# Patient Record
Sex: Male | Born: 1950
Health system: Southern US, Community
[De-identification: ages and names within clinical notes are randomized; demographics above are authoritative.]

## PROBLEM LIST (undated history)

## (undated) DIAGNOSIS — I4891 Unspecified atrial fibrillation: Secondary | ICD-10-CM

## (undated) DIAGNOSIS — G4710 Hypersomnia, unspecified: Secondary | ICD-10-CM

## (undated) DIAGNOSIS — I2699 Other pulmonary embolism without acute cor pulmonale: Secondary | ICD-10-CM

## (undated) DIAGNOSIS — M67919 Unspecified disorder of synovium and tendon, unspecified shoulder: Secondary | ICD-10-CM

## (undated) DIAGNOSIS — Z22322 Carrier or suspected carrier of Methicillin resistant Staphylococcus aureus: Secondary | ICD-10-CM

## (undated) DIAGNOSIS — G4733 Obstructive sleep apnea (adult) (pediatric): Secondary | ICD-10-CM

## (undated) DIAGNOSIS — E039 Hypothyroidism, unspecified: Secondary | ICD-10-CM

## (undated) DIAGNOSIS — Z9289 Personal history of other medical treatment: Secondary | ICD-10-CM

## (undated) DIAGNOSIS — G4730 Sleep apnea, unspecified: Secondary | ICD-10-CM

## (undated) DIAGNOSIS — G2581 Restless legs syndrome: Secondary | ICD-10-CM

## (undated) DIAGNOSIS — L409 Psoriasis, unspecified: Secondary | ICD-10-CM

## (undated) DIAGNOSIS — I82409 Acute embolism and thrombosis of unspecified deep veins of unspecified lower extremity: Secondary | ICD-10-CM

## (undated) DIAGNOSIS — K219 Gastro-esophageal reflux disease without esophagitis: Secondary | ICD-10-CM

## (undated) DIAGNOSIS — I10 Essential (primary) hypertension: Secondary | ICD-10-CM

## (undated) DIAGNOSIS — E782 Mixed hyperlipidemia: Secondary | ICD-10-CM

## (undated) DIAGNOSIS — J449 Chronic obstructive pulmonary disease, unspecified: Secondary | ICD-10-CM

## (undated) DIAGNOSIS — T7840XA Allergy, unspecified, initial encounter: Secondary | ICD-10-CM

## (undated) DIAGNOSIS — K635 Polyp of colon: Secondary | ICD-10-CM

## (undated) DIAGNOSIS — M1990 Unspecified osteoarthritis, unspecified site: Secondary | ICD-10-CM

## (undated) DIAGNOSIS — I219 Acute myocardial infarction, unspecified: Secondary | ICD-10-CM

## (undated) DIAGNOSIS — C4491 Basal cell carcinoma of skin, unspecified: Secondary | ICD-10-CM

## (undated) DIAGNOSIS — N189 Chronic kidney disease, unspecified: Secondary | ICD-10-CM

## (undated) DIAGNOSIS — I639 Cerebral infarction, unspecified: Secondary | ICD-10-CM

## (undated) DIAGNOSIS — J189 Pneumonia, unspecified organism: Secondary | ICD-10-CM

## (undated) DIAGNOSIS — E119 Type 2 diabetes mellitus without complications: Secondary | ICD-10-CM

## (undated) DIAGNOSIS — R519 Headache, unspecified: Secondary | ICD-10-CM

## (undated) DIAGNOSIS — I2510 Atherosclerotic heart disease of native coronary artery without angina pectoris: Secondary | ICD-10-CM

## (undated) DIAGNOSIS — R51 Headache: Secondary | ICD-10-CM

## (undated) DIAGNOSIS — J45909 Unspecified asthma, uncomplicated: Secondary | ICD-10-CM

## (undated) DIAGNOSIS — H269 Unspecified cataract: Secondary | ICD-10-CM

## (undated) DIAGNOSIS — M009 Pyogenic arthritis, unspecified: Secondary | ICD-10-CM

## (undated) DIAGNOSIS — G47419 Narcolepsy without cataplexy: Secondary | ICD-10-CM

## (undated) HISTORY — DX: Chronic kidney disease, unspecified: N18.9

## (undated) HISTORY — DX: Mixed hyperlipidemia: E78.2

## (undated) HISTORY — DX: Unspecified osteoarthritis, unspecified site: M19.90

## (undated) HISTORY — DX: Obstructive sleep apnea (adult) (pediatric): G47.33

## (undated) HISTORY — DX: Pyogenic arthritis, unspecified: M00.9

## (undated) HISTORY — DX: Basal cell carcinoma of skin, unspecified: C44.91

## (undated) HISTORY — DX: Type 2 diabetes mellitus without complications: E11.9

## (undated) HISTORY — DX: Gastro-esophageal reflux disease without esophagitis: K21.9

## (undated) HISTORY — DX: Acute embolism and thrombosis of unspecified deep veins of unspecified lower extremity: I82.409

## (undated) HISTORY — DX: Atherosclerotic heart disease of native coronary artery without angina pectoris: I25.10

## (undated) HISTORY — PX: HIP SURGERY: SHX245

## (undated) HISTORY — PX: LUMBAR DISC SURGERY: SHX700

## (undated) HISTORY — DX: Restless legs syndrome: G25.81

## (undated) HISTORY — PX: COLONOSCOPY: SHX174

## (undated) HISTORY — PX: CYST REMOVAL TRUNK: SHX6283

## (undated) HISTORY — DX: Polyp of colon: K63.5

## (undated) HISTORY — DX: Essential (primary) hypertension: I10

## (undated) HISTORY — PX: BACK SURGERY: SHX140

## (undated) HISTORY — DX: Carrier or suspected carrier of methicillin resistant Staphylococcus aureus: Z22.322

## (undated) HISTORY — PX: KNEE SURGERY: SHX244

## (undated) HISTORY — DX: Unspecified atrial fibrillation: I48.91

## (undated) HISTORY — DX: Unspecified cataract: H26.9

## (undated) HISTORY — PX: TONSILLECTOMY: SUR1361

## (undated) HISTORY — DX: Allergy, unspecified, initial encounter: T78.40XA

## (undated) HISTORY — DX: Acute myocardial infarction, unspecified: I21.9

## (undated) HISTORY — DX: Chronic obstructive pulmonary disease, unspecified: J44.9

## (undated) HISTORY — PX: SPINE SURGERY: SHX786

## (undated) HISTORY — PX: CATARACT EXTRACTION: SUR2

## (undated) HISTORY — DX: Sleep apnea, unspecified: G47.30

## (undated) HISTORY — PX: JOINT REPLACEMENT: SHX530

## (undated) HISTORY — DX: Hypersomnia, unspecified: G47.10

## (undated) HISTORY — DX: Morbid (severe) obesity due to excess calories: E66.01

## (undated) HISTORY — DX: Cerebral infarction, unspecified: I63.9

## (undated) HISTORY — PX: WRIST SURGERY: SHX841

## (undated) HISTORY — DX: Other pulmonary embolism without acute cor pulmonale: I26.99

---

## 1998-11-02 ENCOUNTER — Encounter (INDEPENDENT_AMBULATORY_CARE_PROVIDER_SITE_OTHER): Payer: Self-pay

## 1998-11-02 ENCOUNTER — Ambulatory Visit (HOSPITAL_COMMUNITY): Admission: RE | Admit: 1998-11-02 | Discharge: 1998-11-02 | Payer: Self-pay | Admitting: Gastroenterology

## 2000-05-25 ENCOUNTER — Encounter: Admission: RE | Admit: 2000-05-25 | Discharge: 2000-05-25 | Payer: Self-pay | Admitting: Orthopedic Surgery

## 2000-05-25 ENCOUNTER — Encounter: Payer: Self-pay | Admitting: Orthopedic Surgery

## 2000-08-23 ENCOUNTER — Encounter: Payer: Self-pay | Admitting: Family Medicine

## 2000-08-23 ENCOUNTER — Ambulatory Visit (HOSPITAL_COMMUNITY): Admission: RE | Admit: 2000-08-23 | Discharge: 2000-08-23 | Payer: Self-pay | Admitting: Family Medicine

## 2001-01-02 HISTORY — PX: TOTAL KNEE ARTHROPLASTY: SHX125

## 2001-02-07 ENCOUNTER — Ambulatory Visit (HOSPITAL_COMMUNITY): Admission: RE | Admit: 2001-02-07 | Discharge: 2001-02-07 | Payer: Self-pay | Admitting: Family Medicine

## 2001-02-07 ENCOUNTER — Encounter: Payer: Self-pay | Admitting: Family Medicine

## 2001-04-23 ENCOUNTER — Encounter: Payer: Self-pay | Admitting: *Deleted

## 2001-04-23 ENCOUNTER — Encounter: Admission: RE | Admit: 2001-04-23 | Discharge: 2001-04-23 | Payer: Self-pay | Admitting: *Deleted

## 2001-05-01 ENCOUNTER — Observation Stay (HOSPITAL_COMMUNITY): Admission: RE | Admit: 2001-05-01 | Discharge: 2001-05-02 | Payer: Self-pay | Admitting: Family Medicine

## 2001-05-01 ENCOUNTER — Encounter: Payer: Self-pay | Admitting: Family Medicine

## 2001-05-02 ENCOUNTER — Encounter: Payer: Self-pay | Admitting: Family Medicine

## 2001-07-04 ENCOUNTER — Ambulatory Visit (HOSPITAL_COMMUNITY): Admission: RE | Admit: 2001-07-04 | Discharge: 2001-07-04 | Payer: Self-pay | Admitting: Family Medicine

## 2001-07-04 ENCOUNTER — Encounter: Payer: Self-pay | Admitting: Family Medicine

## 2001-07-19 ENCOUNTER — Inpatient Hospital Stay (HOSPITAL_COMMUNITY): Admission: RE | Admit: 2001-07-19 | Discharge: 2001-07-23 | Payer: Self-pay | Admitting: Specialist

## 2001-07-22 ENCOUNTER — Encounter: Payer: Self-pay | Admitting: Specialist

## 2002-01-02 DIAGNOSIS — I2699 Other pulmonary embolism without acute cor pulmonale: Secondary | ICD-10-CM

## 2002-01-02 HISTORY — DX: Other pulmonary embolism without acute cor pulmonale: I26.99

## 2002-01-31 ENCOUNTER — Inpatient Hospital Stay (HOSPITAL_COMMUNITY): Admission: EM | Admit: 2002-01-31 | Discharge: 2002-02-03 | Payer: Self-pay | Admitting: Specialist

## 2002-02-03 ENCOUNTER — Encounter: Payer: Self-pay | Admitting: Specialist

## 2002-02-04 ENCOUNTER — Encounter (HOSPITAL_COMMUNITY): Admission: RE | Admit: 2002-02-04 | Discharge: 2002-03-06 | Payer: Self-pay | Admitting: Infectious Diseases

## 2002-03-05 ENCOUNTER — Inpatient Hospital Stay (HOSPITAL_COMMUNITY): Admission: EM | Admit: 2002-03-05 | Discharge: 2002-03-07 | Payer: Self-pay | Admitting: Emergency Medicine

## 2002-03-05 ENCOUNTER — Encounter: Payer: Self-pay | Admitting: Emergency Medicine

## 2002-03-07 ENCOUNTER — Encounter (HOSPITAL_COMMUNITY): Admission: RE | Admit: 2002-03-07 | Discharge: 2002-04-06 | Payer: Self-pay | Admitting: Infectious Diseases

## 2002-03-07 ENCOUNTER — Encounter: Payer: Self-pay | Admitting: *Deleted

## 2002-03-13 ENCOUNTER — Encounter: Admission: RE | Admit: 2002-03-13 | Discharge: 2002-03-13 | Payer: Self-pay | Admitting: Infectious Diseases

## 2002-03-18 ENCOUNTER — Ambulatory Visit (HOSPITAL_COMMUNITY): Admission: RE | Admit: 2002-03-18 | Discharge: 2002-03-18 | Payer: Self-pay | Admitting: Infectious Diseases

## 2002-03-19 ENCOUNTER — Encounter: Payer: Self-pay | Admitting: Family Medicine

## 2002-03-19 ENCOUNTER — Inpatient Hospital Stay (HOSPITAL_COMMUNITY): Admission: RE | Admit: 2002-03-19 | Discharge: 2002-03-24 | Payer: Self-pay | Admitting: Family Medicine

## 2002-04-21 ENCOUNTER — Encounter: Admission: RE | Admit: 2002-04-21 | Discharge: 2002-04-21 | Payer: Self-pay | Admitting: Oncology

## 2002-05-12 ENCOUNTER — Ambulatory Visit (HOSPITAL_COMMUNITY): Admission: RE | Admit: 2002-05-12 | Discharge: 2002-05-12 | Payer: Self-pay | Admitting: Specialist

## 2002-05-15 ENCOUNTER — Encounter: Admission: RE | Admit: 2002-05-15 | Discharge: 2002-05-15 | Payer: Self-pay | Admitting: Infectious Diseases

## 2002-05-21 ENCOUNTER — Ambulatory Visit (HOSPITAL_COMMUNITY): Admission: RE | Admit: 2002-05-21 | Discharge: 2002-05-21 | Payer: Self-pay | Admitting: Specialist

## 2002-05-21 ENCOUNTER — Encounter: Payer: Self-pay | Admitting: Specialist

## 2002-05-27 ENCOUNTER — Encounter: Payer: Self-pay | Admitting: Specialist

## 2002-05-27 ENCOUNTER — Ambulatory Visit (HOSPITAL_COMMUNITY): Admission: RE | Admit: 2002-05-27 | Discharge: 2002-05-27 | Payer: Self-pay | Admitting: Specialist

## 2002-08-22 ENCOUNTER — Inpatient Hospital Stay (HOSPITAL_COMMUNITY): Admission: AD | Admit: 2002-08-22 | Discharge: 2002-08-24 | Payer: Self-pay | Admitting: Family Medicine

## 2002-08-22 ENCOUNTER — Encounter: Payer: Self-pay | Admitting: Family Medicine

## 2002-10-09 ENCOUNTER — Encounter: Admission: RE | Admit: 2002-10-09 | Discharge: 2002-10-09 | Payer: Self-pay | Admitting: Infectious Diseases

## 2003-01-03 DIAGNOSIS — I82409 Acute embolism and thrombosis of unspecified deep veins of unspecified lower extremity: Secondary | ICD-10-CM

## 2003-01-03 HISTORY — DX: Acute embolism and thrombosis of unspecified deep veins of unspecified lower extremity: I82.409

## 2003-01-03 HISTORY — PX: TOTAL KNEE REVISION: SHX996

## 2003-04-20 ENCOUNTER — Encounter: Admission: RE | Admit: 2003-04-20 | Discharge: 2003-04-20 | Payer: Self-pay | Admitting: Infectious Diseases

## 2003-06-02 ENCOUNTER — Inpatient Hospital Stay (HOSPITAL_COMMUNITY): Admission: RE | Admit: 2003-06-02 | Discharge: 2003-06-06 | Payer: Self-pay | Admitting: Orthopedic Surgery

## 2003-06-02 ENCOUNTER — Encounter (INDEPENDENT_AMBULATORY_CARE_PROVIDER_SITE_OTHER): Payer: Self-pay | Admitting: *Deleted

## 2003-08-13 ENCOUNTER — Inpatient Hospital Stay (HOSPITAL_COMMUNITY): Admission: RE | Admit: 2003-08-13 | Discharge: 2003-08-17 | Payer: Self-pay | Admitting: Orthopedic Surgery

## 2004-05-30 ENCOUNTER — Emergency Department (HOSPITAL_COMMUNITY): Admission: EM | Admit: 2004-05-30 | Discharge: 2004-05-30 | Payer: Self-pay | Admitting: Emergency Medicine

## 2005-01-03 ENCOUNTER — Ambulatory Visit (HOSPITAL_COMMUNITY): Admission: RE | Admit: 2005-01-03 | Discharge: 2005-01-03 | Payer: Self-pay | Admitting: Family Medicine

## 2005-08-07 ENCOUNTER — Ambulatory Visit: Admission: RE | Admit: 2005-08-07 | Discharge: 2005-08-07 | Payer: Self-pay | Admitting: Family Medicine

## 2005-08-09 ENCOUNTER — Ambulatory Visit: Payer: Self-pay | Admitting: Pulmonary Disease

## 2008-07-22 ENCOUNTER — Inpatient Hospital Stay (HOSPITAL_COMMUNITY): Admission: RE | Admit: 2008-07-22 | Discharge: 2008-07-23 | Payer: Self-pay | Admitting: Neurosurgery

## 2009-11-09 ENCOUNTER — Encounter (HOSPITAL_COMMUNITY)
Admission: RE | Admit: 2009-11-09 | Discharge: 2009-12-09 | Payer: Self-pay | Source: Home / Self Care | Attending: Family Medicine | Admitting: Family Medicine

## 2009-11-30 ENCOUNTER — Ambulatory Visit: Payer: Self-pay | Admitting: Cardiology

## 2009-11-30 ENCOUNTER — Ambulatory Visit (HOSPITAL_COMMUNITY): Admission: RE | Admit: 2009-11-30 | Discharge: 2009-11-30 | Payer: Self-pay | Admitting: Cardiology

## 2009-11-30 DIAGNOSIS — E1165 Type 2 diabetes mellitus with hyperglycemia: Secondary | ICD-10-CM | POA: Insufficient documentation

## 2009-11-30 DIAGNOSIS — E785 Hyperlipidemia, unspecified: Secondary | ICD-10-CM | POA: Insufficient documentation

## 2009-11-30 DIAGNOSIS — I200 Unstable angina: Secondary | ICD-10-CM | POA: Insufficient documentation

## 2009-11-30 LAB — CONVERTED CEMR LAB
Basophils Absolute: 0.1 10*3/uL (ref 0.0–0.1)
Basophils Relative: 1 % (ref 0–1)
CO2: 26 meq/L (ref 19–32)
Calcium: 10.4 mg/dL (ref 8.4–10.5)
Creatinine, Ser: 1.28 mg/dL (ref 0.40–1.50)
Eosinophils Relative: 3 % (ref 0–5)
Glucose, Bld: 113 mg/dL — ABNORMAL HIGH (ref 70–99)
HCT: 41.6 % (ref 39.0–52.0)
Hemoglobin: 13.6 g/dL (ref 13.0–17.0)
MCHC: 32.7 g/dL (ref 30.0–36.0)
MCV: 90.8 fL (ref 78.0–100.0)
Monocytes Absolute: 1.1 10*3/uL — ABNORMAL HIGH (ref 0.1–1.0)
RDW: 13.9 % (ref 11.5–15.5)

## 2009-12-01 ENCOUNTER — Encounter: Payer: Self-pay | Admitting: Cardiology

## 2009-12-03 ENCOUNTER — Ambulatory Visit: Payer: Self-pay | Admitting: Cardiology

## 2009-12-03 ENCOUNTER — Inpatient Hospital Stay (HOSPITAL_BASED_OUTPATIENT_CLINIC_OR_DEPARTMENT_OTHER)
Admission: RE | Admit: 2009-12-03 | Discharge: 2009-12-03 | Payer: Self-pay | Source: Home / Self Care | Admitting: Cardiology

## 2009-12-06 ENCOUNTER — Telehealth: Payer: Self-pay | Admitting: Adult Health

## 2009-12-08 ENCOUNTER — Telehealth (INDEPENDENT_AMBULATORY_CARE_PROVIDER_SITE_OTHER): Payer: Self-pay

## 2009-12-16 ENCOUNTER — Encounter: Payer: Self-pay | Admitting: Cardiology

## 2009-12-16 ENCOUNTER — Ambulatory Visit: Payer: Self-pay | Admitting: Cardiology

## 2009-12-16 ENCOUNTER — Encounter (INDEPENDENT_AMBULATORY_CARE_PROVIDER_SITE_OTHER): Payer: Self-pay | Admitting: *Deleted

## 2009-12-16 DIAGNOSIS — I10 Essential (primary) hypertension: Secondary | ICD-10-CM | POA: Insufficient documentation

## 2009-12-16 DIAGNOSIS — I2510 Atherosclerotic heart disease of native coronary artery without angina pectoris: Secondary | ICD-10-CM | POA: Insufficient documentation

## 2009-12-16 DIAGNOSIS — I25119 Atherosclerotic heart disease of native coronary artery with unspecified angina pectoris: Secondary | ICD-10-CM | POA: Insufficient documentation

## 2009-12-17 ENCOUNTER — Encounter: Payer: Self-pay | Admitting: Cardiology

## 2010-01-12 ENCOUNTER — Telehealth (INDEPENDENT_AMBULATORY_CARE_PROVIDER_SITE_OTHER): Payer: Self-pay

## 2010-01-20 ENCOUNTER — Encounter: Payer: Self-pay | Admitting: Cardiology

## 2010-01-21 ENCOUNTER — Encounter: Payer: Self-pay | Admitting: Cardiology

## 2010-01-21 LAB — CONVERTED CEMR LAB
Calcium: 10.1 mg/dL (ref 8.4–10.5)
Glucose, Bld: 103 mg/dL — ABNORMAL HIGH (ref 70–99)
Potassium: 4.7 meq/L (ref 3.5–5.3)
Sodium: 138 meq/L (ref 135–145)
Total CHOL/HDL Ratio: 3.4

## 2010-01-24 LAB — CONVERTED CEMR LAB
ALT: 21 units/L (ref 0–53)
Bilirubin, Direct: <0.1 mg/dL (ref 0.0–0.3)
Total Bilirubin: 0.3 mg/dL (ref 0.3–1.2)

## 2010-01-25 ENCOUNTER — Encounter: Payer: Self-pay | Admitting: Cardiology

## 2010-02-02 ENCOUNTER — Encounter: Payer: Self-pay | Admitting: Cardiology

## 2010-02-02 ENCOUNTER — Ambulatory Visit: Admit: 2010-02-02 | Payer: Self-pay | Admitting: Cardiology

## 2010-02-02 ENCOUNTER — Ambulatory Visit (INDEPENDENT_AMBULATORY_CARE_PROVIDER_SITE_OTHER): Payer: Medicare Other | Admitting: Cardiology

## 2010-02-02 DIAGNOSIS — I10 Essential (primary) hypertension: Secondary | ICD-10-CM

## 2010-02-02 DIAGNOSIS — E782 Mixed hyperlipidemia: Secondary | ICD-10-CM

## 2010-02-02 DIAGNOSIS — I2510 Atherosclerotic heart disease of native coronary artery without angina pectoris: Secondary | ICD-10-CM

## 2010-02-03 NOTE — Letter (Signed)
Summary: [place] [facility] Work Engineer, agricultural at [employer]  [address], [place] [address]   Phone: [phone]  Fax: [phone]     December 16, 2009 MRN: [id number]   Paul Baker [address] [place], [place]  [address]      YOUR LAB WORK IS DUE   January 20, 2010  Please go to [facility], located across the street from [facility] on the second floor.  Hours are Monday - Friday 7am until 7:30pm         Saturday 8am until 12noon    _X_  DO NOT EAT OR DRINK AFTER MIDNIGHT EVENING PRIOR TO LABWORK

## 2010-02-03 NOTE — Letter (Signed)
Summary: [facility]  [facility]   Imported By: Faythe Ghee 12/01/2009 11:01:26  _____________________________________________________________________  External Attachment:    Type:   Image     Comment:   External Document

## 2010-02-03 NOTE — Progress Notes (Signed)
**Note De-Identified Bich Mchaney Obfuscation** Summary: samples of crestor  Phone Note Call from Patient Call back at Home Phone [phone]   Caller: pt Reason for Call: Talk to Nurse Summary of Call: pt would like samples of crestor Initial call taken by: Faythe Ghee,  January 12, 2010 2:09 PM  Follow-up for Phone Call        Left message for pt. to come by office to pick up samples of 5mg  Crestor. Follow-up by: Larita Fife Miriam Liles LPN,  January 12, 2010 4:38 PM    Prescriptions: CRESTOR 5 MG TABS (ROSUVASTATIN CALCIUM) Take one tablet by mouth daily.  #42 x 0   Entered by:   Larita Fife Lonita Debes LPN   Authorized by:   Loreli Slot, MD, [facility]   Signed by:   Larita Fife Efe Fazzino LPN on 09/81/1914   Method used:   Samples Given   RxID:   [id number]  Lot # = [id number]   EXP= 10/13

## 2010-02-03 NOTE — Letter (Signed)
Summary: REFERRAL FORM  REFERRAL FORM   Imported By: Faythe Ghee 12/17/2009 14:44:45  _____________________________________________________________________  External Attachment:    Type:   Image     Comment:   External Document

## 2010-02-03 NOTE — Assessment & Plan Note (Signed)
Summary: eph post cath   Visit Type:  Follow-up Primary Provider:  Dr. Simone Curia   History of Present Illness: 60 year old male presents for followup. He was seen in consultation back in late November for further review and abnormal stress test, documented previously. This study showed subtle inferior ischemia with normal LVEF. He was referred for a diagnostic cardiac catheterization, reviewed below. He was found to have a diseased, nondominant RCA, which was felt to be best managed medically initially. He was started on Imdur and Crestor, and verapamil was discontinued in favor of Lopressor.  He is here for followup with his wife. He reports doing reasonably well. Still with shortness of breath on exertion, and occasional chest pain. Symptoms are better than before. We reviewed his cardiac catheterization report. We also discussed referral to cardiac rehabilitation. He is tolerating Crestor.  He seems motivated to increase his activity and lose weight. Also comfortable continuing on medical therapy at this point for CAD.  Current Medications (verified): 1)  Tricor 145 Mg Tabs (Fenofibrate) .... Take 1 Tab Daily 2)  Triamterene-Hctz 37.5-25 Mg Tabs (Triamterene-Hctz) .... Take 1 Tab Daily 3)  Losartan Potassium 100 Mg Tabs (Losartan Potassium) .... Take 1 Tab Daily 4)  Glyburide 5 Mg Tabs (Glyburide) .... Take 1 Tab Daily 5)  Requip 4 Mg Tabs (Ropinirole Hcl) .... Take 1 Tab At Bedtime 6)  Metformin Hcl 500 Mg Tabs (Metformin Hcl) .... 2 Tabs Am 1 Tab Noon Time 2 Tabs At Night 7)  Testosterone Injection .[name].. 1 Shot Every 2 Weeks 8)  Levitra 20 Mg Tabs (Vardenafil Hcl) .... Use As Needed 9)  Aspir-Low 81 Mg Tbec (Aspirin) .... Take 1 Tab Daily 10)  Fish Oil 1000 Mg Caps (Omega-3 Fatty Acids) .... Take 1 Cap Daily 11)  Biotin 1000 Mcg Tabs (Biotin) .... Take 1 Tab Daily 12)  Coenzyme Q10 200 Mg Tabs (Coenzyme Q10) .... Take 1 Tab Daily 13)  Vitamin C Cr 500 Mg Cr-Caps (Ascorbic Acid)  .... Take 1 Tab Daily 14)  Multivitamins  Tabs (Multiple Vitamin) .... Take 1 Tab Daily 15)  Vitamin D3 1000 Unit Caps (Cholecalciferol) .... Take 1 Tab Daily 16)  Nitroglycerin 0.2 Mg/hr Pt24 (Nitroglycerin) .... Apply 1 Patch Each Morning and Remove At Bedtime 17)  Crestor 5 Mg Tabs (Rosuvastatin Calcium) .... Take One Tablet By Mouth Daily. 18)  Imdur 30 Mg Xr24h-Tab (Isosorbide Mononitrate) .... Take 1 Tab Daily 19)  Lopressor 50 Mg Tabs (Metoprolol Tartrate) .... Take 1 Tab Two Times A Day  Allergies (verified): 1)  ! Codeine  Comments:  Nurse/Medical Assistant: patient brought med list they took patient off of zetia and verapamil stared on crestor 5mg  ,metoprolol 50 mg two times a day and imdur 30mg  [place]  Past History:  Past Medical History: Last updated: 11/30/2009 Arthritis Diabetes Type 2 Hyperlipidemia Hypertension Septic arthritis of left knee History of DVT and pulmonary embolus Severe obstructive sleep apnea based on sleep study 2007  Social History: Last updated: 11/30/2009 Disabled  Married  Tobacco Use - Former.  Alcohol Use - no  Review of Systems       The patient complains of dyspnea on exertion.  The patient denies anorexia, fever, syncope, peripheral edema, prolonged cough, abdominal pain, melena, hematochezia, and severe indigestion/heartburn.         Otherwise reviewed and negative except as outlined.  Vital Signs:  Patient profile:   60 year old male Weight:      237 pounds BMI:  40.83 O2 Sat:      97 % on Room air Pulse rate:   83 / minute BP sitting:   129 / 65  (right arm)  Vitals Entered By: Dreama Saa, CNA (December 16, 2009 2:50 PM)  O2 Flow:  Room air  Physical Exam  Additional Exam:  Obese male in no acute distress without active chest pain. HEENT: Conjunctiva and lids normal, oropharynx with moist mucosa. Neck: Supple, no elevated JVP or carotid bruits, no thyromegaly. Lungs: Clear to auscultation, nonlabored,  no wheezing. Cardiac: Regular rate and rhythm, no S3 gallop or pericardial rub, no significant systolic murmur. Abdomen: Soft, nontender, bowel sounds present. Extremities: Trace to 1+ edema below the knees bilaterally, distal pulses one plus. Musculoskeletal: No gross deformities. Left knee status post joint replacement with well-healed incision. Skin: Warm and dry. Neuropsychiatric: Alert and oriented x3, affect appropriate.   Cardiac Cath  Procedure date:  12/03/2009  Findings:      FINDINGS: 1. Hemodynamics:  Aorta 113/83.  LV 127/25. 2. Left ventriculography:  EF was estimated to be 55%.  The basal     inferior wall did appear mildly hypokinetic. 3. Right coronary artery:  The right coronary artery was a small and     nondominant vessel.  There was a 90% discrete proximal RCA stenosis     right at the site of the takeoff of an acute marginal vessel.  The     remainder of the RCA had no significant disease. 4. Left main:  The left main was short, but no significant disease. 5. Left circumflex system:  Left circumflex system was dominant.     There was a moderate-sized ramus with luminal irregularities.     Circumflex itself gave rise to a large PLOM that actually did     course around and supplied most of the PDA territory.  This had no     significant disease. 6. LAD system:  There was about 20% proximal LAD stenosis.  There were     very mild luminal irregularities in the rest of the vessel.  EKG  Procedure date:  12/16/2009  Findings:      Normal sinus rhythm, small inferior Q. Not clearly diagnostic.  Impression & Recommendations:  Problem # 1:  CORONARY ATHEROSCLEROSIS NATIVE CORONARY ARTERY (ICD-414.01)  Obstructive disease involving a small nondominant RCA, consistent with the mild abnormality noted on Myoview, and potentially symptom provoking. Would agree with trial of medical therapy, as percutaneous intervention would likely be more complicated. He is  comfortable with this approach. Plan referral to cardiac rehabilitation. I have encouraged weight loss. He reports compliance with his medications, tolerating them so far. I will see him back over the next 6 weeks.  His updated medication list for this problem includes:    Aspir-low 81 Mg Tbec (Aspirin) .[name]... Take 1 tab daily    Nitroglycerin 0.2 Mg/hr Pt24 (Nitroglycerin) .[name]... Apply 1 patch each morning and remove at bedtime    Imdur 30 Mg Xr24h-tab (Isosorbide mononitrate) .[name]... Take 1 tab daily    Lopressor 50 Mg Tabs (Metoprolol tartrate) .[name]... Take 1 tab two times a day  Problem # 2:  HYPERLIPIDEMIA (ICD-272.4)  Tolerating Crestor. Check followup fasting lipid profile and liver function tests for his next visit. Aiming for aggressive LDL control.  His updated medication list for this problem includes:    Tricor 145 Mg Tabs (Fenofibrate) .[name]... Take 1 tab daily    Crestor 5 Mg Tabs (Rosuvastatin calcium) .[name]... Take one  tablet by mouth daily.  His updated medication list for this problem includes:    Tricor 145 Mg Tabs (Fenofibrate) .[name]... Take 1 tab daily    Crestor 5 Mg Tabs (Rosuvastatin calcium) .[name]... Take one tablet by mouth daily.  Problem # 3:  ESSENTIAL HYPERTENSION, BENIGN (ICD-401.1)  Blood pressure well controlled today.  His updated medication list for this problem includes:    Triamterene-hctz 37.5-25 Mg Tabs (Triamterene-hctz) .[name]... Take 1 tab daily    Losartan Potassium 100 Mg Tabs (Losartan potassium) .[name]... Take 1 tab daily    Aspir-low 81 Mg Tbec (Aspirin) .[name]... Take 1 tab daily    Lopressor 50 Mg Tabs (Metoprolol tartrate) .[name]... Take 1 tab two times a day  His updated medication list for this problem includes:    Triamterene-hctz 37.5-25 Mg Tabs (Triamterene-hctz) .[name]... Take 1 tab daily    Losartan Potassium 100 Mg Tabs (Losartan potassium) .[name]... Take 1 tab daily    Aspir-low 81 Mg Tbec (Aspirin) .[name]... Take 1 tab daily    Lopressor 50 Mg Tabs (Metoprolol  tartrate) .[name]... Take 1 tab two times a day  Problem # 4:  DM (ICD-250.00)  Continue followup with Dr. [name].  His updated medication list for this problem includes:    Losartan Potassium 100 Mg Tabs (Losartan potassium) .[name]... Take 1 tab daily    Glyburide 5 Mg Tabs (Glyburide) .[name]... Take 1 tab daily    Metformin Hcl 500 Mg Tabs (Metformin hcl) .[name]... 2 tabs am 1 tab noon time 2 tabs at night    Aspir-low 81 Mg Tbec (Aspirin) .[name]... Take 1 tab daily  His updated medication list for this problem includes:    Losartan Potassium 100 Mg Tabs (Losartan potassium) .[name]... Take 1 tab daily    Glyburide 5 Mg Tabs (Glyburide) .[name]... Take 1 tab daily    Metformin Hcl 500 Mg Tabs (Metformin hcl) .[name]... 2 tabs am 1 tab noon time 2 tabs at night    Aspir-low 81 Mg Tbec (Aspirin) .[name]... Take 1 tab daily  Other Orders: Future Orders: T-Lipid Profile ([id number]) ... 01/20/2010 T-Renal Function Panel [id number]) ... 01/20/2010  Patient Instructions: 1)  Your physician recommends that you schedule a follow-up appointment in: 6 weeks 2)  Your physician recommends that you return for lab work in: 5 weeks 3)  Your physician recommends that you continue on your current medications as directed. Please refer to the Current Medication list given to you today. 4)  Your physician recommends referral and attendance at a Cardiac Rehab Program.

## 2010-02-03 NOTE — Letter (Signed)
Summary: [place] Results Engineer, agricultural at [facility]  [address], [place] [address]   Phone: [phone]  Fax: [phone]      January 25, 2010 MRN: [id number]   KYLER GERMER [address] [place], [place]  [address]   Dear Mr. MOHLER,  Your test ordered by Selena Batten has been reviewed by your physician (or physician assistant) and was found to be normal or stable. Your physician (or physician assistant) felt no changes were needed at this time.  ____ Echocardiogram  ____ Cardiac Stress Test  __X__ Lab Work  ____ Peripheral vascular study of arms, legs or neck  ____ CT scan or X-ray  ____ Lung or Breathing test  ____ Other:  Please continue on current medical treatment.  Thank you.   Nona Dell, MD, F.A.C.C

## 2010-02-03 NOTE — Letter (Signed)
Summary: Cardiac Catheterization Instructions- [facility]   [facility]  [address] [place] [address]   Phone: [phone]  Fax: [phone]     11/30/2009 MRN: [id number]  Paul Baker [address] [place], [place]  [address]  Dear [name],   You are scheduled for a Cardiac Catheterization on ___12-2-11______ with Dr.__McLean____________  Please arrive to the 1st floor of the [facility] and V[facility] Center at [facility] at _9:30____ am on the day of your procedure. Please do not arrive before 6:30 a.m. Call the Heart and Vascular Center at [phone] if you are unable to make your appointmnet. The Code to get into the parking garage under the building is__0003______. Take the elevators to the 1st floor. You must have someone to drive you home. Someone must be with you for the first 24 hours after you arrive home. Please wear clothes that are easy to get on and off and wear slip-on shoes. Do not eat or drink after midnight except water with your medications that morning. Bring all your medications and current insurance cards with you.  ___ DO NOT take these medications before your procedure: Triamterene, Glyburide, and Metformin ________________________________________________________________  _X__ Make sure you take your aspirin.  ___ You may take ALL of your medications with water that morning. ________________________________________________________________________________________________________________________________  ___ DO NOT take ANY medications before your procedure.  ___ Pre-med instructions:  ________________________________________________________________________________________________________________________________  The usual length of stay after your procedure is 2 to 3 hours. This can vary.  If you have any questions, please call the office at the number listed above.   Larita Fife Via LPN

## 2010-02-03 NOTE — Progress Notes (Signed)
Summary: PT WAS TOLD TO START ISOSORIBIDE BUT ITS ON BACK ORDER  Phone Note Call from Patient Call back at Home Phone [phone]   Caller: PT Reason for Call: Talk to Nurse Summary of Call: PT HAD CATH LAST FRIDAY AND DR [facility] PUT HIM ON ISOSORIBIDE. THIS DRUG IS ON BACK ORDER WORLD WIDE AND NEEDS TO KNOW WHAT HE NEEDS TO DO. Initial call taken by: Faythe Ghee,  December 06, 2009 12:42 PM  Follow-up for Phone Call         Please advise. Follow-up by: Larita Fife Via LPN,  December 06, 2009 1:37 PM  Additional Follow-up for Phone Call Additional follow up Details #1::        Nitrodur Patch of 0.2mg /hr a day and change daily. Call in #30, to use until isosorbide is available. Additional Follow-up by: Joni Reining NP     Appended Document: PT WAS TOLD TO START ISOSORIBIDE BUT ITS ON BACK ORDER    Clinical Lists Changes  Medications: Added new medication of NITROGLYCERIN 0.2 MG/HR PT24 (NITROGLYCERIN) Apply 1 patch each morning and remove at bedtime - Signed Rx of NITROGLYCERIN 0.2 MG/HR PT24 (NITROGLYCERIN) Apply 1 patch each morning and remove at bedtime;  #30 x 0;  Signed;  Entered by: Larita Fife Via LPN;  Authorized by: Joni Reining, NP;  Method used: Electronically to [facility] # [phone]*, [address] [place], [place]  [address], Ph: [phone], Fax: [phone]    Prescriptions: NITROGLYCERIN 0.2 MG/HR PT24 (NITROGLYCERIN) Apply 1 patch each morning and remove at bedtime  #30 x 0   Entered by:   Larita Fife Via LPN   Authorized by:   Joni Reining, NP   Signed by:   Larita Fife Via LPN on 40/34/7425   Method used:   Electronically to        [facility] # [id number]* (retail)       [address]       [place], [place]  [address]       Ph: [phone]       Fax: [phone]   RxID:   [id number]

## 2010-02-03 NOTE — Progress Notes (Signed)
Summary: Medication Questions  Phone Note Call from Patient   Caller: Patient Reason for Call: Talk to Nurse Summary of Call: patient would like to know if it will be okay to have Testosterone injection with him being on the meds that's he's on now / tg Initial call taken by: Raechel Ache [facility],  December 08, 2009 2:24 PM  Follow-up for Phone Call        Pt. advised that it is ok to have testosterone injections as Dr. Diona Browner is aware of his medications. Also, pt. advised to show med list to Dr. Artis Flock at tomorrow's appt. Follow-up by: Larita Fife Via LPN,  December 08, 2009 4:22 PM

## 2010-02-03 NOTE — Assessment & Plan Note (Signed)
Summary: **NP6 CP AND IRREGULAR STRESS TEST   Visit Type:  Initial Consult Primary [place] Whalin:  Dr. Simone Curia   History of Present Illness: 60 year old male referred for cardiology consultation. He was seen in the past by Dr. Dorethea Clan.  He has no clearly documented history of obstructive CAD or myocardial infarction. Past medical history and cardiac risk factors are discussed below. He reports a 6-8 week history of progressive shortness of breath and fatigue with exertion, occasional spells of diaphoresis, and also intermittent dull chest pain. Symptoms occur with exertion, but not exclusively. He can have symptoms lasting for minutes to as long as 2 hours at a time. He is also noted more wheezing recently on exertion.  He was referred for a Myoview by Dr. [name], outlined below, which reports a subtle area of potential inferior ischemia with LVEF of 56%. Previous stress tests were reviewed, all of which have been normal over time.  Fax copy of ECG done recently shows sinus rhythm with leftward axis, otherwise no acute ST segment changes.  Today we reviewed his recent stress test results, as well as his symptoms and cardiac risk factor profile. He is very concerned about the status of his heart in light of family history of premature cardiovascular disease. We discussed further avenues for testing, including the risks and benefits of diagnostic cardiac catheterization to best understand his coronary anatomy and assess for any potential revascularization strategies. After our review, and answering his questions, he is in agreement to proceed.  Preventive Screening-Counseling & Management  Alcohol-Tobacco     Smoking Status: quit  Current Medications (verified): 1)  Tricor 145 Mg Tabs (Fenofibrate) .... Take 1 Tab Daily 2)  Triamterene-Hctz 37.5-25 Mg Tabs (Triamterene-Hctz) .... Take 1 Tab Daily 3)  Zetia 10 Mg Tabs (Ezetimibe) .... Take 1 Tab Daily 4)  Losartan Potassium 100 Mg Tabs  (Losartan Potassium) .... Take 1 Tab Daily 5)  Glyburide 5 Mg Tabs (Glyburide) .... Take 1 Tab Daily 6)  Calan Sr 180 Mg Cr-Tabs (Verapamil Hcl) .... Take 1 Tab Daily 7)  Requip 4 Mg Tabs (Ropinirole Hcl) .... Take 1 Tab At Bedtime 8)  Metformin Hcl 500 Mg Tabs (Metformin Hcl) .... 2 Tabs Am 1 Tab Noon Time 2 Tabs At Night 9)  Testosterone Injection .[name].. 1 Shot Every 2 Weeks 10)  Levitra 20 Mg Tabs (Vardenafil Hcl) .... Use As Needed 11)  Aspir-Low 81 Mg Tbec (Aspirin) .... Take 1 Tab Daily 12)  Fish Oil 1000 Mg Caps (Omega-3 Fatty Acids) .... Take 1 Cap Daily 13)  Biotin 1000 Mcg Tabs (Biotin) .... Take 1 Tab Daily 14)  Coenzyme Q10 200 Mg Tabs (Coenzyme Q10) .... Take 1 Tab Daily 15)  Vitamin C Cr 500 Mg Cr-Caps (Ascorbic Acid) .... Take 1 Tab Daily 16)  Multivitamins  Tabs (Multiple Vitamin) .... Take 1 Tab Daily 17)  Vitamin D3 1000 Unit Caps (Cholecalciferol) .... Take 1 Tab Daily  Allergies (verified): 1)  ! Codeine  Comments:  Nurse/Medical Assistant: patient is new he brought med list of all meds and vit  Past History:  Family History: Last updated: 11/30/2009 Father: hypertension, cva, arthritis Mother: hypertension, liver cancer Family History of Coronary Artery Disease  Social History: Last updated: 11/30/2009 Disabled  Married  Tobacco Use - Former.  Alcohol Use - no  Past Medical History: Arthritis Diabetes Type 2 Hyperlipidemia Hypertension Septic arthritis of left knee History of DVT and pulmonary embolus Severe obstructive sleep apnea based on sleep study 2007  Past  Surgical History: Left total knee replacement with reimplantation Left L3 L4-L5 discectomy with decompression of L4 root Wrist surgery Restless leg syndrome  Family History: Father: hypertension, cva, arthritis Mother: hypertension, liver cancer Family History of Coronary Artery Disease  Social History: Disabled  Married  Tobacco Use - Former.  Alcohol Use - no Smoking Status:   quit  Review of Systems       The patient complains of chest pain and dyspnea on exertion.  The patient denies anorexia, fever, weight loss, syncope, peripheral edema, prolonged cough, headaches, hemoptysis, abdominal pain, melena, and hematochezia.         States that blood pressure has been trending higher recently. Patient disabled related to orthopedic problems. Otherwise reviewed and negative.  Vital Signs:  Patient profile:   60 year old male Height:      64 inches Weight:      236 pounds BMI:     40.66 Pulse rate:   98 / minute BP sitting:   153 / 81  (right arm)  Vitals Entered By: Dreama Saa, CNA (November 30, 2009 2:24 PM)  Physical Exam  Additional Exam:  Obese male in no acute distress without active chest pain. HEENT: Conjunctiva and lids normal, oropharynx with moist mucosa. Neck: Supple, no elevated JVP or carotid bruits, no thyromegaly. Lungs: Clear to auscultation, nonlabored, no wheezing. Cardiac: Regular rate and rhythm, no S3 gallop or pericardial rub, no significant systolic murmur. Abdomen: Soft, nontender, bowel sounds present. Extremities: Trace to 1+ edema below the knees bilaterally, distal pulses one plus. Musculoskeletal: No gross deformities. Left knee status post joint replacement with well-healed incision. Skin: Warm and dry. Neuropsychiatric: Alert and oriented x3, affect appropriate.   Nuclear Study  Procedure date:  11/17/2009  Findings:       Findings:   Myocardial perfusion SPECT images obtained following pharmacologic   stress subjectively reveal a focus of diminished perfusion in the   inferior wall left ventricle.   Resting exam shows partial reperfusion of this site.   Quantitative analysis does not definitely visualize this defect.   No pulmonary uptake of tracer.   Normal left ventricular ejection fraction of 56% calculated on   gated SPECT images following pharmacologic stress.   This is derived from end-diastolic volume  calculation of 111 ml and   end-systolic volume of 48 ml.   Normal left ventricular wall motion is identified on cine analysis   of the gated SPECT images obtained following pharmacologic stress.    IMPRESSION:   Subtle area of Lexiscan induced partially reversible decreased   myocardial perfusion involving the inferior wall left ventricle.   Normal left ventricular ejection fraction of 56%.   Normal left ventricular wall motion.   EKG  Procedure date:  11/30/2009  Findings:      Normal sinus rhythm and 91 beats per minute.  Impression & Recommendations:  Problem # 1:  ANGINA, UNSTABLE (ICD-411.1)  Symptoms as described above, concerning for possible unstable angina, and associated with recent mildly abnormal Myoview. Cardiac risk factors include family history, hypertension, hyperlipidemia, type 2 diabetes mellitus, and gender. He quit smoking in the past.  Resting ECG is normal today. After reviewing the risk-benefit profile, plan is to proceed with a diagnostic outpatient cardiac catheterization to best define his coronary anatomy and determine if there are any revascularization options to consider beyond medical therapy. Plan to continue present medical therapy, holding metformin, glyburide, and hydrochlorothiazide for the procedure. Baseline labs and chest x-ray will be obtained.  Of note, he does mention concerns about a previous left knee prosthetic infection requiring reimplantation. In general, periprocedural antibiotics are not considered for a diagnostic cardiac catheterization, and at this point no specific device implantation is anticipated. I discussed this with him.  The following medications were removed from the medication list:    Coumadin 5 Mg Tabs (Warfarin sodium) .[name]... Take as directed by coumadin clinic His updated medication list for this problem includes:    Calan Sr 180 Mg Cr-tabs (Verapamil hcl) .[name]... Take 1 tab daily    Aspir-low 81 Mg Tbec (Aspirin) .[name]... Take  1 tab daily  Orders: Cardiac Catheterization (Cardiac Cath) T-Chest x-ray, 1 view ([id number])  Problem # 2:  CARDIOVASCULAR FUNCTION STUDY, ABNORMAL (ICD-794.30)  As noted above, question of potentially mild area of inferior ischemia with normal LVEF. This is to be investigated further. Could certainly be related to soft tissue attenuation in this region, however active progressive symptomatology argues for more aggressive evaluation.  Problem # 3:  DM (ICD-250.00)  Followed by Dr.Luking. As noted, need to hold glyburide and metformin for the procedure.  His updated medication list for this problem includes:    Losartan Potassium 100 Mg Tabs (Losartan potassium) .[name]... Take 1 tab daily    Glyburide 5 Mg Tabs (Glyburide) .[name]... Take 1 tab daily    Metformin Hcl 500 Mg Tabs (Metformin hcl) .[name]... 2 tabs am 1 tab noon time 2 tabs at night    Aspir-low 81 Mg Tbec (Aspirin) .[name]... Take 1 tab daily  Problem # 4:  HYPERLIPIDEMIA (ICD-272.4)  Followed by Dr.Luking. Continue on present regimen for now.  His updated medication list for this problem includes:    Tricor 145 Mg Tabs (Fenofibrate) .[name]... Take 1 tab daily    Zetia 10 Mg Tabs (Ezetimibe) .[name]... Take 1 tab daily  Other Orders: T-Basic Metabolic Panel [phone]) T-CBC w/Diff [phone]) T-Protime, Auto [id number]) T-PTT [phone])  Patient Instructions: 1)  Your physician recommends that you schedule a follow-up appointment in: after cath 2)  Your physician recommends that you return for lab work in: today 3)  Your physician recommends that you continue on your current medications as directed. Please refer to the Current Medication list given to you today. 4)  A chest x-ray takes a picture of the organs and structures inside the chest, including the heart, lungs, and blood vessels. This test can show several things, including, whether the heart is enlarged; whether fluid is building up in the lungs; and whether pacemaker /  defibrillator leads are still in place. 5)  Your physician has requested that you have a cardiac catheterization.  Cardiac catheterization is used to diagnose and/or treat various heart conditions. Doctors may recommend this procedure for a number of different reasons. The most common reason is to evaluate chest pain. Chest pain can be a symptom of coronary artery disease (CAD), and cardiac catheterization can show whether plaque is narrowing or blocking your heart's arteries. This procedure is also used to evaluate the valves, as well as measure the blood flow and oxygen levels in different parts of your heart.  For further information please visit [email or web address]  Please follow instruction sheet, as given.

## 2010-02-09 NOTE — Assessment & Plan Note (Signed)
Summary: 6 wk f/u per checkout on 12/16/09/tg/tg   Vital Signs:  Patient profile:   60 year old male Weight:      241 pounds BMI:     41.52 Pulse rate:   78 / minute BP sitting:   146 / 81  (left arm)  Vitals Entered By: Dreama Saa, CNA (February 02, 2010 8:26 AM)  Visit Type:  Follow-up Primary Provider:  Dr. Simone Curia   History of Present Illness: 60 year old male presents for followup. He was seen back in December 2011.  He reports occasional chest pain, although no significant use of sublingual nitroglycerin. He reports compliance with medications as well. He has not yet started cardiac rehabilitation, likely next week.  He does complain of a generally tight feeling, like swelling in his hands and arms, sometimes face and eyes. Not entirely clear that this is medication related. Crestor, Lopressor, and iImdur are his newest medications.  Followup labs from January showed AST 16, ALT 21, cholesterol 134, triglycerides 225, HDL 39, LDL 50, potassium 4.7, BUN 25, creatinine 1.4. We discussed these today.  Levitra was noted as a p.r.n. medication on his list, although he states that he is not taking it. I made it clear that he should not use this medicine while being on Imdur.  We again discussed diet, weight loss, and exercise.  ECG shows sinus rhythm at 73 beats per minute with small R prime in lead V1.  Clinical Review Panels:  Cardiac Imaging Cardiac Cath Findings FINDINGS: 1. Hemodynamics:  Aorta 113/83.  LV 127/25. 2. Left ventriculography:  EF was estimated to be 55%.  The basal     inferior wall did appear mildly hypokinetic. 3. Right coronary artery:  The right coronary artery was a small and     nondominant vessel.  There was a 90% discrete proximal RCA stenosis     right at the site of the takeoff of an acute marginal vessel.  The     remainder of the RCA had no significant disease. 4. Left main:  The left main was short, but no significant disease. 5.  Left circumflex system:  Left circumflex system was dominant.     There was a moderate-sized ramus with luminal irregularities.     Circumflex itself gave rise to a large PLOM that actually did     course around and supplied most of the PDA territory.  This had no     significant disease. 6. LAD system:  There was about 20% proximal LAD stenosis.  There were     very mild luminal irregularities in the rest of the vessel. (12/03/2009)   Current Medications (verified): 1)  Tricor 145 Mg Tabs (Fenofibrate) .... Take 1 Tab Daily 2)  Triamterene-Hctz 37.5-25 Mg Tabs (Triamterene-Hctz) .... Take 1 Tab Daily 3)  Losartan Potassium 100 Mg Tabs (Losartan Potassium) .... Take 1 Tab Daily 4)  Glyburide 5 Mg Tabs (Glyburide) .... Take 1 Tab Daily 5)  Requip 4 Mg Tabs (Ropinirole Hcl) .... Take 1 Tab At Bedtime 6)  Metformin Hcl 500 Mg Tabs (Metformin Hcl) .... 2 Tabs Am 1 Tab Noon Time 2 Tabs At Night 7)  Testosterone Injection .[name].. 1 Shot Every 2 Weeks 8)  Aspir-Low 81 Mg Tbec (Aspirin) .... Take 1 Tab Daily 9)  Fish Oil 1000 Mg Caps (Omega-3 Fatty Acids) .... Take 1 Cap Daily 10)  Biotin 1000 Mcg Tabs (Biotin) .... Take 1 Tab Daily 11)  Coenzyme Q10 200 Mg Tabs (Coenzyme Q10) .Marland KitchenMarland KitchenMarland Kitchen  Take 1 Tab Daily 12)  Vitamin C Cr 500 Mg Cr-Caps (Ascorbic Acid) .... Take 1 Tab Daily 13)  Multivitamins  Tabs (Multiple Vitamin) .... Take 1 Tab Daily 14)  Vitamin D3 1000 Unit Caps (Cholecalciferol) .... Take 1 Tab Daily 15)  Nitrostat 0.4 Mg Subl (Nitroglycerin) .... Use As Directed For Chest Pain 16)  Crestor 5 Mg Tabs (Rosuvastatin Calcium) .... Take One Tablet By Mouth Daily. 17)  Imdur 30 Mg Xr24h-Tab (Isosorbide Mononitrate) .... Take 1 Tab Daily 18)  Lopressor 50 Mg Tabs (Metoprolol Tartrate) .... Take 1 Tab Two Times A Day  Allergies (verified): 1)  ! Codeine  Comments:  Nurse/Medical Assistant: patient brought med list reviewed with patient  the only change is he has went  to nitro tabs and not  patches any longer sams in [place]  Past History:  Social History: Last updated: 11/30/2009 Disabled  Married  Tobacco Use - Former.  Alcohol Use - no  Past Medical History: Arthritis Diabetes Type 2 Hyperlipidemia Hypertension Septic arthritis of left knee History of DVT and pulmonary embolus Severe obstructive sleep apnea based on sleep study 2007 CAD - diseased nondominant RCA  Review of Systems  The patient denies anorexia, fever, weight loss, syncope, dyspnea on exertion, peripheral edema, prolonged cough, melena, hematochezia, and severe indigestion/heartburn.         Otherwise reviewed and negative except as outlined.  Physical Exam  Additional Exam:  Obese male in no acute distress without active chest pain. HEENT: Conjunctiva and lids normal, oropharynx with moist mucosa. Neck: Supple, no elevated JVP or carotid bruits, no thyromegaly. Lungs: Clear to auscultation, nonlabored, no wheezing. Cardiac: Regular rate and rhythm, no S3 gallop or pericardial rub, no significant systolic murmur. Abdomen: Soft, nontender, bowel sounds present. Extremities: Trace to 1+ edema below the knees bilaterally, distal pulses one plus, somewhat stiff digits and arthritic changes. Musculoskeletal: No gross deformities. Left knee status post joint replacement with well-healed incision. Skin: Warm and dry. Neuropsychiatric: Alert and oriented x3, affect appropriate.   New Orders:     1)  T-Lipid Profile [id number])  Due: 05/02/2010     2)  T-Hepatic Function [id number])  Due: 05/02/2010   Impression & Recommendations:  Problem # 1:  CORONARY ATHEROSCLEROSIS NATIVE CORONARY ARTERY (ICD-414.01)  Relatively stable on medical therapy. I encouraged him to pursue cardiac rehabilitation, also discussed diet and weight loss. Plan to see him back over the next 3 months.  His updated medication list for this problem includes:    Aspir-low 81 Mg Tbec (Aspirin) .[name]... Take 1 tab  daily    Nitrostat 0.4 Mg Subl (Nitroglycerin) ..... Use as directed for chest pain    Imdur 30 Mg Xr24h-tab (Isosorbide mononitrate) .[name]... Take 1 tab daily    Lopressor 50 Mg Tabs (Metoprolol tartrate) .[name]... Take 1 tab two times a day  Problem # 2:  ESSENTIAL HYPERTENSION, BENIGN (ICD-401.1)  Continue to work on diet, weight loss, and exercise.  His updated medication list for this problem includes:    Triamterene-hctz 37.5-25 Mg Tabs (Triamterene-hctz) .[name]... Take 1 tab daily    Losartan Potassium 100 Mg Tabs (Losartan potassium) .[name]... Take 1 tab daily    Aspir-low 81 Mg Tbec (Aspirin) .[name]... Take 1 tab daily    Lopressor 50 Mg Tabs (Metoprolol tartrate) .[name]... Take 1 tab two times a day  Problem # 3:  HYPERLIPIDEMIA (ICD-272.4)  LDL looks good. Elevated triglycerides noted in the setting of diabetes mellitus and obesity.  His updated medication  list for this problem includes:    Tricor 145 Mg Tabs (Fenofibrate) .[name]... Take 1 tab daily    Crestor 5 Mg Tabs (Rosuvastatin calcium) .[name]... Take one tablet by mouth daily.  Problem # 4:  DM (ICD-250.00)  Continue followup with Dr. [name].  His updated medication list for this problem includes:    Losartan Potassium 100 Mg Tabs (Losartan potassium) .[name]... Take 1 tab daily    Glyburide 5 Mg Tabs (Glyburide) .[name]... Take 1 tab daily    Metformin Hcl 500 Mg Tabs (Metformin hcl) .[name]... 2 tabs am 1 tab noon time 2 tabs at night    Aspir-low 81 Mg Tbec (Aspirin) .[name]... Take 1 tab daily  Other Orders: Future Orders: T-Lipid Profile ([id number]) ... 05/02/2010 T-Hepatic Function [id number]) ... 05/02/2010  Patient Instructions: 1)  Your physician recommends that you schedule a follow-up appointment in: 3 months 2)  Your physician recommends that you return for lab work in: 3 months, just before next office visit 3)  Your physician has recommended you make the following change in your medication: STOP TAKING LEVITRA   Orders Added: 1)   T-Lipid Profile [[id number]] 2)  T-Hepatic Function [[id number]]

## 2010-02-09 NOTE — Letter (Signed)
Summary: [facility] [employer]  [address], [place] [address]   Phone: [phone]  Fax: [phone]     February 02, 2010 MRN: [id number]   Paul Baker [address] [place], [place]  [address]      YOUR LAB WORK IS DUE  May 02, 2010 _________________________________________  Please go to [facility], located across the street from [facility] on the second floor.  Hours are Monday - Friday 7am until 7:30pm         Saturday 8am until 12noon    __  DO NOT EAT OR DRINK AFTER MIDNIGHT EVENING PRIOR TO LABWORK  __ YOUR LABWORK IS NOT FASTING --YOU MAY EAT PRIOR TO LABWORK

## 2010-02-21 ENCOUNTER — Ambulatory Visit (HOSPITAL_COMMUNITY): Payer: 59

## 2010-02-23 ENCOUNTER — Ambulatory Visit (HOSPITAL_COMMUNITY): Payer: 59 | Attending: Cardiology

## 2010-02-23 DIAGNOSIS — I2510 Atherosclerotic heart disease of native coronary artery without angina pectoris: Secondary | ICD-10-CM | POA: Insufficient documentation

## 2010-02-23 DIAGNOSIS — Z5189 Encounter for other specified aftercare: Secondary | ICD-10-CM | POA: Insufficient documentation

## 2010-02-25 ENCOUNTER — Ambulatory Visit (HOSPITAL_COMMUNITY): Payer: 59

## 2010-02-28 ENCOUNTER — Ambulatory Visit (HOSPITAL_COMMUNITY): Payer: 59

## 2010-03-02 ENCOUNTER — Ambulatory Visit (HOSPITAL_COMMUNITY): Payer: 59

## 2010-03-03 ENCOUNTER — Telehealth (INDEPENDENT_AMBULATORY_CARE_PROVIDER_SITE_OTHER): Payer: Self-pay

## 2010-03-04 ENCOUNTER — Ambulatory Visit (HOSPITAL_COMMUNITY): Payer: Medicare Other | Attending: Cardiology

## 2010-03-04 DIAGNOSIS — Z5189 Encounter for other specified aftercare: Secondary | ICD-10-CM | POA: Insufficient documentation

## 2010-03-04 DIAGNOSIS — I2510 Atherosclerotic heart disease of native coronary artery without angina pectoris: Secondary | ICD-10-CM | POA: Insufficient documentation

## 2010-03-07 ENCOUNTER — Ambulatory Visit (HOSPITAL_COMMUNITY): Payer: Medicare Other

## 2010-03-09 ENCOUNTER — Ambulatory Visit (HOSPITAL_COMMUNITY): Payer: Medicare Other

## 2010-03-10 NOTE — Progress Notes (Signed)
**Note De-Identified Paul Baker Obfuscation** Summary: SAMPLES  Phone Note Call from Patient Call back at Home Phone [phone]   Caller: PT Reason for Call: Talk to Nurse Summary of Call: PT WOULD LIKE TO GET MORE SAMPLES OF 5MG  CRESTOR Initial call taken by: Faythe Ghee,  March 03, 2010 4:22 PM  Follow-up for Phone Call        Left detailed message on voice mail. Follow-up by: Larita Fife Edson Deridder LPN,  March 04, 2010 9:58 AM    Prescriptions: CRESTOR 5 MG TABS (ROSUVASTATIN CALCIUM) Take one tablet by mouth daily.  #42 tabs x 0   Entered and Authorized by:   Larita Fife Berthel Bagnall LPN   Signed by:   Larita Fife Zebulon Gantt LPN on 14/78/2956   Method used:   Samples Given   RxID:   [id number]  Lot # =[id number]    Exp. Date: 10/14

## 2010-03-11 ENCOUNTER — Ambulatory Visit (HOSPITAL_COMMUNITY): Payer: Medicare Other

## 2010-03-14 ENCOUNTER — Ambulatory Visit (HOSPITAL_COMMUNITY): Admission: RE | Admit: 2010-03-14 | Payer: Medicare Other | Source: Ambulatory Visit

## 2010-03-14 LAB — POCT I-STAT GLUCOSE: Glucose, Bld: 67 mg/dL — ABNORMAL LOW (ref 70–99)

## 2010-03-16 ENCOUNTER — Ambulatory Visit (HOSPITAL_COMMUNITY): Payer: Medicare Other

## 2010-03-18 ENCOUNTER — Ambulatory Visit (HOSPITAL_COMMUNITY): Payer: Medicare Other

## 2010-03-21 ENCOUNTER — Ambulatory Visit (HOSPITAL_COMMUNITY): Payer: Medicare Other

## 2010-03-21 ENCOUNTER — Encounter (HOSPITAL_COMMUNITY): Payer: Medicare Other

## 2010-03-21 ENCOUNTER — Other Ambulatory Visit: Payer: Self-pay | Admitting: Orthopaedic Surgery

## 2010-03-21 LAB — CBC
HCT: 41 % (ref 39.0–52.0)
Hemoglobin: 13.2 g/dL (ref 13.0–17.0)
MCHC: 32.2 g/dL (ref 30.0–36.0)
RBC: 4.53 MIL/uL (ref 4.22–5.81)
WBC: 7.6 10*3/uL (ref 4.0–10.5)

## 2010-03-21 LAB — BASIC METABOLIC PANEL
CO2: 27 mEq/L (ref 19–32)
Chloride: 106 mEq/L (ref 96–112)
Glucose, Bld: 58 mg/dL — ABNORMAL LOW (ref 70–99)
Potassium: 4.2 mEq/L (ref 3.5–5.1)
Sodium: 139 mEq/L (ref 135–145)

## 2010-03-21 LAB — SURGICAL PCR SCREEN: MRSA, PCR: NEGATIVE

## 2010-03-23 ENCOUNTER — Ambulatory Visit (HOSPITAL_COMMUNITY): Payer: Medicare Other

## 2010-03-25 ENCOUNTER — Ambulatory Visit (HOSPITAL_COMMUNITY)
Admission: RE | Admit: 2010-03-25 | Discharge: 2010-03-25 | Disposition: A | Discharge status: Home / Self Care | Payer: Medicare Other | Source: Ambulatory Visit | Attending: Orthopaedic Surgery | Admitting: Orthopaedic Surgery

## 2010-03-25 ENCOUNTER — Ambulatory Visit (HOSPITAL_COMMUNITY): Payer: Medicare Other

## 2010-03-25 DIAGNOSIS — Z85828 Personal history of other malignant neoplasm of skin: Secondary | ICD-10-CM | POA: Insufficient documentation

## 2010-03-25 DIAGNOSIS — Y831 Surgical operation with implant of artificial internal device as the cause of abnormal reaction of the patient, or of later complication, without mention of misadventure at the time of the procedure: Secondary | ICD-10-CM | POA: Insufficient documentation

## 2010-03-25 DIAGNOSIS — M129 Arthropathy, unspecified: Secondary | ICD-10-CM | POA: Insufficient documentation

## 2010-03-25 DIAGNOSIS — G4733 Obstructive sleep apnea (adult) (pediatric): Secondary | ICD-10-CM | POA: Insufficient documentation

## 2010-03-25 DIAGNOSIS — T8489XA Other specified complication of internal orthopedic prosthetic devices, implants and grafts, initial encounter: Secondary | ICD-10-CM | POA: Insufficient documentation

## 2010-03-25 DIAGNOSIS — E119 Type 2 diabetes mellitus without complications: Secondary | ICD-10-CM | POA: Insufficient documentation

## 2010-03-25 DIAGNOSIS — I2510 Atherosclerotic heart disease of native coronary artery without angina pectoris: Secondary | ICD-10-CM | POA: Insufficient documentation

## 2010-03-25 DIAGNOSIS — Z96659 Presence of unspecified artificial knee joint: Secondary | ICD-10-CM | POA: Insufficient documentation

## 2010-03-25 DIAGNOSIS — J45909 Unspecified asthma, uncomplicated: Secondary | ICD-10-CM | POA: Insufficient documentation

## 2010-03-25 DIAGNOSIS — Z01812 Encounter for preprocedural laboratory examination: Secondary | ICD-10-CM | POA: Insufficient documentation

## 2010-03-25 DIAGNOSIS — Z79899 Other long term (current) drug therapy: Secondary | ICD-10-CM | POA: Insufficient documentation

## 2010-03-25 DIAGNOSIS — E669 Obesity, unspecified: Secondary | ICD-10-CM | POA: Insufficient documentation

## 2010-03-25 DIAGNOSIS — M25539 Pain in unspecified wrist: Secondary | ICD-10-CM | POA: Insufficient documentation

## 2010-03-25 LAB — GLUCOSE, CAPILLARY
Glucose-Capillary: 118 mg/dL — ABNORMAL HIGH (ref 70–99)
Glucose-Capillary: 123 mg/dL — ABNORMAL HIGH (ref 70–99)

## 2010-03-28 ENCOUNTER — Ambulatory Visit (HOSPITAL_COMMUNITY): Payer: Medicare Other

## 2010-03-30 ENCOUNTER — Ambulatory Visit (HOSPITAL_COMMUNITY): Payer: Medicare Other

## 2010-04-01 ENCOUNTER — Ambulatory Visit (HOSPITAL_COMMUNITY): Payer: Medicare Other

## 2010-04-02 NOTE — H&P (Signed)
  NAMEAVIS, MCMAHILL            ACCOUNT NO.:  [id number]  MEDICAL RECORD NO.:  [id number]           PATIENT TYPE:  O  LOCATION:  DAYL                         FACILITY:  [facility]  PHYSICIAN:  Vanita Panda. Magnus Ivan, M.D.DATE OF BIRTH:  June 26, 1950  DATE OF ADMISSION:  03/25/2010 DATE OF DISCHARGE:  03/25/2010                             HISTORY & PHYSICAL   CHIEF COMPLAINT:  Painful prominent screw, right wrist.  HISTORY OF PRESENT ILLNESS:  Mr. Enns is a 60 year old gentleman who had a fusion of his right wrist over 20 years ago after a trauma. He had multiple wrist surgeries and got to where he is finally not having pain.  However, over the last 2 years, he has noted that the screws started backing out and he presented to the office with a prominent screw, he wanted to have this removed.  PAST MEDICAL HISTORY: 1. Heart disease. 2. High blood pressure. 3. Diabetes. 4. Sleep apnea. 5. Migraines. 6. Depression. 7. Arthritis. 8. Asthma. 9. Skin cancer.  CURRENT MEDICATIONS: 1. Nitrostat. 2. Isosorbide. 3. Metformin. 4. TriCor. 5. Hydrochlorothiazide. 6. Losartan. 7. Metoprolol. 8. Crestor.  ALLERGIES:  CODEINE.  PREVIOUS SURGERIES:  Total knee replacement, total knee revision, wrist fusion, tonsillectomy.  FAMILY MEDICAL HISTORY:  Diabetes, heart disease, cancer, high blood pressure.  SOCIAL HISTORY:  He is married.  His wife is with him.  He is retired. He does not smoke, drinks occasionally.  REVIEW OF SYSTEMS:  Negative for chest pain, shortness of breath, fever, chills, nausea, and vomiting.  PHYSICAL EXAMINATION:  VITAL SIGNS:  He is afebrile with stable vital signs. GENERAL:  He is alert and oriented x3, in no acute distress, in minimal discomfort. HEENT:  Normocephalic, atraumatic.  Pupils equally round and reactive to light.  Extraocular muscles intact. LUNGS:  Clear to auscultation bilaterally. HEART:  Regular rate and rhythm. ABDOMEN:   Benign. EXTREMITIES:  Right wrist shows prominent screw in the dorsal radial aspect of the wrist.  It is painful.  There is no evidence of infection. He is neurovascularly intact.  IMAGING:  X-rays, screw backing out of the right wrist with intact fusion.  ASSESSMENT:  Painful prominent hardware of right wrist.  PLAN:  I have told them we could try to just take out the 1 screw.  We could eventually take out the whole fusion plate.  It has always been in there, at least 20 years so that sometimes that makes things difficult to remove yet not impossible.  We will at least start with removing the 1 screw and remove from there accordingly.  He understands this.  This is going to be an outpatient surgery, which we will perform today.     Vanita Panda. Magnus Ivan, M.D.     CYB/MEDQ  D:  03/25/2010  T:  03/26/2010  Job:  [id number]  Electronically Signed by Doneen Poisson M.D. on 04/02/2010 04:08:10 PM

## 2010-04-02 NOTE — Op Note (Signed)
  NAMELORIS, WINROW            ACCOUNT NO.:  [id number]  MEDICAL RECORD NO.:  [id number]           PATIENT TYPE:  O  LOCATION:  DAYL                         FACILITY:  [facility]  PHYSICIAN:  Vanita Panda. Magnus Ivan, M.D.DATE OF BIRTH:  30-Jul-1950  DATE OF PROCEDURE:  03/25/2010 DATE OF DISCHARGE:  03/25/2010                              OPERATIVE REPORT   PREOPERATIVE DIAGNOSIS:  Painful prominent screw, left wrist, status post fusion over 20 years ago.  POSTOPERATIVE DIAGNOSIS:  Painful prominent screw, left wrist, status post fusion over 20 years ago.  PROCEDURE:  Removal of painful prominent screw x1, right wrist.  SURGEON:  Vanita Panda. Magnus Ivan, M.D.  ANESTHESIA: 1. Right regional supraclavicular block. 2. MAC inhalation and IV sedation.  BLOOD LOSS:  Minimal.  COMPLICATIONS:  None.  INDICATIONS:  Mr. Kearse is a 60 year old gentleman who had a wrist fusion after trauma, this was 20 years ago, this was with fusion plate. Over the last 8 years, he noticed one of the distal screws backing out and has gotten worse, painful and prominent for him.  We wished to have this screw removed.  X-rays showed that the other screws were intact, but the one screw has backed out over time and again he says this has been going on for at least 8 years.  DESCRIPTION OF PROCEDURE:  After informed consent was obtained, appropriate right wrist was marked.  Anesthesia obtained in a supraclavicular block, he was then brought to the operating room, placed supine on the operating table with his right arm on arm table.  His right arm, hand, wrist, forearm were prepped and draped with DuraPrep and sterile drapes.  Time-out was called, which identified the correct patient, correct the right arm.  I then used forceps to test the skin and he had adequate anesthesia.  I made a small incision directly over the prominent screw and was able to back the screw out easily in its entirety.  I then  copiously irrigated the tissues and I closed the skin with interrupted nylon suture.  Xeroform  followed by well-padded sterile dressing was applied.  The patient was taken to the recovery room in stable condition.  All final counts were correct and blood loss was minimal.    Vanita Panda. Magnus Ivan, M.D.    CYB/MEDQ  D:  03/25/2010  T:  03/26/2010  Job:  [id number]  Electronically Signed by Doneen Poisson M.D. on 04/02/2010 04:08:12 PM

## 2010-04-04 ENCOUNTER — Ambulatory Visit (HOSPITAL_COMMUNITY): Admission: RE | Admit: 2010-04-04 | Payer: Medicare Other | Source: Ambulatory Visit

## 2010-04-04 DIAGNOSIS — Z5189 Encounter for other specified aftercare: Secondary | ICD-10-CM | POA: Insufficient documentation

## 2010-04-04 DIAGNOSIS — I2510 Atherosclerotic heart disease of native coronary artery without angina pectoris: Secondary | ICD-10-CM | POA: Insufficient documentation

## 2010-04-06 ENCOUNTER — Ambulatory Visit (HOSPITAL_COMMUNITY): Payer: Medicare Other | Attending: Cardiology

## 2010-04-08 ENCOUNTER — Ambulatory Visit (HOSPITAL_COMMUNITY): Payer: Medicare Other

## 2010-04-10 LAB — GLUCOSE, CAPILLARY
Glucose-Capillary: 103 mg/dL — ABNORMAL HIGH (ref 70–99)
Glucose-Capillary: 111 mg/dL — ABNORMAL HIGH (ref 70–99)
Glucose-Capillary: 80 mg/dL (ref 70–99)

## 2010-04-10 LAB — CBC
HCT: 37.5 % — ABNORMAL LOW (ref 39.0–52.0)
Hemoglobin: 12.8 g/dL — ABNORMAL LOW (ref 13.0–17.0)
MCV: 88.9 fL (ref 78.0–100.0)
Platelets: 240 10*3/uL (ref 150–400)
RDW: 14 % (ref 11.5–15.5)

## 2010-04-10 LAB — BASIC METABOLIC PANEL
BUN: 23 mg/dL (ref 6–23)
Chloride: 105 mEq/L (ref 96–112)
Glucose, Bld: 58 mg/dL — ABNORMAL LOW (ref 70–99)
Potassium: 3.9 mEq/L (ref 3.5–5.1)
Sodium: 137 mEq/L (ref 135–145)

## 2010-04-11 ENCOUNTER — Ambulatory Visit (HOSPITAL_COMMUNITY): Payer: Medicare Other

## 2010-04-13 ENCOUNTER — Ambulatory Visit (HOSPITAL_COMMUNITY): Payer: Medicare Other

## 2010-04-15 ENCOUNTER — Ambulatory Visit (HOSPITAL_COMMUNITY): Payer: Medicare Other

## 2010-04-18 ENCOUNTER — Ambulatory Visit (HOSPITAL_COMMUNITY): Payer: Medicare Other

## 2010-04-20 ENCOUNTER — Ambulatory Visit (HOSPITAL_COMMUNITY): Payer: Medicare Other

## 2010-04-20 ENCOUNTER — Telehealth: Payer: Self-pay | Admitting: Cardiology

## 2010-04-20 MED ORDER — ROSUVASTATIN CALCIUM 5 MG PO TABS: 5.0000 mg | ORAL_TABLET | Freq: Every day | ORAL | Status: DC

## 2010-04-20 NOTE — Telephone Encounter (Signed)
Pt needs crestor 5mg  refilled, doesn't know who we get it through. He has up coming appt with [facility] 05/12/10.

## 2010-04-22 ENCOUNTER — Ambulatory Visit (HOSPITAL_COMMUNITY): Payer: Medicare Other

## 2010-04-25 ENCOUNTER — Ambulatory Visit (HOSPITAL_COMMUNITY): Payer: Medicare Other

## 2010-04-27 ENCOUNTER — Ambulatory Visit (HOSPITAL_COMMUNITY): Payer: Medicare Other

## 2010-04-28 ENCOUNTER — Other Ambulatory Visit: Payer: Self-pay | Admitting: Cardiology

## 2010-04-28 LAB — LIPID PANEL
HDL: 42 mg/dL (ref >39)
LDL Cholesterol: 83 mg/dL (ref 0–99)
Total CHOL/HDL Ratio: 3.5 Ratio

## 2010-04-28 LAB — HEPATIC FUNCTION PANEL
AST: 17 U/L (ref 0–37)
Albumin: 4.5 g/dL (ref 3.5–5.2)
Alkaline Phosphatase: 30 U/L — ABNORMAL LOW (ref 39–117)
Bilirubin, Direct: 0.1 mg/dL (ref 0.0–0.3)
Total Bilirubin: 0.3 mg/dL (ref 0.3–1.2)

## 2010-04-29 ENCOUNTER — Ambulatory Visit (HOSPITAL_COMMUNITY): Payer: Medicare Other

## 2010-05-02 ENCOUNTER — Ambulatory Visit (HOSPITAL_COMMUNITY): Payer: Medicare Other

## 2010-05-04 ENCOUNTER — Ambulatory Visit (HOSPITAL_COMMUNITY): Payer: Medicare Other | Attending: Cardiology

## 2010-05-04 DIAGNOSIS — Z5189 Encounter for other specified aftercare: Secondary | ICD-10-CM | POA: Insufficient documentation

## 2010-05-04 DIAGNOSIS — I2510 Atherosclerotic heart disease of native coronary artery without angina pectoris: Secondary | ICD-10-CM | POA: Insufficient documentation

## 2010-05-06 ENCOUNTER — Ambulatory Visit (HOSPITAL_COMMUNITY): Payer: Medicare Other

## 2010-05-09 ENCOUNTER — Ambulatory Visit (HOSPITAL_COMMUNITY): Payer: Medicare Other

## 2010-05-11 ENCOUNTER — Ambulatory Visit (HOSPITAL_COMMUNITY): Payer: Medicare Other

## 2010-05-12 ENCOUNTER — Ambulatory Visit: Payer: Self-pay | Admitting: Cardiology

## 2010-05-13 ENCOUNTER — Ambulatory Visit (HOSPITAL_COMMUNITY): Payer: Medicare Other

## 2010-05-16 ENCOUNTER — Ambulatory Visit (HOSPITAL_COMMUNITY): Payer: Medicare Other

## 2010-05-17 NOTE — Op Note (Signed)
NAMEWILDON, CUEVAS            ACCOUNT NO.:  [id number]   MEDICAL RECORD NO.:  [id number]          PATIENT TYPE:  INP   LOCATION:  3030                         FACILITY:  [facility]   PHYSICIAN:  Hilda Lias, M.D.   DATE OF BIRTH:  1950-11-26   DATE OF PROCEDURE:  07/22/2008  DATE OF DISCHARGE:                               OPERATIVE REPORT   PREOPERATIVE DIAGNOSIS:  Left L4-L5 extraforaminal herniated disk with  chronic L4 radiculopathy.   POSTOPERATIVE DIAGNOSIS:  Left L4-L5 extraforaminal herniated disk with  chronic L4 radiculopathy.   PROCEDURE:  Left L3, L4, L5 extraforaminal diskectomy, decompression of  the L4 nerve root, lysis of adhesions, microscope.   SURGEON:  Hilda Lias, MD   CLINICAL HISTORY:  Mr. Schuneman is a gentleman who had been complaining  of back pain for many years.  The patient is getting worse, and lately  he had failed conservative treatment.  X-rays showed that he has an  extraforaminal disk at the level L4-L5 to the left and in the L4 nerve  root.  Surgery was advised.   PROCEDURE:  The patient was taken to the OR and after intubation the  skin was cleaned with DuraPrep.  We were unable to feel any bone process  secondary to his obesity.  Then x-rays showed that indeed we were at the  L4-L5.  From then on we opened the L4-L5 and we were all the way extra  laterally to get beyond the facet.  Three x-rays were taken just to be  100% sure they were in the right area.  Then with the microscope, we  removed the intertransverse ligament.  We drilled the lateral aspect of  the facet.  With using decompression indeed we found that the L3 root  was close to L4.  The L3 was mobile but the L4 was completely adherent  to the disk.  The patient has a large disk that went laterally into the  ganglion.  The patient had quite a bit of adhesion and it was quite  difficult to mobilize the nerve by itself.  We had to do lysis of  adhesions and once we  accomplished this, we opened the disk.  The disk  has two components, one soft one, the other one was almost same as  calcified.  Decompression was achieved and at the end, we had plenty of  room for the L4 nerve root.  Hemostasis was accomplished.  The area was  irrigated with copious amount of saline solution.  Depo-Medrol and  fentanyl were left in the epidural area and the wound was closed with  Vicryl and Steri-Strips.           ______________________________  Hilda Lias, M.D.     EB/MEDQ  D:  07/22/2008  T:  07/23/2008  Job:  [id number]

## 2010-05-18 ENCOUNTER — Ambulatory Visit (HOSPITAL_COMMUNITY): Payer: Medicare Other

## 2010-05-20 ENCOUNTER — Ambulatory Visit (HOSPITAL_COMMUNITY): Payer: Medicare Other

## 2010-05-20 NOTE — H&P (Signed)
NAME:  Paul Baker, Paul Baker                      ACCOUNT NO.:  [id number]   MEDICAL RECORD NO.:  [id number]                   PATIENT TYPE:  INP   LOCATION:  A207                                 FACILITY:  [facility]   PHYSICIAN:  Donna Bernard, M.D.             DATE OF BIRTH:  [birth year]   DATE OF ADMISSION:  03/19/2002  DATE OF DISCHARGE:                                HISTORY & PHYSICAL   CHIEF COMPLAINT:  Right arm swelling.   SUBJECTIVE:  This patient is a 60 year old white male with a history of type  2 diabetes, hypertension, prior DVTs with pulmonary emboli, hyperlipidemia,  restless leg syndrome, chronic disk disease and chronic orthopedic concerns.  He presents to the office the day of admission with complaints of right arm  swelling.  Recently he was in the hospital with chest discomfort and went  through a rule out MI protocol with negative enzymes.  A subsequent  Cardiolite stress test was negative. The patient, recently has had a lot of  difficulties with a Staphylococcus infection of a left knee total  replacement.  Just earlier this week he finished the 6 week course of  vancomycin IV.  Of note, he had a line in his right inner arm.  He noticed,  the last 2-3 days that his right arm and hand had begun to swell.  They had  removed the catheter a couple of days ago. The patient has had no fever, no  cough, no shortness of breath, no chest discomfort.   PAST SURGICAL HISTORY:  Prior surgeries:  A tremendous number of knee  surgeries with recent left knee replacement, remote wrist surgery.   PAST MEDICAL HISTORY:  Prior medical history is significant for both DVTs  and pulmonary emboli in 1982.  The patient is not sure if he had a workup  for any blood dyscrasias leading to this tendency.   FAMILY HISTORY:  Positive for hypertension, diabetes, coronary artery  disease, and hyperlipidemia.   SOCIAL HISTORY:  The patient is currently disabled.  He is married.  Two  children.  No tobacco use in the last 7 years.   ALLERGIES:  Possibly codeine.  Sensitive to a lot of other medicines.  The  patient has been able to take Vicodin without any problem.   REVIEW OF SYSTEMS:  Otherwise negative.  CARDIAC:  Recent negative cardiac  stress test with Cardiolite augmentation.  Examination in the office did  reveal hypertension and we recommended switching to hydrochlorothiazide to  Dyazide.  The patient was due to start this, also today.   PHYSICAL EXAMINATION:  VITAL SIGNS:  BP 150/98.  GENERAL:  The patient is alert in no acute distress.  HEENT:  Normal.  NECK:  Supple.  No lymphadenopathy.  LUNGS:  Clear.  HEART:  Regular rate and rhythm.  ABDOMEN:  Soft, obese.  EXTREMITIES:  Right arm edema noted from upper arm through to the fingers.  There is some diffuse mild erythema and some diffuse mild tenderness.  There  is no distinct tenderness.  No palpable cords.  Left knee scar noted.  Ankles 1+ edema bilaterally.  Good range of motion.  Significant crepitation  of the joints.  NEUROLOGIC:  Normal.  SKIN:  No significant lesions.   X-RAY AND LABORATORY DATA:  Upper arm ultrasound revealed a right DVT in the  basilar and brachial veins with cephalic subclavian, brachiocephalic and  jugular veins all patent.  Please see chart for other blood work.   IMPRESSION:  1. Acute deep venous thrombosis in the right arm.  2. History of deep venous thromboses and prior pulmonary embolism.  The     patient notes that he was in the hospital 45 days for this pulmonary     embolism.  3. 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  4. Type 2 diabetes.  5. Hypertension.  6. Chronic disk disease.   PLAN:  As per orders.  We will press on with Lovenox and Coumadin and  further orders as on the chart.                                                Donna Bernard, M.D.    Karie Chimera  D:  03/19/2002  T:  03/19/2002   Job:  [id number]

## 2010-05-20 NOTE — H&P (Signed)
NAME:  Paul Baker, Paul Baker                      ACCOUNT NO.:  [id number]   MEDICAL RECORD NO.:  [id number]                   PATIENT TYPE:  INP   LOCATION:  NA                                   FACILITY:  [facility]   PHYSICIAN:  Madlyn Frankel. Charlann Boxer, M.D.               DATE OF BIRTH:  30-Apr-1950   DATE OF ADMISSION:  DATE OF DISCHARGE:                                HISTORY & PHYSICAL   CHIEF COMPLAINT:  Left knee pain.   HISTORY OF PRESENT ILLNESS:  Paul Baker is a 60 year old gentleman who  was seen by Dr. Charlann Boxer for a second opinion from Dr. [name] regarding his  left knee.  He is status post multiple knee arthroscopies in the past and  subsequently receiving care from Dr. [name].  He underwent a primary total  knee replacement in July, 2003, and it was apparently unremarkable.  On  further review of this, there was some incident at which point during  therapy he was noted to have some drainage; however, on subsequent followup  by Dr. [name], the wound had sealed up, and there was no further problem.  Paul Baker continued to have some problems with recurrent effusions of  the left knee.  In January, 2004, he had an irrigation and debridement.  Tissue sample was sent off for the left knee, and he was treated with six  weeks of vancomycin and Rifampin and eight months of doxycycline.  He then  had his knee aspirated in April, 2004, which revealed some staph aureus,  which appeared to be MRSE.  He was last seen by Dr. [name] in January of  this year.  He is continuing to have problems regarding his knee with pain  and activity.  He has not reported fever and chills.  At that point, Dr.  [name] had mentioned to Paul Baker that he suggested followup with Dr.  Charlann Boxer with lab work and possible brain scan.  He is then seen by Dr. Charlann Boxer  most recently.  He continues to have painful, limited range of motion of the  total knee.  He is interested in undergoing surgical intervention at this  point.  The risks and benefits of the surgery were discussed with the  patient, and the patient wishes to proceed.   PAST MEDICAL HISTORY:  1. Hypertension.  2. Coronary artery disease.  3. Non-insulin-dependent diabetes mellitus.  4. History of PE and DVT.   PAST SURGICAL HISTORY:  1. Left total knee arthroplasty.  2. Right wrist surgery.  3. Multiple knee surgeries.   MEDICATIONS:  1. Triamterene/HCTZ 37.5/25 mg 1 p.o. q.d.  2. Zetia 10 mg 1 p.o. q.d.  3. Metformin/HCL 500 mg 1 p.o. b.i.d.  4. Cozaar 100 mg 1 p.o. q.d.  5. Avandia 8 mg 1 p.o. q.d.   ALLERGIES:  CODEINE causes a headache and rash.   SOCIAL HISTORY:  Patient denies any tobacco use.  Quit nine years  ago.  Also  denies any alcohol intake.  He is married and lives in a two-story house  with two steps entering the house.  His wife will be his caregiver after the  surgery.   FAMILY HISTORY:  Father with hypertension, coronary artery disease, and  cerebrovascular accident.  Mother with hypertension, coronary artery  disease, and liver cancer.   REVIEW OF SYSTEMS:  GENERAL:  Positive fever.  Denies chills, night sweats,  bleeding tendencies.  CNS:  Denies blurry or double vision, seizures,  headaches, paralysis.  RESPIRATORY:  Denies shortness of breath, productive  cough, or hemoptysis.  CV:  Denies chest pain, angina, or orthopnea.  GI:  Positive diarrhea.  Denies nausea or vomiting, constipation, melena, or  bloody stools.  GU:  Denies dysuria, hematuria, or discharge.  MUSCULOSKELETAL:  Positive tingling in bilateral feet.   PHYSICAL EXAMINATION:  VITAL SIGNS:  Blood pressure 128/98, temp 98.8, blood  pressure 100, respirations 16.  GENERAL:  A well-developed and well-nourished 60 year old male.  HEENT:  Normocephalic and atraumatic.  Pupils are equal, round and reactive  to light.  NECK:  Supple.  No carotid bruits noted.  LUNGS:  Clear to auscultation bilaterally.  No wheezes or crackles.  HEART:   Regular rate and rhythm.  No murmurs, rubs or gallops.  ABDOMEN:  Soft, nontender, nondistended.  Positive bowel sounds x4.  EXTREMITIES:  He lacks about 5 degrees of full extension.  He flexes to  about 80 or 90 degrees with pain.  He has palpable pulses and intact  sensibilities.  The left lower extremity has effusion on exam.  SKIN:  No rashes or lesions.   X-ray reveals a tibia with lucency around the phalange.   IMPRESSION:  1. Infected left total knee.  2. Hypertension.  3. Coronary artery disease.  4. Non-insulin-dependent diabetes mellitus.  5. History of pulmonary embolus and deep venous thrombosis.   PLAN:  Patient will be admitted to [facility] on Jun 02, 2003 to  undergo resection and left total knee arthroplasty with irrigation and  debridement and antibiotic spacer placement by Dr. Durene Romans.     Clarene Reamer, P.A.-C.                   Madlyn Frankel Charlann Boxer, M.D.    SW/MEDQ  D:  05/28/2003  T:  05/28/2003  Job:  [id number]   cc:   Donna Bernard, M.D.  [address]  [place]  [place] [address]  Fax: [phone]

## 2010-05-20 NOTE — Op Note (Signed)
[place]  Patient:    Paul Baker, Paul Baker Visit Number: [id number] MRN: [id number]          Service Type: SUR Location: [id number] Attending Physician:  Erasmo Leventhal Dictated by:   R. Valma Cava, M.D. Proc. Date: 07/19/01 Admit Date:  07/19/2001 Discharge Date: 07/23/2001                             Operative Report  PREOPERATIVE DIAGNOSIS:  Left knee end-stage osteoarthritis.  POSTOPERATIVE DIAGNOSIS:  Left knee end-stage osteoarthritis.  OPERATION PERFORMED:  Left total knee arthroplasty.  SURGEON:  R. Valma Cava, M.D.  ASSISTANT:  Irena Cords, P.A.-C.  ANESTHESIA:  General with postoperative femoral nerve block.  ESTIMATED BLOOD LOSS:  Less than 50 cc.  DRAINS:  Two mini Hemovacs.  COMPLICATIONS:  None.  TOURNIQUET TIME:  Two hours at 375 mHg.  DISPOSITION:  To PACU stable.  DESCRIPTION OF PROCEDURE:  The patient was counseled in the holding area and the correct side was identified.  IV was started, antibiotic were given.  The patient was then taken to the operating room, placed in supine position with general anesthesia.  The left knee was examined, 3 degree flexion contracture placed in 100 degrees only.  Foley catheter placed utilizing sterile technique by the OR circulating nurse.  IV preoperative Ancef had been given.  Elevated, prepped with DuraPrep and draped in sterile fashion.  He had previous skin incisions in the past and I was  concerned about a wound break down or problem.  Therefore the initial skin incision was made with the tourniquet down to make sure the edges of the skin flaps were viable.  I incorporated the lateral incision that was made in 1975.  I incorporated the distal aspect of that and then brought it more proximal trying to make the best incision possible between those two previous wounds. Medial and lateral skin flaps were developed at the appropriate level.  Throughout  this entire time, meticulous care of the blood supply was performed and the skin edges and skin flaps were nice and viable and pink at this time.  At this point we decided to proceed with the case.  Elevated, sponge replaced, exsanguinated with Esmarch and tourniquet inflated to 375 mHg.  He had undergone a previous extensor mechanism realignment.  At this time the patellar tendon, infrapatellar ligament insertion was marked medially with a staple.  Then proximally we had advanced his VMO and his quadriceps tendon to make the extensor mechanism somewhat lateral.  Identified the extensor mechanism distally, took the cautery, came proximal and then opened the extensor mechanism up to the medial aspect of his quadriceps tendon.  At this point we did a medial soft tissue release.  Then it was extremely difficult mobilizing and everting the patella. I released the lateral recess and went around the proximal and anterolateral aspects of the tibia and still could not get the patella to evert.  At this time, I decided to proceed with a quad snip, ________ proximally and this done.  At this point the patella extensor mechanism everted and the knee was flexed.  End-stage osteoarthritis change, bone-on-bone contact.  Cruciate ligaments were resected starting hole made in the distal femur. Intramedullary canal was irrigated, intramedullary rod was placed and showed a 5 degree valgus cut and 10 mm cut off the distal femur.  The femur was found to be a size #7.  Rotation marks made, distal femur was cut to a size #7. Medial and lateral collateral ligaments were protected as were apposed to neurovascular structures.  Tibial eminence was resected.  Initially took an 8 mm cut placed on the lateral side of the proximal tibia, then after separate trials later, we ended up taking 4 mm more.  This was done not compromising the extensor mechanism insertion but did allow an adequate polyethylene insertion.   Posteromedial and and postoperative osteophytes removed under direct visualization.  The proximal tibia was found to be a size #9.  Intramedullary hole was made. A step drill utilized.  Intramedullary canal was irrigated until effluent was clear.  Intramedullary rod was placed and again I chose a site 0 degree cut, 0 degree slope and took 8 mm off the lateral side and 4 mm later.  Femoral component was applied as was a size #9 tibia with a 10 mm insert with excellent range of motion and soft tissue balance.  Rotation marks made.  The delta keel was performed in standard fashion.  At this time it was decided to leave staple in place.  In order to remove the staple, I would have had to avulse and destabilize the internal ligament insertion.  I did not feel that would be appropriate.  A very complex primary total knee arthroplasty due to the extensor mechanism realignment previously.  Therefore at this time the patella was evaluated and found to be a size 26.  It was reamed to a depth of 10 mm.  Locking holes were made.  The knee was irrigated with pulsatile lavage thoroughly.  At this point utilizing modern cement technique, all components were cemented into place, size #9 tibia, size #7 femur with a 26 mm patella. After doing trials and the cement had cured, we put in a final 10 mm tibial insert, exposed bony surfaces were covered with bone wax.  Meticulous hemostasis was obtained posteromedial and posterolateral.  Patella was still tracking laterally.  Meticulous hemostasis was obtained, posteromedial and posterolateral.  Patella was still tracking laterally.  Lateral release was performed meticulously with hemostasis giving anatomic patellofemoral tracking at this time.  The knee was irrigated several times with antibiotic solution. Two mini Hemovac drains were placed.  Sequential closure in layers was done. I did a meticulous closure in layers was done.  I did a meticulous closure of the  extensor mechanism including quad snip if not ____________.  The patella tracked well.  The extensor mechanism was intact at the end of the case.  Subcutaneous closed with Vicryl.  Skin closed with staples.  Each layer irrigated during the closure.  Sterile compressive dressings applied. Additionally he is to be given Lovenox and Coumadin as he is secondary to history of DVT prophylaxis.  We went ahead and put on a snug bandage to prevent excessive bleeding and hematoma formation.  Pulses were excellent at the end of the case.  Also at this time we did a history of previous infection of the knee back in the 1970s, I went ahead and proceeded with vancomycin infusion.  At this point the tourniquet was deflated. We will have pharmacy _______ medications afterwards.  Excellent pulse to foot and left at the end of the case.  Skin flaps were viable, doing well and a femoral nerve block placed by the anesthesiologist.  Awakened, extubated, taken to operating room and PACU in stable condition.  The sponge and needle counts were correct.  No complications.  Very difficult primary  total knee arthroplasty.  ADDENDUM:  To decrease surgical time throughout the entire case, Mr. Imelda Pillow assistance was needed. Dictated by:   R. Valma Cava, M.D. Attending Physician:  Erasmo Leventhal DD:  07/19/01 TD:  07/24/01 Job: [id number]

## 2010-05-20 NOTE — Consult Note (Signed)
NAME:  Paul Baker, Paul Baker                      ACCOUNT NO.:  [id number]   MEDICAL RECORD NO.:  [id number]                   PATIENT TYPE:  INP   LOCATION:  A207                                 FACILITY:  [facility]   PHYSICIAN:  Ladona Horns. Neijstrom, MD               DATE OF BIRTH:  20-May-1950   DATE OF CONSULTATION:  03/21/2002  DATE OF DISCHARGE:                                   CONSULTATION   DIAGNOSES:  1. Right arm deep vein thrombosis.  2. History of left knee surgery approximately seven weeks ago complicated by     postoperative wound infection for which he was given vancomycin for six     weeks after the insertion of a PICC line into the right arm.  3. History of deep vein thrombosis at age 60 after right knee surgery for     which he was hospitalized for about three weeks, to the best of his     knowledge, but he does not remember about Coumadin therapy at that time.  4. Deep vein thrombosis of the right leg once again at age 60, complicated     by multiple pulmonary emboli following right knee surgery, and     hospitalized for 45 days at that time, for which he took also Coumadin     post-hospitalization for five years.  5. History of diabetes mellitus, adult onset type.  6. History of hypertension times several years, on therapy.  7. History of having smoked off and on since age 60, perhaps half a pack a     day for 2-3 months out of a year, and usually he would stop after that.  8. History of obesity times many years.  9. History of multiple knee surgeries in both the left knee and the right     knee in times past, and between 1987, which is when he moved to     [place] and now, he had a total of 11 surgical procedures, 7 on his     knees, 4 on his right wrist, none of which were complicated by deep vein     thrombosis before this PICC line complication.   HISTORY:  This is a very pleasant 60 year old Caucasian gentleman, a patient  of Dr. Constance Haw, who was admitted  on 03/19/02 with marked swelling and  discoloration of his right arm which was positive for deep vein thrombosis  by Doppler studies, and he has been started on Lovenox, elevation, and some  heat.  He is feeling better, but because of his past history, I was asked to  see him.   FAMILY HISTORY:  He has two sisters still alive without a history of  clotting.  He has two children, one daughter, one son, who are I believe in  their 31s without a history of clotting.  He has a father who died of  multiple small strokes.  His mother died  at age 38 of cancer in her liver,  without specific source being identified to the best of his knowledge, but  she did not have clots in her life either.  On the extended family issue  note, nobody is having clots that he is aware of, on his mother's side or  his father's side.   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  He used to drink alcohol, perhaps a case of  beer on a weekend, but he quite drinking about 15-20 years ago.   He has been a diabetic for a number of years, and he has also had  hypertension for a number of years.  He has also been overweight for a  number of years, but he states he has always been very active.  He is not a  sedentary person.   He and his wife, Lynnell Dike, have been married for many years.  They both quit  smoking together several years ago.   ALLERGIES:  No known drug allergies.   REVIEW OF SYSTEMS:  The rest of his review of systems is noncontributory.  He is not aware of any lumps or bumps anywhere.   MEDICATIONS ON ADMISSION:  1. Cozaar.  2. Avandia 8 mg p.o. daily.  3. Xanax 1 mg at h.s. p.r.n. for sleep.  4. Dyazide 37.5/25 mg once a day.  5. Doxycycline 100 mg p.o. b.i.d.  6. Rifampin 300 mg p.o. b.i.d. for this infection.  7. One aspirin per day.   PHYSICAL EXAMINATION:  GENERAL:  Presently he is in no acute distress.  VITAL SIGNS:  He is afebrile.   Blood pressure is 150/86, respirations 20 (it  was 18 and unlabored at rest).  He is afebrile.  His pulse is 88 and  regular.  He has no adenopathy of the cervical, supraclavicular, axillary, or inguinal  areas.  He has no obvious hepatosplenomegaly.  LUNGS:  Clear.  HEART:  Regular rhythm and rate without murmur, rub, or gallop.  EXTREMITIES:  He has scars over his right wrist, both knees (the left is  more recent, but they appear to be healing well).  There is no distinct heat  over the areas.  He has no peripheral edema.  Pulses in his feet are 2+ and  symmetrical.  He is right-handed.  His right upper arm is slightly puffy.  I  could not feel cords.  CHEST:  He has no true gynecomastia.  BACK:  He had no CVA tenderness.  NEUROLOGIC:  He was alert and oriented.  Tongue was normal to midline.  Teeth were in fair repair.  Pupils equal, round and reactive to light.   Mr. Mehring does have a very interesting history of clots that occur  always postoperative.   He has not had a clot since 1987 when he moved here and was taken off the  Coumadin until he had this PICC line procedure placed, and this is a clot  that is occurring in the same vein as the PICC line.   He has had a recent CT scan which did not show pulmonary emboli, and I  believe Dopplers of his leg were also negative.   I do think, however, he should have a factor IV Leiden antithrombin III  level, and prothrombin 20210A assay.  Will draw those today, and I will see  him in follow up.  I think at this point he  probably should have Coumadin  therapy for that right arm vein for 3-6 months, and then we may need to make  a decision about further Coumadin usage based upon the results of the tests  I have ordered today.  If he gets off of Coumadin and heparin, we can also  do protein C and protein S assays, as well.                                               Ladona Horns. Mariel Sleet, MD  ESN/MEDQ  D:  03/21/2002  T:   03/22/2002  Job:  [id number]   cc:   Donna Bernard, M.D.  [address]  [place]  [place] [address]  Fax: [phone]

## 2010-05-20 NOTE — Procedures (Signed)
Baker, Paul            ACCOUNT NO.:  [id number]   MEDICAL RECORD NO.:  [id number]          PATIENT TYPE:  OUT   LOCATION:  SLEEP LAB                     FACILITY:  [facility]   PHYSICIAN:  Marcelyn Bruins, M.D. [facility] DATE OF BIRTH:  08/05/1950   DATE OF STUDY:  08/07/2005                              NOCTURNAL POLYSOMNOGRAM   REFERRING PHYSICIAN:  Scott A. Luking, MD.   INDICATIONS FOR THE STUDY:  Hypersomnia with sleep apnea.  Also sleep  disturbance, other.   EPWORTH SCORE:  15.   SLEEP ARCHITECTURE:  The patient had a total sleep time of 323 minutes with  adequate slow wave sleep, but very decreased REM.  Sleep onset latency was  prolonged at 59 minutes as was REM onset at 225 minutes.  Sleep efficiency  was decreased at 78%.   RESPIRATORY DATA:  The patient underwent split night study where he was  found to have 268 obstructive events in the first 109 minutes of sleep.  This gave the patient a respiratory disturbance index over that time period  of 148 events per hour.  There was very loud snoring noted throughout; and  the events were not positional.  By protocol, the patient was then placed on  a medium Respironics comfort select nasal mask and ultimately titrated to a  final pressure of 13 cm of water with excellent control even in the supine  position.   OXYGEN DATA:  There was O2 desaturation as low as 79% with the patient's  obstructive events.   CARDIAC DATA:  No clinically significant cardiac arrhythmia.   MOVEMENT/PARASOMNIA:  The patient was found to have 172 leg jerks with 2 per  hour resulting in arousal or awakening.   IMPRESSION/RECOMMENDATIONS:  1.  Very severe obstructive sleep apnea/hypopnea syndrome with a respiratory      disturbance index of 148 events per hour and O2 desaturation as low as      79%.  Treatment for this degree of sleep apnea should focus primarily on      weight loss if applicable, as well as CPAP. The patient during this      study  was placed on CPAP with a medium Respironics comfort select nasal      mask; and ultimately titrated to a final pressure of 13 cm with      excellent control.  2.  Very large numbers of leg jerks with what appears to be significant      sleep disruption.  It is unclear from this study whether the leg jerks      are related to the patient's sleep disorder breathing, or whether he may      have a concomitant primary movement disorder of sleep such as the      periodic leg movement syndrome or restless leg syndrome.  Clinical      correlation is suggested if the patient fails to respond adequately to      optimal CPAP therapy.  ______________________________  Marcelyn Bruins, M.D. [facility]  Diplomate, American Board of Sleep  Medicine     KC/MEDQ  D:  08/08/2005 16:27:36  T:  08/08/2005 19:06:44  Job:  [id number]

## 2010-05-20 NOTE — H&P (Signed)
[name]  Patient:    Paul Baker, Paul Baker Visit Number: [id number] MRN: [id number]          Service Type: SUR Location: 5000 [id number] 01 Attending Physician:  Erasmo Leventhal Dictated by:   Marcie Bal Troncale, P.A.C. Admit Date:  07/19/2001 Discharge Date: 07/23/2001   CC:         [name], M.D.   History and Physical  DATE OF BIRTH:  April 18, 1950  SOCIAL SECURITY NUMBER:  [id number]  CHIEF COMPLAINT:  Left knee pain.  HISTORY OF PRESENT ILLNESS:  The patient is a 60 year old male who presents with significantly worsening left knee pain.  He has had multiple previous surgeries on that knee; he has also undergone a series of Synvisc injections as well as cortisone injection without improvement.  In one of his past surgeries, he did have a postoperative infection to that knee; this has since cleared.  Because of the lack of improvement with conservative measures, he has now elected to undergo surgical intervention on his knee.  REVIEW OF SYSTEMS:  Denies any recent fever or chills.  He does report some occasional blurred vision.  No diplopia or headaches.  No shortness of breath, chest pain or cough.  No earache, sore throats or rhinorrhea.  No abdominal pain, nausea, vomiting, diarrhea or constipation.  No melena or bright red blood per rectum.  No urinary frequency, hematuria or dysuria.  No numbness or tingling in his extremities.  PAST MEDICAL HISTORY:  Hypertension, coronary artery disease, non-insulin-dependent diabetes and a history of PE and DVTs, last PE was two decades ago.  PAST SURGICAL HISTORY:  Multiple knee surgeries and a wrist surgery.  FAMILY HISTORY:  Family history is significant for hypertension and coronary artery disease in his father and mother; father also had a stroke and mother had liver cancer.  ALLERGIES:  CODEINE, causing headaches.  CURRENT MEDICATIONS: 1. Cozaar 100 mg q.d. 2. Avandia 8  mg q.d. 3. Hydrochlorothiazide 25 mg q.d. 4. Xanax 1 mg q.h.s. p.r.n. 5. Glucosamine. 6. Vitamin E. 7. Multivitamin.  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  He stopped smoking about seven years ago.  He has two children.  He lives in a two-level home with two stairs.  PHYSICAL EXAMINATION:  VITAL SIGNS:  Pulse is 80, respiratory rate 16, blood pressure 136/90.  GENERAL:  This is a well-developed, well-nourished male in no acute distress.  HEENT:  Head is atraumatic and normocephalic.  Pupils are equal, round and reactive to light and extraocular movements are grossly intact.  Oropharynx is clear without redness, exudate or lesions.  NECK:  Supple with no cervical lymphadenopathy.  CHEST:  Clear to auscultation bilaterally with no wheezes or crackles.  HEART:  Regular rate and rhythm with no murmurs, rubs, or gallops.  ABDOMEN:  Soft, nontender and nondistended with no masses and no hepatosplenomegaly.  BREASTS:  Not examined as not pertinent to present illness.  GENITOURINARY:  Not examined as not pertinent to present illness.  SKIN:  Intact, although he has multiple surgical scars about the knee, well-healed.  PERIPHERAL VASCULAR:  DP and PT pulses 2+.  NEUROLOGIC:  Motor and sensory are grossly intact in the affected leg.  EXTREMITIES:  His left knee range of motion is 5 to 110 degrees, diffuse crepitation with motion, exquisite tenderness along the joint lines.  RADIOGRAPHIC STUDIES:  X-rays of the left knee demonstrate end-stage osteoarthritis.  IMPRESSION: 1. Left knee osteoarthritis.  2. Hypertension. 3. Coronary artery disease. 4. Non-insulin-dependent diabetes. 5. History of pulmonary embolus and deep venous thrombosis.  PLAN:  The patient will be admitted to [name] to Dr. [name] to undergo a left total knee arthroplasty on July 19, 2001.  The patient saw Dr. [name] for preoperative medical evaluation.  I have  discussed this with Dr. [name] and the patient is stable in regard to his diabetes and hypertension; he also had a negative cardiac stress test.  The only area of real concern that Dr. [name] expressed was the patients past history of DVT and PE.  He recommended aggressive intraoperative and postoperative DVT prophylaxis consisting of Coumadin, Lovenox, PlexiPulse, TED hose and all other measures applicable.  I discussed this with Dr. [name] and we will make all maximal attempts at DVT prophylaxis.  Preop labs and signed surgical consents will be obtained.  All questions have been encouraged and answered per the patient. Dictated by:   Marcie Bal Troncale, P.A.C. Attending Physician:  Erasmo Leventhal DD:  07/18/01 TD:  07/22/01 Job: [id number]

## 2010-05-20 NOTE — Discharge Summary (Signed)
   NAME:  FARREN, NELLES                      ACCOUNT NO.:  [id number]   MEDICAL RECORD NO.:  [id number]                   PATIENT TYPE:  INP   LOCATION:  A207                                 FACILITY:  [facility]   PHYSICIAN:  Donna Bernard, M.D.             DATE OF BIRTH:  March 20, 1950   DATE OF ADMISSION:  03/19/2002  DATE OF DISCHARGE:  03/24/2002                                 DISCHARGE SUMMARY   DISCHARGE DIAGNOSES:  1. Acute deep venous thrombosis, right arm.  2. Type 2 diabetes.  3. Hypertension.  4. History of prior deep venous thromboses and pulmonary embolus.  5. Chronic Staphylococcus infection of the knee.   DISPOSITION:  The patient was discharged to home.   DISCHARGE MEDICATIONS:  1. Coumadin 10 mg daily.  2. Lovenox 80 mg injection every 12 hours.   FOLLOW UP:  Follow up in our office in one week.   SPECIAL INSTRUCTIONS:  INR in 48 hours.   HISTORY OF PRESENT ILLNESS:  Please see H&P as dictated.   HOSPITAL COURSE:  This patient is a 60 year old, white male with type 2  diabetes, hypertension, prior DVTs, hyperlipidemia, restless leg syndrome,  chronic disc disease and chronic orthopedic concerns who presented to the  office on the day of admission with right arm swelling.  The patient had a  catheterization on that side and did an ultrasound which revealed  significant deep venous thrombosis.  With his history of prior pulmonary  emboli, he definitely needed to be in the hospital and he was admitted.  The  patient was started on fractionated heparin along with Coumadin.  The  hematology folks were consulted with his history of multiple DVTs.  Please  see the chart for their assessment.  The patient had a rather slow response  to Coumadin despite appropriate doses.  Finally, on the day of discharge, he  was feeling better and had less swelling in his arm and we elected to send  him home with regular dosing of Lovenox and Coumadin.  The patient was  discharged home with discharge diagnosis and disposition as above.                                               Donna Bernard, M.D.    Karie Chimera  D:  05/30/2002  T:  05/30/2002  Job:  [id number]

## 2010-05-20 NOTE — H&P (Signed)
[facility]  Patient:    Paul Baker, Paul Baker Visit Number: [id number] MRN: [id number]          Service Type: OBS Location: 2A A202 01 Attending Physician:  Harlow Asa Dictated by:   Donna Bernard, M.D. Admit Date:  05/01/2001 Discharge Date: 05/02/2001                           History and Physical  CHIEF COMPLAINT:  Leg swelling and near syncopal spells.  HISTORY OF PRESENT ILLNESS:  This patient is a 60 year old white male, with a history of prior deep vein thrombosis in the legs spreading to the lungs with pulmonary embolus in 1982.  He also has a long-standing history of musculoskeletal concerns.  In addition, the patient has a history of diabetes, hypercholesterolemia, and restless leg syndrome.  He presented to the office the day of admission with complaint of left leg swelling progressively over the last several weeks.  He states it has worsened in the past week.  He states he noted some redness in the leg.  He also today at work became quite light headed and this occurred in a couple of spells not associated with change in position.  He had noted headache.  This concerned him deeply because he is worried that his left leg may be having reemergence of a clot and he was concerned his symptoms of light headedness may be from a clot to his lungs. The patient notes no significant shortness of breath and ongoing fatigue.  He claims compliance with his current medications.  Below-the-knee compression stockings were prescribed a couple of weeks ago but the patient stated he could not tolerate these.  CURRENT MEDICATIONS:  1. Cozaar 50 mg two q.d.  2. Aspirin one q.d.  3. Xanax 1 mg q.h.s.  4. Hydrochlorothiazide 25 mg q.d.  5. Avandia 8 mg q.d.  6. Multivitamin q.d.  PAST SURGICAL HISTORY:  1. At least ten knee surgeries in the last 20 years.  2. Multiple other surgeries of knees and wrists noted.  3. History of pulmonary embolus in  1982.  SOCIAL HISTORY:  The patient works on a Sports coach.  He is married.  No smoking since 1997.  Two children.  FAMILY HISTORY:  Positive for hypertension, diabetes, coronary artery disease, hypercholesterolemia, questionable prostate cancer.  ALLERGIES:  No true allergies.  Multiple side effects.  REVIEW OF SYSTEMS:  Stable.  PHYSICAL EXAMINATION:  VITAL SIGNS:  BP 140/100 on repeat.  GENERAL:  The patient is alert, somewhat anxious.  No acute distress.  HEENT:  Normal.  NECK:  Supple.  No JVD.  LUNGS:  Clear.  No tachypnea.  CARDIAC:  Regular rate and rhythm.  ABDOMEN:  Soft.  No significant tenderness.  Obesity noted.  EXTREMITIES:  Leg, 1-2+ edema bilaterally, left greater than right.  No true erythema appreciated.  Edema is pitting.  Significant bilateral crepitations. Knee has no obvious effusion.  Distal arterial pulses excellent.  IMPRESSION:  1. Progressive leg swelling on both sides with prominence on the left side.     This may just be progressive worsening of the patients chronic venous     insufficiency aggravated by an inflammatory component from his chronic     knee difficulties.  However, I cannot by examination rule out the     possibility of left leg deep vein thrombosis.  2. Hypertension, suboptimal.  3. Type 2 diabetes.  4. Multiple musculoskeletal  problems with deterioration of the knees.  PLAN:  1. Urgent admission.  2. Ultrasound of leg.  3. Lovenox pending monitoring of vitals.  4. Further orders as noted on the chart. Dictated by:   Donna Bernard, M.D. Attending Physician:  Harlow Asa DD:  05/05/01 TD:  05/06/01 Job: [id number]

## 2010-05-20 NOTE — Discharge Summary (Signed)
NAME:  Paul Baker, GRUNDMAN                      ACCOUNT NO.:  [id number]   MEDICAL RECORD NO.:  [id number]                   PATIENT TYPE:  INP   LOCATION:  0470                                 FACILITY:  [facility]   PHYSICIAN:  Madlyn Frankel. Charlann Boxer, M.D.               DATE OF BIRTH:  27-Jul-1950   DATE OF ADMISSION:  08/13/2003  DATE OF DISCHARGE:  08/17/2003                                 DISCHARGE SUMMARY   ADMISSION DIAGNOSES:  1. Resolved infection, left knee, status post antibiotic implants.  2. Noninsulin dependent diabetes mellitus.  3. Osteoarthritis.  4. Hypercholesterolemia.  5. Hypertension.  6. History of deep venous thrombosis in legs, arm, and lungs.   DISCHARGE DIAGNOSES:  1. Resolved infection, left knee, status post antibiotic implants.  2. Noninsulin dependent diabetes mellitus.  3. Osteoarthritis.  4. Hypercholesterolemia.  5. Hypertension.  6. History of deep venous thrombosis in legs, arms, and lungs.  7. Postoperative anemia.   OPERATION:  On August 13, 2003 the patient underwent left total knee  reimplantation.   ASSISTANT:  DAshley Akin, P.A.C.   BRIEF HISTORY AND PHYSICAL:  This 60 year old gentleman who underwent total  knee replacement arthroplasty, then had postoperative draining wounds.  A  single aspiration came back for infection.  He had undergone I&D antibiotic  treatment.  After extensive evaluation and second opinion by Dr. Hayden Rasmussen it was felt that the patient would benefit secondary to infected  knee replacement with __________resection arthroplasty.  This has been done  and the patient now presents for reimplantation and was admitted for same.   HOSPITAL COURSE:  The patient tolerated the surgical procedure quite well.  He is very familiar with total knee protocol postoperatively and progressed  into physical therapy with vigor.  He was tolerating the CPM, temperature in  a mild temp at 101.2.  He used incentive spirometer.   The__________ was 2.8.  The knee wound was dry.  It was felt that the patient could be managed in  his home environment with home health with Genevieve Norlander and arrangements were  made for his discharge.   LABORATORY DATA:  Hematologically showed preoperative CBC which showed a  mild anemia with hemoglobin 12, hematocrit 35.  Final hemoglobin 9.  Blood  chemistries were negative.  Urinalysis x2 was negative for urinary tract  infection.  The cultures taken intraoperatively showed no gross and no  anaerobes.  Gram's stain was negative for organisms.   Electrocardiogram showed normal sinus rhythm.  No chest x-ray done on this  admission.   CONDITION ON DISCHARGE:  Improved, stable.   PLAN:  The patient was discharged to his home to continue with weight  bearing as tolerated, return to see Dr. Charlann Boxer in about two weeks.   MEDICATIONS:  1. He was sent home on [place] NX one every three hours p.r.n. pain.  2. Robaxin 500 mg one q.6h. p.r.n. muscle spasm.  Continue with  home     medications and diet.  3. Coumadin per protocol.   ACTIVITY:  He may weight bear as tolerated on his operative knee.   WOUND CARE:  Use dry dressing p.r.n.   FOLLOW UP:  Follow with his family physician as needed.     Dooley L. Cherlynn June.                 Madlyn Frankel Charlann Boxer, M.D.    DLU/MEDQ  D:  08/25/2003  T:  08/25/2003  Job:  [id number]   cc:   Donna Bernard, M.D.  [address]  [place]  [place] [address]  Fax: [phone]

## 2010-05-20 NOTE — Discharge Summary (Signed)
NAME:  Paul Baker, Paul Baker                      ACCOUNT NO.:  [id number]   MEDICAL RECORD NO.:  [id number]                   PATIENT TYPE:  INP   LOCATION:  0481                                 FACILITY:  [facility]   PHYSICIAN:  Erasmo Leventhal, M.D.         DATE OF BIRTH:  Jun 27, 1950   DATE OF ADMISSION:  01/31/2002  DATE OF DISCHARGE:  02/03/2002                                 DISCHARGE SUMMARY   ADMISSION DIAGNOSIS:  Septic left total knee replacement.   DISCHARGE DIAGNOSIS:  Septic left total knee replacement.   OPERATION:  I&D left total knee replacement with deep cultures.   BRIEF HISTORY:  This is a 60 year old gentleman who has undergone previous  total knee replacement, had persistent effusion on the knee, had been worked  up for loosening.  Had no evidence of loosening and no evidence of infection  with no fevers, redness, warmth, or other signs of infection.  His effusion  had been tapped and cultured, and no growth occurred.  Due to continued  accumulation of effusion, the patient was brought to the OR for I&D.  There  a large clear effusion was noted as well as profuse grayish soft tissue  throughout the knee.  Cultures, AFB, fungal were taken.  A drain was placed,  and the patient was subsequently admitted overnight for observation.  Due to  the probability of infection, the patient will subsequently be discharged  from the outpatient center where surgery took place and admitted to [facility] for IV antibiotics and infectious disease consult.  As well, he will be  started on Lovenox tomorrow due to his previous history of DVT with PE.  The  nature of this was discussed in detail with the patient and family.  They  concur with the treatment plan, and it was to go ahead as scheduled.   LABORATORY VALUES:  Admission CBC showed RBC low at 3.95, hemoglobin low at  11.9, hematocrit low at 34.5, otherwise within normal limits.  PT and PTT  within normal limits on  admission.  The CMET was normal with the exception  of elevated glucose at 156.  Urinalysis was normal.  Cultures were not back  by discharge.   COURSE IN THE HOSPITAL:  The patient tolerated the operative procedure well.  In the hospital infectious disease consult was obtained, and plans were made  for made for six weeks of IV antibiotics with vancomycin and rifampin p.o.  and then converted to oral antibiotic and rifampin thereafter.  The second  postadmission day the patient was doing well.  His pain was well controlled.  His drain was removed without difficulty.  Vital signs were stable, he was  afebrile.  Motion, sensation, and circulation were intact.  The fourth  postoperative day his vital signs were stable, he was afebrile.  Neurovascularly he was intact as far as circulation, motor, and sensory  function.  His wound was benign.  Calves were  soft and nontender.  Cultures  had shown no growth so far.  The patient was subsequently scheduled for a  PICC line and possible discharge home when home IV antibiotics were set up.  This was carried out on the fourth postadmission day, and he was  subsequently discharged home for follow-up in the office.   CONDITION ON DISCHARGE:  Improved.   DISCHARGE MEDICATIONS:  1. Vancomycin and IV antibiotic at local hospital per Dr. Bonnetta Barry     instructions.  2. Lovenox 40 mg, 1 injection each day subcutaneously for 12 days.  The     patient has done this before, and this will be carried out at home.  3. Percocet 5/325, 1 pill every four to six hours as needed for pain.  4. Rifampin p.o. per Dr. Maurice March.  5. To resume home medications with the exception of taking aspirin or anti-     inflammatory medications which he should not take while he is on his     Lovenox.   DISCHARGE INSTRUCTIONS:  He is to keep his wound clean and dry.  Take his IV  antibiotics and p.o. antibiotics as directed by Dr. Maurice March.  Use his walker,  and touchdown weightbearing  only.  Change dressing as needed.  He is to  return to the office in one week for reevaluation, or call sooner p.r.n. any  problems.     Jaquelyn Bitter. Chabon, P.A.                   Erasmo Leventhal, M.D.    SJC/MEDQ  D:  02/21/2002  T:  02/21/2002  Job:  [id number]

## 2010-05-20 NOTE — Op Note (Signed)
NAME:  Paul Baker, Paul Baker                      ACCOUNT NO.:  [id number]   MEDICAL RECORD NO.:  [id number]                   PATIENT TYPE:  INP   LOCATION:  0007                                 FACILITY:  [facility]   PHYSICIAN:  Madlyn Frankel. Charlann Boxer, M.D.               DATE OF BIRTH:  [birth year]   DATE OF PROCEDURE:  06/02/2003  DATE OF DISCHARGE:                                 OPERATIVE REPORT   PREOPERATIVE DIAGNOSIS:  Infected left total knee replacement.   POSTOPERATIVE DIAGNOSIS:  Infected left total knee replacement.   OPERATION PERFORMED:  1. Left knee synovectomy.  2. Resection arthroplasty of the left knee.  3. Removal of hardware.  4. Application of antibiotic spacer block.   SURGEON:  Madlyn Frankel. Charlann Boxer, M.D.   ASSISTANT:  Clarene Reamer, P.A.-C.   ANESTHESIA:  General.   ESTIMATED BLOOD LOSS:  200 mL.   TOURNIQUET TIME:  90 minutes at 300 mmHg.   DRAINS:  Times one.   INTRAVENOUS FLUIDS:  2 L lactated Ringers.   COMPLICATIONS:  None apparent.   INDICATIONS FOR PROCEDURE:  Paul Baker is a 60 year old white male who is  status post a left total knee replacement that was placed in July of 2003.  He has had persistent problems with pain in the knee.  He has had a single  aspiration along the way that had revealed a staphylococcus bacteria,  otherwise had multiple other aspirations that have been negative.  He had  been seen and evaluated by Dr. Darlina Sicilian and Dr.  Unice Cobble in [place].  Dr. Darlina Sicilian felt that it was probably an infection and that hardware should  be removed.  He has had persistent problems with pain.  After reviewing with  him at length the surgical considerations and options available, he wishes  for some procedure to be performed at this point.  I feel that if we are  going to salvage the knee for him, he will need to undergo a two stage  resection arthroplasty.  The risks and benefits of this type procedure were  explained to him and he consents to  that procedure.   DESCRIPTION OF PROCEDURE:  The patient was brought to the operating theater.  Once adequate anesthesia was established, the patient was positioned supine  with the left leg in a tourniquet.  The left lower extremity was then  prepped and draped in sterile fashion.  A midline incision was made removing  the old scar.  Sharp dissection was then carried down to the extensor  mechanism.  The patient was noted to have a rent in the lateral retinacular  tissue maybe from an unhealed lateral retinacular release or from some other  mechanism. This area was debrided circumferentially back to normal-appearing  tissue.   A medial parapatellar arthrotomy was then carried out.  The extensor  mechanism remained intact throughout the procedure.  The hardware in the  medial  proximal tibia was identified, stripped of soft tissues and removed.  Inside the knee there was a large amount of collaginated type tissue, scar  type tissue that was removed.  The synovial fluid that was removed from the  knee did not have a gross appearance of purulence.  Following exposure of  the knee, attention was directed at this point at removing the components.  Care was taken using osteotomes oscillating saws etc in order to remove the  components with the least amount of bone resection as possible.  The femoral  component was removed first, followed by the tibial component.   Please note that upon removal in order to remove the tibial component, a  small portion of the distal posterior lateral cortex of the bone of the  lateral femoral condyle was removed in order to prevent fracture of the  tibial component.  Tibial tray needed this for clearance.  The patella was  removed by using an oscillating saw to make a careful resurfacing the  patella.  This was an inset patella.  Following the removal of the  components, further debridement of cement was carried out until all the  cement that was visible was  removed.  At this point the knee was copiously  irrigated with 3 L of normal saline solution while an antibiotic impregnated  cement spacer was made.  Once the cement had cured, the cement spacer was  placed in between the femur and the tibia with the knee in extension  followed by placement of a cement spacer shield  in the anterior tibia into  the patellofemoral joint to prevent scarring in the suprapatellar pouch.  Following this, the knee was re-irrigated with 3L of normal saline solution.  Further debridement of soft tissues was carried out as the knee was prepared  for closure.  The extensor mechanism including that lateral retinacular rent  were reapproximated using a #1 PDS.  The subcutaneous layer was  reapproximated using 2-0 Vicryl with staples on the skin.  A medium Hemovac  drain was placed deep.  The patient's knee was then cleaned, dried, dressed  sterilely with a bulky sterile dressing and transferred to recovery in  stable condition.  The patient tolerated the procedure without complication.   Note that 1 g of Vancomycin was administered once we had taken fluid and  tissue cultures.  The patient will be on Vancomycin in the hospital until  cultures are pending.  If no culture results, he will stay on the IV  Vancomycin for a total of six weeks.  PICC line was placed in the hospital.                                               Madlyn Frankel. Charlann Boxer, M.D.    MDO/MEDQ  D:  06/02/2003  T:  06/02/2003  Job:  [id number]

## 2010-05-20 NOTE — Procedures (Signed)
   NAME:  Paul Baker, Paul Baker                      ACCOUNT NO.:  [id number]   MEDICAL RECORD NO.:  [id number]                   PATIENT TYPE:  INP   LOCATION:  A207                                 FACILITY:  [facility]   PHYSICIAN:  Vida Roller, M.D.                DATE OF BIRTH:  Nov 10, 1950   DATE OF PROCEDURE:  03/07/2002  DATE OF DISCHARGE:                                  ECHOCARDIOGRAM   REFERRING PHYSICIAN:  Donna Bernard, M.D.   PROCEDURE:  Echocardiogram.   TAPE:  #[id number], tape count 5540 through 5915.   REASON FOR PROCEDURE:  A 60 year old gentleman with chest discomfort and  Staph aureus infection in his knee, rule out endocarditis.   QUALITY OF STUDY:  This is a technically poor study secondary to poor  acoustic windows.   M-MODE MEASUREMENTS:  1. The aorta is 29 mm.  2. The left atrium is 43 mm, which is enlarged.  3. The septum is 13 mm, which is enlarged.  4. The posterior wall is 11 mm.  5. The left ventricular diastolic dimension is 44 mm.  6. The left ventricular systolic dimension is 37 mm.   2-D AND DOPPLER IMAGING:  1. The left ventricle is normal size with normal systolic function.     Diastolic function is beyond the limits of the study, as is assessment of     wall motion.  2. The right ventricle appears normal size with normal systolic function.  3. The right atrium appears normal size.  The left atrium appears to be     slightly enlarged.  4. Assessment of valvular morphology and function is beyond the limits of     the study.  5. Pericardial structures appear normal.  6. The ascending aorta was not well seen.   ASSESSMENT:  Inadequate study to assess for endocarditis.  Recommend  transesophageal echocardiography.                                               Vida Roller, M.D.    JH/MEDQ  D:  03/07/2002  T:  03/07/2002  Job:  [id number]

## 2010-05-20 NOTE — Consult Note (Signed)
NAME:  Paul Baker, Paul Baker                      ACCOUNT NO.:  [id number]   MEDICAL RECORD NO.:  [id number]                   PATIENT TYPE:  INP   LOCATION:  A207                                 FACILITY:  [facility]   PHYSICIAN:  Vida Roller, M.D.                DATE OF BIRTH:  1950-07-12   DATE OF CONSULTATION:  03/06/2002  DATE OF DISCHARGE:                                   CONSULTATION   PRIMARY CARE Orla Estrin:  Dr. Lubertha South.   REASON FOR CONSULTATION:  Chest discomfort.   HISTORY OF PRESENT ILLNESS:  The patient is a 60 year old white male with  multiple episodes of evaluation for chest discomfort, first coming to the  attention of [facility] today.  He describes the onset of discomfort  in his chest associated with the infusion of vancomycin in the specialty  clinic here at [facility].  He states that the discomfort began  about one-half hour after the infusion of vancomycin, was associated  initially with pain in his jaw on the left side, then radiated all the way  across to the right side, down the neck, and then into the center of the  chest.  It lasted about an hour.  Was unrelieved by sublingual  nitroglycerin, was relieved by IV nitroglycerin in the emergency department.  He was evaluated at that time and found to be acutely hypertensive with  blood pressures of 190/100.  This resolved with the nitroglycerin.  His  chest pain resolved with the improvement in the blood pressure.  EKGs were  not done at the time of the chest discomfort but were done afterwards and  showed no significant ST and T-wave changes.   PAST MEDICAL HISTORY:  Significant for:  1. Hypertension.  2. Diabetes mellitus.  3. History of pulmonary embolus and DVT.  4. Episodes of knee surgeries on both sides several times and currently has     ongoing therapy for a septic arthritis of his left knee, which grew     Staphylococcus aureus, did not have bacteremia, and required IV  vancomycin and p.o. rifampin.  5. His other past medical history is significant for evaluation of his chest     discomfort.  He has had multiple stress Cardiolites in the past, all of     which have shown normal perfusion, several of which have only been     incomplete in that they have only had the stress portion done, but in     each case his ejection fraction is normal, and the perfusion is     interpreted as normal.  Ejection fraction documented at 57%.   MEDICATIONS ON ADMISSION:  1. Cozaar 100 mg a day.  2. Hydrochlorothiazide 12.5 a day.  3. Avandia 8 mg once a day.  4. Lipitor 10 mg a day.  5. Rifampin 200 mg twice a day.  6. Vancomycin infusions daily through a PICC line.  SOCIAL HISTORY:  He lives in [place].  He is disabled.  Used to work for  [employer].  Does not smoke.   FAMILY HISTORY:  His father died of renal failure and heart disease, with  multiple strokes.  Mother is still alive and healthy.   REVIEW OF SYSTEMS:  Noncontributory.   PHYSICAL EXAMINATION:  GENERAL:  Well-developed, well-nourished white male  in no apparent distress.  Alert and oriented x 4.  Resting comfortably in  bed.  VITAL SIGNS:  Temperature is 97.2, pulse 86 and in sinus, respiratory rate  of 20, and his blood pressure was 126/64, 99% on 2 L.  HEENT:  Acephalic.  Pupils are equal and reactive to light.  Extraocular  movements are intact.  Sclerae are without significant lesions.  His  funduscopic exam is difficult but shows no significant Roth's spots.  His  conjunctivae are without significant lesions.  Oropharynx is without  lesions, and his bilateral ears also are without lesions.  NECK:  Supple.  With no jugular venous distention or carotid bruits.  There  is no lymphadenopathy noted.  CHEST:  Clear to auscultation.  CARDIAC:  Reveals a nondisplaced point of maximal impulse with no lifts or  thrills.  First and second heart sounds are normal.  There is no third heart   sound, but there is a fourth heart sound.  There are no murmurs noted.  ABDOMEN:  Soft and nontender.  Normoactive bowel sounds with no  hepatosplenomegaly.  GENITOURINARY, RECTAL:  Deferred.  EXTREMITIES:  Without clubbing, cyanosis, or edema.  There are no Osler's  nodes or Janeway lesions.  The lower extremity exam reveals no clubbing,  cyanosis, or edema, with the left knee with a recently healed surgical  incision.  NEUROLOGIC:  Nonfocal.   LABORATORY DATA:  Chest x-ray is not available at the time of this  dictation.   Electrocardiogram shows sinus rhythm at a rate of 98 with normal intervals,  normal axes.  No ST and T-wave changes, and no Q-waves indicative of  ischemia or infarction.   His white blood cell count is 5.2 with an H&H of 12 and 36.  Sodium is 139,  potassium 3.7, chloride of 104, bicarbonate of 26, BUN of 7, creatinine of  0.8, and his blood sugar is 93.  His liver function studies are normal.  His  total protein and albumin are normal.  Three sets of cardiac enzymes are  inconsistent with myocardial infarction.  No coagulation studies are done.  His room air blood gas is 7.44, pCO2 of 37.3, pO2 of 74, and an O2  saturation of 96%.   In conversation with his infectious disease doctor, Dr. Darlina Sicilian, the  patient had a Staphylococcus aureus wound infection of his knee with no  known bacteremia.  His impression is low likelihood of endocarditis.   ASSESSMENT:  Our assessment is chest discomfort which is atypical for  coronary disease in a patient with significant risk factors for coronary  disease.  He has had multiple workups in the past, and has actually a known  etiology for his chest pain being a pulmonary embolus.  He has undergone a  CT scan for pulmonary embolus protocol, and that was negative, with no DVT,  and his d-dimers are pending.  I think the likelihood of pulmonary embolus is low.  We will pursue an adenosine Cardiolite tomorrow and do an   echocardiogram today to rule out infectious endocarditis.  If this is not  adequate, will do a transesophageal echocardiogram tomorrow afternoon.                                               Vida Roller, M.D.    JH/MEDQ  D:  03/06/2002  T:  03/06/2002  Job:  [id number]

## 2010-05-20 NOTE — Discharge Summary (Signed)
NAME:  Paul Baker, Paul Baker                      ACCOUNT NO.:  [id number]   MEDICAL RECORD NO.:  [id number]                   PATIENT TYPE:  INP   LOCATION:  5015                                 FACILITY:  [facility]   PHYSICIAN:  Elana Alm. [name], M.D.             DATE OF BIRTH:  [birth year]   DATE OF ADMISSION:  07/19/2001  DATE OF DISCHARGE:  07/23/2001                                 DISCHARGE SUMMARY   PRIMARY DISCHARGE DIAGNOSIS:  End-stage osteoarthritis left knee.   SECONDARY DIAGNOSES:  1. Past history of deep venous thrombosis and pulmonary embolism.  2. Hypertension.  3. Coronary artery disease.  4. Noninsulin-dependent diabetes.   SURGICAL PROCEDURE:  Left total knee arthroplasty by Dr. [name] with the  assistance of Haze Rushing, P.A.-C on July 19, 2001.  Please refer to the  operative note for further details.   CONSULTATIONS:  None.   LABORATORY DATA:  Preop EKG on July 18th showed a normal sinus rhythm,  normal EKG.  Chest x-ray from May 01, 2001 showed stable mild, chronic  bronchiectatic changes.  Preop H&H were 13.0 and 38.6 respectively.  It  reached a low of 9.0 on the 19th and improved to 10.0 on the 20th.  PT, INR  and PTT preop at 12.0, 0.8 and 30 respectively.  INR prior to discharge was  1.4.  Urinalysis, preop, was all within normal limits with negative  parameters.   CHIEF COMPLAINT:  Left knee pain.   HPI:  Paul Baker is a 60 year old male who had significantly worsening  left knee pain.  He has undergone multiple surgical procedures to his left  knee over the past number of years.  He even developed postoperative  infection at one point.  He has tried conservative measures consisting of  cortisone injections and Synvisc injections without improvement.  Due to the  lack of improvement with conservative measures and his findings on clinical  and radiographic exams he elected to undergo surgical intervention on his  knee.  We had received  preoperative medical evaluation and clearance by Dr.  [name], who performed cardiac stress test and came back within normal  acceptable parameters.  He was admitted on July 18th for surgical  intervention.   HOSPITAL COURSE:  Following the surgical procedure the patient was taken to  PACU in stable condition, transferred to the orthopedic floor in good  condition.  He received routine postoperative pain medications and  antibiotics.  He was on PCA with good pain control.  As the patient had a  significant history of DVT in the past he was covered with Lovenox and  Coumadin postoperatively as well as TED hose and PlexiPulse.  Hemoglobin  remained stable postop and he did not require blood transfusion.  He is  starting on physical therapy, weightbearing as tolerated and using CPM.   He progressed well with physical therapy.  He did run a moderate temperature  postop which  is felt to be secondary to atelectasis.  On postop day two his  wound was examined and found to be clean, dry and intact without signs or  symptoms of infection.  Because of this continued temperature on postop day  three, a chest x-ray and UA were obtained which were both negative for  infectious process.  The chest x-ray did support atelectasis at the bases.  With continued use of the incentive spirometer, temperature resolved and he  remained afebrile for 24 hours.  He progressed well with physical therapy,  wound looked good and he is stable and ready for discharge home.   DISPOSITION:  The patient is being discharged home on July 23, 2001.  He is  being discharged home with [facility] [place].   DIET:  Diet is ADA 2000 calorie.   FOLLOWUP:  Follow up with Dr. [name] in two weeks.  Call [phone] for an  appointment.   ACTIVITY:  Total knee protocol.  Weightbearing as tolerated.  Home CPM with  current settings and using 6-8 hours a day and increasing 5-10 degrees a  day.  TED hose.  It is okay to shower once  daily starting tomorrow.   DISCHARGE MEDICATIONS:  He is to resume his home medications.  Also Lovenox  per pharmacy dosing for an INR of less than 2.0 and Coumadin per pharmacy  dosing for a total of four weeks postop.  Vicodin 5/500 1 or 2 every 4-6  hours as needed for pain and Robaxin 500 mg p.o. q.8 hours p.r.n. spasm.   CONDITION ON DISCHARGE:  Good and improved.     Marcie Bal Troncale, P.A.-C.              Aydden A. [name], M.D.    ADT/MEDQ  D:  08/01/2001  T:  08/07/2001  Job:  [id number]

## 2010-05-20 NOTE — H&P (Signed)
NAME:  Paul Baker, Paul Baker NO.:  [id number]   MEDICAL RECORD NO.:  [id number]                    PATIENT TYPE:   LOCATION:                                       FACILITY:   PHYSICIAN:  Erasmo Leventhal, M.D.         DATE OF BIRTH:  04-14-1950   DATE OF ADMISSION:  01/31/2002  DATE OF DISCHARGE:                                HISTORY & PHYSICAL   CHIEF COMPLAINT:  Question septic total knee replacement, left knee.   HISTORY OF PRESENT ILLNESS:  This is a 60 year old gentleman who has  undergone a total knee replacement who has had a persistent effusion in his  total knee. He has been worked up for loosening and has had no evidence of  loosening and no overt evidence of infection with no fevers, no redness,  warmth or other sign of infection other than continued persistent effusion.  This has been tapped and cultured and no growth has occurred. Due to  continued accumulation of effusion, the patient was brought to the  outpatient OR today for I&D. There a large clear effusion was noted as well  as perfuse grayish soft tissue throughout the knee. Cultures, AFB, fungal  were taken. A drain was subsequently placed and the patient was admitted for  overnight observation. Due to the probability of infection, the patient will  subsequently discharged from the outpatient center in the morning and  admitted to [facility] in the a.m. for IV antibiotics and I&D  consult. As well, he will be started on Lovenox tomorrow due to his previous  DVT with PE. The nature of this is discussed in detail with the patient and  his family and they concur with the treatment plan.   PAST MEDICAL HISTORY:  Drug allergy to CODEINE.   CURRENT MEDICATIONS:  1. Cozaar 100 mg daily.  2. Avandia 8 mg daily.  3. Lipitor 10 mg daily.  4. Hydrochlorothiazide 12.5 mg daily.   PAST SURGICAL HISTORY:  Twenty-one knee procedures for congenital knee  abnormalities and four  wrist surgeries. Also a tonsillectomy.   SERIOUS MEDICAL ILLNESSES:  1. Diabetes.  2. Hypertension.  3. History of DVT with PE.   SOCIAL HISTORY:  The patient is married, he does not smoke or drink.   FAMILY HISTORY:  Positive for cancer, hypertension, kidney failure and  coronary artery disease.   REVIEW OF SYMPTOMS:  CENTRAL NERVOUS SYSTEM:  Negative for headache, blurred  vision or dizziness. PULMONARY:  Positive for history of DVT with PE but  currently no problems. CARDIOVASCULAR:  Negative for chest pain and  palpitations. Positive for history of hypertension. GI:  Negative for  ulcers, hepatitis. GU:  Negative for urinary tract difficulty.  MUSCULOSKELETAL:  Positive as in HPI.   PHYSICAL EXAMINATION:  VITAL SIGNS:  Blood pressure 140/100, pulse 88 and  regular, respirations 18.  HEENT:  Head normocephalic. Nose patent, ears patent, pupils equal, round  and reactive to light. Throat without injection.  NECK:  Supple without adenopathy. Carotids 2+ without bruits.  CHEST:  Clear to auscultation. No rales or rhonchi. Respirations 18.  HEART:  Regular rate and rhythm at 88 beats per minute without murmur.  ABDOMEN:  Soft with active bowel sounds. No masses or organomegaly.  NEUROLOGIC:  The patient alert and oriented to time, place and person.  Cranial nerves II-XII are grossly intact.  EXTREMITIES:  Left knee is status post I&D, he has a drain in place.  Neurovascularly the leg is intact with normal sensibility and normal pulses.  He is having minimal drainage at this time and his dressing is dry.   ASSESSMENT:  Question septic total knee replacement.   PLAN:  Discharge from the outpatient center in the morning and subsequent  admission to the [facility] for IV antibiotics, I&D consult and  Lovenox for DVT prophylaxis.     Jaquelyn Bitter. Chabon, P.A.                   Erasmo Leventhal, M.D.    SJC/MEDQ  D:  01/30/2002  T:  01/30/2002  Job:  [id number]

## 2010-05-20 NOTE — H&P (Signed)
NAME:  Paul Baker, Paul Baker                      ACCOUNT NO.:  [id number]   MEDICAL RECORD NO.:  [id number]                   PATIENT TYPE:  INP   LOCATION:  NA                                   FACILITY:  [facility]   PHYSICIAN:  Ebbie Ridge. Paitsel, P.A.               DATE OF BIRTH:  March 29, 1950   DATE OF ADMISSION:  08/13/2003  DATE OF DISCHARGE:                                HISTORY & PHYSICAL   HISTORY:  The patient had a left total knee infection.  He originally had a  left total knee arthroplasty in July 2003 by Dr. [name].  In May 2005 Dr.  Charlann Boxer removed the prosthesis secondary to infection.  The patient has  undergone antibiotic therapy.  He now shows no sign of infection.  The  patient will be admitted to [facility] to undergo left knee  resection arthroplasty.   ALLERGIES:  CODEINE causes a rash.   The patient's primary care Kendrea Cerritos is W. Simone Curia, M.D., in  [place].   PAST MEDICAL HISTORY:  1. Noninsulin-dependent diabetes.  2. Osteoarthritis.  3. Infected left total knee.  4. Hypercholesterolemia.  5. Hypertension.  6. The patient also had previous DVTs in his legs, arm, and lungs, 2004.   CURRENT MEDICATIONS:  1. Cozaar 100 mg daily.  2. Avandia 8 mg daily.  3. HCTZ 37.5 mg daily.  4. Metformin 500 mg two tablets twice daily.  5. Zetia 10 mg daily.   FAMILY HISTORY:  Significant for mother with hypertension, liver cancer;  father, hypertension, CVA, and arthritis.   REVIEW OF SYSTEMS:  GENERAL:  Denies weight change, fever, chills, fatigue.  HEENT:  Denies headache, visual changes, tinnitus, hearing loss, sore  throat.  CARDIOVASCULAR:  Denies chest pain, palpitations, shortness of  breath, orthopnea.  PULMONARY:  Denies dyspnea, wheezing, cough, sputum  production, or hemoptysis.  GASTROINTESTINAL:  Denies dysphagia, nausea,  vomiting, hematemesis, or abdominal pain.  GENITOURINARY:  Denies dysuria,  frequency, urgency, hematuria.  ENDOCRINE:   Denies polyuria, polydipsia,  appetite change, or heat or cold intolerance.  MUSCULOSKELETAL:  Denies  swelling, stiffness, cramping.  He does have some pain in his left knee.  He  is wearing a knee immobilizer.  NEUROLOGIC:  Denies dizziness, vertigo,  syncope, seizure.  SKIN:  Denies itching, rashes, masses, or moles.   PHYSICAL EXAMINATION:  VITAL SIGNS:  Temperature 98.1, pulse 84,  respirations 18, blood pressure 130/80 left arm sitting.  GENERAL:  A 60 year old male in no acute distress.  HEENT:  PERRL, EOMs intact.  Pharynx clear.  TMs intact.  NECK:  Supple without masses.  CHEST:  Clear to auscultation bilaterally.  CARDIAC:  Regular rate and rhythm without murmur.  ABDOMEN:  Positive bowel sounds, obese, soft, nontender.  EXTREMITIES:  Examination of his left lower extremity reveals a knee  immobilizer to be in place.  The incision is intact.  Slight swelling, no  sign of  infection.  SKIN:  Warm and dry.   IMPRESSION:  Resolved infection, left knee.   PLAN:  The patient is to be admitted to [facility] August 13, 2003, to undergo resection arthroplasty of left total knee by Dr. Charlann Boxer.                                               Ebbie Ridge. Paitsel, P.A.    LKP/MEDQ  D:  08/07/2003  T:  08/07/2003  Job:  [id number]

## 2010-05-20 NOTE — Procedures (Signed)
   NAME:  Paul Baker, Paul Baker                      ACCOUNT NO.:  [id number]   MEDICAL RECORD NO.:  [id number]                   PATIENT TYPE:  OUT   LOCATION:  RAD                                  FACILITY:  [facility]   PHYSICIAN:  Donna Bernard, M.D.             DATE OF BIRTH:  03-24-1950   DATE OF PROCEDURE:  DATE OF DISCHARGE:  07/04/2001                                    STRESS TEST   INDICATIONS FOR STRESS TEST:  The patient has had atypical chest discomfort  and due to have major surgery soon, with complete knee replacement.   Stress test was performed with an Adenosine Cardiolite protocol.  Adenosine  dosaging was determined by the pharmacy.  This was done due to the patient's  severe joint problems which keeps him from exercising.  Resting EKG revealed  normal sinus rhythm with no significant ST-T changes.  The patient tolerated  the Adenosine injection well with minimal ST segment changes.  His resting  EKGs following injection remained within normal limits.   IMPRESSION:  Negative stress test awaits Cardiolite examination.                                                Donna Bernard, M.D.    Karie Chimera  D:  07/31/2001  T:  08/06/2001  Job:  [id number]

## 2010-05-20 NOTE — Procedures (Signed)
   NAME:  Paul Baker, Paul Baker                      ACCOUNT NO.:  [id number]   MEDICAL RECORD NO.:  [id number]                   PATIENT TYPE:  INP   LOCATION:  A207                                 FACILITY:  [facility]   PHYSICIAN:  Vida Roller, M.D.                DATE OF BIRTH:  01/08/1950   DATE OF PROCEDURE:  03/07/2002  DATE OF DISCHARGE:                                    STRESS TEST   PROCEDURE:  Adenosine Cardiolite.   INDICATIONS FOR PROCEDURE:  The patient is a 60 year old male with no known  coronary artery disease with a negative adenosine Cardiolite last year.  He  now presents with atypical chest discomfort.   BASELINE DATA:  EKG:  Sinus rhythm at 97 beats per minute with nonspecific  ST changes.  Blood pressure 144/78.   Fifty-six mg of adenosine was infused over 4-minute protocol with Cardiolite  injected at 3 minutes.  The patient reported abdominal tightness, shortness  of breath, and flushing which all resolved in the recovery period.  EKG  showed no ischemic changes and no arrhythmias.  Final images and results are  pending M.D. review.     Amy Mercy Riding, P.A. [facility]                     Vida Roller, M.D.    AB/MEDQ  D:  03/07/2002  T:  03/07/2002  Job:  [id number]

## 2010-05-20 NOTE — Discharge Summary (Signed)
   NAME:  BERNICE, MULLIN                      ACCOUNT NO.:  [id number]   MEDICAL RECORD NO.:  [id number]                   PATIENT TYPE:  INP   LOCATION:  A207                                 FACILITY:  [facility]   PHYSICIAN:  Donna Bernard, M.D.             DATE OF BIRTH:  [birth year]   DATE OF ADMISSION:  03/05/2002  DATE OF DISCHARGE:  03/07/2002                                 DISCHARGE SUMMARY   FINAL DIAGNOSES:  1. Chest pain, myocardial infarction ruled out.  2. History of deep vein thrombosis in the past.  3. Recent Staphylococcus infection of knee, currently on vancomycin.  4. Hypertension.  5. Type 2 diabetes.   FINAL DISPOSITION:  1. The patient discharged to home.  2. The patient to continue with vancomycin infusions as noted.  3. The patient to follow up in the office in one week.   INITIAL HISTORY AND PHYSICAL:  Please see H&P as dictated.   HOSPITAL COURSE:  This patient is a 60 year old male with a history of  multiple prior DVTs and prior pulmonary emboli who is currently on daily  vancomycin infusion for a staph infection of the knee.  The patient  experienced a significant episode of chest discomfort with radiation to the  jaw.  With his PE history the patient had a CT of the chest which was  negative.  The patient was admitted to the hospital.  Serial cardiac enzymes  were performed and these proved to be negative.  The cardiologists were  consulted and they did a stress Cardiolite; this was negative.  On day of  discharge the patient was better, and discharged home with diagnoses and  disposition as noted above.                                               Donna Bernard, M.D.    Karie Chimera  D:  03/25/2002  T:  03/26/2002  Job:  [id number]

## 2010-05-20 NOTE — Op Note (Signed)
NAME:  Paul Baker, Paul Baker                      ACCOUNT NO.:  [id number]   MEDICAL RECORD NO.:  [id number]                   PATIENT TYPE:  INP   LOCATION:  0470                                 FACILITY:  [facility]   PHYSICIAN:  Madlyn Frankel. Charlann Boxer, M.D.               DATE OF BIRTH:  Mar 20, 1950   DATE OF PROCEDURE:  08/13/2003  DATE OF DISCHARGE:                                 OPERATIVE REPORT   PREOPERATIVE DIAGNOSIS:  Status post resection arthroplasty for infection,  left knee.   POSTOPERATIVE DIAGNOSIS:  Status post resection arthroplasty for infection,  left knee.   PROCEDURE:  Left total knee reimplantation.   COMPONENTS USED:  DePuy knee revision system with a size 3 femur, 8 mm  distal medial augments, 12 mm distal augments laterally, a 4 mm  posterolateral augment, a size 3 revision rotating platform tray with no  augmentation.  Note that the tibial stem had a 30 mm x 13 cemented stem.  Femoral component had a 90 mm x 15 mm cemented stem, a 12.5 mm poly was used  in a 38 patella button.   SURGEON:  Durene Romans, MD   ASSISTANT:  Cherly Beach, PA-C   ANESTHESIA:  General.   BLOOD LOSS:  150 mL.   TOURNIQUET TIME:  120 minutes at 300 mmHg.   COMPLICATIONS:  None apparent.   DRAINS:  One.   INDICATION FOR PROCEDURE:  Mr. Gusler is a 60 year old gentleman, who is  status post a total knee replacement that was complicated by draining  wounds.  He had had multiple knee aspirations with a single aspiration that  came back positive for infection.  He had undergone an I&D and antibiotic  treatment and was never satisfied that the infection was cleared from his  knee.  Multiple aspirations had been performed.  He had been referred over  for evaluation and definitive treatment from Dr. Hayden Rasmussen.  After  extensive discussions with the patient and another attempted aspiration, we  discussed treatment of infected knee replacement in terms of two-stage  technique of  resection arthroplasty followed by reimplantation after a  course of antibiotics.  After discussing all the risks and benefits  associated with this and his current condition, he wished to proceed with  this and signed consent.   The patient underwent a resection arthroplasty approximately 9 weeks ago.  He underwent resection at that time with no bacteria determined in the final  cultures; however, he was still managed with IV antibiotics for a course of  6 weeks.  At 2 weeks afterwards, he came to the office at which point we  attempted aspiration which was negative for any fluid.  After reviewing  again the risks and benefits of reimplantation and his current condition, he  consented for reimplantation of left total knee replacement.   PROCEDURE IN DETAIL:  The patient was brought to the operative theatre.  Once adequate anesthesia and  preoperative antibiotics were administered, the  patient was positioned supine on the operating table.  His left lower  extremity was then prepped and draped in a sterile fashion.  His previous  midline incision was excised, with sharp dissection was carried down to the  level of the extensor mechanism.  Careful dissection at this level was  carried out to create flaps to allow for exposure.  Medial parapatellar  arthrotomy was carried out.  The patient had a previous lateral retinacular  release which was closed at the initial resection arthroplasty.  These  sutures were kept in place.  Medial parapatellar arthrotomy was carried out  with careful dissection was carried out trying to re-curette the  suprapatellar pouch and the medial and lateral gutters.  No quad snip nor  tibial tubercle osteotomy was carried out during the procedure.  Through  careful dissection, eventually the extensor mechanism was able to be everted  and normal exposure obtained.  At this point, attention was directed to  removal of the spacer blocks which were done intact.  The  debridement of the  knee was carried out.  Attention was first directed to canal preparation.  Hand reaming was carried up to a size 15 on the tibia and to a 17 on the  femur.  Tibial cut was then made with an intramedullary device.  Approximately 2 mm of bone were resected as a clean-up cut.  Note that some  exposure on the lateral side was difficult, and so based on the  intramedullary position of the component, there may have been slight more  millimeter or two of bone resected laterally; however, this was noted later  in the case during trial with a stem component and felt to be insignificant  enough that cement would augment it without complication.  Following  preparation of the tibia cut, attention was directed to the femur.  The  distal femoral cutting block was placed on the left side, 5 degrees valgus.  It was determined at this point that there was approximately a significant  amount of bone. There had been some subsidence of the component.  Based off  of the portion of the medial femoral condyle distally that was still  existing, there was about a 4 mm loss of bone medially an 8 mm laterally.  A  clean-up cut was made on this medial side and also on the lateral side with  the noted deficiencies.  Based on the tightness in flexion with the scarring  posteriorly, determined that we would increase the augmentation on the  distal aspect of the femur to 8 and 12 medial and laterally, respectively.  With the spacer block in place, the rotation was determined.  With a plus 2  bolt placed to drop the femoral component down as far posterior as possible  along the anterior cortex.  It was felt that the previous cut had been made  in flexion, too, in that the actual position of the femoral component at  this point would need to be augmented with cement but would be appropriately  fixed and not elevated.  Based on the previous cuts and rotation, there was a deficit of bone on the  posterolateral aspect of the femur that required  augmentation with 4 mm to maintain an externally rotated posture of the  femoral component.  With all the cuts made and debridement carried out, a  trial femoral component was a 90 mm stem, an 8 mm distal medial, 12 mm  distal lateral, and 4 mm posterolateral augments in place, a size 3 tibial  component was placed.  Initially, we tried a 10 mm poly followed by a 12  which provided excellent stability in extension and in flexion.  At this  point, with the trial components in place, attention was directed to  preparation of the patella, and a fresh 1 mm cut was made from a previous  patella button.  The final cut measured about 14 mm in thickness.  A 38  button fit perfectly and drill holes placed.  At this point, attention was  directed to final preparation of the tibia with rotation marked and thus  could be matched.  The size 3 tray was placed onto the tibial cut surface.  Rotation was marked and keel punch made.  At this point, the tourniquet was  let down.  Final components were brought to the field.  The knee was  copiously irrigated with normal saline solution, and a final debridement was  carried out.  After a 10 minute period of time, the leg was exsanguinated  and tourniquet re-elevated.  The knee was again cleaned out and dried.  Cement was mixed with 2 bags of cement initially done with 1 g of vancomycin  powder.  Once the cement was ready, the tibial component was cemented into  position at the appropriate rotation.  Trial femoral component was placed,  the knee brought to extension, and excessive cement removed.  At this point,  the cement was remixed.  Cement was placed into the femoral side, and the  femoral component was cemented into position.  Again, 2 bags of cement were  used with 1 g of vancomycin.  The final femoral component was placed with  the size 12 spacer, knee brought to extension.  The patellar component was   then cemented into position.  Excessive cement was removed during the curing  process of the cement.  Once cement had fully cured, the trial liner was  removed; excessive cement was removed off the posterior aspect of the femur  and tibia.  Tray was then irrigated, dried, and free of any debris, and the  final 12.5 mm, size 3 rotating platform posterior stabilized tray was then  placed into the tray.  The patient's knee was then reduced,  and the knee was copiously irrigated with Bacitracin-laden fluid.  At this  point, the medium Hemovac drain was placed.  The wound was closed in layers  with #1 PDS and 2-0 Vicryl and skin staples.  The patient tolerated the  procedure well without complication and was transferred to the recovery  room, extubated in stable condition.                                               Madlyn Frankel Charlann Boxer, M.D.    MDO/MEDQ  D:  08/13/2003  T:  08/13/2003  Job:  [id number]

## 2010-05-20 NOTE — Discharge Summary (Signed)
Paul Baker, Paul Baker                        ACCOUNT NO.:  [id number]   MEDICAL RECORD NO.:  [id number]                   PATIENT TYPE:   LOCATION:                                       FACILITY:   PHYSICIAN:  Jeoffrey Massed, M.D.             DATE OF BIRTH:  December 20, 1950   DATE OF ADMISSION:  08/22/2002  DATE OF DISCHARGE:  08/24/2002                                 DISCHARGE SUMMARY   ADMISSION DIAGNOSES:  1. Hematuria.  2. Excessive anticoagulation.   DISCHARGE DIAGNOSES:  1. Hematuria.  2. Excessive anticoagulation.   CHRONIC PROBLEM LIST:  1. Recurrent thromboses.  2. Arthritis.  3. Diabetes.  4. History of colon polyps.  5. Restless leg syndrome.  6. Spinal disk disease.   DISCHARGE MEDICATIONS:  1. Cozaar 100 mg daily.  2. Zocor 20 mg daily.  3. Avandia 8 mg daily.  4. Doxycycline 100 mg p.o. b.i.d.  5. Maxzide 37.5/25 one tablet daily.  6. Rifampin 300 mg twice daily.  7. Protonix 40 mg daily.   CONSULTATIONS:  None.   PROCEDURES:  None.   HISTORY OF PRESENT ILLNESS:  For complete H&P, please see dictated H&P in  chart.  Briefly, this is a 60 year old white male, on chronic high dose  Coumadin therapy, for recurrent thromboses.  He had undergone left knee  replacement several months before admission and had an ongoing staph  infection which was being treated with Rifampin and doxycycline.  These  medications required titration of his Coumadin to quite high doses, up to 30  mg daily.  Several weeks before the present admission, these antibiotics  were discontinued and subsequent followup of his INR revealed 14.7 on  admission.  On the day of admission, he presented to his primary MD with  hematuria and some flank and lower back pain as well as intermittent  abdominal pain.  His INR was as noted and he was admitted for further  imaging, to rule out intra-abdominal or pelvic bleeding and for vitamin K  therapy.   HOSPITAL COURSE:  Excessive  anticoagulation and hematuria:  The patient's  hematuria gradually resolved and was scant on the day of discharge.  His  abdominal and flank pain resolved completely.  CT scans of his abdomen and  pelvis and his head revealed no bleeding.  His hemoglobin was 12.6 on the  day of admission.  Followups of his INR after two doses of subcu vitamin K,  showed an INR of 8.6.  He was then given one more dose of oral vitamin K 2.5  mg and the INR the next day was 1.9.  Due to no further bleeding found, and  normalization of his INR, he was deemed ready for discharge and was told to  restart his Coumadin at his regular home dose of 30 mg.  Of note, he is back  on Rifampin and doxycycline for the last several days.  He will  be in  contact with his Infectious Disease clinic on whether or not to continue  this in the near future and will also followup with W. Simone Curia, M.D.,  his primary MD in about three days.   DISCHARGE INSTRUCTIONS:  The patient was discharged home with instructions  to continue diabetic diet and no restrictions were placed on activity.  He  was instructed to call for any further bleeding or questions.                                                Jeoffrey Massed, M.D.    PHM/MEDQ  D:  08/24/2002  T:  08/24/2002  Job:  [id number]

## 2010-05-20 NOTE — H&P (Signed)
NAME:  Paul Baker, Paul Baker                      ACCOUNT NO.:  [id number]   MEDICAL RECORD NO.:  [id number]                   PATIENT TYPE:  INP   LOCATION:  A328                                 FACILITY:  [facility]   PHYSICIAN:  Scott A. [name], M.D.               DATE OF BIRTH:  06/09/1950   DATE OF ADMISSION:  08/22/2002  DATE OF DISCHARGE:                                HISTORY & PHYSICAL   CHIEF COMPLAINT:  Hematuria.   HISTORY OF PRESENT ILLNESS:  This is a 60 year old white male who presents  to the office with gross hematuria.  He states that he noticed this starting  last night and several times since then.  It has been painless.  The patient  states the urine has had a deep burgundy color.  He denies any fever,  chills, pain.  He does relate some intermittent abdominal discomfort and  back discomfort, in addition to this.  He states he also had some fluid  drawn off the left knee yesterday at the orthopedist's office and there was  blood in that as well.  The patient has a history of blood clot and takes  medication on an ongoing basis for that.   PAST MEDICAL HISTORY:  1. Reoccurring blood clot.  2. Arthritis style symptoms.  3. Diabetes.  4. History of colon polyps.  5. Restless leg syndrome.  6. Spinal disk disease.   FAMILY HISTORY:  HT, diabetes, and heart disease.   ALLERGIES:  CODEINE causes nausea.   MEDICATIONS:  1. Cozaar 100 mg daily.  2. Aspirin 325 mg daily.  3. Xanax 1 mg at bedtime p.r.n.  4. HCTZ 12.5 mg one q.a.m.  5. Avandia 8 mg one daily.  6. Rifampin 300 mg one b.i.d.  7. Doxycycline 100 mg one b.i.d.  8. Lipitor 10 mg one daily.  9. Percocet one q.4 h. p.r.n. pain  10.      Coumadin 30 mg daily.   REVIEW OF SYSTEMS:  See per above.  The patient relates moderate headache  over the past few days.  Also relates intermittent lower abdominal pain and  back pain over the past several days.   PHYSICAL EXAMINATION:  GENERAL:  NAD.  NEUROLOGICAL:   Grossly normal.  EOMI.  Pupils respond to light.  HEENT:  TMs clear.  NECK:  Supple.  CHEST:  CTA.  HEART:  Regular.  ABDOMEN:  Soft. No guarding or rebound.  EXTREMITIES:  No edema.   LABORATORY DATA:  Urine:  Gross hematuria is noted.  PT/INR shows 14.7 INR.  Hemoglobin 13.1.  Sodium 132, glucose 115, BUN 11, creatinine 1.0.  SGOT 43,  bilirubin 1.8.   ASSESSMENT/PLAN:  1. Gross hematuria.  I feel this is due to excessive anticoagulation.  2. Excessive anticoagulation.  I feel this is secondary to interactions of     the antibiotics with his Coumadin.  Feel because of the severe elevation  of INR and the gross hematuria, he needs a vitamin K shot now and later     this afternoon along with repeat of INR in the morning.  Target INR to be     between 2-3 and at that point in time, maintain the proper amount of     Coumadin to keep it within that level.  Certainly this might be able to     be obtained as an outpatient once the INR gets out of a dangerous range.     Also, order a CT scan of the head because of the headache and a CT scan     of the abdomen and pelvis to rule out incidental bleed.                                               Scott A. [name], M.D.    Linus Orn  D:  08/22/2002  T:  08/22/2002  Job:  [id number]

## 2010-05-20 NOTE — H&P (Signed)
NAME:  Paul Baker, Paul Baker                      ACCOUNT NO.:  [id number]   MEDICAL RECORD NO.:  [id number]                   PATIENT TYPE:  INP   LOCATION:  A207                                 FACILITY:  [facility]   PHYSICIAN:  Sarita Bottom, M.D.                  DATE OF BIRTH:  04/11/1950   DATE OF ADMISSION:  03/05/2002  DATE OF DISCHARGE:                                HISTORY & PHYSICAL   DICTATED BY:  Burnice Logan, M.D.   CHIEF COMPLAINT:  Chest pain.   HISTORY OF PRESENT ILLNESS:  This is a 60 year old Caucasian gentleman who  had sudden onset of precordial chest pain radiating to his jaw this  afternoon.  The patient had just completed daily Vancomycin infusion at a  specialty clinic for Staph knee infection and was on his way home with his  wife when he had the chest pain.  He had his wife turn around to the  specialty clinic because the pain was so severe.  The pain radiated to the  jaw, it was associated with diaphoresis and shortness of breath.  He felt  nauseous but did not vomit.  He states that he always feels nauseated after  taking the Vancomycin treatments, so he is not sure if this was related to  his chest pain at all.  At the specialty clinic, his blood pressure was  checked and was found to be elevated.  He was transferred to the emergency  department.  His pain was severe and greater than 10/10.  He was treated in  the emergency department with aspirin and started on a nitroglycerin drip  after sublingual nitroglycerin had failed to relieve his pain.  He became  pain-free while on nitroglycerin infusion and was pain-free at the time I  saw him.  It is noted that the patient had a CT of his chest to evaluate for  PE while in the emergency room and this study was negative.   PAST MEDICAL HISTORY:  1. Hypertension.  2. Diabetes mellitus.  3. Multiple bilateral knee surgeries with prosthesis and put on knee     replacement of his left knee in 7/03. This was  complicated by Staph     infection for which he takes daily Vancomycin infusions at a specialty     clinic.  He has concluded 16 days of such treatments.  4. Right wrist fusion.  5. History of tonsillectomy.  6. History of DVT and PE.  7. Hyperlipidemia.  8. Denies history of myocardial infarction.   ALLERGIES:  Codeine.   CURRENT MEDICATIONS:  Cozaar 100 mg daily, hydrochlorothiazide 12.5 mg  daily, Avandia 80 mg daily, Lipitor 10 mg daily, Rifampin 200 mg b.i.d. and  Vancomycin daily infusions.   FAMILY HISTORY:  The patient's daddy had kidney failure, heart disease and  multiple strokes.   SOCIAL HISTORY:  Patient is married.  He previously worked for [employer] and  [employer], but currently is disabled.  He denies smoking or use of alcohol.   REVIEW OF SYSTEMS:  GENERAL:  Has had no fever or chills.  NEUROLOGIC:  Denies any seizure activity.  Has headaches after nitroglycerin infusion was  started.  CARDIORESPIRATORY:  Please refer to History of Present Illness.  The patient states that he has had chest congestion in recent times and was  due to see Dr. Kennis Carina in his office.  GASTROINTESTINAL:  Nausea, no abdominal  pain or vomiting. GENITOURINARY:  No dysuria or hematuria.  MUSCULOSKELETAL:  Occasional knee pain but no joint swelling.  All other systems reviewed and  negative.   PHYSICAL EXAMINATION:  GENERAL:  This is a middle-aged obese white gentleman  laying supine and in no acute distress.  VITAL SIGNS:  On arrival, temperature 98, blood pressure 160/91, current  blood pressure 129/61.  Heart rate is 110, respiratory rate 20/min.  HEENT:  Normocephalic.  He has equal sized pupils.  Extraocular movements  are intact.  Mucous membranes are moist.  Nose patent with no discharge.  Oropharynx is unremarkable.  NECK:  Obese, supple with no JVD and no carotid bruit.  LUNGS:  Clear to auscultation, no rhonchi or rales appreciated.  CARDIOVASCULAR:  Heart sounds 1 and 2 are heard and  regular.  Apex is  difficult to locate.  ABDOMEN:  Obese, soft, nontender with normal bowel sounds, no organomegaly.  CNS:  Patient is alert, oriented x3.  Cranial nerves II-XII are grossly  intact.  He is able to move all extremities equally.  EXTREMITIES:  Surgical scars noted over both knees and right wrist.  He has  no pedal edema.   RADIOLOGY REPORTS AND LABORATORY DATA:  Preliminary x-ray report is negative  for any infiltrates and PE study as well as DVT study are all negative.  EKG:  Sinus rhythm at 98/min. with no acute ST elevations.  WBC count 5.2,  hemoglobin 12, hematocrit 36, platelet count 244.  CPK 96, MB fraction 1.0,  troponin is less than 0.01.  Glucose 93, sodium 139, potassium 3.7, BUN 7,  creatinine 0.8.   ASSESSMENT AND PLAN:  1. Chest pain.  The patient has multiple risk factors for coronary and     kidney disease including his age, sex, obesity, hyperlipidemia,     hypertension and diabetes mellitus as well as a positive family history.     He warrants admission to telemetry to rule out for myocardial ischemia.     The patient will have serial cardiac enzymes.  Will discontinue the IV     nitroglycerin as he currently is chest pain-free.  Will continue his     usual medications and request a cardiology consult for stress testing in     the morning.  2. Hypertension, which is currently stable.  3. Diabetes mellitus on Avandia.  Will continue and monitor his blood     glucose.  The patient will be on an 1800 calorie ADA diet.  4. Hyperlipidemia for which the patient is already on a statin agent.  5. Recent Staph knee infection, on Vancomycin.  Will need to set Vancomycin     doses with specialty clinic in the morning so the patient could continue     having his infusions through his PIC line while he is an admission.  6. Dr. Cardell Peach will attend while the patient is in the hospital.  Sarita Bottom, M.D.    DW/MEDQ  D:  03/05/2002  T:  03/05/2002  Job:  [id number]   cc:   Cardell Peach, M.D.

## 2010-05-20 NOTE — Discharge Summary (Signed)
[facility]  Patient:    [name], Paul Baker Visit Number: [id number] MRN: [id number]          Service Type: OBS Location: 2A A202 01 Attending Physician:  Harlow Asa Dictated by:   Donna Bernard, M.D. Admit Date:  05/01/2001 Discharge Date: 05/02/2001                             Discharge Summary  FINAL DISCHARGE DIAGNOSES: 1. Left leg edema. 2. Near syncopal episode. 3. History of deep vein thrombosis. 4. History of pulmonary embolus.  FINAL DISPOSITION: 1. The patient discharged to home. 2. Discharge medications:  The patient maintained same meds. 3. Asked to follow up in the office in two weeks . 4. We will set patient up with a vascular specialist to see if anything else    can be done for his chronic venous insufficiency.  INITIAL HISTORY AND PHYSICAL:  Please see H&P as dictated.  HOSPITAL COURSE:  This patient is a 60 year old white male with history of prior DVT and pulmonary embolus. He presented to the office the day of admission with progressive swelling in his legs, left more so than right. This worried him. In addition, the patient had a near syncopal episode. With all this developing, he was worried about the potential for a clot in his legs. Upon presentation, the patient was sent to the hospital. We could not get an ultrasound until the next day, so subcutaneous anticoagulation was provided with appropriate doses of Lovenox. Chest x-ray showed no significant changes. ABG was borderline normal. DVT was ruled out with a negative ultrasound the morning following admission and the patient was discharged home with diagnosis and disposition as noted above. Dictated by:   Donna Bernard, M.D. Attending Physician:  Harlow Asa DD:  06/04/01 TD:  06/05/01 Job: [id number]

## 2010-05-20 NOTE — Discharge Summary (Signed)
NAME:  Paul Baker, Paul Baker                      ACCOUNT NO.:  [id number]   MEDICAL RECORD NO.:  [id number]                   PATIENT TYPE:  INP   LOCATION:  0453                                 FACILITY:  [facility]   PHYSICIAN:  Madlyn Frankel. Charlann Boxer, M.D.               DATE OF BIRTH:  1950-03-02   DATE OF ADMISSION:  06/02/2003  DATE OF DISCHARGE:  06/06/2003                                 DISCHARGE SUMMARY   ADMISSION DIAGNOSES:  1. Infected left total knee.  2. Hypertension.  3. Coronary artery disease.  4. Noninsulin dependent diabetes mellitus.  5. History of pulmonary embolus and deep venous thrombosis.   DISCHARGE DIAGNOSES:  1. Infected left total knee, status post resection, left total knee     arthroplasty.  2. Hypertension.  3. Coronary artery disease.  4. Noninsulin dependent diabetes mellitus.  5. History of pulmonary embolus and deep venous thrombosis.  6. Postoperative hemorrhagic anemia, stable at the time of discharge.   PROCEDURE:  The patient was taken to the operating room on Jun 02, 2003 and  underwent a resection arthroplasty of a left total knee replacement with  antibiotic spacer placement which was Tobramycin.   SURGEON:  Madlyn Frankel. Charlann Boxer, M.D.   ASSISTANT:  Clarene Reamer, P.A.-C.   ANESTHESIA:  General.   DRAINS:  A Hemovac drain x1 was placed at the time of surgery.   CONSULTATIONS:  1. Physical therapy/occupational therapy.  2. Social work/case management.  3. [facility] for PICC line placement.  4. Infectious disease.   BRIEF HISTORY AND PHYSICAL:  The patient is a 60 year old gentleman who was  seen by Dr. Charlann Boxer for a second opinion from Dr. [name] regarding his left  knee.  He is status post multiple knee arthroscopies in the past and  subsequently was receiving care from Dr. [name].  He underwent primary  total knee replacement in July of 2003 and was apparently unremarkable.  Upon further review there was some incident at which point  during therapy he  was noted to have some drainage; however, on subsequent follow-up by Dr.  [name], the wounds had sealed up and there was no further problem.  Mr.  Frommelt continued to have some problems with recurrent effusions of his  left knee.  In January 2004 he had irrigation and debridement and was placed  on antibiotic.  He then had his knee aspirated in April 2004 which revealed  some Staph aureus which appeared to be MRSV.  He continues at this point to  have problems regarding his right knee.  Dr. Charlann Boxer felt at this point that  the best intervention would be the resection of the knee arthroplasty and IV  antibiotics for six weeks.  Risks and benefits of surgery were discussed  with the patient and the patient wished to proceed.   LABORATORY DATA:  CBC on admission showed a hemoglobin of 13.4, hematocrit  39, white blood cell count  6.8, red blood cell count 4.44.  Serial H&Hs were  followed throughout the hospital stay.  Hemoglobin and hematocrit did get  down to 8.8 and 25.1 on June 3; however, was back up to 10.9 and 31.8  respectively status post two units of packed red blood cells.  Differential  on admission showed myelocytes slightly high at 12.  Coagulation studies on  admission showed PT low at 10.8.  Serial PT/INR were taken throughout  hospital stay which at the time of discharge was 14.9 and 1.2 respectively  on Coumadin therapy.  Routine chemistry on admission showed glucose high at  109.  Serial chemistries were followed throughout the hospital stay.  Sodium  did climb to 130 on June 3 but was stable at the time of discharge.  Glucose  ranged from a low of 109 at the time of admission to a high of 165 on June  2.  Urinalysis on admission was all within normal limits.  The patient's  blood type was A positive with antibody screen negative.   I did not seen an EKG on this chart.  Radiographs of PICC line placement  reveal technically successful left arm PICC  placement with ultrasound and  fluoroscopic guidance.  [name]   HOSPITAL COURSE:  The patient was admitted to [facility] and taken  to the operating room.  He underwent the above stated procedure without  complications.  The patient tolerated the procedure well and returned to the  recovery room and orthopedic floor to continue postoperative care.  On  postoperative day #1 the patient was complaining of pain as expected.  Heart  rate was 110.  Hemoglobin and hematocrit were 10.1 and 29.2.  He was  neurovascularly intact to the left lower extremity.  Dressing was clean,  dry, and intact.  The patient was weight bearing as tolerated.  PICC line  was ordered at this time.  He is to have IV vancomycin for six weeks and he  is to continue working with physical therapy and occupational therapy.  By  June 2, postoperative day #2, the patient was feeling better.  Heart rate  was 108.  He remained neurovascularly intact.  Incision was clean, dry, and  intact.  The patient was to continue working with physical therapy and  occupational therapy.  Hemovac was discontinued on this day.  June 05, 2003:  Postoperative day #3, the patient was resting comfortably.  Hemoglobin and  hematocrit were 8.8 and 25.1, t-max of 99.  He was neurovascularly intact to  the left lower extremity.  Incision revealed some bloody drainage and serous  drainage with no signs or symptoms of infection.  The patient was to  continue working with physical therapy and occupational therapy.  He would  be transfused two units of packed red blood cells on this day.  June 06, 2003:  Postoperative day #4, the patient was doing well.  Pain was  tolerable.  Left knee had persistent small drainage at distal aspect which  was sanguineous and Steri-Strips were applied.  The patient was to be  discharged home on this day.   DISPOSITION:  The patient was discharged home on June 06, 2003.   DISCHARGE MEDICATIONS: 1. Vicodin 1-2 p.o.  q.4-6h. p.r.n. pain.  2. Robaxin 500 mg one p.o. q.6-8h. p.r.n. spasm.  3. Coumadin per pharmacy protocol.  4. Vancomycin 1 g IV q.12h. x6 weeks.   ACTIVITY:  The patient is weight bearing as tolerated to the left  lower  extremity.   DIET:  Previous diabetic diet at home.   WOUND CARE:  The patient is to perform daily dressing changes until no  drainage.  He may shower when no drainage.   FOLLOW UP:  The patient is to follow up with Dr. Charlann Boxer in 5-7 days.  He is to  call the office at [phone] for an appointment.   CONDITION ON DISCHARGE:  Stable and improved.     Clarene Reamer, P.A.-C.                   Madlyn Frankel Charlann Boxer, M.D.    SW/MEDQ  D:  07/22/2003  T:  07/22/2003  Job:  [id number]

## 2010-05-23 ENCOUNTER — Ambulatory Visit (HOSPITAL_COMMUNITY): Payer: Medicare Other

## 2010-05-25 ENCOUNTER — Ambulatory Visit (HOSPITAL_COMMUNITY): Payer: Medicare Other

## 2010-05-26 ENCOUNTER — Encounter: Payer: Self-pay | Admitting: Cardiology

## 2010-05-26 ENCOUNTER — Ambulatory Visit (INDEPENDENT_AMBULATORY_CARE_PROVIDER_SITE_OTHER): Payer: Medicare Other | Admitting: Cardiology

## 2010-05-26 VITALS — BP 124/71 | HR 73 | Ht 64.0 in | Wt 237.0 lb

## 2010-05-26 DIAGNOSIS — I2510 Atherosclerotic heart disease of native coronary artery without angina pectoris: Secondary | ICD-10-CM

## 2010-05-26 DIAGNOSIS — I10 Essential (primary) hypertension: Secondary | ICD-10-CM

## 2010-05-26 DIAGNOSIS — E782 Mixed hyperlipidemia: Secondary | ICD-10-CM

## 2010-05-26 NOTE — Assessment & Plan Note (Signed)
Relatively stable symptomatically, patient reports feeling better in general, still with intermittent angina symptoms. Plan to continue medical therapy and observation. Continue regular exercise regimen.

## 2010-05-26 NOTE — Assessment & Plan Note (Signed)
Blood pressure is well controlled today. No changes made to regimen.

## 2010-05-26 NOTE — Patient Instructions (Signed)
Your physician recommends that you continue on your current medications as directed. Please refer to the Current Medication list given to you today.  Your physician recommends that you return for lab work in: 6 months, just before next office visit.  Your physician recommends that you schedule a follow-up appointment in: 6 months   

## 2010-05-26 NOTE — Progress Notes (Signed)
Clinical Summary [name] is a 60 y.o.male presenting for followup. He was seen in February.  He is here with his wife. During completion of cardiac rehabilitation. He plans to continue exercising after this. States that he has enjoyed a program, and generally feels better with less shortness of breath and more energy. He does have intermittent episodes of angina, has used nitroglycerin on one occasion.  He reports compliance with his medications. Lab work from April 27 revealed total cholesterol 149, triglycerides121, HDL 42, LDL 83, AST 17, ALT 21.   Allergies  Allergen Reactions  . Codeine     Current outpatient prescriptions:aspirin 81 MG tablet, Take 81 mg by mouth daily.  , Disp: , Rfl: ;  Biotin 1000 MCG tablet, Take 1,000 mcg by mouth 3 (three) times daily.  , Disp: , Rfl: ;  cefPROZIL (CEFZIL) 500 MG tablet, Take 500 mg by mouth 2 (two) times daily. , Disp: , Rfl: ;  Cholecalciferol (VITAMIN D3) 1000 UNITS CAPS, Take 1 capsule by mouth daily.  , Disp: , Rfl:  Coenzyme Q10 (COQ10) 200 MG CAPS, Take 1 capsule by mouth daily.  , Disp: , Rfl: ;  glyBURIDE (DIABETA) 5 MG tablet, Take 5 mg by mouth daily. , Disp: , Rfl: ;  isosorbide mononitrate (IMDUR) 60 MG 24 hr tablet, Take 60 mg by mouth daily. Take 1/2 tab daily, Disp: , Rfl: ;  losartan (COZAAR) 100 MG tablet, Take 100 mg by mouth daily. , Disp: , Rfl:  metFORMIN (GLUCOPHAGE) 500 MG tablet, Take 500 mg by mouth 2 (two) times daily with a meal. 2 tabs am 1 tab lunch 2 tabs pm, Disp: , Rfl: ;  metoprolol (TOPROL-XL) 50 MG 24 hr tablet, Take 50 mg by mouth 2 (two) times daily. , Disp: , Rfl: ;  Multiple Vitamins-Minerals (MULTIVITAMIN WITH MINERALS) tablet, Take 1 tablet by mouth daily.  , Disp: , Rfl:  nitroGLYCERIN (NITROSTAT) 0.4 MG SL tablet, Place 0.4 mg under the tongue every 5 (five) minutes as needed.  , Disp: , Rfl: ;  Omega-3 Fatty Acids (FISH OIL) 1000 MG CAPS, Take 1 capsule by mouth daily.  , Disp: , Rfl: ;  rOPINIRole  (REQUIP) 4 MG tablet, Take 4 mg by mouth at bedtime. , Disp: , Rfl: ;  rosuvastatin (CRESTOR) 5 MG tablet, Take 1 tablet (5 mg total) by mouth daily., Disp: 42 tablet, Rfl: 0 testosterone cypionate (DEPOTESTOTERONE CYPIONATE) 100 MG/ML injection, Inject into the muscle every 14 (fourteen) days.  , Disp: , Rfl: ;  triamterene-hydrochlorothiazide (MAXZIDE-25) 37.5-25 MG per tablet, Take 1 tablet by mouth daily.  , Disp: , Rfl: ;  TRICOR 145 MG tablet, Take 145 mg by mouth daily. , Disp: , Rfl: ;  vitamin C (ASCORBIC ACID) 500 MG tablet, Take 500 mg by mouth daily.  , Disp: , Rfl:  DISCONTD: PENNSAID 1.5 % SOLN, , Disp: , Rfl:   Past Medical History  Diagnosis Date  . Arthritis   . Type 2 diabetes mellitus   . Mixed hyperlipidemia   . Essential hypertension, benign   . Septic arthritis of knee, left   . OSA (obstructive sleep apnea)     Sleep study 2007  . Coronary atherosclerosis of native coronary artery     Diseased nondominant RCA  . Restless leg syndrome   . Pulmonary embolism   . DVT (deep venous thrombosis)     Social History [name] reports that he has quit smoking. His smoking use included Cigarettes. He  has never used smokeless tobacco. [name] reports that he does not drink alcohol.  Review of Systems Continues to have chronic arthritic pain. Otherwise reviewed and negative.  Physical Examination Filed Vitals:   05/26/10 0916  BP: 124/71  Pulse: 73  Additional Exam: Obese male in no acute distress without active chest pain.  HEENT: Conjunctiva and lids normal, oropharynx with moist mucosa.  Neck: Supple, no elevated JVP or carotid bruits, no thyromegaly.  Lungs: Clear to auscultation, nonlabored, no wheezing.  Cardiac: Regular rate and rhythm, no S3 gallop or pericardial rub, no significant systolic murmur.  Abdomen: Soft, nontender, bowel sounds present.  Extremities: Trace to 1+ edema below the knees bilaterally, distal pulses one plus, somewhat stiff  digits and arthritic changes.  Musculoskeletal: No gross deformities. Left knee status post joint replacement with well-healed incision.  Skin: Warm and dry.  Neuropsychiatric: Alert and oriented x3, affect appropriate.    ECG Normal sinus rhythm with small R prime V1.   Problem List and Plan

## 2010-05-26 NOTE — Assessment & Plan Note (Signed)
Recent lipid numbers reviewed. Continue Crestor. Followup fasting lipid profile and liver function tests around the time of his next visit

## 2010-05-27 ENCOUNTER — Ambulatory Visit (HOSPITAL_COMMUNITY): Payer: Medicare Other

## 2010-05-30 ENCOUNTER — Ambulatory Visit (HOSPITAL_COMMUNITY): Payer: Medicare Other

## 2010-05-31 ENCOUNTER — Encounter: Payer: Self-pay | Admitting: Cardiology

## 2010-06-01 ENCOUNTER — Ambulatory Visit (HOSPITAL_COMMUNITY): Payer: Medicare Other

## 2010-06-03 ENCOUNTER — Ambulatory Visit (HOSPITAL_COMMUNITY): Payer: Medicare Other | Attending: Cardiology

## 2010-06-03 DIAGNOSIS — I2510 Atherosclerotic heart disease of native coronary artery without angina pectoris: Secondary | ICD-10-CM | POA: Insufficient documentation

## 2010-06-03 DIAGNOSIS — Z5189 Encounter for other specified aftercare: Secondary | ICD-10-CM | POA: Insufficient documentation

## 2010-06-06 ENCOUNTER — Ambulatory Visit (HOSPITAL_COMMUNITY): Payer: Medicare Other

## 2010-06-08 ENCOUNTER — Ambulatory Visit (HOSPITAL_COMMUNITY): Payer: Medicare Other

## 2010-06-10 ENCOUNTER — Ambulatory Visit (HOSPITAL_COMMUNITY): Payer: Medicare Other

## 2010-06-27 ENCOUNTER — Ambulatory Visit (HOSPITAL_COMMUNITY)
Admission: RE | Admit: 2010-06-27 | Discharge: 2010-06-27 | Disposition: A | Discharge status: Home / Self Care | Payer: Medicare Other | Source: Ambulatory Visit | Attending: Family Medicine | Admitting: Family Medicine

## 2010-06-27 ENCOUNTER — Other Ambulatory Visit: Payer: Self-pay | Admitting: Family Medicine

## 2010-06-27 DIAGNOSIS — M79673 Pain in unspecified foot: Secondary | ICD-10-CM

## 2010-06-27 DIAGNOSIS — M25579 Pain in unspecified ankle and joints of unspecified foot: Secondary | ICD-10-CM | POA: Insufficient documentation

## 2010-06-30 ENCOUNTER — Other Ambulatory Visit: Payer: Self-pay | Admitting: Cardiology

## 2010-06-30 NOTE — Telephone Encounter (Signed)
[place] patient.   

## 2010-07-12 ENCOUNTER — Other Ambulatory Visit: Payer: Self-pay | Admitting: Cardiology

## 2010-07-15 ENCOUNTER — Telehealth: Payer: Self-pay | Admitting: Cardiology

## 2010-07-15 ENCOUNTER — Encounter: Payer: Self-pay | Admitting: *Deleted

## 2010-07-15 NOTE — Telephone Encounter (Signed)
Patient needs letter stating that is okay to work out at [employer] / tg

## 2010-07-15 NOTE — Telephone Encounter (Signed)
Letter mailed to pt-THS

## 2010-11-18 ENCOUNTER — Encounter: Payer: Self-pay | Admitting: Cardiology

## 2010-11-23 ENCOUNTER — Telehealth: Payer: Self-pay | Admitting: Cardiology

## 2010-11-23 NOTE — Telephone Encounter (Signed)
PT NEEDS MORE CRESTOR 5MG  SAMPLES

## 2010-11-25 LAB — LIPID PANEL
Cholesterol: 137 mg/dL (ref 0–200)
HDL: 36 mg/dL — ABNORMAL LOW (ref >39)
Total CHOL/HDL Ratio: 3.8 Ratio
Triglycerides: 215 mg/dL — ABNORMAL HIGH (ref <150)
VLDL: 43 mg/dL — ABNORMAL HIGH (ref 0–40)

## 2010-11-25 LAB — HEPATIC FUNCTION PANEL
ALT: 21 U/L (ref 0–53)
Albumin: 4.7 g/dL (ref 3.5–5.2)
Indirect Bilirubin: 0.1 mg/dL (ref 0.0–0.9)
Total Protein: 7.2 g/dL (ref 6.0–8.3)

## 2010-11-30 ENCOUNTER — Encounter: Payer: Self-pay | Admitting: *Deleted

## 2010-11-30 ENCOUNTER — Ambulatory Visit (INDEPENDENT_AMBULATORY_CARE_PROVIDER_SITE_OTHER): Payer: Medicare Other | Admitting: Cardiology

## 2010-11-30 ENCOUNTER — Encounter: Payer: Self-pay | Admitting: Cardiology

## 2010-11-30 VITALS — BP 140/77 | HR 74 | Ht 65.0 in | Wt 236.0 lb

## 2010-11-30 DIAGNOSIS — I10 Essential (primary) hypertension: Secondary | ICD-10-CM

## 2010-11-30 DIAGNOSIS — E782 Mixed hyperlipidemia: Secondary | ICD-10-CM

## 2010-11-30 DIAGNOSIS — I2510 Atherosclerotic heart disease of native coronary artery without angina pectoris: Secondary | ICD-10-CM

## 2010-11-30 DIAGNOSIS — E119 Type 2 diabetes mellitus without complications: Secondary | ICD-10-CM

## 2010-11-30 NOTE — Assessment & Plan Note (Signed)
Plan to continue medical therapy for now, increasing Imdur to 30 mg BID. Otherwise diet and exercise. Followup arranged.

## 2010-11-30 NOTE — Progress Notes (Signed)
Clinical Summary Paul Baker is a 60 y.o.male presenting for followup. He was seen in May. He reports intermittent angina usually relieved with one nitroglycerin, rarely needs two.  He states that he has been walking for exercise. We discussed his diet.  Recent lab work showed cholesterol 137, triglycerides 215, HDL 36. LDL 58. LFT's normal. These were reviewed today. He reports compliance with his medications.   Allergies  Allergen Reactions  . Codeine     Medication list reviewed.  Past Medical History  Diagnosis Date  . Arthritis   . Type 2 diabetes mellitus   . Mixed hyperlipidemia   . Essential hypertension, benign   . Septic arthritis of knee, left   . OSA (obstructive sleep apnea)     Sleep study 2007  . Coronary atherosclerosis of native coronary artery     Diseased nondominant RCA  . Restless leg syndrome   . Pulmonary embolism   . DVT (deep venous thrombosis)     Past Surgical History  Procedure Date  . Total knee arthroplasty   . Lumbar disc surgery     Left L3, L4, L5 discecotomy with decompression of L4 root  . Wrist surgery   . Total knee revision     Social History Paul Baker reports that he has quit smoking. His smoking use included Cigarettes. He has never used smokeless tobacco. Paul Baker reports that he does not drink alcohol.  Review of Systems No palpitations, orthopnea, PND. Otherwise negative.  Physical Examination Filed Vitals:   11/30/10 1430  BP: 140/77  Pulse: 74    Obese male in no acute distress without active chest pain.  HEENT: Conjunctiva and lids normal, oropharynx with moist mucosa.  Neck: Supple, no elevated JVP or carotid bruits, no thyromegaly.  Lungs: Clear to auscultation, nonlabored, no wheezing.  Cardiac: Regular rate and rhythm, no S3 gallop or pericardial rub, no significant systolic murmur.  Abdomen: Soft, nontender, bowel sounds present.  Extremities: Trace to 1+ edema below the knees bilaterally, distal  pulses one plus, somewhat stiff digits and arthritic changes.  Musculoskeletal: No gross deformities. Left knee status post joint replacement with well-healed incision.  Skin: Warm and dry.  Neuropsychiatric: Alert and oriented x3, affect appropriate.   ECG Normal sinus rhythm.   Problem List and Plan

## 2010-11-30 NOTE — Patient Instructions (Signed)
Your physician recommends that you schedule a follow-up appointment in: 6 months   Your physician has recommended you make the following change in your medication:  START taking Imdur 60 mg 1/2 tablet twice daily  Your physician recommends that you return for lab work in: 5 months (you will receive a letter)

## 2010-11-30 NOTE — Assessment & Plan Note (Signed)
Followed by Dr. Luking 

## 2010-11-30 NOTE — Assessment & Plan Note (Signed)
No changes to current regimen

## 2010-11-30 NOTE — Assessment & Plan Note (Signed)
Continue to work on diet and weight loss. 

## 2010-12-01 ENCOUNTER — Encounter: Payer: Self-pay | Admitting: Cardiology

## 2011-01-11 ENCOUNTER — Other Ambulatory Visit: Payer: Self-pay | Admitting: Cardiology

## 2011-02-20 ENCOUNTER — Other Ambulatory Visit: Payer: Self-pay | Admitting: Cardiology

## 2011-03-14 ENCOUNTER — Telehealth: Payer: Self-pay | Admitting: Cardiology

## 2011-03-14 MED ORDER — ROSUVASTATIN CALCIUM 5 MG PO TABS: 5.0000 mg | ORAL_TABLET | Freq: Every day | ORAL | Status: DC

## 2011-03-14 NOTE — Telephone Encounter (Signed)
rx sent into pharmacy. lmom for pt

## 2011-03-14 NOTE — Telephone Encounter (Signed)
PT NEEDS SAMPLE OF CRESTOR 5MG  OR NEEDS RX CALLED IN TO [facility]

## 2011-05-03 ENCOUNTER — Encounter: Payer: Self-pay | Admitting: Cardiology

## 2011-05-30 ENCOUNTER — Ambulatory Visit (INDEPENDENT_AMBULATORY_CARE_PROVIDER_SITE_OTHER): Payer: BLUE CROSS/BLUE SHIELD | Admitting: Cardiology

## 2011-05-30 ENCOUNTER — Encounter: Payer: Self-pay | Admitting: Cardiology

## 2011-05-30 VITALS — BP 153/84 | HR 78 | Ht 65.0 in | Wt 244.0 lb

## 2011-05-30 DIAGNOSIS — I10 Essential (primary) hypertension: Secondary | ICD-10-CM

## 2011-05-30 DIAGNOSIS — E785 Hyperlipidemia, unspecified: Secondary | ICD-10-CM

## 2011-05-30 DIAGNOSIS — I2510 Atherosclerotic heart disease of native coronary artery without angina pectoris: Secondary | ICD-10-CM

## 2011-05-30 MED ORDER — AMLODIPINE BESYLATE 2.5 MG PO TABS: 2.5000 mg | ORAL_TABLET | Freq: Every day | ORAL | Status: DC

## 2011-05-30 NOTE — Patient Instructions (Signed)
**Note De-Identified Paul Baker Obfuscation** Your physician has recommended you make the following change in your medication: start taking Norvasc 2.5 mg daily  Your physician recommends that you schedule a follow-up appointment in: 6 months

## 2011-05-30 NOTE — Progress Notes (Signed)
Clinical Summary Paul Baker is a 61 y.o.male presenting for followup. He was seen in November 2012. He reports intermittent chest pain symptoms, usually sudden onset and at rest rather than with exertion. He does report responsiveness to nitroglycerin however. He states that the symptoms have been progressive over the last few months. He had an episode earlier today, ECG is reviewed below.  He reports compliance with his medications. We increased his nitrates at the last visit.  History includes significant ostial disease of a small nondominant RCA that was managed medically. He had no obstructive LAD or circumflex lesions.  He states that he has still been exercising 3 days a week at the gym. Feels short of breath but no predictable exertional chest pain symptoms.   Allergies  Allergen Reactions  . Codeine     Current Outpatient Prescriptions  Medication Sig Dispense Refill  . amLODipine (NORVASC) 2.5 MG tablet Take 1 tablet (2.5 mg total) by mouth daily.  30 tablet  6  . aspirin 81 MG tablet Take 81 mg by mouth daily.        . Coenzyme Q10 (COQ10) 200 MG CAPS Take 1 capsule by mouth daily.       [name] glyBURIDE (DIABETA) 5 MG tablet Take 20 mg by mouth daily.       . isosorbide mononitrate (IMDUR) 60 MG 24 hr tablet Take 0.5 tablets (30 mg total) by mouth 2 (two) times daily.  60 tablet  6  . losartan (COZAAR) 100 MG tablet Take 100 mg by mouth daily.       . metFORMIN (GLUCOPHAGE) 500 MG tablet Take 500 mg by mouth 2 (two) times daily with a meal. 2 tabs am 1 tab lunch 2 tabs pm      . metoprolol (TOPROL-XL) 50 MG 24 hr tablet TAKE (1) TABLET BY MOUTH TWICE DAILY.  60 tablet  5  . Multiple Vitamins-Minerals (MULTIVITAMIN WITH MINERALS) tablet Take 1 tablet by mouth daily.        . nitroGLYCERIN (NITROSTAT) 0.4 MG SL tablet Place 0.4 mg under the tongue every 5 (five) minutes as needed.        . Omega-3 Fatty Acids (FISH OIL) 1000 MG CAPS Take 1 capsule by mouth daily.        [name]  rOPINIRole (REQUIP) 4 MG tablet Take 4 mg by mouth at bedtime.       . rosuvastatin (CRESTOR) 5 MG tablet Take 1 tablet (5 mg total) by mouth daily.  30 tablet  6  . triamterene-hydrochlorothiazide (MAXZIDE-25) 37.5-25 MG per tablet Take 1 tablet by mouth daily.        . TRICOR 145 MG tablet Take 145 mg by mouth daily.         Past Medical History  Diagnosis Date  . Arthritis   . Type 2 diabetes mellitus   . Mixed hyperlipidemia   . Essential hypertension, benign   . Septic arthritis of knee, left   . OSA (obstructive sleep apnea)     Sleep study 2007  . Coronary atherosclerosis of native coronary artery     Diseased nondominant RCA  . Restless leg syndrome   . Pulmonary embolism   . DVT (deep venous thrombosis)     Social History Paul Baker reports that he has quit smoking. His smoking use included Cigarettes. He has never used smokeless tobacco. Paul Baker reports that he does not drink alcohol.  Review of Systems No sense of palpitations. No syncope. No reported  bleeding problems.  Physical Examination Filed Vitals:   05/30/11 1459  BP: 153/84  Pulse: 78    Obese male in no acute distress without active chest pain.  HEENT: Conjunctiva and lids normal, oropharynx with moist mucosa.  Neck: Supple, no elevated JVP or carotid bruits, no thyromegaly.  Lungs: Clear to auscultation, nonlabored, no wheezing.  Cardiac: Regular rate and rhythm, no S3 gallop or pericardial rub, no significant systolic murmur.  Abdomen: Soft, nontender, bowel sounds present.  Extremities: Trace to 1+ edema below the knees bilaterally, distal pulses one plus, somewhat stiff digits and arthritic changes.  Musculoskeletal: No gross deformities. Left knee status post joint replacement with well-healed incision.    ECG Sinus rhythm with decreased R wave progression. No acute ST changes.  Problem List and Plan   CORONARY ATHEROSCLEROSIS NATIVE CORONARY ARTERY As noted above. He is having  recurrent chest pains, although not clear that all of these represent angina however. He does have response to nitrates. Plan to add Norvasc 2.5 mg daily as an additional antianginal. Will also have his prior cardiac catheterization films reviewed to see if his RCA which such that an intervention could ever be considered, or whether this would be only addressed medically going forward.  ESSENTIAL HYPERTENSION, BENIGN Addition of Norvasc will also likely be of benefit in blood pressure control.  HYPERLIPIDEMIA Followed by Dr. [name].     Jonelle Sidle, M.D., F.A.C.C.

## 2011-05-30 NOTE — Assessment & Plan Note (Signed)
Followed by Dr. Luking 

## 2011-05-30 NOTE — Assessment & Plan Note (Signed)
Addition of Norvasc will also likely be of benefit in blood pressure control.

## 2011-05-30 NOTE — Assessment & Plan Note (Signed)
As noted above. He is having recurrent chest pains, although not clear that all of these represent angina however. He does have response to nitrates. Plan to add Norvasc 2.5 mg daily as an additional antianginal. Will also have his prior cardiac catheterization films reviewed to see if his RCA which such that an intervention could ever be considered, or whether this would be only addressed medically going forward.

## 2011-06-13 ENCOUNTER — Telehealth: Payer: Self-pay | Admitting: Cardiology

## 2011-06-13 NOTE — Telephone Encounter (Signed)
PT STATES AT LAST VISIT HE WAS TOLD DR MCDOWELL WAS GOING TO CHECK WITH SOME SURGEONS TO SEE IF THERE WAS ANYTHING ELSE THEY COULD DO FOR HIM. HE WAS UNSURE IF WE WERE GOING TO CALL AND LET HIM KNOW OR IF THEY WOULD TALK ABOUT IT AT NEXT VISIT.

## 2011-06-13 NOTE — Telephone Encounter (Signed)
**Note De-Identified Paul Baker Obfuscation** Last OV note reviewed. Dr. Diona Browner noted that he will review prior cardiac catheterization to see if his RCA is such that an intervention could ever be considered, or whether this would be only addressed medically going forward. Please advise./LV

## 2011-06-13 NOTE — Telephone Encounter (Signed)
**Note De-identified Paul Baker Obfuscation** Pt. advised, he verbalized understanding./LV 

## 2011-06-13 NOTE — Telephone Encounter (Signed)
Yes, the films were reviewed. I discussed this with Dr. Excell Seltzer. The RCA is quite small, and not amenable to intervention. It seems unlikely that this vessel should be leading to significant angina symptoms, and therefore medical therapy remains most appropriate at this time.

## 2011-07-19 ENCOUNTER — Other Ambulatory Visit: Payer: Self-pay | Admitting: Adult Health

## 2011-07-20 ENCOUNTER — Other Ambulatory Visit: Payer: Self-pay | Admitting: Family Medicine

## 2011-07-20 ENCOUNTER — Ambulatory Visit (HOSPITAL_COMMUNITY)
Admission: RE | Admit: 2011-07-20 | Discharge: 2011-07-20 | Disposition: A | Discharge status: Home / Self Care | Payer: Medicare Other | Source: Ambulatory Visit | Attending: Family Medicine | Admitting: Family Medicine

## 2011-07-20 DIAGNOSIS — J40 Bronchitis, not specified as acute or chronic: Secondary | ICD-10-CM

## 2011-07-20 DIAGNOSIS — R059 Cough, unspecified: Secondary | ICD-10-CM | POA: Insufficient documentation

## 2011-07-20 DIAGNOSIS — R05 Cough: Secondary | ICD-10-CM | POA: Insufficient documentation

## 2011-09-07 ENCOUNTER — Other Ambulatory Visit: Payer: Self-pay | Admitting: Cardiology

## 2011-10-03 ENCOUNTER — Other Ambulatory Visit: Payer: Self-pay

## 2011-10-03 DIAGNOSIS — J45909 Unspecified asthma, uncomplicated: Secondary | ICD-10-CM

## 2011-10-10 ENCOUNTER — Ambulatory Visit (HOSPITAL_COMMUNITY)
Admission: RE | Admit: 2011-10-10 | Discharge: 2011-10-10 | Disposition: A | Discharge status: Home / Self Care | Payer: Medicare Other | Source: Ambulatory Visit | Attending: Family Medicine | Admitting: Family Medicine

## 2011-10-10 DIAGNOSIS — J45909 Unspecified asthma, uncomplicated: Secondary | ICD-10-CM | POA: Insufficient documentation

## 2011-10-10 MED ORDER — ALBUTEROL SULFATE (5 MG/ML) 0.5% IN NEBU
2.5000 mg | INHALATION_SOLUTION | Freq: Once | RESPIRATORY_TRACT | Status: AC
  Administered 2011-10-10: 2.5 mg via RESPIRATORY_TRACT

## 2011-10-10 NOTE — Procedures (Signed)
NAMECHIJIOKE, LASSER            ACCOUNT NO.:  [id number]  MEDICAL RECORD NO.:  [id number]  LOCATION:  RESP                          FACILITY:  [facility]  PHYSICIAN:  Guilherme Schwenke L. Juanetta Gosling, M.D.DATE OF BIRTH:  1950/12/29  DATE OF PROCEDURE: DATE OF DISCHARGE:                           PULMONARY FUNCTION TEST   Reason for pulmonary function testing is reactive airway disease. 1. Spirometry shows minimal ventilatory defect without airflow     obstruction. 2. There is no post bronchodilator improvement.     Willford Rabideau L. Juanetta Gosling, M.D.     ELH/MEDQ  D:  10/10/2011  T:  10/10/2011  Job:  [id number]

## 2011-10-17 ENCOUNTER — Other Ambulatory Visit: Payer: Self-pay | Admitting: Cardiology

## 2011-10-17 LAB — PULMONARY FUNCTION TEST

## 2011-10-18 ENCOUNTER — Telehealth: Payer: Self-pay | Admitting: Cardiology

## 2011-10-18 NOTE — Telephone Encounter (Signed)
He may want to check price at different pharmacies as they do tend to vary. He could also price Lipitor 10 mg in generic form as well, and we could make a switch if he would like. Would also offer him Crestor samples.

## 2011-10-18 NOTE — Telephone Encounter (Signed)
Patient states he will continue as long as samples are available.

## 2011-10-18 NOTE — Telephone Encounter (Signed)
Please advise 

## 2011-10-18 NOTE — Telephone Encounter (Signed)
Attempted to contact patient. No answer at this time. 

## 2011-10-18 NOTE — Telephone Encounter (Signed)
Pt wants to know if he can take a cheaper drug other then crestor? Pt is aware we have sample coming in and was advised to check back with [place] on Friday.

## 2011-10-25 ENCOUNTER — Telehealth: Payer: Self-pay | Admitting: Cardiology

## 2011-10-25 NOTE — Telephone Encounter (Signed)
Patient needs samples of 5mg  crestor.  Please call let patient know if available.

## 2011-10-25 NOTE — Telephone Encounter (Signed)
Samples available.  Advised patient he could pick up this afternoon.

## 2011-11-29 ENCOUNTER — Ambulatory Visit (INDEPENDENT_AMBULATORY_CARE_PROVIDER_SITE_OTHER): Payer: Medicare Other | Admitting: Cardiology

## 2011-11-29 ENCOUNTER — Encounter: Payer: Self-pay | Admitting: Cardiology

## 2011-11-29 VITALS — BP 138/70 | HR 81 | Ht 65.0 in | Wt 239.0 lb

## 2011-11-29 DIAGNOSIS — E119 Type 2 diabetes mellitus without complications: Secondary | ICD-10-CM

## 2011-11-29 DIAGNOSIS — I2510 Atherosclerotic heart disease of native coronary artery without angina pectoris: Secondary | ICD-10-CM

## 2011-11-29 DIAGNOSIS — I10 Essential (primary) hypertension: Secondary | ICD-10-CM

## 2011-11-29 DIAGNOSIS — E785 Hyperlipidemia, unspecified: Secondary | ICD-10-CM

## 2011-11-29 MED ORDER — AMLODIPINE BESYLATE 5 MG PO TABS: 5.0000 mg | ORAL_TABLET | Freq: Every day | ORAL | Status: DC

## 2011-11-29 NOTE — Assessment & Plan Note (Signed)
Keep followup with Dr. Luking. 

## 2011-11-29 NOTE — Patient Instructions (Addendum)
Your physician recommends that you schedule a follow-up appointment in: 6 MONTHS WITH SM  Your physician has recommended you make the following change in your medication:   1) INCREASE NORVASC to 5MG  DAILY

## 2011-11-29 NOTE — Assessment & Plan Note (Signed)
Continue present regimen, Norvasc is also being increased.

## 2011-11-29 NOTE — Progress Notes (Signed)
Clinical Summary Paul Baker is a 61 y.o.male presenting for followup. He was seen in May. At that time low dose Norvasc was added for additional antianginal benefit, and his prior cardiac catheterization films were reviewed with Dr. Excell Seltzer. Patient's RCA is quite small and diffusely diseased, not amenable to intervention.  He continues to have regular chest pain symptoms, usually responsive to nitroglycerin but not consistently. Also has some left arm symptoms, but reports problems with his rotator cuff on that side.  He does feel like the addition of Norvasc was beneficial in terms of anti-anginal treatment. He has tolerated this.  ECG today shows normal sinus rhythm, no acute ST segment changes.   Allergies  Allergen Reactions  . Codeine     Current Outpatient Prescriptions  Medication Sig Dispense Refill  . aspirin 81 MG tablet Take 81 mg by mouth 2 (two) times daily.       . CRESTOR 5 MG tablet TAKE (1) TABLET BY MOUTH DAILY.  30 tablet  11  . fenofibrate 160 MG tablet Take 160 mg by mouth daily.      [name] glyBURIDE (DIABETA) 5 MG tablet Take 10 mg by mouth 2 (two) times daily.       . isosorbide mononitrate (IMDUR) 60 MG 24 hr tablet Take 0.5 tablets (30 mg total) by mouth 2 (two) times daily.  60 tablet  6  . LANTUS SOLOSTAR 100 UNIT/ML injection Inject 10 Units into the skin at bedtime and may repeat dose one time if needed.       [name] losartan (COZAAR) 100 MG tablet Take 100 mg by mouth daily.       . metFORMIN (GLUCOPHAGE) 500 MG tablet Take 500 mg by mouth 2 (two) times daily with a meal. 2 tabs am 1 tab lunch 2 tabs pm      . Multiple Vitamins-Minerals (MULTIVITAMIN WITH MINERALS) tablet Take 1 tablet by mouth daily.        [name] NITROQUICK 0.4 MG SL tablet DISSOLVE 1 TABLET UNDER TONGUE EVERY 5 MINUTES UP TO 15 MIN FOR CHESTPAIN. IF NO RELIEF CALL 911.  25 each  3  . Omega-3 Fatty Acids (FISH OIL) 1000 MG CAPS Take 1 capsule by mouth daily.        [name] rOPINIRole (REQUIP) 4 MG  tablet Take 4 mg by mouth at bedtime.       . TOPROL XL 50 MG 24 hr tablet TAKE (1) TABLET BY MOUTH TWICE DAILY.  60 each  6  . triamterene-hydrochlorothiazide (MAXZIDE-25) 37.5-25 MG per tablet Take 1 tablet by mouth daily.        . [DISCONTINUED] amLODipine (NORVASC) 2.5 MG tablet Take 1 tablet (2.5 mg total) by mouth daily.  30 tablet  6  . amLODipine (NORVASC) 5 MG tablet Take 1 tablet (5 mg total) by mouth daily.  30 tablet  6    Past Medical History  Diagnosis Date  . Arthritis   . Type 2 diabetes mellitus   . Mixed hyperlipidemia   . Essential hypertension, benign   . Septic arthritis of knee, left   . OSA (obstructive sleep apnea)     Sleep study 2007  . Coronary atherosclerosis of native coronary artery     Diseased nondominant RCA  . Restless leg syndrome   . Pulmonary embolism   . DVT (deep venous thrombosis)     Social History Mr. Campagna reports that he has quit smoking. His smoking use included Cigarettes. He has never used  smokeless tobacco. Mr. Fragoso reports that he does not drink alcohol.  Review of Systems No palpitations, no reported bleeding episodes. States that he has had difficulty with blood sugar control. Otherwise negative.  Physical Examination Filed Vitals:   11/29/11 0828  BP: 138/70  Pulse: 81   Filed Weights   11/29/11 0828  Weight: 239 lb (108.41 kg)    Obese male in no acute distress without active chest pain.  HEENT: Conjunctiva and lids normal, oropharynx with moist mucosa.  Neck: Supple, no elevated JVP or carotid bruits, no thyromegaly.  Lungs: Clear to auscultation, nonlabored, no wheezing.  Cardiac: Regular rate and rhythm, no S3 gallop or pericardial rub, no significant systolic murmur.  Abdomen: Soft, nontender, bowel sounds present.  Extremities: Trace to 1+ edema below the knees bilaterally, distal pulses one plus.  Musculoskeletal: No gross deformities.    Problem List and Plan   CORONARY ATHEROSCLEROSIS NATIVE  CORONARY ARTERY Continue medical therapy and observation for now. PCI to his small nondominant diseased RCA is not an optimal option, films reviewed with Dr. Excell Seltzer since last visit. Norvasc will be increased to 5 mg daily. Followup arranged.  ESSENTIAL HYPERTENSION, BENIGN Continue present regimen, Norvasc is also being increased.  HYPERLIPIDEMIA Due for followup lab work with PaulLuking this week.  DM Keep followup with Paul Baker.    Paul Baker, M.D., F.A.C.C.

## 2011-11-29 NOTE — Assessment & Plan Note (Signed)
Due for followup lab work with Dr.Luking this week.

## 2011-11-29 NOTE — Assessment & Plan Note (Signed)
Continue medical therapy and observation for now. PCI to his small nondominant diseased RCA is not an optimal option, films reviewed with Dr. Excell Seltzer since last visit. Norvasc will be increased to 5 mg daily. Followup arranged.

## 2011-12-22 ENCOUNTER — Other Ambulatory Visit: Payer: Self-pay | Admitting: Cardiology

## 2011-12-22 MED ORDER — ROSUVASTATIN CALCIUM 5 MG PO TABS: 5.0000 mg | ORAL_TABLET | Freq: Every day | ORAL | Status: DC

## 2011-12-22 NOTE — Telephone Encounter (Signed)
rx sent to pharmacy by e-script  

## 2011-12-22 NOTE — Telephone Encounter (Signed)
PT NEEDS GENERIC CRESTOR CALLED IN TO [facility] HE IS OUT TODAY/TMJ

## 2012-02-07 ENCOUNTER — Other Ambulatory Visit: Payer: Self-pay | Admitting: Adult Health

## 2012-02-07 NOTE — Telephone Encounter (Signed)
rx sent to pharmacy by e-script  

## 2012-03-11 ENCOUNTER — Other Ambulatory Visit: Payer: Self-pay | Admitting: Cardiology

## 2012-03-18 ENCOUNTER — Ambulatory Visit (HOSPITAL_COMMUNITY)
Admission: RE | Admit: 2012-03-18 | Discharge: 2012-03-18 | Disposition: A | Discharge status: Home / Self Care | Payer: Medicare Other | Source: Ambulatory Visit | Attending: Family Medicine | Admitting: Family Medicine

## 2012-03-18 ENCOUNTER — Other Ambulatory Visit: Payer: Self-pay | Admitting: Family Medicine

## 2012-03-18 DIAGNOSIS — R059 Cough, unspecified: Secondary | ICD-10-CM | POA: Insufficient documentation

## 2012-03-18 DIAGNOSIS — R079 Chest pain, unspecified: Secondary | ICD-10-CM | POA: Insufficient documentation

## 2012-03-20 ENCOUNTER — Other Ambulatory Visit: Payer: Self-pay | Admitting: *Deleted

## 2012-03-20 MED ORDER — GLYBURIDE 5 MG PO TABS: 10.0000 mg | ORAL_TABLET | Freq: Two times a day (BID) | ORAL | Status: DC

## 2012-04-02 ENCOUNTER — Institutional Professional Consult (permissible substitution): Payer: Self-pay | Admitting: Neurology

## 2012-04-19 ENCOUNTER — Telehealth: Payer: Self-pay | Admitting: Cardiology

## 2012-04-19 ENCOUNTER — Encounter (HOSPITAL_COMMUNITY): Payer: Self-pay | Admitting: *Deleted

## 2012-04-19 ENCOUNTER — Emergency Department (HOSPITAL_COMMUNITY): Payer: Medicare Other

## 2012-04-19 ENCOUNTER — Observation Stay (HOSPITAL_COMMUNITY): Payer: Medicare Other

## 2012-04-19 ENCOUNTER — Inpatient Hospital Stay (HOSPITAL_COMMUNITY)
Admission: EM | Admit: 2012-04-19 | Discharge: 2012-04-21 | DRG: 303 | Disposition: A | Discharge status: Home / Self Care | Payer: Medicare Other | Attending: Internal Medicine | Admitting: Internal Medicine

## 2012-04-19 DIAGNOSIS — R531 Weakness: Secondary | ICD-10-CM

## 2012-04-19 DIAGNOSIS — Z86711 Personal history of pulmonary embolism: Secondary | ICD-10-CM

## 2012-04-19 DIAGNOSIS — E785 Hyperlipidemia, unspecified: Secondary | ICD-10-CM

## 2012-04-19 DIAGNOSIS — E871 Hypo-osmolality and hyponatremia: Secondary | ICD-10-CM | POA: Diagnosis present

## 2012-04-19 DIAGNOSIS — I25119 Atherosclerotic heart disease of native coronary artery with unspecified angina pectoris: Secondary | ICD-10-CM | POA: Diagnosis present

## 2012-04-19 DIAGNOSIS — I2510 Atherosclerotic heart disease of native coronary artery without angina pectoris: Secondary | ICD-10-CM | POA: Diagnosis present

## 2012-04-19 DIAGNOSIS — E1165 Type 2 diabetes mellitus with hyperglycemia: Secondary | ICD-10-CM | POA: Diagnosis present

## 2012-04-19 DIAGNOSIS — Z87891 Personal history of nicotine dependence: Secondary | ICD-10-CM

## 2012-04-19 DIAGNOSIS — G4733 Obstructive sleep apnea (adult) (pediatric): Secondary | ICD-10-CM | POA: Diagnosis present

## 2012-04-19 DIAGNOSIS — Z794 Long term (current) use of insulin: Secondary | ICD-10-CM

## 2012-04-19 DIAGNOSIS — E782 Mixed hyperlipidemia: Secondary | ICD-10-CM | POA: Diagnosis present

## 2012-04-19 DIAGNOSIS — I208 Other forms of angina pectoris: Secondary | ICD-10-CM

## 2012-04-19 DIAGNOSIS — R29898 Other symptoms and signs involving the musculoskeletal system: Secondary | ICD-10-CM | POA: Diagnosis present

## 2012-04-19 DIAGNOSIS — Z7982 Long term (current) use of aspirin: Secondary | ICD-10-CM

## 2012-04-19 DIAGNOSIS — E119 Type 2 diabetes mellitus without complications: Secondary | ICD-10-CM

## 2012-04-19 DIAGNOSIS — I10 Essential (primary) hypertension: Secondary | ICD-10-CM | POA: Diagnosis present

## 2012-04-19 DIAGNOSIS — Z96659 Presence of unspecified artificial knee joint: Secondary | ICD-10-CM

## 2012-04-19 DIAGNOSIS — R42 Dizziness and giddiness: Secondary | ICD-10-CM

## 2012-04-19 DIAGNOSIS — R079 Chest pain, unspecified: Secondary | ICD-10-CM | POA: Diagnosis present

## 2012-04-19 DIAGNOSIS — Z9989 Dependence on other enabling machines and devices: Secondary | ICD-10-CM | POA: Diagnosis present

## 2012-04-19 DIAGNOSIS — J209 Acute bronchitis, unspecified: Secondary | ICD-10-CM | POA: Diagnosis present

## 2012-04-19 DIAGNOSIS — G2581 Restless legs syndrome: Secondary | ICD-10-CM | POA: Diagnosis present

## 2012-04-19 DIAGNOSIS — I209 Angina pectoris, unspecified: Secondary | ICD-10-CM | POA: Diagnosis present

## 2012-04-19 DIAGNOSIS — Z86718 Personal history of other venous thrombosis and embolism: Secondary | ICD-10-CM

## 2012-04-19 DIAGNOSIS — Z79899 Other long term (current) drug therapy: Secondary | ICD-10-CM

## 2012-04-19 HISTORY — DX: Unspecified disorder of synovium and tendon, unspecified shoulder: M67.919

## 2012-04-19 HISTORY — DX: Unspecified asthma, uncomplicated: J45.909

## 2012-04-19 LAB — DIFFERENTIAL
Eosinophils Absolute: 0.2 10*3/uL (ref 0.0–0.7)
Eosinophils Relative: 4 % (ref 0–5)
Lymphocytes Relative: 26 % (ref 12–46)
Lymphs Abs: 1.4 10*3/uL (ref 0.7–4.0)
Monocytes Absolute: 1.1 10*3/uL — ABNORMAL HIGH (ref 0.1–1.0)
Monocytes Relative: 20 % — ABNORMAL HIGH (ref 3–12)

## 2012-04-19 LAB — BASIC METABOLIC PANEL
BUN: 21 mg/dL (ref 6–23)
Chloride: 95 mEq/L — ABNORMAL LOW (ref 96–112)
Creatinine, Ser: 1.17 mg/dL (ref 0.50–1.35)
GFR calc Af Amer: 77 mL/min — ABNORMAL LOW (ref >90)

## 2012-04-19 LAB — CBC
HCT: 36.8 % — ABNORMAL LOW (ref 39.0–52.0)
Hemoglobin: 12.5 g/dL — ABNORMAL LOW (ref 13.0–17.0)
MCH: 29.3 pg (ref 26.0–34.0)
MCV: 86.4 fL (ref 78.0–100.0)
RBC: 4.26 MIL/uL (ref 4.22–5.81)
WBC: 5.5 10*3/uL (ref 4.0–10.5)

## 2012-04-19 LAB — HEPATIC FUNCTION PANEL
AST: 23 U/L (ref 0–37)
Albumin: 4 g/dL (ref 3.5–5.2)
Total Protein: 7.4 g/dL (ref 6.0–8.3)

## 2012-04-19 LAB — GLUCOSE, CAPILLARY: Glucose-Capillary: 270 mg/dL — ABNORMAL HIGH (ref 70–99)

## 2012-04-19 LAB — PROTIME-INR: Prothrombin Time: 12 seconds (ref 11.6–15.2)

## 2012-04-19 MED ORDER — AMLODIPINE BESYLATE 5 MG PO TABS
5.0000 mg | ORAL_TABLET | Freq: Every day | ORAL | Status: DC
  Administered 2012-04-20 – 2012-04-21 (×2): 5 mg via ORAL
  Filled 2012-04-19 (×2): qty 1

## 2012-04-19 MED ORDER — NITROGLYCERIN 0.4 MG SL SUBL: 0.4000 mg | SUBLINGUAL_TABLET | SUBLINGUAL | Status: DC | PRN

## 2012-04-19 MED ORDER — HYDROCODONE-ACETAMINOPHEN 5-325 MG PO TABS
1.0000 | ORAL_TABLET | ORAL | Status: DC | PRN
  Administered 2012-04-19: 1 via ORAL
  Filled 2012-04-19: qty 1

## 2012-04-19 MED ORDER — ASPIRIN 81 MG PO TABS
81.0000 mg | ORAL_TABLET | Freq: Two times a day (BID) | ORAL | Status: DC
  Filled 2012-04-19: qty 1

## 2012-04-19 MED ORDER — ATORVASTATIN CALCIUM 40 MG PO TABS
40.0000 mg | ORAL_TABLET | Freq: Every day | ORAL | Status: DC
  Administered 2012-04-19 – 2012-04-20 (×2): 40 mg via ORAL
  Filled 2012-04-19 (×3): qty 1

## 2012-04-19 MED ORDER — SODIUM CHLORIDE 0.9 % IV SOLN
INTRAVENOUS | Status: DC
  Administered 2012-04-20 (×2): via INTRAVENOUS

## 2012-04-19 MED ORDER — MECLIZINE HCL 12.5 MG PO TABS
25.0000 mg | ORAL_TABLET | Freq: Once | ORAL | Status: AC
  Administered 2012-04-19: 25 mg via ORAL
  Filled 2012-04-19: qty 2

## 2012-04-19 MED ORDER — HEPARIN SODIUM (PORCINE) 5000 UNIT/ML IJ SOLN
5000.0000 [IU] | Freq: Three times a day (TID) | INTRAMUSCULAR | Status: DC
  Administered 2012-04-19 – 2012-04-21 (×5): 5000 [IU] via SUBCUTANEOUS
  Filled 2012-04-19 (×8): qty 1

## 2012-04-19 MED ORDER — LOSARTAN POTASSIUM 50 MG PO TABS
100.0000 mg | ORAL_TABLET | Freq: Every day | ORAL | Status: DC
  Administered 2012-04-20 – 2012-04-21 (×2): 100 mg via ORAL
  Filled 2012-04-19 (×2): qty 2

## 2012-04-19 MED ORDER — INSULIN GLARGINE 100 UNIT/ML ~~LOC~~ SOLN
40.0000 [IU] | Freq: Every day | SUBCUTANEOUS | Status: DC
  Administered 2012-04-19: 40 [IU] via SUBCUTANEOUS
  Filled 2012-04-19 (×2): qty 0.4

## 2012-04-19 MED ORDER — FENOFIBRATE 160 MG PO TABS
160.0000 mg | ORAL_TABLET | Freq: Every day | ORAL | Status: DC
  Administered 2012-04-20 – 2012-04-21 (×2): 160 mg via ORAL
  Filled 2012-04-19 (×2): qty 1

## 2012-04-19 MED ORDER — IPRATROPIUM BROMIDE 0.02 % IN SOLN
0.5000 mg | Freq: Once | RESPIRATORY_TRACT | Status: AC
  Administered 2012-04-19: 0.5 mg via RESPIRATORY_TRACT
  Filled 2012-04-19: qty 2.5

## 2012-04-19 MED ORDER — SODIUM CHLORIDE 0.9 % IJ SOLN
3.0000 mL | Freq: Two times a day (BID) | INTRAMUSCULAR | Status: DC
  Administered 2012-04-20 (×2): 3 mL via INTRAVENOUS

## 2012-04-19 MED ORDER — GUAIFENESIN ER 600 MG PO TB12
1200.0000 mg | ORAL_TABLET | Freq: Two times a day (BID) | ORAL | Status: DC
  Administered 2012-04-19 – 2012-04-21 (×4): 1200 mg via ORAL
  Filled 2012-04-19 (×5): qty 2

## 2012-04-19 MED ORDER — METOPROLOL SUCCINATE ER 50 MG PO TB24
50.0000 mg | ORAL_TABLET | Freq: Every day | ORAL | Status: DC
  Administered 2012-04-20 – 2012-04-21 (×2): 50 mg via ORAL
  Filled 2012-04-19 (×2): qty 1

## 2012-04-19 MED ORDER — IPRATROPIUM BROMIDE 0.02 % IN SOLN
0.5000 mg | Freq: Four times a day (QID) | RESPIRATORY_TRACT | Status: DC
  Administered 2012-04-19: 0.5 mg via RESPIRATORY_TRACT
  Filled 2012-04-19: qty 2.5

## 2012-04-19 MED ORDER — ASPIRIN 81 MG PO CHEW
81.0000 mg | CHEWABLE_TABLET | Freq: Two times a day (BID) | ORAL | Status: DC
  Administered 2012-04-19 – 2012-04-21 (×4): 81 mg via ORAL
  Filled 2012-04-19 (×4): qty 1

## 2012-04-19 MED ORDER — INSULIN ASPART 100 UNIT/ML ~~LOC~~ SOLN
0.0000 [IU] | Freq: Three times a day (TID) | SUBCUTANEOUS | Status: DC
  Administered 2012-04-20 (×2): 8 [IU] via SUBCUTANEOUS
  Administered 2012-04-20: 5 [IU] via SUBCUTANEOUS
  Administered 2012-04-21: 3 [IU] via SUBCUTANEOUS

## 2012-04-19 MED ORDER — MORPHINE SULFATE 2 MG/ML IJ SOLN: 2.0000 mg | INTRAMUSCULAR | Status: DC | PRN

## 2012-04-19 MED ORDER — LEVOFLOXACIN IN D5W 750 MG/150ML IV SOLN
750.0000 mg | INTRAVENOUS | Status: DC
  Administered 2012-04-19: 750 mg via INTRAVENOUS
  Filled 2012-04-19 (×2): qty 150

## 2012-04-19 MED ORDER — MORPHINE SULFATE 2 MG/ML IJ SOLN
INTRAMUSCULAR | Status: AC
  Administered 2012-04-19: 2 mg via INTRAVENOUS
  Filled 2012-04-19: qty 1

## 2012-04-19 MED ORDER — ISOSORBIDE MONONITRATE ER 30 MG PO TB24
30.0000 mg | ORAL_TABLET | Freq: Every day | ORAL | Status: DC
  Administered 2012-04-20 – 2012-04-21 (×2): 30 mg via ORAL
  Filled 2012-04-19 (×2): qty 1

## 2012-04-19 MED ORDER — RANOLAZINE ER 500 MG PO TB12
500.0000 mg | ORAL_TABLET | Freq: Two times a day (BID) | ORAL | Status: DC
  Administered 2012-04-19 – 2012-04-21 (×4): 500 mg via ORAL
  Filled 2012-04-19 (×6): qty 1

## 2012-04-19 MED ORDER — NITROGLYCERIN 0.4 MG SL SUBL
0.4000 mg | SUBLINGUAL_TABLET | SUBLINGUAL | Status: DC | PRN
  Administered 2012-04-19: 0.4 mg via SUBLINGUAL
  Filled 2012-04-19: qty 25

## 2012-04-19 MED ORDER — ROPINIROLE HCL 1 MG PO TABS
4.0000 mg | ORAL_TABLET | Freq: Every day | ORAL | Status: DC
  Administered 2012-04-19 – 2012-04-20 (×2): 4 mg via ORAL
  Filled 2012-04-19 (×3): qty 4

## 2012-04-19 MED ORDER — ACETAMINOPHEN 325 MG PO TABS
650.0000 mg | ORAL_TABLET | Freq: Four times a day (QID) | ORAL | Status: DC | PRN
  Administered 2012-04-20 – 2012-04-21 (×3): 650 mg via ORAL
  Filled 2012-04-19 (×3): qty 2

## 2012-04-19 MED ORDER — ACETAMINOPHEN 650 MG RE SUPP: 650.0000 mg | Freq: Four times a day (QID) | RECTAL | Status: DC | PRN

## 2012-04-19 MED ORDER — ALBUTEROL SULFATE (5 MG/ML) 0.5% IN NEBU
5.0000 mg | INHALATION_SOLUTION | Freq: Once | RESPIRATORY_TRACT | Status: AC
  Administered 2012-04-19: 5 mg via RESPIRATORY_TRACT
  Filled 2012-04-19: qty 1

## 2012-04-19 MED ORDER — TRIAMTERENE-HCTZ 37.5-25 MG PO TABS
1.0000 | ORAL_TABLET | Freq: Every day | ORAL | Status: DC
  Administered 2012-04-20 – 2012-04-21 (×2): 1 via ORAL
  Filled 2012-04-19 (×2): qty 1

## 2012-04-19 MED ORDER — ASPIRIN 81 MG PO CHEW
324.0000 mg | CHEWABLE_TABLET | Freq: Once | ORAL | Status: AC
  Administered 2012-04-19: 324 mg via ORAL
  Filled 2012-04-19: qty 4

## 2012-04-19 MED ORDER — GUAIFENESIN-DM 100-10 MG/5ML PO SYRP: 5.0000 mL | ORAL_SOLUTION | ORAL | Status: DC | PRN

## 2012-04-19 NOTE — ED Notes (Signed)
Stroke team arrival

## 2012-04-19 NOTE — ED Provider Notes (Signed)
History     CSN: [id number]  Arrival date & time 04/19/12  1035   First MD Initiated Contact with Patient 04/19/12 1047      Chief Complaint  Patient presents with  . Shortness of Breath  . Cough  . Dizziness    (Consider location/radiation/quality/duration/timing/severity/associated sxs/prior treatment) HPI Pt transferred from [facility] for acute onset right sided weakness starting around 1200 today. Pt states he woke and was unsteady with central chest pressure. Seen in ED adn given ASA, NTG, and meclizine. Then developed R side weakness and tremor. Pt states that CP is resolved though now has headache. Report CAD by cath in 2010 unable to place stent.  Past Medical History  Diagnosis Date  . Arthritis   . Type 2 diabetes mellitus   . Mixed hyperlipidemia   . Essential hypertension, benign   . Septic arthritis of knee, left   . OSA (obstructive sleep apnea)     Sleep study 2007  . Coronary atherosclerosis of native coronary artery     Diseased nondominant RCA  . Restless leg syndrome   . Pulmonary embolism 2004  . DVT (deep venous thrombosis)   . Asthma   . Rotator cuff disorder     Past Surgical History  Procedure Laterality Date  . Total knee arthroplasty    . Lumbar disc surgery      Left L3, L4, L5 discecotomy with decompression of L4 root  . Wrist surgery    . Total knee revision      History reviewed. No pertinent family history.  History  Substance Use Topics  . Smoking status: Former Smoker    Types: Cigarettes  . Smokeless tobacco: Never Used  . Alcohol Use: No      Review of Systems  Constitutional: Negative for fever and chills.  HENT: Negative for neck pain and neck stiffness.   Respiratory: Positive for cough and shortness of breath. Negative for wheezing.   Cardiovascular: Positive for chest pain. Negative for palpitations and leg swelling.  Gastrointestinal: Negative for nausea, vomiting and abdominal pain.  Musculoskeletal: Negative  for back pain.  Skin: Negative for rash and wound.  Neurological: Positive for dizziness, tremors, weakness, light-headedness and headaches. Negative for numbness.  All other systems reviewed and are negative.    Allergies  Codeine  Home Medications   Current Outpatient Rx  Name  Route  Sig  Dispense  Refill  . amLODipine (NORVASC) 5 MG tablet   Oral   Take 1 tablet (5 mg total) by mouth daily.   30 tablet   6   . aspirin 81 MG tablet   Oral   Take 81 mg by mouth 2 (two) times daily.          . fenofibrate 160 MG tablet   Oral   Take 160 mg by mouth daily.         [name] glyBURIDE (DIABETA) 5 MG tablet   Oral   Take 2 tablets (10 mg total) by mouth 2 (two) times daily.   120 tablet   2   . isosorbide mononitrate (IMDUR) 60 MG 24 hr tablet      TAKE (1/2) TABLET BY MOUTH TWICE DAILY.   60 tablet   3   . LANTUS SOLOSTAR 100 UNIT/ML injection   Subcutaneous   Inject 10 Units into the skin at bedtime and may repeat dose one time if needed.          [name] losartan (COZAAR) 100 MG  tablet   Oral   Take 100 mg by mouth daily.          . metFORMIN (GLUCOPHAGE) 500 MG tablet   Oral   Take 500 mg by mouth 2 (two) times daily with a meal. 2 tabs am 1 tab lunch 2 tabs pm         . metoprolol succinate (TOPROL-XL) 50 MG 24 hr tablet      TAKE (1) TABLET BY MOUTH TWICE DAILY.   60 tablet   4   . Multiple Vitamins-Minerals (MULTIVITAMIN WITH MINERALS) tablet   Oral   Take 1 tablet by mouth daily.           [name] NITROQUICK 0.4 MG SL tablet      DISSOLVE 1 TABLET UNDER TONGUE EVERY 5 MINUTES UP TO 15 MIN FOR CHESTPAIN. IF NO RELIEF CALL 911.   25 each   3   . Omega-3 Fatty Acids (FISH OIL) 1000 MG CAPS   Oral   Take 1 capsule by mouth daily.           [name] rOPINIRole (REQUIP) 4 MG tablet   Oral   Take 4 mg by mouth at bedtime.          . rosuvastatin (CRESTOR) 5 MG tablet   Oral   Take 1 tablet (5 mg total) by mouth daily.   30 tablet   5   .  triamterene-hydrochlorothiazide (MAXZIDE-25) 37.5-25 MG per tablet   Oral   Take 1 tablet by mouth daily.             BP 129/72  Pulse 88  Temp(Src) 97.6 F (36.4 C) (Oral)  Resp 18  Ht 5\' 5"  (1.651 m)  Wt 231 lb (104.781 kg)  BMI 38.44 kg/m2  SpO2 99%  Physical Exam  Nursing note and vitals reviewed. Constitutional: He is oriented to person, place, and time. He appears well-developed and well-nourished. No distress.  HENT:  Head: Normocephalic and atraumatic.  Mouth/Throat: Oropharynx is clear and moist.  Eyes: EOM are normal. Pupils are equal, round, and reactive to light.  Neck: Normal range of motion. Neck supple.  Cardiovascular: Normal rate and regular rhythm.   Pulmonary/Chest: Effort normal and breath sounds normal. No respiratory distress. He has no wheezes. He has no rales. He exhibits no tenderness.  Abdominal: Soft. Bowel sounds are normal. He exhibits no distension and no mass. There is no tenderness. There is no rebound and no guarding.  Musculoskeletal: Normal range of motion. He exhibits no edema and no tenderness.  Neurological: He is alert and oriented to person, place, and time.  Tremor to RUE/RLE. 4/5 motor RUE/RLE. Sensation intact. Finger to nose intact, CN II-XII intact  Skin: Skin is warm and dry. No rash noted. No erythema.  Psychiatric: He has a normal mood and affect. His behavior is normal.    ED Course  Procedures (including critical care time)  Labs Reviewed  BASIC METABOLIC PANEL - Abnormal; Notable for the following:    Sodium 132 (*)    Chloride 95 (*)    Glucose, Bld 317 (*)    GFR calc non Af Amer 66 (*)    GFR calc Af Amer 77 (*)    All other components within normal limits  HEPATIC FUNCTION PANEL - Abnormal; Notable for the following:    Total Bilirubin 0.2 (*)    Indirect Bilirubin 0.1 (*)    All other components within normal limits  CBC - Abnormal; Notable  for the following:    Hemoglobin 12.5 (*)    HCT 36.8 (*)    All  other components within normal limits  DIFFERENTIAL - Abnormal; Notable for the following:    Monocytes Relative 20 (*)    Monocytes Absolute 1.1 (*)    All other components within normal limits  URINE CULTURE  TROPONIN I  PRO B NATRIURETIC PEPTIDE  ETHANOL  PROTIME-INR  APTT  CBC WITH DIFFERENTIAL  URINE RAPID DRUG SCREEN (HOSP PERFORMED)  URINALYSIS, ROUTINE W REFLEX MICROSCOPIC   Dg Chest 2 View  04/19/2012  *RADIOLOGY REPORT*  Clinical Data: Rule out infiltrate.  CHF.  CHEST - 2 VIEW  Comparison: 03/18/2012  Findings: The heart size and mediastinal contours are within normal limits.  Both lungs are clear. Spondylosis is noted in the thoracic spine.  IMPRESSION: No acute cardiopulmonary abnormalities.   Original Report Authenticated By: Signa Kell, M.D.    Ct Head Wo Contrast  04/19/2012  *RADIOLOGY REPORT*  Clinical Data: Right-sided weakness  CT HEAD WITHOUT CONTRAST  Technique:  Contiguous axial images were obtained from the base of the skull through the vertex without contrast.  Comparison: None.  Findings: The brain has a normal appearance without evidence for hemorrhage, infarction, hydrocephalus, or mass lesion.  There is no extra axial fluid collection.  The skull and paranasal sinuses are normal.  IMPRESSION: Normal exam.   Original Report Authenticated By: Signa Kell, M.D.      1. Acute right-sided weakness   2. Chest pain      Date: 04/19/2012  Rate:86  Rhythm: normal sinus rhythm  QRS Axis: normal  Intervals: normal  ST/T Wave abnormalities: normal  Conduction Disutrbances:none  Narrative Interpretation:   Old EKG Reviewed: unchanged    MDM   Dr Amada Jupiter at bedside. Does not believe pt is tPA candidate. Recommends MRI brain. Discussed admission with [facility] for Cp rule out and neuro findings.        Loren Racer, MD 04/19/12 [phone]

## 2012-04-19 NOTE — Telephone Encounter (Signed)
Called pt to clarify sxs, noted pt is currently being evaluated in the [facility], left message to advise this nurse notes he is in the ED and wanted to advise we were calling to update, call office with any further assistance if needed

## 2012-04-19 NOTE — Progress Notes (Signed)
Placed pt. On CPAP per MD order via nasal mask, auto titrate settings (min 6.0- max 16.0) Pt. Tolerating well at this time.

## 2012-04-19 NOTE — ED Notes (Addendum)
Patient did not have pronator drift present at 1155. When reassessed at 1203, patient did have a right pronator drift. Right sided weakness present and pt reports left sided headache.

## 2012-04-19 NOTE — ED Notes (Signed)
Code stroke canceled by Dr. Petra Kuba, Neurologist

## 2012-04-19 NOTE — Consult Note (Signed)
CARDIOLOGY CONSULT NOTE  Patient ID: Paul Baker MRN: [id number] DOB/AGE: Jul 24, 1950 62 y.o.  Admit date: 04/19/2012 Primary Physician: Dr. [name] Primary Cardiologist: Dr. Diona Browner  Reason for Consultation: Chest pain  HPI: 62 yo with history of CAD, HTN, DM, VTE, and back/knee surgery presented to the ER at [facility] today with difficulty with balance.  In terms of CAD, he has known diffuse severe disease in a nondominant RCA (no PCI options) and has been medically treated for chronic angina for a number of years.    He has had a cough with wheezing/chest tightness and congestion for 2-3 days.  No fever/chills.  This morning, his daughter noted that he was "shaky" and having difficulty with balance.  Blood glucose was 421.  He was still coughing and wheezing.  Family took him to the ER at [facility]  While there, he was noted to still be off balance with some right-sided weakness.  He did not seem to have vertigo per se, and was not lightheaded.  He was just off balance.  Head CT was negative.  He was sent to [facility] and code stroke was initially called but was called off because his exam by the neurologist was nonfocal.  He has been started on antibiotics for acute bronchitis (no PNA on CXR).   Of note, patient has chronic chest pain.  His pain has been more frequent for the last 4-5 months.  He will get a sharp chest pain after walking about 150 feet (to the mailbox) or going up a flight of steps.  This occurs daily.  The pain has been occurring more often for several months but I do not think it is acutely worse this admission.  He has only had chest tightness here associated with wheezing.  ECG is normal, cardiac enzymes negative.   Review of systems complete and found to be negative unless listed above in HPI  Past Medical History: 1. CAD: Has history of diffuse disease in a small, nondominant RCA.  No PCI options.  He has had chronic anginal CP and is medically managed.    2. HTN 3. H/o DVT/PE in 2004 4. Type II diabetes 5. Hyperlipidemia 6. OSA 7. Asthma 8. Back surgery  9. H/o TKR  Prior to Admission medications   Medication  Sig  Start Date  End Date  Taking?  Authorizing Provider   amLODipine (NORVASC) 5 MG tablet  Take 1 tablet (5 mg total) by mouth daily.  11/29/11    Jonelle Sidle, MD   aspirin 81 MG tablet  Take 81 mg by mouth 2 (two) times daily.     Historical Provider, MD   fenofibrate 160 MG tablet  Take 160 mg by mouth daily.     Historical Provider, MD   glyBURIDE (DIABETA) 5 MG tablet  Take 2 tablets (10 mg total) by mouth 2 (two) times daily.  03/20/12    Merlyn Albert, MD   isosorbide mononitrate (IMDUR) 60 MG 24 hr tablet  TAKE (1/2) TABLET BY MOUTH TWICE DAILY.  03/11/12    Jonelle Sidle, MD   LANTUS SOLOSTAR 100 UNIT/ML injection  Inject 10 Units into the skin at bedtime and may repeat dose one time if needed.  11/21/11    Historical Provider, MD   losartan (COZAAR) 100 MG tablet  Take 100 mg by mouth daily.  04/18/10    Historical Provider, MD   metFORMIN (GLUCOPHAGE) 500 MG tablet  Take 500 mg  by mouth 2 (two) times daily with a meal. 2 tabs am 1 tab lunch 2 tabs pm  04/12/10    Historical Provider, MD   metoprolol succinate (TOPROL-XL) 50 MG 24 hr tablet  TAKE (1) TABLET BY MOUTH TWICE DAILY.  02/07/12    Jodelle Gross, NP   Multiple Vitamins-Minerals (MULTIVITAMIN WITH MINERALS) tablet  Take 1 tablet by mouth daily.     Historical Provider, MD   NITROQUICK 0.4 MG SL tablet  DISSOLVE 1 TABLET UNDER TONGUE EVERY 5 MINUTES UP TO 15 MIN FOR CHESTPAIN. IF NO RELIEF CALL 911.  09/07/11    Jonelle Sidle, MD   Omega-3 Fatty Acids (FISH OIL) 1000 MG CAPS  Take 1 capsule by mouth daily.     Historical Provider, MD   rOPINIRole (REQUIP) 4 MG tablet  Take 4 mg by mouth at bedtime.  04/18/10    Historical Provider, MD   rosuvastatin (CRESTOR) 5 MG tablet  Take 1 tablet (5 mg total) by mouth daily.  12/22/11    Jonelle Sidle, MD    triamterene-hydrochlorothiazide (MAXZIDE-25) 37.5-25 MG per tablet  Take 1 tablet by mouth daily.          History reviewed. No pertinent family history.  History   Social History  . Marital Status: Married    Spouse Name: N/A    Number of Children: N/A  . Years of Education: N/A   Occupational History  . Disabled   .     Social History Main Topics  . Smoking status: Former Smoker    Types: Cigarettes  . Smokeless tobacco: Never Used  . Alcohol Use: No  . Drug Use: No  . Sexually Active: Not on file   Other Topics Concern  . Not on file   Social History Narrative  . No narrative on file    Physical exam Blood pressure 132/70, pulse 93, temperature 97.6 F (36.4 C), temperature source Oral, resp. rate 19, height 5\' 5"  (1.651 m), weight 231 lb (104.781 kg), SpO2 100.00%. General: NAD, obese Neck: Thick, no JVD, no thyromegaly or thyroid nodule.  Lungs: Clear to auscultation bilaterally with normal respiratory effort. CV: Nondisplaced PMI.  Heart regular S1/S2, no S3/S4, no murmur.  No peripheral edema.  No carotid bruit.  Normal pedal pulses.  Abdomen: Soft, nontender, no hepatosplenomegaly, no distention.  Skin: Intact without lesions or rashes.  Neurologic: Alert and oriented x 3.  Psych: Normal affect. Extremities: No clubbing or cyanosis.  HEENT: Normal.   Labs:   Lab Results  Component Value Date   WBC 5.5 04/19/2012   HGB 12.5* 04/19/2012   HCT 36.8* 04/19/2012   MCV 86.4 04/19/2012   PLT 288 04/19/2012    Recent Labs Lab 04/19/12 1142 04/19/12 1205  NA 132*  --   K 4.1  --   CL 95*  --   CO2 23  --   BUN 21  --   CREATININE 1.17  --   CALCIUM 9.8  --   PROT  --  7.4  BILITOT  --  0.2*  ALKPHOS  --  46  ALT  --  30  AST  --  23  GLUCOSE 317*  --    Lab Results  Component Value Date   TROPONINI <0.30 04/19/2012  BNP 13    Radiology: - CXR: No acute abnormalities  EKG: NSR, normal  ASSESSMENT AND PLAN: 62 yo with history of CAD, HTN,  DM, VTE, and back/knee surgery  presented to the ER at [facility] today with difficulty with balance and cough/wheezing.  In terms of CAD, he has known diffuse severe disease in a nondominant RCA (no PCI options) and has been medically treated for chronic angina for a number of years.   1. Acute bronchitis: Wheezing, cough.  To start antibiotic per primary team.  2. Disequilibrium: Ongoing workup.  He will get a head MRI.  His exam currently is nonfocal.   3. Chest pain: He has chronic angina with known diffuse disease of a nondominant RCA (not amenable to PCI).  Angina has been worse x 4-5 months but not acutely this admission.  Cardiac enzymes negative so far and ECG is normal.  - Continue ASA, statin, amlodipine, Imdur, metoprolol.  - I will add ranolazine 500 mg bid with ECG in am to assess QT interval.  - Would plan for outpatient stress test if he remains stable here.   Marca Ancona 04/19/2012 4:29 PM

## 2012-04-19 NOTE — ED Notes (Signed)
Pt. Arrived via [facility] as a code-stroke, airway patent. Rt. Arm drift reported by paramedics.  [facility] team and Dr.Yelverton at the [facility] to meet pt.

## 2012-04-19 NOTE — ED Notes (Signed)
Report given to [facility]. [facility]

## 2012-04-19 NOTE — ED Notes (Signed)
Patient w/history of unstable angina, presents w/increasing cough, SOB, intermittent L chest pain radiating to L arm and jaw.  Has 97% blockage of posterior cardiac artery.  SOB worse when lying down.  Cough has been progressively worsening since Christmas.

## 2012-04-19 NOTE — ED Notes (Signed)
CBG 270 

## 2012-04-19 NOTE — ED Provider Notes (Signed)
History     CSN: [id number]  Arrival date & time 04/19/12  1035   First MD Initiated Contact with Patient 04/19/12 1047      Chief Complaint  Patient presents with  . Shortness of Breath  . Cough  . Dizziness    HPI Pt was seen at 1055.  Per pt and his family, c/o gradual onset and persistence of multiple complaints for the past several days to months.  Pt c/o non-productive cough with occasional "wheezing" for the past 4 months.  Pt also c/o intermittent left sided chest "pain" that has increased over the past 2 days.  Pt describes this as "pressure" that radiates into his left arm and "squeezes." CP lasts approx 20 minutes and is relieved by him taking his SL ntg.  Pt describes these symptoms as per his usual anginal pain pattern, but that they have been coming on "more frequently" over the past 2 days.  Pt also c/o gradual onset and persistence of multiple intermittent episodes of "dizziness" for the past several days, worse since this morning after he woke up.  Pt states he feels "off balance" when he tries to walk and feels like "everything is moving."  These symptoms worsen when he moves his head or eyes side-to-side or changes his body position. States he has had these symptoms previously but can't recall the dx.  Denies slurred speech, no visual changes, no focal motor weakness, no tingling/numbness in extremities, no facial droop, no head or neck pain, no palpitations, no abd pain, no N/V/D, no back pain.     Past Medical History  Diagnosis Date  . Arthritis   . Type 2 diabetes mellitus   . Mixed hyperlipidemia   . Essential hypertension, benign   . Septic arthritis of knee, left   . OSA (obstructive sleep apnea)     Sleep study 2007  . Coronary atherosclerosis of native coronary artery     Diseased nondominant RCA  . Restless leg syndrome   . Pulmonary embolism 2004  . DVT (deep venous thrombosis)   . Asthma   . Rotator cuff disorder     Past Surgical History   Procedure Laterality Date  . Total knee arthroplasty    . Lumbar disc surgery      Left L3, L4, L5 discecotomy with decompression of L4 root  . Wrist surgery    . Total knee revision       History  Substance Use Topics  . Smoking status: Former Smoker    Types: Cigarettes  . Smokeless tobacco: Never Used  . Alcohol Use: No      Review of Systems ROS: Statement: All systems negative except as marked or noted in the HPI; Constitutional: Negative for fever and chills. ; ; Eyes: Negative for eye pain, redness and discharge. ; ; ENMT: Negative for ear pain, hoarseness, nasal congestion, sinus pressure and sore throat. ; ; Cardiovascular: +CP, SOB. Negative for palpitations, diaphoresis, and peripheral edema. ; ; Respiratory: +cough. Negative for wheezing and stridor. ; ; Gastrointestinal: Negative for nausea, vomiting, diarrhea, abdominal pain, blood in stool, hematemesis, jaundice and rectal bleeding. . ; ; Genitourinary: Negative for dysuria, flank pain and hematuria. ; ; Musculoskeletal: Negative for back pain and neck pain. Negative for swelling and trauma.; ; Skin: Negative for pruritus, rash, abrasions, blisters, bruising and skin lesion.; ; Neuro: +"dizziness." Negative for headache, lightheadedness and neck stiffness. Negative for weakness, altered level of consciousness , altered mental status, extremity weakness, paresthesias, involuntary  movement, seizure and syncope.       Allergies  Codeine  Home Medications   Current Outpatient Rx  Name  Route  Sig  Dispense  Refill  . amLODipine (NORVASC) 5 MG tablet   Oral   Take 1 tablet (5 mg total) by mouth daily.   30 tablet   6   . aspirin 81 MG tablet   Oral   Take 81 mg by mouth 2 (two) times daily.          . fenofibrate 160 MG tablet   Oral   Take 160 mg by mouth daily.         [name] glyBURIDE (DIABETA) 5 MG tablet   Oral   Take 2 tablets (10 mg total) by mouth 2 (two) times daily.   120 tablet   2   .  isosorbide mononitrate (IMDUR) 60 MG 24 hr tablet      TAKE (1/2) TABLET BY MOUTH TWICE DAILY.   60 tablet   3   . LANTUS SOLOSTAR 100 UNIT/ML injection   Subcutaneous   Inject 10 Units into the skin at bedtime and may repeat dose one time if needed.          [name] losartan (COZAAR) 100 MG tablet   Oral   Take 100 mg by mouth daily.          . metFORMIN (GLUCOPHAGE) 500 MG tablet   Oral   Take 500 mg by mouth 2 (two) times daily with a meal. 2 tabs am 1 tab lunch 2 tabs pm         . metoprolol succinate (TOPROL-XL) 50 MG 24 hr tablet      TAKE (1) TABLET BY MOUTH TWICE DAILY.   60 tablet   4   . Multiple Vitamins-Minerals (MULTIVITAMIN WITH MINERALS) tablet   Oral   Take 1 tablet by mouth daily.           [name] NITROQUICK 0.4 MG SL tablet      DISSOLVE 1 TABLET UNDER TONGUE EVERY 5 MINUTES UP TO 15 MIN FOR CHESTPAIN. IF NO RELIEF CALL 911.   25 each   3   . Omega-3 Fatty Acids (FISH OIL) 1000 MG CAPS   Oral   Take 1 capsule by mouth daily.           [name] rOPINIRole (REQUIP) 4 MG tablet   Oral   Take 4 mg by mouth at bedtime.          . rosuvastatin (CRESTOR) 5 MG tablet   Oral   Take 1 tablet (5 mg total) by mouth daily.   30 tablet   5   . triamterene-hydrochlorothiazide (MAXZIDE-25) 37.5-25 MG per tablet   Oral   Take 1 tablet by mouth daily.             BP 142/76  Pulse 92  Temp(Src) 97.8 F (36.6 C) (Oral)  Resp 17  Ht 5\' 5"  (1.651 m)  Wt 231 lb (104.781 kg)  BMI 38.44 kg/m2  SpO2 98%  Physical Exam 1100: Physical examination:  Nursing notes reviewed; Vital signs and O2 SAT reviewed;  Constitutional: Well developed, Well nourished, Well hydrated, In no acute distress; Head:  Normocephalic, atraumatic; Eyes: EOMI, PERRL, No scleral icterus; ENMT: TM's clear bilat. +edemetous nasal turbinates bilat with clear rhinorrhea.  Mouth and pharynx normal, Mucous membranes moist; Neck: Supple, Full range of motion, No lymphadenopathy; Cardiovascular:  Regular rate and rhythm, No gallop; Respiratory: Breath  sounds coarse & equal bilaterally, exp wheezes bilat. Speaking full sentences with ease, Normal respiratory effort/excursion; Chest: Nontender, Movement normal; Abdomen: Soft, Nontender, Nondistended, Normal bowel sounds; Genitourinary: No CVA tenderness; Extremities: Pulses normal, No tenderness, No edema, No calf edema or asymmetry.; Neuro: AA&Ox3, Major CN grossly intact.  Strength 5/5 equal bilat UE's and LE's.  DTR 2/4 equal bilat UE's and LE's.  No gross sensory deficits.  Normal cerebellar testing bilat UE's (finger-nose) and LE's (heel-shin).  No pronator drift.  Speech clear.  No facial droop. +left horizontal end gaze fatigable nystagmus which reproduces pt's "dizziness".; Skin: Color normal, Warm, Dry.   ED Course  Procedures   1105:  Pt currently is c/o chest pain "just a little bit" now, and also is having cough with wheezing.  Pt with known CAD, EKG unchanged from previous.  Will tx ASA, ntg for CP, as well as neb for wheezing.  Will tx antivert for "dizziness," which clinically appears to be vertigo, as neuro exam is intact.  Pt without aortic disease on previous CT's of thorax and abd/pelvis; doubt dissection.    1205:  ED RN came to me and stated that her assessment of the pt at 1200 had changed from just 5 minutes previously: states pt now has right pronator drift, as well as RUE and RLE weakness.  Pt re-examined:  Strength 4/5 RUE and RLE, +drift RUE and RLE, +shaking when performing heel-shin/finger-nose but no ataxia. Pt's speech remains clear and there is no facial droop.  Code Stroke called.   1210:  T/C to [facility] Neurologist Dr. Amada Jupiter, case discussed, including:  HPI, pertinent PM/SHx, VS/PE, dx testing, ED course and treatment:  made aware CT unavailable today at [facility], agrees that pt needs CT-H before is able to be considered for TPA, requests to transfer to [facility] ED for further eval. Dx and testing d/w pt and family.   Questions answered.  Verb understanding, agreeable to transfer to [facility] ED.     MDM  MDM Reviewed: previous chart, nursing note and vitals Reviewed previous: ECG and labs Interpretation: ECG, labs and x-ray Total time providing critical care: 30-74 minutes. This excludes time spent performing separately reportable procedures and services. Consults: neurology   CRITICAL CARE Performed by: Laray Anger Total critical care time: 35 Critical care time was exclusive of separately billable procedures and treating other patients. Critical care was necessary to treat or prevent imminent or life-threatening deterioration. Critical care was time spent personally by me on the following activities: development of treatment plan with patient and/or surrogate as well as nursing, discussions with consultants, evaluation of patient's response to treatment, examination of patient, obtaining history from patient or surrogate, ordering and performing treatments and interventions, ordering and review of laboratory studies, ordering and review of radiographic studies, pulse oximetry and re-evaluation of patient's condition.    Date: 04/19/2012  Rate: 86  Rhythm: normal sinus rhythm  QRS Axis: left  Intervals: normal  ST/T Wave abnormalities: normal  Conduction Disutrbances:none  Narrative Interpretation:   Old EKG Reviewed: unchanged; no significant changes from previous EKG dated 07/16/2008.   Results for orders placed during the hospital encounter of 04/19/12  BASIC METABOLIC PANEL      Result Value Range   Sodium 132 (*) 135 - 145 mEq/L   Potassium 4.1  3.5 - 5.1 mEq/L   Chloride 95 (*) 96 - 112 mEq/L   CO2 23  19 - 32 mEq/L   Glucose, Bld 317 (*) 70 - 99 mg/dL  BUN 21  6 - 23 mg/dL   Creatinine, Ser 4.78  0.50 - 1.35 mg/dL   Calcium 9.8  8.4 - 29.5 mg/dL   GFR calc non Af Amer 66 (*) >90 mL/min   GFR calc Af Amer 77 (*) >90 mL/min  TROPONIN I      Result Value Range   Troponin I  <0.30  <0.30 ng/mL  PRO B NATRIURETIC PEPTIDE      Result Value Range   Pro B Natriuretic peptide (BNP) 13.3  0 - 125 pg/mL  CBC      Result Value Range   WBC 5.5  4.0 - 10.5 K/uL   RBC 4.26  4.22 - 5.81 MIL/uL   Hemoglobin 12.5 (*) 13.0 - 17.0 g/dL   HCT 62.1 (*) 30.8 - 65.7 %   MCV 86.4  78.0 - 100.0 fL   MCH 29.3  26.0 - 34.0 pg   MCHC 34.0  30.0 - 36.0 g/dL   RDW 84.6  96.2 - 95.2 %   Platelets 288  150 - 400 K/uL  DIFFERENTIAL      Result Value Range   Neutrophils Relative 50  43 - 77 %   Neutro Abs 2.8  1.7 - 7.7 K/uL   Lymphocytes Relative 26  12 - 46 %   Lymphs Abs 1.4  0.7 - 4.0 K/uL   Monocytes Relative 20 (*) 3 - 12 %   Monocytes Absolute 1.1 (*) 0.1 - 1.0 K/uL   Eosinophils Relative 4  0 - 5 %   Eosinophils Absolute 0.2  0.0 - 0.7 K/uL   Basophils Relative 1  0 - 1 %   Basophils Absolute 0.0  0.0 - 0.1 K/uL    Dg Chest 2 View 04/19/2012  *RADIOLOGY REPORT*  Clinical Data: Rule out infiltrate.  CHF.  CHEST - 2 VIEW  Comparison: 03/18/2012  Findings: The heart size and mediastinal contours are within normal limits.  Both lungs are clear. Spondylosis is noted in the thoracic spine.  IMPRESSION: No acute cardiopulmonary abnormalities.   Original Report Authenticated By: Signa Kell, M.D.           Laray Anger, DO 04/20/12 [phone]

## 2012-04-19 NOTE — Consult Note (Addendum)
Reason for Consult: Concern for stroke Referring Physician: Ranae Palms, D  CC: right sided symptoms  History is obtained from:patient  HPI: Paul Baker is a 62 y.o. male with a history of diabetes, cholesterol, hypertension, CAD who was seen in the [facility] ED this morning for multiple complaints including stumbling. He states that he woke up this morning stumbling and had trouble getting his balance. He also notices blood sugars were high. He also noticed that he is having more chest pain the normal. Therefore presented to ER. When there, he was noticed to develop right-sided weakness and therefore a code stroke was called. The CT scanner at [facility] is currently not functioning and therefore he was transferred here for emergent evaluation.  Of note he has had a recent cold  LKW: 10:30 PM last night tpa given: no, outside of window  ROS: A 14 point ROS was performed and is negative except as noted in the HPI.  Past Medical History  Diagnosis Date  . Arthritis   . Type 2 diabetes mellitus   . Mixed hyperlipidemia   . Essential hypertension, benign   . Septic arthritis of knee, left   . OSA (obstructive sleep apnea)     Sleep study 2007  . Coronary atherosclerosis of native coronary artery     Diseased nondominant RCA  . Restless leg syndrome   . Pulmonary embolism 2004  . DVT (deep venous thrombosis)   . Asthma   . Rotator cuff disorder     Family History: Multiple family members with stroke  Social History: Tob: Denies  Exam: Current vital signs: BP 128/70  Pulse 89  Temp(Src) 97.6 F (36.4 C) (Oral)  Resp 16  Ht 5\' 5"  (1.651 m)  Wt 104.781 kg (231 lb)  BMI 38.44 kg/m2  SpO2 100% Vital signs in last 24 hours: Temp:  [97.6 F (36.4 C)-97.8 F (36.6 C)] 97.6 F (36.4 C) (04/18 1320) Pulse Rate:  [86-92] 89 (04/18 1402) Resp:  [15-19] 16 (04/18 1402) BP: (124-142)/(70-85) 128/70 mmHg (04/18 1402) SpO2:  [97 %-100 %] 100 % (04/18 1402) Weight:   [104.781 kg (231 lb)] 104.781 kg (231 lb) (04/18 1041)  General: In bed, no apparent distress CV: Regular rate and rhythm Mental Status: Patient is awake, alert, oriented to person, place, month, year, and situation. Immediate and remote memory are intact. Patient is able to give a clear and coherent history. No signs of aphasia or neglect Cranial Nerves: II: Visual Fields are full. Pupils are equal, round, and reactive to light.  Discs without papilledema. III,IV, VI: EOMI without ptosis or diploplia.  V: Facial sensation is symmetric to temperature VII: Facial movement is symmetric.  VIII: hearing is intact to voice X: Uvula elevates symmetrically XI: Shoulder shrug is symmetric. XII: tongue is midline without atrophy or fasciculations.  Motor: Tone is normal. Bulk is normal. When encouraged, he does not have any drift of his right side. He has give way weakness throughout his right side. He also has a coarse tremor whenever he moves his right side. Sensory: Decreased on right to pin throughout, splits the midline on his forehead Deep Tendon Reflexes: 2+ and symmetric in the biceps and patellae.  Cerebellar: FNF with coarse tremor on the right as he is performing, but he does not past point suggest ataxia. When asked to perform distractive maneuver with contralateral hand the contralateral hand begins to tremor at the same rate as his right hand. His right ankle moves up and down  without ataxia, but also has a coarse tremor as he is moving. Gait: Not tested due to patient safety concerns   I have reviewed labs in epic and the results pertinent to this consultation are: mildly hyponatremic hyperglycemic  I have reviewed the images obtained: CT head-negative  Impression: 62 year old male with a history of multiple stroke risk factors presents with give way weakness and inconsistent tremor of the right arm and leg. I suspect a psychogenic etiology, especially given the  disequilibrium that he presented with earlier I would favor doing an MRI of his brain to rule out acute stroke. Also possible would be functional symptoms superimposed on a peripheral disequilibrium correlated with his recent cold.  Recommendations: 1) MRI brain 2) if MRI brain is normal, I do not feel that he needs a stroke workup as I do not feel like his presentation is consistent with TIA. 3) consider meclizine and conservative therapy for treatment of his disequilibrium if MRI is negative.  Ritta Slot, MD [facility] [phone]  If 7pm- 7am, please page neurology on call at [phone].

## 2012-04-19 NOTE — H&P (Addendum)
[facility] History and Physical  Paul Baker NWG:[id number] DOB: 1950-12-13 DOA: 04/19/2012  Referring physician: Loren Racer, MD PCP: Harlow Asa, MD  Specialists: Simona Huh  Chief Complaint: This is  HPI: Paul Baker is a 62 y.o. male with [place] history of diabetes mellitus type 2, obstructive sleep apnea and coronary artery disease. Patient came in to the hospital because of dizziness and right-sided weakness this morning. Patient said when he woke up this morning he tried to get off of the bed he felt dizzy and when he was walking he was ataxic and he bumped into things. Soon he felt some numbness and weakness in his right side, patient called his primary care physician instructed him to go to the emergency department. Patient was evaluated in [facility] hospital up in [place] with CT scan which was negative on transfer to [facility] as a code stroke. Patient seen by the neurologist Dr. Amada Jupiter who canceled take or strokes because patient minimal symptoms. Upon further questioning patient mentioned that he has chest pain, substernal comes and goes sometimes with exertion and sometimes at rest. He had it this morning and he also had cough, chills and sputum production since 2 days ago. Initial evaluation in the emergency department with chest x-ray showed no evidence of infiltrates or pneumonia, patient has negative enzymes and negative CT scan of the head.  Review of Systems:  Constitutional: negative for anorexia, fevers and sweats Eyes: negative for irritation, redness and visual disturbance Ears, nose, mouth, throat, and face: negative for earaches, epistaxis, nasal congestion and sore throat Respiratory: Cough, dyspnea and sputum production Cardiovascular: Has chest pain Gastrointestinal: negative for abdominal pain, constipation, diarrhea, melena, nausea and vomiting Genitourinary:negative for dysuria, frequency and hematuria Hematologic/lymphatic:  negative for bleeding, easy bruising and lymphadenopathy Musculoskeletal:negative for arthralgias, muscle weakness and stiff joints Neurological: negative for coordination problems, gait problems, headaches and weakness Endocrine: negative for diabetic symptoms including polydipsia, polyuria and weight loss Allergic/Immunologic: negative for anaphylaxis, hay fever and urticaria   Past Medical History  Diagnosis Date  . Arthritis   . Type 2 diabetes mellitus   . Mixed hyperlipidemia   . Essential hypertension, benign   . Septic arthritis of knee, left   . OSA (obstructive sleep apnea)     Sleep study 2007  . Coronary atherosclerosis of native coronary artery     Diseased nondominant RCA  . Restless leg syndrome   . Pulmonary embolism 2004  . DVT (deep venous thrombosis)   . Asthma   . Rotator cuff disorder    Past Surgical History  Procedure Laterality Date  . Total knee arthroplasty    . Lumbar disc surgery      Left L3, L4, L5 discecotomy with decompression of L4 root  . Wrist surgery    . Total knee revision     Social History:  reports that he has quit smoking. His smoking use included Cigarettes. He smoked 0.00 packs per day. He has never used smokeless tobacco. He reports that he does not drink alcohol or use illicit drugs. Lives at home with wife Allergies  Allergen Reactions  . Codeine     History reviewed. No pertinent family history.   Prior to Admission medications   Medication Sig Start Date End Date Taking? Authorizing Provider  amLODipine (NORVASC) 5 MG tablet Take 1 tablet (5 mg total) by mouth daily. 11/29/11   Jonelle Sidle, MD  aspirin 81 MG tablet Take 81 mg by mouth 2 (  two) times daily.     Historical Provider, MD  fenofibrate 160 MG tablet Take 160 mg by mouth daily.    Historical Provider, MD  glyBURIDE (DIABETA) 5 MG tablet Take 2 tablets (10 mg total) by mouth 2 (two) times daily. 03/20/12   Merlyn Albert, MD  isosorbide mononitrate (IMDUR)  60 MG 24 hr tablet TAKE (1/2) TABLET BY MOUTH TWICE DAILY. 03/11/12   Jonelle Sidle, MD  LANTUS SOLOSTAR 100 UNIT/ML injection Inject 10 Units into the skin at bedtime and may repeat dose one time if needed.  11/21/11   Historical Provider, MD  losartan (COZAAR) 100 MG tablet Take 100 mg by mouth daily.  04/18/10   Historical Provider, MD  metFORMIN (GLUCOPHAGE) 500 MG tablet Take 500 mg by mouth 2 (two) times daily with a meal. 2 tabs am 1 tab lunch 2 tabs pm 04/12/10   Historical Provider, MD  metoprolol succinate (TOPROL-XL) 50 MG 24 hr tablet TAKE (1) TABLET BY MOUTH TWICE DAILY. 02/07/12   Jodelle Gross, NP  Multiple Vitamins-Minerals (MULTIVITAMIN WITH MINERALS) tablet Take 1 tablet by mouth daily.      Historical Provider, MD  NITROQUICK 0.4 MG SL tablet DISSOLVE 1 TABLET UNDER TONGUE EVERY 5 MINUTES UP TO 15 MIN FOR CHESTPAIN. IF NO RELIEF CALL 911. 09/07/11   Jonelle Sidle, MD  Omega-3 Fatty Acids (FISH OIL) 1000 MG CAPS Take 1 capsule by mouth daily.      Historical Provider, MD  rOPINIRole (REQUIP) 4 MG tablet Take 4 mg by mouth at bedtime.  04/18/10   Historical Provider, MD  rosuvastatin (CRESTOR) 5 MG tablet Take 1 tablet (5 mg total) by mouth daily. 12/22/11   Jonelle Sidle, MD  triamterene-hydrochlorothiazide (MAXZIDE-25) 37.5-25 MG per tablet Take 1 tablet by mouth daily.      Historical Provider, MD   Physical Exam: Filed Vitals:   04/19/12 1320 04/19/12 1330 04/19/12 1345 04/19/12 1402  BP: 129/72 127/78 124/76 128/70  Pulse: 88 86 87 89  Temp: 97.6 F (36.4 C)     TempSrc: Oral     Resp: 18 19 18 16   Height:      Weight:      SpO2: 99% 98% 99% 100%   General appearance: alert, cooperative and no distress  Head: Normocephalic, without obvious abnormality, atraumatic  Eyes: conjunctivae/corneas clear. PERRL, EOM's intact. Fundi benign.  Nose: Nares normal. Septum midline. Mucosa normal. No drainage or sinus tenderness.  Throat: lips, mucosa, and tongue normal;  teeth and gums normal  Neck: Supple, no masses, no cervical lymphadenopathy, no JVD appreciated, no meningeal signs Resp: clear to auscultation bilaterally  Chest wall: no tenderness  Cardio: regular rate and rhythm, S1, S2 normal, no murmur, click, rub or gallop  GI: soft, non-tender; bowel sounds normal; no masses, no organomegaly  Extremities: extremities normal, atraumatic, no cyanosis or edema  Skin: Skin color, texture, turgor normal. No rashes or lesions  Neurologic: Alert and oriented X 3, normal strength and tone. Normal symmetric reflexes. Normal coordination and gait   Labs on Admission:  Basic Metabolic Panel:  Recent Labs Lab 04/19/12 1142  NA 132*  K 4.1  CL 95*  CO2 23  GLUCOSE 317*  BUN 21  CREATININE 1.17  CALCIUM 9.8   Liver Function Tests:  Recent Labs Lab 04/19/12 1205  AST 23  ALT 30  ALKPHOS 46  BILITOT 0.2*  PROT 7.4  ALBUMIN 4.0   No results found for this basename:  LIPASE, AMYLASE,  in the last 168 hours No results found for this basename: AMMONIA,  in the last 168 hours CBC:  Recent Labs Lab 04/19/12 1205  WBC 5.5  NEUTROABS 2.8  HGB 12.5*  HCT 36.8*  MCV 86.4  PLT 288   Cardiac Enzymes:  Recent Labs Lab 04/19/12 1142  TROPONINI <0.30    BNP (last 3 results)  Recent Labs  04/19/12 1142  PROBNP 13.3   CBG: No results found for this basename: GLUCAP,  in the last 168 hours  Radiological Exams on Admission: Dg Chest 2 View  04/19/2012  *RADIOLOGY REPORT*  Clinical Data: Rule out infiltrate.  CHF.  CHEST - 2 VIEW  Comparison: 03/18/2012  Findings: The heart size and mediastinal contours are within normal limits.  Both lungs are clear. Spondylosis is noted in the thoracic spine.  IMPRESSION: No acute cardiopulmonary abnormalities.   Original Report Authenticated By: Signa Kell, M.D.    Ct Head Wo Contrast  04/19/2012  *RADIOLOGY REPORT*  Clinical Data: Right-sided weakness  CT HEAD WITHOUT CONTRAST  Technique:   Contiguous axial images were obtained from the base of the skull through the vertex without contrast.  Comparison: None.  Findings: The brain has a normal appearance without evidence for hemorrhage, infarction, hydrocephalus, or mass lesion.  There is no extra axial fluid collection.  The skull and paranasal sinuses are normal.  IMPRESSION: Normal exam.   Original Report Authenticated By: Signa Kell, M.D.     EKG: Independently reviewed.   Assessment/Plan Principal Problem:   Chest pain Active Problems:   DM   HYPERLIPIDEMIA   Essential hypertension, benign   CORONARY ATHEROSCLEROSIS NATIVE CORONARY ARTERY   OSA on CPAP   Dizziness   Dizziness and right-sided weakness -As mentioned above patient was sent from outside hospital for code stroke. -CT scan is negative, neurology canceled the code stroke. -MRI to be done, if positive will pursue stroke workup. -Patient symptoms sounds like dizziness rather than its CVA symptoms.  Chest pain/CAD -Patient reported that he has overnight percent blockage in his RCA. -Patient follows with [facility], he is on Imdur, beta blockers and aspirin. -Reported recently his started to have chest pain every day was about every exertion. -He had this chest pain this morning, cardiology to evaluate.  Acute bronchitis -Patient reported cough, chills for the past 2 days. -CXR showed no evidence of pneumonia, started on Levaquin for acute bronchitis. -Supportive management with mucolytics, bronchodilators and oxygen as needed.  Obstructive sleep apnea -Patient is on CPAP continue.  Diabetes mellitus type 2 -Carbohydrate modified diet, check hemoglobin A1c. -Hold all medications and is otherwise and sulci are scale while he is in the hospital.   Code Status: Full code Family Communication: Plan discussed with the patient in the presence of his wife and daughter at bedside. Disposition Plan: Inpatient, telemetry, anticipated length of stay  is more than 2 midnights.   Time spent: 70 minutes  [facility] A [facility] Pager [phone]  If 7PM-7AM, please contact night-coverage [email or web address] Password [facility] 04/19/2012, 2:39 PM

## 2012-04-19 NOTE — Telephone Encounter (Signed)
Patient states that he is having chest pain.  States that he has cold and thinks he may have Pneumonia.  Patient was advised to contact PCP but still wanted to speak with nurse. / tgs

## 2012-04-19 NOTE — ED Notes (Signed)
Pt. Examined by EDP

## 2012-04-19 NOTE — Code Documentation (Signed)
62 yo male who presented this morning to [facility] after awakening with altered gait, stumbling at 0530. He was normal when he went to bed at 2230.  His chief complaint was of headache and chest pain. CBG was reportedly in the 400s at home and 317 at [facility].  Shortly afternoon, he was noted to have a R pronator drift, and because their CT scanner was down, he was transported to [facility] with code stroke activated at 1217.  (see fields for other times).  After detailed interrogation, it was determined he was not a candidate for tPA: outside the window,and sx very mild with NIHSS only 1 for subjective sensory loss on the R. He does split the midline.  Code stroke was canceled at 1316.  Handoff done with ED RN.

## 2012-04-20 DIAGNOSIS — E871 Hypo-osmolality and hyponatremia: Secondary | ICD-10-CM | POA: Diagnosis present

## 2012-04-20 DIAGNOSIS — I2510 Atherosclerotic heart disease of native coronary artery without angina pectoris: Principal | ICD-10-CM

## 2012-04-20 DIAGNOSIS — I10 Essential (primary) hypertension: Secondary | ICD-10-CM

## 2012-04-20 DIAGNOSIS — E119 Type 2 diabetes mellitus without complications: Secondary | ICD-10-CM

## 2012-04-20 DIAGNOSIS — R42 Dizziness and giddiness: Secondary | ICD-10-CM

## 2012-04-20 DIAGNOSIS — R079 Chest pain, unspecified: Secondary | ICD-10-CM

## 2012-04-20 LAB — BASIC METABOLIC PANEL
CO2: 21 mEq/L (ref 19–32)
Calcium: 9.4 mg/dL (ref 8.4–10.5)
Chloride: 99 mEq/L (ref 96–112)
Glucose, Bld: 253 mg/dL — ABNORMAL HIGH (ref 70–99)
Potassium: 4.4 mEq/L (ref 3.5–5.1)
Sodium: 132 mEq/L — ABNORMAL LOW (ref 135–145)

## 2012-04-20 LAB — CBC
Hemoglobin: 12.6 g/dL — ABNORMAL LOW (ref 13.0–17.0)
MCV: 85.1 fL (ref 78.0–100.0)
Platelets: 270 10*3/uL (ref 150–400)
RBC: 4.29 MIL/uL (ref 4.22–5.81)
WBC: 5.5 10*3/uL (ref 4.0–10.5)

## 2012-04-20 LAB — TROPONIN I: Troponin I: <0.3 ng/mL (ref <0.30)

## 2012-04-20 LAB — HEMOGLOBIN A1C
Hgb A1c MFr Bld: 9.1 % — ABNORMAL HIGH (ref <5.7)
Mean Plasma Glucose: 214 mg/dL — ABNORMAL HIGH (ref <117)

## 2012-04-20 LAB — GLUCOSE, CAPILLARY: Glucose-Capillary: 246 mg/dL — ABNORMAL HIGH (ref 70–99)

## 2012-04-20 MED ORDER — ALBUTEROL SULFATE (5 MG/ML) 0.5% IN NEBU: 2.5000 mg | INHALATION_SOLUTION | RESPIRATORY_TRACT | Status: DC | PRN

## 2012-04-20 MED ORDER — INSULIN GLARGINE 100 UNIT/ML ~~LOC~~ SOLN
55.0000 [IU] | Freq: Every day | SUBCUTANEOUS | Status: DC
Start: 2012-04-20 — End: 2012-04-20
  Filled 2012-04-20: qty 0.55

## 2012-04-20 MED ORDER — INSULIN GLARGINE 100 UNIT/ML ~~LOC~~ SOLN
55.0000 [IU] | Freq: Every day | SUBCUTANEOUS | Status: DC
  Administered 2012-04-20: 55 [IU] via SUBCUTANEOUS
  Filled 2012-04-20 (×2): qty 0.55

## 2012-04-20 NOTE — Progress Notes (Signed)
Subjective: Symptoms much improved from yesterday  Exam: Filed Vitals:   04/20/12 0600  BP: 112/70  Pulse: 85  Temp: 97.7 F (36.5 C)  Resp: 20   Gen: In bed, NAD MS: Awake, ALert, interactive and appropriate CN:VFF, PERRL Motor: some give way weakness of RUE, tremor is much improved. Of note, wwhen testing, he has tremor, but when he reaches out to shake my hand, there is none.  Sensory:decreased on left subjectively to LT.  Impression: 62 yo M with distractible right sided tremor, splitting midline sensory deficits and normal MRI. At this time, I feel that a conversion reaction to be his diagnosis, no further recommendations at this time.   Recommendations: 1)Neurology will sign off, please call with any further questions.   Ritta Slot, MD [facility] [phone]  If 7pm- 7am, please page neurology on call at [phone].

## 2012-04-20 NOTE — Progress Notes (Signed)
PROGRESS NOTE  Subjective:   62 yo with history of CAD, HTN, DM, VTE, and back/knee surgery presented to the ER at [facility]  4/18  with difficulty with balance.   He was found to have markedly elevated glucose.    In terms of CAD, he has known diffuse severe disease in a nondominant RCA (no PCI options) and has been medically treated for chronic angina for a number of years  Cardiac enzymes are negative.    Objective:    Vital Signs:   Temp:  [97.6 F (36.4 C)-98.4 F (36.9 C)] 97.7 F (36.5 C) (04/19 0600) Pulse Rate:  [75-94] 85 (04/19 0600) Resp:  [14-20] 20 (04/19 0600) BP: (95-142)/(47-85) 112/70 mmHg (04/19 0600) SpO2:  [92 %-100 %] 92 % (04/19 0600) Weight:  [231 lb (104.781 kg)-234 lb 6.4 oz (106.323 kg)] 234 lb 6.4 oz (106.323 kg) (04/18 1607)  Last BM Date: 04/18/12   24-hour weight change: Weight change:   Weight trends: Filed Weights   04/19/12 1041 04/19/12 1607  Weight: 231 lb (104.781 kg) 234 lb 6.4 oz (106.323 kg)    Intake/Output:  04/18 0701 - 04/19 0700 In: -  Out: 1300 [Urine:1300]      Physical Exam: BP 112/70  Pulse 85  Temp(Src) 97.7 F (36.5 C) (Oral)  Resp 20  Ht 5\' 5"  (1.651 m)  Wt 234 lb 6.4 oz (106.323 kg)  BMI 39.01 kg/m2  SpO2 92%  General: Vital signs reviewed and noted.   Head: Normocephalic, atraumatic.  Eyes: conjunctivae/corneas clear.  EOM's intact.   Throat: normal  Neck:  normal  Lungs:   clear  Heart:  RR  Abdomen:  Soft, non-tender, non-distended    Extremities: No edema   Neurologic: A&O X3, CN II - XII are grossly intact.   Psych: Normal     Labs: BMET:  Recent Labs  04/19/12 1142 04/20/12 0650  NA 132* 132*  K 4.1 4.4  CL 95* 99  CO2 23 21  GLUCOSE 317* 253*  BUN 21 22  CREATININE 1.17 1.26  CALCIUM 9.8 9.4    Liver function tests:  Recent Labs  04/19/12 1205  AST 23  ALT 30  ALKPHOS 46  BILITOT 0.2*  PROT 7.4  ALBUMIN 4.0   No results found for this basename: LIPASE,  AMYLASE,  in the last 72 hours  CBC:  Recent Labs  04/19/12 1205 04/20/12 0650  WBC 5.5 5.5  NEUTROABS 2.8  --   HGB 12.5* 12.6*  HCT 36.8* 36.5*  MCV 86.4 85.1  PLT 288 270    Cardiac Enzymes:  Recent Labs  04/19/12 1142 04/19/12 1630 04/19/12 2300 04/20/12 0650  TROPONINI <0.30 <0.30 <0.30 <0.30    Coagulation Studies:  Recent Labs  04/19/12 1205  LABPROT 12.0  INR 0.89    Other: No components found with this basename: POCBNP,  No results found for this basename: DDIMER,  in the last 72 hours No results found for this basename: HGBA1C,  in the last 72 hours No results found for this basename: CHOL, HDL, LDLCALC, TRIG, CHOLHDL,  in the last 72 hours No results found for this basename: TSH, T4TOTAL, FREET3, T3FREE, THYROIDAB,  in the last 72 hours No results found for this basename: VITAMINB12, FOLATE, FERRITIN, TIBC, IRON, RETICCTPCT,  in the last 72 hours   Other results:  Tele:  NSR.   Medications:    Infusions: . sodium chloride 75 mL/hr at 04/20/12 0846    Scheduled Medications: .  amLODipine  5 mg Oral Daily  . aspirin  81 mg Oral BID  . atorvastatin  40 mg Oral q1800  . fenofibrate  160 mg Oral Daily  . guaiFENesin  1,200 mg Oral BID  . heparin  5,000 Units Subcutaneous Q8H  . insulin aspart  0-15 Units Subcutaneous TID WC  . insulin glargine  40 Units Subcutaneous QHS  . isosorbide mononitrate  30 mg Oral Daily  . levofloxacin (LEVAQUIN) IV  750 mg Intravenous Q24H  . losartan  100 mg Oral Daily  . metoprolol succinate  50 mg Oral Daily  . ranolazine  500 mg Oral BID  . rOPINIRole  4 mg Oral QHS  . sodium chloride  3 mL Intravenous Q12H  . triamterene-hydrochlorothiazide  1 each Oral Daily    Assessment/ Plan:   Principal Problem:   Chest pain Active Problems:   DM   HYPERLIPIDEMIA   Essential hypertension, benign   CORONARY ATHEROSCLEROSIS NATIVE CORONARY ARTERY   OSA on CPAP   Dizziness   His CP seems to be chronic.   Troponin levels are negative He has disease is a small, non-dominant RCA that is not a candidate for PCI.  We will continue with medical therapy.  I agree with Ranexa which was added yesterday.  I think his symptoms are due to his elevated glucose (and associated complications) and have nothing to do with his heart.     Disposition:  Length of Stay: 1  Vesta Mixer, Montez Hageman., MD, [facility] 04/20/2012, 8:56 AM Office [phone] Pager [phone]

## 2012-04-20 NOTE — Progress Notes (Signed)
[facility] PROGRESS NOTE  Assessment/Plan: Chest pain/CAD  - Cardiac markers -x3, EKG shows SR no T wave abnormality. No events on telemetry. - Patient follows with [facility], he is on Imdur, beta blockers and aspirin.  - consult cardiology   Dizziness due to hyperglycemia (BG running > 400 at home)/hyponatremia: - CT scan is negative, neurology canceled the code stroke.  - MRI to as below  - EKG shows SR. No events on telemetry. - Check orthostatic. Cont IV fluids. Hold diuretics. - B-met in am  DM II: - Hold metformin. - Increase Lantuss cont SSI. - Check a HbgA1c    Essential hypertension, benign - good control. - hold HCTZ.   HYPERLIPIDEMIA - cont statins.   Code Status: Full code  Family Communication: Plan discussed with the patient in the presence of his wife and daughter at bedside.  Disposition Plan: Inpatient, telemetry, anticipated length of stay is more than 2 midnights.     Consultants:  Neurology  Procedures: MRI 4.19.2014:No acute intracranial abnormality.  Small focus of left frontal lobe encephalomalacia versus artifact.    Antibiotics:  None  HPI/Subjective: Dizziness resolved. Chest pain free.   Objective: Filed Vitals:   04/20/12 0200 04/20/12 0400 04/20/12 0600 04/20/12 0800  BP: 102/51 100/56 112/70 127/78  Pulse: 75 83 85 93  Temp: 98.3 F (36.8 C) 98.1 F (36.7 C) 97.7 F (36.5 C) 98.2 F (36.8 C)  TempSrc:    Oral  Resp: 18 18 20 17   Height:    5\' 5"  (1.651 m)  Weight:    106.142 kg (234 lb)  SpO2: 95% 96% 92% 96%    Intake/Output Summary (Last 24 hours) at 04/20/12 1028 Last data filed at 04/20/12 0800  Gross per 24 hour  Intake    400 ml  Output   1300 ml  Net   -900 ml   Filed Weights   04/19/12 1041 04/19/12 1607 04/20/12 0800  Weight: 104.781 kg (231 lb) 106.323 kg (234 lb 6.4 oz) 106.142 kg (234 lb)    Exam:  General: Alert, awake, oriented x3, in no acute distress.  HEENT: No bruits,  no goiter.  Heart: Regular rate and rhythm, without murmurs, rubs, gallops.  Lungs: Good air movement, clear to auscultation. Abdomen: Soft, nontender, nondistended, positive bowel sounds.  Neuro: Grossly intact, nonfocal.   Data Reviewed: Basic Metabolic Panel:  Recent Labs Lab 04/19/12 1142 04/20/12 0650  NA 132* 132*  K 4.1 4.4  CL 95* 99  CO2 23 21  GLUCOSE 317* 253*  BUN 21 22  CREATININE 1.17 1.26  CALCIUM 9.8 9.4   Liver Function Tests:  Recent Labs Lab 04/19/12 1205  AST 23  ALT 30  ALKPHOS 46  BILITOT 0.2*  PROT 7.4  ALBUMIN 4.0   No results found for this basename: LIPASE, AMYLASE,  in the last 168 hours No results found for this basename: AMMONIA,  in the last 168 hours CBC:  Recent Labs Lab 04/19/12 1205 04/20/12 0650  WBC 5.5 5.5  NEUTROABS 2.8  --   HGB 12.5* 12.6*  HCT 36.8* 36.5*  MCV 86.4 85.1  PLT 288 270   Cardiac Enzymes:  Recent Labs Lab 04/19/12 1142 04/19/12 1630 04/19/12 2300 04/20/12 0650  TROPONINI <0.30 <0.30 <0.30 <0.30   BNP (last 3 results)  Recent Labs  04/19/12 1142  PROBNP 13.3   CBG:  Recent Labs Lab 04/19/12 1248 04/19/12 1610 04/19/12 2146 04/20/12 0725  GLUCAP 270* 202* 262* 246*  No results found for this or any previous visit (from the past 240 hour(s)).   Studies: Dg Chest 2 View  04/19/2012  *RADIOLOGY REPORT*  Clinical Data: Rule out infiltrate.  CHF.  CHEST - 2 VIEW  Comparison: 03/18/2012  Findings: The heart size and mediastinal contours are within normal limits.  Both lungs are clear. Spondylosis is noted in the thoracic spine.  IMPRESSION: No acute cardiopulmonary abnormalities.   Original Report Authenticated By: Signa Kell, M.D.    Ct Head Wo Contrast  04/19/2012  *RADIOLOGY REPORT*  Clinical Data: Right-sided weakness  CT HEAD WITHOUT CONTRAST  Technique:  Contiguous axial images were obtained from the base of the skull through the vertex without contrast.  Comparison: None.   Findings: The brain has a normal appearance without evidence for hemorrhage, infarction, hydrocephalus, or mass lesion.  There is no extra axial fluid collection.  The skull and paranasal sinuses are normal.  IMPRESSION: Normal exam.   Original Report Authenticated By: Signa Kell, M.D.    Mr Brain Wo Contrast  04/19/2012  *RADIOLOGY REPORT*  Clinical Data: 62 year old male with dizziness and right-sided weakness. Ataxia.  History of diabetes, sleep apnea.  MRI HEAD WITHOUT CONTRAST  Technique:  Multiplanar, multiecho pulse sequences of the brain and surrounding structures were obtained according to standard protocol without intravenous contrast.  Comparison: Head CT 04/19/2012.  Findings: Cerebral volume is within normal limits for age.  No restricted diffusion to suggest acute infarction.  No midline shift, mass effect, evidence of mass lesion, ventriculomegaly, extra-axial collection or acute intracranial hemorrhage. Cervicomedullary junction and pituitary are within normal limits. Major intracranial vascular flow voids are preserved.  Scattered punctate foci of subcortical white matter T2 and FLAIR hyperintensity in the cerebral hemispheres, are themselves within normal limits for this age.  There may be a small area of linear chronic encephalomalacia in the left frontal operculum on series 6 image 15, uncertain.  This might also represent a developmental venous anomaly.  Normal signal in the deep gray matter nuclei, brainstem and cerebellum.  The right side visible internal auditory structures appear grossly normal.  The left side seventh and eighth cranial nerves are difficult to visualize which might be artifact.  Mastoids are clear.  Negative visualized cervical spine.  Normal bone marrow signal. Visualized orbit soft tissues are within normal limits.  Mild paranasal sinus mucosal thickening.  Negative scalp soft tissues.  IMPRESSION: No acute intracranial abnormality. Small focus of left frontal lobe  encephalomalacia versus artifact. Otherwise negative for age noncontrast MRI appearance the brain.   Original Report Authenticated By: Erskine Speed, M.D.     Scheduled Meds: . amLODipine  5 mg Oral Daily  . aspirin  81 mg Oral BID  . atorvastatin  40 mg Oral q1800  . fenofibrate  160 mg Oral Daily  . guaiFENesin  1,200 mg Oral BID  . heparin  5,000 Units Subcutaneous Q8H  . insulin aspart  0-15 Units Subcutaneous TID WC  . insulin glargine  40 Units Subcutaneous QHS  . isosorbide mononitrate  30 mg Oral Daily  . levofloxacin (LEVAQUIN) IV  750 mg Intravenous Q24H  . losartan  100 mg Oral Daily  . metoprolol succinate  50 mg Oral Daily  . ranolazine  500 mg Oral BID  . rOPINIRole  4 mg Oral QHS  . sodium chloride  3 mL Intravenous Q12H  . triamterene-hydrochlorothiazide  1 each Oral Daily   Continuous Infusions: . sodium chloride 75 mL/hr at 04/20/12 0846  Marinda Elk  [facility] Pager [phone]. If 8PM-8AM, please contact night-coverage at [email or web address], password [facility] 04/20/2012, 10:28 AM  LOS: 1 day

## 2012-04-20 NOTE — Progress Notes (Signed)
Nutrition Brief Note  Patient identified on the Malnutrition Screening Tool (MST) Report for weight loss. Per review of Malnutrition Screening Tool, 16 lb weight loss was intentional.  Body mass index is 39.01 kg/(m^2). Patient meets criteria for obesity, class 2, based on current BMI.   Current diet order is CHO-modified medium, patient is consuming approximately 100%% of meals at this time. Labs and medications reviewed.   No nutrition interventions warranted at this time. If nutrition issues arise, please consult RD.   Joaquin Courts, RD, LDN, CNSC Pager [phone] After Hours Pager [phone]

## 2012-04-21 DIAGNOSIS — G8190 Hemiplegia, unspecified affecting unspecified side: Secondary | ICD-10-CM

## 2012-04-21 LAB — BASIC METABOLIC PANEL
BUN: 22 mg/dL (ref 6–23)
Calcium: 10.1 mg/dL (ref 8.4–10.5)
GFR calc Af Amer: 67 mL/min — ABNORMAL LOW (ref >90)
GFR calc non Af Amer: 58 mL/min — ABNORMAL LOW (ref >90)
Glucose, Bld: 191 mg/dL — ABNORMAL HIGH (ref 70–99)

## 2012-04-21 LAB — HEMOGLOBIN A1C
Hgb A1c MFr Bld: 8.8 % — ABNORMAL HIGH (ref <5.7)
Mean Plasma Glucose: 206 mg/dL — ABNORMAL HIGH (ref <117)

## 2012-04-21 MED ORDER — AMLODIPINE BESYLATE 5 MG PO TABS: 5.0000 mg | ORAL_TABLET | Freq: Every day | ORAL | Status: DC

## 2012-04-21 MED ORDER — INSULIN GLARGINE 100 UNIT/ML ~~LOC~~ SOLN: 55.0000 [IU] | Freq: Every day | SUBCUTANEOUS | Status: DC

## 2012-04-21 MED ORDER — METFORMIN HCL 500 MG PO TABS: 1000.0000 mg | ORAL_TABLET | Freq: Two times a day (BID) | ORAL | Status: DC

## 2012-04-21 MED ORDER — RANOLAZINE ER 500 MG PO TB12: 500.0000 mg | ORAL_TABLET | Freq: Two times a day (BID) | ORAL | Status: DC

## 2012-04-21 MED ORDER — METFORMIN HCL 500 MG PO TABS
1000.0000 mg | ORAL_TABLET | Freq: Two times a day (BID) | ORAL | Status: DC
  Administered 2012-04-21: 1000 mg via ORAL
  Filled 2012-04-21 (×3): qty 2

## 2012-04-21 NOTE — Discharge Instructions (Signed)
Acute Coronary Syndrome °Acute coronary syndrome (ACS) is an urgent problem in which the blood and oxygen supply to the heart is critically deficient. ACS requires hospitalization because one or more coronary arteries may be blocked. °ACS represents a range of conditions including: °· Previous angina that is now unstable, lasts longer, happens at rest, or is more intense. °· A heart attack, with heart muscle cell injury and death. °There are three vital coronary arteries that supply the heart muscle with blood and oxygen so that it can pump blood effectively. If blockages to these arteries develop, blood flow to the heart muscle is reduced. If the heart does not get enough blood, angina may occur as the first warning sign. °SYMPTOMS  °· The most common signs of angina include: °· Tightness or squeezing in the chest. °· Feeling of heaviness on the chest. °· Discomfort in the arms, neck, or jaw. °· Shortness of breath and nausea. °· Cold, wet skin. °· Angina is usually brought on by physical effort or excitement which increase the oxygen needs of the heart. These states increase the blood flow needs of the heart beyond what can be delivered. °TREATMENT  °· Medicines to help discomfort may include nitroglycerin (nitro) in the form of tablets or a spray for rapid relief, or longer-acting forms such as cream, patches, or capsules. (Be aware that there are many side effects and possible interactions with other drugs). °· Other medicines may be used to help the heart pump better. °· Procedures to open blocked arteries including angioplasty or stent placement to keep the arteries open. °· Open heart surgery may be needed when there are many blockages or they are in critical locations that are best treated with surgery. °HOME CARE INSTRUCTIONS  °· Avoid smoking. °· Take one baby or adult aspirin daily, if your caregiver advises. This helps reduce the risk of a heart attack. °· It is very important that you follow the angina  treatment prescribed by your caregiver. Make arrangements for proper follow-up care. °· Eat a heart healthy diet with salt and fat restrictions as advised. °· Regular exercise is good for you as long as it does not cause discomfort. Do not begin any new type of exercise until you check with your caregiver. °· If you are overweight, you should lose weight. °· Try to maintain normal blood lipid levels. °· Keep your blood pressure under control as recommended by your caregiver. °· You should tell your caregiver right away about any increase in the severity or frequency of your chest discomfort or angina attacks. When you have angina, you should stop what you are doing and sit down. This may bring relief in 3 to 5 minutes. If your caregiver has prescribed nitro, take it as directed. °· If your caregiver has given you a follow-up appointment, it is very important to keep that appointment. Not keeping the appointment could result in a chronic or permanent injury, pain, and disability. If there is any problem keeping the appointment, you must call back to this facility for assistance. °SEEK IMMEDIATE MEDICAL CARE IF:  °· You develop nausea, vomiting, or shortness of breath. °· You feel faint, lightheaded, or pass out. °· Your chest discomfort gets worse. °· You are sweating or experience sudden profound fatigue. °· You do not get relief of your chest pain after 3 doses of nitro. °· Your discomfort lasts longer than 15 minutes. °MAKE SURE YOU:  °· Understand these instructions. °· Will watch your condition. °· Will get help   right away if you are not doing well or get worse. Document Released: 12/19/2004 Document Revised: 03/13/2011 Document Reviewed: 07/23/2007 [facility] Patient Information 2013 [place], [place]. STROKE/TIA DISCHARGE INSTRUCTIONS SMOKING Cigarette smoking nearly doubles your risk of having a stroke & is the single most alterable risk factor  If you smoke or have smoked in the last 12 months, you are  advised to quit smoking for your health.  Most of the excess cardiovascular risk related to smoking disappears within a year of stopping.  Ask you doctor about anti-smoking medications  [place] Quit Line: 1-800-QUIT NOW  Free Smoking Cessation Classes [phone]  CHOLESTEROL Know your levels; limit fat & cholesterol in your diet  Lipid Panel     Component Value Date/Time   CHOL 137 11/23/2010 0845   TRIG 215* 11/23/2010 0845   HDL 36* 11/23/2010 0845   CHOLHDL 3.8 11/23/2010 0845   VLDL 43* 11/23/2010 0845   LDLCALC 58 11/23/2010 0845      Many patients benefit from treatment even if their cholesterol is at goal.  Goal: Total Cholesterol (CHOL) less than 160  Goal:  Triglycerides (TRIG) less than 150  Goal:  HDL greater than 40  Goal:  LDL (LDLCALC) less than 100   BLOOD PRESSURE American Stroke Association blood pressure target is less that 120/80 mm/Hg  Your discharge blood pressure is:  BP: 122/76 mmHg  Monitor your blood pressure  Limit your salt and alcohol intake  Many individuals will require more than one medication for high blood pressure  DIABETES (A1c is a blood sugar average for last 3 months) Goal HGBA1c is under 7% (HBGA1c is blood sugar average for last 3 months)  Diabetes: Diagnosis of diabetes:  Your A1c:8.8 %    Lab Results  Component Value Date   HGBA1C 8.8* 04/20/2012     Your HGBA1c can be lowered with medications, healthy diet, and exercise.  Check your blood sugar as directed by your physician  Call your physician if you experience unexplained or low blood sugars.  PHYSICAL ACTIVITY/REHABILITATION Goal is 30 minutes at least 4 days per week  Activity: Increase activity slowly, Therapies:  Return to work:   Activity decreases your risk of heart attack and stroke and makes your heart stronger.  It helps control your weight and blood pressure; helps you relax and can improve your mood.  Participate in a regular exercise program.  Talk with  your doctor about the best form of exercise for you (dancing, walking, swimming, cycling).  DIET/WEIGHT Goal is to maintain a healthy weight  Your discharge diet is:  Your height is:  Height: 5\' 5"  (165.1 cm) Your current weight is: Weight: 106.142 kg (234 lb) Your Body Mass Index (BMI) is:  BMI (Calculated): 39  Following the type of diet specifically designed for you will help prevent another stroke.  Your goal weight range is:  124-158 lbs  Your goal Body Mass Index (BMI) is 19-24.  Healthy food habits can help reduce 3 risk factors for stroke:  High cholesterol, hypertension, and excess weight.  RESOURCES Stroke/Support Group:  Call [phone]   STROKE EDUCATION PROVIDED/REVIEWED AND GIVEN TO PATIENT Stroke warning signs and symptoms How to activate emergency medical system (call 911). Medications prescribed at discharge. Need for follow-up after discharge. Personal risk factors for stroke. Pneumonia vaccine given: No Flu vaccine given: No My questions have been answered, the writing is legible, and I understand these instructions.  I will adhere to these goals & educational materials that  have been provided to me after my discharge from the hospital.

## 2012-04-21 NOTE — Discharge Summary (Signed)
Physician Discharge Summary  Paul Baker OZH:[id number] DOB: December 16, 1950 DOA: 04/19/2012  PCP: Harlow Asa, MD  Admit date: 04/19/2012 Discharge date: 04/21/2012  Time spent: 35 minutes  Recommendations for Outpatient Follow-up:  1. follow up with PCP adjust diabtes medication as tolerate it.  Discharge Diagnoses:  Principal Problem:   Chest pain Active Problems:   DM   HYPERLIPIDEMIA   Essential hypertension, benign   CORONARY ATHEROSCLEROSIS NATIVE CORONARY ARTERY   OSA on CPAP   Dizziness   Hyponatremia   Discharge Condition: stable  Diet recommendation: carb modified  Filed Weights   04/19/12 1041 04/19/12 1607 04/20/12 0800  Weight: 104.781 kg (231 lb) 106.323 kg (234 lb 6.4 oz) 106.142 kg (234 lb)    History of present illness:  62 y.o. male with [place] history of diabetes mellitus type 2, obstructive sleep apnea and coronary artery disease. Patient came in to the hospital because of dizziness and right-sided weakness this morning. Patient said when he woke up this morning he tried to get off of the bed he felt dizzy and when he was walking he was ataxic and he bumped into things. Soon he felt some numbness and weakness in his right side, patient called his primary care physician instructed him to go to the emergency department. Patient was evaluated in [facility] hospital up in [place] with CT scan which was negative on transfer to [facility] as a code stroke. Patient seen by the neurologist Dr. Amada Jupiter who canceled take or strokes because patient minimal symptoms. Upon further questioning patient mentioned that he has chest pain, substernal comes and goes sometimes with exertion and sometimes at rest. He had it this morning and he also had cough, chills and sputum production since 2 days ago. Initial evaluation in the emergency department with chest x-ray showed no evidence of infiltrates or pneumonia, patient has negative enzymes and negative CT scan of the  head.   Hospital Course:  Chest pain/CAD  - Cardiac markers -x3, EKG shows SR no T wave abnormality. No events on telemetry.  - Patient follows with [place] cardiology, he is on Imdur, beta blockers and aspirin.  - consult cardiology recommended medical management, start Ranexa.  Dizziness due to hyperglycemia (BG running > 400 at home)/hyponatremia:  - CT scan is negative, neurology canceled the code stroke.  - MRI negative ( see  to as below ). - EKG shows SR. No events on telemetry.  - negative orthostatic.   DM II:  - resume metformin.  - Increase Lantuss cont SSI.  - 8.8  HbgA1c.  Essential hypertension, benign  - good control.  - resume home meds.  HYPERLIPIDEMIA  - cont statins.   Procedures:  MRI as below  Ct head as belos  Consultations:  Neuro cards.  Discharge Exam: Filed Vitals:   04/20/12 1600 04/20/12 2100 04/21/12 0500 04/21/12 0800  BP: 135/74 118/70 121/79 122/76  Pulse: 85 83 80 80  Temp: 97.7 F (36.5 C) 97.7 F (36.5 C) 98 F (36.7 C) 97.9 F (36.6 C)  TempSrc:    Oral  Resp: 18 20 18 18   Height:      Weight:      SpO2: 96% 97% 95% 96%    General: A&O x3 Cardiovascular: RRR Respiratory: good air movement CTA B/L  Discharge Instructions  Discharge Orders   Future Appointments Provider Department Dept Phone   04/23/2012 8:10 AM Merlyn Albert, MD [facility] [phone]   05/01/2012 10:15 AM Porfirio Mylar Dohmeier,  MD [facility] [phone]   06/03/2012 1:20 PM Jonelle Sidle, MD [facility] [place] [phone]   Future Orders Complete By Expires     Diet - low sodium heart healthy  As directed     Increase activity slowly  As directed         Medication List    TAKE these medications       amLODipine 5 MG tablet  Commonly known as:  NORVASC  Take 1 tablet (5 mg total) by mouth daily.     aspirin 81 MG chewable tablet  Chew 162 mg by mouth daily.     co-enzyme Q-10 30 MG  capsule  Take 30 mg by mouth daily.     fenofibrate 160 MG tablet  Take 160 mg by mouth daily.     Fish Oil 1000 MG Caps  Take 1 capsule by mouth daily.     glyBURIDE 5 MG tablet  Commonly known as:  DIABETA  Take 2 tablets (10 mg total) by mouth 2 (two) times daily.     insulin glargine 100 UNIT/ML injection  Commonly known as:  LANTUS  Inject 0.55 mLs (55 Units total) into the skin at bedtime.     isosorbide mononitrate 60 MG 24 hr tablet  Commonly known as:  IMDUR  TAKE (1/2) TABLET BY MOUTH TWICE DAILY.     loratadine 10 MG tablet  Commonly known as:  CLARITIN  Take 10 mg by mouth at bedtime.     LORazepam 1 MG tablet  Commonly known as:  ATIVAN  Take 1 mg by mouth at bedtime.     losartan 100 MG tablet  Commonly known as:  COZAAR  Take 100 mg by mouth daily.     metFORMIN 500 MG tablet  Commonly known as:  GLUCOPHAGE  Take 2 tablets (1,000 mg total) by mouth 2 (two) times daily with a meal. 2 tabs am 1 tab lunch 2 tabs pm     metoprolol succinate 50 MG 24 hr tablet  Commonly known as:  TOPROL-XL  TAKE (1) TABLET BY MOUTH TWICE DAILY.     multivitamin with minerals tablet  Take 1 tablet by mouth daily.     NITROQUICK 0.4 MG SL tablet  Generic drug:  nitroGLYCERIN  DISSOLVE 1 TABLET UNDER TONGUE EVERY 5 MINUTES UP TO 15 MIN FOR CHESTPAIN. IF NO RELIEF CALL 911.     ranolazine 500 MG 12 hr tablet  Commonly known as:  RANEXA  Take 1 tablet (500 mg total) by mouth 2 (two) times daily.     rOPINIRole 4 MG tablet  Commonly known as:  REQUIP  Take 4 mg by mouth at bedtime.     rosuvastatin 5 MG tablet  Commonly known as:  CRESTOR  Take 1 tablet (5 mg total) by mouth daily.     triamterene-hydrochlorothiazide 37.5-25 MG per tablet  Commonly known as:  MAXZIDE-25  Take 1 tablet by mouth daily.     vitamin B-12 500 MCG tablet  Commonly known as:  CYANOCOBALAMIN  Take 500 mcg by mouth daily.     vitamin C 500 MG tablet  Commonly known as:  ASCORBIC ACID   Take 500 mg by mouth daily.           Follow-up Information   Follow up with Harlow Asa, MD In 4 weeks. (hospital follo wup)    Contact information:   [address] [place] [address] [phone]  The results of significant diagnostics from this hospitalization (including imaging, microbiology, ancillary and laboratory) are listed below for reference.    Significant Diagnostic Studies: Dg Chest 2 View  04/19/2012  *RADIOLOGY REPORT*  Clinical Data: Rule out infiltrate.  CHF.  CHEST - 2 VIEW  Comparison: 03/18/2012  Findings: The heart size and mediastinal contours are within normal limits.  Both lungs are clear. Spondylosis is noted in the thoracic spine.  IMPRESSION: No acute cardiopulmonary abnormalities.   Original Report Authenticated By: Signa Kell, M.D.    Ct Head Wo Contrast  04/19/2012  *RADIOLOGY REPORT*  Clinical Data: Right-sided weakness  CT HEAD WITHOUT CONTRAST  Technique:  Contiguous axial images were obtained from the base of the skull through the vertex without contrast.  Comparison: None.  Findings: The brain has a normal appearance without evidence for hemorrhage, infarction, hydrocephalus, or mass lesion.  There is no extra axial fluid collection.  The skull and paranasal sinuses are normal.  IMPRESSION: Normal exam.   Original Report Authenticated By: Signa Kell, M.D.    Mr Brain Wo Contrast  04/19/2012  *RADIOLOGY REPORT*  Clinical Data: 62 year old male with dizziness and right-sided weakness. Ataxia.  History of diabetes, sleep apnea.  MRI HEAD WITHOUT CONTRAST  Technique:  Multiplanar, multiecho pulse sequences of the brain and surrounding structures were obtained according to standard protocol without intravenous contrast.  Comparison: Head CT 04/19/2012.  Findings: Cerebral volume is within normal limits for age.  No restricted diffusion to suggest acute infarction.  No midline shift, mass effect, evidence of mass lesion,  ventriculomegaly, extra-axial collection or acute intracranial hemorrhage. Cervicomedullary junction and pituitary are within normal limits. Major intracranial vascular flow voids are preserved.  Scattered punctate foci of subcortical white matter T2 and FLAIR hyperintensity in the cerebral hemispheres, are themselves within normal limits for this age.  There may be a small area of linear chronic encephalomalacia in the left frontal operculum on series 6 image 15, uncertain.  This might also represent a developmental venous anomaly.  Normal signal in the deep gray matter nuclei, brainstem and cerebellum.  The right side visible internal auditory structures appear grossly normal.  The left side seventh and eighth cranial nerves are difficult to visualize which might be artifact.  Mastoids are clear.  Negative visualized cervical spine.  Normal bone marrow signal. Visualized orbit soft tissues are within normal limits.  Mild paranasal sinus mucosal thickening.  Negative scalp soft tissues.  IMPRESSION: No acute intracranial abnormality. Small focus of left frontal lobe encephalomalacia versus artifact. Otherwise negative for age noncontrast MRI appearance the brain.   Original Report Authenticated By: Erskine Speed, M.D.     Microbiology: No results found for this or any previous visit (from the past 240 hour(s)).   Labs: Basic Metabolic Panel:  Recent Labs Lab 04/19/12 1142 04/20/12 0650 04/21/12 0615  NA 132* 132* 133*  K 4.1 4.4 4.0  CL 95* 99 97  CO2 23 21 23   GLUCOSE 317* 253* 191*  BUN 21 22 22   CREATININE 1.17 1.26 1.31  CALCIUM 9.8 9.4 10.1   Liver Function Tests:  Recent Labs Lab 04/19/12 1205  AST 23  ALT 30  ALKPHOS 46  BILITOT 0.2*  PROT 7.4  ALBUMIN 4.0   No results found for this basename: LIPASE, AMYLASE,  in the last 168 hours No results found for this basename: AMMONIA,  in the last 168 hours CBC:  Recent Labs Lab 04/19/12 1205 04/20/12 0650  WBC 5.5 5.5  NEUTROABS 2.8  --   HGB 12.5* 12.6*  HCT 36.8* 36.5*  MCV 86.4 85.1  PLT 288 270   Cardiac Enzymes:  Recent Labs Lab 04/19/12 1142 04/19/12 1630 04/19/12 2300 04/20/12 0650  TROPONINI <0.30 <0.30 <0.30 <0.30   BNP: BNP (last 3 results)  Recent Labs  04/19/12 1142  PROBNP 13.3   CBG:  Recent Labs Lab 04/20/12 0725 04/20/12 1127 04/20/12 1623 04/20/12 2131 04/21/12 0723  GLUCAP 246* 287* 280* 216* 199*       Signed:  Marinda Elk  [facility] 04/21/2012, 9:14 AM

## 2012-04-21 NOTE — Progress Notes (Signed)
DC orders received.  Patient stable with no S/S of distress. Medication and discharge information reviewed with wife and patient.  Patient DC home with wife. Paul Baker, Mitzi Hansen

## 2012-04-21 NOTE — Progress Notes (Signed)
RT went to place pt on CPAP for night to find pt had already placed self on.  Pt tolerating well.  RT to monitor as needed.

## 2012-04-22 ENCOUNTER — Encounter: Payer: Self-pay | Admitting: *Deleted

## 2012-04-23 ENCOUNTER — Encounter: Payer: Self-pay | Admitting: Family Medicine

## 2012-04-23 ENCOUNTER — Ambulatory Visit (INDEPENDENT_AMBULATORY_CARE_PROVIDER_SITE_OTHER): Payer: Medicare Other | Admitting: Family Medicine

## 2012-04-23 VITALS — BP 127/80 | Ht 65.0 in | Wt 236.4 lb

## 2012-04-23 DIAGNOSIS — E785 Hyperlipidemia, unspecified: Secondary | ICD-10-CM

## 2012-04-23 DIAGNOSIS — E119 Type 2 diabetes mellitus without complications: Secondary | ICD-10-CM

## 2012-04-23 DIAGNOSIS — I2510 Atherosclerotic heart disease of native coronary artery without angina pectoris: Secondary | ICD-10-CM

## 2012-04-23 NOTE — Progress Notes (Signed)
  Subjective:    Patient ID: Paul Baker, male    DOB: 1950/09/24, 62 y.o.   MRN: [id number]  HPI Patient presents having experienced a fairly difficult week. He developed very high sugars. He proceeded to develop some unilateral numbness. He was seen in emergency room. Had an MRI which was negative. The hospitalist consult in the neurologist who felt his symptoms were likely elevated sugars. In addition the patient experienced some chest symptoms. He had negative cardiac enzymes. Negative telemetry. The cardiology folks recommended adding Ranexa. Patient reports he is feeling much better at this time. He also stopped his lunchtime metformin and increase his Lantus. Patient reports sugars are definitely improved today. Due to see a neurologist. Due to see cardiologist.   Review of Systems    review systems otherwise negative. Objective:   Physical Exam   Alert no acute distress. Vitals reviewed. Lungs clear. Heart rare rhythm. Ankles without edema. Sensation intact. Pulses good.     Assessment & Plan:  Impression #1 type 2 diabetes suboptimum. #2 neurological events felt to be likely secondary to 1. #3 chest pain. Result. #4 coronary artery disease ongoing. #5 hypertension clinically stable. Plan increase Lantus to 38. Skip midday metformin. Followup with multiple specialists as scheduled. WSL followup. 3 months. WSL

## 2012-05-01 ENCOUNTER — Encounter: Payer: Self-pay | Admitting: Neurology

## 2012-05-01 ENCOUNTER — Ambulatory Visit (INDEPENDENT_AMBULATORY_CARE_PROVIDER_SITE_OTHER): Payer: Medicare Other | Admitting: Neurology

## 2012-05-01 DIAGNOSIS — G4733 Obstructive sleep apnea (adult) (pediatric): Secondary | ICD-10-CM

## 2012-05-01 DIAGNOSIS — G4710 Hypersomnia, unspecified: Secondary | ICD-10-CM | POA: Insufficient documentation

## 2012-05-01 NOTE — Progress Notes (Signed)
Paul Baker is an established sleep patient in my clinic who has restless leg syndrome, excessive daytime sleepiness with an Epworth score engorged at 19 points and is so normally followed by Dr. Terrace Arabia .  Last time I saw Paul patient on 03/18/2012 he presented  With his  brought in CPAP machine and has since then received  A replacement  and his past medical history it is positive for morbid obesity ,RLS , persistent hypersomnia on CPAP of 16 cm after being diagnosed with an AHI of 60 and a sleep study dating 06-23-2010.  his last BMI was 40.08.  Paul patient also endorsed Paul epworth sleepiness score in his last visit at 21 points,  and today  at 71.  he may have to return for a PSG with M SLT.  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  I revealed a download from his S9 machine Paul average daily usage for this patient is 5 hours and 3 minutes, documenting compliance by Medicare criteria. AHI is 0.6 his medium pressure at Paul 95% percentile is 14 cm water with  Paul current setting needing adjustment from 16 cm water to 14 cm . Paul patient will be scheduled for a PSG on CPAP followed by an MSLT.  His restless leg component is still bothering him very much and seems to have been present for many years now. Attached is a specific questionnaire  for restless leg syndrome.  Paul patient had an upper respiratory infection for over 10 weeks and was recently hospitalized. He reports he had extremely high blood sugar levels and his clinical presentation resembled that of a stroke patient. He has been stabilized on Lantus insulin.    Provider:  Dr Briley Bumgarner Referring Provider: Merlyn Albert, MD Primary Care Physician:  Harlow Asa, MD  Chief Complaint  Patient presents with  . Follow-up    CPAP,rm 11    HPI:  Paul Baker is a 62 y.o. male here as a referral from Dr. [name] for  persistent daytime sleepiness in a setting of treated obstructive sleep apnea.  Review of Systems: Out  of a complete 14 system review, Paul patient complains of only Paul following symptoms, and all other reviewed systems are negative.: RLS, sleepiness, obesity.    History   Social History  . Marital Status: Married    Spouse Name: N/A    Number of Children: N/A  . Years of Education: N/A   Occupational History  . Disabled   .     Social History Main Topics  . Smoking status: Former Smoker    Types: Cigarettes  . Smokeless tobacco: Never Used  . Alcohol Use: No  . Drug Use: No  . Sexually Active: Not on file   Other Topics Concern  . Not on file   Social History Narrative  . No narrative on file    Family History  Problem Relation Age of Onset  . Hypertension Father   . Heart attack Father   . Diabetes Sister     Past Medical History  Diagnosis Date  . Arthritis   . Type 2 diabetes mellitus   . Mixed hyperlipidemia   . Essential hypertension, benign   . Septic arthritis of knee, left   . OSA (obstructive sleep apnea)     Sleep study 2007  . Coronary atherosclerosis of native coronary artery     Diseased nondominant RCA  . Restless leg syndrome   . Pulmonary embolism 2004  . DVT (  deep venous thrombosis)   . Asthma   . Rotator cuff disorder   . Colon polyp   . Morbid obesity   . OSA on CPAP     Past Surgical History  Procedure Laterality Date  . Total knee arthroplasty    . Lumbar disc surgery      Left L3, L4, L5 discecotomy with decompression of L4 root  . Wrist surgery    . Total knee revision    . Colonoscopy      Current Outpatient Prescriptions  Medication Sig Dispense Refill  . amLODipine (NORVASC) 5 MG tablet Take 1 tablet (5 mg total) by mouth daily.  30 tablet  6  . aspirin 81 MG chewable tablet Chew 162 mg by mouth daily.      [name] co-enzyme Q-10 30 MG capsule Take 30 mg by mouth daily.      . fenofibrate 160 MG tablet Take 160 mg by mouth daily.      [name] glyBURIDE (DIABETA) 5 MG tablet Take 2 tablets (10 mg total) by mouth 2 (two) times  daily.  120 tablet  2  . insulin glargine (LANTUS) 100 UNIT/ML injection Inject 0.55 mLs (55 Units total) into Paul skin at bedtime.  10 mL    . isosorbide mononitrate (IMDUR) 60 MG 24 hr tablet TAKE (1/2) TABLET BY MOUTH TWICE DAILY.  60 tablet  3  . loratadine (CLARITIN) 10 MG tablet Take 10 mg by mouth at bedtime.      [name] LORazepam (ATIVAN) 1 MG tablet Take 1 mg by mouth at bedtime.      [name] losartan (COZAAR) 100 MG tablet Take 100 mg by mouth daily.       . metFORMIN (GLUCOPHAGE) 500 MG tablet Take 2 tablets (1,000 mg total) by mouth 2 (two) times daily with a meal. 2 tabs am 1 tab lunch 2 tabs pm  60 tablet  0  . Multiple Vitamins-Minerals (MULTIVITAMIN WITH MINERALS) tablet Take 1 tablet by mouth daily.        [name] NITROQUICK 0.4 MG SL tablet DISSOLVE 1 TABLET UNDER TONGUE EVERY 5 MINUTES UP TO 15 MIN FOR CHESTPAIN. IF NO RELIEF CALL 911.  25 each  3  . Omega-3 Fatty Acids (FISH OIL) 1000 MG CAPS Take 1 capsule by mouth daily.        . ranolazine (RANEXA) 500 MG 12 hr tablet Take 1 tablet (500 mg total) by mouth 2 (two) times daily.  30 tablet  0  . rOPINIRole (REQUIP) 4 MG tablet Take 4 mg by mouth at bedtime.       . rosuvastatin (CRESTOR) 5 MG tablet Take 1 tablet (5 mg total) by mouth daily.  30 tablet  5  . triamterene-hydrochlorothiazide (MAXZIDE-25) 37.5-25 MG per tablet Take 1 tablet by mouth daily.        . VENTOLIN HFA 108 (90 BASE) MCG/ACT inhaler       . vitamin B-12 (CYANOCOBALAMIN) 500 MCG tablet Take 500 mcg by mouth daily.      . vitamin C (ASCORBIC ACID) 500 MG tablet Take 500 mg by mouth daily.       No current facility-administered medications for this visit.    Allergies as of 05/01/2012 - Review Complete 05/01/2012  Allergen Reaction Noted  . Cardizem (diltiazem hcl)  04/22/2012  . Cardura (doxazosin mesylate)  04/22/2012  . Lipitor (atorvastatin)  04/22/2012  . Lotensin (benazepril hcl)  04/22/2012  . Paxil (paroxetine hcl)  04/22/2012  . Codeine Rash  and Other (See  Comments) 11/30/2009  . Metoprolol Rash 05/01/2012    Vitals: BP 129/77  Pulse 77  Temp(Src) 97.6 F (36.4 C)  Ht 5\' 5"  (1.651 m)  Wt 244 lb (110.678 kg)  BMI 40.6 kg/m2 Last Weight:  Wt Readings from Last 1 Encounters:  05/01/12 244 lb (110.678 kg)   Last Height:   Ht Readings from Last 1 Encounters:  05/01/12 5\' 5"  (1.651 m)   General: Paul patient is awake, alert and appears not in acute distress. Paul patient is well groomed. Head: Normocephalic, atraumatic. Neck is supple. Mallampati 3, neck circumference:18. Cardiovascular:  Regular rate and rhythm, without  murmurs or carotid bruit, and without distended neck veins. Respiratory: Lungs are clear to auscultation. Skin:  Without evidence of edema, or rash Trunk: BMI is elevated and patient  has normal posture.  Neurologic exam : Paul patient is awake and alert, oriented to place and time.  Memory subjective  described as intact. There is a normal attention span & concentration ability. Speech is fluent without  dysarthria, dysphonia or aphasia. Mood and affect are appropriate.  Cranial nerves: Pupils are equal and briskly reactive to light. Funduscopic exam without  evidence of pallor or edema. Extraocular movements  in vertical and horizontal planes intact and without nystagmus. Visual fields by finger perimetry are intact. Hearing to finger rub intact.  Facial sensation intact to fine touch. Facial motor strength is symmetric and tongue and uvula move midline.  Motor exam:   Normal tone and normal muscle bulk and symmetric normal strength in all extremities.  Sensory:  Fine touch, pinprick and vibration were tested in all extremities. Proprioception is tested in Paul upper extremities only. This was  normal.  Coordination: Rapid alternating movements in Paul fingers/hands is tested and normal. Finger-to-nose maneuver tested and normal without evidence of ataxia, dysmetria or tremor.  Gait and station: Patient walks without  assistive device and is able and assisted stool climb up to Paul exam table. Strength within normal limits. Stance is stable and normal.  Deep tendon reflexes: in Paul  upper and lower extremities are symmetric and intact.  Assessment:  After physical and neurologic examination, review of laboratory studies, imaging, neurophysiology testing and pre-existing records, assessment will be reviewed on Paul problem list.  Plan:  Treatment plan and additional workup will be reviewed under Problem List.

## 2012-05-01 NOTE — Patient Instructions (Signed)
1200 Calorie Diabetic Diet The 1200 calorie diabetic diet limits calories to 1200 each day. Following this diet and making healthy meal choices can help improve overall health. It controls blood glucose (sugar) levels and can also help lower blood pressure and cholesterol.  SERVING SIZES Measuring foods and serving sizes helps to make sure you are getting the right amount of food. The list below tells how big or small some common serving sizes are.   1 oz.........4 stacked dice.  3 oz.........Deck of cards.  1 tsp........Tip of little finger.  1 tbs........Thumb.  2 tbs........Golf ball.   cup.......Half of a fist.  1 cup........A fist. GUIDELINES FOR CHOOSING FOODS The goal of this diet is to eat a variety of foods and limit calories to 1200 each day. This can be done by choosing foods that are low in calories and fat. The diet also suggests eating small amounts of food frequently. Doing this helps control your blood glucose levels, so they do not get too high or too low. Each meal or snack may include a protein food source to help you feel more satisfied. Try to eat about the same amount of food around the same time each day. This includes weekend days, travel days, and days off work. Space your meals about 4 to 5 hours apart, and add a snack between them, if you wish.  For example, a daily food plan could include breakfast, a morning snack, lunch, dinner, and an evening snack. Healthy meals and snacks have different types of foods, including whole grains, vegetables, fruits, lean meats, poultry, fish, and dairy products. As you plan your meals, select a variety of foods. Choose from the bread and starch, vegetable, fruit, dairy, and meat/protein groups. Examples of foods from each group are listed below, with their suggested serving sizes. Use measuring cups and spoons to become familiar with what a healthy portion looks like. Bread and Starch Each serving equals 15 grams of  carbohydrate.  1 slice bread.   bagel.   cup cold cereal (unsweetened).   cup hot cereal or mashed potatoes.  1 small potato (size of a computer mouse).   cup cooked pasta or rice.   English muffin.  1 cup broth-based soup.  3 cups of popcorn.  4 to 6 whole-wheat crackers.   cup cooked beans, peas, or corn. Vegetables Each serving equals 5 grams of carbohydrate.   cup cooked vegetables.  1 cup raw vegetables.   cup tomato or vegetable juice. Fruit Each serving equals 15 grams of carbohydrate.  1 small apple or orange.  1  cup watermelon or strawberries.   cup applesauce (no sugar added).  2 tbs raisins.   banana.   cup canned fruit, packed in water or in its own juice.   cup unsweetened fruit juice. Dairy Each serving equals 12 to 15 grams of carbohydrate.  1 cup fat-free milk.  6 oz artificially sweetened yogurt or plain yogurt.  1 cup low-fat buttermilk.  1 cup soy milk.  1 cup almond milk. Meat/Protein  1 large egg.  2 to 3 oz meat, poultry, or fish.   cup low-fat cottage cheese.  1 tbs peanut butter.  1 oz low-fat cheese.   cup tuna, packed in water.   cup tofu. Fat  1 tsp oil.  1 tsp trans-fat-free margarine.  1 tsp butter.  1 tsp mayonnaise.  2 tbs avocado.  1 tbs salad dressing.  1 tbs cream cheese.  2 tbs sour cream. SAMPLE 1200 CALORIE   DIET PLAN Breakfast  1 cup fat-free milk (1 carb serving).  1 small orange (1 carb serving).  1 scrambled egg.  1 slice whole-wheat toast (1 carb serving). Lunch  Sandwich  2 slices whole-wheat bread (2 carb servings).  2 oz lean meat.  2 tsp reduced fat mayonnaise.  1 lettuce leaf.  2 slices tomato.  1 cup carrot sticks. Afternoon Snack  1 small apple (1 carb serving).  1 string cheese. Dinner  2 oz meat.  1 small baked potato (1 carb serving).  1 tsp trans-fat-free margarine.  1 cup steamed broccoli.  1 cup fat-free milk (1 carb  serving). Evening Snack   small banana (1 carb serving).  6 vanilla wafers (1 carb serving). MEAL PLAN You can use this worksheet to help you make a daily meal plan based on the 1200 calorie diabetic diet suggestions. If you are using this plan to help you control your blood glucose, you may interchange carbohydrate containing foods (dairy, starches, and fruits). Select a variety of fresh foods of varying colors and flavors. The total amount of carbohydrate in your meals or snacks is more important than making sure you include all of the food groups every time you eat. You can choose from approximately this many of the following foods to build your day's meals:  6 Starches.  3 Vegetables.  2 Fruits.  2 Dairy.  4 to 6 oz Meat/Protein.  Up to 3 Fats. Your dietician can use this worksheet to help you decide how many servings and which types of foods are right for you. BREAKFAST Food Group and Servings / Food Choice Starch _________________________________________________________ Dairy __________________________________________________________ Fruit ___________________________________________________________ Meat/Protein____________________________________________________ Fat ____________________________________________________________ LUNCH Food Group and Servings / Food Choice  Starch _________________________________________________________ Meat/Protein ___________________________________________________ Vegetables _____________________________________________________ Fruit __________________________________________________________ Dairy __________________________________________________________ Fat ____________________________________________________________ AFTERNOON SNACK Food Group and Servings / Food Choice Dairy __________________________________________________________ Fruit ___________________________________________________________ Starch  __________________________________________________________ Meat/Protein____________________________________________________ DINNER Food Group and Servings / Food Choice Starch _________________________________________________________ Meat/Protein ___________________________________________________ Dairy __________________________________________________________ Vegetables _____________________________________________________ Fruit __________________________________________________________ Fat ____________________________________________________________ EVENING SNACK Food Group and Servings / Food Choice Fruit ___________________________________________________________ Meat/Protein ____________________________________________________ Dairy __________________________________________________________ Starch __________________________________________________________ DAILY TOTALS Starches _________________________ Vegetables _______________________ Fruits ____________________________ Dairy ____________________________ Meat/Protein______________________ Fats _____________________________ Document Released: 07/11/2004 Document Revised: 03/13/2011 Document Reviewed: 11/05/2008 Exit[employer] Patient Information 2013 [employer]. Exercise to Lose Weight Exercise and a healthy diet may help you lose weight. Your doctor may suggest specific exercises. EXERCISE IDEAS AND TIPS  Choose low-cost things you enjoy doing, such as walking, bicycling, or exercising to workout videos.  Take stairs instead of the elevator.  Walk during your lunch break.  Park your car further away from work or school.  Go to a gym or an exercise class.  Start with 5 to 10 minutes of exercise each day. Build up to 30 minutes of exercise 4 to 6 days a week.  Wear shoes with good support and comfortable clothes.  Stretch before and after working out.  Work out until you breathe harder and your heart beats  faster.  Drink extra water when you exercise.  Do not do so much that you hurt yourself, feel dizzy, or get very short of breath. Exercises that burn about 150 calories:  Running 1  miles in 15 minutes.  Playing volleyball for 45 to 60 minutes.  Washing and waxing a car for 45 to 60 minutes.  Playing touch football for 45 minutes.  Walking 1  miles in 35 minutes.  Pushing a stroller 1  miles in 30 minutes.    Playing basketball for 30 minutes.  Raking leaves for 30 minutes.  Bicycling 5 miles in 30 minutes.  Walking 2 miles in 30 minutes.  Dancing for 30 minutes.  Shoveling snow for 15 minutes.  Swimming laps for 20 minutes.  Walking up stairs for 15 minutes.  Bicycling 4 miles in 15 minutes.  Gardening for 30 to 45 minutes.  Jumping rope for 15 minutes.  Washing windows or floors for 45 to 60 minutes. Document Released: 01/21/2010 Document Revised: 03/13/2011 Document Reviewed: 01/21/2010 Exit[employer] Patient Information 2013 [employer].  

## 2012-05-08 ENCOUNTER — Encounter: Payer: Self-pay | Admitting: Neurology

## 2012-05-09 ENCOUNTER — Ambulatory Visit (INDEPENDENT_AMBULATORY_CARE_PROVIDER_SITE_OTHER): Payer: Medicare Other | Admitting: Neurology

## 2012-05-09 VITALS — BP 121/62

## 2012-05-09 DIAGNOSIS — G2581 Restless legs syndrome: Secondary | ICD-10-CM

## 2012-05-09 DIAGNOSIS — G4713 Recurrent hypersomnia: Secondary | ICD-10-CM

## 2012-05-09 DIAGNOSIS — G4733 Obstructive sleep apnea (adult) (pediatric): Secondary | ICD-10-CM

## 2012-05-09 DIAGNOSIS — G4761 Periodic limb movement disorder: Secondary | ICD-10-CM

## 2012-05-10 DIAGNOSIS — G4713 Recurrent hypersomnia: Secondary | ICD-10-CM

## 2012-05-10 DIAGNOSIS — G4761 Periodic limb movement disorder: Secondary | ICD-10-CM

## 2012-05-10 DIAGNOSIS — G4733 Obstructive sleep apnea (adult) (pediatric): Secondary | ICD-10-CM

## 2012-05-10 DIAGNOSIS — G2581 Restless legs syndrome: Secondary | ICD-10-CM

## 2012-05-16 ENCOUNTER — Other Ambulatory Visit: Payer: Self-pay | Admitting: Family Medicine

## 2012-05-23 ENCOUNTER — Other Ambulatory Visit: Payer: Self-pay | Admitting: Family Medicine

## 2012-05-23 ENCOUNTER — Telehealth: Payer: Self-pay | Admitting: *Deleted

## 2012-05-23 NOTE — Telephone Encounter (Signed)
Called patient to discuss sleep study results from  05/09/2012 and 05/10/2012.  Discussed findings, recommendations and follow up care.  Patient understood well and all questions were answered.  Scheduled sleep consult follow up for Friday 05/31/2012 at 9 AM with Dr. Vickey Huger.  Pt was notified to bring CPAP machine and arrive 30 minutes early. -sh

## 2012-05-24 ENCOUNTER — Other Ambulatory Visit: Payer: Self-pay | Admitting: *Deleted

## 2012-05-24 DIAGNOSIS — G4733 Obstructive sleep apnea (adult) (pediatric): Secondary | ICD-10-CM

## 2012-05-28 ENCOUNTER — Encounter: Payer: Self-pay | Admitting: Family Medicine

## 2012-05-28 ENCOUNTER — Ambulatory Visit (INDEPENDENT_AMBULATORY_CARE_PROVIDER_SITE_OTHER): Payer: Medicare Other | Admitting: Family Medicine

## 2012-05-28 VITALS — BP 132/82 | Wt 245.0 lb

## 2012-05-28 DIAGNOSIS — E119 Type 2 diabetes mellitus without complications: Secondary | ICD-10-CM

## 2012-05-28 NOTE — Progress Notes (Signed)
  Subjective:    Patient ID: Paul Baker, male    DOB: 1950/05/10, 62 y.o.   MRN: [id number]  Diabetes He presents for his follow-up diabetic visit. He has type 2 diabetes mellitus. His disease course has been improving. Pertinent negatives for hypoglycemia include no confusion or dizziness. Symptoms are improving.   Patient notes overall his diabetes control is improving. However, the Lantus is extremely costly. In addition the Lantus pens are even cost we are. Patient requests a trial of Lantus syringe and vial.   Review of Systems  Neurological: Negative for dizziness.  Psychiatric/Behavioral: Negative for confusion.       Objective:   Physical Exam  Alert no acute distress. HEENT normal. Lungs clear. Heart regular in rhythm. Vitals reviewed. Alert no acute distress      Assessment & Plan:  Impression #1 type 2 diabetes with inability to afford high cost insulin. Discussed at length. Plan trial of syringe and vial of Lantus. This does not work, we'll try generic NPH, which tends to still be fairly costly. WSL

## 2012-05-29 ENCOUNTER — Other Ambulatory Visit: Payer: Self-pay | Admitting: Family Medicine

## 2012-05-30 ENCOUNTER — Encounter: Payer: Self-pay | Admitting: Neurology

## 2012-05-31 ENCOUNTER — Encounter: Payer: Self-pay | Admitting: Neurology

## 2012-05-31 ENCOUNTER — Ambulatory Visit (INDEPENDENT_AMBULATORY_CARE_PROVIDER_SITE_OTHER): Payer: Medicare Other | Admitting: Neurology

## 2012-05-31 VITALS — BP 122/67 | HR 76 | Temp 97.5°F | Ht 65.0 in | Wt 246.0 lb

## 2012-05-31 DIAGNOSIS — I739 Peripheral vascular disease, unspecified: Secondary | ICD-10-CM

## 2012-05-31 DIAGNOSIS — G4710 Hypersomnia, unspecified: Secondary | ICD-10-CM

## 2012-05-31 DIAGNOSIS — G2581 Restless legs syndrome: Secondary | ICD-10-CM

## 2012-05-31 DIAGNOSIS — G4733 Obstructive sleep apnea (adult) (pediatric): Secondary | ICD-10-CM

## 2012-05-31 DIAGNOSIS — D638 Anemia in other chronic diseases classified elsewhere: Secondary | ICD-10-CM

## 2012-05-31 MED ORDER — ASCRIPTIN 325 MG PO TABS: 325.0000 mg | ORAL_TABLET | Freq: Every morning | ORAL | Status: DC

## 2012-05-31 MED ORDER — ARMODAFINIL 50 MG PO TABS: 100.0000 mg | ORAL_TABLET | Freq: Every day | ORAL | Status: DC

## 2012-05-31 MED ORDER — NEUPRO 1 MG/24HR TD PT24: 1.0000 mg | MEDICATED_PATCH | Freq: Once | TRANSDERMAL | Status: DC

## 2012-05-31 NOTE — Assessment & Plan Note (Signed)
Patient's underwent a total titration and does best on 15 cm water is in the residual AHI of 0.3. May 2014. 100  Percent compliant

## 2012-05-31 NOTE — Progress Notes (Addendum)
The patient returns today after a visit in early May 2014 for a revisit. He has been using a PEEP is variable pressure settings. I reviewed his CPAP a little titration from the date 04/05/2012 through 05/04/2012. His 95th percentile pressure is 14 cm, his AHI is 0.6. I consider his apnea optimally treated. Average daily usages 5 hours and 20 minutes for the last 30 days he would be 100% compliant by Medicare criteria.  Epworth sleepiness score of was endorsed still at 20 points this continues while using CPAP.  The patient, who also has restless leg syndrome, endorsed a moderate severity of restless left leg symptoms at night which will which could contribute to his residual high degree of sleepiness .Please review the attached study reports in Epic.      patient with severe obstructive sleep apnea followed by Dr. Terrace Arabia. AHI 62 titrated to 16 cm water. DME was [employer] ,  PCP Dr.-Luking.  The patient also reported sleep hallucinations at the time and began to have more  severe restless leg syndrome  While he continued on high-dose  Requip.  Apnea cannot longer be the cause for his excessive daytime sleepiness, I will try to adjust her medication treatment for restless leg syndrome.  In addition given the clinical presentation of associated vivid dreams and some sleep hallucinations and dream  intrusions, it was  advisable to perform MS LT to evaluate for narcolepsy or related sleep disorder is causing the persistent hypersomnia. His MSL T. date at 5-9 2014 showed a mean sleep latency of 2.3 minutes REM latency 8.5 minutes is 1 sleep onset REM. Total sleep time preceding this test 349 minutes sleep was severely interrupted by periodic limb movements.  Based on these results the patient will change from Requip to a Nupro - patch and I would prescribe a stimulant to help him stay awake and daytime. This patient has an irregular heart beat and coronary artery disease. Pending his cardiologist to  agree or disagree with a medication, I suggested Nuvigil. Obtain an anemia panel to complete has review of metabolic factors contributing to restless leg syndrome. The patient is aware that his risk factors include thyroid disease, diabetic neuropathy, but also had worsening of restless legs after a knee surgery possibly due to blood loss.   Last visit:  The [place] is an established sleep patient in my clinic who has restless leg syndrome, excessive daytime sleepiness with an Epworth score stays high at  19-20 points . He is   followed by Dr. Terrace Arabia .  Last time I saw the patient on 03/18/2012 he presented  his  Broken , old CPAP machine and has since then received a replacement  And was put on autotitration.  and his past medical history it is positive for morbid obesity ,RLS , persistent hypersomnia on CPAP of 16 cm after being diagnosed with an AHI of 60 and a sleep study dating 06-23-2010.  his last BMI was 40.08.

## 2012-05-31 NOTE — Patient Instructions (Signed)
Restless Legs Syndrome Restless legs syndrome is a movement disorder. It may also be called a sensori-motor disorder.  CAUSES  No one knows what specifically causes restless legs syndrome, but it tends to run in families. It is also more common in people with low iron, in pregnancy, in people who need dialysis, and those with nerve damage (neuropathy).Some medications may make restless legs syndrome worse.Those medications include drugs to treat high blood pressure, some heart conditions, nausea, colds, allergies, and depression. SYMPTOMS Symptoms include uncomfortable sensations in the legs. These leg sensations are worse during periods of inactivity or rest. They are also worse while sitting or lying down. Individuals that have the disorder describe sensations in the legs that feel like:  Pulling.  Drawing.  Crawling.  Worming.  Boring.  Tingling.  Pins and needles.  Prickling.  Pain. The sensations are usually accompanied by an overwhelming urge to move the legs. Sudden muscle jerks may also occur. Movement provides temporary relief from the discomfort. In rare cases, the arms may also be affected. Symptoms may interfere with going to sleep (sleep onset insomnia). Restless legs syndrome may also be related to periodic limb movement disorder (PLMD). PLMD is another more common motor disorder. It also causes interrupted sleep. The symptoms from PLMD usually occur most often when you are awake. TREATMENT  Treatment for restless legs syndrome is symptomatic. This means that the symptoms are treated.   Massage and cold compresses may provide temporary relief.  Walk, stretch, or take a cold or hot bath.  Get regular exercise and a good night's sleep.  Avoid caffeine, alcohol, nicotine, and medications that can make it worse.  Do activities that provide mental stimulation like discussions, needlework, and video games. These may be helpful if you are not able to walk or  stretch. Some medications are effective in relieving the symptoms. However, many of these medications have side effects. Ask your caregiver about medications that may help your symptoms. Correcting iron deficiency may improve symptoms for some patients. Document Released: 12/09/2001 Document Revised: 03/13/2011 Document Reviewed: 03/17/2010 [facility] Patient Information 2014 [place]. Sleep Apnea  Sleep apnea is a sleep disorder characterized by abnormal pauses in breathing while you sleep. When your breathing pauses, the level of oxygen in your blood decreases. This causes you to move out of deep sleep and into light sleep. As a result, your quality of sleep is poor, and the system that carries your blood throughout your body (cardiovascular system) experiences stress. If sleep apnea remains untreated, the following conditions can develop:  High blood pressure (hypertension).  Coronary artery disease.  Inability to achieve or maintain an erection (impotence).  Impairment of your thought process (cognitive dysfunction). There are three types of sleep apnea: 1. Obstructive sleep apnea Pauses in breathing during sleep because of a blocked airway. 2. Central sleep apnea Pauses in breathing during sleep because the area of the brain that controls your breathing does not send the correct signals to the muscles that control breathing. 3. Mixed sleep apnea A combination of both obstructive and central sleep apnea. RISK FACTORS The following risk factors can increase your risk of developing sleep apnea:  Being overweight.  Smoking.  Having narrow passages in your nose and throat.  Being of older age.  Being male.  Alcohol use.  Sedative and tranquilizer use.  Ethnicity. Among individuals younger than 35 years, African Americans are at increased risk of sleep apnea. SYMPTOMS   Difficulty staying asleep.  Daytime sleepiness and fatigue.  Loss of energy.  Irritability.  Loud,  heavy snoring.  Morning headaches.  Trouble concentrating.  Forgetfulness.  Decreased interest in sex. DIAGNOSIS  In order to diagnose sleep apnea, your caregiver will perform a physical examination. Your caregiver may suggest that you take a home sleep test. Your caregiver may also recommend that you spend the night in a sleep lab. In the sleep lab, several monitors record information about your heart, lungs, and brain while you sleep. Your leg and arm movements and blood oxygen level are also recorded. TREATMENT The following actions may help to resolve mild sleep apnea:  Sleeping on your side.   Using a decongestant if you have nasal congestion.   Avoiding the use of depressants, including alcohol, sedatives, and narcotics.   Losing weight and modifying your diet if you are overweight. There also are devices and treatments to help open your airway:  Oral appliances. These are custom-made mouthpieces that shift your lower jaw forward and slightly open your bite. This opens your airway.  Devices that create positive airway pressure. This positive pressure "splints" your airway open to help you breathe better during sleep. The following devices create positive airway pressure:  Continuous positive airway pressure (CPAP) device. The CPAP device creates a continuous level of air pressure with an air pump. The air is delivered to your airway through a mask while you sleep. This continuous pressure keeps your airway open.  Nasal expiratory positive airway pressure (EPAP) device. The EPAP device creates positive air pressure as you exhale. The device consists of single-use valves, which are inserted into each nostril and held in place by adhesive. The valves create very little resistance when you inhale but create much more resistance when you exhale. That increased resistance creates the positive airway pressure. This positive pressure while you exhale keeps your airway open, making it  easier to breath when you inhale again.  Bilevel positive airway pressure (BPAP) device. The BPAP device is used mainly in patients with central sleep apnea. This device is similar to the CPAP device because it also uses an air pump to deliver continuous air pressure through a mask. However, with the BPAP machine, the pressure is set at two different levels. The pressure when you exhale is lower than the pressure when you inhale.  Surgery. Typically, surgery is only done if you cannot comply with less invasive treatments or if the less invasive treatments do not improve your condition. Surgery involves removing excess tissue in your airway to create a wider passage way. Document Released: 12/09/2001 Document Revised: 06/20/2011 Document Reviewed: 04/27/2011 [facility] Patient Information 2014 [place], [place]. Hypersomnia Hypersomnia usually brings recurrent episodes of excessive daytime sleepiness or prolonged nighttime sleep. It is different than feeling tired due to lack of or interrupted sleep at night. People with hypersomnia are compelled to nap repeatedly during the day. This is often at inappropriate times such as:  At work.  During a meal.  In conversation. These daytime naps usually provide no relief. This disorder typically affects adolescents and young adults. CAUSES  This condition may be caused by:  Another sleep disorder (such as narcolepsy or sleep apnea).  Dysfunction of the autonomic nervous system.  Drug or alcohol abuse.  A physical problem, such as:  A tumor.  Head trauma. This is damage caused by an accident.  Injury to the central nervous system.  Certain medications, or medicine withdrawal.  Medical conditions may contribute to the disorder, including:  Multiple sclerosis.  Depression.  Encephalitis.  Epilepsy.  Obesity.  Some people appear to have a genetic predisposition to this disorder. In others, there is no known cause. SYMPTOMS   Patients  often have difficulty waking from a long sleep. They may feel dazed or confused.  Other symptoms may include:  Anxiety.  Increased irritation (inflammation).  Decreased energy.  Restlessness.  Slow thinking.  Slow speech.  Loss of appetite.  Hallucinations.  Memory difficulty.  Tremors, Tics.  Some patients lose the ability to function in family, social, occupational, or other settings. TREATMENT  Treatment is symptomatic in nature. Stimulants and other drugs may be used to treat this disorder. Changes in behavior may help. For example, avoid night work and social activities that delay bed time. Changes in diet may offer some relief. Patients should avoid alcohol and caffeine. PROGNOSIS  The likely outcome (prognosis) for persons with hypersomnia depends on the cause of the disorder. The disorder itself is not life threatening. But it can have serious consequences. For example, automobile accidents can be caused by falling asleep while driving. The attacks usually continue indefinitely. Document Released: 12/09/2001 Document Revised: 03/13/2011 Document Reviewed: 11/13/2007 [facility] Patient Information 2014 [place], [place].

## 2012-05-31 NOTE — Assessment & Plan Note (Signed)
Abnormal MS LT 09-06-12 with a mean sleep latency and 2.3 minutes and one sleep REM onset 8.6 minutes.

## 2012-05-31 NOTE — Addendum Note (Signed)
Addended by: Melvyn Novas on: 05/31/2012 02:19 PM   Modules accepted: Orders

## 2012-06-01 LAB — IRON AND TIBC: TIBC: 384 ug/dL (ref 250–450)

## 2012-06-03 ENCOUNTER — Encounter: Payer: Self-pay | Admitting: Cardiology

## 2012-06-03 ENCOUNTER — Ambulatory Visit (INDEPENDENT_AMBULATORY_CARE_PROVIDER_SITE_OTHER): Payer: Medicare Other | Admitting: Cardiology

## 2012-06-03 VITALS — BP 134/80 | HR 82 | Ht 65.0 in | Wt 241.0 lb

## 2012-06-03 DIAGNOSIS — E785 Hyperlipidemia, unspecified: Secondary | ICD-10-CM

## 2012-06-03 DIAGNOSIS — I10 Essential (primary) hypertension: Secondary | ICD-10-CM

## 2012-06-03 DIAGNOSIS — I2510 Atherosclerotic heart disease of native coronary artery without angina pectoris: Secondary | ICD-10-CM

## 2012-06-03 MED ORDER — RANOLAZINE ER 500 MG PO TB12: 500.0000 mg | ORAL_TABLET | Freq: Two times a day (BID) | ORAL | Status: DC

## 2012-06-03 NOTE — Progress Notes (Signed)
Quick Note:  Normal labs. Send patient a copy, please.CD ______

## 2012-06-03 NOTE — Assessment & Plan Note (Signed)
At this point symptomatically stable. We continue to manage him medically with diseased small nondominant RCA, not good candidate for PCI. Ranexa was added most recently to his regimen and has led to an improvement in symptoms. I do see mention of possibly considering a followup Myoview based on the hospital records, however at this point we will continue medical therapy and observation as I am not sure that this testing will help to further change our treatment strategy. As noted above, use of Nuvigil is contraindicated with concurrent use of Ranexa. I explained this to the patient.

## 2012-06-03 NOTE — Progress Notes (Signed)
Clinical Summary Mr. Bonn is a 62 y.o.male last seen in November 2013. Records reviewed finding hospitalization in April at which time Ranexa was added for further management of angina. He states that he has had improvement in symptoms, has also been focusing a lot on better controlling his glucose.  He has been managed medically for CAD. RCA is quite small and diffusely diseased, not amenable to intervention. He reports compliance with his medications.  He continues to see Dr. Vickey Huger. Nuvigil was suggested as a possible treatment, however this medicine is contraindicated with current use of Ranexa. I discussed this with the patient today.   Allergies  Allergen Reactions  . Cardizem (Diltiazem Hcl)     Edema   . Cardura (Doxazosin Mesylate)     Side effects  . Lipitor (Atorvastatin)   . Lotensin (Benazepril Hcl)     Side effects   . Paxil (Paroxetine Hcl)     Side effects   . Codeine Rash and Other (See Comments)    Headache   . Metoprolol Rash    Current Outpatient Prescriptions  Medication Sig Dispense Refill  . amLODipine (NORVASC) 5 MG tablet Take 2.5 mg by mouth daily.      . Aspirin Buf,AlHyd-MgHyd-CaCar, (ASCRIPTIN) 325 MG TABS Take 325 mg by mouth every morning.  360 each  0  . co-enzyme Q-10 30 MG capsule Take 30 mg by mouth daily.      . fenofibrate 160 MG tablet TAKE (1) TABLET BY MOUTH AT BEDTIME.  30 tablet  2  . glyBURIDE (DIABETA) 5 MG tablet Take 2 tablets (10 mg total) by mouth 2 (two) times daily.  120 tablet  2  . insulin glargine (LANTUS) 100 UNIT/ML injection Inject 0.55 mLs (55 Units total) into the skin at bedtime.  10 mL    . isosorbide mononitrate (IMDUR) 60 MG 24 hr tablet TAKE (1/2) TABLET BY MOUTH TWICE DAILY.  60 tablet  3  . loratadine (CLARITIN) 10 MG tablet Take 10 mg by mouth at bedtime.      [name] LORazepam (ATIVAN) 1 MG tablet Take 1 mg by mouth at bedtime.      [name] losartan (COZAAR) 100 MG tablet TAKE ONE TABLET DAILY.  90 tablet  1  .  metFORMIN (GLUCOPHAGE) 500 MG tablet Take 1,000 mg by mouth 2 (two) times daily with a meal.      . metoprolol succinate (TOPROL-XL) 50 MG 24 hr tablet Take 50 mg by mouth. Twice daily      . Multiple Vitamins-Minerals (MULTIVITAMIN WITH MINERALS) tablet Take 1 tablet by mouth daily.        [name] NITROQUICK 0.4 MG SL tablet DISSOLVE 1 TABLET UNDER TONGUE EVERY 5 MINUTES UP TO 15 MIN FOR CHESTPAIN. IF NO RELIEF CALL 911.  25 each  3  . Omega-3 Fatty Acids (FISH OIL) 1000 MG CAPS Take 1 capsule by mouth daily.        . ranolazine (RANEXA) 500 MG 12 hr tablet Take 1 tablet (500 mg total) by mouth 2 (two) times daily.  60 tablet  5  . rOPINIRole (REQUIP) 4 MG tablet Take 4 mg by mouth at bedtime.       . rosuvastatin (CRESTOR) 5 MG tablet Take 1 tablet (5 mg total) by mouth daily.  30 tablet  5  . triamterene-hydrochlorothiazide (MAXZIDE-25) 37.5-25 MG per tablet TAKE ONE TABLET DAILY.  90 tablet  1  . VENTOLIN HFA 108 (90 BASE) MCG/ACT inhaler Inhale 1 puff into  the lungs as needed.       . vitamin B-12 (CYANOCOBALAMIN) 500 MCG tablet Take 500 mcg by mouth daily.      . vitamin C (ASCORBIC ACID) 500 MG tablet Take 500 mg by mouth daily.       No current facility-administered medications for this visit.    Past Medical History  Diagnosis Date  . Arthritis   . Type 2 diabetes mellitus   . Mixed hyperlipidemia   . Essential hypertension, benign   . Septic arthritis of knee, left   . OSA (obstructive sleep apnea)     Sleep study 2007  . Coronary atherosclerosis of native coronary artery     Diseased nondominant RCA  . Restless leg syndrome   . Pulmonary embolism 2004  . DVT (deep venous thrombosis)   . Asthma   . Rotator cuff disorder   . Colon polyp   . Morbid obesity   . OSA on CPAP   . RLS (restless legs syndrome)   . Hypersomnia     on CPAP of 16 cm, diagnosed with AHI of 60 in 2012,epworth 21- narcolepsy?  . Cataplexy   . High cholesterol     Social History Mr. Poplaski reports  that he quit smoking about 17 years ago. His smoking use included Cigarettes. He smoked 0.00 packs per day. He has never used smokeless tobacco. Mr. Kronk reports that he does not drink alcohol.  Review of Systems Has not had regular use of nitroglycerin recently. No palpitations or syncope. Symptomatically restless leg syndrome. No frequent urination. No fevers or chills. Otherwise negative.  Physical Examination Filed Vitals:   06/03/12 1323  BP: 134/80  Pulse: 82   Filed Weights   06/03/12 1323  Weight: 241 lb (109.317 kg)    Obese male in no acute distress without active chest pain.  HEENT: Conjunctiva and lids normal, oropharynx with moist mucosa.  Neck: Supple, no elevated JVP or carotid bruits, no thyromegaly.  Lungs: Clear to auscultation, nonlabored, no wheezing.  Cardiac: Regular rate and rhythm, no S3 gallop or pericardial rub, no significant systolic murmur.  Abdomen: Soft, nontender, bowel sounds present.  Extremities: Trace to 1+ edema below the knees bilaterally, distal pulses one plus.  Musculoskeletal: No gross deformities.    Problem List and Plan   CORONARY ATHEROSCLEROSIS NATIVE CORONARY ARTERY At this point symptomatically stable. We continue to manage him medically with diseased small nondominant RCA, not good candidate for PCI. Ranexa was added most recently to his regimen and has led to an improvement in symptoms. I do see mention of possibly considering a followup Myoview based on the hospital records, however at this point we will continue medical therapy and observation as I am not sure that this testing will help to further change our treatment strategy. As noted above, use of Nuvigil is contraindicated with concurrent use of Ranexa. I explained this to the patient.  Essential hypertension, benign No change to current regimen.  HYPERLIPIDEMIA Continues on Crestor. Keep followup with Dr.Luking.    Jonelle Sidle, M.D., F.A.C.C.

## 2012-06-03 NOTE — Assessment & Plan Note (Signed)
No change to current regimen. 

## 2012-06-03 NOTE — Patient Instructions (Addendum)
Your physician recommends that you schedule a follow-up appointment in: 4 MONTH

## 2012-06-03 NOTE — Assessment & Plan Note (Signed)
Continues on Crestor. Keep followup with Dr.Luking.

## 2012-06-04 ENCOUNTER — Telehealth: Payer: Self-pay | Admitting: Neurology

## 2012-06-04 NOTE — Telephone Encounter (Signed)
I called and spoke with the patient concerning his message. Patient stated that Nupro patch only last for eight hours and he needs something to last longer. Patient also stated that per his Cardiologist he is unable to take Nuvigil because of the interaction with Ranexa. Patient would like to speak with physician concerning his medication issues.

## 2012-06-04 NOTE — Telephone Encounter (Signed)
Patient's nephrologist is concerned about nuvigil interacting with Ranexa , And asked the patient to not take the medication.  The Nuprol patch was given at the lowest available dose, and i will place samples for 2 and 4 mg up front for him to try. Goal is to achieve a 24 hour reduction of severe restless legs. Patient was informed by phone and agreed with this plan

## 2012-06-12 ENCOUNTER — Encounter: Payer: Self-pay | Admitting: Neurology

## 2012-06-12 ENCOUNTER — Telehealth: Payer: Self-pay | Admitting: Neurology

## 2012-06-12 NOTE — Telephone Encounter (Signed)
Please call patient and have him take the patch BID - 12 hours apart . i will still give samples to front desk. Jakobi Thetford, MD

## 2012-06-12 NOTE — Telephone Encounter (Signed)
Patient states that the Neupro patch he has isn't strong enough and is effective for only 8 hours.  Also his doctor said he can't take the nuvigil because it will react with the Ranexa he is taking, so he has stopped the Nuvigil.  He can be reached at [phone].

## 2012-06-13 NOTE — Telephone Encounter (Signed)
I spoke with patient and told him to take the patch BID, 12 hours apart, per Dr. Vickey Huger.  Samples will be at front desk.

## 2012-06-18 NOTE — Progress Notes (Signed)
Called patient to inform of normal labs,left message of results, mailed copy to patient.

## 2012-06-20 ENCOUNTER — Other Ambulatory Visit: Payer: Self-pay | Admitting: Family Medicine

## 2012-06-21 NOTE — Telephone Encounter (Signed)
Since one controlled, nay ref both for 6 mos

## 2012-07-02 ENCOUNTER — Telehealth: Payer: Self-pay | Admitting: Neurology

## 2012-07-02 ENCOUNTER — Ambulatory Visit: Payer: Medicare Other | Admitting: Neurology

## 2012-07-02 NOTE — Telephone Encounter (Signed)
Please call with results

## 2012-07-08 ENCOUNTER — Other Ambulatory Visit: Payer: Self-pay | Admitting: Family Medicine

## 2012-07-11 ENCOUNTER — Other Ambulatory Visit: Payer: Self-pay | Admitting: Family Medicine

## 2012-07-11 NOTE — Telephone Encounter (Signed)
Last seen 05/28/12.

## 2012-07-11 NOTE — Telephone Encounter (Signed)
Ok five ref 

## 2012-07-17 ENCOUNTER — Telehealth: Payer: Self-pay | Admitting: *Deleted

## 2012-07-17 NOTE — Telephone Encounter (Signed)
Ammie, what do I need to do for Paul Baker?  I don't see any notes or instructions, just "results".

## 2012-07-17 NOTE — Telephone Encounter (Signed)
You may want to ask her which results she is referring to because she met with the patient on 05/31/2012 in the office to discuss the results of his sleep study (Titration and MSLT).  She may be referring to something different.

## 2012-07-23 ENCOUNTER — Ambulatory Visit (INDEPENDENT_AMBULATORY_CARE_PROVIDER_SITE_OTHER): Payer: Medicare Other | Admitting: Family Medicine

## 2012-07-23 ENCOUNTER — Encounter: Payer: Self-pay | Admitting: Family Medicine

## 2012-07-23 VITALS — BP 146/78 | HR 80 | Wt 247.0 lb

## 2012-07-23 DIAGNOSIS — E785 Hyperlipidemia, unspecified: Secondary | ICD-10-CM

## 2012-07-23 DIAGNOSIS — G4733 Obstructive sleep apnea (adult) (pediatric): Secondary | ICD-10-CM

## 2012-07-23 DIAGNOSIS — I10 Essential (primary) hypertension: Secondary | ICD-10-CM

## 2012-07-23 DIAGNOSIS — E1142 Type 2 diabetes mellitus with diabetic polyneuropathy: Secondary | ICD-10-CM

## 2012-07-23 DIAGNOSIS — G4710 Hypersomnia, unspecified: Secondary | ICD-10-CM

## 2012-07-23 DIAGNOSIS — E119 Type 2 diabetes mellitus without complications: Secondary | ICD-10-CM

## 2012-07-23 DIAGNOSIS — E1140 Type 2 diabetes mellitus with diabetic neuropathy, unspecified: Secondary | ICD-10-CM | POA: Insufficient documentation

## 2012-07-23 DIAGNOSIS — I2510 Atherosclerotic heart disease of native coronary artery without angina pectoris: Secondary | ICD-10-CM

## 2012-07-23 DIAGNOSIS — E1149 Type 2 diabetes mellitus with other diabetic neurological complication: Secondary | ICD-10-CM

## 2012-07-23 DIAGNOSIS — Z9989 Dependence on other enabling machines and devices: Secondary | ICD-10-CM

## 2012-07-23 DIAGNOSIS — Z79899 Other long term (current) drug therapy: Secondary | ICD-10-CM

## 2012-07-23 MED ORDER — INSULIN NPH (HUMAN) (ISOPHANE) 100 UNIT/ML ~~LOC~~ SUSP: SUBCUTANEOUS | Status: DC

## 2012-07-23 NOTE — Progress Notes (Signed)
  Subjective:    Patient ID: Paul Baker, male    DOB: 1950/09/15, 62 y.o.   MRN: [id number]  Diabetes He has type 2 diabetes mellitus. His disease course has been worsening. Associated symptoms include foot paresthesias. Symptoms are worsening. Risk factors for coronary artery disease include diabetes mellitus, dyslipidemia, hypertension and male sex. He is following a low fat/cholesterol and generally healthy diet. Meal planning includes avoidance of concentrated sweets. He participates in exercise three times a week. His home blood glucose trend is increasing steadily. His breakfast blood glucose is taken between 7-8 am. His breakfast blood glucose range is generally 180-200 mg/dl. An ACE inhibitor/angiotensin II receptor blocker is being taken.    Results for orders placed in visit on 07/23/12  POCT GLYCOSYLATED HEMOGLOBIN (HGB A1C)      Result Value Range   Hemoglobin A1C 8.5     patient claims compliance with his blood pressure medication. Trying to watch his salt intake.  He reports his asthma has been flaring up somewhat with the poor air. Trying to avoid exertion in the middle of the day.  Restless legs are an ongoing challenge. States the medicine helps but not as much as he would hope.  Reflux is stable as long as he stays on his medications.  Reports progressive burning and discomfort in both feet worse in the evening.  Reports diminished energy. Stopped testosterone at our request. Wonders if he has to stay off of it.  Patient reports his CPAP device is still helping him. Review of Systems ROS no chest pain no excessive dyspnea otherwise negative.    Objective:   Physical Exam  Alert no acute distress. HEENT slight nasal congestion. Vitals reviewed. Lungs clear. Heart regular rate and rhythm. Feet distal diminished sensation. Pulses good no edema.      Assessment & Plan:  Impression #1 type 2 diabetes suboptimum control. Likely related to #2. #2 neuropathic pain  most likely cause diabetic neuropathy discussed. #3 hyperlipidemia status uncertain. #4 hypertension good control. #5 coronary artery disease asymptomatic #6 reflux ongoing but stable. #7 restless leg syndrome ongoing. #8 low testosterone concern for the patient. We still in encourage holding off supplement. #9 sleep apnea clinically stable on device. Plan as above. Plus appropriate blood work. Hold off on specific pain meds for feet. Recheck in several months. Easily 35 minutes spent most in discussion. WSL

## 2012-07-23 NOTE — Patient Instructions (Signed)
Increase morning insulin to 40.  Increase eve insulin to 34

## 2012-07-26 ENCOUNTER — Other Ambulatory Visit: Payer: Self-pay | Admitting: Adult Health

## 2012-07-26 ENCOUNTER — Other Ambulatory Visit: Payer: Self-pay | Admitting: Cardiology

## 2012-08-05 ENCOUNTER — Ambulatory Visit (INDEPENDENT_AMBULATORY_CARE_PROVIDER_SITE_OTHER): Payer: Medicare Other | Admitting: Family Medicine

## 2012-08-05 ENCOUNTER — Encounter: Payer: Self-pay | Admitting: Family Medicine

## 2012-08-05 ENCOUNTER — Telehealth: Payer: Self-pay | Admitting: *Deleted

## 2012-08-05 VITALS — BP 144/84 | Temp 98.7°F | Ht 66.0 in | Wt 247.0 lb

## 2012-08-05 DIAGNOSIS — J019 Acute sinusitis, unspecified: Secondary | ICD-10-CM

## 2012-08-05 MED ORDER — CLINDAMYCIN HCL 300 MG PO CAPS: 300.0000 mg | ORAL_CAPSULE | Freq: Three times a day (TID) | ORAL | Status: DC

## 2012-08-05 MED ORDER — HYDROCODONE-ACETAMINOPHEN 10-325 MG PO TABS: 1.0000 | ORAL_TABLET | ORAL | Status: DC | PRN

## 2012-08-05 MED ORDER — DOXYCYCLINE HYCLATE 100 MG PO CAPS: 100.0000 mg | ORAL_CAPSULE | Freq: Two times a day (BID) | ORAL | Status: DC

## 2012-08-05 NOTE — Progress Notes (Signed)
  Subjective:    Patient ID: Paul Baker, male    DOB: 12-18-1950, 62 y.o.   MRN: [id number]  HPIHere for facial pain and swelling around left eye. Upper teeth are hurting. Gland swollen on left side of neck. Symptoms started Friday.  Patient denies any high fever did have low-grade fever over the weekend along with some chills denies nausea vomiting does relate headache. Has had a recent problem with this before. Had a knee infection after surgery that was MRSA  PMH benign Review of Systems See above    Objective:   Physical Exam  Left nasal area reddened swollen tender painful consistent with a MRSA infection. Throat normal neck supple lungs clear      Assessment & Plan:  Culture Doxy and clindamycin Fu if worse MRSA probable Patient was told warm compresses frequently use the antibiotics as directed if not improving over the course of the next 48 hours or if worse to followup or go to the ER. If fails outpatient treatment next step would be hospitalization patient is also aware of watching his sugars closely and notifying us if any problems

## 2012-08-05 NOTE — Patient Instructions (Signed)
Cellulitis Cellulitis is an infection of the skin and the tissue beneath it. The infected area is usually red and tender. Cellulitis occurs most often in the arms and lower legs.  CAUSES  Cellulitis is caused by bacteria that enter the skin through cracks or cuts in the skin. The most common types of bacteria that cause cellulitis are Staphylococcus and Streptococcus. SYMPTOMS   Redness and warmth.  Swelling.  Tenderness or pain.  Fever. DIAGNOSIS  Your caregiver can usually determine what is wrong based on a physical exam. Blood tests may also be done. TREATMENT  Treatment usually involves taking an antibiotic medicine. HOME CARE INSTRUCTIONS   Take your antibiotics as directed. Finish them even if you start to feel better.  Keep the infected arm or leg elevated to reduce swelling.  Apply a warm cloth to the affected area up to 4 times per day to relieve pain.  Only take over-the-counter or prescription medicines for pain, discomfort, or fever as directed by your caregiver.  Keep all follow-up appointments as directed by your caregiver. SEEK MEDICAL CARE IF:   You notice red streaks coming from the infected area.  Your red area gets larger or turns dark in color.  Your bone or joint underneath the infected area becomes painful after the skin has healed.  Your infection returns in the same area or another area.  You notice a swollen bump in the infected area.  You develop new symptoms. SEEK IMMEDIATE MEDICAL CARE IF:   You have a fever.  You feel very sleepy.  You develop vomiting or diarrhea.  You have a general ill feeling (malaise) with muscle aches and pains. MAKE SURE YOU:   Understand these instructions.  Will watch your condition.  Will get help right away if you are not doing well or get worse. Document Released: 09/28/2004 Document Revised: 06/20/2011 Document Reviewed: 03/06/2011 Exit[employer] Patient Information 2014 [employer].  

## 2012-08-05 NOTE — Telephone Encounter (Signed)
PT states he has been having cramping with the crestor and would like to be put on another medication

## 2012-08-05 NOTE — Telephone Encounter (Signed)
Please advise in absence of DR SM

## 2012-08-07 MED ORDER — PRAVASTATIN SODIUM 10 MG PO TABS: 10.0000 mg | ORAL_TABLET | Freq: Every day | ORAL | Status: DC

## 2012-08-07 NOTE — Telephone Encounter (Signed)
Has he tried Pravastatin? Could try that at 10 mg daily to start.

## 2012-08-07 NOTE — Telephone Encounter (Signed)
Spoke to pt to advise results/instructions. Pt understood. Sent new RX to pharmacy

## 2012-08-07 NOTE — Telephone Encounter (Signed)
.  left message to have patient return my call.  

## 2012-08-09 LAB — WOUND CULTURE: Gram Stain: NONE SEEN

## 2012-08-12 ENCOUNTER — Other Ambulatory Visit: Payer: Self-pay | Admitting: Family Medicine

## 2012-09-03 ENCOUNTER — Telehealth: Payer: Self-pay | Admitting: Family Medicine

## 2012-09-03 NOTE — Telephone Encounter (Signed)
See chart pt dropped fasting sugar levels

## 2012-09-03 NOTE — Telephone Encounter (Addendum)
Per Dr. Brett Canales - Add 8 units of insulin to both am and pm doses of insulin and leave readings again next week. Patient notified.

## 2012-09-04 ENCOUNTER — Other Ambulatory Visit: Payer: Self-pay | Admitting: Family Medicine

## 2012-09-04 LAB — LIPID PANEL
Cholesterol: 147 mg/dL (ref 0–200)
HDL: 39 mg/dL — ABNORMAL LOW (ref >39)
LDL Cholesterol: 73 mg/dL (ref 0–99)
Triglycerides: 174 mg/dL — ABNORMAL HIGH (ref <150)
VLDL: 35 mg/dL (ref 0–40)

## 2012-09-04 LAB — HEPATIC FUNCTION PANEL
ALT: 27 U/L (ref 0–53)
Albumin: 4.3 g/dL (ref 3.5–5.2)
Total Protein: 6.7 g/dL (ref 6.0–8.3)

## 2012-09-06 ENCOUNTER — Ambulatory Visit (INDEPENDENT_AMBULATORY_CARE_PROVIDER_SITE_OTHER): Payer: Medicare Other | Admitting: Neurology

## 2012-09-06 ENCOUNTER — Encounter: Payer: Self-pay | Admitting: Neurology

## 2012-09-06 VITALS — BP 126/73 | HR 86 | Ht 65.0 in | Wt 243.0 lb

## 2012-09-06 DIAGNOSIS — G4710 Hypersomnia, unspecified: Secondary | ICD-10-CM

## 2012-09-06 DIAGNOSIS — G2581 Restless legs syndrome: Secondary | ICD-10-CM

## 2012-09-06 DIAGNOSIS — G253 Myoclonus: Secondary | ICD-10-CM

## 2012-09-06 DIAGNOSIS — G4730 Sleep apnea, unspecified: Secondary | ICD-10-CM

## 2012-09-06 DIAGNOSIS — G4733 Obstructive sleep apnea (adult) (pediatric): Secondary | ICD-10-CM

## 2012-09-06 MED ORDER — ARMODAFINIL 50 MG PO TABS: 100.0000 mg | ORAL_TABLET | Freq: Every day | ORAL | Status: DC

## 2012-09-06 MED ORDER — CLONAZEPAM 1 MG PO TABS: 1.0000 mg | ORAL_TABLET | Freq: Every evening | ORAL | Status: DC | PRN

## 2012-09-06 NOTE — Progress Notes (Signed)
Chief Complaint  Patient presents with  . Follow-up    OSA,rm 10   The patient returns today  for a revisit. He has been using a CPAP  With variable pressure settings. I reviewed his CPAP a little titration from the date 04/05/2012 through 05/04/2012. His 95th percentile pressure is 14 cm, his AHI is 0.6. I consider his apnea optimally treated. Average daily usages 5 hours and 20 minutes for the last 30 days he would be 100% compliant by Medicare criteria. Annual download dated 7-17 2014 for a 35 day. She'll that the patient used 100% of all recorded base CPAP with a minimum pressure of 6 cm maximum pressure of 16 cm in his 95th percentile pressure was 14.6 cm water his residual AHI is 1.2. Average usage time is 5 hours 6 minutes. It is my impression that the patient apnea cannot be responsible for his daytime sleepiness anymore. We are now discussing possible narcolepsy versus severe impairment from the restless legs causing this excessive daytime sleepiness. I have tried the patient on NUPRO  Patch at 2 strength ,  each did not help him to fall asleep easier or maintain sleep.  Epworth sleepiness score of was endorsed still at 20 points(  while using CPAP). Today it is 18 points , still highly abnormal on high dose dopaminergic medication. He endorses RLS with myoclonus , and may respond to Klonipin.  .  FSS  27  Points.  The patient had a severe nasal sinus infection on 08-15-12 MRSA . Propranolol at 4-6 mg at bedtime, and lorazepam 1 mg at bedtime have still not controlled to restless legs and the resulting sleep difficulties the patient continues to feel severely impaired and is quality of life and quality of sleep by restless legs and less so by apnea. He likes his auto PAP , it works well for him. He had no longer morning headaches as he used to, and has rare nocturia.    The patient, who also has restless leg syndrome, endorsed a moderate severity of restless left leg symptoms at night which  will which could contribute to his residual high degree of sleepiness .Please review the attached study reports in Epic.         This patient with severe obstructive sleep apnea followed by Dr. Terrace Arabia. AHI 62 titrated to 16 cm water. DME was [employer] ,  PCP Dr.-Luking.    Apnea cannot longer be the cause for his excessive daytime sleepiness, I will try to adjust her medication treatment for restless leg syndrome.  In addition given the clinical presentation of associated vivid dreams and some sleep hallucinations and dream  intrusions, it was  advisable to perform MS LT to evaluate for narcolepsy or related sleep disorder is causing the persistent hypersomnia. His MSL T. date at 5-9 2014 showed a mean sleep latency of 2.3 minutes REM latency 8.5 minutes is 1 sleep onset REM.  Total sleep time preceding this test 349 minutes sleep was severely interrupted by periodic limb movements.   Based on these results the patient will change from ativan to [facility] at night,  and I would prescribe a stimulant to help him stay awake and daytime. This patient has  known coronary artery disease. Pending his cardiologist to agree or disagree with a medication, I suggested Nuvigil. Dr. Rana Snare . Renaxa can't be used with NUVIGIL and he was weaned off Renaxa. He should be able to use it now.   Obtain an anemia panel to complete  has review of metabolic factors contributing to restless leg syndrome. The patient is aware that his risk factors include thyroid disease, diabetic neuropathy, but also had worsening of restless legs after a knee surgery possibly due to blood loss.     100% compliance. With CPAP. As discussed during visit. Normal FE , ferritin levels.  General: The patient is awake, alert and appears not in acute distress. The patient is well groomed. Head: Normocephalic, atraumatic. Neck is supple. Cardiovascular:  Regular rate and rhythm, without  murmurs or carotid bruit, and without  distended neck veins. Respiratory: Lungs are clear to auscultation. Skin:  Without evidence of edema, or rash Trunk: BMI is elevated , patient  has normal posture.   Neurologic exam : The patient is awake and alert, oriented to place and time.  Memory subjective described as intact. There is a normal attention span & concentration ability.  Mood and affect are appropriate.  Cranial nerves: Pupils are equal and briskly reactive to light. . Extraocular movements  in vertical and horizontal planes intact and without nystagmus. Visual fields by finger perimetry are intact. Hearing to finger rub intact.  Facial sensation intact to fine touch. Facial motor strength is symmetric and tongue and uvula move midline.  Motor exam:   Normal tone and normal muscle bulk and symmetric normal strength in all extremities.the patient reports dropping objects, tingling in both hands.   Sensory:  Fine touch, pinprick and vibration were tested in all extremities.  Coordination: Rapid alternating movements in the fingers/hands is tested and normal. Finger-to-nose maneuver tested and normal without evidence of ataxia, dysmetria or tremor.  Gait and station: Patient walks without assistive device , climb up to the exam table. Strength within normal limits. Stance is stable and normal.   Attenuated  Reflexes, unusual for a patient with documented spinal stenosis.   Assessment:   OSA - treated on CPAP with very good of sleep apnea, documented in general CPAP downloads the last one from 7-17 2014. Residual AHI was 1.2. Restless leg syndrome with myoclonus, has not been responding well to increased doses of dopaminergic medication.   The patient will now be treated with Klonopin and to be taken 2 hours before bedtime a second doors can be used in the in the night if he awakens. He is also will allow her not to take modafinil and daytime, since the conflicting medication of Ranexa has been discontinued. The patient was  advised that iron deficiency did not lead to his problems, review of laboratory results from his last encounter.   He is to continue the use of CPAP and actually likes the CPAP. I would discontinue the lorazepam in exchange for Klonopin. Spinal stenosis contributes to RLS.         Father and daughter have RLS with Myoclonus.

## 2012-09-10 ENCOUNTER — Telehealth: Payer: Self-pay | Admitting: Family Medicine

## 2012-09-10 ENCOUNTER — Other Ambulatory Visit: Payer: Self-pay | Admitting: *Deleted

## 2012-09-10 NOTE — Telephone Encounter (Signed)
See note on lab sheet

## 2012-09-10 NOTE — Telephone Encounter (Signed)
Discussed with pt. Pt made appt for next week.

## 2012-09-10 NOTE — Telephone Encounter (Signed)
See chart for Glucose levels

## 2012-09-18 ENCOUNTER — Encounter: Payer: Self-pay | Admitting: Family Medicine

## 2012-09-18 ENCOUNTER — Ambulatory Visit (INDEPENDENT_AMBULATORY_CARE_PROVIDER_SITE_OTHER): Payer: Medicare Other | Admitting: Family Medicine

## 2012-09-18 VITALS — BP 122/80 | Ht 66.0 in | Wt 243.0 lb

## 2012-09-18 DIAGNOSIS — I10 Essential (primary) hypertension: Secondary | ICD-10-CM

## 2012-09-18 DIAGNOSIS — E1149 Type 2 diabetes mellitus with other diabetic neurological complication: Secondary | ICD-10-CM

## 2012-09-18 DIAGNOSIS — IMO0002 Reserved for concepts with insufficient information to code with codable children: Secondary | ICD-10-CM

## 2012-09-18 DIAGNOSIS — E1142 Type 2 diabetes mellitus with diabetic polyneuropathy: Secondary | ICD-10-CM

## 2012-09-18 DIAGNOSIS — I2510 Atherosclerotic heart disease of native coronary artery without angina pectoris: Secondary | ICD-10-CM

## 2012-09-18 DIAGNOSIS — E1140 Type 2 diabetes mellitus with diabetic neuropathy, unspecified: Secondary | ICD-10-CM

## 2012-09-18 DIAGNOSIS — Z23 Encounter for immunization: Secondary | ICD-10-CM

## 2012-09-18 DIAGNOSIS — E1165 Type 2 diabetes mellitus with hyperglycemia: Secondary | ICD-10-CM

## 2012-09-18 MED ORDER — INSULIN NPH (HUMAN) (ISOPHANE) 100 UNIT/ML ~~LOC~~ SUSP: SUBCUTANEOUS | Status: DC

## 2012-09-18 NOTE — Progress Notes (Signed)
  Subjective:    Patient ID: Paul Baker, male    DOB: Dec 15, 1950, 62 y.o.   MRN: [id number]  Diabetes He presents for his follow-up diabetic visit. He has type 2 diabetes mellitus. His disease course has been stable. There are no hypoglycemic associated symptoms. There are no diabetic associated symptoms. There are no hypoglycemic complications. Symptoms are stable. There are no diabetic complications. There are no known risk factors for coronary artery disease. Current diabetic treatment includes insulin injections. He is compliant with treatment all of the time.  Discuss lab results, last A1C was done on 07/23/12. Results for orders placed in visit on 08/05/12  WOUND CULTURE      Result Value Range   Culture       Value: Abundant METHICILLIN RESISTANT STAPHYLOCOCCUS AUREUS   Gram Stain No WBC Seen     Gram Stain No Squamous Epithelial Cells Seen     Gram Stain Rare GRAM POSITIVE COCCI IN CLUSTERS     Organism ID, Bacteria METHICILLIN RESISTANT STAPHYLOCOCCUS AUREUS      Sugars still very high.,,  Eating the right thing best that he can. Trying to eat the right thing.  Patient reports compliance with his lipid medicine. No obvious side effects. Trying to watch his diet best he can in this regard.  Patient reports asthma stable. No significant wheezing. Uses an inhaler only rarely.  Review of Systems No chest pain no back pain no abdominal pain ongoing joint pain ROS otherwise negative    Objective:   Physical Exam  Alert no acute distress HEENT normal. Lungs clear no wheezes today heart regular in rhythm. Feet no significant edema sensation intact pulses good     Assessment & Plan:  Impression 1 type 2 diabetes poor control. Number still remain elevated despite rising insulin loads. #2 hyperlipidemia discussed her 3 asthma clinically stable. #4 hyperlipidemia numbers reviewed. #5 reflux stable. #6 restless leg syndrome stable per patient. Plan increase insulin. Endocrinology  referral rationale discussed. Recheck in several months. Flu shot encourage. WSL

## 2012-09-23 ENCOUNTER — Encounter: Payer: Self-pay | Admitting: Family Medicine

## 2012-09-26 ENCOUNTER — Other Ambulatory Visit: Payer: Self-pay | Admitting: Family Medicine

## 2012-09-26 ENCOUNTER — Other Ambulatory Visit: Payer: Self-pay | Admitting: Cardiology

## 2012-09-30 ENCOUNTER — Telehealth: Payer: Self-pay

## 2012-09-30 DIAGNOSIS — G253 Myoclonus: Secondary | ICD-10-CM

## 2012-09-30 DIAGNOSIS — G4730 Sleep apnea, unspecified: Secondary | ICD-10-CM

## 2012-09-30 DIAGNOSIS — G4710 Hypersomnia, unspecified: Secondary | ICD-10-CM

## 2012-09-30 NOTE — Telephone Encounter (Signed)
[facility] pharmacy sent [place] a fax saying the patient has asked them to contact us to request a dose increase on Klonopin.  The patient would like to go from 1 mg hs to  2 mg hs.  Would you like to increase the dose?  Please advise.  Thank you.

## 2012-10-01 MED ORDER — CLONAZEPAM 1 MG PO TABS: 2.0000 mg | ORAL_TABLET | Freq: Every evening | ORAL | Status: DC | PRN

## 2012-10-03 ENCOUNTER — Telehealth: Payer: Self-pay | Admitting: Cardiology

## 2012-10-03 NOTE — Telephone Encounter (Signed)
Pt wife advised pt is not home at this time however was advised by Endo MD Neda at [facility] today 10-03-12 he needs to see Dr Dionicia Abler per possible issues, pt scheduled for earliest available 10-24-12 at 1pm, pt wife will inform spouse of apt and accepted apt

## 2012-10-03 NOTE — Telephone Encounter (Signed)
Patient would like return phone call / tgs  °

## 2012-10-14 ENCOUNTER — Ambulatory Visit (INDEPENDENT_AMBULATORY_CARE_PROVIDER_SITE_OTHER): Payer: Medicare Other | Admitting: Family Medicine

## 2012-10-14 ENCOUNTER — Ambulatory Visit (HOSPITAL_COMMUNITY)
Admission: RE | Admit: 2012-10-14 | Discharge: 2012-10-14 | Disposition: A | Discharge status: Home / Self Care | Payer: Medicare Other | Source: Ambulatory Visit | Attending: Family Medicine | Admitting: Family Medicine

## 2012-10-14 ENCOUNTER — Encounter: Payer: Self-pay | Admitting: Family Medicine

## 2012-10-14 VITALS — BP 130/82 | Temp 97.9°F | Ht 65.0 in | Wt 237.0 lb

## 2012-10-14 DIAGNOSIS — H8110 Benign paroxysmal vertigo, unspecified ear: Secondary | ICD-10-CM

## 2012-10-14 DIAGNOSIS — R42 Dizziness and giddiness: Secondary | ICD-10-CM | POA: Insufficient documentation

## 2012-10-14 DIAGNOSIS — I10 Essential (primary) hypertension: Secondary | ICD-10-CM | POA: Insufficient documentation

## 2012-10-14 DIAGNOSIS — R109 Unspecified abdominal pain: Secondary | ICD-10-CM

## 2012-10-14 DIAGNOSIS — E119 Type 2 diabetes mellitus without complications: Secondary | ICD-10-CM | POA: Insufficient documentation

## 2012-10-14 MED ORDER — OMEPRAZOLE 20 MG PO CPDR: 20.0000 mg | DELAYED_RELEASE_CAPSULE | Freq: Two times a day (BID) | ORAL | Status: DC

## 2012-10-14 MED ORDER — MECLIZINE HCL 25 MG PO TABS: 25.0000 mg | ORAL_TABLET | Freq: Three times a day (TID) | ORAL | Status: DC | PRN

## 2012-10-14 NOTE — Progress Notes (Signed)
  Subjective:    Patient ID: Paul Baker, male    DOB: Aug 18, 1950, 62 y.o.   MRN: [id number]  HPI Patient is here today b/c he is feeling dizzy. It started this morning around 5 am. He took his blood sugar and it was 169 which is normal for him. He does have loss of balance. He feels nauseous as well.   spinnins sensation. unsteady walking. Mild headache today. Felt fine yesterday.  Nauseated, unsteady at times  He has had abdominal pain for a couple of months now as well.  abd pain, intermittent pain,. The abdominal pain is worse after eating. He saw his endocrinologist he brought up a potential serious diagnosis of diminished blood flow to the intestines. Patient does have a history of reflux. Takes omeprazole 20 each day. Develops epigastric distress usually after meals. Describes as a deep aching. Sometimes radiating to his back.        Review of Systems Ongoing chronic joint pain no true chest pain no dyspnea ROS otherwise negative    Objective:   Physical Exam Alert some distress. Very unsteady when walking HEENT slight nasal congestion. Lungs clear. Heart regular in rhythm. Neuro exam no obvious cerebellar dysfunction. No focal deficits. Reflexes good. Strength good. But very unsteady when moving head. Notes spinning sensation when changing position.  Abdomen large no particular tenderness no rebound no guarding       Assessment & Plan:  Impression acute vertigo concerning with patient's many risk factors and the underlying coronary artery disease this could represent simple in her ear dysfunction, or could represent a type of stroke. Discussed with patient. #2 abdominal pain couple months duration further investigation certainly warranted. Other considerations before ischemia. Plan stat CT of the brain. Ultrasound abdomen meclizine when necessary for dizziness. Double up on omeprazole. Followup in one week. WSL

## 2012-10-21 ENCOUNTER — Ambulatory Visit: Payer: Medicare Other | Admitting: Family Medicine

## 2012-10-24 ENCOUNTER — Ambulatory Visit (INDEPENDENT_AMBULATORY_CARE_PROVIDER_SITE_OTHER): Payer: Medicare Other | Admitting: Cardiology

## 2012-10-24 ENCOUNTER — Ambulatory Visit: Payer: Medicare Other | Admitting: Family Medicine

## 2012-10-24 ENCOUNTER — Encounter: Payer: Self-pay | Admitting: Cardiology

## 2012-10-24 VITALS — BP 119/68 | HR 68 | Ht 65.0 in | Wt 234.2 lb

## 2012-10-24 DIAGNOSIS — IMO0001 Reserved for inherently not codable concepts without codable children: Secondary | ICD-10-CM

## 2012-10-24 DIAGNOSIS — I2510 Atherosclerotic heart disease of native coronary artery without angina pectoris: Secondary | ICD-10-CM

## 2012-10-24 DIAGNOSIS — I10 Essential (primary) hypertension: Secondary | ICD-10-CM

## 2012-10-24 DIAGNOSIS — E785 Hyperlipidemia, unspecified: Secondary | ICD-10-CM

## 2012-10-24 DIAGNOSIS — IMO0002 Reserved for concepts with insufficient information to code with codable children: Secondary | ICD-10-CM

## 2012-10-24 DIAGNOSIS — E1165 Type 2 diabetes mellitus with hyperglycemia: Secondary | ICD-10-CM

## 2012-10-24 NOTE — Assessment & Plan Note (Signed)
Continue close followup with Dr. Fransico Him.

## 2012-10-24 NOTE — Assessment & Plan Note (Signed)
Blood pressure is well controlled today. No changes made. 

## 2012-10-24 NOTE — Assessment & Plan Note (Signed)
On low-dose Pravachol, LDL at goal at 73.

## 2012-10-24 NOTE — Assessment & Plan Note (Signed)
He has lost some weight, approximately 10 pounds through diet and exercise.

## 2012-10-24 NOTE — Progress Notes (Signed)
Clinical Summary Mr. Hemme is a  62 y.o.male last seen in June of this year. He continues to follow with Dr. [name] and Dr. Fransico Him. Seems to be doing reasonably well. He still has intermittent chest pain symptoms, but reports that these are not worsening. He continues to exercise regularly, also working on better blood sugar control. His weight is down approximately 10 pounds since June.  Lab work from September showed total cholesterol 147, triglycerides 174, HDL 39, LDL 73, normal LFTs. Last HgbA1C was 8.5.  We reviewed his cardiac medications.   Allergies  Allergen Reactions  . Cardizem [Diltiazem Hcl]     Edema   . Cardura [Doxazosin Mesylate]     Side effects  . Lipitor [Atorvastatin]   . Lotensin [Benazepril Hcl]     Side effects   . Paxil [Paroxetine Hcl]     Side effects   . Codeine Rash and Other (See Comments)    Headache     Current Outpatient Prescriptions  Medication Sig Dispense Refill  . amLODipine (NORVASC) 2.5 MG tablet Take 2.5 mg by mouth daily.      [name] aspirin 325 MG tablet Take 325 mg by mouth daily.      . Canagliflozin (INVOKANA) 100 MG TABS Take 100 mg by mouth daily.      . clonazePAM (KLONOPIN) 1 MG tablet Take 2 tablets (2 mg total) by mouth at bedtime as needed for anxiety. For the treatment of RLS - take one half tab.po  in PM and another one half prn ,  if RLS wakes you before 3 AM .  30 tablet  0  . co-enzyme Q-10 30 MG capsule Take 30 mg by mouth daily.      . fenofibrate 160 MG tablet TAKE (1) TABLET BY MOUTH AT BEDTIME.  30 tablet  1  . HYDROcodone-acetaminophen (NORCO) 10-325 MG per tablet Take 1 tablet by mouth every 4 (four) hours as needed for pain.  40 tablet  0  . insulin lispro protamine-lispro (HUMALOG 50/50) (50-50) 100 UNIT/ML SUSP injection Inject into the skin 2 (two) times daily before a meal.      . isosorbide mononitrate (IMDUR) 60 MG 24 hr tablet TAKE (1/2) TABLET BY MOUTH TWICE DAILY.  60 tablet  3  . loratadine (CLARITIN)  10 MG tablet Take 10 mg by mouth at bedtime.      . meclizine (ANTIVERT) 25 MG tablet Take 1 tablet (25 mg total) by mouth 3 (three) times daily as needed.  30 tablet  0  . metFORMIN (GLUCOPHAGE) 500 MG tablet TAKE 2 TABLETS EACH MORNING, 1 TABLET AT NOON, AND 2 TABLETS EVERY EVENING.  150 tablet  2  . metoprolol succinate (TOPROL-XL) 50 MG 24 hr tablet TAKE (1) TABLET BY MOUTH TWICE DAILY.  60 tablet  6  . Multiple Vitamins-Minerals (MULTIVITAMIN WITH MINERALS) tablet Take 1 tablet by mouth daily.        [name] NITROQUICK 0.4 MG SL tablet DISSOLVE 1 TABLET UNDER TONGUE EVERY 5 MINUTES UP TO 15 MIN FOR CHESTPAIN. IF NO RELIEF CALL 911.  25 each  3  . NUVIGIL 50 MG tablet Take 50 mg by mouth daily.       . Omega-3 Fatty Acids (FISH OIL) 1000 MG CAPS Take 1 capsule by mouth daily.        [name] omeprazole (PRILOSEC) 20 MG capsule Take 1 capsule (20 mg total) by mouth 2 (two) times daily.  60 capsule  5  .  pravastatin (PRAVACHOL) 10 MG tablet Take 1 tablet (10 mg total) by mouth daily.  90 tablet  1  . rOPINIRole (REQUIP) 4 MG tablet USE AS DIRECTED  100 tablet  5  . triamterene-hydrochlorothiazide (MAXZIDE-25) 37.5-25 MG per tablet TAKE ONE TABLET DAILY.  90 tablet  1  . VENTOLIN HFA 108 (90 BASE) MCG/ACT inhaler Inhale 1 puff into the lungs as needed.       . vitamin C (ASCORBIC ACID) 500 MG tablet Take 500 mg by mouth daily.       No current facility-administered medications for this visit.    Past Medical History  Diagnosis Date  . Arthritis   . Type 2 diabetes mellitus   . Mixed hyperlipidemia   . Essential hypertension, benign   . Septic arthritis of knee, left   . OSA (obstructive sleep apnea)     Sleep study 2007  . Coronary atherosclerosis of native coronary artery     Diseased nondominant RCA  . Restless leg syndrome   . Pulmonary embolism 2004  . DVT (deep venous thrombosis)   . Asthma   . Rotator cuff disorder   . Colon polyp   . Morbid obesity   . OSA on CPAP   . RLS (restless  legs syndrome)   . Hypersomnia     on CPAP of 16 cm, diagnosed with AHI of 60 in 2012,epworth 21- narcolepsy?  . Cataplexy   . High cholesterol   . MRSA (methicillin resistant staph aureus) culture positive     08/2012    Social History Mr. Gentzler reports that he quit smoking about 17 years ago. His smoking use included Cigarettes. He smoked 0.00 packs per day. He has never used smokeless tobacco. Mr. Hert reports that he does not drink alcohol.  Review of Systems Has had some mild epigastric pain and abdominal bloating, seems to be better over the last few weeks. No change in bowel habits. Appetite is stable. No claudication. Had an episode of prolonged dizziness recently, states blood pressure, heart rate, and blood sugar were normal. Was evaluated by Dr. [name]. Otherwise negative.  Physical Examination Filed Vitals:   10/24/12 1249  BP: 119/68  Pulse: 68   Filed Weights   10/24/12 1249  Weight: 234 lb 4 oz (106.255 kg)    Obese male in no acute distress without active chest pain.  HEENT: Conjunctiva and lids normal, oropharynx with moist mucosa.  Neck: Supple, no elevated JVP or carotid bruits, no thyromegaly.  Lungs: Clear to auscultation, nonlabored, no wheezing.  Cardiac: Regular rate and rhythm, no S3 gallop or pericardial rub, no significant systolic murmur.  Abdomen: Soft, nontender, bowel sounds present. No bruits. Extremities: Trace to 1+ edema below the knees bilaterally, distal pulses one plus.  Musculoskeletal: No gross deformities.   Problem List and Plan   CORONARY ATHEROSCLEROSIS NATIVE CORONARY ARTERY Symptomatically stable with intermittent angina. Continue medical therapy and observation. Encouraged diet and regular exercise, which he seems to be engaged in.  Essential hypertension, benign Blood pressure is well-controlled today. No changes made.  Obesity, morbid He has lost some weight, approximately 10 pounds through diet and  exercise.  Type 2 diabetes mellitus, uncontrolled Continue close followup with Dr. Fransico Him.  HYPERLIPIDEMIA On low-dose Pravachol, LDL at goal at 73.    Jonelle Sidle, M.D., F.A.C.C.

## 2012-10-24 NOTE — Patient Instructions (Addendum)
Your physician recommends that you schedule a follow-up appointment in: 6 months with Dr McDowell You will receive a reminder letter two months in advance reminding you to call and schedule your appointment. If you don't receive this letter, please contact our office.  Your physician recommends that you continue on your current medications as directed. Please refer to the Current Medication list given to you today.   

## 2012-10-24 NOTE — Assessment & Plan Note (Signed)
Symptomatically stable with intermittent angina. Continue medical therapy and observation. Encouraged diet and regular exercise, which he seems to be engaged in.

## 2012-11-06 DIAGNOSIS — Z0289 Encounter for other administrative examinations: Secondary | ICD-10-CM

## 2012-11-20 ENCOUNTER — Other Ambulatory Visit: Payer: Self-pay | Admitting: Family Medicine

## 2012-11-22 ENCOUNTER — Other Ambulatory Visit: Payer: Self-pay

## 2012-11-22 DIAGNOSIS — G4730 Sleep apnea, unspecified: Secondary | ICD-10-CM

## 2012-11-22 DIAGNOSIS — G4710 Hypersomnia, unspecified: Secondary | ICD-10-CM

## 2012-11-22 DIAGNOSIS — G253 Myoclonus: Secondary | ICD-10-CM

## 2012-11-22 MED ORDER — CLONAZEPAM 1 MG PO TABS: ORAL_TABLET | ORAL | Status: DC

## 2012-11-22 NOTE — Telephone Encounter (Signed)
Patient's prescription was faxed over to [facility] at [phone].

## 2012-11-25 ENCOUNTER — Other Ambulatory Visit: Payer: Self-pay | Admitting: Family Medicine

## 2012-12-13 ENCOUNTER — Emergency Department (HOSPITAL_COMMUNITY): Payer: Medicare Other

## 2012-12-13 ENCOUNTER — Emergency Department (HOSPITAL_COMMUNITY)
Admission: EM | Admit: 2012-12-13 | Discharge: 2012-12-13 | Disposition: A | Discharge status: Home / Self Care | Payer: Medicare Other | Attending: Emergency Medicine | Admitting: Emergency Medicine

## 2012-12-13 ENCOUNTER — Encounter (HOSPITAL_COMMUNITY): Payer: Self-pay | Admitting: Emergency Medicine

## 2012-12-13 ENCOUNTER — Ambulatory Visit (INDEPENDENT_AMBULATORY_CARE_PROVIDER_SITE_OTHER): Payer: Medicare Other | Admitting: Nurse Practitioner

## 2012-12-13 ENCOUNTER — Encounter: Payer: Self-pay | Admitting: Nurse Practitioner

## 2012-12-13 VITALS — BP 136/86 | Temp 98.3°F | Ht 65.0 in | Wt 227.4 lb

## 2012-12-13 DIAGNOSIS — I10 Essential (primary) hypertension: Secondary | ICD-10-CM | POA: Insufficient documentation

## 2012-12-13 DIAGNOSIS — M7989 Other specified soft tissue disorders: Secondary | ICD-10-CM | POA: Insufficient documentation

## 2012-12-13 DIAGNOSIS — R739 Hyperglycemia, unspecified: Secondary | ICD-10-CM

## 2012-12-13 DIAGNOSIS — Z7982 Long term (current) use of aspirin: Secondary | ICD-10-CM | POA: Insufficient documentation

## 2012-12-13 DIAGNOSIS — E7800 Pure hypercholesterolemia, unspecified: Secondary | ICD-10-CM | POA: Insufficient documentation

## 2012-12-13 DIAGNOSIS — Z87891 Personal history of nicotine dependence: Secondary | ICD-10-CM | POA: Insufficient documentation

## 2012-12-13 DIAGNOSIS — I2510 Atherosclerotic heart disease of native coronary artery without angina pectoris: Secondary | ICD-10-CM | POA: Insufficient documentation

## 2012-12-13 DIAGNOSIS — Z860100 Personal history of colon polyps, unspecified: Secondary | ICD-10-CM | POA: Insufficient documentation

## 2012-12-13 DIAGNOSIS — Z96659 Presence of unspecified artificial knee joint: Secondary | ICD-10-CM | POA: Insufficient documentation

## 2012-12-13 DIAGNOSIS — M009 Pyogenic arthritis, unspecified: Secondary | ICD-10-CM | POA: Insufficient documentation

## 2012-12-13 DIAGNOSIS — R071 Chest pain on breathing: Secondary | ICD-10-CM | POA: Insufficient documentation

## 2012-12-13 DIAGNOSIS — G4710 Hypersomnia, unspecified: Secondary | ICD-10-CM | POA: Insufficient documentation

## 2012-12-13 DIAGNOSIS — Z791 Long term (current) use of non-steroidal anti-inflammatories (NSAID): Secondary | ICD-10-CM | POA: Insufficient documentation

## 2012-12-13 DIAGNOSIS — G2581 Restless legs syndrome: Secondary | ICD-10-CM | POA: Insufficient documentation

## 2012-12-13 DIAGNOSIS — Z9981 Dependence on supplemental oxygen: Secondary | ICD-10-CM | POA: Insufficient documentation

## 2012-12-13 DIAGNOSIS — Z8614 Personal history of Methicillin resistant Staphylococcus aureus infection: Secondary | ICD-10-CM | POA: Insufficient documentation

## 2012-12-13 DIAGNOSIS — R0789 Other chest pain: Secondary | ICD-10-CM

## 2012-12-13 DIAGNOSIS — R079 Chest pain, unspecified: Secondary | ICD-10-CM

## 2012-12-13 DIAGNOSIS — Z79899 Other long term (current) drug therapy: Secondary | ICD-10-CM | POA: Insufficient documentation

## 2012-12-13 DIAGNOSIS — Z794 Long term (current) use of insulin: Secondary | ICD-10-CM | POA: Insufficient documentation

## 2012-12-13 DIAGNOSIS — E119 Type 2 diabetes mellitus without complications: Secondary | ICD-10-CM | POA: Insufficient documentation

## 2012-12-13 DIAGNOSIS — I890 Lymphedema, not elsewhere classified: Secondary | ICD-10-CM

## 2012-12-13 DIAGNOSIS — J45909 Unspecified asthma, uncomplicated: Secondary | ICD-10-CM | POA: Insufficient documentation

## 2012-12-13 DIAGNOSIS — Z86718 Personal history of other venous thrombosis and embolism: Secondary | ICD-10-CM | POA: Insufficient documentation

## 2012-12-13 DIAGNOSIS — E782 Mixed hyperlipidemia: Secondary | ICD-10-CM | POA: Insufficient documentation

## 2012-12-13 DIAGNOSIS — Z86711 Personal history of pulmonary embolism: Secondary | ICD-10-CM | POA: Insufficient documentation

## 2012-12-13 DIAGNOSIS — G4733 Obstructive sleep apnea (adult) (pediatric): Secondary | ICD-10-CM | POA: Insufficient documentation

## 2012-12-13 HISTORY — DX: Narcolepsy without cataplexy: G47.419

## 2012-12-13 LAB — COMPREHENSIVE METABOLIC PANEL
Alkaline Phosphatase: 57 U/L (ref 39–117)
BUN: 33 mg/dL — ABNORMAL HIGH (ref 6–23)
Calcium: 9.8 mg/dL (ref 8.4–10.5)
Chloride: 94 mEq/L — ABNORMAL LOW (ref 96–112)
GFR calc Af Amer: 54 mL/min — ABNORMAL LOW (ref >90)
Glucose, Bld: 348 mg/dL — ABNORMAL HIGH (ref 70–99)
Potassium: 4 mEq/L (ref 3.5–5.1)
Total Bilirubin: 0.1 mg/dL — ABNORMAL LOW (ref 0.3–1.2)
Total Protein: 7.7 g/dL (ref 6.0–8.3)

## 2012-12-13 LAB — CBC WITH DIFFERENTIAL/PLATELET
Eosinophils Absolute: 0.2 10*3/uL (ref 0.0–0.7)
HCT: 40.5 % (ref 39.0–52.0)
Hemoglobin: 13.3 g/dL (ref 13.0–17.0)
Lymphs Abs: 1.9 10*3/uL (ref 0.7–4.0)
MCH: 28.7 pg (ref 26.0–34.0)
MCV: 87.5 fL (ref 78.0–100.0)
Monocytes Absolute: 0.8 10*3/uL (ref 0.1–1.0)
Monocytes Relative: 10 % (ref 3–12)
Neutro Abs: 5.5 10*3/uL (ref 1.7–7.7)
Neutrophils Relative %: 65 % (ref 43–77)
RBC: 4.63 MIL/uL (ref 4.22–5.81)
WBC: 8.4 10*3/uL (ref 4.0–10.5)

## 2012-12-13 LAB — PROTIME-INR
INR: 0.94 (ref 0.00–1.49)
Prothrombin Time: 12.4 seconds (ref 11.6–15.2)

## 2012-12-13 LAB — TROPONIN I
Troponin I: <0.3 ng/mL (ref <0.30)
Troponin I: <0.3 ng/mL (ref <0.30)

## 2012-12-13 LAB — GLUCOSE, CAPILLARY: Glucose-Capillary: 253 mg/dL — ABNORMAL HIGH (ref 70–99)

## 2012-12-13 LAB — PRO B NATRIURETIC PEPTIDE: Pro B Natriuretic peptide (BNP): 23.2 pg/mL (ref 0–125)

## 2012-12-13 MED ORDER — IBUPROFEN 800 MG PO TABS: 800.0000 mg | ORAL_TABLET | Freq: Three times a day (TID) | ORAL | Status: DC

## 2012-12-13 MED ORDER — IOHEXOL 300 MG/ML  SOLN
100.0000 mL | Freq: Once | INTRAMUSCULAR | Status: AC | PRN
  Administered 2012-12-13: 100 mL via INTRAVENOUS

## 2012-12-13 MED ORDER — INSULIN ASPART 100 UNIT/ML ~~LOC~~ SOLN
5.0000 [IU] | Freq: Once | SUBCUTANEOUS | Status: AC
  Administered 2012-12-13: 5 [IU] via SUBCUTANEOUS
  Filled 2012-12-13: qty 1

## 2012-12-13 MED ORDER — MORPHINE SULFATE 4 MG/ML IJ SOLN
4.0000 mg | Freq: Once | INTRAMUSCULAR | Status: AC
  Administered 2012-12-13: 4 mg via INTRAVENOUS
  Filled 2012-12-13: qty 1

## 2012-12-13 MED ORDER — HYDROCODONE-ACETAMINOPHEN 5-325 MG PO TABS: 2.0000 | ORAL_TABLET | ORAL | Status: DC | PRN

## 2012-12-13 NOTE — ED Notes (Signed)
Pt sent from Dr Cathlyn Parsons office, with swollen lymphnodes lt chest, axilla

## 2012-12-13 NOTE — Progress Notes (Signed)
Subjective:  Presents for complaints of swelling in the upper left chest wall area that began about 2 weeks ago. Got slightly better at one point, worse over the past few days. No fever. No cough. No unusual shortness of breath. Area very tender. Pain with movement. Pain is now radiating up into the left lateral neck area into the left scalp area near the ear.  Objective:   BP 136/86  Temp(Src) 98.3 F (36.8 C) (Oral)  Ht 5\' 5"  (1.651 m)  Wt 227 lb 6.4 oz (103.148 kg)  BMI 37.84 kg/m2 NAD. Alert, oriented. Lungs clear. Heart regular rate rhythm. Generalized area of edema noted in the left upper chest wall going into the axillary area towards the posterior axillary line. Entire area is tender to palpation, does not go past the bottom of the left breast. Tenderness radiates into the upper chest wall towards the left lateral neck area. Positive left cervical adenopathy which is tender. Lower extremities no edema.  Assessment:Chest pain  Lymphedema  Plan: Consult with Dr. Lorin Picket. Feel patient needs an urgent workup to rule out blood clot or tumor especially considering his complex medical history. Patient advised to go immediately to local ED, our nurse contacted their triage nurse to make her aware of situation.

## 2012-12-13 NOTE — ED Notes (Signed)
Patient with no complaints at this time. Respirations even and unlabored. Skin warm/dry. Discharge instructions reviewed with patient at this time. Patient given opportunity to voice concerns/ask questions. IV removed per policy and band-aid applied to site. Patient discharged at this time and left Emergency Department with steady gait.  

## 2012-12-13 NOTE — ED Provider Notes (Signed)
CSN: [id number]     Arrival date & time 12/13/12  1634 History   First MD Initiated Contact with Patient 12/13/12 1655     Chief Complaint  Patient presents with  . Lymphadenopathy   (Consider location/radiation/quality/duration/timing/severity/associated sxs/prior Treatment) HPI Comments: Patient presents from PCPs office with 2 week history of "swelling" to the left chest wall it radiates to the neck axilla. Pain is constant swelling comes and goes according to the family. It was worse last night. He denies any fevers, vomiting, shortness of breath, exertional chest pain. No trauma. His remote history of DVT and PE and there is concern for recurrent clot. He states his sugars have been elevated in the 400s at home. Denies any vomiting, back pain, cough. Endorses some mild swelling to lower extremity is.  The history is provided by the patient.    Past Medical History  Diagnosis Date  . Arthritis   . Type 2 diabetes mellitus   . Mixed hyperlipidemia   . Essential hypertension, benign   . Septic arthritis of knee, left   . OSA (obstructive sleep apnea)     Sleep study 2007  . Coronary atherosclerosis of native coronary artery     Diseased nondominant RCA  . Restless leg syndrome   . Pulmonary embolism 2004  . DVT (deep venous thrombosis)   . Asthma   . Rotator cuff disorder   . Colon polyp   . Morbid obesity   . OSA on CPAP   . RLS (restless legs syndrome)   . Hypersomnia     on CPAP of 16 cm, diagnosed with AHI of 60 in 2012,epworth 21- narcolepsy?  . Cataplexy   . High cholesterol   . MRSA (methicillin resistant staph aureus) culture positive     08/2012  . Narcolepsy    Past Surgical History  Procedure Laterality Date  . Total knee arthroplasty    . Lumbar disc surgery      Left L3, L4, L5 discecotomy with decompression of L4 root  . Wrist surgery    . Total knee revision    . Colonoscopy    . Back surgery     Family History  Problem Relation Age of Onset  .  Hypertension Father   . Heart attack Father   . Kidney Stones Father   . Diabetes Sister   . Seizures Grandchild   . Narcolepsy Grandchild    History  Substance Use Topics  . Smoking status: Former Smoker    Types: Cigarettes    Quit date: 01/03/1995  . Smokeless tobacco: Never Used  . Alcohol Use: No     Comment: quit drinking in 07/86    Review of Systems  Constitutional: Negative for fever, activity change and appetite change.  Respiratory: Positive for chest tightness and shortness of breath. Negative for cough.   Cardiovascular: Positive for leg swelling. Negative for chest pain.  Gastrointestinal: Negative for nausea, vomiting and abdominal pain.  Genitourinary: Negative for dysuria and hematuria.  Musculoskeletal: Negative for back pain.  Skin: Negative for rash.  Neurological: Negative for dizziness, weakness and headaches.  A complete 10 system review of systems was obtained and all systems are negative except as noted in the HPI and PMH.    Allergies  Cardizem; Cardura; Lipitor; Lotensin; Paxil; Adhesive; and Codeine  Home Medications   Current Outpatient Rx  Name  Route  Sig  Dispense  Refill  . amLODipine (NORVASC) 2.5 MG tablet   Oral   Take 2.5  mg by mouth daily.         [name] aspirin 325 MG tablet   Oral   Take 325 mg by mouth every evening.          . Canagliflozin (INVOKANA) 100 MG TABS   Oral   Take 100 mg by mouth every morning.          . clobetasol (TEMOVATE) 0.05 % external solution   Topical   Apply 1 application topically daily as needed. For irritation         . clonazePAM (KLONOPIN) 1 MG tablet   Oral   Take 0.5 mg by mouth at bedtime as needed and may repeat dose one time if needed (for RLS).         . DUREZOL 0.05 % EMUL   Left Eye   Place 1 drop into the left eye daily. Course was completed today         . fenofibrate 160 MG tablet   Oral   Take 160 mg by mouth every evening.         . insulin lispro  protamine-lispro (HUMALOG 50/50) (50-50) 100 UNIT/ML SUSP injection   Subcutaneous   Inject 40-45 Units into the skin 2 (two) times daily before a meal. Takes 40 units in the morning and 45 units in the evening         . isosorbide mononitrate (IMDUR) 60 MG 24 hr tablet   Oral   Take 30 mg by mouth 2 (two) times daily.         [name] loratadine (CLARITIN) 10 MG tablet   Oral   Take 10 mg by mouth at bedtime.         [name] losartan (COZAAR) 100 MG tablet   Oral   Take 100 mg by mouth every morning.         . metFORMIN (GLUCOPHAGE) 500 MG tablet   Oral   Take 1,000 mg by mouth 2 (two) times daily with a meal.         . metoprolol succinate (TOPROL-XL) 50 MG 24 hr tablet   Oral   Take 50 mg by mouth 2 (two) times daily. Take with or immediately following a meal.         . Multiple Vitamins-Minerals (MULTIVITAMIN WITH MINERALS) tablet   Oral   Take 1 tablet by mouth daily.           [name] NITROQUICK 0.4 MG SL tablet      DISSOLVE 1 TABLET UNDER TONGUE EVERY 5 MINUTES UP TO 15 MIN FOR CHESTPAIN. IF NO RELIEF CALL 911.   25 each   3   . NUVIGIL 50 MG tablet   Oral   Take 50 mg by mouth daily.          [name] omeprazole (PRILOSEC) 20 MG capsule   Oral   Take 20 mg by mouth every morning.         . pravastatin (PRAVACHOL) 10 MG tablet   Oral   Take 10 mg by mouth at bedtime.         [name] rOPINIRole (REQUIP) 4 MG tablet   Oral   Take 4-6 mg by mouth at bedtime.         . triamterene-hydrochlorothiazide (MAXZIDE-25) 37.5-25 MG per tablet   Oral   Take 1 tablet by mouth every morning.         [name] HYDROcodone-acetaminophen (NORCO) 10-325 MG per tablet   Oral   Take  1 tablet by mouth every 4 (four) hours as needed for pain.   40 tablet   0   . HYDROcodone-acetaminophen (NORCO/VICODIN) 5-325 MG per tablet   Oral   Take 2 tablets by mouth every 4 (four) hours as needed.   10 tablet   0   . ibuprofen (ADVIL,MOTRIN) 800 MG tablet   Oral   Take 1 tablet (800 mg  total) by mouth 3 (three) times daily.   21 tablet   0   . VENTOLIN HFA 108 (90 BASE) MCG/ACT inhaler   Inhalation   Inhale 1 puff into the lungs as needed for wheezing or shortness of breath.           BP 137/85  Pulse 86  Temp(Src) 98 F (36.7 C) (Oral)  Resp 20  Ht 5\' 5"  (1.651 m)  Wt 227 lb (102.967 kg)  BMI 37.77 kg/m2  SpO2 93% Physical Exam  Constitutional: He is oriented to person, place, and time. He appears well-developed and well-nourished. No distress.  HENT:  Head: Normocephalic and atraumatic.  Mouth/Throat: Oropharynx is clear and moist. No oropharyngeal exudate.  Eyes: Conjunctivae and EOM are normal. Pupils are equal, round, and reactive to light.  Neck: Normal range of motion. Neck supple.  Cardiovascular: Normal rate, regular rhythm, normal heart sounds and intact distal pulses.   Equal radial pulses, equal grip strengths  Pulmonary/Chest: Effort normal and breath sounds normal. No respiratory distress. He exhibits tenderness.  Tenderness to palpation of the left chest wall without obvious asymmetry, edema or erythema.   Abdominal: Soft. There is no tenderness. There is no rebound and no guarding.  Musculoskeletal: Normal range of motion. He exhibits no edema and no tenderness.  Equal grip strengths bilaterally. No asymmetry to arms.  Neurological: He is alert and oriented to person, place, and time. No cranial nerve deficit. He exhibits normal muscle tone. Coordination normal.  Skin: Skin is warm.    ED Course  Procedures (including critical care time) Labs Review Labs Reviewed  COMPREHENSIVE METABOLIC PANEL - Abnormal; Notable for the following:    Sodium 133 (*)    Chloride 94 (*)    Glucose, Bld 348 (*)    BUN 33 (*)    Creatinine, Ser 1.54 (*)    Total Bilirubin 0.1 (*)    GFR calc non Af Amer 47 (*)    GFR calc Af Amer 54 (*)    All other components within normal limits  GLUCOSE, CAPILLARY - Abnormal; Notable for the following:     Glucose-Capillary 247 (*)    All other components within normal limits  GLUCOSE, CAPILLARY - Abnormal; Notable for the following:    Glucose-Capillary 253 (*)    All other components within normal limits  CBC WITH DIFFERENTIAL  TROPONIN I  PROTIME-INR  PRO B NATRIURETIC PEPTIDE  TROPONIN I   Imaging Review Dg Chest 2 View  12/13/2012   CLINICAL DATA:  Chest pain and shortness of breath  EXAM: CHEST  2 VIEW  COMPARISON:  04/19/2012  FINDINGS: The heart size and mediastinal contours are within normal limits. Both lungs are clear. The visualized skeletal structures are unremarkable.  IMPRESSION: No active cardiopulmonary disease.   Electronically Signed   By: Alcide Clever M.D.   On: 12/13/2012 17:48   Ct Angio Chest Pe W/cm &/or Wo Cm  12/13/2012   CLINICAL DATA:  Shortness of breath, lymphadenopathy  EXAM: CT ANGIOGRAPHY CHEST WITH CONTRAST  TECHNIQUE: Multidetector CT imaging of the chest  was performed using the standard protocol during bolus administration of intravenous contrast. Multiplanar CT image reconstructions including MIPs were obtained to evaluate the vascular anatomy.  CONTRAST:  OMNIPAQUE IOHEXOL 300 MG/ML  SOLN  COMPARISON:  None.  FINDINGS: The lungs are well-aerated without focal infiltrate or sizable effusion. The hilar and mediastinal structures show the thoracic aortic 2 be within normal limits. Moderate coronary calcification is noted. Opacification of the pulmonary artery is somewhat limited although no gross pulmonary emboli are seen. No hilar or mediastinal adenopathy is seen. Multiple chest wall collaterals are noted on the right. This appears to be related to a focal stenosis of the right subclavian vein at its junction with the right internal jugular vein. This is best visualized on the coronal reconstructions.  The visualized upper abdomen is within normal limits. The osseous structures show degenerative change of thoracic spine.  Review of the MIP images confirms  the above findings.  IMPRESSION: No definitive pulmonary emboli although the opacification of the pulmonary arteries is somewhat limited.  Findings consistent with right subclavian vein stenosis as described.  No other focal abnormality is noted.   Electronically Signed   By: Alcide Clever M.D.   On: 12/13/2012 19:24   US Venous Img Upper Uni Left  12/13/2012   CLINICAL DATA:  Lymphadenopathy  EXAM: Left UPPER EXTREMITY VENOUS DOPPLER ULTRASOUND  TECHNIQUE: Gray-scale sonography with graded compression, as well as color Doppler and duplex ultrasound were performed to evaluate the upper extremity deep venous system from the level of the subclavian vein and including the jugular, axillary, basilic and upper cephalic vein. Spectral Doppler was utilized to evaluate flow at rest and with distal augmentation maneuvers.  COMPARISON:  None.  FINDINGS: Thrombus within deep veins:  None visualized.  Compressibility of deep veins:  Normal.  Duplex waveform respiratory phasicity:  Normal.  Duplex waveform response to augmentation:  Normal.  Venous reflux:  None visualized.  Other findings:  None visualized.  IMPRESSION: No evidence of left upper extremity deep venous thrombosis.   Electronically Signed   By: Alcide Clever M.D.   On: 12/13/2012 18:51    EKG Interpretation    Date/Time:  Friday December 13 2012 19:26:39 EST Ventricular Rate:  88 PR Interval:  156 QRS Duration: 90 QT Interval:  372 QTC Calculation: 450 R Axis:   -29 Text Interpretation:  Normal sinus rhythm Normal ECG When compared with ECG of 20-Apr-2012 04:23, No significant change was found No significant change was found Confirmed by Manus Gunning  MD, Kraig Genis ([id number]) on 12/13/2012 7:39:57 PM            MDM   1. Chest wall pain   2. Hyperglycemia    History of DVT and PE presenting with subjective swelling chest wall for the past 2 weeks. Patient appears to be comfortable but has tenderness to palpation of his left chest wall. There is  no appreciable asymmetry, edema or fluctuance.  Hyperglycemia without DKA. Chest x-ray negative. Left upper and mid-upper negative for DVT.  Sugar improved to 247. No evidence of DKA. CT is negative for PE. Incidental finding of right subclavian vein stenosis discussed with Dr. Imogene Burn of vascular surgery. He agrees no need for emergent or elective intervention as this patient is asymptomatic.  No appreciable chest wall swelling. Patient is tender to palpation of his left chest wall. We'll treat with anti-inflammatories. Followup with PCP this week.  Glynn Octave, MD 12/13/12 2308

## 2012-12-20 ENCOUNTER — Ambulatory Visit: Payer: Medicare Other | Admitting: Family Medicine

## 2013-01-31 ENCOUNTER — Other Ambulatory Visit: Payer: Self-pay | Admitting: Family Medicine

## 2013-01-31 ENCOUNTER — Other Ambulatory Visit: Payer: Self-pay | Admitting: Cardiology

## 2013-01-31 ENCOUNTER — Other Ambulatory Visit: Payer: Self-pay | Admitting: Cardiovascular Disease

## 2013-02-03 DIAGNOSIS — H2510 Age-related nuclear cataract, unspecified eye: Secondary | ICD-10-CM | POA: Diagnosis not present

## 2013-02-03 DIAGNOSIS — H2589 Other age-related cataract: Secondary | ICD-10-CM | POA: Diagnosis not present

## 2013-02-03 DIAGNOSIS — E119 Type 2 diabetes mellitus without complications: Secondary | ICD-10-CM | POA: Diagnosis not present

## 2013-02-10 ENCOUNTER — Telehealth: Payer: Self-pay | Admitting: Neurology

## 2013-02-10 NOTE — Telephone Encounter (Signed)
Pharmacy has a refill on file, prior Paul Baker is needed for new ins.  I submitted all requested info to ins, pending response.

## 2013-02-10 NOTE — Telephone Encounter (Signed)
Patient calling regarding needing prior authorization for Nuvigil called to [facility] [phone].

## 2013-02-10 NOTE — Telephone Encounter (Signed)
Patient requesting refill  Thanks

## 2013-02-11 ENCOUNTER — Telehealth: Payer: Self-pay | Admitting: Neurology

## 2013-02-11 DIAGNOSIS — D485 Neoplasm of uncertain behavior of skin: Secondary | ICD-10-CM | POA: Diagnosis not present

## 2013-02-11 DIAGNOSIS — L570 Actinic keratosis: Secondary | ICD-10-CM | POA: Diagnosis not present

## 2013-02-11 DIAGNOSIS — L988 Other specified disorders of the skin and subcutaneous tissue: Secondary | ICD-10-CM | POA: Diagnosis not present

## 2013-02-11 DIAGNOSIS — Z85828 Personal history of other malignant neoplasm of skin: Secondary | ICD-10-CM | POA: Diagnosis not present

## 2013-02-11 DIAGNOSIS — L820 Inflamed seborrheic keratosis: Secondary | ICD-10-CM | POA: Diagnosis not present

## 2013-02-11 NOTE — Telephone Encounter (Signed)
We have already submitted all info to ins (please see note from yesterday).  I called ins to see if they have made a determination yet.  Spoke with [place].  She looked into the request.  She said the patient terminated their coverage with [employer] on 02/01/2013.  I called the pharmacy to see if they had another ins on file for the patient (they provided [employer] previously, which is why we sent the request to them). She said they have [employer] ID # [id number] BIN [id number] Group [id number].  They do not have a phone number for this ins.  I looked the ins up online by the [facility] and it is for [employer].  I called them at [phone].  Spoke with Sonia Baller.  She located the patient, then placed me on hold for several minutes.  After a few minutes, the call disconnected.  I called them back again.  Spoke with [place].  She located the patient then placed me on hold.  I was then transferred to [place].  He placed me on hold, and came back to the line saying they have never serviced this type of client before Pearlie Oyster and [place]) and he isn't sure they can assist.  He placed me on hold again, then came back to the line and said they do not service this patient and they do not know who does.  Says they cannot do prior auth and cannot direct me to anyone who would do PA for this patient.  I called the patient to ask for current ins info.  Got no answer.  Left message asking that he call us back with contact number for ins.  Unfortunately, I cannot proceed with PA until I have the correct ins info.

## 2013-02-11 NOTE — Telephone Encounter (Signed)
Patient calling to state that his pharmacy is still waiting on prior authorization for Nuvigil. Patient states he has been waiting since last Thursday and has been out for the past couple of days. Patient is wondering if it can't be done today if he can come and get some samples. Please advise.

## 2013-02-11 NOTE — Telephone Encounter (Signed)
I called an alternate number I found online, [phone]. Spoke with [employer].  She very kindly looked this patient up and helped me with the prior auth.  I provided all clinical info over the phone and she was able to authorize an exception.  She indicates they will be sending [place] a letter of approval with the case number and approval dates.  I called the pharmacy.  Spoke with Tripp.  Asked him to process the Rx to ensure it goes through okay.  He ran the prescription and said the ins did pay without any issues.  I called the patient back to advise.  Got no answer.  Left message.

## 2013-02-17 ENCOUNTER — Encounter: Payer: Self-pay | Admitting: Family Medicine

## 2013-02-17 ENCOUNTER — Ambulatory Visit (INDEPENDENT_AMBULATORY_CARE_PROVIDER_SITE_OTHER): Payer: Medicare Other | Admitting: Family Medicine

## 2013-02-17 VITALS — BP 138/80 | Temp 98.7°F | Ht 65.0 in | Wt 236.0 lb

## 2013-02-17 DIAGNOSIS — J329 Chronic sinusitis, unspecified: Secondary | ICD-10-CM

## 2013-02-17 MED ORDER — LEVOFLOXACIN 500 MG PO TABS: 500.0000 mg | ORAL_TABLET | Freq: Every day | ORAL | Status: AC

## 2013-02-17 NOTE — Progress Notes (Signed)
   Subjective:    Patient ID: Paul Baker, male    DOB: [birth year], 63 y.o.   MRN: [id number]  Cough This is a new problem. The current episode started in the past 7 days. Associated symptoms include headaches. Associated symptoms comments: Runny nose. Treatments tried: mucinex.    Stuff y mucus and a lot of congestion  Some prod cough  No fever Low gr fever  oleta wss sick  Frontal sinus and cong    Review of Systems  Respiratory: Positive for cough.   Neurological: Positive for headaches.   no vomiting no diarrhea no rash ROS otherwise negative    Objective:   Physical Exam  Alert mild malaise. Frontal maxillary tenderness. Positive nasal congestion neck supple. Lungs clear heart regular in rhythm.      Assessment & Plan:  Impression acute rhinosinusitis plan Levaquin 500 daily 10 days. Symptomatic care discussed. Ventolin when necessary. WSL

## 2013-02-21 ENCOUNTER — Telehealth: Payer: Self-pay | Admitting: Neurology

## 2013-02-21 NOTE — Telephone Encounter (Signed)
We previously sent a refill to the pharmacy, and they should have that on file.  I called the pharmacy.  Spoke with Tripp.  He viewed the patients file and said they do have one refill.  A new Rx will be needed for next fill.  They will contact the patient when the Rx is ready.

## 2013-02-24 ENCOUNTER — Other Ambulatory Visit: Payer: Self-pay

## 2013-02-24 MED ORDER — ARMODAFINIL 50 MG PO TABS
100.0000 mg | ORAL_TABLET | Freq: Every day | ORAL | Status: DC
Start: 2013-02-24 — End: 2013-04-02

## 2013-02-24 NOTE — Telephone Encounter (Signed)
Rx signed and faxed.

## 2013-03-18 ENCOUNTER — Ambulatory Visit: Payer: Medicare Other | Admitting: Nurse Practitioner

## 2013-04-01 ENCOUNTER — Other Ambulatory Visit: Payer: Self-pay | Admitting: Family Medicine

## 2013-04-02 ENCOUNTER — Ambulatory Visit (INDEPENDENT_AMBULATORY_CARE_PROVIDER_SITE_OTHER): Payer: Medicare Other | Admitting: Nurse Practitioner

## 2013-04-02 ENCOUNTER — Encounter: Payer: Self-pay | Admitting: Nurse Practitioner

## 2013-04-02 VITALS — BP 127/70 | HR 80 | Wt 206.0 lb

## 2013-04-02 DIAGNOSIS — G4733 Obstructive sleep apnea (adult) (pediatric): Secondary | ICD-10-CM | POA: Diagnosis not present

## 2013-04-02 DIAGNOSIS — G2581 Restless legs syndrome: Secondary | ICD-10-CM | POA: Diagnosis not present

## 2013-04-02 DIAGNOSIS — G4710 Hypersomnia, unspecified: Secondary | ICD-10-CM

## 2013-04-02 DIAGNOSIS — G4730 Sleep apnea, unspecified: Secondary | ICD-10-CM

## 2013-04-02 DIAGNOSIS — G253 Myoclonus: Secondary | ICD-10-CM | POA: Insufficient documentation

## 2013-04-02 MED ORDER — CLONAZEPAM 1 MG PO TABS: 0.5000 mg | ORAL_TABLET | Freq: Every evening | ORAL | Status: DC | PRN

## 2013-04-02 MED ORDER — ROPINIROLE HCL 4 MG PO TABS: 4.0000 mg | ORAL_TABLET | Freq: Every day | ORAL | Status: DC

## 2013-04-02 MED ORDER — ARMODAFINIL 50 MG PO TABS: 100.0000 mg | ORAL_TABLET | ORAL | Status: DC

## 2013-04-02 NOTE — Patient Instructions (Signed)
We will try to increase you Nuvigil to 2 tablets in the am and 1 tablet in the mid-afternoon.  Continue Requip and Klonopin at current dose.  Follow up with Dr. Brett Fairy in 4 months.

## 2013-04-02 NOTE — Progress Notes (Signed)
PATIENT: Paul Baker DOB: 01/13/1950  REASON FOR VISIT: follow up for OSA on CPAP, Hypersomnia, persistent HISTORY FROM: patient  HISTORY OF PRESENT ILLNESS: UPDATE 04/02/13 (LL): Paul Baker returns for follow up visit.  He brings his Cpap machine today and the download provides a reprot for 25 days.  Compliance is around 50% He has reduced compliance this time because he was in the hospitital twice for hyperglycemia and also he states his mask is not fitting well, but he does use it religiously.  He plans on getting a new one soon. He states that [name] is wearing off in the mid afternoons, around 2-3 pm.  He is so sleepy that he has fallen asleep during conversation. His Epworth Score today is 22. FSS is 38.  05/31/12 (CD):The patient returns today for a revisit. He has been using a CPAP With variable pressure settings. I reviewed his CPAP a little titration from the date 04/05/2012 through 05/04/2012. His 95th percentile pressure is 14 cm, his AHI is 0.6. I consider his apnea optimally treated. Average daily usages 5 hours and 20 minutes for the last 30 days he would be 100% compliant by Medicare criteria.  Annual download dated 7-17 2014 for a 35 day. Shows that the patient used 100% of all recorded base CPAP with a minimum pressure of 6 cm maximum pressure of 16 cm in his 95th percentile pressure was 14.6 cm water his residual AHI is 1.2. Average usage time is 5 hours 6 minutes. It is my impression that the patient apnea cannot be responsible for his daytime sleepiness anymore. We are now discussing possible narcolepsy versus severe impairment from the restless legs causing this excessive daytime sleepiness. I have tried the patient on NUPRO Patch at 2 strength , each did not help him to fall asleep easier or maintain sleep.  Epworth sleepiness score of was endorsed still at 20 points ( while using CPAP). Today it is 18 points , still highly abnormal on high dose dopaminergic  medication. He endorses RLS with myoclonus , and may respond to Klonipin. .  FSS 27 Points.  The patient had a severe nasal sinus infection on 08-15-12 MRSA .  Propranolol at 4-6 mg at bedtime, and lorazepam 1 mg at bedtime have still not controlled to restless legs and the resulting sleep difficulties the patient continues to feel severely impaired and is quality of life and quality of sleep by restless legs and less so by apnea. He likes his auto PAP , it works well for him. He had no longer morning headaches as he used to, and has rare nocturia.  The patient, who also has restless leg syndrome, endorsed a moderate severity of restless left leg symptoms at night which will which could contribute to his residual high degree of sleepiness  .Please review the attached study reports in Epic.  This patient with severe obstructive sleep apnea followed by Paul Baker. AHI 62 titrated to 16 cm water. DME was [employer] , PCP Dr.-Luking.  Apnea cannot longer be the cause for his excessive daytime sleepiness, I will try to adjust his medication treatment for restless leg syndrome.  In addition given the clinical presentation of associated vivid dreams and some sleep hallucinations and dream intrusions, it was advisable to perform MS LT to evaluate for narcolepsy or related sleep disorder is causing the persistent hypersomnia. His MSL T. date at 5-9 2014 showed a mean sleep latency of 2.3 minutes REM latency 8.5 minutes is  1 sleep onset REM.  Total sleep time preceding this test 349 minutes sleep was severely interrupted by periodic limb movements.  Based on these results the patient will change from ativan to [facility] at night, and I would prescribe a stimulant to help him stay awake and daytime. This patient has known coronary artery disease. Pending his cardiologist to agree or disagree with a medication, I suggested Nuvigil. Dr. Sharon Seller . Renaxa can't be used with NUVIGIL and he was weaned off Renaxa. He  should be able to use it now.  Obtain an anemia panel to complete has review of metabolic factors contributing to restless leg syndrome. The patient is aware that his risk factors include thyroid disease, diabetic neuropathy, but also had worsening of restless legs after a knee surgery possibly due to blood loss.  100% compliance with CPAP. As discussed during visit. Normal FE , ferritin levels.   REVIEW OF SYSTEMS: Full 14 system review of systems performed and notable only for:  Facial swelling, hearing loss, ringing in the ears, eye pain, cough, chest pain, leg swelling, excessive thirst, abdominal pain, restless legs, apnea, frequent waking, daytime sleepiness, snoring, sleep talking, sleep walking, acting out dreams, joint pain, joint swelling, back pain, aching muscles, muscle cramps, walking difficulty, coordination problems, dizziness, headache  ALLERGIES: Allergies  Allergen Reactions  . Cardizem [Diltiazem Hcl]     Edema   . Cardura [Doxazosin Mesylate]     Side effects  . Lipitor [Atorvastatin] Other (See Comments)    Leg cramps  . Lotensin [Benazepril Hcl]     Side effects   . Paxil [Paroxetine Hcl]     Side effects   . Adhesive [Tape] Rash  . Codeine Rash and Other (See Comments)    Headache     HOME MEDICATIONS: Outpatient Prescriptions Prior to Visit  Medication Sig Dispense Refill  . amLODipine (NORVASC) 5 MG tablet TAKE ONE TABLET DAILY.  30 tablet  6  . aspirin 325 MG tablet Take 325 mg by mouth every evening.       . Canagliflozin (INVOKANA) 100 MG TABS Take 100 mg by mouth every morning.       . clobetasol (TEMOVATE) 0.05 % external solution Apply 1 application topically daily as needed. For irritation      . DUREZOL 0.05 % EMUL Place 1 drop into the left eye daily. Course was completed today      . fenofibrate 160 MG tablet TAKE (1) TABLET BY MOUTH AT BEDTIME.  30 tablet  5  . HYDROcodone-acetaminophen (NORCO) 10-325 MG per tablet Take 1 tablet by mouth every  4 (four) hours as needed for pain.  40 tablet  0  . HYDROcodone-acetaminophen (NORCO/VICODIN) 5-325 MG per tablet Take 2 tablets by mouth every 4 (four) hours as needed.  10 tablet  0  . ibuprofen (ADVIL,MOTRIN) 800 MG tablet Take 1 tablet (800 mg total) by mouth 3 (three) times daily.  21 tablet  0  . isosorbide mononitrate (IMDUR) 60 MG 24 hr tablet Take 30 mg by mouth 2 (two) times daily.      [name] LEVEMIR 100 UNIT/ML injection Inject 60 Units into the skin 2 (two) times daily.       [name] loratadine (CLARITIN) 10 MG tablet Take 10 mg by mouth at bedtime.      [name] losartan (COZAAR) 100 MG tablet Take 100 mg by mouth every morning.      . metFORMIN (GLUCOPHAGE) 500 MG tablet Take 1,000 mg by mouth 2 (two)  times daily with a meal.      . metoprolol succinate (TOPROL-XL) 50 MG 24 hr tablet Take 50 mg by mouth 2 (two) times daily. Take with or immediately following a meal.      . Multiple Vitamins-Minerals (MULTIVITAMIN WITH MINERALS) tablet Take 1 tablet by mouth daily.        [name] NITROQUICK 0.4 MG SL tablet DISSOLVE 1 TABLET UNDER TONGUE EVERY 5 MINUTES UP TO 15 MIN FOR CHESTPAIN. IF NO RELIEF CALL 911.  25 each  3  . omeprazole (PRILOSEC) 20 MG capsule Take 20 mg by mouth every morning.      . pravastatin (PRAVACHOL) 10 MG tablet TAKE ONE TABLET BY MOUTH ONCE DAILY.  90 tablet  0  . triamterene-hydrochlorothiazide (MAXZIDE-25) 37.5-25 MG per tablet Take 1 tablet by mouth every morning.      . VENTOLIN HFA 108 (90 BASE) MCG/ACT inhaler Inhale 1 puff into the lungs as needed for wheezing or shortness of breath.       . Armodafinil (NUVIGIL) 50 MG tablet Take 2 tablets (100 mg total) by mouth daily.  60 tablet  3  . clonazePAM (KLONOPIN) 1 MG tablet Take 0.5 mg by mouth at bedtime as needed and may repeat dose one time if needed (for RLS).      [name] rOPINIRole (REQUIP) 4 MG tablet Take 4-6 mg by mouth at bedtime.      . insulin lispro protamine-lispro (HUMALOG 50/50) (50-50) 100 UNIT/ML SUSP injection Inject  40-45 Units into the skin 2 (two) times daily before a meal. Takes 40 units in the morning and 45 units in the evening       No facility-administered medications prior to visit.     PHYSICAL EXAM  Filed Vitals:   04/02/13 1018  BP: 127/70  Pulse: 80  Weight: 206 lb (93.441 kg)   Body mass index is 34.28 kg/(m^2).  General: The patient is awake, alert and appears not in acute distress. The patient is well groomed.  Head: Normocephalic, atraumatic. Neck is supple. Cardiovascular: Regular rate and rhythm, without murmurs or carotid bruit, and without distended neck veins.  Respiratory: Lungs are clear to auscultation.  Skin: Without evidence of edema, or rash  Trunk: BMI is elevated , patient has normal posture.   Neurologic exam :  The patient is awake and alert, oriented to place and time. Memory subjective described as intact. There is a normal attention span & concentration ability. Mood and affect are appropriate.  Cranial nerves:  Pupils are equal and briskly reactive to light. . Extraocular movements in vertical and horizontal planes intact and without nystagmus. Visual fields by finger perimetry are intact.  Hearing to finger rub intact. Facial sensation intact to fine touch. Facial motor strength is symmetric and tongue and uvula move midline.  Motor exam: Normal tone and normal muscle bulk and symmetric normal strength in all extremities.the patient reports dropping objects, tingling in both hands.  Sensory: Fine touch, pinprick and vibration were tested in all extremities.  Coordination: Rapid alternating movements in the fingers/hands is tested and normal. Finger-to-nose maneuver tested and normal without evidence of ataxia, dysmetria or tremor.  Gait and station: Patient walks without assistive device , climb up to the exam table. Strength within normal limits. Stance is stable and normal.  Reflexes: Attenuated Reflexes, unusual for a patient with documented spinal stenosis.      ASSESSMENT AND PLAN OSA - treated on CPAP with very good result for sleep apnea, documented in  general CPAP downloads the last one from 7-17 2014. Residual AHI was 1.2.   Restless leg syndrome with myoclonus, has not been responding well to increased doses of dopaminergic medication.   The patient will now be treated with Klonopin and to be taken 2 hours before bedtime a second dose can be used in the in the night if he awakens. He is to take Nuvigil 100 mg in the am and try taking an additional tablet in the mid-afternoon when he feels it wearing off. He is to continue the use of CPAP and actually likes the CPAP.  Spinal stenosis contributes to RLS.  Father and daughter have RLS with Myoclonus.    Meds ordered this encounter  Medications  . Armodafinil (NUVIGIL) 50 MG tablet    Sig: Take 2 tablets (100 mg total) by mouth See admin instructions. Take 2 tablets in the am and 1 additional tablet in the mid-afternoon.    Dispense:  90 tablet    Refill:  5    Pharmacy Fax [phone]    Order Specific Question:  Supervising Provider    Answer:  Brett Fairy, CARMEN [[id number]]  . clonazePAM (KLONOPIN) 1 MG tablet    Sig: Take 0.5 tablets (0.5 mg total) by mouth at bedtime as needed and may repeat dose one time if needed (for RLS).    Dispense:  60 tablet    Refill:  5    Order Specific Question:  Supervising Provider    Answer:  DOHMEIER, CARMEN [[id number]]  . rOPINIRole (REQUIP) 4 MG tablet    Sig: Take 1-1.5 tablets (4-6 mg total) by mouth at bedtime.    Dispense:  45 tablet    Refill:  5    Order Specific Question:  Supervising Provider    Answer:  Penni Bombard [[id number]]   Return in about 6 months (around 10/02/2013).  Philmore Pali, MSN, NP-C 04/02/2013, 11:02 AM [facility] [address] [place], [place] [address] [phone]  Note: This document was prepared with digital dictation and possible smart phrase technology. Any transcriptional errors that result  from this process are unintentional.

## 2013-04-03 NOTE — Progress Notes (Signed)
I agree with the assessment and plan as directed by NP .The patient is known to me .   Jojo Pehl, MD  

## 2013-04-14 ENCOUNTER — Ambulatory Visit (INDEPENDENT_AMBULATORY_CARE_PROVIDER_SITE_OTHER): Payer: Medicare Other | Admitting: Family Medicine

## 2013-04-14 ENCOUNTER — Encounter: Payer: Self-pay | Admitting: Family Medicine

## 2013-04-14 VITALS — BP 128/80 | Temp 98.7°F | Ht 64.0 in | Wt 229.0 lb

## 2013-04-14 DIAGNOSIS — I889 Nonspecific lymphadenitis, unspecified: Secondary | ICD-10-CM

## 2013-04-14 DIAGNOSIS — IMO0001 Reserved for inherently not codable concepts without codable children: Secondary | ICD-10-CM | POA: Diagnosis not present

## 2013-04-14 DIAGNOSIS — I10 Essential (primary) hypertension: Secondary | ICD-10-CM | POA: Diagnosis not present

## 2013-04-14 DIAGNOSIS — E785 Hyperlipidemia, unspecified: Secondary | ICD-10-CM | POA: Diagnosis not present

## 2013-04-14 MED ORDER — AMOXICILLIN-POT CLAVULANATE 875-125 MG PO TABS: 1.0000 | ORAL_TABLET | Freq: Two times a day (BID) | ORAL | Status: AC

## 2013-04-14 NOTE — Progress Notes (Signed)
   Subjective:    Patient ID: Paul Baker, male    DOB: January 24, 1950, 63 y.o.   MRN: [id number]  Cough This is a new problem. The current episode started in the past 7 days. Associated symptoms include chest pain, a fever, headaches, a sore throat and shortness of breath. Associated symptoms comments: Jaw pain, congestion.   Bad sore throat  Cough significantly worse  does not feel like just allergy   Review of Systems  Constitutional: Positive for fever.  HENT: Positive for sore throat.   Respiratory: Positive for cough and shortness of breath.   Cardiovascular: Positive for chest pain.  Neurological: Positive for headaches.       Objective:   Physical Exam  Alert moderate malaise frontal maxillary tenderness pharynx slight erythema neck tender anterior nodes supple lungs clear no wheezes currently heart regular in rhythm.      Assessment & Plan:  Impression lymphadenitis with bronchitis plan Augmentin twice a day 10 days. Ventolin when necessary. Symptomatic care discussed. WSL

## 2013-04-16 DIAGNOSIS — M1710 Unilateral primary osteoarthritis, unspecified knee: Secondary | ICD-10-CM | POA: Diagnosis not present

## 2013-04-16 DIAGNOSIS — M25579 Pain in unspecified ankle and joints of unspecified foot: Secondary | ICD-10-CM | POA: Diagnosis not present

## 2013-04-18 ENCOUNTER — Other Ambulatory Visit: Payer: Self-pay | Admitting: Family Medicine

## 2013-04-21 DIAGNOSIS — E785 Hyperlipidemia, unspecified: Secondary | ICD-10-CM | POA: Diagnosis not present

## 2013-04-21 DIAGNOSIS — I10 Essential (primary) hypertension: Secondary | ICD-10-CM | POA: Diagnosis not present

## 2013-04-21 DIAGNOSIS — E039 Hypothyroidism, unspecified: Secondary | ICD-10-CM | POA: Diagnosis not present

## 2013-04-21 DIAGNOSIS — IMO0001 Reserved for inherently not codable concepts without codable children: Secondary | ICD-10-CM | POA: Diagnosis not present

## 2013-05-05 ENCOUNTER — Ambulatory Visit: Payer: Medicare Other | Admitting: Cardiology

## 2013-05-06 ENCOUNTER — Other Ambulatory Visit: Payer: Self-pay | Admitting: Cardiology

## 2013-05-12 ENCOUNTER — Encounter: Payer: Self-pay | Admitting: Cardiology

## 2013-05-12 ENCOUNTER — Ambulatory Visit (INDEPENDENT_AMBULATORY_CARE_PROVIDER_SITE_OTHER): Payer: Medicare Other | Admitting: Cardiology

## 2013-05-12 VITALS — BP 146/82 | HR 99 | Ht 65.0 in | Wt 226.4 lb

## 2013-05-12 DIAGNOSIS — IMO0001 Reserved for inherently not codable concepts without codable children: Secondary | ICD-10-CM

## 2013-05-12 DIAGNOSIS — IMO0002 Reserved for concepts with insufficient information to code with codable children: Secondary | ICD-10-CM

## 2013-05-12 DIAGNOSIS — I10 Essential (primary) hypertension: Secondary | ICD-10-CM

## 2013-05-12 DIAGNOSIS — I2510 Atherosclerotic heart disease of native coronary artery without angina pectoris: Secondary | ICD-10-CM | POA: Diagnosis not present

## 2013-05-12 DIAGNOSIS — E785 Hyperlipidemia, unspecified: Secondary | ICD-10-CM | POA: Diagnosis not present

## 2013-05-12 DIAGNOSIS — E1165 Type 2 diabetes mellitus with hyperglycemia: Secondary | ICD-10-CM

## 2013-05-12 NOTE — Assessment & Plan Note (Signed)
No change in regimen for now. If blood pressure trend continues to go up, could always advance Norvasc further, also to serve as an antianginal.

## 2013-05-12 NOTE — Assessment & Plan Note (Signed)
Continue medical therapy and observation for now. Plan to schedule three-month visit, can discuss with him probable followup Cardiolite around that time for preoperative assessment prior to knee surgery.

## 2013-05-12 NOTE — Assessment & Plan Note (Signed)
He continues on Pravachol. 

## 2013-05-12 NOTE — Assessment & Plan Note (Signed)
Keep followup with Dr. Nida. 

## 2013-05-12 NOTE — Progress Notes (Signed)
Clinical Summary Paul Baker is a 63 y.o.male last seen in October 2014. He reports occasional angina symptoms and nitroglycerin use, states that he has been compliant with his medications. He got away from his regular exercise regimen for about 6 weeks when he was in [place] helping to care for his ailing brother. He is just now getting back to the gym.  Lab work from December 2014 showed cholesterol 190, HDL 39, triglycerides 141, LDL 113.  He may be in need of right knee surgery sometime around the end of the summer. Cardiac catheterization back in December 2011 demonstrated a 90% proximal RCA stenosis in a small nondominant vessel, otherwise no significant flow limiting left-sided disease - only 20% proximal LAD noted. He has been managed medically.   Allergies  Allergen Reactions  . Cardizem [Diltiazem Hcl]     Edema   . Cardura [Doxazosin Mesylate]     Side effects  . Lipitor [Atorvastatin] Other (See Comments)    Leg cramps  . Lotensin [Benazepril Hcl]     Side effects   . Paxil [Paroxetine Hcl]     Side effects   . Adhesive [Tape] Rash  . Codeine Rash and Other (See Comments)    Headache     Current Outpatient Prescriptions  Medication Sig Dispense Refill  . amLODipine (NORVASC) 5 MG tablet TAKE ONE TABLET DAILY.  30 tablet  6  . Armodafinil (NUVIGIL) 50 MG tablet Take 2 tablets (100 mg total) by mouth See admin instructions. Take 2 tablets in the am and 1 additional tablet in the mid-afternoon.  90 tablet  5  . aspirin 325 MG tablet Take 325 mg by mouth every evening.       . Canagliflozin (INVOKANA) 100 MG TABS Take 100 mg by mouth every morning.       . clobetasol (TEMOVATE) 0.05 % external solution Apply 1 application topically daily as needed. For irritation      . clonazePAM (KLONOPIN) 1 MG tablet Take 0.5 tablets (0.5 mg total) by mouth at bedtime as needed and may repeat dose one time if needed (for RLS).  60 tablet  5  . fenofibrate 160 MG tablet TAKE (1)  TABLET BY MOUTH AT BEDTIME.  30 tablet  5  . HUMALOG 100 UNIT/ML injection 20 Units 2 (two) times daily. Patient is on a sliding scale      . HYDROcodone-acetaminophen (NORCO) 10-325 MG per tablet Take 1 tablet by mouth every 4 (four) hours as needed for pain.  40 tablet  0  . ibuprofen (ADVIL,MOTRIN) 800 MG tablet Take 1 tablet (800 mg total) by mouth 3 (three) times daily.  21 tablet  0  . isosorbide mononitrate (IMDUR) 60 MG 24 hr tablet Take 30 mg by mouth 2 (two) times daily.      [name] LEVEMIR 100 UNIT/ML injection Inject 60 Units into the skin 2 (two) times daily.       [name] levothyroxine (SYNTHROID, LEVOTHROID) 75 MCG tablet daily.      [name] loratadine (CLARITIN) 10 MG tablet Take 10 mg by mouth at bedtime.      [name] losartan (COZAAR) 100 MG tablet Take 100 mg by mouth every morning.      . metFORMIN (GLUCOPHAGE) 500 MG tablet TAKE 2 TABLETS EACH MORNING, 1 TABLET AT NOON, AND 2 TABLETS EVERY EVENING.  150 tablet  0  . metoprolol succinate (TOPROL-XL) 50 MG 24 hr tablet Take 50 mg by mouth 2 (two) times daily. Take with  or immediately following a meal.      . Multiple Vitamins-Minerals (MULTIVITAMIN WITH MINERALS) tablet Take 1 tablet by mouth daily.        [name] NITROQUICK 0.4 MG SL tablet DISSOLVE 1 TABLET UNDER TONGUE EVERY 5 MINUTES UP TO 15 MIN FOR CHESTPAIN. IF NO RELIEF CALL 911.  25 each  3  . omeprazole (PRILOSEC) 20 MG capsule Take 20 mg by mouth every morning.      . pravastatin (PRAVACHOL) 10 MG tablet TAKE ONE TABLET BY MOUTH ONCE DAILY.  90 tablet  3  . rOPINIRole (REQUIP) 4 MG tablet Take 1-1.5 tablets (4-6 mg total) by mouth at bedtime.  45 tablet  5  . triamterene-hydrochlorothiazide (MAXZIDE-25) 37.5-25 MG per tablet Take 1 tablet by mouth every morning.      . VENTOLIN HFA 108 (90 BASE) MCG/ACT inhaler Inhale 1 puff into the lungs as needed for wheezing or shortness of breath.        No current facility-administered medications for this visit.    Past Medical History  Diagnosis Date    . Arthritis   . Type 2 diabetes mellitus   . Mixed hyperlipidemia   . Essential hypertension, benign   . Septic arthritis of knee, left   . OSA (obstructive sleep apnea)   . Coronary atherosclerosis of native coronary artery     Diseased nondominant RCA  . Pulmonary embolism 2004  . DVT (deep venous thrombosis)   . Asthma   . Rotator cuff disorder   . Colon polyp   . Morbid obesity   . RLS (restless legs syndrome)   . Hypersomnia     CPAP of 16 cm, diagnosed with AHI of 60 in 2012,epworth 21- narcolepsy?  . Cataplexy   . MRSA (methicillin resistant staph aureus) culture positive     08/2012  . Narcolepsy     Social History Paul Baker reports that he quit smoking about 18 years ago. His smoking use included Cigarettes. He smoked 0.00 packs per day. He has never used smokeless tobacco. Paul Baker reports that he does not drink alcohol.  Review of Systems Chronic knee pain. Otherwise as outlined above.  Physical Examination Filed Vitals:   05/12/13 1044  BP: 146/82  Pulse: 99   Filed Weights   05/12/13 1044  Weight: 226 lb 6.4 oz (102.694 kg)    Obese male in no acute distress without active chest pain.  HEENT: Conjunctiva and lids normal, oropharynx with moist mucosa.  Neck: Supple, no elevated JVP or carotid bruits, no thyromegaly.  Lungs: Clear to auscultation, nonlabored, no wheezing.  Cardiac: Regular rate and rhythm, no S3 gallop or pericardial rub, no significant systolic murmur.  Abdomen: Soft, nontender, bowel sounds present. No bruits.  Extremities: Trace to 1+ edema below the knees bilaterally, distal pulses one plus.  Musculoskeletal: No gross deformities.   Problem List and Plan   CORONARY ATHEROSCLEROSIS NATIVE CORONARY ARTERY Continue medical therapy and observation for now. Plan to schedule three-month visit, can discuss with him probable followup Cardiolite around that time for preoperative assessment prior to knee  surgery.  HYPERLIPIDEMIA He continues on Pravachol.  Type 2 diabetes mellitus, uncontrolled Keep followup with Dr. [name].  Essential hypertension, benign No change in regimen for now. If blood pressure trend continues to go up, could always advance Norvasc further, also to serve as an antianginal.    Satira Sark, M.D., F.A.C.C.

## 2013-05-12 NOTE — Patient Instructions (Signed)
Your physician recommends that you schedule a follow-up appointment in:  3 months    Your physician recommends that you continue on your current medications as directed. Please refer to the Current Medication list given to you today.     Thank you for choosing [facility] !   

## 2013-05-13 ENCOUNTER — Ambulatory Visit (HOSPITAL_COMMUNITY)
Admission: RE | Admit: 2013-05-13 | Discharge: 2013-05-13 | Disposition: A | Discharge status: Home / Self Care | Payer: Medicare Other | Source: Ambulatory Visit | Attending: Family Medicine | Admitting: Family Medicine

## 2013-05-13 ENCOUNTER — Encounter: Payer: Self-pay | Admitting: Family Medicine

## 2013-05-13 ENCOUNTER — Ambulatory Visit (INDEPENDENT_AMBULATORY_CARE_PROVIDER_SITE_OTHER): Payer: Medicare Other | Admitting: Family Medicine

## 2013-05-13 VITALS — BP 136/74 | Ht 65.0 in | Wt 226.0 lb

## 2013-05-13 DIAGNOSIS — R109 Unspecified abdominal pain: Secondary | ICD-10-CM | POA: Insufficient documentation

## 2013-05-13 DIAGNOSIS — I2510 Atherosclerotic heart disease of native coronary artery without angina pectoris: Secondary | ICD-10-CM

## 2013-05-13 LAB — CBC WITH DIFFERENTIAL/PLATELET
Basophils Absolute: 0 10*3/uL (ref 0.0–0.1)
Basophils Relative: 0 % (ref 0–1)
EOS PCT: 4 % (ref 0–5)
Eosinophils Absolute: 0.3 10*3/uL (ref 0.0–0.7)
HEMATOCRIT: 38.1 % — AB (ref 39.0–52.0)
Hemoglobin: 12.7 g/dL — ABNORMAL LOW (ref 13.0–17.0)
LYMPHS ABS: 1.5 10*3/uL (ref 0.7–4.0)
Lymphocytes Relative: 22 % (ref 12–46)
MCH: 29.1 pg (ref 26.0–34.0)
MCHC: 33.3 g/dL (ref 30.0–36.0)
MCV: 87.4 fL (ref 78.0–100.0)
MONO ABS: 0.7 10*3/uL (ref 0.1–1.0)
MONOS PCT: 11 % (ref 3–12)
Neutro Abs: 4.2 10*3/uL (ref 1.7–7.7)
Neutrophils Relative %: 63 % (ref 43–77)
PLATELETS: 286 10*3/uL (ref 150–400)
RBC: 4.36 MIL/uL (ref 4.22–5.81)
RDW: 14.4 % (ref 11.5–15.5)
WBC: 6.7 10*3/uL (ref 4.0–10.5)

## 2013-05-13 LAB — HEPATIC FUNCTION PANEL
ALK PHOS: 65 U/L (ref 39–117)
ALT: 23 U/L (ref 0–53)
AST: 24 U/L (ref 0–37)
Albumin: 3.9 g/dL (ref 3.5–5.2)
Bilirubin, Direct: <0.1 mg/dL (ref 0.0–0.3)
Indirect Bilirubin: 0 mg/dL — ABNORMAL LOW (ref 0.2–1.2)
TOTAL PROTEIN: 7.6 g/dL (ref 6.0–8.3)
Total Bilirubin: 0.1 mg/dL — ABNORMAL LOW (ref 0.3–1.2)

## 2013-05-13 LAB — BASIC METABOLIC PANEL
BUN: 22 mg/dL (ref 6–23)
CALCIUM: 10.1 mg/dL (ref 8.4–10.5)
CO2: 27 mEq/L (ref 19–32)
CREATININE: 1.2 mg/dL (ref 0.50–1.35)
Chloride: 98 mEq/L (ref 96–112)
Glucose, Bld: 128 mg/dL — ABNORMAL HIGH (ref 70–99)
Potassium: 4 mEq/L (ref 3.5–5.3)
Sodium: 139 mEq/L (ref 135–145)

## 2013-05-13 LAB — AMYLASE: Amylase: 60 U/L (ref 0–105)

## 2013-05-13 LAB — LIPASE: LIPASE: 73 U/L — AB (ref 11–59)

## 2013-05-13 MED ORDER — SUCRALFATE 1 G PO TABS: 1.0000 g | ORAL_TABLET | Freq: Three times a day (TID) | ORAL | Status: DC

## 2013-05-13 NOTE — Progress Notes (Signed)
   Subjective:    Patient ID: Paul Baker, male    DOB: 1950/11/17, 63 y.o.   MRN: [id number]  Abdominal Pain This is a new problem. Episode onset: 6 months ago. The pain is located in the RLQ. The quality of the pain is sharp. Associated symptoms include nausea and vomiting. The pain is aggravated by eating.   ruq discomfort and pain, pain not real bad, but yesterday was very painful.  Had nausea no vomiting with it.  Got hurting the other  ruq discomfort and rad to back  No fatty foods, closely, numbers good on sugars,  Still has gallbladder  Still swollen and tender this aft     Review of Systems  Gastrointestinal: Positive for nausea, vomiting and abdominal pain.   dysuria no chest pain ROS otherwise negative     Objective:   Physical Exam  Alert no apparent distress. Lungs clear. Heart regular in rhythm. No CVA tenderness. Positive right upper quadrant no rebound no guarding good bowel sounds      Assessment & Plan:  Impression intermittent right upper quadrant pain with colicky like element. And sensitivity and worse after fatty meals. May represent gallbladder dysfunction. May represent duodenitis etc. with spasm plan right upper quadrant off some. Blood work. Further recommendations based on results. WSL

## 2013-05-27 ENCOUNTER — Other Ambulatory Visit: Payer: Self-pay | Admitting: Family Medicine

## 2013-06-03 ENCOUNTER — Encounter: Payer: Self-pay | Admitting: Family Medicine

## 2013-06-03 ENCOUNTER — Ambulatory Visit (INDEPENDENT_AMBULATORY_CARE_PROVIDER_SITE_OTHER): Payer: Medicare Other | Admitting: Family Medicine

## 2013-06-03 VITALS — BP 128/86 | Ht 65.0 in | Wt 223.6 lb

## 2013-06-03 DIAGNOSIS — I2510 Atherosclerotic heart disease of native coronary artery without angina pectoris: Secondary | ICD-10-CM | POA: Diagnosis not present

## 2013-06-03 DIAGNOSIS — R109 Unspecified abdominal pain: Secondary | ICD-10-CM

## 2013-06-03 NOTE — Progress Notes (Signed)
   Subjective:    Patient ID: Paul Baker, male    DOB: Jul 20, 1950, 63 y.o.   MRN: [id number]  HPI Patient arrives for a follow up on abd pain. Patient states he feels like he has a hernia and had elevated pancreas levels when tested a few weeks ago. Patient states he is still having a lot of pain and problems with abd pain.  Ongoing pain very sharp, hurts bad, severe.  Hurts with change in position  Night time pain some  Does not change with meals w or   Colonoscopy good one yr sgo  Bowels normal.  Pain quite severe at times. Now appointments the right mid lateral abdomen.  Review of Systems No fever no chills no major change in bowel habits no chest pain no headache    Objective:   Physical Exam  Alert no major distress. Vitals reviewed. HEENT normal. Lungs clear. Heart regular in rhythm. Bowel sounds present moderate right lateral abdominal tenderness to deep palpation. No true rebound. Question guarding.      Assessment & Plan:  Impression progressive abdominal pain. Lower than general right upper quadrant colicky type pain. Not particularly associated with meals at this time. Not responding to trial of proton pump inhibitors. Negative colonoscopy recently. Discussed at length. 25 minutes spent most in discussion. Plan press on with CT scan of abdomen rationale discussed. WSL

## 2013-06-05 ENCOUNTER — Ambulatory Visit (HOSPITAL_COMMUNITY)
Admission: RE | Admit: 2013-06-05 | Discharge: 2013-06-05 | Disposition: A | Discharge status: Home / Self Care | Payer: Medicare Other | Source: Ambulatory Visit | Attending: Family Medicine | Admitting: Family Medicine

## 2013-06-05 DIAGNOSIS — R1031 Right lower quadrant pain: Secondary | ICD-10-CM | POA: Diagnosis not present

## 2013-06-11 DIAGNOSIS — M1710 Unilateral primary osteoarthritis, unspecified knee: Secondary | ICD-10-CM | POA: Diagnosis not present

## 2013-06-23 DIAGNOSIS — M1710 Unilateral primary osteoarthritis, unspecified knee: Secondary | ICD-10-CM | POA: Diagnosis not present

## 2013-08-08 ENCOUNTER — Other Ambulatory Visit: Payer: Self-pay | Admitting: Cardiology

## 2013-08-11 ENCOUNTER — Other Ambulatory Visit: Payer: Self-pay | Admitting: Family Medicine

## 2013-08-12 ENCOUNTER — Other Ambulatory Visit: Payer: Self-pay | Admitting: Adult Health

## 2013-08-12 DIAGNOSIS — E291 Testicular hypofunction: Secondary | ICD-10-CM | POA: Diagnosis not present

## 2013-08-13 DIAGNOSIS — IMO0001 Reserved for inherently not codable concepts without codable children: Secondary | ICD-10-CM | POA: Diagnosis not present

## 2013-08-13 DIAGNOSIS — I10 Essential (primary) hypertension: Secondary | ICD-10-CM | POA: Diagnosis not present

## 2013-08-13 DIAGNOSIS — E785 Hyperlipidemia, unspecified: Secondary | ICD-10-CM | POA: Diagnosis not present

## 2013-08-14 ENCOUNTER — Ambulatory Visit (INDEPENDENT_AMBULATORY_CARE_PROVIDER_SITE_OTHER): Payer: Medicare Other | Admitting: Cardiology

## 2013-08-14 ENCOUNTER — Encounter: Payer: Self-pay | Admitting: Cardiology

## 2013-08-14 VITALS — BP 138/78 | HR 86 | Ht 65.0 in | Wt 219.0 lb

## 2013-08-14 DIAGNOSIS — E785 Hyperlipidemia, unspecified: Secondary | ICD-10-CM | POA: Diagnosis not present

## 2013-08-14 DIAGNOSIS — I25119 Atherosclerotic heart disease of native coronary artery with unspecified angina pectoris: Secondary | ICD-10-CM

## 2013-08-14 DIAGNOSIS — I10 Essential (primary) hypertension: Secondary | ICD-10-CM | POA: Diagnosis not present

## 2013-08-14 DIAGNOSIS — Z01810 Encounter for preprocedural cardiovascular examination: Secondary | ICD-10-CM

## 2013-08-14 DIAGNOSIS — I2510 Atherosclerotic heart disease of native coronary artery without angina pectoris: Secondary | ICD-10-CM

## 2013-08-14 DIAGNOSIS — I209 Angina pectoris, unspecified: Secondary | ICD-10-CM | POA: Diagnosis not present

## 2013-08-14 MED ORDER — METOPROLOL SUCCINATE ER 50 MG PO TB24: 50.0000 mg | ORAL_TABLET | Freq: Every day | ORAL | Status: DC

## 2013-08-14 NOTE — Assessment & Plan Note (Signed)
Continues on Pravachol. Keep followup with Dr. Wolfgang Phoenix.

## 2013-08-14 NOTE — Assessment & Plan Note (Signed)
Symptomatically stable on medical therapy. Encouraged continued exercise regimen and diet. He is making good progress in weight loss and feels much better. No change in current medical regimen. Followup in 6 months.

## 2013-08-14 NOTE — Assessment & Plan Note (Signed)
No change in antihypertensives. Keep working on weight loss.

## 2013-08-14 NOTE — Patient Instructions (Signed)
Your physician wants you to follow-up in: 6 months You will receive a reminder letter in the mail two months in advance. If you don't receive a letter, please call our office to schedule the follow-up appointment.     Your physician recommends that you continue on your current medications as directed. Please refer to the Current Medication list given to you today.      Thank you for choosing [facility] !        

## 2013-08-14 NOTE — Progress Notes (Signed)
Clinical Summary Paul Baker is a 63 y.o.male last seen in May. He continues to do well from a cardiac perspective, exercising regularly at the gym, and continues to lose weight. He reports better energy, much less angina. As it turns out, he will not be pursuing knee surgery, which he had discussed at the last visit.  Cardiac catheterization back in December 2011 demonstrated a 90% proximal RCA stenosis in a small nondominant vessel, otherwise no significant flow limiting left-sided disease - only 20% proximal LAD noted. He has been managed medically.  He continues to follow with Dr Wolfgang Phoenix for primary medical care. Last LDL was 73.   Allergies  Allergen Reactions  . Cardizem [Diltiazem Hcl]     Edema   . Cardura [Doxazosin Mesylate]     Side effects  . Lipitor [Atorvastatin] Other (See Comments)    Leg cramps  . Lotensin [Benazepril Hcl]     Side effects   . Paxil [Paroxetine Hcl]     Side effects   . Adhesive [Tape] Rash  . Codeine Rash and Other (See Comments)    Headache     Current Outpatient Prescriptions  Medication Sig Dispense Refill  . amLODipine (NORVASC) 2.5 MG tablet Take 2.5 mg by mouth daily.      [name] aspirin 325 MG tablet Take 325 mg by mouth every evening.       . Canagliflozin (INVOKANA) 100 MG TABS Take 100 mg by mouth every morning.       . clobetasol (TEMOVATE) 0.05 % external solution Apply 1 application topically daily as needed. For irritation      . clonazePAM (KLONOPIN) 1 MG tablet Take 1 mg by mouth daily.      . fenofibrate 160 MG tablet TAKE (1) TABLET BY MOUTH AT BEDTIME.  30 tablet  5  . HUMALOG 100 UNIT/ML injection 20 Units 3 (three) times daily with meals. Patient is on a sliding scale      . HYDROcodone-acetaminophen (NORCO) 10-325 MG per tablet Take 1 tablet by mouth every 4 (four) hours as needed for pain.  40 tablet  0  . ibuprofen (ADVIL,MOTRIN) 800 MG tablet Take 1 tablet (800 mg total) by mouth 3 (three) times daily.  21 tablet  0    . isosorbide mononitrate (IMDUR) 60 MG 24 hr tablet TAKE (1/2) TABLET BY MOUTH TWICE DAILY.  60 tablet  3  . LEVEMIR 100 UNIT/ML injection Inject 60 Units into the skin 2 (two) times daily.       [name] levothyroxine (SYNTHROID, LEVOTHROID) 75 MCG tablet daily.      [name] loratadine (CLARITIN) 10 MG tablet Take 10 mg by mouth at bedtime.      [name] losartan (COZAAR) 100 MG tablet TAKE ONE TABLET DAILY.  90 tablet  1  . metFORMIN (GLUCOPHAGE) 500 MG tablet TAKE 2 TABLETS EACH MORNING, 1 TABLET AT NOON, AND 2 TABLETS EVERY EVENING.  150 tablet  6  . metoprolol succinate (TOPROL-XL) 50 MG 24 hr tablet Take 1 tablet (50 mg total) by mouth daily. Take with or immediately following a meal.  60 tablet  6  . Multiple Vitamins-Minerals (MULTIVITAMIN WITH MINERALS) tablet Take 1 tablet by mouth daily.        [name] NITROQUICK 0.4 MG SL tablet DISSOLVE 1 TABLET UNDER TONGUE EVERY 5 MINUTES UP TO 15 MIN FOR CHESTPAIN. IF NO RELIEF CALL 911.  25 each  3  . omeprazole (PRILOSEC) 20 MG capsule Take 20 mg by mouth  every morning.      . pravastatin (PRAVACHOL) 10 MG tablet TAKE ONE TABLET BY MOUTH ONCE DAILY.  90 tablet  3  . rOPINIRole (REQUIP) 4 MG tablet Take 1-1.5 tablets (4-6 mg total) by mouth at bedtime.  45 tablet  5  . triamterene-hydrochlorothiazide (MAXZIDE-25) 37.5-25 MG per tablet TAKE ONE TABLET DAILY.  90 tablet  1  . VENTOLIN HFA 108 (90 BASE) MCG/ACT inhaler Inhale 1 puff into the lungs as needed for wheezing or shortness of breath.       . Armodafinil (NUVIGIL) 50 MG tablet Take 2 tablets (100 mg total) by mouth See admin instructions. Take 2 tablets in the am and 1 additional tablet in the mid-afternoon.  90 tablet  5   No current facility-administered medications for this visit.    Past Medical History  Diagnosis Date  . Arthritis   . Type 2 diabetes mellitus   . Mixed hyperlipidemia   . Essential hypertension, benign   . Septic arthritis of knee, left   . OSA (obstructive sleep apnea)   . Coronary  atherosclerosis of native coronary artery     Diseased nondominant RCA  . Pulmonary embolism 2004  . DVT (deep venous thrombosis)   . Asthma   . Rotator cuff disorder   . Colon polyp   . Morbid obesity   . RLS (restless legs syndrome)   . Hypersomnia     CPAP of 16 cm, diagnosed with AHI of 60 in 2012,epworth 21- narcolepsy?  . Cataplexy   . MRSA (methicillin resistant staph aureus) culture positive     08/2012  . Narcolepsy     Social History Paul Baker reports that he quit smoking about 18 years ago. His smoking use included Cigarettes. He smoked 0.00 packs per day. He has never used smokeless tobacco. Paul Baker reports that he does not drink alcohol.  Review of Systems Mild arthritic knee pains. Good appetite. No episodes. No falls. Other systems reviewed are negative.  Physical Examination Filed Vitals:   08/14/13 0815  BP: 138/78  Pulse: 86   Filed Weights   08/14/13 0815  Weight: 219 lb (99.338 kg)    Overweight male in no acute distress.  HEENT: Conjunctiva and lids normal, oropharynx with moist mucosa.  Neck: Supple, no elevated JVP or carotid bruits, no thyromegaly.  Lungs: Clear to auscultation, nonlabored, no wheezing.  Cardiac: Regular rate and rhythm, no S3 gallop or pericardial rub, no significant systolic murmur.  Abdomen: Soft, nontender, bowel sounds present. No bruits.  Extremities: No edema, distal pulses one plus.  Musculoskeletal: No kyphosis.   Problem List and Plan   CORONARY ATHEROSCLEROSIS NATIVE CORONARY ARTERY Symptomatically stable on medical therapy. Encouraged continued exercise regimen and diet. He is making good progress in weight loss and feels much better. No change in current medical regimen. Followup in 6 months.  HYPERLIPIDEMIA Continues on Pravachol. Keep followup with Dr. Wolfgang Phoenix.  Essential hypertension, benign No change in antihypertensives. Keep working on weight loss.    Satira Sark, M.D., F.A.C.C.

## 2013-08-19 DIAGNOSIS — R972 Elevated prostate specific antigen [PSA]: Secondary | ICD-10-CM | POA: Diagnosis not present

## 2013-08-19 DIAGNOSIS — E291 Testicular hypofunction: Secondary | ICD-10-CM | POA: Diagnosis not present

## 2013-08-19 DIAGNOSIS — N529 Male erectile dysfunction, unspecified: Secondary | ICD-10-CM | POA: Diagnosis not present

## 2013-08-20 DIAGNOSIS — IMO0001 Reserved for inherently not codable concepts without codable children: Secondary | ICD-10-CM | POA: Diagnosis not present

## 2013-08-20 DIAGNOSIS — E785 Hyperlipidemia, unspecified: Secondary | ICD-10-CM | POA: Diagnosis not present

## 2013-08-20 DIAGNOSIS — I10 Essential (primary) hypertension: Secondary | ICD-10-CM | POA: Diagnosis not present

## 2013-08-20 DIAGNOSIS — E039 Hypothyroidism, unspecified: Secondary | ICD-10-CM | POA: Diagnosis not present

## 2013-08-29 ENCOUNTER — Other Ambulatory Visit: Payer: Self-pay | Admitting: Nurse Practitioner

## 2013-08-29 NOTE — Telephone Encounter (Signed)
Rx signed and faxed.

## 2013-09-18 ENCOUNTER — Other Ambulatory Visit: Payer: Self-pay | Admitting: Family Medicine

## 2013-09-18 ENCOUNTER — Other Ambulatory Visit: Payer: Self-pay | Admitting: Cardiology

## 2013-09-23 ENCOUNTER — Encounter: Payer: Self-pay | Admitting: *Deleted

## 2013-09-23 LAB — HEMOGLOBIN A1C: A1c: 8.2

## 2013-10-08 ENCOUNTER — Encounter: Payer: Self-pay | Admitting: Neurology

## 2013-10-08 ENCOUNTER — Ambulatory Visit (INDEPENDENT_AMBULATORY_CARE_PROVIDER_SITE_OTHER): Payer: Medicare Other | Admitting: Neurology

## 2013-10-08 VITALS — BP 136/84 | HR 81 | Temp 97.8°F | Resp 14 | Ht 64.5 in | Wt 221.2 lb

## 2013-10-08 DIAGNOSIS — M4802 Spinal stenosis, cervical region: Secondary | ICD-10-CM | POA: Diagnosis not present

## 2013-10-08 DIAGNOSIS — E1310 Other specified diabetes mellitus with ketoacidosis without coma: Secondary | ICD-10-CM

## 2013-10-08 DIAGNOSIS — M4803 Spinal stenosis, cervicothoracic region: Secondary | ICD-10-CM | POA: Diagnosis not present

## 2013-10-08 DIAGNOSIS — G2581 Restless legs syndrome: Secondary | ICD-10-CM | POA: Diagnosis not present

## 2013-10-08 DIAGNOSIS — G4733 Obstructive sleep apnea (adult) (pediatric): Secondary | ICD-10-CM | POA: Diagnosis not present

## 2013-10-08 DIAGNOSIS — E1159 Type 2 diabetes mellitus with other circulatory complications: Secondary | ICD-10-CM | POA: Insufficient documentation

## 2013-10-08 DIAGNOSIS — I2510 Atherosclerotic heart disease of native coronary artery without angina pectoris: Secondary | ICD-10-CM

## 2013-10-08 DIAGNOSIS — Z9989 Dependence on other enabling machines and devices: Principal | ICD-10-CM

## 2013-10-08 DIAGNOSIS — E1110 Type 2 diabetes mellitus with ketoacidosis without coma: Secondary | ICD-10-CM

## 2013-10-08 MED ORDER — HYDROCODONE-ACETAMINOPHEN 10-325 MG PO TABS
1.0000 | ORAL_TABLET | ORAL | Status: DC | PRN
Start: 2013-10-08 — End: 2013-12-18

## 2013-10-08 MED ORDER — CLONAZEPAM 1 MG PO TABS: ORAL_TABLET | ORAL | Status: DC

## 2013-10-08 NOTE — Patient Instructions (Addendum)
Myoclonus Myoclonus is a term that refers to brief, involuntary twitching or jerking of a muscle or a group of muscles. It describes a symptom, and generally, is not a diagnosis of a disease. The myoclonic twitches or jerks are usually caused by sudden muscle contractions. They also can result from brief lapses of contraction. Myoclonic twitches or jerks may occur:  Alone or in sequence.  In a pattern or without pattern.  Infrequently or many times each minute. Familiar examples of normal myoclonus include:  Hiccups and jerks.  "Sleep starts" that some people have while drifting off to sleep. Severe cases can severely limit a person's ability to:  Eat.  Talk.  Walk. TREATMENT  Treatment for myoclonus consists of medicines that may help reduce symptoms. These drugs (many of which are also used to treat epilepsy) include:   Barbiturates.  Clonazepam.  Phenytoin.  Primidone.  Sodium valproate. The complex origins of myoclonus may require the use of multiple drugs. Document Released: 12/09/2001 Document Revised: 03/13/2011 Document Reviewed: 11/21/2012 [facility] Patient Information 2015 [place], [place]. This information is not intended to replace advice given to you by your health care provider. Make sure you discuss any questions you have with your health care provider. Restless Legs Syndrome Restless legs syndrome is a movement disorder. It may also be called a sensorimotor disorder.  CAUSES  No one knows what specifically causes restless legs syndrome, but it tends to run in families. It is also more common in people with low iron, in pregnancy, in people who need dialysis, and those with nerve damage (neuropathy).Some medications may make restless legs syndrome worse.Those medications include drugs to treat high blood pressure, some heart conditions, nausea, colds, allergies, and depression. SYMPTOMS Symptoms include uncomfortable sensations in the legs. These leg sensations  are worse during periods of inactivity or rest. They are also worse while sitting or lying down. Individuals that have the disorder describe sensations in the legs that feel like:  Pulling.  Drawing.  Crawling.  Worming.  Boring.  Tingling.  Pins and needles.  Prickling.  Pain. The sensations are usually accompanied by an overwhelming urge to move the legs. Sudden muscle jerks may also occur. Movement provides temporary relief from the discomfort. In rare cases, the arms may also be affected. Symptoms may interfere with going to sleep (sleep onset insomnia). Restless legs syndrome may also be related to periodic limb movement disorder (PLMD). PLMD is another more common motor disorder. It also causes interrupted sleep. The symptoms from PLMD usually occur most often when you are awake. TREATMENT  Treatment for restless legs syndrome is symptomatic. This means that the symptoms are treated.   Massage and cold compresses may provide temporary relief.  Walk, stretch, or take a cold or hot bath.  Get regular exercise and a good night's sleep.  Avoid caffeine, alcohol, nicotine, and medications that can make it worse.  Do activities that provide mental stimulation like discussions, needlework, and video games. These may be helpful if you are not able to walk or stretch. Some medications are effective in relieving the symptoms. However, many of these medications have side effects. Ask your caregiver about medications that may help your symptoms. Correcting iron deficiency may improve symptoms for some patients. Document Released: 12/09/2001 Document Revised: 05/05/2013 Document Reviewed: 03/17/2010 [facility] Patient Information 2015 [place], [place]. This information is not intended to replace advice given to you by your health care provider. Make sure you discuss any questions you have with your health care provider.

## 2013-10-08 NOTE — Addendum Note (Signed)
Addended by: Larey Seat on: 10/08/2013 10:23 AM   Modules accepted: Orders

## 2013-10-08 NOTE — Progress Notes (Signed)
PATIENT: Paul Baker DOB: September 03, 1950  REASON FOR VISIT: follow up for OSA on CPAP, Hypersomnia, persistent HISTORY FROM: patient  HISTORY OF PRESENT ILLNESS:   Compliance visit:  The download from 09-08-1508-6-15 revealed an 11.5 cm water pressure need, average daily used to time 4 hours 41 minutes, the patient used the machine on 27 of 30 days. The patient used the machine 4 hours on 20 of 30 days. Compliance over 4 hours or 66%. Residual AHI of 0.5. UPDATE 04/02/13 (LL): Paul Baker returns for follow up visit.  He brings his Cpap machine today and the download provides a reprot for 25 days.  Compliance is around 50% He has reduced compliance this time because he was in the hospitital twice for hyperglycemia and also he states his mask is not fitting well, but he does use it religiously.  He plans on getting a new one soon. He states that [name] is wearing off in the mid afternoons, around 2-3 pm.   He is so sleepy that he has fallen asleep during conversation. His Epworth Score today is 22. FSS is 43 points. He is exhausted, he is desperate. Rises at 4.30 AM and goes to bed at 10 PM. By midnight he wakes and often cannot resume sleep for several hours.    This patient with severe obstructive sleep apnea followed by Dr. Krista Blue. AHI 62 titrated to 16 cm water. DME was [employer] , PCP Dr.-Luking.  Apnea cannot longer be the cause for his excessive daytime sleepiness, I will try to adjust his medication treatment for restless leg syndrome.    05/31/12 (CD):The patient returns today for a revisit. He has been using a CPAP With variable pressure settings. I reviewed his CPAP a little titration from the date 04/05/2012 through 05/04/2012. His 95th percentile pressure is 14 cm, his AHI is 0.6. I consider his apnea optimally treated. Average daily usages 5 hours and 20 minutes for the last 30 days he would be 100% compliant by Medicare criteria.  Annual download dated 7-17 2014  for a 35 day. Shows that the patient used 100% of all recorded base CPAP with a minimum pressure of 6 cm maximum pressure of 16 cm in his 95th percentile pressure was 14.6 cm water his residual AHI is 1.2. Average usage time is 5 hours 6 minutes. It is my impression that the patient apnea cannot be responsible for his daytime sleepiness anymore.  We are now discussing possible narcolepsy versus severe impairment from the restless legs causing this excessive daytime sleepiness. I have tried the patient on NUPRO Patch at 2 strength , each did not help him to fall asleep easier or maintain sleep.  Epworth sleepiness score of was endorsed still at 20 points ( while using CPAP). Today it is 18 points, still highly abnormal on high dose dopaminergic medication. He endorses RLS with myoclonus , and may respond to Klonipin. .  The patient had a severe nasal sinus infection on 08-15-12 MRSA . Renaxa can't be used with NUVIGIL and he was weaned off Renaxa. He should be able to use it now.  Obtained an anemia panel to complete has review of metabolic factors contributing to restless leg syndrome. The patient is aware that his risk factors include thyroid disease, diabetic neuropathy, but also had worsening of restless legs after a knee surgery possibly due to blood loss.  100% compliance with CPAP. As discussed during visit. Normal FE , ferritin levels.   REVIEW OF SYSTEMS:  Full 14 system review of systems performed and notable only for:  Sleepiness scale endorsed  at 22 points and fatigue severity at 43 points.   Facial swelling, hearing loss, ringing in the ears, eye pain, cough, chest pain, leg swelling, restless legs, apnea, frequent waking, daytime sleepiness, snoring, sleep talking, sleep walking, acting out dreams, joint pain, joint swelling, back pain, aching muscles, muscle cramps, walking difficulty, coordination problems, dizziness, headache  ALLERGIES: Allergies  Allergen Reactions  . Cardizem  [Diltiazem Hcl]     Edema   . Cardura [Doxazosin Mesylate]     Side effects  . Lipitor [Atorvastatin] Other (See Comments)    Leg cramps  . Paxil [Paroxetine Hcl]     Side effects   . Adhesive [Tape] Rash  . Codeine Rash and Other (See Comments)    Headache     HOME MEDICATIONS: Outpatient Prescriptions Prior to Visit  Medication Sig Dispense Refill  . amLODipine (NORVASC) 2.5 MG tablet Take 2.5 mg by mouth daily.      [name] amLODipine (NORVASC) 5 MG tablet TAKE ONE TABLET BY MOUTH ONCE DAILY.  30 tablet  6  . aspirin 325 MG tablet Take 325 mg by mouth every evening.       . Canagliflozin (INVOKANA) 100 MG TABS Take 100 mg by mouth every morning.       . clobetasol (TEMOVATE) 0.05 % external solution Apply 1 application topically daily as needed. For irritation      . clonazePAM (KLONOPIN) 1 MG tablet Take 1 mg by mouth daily.      . fenofibrate 160 MG tablet TAKE (1) TABLET BY MOUTH AT BEDTIME.  30 tablet  0  . HUMALOG 100 UNIT/ML injection 20 Units 3 (three) times daily with meals. Patient is on a sliding scale      . HYDROcodone-acetaminophen (NORCO) 10-325 MG per tablet Take 1 tablet by mouth every 4 (four) hours as needed for pain.  40 tablet  0  . ibuprofen (ADVIL,MOTRIN) 800 MG tablet Take 1 tablet (800 mg total) by mouth 3 (three) times daily.  21 tablet  0  . isosorbide mononitrate (IMDUR) 60 MG 24 hr tablet TAKE (1/2) TABLET BY MOUTH TWICE DAILY.  60 tablet  3  . LEVEMIR 100 UNIT/ML injection Inject 60 Units into the skin 2 (two) times daily.       [name] levothyroxine (SYNTHROID, LEVOTHROID) 75 MCG tablet daily.      [name] loratadine (CLARITIN) 10 MG tablet Take 10 mg by mouth at bedtime.      [name] losartan (COZAAR) 100 MG tablet TAKE ONE TABLET DAILY.  90 tablet  1  . metFORMIN (GLUCOPHAGE) 500 MG tablet TAKE 2 TABLETS EACH MORNING, 1 TABLET AT NOON, AND 2 TABLETS EVERY EVENING.  150 tablet  6  . metoprolol succinate (TOPROL-XL) 50 MG 24 hr tablet Take 1 tablet (50 mg total) by mouth  daily. Take with or immediately following a meal.  60 tablet  6  . Multiple Vitamins-Minerals (MULTIVITAMIN WITH MINERALS) tablet Take 1 tablet by mouth daily.        [name] NITROQUICK 0.4 MG SL tablet DISSOLVE 1 TABLET UNDER TONGUE EVERY 5 MINUTES UP TO 15 MIN FOR CHESTPAIN. IF NO RELIEF CALL 911.  25 each  3  . NUVIGIL 50 MG tablet TAKE 2 TABLETS BY MOUTH IN THE MORNING AND 1 TABLET IN THE MID-AFTERNOON.  90 tablet  5  . omeprazole (PRILOSEC) 20 MG capsule TAKE (1) CAPSULE BY MOUTH TWICE DAILY.  60 capsule  5  . pravastatin (PRAVACHOL) 10 MG tablet TAKE ONE TABLET BY MOUTH ONCE DAILY.  90 tablet  3  . rOPINIRole (REQUIP) 4 MG tablet Take 1-1.5 tablets (4-6 mg total) by mouth at bedtime.  45 tablet  5  . triamterene-hydrochlorothiazide (MAXZIDE-25) 37.5-25 MG per tablet TAKE ONE TABLET DAILY.  90 tablet  1  . VENTOLIN HFA 108 (90 BASE) MCG/ACT inhaler Inhale 1 puff into the lungs as needed for wheezing or shortness of breath.        No facility-administered medications prior to visit.     PHYSICAL EXAM  Filed Vitals:   10/08/13 0932  BP: 136/84  Pulse: 81  Temp: 97.8 F (36.6 C)  TempSrc: Oral  Resp: 14  Height: 5' 4.5" (1.638 m)  Weight: 221 lb 4 oz (100.358 kg)   Body mass index is 37.4 kg/(m^2).  General: The patient is awake, alert and appears not in acute distress. The patient is well groomed.  Head: Normocephalic, atraumatic. Neck is supple. Cardiovascular: Regular rate and rhythm, without murmurs or carotid bruit, and without distended neck veins.  Respiratory: Lungs are clear to auscultation.  Skin: Without evidence of edema, or rash  Trunk: BMI is elevated , patient has normal posture.   Neurologic exam :  The patient is oriented to place and time.  Memory subjective described as intact. There is a normal attention span & concentration ability. Mood and affect are appropriate.  Cranial nerves:  Pupils are equal and briskly reactive to light. . Extraocular movements in  vertical and horizontal planes intact and without nystagmus. Visual fields by finger perimetry are intact.  Hearing to finger rub intact. Facial sensation intact to fine touch. Facial motor strength is symmetric and tongue and uvula moves midline.  Motor exam: Normal tone and normal muscle bulk and symmetric normal strength in all extremities.the patient reports dropping objects, tingling in both hands.  Sensory: Fine touch, pinprick and vibration were tested in all extremities.  Coordination: Rapid alternating movements in the fingers/hands is tested and normal. Finger-to-nose maneuver tested and normal without evidence of ataxia, dysmetria or tremor.  Gait and station: Patient walks without assistive device , climb up to the exam table. Strength within normal limits. Stance is stable and normal.  Reflexes: Attenuated Reflexes, unusual for a patient with documented spinal stenosis.    ASSESSMENT AND PLAN OSA - treated on CPAP with very good result for sleep apnea, documented in general CPAP downloads the last one from 7-17 2014. Residual AHI was 1.2. He needs a new mask and he uses a new DME,  [facility]- [employer]    Restless leg syndrome with myoclonus, has not been responding well to increased doses of dopaminergic medication.  He is now taking 6 mg of Requip and 2 mg of Klonopin. Failed baclofen, Neurontin, Mirapex,   Spinal stenosis contributes to RLS. He had surgery lumbar stenosis. Dr Joya Salm.  I will order C spine and Th spine MRI Father and daughter have RLS with Myoclonus, familiar form.   Nuvigil only a "band aid " lasting 4-5 hours only. Refilled.    Meds ordered this encounter  Medications  . Armodafinil (NUVIGIL) 50 MG tablet    Sig: Take 2 tablets (100 mg total) by mouth See admin instructions. Take 2 tablets in the am and 1 additional tablet in the mid-afternoon.    Dispense:  90 tablet    Refill:  5    Pharmacy Fax [phone]    Order Specific  Question:  Supervising Provider     Answer:  Brett Fairy, Kealii Thueson [[id number]]  . clonazePAM (KLONOPIN) 1 MG tablet    Sig: Take 0.5 tablets (0.5 mg total) by mouth at bedtime as needed and may repeat dose one time if needed (for RLS).    Dispense:  60 tablet    Refill:  5    Order Specific Question:  Supervising Provider    Answer:  Manali Mcelmurry [[id number]]  . rOPINIRole (REQUIP) 4 MG tablet    Sig: Take 1-1.5 tablets (4-6 mg total) by mouth at bedtime.    Dispense:  45 tablet    Refill:  5    Order Specific Question:  Supervising Provider    Answer:  Andrey Spearman R [[id number]]     Larey Seat , MD  10:11 AM [facility] [address], [place], [place] [address] [phone]

## 2013-10-08 NOTE — Addendum Note (Signed)
Addended by: Larey Seat on: 10/08/2013 10:20 AM   Modules accepted: Orders

## 2013-10-09 LAB — COMPREHENSIVE METABOLIC PANEL
ALK PHOS: 49 IU/L (ref 39–117)
ALT: 22 IU/L (ref 0–44)
AST: 17 IU/L (ref 0–40)
Albumin/Globulin Ratio: 1.8 (ref 1.1–2.5)
Albumin: 4.3 g/dL (ref 3.6–4.8)
BUN / CREAT RATIO: 20 (ref 10–22)
BUN: 28 mg/dL — ABNORMAL HIGH (ref 8–27)
CALCIUM: 9.7 mg/dL (ref 8.6–10.2)
CHLORIDE: 99 mmol/L (ref 97–108)
CO2: 21 mmol/L (ref 18–29)
CREATININE: 1.38 mg/dL — AB (ref 0.76–1.27)
GFR calc Af Amer: 63 mL/min/{1.73_m2} (ref >59)
GFR calc non Af Amer: 54 mL/min/{1.73_m2} — ABNORMAL LOW (ref >59)
Globulin, Total: 2.4 g/dL (ref 1.5–4.5)
Glucose: 204 mg/dL — ABNORMAL HIGH (ref 65–99)
Potassium: 4.3 mmol/L (ref 3.5–5.2)
SODIUM: 141 mmol/L (ref 134–144)
Total Bilirubin: <0.2 mg/dL (ref 0.0–1.2)
Total Protein: 6.7 g/dL (ref 6.0–8.5)

## 2013-10-13 ENCOUNTER — Ambulatory Visit (INDEPENDENT_AMBULATORY_CARE_PROVIDER_SITE_OTHER): Payer: Medicare Other | Admitting: Family Medicine

## 2013-10-13 ENCOUNTER — Encounter: Payer: Self-pay | Admitting: Family Medicine

## 2013-10-13 VITALS — BP 130/86 | Ht 65.0 in | Wt 220.8 lb

## 2013-10-13 DIAGNOSIS — Z23 Encounter for immunization: Secondary | ICD-10-CM

## 2013-10-13 DIAGNOSIS — I2510 Atherosclerotic heart disease of native coronary artery without angina pectoris: Secondary | ICD-10-CM

## 2013-10-13 DIAGNOSIS — E785 Hyperlipidemia, unspecified: Secondary | ICD-10-CM

## 2013-10-13 DIAGNOSIS — Z125 Encounter for screening for malignant neoplasm of prostate: Secondary | ICD-10-CM | POA: Diagnosis not present

## 2013-10-13 NOTE — Progress Notes (Signed)
   Subjective:    Patient ID: Paul Baker, male    DOB: 22-Dec-1950, 63 y.o.   MRN: [id number]  Hypertension This is a chronic problem. The current episode started more than 1 year ago. Risk factors for coronary artery disease include diabetes mellitus, dyslipidemia, male gender and obesity. Treatments tried: norvasc, metoprolol, imdur. There are no compliance problems.    Flu shot not yet  Dr nida q three mo for diabetes. Next  Compliant with lipid medication. No obvious side effects. Watching his diet.  Asthma is clinically stable no recent flares.  Reflux is clinically stable also.   Review of Systems No headache no chest pain chronic arthritis pain no change in bowel habits recent diagnosis of restless leg syndrome    Objective:   Physical Exam  alert obesity present HEENT normal. Blood pressure 132/80 on repeat lungs clear heart regular in rhythm ankles without edema       Assessment & Plan:  Impression 1 hypertension good control #2 hyperlipidemia status uncertain #3 restless leg syndrome discussed plan flu vaccine. Appropriate blood work. Diet exercise discussed. Wellness exam at followup. WSL

## 2013-10-16 ENCOUNTER — Ambulatory Visit
Admission: RE | Admit: 2013-10-16 | Discharge: 2013-10-16 | Disposition: A | Discharge status: Home / Self Care | Payer: Medicare Other | Source: Ambulatory Visit | Attending: Neurology | Admitting: Neurology

## 2013-10-16 DIAGNOSIS — G2581 Restless legs syndrome: Secondary | ICD-10-CM

## 2013-10-16 DIAGNOSIS — Z9989 Dependence on other enabling machines and devices: Principal | ICD-10-CM

## 2013-10-16 DIAGNOSIS — E1110 Type 2 diabetes mellitus with ketoacidosis without coma: Secondary | ICD-10-CM

## 2013-10-16 DIAGNOSIS — M4803 Spinal stenosis, cervicothoracic region: Secondary | ICD-10-CM

## 2013-10-16 DIAGNOSIS — G4733 Obstructive sleep apnea (adult) (pediatric): Secondary | ICD-10-CM

## 2013-10-16 MED ORDER — GADOBENATE DIMEGLUMINE 529 MG/ML IV SOLN
20.0000 mL | Freq: Once | INTRAVENOUS | Status: AC | PRN
  Administered 2013-10-16: 20 mL via INTRAVENOUS

## 2013-10-22 ENCOUNTER — Other Ambulatory Visit: Payer: Self-pay | Admitting: Nurse Practitioner

## 2013-10-28 NOTE — Progress Notes (Signed)
Quick Note:  Result given to pt. As per Dr. Edwena Felty note. ______

## 2013-10-28 NOTE — Progress Notes (Signed)
Quick Note:  Results of MRI Thoracic spine given to pt (per Dr. Edwena Felty note). ______

## 2013-11-10 ENCOUNTER — Encounter: Payer: Self-pay | Admitting: Family Medicine

## 2013-11-10 ENCOUNTER — Ambulatory Visit (INDEPENDENT_AMBULATORY_CARE_PROVIDER_SITE_OTHER): Payer: Medicare Other | Admitting: Family Medicine

## 2013-11-10 VITALS — BP 138/88 | Ht 65.0 in | Wt 223.3 lb

## 2013-11-10 DIAGNOSIS — M25561 Pain in right knee: Secondary | ICD-10-CM

## 2013-11-10 DIAGNOSIS — M25551 Pain in right hip: Secondary | ICD-10-CM | POA: Diagnosis not present

## 2013-11-10 DIAGNOSIS — I2510 Atherosclerotic heart disease of native coronary artery without angina pectoris: Secondary | ICD-10-CM | POA: Diagnosis not present

## 2013-11-10 MED ORDER — PREDNISONE 10 MG PO TABS: ORAL_TABLET | ORAL | Status: DC

## 2013-11-10 NOTE — Progress Notes (Signed)
   Subjective:    Patient ID: Paul Baker, male    DOB: [birth year], 63 y.o.   MRN: [id number]  HPI Patient arrives with right hip and leg pain s/p fall out of bed last week. Patient states the pain is getting worse instead of better.  Sig pain in hip  Bruise per lat thight  Also sig and  Hip and lat hip pain  Patient actually took a fall out of the bed. Struck tonight stand on the way down. He hit a hard wooden surface.  Has been limping since. Review of Systems No abdominal pain no back pain no chest pain ROS otherwise negative    Objective:   Physical Exam  Alert no apparent distress. Lungs clear. Heart regular in rhythm. Right lateral hip tenderness deep palpation positive pain with internal rotation right anterior shin tender to palpation      Assessment & Plan:  Impression multiple contusions with element of trochanteric bursitis discussed plan prednisone taper. Local measures. Pain medicine when necessary. WSL

## 2013-11-11 ENCOUNTER — Ambulatory Visit (HOSPITAL_COMMUNITY)
Admission: RE | Admit: 2013-11-11 | Discharge: 2013-11-11 | Disposition: A | Discharge status: Home / Self Care | Payer: Medicare Other | Source: Ambulatory Visit | Attending: Family Medicine | Admitting: Family Medicine

## 2013-11-11 DIAGNOSIS — H02831 Dermatochalasis of right upper eyelid: Secondary | ICD-10-CM | POA: Diagnosis not present

## 2013-11-11 DIAGNOSIS — H02834 Dermatochalasis of left upper eyelid: Secondary | ICD-10-CM | POA: Diagnosis not present

## 2013-11-11 DIAGNOSIS — H4322 Crystalline deposits in vitreous body, left eye: Secondary | ICD-10-CM | POA: Diagnosis not present

## 2013-11-11 DIAGNOSIS — Z961 Presence of intraocular lens: Secondary | ICD-10-CM | POA: Diagnosis not present

## 2013-11-11 DIAGNOSIS — W19XXXA Unspecified fall, initial encounter: Secondary | ICD-10-CM | POA: Diagnosis not present

## 2013-11-11 DIAGNOSIS — S79911A Unspecified injury of right hip, initial encounter: Secondary | ICD-10-CM | POA: Diagnosis not present

## 2013-11-11 DIAGNOSIS — S8991XA Unspecified injury of right lower leg, initial encounter: Secondary | ICD-10-CM | POA: Diagnosis not present

## 2013-11-11 DIAGNOSIS — M79661 Pain in right lower leg: Secondary | ICD-10-CM | POA: Insufficient documentation

## 2013-11-11 DIAGNOSIS — E119 Type 2 diabetes mellitus without complications: Secondary | ICD-10-CM | POA: Diagnosis not present

## 2013-11-11 DIAGNOSIS — M25551 Pain in right hip: Secondary | ICD-10-CM | POA: Insufficient documentation

## 2013-11-11 LAB — HM DIABETES EYE EXAM

## 2013-11-11 NOTE — Progress Notes (Signed)
Patient notified and verbalized understanding of the test results. No further questions. 

## 2013-11-13 DIAGNOSIS — E119 Type 2 diabetes mellitus without complications: Secondary | ICD-10-CM | POA: Diagnosis not present

## 2013-11-13 DIAGNOSIS — Z125 Encounter for screening for malignant neoplasm of prostate: Secondary | ICD-10-CM | POA: Diagnosis not present

## 2013-11-13 DIAGNOSIS — E785 Hyperlipidemia, unspecified: Secondary | ICD-10-CM | POA: Diagnosis not present

## 2013-11-13 DIAGNOSIS — E039 Hypothyroidism, unspecified: Secondary | ICD-10-CM | POA: Diagnosis not present

## 2013-11-13 DIAGNOSIS — I10 Essential (primary) hypertension: Secondary | ICD-10-CM | POA: Diagnosis not present

## 2013-11-13 LAB — LIPID PANEL
CHOLESTEROL: 211 mg/dL — AB (ref 0–200)
HDL: 49 mg/dL (ref >39)
LDL Cholesterol: 131 mg/dL — ABNORMAL HIGH (ref 0–99)
TRIGLYCERIDES: 155 mg/dL — AB (ref <150)
Total CHOL/HDL Ratio: 4.3 Ratio
VLDL: 31 mg/dL (ref 0–40)

## 2013-11-14 LAB — PSA, MEDICARE: PSA: 2.03 ng/mL (ref <=4.00)

## 2013-11-21 ENCOUNTER — Encounter: Payer: Medicare Other | Attending: "Endocrinology | Admitting: Nutrition

## 2013-11-21 VITALS — Ht 66.0 in | Wt 220.0 lb

## 2013-11-21 DIAGNOSIS — I10 Essential (primary) hypertension: Secondary | ICD-10-CM | POA: Diagnosis not present

## 2013-11-21 DIAGNOSIS — E1122 Type 2 diabetes mellitus with diabetic chronic kidney disease: Secondary | ICD-10-CM | POA: Diagnosis not present

## 2013-11-21 DIAGNOSIS — E118 Type 2 diabetes mellitus with unspecified complications: Secondary | ICD-10-CM | POA: Diagnosis not present

## 2013-11-21 DIAGNOSIS — E782 Mixed hyperlipidemia: Secondary | ICD-10-CM | POA: Diagnosis not present

## 2013-11-21 DIAGNOSIS — E038 Other specified hypothyroidism: Secondary | ICD-10-CM | POA: Diagnosis not present

## 2013-11-21 DIAGNOSIS — Z713 Dietary counseling and surveillance: Secondary | ICD-10-CM | POA: Diagnosis not present

## 2013-11-21 NOTE — Progress Notes (Signed)
  Medical Nutrition Therapy:  Appt start time: 1030 end time:  1100.   Assessment:  Primary concerns today: Diabetes. Pt. Here to see Dr. [name]. Lives with his wife. A1C 8.2%. States he works out a few times per week. Has lost about 30 lbs in the last few months. A1C down from 8.9%. Has a family history of CKD and sibling with kidney cancer. Has CAD. Invokana was discontinued today and Metformin reduced to 500 mg BID due to CRI.  Preferred Learning Style:   Auditory  Visual  Hands on  Learning Readiness:     Ready  Change in progress   MEDICATIONS: see list   DIETARY INTAKE:  He eats usually 2-3 meals. Tries to avoid breads, potatoes. Likes most foods. Drinks mostly water.  Usual physical activity: Works out a few times per week.  Estimated energy needs: 1800 calories 200 g carbohydrates 135 g protein 50 g fat  Progress Towards Goal(s): in progress   Nutritional Diagnosis:  NB-1.1 Food and nutrition-related knowledge deficit As related to Diabetes.  As evidenced by A1C 8.2%..    Intervention:  Nutrition counseling and diabetes education.. Plan:  Aim for 3-4 Carb Choices per meal (45-60 grams) +/- 1 either way  Avoid snacks between meals  Include protein in moderation with your meals Consider  increasing your activity level by 60 minutes of cardio (walking or riding bike)  daily as tolerated Measure foods out for portion control Increase fresh fruits and vegetables. Test blood sugars before meals and at bedtime and record on BS log. Consider taking medication  as directed by MD  Teaching Method Utilized:  Visual Auditory Hands on  Handouts given during visit include:  The Plate Method  Meal Plan Card  Barriers to learning/adherence to lifestyle change: none  Demonstrated degree of understanding via:  Teach Back   Monitoring/Evaluation:  Dietary intake, exercise, meal planning, SBG, and body weight in 1 month(s).

## 2013-11-24 ENCOUNTER — Other Ambulatory Visit: Payer: Self-pay | Admitting: *Deleted

## 2013-11-24 ENCOUNTER — Other Ambulatory Visit: Payer: Self-pay | Admitting: Family Medicine

## 2013-11-24 MED ORDER — PRAVASTATIN SODIUM 20 MG PO TABS: 20.0000 mg | ORAL_TABLET | Freq: Every day | ORAL | Status: DC

## 2013-11-26 NOTE — Patient Instructions (Signed)
Plan:  Aim for 3-4 Carb Choices per meal (45-60 grams) +/- 1 either way  Avoid snacks between meals  Include protein in moderation with your meals Consider  increasing your activity level by 60 minutes of cardio (walking or riding bike)  daily as tolerated Measure foods out for portion control Increase fresh fruits and vegetables. Test blood sugars before meals and at bedtime and record on BS log. Consider taking medication  as directed by MD

## 2013-12-02 DIAGNOSIS — M25561 Pain in right knee: Secondary | ICD-10-CM | POA: Diagnosis not present

## 2013-12-02 DIAGNOSIS — M25571 Pain in right ankle and joints of right foot: Secondary | ICD-10-CM | POA: Diagnosis not present

## 2013-12-02 DIAGNOSIS — M25551 Pain in right hip: Secondary | ICD-10-CM | POA: Diagnosis not present

## 2013-12-09 ENCOUNTER — Encounter: Payer: Self-pay | Admitting: Family Medicine

## 2013-12-10 DIAGNOSIS — M25551 Pain in right hip: Secondary | ICD-10-CM | POA: Diagnosis not present

## 2013-12-10 DIAGNOSIS — M6281 Muscle weakness (generalized): Secondary | ICD-10-CM | POA: Diagnosis not present

## 2013-12-10 DIAGNOSIS — M25671 Stiffness of right ankle, not elsewhere classified: Secondary | ICD-10-CM | POA: Diagnosis not present

## 2013-12-10 DIAGNOSIS — M25571 Pain in right ankle and joints of right foot: Secondary | ICD-10-CM | POA: Diagnosis not present

## 2013-12-11 DIAGNOSIS — M6281 Muscle weakness (generalized): Secondary | ICD-10-CM | POA: Diagnosis not present

## 2013-12-11 DIAGNOSIS — M25551 Pain in right hip: Secondary | ICD-10-CM | POA: Diagnosis not present

## 2013-12-11 DIAGNOSIS — M25571 Pain in right ankle and joints of right foot: Secondary | ICD-10-CM | POA: Diagnosis not present

## 2013-12-11 DIAGNOSIS — M25671 Stiffness of right ankle, not elsewhere classified: Secondary | ICD-10-CM | POA: Diagnosis not present

## 2013-12-12 DIAGNOSIS — M25551 Pain in right hip: Secondary | ICD-10-CM | POA: Diagnosis not present

## 2013-12-12 DIAGNOSIS — M25571 Pain in right ankle and joints of right foot: Secondary | ICD-10-CM | POA: Diagnosis not present

## 2013-12-12 DIAGNOSIS — M25671 Stiffness of right ankle, not elsewhere classified: Secondary | ICD-10-CM | POA: Diagnosis not present

## 2013-12-12 DIAGNOSIS — M6281 Muscle weakness (generalized): Secondary | ICD-10-CM | POA: Diagnosis not present

## 2013-12-15 DIAGNOSIS — M6281 Muscle weakness (generalized): Secondary | ICD-10-CM | POA: Diagnosis not present

## 2013-12-15 DIAGNOSIS — M25671 Stiffness of right ankle, not elsewhere classified: Secondary | ICD-10-CM | POA: Diagnosis not present

## 2013-12-15 DIAGNOSIS — M25551 Pain in right hip: Secondary | ICD-10-CM | POA: Diagnosis not present

## 2013-12-15 DIAGNOSIS — M25571 Pain in right ankle and joints of right foot: Secondary | ICD-10-CM | POA: Diagnosis not present

## 2013-12-18 ENCOUNTER — Other Ambulatory Visit: Payer: Self-pay | Admitting: Neurology

## 2013-12-18 MED ORDER — HYDROCODONE-ACETAMINOPHEN 10-325 MG PO TABS: 1.0000 | ORAL_TABLET | ORAL | Status: DC | PRN

## 2013-12-18 NOTE — Telephone Encounter (Signed)
Rx was last prescribed at [facility] in Oct.  Dr Dohmeier is out of the office, Request entered, forwarded to [facility] for approval.

## 2013-12-18 NOTE — Telephone Encounter (Signed)
Patient requesting Rx refill for HYDROcodone-acetaminophen (NORCO) 10-325 MG per tablet.  Please call  When ready for pick up.

## 2013-12-19 DIAGNOSIS — M25571 Pain in right ankle and joints of right foot: Secondary | ICD-10-CM | POA: Diagnosis not present

## 2013-12-19 DIAGNOSIS — M25671 Stiffness of right ankle, not elsewhere classified: Secondary | ICD-10-CM | POA: Diagnosis not present

## 2013-12-19 DIAGNOSIS — M25551 Pain in right hip: Secondary | ICD-10-CM | POA: Diagnosis not present

## 2013-12-19 DIAGNOSIS — M6281 Muscle weakness (generalized): Secondary | ICD-10-CM | POA: Diagnosis not present

## 2013-12-19 NOTE — Telephone Encounter (Signed)
I called the patient to let them know their Rx for Hydrocodone was ready for pickup. Patient was instructed to bring Photo ID.

## 2013-12-22 DIAGNOSIS — M25671 Stiffness of right ankle, not elsewhere classified: Secondary | ICD-10-CM | POA: Diagnosis not present

## 2013-12-22 DIAGNOSIS — M25551 Pain in right hip: Secondary | ICD-10-CM | POA: Diagnosis not present

## 2013-12-22 DIAGNOSIS — M6281 Muscle weakness (generalized): Secondary | ICD-10-CM | POA: Diagnosis not present

## 2013-12-22 DIAGNOSIS — M25571 Pain in right ankle and joints of right foot: Secondary | ICD-10-CM | POA: Diagnosis not present

## 2013-12-23 DIAGNOSIS — M25571 Pain in right ankle and joints of right foot: Secondary | ICD-10-CM | POA: Diagnosis not present

## 2013-12-23 DIAGNOSIS — M25551 Pain in right hip: Secondary | ICD-10-CM | POA: Diagnosis not present

## 2013-12-23 DIAGNOSIS — M25671 Stiffness of right ankle, not elsewhere classified: Secondary | ICD-10-CM | POA: Diagnosis not present

## 2013-12-23 DIAGNOSIS — M6281 Muscle weakness (generalized): Secondary | ICD-10-CM | POA: Diagnosis not present

## 2013-12-24 ENCOUNTER — Telehealth: Payer: Self-pay | Admitting: Cardiology

## 2013-12-24 ENCOUNTER — Other Ambulatory Visit: Payer: Self-pay | Admitting: Cardiology

## 2013-12-24 DIAGNOSIS — M25671 Stiffness of right ankle, not elsewhere classified: Secondary | ICD-10-CM | POA: Diagnosis not present

## 2013-12-24 DIAGNOSIS — M25551 Pain in right hip: Secondary | ICD-10-CM | POA: Diagnosis not present

## 2013-12-24 DIAGNOSIS — M6281 Muscle weakness (generalized): Secondary | ICD-10-CM | POA: Diagnosis not present

## 2013-12-24 DIAGNOSIS — M25571 Pain in right ankle and joints of right foot: Secondary | ICD-10-CM | POA: Diagnosis not present

## 2013-12-24 NOTE — Telephone Encounter (Signed)
Received fax refill request  Rx # [id number] Medication:  Nitrostat 0.4 mg 4 X 25 tab Qty 25 Sig:  Dissolve one tablet under tongue every 5 minutes up to 15 min for chest pain. IF no relief call 911. Physician:  Domenic Polite

## 2013-12-29 ENCOUNTER — Encounter: Payer: Self-pay | Admitting: Nutrition

## 2013-12-29 ENCOUNTER — Encounter: Payer: Medicare Other | Attending: "Endocrinology | Admitting: Nutrition

## 2013-12-29 VITALS — Ht 65.0 in | Wt 221.0 lb

## 2013-12-29 DIAGNOSIS — Z6836 Body mass index (BMI) 36.0-36.9, adult: Secondary | ICD-10-CM | POA: Diagnosis not present

## 2013-12-29 DIAGNOSIS — M25671 Stiffness of right ankle, not elsewhere classified: Secondary | ICD-10-CM | POA: Diagnosis not present

## 2013-12-29 DIAGNOSIS — E1165 Type 2 diabetes mellitus with hyperglycemia: Secondary | ICD-10-CM | POA: Insufficient documentation

## 2013-12-29 DIAGNOSIS — M25551 Pain in right hip: Secondary | ICD-10-CM | POA: Diagnosis not present

## 2013-12-29 DIAGNOSIS — M25571 Pain in right ankle and joints of right foot: Secondary | ICD-10-CM | POA: Diagnosis not present

## 2013-12-29 DIAGNOSIS — E1310 Other specified diabetes mellitus with ketoacidosis without coma: Secondary | ICD-10-CM | POA: Insufficient documentation

## 2013-12-29 DIAGNOSIS — N182 Chronic kidney disease, stage 2 (mild): Secondary | ICD-10-CM

## 2013-12-29 DIAGNOSIS — Z713 Dietary counseling and surveillance: Secondary | ICD-10-CM | POA: Diagnosis not present

## 2013-12-29 DIAGNOSIS — E1110 Type 2 diabetes mellitus with ketoacidosis without coma: Secondary | ICD-10-CM

## 2013-12-29 DIAGNOSIS — M6281 Muscle weakness (generalized): Secondary | ICD-10-CM | POA: Diagnosis not present

## 2013-12-29 DIAGNOSIS — IMO0002 Reserved for concepts with insufficient information to code with codable children: Secondary | ICD-10-CM

## 2013-12-29 NOTE — Progress Notes (Signed)
  Medical Nutrition Therapy:  Appt start time: 1030 end time:  1100.   Assessment:  Primary concerns today: Diabetes.  Here with his wife. Has cut back on portions sizes. Blood sugars are improving. He brought BS log and it shows some low blood sugars. He is eating more fresh fruits, vegetables and trying to avoid more processed and high fat foods. Gained 1 lb. He is having some low blood sugars. Inadequate CHO intake at times and then spiking blood sugars due to inconsistent CHO intake. Works out daily and then twice a day some days with physical therapy after he fell out the bed a few months ago. He is on 60 units of Levemir daily and 20 units of Humalog U200 TID plus sliding scale. He has muscular build.  Preferred Learning Style:   Auditory  Visual  Hands on  Learning Readiness:     Ready  Change in progress   MEDICATIONS: see list   DIETARY INTAKE:  B) 1 egg, 2 slices of white bread, 1 slice of bacon or sausage, coffee L) Chicken wing, 1 slices pizza, water OR Tuna sandwich or PB sandwich, water D)Grilled chicken, green beans, salad, sweet potato or butternut squash, water  He eats usually 2-3 meals. Tries to avoid breads, potatoes. Likes most foods. Drinks mostly water.  Usual physical activity: Works out a few times per week plus getting physical therapy 2-3 times per week after a fall out of bed.  Estimated energy needs: 1800 calories 200 g carbohydrates 135 g protein 50 g fat  Progress Towards Goal(s): in progress   Nutritional Diagnosis:  NB-1.1 Food and nutrition-related knowledge deficit As related to Diabetes.  As evidenced by A1C 8.2%..    Intervention:  Nutrition counseling and diabetes education.. Plan:  Aim for 3-4 Carb Choices per meal (45-60 grams) +/- 1 either way  Avoid snacks between meals  Include protein in moderation with your meals Consider  increasing your activity level by 60 minutes of cardio (walking or riding bike)  daily as  tolerated Measure foods out for portion control Increase fresh fruits and vegetables. Test blood sugars before meals and at bedtime and record on BS log. Consider taking medication  as directed by MD        Will share BS log with Dr. [name] and see if he wants to adjust insulin due to some low blood sugars.  Keep fast acting carbohydrate sources with you at all times.  Teaching Method Utilized:  Visual Auditory Hands on  Handouts given during visit include:  The Plate Method  Meal Plan Card  Barriers to learning/adherence to lifestyle change: none  Demonstrated degree of understanding via:  Teach Back   Monitoring/Evaluation:  Dietary intake, exercise, meal planning, SBG, and body weight in 1 month(s).

## 2013-12-30 DIAGNOSIS — Z0289 Encounter for other administrative examinations: Secondary | ICD-10-CM

## 2013-12-31 DIAGNOSIS — M25551 Pain in right hip: Secondary | ICD-10-CM | POA: Diagnosis not present

## 2013-12-31 DIAGNOSIS — M6281 Muscle weakness (generalized): Secondary | ICD-10-CM | POA: Diagnosis not present

## 2013-12-31 DIAGNOSIS — M25571 Pain in right ankle and joints of right foot: Secondary | ICD-10-CM | POA: Diagnosis not present

## 2013-12-31 DIAGNOSIS — M25671 Stiffness of right ankle, not elsewhere classified: Secondary | ICD-10-CM | POA: Diagnosis not present

## 2013-12-31 NOTE — Patient Instructions (Signed)
Plan:  Aim for 3-4 Carb Choices per meal (45-60 grams) +/- 1 either way  Avoid snacks between meals  Include protein in moderation with your meals Consider  increasing your activity level by 60 minutes of cardio (walking or riding bike)  daily as tolerated Measure foods out for portion control Increase fresh fruits and vegetables. Test blood sugars before meals and at bedtime and record on BS log. Consider taking medication  as directed by MD        Will share BS log with Dr. [name] and see if he wants to adjust insulin due to some low blood sugars.  Keep fast acting carbohydrate sources with you at all times.

## 2014-01-01 DIAGNOSIS — M25561 Pain in right knee: Secondary | ICD-10-CM | POA: Diagnosis not present

## 2014-01-01 DIAGNOSIS — M25671 Stiffness of right ankle, not elsewhere classified: Secondary | ICD-10-CM | POA: Diagnosis not present

## 2014-01-01 DIAGNOSIS — M25551 Pain in right hip: Secondary | ICD-10-CM | POA: Diagnosis not present

## 2014-01-01 DIAGNOSIS — M6281 Muscle weakness (generalized): Secondary | ICD-10-CM | POA: Diagnosis not present

## 2014-01-01 DIAGNOSIS — M25571 Pain in right ankle and joints of right foot: Secondary | ICD-10-CM | POA: Diagnosis not present

## 2014-01-02 HISTORY — PX: EYE SURGERY: SHX253

## 2014-01-05 DIAGNOSIS — M25671 Stiffness of right ankle, not elsewhere classified: Secondary | ICD-10-CM | POA: Diagnosis not present

## 2014-01-05 DIAGNOSIS — M6281 Muscle weakness (generalized): Secondary | ICD-10-CM | POA: Diagnosis not present

## 2014-01-05 DIAGNOSIS — M25551 Pain in right hip: Secondary | ICD-10-CM | POA: Diagnosis not present

## 2014-01-05 DIAGNOSIS — M25571 Pain in right ankle and joints of right foot: Secondary | ICD-10-CM | POA: Diagnosis not present

## 2014-01-08 ENCOUNTER — Ambulatory Visit (INDEPENDENT_AMBULATORY_CARE_PROVIDER_SITE_OTHER): Payer: Medicare Other | Admitting: Adult Health

## 2014-01-08 ENCOUNTER — Encounter: Payer: Self-pay | Admitting: Adult Health

## 2014-01-08 ENCOUNTER — Encounter: Payer: Self-pay | Admitting: Neurology

## 2014-01-08 VITALS — BP 111/64 | HR 66 | Ht 65.0 in | Wt 224.0 lb

## 2014-01-08 DIAGNOSIS — G4719 Other hypersomnia: Secondary | ICD-10-CM | POA: Diagnosis not present

## 2014-01-08 DIAGNOSIS — G2581 Restless legs syndrome: Secondary | ICD-10-CM | POA: Diagnosis not present

## 2014-01-08 DIAGNOSIS — G4733 Obstructive sleep apnea (adult) (pediatric): Secondary | ICD-10-CM | POA: Diagnosis not present

## 2014-01-08 DIAGNOSIS — Z9989 Dependence on other enabling machines and devices: Principal | ICD-10-CM

## 2014-01-08 NOTE — Patient Instructions (Signed)
Overall your download looks good.  Continue using the CPAP.  Continue using Requip and Klonopin for RLS. Continue Nuvigil for daytime sleepiness.  If your symptoms worsen or you develop new symptoms please let us know.

## 2014-01-08 NOTE — Progress Notes (Signed)
PATIENT: Paul Baker DOB: Jun 17, 1950  REASON FOR VISIT: follow up HISTORY FROM: patient CHIEF COMPLAINT: OSA on CPAP, RLS, and daytime sleepiness  HISTORY OF PRESENT ILLNESS: Paul Baker is a 64 year old male with a history of obstructive sleep Apnea. He returns today for a 90 day compliance download. He brought his machine with him today and his reports shows an AHI of 0.6 with pressure set between 6-16 cm of water, uses his machine for 5 hours and 46 minutes a night, with 90 %compliance. His Epworth score is 20  points was previously 22 points. His fatigue severity score is 36 was previously 43.  Patient reports that he gets about 5-8 hours of sleep a night. He goes to bed around 9:00PM and arises at 4:45AM. He denies having trouble falling asleep or staying a sleep. States that the gets up about 2 times a night to urinate. Overall patient feels that CPAP has improved his sleepiness and fatigue. Patient states that he has a daily headache that has been going on for years. He states that they are not severe. He states that he will take tylenol and sometimes it helps and other times it does not. The patient uses Requip 6 mg and Klonopin 2 mg for RLS. He states that definitely helps. He does have to use hydrocodone when it is severe. He also takes [place] and reports that works well but in the afternoon he gets sleepy. He states that he takes 150 mg in the AM and will take a 50 mg tablet in the afternoon only if he has to go somewhere.   HISTORY 10/08/13 [facility]): Compliance visit:  The download from [id number] revealed an 11.5 cm water pressure need, average daily used to time 4 hours 41 minutes, the patient used the machine on 27 of 30 days. The patient used the machine 4 hours on 20 of 30 days. Compliance over 4 hours or 66%. Residual AHI of 0.5.  UPDATE 04/02/13 (LL): Paul Baker returns for follow up visit. He brings his Cpap machine today and the download provides a reprot for  25 days. Compliance is around 50% He has reduced compliance this time because he was in the hospitital twice for hyperglycemia and also he states his mask is not fitting well, but he does use it religiously. He plans on getting a new one soon. He states that [name] is wearing off in the mid afternoons, around 2-3 pm.  He is so sleepy that he has fallen asleep during conversation. His Epworth Score today is 22. FSS is 43 points. He is exhausted, he is desperate. Rises at 4.30 AM and goes to bed at 10 PM. By midnight he wakes and often cannot resume sleep for several hours.   This patient with severe obstructive sleep apnea followed by Dr. Krista Blue. AHI 62 titrated to 16 cm water. DME was [employer] , PCP Dr.-Luking.  Apnea cannot longer be the cause for his excessive daytime sleepiness, I will try to adjust his medication treatment for restless leg syndrome.   05/31/12 (CD):The patient returns today for a revisit. He has been using a CPAP With variable pressure settings. I reviewed his CPAP a little titration from the date 04/05/2012 through 05/04/2012. His 95th percentile pressure is 14 cm, his AHI is 0.6. I consider his apnea optimally treated. Average daily usages 5 hours and 20 minutes for the last 30 days he would be 100% compliant by Medicare criteria.  Annual download dated 34-17  2014 for a 35 day. Shows that the patient used 100% of all recorded base CPAP with a minimum pressure of 6 cm maximum pressure of 16 cm in his 95th percentile pressure was 14.6 cm water his residual AHI is 1.2. Average usage time is 5 hours 6 minutes. It is my impression that the patient apnea cannot be responsible for his daytime sleepiness anymore.  We are now discussing possible narcolepsy versus severe impairment from the restless legs causing this excessive daytime sleepiness. I have tried the patient on NUPRO Patch at 2 strength , each did not help him to fall asleep easier or maintain sleep.  Epworth  sleepiness score of was endorsed still at 20 points ( while using CPAP). Today it is 18 points, still highly abnormal on high dose dopaminergic medication. He endorses RLS with myoclonus , and may respond to Klonipin. .  The patient had a severe nasal sinus infection on 08-15-12 MRSA . Renaxa can't be used with NUVIGIL and he was weaned off Renaxa. He should be able to use it now.  Obtained an anemia panel to complete has review of metabolic factors contributing to restless leg syndrome. The patient is aware that his risk factors include thyroid disease, diabetic neuropathy, but also had worsening of restless legs after a knee surgery possibly due to blood loss.  100% compliance with CPAP. As discussed during visit. Normal FE , ferritin levels.  REVIEW OF SYSTEMS: Out of a complete 14 system review of symptoms, the patient complains only of the following symptoms, and all other reviewed systems are negative.  Activity change, ringing in ears, eye itching, chest tightness, chest pain, swelling, excessive thirst, abdominal pain, restless leg, sleep walking, frequency of urination, joint pain, joint swelling, back pain, dizziness, headache, numbness, nervousness/anxious  ALLERGIES: Allergies  Allergen Reactions  . Cardizem [Diltiazem Hcl]     Edema   . Cardura [Doxazosin Mesylate]     Side effects  . Lipitor [Atorvastatin] Other (See Comments)    Leg cramps  . Paxil [Paroxetine Hcl]     Side effects   . Adhesive [Tape] Rash  . Codeine Rash and Other (See Comments)    Headache     HOME MEDICATIONS: Outpatient Prescriptions Prior to Visit  Medication Sig Dispense Refill  . amLODipine (NORVASC) 2.5 MG tablet Take 2.5 mg by mouth daily.    [name] amLODipine (NORVASC) 5 MG tablet TAKE ONE TABLET BY MOUTH ONCE DAILY. 30 tablet 6  . aspirin 325 MG tablet Take 325 mg by mouth every evening.     . Canagliflozin (INVOKANA) 100 MG TABS Take 100 mg by mouth every morning.     . clobetasol (TEMOVATE)  0.05 % external solution Apply 1 application topically daily as needed. For irritation    . clomiPHENE (CLOMID) 50 MG tablet     . clonazePAM (KLONOPIN) 1 MG tablet For restless legs at night po, 1 tab 30 tablet 5  . fenofibrate 160 MG tablet TAKE (1) TABLET BY MOUTH AT BEDTIME. 30 tablet 5  . HUMALOG 100 UNIT/ML injection 20 Units 3 (three) times daily with meals. Patient is on a sliding scale    . HYDROcodone-acetaminophen (NORCO) 10-325 MG per tablet Take 1 tablet by mouth every 4 (four) hours as needed. 40 tablet 0  . ibuprofen (ADVIL,MOTRIN) 800 MG tablet Take 1 tablet (800 mg total) by mouth 3 (three) times daily. 21 tablet 0  . isosorbide mononitrate (IMDUR) 60 MG 24 hr tablet TAKE (1/2) TABLET BY MOUTH  TWICE DAILY. 60 tablet 3  . LEVEMIR 100 UNIT/ML injection Inject 60 Units into the skin 2 (two) times daily.     [name] levothyroxine (SYNTHROID, LEVOTHROID) 75 MCG tablet daily.    [name] loratadine (CLARITIN) 10 MG tablet Take 10 mg by mouth at bedtime.    [name] losartan (COZAAR) 100 MG tablet TAKE ONE TABLET BY MOUTH ONCE DAILY. 90 tablet 1  . metFORMIN (GLUCOPHAGE) 500 MG tablet TAKE 2 TABLETS EACH MORNING, 1 TABLET AT NOON, AND 2 TABLETS EVERY EVENING. 150 tablet 6  . metoprolol succinate (TOPROL-XL) 50 MG 24 hr tablet Take 1 tablet (50 mg total) by mouth daily. Take with or immediately following a meal. 60 tablet 6  . Multiple Vitamins-Minerals (MULTIVITAMIN WITH MINERALS) tablet Take 1 tablet by mouth daily.      [name] NITROSTAT 0.4 MG SL tablet DISSOLVE 1 TABLET UNDER TONGUE EVERY 5 MINUTES UP TO 15 MIN FOR CHESTPAIN. IF NO RELIEF CALL 911. 25 tablet 3  . NUVIGIL 50 MG tablet TAKE 2 TABLETS BY MOUTH IN THE MORNING AND 1 TABLET IN THE MID-AFTERNOON. 90 tablet 5  . omeprazole (PRILOSEC) 20 MG capsule TAKE (1) CAPSULE BY MOUTH TWICE DAILY. 60 capsule 5  . ONE TOUCH ULTRA TEST test strip     . pravastatin (PRAVACHOL) 20 MG tablet Take 1 tablet (20 mg total) by mouth daily. 90 tablet 1  . predniSONE  (DELTASONE) 10 MG tablet 4 tablets a day for 3 days, then 3 tablets a day for 3 days then 2 tablets a day for 2 days 25 tablet 0  . rOPINIRole (REQUIP) 4 MG tablet TAKE 1 TO 1 1/2 TABLET BY MOUTH AT BEDTIME. 45 tablet 6  . triamterene-hydrochlorothiazide (MAXZIDE-25) 37.5-25 MG per tablet TAKE ONE TABLET BY MOUTH ONCE DAILY. 90 tablet 1  . VENTOLIN HFA 108 (90 BASE) MCG/ACT inhaler Inhale 1 puff into the lungs as needed for wheezing or shortness of breath.      No facility-administered medications prior to visit.    PAST MEDICAL HISTORY: Past Medical History  Diagnosis Date  . Arthritis   . Type 2 diabetes mellitus   . Mixed hyperlipidemia   . Essential hypertension, benign   . Septic arthritis of knee, left   . OSA (obstructive sleep apnea)   . Coronary atherosclerosis of native coronary artery     Diseased nondominant RCA  . Pulmonary embolism 2004  . DVT (deep venous thrombosis)   . Asthma   . Rotator cuff disorder   . Colon polyp   . Morbid obesity   . RLS (restless legs syndrome)   . Hypersomnia     CPAP of 16 cm, diagnosed with AHI of 60 in 2012,epworth 21- narcolepsy?  . Cataplexy   . MRSA (methicillin resistant staph aureus) culture positive     08/2012  . Narcolepsy     PAST SURGICAL HISTORY: Past Surgical History  Procedure Laterality Date  . Total knee arthroplasty    . Lumbar disc surgery      Left L3, L4, L5 discecotomy with decompression of L4 root  . Wrist surgery    . Total knee revision    . Colonoscopy    . Back surgery      FAMILY HISTORY: Family History  Problem Relation Age of Onset  . Hypertension Father   . Heart attack Father   . Kidney Stones Father   . Diabetes Sister   . Seizures Grandchild   . Narcolepsy Grandchild  SOCIAL HISTORY: History   Social History  . Marital Status: Married    Spouse Name: Toney Reil    Number of Children: 2  . Years of Education: college   Occupational History  . Disabled   .     Social History  Main Topics  . Smoking status: Former Smoker    Types: Cigarettes    Quit date: 01/03/1995  . Smokeless tobacco: Never Used  . Alcohol Use: No     Comment: quit drinking in 07/86  . Drug Use: No  . Sexual Activity: Not on file   Other Topics Concern  . Not on file   Social History Narrative    64 year old, right-handed, caucasian male with a past medical history of obesity, hypertension, hyperlipidemia, diabetes, obstructive sleep apnea, presenting with frequent nighttime awakenings, excessive daytime sleepiness, also transient confusional episodes.RLS and one beosity, OSA on CPAP with AHI of 3.2 and  setting of 16 cm water , [facility] .      PHYSICAL EXAM  Filed Vitals:   01/08/14 0943  Height: 5\' 5"  (1.651 m)  Weight: 224 lb (101.606 kg)   Body mass index is 37.28 kg/(m^2).  Generalized: Well developed, in no acute distress  Neck: Circumference 20.5 inches, Mallampati 4+  Neurological examination  Mentation: Alert oriented to time, place, history taking. Follows all commands speech and language fluent Cranial nerve II-XII: Pupils were equal round reactive to light. Extraocular movements were full, visual field were full on confrontational test. Facial sensation and strength were normal. Uvula tongue midline. Head turning and shoulder shrug  were normal and symmetric. Motor: The motor testing reveals 5 over 5 strength of all 4 extremities. Good symmetric motor tone is noted throughout.  Sensory: Sensory testing is intact to soft touch on all 4 extremities. No evidence of extinction is noted.  Coordination: Cerebellar testing reveals good finger-nose-finger and heel-to-shin bilaterally.  Gait and station: Patient has a slight limp on the right due to knee pain. Tandem gait unsteady Romberg is negative. No drift is seen.  Reflexes: Deep tendon reflexes are symmetric but decreased throughout   DIAGNOSTIC DATA (LABS, IMAGING, TESTING) - I reviewed patient records, labs,  notes, testing and imaging myself where available.  Lab Results  Component Value Date   WBC 6.7 05/13/2013   HGB 12.7* 05/13/2013   HCT 38.1* 05/13/2013   MCV 87.4 05/13/2013   PLT 286 05/13/2013      Component Value Date/Time   NA 141 10/08/2013 1032   NA 139 05/13/2013 1121   K 4.3 10/08/2013 1032   CL 99 10/08/2013 1032   CO2 21 10/08/2013 1032   GLUCOSE 204* 10/08/2013 1032   GLUCOSE 128* 05/13/2013 1121   BUN 28* 10/08/2013 1032   BUN 22 05/13/2013 1121   CREATININE 1.38* 10/08/2013 1032   CREATININE 1.20 05/13/2013 1121   CALCIUM 9.7 10/08/2013 1032   PROT 6.7 10/08/2013 1032   PROT 7.6 05/13/2013 1121   ALBUMIN 3.9 05/13/2013 1121   AST 17 10/08/2013 1032   ALT 22 10/08/2013 1032   ALKPHOS 49 10/08/2013 1032   BILITOT <0.2 10/08/2013 1032   GFRNONAA 54* 10/08/2013 1032   GFRAA 63 10/08/2013 1032   Lab Results  Component Value Date   CHOL 211* 11/13/2013   HDL 49 11/13/2013   LDLCALC 131* 11/13/2013   TRIG 155* 11/13/2013   CHOLHDL 4.3 11/13/2013   Lab Results  Component Value Date   HGBA1C 8.5 07/23/2012    Lab Results  Component  Value Date   TSH 4.990* 04/19/2012      ASSESSMENT AND PLAN 64 y.o. year old male  has a past medical history of Arthritis; Type 2 diabetes mellitus; Mixed hyperlipidemia; Essential hypertension, benign; Septic arthritis of knee, left; OSA (obstructive sleep apnea); Coronary atherosclerosis of native coronary artery; Pulmonary embolism (2004); DVT (deep venous thrombosis); Asthma; Rotator cuff disorder; Colon polyp; Morbid obesity; RLS (restless legs syndrome); Hypersomnia; Cataplexy; MRSA (methicillin resistant staph aureus) culture positive; and Narcolepsy. here with:  1. Obstructive sleep apnea on CPAP 2. Restless leg syndrome 3. Excessive daytime sleepiness  Overall the patient is doing well. His CPAP download was excellent today. Patient states that his restless leg is controlled with Requip and Klonopin and  occasionally uses hydrocodone for severe discomfort. He continues to use Nuvigil for daytime sleepiness and states that it works well for him. No refills needed today. If his symptoms worsen or he develops new symptoms he should let us know. Otherwise he will follow up in 6 months or sooner if needed.   Ward Givens, MSN, NP-C 01/08/2014, 9:52 AM [facility] [address], [place], [place] [address] [phone]  Note: This document was prepared with digital dictation and possible smart phrase technology. Any transcriptional errors that result from this process are unintentional.

## 2014-01-08 NOTE — Progress Notes (Signed)
I agree with the assessment and plan as directed by NP .The patient is known to me .   Azadeh Hyder, MD  

## 2014-01-08 NOTE — Addendum Note (Signed)
Addended by: Trudie Buckler on: 01/08/2014 11:24 AM   Modules accepted: Level of Service

## 2014-01-20 DIAGNOSIS — M1711 Unilateral primary osteoarthritis, right knee: Secondary | ICD-10-CM | POA: Diagnosis not present

## 2014-01-20 DIAGNOSIS — M238X1 Other internal derangements of right knee: Secondary | ICD-10-CM | POA: Diagnosis not present

## 2014-01-27 DIAGNOSIS — M1711 Unilateral primary osteoarthritis, right knee: Secondary | ICD-10-CM | POA: Diagnosis not present

## 2014-02-02 ENCOUNTER — Encounter: Payer: Medicare Other | Admitting: Nutrition

## 2014-02-18 ENCOUNTER — Ambulatory Visit (INDEPENDENT_AMBULATORY_CARE_PROVIDER_SITE_OTHER): Payer: Medicare Other | Admitting: Cardiology

## 2014-02-18 ENCOUNTER — Other Ambulatory Visit: Payer: Self-pay | Admitting: Neurology

## 2014-02-18 ENCOUNTER — Encounter: Payer: Self-pay | Admitting: Cardiology

## 2014-02-18 VITALS — BP 136/72 | HR 84 | Ht 65.0 in | Wt 221.0 lb

## 2014-02-18 DIAGNOSIS — Z713 Dietary counseling and surveillance: Secondary | ICD-10-CM | POA: Diagnosis not present

## 2014-02-18 DIAGNOSIS — E1122 Type 2 diabetes mellitus with diabetic chronic kidney disease: Secondary | ICD-10-CM | POA: Diagnosis not present

## 2014-02-18 DIAGNOSIS — I25119 Atherosclerotic heart disease of native coronary artery with unspecified angina pectoris: Secondary | ICD-10-CM

## 2014-02-18 DIAGNOSIS — E038 Other specified hypothyroidism: Secondary | ICD-10-CM | POA: Diagnosis not present

## 2014-02-18 DIAGNOSIS — E782 Mixed hyperlipidemia: Secondary | ICD-10-CM | POA: Diagnosis not present

## 2014-02-18 DIAGNOSIS — I10 Essential (primary) hypertension: Secondary | ICD-10-CM

## 2014-02-18 DIAGNOSIS — E559 Vitamin D deficiency, unspecified: Secondary | ICD-10-CM | POA: Diagnosis not present

## 2014-02-18 DIAGNOSIS — Z01810 Encounter for preprocedural cardiovascular examination: Secondary | ICD-10-CM | POA: Diagnosis not present

## 2014-02-18 NOTE — Progress Notes (Signed)
Cardiology Office Note  Date: 02/18/2014   ID: Paul Baker, DOB 05-30-1950, MRN [id number]  PCP: Rubbie Battiest, MD  Primary Cardiologist: [name], MD   Chief Complaint  Patient presents with  . Coronary Artery Disease  . Hypertension  . Hyperlipidemia  . Preoperative evaluation    History of Present Illness: Paul Baker is a 64 y.o. male last seen in August 2015. He presents for a follow-up visit today, also for preoperative assessment. He is scheduled to undergo elective right knee replacement with Dr. Alvan Dame in March. From a cardiac perspective he remains stable, angina symptoms requiring nitroglycerin perhaps once a month. Despite this he is able to exercise regularly at the gym, at this point is limited by his knee pain however. He still has been doing upper body exercises. Follow-up ECG is noted below.  Follow-up noted with [facility] in January with overall good reported control of OSA and restless leg syndrome. He reports compliance with CPAP.  Blood pressure is reasonable today, no major changes noted in his medications which include Maxide, Toprol-XL, and Cozaar.  He continues on Pravachol without obvious side effects.  We have been managing him medically with history of CAD involving a small nondominant RCA as detailed below.   Past Medical History  Diagnosis Date  . Arthritis   . Type 2 diabetes mellitus   . Mixed hyperlipidemia   . Essential hypertension, benign   . Septic arthritis of knee, left   . OSA (obstructive sleep apnea)   . Coronary atherosclerosis of native coronary artery     Diseased nondominant RCA  . Pulmonary embolism 2004  . DVT (deep venous thrombosis)   . Asthma   . Rotator cuff disorder   . Colon polyp   . Morbid obesity   . RLS (restless legs syndrome)   . Hypersomnia     CPAP of 16 cm, diagnosed with AHI of 60 in 2012,epworth 21- narcolepsy?  [name] MRSA (methicillin resistant staph aureus) culture  positive     08/2012  . Narcolepsy     Past Surgical History  Procedure Laterality Date  . Total knee arthroplasty    . Lumbar disc surgery      Left L3, L4, L5 discecotomy with decompression of L4 root  . Wrist surgery    . Total knee revision    . Colonoscopy    . Back surgery      Current Outpatient Prescriptions  Medication Sig Dispense Refill  . amLODipine (NORVASC) 2.5 MG tablet Take 2.5 mg by mouth daily.    [name] amLODipine (NORVASC) 5 MG tablet TAKE ONE TABLET BY MOUTH ONCE DAILY. 30 tablet 6  . aspirin 325 MG tablet Take 325 mg by mouth every evening.     . Canagliflozin (INVOKANA) 100 MG TABS Take 100 mg by mouth every morning.     . clobetasol (TEMOVATE) 0.05 % external solution Apply 1 application topically daily as needed. For irritation    . clomiPHENE (CLOMID) 50 MG tablet     . clonazePAM (KLONOPIN) 1 MG tablet For restless legs at night po, 1 tab 30 tablet 5  . fenofibrate 160 MG tablet TAKE (1) TABLET BY MOUTH AT BEDTIME. 30 tablet 5  . HUMALOG 100 UNIT/ML injection 20 Units 3 (three) times daily with meals. Patient is on a sliding scale    . HYDROcodone-acetaminophen (NORCO) 10-325 MG per tablet Take 1 tablet by mouth every 4 (four) hours as needed. 40 tablet 0  .  ibuprofen (ADVIL,MOTRIN) 800 MG tablet Take 1 tablet (800 mg total) by mouth 3 (three) times daily. 21 tablet 0  . isosorbide mononitrate (IMDUR) 60 MG 24 hr tablet TAKE (1/2) TABLET BY MOUTH TWICE DAILY. 60 tablet 3  . LEVEMIR 100 UNIT/ML injection Inject 60 Units into the skin 2 (two) times daily.     [name] levothyroxine (SYNTHROID, LEVOTHROID) 75 MCG tablet daily.    [name] loratadine (CLARITIN) 10 MG tablet Take 10 mg by mouth at bedtime.    [name] losartan (COZAAR) 100 MG tablet TAKE ONE TABLET BY MOUTH ONCE DAILY. 90 tablet 1  . metFORMIN (GLUCOPHAGE) 500 MG tablet TAKE 2 TABLETS EACH MORNING, 1 TABLET AT NOON, AND 2 TABLETS EVERY EVENING. 150 tablet 6  . metoprolol succinate (TOPROL-XL) 50 MG 24 hr tablet Take 1  tablet (50 mg total) by mouth daily. Take with or immediately following a meal. 60 tablet 6  . Multiple Vitamins-Minerals (MULTIVITAMIN WITH MINERALS) tablet Take 1 tablet by mouth daily.      [name] NITROSTAT 0.4 MG SL tablet DISSOLVE 1 TABLET UNDER TONGUE EVERY 5 MINUTES UP TO 15 MIN FOR CHESTPAIN. IF NO RELIEF CALL 911. 25 tablet 3  . NUVIGIL 50 MG tablet TAKE 2 TABLETS BY MOUTH IN THE MORNING AND 1 TABLET IN THE MID-AFTERNOON. 90 tablet 5  . omeprazole (PRILOSEC) 20 MG capsule TAKE (1) CAPSULE BY MOUTH TWICE DAILY. 60 capsule 5  . ONE TOUCH ULTRA TEST test strip     . pravastatin (PRAVACHOL) 20 MG tablet Take 1 tablet (20 mg total) by mouth daily. 90 tablet 1  . predniSONE (DELTASONE) 10 MG tablet 4 tablets a day for 3 days, then 3 tablets a day for 3 days then 2 tablets a day for 2 days 25 tablet 0  . rOPINIRole (REQUIP) 4 MG tablet TAKE 1 TO 1 1/2 TABLET BY MOUTH AT BEDTIME. 45 tablet 6  . triamterene-hydrochlorothiazide (MAXZIDE-25) 37.5-25 MG per tablet TAKE ONE TABLET BY MOUTH ONCE DAILY. 90 tablet 1  . VENTOLIN HFA 108 (90 BASE) MCG/ACT inhaler Inhale 1 puff into the lungs as needed for wheezing or shortness of breath.      No current facility-administered medications for this visit.    Allergies:  Cardizem; Cardura; Lipitor; Paxil; Adhesive; and Codeine   Social History: The patient  reports that he quit smoking about 19 years ago. His smoking use included Cigarettes. He started smoking about 46 years ago. He has never used smokeless tobacco. He reports that he does not drink alcohol or use illicit drugs.   Family History: The patient's family history includes Diabetes in his sister; Heart attack in his father; Hypertension in his father; Kidney Stones in his father; Narcolepsy in his grandchild; Seizures in his grandchild.   ROS:  Please see the history of present illness. Otherwise, complete review of systems is positive for bilateral knee pain, right worse than left. Currently using a  brace. Had exacerbation of right knee pain after a fall..  All other systems are reviewed and negative.    Physical Exam: VS:  BP 136/72 mmHg  Pulse 84  Ht 5\' 5"  (1.651 m)  Wt 221 lb (100.245 kg)  BMI 36.78 kg/m2  SpO2 95%, BMI Body mass index is 36.78 kg/(m^2).  Wt Readings from Last 3 Encounters:  02/18/14 221 lb (100.245 kg)  01/08/14 224 lb (101.606 kg)  12/29/13 221 lb (100.245 kg)     Overweight male, appears comfortable at rest. HEENT: Conjunctiva and lids normal,  oropharynx clear. Neck: Supple, no elevated JVP or carotid bruits, no thyromegaly. Lungs: Clear to auscultation, nonlabored breathing at rest. Cardiac: Regular rate and rhythm, no S3 or significant systolic murmur, no pericardial rub. Abdomen: Soft, nontender, bowel sounds present, no guarding or rebound. Extremities: No pitting edema, right knee in brace, distal pulses 2+. Skin: Warm and dry. Musculoskeletal: No kyphosis. Neuropsychiatric: Alert and oriented x3, affect grossly appropriate.    ECG: ECG is ordered today and reviewed finding normal sinus rhythm.  Recent Labwork: 05/13/2013: Hemoglobin 12.7*; Platelets 286 10/08/2013: ALT 22; AST 17; BUN 28*; Creatinine 1.38*; Potassium 4.3; Sodium 141     Component Value Date/Time   CHOL 211* 11/13/2013 0843   TRIG 155* 11/13/2013 0843   HDL 49 11/13/2013 0843   CHOLHDL 4.3 11/13/2013 0843   VLDL 31 11/13/2013 0843   LDLCALC 131* 11/13/2013 0843    Other Studies Reviewed Today:  Cardiac catheterization in December 2011 demonstrated a 90% proximal RCA stenosis in a small nondominant vessel, otherwise no significant flow limiting left-sided disease - only 20% proximal LAD noted.   ASSESSMENT AND PLAN:  1. Preoperative cardiac evaluation prior to elective right knee replacement with Dr. Alvan Dame in March. Patient has known CAD involving a small nondominant RCA that has been managed medically. He has stable angina symptoms on current medical regimen, continues  to exercise as tolerated at the gym. ECG today is normal. He has had prior documentation of normal LVEF, and no active heart failure symptoms. I expect that he should be able to proceed with planned surgery at an acceptable perioperative cardiac risk. No further cardiac testing planned at this time. He will be holding aspirin for surgery, would otherwise continue his regular cardiac regimen.  2. Essential hypertension, blood pressure is reasonable today. No changes made in current regimen.  3. Hyperlipidemia, on Pravachol.  4. CAD with diseased, small nondominant RCA that has been managed medically.  Current medicines are reviewed at length with the patient today.  The patient does not have concerns regarding medicines.   Orders Placed This Encounter  Procedures  . EKG 12-Lead    Disposition: FU with me in 6 months.   Signed, Satira Sark, MD, [facility] 02/18/2014 10:16 AM    [facility] [address] [place], [place] [address] Phone: [phone]; Fax: [phone]

## 2014-02-18 NOTE — Patient Instructions (Signed)
Your physician wants you to follow-up in: 6 months [place].  You will receive a reminder letter in the mail two months in advance. If you don't receive a letter, please call our office to schedule the follow-up appointment.  Your physician recommends that you continue on your current medications as directed. Please refer to the Current Medication list given to you today.  Thank you for choosing [facility]!

## 2014-02-19 DIAGNOSIS — M1711 Unilateral primary osteoarthritis, right knee: Secondary | ICD-10-CM | POA: Diagnosis not present

## 2014-02-20 ENCOUNTER — Encounter: Payer: Medicare Other | Admitting: Nutrition

## 2014-02-20 NOTE — Telephone Encounter (Signed)
I do not think I was ever involved in this patient's care. He was seen by Dr. Brett Fairy in this practice before, however, he is asking for narcotics which I think his PCP should have noted that. Please let me know if there is any questions. thank you.  Rosalin Hawking, MD PhD Stroke Neurology 02/20/2014 2:54 PM

## 2014-02-23 ENCOUNTER — Ambulatory Visit (INDEPENDENT_AMBULATORY_CARE_PROVIDER_SITE_OTHER): Payer: Medicare Other | Admitting: Family Medicine

## 2014-02-23 ENCOUNTER — Encounter: Payer: Self-pay | Admitting: Family Medicine

## 2014-02-23 VITALS — BP 118/80 | Temp 98.1°F | Ht 65.0 in | Wt 221.0 lb

## 2014-02-23 DIAGNOSIS — I25119 Atherosclerotic heart disease of native coronary artery with unspecified angina pectoris: Secondary | ICD-10-CM

## 2014-02-23 DIAGNOSIS — J310 Chronic rhinitis: Secondary | ICD-10-CM

## 2014-02-23 DIAGNOSIS — J329 Chronic sinusitis, unspecified: Secondary | ICD-10-CM | POA: Diagnosis not present

## 2014-02-23 MED ORDER — AMOXICILLIN-POT CLAVULANATE 875-125 MG PO TABS: 1.0000 | ORAL_TABLET | Freq: Two times a day (BID) | ORAL | Status: AC

## 2014-02-23 NOTE — Telephone Encounter (Signed)
Pt is seen by his PCP today. He should ask his PCP for narcotics. I have declined the prescription from our side. Thanks.  Rosalin Hawking, MD PhD Stroke Neurology 02/23/2014 5:13 PM

## 2014-02-23 NOTE — Progress Notes (Signed)
   Subjective:    Patient ID: Paul Baker, male    DOB: 02-May-1950, 64 y.o.   MRN: [id number]  HPI Comments: Knee replacement surgery on March 21  Sinusitis This is a new problem. Episode onset: 3 weeks ago. The problem has been waxing and waning since onset. There has been no fever. Associated symptoms include congestion, coughing, headaches, sinus pressure and a sore throat. Past treatments include nothing.    Two wks back  Frontal headache pressure  diminshed energy  Drained down throat  Throat is more uncomf in the morning  Right knee rep with dr Althea Grimmer   Review of Systems  HENT: Positive for congestion, sinus pressure and sore throat.   Respiratory: Positive for cough.   Neurological: Positive for headaches.       Objective:   Physical Exam Alert no acute distress vital stable. H&T moderate nasal congestion frontal tenderness pharynx normal lungs clear heart regular in rhythm       Assessment & Plan:  Impression acute rhinosinusitis plan antibiotics prescribed. Symptomatic care discussed. Warning signs discussed. WSL

## 2014-02-25 ENCOUNTER — Encounter: Payer: Medicare Other | Attending: "Endocrinology | Admitting: Nutrition

## 2014-02-25 VITALS — Ht 66.0 in | Wt 221.0 lb

## 2014-02-25 DIAGNOSIS — Z6836 Body mass index (BMI) 36.0-36.9, adult: Secondary | ICD-10-CM | POA: Diagnosis not present

## 2014-02-25 DIAGNOSIS — E6609 Other obesity due to excess calories: Secondary | ICD-10-CM | POA: Diagnosis not present

## 2014-02-25 DIAGNOSIS — E669 Obesity, unspecified: Secondary | ICD-10-CM

## 2014-02-25 DIAGNOSIS — E1165 Type 2 diabetes mellitus with hyperglycemia: Secondary | ICD-10-CM | POA: Insufficient documentation

## 2014-02-25 DIAGNOSIS — E782 Mixed hyperlipidemia: Secondary | ICD-10-CM | POA: Diagnosis not present

## 2014-02-25 DIAGNOSIS — E118 Type 2 diabetes mellitus with unspecified complications: Secondary | ICD-10-CM

## 2014-02-25 DIAGNOSIS — Z713 Dietary counseling and surveillance: Secondary | ICD-10-CM | POA: Insufficient documentation

## 2014-02-25 DIAGNOSIS — E1310 Other specified diabetes mellitus with ketoacidosis without coma: Secondary | ICD-10-CM | POA: Insufficient documentation

## 2014-02-25 DIAGNOSIS — Z79899 Other long term (current) drug therapy: Secondary | ICD-10-CM | POA: Diagnosis not present

## 2014-02-25 DIAGNOSIS — I10 Essential (primary) hypertension: Secondary | ICD-10-CM | POA: Diagnosis not present

## 2014-02-25 DIAGNOSIS — R972 Elevated prostate specific antigen [PSA]: Secondary | ICD-10-CM | POA: Diagnosis not present

## 2014-02-25 DIAGNOSIS — E291 Testicular hypofunction: Secondary | ICD-10-CM | POA: Diagnosis not present

## 2014-02-25 DIAGNOSIS — IMO0002 Reserved for concepts with insufficient information to code with codable children: Secondary | ICD-10-CM

## 2014-02-25 DIAGNOSIS — Z125 Encounter for screening for malignant neoplasm of prostate: Secondary | ICD-10-CM | POA: Diagnosis not present

## 2014-02-25 NOTE — Patient Instructions (Signed)
Plan:  Aim for 3-4 Carb Choices per meal (45-60 grams) +/- 1 either way  Avoid snacks between meals  Cut out snacks or any foods between meals and before breakfast Increase low carb vegetables. Get premeal BS to remain <130 and less than 180 2 hrs after. Cut down on portions and follow carb restriction. Lose 1 lb per week til next visit. Get A1C down to 8% int three months.

## 2014-02-25 NOTE — Progress Notes (Signed)
  Medical Nutrition Therapy:  Appt start time: end time:  1100.   Assessment:  Primary concerns today: Diabetes.  A1C was 8.2%. Has measuring scales to measure foods out. He notes he has been trying to cut out unnecessary foods. LImited with physical activity due to needing a knee replacement. Still works out daily but not as much cardio as weight lifting. Eating before working out and then breakfast afterwards creating excess calories.    Weight hasn't changed much.  BS log brought in. He is having some better blood sugars at times but then overeats and BS are elevated after meals. A1C down to 8.6 from 8.9%. Still excessive energy intake. Has been trying muscle milk before workouts. No food journal brought.. He forgot it.    Suspect excessive food intake leading to lack of weight loss and elevated BS.  Preferred Learning Style:   Auditory  Visual  Hands on  Learning Readiness:     Ready  Change in progress   MEDICATIONS: see list   DIETARY INTAKE: B) 1 egg sandwich on ww toast, coffee 1/2 of a muscle milk before working out. 4 oz L) Chicken sliced on ww bread, l/t/o/o with Diet Coke D) Sloppy Joes, 1 slice bread, 6 tator tots and 2 helping green beans, water Water: 3-6 bottes (16.9 oz)  Usual physical activity: Works out a few times per week Limited due to needing a knee replacement.  Estimated energy needs: 1800 calories 200 g carbohydrates 135 g protein 50 g fat  Progress Towards Goal(s): in progress   Nutritional Diagnosis:  NB-1.1 Food and nutrition-related knowledge deficit As related to Diabetes.  As evidenced by A1C 8.6%..    Intervention:  Nutrition counseling and diabetes education.. Plan:  Aim for 3-4 Carb Choices per meal (45-60 grams) +/- 1 either way  Avoid snacks between meals  Cut out snacks or any foods between meals and before breakfast Increase low carb vegetables. Get premeal BS to remain <130 and less than 180 2 hrs after. Cut down on  portions and follow carb restriction. Lose 1 lb per week til next visit. Get A1C down to 8% int three months.  Teaching Method Utilized:  Visual Auditory Hands on  Handouts given during visit include:  The Plate Method  Meal Plan Card  Barriers to learning/adherence to lifestyle change: none  Demonstrated degree of understanding via:  Teach Back   Monitoring/Evaluation:  Dietary intake, exercise, meal planning, SBG, and body weight in 1 month(s).

## 2014-03-04 DIAGNOSIS — Z125 Encounter for screening for malignant neoplasm of prostate: Secondary | ICD-10-CM | POA: Diagnosis not present

## 2014-03-04 DIAGNOSIS — R972 Elevated prostate specific antigen [PSA]: Secondary | ICD-10-CM | POA: Diagnosis not present

## 2014-03-04 DIAGNOSIS — E291 Testicular hypofunction: Secondary | ICD-10-CM | POA: Diagnosis not present

## 2014-03-04 DIAGNOSIS — N5201 Erectile dysfunction due to arterial insufficiency: Secondary | ICD-10-CM | POA: Diagnosis not present

## 2014-03-05 ENCOUNTER — Other Ambulatory Visit: Payer: Self-pay | Admitting: Neurology

## 2014-03-06 ENCOUNTER — Encounter: Payer: Self-pay | Admitting: *Deleted

## 2014-03-06 NOTE — Telephone Encounter (Signed)
This encounter was created in error - please disregard.

## 2014-03-06 NOTE — H&P (Signed)
TOTAL KNEE ADMISSION H&P  Patient is being admitted for right total knee arthroplasty and removal of right tibial deep implant - staple.  Subjective:  Chief Complaint:   Right knee primary OA / pain and retained tibial staple.  HPI: Paul Baker, 64 y.o. male, has a history of pain and functional disability in the right knee due to trauma and has failed non-surgical conservative treatments for greater than 12 weeks to include NSAID's and/or analgesics, corticosteriod injections, viscosupplementation injections, use of assistive devices and activity modification.  Onset of symptoms was gradual, starting >10 years ago with gradually worsening course since that time. The patient noted prior procedures on the knee to include  arthroscopy on the right knee(s).  Patient currently rates pain in the right knee(s) at 10 out of 10 with activity. Patient has night pain, worsening of pain with activity and weight bearing, pain that interferes with activities of daily living, pain with passive range of motion, crepitus and joint swelling.  Patient has evidence of periarticular osteophytes and joint space narrowing by imaging studies.  There is no active infection.   Risks, benefits and expectations were discussed with the patient.  Risks including but not limited to the risk of anesthesia, blood clots, nerve damage, blood vessel damage, failure of the prosthesis, infection and up to and including death.  Patient understand the risks, benefits and expectations and wishes to proceed with surgery.   PCP: Rubbie Battiest, MD  D/C Plans:      Home with HHPT  Post-op Meds:       No Rx given  Tranexamic Acid:      To be given - topically    Decadron:      Is to be given  FYI:     Xarelto for 2 weeks, then ASA for 4 weeks.  (Previous DVT / PE and CAD)  Norco 10/325 1-2 4 hrs, post-op    Patient Active Problem List   Diagnosis Date Noted  . Spinal stenosis of cervicothoracic region 10/08/2013  . RLS (restless  legs syndrome) 10/08/2013  . DM (diabetes mellitus) type 2, uncontrolled, with ketoacidosis 10/08/2013  . Preoperative cardiovascular examination 08/14/2013  . Sleep apnea with use of continuous positive airway pressure (CPAP) 04/02/2013  . 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/02/2013  . Diabetic neuropathy 07/23/2012  . Obesity, morbid 05/01/2012  . Hypersomnia, persistent 05/01/2012  . OSA on CPAP 04/19/2012  . Essential hypertension, benign 12/16/2009  . CORONARY ATHEROSCLEROSIS NATIVE CORONARY ARTERY 12/16/2009  . Type 2 diabetes mellitus, uncontrolled 11/30/2009  . HYPERLIPIDEMIA 11/30/2009   Past Medical History  Diagnosis Date  . Arthritis   . Type 2 diabetes mellitus   . Mixed hyperlipidemia   . Essential hypertension, benign   . Septic arthritis of knee, left   . OSA (obstructive sleep apnea)   . Coronary atherosclerosis of native coronary artery     Diseased nondominant RCA  . Pulmonary embolism 2004  . DVT (deep venous thrombosis)   . Asthma   . Rotator cuff disorder   . Colon polyp   . Morbid obesity   . RLS (restless legs syndrome)   . Hypersomnia     CPAP of 16 cm, diagnosed with AHI of 60 in 2012,epworth 21- narcolepsy?  [name] MRSA (methicillin resistant staph aureus) culture positive     08/2012  . Narcolepsy     Past Surgical History  Procedure Laterality Date  . Total knee arthroplasty    . Lumbar disc surgery  Left L3, L4, L5 discecotomy with decompression of L4 root  . Wrist surgery    . Total knee revision    . Colonoscopy    . Back surgery      No prescriptions prior to admission   Allergies  Allergen Reactions  . Cardizem [Diltiazem Hcl]     Edema   . Cardura [Doxazosin Mesylate]     Side effects  . Lipitor [Atorvastatin] Other (See Comments)    Leg cramps  . Paxil [Paroxetine Hcl]     Side effects   . Adhesive [Tape] Rash  . Codeine Rash and Other (See Comments)    Headache     History  Substance Use Topics  .  Smoking status: Former Smoker    Types: Cigarettes    Start date: 06/19/1967    Quit date: 01/03/1995  . Smokeless tobacco: Never Used  . Alcohol Use: No     Comment: quit drinking in 07/86    Family History  Problem Relation Age of Onset  . Hypertension Father   . Heart attack Father   . Kidney Stones Father   . Diabetes Sister   . Seizures Grandchild   . Narcolepsy Grandchild      Review of Systems  Constitutional: Negative.   HENT: Negative.        Some sinus pressure, but being treated and getting better  Eyes: Negative.   Respiratory: Negative.   Cardiovascular: Negative.   Gastrointestinal: Negative.   Genitourinary: Negative.   Musculoskeletal: Positive for joint pain.  Skin: Negative.   Neurological: Negative.   Endo/Heme/Allergies: Negative.   Psychiatric/Behavioral: Negative.     Objective:  Physical Exam  Constitutional: He is oriented to person, place, and time. He appears well-developed and well-nourished.  HENT:  Head: Normocephalic and atraumatic.  Eyes: Pupils are equal, round, and reactive to light.  Neck: Neck supple. No JVD present. No tracheal deviation present. No thyromegaly present.  Cardiovascular: Normal rate and intact distal pulses.   Respiratory: Effort normal and breath sounds normal. No stridor. No respiratory distress. He has no wheezes.  GI: Soft. There is no tenderness. There is no guarding.  Musculoskeletal:       Right knee: He exhibits decreased range of motion, swelling and bony tenderness. He exhibits no ecchymosis, no deformity, no laceration and no erythema. Tenderness found.  Lymphadenopathy:    He has no cervical adenopathy.  Neurological: He is alert and oriented to person, place, and time.      Labs:  Estimated body mass index is 37.28 kg/(m^2) as calculated from the following:   Height as of 01/08/14: 5\' 5"  (1.651 m).   Weight as of 01/08/14: 101.606 kg (224 lb).   Imaging Review Plain radiographs demonstrate  severe degenerative joint disease of the right knee(s). The overall alignment is neutral. The bone quality appears to be good for age and reported activity level.  Retained tibial saple  Assessment/Plan:  End stage arthritis, right knee   The patient history, physical examination, clinical judgment of the provider and imaging studies are consistent with end stage degenerative joint disease of the right knee(s) and total knee arthroplasty is deemed medically necessary. The treatment options including medical management, injection therapy arthroscopy and arthroplasty were discussed at length. The risks and benefits of total knee arthroplasty were presented and reviewed. The risks due to aseptic loosening, infection, stiffness, patella tracking problems, thromboembolic complications and other imponderables were discussed. The patient acknowledged the explanation, agreed to proceed with the plan  and consent was signed. Patient is being admitted for inpatient treatment for surgery, pain control, PT, OT, prophylactic antibiotics, VTE prophylaxis, progressive ambulation and ADL's and discharge planning. The patient is planning to be discharged home with home health services.      [place] Pugh Olive Motyka   PA-C  03/06/2014, 9:51 PM

## 2014-03-09 ENCOUNTER — Other Ambulatory Visit: Payer: Self-pay | Admitting: Neurology

## 2014-03-09 MED ORDER — ARMODAFINIL 50 MG PO TABS: ORAL_TABLET | ORAL | Status: DC

## 2014-03-09 NOTE — Telephone Encounter (Signed)
Patient stated Pharmacy requested refill for Rx NUVIGIL 50 MG tablet twice, please forward to [facility].  Please call and advise.

## 2014-03-10 NOTE — Telephone Encounter (Signed)
Rx signed and faxed.

## 2014-03-12 NOTE — Patient Instructions (Addendum)
Paul Baker  03/12/2014   Your procedure is scheduled on: 03/23/14   Report to [facility] and follow signs to               Paul Baker at 9:30 AM.   Call this number if you have problems the morning of surgery [phone]   Remember:  Do not eat food or drink liquids :After Midnight.    Take these medicines the morning of surgery with A SIP OF WATER: AMLODIPINE / METOPROLOL / OMEPRAZOLE / LEVOTHYROXINE / ISOSORBIDE  / MAY TAKE HYDROCODONE IF NEEDED             BRING C PAP MASK AND TUBING TO HOSPITAL                               You may not have any metal on your body including hair pins and              piercings  Do not wear jewelry, make-up, lotions, powders or perfumes.             Do not wear nail polish.  Do not shave  48 hours prior to surgery.              Men may shave face and neck.   Do not bring valuables to the hospital. Paul Baker.  Contacts, dentures or bridgework may not be worn into surgery.  Leave suitcase in the car. After surgery it may be brought to your room.     Patients discharged the day of surgery will not be allowed to drive home.  Name and phone number of your driver:  Special Instructions: N/A              Please read over the following fact sheets you were given: _____________________________________________________________________                                                     Paul Baker  Before surgery, you can play an important role.  Because skin is not sterile, your skin needs to be as free of germs as possible.  You can reduce the number of germs on your skin by washing with CHG (chlorahexidine gluconate) soap before surgery.  CHG is an antiseptic cleaner which kills germs and bonds with the skin to continue killing germs even after washing. Please DO NOT use if you have an allergy to CHG or antibacterial soaps.   If your skin becomes reddened/irritated stop using the CHG and inform your nurse when you arrive at Short Stay. Do not shave (including legs and underarms) for at least 48 hours prior to the first CHG shower.  You may shave your face. Please follow these instructions carefully:   1.  Shower with CHG Soap the night before surgery and the  morning of Surgery.   2.  If you choose to wash your hair, wash your hair first as usual with your  normal  Shampoo.   3.  After you shampoo, rinse your hair and  body thoroughly to remove the  shampoo.                                         4.  Use CHG as you would any other liquid soap.  You can apply chg directly  to the skin and wash . Gently wash with scrungie or clean wascloth    5.  Apply the CHG Soap to your body ONLY FROM THE NECK DOWN.   Do not use on open                           Wound or open sores. Avoid contact with eyes, ears mouth and genitals (private parts).                        Genitals (private parts) with your normal soap.              6.  Wash thoroughly, paying special attention to the area where your surgery  will be performed.   7.  Thoroughly rinse your body with warm water from the neck down.   8.  DO NOT shower/wash with your normal soap after using and rinsing off  the CHG Soap .                9.  Pat yourself dry with a clean towel.             10.  Wear clean pajamas.             11.  Place clean sheets on your bed the night of your first shower and do not  sleep with pets.  Day of Surgery : Do not apply any lotions/deodorants the morning of surgery.  Please wear clean clothes to the hospital/surgery center.  FAILURE TO FOLLOW THESE INSTRUCTIONS MAY RESULT IN THE CANCELLATION OF YOUR SURGERY    PATIENT SIGNATURE_________________________________  ______________________________________________________________________     Paul Baker  An incentive spirometer is a tool that can help keep your lungs  clear and active. This tool measures how well you are filling your lungs with each breath. Taking long deep breaths may help reverse or decrease the chance of developing breathing (pulmonary) problems (especially infection) following:  A long period of time when you are unable to move or be active. BEFORE THE PROCEDURE   If the spirometer includes an indicator to show your best effort, your nurse or respiratory therapist will set it to a desired goal.  If possible, sit up straight or lean slightly forward. Try not to slouch.  Hold the incentive spirometer in an upright position. INSTRUCTIONS FOR USE   Sit on the edge of your bed if possible, or sit up as far as you can in bed or on a chair.  Hold the incentive spirometer in an upright position.  Breathe out normally.  Place the mouthpiece in your mouth and seal your lips tightly around it.  Breathe in slowly and as deeply as possible, raising the piston or the ball toward the top of the column.  Hold your breath for 3-5 seconds or for as long as possible. Allow the piston or ball to fall to the bottom of the column.  Remove the mouthpiece from your mouth and breathe out normally.  Rest for a few seconds and repeat Steps 1  through 7 at least 10 times every 1-2 hours when you are awake. Take your time and take a few normal breaths between deep breaths.  The spirometer may include an indicator to show your best effort. Use the indicator as a goal to work toward during each repetition.  After each set of 10 deep breaths, practice coughing to be sure your lungs are clear. If you have an incision (the cut made at the time of surgery), support your incision when coughing by placing a pillow or rolled up towels firmly against it. Once you are able to get out of bed, walk around indoors and cough well. You may stop using the incentive spirometer when instructed by your caregiver.  RISKS AND COMPLICATIONS  Take your time so you do not get  dizzy or light-headed.  If you are in pain, you may need to take or ask for pain medication before doing incentive spirometry. It is harder to take a deep breath if you are having pain. AFTER USE  Rest and breathe slowly and easily.  It can be helpful to keep track of a log of your progress. Your caregiver can provide you with a simple table to help with this. If you are using the spirometer at home, follow these instructions: [place] IF:   You are having difficultly using the spirometer.  You have trouble using the spirometer as often as instructed.  Your pain medication is not giving enough relief while using the spirometer.  You develop fever of 100.5 F (38.1 C) or higher. SEEK IMMEDIATE MEDICAL CARE IF:   You cough up bloody sputum that had not been present before.  You develop fever of 102 F (38.9 C) or greater.  You develop worsening pain at or near the incision site. MAKE SURE YOU:   Understand these instructions.  Will watch your condition.  Will get help right away if you are not doing well or get worse. Document Released: 05/01/2006 Document Revised: 03/13/2011 Document Reviewed: 07/02/2006 Exit[employer] Patient Information 2014 [employer], [place].   ________________________________________________________________________  WHAT IS A BLOOD TRANSFUSION? Blood Transfusion Information  A transfusion is the replacement of blood or some of its parts. Blood is made up of multiple cells which provide different functions.  Red blood cells carry oxygen and are used for blood loss replacement.  White blood cells fight against infection.  Platelets control bleeding.  Plasma helps clot blood.  Other blood products are available for specialized needs, such as hemophilia or other clotting disorders. BEFORE THE TRANSFUSION  Who gives blood for transfusions?   Healthy volunteers who are fully evaluated to make sure their blood is safe. This is blood bank  blood. Transfusion therapy is the safest it has ever been in the practice of medicine. Before blood is taken from a donor, a complete history is taken to make sure that person has no history of diseases nor engages in risky social behavior (examples are intravenous drug use or sexual activity with multiple partners). The donor's travel history is screened to minimize risk of transmitting infections, such as malaria. The donated blood is tested for signs of infectious diseases, such as HIV and hepatitis. The blood is then tested to be sure it is compatible with you in order to minimize the chance of a transfusion reaction. If you or a relative donates blood, this is often done in anticipation of surgery and is not appropriate for emergency situations. It takes many days to process the donated blood. RISKS AND  COMPLICATIONS Although transfusion therapy is very safe and saves many lives, the main dangers of transfusion include:   Getting an infectious disease.  Developing a transfusion reaction. This is an allergic reaction to something in the blood you were given. Every precaution is taken to prevent this. The decision to have a blood transfusion has been considered carefully by your caregiver before blood is given. Blood is not given unless the benefits outweigh the risks. AFTER THE TRANSFUSION  Right after receiving a blood transfusion, you will usually feel much better and more energetic. This is especially true if your red blood cells have gotten low (anemic). The transfusion raises the level of the red blood cells which carry oxygen, and this usually causes an energy increase.  The nurse administering the transfusion will monitor you carefully for complications. HOME CARE INSTRUCTIONS  No special instructions are needed after a transfusion. You may find your energy is better. Speak with your caregiver about any limitations on activity for underlying diseases you may have. SEEK MEDICAL CARE IF:    Your condition is not improving after your transfusion.  You develop redness or irritation at the intravenous (IV) site. SEEK IMMEDIATE MEDICAL CARE IF:  Any of the following symptoms occur over the next 12 hours:  Shaking chills.  You have a temperature by mouth above 102 F (38.9 C), not controlled by medicine.  Chest, back, or muscle pain.  People around you feel you are not acting correctly or are confused.  Shortness of breath or difficulty breathing.  Dizziness and fainting.  You get a rash or develop hives.  You have a decrease in urine output.  Your urine turns a dark color or changes to pink, red, or brown. Any of the following symptoms occur over the next 10 days:  You have a temperature by mouth above 102 F (38.9 C), not controlled by medicine.  Shortness of breath.  Weakness after normal activity.  The white part of the eye turns yellow (jaundice).  You have a decrease in the amount of urine or are urinating less often.  Your urine turns a dark color or changes to pink, red, or brown. Document Released: 12/17/1999 Document Revised: 03/13/2011 Document Reviewed: 08/05/2007 [facility] Patient Information 2014 [place], [place].  _______________________________________________________________________

## 2014-03-13 ENCOUNTER — Encounter (HOSPITAL_COMMUNITY)
Admission: RE | Admit: 2014-03-13 | Discharge: 2014-03-13 | Disposition: A | Discharge status: Home / Self Care | Payer: Medicare Other | Source: Ambulatory Visit | Attending: Orthopedic Surgery | Admitting: Orthopedic Surgery

## 2014-03-13 ENCOUNTER — Encounter (HOSPITAL_COMMUNITY): Payer: Self-pay

## 2014-03-13 DIAGNOSIS — M1711 Unilateral primary osteoarthritis, right knee: Secondary | ICD-10-CM | POA: Diagnosis not present

## 2014-03-13 DIAGNOSIS — Z01812 Encounter for preprocedural laboratory examination: Secondary | ICD-10-CM | POA: Diagnosis not present

## 2014-03-13 DIAGNOSIS — E291 Testicular hypofunction: Secondary | ICD-10-CM | POA: Diagnosis not present

## 2014-03-13 DIAGNOSIS — Z7901 Long term (current) use of anticoagulants: Secondary | ICD-10-CM | POA: Diagnosis not present

## 2014-03-13 HISTORY — DX: Personal history of other medical treatment: Z92.89

## 2014-03-13 HISTORY — DX: Headache: R51

## 2014-03-13 HISTORY — DX: Hypothyroidism, unspecified: E03.9

## 2014-03-13 HISTORY — DX: Psoriasis, unspecified: L40.9

## 2014-03-13 HISTORY — DX: Headache, unspecified: R51.9

## 2014-03-13 LAB — CBC
HCT: 35.7 % — ABNORMAL LOW (ref 39.0–52.0)
Hemoglobin: 11.2 g/dL — ABNORMAL LOW (ref 13.0–17.0)
MCH: 29.4 pg (ref 26.0–34.0)
MCHC: 31.4 g/dL (ref 30.0–36.0)
MCV: 93.7 fL (ref 78.0–100.0)
PLATELETS: 224 10*3/uL (ref 150–400)
RBC: 3.81 MIL/uL — ABNORMAL LOW (ref 4.22–5.81)
RDW: 14.2 % (ref 11.5–15.5)
WBC: 5.8 10*3/uL (ref 4.0–10.5)

## 2014-03-13 LAB — BASIC METABOLIC PANEL
Anion gap: 10 (ref 5–15)
BUN: 28 mg/dL — ABNORMAL HIGH (ref 6–23)
CALCIUM: 9.3 mg/dL (ref 8.4–10.5)
CO2: 23 mmol/L (ref 19–32)
Chloride: 106 mmol/L (ref 96–112)
Creatinine, Ser: 1.25 mg/dL (ref 0.50–1.35)
GFR, EST AFRICAN AMERICAN: 70 mL/min — AB (ref >90)
GFR, EST NON AFRICAN AMERICAN: 60 mL/min — AB (ref >90)
GLUCOSE: 272 mg/dL — AB (ref 70–99)
Potassium: 4.6 mmol/L (ref 3.5–5.1)
SODIUM: 139 mmol/L (ref 135–145)

## 2014-03-13 LAB — URINE MICROSCOPIC-ADD ON

## 2014-03-13 LAB — URINALYSIS, ROUTINE W REFLEX MICROSCOPIC
Bilirubin Urine: NEGATIVE
Hgb urine dipstick: NEGATIVE
Ketones, ur: NEGATIVE mg/dL
LEUKOCYTES UA: NEGATIVE
Nitrite: NEGATIVE
PH: 6 (ref 5.0–8.0)
Protein, ur: NEGATIVE mg/dL
Specific Gravity, Urine: 1.023 (ref 1.005–1.030)
Urobilinogen, UA: 0.2 mg/dL (ref 0.0–1.0)

## 2014-03-13 LAB — PROTIME-INR
INR: 0.95 (ref 0.00–1.49)
Prothrombin Time: 12.8 seconds (ref 11.6–15.2)

## 2014-03-13 LAB — APTT: aPTT: 26 seconds (ref 24–37)

## 2014-03-13 LAB — SURGICAL PCR SCREEN
MRSA, PCR: NEGATIVE
Staphylococcus aureus: NEGATIVE

## 2014-03-13 NOTE — Progress Notes (Signed)
Cardiac clearance in EPIC from Dr. Domenic Polite

## 2014-03-13 NOTE — Progress Notes (Signed)
BMET / UA faxed to Dr. Alvan Dame

## 2014-03-17 DIAGNOSIS — E291 Testicular hypofunction: Secondary | ICD-10-CM | POA: Diagnosis not present

## 2014-03-23 ENCOUNTER — Inpatient Hospital Stay (HOSPITAL_COMMUNITY): Payer: Medicare Other | Admitting: Anesthesiology

## 2014-03-23 ENCOUNTER — Encounter (HOSPITAL_COMMUNITY)
Admission: RE | Disposition: A | Discharge status: Home Health | Payer: Self-pay | Source: Ambulatory Visit | Attending: Orthopedic Surgery

## 2014-03-23 ENCOUNTER — Encounter (HOSPITAL_COMMUNITY): Payer: Self-pay | Admitting: *Deleted

## 2014-03-23 ENCOUNTER — Inpatient Hospital Stay (HOSPITAL_COMMUNITY)
Admission: RE | Admit: 2014-03-23 | Discharge: 2014-03-24 | DRG: 470 | Disposition: A | Discharge status: Home Health | Payer: Medicare Other | Source: Ambulatory Visit | Attending: Orthopedic Surgery | Admitting: Orthopedic Surgery

## 2014-03-23 DIAGNOSIS — G2581 Restless legs syndrome: Secondary | ICD-10-CM | POA: Diagnosis present

## 2014-03-23 DIAGNOSIS — E1140 Type 2 diabetes mellitus with diabetic neuropathy, unspecified: Secondary | ICD-10-CM | POA: Diagnosis not present

## 2014-03-23 DIAGNOSIS — Z87891 Personal history of nicotine dependence: Secondary | ICD-10-CM

## 2014-03-23 DIAGNOSIS — Z96652 Presence of left artificial knee joint: Secondary | ICD-10-CM | POA: Diagnosis present

## 2014-03-23 DIAGNOSIS — T8484XA Pain due to internal orthopedic prosthetic devices, implants and grafts, initial encounter: Secondary | ICD-10-CM | POA: Diagnosis not present

## 2014-03-23 DIAGNOSIS — Z888 Allergy status to other drugs, medicaments and biological substances status: Secondary | ICD-10-CM | POA: Diagnosis not present

## 2014-03-23 DIAGNOSIS — Z833 Family history of diabetes mellitus: Secondary | ICD-10-CM

## 2014-03-23 DIAGNOSIS — Z86010 Personal history of colonic polyps: Secondary | ICD-10-CM

## 2014-03-23 DIAGNOSIS — Z01812 Encounter for preprocedural laboratory examination: Secondary | ICD-10-CM

## 2014-03-23 DIAGNOSIS — Z885 Allergy status to narcotic agent status: Secondary | ICD-10-CM

## 2014-03-23 DIAGNOSIS — J45909 Unspecified asthma, uncomplicated: Secondary | ICD-10-CM | POA: Diagnosis present

## 2014-03-23 DIAGNOSIS — G47419 Narcolepsy without cataplexy: Secondary | ICD-10-CM | POA: Diagnosis present

## 2014-03-23 DIAGNOSIS — Z6836 Body mass index (BMI) 36.0-36.9, adult: Secondary | ICD-10-CM | POA: Diagnosis not present

## 2014-03-23 DIAGNOSIS — E1165 Type 2 diabetes mellitus with hyperglycemia: Secondary | ICD-10-CM | POA: Diagnosis not present

## 2014-03-23 DIAGNOSIS — M1711 Unilateral primary osteoarthritis, right knee: Principal | ICD-10-CM | POA: Diagnosis present

## 2014-03-23 DIAGNOSIS — I10 Essential (primary) hypertension: Secondary | ICD-10-CM | POA: Diagnosis not present

## 2014-03-23 DIAGNOSIS — Z96659 Presence of unspecified artificial knee joint: Secondary | ICD-10-CM

## 2014-03-23 DIAGNOSIS — Z9109 Other allergy status, other than to drugs and biological substances: Secondary | ICD-10-CM | POA: Diagnosis not present

## 2014-03-23 DIAGNOSIS — G4733 Obstructive sleep apnea (adult) (pediatric): Secondary | ICD-10-CM | POA: Diagnosis present

## 2014-03-23 DIAGNOSIS — Z8249 Family history of ischemic heart disease and other diseases of the circulatory system: Secondary | ICD-10-CM | POA: Diagnosis not present

## 2014-03-23 DIAGNOSIS — Z6837 Body mass index (BMI) 37.0-37.9, adult: Secondary | ICD-10-CM | POA: Diagnosis not present

## 2014-03-23 DIAGNOSIS — I2510 Atherosclerotic heart disease of native coronary artery without angina pectoris: Secondary | ICD-10-CM | POA: Diagnosis not present

## 2014-03-23 DIAGNOSIS — E782 Mixed hyperlipidemia: Secondary | ICD-10-CM | POA: Diagnosis present

## 2014-03-23 DIAGNOSIS — Z86711 Personal history of pulmonary embolism: Secondary | ICD-10-CM

## 2014-03-23 DIAGNOSIS — Z96651 Presence of right artificial knee joint: Secondary | ICD-10-CM

## 2014-03-23 DIAGNOSIS — M25561 Pain in right knee: Secondary | ICD-10-CM | POA: Diagnosis not present

## 2014-03-23 DIAGNOSIS — M179 Osteoarthritis of knee, unspecified: Secondary | ICD-10-CM | POA: Diagnosis not present

## 2014-03-23 DIAGNOSIS — Z472 Encounter for removal of internal fixation device: Secondary | ICD-10-CM | POA: Diagnosis not present

## 2014-03-23 DIAGNOSIS — Z86718 Personal history of other venous thrombosis and embolism: Secondary | ICD-10-CM | POA: Diagnosis not present

## 2014-03-23 HISTORY — PX: TOTAL KNEE ARTHROPLASTY: SHX125

## 2014-03-23 LAB — TYPE AND SCREEN
ABO/RH(D): A POS
ANTIBODY SCREEN: NEGATIVE

## 2014-03-23 LAB — GLUCOSE, CAPILLARY
GLUCOSE-CAPILLARY: 204 mg/dL — AB (ref 70–99)
GLUCOSE-CAPILLARY: 222 mg/dL — AB (ref 70–99)
Glucose-Capillary: 314 mg/dL — ABNORMAL HIGH (ref 70–99)
Glucose-Capillary: 330 mg/dL — ABNORMAL HIGH (ref 70–99)
Glucose-Capillary: 237 mg/dL — ABNORMAL HIGH (ref 70–99)
Glucose-Capillary: 154 mg/dL — ABNORMAL HIGH (ref 70–99)

## 2014-03-23 LAB — ABO/RH: ABO/RH(D): A POS

## 2014-03-23 SURGERY — ARTHROPLASTY, KNEE, TOTAL
Anesthesia: Spinal | Site: Knee | Laterality: Right

## 2014-03-23 MED ORDER — CEFAZOLIN SODIUM-DEXTROSE 2-3 GM-% IV SOLR
INTRAVENOUS | Status: AC
  Filled 2014-03-23: qty 50

## 2014-03-23 MED ORDER — ONDANSETRON HCL 4 MG/2ML IJ SOLN
4.0000 mg | Freq: Four times a day (QID) | INTRAMUSCULAR | Status: DC | PRN
  Administered 2014-03-24: 4 mg via INTRAVENOUS
  Filled 2014-03-23: qty 2

## 2014-03-23 MED ORDER — TRANEXAMIC ACID 100 MG/ML IV SOLN
2000.0000 mg | Freq: Once | INTRAVENOUS | Status: DC
  Filled 2014-03-23: qty 20

## 2014-03-23 MED ORDER — PHENOL 1.4 % MT LIQD
1.0000 | OROMUCOSAL | Status: DC | PRN
  Filled 2014-03-23: qty 177

## 2014-03-23 MED ORDER — SODIUM CHLORIDE 0.9 % IR SOLN
Status: DC | PRN
  Administered 2014-03-23: 1000 mL

## 2014-03-23 MED ORDER — LACTATED RINGERS IV SOLN
INTRAVENOUS | Status: DC
  Administered 2014-03-23 (×2): via INTRAVENOUS
  Administered 2014-03-23: 1000 mL via INTRAVENOUS

## 2014-03-23 MED ORDER — HYDROCODONE-ACETAMINOPHEN 10-325 MG PO TABS
1.0000 | ORAL_TABLET | ORAL | Status: DC
  Administered 2014-03-23 – 2014-03-24 (×6): 2 via ORAL
  Filled 2014-03-23 (×6): qty 2

## 2014-03-23 MED ORDER — ALBUTEROL SULFATE HFA 108 (90 BASE) MCG/ACT IN AERS: 1.0000 | INHALATION_SPRAY | RESPIRATORY_TRACT | Status: DC | PRN

## 2014-03-23 MED ORDER — ALUM & MAG HYDROXIDE-SIMETH 200-200-20 MG/5ML PO SUSP: 30.0000 mL | ORAL | Status: DC | PRN

## 2014-03-23 MED ORDER — CHLORHEXIDINE GLUCONATE 4 % EX LIQD: 60.0000 mL | Freq: Once | CUTANEOUS | Status: DC

## 2014-03-23 MED ORDER — BUPIVACAINE-EPINEPHRINE (PF) 0.25% -1:200000 IJ SOLN
INTRAMUSCULAR | Status: DC | PRN
  Administered 2014-03-23: 30 mL

## 2014-03-23 MED ORDER — LIDOCAINE HCL (CARDIAC) 20 MG/ML IV SOLN
INTRAVENOUS | Status: DC | PRN
  Administered 2014-03-23: 50 mg via INTRAVENOUS

## 2014-03-23 MED ORDER — PROPOFOL 10 MG/ML IV BOLUS
INTRAVENOUS | Status: AC
  Filled 2014-03-23: qty 20

## 2014-03-23 MED ORDER — INSULIN ASPART 100 UNIT/ML ~~LOC~~ SOLN
10.0000 [IU] | Freq: Once | SUBCUTANEOUS | Status: AC
  Administered 2014-03-23: 10 [IU] via SUBCUTANEOUS

## 2014-03-23 MED ORDER — ALBUTEROL SULFATE (2.5 MG/3ML) 0.083% IN NEBU: 2.5000 mg | INHALATION_SOLUTION | RESPIRATORY_TRACT | Status: DC | PRN

## 2014-03-23 MED ORDER — OMEPRAZOLE 20 MG PO CPDR
20.0000 mg | DELAYED_RELEASE_CAPSULE | Freq: Two times a day (BID) | ORAL | Status: DC
  Administered 2014-03-23 – 2014-03-24 (×2): 20 mg via ORAL
  Filled 2014-03-23 (×5): qty 1

## 2014-03-23 MED ORDER — BUPIVACAINE IN DEXTROSE 0.75-8.25 % IT SOLN
INTRATHECAL | Status: DC | PRN
  Administered 2014-03-23: 1.6 mL via INTRATHECAL

## 2014-03-23 MED ORDER — FENOFIBRATE 160 MG PO TABS
160.0000 mg | ORAL_TABLET | Freq: Every day | ORAL | Status: DC
  Administered 2014-03-23: 160 mg via ORAL
  Filled 2014-03-23 (×2): qty 1

## 2014-03-23 MED ORDER — AMLODIPINE BESYLATE 5 MG PO TABS
5.0000 mg | ORAL_TABLET | Freq: Every day | ORAL | Status: DC
  Administered 2014-03-24: 5 mg via ORAL
  Filled 2014-03-23: qty 1

## 2014-03-23 MED ORDER — LEVOTHYROXINE SODIUM 88 MCG PO TABS
88.0000 ug | ORAL_TABLET | Freq: Every day | ORAL | Status: DC
  Administered 2014-03-24: 88 ug via ORAL
  Filled 2014-03-23 (×2): qty 1

## 2014-03-23 MED ORDER — INSULIN ASPART 100 UNIT/ML ~~LOC~~ SOLN
10.0000 [IU] | Freq: Once | SUBCUTANEOUS | Status: AC
  Administered 2014-03-23: 10 [IU] via SUBCUTANEOUS
  Filled 2014-03-23: qty 1

## 2014-03-23 MED ORDER — INSULIN DETEMIR 100 UNIT/ML ~~LOC~~ SOLN
50.0000 [IU] | Freq: Two times a day (BID) | SUBCUTANEOUS | Status: DC
  Administered 2014-03-23 – 2014-03-24 (×2): 50 [IU] via SUBCUTANEOUS
  Filled 2014-03-23 (×3): qty 0.5

## 2014-03-23 MED ORDER — PHENYLEPHRINE 40 MCG/ML (10ML) SYRINGE FOR IV PUSH (FOR BLOOD PRESSURE SUPPORT)
PREFILLED_SYRINGE | INTRAVENOUS | Status: AC
  Filled 2014-03-23: qty 10

## 2014-03-23 MED ORDER — SODIUM CHLORIDE 0.9 % IJ SOLN
INTRAMUSCULAR | Status: DC | PRN
  Administered 2014-03-23: 30 mL

## 2014-03-23 MED ORDER — FERROUS SULFATE 325 (65 FE) MG PO TABS
325.0000 mg | ORAL_TABLET | Freq: Three times a day (TID) | ORAL | Status: DC
  Administered 2014-03-23 – 2014-03-24 (×2): 325 mg via ORAL
  Filled 2014-03-23 (×5): qty 1

## 2014-03-23 MED ORDER — METFORMIN HCL 500 MG PO TABS: 500.0000 mg | ORAL_TABLET | Freq: Every day | ORAL | Status: DC

## 2014-03-23 MED ORDER — CEFAZOLIN SODIUM-DEXTROSE 2-3 GM-% IV SOLR
2.0000 g | Freq: Four times a day (QID) | INTRAVENOUS | Status: AC
  Administered 2014-03-23 – 2014-03-24 (×2): 2 g via INTRAVENOUS
  Filled 2014-03-23 (×2): qty 50

## 2014-03-23 MED ORDER — ARMODAFINIL 50 MG PO TABS: 150.0000 mg | ORAL_TABLET | Freq: Every day | ORAL | Status: DC

## 2014-03-23 MED ORDER — SODIUM CHLORIDE 0.9 % IJ SOLN
INTRAMUSCULAR | Status: AC
  Filled 2014-03-23: qty 50

## 2014-03-23 MED ORDER — ROPINIROLE HCL 1 MG PO TABS
4.0000 mg | ORAL_TABLET | Freq: Every day | ORAL | Status: DC
  Administered 2014-03-23: 4 mg via ORAL
  Filled 2014-03-23 (×2): qty 4

## 2014-03-23 MED ORDER — RIVAROXABAN 10 MG PO TABS
10.0000 mg | ORAL_TABLET | ORAL | Status: DC
  Administered 2014-03-24: 10 mg via ORAL
  Filled 2014-03-23 (×2): qty 1

## 2014-03-23 MED ORDER — ARMODAFINIL 50 MG PO TABS: 150.0000 mg | ORAL_TABLET | ORAL | Status: DC

## 2014-03-23 MED ORDER — METOCLOPRAMIDE HCL 5 MG/ML IJ SOLN: 5.0000 mg | Freq: Three times a day (TID) | INTRAMUSCULAR | Status: DC | PRN

## 2014-03-23 MED ORDER — ONDANSETRON HCL 4 MG/2ML IJ SOLN
INTRAMUSCULAR | Status: AC
  Filled 2014-03-23: qty 2

## 2014-03-23 MED ORDER — POLYVINYL ALCOHOL 1.4 % OP SOLN: 1.0000 [drp] | Freq: Every day | OPHTHALMIC | Status: DC | PRN

## 2014-03-23 MED ORDER — LORATADINE 10 MG PO TABS
10.0000 mg | ORAL_TABLET | Freq: Every evening | ORAL | Status: DC | PRN
  Filled 2014-03-23: qty 1

## 2014-03-23 MED ORDER — METHOCARBAMOL 1000 MG/10ML IJ SOLN
500.0000 mg | Freq: Four times a day (QID) | INTRAVENOUS | Status: DC | PRN
  Administered 2014-03-23: 500 mg via INTRAVENOUS
  Filled 2014-03-23 (×2): qty 5

## 2014-03-23 MED ORDER — INSULIN ASPART 100 UNIT/ML ~~LOC~~ SOLN
SUBCUTANEOUS | Status: AC
  Filled 2014-03-23: qty 1

## 2014-03-23 MED ORDER — FENTANYL CITRATE 0.05 MG/ML IJ SOLN
INTRAMUSCULAR | Status: AC
Start: 2014-03-23 — End: 2014-03-23
  Filled 2014-03-23: qty 2

## 2014-03-23 MED ORDER — CEFAZOLIN SODIUM-DEXTROSE 2-3 GM-% IV SOLR
2.0000 g | INTRAVENOUS | Status: AC
  Administered 2014-03-23: 2 g via INTRAVENOUS

## 2014-03-23 MED ORDER — HYDROMORPHONE HCL 1 MG/ML IJ SOLN: 0.2500 mg | INTRAMUSCULAR | Status: DC | PRN

## 2014-03-23 MED ORDER — MIDAZOLAM HCL 5 MG/5ML IJ SOLN
INTRAMUSCULAR | Status: DC | PRN
  Administered 2014-03-23: 2 mg via INTRAVENOUS

## 2014-03-23 MED ORDER — LOSARTAN POTASSIUM 50 MG PO TABS
100.0000 mg | ORAL_TABLET | Freq: Every day | ORAL | Status: DC
  Administered 2014-03-24: 100 mg via ORAL
  Filled 2014-03-23 (×2): qty 2

## 2014-03-23 MED ORDER — BUPIVACAINE-EPINEPHRINE (PF) 0.25% -1:200000 IJ SOLN
INTRAMUSCULAR | Status: AC
  Filled 2014-03-23: qty 30

## 2014-03-23 MED ORDER — DIPHENHYDRAMINE HCL 25 MG PO CAPS: 25.0000 mg | ORAL_CAPSULE | Freq: Four times a day (QID) | ORAL | Status: DC | PRN

## 2014-03-23 MED ORDER — SALINE SPRAY 0.65 % NA SOLN: 1.0000 | Freq: Every day | NASAL | Status: DC | PRN

## 2014-03-23 MED ORDER — ONDANSETRON HCL 4 MG PO TABS: 4.0000 mg | ORAL_TABLET | Freq: Four times a day (QID) | ORAL | Status: DC | PRN

## 2014-03-23 MED ORDER — DOCUSATE SODIUM 100 MG PO CAPS
100.0000 mg | ORAL_CAPSULE | Freq: Two times a day (BID) | ORAL | Status: DC
  Administered 2014-03-23 – 2014-03-24 (×2): 100 mg via ORAL

## 2014-03-23 MED ORDER — CLOBETASOL PROPIONATE 0.05 % EX SOLN: 1.0000 "application " | Freq: Every day | CUTANEOUS | Status: DC | PRN

## 2014-03-23 MED ORDER — METOCLOPRAMIDE HCL 10 MG PO TABS: 5.0000 mg | ORAL_TABLET | Freq: Three times a day (TID) | ORAL | Status: DC | PRN

## 2014-03-23 MED ORDER — HYDROMORPHONE HCL 1 MG/ML IJ SOLN
0.5000 mg | INTRAMUSCULAR | Status: DC | PRN
  Administered 2014-03-23 (×2): 1 mg via INTRAVENOUS
  Filled 2014-03-23 (×2): qty 1

## 2014-03-23 MED ORDER — MAGNESIUM CITRATE PO SOLN: 1.0000 | Freq: Once | ORAL | Status: AC | PRN

## 2014-03-23 MED ORDER — DEXAMETHASONE SODIUM PHOSPHATE 10 MG/ML IJ SOLN: 10.0000 mg | Freq: Once | INTRAMUSCULAR | Status: DC

## 2014-03-23 MED ORDER — LOSARTAN POTASSIUM 50 MG PO TABS: 100.0000 mg | ORAL_TABLET | Freq: Every day | ORAL | Status: DC

## 2014-03-23 MED ORDER — METOPROLOL SUCCINATE ER 50 MG PO TB24
50.0000 mg | ORAL_TABLET | Freq: Two times a day (BID) | ORAL | Status: DC
  Administered 2014-03-23 – 2014-03-24 (×2): 50 mg via ORAL
  Filled 2014-03-23 (×3): qty 1

## 2014-03-23 MED ORDER — NITROGLYCERIN 0.4 MG SL SUBL: 0.4000 mg | SUBLINGUAL_TABLET | SUBLINGUAL | Status: DC | PRN

## 2014-03-23 MED ORDER — LIDOCAINE HCL (CARDIAC) 20 MG/ML IV SOLN
INTRAVENOUS | Status: AC
Start: 2014-03-23 — End: 2014-03-23
  Filled 2014-03-23: qty 5

## 2014-03-23 MED ORDER — BISACODYL 10 MG RE SUPP: 10.0000 mg | Freq: Every day | RECTAL | Status: DC | PRN

## 2014-03-23 MED ORDER — KETOROLAC TROMETHAMINE 30 MG/ML IJ SOLN
INTRAMUSCULAR | Status: AC
  Filled 2014-03-23: qty 1

## 2014-03-23 MED ORDER — CLONAZEPAM 1 MG PO TABS
1.0000 mg | ORAL_TABLET | Freq: Every day | ORAL | Status: DC
Start: 2014-03-23 — End: 2014-03-24
  Administered 2014-03-23: 1 mg via ORAL
  Filled 2014-03-23: qty 1

## 2014-03-23 MED ORDER — FENTANYL CITRATE 0.05 MG/ML IJ SOLN
INTRAMUSCULAR | Status: DC | PRN
  Administered 2014-03-23: 100 ug via INTRAVENOUS

## 2014-03-23 MED ORDER — METFORMIN HCL 500 MG PO TABS: 1000.0000 mg | ORAL_TABLET | Freq: Every day | ORAL | Status: DC

## 2014-03-23 MED ORDER — KETOROLAC TROMETHAMINE 30 MG/ML IJ SOLN
INTRAMUSCULAR | Status: DC | PRN
  Administered 2014-03-23: 30 mg

## 2014-03-23 MED ORDER — INSULIN ASPART 100 UNIT/ML ~~LOC~~ SOLN
0.0000 [IU] | Freq: Three times a day (TID) | SUBCUTANEOUS | Status: DC
  Administered 2014-03-23: 4 [IU] via SUBCUTANEOUS
  Administered 2014-03-24: 11 [IU] via SUBCUTANEOUS
  Administered 2014-03-24: 7 [IU] via SUBCUTANEOUS

## 2014-03-23 MED ORDER — 0.9 % SODIUM CHLORIDE (POUR BTL) OPTIME
TOPICAL | Status: DC | PRN
  Administered 2014-03-23: 1000 mL

## 2014-03-23 MED ORDER — MIDAZOLAM HCL 2 MG/2ML IJ SOLN
INTRAMUSCULAR | Status: AC
  Filled 2014-03-23: qty 2

## 2014-03-23 MED ORDER — PROPOFOL INFUSION 10 MG/ML OPTIME
INTRAVENOUS | Status: DC | PRN
  Administered 2014-03-23: 100 ug/kg/min via INTRAVENOUS

## 2014-03-23 MED ORDER — METHOCARBAMOL 500 MG PO TABS
500.0000 mg | ORAL_TABLET | Freq: Four times a day (QID) | ORAL | Status: DC | PRN
  Administered 2014-03-24: 500 mg via ORAL
  Filled 2014-03-23: qty 1

## 2014-03-23 MED ORDER — METFORMIN HCL 500 MG PO TABS
1000.0000 mg | ORAL_TABLET | Freq: Two times a day (BID) | ORAL | Status: DC
  Administered 2014-03-23 – 2014-03-24 (×2): 1000 mg via ORAL
  Filled 2014-03-23 (×4): qty 2

## 2014-03-23 MED ORDER — CELECOXIB 200 MG PO CAPS
200.0000 mg | ORAL_CAPSULE | Freq: Two times a day (BID) | ORAL | Status: DC
  Administered 2014-03-23: 200 mg via ORAL
  Filled 2014-03-23 (×3): qty 1

## 2014-03-23 MED ORDER — ISOSORBIDE MONONITRATE ER 30 MG PO TB24
30.0000 mg | ORAL_TABLET | Freq: Two times a day (BID) | ORAL | Status: DC
  Administered 2014-03-23 – 2014-03-24 (×2): 30 mg via ORAL
  Filled 2014-03-23 (×4): qty 1

## 2014-03-23 MED ORDER — SODIUM CHLORIDE 0.9 % IV SOLN
INTRAVENOUS | Status: DC
  Administered 2014-03-23 – 2014-03-24 (×2): via INTRAVENOUS
  Filled 2014-03-23 (×9): qty 1000

## 2014-03-23 MED ORDER — NON FORMULARY: 20.0000 mg | Freq: Two times a day (BID) | Status: DC

## 2014-03-23 MED ORDER — MENTHOL 3 MG MT LOZG
1.0000 | LOZENGE | OROMUCOSAL | Status: DC | PRN
Start: 2014-03-23 — End: 2014-03-24

## 2014-03-23 MED ORDER — ROPINIROLE HCL 1 MG PO TABS: 4.0000 mg | ORAL_TABLET | Freq: Every day | ORAL | Status: DC

## 2014-03-23 MED ORDER — PHENYLEPHRINE HCL 10 MG/ML IJ SOLN
INTRAMUSCULAR | Status: DC | PRN
  Administered 2014-03-23 (×5): 80 ug via INTRAVENOUS

## 2014-03-23 MED ORDER — ROPINIROLE HCL 1 MG PO TABS
2.0000 mg | ORAL_TABLET | Freq: Every day | ORAL | Status: DC
  Administered 2014-03-23: 2 mg via ORAL
  Filled 2014-03-23 (×3): qty 2

## 2014-03-23 MED ORDER — TRIAMTERENE-HCTZ 37.5-25 MG PO TABS
1.0000 | ORAL_TABLET | Freq: Every day | ORAL | Status: DC
  Administered 2014-03-24: 1 via ORAL
  Filled 2014-03-23 (×2): qty 1

## 2014-03-23 MED ORDER — POLYETHYLENE GLYCOL 3350 17 G PO PACK
17.0000 g | PACK | Freq: Two times a day (BID) | ORAL | Status: DC
  Administered 2014-03-23: 17 g via ORAL

## 2014-03-23 MED ORDER — PRAVASTATIN SODIUM 20 MG PO TABS
20.0000 mg | ORAL_TABLET | Freq: Every day | ORAL | Status: DC
  Administered 2014-03-23: 20 mg via ORAL
  Filled 2014-03-23 (×2): qty 1

## 2014-03-23 SURGICAL SUPPLY — 57 items
BAG DECANTER FOR FLEXI CONT (MISCELLANEOUS) ×1 IMPLANT
BAG SPEC THK2 15X12 ZIP CLS (MISCELLANEOUS) ×1
BAG ZIPLOCK 12X15 (MISCELLANEOUS) ×1 IMPLANT
BANDAGE ELASTIC 6 VELCRO ST LF (GAUZE/BANDAGES/DRESSINGS) ×2 IMPLANT
BANDAGE ESMARK 6X9 LF (GAUZE/BANDAGES/DRESSINGS) ×1 IMPLANT
BLADE SAW SGTL 13.0X1.19X90.0M (BLADE) ×2 IMPLANT
BNDG CMPR 9X6 STRL LF SNTH (GAUZE/BANDAGES/DRESSINGS) ×1
BNDG ESMARK 6X9 LF (GAUZE/BANDAGES/DRESSINGS) ×2
BONE CEMENT GENTAMICIN (Cement) ×4 IMPLANT
BOWL SMART MIX CTS (DISPOSABLE) ×2 IMPLANT
CAPT KNEE TOTAL 3 ATTUNE ×1 IMPLANT
CEMENT BONE GENTAMICIN 40 (Cement) IMPLANT
CUFF TOURN SGL QUICK 34 (TOURNIQUET CUFF) ×2
CUFF TRNQT CYL 34X4X40X1 (TOURNIQUET CUFF) ×1 IMPLANT
DECANTER SPIKE VIAL GLASS SM (MISCELLANEOUS) ×2 IMPLANT
DRAPE EXTREMITY T 121X128X90 (DRAPE) ×2 IMPLANT
DRAPE POUCH INSTRU U-SHP 10X18 (DRAPES) ×2 IMPLANT
DRAPE U-SHAPE 47X51 STRL (DRAPES) ×2 IMPLANT
DRSG AQUACEL AG ADV 3.5X10 (GAUZE/BANDAGES/DRESSINGS) ×2 IMPLANT
DRSG AQUACEL AG ADV 3.5X14 (GAUZE/BANDAGES/DRESSINGS) ×1 IMPLANT
DURAPREP 26ML APPLICATOR (WOUND CARE) ×4 IMPLANT
ELECT REM PT RETURN 9FT ADLT (ELECTROSURGICAL) ×2
ELECTRODE REM PT RTRN 9FT ADLT (ELECTROSURGICAL) ×1 IMPLANT
FACESHIELD WRAPAROUND (MASK) ×10 IMPLANT
FACESHIELD WRAPAROUND OR TEAM (MASK) ×5 IMPLANT
GLOVE BIOGEL PI IND STRL 7.5 (GLOVE) ×1 IMPLANT
GLOVE BIOGEL PI IND STRL 8.5 (GLOVE) ×1 IMPLANT
GLOVE BIOGEL PI INDICATOR 7.5 (GLOVE) ×1
GLOVE BIOGEL PI INDICATOR 8.5 (GLOVE) ×1
GLOVE ECLIPSE 8.0 STRL XLNG CF (GLOVE) ×2 IMPLANT
GLOVE ORTHO TXT STRL SZ7.5 (GLOVE) ×4 IMPLANT
GOWN SPEC L3 XXLG W/TWL (GOWN DISPOSABLE) ×2 IMPLANT
GOWN STRL REUS W/TWL LRG LVL3 (GOWN DISPOSABLE) ×2 IMPLANT
HANDPIECE INTERPULSE COAX TIP (DISPOSABLE) ×2
KIT BASIN OR (CUSTOM PROCEDURE TRAY) ×2 IMPLANT
LIQUID BAND (GAUZE/BANDAGES/DRESSINGS) ×2 IMPLANT
MANIFOLD NEPTUNE II (INSTRUMENTS) ×2 IMPLANT
NDL SAFETY ECLIPSE 18X1.5 (NEEDLE) ×1 IMPLANT
NEEDLE HYPO 18GX1.5 SHARP (NEEDLE) ×2
PACK TOTAL JOINT (CUSTOM PROCEDURE TRAY) ×2 IMPLANT
PEN SKIN MARKING BROAD (MISCELLANEOUS) ×2 IMPLANT
POSITIONER SURGICAL ARM (MISCELLANEOUS) ×2 IMPLANT
SET HNDPC FAN SPRY TIP SCT (DISPOSABLE) ×1 IMPLANT
SET PAD KNEE POSITIONER (MISCELLANEOUS) ×2 IMPLANT
SUCTION FRAZIER 12FR DISP (SUCTIONS) ×2 IMPLANT
SUT MNCRL AB 4-0 PS2 18 (SUTURE) ×2 IMPLANT
SUT VIC AB 1 CT1 36 (SUTURE) ×3 IMPLANT
SUT VIC AB 2-0 CT1 27 (SUTURE) ×6
SUT VIC AB 2-0 CT1 TAPERPNT 27 (SUTURE) ×3 IMPLANT
SUT VLOC 180 0 24IN GS25 (SUTURE) ×2 IMPLANT
SYR 50ML LL SCALE MARK (SYRINGE) ×2 IMPLANT
TOWEL OR 17X26 10 PK STRL BLUE (TOWEL DISPOSABLE) ×2 IMPLANT
TOWEL OR NON WOVEN STRL DISP B (DISPOSABLE) IMPLANT
TRAY FOLEY CATH 14FRSI W/METER (CATHETERS) ×2 IMPLANT
WATER STERILE IRR 1500ML POUR (IV SOLUTION) ×2 IMPLANT
WRAP KNEE MAXI GEL POST OP (GAUZE/BANDAGES/DRESSINGS) ×2 IMPLANT
YANKAUER SUCT BULB TIP 10FT TU (MISCELLANEOUS) ×2 IMPLANT

## 2014-03-23 NOTE — Progress Notes (Signed)
Inpatient Diabetes Program Recommendations  AACE/ADA: New Consensus Statement on Inpatient Glycemic Control (2013)  Target Ranges:  Prepandial:   less than 140 mg/dL      Peak postprandial:   less than 180 mg/dL (1-2 hours)      Critically ill patients:  140 - 180 mg/dL   Reason for Assessment:  Results for Paul Baker, Paul Baker (MRN [id number]) as of 03/23/2014 13:29  Ref. Range 03/23/2014 09:39 03/23/2014 11:48  Glucose-Capillary Latest Range: 70-99 mg/dL 314 (H) 330 (H)   Diabetes history: Type 2 diabetes Outpatient Diabetes medications: Levemir 50 units bid, Humalog 15 units tid with meals Current orders for Inpatient glycemic control:   Patient is currently in surgery.  Consider resumption of home medications post-op.   May consider Levemir 40 units bid (20% reduction) and Novolog 10 units tid with meals.  Also please check A1C to determine pre-hospitalization glycemic control.  Thanks, Adah Perl, RN, BC-ADM Inpatient Diabetes Coordinator Pager [phone] (8a-5p)

## 2014-03-23 NOTE — Anesthesia Preprocedure Evaluation (Addendum)
Anesthesia Evaluation  Patient identified by MRN, date of birth, ID band Patient awake    Reviewed: Allergy & Precautions, NPO status , Patient's Chart, lab work & pertinent test results  History of Anesthesia Complications (+) history of anesthetic complications  Airway Mallampati: II  TM Distance: >3 FB Neck ROM: Full    Dental no notable dental hx.    Pulmonary asthma , sleep apnea , former smoker,  breath sounds clear to auscultation  Pulmonary exam normal       Cardiovascular hypertension, + CAD Rhythm:Regular Rate:Normal     Neuro/Psych  Headaches, negative psych ROS   GI/Hepatic negative GI ROS, Neg liver ROS,   Endo/Other  diabetes, Type 2, Oral Hypoglycemic Agents, Insulin DependentHypothyroidism   Renal/GU negative Renal ROS  negative genitourinary   Musculoskeletal  (+) Arthritis -,   Abdominal (+) + obese,   Peds negative pediatric ROS (+)  Hematology negative hematology ROS (+)   Anesthesia Other Findings   Reproductive/Obstetrics negative OB ROS                            Anesthesia Physical Anesthesia Plan  ASA: III  Anesthesia Plan: Spinal   Post-op Pain Management:    Induction: Intravenous  Airway Management Planned:   Additional Equipment:   Intra-op Plan:   Post-operative Plan: Extubation in OR  Informed Consent: I have reviewed the patients History and Physical, chart, labs and discussed the procedure including the risks, benefits and alternatives for the proposed anesthesia with the patient or authorized representative who has indicated his/her understanding and acceptance.   Dental advisory given  Plan Discussed with: CRNA  Anesthesia Plan Comments: (Discussed risks and benefits of and differences between spinal and general. Discussed risks of spinal including headache, backache, failure, bleeding, infection, and nerve damage. Patient consents to  spinal. Questions answered. Coagulation studies and platelet count acceptable.)       Anesthesia Quick Evaluation

## 2014-03-23 NOTE — Interval H&P Note (Signed)
History and Physical Interval Note:  03/23/2014 11:23 AM  Paul Baker  has presented today for surgery, with the diagnosis of RIGHT KNEE OA AND RETAINE TIBIAL  STAPLE  The various methods of treatment have been discussed with the patient and family. After consideration of risks, benefits and other options for treatment, the patient has consented to  Procedure(s): RIGHT TOTAL KNEE ARTHROPLASTY AND REMOVAL RIGHT TIBIAL  DEEP IMPLANT STAPLE (Right) as a surgical intervention .  The patient's history has been reviewed, patient examined, no change in status, stable for surgery.  I have reviewed the patient's chart and labs.  Questions were answered to the patient's satisfaction.     Mauri Pole

## 2014-03-23 NOTE — Progress Notes (Signed)
Utilization review completed.  

## 2014-03-23 NOTE — Progress Notes (Signed)
Pt blood sugar this am 314. Anesthesia paged and order for 10 units regular insulin pre op.

## 2014-03-23 NOTE — Transfer of Care (Signed)
Immediate Anesthesia Transfer of Care Note  Patient: Paul Baker  Procedure(s) Performed: Procedure(s): RIGHT TOTAL KNEE ARTHROPLASTY AND REMOVAL RIGHT TIBIAL  DEEP IMPLANT STAPLE (Right)  Patient Location: PACU  Anesthesia Type:Spinal  Level of Consciousness: awake, alert  and oriented  Airway & Oxygen Therapy: Patient Spontanous Breathing and Patient connected to face mask oxygen  Post-op Assessment: Report given to RN and Post -op Vital signs reviewed and stable  Post vital signs: Reviewed and stable  Last Vitals:  Filed Vitals:   03/23/14 0937  BP: 125/66  Pulse: 74  Temp: 36.5 C  Resp: 18    Complications: No apparent anesthesia complications

## 2014-03-23 NOTE — Brief Op Note (Signed)
03/23/2014  2:01 PM  PATIENT:  Paul Baker  64 y.o. male  PRE-OPERATIVE DIAGNOSIS:  1.  RIGHT KNEE primary osteoarthritis, 2.   RETAINED TIBIAL  STAPLE from previous Orthopaedic surgery  POST-OPERATIVE DIAGNOSIS:  1.  RIGHT KNEE primary osteoarthritis, 2.   RETAINED TIBIAL  STAPLE from previous Orthopaedic surgery  PROCEDURE:  Procedure(s): 1.  RIGHT TOTAL KNEE ARTHROPLASTY 2.  REMOVAL RIGHT TIBIAL  DEEP IMPLANT STAPLE (Right)  SURGEON:  Surgeon(s) and Role:    * Paralee Cancel, MD - Primary  PHYSICIAN ASSISTANT: Danae Orleans, PA-C  ANESTHESIA:   spinal  EBL:  Total I/O In: 2000 [I.V.:2000] Out: 500 [Urine:500]  BLOOD ADMINISTERED:none  DRAINS: none   LOCAL MEDICATIONS USED:  MARCAINE     SPECIMEN:  No Specimen  DISPOSITION OF SPECIMEN:  N/A  COUNTS:  YES  TOURNIQUET:   Total Tourniquet Time Documented: Thigh (Right) - 51 minutes Total: Thigh (Right) - 51 minutes   DICTATION: .Other Dictation: Dictation Number [id number]  PLAN OF CARE: Admit to inpatient   PATIENT DISPOSITION:  PACU - hemodynamically stable.   Delay start of Pharmacological VTE agent (>24hrs) due to surgical blood loss or risk of bleeding: no

## 2014-03-23 NOTE — Anesthesia Procedure Notes (Signed)
Spinal Patient location during procedure: OR End time: 03/23/2014 12:16 PM Staffing Resident/CRNA: Noralyn Pick D Performed by: anesthesiologist and resident/CRNA  Preanesthetic Checklist Completed: patient identified, site marked, surgical consent, pre-op evaluation, timeout performed, IV checked, risks and benefits discussed and monitors and equipment checked Spinal Block Patient position: sitting Prep: Betadine Patient monitoring: heart rate, continuous pulse ox and blood pressure Approach: midline Location: L3-4 Injection technique: single-shot Needle Needle type: Sprotte and Pencil-Tip  Needle gauge: 24 G Needle length: 9 cm Assessment Sensory level: T6 Additional Notes Expiration date of kit checked and confirmed. Patient tolerated procedure well, without complications.

## 2014-03-23 NOTE — Anesthesia Postprocedure Evaluation (Signed)
  Anesthesia Post-op Note  Patient: Paul Baker  Procedure(s) Performed: Procedure(s) (LRB): RIGHT TOTAL KNEE ARTHROPLASTY AND REMOVAL RIGHT TIBIAL  DEEP IMPLANT STAPLE (Right)  Patient Location: PACU  Anesthesia Type: Spinal  Level of Consciousness: awake and alert   Airway and Oxygen Therapy: Patient Spontanous Breathing  Post-op Pain: mild  Post-op Assessment: Post-op Vital signs reviewed, Patient's Cardiovascular Status Stable, Respiratory Function Stable, Patent Airway and No signs of Nausea or vomiting  Last Vitals:  Filed Vitals:   03/23/14 1521  BP: 94/55  Pulse: 62  Temp: 36.9 C  Resp: 14    Post-op Vital Signs: stable   Complications: No apparent anesthesia complications

## 2014-03-24 LAB — CBC
HEMATOCRIT: 32.7 % — AB (ref 39.0–52.0)
Hemoglobin: 10.2 g/dL — ABNORMAL LOW (ref 13.0–17.0)
MCH: 29.7 pg (ref 26.0–34.0)
MCHC: 31.2 g/dL (ref 30.0–36.0)
MCV: 95.1 fL (ref 78.0–100.0)
Platelets: 206 10*3/uL (ref 150–400)
RBC: 3.44 MIL/uL — AB (ref 4.22–5.81)
RDW: 14.7 % (ref 11.5–15.5)
WBC: 7.6 10*3/uL (ref 4.0–10.5)

## 2014-03-24 LAB — BASIC METABOLIC PANEL
ANION GAP: 6 (ref 5–15)
BUN: 28 mg/dL — ABNORMAL HIGH (ref 6–23)
CO2: 28 mmol/L (ref 19–32)
CREATININE: 1.43 mg/dL — AB (ref 0.50–1.35)
Calcium: 8 mg/dL — ABNORMAL LOW (ref 8.4–10.5)
Chloride: 99 mmol/L (ref 96–112)
GFR calc Af Amer: 59 mL/min — ABNORMAL LOW (ref >90)
GFR calc non Af Amer: 51 mL/min — ABNORMAL LOW (ref >90)
GLUCOSE: 248 mg/dL — AB (ref 70–99)
Potassium: 4.9 mmol/L (ref 3.5–5.1)
Sodium: 133 mmol/L — ABNORMAL LOW (ref 135–145)

## 2014-03-24 LAB — GLUCOSE, CAPILLARY
GLUCOSE-CAPILLARY: 277 mg/dL — AB (ref 70–99)
Glucose-Capillary: 249 mg/dL — ABNORMAL HIGH (ref 70–99)

## 2014-03-24 MED ORDER — RIVAROXABAN 10 MG PO TABS: 10.0000 mg | ORAL_TABLET | ORAL | Status: DC

## 2014-03-24 MED ORDER — FERROUS SULFATE 325 (65 FE) MG PO TABS: 325.0000 mg | ORAL_TABLET | Freq: Three times a day (TID) | ORAL | Status: DC

## 2014-03-24 MED ORDER — DOCUSATE SODIUM 100 MG PO CAPS: 100.0000 mg | ORAL_CAPSULE | Freq: Two times a day (BID) | ORAL | Status: DC

## 2014-03-24 MED ORDER — METHOCARBAMOL 500 MG PO TABS: 500.0000 mg | ORAL_TABLET | Freq: Four times a day (QID) | ORAL | Status: DC | PRN

## 2014-03-24 MED ORDER — HYDROCODONE-ACETAMINOPHEN 10-325 MG PO TABS: 1.0000 | ORAL_TABLET | ORAL | Status: DC | PRN

## 2014-03-24 MED ORDER — POLYETHYLENE GLYCOL 3350 17 G PO PACK
17.0000 g | PACK | Freq: Two times a day (BID) | ORAL | Status: DC
Start: 2014-03-24 — End: 2014-12-03

## 2014-03-24 MED ORDER — ASPIRIN EC 325 MG PO TBEC: 325.0000 mg | DELAYED_RELEASE_TABLET | Freq: Two times a day (BID) | ORAL | Status: AC

## 2014-03-24 MED ORDER — DOXYCYCLINE HYCLATE 100 MG PO CAPS: 100.0000 mg | ORAL_CAPSULE | Freq: Two times a day (BID) | ORAL | Status: DC

## 2014-03-24 NOTE — Discharge Instructions (Addendum)
Information on my medicine - XARELTO® (Rivaroxaban) ° °This medication education was reviewed with me or my healthcare representative as part of my discharge preparation.  The pharmacist that spoke with me during my hospital stay was:  Runyon, Amanda, RPH ° °Why was Xarelto® prescribed for you? °Xarelto® was prescribed for you to reduce the risk of blood clots forming after orthopedic surgery. The medical term for these abnormal blood clots is venous thromboembolism (VTE). ° °What do you need to know about xarelto® ? °Take your Xarelto® ONCE DAILY at the same time every day. °You may take it either with or without food. ° °If you have difficulty swallowing the tablet whole, you may crush it and mix in applesauce just prior to taking your dose. ° °Take Xarelto® exactly as prescribed by your doctor and DO NOT stop taking Xarelto® without talking to the doctor who prescribed the medication.  Stopping without other VTE prevention medication to take the place of Xarelto® may increase your risk of developing a clot. ° °After discharge, you should have regular check-up appointments with your healthcare provider that is prescribing your Xarelto®.   ° °What do you do if you miss a dose? °If you miss a dose, take it as soon as you remember on the same day then continue your regularly scheduled once daily regimen the next day. Do not take two doses of Xarelto® on the same day.  ° °Important Safety Information °A possible side effect of Xarelto® is bleeding. You should call your healthcare provider right away if you experience any of the following: °  Bleeding from an injury or your nose that does not stop. °  Unusual colored urine (red or dark brown) or unusual colored stools (red or black). °  Unusual bruising for unknown reasons. °  A serious fall or if you hit your head (even if there is no bleeding). ° °Some medicines may interact with Xarelto® and might increase your risk of bleeding while on Xarelto®. To help avoid this,  consult your healthcare provider or pharmacist prior to using any new prescription or non-prescription medications, including herbals, vitamins, non-steroidal anti-inflammatory drugs (NSAIDs) and supplements. ° °This website has more information on Xarelto®: www.xarelto.com. ° ° ° °

## 2014-03-24 NOTE — Care Management Note (Signed)
    Page 1 of 2   03/24/2014     11:18:56 AM CARE MANAGEMENT NOTE 03/24/2014  Patient:  DEANDRA, GADSON   Account Number:  [id number]  Date Initiated:  03/24/2014  Documentation initiated by:  [facility]  Subjective/Objective Assessment:   BBC:[id number] TOTAL KNEE ARTHROPLASTY AND REMOVAL RIGHT TIBIAL  DEEP IMPLANT STAPLE (Right)     Action/Plan:   discharge planning   Anticipated DC Date:  03/24/2014   Anticipated DC Plan:  [place]  CM consult      [facility] Choice  HOME HEALTH   Choice offered to / List presented to:  C-1 Patient   DME arranged  Vassie Moselle      DME agency  [place] arranged  [place]   Status of service:  Completed, signed off Medicare Important Message given?   (If response is "NO", the following Medicare IM given date fields will be blank) Date Medicare IM given:   Medicare IM given by:   Date Additional Medicare IM given:   Additional Medicare IM given by:    Discharge Disposition:  [place]  Per UR Regulation:    If discussed at Long Length of Stay Meetings, dates discussed:    Comments:  11:00 Cm met with pt in room to offer choice of home healtha agency. Pt chooses [facility] to render HHPT.  Address and contact information verified by pt.  CM called [facility] rep, Lecretia to please deliver the rolling walker to room prior to discharge.  Referral emailed to [employer], Tim for [facility].  No other CM needs were communicated.  Mariane Masters, BSN, CM [phone].

## 2014-03-24 NOTE — Progress Notes (Signed)
RT placed patient on CPAP. Sterile water was added to water chamber for humidification. Oxygen bleed into tubing. Home setting is 13 cmH2O. Patient is tolerating well. RT will continue to assess and monitor as needed.

## 2014-03-24 NOTE — Evaluation (Signed)
Physical Therapy Evaluation Patient Details Name: Paul Baker MRN: [id number] DOB: July 13, 1950 Today's Date: 03/24/2014   History of Present Illness  Pt is a 64 year old male s/p R TKA with hx of L TKA  Clinical Impression  Pt is s/p R TKA resulting in the deficits listed below (see PT Problem List).   Pt will benefit from skilled PT to increase their independence and safety with mobility to allow discharge to the venue listed below.   Pt mobilizing well POD #1 and plans to d/c home with spouse.     Follow Up Recommendations Home health PT    Equipment Recommendations  Rolling walker with 5" wheels    Recommendations for Other Services       Precautions / Restrictions Precautions Precautions: Knee Restrictions Other Position/Activity Restrictions: WBAT      Mobility  Bed Mobility Overal bed mobility: Needs Assistance Bed Mobility: Supine to Sit     Supine to sit: Supervision     General bed mobility comments: verbal cues for self assist  Transfers Overall transfer level: Needs assistance Equipment used: Rolling walker (2 wheeled) Transfers: Sit to/from Stand Sit to Stand: Min guard         General transfer comment: verbal cues for safe technique  Ambulation/Gait Ambulation/Gait assistance: Min guard Ambulation Distance (Feet): 80 Feet Assistive device: Rolling walker (2 wheeled) Gait Pattern/deviations: Step-to pattern;Antalgic     General Gait Details: verbal cues for sequence, technique, RW distance  Stairs            Wheelchair Mobility    Modified Rankin (Stroke Patients Only)       Balance                                             Pertinent Vitals/Pain Pain Assessment: 0-10 Pain Score: 2  Pain Location: R knee Pain Descriptors / Indicators: Aching;Sore Pain Intervention(s): Limited activity within patient's tolerance;Monitored during session;Repositioned;Premedicated before session;Ice applied    Home  Living Family/patient expects to be discharged to:: Private residence Living Arrangements: Spouse/significant other   Type of Home: House Home Access: Stairs to enter   Technical brewer of Steps: 1 Home Layout: Able to live on main level with bedroom/bathroom Home Equipment: Cane - single point      Prior Function Level of Independence: Independent with assistive device(s)               Hand Dominance        Extremity/Trunk Assessment               Lower Extremity Assessment: RLE deficits/detail RLE Deficits / Details: good quad contraction, ROM TBA       Communication   Communication: No difficulties  Cognition Arousal/Alertness: Awake/alert Behavior During Therapy: WFL for tasks assessed/performed Overall Cognitive Status: Within Functional Limits for tasks assessed                      General Comments      Exercises        Assessment/Plan    PT Assessment Patient needs continued PT services  PT Diagnosis Difficulty walking;Acute pain   PT Problem List Decreased strength;Decreased range of motion;Decreased knowledge of precautions;Decreased mobility;Decreased knowledge of use of DME  PT Treatment Interventions Functional mobility training;Stair training;Gait training;DME instruction;Patient/family education;Therapeutic exercise;Therapeutic activities   PT Goals (Current goals can  be found in the Care Plan section) Acute Rehab PT Goals PT Goal Formulation: With patient Time For Goal Achievement: 03/28/14 Potential to Achieve Goals: Good    Frequency 7X/week   Barriers to discharge        Co-evaluation               End of Session Equipment Utilized During Treatment: Gait belt Activity Tolerance: Patient tolerated treatment well Patient left: in chair;with call bell/phone within reach           Time: 0921-0936 PT Time Calculation (min) (ACUTE ONLY): 15 min   Charges:   PT Evaluation $Initial PT Evaluation  Tier I: 1 Procedure     PT G Codes:        Zae Kirtz,KATHrine E 03/24/2014, 12:14 PM Carmelia Bake, PT, DPT 03/24/2014 Pager: [phone]

## 2014-03-24 NOTE — Progress Notes (Signed)
Physical Therapy Treatment Note    03/24/14 1400  PT Visit Information  Last PT Received On 03/24/14  Assistance Needed +1  History of Present Illness Pt is a 64 year old male s/p R TKA with hx of L TKA  PT Time Calculation  PT Start Time (ACUTE ONLY) 1420  PT Stop Time (ACUTE ONLY) 1445  PT Time Calculation (min) (ACUTE ONLY) 25 min  Subjective Data  Subjective Pt ambulated in hallway, practiced 1 step, performed exercises. Pt progressing well POD #1. Pt had no further questions or concerns and feels ready to discharge home today.   Precautions  Precautions Knee  Restrictions  Other Position/Activity Restrictions WBAT  Pain Assessment  Pain Assessment 0-10  Pain Score 5  Pain Location R Knee  Pain Descriptors / Indicators Aching;Sore  Pain Intervention(s) Limited activity within patient's tolerance;Premedicated before session;Monitored during session  Cognition  Arousal/Alertness Awake/alert  Behavior During Therapy WFL for tasks assessed/performed  Overall Cognitive Status Within Functional Limits for tasks assessed  Bed Mobility  General bed mobility comments Pt was in recliner on arrival   Transfers  Overall transfer level Needs assistance  Equipment used Rolling walker (2 wheeled)  Transfers Sit to/from Stand  Sit to Stand Min guard  General transfer comment verbal cues for safe technique  Ambulation/Gait  Ambulation/Gait assistance Min guard  Ambulation Distance (Feet) 100 Feet  Assistive device Rolling walker (2 wheeled)  Gait Pattern/deviations Step-to pattern  General Gait Details verbal cues for improving knee flexion(R knee stiff during gait)  Exercises  Exercises Total Joint  Total Joint Exercises  Ankle Circles/Pumps AROM;Both;15 reps;Supine  Quad Sets AROM;Right;15 reps;Supine  Towel Squeeze AROM;Both;15 reps;Supine  Short Arc Quad AROM;Right;15 reps;Supine  Heel Slides Right;15 reps;Supine;AAROM  Hip ABduction/ADduction AROM;Right;15 reps;Supine   Straight Leg Raises Right;15 reps;AAROM  Goniometric ROM approx 75* AAROM with heel slides  PT - End of Session  Equipment Utilized During Treatment Gait belt  Activity Tolerance Patient tolerated treatment well  Patient left in chair;with call bell/phone within reach  PT - Assessment/Plan  PT Plan Current plan remains appropriate  PT Frequency (ACUTE ONLY) 7X/week  Follow Up Recommendations Home health PT  PT equipment Rolling walker with 5" wheels  PT Goal Progression  Progress towards PT goals Progressing toward goals  PT General Charges  $$ ACUTE PT VISIT 1 Procedure  PT Treatments  $Gait Training 8-22 mins  $Therapeutic Exercise 8-22 mins   Carmelia Bake, PT, DPT 03/24/2014 Pager: [phone]

## 2014-03-24 NOTE — Progress Notes (Signed)
OT Cancellation Note  Patient Details Name: Paul Baker MRN: [id number] DOB: 06-Jun-1950   Cancelled Treatment:    Reason Eval/Treat Not Completed: Other (comment).  Pt screened for OT.  He has a handcapped height commode with sink and grab bar and tub bench at home.  Additionally, he has assistance for ADLs.  He is moving well with PT.  Will sign off.  [address] OTR/L [phone] 03/24/2014 03/24/2014, 2:22 PM

## 2014-03-24 NOTE — Progress Notes (Signed)
Patient ID: Paul Baker, male   DOB: [birth year], 64 y.o.   MRN: [id number] Subjective: 1 Day Post-Op Procedure(s) (LRB): RIGHT TOTAL KNEE ARTHROPLASTY AND REMOVAL RIGHT TIBIAL  DEEP IMPLANT STAPLE (Right)    Patient reports pain as mild to moderate thus far but no real activity yet.  No events over night  Objective:   VITALS:   Filed Vitals:   03/24/14 0700  BP: 123/64  Pulse: 53  Temp: 97.7 F (36.5 C)  Resp: 16    Neurovascular intact Incision: dressing C/D/I  LABS  Recent Labs  03/24/14 0521  HGB 10.2*  HCT 32.7*  WBC 7.6  PLT 206     Recent Labs  03/24/14 0521  NA 133*  K 4.9  BUN 28*  CREATININE 1.43*  GLUCOSE 248*    No results for input(s): LABPT, INR in the last 72 hours.   Assessment/Plan: 1 Day Post-Op Procedure(s) (LRB): RIGHT TOTAL KNEE ARTHROPLASTY AND REMOVAL RIGHT TIBIAL  DEEP IMPLANT STAPLE (Right)   Advance diet Up with therapy Discharge home with home health today versus tomorrow depending on progress with therapy  Reviewed goals  Reviewed concerns regarding his diabetes and strict control.  Will probably prophylactically treat him with PO antibiotics due to his history of left TKR infection

## 2014-03-24 NOTE — Op Note (Signed)
NAMEEASTEN, MACEACHERN NO.:  [id number]  MEDICAL RECORD NO.:  [id number]  LOCATION:  2                         FACILITY:  [facility]  PHYSICIAN:  Pietro Cassis. Alvan Dame, M.D.  DATE OF BIRTH:  03/25/1950  DATE OF PROCEDURE:  03/23/2014 DATE OF DISCHARGE:                              OPERATIVE REPORT   PREOPERATIVE DIAGNOSES: 1. Right knee osteoarthritis, primary. 2. Retained proximal tibia knee staples.  POSTOPERATIVE DIAGNOSES: 1. Right knee osteoarthritis, primary. 2. Retained proximal tibia knee staples. 3. Uncontrolled diabetes with an A1c level last reported in August     2015 at 8:2.  PROCEDURES: 1. Right total knee arthroplasty utilizing a DePuy Attune knee system     posterior stabilized size 5 femur, 5 tibia, a 7 insert AOX, and 38     patella button anatomic. 2. Removal of deep implant and tibial staples.  SURGEON:  Pietro Cassis. Alvan Dame, M.D.  ASSISTANT:  Danae Orleans, PA-C.  Note that Mr, Guinevere Scarlet was present for the entirety of the case from preoperative positioning perioperative management of the opposite extremity, general facilitation of the case, and primary wound closure.  ANESTHESIA:  Spinal.  COMPLICATION:  None.  DRAINS:  None.  TOURNIQUET TIME:  51 minutes at 250 mmHg.  INDICATION FOR PROCEDURE:  Mr. Rockefeller  is a 64 year old male who I previously had surgical intervention on his left knee managing an infection with a two-stage technique.  He eventually had recovered from that surgery and been doing well until recently having developed pain in the right knee.  He was having enough discomfort and problems in his right knee that he wished to proceed with total knee arthroplasty. Risks and benefits were discussed, predominantly focused on his infection risk with his diabetes and difficulties and challenging managing this.  He does feel his quality of life was significantly affected enough with his right knee that he would like to proceed  despite these risks.  He will do his best to manage his diabetes in the postoperative period to prevent complications.  Postoperative course was reviewed.  Risks and benefits were discussed including DVT, component failure, need for future surgery.  Consent was obtained.  PROCEDURE IN DETAIL:  The patient was brought to the operative theater. Once adequate anesthesia, preoperative antibiotics, Ancef 2 g administered, please note that we would not give Decadron as a routine medication based on his uncontrolled diabetes, tranexamic acid was given topically at the time of joint closure.  The patient was positioned supine with the right thigh tourniquet placed, right lower extremity was then prepped and draped in sterile fashion with the [facility] leg holder. Time-out was performed identifying the patient, planned procedure, and extremity.  Leg was exsanguinated, tourniquet elevated to 250 mmHg.  The patient was noted to have 2 incisions, 1 medial and 1 lateral parapatellar in a V-type fashion towards the tubercle.  At this point, I used a medial based incision excising the old scar below the tibial tubercle, then extending in a proximal to allow for exposure.  This was 1 thing that added a little bit of a challenge for the case. Following exposure of the extensor mechanism, standard median arthrotomy was made, however, the patient was  noted to have significant patella baja at least intraoperatively the radiographs did not really confirm this.  As I opened up the proximal aspect of the tibia doing proximal medial exposure, I also then carefully dissected the patella tendon off the tibial tubercle to identify the screw which was are going to be a prominent on the proximal aspect of the knee joint.  Once the staple was exposed, it was easily removed without any significant bony ingrowth. It was sent off the table cleaned for the patient.  At this point, I furthered my dissection laterally  given the patellar baja and the difficulty with exposure to allow for subluxation of the patella and to allow for exposure of the proximal tibia and distal femur.  Following this initial exposure without evidence of any complicating features, I attended to 1st the patella.  The patella was everted not all the way back to 180 degrees but more probably like 100 degrees- 120 degrees.  At which point, I was able to remove soft tissues around the patella then cut the patella.  Precut measurement turned out to be 23 or 24 mm, I resected down to about 14-15 mm and used a 38 patellar button to restore height.  The anatomic button was selected to align this to the proximal aspect of the native patella.  Lug holes were drilled and a metal Shim was placed.  Further soft tissue removed off the lateral side as necessary to allow for the knee flexion.  The medial and lateral retractors were placed.  The femoral canal was opened with a drill, irrigated to try to prevent fat emboli.  The intramedullary rod was then passed in a 3 degrees of valgus, 9 mm of bone was resected off the distal femur.  Following this resection, the tibia was subluxated anteriorly and using the extramedullary guide, I resected the proximal tibia bone at 2 mm off the disk off the proximal medial tibia.  Once this bone was all removed which was noted to be soft and somewhat fragmented and upon its removal, the extension gap was noted to be symmetric with a size 5 insert.  Given these findings, the knee was then flexed back up.  Retractors were placed.  I assessed in the alignment.  With the tibial tray in place, the alignment rod seemed to pass parallel to the tibial spine into the middle 3rd of the ankle and into the 2nd metatarsal region.  Given these findings, I then attended to the femur.  The femur was sized and a sized to a 5 Attune knee with the size 5 rotation block was then pinned into position using the proximal tibia  to set rotation using the size 5 tibial gaps in flexion.  The rotation block was then removed and the 4- in-1 cutting block pinned into position.  The anterior-posterior chamfer cuts were then made without difficulty or notching.  Final box cut was then made using the size 5 box cut into the lateral aspect of distal femur.  The tibia was then subluxated.  The tibia was now subluxated anteriorly and tibia cut surfacing to be best fit with a size 5, the size 5 tray was then pinned into position drilled and keel punch.  A trial reduction now carried out with 5 femur, 5 tibia, and initially a 6 mm insert.  Noting slight hyperextension, I elected to go up to a size 7 insert  which I found the knee to be stable from extension to flexion with the  patella tracking through the trochlea without application of pressure.  Given all these findings, the trial components were all removed.  Following final debridement of any synovium that was excessive, I injected the synovial capsule junction the knee with combination of 30 mL of 0.25% Marcaine with epinephrine 1 mL of Toradol, and 30 mL of normal saline.  Cement was mixed.  The knee was irrigated with normal saline solution and final components opened on the back table.  The final components were cemented into position, and the knee was brought to extension with a size 7 trial insert.  While the cement was curing, excessive cement was removed throughout the knee. Once the cement had fully cured, excessive cement was removed throughout the knee.  Following re-irrigation of the knee, I selected the final size 7 insert to match the 5 femur as posterior stabilized AOX insert was then placed.  Following this the knee was re-irrigated with normal saline solution, tourniquet had been let down at 51 minutes without significant hemostasis.  Extensor mechanism through a medial parapatellar arthrotomy was then closed using #1 Vicryl and 0 V-Loc suture.  The  remainder of the wound, the skin wound which was a medial based incision was closed with 2-0 Vicryl and running 4-0 Monocryl.  The knee was then cleaned, dried, and dressed sterilely using surgical glue and an Aquacel dressing.  He was then brought to recovery room in stable condition, tolerating the procedure well.  Findings were reviewed with family.  We will have to closely monitor the sugars while he is in the hospital, place him on a strict insulin scale as well as his oral medications.  I will recheck his A1c level while he is in the hospital.  We will work aggressively with therapy to maximize his overall recoverability.     Pietro Cassis Alvan Dame, M.D.     MDO/MEDQ  D:  03/23/2014  T:  03/24/2014  Job:  [id number]

## 2014-03-26 DIAGNOSIS — M1990 Unspecified osteoarthritis, unspecified site: Secondary | ICD-10-CM | POA: Diagnosis not present

## 2014-03-26 DIAGNOSIS — E1165 Type 2 diabetes mellitus with hyperglycemia: Secondary | ICD-10-CM | POA: Diagnosis not present

## 2014-03-26 DIAGNOSIS — Z471 Aftercare following joint replacement surgery: Secondary | ICD-10-CM | POA: Diagnosis not present

## 2014-03-26 DIAGNOSIS — E1140 Type 2 diabetes mellitus with diabetic neuropathy, unspecified: Secondary | ICD-10-CM | POA: Diagnosis not present

## 2014-03-26 DIAGNOSIS — I2510 Atherosclerotic heart disease of native coronary artery without angina pectoris: Secondary | ICD-10-CM | POA: Diagnosis not present

## 2014-03-26 DIAGNOSIS — M4805 Spinal stenosis, thoracolumbar region: Secondary | ICD-10-CM | POA: Diagnosis not present

## 2014-03-26 NOTE — Discharge Summary (Signed)
Physician Discharge Summary  Patient ID: KERNEY HOPFENSPERGER MRN: [id number] DOB/AGE: 63-Mar-1952 63 y.o.  Admit date: 03/23/2014 Discharge date: 03/24/2014   Procedures:  Procedure(s) (LRB): RIGHT TOTAL KNEE ARTHROPLASTY AND REMOVAL RIGHT TIBIAL  DEEP IMPLANT STAPLE (Right)  Attending Physician:  Dr. Paralee Cancel   Admission Diagnoses:   Right knee primary OA / pain and retained tibial staple  Discharge Diagnoses:  Principal Problem:   S/P right TKA Active Problems:   S/P knee replacement  Past Medical History  Diagnosis Date  . Arthritis   . Type 2 diabetes mellitus   . Mixed hyperlipidemia   . Essential hypertension, benign   . Septic arthritis of knee, left   . OSA (obstructive sleep apnea)   . Coronary atherosclerosis of native coronary artery     Diseased nondominant RCA  . Pulmonary embolism 2004  . DVT (deep venous thrombosis) 2005    RT ARM / SHOULDER   . Rotator cuff disorder     LEFT  . Colon polyp   . Morbid obesity   . RLS (restless legs syndrome)   . Hypersomnia     CPAP of 16 cm, diagnosed with AHI of 60 in 2012,epworth 21- narcolepsy?  [name] MRSA (methicillin resistant staph aureus) culture positive     08/2012  . Narcolepsy   . Complication of anesthesia 1991    DIFFICULTY WAKING UP "TEMP DROPPED" - NO PROBLEM SINCE WITH ANESTHESIA  . Asthma     NO RECENT PROBLEMS  . Headache   . Abnormal movement of lower extremity     ALSO UPPER EXTREMITIES - FOLLOWED BY DR. DOLMEYER  . History of nonmelanoma skin cancer   . Psoriasis   . History of transfusion   . PE (pulmonary embolism) 1982    AFTER KNEE ARTHROTOMY  . Hypothyroidism   . Cancer     HX SKION CANCER    HPI:    Paul Baker, 64 y.o. male, has a history of pain and functional disability in the right knee due to trauma and has failed non-surgical conservative treatments for greater than 12 weeks to include NSAID's and/or analgesics, corticosteriod injections, viscosupplementation  injections, use of assistive devices and activity modification. Onset of symptoms was gradual, starting >10 years ago with gradually worsening course since that time. The patient noted prior procedures on the knee to include arthroscopy on the right knee(s). Patient currently rates pain in the right knee(s) at 10 out of 10 with activity. Patient has night pain, worsening of pain with activity and weight bearing, pain that interferes with activities of daily living, pain with passive range of motion, crepitus and joint swelling. Patient has evidence of periarticular osteophytes and joint space narrowing by imaging studies. There is no active infection. Risks, benefits and expectations were discussed with the patient. Risks including but not limited to the risk of anesthesia, blood clots, nerve damage, blood vessel damage, failure of the prosthesis, infection and up to and including death. Patient understand the risks, benefits and expectations and wishes to proceed with surgery.   PCP: Rubbie Battiest, MD   Discharged Condition: good  Hospital Course:  Patient underwent the above stated procedure on 03/23/2014. Patient tolerated the procedure well and brought to the recovery room in good condition and subsequently to the floor.  POD #1 BP: 123/64 ; Pulse: 53 ; Temp: 97.7 F (36.5 C) ; Resp: 16 Patient reports pain as mild to moderate thus far but no real activity yet. No events over  night.  Ready to be discharged home. Dorsiflexion/plantar flexion intact, incision: dressing C/D/I, no cellulitis present and compartment soft.   LABS  Basename    HGB  10.2  HCT  32.7    Discharge Exam: General appearance: alert, cooperative and no distress Extremities: Homans sign is negative, no sign of DVT, no edema, redness or tenderness in the calves or thighs and no ulcers, gangrene or trophic changes  Disposition: Home with follow up in 2 weeks   Follow-up Information    Follow up with Mauri Pole, MD. Schedule an appointment as soon as possible for a visit in 2 weeks.   Specialty:  Orthopedic Surgery   Contact information:   [address] [place] [address] [phone]       Follow up with [facility].   Why:  home health physical therapy   Contact information:   [place] [address] [phone]       Follow up with [facility].   Why:  rolling walker    Contact information:   [address] [place]  [address] [phone]       Discharge Instructions    Call MD / Call 911    Complete by:  As directed   If you experience chest pain or shortness of breath, CALL 911 and be transported to the hospital emergency room.  If you develope a fever above 101 F, pus (white drainage) or increased drainage or redness at the wound, or calf pain, call your surgeon's office.     Change dressing    Complete by:  As directed   Maintain surgical dressing until follow up in the clinic. If the edges start to pull up, may reinforce with tape. If the dressing is no longer working, may remove and cover with gauze and tape, but must keep the area dry and clean.  Call with any questions or concerns.     Constipation Prevention    Complete by:  As directed   Drink plenty of fluids.  Prune juice may be helpful.  You may use a stool softener, such as Colace (over the counter) 100 mg twice a day.  Use MiraLax (over the counter) for constipation as needed.     Diet - low sodium heart healthy    Complete by:  As directed      Discharge instructions    Complete by:  As directed   Maintain surgical dressing until follow up in the clinic. If the edges start to pull up, may reinforce with tape. If the dressing is no longer working, may remove and cover with gauze and tape, but must keep the area dry and clean.  Follow up in 2 weeks at [facility]. Call with any questions or concerns.     Driving restrictions     Complete by:  As directed   No driving for 4 weeks     Increase activity slowly as tolerated    Complete by:  As directed      TED hose    Complete by:  As directed   Use stockings (TED hose) for 2 weeks on both leg(s).  You may remove them at night for sleeping.     Weight bearing as tolerated    Complete by:  As directed   Laterality:  right  Extremity:  Lower             Medication List  STOP taking these medications        acetaminophen 500 MG tablet  Commonly known as:  TYLENOL      TAKE these medications        amLODipine 5 MG tablet  Commonly known as:  NORVASC  TAKE ONE TABLET BY MOUTH ONCE DAILY.     Armodafinil 50 MG tablet  Commonly known as:  NUVIGIL  TAKE 2 TABLETS BY MOUTH IN THE MORNING AND 1 TABLET IN THE MID-AFTERNOON.     aspirin EC 325 MG tablet  Take 1 tablet (325 mg total) by mouth 2 (two) times daily. Take for 4 weeks. Start the day after finishing the Xarelto.  Start taking on:  04/08/2014     clobetasol 0.05 % external solution  Commonly known as:  TEMOVATE  Apply 1 application topically daily as needed (irritation.).     clonazePAM 1 MG tablet  Commonly known as:  KLONOPIN  For restless legs at night po, 1 tab     docusate sodium 100 MG capsule  Commonly known as:  COLACE  Take 1 capsule (100 mg total) by mouth 2 (two) times daily.     doxycycline 100 MG capsule  Commonly known as:  VIBRAMYCIN  Take 1 capsule (100 mg total) by mouth 2 (two) times daily.     fenofibrate 160 MG tablet  TAKE (1) TABLET BY MOUTH AT BEDTIME.     ferrous sulfate 325 (65 FE) MG tablet  Take 1 tablet (325 mg total) by mouth 3 (three) times daily after meals.     HUMALOG 100 UNIT/ML injection  Generic drug:  insulin lispro  Inject 15 Units into the skin 3 (three) times daily with meals. Patient is on a sliding scale     HYDROcodone-acetaminophen 10-325 MG per tablet  Commonly known as:  NORCO  Take 1-2 tablets by mouth every 4 (four) hours as  needed.     isosorbide mononitrate 60 MG 24 hr tablet  Commonly known as:  IMDUR  TAKE (1/2) TABLET BY MOUTH TWICE DAILY.     LEVEMIR 100 UNIT/ML injection  Generic drug:  insulin detemir  Inject 50 Units into the skin 2 (two) times daily.     levothyroxine 88 MCG tablet  Commonly known as:  SYNTHROID, LEVOTHROID  Take 88 mcg by mouth daily before breakfast.     loratadine 10 MG tablet  Commonly known as:  CLARITIN  Take 10 mg by mouth at bedtime as needed for allergies.     losartan 100 MG tablet  Commonly known as:  COZAAR  TAKE ONE TABLET BY MOUTH ONCE DAILY.     metFORMIN 500 MG tablet  Commonly known as:  GLUCOPHAGE  TAKE 2 TABLETS EACH MORNING, 1 TABLET AT NOON, AND 2 TABLETS EVERY EVENING.     methocarbamol 500 MG tablet  Commonly known as:  ROBAXIN  Take 1 tablet (500 mg total) by mouth every 6 (six) hours as needed for muscle spasms.     metoprolol succinate 50 MG 24 hr tablet  Commonly known as:  TOPROL-XL  Take 1 tablet (50 mg total) by mouth daily. Take with or immediately following a meal.     NITROSTAT 0.4 MG SL tablet  Generic drug:  nitroGLYCERIN  DISSOLVE 1 TABLET UNDER TONGUE EVERY 5 MINUTES UP TO 15 MIN FOR CHESTPAIN. IF NO RELIEF CALL 911.     omeprazole 20 MG capsule  Commonly known as:  PRILOSEC  TAKE (1) CAPSULE BY MOUTH TWICE DAILY.  ONE TOUCH ULTRA TEST test strip  Generic drug:  glucose blood  1 each by Other route. Four to five times daily.     polyethylene glycol packet  Commonly known as:  MIRALAX / GLYCOLAX  Take 17 g by mouth 2 (two) times daily.     polyvinyl alcohol 1.4 % ophthalmic solution  Commonly known as:  LIQUIFILM TEARS  Place 1 drop into both eyes daily as needed for dry eyes.     pravastatin 20 MG tablet  Commonly known as:  PRAVACHOL  Take 1 tablet (20 mg total) by mouth daily.     rivaroxaban 10 MG Tabs tablet  Commonly known as:  XARELTO  Take 1 tablet (10 mg total) by mouth daily.     rOPINIRole 4 MG  tablet  Commonly known as:  REQUIP  TAKE 1 TO 1 1/2 TABLET BY MOUTH AT BEDTIME.     sodium chloride 0.65 % Soln nasal spray  Commonly known as:  OCEAN  Place 1-2 sprays into both nostrils daily as needed for congestion.     Testosterone Cypionate & Prop 200-20 MG/ML Soln  Inject 1 mL into the muscle every 14 (fourteen) days.     triamterene-hydrochlorothiazide 37.5-25 MG per tablet  Commonly known as:  MAXZIDE-25  TAKE ONE TABLET BY MOUTH ONCE DAILY.     VENTOLIN HFA 108 (90 BASE) MCG/ACT inhaler  Generic drug:  albuterol  Inhale 1 puff into the lungs every 4 (four) hours as needed for wheezing or shortness of breath.     Vitamin D (Ergocalciferol) 50000 UNITS Caps capsule  Commonly known as:  DRISDOL  Take 50,000 Units by mouth every 7 (seven) days. Wednesday morning.         Signed: [place]. Kylee Nardozzi   PA-C  03/26/2014, 2:06 PM

## 2014-03-27 DIAGNOSIS — E1165 Type 2 diabetes mellitus with hyperglycemia: Secondary | ICD-10-CM | POA: Diagnosis not present

## 2014-03-27 DIAGNOSIS — I2510 Atherosclerotic heart disease of native coronary artery without angina pectoris: Secondary | ICD-10-CM | POA: Diagnosis not present

## 2014-03-27 DIAGNOSIS — Z471 Aftercare following joint replacement surgery: Secondary | ICD-10-CM | POA: Diagnosis not present

## 2014-03-27 DIAGNOSIS — M4805 Spinal stenosis, thoracolumbar region: Secondary | ICD-10-CM | POA: Diagnosis not present

## 2014-03-27 DIAGNOSIS — E1140 Type 2 diabetes mellitus with diabetic neuropathy, unspecified: Secondary | ICD-10-CM | POA: Diagnosis not present

## 2014-03-27 DIAGNOSIS — M1990 Unspecified osteoarthritis, unspecified site: Secondary | ICD-10-CM | POA: Diagnosis not present

## 2014-03-30 DIAGNOSIS — M4805 Spinal stenosis, thoracolumbar region: Secondary | ICD-10-CM | POA: Diagnosis not present

## 2014-03-30 DIAGNOSIS — E1165 Type 2 diabetes mellitus with hyperglycemia: Secondary | ICD-10-CM | POA: Diagnosis not present

## 2014-03-30 DIAGNOSIS — E1140 Type 2 diabetes mellitus with diabetic neuropathy, unspecified: Secondary | ICD-10-CM | POA: Diagnosis not present

## 2014-03-30 DIAGNOSIS — Z471 Aftercare following joint replacement surgery: Secondary | ICD-10-CM | POA: Diagnosis not present

## 2014-03-30 DIAGNOSIS — M1990 Unspecified osteoarthritis, unspecified site: Secondary | ICD-10-CM | POA: Diagnosis not present

## 2014-03-30 DIAGNOSIS — I2510 Atherosclerotic heart disease of native coronary artery without angina pectoris: Secondary | ICD-10-CM | POA: Diagnosis not present

## 2014-03-31 DIAGNOSIS — Z471 Aftercare following joint replacement surgery: Secondary | ICD-10-CM | POA: Diagnosis not present

## 2014-03-31 DIAGNOSIS — Z96651 Presence of right artificial knee joint: Secondary | ICD-10-CM | POA: Diagnosis not present

## 2014-04-02 DIAGNOSIS — Z471 Aftercare following joint replacement surgery: Secondary | ICD-10-CM | POA: Diagnosis not present

## 2014-04-02 DIAGNOSIS — M1990 Unspecified osteoarthritis, unspecified site: Secondary | ICD-10-CM | POA: Diagnosis not present

## 2014-04-02 DIAGNOSIS — E1140 Type 2 diabetes mellitus with diabetic neuropathy, unspecified: Secondary | ICD-10-CM | POA: Diagnosis not present

## 2014-04-02 DIAGNOSIS — E1165 Type 2 diabetes mellitus with hyperglycemia: Secondary | ICD-10-CM | POA: Diagnosis not present

## 2014-04-02 DIAGNOSIS — I2510 Atherosclerotic heart disease of native coronary artery without angina pectoris: Secondary | ICD-10-CM | POA: Diagnosis not present

## 2014-04-02 DIAGNOSIS — M4805 Spinal stenosis, thoracolumbar region: Secondary | ICD-10-CM | POA: Diagnosis not present

## 2014-04-03 DIAGNOSIS — Z471 Aftercare following joint replacement surgery: Secondary | ICD-10-CM | POA: Diagnosis not present

## 2014-04-03 DIAGNOSIS — Z96651 Presence of right artificial knee joint: Secondary | ICD-10-CM | POA: Diagnosis not present

## 2014-04-06 DIAGNOSIS — M4805 Spinal stenosis, thoracolumbar region: Secondary | ICD-10-CM | POA: Diagnosis not present

## 2014-04-06 DIAGNOSIS — I2510 Atherosclerotic heart disease of native coronary artery without angina pectoris: Secondary | ICD-10-CM | POA: Diagnosis not present

## 2014-04-06 DIAGNOSIS — Z471 Aftercare following joint replacement surgery: Secondary | ICD-10-CM | POA: Diagnosis not present

## 2014-04-06 DIAGNOSIS — M1990 Unspecified osteoarthritis, unspecified site: Secondary | ICD-10-CM | POA: Diagnosis not present

## 2014-04-06 DIAGNOSIS — E1140 Type 2 diabetes mellitus with diabetic neuropathy, unspecified: Secondary | ICD-10-CM | POA: Diagnosis not present

## 2014-04-06 DIAGNOSIS — E1165 Type 2 diabetes mellitus with hyperglycemia: Secondary | ICD-10-CM | POA: Diagnosis not present

## 2014-04-08 ENCOUNTER — Ambulatory Visit (HOSPITAL_COMMUNITY): Payer: Medicare Other | Admitting: Physical Therapy

## 2014-04-08 DIAGNOSIS — M4805 Spinal stenosis, thoracolumbar region: Secondary | ICD-10-CM | POA: Diagnosis not present

## 2014-04-08 DIAGNOSIS — Z96651 Presence of right artificial knee joint: Secondary | ICD-10-CM | POA: Diagnosis not present

## 2014-04-08 DIAGNOSIS — E1140 Type 2 diabetes mellitus with diabetic neuropathy, unspecified: Secondary | ICD-10-CM | POA: Diagnosis not present

## 2014-04-08 DIAGNOSIS — E1165 Type 2 diabetes mellitus with hyperglycemia: Secondary | ICD-10-CM | POA: Diagnosis not present

## 2014-04-08 DIAGNOSIS — M1990 Unspecified osteoarthritis, unspecified site: Secondary | ICD-10-CM | POA: Diagnosis not present

## 2014-04-08 DIAGNOSIS — Z471 Aftercare following joint replacement surgery: Secondary | ICD-10-CM | POA: Diagnosis not present

## 2014-04-08 DIAGNOSIS — I2510 Atherosclerotic heart disease of native coronary artery without angina pectoris: Secondary | ICD-10-CM | POA: Diagnosis not present

## 2014-04-09 ENCOUNTER — Ambulatory Visit: Payer: Medicare Other | Admitting: Family Medicine

## 2014-04-09 ENCOUNTER — Ambulatory Visit: Payer: Medicare Other | Admitting: Nutrition

## 2014-04-10 DIAGNOSIS — E1165 Type 2 diabetes mellitus with hyperglycemia: Secondary | ICD-10-CM | POA: Diagnosis not present

## 2014-04-10 DIAGNOSIS — E1140 Type 2 diabetes mellitus with diabetic neuropathy, unspecified: Secondary | ICD-10-CM | POA: Diagnosis not present

## 2014-04-10 DIAGNOSIS — M1990 Unspecified osteoarthritis, unspecified site: Secondary | ICD-10-CM | POA: Diagnosis not present

## 2014-04-10 DIAGNOSIS — I2510 Atherosclerotic heart disease of native coronary artery without angina pectoris: Secondary | ICD-10-CM | POA: Diagnosis not present

## 2014-04-10 DIAGNOSIS — M4805 Spinal stenosis, thoracolumbar region: Secondary | ICD-10-CM | POA: Diagnosis not present

## 2014-04-10 DIAGNOSIS — Z471 Aftercare following joint replacement surgery: Secondary | ICD-10-CM | POA: Diagnosis not present

## 2014-04-13 ENCOUNTER — Ambulatory Visit (HOSPITAL_COMMUNITY): Payer: Medicare Other | Attending: Orthopedic Surgery | Admitting: Physical Therapy

## 2014-04-13 ENCOUNTER — Encounter (HOSPITAL_COMMUNITY): Payer: Self-pay | Admitting: Physical Therapy

## 2014-04-13 DIAGNOSIS — Z96651 Presence of right artificial knee joint: Secondary | ICD-10-CM | POA: Diagnosis not present

## 2014-04-13 DIAGNOSIS — M25661 Stiffness of right knee, not elsewhere classified: Secondary | ICD-10-CM | POA: Insufficient documentation

## 2014-04-13 DIAGNOSIS — M25669 Stiffness of unspecified knee, not elsewhere classified: Secondary | ICD-10-CM

## 2014-04-13 DIAGNOSIS — Z471 Aftercare following joint replacement surgery: Secondary | ICD-10-CM | POA: Diagnosis not present

## 2014-04-13 DIAGNOSIS — R269 Unspecified abnormalities of gait and mobility: Secondary | ICD-10-CM

## 2014-04-13 DIAGNOSIS — M25561 Pain in right knee: Secondary | ICD-10-CM | POA: Insufficient documentation

## 2014-04-13 DIAGNOSIS — M6281 Muscle weakness (generalized): Secondary | ICD-10-CM

## 2014-04-13 NOTE — Patient Instructions (Signed)
Hamstring Stretch (Standing)   Standing, place one heel on chair or bench. Use one or both hands on thigh for support. Keeping torso straight, lean forward slowly until a stretch is felt in back of same thigh. Hold __45__ seconds and repeat three times.  Repeat with other leg. Do two times a day.   Copyright  VHI. All rights reserved.   Body-Weight Forward Lunge: Stable - Stationary (Active)   Put Right leg up on a stair, keep left leg on the floor.  Lunge forward so that your right knee is bending forward; when you get to the point where you cannot bend your knee any further, hold it for at least 10 seconds. Complete _1__ sets of _10__ repetitions. Perform _2__ sessions per day.  Copyright  VHI. All rights reserved.   Bridge   Lie back, legs bent. Inhale, pressing hips up. Keeping ribs in, lengthen lower back. Exhale, rolling down along spine from top. Repeat __15__ times. Do __2__ sessions per day.  Copyright  VHI. All rights reserved.   Abduction Lift   Lie with your bottom leg bent, top leg straight. Tighten muscles on outside of hip to raise top leg into the air, keeping your toes facing the wall and NOT letting them roll towards the ceiling. Hold __3__ seconds. Repeat _10___ times. Do __2__ sessions per day.  Copyright  VHI. All rights reserved.

## 2014-04-13 NOTE — Therapy (Signed)
[place], [place], [address] Phone: [phone]   Fax:  [phone]  Physical Therapy Evaluation  Patient Details  Name: Paul Baker MRN: [id number] Date of Birth: 06/23/1950 Referring Provider:  Paralee Cancel, MD  Encounter Date: 04/13/2014      PT End of Session - 04/13/14 1022    Visit Number 1   Number of Visits 12   Date for PT Re-Evaluation 05/11/14   Authorization Type Medicare/UHC   Authorization Time Period 04/13/14 to 06/13/14   Authorization - Visit Number 1   Authorization - Number of Visits 10   PT Start Time 0931   PT Stop Time 1013   PT Time Calculation (min) 42 min   Activity Tolerance Patient tolerated treatment well   Behavior During Therapy [facility] for tasks assessed/performed      Past Medical History  Diagnosis Date  . Arthritis   . Type 2 diabetes mellitus   . Mixed hyperlipidemia   . Essential hypertension, benign   . Septic arthritis of knee, left   . OSA (obstructive sleep apnea)   . Coronary atherosclerosis of native coronary artery     Diseased nondominant RCA  . Pulmonary embolism 2004  . DVT (deep venous thrombosis) 2005    RT ARM / SHOULDER   . Rotator cuff disorder     LEFT  . Colon polyp   . Morbid obesity   . RLS (restless legs syndrome)   . Hypersomnia     CPAP of 16 cm, diagnosed with AHI of 60 in 2012,epworth 21- narcolepsy?  [name] MRSA (methicillin resistant staph aureus) culture positive     08/2012  . Narcolepsy   . Complication of anesthesia 1991    DIFFICULTY WAKING UP "TEMP DROPPED" - NO PROBLEM SINCE WITH ANESTHESIA  . Asthma     NO RECENT PROBLEMS  . Headache   . Abnormal movement of lower extremity     ALSO UPPER EXTREMITIES - FOLLOWED BY DR. DOLMEYER  . History of nonmelanoma skin cancer   . Psoriasis   . History of transfusion   . PE (pulmonary embolism) 1982    AFTER KNEE ARTHROTOMY  . Hypothyroidism   . Cancer     HX SKION CANCER     Past Surgical History  Procedure Laterality Date  . Total knee arthroplasty  2003    LEFT  . Lumbar disc surgery      Left L3, L4, L5 discecotomy with decompression of L4 root  . Wrist surgery    . Total knee revision  2005    LEFT  . Colonoscopy    . Back surgery    . Knee surgery      X 25 TIMES  . Total knee arthroplasty Right 03/23/2014    Procedure: RIGHT TOTAL KNEE ARTHROPLASTY AND REMOVAL RIGHT TIBIAL  DEEP IMPLANT STAPLE;  Surgeon: Paralee Cancel, MD;  Location: [facility];  Service: Orthopedics;  Laterality: Right;    There were no vitals filed for this visit.  Visit Diagnosis:  Status post right knee replacement - Plan: PT plan of care cert/re-cert  Right knee pain - Plan: PT plan of care cert/re-cert  Decreased range of motion (ROM) of knee - Plan: PT plan of care cert/re-cert  Abnormality of gait - Plan: PT plan of care cert/re-cert  Proximal muscle weakness - Plan: PT plan of care cert/re-cert      Subjective Assessment - 04/13/14 1017    Subjective R  knee usually hurts but has a burning sensation inside; will get some swelling and does become tight through the day. Lots of scar tissue on outside of knee.    Pertinent History Patient states that he has had a large amount of knee surgeries in the past, including multiple surgeries to repair cartilage and ligaments. Patient states that his right knee had become completely worn out with meniscus tears all the way around, very painful and eventually required replacement.  States his L knee replacement took a long time to fully recover due to complications with infection and that he never really got full range back in his L knee.    How long can you sit comfortably? 15-20 minutes   How long can you stand comfortably? 5-10 minutes   How long can you walk comfortably? Sometimes its right away; other times patient can walk up and down his driveway without an issue    Currently in Pain? Yes   Pain Score 5    Pain Location  Knee   Pain Orientation Right            [facility] PT Assessment - 04/13/14 0001    Assessment   Medical Diagnosis R Total knee replacement    Onset Date 03/23/14   Next MD Visit May 4th    Precautions   Precautions None   Precaution Comments WBAT   Balance Screen   Has the patient fallen in the past 6 months Yes   How many times? 1  sleeping and fell out of bed    Has the patient had a decrease in activity level because of a fear of falling?  Yes   Is the patient reluctant to leave their home because of a fear of falling?  No   Prior Function   Level of Independence Independent with basic ADLs;Independent with homemaking with ambulation;Independent with gait;Independent with transfers   Vocation Retired   Leisure [employer], water tubing    Observation/Other Assessments   Observations Examined incision; proximal wound appears to have a small scab/eschar at the top as wel as alternating areas of closed tissues and open wounds that remain. Open area with serous drainage midwound and lower wound. Patient arrived with bandage over wound. No odor noted at this time.    Focus on Therapeutic Outcomes (FOTO)  61% limited    AROM   Right Hip External Rotation  32   Right Hip Internal Rotation  33   Left Hip External Rotation  43   Left Hip Internal Rotation  30   Right Knee Extension 14   Right Knee Flexion 80   Strength   Right Hip Flexion 3/5   Right Hip Extension 3/5   Right Hip ABduction 3/5   Left Hip Flexion 3+/5   Left Hip Extension 3/5   Left Hip ABduction 4+/5   Right Knee Flexion 4+/5   Right Knee Extension 4+/5   Left Knee Flexion 5/5   Left Knee Extension 5/5   Right Ankle Dorsiflexion 5/5   Left Ankle Dorsiflexion 5/5   Flexibility   Hamstrings bilateral tightness    Ambulation/Gait   Gait Comments rigid trunk and hips, very limited hip IR, flexed trunk, reduced TKE, reduced gait speed, reduced ankle DF, reduced pelvic rotation during gait                             PT Education - 04/13/14 1021  Education provided Yes   Education Details Prognois with skilled PT services; overall PT plan of care; HEP; importance of monitoring incision/wounds on R knee for signs of infection and poor healing   Person(s) Educated Patient   Methods Explanation;Demonstration;Handout   Comprehension Verbalized understanding;Returned demonstration          PT Short Term Goals - 04/13/14 1032    PT SHORT TERM GOAL #1   Title Patient will demonstrate 8 degrees extension R knee and at least 95 degrees flexion R knee   Time 2   Period Weeks   Status New   PT SHORT TERM GOAL #2   Title Patient will demonstrate at least 4/5 strength in all proximal muscles and in R leg in order to increase overall strength and stability    Time 2   Period Weeks   Status New   PT SHORT TERM GOAL #3   Title Patient will demonstrate the ability to stand for at least 45 minutes with pain no more than 2/10 pain and will be able to ambulate with cane at least 20 minutes and pain no more than 2/10   Time 2   Period Weeks   Status New   PT SHORT TERM GOAL #4   Title Patient will demonstrate bilateral hip IR of at least 38 degrees in order to reduce stress on bilateral knees and improve overall functional mechanics    Time 2   Period Weeks   Status New           PT Long Term Goals - 04/13/14 1041    PT LONG TERM GOAL #1   Title Patient will consistently and correctly be able to perform advanced HEP on an independent basis    Time 4   Period Weeks   Status New   PT LONG TERM GOAL #2   Title Patient will be able to sit and stand for unlimited amounts of time with no assistive device, and will be able to walk unlimited distances without assistive device and pain no more than 1/10 R knee    Time 4   Period Weeks   Status New   PT LONG TERM GOAL #3   Title Patient will report that he has been able to return his fitness routine without  difficulty or increased pain in R knee    Time 4   Period Weeks   Status New   PT LONG TERM GOAL #4   Title Patient will demonstrate no less than 3 degrees extension and 115 degrees flexion R knee to enhance ability to participate in functional tasks    Time 4   Period Weeks   Status New               Plan - 04/13/14 1026    Clinical Impression Statement Patient presents with R knee pain, possible signs of impaired healing in incision R knee, reduced proximal muscle and R leg strength, significant gait and stair climbing  impairments, and overall reduced ability to participate in functional tasks at this time. Patient has a long history of requiring knee surgeries and had extended period of recovery for the knee replacement he had on his left leg (approximately 2 years) due to complications with infection. At this time the incision on his R knee appears to possibly have impaired healing and will require close monitoring by therapy staff for signs of infection; therapy staff may also attempt to obtain wound care referral  from MD if necessary  if wound continues to display difficulty in healing correctly- TBD.  Patient will benefit from skilled PT services in order to address these impairments as well as to assist him in reaching an optimal level of function.    Pt will benefit from skilled therapeutic intervention in order to improve on the following deficits Abnormal gait;Decreased coordination;Decreased range of motion;Difficulty walking;Impaired flexibility;Improper body mechanics;Postural dysfunction;Increased edema;Decreased endurance;Decreased activity tolerance;Decreased skin integrity;Decreased balance;Decreased scar mobility;Pain;Decreased mobility;Decreased strength   Rehab Potential Good   PT Frequency 3x / week   PT Duration 4 weeks   PT Treatment/Interventions ADLs/Self Care Home Management;Gait training;Neuromuscular re-education;Stair training;Scar mobilization;Functional  mobility training;Patient/family education;Cryotherapy;Therapeutic activities;Therapeutic exercise;Manual techniques;Moist Heat;Balance training   PT Next Visit Plan Focus on increasing ROM in both extension and flexion over strengthening; extend functional stretching due to overall muscle tightness    PT Home Exercise Plan bridges, sidelying hip ABD, forward knee drives on stairs, hamstring stretch on stairs    Consulted and Agree with Plan of Care Patient          G-Codes - May 01, 2014 1054    Functional Assessment Tool Used FOTO    Functional Limitation Mobility: Walking and moving around   Mobility: Walking and Moving Around Current Status [phone]) At least 60 percent but less than 80 percent impaired, limited or restricted   Mobility: Walking and Moving Around Goal Status [phone]) At least 40 percent but less than 60 percent impaired, limited or restricted       Problem List Patient Active Problem List   Diagnosis Date Noted  . S/P right TKA 03/23/2014  . S/P knee replacement 03/23/2014  . Spinal stenosis of cervicothoracic region 10/08/2013  . RLS (restless legs syndrome) 10/08/2013  . DM (diabetes mellitus) type 2, uncontrolled, with ketoacidosis 10/08/2013  . Preoperative cardiovascular examination 08/14/2013  . Sleep apnea with use of continuous positive airway pressure (CPAP) 04/02/2013  . 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/02/2013  . Diabetic neuropathy 07/23/2012  . Obesity, morbid 05/01/2012  . Hypersomnia, persistent 05/01/2012  . OSA on CPAP 04/19/2012  . Essential hypertension, benign 12/16/2009  . CORONARY ATHEROSCLEROSIS NATIVE CORONARY ARTERY 12/16/2009  . Type 2 diabetes mellitus, uncontrolled 11/30/2009  . HYPERLIPIDEMIA 11/30/2009    Deniece Ree PT, DPT [phone]  [facility] [address] [place], [place], [address] Phone: [phone]   Fax:  [phone]

## 2014-04-14 ENCOUNTER — Other Ambulatory Visit: Payer: Self-pay | Admitting: Neurology

## 2014-04-14 ENCOUNTER — Other Ambulatory Visit: Payer: Self-pay | Admitting: Cardiology

## 2014-04-14 ENCOUNTER — Other Ambulatory Visit: Payer: Self-pay | Admitting: Adult Health

## 2014-04-14 NOTE — Telephone Encounter (Signed)
Rx signed and faxed.

## 2014-04-15 ENCOUNTER — Ambulatory Visit (HOSPITAL_COMMUNITY): Payer: Medicare Other | Admitting: Physical Therapy

## 2014-04-15 DIAGNOSIS — Z96651 Presence of right artificial knee joint: Secondary | ICD-10-CM

## 2014-04-15 DIAGNOSIS — Z471 Aftercare following joint replacement surgery: Secondary | ICD-10-CM | POA: Diagnosis not present

## 2014-04-15 DIAGNOSIS — M6281 Muscle weakness (generalized): Secondary | ICD-10-CM

## 2014-04-15 DIAGNOSIS — M25561 Pain in right knee: Secondary | ICD-10-CM | POA: Diagnosis not present

## 2014-04-15 DIAGNOSIS — M25669 Stiffness of unspecified knee, not elsewhere classified: Secondary | ICD-10-CM

## 2014-04-15 DIAGNOSIS — R269 Unspecified abnormalities of gait and mobility: Secondary | ICD-10-CM

## 2014-04-15 DIAGNOSIS — M25661 Stiffness of right knee, not elsewhere classified: Secondary | ICD-10-CM | POA: Diagnosis not present

## 2014-04-15 NOTE — Therapy (Signed)
[place], [place], [address] Phone: [phone]   Fax:  [phone]  Physical Therapy Treatment  Patient Details  Name: Paul Baker MRN: [id number] Date of Birth: 05/26/1950 Referring Provider:  Paralee Cancel, MD  Encounter Date: 04/15/2014      PT End of Session - 04/15/14 1040    Visit Number 2   Number of Visits 12   Date for PT Re-Evaluation 05/11/14   Authorization Type Medicare/UHC   Authorization Time Period 04/13/14 to 06/13/14   Authorization - Visit Number 2   Authorization - Number of Visits 10   PT Start Time 0800   PT Stop Time 0846   PT Time Calculation (min) 46 min   Activity Tolerance Patient tolerated treatment well   Behavior During Therapy [facility] for tasks assessed/performed      Past Medical History  Diagnosis Date  . Arthritis   . Type 2 diabetes mellitus   . Mixed hyperlipidemia   . Essential hypertension, benign   . Septic arthritis of knee, left   . OSA (obstructive sleep apnea)   . Coronary atherosclerosis of native coronary artery     Diseased nondominant RCA  . Pulmonary embolism 2004  . DVT (deep venous thrombosis) 2005    RT ARM / SHOULDER   . Rotator cuff disorder     LEFT  . Colon polyp   . Morbid obesity   . RLS (restless legs syndrome)   . Hypersomnia     CPAP of 16 cm, diagnosed with AHI of 60 in 2012,epworth 21- narcolepsy?  [name] MRSA (methicillin resistant staph aureus) culture positive     08/2012  . Narcolepsy   . Complication of anesthesia 1991    DIFFICULTY WAKING UP "TEMP DROPPED" - NO PROBLEM SINCE WITH ANESTHESIA  . Asthma     NO RECENT PROBLEMS  . Headache   . Abnormal movement of lower extremity     ALSO UPPER EXTREMITIES - FOLLOWED BY DR. DOLMEYER  . History of nonmelanoma skin cancer   . Psoriasis   . History of transfusion   . PE (pulmonary embolism) 1982    AFTER KNEE ARTHROTOMY  . Hypothyroidism   . Cancer     HX SKION CANCER     Past Surgical History  Procedure Laterality Date  . Total knee arthroplasty  2003    LEFT  . Lumbar disc surgery      Left L3, L4, L5 discecotomy with decompression of L4 root  . Wrist surgery    . Total knee revision  2005    LEFT  . Colonoscopy    . Back surgery    . Knee surgery      X 25 TIMES  . Total knee arthroplasty Right 03/23/2014    Procedure: RIGHT TOTAL KNEE ARTHROPLASTY AND REMOVAL RIGHT TIBIAL  DEEP IMPLANT STAPLE;  Surgeon: Paralee Cancel, MD;  Location: [facility];  Service: Orthopedics;  Laterality: Right;    There were no vitals filed for this visit.  Visit Diagnosis:  Status post right knee replacement  Right knee pain  Decreased range of motion (ROM) of knee  Abnormality of gait  Proximal muscle weakness      Subjective Assessment - 04/15/14 1031    Subjective Pt reports knee stiffness and burning.  States his incision busted open when he was bending his knee yesterday.  Reports serous drainage; had a nurse friend look at it and suggested he contacted MD office  to extend his antibiotic.     Currently in Pain? Yes   Pain Score 5    Pain Location Knee   Pain Orientation Right                       [facility] Adult PT Treatment/Exercise - 04/15/14 0818    Knee/Hip Exercises: Stretches   Active Hamstring Stretch 3 reps;30 seconds   Knee: Self-Stretch to increase Flexion 10 seconds   Knee: Self-Stretch Limitations 10 reps onto     Gastroc Stretch 3 reps;30 seconds   Knee/Hip Exercises: Aerobic   Stationary Bike 6 minutes rocking seat 10   Knee/Hip Exercises: Standing   Knee Flexion Right;10 reps   Knee Flexion Limitations TKF with 6" box   Forward Lunges Both;10 reps   Forward Lunges Limitations onto 6" step   Functional Squat 10 reps   Knee/Hip Exercises: Supine   Quad Sets Right;5 reps   Manual Therapy   Manual Therapy Myofascial release   Myofascial Release medial and lateral Rt knee                  PT Short Term  Goals - 04/13/14 1032    PT SHORT TERM GOAL #1   Title Patient will demonstrate 8 degrees extension R knee and at least 95 degrees flexion R knee   Time 2   Period Weeks   Status New   PT SHORT TERM GOAL #2   Title Patient will demonstrate at least 4/5 strength in all proximal muscles and in R leg in order to increase overall strength and stability    Time 2   Period Weeks   Status New   PT SHORT TERM GOAL #3   Title Patient will demonstrate the ability to stand for at least 45 minutes with pain no more than 2/10 pain and will be able to ambulate with cane at least 20 minutes and pain no more than 2/10   Time 2   Period Weeks   Status New   PT SHORT TERM GOAL #4   Title Patient will demonstrate bilateral hip IR of at least 38 degrees in order to reduce stress on bilateral knees and improve overall functional mechanics    Time 2   Period Weeks   Status New           PT Long Term Goals - 04/13/14 1041    PT LONG TERM GOAL #1   Title Patient will consistently and correctly be able to perform advanced HEP on an independent basis    Time 4   Period Weeks   Status New   PT LONG TERM GOAL #2   Title Patient will be able to sit and stand for unlimited amounts of time with no assistive device, and will be able to walk unlimited distances without assistive device and pain no more than 1/10 R knee    Time 4   Period Weeks   Status New   PT LONG TERM GOAL #3   Title Patient will report that he has been able to return his fitness routine without difficulty or increased pain in R knee    Time 4   Period Weeks   Status New   PT LONG TERM GOAL #4   Title Patient will demonstrate no less than 3 degrees extension and 115 degrees flexion R knee to enhance ability to participate in functional tasks    Time 4   Period Weeks  Status New               Plan - 04/15/14 1041    Clinical Impression Statement Progressed exercises per PT POC.   Pt without c/o pain or difficulty with  therex, however required cues from therapist to complete in correct form.  Added manual therapy with noted adhesions perimeter of knee lateral>medial.  Inspected incision with noted central openening with scant draiange.  No signs/symptoms of infection.  Pt instructed to keep area covered and call MD regarding antibiotic.     Rehab Potential --   PT Next Visit Plan Focus on increasing ROM in both extension and flexion over strengthening; extend functional stretching due to overall muscle tightness.  Give patient copy of evaluation and review goals next visit.  Follow up on antibiotic and check incision.        Problem List Patient Active Problem List   Diagnosis Date Noted  . S/P right TKA 03/23/2014  . S/P knee replacement 03/23/2014  . Spinal stenosis of cervicothoracic region 10/08/2013  . RLS (restless legs syndrome) 10/08/2013  . DM (diabetes mellitus) type 2, uncontrolled, with ketoacidosis 10/08/2013  . Preoperative cardiovascular examination 08/14/2013  . Sleep apnea with use of continuous positive airway pressure (CPAP) 04/02/2013  . 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/02/2013  . Diabetic neuropathy 07/23/2012  . Obesity, morbid 05/01/2012  . Hypersomnia, persistent 05/01/2012  . OSA on CPAP 04/19/2012  . Essential hypertension, benign 12/16/2009  . CORONARY ATHEROSCLEROSIS NATIVE CORONARY ARTERY 12/16/2009  . Type 2 diabetes mellitus, uncontrolled 11/30/2009  . HYPERLIPIDEMIA 11/30/2009    Teena Irani, PTA/CLT [phone] 04/15/2014, 11:06 AM  [place], [place], [address] Phone: [phone]   Fax:  [phone]

## 2014-04-17 ENCOUNTER — Ambulatory Visit (HOSPITAL_COMMUNITY): Payer: Medicare Other | Admitting: Physical Therapy

## 2014-04-17 ENCOUNTER — Telehealth: Payer: Self-pay | Admitting: Nutrition

## 2014-04-17 DIAGNOSIS — M25661 Stiffness of right knee, not elsewhere classified: Secondary | ICD-10-CM | POA: Diagnosis not present

## 2014-04-17 DIAGNOSIS — M25669 Stiffness of unspecified knee, not elsewhere classified: Secondary | ICD-10-CM

## 2014-04-17 DIAGNOSIS — Z96651 Presence of right artificial knee joint: Secondary | ICD-10-CM

## 2014-04-17 DIAGNOSIS — R269 Unspecified abnormalities of gait and mobility: Secondary | ICD-10-CM

## 2014-04-17 DIAGNOSIS — E291 Testicular hypofunction: Secondary | ICD-10-CM | POA: Diagnosis not present

## 2014-04-17 DIAGNOSIS — M6281 Muscle weakness (generalized): Secondary | ICD-10-CM

## 2014-04-17 DIAGNOSIS — Z471 Aftercare following joint replacement surgery: Secondary | ICD-10-CM | POA: Diagnosis not present

## 2014-04-17 DIAGNOSIS — M25561 Pain in right knee: Secondary | ICD-10-CM | POA: Diagnosis not present

## 2014-04-17 NOTE — Therapy (Signed)
[place], [place], [address] Phone: [phone]   Fax:  [phone]  Physical Therapy Treatment  Patient Details  Name: Paul Baker MRN: [id number] Date of Birth: [birth year] Referring Provider:  Paralee Cancel, MD  Encounter Date: 04/17/2014      PT End of Session - 04/17/14 1333    Visit Number 3   Number of Visits 12   Date for PT Re-Evaluation 05/11/14   Authorization Type Medicare/UHC   Authorization Time Period 04/13/14 to 06/13/14   Authorization - Visit Number 3   Authorization - Number of Visits 10   PT Start Time 1300   PT Stop Time 1350   PT Time Calculation (min) 50 min      Past Medical History  Diagnosis Date  . Arthritis   . Type 2 diabetes mellitus   . Mixed hyperlipidemia   . Essential hypertension, benign   . Septic arthritis of knee, left   . OSA (obstructive sleep apnea)   . Coronary atherosclerosis of native coronary artery     Diseased nondominant RCA  . Pulmonary embolism 2004  . DVT (deep venous thrombosis) 2005    RT ARM / SHOULDER   . Rotator cuff disorder     LEFT  . Colon polyp   . Morbid obesity   . RLS (restless legs syndrome)   . Hypersomnia     CPAP of 16 cm, diagnosed with AHI of 60 in 2012,epworth 21- narcolepsy?  [name] MRSA (methicillin resistant staph aureus) culture positive     08/2012  . Narcolepsy   . Complication of anesthesia 1991    DIFFICULTY WAKING UP "TEMP DROPPED" - NO PROBLEM SINCE WITH ANESTHESIA  . Asthma     NO RECENT PROBLEMS  . Headache   . Abnormal movement of lower extremity     ALSO UPPER EXTREMITIES - FOLLOWED BY DR. DOLMEYER  . History of nonmelanoma skin cancer   . Psoriasis   . History of transfusion   . PE (pulmonary embolism) 1982    AFTER KNEE ARTHROTOMY  . Hypothyroidism   . Cancer     HX SKION CANCER    Past Surgical History  Procedure Laterality Date  . Total knee arthroplasty  2003    LEFT  . Lumbar disc surgery       Left L3, L4, L5 discecotomy with decompression of L4 root  . Wrist surgery    . Total knee revision  2005    LEFT  . Colonoscopy    . Back surgery    . Knee surgery      X 25 TIMES  . Total knee arthroplasty Right 03/23/2014    Procedure: RIGHT TOTAL KNEE ARTHROPLASTY AND REMOVAL RIGHT TIBIAL  DEEP IMPLANT STAPLE;  Surgeon: Paralee Cancel, MD;  Location: [facility];  Service: Orthopedics;  Laterality: Right;    There were no vitals filed for this visit.  Visit Diagnosis:  Status post right knee replacement  Right knee pain  Decreased range of motion (ROM) of knee  Abnormality of gait  Proximal muscle weakness      Subjective Assessment - 04/17/14 1300    Subjective Pt states that he has been doing his exercises.  He has been trying to walk without his cane    Currently in Pain? Yes   Pain Score 4    Pain Location Knee   Pain Orientation Right;Anterior   Pain Descriptors / Indicators Aching;Burning  [place] Adult PT Treatment/Exercise - 04/17/14 0001    Knee/Hip Exercises: Stretches   Active Hamstring Stretch 3 reps;30 seconds   Active Hamstring Stretch Limitations supine    Hip Flexor Stretch 1 rep;60 seconds   Hip Flexor Stretch Limitations supine with Lt knee to chest    Gastroc Stretch 3 reps;30 seconds   Gastroc Stretch Limitations using step    Knee/Hip Exercises: Standing   Heel Raises 10 reps   Knee Flexion Right;15 reps   Terminal Knee Extension Right;10 reps   Lateral Step Up Right;10 reps   Forward Step Up 10 reps   Functional Squat 10 reps   Knee/Hip Exercises: Supine   Quad Sets 10 reps   Heel Slides Right;10 reps   Terminal Knee Extension 10 reps   Knee Extension PROM   Knee Flexion PROM   Knee/Hip Exercises: Prone   Hamstring Curl 10 reps                PT Education - 04/17/14 1336    Education provided Yes   Education Details increasing plantarflexion with gait    Person(s) Educated Patient    Methods Explanation;Demonstration   Comprehension Verbalized understanding          PT Short Term Goals - 04/13/14 1032    PT SHORT TERM GOAL #1   Title Patient will demonstrate 8 degrees extension R knee and at least 95 degrees flexion R knee   Time 2   Period Weeks   Status New   PT SHORT TERM GOAL #2   Title Patient will demonstrate at least 4/5 strength in all proximal muscles and in R leg in order to increase overall strength and stability    Time 2   Period Weeks   Status New   PT SHORT TERM GOAL #3   Title Patient will demonstrate the ability to stand for at least 45 minutes with pain no more than 2/10 pain and will be able to ambulate with cane at least 20 minutes and pain no more than 2/10   Time 2   Period Weeks   Status New   PT SHORT TERM GOAL #4   Title Patient will demonstrate bilateral hip IR of at least 38 degrees in order to reduce stress on bilateral knees and improve overall functional mechanics    Time 2   Period Weeks   Status New           PT Long Term Goals - 04/13/14 1041    PT LONG TERM GOAL #1   Title Patient will consistently and correctly be able to perform advanced HEP on an independent basis    Time 4   Period Weeks   Status New   PT LONG TERM GOAL #2   Title Patient will be able to sit and stand for unlimited amounts of time with no assistive device, and will be able to walk unlimited distances without assistive device and pain no more than 1/10 R knee    Time 4   Period Weeks   Status New   PT LONG TERM GOAL #3   Title Patient will report that he has been able to return his fitness routine without difficulty or increased pain in R knee    Time 4   Period Weeks   Status New   PT LONG TERM GOAL #4   Title Patient will demonstrate no less than 3 degrees extension and 115 degrees flexion R knee to enhance ability to participate  in functional tasks    Time 4   Period Weeks   Status New               Plan - 04/17/14 1334     Clinical Impression Statement Pt given initial evaluation and went over goals.  Pt exhbiits decreased terminal extension strength.  Pt instructed in proper gait mechanics as pt has significant decreased plantar flextion during gt.  Added step ups, heel raises and PROM to program as well as gt training.    PT Next Visit Plan begin SLS for balance         Problem List Patient Active Problem List   Diagnosis Date Noted  . S/P right TKA 03/23/2014  . S/P knee replacement 03/23/2014  . Spinal stenosis of cervicothoracic region 10/08/2013  . RLS (restless legs syndrome) 10/08/2013  . DM (diabetes mellitus) type 2, uncontrolled, with ketoacidosis 10/08/2013  . Preoperative cardiovascular examination 08/14/2013  . Sleep apnea with use of continuous positive airway pressure (CPAP) 04/02/2013  . 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/02/2013  . Diabetic neuropathy 07/23/2012  . Obesity, morbid 05/01/2012  . Hypersomnia, persistent 05/01/2012  . OSA on CPAP 04/19/2012  . Essential hypertension, benign 12/16/2009  . CORONARY ATHEROSCLEROSIS NATIVE CORONARY ARTERY 12/16/2009  . Type 2 diabetes mellitus, uncontrolled 11/30/2009  . HYPERLIPIDEMIA 11/30/2009  Rayetta Humphrey, PT CLT [phone] 04/17/2014, 1:45 PM  [place] [address] [place], [place], [address] Phone: [phone]   Fax:  [phone]

## 2014-04-20 ENCOUNTER — Ambulatory Visit (HOSPITAL_COMMUNITY): Payer: Medicare Other

## 2014-04-20 DIAGNOSIS — M6281 Muscle weakness (generalized): Secondary | ICD-10-CM

## 2014-04-20 DIAGNOSIS — Z96651 Presence of right artificial knee joint: Secondary | ICD-10-CM | POA: Diagnosis not present

## 2014-04-20 DIAGNOSIS — Z471 Aftercare following joint replacement surgery: Secondary | ICD-10-CM | POA: Diagnosis not present

## 2014-04-20 DIAGNOSIS — M25661 Stiffness of right knee, not elsewhere classified: Secondary | ICD-10-CM | POA: Diagnosis not present

## 2014-04-20 DIAGNOSIS — M25669 Stiffness of unspecified knee, not elsewhere classified: Secondary | ICD-10-CM

## 2014-04-20 DIAGNOSIS — M25561 Pain in right knee: Secondary | ICD-10-CM

## 2014-04-20 DIAGNOSIS — R269 Unspecified abnormalities of gait and mobility: Secondary | ICD-10-CM

## 2014-04-20 NOTE — Therapy (Signed)
[place], [place], [address] Phone: [phone]   Fax:  [phone]  Physical Therapy Treatment  Patient Details  Name: Paul Baker MRN: [id number] Date of Birth: [birth year] Referring Provider:  Paralee Cancel, MD  Encounter Date: 04/20/2014      PT End of Session - 04/20/14 1109    Date for PT Re-Evaluation 05/11/14   Authorization Type Medicare/UHC   Authorization Time Period 04/13/14 to 06/13/14   Authorization - Visit Number 4   Authorization - Number of Visits 10   PT Start Time 2376   PT Stop Time 1105   PT Time Calculation (min) 47 min   Activity Tolerance Patient tolerated treatment well   Behavior During Therapy [facility] for tasks assessed/performed      Past Medical History  Diagnosis Date  . Arthritis   . Type 2 diabetes mellitus   . Mixed hyperlipidemia   . Essential hypertension, benign   . Septic arthritis of knee, left   . OSA (obstructive sleep apnea)   . Coronary atherosclerosis of native coronary artery     Diseased nondominant RCA  . Pulmonary embolism 2004  . DVT (deep venous thrombosis) 2005    RT ARM / SHOULDER   . Rotator cuff disorder     LEFT  . Colon polyp   . Morbid obesity   . RLS (restless legs syndrome)   . Hypersomnia     CPAP of 16 cm, diagnosed with AHI of 60 in 2012,epworth 21- narcolepsy?  [name] MRSA (methicillin resistant staph aureus) culture positive     08/2012  . Narcolepsy   . Complication of anesthesia 1991    DIFFICULTY WAKING UP "TEMP DROPPED" - NO PROBLEM SINCE WITH ANESTHESIA  . Asthma     NO RECENT PROBLEMS  . Headache   . Abnormal movement of lower extremity     ALSO UPPER EXTREMITIES - FOLLOWED BY DR. DOLMEYER  . History of nonmelanoma skin cancer   . Psoriasis   . History of transfusion   . PE (pulmonary embolism) 1982    AFTER KNEE ARTHROTOMY  . Hypothyroidism   . Cancer     HX SKION CANCER    Past Surgical History  Procedure  Laterality Date  . Total knee arthroplasty  2003    LEFT  . Lumbar disc surgery      Left L3, L4, L5 discecotomy with decompression of L4 root  . Wrist surgery    . Total knee revision  2005    LEFT  . Colonoscopy    . Back surgery    . Knee surgery      X 25 TIMES  . Total knee arthroplasty Right 03/23/2014    Procedure: RIGHT TOTAL KNEE ARTHROPLASTY AND REMOVAL RIGHT TIBIAL  DEEP IMPLANT STAPLE;  Surgeon: Paralee Cancel, MD;  Location: [facility];  Service: Orthopedics;  Laterality: Right;    There were no vitals filed for this visit.  Visit Diagnosis:  Status post right knee replacement  Right knee pain  Decreased range of motion (ROM) of knee  Abnormality of gait  Proximal muscle weakness      Subjective Assessment - 04/20/14 1022    Subjective Pt stated he is learning how to walk again with the heel to toe, reports compliance with HEP and walking daily   Currently in Pain? Yes   Pain Score 2    Pain Location Knee   Pain Orientation Right;Anterior;Medial   Pain Descriptors /  Indicators Sharp;Aching            [facility] PT Assessment - 04/20/14 0001    Assessment   Medical Diagnosis R Total knee replacement    Onset Date 03/23/14   Next MD Visit May 4th    Precautions   Precautions None   Precaution Comments WBAT   AROM   Right Knee Extension 11   Right Knee Flexion 90   Left Knee Extension 93   Left Knee Flexion 6              [facility] Adult PT Treatment/Exercise - 04/20/14 0001    Exercises   Exercises Knee/Hip   Knee/Hip Exercises: Stretches   Active Hamstring Stretch 3 reps;30 seconds   Active Hamstring Stretch Limitations 3 directions on 14in step   Gastroc Stretch 3 reps;30 seconds   Gastroc Stretch Limitations slant board   Knee/Hip Exercises: Standing   Heel Raises 15 reps   Heel Raises Limitations toe raises 15reps   Forward Lunges Both;10 reps   Forward Lunges Limitations onto 6" step   Terminal Knee Extension Both;15 reps;Theraband    Theraband Level (Terminal Knee Extension) Level 4 (Blue)   Lateral Step Up Right;10 reps;Hand Hold: 1;Step Height: 4"   Forward Step Up Right;15 reps;Hand Hold: 1;Step Height: 6"   Functional Squat 15 reps   SLS Rt 49", Lt 42" max of 3   Gait Training 226 feet with cueing for heel to toe and to imprve UE movements    Knee/Hip Exercises: Supine   Short Arc Quad Sets Both;15 reps   Terminal Knee Extension Both;15 reps                PT Education - 04/20/14 1135    Education provided Yes   Education Details Scar tissue techniques   Person(s) Educated Patient   Methods Explanation;Demonstration   Comprehension Verbalized understanding          PT Short Term Goals - 04/20/14 1114    PT SHORT TERM GOAL #1   Title Patient will demonstrate 8 degrees extension R knee and at least 95 degrees flexion R knee   Baseline 04/20/2014 AROM 11-90 degrees   Status On-going   PT SHORT TERM GOAL #2   Title Patient will demonstrate at least 4/5 strength in all proximal muscles and in R leg in order to increase overall strength and stability    Status On-going   PT SHORT TERM GOAL #3   Title Patient will demonstrate the ability to stand for at least 45 minutes with pain no more than 2/10 pain and will be able to ambulate with cane at least 20 minutes and pain no more than 2/10   Status On-going   PT SHORT TERM GOAL #4   Title Patient will demonstrate bilateral hip IR of at least 38 degrees in order to reduce stress on bilateral knees and improve overall functional mechanics    Status On-going           PT Long Term Goals - 04/20/14 1114    PT LONG TERM GOAL #1   Title Patient will consistently and correctly be able to perform advanced HEP on an independent basis    PT LONG TERM GOAL #2   Title Patient will be able to sit and stand for unlimited amounts of time with no assistive device, and will be able to walk unlimited distances without assistive device and pain no more than 1/10 R  knee  PT LONG TERM GOAL #3   Title Patient will report that he has been able to return his fitness routine without difficulty or increased pain in R knee    PT LONG TERM GOAL #4   Title Patient will demonstrate no less than 3 degrees extension and 115 degrees flexion R knee to enhance ability to participate in functional tasks                Plan - 04/20/14 1130    Clinical Impression Statement Began toe raises to improve dorsiflexion during gait and cueing to improve UE sequencing to normalize gait mechancis.  Added SLS to improve balance and overall LE stability with min cueing to improve spatial awareness.  Increased to 6in with step ups for more normalized stair training.  AROM is improving BIl knees Rt 11-990 and Lt 6-93 degrees, continued with PROM both directions.     PT Next Visit Plan Continue with current PT POC to improve AROM, functional strengthening and balance.  Progress to vector stance next session for hip stability and balance.          Problem List Patient Active Problem List   Diagnosis Date Noted  . S/P right TKA 03/23/2014  . S/P knee replacement 03/23/2014  . Spinal stenosis of cervicothoracic region 10/08/2013  . RLS (restless legs syndrome) 10/08/2013  . DM (diabetes mellitus) type 2, uncontrolled, with ketoacidosis 10/08/2013  . Preoperative cardiovascular examination 08/14/2013  . Sleep apnea with use of continuous positive airway pressure (CPAP) 04/02/2013  . 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/02/2013  . Diabetic neuropathy 07/23/2012  . Obesity, morbid 05/01/2012  . Hypersomnia, persistent 05/01/2012  . OSA on CPAP 04/19/2012  . Essential hypertension, benign 12/16/2009  . CORONARY ATHEROSCLEROSIS NATIVE CORONARY ARTERY 12/16/2009  . Type 2 diabetes mellitus, uncontrolled 11/30/2009  . HYPERLIPIDEMIA 11/30/2009   Ihor Austin, Bier; [place] #[id number] [phone]  Aldona Lento 04/20/2014, 11:36 AM  [place], [place], [address] Phone: [phone]   Fax:  [phone]

## 2014-04-22 ENCOUNTER — Ambulatory Visit (HOSPITAL_COMMUNITY): Payer: Medicare Other | Admitting: Physical Therapy

## 2014-04-22 DIAGNOSIS — M25661 Stiffness of right knee, not elsewhere classified: Secondary | ICD-10-CM | POA: Diagnosis not present

## 2014-04-22 DIAGNOSIS — Z96651 Presence of right artificial knee joint: Secondary | ICD-10-CM | POA: Diagnosis not present

## 2014-04-22 DIAGNOSIS — M25561 Pain in right knee: Secondary | ICD-10-CM | POA: Diagnosis not present

## 2014-04-22 DIAGNOSIS — M25669 Stiffness of unspecified knee, not elsewhere classified: Secondary | ICD-10-CM

## 2014-04-22 DIAGNOSIS — M6281 Muscle weakness (generalized): Secondary | ICD-10-CM

## 2014-04-22 DIAGNOSIS — R269 Unspecified abnormalities of gait and mobility: Secondary | ICD-10-CM

## 2014-04-22 DIAGNOSIS — Z471 Aftercare following joint replacement surgery: Secondary | ICD-10-CM | POA: Diagnosis not present

## 2014-04-22 NOTE — Therapy (Signed)
[facility], [place], [address] Phone: [phone]   Fax:  [phone]  Physical Therapy Treatment  Patient Details  Name: Paul Baker MRN: [id number] Date of Birth: 12/26/1950 Referring Provider:  Paralee Cancel, MD  Encounter Date: 04/22/2014      PT End of Session - 04/22/14 1103    Visit Number 5   Number of Visits 12   Date for PT Re-Evaluation 05/11/14   Authorization Type Medicare/UHC   Authorization Time Period 04/13/14 to 06/13/14   Authorization - Visit Number 5   Authorization - Number of Visits 10   PT Start Time 1017   PT Stop Time 1100   PT Time Calculation (min) 43 min   Activity Tolerance Patient tolerated treatment well   Behavior During Therapy [facility] for tasks assessed/performed      Past Medical History  Diagnosis Date  . Arthritis   . Type 2 diabetes mellitus   . Mixed hyperlipidemia   . Essential hypertension, benign   . Septic arthritis of knee, left   . OSA (obstructive sleep apnea)   . Coronary atherosclerosis of native coronary artery     Diseased nondominant RCA  . Pulmonary embolism 2004  . DVT (deep venous thrombosis) 2005    RT ARM / SHOULDER   . Rotator cuff disorder     LEFT  . Colon polyp   . Morbid obesity   . RLS (restless legs syndrome)   . Hypersomnia     CPAP of 16 cm, diagnosed with AHI of 60 in 2012,epworth 21- narcolepsy?  [name] MRSA (methicillin resistant staph aureus) culture positive     08/2012  . Narcolepsy   . Complication of anesthesia 1991    DIFFICULTY WAKING UP "TEMP DROPPED" - NO PROBLEM SINCE WITH ANESTHESIA  . Asthma     NO RECENT PROBLEMS  . Headache   . Abnormal movement of lower extremity     ALSO UPPER EXTREMITIES - FOLLOWED BY DR. DOLMEYER  . History of nonmelanoma skin cancer   . Psoriasis   . History of transfusion   . PE (pulmonary embolism) 1982    AFTER KNEE ARTHROTOMY  . Hypothyroidism   . Cancer     HX SKION CANCER     Past Surgical History  Procedure Laterality Date  . Total knee arthroplasty  2003    LEFT  . Lumbar disc surgery      Left L3, L4, L5 discecotomy with decompression of L4 root  . Wrist surgery    . Total knee revision  2005    LEFT  . Colonoscopy    . Back surgery    . Knee surgery      X 25 TIMES  . Total knee arthroplasty Right 03/23/2014    Procedure: RIGHT TOTAL KNEE ARTHROPLASTY AND REMOVAL RIGHT TIBIAL  DEEP IMPLANT STAPLE;  Surgeon: Paralee Cancel, MD;  Location: [facility];  Service: Orthopedics;  Laterality: Right;    There were no vitals filed for this visit.  Visit Diagnosis:  Status post right knee replacement  Right knee pain  Decreased range of motion (ROM) of knee  Abnormality of gait  Proximal muscle weakness                       [facility] Adult PT Treatment/Exercise - 04/22/14 0001    Knee/Hip Exercises: Stretches   Active Hamstring Stretch 3 reps;30 seconds   Active Hamstring Stretch Limitations 14  inch step    Quad Stretch 3 reps;30 seconds   Quad Stretch Limitations prone with rope    Gastroc Stretch 3 reps;30 seconds   Gastroc Stretch Limitations slant board   Knee/Hip Exercises: Standing   Heel Raises 15 reps   Heel Raises Limitations toe raises    Forward Lunges Both;1 set;15 reps   Forward Lunges Limitations onto 6 inch box    Side Lunges Both;1 set;10 reps   Side Lunges Limitations onto 6 inch box    Terminal Knee Extension Both;1 set;15 reps   Theraband Level (Terminal Knee Extension) Level 4 (Blue)   Forward Step Up Both;1 set;15 reps   Forward Step Up Limitations 6 inch box    Step Down Both;1 set;10 reps   Step Down Limitations 4 inch box    Rocker Board Limitations x20 A-P, x20 lateral no HHA    Gait Training 268ft with focus on heel to toe gait pattern    Other Standing Knee Exercises Hip IR box walks 1x10 each side    Other Standing Knee Exercises Walking with cross-midilne reach and focus on spine/hip rotation                  PT Education - 04/22/14 1103    Education provided No          PT Short Term Goals - 04/20/14 1114    PT SHORT TERM GOAL #1   Title Patient will demonstrate 8 degrees extension R knee and at least 95 degrees flexion R knee   Baseline 04/20/2014 AROM 11-90 degrees   Status On-going   PT SHORT TERM GOAL #2   Title Patient will demonstrate at least 4/5 strength in all proximal muscles and in R leg in order to increase overall strength and stability    Status On-going   PT SHORT TERM GOAL #3   Title Patient will demonstrate the ability to stand for at least 45 minutes with pain no more than 2/10 pain and will be able to ambulate with cane at least 20 minutes and pain no more than 2/10   Status On-going   PT SHORT TERM GOAL #4   Title Patient will demonstrate bilateral hip IR of at least 38 degrees in order to reduce stress on bilateral knees and improve overall functional mechanics    Status On-going           PT Long Term Goals - 04/20/14 1114    PT LONG TERM GOAL #1   Title Patient will consistently and correctly be able to perform advanced HEP on an independent basis    PT LONG TERM GOAL #2   Title Patient will be able to sit and stand for unlimited amounts of time with no assistive device, and will be able to walk unlimited distances without assistive device and pain no more than 1/10 R knee    PT LONG TERM GOAL #3   Title Patient will report that he has been able to return his fitness routine without difficulty or increased pain in R knee    PT LONG TERM GOAL #4   Title Patient will demonstrate no less than 3 degrees extension and 115 degrees flexion R knee to enhance ability to participate in functional tasks                Plan - 04/22/14 1106    Clinical Impression Statement Continued functional exercise program with introduction of step downs, hip IR box walks, and cross-midline reaches  in order to mobilize hips to a greater extent.  Continues to focus on heel to toe gait but appears to be improving in gait pattern overall. Good tolerance of all exercises with minimal cues for form.    Pt will benefit from skilled therapeutic intervention in order to improve on the following deficits Abnormal gait;Decreased coordination;Decreased range of motion;Difficulty walking;Impaired flexibility;Improper body mechanics;Postural dysfunction;Increased edema;Decreased endurance;Decreased activity tolerance;Decreased skin integrity;Decreased balance;Decreased scar mobility;Pain;Decreased mobility;Decreased strength   Rehab Potential Good   PT Frequency 3x / week   PT Duration 4 weeks   PT Treatment/Interventions ADLs/Self Care Home Management;Gait training;Neuromuscular re-education;Stair training;Scar mobilization;Functional mobility training;Patient/family education;Cryotherapy;Therapeutic activities;Therapeutic exercise;Manual techniques;Moist Heat;Balance training   PT Next Visit Plan Continue with current PT POC to improve AROM, functional strengthening and balance.  Progress to vector stance next session for hip stability and balance.     PT Home Exercise Plan bridges, sidelying hip ABD, forward knee drives on stairs, hamstring stretch on stairs    Consulted and Agree with Plan of Care Patient        Problem List Patient Active Problem List   Diagnosis Date Noted  . S/P right TKA 03/23/2014  . S/P knee replacement 03/23/2014  . Spinal stenosis of cervicothoracic region 10/08/2013  . RLS (restless legs syndrome) 10/08/2013  . DM (diabetes mellitus) type 2, uncontrolled, with ketoacidosis 10/08/2013  . Preoperative cardiovascular examination 08/14/2013  . Sleep apnea with use of continuous positive airway pressure (CPAP) 04/02/2013  . 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/02/2013  . Diabetic neuropathy 07/23/2012  . Obesity, morbid 05/01/2012  . Hypersomnia, persistent 05/01/2012  . OSA on CPAP 04/19/2012  .  Essential hypertension, benign 12/16/2009  . CORONARY ATHEROSCLEROSIS NATIVE CORONARY ARTERY 12/16/2009  . Type 2 diabetes mellitus, uncontrolled 11/30/2009  . HYPERLIPIDEMIA 11/30/2009    Deniece Ree PT, DPT [phone]  [facility] [address] [place], [place], [address] Phone: [phone]   Fax:  [phone]

## 2014-04-22 NOTE — Telephone Encounter (Signed)
Opened in error. Paul Baker, RDN CDE

## 2014-04-28 DIAGNOSIS — I10 Essential (primary) hypertension: Secondary | ICD-10-CM | POA: Diagnosis not present

## 2014-04-28 DIAGNOSIS — E119 Type 2 diabetes mellitus without complications: Secondary | ICD-10-CM | POA: Diagnosis not present

## 2014-04-28 DIAGNOSIS — N189 Chronic kidney disease, unspecified: Secondary | ICD-10-CM | POA: Diagnosis not present

## 2014-04-29 ENCOUNTER — Ambulatory Visit (HOSPITAL_COMMUNITY): Payer: Medicare Other | Admitting: Physical Therapy

## 2014-04-29 DIAGNOSIS — R269 Unspecified abnormalities of gait and mobility: Secondary | ICD-10-CM

## 2014-04-29 DIAGNOSIS — M25669 Stiffness of unspecified knee, not elsewhere classified: Secondary | ICD-10-CM

## 2014-04-29 DIAGNOSIS — M25561 Pain in right knee: Secondary | ICD-10-CM

## 2014-04-29 DIAGNOSIS — M6281 Muscle weakness (generalized): Secondary | ICD-10-CM

## 2014-04-29 DIAGNOSIS — M25661 Stiffness of right knee, not elsewhere classified: Secondary | ICD-10-CM | POA: Diagnosis not present

## 2014-04-29 DIAGNOSIS — Z471 Aftercare following joint replacement surgery: Secondary | ICD-10-CM | POA: Diagnosis not present

## 2014-04-29 DIAGNOSIS — Z96651 Presence of right artificial knee joint: Secondary | ICD-10-CM | POA: Diagnosis not present

## 2014-04-29 NOTE — Therapy (Signed)
[place], [place], [address] Phone: [phone]   Fax:  [phone]  Physical Therapy Treatment  Patient Details  Name: Paul Baker MRN: [id number] Date of Birth: May 01, 1950 Referring Provider:  Paralee Cancel, MD  Encounter Date: 04/29/2014      PT End of Session - 04/29/14 1008    Visit Number 6   Number of Visits 12   Date for PT Re-Evaluation 05/11/14   Authorization Type Medicare/UHC   Authorization Time Period 04/13/14 to 06/13/14   Authorization - Visit Number 6   Authorization - Number of Visits 10   PT Start Time 0936   PT Stop Time 1015   PT Time Calculation (min) 39 min   Activity Tolerance Patient tolerated treatment well   Behavior During Therapy [facility] for tasks assessed/performed      Past Medical History  Diagnosis Date  . Arthritis   . Type 2 diabetes mellitus   . Mixed hyperlipidemia   . Essential hypertension, benign   . Septic arthritis of knee, left   . OSA (obstructive sleep apnea)   . Coronary atherosclerosis of native coronary artery     Diseased nondominant RCA  . Pulmonary embolism 2004  . DVT (deep venous thrombosis) 2005    RT ARM / SHOULDER   . Rotator cuff disorder     LEFT  . Colon polyp   . Morbid obesity   . RLS (restless legs syndrome)   . Hypersomnia     CPAP of 16 cm, diagnosed with AHI of 60 in 2012,epworth 21- narcolepsy?  [name] MRSA (methicillin resistant staph aureus) culture positive     08/2012  . Narcolepsy   . Complication of anesthesia 1991    DIFFICULTY WAKING UP "TEMP DROPPED" - NO PROBLEM SINCE WITH ANESTHESIA  . Asthma     NO RECENT PROBLEMS  . Headache   . Abnormal movement of lower extremity     ALSO UPPER EXTREMITIES - FOLLOWED BY DR. DOLMEYER  . History of nonmelanoma skin cancer   . Psoriasis   . History of transfusion   . PE (pulmonary embolism) 1982    AFTER KNEE ARTHROTOMY  . Hypothyroidism   . Cancer     HX SKION CANCER     Past Surgical History  Procedure Laterality Date  . Total knee arthroplasty  2003    LEFT  . Lumbar disc surgery      Left L3, L4, L5 discecotomy with decompression of L4 root  . Wrist surgery    . Total knee revision  2005    LEFT  . Colonoscopy    . Back surgery    . Knee surgery      X 25 TIMES  . Total knee arthroplasty Right 03/23/2014    Procedure: RIGHT TOTAL KNEE ARTHROPLASTY AND REMOVAL RIGHT TIBIAL  DEEP IMPLANT STAPLE;  Surgeon: Paralee Cancel, MD;  Location: [facility];  Service: Orthopedics;  Laterality: Right;    There were no vitals filed for this visit.  Visit Diagnosis:  Status post right knee replacement  Right knee pain  Decreased range of motion (ROM) of knee  Abnormality of gait  Proximal muscle weakness      Subjective Assessment - 04/29/14 0940    Subjective Patient reports he is doing well today but having 4/10 pain in his knee today; states that he is still having trouble with swelling and that the skin around his knee still feels tight  Pertinent History Patient states that he has had a large amount of knee surgeries in the past, including multiple surgeries to repair cartilage and ligaments. Patient states that his right knee had become completely worn out with meniscus tears all the way around, very painful and eventually required replacement.  States his L knee replacement took a long time to fully recover due to complications with infection and that he never really got full range back in his L knee.    Currently in Pain? Yes   Pain Score 4    Pain Location Knee   Pain Orientation Right;Anterior;Medial                         [facility] Adult PT Treatment/Exercise - 04/29/14 0001    Ambulation/Gait   Stairs Yes   Stairs Assistance 6: Modified independent (Device/Increase time)   Stair Management Technique One rail Left;Step to pattern;Alternating pattern   Number of Stairs 4   Height of Stairs 7   Gait Comments Circumduction of  B LEs with both step to and step over step pattern; use of U rail with step to, B rails with step over step. Noted significant weakness bilateral quads with step descent.    Knee/Hip Exercises: Stretches   Active Hamstring Stretch 3 reps;30 seconds   Active Hamstring Stretch Limitations 14 inch step   Quad Stretch 3 reps;30 seconds   Quad Stretch Limitations prone with rope    Piriformis Stretch 2 reps;30 seconds   Piriformis Stretch Limitations seated   Gastroc Stretch 3 reps;30 seconds   Gastroc Stretch Limitations slant board   Knee/Hip Exercises: Aerobic   Elliptical Level 1 for 10 minutes    Knee/Hip Exercises: Standing   Forward Lunges Both;1 set;10 reps   Forward Lunges Limitations 4 inch box    Side Lunges Both;1 set;10 reps   Side Lunges Limitations 4 inch box    Terminal Knee Extension Both;1 set;15 reps   Theraband Level (Terminal Knee Extension) Level 4 (Blue)   Terminal Knee Extension Limitations 2-3 second holds                 PT Education - 04/29/14 1007    Education provided Yes   Education Details Scar tissue management techniques, techniques to manage sensitivity of skin around incision site, parameters for use of elliptical at gym   Person(s) Educated Patient   Methods Explanation   Comprehension Verbalized understanding          PT Short Term Goals - 04/20/14 1114    PT SHORT TERM GOAL #1   Title Patient will demonstrate 8 degrees extension R knee and at least 95 degrees flexion R knee   Baseline 04/20/2014 AROM 11-90 degrees   Status On-going   PT SHORT TERM GOAL #2   Title Patient will demonstrate at least 4/5 strength in all proximal muscles and in R leg in order to increase overall strength and stability    Status On-going   PT SHORT TERM GOAL #3   Title Patient will demonstrate the ability to stand for at least 45 minutes with pain no more than 2/10 pain and will be able to ambulate with cane at least 20 minutes and pain no more than 2/10    Status On-going   PT SHORT TERM GOAL #4   Title Patient will demonstrate bilateral hip IR of at least 38 degrees in order to reduce stress on bilateral knees and improve overall functional mechanics  Status On-going           PT Long Term Goals - 04/20/14 1114    PT LONG TERM GOAL #1   Title Patient will consistently and correctly be able to perform advanced HEP on an independent basis    PT LONG TERM GOAL #2   Title Patient will be able to sit and stand for unlimited amounts of time with no assistive device, and will be able to walk unlimited distances without assistive device and pain no more than 1/10 R knee    PT LONG TERM GOAL #3   Title Patient will report that he has been able to return his fitness routine without difficulty or increased pain in R knee    PT LONG TERM GOAL #4   Title Patient will demonstrate no less than 3 degrees extension and 115 degrees flexion R knee to enhance ability to participate in functional tasks                Plan - 04/29/14 1009    Clinical Impression Statement Continued functional stretching and strengthening with progression of lunges, introduction of stair training and elliptical. Patient demonstrates circumduction and general muscle weakness, especially with eccentric quad strength, on stairs and continues to depend on railings for stair training. Introduced elliptical today with good tolerance and no increase in pain.    Pt will benefit from skilled therapeutic intervention in order to improve on the following deficits Abnormal gait;Decreased coordination;Decreased range of motion;Difficulty walking;Impaired flexibility;Improper body mechanics;Postural dysfunction;Increased edema;Decreased endurance;Decreased activity tolerance;Decreased skin integrity;Decreased balance;Decreased scar mobility;Pain;Decreased mobility;Decreased strength   Rehab Potential Good   PT Frequency 3x / week   PT Duration 4 weeks   PT Treatment/Interventions  ADLs/Self Care Home Management;Gait training;Neuromuscular re-education;Stair training;Scar mobilization;Functional mobility training;Patient/family education;Cryotherapy;Therapeutic activities;Therapeutic exercise;Manual techniques;Moist Heat;Balance training   PT Next Visit Plan Continue functional exercises and stretches, elliptical and bike, stair training, knee ROM exercises    PT Home Exercise Plan bridges, sidelying hip ABD, forward knee drives on stairs, hamstring stretch on stairs    Consulted and Agree with Plan of Care Patient        Problem List Patient Active Problem List   Diagnosis Date Noted  . S/P right TKA 03/23/2014  . S/P knee replacement 03/23/2014  . Spinal stenosis of cervicothoracic region 10/08/2013  . RLS (restless legs syndrome) 10/08/2013  . DM (diabetes mellitus) type 2, uncontrolled, with ketoacidosis 10/08/2013  . Preoperative cardiovascular examination 08/14/2013  . Sleep apnea with use of continuous positive airway pressure (CPAP) 04/02/2013  . 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/02/2013  . Diabetic neuropathy 07/23/2012  . Obesity, morbid 05/01/2012  . Hypersomnia, persistent 05/01/2012  . OSA on CPAP 04/19/2012  . Essential hypertension, benign 12/16/2009  . CORONARY ATHEROSCLEROSIS NATIVE CORONARY ARTERY 12/16/2009  . Type 2 diabetes mellitus, uncontrolled 11/30/2009  . HYPERLIPIDEMIA 11/30/2009    Deniece Ree PT, DPT [phone]  [facility] [address] [place], [place], [address] Phone: [phone]   Fax:  [phone]

## 2014-05-01 DIAGNOSIS — E291 Testicular hypofunction: Secondary | ICD-10-CM | POA: Diagnosis not present

## 2014-05-04 ENCOUNTER — Other Ambulatory Visit: Payer: Self-pay | Admitting: Family Medicine

## 2014-05-04 ENCOUNTER — Ambulatory Visit (HOSPITAL_COMMUNITY): Payer: Medicare Other | Attending: Orthopedic Surgery | Admitting: Physical Therapy

## 2014-05-04 DIAGNOSIS — M25661 Stiffness of right knee, not elsewhere classified: Secondary | ICD-10-CM | POA: Diagnosis not present

## 2014-05-04 DIAGNOSIS — Z96651 Presence of right artificial knee joint: Secondary | ICD-10-CM | POA: Insufficient documentation

## 2014-05-04 DIAGNOSIS — M25561 Pain in right knee: Secondary | ICD-10-CM

## 2014-05-04 DIAGNOSIS — Z471 Aftercare following joint replacement surgery: Secondary | ICD-10-CM | POA: Insufficient documentation

## 2014-05-04 DIAGNOSIS — M6281 Muscle weakness (generalized): Secondary | ICD-10-CM

## 2014-05-04 DIAGNOSIS — M25669 Stiffness of unspecified knee, not elsewhere classified: Secondary | ICD-10-CM

## 2014-05-04 DIAGNOSIS — R269 Unspecified abnormalities of gait and mobility: Secondary | ICD-10-CM

## 2014-05-04 NOTE — Therapy (Signed)
[place], [place], [address] Phone: [phone]   Fax:  [phone]  Physical Therapy Treatment  Patient Details  Name: Paul Baker MRN: [id number] Date of Birth: May 30, 1950 Referring Provider:  Paralee Cancel, MD  Encounter Date: 05/04/2014      PT End of Session - 05/04/14 0929    Visit Number 7   Number of Visits 12   Date for PT Re-Evaluation 05/11/14   Authorization Type Medicare/UHC   Authorization Time Period 04/13/14 to 06/13/14   Authorization - Visit Number 7   Authorization - Number of Visits 10   PT Start Time 4259   PT Stop Time 0930   PT Time Calculation (min) 35 min   Activity Tolerance Patient tolerated treatment well   Behavior During Therapy [facility] for tasks assessed/performed      Past Medical History  Diagnosis Date  . Arthritis   . Type 2 diabetes mellitus   . Mixed hyperlipidemia   . Essential hypertension, benign   . Septic arthritis of knee, left   . OSA (obstructive sleep apnea)   . Coronary atherosclerosis of native coronary artery     Diseased nondominant RCA  . Pulmonary embolism 2004  . DVT (deep venous thrombosis) 2005    RT ARM / SHOULDER   . Rotator cuff disorder     LEFT  . Colon polyp   . Morbid obesity   . RLS (restless legs syndrome)   . Hypersomnia     CPAP of 16 cm, diagnosed with AHI of 60 in 2012,epworth 21- narcolepsy?  [name] MRSA (methicillin resistant staph aureus) culture positive     08/2012  . Narcolepsy   . Complication of anesthesia 1991    DIFFICULTY WAKING UP "TEMP DROPPED" - NO PROBLEM SINCE WITH ANESTHESIA  . Asthma     NO RECENT PROBLEMS  . Headache   . Abnormal movement of lower extremity     ALSO UPPER EXTREMITIES - FOLLOWED BY DR. DOLMEYER  . History of nonmelanoma skin cancer   . Psoriasis   . History of transfusion   . PE (pulmonary embolism) 1982    AFTER KNEE ARTHROTOMY  . Hypothyroidism   . Cancer     HX SKION CANCER     Past Surgical History  Procedure Laterality Date  . Total knee arthroplasty  2003    LEFT  . Lumbar disc surgery      Left L3, L4, L5 discecotomy with decompression of L4 root  . Wrist surgery    . Total knee revision  2005    LEFT  . Colonoscopy    . Back surgery    . Knee surgery      X 25 TIMES  . Total knee arthroplasty Right 03/23/2014    Procedure: RIGHT TOTAL KNEE ARTHROPLASTY AND REMOVAL RIGHT TIBIAL  DEEP IMPLANT STAPLE;  Surgeon: Paralee Cancel, MD;  Location: [facility];  Service: Orthopedics;  Laterality: Right;    There were no vitals filed for this visit.  Visit Diagnosis:  Status post right knee replacement  Right knee pain  Decreased range of motion (ROM) of knee  Abnormality of gait  Proximal muscle weakness      Subjective Assessment - 05/04/14 0857    Subjective Patient reports he is doing OK, had a good weekend but reports that his knee started swelling more starting on Friday and has also noticed more redness and apparent swelling and tenderness around his incision site.  Pertinent History Patient states that he has had a large amount of knee surgeries in the past, including multiple surgeries to repair cartilage and ligaments. Patient states that his right knee had become completely worn out with meniscus tears all the way around, very painful and eventually required replacement.  States his L knee replacement took a long time to fully recover due to complications with infection and that he never really got full range back in his L knee.    Currently in Pain? Yes   Pain Score 4    Pain Location Knee   Pain Orientation Right                         [facility] Adult PT Treatment/Exercise - 05/04/14 0001    Ambulation/Gait   Ambulation/Gait Yes   Ambulation/Gait Assistance 7: Independent   Ambulation Distance (Feet) 400 Feet   Assistive device None   Gait Pattern Step-through pattern;Decreased step length - left;Decreased stance time -  right;Decreased dorsiflexion - right;Decreased dorsiflexion - left;Decreased weight shift to right;Decreased trunk rotation;Trunk flexed   Gait Comments Focus on heel-toe gait and gait with reciprocal arm swing    Knee/Hip Exercises: Stretches   Active Hamstring Stretch 3 reps;30 seconds   Active Hamstring Stretch Limitations 14 inch step   Quad Stretch 3 reps;30 seconds   Quad Stretch Limitations prone with rope    ITB Stretch 3 reps;30 seconds   ITB Stretch Limitations on wall    Piriformis Stretch 2 reps;30 seconds   Piriformis Stretch Limitations seated   Gastroc Stretch 3 reps;30 seconds   Gastroc Stretch Limitations slant board   Knee/Hip Exercises: Standing   Forward Lunges Both;1 set;10 reps   Forward Lunges Limitations 4 inch box    Side Lunges Both;1 set;10 reps   Side Lunges Limitations 4 inch box    Step Down Both;1 set;15 reps   Step Down Limitations attempted however had pain R knee today    Rocker Board Limitations x20 AP, x20 lateral no hands    Other Standing Knee Exercises Hip IR box walks 1x10 each side                 PT Education - 05/04/14 0928    Education provided Yes   Education Details ITB tightness and knee pain; instructed to keep an eye on R knee due to increased sensitivity, inflammation, edema around incision site- also instructed to report symptoms to MD since appointment coming up soon   Person(s) Educated Patient   Methods Explanation   Comprehension Verbalized understanding          PT Short Term Goals - 04/20/14 1114    PT SHORT TERM GOAL #1   Title Patient will demonstrate 8 degrees extension R knee and at least 95 degrees flexion R knee   Baseline 04/20/2014 AROM 11-90 degrees   Status On-going   PT SHORT TERM GOAL #2   Title Patient will demonstrate at least 4/5 strength in all proximal muscles and in R leg in order to increase overall strength and stability    Status On-going   PT SHORT TERM GOAL #3   Title Patient will  demonstrate the ability to stand for at least 45 minutes with pain no more than 2/10 pain and will be able to ambulate with cane at least 20 minutes and pain no more than 2/10   Status On-going   PT SHORT TERM GOAL #4   Title Patient will  demonstrate bilateral hip IR of at least 38 degrees in order to reduce stress on bilateral knees and improve overall functional mechanics    Status On-going           PT Long Term Goals - 04/20/14 1114    PT LONG TERM GOAL #1   Title Patient will consistently and correctly be able to perform advanced HEP on an independent basis    PT LONG TERM GOAL #2   Title Patient will be able to sit and stand for unlimited amounts of time with no assistive device, and will be able to walk unlimited distances without assistive device and pain no more than 1/10 R knee    PT LONG TERM GOAL #3   Title Patient will report that he has been able to return his fitness routine without difficulty or increased pain in R knee    PT LONG TERM GOAL #4   Title Patient will demonstrate no less than 3 degrees extension and 115 degrees flexion R knee to enhance ability to participate in functional tasks                Plan - 05/04/14 0929    Clinical Impression Statement Patient reported to session approximately 7 minutes late today. Expressed concerns regarding increased tenderness, apparent increased edema, and apparent increased inflammation that he has noticed around the incision site in his R knee since Friday; patient also reports that his knee has been swelling much more easily since Friday. Examined knee and did find some minor edema around incision as well as increased sensitivity to light touch; instructed patient to continue to monitor knee and report to MD or ER with any acute changes especially due to history of infection secondary to infection in other leg. Otherwise continued functional exercise program today with good tolerance by patient; some pain upon step downs  and discovered tightness in R ITB, which was addressed by functionl ITB stretching.    Pt will benefit from skilled therapeutic intervention in order to improve on the following deficits Abnormal gait;Decreased coordination;Decreased range of motion;Difficulty walking;Impaired flexibility;Improper body mechanics;Postural dysfunction;Increased edema;Decreased endurance;Decreased activity tolerance;Decreased skin integrity;Decreased balance;Decreased scar mobility;Pain;Decreased mobility;Decreased strength   Rehab Potential Good   PT Frequency 3x / week   PT Duration 4 weeks   PT Treatment/Interventions ADLs/Self Care Home Management;Gait training;Neuromuscular re-education;Stair training;Scar mobilization;Functional mobility training;Patient/family education;Cryotherapy;Therapeutic activities;Therapeutic exercise;Manual techniques;Moist Heat;Balance training   PT Next Visit Plan Continue functional exercises and stretches, elliptical and bike, stair training, knee ROM exercises. Check status of inflammation/edema in R knee.    PT Home Exercise Plan bridges, sidelying hip ABD, forward knee drives on stairs, hamstring stretch on stairs    Consulted and Agree with Plan of Care Patient        Problem List Patient Active Problem List   Diagnosis Date Noted  . S/P right TKA 03/23/2014  . S/P knee replacement 03/23/2014  . Spinal stenosis of cervicothoracic region 10/08/2013  . RLS (restless legs syndrome) 10/08/2013  . DM (diabetes mellitus) type 2, uncontrolled, with ketoacidosis 10/08/2013  . Preoperative cardiovascular examination 08/14/2013  . Sleep apnea with use of continuous positive airway pressure (CPAP) 04/02/2013  . 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/02/2013  . Diabetic neuropathy 07/23/2012  . Obesity, morbid 05/01/2012  . Hypersomnia, persistent 05/01/2012  . OSA on CPAP 04/19/2012  . Essential hypertension, benign 12/16/2009  . CORONARY ATHEROSCLEROSIS NATIVE  CORONARY ARTERY 12/16/2009  . Type 2 diabetes mellitus, uncontrolled 11/30/2009  . HYPERLIPIDEMIA  11/30/2009    Deniece Ree PT, DPT [id number]  [place] [address] [place], [place], [address] Phone: [phone]   Fax:  [phone]

## 2014-05-06 ENCOUNTER — Ambulatory Visit (HOSPITAL_COMMUNITY): Payer: Medicare Other

## 2014-05-06 ENCOUNTER — Telehealth (HOSPITAL_COMMUNITY): Payer: Self-pay

## 2014-05-06 DIAGNOSIS — Z96651 Presence of right artificial knee joint: Secondary | ICD-10-CM | POA: Diagnosis not present

## 2014-05-06 DIAGNOSIS — Z471 Aftercare following joint replacement surgery: Secondary | ICD-10-CM | POA: Diagnosis not present

## 2014-05-06 NOTE — Telephone Encounter (Signed)
Pt overslept and had to cx his appt

## 2014-05-08 ENCOUNTER — Ambulatory Visit (HOSPITAL_COMMUNITY): Payer: Medicare Other

## 2014-05-08 DIAGNOSIS — M25661 Stiffness of right knee, not elsewhere classified: Secondary | ICD-10-CM | POA: Diagnosis not present

## 2014-05-08 DIAGNOSIS — R269 Unspecified abnormalities of gait and mobility: Secondary | ICD-10-CM

## 2014-05-08 DIAGNOSIS — M25561 Pain in right knee: Secondary | ICD-10-CM

## 2014-05-08 DIAGNOSIS — Z96651 Presence of right artificial knee joint: Secondary | ICD-10-CM

## 2014-05-08 DIAGNOSIS — Z471 Aftercare following joint replacement surgery: Secondary | ICD-10-CM | POA: Diagnosis not present

## 2014-05-08 DIAGNOSIS — M6281 Muscle weakness (generalized): Secondary | ICD-10-CM

## 2014-05-08 DIAGNOSIS — M25669 Stiffness of unspecified knee, not elsewhere classified: Secondary | ICD-10-CM

## 2014-05-08 NOTE — Therapy (Signed)
[place], [place], [address] Phone: [phone]   Fax:  [phone]  Physical Therapy Treatment  Patient Details  Name: Paul Baker MRN: [id number] Date of Birth: 12/30/1950 Referring Provider:  [name], MD  Encounter Date: 05/08/2014      PT End of Session - 05/08/14 0904    Visit Number 8   Number of Visits 12   Date for PT Re-Evaluation 05/11/14   Authorization Type Medicare/UHC   Authorization Time Period 04/13/14 to 06/13/14   Authorization - Visit Number 8   Authorization - Number of Visits 10   PT Start Time 6286   PT Stop Time 0934   PT Time Calculation (min) 39 min   Activity Tolerance Patient tolerated treatment well   Behavior During Therapy [facility] for tasks assessed/performed      Past Medical History  Diagnosis Date  . Arthritis   . Type 2 diabetes mellitus   . Mixed hyperlipidemia   . Essential hypertension, benign   . Septic arthritis of knee, left   . OSA (obstructive sleep apnea)   . Coronary atherosclerosis of native coronary artery     Diseased nondominant RCA  . Pulmonary embolism 2004  . DVT (deep venous thrombosis) 2005    RT ARM / SHOULDER   . Rotator cuff disorder     LEFT  . Colon polyp   . Morbid obesity   . RLS (restless legs syndrome)   . Hypersomnia     CPAP of 16 cm, diagnosed with AHI of 60 in 2012,epworth 21- narcolepsy?  [name] MRSA (methicillin resistant staph aureus) culture positive     08/2012  . Narcolepsy   . Complication of anesthesia 1991    DIFFICULTY WAKING UP "TEMP DROPPED" - NO PROBLEM SINCE WITH ANESTHESIA  . Asthma     NO RECENT PROBLEMS  . Headache   . Abnormal movement of lower extremity     ALSO UPPER EXTREMITIES - FOLLOWED BY DR. DOLMEYER  . History of nonmelanoma skin cancer   . Psoriasis   . History of transfusion   . PE (pulmonary embolism) 1982    AFTER KNEE ARTHROTOMY  . Hypothyroidism   . Cancer     HX SKION CANCER     Past Surgical History  Procedure Laterality Date  . Total knee arthroplasty  2003    LEFT  . Lumbar disc surgery      Left L3, L4, L5 discecotomy with decompression of L4 root  . Wrist surgery    . Total knee revision  2005    LEFT  . Colonoscopy    . Back surgery    . Knee surgery      X 25 TIMES  . Total knee arthroplasty Right 03/23/2014    Procedure: RIGHT TOTAL KNEE ARTHROPLASTY AND REMOVAL RIGHT TIBIAL  DEEP IMPLANT STAPLE;  Surgeon: Paralee Cancel, MD;  Location: [facility];  Service: Orthopedics;  Laterality: Right;    There were no vitals filed for this visit.  Visit Diagnosis:  Status post right knee replacement  Right knee pain  Decreased range of motion (ROM) of knee  Abnormality of gait  Proximal muscle weakness      Subjective Assessment - 05/08/14 0901    Subjective Went to MD on Wednesday, doctor happy with knee progress.  Pt brought in script to continue PT for another 4 weeks.  Most difficulty currently with knee has been with swelling and ROM.  Currently in Pain? Yes   Pain Score 4    Pain Location Knee   Pain Orientation Right   Pain Descriptors / Indicators Sore;Burning;Aching                         [facility] Adult PT Treatment/Exercise - 05/08/14 0001    Ambulation/Gait   Ambulation/Gait Yes   Ambulation/Gait Assistance 7: Independent   Ambulation Distance (Feet) 400 Feet   Assistive device None   Gait Pattern Step-through pattern;Decreased step length - left;Decreased stance time - right;Decreased dorsiflexion - right;Decreased dorsiflexion - left;Decreased weight shift to right;Decreased trunk rotation;Trunk flexed   Gait Comments Focus on decreasing hip circumduction, heel to toe with reciprocal arm swing   Knee/Hip Exercises: Stretches   Active Hamstring Stretch 3 reps;30 seconds   Active Hamstring Stretch Limitations 14 inch step 3 directions   Quad Stretch 3 reps;30 seconds   Quad Stretch Limitations prone with rope    ITB  Stretch 3 reps;30 seconds   ITB Stretch Limitations beside step   Gastroc Stretch 3 reps;30 seconds   Gastroc Stretch Limitations slant board   Knee/Hip Exercises: Aerobic   Stationary Bike 8' seat 10 for ROM   Knee/Hip Exercises: Standing   Forward Lunges Both;1 set;10 reps   Forward Lunges Limitations 4 inch box    Side Lunges Both;1 set;10 reps   Side Lunges Limitations 4 inch box    Terminal Knee Extension Both;1 set;15 reps   Theraband Level (Terminal Knee Extension) Level 4 (Blue)   Terminal Knee Extension Limitations 2-3 second holds    Lateral Step Up Right;10 reps;Hand Hold: 1;Step Height: 4"   Step Down 5 reps;Limitations   Step Down Limitations common single leg balance reach   Other Standing Knee Exercises Hip IR box walks 1x15 each side    Other Standing Knee Exercises 3D hip excursion   Knee/Hip Exercises: Prone   Other Prone Exercises TKE 10x5"                  PT Short Term Goals - 05/08/14 1347    PT SHORT TERM GOAL #1   Title Patient will demonstrate 8 degrees extension R knee and at least 95 degrees flexion R knee   PT SHORT TERM GOAL #2   Title Patient will demonstrate at least 4/5 strength in all proximal muscles and in R leg in order to increase overall strength and stability    PT SHORT TERM GOAL #3   Title Patient will demonstrate the ability to stand for at least 45 minutes with pain no more than 2/10 pain and will be able to ambulate with cane at least 20 minutes and pain no more than 2/10   PT SHORT TERM GOAL #4   Title Patient will demonstrate bilateral hip IR of at least 38 degrees in order to reduce stress on bilateral knees and improve overall functional mechanics            PT Long Term Goals - 05/08/14 1347    PT LONG TERM GOAL #1   Title Patient will consistently and correctly be able to perform advanced HEP on an independent basis    PT LONG TERM GOAL #2   Title Patient will be able to sit and stand for unlimited amounts of time  with no assistive device, and will be able to walk unlimited distances without assistive device and pain no more than 1/10 R knee    PT LONG  TERM GOAL #3   Title Patient will report that he has been able to return his fitness routine without difficulty or increased pain in R knee    PT LONG TERM GOAL #4   Title Patient will demonstrate no less than 3 degrees extension and 115 degrees flexion R knee to enhance ability to participate in functional tasks                Plan - 05/08/14 1039    Clinical Impression Statement Session focus on improving ROM and gait mechanics.  During knee drives pt. complained on popping near fibula head, joint mobs complete with no c/o popping following and continued IT band stretches.  Pt given HEP printout of ITB stretch.  Pt with improved gait mechanics following cueing to improve heel to toe with reciprocal arm swing.  Pt continues to demonstrate weak gluteal and quad control with stair training especially descending stairs.  Added single leg reaching to improve eccentric control with stairs.  Pt encouraged to apply ice  once home to assist wtih edema and pain.     PT Next Visit Plan Continue functional exercises and stretches, elliptical and bike, stair training, knee ROM exercises. Check status of inflammation/edema in R knee.         Problem List Patient Active Problem List   Diagnosis Date Noted  . S/P right TKA 03/23/2014  . S/P knee replacement 03/23/2014  . Spinal stenosis of cervicothoracic region 10/08/2013  . RLS (restless legs syndrome) 10/08/2013  . DM (diabetes mellitus) type 2, uncontrolled, with ketoacidosis 10/08/2013  . Preoperative cardiovascular examination 08/14/2013  . Sleep apnea with use of continuous positive airway pressure (CPAP) 04/02/2013  . 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/02/2013  . Diabetic neuropathy 07/23/2012  . Obesity, morbid 05/01/2012  . Hypersomnia, persistent 05/01/2012  . OSA on CPAP  04/19/2012  . Essential hypertension, benign 12/16/2009  . CORONARY ATHEROSCLEROSIS NATIVE CORONARY ARTERY 12/16/2009  . Type 2 diabetes mellitus, uncontrolled 11/30/2009  . HYPERLIPIDEMIA 11/30/2009   Ihor Austin, [place]; [place] [id number] [phone]  Aldona Lento 05/08/2014, 2:27 PM  [facility] [address] [place], [place], [address] Phone: [phone]   Fax:  [phone]

## 2014-05-08 NOTE — Patient Instructions (Signed)
IT Band: Wall Lean With Crossed Leg   Stand with left hand on wall. Cross right leg behind other leg. Stretch hip toward wall with other arm supporting trunk. Hold 30  seconds. Relax. Repeat 3 times. Do 1-2 times a day. Repeat on other side.  Copyright  VHI. All rights reserved.

## 2014-05-11 ENCOUNTER — Ambulatory Visit (HOSPITAL_COMMUNITY): Payer: Medicare Other | Admitting: Physical Therapy

## 2014-05-11 DIAGNOSIS — M6281 Muscle weakness (generalized): Secondary | ICD-10-CM

## 2014-05-11 DIAGNOSIS — M25561 Pain in right knee: Secondary | ICD-10-CM | POA: Diagnosis not present

## 2014-05-11 DIAGNOSIS — Z96651 Presence of right artificial knee joint: Secondary | ICD-10-CM

## 2014-05-11 DIAGNOSIS — Z471 Aftercare following joint replacement surgery: Secondary | ICD-10-CM | POA: Diagnosis not present

## 2014-05-11 DIAGNOSIS — M25661 Stiffness of right knee, not elsewhere classified: Secondary | ICD-10-CM | POA: Diagnosis not present

## 2014-05-11 DIAGNOSIS — M25669 Stiffness of unspecified knee, not elsewhere classified: Secondary | ICD-10-CM

## 2014-05-11 DIAGNOSIS — R269 Unspecified abnormalities of gait and mobility: Secondary | ICD-10-CM

## 2014-05-11 NOTE — Therapy (Signed)
[place], [place], [address] Phone: [phone]   Fax:  [phone]  Physical Therapy Treatment  Patient Details  Name: Paul Baker MRN: [id number] Date of Birth: Jan 25, 1950 Referring Provider:  Paralee Cancel, MD  Encounter Date: 05/11/2014      PT End of Session - 05/11/14 0938    Visit Number 9   Number of Visits 12   Date for PT Re-Evaluation 05/11/14   Authorization Type Medicare/UHC   Authorization Time Period 04/13/14 to 06/13/14   Authorization - Visit Number 9   Authorization - Number of Visits 10   PT Start Time [phone]   PT Stop Time 0930   PT Time Calculation (min) 38 min   Activity Tolerance Patient tolerated treatment well   Behavior During Therapy [facility] for tasks assessed/performed      Past Medical History  Diagnosis Date  . Arthritis   . Type 2 diabetes mellitus   . Mixed hyperlipidemia   . Essential hypertension, benign   . Septic arthritis of knee, left   . OSA (obstructive sleep apnea)   . Coronary atherosclerosis of native coronary artery     Diseased nondominant RCA  . Pulmonary embolism 2004  . DVT (deep venous thrombosis) 2005    RT ARM / SHOULDER   . Rotator cuff disorder     LEFT  . Colon polyp   . Morbid obesity   . RLS (restless legs syndrome)   . Hypersomnia     CPAP of 16 cm, diagnosed with AHI of 60 in 2012,epworth 21- narcolepsy?  [name] MRSA (methicillin resistant staph aureus) culture positive     08/2012  . Narcolepsy   . Complication of anesthesia 1991    DIFFICULTY WAKING UP "TEMP DROPPED" - NO PROBLEM SINCE WITH ANESTHESIA  . Asthma     NO RECENT PROBLEMS  . Headache   . Abnormal movement of lower extremity     ALSO UPPER EXTREMITIES - FOLLOWED BY DR. DOLMEYER  . History of nonmelanoma skin cancer   . Psoriasis   . History of transfusion   . PE (pulmonary embolism) 1982    AFTER KNEE ARTHROTOMY  . Hypothyroidism   . Cancer     HX SKION CANCER     Past Surgical History  Procedure Laterality Date  . Total knee arthroplasty  2003    LEFT  . Lumbar disc surgery      Left L3, L4, L5 discecotomy with decompression of L4 root  . Wrist surgery    . Total knee revision  2005    LEFT  . Colonoscopy    . Back surgery    . Knee surgery      X 25 TIMES  . Total knee arthroplasty Right 03/23/2014    Procedure: RIGHT TOTAL KNEE ARTHROPLASTY AND REMOVAL RIGHT TIBIAL  DEEP IMPLANT STAPLE;  Surgeon: Paralee Cancel, MD;  Location: [facility];  Service: Orthopedics;  Laterality: Right;    There were no vitals filed for this visit.  Visit Diagnosis:  Right knee pain  Status post right knee replacement  Decreased range of motion (ROM) of knee  Abnormality of gait  Proximal muscle weakness      Subjective Assessment - 05/11/14 0933    Subjective Pt states his knee has been swelling and hurting all weekend.  STates the lateral knee "popped" during one session last week and has been tender ever since.  Then yesterday, he got up into his  sons truck and increased his tenderness again.[name]  Pt presents today with antalgic gait and edema around Rt knee.  Reports pain of 6/10 today.   Currently in Pain? Yes   Pain Score 6    Pain Location Knee   Pain Orientation Right                         [facility] Adult PT Treatment/Exercise - 05/11/14 0937    Knee/Hip Exercises: Aerobic   Stationary Bike 10' seat 9 for ROM   Manual Therapy   Manual Therapy Edema management   Edema Management to Rt knee with elevation to decrease swelling                PT Education - 05/11/14 0937    Education provided Yes   Education Details Edema management.  Instructed to complete retro massage and ice with elevation above heart   Person(s) Educated Patient   Methods Explanation;Demonstration   Comprehension Verbalized understanding          PT Short Term Goals - 05/08/14 1347    PT SHORT TERM GOAL #1   Title Patient will demonstrate 8  degrees extension R knee and at least 95 degrees flexion R knee   PT SHORT TERM GOAL #2   Title Patient will demonstrate at least 4/5 strength in all proximal muscles and in R leg in order to increase overall strength and stability    PT SHORT TERM GOAL #3   Title Patient will demonstrate the ability to stand for at least 45 minutes with pain no more than 2/10 pain and will be able to ambulate with cane at least 20 minutes and pain no more than 2/10   PT SHORT TERM GOAL #4   Title Patient will demonstrate bilateral hip IR of at least 38 degrees in order to reduce stress on bilateral knees and improve overall functional mechanics            PT Long Term Goals - 05/08/14 1347    PT LONG TERM GOAL #1   Title Patient will consistently and correctly be able to perform advanced HEP on an independent basis    PT LONG TERM GOAL #2   Title Patient will be able to sit and stand for unlimited amounts of time with no assistive device, and will be able to walk unlimited distances without assistive device and pain no more than 1/10 R knee    PT LONG TERM GOAL #3   Title Patient will report that he has been able to return his fitness routine without difficulty or increased pain in R knee    PT LONG TERM GOAL #4   Title Patient will demonstrate no less than 3 degrees extension and 115 degrees flexion R knee to enhance ability to participate in functional tasks                Plan - 05/11/14 0939    Clinical Impression Statement completed retro massage with elevation to decrease swelling and pain.  Pt reported redution to 3/10 following retro (3 point deduction).  Pt with decreased antalgia as well.  Completed bike for ROM following manual with patient able to make full revolutions bilaterally.  Pt was very pleased as he has increased ROM on Lt as well as Rt.  PT educated on retro massage and ice with elevation above heart.  Pt verbalized understanding.    PT Next Visit Plan Continue functional  exercises  and stretches, elliptical and bike, stair training, knee ROM exercises. Check status of inflammation/edema in R knee.         Problem List Patient Active Problem List   Diagnosis Date Noted  . S/P right TKA 03/23/2014  . S/P knee replacement 03/23/2014  . Spinal stenosis of cervicothoracic region 10/08/2013  . RLS (restless legs syndrome) 10/08/2013  . DM (diabetes mellitus) type 2, uncontrolled, with ketoacidosis 10/08/2013  . Preoperative cardiovascular examination 08/14/2013  . Sleep apnea with use of continuous positive airway pressure (CPAP) 04/02/2013  . 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/02/2013  . Diabetic neuropathy 07/23/2012  . Obesity, morbid 05/01/2012  . Hypersomnia, persistent 05/01/2012  . OSA on CPAP 04/19/2012  . Essential hypertension, benign 12/16/2009  . CORONARY ATHEROSCLEROSIS NATIVE CORONARY ARTERY 12/16/2009  . Type 2 diabetes mellitus, uncontrolled 11/30/2009  . HYPERLIPIDEMIA 11/30/2009    Teena Irani, PTA/CLT [phone] 05/11/2014, 9:43 AM  [place], [place], [address] Phone: [phone]   Fax:  [phone]

## 2014-05-12 ENCOUNTER — Other Ambulatory Visit: Payer: Self-pay | Admitting: Nurse Practitioner

## 2014-05-13 ENCOUNTER — Ambulatory Visit (HOSPITAL_COMMUNITY): Payer: Medicare Other | Admitting: Physical Therapy

## 2014-05-13 DIAGNOSIS — M25561 Pain in right knee: Secondary | ICD-10-CM

## 2014-05-13 DIAGNOSIS — Z471 Aftercare following joint replacement surgery: Secondary | ICD-10-CM | POA: Diagnosis not present

## 2014-05-13 DIAGNOSIS — Z96651 Presence of right artificial knee joint: Secondary | ICD-10-CM | POA: Diagnosis not present

## 2014-05-13 DIAGNOSIS — M25669 Stiffness of unspecified knee, not elsewhere classified: Secondary | ICD-10-CM

## 2014-05-13 DIAGNOSIS — M6281 Muscle weakness (generalized): Secondary | ICD-10-CM

## 2014-05-13 DIAGNOSIS — M25661 Stiffness of right knee, not elsewhere classified: Secondary | ICD-10-CM | POA: Diagnosis not present

## 2014-05-13 DIAGNOSIS — R269 Unspecified abnormalities of gait and mobility: Secondary | ICD-10-CM

## 2014-05-13 NOTE — Therapy (Signed)
[place] [address] [place], [place], [address] Phone: [phone]   Fax:  [phone]  Physical Therapy Treatment (Re-Assessment)  Patient Details  Name: Paul Baker MRN: [id number] Date of Birth: [birth year] Referring Provider:  Paralee Cancel, MD  Encounter Date: 05/13/2014      PT End of Session - 05/13/14 0931    Visit Number 10   Number of Visits 12   Date for PT Re-Evaluation 06/10/14   Authorization Type Medicare/UHC   Authorization Time Period Apr 26, 2014 to 06-26-14; G-codes done 10th visit    Authorization - Visit Number 10   Authorization - Number of Visits 10   PT Start Time [phone]   PT Stop Time 0930   PT Time Calculation (min) 39 min   Activity Tolerance Patient tolerated treatment well   Behavior During Therapy [facility] for tasks assessed/performed      Past Medical History  Diagnosis Date  . Arthritis   . Type 2 diabetes mellitus   . Mixed hyperlipidemia   . Essential hypertension, benign   . Septic arthritis of knee, left   . OSA (obstructive sleep apnea)   . Coronary atherosclerosis of native coronary artery     Diseased nondominant RCA  . Pulmonary embolism 2004  . DVT (deep venous thrombosis) 2005    RT ARM / SHOULDER   . Rotator cuff disorder     LEFT  . Colon polyp   . Morbid obesity   . RLS (restless legs syndrome)   . Hypersomnia     CPAP of 16 cm, diagnosed with AHI of 60 in 2012,epworth 21- narcolepsy?  [name] MRSA (methicillin resistant staph aureus) culture positive     08/2012  . Narcolepsy   . Complication of anesthesia 1991    DIFFICULTY WAKING UP "TEMP DROPPED" - NO PROBLEM SINCE WITH ANESTHESIA  . Asthma     NO RECENT PROBLEMS  . Headache   . Abnormal movement of lower extremity     ALSO UPPER EXTREMITIES - FOLLOWED BY DR. DOLMEYER  . History of nonmelanoma skin cancer   . Psoriasis   . History of transfusion   . PE (pulmonary embolism) 1982    AFTER KNEE ARTHROTOMY  .  Hypothyroidism   . Cancer     HX SKION CANCER    Past Surgical History  Procedure Laterality Date  . Total knee arthroplasty  2003    LEFT  . Lumbar disc surgery      Left L3, L4, L5 discecotomy with decompression of L4 root  . Wrist surgery    . Total knee revision  2005    LEFT  . Colonoscopy    . Back surgery    . Knee surgery      X 25 TIMES  . Total knee arthroplasty Right 03/23/2014    Procedure: RIGHT TOTAL KNEE ARTHROPLASTY AND REMOVAL RIGHT TIBIAL  DEEP IMPLANT STAPLE;  Surgeon: Paralee Cancel, MD;  Location: [facility];  Service: Orthopedics;  Laterality: Right;    There were no vitals filed for this visit.  Visit Diagnosis:  Right knee pain  Status post right knee replacement  Decreased range of motion (ROM) of knee  Abnormality of gait  Proximal muscle weakness      Subjective Assessment - 05/13/14 0853    Subjective Patient states his knee is still sore after it popped laterally, thinks that it might be scar tissue due to all of the scar tissue that has built up in  his knee over the years due to surgeries.    Pertinent History Patient states that he has had a large amount of knee surgeries in the past, including multiple surgeries to repair cartilage and ligaments. Patient states that his right knee had become completely worn out with meniscus tears all the way around, very painful and eventually required replacement.  States his L knee replacement took a long time to fully recover due to complications with infection and that he never really got full range back in his L knee.    How long can you sit comfortably? 5/11- 30 minutes    How long can you stand comfortably? 5/11- 10 minutes    How long can you walk comfortably? 5/11- has been walking around [facility] now with just some occasional pain towards the end of his walk   Currently in Pain? Yes   Pain Score 3    Pain Location Knee   Pain Orientation Right;Lateral            [facility] PT Assessment -  05/13/14 0001    Assessment   Medical Diagnosis R Total knee replacement    Onset Date 03/23/14   Next MD Visit End of May    Precautions   Precautions None   Precaution Comments WBAT   Prior Function   Level of Independence Independent with basic ADLs;Independent with homemaking with ambulation;Independent with gait;Independent with transfers   Vocation Retired   Leisure Surveyor, mining, water tubing    Observation/Other Assessments   Focus on Therapeutic Outcomes (FOTO)  61% limited    AROM   Right Hip External Rotation  38   Right Hip Internal Rotation  38   Left Hip External Rotation  45   Left Hip Internal Rotation  32   Right Knee Extension 9   Right Knee Flexion 92   Strength   Right Hip Flexion 4-/5   Right Hip Extension 4/5   Right Hip ABduction 4-/5   Left Hip Flexion 4-/5   Left Hip Extension 4/5   Left Hip ABduction 4+/5   Right Knee Flexion 4+/5   Right Knee Extension 5/5   Left Knee Flexion 5/5   Left Knee Extension 5/5   Right Ankle Dorsiflexion 5/5   Left Ankle Dorsiflexion 5/5   Flexibility   Hamstrings bilateral tightness                      [facility] Adult PT Treatment/Exercise - 05/13/14 0001    Knee/Hip Exercises: Stretches   Active Hamstring Stretch 3 reps;30 seconds   Active Hamstring Stretch Limitations 14 inch step    Gastroc Stretch 3 reps;30 seconds   Gastroc Stretch Limitations slant board   Knee/Hip Exercises: Aerobic   Stationary Bike 10' seat 9 for ROM                PT Education - 05/13/14 0930    Education provided Yes   Education Details education regarding progress with skilled PT services, plan of care moving forward    Person(s) Educated Patient   Methods Explanation   Comprehension Verbalized understanding          PT Short Term Goals - 05/13/14 8280    PT SHORT TERM GOAL #1   Title Patient will demonstrate 8 degrees extension R knee and at least 95 degrees flexion R knee   Baseline 5/11- 9  degrees extension 92 degreees flexion    Time 2   Period  Weeks   Status On-going   PT SHORT TERM GOAL #2   Title Patient will demonstrate at least 4/5 strength in all proximal muscles and in R leg in order to increase overall strength and stability    Time 2   Period Weeks   Status On-going   PT SHORT TERM GOAL #3   Title Patient will demonstrate the ability to stand for at least 45 minutes with pain no more than 2/10 pain and will be able to ambulate with cane at least 20 minutes and pain no more than 2/10   Baseline 5/11- not able to stand 45 minutes, but is now ambulating without cane    Time 2   Period Weeks   Status Partially Met   PT SHORT TERM GOAL #4   Title Patient will demonstrate bilateral hip IR of at least 38 degrees in order to reduce stress on bilateral knees and improve overall functional mechanics    Baseline 5/11- met unilaterally, other side remains tight at 32 degrees    Time 2   Period Weeks   Status On-going           PT Long Term Goals - 05/13/14 9528    PT LONG TERM GOAL #1   Title Patient will consistently and correctly be able to perform advanced HEP on an independent basis    Time 4   Period Weeks   Status Achieved   PT LONG TERM GOAL #2   Title Patient will be able to sit and stand for unlimited amounts of time with no assistive device, and will be able to walk unlimited distances without assistive device and pain no more than 1/10 R knee    Time 4   Period Weeks   Status On-going   PT LONG TERM GOAL #3   Title Patient will report that he has been able to return his fitness routine without difficulty or increased pain in R knee    Baseline 5/11- patient reports that he is still modifiying. Upper is within 10 pounds of his normal, continuing to struggle with exercises for lower extremities    Time 4   Period Weeks   Status On-going   PT LONG TERM GOAL #4   Title Patient will demonstrate no less than 3 degrees extension and 115 degrees flexion R  knee to enhance ability to participate in functional tasks    Time 4   Period Weeks   Status On-going               Plan - 05/13/14 0932    Clinical Impression Statement Re-assessment performed today. Patient shows improvements in proximal and bilateral lower extremities however does continue to show range of motion deficits in his bilateral hips and his R knee; patient also continues to display knee pain, gait deviations as well as reduced functional activity tolerance and reduced functional task performance at this time. Patient states that he feels that the regimen at this clniic is one of the best out of all of his experiences and that his range improvement is better than it was when the manipulation was done on his other knee.  The patient will continue to benefit from participation in skilled PT services, at 2-3x/week for 4 more weeks  to address these deficits as well as to reduce pain and assist him in returning to an optimal level of function.    Pt will benefit from skilled therapeutic intervention in order to improve on the following deficits  Abnormal gait;Decreased coordination;Decreased range of motion;Difficulty walking;Impaired flexibility;Improper body mechanics;Postural dysfunction;Increased edema;Decreased endurance;Decreased activity tolerance;Decreased skin integrity;Decreased balance;Decreased scar mobility;Pain;Decreased mobility;Decreased strength   Rehab Potential Good   PT Frequency 3x / week   PT Duration 4 weeks   PT Treatment/Interventions ADLs/Self Care Home Management;Gait training;Neuromuscular re-education;Stair training;Scar mobilization;Functional mobility training;Patient/family education;Cryotherapy;Therapeutic activities;Therapeutic exercise;Manual techniques;Moist Heat;Balance training   PT Next Visit Plan Continue functional exercises and stretches, elliptical and bike, stair training, knee ROM exercises. Check status of inflammation/edema in R knee.    PT  Home Exercise Plan bridges, sidelying hip ABD, forward knee drives on stairs, hamstring stretch on stairs    Consulted and Agree with Plan of Care Patient          G-Codes - 06/06/14 0939    Functional Assessment Tool Used FOTO    Functional Limitation Mobility: Walking and moving around   Mobility: Walking and Moving Around Current Status [phone]) At least 60 percent but less than 80 percent impaired, limited or restricted   Mobility: Walking and Moving Around Goal Status [phone]) At least 40 percent but less than 60 percent impaired, limited or restricted      Problem List Patient Active Problem List   Diagnosis Date Noted  . S/P right TKA 03/23/2014  . S/P knee replacement 03/23/2014  . Spinal stenosis of cervicothoracic region 10/08/2013  . RLS (restless legs syndrome) 10/08/2013  . DM (diabetes mellitus) type 2, uncontrolled, with ketoacidosis 10/08/2013  . Preoperative cardiovascular examination 08/14/2013  . Sleep apnea with use of continuous positive airway pressure (CPAP) 04/02/2013  . 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/02/2013  . Diabetic neuropathy 07/23/2012  . Obesity, morbid 05/01/2012  . Hypersomnia, persistent 05/01/2012  . OSA on CPAP 04/19/2012  . Essential hypertension, benign 12/16/2009  . CORONARY ATHEROSCLEROSIS NATIVE CORONARY ARTERY 12/16/2009  . Type 2 diabetes mellitus, uncontrolled 11/30/2009  . HYPERLIPIDEMIA 11/30/2009    Deniece Ree PT, DPT [phone]  [facility] [address] [place], [place], [address] Phone: [phone]   Fax:  [phone]

## 2014-05-15 ENCOUNTER — Ambulatory Visit (HOSPITAL_COMMUNITY): Payer: Medicare Other

## 2014-05-15 DIAGNOSIS — Z96651 Presence of right artificial knee joint: Secondary | ICD-10-CM

## 2014-05-15 DIAGNOSIS — M6281 Muscle weakness (generalized): Secondary | ICD-10-CM

## 2014-05-15 DIAGNOSIS — R269 Unspecified abnormalities of gait and mobility: Secondary | ICD-10-CM

## 2014-05-15 DIAGNOSIS — E291 Testicular hypofunction: Secondary | ICD-10-CM | POA: Diagnosis not present

## 2014-05-15 DIAGNOSIS — M25661 Stiffness of right knee, not elsewhere classified: Secondary | ICD-10-CM | POA: Diagnosis not present

## 2014-05-15 DIAGNOSIS — M25669 Stiffness of unspecified knee, not elsewhere classified: Secondary | ICD-10-CM

## 2014-05-15 DIAGNOSIS — M25561 Pain in right knee: Secondary | ICD-10-CM

## 2014-05-15 DIAGNOSIS — Z471 Aftercare following joint replacement surgery: Secondary | ICD-10-CM | POA: Diagnosis not present

## 2014-05-15 NOTE — Therapy (Signed)
[place], [place], [address] Phone: [phone]   Fax:  [phone]  Physical Therapy Treatment  Patient Details  Name: Paul Baker MRN: [id number] Date of Birth: [birth year] Referring Provider:  Paralee Cancel, MD  Encounter Date: 05/15/2014      PT End of Session - 05/15/14 0900    Visit Number 11   Number of Visits 22   Date for PT Re-Evaluation 06/10/14   Authorization Type Medicare/UHC   Authorization Time Period 2014-05-13 to 07-13-14; G-codes done 10th visit    Authorization - Visit Number 11   Authorization - Number of Visits 20   PT Start Time 0853   PT Stop Time 0931   PT Time Calculation (min) 38 min   Activity Tolerance Patient tolerated treatment well   Behavior During Therapy [facility] for tasks assessed/performed      Past Medical History  Diagnosis Date  . Arthritis   . Type 2 diabetes mellitus   . Mixed hyperlipidemia   . Essential hypertension, benign   . Septic arthritis of knee, left   . OSA (obstructive sleep apnea)   . Coronary atherosclerosis of native coronary artery     Diseased nondominant RCA  . Pulmonary embolism 2004  . DVT (deep venous thrombosis) 2005    RT ARM / SHOULDER   . Rotator cuff disorder     LEFT  . Colon polyp   . Morbid obesity   . RLS (restless legs syndrome)   . Hypersomnia     CPAP of 16 cm, diagnosed with AHI of 60 in 2012,epworth 21- narcolepsy?  [name] MRSA (methicillin resistant staph aureus) culture positive     08/2012  . Narcolepsy   . Complication of anesthesia 1991    DIFFICULTY WAKING UP "TEMP DROPPED" - NO PROBLEM SINCE WITH ANESTHESIA  . Asthma     NO RECENT PROBLEMS  . Headache   . Abnormal movement of lower extremity     ALSO UPPER EXTREMITIES - FOLLOWED BY DR. DOLMEYER  . History of nonmelanoma skin cancer   . Psoriasis   . History of transfusion   . PE (pulmonary embolism) 1982    AFTER KNEE ARTHROTOMY  . Hypothyroidism   . Cancer      HX SKION CANCER    Past Surgical History  Procedure Laterality Date  . Total knee arthroplasty  2003    LEFT  . Lumbar disc surgery      Left L3, L4, L5 discecotomy with decompression of L4 root  . Wrist surgery    . Total knee revision  2005    LEFT  . Colonoscopy    . Back surgery    . Knee surgery      X 25 TIMES  . Total knee arthroplasty Right 03/23/2014    Procedure: RIGHT TOTAL KNEE ARTHROPLASTY AND REMOVAL RIGHT TIBIAL  DEEP IMPLANT STAPLE;  Surgeon: Paralee Cancel, MD;  Location: [facility];  Service: Orthopedics;  Laterality: Right;    There were no vitals filed for this visit.  Visit Diagnosis:  Right knee pain  Status post right knee replacement  Decreased range of motion (ROM) of knee  Abnormality of gait  Proximal muscle weakness      Subjective Assessment - 05/15/14 0853    Subjective Pt stated pain scale 3/10 lateral aspect of distal Rt knee, pt stated increase pain following pop during ITBand stretch   Currently in Pain? Yes   Pain Score 3  Pain Location Knee   Pain Orientation Right;Lateral                [facility] Adult PT Treatment/Exercise - 05/15/14 0001    Exercises   Exercises Knee/Hip   Knee/Hip Exercises: Stretches   Active Hamstring Stretch 3 reps;30 seconds   Active Hamstring Stretch Limitations 14 inch step    ITB Stretch Limitations   ITB Stretch Limitations 10x3"   Gastroc Stretch 3 reps;30 seconds   Gastroc Stretch Limitations slant board   Knee/Hip Exercises: Aerobic   Stationary Bike 8' seat 9 full revoluation form ROM   Knee/Hip Exercises: Standing   Terminal Knee Extension Both;1 set;15 reps   Theraband Level (Terminal Knee Extension) Level 4 (Blue)   Terminal Knee Extension Limitations 2-3 second holds    Lateral Step Up Right;10 reps;Hand Hold: 1;Step Height: 4"   Forward Step Up Both;15 reps;Hand Hold: 1;Step Height: 6"   Step Down Right;10 reps;Hand Hold: 1;Step Height: 4"   Knee/Hip Exercises: Supine   Short Arc  Quad Sets Both;15 reps   Manual Therapy   Manual Therapy Myofascial release;Joint mobilization   Joint Mobilization Fib/tib joint mobs grade III   Myofascial Release MFR to lateral Rt knee ITB; utilized the stick to Rt ITB            PT Short Term Goals - 05/15/14 0907    PT SHORT TERM GOAL #1   Title Patient will demonstrate 8 degrees extension R knee and at least 95 degrees flexion R knee   Status On-going   PT SHORT TERM GOAL #2   Title Patient will demonstrate at least 4/5 strength in all proximal muscles and in R leg in order to increase overall strength and stability    Status On-going   PT SHORT TERM GOAL #3   Title Patient will demonstrate the ability to stand for at least 45 minutes with pain no more than 2/10 pain and will be able to ambulate with cane at least 20 minutes and pain no more than 2/10   Status On-going   PT SHORT TERM GOAL #4   Title Patient will demonstrate bilateral hip IR of at least 38 degrees in order to reduce stress on bilateral knees and improve overall functional mechanics    Status On-going           PT Long Term Goals - 05/15/14 0907    PT LONG TERM GOAL #1   Title Patient will consistently and correctly be able to perform advanced HEP on an independent basis    Status Achieved   PT LONG TERM GOAL #2   Title Patient will be able to sit and stand for unlimited amounts of time with no assistive device, and will be able to walk unlimited distances without assistive device and pain no more than 1/10 R knee    Status On-going   PT LONG TERM GOAL #3   Title Patient will report that he has been able to return his fitness routine without difficulty or increased pain in R knee    Baseline 5/11- patient reports that he is still modifiying. Upper is within 10 pounds of his normal, continuing to struggle with exercises for lower extremities    PT LONG TERM GOAL #4   Title Patient will demonstrate no less than 3 degrees extension and 115 degrees flexion  R knee to enhance ability to participate in functional tasks            Plan -  05/15/14 1505    Clinical Impression Statement Session focus on improving AROM, functional strengthening and manual techniques to reduce pain on lateral aspect of Rt knee.  Able to move chair up on bike for increased knee flexion and continued quad strengthening exercises to improve extension with minimal difficutly.  Pt continues to have difficutly descending stairs due to weak eccentric control and pain.  Completed fib/tib joint mobs grade III and MFR to lateral aspect of knee and ITB with ability to decrease edema on lateral knee with reoprts of pain reduced following.    PT Next Visit Plan Continue functional exercises and stretches, elliptical and bike, stair training, knee ROM exercises. Check status of inflammation/edema in R knee.         Problem List Patient Active Problem List   Diagnosis Date Noted  . S/P right TKA 03/23/2014  . S/P knee replacement 03/23/2014  . Spinal stenosis of cervicothoracic region 10/08/2013  . RLS (restless legs syndrome) 10/08/2013  . DM (diabetes mellitus) type 2, uncontrolled, with ketoacidosis 10/08/2013  . Preoperative cardiovascular examination 08/14/2013  . Sleep apnea with use of continuous positive airway pressure (CPAP) 04/02/2013  . 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/02/2013  . Diabetic neuropathy 07/23/2012  . Obesity, morbid 05/01/2012  . Hypersomnia, persistent 05/01/2012  . OSA on CPAP 04/19/2012  . Essential hypertension, benign 12/16/2009  . CORONARY ATHEROSCLEROSIS NATIVE CORONARY ARTERY 12/16/2009  . Type 2 diabetes mellitus, uncontrolled 11/30/2009  . HYPERLIPIDEMIA 11/30/2009   Ihor Austin, [place]; [place] #[id number] [phone]   Aldona Lento 05/15/2014, 3:10 PM  [place] [address] [place], [place], [address] Phone: [phone]   Fax:  [phone]

## 2014-05-18 ENCOUNTER — Ambulatory Visit (HOSPITAL_COMMUNITY): Payer: Medicare Other

## 2014-05-18 DIAGNOSIS — M25661 Stiffness of right knee, not elsewhere classified: Secondary | ICD-10-CM | POA: Diagnosis not present

## 2014-05-18 DIAGNOSIS — M25561 Pain in right knee: Secondary | ICD-10-CM

## 2014-05-18 DIAGNOSIS — Z96651 Presence of right artificial knee joint: Secondary | ICD-10-CM | POA: Diagnosis not present

## 2014-05-18 DIAGNOSIS — R269 Unspecified abnormalities of gait and mobility: Secondary | ICD-10-CM

## 2014-05-18 DIAGNOSIS — M6281 Muscle weakness (generalized): Secondary | ICD-10-CM

## 2014-05-18 DIAGNOSIS — Z471 Aftercare following joint replacement surgery: Secondary | ICD-10-CM | POA: Diagnosis not present

## 2014-05-18 DIAGNOSIS — M25669 Stiffness of unspecified knee, not elsewhere classified: Secondary | ICD-10-CM

## 2014-05-18 NOTE — Therapy (Addendum)
[place], [place], [address] Phone: [phone]   Fax:  [phone]  Physical Therapy Treatment  Patient Details  Name: Paul Baker MRN: [id number] Date of Birth: Jun 04, 1950 Referring Provider:  Paralee Cancel, MD  Encounter Date: 05/18/2014      PT End of Session - 05/18/14 0859    Visit Number 12   Number of Visits 22   Date for PT Re-Evaluation 06/10/14   Authorization Type Medicare/UHC   Authorization Time Period 05/12/2014 to 12-Jul-2014; G-codes done 10th visit    Authorization - Visit Number 12   Authorization - Number of Visits 20   PT Start Time 0848   PT Stop Time 0936   PT Time Calculation (min) 48 min   Activity Tolerance Patient tolerated treatment well   Behavior During Therapy [facility] for tasks assessed/performed      Past Medical History  Diagnosis Date  . Arthritis   . Type 2 diabetes mellitus   . Mixed hyperlipidemia   . Essential hypertension, benign   . Septic arthritis of knee, left   . OSA (obstructive sleep apnea)   . Coronary atherosclerosis of native coronary artery     Diseased nondominant RCA  . Pulmonary embolism 2004  . DVT (deep venous thrombosis) 2005    RT ARM / SHOULDER   . Rotator cuff disorder     LEFT  . Colon polyp   . Morbid obesity   . RLS (restless legs syndrome)   . Hypersomnia     CPAP of 16 cm, diagnosed with AHI of 60 in 2012,epworth 21- narcolepsy?  [name] MRSA (methicillin resistant staph aureus) culture positive     08/2012  . Narcolepsy   . Complication of anesthesia 1991    DIFFICULTY WAKING UP "TEMP DROPPED" - NO PROBLEM SINCE WITH ANESTHESIA  . Asthma     NO RECENT PROBLEMS  . Headache   . Abnormal movement of lower extremity     ALSO UPPER EXTREMITIES - FOLLOWED BY DR. DOLMEYER  . History of nonmelanoma skin cancer   . Psoriasis   . History of transfusion   . PE (pulmonary embolism) 1982    AFTER KNEE ARTHROTOMY  . Hypothyroidism   . Cancer      HX SKION CANCER    Past Surgical History  Procedure Laterality Date  . Total knee arthroplasty  2003    LEFT  . Lumbar disc surgery      Left L3, L4, L5 discecotomy with decompression of L4 root  . Wrist surgery    . Total knee revision  2005    LEFT  . Colonoscopy    . Back surgery    . Knee surgery      X 25 TIMES  . Total knee arthroplasty Right 03/23/2014    Procedure: RIGHT TOTAL KNEE ARTHROPLASTY AND REMOVAL RIGHT TIBIAL  DEEP IMPLANT STAPLE;  Surgeon: Paralee Cancel, MD;  Location: [facility];  Service: Orthopedics;  Laterality: Right;    There were no vitals filed for this visit.  Visit Diagnosis:  Right knee pain  Status post right knee replacement  Decreased range of motion (ROM) of knee  Abnormality of gait  Proximal muscle weakness      Subjective Assessment - 05/18/14 0852    Subjective Pt stated mainly discomfort 3/10, reports compliance wth HEP daily.  Reported he has been completeing the manual to ITBand with decreased popping over weekend   Currently in Pain? Yes  Pain Score 3    Pain Location Knee   Pain Orientation Right;Lateral   Pain Descriptors / Indicators Discomfort;Sore;Aching               [facility] Adult PT Treatment/Exercise - 05/18/14 0001    Exercises   Exercises Knee/Hip   Knee/Hip Exercises: Stretches   Active Hamstring Stretch 3 reps;30 seconds   Active Hamstring Stretch Limitations 14 inch step    ITB Stretch 3 reps;30 seconds;Limitations   ITB Stretch Limitations 10x3"   Gastroc Stretch 3 reps;30 seconds   Gastroc Stretch Limitations slant board   Knee/Hip Exercises: Aerobic   Stationary Bike 8' seat 9 full revoluation form ROM   Knee/Hip Exercises: Standing   Forward Lunges Both;1 set;10 reps   Forward Lunges Limitations 4 inch box    Side Lunges Both;1 set;10 reps   Side Lunges Limitations 4 inch box    Terminal Knee Extension Right;20 reps;Theraband   Theraband Level (Terminal Knee Extension) Level 4 (Blue)    Terminal Knee Extension Limitations 2-3 second holds    Lateral Step Up Right;15 reps;Hand Hold: 1;Step Height: 6"   Forward Step Up Both;15 reps;Hand Hold: 1;Step Height: 6"   Step Down Right;10 reps;Hand Hold: 1;Step Height: 4"   Knee/Hip Exercises: Supine   Short Arc Quad Sets Both;20 reps       Manual Therapy  Manual Therapy Myofascial release;Joint mobilization  Joint Mobilization Fib/tib joint mobs grade III  Myofascial Release MFR to lateral Rt knee ITB; utilized the stick to Rt ITB          PT Short Term Goals - 05/18/14 0855    PT SHORT TERM GOAL #1   Title Patient will demonstrate 8 degrees extension R knee and at least 95 degrees flexion R knee   Status On-going   PT SHORT TERM GOAL #2   Title Patient will demonstrate at least 4/5 strength in all proximal muscles and in R leg in order to increase overall strength and stability    Status On-going   PT SHORT TERM GOAL #3   Title Patient will demonstrate the ability to stand for at least 45 minutes with pain no more than 2/10 pain and will be able to ambulate with cane at least 20 minutes and pain no more than 2/10   Status On-going   PT SHORT TERM GOAL #4   Title Patient will demonstrate bilateral hip IR of at least 38 degrees in order to reduce stress on bilateral knees and improve overall functional mechanics    Status On-going           PT Long Term Goals - 05/18/14 0856    PT LONG TERM GOAL #1   Title Patient will consistently and correctly be able to perform advanced HEP on an independent basis    PT LONG TERM GOAL #2   Title Patient will be able to sit and stand for unlimited amounts of time with no assistive device, and will be able to walk unlimited distances without assistive device and pain no more than 1/10 R knee    PT LONG TERM GOAL #3   Title Patient will report that he has been able to return his fitness routine without difficulty or increased pain in R knee    PT LONG TERM GOAL #4   Title  Patient will demonstrate no less than 3 degrees extension and 115 degrees flexion R knee to enhance ability to participate in functional tasks  Plan - 05/18/14 0916    Clinical Impression Statement Session focus on improving AROM, functional strengthening and manual techniques to reduce pain on lateral aspect of knee.  Pt continues to exhibit increased difficulty descending stairs with reports of pain around fibular head and ITBand with noted decreased eccentric quad control.  Pt reported relief with ITBand stretch and manual techniques complete to region.  No reports of popping during session.  AROM is progressing 9-104 degrees.  Pt educated on importance of improving quad strengtehning for terminal knee extension.     PT Next Visit Plan Continue functional exercises and stretches, elliptical and bike, stair training, knee ROM exercises. Check status of inflammation/edema in R knee.         Problem List Patient Active Problem List   Diagnosis Date Noted  . S/P right TKA 03/23/2014  . S/P knee replacement 03/23/2014  . Spinal stenosis of cervicothoracic region 10/08/2013  . RLS (restless legs syndrome) 10/08/2013  . DM (diabetes mellitus) type 2, uncontrolled, with ketoacidosis 10/08/2013  . Preoperative cardiovascular examination 08/14/2013  . Sleep apnea with use of continuous positive airway pressure (CPAP) 04/02/2013  . 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/02/2013  . Diabetic neuropathy 07/23/2012  . Obesity, morbid 05/01/2012  . Hypersomnia, persistent 05/01/2012  . OSA on CPAP 04/19/2012  . Essential hypertension, benign 12/16/2009  . CORONARY ATHEROSCLEROSIS NATIVE CORONARY ARTERY 12/16/2009  . Type 2 diabetes mellitus, uncontrolled 11/30/2009  . HYPERLIPIDEMIA 11/30/2009   Ihor Austin, [place]; [place] #[id number] [phone]  Aldona Lento 05/18/2014, 10:17 AM  [place], [place], [address] Phone: [phone]   Fax:  [phone]

## 2014-05-19 ENCOUNTER — Ambulatory Visit (HOSPITAL_COMMUNITY): Payer: Medicare Other

## 2014-05-19 DIAGNOSIS — M25669 Stiffness of unspecified knee, not elsewhere classified: Secondary | ICD-10-CM

## 2014-05-19 DIAGNOSIS — M6281 Muscle weakness (generalized): Secondary | ICD-10-CM

## 2014-05-19 DIAGNOSIS — Z471 Aftercare following joint replacement surgery: Secondary | ICD-10-CM | POA: Diagnosis not present

## 2014-05-19 DIAGNOSIS — Z96651 Presence of right artificial knee joint: Secondary | ICD-10-CM

## 2014-05-19 DIAGNOSIS — M25561 Pain in right knee: Secondary | ICD-10-CM

## 2014-05-19 DIAGNOSIS — R269 Unspecified abnormalities of gait and mobility: Secondary | ICD-10-CM

## 2014-05-19 DIAGNOSIS — M25661 Stiffness of right knee, not elsewhere classified: Secondary | ICD-10-CM | POA: Diagnosis not present

## 2014-05-19 NOTE — Therapy (Signed)
[place], [place], [address] Phone: [phone]   Fax:  [phone]  Physical Therapy Treatment  Patient Details  Name: Paul Baker MRN: [id number] Date of Birth: [birth year] Referring Provider:  [name], MD  Encounter Date: 05/19/2014      PT End of Session - 05/19/14 0817    Visit Number 13   Number of Visits 22   Date for PT Re-Evaluation 06/10/14   Authorization Type Medicare/UHC   Authorization Time Period 19-Apr-2014 to 06/19/14; G-codes done 03-18-22 visit    Authorization - Visit Number 13   Authorization - Number of Visits 20   PT Start Time 0814   PT Stop Time 0908   PT Time Calculation (min) 54 min   Activity Tolerance Patient tolerated treatment well   Behavior During Therapy [facility] for tasks assessed/performed      Past Medical History  Diagnosis Date  . Arthritis   . Type 2 diabetes mellitus   . Mixed hyperlipidemia   . Essential hypertension, benign   . Septic arthritis of knee, left   . OSA (obstructive sleep apnea)   . Coronary atherosclerosis of native coronary artery     Diseased nondominant RCA  . Pulmonary embolism 2004  . DVT (deep venous thrombosis) 2005    RT ARM / SHOULDER   . Rotator cuff disorder     LEFT  . Colon polyp   . Morbid obesity   . RLS (restless legs syndrome)   . Hypersomnia     CPAP of 16 cm, diagnosed with AHI of 60 in 2012,epworth 21- narcolepsy?  [name] MRSA (methicillin resistant staph aureus) culture positive     08/2012  . Narcolepsy   . Complication of anesthesia 1991    DIFFICULTY WAKING UP "TEMP DROPPED" - NO PROBLEM SINCE WITH ANESTHESIA  . Asthma     NO RECENT PROBLEMS  . Headache   . Abnormal movement of lower extremity     ALSO UPPER EXTREMITIES - FOLLOWED BY Paul Baker  . History of nonmelanoma skin cancer   . Psoriasis   . History of transfusion   . PE (pulmonary embolism) 1982    AFTER KNEE ARTHROTOMY  . Hypothyroidism   .  Cancer     HX SKION CANCER    Past Surgical History  Procedure Laterality Date  . Total knee arthroplasty  2003    LEFT  . Lumbar disc surgery      Left L3, L4, L5 discecotomy with decompression of L4 root  . Wrist surgery    . Total knee revision  2005    LEFT  . Colonoscopy    . Back surgery    . Knee surgery      X 25 TIMES  . Total knee arthroplasty Right 03/23/2014    Procedure: RIGHT TOTAL KNEE ARTHROPLASTY AND REMOVAL RIGHT TIBIAL  DEEP IMPLANT STAPLE;  Surgeon: Paul Cancel, MD;  Location: [facility];  Service: Orthopedics;  Laterality: Right;    There were no vitals filed for this visit.  Visit Diagnosis:  Right knee pain  Status post right knee replacement  Decreased range of motion (ROM) of knee  Abnormality of gait  Proximal muscle weakness      Subjective Assessment - 05/19/14 0815    Subjective Pt stated knee is feeling good except for lateral aspect of Rt knee, pain scale 3/10 today.   Currently in Pain? Yes   Pain Score 3  Pain Location Knee   Pain Orientation Right;Lateral   Pain Descriptors / Indicators Discomfort;Paul Baker Adult PT Treatment/Exercise - 05/19/14 0001    Exercises   Exercises Knee/Hip   Knee/Hip Exercises: Stretches   Active Hamstring Stretch 3 reps;30 seconds   Active Hamstring Stretch Limitations 14 inch step    Knee: Self-Stretch Limitations knee drives on 67TI step   ITB Stretch 3 reps;30 seconds;Limitations   ITB Stretch Limitations 10x3"   Gastroc Stretch 3 reps;30 seconds   Gastroc Stretch Limitations slant board   Knee/Hip Exercises: Standing   Forward Lunges Both;1 set;10 reps   Forward Lunges Limitations 2in step   Side Lunges Both;1 set;10 reps   Side Lunges Limitations 2in step   Terminal Knee Extension Right;20 reps;Theraband   Theraband Level (Terminal Knee Extension) Level 4 (Blue)   Terminal Knee Extension Limitations 2-3 second holds    Lateral Step Up Right;15 reps;Hand Hold: 1;Step  Height: 6"   Forward Step Up Both;15 reps;Hand Hold: 1;Step Height: 6"   Forward Step Up Limitations 6 inch box    Step Down Right;10 reps;Hand Hold: 1;Step Height: 4";5 reps   Step Down Limitations common single leg balance reach   Functional Squat 15 reps   Functional Squat Limitations 3D hip excursion   SLS Rt 40", Lt 36" max of 3   SLS with Vectors 3x 5"   Knee/Hip Exercises: Supine   Short Arc Quad Sets Both;20 reps   Knee Extension PROM;3 sets   Knee Extension Limitations 30" holds   Knee Flexion PROM;3 sets   Knee Flexion Limitations 30" holds   Manual Therapy   Manual Therapy Myofascial release;Joint mobilization   Joint Mobilization Fib/tib joint mobs grade III   Myofascial Release MFR to lateral Rt knee ITB; utilized the stick to Rt ITB; scar tissue massage to reduce adhesions             PT Short Term Goals - 05/19/14 4580    PT SHORT TERM GOAL #1   Title Patient will demonstrate 8 degrees extension R knee and at least 95 degrees flexion R knee   Status On-going   PT SHORT TERM GOAL #2   Title Patient will demonstrate at least 4/5 strength in all proximal muscles and in R leg in order to increase overall strength and stability    Status On-going   PT SHORT TERM GOAL #3   Title Patient will demonstrate the ability to stand for at least 45 minutes with pain no more than 2/10 pain and will be able to ambulate with cane at least 20 minutes and pain no more than 2/10   Status On-going   PT SHORT TERM GOAL #4   Title Patient will demonstrate bilateral hip IR of at least 38 degrees in order to reduce stress on bilateral knees and improve overall functional mechanics    Status On-going           PT Long Term Goals - 05/19/14 0818    PT LONG TERM GOAL #1   Title Patient will consistently and correctly be able to perform advanced HEP on an independent basis    PT LONG TERM GOAL #2   Title Patient will be able to sit and stand for unlimited amounts of time with no  assistive device, and will be able to walk unlimited distances without assistive device and pain no more than 1/10 R knee  PT LONG TERM GOAL #3   Title Patient will report that he has been able to return his fitness routine without difficulty or increased pain in R knee    PT LONG TERM GOAL #4   Title Patient will demonstrate no less than 3 degrees extension and 115 degrees flexion R knee to enhance ability to participate in functional tasks                Plan - 05/19/14 0848    Clinical Impression Statement Pt continues to demonstrate difficulty descending steps due to weak eccentric quad control and lacking full ROM.  Session focus on stretches and exercises to improve AROM as well as functional strengthening.  Resumed single leg reach  exercise to improve eccentric quad control.  Ended session wtih manual scar tissue massage over incision and MFR with lateral aspect of knee.  Pt continues to have 3 small openings around incision, reports are healing slowly though.  Noted improved fibular mobilty following joint mobs.   PT Next Visit Plan Continue functional exercises and stretches, elliptical and bike, stair training, knee ROM exercises. Check status of inflammation/edema in R knee.         Problem List Patient Active Problem List   Diagnosis Date Noted  . S/P right TKA 03/23/2014  . S/P knee replacement 03/23/2014  . Spinal stenosis of cervicothoracic region 10/08/2013  . RLS (restless legs syndrome) 10/08/2013  . DM (diabetes mellitus) type 2, uncontrolled, with ketoacidosis 10/08/2013  . Preoperative cardiovascular examination 08/14/2013  . Sleep apnea with use of continuous positive airway pressure (CPAP) 04/02/2013  . 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/02/2013  . Diabetic neuropathy 07/23/2012  . Obesity, morbid 05/01/2012  . Hypersomnia, persistent 05/01/2012  . OSA on CPAP 04/19/2012  . Essential hypertension, benign 12/16/2009  . CORONARY  ATHEROSCLEROSIS NATIVE CORONARY ARTERY 12/16/2009  . Type 2 diabetes mellitus, uncontrolled 11/30/2009  . HYPERLIPIDEMIA 11/30/2009   Ihor Austin, [place]; [place] #[id number] [phone]  Aldona Lento 05/19/2014, 9:10 AM  [place], [place], [address] Phone: [phone]   Fax:  [phone]

## 2014-05-20 DIAGNOSIS — I10 Essential (primary) hypertension: Secondary | ICD-10-CM | POA: Diagnosis not present

## 2014-05-20 DIAGNOSIS — Z713 Dietary counseling and surveillance: Secondary | ICD-10-CM | POA: Diagnosis not present

## 2014-05-20 DIAGNOSIS — E782 Mixed hyperlipidemia: Secondary | ICD-10-CM | POA: Diagnosis not present

## 2014-05-20 DIAGNOSIS — E1165 Type 2 diabetes mellitus with hyperglycemia: Secondary | ICD-10-CM | POA: Diagnosis not present

## 2014-05-22 ENCOUNTER — Ambulatory Visit (HOSPITAL_COMMUNITY): Payer: Medicare Other

## 2014-05-25 ENCOUNTER — Ambulatory Visit (HOSPITAL_COMMUNITY): Payer: Medicare Other

## 2014-05-25 DIAGNOSIS — M25669 Stiffness of unspecified knee, not elsewhere classified: Secondary | ICD-10-CM

## 2014-05-25 DIAGNOSIS — M6281 Muscle weakness (generalized): Secondary | ICD-10-CM

## 2014-05-25 DIAGNOSIS — Z96651 Presence of right artificial knee joint: Secondary | ICD-10-CM

## 2014-05-25 DIAGNOSIS — R269 Unspecified abnormalities of gait and mobility: Secondary | ICD-10-CM

## 2014-05-25 DIAGNOSIS — M25661 Stiffness of right knee, not elsewhere classified: Secondary | ICD-10-CM | POA: Diagnosis not present

## 2014-05-25 DIAGNOSIS — M25561 Pain in right knee: Secondary | ICD-10-CM | POA: Diagnosis not present

## 2014-05-25 DIAGNOSIS — Z471 Aftercare following joint replacement surgery: Secondary | ICD-10-CM | POA: Diagnosis not present

## 2014-05-25 NOTE — Therapy (Signed)
[place], [place], [address] Phone: [phone]   Fax:  [phone]  Physical Therapy Treatment  Patient Details  Name: Paul Baker MRN: [id number] Date of Birth: [birth year] Referring Provider:  Paralee Cancel, MD  Encounter Date: 05/25/2014      PT End of Session - 05/25/14 0904    Visit Number 14   Number of Visits 22   Date for PT Re-Evaluation 06/10/14   Authorization Type Medicare/UHC   Authorization Time Period 2014-05-13 to Jul 13, 2014; G-codes done 10th visit    Authorization - Visit Number 14   Authorization - Number of Visits 20   PT Start Time 0858   PT Stop Time 0932   PT Time Calculation (min) 34 min   Activity Tolerance Patient tolerated treatment well   Behavior During Therapy [facility] for tasks assessed/performed      Past Medical History  Diagnosis Date  . Arthritis   . Type 2 diabetes mellitus   . Mixed hyperlipidemia   . Essential hypertension, benign   . Septic arthritis of knee, left   . OSA (obstructive sleep apnea)   . Coronary atherosclerosis of native coronary artery     Diseased nondominant RCA  . Pulmonary embolism 2004  . DVT (deep venous thrombosis) 2005    RT ARM / SHOULDER   . Rotator cuff disorder     LEFT  . Colon polyp   . Morbid obesity   . RLS (restless legs syndrome)   . Hypersomnia     CPAP of 16 cm, diagnosed with AHI of 60 in 2012,epworth 21- narcolepsy?  [name] MRSA (methicillin resistant staph aureus) culture positive     08/2012  . Narcolepsy   . Complication of anesthesia 1991    DIFFICULTY WAKING UP "TEMP DROPPED" - NO PROBLEM SINCE WITH ANESTHESIA  . Asthma     NO RECENT PROBLEMS  . Headache   . Abnormal movement of lower extremity     ALSO UPPER EXTREMITIES - FOLLOWED BY DR. DOLMEYER  . History of nonmelanoma skin cancer   . Psoriasis   . History of transfusion   . PE (pulmonary embolism) 1982    AFTER KNEE ARTHROTOMY  . Hypothyroidism   . Cancer      HX SKION CANCER    Past Surgical History  Procedure Laterality Date  . Total knee arthroplasty  2003    LEFT  . Lumbar disc surgery      Left L3, L4, L5 discecotomy with decompression of L4 root  . Wrist surgery    . Total knee revision  2005    LEFT  . Colonoscopy    . Back surgery    . Knee surgery      X 25 TIMES  . Total knee arthroplasty Right 03/23/2014    Procedure: RIGHT TOTAL KNEE ARTHROPLASTY AND REMOVAL RIGHT TIBIAL  DEEP IMPLANT STAPLE;  Surgeon: Paralee Cancel, MD;  Location: [facility];  Service: Orthopedics;  Laterality: Right;    There were no vitals filed for this visit.  Visit Diagnosis:  Right knee pain  Status post right knee replacement  Decreased range of motion (ROM) of knee  Abnormality of gait  Proximal muscle weakness      Subjective Assessment - 05/25/14 0858    Subjective Pt stated he over did it this weekend, stated he is sore under patella.   Currently in Pain? Yes   Pain Score 5    Pain Location Knee  Pain Orientation Right;Anterior   Pain Descriptors / Indicators Sore                         [facility] Adult PT Treatment/Exercise - 05/25/14 0001    Exercises   Exercises Knee/Hip   Knee/Hip Exercises: Stretches   Active Hamstring Stretch 3 reps;30 seconds   Active Hamstring Stretch Limitations 14 inch step    Quad Stretch 3 reps;30 seconds   Quad Stretch Limitations prone with rope    Knee: Self-Stretch Limitations knee drives on 18EX step   ITB Stretch 3 reps;30 seconds;Limitations   Gastroc Stretch 3 reps;30 seconds   Gastroc Stretch Limitations slant board   Knee/Hip Exercises: Standing   Terminal Knee Extension Right;20 reps;Theraband   Theraband Level (Terminal Knee Extension) Level 4 (Blue)   Terminal Knee Extension Limitations 2-3 second holds    Other Standing Knee Exercises 3D hip excursion split stance   Manual Therapy   Manual Therapy Myofascial release;Joint mobilization   Joint Mobilization Fib/tib  joint mobs grade III   Myofascial Release MFR to lateral Rt knee ITB; utilized the stick to Rt ITB; scar tissue massage to reduce adhesions                  PT Short Term Goals - 05/25/14 0905    PT SHORT TERM GOAL #1   Title Patient will demonstrate 8 degrees extension R knee and at least 95 degrees flexion R knee   Status On-going   PT SHORT TERM GOAL #2   Title Patient will demonstrate at least 4/5 strength in all proximal muscles and in R leg in order to increase overall strength and stability    Status On-going   PT SHORT TERM GOAL #3   Title Patient will demonstrate the ability to stand for at least 45 minutes with pain no more than 2/10 pain and will be able to ambulate with cane at least 20 minutes and pain no more than 2/10   Status On-going   PT SHORT TERM GOAL #4   Title Patient will demonstrate bilateral hip IR of at least 38 degrees in order to reduce stress on bilateral knees and improve overall functional mechanics    Status On-going           PT Long Term Goals - 05/25/14 0905    PT LONG TERM GOAL #1   Title Patient will consistently and correctly be able to perform advanced HEP on an independent basis    Baseline Reports compliance daily   Status Achieved   PT LONG TERM GOAL #2   Title Patient will be able to sit and stand for unlimited amounts of time with no assistive device, and will be able to walk unlimited distances without assistive device and pain no more than 1/10 R knee    PT LONG TERM GOAL #3   Title Patient will report that he has been able to return his fitness routine without difficulty or increased pain in R knee    PT LONG TERM GOAL #4   Title Patient will demonstrate no less than 3 degrees extension and 115 degrees flexion R knee to enhance ability to participate in functional tasks             Plan - 05/25/14 0911    Clinical Impression Statement Pt late for apt today.  Session focus on improving overall hip and knee mobilty to  reduce soreness and tightness and to improve  AROM primarly knee extension.  Continues stretches and mobility exercises with min cueing for form.  Increased manual time to reduce pain and improve flexibilty.  Pt stated pain reduced at end of session with improved gait mechanics following.     PT Next Visit Plan Continue functional exercises and stretches, elliptical and bike, stair training, knee ROM exercises. Check status of inflammation/edema in R knee.         Problem List Patient Active Problem List   Diagnosis Date Noted  . S/P right TKA 03/23/2014  . S/P knee replacement 03/23/2014  . Spinal stenosis of cervicothoracic region 10/08/2013  . RLS (restless legs syndrome) 10/08/2013  . DM (diabetes mellitus) type 2, uncontrolled, with ketoacidosis 10/08/2013  . Preoperative cardiovascular examination 08/14/2013  . Sleep apnea with use of continuous positive airway pressure (CPAP) 04/02/2013  . 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/02/2013  . Diabetic neuropathy 07/23/2012  . Obesity, morbid 05/01/2012  . Hypersomnia, persistent 05/01/2012  . OSA on CPAP 04/19/2012  . Essential hypertension, benign 12/16/2009  . CORONARY ATHEROSCLEROSIS NATIVE CORONARY ARTERY 12/16/2009  . Type 2 diabetes mellitus, uncontrolled 11/30/2009  . HYPERLIPIDEMIA 11/30/2009   Ihor Austin, [place]; [place] #[id number] [phone]  Aldona Lento 05/25/2014, 1:35 PM  [facility] [address] [place], [place], [address] Phone: [phone]   Fax:  [phone]

## 2014-05-26 DIAGNOSIS — E6609 Other obesity due to excess calories: Secondary | ICD-10-CM | POA: Diagnosis not present

## 2014-05-26 DIAGNOSIS — E1165 Type 2 diabetes mellitus with hyperglycemia: Secondary | ICD-10-CM | POA: Diagnosis not present

## 2014-05-26 DIAGNOSIS — I10 Essential (primary) hypertension: Secondary | ICD-10-CM | POA: Diagnosis not present

## 2014-05-26 DIAGNOSIS — E782 Mixed hyperlipidemia: Secondary | ICD-10-CM | POA: Diagnosis not present

## 2014-05-27 ENCOUNTER — Ambulatory Visit (HOSPITAL_COMMUNITY): Payer: Medicare Other | Admitting: Physical Therapy

## 2014-05-27 DIAGNOSIS — R269 Unspecified abnormalities of gait and mobility: Secondary | ICD-10-CM

## 2014-05-27 DIAGNOSIS — Z471 Aftercare following joint replacement surgery: Secondary | ICD-10-CM | POA: Diagnosis not present

## 2014-05-27 DIAGNOSIS — M6281 Muscle weakness (generalized): Secondary | ICD-10-CM

## 2014-05-27 DIAGNOSIS — Z96651 Presence of right artificial knee joint: Secondary | ICD-10-CM | POA: Diagnosis not present

## 2014-05-27 DIAGNOSIS — M25561 Pain in right knee: Secondary | ICD-10-CM

## 2014-05-27 DIAGNOSIS — M25669 Stiffness of unspecified knee, not elsewhere classified: Secondary | ICD-10-CM

## 2014-05-27 DIAGNOSIS — M25661 Stiffness of right knee, not elsewhere classified: Secondary | ICD-10-CM | POA: Diagnosis not present

## 2014-05-27 NOTE — Therapy (Signed)
[place], [place], [address] Phone: [phone]   Fax:  [phone]  Physical Therapy Treatment  Patient Details  Name: Paul Baker MRN: [id number] Date of Birth: 1950/07/04 Referring Provider:  Paralee Cancel, MD  Encounter Date: 05/27/2014      PT End of Session - 05/27/14 1030    Visit Number 15   Number of Visits 22   Date for PT Re-Evaluation 06/10/14   Authorization Type Medicare/UHC   Authorization Time Period 2014/04/15 to 2014-06-15; G-codes done 10th visit    Authorization - Visit Number 15   Authorization - Number of Visits 20   PT Start Time [phone]   PT Stop Time 1022   PT Time Calculation (min) 29 min   Activity Tolerance Patient tolerated treatment well  Mild but tolerable increases in pain with 8" anterio adn later step ups   Behavior During Therapy [facility] for tasks assessed/performed      Past Medical History  Diagnosis Date  . Arthritis   . Type 2 diabetes mellitus   . Mixed hyperlipidemia   . Essential hypertension, benign   . Septic arthritis of knee, left   . OSA (obstructive sleep apnea)   . Coronary atherosclerosis of native coronary artery     Diseased nondominant RCA  . Pulmonary embolism 2004  . DVT (deep venous thrombosis) 2005    RT ARM / SHOULDER   . Rotator cuff disorder     LEFT  . Colon polyp   . Morbid obesity   . RLS (restless legs syndrome)   . Hypersomnia     CPAP of 16 cm, diagnosed with AHI of 60 in 2012,epworth 21- narcolepsy?  [name] MRSA (methicillin resistant staph aureus) culture positive     08/2012  . Narcolepsy   . Complication of anesthesia 1991    DIFFICULTY WAKING UP "TEMP DROPPED" - NO PROBLEM SINCE WITH ANESTHESIA  . Asthma     NO RECENT PROBLEMS  . Headache   . Abnormal movement of lower extremity     ALSO UPPER EXTREMITIES - FOLLOWED BY DR. DOLMEYER  . History of nonmelanoma skin cancer   . Psoriasis   . History of transfusion   . PE (pulmonary  embolism) 1982    AFTER KNEE ARTHROTOMY  . Hypothyroidism   . Cancer     HX SKION CANCER    Past Surgical History  Procedure Laterality Date  . Total knee arthroplasty  2003    LEFT  . Lumbar disc surgery      Left L3, L4, L5 discecotomy with decompression of L4 root  . Wrist surgery    . Total knee revision  2005    LEFT  . Colonoscopy    . Back surgery    . Knee surgery      X 25 TIMES  . Total knee arthroplasty Right 03/23/2014    Procedure: RIGHT TOTAL KNEE ARTHROPLASTY AND REMOVAL RIGHT TIBIAL  DEEP IMPLANT STAPLE;  Surgeon: Paralee Cancel, MD;  Location: [facility];  Service: Orthopedics;  Laterality: Right;    There were no vitals filed for this visit.  Visit Diagnosis:  Right knee pain  Status post right knee replacement  Decreased range of motion (ROM) of knee  Abnormality of gait  Proximal muscle weakness      Subjective Assessment - 05/27/14 0955    Subjective Pt reports knee continued soreness 3/10. R lateral quads/IT area sore with single leg high squat activies (  getting into high vehicles)   Pertinent History Patient states that he has had a large amount of knee surgeries in the past, including multiple surgeries to repair cartilage and ligaments. Patient states that his right knee had become completely worn out with meniscus tears all the way around, very painful and eventually required replacement.  States his L knee replacement took a long time to fully recover due to complications with infection and that he never really got full range back in his L knee.    Currently in Pain? Yes   Pain Score 3    Pain Location Knee   Pain Orientation Right   Pain Descriptors / Indicators Discomfort   Pain Type Chronic pain                         [facility] Adult PT Treatment/Exercise - 05/27/14 0001    Knee/Hip Exercises: Stretches   Active Hamstring Stretch 3 reps;30 seconds   Active Hamstring Stretch Limitations 14 inch step    Quad Stretch 3 reps;30  seconds   Quad Stretch Limitations prone with rope    ITB Stretch 3 reps;30 seconds;Limitations   Gastroc Stretch 3 reps;30 seconds   Gastroc Stretch Limitations slant board   Knee/Hip Exercises: Standing   Terminal Knee Extension Right;20 reps;Theraband   Theraband Level (Terminal Knee Extension) Level 4 (Blue)   Terminal Knee Extension Limitations 2-3 second holds    Lateral Step Up Right;15 reps;Hand Hold: 1;Step Height: 6"   Lateral Step Up Limitations 8" step    Forward Step Up Both;Step Height: 6";10 reps;2 sets;Hand Hold: 0   Forward Step Up Limitations 8 inch box                   PT Short Term Goals - 05/25/14 0905    PT SHORT TERM GOAL #1   Title Patient will demonstrate 8 degrees extension R knee and at least 95 degrees flexion R knee   Status On-going   PT SHORT TERM GOAL #2   Title Patient will demonstrate at least 4/5 strength in all proximal muscles and in R leg in order to increase overall strength and stability    Status On-going   PT SHORT TERM GOAL #3   Title Patient will demonstrate the ability to stand for at least 45 minutes with pain no more than 2/10 pain and will be able to ambulate with cane at least 20 minutes and pain no more than 2/10   Status On-going   PT SHORT TERM GOAL #4   Title Patient will demonstrate bilateral hip IR of at least 38 degrees in order to reduce stress on bilateral knees and improve overall functional mechanics    Status On-going           PT Long Term Goals - 05/25/14 0905    PT LONG TERM GOAL #1   Title Patient will consistently and correctly be able to perform advanced HEP on an independent basis    Baseline Reports compliance daily   Status Achieved   PT LONG TERM GOAL #2   Title Patient will be able to sit and stand for unlimited amounts of time with no assistive device, and will be able to walk unlimited distances without assistive device and pain no more than 1/10 R knee    PT LONG TERM GOAL #3   Title  Patient will report that he has been able to return his fitness routine without difficulty or  increased pain in R knee    PT LONG TERM GOAL #4   Title Patient will demonstrate no less than 3 degrees extension and 115 degrees flexion R knee to enhance ability to participate in functional tasks                Plan - 05/27/14 1033    Clinical Impression Statement Pt remains on track with HEP, citing regular gym visits. Pt continues to c/o pain with loaded or resisted knee fleixon over 70 degrees flexion.  Pt continues to present with limited ROM, weakness, and joint effusion and will benefit from skilled intervention to restore to PLOF.    Pt will benefit from skilled therapeutic intervention in order to improve on the following deficits Abnormal gait;Decreased coordination;Decreased range of motion;Difficulty walking;Impaired flexibility;Improper body mechanics;Postural dysfunction;Increased edema;Decreased endurance;Decreased activity tolerance;Decreased skin integrity;Decreased balance;Decreased scar mobility;Pain;Decreased mobility;Decreased strength   Rehab Potential Good   PT Frequency 3x / week   PT Duration 4 weeks   PT Next Visit Plan Continue functional exercises and stretches, elliptical and bike, stair training, knee ROM exercises. Check status of inflammation/edema in R knee and incorporated more soft tissue mobilization around anteromedial knee.    PT Home Exercise Plan Up to date. No questions at this time.   Consulted and Agree with Plan of Care Patient        Problem List Patient Active Problem List   Diagnosis Date Noted  . S/P right TKA 03/23/2014  . S/P knee replacement 03/23/2014  . Spinal stenosis of cervicothoracic region 10/08/2013  . RLS (restless legs syndrome) 10/08/2013  . DM (diabetes mellitus) type 2, uncontrolled, with ketoacidosis 10/08/2013  . Preoperative cardiovascular examination 08/14/2013  . Sleep apnea with use of continuous positive airway  pressure (CPAP) 04/02/2013  . 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/02/2013  . Diabetic neuropathy 07/23/2012  . Obesity, morbid 05/01/2012  . Hypersomnia, persistent 05/01/2012  . OSA on CPAP 04/19/2012  . Essential hypertension, benign 12/16/2009  . CORONARY ATHEROSCLEROSIS NATIVE CORONARY ARTERY 12/16/2009  . Type 2 diabetes mellitus, uncontrolled 11/30/2009  . HYPERLIPIDEMIA 11/30/2009    Rebbeca Paul, PT, DPT [place]# [id number]  05/27/2014 3:27 PM  Deniece Ree PT, DPT [phone]   [facility] [address] [place], [place], [address] Phone: [phone]   Fax:  [phone]

## 2014-05-28 ENCOUNTER — Ambulatory Visit: Payer: Medicare Other | Admitting: Nutrition

## 2014-05-28 ENCOUNTER — Other Ambulatory Visit: Payer: Self-pay | Admitting: Family Medicine

## 2014-05-28 ENCOUNTER — Other Ambulatory Visit: Payer: Self-pay | Admitting: Cardiology

## 2014-05-28 NOTE — Telephone Encounter (Signed)
Needs office visit.

## 2014-05-29 ENCOUNTER — Ambulatory Visit (HOSPITAL_COMMUNITY): Payer: Medicare Other

## 2014-05-29 DIAGNOSIS — E291 Testicular hypofunction: Secondary | ICD-10-CM | POA: Diagnosis not present

## 2014-06-02 ENCOUNTER — Encounter (HOSPITAL_COMMUNITY): Payer: Medicare Other | Admitting: Physical Therapy

## 2014-06-03 ENCOUNTER — Ambulatory Visit (HOSPITAL_COMMUNITY): Payer: Medicare Other | Attending: Orthopedic Surgery

## 2014-06-03 DIAGNOSIS — M25561 Pain in right knee: Secondary | ICD-10-CM | POA: Insufficient documentation

## 2014-06-03 DIAGNOSIS — Z471 Aftercare following joint replacement surgery: Secondary | ICD-10-CM | POA: Insufficient documentation

## 2014-06-03 DIAGNOSIS — M25669 Stiffness of unspecified knee, not elsewhere classified: Secondary | ICD-10-CM

## 2014-06-03 DIAGNOSIS — R269 Unspecified abnormalities of gait and mobility: Secondary | ICD-10-CM

## 2014-06-03 DIAGNOSIS — M25661 Stiffness of right knee, not elsewhere classified: Secondary | ICD-10-CM | POA: Diagnosis not present

## 2014-06-03 DIAGNOSIS — M6281 Muscle weakness (generalized): Secondary | ICD-10-CM

## 2014-06-03 DIAGNOSIS — Z96651 Presence of right artificial knee joint: Secondary | ICD-10-CM

## 2014-06-03 NOTE — Therapy (Signed)
[place], [place], [address] Phone: [phone]   Fax:  [phone]  Physical Therapy Treatment  Patient Details  Name: Paul Baker MRN: [id number] Date of Birth: Nov 15, 1950 Referring Provider:  Paralee Cancel, MD  Encounter Date: 06/03/2014      PT End of Session - 06/03/14 0917    Visit Number 16   Number of Visits 22   Date for PT Re-Evaluation 06/10/14   Authorization Type Medicare/UHC   Authorization Time Period 2014/04/20 to 2014/06/20; G-codes done 10th visit    Authorization - Visit Number 16   Authorization - Number of Visits 20   PT Start Time 0911   PT Stop Time 0943   PT Time Calculation (min) 32 min   Activity Tolerance Patient tolerated treatment well   Behavior During Therapy [facility] for tasks assessed/performed      Past Medical History  Diagnosis Date  . Arthritis   . Type 2 diabetes mellitus   . Mixed hyperlipidemia   . Essential hypertension, benign   . Septic arthritis of knee, left   . OSA (obstructive sleep apnea)   . Coronary atherosclerosis of native coronary artery     Diseased nondominant RCA  . Pulmonary embolism 2004  . DVT (deep venous thrombosis) 2005    RT ARM / SHOULDER   . Rotator cuff disorder     LEFT  . Colon polyp   . Morbid obesity   . RLS (restless legs syndrome)   . Hypersomnia     CPAP of 16 cm, diagnosed with AHI of 60 in 2012,epworth 21- narcolepsy?  [name] MRSA (methicillin resistant staph aureus) culture positive     08/2012  . Narcolepsy   . Complication of anesthesia 1991    DIFFICULTY WAKING UP "TEMP DROPPED" - NO PROBLEM SINCE WITH ANESTHESIA  . Asthma     NO RECENT PROBLEMS  . Headache   . Abnormal movement of lower extremity     ALSO UPPER EXTREMITIES - FOLLOWED BY DR. DOLMEYER  . History of nonmelanoma skin cancer   . Psoriasis   . History of transfusion   . PE (pulmonary embolism) 1982    AFTER KNEE ARTHROTOMY  . Hypothyroidism   . Cancer      HX SKION CANCER    Past Surgical History  Procedure Laterality Date  . Total knee arthroplasty  2003    LEFT  . Lumbar disc surgery      Left L3, L4, L5 discecotomy with decompression of L4 root  . Wrist surgery    . Total knee revision  2005    LEFT  . Colonoscopy    . Back surgery    . Knee surgery      X 25 TIMES  . Total knee arthroplasty Right 03/23/2014    Procedure: RIGHT TOTAL KNEE ARTHROPLASTY AND REMOVAL RIGHT TIBIAL  DEEP IMPLANT STAPLE;  Surgeon: Paralee Cancel, MD;  Location: [facility];  Service: Orthopedics;  Laterality: Right;    There were no vitals filed for this visit.  Visit Diagnosis:  Right knee pain  Status post right knee replacement  Decreased range of motion (ROM) of knee  Abnormality of gait  Proximal muscle weakness      Subjective Assessment - 06/03/14 0914    Subjective Pt stated knee is feeling better, continues to have difficult task descending stairs.  Pt reported complaince with HEP and going to gym.  Already rode bicycle today for 12 minutes  prior apt.     Currently in Pain? Yes   Pain Score 2    Pain Location Knee   Pain Orientation Right   Pain Descriptors / Indicators Discomfort            [facility] PT Assessment - 06/03/14 0001    Assessment   Medical Diagnosis R Total knee replacement    Onset Date/Surgical Date 03/23/14   Precautions   Precautions None   Precaution Comments WBAT               [facility] Adult PT Treatment/Exercise - 06/03/14 0001    Exercises   Exercises Knee/Hip   Knee/Hip Exercises: Stretches   Active Hamstring Stretch 3 reps;30 seconds   Active Hamstring Stretch Limitations 14 inch step    Quad Stretch 3 reps;30 seconds   Quad Stretch Limitations prone with rope    ITB Stretch 3 reps;30 seconds;Limitations   Gastroc Stretch 3 reps;30 seconds   Gastroc Stretch Limitations slant board   Knee/Hip Exercises: Standing   Terminal Knee Extension Right;20 reps;Theraband   Theraband Level (Terminal  Knee Extension) Level 4 (Blue)   Terminal Knee Extension Limitations 2-3 second holds    Lateral Step Up Right;15 reps;Hand Hold: 1;Step Height: 6"   Forward Step Up Right;15 reps;Hand Hold: 1;Step Height: 8"   Step Down Right;15 reps;Hand Hold: 1;Step Height: 4"   Other Standing Knee Exercises 3D hip excursion split stance   Knee/Hip Exercises: Supine   Short Arc Quad Sets Both;20 reps   Manual Therapy   Manual Therapy Myofascial release;Joint mobilization   Joint Mobilization Fib/tib joint mobs grade III   Myofascial Release MFR to lateral Rt knee ITB; utilized the stick to Rt ITB; scar tissue massage to reduce adhesions                  PT Short Term Goals - 06/03/14 2831    PT SHORT TERM GOAL #1   Title Patient will demonstrate 8 degrees extension R knee and at least 95 degrees flexion R knee   Status On-going   PT SHORT TERM GOAL #2   Title Patient will demonstrate at least 4/5 strength in all proximal muscles and in R leg in order to increase overall strength and stability    Status On-going   PT SHORT TERM GOAL #3   Title Patient will demonstrate the ability to stand for at least 45 minutes with pain no more than 2/10 pain and will be able to ambulate with cane at least 20 minutes and pain no more than 2/10   Status On-going   PT SHORT TERM GOAL #4   Title Patient will demonstrate bilateral hip IR of at least 38 degrees in order to reduce stress on bilateral knees and improve overall functional mechanics    Status On-going           PT Long Term Goals - 06/03/14 5176    PT LONG TERM GOAL #1   Title Patient will consistently and correctly be able to perform advanced HEP on an independent basis    Status Achieved   PT LONG TERM GOAL #2   Title Patient will be able to sit and stand for unlimited amounts of time with no assistive device, and will be able to walk unlimited distances without assistive device and pain no more than 1/10 R knee    Status On-going   PT  LONG TERM GOAL #3   Title Patient will report that he has  been able to return his fitness routine without difficulty or increased pain in R knee    Status On-going   PT LONG TERM GOAL #4   Title Patient will demonstrate no less than 3 degrees extension and 115 degrees flexion R knee to enhance ability to participate in functional tasks                Plan - 06/03/14 1127    Clinical Impression Statement Session focus on improving ROM and functional strengthening.  Pt continues to have adhesions following several surgeries to knees limiting his ROM.  Emphasis on quad strengthening this session to improve knee extension and stretches to increase flexibilty.  AROM today 11-113 degrees following manual myofascial release.  Resumed stair training with most difficulty descending stairs due to weak eccentric control, cueikng for control.  Pt stated pain reduced at end of session.     PT Next Visit Plan Continue functional exercises and stretches, elliptical and bike, stair training, knee ROM exercises. Check status of inflammation/edema in R knee and incorporated more soft tissue mobilization around anteromedial knee.         Problem List Patient Active Problem List   Diagnosis Date Noted  . S/P right TKA 03/23/2014  . S/P knee replacement 03/23/2014  . Spinal stenosis of cervicothoracic region 10/08/2013  . RLS (restless legs syndrome) 10/08/2013  . DM (diabetes mellitus) type 2, uncontrolled, with ketoacidosis 10/08/2013  . Preoperative cardiovascular examination 08/14/2013  . Sleep apnea with use of continuous positive airway pressure (CPAP) 04/02/2013  . 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/02/2013  . Diabetic neuropathy 07/23/2012  . Obesity, morbid 05/01/2012  . Hypersomnia, persistent 05/01/2012  . OSA on CPAP 04/19/2012  . Essential hypertension, benign 12/16/2009  . CORONARY ATHEROSCLEROSIS NATIVE CORONARY ARTERY 12/16/2009  . Type 2 diabetes mellitus,  uncontrolled 11/30/2009  . HYPERLIPIDEMIA 11/30/2009   Aldona Lento, PTA  Aldona Lento 06/03/2014, 11:34 AM  [place], [place], [address] Phone: [phone]   Fax:  [phone]

## 2014-06-04 ENCOUNTER — Other Ambulatory Visit: Payer: Self-pay | Admitting: Family Medicine

## 2014-06-05 ENCOUNTER — Ambulatory Visit (HOSPITAL_COMMUNITY): Payer: Medicare Other | Admitting: Physical Therapy

## 2014-06-05 DIAGNOSIS — R269 Unspecified abnormalities of gait and mobility: Secondary | ICD-10-CM

## 2014-06-05 DIAGNOSIS — M25669 Stiffness of unspecified knee, not elsewhere classified: Secondary | ICD-10-CM

## 2014-06-05 DIAGNOSIS — M25561 Pain in right knee: Secondary | ICD-10-CM | POA: Diagnosis not present

## 2014-06-05 DIAGNOSIS — Z471 Aftercare following joint replacement surgery: Secondary | ICD-10-CM | POA: Diagnosis not present

## 2014-06-05 DIAGNOSIS — M6281 Muscle weakness (generalized): Secondary | ICD-10-CM

## 2014-06-05 DIAGNOSIS — M25661 Stiffness of right knee, not elsewhere classified: Secondary | ICD-10-CM | POA: Diagnosis not present

## 2014-06-05 DIAGNOSIS — Z96651 Presence of right artificial knee joint: Secondary | ICD-10-CM

## 2014-06-05 NOTE — Therapy (Signed)
[place], [place], [address] Phone: [phone]   Fax:  [phone]  Physical Therapy Treatment (Re-Assess/Re-Cert)  Patient Details  Name: Paul Baker MRN: [id number] Date of Birth: [birth year] Referring Provider:  Paralee Cancel, MD  Encounter Date: 06/05/2014      PT End of Session - 06/05/14 0848    Visit Number 17   Number of Visits 22   Date for PT Re-Evaluation 07/03/14   Authorization Type Medicare/[employer]   Authorization Time Period 05-04-2014 to 07/04/2014; G-codes done April 09, 2022 visit    Authorization - Visit Number 17   Authorization - Number of Visits 20   PT Start Time 0806   PT Stop Time 0846   PT Time Calculation (min) 40 min   Activity Tolerance Patient tolerated treatment well   Behavior During Therapy [facility] for tasks assessed/performed      Past Medical History  Diagnosis Date  . Arthritis   . Type 2 diabetes mellitus   . Mixed hyperlipidemia   . Essential hypertension, benign   . Septic arthritis of knee, left   . OSA (obstructive sleep apnea)   . Coronary atherosclerosis of native coronary artery     Diseased nondominant RCA  . Pulmonary embolism 2004  . DVT (deep venous thrombosis) 2005    RT ARM / SHOULDER   . Rotator cuff disorder     LEFT  . Colon polyp   . Morbid obesity   . RLS (restless legs syndrome)   . Hypersomnia     CPAP of 16 cm, diagnosed with AHI of 60 in 2012,epworth 21- narcolepsy?  [name] MRSA (methicillin resistant staph aureus) culture positive     08/2012  . Narcolepsy   . Complication of anesthesia 1991    DIFFICULTY WAKING UP "TEMP DROPPED" - NO PROBLEM SINCE WITH ANESTHESIA  . Asthma     NO RECENT PROBLEMS  . Headache   . Abnormal movement of lower extremity     ALSO UPPER EXTREMITIES - FOLLOWED BY DR. DOLMEYER  . History of nonmelanoma skin cancer   . Psoriasis   . History of transfusion   . PE (pulmonary embolism) 1982    AFTER KNEE ARTHROTOMY  .  Hypothyroidism   . Cancer     HX SKION CANCER    Past Surgical History  Procedure Laterality Date  . Total knee arthroplasty  2003    LEFT  . Lumbar disc surgery      Left L3, L4, L5 discecotomy with decompression of L4 root  . Wrist surgery    . Total knee revision  2005    LEFT  . Colonoscopy    . Back surgery    . Knee surgery      X 25 TIMES  . Total knee arthroplasty Right 03/23/2014    Procedure: RIGHT TOTAL KNEE ARTHROPLASTY AND REMOVAL RIGHT TIBIAL  DEEP IMPLANT STAPLE;  Surgeon: Paralee Cancel, MD;  Location: [facility];  Service: Orthopedics;  Laterality: Right;    There were no vitals filed for this visit.  Visit Diagnosis:  Right knee pain - Plan: PT plan of care cert/re-cert  Status post right knee replacement - Plan: PT plan of care cert/re-cert  Decreased range of motion (ROM) of knee - Plan: PT plan of care cert/re-cert  Abnormality of gait - Plan: PT plan of care cert/re-cert  Proximal muscle weakness - Plan: PT plan of care cert/re-cert      Subjective Assessment - 06/05/14  0808    Subjective Patietn reports that he is a little sore from walking a lot yesterday, states he does feel like heel-toe gait pattern is becoming more natural    Pertinent History Patient states that he has had a large amount of knee surgeries in the past, including multiple surgeries to repair cartilage and ligaments. Patient states that his right knee had become completely worn out with meniscus tears all the way around, very painful and eventually required replacement.  States his L knee replacement took a long time to fully recover due to complications with infection and that he never really got full range back in his L knee.    Currently in Pain? Yes   Pain Score 3    Pain Location Knee   Pain Orientation Right            [facility] PT Assessment - 06/05/14 0001    Assessment   Medical Diagnosis R Total knee replacement    Onset Date/Surgical Date 03/23/14   Next MD Visit In 10  months    Precautions   Precautions None   Precaution Comments WBAT   Prior Function   Level of Independence Independent with basic ADLs;Independent with homemaking with ambulation;Independent with gait;Independent with transfers   Vocation Retired   Leisure Surveyor, mining, water tubing    Observation/Other Assessments   Focus on Therapeutic Outcomes (FOTO)  52% limited    AROM   Right Hip External Rotation  40   Right Hip Internal Rotation  39   Left Hip External Rotation  43   Left Hip Internal Rotation  38   Right Knee Extension 7  with ankle on towel roll    Right Knee Flexion 111   Strength   Right Hip Flexion 4/5   Right Hip Extension 4/5   Right Hip ABduction 4/5   Left Hip Flexion 4-/5   Left Hip Extension 4/5   Left Hip ABduction 4+/5   Right Knee Flexion 5/5   Right Knee Extension 5/5   Left Knee Flexion 5/5   Left Knee Extension 5/5   Right Ankle Dorsiflexion 5/5   Left Ankle Dorsiflexion 5/5                     [facility] Adult PT Treatment/Exercise - 06/05/14 0001    Knee/Hip Exercises: Stretches   Active Hamstring Stretch 3 reps;30 seconds   Active Hamstring Stretch Limitations 14 inch step, 3 way    Sports administrator --   Sports administrator Limitations --   ITB Stretch --   ITB Stretch Limitations --   Gastroc Stretch 3 reps;30 seconds   Gastroc Stretch Limitations slant board   Knee/Hip Exercises: Aerobic   Stationary Bike 10 minutes seat 9, full rotations                 PT Education - 06/05/14 [phone]    Education provided Yes   Education Details progress with skilled PT services, plan of care moving forward, educated that he may progress exercise at gym as tolerated but should reduce activity if he experiences increased pain or edema    Person(s) Educated Patient   Methods Explanation   Comprehension Verbalized understanding          PT Short Term Goals - 06/05/14 0839    PT SHORT TERM GOAL #1   Title Patient will demonstrate 8  degrees extension R knee and at least 95 degrees flexion R knee   Baseline  6/3- able to get to 7 degrees extenstion with towel roll under ankle today, 111 flexion    Time 2   Period Weeks   Status Achieved   PT SHORT TERM GOAL #2   Title Patient will demonstrate at least 4/5 strength in all proximal muscles and in R leg in order to increase overall strength and stability    Time 2   Period Weeks   Status On-going   PT SHORT TERM GOAL #3   Title Patient will demonstrate the ability to stand for at least 45 minutes with pain no more than 2/10 pain and will be able to ambulate with cane at least 20 minutes and pain no more than 2/10   Baseline 6/3- not able to stand 45 minutes, but is now ambulating without cane    Time 2   Period Weeks   Status On-going   PT SHORT TERM GOAL #4   Title Patient will demonstrate bilateral hip IR of at least 38 degrees in order to reduce stress on bilateral knees and improve overall functional mechanics    Time 2   Period Weeks   Status Achieved           PT Long Term Goals - 06/05/14 0840    PT LONG TERM GOAL #1   Title Patient will consistently and correctly be able to perform advanced HEP on an independent basis    Baseline Reports compliance daily   Time 4   Period Weeks   Status Achieved   PT LONG TERM GOAL #2   Title Patient will be able to sit and stand for unlimited amounts of time with no assistive device, and will be able to walk unlimited distances without assistive device and pain no more than 1/10 R knee    Time 4   Period Weeks   Status On-going   PT LONG TERM GOAL #3   Title Patient will report that he has been able to return his fitness routine, including being able to ride a standard road bike for approximately 5 miles, without difficulty or increased pain in R knee    Time 4   Period Weeks   Status Revised   PT LONG TERM GOAL #4   Title Patient will demonstrate no less than 3 degrees extension and 115 degrees flexion R knee to  enhance ability to participate in functional tasks    Time 4   Period Weeks   Status On-going   PT LONG TERM GOAL #5   Title Patient will be able to efficiently descend stairs with reciprocal gait pattern with no railing and pain R knee no more than 1/10   Time 4   Period Weeks   Status New               Plan - 06/05/14 0850    Clinical Impression Statement Re-assessment performed today. Patient demonstrates improving strength and ROM at this time, however does continue to be limited by reduced overall R knee ROM, reduced proximal muscle strength, reduced tolerance to extended static standing and gait based tasks, R knee pain and edema, reduced soft tissue mobiltiy surrounding R knee, difficulty returning to his normal workout routine, and difficulty descending stairs. Patient states that he can tell  he is improving but continues to note that he does have trouble with functional exercise tasks and on stairs. Patient will benefit from continued skilled PT services, for approximately 4 more weeks at 3x/week, in order to address these deficits  and assist him in reaching an optimal level of function.    Pt will benefit from skilled therapeutic intervention in order to improve on the following deficits Abnormal gait;Decreased coordination;Decreased range of motion;Difficulty walking;Impaired flexibility;Improper body mechanics;Postural dysfunction;Increased edema;Decreased endurance;Decreased activity tolerance;Decreased skin integrity;Decreased balance;Decreased scar mobility;Pain;Decreased mobility;Decreased strength   Rehab Potential Good   PT Frequency 3x / week   PT Duration 4 weeks   PT Treatment/Interventions ADLs/Self Care Home Management;Gait training;Neuromuscular re-education;Stair training;Scar mobilization;Functional mobility training;Patient/family education;Cryotherapy;Therapeutic activities;Therapeutic exercise;Manual techniques;Moist Heat;Balance training   PT Next Visit Plan  Continue functional exercises and stretches, elliptical and bike, stair training, knee ROM exercises. Check status of inflammation/edema in R knee and incorporated more soft tissue mobilization around anteromedial knee.    PT Home Exercise Plan Up to date. No questions at this time.   Consulted and Agree with Plan of Care Patient          G-Codes - 06/14/2014 0901    Functional Assessment Tool Used FOTO    Functional Limitation Mobility: Walking and moving around   Mobility: Walking and Moving Around Current Status [phone]) At least 40 percent but less than 60 percent impaired, limited or restricted   Mobility: Walking and Moving Around Goal Status [phone]) At least 20 percent but less than 40 percent impaired, limited or restricted      Problem List Patient Active Problem List   Diagnosis Date Noted  . S/P right TKA 03/23/2014  . S/P knee replacement 03/23/2014  . Spinal stenosis of cervicothoracic region 10/08/2013  . RLS (restless legs syndrome) 10/08/2013  . DM (diabetes mellitus) type 2, uncontrolled, with ketoacidosis 10/08/2013  . Preoperative cardiovascular examination 08/14/2013  . Sleep apnea with use of continuous positive airway pressure (CPAP) 04/02/2013  . 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/02/2013  . Diabetic neuropathy 07/23/2012  . Obesity, morbid 05/01/2012  . Hypersomnia, persistent 05/01/2012  . OSA on CPAP 04/19/2012  . Essential hypertension, benign 12/16/2009  . CORONARY ATHEROSCLEROSIS NATIVE CORONARY ARTERY 12/16/2009  . Type 2 diabetes mellitus, uncontrolled 11/30/2009  . HYPERLIPIDEMIA 11/30/2009    Deniece Ree PT, DPT [phone]  [facility] [address] [place], [place], [address] Phone: [phone]   Fax:  [phone]

## 2014-06-05 NOTE — Telephone Encounter (Signed)
Needs office visit.

## 2014-06-06 ENCOUNTER — Other Ambulatory Visit: Payer: Self-pay | Admitting: Family Medicine

## 2014-06-08 ENCOUNTER — Encounter (HOSPITAL_COMMUNITY): Payer: Medicare Other | Admitting: Physical Therapy

## 2014-06-08 NOTE — Telephone Encounter (Signed)
Needs office visit.

## 2014-06-10 ENCOUNTER — Ambulatory Visit (HOSPITAL_COMMUNITY): Payer: Medicare Other | Admitting: Physical Therapy

## 2014-06-12 ENCOUNTER — Telehealth (HOSPITAL_COMMUNITY): Payer: Self-pay

## 2014-06-12 ENCOUNTER — Ambulatory Visit (HOSPITAL_COMMUNITY): Payer: Medicare Other

## 2014-06-12 DIAGNOSIS — E291 Testicular hypofunction: Secondary | ICD-10-CM | POA: Diagnosis not present

## 2014-06-12 NOTE — Telephone Encounter (Signed)
No show, called and left message informing pt. of 2 no show policy.  Contact information given.  Further apt.s cancelled.  Aldona Lento, PTA

## 2014-06-15 ENCOUNTER — Ambulatory Visit (HOSPITAL_COMMUNITY): Payer: Medicare Other | Admitting: Physical Therapy

## 2014-06-17 ENCOUNTER — Other Ambulatory Visit: Payer: Self-pay | Admitting: Family Medicine

## 2014-06-17 ENCOUNTER — Encounter (HOSPITAL_COMMUNITY): Payer: Medicare Other

## 2014-06-17 NOTE — Telephone Encounter (Signed)
Needs office visit.

## 2014-06-19 ENCOUNTER — Encounter (HOSPITAL_COMMUNITY): Payer: Medicare Other

## 2014-06-22 ENCOUNTER — Encounter (HOSPITAL_COMMUNITY): Payer: Medicare Other | Admitting: Physical Therapy

## 2014-06-24 ENCOUNTER — Encounter (HOSPITAL_COMMUNITY): Payer: Medicare Other

## 2014-06-26 ENCOUNTER — Encounter (HOSPITAL_COMMUNITY): Payer: Medicare Other | Admitting: Physical Therapy

## 2014-06-26 DIAGNOSIS — E291 Testicular hypofunction: Secondary | ICD-10-CM | POA: Diagnosis not present

## 2014-06-29 ENCOUNTER — Encounter (HOSPITAL_COMMUNITY): Payer: Medicare Other | Admitting: Physical Therapy

## 2014-06-29 ENCOUNTER — Other Ambulatory Visit: Payer: Self-pay

## 2014-06-30 ENCOUNTER — Other Ambulatory Visit: Payer: Self-pay | Admitting: Family Medicine

## 2014-07-01 ENCOUNTER — Encounter (HOSPITAL_COMMUNITY): Payer: Medicare Other

## 2014-07-02 ENCOUNTER — Telehealth: Payer: Self-pay | Admitting: Cardiology

## 2014-07-02 ENCOUNTER — Telehealth: Payer: Self-pay | Admitting: Neurology

## 2014-07-02 NOTE — Telephone Encounter (Signed)
Pt is having trouble sleeping and was wondering if there is anything he can take to help him

## 2014-07-02 NOTE — Telephone Encounter (Signed)
Patient called requesting refill for rOPINIRole (REQUIP) 4 MG tablet . He states pharmacy said it was faxed the beginning of this week.

## 2014-07-02 NOTE — Telephone Encounter (Signed)
It appears this Rx was sent last month to last until appt.  I called the pharmacy to clarify.  Spoke with Casimer Bilis, who verified they do have the Rx on file from last month nd will fill it today.  I called the patient back to advise.  Got no answer.  Left message.

## 2014-07-02 NOTE — Telephone Encounter (Signed)
There are several sleep medications available, both over-the-counter and prescription. He should address this with Dr. Wolfgang Phoenix, as he may have a general preference. Most preparations should be fine from a cardiac perspective.

## 2014-07-02 NOTE — Telephone Encounter (Signed)
LM for pt with Dr.McDowell's suggestion

## 2014-07-02 NOTE — Telephone Encounter (Signed)
Will forward to Dr. McDowell 

## 2014-07-03 ENCOUNTER — Encounter (HOSPITAL_COMMUNITY): Payer: Medicare Other | Admitting: Physical Therapy

## 2014-07-09 ENCOUNTER — Ambulatory Visit (INDEPENDENT_AMBULATORY_CARE_PROVIDER_SITE_OTHER): Payer: Medicare Other | Admitting: Adult Health

## 2014-07-09 ENCOUNTER — Encounter: Payer: Self-pay | Admitting: Adult Health

## 2014-07-09 ENCOUNTER — Other Ambulatory Visit: Payer: Self-pay | Admitting: Family Medicine

## 2014-07-09 VITALS — BP 121/68 | HR 80 | Ht 65.0 in | Wt 221.0 lb

## 2014-07-09 DIAGNOSIS — Z9989 Dependence on other enabling machines and devices: Principal | ICD-10-CM

## 2014-07-09 DIAGNOSIS — G2581 Restless legs syndrome: Secondary | ICD-10-CM

## 2014-07-09 DIAGNOSIS — G4733 Obstructive sleep apnea (adult) (pediatric): Secondary | ICD-10-CM

## 2014-07-09 DIAGNOSIS — I25119 Atherosclerotic heart disease of native coronary artery with unspecified angina pectoris: Secondary | ICD-10-CM | POA: Diagnosis not present

## 2014-07-09 MED ORDER — ARMODAFINIL 50 MG PO TABS: ORAL_TABLET | ORAL | Status: DC

## 2014-07-09 MED ORDER — TRAMADOL HCL 50 MG PO TABS: 50.0000 mg | ORAL_TABLET | Freq: Every evening | ORAL | Status: DC | PRN

## 2014-07-09 NOTE — Patient Instructions (Signed)
Use CPAP nightly Will fax order to [facility]  Try tramadol for severe pain related to RLS Try nuvigil 150 mg in the AM and 50 mg after lunch if needed.

## 2014-07-09 NOTE — Telephone Encounter (Signed)
Needs office visit.

## 2014-07-09 NOTE — Progress Notes (Signed)
PATIENT: Paul Baker DOB: 1950/07/20  REASON FOR VISIT: follow up- OSA on CPAP, RLS HISTORY FROM: patient PCP: Dr. Wolfgang Phoenix  HISTORY OF PRESENT ILLNESS: Paul Baker is a 64 year old male with a history of obstructive sleep apnea and restless leg syndrome. He returns today for a compliance download however his machine and the card has not been working properly. The patient has visited his DME company several times. We will work on getting the patient set up with a new DME company and possibly a new machine. The patient's compliance has been excellent. Patient continues to have trouble with restless leg symptoms. He states that some nights are good and others are worse. He continues to use Requip 6 mg as well as Klonopin 2 mg. In the past he has tried hydrocodone for severe pain but has not had these refilled recently. He states that he would rather not be on narcotic medication because he is worried about possible addiction. He states that some nights he has to take a warm bath in order to get relief. He continues to use Nuvigil during the day. He states occasionally he has to take a 50 mg tablet in the afternoon to prevent daytime sleepiness. He denies any new medical issues. He returns today for follow-up.  HISTORY 01/08/14: Paul Baker is a 64 year old male with a history of obstructive sleep Apnea. He returns today for a 90 day compliance download. He brought his machine with him today and his reports shows an AHI of 0.6 with pressure set between 6-16 cm of water, uses his machine for 5 hours and 46 minutes a night, with 90 %compliance. His Epworth score is 20 points was previously 22 points. His fatigue severity score is 36 was previously 43. Patient reports that he gets about 5-8 hours of sleep a night. He goes to bed around 9:00PM and arises at 4:45AM. He denies having trouble falling asleep or staying a sleep. States that the gets up about 2 times a night to urinate. Overall patient  feels that CPAP has improved his sleepiness and fatigue. Patient states that he has a daily headache that has been going on for years. He states that they are not severe. He states that he will take tylenol and sometimes it helps and other times it does not. The patient uses Requip 6 mg and Klonopin 2 mg for RLS. He states that definitely helps. He does have to use hydrocodone when it is severe. He also takes [place] and reports that works well but in the afternoon he gets sleepy. He states that he takes 150 mg in the AM and will take a 50 mg tablet in the afternoon only if he has to go somewhere.  REVIEW OF SYSTEMS: Out of a complete 14 system review of symptoms, the patient complains only of the following symptoms, and all other reviewed systems are negative.  Hearing loss, ringing in ears, cough, eye discharge, leg swelling, restless leg, insomnia, apnea, daytime sleepiness, environmental allergies, joint pain, joint swelling, back pain, neck pain, dizziness, headache, numbness, moles, itching, wounds  ALLERGIES: Allergies  Allergen Reactions  . Cardizem [Diltiazem Hcl]     Edema   . Cardura [Doxazosin Mesylate]     Side effects  . Lipitor [Atorvastatin] Other (See Comments)    Leg cramps  . Paxil [Paroxetine Hcl]     Side effects   . Adhesive [Tape] Rash  . Codeine Rash and Other (See Comments)    Headache  HOME MEDICATIONS: Outpatient Prescriptions Prior to Visit  Medication Sig Dispense Refill  . amLODipine (NORVASC) 5 MG tablet TAKE ONE TABLET BY MOUTH ONCE DAILY. 30 tablet 6  . Armodafinil (NUVIGIL) 50 MG tablet TAKE 2 TABLETS BY MOUTH IN THE MORNING AND 1 TABLET IN THE MID-AFTERNOON. (Patient taking differently: Take 150 mg by mouth as directed. ) 90 tablet 5  . clobetasol (TEMOVATE) 0.05 % external solution Apply 1 application topically daily as needed (irritation.).     [name] clonazePAM (KLONOPIN) 1 MG tablet TAKE (1) TABLET BY MOUTH AT BEDTIME FOR RESTLESS LEGS. 30 tablet 3    . docusate sodium (COLACE) 100 MG capsule Take 1 capsule (100 mg total) by mouth 2 (two) times daily. 10 capsule 0  . doxycycline (VIBRAMYCIN) 100 MG capsule Take 1 capsule (100 mg total) by mouth 2 (two) times daily. 28 capsule 0  . fenofibrate 160 MG tablet TAKE (1) TABLET BY MOUTH AT BEDTIME. 30 tablet 0  . ferrous sulfate 325 (65 FE) MG tablet Take 1 tablet (325 mg total) by mouth 3 (three) times daily after meals.  3  . HUMALOG 100 UNIT/ML injection Inject 15 Units into the skin 3 (three) times daily with meals. Patient is on a sliding scale    . HYDROcodone-acetaminophen (NORCO) 10-325 MG per tablet Take 1-2 tablets by mouth every 4 (four) hours as needed. 100 tablet 0  . isosorbide mononitrate (IMDUR) 60 MG 24 hr tablet TAKE (1/2) TABLET BY MOUTH TWICE DAILY. 90 tablet 3  . LEVEMIR 100 UNIT/ML injection Inject 50 Units into the skin 2 (two) times daily.     [name] levothyroxine (SYNTHROID, LEVOTHROID) 88 MCG tablet Take 88 mcg by mouth daily before breakfast.    . loratadine (CLARITIN) 10 MG tablet Take 10 mg by mouth at bedtime as needed for allergies.     [name] losartan (COZAAR) 100 MG tablet TAKE ONE TABLET BY MOUTH ONCE DAILY. 30 tablet 0  . metFORMIN (GLUCOPHAGE) 500 MG tablet TAKE 2 TABLETS EACH MORNING, 1 TABLET AT NOON, AND 2 TABLETS EVERY EVENING. 150 tablet 0  . methocarbamol (ROBAXIN) 500 MG tablet Take 1 tablet (500 mg total) by mouth every 6 (six) hours as needed for muscle spasms. 50 tablet 0  . metoprolol succinate (TOPROL-XL) 50 MG 24 hr tablet TAKE (1) TABLET BY MOUTH TWICE DAILY. 60 tablet 6  . NITROSTAT 0.4 MG SL tablet DISSOLVE 1 TABLET UNDER TONGUE EVERY 5 MINUTES UP TO 15 MIN FOR CHESTPAIN. IF NO RELIEF CALL 911. 25 tablet 3  . omeprazole (PRILOSEC) 20 MG capsule TAKE (1) CAPSULE BY MOUTH TWICE DAILY. 60 capsule 5  . ONE TOUCH ULTRA TEST test strip 1 each by Other route. Four to five times daily.    . polyethylene glycol (MIRALAX / GLYCOLAX) packet Take 17 g by mouth 2 (two)  times daily. 14 each 0  . polyvinyl alcohol (LIQUIFILM TEARS) 1.4 % ophthalmic solution Place 1 drop into both eyes daily as needed for dry eyes.    . pravastatin (PRAVACHOL) 20 MG tablet TAKE ONE TABLET BY MOUTH ONCE DAILY. 30 tablet 0  . rivaroxaban (XARELTO) 10 MG TABS tablet Take 1 tablet (10 mg total) by mouth daily. 14 tablet 0  . rOPINIRole (REQUIP) 4 MG tablet TAKE 1 TO 1 1/2 TABLET BY MOUTH AT BEDTIME. 45 tablet 1  . sodium chloride (OCEAN) 0.65 % SOLN nasal spray Place 1-2 sprays into both nostrils daily as needed for congestion.    . Testosterone Cypionate &  Prop 200-20 MG/ML SOLN Inject 1 mL into the muscle every 14 (fourteen) days.    [name] triamterene-hydrochlorothiazide (MAXZIDE-25) 37.5-25 MG per tablet TAKE ONE TABLET BY MOUTH ONCE DAILY. 30 tablet 0  . VENTOLIN HFA 108 (90 BASE) MCG/ACT inhaler Inhale 1 puff into the lungs every 4 (four) hours as needed for wheezing or shortness of breath.     . Vitamin D, Ergocalciferol, (DRISDOL) 50000 UNITS CAPS capsule Take 50,000 Units by mouth every 7 (seven) days. Wednesday morning.     No facility-administered medications prior to visit.    PAST MEDICAL HISTORY: Past Medical History  Diagnosis Date  . Arthritis   . Type 2 diabetes mellitus   . Mixed hyperlipidemia   . Essential hypertension, benign   . Septic arthritis of knee, left   . OSA (obstructive sleep apnea)   . Coronary atherosclerosis of native coronary artery     Diseased nondominant RCA  . Pulmonary embolism 2004  . DVT (deep venous thrombosis) 2005    RT ARM / SHOULDER   . Rotator cuff disorder     LEFT  . Colon polyp   . Morbid obesity   . RLS (restless legs syndrome)   . Hypersomnia     CPAP of 16 cm, diagnosed with AHI of 60 in 2012,epworth 21- narcolepsy?  [name] MRSA (methicillin resistant staph aureus) culture positive     08/2012  . Narcolepsy   . Complication of anesthesia 1991    DIFFICULTY WAKING UP "TEMP DROPPED" - NO PROBLEM SINCE WITH ANESTHESIA  .  Asthma     NO RECENT PROBLEMS  . Headache   . Abnormal movement of lower extremity     ALSO UPPER EXTREMITIES - FOLLOWED BY DR. DOLMEYER  . History of nonmelanoma skin cancer   . Psoriasis   . History of transfusion   . PE (pulmonary embolism) 1982    AFTER KNEE ARTHROTOMY  . Hypothyroidism   . Cancer     HX SKION CANCER    PAST SURGICAL HISTORY: Past Surgical History  Procedure Laterality Date  . Total knee arthroplasty  2003    LEFT  . Lumbar disc surgery      Left L3, L4, L5 discecotomy with decompression of L4 root  . Wrist surgery    . Total knee revision  2005    LEFT  . Colonoscopy    . Back surgery    . Knee surgery      X 25 TIMES  . Total knee arthroplasty Right 03/23/2014    Procedure: RIGHT TOTAL KNEE ARTHROPLASTY AND REMOVAL RIGHT TIBIAL  DEEP IMPLANT STAPLE;  Surgeon: Paralee Cancel, MD;  Location: [facility];  Service: Orthopedics;  Laterality: Right;    FAMILY HISTORY: Family History  Problem Relation Age of Onset  . Hypertension Father   . Heart attack Father   . Kidney Stones Father   . Diabetes Sister   . Seizures Grandchild   . Narcolepsy Grandchild     SOCIAL HISTORY: History   Social History  . Marital Status: Married    Spouse Name: Toney Reil  . Number of Children: 2  . Years of Education: college   Occupational History  . Disabled   .     Social History Main Topics  . Smoking status: Former Smoker    Types: Cigarettes    Start date: 06/19/1967    Quit date: 01/03/1995  . Smokeless tobacco: Never Used  . Alcohol Use: No     Comment: quit  drinking in 07/86  . Drug Use: No  . Sexual Activity:    Partners: Female   Other Topics Concern  . Not on file   Social History Narrative    64 year old, right-handed, caucasian male with a past medical history of obesity, hypertension, hyperlipidemia, diabetes, obstructive sleep apnea, presenting with frequent nighttime awakenings, excessive daytime sleepiness, also transient confusional  episodes.RLS and one beosity, OSA on CPAP with AHI of 3.2 and  setting of 16 cm water , [facility] .      PHYSICAL EXAM  Filed Vitals:   07/09/14 1102  BP: 121/68  Pulse: 80  Height: 5\' 5"  (1.651 m)  Weight: 221 lb (100.245 kg)   Body mass index is 36.78 kg/(m^2).  Generalized: Well developed, in no acute distress  Neck: Circumference 19 inches, Mallampati 4+  Neurological examination  Mentation: Alert oriented to time, place, history taking. Follows all commands speech and language fluent Cranial nerve II-XII: Pupils were equal round reactive to light. Extraocular movements were full, visual field were full on confrontational test. Facial sensation and strength were normal. Uvula tongue midline. Head turning and shoulder shrug  were normal and symmetric. Motor: The motor testing reveals 5 over 5 strength of all 4 extremities. Good symmetric motor tone is noted throughout.   Coordination: Cerebellar testing reveals good finger-nose-finger and heel-to-shin bilaterally.  Gait and station: Gait is normal Reflexes: Deep tendon reflexes are symmetric and normal bilaterally.   DIAGNOSTIC DATA (LABS, IMAGING, TESTING) - I reviewed patient records, labs, notes, testing and imaging myself where available.  Lab Results  Component Value Date   WBC 7.6 03/24/2014   HGB 10.2* 03/24/2014   HCT 32.7* 03/24/2014   MCV 95.1 03/24/2014   PLT 206 03/24/2014      Component Value Date/Time   NA 133* 03/24/2014 0521   NA 141 10/08/2013 1032   K 4.9 03/24/2014 0521   CL 99 03/24/2014 0521   CO2 28 03/24/2014 0521   GLUCOSE 248* 03/24/2014 0521   GLUCOSE 204* 10/08/2013 1032   BUN 28* 03/24/2014 0521   BUN 28* 10/08/2013 1032   CREATININE 1.43* 03/24/2014 0521   CREATININE 1.20 05/13/2013 1121   CALCIUM 8.0* 03/24/2014 0521   PROT 6.7 10/08/2013 1032   PROT 7.6 05/13/2013 1121   ALBUMIN 3.9 05/13/2013 1121   AST 17 10/08/2013 1032   ALT 22 10/08/2013 1032   ALKPHOS 49 10/08/2013  1032   BILITOT <0.2 10/08/2013 1032   GFRNONAA 51* 03/24/2014 0521   GFRAA 59* 03/24/2014 0521   Lab Results  Component Value Date   CHOL 211* 11/13/2013   HDL 49 11/13/2013   LDLCALC 131* 11/13/2013   TRIG 155* 11/13/2013   CHOLHDL 4.3 11/13/2013       ASSESSMENT AND PLAN 64 y.o. year old male  has a past medical history of Arthritis; Type 2 diabetes mellitus; Mixed hyperlipidemia; Essential hypertension, benign; Septic arthritis of knee, left; OSA (obstructive sleep apnea); Coronary atherosclerosis of native coronary artery; Pulmonary embolism (2004); DVT (deep venous thrombosis) (2005); Rotator cuff disorder; Colon polyp; Morbid obesity; RLS (restless legs syndrome); Hypersomnia; MRSA (methicillin resistant staph aureus) culture positive; Narcolepsy; Complication of anesthesia (1991); Asthma; Headache; Abnormal movement of lower extremity; History of nonmelanoma skin cancer; Psoriasis; History of transfusion; PE (pulmonary embolism) (1982); Hypothyroidism; and Cancer. here with:  1. Obstructive sleep apnea on CPAP 2. Restless leg syndrome 3. Excessive daytime sleepiness  [facility] the [facility] get  a new CPAP machine. I have also refer the patient for a restless leg research trial at our office. In the meantime I will give the patient a low dose of tramadol to help with severe pain. I have reviewed the side effects of this medication with the patient. The patient will continue taking Nuvigil 150 mg in the morning and 50 mg in the evening if needed. Patient verbalized understanding. If his symptoms worsen or he develops new symptoms he should let us know. F/U in 6 months or sooner if needed.     Ward Givens, MSN, NP-C 07/09/2014, 11:12 AM [facility] [address] [place], [place] [address] [phone]  Note: This document was prepared with digital dictation and possible smart phrase technology. Any  transcriptional errors that result from this process are unintentional.

## 2014-07-10 DIAGNOSIS — E291 Testicular hypofunction: Secondary | ICD-10-CM | POA: Diagnosis not present

## 2014-07-10 NOTE — Progress Notes (Signed)
I agree with the above plan 

## 2014-07-21 ENCOUNTER — Other Ambulatory Visit: Payer: Self-pay | Admitting: Family Medicine

## 2014-07-21 DIAGNOSIS — N189 Chronic kidney disease, unspecified: Secondary | ICD-10-CM | POA: Diagnosis not present

## 2014-07-21 DIAGNOSIS — I10 Essential (primary) hypertension: Secondary | ICD-10-CM | POA: Diagnosis not present

## 2014-07-21 DIAGNOSIS — R42 Dizziness and giddiness: Secondary | ICD-10-CM | POA: Diagnosis not present

## 2014-07-21 DIAGNOSIS — E119 Type 2 diabetes mellitus without complications: Secondary | ICD-10-CM | POA: Diagnosis not present

## 2014-07-21 NOTE — Telephone Encounter (Signed)
Needs office visit.

## 2014-07-24 ENCOUNTER — Telehealth: Payer: Self-pay | Admitting: Neurology

## 2014-07-24 DIAGNOSIS — E291 Testicular hypofunction: Secondary | ICD-10-CM | POA: Diagnosis not present

## 2014-07-24 NOTE — Telephone Encounter (Signed)
Patient is requesting Rx for Tramadol 50mg .  Last written on 07/07 by MM for #15.  Patient was to take at bedtime if needed for severe pain.  MM is out of the office today.  Would you like to refill at this time?  Please advise.  Thank you.

## 2014-07-24 NOTE — Telephone Encounter (Signed)
Patient requesting refill of tramadol 50 mg 1 at bedtime as needed. Best call back number is [phone]

## 2014-07-27 MED ORDER — TRAMADOL HCL 50 MG PO TABS: 50.0000 mg | ORAL_TABLET | Freq: Every evening | ORAL | Status: DC | PRN

## 2014-07-27 NOTE — Telephone Encounter (Signed)
Dr. Richardson Landry out of office last week. Just received message today. Tramadol already filled today by another provider.

## 2014-07-27 NOTE — Telephone Encounter (Signed)
Paul Baker is this for [place]???

## 2014-07-29 NOTE — Telephone Encounter (Signed)
Patient called inquiring if RX was ready for pick up. Please call and advise.

## 2014-07-29 NOTE — Telephone Encounter (Signed)
I called [facility].  Spoke with Joey.  She verified this Rx is ready for pick up.  I called the patient back to advise.  He is aware.

## 2014-07-30 ENCOUNTER — Other Ambulatory Visit: Payer: Self-pay | Admitting: Family Medicine

## 2014-07-31 ENCOUNTER — Encounter: Payer: Self-pay | Admitting: Family Medicine

## 2014-07-31 ENCOUNTER — Ambulatory Visit (INDEPENDENT_AMBULATORY_CARE_PROVIDER_SITE_OTHER): Payer: Medicare Other | Admitting: Family Medicine

## 2014-07-31 ENCOUNTER — Other Ambulatory Visit: Payer: Self-pay | Admitting: *Deleted

## 2014-07-31 VITALS — BP 118/72 | Ht 65.0 in | Wt 210.8 lb

## 2014-07-31 DIAGNOSIS — I10 Essential (primary) hypertension: Secondary | ICD-10-CM | POA: Diagnosis not present

## 2014-07-31 DIAGNOSIS — I2510 Atherosclerotic heart disease of native coronary artery without angina pectoris: Secondary | ICD-10-CM

## 2014-07-31 DIAGNOSIS — E785 Hyperlipidemia, unspecified: Secondary | ICD-10-CM | POA: Diagnosis not present

## 2014-07-31 DIAGNOSIS — Z79899 Other long term (current) drug therapy: Secondary | ICD-10-CM | POA: Diagnosis not present

## 2014-07-31 DIAGNOSIS — R7309 Other abnormal glucose: Secondary | ICD-10-CM | POA: Diagnosis not present

## 2014-07-31 NOTE — Progress Notes (Signed)
   Subjective:    Patient ID: Paul Baker, male    DOB: [birth year], 64 y.o.   MRN: [id number] Patient arrives office with multiple concerns. Hypertension This is a chronic problem. The current episode started more than 1 year ago. Risk factors for coronary artery disease include dyslipidemia, diabetes mellitus, obesity and male gender. Treatments tried: norvasc, cozaar, lopressor.   Now seen a kidney specialist and encouragement from Dr. need down. They for now are happy with his creatinine remaining stable and somewhat improved.    Breathing good, no use of inhaler. Avoids smoking exposure to dust started.  Comp with chol med . Watching diet. Compliant with meds. No obvious side effect. Next   cpap helping the sleep apnea   Exercising nearly every day, has lost forty pounds     Review of Systems No headache no chest pain no back pain chronic joint pain no shortness breath    Objective:   Physical Exam  Alert vitals stable blood pressure good on repeat H&T normal. Lungs clear. Heart regular in rhythm ankles trace edema      Assessment & Plan:  Impression hyperlipidemia discuss organ issue for LDL of 70 with risk factors and known atherosclerotic disease. Discussed length #2 hypertension good control #3 restless leg syndrome stable #4 reflux discussed #5 sleep apnea. #6 asthma stable plan medications refilled. Diet exercise discussed. Appropriate blood work. Recheck in 6 months. WSL

## 2014-08-04 NOTE — Progress Notes (Signed)
Order(s) created erroneously. Erroneous order ID: [id number]  Order moved by: Alfonso Patten  Order move date/time: 08/04/2014 2:50 PM  Source Patient: [id number]  Source Contact: 07/31/2014  Destination Patient: [id number]  Destination Contact: 03/19/2012

## 2014-08-05 ENCOUNTER — Other Ambulatory Visit: Payer: Self-pay | Admitting: Family Medicine

## 2014-08-07 DIAGNOSIS — E291 Testicular hypofunction: Secondary | ICD-10-CM | POA: Diagnosis not present

## 2014-08-18 ENCOUNTER — Other Ambulatory Visit: Payer: Self-pay | Admitting: Family Medicine

## 2014-08-18 ENCOUNTER — Other Ambulatory Visit: Payer: Self-pay | Admitting: Neurology

## 2014-08-19 ENCOUNTER — Ambulatory Visit (INDEPENDENT_AMBULATORY_CARE_PROVIDER_SITE_OTHER): Payer: Medicare Other | Admitting: Cardiology

## 2014-08-19 ENCOUNTER — Encounter: Payer: Self-pay | Admitting: Cardiology

## 2014-08-19 ENCOUNTER — Other Ambulatory Visit: Payer: Self-pay | Admitting: Adult Health

## 2014-08-19 VITALS — BP 140/86 | HR 75 | Ht 65.0 in | Wt 207.0 lb

## 2014-08-19 DIAGNOSIS — I25119 Atherosclerotic heart disease of native coronary artery with unspecified angina pectoris: Secondary | ICD-10-CM | POA: Diagnosis not present

## 2014-08-19 DIAGNOSIS — E782 Mixed hyperlipidemia: Secondary | ICD-10-CM | POA: Diagnosis not present

## 2014-08-19 DIAGNOSIS — I2510 Atherosclerotic heart disease of native coronary artery without angina pectoris: Secondary | ICD-10-CM

## 2014-08-19 DIAGNOSIS — I10 Essential (primary) hypertension: Secondary | ICD-10-CM | POA: Diagnosis not present

## 2014-08-19 NOTE — Progress Notes (Signed)
Cardiology Office Note  Date: 08/19/2014   ID: Paul Baker, DOB Oct 18, 1950, MRN [id number]  PCP: Paul Hillier, MD  Primary Cardiologist: Paul Lesches, MD   Chief Complaint  Patient presents with  . Coronary Artery Disease    History of Present Illness: Paul Baker is a 64 y.o. male last seen in February. He presents for a routine follow-up visit. From a cardiac perspective he has been stable without increasing angina or shortness of breath. He is still exercising most days of the week at the gym. We reviewed his medications which are outlined below, no significant changes in his cardiac regimen.  He underwent right total knee arthroplasty back in March and has undergone outpatient rehabilitation.  Recent visit with Dr. Wolfgang Baker noted. Follow-up lab work is pending, he is to have a lipid panel.   Past Medical History  Diagnosis Date  . Arthritis   . Type 2 diabetes mellitus   . Mixed hyperlipidemia   . Essential hypertension, benign   . Septic arthritis of knee, left   . OSA (obstructive sleep apnea)   . Coronary atherosclerosis of native coronary artery     Diseased nondominant RCA  . Pulmonary embolism 2004  . DVT (deep venous thrombosis) 2005    Right arm  . Rotator cuff disorder     Left  . Colon polyp   . Morbid obesity   . RLS (restless legs syndrome)   . Hypersomnia     CPAP of 16 cm, diagnosed with AHI of 60 in 2012,epworth 21- narcolepsy?  Paul Baker MRSA (methicillin resistant staph aureus) culture positive     08/2012  . Narcolepsy   . Asthma   . Headache   . History of nonmelanoma skin cancer   . Psoriasis   . History of transfusion   . PE (pulmonary embolism) 1982  . Hypothyroidism      Current Outpatient Prescriptions  Medication Sig Dispense Refill  . amLODipine (NORVASC) 5 MG tablet TAKE ONE TABLET BY MOUTH ONCE DAILY. 30 tablet 6  . Armodafinil (NUVIGIL) 50 MG tablet TAKE 3 TABLETS BY MOUTH IN THE MORNING AND 1 TABLET IN THE  MID-AFTERNOON. 120 tablet 5  . clobetasol (TEMOVATE) 0.05 % external solution Apply 1 application topically daily as needed (irritation.).     Paul Baker clonazePAM (KLONOPIN) 1 MG tablet TAKE (1) TABLET BY MOUTH AT BEDTIME FOR RESTLESS LEGS. 30 tablet 3  . docusate sodium (COLACE) 100 MG capsule Take 1 capsule (100 mg total) by mouth 2 (two) times daily. 10 capsule 0  . fenofibrate 160 MG tablet TAKE (1) TABLET BY MOUTH AT BEDTIME. 30 tablet 5  . ferrous sulfate 325 (65 FE) MG tablet Take 1 tablet (325 mg total) by mouth 3 (three) times daily after meals.  3  . HUMALOG 100 UNIT/ML injection Inject 15 Units into the skin 3 (three) times daily with meals. Patient is on a sliding scale    . isosorbide mononitrate (IMDUR) 60 MG 24 hr tablet TAKE (1/2) TABLET BY MOUTH TWICE DAILY. 90 tablet 3  . LEVEMIR 100 UNIT/ML injection Inject 50 Units into the skin 2 (two) times daily.     Paul Baker levothyroxine (SYNTHROID, LEVOTHROID) 88 MCG tablet Take 88 mcg by mouth daily before breakfast. Patient on 100 mcg    . loratadine (CLARITIN) 10 MG tablet Take 10 mg by mouth at bedtime as needed for allergies.     Paul Baker losartan (COZAAR) 100 MG tablet TAKE ONE TABLET BY MOUTH ONCE  DAILY. 30 tablet 0  . metFORMIN (GLUCOPHAGE) 500 MG tablet TAKE 2 TABLETS EACH MORNING, 1 TABLET AT NOON, AND 2 TABLETS EVERY EVENING. 150 tablet 0  . methocarbamol (ROBAXIN) 500 MG tablet Take 1 tablet (500 mg total) by mouth every 6 (six) hours as needed for muscle spasms. 50 tablet 0  . metoprolol succinate (TOPROL-XL) 50 MG 24 hr tablet TAKE (1) TABLET BY MOUTH TWICE DAILY. 60 tablet 6  . NITROSTAT 0.4 MG SL tablet DISSOLVE 1 TABLET UNDER TONGUE EVERY 5 MINUTES UP TO 15 MIN FOR CHESTPAIN. IF NO RELIEF CALL 911. 25 tablet 3  . omeprazole (PRILOSEC) 20 MG capsule TAKE (1) CAPSULE BY MOUTH TWICE DAILY. 60 capsule 5  . ONE TOUCH ULTRA TEST test strip 1 each by Other route. Four to five times daily.    . polyethylene glycol (MIRALAX / GLYCOLAX) packet Take 17 g  by mouth 2 (two) times daily. 14 each 0  . polyvinyl alcohol (LIQUIFILM TEARS) 1.4 % ophthalmic solution Place 1 drop into both eyes daily as needed for dry eyes.    . pravastatin (PRAVACHOL) 20 MG tablet TAKE ONE TABLET BY MOUTH ONCE DAILY. 30 tablet 0  . rOPINIRole (REQUIP) 4 MG tablet TAKE 1 TO 1 1/2 TABLET BY MOUTH AT BEDTIME. 45 tablet 1  . sodium chloride (OCEAN) 0.65 % SOLN nasal spray Place 1-2 sprays into both nostrils daily as needed for congestion.    . Testosterone Cypionate & Prop 200-20 MG/ML SOLN Inject 1 mL into the muscle every 14 (fourteen) days.    . traMADol (ULTRAM) 50 MG tablet Take 1 tablet (50 mg total) by mouth at bedtime as needed for severe pain (RLS). 15 tablet 0  . triamterene-hydrochlorothiazide (MAXZIDE-25) 37.5-25 MG per tablet TAKE ONE TABLET BY MOUTH ONCE DAILY. 30 tablet 5  . VENTOLIN HFA 108 (90 BASE) MCG/ACT inhaler Inhale 1 puff into the lungs every 4 (four) hours as needed for wheezing or shortness of breath.     . Vitamin D, Ergocalciferol, (DRISDOL) 50000 UNITS CAPS capsule Take 50,000 Units by mouth every 7 (seven) days. Wednesday morning.     No current facility-administered medications for this visit.    Allergies:  Cardizem; Cardura; Lipitor; Paxil; Adhesive; and Codeine   Social History: The patient  reports that he quit smoking about 19 years ago. His smoking use included Cigarettes. He started smoking about 47 years ago. He has never used smokeless tobacco. He reports that he does not drink alcohol or use illicit drugs.   ROS:  Please see the history of present illness. Otherwise, complete review of systems is positive for knee soreness.  All other systems are reviewed and negative.   Physical Exam: VS:  BP 140/86 mmHg  Pulse 75  Ht 5\' 5"  (1.651 m)  Wt 207 lb (93.895 kg)  BMI 34.45 kg/m2  SpO2 95%, BMI Body mass index is 34.45 kg/(m^2).  Wt Readings from Last 3 Encounters:  08/19/14 207 lb (93.895 kg)  07/31/14 210 lb 12.8 oz (95.618 kg)    07/09/14 221 lb (100.245 kg)     Overweight male, appears comfortable at rest. HEENT: Conjunctiva and lids normal, oropharynx clear. Neck: Supple, no elevated JVP or carotid bruits, no thyromegaly. Lungs: Clear to auscultation, nonlabored breathing at rest. Cardiac: Regular rate and rhythm, no S3 or significant systolic murmur, no pericardial rub. Abdomen: Soft, nontender, bowel sounds present, no guarding or rebound. Extremities: No pitting edema, right knee in brace, distal pulses 2+. Skin: Warm and  dry. Musculoskeletal: No kyphosis. Neuropsychiatric: Alert and oriented x3, affect grossly appropriate.   ECG: ECG is not ordered today.  Recent Labwork: 10/08/2013: ALT 22; AST 17 03/24/2014: BUN 28*; Creatinine, Ser 1.43*; Hemoglobin 10.2*; Platelets 206; Potassium 4.9; Sodium 133*     Component Value Date/Time   CHOL 211* 11/13/2013 0843   TRIG 155* 11/13/2013 0843   HDL 49 11/13/2013 0843   CHOLHDL 4.3 11/13/2013 0843   VLDL 31 11/13/2013 0843   LDLCALC 131* 11/13/2013 0843    Other Studies Reviewed Today:  Cardiac catheterization in December 2011 demonstrated a 90% proximal RCA stenosis in a small nondominant vessel, otherwise no significant flow limiting left-sided disease - only 20% proximal LAD noted.  ASSESSMENT AND PLAN:  1. Symptomatically stable CAD with known disease involving a small nondominant RCA that is being managed medically. Recommend continued diet and exercise, no change in current regimen.  2. Hyperlipidemia, he continues on Pravachol. Follow-up lab work pending per Dr. Wolfgang Baker.  3. Essential hypertension, blood pressure mildly elevated today. No change in current regimen.  Current medicines were reviewed at length with the patient today.  Disposition: FU with me in 6 months.   Signed, Satira Sark, MD, [facility] 08/19/2014 9:04 AM    [facility] at [facility] [address] [place], [place] [address] Phone: [phone]; Fax: [phone]

## 2014-08-19 NOTE — Patient Instructions (Signed)
Your physician wants you to follow-up in: 6 months with Dr.McDowell You will receive a reminder letter in the mail two months in advance. If you don't receive a letter, please call our office to schedule the follow-up appointment.     Your physician recommends that you continue on your current medications as directed. Please refer to the Current Medication list given to you today.     Thank you for choosing [facility] !        

## 2014-08-19 NOTE — Telephone Encounter (Signed)
Rx signed and faxed.

## 2014-08-20 DIAGNOSIS — E782 Mixed hyperlipidemia: Secondary | ICD-10-CM | POA: Diagnosis not present

## 2014-08-20 DIAGNOSIS — Z79899 Other long term (current) drug therapy: Secondary | ICD-10-CM | POA: Diagnosis not present

## 2014-08-20 DIAGNOSIS — E1165 Type 2 diabetes mellitus with hyperglycemia: Secondary | ICD-10-CM | POA: Diagnosis not present

## 2014-08-20 DIAGNOSIS — E785 Hyperlipidemia, unspecified: Secondary | ICD-10-CM | POA: Diagnosis not present

## 2014-08-20 DIAGNOSIS — E039 Hypothyroidism, unspecified: Secondary | ICD-10-CM | POA: Diagnosis not present

## 2014-08-20 LAB — HEMOGLOBIN A1C: HEMOGLOBIN A1C: 9.8 % — AB (ref 4.0–6.0)

## 2014-08-21 DIAGNOSIS — E291 Testicular hypofunction: Secondary | ICD-10-CM | POA: Diagnosis not present

## 2014-08-21 LAB — LIPID PANEL
CHOLESTEROL TOTAL: 154 mg/dL (ref 100–199)
Chol/HDL Ratio: 3.1 ratio units (ref 0.0–5.0)
HDL: 49 mg/dL (ref >39)
LDL Calculated: 67 mg/dL (ref 0–99)
Triglycerides: 191 mg/dL — ABNORMAL HIGH (ref 0–149)
VLDL Cholesterol Cal: 38 mg/dL (ref 5–40)

## 2014-08-21 LAB — HEPATIC FUNCTION PANEL
ALK PHOS: 47 IU/L (ref 39–117)
ALT: 11 IU/L (ref 0–44)
AST: 13 IU/L (ref 0–40)
Albumin: 4.4 g/dL (ref 3.6–4.8)
BILIRUBIN, DIRECT: 0.11 mg/dL (ref 0.00–0.40)
Total Protein: 6.6 g/dL (ref 6.0–8.5)

## 2014-08-24 ENCOUNTER — Encounter: Payer: Self-pay | Admitting: Family Medicine

## 2014-08-27 DIAGNOSIS — E039 Hypothyroidism, unspecified: Secondary | ICD-10-CM | POA: Diagnosis not present

## 2014-08-27 DIAGNOSIS — I10 Essential (primary) hypertension: Secondary | ICD-10-CM | POA: Diagnosis not present

## 2014-08-27 DIAGNOSIS — E1165 Type 2 diabetes mellitus with hyperglycemia: Secondary | ICD-10-CM | POA: Diagnosis not present

## 2014-08-27 DIAGNOSIS — E785 Hyperlipidemia, unspecified: Secondary | ICD-10-CM | POA: Diagnosis not present

## 2014-08-27 DIAGNOSIS — E6609 Other obesity due to excess calories: Secondary | ICD-10-CM | POA: Diagnosis not present

## 2014-08-28 ENCOUNTER — Other Ambulatory Visit: Payer: Self-pay | Admitting: Family Medicine

## 2014-08-28 ENCOUNTER — Other Ambulatory Visit: Payer: Self-pay | Admitting: Adult Health

## 2014-09-01 ENCOUNTER — Ambulatory Visit (INDEPENDENT_AMBULATORY_CARE_PROVIDER_SITE_OTHER): Payer: Medicare Other | Admitting: Family Medicine

## 2014-09-01 ENCOUNTER — Encounter: Payer: Self-pay | Admitting: Family Medicine

## 2014-09-01 VITALS — BP 128/74 | Temp 97.8°F | Ht 65.0 in | Wt 203.2 lb

## 2014-09-01 DIAGNOSIS — J019 Acute sinusitis, unspecified: Secondary | ICD-10-CM | POA: Diagnosis not present

## 2014-09-01 DIAGNOSIS — B9689 Other specified bacterial agents as the cause of diseases classified elsewhere: Secondary | ICD-10-CM

## 2014-09-01 DIAGNOSIS — I2510 Atherosclerotic heart disease of native coronary artery without angina pectoris: Secondary | ICD-10-CM

## 2014-09-01 MED ORDER — AMOXICILLIN-POT CLAVULANATE 875-125 MG PO TABS: 1.0000 | ORAL_TABLET | Freq: Two times a day (BID) | ORAL | Status: DC

## 2014-09-01 NOTE — Progress Notes (Signed)
   Subjective:    Patient ID: Paul Baker, male    DOB: 06/24/1950, 64 y.o.   MRN: [id number]  Cough This is a new problem. The current episode started in the past 7 days. The problem has been gradually worsening. The problem occurs every few minutes. The cough is non-productive. Associated symptoms include headaches and rhinorrhea. Pertinent negatives include no chest pain, ear pain, fever or wheezing. Associated symptoms comments: Abdominal pain,eye pain, eye drainage. Nothing aggravates the symptoms. Treatments tried: Liquid Tears. The treatment provided no relief.   Patient states blood sugars have been elevated since onset of symptoms.   Review of Systems  Constitutional: Negative for fever and activity change.  HENT: Positive for congestion and rhinorrhea. Negative for ear pain.   Eyes: Negative for discharge.  Respiratory: Positive for cough. Negative for wheezing.   Cardiovascular: Negative for chest pain.  Neurological: Positive for headaches.       Objective:   Physical Exam  Constitutional: He appears well-developed.  HENT:  Head: Normocephalic.  Mouth/Throat: Oropharynx is clear and moist. No oropharyngeal exudate.  Neck: Normal range of motion.  Cardiovascular: Normal rate, regular rhythm and normal heart sounds.   No murmur heard. Pulmonary/Chest: Effort normal and breath sounds normal. He has no wheezes.  Lymphadenopathy:    He has no cervical adenopathy.  Neurological: He exhibits normal muscle tone.  Skin: Skin is warm and dry.  Nursing note and vitals reviewed.         Assessment & Plan:  Viral syndrome with secondary sinus tightness antibiotics described warning signs were discussed follow-up if ongoing issues. Medications was sent in  Diabetes subpar control patient will be following up with diabetes doctor in the next couple days patient was told that probably could increase long-acting insulin but he can speak with his diabetic doctor regarding  the parameters

## 2014-09-02 DIAGNOSIS — H02831 Dermatochalasis of right upper eyelid: Secondary | ICD-10-CM | POA: Diagnosis not present

## 2014-09-02 DIAGNOSIS — H35372 Puckering of macula, left eye: Secondary | ICD-10-CM | POA: Diagnosis not present

## 2014-09-02 DIAGNOSIS — H26492 Other secondary cataract, left eye: Secondary | ICD-10-CM | POA: Diagnosis not present

## 2014-09-02 DIAGNOSIS — Z961 Presence of intraocular lens: Secondary | ICD-10-CM | POA: Diagnosis not present

## 2014-09-02 DIAGNOSIS — H02834 Dermatochalasis of left upper eyelid: Secondary | ICD-10-CM | POA: Diagnosis not present

## 2014-09-02 DIAGNOSIS — H4322 Crystalline deposits in vitreous body, left eye: Secondary | ICD-10-CM | POA: Diagnosis not present

## 2014-09-02 DIAGNOSIS — E119 Type 2 diabetes mellitus without complications: Secondary | ICD-10-CM | POA: Diagnosis not present

## 2014-09-02 LAB — HM DIABETES EYE EXAM

## 2014-09-03 DIAGNOSIS — E785 Hyperlipidemia, unspecified: Secondary | ICD-10-CM | POA: Diagnosis not present

## 2014-09-03 DIAGNOSIS — I10 Essential (primary) hypertension: Secondary | ICD-10-CM | POA: Diagnosis not present

## 2014-09-03 DIAGNOSIS — E6609 Other obesity due to excess calories: Secondary | ICD-10-CM | POA: Diagnosis not present

## 2014-09-03 DIAGNOSIS — E1165 Type 2 diabetes mellitus with hyperglycemia: Secondary | ICD-10-CM | POA: Diagnosis not present

## 2014-09-03 DIAGNOSIS — E039 Hypothyroidism, unspecified: Secondary | ICD-10-CM | POA: Diagnosis not present

## 2014-09-04 ENCOUNTER — Other Ambulatory Visit: Payer: Self-pay | Admitting: Neurology

## 2014-09-04 DIAGNOSIS — E291 Testicular hypofunction: Secondary | ICD-10-CM | POA: Diagnosis not present

## 2014-09-04 MED ORDER — TRAMADOL HCL 50 MG PO TABS: 50.0000 mg | ORAL_TABLET | Freq: Every evening | ORAL | Status: DC | PRN

## 2014-09-04 NOTE — Telephone Encounter (Signed)
Patient of Dohmeier/MM, both are out of the office.  Request forwarded to [facility] for review.

## 2014-09-04 NOTE — Telephone Encounter (Signed)
Patient requesting refill of tramadol 50 mg - Best contact  [phone].

## 2014-09-09 ENCOUNTER — Other Ambulatory Visit: Payer: Self-pay | Admitting: Family Medicine

## 2014-09-18 DIAGNOSIS — E291 Testicular hypofunction: Secondary | ICD-10-CM | POA: Diagnosis not present

## 2014-10-05 ENCOUNTER — Telehealth: Payer: Self-pay

## 2014-10-05 NOTE — Telephone Encounter (Signed)
Pt states that he spoke with Dr [name] on 10-03-14 and his medication was changed du to high BG readings. His readings have been high since then.  10-04-14   240  289   239   242 10-05-14   244   232  He is now taking Levemir 80 units qhs & Humalog 20 units TIDAC + SSI

## 2014-10-05 NOTE — Telephone Encounter (Signed)
KIM, We do advise patient to increase Levemir to 100 units daily at bedtime and Humalog to 25 units 3 times a day before meals plus correction.

## 2014-10-06 ENCOUNTER — Encounter: Payer: Self-pay | Admitting: Family Medicine

## 2014-10-06 ENCOUNTER — Other Ambulatory Visit: Payer: Self-pay | Admitting: Diagnostic Neuroimaging

## 2014-10-06 ENCOUNTER — Ambulatory Visit (INDEPENDENT_AMBULATORY_CARE_PROVIDER_SITE_OTHER): Payer: Medicare Other | Admitting: Family Medicine

## 2014-10-06 VITALS — BP 120/80 | Temp 98.5°F | Ht 65.0 in | Wt 206.0 lb

## 2014-10-06 DIAGNOSIS — L02419 Cutaneous abscess of limb, unspecified: Secondary | ICD-10-CM | POA: Diagnosis not present

## 2014-10-06 DIAGNOSIS — N189 Chronic kidney disease, unspecified: Secondary | ICD-10-CM | POA: Diagnosis not present

## 2014-10-06 DIAGNOSIS — L03119 Cellulitis of unspecified part of limb: Secondary | ICD-10-CM

## 2014-10-06 DIAGNOSIS — Z23 Encounter for immunization: Secondary | ICD-10-CM

## 2014-10-06 DIAGNOSIS — I2510 Atherosclerotic heart disease of native coronary artery without angina pectoris: Secondary | ICD-10-CM

## 2014-10-06 MED ORDER — MUPIROCIN 2 % EX OINT
1.0000 | TOPICAL_OINTMENT | Freq: Two times a day (BID) | CUTANEOUS | Status: DC
Start: 2014-10-06 — End: 2014-12-03

## 2014-10-06 MED ORDER — CEPHALEXIN 500 MG PO CAPS: 500.0000 mg | ORAL_CAPSULE | Freq: Three times a day (TID) | ORAL | Status: DC

## 2014-10-06 NOTE — Progress Notes (Signed)
   Subjective:    Patient ID: Paul Baker, male    DOB: 1950-10-13, 64 y.o.   MRN: [id number]  HPI Patient is here today for a blister on his left pinky toe. Patient states that it has been present for about 1 week now. Treatments tried: Neosporin. No relief.   Neosporin intermittently   Usee a band dai   Patient states that he has no other concerns at this time.  Patient has sensory neuropathy from diabetes. Did a lot of walking. Developed an irritated area. Some redness and swelling.  Review of Systems No fever no chills no rash    Objective:   Physical Exam Alert vitals stable lungs clear. Heart regular rhythm. Lateral toe irritated rash some secondary superficial infection slight erythema and tenderness       Assessment & Plan:  Impression toe abrasion at site of sensory neuropathy with early cellulitis and antibiotics prescribed. Local measures discussed warning signs discussed WSL

## 2014-10-06 NOTE — Telephone Encounter (Signed)
Pt.notified

## 2014-10-07 ENCOUNTER — Other Ambulatory Visit: Payer: Self-pay

## 2014-10-07 DIAGNOSIS — E291 Testicular hypofunction: Secondary | ICD-10-CM | POA: Diagnosis not present

## 2014-10-07 MED ORDER — TRAMADOL HCL 50 MG PO TABS: 50.0000 mg | ORAL_TABLET | Freq: Every evening | ORAL | Status: DC | PRN

## 2014-10-07 NOTE — Telephone Encounter (Signed)
Rx signed and faxed.

## 2014-10-12 DIAGNOSIS — E291 Testicular hypofunction: Secondary | ICD-10-CM | POA: Diagnosis not present

## 2014-10-20 ENCOUNTER — Other Ambulatory Visit: Payer: Self-pay | Admitting: "Endocrinology

## 2014-10-20 ENCOUNTER — Other Ambulatory Visit: Payer: Self-pay | Admitting: Cardiology

## 2014-10-20 DIAGNOSIS — E291 Testicular hypofunction: Secondary | ICD-10-CM | POA: Diagnosis not present

## 2014-10-21 DIAGNOSIS — E291 Testicular hypofunction: Secondary | ICD-10-CM | POA: Diagnosis not present

## 2014-11-02 DIAGNOSIS — Z029 Encounter for administrative examinations, unspecified: Secondary | ICD-10-CM

## 2014-11-03 DIAGNOSIS — Z96651 Presence of right artificial knee joint: Secondary | ICD-10-CM | POA: Diagnosis not present

## 2014-11-03 DIAGNOSIS — Z471 Aftercare following joint replacement surgery: Secondary | ICD-10-CM | POA: Diagnosis not present

## 2014-11-04 ENCOUNTER — Other Ambulatory Visit: Payer: Self-pay | Admitting: "Endocrinology

## 2014-11-04 DIAGNOSIS — E291 Testicular hypofunction: Secondary | ICD-10-CM | POA: Diagnosis not present

## 2014-11-05 ENCOUNTER — Ambulatory Visit
Admission: RE | Admit: 2014-11-05 | Discharge: 2014-11-05 | Disposition: A | Discharge status: Home / Self Care | Payer: Medicare Other | Source: Ambulatory Visit | Attending: Orthopedic Surgery | Admitting: Orthopedic Surgery

## 2014-11-05 ENCOUNTER — Other Ambulatory Visit: Payer: Self-pay | Admitting: Orthopedic Surgery

## 2014-11-05 DIAGNOSIS — M25561 Pain in right knee: Secondary | ICD-10-CM

## 2014-11-05 DIAGNOSIS — S86811A Strain of other muscle(s) and tendon(s) at lower leg level, right leg, initial encounter: Secondary | ICD-10-CM | POA: Diagnosis not present

## 2014-11-05 DIAGNOSIS — Z96651 Presence of right artificial knee joint: Secondary | ICD-10-CM | POA: Diagnosis not present

## 2014-11-05 DIAGNOSIS — Z471 Aftercare following joint replacement surgery: Secondary | ICD-10-CM | POA: Diagnosis not present

## 2014-11-11 DIAGNOSIS — Z96651 Presence of right artificial knee joint: Secondary | ICD-10-CM | POA: Diagnosis not present

## 2014-11-11 DIAGNOSIS — S86811A Strain of other muscle(s) and tendon(s) at lower leg level, right leg, initial encounter: Secondary | ICD-10-CM | POA: Diagnosis not present

## 2014-11-11 DIAGNOSIS — N189 Chronic kidney disease, unspecified: Secondary | ICD-10-CM | POA: Diagnosis not present

## 2014-11-12 ENCOUNTER — Telehealth: Payer: Self-pay | Admitting: Neurology

## 2014-11-12 ENCOUNTER — Ambulatory Visit (INDEPENDENT_AMBULATORY_CARE_PROVIDER_SITE_OTHER): Payer: Medicare Other | Admitting: Neurology

## 2014-11-12 ENCOUNTER — Encounter: Payer: Self-pay | Admitting: Neurology

## 2014-11-12 VITALS — BP 140/82 | HR 80 | Resp 20 | Ht 65.0 in | Wt 211.0 lb

## 2014-11-12 DIAGNOSIS — G4733 Obstructive sleep apnea (adult) (pediatric): Secondary | ICD-10-CM | POA: Diagnosis not present

## 2014-11-12 DIAGNOSIS — G2581 Restless legs syndrome: Secondary | ICD-10-CM | POA: Diagnosis not present

## 2014-11-12 DIAGNOSIS — I2510 Atherosclerotic heart disease of native coronary artery without angina pectoris: Secondary | ICD-10-CM

## 2014-11-12 DIAGNOSIS — G253 Myoclonus: Secondary | ICD-10-CM

## 2014-11-12 DIAGNOSIS — Z9989 Dependence on other enabling machines and devices: Secondary | ICD-10-CM

## 2014-11-12 MED ORDER — HYDROMORPHONE HCL 2 MG PO TABS: 1.0000 mg | ORAL_TABLET | ORAL | Status: DC | PRN

## 2014-11-12 MED ORDER — ARMODAFINIL 200 MG PO TABS: 200.0000 mg | ORAL_TABLET | Freq: Every morning | ORAL | Status: DC

## 2014-11-12 MED ORDER — ROPINIROLE HCL 4 MG PO TABS: ORAL_TABLET | ORAL | Status: DC

## 2014-11-12 MED ORDER — CLONAZEPAM 1 MG PO TABS: ORAL_TABLET | ORAL | Status: DC

## 2014-11-12 NOTE — Telephone Encounter (Signed)
Wells Guiles with [facility] called regarding referral for pt. Sts she did not receive demographics. Please call at [phone]

## 2014-11-12 NOTE — Patient Instructions (Signed)
Restless Legs Syndrome Restless legs syndrome is a condition that causes uncomfortable feelings or sensations in the legs, especially while sitting or lying down. The sensations usually cause an overwhelming urge to move the legs. The arms can also sometimes be affected. The condition can range from mild to severe. The symptoms often interfere with a person's ability to sleep. CAUSES The cause of this condition is not known. RISK FACTORS This condition is more likely to develop in:  People who are older than age 50.  Pregnant women. In general, restless legs syndrome is more common in women than in men.  People who have a family history of the condition.  People who have certain medical conditions, such as iron deficiency, kidney disease, Parkinson disease, or nerve damage.  People who take certain medicines, such as medicines for high blood pressure, nausea, colds, allergies, depression, and some heart conditions. SYMPTOMS The main symptom of this condition is uncomfortable sensations in the legs. These sensations may be:  Described as pulling, tingling, prickling, throbbing, crawling, or burning.  Worse while you are sitting or lying down.  Worse during periods of rest or inactivity.  Worse at night, often interfering with your sleep.  Accompanied by a very strong urge to move your legs.  Temporarily relieved by movement of your legs. The sensations usually affect both sides of the body. The arms can also be affected, but this is rare. People who have this condition often have tiredness during the day because of their lack of sleep at night. DIAGNOSIS This condition may be diagnosed based on your description of the symptoms. You may also have tests, including blood tests, to check for other conditions that may lead to your symptoms. In some cases, you may be asked to spend some time in a sleep lab so your sleeping can be monitored. TREATMENT Treatment for this condition is  focused on managing the symptoms. Treatment may include:  Self-help and lifestyle changes.  Medicines. HOME CARE INSTRUCTIONS  Take medicines only as directed by your health care provider.  Try these methods to get temporary relief from the uncomfortable sensations:  Massage your legs.  Walk or stretch.  Take a cold or hot bath.  Practice good sleep habits. For example, go to bed and get up at the same time every day.  Exercise regularly.  Practice ways of relaxing, such as yoga or meditation.  Avoid caffeine and alcohol.  Do not use any tobacco products, including cigarettes, chewing tobacco, or electronic cigarettes. If you need help quitting, ask your health care provider.  Keep all follow-up visits as directed by your health care provider. This is important. SEEK MEDICAL CARE IF: Your symptoms do not improve with treatment, or they get worse.   This information is not intended to replace advice given to you by your health care provider. Make sure you discuss any questions you have with your health care provider.   Document Released: 12/09/2001 Document Revised: 05/05/2014 Document Reviewed: 12/15/2013 Elsevier Interactive Patient Education 2016 [employer]  

## 2014-11-12 NOTE — Progress Notes (Signed)
PATIENT: Paul Baker DOB: [birth year]  REASON FOR VISIT: follow up for OSA on CPAP, Hypersomnia, persistent HISTORY FROM: patient  HISTORY OF PRESENT ILLNESS:    UPDATE 04/02/13 (LL): Mr. Mischke returns for follow up visit.  He brings his Cpap machine today and the download provides a reprot for 25 days.  Compliance is around 50% He has reduced compliance this time because he was in the hospitital twice for hyperglycemia and also he states his mask is not fitting well, but he does use it religiously.  He plans on getting a new one soon. He states that [name] is wearing off in the mid afternoons, around 2-3 pm.   He is so sleepy that he has fallen asleep during conversation. His Epworth Score today is 22. FSS is 43 points. He is exhausted, he is desperate. Rises at 4.30 AM and goes to bed at 10 PM. By midnight he wakes and often cannot resume sleep for several hours.   Compliance visit:  The download from 09-08-13 through 10-07-13 revealed an 11.5 cm water pressure need, average daily used to time 4 hours 41 minutes, the patient used the machine on 27 of 30 days. The patient used the machine 4 hours on 20 of 30 days. Compliance over 4 hours or 66%. Residual AHI of 0.5. This patient with severe obstructive sleep apnea followed by Dr. Krista Blue. AHI 62 titrated to 16 cm water. DME was [employer] , PCP Dr.-Luking.  Apnea cannot longer be the cause for his excessive daytime sleepiness, I will try to adjust his medication treatment for restless leg syndrome.   Interval history from 11-12-14 Mr. Corsino returns today for compliance visit on CPAP but also reports worsening of his restless leg syndrome. He has been 77% compliant with his CPAP use uses it on average 4 hours and 35 minutes. He is using an AutoSet between 5 and 16 cm water pressure, with a full-time  expiratory pressure relief of 3 cm water. His AHI is 3.06 for flying a very good control of his previously severe  obstructive sleep apnea. Sleep apnea is not the cause of his excessive daytime sleepiness anymore. His main problem is his restless legs which have been uncontrollable. He has tried to [employer], he has also tried Nuvigil in daytime to combat the daytime sleepiness. He states that the Provigil does not work as well as any longer. He had tried Requip and Mirapex in oral form. At this time he is fairly desperate. The restless limbs as I called him now has expanded to the upper extremities. He had been evaluated for iron deficiency, low ferritin, spinal stenosis peripheral vascular disease and neuropathy. He likely has an inherited form.    He reports for the interval medical history that he was told his renal clearance has decreased, he was also hospitalized for a right  knee replacement but in the meantime acquired a patella tendon tear. Left knee had been replaced in 2005. Dr. Vanetta Mulders is following him for his chronic kidney dysfunction. Today's Epworth sleepiness score is endorsed at 20 points, fatigue severity at 37 points and the geriatric depression score at 4 points. The [facility] restless leg syndrome quality of life questionnaire is endorsed at moderate to severe impairment. His RLS 6 rating scale is at the very severe impairment rating.   REVIEW OF SYSTEMS:  11-12-14   Full 14 system review of systems performed and notable only for:   Restless legs, limbs- expanded to  arms. See impairment degree above . Epworth 22, fatigue 37. Facial swelling, hearing loss, ringing in the ears, eye pain, cough, chest pain, leg swelling, restless legs, apnea, frequent waking, daytime sleepiness, snoring, sleep talking, sleep walking, acting out dreams, joint pain, joint swelling, back pain, aching muscles, muscle cramps, walking difficulty, coordination problems, dizziness, headache   HOME MEDICATIONS: Outpatient Prescriptions Prior to Visit  Medication Sig Dispense Refill  . amLODipine  (NORVASC) 5 MG tablet TAKE ONE TABLET BY MOUTH ONCE DAILY. 30 tablet 6  . Armodafinil (NUVIGIL) 50 MG tablet TAKE 3 TABLETS BY MOUTH IN THE MORNING AND 1 TABLET IN THE MID-AFTERNOON. 120 tablet 5  . clobetasol (TEMOVATE) 0.05 % external solution Apply 1 application topically daily as needed (irritation.).     [name] clonazePAM (KLONOPIN) 1 MG tablet TAKE (1) TABLET BY MOUTH AT BEDTIME FOR RESTLESS LEGS. 30 tablet 3  . docusate sodium (COLACE) 100 MG capsule Take 1 capsule (100 mg total) by mouth 2 (two) times daily. 10 capsule 0  . fenofibrate 160 MG tablet TAKE (1) TABLET BY MOUTH AT BEDTIME. 30 tablet 5  . ferrous sulfate 325 (65 FE) MG tablet Take 1 tablet (325 mg total) by mouth 3 (three) times daily after meals.  3  . HUMALOG KWIKPEN 200 UNIT/ML SOPN INJECT UP TO 20 UNITS THREE TIMES DAILY WITH MEALS AS DIRECTED. (Patient taking differently: INJECT UP TO 25 UNITS THREE TIMES DAILY WITH MEALS AS DIRECTED.) 6 mL 2  . isosorbide mononitrate (IMDUR) 60 MG 24 hr tablet TAKE (1/2) TABLET BY MOUTH TWICE DAILY. 90 tablet 3  . LEVEMIR 100 UNIT/ML injection Inject 100 Units into the skin daily.     [name] levothyroxine (SYNTHROID, LEVOTHROID) 112 MCG tablet TAKE (1) TABLET BY MOUTH EACH MORNING. 30 tablet 2  . loratadine (CLARITIN) 10 MG tablet Take 10 mg by mouth at bedtime as needed for allergies.     [name] losartan (COZAAR) 100 MG tablet TAKE ONE TABLET BY MOUTH ONCE DAILY. 30 tablet 4  . metoprolol succinate (TOPROL-XL) 50 MG 24 hr tablet TAKE (1) TABLET BY MOUTH TWICE DAILY. 60 tablet 5  . mupirocin ointment (BACTROBAN) 2 % Apply 1 application topically 2 (two) times daily. 30 g 0  . NITROSTAT 0.4 MG SL tablet DISSOLVE 1 TABLET UNDER TONGUE EVERY 5 MINUTES UP TO 15 MIN FOR CHESTPAIN. IF NO RELIEF CALL 911. 25 tablet 3  . omeprazole (PRILOSEC) 20 MG capsule TAKE (1) CAPSULE BY MOUTH TWICE DAILY. (Patient taking differently: TAKE (1) CAPSULE BY MOUTH TWICE DAILY as needed) 60 capsule 5  . ONE TOUCH ULTRA TEST test  strip 1 each by Other route. Four to five times daily.    . polyethylene glycol (MIRALAX / GLYCOLAX) packet Take 17 g by mouth 2 (two) times daily. (Patient taking differently: Take 17 g by mouth daily as needed. ) 14 each 0  . polyvinyl alcohol (LIQUIFILM TEARS) 1.4 % ophthalmic solution Place 1 drop into both eyes daily as needed for dry eyes.    . pravastatin (PRAVACHOL) 20 MG tablet TAKE ONE TABLET BY MOUTH ONCE DAILY. 30 tablet 4  . rOPINIRole (REQUIP) 4 MG tablet TAKE 1 TO 1 & 1/2 TABLETS BY MOUTH AT BEDTIME. 45 tablet 3  . sodium chloride (OCEAN) 0.65 % SOLN nasal spray Place 1-2 sprays into both nostrils daily as needed for congestion.    . Testosterone Cypionate & Prop 200-20 MG/ML SOLN Inject 1 mL into the muscle every 14 (fourteen) days.    [name]  traMADol (ULTRAM) 50 MG tablet Take 1 tablet (50 mg total) by mouth at bedtime as needed for severe pain (RLS). 15 tablet 1  . triamterene-hydrochlorothiazide (MAXZIDE-25) 37.5-25 MG per tablet TAKE ONE TABLET BY MOUTH ONCE DAILY. 30 tablet 5  . VENTOLIN HFA 108 (90 BASE) MCG/ACT inhaler Inhale 1 puff into the lungs every 4 (four) hours as needed for wheezing or shortness of breath.     . Vitamin D, Ergocalciferol, (DRISDOL) 50000 UNITS CAPS capsule TAKE 1 CAPSULE BY MOUTH ONCE A WEEK. 12 capsule 0  . cephALEXin (KEFLEX) 500 MG capsule Take 1 capsule (500 mg total) by mouth 3 (three) times daily. X 7 days 21 capsule 0  . HUMALOG 100 UNIT/ML injection Inject 25 Units into the skin 3 (three) times daily with meals. Patient is on a sliding scale    . methocarbamol (ROBAXIN) 500 MG tablet Take 1 tablet (500 mg total) by mouth every 6 (six) hours as needed for muscle spasms. 50 tablet 0   No facility-administered medications prior to visit.     PHYSICAL EXAM  Filed Vitals:   11/12/14 1049  BP: 140/82  Pulse: 80  Resp: 20  Height: 5\' 5"  (1.651 m)  Weight: 211 lb (95.709 kg)   Body mass index is 35.11 kg/(m^2).  General: The patient is awake,  alert and appears not in acute distress. The patient is well groomed.  Head: Normocephalic, atraumatic. Neck is supple. Cardiovascular: Regular rate and rhythm, without murmurs or carotid bruit, and without distended neck veins.  Respiratory: Lungs are clear to auscultation.  Skin: Without evidence of edema, or rash  Trunk: BMI is elevated , patient has normal posture.   Neurologic exam :  The patient is oriented to place and time.  Memory subjective described as intact. There is a normal attention span & concentration ability. Mood and affect are appropriate.  Cranial nerves:  Pupils are equal and briskly reactive to light. . Extraocular movements in vertical and horizontal planes intact and without nystagmus. Visual fields by finger perimetry are intact.  Hearing to finger rub intact. Facial sensation intact to fine touch. Facial motor strength is symmetric and tongue and uvula moves midline.  Motor exam: Normal tone and normal muscle bulk and symmetric normal strength in all extremities.the patient reports dropping objects, tingling in both hands.  Sensory: Fine touch, pinprick and vibration were tested in all extremities.  Coordination: Rapid alternating movements in the fingers/hands is tested and normal. Finger-to-nose maneuver tested and normal without evidence of ataxia, dysmetria or tremor.  Gait and station: Patient walks without assistive device , climb up to the exam table. Strength within normal limits. Stance is stable and normal.  Reflexes: Attenuated Reflexes, unusual for a patient with documented spinal stenosis.    ASSESSMENT AND PLAN   Visit duration today was 25 minutes with more than 50% of the face to face time dedicated to the discussion of the differential diagnosis restless legs with overlying spinal stenosis and with overlying diabetic neuropathy. The treatment of restless legs in the degree that this patient suffers is usually by narcotics. There is a high risk of a  patient that has also obstructive sleep apnea. Mr. Kitchell compliance with the CPAP use and this will protect him from a lowered respiratory drive. I would like for him to try Dilaudid hydromorphone 1 mg tablet at night . I would like him not to take the Klonopin and no other pain medication of any kind at the same time. I  discontinued the tramadol which had not been beneficial for him.  I refilled his dopaminergic agonist medications. I reviewed and discussed the CPAP download with the patient that provided 30 days of data ending 11-09-14. I like for him to continue using his CPAP every night over 4 hours as he currently does.     1)OSA - treated on CPAP with very good compliance and result for sleep apnea, documented in general CPAP downloads-. Residual AHI was 3.0 He needs a new mask and from  [facility]- Medicare    2)Restless leg syndrome - now restless limbs -with myoclonus, has not been responding well to increased doses of dopaminergic medication. Familiar form, strong family history.  He is now taking 6 mg of Requip and 2 mg of Klonopin. Failed baclofen, Neurontin, Mirapex, neupro patch. Father and daughter have RLS with Myoclonus, familiar form.   3)Spinal stenosis contributes to RLS. He had surgery lumbar stenosis surgery with Dr Joya Salm.     At the time a herniated disc and a bone spur or fragment were cleaned in 2007. But at the time the patient also suffered from a knee infection and therefore no hardware was installed.   He has been told that he is not a surgical candidate for any further back surgery or intervention.  4) excessive daytime sleepiness reflected in an Epworth score between 20 and 22 in spite of compliance with CPAP and attributed to restless limb syndrome. Nuvigil only a "band aid " lasting 4-5 hours only. Refilled Nuvigil but increased from 50 mg to the 200 mg tablet. The patient can break this Let it half and takes 100 mg in the morning and can take another 100 mg in the  afternoon.  5) diabetes contributed to some neuropathy. insulin dependent . Fingertips and toes and heels are numb to fine touch. This caused CKD grade  2.   6) CAD- on prn nitro.      Larey Seat , MD  11:06 AM [facility] [address] [place], [place] [address] [phone]    Cc Dr. Leonie Man,  Dr. Tamera Punt ,  Dr. Jonnie Finner

## 2014-11-12 NOTE — Telephone Encounter (Signed)
Called Wells Guiles back left her a message inference to fax number. Fax number on Line faxed to [phone].

## 2014-11-18 DIAGNOSIS — E1122 Type 2 diabetes mellitus with diabetic chronic kidney disease: Secondary | ICD-10-CM | POA: Diagnosis not present

## 2014-11-18 DIAGNOSIS — E039 Hypothyroidism, unspecified: Secondary | ICD-10-CM | POA: Diagnosis not present

## 2014-11-18 DIAGNOSIS — E291 Testicular hypofunction: Secondary | ICD-10-CM | POA: Diagnosis not present

## 2014-11-18 DIAGNOSIS — I10 Essential (primary) hypertension: Secondary | ICD-10-CM | POA: Diagnosis not present

## 2014-11-18 DIAGNOSIS — E785 Hyperlipidemia, unspecified: Secondary | ICD-10-CM | POA: Diagnosis not present

## 2014-11-18 LAB — HEMOGLOBIN A1C: Hgb A1c MFr Bld: 9.1 % — AB (ref 4.0–6.0)

## 2014-11-25 ENCOUNTER — Encounter: Payer: Self-pay | Admitting: "Endocrinology

## 2014-11-25 ENCOUNTER — Ambulatory Visit (INDEPENDENT_AMBULATORY_CARE_PROVIDER_SITE_OTHER): Payer: Medicare Other | Admitting: "Endocrinology

## 2014-11-25 VITALS — BP 136/78 | HR 62 | Ht 66.0 in | Wt 210.0 lb

## 2014-11-25 DIAGNOSIS — Z794 Long term (current) use of insulin: Secondary | ICD-10-CM | POA: Diagnosis not present

## 2014-11-25 DIAGNOSIS — E1159 Type 2 diabetes mellitus with other circulatory complications: Secondary | ICD-10-CM | POA: Diagnosis not present

## 2014-11-25 DIAGNOSIS — I2510 Atherosclerotic heart disease of native coronary artery without angina pectoris: Secondary | ICD-10-CM

## 2014-11-25 DIAGNOSIS — E785 Hyperlipidemia, unspecified: Secondary | ICD-10-CM

## 2014-11-25 DIAGNOSIS — E038 Other specified hypothyroidism: Secondary | ICD-10-CM | POA: Diagnosis not present

## 2014-11-25 DIAGNOSIS — E1165 Type 2 diabetes mellitus with hyperglycemia: Secondary | ICD-10-CM

## 2014-11-25 DIAGNOSIS — I10 Essential (primary) hypertension: Secondary | ICD-10-CM | POA: Diagnosis not present

## 2014-11-25 DIAGNOSIS — IMO0002 Reserved for concepts with insufficient information to code with codable children: Secondary | ICD-10-CM

## 2014-11-25 DIAGNOSIS — E782 Mixed hyperlipidemia: Secondary | ICD-10-CM | POA: Insufficient documentation

## 2014-11-25 DIAGNOSIS — E1169 Type 2 diabetes mellitus with other specified complication: Secondary | ICD-10-CM | POA: Insufficient documentation

## 2014-11-25 NOTE — Progress Notes (Signed)
Subjective:    Patient ID: Paul Baker, male    DOB: Mar 11, 1950, PCP [name], MD   Past Medical History  Diagnosis Date  . Arthritis   . Type 2 diabetes mellitus ([place])   . Mixed hyperlipidemia   . Essential hypertension, benign   . Septic arthritis of knee, left ([place])   . OSA (obstructive sleep apnea)   . Coronary atherosclerosis of native coronary artery     Diseased nondominant RCA  . Pulmonary embolism ([place]) 2004  . DVT (deep venous thrombosis) ([place]) 2005    Right arm  . Rotator cuff disorder     Left  . Colon polyp   . Morbid obesity ([place])   . RLS (restless legs syndrome)   . Hypersomnia     CPAP of 16 cm, diagnosed with AHI of 60 in 2012,epworth 21- narcolepsy?  [name] MRSA (methicillin resistant staph aureus) culture positive     08/2012  . Narcolepsy   . Asthma   . Headache   . History of nonmelanoma skin cancer   . Psoriasis   . History of transfusion   . PE (pulmonary embolism) 1982  . Hypothyroidism    Past Surgical History  Procedure Laterality Date  . Total knee arthroplasty  2003    LEFT  . Lumbar disc surgery      Left L3, L4, L5 discecotomy with decompression of L4 root  . Wrist surgery    . Total knee revision  2005    LEFT  . Colonoscopy    . Back surgery    . Knee surgery      X 25 TIMES  . Total knee arthroplasty Right 03/23/2014    Procedure: RIGHT TOTAL KNEE ARTHROPLASTY AND REMOVAL RIGHT TIBIAL  DEEP IMPLANT STAPLE;  Surgeon: Paralee Cancel, MD;  Location: [facility];  Service: Orthopedics;  Laterality: Right;   Social History   Social History  . Marital Status: Married    Spouse Name: Toney Reil  . Number of Children: 2  . Years of Education: college   Occupational History  . Disabled   .     Social History Main Topics  . Smoking status: Former Smoker    Types: Cigarettes    Start date: 06/19/1967    Quit date: 01/03/1995  . Smokeless tobacco: Never Used  . Alcohol Use: No     Comment: quit drinking in 07/86  . Drug Use: No   . Sexual Activity:    Partners: Female   Other Topics Concern  . None   Social History Narrative    64 year old, right-handed, caucasian male with a past medical history of obesity, hypertension, hyperlipidemia, diabetes, obstructive sleep apnea, presenting with frequent nighttime awakenings, excessive daytime sleepiness, also transient confusional episodes.RLS and one beosity, OSA on CPAP with AHI of 3.2 and  setting of 16 cm water , [facility] .   Outpatient Encounter Prescriptions as of 11/25/2014  Medication Sig  . amLODipine (NORVASC) 5 MG tablet TAKE ONE TABLET BY MOUTH ONCE DAILY.  [name] Armodafinil 200 MG TABS Take 200 mg by mouth AC breakfast.  . clobetasol (TEMOVATE) 0.05 % external solution Apply 1 application topically daily as needed (irritation.).   [name] clonazePAM (KLONOPIN) 1 MG tablet TAKE (1) TABLET BY MOUTH AT BEDTIME FOR RESTLESS LEGS.  [name] docusate sodium (COLACE) 100 MG capsule Take 1 capsule (100 mg total) by mouth 2 (two) times daily.  . fenofibrate 160 MG tablet TAKE (1) TABLET BY MOUTH AT BEDTIME.  [name]  ferrous sulfate 325 (65 FE) MG tablet Take 1 tablet (325 mg total) by mouth 3 (three) times daily after meals.  [name] HUMALOG KWIKPEN 200 UNIT/ML SOPN INJECT UP TO 20 UNITS THREE TIMES DAILY WITH MEALS AS DIRECTED. (Patient taking differently: INJECT UP TO 25 UNITS THREE TIMES DAILY WITH MEALS AS DIRECTED.)  . HYDROmorphone (DILAUDID) 2 MG tablet Take 0.5 tablets (1 mg total) by mouth every 4 (four) hours as needed for moderate pain.  . isosorbide mononitrate (IMDUR) 60 MG 24 hr tablet TAKE (1/2) TABLET BY MOUTH TWICE DAILY.  [name] LEVEMIR 100 UNIT/ML injection Inject 100 Units into the skin daily.   [name] levothyroxine (SYNTHROID, LEVOTHROID) 112 MCG tablet TAKE (1) TABLET BY MOUTH EACH MORNING.  [name] loratadine (CLARITIN) 10 MG tablet Take 10 mg by mouth at bedtime as needed for allergies.   [name] losartan (COZAAR) 100 MG tablet TAKE ONE TABLET BY MOUTH ONCE DAILY.  . metoprolol succinate  (TOPROL-XL) 50 MG 24 hr tablet TAKE (1) TABLET BY MOUTH TWICE DAILY.  . mupirocin ointment (BACTROBAN) 2 % Apply 1 application topically 2 (two) times daily.  [name] NITROSTAT 0.4 MG SL tablet DISSOLVE 1 TABLET UNDER TONGUE EVERY 5 MINUTES UP TO 15 MIN FOR CHESTPAIN. IF NO RELIEF CALL 911.  . omeprazole (PRILOSEC) 20 MG capsule TAKE (1) CAPSULE BY MOUTH TWICE DAILY. (Patient taking differently: TAKE (1) CAPSULE BY MOUTH TWICE DAILY as needed)  . ONE TOUCH ULTRA TEST test strip 1 each by Other route. Four to five times daily.  . polyethylene glycol (MIRALAX / GLYCOLAX) packet Take 17 g by mouth 2 (two) times daily. (Patient taking differently: Take 17 g by mouth daily as needed. )  . polyvinyl alcohol (LIQUIFILM TEARS) 1.4 % ophthalmic solution Place 1 drop into both eyes daily as needed for dry eyes.  . pravastatin (PRAVACHOL) 20 MG tablet TAKE ONE TABLET BY MOUTH ONCE DAILY.  [name] rOPINIRole (REQUIP) 4 MG tablet TAKE 1 TO 1 & 1/2 TABLETS BY MOUTH AT BEDTIME.  . sodium chloride (OCEAN) 0.65 % SOLN nasal spray Place 1-2 sprays into both nostrils daily as needed for congestion.  . Testosterone Cypionate & Prop 200-20 MG/ML SOLN Inject 1 mL into the muscle every 14 (fourteen) days.  [name] triamterene-hydrochlorothiazide (MAXZIDE-25) 37.5-25 MG per tablet TAKE ONE TABLET BY MOUTH ONCE DAILY.  . VENTOLIN HFA 108 (90 BASE) MCG/ACT inhaler Inhale 1 puff into the lungs every 4 (four) hours as needed for wheezing or shortness of breath.   . Vitamin D, Ergocalciferol, (DRISDOL) 50000 UNITS CAPS capsule TAKE 1 CAPSULE BY MOUTH ONCE A WEEK.   No facility-administered encounter medications on file as of 11/25/2014.   ALLERGIES: Allergies  Allergen Reactions  . Cardizem [Diltiazem Hcl]     Edema   . Cardura [Doxazosin Mesylate]     Side effects  . Lipitor [Atorvastatin] Other (See Comments)    Leg cramps  . Paxil [Paroxetine Hcl]     Side effects   . Adhesive [Tape] Rash  . Codeine Rash and Other (See Comments)     Headache    VACCINATION STATUS: Immunization History  Administered Date(s) Administered  . Influenza Split 09/18/2012  . Influenza,inj,Quad PF,36+ Mos 10/13/2013, 10/06/2014  . Pneumococcal Polysaccharide-23 10/02/2004, 10/03/2011  . Td 05/24/2006    Diabetes He presents for his follow-up diabetic visit. He has type 2 diabetes mellitus. Onset time: Was diagnosed at approximate age of 54 years. His disease course has been improving. There are no hypoglycemic associated symptoms. Pertinent  negatives for hypoglycemia include no confusion, headaches, pallor or seizures. Associated symptoms include polydipsia and polyuria. Pertinent negatives for diabetes include no chest pain, no fatigue, no polyphagia and no weakness. There are no hypoglycemic complications. Symptoms are improving. Diabetic complications include heart disease. Risk factors for coronary artery disease include diabetes mellitus, dyslipidemia, male sex, obesity, sedentary lifestyle and tobacco exposure. Current diabetic treatment includes intensive insulin program. He is compliant with treatment most of the time. His weight is stable. He is following a generally unhealthy diet. He has had a previous visit with a dietitian. He never participates in exercise. His home blood glucose trend is fluctuating minimally. His overall blood glucose range is >200 mg/dl. An ACE inhibitor/angiotensin II receptor blocker is being taken. He sees a podiatrist.Eye exam is current.  Hyperlipidemia This is a chronic problem. The current episode started more than 1 year ago. Pertinent negatives include no chest pain, myalgias or shortness of breath. Current antihyperlipidemic treatment includes statins. Risk factors for coronary artery disease include dyslipidemia, diabetes mellitus, hypertension, male sex, obesity and a sedentary lifestyle.  Hypertension This is a chronic problem. The current episode started more than 1 year ago. Pertinent negatives  include no chest pain, headaches, neck pain, palpitations or shortness of breath. Past treatments include angiotensin blockers. Hypertensive end-organ damage includes CAD/MI and a thyroid problem.  Thyroid Problem Presents for follow-up visit. Patient reports no constipation, diarrhea, fatigue or palpitations. The symptoms have been improving. Past treatments include levothyroxine. His past medical history is significant for hyperlipidemia.     Review of Systems  Constitutional: Negative for fatigue and unexpected weight change.  HENT: Negative for dental problem, mouth sores and trouble swallowing.   Eyes: Negative for visual disturbance.  Respiratory: Negative for cough, choking, chest tightness, shortness of breath and wheezing.   Cardiovascular: Negative for chest pain, palpitations and leg swelling.  Gastrointestinal: Negative for nausea, vomiting, abdominal pain, diarrhea, constipation and abdominal distention.  Endocrine: Positive for polydipsia and polyuria. Negative for polyphagia.  Genitourinary: Negative for dysuria, urgency, hematuria and flank pain.  Musculoskeletal: Negative for myalgias, back pain, gait problem and neck pain.  Skin: Negative for pallor, rash and wound.  Neurological: Negative for seizures, syncope, weakness, numbness and headaches.  Psychiatric/Behavioral: Negative.  Negative for confusion and dysphoric mood.    Objective:    BP 136/78 mmHg  Pulse 62  Ht 5\' 6"  (1.676 m)  Wt 210 lb (95.255 kg)  BMI 33.91 kg/m2  SpO2 96%  Wt Readings from Last 3 Encounters:  11/25/14 210 lb (95.255 kg)  11/12/14 211 lb (95.709 kg)  10/06/14 206 lb (93.441 kg)    Physical Exam  Constitutional: He is oriented to person, place, and time. He appears well-developed and well-nourished. He is cooperative. No distress.  HENT:  Head: Normocephalic and atraumatic.  Eyes: EOM are normal.  Neck: Normal range of motion. Neck supple. No tracheal deviation present. No thyromegaly  present.  Cardiovascular: Normal rate, S1 normal, S2 normal and normal heart sounds.  Exam reveals no gallop.   No murmur heard. Pulses:      Dorsalis pedis pulses are 1+ on the right side, and 1+ on the left side.       Posterior tibial pulses are 1+ on the right side, and 1+ on the left side.  Pulmonary/Chest: Breath sounds normal. No respiratory distress. He has no wheezes.  Abdominal: Soft. Bowel sounds are normal. He exhibits no distension. There is no tenderness. There is no guarding and no  CVA tenderness.  Musculoskeletal: He exhibits no edema.       Right shoulder: He exhibits no swelling and no deformity.  Neurological: He is alert and oriented to person, place, and time. He has normal strength and normal reflexes. No cranial nerve deficit or sensory deficit. Gait normal.  Skin: Skin is warm and dry. No rash noted. No cyanosis. Nails show no clubbing.  Psychiatric: He has a normal mood and affect. His speech is normal and behavior is normal. Judgment and thought content normal. Cognition and memory are normal.    Results for orders placed or performed in visit on 11/25/14  Hemoglobin A1c  Result Value Ref Range   Hgb A1c MFr Bld 9.1 (A) 4.0 - 6.0 %  Hemoglobin A1c  Result Value Ref Range   Hgb A1c MFr Bld 9.8 (A) 4.0 - 6.0 %   Complete Blood Count (Most recent): Lab Results  Component Value Date   WBC 7.6 03/24/2014   HGB 10.2* 03/24/2014   HCT 32.7* 03/24/2014   MCV 95.1 03/24/2014   PLT 206 03/24/2014   Chemistry (most recent): Lab Results  Component Value Date   NA 133* 03/24/2014   K 4.9 03/24/2014   CL 99 03/24/2014   CO2 28 03/24/2014   BUN 28* 03/24/2014   CREATININE 1.43* 03/24/2014   Diabetic Labs (most recent): Lab Results  Component Value Date   HGBA1C 9.1* 11/18/2014   HGBA1C 9.8* 08/20/2014   HGBA1C 8.5 07/23/2012   Lipid profile (most recent): Lab Results  Component Value Date   TRIG 191* 08/20/2014   CHOL 154 08/20/2014     Assessment &  Plan:   1. Uncontrolled type 2 diabetes mellitus with other circulatory complication, with long-term current use of insulin (HCC)   -His diabetes is  complicated by ordinary artery disease and patient remains at a high risk for more acute and chronic complications of diabetes which include CAD, CVA, CKD, retinopathy, and neuropathy. These are all discussed in detail with the patient.  Patient came with elevated glucose profile despite large dose of insulin, and  recent A1c of 9.1% generally improving from 9.8 %.  Glucose logs and insulin administration records pertaining to this visit,  to be scanned into patient's records.  Recent labs reviewed.   - I have re-counseled the patient on diet management and weight loss  by adopting a carbohydrate restricted / protein rich  Diet.  - Suggestion is made for patient to avoid simple carbohydrates   from their diet including Cakes , Desserts, Ice Cream,  Soda (  diet and regular) , Sweet Tea , Candies,  Chips, Cookies, Artificial Sweeteners,   and "Sugar-free" Products .  This will help patient to have stable blood glucose profile and potentially avoid unintended  Weight gain.  - Patient is advised to stick to a routine mealtimes to eat 3 meals  a day and avoid unnecessary snacks ( to snack only to correct hypoglycemia).  - The patient  has been  scheduled with Jearld Fenton, RDN, CDE for individualized DM education.  - I have approached patient with the following individualized plan to manage diabetes and patient agrees.  -Continue Levemir 100 units daily at bedtime , and Humalog u200 to 25 units TIDAC plus SSI associated with monitoring of BG ac and hs.   -Patient is encouraged to call clinic for blood glucose levels less than 70 or above 300 mg /dl. - I wiresume low-dose metformin 500 mg by mouth twice a day.  -  Patient will be considered for incretin therapy as appropriate next visit. - Patient specific target  for A1c; LDL, HDL, Triglycerides,  and  Waist Circumference were discussed in detail.  2) BP/HTN: Controlled. Continue current medications including ACEI/ARB. 3) Lipids/HPL:  continue statins. 4)  Weight/Diet: CDE consult in progress, exercise, and carbohydrates information provided. 5)Other specified hypothyroidism -Continue levothyroxine 112 g by mouth every morning.  - We discussed about correct intake of levothyroxine, at fasting, with water, separated by at least 30 minutes from breakfast, and separated by more than 4 hours from calcium, iron, multivitamins, acid reflux medications (PPIs). -Patient is made aware of the fact that thyroid hormone replacement is needed for life, dose to be adjusted by periodic monitoring of thyroid function tests. 6) Chronic Care/Health Maintenance:  -Patient is on ACEI/ARB and Statin medications and encouraged to continue to follow up with Ophthalmology, Podiatrist at least yearly or according to recommendations, and advised to  stay away from smoking. I have recommended yearly flu vaccine and pneumonia vaccination at least every 5 years; moderate intensity exercise for up to 150 minutes weekly; and  sleep for at least 7 hours a day.  - 25 minutes of time was spent on the care of this patient , 50% of which was applied for counseling on diabetes complications and their preventions.  - I advised patient to maintain close follow up with [name], MD for primary care needs.  Patient is asked to bring meter and  blood glucose logs during their next visit.   Follow up plan: -Return in about 3 months (around 02/25/2015) for diabetes, high blood pressure, high cholesterol, underactive thyroid.  Glade Lloyd, MD Phone: [phone]  Fax: [phone]   11/25/2014, 6:54 PM

## 2014-11-25 NOTE — Patient Instructions (Signed)

## 2014-11-27 ENCOUNTER — Other Ambulatory Visit: Payer: Self-pay | Admitting: Adult Health

## 2014-11-27 ENCOUNTER — Other Ambulatory Visit: Payer: Self-pay | Admitting: "Endocrinology

## 2014-11-30 ENCOUNTER — Other Ambulatory Visit: Payer: Self-pay

## 2014-11-30 DIAGNOSIS — G253 Myoclonus: Secondary | ICD-10-CM

## 2014-11-30 DIAGNOSIS — M25561 Pain in right knee: Secondary | ICD-10-CM | POA: Diagnosis not present

## 2014-11-30 DIAGNOSIS — Z471 Aftercare following joint replacement surgery: Secondary | ICD-10-CM | POA: Diagnosis not present

## 2014-11-30 DIAGNOSIS — Z96651 Presence of right artificial knee joint: Secondary | ICD-10-CM | POA: Diagnosis not present

## 2014-11-30 DIAGNOSIS — G2581 Restless legs syndrome: Principal | ICD-10-CM

## 2014-11-30 MED ORDER — CLONAZEPAM 1 MG PO TABS: ORAL_TABLET | ORAL | Status: DC

## 2014-11-30 NOTE — Telephone Encounter (Signed)
Rx signed and faxed.

## 2014-12-02 NOTE — H&P (Signed)
Paul Baker is an 64 y.o. male.    Chief Complaint:    Right patella tendon rupture s/p TKA   Procedure:    Right patella tendon reconstruction / repair  HPI: Pt is a 64 y.o. male complaining of right knee pain.   Pt states that in August he was getting out of an elevated truck, when he fell and hit his knee on the step for the truck.  He also states that in September he was in his yard and the knee buckled on him.  He has had continual pain in the right knee.  He does have a history of a right TKA per Dr. Alvan Dame in March of 2016.  He has been using a Bledsoe brace, and analgesic medications.   He states that his patella rides high with flexion of the knee. Various options are discussed with the patient. Risks, benefits and expectations were discussed with the patient. Patient understand the risks, benefits and expectations and wishes to proceed with surgery.   PCP: [name], MD  D/C Plans:      Home with HHPT  Post-op Meds:       No Rx given  Tranexamic Acid:      To be given - topically  (previous PEs)  Decadron:      Is to be given  FYI:     Xarelto  Dilaudid (on pre-op)  CPAP   PMH: Past Medical History  Diagnosis Date  . Arthritis   . Type 2 diabetes mellitus ([place])   . Mixed hyperlipidemia   . Essential hypertension, benign   . Septic arthritis of knee, left ([place])   . OSA (obstructive sleep apnea)   . Coronary atherosclerosis of native coronary artery     Diseased nondominant RCA  . Pulmonary embolism ([place]) 2004  . DVT (deep venous thrombosis) ([place]) 2005    Right arm  . Rotator cuff disorder     Left  . Colon polyp   . Morbid obesity ([place])   . RLS (restless legs syndrome)   . Hypersomnia     CPAP of 16 cm, diagnosed with AHI of 60 in 2012,epworth 21- narcolepsy?  [name] MRSA (methicillin resistant staph aureus) culture positive     08/2012  . Narcolepsy   . Asthma   . Headache   . History of nonmelanoma skin cancer   . Psoriasis   . History of  transfusion   . PE (pulmonary embolism) 1982  . Hypothyroidism     PSH: Past Surgical History  Procedure Laterality Date  . Total knee arthroplasty  2003    LEFT  . Lumbar disc surgery      Left L3, L4, L5 discecotomy with decompression of L4 root  . Wrist surgery    . Total knee revision  2005    LEFT  . Colonoscopy    . Back surgery    . Knee surgery      X 25 TIMES  . Total knee arthroplasty Right 03/23/2014    Procedure: RIGHT TOTAL KNEE ARTHROPLASTY AND REMOVAL RIGHT TIBIAL  DEEP IMPLANT STAPLE;  Surgeon: Paralee Cancel, MD;  Location: [facility];  Service: Orthopedics;  Laterality: Right;    Social History:  reports that he quit smoking about 19 years ago. His smoking use included Cigarettes. He started smoking about 47 years ago. He has never used smokeless tobacco. He reports that he does not drink alcohol or use illicit drugs.  Allergies:  Allergies  Allergen  Reactions  . Cardizem [Diltiazem Hcl]     Edema   . Cardura [Doxazosin Mesylate]     Side effects  . Lipitor [Atorvastatin] Other (See Comments)    Leg cramps  . Paxil [Paroxetine Hcl]     Side effects   . Adhesive [Tape] Rash  . Codeine Rash and Other (See Comments)    Headache     Medications: No current facility-administered medications for this encounter.   Current Outpatient Prescriptions  Medication Sig Dispense Refill  . amLODipine (NORVASC) 5 MG tablet TAKE ONE TABLET BY MOUTH ONCE DAILY. 30 tablet 6  . Armodafinil 200 MG TABS Take 200 mg by mouth AC breakfast. 30 tablet 5  . clobetasol (TEMOVATE) 0.05 % external solution Apply 1 application topically daily as needed (irritation.).     [name] clonazePAM (KLONOPIN) 1 MG tablet TAKE (1) TABLET BY MOUTH AT BEDTIME FOR RESTLESS LEGS. 30 tablet 5  . docusate sodium (COLACE) 100 MG capsule Take 1 capsule (100 mg total) by mouth 2 (two) times daily. 10 capsule 0  . fenofibrate 160 MG tablet TAKE (1) TABLET BY MOUTH AT BEDTIME. 30 tablet 5  . ferrous sulfate 325  (65 FE) MG tablet Take 1 tablet (325 mg total) by mouth 3 (three) times daily after meals.  3  . HUMALOG KWIKPEN 200 UNIT/ML SOPN INJECT UP TO 20 UNITS THREE TIMES DAILY WITH MEALS AS DIRECTED. (Patient taking differently: INJECT UP TO 25 UNITS THREE TIMES DAILY WITH MEALS AS DIRECTED.) 6 mL 2  . HYDROmorphone (DILAUDID) 2 MG tablet Take 0.5 tablets (1 mg total) by mouth every 4 (four) hours as needed for moderate pain. 21 tablet 0  . isosorbide mononitrate (IMDUR) 60 MG 24 hr tablet TAKE (1/2) TABLET BY MOUTH TWICE DAILY. 90 tablet 3  . LEVEMIR FLEXTOUCH 100 UNIT/ML Pen INJECT 50 UNITS TWO TIMES DAILY. 30 mL 2  . levothyroxine (SYNTHROID, LEVOTHROID) 112 MCG tablet TAKE (1) TABLET BY MOUTH EACH MORNING. 30 tablet 2  . loratadine (CLARITIN) 10 MG tablet Take 10 mg by mouth at bedtime as needed for allergies.     [name] losartan (COZAAR) 100 MG tablet TAKE ONE TABLET BY MOUTH ONCE DAILY. 30 tablet 4  . metoprolol succinate (TOPROL-XL) 50 MG 24 hr tablet TAKE (1) TABLET BY MOUTH TWICE DAILY. 60 tablet 5  . mupirocin ointment (BACTROBAN) 2 % Apply 1 application topically 2 (two) times daily. 30 g 0  . NITROSTAT 0.4 MG SL tablet DISSOLVE 1 TABLET UNDER TONGUE EVERY 5 MINUTES UP TO 15 MIN FOR CHESTPAIN. IF NO RELIEF CALL 911. 25 tablet 3  . omeprazole (PRILOSEC) 20 MG capsule TAKE (1) CAPSULE BY MOUTH TWICE DAILY. (Patient taking differently: TAKE (1) CAPSULE BY MOUTH TWICE DAILY as needed) 60 capsule 5  . ONE TOUCH ULTRA TEST test strip 1 each by Other route. Four to five times daily.    . polyethylene glycol (MIRALAX / GLYCOLAX) packet Take 17 g by mouth 2 (two) times daily. (Patient taking differently: Take 17 g by mouth daily as needed. ) 14 each 0  . polyvinyl alcohol (LIQUIFILM TEARS) 1.4 % ophthalmic solution Place 1 drop into both eyes daily as needed for dry eyes.    . pravastatin (PRAVACHOL) 20 MG tablet TAKE ONE TABLET BY MOUTH ONCE DAILY. 30 tablet 4  . rOPINIRole (REQUIP) 4 MG tablet TAKE 1 TO 1 &  1/2 TABLETS BY MOUTH AT BEDTIME. 45 tablet 3  . sodium chloride (OCEAN) 0.65 % SOLN nasal spray  Place 1-2 sprays into both nostrils daily as needed for congestion.    . Testosterone Cypionate & Prop 200-20 MG/ML SOLN Inject 1 mL into the muscle every 14 (fourteen) days.    [name] triamterene-hydrochlorothiazide (MAXZIDE-25) 37.5-25 MG per tablet TAKE ONE TABLET BY MOUTH ONCE DAILY. 30 tablet 5  . VENTOLIN HFA 108 (90 BASE) MCG/ACT inhaler Inhale 1 puff into the lungs every 4 (four) hours as needed for wheezing or shortness of breath.     . Vitamin D, Ergocalciferol, (DRISDOL) 50000 UNITS CAPS capsule TAKE 1 CAPSULE BY MOUTH ONCE A WEEK. 12 capsule 0     Review of Systems  Constitutional: Negative.   Eyes: Negative.   Respiratory: Negative.   Cardiovascular: Negative.   Gastrointestinal: Positive for diarrhea.  Genitourinary: Negative.   Musculoskeletal: Positive for myalgias, back pain and joint pain.  Skin: Negative.   Neurological: Positive for headaches.  Endo/Heme/Allergies: Positive for environmental allergies.  Psychiatric/Behavioral: The patient has insomnia.       Physical Exam  Constitutional: He is oriented to person, place, and time. He appears well-developed and well-nourished.  HENT:  Head: Normocephalic and atraumatic.  Eyes: Pupils are equal, round, and reactive to light.  Neck: Neck supple. No JVD present. No tracheal deviation present. No thyromegaly present.  Respiratory: Effort normal and breath sounds normal. No stridor. No respiratory distress. He has no wheezes.  GI: Soft. There is no tenderness. There is no guarding.  Musculoskeletal:       Right knee: He exhibits decreased range of motion, swelling, deformity, laceration (healed previous incision), abnormal alignment and bony tenderness. He exhibits no erythema. Tenderness found. Patellar tendon tenderness noted.  Lymphadenopathy:    He has no cervical adenopathy.  Neurological: He is alert and oriented to  person, place, and time.  Skin: Skin is warm and dry.  Psychiatric: He has a normal mood and affect.       Assessment/Plan Assessment:    Right patella tendon rupture s/p TKA   Plan: Patient will undergo a right patella tendon reconstruction / repair on 12/04/2014 per Dr. Alvan Dame at [facility]. Risks benefits and expectations were discussed with the patient. Patient understand risks, benefits and expectations and wishes to proceed.    [place] Maham Quintin   PA-C  12/02/2014, 1:57 PM

## 2014-12-03 ENCOUNTER — Encounter (HOSPITAL_COMMUNITY)
Admission: RE | Admit: 2014-12-03 | Discharge: 2014-12-03 | Disposition: A | Discharge status: Home / Self Care | Payer: Medicare Other | Source: Ambulatory Visit | Attending: Orthopedic Surgery | Admitting: Orthopedic Surgery

## 2014-12-03 ENCOUNTER — Encounter (HOSPITAL_COMMUNITY): Payer: Self-pay

## 2014-12-03 DIAGNOSIS — M66261 Spontaneous rupture of extensor tendons, right lower leg: Secondary | ICD-10-CM | POA: Diagnosis not present

## 2014-12-03 HISTORY — DX: Pneumonia, unspecified organism: J18.9

## 2014-12-03 LAB — CBC
HEMATOCRIT: 45.5 % (ref 39.0–52.0)
HEMOGLOBIN: 14.5 g/dL (ref 13.0–17.0)
MCH: 28.9 pg (ref 26.0–34.0)
MCHC: 31.9 g/dL (ref 30.0–36.0)
MCV: 90.6 fL (ref 78.0–100.0)
Platelets: 233 10*3/uL (ref 150–400)
RBC: 5.02 MIL/uL (ref 4.22–5.81)
RDW: 13.9 % (ref 11.5–15.5)
WBC: 9.5 10*3/uL (ref 4.0–10.5)

## 2014-12-03 LAB — BASIC METABOLIC PANEL
ANION GAP: 8 (ref 5–15)
BUN: 22 mg/dL — ABNORMAL HIGH (ref 6–20)
CHLORIDE: 104 mmol/L (ref 101–111)
CO2: 26 mmol/L (ref 22–32)
Calcium: 10.4 mg/dL — ABNORMAL HIGH (ref 8.9–10.3)
Creatinine, Ser: 1.29 mg/dL — ABNORMAL HIGH (ref 0.61–1.24)
GFR calc Af Amer: >60 mL/min (ref >60)
GFR, EST NON AFRICAN AMERICAN: 57 mL/min — AB (ref >60)
GLUCOSE: 160 mg/dL — AB (ref 65–99)
POTASSIUM: 4.8 mmol/L (ref 3.5–5.1)
Sodium: 138 mmol/L (ref 135–145)

## 2014-12-03 LAB — URINALYSIS, ROUTINE W REFLEX MICROSCOPIC
Bilirubin Urine: NEGATIVE
HGB URINE DIPSTICK: NEGATIVE
KETONES UR: NEGATIVE mg/dL
LEUKOCYTES UA: NEGATIVE
Nitrite: NEGATIVE
PROTEIN: NEGATIVE mg/dL
Specific Gravity, Urine: 1.019 (ref 1.005–1.030)
pH: 6 (ref 5.0–8.0)

## 2014-12-03 LAB — PROTIME-INR
INR: 0.93 (ref 0.00–1.49)
Prothrombin Time: 12.7 seconds (ref 11.6–15.2)

## 2014-12-03 LAB — URINE MICROSCOPIC-ADD ON
Bacteria, UA: NONE SEEN
RBC / HPF: NONE SEEN RBC/hpf (ref 0–5)
Squamous Epithelial / LPF: NONE SEEN
Urine-Other: NONE SEEN
WBC, UA: NONE SEEN WBC/hpf (ref 0–5)

## 2014-12-03 LAB — TYPE AND SCREEN
ABO/RH(D): A POS
Antibody Screen: NEGATIVE

## 2014-12-03 LAB — ABO/RH: ABO/RH(D): A POS

## 2014-12-03 LAB — GLUCOSE, CAPILLARY: GLUCOSE-CAPILLARY: 215 mg/dL — AB (ref 65–99)

## 2014-12-03 LAB — APTT: APTT: 28 s (ref 24–37)

## 2014-12-03 MED ORDER — DEXAMETHASONE SODIUM PHOSPHATE 10 MG/ML IJ SOLN
10.0000 mg | Freq: Once | INTRAMUSCULAR | Status: DC
  Filled 2014-12-03: qty 1

## 2014-12-03 MED ORDER — CHLORHEXIDINE GLUCONATE 4 % EX LIQD: 60.0000 mL | Freq: Once | CUTANEOUS | Status: DC

## 2014-12-03 MED ORDER — CEFAZOLIN SODIUM-DEXTROSE 2-3 GM-% IV SOLR
2.0000 g | INTRAVENOUS | Status: AC
  Administered 2014-12-04: 2 g via INTRAVENOUS
  Filled 2014-12-03: qty 50

## 2014-12-03 MED ORDER — TRANEXAMIC ACID 1000 MG/10ML IV SOLN
2000.0000 mg | INTRAVENOUS | Status: DC
  Filled 2014-12-03: qty 20

## 2014-12-03 NOTE — Progress Notes (Addendum)
Anesthesia Chart Review: Patient is a 64 year old male scheduled for right knee patella tendon reconstruction/repair on 12/04/14 by Dr. Alvan Dame.  History includes former smoker, DM2, HLD, HTN, OSA with CPAP use (Dr. Brett Fairy), narcolepsy, CAD (non-dominant RCA), PE '04, RUE DVT '05, RLS, PE, asthma, hypothyroidism, PVD, skin cancer, left TKA '03, right TKA 03/23/14. He also reported a history of his core body temperature dropping after surgery. No complications with recent right TKA.  PCP is Dr. Wolfgang Phoenix. Endocrinologist is Dr. [name]. Cardiologist is Dr. Domenic Polite. Dr. Alvan Dame received signed clearance notes from Dr. [name] and Dr. Domenic Polite.   Meds include amlodipine, Klonopin, fenofibrate, Humalog, Dilaudid, Imdur, Levemir, levothyroxine, loratadine, losartan, metformin, Toprol, Nitro, Prilosec, pravastatin, Requip, Maxzide-25, Ventolin.   02/18/14 EKG: SR.  11/17/09 Nuclear stress test: IMPRESSION: Subtle area of Lexiscan induced partially reversible decreased myocardial perfusion involving the inferior wall left ventricle. Normal left ventricular ejection fraction of 56%. Normal left ventricular wall motion.  12/03/09 Cardiac catheterization: 90% proximal RCA stenosis in a small nondominant vessel, otherwise no significant flow limiting left-sided disease - only 20% proximal LAD noted. LVEF 55%. Mild inferior hypokinesis. Medical treatment recommended.  10/10/11 PFTs:  1. Spirometry shows minimal ventilatory defect without airflow obstruction. 2. There is no post BD improvement.  Preoperative labs noted. Cr 1.29. Glucose 160. CBC, PT/PTT WNL. A1C on 11/18/14 was 9.1 (down from 9.8). Pre-operative insulin instructions reviewed during PAT. His surgery is not scheduled until 2 PM, so he was instructed to come in earlier than instructed if needed.   If no acute changes and glucose is acceptable then I would anticipate that he could proceed as planned.  George [facility] Short Stay  Center/Anesthesiology Phone [phone] 12/03/2014 12:32 PM

## 2014-12-03 NOTE — Pre-Procedure Instructions (Addendum)
Paul Baker  12/03/2014      [facility] [address], [place] [address] Phone: [phone] Fax: [phone]  [place], [place] [id number] [place] [id number] Phone: [phone] Fax: [phone]    Your procedure is scheduled on Dec 2  Report to [facility] Admitting at 1200 noon  Call this number if you have problems the morning of surgery:  [phone]   Remember:  Do not eat food or drink liquids after midnight.  Take these medicines the morning of surgery with A SIP OF WATER amlodipine (Norvasc), hydromorphone (Dilaudid) if needed, isosorbide mononitrate (Imdur), levothyroxine (Synthroid),  Metoprolol succinate(Toprol-XL),  Nitrostat if needed, Omeprazole (Prilosec), Ventolin inhaler-bring with you on the day of surgery, use if needed   How to Manage Your Diabetes Before Surgery   Why is it important to control my blood sugar before and after surgery?   Improving blood sugar levels before and after surgery helps healing and can limit problems.  A way of improving blood sugar control is eating a healthy diet by:  - Eating less sugar and carbohydrates  - Increasing activity/exercise  - Talk with your doctor about reaching your blood sugar goals  High blood sugars (greater than 180 mg/dL) can raise your risk of infections and slow down your recovery so you will need to focus on controlling your diabetes during the weeks before surgery.  Make sure that the doctor who takes care of your diabetes knows about your planned surgery including the date and location.  How do I manage my blood sugars before surgery?   Check your blood sugar at least 4 times a day, 2 days before surgery to make sure that they are not too high or low.   Check your blood sugar the morning of your surgery when you wake up and every 2               hours until you  get to the Short-Stay unit.  If your blood sugar is less than 70 mg/dL, you will need to treat for low blood sugar by:  Treat a low blood sugar (less than 70 mg/dL) with 1/2 cup of clear juice (cranberry or apple), 4 glucose tablets, OR glucose gel.  Recheck blood sugar in 15 minutes after treatment (to make sure it is greater than 70 mg/dL).  If blood sugar is not greater than 70 mg/dL on re-check, call [phone] for further instructions.   Report your blood sugar to the Short-Stay nurse when you get to Short-Stay.  References:  [facility], 2007 "How to Manage your Diabetes Before and After Surgery".  What do I do about my diabetes medications?   Do not take oral diabetes medicines (pills) the morning of surgery.  THE NIGHT BEFORE SURGERY, take 80 units of Levemir Insulin.      Do not take other diabetes injectables the day of surgery including Byetta, Victoza, Bydureon, and Trulicity.    If your CBG is greater than 220 mg/dL, you may take 1/2 of your sliding scale (correction) dose of insulin.   For patients with "Insulin Pumps":  Contact your diabetes doctor for specific instructions before surgery.   Decrease basal insulin rates by 20% at midnight the night before surgery.  Note that if your surgery is planned to be longer than 2 hours, your insulin pump will be removed and  intravenous (IV) insulin will be started and managed by the nurses and anesthesiologist.  You will be able to restart your insulin pump once you are awake and able to manage it.  Make sure to bring insulin pump supplies to the hospital with you in case your site needs to be changed.       Stop taking aspirin, Ibuprofen, BC's, Goody's, Herbal medications, Fish Oil, vitamins, aleve   Do not wear jewelry, make-up or nail polish.  Do not wear lotions, powders, or perfumes.  You may wear deodorant.  Do not shave 48 hours prior to surgery.  Men may shave face and  neck.  Do not bring valuables to the hospital.  [facility] is not responsible for any belongings or valuables.  Contacts, dentures or bridgework may not be worn into surgery.  Leave your suitcase in the car.  After surgery it may be brought to your room.  For patients admitted to the hospital, discharge time will be determined by your treatment team.  Patients discharged the day of surgery will not be allowed to drive home.    Special instructions:  [place] - Preparing for Surgery  Before surgery, you can play an important role.  Because skin is not sterile, your skin needs to be as free of germs as possible.  You can reduce the number of germs on you skin by washing with CHG (chlorahexidine gluconate) soap before surgery.  CHG is an antiseptic cleaner which kills germs and bonds with the skin to continue killing germs even after washing.  Please DO NOT use if you have an allergy to CHG or antibacterial soaps.  If your skin becomes reddened/irritated stop using the CHG and inform your nurse when you arrive at Short Stay.  Do not shave (including legs and underarms) for at least 48 hours prior to the first CHG shower.  You may shave your face.  Please follow these instructions carefully:   1.  Shower with CHG Soap the night before surgery and the    morning of Surgery.  2.  If you choose to wash your hair, wash your hair first as usual with your  normal shampoo.  3.  After you shampoo, rinse your hair and body thoroughly to remove the  Shampoo.  4.  Use CHG as you would any other liquid soap.  You can apply chg directly   to the skin and wash gently with scrungie or a clean washcloth.  5.  Apply the CHG Soap to your body ONLY FROM THE NECK DOWN.      Do not use on open wounds or open sores.  Avoid contact with your eyes,   ears, mouth and genitals (private parts).  Wash genitals (private parts)   with your normal soap.  6.  Wash thoroughly, paying special attention to the area where  your surgery  will be performed.  7.  Thoroughly rinse your body with warm water from the neck down.  8.  DO NOT shower/wash with your normal soap after using and rinsing off the CHG Soap.  9.  Pat yourself dry with a clean towel.            10.  Wear clean pajamas.            11.  Place clean sheets on your bed the night of your first shower and do not  sleep with pets.  Day of Surgery  Do not apply any lotions/deoderants the morning of surgery.  Please wear clean clothes to the hospital/surgery center.     Please read over the following fact sheets that you were given. Pain Booklet, Coughing and Deep Breathing, Blood Transfusion Information and Surgical Site Infection Prevention

## 2014-12-03 NOTE — Progress Notes (Signed)
Message left for [facility] at Dr Aurea Graff office to cal or fax over any clearance notes she may have.

## 2014-12-03 NOTE — Progress Notes (Addendum)
PCP is Dr [name] Cardiologist is Dr Domenic Polite- last saw him 08-19-14 Dr [name] manages his Dm last saw him 11-25-14 HA1c was 9.1 Pt reports his fasting cbg's run 69-200 cbg today was 215- reports he just ate at 0830 this am Pt reports multi blood clots in his lungs, legs and one clot in his right shoulder from a  picc line Informed pt to bring in his cpap mask tomorrow. Reports he had a sleep study in 2015 at [facility] Neuro sleep clinic Pt reports he had a spinal in March with his last knee surgery and everything went well  Pt reports he had a decrease in his core temp in 1991, but no problems since

## 2014-12-04 ENCOUNTER — Inpatient Hospital Stay (HOSPITAL_COMMUNITY)
Admission: RE | Admit: 2014-12-04 | Discharge: 2014-12-05 | DRG: 502 | Disposition: A | Discharge status: Home Health | Payer: Medicare Other | Source: Ambulatory Visit | Attending: Orthopedic Surgery | Admitting: Orthopedic Surgery

## 2014-12-04 ENCOUNTER — Encounter (HOSPITAL_COMMUNITY)
Admission: RE | Disposition: A | Discharge status: Home Health | Payer: Self-pay | Source: Ambulatory Visit | Attending: Orthopedic Surgery

## 2014-12-04 ENCOUNTER — Encounter (HOSPITAL_COMMUNITY): Payer: Self-pay | Admitting: General Practice

## 2014-12-04 ENCOUNTER — Ambulatory Visit (HOSPITAL_COMMUNITY): Payer: Medicare Other | Admitting: Anesthesiology

## 2014-12-04 ENCOUNTER — Ambulatory Visit (HOSPITAL_COMMUNITY): Payer: Medicare Other | Admitting: Vascular Surgery

## 2014-12-04 DIAGNOSIS — I10 Essential (primary) hypertension: Secondary | ICD-10-CM | POA: Diagnosis not present

## 2014-12-04 DIAGNOSIS — Z86711 Personal history of pulmonary embolism: Secondary | ICD-10-CM

## 2014-12-04 DIAGNOSIS — E782 Mixed hyperlipidemia: Secondary | ICD-10-CM | POA: Diagnosis present

## 2014-12-04 DIAGNOSIS — G4733 Obstructive sleep apnea (adult) (pediatric): Secondary | ICD-10-CM | POA: Diagnosis present

## 2014-12-04 DIAGNOSIS — S86819A Strain of other muscle(s) and tendon(s) at lower leg level, unspecified leg, initial encounter: Secondary | ICD-10-CM | POA: Diagnosis present

## 2014-12-04 DIAGNOSIS — E119 Type 2 diabetes mellitus without complications: Secondary | ICD-10-CM | POA: Diagnosis not present

## 2014-12-04 DIAGNOSIS — J45909 Unspecified asthma, uncomplicated: Secondary | ICD-10-CM | POA: Diagnosis present

## 2014-12-04 DIAGNOSIS — S76111A Strain of right quadriceps muscle, fascia and tendon, initial encounter: Secondary | ICD-10-CM | POA: Diagnosis not present

## 2014-12-04 DIAGNOSIS — Z96651 Presence of right artificial knee joint: Secondary | ICD-10-CM | POA: Diagnosis present

## 2014-12-04 DIAGNOSIS — E039 Hypothyroidism, unspecified: Secondary | ICD-10-CM | POA: Diagnosis not present

## 2014-12-04 DIAGNOSIS — Z85828 Personal history of other malignant neoplasm of skin: Secondary | ICD-10-CM

## 2014-12-04 DIAGNOSIS — G2581 Restless legs syndrome: Secondary | ICD-10-CM | POA: Diagnosis present

## 2014-12-04 DIAGNOSIS — G47419 Narcolepsy without cataplexy: Secondary | ICD-10-CM | POA: Diagnosis present

## 2014-12-04 DIAGNOSIS — Z794 Long term (current) use of insulin: Secondary | ICD-10-CM

## 2014-12-04 DIAGNOSIS — Z91048 Other nonmedicinal substance allergy status: Secondary | ICD-10-CM

## 2014-12-04 DIAGNOSIS — Z86010 Personal history of colonic polyps: Secondary | ICD-10-CM | POA: Diagnosis not present

## 2014-12-04 DIAGNOSIS — I2510 Atherosclerotic heart disease of native coronary artery without angina pectoris: Secondary | ICD-10-CM | POA: Diagnosis present

## 2014-12-04 DIAGNOSIS — Z888 Allergy status to other drugs, medicaments and biological substances status: Secondary | ICD-10-CM | POA: Diagnosis not present

## 2014-12-04 DIAGNOSIS — Z87891 Personal history of nicotine dependence: Secondary | ICD-10-CM | POA: Diagnosis not present

## 2014-12-04 DIAGNOSIS — G8918 Other acute postprocedural pain: Secondary | ICD-10-CM | POA: Diagnosis not present

## 2014-12-04 DIAGNOSIS — M66261 Spontaneous rupture of extensor tendons, right lower leg: Secondary | ICD-10-CM | POA: Diagnosis not present

## 2014-12-04 HISTORY — PX: KNEE ARTHROTOMY: SHX5881

## 2014-12-04 LAB — GLUCOSE, CAPILLARY
GLUCOSE-CAPILLARY: 248 mg/dL — AB (ref 65–99)
GLUCOSE-CAPILLARY: 174 mg/dL — AB (ref 65–99)
GLUCOSE-CAPILLARY: 301 mg/dL — AB (ref 65–99)
Glucose-Capillary: 216 mg/dL — ABNORMAL HIGH (ref 65–99)
Glucose-Capillary: 202 mg/dL — ABNORMAL HIGH (ref 65–99)
Glucose-Capillary: 191 mg/dL — ABNORMAL HIGH (ref 65–99)

## 2014-12-04 SURGERY — ARTHROTOMY, KNEE
Anesthesia: Monitor Anesthesia Care | Laterality: Right

## 2014-12-04 MED ORDER — DIPHENHYDRAMINE HCL 25 MG PO CAPS: 25.0000 mg | ORAL_CAPSULE | Freq: Four times a day (QID) | ORAL | Status: DC | PRN

## 2014-12-04 MED ORDER — ONDANSETRON HCL 4 MG/2ML IJ SOLN: 4.0000 mg | Freq: Once | INTRAMUSCULAR | Status: DC | PRN

## 2014-12-04 MED ORDER — MENTHOL 3 MG MT LOZG: 1.0000 | LOZENGE | OROMUCOSAL | Status: DC | PRN

## 2014-12-04 MED ORDER — INSULIN ASPART 100 UNIT/ML ~~LOC~~ SOLN
0.0000 [IU] | Freq: Three times a day (TID) | SUBCUTANEOUS | Status: DC
  Administered 2014-12-05: 3 [IU] via SUBCUTANEOUS

## 2014-12-04 MED ORDER — CEFAZOLIN SODIUM-DEXTROSE 2-3 GM-% IV SOLR
2.0000 g | Freq: Four times a day (QID) | INTRAVENOUS | Status: AC
  Administered 2014-12-04 – 2014-12-05 (×2): 2 g via INTRAVENOUS
  Filled 2014-12-04 (×3): qty 50

## 2014-12-04 MED ORDER — LOSARTAN POTASSIUM 50 MG PO TABS
50.0000 mg | ORAL_TABLET | Freq: Every day | ORAL | Status: DC
  Administered 2014-12-04: 50 mg via ORAL
  Filled 2014-12-04: qty 1

## 2014-12-04 MED ORDER — ONDANSETRON HCL 4 MG/2ML IJ SOLN: 4.0000 mg | Freq: Four times a day (QID) | INTRAMUSCULAR | Status: DC | PRN

## 2014-12-04 MED ORDER — DOCUSATE SODIUM 100 MG PO CAPS
100.0000 mg | ORAL_CAPSULE | Freq: Two times a day (BID) | ORAL | Status: DC
  Administered 2014-12-04 – 2014-12-05 (×2): 100 mg via ORAL
  Filled 2014-12-04 (×2): qty 1

## 2014-12-04 MED ORDER — HYDROMORPHONE HCL 2 MG PO TABS: 2.0000 mg | ORAL_TABLET | ORAL | Status: DC | PRN

## 2014-12-04 MED ORDER — ASPIRIN EC 325 MG PO TBEC
325.0000 mg | DELAYED_RELEASE_TABLET | Freq: Two times a day (BID) | ORAL | Status: DC
  Administered 2014-12-05: 325 mg via ORAL
  Filled 2014-12-04: qty 1

## 2014-12-04 MED ORDER — DEXAMETHASONE SODIUM PHOSPHATE 10 MG/ML IJ SOLN: 10.0000 mg | Freq: Once | INTRAMUSCULAR | Status: DC

## 2014-12-04 MED ORDER — BUPIVACAINE-EPINEPHRINE 0.25% -1:200000 IJ SOLN
INTRAMUSCULAR | Status: DC | PRN
  Administered 2014-12-04: 10 mL

## 2014-12-04 MED ORDER — FENTANYL CITRATE (PF) 100 MCG/2ML IJ SOLN
INTRAMUSCULAR | Status: DC | PRN
Start: 2014-12-04 — End: 2014-12-04
  Administered 2014-12-04: 50 ug via INTRAVENOUS

## 2014-12-04 MED ORDER — ROPINIROLE HCL 1 MG PO TABS: 4.0000 mg | ORAL_TABLET | Freq: Every day | ORAL | Status: DC

## 2014-12-04 MED ORDER — PHENOL 1.4 % MT LIQD: 1.0000 | OROMUCOSAL | Status: DC | PRN

## 2014-12-04 MED ORDER — METHOCARBAMOL 1000 MG/10ML IJ SOLN
500.0000 mg | Freq: Four times a day (QID) | INTRAVENOUS | Status: DC | PRN
  Filled 2014-12-04: qty 5

## 2014-12-04 MED ORDER — FENTANYL CITRATE (PF) 100 MCG/2ML IJ SOLN
50.0000 ug | Freq: Once | INTRAMUSCULAR | Status: AC
  Administered 2014-12-04: 50 ug via INTRAVENOUS

## 2014-12-04 MED ORDER — METOPROLOL SUCCINATE ER 50 MG PO TB24
50.0000 mg | ORAL_TABLET | Freq: Two times a day (BID) | ORAL | Status: DC
  Administered 2014-12-04 – 2014-12-05 (×2): 50 mg via ORAL
  Filled 2014-12-04 (×2): qty 1

## 2014-12-04 MED ORDER — MAGNESIUM CITRATE PO SOLN: 1.0000 | Freq: Once | ORAL | Status: DC | PRN

## 2014-12-04 MED ORDER — CLOBETASOL PROPIONATE 0.05 % EX CREA
1.0000 "application " | TOPICAL_CREAM | Freq: Every day | CUTANEOUS | Status: DC | PRN
  Filled 2014-12-04: qty 15

## 2014-12-04 MED ORDER — LEVOTHYROXINE SODIUM 112 MCG PO TABS
112.0000 ug | ORAL_TABLET | Freq: Every day | ORAL | Status: DC
  Administered 2014-12-05: 112 ug via ORAL
  Filled 2014-12-04: qty 1

## 2014-12-04 MED ORDER — FENTANYL CITRATE (PF) 100 MCG/2ML IJ SOLN
INTRAMUSCULAR | Status: AC
  Filled 2014-12-04: qty 2

## 2014-12-04 MED ORDER — BISACODYL 10 MG RE SUPP: 10.0000 mg | Freq: Every day | RECTAL | Status: DC | PRN

## 2014-12-04 MED ORDER — MIDAZOLAM HCL 2 MG/2ML IJ SOLN
INTRAMUSCULAR | Status: AC
  Filled 2014-12-04: qty 2

## 2014-12-04 MED ORDER — BUPIVACAINE-EPINEPHRINE (PF) 0.5% -1:200000 IJ SOLN
INTRAMUSCULAR | Status: DC | PRN
  Administered 2014-12-04: 25 mL via PERINEURAL

## 2014-12-04 MED ORDER — POLYETHYLENE GLYCOL 3350 17 G PO PACK
17.0000 g | PACK | Freq: Two times a day (BID) | ORAL | Status: DC
  Filled 2014-12-04 (×2): qty 1

## 2014-12-04 MED ORDER — ARMODAFINIL 200 MG PO TABS: 1.0000 | ORAL_TABLET | Freq: Every day | ORAL | Status: DC

## 2014-12-04 MED ORDER — ONDANSETRON HCL 4 MG PO TABS: 4.0000 mg | ORAL_TABLET | Freq: Four times a day (QID) | ORAL | Status: DC | PRN

## 2014-12-04 MED ORDER — AMLODIPINE BESYLATE 5 MG PO TABS
5.0000 mg | ORAL_TABLET | Freq: Every day | ORAL | Status: DC
  Administered 2014-12-05: 5 mg via ORAL
  Filled 2014-12-04: qty 1

## 2014-12-04 MED ORDER — FENOFIBRATE 160 MG PO TABS
160.0000 mg | ORAL_TABLET | Freq: Every day | ORAL | Status: DC
  Administered 2014-12-04: 160 mg via ORAL
  Filled 2014-12-04: qty 1

## 2014-12-04 MED ORDER — ALUM & MAG HYDROXIDE-SIMETH 200-200-20 MG/5ML PO SUSP: 30.0000 mL | ORAL | Status: DC | PRN

## 2014-12-04 MED ORDER — BUPIVACAINE IN DEXTROSE 0.75-8.25 % IT SOLN
INTRATHECAL | Status: DC | PRN
  Administered 2014-12-04: 1.8 mL via INTRATHECAL

## 2014-12-04 MED ORDER — LORATADINE 10 MG PO TABS: 10.0000 mg | ORAL_TABLET | Freq: Every evening | ORAL | Status: DC | PRN

## 2014-12-04 MED ORDER — PROPOFOL 10 MG/ML IV BOLUS
INTRAVENOUS | Status: AC
Start: 2014-12-04 — End: 2014-12-04
  Filled 2014-12-04: qty 20

## 2014-12-04 MED ORDER — NITROGLYCERIN 0.4 MG SL SUBL: 0.4000 mg | SUBLINGUAL_TABLET | SUBLINGUAL | Status: DC | PRN

## 2014-12-04 MED ORDER — BUPIVACAINE-EPINEPHRINE (PF) 0.25% -1:200000 IJ SOLN
INTRAMUSCULAR | Status: AC
  Filled 2014-12-04: qty 30

## 2014-12-04 MED ORDER — CELECOXIB 200 MG PO CAPS
200.0000 mg | ORAL_CAPSULE | Freq: Two times a day (BID) | ORAL | Status: DC
  Administered 2014-12-04: 200 mg via ORAL
  Filled 2014-12-04 (×2): qty 1

## 2014-12-04 MED ORDER — MIDAZOLAM HCL 5 MG/5ML IJ SOLN
INTRAMUSCULAR | Status: DC | PRN
  Administered 2014-12-04 (×2): 1 mg via INTRAVENOUS

## 2014-12-04 MED ORDER — HYDROMORPHONE HCL 1 MG/ML IJ SOLN: 0.5000 mg | INTRAMUSCULAR | Status: DC | PRN

## 2014-12-04 MED ORDER — TRIAMTERENE-HCTZ 37.5-25 MG PO TABS
1.0000 | ORAL_TABLET | Freq: Every day | ORAL | Status: DC
  Administered 2014-12-04 – 2014-12-05 (×2): 1 via ORAL
  Filled 2014-12-04 (×3): qty 1

## 2014-12-04 MED ORDER — SODIUM CHLORIDE 0.9 % IV SOLN
INTRAVENOUS | Status: DC
  Administered 2014-12-05: 05:00:00 via INTRAVENOUS
  Filled 2014-12-04 (×4): qty 1000

## 2014-12-04 MED ORDER — ALBUTEROL SULFATE (2.5 MG/3ML) 0.083% IN NEBU: 2.5000 mg | INHALATION_SOLUTION | RESPIRATORY_TRACT | Status: DC | PRN

## 2014-12-04 MED ORDER — METFORMIN HCL 500 MG PO TABS
500.0000 mg | ORAL_TABLET | Freq: Two times a day (BID) | ORAL | Status: DC
  Administered 2014-12-04 – 2014-12-05 (×2): 500 mg via ORAL
  Filled 2014-12-04 (×2): qty 1

## 2014-12-04 MED ORDER — FENTANYL CITRATE (PF) 250 MCG/5ML IJ SOLN
INTRAMUSCULAR | Status: AC
  Filled 2014-12-04: qty 5

## 2014-12-04 MED ORDER — LACTATED RINGERS IV SOLN
INTRAVENOUS | Status: DC
  Administered 2014-12-04 (×3): via INTRAVENOUS

## 2014-12-04 MED ORDER — PANTOPRAZOLE SODIUM 40 MG PO TBEC
40.0000 mg | DELAYED_RELEASE_TABLET | Freq: Two times a day (BID) | ORAL | Status: DC
  Administered 2014-12-04 – 2014-12-05 (×2): 40 mg via ORAL
  Filled 2014-12-04 (×2): qty 1

## 2014-12-04 MED ORDER — ASPIRIN EC 325 MG PO TBEC: 325.0000 mg | DELAYED_RELEASE_TABLET | Freq: Two times a day (BID) | ORAL | Status: AC

## 2014-12-04 MED ORDER — MIDAZOLAM HCL 2 MG/2ML IJ SOLN
2.0000 mg | Freq: Once | INTRAMUSCULAR | Status: AC
  Administered 2014-12-04: 2 mg via INTRAVENOUS

## 2014-12-04 MED ORDER — METHOCARBAMOL 500 MG PO TABS
ORAL_TABLET | ORAL | Status: AC
Start: 2014-12-04 — End: 2014-12-05
  Filled 2014-12-04: qty 1

## 2014-12-04 MED ORDER — ROPINIROLE HCL 1 MG PO TABS
4.0000 mg | ORAL_TABLET | Freq: Every day | ORAL | Status: DC
  Administered 2014-12-04: 4 mg via ORAL
  Filled 2014-12-04: qty 4

## 2014-12-04 MED ORDER — METHOCARBAMOL 500 MG PO TABS
500.0000 mg | ORAL_TABLET | Freq: Four times a day (QID) | ORAL | Status: DC | PRN
  Administered 2014-12-04 – 2014-12-05 (×2): 500 mg via ORAL
  Filled 2014-12-04: qty 1

## 2014-12-04 MED ORDER — PROPOFOL 10 MG/ML IV BOLUS
INTRAVENOUS | Status: DC | PRN
  Administered 2014-12-04: 200 mg via INTRAVENOUS

## 2014-12-04 MED ORDER — ACETAMINOPHEN 500 MG PO TABS
1000.0000 mg | ORAL_TABLET | Freq: Three times a day (TID) | ORAL | Status: DC
  Administered 2014-12-04 – 2014-12-05 (×2): 1000 mg via ORAL
  Filled 2014-12-04 (×2): qty 2

## 2014-12-04 MED ORDER — FERROUS SULFATE 325 (65 FE) MG PO TABS
325.0000 mg | ORAL_TABLET | Freq: Three times a day (TID) | ORAL | Status: DC
  Administered 2014-12-04 – 2014-12-05 (×2): 325 mg via ORAL
  Filled 2014-12-04 (×2): qty 1

## 2014-12-04 MED ORDER — METOCLOPRAMIDE HCL 5 MG PO TABS: 5.0000 mg | ORAL_TABLET | Freq: Three times a day (TID) | ORAL | Status: DC | PRN

## 2014-12-04 MED ORDER — METHOCARBAMOL 500 MG PO TABS: 500.0000 mg | ORAL_TABLET | Freq: Four times a day (QID) | ORAL | Status: DC | PRN

## 2014-12-04 MED ORDER — HYDROMORPHONE HCL 2 MG PO TABS
2.0000 mg | ORAL_TABLET | ORAL | Status: DC
  Administered 2014-12-04 (×2): 2 mg via ORAL
  Administered 2014-12-05 (×3): 4 mg via ORAL
  Filled 2014-12-04 (×2): qty 2
  Filled 2014-12-04: qty 1
  Filled 2014-12-04: qty 2

## 2014-12-04 MED ORDER — CLONAZEPAM 1 MG PO TABS: 1.0000 mg | ORAL_TABLET | Freq: Two times a day (BID) | ORAL | Status: DC | PRN

## 2014-12-04 MED ORDER — SODIUM CHLORIDE 0.9 % IR SOLN
Status: DC | PRN
  Administered 2014-12-04: 1000 mL

## 2014-12-04 MED ORDER — PROPOFOL 500 MG/50ML IV EMUL
INTRAVENOUS | Status: DC | PRN
  Administered 2014-12-04: 80 ug/kg/min via INTRAVENOUS

## 2014-12-04 MED ORDER — PRAVASTATIN SODIUM 20 MG PO TABS
10.0000 mg | ORAL_TABLET | Freq: Every day | ORAL | Status: DC
  Administered 2014-12-04: 10 mg via ORAL
  Filled 2014-12-04 (×2): qty 1

## 2014-12-04 MED ORDER — FENTANYL CITRATE (PF) 100 MCG/2ML IJ SOLN
25.0000 ug | INTRAMUSCULAR | Status: DC | PRN
  Administered 2014-12-04 (×2): 50 ug via INTRAVENOUS

## 2014-12-04 MED ORDER — INSULIN DETEMIR 100 UNIT/ML ~~LOC~~ SOLN
100.0000 [IU] | Freq: Every day | SUBCUTANEOUS | Status: DC
  Administered 2014-12-04: 100 [IU] via SUBCUTANEOUS
  Filled 2014-12-04 (×2): qty 1

## 2014-12-04 MED ORDER — METOCLOPRAMIDE HCL 5 MG/ML IJ SOLN: 5.0000 mg | Freq: Three times a day (TID) | INTRAMUSCULAR | Status: DC | PRN

## 2014-12-04 MED ORDER — HYDROMORPHONE HCL 2 MG PO TABS
ORAL_TABLET | ORAL | Status: AC
  Filled 2014-12-04: qty 1

## 2014-12-04 MED ORDER — ISOSORBIDE MONONITRATE ER 30 MG PO TB24
30.0000 mg | ORAL_TABLET | Freq: Two times a day (BID) | ORAL | Status: DC
  Administered 2014-12-04 – 2014-12-05 (×2): 30 mg via ORAL
  Filled 2014-12-04 (×2): qty 1

## 2014-12-04 SURGICAL SUPPLY — 71 items
ADH SKN CLS APL DERMABOND .7 (GAUZE/BANDAGES/DRESSINGS) ×1
BAG DECANTER FOR FLEXI CONT (MISCELLANEOUS) ×1 IMPLANT
BANDAGE ELASTIC 6 VELCRO ST LF (GAUZE/BANDAGES/DRESSINGS) ×3 IMPLANT
BANDAGE ESMARK 6X9 LF (GAUZE/BANDAGES/DRESSINGS) ×1 IMPLANT
BLADE CUDA 5.5 (BLADE) IMPLANT
BLADE GREAT WHITE 4.2 (BLADE) ×1 IMPLANT
BLADE SURG ROTATE 9660 (MISCELLANEOUS) IMPLANT
BNDG CMPR 9X6 STRL LF SNTH (GAUZE/BANDAGES/DRESSINGS) ×1
BNDG COHESIVE 6X5 TAN STRL LF (GAUZE/BANDAGES/DRESSINGS) ×1 IMPLANT
BNDG ESMARK 6X9 LF (GAUZE/BANDAGES/DRESSINGS) ×2
BOOTCOVER CLEANROOM LRG (PROTECTIVE WEAR) ×2 IMPLANT
BUR OVAL 6.0 (BURR) IMPLANT
COVER MAYO STAND STRL (DRAPES) ×1 IMPLANT
CUFF TOURNIQUET SINGLE 34IN LL (TOURNIQUET CUFF) ×1 IMPLANT
CUFF TOURNIQUET SINGLE 44IN (TOURNIQUET CUFF) IMPLANT
DERMABOND ADVANCED (GAUZE/BANDAGES/DRESSINGS) ×1
DERMABOND ADVANCED .7 DNX12 (GAUZE/BANDAGES/DRESSINGS) IMPLANT
DRAPE ARTHROSCOPY W/POUCH 114 (DRAPES) ×1 IMPLANT
DRAPE INCISE IOBAN 66X45 STRL (DRAPES) ×1 IMPLANT
DRAPE U-SHAPE 47X51 STRL (DRAPES) ×2 IMPLANT
DRILL TWIST (DRILL) ×1 IMPLANT
DRSG EMULSION OIL 3X3 NADH (GAUZE/BANDAGES/DRESSINGS) ×2 IMPLANT
DRSG MEPILEX BORDER 4X12 (GAUZE/BANDAGES/DRESSINGS) ×1 IMPLANT
DRSG PAD ABDOMINAL 8X10 ST (GAUZE/BANDAGES/DRESSINGS) ×5 IMPLANT
DURAPREP 26ML APPLICATOR (WOUND CARE) ×2 IMPLANT
FACESHIELD STD STERILE (MASK) ×2 IMPLANT
GAUZE SPONGE 4X4 12PLY STRL (GAUZE/BANDAGES/DRESSINGS) ×2 IMPLANT
GAUZE XEROFORM 1X8 LF (GAUZE/BANDAGES/DRESSINGS) ×2 IMPLANT
GLOVE BIOGEL PI IND STRL 7.5 (GLOVE) ×1 IMPLANT
GLOVE BIOGEL PI IND STRL 8 (GLOVE) ×1 IMPLANT
GLOVE BIOGEL PI INDICATOR 7.5 (GLOVE) ×2
GLOVE BIOGEL PI INDICATOR 8 (GLOVE) ×2
GLOVE ORTHO TXT STRL SZ7.5 (GLOVE) ×2 IMPLANT
GLOVE SURG ORTHO 8.0 STRL STRW (GLOVE) ×3 IMPLANT
GOWN STRL REUS W/ TWL LRG LVL3 (GOWN DISPOSABLE) ×4 IMPLANT
GOWN STRL REUS W/TWL LRG LVL3 (GOWN DISPOSABLE) ×8
IMMOBILIZER KNEE 24 THIGH 36 (MISCELLANEOUS) IMPLANT
IMMOBILIZER KNEE 24 UNIV (MISCELLANEOUS) ×2
KIT BASIN OR (CUSTOM PROCEDURE TRAY) ×2 IMPLANT
KIT ROOM TURNOVER OR (KITS) ×2 IMPLANT
MANIFOLD NEPTUNE II (INSTRUMENTS) ×2 IMPLANT
NDL HYPO 21X1.5 SAFETY (NEEDLE) ×1 IMPLANT
NEEDLE HYPO 21X1.5 SAFETY (NEEDLE) ×2 IMPLANT
PACK ORTHO EXTREMITY (CUSTOM PROCEDURE TRAY) ×1 IMPLANT
PAD ABD 8X10 STRL (GAUZE/BANDAGES/DRESSINGS) ×1 IMPLANT
PAD ARMBOARD 7.5X6 YLW CONV (MISCELLANEOUS) ×4 IMPLANT
PADDING CAST COTTON 6X4 STRL (CAST SUPPLIES) ×2 IMPLANT
PASSER SUT SWANSON 36MM LOOP (INSTRUMENTS) ×1 IMPLANT
SET ARTHROSCOPY TUBING (MISCELLANEOUS)
SET ARTHROSCOPY TUBING LN (MISCELLANEOUS) ×1 IMPLANT
SPONGE LAP 18X18 X RAY DECT (DISPOSABLE) ×1 IMPLANT
SPONGE LAP 4X18 X RAY DECT (DISPOSABLE) ×2 IMPLANT
STOCKINETTE IMPERVIOUS LG (DRAPES) ×1 IMPLANT
SUCTION FRAZIER TIP 10 FR DISP (SUCTIONS) ×2 IMPLANT
SUT ETHIBOND NAB CT1 #1 30IN (SUTURE) ×1 IMPLANT
SUT ETHILON 3 0 PS 1 (SUTURE) ×2 IMPLANT
SUT FIBERWIRE #2 38 T-5 BLUE (SUTURE) ×8
SUT MNCRL AB 3-0 PS2 18 (SUTURE) ×1 IMPLANT
SUT VIC AB 1 CT1 27 (SUTURE) ×2
SUT VIC AB 1 CT1 27XBRD ANBCTR (SUTURE) IMPLANT
SUT VIC AB 2-0 CT1 27 (SUTURE) ×4
SUT VIC AB 2-0 CT1 36 (SUTURE) ×1 IMPLANT
SUT VIC AB 2-0 CT1 TAPERPNT 27 (SUTURE) IMPLANT
SUTURE FIBERWR #2 38 T-5 BLUE (SUTURE) IMPLANT
SYR CONTROL 10ML LL (SYRINGE) ×2 IMPLANT
TOWEL OR 17X24 6PK STRL BLUE (TOWEL DISPOSABLE) ×2 IMPLANT
TOWEL OR 17X26 10 PK STRL BLUE (TOWEL DISPOSABLE) ×3 IMPLANT
TUBE CONNECTING 12X1/4 (SUCTIONS) ×3 IMPLANT
WAND HAND CNTRL MULTIVAC 90 (MISCELLANEOUS) IMPLANT
WATER STERILE IRR 1000ML POUR (IV SOLUTION) ×2 IMPLANT
YANKAUER SUCT BULB TIP NO VENT (SUCTIONS) ×1 IMPLANT

## 2014-12-04 NOTE — Transfer of Care (Signed)
Immediate Anesthesia Transfer of Care Note  Patient: Paul Baker  Procedure(s) Performed: Procedure(s): KNEE ARTHROTOMY PATELLA LIGAMENT RECONSTRUSION AND REPAIR RIGHT KNEE (Right)  Patient Location: PACU  Anesthesia Type:MAC, General, Regional and Spinal  Level of Consciousness: awake, alert  and oriented  Airway & Oxygen Therapy: Patient Spontanous Breathing and Patient connected to face mask oxygen  Post-op Assessment: Report given to RN and Post -op Vital signs reviewed and stable  Post vital signs: Reviewed and stable  Last Vitals:  Filed Vitals:   12/04/14 1350 12/04/14 1656  BP: 130/65 105/51  Pulse: 76 73  Temp:  36.4 C  Resp: 15 11    Complications: No apparent anesthesia complications

## 2014-12-04 NOTE — Interval H&P Note (Signed)
History and Physical Interval Note:  12/04/2014 2:43 PM  Paul Baker  has presented today for surgery, with the diagnosis of PATELLA LIGAMNET RUPTURE,STATUS POST TKA RIGHT KNEE  The various methods of treatment have been discussed with the patient and family. After consideration of risks, benefits and other options for treatment, the patient has consented to  Procedure(s): [place] (Right) as a surgical intervention .  The patient's history has been reviewed, patient examined, no change in status, stable for surgery.  I have reviewed the patient's chart and labs.  Questions were answered to the patient's satisfaction.     Mauri Pole

## 2014-12-04 NOTE — Progress Notes (Signed)
CPAP setup for patient. Has home nasal mask. No oxygen bled in. Auto settings 16 max and 5 min. Patient stated he will place self on and will call if needing any help.

## 2014-12-04 NOTE — Anesthesia Preprocedure Evaluation (Addendum)
Anesthesia Evaluation  Patient identified by MRN, date of birth, ID band Patient awake    Reviewed: Allergy & Precautions, H&P , NPO status , Patient's Chart, lab work & pertinent test results  History of Anesthesia Complications Negative for: history of anesthetic complications  Airway Mallampati: II  TM Distance: >3 FB Neck ROM: full    Dental  (+) Poor Dentition   Pulmonary asthma , sleep apnea , former smoker,    Pulmonary exam normal breath sounds clear to auscultation       Cardiovascular hypertension, + CAD  negative cardio ROS Normal cardiovascular exam Rhythm:regular Rate:Normal     Neuro/Psych  Headaches, negative neurological ROS  negative psych ROS   GI/Hepatic negative GI ROS, Neg liver ROS,   Endo/Other  negative endocrine ROSdiabetes, Type 2, Oral Hypoglycemic Agents, Insulin DependentHypothyroidism   Renal/GU negative Renal ROS  negative genitourinary   Musculoskeletal  (+) Arthritis ,   Abdominal (+) + obese,   Peds negative pediatric ROS (+)  Hematology negative hematology ROS (+)   Anesthesia Other Findings   Reproductive/Obstetrics negative OB ROS                          Anesthesia Physical  Anesthesia Plan  ASA: III  Anesthesia Plan: Regional, Spinal and MAC   Post-op Pain Management:    Induction: Intravenous  Airway Management Planned:   Additional Equipment:   Intra-op Plan:   Post-operative Plan: Extubation in OR  Informed Consent: I have reviewed the patients History and Physical, chart, labs and discussed the procedure including the risks, benefits and alternatives for the proposed anesthesia with the patient or authorized representative who has indicated his/her understanding and acceptance.   Dental Advisory Given  Plan Discussed with: Anesthesiologist, CRNA and Surgeon  Anesthesia Plan Comments: (Patient desires spinal if able)       Anesthesia Quick Evaluation

## 2014-12-04 NOTE — Anesthesia Procedure Notes (Addendum)
Anesthesia Regional Block:  Adductor canal block  Pre-Anesthetic Checklist: ,, timeout performed, Correct Patient, Correct Site, Correct Laterality, Correct Procedure, Correct Position, site marked, Risks and benefits discussed,  Surgical consent,  Pre-op evaluation,  At surgeon's request and post-op pain management  Laterality: Right  Prep: chloraprep       Needles:  Injection technique: Single-shot  Needle Type: Echogenic Stimulator Needle     Needle Length: 5cm 5 cm Needle Gauge: 21 and 21 G    Additional Needles:  Procedures: ultrasound guided (picture in chart) Adductor canal block Narrative:  Injection made incrementally with aspirations every 5 mL.  Performed by: Personally  Anesthesiologist: JUDD, BENJAMIN  Additional Notes: Risks, benefits and alternative to block explained extensively.  Patient tolerated procedure well, without complications.   Procedure Name: LMA Insertion Date/Time: 12/04/2014 3:37 PM Performed by: Clearnce Sorrel Pre-anesthesia Checklist: Patient identified, Timeout performed, Emergency Drugs available, Suction available and Patient being monitored Patient Re-evaluated:Patient Re-evaluated prior to inductionOxygen Delivery Method: Circle system utilized Preoxygenation: Pre-oxygenation with 100% oxygen Intubation Type: IV induction LMA: LMA inserted LMA Size: 4.0 Number of attempts: 1 Placement Confirmation: positive ETCO2 and breath sounds checked- equal and bilateral Tube secured with: Tape Dental Injury: Teeth and Oropharynx as per pre-operative assessment

## 2014-12-04 NOTE — Brief Op Note (Signed)
12/04/2014  10:34 PM  PATIENT:  Paul Baker  64 y.o. male  PRE-OPERATIVE DIAGNOSIS:  PATELLA LIGAMENT RUPTURE,STATUS POST right total knee replacement  POST-OPERATIVE DIAGNOSIS:  PATELLA LIGAMENT RUPTURE,STATUS POST right total knee replacement  PROCEDURE:  Procedure(s): Open repair right patella tendon  SURGEON:  Surgeon(s) and Role:    * Paralee Cancel, MD - Primary  PHYSICIAN ASSISTANT: Danae Orleans, PA-C  ANESTHESIA:   spinal and general  EBL:  Total I/O In: -  Out: 900 [Urine:900]  BLOOD ADMINISTERED:none  DRAINS: none   LOCAL MEDICATIONS USED:  NONE  SPECIMEN:  No Specimen  DISPOSITION OF SPECIMEN:  N/A  COUNTS:  YES  TOURNIQUET:   Total Tourniquet Time Documented: Thigh (Right) - 59 minutes Total: Thigh (Right) - 59 minutes   DICTATION: .Other Dictation: Dictation Number [id number]  PLAN OF CARE: Admit to inpatient   PATIENT DISPOSITION:  PACU - hemodynamically stable.   Delay start of Pharmacological VTE agent (>24hrs) due to surgical blood loss or risk of bleeding: no

## 2014-12-05 LAB — BASIC METABOLIC PANEL
Anion gap: 6 (ref 5–15)
BUN: 27 mg/dL — AB (ref 6–20)
CALCIUM: 8.8 mg/dL — AB (ref 8.9–10.3)
CO2: 26 mmol/L (ref 22–32)
Chloride: 104 mmol/L (ref 101–111)
Creatinine, Ser: 1.42 mg/dL — ABNORMAL HIGH (ref 0.61–1.24)
GFR calc Af Amer: 59 mL/min — ABNORMAL LOW (ref >60)
GFR, EST NON AFRICAN AMERICAN: 51 mL/min — AB (ref >60)
GLUCOSE: 215 mg/dL — AB (ref 65–99)
Potassium: 4.5 mmol/L (ref 3.5–5.1)
SODIUM: 136 mmol/L (ref 135–145)

## 2014-12-05 LAB — GLUCOSE, CAPILLARY
GLUCOSE-CAPILLARY: 193 mg/dL — AB (ref 65–99)
GLUCOSE-CAPILLARY: 161 mg/dL — AB (ref 65–99)

## 2014-12-05 LAB — CBC
HCT: 40.5 % (ref 39.0–52.0)
Hemoglobin: 12.7 g/dL — ABNORMAL LOW (ref 13.0–17.0)
MCH: 28.7 pg (ref 26.0–34.0)
MCHC: 31.4 g/dL (ref 30.0–36.0)
MCV: 91.6 fL (ref 78.0–100.0)
PLATELETS: 197 10*3/uL (ref 150–400)
RBC: 4.42 MIL/uL (ref 4.22–5.81)
RDW: 13.9 % (ref 11.5–15.5)
WBC: 8.2 10*3/uL (ref 4.0–10.5)

## 2014-12-05 NOTE — Progress Notes (Signed)
Subjective: 1 Day Post-Op Procedure(s) (LRB): KNEE ARTHROTOMY PATELLA LIGAMENT RECONSTRUSION AND REPAIR RIGHT KNEE (Right) Patient reports pain as mild.  Tolerating regular diet.  No n/v.  Pain controlled with oral meds.  No PT yet.  Objective: Vital signs in last 24 hours: Temp:  [97.3 F (36.3 C)-98.7 F (37.1 C)] 97.4 F (36.3 C) (12/03 0625) Pulse Rate:  [54-76] 60 (12/03 0842) Resp:  [11-18] 15 (12/03 0625) BP: (94-130)/(44-81) 107/61 mmHg (12/03 0842) SpO2:  [94 %-100 %] 98 % (12/03 0625) Weight:  [96.117 kg (211 lb 14.4 oz)] 96.117 kg (211 lb 14.4 oz) (12/02 1218)  Intake/Output from previous day: 12/02 0701 - 12/03 0700 In: 1041.2 [I.V.:941.2; IV Piggyback:100] Out: 1500 [Urine:1500] Intake/Output this shift:     Recent Labs  12/03/14 1108 12/05/14 0451  HGB 14.5 12.7*    Recent Labs  12/03/14 1108 12/05/14 0451  WBC 9.5 8.2  RBC 5.02 4.42  HCT 45.5 40.5  PLT 233 197    Recent Labs  12/03/14 1108 12/05/14 0451  NA 138 136  K 4.8 4.5  CL 104 104  CO2 26 26  BUN 22* 27*  CREATININE 1.29* 1.42*  GLUCOSE 160* 215*  CALCIUM 10.4* 8.8*    Recent Labs  12/03/14 1108  INR 0.93    PE:  wn wd male in nad.  R LE dressed and dry. Knee immobilizer in place.  NVI at R LE>  Assessment/Plan: 1 Day Post-Op Procedure(s) (LRB): KNEE ARTHROTOMY PATELLA LIGAMENT RECONSTRUSION AND REPAIR RIGHT KNEE (Right) Up with therapy  D/c home this afternoon.  Wylene Simmer 12/05/2014, 9:04 AM

## 2014-12-05 NOTE — Discharge Instructions (Signed)
Keep your knee dressing clean and dry.  You may bear weight on your right foot, but you MUST have the brace on your leg.  Call the office if you have any questions.  HZ:[phone]

## 2014-12-05 NOTE — Evaluation (Signed)
Physical Therapy Evaluation Patient Details Name: Paul Baker MRN: [id number] DOB: [birth year] Today's Date: 12/05/2014   History of Present Illness  Pt with prior TKA now presents with Rt patellar tendon rupture and surgical repair  Clinical Impression  Patient with adequate mobility for discharge.  Patient familiar with precautions, use of brace and exercises.  Determined no need for follow up PT until patient is able to engage in ROM Rt knee.  Pt agreed.      Follow Up Recommendations No PT follow up;Supervision - Intermittent    Equipment Recommendations  None recommended by PT    Recommendations for Other Services       Precautions / Restrictions Precautions Precautions: Fall Precaution Comments: No knee ROM Required Braces or Orthoses: Other Brace/Splint Other Brace/Splint: Bledsoe brace Restrictions Weight Bearing Restrictions: Yes RLE Weight Bearing: Weight bearing as tolerated      Mobility  Bed Mobility Overal bed mobility: Needs Assistance Bed Mobility: Supine to Sit     Supine to sit: Min guard     General bed mobility comments: assist with Rt leg, used rail  Transfers Overall transfer level: Needs assistance Equipment used: Rolling walker (2 wheeled) Transfers: Sit to/from Stand Sit to Stand: Min guard         General transfer comment: min cues for Rt leg management  Ambulation/Gait Ambulation/Gait assistance: Min guard Ambulation Distance (Feet): 250 Feet Assistive device: Rolling walker (2 wheeled) Gait Pattern/deviations: Step-through pattern;Decreased stance time - right        Science writer    Modified Rankin (Stroke Patients Only)       Balance Overall balance assessment: Needs assistance Sitting-balance support: No upper extremity supported;Feet supported Sitting balance-Leahy Scale: Good     Standing balance support: Bilateral upper extremity supported Standing balance-Leahy Scale:  Fair                               Pertinent Vitals/Pain Pain Assessment: 0-10 Pain Score: 4  Pain Location: Rt knee Pain Descriptors / Indicators: Sore;Aching Pain Intervention(s): Limited activity within patient's tolerance;Monitored during session;Premedicated before session    Home Living Family/patient expects to be discharged to:: Private residence Living Arrangements: Spouse/significant other Available Help at Discharge: Family;Available 24 hours/day Type of Home: House Home Access: Stairs to enter Entrance Stairs-Rails: None Entrance Stairs-Number of Steps: 1 Home Layout: One level Home Equipment: Walker - 2 wheels;Cane - single point;Shower seat      Prior Function Level of Independence: Independent               Hand Dominance   Dominant Hand: Right    Extremity/Trunk Assessment   Upper Extremity Assessment: Overall WFL for tasks assessed           Lower Extremity Assessment: Overall WFL for tasks assessed;Generalized weakness      Cervical / Trunk Assessment: Normal  Communication   Communication: No difficulties  Cognition Arousal/Alertness: Awake/alert Behavior During Therapy: WFL for tasks assessed/performed Overall Cognitive Status: Within Functional Limits for tasks assessed                      General Comments General comments (skin integrity, edema, etc.): don Bledsoe brace in supine min assist, pt familiar with brace (locked brace into full ext 0 degrees)    Exercises Total Joint Exercises Ankle Circles/Pumps: AROM;Right;10 reps;Supine Quad Sets: AROM;Right;10  reps;Supine Towel Squeeze: AROM;Both;10 reps;Supine Hip ABduction/ADduction: AAROM;Right;10 reps;Supine Straight Leg Raises: Right;10 reps;AAROM;Supine      Assessment/Plan    PT Assessment Patent does not need any further PT services  PT Diagnosis Difficulty walking;Generalized weakness;Acute pain   PT Problem List Decreased strength;Decreased  range of motion;Decreased activity tolerance;Decreased balance;Decreased mobility;Pain  PT Treatment Interventions     PT Goals (Current goals can be found in the Care Plan section) Acute Rehab PT Goals Patient Stated Goal: return home as soon as possible PT Goal Formulation: With patient    Frequency     Barriers to discharge        Co-evaluation               End of Session Equipment Utilized During Treatment: Gait belt;Other (comment) (Rt Bledsoe brace) Activity Tolerance: Patient tolerated treatment well;No increased pain Patient left: in chair;with call bell/phone within reach Nurse Communication: Mobility status;Precautions;Weight bearing status         Time: 0927-1027 PT Time Calculation (min) (ACUTE ONLY): 60 min   Charges:   PT Evaluation $Initial PT Evaluation Tier I: 1 Procedure PT Treatments $Gait Training: 8-22 mins $Therapeutic Exercise: 8-22 mins   PT G CodesMalka So, PT [phone]  [place] 12/05/2014, 7:54 PM

## 2014-12-05 NOTE — Op Note (Signed)
Paul Baker, Paul Baker NO.:  [id number]  MEDICAL RECORD NO.:  [id number]  LOCATION:  5N19C                        FACILITY:  [facility]  PHYSICIAN:  Pietro Cassis. Alvan Dame, M.D.  DATE OF BIRTH:  Jun 02, 1950  DATE OF PROCEDURE:  12/04/2014 DATE OF DISCHARGE:  12/05/2014                              OPERATIVE REPORT   PREOPERATIVE DIAGNOSIS:  Right patellar tendon rupture following total- knee arthroplasty, age indeterminate.  POSTOPERATIVE DIAGNOSIS:  Right patellar tendon rupture following total- knee arthroplasty, age indeterminate.  FINDINGS:  The patient was noted to have chronic attenuation and tearing of the distal aspect of the patellar tendon in the tibial tubercle region.  PROCEDURE:  Open repair of right patellar tendon injury utilizing a combination of sutures of #2 FiberWire suture and #1 Ethibond suture.  SURGEON:  Paralee Cancel, M.D.  ASSISTANT:  Danae Orleans, PA-C.  Please note that [name] was present for the entirety of the case from preoperative position, perioperative management of the operative extremity, general facilitation of the case, and primary wound closure.  ANESTHESIA:  Spinal plus general LMA.  SPECIMENS:  None.  COMPLICATIONS:  None.  TOURNIQUET:  59 minutes at 250 mmHg.  INDICATIONS FOR THE PROCEDURE:  [name] is a 64 year old male with a history of right total knee arthroplasty.  He has had multiple knee operations on the right lower extremity.  He was in his normal state of health until a couple of different falls over a period of time.  Most recently, he noted to have weakness and instability in the knee.  He was seen and evaluated in the office and noted to have a palpable defect on exam.  Subsequent MRI ordered, evaluated, and indicated disruption of the patellar tendon.  Though it was not exactly certain the timing of the injury, it was felt that he needed to go to the operating room to try to get this repaired back  to its normal anatomic position as possible to allow for quad strength and function and extension predominantly.  Risks of nonhealing of the tendon repair, re-rupture, and need for future surgery were discussed.  We discussed the potential concerns for persistent extensor lag and weakness with function, extension, and need for use of brace for activity.  Consent was obtained.  PROCEDURE IN DETAIL:  The patient was brought to the operative theater. Once adequate anesthesia, preoperative antibiotics, Ancef administered, he was positioned supine with a right thigh tourniquet placed.  The right lower extremity was then prepped and draped in sterile fashion. Time-out was performed identifying the patient, planned procedure, and extremity.  The right lower extremity was then exsanguinated, tourniquet elevated to 250 mmHg.  His previous knee arthroplasty incision was  utilized.  Soft tissue planes created.  At this point, we did not identify an acute rupture of the patellar tendon, but did feel the palpable defect in the inferior aspect.  At this point, I made a longitudinal incision over the patellar tendon with the purpose of trying to identify retinacular tissue  over top of the repair.  What I found at the distal aspect of the tendon at the insertion of the tibial tubercle was significant chronic change.  Following initial  debridement intra-articularly and irrigation of the joint, I decided at this point to repair this with a suture pattern.  I removed a wedge of tissue both medially and laterally from the retinacular tissue and then passed #2 FiberWire sutures into the tendon itself and into the distal retinacular tissues.  There were thus 4 sutures applied into each of the section.  I then reapproximated the medial and lateral sections together and then the 2 arms of sutures together centrally.  While I was doing this, I had Danae Orleans apply a distal pressure on the patella  to restore to its normal position allowing the repair to held into anatomic orientation.  Once this was done, I oversewed the longitudinal portion of the patellar tendon with a #1 Ethibond suture.  I used #1 Vicryls on the medial and lateral retinacular layer.  This would provide a significantly substantial repair of the patellar tendon down to the tibial tubercle with a retinacular repair and stability.  Following this, the wound was irrigated with normal saline solution.  We then reapproximated the subcu layer using 2-0 Vicryl and then a running 3-0 Monocryl.  The knee was cleaned, dried, and dressed sterilely using Mepilex dressing due to previous reaction to Aquacel dressing.  The patient was then brought to recovery room, extubated in stable condition tolerating the procedure well.  Findings were reviewed with his wife over the phone.  We will try to discharge him tomorrow.  He can be weightbearing as tolerated but must be in a knee immobilizer keeping his leg as straight as possible and prevent stress and strain on the repair. This was stressed with his wife and will be stressed with the patient.     Pietro Cassis Alvan Dame, M.D.    MDO/MEDQ  D:  12/04/2014  T:  12/05/2014  Job:  [id number]

## 2014-12-07 ENCOUNTER — Encounter (HOSPITAL_COMMUNITY): Payer: Self-pay | Admitting: Orthopedic Surgery

## 2014-12-09 NOTE — Anesthesia Postprocedure Evaluation (Addendum)
Anesthesia Post Note  Patient: Paul Baker  Procedure(s) Performed: Procedure(s) (LRB): KNEE ARTHROTOMY PATELLA LIGAMENT RECONSTRUSION AND REPAIR RIGHT KNEE (Right)  Patient location during evaluation: PACU Anesthesia Type: General Level of consciousness: awake and alert Pain management: pain level controlled Vital Signs Assessment: post-procedure vital signs reviewed and stable Respiratory status: spontaneous breathing, nonlabored ventilation, respiratory function stable and patient connected to nasal cannula oxygen Cardiovascular status: blood pressure returned to baseline and stable Postop Assessment: no signs of nausea or vomiting Anesthetic complications: no    Last Vitals:  Filed Vitals:   12/05/14 0625 12/05/14 0842  BP: 94/44 107/61  Pulse: 56 60  Temp: 36.3 C   Resp: 15     Last Pain:  Filed Vitals:   12/05/14 1101  PainSc: 4                  Zenaida Deed

## 2014-12-12 ENCOUNTER — Other Ambulatory Visit: Payer: Self-pay | Admitting: "Endocrinology

## 2014-12-15 NOTE — Discharge Summary (Signed)
Physician Discharge Summary  Patient ID: Paul Baker MRN: [id number] DOB/AGE: [birth year] 63 y.o.  Admit date: 12/04/2014 Discharge date: 12/05/2014   Procedures:  Procedure(s) (LRB): KNEE ARTHROTOMY PATELLA LIGAMENT RECONSTRUSION AND REPAIR RIGHT KNEE (Right)  Attending Physician:  Dr. Paralee Cancel   Admission Diagnoses:   Right patella tendon rupture s/p TKA   Discharge Diagnoses:  Active Problems:   Right patellar tendon rupture  Past Medical History  Diagnosis Date  . Arthritis   . Type 2 diabetes mellitus ([place])   . Mixed hyperlipidemia   . Essential hypertension, benign   . Septic arthritis of knee, left ([place])   . OSA (obstructive sleep apnea)   . Coronary atherosclerosis of native coronary artery     Diseased nondominant RCA  . Pulmonary embolism ([place]) 2004  . DVT (deep venous thrombosis) ([place]) 2005    Right arm  . Rotator cuff disorder     Left  . Colon polyp   . Morbid obesity ([place])   . RLS (restless legs syndrome)   . Hypersomnia     CPAP of 16 cm, diagnosed with AHI of 60 in 2012,epworth 21- narcolepsy?  [name] MRSA (methicillin resistant staph aureus) culture positive     08/2012  . Narcolepsy   . Asthma   . Headache   . History of nonmelanoma skin cancer   . Psoriasis   . History of transfusion   . PE (pulmonary embolism) 1982  . Hypothyroidism   . Anginal pain ([place])   . Peripheral vascular disease ([place])   . Pneumonia   . Cancer ([place])     basel cell removed beside  of his  right eye  . H/O blood clots     HPI:    Pt is a 64 y.o. male complaining of right knee pain. Pt states that in August he was getting out of an elevated truck, when he fell and hit his knee on the step for the truck. He also states that in September he was in his yard and the knee buckled on him. He has had continual pain in the right knee. He does have a history of a right TKA per Dr. Alvan Dame in March of 2016. He has been using a Bledsoe brace, and analgesic medications.  He states that his patella rides high with flexion of the knee. Various options are discussed with the patient. Risks, benefits and expectations were discussed with the patient. Patient understand the risks, benefits and expectations and wishes to proceed with surgery.   PCP: [name], MD   Discharged Condition: good  Hospital Course:  Patient underwent the above stated procedure on 12/04/2014. Patient tolerated the procedure well and brought to the recovery room in good condition and subsequently to the floor.  POD #1 BP: 107/61 ; Pulse: 60 ; Temp: 97.4 F (36.3 C) ; Resp: 15 Patient reports pain as mild. Tolerating regular diet. No n/v. Pain controlled with oral meds. No PT yet.  Ready to be discharged home. PE: wn wd male in nad. R LE dressed and dry. Knee immobilizer in place. NVI at R LE>  LABS  Basename    HGB     12.7  HCT     40.5    Discharge Exam: General appearance: alert, cooperative and no distress Extremities: Homans sign is negative, no sign of DVT, no edema, redness or tenderness in the calves or thighs and no ulcers, gangrene or trophic changes  Disposition: Home with follow up in 2  weeks   Follow-up Information    Follow up with Mauri Pole, MD. Schedule an appointment as soon as possible for a visit in 2 weeks.   Specialty:  Orthopedic Surgery   Contact information:   [address] [place] [address] [id number]       Discharge Instructions    Call MD / Call 911    Complete by:  As directed   If you experience chest pain or shortness of breath, CALL 911 and be transported to the hospital emergency room.  If you develope a fever above 101 F, pus (white drainage) or increased drainage or redness at the wound, or calf pain, call your surgeon's office.     Constipation Prevention    Complete by:  As directed   Drink plenty of fluids.  Prune juice may be helpful.  You may use a stool softener, such as Colace (over the counter)  100 mg twice a day.  Use MiraLax (over the counter) for constipation as needed.     Diet - low sodium heart healthy    Complete by:  As directed      Increase activity slowly as tolerated    Complete by:  As directed              Medication List    STOP taking these medications        HUMALOG KWIKPEN 200 UNIT/ML Sopn  Generic drug:  Insulin Lispro      TAKE these medications        amLODipine 5 MG tablet  Commonly known as:  NORVASC  TAKE ONE TABLET BY MOUTH ONCE DAILY.     aspirin EC 325 MG tablet  Take 1 tablet (325 mg total) by mouth 2 (two) times daily. Take for 4 weeks.     clobetasol 0.05 % external solution  Commonly known as:  TEMOVATE  Apply 1 application topically daily as needed (irritation.).     clonazePAM 1 MG tablet  Commonly known as:  KLONOPIN  TAKE (1) TABLET BY MOUTH AT BEDTIME FOR RESTLESS LEGS.     fenofibrate 160 MG tablet  TAKE (1) TABLET BY MOUTH AT BEDTIME.     HYDROmorphone 2 MG tablet  Commonly known as:  DILAUDID  Take 0.5 tablets (1 mg total) by mouth every 4 (four) hours as needed for moderate pain.     HYDROmorphone 2 MG tablet  Commonly known as:  DILAUDID  Take 1-2 tablets (2-4 mg total) by mouth every 4 (four) hours as needed for severe pain.     isosorbide mononitrate 60 MG 24 hr tablet  Commonly known as:  IMDUR  TAKE (1/2) TABLET BY MOUTH TWICE DAILY.     LEVEMIR FLEXTOUCH 100 UNIT/ML Pen  Generic drug:  Insulin Detemir  INJECT 50 UNITS TWO TIMES DAILY.     levothyroxine 112 MCG tablet  Commonly known as:  SYNTHROID, LEVOTHROID  TAKE (1) TABLET BY MOUTH EACH MORNING.     loratadine 10 MG tablet  Commonly known as:  CLARITIN  Take 10 mg by mouth at bedtime as needed for allergies.     losartan 50 MG tablet  Commonly known as:  COZAAR  Take 50 mg by mouth at bedtime.     metFORMIN 500 MG tablet  Commonly known as:  GLUCOPHAGE  Take 500 mg by mouth 2 (two) times daily with a meal.     methocarbamol 500 MG tablet    Commonly known as:  ROBAXIN  Take 1 tablet (500 mg total) by mouth every 6 (six) hours as needed for muscle spasms.     metoprolol succinate 50 MG 24 hr tablet  Commonly known as:  TOPROL-XL  TAKE (1) TABLET BY MOUTH TWICE DAILY.     NITROSTAT 0.4 MG SL tablet  Generic drug:  nitroGLYCERIN  DISSOLVE 1 TABLET UNDER TONGUE EVERY 5 MINUTES UP TO 15 MIN FOR CHESTPAIN. IF NO RELIEF CALL 911.     NUVIGIL 200 MG Tabs  Generic drug:  Armodafinil  Take 1 tablet by mouth daily.     omeprazole 20 MG capsule  Commonly known as:  PRILOSEC  TAKE (1) CAPSULE BY MOUTH TWICE DAILY.     ONE TOUCH ULTRA TEST test strip  Generic drug:  glucose blood  1 each by Other route. Four to five times daily.     polyvinyl alcohol 1.4 % ophthalmic solution  Commonly known as:  LIQUIFILM TEARS  Place 1 drop into both eyes daily as needed for dry eyes.     pravastatin 10 MG tablet  Commonly known as:  PRAVACHOL  Take 10 mg by mouth daily.     rOPINIRole 4 MG tablet  Commonly known as:  REQUIP  TAKE 1 TO 1 & 1/2 TABLETS BY MOUTH AT BEDTIME.     sodium chloride 0.65 % Soln nasal spray  Commonly known as:  OCEAN  Place 1-2 sprays into both nostrils daily as needed for congestion.     Testosterone Cypionate & Prop 200-20 MG/ML Soln  Inject 1 mL into the muscle every 14 (fourteen) days.     triamterene-hydrochlorothiazide 37.5-25 MG tablet  Commonly known as:  MAXZIDE-25  TAKE ONE TABLET BY MOUTH ONCE DAILY.     VENTOLIN HFA 108 (90 BASE) MCG/ACT inhaler  Generic drug:  albuterol  Inhale 1 puff into the lungs every 4 (four) hours as needed for wheezing or shortness of breath.     Vitamin D (Ergocalciferol) 50000 UNITS Caps capsule  Commonly known as:  DRISDOL  TAKE 1 CAPSULE BY MOUTH ONCE A WEEK.         Signed: [place] Pugh. Jaquon Gingerich   PA-C  12/15/2014, 12:52 PM

## 2014-12-16 NOTE — Addendum Note (Signed)
Addendum  created 12/16/14 1753 by Jillyn Hidden, MD   Modules edited: Clinical Notes   Clinical Notes:  [place]: [id number]

## 2014-12-17 ENCOUNTER — Other Ambulatory Visit: Payer: Self-pay | Admitting: Cardiology

## 2014-12-21 DIAGNOSIS — S86811D Strain of other muscle(s) and tendon(s) at lower leg level, right leg, subsequent encounter: Secondary | ICD-10-CM | POA: Diagnosis not present

## 2014-12-21 DIAGNOSIS — Z4789 Encounter for other orthopedic aftercare: Secondary | ICD-10-CM | POA: Diagnosis not present

## 2014-12-31 DIAGNOSIS — E291 Testicular hypofunction: Secondary | ICD-10-CM | POA: Diagnosis not present

## 2015-01-02 ENCOUNTER — Other Ambulatory Visit: Payer: Self-pay | Admitting: "Endocrinology

## 2015-01-06 DIAGNOSIS — S86811D Strain of other muscle(s) and tendon(s) at lower leg level, right leg, subsequent encounter: Secondary | ICD-10-CM | POA: Diagnosis not present

## 2015-01-06 DIAGNOSIS — Z4789 Encounter for other orthopedic aftercare: Secondary | ICD-10-CM | POA: Diagnosis not present

## 2015-01-13 DIAGNOSIS — I10 Essential (primary) hypertension: Secondary | ICD-10-CM | POA: Diagnosis not present

## 2015-01-13 DIAGNOSIS — R42 Dizziness and giddiness: Secondary | ICD-10-CM | POA: Diagnosis not present

## 2015-01-13 DIAGNOSIS — N189 Chronic kidney disease, unspecified: Secondary | ICD-10-CM | POA: Diagnosis not present

## 2015-01-13 DIAGNOSIS — E119 Type 2 diabetes mellitus without complications: Secondary | ICD-10-CM | POA: Diagnosis not present

## 2015-01-14 DIAGNOSIS — E291 Testicular hypofunction: Secondary | ICD-10-CM | POA: Diagnosis not present

## 2015-01-16 ENCOUNTER — Other Ambulatory Visit: Payer: Self-pay | Admitting: "Endocrinology

## 2015-01-16 ENCOUNTER — Other Ambulatory Visit: Payer: Self-pay | Admitting: Family Medicine

## 2015-01-28 DIAGNOSIS — E291 Testicular hypofunction: Secondary | ICD-10-CM | POA: Diagnosis not present

## 2015-01-29 ENCOUNTER — Other Ambulatory Visit: Payer: Self-pay | Admitting: Neurology

## 2015-01-30 ENCOUNTER — Other Ambulatory Visit: Payer: Self-pay | Admitting: "Endocrinology

## 2015-02-01 ENCOUNTER — Ambulatory Visit (INDEPENDENT_AMBULATORY_CARE_PROVIDER_SITE_OTHER): Payer: Medicare Other | Admitting: Family Medicine

## 2015-02-01 ENCOUNTER — Encounter: Payer: Self-pay | Admitting: Family Medicine

## 2015-02-01 VITALS — BP 118/70 | Ht 65.0 in | Wt 222.0 lb

## 2015-02-01 DIAGNOSIS — E118 Type 2 diabetes mellitus with unspecified complications: Secondary | ICD-10-CM

## 2015-02-01 DIAGNOSIS — E785 Hyperlipidemia, unspecified: Secondary | ICD-10-CM

## 2015-02-01 DIAGNOSIS — I10 Essential (primary) hypertension: Secondary | ICD-10-CM

## 2015-02-01 DIAGNOSIS — J329 Chronic sinusitis, unspecified: Secondary | ICD-10-CM | POA: Diagnosis not present

## 2015-02-01 DIAGNOSIS — J310 Chronic rhinitis: Secondary | ICD-10-CM

## 2015-02-01 LAB — POCT GLYCOSYLATED HEMOGLOBIN (HGB A1C): Hemoglobin A1C: 8

## 2015-02-01 MED ORDER — AMOXICILLIN-POT CLAVULANATE 875-125 MG PO TABS: ORAL_TABLET | ORAL | Status: DC

## 2015-02-01 MED ORDER — FENOFIBRATE 160 MG PO TABS: ORAL_TABLET | ORAL | Status: DC

## 2015-02-01 MED ORDER — TRIAMTERENE-HCTZ 37.5-25 MG PO TABS: 1.0000 | ORAL_TABLET | Freq: Every day | ORAL | Status: DC

## 2015-02-01 MED ORDER — PRAVASTATIN SODIUM 20 MG PO TABS: 20.0000 mg | ORAL_TABLET | Freq: Every day | ORAL | Status: DC

## 2015-02-01 MED ORDER — OMEPRAZOLE 20 MG PO CPDR: DELAYED_RELEASE_CAPSULE | ORAL | Status: DC

## 2015-02-01 NOTE — [place] Notes (Signed)
   Subjective:    Patient ID: Paul Baker, male    DOB: December 10, 1950, 65 y.o.   MRN: [id number]  Diabetes He presents for his follow-up diabetic visit. He has type 2 diabetes mellitus. No MedicAlert identification noted. He has not had a previous visit with a dietitian. He does not see a podiatrist.Eye exam is current (August 2016 ).   sugars elevated lately no sugars in the mid 150s.  Patient claims compliance with lipid medicine. Does not miss a dose. No obvious side effects. Watching fat intake  Patient has c/o of nasal congestion.some low gr fevdr, did not last, cough off and on, some secretions. Frontal headache worse with change of positions  BP rexcellent and in good conytrol, patient claims compliance with his blood pressure medications. No obvious side effects. Does not miss a dose   resing h r in the sitiessinus pressure, and head ache  Left ear painful an dhuritng significantly   Results for orders placed or performed in visit on 02/01/15  POCT HgB A1C  Result Value Ref Range   Hemoglobin A1C 8.0     Review of Systems No headache no chest pain no back pain abdominal pain ROS otherwise negative    Objective:   Physical Exam Alert vital stable blood pressure good on repeat H&T mom his congestion frontal extra-axial neck supple lungs clear heart regular rhythm ankles trace edema       Assessment & Plan:  Impression 1 rhinosinusitis discussed timeframe antibiotics No. 2 hypertension good control maintain same meds were 3 type 2 diabetes suboptimum control discussed increase Levemir rationale discussed #4 hyperlipidemia. Maintain same meds pending blood work plan diet exercise discussed antibiotics prescribed. Chronic meds refilled appropriate blood work further recommendations based results [facility]

## 2015-02-11 DIAGNOSIS — E291 Testicular hypofunction: Secondary | ICD-10-CM | POA: Diagnosis not present

## 2015-02-13 ENCOUNTER — Other Ambulatory Visit: Payer: Self-pay | Admitting: Cardiology

## 2015-02-15 ENCOUNTER — Ambulatory Visit: Payer: Medicare Other | Admitting: Cardiology

## 2015-02-16 DIAGNOSIS — M5136 Other intervertebral disc degeneration, lumbar region: Secondary | ICD-10-CM | POA: Diagnosis not present

## 2015-02-16 DIAGNOSIS — M4806 Spinal stenosis, lumbar region: Secondary | ICD-10-CM | POA: Diagnosis not present

## 2015-02-17 DIAGNOSIS — S86811D Strain of other muscle(s) and tendon(s) at lower leg level, right leg, subsequent encounter: Secondary | ICD-10-CM | POA: Diagnosis not present

## 2015-02-17 DIAGNOSIS — Z96651 Presence of right artificial knee joint: Secondary | ICD-10-CM | POA: Diagnosis not present

## 2015-02-22 ENCOUNTER — Other Ambulatory Visit: Payer: Self-pay | Admitting: "Endocrinology

## 2015-02-22 DIAGNOSIS — E038 Other specified hypothyroidism: Secondary | ICD-10-CM | POA: Diagnosis not present

## 2015-02-22 DIAGNOSIS — Z794 Long term (current) use of insulin: Secondary | ICD-10-CM | POA: Diagnosis not present

## 2015-02-22 DIAGNOSIS — E785 Hyperlipidemia, unspecified: Secondary | ICD-10-CM | POA: Diagnosis not present

## 2015-02-22 DIAGNOSIS — E1165 Type 2 diabetes mellitus with hyperglycemia: Secondary | ICD-10-CM | POA: Diagnosis not present

## 2015-02-22 DIAGNOSIS — E1159 Type 2 diabetes mellitus with other circulatory complications: Secondary | ICD-10-CM | POA: Diagnosis not present

## 2015-02-22 LAB — BASIC METABOLIC PANEL
BUN: 27 mg/dL — ABNORMAL HIGH (ref 7–25)
CALCIUM: 9.5 mg/dL (ref 8.6–10.3)
CO2: 27 mmol/L (ref 20–31)
CREATININE: 1.21 mg/dL (ref 0.70–1.25)
Chloride: 103 mmol/L (ref 98–110)
GLUCOSE: 106 mg/dL — AB (ref 65–99)
Potassium: 4.9 mmol/L (ref 3.5–5.3)
Sodium: 139 mmol/L (ref 135–146)

## 2015-02-22 LAB — TSH: TSH: 1.29 m[IU]/L (ref 0.40–4.50)

## 2015-02-22 LAB — HEMOGLOBIN A1C
Hgb A1c MFr Bld: 9.2 % — ABNORMAL HIGH (ref <5.7)
Mean Plasma Glucose: 217 mg/dL — ABNORMAL HIGH (ref <117)

## 2015-02-22 LAB — T4, FREE: Free T4: 1.2 ng/dL (ref 0.8–1.8)

## 2015-02-23 LAB — LIPID PANEL
CHOL/HDL RATIO: 3.4 ratio (ref 0.0–5.0)
Cholesterol, Total: 176 mg/dL (ref 100–199)
HDL: 52 mg/dL (ref >39)
LDL CALC: 105 mg/dL — AB (ref 0–99)
Triglycerides: 96 mg/dL (ref 0–149)
VLDL Cholesterol Cal: 19 mg/dL (ref 5–40)

## 2015-02-23 LAB — HEPATIC FUNCTION PANEL
ALBUMIN: 4.3 g/dL (ref 3.6–4.8)
ALT: 13 IU/L (ref 0–44)
AST: 16 IU/L (ref 0–40)
Alkaline Phosphatase: 42 IU/L (ref 39–117)
BILIRUBIN TOTAL: 0.2 mg/dL (ref 0.0–1.2)
BILIRUBIN, DIRECT: 0.1 mg/dL (ref 0.00–0.40)
TOTAL PROTEIN: 6.4 g/dL (ref 6.0–8.5)

## 2015-02-25 DIAGNOSIS — E291 Testicular hypofunction: Secondary | ICD-10-CM | POA: Diagnosis not present

## 2015-02-26 ENCOUNTER — Encounter: Payer: Self-pay | Admitting: Cardiology

## 2015-02-26 ENCOUNTER — Ambulatory Visit (INDEPENDENT_AMBULATORY_CARE_PROVIDER_SITE_OTHER): Payer: Medicare Other | Admitting: Cardiology

## 2015-02-26 VITALS — BP 128/70 | HR 92 | Ht 64.0 in | Wt 213.0 lb

## 2015-02-26 DIAGNOSIS — I10 Essential (primary) hypertension: Secondary | ICD-10-CM

## 2015-02-26 DIAGNOSIS — E782 Mixed hyperlipidemia: Secondary | ICD-10-CM

## 2015-02-26 DIAGNOSIS — I25119 Atherosclerotic heart disease of native coronary artery with unspecified angina pectoris: Secondary | ICD-10-CM

## 2015-02-26 MED ORDER — NITROGLYCERIN 0.4 MG SL SUBL: SUBLINGUAL_TABLET | SUBLINGUAL | Status: DC

## 2015-02-26 NOTE — Patient Instructions (Signed)
Your physician wants you to follow-up in: 6 months with Dr McDowell You will receive a reminder letter in the mail two months in advance. If you don't receive a letter, please call our office to schedule the follow-up appointment.     Your physician recommends that you continue on your current medications as directed. Please refer to the Current Medication list given to you today.    If you need a refill on your cardiac medications before your next appointment, please call your pharmacy.     Thank you for choosing [facility] !        

## 2015-02-26 NOTE — [place] Notes (Signed)
Cardiology Office Note  Date: 02/26/2015   ID: Paul Baker, DOB 19-Mar-1950, MRN [id number]  PCP: Paul Hillier, MD  Primary Cardiologist: Paul Lesches, MD   Chief Complaint  Patient presents with  . Coronary Artery Disease    History of Present Illness: Paul Baker is a 66 y.o. male last seen in August 2016. He presents for a routine cardiac visit. He has had no change in angina frequency or nitroglycerin use. He has just now gotten back to exercise having undergone surgery back in December 2016. Records reviewed, patient underwent repair of ruptured right patellar tendon at that time. He just got his brace off this week. States he is really lost a lot of the stamina that he had gained last year.  He reports NYHA class II dyspnea with typical activities. No palpitations or dizziness. I reviewed his medications which are outlined below. Cardiac regimen includes Maxide, Pravachol, as needed nitroglycerin, Cozaar, Toprol-XL, Norvasc, and Imdur. I reviewed his ECG today which showed normal sinus rhythm, no significant changes.  He did have recent lab work, LDL was 105, HDL 52.  Past Medical History  Diagnosis Date  . Arthritis   . Type 2 diabetes mellitus (Paul Baker)   . Mixed hyperlipidemia   . Essential hypertension, benign   . Septic arthritis of knee, left (Paul Baker)   . OSA (obstructive sleep apnea)   . Coronary atherosclerosis of native coronary artery     Diseased nondominant RCA  . Pulmonary embolism (Paul Baker) 2004  . DVT (deep venous thrombosis) (Paul Baker) 2005    Right arm  . Rotator cuff disorder     Left  . Colon polyp   . Morbid obesity (Paul Baker)   . RLS (restless legs syndrome)   . Hypersomnia     CPAP of 16 cm, diagnosed with AHI of 60 in 2012,epworth 21- narcolepsy?  Paul Baker Kitchen MRSA (methicillin resistant staph aureus) culture positive     08/2012  . Narcolepsy   . Asthma   . Headache   . Skin cancer, basal cell   . Psoriasis   . History of transfusion   . Hypothyroidism    . Pneumonia     Current Outpatient Prescriptions  Medication Sig Dispense Refill  . amLODipine (NORVASC) 5 MG tablet TAKE ONE TABLET BY MOUTH ONCE DAILY. 30 tablet 6  . Armodafinil (NUVIGIL) 200 MG TABS Take 1 tablet by mouth daily.    . clobetasol (TEMOVATE) 0.05 % external solution Apply 1 application topically daily as needed (irritation.).     Paul Baker Kitchen clonazePAM (KLONOPIN) 1 MG tablet TAKE (1) TABLET BY MOUTH AT BEDTIME FOR RESTLESS LEGS. 30 tablet 5  . fenofibrate 160 MG tablet TAKE (1) TABLET BY MOUTH AT BEDTIME. 30 tablet 5  . HUMALOG KWIKPEN 200 UNIT/ML SOPN INJECT UP TO 31 UNITS THREE TIMES DAILY WITH MEALS AS DIRECTED. 12 mL 2  . HYDROmorphone (DILAUDID) 2 MG tablet Take 0.5 tablets (1 mg total) by mouth every 4 (four) hours as needed for moderate pain. 21 tablet 0  . isosorbide mononitrate (IMDUR) 60 MG 24 hr tablet TAKE (1/2) TABLET BY MOUTH TWICE DAILY. 60 tablet 2  . LEVEMIR FLEXTOUCH 100 UNIT/ML Pen INJECT 50 UNITS TWO TIMES DAILY. (Patient taking differently: INJECT 100 UNITS AT BEDTIME) 30 mL 2  . levothyroxine (SYNTHROID, LEVOTHROID) 112 MCG tablet TAKE (1) TABLET BY MOUTH EACH MORNING. 30 tablet 2  . loratadine (CLARITIN) 10 MG tablet Take 10 mg by mouth at bedtime as needed for allergies.     Paul Baker Kitchen  losartan (COZAAR) 50 MG tablet Take 50 mg by mouth at bedtime.    . metFORMIN (GLUCOPHAGE) 500 MG tablet Take 500 mg by mouth 2 (two) times daily with a meal.    . metoprolol succinate (TOPROL-XL) 50 MG 24 hr tablet TAKE (1) TABLET BY MOUTH TWICE DAILY. 60 tablet 5  . NITROSTAT 0.4 MG SL tablet DISSOLVE 1 TABLET UNDER TONGUE EVERY 5 MINUTES UP TO 15 MIN FOR CHESTPAIN. IF NO RELIEF CALL 911. 25 tablet 3  . ONE TOUCH ULTRA TEST test strip 1 each by Other route. Four to five times daily.    [name] DELICA LANCETS [id number] MISC USE AS DIRECTED TO TEST BLOOD SUGAR 4 TIMES DAILY. 150 each 5  . polyvinyl alcohol (LIQUIFILM TEARS) 1.4 % ophthalmic solution Place 1 drop into both eyes daily as  needed for dry eyes.    . pravastatin (PRAVACHOL) 20 MG tablet Take 1 tablet (20 mg total) by mouth daily. 30 tablet 5  . rOPINIRole (REQUIP) 4 MG tablet TAKE 1 TO 1 & 1/2 TABLETS BY MOUTH AT BEDTIME. 45 tablet 0  . sodium chloride (OCEAN) 0.65 % SOLN nasal spray Place 1-2 sprays into both nostrils daily as needed for congestion.    . Testosterone Cypionate & Prop 200-20 MG/ML SOLN Inject 1 mL into the muscle every 14 (fourteen) days.    Paul Baker Kitchen triamterene-hydrochlorothiazide (MAXZIDE-25) 37.5-25 MG tablet Take 1 tablet by mouth daily. 30 tablet 5  . VENTOLIN HFA 108 (90 BASE) MCG/ACT inhaler Inhale 1 puff into the lungs every 4 (four) hours as needed for wheezing or shortness of breath.     . Vitamin D, Ergocalciferol, (DRISDOL) 50000 units CAPS capsule TAKE 1 CAPSULE BY MOUTH ONCE A WEEK. 12 capsule 0   No current facility-administered medications for this visit.   Allergies:  Cardizem; Cardura; Lipitor; Paxil; Adhesive; and Codeine   Social History: The patient  reports that he quit smoking about 20 years ago. His smoking use included Cigarettes. He started smoking about 47 years ago. He has never used smokeless tobacco. He reports that he does not drink alcohol or use illicit drugs.   ROS:  Please see the history of present illness. Otherwise, complete review of systems is positive for right knee stiffness.  All other systems are reviewed and negative.   Physical Exam: VS:  BP 128/70 mmHg  Pulse 92  Ht 5\' 4"  (1.626 m)  Wt 213 lb (96.616 kg)  BMI 36.54 kg/m2  SpO2 96%, BMI Body mass index is 36.54 kg/(m^2).  Wt Readings from Last 3 Encounters:  02/26/15 213 lb (96.616 kg)  02/01/15 222 lb (100.699 kg)  12/04/14 211 lb 14.4 oz (96.117 kg)    Overweight male, appears comfortable at rest. HEENT: Conjunctiva and lids normal, oropharynx clear. Neck: Supple, no elevated JVP or carotid bruits, no thyromegaly. Lungs: Clear to auscultation, nonlabored breathing at rest. Cardiac: Regular rate  and rhythm, no S3 or significant systolic murmur, no pericardial rub. Abdomen: Soft, nontender, bowel sounds present, no guarding or rebound. Extremities: No pitting edema, right knee with well healed anterior incision, distal pulses 2+.  ECG: I personally reviewed the prior tracing from 2/70/2016 which showed normal sinus rhythm.  Recent Labwork: 12/05/2014: Hemoglobin 12.7*; Platelets 197 02/22/2015: ALT 13; AST 16; BUN 27*; Creat 1.21; Potassium 4.9; Sodium 139; TSH 1.29     Component Value Date/Time   CHOL 176 02/22/2015 0938   CHOL 211* 11/13/2013 0843   TRIG 96 02/22/2015 0938   HDL  52 02/22/2015 0938   HDL 49 11/13/2013 0843   CHOLHDL 3.4 02/22/2015 0938   CHOLHDL 4.3 11/13/2013 0843   VLDL 31 11/13/2013 0843   LDLCALC 105* 02/22/2015 0938   LDLCALC 131* 11/13/2013 0843   Assessment and Plan:  1. Stable angina symptoms on medical therapy with CAD including diseased nondominant RCA which is managed medically. Plan is to continue observation for now.  2. Essential hypertension, blood pressure control is good today.  3. Hyperlipidemia, on Pravachol. Recent LDL 105. He is just cutting back to regular exercise plan.  Current medicines were reviewed with the patient today.   Orders Placed This Encounter  Procedures  . EKG 12-Lead    Disposition: FU with me in 6 months.   Signed, Satira Sark, MD, [facility] 02/26/2015 10:03 AM    [facility] [place]. [address], [place], [place] [address] Phone: [phone]; Fax: [phone]

## 2015-02-27 DIAGNOSIS — H66001 Acute suppurative otitis media without spontaneous rupture of ear drum, right ear: Secondary | ICD-10-CM | POA: Diagnosis not present

## 2015-02-27 DIAGNOSIS — J111 Influenza due to unidentified influenza virus with other respiratory manifestations: Secondary | ICD-10-CM | POA: Diagnosis not present

## 2015-03-01 ENCOUNTER — Ambulatory Visit: Payer: Medicare Other | Admitting: "Endocrinology

## 2015-03-01 ENCOUNTER — Encounter: Payer: Self-pay | Admitting: Family Medicine

## 2015-03-03 ENCOUNTER — Other Ambulatory Visit: Payer: Self-pay | Admitting: "Endocrinology

## 2015-03-03 DIAGNOSIS — E291 Testicular hypofunction: Secondary | ICD-10-CM | POA: Diagnosis not present

## 2015-03-03 DIAGNOSIS — R972 Elevated prostate specific antigen [PSA]: Secondary | ICD-10-CM | POA: Diagnosis not present

## 2015-03-10 ENCOUNTER — Encounter: Payer: Self-pay | Admitting: "Endocrinology

## 2015-03-10 ENCOUNTER — Ambulatory Visit (INDEPENDENT_AMBULATORY_CARE_PROVIDER_SITE_OTHER): Payer: Medicare Other | Admitting: "Endocrinology

## 2015-03-10 VITALS — BP 140/84 | HR 75 | Ht 65.0 in | Wt 212.0 lb

## 2015-03-10 DIAGNOSIS — I25119 Atherosclerotic heart disease of native coronary artery with unspecified angina pectoris: Secondary | ICD-10-CM | POA: Diagnosis not present

## 2015-03-10 DIAGNOSIS — E785 Hyperlipidemia, unspecified: Secondary | ICD-10-CM

## 2015-03-10 DIAGNOSIS — E1159 Type 2 diabetes mellitus with other circulatory complications: Secondary | ICD-10-CM

## 2015-03-10 DIAGNOSIS — I10 Essential (primary) hypertension: Secondary | ICD-10-CM | POA: Diagnosis not present

## 2015-03-10 DIAGNOSIS — E291 Testicular hypofunction: Secondary | ICD-10-CM | POA: Diagnosis not present

## 2015-03-10 DIAGNOSIS — E038 Other specified hypothyroidism: Secondary | ICD-10-CM | POA: Diagnosis not present

## 2015-03-10 DIAGNOSIS — Z0000 Encounter for general adult medical examination without abnormal findings: Secondary | ICD-10-CM | POA: Diagnosis not present

## 2015-03-10 MED ORDER — LEVOTHYROXINE SODIUM 125 MCG PO TABS: 125.0000 ug | ORAL_TABLET | Freq: Every day | ORAL | Status: DC

## 2015-03-10 NOTE — Progress Notes (Signed)
Subjective:    Patient ID: Paul Baker, male    DOB: 08-19-1950, PCP [name], MD   Past Medical History  Diagnosis Date  . Arthritis   . Type 2 diabetes mellitus ([place])   . Mixed hyperlipidemia   . Essential hypertension, benign   . Septic arthritis of knee, left ([place])   . OSA (obstructive sleep apnea)   . Coronary atherosclerosis of native coronary artery     Diseased nondominant RCA  . Pulmonary embolism ([place]) 2004  . DVT (deep venous thrombosis) ([place]) 2005    Right arm  . Rotator cuff disorder     Left  . Colon polyp   . Morbid obesity ([place])   . RLS (restless legs syndrome)   . Hypersomnia     CPAP of 16 cm, diagnosed with AHI of 60 in 2012,[place] 21- narcolepsy?  [name] MRSA (methicillin resistant staph aureus) culture positive     08/2012  . Narcolepsy   . Asthma   . Headache   . Skin cancer, basal cell   . Psoriasis   . History of transfusion   . Hypothyroidism   . Pneumonia    Past Surgical History  Procedure Laterality Date  . Total knee arthroplasty  2003    LEFT  . Lumbar disc surgery      Left L3, L4, L5 discecotomy with decompression of L4 root  . Wrist surgery    . Total knee revision  2005    LEFT  . Colonoscopy    . Back surgery    . Knee surgery      X 25 TIMES  . Total knee arthroplasty Right 03/23/2014    Procedure: RIGHT TOTAL KNEE ARTHROPLASTY AND REMOVAL RIGHT TIBIAL  DEEP IMPLANT STAPLE;  Surgeon: Paralee Cancel, MD;  Location: [facility];  Service: Orthopedics;  Laterality: Right;  . Tonsillectomy    . Hip surgery      bone removed from both sides of hip  . Eye surgery Left 2016    laser to left eye  . Cataract extraction Bilateral   . Knee arthrotomy Right 12/04/2014    Procedure: KNEE ARTHROTOMY PATELLA LIGAMENT RECONSTRUSION AND REPAIR RIGHT KNEE;  Surgeon: Paralee Cancel, MD;  Location: [facility];  Service: Orthopedics;  Laterality: Right;   Social History   Social History  . Marital Status: Married    Spouse Name: Toney Reil  .  Number of Children: 2  . Years of Education: college   Occupational History  . Disabled   .     Social History Main Topics  . Smoking status: Former Smoker    Types: Cigarettes    Start date: 06/19/1967    Quit date: 01/03/1995  . Smokeless tobacco: Never Used  . Alcohol Use: No     Comment: quit drinking in 07/86  . Drug Use: No  . Sexual Activity:    Partners: Female   Other Topics Concern  . None   Social History Narrative    65 year old, right-handed, caucasian male with a past medical history of obesity, hypertension, hyperlipidemia, diabetes, obstructive sleep apnea, presenting with frequent nighttime awakenings, excessive daytime sleepiness, also transient confusional episodes.RLS and one beosity, OSA on CPAP with AHI of 3.2 and  setting of 16 cm water , [facility] .   Outpatient Encounter Prescriptions as of 03/10/2015  Medication Sig  . amLODipine (NORVASC) 5 MG tablet TAKE ONE TABLET BY MOUTH ONCE DAILY.  [name] Armodafinil (NUVIGIL) 200 MG TABS Take 1  tablet by mouth daily.  . clobetasol (TEMOVATE) 0.05 % external solution Apply 1 application topically daily as needed (irritation.).   [name] clonazePAM (KLONOPIN) 1 MG tablet TAKE (1) TABLET BY MOUTH AT BEDTIME FOR RESTLESS LEGS.  . fenofibrate 160 MG tablet TAKE (1) TABLET BY MOUTH AT BEDTIME.  [name] HUMALOG KWIKPEN 200 UNIT/ML SOPN INJECT UP TO 31 UNITS THREE TIMES DAILY WITH MEALS AS DIRECTED.  [name] HYDROmorphone (DILAUDID) 2 MG tablet Take 0.5 tablets (1 mg total) by mouth every 4 (four) hours as needed for moderate pain.  . isosorbide mononitrate (IMDUR) 60 MG 24 hr tablet TAKE (1/2) TABLET BY MOUTH TWICE DAILY.  [name] LEVEMIR FLEXTOUCH 100 UNIT/ML Pen INJECT 50 UNITS TWO TIMES DAILY.  [name] levothyroxine (SYNTHROID, LEVOTHROID) 125 MCG tablet Take 1 tablet (125 mcg total) by mouth daily before breakfast.  . loratadine (CLARITIN) 10 MG tablet Take 10 mg by mouth at bedtime as needed for allergies.   [name] losartan (COZAAR) 50 MG tablet  Take 50 mg by mouth at bedtime.  . metFORMIN (GLUCOPHAGE) 500 MG tablet Take 500 mg by mouth 2 (two) times daily with a meal.  . metoprolol succinate (TOPROL-XL) 50 MG 24 hr tablet TAKE (1) TABLET BY MOUTH TWICE DAILY.  . nitroGLYCERIN (NITROSTAT) 0.4 MG SL tablet DISSOLVE 1 TABLET UNDER TONGUE EVERY 5 MINUTES UP TO 15 MIN FOR CHESTPAIN. IF NO RELIEF CALL 911.  [name] ONE TOUCH ULTRA TEST test strip 1 each by Other route. Four to five times daily.  [name] DELICA LANCETS 99991111 MISC USE AS DIRECTED TO TEST BLOOD SUGAR 4 TIMES DAILY.  [name] polyvinyl alcohol (LIQUIFILM TEARS) 1.4 % ophthalmic solution Place 1 drop into both eyes daily as needed for dry eyes.  . pravastatin (PRAVACHOL) 20 MG tablet Take 1 tablet (20 mg total) by mouth daily.  [name] rOPINIRole (REQUIP) 4 MG tablet TAKE 1 TO 1 & 1/2 TABLETS BY MOUTH AT BEDTIME.  . sodium chloride (OCEAN) 0.65 % SOLN nasal spray Place 1-2 sprays into both nostrils daily as needed for congestion.  . Testosterone Cypionate & Prop 200-20 MG/ML SOLN Inject 1 mL into the muscle every 14 (fourteen) days.  [name] triamterene-hydrochlorothiazide (MAXZIDE-25) 37.5-25 MG tablet Take 1 tablet by mouth daily.  . VENTOLIN HFA 108 (90 BASE) MCG/ACT inhaler Inhale 1 puff into the lungs every 4 (four) hours as needed for wheezing or shortness of breath.   . Vitamin D, Ergocalciferol, (DRISDOL) 50000 units CAPS capsule TAKE 1 CAPSULE BY MOUTH ONCE A WEEK.  . [DISCONTINUED] levothyroxine (SYNTHROID, LEVOTHROID) 112 MCG tablet TAKE (1) TABLET BY MOUTH EACH MORNING.   No facility-administered encounter medications on file as of 03/10/2015.   ALLERGIES: Allergies  Allergen Reactions  . Cardizem [Diltiazem Hcl]     Edema   . Cardura [Doxazosin Mesylate]     Side effects  . Lipitor [Atorvastatin] Other (See Comments)    Leg cramps  . Paxil [Paroxetine Hcl]     Side effects   . Adhesive [Tape] Rash  . Codeine Rash and Other (See Comments)    Headache    VACCINATION  STATUS: Immunization History  Administered Date(s) Administered  . Influenza Split 09/18/2012  . Influenza,inj,Quad PF,36+ Mos 10/13/2013, 10/06/2014  . Pneumococcal Polysaccharide-23 10/02/2004, 10/03/2011  . Td 05/24/2006    Diabetes He presents for his follow-up diabetic visit. He has type 2 diabetes mellitus. Onset time: Was diagnosed at approximate age of 88 years. His disease course has been improving. There are no hypoglycemic associated symptoms.  Pertinent negatives for hypoglycemia include no confusion, headaches, pallor or seizures. Associated symptoms include polydipsia and polyuria. Pertinent negatives for diabetes include no chest pain, no fatigue, no polyphagia and no weakness. There are no hypoglycemic complications. Symptoms are improving. Diabetic complications include heart disease. Risk factors for coronary artery disease include diabetes mellitus, dyslipidemia, male sex, obesity, sedentary lifestyle and tobacco exposure. Current diabetic treatment includes intensive insulin program. He is compliant with treatment most of the time. His weight is stable. He is following a generally unhealthy diet. He has had a previous visit with a dietitian. He never participates in exercise. His home blood glucose trend is fluctuating minimally. His breakfast blood glucose range is generally >200 mg/dl. His lunch blood glucose range is generally >200 mg/dl. His dinner blood glucose range is generally >200 mg/dl. His overall blood glucose range is >200 mg/dl. An ACE inhibitor/angiotensin II receptor blocker is being taken. He sees a podiatrist.Eye exam is current.  Hyperlipidemia This is a chronic problem. The current episode started more than 1 year ago. Pertinent negatives include no chest pain, myalgias or shortness of breath. Current antihyperlipidemic treatment includes statins. Risk factors for coronary artery disease include dyslipidemia, diabetes mellitus, hypertension, male sex, obesity and a  sedentary lifestyle.  Hypertension This is a chronic problem. The current episode started more than 1 year ago. Pertinent negatives include no chest pain, headaches, neck pain, palpitations or shortness of breath. Past treatments include angiotensin blockers. Hypertensive end-organ damage includes CAD/MI and a thyroid problem.  Thyroid Problem Presents for follow-up visit. Patient reports no constipation, diarrhea, fatigue or palpitations. The symptoms have been improving. Past treatments include levothyroxine. His past medical history is significant for hyperlipidemia.     Review of Systems  Constitutional: Negative for fatigue and unexpected weight change.  HENT: Negative for dental problem, mouth sores and trouble swallowing.   Eyes: Negative for visual disturbance.  Respiratory: Negative for cough, choking, chest tightness, shortness of breath and wheezing.   Cardiovascular: Negative for chest pain, palpitations and leg swelling.  Gastrointestinal: Negative for nausea, vomiting, abdominal pain, diarrhea, constipation and abdominal distention.  Endocrine: Positive for polydipsia and polyuria. Negative for polyphagia.  Genitourinary: Negative for dysuria, urgency, hematuria and flank pain.  Musculoskeletal: Negative for myalgias, back pain, gait problem and neck pain.  Skin: Negative for pallor, rash and wound.  Neurological: Negative for seizures, syncope, weakness, numbness and headaches.  Psychiatric/Behavioral: Negative.  Negative for confusion and dysphoric mood.    Objective:    BP 140/84 mmHg  Pulse 75  Ht 5\' 5"  (1.651 m)  Wt 212 lb (96.163 kg)  BMI 35.28 kg/m2  SpO2 96%  Wt Readings from Last 3 Encounters:  03/10/15 212 lb (96.163 kg)  02/26/15 213 lb (96.616 kg)  02/01/15 222 lb (100.699 kg)    Physical Exam  Constitutional: He is oriented to person, place, and time. He appears well-developed and well-nourished. He is cooperative. No distress.  HENT:  Head:  Normocephalic and atraumatic.  Eyes: EOM are normal.  Neck: Normal range of motion. Neck supple. No tracheal deviation present. No thyromegaly present.  Cardiovascular: Normal rate, S1 normal, S2 normal and normal heart sounds.  Exam reveals no gallop.   No murmur heard. Pulses:      Dorsalis pedis pulses are 1+ on the right side, and 1+ on the left side.       Posterior tibial pulses are 1+ on the right side, and 1+ on the left side.  Pulmonary/Chest: Breath sounds normal.  No respiratory distress. He has no wheezes.  Abdominal: Soft. Bowel sounds are normal. He exhibits no distension. There is no tenderness. There is no guarding and no CVA tenderness.  Musculoskeletal: He exhibits no edema.       Right shoulder: He exhibits no swelling and no deformity.  Neurological: He is alert and oriented to person, place, and time. He has normal strength and normal reflexes. No cranial nerve deficit or sensory deficit. Gait normal.  Skin: Skin is warm and dry. No rash noted. No cyanosis. Nails show no clubbing.  Psychiatric: He has a normal mood and affect. His speech is normal and behavior is normal. Judgment and thought content normal. Cognition and memory are normal.    Results for orders placed or performed in visit on [id number]  Basic metabolic panel  Result Value Ref Range   Sodium 139 135 - 146 mmol/L   Potassium 4.9 3.5 - 5.3 mmol/L   Chloride 103 98 - 110 mmol/L   CO2 27 20 - 31 mmol/L   Glucose, Bld 106 (H) 65 - 99 mg/dL   BUN 27 (H) 7 - 25 mg/dL   Creat 1.21 0.70 - 1.25 mg/dL   Calcium 9.5 8.6 - 10.3 mg/dL  TSH  Result Value Ref Range   TSH 1.29 0.40 - 4.50 mIU/L  T4, free  Result Value Ref Range   Free T4 1.2 0.8 - 1.8 ng/dL  Hemoglobin A1c  Result Value Ref Range   Hgb A1c MFr Bld 9.2 (H) <5.7 %   Mean Plasma Glucose 217 (H) <117 mg/dL   Diabetic Labs (most recent): Lab Results  Component Value Date   HGBA1C 9.2* 02/22/2015   HGBA1C 8.0 02/01/2015   HGBA1C 9.1* 11/18/2014    Lipid profile (most recent): Lab Results  Component Value Date   TRIG 96 02/22/2015   CHOL 176 02/22/2015     Assessment & Plan:   1. Uncontrolled type 2 diabetes mellitus with other circulatory complication, with long-term current use of insulin (HCC)   -His diabetes is  complicated by ordinary artery disease and patient remains at a high risk for more acute and chronic complications of diabetes which include CAD, CVA, CKD, retinopathy, and neuropathy. These are all discussed in detail with the patient.  Patient came with elevated glucose profile despite large dose of insulin, and  recent A1c increasing to 9.2% . This is largely due to his dietary indiscretion.  Glucose logs and insulin administration records pertaining to this visit,  to be scanned into patient's records.  Recent labs reviewed.   - I have re-counseled the patient on diet management and weight loss  by adopting a carbohydrate restricted / protein rich  Diet.  - Suggestion is made for patient to avoid simple carbohydrates   from their diet including Cakes , Desserts, Ice Cream,  Soda (  diet and regular) , Sweet Tea , Candies,  Chips, Cookies, Artificial Sweeteners,   and "Sugar-free" Products .  This will help patient to have stable blood glucose profile and potentially avoid unintended  Weight gain.  - Patient is advised to stick to a routine mealtimes to eat 3 meals  a day and avoid unnecessary snacks ( to snack only to correct hypoglycemia).  - The patient  has been  scheduled with Jearld Fenton, RDN, CDE for individualized DM education.  - I have approached patient with the following individualized plan to manage diabetes and patient agrees.  -Continue Levemir 100 units daily at bedtime ,  and Humalog u200  25 units TIDAC plus SSI associated with monitoring of BG ac and hs.   -Patient is encouraged to call clinic for blood glucose levels less than 70 or above 300 mg /dl. - I will continue low-dose metformin  500 mg by mouth twice a day. -I gave him brochures and information on DEXCOM. - Patient will be considered for incretin therapy as appropriate next visit. - Patient specific target  for A1c; LDL, HDL, Triglycerides, and  Waist Circumference were discussed in detail.  2) BP/HTN: Controlled. Continue current medications including ACEI/ARB. 3) Lipids/HPL:  continue statins. 4)  Weight/Diet: CDE consult in progress, exercise, and carbohydrates information provided. 5)Other specified hypothyroidism - Increase levothyroxine to 125 g by mouth daily before breakfast.  - We discussed about correct intake of levothyroxine, at fasting, with water, separated by at least 30 minutes from breakfast, and separated by more than 4 hours from calcium, iron, multivitamins, acid reflux medications (PPIs). -Patient is made aware of the fact that thyroid hormone replacement is needed for life, dose to be adjusted by periodic monitoring of thyroid function tests.  6) Chronic Care/Health Maintenance:  -Patient is on ACEI/ARB and Statin medications and encouraged to continue to follow up with Ophthalmology, Podiatrist at least yearly or according to recommendations, and advised to  stay away from smoking. I have recommended yearly flu vaccine and pneumonia vaccination at least every 5 years; moderate intensity exercise for up to 150 minutes weekly; and  sleep for at least 7 hours a day.  - 25 minutes of time was spent on the care of this patient , 50% of which was applied for counseling on diabetes complications and their preventions.  - I advised patient to maintain close follow up with [name], MD for primary care needs.  Patient is asked to bring meter and  blood glucose logs during their next visit.   Follow up plan: -Return in about 3 months (around 06/10/2015) for diabetes, high blood pressure, high cholesterol, underactive thyroid, follow up with pre-visit labs, meter, and logs.  Glade Lloyd, MD Phone:  [phone]  Fax: [phone]   03/10/2015, 11:02 AM

## 2015-03-10 NOTE — Patient Instructions (Signed)

## 2015-03-24 ENCOUNTER — Other Ambulatory Visit: Payer: Self-pay | Admitting: Neurology

## 2015-03-25 DIAGNOSIS — E291 Testicular hypofunction: Secondary | ICD-10-CM | POA: Diagnosis not present

## 2015-04-06 ENCOUNTER — Other Ambulatory Visit: Payer: Self-pay | Admitting: "Endocrinology

## 2015-04-08 DIAGNOSIS — E291 Testicular hypofunction: Secondary | ICD-10-CM | POA: Diagnosis not present

## 2015-04-12 ENCOUNTER — Encounter: Payer: Self-pay | Admitting: Family Medicine

## 2015-04-12 ENCOUNTER — Ambulatory Visit (INDEPENDENT_AMBULATORY_CARE_PROVIDER_SITE_OTHER): Payer: Medicare Other | Admitting: Family Medicine

## 2015-04-12 ENCOUNTER — Other Ambulatory Visit: Payer: Self-pay | Admitting: "Endocrinology

## 2015-04-12 VITALS — BP 142/86 | Temp 99.3°F | Ht 65.0 in | Wt 215.0 lb

## 2015-04-12 DIAGNOSIS — J019 Acute sinusitis, unspecified: Secondary | ICD-10-CM | POA: Diagnosis not present

## 2015-04-12 DIAGNOSIS — J4520 Mild intermittent asthma, uncomplicated: Secondary | ICD-10-CM | POA: Diagnosis not present

## 2015-04-12 DIAGNOSIS — I25119 Atherosclerotic heart disease of native coronary artery with unspecified angina pectoris: Secondary | ICD-10-CM

## 2015-04-12 DIAGNOSIS — B9689 Other specified bacterial agents as the cause of diseases classified elsewhere: Secondary | ICD-10-CM

## 2015-04-12 MED ORDER — HYDROCODONE-HOMATROPINE 5-1.5 MG/5ML PO SYRP: 5.0000 mL | ORAL_SOLUTION | Freq: Every evening | ORAL | Status: DC | PRN

## 2015-04-12 MED ORDER — LEVOFLOXACIN 500 MG PO TABS: 500.0000 mg | ORAL_TABLET | Freq: Every day | ORAL | Status: DC

## 2015-04-12 MED ORDER — ALBUTEROL SULFATE HFA 108 (90 BASE) MCG/ACT IN AERS: 2.0000 | INHALATION_SPRAY | Freq: Four times a day (QID) | RESPIRATORY_TRACT | Status: DC | PRN

## 2015-04-12 MED ORDER — PREDNISONE 20 MG PO TABS: ORAL_TABLET | ORAL | Status: DC

## 2015-04-12 NOTE — Progress Notes (Signed)
   Subjective:    Patient ID: Paul Baker, male    DOB: July 09, 1950, 65 y.o.   MRN: [id number]  Cough This is a new problem. Episode onset: one week. Associated symptoms include ear pain, a fever, headaches, nasal congestion, a sore throat and wheezing. Treatments tried: delsym, tylenol.   Took tamiflu and abx about four wks ago  Got beter  Then developed some cough wih allergy and cough and tight  And productive  Frontal headache also Using inhaler out for now  Actually had run out of inhaler but felt he definitely needed at  Review of Systems  Constitutional: Positive for fever.  HENT: Positive for ear pain and sore throat.   Respiratory: Positive for cough and wheezing.   Neurological: Positive for headaches.       Objective:   Physical Exam Alert slight malaise vital stable HEENT some sinus congestion tenderness pharynx normal lungs bilateral wheezes bronchial cough evident no crackles no tachypnea heart regular in rhythm       Assessment & Plan:  Impression post flu rhinosinusitis/bronchitis with element of flare of asthma plan prednisone taper. Hycodan daily at bedtime. Levaquin daily 10 days symptom care discussed W[facility]

## 2015-04-20 ENCOUNTER — Other Ambulatory Visit: Payer: Self-pay | Admitting: Neurology

## 2015-04-20 ENCOUNTER — Other Ambulatory Visit: Payer: Self-pay | Admitting: "Endocrinology

## 2015-04-29 DIAGNOSIS — E291 Testicular hypofunction: Secondary | ICD-10-CM | POA: Diagnosis not present

## 2015-05-12 ENCOUNTER — Ambulatory Visit (INDEPENDENT_AMBULATORY_CARE_PROVIDER_SITE_OTHER): Payer: Medicare Other | Admitting: Adult Health

## 2015-05-12 ENCOUNTER — Telehealth: Payer: Self-pay | Admitting: *Deleted

## 2015-05-12 ENCOUNTER — Encounter: Payer: Self-pay | Admitting: Adult Health

## 2015-05-12 VITALS — BP 123/73 | HR 70 | Resp 18 | Ht 65.0 in | Wt 210.6 lb

## 2015-05-12 DIAGNOSIS — G4733 Obstructive sleep apnea (adult) (pediatric): Secondary | ICD-10-CM

## 2015-05-12 DIAGNOSIS — Z9989 Dependence on other enabling machines and devices: Secondary | ICD-10-CM

## 2015-05-12 DIAGNOSIS — I25119 Atherosclerotic heart disease of native coronary artery with unspecified angina pectoris: Secondary | ICD-10-CM

## 2015-05-12 DIAGNOSIS — G2581 Restless legs syndrome: Secondary | ICD-10-CM

## 2015-05-12 MED ORDER — ROPINIROLE HCL 4 MG PO TABS: ORAL_TABLET | ORAL | Status: DC

## 2015-05-12 NOTE — Progress Notes (Signed)
PATIENT: Paul Baker DOB: [birth year]  REASON FOR VISIT: follow up- obstructive sleep apnea on CPAP, restless leg symptoms HISTORY FROM: patient  HISTORY OF PRESENT ILLNESS:  Today 05/12/2015 : Paul Baker is a 65 year old male with a history of obstructive sleep apnea on CPAP and restless leg syndrome. He returns today for follow-up. His download indicates that he uses machine 23 out of 30 days for compliance of 77%. He uses machine greater than 4 hours 20 out of 30 days for compliance of 67%. On average he is using his machine 6 hours and 1 minute. His residual AHI is 1.8 on a minimum pressure of 6 cm of water and maximum pressure of 16 cm of water. He does not have a significant leak. He states that he was unable to use the machine for almost a week due to having the flu. The patient's main concern today is his restless leg symptoms. He is currently on Requip and Klonopin. In the past he's tried baclofen, Neurontin, Mirapex and the neupro patch. At the last visit he was given a prescription of Dilaudid. He states that this offers him no benefit either. He continues to have a hard time sleeping at night due to these symptoms. He returns today for an evaluation.  HISTORY [facility]): download from 09-08-13 through 10-07-13 revealed an 11.5 cm water pressure need, average daily used to time 4 hours 41 minutes, the patient used the machine on 27 of 30 days. The patient used the machine 4 hours on 20 of 30 days. Compliance over 4 hours or 66%. Residual AHI of 0.5. This patient with severe obstructive sleep apnea followed by Dr. Krista Blue. AHI 62 titrated to 16 cm water. DME was [employer] , PCP Dr.-Luking.  Apnea cannot longer be the cause for his excessive daytime sleepiness, I will try to adjust his medication treatment for restless leg syndrome.   Interval history from 11-12-14 Paul Baker returns today for compliance visit on CPAP but also reports worsening of his restless leg  syndrome. He has been 77% compliant with his CPAP use uses it on average 4 hours and 35 minutes. He is using an AutoSet between 5 and 16 cm water pressure, with a full-time expiratory pressure relief of 3 cm water. His AHI is 3.06 for flying a very good control of his previously severe obstructive sleep apnea. Sleep apnea is not the cause of his excessive daytime sleepiness anymore. His main problem is his restless legs which have been uncontrollable. He has tried to [employer], he has also tried Nuvigil in daytime to combat the daytime sleepiness. He states that the Provigil does not work as well as any longer. He had tried Requip and Mirapex in oral form. At this time he is fairly desperate. The restless limbs as I called him now has expanded to the upper extremities. He had been evaluated for iron deficiency, low ferritin, spinal stenosis peripheral vascular disease and neuropathy. He likely has an inherited form.    He reports for the interval medical history that he was told his renal clearance has decreased, he was also hospitalized for a right knee replacement but in the meantime acquired a patella tendon tear. Left knee had been replaced in 2005. Dr. Vanetta Mulders is following him for his chronic kidney dysfunction. Today's Epworth sleepiness score is endorsed at 20 points, fatigue severity at 37 points and the geriatric depression score at 4 points. The [facility] restless leg syndrome quality of life questionnaire is  endorsed at moderate to severe impairment. His RLS 6 rating scale is at the very severe impairment rating.    REVIEW OF SYSTEMS: Out of a complete 14 system review of symptoms, the patient complains only of the following symptoms, and all other reviewed systems are negative.  Eye itching, eye redness, blurred vision, cough, wheezing, shortness of breath, hearing loss, ear pain, ringing in ears, runny nose, chest pain, leg swelling, restless leg, insomnia, apnea,  frequent waking, daytime sleepiness, snoring, sleepwalking, joint pain, joint swelling, back pain, aching muscles, muscle cramps, walking difficulty, neck pain, neck stiffness, rash moles itching, nervous/anxious, memory loss, dizziness, headache, numbness  ALLERGIES: Allergies  Allergen Reactions  . Cardizem [Diltiazem Hcl]     Edema   . Cardura [Doxazosin Mesylate]     Side effects  . Lipitor [Atorvastatin] Other (See Comments)    Leg cramps  . Paxil [Paroxetine Hcl]     Side effects   . Adhesive [Tape] Rash  . Codeine Rash and Other (See Comments)    Headache     HOME MEDICATIONS: Outpatient Prescriptions Prior to Visit  Medication Sig Dispense Refill  . albuterol (VENTOLIN HFA) 108 (90 Base) MCG/ACT inhaler Inhale 2 puffs into the lungs 4 (four) times daily as needed for wheezing or shortness of breath. 18 g 0  . amLODipine (NORVASC) 5 MG tablet TAKE ONE TABLET BY MOUTH ONCE DAILY. 30 tablet 6  . Armodafinil (NUVIGIL) 200 MG TABS Take 1 tablet by mouth daily.    . B-D ULTRAFINE III SHORT PEN 31G X 8 MM MISC USING FIVE TIMES DAILY. 100 each 5  . clobetasol (TEMOVATE) 0.05 % external solution Apply 1 application topically daily as needed (irritation.).     [name] clonazePAM (KLONOPIN) 1 MG tablet TAKE (1) TABLET BY MOUTH AT BEDTIME FOR RESTLESS LEGS. 30 tablet 5  . fenofibrate 160 MG tablet TAKE (1) TABLET BY MOUTH AT BEDTIME. 30 tablet 5  . HUMALOG KWIKPEN 200 UNIT/ML SOPN INJECT UP TO 31 UNITS THREE TIMES DAILY WITH MEALS AS DIRECTED. 12 mL 2  . isosorbide mononitrate (IMDUR) 60 MG 24 hr tablet TAKE (1/2) TABLET BY MOUTH TWICE DAILY. 60 tablet 2  . LEVEMIR FLEXTOUCH 100 UNIT/ML Pen INJECT 50 UNITS TWO TIMES DAILY. (Patient taking differently: 100units at bedtime) 30 mL 2  . levothyroxine (SYNTHROID, LEVOTHROID) 125 MCG tablet Take 1 tablet (125 mcg total) by mouth daily before breakfast. 30 tablet 2  . loratadine (CLARITIN) 10 MG tablet Take 10 mg by mouth at bedtime as needed for  allergies.     [name] losartan (COZAAR) 50 MG tablet Take 50 mg by mouth at bedtime.    . metFORMIN (GLUCOPHAGE) 500 MG tablet Take 500 mg by mouth 2 (two) times daily with a meal.    . metoprolol succinate (TOPROL-XL) 50 MG 24 hr tablet TAKE (1) TABLET BY MOUTH TWICE DAILY. 60 tablet 5  . nitroGLYCERIN (NITROSTAT) 0.4 MG SL tablet DISSOLVE 1 TABLET UNDER TONGUE EVERY 5 MINUTES UP TO 15 MIN FOR CHESTPAIN. IF NO RELIEF CALL 911. 25 tablet 3  . ONE TOUCH ULTRA TEST test strip 1 each by Other route. Four to five times daily.    [name] DELICA LANCETS [id number] MISC USE AS DIRECTED TO TEST BLOOD SUGAR 4 TIMES DAILY. 150 each 5  . polyvinyl alcohol (LIQUIFILM TEARS) 1.4 % ophthalmic solution Place 1 drop into both eyes daily as needed for dry eyes.    . pravastatin (PRAVACHOL) 20 MG tablet Take 1 tablet (  20 mg total) by mouth daily. 30 tablet 5  . sodium chloride (OCEAN) 0.65 % SOLN nasal spray Place 1-2 sprays into both nostrils daily as needed for congestion.    . Testosterone Cypionate & Prop 200-20 MG/ML SOLN Inject 1 mL into the muscle every 14 (fourteen) days.    [name] triamterene-hydrochlorothiazide (MAXZIDE-25) 37.5-25 MG tablet Take 1 tablet by mouth daily. 30 tablet 5  . Vitamin D, Ergocalciferol, (DRISDOL) 50000 units CAPS capsule TAKE 1 CAPSULE BY MOUTH ONCE A WEEK. 12 capsule 0  . rOPINIRole (REQUIP) 4 MG tablet TAKE 1 TO 1 & 1/2 TABLETS BY MOUTH AT BEDTIME. 45 tablet 0  . HYDROcodone-homatropine (HYCODAN) 5-1.5 MG/5ML syrup Take 5 mLs by mouth at bedtime as needed for cough. (Patient not taking: Reported on 05/12/2015) 90 mL 0  . levofloxacin (LEVAQUIN) 500 MG tablet Take 1 tablet (500 mg total) by mouth daily. X 10 days (Patient not taking: Reported on 05/12/2015) 10 tablet 0  . predniSONE (DELTASONE) 20 MG tablet Take 3 tabs qd x 3 days, 2 tabs qd x 3 days then 1 tab qd x 2 days (Patient not taking: Reported on 05/12/2015) 17 tablet 0   No facility-administered medications prior to visit.    PAST  MEDICAL HISTORY: Past Medical History  Diagnosis Date  . Arthritis   . Type 2 diabetes mellitus ([place])   . Mixed hyperlipidemia   . Essential hypertension, benign   . Septic arthritis of knee, left ([place])   . OSA (obstructive sleep apnea)   . Coronary atherosclerosis of native coronary artery     Diseased nondominant RCA  . Pulmonary embolism ([place]) 2004  . DVT (deep venous thrombosis) ([place]) 2005    Right arm  . Rotator cuff disorder     Left  . Colon polyp   . Morbid obesity ([place])   . RLS (restless legs syndrome)   . Hypersomnia     CPAP of 16 cm, diagnosed with AHI of 60 in 2012,epworth 21- narcolepsy?  [name] MRSA (methicillin resistant staph aureus) culture positive     08/2012  . Narcolepsy   . Asthma   . Headache   . Skin cancer, basal cell   . Psoriasis   . History of transfusion   . Hypothyroidism   . Pneumonia     PAST SURGICAL HISTORY: Past Surgical History  Procedure Laterality Date  . Total knee arthroplasty  2003    LEFT  . Lumbar disc surgery      Left L3, L4, L5 discecotomy with decompression of L4 root  . Wrist surgery    . Total knee revision  2005    LEFT  . Colonoscopy    . Back surgery    . Knee surgery      X 25 TIMES  . Total knee arthroplasty Right 03/23/2014    Procedure: RIGHT TOTAL KNEE ARTHROPLASTY AND REMOVAL RIGHT TIBIAL  DEEP IMPLANT STAPLE;  Surgeon: Paralee Cancel, MD;  Location: [facility];  Service: Orthopedics;  Laterality: Right;  . Tonsillectomy    . Hip surgery      bone removed from both sides of hip  . Eye surgery Left 2016    laser to left eye  . Cataract extraction Bilateral   . Knee arthrotomy Right 12/04/2014    Procedure: KNEE ARTHROTOMY PATELLA LIGAMENT RECONSTRUSION AND REPAIR RIGHT KNEE;  Surgeon: Paralee Cancel, MD;  Location: [place];  Service: Orthopedics;  Laterality: Right;    FAMILY HISTORY: Family History  Problem  Relation Age of Onset  . Hypertension Father   . Heart attack Father   . Kidney Stones Father   . Diabetes  Sister   . Seizures Grandchild   . Narcolepsy Grandchild     SOCIAL HISTORY: Social History   Social History  . Marital Status: Married    Spouse Name: Toney Reil  . Number of Children: 2  . Years of Education: college   Occupational History  . Disabled   .     Social History Main Topics  . Smoking status: Former Smoker    Types: Cigarettes    Start date: 06/19/1967    Quit date: 01/03/1995  . Smokeless tobacco: Never Used  . Alcohol Use: No     Comment: quit drinking in 07/86  . Drug Use: No  . Sexual Activity:    Partners: Female   Other Topics Concern  . Not on file   Social History Narrative    65 year old, right-handed, caucasian male with a past medical history of obesity, hypertension, hyperlipidemia, diabetes, obstructive sleep apnea, presenting with frequent nighttime awakenings, excessive daytime sleepiness, also transient confusional episodes.RLS and one beosity, OSA on CPAP with AHI of 3.2 and  setting of 16 cm water , [facility] .      PHYSICAL EXAM  Filed Vitals:   05/12/15 0930  BP: 123/73  Pulse: 70  Resp: 18  Height: 5\' 5"  (1.651 m)  Weight: 210 lb 9.6 oz (95.528 kg)   Body mass index is 35.05 kg/(m^2).  Generalized: Well developed, in no acute distress   Neurological examination  Mentation: Alert oriented to time, place, history taking. Follows all commands speech and language fluent Cranial nerve II-XII: Pupils were equal round reactive to light. Extraocular movements were full, visual field were full on confrontational test. Facial sensation and strength were normal. Uvula tongue midline. Head turning and shoulder shrug  were normal and symmetric. Motor: The motor testing reveals 5 over 5 strength of all 4 extremities. Good symmetric motor tone is noted throughout.  Sensory: Sensory testing is intact to soft touch on all 4 extremities. No evidence of extinction is noted.  Coordination: Cerebellar testing reveals good finger-nose-finger and  heel-to-shin bilaterally.  Gait and station: Gait is normal. Tandem gait is normal. Romberg is negative. No drift is seen.  Reflexes: Deep tendon reflexes are symmetric and normal bilaterally.   DIAGNOSTIC DATA (LABS, IMAGING, TESTING) - I reviewed patient records, labs, notes, testing and imaging myself where available.  Lab Results  Component Value Date   WBC 8.2 12/05/2014   HGB 12.7* 12/05/2014   HCT 40.5 12/05/2014   MCV 91.6 12/05/2014   PLT 197 12/05/2014      Component Value Date/Time   NA 139 02/22/2015 0913   NA 141 10/08/2013 1032   K 4.9 02/22/2015 0913   CL 103 02/22/2015 0913   CO2 27 02/22/2015 0913   GLUCOSE 106* 02/22/2015 0913   GLUCOSE 204* 10/08/2013 1032   BUN 27* 02/22/2015 0913   BUN 28* 10/08/2013 1032   CREATININE 1.21 02/22/2015 0913   CREATININE 1.42* 12/05/2014 0451   CALCIUM 9.5 02/22/2015 0913   PROT 6.4 02/22/2015 0938   PROT 7.6 05/13/2013 1121   ALBUMIN 4.3 02/22/2015 0938   ALBUMIN 3.9 05/13/2013 1121   AST 16 02/22/2015 0938   ALT 13 02/22/2015 0938   ALKPHOS 42 02/22/2015 0938   BILITOT 0.2 02/22/2015 0938   BILITOT <0.2 10/08/2013 1032   GFRNONAA 51* 12/05/2014 0451   GFRAA  59* 12/05/2014 0451   Lab Results  Component Value Date   CHOL 176 02/22/2015   HDL 52 02/22/2015   LDLCALC 105* 02/22/2015   TRIG 96 02/22/2015   CHOLHDL 3.4 02/22/2015   Lab Results  Component Value Date   HGBA1C 9.2* 02/22/2015    Lab Results  Component Value Date   TSH 1.29 02/22/2015      ASSESSMENT AND PLAN 65 y.o. year old male  has a past medical history of Arthritis; Type 2 diabetes mellitus ([place]); Mixed hyperlipidemia; Essential hypertension, benign; Septic arthritis of knee, left ([facility]); OSA (obstructive sleep apnea); Coronary atherosclerosis of native coronary artery; Pulmonary embolism ([place]) (2004); DVT (deep venous thrombosis) ([place]) (2005); Rotator cuff disorder; Colon polyp; Morbid obesity ([place]); RLS (restless legs syndrome);  Hypersomnia; MRSA (methicillin resistant staph aureus) culture positive; Narcolepsy; Asthma; Headache; Skin cancer, basal cell; Psoriasis; History of transfusion; Hypothyroidism; and Pneumonia. here with:  1. Obstructive sleep apnea on CPAP 2. Restless leg symptoms  The patient will continue using the CPAP nightly. He will continue on Requip and Klonopin for restless legs. I will give him a prescription for the [employer] leg wraps for restless leg. I will also put a referral into [facility] medical with Dr. Dwyane Dee for a second opinion in regards to his restless leg symptoms and treatment thereof. Patient is amenable to this plan. He will follow-up in 6 months or sooner if needed.     Ward Givens, MSN, NP-C 05/12/2015, 11:52 AM [facility] [address], [place], [name] [address] [phone]

## 2015-05-12 NOTE — Patient Instructions (Signed)
Continue CPAP nightly [facility] to Dr. Dwyane Dee for RLS If your symptoms worsen or you develop new symptoms please let us know.

## 2015-05-12 NOTE — Telephone Encounter (Signed)
Received fax confirmation to DME- restiffic leg wraps  [facility].

## 2015-05-12 NOTE — Progress Notes (Signed)
I agree with the assessment and plan as directed by NP .The patient is known to me .   Henri Baumler, MD  

## 2015-05-13 DIAGNOSIS — E291 Testicular hypofunction: Secondary | ICD-10-CM | POA: Diagnosis not present

## 2015-05-13 NOTE — Telephone Encounter (Signed)
Adrienne/[facility] [phone] called said they do not carry leg wraps

## 2015-05-13 NOTE — Telephone Encounter (Signed)
Noted.  Red faxed to [employer].

## 2015-05-21 ENCOUNTER — Other Ambulatory Visit: Payer: Self-pay | Admitting: Cardiology

## 2015-05-21 ENCOUNTER — Other Ambulatory Visit: Payer: Self-pay | Admitting: Neurology

## 2015-05-24 NOTE — Telephone Encounter (Signed)
Called pt to discuss nuvigil refill. Nuvigil is not an active medication on his list being prescribed by Dr. Brett Fairy. No answer, left a message asking him to call me back.

## 2015-05-25 ENCOUNTER — Telehealth: Payer: Self-pay | Admitting: *Deleted

## 2015-05-25 NOTE — Telephone Encounter (Signed)
Message For: OFFICE               Taken 23-MAY-17 at  9:33AM by [facility] ------------------------------------------------------------ Paul Baker           CID [id number]  Patient SAME                 Pt's Dr [facility]      Area Code [id number] Phone# [phone] * DOB [phone]     RE RETURNED [place]                                                                                     Disp:Y/N Y If Y = C/B If No Response In 30minutes ============================================================

## 2015-05-25 NOTE — Telephone Encounter (Signed)
RX for nuvigil approved by Dr. Brett Fairy. Faxed RX to [facility]. Received a receipt of confirmation.

## 2015-05-25 NOTE — Telephone Encounter (Signed)
Spoke to pt. He is still currently take the nuvigil 200mg . He is unsure of why it is not on his list of medications. Will send to Dr. Brett Fairy to review.

## 2015-05-27 DIAGNOSIS — E291 Testicular hypofunction: Secondary | ICD-10-CM | POA: Diagnosis not present

## 2015-06-01 DIAGNOSIS — E291 Testicular hypofunction: Secondary | ICD-10-CM | POA: Diagnosis not present

## 2015-06-08 DIAGNOSIS — E291 Testicular hypofunction: Secondary | ICD-10-CM | POA: Diagnosis not present

## 2015-06-09 ENCOUNTER — Other Ambulatory Visit: Payer: Self-pay | Admitting: Family Medicine

## 2015-06-09 ENCOUNTER — Other Ambulatory Visit: Payer: Self-pay | Admitting: "Endocrinology

## 2015-06-10 DIAGNOSIS — E291 Testicular hypofunction: Secondary | ICD-10-CM | POA: Diagnosis not present

## 2015-06-18 ENCOUNTER — Encounter: Payer: Self-pay | Admitting: Nurse Practitioner

## 2015-06-18 ENCOUNTER — Ambulatory Visit (INDEPENDENT_AMBULATORY_CARE_PROVIDER_SITE_OTHER): Payer: Medicare Other | Admitting: Nurse Practitioner

## 2015-06-18 VITALS — BP 126/74 | Temp 98.3°F | Ht 65.0 in | Wt 209.1 lb

## 2015-06-18 DIAGNOSIS — I25119 Atherosclerotic heart disease of native coronary artery with unspecified angina pectoris: Secondary | ICD-10-CM

## 2015-06-18 DIAGNOSIS — H109 Unspecified conjunctivitis: Secondary | ICD-10-CM | POA: Diagnosis not present

## 2015-06-18 MED ORDER — SULFACETAMIDE SODIUM 10 % OP SOLN: 1.0000 [drp] | OPHTHALMIC | Status: DC

## 2015-06-18 NOTE — Progress Notes (Signed)
Subjective:  Presents with c/o burning and redness in both eyes that began about a week ago. Minimal blurred vision. No actual eye pain. Increased tearing with drainage. His wife and grandson have similar symptoms. His wife is on antibiotic eye drops.  Objective:   BP 126/74 mmHg  Temp(Src) 98.3 F (36.8 C) (Oral)  Ht 5\' 5"  (1.651 m)  Wt 209 lb 2 oz (94.858 kg)  BMI 34.80 kg/m2 NAD. Alert, oriented. Conjunctivae mildly injected bilat. Positive right preauricular lymph node. Lungs clear. Heart RRR.   Assessment: Bilateral conjunctivitis  Plan:  Meds ordered this encounter  Medications  . sulfacetamide (BLEPH-10) 10 % ophthalmic solution    Sig: Place 1 drop into both eyes every 2 (two) hours while awake. Then QID starting tomorrow    Dispense:  10 mL    Refill:  0    Order Specific Question:  Supervising Provider    Answer:  Maggie Font   Explained that this may be a viral infection that should resolve over time. Call back if worsens or persists.

## 2015-06-19 ENCOUNTER — Other Ambulatory Visit: Payer: Self-pay | Admitting: Neurology

## 2015-06-19 ENCOUNTER — Other Ambulatory Visit: Payer: Self-pay | Admitting: "Endocrinology

## 2015-06-21 ENCOUNTER — Ambulatory Visit: Payer: Medicare Other | Admitting: "Endocrinology

## 2015-06-21 ENCOUNTER — Other Ambulatory Visit: Payer: Self-pay | Admitting: "Endocrinology

## 2015-06-21 DIAGNOSIS — E038 Other specified hypothyroidism: Secondary | ICD-10-CM | POA: Diagnosis not present

## 2015-06-21 DIAGNOSIS — E1159 Type 2 diabetes mellitus with other circulatory complications: Secondary | ICD-10-CM | POA: Diagnosis not present

## 2015-06-21 LAB — BASIC METABOLIC PANEL
BUN: 30 mg/dL — AB (ref 7–25)
CHLORIDE: 104 mmol/L (ref 98–110)
CO2: 27 mmol/L (ref 20–31)
Calcium: 9.2 mg/dL (ref 8.6–10.3)
Creat: 1.21 mg/dL (ref 0.70–1.25)
Glucose, Bld: 87 mg/dL (ref 65–99)
Potassium: 4.5 mmol/L (ref 3.5–5.3)
Sodium: 139 mmol/L (ref 135–146)

## 2015-06-21 LAB — T4, FREE: FREE T4: 1.1 ng/dL (ref 0.8–1.8)

## 2015-06-21 LAB — TSH: TSH: 0.62 mIU/L (ref 0.40–4.50)

## 2015-06-21 LAB — HEMOGLOBIN A1C
HEMOGLOBIN A1C: 8.8 % — AB (ref <5.7)
MEAN PLASMA GLUCOSE: 206 mg/dL

## 2015-06-21 NOTE — Telephone Encounter (Signed)
RX for klonopin faxed to [facility]. Received a receipt of confirmation.  

## 2015-06-23 ENCOUNTER — Other Ambulatory Visit: Payer: Self-pay

## 2015-06-23 ENCOUNTER — Other Ambulatory Visit: Payer: Self-pay | Admitting: Neurology

## 2015-06-23 MED ORDER — ARMODAFINIL 200 MG PO TABS: ORAL_TABLET | ORAL | Status: DC

## 2015-06-23 NOTE — Telephone Encounter (Signed)
Another physician accidentally signed the RX for armodafinil printed by Dr. Brett Fairy. I reprinted the RX and Dr. Brett Fairy signed it. First RX was shredded. RX faxed to [facility]. Received a receipt of confirmation.

## 2015-07-01 DIAGNOSIS — E291 Testicular hypofunction: Secondary | ICD-10-CM | POA: Diagnosis not present

## 2015-07-03 ENCOUNTER — Other Ambulatory Visit: Payer: Self-pay | Admitting: Family Medicine

## 2015-07-05 ENCOUNTER — Other Ambulatory Visit: Payer: Self-pay | Admitting: "Endocrinology

## 2015-07-07 ENCOUNTER — Encounter: Payer: Self-pay | Admitting: "Endocrinology

## 2015-07-07 ENCOUNTER — Ambulatory Visit (INDEPENDENT_AMBULATORY_CARE_PROVIDER_SITE_OTHER): Payer: Medicare Other | Admitting: "Endocrinology

## 2015-07-07 VITALS — BP 139/78 | HR 82 | Ht 65.0 in | Wt 210.0 lb

## 2015-07-07 DIAGNOSIS — I25119 Atherosclerotic heart disease of native coronary artery with unspecified angina pectoris: Secondary | ICD-10-CM

## 2015-07-07 DIAGNOSIS — I10 Essential (primary) hypertension: Secondary | ICD-10-CM | POA: Diagnosis not present

## 2015-07-07 DIAGNOSIS — E1159 Type 2 diabetes mellitus with other circulatory complications: Secondary | ICD-10-CM | POA: Diagnosis not present

## 2015-07-07 DIAGNOSIS — E785 Hyperlipidemia, unspecified: Secondary | ICD-10-CM | POA: Diagnosis not present

## 2015-07-07 DIAGNOSIS — E038 Other specified hypothyroidism: Secondary | ICD-10-CM

## 2015-07-07 NOTE — Progress Notes (Signed)
Subjective:    Patient ID: Paul Baker, male    DOB: 12-01-1950, PCP [name], MD   Past Medical History  Diagnosis Date  . Arthritis   . Type 2 diabetes mellitus ([place])   . Mixed hyperlipidemia   . Essential hypertension, benign   . Septic arthritis of knee, left ([place])   . OSA (obstructive sleep apnea)   . Coronary atherosclerosis of native coronary artery     Diseased nondominant RCA  . Pulmonary embolism ([place]) 2004  . DVT (deep venous thrombosis) ([place]) 2005    Right arm  . Rotator cuff disorder     Left  . Colon polyp   . Morbid obesity ([place])   . RLS (restless legs syndrome)   . Hypersomnia     CPAP of 16 cm, diagnosed with AHI of 60 in 2012,[place] 21- narcolepsy?  [name] MRSA (methicillin resistant staph aureus) culture positive     08/2012  . Narcolepsy   . Asthma   . Headache   . Skin cancer, basal cell   . Psoriasis   . History of transfusion   . Hypothyroidism   . Pneumonia    Past Surgical History  Procedure Laterality Date  . Total knee arthroplasty  2003    LEFT  . Lumbar disc surgery      Left L3, L4, L5 discecotomy with decompression of L4 root  . Wrist surgery    . Total knee revision  2005    LEFT  . Colonoscopy    . Back surgery    . Knee surgery      X 25 TIMES  . Total knee arthroplasty Right 03/23/2014    Procedure: RIGHT TOTAL KNEE ARTHROPLASTY AND REMOVAL RIGHT TIBIAL  DEEP IMPLANT STAPLE;  Surgeon: Paralee Cancel, MD;  Location: [facility];  Service: Orthopedics;  Laterality: Right;  . Tonsillectomy    . Hip surgery      bone removed from both sides of hip  . Eye surgery Left 2016    laser to left eye  . Cataract extraction Bilateral   . Knee arthrotomy Right 12/04/2014    Procedure: KNEE ARTHROTOMY PATELLA LIGAMENT RECONSTRUSION AND REPAIR RIGHT KNEE;  Surgeon: Paralee Cancel, MD;  Location: [facility];  Service: Orthopedics;  Laterality: Right;   Social History   Social History  . Marital Status: Married    Spouse Name: Toney Reil  .  Number of Children: 2  . Years of Education: college   Occupational History  . Disabled   .     Social History Main Topics  . Smoking status: Former Smoker    Types: Cigarettes    Start date: 06/19/1967    Quit date: 01/03/1995  . Smokeless tobacco: Never Used  . Alcohol Use: No     Comment: quit drinking in 07/86  . Drug Use: No  . Sexual Activity:    Partners: Female   Other Topics Concern  . None   Social History Narrative    65 year old, right-handed, caucasian male with a past medical history of obesity, hypertension, hyperlipidemia, diabetes, obstructive sleep apnea, presenting with frequent nighttime awakenings, excessive daytime sleepiness, also transient confusional episodes.RLS and one beosity, OSA on CPAP with AHI of 3.2 and  setting of 16 cm water , [facility] .   Outpatient Encounter Prescriptions as of 07/07/2015  Medication Sig  . albuterol (VENTOLIN HFA) 108 (90 Base) MCG/ACT inhaler Inhale 2 puffs into the lungs 4 (four) times daily as needed for  wheezing or shortness of breath.  [name] amLODipine (NORVASC) 5 MG tablet TAKE ONE TABLET BY MOUTH ONCE DAILY.  [name] Armodafinil 200 MG TABS TAKE 1 TABLET BY MOUTH EACH MORNING.  . B-D ULTRAFINE III SHORT PEN 31G X 8 MM MISC USING FIVE TIMES DAILY.  . clobetasol (TEMOVATE) 0.05 % external solution Apply 1 application topically daily as needed (irritation.).   [name] clonazePAM (KLONOPIN) 1 MG tablet TAKE (1) TABLET BY MOUTH AT BEDTIME FOR RESTLESS LEGS.  . fenofibrate 160 MG tablet TAKE (1) TABLET BY MOUTH AT BEDTIME.  [name] HUMALOG KWIKPEN 200 UNIT/ML SOPN INJECT UP TO 31 UNITS THREE TIMES DAILY WITH MEALS AS DIRECTED.  [name] isosorbide mononitrate (IMDUR) 60 MG 24 hr tablet TAKE (1/2) TABLET BY MOUTH TWICE DAILY.  [name] LEVEMIR FLEXTOUCH 100 UNIT/ML Pen INJECT 50 UNITS TWO TIMES DAILY.  [name] levothyroxine (SYNTHROID, LEVOTHROID) 125 MCG tablet TAKE (1) TABLET BY MOUTH EACH MORNING BEFORE BREAKFAST.  [name] loratadine (CLARITIN) 10 MG tablet Take  10 mg by mouth at bedtime as needed for allergies.   [name] losartan (COZAAR) 100 MG tablet TAKE ONE TABLET BY MOUTH ONCE DAILY.  [name] losartan (COZAAR) 50 MG tablet Take 50 mg by mouth at bedtime. Reported on 06/18/2015  . metFORMIN (GLUCOPHAGE) 500 MG tablet TAKE 2 TABLETS EACH MORNING, 1 TABLET AT NOON, AND 2 TABLETS EVERY EVENING.  . metoprolol succinate (TOPROL-XL) 50 MG 24 hr tablet TAKE (1) TABLET BY MOUTH TWICE DAILY.  . nitroGLYCERIN (NITROSTAT) 0.4 MG SL tablet DISSOLVE 1 TABLET UNDER TONGUE EVERY 5 MINUTES UP TO 15 MIN FOR CHESTPAIN. IF NO RELIEF CALL 911.  [name] ONE TOUCH ULTRA TEST test strip TEST BLOOD SUGAR UP TO 4 TIMES DAILY.  [name] ONETOUCH DELICA LANCETS 99991111 MISC USE AS DIRECTED TO TEST BLOOD SUGAR 4 TIMES DAILY.  [name] polyvinyl alcohol (LIQUIFILM TEARS) 1.4 % ophthalmic solution Place 1 drop into both eyes daily as needed for dry eyes.  . pravastatin (PRAVACHOL) 20 MG tablet Take 1 tablet (20 mg total) by mouth daily.  [name] rOPINIRole (REQUIP) 4 MG tablet TAKE 1 & 1/2 TABLETS BY MOUTH AT BEDTIME.  . sodium chloride (OCEAN) 0.65 % SOLN nasal spray Place 1-2 sprays into both nostrils daily as needed for congestion.  . sulfacetamide (BLEPH-10) 10 % ophthalmic solution Place 1 drop into both eyes every 2 (two) hours while awake. Then QID starting tomorrow  . Testosterone Cypionate & Prop 200-20 MG/ML SOLN Inject 1 mL into the muscle every 14 (fourteen) days.  [name] triamterene-hydrochlorothiazide (MAXZIDE-25) 37.5-25 MG tablet Take 1 tablet by mouth daily.  . Vitamin D, Ergocalciferol, (DRISDOL) 50000 units CAPS capsule TAKE 1 CAPSULE BY MOUTH ONCE A WEEK.   No facility-administered encounter medications on file as of 07/07/2015.   ALLERGIES: Allergies  Allergen Reactions  . Cardizem [Diltiazem Hcl]     Edema   . Cardura [Doxazosin Mesylate]     Side effects  . Lipitor [Atorvastatin] Other (See Comments)    Leg cramps  . Paxil [Paroxetine Hcl]     Side effects   . Adhesive [Tape] Rash  . Codeine  Rash and Other (See Comments)    Headache    VACCINATION STATUS: Immunization History  Administered Date(s) Administered  . Influenza Split 09/18/2012  . Influenza,inj,Quad PF,36+ Mos 10/13/2013, 10/06/2014  . Pneumococcal Polysaccharide-23 10/02/2004, 10/03/2011  . Td 05/24/2006    Diabetes He presents for his follow-up diabetic visit. He has type 2 diabetes mellitus. Onset time: Was diagnosed at approximate age of 56  years. His disease course has been improving. There are no hypoglycemic associated symptoms. Pertinent negatives for hypoglycemia include no confusion, headaches, pallor or seizures. Associated symptoms include polydipsia and polyuria. Pertinent negatives for diabetes include no chest pain, no fatigue, no polyphagia and no weakness. There are no hypoglycemic complications. Symptoms are improving. Diabetic complications include heart disease. Risk factors for coronary artery disease include diabetes mellitus, dyslipidemia, male sex, obesity, sedentary lifestyle and tobacco exposure. Current diabetic treatment includes intensive insulin program. He is compliant with treatment most of the time. His weight is stable. He is following a generally unhealthy diet. He has had a previous visit with a dietitian. He never participates in exercise. His home blood glucose trend is fluctuating minimally. His breakfast blood glucose range is generally 180-200 mg/dl. His lunch blood glucose range is generally 180-200 mg/dl. His dinner blood glucose range is generally 180-200 mg/dl. His overall blood glucose range is 180-200 mg/dl. An ACE inhibitor/angiotensin II receptor blocker is being taken. He sees a podiatrist.Eye exam is current.  Hyperlipidemia This is a chronic problem. The current episode started more than 1 year ago. Pertinent negatives include no chest pain, myalgias or shortness of breath. Current antihyperlipidemic treatment includes statins. Risk factors for coronary artery disease include  dyslipidemia, diabetes mellitus, hypertension, male sex, obesity and a sedentary lifestyle.  Hypertension This is a chronic problem. The current episode started more than 1 year ago. Pertinent negatives include no chest pain, headaches, neck pain, palpitations or shortness of breath. Past treatments include angiotensin blockers. Hypertensive end-organ damage includes CAD/MI and a thyroid problem.  Thyroid Problem Presents for follow-up visit. Patient reports no constipation, diarrhea, fatigue or palpitations. The symptoms have been improving. Past treatments include levothyroxine. His past medical history is significant for hyperlipidemia.     Review of Systems  Constitutional: Negative for fatigue and unexpected weight change.  HENT: Negative for dental problem, mouth sores and trouble swallowing.   Eyes: Negative for visual disturbance.  Respiratory: Negative for cough, choking, chest tightness, shortness of breath and wheezing.   Cardiovascular: Negative for chest pain, palpitations and leg swelling.  Gastrointestinal: Negative for nausea, vomiting, abdominal pain, diarrhea, constipation and abdominal distention.  Endocrine: Positive for polydipsia and polyuria. Negative for polyphagia.  Genitourinary: Negative for dysuria, urgency, hematuria and flank pain.  Musculoskeletal: Negative for myalgias, back pain, gait problem and neck pain.  Skin: Negative for pallor, rash and wound.  Neurological: Negative for seizures, syncope, weakness, numbness and headaches.  Psychiatric/Behavioral: Negative.  Negative for confusion and dysphoric mood.    Objective:    BP 139/78 mmHg  Pulse 82  Ht 5\' 5"  (1.651 m)  Wt 210 lb (95.255 kg)  BMI 34.95 kg/m2  Wt Readings from Last 3 Encounters:  07/07/15 210 lb (95.255 kg)  06/18/15 209 lb 2 oz (94.858 kg)  05/12/15 210 lb 9.6 oz (95.528 kg)    Physical Exam  Constitutional: He is oriented to person, place, and time. He appears well-developed and  well-nourished. He is cooperative. No distress.  HENT:  Head: Normocephalic and atraumatic.  Eyes: EOM are normal.  Neck: Normal range of motion. Neck supple. No tracheal deviation present. No thyromegaly present.  Cardiovascular: Normal rate, S1 normal, S2 normal and normal heart sounds.  Exam reveals no gallop.   No murmur heard. Pulses:      Dorsalis pedis pulses are 1+ on the right side, and 1+ on the left side.       Posterior tibial pulses are 1+ on  the right side, and 1+ on the left side.  Pulmonary/Chest: Breath sounds normal. No respiratory distress. He has no wheezes.  Abdominal: Soft. Bowel sounds are normal. He exhibits no distension. There is no tenderness. There is no guarding and no CVA tenderness.  Musculoskeletal: He exhibits no edema.       Right shoulder: He exhibits no swelling and no deformity.  Neurological: He is alert and oriented to person, place, and time. He has normal strength and normal reflexes. No cranial nerve deficit or sensory deficit. Gait normal.  Skin: Skin is warm and dry. No rash noted. No cyanosis. Nails show no clubbing.  Psychiatric: He has a normal mood and affect. His speech is normal and behavior is normal. Judgment and thought content normal. Cognition and memory are normal.    Results for orders placed or performed in visit on 0000000  Basic metabolic panel  Result Value Ref Range   Sodium 139 135 - 146 mmol/L   Potassium 4.5 3.5 - 5.3 mmol/L   Chloride 104 98 - 110 mmol/L   CO2 27 20 - 31 mmol/L   Glucose, Bld 87 65 - 99 mg/dL   BUN 30 (H) 7 - 25 mg/dL   Creat 1.21 0.70 - 1.25 mg/dL   Calcium 9.2 8.6 - 10.3 mg/dL  TSH  Result Value Ref Range   TSH 0.62 0.40 - 4.50 mIU/L  T4, free  Result Value Ref Range   Free T4 1.1 0.8 - 1.8 ng/dL  Hemoglobin A1c  Result Value Ref Range   Hgb A1c MFr Bld 8.8 (H) <5.7 %   Mean Plasma Glucose 206 mg/dL   Diabetic Labs (most recent): Lab Results  Component Value Date   HGBA1C 8.8* 06/21/2015    HGBA1C 9.2* 02/22/2015   HGBA1C 8.0 02/01/2015   Lipid profile (most recent): Lab Results  Component Value Date   TRIG 96 02/22/2015   CHOL 176 02/22/2015     Assessment & Plan:   1. Uncontrolled type 2 diabetes mellitus with other circulatory complication, with long-term current use of insulin (HCC)   -His diabetes is  complicated by ordinary artery disease and patient remains at a high risk for more acute and chronic complications of diabetes which include CAD, CVA, CKD, retinopathy, and neuropathy. These are all discussed in detail with the patient.  Patient came with Improved glucose profile on a  large dose of insulin, and  recent A1c improving to 8.8% from  9.2% . This is largely due to his dietary indiscretion.  Glucose logs and insulin administration records pertaining to this visit,  to be scanned into patient's records.  Recent labs reviewed.   - I have re-counseled the patient on diet management and weight loss  by adopting a carbohydrate restricted / protein rich  Diet.  - Suggestion is made for patient to avoid simple carbohydrates   from their diet including Cakes , Desserts, Ice Cream,  Soda (  diet and regular) , Sweet Tea , Candies,  Chips, Cookies, Artificial Sweeteners,   and "Sugar-free" Products .  This will help patient to have stable blood glucose profile and potentially avoid unintended  Weight gain.  - Patient is advised to stick to a routine mealtimes to eat 3 meals  a day and avoid unnecessary snacks ( to snack only to correct hypoglycemia).  - The patient  has been  scheduled with Jearld Fenton, RDN, CDE for individualized DM education.  - I have approached patient with the following individualized  plan to manage diabetes and patient agrees.  -Continue Levemir 100 units daily at bedtime , and Humalog u200  25 units TIDAC plus SSI associated with monitoring of BG ac and hs.   -Patient is encouraged to call clinic for blood glucose levels less than 70  or above 300 mg /dl. - I will continue low-dose metformin 500 mg by mouth twice a day. -  he was informed by his insurance that they would not pay for [facility]. - Patient will be considered for incretin therapy as appropriate next visit. - Patient specific target  for A1c; LDL, HDL, Triglycerides, and  Waist Circumference were discussed in detail.  2) BP/HTN: Controlled. Continue current medications including ACEI/ARB. 3) Lipids/HPL:  continue statins. 4)  Weight/Diet: CDE consult in progress, exercise, and carbohydrates information provided. 5)Other specified hypothyroidism - continue levothyroxine  125 g by mouth daily before breakfast.  - We discussed about correct intake of levothyroxine, at fasting, with water, separated by at least 30 minutes from breakfast, and separated by more than 4 hours from calcium, iron, multivitamins, acid reflux medications (PPIs). -Patient is made aware of the fact that thyroid hormone replacement is needed for life, dose to be adjusted by periodic monitoring of thyroid function tests.  6) Chronic Care/Health Maintenance:  -Patient is on ACEI/ARB and Statin medications and encouraged to continue to follow up with Ophthalmology, Podiatrist at least yearly or according to recommendations, and advised to  stay away from smoking. I have recommended yearly flu vaccine and pneumonia vaccination at least every 5 years; moderate intensity exercise for up to 150 minutes weekly; and  sleep for at least 7 hours a day.  - 25 minutes of time was spent on the care of this patient , 50% of which was applied for counseling on diabetes complications and their preventions.  - I advised patient to maintain close follow up with [name], MD for primary care needs.  Patient is asked to bring meter and  blood glucose logs during their next visit.   Follow up plan: -Return in about 3 months (around 10/07/2015) for follow up with pre-visit labs, meter, and logs.  Glade Lloyd,  MD Phone: [phone]  Fax: [phone]   07/07/2015, 3:41 PM

## 2015-07-07 NOTE — Patient Instructions (Signed)

## 2015-07-15 DIAGNOSIS — E291 Testicular hypofunction: Secondary | ICD-10-CM | POA: Diagnosis not present

## 2015-07-21 ENCOUNTER — Other Ambulatory Visit: Payer: Self-pay | Admitting: "Endocrinology

## 2015-07-21 ENCOUNTER — Other Ambulatory Visit: Payer: Self-pay | Admitting: Family Medicine

## 2015-07-29 DIAGNOSIS — E291 Testicular hypofunction: Secondary | ICD-10-CM | POA: Diagnosis not present

## 2015-07-30 ENCOUNTER — Encounter: Payer: Self-pay | Admitting: Family Medicine

## 2015-07-30 ENCOUNTER — Ambulatory Visit (INDEPENDENT_AMBULATORY_CARE_PROVIDER_SITE_OTHER): Payer: Medicare Other | Admitting: Family Medicine

## 2015-07-30 VITALS — BP 122/78 | Ht 65.0 in | Wt 207.2 lb

## 2015-07-30 DIAGNOSIS — Z125 Encounter for screening for malignant neoplasm of prostate: Secondary | ICD-10-CM

## 2015-07-30 DIAGNOSIS — E785 Hyperlipidemia, unspecified: Secondary | ICD-10-CM

## 2015-07-30 DIAGNOSIS — R7309 Other abnormal glucose: Secondary | ICD-10-CM

## 2015-07-30 DIAGNOSIS — I10 Essential (primary) hypertension: Secondary | ICD-10-CM | POA: Diagnosis not present

## 2015-07-30 DIAGNOSIS — Z79899 Other long term (current) drug therapy: Secondary | ICD-10-CM | POA: Diagnosis not present

## 2015-07-30 DIAGNOSIS — I25119 Atherosclerotic heart disease of native coronary artery with unspecified angina pectoris: Secondary | ICD-10-CM

## 2015-07-30 MED ORDER — PRAVASTATIN SODIUM 20 MG PO TABS: 20.0000 mg | ORAL_TABLET | Freq: Every day | ORAL | 5 refills | Status: DC

## 2015-07-30 MED ORDER — TRIAMTERENE-HCTZ 37.5-25 MG PO TABS: 1.0000 | ORAL_TABLET | Freq: Every day | ORAL | 5 refills | Status: DC

## 2015-07-30 MED ORDER — FENOFIBRATE 160 MG PO TABS: ORAL_TABLET | ORAL | 5 refills | Status: DC

## 2015-07-30 NOTE — Progress Notes (Signed)
   Subjective:    Patient ID: Paul Baker, male    DOB: [birth year], 65 y.o.   MRN: [id number]  Hypertension  This is a chronic problem. The current episode started more than 1 year ago. Risk factors for coronary artery disease include diabetes mellitus, dyslipidemia, male gender and obesity. Treatments tried: metoprolol, norvasc, imdur, triam/hctz,losartan. There are no compliance problems.    Results for orders placed or performed in visit on 0000000  Basic metabolic panel  Result Value Ref Range   Sodium 139 135 - 146 mmol/L   Potassium 4.5 3.5 - 5.3 mmol/L   Chloride 104 98 - 110 mmol/L   CO2 27 20 - 31 mmol/L   Glucose, Bld 87 65 - 99 mg/dL   BUN 30 (H) 7 - 25 mg/dL   Creat 1.21 0.70 - 1.25 mg/dL   Calcium 9.2 8.6 - 10.3 mg/dL  TSH  Result Value Ref Range   TSH 0.62 0.40 - 4.50 mIU/L  T4, free  Result Value Ref Range   Free T4 1.1 0.8 - 1.8 ng/dL  Hemoglobin A1c  Result Value Ref Range   Hgb A1c MFr Bld 8.8 (H) <5.7 %   Mean Plasma Glucose 206 mg/dL   Patient continues to take lipid medication regularly. No obvious side effects from it. Generally does not miss a dose. Prior blood work results are reviewed with patient. Patient continues to work on fat intake in diet  Patient reports his back is giving him trouble. Pain primarily in the lumbar region. With some radiation down into the right leg. Worse with certain motions.  Review of Systems No headache, no major weight loss or weight gain, no chest pain no back pain abdominal pain no change in bowel habits complete ROS otherwise negative     Objective:   Physical Exam Alert Alert vitals stable, NAD. Blood pressure good on repeat. HEENT normal. Lungs clear. Heart regular rate and rhythm. Low back some tenderness to percussion. Plus minus straight leg raise on the right.       Assessment & Plan:  Impression 1 subacute lumbar strain discussed local measures discussed medications discussed #2 hypertension good  good control meds reviewed to remain maintain same #3 hyperlipidemia prior blood work reviewed need for further blood work discussed diet exercise discussed medications refilled

## 2015-07-31 ENCOUNTER — Other Ambulatory Visit: Payer: Self-pay | Admitting: Neurology

## 2015-08-02 NOTE — Telephone Encounter (Signed)
RX for nuvigil faxed to [facility]. Received a receipt of confirmation.

## 2015-08-09 DIAGNOSIS — Z981 Arthrodesis status: Secondary | ICD-10-CM | POA: Diagnosis not present

## 2015-08-09 DIAGNOSIS — M545 Low back pain: Secondary | ICD-10-CM | POA: Diagnosis not present

## 2015-08-09 DIAGNOSIS — G2581 Restless legs syndrome: Secondary | ICD-10-CM | POA: Diagnosis not present

## 2015-08-09 DIAGNOSIS — Z79899 Other long term (current) drug therapy: Secondary | ICD-10-CM | POA: Diagnosis not present

## 2015-08-11 ENCOUNTER — Other Ambulatory Visit: Payer: Self-pay | Admitting: "Endocrinology

## 2015-08-12 DIAGNOSIS — E291 Testicular hypofunction: Secondary | ICD-10-CM | POA: Diagnosis not present

## 2015-08-19 DIAGNOSIS — Z125 Encounter for screening for malignant neoplasm of prostate: Secondary | ICD-10-CM | POA: Diagnosis not present

## 2015-08-19 DIAGNOSIS — Z79899 Other long term (current) drug therapy: Secondary | ICD-10-CM | POA: Diagnosis not present

## 2015-08-19 DIAGNOSIS — E785 Hyperlipidemia, unspecified: Secondary | ICD-10-CM | POA: Diagnosis not present

## 2015-08-20 LAB — HEPATIC FUNCTION PANEL
ALT: 12 IU/L (ref 0–44)
AST: 12 IU/L (ref 0–40)
Albumin: 4.3 g/dL (ref 3.6–4.8)
Alkaline Phosphatase: 38 IU/L — ABNORMAL LOW (ref 39–117)
Bilirubin Total: 0.3 mg/dL (ref 0.0–1.2)
Bilirubin, Direct: 0.12 mg/dL (ref 0.00–0.40)
TOTAL PROTEIN: 6.7 g/dL (ref 6.0–8.5)

## 2015-08-20 LAB — LIPID PANEL
CHOL/HDL RATIO: 2.8 ratio (ref 0.0–5.0)
Cholesterol, Total: 148 mg/dL (ref 100–199)
HDL: 53 mg/dL (ref >39)
LDL CALC: 80 mg/dL (ref 0–99)
Triglycerides: 77 mg/dL (ref 0–149)
VLDL CHOLESTEROL CAL: 15 mg/dL (ref 5–40)

## 2015-08-20 LAB — PSA: PROSTATE SPECIFIC AG, SERUM: 4.2 ng/mL — AB (ref 0.0–4.0)

## 2015-08-25 ENCOUNTER — Ambulatory Visit (INDEPENDENT_AMBULATORY_CARE_PROVIDER_SITE_OTHER): Payer: Medicare Other | Admitting: Family Medicine

## 2015-08-25 ENCOUNTER — Encounter: Payer: Self-pay | Admitting: Family Medicine

## 2015-08-25 VITALS — BP 130/84 | Ht 65.0 in | Wt 213.4 lb

## 2015-08-25 DIAGNOSIS — I25119 Atherosclerotic heart disease of native coronary artery with unspecified angina pectoris: Secondary | ICD-10-CM | POA: Diagnosis not present

## 2015-08-25 DIAGNOSIS — R972 Elevated prostate specific antigen [PSA]: Secondary | ICD-10-CM

## 2015-08-25 NOTE — Patient Instructions (Signed)
Results for orders placed or performed in visit on 07/30/15  Lipid panel  Result Value Ref Range   Cholesterol, Total 148 100 - 199 mg/dL   Triglycerides 77 0 - 149 mg/dL   HDL 53 >39 mg/dL   VLDL Cholesterol Cal 15 5 - 40 mg/dL   LDL Calculated 80 0 - 99 mg/dL   Chol/HDL Ratio 2.8 0.0 - 5.0 ratio units  Hepatic function panel  Result Value Ref Range   Total Protein 6.7 6.0 - 8.5 g/dL   Albumin 4.3 3.6 - 4.8 g/dL   Bilirubin Total 0.3 0.0 - 1.2 mg/dL   Bilirubin, Direct 0.12 0.00 - 0.40 mg/dL   Alkaline Phosphatase 38 (L) 39 - 117 IU/L   AST 12 0 - 40 IU/L   ALT 12 0 - 44 IU/L  PSA  Result Value Ref Range   Prostate Specific Ag, Serum 4.2 (H) 0.0 - 4.0 ng/mL

## 2015-08-25 NOTE — Progress Notes (Signed)
   Subjective:    Patient ID: Paul Baker, male    DOB: 19-Jan-1950, 65 y.o.   MRN: [id number]  HPI  Patient arrives to discuss recent lab results.Patient arrives office for discussion. He does have urologist. Due to see them in fact in several weeks. No increased urinary frequency. No hematuria. Some family history of prostate cancer in uncle.  Review of Systems No headache, no major weight loss or weight gain, no chest pain no back pain abdominal pain no change in bowel habits complete ROS otherwise negative     Objective:   Physical Exam  Alert vitals stable, NAD. Blood pressure good on repeat. HEENT normal. Lungs clear. Heart regular rate and rhythm.       Assessment & Plan:  Impression 1 elevated PSA. Very mild elevation 4.2. Plan patient to follow-up with urologist. Discussed likely need for ultrasound. Along with biopsy. Of note patient started on testosterone recently by urologist

## 2015-08-31 DIAGNOSIS — N189 Chronic kidney disease, unspecified: Secondary | ICD-10-CM | POA: Diagnosis not present

## 2015-08-31 DIAGNOSIS — R42 Dizziness and giddiness: Secondary | ICD-10-CM | POA: Diagnosis not present

## 2015-08-31 DIAGNOSIS — I10 Essential (primary) hypertension: Secondary | ICD-10-CM | POA: Diagnosis not present

## 2015-08-31 DIAGNOSIS — E119 Type 2 diabetes mellitus without complications: Secondary | ICD-10-CM | POA: Diagnosis not present

## 2015-08-31 DIAGNOSIS — Z6837 Body mass index (BMI) 37.0-37.9, adult: Secondary | ICD-10-CM | POA: Diagnosis not present

## 2015-09-01 DIAGNOSIS — M9954 Intervertebral disc stenosis of neural canal of sacral region: Secondary | ICD-10-CM | POA: Diagnosis not present

## 2015-09-01 DIAGNOSIS — M9953 Intervertebral disc stenosis of neural canal of lumbar region: Secondary | ICD-10-CM | POA: Diagnosis not present

## 2015-09-08 NOTE — Progress Notes (Signed)
Cardiology Office Note  Date: 09/09/2015   ID: [name], DOB [birth year], MRN [id number]  PCP: [name], MD  Primary Cardiologist: [name], MD   Chief Complaint  Patient presents with  . Coronary Artery Disease    History of Present Illness: Paul Baker is a 65 y.o. male last seen in February. He presents for a routine follow-up visit. He reports stable, intermittent angina symptoms that resolved with nitroglycerin. There has been no change in his baseline ischemic regimen. He has not been exercising as regularly, his wife has been sick and he has been taking care of her. He is also limited by orthopedic problems including a torn left rotator cuff.  I reviewed his recent follow-up lab work as outlined below.  Past Medical History:  Diagnosis Date  . Arthritis   . Asthma   . Colon polyp   . Coronary atherosclerosis of native coronary artery    Diseased nondominant RCA  . DVT (deep venous thrombosis) ([place]) 2005   Right arm  . Essential hypertension, benign   . Headache   . History of transfusion   . Hypersomnia    CPAP of 16 cm, diagnosed with AHI of 60 in 2012,epworth 21- narcolepsy?  [name] Hypothyroidism   . Mixed hyperlipidemia   . Morbid obesity ([place])   . MRSA (methicillin resistant staph aureus) culture positive    08/2012  . Narcolepsy   . OSA (obstructive sleep apnea)   . Pneumonia   . Psoriasis   . Pulmonary embolism ([place]) 2004  . RLS (restless legs syndrome)   . Rotator cuff disorder    Left  . Septic arthritis of knee, left ([place])   . Skin cancer, basal cell   . Type 2 diabetes mellitus ([place])     Current Outpatient Prescriptions  Medication Sig Dispense Refill  . albuterol (VENTOLIN HFA) 108 (90 Base) MCG/ACT inhaler Inhale 2 puffs into the lungs 4 (four) times daily as needed for wheezing or shortness of breath. 18 g 0  . amLODipine (NORVASC) 5 MG tablet TAKE ONE TABLET BY MOUTH ONCE DAILY. 30 tablet 6  . Armodafinil 200 MG TABS  TAKE 1 TABLET BY MOUTH EACH MORNING. 30 tablet 0  . B-D ULTRAFINE III SHORT PEN 31G X 8 MM MISC USING FIVE TIMES DAILY. 100 each 5  . clobetasol (TEMOVATE) 0.05 % external solution Apply 1 application topically daily as needed (irritation.).     [name] clonazePAM (KLONOPIN) 1 MG tablet TAKE (1) TABLET BY MOUTH AT BEDTIME FOR RESTLESS LEGS. 30 tablet 5  . fenofibrate 160 MG tablet TAKE (1) TABLET BY MOUTH AT BEDTIME. 30 tablet 5  . furosemide (LASIX) 40 MG tablet Take 40 mg by mouth every other day.     [name] HUMALOG KWIKPEN 200 UNIT/ML SOPN INJECT UP TO 31 UNITS THREE TIMES DAILY WITH MEALS AS DIRECTED. 12 mL 2  . isosorbide mononitrate (IMDUR) 60 MG 24 hr tablet TAKE (1/2) TABLET BY MOUTH TWICE DAILY. 60 tablet 2  . LEVEMIR FLEXTOUCH 100 UNIT/ML Pen INJECT 50 UNITS TWO TIMES DAILY. 30 mL 3  . levothyroxine (SYNTHROID, LEVOTHROID) 125 MCG tablet TAKE (1) TABLET BY MOUTH EACH MORNING BEFORE BREAKFAST. 30 tablet 5  . loratadine (CLARITIN) 10 MG tablet Take 10 mg by mouth at bedtime as needed for allergies.     [name] losartan (COZAAR) 50 MG tablet Take 50 mg by mouth at bedtime. Reported on 06/18/2015    . metFORMIN (GLUCOPHAGE) 500 MG tablet TAKE  2 TABLETS EACH MORNING, 1 TABLET AT NOON, AND 2 TABLETS EVERY EVENING. 150 tablet 2  . metoprolol succinate (TOPROL-XL) 50 MG 24 hr tablet TAKE (1) TABLET BY MOUTH TWICE DAILY. 60 tablet 6  . nitroGLYCERIN (NITROSTAT) 0.4 MG SL tablet DISSOLVE 1 TABLET UNDER TONGUE EVERY 5 MINUTES UP TO 15 MIN FOR CHESTPAIN. IF NO RELIEF CALL 911. 25 tablet 3  . ONE TOUCH ULTRA TEST test strip TEST BLOOD SUGAR UP TO 4 TIMES DAILY. 150 each 5  . ONETOUCH DELICA LANCETS 99991111 MISC USE AS DIRECTED TO TEST BLOOD SUGAR 4 TIMES DAILY. 150 each 5  . polyvinyl alcohol (LIQUIFILM TEARS) 1.4 % ophthalmic solution Place 1 drop into both eyes daily as needed for dry eyes.    . pravastatin (PRAVACHOL) 20 MG tablet Take 1 tablet (20 mg total) by mouth daily. 30 tablet 5  . rOPINIRole (REQUIP) 4 MG  tablet TAKE 1 & 1/2 TABLETS BY MOUTH AT BEDTIME. 45 tablet 5  . sodium chloride (OCEAN) 0.65 % SOLN nasal spray Place 1-2 sprays into both nostrils daily as needed for congestion.    . sulfacetamide (BLEPH-10) 10 % ophthalmic solution Place 1 drop into both eyes every 2 (two) hours while awake. Then QID starting tomorrow 10 mL 0  . Testosterone Cypionate & Prop 200-20 MG/ML SOLN Inject 1 mL into the muscle every 14 (fourteen) days.    . Vitamin D, Ergocalciferol, (DRISDOL) 50000 units CAPS capsule TAKE 1 CAPSULE BY MOUTH ONCE A WEEK. 12 capsule 0   No current facility-administered medications for this visit.    Allergies:  Cardizem [diltiazem hcl]; Cardura [doxazosin mesylate]; Lipitor [atorvastatin]; Paxil [paroxetine hcl]; Adhesive [tape]; and Codeine   Social History: The patient  reports that he quit smoking about 20 years ago. His smoking use included Cigarettes. He started smoking about 48 years ago. He has never used smokeless tobacco. He reports that he does not drink alcohol or use drugs.   ROS:  Please see the history of present illness. Otherwise, complete review of systems is positive for left shoulder pain and limited range of motion.  All other systems are reviewed and negative.   Physical Exam: VS:  BP (!) 150/72   Pulse 85   Ht 5\' 5"  (1.651 m)   Wt 212 lb (96.2 kg)   SpO2 95%   BMI 35.28 kg/m , BMI Body mass index is 35.28 kg/m.  Wt Readings from Last 3 Encounters:  09/09/15 212 lb (96.2 kg)  08/25/15 213 lb 6.4 oz (96.8 kg)  07/30/15 207 lb 3.2 oz (94 kg)    Overweight male, appears comfortable at rest. HEENT: Conjunctiva and lids normal, oropharynx clear. Neck: Supple, no elevated JVP or carotid bruits, no thyromegaly. Lungs: Clear to auscultation, nonlabored breathing at rest. Cardiac: Regular rate and rhythm, no S3 or significant systolic murmur, no pericardial rub. Abdomen: Soft, nontender, bowel sounds present, no guarding or rebound. Extremities: No pitting  edema, right knee with well healed anterior incision, distal pulses 2+.  ECG: I personally reviewed the tracing from 02/26/2015 which showed normal sinus rhythm.  Recent Labwork: 12/05/2014: Hemoglobin 12.7; Platelets 197 06/21/2015: BUN 30; Creat 1.21; Potassium 4.5; Sodium 139; TSH 0.62 08/19/2015: ALT 12; AST 12     Component Value Date/Time   CHOL 148 08/19/2015 0914   TRIG 77 08/19/2015 0914   HDL 53 08/19/2015 0914   CHOLHDL 2.8 08/19/2015 0914   CHOLHDL 4.3 11/13/2013 0843   VLDL 31 11/13/2013 0843   LDLCALC 80  08/19/2015 0914   Assessment and Plan:  1. Stable angina with CAD, diseased nondominant RCA which has been managed medically. We will continue the current regimen and observation for now.  2. Essential hypertension, blood pressure is elevated today. Discussed diet and basic exercise. No medication changes were made today. Keep follow-up with Dr. Wolfgang Phoenix.  3. Hyperlipidemia, recent LDL 80 on Pravachol.  Current medicines were reviewed with the patient today.  Disposition: Follow-up with me in 6 months.  Signed, Satira Sark, MD, [facility] 09/09/2015 8:32 AM    [facility] at [facility] [address] [place], [place] [address] Phone: [phone]; Fax: [phone]

## 2015-09-09 ENCOUNTER — Ambulatory Visit: Payer: Medicare Other | Admitting: Cardiology

## 2015-09-09 ENCOUNTER — Ambulatory Visit (INDEPENDENT_AMBULATORY_CARE_PROVIDER_SITE_OTHER): Payer: Medicare Other | Admitting: Cardiology

## 2015-09-09 ENCOUNTER — Encounter: Payer: Self-pay | Admitting: Cardiology

## 2015-09-09 VITALS — BP 150/72 | HR 85 | Ht 65.0 in | Wt 212.0 lb

## 2015-09-09 DIAGNOSIS — I10 Essential (primary) hypertension: Secondary | ICD-10-CM

## 2015-09-09 DIAGNOSIS — E782 Mixed hyperlipidemia: Secondary | ICD-10-CM

## 2015-09-09 DIAGNOSIS — I25119 Atherosclerotic heart disease of native coronary artery with unspecified angina pectoris: Secondary | ICD-10-CM | POA: Diagnosis not present

## 2015-09-09 NOTE — Patient Instructions (Signed)
Your physician wants you to follow-up in: 6 months with Dr.McDowell You will receive a reminder letter in the mail two months in advance. If you don't receive a letter, please call our office to schedule the follow-up appointment.     Your physician recommends that you continue on your current medications as directed. Please refer to the Current Medication list given to you today.     Thank you for choosing [facility] !        

## 2015-09-10 ENCOUNTER — Other Ambulatory Visit: Payer: Self-pay | Admitting: Cardiology

## 2015-09-16 ENCOUNTER — Telehealth: Payer: Self-pay | Admitting: Neurology

## 2015-09-16 DIAGNOSIS — N401 Enlarged prostate with lower urinary tract symptoms: Secondary | ICD-10-CM | POA: Diagnosis not present

## 2015-09-16 DIAGNOSIS — E291 Testicular hypofunction: Secondary | ICD-10-CM | POA: Diagnosis not present

## 2015-09-16 DIAGNOSIS — R3914 Feeling of incomplete bladder emptying: Secondary | ICD-10-CM | POA: Diagnosis not present

## 2015-09-21 ENCOUNTER — Other Ambulatory Visit: Payer: Self-pay | Admitting: "Endocrinology

## 2015-09-21 ENCOUNTER — Ambulatory Visit (INDEPENDENT_AMBULATORY_CARE_PROVIDER_SITE_OTHER): Payer: Medicare Other | Admitting: Adult Health

## 2015-09-21 ENCOUNTER — Encounter: Payer: Self-pay | Admitting: Adult Health

## 2015-09-21 VITALS — BP 110/62 | HR 73 | Ht 65.0 in | Wt 202.8 lb

## 2015-09-21 DIAGNOSIS — G4733 Obstructive sleep apnea (adult) (pediatric): Secondary | ICD-10-CM | POA: Diagnosis not present

## 2015-09-21 DIAGNOSIS — G4719 Other hypersomnia: Secondary | ICD-10-CM | POA: Diagnosis not present

## 2015-09-21 DIAGNOSIS — I25119 Atherosclerotic heart disease of native coronary artery with unspecified angina pectoris: Secondary | ICD-10-CM

## 2015-09-21 DIAGNOSIS — G2581 Restless legs syndrome: Secondary | ICD-10-CM | POA: Diagnosis not present

## 2015-09-21 DIAGNOSIS — Z9989 Dependence on other enabling machines and devices: Secondary | ICD-10-CM

## 2015-09-21 MED ORDER — ROPINIROLE HCL 4 MG PO TABS: ORAL_TABLET | ORAL | 5 refills | Status: DC

## 2015-09-21 MED ORDER — MODAFINIL 200 MG PO TABS: 200.0000 mg | ORAL_TABLET | Freq: Every day | ORAL | 5 refills | Status: DC

## 2015-09-21 NOTE — Progress Notes (Signed)
I agree with the assessment and plan as directed by NP .The patient is known to me .   Generoso Cropper, MD  

## 2015-09-21 NOTE — Progress Notes (Signed)
PATIENT: Paul Baker DOB: Jul 16, 1950  REASON FOR VISIT: follow up- obstructive sleep apnea on CPAP, restless leg syndrome HISTORY FROM: patient  HISTORY OF PRESENT ILLNESS: Today 09/21/2015: Paul Baker is a 65 year old male with a history of obstructive sleep apnea on CPAP and restless leg syndrome. He returns today for follow-up. He states that he did see a neurologist at [facility] who recommended that he stop Requip and Dilaudid and start methadone. He states that he tried methadone once but it made him very sick. He is no longer using this. He states that he restarted Requip. He states that he has restless leg symptoms throughout the day. He reports that sometimes she will take Requip half a tablet during the day to control his symptoms. He states that he rarely takes Dilaudid. They did repeat a lumbar MRI but does not feel that surgery is needed at this time. The patient also has obstructive sleep apnea. His download indicates that he uses machine 26 out of 30 days for compliance of 87%. He uses machine greater than 4 hours 20 out of 30 days for compliance of 67%. His residual AHI is 3 on a minimum pressure of 6 cm of water and maximum pressure of 16 cm water with EPR of 3. He does have a leak in the 95th percentile at 28.7 L/m. He states that he has not changed out his nasal pillows recently. He states that he has been on Nuvigil in the past for daytime sleepiness but they recently switched him to generic and he reports that this does not work well for him. He is tried patient assistance but does not qualify. He returns today for an evaluation.  HISTORY 05/12/15:  : Paul Baker is a 65 year old male with a history of obstructive sleep apnea on CPAP and restless leg syndrome. He returns today for follow-up. His download indicates that he uses machine 23 out of 30 days for compliance of 77%. He uses machine greater than 4 hours 20 out of 30 days for compliance of 67%. On average he is using  his machine 6 hours and 1 minute. His residual AHI is 1.8 on a minimum pressure of 6 cm of water and maximum pressure of 16 cm of water. He does not have a significant leak. He states that he was unable to use the machine for almost a week due to having the flu. The patient's main concern today is his restless leg symptoms. He is currently on Requip and Klonopin. In the past he's tried baclofen, Neurontin, Mirapex and the neupro patch. At the last visit he was given a prescription of Dilaudid. He states that this offers him no benefit either. He continues to have a hard time sleeping at night due to these symptoms. He returns today for an evaluation.  HISTORY [facility]): download from 09-08-13 through 10-07-13 revealed an 11.5 cm water pressure need, average daily used to time 4 hours 41 minutes, the patient used the machine on 27 of 30 days. The patient used the machine 4 hours on 20 of 30 days. Compliance over 4 hours or 66%. Residual AHI of 0.5. This patient with severe obstructive sleep apnea followed by Dr. Krista Blue. AHI 62 titrated to 16 cm water. DME was [employer] , PCP Dr.-Luking.  Apnea cannot longer be the cause for his excessive daytime sleepiness, I will try to adjust his medication treatment for restless leg syndrome.   Interval history from 11-12-14 Paul Baker returns today for compliance visit on  CPAP but also reports worsening of his restless leg syndrome. He has been 77% compliant with his CPAP use uses it on average 4 hours and 35 minutes. He is using an AutoSet between 5 and 16 cm water pressure, with a full-time expiratory pressure relief of 3 cm water. His AHI is 3.06 for flying a very good control of his previously severe obstructive sleep apnea. Sleep apnea is not the cause of his excessive daytime sleepiness anymore. His main problem is his restless legs which have been uncontrollable. He has tried to [employer], he has also tried Nuvigil in daytime to combat the daytime  sleepiness. He states that the Provigil does not work as well as any longer. He had tried Requip and Mirapex in oral form. At this time he is fairly desperate. The restless limbs as I called him now has expanded to the upper extremities. He had been evaluated for iron deficiency, low ferritin, spinal stenosis peripheral vascular disease and neuropathy. He likely has an inherited form.    He reports for the interval medical history that he was told his renal clearance has decreased, he was also hospitalized for a right knee replacement but in the meantime acquired a patella tendon tear. Left knee had been replaced in 2005. Dr. Vanetta Mulders is following him for his chronic kidney dysfunction. Today's Epworth sleepiness score is endorsed at 20 points, fatigue severity at 37 points and the geriatric depression score at 4 points. The [facility] restless leg syndrome quality of life questionnaire is endorsed at moderate to severe impairment. His RLS 6 rating scale is at the very severe impairment rating.    REVIEW OF SYSTEMS: Out of a complete 14 system review of symptoms, the patient complains only of the following symptoms, and all other reviewed systems are negative.  Painful urination, urgency, joint pain, joint swelling, back pain, aching muscles, muscle cramps, neck pain, neck stiffness, rash, moles, itching, restless leg, insomnia, apnea, frequent waking, daytime sleepiness, snoring, sleep talking, chest pain, leg swelling, hearing loss, ear pain, ringing in ears, runny nose, fatigue, excessive sweating, eye pain, eye discharge, painful urination, urgency, dizziness headache numbness swollen lymph nodes  ALLERGIES: Allergies  Allergen Reactions  . Cardizem [Diltiazem Hcl]     Edema   . Cardura [Doxazosin Mesylate]     Side effects  . Lipitor [Atorvastatin] Other (See Comments)    Leg cramps  . Paxil [Paroxetine Hcl]     Side effects   . Adhesive [Tape] Rash  . Codeine Rash  and Other (See Comments)    Headache     HOME MEDICATIONS: Outpatient Medications Prior to Visit  Medication Sig Dispense Refill  . albuterol (VENTOLIN HFA) 108 (90 Base) MCG/ACT inhaler Inhale 2 puffs into the lungs 4 (four) times daily as needed for wheezing or shortness of breath. 18 g 0  . amLODipine (NORVASC) 5 MG tablet TAKE ONE TABLET BY MOUTH ONCE DAILY. 30 tablet 6  . B-D ULTRAFINE III SHORT PEN 31G X 8 MM MISC USING FIVE TIMES DAILY. 100 each 5  . clobetasol (TEMOVATE) 0.05 % external solution Apply 1 application topically daily as needed (irritation.).     [name] clonazePAM (KLONOPIN) 1 MG tablet TAKE (1) TABLET BY MOUTH AT BEDTIME FOR RESTLESS LEGS. 30 tablet 5  . fenofibrate 160 MG tablet TAKE (1) TABLET BY MOUTH AT BEDTIME. 30 tablet 5  . furosemide (LASIX) 40 MG tablet Take 40 mg by mouth every other day.     [name] HUMALOG  KWIKPEN 200 UNIT/ML SOPN INJECT UP TO 31 UNITS THREE TIMES DAILY WITH MEALS AS DIRECTED. 12 mL 2  . isosorbide mononitrate (IMDUR) 60 MG 24 hr tablet TAKE (1/2) TABLET BY MOUTH TWICE DAILY. 60 tablet 2  . LEVEMIR FLEXTOUCH 100 UNIT/ML Pen INJECT 50 UNITS TWO TIMES DAILY. (Patient taking differently: inject 100units at bedtime) 30 mL 3  . levothyroxine (SYNTHROID, LEVOTHROID) 125 MCG tablet TAKE (1) TABLET BY MOUTH EACH MORNING BEFORE BREAKFAST. 30 tablet 5  . loratadine (CLARITIN) 10 MG tablet Take 10 mg by mouth at bedtime as needed for allergies.     [name] losartan (COZAAR) 50 MG tablet Take 50 mg by mouth at bedtime. Reported on 06/18/2015    . metFORMIN (GLUCOPHAGE) 500 MG tablet TAKE 2 TABLETS EACH MORNING, 1 TABLET AT NOON, AND 2 TABLETS EVERY EVENING. (Patient taking differently: TAKE 1 TABLET EACH MORNING, 1 TABLET EVERY EVENING.) 150 tablet 2  . metoprolol succinate (TOPROL-XL) 50 MG 24 hr tablet TAKE (1) TABLET BY MOUTH TWICE DAILY. 60 tablet 6  . nitroGLYCERIN (NITROSTAT) 0.4 MG SL tablet DISSOLVE 1 TABLET UNDER TONGUE EVERY 5 MINUTES UP TO 15 MIN FOR CHESTPAIN.  IF NO RELIEF CALL 911. 25 tablet 3  . ONE TOUCH ULTRA TEST test strip TEST BLOOD SUGAR UP TO 4 TIMES DAILY. 150 each 5  . ONETOUCH DELICA LANCETS 99991111 MISC USE AS DIRECTED TO TEST BLOOD SUGAR 4 TIMES DAILY. 150 each 5  . pravastatin (PRAVACHOL) 20 MG tablet Take 1 tablet (20 mg total) by mouth daily. 30 tablet 5  . rOPINIRole (REQUIP) 4 MG tablet TAKE 1 & 1/2 TABLETS BY MOUTH AT BEDTIME. 45 tablet 5  . sodium chloride (OCEAN) 0.65 % SOLN nasal spray Place 1-2 sprays into both nostrils daily as needed for congestion.    . Testosterone Cypionate & Prop 200-20 MG/ML SOLN Inject 1 mL into the muscle every 14 (fourteen) days.    . Vitamin D, Ergocalciferol, (DRISDOL) 50000 units CAPS capsule TAKE 1 CAPSULE BY MOUTH ONCE A WEEK. 12 capsule 0  . Armodafinil 200 MG TABS TAKE 1 TABLET BY MOUTH EACH MORNING. (Patient not taking: Reported on 09/21/2015) 30 tablet 0  . polyvinyl alcohol (LIQUIFILM TEARS) 1.4 % ophthalmic solution Place 1 drop into both eyes daily as needed for dry eyes.    [name] sulfacetamide (BLEPH-10) 10 % ophthalmic solution Place 1 drop into both eyes every 2 (two) hours while awake. Then QID starting tomorrow (Patient not taking: Reported on 09/21/2015) 10 mL 0   No facility-administered medications prior to visit.     PAST MEDICAL HISTORY: Past Medical History:  Diagnosis Date  . Arthritis   . Asthma   . Colon polyp   . Coronary atherosclerosis of native coronary artery    Diseased nondominant RCA  . DVT (deep venous thrombosis) ([place]) 2005   Right arm  . Essential hypertension, benign   . Headache   . History of transfusion   . Hypersomnia    CPAP of 16 cm, diagnosed with AHI of 60 in 2012,epworth 21- narcolepsy?  [name] Hypothyroidism   . Mixed hyperlipidemia   . Morbid obesity ([place])   . MRSA (methicillin resistant staph aureus) culture positive    08/2012  . Narcolepsy   . OSA (obstructive sleep apnea)   . Pneumonia   . Psoriasis   . Pulmonary embolism ([place]) 2004  . RLS  (restless legs syndrome)   . Rotator cuff disorder    Left  . Septic arthritis of  knee, left ([place])   . Skin cancer, basal cell   . Type 2 diabetes mellitus ([place])     PAST SURGICAL HISTORY: Past Surgical History:  Procedure Laterality Date  . BACK SURGERY    . CATARACT EXTRACTION Bilateral   . COLONOSCOPY    . EYE SURGERY Left 2016   laser to left eye  . HIP SURGERY     bone removed from both sides of hip  . KNEE ARTHROTOMY Right 12/04/2014   Procedure: KNEE ARTHROTOMY PATELLA LIGAMENT RECONSTRUSION AND REPAIR RIGHT KNEE;  Surgeon: Paralee Cancel, MD;  Location: [facility];  Service: Orthopedics;  Laterality: Right;  . KNEE SURGERY     X 25 TIMES  . LUMBAR DISC SURGERY     Left L3, L4, L5 discecotomy with decompression of L4 root  . TONSILLECTOMY    . TOTAL KNEE ARTHROPLASTY  2003   LEFT  . TOTAL KNEE ARTHROPLASTY Right 03/23/2014   Procedure: RIGHT TOTAL KNEE ARTHROPLASTY AND REMOVAL RIGHT TIBIAL  DEEP IMPLANT STAPLE;  Surgeon: Paralee Cancel, MD;  Location: [facility];  Service: Orthopedics;  Laterality: Right;  . TOTAL KNEE REVISION  2005   LEFT  . WRIST SURGERY      FAMILY HISTORY: Family History  Problem Relation Age of Onset  . Hypertension Father   . Heart attack Father   . Kidney Stones Father   . Seizures Grandchild   . Narcolepsy Grandchild   . Diabetes Sister     SOCIAL HISTORY: Social History   Social History  . Marital status: Married    Spouse name: Toney Reil  . Number of children: 2  . Years of education: college   Occupational History  . Disabled   .  Unemployed   Social History Main Topics  . Smoking status: Former Smoker    Types: Cigarettes    Start date: 06/19/1967    Quit date: 01/03/1995  . Smokeless tobacco: Never Used  . Alcohol use No     Comment: quit drinking in 07/86  . Drug use: No  . Sexual activity: Yes    Partners: Female   Other Topics Concern  . Not on file   Social History Narrative    65 year old, right-handed, caucasian male with  a past medical history of obesity, hypertension, hyperlipidemia, diabetes, obstructive sleep apnea, presenting with frequent nighttime awakenings, excessive daytime sleepiness, also transient confusional episodes.RLS and one beosity, OSA on CPAP with AHI of 3.2 and  setting of 16 cm water , [facility] .      PHYSICAL EXAM  Vitals:   09/21/15 1511  BP: 110/62  Pulse: 73  Weight: 202 lb 12.8 oz (92 kg)  Height: 5\' 5"  (1.651 m)   Body mass index is 33.75 kg/m.  Generalized: Well developed, in no acute distress   Neurological examination  Mentation: Alert oriented to time, place, history taking. Follows all commands speech and language fluent Cranial nerve II-XII: Pupils were equal round reactive to light. Extraocular movements were full, visual field were full on confrontational test. Facial sensation and strength were normal. Uvula tongue midline. Head turning and shoulder shrug  were normal and symmetric. Motor: The motor testing reveals 5 over 5 strength of all 4 extremities. Good symmetric motor tone is noted throughout.  Sensory: Sensory testing is intact to soft touch on all 4 extremities. No evidence of extinction is noted.  Coordination: Cerebellar testing reveals good finger-nose-finger and heel-to-shin bilaterally.  Gait and station: Gait is normal. Tandem gait Not attempted.  Romberg is negative. No drift is seen.  Reflexes: Deep tendon reflexes are symmetric and normal bilaterally.   DIAGNOSTIC DATA (LABS, IMAGING, TESTING) - I reviewed patient records, labs, notes, testing and imaging myself where available.  Lab Results  Component Value Date   WBC 8.2 12/05/2014   HGB 12.7 (L) 12/05/2014   HCT 40.5 12/05/2014   MCV 91.6 12/05/2014   PLT 197 12/05/2014      Component Value Date/Time   NA 139 06/21/2015 0831   NA 141 10/08/2013 1032   K 4.5 06/21/2015 0831   CL 104 06/21/2015 0831   CO2 27 06/21/2015 0831   GLUCOSE 87 06/21/2015 0831   BUN 30 (H) 06/21/2015  0831   BUN 28 (H) 10/08/2013 1032   CREATININE 1.21 06/21/2015 0831   CALCIUM 9.2 06/21/2015 0831   PROT 6.7 08/19/2015 0914   ALBUMIN 4.3 08/19/2015 0914   AST 12 08/19/2015 0914   ALT 12 08/19/2015 0914   ALKPHOS 38 (L) 08/19/2015 0914   BILITOT 0.3 08/19/2015 0914   GFRNONAA 51 (L) 12/05/2014 0451   GFRAA 59 (L) 12/05/2014 0451   Lab Results  Component Value Date   CHOL 148 08/19/2015   HDL 53 08/19/2015   LDLCALC 80 08/19/2015   TRIG 77 08/19/2015   CHOLHDL 2.8 08/19/2015   Lab Results  Component Value Date   HGBA1C 8.8 (H) 06/21/2015    Lab Results  Component Value Date   TSH 0.62 06/21/2015      ASSESSMENT AND PLAN 65 y.o. year old male  has a past medical history of Arthritis; Asthma; Colon polyp; Coronary atherosclerosis of native coronary artery; DVT (deep venous thrombosis) ([place]) (2005); Essential hypertension, benign; Headache; History of transfusion; Hypersomnia; Hypothyroidism; Mixed hyperlipidemia; Morbid obesity ([place]); MRSA (methicillin resistant staph aureus) culture positive; Narcolepsy; OSA (obstructive sleep apnea); Pneumonia; Psoriasis; Pulmonary embolism ([place]) (2004); RLS (restless legs syndrome); Rotator cuff disorder; Septic arthritis of knee, left ([place]); Skin cancer, basal cell; and Type 2 diabetes mellitus ([place]). here with:  1. Restless leg syndrome 2. OSA on CPAP 3. Excessive daytime sleepiness  I will increase the patient's Requip to half a tablet in the morning and one and a half tablets in the evening. Advised that taking Requip during the day can make his sleepiness worse. He voiced understanding. We will try Provigil for daytime sleepiness. He will return in 4 months with Dr. Brett Fairy.   Ward Givens, MSN, NP-C 09/21/2015, 3:32 PM [facility] [address] [place], [place] [address] [phone]

## 2015-09-21 NOTE — Patient Instructions (Signed)
Increase Requip to 0.5 tablet in the morning and 1.5 tablets at bedtime Stop nuvigil try provigil If your symptoms worsen or you develop new symptoms please let us know.

## 2015-09-24 DIAGNOSIS — E291 Testicular hypofunction: Secondary | ICD-10-CM | POA: Diagnosis not present

## 2015-09-27 ENCOUNTER — Telehealth: Payer: Self-pay | Admitting: *Deleted

## 2015-09-27 NOTE — Telephone Encounter (Signed)
LMVM for pt to return call relating to his insurance carrier.  We have [employer]?   Trying to get PA for pt on his modifinil.

## 2015-09-28 DIAGNOSIS — E291 Testicular hypofunction: Secondary | ICD-10-CM | POA: Diagnosis not present

## 2015-09-30 NOTE — Telephone Encounter (Addendum)
I returned call to pt. I LM.  I have his member ID [id number] on file.  When placed into cover my meds its brings up another # [id number]).

## 2015-09-30 NOTE — Telephone Encounter (Signed)
Pt returned call and he does have [place].

## 2015-09-30 NOTE — Telephone Encounter (Signed)
I called pharmacy they had [id number].  Last filled 06-23-2015.  I called [facility] ID [id number] alternate ID# [id number].  Approved modafinil 200mg  po qday. For 12 months. 09-30-15 Shawn Stall [id number].

## 2015-09-30 NOTE — Telephone Encounter (Signed)
LMVM for pt on mobile, re: PA for modafinil.   Will use [employer] information on our record. (different ID# from pharmacy).

## 2015-10-04 ENCOUNTER — Other Ambulatory Visit: Payer: Self-pay | Admitting: "Endocrinology

## 2015-10-07 ENCOUNTER — Ambulatory Visit: Payer: Medicare Other | Admitting: "Endocrinology

## 2015-10-08 ENCOUNTER — Other Ambulatory Visit: Payer: Self-pay | Admitting: Family Medicine

## 2015-10-08 ENCOUNTER — Other Ambulatory Visit: Payer: Self-pay | Admitting: "Endocrinology

## 2015-10-08 DIAGNOSIS — E291 Testicular hypofunction: Secondary | ICD-10-CM | POA: Diagnosis not present

## 2015-10-11 ENCOUNTER — Ambulatory Visit: Payer: Medicare Other | Admitting: Cardiology

## 2015-10-12 ENCOUNTER — Other Ambulatory Visit: Payer: Self-pay | Admitting: "Endocrinology

## 2015-10-12 DIAGNOSIS — E1159 Type 2 diabetes mellitus with other circulatory complications: Secondary | ICD-10-CM | POA: Diagnosis not present

## 2015-10-12 LAB — COMPLETE METABOLIC PANEL WITH GFR
ALBUMIN: 4.1 g/dL (ref 3.6–5.1)
ALK PHOS: 43 U/L (ref 40–115)
ALT: 30 U/L (ref 9–46)
AST: 69 U/L — AB (ref 10–35)
BUN: 20 mg/dL (ref 7–25)
CALCIUM: 9.6 mg/dL (ref 8.6–10.3)
CO2: 27 mmol/L (ref 20–31)
CREATININE: 1 mg/dL (ref 0.70–1.25)
Chloride: 104 mmol/L (ref 98–110)
GFR, Est African American: >89 mL/min (ref >=60)
GFR, Est Non African American: 79 mL/min (ref >=60)
Glucose, Bld: 135 mg/dL — ABNORMAL HIGH (ref 65–99)
POTASSIUM: 4.6 mmol/L (ref 3.5–5.3)
Sodium: 139 mmol/L (ref 135–146)
Total Bilirubin: 0.4 mg/dL (ref 0.2–1.2)
Total Protein: 6.7 g/dL (ref 6.1–8.1)

## 2015-10-13 LAB — HEMOGLOBIN A1C
HEMOGLOBIN A1C: 9.3 % — AB (ref <5.7)
MEAN PLASMA GLUCOSE: 220 mg/dL

## 2015-10-18 DIAGNOSIS — E291 Testicular hypofunction: Secondary | ICD-10-CM | POA: Diagnosis not present

## 2015-10-19 ENCOUNTER — Other Ambulatory Visit: Payer: Self-pay | Admitting: "Endocrinology

## 2015-10-19 ENCOUNTER — Other Ambulatory Visit: Payer: Self-pay | Admitting: Cardiology

## 2015-10-20 DIAGNOSIS — R3914 Feeling of incomplete bladder emptying: Secondary | ICD-10-CM | POA: Diagnosis not present

## 2015-10-20 DIAGNOSIS — E291 Testicular hypofunction: Secondary | ICD-10-CM | POA: Diagnosis not present

## 2015-10-21 ENCOUNTER — Ambulatory Visit (INDEPENDENT_AMBULATORY_CARE_PROVIDER_SITE_OTHER): Payer: Medicare Other | Admitting: "Endocrinology

## 2015-10-21 ENCOUNTER — Encounter: Payer: Self-pay | Admitting: "Endocrinology

## 2015-10-21 VITALS — BP 136/85 | HR 65 | Ht 65.0 in | Wt 213.0 lb

## 2015-10-21 DIAGNOSIS — E782 Mixed hyperlipidemia: Secondary | ICD-10-CM

## 2015-10-21 DIAGNOSIS — E038 Other specified hypothyroidism: Secondary | ICD-10-CM | POA: Diagnosis not present

## 2015-10-21 DIAGNOSIS — E1159 Type 2 diabetes mellitus with other circulatory complications: Secondary | ICD-10-CM

## 2015-10-21 DIAGNOSIS — I10 Essential (primary) hypertension: Secondary | ICD-10-CM | POA: Diagnosis not present

## 2015-10-21 DIAGNOSIS — I25119 Atherosclerotic heart disease of native coronary artery with unspecified angina pectoris: Secondary | ICD-10-CM

## 2015-10-21 MED ORDER — INSULIN LISPRO 200 UNIT/ML ~~LOC~~ SOPN: 25.0000 [IU] | PEN_INJECTOR | Freq: Three times a day (TID) | SUBCUTANEOUS | 0 refills | Status: DC

## 2015-10-21 MED ORDER — INSULIN REGULAR HUMAN (CONC) 500 UNIT/ML ~~LOC~~ SOPN: 50.0000 [IU] | PEN_INJECTOR | Freq: Three times a day (TID) | SUBCUTANEOUS | 2 refills | Status: DC

## 2015-10-21 NOTE — Patient Instructions (Signed)

## 2015-10-21 NOTE — Progress Notes (Signed)
Subjective:    Patient ID: Paul Baker, male    DOB: 01-Jul-1950, PCP [name], MD   Past Medical History:  Diagnosis Date  . Arthritis   . Asthma   . Colon polyp   . Coronary atherosclerosis of native coronary artery    Diseased nondominant RCA  . DVT (deep venous thrombosis) ([place]) 2005   Right arm  . Essential hypertension, benign   . Headache   . History of transfusion   . Hypersomnia    CPAP of 16 cm, diagnosed with AHI of 60 in 2012,[place] 21- narcolepsy?  [name] Hypothyroidism   . Mixed hyperlipidemia   . Morbid obesity ([place])   . MRSA (methicillin resistant staph aureus) culture positive    08/2012  . Narcolepsy   . OSA (obstructive sleep apnea)   . Pneumonia   . Psoriasis   . Pulmonary embolism ([place]) 2004  . RLS (restless legs syndrome)   . Rotator cuff disorder    Left  . Septic arthritis of knee, left ([place])   . Skin cancer, basal cell   . Type 2 diabetes mellitus ([place])    Past Surgical History:  Procedure Laterality Date  . BACK SURGERY    . CATARACT EXTRACTION Bilateral   . COLONOSCOPY    . EYE SURGERY Left 2016   laser to left eye  . HIP SURGERY     bone removed from both sides of hip  . KNEE ARTHROTOMY Right 12/04/2014   Procedure: KNEE ARTHROTOMY PATELLA LIGAMENT RECONSTRUSION AND REPAIR RIGHT KNEE;  Surgeon: Paralee Cancel, MD;  Location: [facility];  Service: Orthopedics;  Laterality: Right;  . KNEE SURGERY     X 25 TIMES  . LUMBAR DISC SURGERY     Left L3, L4, L5 discecotomy with decompression of L4 root  . TONSILLECTOMY    . TOTAL KNEE ARTHROPLASTY  2003   LEFT  . TOTAL KNEE ARTHROPLASTY Right 03/23/2014   Procedure: RIGHT TOTAL KNEE ARTHROPLASTY AND REMOVAL RIGHT TIBIAL  DEEP IMPLANT STAPLE;  Surgeon: Paralee Cancel, MD;  Location: [facility];  Service: Orthopedics;  Laterality: Right;  . TOTAL KNEE REVISION  2005   LEFT  . WRIST SURGERY     Social History   Social History  . Marital status: Married    Spouse name: Toney Reil  . Number of  children: 2  . Years of education: college   Occupational History  . Disabled   .  Unemployed   Social History Main Topics  . Smoking status: Former Smoker    Types: Cigarettes    Start date: 06/19/1967    Quit date: 01/03/1995  . Smokeless tobacco: Never Used  . Alcohol use No     Comment: quit drinking in 07/86  . Drug use: No  . Sexual activity: Yes    Partners: Female   Other Topics Concern  . None   Social History Narrative    65 year old, right-handed, caucasian male with a past medical history of obesity, hypertension, hyperlipidemia, diabetes, obstructive sleep apnea, presenting with frequent nighttime awakenings, excessive daytime sleepiness, also transient confusional episodes.RLS and one beosity, OSA on CPAP with AHI of 3.2 and  setting of 16 cm water , [facility] .   Outpatient Encounter Prescriptions as of 10/21/2015  Medication Sig  . albuterol (VENTOLIN HFA) 108 (90 Base) MCG/ACT inhaler Inhale 2 puffs into the lungs 4 (four) times daily as needed for wheezing or shortness of breath.  [name] amLODipine (NORVASC) 5 MG tablet  TAKE ONE TABLET BY MOUTH ONCE DAILY.  [name] B-D ULTRAFINE III SHORT PEN 31G X 8 MM MISC USING FIVE TIMES DAILY.  . clobetasol (TEMOVATE) 0.05 % external solution Apply 1 application topically daily as needed (irritation.).   [name] clonazePAM (KLONOPIN) 1 MG tablet TAKE (1) TABLET BY MOUTH AT BEDTIME FOR RESTLESS LEGS.  . fenofibrate 160 MG tablet TAKE (1) TABLET BY MOUTH AT BEDTIME.  . furosemide (LASIX) 40 MG tablet Take 40 mg by mouth every other day.   . Insulin Lispro (HUMALOG KWIKPEN) 200 UNIT/ML SOPN Inject 25-31 Units into the skin 3 (three) times daily with meals.  . insulin regular human CONCENTRATED (HUMULIN R U-500 KWIKPEN) 500 UNIT/ML kwikpen Inject 50 Units into the skin 3 (three) times daily with meals.  . isosorbide mononitrate (IMDUR) 60 MG 24 hr tablet TAKE (1/2) TABLET BY MOUTH TWICE DAILY.  [name] levothyroxine (SYNTHROID, LEVOTHROID) 125 MCG  tablet TAKE (1) TABLET BY MOUTH EACH MORNING BEFORE BREAKFAST.  [name] loratadine (CLARITIN) 10 MG tablet Take 10 mg by mouth at bedtime as needed for allergies.   [name] losartan (COZAAR) 50 MG tablet Take 50 mg by mouth at bedtime. Reported on 06/18/2015  . metFORMIN (GLUCOPHAGE) 500 MG tablet TAKE 2 TABLETS EACH MORNING, 1 TABLET AT NOON, AND 2 TABLETS EVERY EVENING. (Patient taking differently: TAKE 1 TABLET EACH MORNING, 1 TABLET EVERY EVENING.)  . metoprolol succinate (TOPROL-XL) 50 MG 24 hr tablet TAKE (1) TABLET BY MOUTH TWICE DAILY.  . modafinil (PROVIGIL) 200 MG tablet Take 1 tablet (200 mg total) by mouth daily.  . nitroGLYCERIN (NITROSTAT) 0.4 MG SL tablet DISSOLVE 1 TABLET UNDER TONGUE EVERY 5 MINUTES UP TO 15 MIN FOR CHESTPAIN. IF NO RELIEF CALL 911.  [name] ONE TOUCH ULTRA TEST test strip TEST BLOOD SUGAR UP TO 4 TIMES DAILY.  [name] ONETOUCH DELICA LANCETS [id number] MISC USE AS DIRECTED TO TEST BLOOD SUGAR 4 TIMES DAILY.  . pravastatin (PRAVACHOL) 20 MG tablet Take 1 tablet (20 mg total) by mouth daily.  [name] rOPINIRole (REQUIP) 4 MG tablet TAKE 0.5 TABLET BY MOUTH at lunch time and 1.5 tablets at bedtime.  . sodium chloride (OCEAN) 0.65 % SOLN nasal spray Place 1-2 sprays into both nostrils daily as needed for congestion.  . Testosterone Cypionate & Prop 200-20 MG/ML SOLN Inject 1 mL into the muscle every 14 (fourteen) days.  . Vitamin D, Ergocalciferol, (DRISDOL) 50000 units CAPS capsule TAKE 1 CAPSULE BY MOUTH ONCE A WEEK.  . [DISCONTINUED] HUMALOG KWIKPEN 200 UNIT/ML SOPN INJECT UP TO 31 UNITS THREE TIMES DAILY WITH MEALS AS DIRECTED.  . [DISCONTINUED] Insulin Detemir (LEVEMIR FLEXTOUCH) 100 UNIT/ML Pen Inject 100 Units into the skin at bedtime.  . [DISCONTINUED] losartan (COZAAR) 100 MG tablet TAKE ONE TABLET BY MOUTH ONCE DAILY.   No facility-administered encounter medications on file as of 10/21/2015.    ALLERGIES: Allergies  Allergen Reactions  . Cardizem [Diltiazem Hcl]     Edema   . Cardura  [Doxazosin Mesylate]     Side effects  . Lipitor [Atorvastatin] Other (See Comments)    Leg cramps  . Paxil [Paroxetine Hcl]     Side effects   . Adhesive [Tape] Rash  . Codeine Rash and Other (See Comments)    Headache    VACCINATION STATUS: Immunization History  Administered Date(s) Administered  . Influenza Split 09/18/2012  . Influenza,inj,Quad PF,36+ Mos 10/13/2013, 10/06/2014  . Pneumococcal Polysaccharide-23 10/02/2004, 10/03/2011  . Td 05/24/2006    Diabetes  He presents for  his follow-up diabetic visit. He has type 2 diabetes mellitus. Onset time: Was diagnosed at approximate age of 88 years. His disease course has been worsening. There are no hypoglycemic associated symptoms. Pertinent negatives for hypoglycemia include no confusion, headaches, pallor or seizures. Associated symptoms include polydipsia and polyuria. Pertinent negatives for diabetes include no chest pain, no fatigue, no polyphagia and no weakness. There are no hypoglycemic complications. Symptoms are worsening. Diabetic complications include heart disease. Risk factors for coronary artery disease include diabetes mellitus, dyslipidemia, male sex, obesity, sedentary lifestyle and tobacco exposure. Current diabetic treatment includes intensive insulin program. He is compliant with treatment most of the time. His weight is increasing steadily. He is following a generally unhealthy diet. He has had a previous visit with a dietitian. He never participates in exercise. His home blood glucose trend is fluctuating minimally. His breakfast blood glucose range is generally 180-200 mg/dl. His lunch blood glucose range is generally 180-200 mg/dl. His dinner blood glucose range is generally 180-200 mg/dl. His overall blood glucose range is 180-200 mg/dl. An ACE inhibitor/angiotensin II receptor blocker is being taken. He sees a podiatrist.Eye exam is current.  Hyperlipidemia  This is a chronic problem. The current episode started  more than 1 year ago. Pertinent negatives include no chest pain, myalgias or shortness of breath. Current antihyperlipidemic treatment includes statins. Risk factors for coronary artery disease include dyslipidemia, diabetes mellitus, hypertension, male sex, obesity and a sedentary lifestyle.  Hypertension  This is a chronic problem. The current episode started more than 1 year ago. Pertinent negatives include no chest pain, headaches, neck pain, palpitations or shortness of breath. Past treatments include angiotensin blockers. Hypertensive end-organ damage includes CAD/MI and a thyroid problem.  Thyroid Problem  Presents for follow-up visit. Patient reports no constipation, diarrhea, fatigue or palpitations. The symptoms have been improving. Past treatments include levothyroxine. His past medical history is significant for hyperlipidemia.     Review of Systems  Constitutional: Negative for fatigue and unexpected weight change.  HENT: Negative for dental problem, mouth sores and trouble swallowing.   Eyes: Negative for visual disturbance.  Respiratory: Negative for cough, choking, chest tightness, shortness of breath and wheezing.   Cardiovascular: Negative for chest pain, palpitations and leg swelling.  Gastrointestinal: Negative for abdominal distention, abdominal pain, constipation, diarrhea, nausea and vomiting.  Endocrine: Positive for polydipsia and polyuria. Negative for polyphagia.  Genitourinary: Negative for dysuria, flank pain, hematuria and urgency.  Musculoskeletal: Negative for back pain, gait problem, myalgias and neck pain.  Skin: Negative for pallor, rash and wound.  Neurological: Negative for seizures, syncope, weakness, numbness and headaches.  Psychiatric/Behavioral: Negative.  Negative for confusion and dysphoric mood.    Objective:    BP 136/85   Pulse 65   Ht 5\' 5"  (1.651 m)   Wt 213 lb (96.6 kg)   BMI 35.45 kg/m   Wt Readings from Last 3 Encounters:  10/21/15  213 lb (96.6 kg)  09/21/15 202 lb 12.8 oz (92 kg)  09/09/15 212 lb (96.2 kg)    Physical Exam  Constitutional: He is oriented to person, place, and time. He appears well-developed and well-nourished. He is cooperative. No distress.  HENT:  Head: Normocephalic and atraumatic.  Eyes: EOM are normal.  Neck: Normal range of motion. Neck supple. No tracheal deviation present. No thyromegaly present.  Cardiovascular: Normal rate, S1 normal, S2 normal and normal heart sounds.  Exam reveals no gallop.   No murmur heard. Pulses:      Dorsalis pedis  pulses are 1+ on the right side, and 1+ on the left side.       Posterior tibial pulses are 1+ on the right side, and 1+ on the left side.  Pulmonary/Chest: Breath sounds normal. No respiratory distress. He has no wheezes.  Abdominal: Soft. Bowel sounds are normal. He exhibits no distension. There is no tenderness. There is no guarding and no CVA tenderness.  Musculoskeletal: He exhibits no edema.       Right shoulder: He exhibits no swelling and no deformity.  Neurological: He is alert and oriented to person, place, and time. He has normal strength and normal reflexes. No cranial nerve deficit or sensory deficit. Gait normal.  Skin: Skin is warm and dry. No rash noted. No cyanosis. Nails show no clubbing.  Psychiatric: He has a normal mood and affect. His speech is normal and behavior is normal. Judgment and thought content normal. Cognition and memory are normal.    Results for orders placed or performed in visit on 10/12/15  COMPLETE METABOLIC PANEL WITH GFR  Result Value Ref Range   Sodium 139 135 - 146 mmol/L   Potassium 4.6 3.5 - 5.3 mmol/L   Chloride 104 98 - 110 mmol/L   CO2 27 20 - 31 mmol/L   Glucose, Bld 135 (H) 65 - 99 mg/dL   BUN 20 7 - 25 mg/dL   Creat 1.00 0.70 - 1.25 mg/dL   Total Bilirubin 0.4 0.2 - 1.2 mg/dL   Alkaline Phosphatase 43 40 - 115 U/L   AST 69 (H) 10 - 35 U/L   ALT 30 9 - 46 U/L   Total Protein 6.7 6.1 - 8.1 g/dL    Albumin 4.1 3.6 - 5.1 g/dL   Calcium 9.6 8.6 - 10.3 mg/dL   GFR, Est African American >89 >=60 mL/min   GFR, Est Non African American 79 >=60 mL/min  Hemoglobin A1c  Result Value Ref Range   Hgb A1c MFr Bld 9.3 (H) <5.7 %   Mean Plasma Glucose 220 mg/dL   Diabetic Labs (most recent): Lab Results  Component Value Date   HGBA1C 9.3 (H) 10/12/2015   HGBA1C 8.8 (H) 06/21/2015   HGBA1C 9.2 (H) 02/22/2015   Lipid profile (most recent): Lab Results  Component Value Date   TRIG 77 08/19/2015   CHOL 148 08/19/2015     Assessment & Plan:   1. Uncontrolled type 2 diabetes mellitus with other circulatory complication, with long-term current use of insulin (HCC)   -His diabetes is  complicated by ordinary artery disease and patient remains at a high risk for more acute and chronic complications of diabetes which include CAD, CVA, CKD, retinopathy, and neuropathy. These are all discussed in detail with the patient.  Patient came with Fluctuating and above target glucose profile even  on a  largeer dose of insulin, and  recent A1c increasing to 9.3% from 8.8%.  This is largely due to his dietary indiscretion.  Glucose logs and insulin administration records pertaining to this visit,  to be scanned into patient's records.  Recent labs reviewed.   - I have re-counseled the patient on diet management and weight loss  by adopting a carbohydrate restricted / protein rich  Diet.  - Suggestion is made for patient to avoid simple carbohydrates   from their diet including Cakes , Desserts, Ice Cream,  Soda (  diet and regular) , Sweet Tea , Candies,  Chips, Cookies, Artificial Sweeteners,   and "Sugar-free" Products .  This  will help patient to have stable blood glucose profile and potentially avoid unintended  Weight gain.  - Patient is advised to stick to a routine mealtimes to eat 3 meals  a day and avoid unnecessary snacks ( to snack only to correct hypoglycemia).  - The patient  has been   scheduled with Jearld Fenton, RDN, CDE for individualized DM education.  - I have approached patient with the following individualized plan to manage diabetes and patient agrees.  - He will be considered for U500 insulin after he uses up his unavailable supplies. -In the meantime, I advised him to Continue Levemir 100 units daily at bedtime , and Humalog 25 units TIDAC plus SSI associated with monitoring of BG ac and hs.   -Patient is encouraged to call clinic for blood glucose levels less than 70 or above 300 mg /dl. - I will continue low-dose metformin 500 mg by mouth twice a day. -  he was informed by his insurance that they would not pay for [facility]. - Patient will be considered for incretin therapy as appropriate next visit. - Patient specific target  for A1c; LDL, HDL, Triglycerides, and  Waist Circumference were discussed in detail.  2) BP/HTN: Controlled. Continue current medications including ACEI/ARB. 3) Lipids/HPL:  continue statins. 4)  Weight/Diet: CDE consult in progress, exercise, and carbohydrates information provided.  5)Other specified hypothyroidism - Continue levothyroxine  125 g by mouth daily before breakfast.  - We discussed about correct intake of levothyroxine, at fasting, with water, separated by at least 30 minutes from breakfast, and separated by more than 4 hours from calcium, iron, multivitamins, acid reflux medications (PPIs). -Patient is made aware of the fact that thyroid hormone replacement is needed for life, dose to be adjusted by periodic monitoring of thyroid function tests.  6) Chronic Care/Health Maintenance:  -Patient is on ACEI/ARB and Statin medications and encouraged to continue to follow up with Ophthalmology, Podiatrist at least yearly or according to recommendations, and advised to  stay away from smoking. I have recommended yearly flu vaccine and pneumonia vaccination at least every 5 years; moderate intensity exercise for up to 150 minutes  weekly; and  sleep for at least 7 hours a day.  - 25 minutes of time was spent on the care of this patient , 50% of which was applied for counseling on diabetes complications and their preventions.  - I advised patient to maintain close follow up with [name], MD for primary care needs.  Patient is asked to bring meter and  blood glucose logs during their next visit.   Follow up plan: -Return in about 4 weeks (around 11/18/2015) for follow up with meter and logs- no labs.  Glade Lloyd, MD Phone: [phone]  Fax: [phone]   10/21/2015, 4:52 PM

## 2015-10-22 DIAGNOSIS — E291 Testicular hypofunction: Secondary | ICD-10-CM | POA: Diagnosis not present

## 2015-11-05 DIAGNOSIS — E291 Testicular hypofunction: Secondary | ICD-10-CM | POA: Diagnosis not present

## 2015-11-15 ENCOUNTER — Ambulatory Visit: Payer: Medicare Other | Admitting: Adult Health

## 2015-11-18 ENCOUNTER — Encounter: Payer: Self-pay | Admitting: "Endocrinology

## 2015-11-18 ENCOUNTER — Ambulatory Visit (INDEPENDENT_AMBULATORY_CARE_PROVIDER_SITE_OTHER): Payer: Medicare Other | Admitting: "Endocrinology

## 2015-11-18 VITALS — BP 151/83 | HR 63 | Ht 65.0 in | Wt 212.0 lb

## 2015-11-18 DIAGNOSIS — E038 Other specified hypothyroidism: Secondary | ICD-10-CM | POA: Diagnosis not present

## 2015-11-18 DIAGNOSIS — E1159 Type 2 diabetes mellitus with other circulatory complications: Secondary | ICD-10-CM | POA: Diagnosis not present

## 2015-11-18 DIAGNOSIS — I10 Essential (primary) hypertension: Secondary | ICD-10-CM

## 2015-11-18 DIAGNOSIS — E782 Mixed hyperlipidemia: Secondary | ICD-10-CM

## 2015-11-18 DIAGNOSIS — I25119 Atherosclerotic heart disease of native coronary artery with unspecified angina pectoris: Secondary | ICD-10-CM

## 2015-11-18 MED ORDER — LEVOTHYROXINE SODIUM 137 MCG PO TABS: 137.0000 ug | ORAL_TABLET | Freq: Every day | ORAL | 6 refills | Status: DC

## 2015-11-18 MED ORDER — INSULIN REGULAR HUMAN (CONC) 500 UNIT/ML ~~LOC~~ SOPN: 50.0000 [IU] | PEN_INJECTOR | Freq: Three times a day (TID) | SUBCUTANEOUS | 2 refills | Status: DC

## 2015-11-18 NOTE — Patient Instructions (Signed)

## 2015-11-18 NOTE — Progress Notes (Signed)
Subjective:    Patient ID: Paul Baker, male    DOB: [birth year], PCP [name], MD   Past Medical History:  Diagnosis Date  . Arthritis   . Asthma   . Colon polyp   . Coronary atherosclerosis of native coronary artery    Diseased nondominant RCA  . DVT (deep venous thrombosis) ([place]) 2005   Right arm  . Essential hypertension, benign   . Headache   . History of transfusion   . Hypersomnia    CPAP of 16 cm, diagnosed with AHI of 60 in 2012,ep[place] 21- narcolepsy?  [name] Hypothyroidism   . Mixed hyperlipidemia   . Morbid obesity ([place])   . MRSA (methicillin resistant staph aureus) culture positive    08/2012  . Narcolepsy   . OSA (obstructive sleep apnea)   . Pneumonia   . Psoriasis   . Pulmonary embolism ([place]) 2004  . RLS (restless legs syndrome)   . Rotator cuff disorder    Left  . Septic arthritis of knee, left ([place])   . Skin cancer, basal cell   . Type 2 diabetes mellitus ([place])    Past Surgical History:  Procedure Laterality Date  . BACK SURGERY    . CATARACT EXTRACTION Bilateral   . COLONOSCOPY    . EYE SURGERY Left 2016   laser to left eye  . HIP SURGERY     bone removed from both sides of hip  . KNEE ARTHROTOMY Right 12/04/2014   Procedure: KNEE ARTHROTOMY PATELLA LIGAMENT RECONSTRUSION AND REPAIR RIGHT KNEE;  Surgeon: Paralee Cancel, MD;  Location: [facility];  Service: Orthopedics;  Laterality: Right;  . KNEE SURGERY     X 25 TIMES  . LUMBAR DISC SURGERY     Left L3, L4, L5 discecotomy with decompression of L4 root  . TONSILLECTOMY    . TOTAL KNEE ARTHROPLASTY  2003   LEFT  . TOTAL KNEE ARTHROPLASTY Right 03/23/2014   Procedure: RIGHT TOTAL KNEE ARTHROPLASTY AND REMOVAL RIGHT TIBIAL  DEEP IMPLANT STAPLE;  Surgeon: Paralee Cancel, MD;  Location: [facility];  Service: Orthopedics;  Laterality: Right;  . TOTAL KNEE REVISION  2005   LEFT  . WRIST SURGERY     Social History   Social History  . Marital status: Married    Spouse name: Toney Reil  . Number of  children: 2  . Years of education: college   Occupational History  . Disabled   .  Unemployed   Social History Main Topics  . Smoking status: Former Smoker    Types: Cigarettes    Start date: 06/19/1967    Quit date: 01/03/1995  . Smokeless tobacco: Never Used  . Alcohol use No     Comment: quit drinking in 07/86  . Drug use: No  . Sexual activity: Yes    Partners: Female   Other Topics Concern  . None   Social History Narrative    65 year old, right-handed, caucasian male with a past medical history of obesity, hypertension, hyperlipidemia, diabetes, obstructive sleep apnea, presenting with frequent nighttime awakenings, excessive daytime sleepiness, also transient confusional episodes.RLS and one beosity, OSA on CPAP with AHI of 3.2 and  setting of 16 cm water , [facility] .   Outpatient Encounter Prescriptions as of 11/18/2015  Medication Sig  . albuterol (VENTOLIN HFA) 108 (90 Base) MCG/ACT inhaler Inhale 2 puffs into the lungs 4 (four) times daily as needed for wheezing or shortness of breath.  [name] amLODipine (NORVASC) 5 MG tablet  TAKE ONE TABLET BY MOUTH ONCE DAILY.  [name] B-D ULTRAFINE III SHORT PEN 31G X 8 MM MISC USING FIVE TIMES DAILY.  . clobetasol (TEMOVATE) 0.05 % external solution Apply 1 application topically daily as needed (irritation.).   [name] clonazePAM (KLONOPIN) 1 MG tablet TAKE (1) TABLET BY MOUTH AT BEDTIME FOR RESTLESS LEGS.  . fenofibrate 160 MG tablet TAKE (1) TABLET BY MOUTH AT BEDTIME.  . furosemide (LASIX) 40 MG tablet Take 40 mg by mouth every other day.   . insulin regular human CONCENTRATED (HUMULIN R U-500 KWIKPEN) 500 UNIT/ML kwikpen Inject 50 Units into the skin 3 (three) times daily with meals.  . isosorbide mononitrate (IMDUR) 60 MG 24 hr tablet TAKE (1/2) TABLET BY MOUTH TWICE DAILY.  [name] levothyroxine (SYNTHROID, LEVOTHROID) 137 MCG tablet Take 1 tablet (137 mcg total) by mouth daily before breakfast.  . loratadine (CLARITIN) 10 MG tablet Take 10 mg  by mouth at bedtime as needed for allergies.   [name] losartan (COZAAR) 50 MG tablet Take 50 mg by mouth at bedtime. Reported on 06/18/2015  . metFORMIN (GLUCOPHAGE) 500 MG tablet TAKE 2 TABLETS EACH MORNING, 1 TABLET AT NOON, AND 2 TABLETS EVERY EVENING. (Patient taking differently: TAKE 1 TABLET EACH MORNING, 1 TABLET EVERY EVENING.)  . metoprolol succinate (TOPROL-XL) 50 MG 24 hr tablet TAKE (1) TABLET BY MOUTH TWICE DAILY.  . modafinil (PROVIGIL) 200 MG tablet Take 1 tablet (200 mg total) by mouth daily.  . nitroGLYCERIN (NITROSTAT) 0.4 MG SL tablet DISSOLVE 1 TABLET UNDER TONGUE EVERY 5 MINUTES UP TO 15 MIN FOR CHESTPAIN. IF NO RELIEF CALL 911.  [name] ONE TOUCH ULTRA TEST test strip TEST BLOOD SUGAR UP TO 4 TIMES DAILY.  [name] ONETOUCH DELICA LANCETS 99991111 MISC USE AS DIRECTED TO TEST BLOOD SUGAR 4 TIMES DAILY.  . pravastatin (PRAVACHOL) 20 MG tablet Take 1 tablet (20 mg total) by mouth daily.  [name] rOPINIRole (REQUIP) 4 MG tablet TAKE 0.5 TABLET BY MOUTH at lunch time and 1.5 tablets at bedtime.  . sodium chloride (OCEAN) 0.65 % SOLN nasal spray Place 1-2 sprays into both nostrils daily as needed for congestion.  . Testosterone Cypionate & Prop 200-20 MG/ML SOLN Inject 1 mL into the muscle every 14 (fourteen) days.  . Vitamin D, Ergocalciferol, (DRISDOL) 50000 units CAPS capsule TAKE 1 CAPSULE BY MOUTH ONCE A WEEK.  . [DISCONTINUED] Insulin Lispro (HUMALOG KWIKPEN) 200 UNIT/ML SOPN Inject 25-31 Units into the skin 3 (three) times daily with meals.  . [DISCONTINUED] insulin regular human CONCENTRATED (HUMULIN R U-500 KWIKPEN) 500 UNIT/ML kwikpen Inject 50 Units into the skin 3 (three) times daily with meals. (Patient not taking: Reported on 11/18/2015)  . [DISCONTINUED] levothyroxine (SYNTHROID, LEVOTHROID) 125 MCG tablet TAKE (1) TABLET BY MOUTH EACH MORNING BEFORE BREAKFAST.   No facility-administered encounter medications on file as of 11/18/2015.    ALLERGIES: Allergies  Allergen Reactions  . Cardizem  [Diltiazem Hcl]     Edema   . Cardura [Doxazosin Mesylate]     Side effects  . Lipitor [Atorvastatin] Other (See Comments)    Leg cramps  . Paxil [Paroxetine Hcl]     Side effects   . Adhesive [Tape] Rash  . Codeine Rash and Other (See Comments)    Headache    VACCINATION STATUS: Immunization History  Administered Date(s) Administered  . Influenza Split 09/18/2012  . Influenza,inj,Quad PF,36+ Mos 10/13/2013, 10/06/2014  . Pneumococcal Polysaccharide-23 10/02/2004, 10/03/2011  . Td 05/24/2006    Diabetes  He presents for  his follow-up diabetic visit. He has type 2 diabetes mellitus. Onset time: Was diagnosed at approximate age of 64 years. His disease course has been worsening. There are no hypoglycemic associated symptoms. Pertinent negatives for hypoglycemia include no confusion, headaches, pallor or seizures. Associated symptoms include polydipsia and polyuria. Pertinent negatives for diabetes include no chest pain, no fatigue, no polyphagia and no weakness. There are no hypoglycemic complications. Symptoms are worsening. Diabetic complications include heart disease. Risk factors for coronary artery disease include diabetes mellitus, dyslipidemia, male sex, obesity, sedentary lifestyle and tobacco exposure. Current diabetic treatment includes intensive insulin program. He is compliant with treatment most of the time. His weight is stable. He is following a generally unhealthy diet. He has had a previous visit with a dietitian. He never participates in exercise. His home blood glucose trend is fluctuating minimally. His breakfast blood glucose range is generally 180-200 mg/dl. His lunch blood glucose range is generally 180-200 mg/dl. His dinner blood glucose range is generally 180-200 mg/dl. His overall blood glucose range is 180-200 mg/dl. An ACE inhibitor/angiotensin II receptor blocker is being taken. He sees a podiatrist.Eye exam is current.  Hyperlipidemia  This is a chronic problem.  The current episode started more than 1 year ago. Pertinent negatives include no chest pain, myalgias or shortness of breath. Current antihyperlipidemic treatment includes statins. Risk factors for coronary artery disease include dyslipidemia, diabetes mellitus, hypertension, male sex, obesity and a sedentary lifestyle.  Hypertension  This is a chronic problem. The current episode started more than 1 year ago. Pertinent negatives include no chest pain, headaches, neck pain, palpitations or shortness of breath. Past treatments include angiotensin blockers. Hypertensive end-organ damage includes CAD/MI and a thyroid problem.  Thyroid Problem  Presents for follow-up visit. Patient reports no constipation, diarrhea, fatigue or palpitations. The symptoms have been improving. Past treatments include levothyroxine. His past medical history is significant for hyperlipidemia.     Review of Systems  Constitutional: Negative for fatigue and unexpected weight change.  HENT: Negative for dental problem, mouth sores and trouble swallowing.   Eyes: Negative for visual disturbance.  Respiratory: Negative for cough, choking, chest tightness, shortness of breath and wheezing.   Cardiovascular: Negative for chest pain, palpitations and leg swelling.  Gastrointestinal: Negative for abdominal distention, abdominal pain, constipation, diarrhea, nausea and vomiting.  Endocrine: Positive for polydipsia and polyuria. Negative for polyphagia.  Genitourinary: Negative for dysuria, flank pain, hematuria and urgency.  Musculoskeletal: Negative for back pain, gait problem, myalgias and neck pain.  Skin: Negative for pallor, rash and wound.  Neurological: Negative for seizures, syncope, weakness, numbness and headaches.  Psychiatric/Behavioral: Negative.  Negative for confusion and dysphoric mood.    Objective:    BP (!) 151/83   Pulse 63   Ht 5\' 5"  (1.651 m)   Wt 212 lb (96.2 kg)   BMI 35.28 kg/m   Wt Readings from  Last 3 Encounters:  11/18/15 212 lb (96.2 kg)  10/21/15 213 lb (96.6 kg)  09/21/15 202 lb 12.8 oz (92 kg)    Physical Exam  Constitutional: He is oriented to person, place, and time. He appears well-developed and well-nourished. He is cooperative. No distress.  HENT:  Head: Normocephalic and atraumatic.  Eyes: EOM are normal.  Neck: Normal range of motion. Neck supple. No tracheal deviation present. No thyromegaly present.  Cardiovascular: Normal rate, S1 normal, S2 normal and normal heart sounds.  Exam reveals no gallop.   No murmur heard. Pulses:      Dorsalis pedis  pulses are 1+ on the right side, and 1+ on the left side.       Posterior tibial pulses are 1+ on the right side, and 1+ on the left side.  Pulmonary/Chest: Breath sounds normal. No respiratory distress. He has no wheezes.  Abdominal: Soft. Bowel sounds are normal. He exhibits no distension. There is no tenderness. There is no guarding and no CVA tenderness.  Musculoskeletal: He exhibits no edema.       Right shoulder: He exhibits no swelling and no deformity.  Neurological: He is alert and oriented to person, place, and time. He has normal strength and normal reflexes. No cranial nerve deficit or sensory deficit. Gait normal.  Skin: Skin is warm and dry. No rash noted. No cyanosis. Nails show no clubbing.  Psychiatric: He has a normal mood and affect. His speech is normal and behavior is normal. Judgment and thought content normal. Cognition and memory are normal.    Results for orders placed or performed in visit on 10/12/15  COMPLETE METABOLIC PANEL WITH GFR  Result Value Ref Range   Sodium 139 135 - 146 mmol/L   Potassium 4.6 3.5 - 5.3 mmol/L   Chloride 104 98 - 110 mmol/L   CO2 27 20 - 31 mmol/L   Glucose, Bld 135 (H) 65 - 99 mg/dL   BUN 20 7 - 25 mg/dL   Creat 1.00 0.70 - 1.25 mg/dL   Total Bilirubin 0.4 0.2 - 1.2 mg/dL   Alkaline Phosphatase 43 40 - 115 U/L   AST 69 (H) 10 - 35 U/L   ALT 30 9 - 46 U/L    Total Protein 6.7 6.1 - 8.1 g/dL   Albumin 4.1 3.6 - 5.1 g/dL   Calcium 9.6 8.6 - 10.3 mg/dL   GFR, Est African American >89 >=60 mL/min   GFR, Est Non African American 79 >=60 mL/min  Hemoglobin A1c  Result Value Ref Range   Hgb A1c MFr Bld 9.3 (H) <5.7 %   Mean Plasma Glucose 220 mg/dL   Diabetic Labs (most recent): Lab Results  Component Value Date   HGBA1C 9.3 (H) 10/12/2015   HGBA1C 8.8 (H) 06/21/2015   HGBA1C 9.2 (H) 02/22/2015   Lipid profile (most recent): Lab Results  Component Value Date   TRIG 77 08/19/2015   CHOL 148 08/19/2015     Assessment & Plan:   1. Uncontrolled type 2 diabetes mellitus with other circulatory complication, with long-term current use of insulin (HCC)   -His diabetes is  complicated by ordinary artery disease and patient remains at a high risk for more acute and chronic complications of diabetes which include CAD, CVA, CKD, retinopathy, and neuropathy. These are all discussed in detail with the patient.  Patient came with Fluctuating and above target glucose profile even  on a  largeer dose of insulin, and  recent A1c increasing to 9.3% from 8.8%.  This is largely due to his dietary indiscretion.  Glucose logs and insulin administration records pertaining to this visit,  to be scanned into patient's records.  Recent labs reviewed.   - I have re-counseled the patient on diet management and weight loss  by adopting a carbohydrate restricted / protein rich  Diet.  - Suggestion is made for patient to avoid simple carbohydrates   from their diet including Cakes , Desserts, Ice Cream,  Soda (  diet and regular) , Sweet Tea , Candies,  Chips, Cookies, Artificial Sweeteners,   and "Sugar-free" Products .  This  will help patient to have stable blood glucose profile and potentially avoid unintended  Weight gain.  - Patient is advised to stick to a routine mealtimes to eat 3 meals  a day and avoid unnecessary snacks ( to snack only to correct  hypoglycemia).  - The patient  has been  scheduled with Jearld Fenton, RDN, CDE for individualized DM education.  - I have approached patient with the following individualized plan to manage diabetes and patient agrees.  - He will be switched to U500 insulin, since he is requiring more than 200 units of U100 insulin daily. - I advised him to discontinue Levemir and Humalog if he has any leftover supplies at home.  - I will initiate Humulin U500 50 units with breakfast, lunch, and supper when pre-meal blood glucose is above 90 mg/dL. - He is advised to continue strict monitoring of blood glucose before meals and at bedtime. -Patient is encouraged to call clinic for blood glucose levels less than 70 or above 200 mg /dl x 3. - I will continue low-dose metformin 500 mg by mouth twice a day. - He was informed by his insurance that they would not pay for [facility]. - Patient will be considered for incretin therapy as appropriate next visit. - Patient specific target  for A1c; LDL, HDL, Triglycerides, and  Waist Circumference were discussed in detail.  2) BP/HTN: Controlled. Continue current medications including ACEI/ARB. 3) Lipids/HPL:  continue statins. 4)  Weight/Diet: CDE consult in progress, exercise, and carbohydrates information provided.  5)Other specified hypothyroidism -  He would benefit from slight increase in his levothyroxine. I will prescribe levothyroxine 137 g by mouth every morning. - We discussed about correct intake of levothyroxine, at fasting, with water, separated by at least 30 minutes from breakfast, and separated by more than 4 hours from calcium, iron, multivitamins, acid reflux medications (PPIs). -Patient is made aware of the fact that thyroid hormone replacement is needed for life, dose to be adjusted by periodic monitoring of thyroid function tests.  6) Chronic Care/Health Maintenance:  -Patient is on ACEI/ARB and Statin medications and encouraged to continue to  follow up with Ophthalmology, Podiatrist at least yearly or according to recommendations, and advised to  stay away from smoking. I have recommended yearly flu vaccine and pneumonia vaccination at least every 5 years; moderate intensity exercise for up to 150 minutes weekly; and  sleep for at least 7 hours a day.  - 25 minutes of time was spent on the care of this patient , 50% of which was applied for counseling on diabetes complications and their preventions.  - I advised patient to maintain close follow up with [name], MD for primary care needs.  Patient is asked to bring meter and  blood glucose logs during their next visit.   Follow up plan: -Return in about 9 weeks (around 01/20/2016) for follow up with pre-visit labs, meter, and logs.  Glade Lloyd, MD Phone: [phone]  Fax: [phone]   11/18/2015, 9:29 AM

## 2015-11-19 DIAGNOSIS — E291 Testicular hypofunction: Secondary | ICD-10-CM | POA: Diagnosis not present

## 2015-12-03 DIAGNOSIS — E291 Testicular hypofunction: Secondary | ICD-10-CM | POA: Diagnosis not present

## 2015-12-04 ENCOUNTER — Other Ambulatory Visit: Payer: Self-pay | Admitting: Neurology

## 2015-12-04 ENCOUNTER — Other Ambulatory Visit: Payer: Self-pay | Admitting: Family Medicine

## 2015-12-04 ENCOUNTER — Other Ambulatory Visit: Payer: Self-pay | Admitting: Cardiology

## 2015-12-06 NOTE — Telephone Encounter (Signed)
This patient appears to be under the care of Dr. [name]. Very important that his endocrinologist fills this

## 2015-12-06 NOTE — Telephone Encounter (Signed)
RX due and up to date on appts.

## 2015-12-20 ENCOUNTER — Telehealth: Payer: Self-pay

## 2015-12-20 ENCOUNTER — Other Ambulatory Visit: Payer: Self-pay | Admitting: Family Medicine

## 2015-12-20 NOTE — Telephone Encounter (Signed)
Pt.notified

## 2015-12-20 NOTE — Telephone Encounter (Signed)
Pt states he has had high & low BG readings.   Date Before breakfast Before lunch Before supper Bedtime  12/16 152 [phone]  12/17 59 [phone]  12/18 53 237            Pt taking: U500  50 units TIDAC, MTF 500mg  bid

## 2015-12-20 NOTE — Telephone Encounter (Signed)
Advise patient to continue U500 insulin 50 units with breakfast, 50 units with lunch, but lower to 40 units with supper. He has to make sure he eats breakfast before 9 AM.

## 2015-12-24 DIAGNOSIS — Z029 Encounter for administrative examinations, unspecified: Secondary | ICD-10-CM

## 2015-12-28 ENCOUNTER — Other Ambulatory Visit: Payer: Self-pay | Admitting: "Endocrinology

## 2015-12-31 DIAGNOSIS — E291 Testicular hypofunction: Secondary | ICD-10-CM | POA: Diagnosis not present

## 2016-01-04 DIAGNOSIS — E291 Testicular hypofunction: Secondary | ICD-10-CM | POA: Diagnosis not present

## 2016-01-13 ENCOUNTER — Other Ambulatory Visit: Payer: Self-pay | Admitting: "Endocrinology

## 2016-01-13 DIAGNOSIS — E038 Other specified hypothyroidism: Secondary | ICD-10-CM | POA: Diagnosis not present

## 2016-01-13 DIAGNOSIS — E1159 Type 2 diabetes mellitus with other circulatory complications: Secondary | ICD-10-CM | POA: Diagnosis not present

## 2016-01-13 LAB — COMPREHENSIVE METABOLIC PANEL
ALK PHOS: 35 U/L — AB (ref 40–115)
ALT: 11 U/L (ref 9–46)
AST: 14 U/L (ref 10–35)
Albumin: 4.1 g/dL (ref 3.6–5.1)
BILIRUBIN TOTAL: 0.3 mg/dL (ref 0.2–1.2)
BUN: 29 mg/dL — AB (ref 7–25)
CO2: 25 mmol/L (ref 20–31)
CREATININE: 1.29 mg/dL — AB (ref 0.70–1.25)
Calcium: 9.3 mg/dL (ref 8.6–10.3)
Chloride: 104 mmol/L (ref 98–110)
Glucose, Bld: 345 mg/dL — ABNORMAL HIGH (ref 65–99)
Potassium: 4.6 mmol/L (ref 3.5–5.3)
SODIUM: 136 mmol/L (ref 135–146)
TOTAL PROTEIN: 6.8 g/dL (ref 6.1–8.1)

## 2016-01-13 LAB — T4, FREE: FREE T4: 1.3 ng/dL (ref 0.8–1.8)

## 2016-01-13 LAB — TSH: TSH: 0.33 mIU/L — ABNORMAL LOW (ref 0.40–4.50)

## 2016-01-14 DIAGNOSIS — E291 Testicular hypofunction: Secondary | ICD-10-CM | POA: Diagnosis not present

## 2016-01-14 DIAGNOSIS — R3914 Feeling of incomplete bladder emptying: Secondary | ICD-10-CM | POA: Diagnosis not present

## 2016-01-14 LAB — HEMOGLOBIN A1C
HEMOGLOBIN A1C: 8.7 % — AB (ref <5.7)
Mean Plasma Glucose: 203 mg/dL

## 2016-01-15 ENCOUNTER — Other Ambulatory Visit: Payer: Self-pay | Admitting: Family Medicine

## 2016-01-24 ENCOUNTER — Encounter: Payer: Self-pay | Admitting: Neurology

## 2016-01-24 ENCOUNTER — Encounter: Payer: Self-pay | Admitting: "Endocrinology

## 2016-01-24 ENCOUNTER — Ambulatory Visit (INDEPENDENT_AMBULATORY_CARE_PROVIDER_SITE_OTHER): Payer: Medicare Other | Admitting: "Endocrinology

## 2016-01-24 ENCOUNTER — Ambulatory Visit (INDEPENDENT_AMBULATORY_CARE_PROVIDER_SITE_OTHER): Payer: Medicare Other | Admitting: Neurology

## 2016-01-24 VITALS — BP 128/69 | HR 74 | Ht 65.0 in | Wt 214.0 lb

## 2016-01-24 VITALS — BP 133/76 | HR 67 | Resp 20 | Ht 65.0 in | Wt 209.0 lb

## 2016-01-24 DIAGNOSIS — I10 Essential (primary) hypertension: Secondary | ICD-10-CM | POA: Diagnosis not present

## 2016-01-24 DIAGNOSIS — Z794 Long term (current) use of insulin: Secondary | ICD-10-CM | POA: Diagnosis not present

## 2016-01-24 DIAGNOSIS — E0821 Diabetes mellitus due to underlying condition with diabetic nephropathy: Secondary | ICD-10-CM

## 2016-01-24 DIAGNOSIS — Z9989 Dependence on other enabling machines and devices: Secondary | ICD-10-CM

## 2016-01-24 DIAGNOSIS — E1159 Type 2 diabetes mellitus with other circulatory complications: Secondary | ICD-10-CM | POA: Diagnosis not present

## 2016-01-24 DIAGNOSIS — G2581 Restless legs syndrome: Secondary | ICD-10-CM

## 2016-01-24 DIAGNOSIS — G4733 Obstructive sleep apnea (adult) (pediatric): Secondary | ICD-10-CM

## 2016-01-24 DIAGNOSIS — E6609 Other obesity due to excess calories: Secondary | ICD-10-CM

## 2016-01-24 DIAGNOSIS — E782 Mixed hyperlipidemia: Secondary | ICD-10-CM

## 2016-01-24 DIAGNOSIS — E0865 Diabetes mellitus due to underlying condition with hyperglycemia: Secondary | ICD-10-CM

## 2016-01-24 DIAGNOSIS — IMO0001 Reserved for inherently not codable concepts without codable children: Secondary | ICD-10-CM

## 2016-01-24 DIAGNOSIS — E119 Type 2 diabetes mellitus without complications: Secondary | ICD-10-CM

## 2016-01-24 DIAGNOSIS — E038 Other specified hypothyroidism: Secondary | ICD-10-CM | POA: Diagnosis not present

## 2016-01-24 MED ORDER — ROPINIROLE HCL 4 MG PO TABS: ORAL_TABLET | ORAL | 5 refills | Status: DC

## 2016-01-24 MED ORDER — INSULIN REGULAR HUMAN (CONC) 500 UNIT/ML ~~LOC~~ SOPN
PEN_INJECTOR | SUBCUTANEOUS | 2 refills | Status: DC
Start: 2016-01-24 — End: 2016-03-15

## 2016-01-24 MED ORDER — CLONAZEPAM 1 MG PO TABS: ORAL_TABLET | ORAL | 0 refills | Status: DC

## 2016-01-24 NOTE — Patient Instructions (Signed)

## 2016-01-24 NOTE — Progress Notes (Signed)
Subjective:    Patient ID: Paul Baker, male    DOB: [birth year], PCP [name], MD   Past Medical History:  Diagnosis Date  . Arthritis   . Asthma   . Colon polyp   . Coronary atherosclerosis of native coronary artery    Diseased nondominant RCA  . DVT (deep venous thrombosis) ([place]) 2005   Right arm  . Essential hypertension, benign   . Headache   . History of transfusion   . Hypersomnia    CPAP of 16 cm, diagnosed with AHI of 60 in 2012,[place] 21- narcolepsy?  [name] Hypothyroidism   . Mixed hyperlipidemia   . Morbid obesity ([place])   . MRSA (methicillin resistant staph aureus) culture positive    08/2012  . Narcolepsy   . OSA (obstructive sleep apnea)   . Pneumonia   . Psoriasis   . Pulmonary embolism ([place]) 2004  . RLS (restless legs syndrome)   . Rotator cuff disorder    Left  . Septic arthritis of knee, left ([place])   . Skin cancer, basal cell   . Type 2 diabetes mellitus ([place])    Past Surgical History:  Procedure Laterality Date  . BACK SURGERY    . CATARACT EXTRACTION Bilateral   . COLONOSCOPY    . EYE SURGERY Left 2016   laser to left eye  . HIP SURGERY     bone removed from both sides of hip  . KNEE ARTHROTOMY Right 12/04/2014   Procedure: KNEE ARTHROTOMY PATELLA LIGAMENT RECONSTRUSION AND REPAIR RIGHT KNEE;  Surgeon: Paralee Cancel, MD;  Location: [facility];  Service: Orthopedics;  Laterality: Right;  . KNEE SURGERY     X 25 TIMES  . LUMBAR DISC SURGERY     Left L3, L4, L5 discecotomy with decompression of L4 root  . TONSILLECTOMY    . TOTAL KNEE ARTHROPLASTY  2003   LEFT  . TOTAL KNEE ARTHROPLASTY Right 03/23/2014   Procedure: RIGHT TOTAL KNEE ARTHROPLASTY AND REMOVAL RIGHT TIBIAL  DEEP IMPLANT STAPLE;  Surgeon: Paralee Cancel, MD;  Location: [facility];  Service: Orthopedics;  Laterality: Right;  . TOTAL KNEE REVISION  2005   LEFT  . WRIST SURGERY     Social History   Social History  . Marital status: Married    Spouse name: Toney Reil  . Number of  children: 2  . Years of education: college   Occupational History  . Disabled   .  Unemployed   Social History Main Topics  . Smoking status: Former Smoker    Types: Cigarettes    Start date: 06/19/1967    Quit date: 01/03/1995  . Smokeless tobacco: Never Used  . Alcohol use No     Comment: quit drinking in 07/86  . Drug use: No  . Sexual activity: Yes    Partners: Female   Other Topics Concern  . None   Social History Narrative    66 year old, right-handed, caucasian male with a past medical history of obesity, hypertension, hyperlipidemia, diabetes, obstructive sleep apnea, presenting with frequent nighttime awakenings, excessive daytime sleepiness, also transient confusional episodes.RLS and one beosity, OSA on CPAP with AHI of 3.2 and  setting of 16 cm water , [facility] .   Outpatient Encounter Prescriptions as of 01/24/2016  Medication Sig  . albuterol (VENTOLIN HFA) 108 (90 Base) MCG/ACT inhaler Inhale 2 puffs into the lungs 4 (four) times daily as needed for wheezing or shortness of breath.  [name] amLODipine (NORVASC) 5 MG tablet  TAKE ONE TABLET BY MOUTH ONCE DAILY.  [name] B-D ULTRAFINE III SHORT PEN 31G X 8 MM MISC USING FIVE TIMES DAILY.  . clobetasol (TEMOVATE) 0.05 % external solution Apply 1 application topically daily as needed (irritation.).   [name] clonazePAM (KLONOPIN) 1 MG tablet TAKE (1) TABLET BY MOUTH AT BEDTIME FOR RESTLESS LEGS.  . fenofibrate 160 MG tablet TAKE (1) TABLET BY MOUTH AT BEDTIME.  . furosemide (LASIX) 40 MG tablet Take 40 mg by mouth every other day.   . insulin regular human CONCENTRATED (HUMULIN R U-500 KWIKPEN) 500 UNIT/ML kwikpen Inject 60 units with breakfast, 60 units with lunch, and 45 units with supper when pre-meal blood glucose is above 90 mg/dL.  [name] isosorbide mononitrate (IMDUR) 60 MG 24 hr tablet TAKE (1/2) TABLET BY MOUTH TWICE DAILY.  [name] levothyroxine (SYNTHROID, LEVOTHROID) 137 MCG tablet Take 1 tablet (137 mcg total) by mouth daily before  breakfast.  . loratadine (CLARITIN) 10 MG tablet Take 10 mg by mouth at bedtime as needed for allergies.   [name] losartan (COZAAR) 100 MG tablet TAKE ONE TABLET BY MOUTH ONCE DAILY.  [name] losartan (COZAAR) 50 MG tablet Take 50 mg by mouth at bedtime. Reported on 06/18/2015  . metFORMIN (GLUCOPHAGE) 500 MG tablet TAKE 2 TABLETS EACH MORNING, 1 TABLET AT NOON, AND 2 TABLETS EVERY EVENING. (Patient taking differently: TAKE 1 TABLET EACH MORNING, 1 TABLET EVERY EVENING.)  . metoprolol succinate (TOPROL-XL) 50 MG 24 hr tablet TAKE (1) TABLET BY MOUTH TWICE DAILY.  . modafinil (PROVIGIL) 200 MG tablet Take 1 tablet (200 mg total) by mouth daily.  . nitroGLYCERIN (NITROSTAT) 0.4 MG SL tablet DISSOLVE 1 TABLET UNDER TONGUE EVERY 5 MINUTES UP TO 15 MIN FOR CHESTPAIN. IF NO RELIEF CALL 911.  [name] ONE TOUCH ULTRA TEST test strip TEST BLOOD SUGAR UP TO 4 TIMES DAILY.  [name] ONETOUCH DELICA LANCETS 99991111 MISC USE AS DIRECTED TO TEST BLOOD SUGAR 4 TIMES DAILY.  . pravastatin (PRAVACHOL) 20 MG tablet TAKE ONE TABLET BY MOUTH ONCE DAILY.  [name] rOPINIRole (REQUIP) 4 MG tablet TAKE 0.5 TABLET BY MOUTH at lunch time and 1.5 tablets at bedtime.  . sodium chloride (OCEAN) 0.65 % SOLN nasal spray Place 1-2 sprays into both nostrils daily as needed for congestion.  . Testosterone Cypionate & Prop 200-20 MG/ML SOLN Inject 1 mL into the muscle every 14 (fourteen) days.  . Vitamin D, Ergocalciferol, (DRISDOL) 50000 units CAPS capsule TAKE 1 CAPSULE BY MOUTH ONCE A WEEK.  . [DISCONTINUED] insulin regular human CONCENTRATED (HUMULIN R U-500 KWIKPEN) 500 UNIT/ML kwikpen Inject 50 Units into the skin 3 (three) times daily with meals.   No facility-administered encounter medications on file as of 01/24/2016.    ALLERGIES: Allergies  Allergen Reactions  . Cardizem [Diltiazem Hcl]     Edema   . Cardura [Doxazosin Mesylate]     Side effects  . Lipitor [Atorvastatin] Other (See Comments)    Leg cramps  . Paxil [Paroxetine Hcl]     Side  effects   . Adhesive [Tape] Rash  . Codeine Rash and Other (See Comments)    Headache    VACCINATION STATUS: Immunization History  Administered Date(s) Administered  . Influenza Split 09/18/2012  . Influenza,inj,Quad PF,36+ Mos 10/13/2013, 10/06/2014  . Influenza-Unspecified 12/01/2015  . Pneumococcal Polysaccharide-23 10/02/2004, 10/03/2011  . Td 05/24/2006    Diabetes  He presents for his follow-up diabetic visit. He has type 2 diabetes mellitus. Onset time: Was diagnosed at approximate age of 45 years.  His disease course has been improving. There are no hypoglycemic associated symptoms. Pertinent negatives for hypoglycemia include no confusion, headaches, pallor or seizures. Associated symptoms include polydipsia and polyuria. Pertinent negatives for diabetes include no chest pain, no fatigue, no polyphagia and no weakness. There are no hypoglycemic complications. Symptoms are improving. Diabetic complications include heart disease. Risk factors for coronary artery disease include diabetes mellitus, dyslipidemia, male sex, obesity, sedentary lifestyle and tobacco exposure. Current diabetic treatment includes intensive insulin program. He is compliant with treatment most of the time. His weight is stable. He is following a generally unhealthy diet. He has had a previous visit with a dietitian. He never participates in exercise. His home blood glucose trend is fluctuating minimally. His breakfast blood glucose range is generally 180-200 mg/dl. His lunch blood glucose range is generally 180-200 mg/dl. His dinner blood glucose range is generally 180-200 mg/dl. His overall blood glucose range is 180-200 mg/dl. An ACE inhibitor/angiotensin II receptor blocker is being taken. He sees a podiatrist.Eye exam is current.  Hyperlipidemia  This is a chronic problem. The current episode started more than 1 year ago. Pertinent negatives include no chest pain, myalgias or shortness of breath. Current  antihyperlipidemic treatment includes statins. Risk factors for coronary artery disease include dyslipidemia, diabetes mellitus, hypertension, male sex, obesity and a sedentary lifestyle.  Hypertension  This is a chronic problem. The current episode started more than 1 year ago. Pertinent negatives include no chest pain, headaches, neck pain, palpitations or shortness of breath. Past treatments include angiotensin blockers. Hypertensive end-organ damage includes CAD/MI. Identifiable causes of hypertension include a thyroid problem.  Thyroid Problem  Presents for follow-up visit. Patient reports no constipation, diarrhea, fatigue or palpitations. The symptoms have been improving. Past treatments include levothyroxine. His past medical history is significant for hyperlipidemia.     Review of Systems  Constitutional: Negative for fatigue and unexpected weight change.  HENT: Negative for dental problem, mouth sores and trouble swallowing.   Eyes: Negative for visual disturbance.  Respiratory: Negative for cough, choking, chest tightness, shortness of breath and wheezing.   Cardiovascular: Negative for chest pain, palpitations and leg swelling.  Gastrointestinal: Negative for abdominal distention, abdominal pain, constipation, diarrhea, nausea and vomiting.  Endocrine: Positive for polydipsia and polyuria. Negative for polyphagia.  Genitourinary: Negative for dysuria, flank pain, hematuria and urgency.  Musculoskeletal: Negative for back pain, gait problem, myalgias and neck pain.  Skin: Negative for pallor, rash and wound.  Neurological: Negative for seizures, syncope, weakness, numbness and headaches.  Psychiatric/Behavioral: Negative.  Negative for confusion and dysphoric mood.    Objective:    BP 128/69   Pulse 74   Ht 5\' 5"  (1.651 m)   Wt 214 lb (97.1 kg)   BMI 35.61 kg/m   Wt Readings from Last 3 Encounters:  01/24/16 214 lb (97.1 kg)  11/18/15 212 lb (96.2 kg)  10/21/15 213 lb  (96.6 kg)    Physical Exam  Constitutional: He is oriented to person, place, and time. He appears well-developed and well-nourished. He is cooperative. No distress.  HENT:  Head: Normocephalic and atraumatic.  Eyes: EOM are normal.  Neck: Normal range of motion. Neck supple. No tracheal deviation present. No thyromegaly present.  Cardiovascular: Normal rate, S1 normal, S2 normal and normal heart sounds.  Exam reveals no gallop.   No murmur heard. Pulses:      Dorsalis pedis pulses are 1+ on the right side, and 1+ on the left side.  Posterior tibial pulses are 1+ on the right side, and 1+ on the left side.  Pulmonary/Chest: Breath sounds normal. No respiratory distress. He has no wheezes.  Abdominal: Soft. Bowel sounds are normal. He exhibits no distension. There is no tenderness. There is no guarding and no CVA tenderness.  Musculoskeletal: He exhibits no edema.       Right shoulder: He exhibits no swelling and no deformity.  Neurological: He is alert and oriented to person, place, and time. He has normal strength and normal reflexes. No cranial nerve deficit or sensory deficit. Gait normal.  Skin: Skin is warm and dry. No rash noted. No cyanosis. Nails show no clubbing.  Psychiatric: He has a normal mood and affect. His speech is normal and behavior is normal. Judgment and thought content normal. Cognition and memory are normal.    Results for orders placed or performed in visit on 01/13/16  Comprehensive metabolic panel  Result Value Ref Range   Sodium 136 135 - 146 mmol/L   Potassium 4.6 3.5 - 5.3 mmol/L   Chloride 104 98 - 110 mmol/L   CO2 25 20 - 31 mmol/L   Glucose, Bld 345 (H) 65 - 99 mg/dL   BUN 29 (H) 7 - 25 mg/dL   Creat 1.29 (H) 0.70 - 1.25 mg/dL   Total Bilirubin 0.3 0.2 - 1.2 mg/dL   Alkaline Phosphatase 35 (L) 40 - 115 U/L   AST 14 10 - 35 U/L   ALT 11 9 - 46 U/L   Total Protein 6.8 6.1 - 8.1 g/dL   Albumin 4.1 3.6 - 5.1 g/dL   Calcium 9.3 8.6 - 10.3 mg/dL   Hemoglobin A1c  Result Value Ref Range   Hgb A1c MFr Bld 8.7 (H) <5.7 %   Mean Plasma Glucose 203 mg/dL   Diabetic Labs (most recent): Lab Results  Component Value Date   HGBA1C 8.7 (H) 01/13/2016   HGBA1C 9.3 (H) 10/12/2015   HGBA1C 8.8 (H) 06/21/2015   Lipid profile (most recent): Lab Results  Component Value Date   TRIG 77 08/19/2015   CHOL 148 08/19/2015     Assessment & Plan:   1. Uncontrolled type 2 diabetes mellitus with other circulatory complication, with long-term current use of insulin ([facility]CC)   -His diabetes is  complicated by ordinary artery disease and patient remains at a high risk for more acute and chronic complications of diabetes which include CAD, CVA, CKD, retinopathy, and neuropathy. These are all discussed in detail with the patient.  Patient came with Fluctuating and above target glucose profile even  on a  largeer dose of insulin, and  recent A1c Is improving to 8.7% from 9.3%.    Glucose logs and insulin administration records pertaining to this visit,  to be scanned into patient's records.  Recent labs reviewed.   - I have re-counseled the patient on diet management and weight loss  by adopting a carbohydrate restricted / protein rich  Diet.  - Suggestion is made for patient to avoid simple carbohydrates   from their diet including Cakes , Desserts, Ice Cream,  Soda (  diet and regular) , Sweet Tea , Candies,  Chips, Cookies, Artificial Sweeteners,   and "Sugar-free" Products .  This will help patient to have stable blood glucose profile and potentially avoid unintended  Weight gain.  - Patient is advised to stick to a routine mealtimes to eat 3 meals  a day and avoid unnecessary snacks ( to snack only to correct  hypoglycemia).  - The patient  has been  scheduled with Jearld Fenton, RDN, CDE for individualized DM education.  - I have approached patient with the following individualized plan to manage diabetes and patient agrees. - He is doing  better with U500 insulin.  - I will increase Humulin U500  To 60 units with breakfast, lunch, and  lower to 45 units with supper when pre-meal blood glucose is above 90 mg/dL. - He is advised to continue strict monitoring of blood glucose before meals and at bedtime. -Patient is encouraged to call clinic for blood glucose levels less than 70 or above 200 mg /dl x 3. - I will continue low-dose metformin 500 mg by mouth twice a day. - He was informed by his insurance that they would not pay for [facility]. - Patient will be considered for incretin therapy as appropriate next visit. - Patient specific target  for A1c; LDL, HDL, Triglycerides, and  Waist Circumference were discussed in detail.  2) BP/HTN: Controlled. Continue current medications including ACEI/ARB. 3) Lipids/HPL:  continue statins. 4)  Weight/Diet: CDE consult in progress, exercise, and carbohydrates information provided.  5)Other specified hypothyroidism -  He would benefit from slight increase in his levothyroxine. I will prescribe levothyroxine 137 g by mouth every morning. - We discussed about correct intake of levothyroxine, at fasting, with water, separated by at least 30 minutes from breakfast, and separated by more than 4 hours from calcium, iron, multivitamins, acid reflux medications (PPIs). -Patient is made aware of the fact that thyroid hormone replacement is needed for life, dose to be adjusted by periodic monitoring of thyroid function tests.  6) Chronic Care/Health Maintenance:  -Patient is on ACEI/ARB and Statin medications and encouraged to continue to follow up with Ophthalmology, Podiatrist at least yearly or according to recommendations, and advised to  stay away from smoking. I have recommended yearly flu vaccine and pneumonia vaccination at least every 5 years; moderate intensity exercise for up to 150 minutes weekly; and  sleep for at least 7 hours a day.  - 25 minutes of time was spent on the care of this  patient , 50% of which was applied for counseling on diabetes complications and their preventions.  - I advised patient to maintain close follow up with [name], MD for primary care needs.  Patient is asked to bring meter and  blood glucose logs during their next visit.   Follow up plan: -Return in about 3 months (around 04/23/2016) for meter, and logs.  Glade Lloyd, MD Phone: [phone]  Fax: [phone]   01/24/2016, 9:47 AM

## 2016-01-24 NOTE — Progress Notes (Signed)
PATIENT: Paul Baker DOB: November 27, 1950  REASON FOR VISIT: follow up- obstructive sleep apnea on CPAP, restless leg syndrome HISTORY FROM: patient  HISTORY OF PRESENT ILLNESS:  HISTORY [facility]): download from 09-08-13 through 10-07-13 revealed an 11.5 cm water pressure need, average daily used to time 4 hours 41 minutes, the patient used the machine on 27 of 30 days. The patient used the machine 4 hours on 20 of 30 days. Compliance over 4 hours or 66%. Residual AHI of 0.5. This patient with severe obstructive sleep apnea followed by Dr. Krista Blue. AHI 62 titrated to 16 cm water. DME was [employer] , PCP Dr.-Luking.  Apnea cannot longer be the cause for his excessive daytime sleepiness, I will try to adjust his medication treatment for restless leg syndrome.   Interval history from 11-12-14 Paul Baker returns today for compliance visit on CPAP but also reports worsening of his restless leg syndrome. He has been 77% compliant with his CPAP use uses it on average 4 hours and 35 minutes. He is using an AutoSet between 5 and 16 cm water pressure, with a full-time expiratory pressure relief of 3 cm water. His AHI is 3.06 for flying a very good control of his previously severe obstructive sleep apnea. Sleep apnea is not the cause of his excessive daytime sleepiness anymore. His main problem is his restless legs which have been uncontrollable. He has tried to [employer], he has also tried Nuvigil in daytime to combat the daytime sleepiness. He states that the Provigil does not work as well as any longer. He had tried Requip and Mirapex in oral form. At this time he is fairly desperate. The restless limbs as I called him now has expanded to the upper extremities. He had been evaluated for iron deficiency, low ferritin, spinal stenosis peripheral vascular disease and neuropathy. He likely has an inherited form. He reports for the interval medical history that he was told his renal clearance  has decreased, he was also hospitalized for a right knee replacement but in the meantime acquired a patella tendon tear. Left knee had been replaced in 2005. Dr. Vanetta Mulders is following him for his chronic kidney dysfunction. Today's Epworth sleepiness score is endorsed at 20 points, fatigue severity at 37 points and the geriatric depression score at 4 points. The [facility] restless leg syndrome quality of life questionnaire is endorsed at moderate to severe impairment. His RLS 6 rating scale is at the very severe impairment rating.  Today 09/21/2015: MM:  Paul Baker is a 66 year old male with a history of obstructive sleep apnea on CPAP and restless leg syndrome. He returns today for follow-up. He states that he did see a neurologist at [facility] who recommended that he stop Requip and Dilaudid and start methadone. He states that he tried methadone once but it made him very sick. He is no longer using this. He states that he restarted Requip. He states that he has restless leg symptoms throughout the day. He reports that sometimes she will take Requip half a tablet during the day to control his symptoms. He states that he rarely takes Dilaudid. They did repeat a lumbar MRI but does not feel that surgery is needed at this time. The patient also has obstructive sleep apnea. His download indicates that he uses machine 26 out of 30 days for compliance of 87%. He uses machine greater than 4 hours 20 out of 30 days for compliance of 67%. His residual AHI is 3 on a  minimum pressure of 6 cm of water and maximum pressure of 16 cm water with EPR of 3. He does have a leak in the 95th percentile at 28.7 L/m. He states that he has not changed out his nasal pillows recently. He states that he has been on Nuvigil in the past for daytime sleepiness but they recently switched him to generic and he reports that this does not work well for him. He is tried patient assistance but does not qualify. He returns today  for an evaluation.  Interval history from 01/24/2016, I have the pleasure to see Paul Baker today for CPAP compliance visit. He has been highly compliant with CPAP use and used to machine 26 out of 30 days for those days under 4 hours. The overall compliance is 75%, with an average user time of 4 hours and 48 minutes. He is using an AutoSet between 6 and 16 cm water pressure with 3 cm EPR, or expiratory pressure relief. His AHI is 2.4 and most residual apneas are obstructive. The 95th percentile pressure is 12.7 cm water and therefore well covered in his current settings air leaks are minimal. He is interested in a dream wear mask, and does want to keep his beard.    REVIEW OF SYSTEMS: Out of a complete 14 system review of symptoms, the patient complains only of the following symptoms, and all other reviewed systems are negative.  Painful urination, urgency, joint pain, joint swelling, back pain, aching muscles, muscle cramps, neck pain, neck stiffness,restless leg, insomnia, apnea, frequent waking, daytime sleepiness, snoring, sleep talking, chest pain, leg swelling, hearing loss, ear pain, ringing in ears, runny nose, fatigue, excessive sweating, eye pain, eye discharge, painful urination, urgency, dizziness headache numbness swollen lymph nodes  ALLERGIES: Allergies  Allergen Reactions  . Cardizem [Diltiazem Hcl]     Edema   . Cardura [Doxazosin Mesylate]     Side effects  . Lipitor [Atorvastatin] Other (See Comments)    Leg cramps  . Paxil [Paroxetine Hcl]     Side effects   . Adhesive [Tape] Rash  . Codeine Rash and Other (See Comments)    Headache     HOME MEDICATIONS: Outpatient Medications Prior to Visit  Medication Sig Dispense Refill  . albuterol (VENTOLIN HFA) 108 (90 Base) MCG/ACT inhaler Inhale 2 puffs into the lungs 4 (four) times daily as needed for wheezing or shortness of breath. 18 g 0  . amLODipine (NORVASC) 5 MG tablet TAKE ONE TABLET BY MOUTH ONCE DAILY. 30  tablet 6  . B-D ULTRAFINE III SHORT PEN 31G X 8 MM MISC USING FIVE TIMES DAILY. 100 each 5  . clobetasol (TEMOVATE) 0.05 % external solution Apply 1 application topically daily as needed (irritation.).     [name] clonazePAM (KLONOPIN) 1 MG tablet TAKE (1) TABLET BY MOUTH AT BEDTIME FOR RESTLESS LEGS. 30 tablet 0  . fenofibrate 160 MG tablet TAKE (1) TABLET BY MOUTH AT BEDTIME. 30 tablet 5  . furosemide (LASIX) 40 MG tablet Take 40 mg by mouth every other day.     . insulin regular human CONCENTRATED (HUMULIN R U-500 KWIKPEN) 500 UNIT/ML kwikpen Inject 60 units with breakfast, 60 units with lunch, and 45 units with supper when pre-meal blood glucose is above 90 mg/dL. 4 pen 2  . isosorbide mononitrate (IMDUR) 60 MG 24 hr tablet TAKE (1/2) TABLET BY MOUTH TWICE DAILY. 60 tablet 3  . levothyroxine (SYNTHROID, LEVOTHROID) 137 MCG tablet Take 1 tablet (137 mcg total) by mouth daily  before breakfast. 30 tablet 6  . loratadine (CLARITIN) 10 MG tablet Take 10 mg by mouth at bedtime as needed for allergies.     [name] losartan (COZAAR) 50 MG tablet Take 50 mg by mouth at bedtime. Reported on 06/18/2015    . metFORMIN (GLUCOPHAGE) 500 MG tablet TAKE 2 TABLETS EACH MORNING, 1 TABLET AT NOON, AND 2 TABLETS EVERY EVENING. (Patient taking differently: TAKE 1 TABLET EACH MORNING, 1 TABLET EVERY EVENING.) 150 tablet 2  . metoprolol succinate (TOPROL-XL) 50 MG 24 hr tablet TAKE (1) TABLET BY MOUTH TWICE DAILY. 60 tablet 0  . modafinil (PROVIGIL) 200 MG tablet Take 1 tablet (200 mg total) by mouth daily. 30 tablet 5  . nitroGLYCERIN (NITROSTAT) 0.4 MG SL tablet DISSOLVE 1 TABLET UNDER TONGUE EVERY 5 MINUTES UP TO 15 MIN FOR CHESTPAIN. IF NO RELIEF CALL 911. 25 tablet 3  . ONE TOUCH ULTRA TEST test strip TEST BLOOD SUGAR UP TO 4 TIMES DAILY. 150 each 5  . ONETOUCH DELICA LANCETS 99991111 MISC USE AS DIRECTED TO TEST BLOOD SUGAR 4 TIMES DAILY. 150 each 2  . pravastatin (PRAVACHOL) 20 MG tablet TAKE ONE TABLET BY MOUTH ONCE DAILY. 30  tablet 0  . rOPINIRole (REQUIP) 4 MG tablet TAKE 0.5 TABLET BY MOUTH at lunch time and 1.5 tablets at bedtime. 60 tablet 5  . sodium chloride (OCEAN) 0.65 % SOLN nasal spray Place 1-2 sprays into both nostrils daily as needed for congestion.    . Testosterone Cypionate & Prop 200-20 MG/ML SOLN Inject 1 mL into the muscle every 14 (fourteen) days.    . Vitamin D, Ergocalciferol, (DRISDOL) 50000 units CAPS capsule TAKE 1 CAPSULE BY MOUTH ONCE A WEEK. 12 capsule 0  . losartan (COZAAR) 100 MG tablet TAKE ONE TABLET BY MOUTH ONCE DAILY. 30 tablet 2   No facility-administered medications prior to visit.     PAST MEDICAL HISTORY: Past Medical History:  Diagnosis Date  . Arthritis   . Asthma   . Colon polyp   . Coronary atherosclerosis of native coronary artery    Diseased nondominant RCA  . DVT (deep venous thrombosis) ([place]) 2005   Right arm  . Essential hypertension, benign   . Headache   . History of transfusion   . Hypersomnia    CPAP of 16 cm, diagnosed with AHI of 60 in 2012,epworth 21- narcolepsy?  [name] Hypothyroidism   . Mixed hyperlipidemia   . Morbid obesity ([place])   . MRSA (methicillin resistant staph aureus) culture positive    08/2012  . Narcolepsy   . OSA (obstructive sleep apnea)   . Pneumonia   . Psoriasis   . Pulmonary embolism ([place]) 2004  . RLS (restless legs syndrome)   . Rotator cuff disorder    Left  . Septic arthritis of knee, left ([place])   . Skin cancer, basal cell   . Type 2 diabetes mellitus ([place])     PAST SURGICAL HISTORY: Past Surgical History:  Procedure Laterality Date  . BACK SURGERY    . CATARACT EXTRACTION Bilateral   . COLONOSCOPY    . EYE SURGERY Left 2016   laser to left eye  . HIP SURGERY     bone removed from both sides of hip  . KNEE ARTHROTOMY Right 12/04/2014   Procedure: KNEE ARTHROTOMY PATELLA LIGAMENT RECONSTRUSION AND REPAIR RIGHT KNEE;  Surgeon: Paralee Cancel, MD;  Location: [facility];  Service: Orthopedics;  Laterality: Right;  . KNEE  SURGERY     X  25 TIMES  . LUMBAR DISC SURGERY     Left L3, L4, L5 discecotomy with decompression of L4 root  . TONSILLECTOMY    . TOTAL KNEE ARTHROPLASTY  2003   LEFT  . TOTAL KNEE ARTHROPLASTY Right 03/23/2014   Procedure: RIGHT TOTAL KNEE ARTHROPLASTY AND REMOVAL RIGHT TIBIAL  DEEP IMPLANT STAPLE;  Surgeon: Paralee Cancel, MD;  Location: [facility];  Service: Orthopedics;  Laterality: Right;  . TOTAL KNEE REVISION  2005   LEFT  . WRIST SURGERY      FAMILY HISTORY: Family History  Problem Relation Age of Onset  . Hypertension Father   . Heart attack Father   . Kidney Stones Father   . Seizures Grandchild   . Narcolepsy Grandchild   . Diabetes Sister     SOCIAL HISTORY: Social History   Social History  . Marital status: Married    Spouse name: Toney Reil  . Number of children: 2  . Years of education: college   Occupational History  . Disabled   .  Unemployed   Social History Main Topics  . Smoking status: Former Smoker    Types: Cigarettes    Start date: 06/19/1967    Quit date: 01/03/1995  . Smokeless tobacco: Never Used  . Alcohol use No     Comment: quit drinking in 07/86  . Drug use: No  . Sexual activity: Yes    Partners: Female   Other Topics Concern  . Not on file   Social History Narrative    66 year old, right-handed, caucasian male with a past medical history of obesity, hypertension, hyperlipidemia, diabetes, obstructive sleep apnea, presenting with frequent nighttime awakenings, excessive daytime sleepiness, also transient confusional episodes.RLS and one beosity, OSA on CPAP with AHI of 3.2 and  setting of 16 cm water , [facility] .      PHYSICAL EXAM  Vitals:   01/24/16 1407  BP: 133/76  Pulse: 67  Resp: 20  Weight: 209 lb (94.8 kg)  Height: 5\' 5"  (1.651 m)   Body mass index is 34.78 kg/m.  Generalized: Well developed, in no acute distress   Neurological examination  Mentation: Alert oriented to time, place, history taking. Follows all  commands speech and language fluent Cranial nerve ;Pupils were equal round reactive to light. Extraocular movements were full, visual field were full on confrontational test. Facial sensation and strength were normal. Uvula tongue midline. Head turning and shoulder shrug  were normal and symmetric. Motor:  5 / 5 strength of all 4 extremities. Good symmetric motor tone is noted throughout.  Sensory: intact to soft touch on all 4 extremities. No evidence of extinction is noted.  Coordination: intact finger-nose-finger / heel-to-shin bilaterally.   Gait and station: Gait is normal. Tandem gait Not attempted.  Romberg is negative. No drift is seen.  Reflexes: Deep tendon reflexes are attenuated, not brisk, symmetric bilaterally.   DIAGNOSTIC DATA (LABS, IMAGING, TESTING) - I reviewed patient records, labs, notes, testing and imaging myself where available.  Very high fasting glucose, now diabetic.      Component Value Date/Time   NA 136 01/13/2016 0821   NA 141 10/08/2013 1032   K 4.6 01/13/2016 0821   CL 104 01/13/2016 0821   CO2 25 01/13/2016 0821   GLUCOSE 345 (H) 01/13/2016 0821   BUN 29 (H) 01/13/2016 0821   BUN 28 (H) 10/08/2013 1032   CREATININE 1.29 (H) 01/13/2016 0821   CALCIUM 9.3 01/13/2016 0821   PROT 6.8 01/13/2016 [id number]  PROT 6.7 08/19/2015 0914   ALBUMIN 4.1 01/13/2016 0821   ALBUMIN 4.3 08/19/2015 0914   AST 14 01/13/2016 0821   ALT 11 01/13/2016 0821   ALKPHOS 35 (L) 01/13/2016 0821   BILITOT 0.3 01/13/2016 0821   BILITOT 0.3 08/19/2015 0914   GFRNONAA 79 10/12/2015 0852   GFRAA >89 10/12/2015 0852   Lab Results  Component Value Date   CHOL 148 08/19/2015   HDL 53 08/19/2015   LDLCALC 80 08/19/2015   TRIG 77 08/19/2015   CHOLHDL 2.8 08/19/2015   Lab Results  Component Value Date   HGBA1C 8.7 (H) 01/13/2016    Lab Results  Component Value Date   TSH 0.33 (L) 01/13/2016      ASSESSMENT AND PLAN 66 y.o. year old male  has a past medical history of  Arthritis; Asthma; Colon polyp; Coronary atherosclerosis of native coronary artery; DVT (deep venous thrombosis) ([place]) (2005); Essential hypertension, benign; Headache; History of transfusion; Hypersomnia; Hypothyroidism; Mixed hyperlipidemia; Morbid obesity ([place]); MRSA (methicillin resistant staph aureus) culture positive; Narcolepsy; OSA (obstructive sleep apnea); Pneumonia; Psoriasis; Pulmonary embolism ([place]) (2004); RLS (restless legs syndrome); Rotator cuff disorder; Septic arthritis of knee, left ([place]); Skin cancer, basal cell; and Type 2 diabetes mellitus ([place]). here with:  1. Restless leg syndrome, with a neuropathic component,  Diabetic insulin dependent.  2. OSA on CPAP 3. Excessive daytime sleepiness improved on CPAP, likes to try a dream wear.   I will increase the patient's Requip to half a tablet in the morning and one and a half tablets in the evening.  Advised that taking Requip during the day can make his sleepiness worse.  He voiced understanding. We will try Provigil for daytime sleepiness.   Terri Malerba, MD  01/24/2016, 2:52 PM [facility] [address], [place], [place] [address] [phone]

## 2016-01-29 ENCOUNTER — Other Ambulatory Visit: Payer: Self-pay | Admitting: Family Medicine

## 2016-01-31 ENCOUNTER — Ambulatory Visit (INDEPENDENT_AMBULATORY_CARE_PROVIDER_SITE_OTHER): Payer: Medicare Other | Admitting: Family Medicine

## 2016-01-31 ENCOUNTER — Encounter: Payer: Self-pay | Admitting: Family Medicine

## 2016-01-31 VITALS — BP 128/84 | Ht 65.0 in | Wt 214.0 lb

## 2016-01-31 DIAGNOSIS — I10 Essential (primary) hypertension: Secondary | ICD-10-CM | POA: Diagnosis not present

## 2016-01-31 DIAGNOSIS — E785 Hyperlipidemia, unspecified: Secondary | ICD-10-CM | POA: Diagnosis not present

## 2016-01-31 DIAGNOSIS — Z79899 Other long term (current) drug therapy: Secondary | ICD-10-CM | POA: Diagnosis not present

## 2016-01-31 DIAGNOSIS — J4520 Mild intermittent asthma, uncomplicated: Secondary | ICD-10-CM | POA: Diagnosis not present

## 2016-01-31 NOTE — Progress Notes (Signed)
   Subjective:    Patient ID: Paul Baker, male    DOB: 05/19/1950, 66 y.o.   MRN: [id number]  Hyperlipidemia  This is a chronic problem. The current episode started more than 1 year ago. Treatments tried: pravastatin. There are no compliance problems (works out and eats health conscious).    Urologists are watching the psa, bladder does not completely empty, Pt was placed on raped flo and it did not help  Up some nights , and some  Patient continues to take lipid medication regularly. No obvious side effects from it. Generally does not miss a dose. Prior blood work results are reviewed with patient. Patient continues to work on fat intake in diet  Results for orders placed or performed in visit on 01/13/16  Comprehensive metabolic panel  Result Value Ref Range   Sodium 136 135 - 146 mmol/L   Potassium 4.6 3.5 - 5.3 mmol/L   Chloride 104 98 - 110 mmol/L   CO2 25 20 - 31 mmol/L   Glucose, Bld 345 (H) 65 - 99 mg/dL   BUN 29 (H) 7 - 25 mg/dL   Creat 1.29 (H) 0.70 - 1.25 mg/dL   Total Bilirubin 0.3 0.2 - 1.2 mg/dL   Alkaline Phosphatase 35 (L) 40 - 115 U/L   AST 14 10 - 35 U/L   ALT 11 9 - 46 U/L   Total Protein 6.8 6.1 - 8.1 g/dL   Albumin 4.1 3.6 - 5.1 g/dL   Calcium 9.3 8.6 - 10.3 mg/dL  Hemoglobin A1c  Result Value Ref Range   Hgb A1c MFr Bld 8.7 (H) <5.7 %   Mean Plasma Glucose 203 mg/dL   Blood pressure medicine and blood pressure levels reviewed today with patient. Compliant with blood pressure medicine. States does not miss a dose. No obvious side effects. Blood pressure generally good when checked elsewhere. Watching salt intake.   asthma clinically stable. Rarely uses albuterol currently. No majr shortness of brath   Review of Systems No headache, no major weight loss or weight gain, no chest pain no back pain abdominal pain no change in bowel habits complete ROS otherwise negative     Objective:   Physical Exam Alert vitals stable, NAD. Blood pressure good  on repeat. HEENT normal. Lungs clear. Heart regular rate and rhythm.  obesity present ankles trace edema       Assessment & Plan:   impression 1 hypertension good control discussed maintain same #2 hyperlipidemia mixed n nature exact control uncertain further recommendations based on blood work results #3 asthma clinically stable to maintain same meds plan diet exercise discussed medications refilledblood work ordered further recommendations based resuls

## 2016-02-01 DIAGNOSIS — E291 Testicular hypofunction: Secondary | ICD-10-CM | POA: Diagnosis not present

## 2016-02-02 ENCOUNTER — Other Ambulatory Visit: Payer: Self-pay | Admitting: Family Medicine

## 2016-02-09 ENCOUNTER — Other Ambulatory Visit: Payer: Self-pay | Admitting: Family Medicine

## 2016-02-15 DIAGNOSIS — E291 Testicular hypofunction: Secondary | ICD-10-CM | POA: Diagnosis not present

## 2016-02-18 ENCOUNTER — Other Ambulatory Visit: Payer: Self-pay | Admitting: Neurology

## 2016-02-18 ENCOUNTER — Other Ambulatory Visit: Payer: Self-pay | Admitting: Cardiology

## 2016-02-18 DIAGNOSIS — G2581 Restless legs syndrome: Secondary | ICD-10-CM

## 2016-03-03 ENCOUNTER — Other Ambulatory Visit: Payer: Self-pay | Admitting: Adult Health

## 2016-03-06 NOTE — Progress Notes (Signed)
Cardiology Office Note  Date: 03/07/2016   ID: TRAMPAS RAVENS, DOB 06-16-1950, MRN [id number]  PCP: [name], MD  Primary Cardiologist: [name], MD   Chief Complaint  Patient presents with  . Coronary Artery Disease    History of Present Illness: Paul Baker is a 66 y.o. male last seen in September 2017. He presents for a routine follow-up visit. Reports occasional angina symptoms, nothing progressive on the current regimen. He has not been exercising as much as. Reports fluctuating blood sugars.  I reviewed his current medications. Cardiac regimen includes Norvasc, fenofibrate, Lasix, Imdur, Cozaar, Toprol-XL, Pravachol, and as needed nitroglycerin.  I reviewed his lab work from January which is outlined below. Last LDL prior to that was 80.  Past Medical History:  Diagnosis Date  . Arthritis   . Asthma   . Colon polyp   . Coronary atherosclerosis of native coronary artery    Diseased nondominant RCA  . DVT (deep venous thrombosis) ([place]) 2005   Right arm  . Essential hypertension, benign   . Headache   . History of transfusion   . Hypersomnia    CPAP of 16 cm, diagnosed with AHI of 60 in 2012,[place] 21- narcolepsy?  [name] Hypothyroidism   . Mixed hyperlipidemia   . Morbid obesity ([place])   . MRSA (methicillin resistant staph aureus) culture positive    08/2012  . Narcolepsy   . OSA (obstructive sleep apnea)   . Pneumonia   . Psoriasis   . Pulmonary embolism ([place]) 2004  . RLS (restless legs syndrome)   . Rotator cuff disorder    Left  . Septic arthritis of knee, left ([place])   . Skin cancer, basal cell   . Type 2 diabetes mellitus ([place])     Past Surgical History:  Procedure Laterality Date  . BACK SURGERY    . CATARACT EXTRACTION Bilateral   . COLONOSCOPY    . EYE SURGERY Left 2016   laser to left eye  . HIP SURGERY     bone removed from both sides of hip  . KNEE ARTHROTOMY Right 12/04/2014   Procedure: KNEE ARTHROTOMY PATELLA LIGAMENT  RECONSTRUSION AND REPAIR RIGHT KNEE;  Surgeon: Paralee Cancel, MD;  Location: [facility];  Service: Orthopedics;  Laterality: Right;  . KNEE SURGERY     X 25 TIMES  . LUMBAR DISC SURGERY     Left L3, L4, L5 discecotomy with decompression of L4 root  . TONSILLECTOMY    . TOTAL KNEE ARTHROPLASTY  2003   LEFT  . TOTAL KNEE ARTHROPLASTY Right 03/23/2014   Procedure: RIGHT TOTAL KNEE ARTHROPLASTY AND REMOVAL RIGHT TIBIAL  DEEP IMPLANT STAPLE;  Surgeon: Paralee Cancel, MD;  Location: [facility];  Service: Orthopedics;  Laterality: Right;  . TOTAL KNEE REVISION  2005   LEFT  . WRIST SURGERY      Current Outpatient Prescriptions  Medication Sig Dispense Refill  . albuterol (VENTOLIN HFA) 108 (90 Base) MCG/ACT inhaler Inhale 2 puffs into the lungs 4 (four) times daily as needed for wheezing or shortness of breath. 18 g 0  . amLODipine (NORVASC) 5 MG tablet TAKE ONE TABLET BY MOUTH ONCE DAILY. 30 tablet 6  . B-D ULTRAFINE III SHORT PEN 31G X 8 MM MISC USING FIVE TIMES DAILY. 100 each 5  . clobetasol (TEMOVATE) 0.05 % external solution Apply 1 application topically daily as needed (irritation.).     [name] clonazePAM (KLONOPIN) 1 MG tablet TAKE (1) TABLET BY MOUTH AT  BEDTIME FOR RESTLESS LEGS. 30 tablet 2  . fenofibrate 160 MG tablet TAKE (1) TABLET BY MOUTH AT BEDTIME. 30 tablet 5  . furosemide (LASIX) 40 MG tablet Take 40 mg by mouth every other day.     . insulin regular human CONCENTRATED (HUMULIN R U-500 KWIKPEN) 500 UNIT/ML kwikpen Inject 60 units with breakfast, 60 units with lunch, and 45 units with supper when pre-meal blood glucose is above 90 mg/dL. 4 pen 2  . isosorbide mononitrate (IMDUR) 60 MG 24 hr tablet TAKE (1/2) TABLET BY MOUTH TWICE DAILY. 60 tablet 3  . levothyroxine (SYNTHROID, LEVOTHROID) 137 MCG tablet Take 1 tablet (137 mcg total) by mouth daily before breakfast. 30 tablet 6  . loratadine (CLARITIN) 10 MG tablet Take 10 mg by mouth at bedtime as needed for allergies.     [name] losartan (COZAAR) 50  MG tablet Take 50 mg by mouth at bedtime. Reported on 06/18/2015    . metFORMIN (GLUCOPHAGE) 500 MG tablet TAKE 2 TABLETS EACH MORNING, 1 TABLET AT NOON, AND 2 TABLETS EVERY EVENING. 150 tablet 2  . metoprolol succinate (TOPROL-XL) 50 MG 24 hr tablet TAKE (1) TABLET BY MOUTH TWICE DAILY. 60 tablet 1  . modafinil (PROVIGIL) 200 MG tablet TAKE ONE TABLET BY MOUTH ONCE DAILY. 30 tablet 5  . nitroGLYCERIN (NITROSTAT) 0.4 MG SL tablet DISSOLVE 1 TABLET UNDER TONGUE EVERY 5 MINUTES UP TO 15 MIN FOR CHESTPAIN. IF NO RELIEF CALL 911. 25 tablet 3  . ONE TOUCH ULTRA TEST test strip TEST BLOOD SUGAR UP TO 4 TIMES DAILY. 150 each 5  . ONETOUCH DELICA LANCETS 99991111 MISC USE AS DIRECTED TO TEST BLOOD SUGAR 4 TIMES DAILY. 150 each 2  . pravastatin (PRAVACHOL) 20 MG tablet TAKE ONE TABLET BY MOUTH ONCE DAILY. 30 tablet 0  . rOPINIRole (REQUIP) 4 MG tablet TAKE 0.5 TABLET BY MOUTH at lunch time and 1.5 tablets at bedtime. 60 tablet 5  . sodium chloride (OCEAN) 0.65 % SOLN nasal spray Place 1-2 sprays into both nostrils daily as needed for congestion.    . Testosterone Cypionate & Prop 200-20 MG/ML SOLN Inject 1 mL into the muscle every 14 (fourteen) days.    . Vitamin D, Ergocalciferol, (DRISDOL) 50000 units CAPS capsule TAKE 1 CAPSULE BY MOUTH ONCE A WEEK. 12 capsule 0   No current facility-administered medications for this visit.    Allergies:  Cardizem [diltiazem hcl]; Cardura [doxazosin mesylate]; Lipitor [atorvastatin]; Paxil [paroxetine hcl]; Adhesive [tape]; and Codeine   Social History: The patient  reports that he quit smoking about 21 years ago. His smoking use included Cigarettes. He started smoking about 48 years ago. He has never used smokeless tobacco. He reports that he does not drink alcohol or use drugs.   ROS:  Please see the history of present illness. Otherwise, complete review of systems is positive for arthritic pains.  All other systems are reviewed and negative.   Physical Exam: VS:  BP  124/82   Pulse 86   Ht 5\' 5"  (1.651 m)   Wt 218 lb (98.9 kg)   SpO2 91%   BMI 36.28 kg/m , BMI Body mass index is 36.28 kg/m.  Wt Readings from Last 3 Encounters:  03/07/16 218 lb (98.9 kg)  01/31/16 214 lb (97.1 kg)  01/24/16 209 lb (94.8 kg)    Overweight male, appears comfortable at rest. HEENT: Conjunctiva and lids normal, oropharynx clear. Neck: Supple, no elevated JVP or carotid bruits, no thyromegaly. Lungs: Clear to auscultation, nonlabored  breathing at rest. Cardiac: Regular rate and rhythm, no S3 or significant systolic murmur, no pericardial rub. Abdomen: Soft, nontender, bowel sounds present, no guarding or rebound. Extremities: No pitting edema, right knee with well healed anterior incision, distal pulses 2+. Skin: Warm and dry. Musculoskeletal: No kyphosis. Neuropsychiatric: Alert and oriented 3, affect appropriate.  ECG: I personally reviewed the tracing from 02/26/2015 which showed normal sinus rhythm.  Recent Labwork: 01/13/2016: ALT 11; AST 14; BUN 29; Creat 1.29; Potassium 4.6; Sodium 136; TSH 0.33     Component Value Date/Time   CHOL 148 08/19/2015 0914   TRIG 77 08/19/2015 0914   HDL 53 08/19/2015 0914   CHOLHDL 2.8 08/19/2015 0914   CHOLHDL 4.3 11/13/2013 0843   VLDL 31 11/13/2013 0843   LDLCALC 80 08/19/2015 0914   Assessment and Plan:  1. CAD with stable angina symptoms, disease nondominant RCA that is being managed medically. No changes made present regimen. Encouraged regular exercise plan and control of diabetes.  2. Hyperlipidemia, on statin therapy, last LDL 80.  3. Essential hypertension, blood pressure adequately controlled today.  4. Type 2 diabetes mellitus, on oral agents and insulin. He follows with Dr. Wolfgang Phoenix. Last hemoglobin A1c 8.7.  Current medicines were reviewed with the patient today.  Disposition: Follow-up in 6 months.  Signed, Satira Sark, MD, [facility] 03/07/2016 9:33 AM    [facility]  [place]. [address], [place], [place] [address] Phone: [phone]; Fax: [phone]

## 2016-03-07 ENCOUNTER — Ambulatory Visit (INDEPENDENT_AMBULATORY_CARE_PROVIDER_SITE_OTHER): Payer: Medicare Other | Admitting: Cardiology

## 2016-03-07 ENCOUNTER — Encounter: Payer: Self-pay | Admitting: Cardiology

## 2016-03-07 VITALS — BP 124/82 | HR 86 | Ht 65.0 in | Wt 218.0 lb

## 2016-03-07 DIAGNOSIS — E118 Type 2 diabetes mellitus with unspecified complications: Secondary | ICD-10-CM

## 2016-03-07 DIAGNOSIS — I25119 Atherosclerotic heart disease of native coronary artery with unspecified angina pectoris: Secondary | ICD-10-CM

## 2016-03-07 DIAGNOSIS — E1165 Type 2 diabetes mellitus with hyperglycemia: Secondary | ICD-10-CM | POA: Diagnosis not present

## 2016-03-07 DIAGNOSIS — I209 Angina pectoris, unspecified: Secondary | ICD-10-CM | POA: Diagnosis not present

## 2016-03-07 DIAGNOSIS — E291 Testicular hypofunction: Secondary | ICD-10-CM | POA: Diagnosis not present

## 2016-03-07 DIAGNOSIS — Z794 Long term (current) use of insulin: Secondary | ICD-10-CM | POA: Diagnosis not present

## 2016-03-07 DIAGNOSIS — E782 Mixed hyperlipidemia: Secondary | ICD-10-CM

## 2016-03-07 DIAGNOSIS — I10 Essential (primary) hypertension: Secondary | ICD-10-CM

## 2016-03-07 DIAGNOSIS — IMO0002 Reserved for concepts with insufficient information to code with codable children: Secondary | ICD-10-CM

## 2016-03-07 NOTE — Patient Instructions (Signed)
Your physician wants you to follow-up in: 6 months with Dr McDowell You will receive a reminder letter in the mail two months in advance. If you don't receive a letter, please call our office to schedule the follow-up appointment.     Your physician recommends that you continue on your current medications as directed. Please refer to the Current Medication list given to you today.    If you need a refill on your cardiac medications before your next appointment, please call your pharmacy.     Thank you for choosing [facility] !        

## 2016-03-15 ENCOUNTER — Other Ambulatory Visit: Payer: Self-pay | Admitting: Family Medicine

## 2016-03-15 ENCOUNTER — Other Ambulatory Visit: Payer: Self-pay | Admitting: "Endocrinology

## 2016-03-20 ENCOUNTER — Other Ambulatory Visit: Payer: Self-pay | Admitting: "Endocrinology

## 2016-03-21 DIAGNOSIS — E291 Testicular hypofunction: Secondary | ICD-10-CM | POA: Diagnosis not present

## 2016-03-28 ENCOUNTER — Other Ambulatory Visit: Payer: Self-pay | Admitting: Cardiology

## 2016-04-21 ENCOUNTER — Other Ambulatory Visit: Payer: Self-pay | Admitting: Family Medicine

## 2016-04-25 ENCOUNTER — Ambulatory Visit: Payer: Medicare Other | Admitting: "Endocrinology

## 2016-04-29 ENCOUNTER — Other Ambulatory Visit: Payer: Self-pay | Admitting: Cardiology

## 2016-04-29 ENCOUNTER — Other Ambulatory Visit: Payer: Self-pay | Admitting: Neurology

## 2016-04-29 ENCOUNTER — Other Ambulatory Visit: Payer: Self-pay | Admitting: Family Medicine

## 2016-04-29 DIAGNOSIS — G2581 Restless legs syndrome: Secondary | ICD-10-CM

## 2016-05-04 ENCOUNTER — Other Ambulatory Visit: Payer: Self-pay | Admitting: "Endocrinology

## 2016-05-04 DIAGNOSIS — E1159 Type 2 diabetes mellitus with other circulatory complications: Secondary | ICD-10-CM | POA: Diagnosis not present

## 2016-05-04 LAB — COMPREHENSIVE METABOLIC PANEL
ALBUMIN: 4.3 g/dL (ref 3.6–5.1)
ALT: 17 U/L (ref 9–46)
AST: 20 U/L (ref 10–35)
Alkaline Phosphatase: 37 U/L — ABNORMAL LOW (ref 40–115)
BUN: 30 mg/dL — ABNORMAL HIGH (ref 7–25)
CHLORIDE: 103 mmol/L (ref 98–110)
CO2: 27 mmol/L (ref 20–31)
CREATININE: 1.4 mg/dL — AB (ref 0.70–1.25)
Calcium: 9.7 mg/dL (ref 8.6–10.3)
Glucose, Bld: 159 mg/dL — ABNORMAL HIGH (ref 65–99)
POTASSIUM: 4.7 mmol/L (ref 3.5–5.3)
SODIUM: 139 mmol/L (ref 135–146)
Total Bilirubin: 0.4 mg/dL (ref 0.2–1.2)
Total Protein: 7 g/dL (ref 6.1–8.1)

## 2016-05-05 ENCOUNTER — Ambulatory Visit: Payer: Medicare Other | Admitting: "Endocrinology

## 2016-05-05 LAB — HEMOGLOBIN A1C
Hgb A1c MFr Bld: 8.1 % — ABNORMAL HIGH (ref <5.7)
Mean Plasma Glucose: 186 mg/dL

## 2016-05-09 DIAGNOSIS — N189 Chronic kidney disease, unspecified: Secondary | ICD-10-CM | POA: Diagnosis not present

## 2016-05-10 DIAGNOSIS — E119 Type 2 diabetes mellitus without complications: Secondary | ICD-10-CM | POA: Diagnosis not present

## 2016-05-10 DIAGNOSIS — Z6836 Body mass index (BMI) 36.0-36.9, adult: Secondary | ICD-10-CM | POA: Diagnosis not present

## 2016-05-10 DIAGNOSIS — I10 Essential (primary) hypertension: Secondary | ICD-10-CM | POA: Diagnosis not present

## 2016-05-10 DIAGNOSIS — N189 Chronic kidney disease, unspecified: Secondary | ICD-10-CM | POA: Diagnosis not present

## 2016-05-10 DIAGNOSIS — R42 Dizziness and giddiness: Secondary | ICD-10-CM | POA: Diagnosis not present

## 2016-05-11 ENCOUNTER — Ambulatory Visit: Payer: Medicare Other | Admitting: "Endocrinology

## 2016-05-15 ENCOUNTER — Ambulatory Visit (INDEPENDENT_AMBULATORY_CARE_PROVIDER_SITE_OTHER): Payer: Medicare Other | Admitting: "Endocrinology

## 2016-05-15 ENCOUNTER — Encounter: Payer: Self-pay | Admitting: "Endocrinology

## 2016-05-15 VITALS — BP 145/79 | HR 79 | Ht 65.0 in | Wt 215.0 lb

## 2016-05-15 DIAGNOSIS — I25119 Atherosclerotic heart disease of native coronary artery with unspecified angina pectoris: Secondary | ICD-10-CM | POA: Diagnosis not present

## 2016-05-15 DIAGNOSIS — E1159 Type 2 diabetes mellitus with other circulatory complications: Secondary | ICD-10-CM

## 2016-05-15 DIAGNOSIS — I10 Essential (primary) hypertension: Secondary | ICD-10-CM | POA: Diagnosis not present

## 2016-05-15 DIAGNOSIS — E782 Mixed hyperlipidemia: Secondary | ICD-10-CM | POA: Diagnosis not present

## 2016-05-15 DIAGNOSIS — E038 Other specified hypothyroidism: Secondary | ICD-10-CM | POA: Diagnosis not present

## 2016-05-15 NOTE — Patient Instructions (Signed)

## 2016-05-15 NOTE — Progress Notes (Signed)
Subjective:    Patient ID: Paul [place], male    DOB: 08-22-1950, PCP [name], MD   Past Medical History:  Diagnosis Date  . Arthritis   . Asthma   . Colon polyp   . Coronary atherosclerosis of native coronary artery    Diseased nondominant RCA  . DVT (deep venous thrombosis) ([place]) 2005   Right arm  . Essential hypertension, benign   . Headache   . History of transfusion   . Hypersomnia    CPAP of 16 cm, diagnosed with AHI of 60 in 2012,[place] 21- narcolepsy?  [name] Hypothyroidism   . Mixed hyperlipidemia   . Morbid obesity ([place])   . MRSA (methicillin resistant staph aureus) culture positive    08/2012  . Narcolepsy   . OSA (obstructive sleep apnea)   . Pneumonia   . Psoriasis   . Pulmonary embolism ([place]) 2004  . RLS (restless legs syndrome)   . Rotator cuff disorder    Left  . Septic arthritis of knee, left ([place])   . Skin cancer, basal cell   . Type 2 diabetes mellitus ([place])    Past Surgical History:  Procedure Laterality Date  . BACK SURGERY    . CATARACT EXTRACTION Bilateral   . COLONOSCOPY    . EYE SURGERY Left 2016   laser to left eye  . HIP SURGERY     bone removed from both sides of hip  . KNEE ARTHROTOMY Right 12/04/2014   Procedure: KNEE ARTHROTOMY PATELLA LIGAMENT RECONSTRUSION AND REPAIR RIGHT KNEE;  Surgeon: Paralee Cancel, MD;  Location: [facility];  Service: Orthopedics;  Laterality: Right;  . KNEE SURGERY     X 25 TIMES  . LUMBAR DISC SURGERY     Left L3, L4, L5 discecotomy with decompression of L4 root  . TONSILLECTOMY    . TOTAL KNEE ARTHROPLASTY  2003   LEFT  . TOTAL KNEE ARTHROPLASTY Right 03/23/2014   Procedure: RIGHT TOTAL KNEE ARTHROPLASTY AND REMOVAL RIGHT TIBIAL  DEEP IMPLANT STAPLE;  Surgeon: Paralee Cancel, MD;  Location: [facility];  Service: Orthopedics;  Laterality: Right;  . TOTAL KNEE REVISION  2005   LEFT  . WRIST SURGERY     Social History   Social History  . Marital status: Married    Spouse name: Toney Reil  . Number of  children: 2  . Years of education: college   Occupational History  . Disabled   .  Unemployed   Social History Main Topics  . Smoking status: Former Smoker    Types: Cigarettes    Start date: 06/19/1967    Quit date: 01/03/1995  . Smokeless tobacco: Never Used  . Alcohol use No     Comment: quit drinking in 07/86  . Drug use: No  . Sexual activity: Yes    Partners: Female   Other Topics Concern  . None   Social History Narrative    66 year old, right-handed, caucasian male with a past medical history of obesity, hypertension, hyperlipidemia, diabetes, obstructive sleep apnea, presenting with frequent nighttime awakenings, excessive daytime sleepiness, also transient confusional episodes.RLS and one beosity, OSA on CPAP with AHI of 3.2 and  setting of 16 cm water , [facility] .   Outpatient Encounter Prescriptions as of 05/15/2016  Medication Sig  . albuterol (VENTOLIN HFA) 108 (90 Base) MCG/ACT inhaler Inhale 2 puffs into the lungs 4 (four) times daily as needed for wheezing or shortness of breath.  [name] amLODipine (NORVASC) 5 MG  tablet TAKE ONE TABLET BY MOUTH ONCE DAILY.  [name] B-D ULTRAFINE III SHORT PEN 31G X 8 MM MISC USING FIVE TIMES DAILY.  . clobetasol (TEMOVATE) 0.05 % external solution Apply 1 application topically daily as needed (irritation.).   [name] clonazePAM (KLONOPIN) 1 MG tablet TAKE (1) TABLET BY MOUTH AT BEDTIME FOR RESTLESS LEGS.  . fenofibrate 160 MG tablet TAKE (1) TABLET BY MOUTH AT BEDTIME.  . furosemide (LASIX) 40 MG tablet Take 40 mg by mouth every other day.   [name] HUMULIN R U-500 KWIKPEN 500 UNIT/ML kwikpen INJECT 50 UNITS SQ 3 TIMES DAILY WITH MEALS.  [name] isosorbide mononitrate (IMDUR) 60 MG 24 hr tablet TAKE (1/2) TABLET BY MOUTH TWICE DAILY.  [name] levothyroxine (SYNTHROID, LEVOTHROID) 137 MCG tablet Take 1 tablet (137 mcg total) by mouth daily before breakfast.  . loratadine (CLARITIN) 10 MG tablet Take 10 mg by mouth at bedtime as needed for allergies.   [name]  losartan (COZAAR) 50 MG tablet Take 50 mg by mouth at bedtime. Reported on 06/18/2015  . metFORMIN (GLUCOPHAGE) 500 MG tablet TAKE 2 TABLETS EACH MORNING, 1 TABLET AT NOON, AND 2 TABLETS EVERY EVENING.  . metoprolol succinate (TOPROL-XL) 50 MG 24 hr tablet TAKE (1) TABLET BY MOUTH TWICE DAILY.  . modafinil (PROVIGIL) 200 MG tablet TAKE ONE TABLET BY MOUTH ONCE DAILY.  . nitroGLYCERIN (NITROSTAT) 0.4 MG SL tablet DISSOLVE 1 TABLET UNDER TONGUE EVERY 5 MINUTES UP TO 15 MIN FOR CHESTPAIN. IF NO RELIEF CALL 911.  [name] ONE TOUCH ULTRA TEST test strip TEST BLOOD SUGAR UP TO 4 TIMES DAILY.  [name] ONETOUCH DELICA LANCETS 14H MISC USE AS DIRECTED TO TEST BLOOD SUGAR 4 TIMES DAILY.  . pravastatin (PRAVACHOL) 20 MG tablet TAKE ONE TABLET BY MOUTH ONCE DAILY.  [name] rOPINIRole (REQUIP) 4 MG tablet TAKE 0.5 TABLET BY MOUTH at lunch time and 1.5 tablets at bedtime.  . sodium chloride (OCEAN) 0.65 % SOLN nasal spray Place 1-2 sprays into both nostrils daily as needed for congestion.  . Testosterone Cypionate & Prop 200-20 MG/ML SOLN Inject 1 mL into the muscle every 14 (fourteen) days.  . Vitamin D, Ergocalciferol, (DRISDOL) 50000 units CAPS capsule TAKE 1 CAPSULE BY MOUTH ONCE A WEEK.   No facility-administered encounter medications on file as of 05/15/2016.    ALLERGIES: Allergies  Allergen Reactions  . Cardizem [Diltiazem Hcl]     Edema   . Cardura [Doxazosin Mesylate]     Side effects  . Lipitor [Atorvastatin] Other (See Comments)    Leg cramps  . Paxil [Paroxetine Hcl]     Side effects   . Adhesive [Tape] Rash  . Codeine Rash and Other (See Comments)    Headache    VACCINATION STATUS: Immunization History  Administered Date(s) Administered  . Influenza Split 09/18/2012  . Influenza,inj,Quad PF,36+ Mos 10/13/2013, 10/06/2014  . Influenza-Unspecified 12/01/2015  . Pneumococcal Polysaccharide-23 10/02/2004, 10/03/2011  . Td 05/24/2006    Diabetes  He presents for his follow-up diabetic visit. He has  type 2 diabetes mellitus. Onset time: Was diagnosed at approximate age of 66 years. His disease course has been improving. There are no hypoglycemic associated symptoms. Pertinent negatives for hypoglycemia include no confusion, headaches, pallor or seizures. Associated symptoms include polydipsia and polyuria. Pertinent negatives for diabetes include no chest pain, no fatigue, no polyphagia and no weakness. There are no hypoglycemic complications. Symptoms are improving. Diabetic complications include heart disease. Risk factors for coronary artery disease include diabetes mellitus, dyslipidemia, male sex, obesity,  sedentary lifestyle and tobacco exposure. Current diabetic treatment includes intensive insulin program. He is compliant with treatment most of the time. His weight is stable. He is following a generally unhealthy diet. He has had a previous visit with a dietitian. He never participates in exercise. His home blood glucose trend is fluctuating minimally. His breakfast blood glucose range is generally 180-200 mg/dl. His lunch blood glucose range is generally 180-200 mg/dl. His dinner blood glucose range is generally 180-200 mg/dl. His overall blood glucose range is 180-200 mg/dl. An ACE inhibitor/angiotensin II receptor blocker is being taken. He sees a podiatrist.Eye exam is current.  Hyperlipidemia  This is a chronic problem. The current episode started more than 1 year ago. Pertinent negatives include no chest pain, myalgias or shortness of breath. Current antihyperlipidemic treatment includes statins. Risk factors for coronary artery disease include dyslipidemia, diabetes mellitus, hypertension, male sex, obesity and a sedentary lifestyle.  Hypertension  This is a chronic problem. The current episode started more than 1 year ago. Pertinent negatives include no chest pain, headaches, neck pain, palpitations or shortness of breath. Past treatments include angiotensin blockers. Hypertensive end-organ  damage includes CAD/MI. Identifiable causes of hypertension include a thyroid problem.  Thyroid Problem  Presents for follow-up visit. Patient reports no constipation, diarrhea, fatigue or palpitations. The symptoms have been improving. Past treatments include levothyroxine. His past medical history is significant for hyperlipidemia.     Review of Systems  Constitutional: Negative for fatigue and unexpected weight change.  HENT: Negative for dental problem, mouth sores and trouble swallowing.   Eyes: Negative for visual disturbance.  Respiratory: Negative for cough, choking, chest tightness, shortness of breath and wheezing.   Cardiovascular: Negative for chest pain, palpitations and leg swelling.  Gastrointestinal: Negative for abdominal distention, abdominal pain, constipation, diarrhea, nausea and vomiting.  Endocrine: Positive for polydipsia and polyuria. Negative for polyphagia.  Genitourinary: Negative for dysuria, flank pain, hematuria and urgency.  Musculoskeletal: Negative for back pain, gait problem, myalgias and neck pain.  Skin: Negative for pallor, rash and wound.  Neurological: Negative for seizures, syncope, weakness, numbness and headaches.  Psychiatric/Behavioral: Negative.  Negative for confusion and dysphoric mood.    Objective:    BP (!) 145/79   Pulse 79   Ht 5\' 5"  (1.651 m)   Wt 215 lb (97.5 kg)   BMI 35.78 kg/m   Wt Readings from Last 3 Encounters:  05/15/16 215 lb (97.5 kg)  03/07/16 218 lb (98.9 kg)  01/31/16 214 lb (97.1 kg)    Physical Exam  Constitutional: He is oriented to person, place, and time. He appears well-developed and well-nourished. He is cooperative. No distress.  HENT:  Head: Normocephalic and atraumatic.  Eyes: EOM are normal.  Neck: Normal range of motion. Neck supple. No tracheal deviation present. No thyromegaly present.  Cardiovascular: Normal rate, S1 normal, S2 normal and normal heart sounds.  Exam reveals no gallop.   No murmur  heard. Pulses:      Dorsalis pedis pulses are 1+ on the right side, and 1+ on the left side.       Posterior tibial pulses are 1+ on the right side, and 1+ on the left side.  Pulmonary/Chest: Breath sounds normal. No respiratory distress. He has no wheezes.  Abdominal: Soft. Bowel sounds are normal. He exhibits no distension. There is no tenderness. There is no guarding and no CVA tenderness.  Musculoskeletal: He exhibits no edema.       Right shoulder: He exhibits no swelling and  no deformity.  Neurological: He is alert and oriented to person, place, and time. He has normal strength and normal reflexes. No cranial nerve deficit or sensory deficit. Gait normal.  Skin: Skin is warm and dry. No rash noted. No cyanosis. Nails show no clubbing.  Psychiatric: He has a normal mood and affect. His speech is normal and behavior is normal. Judgment and thought content normal. Cognition and memory are normal.    Results for orders placed or performed in visit on 05/04/16  Comprehensive metabolic panel  Result Value Ref Range   Sodium 139 135 - 146 mmol/L   Potassium 4.7 3.5 - 5.3 mmol/L   Chloride 103 98 - 110 mmol/L   CO2 27 20 - 31 mmol/L   Glucose, Bld 159 (H) 65 - 99 mg/dL   BUN 30 (H) 7 - 25 mg/dL   Creat 1.40 (H) 0.70 - 1.25 mg/dL   Total Bilirubin 0.4 0.2 - 1.2 mg/dL   Alkaline Phosphatase 37 (L) 40 - 115 U/L   AST 20 10 - 35 U/L   ALT 17 9 - 46 U/L   Total Protein 7.0 6.1 - 8.1 g/dL   Albumin 4.3 3.6 - 5.1 g/dL   Calcium 9.7 8.6 - 10.3 mg/dL  Hemoglobin A1c  Result Value Ref Range   Hgb A1c MFr Bld 8.1 (H) <5.7 %   Mean Plasma Glucose 186 mg/dL   Diabetic Labs (most recent): Lab Results  Component Value Date   HGBA1C 8.1 (H) 05/04/2016   HGBA1C 8.7 (H) 01/13/2016   HGBA1C 9.3 (H) 10/12/2015   Lipid profile (most recent): Lab Results  Component Value Date   TRIG 77 08/19/2015   CHOL 148 08/19/2015     Assessment & Plan:   1. Uncontrolled type 2 diabetes mellitus with  other circulatory complication, with long-term current use of insulin (HCC)   -His diabetes is  complicated by ordinary artery disease and patient remains at a high risk for more acute and chronic complications of diabetes which include CAD, CVA, CKD, retinopathy, and neuropathy. These are all discussed in detail with the patient.  Patient came with Fluctuating and above target glucose profile even  on a  largeer dose of insulin, and  recent A1c Is 8.1%, slowly improving from from 9.3%.    Glucose logs and insulin administration records pertaining to this visit,  to be scanned into patient's records.  Recent labs reviewed.   - I have re-counseled the patient on diet management and weight loss  by adopting a carbohydrate restricted / protein rich  Diet.  - Suggestion is made for patient to avoid simple carbohydrates   from their diet including Cakes , Desserts, Ice Cream,  Soda (  diet and regular) , Sweet Tea , Candies,  Chips, Cookies, Artificial Sweeteners,   and "Sugar-free" Products .  This will help patient to have stable blood glucose profile and potentially avoid unintended  Weight gain.  - Patient is advised to stick to a routine mealtimes to eat 3 meals  a day and avoid unnecessary snacks ( to snack only to correct hypoglycemia).  - The patient  has been  scheduled with Jearld Fenton, RDN, CDE for individualized DM education.  - I have approached patient with the following individualized plan to manage diabetes and patient agrees. - He is doing better with U500 insulin.  - I will continue Humulin U500   60 units with breakfast, lunch, and  lower to 40 units with supper when pre-meal  blood glucose is above 90 mg/dL. - He is advised to continue strict monitoring of blood glucose before meals and at bedtime. -Patient is encouraged to call clinic for blood glucose levels less than 70 or above 200 mg /dl x 3. - I will continue low-dose metformin 500 mg by mouth twice a day. - He was  informed by his insurance that they would not pay for [facility], however will like to try it again. - Patient will be considered for incretin therapy as appropriate next visit. - Patient specific target  for A1c; LDL, HDL, Triglycerides, and  Waist Circumference were discussed in detail.  2) BP/HTN: Controlled. Continue current medications including ACEI/ARB. 3) Lipids/HPL:  continue statins. 4)  Weight/Diet: CDE consult in progress, exercise, and carbohydrates information provided.  5) hypothyroidism - His thyroid function tests are consistent with appropriate replacement. I will continue levothyroxine at 137 g by mouth every morning.  - We discussed about correct intake of levothyroxine, at fasting, with water, separated by at least 30 minutes from breakfast, and separated by more than 4 hours from calcium, iron, multivitamins, acid reflux medications (PPIs). -Patient is made aware of the fact that thyroid hormone replacement is needed for life, dose to be adjusted by periodic monitoring of thyroid function tests.  6) Chronic Care/Health Maintenance:  -Patient is on ACEI/ARB and Statin medications and encouraged to continue to follow up with Ophthalmology, Podiatrist at least yearly or according to recommendations, and advised to  stay away from smoking. I have recommended yearly flu vaccine and pneumonia vaccination at least every 5 years; moderate intensity exercise for up to 150 minutes weekly; and  sleep for at least 7 hours a day.  - 25 minutes of time was spent on the care of this patient , 50% of which was applied for counseling on diabetes complications and their preventions.  - I advised patient to maintain close follow up with [name], MD for primary care needs.  Patient is asked to bring meter and  blood glucose logs during their next visit.   Follow up plan: -Return in about 3 months (around 08/15/2016) for meter, and logs.  Glade Lloyd, MD Phone: [phone]  Fax:  [phone]   05/15/2016, 1:12 PM

## 2016-05-23 DIAGNOSIS — R3914 Feeling of incomplete bladder emptying: Secondary | ICD-10-CM | POA: Diagnosis not present

## 2016-05-23 DIAGNOSIS — E291 Testicular hypofunction: Secondary | ICD-10-CM | POA: Diagnosis not present

## 2016-05-23 DIAGNOSIS — N401 Enlarged prostate with lower urinary tract symptoms: Secondary | ICD-10-CM | POA: Diagnosis not present

## 2016-05-24 ENCOUNTER — Other Ambulatory Visit: Payer: Self-pay | Admitting: Neurology

## 2016-05-24 ENCOUNTER — Other Ambulatory Visit: Payer: Self-pay | Admitting: Family Medicine

## 2016-05-24 DIAGNOSIS — G2581 Restless legs syndrome: Secondary | ICD-10-CM

## 2016-05-24 MED ORDER — CLONAZEPAM 1 MG PO TABS: ORAL_TABLET | ORAL | 0 refills | Status: DC

## 2016-05-24 NOTE — Telephone Encounter (Signed)
RX for [employer] faxed to [facility]. Received a receipt of confirmation.

## 2016-05-24 NOTE — Telephone Encounter (Signed)
RX for klonopin from Dr. Brett Fairy printed on plain paper, as well as mine when I tried to reprint it. Third time, it printed on RX paper.

## 2016-05-25 DIAGNOSIS — E291 Testicular hypofunction: Secondary | ICD-10-CM | POA: Diagnosis not present

## 2016-05-30 DIAGNOSIS — R972 Elevated prostate specific antigen [PSA]: Secondary | ICD-10-CM | POA: Diagnosis not present

## 2016-05-30 DIAGNOSIS — E291 Testicular hypofunction: Secondary | ICD-10-CM | POA: Diagnosis not present

## 2016-06-09 ENCOUNTER — Other Ambulatory Visit: Payer: Self-pay | Admitting: Neurology

## 2016-06-09 ENCOUNTER — Other Ambulatory Visit: Payer: Self-pay | Admitting: "Endocrinology

## 2016-06-09 DIAGNOSIS — G2581 Restless legs syndrome: Secondary | ICD-10-CM

## 2016-06-13 DIAGNOSIS — E291 Testicular hypofunction: Secondary | ICD-10-CM | POA: Diagnosis not present

## 2016-06-21 ENCOUNTER — Other Ambulatory Visit: Payer: Self-pay | Admitting: Family Medicine

## 2016-06-21 ENCOUNTER — Other Ambulatory Visit: Payer: Self-pay | Admitting: "Endocrinology

## 2016-06-29 DIAGNOSIS — Z471 Aftercare following joint replacement surgery: Secondary | ICD-10-CM | POA: Diagnosis not present

## 2016-06-29 DIAGNOSIS — M79604 Pain in right leg: Secondary | ICD-10-CM | POA: Diagnosis not present

## 2016-06-29 DIAGNOSIS — M48061 Spinal stenosis, lumbar region without neurogenic claudication: Secondary | ICD-10-CM | POA: Diagnosis not present

## 2016-06-29 DIAGNOSIS — Z96651 Presence of right artificial knee joint: Secondary | ICD-10-CM | POA: Diagnosis not present

## 2016-07-03 ENCOUNTER — Other Ambulatory Visit: Payer: Self-pay | Admitting: "Endocrinology

## 2016-07-03 ENCOUNTER — Other Ambulatory Visit: Payer: Self-pay | Admitting: Neurology

## 2016-07-03 ENCOUNTER — Other Ambulatory Visit: Payer: Self-pay | Admitting: Family Medicine

## 2016-07-03 DIAGNOSIS — G2581 Restless legs syndrome: Secondary | ICD-10-CM

## 2016-07-03 NOTE — Telephone Encounter (Signed)
Filled Clonazepam. CD

## 2016-07-03 NOTE — Telephone Encounter (Signed)
fax'd prescription to pharmacy

## 2016-07-04 DIAGNOSIS — N499 Inflammatory disorder of unspecified male genital organ: Secondary | ICD-10-CM | POA: Diagnosis not present

## 2016-07-07 DIAGNOSIS — M545 Low back pain: Secondary | ICD-10-CM | POA: Diagnosis not present

## 2016-07-20 DIAGNOSIS — E291 Testicular hypofunction: Secondary | ICD-10-CM | POA: Diagnosis not present

## 2016-07-24 ENCOUNTER — Encounter: Payer: Self-pay | Admitting: Adult Health

## 2016-07-24 ENCOUNTER — Ambulatory Visit (INDEPENDENT_AMBULATORY_CARE_PROVIDER_SITE_OTHER): Payer: Medicare Other | Admitting: Adult Health

## 2016-07-24 VITALS — BP 129/70 | HR 77 | Wt 216.2 lb

## 2016-07-24 DIAGNOSIS — I25119 Atherosclerotic heart disease of native coronary artery with unspecified angina pectoris: Secondary | ICD-10-CM

## 2016-07-24 DIAGNOSIS — Z9989 Dependence on other enabling machines and devices: Secondary | ICD-10-CM | POA: Diagnosis not present

## 2016-07-24 DIAGNOSIS — G2581 Restless legs syndrome: Secondary | ICD-10-CM | POA: Diagnosis not present

## 2016-07-24 DIAGNOSIS — G4733 Obstructive sleep apnea (adult) (pediatric): Secondary | ICD-10-CM | POA: Diagnosis not present

## 2016-07-24 NOTE — Patient Instructions (Signed)
Your Plan:  Continue using CPAP nightly  Mask refitting with advance If your symptoms worsen or you develop new symptoms please let us know.   Thank you for coming to see [place] at [facility]. I hope we have been able to provide you high quality care today.  You may receive a patient satisfaction survey over the next few weeks. We would appreciate your feedback and comments so that we may continue to improve ourselves and the health of our patients.

## 2016-07-24 NOTE — Progress Notes (Signed)
PATIENT: Paul Baker DOB: 1950/05/14  REASON FOR VISIT: follow up- OSA on cpap, RLS HISTORY FROM: patient  HISTORY OF PRESENT ILLNESS: Mr. Derrick is a 66 year old male with a history of obstructive sleep apnea on CPAP and restless leg symptoms. He returns today for follow-up. His download indicates that he uses machine 27 out of 30 days for compliance of 90%. He uses machine greater than 4 hours 23 out of 30 days for compliance of 77%. On average he uses his machine 5 hours and 51 minutes. His residual AHI is 4.9 on a minimum pressure of 6 cm of water and maximum pressure of 16 cm of water with EPR of 3. The patient does have a leak in the 95th percentile at 32.6 L/m. The patient states that his mask tends to slide off a night. He reports that he does change out his supplies every 6 months. He reports that his restless leg symptoms have gotten worse in the last 3-4 months however his wife has noticed that when he does not get a good night sleep his symptoms are worse. He is currently taking Requip 4 mg half a tablet in the morning and one and a half tablets at bedtime. He reports that he is under a lot of stress as his house with imaging.  HISTORY Interval history from 01/24/2016, I have the pleasure to see Mr. Raczynski today for CPAP compliance visit. He has been highly compliant with CPAP use and used to machine 26 out of 30 days for those days under 4 hours. The overall compliance is 75%, with an average user time of 4 hours and 48 minutes. He is using an AutoSet between 6 and 16 cm water pressure with 3 cm EPR, or expiratory pressure relief. His AHI is 2.4 and most residual apneas are obstructive. The 95th percentile pressure is 12.7 cm water and therefore well covered in his current settings air leaks are minimal. He is interested in a dream wear mask, and does want to keep his beard.    REVIEW OF SYSTEMS: Out of a complete 14 system review of symptoms, the patient complains only  of the following symptoms, and all other reviewed systems are negative.  Joint pain, joint swelling, back pain, aching muscles, muscle cramps, walking difficulty, neck pain, moles, itching, restless leg, insomnia, apnea, daytime sleepiness, dizziness, headache, numbness, excessive thirst, eye discharge, eye itching, loss of vision, eye pain, blurred vision, leg swelling, ringing in ears  ALLERGIES: Allergies  Allergen Reactions  . Cardizem [Diltiazem Hcl]     Edema   . Cardura [Doxazosin Mesylate]     Side effects  . Lipitor [Atorvastatin] Other (See Comments)    Leg cramps  . Paxil [Paroxetine Hcl]     Side effects   . Adhesive [Tape] Rash  . Codeine Rash and Other (See Comments)    Headache     HOME MEDICATIONS: Outpatient Medications Prior to Visit  Medication Sig Dispense Refill  . albuterol (VENTOLIN HFA) 108 (90 Base) MCG/ACT inhaler Inhale 2 puffs into the lungs 4 (four) times daily as needed for wheezing or shortness of breath. 18 g 0  . amLODipine (NORVASC) 5 MG tablet TAKE ONE TABLET BY MOUTH ONCE DAILY. 30 tablet 6  . clobetasol (TEMOVATE) 0.05 % external solution Apply 1 application topically daily as needed (irritation.).     [name] clonazePAM (KLONOPIN) 1 MG tablet TAKE (1) TABLET BY MOUTH AT BEDTIME FOR RESTLESS LEGS. 30 tablet 2  . fenofibrate 160  MG tablet TAKE (1) TABLET BY MOUTH AT BEDTIME. 30 tablet 2  . furosemide (LASIX) 40 MG tablet Take 40 mg by mouth every other day.     [name] HUMULIN R U-500 KWIKPEN 500 UNIT/ML kwikpen INJECT 50 UNITS SQ 3 TIMES DAILY WITH MEALS. 12 mL 0  . isosorbide mononitrate (IMDUR) 60 MG 24 hr tablet TAKE (1/2) TABLET BY MOUTH TWICE DAILY. 90 tablet 2  . loratadine (CLARITIN) 10 MG tablet Take 10 mg by mouth at bedtime as needed for allergies.     [name] losartan (COZAAR) 50 MG tablet Take 50 mg by mouth at bedtime. Reported on 06/18/2015    . metFORMIN (GLUCOPHAGE) 500 MG tablet TAKE 2 TABLETS EACH MORNING, 1 TABLET AT NOON, AND 2 TABLETS EVERY  EVENING. 150 tablet 2  . metoprolol succinate (TOPROL-XL) 50 MG 24 hr tablet TAKE (1) TABLET BY MOUTH TWICE DAILY. 60 tablet 6  . modafinil (PROVIGIL) 200 MG tablet TAKE ONE TABLET BY MOUTH ONCE DAILY. 30 tablet 5  . nitroGLYCERIN (NITROSTAT) 0.4 MG SL tablet DISSOLVE 1 TABLET UNDER TONGUE EVERY 5 MINUTES UP TO 15 MIN FOR CHESTPAIN. IF NO RELIEF CALL 911. 25 tablet 3  . ONE TOUCH ULTRA TEST test strip TEST BLOOD SUGAR UP TO 4 TIMES DAILY. 150 each 5  . ONETOUCH DELICA LANCETS 16X MISC USE AS DIRECTED TO TEST BLOOD SUGAR 4 TIMES DAILY. 150 each 2  . pravastatin (PRAVACHOL) 20 MG tablet TAKE ONE TABLET BY MOUTH ONCE DAILY. 30 tablet 1  . rOPINIRole (REQUIP) 4 MG tablet TAKE 0.5 TABLET BY MOUTH at lunch time and 1.5 tablets at bedtime. 60 tablet 5  . sodium chloride (OCEAN) 0.65 % SOLN nasal spray Place 1-2 sprays into both nostrils daily as needed for congestion.    [name] SYNTHROID 137 MCG tablet TAKE (1) TABLET BY MOUTH EACH MORNING BEFORE BREAKFAST. 30 tablet 2  . Testosterone Cypionate & Prop 200-20 MG/ML SOLN Inject 1 mL into the muscle every 14 (fourteen) days.    . Vitamin D, Ergocalciferol, (DRISDOL) 50000 units CAPS capsule TAKE 1 CAPSULE BY MOUTH ONCE A WEEK. 12 capsule 0  . B-D ULTRAFINE III SHORT PEN 31G X 8 MM MISC USING FIVE TIMES DAILY. 100 each 5  . losartan (COZAAR) 100 MG tablet TAKE ONE TABLET BY MOUTH ONCE DAILY. 30 tablet 0   No facility-administered medications prior to visit.     PAST MEDICAL HISTORY: Past Medical History:  Diagnosis Date  . Arthritis   . Asthma   . Colon polyp   . Coronary atherosclerosis of native coronary artery    Diseased nondominant RCA  . DVT (deep venous thrombosis) ([place]) 2005   Right arm  . Essential hypertension, benign   . Headache   . History of transfusion   . Hypersomnia    CPAP of 16 cm, diagnosed with AHI of 60 in 2012,[place] 21- narcolepsy?  [name] Hypothyroidism   . Mixed hyperlipidemia   . Morbid obesity ([place])   . MRSA (methicillin  resistant staph aureus) culture positive    08/2012  . Narcolepsy   . OSA (obstructive sleep apnea)   . Pneumonia   . Psoriasis   . Pulmonary embolism ([place]) 2004  . RLS (restless legs syndrome)   . Rotator cuff disorder    Left  . Septic arthritis of knee, left ([place])   . Skin cancer, basal cell   . Type 2 diabetes mellitus ([place])     PAST SURGICAL HISTORY: Past Surgical History:  Procedure Laterality Date  . BACK SURGERY    . CATARACT EXTRACTION Bilateral   . COLONOSCOPY    . EYE SURGERY Left 2016   laser to left eye  . HIP SURGERY     bone removed from both sides of hip  . KNEE ARTHROTOMY Right 12/04/2014   Procedure: KNEE ARTHROTOMY PATELLA LIGAMENT RECONSTRUSION AND REPAIR RIGHT KNEE;  Surgeon: Paralee Cancel, MD;  Location: [facility];  Service: Orthopedics;  Laterality: Right;  . KNEE SURGERY     X 25 TIMES  . LUMBAR DISC SURGERY     Left L3, L4, L5 discecotomy with decompression of L4 root  . TONSILLECTOMY    . TOTAL KNEE ARTHROPLASTY  2003   LEFT  . TOTAL KNEE ARTHROPLASTY Right 03/23/2014   Procedure: RIGHT TOTAL KNEE ARTHROPLASTY AND REMOVAL RIGHT TIBIAL  DEEP IMPLANT STAPLE;  Surgeon: Paralee Cancel, MD;  Location: [facility];  Service: Orthopedics;  Laterality: Right;  . TOTAL KNEE REVISION  2005   LEFT  . WRIST SURGERY      FAMILY HISTORY: Family History  Problem Relation Age of Onset  . Hypertension Father   . Heart attack Father   . Kidney Stones Father   . Seizures Grandchild   . Narcolepsy Grandchild   . Diabetes Sister     SOCIAL HISTORY: Social History   Social History  . Marital status: Married    Spouse name: Toney Reil  . Number of children: 2  . Years of education: college   Occupational History  . Disabled   .  Unemployed   Social History Main Topics  . Smoking status: Former Smoker    Types: Cigarettes    Start date: 06/19/1967    Quit date: 01/03/1995  . Smokeless tobacco: Never Used  . Alcohol use No     Comment: quit drinking in 07/86  . Drug  use: No  . Sexual activity: Yes    Partners: Female   Other Topics Concern  . Not on file   Social History Narrative    66 year old, right-handed, caucasian male with a past medical history of obesity, hypertension, hyperlipidemia, diabetes, obstructive sleep apnea, presenting with frequent nighttime awakenings, excessive daytime sleepiness, also transient confusional episodes.RLS and one beosity, OSA on CPAP with AHI of 3.2 and  setting of 16 cm water , [facility] .      PHYSICAL EXAM  Vitals:   07/24/16 1256  BP: 129/70  Pulse: 77  Weight: 216 lb 3.2 oz (98.1 kg)   Body mass index is 35.98 kg/m.  Generalized: Well developed, in no acute distress   Neurological examination  Mentation: Alert oriented to time, place, history taking. Follows all commands speech and language fluent Cranial nerve II-XII: Pupils were equal round reactive to light. Extraocular movements were full, visual field were full on confrontational test. Facial sensation and strength were normal. Uvula tongue midline. Head turning and shoulder shrug  were normal and symmetric. Motor: The motor testing reveals 5 over 5 strength of all 4 extremities. Good symmetric motor tone is noted throughout.  Sensory: Sensory testing is intact to soft touch on all 4 extremities. No evidence of extinction is noted.  Coordination: Cerebellar testing reveals good finger-nose-finger and heel-to-shin bilaterally.  Gait and station: Gait is normal. Tandem gait is normal. Romberg is negative. No drift is seen.  Reflexes: Deep tendon reflexes are symmetric and normal bilaterally.   DIAGNOSTIC DATA (LABS, IMAGING, TESTING) - I reviewed patient records, labs, notes, testing and imaging myself  where available.  Lab Results  Component Value Date   WBC 8.2 12/05/2014   HGB 12.7 (L) 12/05/2014   HCT 40.5 12/05/2014   MCV 91.6 12/05/2014   PLT 197 12/05/2014      Component Value Date/Time   NA 139 05/04/2016 0916   NA 141  10/08/2013 1032   K 4.7 05/04/2016 0916   CL 103 05/04/2016 0916   CO2 27 05/04/2016 0916   GLUCOSE 159 (H) 05/04/2016 0916   BUN 30 (H) 05/04/2016 0916   BUN 28 (H) 10/08/2013 1032   CREATININE 1.40 (H) 05/04/2016 0916   CALCIUM 9.7 05/04/2016 0916   PROT 7.0 05/04/2016 0916   PROT 6.7 08/19/2015 0914   ALBUMIN 4.3 05/04/2016 0916   ALBUMIN 4.3 08/19/2015 0914   AST 20 05/04/2016 0916   ALT 17 05/04/2016 0916   ALKPHOS 37 (L) 05/04/2016 0916   BILITOT 0.4 05/04/2016 0916   BILITOT 0.3 08/19/2015 0914   GFRNONAA 79 10/12/2015 0852   GFRAA >89 10/12/2015 0852   Lab Results  Component Value Date   CHOL 148 08/19/2015   HDL 53 08/19/2015   LDLCALC 80 08/19/2015   TRIG 77 08/19/2015   CHOLHDL 2.8 08/19/2015   Lab Results  Component Value Date   HGBA1C 8.1 (H) 05/04/2016   No results found for: [id number] Lab Results  Component Value Date   TSH 0.33 (L) 01/13/2016      ASSESSMENT AND PLAN 66 y.o. year old male  has a past medical history of Arthritis; Asthma; Colon polyp; Coronary atherosclerosis of native coronary artery; DVT (deep venous thrombosis) ([place]) (2005); Essential hypertension, benign; Headache; History of transfusion; Hypersomnia; Hypothyroidism; Mixed hyperlipidemia; Morbid obesity ([place]); MRSA (methicillin resistant staph aureus) culture positive; Narcolepsy; OSA (obstructive sleep apnea); Pneumonia; Psoriasis; Pulmonary embolism ([place]) (2004); RLS (restless legs syndrome); Rotator cuff disorder; Septic arthritis of knee, left ([place]); Skin cancer, basal cell; and Type 2 diabetes mellitus ([place]). here with:  1. Obstructive sleep apnea on CPAP 2. Restless leg syndrome  The patient's CPAP download shows relatively good compliance although it could be improved. He does have a significant leak. I will send an order to his DME company to have his mask refitted and for new supplies. If the insurance will permit his supplies should be changed every 3 months to prevent a  week each. The patient will continue on his current dose of Requip 4 mg taking half a tablet in the morning and one and a half tablets at bedtime. If better compliance with CPAP is achieved but no improvement and restless leg symptoms and we will consider adjusting his medication. Patient voiced understanding. He will follow-up in 4 months with Dr. Mechele Claude, MSN, NP-C 07/24/2016, 1:08 PM [facility] [address], [place], [place] [address] [phone]

## 2016-07-24 NOTE — Progress Notes (Signed)
I agree with the assessment and plan as directed by NP .The patient is known to me .   Catheryn Slifer, MD  

## 2016-07-26 ENCOUNTER — Other Ambulatory Visit: Payer: Self-pay | Admitting: Family Medicine

## 2016-07-28 ENCOUNTER — Ambulatory Visit: Payer: Medicare Other | Admitting: Family Medicine

## 2016-07-31 ENCOUNTER — Ambulatory Visit: Payer: Medicare Other | Admitting: Family Medicine

## 2016-08-03 DIAGNOSIS — E291 Testicular hypofunction: Secondary | ICD-10-CM | POA: Diagnosis not present

## 2016-08-08 ENCOUNTER — Other Ambulatory Visit: Payer: Self-pay | Admitting: Neurology

## 2016-08-08 DIAGNOSIS — G2581 Restless legs syndrome: Secondary | ICD-10-CM

## 2016-08-15 ENCOUNTER — Ambulatory Visit: Payer: Medicare Other | Admitting: "Endocrinology

## 2016-08-17 DIAGNOSIS — E291 Testicular hypofunction: Secondary | ICD-10-CM | POA: Diagnosis not present

## 2016-08-18 DIAGNOSIS — R102 Pelvic and perineal pain: Secondary | ICD-10-CM | POA: Diagnosis not present

## 2016-08-23 DIAGNOSIS — M48061 Spinal stenosis, lumbar region without neurogenic claudication: Secondary | ICD-10-CM | POA: Diagnosis not present

## 2016-08-23 DIAGNOSIS — M545 Low back pain: Secondary | ICD-10-CM | POA: Diagnosis not present

## 2016-08-31 ENCOUNTER — Other Ambulatory Visit: Payer: Self-pay | Admitting: Family Medicine

## 2016-08-31 ENCOUNTER — Other Ambulatory Visit: Payer: Self-pay | Admitting: Neurology

## 2016-09-05 ENCOUNTER — Other Ambulatory Visit: Payer: Self-pay | Admitting: "Endocrinology

## 2016-09-05 DIAGNOSIS — E1159 Type 2 diabetes mellitus with other circulatory complications: Secondary | ICD-10-CM | POA: Diagnosis not present

## 2016-09-05 LAB — COMPREHENSIVE METABOLIC PANEL
ALBUMIN: 4.5 g/dL (ref 3.6–5.1)
ALK PHOS: 40 U/L (ref 40–115)
ALT: 11 U/L (ref 9–46)
AST: 13 U/L (ref 10–35)
BILIRUBIN TOTAL: 0.4 mg/dL (ref 0.2–1.2)
BUN: 32 mg/dL — ABNORMAL HIGH (ref 7–25)
CALCIUM: 9.4 mg/dL (ref 8.6–10.3)
CO2: 23 mmol/L (ref 20–32)
Chloride: 103 mmol/L (ref 98–110)
Creat: 1.23 mg/dL (ref 0.70–1.25)
Glucose, Bld: 338 mg/dL — ABNORMAL HIGH (ref 65–99)
Potassium: 4.5 mmol/L (ref 3.5–5.3)
Sodium: 136 mmol/L (ref 135–146)
Total Protein: 6.8 g/dL (ref 6.1–8.1)

## 2016-09-06 LAB — HEMOGLOBIN A1C
HEMOGLOBIN A1C: 9.2 % — AB (ref <5.7)
MEAN PLASMA GLUCOSE: 217 mg/dL

## 2016-09-07 DIAGNOSIS — E119 Type 2 diabetes mellitus without complications: Secondary | ICD-10-CM | POA: Diagnosis not present

## 2016-09-07 DIAGNOSIS — H35373 Puckering of macula, bilateral: Secondary | ICD-10-CM | POA: Diagnosis not present

## 2016-09-07 DIAGNOSIS — H4322 Crystalline deposits in vitreous body, left eye: Secondary | ICD-10-CM | POA: Diagnosis not present

## 2016-09-07 DIAGNOSIS — H02834 Dermatochalasis of left upper eyelid: Secondary | ICD-10-CM | POA: Diagnosis not present

## 2016-09-07 DIAGNOSIS — Z961 Presence of intraocular lens: Secondary | ICD-10-CM | POA: Diagnosis not present

## 2016-09-07 DIAGNOSIS — H02831 Dermatochalasis of right upper eyelid: Secondary | ICD-10-CM | POA: Diagnosis not present

## 2016-09-07 DIAGNOSIS — H35372 Puckering of macula, left eye: Secondary | ICD-10-CM | POA: Diagnosis not present

## 2016-09-07 LAB — HM DIABETES EYE EXAM

## 2016-09-08 DIAGNOSIS — M4807 Spinal stenosis, lumbosacral region: Secondary | ICD-10-CM | POA: Diagnosis not present

## 2016-09-08 DIAGNOSIS — M48061 Spinal stenosis, lumbar region without neurogenic claudication: Secondary | ICD-10-CM | POA: Diagnosis not present

## 2016-09-09 ENCOUNTER — Other Ambulatory Visit: Payer: Self-pay | Admitting: Family Medicine

## 2016-09-09 ENCOUNTER — Other Ambulatory Visit: Payer: Self-pay | Admitting: "Endocrinology

## 2016-09-09 ENCOUNTER — Other Ambulatory Visit: Payer: Self-pay | Admitting: Neurology

## 2016-09-09 DIAGNOSIS — G2581 Restless legs syndrome: Secondary | ICD-10-CM

## 2016-09-11 ENCOUNTER — Encounter: Payer: Self-pay | Admitting: *Deleted

## 2016-09-12 ENCOUNTER — Ambulatory Visit: Payer: Medicare Other | Admitting: "Endocrinology

## 2016-09-12 ENCOUNTER — Other Ambulatory Visit: Payer: Self-pay | Admitting: "Endocrinology

## 2016-09-13 DIAGNOSIS — E291 Testicular hypofunction: Secondary | ICD-10-CM | POA: Diagnosis not present

## 2016-09-25 ENCOUNTER — Encounter: Payer: Self-pay | Admitting: "Endocrinology

## 2016-09-25 ENCOUNTER — Ambulatory Visit (INDEPENDENT_AMBULATORY_CARE_PROVIDER_SITE_OTHER): Payer: Medicare Other | Admitting: "Endocrinology

## 2016-09-25 VITALS — BP 133/79 | HR 67 | Ht 65.0 in | Wt 218.0 lb

## 2016-09-25 DIAGNOSIS — E782 Mixed hyperlipidemia: Secondary | ICD-10-CM

## 2016-09-25 DIAGNOSIS — E038 Other specified hypothyroidism: Secondary | ICD-10-CM | POA: Diagnosis not present

## 2016-09-25 DIAGNOSIS — E291 Testicular hypofunction: Secondary | ICD-10-CM | POA: Diagnosis not present

## 2016-09-25 DIAGNOSIS — I25119 Atherosclerotic heart disease of native coronary artery with unspecified angina pectoris: Secondary | ICD-10-CM

## 2016-09-25 DIAGNOSIS — R972 Elevated prostate specific antigen [PSA]: Secondary | ICD-10-CM | POA: Diagnosis not present

## 2016-09-25 DIAGNOSIS — E1159 Type 2 diabetes mellitus with other circulatory complications: Secondary | ICD-10-CM | POA: Diagnosis not present

## 2016-09-25 DIAGNOSIS — I10 Essential (primary) hypertension: Secondary | ICD-10-CM

## 2016-09-25 MED ORDER — INSULIN REGULAR HUMAN (CONC) 500 UNIT/ML ~~LOC~~ SOPN: PEN_INJECTOR | SUBCUTANEOUS | 0 refills | Status: DC

## 2016-09-25 MED ORDER — FREESTYLE LIBRE SENSOR SYSTEM MISC: 2 refills | Status: DC

## 2016-09-25 MED ORDER — FREESTYLE LIBRE READER DEVI: 1.0000 | Freq: Once | 0 refills | Status: AC

## 2016-09-25 NOTE — Progress Notes (Signed)
Subjective:    Patient ID: [name], male    DOB: 1950-08-06, PCP [name], MD   Past Medical History:  Diagnosis Date  . Arthritis   . Asthma   . Colon polyp   . Coronary atherosclerosis of native coronary artery    Diseased nondominant RCA  . DVT (deep venous thrombosis) ([place]) 2005   Right arm  . Essential hypertension, benign   . Headache   . History of transfusion   . Hypersomnia    CPAP of 16 cm, diagnosed with AHI of 60 in 2012,[place] 21- narcolepsy?  [name] Hypothyroidism   . Mixed hyperlipidemia   . Morbid obesity ([place])   . MRSA (methicillin resistant staph aureus) culture positive    08/2012  . Narcolepsy   . OSA (obstructive sleep apnea)   . Pneumonia   . Psoriasis   . Pulmonary embolism ([place]) 2004  . RLS (restless legs syndrome)   . Rotator cuff disorder    Left  . Septic arthritis of knee, left ([place])   . Skin cancer, basal cell   . Type 2 diabetes mellitus ([place])    Past Surgical History:  Procedure Laterality Date  . BACK SURGERY    . CATARACT EXTRACTION Bilateral   . COLONOSCOPY    . EYE SURGERY Left 2016   laser to left eye  . HIP SURGERY     bone removed from both sides of hip  . KNEE ARTHROTOMY Right 12/04/2014   Procedure: KNEE ARTHROTOMY PATELLA LIGAMENT RECONSTRUSION AND REPAIR RIGHT KNEE;  Surgeon: Paralee Cancel, MD;  Location: [facility];  Service: Orthopedics;  Laterality: Right;  . KNEE SURGERY     X 25 TIMES  . LUMBAR DISC SURGERY     Left L3, L4, L5 discecotomy with decompression of L4 root  . TONSILLECTOMY    . TOTAL KNEE ARTHROPLASTY  2003   LEFT  . TOTAL KNEE ARTHROPLASTY Right 03/23/2014   Procedure: RIGHT TOTAL KNEE ARTHROPLASTY AND REMOVAL RIGHT TIBIAL  DEEP IMPLANT STAPLE;  Surgeon: Paralee Cancel, MD;  Location: [facility];  Service: Orthopedics;  Laterality: Right;  . TOTAL KNEE REVISION  2005   LEFT  . WRIST SURGERY     Social History   Social History  . Marital status: Married    Spouse name: Toney Reil  . Number of  children: 2  . Years of education: college   Occupational History  . Disabled   .  Unemployed   Social History Main Topics  . Smoking status: Former Smoker    Types: Cigarettes    Start date: 06/19/1967    Quit date: 01/03/1995  . Smokeless tobacco: Never Used  . Alcohol use No     Comment: quit drinking in 07/86  . Drug use: No  . Sexual activity: Yes    Partners: Female   Other Topics Concern  . None   Social History Narrative    66 year old, right-handed, caucasian male with a past medical history of obesity, hypertension, hyperlipidemia, diabetes, obstructive sleep apnea, presenting with frequent nighttime awakenings, excessive daytime sleepiness, also transient confusional episodes.RLS and one beosity, OSA on CPAP with AHI of 3.2 and  setting of 16 cm water , [facility] .   Outpatient Encounter Prescriptions as of 09/25/2016  Medication Sig  . albuterol (VENTOLIN HFA) 108 (90 Base) MCG/ACT inhaler Inhale 2 puffs into the lungs 4 (four) times daily as needed for wheezing or shortness of breath.  [name] amLODipine (NORVASC) 5 MG  tablet TAKE ONE TABLET BY MOUTH ONCE DAILY.  . clobetasol (TEMOVATE) 0.05 % external solution Apply 1 application topically daily as needed (irritation.).   [name] clonazePAM (KLONOPIN) 1 MG tablet TAKE (1) TABLET BY MOUTH AT BEDTIME FOR RESTLESS LEGS.  [name] Continuous Blood Gluc Receiver (FREESTYLE LIBRE READER) DEVI 1 Piece by Does not apply route once.  . Continuous Blood Gluc Sensor (FREESTYLE LIBRE SENSOR SYSTEM) MISC Use one sensor every 10 days.  . fenofibrate 160 MG tablet TAKE (1) TABLET BY MOUTH AT BEDTIME.  . furosemide (LASIX) 40 MG tablet Take 40 mg by mouth every other day.   . insulin regular human CONCENTRATED (HUMULIN R U-500 KWIKPEN) 500 UNIT/ML kwikpen Inject 70 units with breakfast, 70 units with lunch, and 40 units with supper if pre-meal blood glucose is above 90 mg/dL.  [name] isosorbide mononitrate (IMDUR) 60 MG 24 hr tablet TAKE (1/2) TABLET BY  MOUTH TWICE DAILY.  [name] loratadine (CLARITIN) 10 MG tablet Take 10 mg by mouth at bedtime as needed for allergies.   [name] losartan (COZAAR) 100 MG tablet TAKE ONE TABLET BY MOUTH ONCE DAILY.  [name] losartan (COZAAR) 50 MG tablet Take 50 mg by mouth at bedtime. Reported on 06/18/2015  . metFORMIN (GLUCOPHAGE) 500 MG tablet TAKE 2 TABLETS EACH MORNING, 1 TABLET AT NOON, AND 2 TABLETS EVERY EVENING.  . metoprolol succinate (TOPROL-XL) 50 MG 24 hr tablet TAKE (1) TABLET BY MOUTH TWICE DAILY.  . modafinil (PROVIGIL) 200 MG tablet TAKE ONE TABLET BY MOUTH ONCE DAILY.  . nitroGLYCERIN (NITROSTAT) 0.4 MG SL tablet DISSOLVE 1 TABLET UNDER TONGUE EVERY 5 MINUTES UP TO 15 MIN FOR CHESTPAIN. IF NO RELIEF CALL 911.  [name] ONE TOUCH ULTRA TEST test strip TEST BLOOD SUGAR UP TO 4 TIMES DAILY.  [name] ONETOUCH DELICA LANCETS 16X MISC USE AS DIRECTED TO TEST BLOOD SUGAR 4 TIMES DAILY.  . pravastatin (PRAVACHOL) 20 MG tablet TAKE (1) TABLET BY MOUTH ONCE DAILY.  [name] rOPINIRole (REQUIP) 4 MG tablet TAKE 1/2 TABLET BY MOUTH AT LUNCH AND 1 & 1/2 TABLETS BY MOUTH AT BEDTIME.  . sodium chloride (OCEAN) 0.65 % SOLN nasal spray Place 1-2 sprays into both nostrils daily as needed for congestion.  [name] SYNTHROID 137 MCG tablet TAKE (1) TABLET BY MOUTH EACH MORNING BEFORE BREAKFAST.  [name] Testosterone Cypionate & Prop 200-20 MG/ML SOLN Inject 1 mL into the muscle every 14 (fourteen) days.  . Vitamin D, Ergocalciferol, (DRISDOL) 50000 units CAPS capsule TAKE 1 CAPSULE BY MOUTH ONCE A WEEK.  . [DISCONTINUED] HUMULIN R U-500 KWIKPEN 500 UNIT/ML kwikpen INJECT 50 UNITS SQ 3 TIMES DAILY WITH MEALS. (Patient taking differently: INJECT 60 UNITS SQ 3 TIMES DAILY WITH MEALS.)   No facility-administered encounter medications on file as of 09/25/2016.    ALLERGIES: Allergies  Allergen Reactions  . Cardizem [Diltiazem Hcl]     Edema   . Cardura [Doxazosin Mesylate]     Side effects  . Lipitor [Atorvastatin] Other (See Comments)    Leg cramps  . Paxil  [Paroxetine Hcl]     Side effects   . Adhesive [Tape] Rash  . Codeine Rash and Other (See Comments)    Headache    VACCINATION STATUS: Immunization History  Administered Date(s) Administered  . Influenza Split 09/18/2012  . Influenza,inj,Quad PF,6+ Mos 10/13/2013, 10/06/2014  . Influenza-Unspecified 12/01/2015  . Pneumococcal Polysaccharide-23 10/02/2004, 10/03/2011  . Td 05/24/2006    Diabetes  He presents for his follow-up diabetic visit. He has type 2 diabetes mellitus.  Onset time: Was diagnosed at approximate age of 16 years. His disease course has been worsening. There are no hypoglycemic associated symptoms. Pertinent negatives for hypoglycemia include no confusion, headaches, pallor or seizures. Associated symptoms include polydipsia and polyuria. Pertinent negatives for diabetes include no chest pain, no fatigue, no polyphagia and no weakness. There are no hypoglycemic complications. Symptoms are worsening. Diabetic complications include heart disease. Risk factors for coronary artery disease include diabetes mellitus, dyslipidemia, male sex, obesity, sedentary lifestyle and tobacco exposure. Current diabetic treatment includes intensive insulin program. He is compliant with treatment most of the time. His weight is stable. He is following a generally unhealthy diet. He has had a previous visit with a dietitian. He never participates in exercise. There is no change in his home blood glucose trend. His breakfast blood glucose range is generally 180-200 mg/dl. His lunch blood glucose range is generally 180-200 mg/dl. His dinner blood glucose range is generally 180-200 mg/dl. His overall blood glucose range is 180-200 mg/dl. An ACE inhibitor/angiotensin II receptor blocker is being taken. He sees a podiatrist.Eye exam is current.  Hyperlipidemia  This is a chronic problem. The current episode started more than 1 year ago. Pertinent negatives include no chest pain, myalgias or shortness of  breath. Current antihyperlipidemic treatment includes statins. Risk factors for coronary artery disease include dyslipidemia, diabetes mellitus, hypertension, male sex, obesity and a sedentary lifestyle.  Hypertension  This is a chronic problem. The current episode started more than 1 year ago. Pertinent negatives include no chest pain, headaches, neck pain, palpitations or shortness of breath. Past treatments include angiotensin blockers. Hypertensive end-organ damage includes CAD/MI. Identifiable causes of hypertension include a thyroid problem.  Thyroid Problem  Presents for follow-up visit. Patient reports no constipation, diarrhea, fatigue or palpitations. The symptoms have been improving. Past treatments include levothyroxine. His past medical history is significant for hyperlipidemia.     Review of Systems  Constitutional: Negative for fatigue and unexpected weight change.  HENT: Negative for dental problem, mouth sores and trouble swallowing.   Eyes: Negative for visual disturbance.  Respiratory: Negative for cough, choking, chest tightness, shortness of breath and wheezing.   Cardiovascular: Negative for chest pain, palpitations and leg swelling.  Gastrointestinal: Negative for abdominal distention, abdominal pain, constipation, diarrhea, nausea and vomiting.  Endocrine: Positive for polydipsia and polyuria. Negative for polyphagia.  Genitourinary: Negative for dysuria, flank pain, hematuria and urgency.  Musculoskeletal: Negative for back pain, gait problem, myalgias and neck pain.  Skin: Negative for pallor, rash and wound.  Neurological: Negative for seizures, syncope, weakness, numbness and headaches.  Psychiatric/Behavioral: Negative.  Negative for confusion and dysphoric mood.    Objective:    BP 133/79   Pulse 67   Ht 5\' 5"  (1.651 m)   Wt 218 lb (98.9 kg)   BMI 36.28 kg/m   Wt Readings from Last 3 Encounters:  09/25/16 218 lb (98.9 kg)  07/24/16 216 lb 3.2 oz (98.1  kg)  05/15/16 215 lb (97.5 kg)    Physical Exam  Constitutional: He is oriented to person, place, and time. He appears well-developed and well-nourished. He is cooperative. No distress.  HENT:  Head: Normocephalic and atraumatic.  Eyes: EOM are normal.  Neck: Normal range of motion. Neck supple. No tracheal deviation present. No thyromegaly present.  Cardiovascular: Normal rate, S1 normal, S2 normal and normal heart sounds.  Exam reveals no gallop.   No murmur heard. Pulses:      Dorsalis pedis pulses are 1+ on the  right side, and 1+ on the left side.       Posterior tibial pulses are 1+ on the right side, and 1+ on the left side.  Pulmonary/Chest: Breath sounds normal. No respiratory distress. He has no wheezes.  Abdominal: Soft. Bowel sounds are normal. He exhibits no distension. There is no tenderness. There is no guarding and no CVA tenderness.  Musculoskeletal: He exhibits no edema.       Right shoulder: He exhibits no swelling and no deformity.  Neurological: He is alert and oriented to person, place, and time. He has normal strength and normal reflexes. No cranial nerve deficit or sensory deficit. Gait normal.  Skin: Skin is warm and dry. No rash noted. No cyanosis. Nails show no clubbing.  Psychiatric: He has a normal mood and affect. His speech is normal and behavior is normal. Judgment and thought content normal. Cognition and memory are normal.    Results for orders placed or performed in visit on 09/11/16  HM DIABETES EYE EXAM  Result Value Ref Range   HM Diabetic Eye Exam No Retinopathy No Retinopathy   Diabetic Labs (most recent): Lab Results  Component Value Date   HGBA1C 9.2 (H) 09/05/2016   HGBA1C 8.1 (H) 05/04/2016   HGBA1C 8.7 (H) 01/13/2016   Lipid profile (most recent): Lab Results  Component Value Date   TRIG 77 08/19/2015   CHOL 148 08/19/2015     Assessment & Plan:   1. Uncontrolled type 2 diabetes mellitus with other circulatory complication, with  long-term current use of insulin (HCC)   -His diabetes is  complicated by ordinary artery disease and patient remains at a high risk for more acute and chronic complications of diabetes which include CAD, CVA, CKD, retinopathy, and neuropathy. These are all discussed in detail with the patient.  Patient came with Fluctuating and above target glucose profile even  on a  largeer dose of insulin, and  recent A1c 9.2% increasing from 8.1%. - He did have 2 courses of steroid injections due to degenerative joint disease.  Glucose logs and insulin administration records pertaining to this visit,  to be scanned into patient's records.  Recent labs reviewed.   - I have re-counseled the patient on diet management and weight loss  by adopting a carbohydrate restricted / protein rich  Diet.  - Suggestion is made for patient to avoid simple carbohydrates   from their diet including Cakes , Desserts, Ice Cream,  Soda (  diet and regular) , Sweet Tea , Candies,  Chips, Cookies, Artificial Sweeteners,   and "Sugar-free" Products .  This will help patient to have stable blood glucose profile and potentially avoid unintended  Weight gain.  - Patient is advised to stick to a routine mealtimes to eat 3 meals  a day and avoid unnecessary snacks ( to snack only to correct hypoglycemia).   - I have approached patient with the following individualized plan to manage diabetes and patient agrees. - He is doing better with U500 insulin.  - I will increase the dose of his Humulin U500   To 70 units with breakfast, 70 units with lunch, and continue at 40 units with supper when pre-meal blood glucose is above 90 mg/dL. - He is advised to continue strict monitoring of blood glucose before meals and at bedtime. -Patient is encouraged to call clinic for blood glucose levels less than 70 or above 200 mg /dl x 3. - I will continue low-dose metformin 500 mg by mouth  twice a day. - He was informed by his insurance that they  would not pay for [facility], he would still benefit from continuous glucose monitoring. - I have discussed and initiated prescription for the [facility] device.  - Patient will be considered for incretin therapy as appropriate next visit. - Patient specific target  for A1c; LDL, HDL, Triglycerides, and  Waist Circumference were discussed in detail.  2) BP/HTN: Controlled. Continue current medications including ACEI/ARB. 3) Lipids/HPL:  continue statins. 4)  Weight/Diet: CDE consult in progress, exercise, and carbohydrates information provided.  5) hypothyroidism - His thyroid function tests are consistent with appropriate replacement. I will continue levothyroxine at 137 g by mouth every morning.  - We discussed about correct intake of levothyroxine, at fasting, with water, separated by at least 30 minutes from breakfast, and separated by more than 4 hours from calcium, iron, multivitamins, acid reflux medications (PPIs). -Patient is made aware of the fact that thyroid hormone replacement is needed for life, dose to be adjusted by periodic monitoring of thyroid function tests.  6) Chronic Care/Health Maintenance:  -Patient is on ACEI/ARB and Statin medications and encouraged to continue to follow up with Ophthalmology, Podiatrist at least yearly or according to recommendations, and advised to  stay away from smoking. I have recommended yearly flu vaccine and pneumonia vaccination at least every 5 years; moderate intensity exercise for up to 150 minutes weekly; and  sleep for at least 7 hours a day.  - Time spent with the patient: 25 min, of which >50% was spent in reviewing his sugar logs , discussing his hypo- and hyper-glycemic episodes, reviewing his current and  previous labs and insulin doses and developing a plan to avoid hypo- and hyper-glycemia.    - I advised patient to maintain close follow up with [name], MD for primary care needs.   Follow up plan: -Return in  about 3 months (around 12/25/2016) for meter, and logs.  Glade Lloyd, MD Phone: [phone]  Fax: [phone]  This note was partially dictated with voice recognition software. Similar sounding words can be transcribed inadequately or may not  be corrected upon review.  09/25/2016, 2:46 PM

## 2016-09-25 NOTE — Patient Instructions (Signed)

## 2016-09-26 DIAGNOSIS — H53489 Generalized contraction of visual field, unspecified eye: Secondary | ICD-10-CM | POA: Diagnosis not present

## 2016-09-26 DIAGNOSIS — H02831 Dermatochalasis of right upper eyelid: Secondary | ICD-10-CM | POA: Diagnosis not present

## 2016-09-26 DIAGNOSIS — H02423 Myogenic ptosis of bilateral eyelids: Secondary | ICD-10-CM | POA: Diagnosis not present

## 2016-09-26 DIAGNOSIS — H02413 Mechanical ptosis of bilateral eyelids: Secondary | ICD-10-CM | POA: Diagnosis not present

## 2016-09-26 DIAGNOSIS — H02834 Dermatochalasis of left upper eyelid: Secondary | ICD-10-CM | POA: Diagnosis not present

## 2016-09-27 DIAGNOSIS — E291 Testicular hypofunction: Secondary | ICD-10-CM | POA: Diagnosis not present

## 2016-09-29 ENCOUNTER — Other Ambulatory Visit: Payer: Self-pay | Admitting: Family Medicine

## 2016-10-04 ENCOUNTER — Other Ambulatory Visit: Payer: Self-pay | Admitting: "Endocrinology

## 2016-10-05 DIAGNOSIS — M545 Low back pain: Secondary | ICD-10-CM | POA: Diagnosis not present

## 2016-10-09 ENCOUNTER — Other Ambulatory Visit: Payer: Self-pay | Admitting: Cardiology

## 2016-10-09 ENCOUNTER — Other Ambulatory Visit: Payer: Self-pay | Admitting: Family Medicine

## 2016-10-09 ENCOUNTER — Other Ambulatory Visit: Payer: Self-pay | Admitting: "Endocrinology

## 2016-10-10 DIAGNOSIS — E291 Testicular hypofunction: Secondary | ICD-10-CM | POA: Diagnosis not present

## 2016-10-23 ENCOUNTER — Other Ambulatory Visit: Payer: Self-pay | Admitting: Neurology

## 2016-10-24 ENCOUNTER — Telehealth: Payer: Self-pay

## 2016-10-24 DIAGNOSIS — E291 Testicular hypofunction: Secondary | ICD-10-CM | POA: Diagnosis not present

## 2016-10-24 NOTE — Telephone Encounter (Signed)
I received a prior auth request for his modafinil 200mg . I have completed and submitted the PA and should have a determination within 48-72 hours.

## 2016-10-30 NOTE — Telephone Encounter (Signed)
PA approved for Modafinil from 10/24/2016-10/24/2017.  [id number].

## 2016-10-31 DIAGNOSIS — H02413 Mechanical ptosis of bilateral eyelids: Secondary | ICD-10-CM | POA: Diagnosis not present

## 2016-10-31 DIAGNOSIS — H02423 Myogenic ptosis of bilateral eyelids: Secondary | ICD-10-CM | POA: Diagnosis not present

## 2016-11-06 ENCOUNTER — Other Ambulatory Visit: Payer: Self-pay | Admitting: "Endocrinology

## 2016-11-08 ENCOUNTER — Other Ambulatory Visit: Payer: Self-pay | Admitting: Family Medicine

## 2016-11-08 DIAGNOSIS — Z23 Encounter for immunization: Secondary | ICD-10-CM | POA: Diagnosis not present

## 2016-11-15 ENCOUNTER — Other Ambulatory Visit: Payer: Self-pay | Admitting: Cardiology

## 2016-11-15 ENCOUNTER — Other Ambulatory Visit: Payer: Self-pay | Admitting: Family Medicine

## 2016-11-21 ENCOUNTER — Other Ambulatory Visit: Payer: Self-pay | Admitting: "Endocrinology

## 2016-11-21 ENCOUNTER — Other Ambulatory Visit: Payer: Self-pay | Admitting: Neurology

## 2016-11-22 ENCOUNTER — Telehealth: Payer: Self-pay

## 2016-11-22 NOTE — Telephone Encounter (Signed)
Modafinil fax to pharmacy at [phone]. Fax receive and confirmed.

## 2016-11-24 ENCOUNTER — Other Ambulatory Visit: Payer: Self-pay | Admitting: "Endocrinology

## 2016-11-28 DIAGNOSIS — N5201 Erectile dysfunction due to arterial insufficiency: Secondary | ICD-10-CM | POA: Diagnosis not present

## 2016-11-28 DIAGNOSIS — E291 Testicular hypofunction: Secondary | ICD-10-CM | POA: Diagnosis not present

## 2016-11-28 DIAGNOSIS — R3914 Feeling of incomplete bladder emptying: Secondary | ICD-10-CM | POA: Diagnosis not present

## 2016-11-28 DIAGNOSIS — N401 Enlarged prostate with lower urinary tract symptoms: Secondary | ICD-10-CM | POA: Diagnosis not present

## 2016-11-29 ENCOUNTER — Ambulatory Visit (INDEPENDENT_AMBULATORY_CARE_PROVIDER_SITE_OTHER): Payer: Medicare Other | Admitting: Adult Health

## 2016-11-29 ENCOUNTER — Encounter: Payer: Self-pay | Admitting: Adult Health

## 2016-11-29 VITALS — BP 140/79 | HR 70 | Ht 65.0 in | Wt 221.0 lb

## 2016-11-29 DIAGNOSIS — G2581 Restless legs syndrome: Secondary | ICD-10-CM | POA: Diagnosis not present

## 2016-11-29 DIAGNOSIS — G4733 Obstructive sleep apnea (adult) (pediatric): Secondary | ICD-10-CM

## 2016-11-29 DIAGNOSIS — I25119 Atherosclerotic heart disease of native coronary artery with unspecified angina pectoris: Secondary | ICD-10-CM | POA: Diagnosis not present

## 2016-11-29 DIAGNOSIS — Z9989 Dependence on other enabling machines and devices: Secondary | ICD-10-CM

## 2016-11-29 DIAGNOSIS — S93412A Sprain of calcaneofibular ligament of left ankle, initial encounter: Secondary | ICD-10-CM | POA: Diagnosis not present

## 2016-11-29 DIAGNOSIS — S99812A Other specified injuries of left ankle, initial encounter: Secondary | ICD-10-CM | POA: Diagnosis not present

## 2016-11-29 MED ORDER — ROPINIROLE HCL 4 MG PO TABS: ORAL_TABLET | ORAL | 5 refills | Status: DC

## 2016-11-29 MED ORDER — ROPINIROLE HCL ER 2 MG PO TB24
2.0000 mg | ORAL_TABLET | Freq: Every day | ORAL | 5 refills | Status: DC
Start: 2016-11-29 — End: 2017-06-22

## 2016-11-29 NOTE — Progress Notes (Signed)
PATIENT: Paul Baker DOB: 1950-06-15  REASON FOR VISIT: follow up HISTORY FROM: patient  HISTORY OF PRESENT ILLNESS: Today 11/29/16 [name] is a 66 year old male with a history of obstructive sleep apnea on CPAP and restless leg syndrome.  He returns today for follow-up.  His download indicates that he use his machine 26 out of 30 days for compliance of 87%.  He uses machine greater than 4 hours 21 out of 30 days for compliance of 70%.  On average he uses his machine 5 hours and his residual AHI is 4.9 on a minimum pressure of 6 cm of water and maximum pressure of 16 cm of water with EPR of 3.  The patient's leak in the 95th percentile is 35.5 L/min.  The patient states that his mask was never refitted despite orders being sent at the last 2 visits. He reports that his restless legs has increased.  He states in the afternoons his symptoms have increased.  He also states that he typically wakes up around 4 AM with symptoms.  He continues to take Requip 4 mg-half a tablet at noon and 1-1/2 tablets at bedtime.  He returns today for an evaluation.   HISTORY 07/24/16:[name] is a 66 year old male with a history of obstructive sleep apnea on CPAP and restless leg symptoms. He returns today for follow-up. His download indicates that he uses machine 27 out of 30 days for compliance of 90%. He uses machine greater than 4 hours 23 out of 30 days for compliance of 77%. On average he uses his machine 5 hours and 51 minutes. His residual AHI is 4.9 on a minimum pressure of 6 cm of water and maximum pressure of 16 cm of water with EPR of 3. The patient does have a leak in the 95th percentile at 32.6 L/m. The patient states that his mask tends to slide off a night. He reports that he does change out his supplies every 6 months. He reports that his restless leg symptoms have gotten worse in the last 3-4 months however his wife has noticed that when he does not get a good night sleep his symptoms  are worse. He is currently taking Requip 4 mg half a tablet in the morning and one and a half tablets at bedtime. He reports that he is under a lot of stress as his house with imaging.  REVIEW OF SYSTEMS: Out of a complete 14 system review of symptoms, the patient complains only of the following symptoms, and all other reviewed systems are negative.  Epworth sleepiness score 20, fatigue severity score 44  ALLERGIES: Allergies  Allergen Reactions  . Cardizem [Diltiazem Hcl]     Edema   . Cardura [Doxazosin Mesylate]     Side effects  . Lipitor [Atorvastatin] Other (See Comments)    Leg cramps  . Paxil [Paroxetine Hcl]     Side effects   . Adhesive [Tape] Rash  . Codeine Rash and Other (See Comments)    Headache     HOME MEDICATIONS: Outpatient Medications Prior to Visit  Medication Sig Dispense Refill  . albuterol (VENTOLIN HFA) 108 (90 Base) MCG/ACT inhaler Inhale 2 puffs into the lungs 4 (four) times daily as needed for wheezing or shortness of breath. 18 g 0  . amLODipine (NORVASC) 5 MG tablet TAKE ONE TABLET BY MOUTH ONCE DAILY. 30 tablet 0  . B-D ULTRAFINE III SHORT PEN 31G X 8 MM MISC USING FIVE TIMES DAILY. 150 each 5  .  clobetasol (TEMOVATE) 0.05 % external solution Apply 1 application topically daily as needed (irritation.).     [name] clonazePAM (KLONOPIN) 1 MG tablet TAKE (1) TABLET BY MOUTH AT BEDTIME FOR RESTLESS LEGS. 30 tablet 2  . Continuous Blood Gluc Sensor (FREESTYLE LIBRE SENSOR SYSTEM) MISC Use one sensor every 10 days. 3 each 2  . fenofibrate 160 MG tablet TAKE (1) TABLET BY MOUTH AT BEDTIME. 30 tablet 5  . furosemide (LASIX) 40 MG tablet Take 40 mg by mouth every other day.     [name] HUMULIN R U-500 KWIKPEN 500 UNIT/ML kwikpen INJECT 70 UNITS WITH BREAKFAST, 70 UNITS WITH LUNCH AND 40 UNITS WITHSUPPER IF PRE-MEAL BLOOD SUGAR IS ABOVE 90MG /DL. 12 mL 0  . isosorbide mononitrate (IMDUR) 60 MG 24 hr tablet TAKE (1/2) TABLET BY MOUTH TWICE DAILY. 90 tablet 2  . loratadine  (CLARITIN) 10 MG tablet Take 10 mg by mouth at bedtime as needed for allergies.     [name] losartan (COZAAR) 100 MG tablet TAKE ONE TABLET BY MOUTH ONCE DAILY. 30 tablet 0  . losartan (COZAAR) 50 MG tablet Take 50 mg by mouth at bedtime. Reported on 06/18/2015    . metFORMIN (GLUCOPHAGE) 500 MG tablet TAKE 2 TABLETS EACH MORNING, 1 TABLET AT NOON, AND 2 TABLETS EVERY EVENING. 150 tablet 5  . metoprolol succinate (TOPROL-XL) 50 MG 24 hr tablet TAKE (1) TABLET BY MOUTH TWICE DAILY. 60 tablet 0  . modafinil (PROVIGIL) 200 MG tablet TAKE ONE TABLET BY MOUTH ONCE DAILY. 30 tablet 5  . nitroGLYCERIN (NITROSTAT) 0.4 MG SL tablet DISSOLVE 1 TABLET UNDER TONGUE EVERY 5 MINUTES UP TO 15 MIN FOR CHESTPAIN. IF NO RELIEF CALL 911. 25 tablet 3  . ONE TOUCH ULTRA TEST test strip TEST BLOOD SUGAR UP TO 4 TIMES DAILY. 150 each 5  . ONETOUCH DELICA LANCETS 35K MISC USE AS DIRECTED TO TEST BLOOD SUGAR 4 TIMES DAILY. 150 each 5  . pravastatin (PRAVACHOL) 20 MG tablet TAKE (1) TABLET BY MOUTH ONCE DAILY. 30 tablet 5  . rOPINIRole (REQUIP) 4 MG tablet TAKE 1/2 TABLET BY MOUTH AT LUNCH AND 1 & 1/2 TABLETS BY MOUTH AT BEDTIME. 60 tablet 4  . sodium chloride (OCEAN) 0.65 % SOLN nasal spray Place 1-2 sprays into both nostrils daily as needed for congestion.    [name] SYNTHROID 137 MCG tablet TAKE (1) TABLET BY MOUTH EACH MORNING BEFORE BREAKFAST. 30 tablet 3  . Testosterone Cypionate & Prop 200-20 MG/ML SOLN Inject 1 mL into the muscle every 14 (fourteen) days.    . Vitamin D, Ergocalciferol, (DRISDOL) 50000 units CAPS capsule TAKE 1 CAPSULE BY MOUTH ONCE A WEEK. 12 capsule 0   No facility-administered medications prior to visit.     PAST MEDICAL HISTORY: Past Medical History:  Diagnosis Date  . Arthritis   . Asthma   . Colon polyp   . Coronary atherosclerosis of native coronary artery    Diseased nondominant RCA  . DVT (deep venous thrombosis) ([place]) 2005   Right arm  . Essential hypertension, benign   . Headache   .  History of transfusion   . Hypersomnia    CPAP of 16 cm, diagnosed with AHI of 60 in 2012,epworth 21- narcolepsy?  [name] Hypothyroidism   . Mixed hyperlipidemia   . Morbid obesity ([place])   . MRSA (methicillin resistant staph aureus) culture positive    08/2012  . Narcolepsy   . OSA (obstructive sleep apnea)   . Pneumonia   . Psoriasis   .  Pulmonary embolism ([place]) 2004  . RLS (restless legs syndrome)   . Rotator cuff disorder    Left  . Septic arthritis of knee, left ([place])   . Skin cancer, basal cell   . Type 2 diabetes mellitus ([place])     PAST SURGICAL HISTORY: Past Surgical History:  Procedure Laterality Date  . BACK SURGERY    . CATARACT EXTRACTION Bilateral   . COLONOSCOPY    . EYE SURGERY Left 2016   laser to left eye  . HIP SURGERY     bone removed from both sides of hip  . KNEE ARTHROTOMY Right 12/04/2014   Procedure: KNEE ARTHROTOMY PATELLA LIGAMENT RECONSTRUSION AND REPAIR RIGHT KNEE;  Surgeon: Paralee Cancel, MD;  Location: [facility];  Service: Orthopedics;  Laterality: Right;  . KNEE SURGERY     X 25 TIMES  . LUMBAR DISC SURGERY     Left L3, L4, L5 discecotomy with decompression of L4 root  . TONSILLECTOMY    . TOTAL KNEE ARTHROPLASTY  2003   LEFT  . TOTAL KNEE ARTHROPLASTY Right 03/23/2014   Procedure: RIGHT TOTAL KNEE ARTHROPLASTY AND REMOVAL RIGHT TIBIAL  DEEP IMPLANT STAPLE;  Surgeon: Paralee Cancel, MD;  Location: [facility];  Service: Orthopedics;  Laterality: Right;  . TOTAL KNEE REVISION  2005   LEFT  . WRIST SURGERY      FAMILY HISTORY: Family History  Problem Relation Age of Onset  . Hypertension Father   . Heart attack Father   . Kidney Stones Father   . Seizures Grandchild   . Narcolepsy Grandchild   . Diabetes Sister     SOCIAL HISTORY: Social History   Socioeconomic History  . Marital status: Married    Spouse name: Toney Reil  . Number of children: 2  . Years of education: college  . Highest education level: Not on file  Social Needs  . Financial  resource strain: Not on file  . Food insecurity - worry: Not on file  . Food insecurity - inability: Not on file  . Transportation needs - medical: Not on file  . Transportation needs - non-medical: Not on file  Occupational History  . Occupation: Disabled    Employer: UNEMPLOYED  Tobacco Use  . Smoking status: Former Smoker    Types: Cigarettes    Start date: 06/19/1967    Last attempt to quit: 01/03/1995    Years since quitting: 21.9  . Smokeless tobacco: Never Used  Substance and Sexual Activity  . Alcohol use: No    Alcohol/week: 0.0 oz    Comment: quit drinking in 07/86  . Drug use: No  . Sexual activity: Yes    Partners: Female  Other Topics Concern  . Not on file  Social History Narrative    66 year old, right-handed, caucasian male with a past medical history of obesity, hypertension, hyperlipidemia, diabetes, obstructive sleep apnea, presenting with frequent nighttime awakenings, excessive daytime sleepiness, also transient confusional episodes.RLS and one beosity, OSA on CPAP with AHI of 3.2 and  setting of 16 cm water , [facility] .      PHYSICAL EXAM  There were no vitals filed for this visit. There is no height or weight on file to calculate BMI.  Generalized: Well developed, in no acute distress   Neurological examination  Mentation: Alert oriented to time, place, history taking. Follows all commands speech and language fluent Cranial nerve II-XII: Pupils were equal round reactive to light. Extraocular movements were full, visual field were full on confrontational  test. Facial sensation and strength were normal. Uvula tongue midline. Head turning and shoulder shrug  were normal and symmetric. Motor: The motor testing reveals 5 over 5 strength of all 4 extremities. Good symmetric motor tone is noted throughout.  Sensory: Sensory testing is intact to soft touch on all 4 extremities. No evidence of extinction is noted.  Coordination: Cerebellar testing reveals  good finger-nose-finger and heel-to-shin bilaterally.  Gait and station: Gait is normal.  Reflexes: Deep tendon reflexes are symmetric and normal bilaterally.   DIAGNOSTIC DATA (LABS, IMAGING, TESTING) - I reviewed patient records, labs, notes, testing and imaging myself where available.  Lab Results  Component Value Date   WBC 8.2 12/05/2014   HGB 12.7 (L) 12/05/2014   HCT 40.5 12/05/2014   MCV 91.6 12/05/2014   PLT 197 12/05/2014      Component Value Date/Time   NA 136 09/05/2016 0834   NA 141 10/08/2013 1032   K 4.5 09/05/2016 0834   CL 103 09/05/2016 0834   CO2 23 09/05/2016 0834   GLUCOSE 338 (H) 09/05/2016 0834   BUN 32 (H) 09/05/2016 0834   BUN 28 (H) 10/08/2013 1032   CREATININE 1.23 09/05/2016 0834   CALCIUM 9.4 09/05/2016 0834   PROT 6.8 09/05/2016 0834   PROT 6.7 08/19/2015 0914   ALBUMIN 4.5 09/05/2016 0834   ALBUMIN 4.3 08/19/2015 0914   AST 13 09/05/2016 0834   ALT 11 09/05/2016 0834   ALKPHOS 40 09/05/2016 0834   BILITOT 0.4 09/05/2016 0834   BILITOT 0.3 08/19/2015 0914   GFRNONAA 79 10/12/2015 0852   GFRAA >89 10/12/2015 0852   Lab Results  Component Value Date   CHOL 148 08/19/2015   HDL 53 08/19/2015   LDLCALC 80 08/19/2015   TRIG 77 08/19/2015   CHOLHDL 2.8 08/19/2015   Lab Results  Component Value Date   HGBA1C 9.2 (H) 09/05/2016   No results found for: VITAMINB12 Lab Results  Component Value Date   TSH 0.33 (L) 01/13/2016      ASSESSMENT AND PLAN 66 y.o. year old male  has a past medical history of Arthritis, Asthma, Colon polyp, Coronary atherosclerosis of native coronary artery, DVT (deep venous thrombosis) ([place]) (2005), Essential hypertension, benign, Headache, History of transfusion, Hypersomnia, Hypothyroidism, Mixed hyperlipidemia, Morbid obesity ([place]), MRSA (methicillin resistant staph aureus) culture positive, Narcolepsy, OSA (obstructive sleep apnea), Pneumonia, Psoriasis, Pulmonary embolism ([place]) (2004), RLS (restless legs  syndrome), Rotator cuff disorder, Septic arthritis of knee, left ([place]), Skin cancer, basal cell, and Type 2 diabetes mellitus ([place]). here with :  1.  Obstructive sleep apnea on CPAP 2.  Restless leg syndrome  The patient CPAP download shows excellent compliance and good treatment of his apnea.  He does have a leak.  I will send another order to his DME company to have his mask refitted.  I have advised the patient that if he is not heard from his DME company in 2 days to call our office.  I will also adjust his Requip dose.  He will continue taking Requip 4 mg-half a tablet at noon and 1 tablet at dinnertime.  I will send in a new prescription for Requip extended release 2 mg to take before bedtime.  The patient will let me know if this is not beneficial.  He will follow-up in 6 months or sooner if needed.   Ward Givens, MSN, NP-C 11/29/2016, 10:50 AM [facility] [address], [place], [place] [address] [phone]

## 2016-11-29 NOTE — Patient Instructions (Addendum)
Your Plan:  Continue using CPAP nightly- mask refitting Change requip 4mg : take 1/2 Tablet at lunch and 1 tablet at dinner time New prescription: Requip XR 2 mg: take before bedtime If your symptoms worsen or you develop new symptoms please let us know.   Thank you for coming to see [place] at [facility]. I hope we have been able to provide you high quality care today.  You may receive a patient satisfaction survey over the next few weeks. We would appreciate your feedback and comments so that we may continue to improve ourselves and the health of our patients.

## 2016-11-29 NOTE — Progress Notes (Signed)
DME order for CPAP mask refitting successfully faxed to [facility].

## 2016-11-29 NOTE — Progress Notes (Signed)
I agree with the assessment and plan as directed by NP .The patient is known to me . He needs a more responsive DME- please discuss with Shirlean Mylar where to send him.    Redith Drach, MD

## 2016-11-30 DIAGNOSIS — R972 Elevated prostate specific antigen [PSA]: Secondary | ICD-10-CM | POA: Diagnosis not present

## 2016-11-30 DIAGNOSIS — E291 Testicular hypofunction: Secondary | ICD-10-CM | POA: Diagnosis not present

## 2016-12-01 ENCOUNTER — Ambulatory Visit (HOSPITAL_COMMUNITY): Payer: Medicare Other | Attending: Orthopedic Surgery | Admitting: Physical Therapy

## 2016-12-01 ENCOUNTER — Encounter (HOSPITAL_COMMUNITY): Payer: Self-pay | Admitting: Physical Therapy

## 2016-12-01 DIAGNOSIS — M25572 Pain in left ankle and joints of left foot: Secondary | ICD-10-CM | POA: Diagnosis not present

## 2016-12-01 DIAGNOSIS — M25672 Stiffness of left ankle, not elsewhere classified: Secondary | ICD-10-CM

## 2016-12-01 DIAGNOSIS — R262 Difficulty in walking, not elsewhere classified: Secondary | ICD-10-CM | POA: Insufficient documentation

## 2016-12-01 NOTE — Therapy (Signed)
[place], [place], [address] Phone: [phone]   Fax:  [phone]  Physical Therapy Evaluation  Patient Details  Name: ALECXANDER MAINWARING MRN: [id number] Date of Birth: 1950-12-15 Referring Provider: Victorino December   Encounter Date: 12/01/2016  PT End of Session - 12/01/16 1112    Visit Number  1    Number of Visits  12    Date for PT Re-Evaluation  12/31/16    Authorization Type  medicare    Authorization - Visit Number  1    Authorization - Number of Visits  10    PT Start Time  0623    PT Stop Time  1110    PT Time Calculation (min)  35 min    Activity Tolerance  Patient tolerated treatment well    Behavior During Therapy  [place] for tasks assessed/performed       Past Medical History:  Diagnosis Date  . Arthritis   . Asthma   . Colon polyp   . Coronary atherosclerosis of native coronary artery    Diseased nondominant RCA  . DVT (deep venous thrombosis) ([place]) 2005   Right arm  . Essential hypertension, benign   . Headache   . History of transfusion   . Hypersomnia    CPAP of 16 cm, diagnosed with AHI of 60 in 2012,epworth 21- narcolepsy?  [name] Hypothyroidism   . Mixed hyperlipidemia   . Morbid obesity ([place])   . MRSA (methicillin resistant staph aureus) culture positive    08/2012  . Narcolepsy   . OSA (obstructive sleep apnea)   . Pneumonia   . Psoriasis   . Pulmonary embolism ([place]) 2004  . RLS (restless legs syndrome)   . Rotator cuff disorder    Left  . Septic arthritis of knee, left ([place])   . Skin cancer, basal cell   . Type 2 diabetes mellitus ([place])     Past Surgical History:  Procedure Laterality Date  . BACK SURGERY    . CATARACT EXTRACTION Bilateral   . COLONOSCOPY    . EYE SURGERY Left 2016   laser to left eye  . HIP SURGERY     bone removed from both sides of hip  . KNEE ARTHROTOMY Right 12/04/2014   Procedure: KNEE ARTHROTOMY PATELLA LIGAMENT RECONSTRUSION AND REPAIR RIGHT  KNEE;  Surgeon: Paralee Cancel, MD;  Location: [facility];  Service: Orthopedics;  Laterality: Right;  . KNEE SURGERY     X 25 TIMES  . LUMBAR DISC SURGERY     Left L3, L4, L5 discecotomy with decompression of L4 root  . TONSILLECTOMY    . TOTAL KNEE ARTHROPLASTY  2003   LEFT  . TOTAL KNEE ARTHROPLASTY Right 03/23/2014   Procedure: RIGHT TOTAL KNEE ARTHROPLASTY AND REMOVAL RIGHT TIBIAL  DEEP IMPLANT STAPLE;  Surgeon: Paralee Cancel, MD;  [facility];  Service: Orthopedics;  Laterality: Right;  . TOTAL KNEE REVISION  2005   LEFT  . WRIST SURGERY      There were no vitals filed for this visit.   Subjective Assessment - 12/01/16 1032    Subjective  Mr. Colden states that he injured his LT ankle 04/16/2016 when he stepped in a hole.  He had no therapy, he thought that it would work out but since he was still having difficulty walking he went to the MD who referred him to therapy.     Pertinent History  DM, Rt TKR, Spinal stenosis, CAD,  rotator cuff disorder.     Limitations  Standing;Walking;House hold activities    How long can you stand comfortably?  immediately has increased pain     How long can you walk comfortably?  Pt has increased pain immediately upon weight bearing but will continue to do what he needs to get done.     Patient Stated Goals  less pain, better sleep he wakes up 3-4 times a night.     Currently in Pain?  Yes    Pain Score  4  can get to a 9/10     Pain Location  Ankle    Pain Orientation  Left;Lateral    Pain Descriptors / Indicators  Aching;Sharp    Pain Type  Chronic pain    Pain Radiating Towards  to little toe     Pain Onset  More than a month ago    Pain Frequency  Constant    Aggravating Factors   activity     Pain Relieving Factors  ice     Effect of Pain on Daily Activities  activity          [facility] PT Assessment - 12/01/16 0001      Assessment   Medical Diagnosis  Lt ankle sprain    Referring Provider  Victorino December    Onset Date/Surgical Date   04/16/16    Next MD Visit  none scheduled    Prior Therapy  none      Precautions   Precautions  None      Restrictions   Weight Bearing Restrictions  No      Balance Screen   Has the patient fallen in the past 6 months  No    Has the patient had a decrease in activity level because of a fear of falling?   No    Is the patient reluctant to leave their home because of a fear of falling?   No      Prior Function   Level of Independence  Independent    Vocation  Retired    Leisure  walk, [employer], white water rafting, water tubing       Cognition   Overall Cognitive Status  Within Functional Limits for tasks assessed      Observation/Other Assessments   Focus on Therapeutic Outcomes (FOTO)   42      Observation/Other Assessments-Edema    Edema  Figure 8      Figure 8 Edema   Figure 8 - Right   54.4    Figure 8 - Left   54.8 Pt states ankle swells more towards the end of the day.      Functional Tests   Functional tests  Single leg stance      Single Leg Stance   Comments  RT:  20 seconds; Lt 5 seconds       ROM / Strength   AROM / PROM / Strength  AROM;Strength      AROM   AROM Assessment Site  Ankle    Right Knee Extension  11    Left Knee Extension  13    Right/Left Ankle  Right;Left    Right Ankle Dorsiflexion  -15    Right Ankle Plantar Flexion  70    Right Ankle Inversion  22    Right Ankle Eversion  20    Left Ankle Dorsiflexion  -30    Left Ankle Plantar Flexion  60    Left Ankle Inversion  15    Left Ankle Eversion  16      Strength   Strength Assessment Site  Ankle    Right/Left Ankle  Left    Left Ankle Dorsiflexion  2+/5    Left Ankle Plantar Flexion  4/5    Left Ankle Inversion  4/5    Left Ankle Eversion  3+/5             Objective measurements completed on examination: See above findings.      [facility] Adult PT Treatment/Exercise - 12/01/16 0001      Exercises   Exercises  Ankle      Ankle Exercises: Seated   ABC's  1 rep     Heel Raises  10 reps    Toe Raise  10 reps      Ankle Exercises: Supine   Other Supine Ankle Exercises  ROM x 10             PT Education - 12/01/16 1111    Education provided  Yes    Education Details  HEp    Person(s) Educated  Patient    Methods  Explanation;Handout    Comprehension  Returned demonstration       PT Short Term Goals - 12/01/16 1208      PT SHORT TERM GOAL #1   Title  Pt Lt ankle dorsiflexion to increase by 10 degrees to allow a more normalized heel toe gait pattern     Time  3    Period  Weeks    Status  New    Target Date  12/22/16      PT SHORT TERM GOAL #2   Title  Pt Lt ankle pain to be no greater than a 5/10 to allow pt to only be waking up 2x a night due to lt ankle pain     Time  3    Period  Weeks      PT SHORT TERM GOAL #3   Title  Pt to be able to single leg stance for 30 seconds on both LE to reduce risk of falling     Time  3    Period  Weeks    Status  New        PT Long Term Goals - 12/01/16 1211      PT LONG TERM GOAL #1   Title  Pt Lt dorsiflexion to be increased by 20 degrees to allow a more normalized gait pattern to decrease joint stress.     Time  6    Period  Weeks    Status  New    Target Date  01/12/17      PT LONG TERM GOAL #2   Title  Pt left ankle pain to be no greater than a 2/10 to allow pt to sleep throughout the night.     Time  6    Period  Weeks    Status  New      PT LONG TERM GOAL #3   Title  Pt to be able to single leg stance for 45 seconds on both LE to allow pt to walk on uneven terrain in comfort.     Time  6    Period  Weeks    Status  New             Plan - 12/01/16 1113    Clinical Impression Statement  Mr. Poch injured his LT ankle in April of this year when  he stepped into a hole.  He has had constant pain with it ever since, therefore he has been referred to skilled physical therapy.  Examination demonstrates mild swelling, decreased ROM, decreased strength, decreased  proprioception, difficulty walking  and increased pain     History and Personal Factors relevant to plan of care:  24 surgeries on his left knee, Rt TKR, spinal stenosis, CAD,     Clinical Presentation  Stable    Clinical Decision Making  Low    Rehab Potential  Good    PT Frequency  2x / week    PT Duration  6 weeks    PT Treatment/Interventions  Patient/family education;Therapeutic exercise;Therapeutic activities;Balance training;Passive range of motion;Manual lymph drainage;Gait training;Stair training;Cryotherapy Laser    PT Next Visit Plan  Begin rocker board, Baps, gastroc stretch, slant board stretch, heel and toe raises.  Progress with strengthening and balance as ROM improves     PT Home Exercise Plan  EVAL;  ROM, [place], sitting heel/toe raises        Patient will benefit from skilled therapeutic intervention in order to improve the following deficits and impairments:  Abnormal gait, Decreased activity tolerance, Decreased balance, Decreased strength, Decreased range of motion, Difficulty walking, Increased edema, Pain, Postural dysfunction  Visit Diagnosis: Stiffness of left ankle, not elsewhere classified  Pain in left ankle and joints of left foot  Difficulty in walking, not elsewhere classified  G-Codes - 12/17/2016 1214    Functional Assessment Tool Used (Outpatient Only)  foto    Functional Limitation  Mobility: Walking and moving around    Mobility: Walking and Moving Around Current Status [phone])  At least 40 percent but less than 60 percent impaired, limited or restricted    Mobility: Walking and Moving Around Goal Status [phone])  At least 20 percent but less than 40 percent impaired, limited or restricted        Problem List Patient Active Problem List   Diagnosis Date Noted  . Right patellar tendon rupture 12/04/2014  . Hyperlipidemia 11/25/2014  . Other specified hypothyroidism 11/25/2014  . S/P right TKA 03/23/2014  . S/P knee replacement 03/23/2014  .  Spinal stenosis of cervicothoracic region 10/08/2013  . RLS (restless legs syndrome) 10/08/2013  . DM type 2 causing vascular disease ([place]) 10/08/2013  . Preoperative cardiovascular examination 08/14/2013  . Sleep apnea with use of continuous positive airway pressure (CPAP) 04/02/2013  . 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/02/2013  . Diabetic neuropathy ([place]) 07/23/2012  . Obesity, morbid ([place]) 05/01/2012  . Hypersomnia, persistent 05/01/2012  . OSA on CPAP 04/19/2012  . Essential hypertension, benign 12/16/2009  . CORONARY ATHEROSCLEROSIS NATIVE CORONARY ARTERY 12/16/2009   Rayetta Humphrey, PT CLT [phone] 17-Dec-2016, 12:15 PM  [place] [address] [place], [place], [address] Phone: [phone]   Fax:  [phone]  Name: NATHON STEFANSKI MRN: [id number] Date of Birth: Apr 15, 1950

## 2016-12-01 NOTE — Patient Instructions (Addendum)
ROM: Plantar / Dorsiflexion    With left leg relaxed, gently flex and extend ankle. Move through full range of motion. Avoid pain. Repeat _10___ times per set. Do 1____ sets per session. Do _2___ sessions per day.  http://orth.exer.us/34   Copyright  VHI. All rights reserved.  ROM: Inversion / Eversion    With left leg relaxed, gently turn ankle and foot in and out. Move through full range of motion. Avoid pain. Repeat __10__ times per set. Do __1__ sets per session. Do __2__ sessions per day.  http://orth.exer.us/36   Copyright  VHI. All rights reserved.  Ankle Alphabet    Using left ankle and foot only, trace the letters of the alphabet. Perform A to Z. Repeat __1__ times per set. Do __1__ sets per session. Do _1___ sessions per day.  http://orth.exer.us/16   Copyright  VHI. All rights reserved.  Toe Raise (Sitting)    Raise toes, keeping heels on floor.   No raise your hees.   Repeat __10__ times per set. Do __1__ sets per session. Do __2__ sessions per day.  http://orth.exer.us/46   Copyright  VHI. All rights reserved.

## 2016-12-04 ENCOUNTER — Telehealth (HOSPITAL_COMMUNITY): Payer: Self-pay | Admitting: Family Medicine

## 2016-12-04 ENCOUNTER — Ambulatory Visit (HOSPITAL_COMMUNITY): Payer: Medicare Other | Admitting: Physical Therapy

## 2016-12-04 NOTE — Telephone Encounter (Signed)
12/04/16  patient left a message to cx said he woke up not feeling well today

## 2016-12-07 ENCOUNTER — Encounter (HOSPITAL_COMMUNITY): Payer: Self-pay | Admitting: Physical Therapy

## 2016-12-07 ENCOUNTER — Ambulatory Visit (HOSPITAL_COMMUNITY): Payer: Medicare Other | Attending: Orthopedic Surgery | Admitting: Physical Therapy

## 2016-12-07 DIAGNOSIS — R262 Difficulty in walking, not elsewhere classified: Secondary | ICD-10-CM | POA: Insufficient documentation

## 2016-12-07 DIAGNOSIS — M25672 Stiffness of left ankle, not elsewhere classified: Secondary | ICD-10-CM | POA: Diagnosis not present

## 2016-12-07 DIAGNOSIS — M25572 Pain in left ankle and joints of left foot: Secondary | ICD-10-CM | POA: Insufficient documentation

## 2016-12-07 NOTE — Patient Instructions (Addendum)
Heel Raise: Bilateral (Standing)    Rise on balls of feet. Repeat _10-15___ times per set. Do __1__ sets per session. Do ____2 sessions per day.  http://orth.exer.us/38   Copyright  VHI. All rights reserved.  Toe Raise (Standing)    Rock back on heels. Repeat _10-15___ times per set. Do ___1_ sets per session. Do ___2_ sessions per day.  http://orth.exer.us/42   Copyright  VHI. All rights reserved.  Balance: Unilateral    Attempt to balance on left leg, eyes open. Hold _30__ seconds. Repeat __3__ times per set. Do _1___ sets per session. Do _2___ sessions per day. Perform exercise with eyes closed.  http://orth.exer.us/28   Copyright  VHI. All rights reserved.  Gastroc Stretch    Stand with left foot back, leg straight, forward leg bent. Keeping heel on floor, turned slightly out, lean into wall until stretch is felt in calf. Hold __30__ seconds. Repeat 10____ times per set. Do ___1_ sets per session. Do _2___ sessions per day.  http://orth.exer.us/26   Copyright  VHI. All rights reserved.

## 2016-12-07 NOTE — Therapy (Signed)
[place], [place], [address] Phone: [phone]   Fax:  [phone]  Physical Therapy Treatment  Patient Details  Name: Paul Baker MRN: [id number] Date of Birth: Aug 01, 1950 Referring Provider: Victorino December   Encounter Date: 12/07/2016  PT End of Session - 12/07/16 1151    Visit Number  2    Number of Visits  12    Date for PT Re-Evaluation  12/31/16    Authorization Type  medicare    Authorization - Visit Number  2    Authorization - Number of Visits  10    PT Start Time  1117    PT Stop Time  1155    PT Time Calculation (min)  38 min    Activity Tolerance  Patient tolerated treatment well    Behavior During Therapy  [facility] for tasks assessed/performed       Past Medical History:  Diagnosis Date  . Arthritis   . Asthma   . Colon polyp   . Coronary atherosclerosis of native coronary artery    Diseased nondominant RCA  . DVT (deep venous thrombosis) ([place]) 2005   Right arm  . Essential hypertension, benign   . Headache   . History of transfusion   . Hypersomnia    CPAP of 16 cm, diagnosed with AHI of 60 in 2012,epworth 21- narcolepsy?  [name] Hypothyroidism   . Mixed hyperlipidemia   . Morbid obesity ([place])   . MRSA (methicillin resistant staph aureus) culture positive    08/2012  . Narcolepsy   . OSA (obstructive sleep apnea)   . Pneumonia   . Psoriasis   . Pulmonary embolism ([place]) 2004  . RLS (restless legs syndrome)   . Rotator cuff disorder    Left  . Septic arthritis of knee, left ([place])   . Skin cancer, basal cell   . Type 2 diabetes mellitus ([place])     Past Surgical History:  Procedure Laterality Date  . BACK SURGERY    . CATARACT EXTRACTION Bilateral   . COLONOSCOPY    . EYE SURGERY Left 2016   laser to left eye  . HIP SURGERY     bone removed from both sides of hip  . KNEE ARTHROTOMY Right 12/04/2014   Procedure: KNEE ARTHROTOMY PATELLA LIGAMENT RECONSTRUSION AND REPAIR RIGHT  KNEE;  Surgeon: Paralee Cancel, MD;  Location: [facility];  Service: Orthopedics;  Laterality: Right;  . KNEE SURGERY     X 25 TIMES  . LUMBAR DISC SURGERY     Left L3, L4, L5 discecotomy with decompression of L4 root  . TONSILLECTOMY    . TOTAL KNEE ARTHROPLASTY  2003   LEFT  . TOTAL KNEE ARTHROPLASTY Right 03/23/2014   Procedure: RIGHT TOTAL KNEE ARTHROPLASTY AND REMOVAL RIGHT TIBIAL  DEEP IMPLANT STAPLE;  Surgeon: Paralee Cancel, MD;  Location: [facility];  Service: Orthopedics;  Laterality: Right;  . TOTAL KNEE REVISION  2005   LEFT  . WRIST SURGERY      There were no vitals filed for this visit.  Subjective Assessment - 12/07/16 1113    Subjective  Pt states that he has been doing his exercises and feels like his ankle is getting better.      Pertinent History  DM, Rt TKR, Spinal stenosis, CAD, rotator cuff disorder.     Limitations  Standing;Walking;House hold activities    How long can you stand comfortably?  immediately has increased pain  How long can you walk comfortably?  Pt has increased pain immediately upon weight bearing but will continue to do what he needs to get done.     Patient Stated Goals  less pain, better sleep he wakes up 3-4 times a night.     Currently in Pain?  Yes    Pain Score  4     Pain Location  Ankle    Pain Orientation  Left    Pain Descriptors / Indicators  Aching    Pain Onset  More than a month ago    Aggravating Factors   activity     Pain Relieving Factors  ice                       [facility] Adult PT Treatment/Exercise - 12/07/16 0001      Exercises   Exercises  Ankle      Knee/Hip Exercises: Stretches   Gastroc Stretch  Left;3 reps;30 seconds  as well as slant board x 30       Knee/Hip Exercises: Standing   Heel Raises  Both;10 reps toe raises x 10     SLS  x5      Ankle Exercises: Standing   BAPS  Standing;Level 2;10 reps    SLS  x3 B    Rocker Board  2 minutes    Rocker Board Limitations  RT/Lt; and Ant/post     Other  Standing Ankle Exercises  tandem stance x 3      Ankle Exercises: Seated   Other Seated Ankle Exercises  resisted DFand inversionn 3#               PT Short Term Goals - 12/01/16 1208      PT SHORT TERM GOAL #1   Title  Pt Lt ankle dorsiflexion to increase by 10 degrees to allow a more normalized heel toe gait pattern     Time  3    Period  Weeks    Status  New    Target Date  12/22/16      PT SHORT TERM GOAL #2   Title  Pt Lt ankle pain to be no greater than a 5/10 to allow pt to only be waking up 2x a night due to lt ankle pain     Time  3    Period  Weeks      PT SHORT TERM GOAL #3   Title  Pt to be able to single leg stance for 30 seconds on both LE to reduce risk of falling     Time  3    Period  Weeks    Status  New        PT Long Term Goals - 12/01/16 1211      PT LONG TERM GOAL #1   Title  Pt Lt dorsiflexion to be increased by 20 degrees to allow a more normalized gait pattern to decrease joint stress.     Time  6    Period  Weeks    Status  New    Target Date  01/12/17      PT LONG TERM GOAL #2   Title  Pt left ankle pain to be no greater than a 2/10 to allow pt to sleep throughout the night.     Time  6    Period  Weeks    Status  New      PT LONG TERM GOAL #3  Title  Pt to be able to single leg stance for 45 seconds on both LE to allow pt to walk on uneven terrain in comfort.     Time  6    Period  Weeks    Status  New            Plan - 12/07/16 1156    Clinical Impression Statement  Reviewed evaluation and goals with patient.  Began weightbearing exercises and updated HEP. Pt needs minimal to moderate verbal cuing for proper execution of exercises.     Clinical Presentation  Stable    Rehab Potential  Good    PT Frequency  2x / week    PT Duration  6 weeks    PT Treatment/Interventions  Patient/family education;Therapeutic exercise;Therapeutic activities;Balance training;Passive range of motion;Manual lymph drainage;Gait  training;Stair training;Cryotherapy Laser    PT Next Visit Plan    Progress with strengthening and balance     PT Home Exercise Plan  EVAL;  ROM, Alphebet, sitting heel/toe raises        Patient will benefit from skilled therapeutic intervention in order to improve the following deficits and impairments:  Abnormal gait, Decreased activity tolerance, Decreased balance, Decreased strength, Decreased range of motion, Difficulty walking, Increased edema, Pain, Postural dysfunction  Visit Diagnosis: Stiffness of left ankle, not elsewhere classified  Pain in left ankle and joints of left foot  Difficulty in walking, not elsewhere classified     Problem List Patient Active Problem List   Diagnosis Date Noted  . Right patellar tendon rupture 12/04/2014  . Hyperlipidemia 11/25/2014  . Other specified hypothyroidism 11/25/2014  . S/P right TKA 03/23/2014  . S/P knee replacement 03/23/2014  . Spinal stenosis of cervicothoracic region 10/08/2013  . RLS (restless legs syndrome) 10/08/2013  . DM type 2 causing vascular disease ([place]) 10/08/2013  . Preoperative cardiovascular examination 08/14/2013  . Sleep apnea with use of continuous positive airway pressure (CPAP) 04/02/2013  . 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/02/2013  . Diabetic neuropathy ([place]) 07/23/2012  . Obesity, morbid ([place]) 05/01/2012  . Hypersomnia, persistent 05/01/2012  . OSA on CPAP 04/19/2012  . Essential hypertension, benign 12/16/2009  . CORONARY ATHEROSCLEROSIS NATIVE CORONARY ARTERY 12/16/2009    Rayetta Humphrey, PT CLT [phone] 12/07/2016, 11:58 AM  [facility] [address] [place], [place], [address] Phone: [phone]   Fax:  [phone]  Name: KRISTAIN HU MRN: [id number] Date of Birth: 29-Dec-1950

## 2016-12-12 ENCOUNTER — Ambulatory Visit (HOSPITAL_COMMUNITY): Payer: Medicare Other | Admitting: Physical Therapy

## 2016-12-12 ENCOUNTER — Telehealth (HOSPITAL_COMMUNITY): Payer: Self-pay | Admitting: Family Medicine

## 2016-12-12 NOTE — Telephone Encounter (Signed)
12/12/16  i offered to reschedule for Wed but he said no he has so much to dig out

## 2016-12-14 DIAGNOSIS — E291 Testicular hypofunction: Secondary | ICD-10-CM | POA: Diagnosis not present

## 2016-12-15 ENCOUNTER — Encounter (HOSPITAL_COMMUNITY): Payer: Self-pay

## 2016-12-15 ENCOUNTER — Ambulatory Visit (HOSPITAL_COMMUNITY): Payer: Medicare Other

## 2016-12-15 DIAGNOSIS — M25572 Pain in left ankle and joints of left foot: Secondary | ICD-10-CM | POA: Diagnosis not present

## 2016-12-15 DIAGNOSIS — R262 Difficulty in walking, not elsewhere classified: Secondary | ICD-10-CM

## 2016-12-15 DIAGNOSIS — M25672 Stiffness of left ankle, not elsewhere classified: Secondary | ICD-10-CM | POA: Diagnosis not present

## 2016-12-15 NOTE — Therapy (Signed)
[place], [place], [address] Phone: [phone]   Fax:  [phone]  Physical Therapy Treatment  Patient Details  Name: Paul Baker MRN: [id number] Date of Birth: [birth year] Referring Provider: Victorino December   Encounter Date: 12/15/2016  PT End of Session - 12/15/16 1127    Visit Number  3    Number of Visits  12    Date for PT Re-Evaluation  12/31/16    Authorization Type  medicare    Authorization - Visit Number  3    Authorization - Number of Visits  10    PT Start Time  1122    PT Stop Time  1202    PT Time Calculation (min)  40 min    Activity Tolerance  Patient tolerated treatment well    Behavior During Therapy  [facility] for tasks assessed/performed       Past Medical History:  Diagnosis Date  . Arthritis   . Asthma   . Colon polyp   . Coronary atherosclerosis of native coronary artery    Diseased nondominant RCA  . DVT (deep venous thrombosis) ([place]) 2005   Right arm  . Essential hypertension, benign   . Headache   . History of transfusion   . Hypersomnia    CPAP of 16 cm, diagnosed with AHI of 60 in 2012,epworth 21- narcolepsy?  [name] Hypothyroidism   . Mixed hyperlipidemia   . Morbid obesity ([place])   . MRSA (methicillin resistant staph aureus) culture positive    08/2012  . Narcolepsy   . OSA (obstructive sleep apnea)   . Pneumonia   . Psoriasis   . Pulmonary embolism ([place]) 2004  . RLS (restless legs syndrome)   . Rotator cuff disorder    Left  . Septic arthritis of knee, left ([place])   . Skin cancer, basal cell   . Type 2 diabetes mellitus ([place])     Past Surgical History:  Procedure Laterality Date  . BACK SURGERY    . CATARACT EXTRACTION Bilateral   . COLONOSCOPY    . EYE SURGERY Left 2016   laser to left eye  . HIP SURGERY     bone removed from both sides of hip  . KNEE ARTHROTOMY Right 12/04/2014   Procedure: KNEE ARTHROTOMY PATELLA LIGAMENT RECONSTRUSION AND REPAIR RIGHT  KNEE;  Surgeon: Paralee Cancel, MD;  Location: [facility];  Service: Orthopedics;  Laterality: Right;  . KNEE SURGERY     X 25 TIMES  . LUMBAR DISC SURGERY     Left L3, L4, L5 discecotomy with decompression of L4 root  . TONSILLECTOMY    . TOTAL KNEE ARTHROPLASTY  2003   LEFT  . TOTAL KNEE ARTHROPLASTY Right 03/23/2014   Procedure: RIGHT TOTAL KNEE ARTHROPLASTY AND REMOVAL RIGHT TIBIAL  DEEP IMPLANT STAPLE;  Surgeon: Paralee Cancel, MD;  [facility];  Service: Orthopedics;  Laterality: Right;  . TOTAL KNEE REVISION  2005   LEFT  . WRIST SURGERY      There were no vitals filed for this visit.  Subjective Assessment - 12/15/16 1124    Subjective  Pt stated ankle is getting better, continues to be compliant with HEP without questions.  Reports one fall a week ago, believes the board he was standing on was weak, stated he hit his head and was able to get up independently.    Pertinent History  DM, Rt TKR, Spinal stenosis, CAD, rotator cuff disorder.  Patient Stated Goals  less pain, better sleep he wakes up 3-4 times a night.     Currently in Pain?  Yes    Pain Score  3     Pain Location  Ankle    Pain Orientation  Left    Pain Descriptors / Indicators  Sore;Tender    Pain Type  Chronic pain    Pain Onset  More than a month ago    Pain Frequency  Constant    Aggravating Factors   activity    Pain Relieving Factors  ice    Effect of Pain on Daily Activities  activity                      [facility] Adult PT Treatment/Exercise - 12/15/16 0001      Ankle Exercises: Standing   BAPS  Standing;Level 2;10 reps all directions    SLS  x3 B Rt 42" and Lt 37" max of 3    Rocker Board  2 minutes    Rocker Board Limitations  RT/Lt; and Ant/post     Heel Raises  15 reps    Toe Raise  15 reps    Other Standing Ankle Exercises  tandem stance x 3x30" on foam      Ankle Exercises: Seated   Other Seated Ankle Exercises  resisted DFand inversionn 4#      Ankle Exercises: Stretches    Slant Board Stretch  3 reps;30 seconds               PT Short Term Goals - 12/01/16 1208      PT SHORT TERM GOAL #1   Title  Pt Lt ankle dorsiflexion to increase by 10 degrees to allow a more normalized heel toe gait pattern     Time  3    Period  Weeks    Status  New    Target Date  12/22/16      PT SHORT TERM GOAL #2   Title  Pt Lt ankle pain to be no greater than a 5/10 to allow pt to only be waking up 2x a night due to lt ankle pain     Time  3    Period  Weeks      PT SHORT TERM GOAL #3   Title  Pt to be able to single leg stance for 30 seconds on both LE to reduce risk of falling     Time  3    Period  Weeks    Status  New        PT Long Term Goals - 12/01/16 1211      PT LONG TERM GOAL #1   Title  Pt Lt dorsiflexion to be increased by 20 degrees to allow a more normalized gait pattern to decrease joint stress.     Time  6    Period  Weeks    Status  New    Target Date  01/12/17      PT LONG TERM GOAL #2   Title  Pt left ankle pain to be no greater than a 2/10 to allow pt to sleep throughout the night.     Time  6    Period  Weeks    Status  New      PT LONG TERM GOAL #3   Title  Pt to be able to single leg stance for 45 seconds on both LE to allow pt to walk on uneven  terrain in comfort.     Time  6    Period  Weeks    Status  New            Plan - 12/15/16 1147    Clinical Impression Statement  Progressed to dynamic surface with balance activities for balance and proprioception strengthening.  Minimal cueing for proper for with therex.  Able to increase some reps for functional strengthening with visible fatigue.  No reports of increased pain through session.      Rehab Potential  Good    PT Frequency  2x / week    PT Duration  6 weeks    PT Treatment/Interventions  Patient/family education;Therapeutic exercise;Therapeutic activities;Balance training;Passive range of motion;Manual lymph drainage;Gait training;Stair training;Cryotherapy     PT Next Visit Plan    Progress with strengthening and balance.  Add vector stance next session.    PT Home Exercise Plan  EVAL;  ROM, Alphebet, sitting heel/toe raises        Patient will benefit from skilled therapeutic intervention in order to improve the following deficits and impairments:  Abnormal gait, Decreased activity tolerance, Decreased balance, Decreased strength, Decreased range of motion, Difficulty walking, Increased edema, Pain, Postural dysfunction  Visit Diagnosis: Stiffness of left ankle, not elsewhere classified  Pain in left ankle and joints of left foot  Difficulty in walking, not elsewhere classified     Problem List Patient Active Problem List   Diagnosis Date Noted  . Right patellar tendon rupture 12/04/2014  . Hyperlipidemia 11/25/2014  . Other specified hypothyroidism 11/25/2014  . S/P right TKA 03/23/2014  . S/P knee replacement 03/23/2014  . Spinal stenosis of cervicothoracic region 10/08/2013  . RLS (restless legs syndrome) 10/08/2013  . DM type 2 causing vascular disease ([place]) 10/08/2013  . Preoperative cardiovascular examination 08/14/2013  . Sleep apnea with use of continuous positive airway pressure (CPAP) 04/02/2013  . 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/02/2013  . Diabetic neuropathy ([place]) 07/23/2012  . Obesity, morbid ([place]) 05/01/2012  . Hypersomnia, persistent 05/01/2012  . OSA on CPAP 04/19/2012  . Essential hypertension, benign 12/16/2009  . CORONARY ATHEROSCLEROSIS NATIVE CORONARY ARTERY 12/16/2009   Ihor Austin, LPTA; [place]  Aldona Lento 12/15/2016, 11:58 AM  [place], [place], [address] Phone: [phone]   Fax:  [phone]  Name: Paul Baker MRN: [id number] Date of Birth: Jun 23, 1950

## 2016-12-19 ENCOUNTER — Ambulatory Visit (HOSPITAL_COMMUNITY): Payer: Medicare Other | Admitting: Physical Therapy

## 2016-12-19 DIAGNOSIS — M25572 Pain in left ankle and joints of left foot: Secondary | ICD-10-CM

## 2016-12-19 DIAGNOSIS — M25672 Stiffness of left ankle, not elsewhere classified: Secondary | ICD-10-CM

## 2016-12-19 DIAGNOSIS — R262 Difficulty in walking, not elsewhere classified: Secondary | ICD-10-CM

## 2016-12-19 NOTE — Therapy (Signed)
[place], [place], [address] Phone: [phone]   Fax:  [phone]  Physical Therapy Treatment  Patient Details  Name: Paul Baker MRN: [id number] Date of Birth: 1950/03/16 Referring Provider: Victorino December   Encounter Date: 12/19/2016  PT End of Session - 12/19/16 1204    Visit Number  4    Number of Visits  12    Date for PT Re-Evaluation  12/31/16    Authorization Type  medicare    Authorization - Visit Number  4    Authorization - Number of Visits  10    PT Start Time  2952    PT Stop Time  1200    PT Time Calculation (min)  42 min    Activity Tolerance  Patient tolerated treatment well    Behavior During Therapy  [facility] for tasks assessed/performed       Past Medical History:  Diagnosis Date  . Arthritis   . Asthma   . Colon polyp   . Coronary atherosclerosis of native coronary artery    Diseased nondominant RCA  . DVT (deep venous thrombosis) ([place]) 2005   Right arm  . Essential hypertension, benign   . Headache   . History of transfusion   . Hypersomnia    CPAP of 16 cm, diagnosed with AHI of 60 in 2012,epworth 21- narcolepsy?  [name] Hypothyroidism   . Mixed hyperlipidemia   . Morbid obesity ([place])   . MRSA (methicillin resistant staph aureus) culture positive    08/2012  . Narcolepsy   . OSA (obstructive sleep apnea)   . Pneumonia   . Psoriasis   . Pulmonary embolism ([place]) 2004  . RLS (restless legs syndrome)   . Rotator cuff disorder    Left  . Septic arthritis of knee, left ([place])   . Skin cancer, basal cell   . Type 2 diabetes mellitus ([place])     Past Surgical History:  Procedure Laterality Date  . BACK SURGERY    . CATARACT EXTRACTION Bilateral   . COLONOSCOPY    . EYE SURGERY Left 2016   laser to left eye  . HIP SURGERY     bone removed from both sides of hip  . KNEE ARTHROTOMY Right 12/04/2014   Procedure: KNEE ARTHROTOMY PATELLA LIGAMENT RECONSTRUSION AND REPAIR RIGHT  KNEE;  Surgeon: Paralee Cancel, MD;  Location: [facility];  Service: Orthopedics;  Laterality: Right;  . KNEE SURGERY     X 25 TIMES  . LUMBAR DISC SURGERY     Left L3, L4, L5 discecotomy with decompression of L4 root  . TONSILLECTOMY    . TOTAL KNEE ARTHROPLASTY  2003   LEFT  . TOTAL KNEE ARTHROPLASTY Right 03/23/2014   Procedure: RIGHT TOTAL KNEE ARTHROPLASTY AND REMOVAL RIGHT TIBIAL  DEEP IMPLANT STAPLE;  Surgeon: Paralee Cancel, MD;  [facility];  Service: Orthopedics;  Laterality: Right;  . TOTAL KNEE REVISION  2005   LEFT  . WRIST SURGERY      There were no vitals filed for this visit.  Subjective Assessment - 12/19/16 1128    Subjective  Pt states his pain is 4/10 currently in his Lt ankle.  Pt states he is doing his HEP.    Currently in Pain?  Yes    Pain Score  4     Pain Location  Ankle    Pain Orientation  Left  [facility] Adult PT Treatment/Exercise - 12/19/16 0001      Ankle Exercises: Stretches   Slant Board Stretch  3 reps;30 seconds      Ankle Exercises: Standing   BAPS  Standing;Level 2;10 reps    Vector Stance  Right;Left;Limitations    Vector Stance Limitations  10 reps 3" holds with 1 HHA    SLS  30" each    Rocker Board  2 minutes    Heel Raises  15 reps    Toe Raise  15 reps    Other Standing Ankle Exercises  tandem stance x 3x30" on foam    Other Standing Ankle Exercises  tandem gait, side stepping, retro gait 2RT             PT Education - 12/19/16 1203    Education provided  Yes    Education Details  instructed to continue HEP, specifically balance actvitiies.  Instructed with standing plantar/gastroc stretch on step to complete at home    Person(s) Educated  Patient    Methods  Explanation;Demonstration    Comprehension  Verbalized understanding;Returned demonstration       PT Short Term Goals - 12/01/16 1208      PT SHORT TERM GOAL #1   Title  Pt Lt ankle dorsiflexion to increase by 10 degrees to allow a  more normalized heel toe gait pattern     Time  3    Period  Weeks    Status  New    Target Date  12/22/16      PT SHORT TERM GOAL #2   Title  Pt Lt ankle pain to be no greater than a 5/10 to allow pt to only be waking up 2x a night due to lt ankle pain     Time  3    Period  Weeks      PT SHORT TERM GOAL #3   Title  Pt to be able to single leg stance for 30 seconds on both LE to reduce risk of falling     Time  3    Period  Weeks    Status  New        PT Long Term Goals - 12/01/16 1211      PT LONG TERM GOAL #1   Title  Pt Lt dorsiflexion to be increased by 20 degrees to allow a more normalized gait pattern to decrease joint stress.     Time  6    Period  Weeks    Status  New    Target Date  01/12/17      PT LONG TERM GOAL #2   Title  Pt left ankle pain to be no greater than a 2/10 to allow pt to sleep throughout the night.     Time  6    Period  Weeks    Status  New      PT LONG TERM GOAL #3   Title  Pt to be able to single leg stance for 45 seconds on both LE to allow pt to walk on uneven terrain in comfort.     Time  6    Period  Weeks    Status  New            Plan - 12/19/16 1204    Clinical Impression Statement  Continued with primary focus on strength and stability of Lt ankle.  Pt with cues for posture with all aspects of treatment.   Added vector stance  and progressed to dynamic stabiltiy including tandem gait, side stepping and retro gait.  pt with some instabilites completing all.  Pt wtihout increased c/o pain or issues at EOS.    Rehab Potential  Good    PT Frequency  2x / week    PT Duration  6 weeks    PT Treatment/Interventions  Patient/family education;Therapeutic exercise;Therapeutic activities;Balance training;Passive range of motion;Manual lymph drainage;Gait training;Stair training;Cryotherapy    PT Next Visit Plan    Progress with strengthening and balance.  Begin balance beam.    PT Home Exercise Plan  EVAL;  ROM, Alphebet, sitting  heel/toe raises        Patient will benefit from skilled therapeutic intervention in order to improve the following deficits and impairments:  Abnormal gait, Decreased activity tolerance, Decreased balance, Decreased strength, Decreased range of motion, Difficulty walking, Increased edema, Pain, Postural dysfunction  Visit Diagnosis: Stiffness of left ankle, not elsewhere classified  Pain in left ankle and joints of left foot  Difficulty in walking, not elsewhere classified     Problem List Patient Active Problem List   Diagnosis Date Noted  . Right patellar tendon rupture 12/04/2014  . Hyperlipidemia 11/25/2014  . Other specified hypothyroidism 11/25/2014  . S/P right TKA 03/23/2014  . S/P knee replacement 03/23/2014  . Spinal stenosis of cervicothoracic region 10/08/2013  . RLS (restless legs syndrome) 10/08/2013  . DM type 2 causing vascular disease ([place]) 10/08/2013  . Preoperative cardiovascular examination 08/14/2013  . Sleep apnea with use of continuous positive airway pressure (CPAP) 04/02/2013  . 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/02/2013  . Diabetic neuropathy ([place]) 07/23/2012  . Obesity, morbid ([place]) 05/01/2012  . Hypersomnia, persistent 05/01/2012  . OSA on CPAP 04/19/2012  . Essential hypertension, benign 12/16/2009  . CORONARY ATHEROSCLEROSIS NATIVE CORONARY ARTERY 12/16/2009   Teena Irani, PTA/CLT [phone]  Teena Irani 12/19/2016, 12:07 PM  [place], [place], [address] Phone: [phone]   Fax:  [phone]  Name: ORAZIO WELLER MRN: [id number] Date of Birth: 10-17-1950

## 2016-12-20 ENCOUNTER — Telehealth (HOSPITAL_COMMUNITY): Payer: Self-pay | Admitting: Family Medicine

## 2016-12-20 NOTE — Telephone Encounter (Signed)
12/20/16  pt called and said that he woulnd't be here on 12/20

## 2016-12-21 ENCOUNTER — Encounter (HOSPITAL_COMMUNITY): Payer: Medicare Other

## 2016-12-21 DIAGNOSIS — E1159 Type 2 diabetes mellitus with other circulatory complications: Secondary | ICD-10-CM | POA: Diagnosis not present

## 2016-12-21 DIAGNOSIS — E038 Other specified hypothyroidism: Secondary | ICD-10-CM | POA: Diagnosis not present

## 2016-12-22 DIAGNOSIS — I10 Essential (primary) hypertension: Secondary | ICD-10-CM | POA: Diagnosis not present

## 2016-12-22 DIAGNOSIS — Z6836 Body mass index (BMI) 36.0-36.9, adult: Secondary | ICD-10-CM | POA: Diagnosis not present

## 2016-12-22 DIAGNOSIS — M549 Dorsalgia, unspecified: Secondary | ICD-10-CM | POA: Diagnosis not present

## 2016-12-22 DIAGNOSIS — N189 Chronic kidney disease, unspecified: Secondary | ICD-10-CM | POA: Diagnosis not present

## 2016-12-22 DIAGNOSIS — R42 Dizziness and giddiness: Secondary | ICD-10-CM | POA: Diagnosis not present

## 2016-12-22 DIAGNOSIS — E119 Type 2 diabetes mellitus without complications: Secondary | ICD-10-CM | POA: Diagnosis not present

## 2016-12-22 LAB — COMPLETE METABOLIC PANEL WITH GFR
AG Ratio: 2 (calc) (ref 1.0–2.5)
ALT: 12 U/L (ref 9–46)
AST: 16 U/L (ref 10–35)
Albumin: 4.1 g/dL (ref 3.6–5.1)
Alkaline phosphatase (APISO): 33 U/L — ABNORMAL LOW (ref 40–115)
BUN/Creatinine Ratio: 18 (calc) (ref 6–22)
BUN: 25 mg/dL (ref 7–25)
CALCIUM: 9.8 mg/dL (ref 8.6–10.3)
CO2: 26 mmol/L (ref 20–32)
CREATININE: 1.38 mg/dL — AB (ref 0.70–1.25)
Chloride: 101 mmol/L (ref 98–110)
GFR, EST AFRICAN AMERICAN: 62 mL/min/{1.73_m2} (ref >=60)
GFR, EST NON AFRICAN AMERICAN: 53 mL/min/{1.73_m2} — AB (ref >=60)
Globulin: 2.1 g/dL (calc) (ref 1.9–3.7)
Glucose, Bld: 179 mg/dL — ABNORMAL HIGH (ref 65–99)
Potassium: 4.7 mmol/L (ref 3.5–5.3)
Sodium: 137 mmol/L (ref 135–146)
TOTAL PROTEIN: 6.2 g/dL (ref 6.1–8.1)
Total Bilirubin: 0.3 mg/dL (ref 0.2–1.2)

## 2016-12-22 LAB — HEMOGLOBIN A1C
EAG (MMOL/L): 11.9 (calc)
Hgb A1c MFr Bld: 9.1 % of total Hgb — ABNORMAL HIGH (ref <5.7)
MEAN PLASMA GLUCOSE: 214 (calc)

## 2016-12-22 LAB — T4, FREE: FREE T4: 1.3 ng/dL (ref 0.8–1.8)

## 2016-12-22 LAB — TSH: TSH: 1.24 mIU/L (ref 0.40–4.50)

## 2016-12-25 ENCOUNTER — Ambulatory Visit (HOSPITAL_COMMUNITY): Payer: Medicare Other

## 2016-12-25 ENCOUNTER — Other Ambulatory Visit: Payer: Self-pay

## 2016-12-25 ENCOUNTER — Encounter (HOSPITAL_COMMUNITY): Payer: Self-pay

## 2016-12-25 DIAGNOSIS — R262 Difficulty in walking, not elsewhere classified: Secondary | ICD-10-CM

## 2016-12-25 DIAGNOSIS — M25672 Stiffness of left ankle, not elsewhere classified: Secondary | ICD-10-CM

## 2016-12-25 DIAGNOSIS — M25572 Pain in left ankle and joints of left foot: Secondary | ICD-10-CM | POA: Diagnosis not present

## 2016-12-25 NOTE — Therapy (Signed)
[place], [place], [address] Phone: [phone]   Fax:  [phone]  Physical Therapy Treatment  Patient Details  Name: Paul Baker MRN: [id number] Date of Birth: [birth year] Referring Provider: Victorino December   Encounter Date: 12/25/2016  PT End of Session - 12/25/16 0946    Visit Number  5    Number of Visits  12    Date for PT Re-Evaluation  12/31/16    Authorization Type  medicare    Authorization - Visit Number  5    Authorization - Number of Visits  10    PT Start Time  0945    PT Stop Time  1026    PT Time Calculation (min)  41 min    Activity Tolerance  Patient tolerated treatment well    Behavior During Therapy  [facility] for tasks assessed/performed       Past Medical History:  Diagnosis Date  . Arthritis   . Asthma   . Colon polyp   . Coronary atherosclerosis of native coronary artery    Diseased nondominant RCA  . DVT (deep venous thrombosis) ([place]) 2005   Right arm  . Essential hypertension, benign   . Headache   . History of transfusion   . Hypersomnia    CPAP of 16 cm, diagnosed with AHI of 60 in 2012,epworth 21- narcolepsy?  [name] Hypothyroidism   . Mixed hyperlipidemia   . Morbid obesity ([place])   . MRSA (methicillin resistant staph aureus) culture positive    08/2012  . Narcolepsy   . OSA (obstructive sleep apnea)   . Pneumonia   . Psoriasis   . Pulmonary embolism ([place]) 2004  . RLS (restless legs syndrome)   . Rotator cuff disorder    Left  . Septic arthritis of knee, left ([place])   . Skin cancer, basal cell   . Type 2 diabetes mellitus ([place])     Past Surgical History:  Procedure Laterality Date  . BACK SURGERY    . CATARACT EXTRACTION Bilateral   . COLONOSCOPY    . EYE SURGERY Left 2016   laser to left eye  . HIP SURGERY     bone removed from both sides of hip  . KNEE ARTHROTOMY Right 12/04/2014   Procedure: KNEE ARTHROTOMY PATELLA LIGAMENT RECONSTRUSION AND REPAIR RIGHT  KNEE;  Surgeon: Paralee Cancel, MD;  Location: [facility];  Service: Orthopedics;  Laterality: Right;  . KNEE SURGERY     X 25 TIMES  . LUMBAR DISC SURGERY     Left L3, L4, L5 discecotomy with decompression of L4 root  . TONSILLECTOMY    . TOTAL KNEE ARTHROPLASTY  2003   LEFT  . TOTAL KNEE ARTHROPLASTY Right 03/23/2014   Procedure: RIGHT TOTAL KNEE ARTHROPLASTY AND REMOVAL RIGHT TIBIAL  DEEP IMPLANT STAPLE;  Surgeon: Paralee Cancel, MD;  [facility];  Service: Orthopedics;  Laterality: Right;  . TOTAL KNEE REVISION  2005   LEFT  . WRIST SURGERY      There were no vitals filed for this visit.  Subjective Assessment - 12/25/16 0946    Subjective  Pt states that he stepped off of a curb wrong yesterday and has been having increased L ankle pain ever since.     Currently in Pain?  Yes    Pain Score  5     Pain Location  Ankle    Pain Orientation  Left    Pain Descriptors / Indicators  Sore    Pain Type  Chronic pain    Pain Onset  More than a month ago    Pain Frequency  Constant    Aggravating Factors   activity    Pain Relieving Factors  ice    Effect of Pain on Daily Activities  activity            [facility] Adult PT Treatment/Exercise - 12/25/16 0001      Manual Therapy   Manual Therapy  Joint mobilization;Soft tissue mobilization    Manual therapy comments  completed separate rest of treatment    Joint Mobilization  Grade I-II AP talocrural joint mobs for pain control     Soft tissue mobilization  STM to anterior and lateral ankle for pain management      Ankle Exercises: Stretches   Slant Board Stretch  3 reps;30 seconds      Ankle Exercises: Standing   SLS  LLE only on foam with 1UE support x2 mins total; SLS on LLE only + vectors x5RT    Other Standing Ankle Exercises  tandem stance on foam + UE flexion with 2# weight bar 2x10 reps each; sidestepping on foam x3RT            PT Education - 12/25/16 1026    Education provided  Yes    Education Details  may be  tender from STM/joint mobs but this will improve    Person(s) Educated  Patient    Methods  Explanation    Comprehension  Verbalized understanding       PT Short Term Goals - 12/01/16 1208      PT SHORT TERM GOAL #1   Title  Pt Lt ankle dorsiflexion to increase by 10 degrees to allow a more normalized heel toe gait pattern     Time  3    Period  Weeks    Status  New    Target Date  12/22/16      PT SHORT TERM GOAL #2   Title  Pt Lt ankle pain to be no greater than a 5/10 to allow pt to only be waking up 2x a night due to lt ankle pain     Time  3    Period  Weeks      PT SHORT TERM GOAL #3   Title  Pt to be able to single leg stance for 30 seconds on both LE to reduce risk of falling     Time  3    Period  Weeks    Status  New        PT Long Term Goals - 12/01/16 1211      PT LONG TERM GOAL #1   Title  Pt Lt dorsiflexion to be increased by 20 degrees to allow a more normalized gait pattern to decrease joint stress.     Time  6    Period  Weeks    Status  New    Target Date  01/12/17      PT LONG TERM GOAL #2   Title  Pt left ankle pain to be no greater than a 2/10 to allow pt to sleep throughout the night.     Time  6    Period  Weeks    Status  New      PT LONG TERM GOAL #3   Title  Pt to be able to single leg stance for 45 seconds on both LE to allow pt to walk  on uneven terrain in comfort.     Time  6    Period  Weeks    Status  New            Plan - 12/25/16 1026    Clinical Impression Statement  Pt presents to therapy with reports of increased L ankle pain after stepping off of a curb yesterday. Performed manual joint mobs and STM for pain control. Pt reported much improved symptoms following. Rest of session focused on dynamic stability of L ankle. Pt reported 2/10 pain at EOS compared to 5/10 at beginning of session.    Rehab Potential  Good    PT Frequency  2x / week    PT Duration  6 weeks    PT Treatment/Interventions  Patient/family  education;Therapeutic exercise;Therapeutic activities;Balance training;Passive range of motion;Manual lymph drainage;Gait training;Stair training;Cryotherapy    PT Next Visit Plan    Progress with strengthening and balance.  continue balance beam. Continue manual for ROM and pain mangement    PT Home Exercise Plan  EVAL;  ROM, Alphebet, sitting heel/toe raises     Consulted and Agree with Plan of Care  Patient       Patient will benefit from skilled therapeutic intervention in order to improve the following deficits and impairments:  Abnormal gait, Decreased activity tolerance, Decreased balance, Decreased strength, Decreased range of motion, Difficulty walking, Increased edema, Pain, Postural dysfunction  Visit Diagnosis: Stiffness of left ankle, not elsewhere classified  Pain in left ankle and joints of left foot  Difficulty in walking, not elsewhere classified     Problem List Patient Active Problem List   Diagnosis Date Noted  . Right patellar tendon rupture 12/04/2014  . Hyperlipidemia 11/25/2014  . Other specified hypothyroidism 11/25/2014  . S/P right TKA 03/23/2014  . S/P knee replacement 03/23/2014  . Spinal stenosis of cervicothoracic region 10/08/2013  . RLS (restless legs syndrome) 10/08/2013  . DM type 2 causing vascular disease ([place]) 10/08/2013  . Preoperative cardiovascular examination 08/14/2013  . Sleep apnea with use of continuous positive airway pressure (CPAP) 04/02/2013  . 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/02/2013  . Diabetic neuropathy ([place]) 07/23/2012  . Obesity, morbid ([place]) 05/01/2012  . Hypersomnia, persistent 05/01/2012  . OSA on CPAP 04/19/2012  . Essential hypertension, benign 12/16/2009  . CORONARY ATHEROSCLEROSIS NATIVE CORONARY ARTERY 12/16/2009      Geraldine Solar PT, DPT  [facility] [address] [place], [place], [address] Phone: [phone]   Fax:  [phone]  Name: Paul Baker MRN: [id number] Date of Birth: 05/03/1950

## 2016-12-28 ENCOUNTER — Encounter (HOSPITAL_COMMUNITY): Payer: Self-pay | Admitting: Physical Therapy

## 2016-12-28 ENCOUNTER — Ambulatory Visit: Payer: Medicare Other | Admitting: "Endocrinology

## 2016-12-28 ENCOUNTER — Ambulatory Visit (HOSPITAL_COMMUNITY): Payer: Medicare Other | Admitting: Physical Therapy

## 2016-12-28 DIAGNOSIS — M25572 Pain in left ankle and joints of left foot: Secondary | ICD-10-CM

## 2016-12-28 DIAGNOSIS — M25672 Stiffness of left ankle, not elsewhere classified: Secondary | ICD-10-CM

## 2016-12-28 DIAGNOSIS — R262 Difficulty in walking, not elsewhere classified: Secondary | ICD-10-CM | POA: Diagnosis not present

## 2016-12-28 NOTE — Therapy (Signed)
[place], [place], [address] Phone: [phone]   Fax:  [phone]  Physical Therapy Treatment  Patient Details  Name: Paul Baker MRN: [id number] Date of Birth: 1950-01-07 Referring Provider: Victorino December   Encounter Date: 12/28/2016  PT End of Session - 12/28/16 1117    Visit Number  6    Number of Visits  12    Date for PT Re-Evaluation  12/31/16    Authorization Type  medicare    Authorization - Visit Number  6    Authorization - Number of Visits  10    PT Start Time  1033    PT Stop Time  1117    PT Time Calculation (min)  44 min    Activity Tolerance  Patient tolerated treatment well    Behavior During Therapy  [facility] for tasks assessed/performed       Past Medical History:  Diagnosis Date  . Arthritis   . Asthma   . Colon polyp   . Coronary atherosclerosis of native coronary artery    Diseased nondominant RCA  . DVT (deep venous thrombosis) ([place]) 2005   Right arm  . Essential hypertension, benign   . Headache   . History of transfusion   . Hypersomnia    CPAP of 16 cm, diagnosed with AHI of 60 in 2012,epworth 21- narcolepsy?  [name] Hypothyroidism   . Mixed hyperlipidemia   . Morbid obesity ([place])   . MRSA (methicillin resistant staph aureus) culture positive    08/2012  . Narcolepsy   . OSA (obstructive sleep apnea)   . Pneumonia   . Psoriasis   . Pulmonary embolism ([place]) 2004  . RLS (restless legs syndrome)   . Rotator cuff disorder    Left  . Septic arthritis of knee, left ([place])   . Skin cancer, basal cell   . Type 2 diabetes mellitus ([place])     Past Surgical History:  Procedure Laterality Date  . BACK SURGERY    . CATARACT EXTRACTION Bilateral   . COLONOSCOPY    . EYE SURGERY Left 2016   laser to left eye  . HIP SURGERY     bone removed from both sides of hip  . KNEE ARTHROTOMY Right 12/04/2014   Procedure: KNEE ARTHROTOMY PATELLA LIGAMENT RECONSTRUSION AND REPAIR RIGHT  KNEE;  Surgeon: Paralee Cancel, MD;  Location: [facility];  Service: Orthopedics;  Laterality: Right;  . KNEE SURGERY     X 25 TIMES  . LUMBAR DISC SURGERY     Left L3, L4, L5 discecotomy with decompression of L4 root  . TONSILLECTOMY    . TOTAL KNEE ARTHROPLASTY  2003   LEFT  . TOTAL KNEE ARTHROPLASTY Right 03/23/2014   Procedure: RIGHT TOTAL KNEE ARTHROPLASTY AND REMOVAL RIGHT TIBIAL  DEEP IMPLANT STAPLE;  Surgeon: Paralee Cancel, MD;  [facility];  Service: Orthopedics;  Laterality: Right;  . TOTAL KNEE REVISION  2005   LEFT  . WRIST SURGERY      There were no vitals filed for this visit.  Subjective Assessment - 12/28/16 1037    Subjective  PT states that he is having pain on the latera aspect of his ankle     Pertinent History  DM, Rt TKR, Spinal stenosis, CAD, rotator cuff disorder.     Limitations  Standing;Walking;House hold activities    How long can you stand comfortably?  immediately has increased pain     How long can  you walk comfortably?  Pt has increased pain immediately upon weight bearing but will continue to do what he needs to get done.     Patient Stated Goals  less pain, better sleep he wakes up 3-4 times a night.     Pain Score  3     Pain Orientation  Lateral;Left    Pain Descriptors / Indicators  Aching    Pain Type  Chronic pain    Pain Onset  More than a month ago                      [facility] Adult PT Treatment/Exercise - 12/28/16 0001      Exercises   Exercises  Ankle      Knee/Hip Exercises: Stretches   Active Hamstring Stretch  Both;2 reps;30 seconds    Gastroc Stretch  Both;3 reps;30 seconds Lumbar extension stretch for improved posture.       Manual Therapy   Manual Therapy  --    Manual therapy comments  --    Joint Mobilization  --    Soft tissue mobilization  --      Ankle Exercises: Stretches   Slant Board Stretch  --      Ankle Exercises: Standing   BAPS  Standing;Level 3;10 reps    Vector Stance  Right;Left    Vector  Stance Limitations  3reps 5 second hold    SLS  LLE only on foam with 1UE support x2 mins total; SLS on LLE only + vectors x5RT    Rocker Board  2 minutes    Rocker Board Limitations  RT/Lt; and Ant/post     Rebounder  -- Lunge onto Bosu hold 10 seconds x 10 B     Heel Raises  10 reps;Limitations    Heel Raises Limitations  Lt only     Toe Raise  10 reps    Heel Walk (Round Trip)  2 x     Toe Walk (Round Trip)  2x forward, back and side step     Other Standing Ankle Exercises  tandem stance on foam + UE flexion with 2# weight bar 2x10 reps each; sidestepping on foam x3RT               PT Short Term Goals - 12/01/16 1208      PT SHORT TERM GOAL #1   Title  Pt Lt ankle dorsiflexion to increase by 10 degrees to allow a more normalized heel toe gait pattern     Time  3    Period  Weeks    Status  New    Target Date  12/22/16      PT SHORT TERM GOAL #2   Title  Pt Lt ankle pain to be no greater than a 5/10 to allow pt to only be waking up 2x a night due to lt ankle pain     Time  3    Period  Weeks      PT SHORT TERM GOAL #3   Title  Pt to be able to single leg stance for 30 seconds on both LE to reduce risk of falling     Time  3    Period  Weeks    Status  New        PT Long Term Goals - 12/01/16 1211      PT LONG TERM GOAL #1   Title  Pt Lt dorsiflexion to be increased by 20 degrees  to allow a more normalized gait pattern to decrease joint stress.     Time  6    Period  Weeks    Status  New    Target Date  01/12/17      PT LONG TERM GOAL #2   Title  Pt left ankle pain to be no greater than a 2/10 to allow pt to sleep throughout the night.     Time  6    Period  Weeks    Status  New      PT LONG TERM GOAL #3   Title  Pt to be able to single leg stance for 45 seconds on both LE to allow pt to walk on uneven terrain in comfort.     Time  6    Period  Weeks    Status  New            Plan - 12/28/16 1118    Clinical Impression Statement  PT pain  level is going down but still present from the slip on the curb.  Pt continues to be very tight and have decreaed stability of the ankles.  Added Bosu and balance beam activty with verbal cuing needed to keep feet aligned properly and reminding to be using his ankle mm and not his body to complete exercises.     Rehab Potential  Good    PT Frequency  2x / week    PT Duration  6 weeks    PT Treatment/Interventions  Patient/family education;Therapeutic exercise;Therapeutic activities;Balance training;Passive range of motion;Manual lymph drainage;Gait training;Stair training;Cryotherapy    PT Next Visit Plan  increase to 15 seconds on vector stances; D/C rocker board and begin Pension scheme manager .     PT Home Exercise Plan  EVAL;  ROM, Alphebet, sitting heel/toe raises     Consulted and Agree with Plan of Care  Patient       Patient will benefit from skilled therapeutic intervention in order to improve the following deficits and impairments:  Abnormal gait, Decreased activity tolerance, Decreased balance, Decreased strength, Decreased range of motion, Difficulty walking, Increased edema, Pain, Postural dysfunction  Visit Diagnosis: Stiffness of left ankle, not elsewhere classified  Pain in left ankle and joints of left foot  Difficulty in walking, not elsewhere classified     Problem List Patient Active Problem List   Diagnosis Date Noted  . Right patellar tendon rupture 12/04/2014  . Hyperlipidemia 11/25/2014  . Other specified hypothyroidism 11/25/2014  . S/P right TKA 03/23/2014  . S/P knee replacement 03/23/2014  . Spinal stenosis of cervicothoracic region 10/08/2013  . RLS (restless legs syndrome) 10/08/2013  . DM type 2 causing vascular disease ([place]) 10/08/2013  . Preoperative cardiovascular examination 08/14/2013  . Sleep apnea with use of continuous positive airway pressure (CPAP) 04/02/2013  . 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/02/2013  . Diabetic neuropathy  ([place]) 07/23/2012  . Obesity, morbid ([place]) 05/01/2012  . Hypersomnia, persistent 05/01/2012  . OSA on CPAP 04/19/2012  . Essential hypertension, benign 12/16/2009  . CORONARY ATHEROSCLEROSIS NATIVE CORONARY ARTERY 12/16/2009    Rayetta Humphrey, PT CLT [phone] 12/28/2016, 11:21 AM  [facility] [address] [place], [place], [address] Phone: [phone]   Fax:  [phone]  Name: Paul Baker MRN: [id number] Date of Birth: 11-Jul-1950

## 2017-01-01 ENCOUNTER — Other Ambulatory Visit: Payer: Self-pay | Admitting: Cardiology

## 2017-01-01 ENCOUNTER — Telehealth (HOSPITAL_COMMUNITY): Payer: Self-pay | Admitting: Physical Therapy

## 2017-01-01 ENCOUNTER — Other Ambulatory Visit: Payer: Self-pay | Admitting: "Endocrinology

## 2017-01-01 ENCOUNTER — Ambulatory Visit (HOSPITAL_COMMUNITY): Payer: Medicare Other | Admitting: Physical Therapy

## 2017-01-01 ENCOUNTER — Other Ambulatory Visit: Payer: Self-pay | Admitting: Neurology

## 2017-01-01 DIAGNOSIS — G2581 Restless legs syndrome: Secondary | ICD-10-CM

## 2017-01-01 NOTE — Telephone Encounter (Signed)
Called pt re no show.  Reminded pt of his next appointment on 01/04/2017 and to please call if he will not be able to make this appointment.   Rayetta Humphrey, [place] CLT [phone]

## 2017-01-04 ENCOUNTER — Ambulatory Visit (HOSPITAL_COMMUNITY): Payer: Medicare Other | Admitting: Physical Therapy

## 2017-01-04 ENCOUNTER — Telehealth (HOSPITAL_COMMUNITY): Payer: Self-pay | Admitting: Family Medicine

## 2017-01-04 NOTE — Telephone Encounter (Signed)
01/04/17  pt called and said he was cancelling all appts he was going back to the dr

## 2017-01-09 ENCOUNTER — Encounter (HOSPITAL_COMMUNITY): Payer: Medicare Other | Admitting: Physical Therapy

## 2017-01-09 DIAGNOSIS — E291 Testicular hypofunction: Secondary | ICD-10-CM | POA: Diagnosis not present

## 2017-01-10 ENCOUNTER — Ambulatory Visit (INDEPENDENT_AMBULATORY_CARE_PROVIDER_SITE_OTHER): Payer: Medicare Other | Admitting: "Endocrinology

## 2017-01-10 ENCOUNTER — Encounter: Payer: Self-pay | Admitting: "Endocrinology

## 2017-01-10 VITALS — BP 136/84 | HR 76 | Ht 65.0 in | Wt 218.0 lb

## 2017-01-10 DIAGNOSIS — E782 Mixed hyperlipidemia: Secondary | ICD-10-CM

## 2017-01-10 DIAGNOSIS — I10 Essential (primary) hypertension: Secondary | ICD-10-CM

## 2017-01-10 DIAGNOSIS — E038 Other specified hypothyroidism: Secondary | ICD-10-CM

## 2017-01-10 DIAGNOSIS — E1159 Type 2 diabetes mellitus with other circulatory complications: Secondary | ICD-10-CM

## 2017-01-10 MED ORDER — FREESTYLE LIBRE SENSOR SYSTEM MISC: 2 refills | Status: DC

## 2017-01-10 MED ORDER — FREESTYLE LIBRE READER DEVI: 1.0000 | Freq: Once | 0 refills | Status: AC

## 2017-01-10 NOTE — Progress Notes (Signed)
Subjective:    Patient ID: Paul Baker, male    DOB: [birth year], PCP [name], MD   Past Medical History:  Diagnosis Date  . Arthritis   . Asthma   . Colon polyp   . Coronary atherosclerosis of native coronary artery    Diseased nondominant RCA  . DVT (deep venous thrombosis) ([place]) 2005   Right arm  . Essential hypertension, benign   . Headache   . History of transfusion   . Hypersomnia    CPAP of 16 cm, diagnosed with AHI of 60 in 2012,epworth 21- narcolepsy?  [name] Hypothyroidism   . Mixed hyperlipidemia   . Morbid obesity ([place])   . MRSA (methicillin resistant staph aureus) culture positive    08/2012  . Narcolepsy   . OSA (obstructive sleep apnea)   . Pneumonia   . Psoriasis   . Pulmonary embolism ([place]) 2004  . RLS (restless legs syndrome)   . Rotator cuff disorder    Left  . Septic arthritis of knee, left ([place])   . Skin cancer, basal cell   . Type 2 diabetes mellitus ([place])    Past Surgical History:  Procedure Laterality Date  . BACK SURGERY    . CATARACT EXTRACTION Bilateral   . COLONOSCOPY    . EYE SURGERY Left 2016   laser to left eye  . HIP SURGERY     bone removed from both sides of hip  . KNEE ARTHROTOMY Right 12/04/2014   Procedure: KNEE ARTHROTOMY PATELLA LIGAMENT RECONSTRUSION AND REPAIR RIGHT KNEE;  Surgeon: Paralee Cancel, MD;  Location: [facility];  Service: Orthopedics;  Laterality: Right;  . KNEE SURGERY     X 25 TIMES  . LUMBAR DISC SURGERY     Left L3, L4, L5 discecotomy with decompression of L4 root  . TONSILLECTOMY    . TOTAL KNEE ARTHROPLASTY  2003   LEFT  . TOTAL KNEE ARTHROPLASTY Right 03/23/2014   Procedure: RIGHT TOTAL KNEE ARTHROPLASTY AND REMOVAL RIGHT TIBIAL  DEEP IMPLANT STAPLE;  Surgeon: Paralee Cancel, MD;  Location: [facility];  Service: Orthopedics;  Laterality: Right;  . TOTAL KNEE REVISION  2005   LEFT  . WRIST SURGERY     Social History   Socioeconomic History  . Marital status: Married    Spouse name: Toney Reil  .  Number of children: 2  . Years of education: college  . Highest education level: None  Social Needs  . Financial resource strain: None  . Food insecurity - worry: None  . Food insecurity - inability: None  . Transportation needs - medical: None  . Transportation needs - non-medical: None  Occupational History  . Occupation: Disabled    Employer: UNEMPLOYED  Tobacco Use  . Smoking status: Former Smoker    Types: Cigarettes    Start date: 06/19/1967    Last attempt to quit: 01/03/1995    Years since quitting: 22.0  . Smokeless tobacco: Never Used  Substance and Sexual Activity  . Alcohol use: No    Alcohol/week: 0.0 oz    Comment: quit drinking in 07/86  . Drug use: No  . Sexual activity: Yes    Partners: Female  Other Topics Concern  . None  Social History Narrative    67 year old, right-handed, caucasian male with a past medical history of obesity, hypertension, hyperlipidemia, diabetes, obstructive sleep apnea, presenting with frequent nighttime awakenings, excessive daytime sleepiness, also transient confusional episodes.RLS and one beosity, OSA on CPAP with AHI of  3.2 and  setting of 16 cm water , [facility] .   Outpatient Encounter Medications as of 01/10/2017  Medication Sig  . amLODipine (NORVASC) 5 MG tablet TAKE ONE TABLET BY MOUTH ONCE DAILY.  [name] B-D ULTRAFINE III SHORT PEN 31G X 8 MM MISC USING FIVE TIMES DAILY.  . clobetasol (TEMOVATE) 0.05 % external solution Apply 1 application topically daily as needed (irritation.).   [name] clonazePAM (KLONOPIN) 1 MG tablet TAKE (1) TABLET BY MOUTH AT BEDTIME FOR RESTLESS LEGS.  [name] Continuous Blood Gluc Sensor (FREESTYLE LIBRE SENSOR SYSTEM) MISC Use one sensor every 10 days.  . fenofibrate 160 MG tablet TAKE (1) TABLET BY MOUTH AT BEDTIME.  . furosemide (LASIX) 40 MG tablet Take 40 mg by mouth every other day.   [name] HUMULIN R U-500 KWIKPEN 500 UNIT/ML kwikpen INJECT 70 UNITS WITH BREAKFAST, 70 UNITS WITH LUNCH AND 40 UNITS WITHSUPPER  IF PRE-MEAL BLOOD SUGAR IS ABOVE 90MG /DL.  [name] isosorbide mononitrate (IMDUR) 60 MG 24 hr tablet TAKE (1/2) TABLET BY MOUTH TWICE DAILY.  [name] levothyroxine (SYNTHROID, LEVOTHROID) 137 MCG tablet TAKE (1) TABLET BY MOUTH EACH MORNING BEFORE BREAKFAST.  [name] loratadine (CLARITIN) 10 MG tablet Take 10 mg by mouth at bedtime as needed for allergies.   [name] losartan (COZAAR) 50 MG tablet Take 50 mg by mouth at bedtime. Reported on 06/18/2015  . metFORMIN (GLUCOPHAGE) 500 MG tablet TAKE 2 TABLETS EACH MORNING, 1 TABLET AT NOON, AND 2 TABLETS EVERY EVENING. (Patient taking differently: 1 tablet twice a day)  . metoprolol succinate (TOPROL-XL) 50 MG 24 hr tablet TAKE (1) TABLET BY MOUTH TWICE DAILY.  . modafinil (PROVIGIL) 200 MG tablet TAKE ONE TABLET BY MOUTH ONCE DAILY.  . nitroGLYCERIN (NITROSTAT) 0.4 MG SL tablet DISSOLVE 1 TABLET UNDER TONGUE EVERY 5 MINUTES UP TO 15 MIN FOR CHESTPAIN. IF NO RELIEF CALL 911.  [name] ONE TOUCH ULTRA TEST test strip TEST BLOOD SUGAR UP TO 4 TIMES DAILY.  [name] ONETOUCH DELICA LANCETS 50D MISC USE AS DIRECTED TO TEST BLOOD SUGAR 4 TIMES DAILY.  . pravastatin (PRAVACHOL) 20 MG tablet TAKE (1) TABLET BY MOUTH ONCE DAILY.  [name] rOPINIRole (REQUIP XL) 2 MG 24 hr tablet Take 1 tablet (2 mg total) by mouth at bedtime.  [name] rOPINIRole (REQUIP) 4 MG tablet TAKE 1/2 TABLET BY MOUTH AT LUNCH AND 1 TABLET BY MOUTH AT BEDTIME.  . sodium chloride (OCEAN) 0.65 % SOLN nasal spray Place 1-2 sprays into both nostrils daily as needed for congestion.  . Testosterone Cypionate & Prop 200-20 MG/ML SOLN Inject 1 mL into the muscle every 14 (fourteen) days.  . Vitamin D, Ergocalciferol, (DRISDOL) 50000 units CAPS capsule TAKE 1 CAPSULE BY MOUTH ONCE A WEEK.   No facility-administered encounter medications on file as of 01/10/2017.    ALLERGIES: Allergies  Allergen Reactions  . Cardizem [Diltiazem Hcl]     Edema   . Cardura [Doxazosin Mesylate]     Side effects  . Lipitor [Atorvastatin] Other (See Comments)     Leg cramps  . Paxil [Paroxetine Hcl]     Side effects   . Adhesive [Tape] Rash  . Codeine Rash and Other (See Comments)    Headache    VACCINATION STATUS: Immunization History  Administered Date(s) Administered  . Influenza Split 09/18/2012  . Influenza,inj,Quad PF,6+ Mos 10/13/2013, 10/06/2014  . Influenza-Unspecified 12/01/2015, 11/08/2016  . Pneumococcal Polysaccharide-23 10/02/2004, 10/03/2011  . Td 05/24/2006    Diabetes  He presents for his follow-up diabetic visit. He  has type 2 diabetes mellitus. Onset time: Was diagnosed at approximate age of 57 years. His disease course has been worsening. There are no hypoglycemic associated symptoms. Pertinent negatives for hypoglycemia include no confusion, headaches, pallor or seizures. Associated symptoms include polydipsia and polyuria. Pertinent negatives for diabetes include no chest pain, no fatigue, no polyphagia and no weakness. There are no hypoglycemic complications. Symptoms are worsening. Diabetic complications include heart disease. Risk factors for coronary artery disease include diabetes mellitus, dyslipidemia, male sex, obesity, sedentary lifestyle and tobacco exposure. Current diabetic treatment includes intensive insulin program. He is compliant with treatment most of the time. His weight is stable. He is following a generally unhealthy diet. He has had a previous visit with a dietitian. He never participates in exercise. There is no change in his home blood glucose trend. His breakfast blood glucose range is generally 140-180 mg/dl. His lunch blood glucose range is generally >200 mg/dl. His dinner blood glucose range is generally >200 mg/dl. His bedtime blood glucose range is generally >200 mg/dl. His overall blood glucose range is >200 mg/dl. An ACE inhibitor/angiotensin II receptor blocker is being taken. He sees a podiatrist.Eye exam is current.  Hyperlipidemia  This is a chronic problem. The current episode started more than 1  year ago. Pertinent negatives include no chest pain, myalgias or shortness of breath. Current antihyperlipidemic treatment includes statins. Risk factors for coronary artery disease include dyslipidemia, diabetes mellitus, hypertension, male sex, obesity and a sedentary lifestyle.  Hypertension  This is a chronic problem. The current episode started more than 1 year ago. Pertinent negatives include no chest pain, headaches, neck pain, palpitations or shortness of breath. Past treatments include angiotensin blockers. Hypertensive end-organ damage includes CAD/MI. Identifiable causes of hypertension include a thyroid problem.  Thyroid Problem  Presents for follow-up visit. Patient reports no constipation, diarrhea, fatigue or palpitations. The symptoms have been improving. Past treatments include levothyroxine. His past medical history is significant for hyperlipidemia.     Review of Systems  Constitutional: Negative for fatigue and unexpected weight change.  HENT: Negative for dental problem, mouth sores and trouble swallowing.   Eyes: Negative for visual disturbance.  Respiratory: Negative for cough, choking, chest tightness, shortness of breath and wheezing.   Cardiovascular: Negative for chest pain, palpitations and leg swelling.  Gastrointestinal: Negative for abdominal distention, abdominal pain, constipation, diarrhea, nausea and vomiting.  Endocrine: Positive for polydipsia and polyuria. Negative for polyphagia.  Genitourinary: Negative for dysuria, flank pain, hematuria and urgency.  Musculoskeletal: Negative for back pain, gait problem, myalgias and neck pain.  Skin: Negative for pallor, rash and wound.  Neurological: Negative for seizures, syncope, weakness, numbness and headaches.  Psychiatric/Behavioral: Negative.  Negative for confusion and dysphoric mood.    Objective:    BP 136/84   Pulse 76   Ht 5\' 5"  (1.651 m)   Wt 218 lb (98.9 kg)   BMI 36.28 kg/m   Wt Readings from  Last 3 Encounters:  01/10/17 218 lb (98.9 kg)  11/29/16 221 lb (100.2 kg)  09/25/16 218 lb (98.9 kg)    Physical Exam  Constitutional: He is oriented to person, place, and time. He appears well-developed and well-nourished. He is cooperative. No distress.  HENT:  Head: Normocephalic and atraumatic.  Eyes: EOM are normal.  Neck: Normal range of motion. Neck supple. No tracheal deviation present. No thyromegaly present.  Cardiovascular: Normal rate, S1 normal, S2 normal and normal heart sounds. Exam reveals no gallop.  No murmur heard. Pulses:  Dorsalis pedis pulses are 1+ on the right side, and 1+ on the left side.       Posterior tibial pulses are 1+ on the right side, and 1+ on the left side.  Pulmonary/Chest: Breath sounds normal. No respiratory distress. He has no wheezes.  Abdominal: Soft. Bowel sounds are normal. He exhibits no distension. There is no tenderness. There is no guarding and no CVA tenderness.  Musculoskeletal: He exhibits no edema.       Right shoulder: He exhibits no swelling and no deformity.  Neurological: He is alert and oriented to person, place, and time. He has normal strength and normal reflexes. No cranial nerve deficit or sensory deficit. Gait normal.  Skin: Skin is warm and dry. No rash noted. No cyanosis. Nails show no clubbing.  Psychiatric: He has a normal mood and affect. His speech is normal and behavior is normal. Judgment and thought content normal. Cognition and memory are normal.    Results for orders placed or performed in visit on 09/25/16  COMPLETE METABOLIC PANEL WITH GFR  Result Value Ref Range   Glucose, Bld 179 (H) 65 - 99 mg/dL   BUN 25 7 - 25 mg/dL   Creat 1.38 (H) 0.70 - 1.25 mg/dL   GFR, Est Non African American 53 (L) > OR = 60 mL/min/1.66m2   GFR, Est African American 62 > OR = 60 mL/min/1.71m2   BUN/Creatinine Ratio 18 6 - 22 (calc)   Sodium 137 135 - 146 mmol/L   Potassium 4.7 3.5 - 5.3 mmol/L   Chloride 101 98 - 110 mmol/L    CO2 26 20 - 32 mmol/L   Calcium 9.8 8.6 - 10.3 mg/dL   Total Protein 6.2 6.1 - 8.1 g/dL   Albumin 4.1 3.6 - 5.1 g/dL   Globulin 2.1 1.9 - 3.7 g/dL (calc)   AG Ratio 2.0 1.0 - 2.5 (calc)   Total Bilirubin 0.3 0.2 - 1.2 mg/dL   Alkaline phosphatase (APISO) 33 (L) 40 - 115 U/L   AST 16 10 - 35 U/L   ALT 12 9 - 46 U/L  Hemoglobin A1c  Result Value Ref Range   Hgb A1c MFr Bld 9.1 (H) <5.7 % of total Hgb   Mean Plasma Glucose 214 (calc)   eAG (mmol/L) 11.9 (calc)  TSH  Result Value Ref Range   TSH 1.24 0.40 - 4.50 mIU/L  T4, free  Result Value Ref Range   Free T4 1.3 0.8 - 1.8 ng/dL   Diabetic Labs (most recent): Lab Results  Component Value Date   HGBA1C 9.1 (H) 12/21/2016   HGBA1C 9.2 (H) 09/05/2016   HGBA1C 8.1 (H) 05/04/2016   Lipid profile (most recent): Lab Results  Component Value Date   TRIG 77 08/19/2015   CHOL 148 08/19/2015     Assessment & Plan:   1. Uncontrolled type 2 diabetes mellitus with other circulatory complication, with long-term current use of insulin (HCC)  -His diabetes is  complicated by ordinary artery disease and patient remains at a high risk for more acute and chronic complications of diabetes which include CAD, CVA, CKD, retinopathy, and neuropathy. These are all discussed in detail with the patient.  Patient came with better but still fluctuating  glucose profile even  on a  largeer dose of insulin, and  recent A1c 9.1% increasing from 8.1%.   Glucose logs and insulin administration records pertaining to this visit,  to be scanned into patient's records.  Recent labs reviewed.   -  I have re-counseled the patient on diet management and weight loss  by adopting a carbohydrate restricted / protein rich  Diet.  -  Suggestion is made for him to avoid simple carbohydrates  from his diet including Cakes, Sweet Desserts / Pastries, Ice Cream, Soda (diet and regular), Sweet Tea, Candies, Chips, Cookies, Store Bought Juices, Alcohol in Excess of   1-2 drinks a day, Artificial Sweeteners, and "Sugar-free" Products. This will help patient to have stable blood glucose profile and potentially avoid unintended weight gain.  - Patient is advised to stick to a routine mealtimes to eat 3 meals  a day and avoid unnecessary snacks ( to snack only to correct hypoglycemia).   - I have approached patient with the following individualized plan to manage diabetes and patient agrees. - He is doing better with U500 insulin. His most recent couple of weeks blood glucose readings are near target. - I will continue his current dose of Humulin U 500 70 units with breakfast, 70 units with lunch, and continue at 40 units with supper when pre-meal blood glucose is above 90 mg/dL. - He is advised to continue strict monitoring of blood glucose before meals and at bedtime. -Patient is encouraged to call clinic for blood glucose levels less than 70 or above 200 mg /dl x 3. - I will continue low-dose metformin 500 mg by mouth twice a day. -  He will benefit from continuous glucose monitoring. His asking for prescription for the [facility] device.  - Patient will be considered for incretin therapy as appropriate next visit. - Patient specific target  for A1c; LDL, HDL, Triglycerides, and  Waist Circumference were discussed in detail.  2) BP/HTN: Blood pressure is controlled to target, I have advised him to continue his current medications including ACEI/ARB. 3) Lipids/HPL:  continue statins. 4)  Weight/Diet: CDE consult in progress, exercise, and carbohydrates information provided.  5) hypothyroidism - His recent thyroid function tests are consistent with appropriate replacement. I advised him to continue levothyroxine 137 g by mouth every morning.   - We discussed about correct intake of levothyroxine, at fasting, with water, separated by at least 30 minutes from breakfast, and separated by more than 4 hours from calcium, iron, multivitamins, acid reflux  medications (PPIs). -Patient is made aware of the fact that thyroid hormone replacement is needed for life, dose to be adjusted by periodic monitoring of thyroid function tests.  6) Chronic Care/Health Maintenance:  -Patient is on ACEI/ARB and Statin medications and encouraged to continue to follow up with Ophthalmology, Podiatrist at least yearly or according to recommendations, and advised to  stay away from smoking. I have recommended yearly flu vaccine and pneumonia vaccination at least every 5 years; moderate intensity exercise for up to 150 minutes weekly; and  sleep for at least 7 hours a day.  - Time spent with the patient: 25 min, of which >50% was spent in reviewing his blood glucose logs , discussing his hypo- and hyper-glycemic episodes, reviewing his current and  previous labs and insulin doses and developing a plan to avoid hypo- and hyper-glycemia. Please refer to Patient Instructions for Blood Glucose Monitoring and Insulin/Medications Dosing Guide"  in media tab for additional information.  - I advised patient to maintain close follow up with [name], MD for primary care needs.   Follow up plan: -No Follow-up on file.  Glade Lloyd, MD Phone: [phone]  Fax: [phone]  This note was partially dictated with voice recognition software. Similar  sounding words can be transcribed inadequately or may not  be corrected upon review.  01/10/2017, 9:15 AM

## 2017-01-10 NOTE — Patient Instructions (Signed)

## 2017-01-11 ENCOUNTER — Encounter (HOSPITAL_COMMUNITY): Payer: Medicare Other | Admitting: Physical Therapy

## 2017-01-23 DIAGNOSIS — E291 Testicular hypofunction: Secondary | ICD-10-CM | POA: Diagnosis not present

## 2017-01-29 DIAGNOSIS — H109 Unspecified conjunctivitis: Secondary | ICD-10-CM | POA: Diagnosis not present

## 2017-02-03 ENCOUNTER — Other Ambulatory Visit: Payer: Self-pay | Admitting: Cardiology

## 2017-02-03 ENCOUNTER — Other Ambulatory Visit: Payer: Self-pay | Admitting: "Endocrinology

## 2017-02-05 ENCOUNTER — Encounter (HOSPITAL_COMMUNITY): Payer: Medicare Other | Admitting: Physical Therapy

## 2017-02-06 DIAGNOSIS — E291 Testicular hypofunction: Secondary | ICD-10-CM | POA: Diagnosis not present

## 2017-02-07 DIAGNOSIS — L918 Other hypertrophic disorders of the skin: Secondary | ICD-10-CM | POA: Diagnosis not present

## 2017-02-07 DIAGNOSIS — L400 Psoriasis vulgaris: Secondary | ICD-10-CM | POA: Diagnosis not present

## 2017-02-07 DIAGNOSIS — B078 Other viral warts: Secondary | ICD-10-CM | POA: Diagnosis not present

## 2017-02-07 DIAGNOSIS — L723 Sebaceous cyst: Secondary | ICD-10-CM | POA: Diagnosis not present

## 2017-02-08 ENCOUNTER — Encounter (HOSPITAL_COMMUNITY): Payer: Medicare Other | Admitting: Physical Therapy

## 2017-02-10 ENCOUNTER — Other Ambulatory Visit: Payer: Self-pay | Admitting: Neurology

## 2017-02-10 DIAGNOSIS — G2581 Restless legs syndrome: Secondary | ICD-10-CM

## 2017-02-13 NOTE — Telephone Encounter (Signed)
Faxed printed/signed rx to [facility] at [phone]. Received fax confirmation.

## 2017-02-26 DIAGNOSIS — L720 Epidermal cyst: Secondary | ICD-10-CM | POA: Diagnosis not present

## 2017-03-03 ENCOUNTER — Other Ambulatory Visit: Payer: Self-pay | Admitting: "Endocrinology

## 2017-03-12 ENCOUNTER — Other Ambulatory Visit: Payer: Self-pay | Admitting: "Endocrinology

## 2017-03-29 ENCOUNTER — Other Ambulatory Visit: Payer: Self-pay

## 2017-03-29 ENCOUNTER — Emergency Department (HOSPITAL_COMMUNITY): Payer: Medicare Other

## 2017-03-29 ENCOUNTER — Encounter (HOSPITAL_COMMUNITY): Payer: Self-pay | Admitting: *Deleted

## 2017-03-29 ENCOUNTER — Observation Stay (HOSPITAL_COMMUNITY)
Admission: EM | Admit: 2017-03-29 | Discharge: 2017-03-31 | Disposition: A | Discharge status: Home / Self Care | Payer: Medicare Other | Attending: Internal Medicine | Admitting: Internal Medicine

## 2017-03-29 DIAGNOSIS — Z7902 Long term (current) use of antithrombotics/antiplatelets: Secondary | ICD-10-CM | POA: Diagnosis not present

## 2017-03-29 DIAGNOSIS — E11649 Type 2 diabetes mellitus with hypoglycemia without coma: Secondary | ICD-10-CM | POA: Diagnosis not present

## 2017-03-29 DIAGNOSIS — Z87891 Personal history of nicotine dependence: Secondary | ICD-10-CM | POA: Insufficient documentation

## 2017-03-29 DIAGNOSIS — I2510 Atherosclerotic heart disease of native coronary artery without angina pectoris: Secondary | ICD-10-CM | POA: Diagnosis not present

## 2017-03-29 DIAGNOSIS — Z6837 Body mass index (BMI) 37.0-37.9, adult: Secondary | ICD-10-CM | POA: Insufficient documentation

## 2017-03-29 DIAGNOSIS — N179 Acute kidney failure, unspecified: Principal | ICD-10-CM | POA: Diagnosis present

## 2017-03-29 DIAGNOSIS — J45909 Unspecified asthma, uncomplicated: Secondary | ICD-10-CM | POA: Insufficient documentation

## 2017-03-29 DIAGNOSIS — E162 Hypoglycemia, unspecified: Secondary | ICD-10-CM | POA: Diagnosis present

## 2017-03-29 DIAGNOSIS — I10 Essential (primary) hypertension: Secondary | ICD-10-CM | POA: Diagnosis not present

## 2017-03-29 DIAGNOSIS — E782 Mixed hyperlipidemia: Secondary | ICD-10-CM | POA: Diagnosis present

## 2017-03-29 DIAGNOSIS — G4733 Obstructive sleep apnea (adult) (pediatric): Secondary | ICD-10-CM | POA: Diagnosis not present

## 2017-03-29 DIAGNOSIS — E119 Type 2 diabetes mellitus without complications: Secondary | ICD-10-CM | POA: Insufficient documentation

## 2017-03-29 DIAGNOSIS — R079 Chest pain, unspecified: Secondary | ICD-10-CM | POA: Diagnosis present

## 2017-03-29 DIAGNOSIS — E039 Hypothyroidism, unspecified: Secondary | ICD-10-CM | POA: Diagnosis not present

## 2017-03-29 DIAGNOSIS — R0602 Shortness of breath: Secondary | ICD-10-CM | POA: Diagnosis not present

## 2017-03-29 DIAGNOSIS — Z9989 Dependence on other enabling machines and devices: Secondary | ICD-10-CM

## 2017-03-29 DIAGNOSIS — Z79899 Other long term (current) drug therapy: Secondary | ICD-10-CM | POA: Insufficient documentation

## 2017-03-29 DIAGNOSIS — E1159 Type 2 diabetes mellitus with other circulatory complications: Secondary | ICD-10-CM | POA: Diagnosis present

## 2017-03-29 DIAGNOSIS — E785 Hyperlipidemia, unspecified: Secondary | ICD-10-CM | POA: Diagnosis not present

## 2017-03-29 DIAGNOSIS — E038 Other specified hypothyroidism: Secondary | ICD-10-CM | POA: Diagnosis present

## 2017-03-29 DIAGNOSIS — R05 Cough: Secondary | ICD-10-CM | POA: Diagnosis not present

## 2017-03-29 DIAGNOSIS — Z96652 Presence of left artificial knee joint: Secondary | ICD-10-CM | POA: Diagnosis not present

## 2017-03-29 DIAGNOSIS — E1169 Type 2 diabetes mellitus with other specified complication: Secondary | ICD-10-CM | POA: Diagnosis present

## 2017-03-29 LAB — CBG MONITORING, ED: GLUCOSE-CAPILLARY: 77 mg/dL (ref 65–99)

## 2017-03-29 NOTE — ED Triage Notes (Signed)
Pt states his cbg's have been reading low for the last couple of days with the last one reading 36; pt also c/o left side chest pain that started  About an hour ago while eating;

## 2017-03-29 NOTE — ED Provider Notes (Signed)
[facility] EMERGENCY DEPARTMENT Provider Note   CSN: [id number] Arrival date & time: 03/29/17  2300     History   Chief Complaint Chief Complaint  Patient presents with  . Hypoglycemia    HPI Paul Baker is a 67 y.o. male.  The history is provided by the patient and the spouse.  Hypoglycemia  Severity:  Severe Onset quality:  Gradual Duration:  2 days Timing:  Intermittent Progression:  Worsening Chronicity:  New Diabetic status:  Controlled with insulin and controlled with oral medications Relieved by:  Nothing Ineffective treatments:  Eating Associated symptoms: shortness of breath and sweats   Patient with history of diabetes, CAD presents with hypoglycemia.  Reports for the past 2 days been having glucose dropped down into the 30s.  He will wake up at night in sweat, check his glucose to be in the 40s.  Today he had multiple drops down to 30s.  He has ate multiple foods that should increase his blood glucose, but this is not improved.  He uses Humulin R, last dose was at noon today.  He may have taken his evening metformin.  He also reports chest pain shortness of breath of the past several hours.  He reports is in his left chest and radiates to his left arm.  Past Medical History:  Diagnosis Date  . Arthritis   . Asthma   . Colon polyp   . Coronary atherosclerosis of native coronary artery    Diseased nondominant RCA  . DVT (deep venous thrombosis) ([place]) 2005   Right arm  . Essential hypertension, benign   . Headache   . History of transfusion   . Hypersomnia    CPAP of 16 cm, diagnosed with AHI of 60 in 2012,epworth 21- narcolepsy?  [name] Hypothyroidism   . Mixed hyperlipidemia   . Morbid obesity ([place])   . MRSA (methicillin resistant staph aureus) culture positive    08/2012  . Narcolepsy   . OSA (obstructive sleep apnea)   . Pneumonia   . Psoriasis   . Pulmonary embolism ([place]) 2004  . RLS (restless legs syndrome)   . Rotator cuff disorder    Left   . Septic arthritis of knee, left ([place])   . Skin cancer, basal cell   . Type 2 diabetes mellitus [facility])     Patient Active Problem List   Diagnosis Date Noted  . Right patellar tendon rupture 12/04/2014  . Mixed hyperlipidemia 11/25/2014  . Other specified hypothyroidism 11/25/2014  . S/P right TKA 03/23/2014  . S/P knee replacement 03/23/2014  . Spinal stenosis of cervicothoracic region 10/08/2013  . RLS (restless legs syndrome) 10/08/2013  . DM type 2 causing vascular disease ([place]) 10/08/2013  . Preoperative cardiovascular examination 08/14/2013  . Sleep apnea with use of continuous positive airway pressure (CPAP) 04/02/2013  . 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/02/2013  . Diabetic neuropathy ([place]) 07/23/2012  . Obesity, morbid ([place]) 05/01/2012  . Hypersomnia, persistent 05/01/2012  . OSA on CPAP 04/19/2012  . Essential hypertension, benign 12/16/2009  . CORONARY ATHEROSCLEROSIS NATIVE CORONARY ARTERY 12/16/2009    Past Surgical History:  Procedure Laterality Date  . BACK SURGERY    . CATARACT EXTRACTION Bilateral   . COLONOSCOPY    . CYST REMOVAL TRUNK     from back  . EYE SURGERY Left 2016   laser to left eye  . HIP SURGERY     bone removed from both sides of hip  . KNEE ARTHROTOMY Right  12/04/2014   Procedure: KNEE ARTHROTOMY PATELLA LIGAMENT RECONSTRUSION AND REPAIR RIGHT KNEE;  Surgeon: Paralee Cancel, MD;  Location: [place];  Service: Orthopedics;  Laterality: Right;  . KNEE SURGERY     X 25 TIMES  . LUMBAR DISC SURGERY     Left L3, L4, L5 discecotomy with decompression of L4 root  . TONSILLECTOMY    . TOTAL KNEE ARTHROPLASTY  2003   LEFT  . TOTAL KNEE ARTHROPLASTY Right 03/23/2014   Procedure: RIGHT TOTAL KNEE ARTHROPLASTY AND REMOVAL RIGHT TIBIAL  DEEP IMPLANT STAPLE;  Surgeon: Paralee Cancel, MD;  Location: [facility];  Service: Orthopedics;  Laterality: Right;  . TOTAL KNEE REVISION  2005   LEFT  . WRIST SURGERY          Home Medications     Prior to Admission medications   Medication Sig Start Date End Date Taking? Authorizing Provider  amLODipine (NORVASC) 5 MG tablet TAKE ONE TABLET BY MOUTH ONCE DAILY. 02/05/17   Satira Sark, MD  B-D ULTRAFINE III SHORT PEN 31G X 8 MM MISC USING FIVE TIMES DAILY. 11/06/16   Cassandria Anger, MD  clobetasol (TEMOVATE) 0.05 % external solution Apply 1 application topically daily as needed (irritation.).  11/04/12   [provider]  clonazePAM (KLONOPIN) 1 MG tablet TAKE (1) TABLET BY MOUTH AT BEDTIME FOR RESTLESS LEGS. 02/12/17   Kathrynn Ducking, MD  Continuous Blood Gluc Sensor (FREESTYLE LIBRE SENSOR SYSTEM) MISC Use one sensor every 10 days. 09/25/16   Cassandria Anger, MD  Continuous Blood Gluc Sensor (FREESTYLE LIBRE SENSOR SYSTEM) MISC Use one sensor every 10 days. 01/10/17   Cassandria Anger, MD  fenofibrate 160 MG tablet TAKE (1) TABLET BY MOUTH AT BEDTIME. 09/29/16   [name], MD  furosemide (LASIX) 40 MG tablet Take 40 mg by mouth every other day.  08/31/15   [provider]  HUMULIN R U-500 KWIKPEN 500 UNIT/ML kwikpen INJECT 70 UNITS WITH BREAKFAST, 70 UNITS WITH LUNCH AND 40 UNITS WITHSUPPER IF PRE-MEAL BLOOD SUGAR IS ABOVE 90MG /DL. 02/05/17   Cassandria Anger, MD  isosorbide mononitrate (IMDUR) 60 MG 24 hr tablet TAKE (1/2) TABLET BY MOUTH TWICE DAILY. 01/01/17   Satira Sark, MD  levothyroxine (SYNTHROID, LEVOTHROID) 137 MCG tablet TAKE (1) TABLET BY MOUTH EACH MORNING BEFORE BREAKFAST. 01/01/17   Cassandria Anger, MD  loratadine (CLARITIN) 10 MG tablet Take 10 mg by mouth at bedtime as needed for allergies.     [provider]  losartan (COZAAR) 50 MG tablet Take 50 mg by mouth at bedtime. Reported on 06/18/2015    [provider]  metFORMIN (GLUCOPHAGE) 500 MG tablet TAKE 2 TABLETS EACH MORNING, 1 TABLET AT NOON, AND 2 TABLETS EVERY EVENING. Patient taking differently: 1 tablet twice a day 10/09/16   Kathyrn Drown, MD  metoprolol succinate (TOPROL-XL) 50 MG 24 hr tablet TAKE (1) TABLET BY MOUTH TWICE DAILY. 02/05/17   Satira Sark, MD  modafinil (PROVIGIL) 200 MG tablet TAKE ONE TABLET BY MOUTH ONCE DAILY. 11/21/16   Dohmeier, Asencion Partridge, MD  nitroGLYCERIN (NITROSTAT) 0.4 MG SL tablet DISSOLVE 1 TABLET UNDER TONGUE EVERY 5 MINUTES UP TO 15 MIN FOR CHESTPAIN. IF NO RELIEF CALL 911. 10/09/16   Satira Sark, MD  ONE TOUCH ULTRA TEST test strip TEST BLOOD SUGAR UP TO 4 TIMES DAILY. 10/10/16   Cassandria Anger, MD  ONETOUCH DELICA LANCETS 44W MISC USE AS DIRECTED TO TEST BLOOD SUGAR  4 TIMES DAILY. 10/10/16   Cassandria Anger, MD  pravastatin (PRAVACHOL) 20 MG tablet TAKE (1) TABLET BY MOUTH ONCE DAILY. 11/09/16   [name], MD  rOPINIRole (REQUIP XL) 2 MG 24 hr tablet Take 1 tablet (2 mg total) by mouth at bedtime. 11/29/16   Ward Givens, NP  rOPINIRole (REQUIP) 4 MG tablet TAKE 1/2 TABLET BY MOUTH AT LUNCH AND 1 TABLET BY MOUTH AT BEDTIME. 11/29/16   Ward Givens, NP  sodium chloride (OCEAN) 0.65 % SOLN nasal spray Place 1-2 sprays into both nostrils daily as needed for congestion.    [provider]  Testosterone Cypionate & Prop 200-20 MG/ML SOLN Inject 1 mL into the muscle every 14 (fourteen) days.    [provider]  Vitamin D, Ergocalciferol, (DRISDOL) 50000 units CAPS capsule TAKE 1 CAPSULE BY MOUTH ONCE A WEEK. 03/13/17   Cassandria Anger, MD    Family History Family History  Problem Relation Age of Onset  . Hypertension Father   . Heart attack Father   . Kidney Stones Father   . Seizures Grandchild   . Narcolepsy Grandchild   . Diabetes Sister     Social History Social History   Tobacco Use  . Smoking status: Former Smoker    Types: Cigarettes    Start date: 06/19/1967    Last attempt to quit: 01/03/1995    Years since quitting: 22.2  . Smokeless tobacco: Never Used  Substance Use Topics  . Alcohol use: No    Alcohol/week: 0.0 oz     Comment: quit drinking in 07/86  . Drug use: No     Allergies   Cardizem [diltiazem hcl]; Cardura [doxazosin mesylate]; Lipitor [atorvastatin]; Paxil [paroxetine hcl]; Adhesive [tape]; and Codeine   Review of Systems Review of Systems  Constitutional: Positive for diaphoresis and fatigue.  Respiratory: Positive for shortness of breath.   Cardiovascular: Positive for chest pain.  Gastrointestinal: Negative for abdominal pain.  Neurological: Positive for headaches.  All other systems reviewed and are negative.    Physical Exam Updated Vital Signs BP 133/73 (BP Location: Left Arm)   Pulse 78   Temp 98.5 F (36.9 C) (Oral)   Resp 19   Ht 1.626 m (5\' 4" )   Wt 98.9 kg (218 lb)   SpO2 95%   BMI 37.42 kg/m   Physical Exam  CONSTITUTIONAL: Well developed/well nourished HEAD: Normocephalic/atraumatic EYES: EOMI/PERRL ENMT: Mucous membranes moist NECK: supple no meningeal signs SPINE/BACK:entire spine nontender CV: S1/S2 noted, no murmurs/rubs/gallops noted LUNGS: Lungs are clear to auscultation bilaterally, no apparent distress ABDOMEN: soft, nontender, no rebound or guarding, bowel sounds noted throughout abdomen GU:no cva tenderness NEURO: Pt is awake/alert/appropriate, moves all extremitiesx4.  No facial droop.   EXTREMITIES: pulses normal/equal x4, full ROM SKIN: warm, color normal, psoriasis to forehead PSYCH: no abnormalities of mood noted, alert and oriented to situation  ED Treatments / Results  Labs (all labs ordered are listed, but only abnormal results are displayed) Labs Reviewed  BASIC METABOLIC PANEL - Abnormal; Notable for the following components:      Result Value   BUN 39 (*)    Creatinine, Ser 2.01 (*)    GFR calc non Af Amer 33 (*)    GFR calc Af Amer 38 (*)    All other components within normal limits  CBC - Abnormal; Notable for the following components:   RBC 4.14 (*)    Hemoglobin 12.4 (*)    HCT 38.2 (*)  All other components within  normal limits  CBG MONITORING, ED - Abnormal; Notable for the following components:   Glucose-Capillary 64 (*)    All other components within normal limits  D-DIMER, QUANTITATIVE (NOT AT [facility])  I-STAT TROPONIN, ED  CBG MONITORING, ED  CBG MONITORING, ED  CBG MONITORING, ED  CBG MONITORING, ED    EKG EKG Interpretation  Date/Time:  Thursday March 29 2017 23:22:14 EDT Ventricular Rate:  78 PR Interval:    QRS Duration: 102 QT Interval:  400 QTC Calculation: 456 R Axis:   -10 Text Interpretation:  Sinus rhythm Low voltage, precordial leads No significant change since last tracing Confirmed by Ripley Fraise [phone]) on 03/29/2017 11:43:21 PM   Radiology Dg Chest 2 View  Result Date: 03/30/2017 CLINICAL DATA:  67 year old male with increasing left chest pain and shortness of breath for 1 day. Cough for 1 week. EXAM: CHEST - 2 VIEW COMPARISON:  Chest CTA and radiographs 12/13/2012 and earlier. FINDINGS: Upright AP and lateral views of the chest normal lung volumes. Normal cardiac size and mediastinal contours. Visualized tracheal air column is within normal limits. The lungs are clear. No pneumothorax or pleural effusion. Negative visible bowel gas pattern. No acute osseous abnormality identified. IMPRESSION: No acute cardiopulmonary abnormality. Electronically Signed   By: Genevie Ann M.D.   On: 03/30/2017 00:46    Procedures Procedures    Medications Ordered in ED Medications  sodium chloride 0.9 % bolus 1,000 mL (1,000 mLs Intravenous New Bag/Given 03/30/17 0145)  aspirin chewable tablet 324 mg (324 mg Oral Given 03/30/17 0145)     Initial Impression / Assessment and Plan / ED Course  I have reviewed the triage vital signs and the nursing notes.  Pertinent labs & imaging results that were available during my care of the patient were reviewed by me and considered in my medical decision making (see chart for details).     3:18 AM Pt with CP episodes as well as hypoglycemia For  hypoglycemia,  may be due to worsening renal function.  He has not dropped into the 40s here, has stayed in the 49s and 70s.  He is taking p.o. Juice As for chest pain, initial ACS workup negative.  D-dimer negative. Due to multiple issues at this time, I feel he is appropriate for admission and further workup.  Patient agrees with plan.  Discussed with Dr. Manuella Ghazi for admission  Final Clinical Impressions(s) / ED Diagnoses   Final diagnoses:  AKI (acute kidney injury) [facility])  Hypoglycemia  Chest pain, rule out acute myocardial infarction    ED Discharge Orders    None       Ripley Fraise, MD 03/30/17 [phone]

## 2017-03-30 ENCOUNTER — Observation Stay (HOSPITAL_COMMUNITY): Payer: Medicare Other

## 2017-03-30 ENCOUNTER — Other Ambulatory Visit: Payer: Self-pay

## 2017-03-30 DIAGNOSIS — E162 Hypoglycemia, unspecified: Secondary | ICD-10-CM | POA: Diagnosis not present

## 2017-03-30 DIAGNOSIS — R05 Cough: Secondary | ICD-10-CM | POA: Diagnosis not present

## 2017-03-30 DIAGNOSIS — R0602 Shortness of breath: Secondary | ICD-10-CM | POA: Diagnosis not present

## 2017-03-30 DIAGNOSIS — N179 Acute kidney failure, unspecified: Secondary | ICD-10-CM | POA: Diagnosis not present

## 2017-03-30 DIAGNOSIS — R079 Chest pain, unspecified: Secondary | ICD-10-CM | POA: Diagnosis not present

## 2017-03-30 LAB — GLUCOSE, CAPILLARY
GLUCOSE-CAPILLARY: 184 mg/dL — AB (ref 65–99)
GLUCOSE-CAPILLARY: 307 mg/dL — AB (ref 65–99)
GLUCOSE-CAPILLARY: 443 mg/dL — AB (ref 65–99)
GLUCOSE-CAPILLARY: 286 mg/dL — AB (ref 65–99)
GLUCOSE-CAPILLARY: 251 mg/dL — AB (ref 65–99)
Glucose-Capillary: 87 mg/dL (ref 65–99)
Glucose-Capillary: 413 mg/dL — ABNORMAL HIGH (ref 65–99)
Glucose-Capillary: 287 mg/dL — ABNORMAL HIGH (ref 65–99)
Glucose-Capillary: 243 mg/dL — ABNORMAL HIGH (ref 65–99)

## 2017-03-30 LAB — BASIC METABOLIC PANEL WITH GFR
Anion gap: 12 (ref 5–15)
BUN: 39 mg/dL — ABNORMAL HIGH (ref 6–20)
CO2: 26 mmol/L (ref 22–32)
Calcium: 9.9 mg/dL (ref 8.9–10.3)
Chloride: 101 mmol/L (ref 101–111)
Creatinine, Ser: 2.01 mg/dL — ABNORMAL HIGH (ref 0.61–1.24)
GFR calc Af Amer: 38 mL/min — ABNORMAL LOW
GFR calc non Af Amer: 33 mL/min — ABNORMAL LOW
Glucose, Bld: 71 mg/dL (ref 65–99)
Potassium: 3.6 mmol/L (ref 3.5–5.1)
Sodium: 139 mmol/L (ref 135–145)

## 2017-03-30 LAB — CREATININE, SERUM
CREATININE: 1.71 mg/dL — AB (ref 0.61–1.24)
GFR, EST AFRICAN AMERICAN: 47 mL/min — AB (ref >60)
GFR, EST NON AFRICAN AMERICAN: 40 mL/min — AB (ref >60)

## 2017-03-30 LAB — HEMOGLOBIN A1C
Hgb A1c MFr Bld: 7.8 % — ABNORMAL HIGH (ref 4.8–5.6)
Mean Plasma Glucose: 177.16 mg/dL

## 2017-03-30 LAB — I-STAT TROPONIN, ED: Troponin i, poc: 0 ng/mL (ref 0.00–0.08)

## 2017-03-30 LAB — CBC
HCT: 38.2 % — ABNORMAL LOW (ref 39.0–52.0)
HEMATOCRIT: 35.3 % — AB (ref 39.0–52.0)
Hemoglobin: 11.5 g/dL — ABNORMAL LOW (ref 13.0–17.0)
Hemoglobin: 12.4 g/dL — ABNORMAL LOW (ref 13.0–17.0)
MCH: 30 pg (ref 26.0–34.0)
MCH: 30.3 pg (ref 26.0–34.0)
MCHC: 32.5 g/dL (ref 30.0–36.0)
MCHC: 32.6 g/dL (ref 30.0–36.0)
MCV: 92.3 fL (ref 78.0–100.0)
MCV: 93.1 fL (ref 78.0–100.0)
PLATELETS: 216 10*3/uL (ref 150–400)
Platelets: 245 K/uL (ref 150–400)
RBC: 3.79 MIL/uL — ABNORMAL LOW (ref 4.22–5.81)
RBC: 4.14 MIL/uL — ABNORMAL LOW (ref 4.22–5.81)
RDW: 13.6 % (ref 11.5–15.5)
RDW: 13.6 % (ref 11.5–15.5)
WBC: 7.4 10*3/uL (ref 4.0–10.5)
WBC: 8 K/uL (ref 4.0–10.5)

## 2017-03-30 LAB — TROPONIN I
Troponin I: <0.03 ng/mL (ref <0.03)
Troponin I: <0.03 ng/mL (ref <0.03)
Troponin I: <0.03 ng/mL (ref <0.03)

## 2017-03-30 LAB — CBG MONITORING, ED: Glucose-Capillary: 64 mg/dL — ABNORMAL LOW (ref 65–99)

## 2017-03-30 LAB — D-DIMER, QUANTITATIVE (NOT AT ARMC): D DIMER QUANT: 0.33 ug{FEU}/mL (ref 0.00–0.50)

## 2017-03-30 LAB — MRSA PCR SCREENING: MRSA BY PCR: NEGATIVE

## 2017-03-30 MED ORDER — NITROGLYCERIN 0.3 MG SL SUBL
0.3000 mg | SUBLINGUAL_TABLET | SUBLINGUAL | Status: DC | PRN
  Filled 2017-03-30: qty 100

## 2017-03-30 MED ORDER — LEVOTHYROXINE SODIUM 137 MCG PO TABS
137.0000 ug | ORAL_TABLET | Freq: Every day | ORAL | Status: DC
Start: 2017-03-30 — End: 2017-03-31
  Administered 2017-03-30 – 2017-03-31 (×2): 137 ug via ORAL
  Filled 2017-03-30 (×7): qty 1

## 2017-03-30 MED ORDER — NITROGLYCERIN 0.4 MG SL SUBL: 0.4000 mg | SUBLINGUAL_TABLET | SUBLINGUAL | Status: DC | PRN

## 2017-03-30 MED ORDER — SODIUM CHLORIDE 0.9 % IV SOLN
INTRAVENOUS | Status: DC
  Administered 2017-03-30 – 2017-03-31 (×2): via INTRAVENOUS

## 2017-03-30 MED ORDER — PRAVASTATIN SODIUM 10 MG PO TABS
20.0000 mg | ORAL_TABLET | Freq: Every day | ORAL | Status: DC
  Administered 2017-03-30: 20 mg via ORAL
  Filled 2017-03-30 (×3): qty 1
  Filled 2017-03-30: qty 2

## 2017-03-30 MED ORDER — TRAMADOL HCL 50 MG PO TABS
50.0000 mg | ORAL_TABLET | Freq: Once | ORAL | Status: AC
  Administered 2017-03-30: 50 mg via ORAL
  Filled 2017-03-30: qty 1

## 2017-03-30 MED ORDER — ROPINIROLE HCL 1 MG PO TABS
2.0000 mg | ORAL_TABLET | Freq: Every day | ORAL | Status: DC
  Filled 2017-03-30: qty 2

## 2017-03-30 MED ORDER — MODAFINIL 200 MG PO TABS
200.0000 mg | ORAL_TABLET | Freq: Every day | ORAL | Status: DC
  Administered 2017-03-30 – 2017-03-31 (×2): 200 mg via ORAL
  Filled 2017-03-30 (×2): qty 1

## 2017-03-30 MED ORDER — FENOFIBRATE 160 MG PO TABS
160.0000 mg | ORAL_TABLET | Freq: Every day | ORAL | Status: DC
  Administered 2017-03-30: 160 mg via ORAL
  Filled 2017-03-30: qty 1

## 2017-03-30 MED ORDER — INSULIN ASPART 100 UNIT/ML ~~LOC~~ SOLN
0.0000 [IU] | SUBCUTANEOUS | Status: DC
  Administered 2017-03-30 (×2): 15 [IU] via SUBCUTANEOUS
  Administered 2017-03-31: 3 [IU] via SUBCUTANEOUS
  Administered 2017-03-31: 8 [IU] via SUBCUTANEOUS
  Administered 2017-03-31: 3 [IU] via SUBCUTANEOUS

## 2017-03-30 MED ORDER — ACETAMINOPHEN 325 MG PO TABS
650.0000 mg | ORAL_TABLET | Freq: Four times a day (QID) | ORAL | Status: DC | PRN
  Administered 2017-03-30 (×2): 650 mg via ORAL
  Filled 2017-03-30 (×2): qty 2

## 2017-03-30 MED ORDER — ISOSORBIDE MONONITRATE ER 60 MG PO TB24
60.0000 mg | ORAL_TABLET | Freq: Every day | ORAL | Status: DC
  Administered 2017-03-30 – 2017-03-31 (×2): 60 mg via ORAL
  Filled 2017-03-30 (×2): qty 1

## 2017-03-30 MED ORDER — ACETAMINOPHEN 650 MG RE SUPP: 650.0000 mg | Freq: Four times a day (QID) | RECTAL | Status: DC | PRN

## 2017-03-30 MED ORDER — HEPARIN SODIUM (PORCINE) 5000 UNIT/ML IJ SOLN
5000.0000 [IU] | Freq: Three times a day (TID) | INTRAMUSCULAR | Status: DC
  Administered 2017-03-30 – 2017-03-31 (×4): 5000 [IU] via SUBCUTANEOUS
  Filled 2017-03-30 (×4): qty 1

## 2017-03-30 MED ORDER — ROPINIROLE HCL 1 MG PO TABS
2.0000 mg | ORAL_TABLET | Freq: Every morning | ORAL | Status: DC
  Administered 2017-03-31: 2 mg via ORAL
  Filled 2017-03-30: qty 2

## 2017-03-30 MED ORDER — ROPINIROLE HCL 1 MG PO TABS
4.0000 mg | ORAL_TABLET | Freq: Every day | ORAL | Status: DC
  Filled 2017-03-30: qty 4

## 2017-03-30 MED ORDER — SODIUM CHLORIDE 0.9 % IV BOLUS
1000.0000 mL | Freq: Once | INTRAVENOUS | Status: AC
  Administered 2017-03-30: 1000 mL via INTRAVENOUS

## 2017-03-30 MED ORDER — ASPIRIN 81 MG PO CHEW
324.0000 mg | CHEWABLE_TABLET | Freq: Once | ORAL | Status: AC
  Administered 2017-03-30: 324 mg via ORAL
  Filled 2017-03-30: qty 4

## 2017-03-30 MED ORDER — ROPINIROLE HCL 1 MG PO TABS
4.0000 mg | ORAL_TABLET | Freq: Every day | ORAL | Status: DC
  Administered 2017-03-30 (×2): 4 mg via ORAL
  Filled 2017-03-30: qty 4

## 2017-03-30 MED ORDER — ONDANSETRON HCL 4 MG PO TABS: 4.0000 mg | ORAL_TABLET | Freq: Four times a day (QID) | ORAL | Status: DC | PRN

## 2017-03-30 MED ORDER — ROPINIROLE HCL ER 8 MG PO TB24: 8.0000 mg | ORAL_TABLET | Freq: Every day | ORAL | Status: DC

## 2017-03-30 MED ORDER — ONDANSETRON HCL 4 MG/2ML IJ SOLN: 4.0000 mg | Freq: Four times a day (QID) | INTRAMUSCULAR | Status: DC | PRN

## 2017-03-30 MED ORDER — AMLODIPINE BESYLATE 5 MG PO TABS
5.0000 mg | ORAL_TABLET | Freq: Every day | ORAL | Status: DC
  Administered 2017-03-30 – 2017-03-31 (×2): 5 mg via ORAL
  Filled 2017-03-30 (×2): qty 1

## 2017-03-30 MED ORDER — DEXTROSE-NACL 5-0.45 % IV SOLN
INTRAVENOUS | Status: DC
  Administered 2017-03-30: 05:00:00 via INTRAVENOUS

## 2017-03-30 NOTE — Care Management Obs Status (Signed)
[facility] NOTIFICATION   Patient Details  Name: Paul Baker MRN: [id number] Date of Birth: 02-27-1950   Medicare Observation Status Notification Given:  Yes    Aakash Hollomon, Chauncey Reading, RN 03/30/2017, 8:54 AM

## 2017-03-30 NOTE — Progress Notes (Addendum)
Inpatient Diabetes Program Recommendations  AACE/ADA: New Consensus Statement on Inpatient Glycemic Control (2015)  Target Ranges:  Prepandial:   less than 140 mg/dL      Peak postprandial:   less than 180 mg/dL (1-2 hours)      Critically ill patients:  140 - 180 mg/dL   Results for KRU, ALLMAN (MRN [id number]) as of 03/30/2017 09:15  Ref. Range 03/29/2017 23:18 03/30/2017 01:01 03/30/2017 06:32  Glucose-Capillary Latest Ref Range: 65 - 99 mg/dL 77 64 (L) 87  Results for JIGAR, ZIELKE (MRN [id number]) as of 03/30/2017 09:15  Ref. Range 03/29/2017 23:57  Glucose Latest Ref Range: 65 - 99 mg/dL 71  Results for ARDITH, LEWMAN (MRN [id number]) as of 03/30/2017 09:15  Ref. Range 12/21/2016 08:41  Hemoglobin A1C Latest Ref Range: <5.7 % of total Hgb 9.1 (H)   Review of Glycemic Control  Diabetes history: DM2 Outpatient Diabetes medications: Humulin R U500 (concentrated insulin) 45 units with breakfast, 45 units with lunch, and 35 units with supper if glucose is greater than 90 mg/dl, Metformin 500 mg BID Current orders for Inpatient glycemic control: CBG monitoring  Inpatient Diabetes Program Recommendations:  Insulin - Basal: Once glucose is consistently greater than 180 mg/dl (with Novolog correction scale), will need to order basal insulin.  Correction (SSI): Once glucose is consistently greater than 150 mg/dl without any further hypoglycemia, please consider ordering CBGs with Novolog 0-15 units Q4H.  HgbA1C: Last A1C in chart was 9.1% on 12/21/16. Current A1C in process.  NOTE: Patient has DM2 and takes Humulin R U500 (concentrated insulin) outpatient for DM control. Patient is followed by Dr. [name] (Endocrinologist) and was last seen 01/10/17. Patient admitted with severe hypoglycemia likely from acute kidney injury and Humulin R U500 insulin. Humulin R U500 has an onset of 30-45 minutes, peak of 2-4 hours, and duration 8-24 hours.  Will plan to talk to patient today.  Addendum  03/30/17@12 :45-Spoke with patient about diabetes and home regimen for diabetes control. Patient reports that he is followed by Dr. [name] for diabetes management and currently he takes Humulin R U500 45 units with breakfast, 45 units with lunch, and 35 units with supper, and Metformin 500 mg BID as an outpatient for diabetes control. Patient reports that he has been decreasing Humulin R U500 dosages for several weeks due to hypoglycemia. Patient last took Humulin R U500 with lunch on 03/29/17. Patient reports he ate lunch but glucose was persistently low despite eating cookies and drinking sugary beverage for hypoglycemia treatment. Patient last seen Dr. [name] on 01/10/2017 and Humulin R U500 dosages were decreased some at that time and patient states he was instructed to continue to adjust insulin dosages based on how his glucose was trending. Patient reports that he has been using the FreeStyle Libre (flash glucose monitoring sensor) for about 1 month now and he currently has the sensor on his right arm.  Patient's wife has the reader device and scanned his arm while I was in the room and it read glucose as 298 mg/dl. Patient likes the [facility] but feels he would benefit from the Dexcom (continuious glucose monitoring sensor) because it reads the glucose every 5 minutes and will alarm if glucose is too low or high.  FreeStyle Libre sensor only reads the glucose when scanned with the reader device and will provide a graph of glucose trends but not actual values between scans; also FreeStyle Elenor Legato has no alarms to alert of low or high glucose values. Patient  states that his insurance would not cover Dexcom sensor months ago when he checked on getting it. Encouraged patient to talk with Dr. [name] again about the Dexcom to see if he could help him get the [facility] sensor. Patient reports that his last A1C was higher at 9.1% in December. He notes that he has gotten steroid injections within the 3 months prior to his A1C  being drawn.  Patient and his wife are very concerned about using Humulin R U500 due to recurrent hypoglycemia. Patient and his wife report that he has hypoglycemia during the night frequently and his wife is able to tell and has to wake him up from sleep. Informed patient the the information we discussed would be reported to Dr. Clementeen Graham and that Dr. Clementeen Graham may want to reach out to Dr. [name] for his recommendations on insulin changes. Patient verbalized understanding of information discussed and he states that he has no further questions at this time related to diabetes. Talked with Dr. Clementeen Graham regarding conversation with patient and he plans to contact Dr. [name] for recommendations on insulin changes. Informed patient that Dr. Clementeen Graham was planning to contact Dr. [name]. Also talked with Larene Beach, RN regarding current order for Novolog correction and to discontinue IV fluids with D5. Asked that glucose be rechecked and Novolog correction be given as ordered now.  Thanks, Barnie Alderman, RN, MSN, CDE Diabetes Coordinator Inpatient Diabetes Program [phone] (Team Pager from 8am to 5pm)

## 2017-03-30 NOTE — Plan of Care (Signed)
progressing 

## 2017-03-30 NOTE — Progress Notes (Signed)
Notified Dr. Clementeen Graham that Paul Baker's CBG was 443 at 1453 and that I gave the Paul Baker. 15 units of novolog. The Paul Baker's CBG was then 413 at 1652.. I gave the Paul Baker 15 units and then rechecked CBG at 1801 and it was 287.

## 2017-03-30 NOTE — ED Notes (Signed)
Pt BGL checked 64 mg/dL, given 8oz orange juice orally

## 2017-03-30 NOTE — H&P (Addendum)
History and Physical    Paul Baker:[id number] DOB: 08/02/1950 DOA: 03/29/2017  PCP: [name], MD   Patient coming from: Home  Chief Complaint: Severe hypoglycemia  HPI: Paul Baker is a 67 y.o. male with medical history significant for insulin-dependent type 2 diabetes, hypertension, dyslipidemia, CAD, hypothyroidism, obstructive sleep apnea with CPAP, and restless leg syndrome, who presents to the emergency department with 2-3 days of persistent hypoglycemic readings on home device with symptoms of facial numbness, shakiness, and diaphoresis.  He has noted his blood glucose readings as low as 30-40mg /dL when he is symptomatic and despite having eaten Honeybuns, cookies, and other sugary items, has not noted much of an improvement in his blood glucose readings.  He is somewhat confused and feels "foggy."  He has had some sharp chest pain with radiation into his left arm during these episodes.  This is nonexertional and is not associated with any nausea, vomiting, or dyspnea.  The recent illness and overall has maintained good oral intake of solids and liquids. He complains of back pain and recently has received an epidural steroid injection.  His prior A1c reading was noted to be 9.1% and his average blood glucose runs greater than 200mg /dL.  He has been trying to cut back on his home insulin use over the last couple days with no improvement in his blood glucose readings noted.   ED Course: Vital signs are noted to be stable.  Laboratory data remarkable for creatinine 2.01 with baseline of 1.38.  Glucose was noted to be 71mg /dL.  On repeat Accu-Chek he was noted to be 64mg /dL and was given some orange juice.  He has been given 1 L of IV fluid and full dose aspirin.  Two-view chest x-ray with no acute findings.  EKG sinus rhythm at 78 bpm with no acute findings.  He is alert, awake with no current symptomatology.  Wife is currently at the bedside to assist with  history.  Review of Systems: All others reviewed and otherwise negative.  Past Medical History:  Diagnosis Date  . Arthritis   . Asthma   . Colon polyp   . Coronary atherosclerosis of native coronary artery    Diseased nondominant RCA  . DVT (deep venous thrombosis) ([place]) 2005   Right arm  . Essential hypertension, benign   . Headache   . History of transfusion   . Hypersomnia    CPAP of 16 cm, diagnosed with AHI of 60 in 2012,epworth 21- narcolepsy?  [name] Hypothyroidism   . Mixed hyperlipidemia   . Morbid obesity ([place])   . MRSA (methicillin resistant staph aureus) culture positive    08/2012  . Narcolepsy   . OSA (obstructive sleep apnea)   . Pneumonia   . Psoriasis   . Pulmonary embolism ([place]) 2004  . RLS (restless legs syndrome)   . Rotator cuff disorder    Left  . Septic arthritis of knee, left ([place])   . Skin cancer, basal cell   . Type 2 diabetes mellitus ([place])     Past Surgical History:  Procedure Laterality Date  . BACK SURGERY    . CATARACT EXTRACTION Bilateral   . COLONOSCOPY    . CYST REMOVAL TRUNK     from back  . EYE SURGERY Left 2016   laser to left eye  . HIP SURGERY     bone removed from both sides of hip  . KNEE ARTHROTOMY Right 12/04/2014   Procedure: KNEE ARTHROTOMY PATELLA LIGAMENT RECONSTRUSION  AND REPAIR RIGHT KNEE;  Surgeon: Paralee Cancel, MD;  Location: [facility];  Service: Orthopedics;  Laterality: Right;  . KNEE SURGERY     X 25 TIMES  . LUMBAR DISC SURGERY     Left L3, L4, L5 discecotomy with decompression of L4 root  . TONSILLECTOMY    . TOTAL KNEE ARTHROPLASTY  2003   LEFT  . TOTAL KNEE ARTHROPLASTY Right 03/23/2014   Procedure: RIGHT TOTAL KNEE ARTHROPLASTY AND REMOVAL RIGHT TIBIAL  DEEP IMPLANT STAPLE;  Surgeon: Paralee Cancel, MD;  Location: [facility];  Service: Orthopedics;  Laterality: Right;  . TOTAL KNEE REVISION  2005   LEFT  . WRIST SURGERY       reports that he quit smoking about 22 years ago. His smoking use included cigarettes. He  started smoking about 49 years ago. He has never used smokeless tobacco. He reports that he does not drink alcohol or use drugs.  Allergies  Allergen Reactions  . Cardizem [Diltiazem Hcl]     Edema   . Cardura [Doxazosin Mesylate]     Side effects  . Lipitor [Atorvastatin] Other (See Comments)    Leg cramps  . Paxil [Paroxetine Hcl]     Side effects   . Adhesive [Tape] Rash  . Codeine Rash and Other (See Comments)    Headache     Family History  Problem Relation Age of Onset  . Hypertension Father   . Heart attack Father   . Kidney Stones Father   . Seizures Grandchild   . Narcolepsy Grandchild   . Diabetes Sister     Prior to Admission medications   Medication Sig Start Date End Date Taking? Authorizing Provider  amLODipine (NORVASC) 5 MG tablet TAKE ONE TABLET BY MOUTH ONCE DAILY. 02/05/17   Satira Sark, MD  B-D ULTRAFINE III SHORT PEN 31G X 8 MM MISC USING FIVE TIMES DAILY. 11/06/16   Cassandria Anger, MD  clobetasol (TEMOVATE) 0.05 % external solution Apply 1 application topically daily as needed (irritation.).  11/04/12   [provider]  clonazePAM (KLONOPIN) 1 MG tablet TAKE (1) TABLET BY MOUTH AT BEDTIME FOR RESTLESS LEGS. 02/12/17   Kathrynn Ducking, MD  Continuous Blood Gluc Sensor (FREESTYLE LIBRE SENSOR SYSTEM) MISC Use one sensor every 10 days. 09/25/16   Cassandria Anger, MD  Continuous Blood Gluc Sensor (FREESTYLE LIBRE SENSOR SYSTEM) MISC Use one sensor every 10 days. 01/10/17   Cassandria Anger, MD  fenofibrate 160 MG tablet TAKE (1) TABLET BY MOUTH AT BEDTIME. 09/29/16   [name], MD  furosemide (LASIX) 40 MG tablet Take 40 mg by mouth every other day.  08/31/15   [provider]  HUMULIN R U-500 KWIKPEN 500 UNIT/ML kwikpen INJECT 70 UNITS WITH BREAKFAST, 70 UNITS WITH LUNCH AND 40 UNITS WITHSUPPER IF PRE-MEAL BLOOD SUGAR IS ABOVE 90MG /DL. 02/05/17   Cassandria Anger, MD  isosorbide mononitrate (IMDUR) 60 MG 24 hr  tablet TAKE (1/2) TABLET BY MOUTH TWICE DAILY. 01/01/17   Satira Sark, MD  levothyroxine (SYNTHROID, LEVOTHROID) 137 MCG tablet TAKE (1) TABLET BY MOUTH EACH MORNING BEFORE BREAKFAST. 01/01/17   Cassandria Anger, MD  loratadine (CLARITIN) 10 MG tablet Take 10 mg by mouth at bedtime as needed for allergies.     [provider]  losartan (COZAAR) 50 MG tablet Take 50 mg by mouth at bedtime. Reported on 06/18/2015    [provider]  metFORMIN (GLUCOPHAGE) 500 MG tablet TAKE 2 TABLETS  EACH MORNING, 1 TABLET AT NOON, AND 2 TABLETS EVERY EVENING. Patient taking differently: 1 tablet twice a day 10/09/16   Kathyrn Drown, MD  metoprolol succinate (TOPROL-XL) 50 MG 24 hr tablet TAKE (1) TABLET BY MOUTH TWICE DAILY. 02/05/17   Satira Sark, MD  modafinil (PROVIGIL) 200 MG tablet TAKE ONE TABLET BY MOUTH ONCE DAILY. 11/21/16   Dohmeier, Asencion Partridge, MD  nitroGLYCERIN (NITROSTAT) 0.4 MG SL tablet DISSOLVE 1 TABLET UNDER TONGUE EVERY 5 MINUTES UP TO 15 MIN FOR CHESTPAIN. IF NO RELIEF CALL 911. 10/09/16   Satira Sark, MD  ONE TOUCH ULTRA TEST test strip TEST BLOOD SUGAR UP TO 4 TIMES DAILY. 10/10/16   Cassandria Anger, MD  ONETOUCH DELICA LANCETS 16W MISC USE AS DIRECTED TO TEST BLOOD SUGAR 4 TIMES DAILY. 10/10/16   Cassandria Anger, MD  pravastatin (PRAVACHOL) 20 MG tablet TAKE (1) TABLET BY MOUTH ONCE DAILY. 11/09/16   [name], MD  rOPINIRole (REQUIP XL) 2 MG 24 hr tablet Take 1 tablet (2 mg total) by mouth at bedtime. 11/29/16   Ward Givens, NP  rOPINIRole (REQUIP) 4 MG tablet TAKE 1/2 TABLET BY MOUTH AT LUNCH AND 1 TABLET BY MOUTH AT BEDTIME. 11/29/16   Ward Givens, NP  sodium chloride (OCEAN) 0.65 % SOLN nasal spray Place 1-2 sprays into both nostrils daily as needed for congestion.    [provider]  Testosterone Cypionate & Prop 200-20 MG/ML SOLN Inject 1 mL into the muscle every 14 (fourteen) days.    [provider]  Vitamin  D, Ergocalciferol, (DRISDOL) 50000 units CAPS capsule TAKE 1 CAPSULE BY MOUTH ONCE A WEEK. 03/13/17   Cassandria Anger, MD    Physical Exam: Vitals:   03/30/17 0245 03/30/17 0300 03/30/17 0315 03/30/17 0330  BP: 111/63 117/62 (!) 109/57 118/66  Pulse: 70 76 67 71  Resp:      Temp:      TempSrc:      SpO2: 95% 96% 96% 97%  Weight:      Height:        Constitutional: NAD, calm, comfortable Vitals:   03/30/17 0245 03/30/17 0300 03/30/17 0315 03/30/17 0330  BP: 111/63 117/62 (!) 109/57 118/66  Pulse: 70 76 67 71  Resp:      Temp:      TempSrc:      SpO2: 95% 96% 96% 97%  Weight:      Height:       Eyes: lids and conjunctivae normal ENMT: Mucous membranes are moist.  Neck: normal, supple Respiratory: clear to auscultation bilaterally. Normal respiratory effort. No accessory muscle use.  Cardiovascular: Regular rate and rhythm, no murmurs. No extremity edema. Abdomen: no tenderness, no distention. Bowel sounds positive.  Musculoskeletal:  No joint deformity upper and lower extremities.   Skin: no rashes, lesions, ulcers.  Psychiatric: Normal judgment and insight. Alert and oriented x 3. Normal mood.   Labs on Admission: I have personally reviewed following labs and imaging studies  CBC: Recent Labs  Lab 03/29/17 2357  WBC 8.0  HGB 12.4*  HCT 38.2*  MCV 92.3  PLT 737   Basic Metabolic Panel: Recent Labs  Lab 03/29/17 2357  NA 139  K 3.6  CL 101  CO2 26  GLUCOSE 71  BUN 39*  CREATININE 2.01*  CALCIUM 9.9   GFR: Estimated Creatinine Clearance: 38.9 mL/min (A) (by C-G formula based on SCr of 2.01 mg/dL (H)). Liver Function Tests: No results for input(s): AST,  ALT, ALKPHOS, BILITOT, PROT, ALBUMIN in the last 168 hours. No results for input(s): LIPASE, AMYLASE in the last 168 hours. No results for input(s): AMMONIA in the last 168 hours. Coagulation Profile: No results for input(s): INR, PROTIME in the last 168 hours. Cardiac Enzymes: No results for  input(s): CKTOTAL, CKMB, CKMBINDEX, TROPONINI in the last 168 hours. BNP (last 3 results) No results for input(s): PROBNP in the last 8760 hours. HbA1C: No results for input(s): HGBA1C in the last 72 hours. CBG: Recent Labs  Lab 03/29/17 2318 03/30/17 0101  GLUCAP 77 64*   Lipid Profile: No results for input(s): CHOL, HDL, LDLCALC, TRIG, CHOLHDL, LDLDIRECT in the last 72 hours. Thyroid Function Tests: No results for input(s): TSH, T4TOTAL, FREET4, T3FREE, THYROIDAB in the last 72 hours. Anemia Panel: No results for input(s): VITAMINB12, FOLATE, FERRITIN, TIBC, IRON, RETICCTPCT in the last 72 hours. Urine analysis:    Component Value Date/Time   COLORURINE YELLOW 12/03/2014 1108   APPEARANCEUR CLEAR 12/03/2014 1108   LABSPEC 1.019 12/03/2014 1108   PHURINE 6.0 12/03/2014 1108   GLUCOSEU >1000 (A) 12/03/2014 1108   HGBUR NEGATIVE 12/03/2014 1108   BILIRUBINUR NEGATIVE 12/03/2014 1108   KETONESUR NEGATIVE 12/03/2014 1108   PROTEINUR NEGATIVE 12/03/2014 1108   UROBILINOGEN 0.2 03/13/2014 1149   NITRITE NEGATIVE 12/03/2014 1108   LEUKOCYTESUR NEGATIVE 12/03/2014 1108    Radiological Exams on Admission: Dg Chest 2 View  Result Date: 03/30/2017 CLINICAL DATA:  67 year old male with increasing left chest pain and shortness of breath for 1 day. Cough for 1 week. EXAM: CHEST - 2 VIEW COMPARISON:  Chest CTA and radiographs 12/13/2012 and earlier. FINDINGS: Upright AP and lateral views of the chest normal lung volumes. Normal cardiac size and mediastinal contours. Visualized tracheal air column is within normal limits. The lungs are clear. No pneumothorax or pleural effusion. Negative visible bowel gas pattern. No acute osseous abnormality identified. IMPRESSION: No acute cardiopulmonary abnormality. Electronically Signed   By: Genevie Ann M.D.   On: 03/30/2017 00:46    EKG: Independently reviewed.  Sinus rhythm at 78 bpm with low voltage.  Assessment/Plan Principal Problem:    Hypoglycemia Active Problems:   Essential hypertension, benign   OSA on CPAP   Chest pain   Obesity, morbid (HCC)   DM type 2 causing vascular disease ([place])   Mixed hyperlipidemia   Other specified hypothyroidism   AKI (acute kidney injury) ([place])    1. Persistent hypoglycemia in insulin-dependent type 2 diabetic.  Patient appears to be very knowledgeable and compliant with his current insulin use and does not appear to have any surreptitious use.  I believe this is likely related to AK I and therefore, poor clearance of insulin.  Hold all hypoglycemic agents.  Continue to monitor Accu-Cheks every two hours with treatment of hypoglycemia oral or IV as needed.  Diabetic educator.  A1c.  C-peptide. Regular diet for now with D5 1/2NS gentle IVF. Hold B-blocker. 2. AK I.  Prior baseline creatinine of 1.38 and he does appear to have some component of CKD.  Continue on gentle IV fluid and monitor I's and O's as well as daily weights.  Renal ultrasound and urine electrolytes for further evaluation.  He appears to have good urine volume and is staying hydrated.  Avoid nephrotoxic agents. 3. Chest pain-intermittent.  This appears to be related to his hypoglycemia and appears nonischemic.  No signs of ACS.  Monitor repeat troponins and maintain on telemetry. 4. Hypertension.  Continue home medications with  avoidance of losartan and Lasix. 5. Hypothyroidism.  Continue Synthroid. 6. Restless leg syndrome.  Continue Requip.   DVT prophylaxis: Heparin  Code Status: Full Family Communication: Wife at bedside Disposition Plan: Hold all hypoglycemic agents and monitor for improvement.  Workup of AK I. Consults called: Diabetic educator Admission status: Observation, telemetry   Arne Schlender Darleen Crocker DO [facility] Pager [phone]  If 7PM-7AM, please contact night-coverage [email or web address] Password [facility]  03/30/2017, 3:42 AM

## 2017-03-30 NOTE — Progress Notes (Signed)
PROGRESS NOTE                                                                                                                                                                                                             Patient Demographics:    Paul Baker, is a 67 y.o. male, DOB - 1950/06/21, ACZ:[id number]  Admit date - 03/29/2017   Admitting Physician Pratik Darleen Crocker, DO  Outpatient Primary MD for the patient is Wolfgang Phoenix, Grace Bushy, MD  LOS - 0  Outpatient Specialists: Dr [name] (endocrinology)  Chief Complaint  Patient presents with  . Hypoglycemia       Brief Narrative   67 year old male with uncontrolled insulin-dependent type 2 diabetes mellitus, morbid obesity, CAD, hypothyroidism, OSA on CPAP, restless leg syndrome, hypertension and hyperlipidemia presented to the ED with almost 2 weeks of off-and-on hypoglycemic readings on his home device.  For past 2 days he was having some facial numbness with tremulousness and diaphoresis as well.  He noticed that his blood glucose readings were as low as the 30s and still has symptoms despite being liberal with his diet (has been taking honey buns, cookies and pasta).  He also had some confusion and complained of some sharp left-sided chest pain during these episodes. He informs that his blood glucose was not mostly stable prior to 2 weeks ago.  Denies any recent illness, sick contact or travel.  Denies any new medication. His A1c is in the past have been mostly in the range of 8-9.  He was seen by his endocrinologist 2 months backand has been continued on U 500.  He was on 70 units with breakfast 70 units with lunch and 40 units with supper but because of his low sugars he was taking 50/50/35 units despite which his sugars were still low. In the ED vitals were stable.  He was found to have acute on chronic kidney injury with creatinine of 2 (baseline 1.4).  Blood glucose was 71 and  on repeat Accu-Chek it was 64.  He was given 1 L IV fluids and some orange juice.  Chest x-ray and EKG were unremarkable.  Patient admitted for further management.    Subjective:   Complains of headache.  Off and on left-sided chest discomfort as well.  CBG has been stable and elevated today.   Assessment  &  Plan :    Principal Problem: Recurrent hypoglycemia in uncontrolled type 2 diabetes mellitus Unclear what is contributing to his recurrent symptoms.  I suspect acute kidney injury could be contributing to delayed clearance of insulin from the system. Patient's home insulin and metformin has been held.  Monitor closely for hypoglycemic symptoms. Monitor on sliding scale with every 4 hour CBG monitoring. -Check C-peptide.  I have called his endocrinologist and left a message. -A1c of 7.8 and has improved from 2 months back.  Active Problems: Acute kidney injury on chronic kidney disease stage III. Possibly prerenal.  Improving with IV fluids.  Switch to D5 half NS to normal saline at 100 cc/h.  Renal ultrasound unremarkable.  Discontinued Lasix, ARB and metformin.  Patient follows with Dr. Moshe Cipro who recently reduce his losartan dose.  Chest pain  Appears atypical and could be related to hypoglycemia. EKG and serial troponins have been negative.    Essential hypertension, benign Stable.  Holding Lasix, ARB for acute on chronic kidney disease.  Continue amlodipine and Imdur.     OSA on CPAP    Obesity, morbid ([place]) Needs counseling on weight loss and exercise.  DM type 2 causing vascular disease ([place]) As above.  Continue Requip.  Mixed hyperlipidemia Continue statin.      Other specified hypothyroidism Taking Synthroid.  TSH normal.     Code Status : Full code  Family Communication  : Wife at bedside  Disposition Plan  : Home once improved , in 1-2 days  Barriers For Discharge : Active symptoms  Consults  : none  Procedures  : None  DVT Prophylaxis  :  - Heparin -   Lab Results  Component Value Date   PLT 216 03/30/2017    Antibiotics  :   Anti-infectives (From admission, onward)   None        Objective:   Vitals:   03/30/17 0425 03/30/17 0441 03/30/17 0500 03/30/17 1300  BP: 110/71 (!) 120/52  114/61  Pulse: 71 78  73  Resp:    18  Temp:  98.4 F (36.9 C)  98.6 F (37 C)  TempSrc:  Oral  Oral  SpO2: 95% 97%    Weight:   98.9 kg (218 lb)   Height:        Wt Readings from Last 3 Encounters:  03/30/17 98.9 kg (218 lb)  01/10/17 98.9 kg (218 lb)  11/29/16 100.2 kg (221 lb)     Intake/Output Summary (Last 24 hours) at 03/30/2017 1618 Last data filed at 03/30/2017 1200 Gross per 24 hour  Intake 1240 ml  Output 800 ml  Net 440 ml     Physical Exam  Gen: Morbidly obese male not in distress HEENT: no pallor, moist mucosa, supple neck Chest: clear b/l, no added sounds CVS: N S1&S2, no murmurs,  GI: soft, NT, ND, BS+ Musculoskeletal: warm, no edema     Data Review:    CBC Recent Labs  Lab 03/29/17 2357 03/30/17 0421  WBC 8.0 7.4  HGB 12.4* 11.5*  HCT 38.2* 35.3*  PLT 245 216  MCV 92.3 93.1  MCH 30.0 30.3  MCHC 32.5 32.6  RDW 13.6 13.6    Chemistries  Recent Labs  Lab 03/29/17 2357 03/30/17 0421  NA 139  --   K 3.6  --   CL 101  --   CO2 26  --   GLUCOSE 71  --   BUN 39*  --   CREATININE 2.01* 1.71*  CALCIUM 9.9  --    ------------------------------------------------------------------------------------------------------------------ No results for input(s): CHOL, HDL, LDLCALC, TRIG, CHOLHDL, LDLDIRECT in the last 72 hours.  Lab Results  Component Value Date   HGBA1C 7.8 (H) 03/30/2017   ------------------------------------------------------------------------------------------------------------------ No results for input(s): TSH, T4TOTAL, T3FREE, THYROIDAB in the last 72 hours.  Invalid input(s):  FREET3 ------------------------------------------------------------------------------------------------------------------ No results for input(s): VITAMINB12, FOLATE, FERRITIN, TIBC, IRON, RETICCTPCT in the last 72 hours.  Coagulation profile No results for input(s): INR, PROTIME in the last 168 hours.  Recent Labs    03/29/17 2357  DDIMER 0.33    Cardiac Enzymes Recent Labs  Lab 03/30/17 0421 03/30/17 0955  TROPONINI <0.03 <0.03   ------------------------------------------------------------------------------------------------------------------ No results found for: BNP  Inpatient Medications  Scheduled Meds: . amLODipine  5 mg Oral Daily  . fenofibrate  160 mg Oral QHS  . heparin  5,000 Units Subcutaneous Q8H  . insulin aspart  0-15 Units Subcutaneous Q4H  . isosorbide mononitrate  60 mg Oral Daily  . levothyroxine  137 mcg Oral QAC breakfast  . modafinil  200 mg Oral Daily  . pravastatin  20 mg Oral q1800  . [START ON 03/31/2017] rOPINIRole  2 mg Oral q morning - 10a  . rOPINIRole  4 mg Oral QHS  . traMADol  50 mg Oral Once   Continuous Infusions: . sodium chloride 100 mL/hr at 03/30/17 1607   PRN Meds:.acetaminophen **OR** acetaminophen, nitroGLYCERIN, ondansetron **OR** ondansetron (ZOFRAN) IV  Micro Results Recent Results (from the past 240 hour(s))  MRSA PCR Screening     Status: None   Collection Time: 03/30/17  5:23 AM  Result Value Ref Range Status   MRSA by PCR NEGATIVE NEGATIVE Final    Comment:        The GeneXpert MRSA Assay (FDA approved for NASAL specimens only), is one component of a comprehensive MRSA colonization surveillance program. It is not intended to diagnose MRSA infection nor to guide or monitor treatment for MRSA infections. Performed at [facility], [address], [place], [place] [address]     Radiology Reports Dg Chest 2 View  Result Date: 03/30/2017 CLINICAL DATA:  67 year old male with increasing left chest pain and  shortness of breath for 1 day. Cough for 1 week. EXAM: CHEST - 2 VIEW COMPARISON:  Chest CTA and radiographs 12/13/2012 and earlier. FINDINGS: Upright AP and lateral views of the chest normal lung volumes. Normal cardiac size and mediastinal contours. Visualized tracheal air column is within normal limits. The lungs are clear. No pneumothorax or pleural effusion. Negative visible bowel gas pattern. No acute osseous abnormality identified. IMPRESSION: No acute cardiopulmonary abnormality. Electronically Signed   By: Genevie Ann M.D.   On: 03/30/2017 00:46   US Renal  Result Date: 03/30/2017 CLINICAL DATA:  Acute kidney injury. EXAM: RENAL / URINARY TRACT ULTRASOUND COMPLETE COMPARISON:  CT abdomen pelvis dated June 05, 2013. FINDINGS: Right Kidney: Length: 11.4 cm. Echogenicity within normal limits. No mass or hydronephrosis visualized. Left Kidney: Length: 11.5 cm. Echogenicity within normal limits. No mass or hydronephrosis visualized. Bladder: Appears normal for degree of bladder distention. Incidental note is made of increased hepatic parenchymal echogenicity. IMPRESSION: 1. Normal renal ultrasound. 2. Hepatic steatosis. Electronically Signed   By: Titus Dubin M.D.   On: 03/30/2017 10:35    Time Spent in minutes  25   Mamie Diiorio M.D on 03/30/2017 at 4:18 PM  Between 7am to 7pm - Pager - [phone]  After 7pm go to [email or web address] - password [facility]  [facility] -  Office  [phone]

## 2017-03-31 DIAGNOSIS — G4733 Obstructive sleep apnea (adult) (pediatric): Secondary | ICD-10-CM | POA: Diagnosis not present

## 2017-03-31 DIAGNOSIS — N179 Acute kidney failure, unspecified: Secondary | ICD-10-CM

## 2017-03-31 DIAGNOSIS — E1159 Type 2 diabetes mellitus with other circulatory complications: Secondary | ICD-10-CM | POA: Diagnosis not present

## 2017-03-31 DIAGNOSIS — Z9989 Dependence on other enabling machines and devices: Secondary | ICD-10-CM

## 2017-03-31 DIAGNOSIS — E11649 Type 2 diabetes mellitus with hypoglycemia without coma: Secondary | ICD-10-CM | POA: Diagnosis not present

## 2017-03-31 DIAGNOSIS — Z794 Long term (current) use of insulin: Secondary | ICD-10-CM | POA: Diagnosis not present

## 2017-03-31 LAB — CBC
HCT: 36.1 % — ABNORMAL LOW (ref 39.0–52.0)
HEMOGLOBIN: 11.7 g/dL — AB (ref 13.0–17.0)
MCH: 29.9 pg (ref 26.0–34.0)
MCHC: 32.4 g/dL (ref 30.0–36.0)
MCV: 92.3 fL (ref 78.0–100.0)
PLATELETS: 214 10*3/uL (ref 150–400)
RBC: 3.91 MIL/uL — AB (ref 4.22–5.81)
RDW: 13.4 % (ref 11.5–15.5)
WBC: 4.5 10*3/uL (ref 4.0–10.5)

## 2017-03-31 LAB — BASIC METABOLIC PANEL
ANION GAP: 10 (ref 5–15)
BUN: 26 mg/dL — ABNORMAL HIGH (ref 6–20)
CHLORIDE: 106 mmol/L (ref 101–111)
CO2: 24 mmol/L (ref 22–32)
CREATININE: 1.12 mg/dL (ref 0.61–1.24)
Calcium: 8.8 mg/dL — ABNORMAL LOW (ref 8.9–10.3)
GFR calc non Af Amer: >60 mL/min (ref >60)
Glucose, Bld: 175 mg/dL — ABNORMAL HIGH (ref 65–99)
Potassium: 4.1 mmol/L (ref 3.5–5.1)
SODIUM: 140 mmol/L (ref 135–145)

## 2017-03-31 LAB — GLUCOSE, CAPILLARY
GLUCOSE-CAPILLARY: 228 mg/dL — AB (ref 65–99)
GLUCOSE-CAPILLARY: 167 mg/dL — AB (ref 65–99)
Glucose-Capillary: 184 mg/dL — ABNORMAL HIGH (ref 65–99)
Glucose-Capillary: 195 mg/dL — ABNORMAL HIGH (ref 65–99)

## 2017-03-31 LAB — HIV ANTIBODY (ROUTINE TESTING W REFLEX): HIV SCREEN 4TH GENERATION: NONREACTIVE

## 2017-03-31 LAB — C-PEPTIDE

## 2017-03-31 MED ORDER — INSULIN REGULAR HUMAN (CONC) 500 UNIT/ML ~~LOC~~ SOPN: 30.0000 [IU] | PEN_INJECTOR | Freq: Three times a day (TID) | SUBCUTANEOUS | Status: DC

## 2017-03-31 MED ORDER — INSULIN REGULAR HUMAN (CONC) 500 UNIT/ML ~~LOC~~ SOPN: 25.0000 [IU] | PEN_INJECTOR | Freq: Three times a day (TID) | SUBCUTANEOUS | 2 refills | Status: DC

## 2017-03-31 NOTE — Discharge Instructions (Signed)
Hypoglycemia Hypoglycemia occurs when the level of sugar (glucose) in the blood is too low. Glucose is a type of sugar that provides the body's main source of energy. Certain hormones (insulin and glucagon) control the level of glucose in the blood. Insulin lowers blood glucose, and glucagon increases blood glucose. Hypoglycemia can result from having too much insulin in the bloodstream, or from not eating enough food that contains glucose. Hypoglycemia can happen in people who do or do not have diabetes. It can develop quickly, and it can be a medical emergency. What are the causes? Hypoglycemia occurs most often in people who have diabetes. If you have diabetes, hypoglycemia may be caused by:  Diabetes medicine.  Not eating enough, or not eating often enough.  Increased physical activity.  Drinking alcohol, especially when you have not eaten recently.  If you do not have diabetes, hypoglycemia may be caused by:  A tumor in the pancreas. The pancreas is the organ that makes insulin.  Not eating enough, or not eating for long periods at a time (fasting).  Severe infection or illness that affects the liver, heart, or kidneys.  Certain medicines.  You may also have reactive hypoglycemia. This condition causes hypoglycemia within 4 hours of eating a meal. This may occur after having stomach surgery. Sometimes, the cause of reactive hypoglycemia is not known. What increases the risk? Hypoglycemia is more likely to develop in:  People who have diabetes and take medicines to lower blood glucose.  People who abuse alcohol.  People who have a severe illness.  What are the signs or symptoms? Hypoglycemia may not cause any symptoms. If you have symptoms, they may include:  Hunger.  Anxiety.  Sweating and feeling clammy.  Confusion.  Dizziness or feeling light-headed.  Sleepiness.  Nausea.  Increased heart rate.  Headache.  Blurry  vision.  Seizure.  Nightmares.  Tingling or numbness around the mouth, lips, or tongue.  A change in speech.  Decreased ability to concentrate.  A change in coordination.  Restless sleep.  Tremors or shakes.  Fainting.  Irritability.  How is this diagnosed? Hypoglycemia is diagnosed with a blood test to measure your blood glucose level. This blood test is done while you are having symptoms. Your health care provider may also do a physical exam and review your medical history. If you do not have diabetes, other tests may be done to find the cause of your hypoglycemia. How is this treated? This condition can often be treated by immediately eating or drinking something that contains glucose, such as:  3-4 sugar tablets (glucose pills).  Glucose gel, 15-gram tube.  Fruit juice, 4 oz (120 mL).  Regular soda (not diet soda), 4 oz (120 mL).  Low-fat milk, 4 oz (120 mL).  Several pieces of hard candy.  Sugar or honey, 1 Tbsp.  Treating Hypoglycemia If You Have Diabetes  If you are alert and able to swallow safely, follow the 15:15 rule:  Take 15 grams of a rapid-acting carbohydrate. Rapid-acting options include: ? 1 tube of glucose gel. ? 3 glucose pills. ? 6-8 pieces of hard candy. ? 4 oz (120 mL) of fruit juice. ? 4 oz (120 ml) of regular (not diet) soda.  Check your blood glucose 15 minutes after you take the carbohydrate.  If the repeat blood glucose level is still at or below 70 mg/dL (3.9 mmol/L), take 15 grams of a carbohydrate again.  If your blood glucose level does not increase above 70 mg/dL (3.9 mmol/L)  after 3 tries, seek emergency medical care.  After your blood glucose level returns to normal, eat a meal or a snack within 1 hour.  Treating Severe Hypoglycemia Severe hypoglycemia is when your blood glucose level is at or below 54 mg/dL (3 mmol/L). Severe hypoglycemia is an emergency. Do not wait to see if the symptoms will go away. Get medical help  right away. Call your local emergency services (911 in the [place] Do not drive yourself to the hospital. If you have severe hypoglycemia and you cannot eat or drink, you may need an injection of glucagon. A family member or close friend should learn how to check your blood glucose and how to give you a glucagon injection. Ask your health care provider if you need to have an emergency glucagon injection kit available. Severe hypoglycemia may need to be treated in a hospital. The treatment may include getting glucose through an IV tube. You may also need treatment for the cause of your hypoglycemia. Follow these instructions at home: General instructions  Avoid any diets that cause you to not eat enough food. Talk with your health care provider before you start any new diet.  Take over-the-counter and prescription medicines only as told by your health care provider.  Limit alcohol intake to no more than 1 drink per day for nonpregnant women and 2 drinks per day for men. One drink equals 12 oz of beer, 5 oz of wine, or 1 oz of hard liquor.  Keep all follow-up visits as told by your health care provider. This is important. If You Have Diabetes:   Make sure you know the symptoms of hypoglycemia.  Always have a rapid-acting carbohydrate snack with you to treat low blood sugar.  Follow your diabetes management plan, as told by your health care provider. Make sure you: ? Take your medicines as directed. ? Follow your exercise plan. ? Follow your meal plan. Eat on time, and do not skip meals. ? Check your blood glucose as often as directed. Make sure to check your blood glucose before and after exercise. If you exercise longer or in a different way than usual, check your blood glucose more often. ? Follow your sick day plan whenever you cannot eat or drink normally. Make this plan in advance with your health care provider.  Share your diabetes management plan with people in your workplace, school,  and household.  Check your urine for ketones when you are ill and as told by your health care provider.  Carry a medical alert card or wear medical alert jewelry. If You Have Reactive Hypoglycemia or Low Blood Sugar From Other Causes:  Monitor your blood glucose as told by your health care provider.  Follow instructions from your health care provider about eating or drinking restrictions. Contact a health care provider if:  You have problems keeping your blood glucose in your target range.  You have frequent episodes of hypoglycemia. Get help right away if:  You continue to have hypoglycemia symptoms after eating or drinking something containing glucose.  Your blood glucose is at or below 54 mg/dL (3 mmol/L).  You have a seizure.  You faint. These symptoms may represent a serious problem that is an emergency. Do not wait to see if the symptoms will go away. Get medical help right away. Call your local emergency services (911 in the [place] Do not drive yourself to the hospital. This information is not intended to replace advice given to you by your health care  frequent episodes of hypoglycemia.  Get help right away if:   You continue to have hypoglycemia symptoms after eating or drinking something containing glucose.   Your blood glucose is at or below 54 mg/dL (3 mmol/L).   You have a seizure.   You faint.  These symptoms may represent a serious problem that is an emergency. Do not wait to see if the symptoms will go away. Get medical help right away. Call your local emergency services (911 in the [place] Do not drive yourself to the hospital.  This information is not intended to replace advice given to you by your health care provider. Make sure you discuss any questions you have with your health care provider.  Document Released: 12/19/2004 Document Revised: 06/02/2015 Document Reviewed: 01/22/2015  Elsevier Interactive Patient Education  2018 [employer]

## 2017-03-31 NOTE — Plan of Care (Signed)
progressing 

## 2017-03-31 NOTE — Discharge Summary (Signed)
Physician Discharge Summary  Paul Baker TWS:[id number] DOB: 1950-01-17 DOA: 03/29/2017  PCP: [name], MD  Admit date: 03/29/2017 Discharge date: 03/31/2017  Admitted From: Home Disposition: Home  Recommendations for Outpatient Follow-up:  1. Follow up with endocrinologist Dr. [name] in 1 week.   Home Health: None Equipment/Devices: None  Discharge Condition: Fair CODE STATUS: Full code Diet recommendation: Heart Healthy / Carb Modified     Discharge Diagnoses:  Principal Problem:   Type 2 diabetes mellitus with hypoglycemia without coma (HCC)  Active Problems:   AKI (acute kidney injury) on chronic kidney disease stage II (HCC)   DM type 2 causing vascular disease (HCC)   Essential hypertension, benign   OSA on CPAP   Chest pain   Obesity, morbid (HCC)   Mixed hyperlipidemia   Other specified hypothyroidism  Brief narrative/HPI Please refer to admission H&P for details, in brief,67 year old male with uncontrolled insulin-dependent type 2 diabetes mellitus, morbid obesity, CAD, hypothyroidism, OSA on CPAP, restless leg syndrome, hypertension and hyperlipidemia presented to the ED with almost 2 weeks of off-and-on hypoglycemic readings on his home device.  For past 2 days he was having some facial numbness with tremulousness and diaphoresis as well.  He noticed that his blood glucose readings were as low as the 30s and still has symptoms despite being liberal with his diet (has been taking honey buns, cookies and pasta).  He also had some confusion and complained of some sharp left-sided chest pain during these episodes. He informs that his blood glucose was not mostly stable prior to 2 weeks ago.  Denies any recent illness, sick contact or travel.  Denies any new medication. His A1c is in the past have been mostly in the range of 8-9.  He was seen by his endocrinologist 2 months backand has been continued on U 500.  He was on 70 units with breakfast 70 units with  lunch and 40 units with supper but because of his low sugars he was taking 50/50/35 units despite which his sugars were still low. In the ED vitals were stable.  He was found to have acute on chronic kidney injury with creatinine of 2 (baseline 1.4).  Blood glucose was 71 and on repeat Accu-Chek it was 64.  He was given 1 L IV fluids and some orange juice.  Chest x-ray and EKG were unremarkable.  Patient admitted for further management.   Hospital course  Principal Problem: Recurrent hypoglycemia in uncontrolled type 2 diabetes mellitus  I suspect acute kidney injury could be contributing to delayed clearance of insulin from the system. Patient's home insulin and metformin were held.    No further hypoglycemic symptoms and he CBG were elevated to 400s on 3/29. -C-peptide ordered.  I have called his endocrinologist and left a message. -A1c of 7.8 and has improved from 2 months back.  CBG remains stable and I will discharge him home and instructed him to take his U500 dosing it lower as 25 units with each meals 3 times a day.  Instructed to monitor his CBG at least 4 times daily and keep a log of and show it to his endocrinologist.  Instructed on being aware of hypoglycemic symptoms. I am going to hold his metformin until his renal function remains normal as outpatient.  Active Problems: Acute kidney injury on chronic kidney disease stage II. Likely prerenal.  Renal function normal with IV fluids.  Renal ultrasound unremarkable.  I will discontinue his ARB and metformin for now until  his renal function remains stable during outpatient follow-up.  Chest pain  Appears atypical and could be related to hypoglycemia. EKG and serial troponins have been negative.  Resolved.    Essential hypertension, benign Stable.    Continue amlodipine and Imdur.  Will resume Lasix.  Held ARB (can be resumed if renal function stable)     OSA on CPAP    Obesity, morbid ([place]) Needs counseling on weight  loss and exercise.  DM type 2 causing vascular disease ([name]) As above.  Continue Requip.  Mixed hyperlipidemia Continue statin.      Other specified hypothyroidism Taking Synthroid.  TSH normal.     Family Communication  :  Discussed with wife while in the hospital  Disposition Plan  : Home   Consults  : none  Procedures  : Renal ultrasound     Discharge Instructions   Allergies as of 03/31/2017      Reactions   Cardizem [diltiazem Hcl]    Edema    Cardura [doxazosin Mesylate]    Side effects   Lipitor [atorvastatin] Other (See Comments)   Leg cramps   Paxil [paroxetine Hcl]    Side effects   Adhesive [tape] Rash   Codeine Rash, Other (See Comments)   Headache       Medication List    STOP taking these medications   losartan 50 MG tablet Commonly known as:  COZAAR   metFORMIN 500 MG tablet Commonly known as:  GLUCOPHAGE     TAKE these medications   amLODipine 5 MG tablet Commonly known as:  NORVASC TAKE ONE TABLET BY MOUTH ONCE DAILY.   B-D ULTRAFINE III SHORT PEN 31G X 8 MM Misc Generic drug:  Insulin Pen Needle USING FIVE TIMES DAILY.   clonazePAM 1 MG tablet Commonly known as:  KLONOPIN TAKE (1) TABLET BY MOUTH AT BEDTIME FOR RESTLESS LEGS.   fenofibrate 160 MG tablet TAKE (1) TABLET BY MOUTH AT BEDTIME.   FREESTYLE LIBRE SENSOR SYSTEM Misc Use one sensor every 10 days.   FREESTYLE LIBRE SENSOR SYSTEM Misc Use one sensor every 10 days.   furosemide 40 MG tablet Commonly known as:  LASIX Take 40 mg by mouth daily.   insulin regular human CONCENTRATED 500 UNIT/ML kwikpen Commonly known as:  HUMULIN R U-500 KWIKPEN Inject 25 Units into the skin 3 (three) times daily with meals. What changed:  See the new instructions.   isosorbide mononitrate 60 MG 24 hr tablet Commonly known as:  IMDUR TAKE (1/2) TABLET BY MOUTH TWICE DAILY.   levothyroxine 137 MCG tablet Commonly known as:  SYNTHROID, LEVOTHROID TAKE (1) TABLET BY  MOUTH EACH MORNING BEFORE BREAKFAST.   loratadine 10 MG tablet Commonly known as:  CLARITIN Take 10 mg by mouth at bedtime as needed for allergies.   metoprolol succinate 50 MG 24 hr tablet Commonly known as:  TOPROL-XL TAKE (1) TABLET BY MOUTH TWICE DAILY.   modafinil 200 MG tablet Commonly known as:  PROVIGIL TAKE ONE TABLET BY MOUTH ONCE DAILY.   MULTIVITAMIN ADULTS 50+ PO Take 1 tablet by mouth daily.   nitroGLYCERIN 0.4 MG SL tablet Commonly known as:  NITROSTAT DISSOLVE 1 TABLET UNDER TONGUE EVERY 5 MINUTES UP TO 15 MIN FOR CHESTPAIN. IF NO RELIEF CALL 911.   ONE TOUCH ULTRA TEST test strip Generic drug:  glucose blood TEST BLOOD SUGAR UP TO 4 TIMES DAILY.   ONETOUCH DELICA LANCETS 94W Misc USE AS DIRECTED TO TEST BLOOD SUGAR 4 TIMES DAILY.  pravastatin 20 MG tablet Commonly known as:  PRAVACHOL TAKE (1) TABLET BY MOUTH ONCE DAILY.   rOPINIRole 2 MG 24 hr tablet Commonly known as:  REQUIP XL Take 1 tablet (2 mg total) by mouth at bedtime.   rOPINIRole 4 MG tablet Commonly known as:  REQUIP TAKE 1/2 TABLET BY MOUTH AT LUNCH AND 1 TABLET BY MOUTH AT BEDTIME.   sodium chloride 0.65 % Soln nasal spray Commonly known as:  OCEAN Place 1-2 sprays into both nostrils daily as needed for congestion.   Testosterone Cypionate & Prop 200-20 MG/ML Soln Inject 1 mL into the muscle every 14 (fourteen) days.   vitamin B-12 100 MCG tablet Commonly known as:  CYANOCOBALAMIN Take 100 mcg by mouth daily.   Vitamin D (Ergocalciferol) 50000 units Caps capsule Commonly known as:  DRISDOL TAKE 1 CAPSULE BY MOUTH ONCE A WEEK.      Follow-up Information    Nida, Marella Chimes, MD. Schedule an appointment as soon as possible for a visit in 1 week(s).   Specialty:  Endocrinology Contact information: [place] [address] [phone]        [name], MD. Schedule an appointment as soon as possible for a visit in 2 week(s).   Specialty:   Family Medicine Contact information: [place] [address] [phone]          Allergies  Allergen Reactions  . Cardizem [Diltiazem Hcl]     Edema   . Cardura [Doxazosin Mesylate]     Side effects  . Lipitor [Atorvastatin] Other (See Comments)    Leg cramps  . Paxil [Paroxetine Hcl]     Side effects   . Adhesive [Tape] Rash  . Codeine Rash and Other (See Comments)    Headache       Procedures/Studies: Dg Chest 2 View  Result Date: 03/30/2017 CLINICAL DATA:  67 year old male with increasing left chest pain and shortness of breath for 1 day. Cough for 1 week. EXAM: CHEST - 2 VIEW COMPARISON:  Chest CTA and radiographs 12/13/2012 and earlier. FINDINGS: Upright AP and lateral views of the chest normal lung volumes. Normal cardiac size and mediastinal contours. Visualized tracheal air column is within normal limits. The lungs are clear. No pneumothorax or pleural effusion. Negative visible bowel gas pattern. No acute osseous abnormality identified. IMPRESSION: No acute cardiopulmonary abnormality. Electronically Signed   By: Genevie Ann M.D.   On: 03/30/2017 00:46   US Renal  Result Date: 03/30/2017 CLINICAL DATA:  Acute kidney injury. EXAM: RENAL / URINARY TRACT ULTRASOUND COMPLETE COMPARISON:  CT abdomen pelvis dated June 05, 2013. FINDINGS: Right Kidney: Length: 11.4 cm. Echogenicity within normal limits. No mass or hydronephrosis visualized. Left Kidney: Length: 11.5 cm. Echogenicity within normal limits. No mass or hydronephrosis visualized. Bladder: Appears normal for degree of bladder distention. Incidental note is made of increased hepatic parenchymal echogenicity. IMPRESSION: 1. Normal renal ultrasound. 2. Hepatic steatosis. Electronically Signed   By: Titus Dubin M.D.   On: 03/30/2017 10:35       Subjective: Continues to feel better.  CBG normal overnight  Discharge Exam: Vitals:   03/30/17 2200 03/31/17 0500  BP: 140/70 118/72  Pulse: 73  65  Resp: 18 18  Temp: (!) 97.4 F (36.3 C) (!) 97.5 F (36.4 C)  SpO2: 95% 97%   Vitals:   03/30/17 0500 03/30/17 1300 03/30/17 2200 03/31/17 0500  BP:  114/61 140/70 118/72  Pulse:  73 73 65  Resp:  18 18 18   Temp:  98.6 F (37 C) (!) 97.4 F (36.3 C) (!) 97.5 F (36.4 C)  TempSrc:  Oral Oral Axillary  SpO2:   95% 97%  Weight: 98.9 kg (218 lb)   98.3 kg (216 lb 11.4 oz)  Height:        General: Morbidly obese male not in distress HEENT: Moist mucosa, supple neck Chest: Clear bilaterally CVS: Normal S1 and S2, no murmurs  GI: Soft, nondistended, nontender Musculoskeletal: Warm, no edema    The results of significant diagnostics from this hospitalization (including imaging, microbiology, ancillary and laboratory) are listed below for reference.     Microbiology: Recent Results (from the past 240 hour(s))  MRSA PCR Screening     Status: None   Collection Time: 03/30/17  5:23 AM  Result Value Ref Range Status   MRSA by PCR NEGATIVE NEGATIVE Final    Comment:        The GeneXpert MRSA Assay (FDA approved for NASAL specimens only), is one component of a comprehensive MRSA colonization surveillance program. It is not intended to diagnose MRSA infection nor to guide or monitor treatment for MRSA infections. Performed at [facility], [address] [place], [place] [address]      Labs: BNP (last 3 results) No results for input(s): BNP in the last 8760 hours. Basic Metabolic Panel: Recent Labs  Lab 03/29/17 2357 03/30/17 0421 03/31/17 0622  NA 139  --  140  K 3.6  --  4.1  CL 101  --  106  CO2 26  --  24  GLUCOSE 71  --  175*  BUN 39*  --  26*  CREATININE 2.01* 1.71* 1.12  CALCIUM 9.9  --  8.8*   Liver Function Tests: No results for input(s): AST, ALT, ALKPHOS, BILITOT, PROT, ALBUMIN in the last 168 hours. No results for input(s): LIPASE, AMYLASE in the last 168 hours. No results for input(s): AMMONIA in the last 168 hours. CBC: Recent Labs  Lab  03/29/17 2357 03/30/17 0421 03/31/17 0622  WBC 8.0 7.4 4.5  HGB 12.4* 11.5* 11.7*  HCT 38.2* 35.3* 36.1*  MCV 92.3 93.1 92.3  PLT 245 216 214   Cardiac Enzymes: Recent Labs  Lab 03/30/17 0421 03/30/17 0955 03/30/17 1551  TROPONINI <0.03 <0.03 <0.03   BNP: Invalid input(s): POCBNP CBG: Recent Labs  Lab 03/30/17 2318 03/31/17 0140 03/31/17 0403 03/31/17 0622 03/31/17 0742  GLUCAP 251* 228* 167* 184* 195*   D-Dimer Recent Labs    03/29/17 2357  DDIMER 0.33   Hgb A1c Recent Labs    03/30/17 0421  HGBA1C 7.8*   Lipid Profile No results for input(s): CHOL, HDL, LDLCALC, TRIG, CHOLHDL, LDLDIRECT in the last 72 hours. Thyroid function studies No results for input(s): TSH, T4TOTAL, T3FREE, THYROIDAB in the last 72 hours.  Invalid input(s): FREET3 Anemia work up No results for input(s): VITAMINB12, FOLATE, FERRITIN, TIBC, IRON, RETICCTPCT in the last 72 hours. Urinalysis    Component Value Date/Time   COLORURINE YELLOW 12/03/2014 1108   APPEARANCEUR CLEAR 12/03/2014 1108   LABSPEC 1.019 12/03/2014 1108   PHURINE 6.0 12/03/2014 1108   GLUCOSEU >1000 (A) 12/03/2014 1108   HGBUR NEGATIVE 12/03/2014 1108   BILIRUBINUR NEGATIVE 12/03/2014 1108   KETONESUR NEGATIVE 12/03/2014 1108   PROTEINUR NEGATIVE 12/03/2014 1108   UROBILINOGEN 0.2 03/13/2014 1149   NITRITE NEGATIVE 12/03/2014 1108   LEUKOCYTESUR NEGATIVE 12/03/2014 1108   Sepsis Labs Invalid input(s): PROCALCITONIN,  WBC,  LACTICIDVEN  Microbiology Recent Results (from the past 240 hour(s))  MRSA PCR Screening     Status: None   Collection Time: 03/30/17  5:23 AM  Result Value Ref Range Status   MRSA by PCR NEGATIVE NEGATIVE Final    Comment:        The GeneXpert MRSA Assay (FDA approved for NASAL specimens only), is one component of a comprehensive MRSA colonization surveillance program. It is not intended to diagnose MRSA infection nor to guide or monitor treatment for MRSA  infections. Performed at [facility], [address], [place], [place] [address]      Time coordinating discharge: Over 30 minutes  SIGNED:   Louellen Molder, MD  [facility] 03/31/2017, 8:56 AM Pager   If 7PM-7AM, please contact night-coverage [email or web address] Password [email or web address]

## 2017-03-31 NOTE — Progress Notes (Signed)
Removed IV-clean, dry, intact. Reviewed d/c paperwork with pt and wife. Discussed new medications. Answered all questions. Stable pt walked with wife to the car.

## 2017-04-02 NOTE — Progress Notes (Signed)
Cardiology Office Note  Date: 04/03/2017   ID: [name], DOB April 07, 1950, MRN [id number]  PCP: [name], MD  Primary Cardiologist: [name], MD   Chief Complaint  Patient presents with  . Coronary Artery Disease    History of Present Illness: Paul Baker is a 67 y.o. male last seen in March 2018.  He presents for a follow-up visit. Record review finds recent hospitalization due to symptomatic hypoglycemia in the setting of type 2 diabetes mellitus.  He also had acute on chronic renal insufficiency.  Medications were adjusted by the primary team.  He had follow-up with his endocrinologist today and continues medication adjustments.  During hospital stay he does describe having had some angina symptoms in the setting of hypoglycemia.  This has settled down.  He remains on the present cardiac regimen outlined below.  I reviewed his recent ECG which was normal.  Past Medical History:  Diagnosis Date  . Arthritis   . Asthma   . Colon polyp   . Coronary atherosclerosis of native coronary artery    Diseased nondominant RCA  . DVT (deep venous thrombosis) ([place]) 2005   Right arm  . Essential hypertension, benign   . Headache   . History of transfusion   . Hypersomnia    CPAP of 16 cm, diagnosed with AHI of 60 in 2012,[place] 21- narcolepsy?  [name] Hypothyroidism   . Mixed hyperlipidemia   . Morbid obesity ([place])   . MRSA (methicillin resistant staph aureus) culture positive    08/2012  . Narcolepsy   . OSA (obstructive sleep apnea)   . Pneumonia   . Psoriasis   . Pulmonary embolism ([place]) 2004  . RLS (restless legs syndrome)   . Rotator cuff disorder    Left  . Septic arthritis of knee, left ([place])   . Skin cancer, basal cell   . Type 2 diabetes mellitus ([place])     Past Surgical History:  Procedure Laterality Date  . BACK SURGERY    . CATARACT EXTRACTION Bilateral   . COLONOSCOPY    . CYST REMOVAL TRUNK     from back  . EYE SURGERY Left 2016    laser to left eye  . HIP SURGERY     bone removed from both sides of hip  . KNEE ARTHROTOMY Right 12/04/2014   Procedure: KNEE ARTHROTOMY PATELLA LIGAMENT RECONSTRUSION AND REPAIR RIGHT KNEE;  Surgeon: Paralee Cancel, MD;  Location: [facility];  Service: Orthopedics;  Laterality: Right;  . KNEE SURGERY     X 25 TIMES  . LUMBAR DISC SURGERY     Left L3, L4, L5 discecotomy with decompression of L4 root  . TONSILLECTOMY    . TOTAL KNEE ARTHROPLASTY  2003   LEFT  . TOTAL KNEE ARTHROPLASTY Right 03/23/2014   Procedure: RIGHT TOTAL KNEE ARTHROPLASTY AND REMOVAL RIGHT TIBIAL  DEEP IMPLANT STAPLE;  Surgeon: Paralee Cancel, MD;  Location: [facility];  Service: Orthopedics;  Laterality: Right;  . TOTAL KNEE REVISION  2005   LEFT  . WRIST SURGERY      Current Outpatient Medications  Medication Sig Dispense Refill  . amLODipine (NORVASC) 5 MG tablet TAKE ONE TABLET BY MOUTH ONCE DAILY. 30 tablet 6  . B-D ULTRAFINE III SHORT PEN 31G X 8 MM MISC USING FIVE TIMES DAILY. 150 each 5  . clonazePAM (KLONOPIN) 1 MG tablet TAKE (1) TABLET BY MOUTH AT BEDTIME FOR RESTLESS LEGS. 30 tablet 3  . Continuous Blood Gluc  Sensor (FREESTYLE LIBRE SENSOR SYSTEM) MISC Use one sensor every 10 days. 3 each 2  . Continuous Blood Gluc Sensor (FREESTYLE LIBRE SENSOR SYSTEM) MISC Use one sensor every 10 days. 3 each 2  . fenofibrate 160 MG tablet TAKE (1) TABLET BY MOUTH AT BEDTIME. 30 tablet 5  . furosemide (LASIX) 40 MG tablet Take 40 mg by mouth daily.     . insulin degludec (TRESIBA FLEXTOUCH) 100 UNIT/ML SOPN FlexTouch Pen Inject 0.5 mLs (50 Units total) into the skin daily at 10 pm. 5 pen 2  . insulin lispro (HUMALOG KWIKPEN) 100 UNIT/ML KiwkPen Inject 0.1-0.16 mLs (10-16 Units total) into the skin 3 (three) times daily. 5 pen 2  . isosorbide mononitrate (IMDUR) 60 MG 24 hr tablet TAKE (1/2) TABLET BY MOUTH TWICE DAILY. 90 tablet 0  . levothyroxine (SYNTHROID, LEVOTHROID) 137 MCG tablet TAKE (1) TABLET BY MOUTH EACH MORNING  BEFORE BREAKFAST. 30 tablet 2  . loratadine (CLARITIN) 10 MG tablet Take 10 mg by mouth at bedtime as needed for allergies.     . metoprolol succinate (TOPROL-XL) 50 MG 24 hr tablet TAKE (1) TABLET BY MOUTH TWICE DAILY. 60 tablet 6  . modafinil (PROVIGIL) 200 MG tablet TAKE ONE TABLET BY MOUTH ONCE DAILY. 30 tablet 5  . Multiple Vitamins-Minerals (MULTIVITAMIN ADULTS 50+ PO) Take 1 tablet by mouth daily.    . nitroGLYCERIN (NITROSTAT) 0.4 MG SL tablet DISSOLVE 1 TABLET UNDER TONGUE EVERY 5 MINUTES UP TO 15 MIN FOR CHESTPAIN. IF NO RELIEF CALL 911. 25 tablet 3  . ONE TOUCH ULTRA TEST test strip TEST BLOOD SUGAR UP TO 4 TIMES DAILY. 150 each 5  . ONETOUCH DELICA LANCETS 29J MISC USE AS DIRECTED TO TEST BLOOD SUGAR 4 TIMES DAILY. 150 each 5  . pravastatin (PRAVACHOL) 20 MG tablet TAKE (1) TABLET BY MOUTH ONCE DAILY. 30 tablet 5  . rOPINIRole (REQUIP XL) 2 MG 24 hr tablet Take 1 tablet (2 mg total) by mouth at bedtime. 30 tablet 5  . rOPINIRole (REQUIP) 4 MG tablet TAKE 1/2 TABLET BY MOUTH AT LUNCH AND 1 TABLET BY MOUTH AT BEDTIME. 45 tablet 5  . sodium chloride (OCEAN) 0.65 % SOLN nasal spray Place 1-2 sprays into both nostrils daily as needed for congestion.    . Testosterone Cypionate & Prop 200-20 MG/ML SOLN Inject 1 mL into the muscle every 14 (fourteen) days.    . vitamin B-12 (CYANOCOBALAMIN) 100 MCG tablet Take 100 mcg by mouth daily.    . Vitamin D, Ergocalciferol, (DRISDOL) 50000 units CAPS capsule TAKE 1 CAPSULE BY MOUTH ONCE A WEEK. 12 capsule 0   No current facility-administered medications for this visit.    Allergies:  Cardizem [diltiazem hcl]; Cardura [doxazosin mesylate]; Lipitor [atorvastatin]; Paxil [paroxetine hcl]; Adhesive [tape]; and Codeine   Social History: The patient  reports that he quit smoking about 22 years ago. His smoking use included cigarettes. He started smoking about 49 years ago. He has never used smokeless tobacco. He reports that he does not drink alcohol or  use drugs.   ROS:  Please see the history of present illness. Otherwise, complete review of systems is positive for intermittent fatigue.  All other systems are reviewed and negative.   Physical Exam: VS:  BP (!) 158/84   Pulse 74   Ht 5\' 5"  (1.651 m)   Wt 215 lb (97.5 kg)   SpO2 99%   BMI 35.78 kg/m , BMI Body mass index is 35.78 kg/m.  Wt Readings from  Last 3 Encounters:  04/03/17 215 lb (97.5 kg)  04/03/17 216 lb (98 kg)  03/31/17 216 lb 11.4 oz (98.3 kg)    General: Patient appears comfortable at rest. HEENT: Conjunctiva and lids normal, oropharynx clear. Neck: Supple, no elevated JVP or carotid bruits, no thyromegaly. Lungs: Clear to auscultation, nonlabored breathing at rest. Cardiac: Regular rate and rhythm, no S3 or significant systolic murmur, no pericardial rub. Abdomen: Soft, nontender, bowel sounds present. Extremities: No pitting edema, distal pulses 2+. Skin: Warm and dry. Musculoskeletal: No kyphosis. Neuropsychiatric: Alert and oriented x3, affect grossly appropriate.  ECG: I personally reviewed the tracing from 03/30/2017 which showed sinus rhythm.  Recent Labwork: 12/21/2016: ALT 12; AST 16; TSH 1.24 03/31/2017: BUN 26; Creatinine, Ser 1.12; Hemoglobin 11.7; Platelets 214; Potassium 4.1; Sodium 140     Component Value Date/Time   CHOL 148 08/19/2015 0914   TRIG 77 08/19/2015 0914   HDL 53 08/19/2015 0914   CHOLHDL 2.8 08/19/2015 0914   CHOLHDL 4.3 11/13/2013 0843   VLDL 31 11/13/2013 0843   LDLCALC 80 08/19/2015 0914   Assessment and Plan:  1.  CAD with known disease including nondominant RCA which has been managed medically.  I reviewed his recent ECG and lab work.  Plan to continue medical therapy observation for now.  2.  Type 2 diabetes mellitus, recent hospitalization with symptomatic hypoglycemia and subsequent medication adjustments.  He is following with Dr. [name].  3.  Mixed hyperlipidemia, on Pravachol.  He continues to follow with Dr.  Wolfgang Phoenix.  4.  Essential hypertension, no changes made to present regimen.  Keep follow-up with Dr. Wolfgang Phoenix.  Current medicines were reviewed with the patient today.  Disposition: Follow-up in 6 months.  Signed, Satira Sark, MD, [facility] 04/03/2017 2:28 PM    [facility] [address] [place], [place] [address] Phone: [phone]; Fax: [phone]

## 2017-04-03 ENCOUNTER — Encounter: Payer: Self-pay | Admitting: Cardiology

## 2017-04-03 ENCOUNTER — Encounter: Payer: Self-pay | Admitting: "Endocrinology

## 2017-04-03 ENCOUNTER — Ambulatory Visit (INDEPENDENT_AMBULATORY_CARE_PROVIDER_SITE_OTHER): Payer: Medicare Other | Admitting: Cardiology

## 2017-04-03 ENCOUNTER — Ambulatory Visit (INDEPENDENT_AMBULATORY_CARE_PROVIDER_SITE_OTHER): Payer: Medicare Other | Admitting: "Endocrinology

## 2017-04-03 VITALS — BP 135/84 | HR 71 | Ht 65.0 in | Wt 216.0 lb

## 2017-04-03 VITALS — BP 158/84 | HR 74 | Ht 65.0 in | Wt 215.0 lb

## 2017-04-03 DIAGNOSIS — I25119 Atherosclerotic heart disease of native coronary artery with unspecified angina pectoris: Secondary | ICD-10-CM

## 2017-04-03 DIAGNOSIS — E1159 Type 2 diabetes mellitus with other circulatory complications: Secondary | ICD-10-CM | POA: Diagnosis not present

## 2017-04-03 DIAGNOSIS — E038 Other specified hypothyroidism: Secondary | ICD-10-CM | POA: Diagnosis not present

## 2017-04-03 DIAGNOSIS — I10 Essential (primary) hypertension: Secondary | ICD-10-CM

## 2017-04-03 DIAGNOSIS — E782 Mixed hyperlipidemia: Secondary | ICD-10-CM | POA: Diagnosis not present

## 2017-04-03 DIAGNOSIS — E1165 Type 2 diabetes mellitus with hyperglycemia: Secondary | ICD-10-CM | POA: Diagnosis not present

## 2017-04-03 DIAGNOSIS — Z794 Long term (current) use of insulin: Secondary | ICD-10-CM | POA: Diagnosis not present

## 2017-04-03 DIAGNOSIS — E118 Type 2 diabetes mellitus with unspecified complications: Secondary | ICD-10-CM | POA: Diagnosis not present

## 2017-04-03 DIAGNOSIS — IMO0002 Reserved for concepts with insufficient information to code with codable children: Secondary | ICD-10-CM

## 2017-04-03 MED ORDER — INSULIN LISPRO 100 UNIT/ML (KWIKPEN): 10.0000 [IU] | PEN_INJECTOR | Freq: Three times a day (TID) | SUBCUTANEOUS | 2 refills | Status: DC

## 2017-04-03 MED ORDER — INSULIN DEGLUDEC 100 UNIT/ML ~~LOC~~ SOPN: 50.0000 [IU] | PEN_INJECTOR | Freq: Every day | SUBCUTANEOUS | 2 refills | Status: DC

## 2017-04-03 NOTE — Progress Notes (Signed)
Subjective:    Patient ID: Paul Baker, male    DOB: September 17, 1960, PCP [name], MD   Past Medical History:  Diagnosis Date  . Arthritis   . Asthma   . Colon polyp   . Coronary atherosclerosis of native coronary artery    Diseased nondominant RCA  . DVT (deep venous thrombosis) ([place]) 2005   Right arm  . Essential hypertension, benign   . Headache   . History of transfusion   . Hypersomnia    CPAP of 16 cm, diagnosed with AHI of 60 in 2012,ep[place] 21- narcolepsy?  [name] Hypothyroidism   . Mixed hyperlipidemia   . Morbid obesity ([place])   . MRSA (methicillin resistant staph aureus) culture positive    08/2012  . Narcolepsy   . OSA (obstructive sleep apnea)   . Pneumonia   . Psoriasis   . Pulmonary embolism ([place]) 2004  . RLS (restless legs syndrome)   . Rotator cuff disorder    Left  . Septic arthritis of knee, left ([place])   . Skin cancer, basal cell   . Type 2 diabetes mellitus ([place])    Past Surgical History:  Procedure Laterality Date  . BACK SURGERY    . CATARACT EXTRACTION Bilateral   . COLONOSCOPY    . CYST REMOVAL TRUNK     from back  . EYE SURGERY Left 2016   laser to left eye  . HIP SURGERY     bone removed from both sides of hip  . KNEE ARTHROTOMY Right 12/04/2014   Procedure: KNEE ARTHROTOMY PATELLA LIGAMENT RECONSTRUSION AND REPAIR RIGHT KNEE;  Surgeon: Paralee Cancel, MD;  Location: [facility];  Service: Orthopedics;  Laterality: Right;  . KNEE SURGERY     X 25 TIMES  . LUMBAR DISC SURGERY     Left L3, L4, L5 discecotomy with decompression of L4 root  . TONSILLECTOMY    . TOTAL KNEE ARTHROPLASTY  2003   LEFT  . TOTAL KNEE ARTHROPLASTY Right 03/23/2014   Procedure: RIGHT TOTAL KNEE ARTHROPLASTY AND REMOVAL RIGHT TIBIAL  DEEP IMPLANT STAPLE;  Surgeon: Paralee Cancel, MD;  Location: [facility];  Service: Orthopedics;  Laterality: Right;  . TOTAL KNEE REVISION  2005   LEFT  . WRIST SURGERY     Social History   Socioeconomic History  . Marital status:  Married    Spouse name: Paul Baker  . Number of children: 2  . Years of education: college  . Highest education level: Not on file  Occupational History  . Occupation: Disabled    Fish farm manager: UNEMPLOYED  Social Needs  . Financial resource strain: Not on file  . Food insecurity:    Worry: Not on file    Inability: Not on file  . Transportation needs:    Medical: Not on file    Non-medical: Not on file  Tobacco Use  . Smoking status: Former Smoker    Types: Cigarettes    Start date: 06/19/1967    Last attempt to quit: 01/03/1995    Years since quitting: 22.2  . Smokeless tobacco: Never Used  Substance and Sexual Activity  . Alcohol use: No    Alcohol/week: 0.0 oz    Comment: quit drinking in 07/86  . Drug use: No  . Sexual activity: Yes    Partners: Female  Lifestyle  . Physical activity:    Days per week: Not on file    Minutes per session: Not on file  . Stress: Not on  file  Relationships  . Social connections:    Talks on phone: Not on file    Gets together: Not on file    Attends religious service: Not on file    Active member of club or organization: Not on file    Attends meetings of clubs or organizations: Not on file    Relationship status: Not on file  Other Topics Concern  . Not on file  Social History Narrative    67 year old, right-handed, caucasian male with a past medical history of obesity, hypertension, hyperlipidemia, diabetes, obstructive sleep apnea, presenting with frequent nighttime awakenings, excessive daytime sleepiness, also transient confusional episodes.RLS and one beosity, OSA on CPAP with AHI of 3.2 and  setting of 16 cm water , [facility] .   Outpatient Encounter Medications as of 04/03/2017  Medication Sig  . amLODipine (NORVASC) 5 MG tablet TAKE ONE TABLET BY MOUTH ONCE DAILY.  [name] B-D ULTRAFINE III SHORT PEN 31G X 8 MM MISC USING FIVE TIMES DAILY.  . clonazePAM (KLONOPIN) 1 MG tablet TAKE (1) TABLET BY MOUTH AT BEDTIME FOR RESTLESS LEGS.  [name]  Continuous Blood Gluc Sensor (FREESTYLE LIBRE SENSOR SYSTEM) MISC Use one sensor every 10 days.  . Continuous Blood Gluc Sensor (FREESTYLE LIBRE SENSOR SYSTEM) MISC Use one sensor every 10 days.  . fenofibrate 160 MG tablet TAKE (1) TABLET BY MOUTH AT BEDTIME.  . furosemide (LASIX) 40 MG tablet Take 40 mg by mouth daily.   . insulin degludec (TRESIBA FLEXTOUCH) 100 UNIT/ML SOPN FlexTouch Pen Inject 0.5 mLs (50 Units total) into the skin daily at 10 pm.  . insulin lispro (HUMALOG KWIKPEN) 100 UNIT/ML KiwkPen Inject 0.1-0.16 mLs (10-16 Units total) into the skin 3 (three) times daily.  . isosorbide mononitrate (IMDUR) 60 MG 24 hr tablet TAKE (1/2) TABLET BY MOUTH TWICE DAILY.  [name] levothyroxine (SYNTHROID, LEVOTHROID) 137 MCG tablet TAKE (1) TABLET BY MOUTH EACH MORNING BEFORE BREAKFAST.  [name] loratadine (CLARITIN) 10 MG tablet Take 10 mg by mouth at bedtime as needed for allergies.   . metoprolol succinate (TOPROL-XL) 50 MG 24 hr tablet TAKE (1) TABLET BY MOUTH TWICE DAILY.  . modafinil (PROVIGIL) 200 MG tablet TAKE ONE TABLET BY MOUTH ONCE DAILY.  . Multiple Vitamins-Minerals (MULTIVITAMIN ADULTS 50+ PO) Take 1 tablet by mouth daily.  . nitroGLYCERIN (NITROSTAT) 0.4 MG SL tablet DISSOLVE 1 TABLET UNDER TONGUE EVERY 5 MINUTES UP TO 15 MIN FOR CHESTPAIN. IF NO RELIEF CALL 911.  [name] ONE TOUCH ULTRA TEST test strip TEST BLOOD SUGAR UP TO 4 TIMES DAILY.  [name] ONETOUCH DELICA LANCETS 25K MISC USE AS DIRECTED TO TEST BLOOD SUGAR 4 TIMES DAILY.  . pravastatin (PRAVACHOL) 20 MG tablet TAKE (1) TABLET BY MOUTH ONCE DAILY.  [name] rOPINIRole (REQUIP XL) 2 MG 24 hr tablet Take 1 tablet (2 mg total) by mouth at bedtime.  [name] rOPINIRole (REQUIP) 4 MG tablet TAKE 1/2 TABLET BY MOUTH AT LUNCH AND 1 TABLET BY MOUTH AT BEDTIME.  . sodium chloride (OCEAN) 0.65 % SOLN nasal spray Place 1-2 sprays into both nostrils daily as needed for congestion.  . Testosterone Cypionate & Prop 200-20 MG/ML SOLN Inject 1 mL into the muscle every 14  (fourteen) days.  . vitamin B-12 (CYANOCOBALAMIN) 100 MCG tablet Take 100 mcg by mouth daily.  . Vitamin D, Ergocalciferol, (DRISDOL) 50000 units CAPS capsule TAKE 1 CAPSULE BY MOUTH ONCE A WEEK.  . [DISCONTINUED] insulin regular human CONCENTRATED (HUMULIN R U-500 KWIKPEN) 500 UNIT/ML kwikpen Inject 25 Units  into the skin 3 (three) times daily with meals. (Patient taking differently: Inject 70 Units into the skin 3 (three) times daily with meals. )   No facility-administered encounter medications on file as of 04/03/2017.    ALLERGIES: Allergies  Allergen Reactions  . Cardizem [Diltiazem Hcl]     Edema   . Cardura [Doxazosin Mesylate]     Side effects  . Lipitor [Atorvastatin] Other (See Comments)    Leg cramps  . Paxil [Paroxetine Hcl]     Side effects   . Adhesive [Tape] Rash  . Codeine Rash and Other (See Comments)    Headache    VACCINATION STATUS: Immunization History  Administered Date(s) Administered  . Influenza Split 09/18/2012  . Influenza,inj,Quad PF,6+ Mos 10/13/2013, 10/06/2014  . Influenza-Unspecified 12/01/2015, 11/08/2016  . Pneumococcal Polysaccharide-23 10/02/2004, 10/03/2011  . Td 05/24/2006    Diabetes  He presents for his follow-up diabetic visit. He has type 2 diabetes mellitus. Onset time: Was diagnosed at approximate age of 89 years. His disease course has been fluctuating. There are no hypoglycemic associated symptoms. Pertinent negatives for hypoglycemia include no confusion, headaches, pallor or seizures. Associated symptoms include polydipsia and polyuria. Pertinent negatives for diabetes include no chest pain, no fatigue, no polyphagia and no weakness. There are no hypoglycemic complications. Symptoms are worsening. Diabetic complications include heart disease. Risk factors for coronary artery disease include diabetes mellitus, dyslipidemia, male sex, obesity, sedentary lifestyle and tobacco exposure. Current diabetic treatment includes intensive insulin  program. He is compliant with treatment most of the time. His weight is stable. He is following a generally unhealthy diet. He has had a previous visit with a dietitian. He never participates in exercise. His home blood glucose trend is fluctuating dramatically. His breakfast blood glucose range is generally >200 mg/dl. His lunch blood glucose range is generally >200 mg/dl. His dinner blood glucose range is generally 140-180 mg/dl. His bedtime blood glucose range is generally >200 mg/dl. His overall blood glucose range is >200 mg/dl. (He did have an interval hospitalization for refractory hypoglycemia over the weekend, which did not resolve over 5 hours of ER observation.  He was discharged on decreased dose of his insulin.  He denies taking any other anti-diabetes medications particularly sulfonylureas.) An ACE inhibitor/angiotensin II receptor blocker is being taken. He sees a podiatrist.Eye exam is current.  Hyperlipidemia  This is a chronic problem. The current episode started more than 1 year ago. The problem is controlled. Exacerbating diseases include diabetes, hypothyroidism and obesity. Pertinent negatives include no chest pain, myalgias or shortness of breath. Current antihyperlipidemic treatment includes statins. Risk factors for coronary artery disease include dyslipidemia, diabetes mellitus, hypertension, male sex, obesity and a sedentary lifestyle.  Hypertension  This is a chronic problem. The current episode started more than 1 year ago. The problem is controlled. Pertinent negatives include no chest pain, headaches, neck pain, palpitations or shortness of breath. Risk factors for coronary artery disease include diabetes mellitus, dyslipidemia, male gender, obesity and sedentary lifestyle. Past treatments include angiotensin blockers. Hypertensive end-organ damage includes CAD/MI. Identifiable causes of hypertension include a thyroid problem.  Thyroid Problem  Presents for follow-up visit.  Patient reports no constipation, diarrhea, fatigue or palpitations. The symptoms have been improving. Past treatments include levothyroxine. His past medical history is significant for diabetes and hyperlipidemia.     Review of Systems  Constitutional: Negative for fatigue and unexpected weight change.  HENT: Negative for dental problem, mouth sores and trouble swallowing.   Eyes: Negative for visual  disturbance.  Respiratory: Negative for cough, choking, chest tightness, shortness of breath and wheezing.   Cardiovascular: Negative for chest pain, palpitations and leg swelling.  Gastrointestinal: Negative for abdominal distention, abdominal pain, constipation, diarrhea, nausea and vomiting.  Endocrine: Positive for polydipsia and polyuria. Negative for polyphagia.  Genitourinary: Negative for dysuria, flank pain, hematuria and urgency.  Musculoskeletal: Negative for back pain, gait problem, myalgias and neck pain.  Skin: Negative for pallor, rash and wound.  Neurological: Negative for seizures, syncope, weakness, numbness and headaches.  Psychiatric/Behavioral: Negative.  Negative for confusion and dysphoric mood.    Objective:    BP 135/84   Pulse 71   Ht 5\' 5"  (1.651 m)   Wt 216 lb (98 kg)   BMI 35.94 kg/m   Wt Readings from Last 3 Encounters:  04/03/17 216 lb (98 kg)  03/31/17 216 lb 11.4 oz (98.3 kg)  01/10/17 218 lb (98.9 kg)    Physical Exam  Constitutional: He is oriented to person, place, and time. He appears well-developed. He is cooperative. No distress.  HENT:  Head: Normocephalic and atraumatic.  Eyes: EOM are normal.  Neck: Normal range of motion. Neck supple. No tracheal deviation present. No thyromegaly present.  Cardiovascular: Normal rate, S1 normal and S2 normal. Exam reveals no gallop.  No murmur heard. Pulses:      Dorsalis pedis pulses are 1+ on the right side, and 1+ on the left side.       Posterior tibial pulses are 1+ on the right side, and 1+ on the  left side.  Pulmonary/Chest: Effort normal. No respiratory distress. He has no wheezes.  Abdominal: He exhibits no distension. There is no tenderness. There is no guarding and no CVA tenderness.  Musculoskeletal: He exhibits no edema.       Right shoulder: He exhibits no swelling and no deformity.  Neurological: He is alert and oriented to person, place, and time. He has normal strength and normal reflexes. No cranial nerve deficit or sensory deficit. Gait normal.  Skin: Skin is warm and dry. No rash noted. No cyanosis. Nails show no clubbing.  Psychiatric: He has a normal mood and affect. His speech is normal. Judgment normal. Cognition and memory are normal.    Results for orders placed or performed during the hospital encounter of 03/29/17  MRSA PCR Screening  Result Value Ref Range   MRSA by PCR NEGATIVE NEGATIVE  Basic metabolic panel  Result Value Ref Range   Sodium 139 135 - 145 mmol/L   Potassium 3.6 3.5 - 5.1 mmol/L   Chloride 101 101 - 111 mmol/L   CO2 26 22 - 32 mmol/L   Glucose, Bld 71 65 - 99 mg/dL   BUN 39 (H) 6 - 20 mg/dL   Creatinine, Ser 2.01 (H) 0.61 - 1.24 mg/dL   Calcium 9.9 8.9 - 10.3 mg/dL   GFR calc non Af Amer 33 (L) >60 mL/min   GFR calc Af Amer 38 (L) >60 mL/min   Anion gap 12 5 - 15  CBC  Result Value Ref Range   WBC 8.0 4.0 - 10.5 K/uL   RBC 4.14 (L) 4.22 - 5.81 MIL/uL   Hemoglobin 12.4 (L) 13.0 - 17.0 g/dL   HCT 38.2 (L) 39.0 - 52.0 %   MCV 92.3 78.0 - 100.0 fL   MCH 30.0 26.0 - 34.0 pg   MCHC 32.5 30.0 - 36.0 g/dL   RDW 13.6 11.5 - 15.5 %   Platelets 245 150 - 400  K/uL  D-dimer, quantitative (not at [facility])  Result Value Ref Range   D-Dimer, Quant 0.33 0.00 - 0.50 ug/mL-FEU  HIV antibody (Routine Testing)  Result Value Ref Range   HIV Screen 4th Generation wRfx Non Reactive Non Reactive  CBC  Result Value Ref Range   WBC 7.4 4.0 - 10.5 K/uL   RBC 3.79 (L) 4.22 - 5.81 MIL/uL   Hemoglobin 11.5 (L) 13.0 - 17.0 g/dL   HCT 35.3 (L) 39.0 - 52.0 %    MCV 93.1 78.0 - 100.0 fL   MCH 30.3 26.0 - 34.0 pg   MCHC 32.6 30.0 - 36.0 g/dL   RDW 13.6 11.5 - 15.5 %   Platelets 216 150 - 400 K/uL  Creatinine, serum  Result Value Ref Range   Creatinine, Ser 1.71 (H) 0.61 - 1.24 mg/dL   GFR calc non Af Amer 40 (L) >60 mL/min   GFR calc Af Amer 47 (L) >60 mL/min  Troponin I  Result Value Ref Range   Troponin I <0.03 <0.03 ng/mL  Troponin I  Result Value Ref Range   Troponin I <0.03 <0.03 ng/mL  Troponin I  Result Value Ref Range   Troponin I <0.03 <0.03 ng/mL  Hemoglobin A1c  Result Value Ref Range   Hgb A1c MFr Bld 7.8 (H) 4.8 - 5.6 %   Mean Plasma Glucose 177.16 mg/dL  C-peptide  Result Value Ref Range   C-Peptide <0.1 (L) 1.1 - 4.4 ng/mL  Glucose, capillary  Result Value Ref Range   Glucose-Capillary 87 65 - 99 mg/dL  Glucose, capillary  Result Value Ref Range   Glucose-Capillary 184 (H) 65 - 99 mg/dL   Comment 1 Document in Chart   Glucose, capillary  Result Value Ref Range   Glucose-Capillary 307 (H) 65 - 99 mg/dL   Comment 1 Document in Chart   Glucose, capillary  Result Value Ref Range   Glucose-Capillary 443 (H) 65 - 99 mg/dL  Glucose, capillary  Result Value Ref Range   Glucose-Capillary 413 (H) 65 - 99 mg/dL  Basic metabolic panel  Result Value Ref Range   Sodium 140 135 - 145 mmol/L   Potassium 4.1 3.5 - 5.1 mmol/L   Chloride 106 101 - 111 mmol/L   CO2 24 22 - 32 mmol/L   Glucose, Bld 175 (H) 65 - 99 mg/dL   BUN 26 (H) 6 - 20 mg/dL   Creatinine, Ser 1.12 0.61 - 1.24 mg/dL   Calcium 8.8 (L) 8.9 - 10.3 mg/dL   GFR calc non Af Amer >60 >60 mL/min   GFR calc Af Amer >60 >60 mL/min   Anion gap 10 5 - 15  CBC  Result Value Ref Range   WBC 4.5 4.0 - 10.5 K/uL   RBC 3.91 (L) 4.22 - 5.81 MIL/uL   Hemoglobin 11.7 (L) 13.0 - 17.0 g/dL   HCT 36.1 (L) 39.0 - 52.0 %   MCV 92.3 78.0 - 100.0 fL   MCH 29.9 26.0 - 34.0 pg   MCHC 32.4 30.0 - 36.0 g/dL   RDW 13.4 11.5 - 15.5 %   Platelets 214 150 - 400 K/uL  Glucose,  capillary  Result Value Ref Range   Glucose-Capillary 287 (H) 65 - 99 mg/dL  Glucose, capillary  Result Value Ref Range   Glucose-Capillary 243 (H) 65 - 99 mg/dL   Comment 1 Notify RN    Comment 2 Document in Chart   Glucose, capillary  Result Value Ref Range   Glucose-Capillary  286 (H) 65 - 99 mg/dL   Comment 1 Notify RN    Comment 2 Document in Chart   Glucose, capillary  Result Value Ref Range   Glucose-Capillary 251 (H) 65 - 99 mg/dL   Comment 1 Notify RN    Comment 2 Document in Chart   Glucose, capillary  Result Value Ref Range   Glucose-Capillary 228 (H) 65 - 99 mg/dL   Comment 1 Notify RN    Comment 2 Document in Chart   Glucose, capillary  Result Value Ref Range   Glucose-Capillary 167 (H) 65 - 99 mg/dL   Comment 1 Notify RN    Comment 2 Document in Chart   Glucose, capillary  Result Value Ref Range   Glucose-Capillary 184 (H) 65 - 99 mg/dL  Glucose, capillary  Result Value Ref Range   Glucose-Capillary 195 (H) 65 - 99 mg/dL   Comment 1 Notify RN    Comment 2 Document in Chart   I-stat troponin, ED  Result Value Ref Range   Troponin i, poc 0.00 0.00 - 0.08 ng/mL   Comment 3          POC CBG, ED  Result Value Ref Range   Glucose-Capillary 77 65 - 99 mg/dL  CBG monitoring, ED  Result Value Ref Range   Glucose-Capillary 64 (L) 65 - 99 mg/dL   Diabetic Labs (most recent): Lab Results  Component Value Date   HGBA1C 7.8 (H) 03/30/2017   HGBA1C 9.1 (H) 12/21/2016   HGBA1C 9.2 (H) 09/05/2016   Lipid Panel     Component Value Date/Time   CHOL 148 08/19/2015 0914   TRIG 77 08/19/2015 0914   HDL 53 08/19/2015 0914   CHOLHDL 2.8 08/19/2015 0914   CHOLHDL 4.3 11/13/2013 0843   VLDL 31 11/13/2013 0843   LDLCALC 80 08/19/2015 0914    Assessment & Plan:   1. Uncontrolled type 2 diabetes mellitus with other circulatory complication, with long-term current use of insulin (HCC)  -His diabetes is  complicated by ordinary artery disease and patient remains at  a high risk for more acute and chronic complications of diabetes which include CAD, CVA, CKD, retinopathy, and neuropathy. These are all discussed in detail with the patient.  -Recently, he was hospitalized for hypoglycemia, with no known triggers.  He denies overdosing insulin or taking any other anti-diabetes medications.    -His recent labs show A1c of 7.8% improving from 9.1%.     Glucose logs and insulin administration records pertaining to this visit,  to be scanned into patient's records.  Recent labs reviewed.   - I have re-counseled the patient on diet management and weight loss  by adopting a carbohydrate restricted / protein rich  Diet.  -  Suggestion is made for him to avoid simple carbohydrates  from his diet including Cakes, Sweet Desserts / Pastries, Ice Cream, Soda (diet and regular), Sweet Tea, Candies, Chips, Cookies, Store Bought Juices, Alcohol in Excess of  1-2 drinks a day, Artificial Sweeteners, and "Sugar-free" Products. This will help patient to have stable blood glucose profile and potentially avoid unintended weight gain.   - Patient is advised to stick to a routine mealtimes to eat 3 meals  a day and avoid unnecessary snacks ( to snack only to correct hypoglycemia).   - I have approached patient with the following individualized plan to manage diabetes and patient agrees. - He is dealing with traumatic fluctuation in his blood glucose profile while taking insulin U500.  -I  approached him to switch treatment to basal/bolus insulin and he agrees.   -I discussed and initiated 50 units of Tresiba nightly, Humalog 10-16 units 3 times daily before meals for pre-meal blood glucose  above 90 mg/dL. -I advised him to discontinue insulin U500 for now. - He is currently using CGM, advised to continue document blood glucose at least 4 times a day-before meals and at bedtime.   -Patient is encouraged to call clinic for blood glucose levels less than 70 or above 200 mg /dl x  3. - I advised him to stay off of metformin for now.    - Patient specific target  for A1c; LDL, HDL, Triglycerides, and  Waist Circumference were discussed in detail.  2) BP/HTN: His blood pressure is controlled to target.   I have advised him to continue his current medications including ACEI/ARB. 3) Lipids/HPL:  continue statins. 4)  Weight/Diet: CDE consult in progress, exercise, and carbohydrates information provided.  5) hypothyroidism - His recent thyroid function tests are consistent with appropriate replacement. I advised him to continue levothyroxine 137 g by mouth every morning.   - We discussed about correct intake of levothyroxine, at fasting, with water, separated by at least 30 minutes from breakfast, and separated by more than 4 hours from calcium, iron, multivitamins, acid reflux medications (PPIs). -Patient is made aware of the fact that thyroid hormone replacement is needed for life, dose to be adjusted by periodic monitoring of thyroid function tests.  6) Chronic Care/Health Maintenance:  -Patient is on ACEI/ARB and Statin medications and encouraged to continue to follow up with Ophthalmology, Podiatrist at least yearly or according to recommendations, and advised to  stay away from smoking. I have recommended yearly flu vaccine and pneumonia vaccination at least every 5 years; moderate intensity exercise for up to 150 minutes weekly; and  sleep for at least 7 hours a day.  - I advised patient to maintain close follow up with [name], MD for primary care needs.  - Time spent with the patient: 25 min, of which >50% was spent in reviewing his blood glucose logs , discussing his hypo- and hyper-glycemic episodes, reviewing his current and  previous labs and insulin doses and developing a plan to avoid hypo- and hyper-glycemia. Please refer to Patient Instructions for Blood Glucose Monitoring and Insulin/Medications Dosing Guide"  in media tab for additional  information. Paul Baker participated in the discussions, expressed understanding, and voiced agreement with the above plans.  All questions were answered to his satisfaction. he is encouraged to contact clinic should he have any questions or concerns prior to his return visit.  Follow up plan: -Return in about 1 week (around 04/10/2017) for follow up with meter and logs- no labs.  Glade Lloyd, MD Phone: [phone]  Fax: [phone]  This note was partially dictated with voice recognition software. Similar sounding words can be transcribed inadequately or may not  be corrected upon review.  04/03/2017, 1:11 PM

## 2017-04-03 NOTE — Patient Instructions (Signed)

## 2017-04-03 NOTE — Patient Instructions (Signed)
Medication Instructions:  Your physician recommends that you continue on your current medications as directed. Please refer to the Current Medication list given to you today.   Labwork: NONE  Testing/Procedures: NONE  Follow-Up: Your physician wants you to follow-up in: 6 Months with Dr. McDowell. You will receive a reminder letter in the mail two months in advance. If you don't receive a letter, please call our office to schedule the follow-up appointment.   Any Other Special Instructions Will Be Listed Below (If Applicable).     If you need a refill on your cardiac medications before your next appointment, please call your pharmacy. Thank you for choosing [facility]!    

## 2017-04-04 ENCOUNTER — Telehealth: Payer: Self-pay

## 2017-04-04 NOTE — Telephone Encounter (Signed)
Advise to increase Tresiba to 60 units , increase Humalog to 15 units TIDAC plus correction. Advise not to repeat Humalog injections shorter than 4 hours from each other.

## 2017-04-04 NOTE — Telephone Encounter (Signed)
Pt.notified

## 2017-04-04 NOTE — Telephone Encounter (Signed)
Pt states he has had high BG readings.   Date Before breakfast Before lunch Before supper Bedtime  4/2   311 287  4/3 271 - 2 hrs after 460, 484, 494                   Pt taking: Tresiba 50 units qhs, Humalog 10-16 units TIDAC

## 2017-04-10 ENCOUNTER — Encounter: Payer: Self-pay | Admitting: "Endocrinology

## 2017-04-10 ENCOUNTER — Ambulatory Visit (INDEPENDENT_AMBULATORY_CARE_PROVIDER_SITE_OTHER): Payer: Medicare Other | Admitting: "Endocrinology

## 2017-04-10 VITALS — BP 140/84 | HR 68 | Ht 65.0 in | Wt 214.0 lb

## 2017-04-10 DIAGNOSIS — I10 Essential (primary) hypertension: Secondary | ICD-10-CM

## 2017-04-10 DIAGNOSIS — E038 Other specified hypothyroidism: Secondary | ICD-10-CM | POA: Diagnosis not present

## 2017-04-10 DIAGNOSIS — I25119 Atherosclerotic heart disease of native coronary artery with unspecified angina pectoris: Secondary | ICD-10-CM | POA: Diagnosis not present

## 2017-04-10 DIAGNOSIS — E782 Mixed hyperlipidemia: Secondary | ICD-10-CM | POA: Diagnosis not present

## 2017-04-10 DIAGNOSIS — E1159 Type 2 diabetes mellitus with other circulatory complications: Secondary | ICD-10-CM

## 2017-04-10 MED ORDER — INSULIN DEGLUDEC 100 UNIT/ML ~~LOC~~ SOPN: 70.0000 [IU] | PEN_INJECTOR | Freq: Every day | SUBCUTANEOUS | 2 refills | Status: DC

## 2017-04-10 MED ORDER — INSULIN LISPRO 100 UNIT/ML (KWIKPEN): 18.0000 [IU] | PEN_INJECTOR | Freq: Three times a day (TID) | SUBCUTANEOUS | 2 refills | Status: DC

## 2017-04-10 NOTE — Progress Notes (Signed)
Subjective:    Patient ID: Paul Baker, male    DOB: [birth year], PCP [name], MD   Past Medical History:  Diagnosis Date  . Arthritis   . Asthma   . Colon polyp   . Coronary atherosclerosis of native coronary artery    Diseased nondominant RCA  . DVT (deep venous thrombosis) ([place]) 2005   Right arm  . Essential hypertension, benign   . Headache   . History of transfusion   . Hypersomnia    CPAP of 16 cm, diagnosed with AHI of 60 in 2012,[place] 21- narcolepsy?  [name] Hypothyroidism   . Mixed hyperlipidemia   . Morbid obesity ([place])   . MRSA (methicillin resistant staph aureus) culture positive    08/2012  . Narcolepsy   . OSA (obstructive sleep apnea)   . Pneumonia   . Psoriasis   . Pulmonary embolism ([place]) 2004  . RLS (restless legs syndrome)   . Rotator cuff disorder    Left  . Septic arthritis of knee, left ([place])   . Skin cancer, basal cell   . Type 2 diabetes mellitus ([place])    Past Surgical History:  Procedure Laterality Date  . BACK SURGERY    . CATARACT EXTRACTION Bilateral   . COLONOSCOPY    . CYST REMOVAL TRUNK     from back  . EYE SURGERY Left 2016   laser to left eye  . HIP SURGERY     bone removed from both sides of hip  . KNEE ARTHROTOMY Right 12/04/2014   Procedure: KNEE ARTHROTOMY PATELLA LIGAMENT RECONSTRUSION AND REPAIR RIGHT KNEE;  Surgeon: Paralee Cancel, MD;  Location: [facility];  Service: Orthopedics;  Laterality: Right;  . KNEE SURGERY     X 25 TIMES  . LUMBAR DISC SURGERY     Left L3, L4, L5 discecotomy with decompression of L4 root  . TONSILLECTOMY    . TOTAL KNEE ARTHROPLASTY  2003   LEFT  . TOTAL KNEE ARTHROPLASTY Right 03/23/2014   Procedure: RIGHT TOTAL KNEE ARTHROPLASTY AND REMOVAL RIGHT TIBIAL  DEEP IMPLANT STAPLE;  Surgeon: Paralee Cancel, MD;  Location: [facility];  Service: Orthopedics;  Laterality: Right;  . TOTAL KNEE REVISION  2005   LEFT  . WRIST SURGERY     Social History   Socioeconomic History  . Marital status:  Married    Spouse name: Toney Reil  . Number of children: 2  . Years of education: college  . Highest education level: Not on file  Occupational History  . Occupation: Disabled    Fish farm manager: UNEMPLOYED  Social Needs  . Financial resource strain: Not on file  . Food insecurity:    Worry: Not on file    Inability: Not on file  . Transportation needs:    Medical: Not on file    Non-medical: Not on file  Tobacco Use  . Smoking status: Former Smoker    Types: Cigarettes    Start date: 06/19/1967    Last attempt to quit: 01/03/1995    Years since quitting: 22.2  . Smokeless tobacco: Never Used  Substance and Sexual Activity  . Alcohol use: No    Alcohol/week: 0.0 oz    Comment: quit drinking in 07/86  . Drug use: No  . Sexual activity: Yes    Partners: Female  Lifestyle  . Physical activity:    Days per week: Not on file    Minutes per session: Not on file  . Stress: Not on  file  Relationships  . Social connections:    Talks on phone: Not on file    Gets together: Not on file    Attends religious service: Not on file    Active member of club or organization: Not on file    Attends meetings of clubs or organizations: Not on file    Relationship status: Not on file  Other Topics Concern  . Not on file  Social History Narrative    67 year old, right-handed, caucasian male with a past medical history of obesity, hypertension, hyperlipidemia, diabetes, obstructive sleep apnea, presenting with frequent nighttime awakenings, excessive daytime sleepiness, also transient confusional episodes.RLS and one beosity, OSA on CPAP with AHI of 3.2 and  setting of 16 cm water , [facility] .   Outpatient Encounter Medications as of 04/10/2017  Medication Sig  . amLODipine (NORVASC) 5 MG tablet TAKE ONE TABLET BY MOUTH ONCE DAILY.  [name] B-D ULTRAFINE III SHORT PEN 31G X 8 MM MISC USING FIVE TIMES DAILY.  . clonazePAM (KLONOPIN) 1 MG tablet TAKE (1) TABLET BY MOUTH AT BEDTIME FOR RESTLESS LEGS.  [name]  Continuous Blood Gluc Sensor (FREESTYLE LIBRE SENSOR SYSTEM) MISC Use one sensor every 10 days.  . Continuous Blood Gluc Sensor (FREESTYLE LIBRE SENSOR SYSTEM) MISC Use one sensor every 10 days.  . fenofibrate 160 MG tablet TAKE (1) TABLET BY MOUTH AT BEDTIME.  . furosemide (LASIX) 40 MG tablet Take 40 mg by mouth daily.   . insulin degludec (TRESIBA FLEXTOUCH) 100 UNIT/ML SOPN FlexTouch Pen Inject 0.7 mLs (70 Units total) into the skin daily at 10 pm.  . insulin lispro (HUMALOG KWIKPEN) 100 UNIT/ML KiwkPen Inject 0.18-0.24 mLs (18-24 Units total) into the skin 3 (three) times daily.  . isosorbide mononitrate (IMDUR) 60 MG 24 hr tablet TAKE (1/2) TABLET BY MOUTH TWICE DAILY.  [name] levothyroxine (SYNTHROID, LEVOTHROID) 137 MCG tablet TAKE (1) TABLET BY MOUTH EACH MORNING BEFORE BREAKFAST.  [name] loratadine (CLARITIN) 10 MG tablet Take 10 mg by mouth at bedtime as needed for allergies.   . metoprolol succinate (TOPROL-XL) 50 MG 24 hr tablet TAKE (1) TABLET BY MOUTH TWICE DAILY.  . modafinil (PROVIGIL) 200 MG tablet TAKE ONE TABLET BY MOUTH ONCE DAILY.  . Multiple Vitamins-Minerals (MULTIVITAMIN ADULTS 50+ PO) Take 1 tablet by mouth daily.  . nitroGLYCERIN (NITROSTAT) 0.4 MG SL tablet DISSOLVE 1 TABLET UNDER TONGUE EVERY 5 MINUTES UP TO 15 MIN FOR CHESTPAIN. IF NO RELIEF CALL 911.  [name] ONE TOUCH ULTRA TEST test strip TEST BLOOD SUGAR UP TO 4 TIMES DAILY.  [name] ONETOUCH DELICA LANCETS 35T MISC USE AS DIRECTED TO TEST BLOOD SUGAR 4 TIMES DAILY.  . pravastatin (PRAVACHOL) 20 MG tablet TAKE (1) TABLET BY MOUTH ONCE DAILY.  [name] rOPINIRole (REQUIP XL) 2 MG 24 hr tablet Take 1 tablet (2 mg total) by mouth at bedtime.  [name] rOPINIRole (REQUIP) 4 MG tablet TAKE 1/2 TABLET BY MOUTH AT LUNCH AND 1 TABLET BY MOUTH AT BEDTIME.  . sodium chloride (OCEAN) 0.65 % SOLN nasal spray Place 1-2 sprays into both nostrils daily as needed for congestion.  . Testosterone Cypionate & Prop 200-20 MG/ML SOLN Inject 1 mL into the muscle every 14  (fourteen) days.  . vitamin B-12 (CYANOCOBALAMIN) 100 MCG tablet Take 100 mcg by mouth daily.  . Vitamin D, Ergocalciferol, (DRISDOL) 50000 units CAPS capsule TAKE 1 CAPSULE BY MOUTH ONCE A WEEK.  . [DISCONTINUED] insulin degludec (TRESIBA FLEXTOUCH) 100 UNIT/ML SOPN FlexTouch Pen Inject 0.5 mLs (50 Units  total) into the skin daily at 10 pm. (Patient taking differently: Inject 60 Units into the skin daily at 10 pm. )  . [DISCONTINUED] insulin lispro (HUMALOG KWIKPEN) 100 UNIT/ML KiwkPen Inject 0.1-0.16 mLs (10-16 Units total) into the skin 3 (three) times daily. (Patient taking differently: Inject 15-21 Units into the skin 3 (three) times daily. )   No facility-administered encounter medications on file as of 04/10/2017.    ALLERGIES: Allergies  Allergen Reactions  . Cardizem [Diltiazem Hcl]     Edema   . Cardura [Doxazosin Mesylate]     Side effects  . Lipitor [Atorvastatin] Other (See Comments)    Leg cramps  . Paxil [Paroxetine Hcl]     Side effects   . Adhesive [Tape] Rash  . Codeine Rash and Other (See Comments)    Headache    VACCINATION STATUS: Immunization History  Administered Date(s) Administered  . Influenza Split 09/18/2012  . Influenza,inj,Quad PF,6+ Mos 10/13/2013, 10/06/2014  . Influenza-Unspecified 12/01/2015, 11/08/2016  . Pneumococcal Polysaccharide-23 10/02/2004, 10/03/2011  . Td 05/24/2006    Diabetes  He presents for his follow-up diabetic visit. He has type 2 diabetes mellitus. Onset time: Was diagnosed at approximate age of 45 years. His disease course has been improving. There are no hypoglycemic associated symptoms. Pertinent negatives for hypoglycemia include no confusion, headaches, pallor or seizures. Associated symptoms include polydipsia and polyuria. Pertinent negatives for diabetes include no chest pain, no fatigue, no polyphagia and no weakness. There are no hypoglycemic complications. Symptoms are improving. Diabetic complications include heart  disease. Risk factors for coronary artery disease include diabetes mellitus, dyslipidemia, male sex, obesity, sedentary lifestyle and tobacco exposure. Current diabetic treatment includes intensive insulin program. He is compliant with treatment most of the time. His weight is stable. He is following a generally unhealthy diet. He has had a previous visit with a dietitian. He never participates in exercise. His home blood glucose trend is fluctuating dramatically. His breakfast blood glucose range is generally 180-200 mg/dl. His lunch blood glucose range is generally 180-200 mg/dl. His dinner blood glucose range is generally 180-200 mg/dl. His bedtime blood glucose range is generally 180-200 mg/dl. His overall blood glucose range is 180-200 mg/dl. (His recent labs show A1c of 7.8%.) An ACE inhibitor/angiotensin II receptor blocker is being taken. He sees a podiatrist.Eye exam is current.  Hyperlipidemia  This is a chronic problem. The current episode started more than 1 year ago. The problem is controlled. Exacerbating diseases include diabetes, hypothyroidism and obesity. Pertinent negatives include no chest pain, myalgias or shortness of breath. Current antihyperlipidemic treatment includes statins. Risk factors for coronary artery disease include dyslipidemia, diabetes mellitus, hypertension, male sex, obesity and a sedentary lifestyle.  Hypertension  This is a chronic problem. The current episode started more than 1 year ago. The problem is controlled. Pertinent negatives include no chest pain, headaches, neck pain, palpitations or shortness of breath. Risk factors for coronary artery disease include diabetes mellitus, dyslipidemia, male gender, obesity and sedentary lifestyle. Past treatments include angiotensin blockers. Hypertensive end-organ damage includes CAD/MI. Identifiable causes of hypertension include a thyroid problem.  Thyroid Problem  Presents for follow-up visit. Patient reports no  constipation, diarrhea, fatigue or palpitations. The symptoms have been improving. Past treatments include levothyroxine. His past medical history is significant for diabetes and hyperlipidemia.     Review of Systems  Constitutional: Negative for fatigue and unexpected weight change.  HENT: Negative for dental problem, mouth sores and trouble swallowing.   Eyes: Negative for visual disturbance.  Respiratory: Negative for cough, choking, chest tightness, shortness of breath and wheezing.   Cardiovascular: Negative for chest pain, palpitations and leg swelling.  Gastrointestinal: Negative for abdominal distention, abdominal pain, constipation, diarrhea, nausea and vomiting.  Endocrine: Positive for polydipsia and polyuria. Negative for polyphagia.  Genitourinary: Negative for dysuria, flank pain, hematuria and urgency.  Musculoskeletal: Negative for back pain, gait problem, myalgias and neck pain.  Skin: Negative for pallor, rash and wound.  Neurological: Negative for seizures, syncope, weakness, numbness and headaches.  Psychiatric/Behavioral: Negative.  Negative for confusion and dysphoric mood.    Objective:    BP 140/84   Pulse 68   Ht 5\' 5"  (1.651 m)   Wt 214 lb (97.1 kg)   BMI 35.61 kg/m   Wt Readings from Last 3 Encounters:  04/10/17 214 lb (97.1 kg)  04/03/17 215 lb (97.5 kg)  04/03/17 216 lb (98 kg)    Physical Exam  Constitutional: He is oriented to person, place, and time. He appears well-developed. He is cooperative. No distress.  HENT:  Head: Normocephalic and atraumatic.  Eyes: EOM are normal.  Neck: Normal range of motion. Neck supple. No tracheal deviation present. No thyromegaly present.  Cardiovascular: Normal rate, S1 normal and S2 normal. Exam reveals no gallop.  No murmur heard. Pulses:      Dorsalis pedis pulses are 1+ on the right side, and 1+ on the left side.       Posterior tibial pulses are 1+ on the right side, and 1+ on the left side.   Pulmonary/Chest: Effort normal. No respiratory distress. He has no wheezes.  Abdominal: He exhibits no distension. There is no tenderness. There is no guarding and no CVA tenderness.  Musculoskeletal: He exhibits no edema.       Right shoulder: He exhibits no swelling and no deformity.  Neurological: He is alert and oriented to person, place, and time. He has normal strength and normal reflexes. No cranial nerve deficit or sensory deficit. Gait normal.  Skin: Skin is warm and dry. No rash noted. No cyanosis. Nails show no clubbing.  Psychiatric: He has a normal mood and affect. His speech is normal. Judgment normal. Cognition and memory are normal.    Results for orders placed or performed during the hospital encounter of 03/29/17  MRSA PCR Screening  Result Value Ref Range   MRSA by PCR NEGATIVE NEGATIVE  Basic metabolic panel  Result Value Ref Range   Sodium 139 135 - 145 mmol/L   Potassium 3.6 3.5 - 5.1 mmol/L   Chloride 101 101 - 111 mmol/L   CO2 26 22 - 32 mmol/L   Glucose, Bld 71 65 - 99 mg/dL   BUN 39 (H) 6 - 20 mg/dL   Creatinine, Ser 2.01 (H) 0.61 - 1.24 mg/dL   Calcium 9.9 8.9 - 10.3 mg/dL   GFR calc non Af Amer 33 (L) >60 mL/min   GFR calc Af Amer 38 (L) >60 mL/min   Anion gap 12 5 - 15  CBC  Result Value Ref Range   WBC 8.0 4.0 - 10.5 K/uL   RBC 4.14 (L) 4.22 - 5.81 MIL/uL   Hemoglobin 12.4 (L) 13.0 - 17.0 g/dL   HCT 38.2 (L) 39.0 - 52.0 %   MCV 92.3 78.0 - 100.0 fL   MCH 30.0 26.0 - 34.0 pg   MCHC 32.5 30.0 - 36.0 g/dL   RDW 13.6 11.5 - 15.5 %   Platelets 245 150 - 400 K/uL  D-dimer, quantitative (  not at [facility])  Result Value Ref Range   D-Dimer, Quant 0.33 0.00 - 0.50 ug/mL-FEU  HIV antibody (Routine Testing)  Result Value Ref Range   HIV Screen 4th Generation wRfx Non Reactive Non Reactive  CBC  Result Value Ref Range   WBC 7.4 4.0 - 10.5 K/uL   RBC 3.79 (L) 4.22 - 5.81 MIL/uL   Hemoglobin 11.5 (L) 13.0 - 17.0 g/dL   HCT 35.3 (L) 39.0 - 52.0 %   MCV  93.1 78.0 - 100.0 fL   MCH 30.3 26.0 - 34.0 pg   MCHC 32.6 30.0 - 36.0 g/dL   RDW 13.6 11.5 - 15.5 %   Platelets 216 150 - 400 K/uL  Creatinine, serum  Result Value Ref Range   Creatinine, Ser 1.71 (H) 0.61 - 1.24 mg/dL   GFR calc non Af Amer 40 (L) >60 mL/min   GFR calc Af Amer 47 (L) >60 mL/min  Troponin I  Result Value Ref Range   Troponin I <0.03 <0.03 ng/mL  Troponin I  Result Value Ref Range   Troponin I <0.03 <0.03 ng/mL  Troponin I  Result Value Ref Range   Troponin I <0.03 <0.03 ng/mL  Hemoglobin A1c  Result Value Ref Range   Hgb A1c MFr Bld 7.8 (H) 4.8 - 5.6 %   Mean Plasma Glucose 177.16 mg/dL  C-peptide  Result Value Ref Range   C-Peptide <0.1 (L) 1.1 - 4.4 ng/mL  Glucose, capillary  Result Value Ref Range   Glucose-Capillary 87 65 - 99 mg/dL  Glucose, capillary  Result Value Ref Range   Glucose-Capillary 184 (H) 65 - 99 mg/dL   Comment 1 Document in Chart   Glucose, capillary  Result Value Ref Range   Glucose-Capillary 307 (H) 65 - 99 mg/dL   Comment 1 Document in Chart   Glucose, capillary  Result Value Ref Range   Glucose-Capillary 443 (H) 65 - 99 mg/dL  Glucose, capillary  Result Value Ref Range   Glucose-Capillary 413 (H) 65 - 99 mg/dL  Basic metabolic panel  Result Value Ref Range   Sodium 140 135 - 145 mmol/L   Potassium 4.1 3.5 - 5.1 mmol/L   Chloride 106 101 - 111 mmol/L   CO2 24 22 - 32 mmol/L   Glucose, Bld 175 (H) 65 - 99 mg/dL   BUN 26 (H) 6 - 20 mg/dL   Creatinine, Ser 1.12 0.61 - 1.24 mg/dL   Calcium 8.8 (L) 8.9 - 10.3 mg/dL   GFR calc non Af Amer >60 >60 mL/min   GFR calc Af Amer >60 >60 mL/min   Anion gap 10 5 - 15  CBC  Result Value Ref Range   WBC 4.5 4.0 - 10.5 K/uL   RBC 3.91 (L) 4.22 - 5.81 MIL/uL   Hemoglobin 11.7 (L) 13.0 - 17.0 g/dL   HCT 36.1 (L) 39.0 - 52.0 %   MCV 92.3 78.0 - 100.0 fL   MCH 29.9 26.0 - 34.0 pg   MCHC 32.4 30.0 - 36.0 g/dL   RDW 13.4 11.5 - 15.5 %   Platelets 214 150 - 400 K/uL  Glucose,  capillary  Result Value Ref Range   Glucose-Capillary 287 (H) 65 - 99 mg/dL  Glucose, capillary  Result Value Ref Range   Glucose-Capillary 243 (H) 65 - 99 mg/dL   Comment 1 Notify RN    Comment 2 Document in Chart   Glucose, capillary  Result Value Ref Range   Glucose-Capillary 286 (H) 65 -  99 mg/dL   Comment 1 Notify RN    Comment 2 Document in Chart   Glucose, capillary  Result Value Ref Range   Glucose-Capillary 251 (H) 65 - 99 mg/dL   Comment 1 Notify RN    Comment 2 Document in Chart   Glucose, capillary  Result Value Ref Range   Glucose-Capillary 228 (H) 65 - 99 mg/dL   Comment 1 Notify RN    Comment 2 Document in Chart   Glucose, capillary  Result Value Ref Range   Glucose-Capillary 167 (H) 65 - 99 mg/dL   Comment 1 Notify RN    Comment 2 Document in Chart   Glucose, capillary  Result Value Ref Range   Glucose-Capillary 184 (H) 65 - 99 mg/dL  Glucose, capillary  Result Value Ref Range   Glucose-Capillary 195 (H) 65 - 99 mg/dL   Comment 1 Notify RN    Comment 2 Document in Chart   I-stat troponin, ED  Result Value Ref Range   Troponin i, poc 0.00 0.00 - 0.08 ng/mL   Comment 3          POC CBG, ED  Result Value Ref Range   Glucose-Capillary 77 65 - 99 mg/dL  CBG monitoring, ED  Result Value Ref Range   Glucose-Capillary 64 (L) 65 - 99 mg/dL   Diabetic Labs (most recent): Lab Results  Component Value Date   HGBA1C 7.8 (H) 03/30/2017   HGBA1C 9.1 (H) 12/21/2016   HGBA1C 9.2 (H) 09/05/2016   Lipid Panel     Component Value Date/Time   CHOL 148 08/19/2015 0914   TRIG 77 08/19/2015 0914   HDL 53 08/19/2015 0914   CHOLHDL 2.8 08/19/2015 0914   CHOLHDL 4.3 11/13/2013 0843   VLDL 31 11/13/2013 0843   LDLCALC 80 08/19/2015 0914    Assessment & Plan:   1. Uncontrolled type 2 diabetes mellitus with other circulatory complication, with long-term current use of insulin (HCC)  -His diabetes is  complicated by ordinary artery disease and patient remains at  a high risk for more acute and chronic complications of diabetes which include CAD, CVA, CKD, retinopathy, and neuropathy. These are all discussed in detail with the patient.  -He denies overdosing insulin or taking any other anti-diabetes medications.    -He returns with better blood glucose profile on intensive treatment with basal/bolus insulin.   His recent labs show A1c of 7.8% improving from 9.1%.     Glucose logs and insulin administration records pertaining to this visit,  to be scanned into patient's records.  Recent labs reviewed.   - I have re-counseled the patient on diet management and weight loss  by adopting a carbohydrate restricted / protein rich  Diet.  -  Suggestion is made for him to avoid simple carbohydrates  from his diet including Cakes, Sweet Desserts / Pastries, Ice Cream, Soda (diet and regular), Sweet Tea, Candies, Chips, Cookies, Store Bought Juices, Alcohol in Excess of  1-2 drinks a day, Artificial Sweeteners, and "Sugar-free" Products. This will help patient to have stable blood glucose profile and potentially avoid unintended weight gain.  - Patient is advised to stick to a routine mealtimes to eat 3 meals  a day and avoid unnecessary snacks ( to snack only to correct hypoglycemia).   - I have approached patient with the following individualized plan to manage diabetes and patient agrees.  -He did relatively better on basal/bolus insulin regimen.  -I discussed and increased Tresiba to 70 units nightly,  increase Humalog to 18-24 units 3 times daily before meals for pre-meal blood glucose  above 90 mg/dL.  - He is currently using CGM, advised to continue document blood glucose at least 4 times a day-before meals and at bedtime.   -Patient is encouraged to call clinic for blood glucose levels less than 70 or above 200 mg /dl x 3. - I advised him to stay off of metformin for now.    - Patient specific target  for A1c; LDL, HDL, Triglycerides, and  Waist  Circumference were discussed in detail.  2) BP/HTN: His blood pressure is controlled to target.   I have advised him to continue his current medications including ACEI/ARB. 3) Lipids/HPL:  continue statins. 4)  Weight/Diet: CDE consult in progress, exercise, and carbohydrates information provided.  5) hypothyroidism - His recent thyroid function tests are consistent with appropriate replacement. I advised him to continue levothyroxine 137 g by mouth every morning.   - We discussed about correct intake of levothyroxine, at fasting, with water, separated by at least 30 minutes from breakfast, and separated by more than 4 hours from calcium, iron, multivitamins, acid reflux medications (PPIs). -Patient is made aware of the fact that thyroid hormone replacement is needed for life, dose to be adjusted by periodic monitoring of thyroid function tests. 6) Chronic Care/Health Maintenance:  -Patient is on ACEI/ARB and Statin medications and encouraged to continue to follow up with Ophthalmology, Podiatrist at least yearly or according to recommendations, and advised to  stay away from smoking. I have recommended yearly flu vaccine and pneumonia vaccination at least every 5 years; moderate intensity exercise for up to 150 minutes weekly; and  sleep for at least 7 hours a day.  - I advised patient to maintain close follow up with [name], MD for primary care needs.  - Time spent with the patient: 25 min, of which >50% was spent in reviewing his blood glucose logs , discussing his hypo- and hyper-glycemic episodes, reviewing his current and  previous labs and insulin doses and developing a plan to avoid hypo- and hyper-glycemia. Please refer to Patient Instructions for Blood Glucose Monitoring and Insulin/Medications Dosing Guide"  in media tab for additional information. Paul Baker participated in the discussions, expressed understanding, and voiced agreement with the above plans.  All  questions were answered to his satisfaction. he is encouraged to contact clinic should he have any questions or concerns prior to his return visit.   Follow up plan: -Return in about 3 months (around 07/10/2017) for follow up with pre-visit labs, meter, and logs.  Glade Lloyd, MD Phone: [phone]  Fax: [phone]  This note was partially dictated with voice recognition software. Similar sounding words can be transcribed inadequately or may not  be corrected upon review.  04/10/2017, 3:01 PM

## 2017-04-10 NOTE — Patient Instructions (Signed)

## 2017-04-11 ENCOUNTER — Ambulatory Visit: Payer: Medicare Other | Admitting: "Endocrinology

## 2017-04-19 ENCOUNTER — Other Ambulatory Visit: Payer: Self-pay

## 2017-04-19 MED ORDER — FREESTYLE LIBRE 14 DAY SENSOR MISC: 1.0000 | 3 refills | Status: DC

## 2017-04-19 NOTE — Telephone Encounter (Signed)
We will consider one of these options next visit.

## 2017-04-19 NOTE — Telephone Encounter (Signed)
Pt states that his insulin has an extremely high copay of tresiba $500 humalog $500 He is requesting to get Novolin R, Novolin 70/30, or Novolin N instead. He states he checked with his insurance and these were the cheapest. He has enough until next week

## 2017-04-20 ENCOUNTER — Other Ambulatory Visit: Payer: Self-pay

## 2017-04-20 MED ORDER — FENOFIBRATE 160 MG PO TABS: ORAL_TABLET | ORAL | 0 refills | Status: DC

## 2017-04-24 ENCOUNTER — Other Ambulatory Visit: Payer: Self-pay

## 2017-04-24 MED ORDER — "INSULIN SYRINGE 31G X 5/16"" 1 ML MISC": 1.0000 | Freq: Two times a day (BID) | 2 refills | Status: DC

## 2017-04-24 MED ORDER — INSULIN NPH ISOPHANE & REGULAR (70-30) 100 UNIT/ML ~~LOC~~ SUSP: 60.0000 [IU] | Freq: Two times a day (BID) | SUBCUTANEOUS | 2 refills | Status: DC

## 2017-04-24 NOTE — Telephone Encounter (Signed)
How many units of Novolin 70/30

## 2017-04-24 NOTE — Telephone Encounter (Signed)
Novolin 70/30 60 units twice daily with breakfast and supper when pre-meal blood glucose readings are above 90 mg/dL.  This is a starting dose, he may need adjustment.  Advised him to call clinic for blood glucose readings greater than 200 x 3.

## 2017-04-24 NOTE — Telephone Encounter (Signed)
Pt states he cannot afford the insulin prescribed. He has samples now but will run out soon. His next appt is 07-10-17

## 2017-04-24 NOTE — Addendum Note (Signed)
Addended by: Lavell Luster A on: 04/24/2017 04:30 PM   Modules accepted: Orders

## 2017-04-24 NOTE — Telephone Encounter (Signed)
I believe he has to fill out one of the patient assistance forms. In the meantime we will prescribe Novolin 70/30.

## 2017-05-07 ENCOUNTER — Other Ambulatory Visit: Payer: Self-pay

## 2017-05-07 MED ORDER — FREESTYLE LIBRE 14 DAY SENSOR MISC: 1.0000 | 5 refills | Status: DC

## 2017-05-07 MED ORDER — FREESTYLE LIBRE 14 DAY READER DEVI: 1.0000 | 0 refills | Status: DC

## 2017-05-10 ENCOUNTER — Other Ambulatory Visit: Payer: Self-pay

## 2017-05-10 MED ORDER — FREESTYLE LIBRE 14 DAY SENSOR MISC: 1.0000 | 5 refills | Status: DC

## 2017-05-20 ENCOUNTER — Other Ambulatory Visit: Payer: Self-pay | Admitting: "Endocrinology

## 2017-05-20 ENCOUNTER — Other Ambulatory Visit (HOSPITAL_COMMUNITY): Payer: Self-pay | Admitting: Family Medicine

## 2017-05-30 ENCOUNTER — Ambulatory Visit: Payer: Medicare Other | Admitting: Adult Health

## 2017-06-04 ENCOUNTER — Other Ambulatory Visit: Payer: Self-pay | Admitting: Neurology

## 2017-06-04 DIAGNOSIS — G2581 Restless legs syndrome: Secondary | ICD-10-CM

## 2017-06-13 DIAGNOSIS — D631 Anemia in chronic kidney disease: Secondary | ICD-10-CM | POA: Diagnosis not present

## 2017-06-13 DIAGNOSIS — R42 Dizziness and giddiness: Secondary | ICD-10-CM | POA: Diagnosis not present

## 2017-06-13 DIAGNOSIS — Z6836 Body mass index (BMI) 36.0-36.9, adult: Secondary | ICD-10-CM | POA: Diagnosis not present

## 2017-06-13 DIAGNOSIS — I129 Hypertensive chronic kidney disease with stage 1 through stage 4 chronic kidney disease, or unspecified chronic kidney disease: Secondary | ICD-10-CM | POA: Diagnosis not present

## 2017-06-13 DIAGNOSIS — E1122 Type 2 diabetes mellitus with diabetic chronic kidney disease: Secondary | ICD-10-CM | POA: Diagnosis not present

## 2017-06-13 DIAGNOSIS — M549 Dorsalgia, unspecified: Secondary | ICD-10-CM | POA: Diagnosis not present

## 2017-06-13 DIAGNOSIS — N182 Chronic kidney disease, stage 2 (mild): Secondary | ICD-10-CM | POA: Diagnosis not present

## 2017-06-22 ENCOUNTER — Other Ambulatory Visit: Payer: Self-pay | Admitting: Neurology

## 2017-06-30 IMAGING — MR MR KNEE*R* W/O CM
10 series · 39 of 40 positions shown · non-contrast
Comparison: None.

CLINICAL DATA: Right knee arthroplasty. Injured 2 months ago.
Evaluate patellar tendon.

EXAM:
MRI OF THE RIGHT KNEE WITHOUT CONTRAST
TECHNIQUE: Multiplanar, multisequence MR imaging of the knee was performed. No
intravenous contrast was administered.

[Series 6: PD · axial · 4.5mm · 0.31mm/px · z∈[-72,+89]mm · 6 of 31 slices shown (1 of 2)]
[im 1/31]
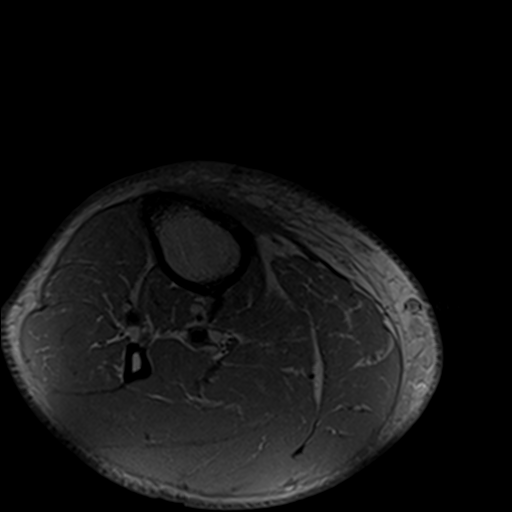
[im 7/31]
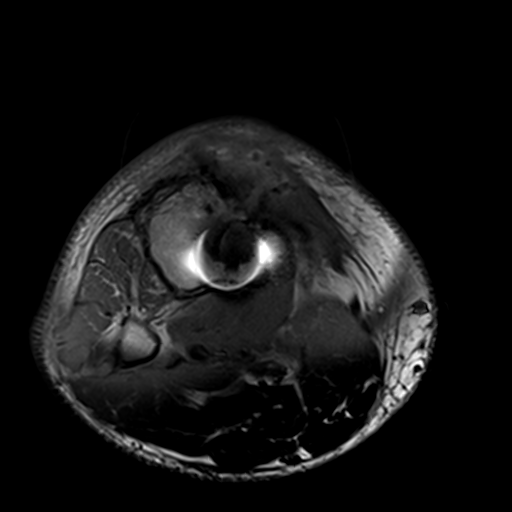
[im 13/31]
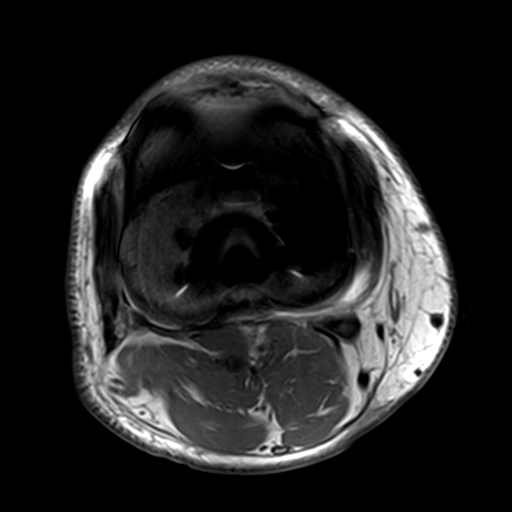
[im 19/31]
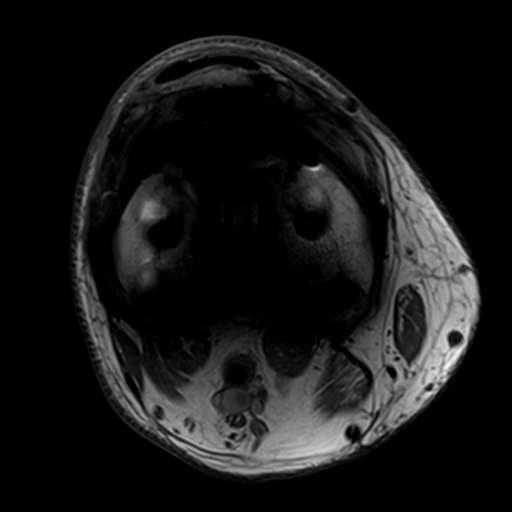
[im 25/31]
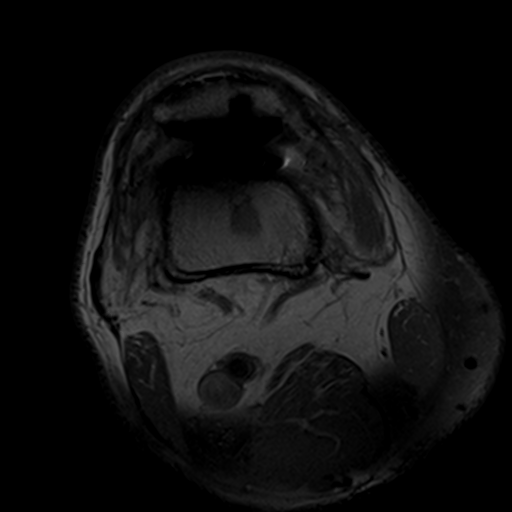
[im 31/31]
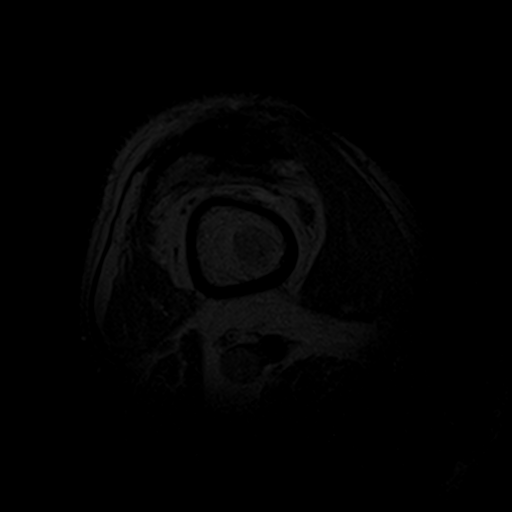

[Series 7: PD · coronal · 4.0mm · 0.66mm/px · 3 of 22 slices shown (2 of 2)]
[im 1/22]
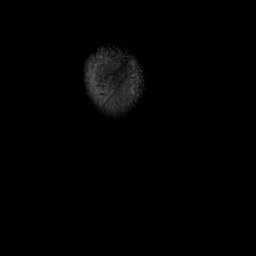
[im 11/22]
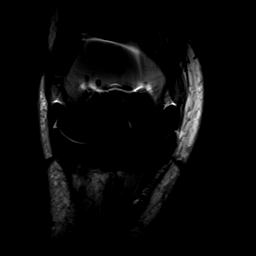
[im 22/22]
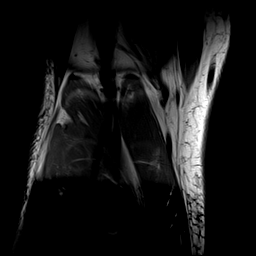

[Series 8: STIR · coronal · 4.0mm · 0.70mm/px · 3 of 22 slices shown (1 of 3)]
[im 1/22]
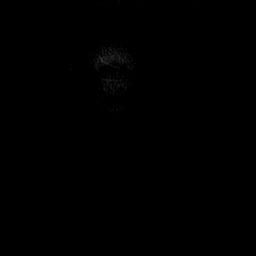
[im 11/22]
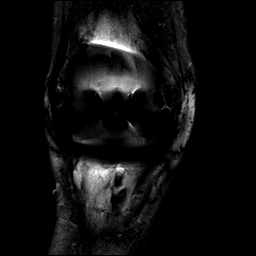
[im 22/22]
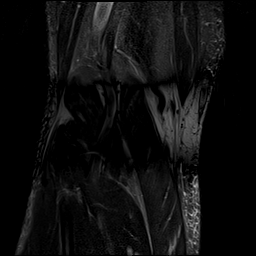

[Series 9: PD fat-sat · sagittal · 4.0mm · 0.70mm/px · 4 of 24 slices shown]
[im 1/24]
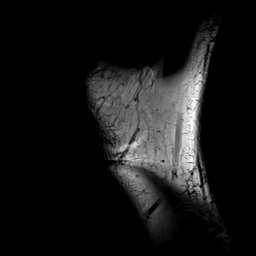
[im 8/24]
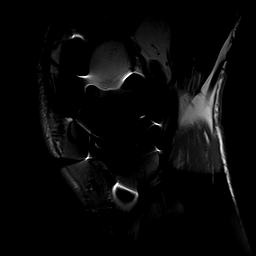
[im 16/24]
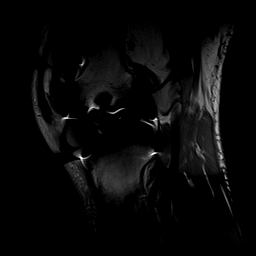
[im 24/24]
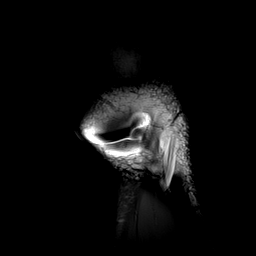

[Series 10: STIR · axial · 4.5mm · 0.49mm/px · z∈[-71,+90]mm · 5 of 31 slices shown (2 of 3)]
[im 1/31]
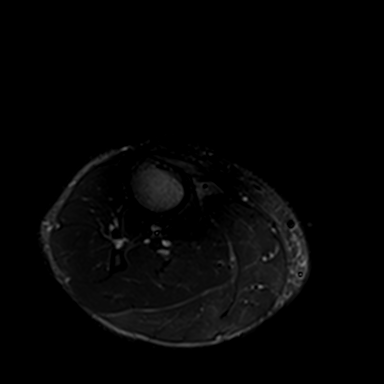
[im 8/31]
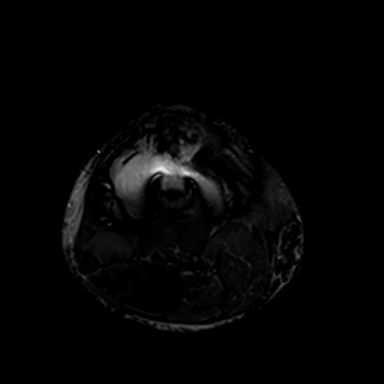
[im 16/31]
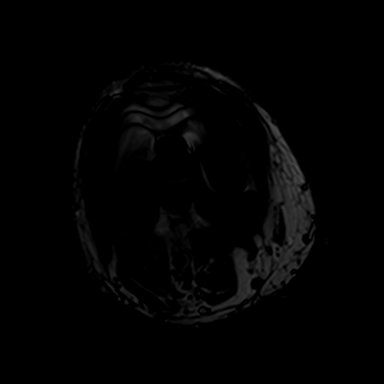
[im 23/31]
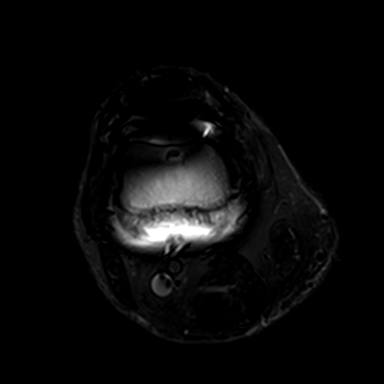
[im 31/31]
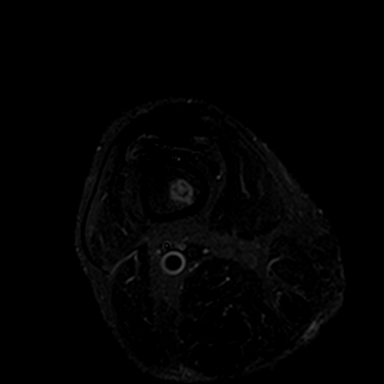

[Series 11: (id) · coronal · 4.0mm · 0.66mm/px · 3 of 22 slices shown]
[im 1/22]
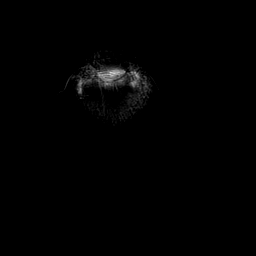
[im 11/22]
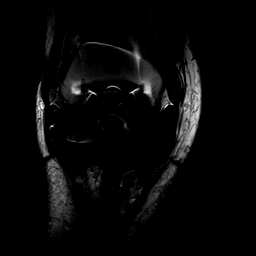
[im 22/22]
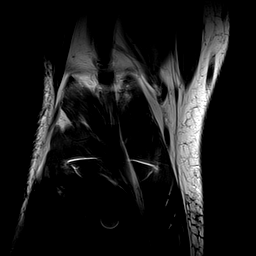

[Series 12: T1 · coronal · 4.0mm · 0.66mm/px · 3 of 22 slices shown]
[im 1/22]
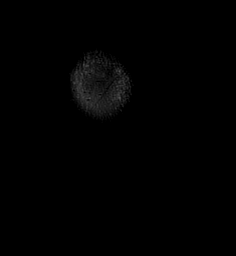
[im 11/22]
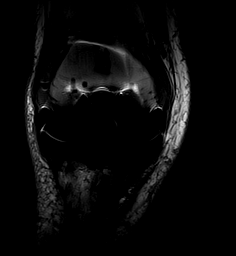
[im 22/22]
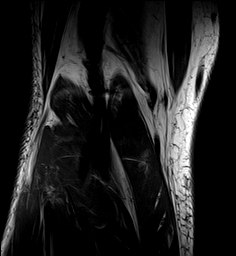

[Series 13: STIR · sagittal · 4.0mm · 0.70mm/px · 4 of 24 slices shown (3 of 3)]
[im 1/24]
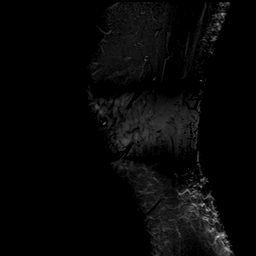
[im 8/24]
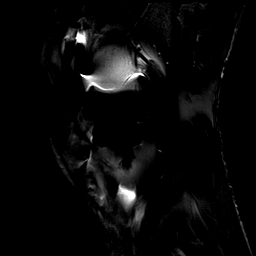
[im 16/24]
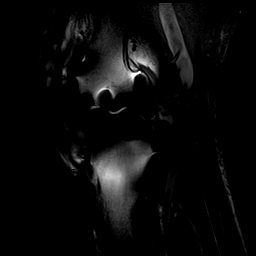
[im 24/24]
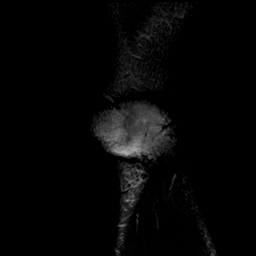

[Series 14: T2 · axial · 4.5mm · 0.74mm/px · z∈[-71,+90]mm · 5 of 31 slices shown (1 of 2)]
[im 1/31]
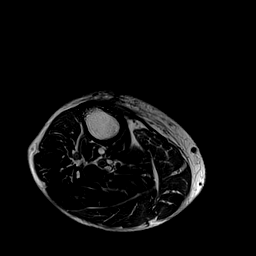
[im 8/31]
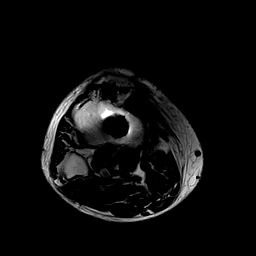
[im 16/31]
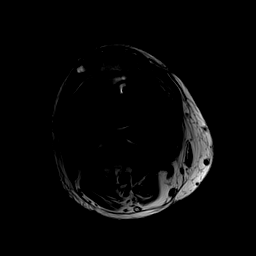
[im 23/31]
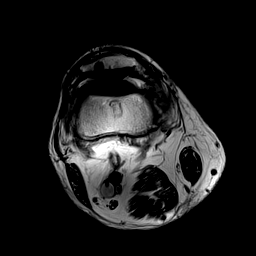
[im 31/31]
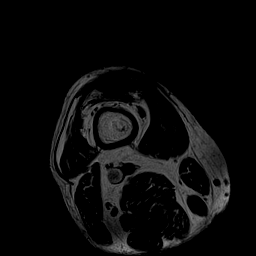

[Series 15: T2 · sagittal · 4.0mm · 0.39mm/px · 3 of 24 slices shown (2 of 2)]
[im 1/24]
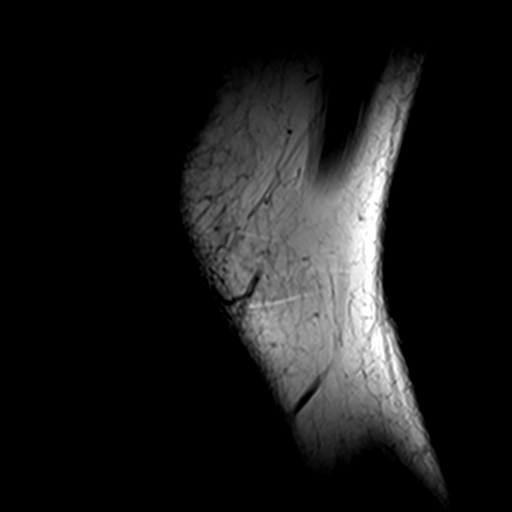
[im 8/24]
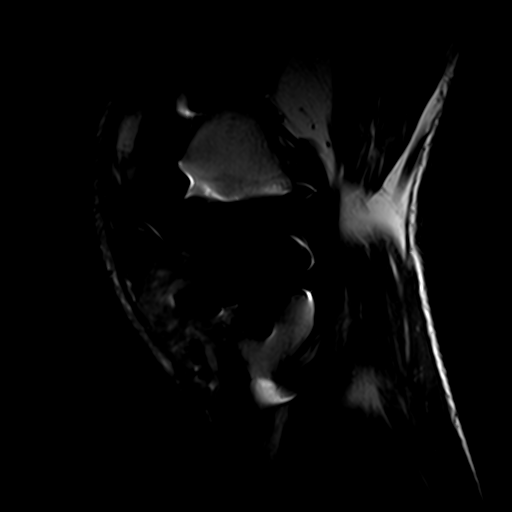
[im 16/24]
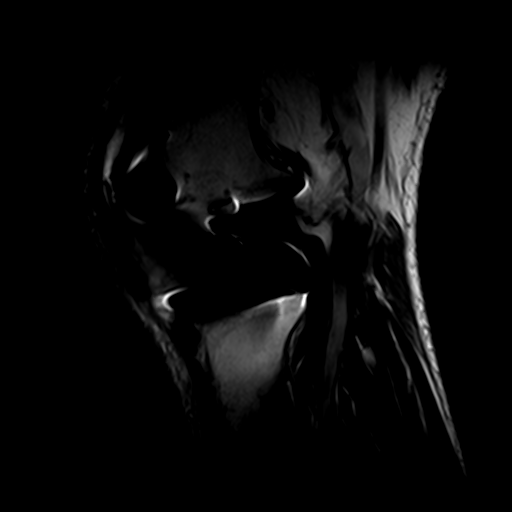

[39 of 40 positions shown; findings below may reference images not displayed]

FINDINGS: Right total knee arthroplasty. Severe susceptibility artifact
resulting from the arthroplasty obscuring the adjacent soft tissue
and osseous structures.

The alignment is anatomic. There is a high-riding patella. There is
near complete tear of the patellar tendon with a very thin portion
of the patellar tendon remaining. Intact quadriceps tendon.

The visualized muscles are normal in signal. There is no muscle
atrophy. There is no focal fluid collection or hematoma. The
neurovascular structures are normal.

No osteolysis.  No periarticular fluid collection.
IMPRESSION: 1. High-riding patella with a near complete tear of the patellar
tendon with a very thin portion of the patellar tendon remaining.

## 2017-07-03 DIAGNOSIS — E1159 Type 2 diabetes mellitus with other circulatory complications: Secondary | ICD-10-CM | POA: Diagnosis not present

## 2017-07-04 LAB — COMPLETE METABOLIC PANEL WITH GFR
AG RATIO: 1.7 (calc) (ref 1.0–2.5)
ALBUMIN MSPROF: 4.3 g/dL (ref 3.6–5.1)
ALKALINE PHOSPHATASE (APISO): 55 U/L (ref 40–115)
ALT: 28 U/L (ref 9–46)
AST: 28 U/L (ref 10–35)
BILIRUBIN TOTAL: 0.3 mg/dL (ref 0.2–1.2)
BUN: 25 mg/dL (ref 7–25)
CHLORIDE: 105 mmol/L (ref 98–110)
CO2: 28 mmol/L (ref 20–32)
Calcium: 9.4 mg/dL (ref 8.6–10.3)
Creat: 1.08 mg/dL (ref 0.70–1.25)
GFR, EST AFRICAN AMERICAN: 82 mL/min/{1.73_m2} (ref >=60)
GFR, Est Non African American: 71 mL/min/{1.73_m2} (ref >=60)
GLOBULIN: 2.5 g/dL (ref 1.9–3.7)
Glucose, Bld: 314 mg/dL — ABNORMAL HIGH (ref 65–99)
Potassium: 4.4 mmol/L (ref 3.5–5.3)
SODIUM: 140 mmol/L (ref 135–146)
TOTAL PROTEIN: 6.8 g/dL (ref 6.1–8.1)

## 2017-07-04 LAB — HEMOGLOBIN A1C
Hgb A1c MFr Bld: 8.5 % of total Hgb — ABNORMAL HIGH (ref <5.7)
MEAN PLASMA GLUCOSE: 197 (calc)
eAG (mmol/L): 10.9 (calc)

## 2017-07-10 ENCOUNTER — Ambulatory Visit (INDEPENDENT_AMBULATORY_CARE_PROVIDER_SITE_OTHER): Payer: Medicare Other | Admitting: "Endocrinology

## 2017-07-10 ENCOUNTER — Encounter: Payer: Self-pay | Admitting: "Endocrinology

## 2017-07-10 VITALS — BP 138/77 | HR 63 | Ht 65.0 in | Wt 213.0 lb

## 2017-07-10 DIAGNOSIS — E038 Other specified hypothyroidism: Secondary | ICD-10-CM | POA: Diagnosis not present

## 2017-07-10 DIAGNOSIS — E1159 Type 2 diabetes mellitus with other circulatory complications: Secondary | ICD-10-CM

## 2017-07-10 DIAGNOSIS — I25119 Atherosclerotic heart disease of native coronary artery with unspecified angina pectoris: Secondary | ICD-10-CM

## 2017-07-10 DIAGNOSIS — I10 Essential (primary) hypertension: Secondary | ICD-10-CM

## 2017-07-10 DIAGNOSIS — E782 Mixed hyperlipidemia: Secondary | ICD-10-CM | POA: Diagnosis not present

## 2017-07-10 MED ORDER — INSULIN DEGLUDEC 100 UNIT/ML ~~LOC~~ SOPN: 80.0000 [IU] | PEN_INJECTOR | Freq: Every day | SUBCUTANEOUS | 1 refills | Status: DC

## 2017-07-10 MED ORDER — INSULIN LISPRO 100 UNIT/ML (KWIKPEN): 20.0000 [IU] | PEN_INJECTOR | Freq: Three times a day (TID) | SUBCUTANEOUS | 2 refills | Status: DC

## 2017-07-10 NOTE — Patient Instructions (Signed)

## 2017-07-10 NOTE — Progress Notes (Signed)
Subjective:    Patient ID: Paul Baker, male    DOB: [birth year], PCP [name], MD   Past Medical History:  Diagnosis Date  . Arthritis   . Asthma   . Colon polyp   . Coronary atherosclerosis of native coronary artery    Diseased nondominant RCA  . DVT (deep venous thrombosis) ([place]) 2005   Right arm  . Essential hypertension, benign   . Headache   . History of transfusion   . Hypersomnia    CPAP of 16 cm, diagnosed with AHI of 60 in 2012,[place] 21- narcolepsy?  [name] Hypothyroidism   . Mixed hyperlipidemia   . Morbid obesity ([place])   . MRSA (methicillin resistant staph aureus) culture positive    08/2012  . Narcolepsy   . OSA (obstructive sleep apnea)   . Pneumonia   . Psoriasis   . Pulmonary embolism ([place]) 2004  . RLS (restless legs syndrome)   . Rotator cuff disorder    Left  . Septic arthritis of knee, left ([place])   . Skin cancer, basal cell   . Type 2 diabetes mellitus ([place])    Past Surgical History:  Procedure Laterality Date  . BACK SURGERY    . CATARACT EXTRACTION Bilateral   . COLONOSCOPY    . CYST REMOVAL TRUNK     from back  . EYE SURGERY Left 2016   laser to left eye  . HIP SURGERY     bone removed from both sides of hip  . KNEE ARTHROTOMY Right 12/04/2014   Procedure: KNEE ARTHROTOMY PATELLA LIGAMENT RECONSTRUSION AND REPAIR RIGHT KNEE;  Surgeon: Paralee Cancel, MD;  Location: [facility];  Service: Orthopedics;  Laterality: Right;  . KNEE SURGERY     X 25 TIMES  . LUMBAR DISC SURGERY     Left L3, L4, L5 discecotomy with decompression of L4 root  . TONSILLECTOMY    . TOTAL KNEE ARTHROPLASTY  2003   LEFT  . TOTAL KNEE ARTHROPLASTY Right 03/23/2014   Procedure: RIGHT TOTAL KNEE ARTHROPLASTY AND REMOVAL RIGHT TIBIAL  DEEP IMPLANT STAPLE;  Surgeon: Paralee Cancel, MD;  Location: [facility];  Service: Orthopedics;  Laterality: Right;  . TOTAL KNEE REVISION  2005   LEFT  . WRIST SURGERY     Social History   Socioeconomic History  . Marital status:  Married    Spouse name: Toney Reil  . Number of children: 2  . Years of education: college  . Highest education level: Not on file  Occupational History  . Occupation: Disabled    Fish farm manager: UNEMPLOYED  Social Needs  . Financial resource strain: Not on file  . Food insecurity:    Worry: Not on file    Inability: Not on file  . Transportation needs:    Medical: Not on file    Non-medical: Not on file  Tobacco Use  . Smoking status: Former Smoker    Types: Cigarettes    Start date: 06/19/1967    Last attempt to quit: 01/03/1995    Years since quitting: 22.5  . Smokeless tobacco: Never Used  Substance and Sexual Activity  . Alcohol use: No    Alcohol/week: 0.0 oz    Comment: quit drinking in 07/86  . Drug use: No  . Sexual activity: Yes    Partners: Female  Lifestyle  . Physical activity:    Days per week: Not on file    Minutes per session: Not on file  . Stress: Not on  file  Relationships  . Social connections:    Talks on phone: Not on file    Gets together: Not on file    Attends religious service: Not on file    Active member of club or organization: Not on file    Attends meetings of clubs or organizations: Not on file    Relationship status: Not on file  Other Topics Concern  . Not on file  Social History Narrative    67 year old, right-handed, caucasian male with a past medical history of obesity, hypertension, hyperlipidemia, diabetes, obstructive sleep apnea, presenting with frequent nighttime awakenings, excessive daytime sleepiness, also transient confusional episodes.RLS and one beosity, OSA on CPAP with AHI of 3.2 and  setting of 16 cm water , [facility] .   Outpatient Encounter Medications as of 07/10/2017  Medication Sig  . amLODipine (NORVASC) 5 MG tablet TAKE ONE TABLET BY MOUTH ONCE DAILY.  [name] B-D ULTRAFINE III SHORT PEN 31G X 8 MM MISC USING FIVE TIMES DAILY.  . clonazePAM (KLONOPIN) 1 MG tablet TAKE (1) TABLET BY MOUTH AT BEDTIME FOR RESTLESS LEGS.  .  fenofibrate 160 MG tablet TAKE 1 TABLET BY MOUTH AT BEDTIME - NEEDS OFFICE VISIT  . furosemide (LASIX) 40 MG tablet Take 40 mg by mouth daily.   . insulin degludec (TRESIBA FLEXTOUCH) 100 UNIT/ML SOPN FlexTouch Pen Inject 0.8 mLs (80 Units total) into the skin daily at 10 pm.  . insulin lispro (HUMALOG KWIKPEN) 100 UNIT/ML KiwkPen Inject 0.2-0.26 mLs (20-26 Units total) into the skin 3 (three) times daily.  . Insulin Syringe-Needle U-100 (INSULIN SYRINGE 1CC/31GX5/16") 31G X 5/16" 1 ML MISC 1 each by Does not apply route 2 (two) times daily.  . isosorbide mononitrate (IMDUR) 60 MG 24 hr tablet TAKE (1/2) TABLET BY MOUTH TWICE DAILY.  [name] levothyroxine (SYNTHROID, LEVOTHROID) 137 MCG tablet TAKE 1 TABLET BY MOUTH ONCE DAILY IN THE MORNING BEFORE BREAKFAST  . loratadine (CLARITIN) 10 MG tablet Take 10 mg by mouth at bedtime as needed for allergies.   . metoprolol succinate (TOPROL-XL) 50 MG 24 hr tablet TAKE (1) TABLET BY MOUTH TWICE DAILY.  . modafinil (PROVIGIL) 200 MG tablet TAKE 1 TABLET BY MOUTH ONCE DAILY  . Multiple Vitamins-Minerals (MULTIVITAMIN ADULTS 50+ PO) Take 1 tablet by mouth daily.  . nitroGLYCERIN (NITROSTAT) 0.4 MG SL tablet DISSOLVE 1 TABLET UNDER TONGUE EVERY 5 MINUTES UP TO 15 MIN FOR CHESTPAIN. IF NO RELIEF CALL 911.  [name] ONE TOUCH ULTRA TEST test strip TEST BLOOD SUGAR UP TO 4 TIMES DAILY.  [name] ONETOUCH DELICA LANCETS 06T MISC USE AS DIRECTED TO TEST BLOOD SUGAR 4 TIMES DAILY.  . pravastatin (PRAVACHOL) 20 MG tablet TAKE 1 TABLET BY MOUTH ONCE DAILY  . rOPINIRole (REQUIP XL) 2 MG 24 hr tablet TAKE 1 TABLET BY MOUTH AT BEDTIME  . rOPINIRole (REQUIP) 4 MG tablet TAKE 1/2 TABLET BY MOUTH AT LUNCH AND 1 AT BEDTIME  . sodium chloride (OCEAN) 0.65 % SOLN nasal spray Place 1-2 sprays into both nostrils daily as needed for congestion.  . Testosterone Cypionate & Prop 200-20 MG/ML SOLN Inject 1 mL into the muscle every 14 (fourteen) days.  . vitamin B-12 (CYANOCOBALAMIN) 100 MCG tablet Take  100 mcg by mouth daily.  . Vitamin D, Ergocalciferol, (DRISDOL) 50000 units CAPS capsule TAKE 1 CAPSULE BY MOUTH ONCE A WEEK.  . [DISCONTINUED] Continuous Blood Gluc Receiver (FREESTYLE LIBRE 14 DAY READER) DEVI 1 each by Does not apply route every 14 (fourteen) days.  . [  DISCONTINUED] Continuous Blood Gluc Sensor (FREESTYLE LIBRE 14 DAY SENSOR) MISC 1 each by Does not apply route every 14 (fourteen) days.  . [DISCONTINUED] insulin degludec (TRESIBA FLEXTOUCH) 100 UNIT/ML SOPN FlexTouch Pen Inject 0.7 mLs (70 Units total) into the skin daily at 10 pm. (Patient taking differently: Inject 80 Units into the skin daily at 10 pm. )  . [DISCONTINUED] insulin lispro (HUMALOG KWIKPEN) 100 UNIT/ML KiwkPen Inject 0.18-0.24 mLs (18-24 Units total) into the skin 3 (three) times daily. (Patient taking differently: Inject 20-26 Units into the skin 3 (three) times daily. )  . [DISCONTINUED] insulin NPH-regular Human (NOVOLIN 70/30) (70-30) 100 UNIT/ML injection Inject 60 Units into the skin 2 (two) times daily with a meal.   No facility-administered encounter medications on file as of 07/10/2017.    ALLERGIES: Allergies  Allergen Reactions  . Cardizem [Diltiazem Hcl]     Edema   . Cardura [Doxazosin Mesylate]     Side effects  . Lipitor [Atorvastatin] Other (See Comments)    Leg cramps  . Paxil [Paroxetine Hcl]     Side effects   . Adhesive [Tape] Rash  . Codeine Rash and Other (See Comments)    Headache    VACCINATION STATUS: Immunization History  Administered Date(s) Administered  . Influenza Split 09/18/2012  . Influenza,inj,Quad PF,6+ Mos 10/13/2013, 10/06/2014  . Influenza-Unspecified 12/01/2015, 11/08/2016  . Pneumococcal Polysaccharide-23 10/02/2004, 10/03/2011  . Td 05/24/2006    Diabetes  He presents for his follow-up diabetic visit. He has type 2 diabetes mellitus. Onset time: Was diagnosed at approximate age of 68 years. His disease course has been fluctuating. There are no  hypoglycemic associated symptoms. Pertinent negatives for hypoglycemia include no confusion, headaches, pallor or seizures. Associated symptoms include polydipsia and polyuria. Pertinent negatives for diabetes include no chest pain, no fatigue, no polyphagia and no weakness. There are no hypoglycemic complications. Symptoms are improving. Diabetic complications include heart disease. Risk factors for coronary artery disease include diabetes mellitus, dyslipidemia, male sex, obesity, sedentary lifestyle and tobacco exposure. Current diabetic treatment includes intensive insulin program. He is compliant with treatment most of the time. His weight is stable. He is following a generally unhealthy diet. He has had a previous visit with a dietitian. He never participates in exercise. His home blood glucose trend is fluctuating minimally. His breakfast blood glucose range is generally 180-200 mg/dl. His lunch blood glucose range is generally 140-180 mg/dl. His dinner blood glucose range is generally 140-180 mg/dl. His bedtime blood glucose range is generally 180-200 mg/dl. His overall blood glucose range is 180-200 mg/dl. (His recent labs show A1c of 7.8%.) An ACE inhibitor/angiotensin II receptor blocker is being taken. He sees a podiatrist.Eye exam is current.  Hyperlipidemia  This is a chronic problem. The current episode started more than 1 year ago. The problem is controlled. Exacerbating diseases include diabetes, hypothyroidism and obesity. Pertinent negatives include no chest pain, myalgias or shortness of breath. Current antihyperlipidemic treatment includes statins. Risk factors for coronary artery disease include dyslipidemia, diabetes mellitus, hypertension, male sex, obesity and a sedentary lifestyle.  Hypertension  This is a chronic problem. The current episode started more than 1 year ago. The problem is controlled. Pertinent negatives include no chest pain, headaches, neck pain, palpitations or  shortness of breath. Risk factors for coronary artery disease include diabetes mellitus, dyslipidemia, male gender, obesity and sedentary lifestyle. Past treatments include angiotensin blockers. Hypertensive end-organ damage includes CAD/MI. Identifiable causes of hypertension include a thyroid problem.  Thyroid Problem  Presents  for follow-up visit. Patient reports no constipation, diarrhea, fatigue or palpitations. The symptoms have been improving. Past treatments include levothyroxine. His past medical history is significant for diabetes and hyperlipidemia.     Review of Systems  Constitutional: Negative for fatigue and unexpected weight change.  HENT: Negative for dental problem, mouth sores and trouble swallowing.   Eyes: Negative for visual disturbance.  Respiratory: Negative for cough, choking, chest tightness, shortness of breath and wheezing.   Cardiovascular: Negative for chest pain, palpitations and leg swelling.  Gastrointestinal: Negative for abdominal distention, abdominal pain, constipation, diarrhea, nausea and vomiting.  Endocrine: Positive for polydipsia and polyuria. Negative for polyphagia.  Genitourinary: Negative for dysuria, flank pain, hematuria and urgency.  Musculoskeletal: Negative for back pain, gait problem, myalgias and neck pain.  Skin: Negative for pallor, rash and wound.  Neurological: Negative for seizures, syncope, weakness, numbness and headaches.  Psychiatric/Behavioral: Negative.  Negative for confusion and dysphoric mood.    Objective:    BP 138/77   Pulse 63   Ht 5\' 5"  (1.651 m)   Wt 213 lb (96.6 kg)   BMI 35.45 kg/m   Wt Readings from Last 3 Encounters:  07/10/17 213 lb (96.6 kg)  04/10/17 214 lb (97.1 kg)  04/03/17 215 lb (97.5 kg)    Physical Exam  Constitutional: He is oriented to person, place, and time. He appears well-developed. He is cooperative. No distress.  HENT:  Head: Normocephalic and atraumatic.  Eyes: EOM are normal.   Neck: Normal range of motion. Neck supple. No tracheal deviation present. No thyromegaly present.  Cardiovascular: Normal rate, S1 normal and S2 normal. Exam reveals no gallop.  No murmur heard. Pulses:      Dorsalis pedis pulses are 1+ on the right side, and 1+ on the left side.       Posterior tibial pulses are 1+ on the right side, and 1+ on the left side.  Pulmonary/Chest: Effort normal. No respiratory distress. He has no wheezes.  Abdominal: He exhibits no distension. There is no tenderness. There is no guarding and no CVA tenderness.  Musculoskeletal: He exhibits no edema.       Right shoulder: He exhibits no swelling and no deformity.  Neurological: He is alert and oriented to person, place, and time. He has normal strength and normal reflexes. No cranial nerve deficit or sensory deficit. Gait normal.  Skin: Skin is warm and dry. No rash noted. No cyanosis. Nails show no clubbing.  Psychiatric: He has a normal mood and affect. His speech is normal. Judgment normal. Cognition and memory are normal.   CMP Latest Ref Rng & Units 07/03/2017 03/31/2017 03/30/2017  Glucose 65 - 99 mg/dL 314(H) 175(H) -  BUN 7 - 25 mg/dL 25 26(H) -  Creatinine 0.70 - 1.25 mg/dL 1.08 1.12 1.71(H)  Sodium 135 - 146 mmol/L 140 140 -  Potassium 3.5 - 5.3 mmol/L 4.4 4.1 -  Chloride 98 - 110 mmol/L 105 106 -  CO2 20 - 32 mmol/L 28 24 -  Calcium 8.6 - 10.3 mg/dL 9.4 8.8(L) -  Total Protein 6.1 - 8.1 g/dL 6.8 - -  Total Bilirubin 0.2 - 1.2 mg/dL 0.3 - -  Alkaline Phos 40 - 115 U/L - - -  AST 10 - 35 U/L 28 - -  ALT 9 - 46 U/L 28 - -     Diabetic Labs (most recent): Lab Results  Component Value Date   HGBA1C 8.5 (H) 07/03/2017   HGBA1C 7.8 (H) 03/30/2017  HGBA1C 9.1 (H) 12/21/2016   Lipid Panel     Component Value Date/Time   CHOL 148 08/19/2015 0914   TRIG 77 08/19/2015 0914   HDL 53 08/19/2015 0914   CHOLHDL 2.8 08/19/2015 0914   CHOLHDL 4.3 11/13/2013 0843   VLDL 31 11/13/2013 0843   LDLCALC  80 08/19/2015 0914    Assessment & Plan:   1. Uncontrolled type 2 diabetes mellitus with other circulatory complication, with long-term current use of insulin (HCC)  -His diabetes is  complicated by ordinary artery disease and patient remains at an extremely high risk for more acute and chronic complications of diabetes which include CAD, CVA, CKD, retinopathy, and neuropathy. These are all discussed in detail with the patient.  -He denies overdosing insulin or taking any other anti-diabetes medications.    -He returns with better blood glucose profile on intensive treatment with basal/bolus insulin.   His recent labs show A1c of 8.5%.    Glucose logs and insulin administration records pertaining to this visit,  to be scanned into patient's records.  Recent labs reviewed.  - I have re-counseled the patient on diet management and weight loss  by adopting a carbohydrate restricted / protein rich  Diet.  -  Suggestion is made for him to avoid simple carbohydrates  from his diet including Cakes, Sweet Desserts / Pastries, Ice Cream, Soda (diet and regular), Sweet Tea, Candies, Chips, Cookies, Store Bought Juices, Alcohol in Excess of  1-2 drinks a day, Artificial Sweeteners, and "Sugar-free" Products. This will help patient to have stable blood glucose profile and potentially avoid unintended weight gain.   - Patient is advised to stick to a routine mealtimes to eat 3 meals  a day and avoid unnecessary snacks ( to snack only to correct hypoglycemia).   - I have approached patient with the following individualized plan to manage diabetes and patient agrees.  -He did relatively better on basal/bolus insulin regimen.  -I discussed and increased Tresiba to 80 units nightly, increase Humalog to 20-26 3 times a day before breakfast, lunch, and supper for pre-meal blood glucose  above 90 mg/dL.  - He is currently using CGM, advised to continue document blood glucose at least 4 times a day-before  meals and at bedtime.   -Patient is encouraged to call clinic for blood glucose levels less than 70 or above 200 mg /dl x 3. - I advised him to stay off of metformin for now.    - Patient specific target  for A1c; LDL, HDL, Triglycerides, and  Waist Circumference were discussed in detail.  2) BP/HTN: His blood pressure is controlled to target.  I advised him to continue his current blood pressure medications including amlodipine, metoprolol.  He will have urine microalbumin measurement before his next visit, will be considered for low-dose lisinopril if he has microalbuminuria.   3) Lipids/HPL: His recent labs show LDL controlled at 80.  He is advised to continue pravastatin 20 mg p.o. nightly.    4)  Weight/Diet: CDE consult in progress, exercise, and carbohydrates information provided.  5) hypothyroidism - His thyroid function tests need to be updated before his next visit.   I advised him to continue levothyroxine 137 g by mouth every morning.    - We discussed about correct intake of levothyroxine, at fasting, with water, separated by at least 30 minutes from breakfast, and separated by more than 4 hours from calcium, iron, multivitamins, acid reflux medications (PPIs). -Patient is made aware of the fact  that thyroid hormone replacement is needed for life, dose to be adjusted by periodic monitoring of thyroid function tests.  6) Chronic Care/Health Maintenance:  -Patient is on ACEI/ARB and Statin medications and encouraged to continue to follow up with Ophthalmology, Podiatrist at least yearly or according to recommendations, and advised to  stay away from smoking. I have recommended yearly flu vaccine and pneumonia vaccination at least every 5 years; moderate intensity exercise for up to 150 minutes weekly; and  sleep for at least 7 hours a day.  - I advised patient to maintain close follow up with [name], MD for primary care needs.   - Time spent with the patient: 25 min,  of which >50% was spent in reviewing his blood glucose logs , discussing his hypo- and hyper-glycemic episodes, reviewing his current and  previous labs and insulin doses and developing a plan to avoid hypo- and hyper-glycemia. Please refer to Patient Instructions for Blood Glucose Monitoring and Insulin/Medications Dosing Guide"  in media tab for additional information. Paul Baker participated in the discussions, expressed understanding, and voiced agreement with the above plans.  All questions were answered to his satisfaction. he is encouraged to contact clinic should he have any questions or concerns prior to his return visit.  Follow up plan: -Return in about 3 months (around 10/10/2017) for follow up with pre-visit labs, meter, and logs.  Glade Lloyd, MD Phone: [phone]  Fax: [phone]  This note was partially dictated with voice recognition software. Similar sounding words can be transcribed inadequately or may not  be corrected upon review.  07/10/2017, 12:10 PM

## 2017-07-20 ENCOUNTER — Other Ambulatory Visit: Payer: Self-pay

## 2017-07-20 MED ORDER — ISOSORBIDE MONONITRATE ER 60 MG PO TB24: ORAL_TABLET | ORAL | 3 refills | Status: DC

## 2017-07-20 NOTE — Telephone Encounter (Signed)
Refilled imdur per fax request

## 2017-07-23 ENCOUNTER — Other Ambulatory Visit: Payer: Self-pay

## 2017-07-23 MED ORDER — AMLODIPINE BESYLATE 5 MG PO TABS: 5.0000 mg | ORAL_TABLET | Freq: Every day | ORAL | 3 refills | Status: DC

## 2017-07-23 NOTE — Telephone Encounter (Signed)
Refilled amlodipine per fax request  

## 2017-07-24 ENCOUNTER — Other Ambulatory Visit: Payer: Self-pay | Admitting: Adult Health

## 2017-07-24 DIAGNOSIS — G2581 Restless legs syndrome: Secondary | ICD-10-CM

## 2017-07-24 DIAGNOSIS — I129 Hypertensive chronic kidney disease with stage 1 through stage 4 chronic kidney disease, or unspecified chronic kidney disease: Secondary | ICD-10-CM | POA: Diagnosis not present

## 2017-07-24 MED ORDER — ROPINIROLE HCL 4 MG PO TABS: ORAL_TABLET | ORAL | 0 refills | Status: DC

## 2017-08-02 DIAGNOSIS — E291 Testicular hypofunction: Secondary | ICD-10-CM | POA: Diagnosis not present

## 2017-08-02 DIAGNOSIS — R3914 Feeling of incomplete bladder emptying: Secondary | ICD-10-CM | POA: Diagnosis not present

## 2017-08-02 DIAGNOSIS — R972 Elevated prostate specific antigen [PSA]: Secondary | ICD-10-CM | POA: Diagnosis not present

## 2017-08-02 DIAGNOSIS — N401 Enlarged prostate with lower urinary tract symptoms: Secondary | ICD-10-CM | POA: Diagnosis not present

## 2017-08-19 ENCOUNTER — Other Ambulatory Visit: Payer: Self-pay | Admitting: Adult Health

## 2017-08-20 MED ORDER — ROPINIROLE HCL ER 2 MG PO TB24: 2.0000 mg | ORAL_TABLET | Freq: Every day | ORAL | 0 refills | Status: DC

## 2017-08-20 NOTE — Addendum Note (Signed)
Addended by: Verlin Grills T on: 08/20/2017 07:26 AM   Modules accepted: Orders

## 2017-08-20 NOTE — Telephone Encounter (Signed)
Requip submitted to [facility] in [place], [place] scheduled for an appointment on 10/15/2017.

## 2017-08-20 NOTE — Telephone Encounter (Signed)
First Rx submitted failed to be sent electronically. I attemetted to print the Rx and fax but the rx would not print to the correct printer. Rx re sent electronically.

## 2017-08-27 ENCOUNTER — Other Ambulatory Visit: Payer: Self-pay | Admitting: "Endocrinology

## 2017-10-04 ENCOUNTER — Other Ambulatory Visit: Payer: Self-pay | Admitting: Neurology

## 2017-10-04 DIAGNOSIS — G2581 Restless legs syndrome: Secondary | ICD-10-CM

## 2017-10-08 DIAGNOSIS — E1159 Type 2 diabetes mellitus with other circulatory complications: Secondary | ICD-10-CM | POA: Diagnosis not present

## 2017-10-08 DIAGNOSIS — E038 Other specified hypothyroidism: Secondary | ICD-10-CM | POA: Diagnosis not present

## 2017-10-09 ENCOUNTER — Encounter: Payer: Self-pay | Admitting: Adult Health

## 2017-10-09 LAB — MICROALBUMIN / CREATININE URINE RATIO
Creatinine, Urine: 30 mg/dL (ref 20–320)
MICROALB UR: 4.6 mg/dL
Microalb Creat Ratio: 153 mcg/mg creat — ABNORMAL HIGH (ref <30)

## 2017-10-09 LAB — HEMOGLOBIN A1C
Hgb A1c MFr Bld: 9.4 % of total Hgb — ABNORMAL HIGH (ref <5.7)
Mean Plasma Glucose: 223 (calc)
eAG (mmol/L): 12.4 (calc)

## 2017-10-09 LAB — T4, FREE: FREE T4: 1.4 ng/dL (ref 0.8–1.8)

## 2017-10-09 LAB — TSH: TSH: 0.99 m[IU]/L (ref 0.40–4.50)

## 2017-10-11 ENCOUNTER — Ambulatory Visit (INDEPENDENT_AMBULATORY_CARE_PROVIDER_SITE_OTHER): Payer: Medicare Other | Admitting: "Endocrinology

## 2017-10-11 ENCOUNTER — Encounter: Payer: Self-pay | Admitting: "Endocrinology

## 2017-10-11 VITALS — BP 132/80 | HR 82 | Ht 65.0 in | Wt 217.0 lb

## 2017-10-11 DIAGNOSIS — I10 Essential (primary) hypertension: Secondary | ICD-10-CM

## 2017-10-11 DIAGNOSIS — I25119 Atherosclerotic heart disease of native coronary artery with unspecified angina pectoris: Secondary | ICD-10-CM

## 2017-10-11 DIAGNOSIS — E782 Mixed hyperlipidemia: Secondary | ICD-10-CM

## 2017-10-11 DIAGNOSIS — E038 Other specified hypothyroidism: Secondary | ICD-10-CM

## 2017-10-11 DIAGNOSIS — E1159 Type 2 diabetes mellitus with other circulatory complications: Secondary | ICD-10-CM | POA: Diagnosis not present

## 2017-10-11 MED ORDER — METFORMIN HCL 500 MG PO TABS: 500.0000 mg | ORAL_TABLET | Freq: Two times a day (BID) | ORAL | 2 refills | Status: DC

## 2017-10-11 MED ORDER — INSULIN LISPRO 100 UNIT/ML (KWIKPEN): 22.0000 [IU] | PEN_INJECTOR | Freq: Three times a day (TID) | SUBCUTANEOUS | 2 refills | Status: DC

## 2017-10-11 MED ORDER — INSULIN DEGLUDEC 100 UNIT/ML ~~LOC~~ SOPN: 92.0000 [IU] | PEN_INJECTOR | Freq: Every day | SUBCUTANEOUS | 1 refills | Status: DC

## 2017-10-11 NOTE — Patient Instructions (Signed)

## 2017-10-11 NOTE — Progress Notes (Signed)
Endocrinology follow-up note   Subjective:    Patient ID: Paul Baker, male    DOB: [birth year], PCP [name], MD   Past Medical History:  Diagnosis Date  . Arthritis   . Asthma   . Colon polyp   . Coronary atherosclerosis of native coronary artery    Diseased nondominant RCA  . DVT (deep venous thrombosis) ([place]) 2005   Right arm  . Essential hypertension, benign   . Headache   . History of transfusion   . Hypersomnia    CPAP of 16 cm, diagnosed with AHI of 60 in 2012,[place] 21- narcolepsy?  [name] Hypothyroidism   . Mixed hyperlipidemia   . Morbid obesity ([place])   . MRSA (methicillin resistant staph aureus) culture positive    08/2012  . Narcolepsy   . OSA (obstructive sleep apnea)   . Pneumonia   . Psoriasis   . Pulmonary embolism ([place]) 2004  . RLS (restless legs syndrome)   . Rotator cuff disorder    Left  . Septic arthritis of knee, left ([place])   . Skin cancer, basal cell   . Type 2 diabetes mellitus ([place])    Past Surgical History:  Procedure Laterality Date  . BACK SURGERY    . CATARACT EXTRACTION Bilateral   . COLONOSCOPY    . CYST REMOVAL TRUNK     from back  . EYE SURGERY Left 2016   laser to left eye  . HIP SURGERY     bone removed from both sides of hip  . KNEE ARTHROTOMY Right 12/04/2014   Procedure: KNEE ARTHROTOMY PATELLA LIGAMENT RECONSTRUSION AND REPAIR RIGHT KNEE;  Surgeon: Paralee Cancel, MD;  Location: [facility];  Service: Orthopedics;  Laterality: Right;  . KNEE SURGERY     X 25 TIMES  . LUMBAR DISC SURGERY     Left L3, L4, L5 discecotomy with decompression of L4 root  . TONSILLECTOMY    . TOTAL KNEE ARTHROPLASTY  2003   LEFT  . TOTAL KNEE ARTHROPLASTY Right 03/23/2014   Procedure: RIGHT TOTAL KNEE ARTHROPLASTY AND REMOVAL RIGHT TIBIAL  DEEP IMPLANT STAPLE;  Surgeon: Paralee Cancel, MD;  Location: [facility];  Service: Orthopedics;  Laterality: Right;  . TOTAL KNEE REVISION  2005   LEFT  . WRIST SURGERY     Social History    Socioeconomic History  . Marital status: Married    Spouse name: Toney Reil  . Number of children: 2  . Years of education: college  . Highest education level: Not on file  Occupational History  . Occupation: Disabled    Fish farm manager: UNEMPLOYED  Social Needs  . Financial resource strain: Not on file  . Food insecurity:    Worry: Not on file    Inability: Not on file  . Transportation needs:    Medical: Not on file    Non-medical: Not on file  Tobacco Use  . Smoking status: Former Smoker    Types: Cigarettes    Start date: 06/19/1967    Last attempt to quit: 01/03/1995    Years since quitting: 22.7  . Smokeless tobacco: Never Used  Substance and Sexual Activity  . Alcohol use: No    Alcohol/week: 0.0 standard drinks    Comment: quit drinking in 07/86  . Drug use: No  . Sexual activity: Yes    Partners: Female  Lifestyle  . Physical activity:    Days per week: Not on file    Minutes per session: Not on file  .  Stress: Not on file  Relationships  . Social connections:    Talks on phone: Not on file    Gets together: Not on file    Attends religious service: Not on file    Active member of club or organization: Not on file    Attends meetings of clubs or organizations: Not on file    Relationship status: Not on file  Other Topics Concern  . Not on file  Social History Narrative    67 year old, right-handed, caucasian male with a past medical history of obesity, hypertension, hyperlipidemia, diabetes, obstructive sleep apnea, presenting with frequent nighttime awakenings, excessive daytime sleepiness, also transient confusional episodes.RLS and one beosity, OSA on CPAP with AHI of 3.2 and  setting of 16 cm water , [facility] .   Outpatient Encounter Medications as of 10/11/2017  Medication Sig  . amLODipine (NORVASC) 5 MG tablet Take 1 tablet (5 mg total) by mouth daily.  . B-D ULTRAFINE III SHORT PEN 31G X 8 MM MISC USING FIVE TIMES DAILY.  . clonazePAM (KLONOPIN) 1 MG  tablet TAKE (1) TABLET BY MOUTH AT BEDTIME FOR RESTLESS LEGS.  . fenofibrate 160 MG tablet TAKE 1 TABLET BY MOUTH AT BEDTIME - NEEDS OFFICE VISIT  . furosemide (LASIX) 40 MG tablet Take 40 mg by mouth daily.   . insulin degludec (TRESIBA FLEXTOUCH) 100 UNIT/ML SOPN FlexTouch Pen Inject 0.92 mLs (92 Units total) into the skin at bedtime.  . insulin lispro (HUMALOG KWIKPEN) 100 UNIT/ML KiwkPen Inject 0.22-0.28 mLs (22-28 Units total) into the skin 3 (three) times daily.  . Insulin Syringe-Needle U-100 (INSULIN SYRINGE 1CC/31GX5/16") 31G X 5/16" 1 ML MISC 1 each by Does not apply route 2 (two) times daily.  . isosorbide mononitrate (IMDUR) 60 MG 24 hr tablet TAKE (1/2) TABLET BY MOUTH TWICE DAILY.  [name] levothyroxine (SYNTHROID, LEVOTHROID) 137 MCG tablet TAKE 1 TABLET BY MOUTH ONCE DAILY IN THE MORNING BEFORE BREAKFAST  . loratadine (CLARITIN) 10 MG tablet Take 10 mg by mouth at bedtime as needed for allergies.   . metFORMIN (GLUCOPHAGE) 500 MG tablet Take 1 tablet (500 mg total) by mouth 2 (two) times daily with a meal.  . metoprolol succinate (TOPROL-XL) 50 MG 24 hr tablet TAKE (1) TABLET BY MOUTH TWICE DAILY.  . modafinil (PROVIGIL) 200 MG tablet TAKE 1 TABLET BY MOUTH ONCE DAILY  . Multiple Vitamins-Minerals (MULTIVITAMIN ADULTS 50+ PO) Take 1 tablet by mouth daily.  . nitroGLYCERIN (NITROSTAT) 0.4 MG SL tablet DISSOLVE 1 TABLET UNDER TONGUE EVERY 5 MINUTES UP TO 15 MIN FOR CHESTPAIN. IF NO RELIEF CALL 911.  [name] ONE TOUCH ULTRA TEST test strip TEST BLOOD SUGAR UP TO 4 TIMES DAILY.  [name] ONETOUCH DELICA LANCETS 47W MISC USE AS DIRECTED TO TEST BLOOD SUGAR 4 TIMES DAILY.  . pravastatin (PRAVACHOL) 20 MG tablet TAKE 1 TABLET BY MOUTH ONCE DAILY  . rOPINIRole (REQUIP XL) 2 MG 24 hr tablet Take 1 tablet (2 mg total) by mouth at bedtime.  [name] rOPINIRole (REQUIP) 4 MG tablet TAKE 1/2 (ONE-HALF) TABLET BY MOUTH AT LUNCH AND 1 TABLET AT BEDTIME  . sodium chloride (OCEAN) 0.65 % SOLN nasal spray Place 1-2 sprays  into both nostrils daily as needed for congestion.  . Testosterone Cypionate & Prop 200-20 MG/ML SOLN Inject 1 mL into the muscle every 14 (fourteen) days.  . vitamin B-12 (CYANOCOBALAMIN) 100 MCG tablet Take 100 mcg by mouth daily.  . Vitamin D, Ergocalciferol, (DRISDOL) 50000 units CAPS capsule TAKE 1 CAPSULE  BY MOUTH ONCE A WEEK  . [DISCONTINUED] insulin degludec (TRESIBA FLEXTOUCH) 100 UNIT/ML SOPN FlexTouch Pen Inject 0.8 mLs (80 Units total) into the skin daily at 10 pm.  . [DISCONTINUED] insulin lispro (HUMALOG KWIKPEN) 100 UNIT/ML KiwkPen Inject 0.2-0.26 mLs (20-26 Units total) into the skin 3 (three) times daily.   No facility-administered encounter medications on file as of 10/11/2017.    ALLERGIES: Allergies  Allergen Reactions  . Cardizem [Diltiazem Hcl]     Edema   . Cardura [Doxazosin Mesylate]     Side effects  . Lipitor [Atorvastatin] Other (See Comments)    Leg cramps  . Paxil [Paroxetine Hcl]     Side effects   . Adhesive [Tape] Rash  . Codeine Rash and Other (See Comments)    Headache    VACCINATION STATUS: Immunization History  Administered Date(s) Administered  . Influenza Split 09/18/2012  . Influenza,inj,Quad PF,6+ Mos 10/13/2013, 10/06/2014  . Influenza-Unspecified 12/01/2015, 11/08/2016  . Pneumococcal Polysaccharide-23 10/02/2004, 10/03/2011  . Td 05/24/2006    Diabetes  He presents for his follow-up diabetic visit. He has type 2 diabetes mellitus. Onset time: Was diagnosed at approximate age of 48 years. His disease course has been worsening. There are no hypoglycemic associated symptoms. Pertinent negatives for hypoglycemia include no confusion, headaches, pallor or seizures. Associated symptoms include polydipsia and polyuria. Pertinent negatives for diabetes include no chest pain, no fatigue, no polyphagia and no weakness. There are no hypoglycemic complications. Symptoms are worsening. Diabetic complications include heart disease. Risk factors for  coronary artery disease include diabetes mellitus, dyslipidemia, male sex, obesity, sedentary lifestyle and tobacco exposure. Current diabetic treatment includes intensive insulin program. He is compliant with treatment most of the time. His weight is increasing steadily. He is following a generally unhealthy diet. He has had a previous visit with a dietitian. He never participates in exercise. His home blood glucose trend is fluctuating minimally. His breakfast blood glucose range is generally >200 mg/dl. His lunch blood glucose range is generally >200 mg/dl. His dinner blood glucose range is generally >200 mg/dl. His bedtime blood glucose range is generally >200 mg/dl. His overall blood glucose range is >200 mg/dl. (His recent labs show A1c of 7.8%.) An ACE inhibitor/angiotensin II receptor blocker is being taken. He sees a podiatrist.Eye exam is current.  Hyperlipidemia  This is a chronic problem. The current episode started more than 1 year ago. The problem is controlled. Exacerbating diseases include diabetes, hypothyroidism and obesity. Pertinent negatives include no chest pain, myalgias or shortness of breath. Current antihyperlipidemic treatment includes statins. Risk factors for coronary artery disease include dyslipidemia, diabetes mellitus, hypertension, male sex, obesity and a sedentary lifestyle.  Hypertension  This is a chronic problem. The current episode started more than 1 year ago. The problem is controlled. Pertinent negatives include no chest pain, headaches, neck pain, palpitations or shortness of breath. Risk factors for coronary artery disease include diabetes mellitus, dyslipidemia, male gender, obesity and sedentary lifestyle. Past treatments include angiotensin blockers. Hypertensive end-organ damage includes CAD/MI. Identifiable causes of hypertension include a thyroid problem.  Thyroid Problem  Presents for follow-up visit. Patient reports no constipation, diarrhea, fatigue or  palpitations. The symptoms have been improving. Past treatments include levothyroxine. His past medical history is significant for diabetes and hyperlipidemia.     Review of Systems  Constitutional: Negative for fatigue and unexpected weight change.  HENT: Negative for dental problem, mouth sores and trouble swallowing.   Eyes: Negative for visual disturbance.  Respiratory: Negative for cough,  choking, chest tightness, shortness of breath and wheezing.   Cardiovascular: Negative for chest pain, palpitations and leg swelling.  Gastrointestinal: Negative for abdominal distention, abdominal pain, constipation, diarrhea, nausea and vomiting.  Endocrine: Positive for polydipsia and polyuria. Negative for polyphagia.  Genitourinary: Negative for dysuria, flank pain, hematuria and urgency.  Musculoskeletal: Negative for back pain, gait problem, myalgias and neck pain.  Skin: Negative for pallor, rash and wound.  Neurological: Negative for seizures, syncope, weakness, numbness and headaches.  Psychiatric/Behavioral: Negative.  Negative for confusion and dysphoric mood.    Objective:    BP 132/80   Pulse 82   Ht 5\' 5"  (1.651 m)   Wt 217 lb (98.4 kg)   BMI 36.11 kg/m   Wt Readings from Last 3 Encounters:  10/11/17 217 lb (98.4 kg)  07/10/17 213 lb (96.6 kg)  04/10/17 214 lb (97.1 kg)    Physical Exam  Constitutional: He is oriented to person, place, and time. He appears well-developed. He is cooperative. No distress.  HENT:  Head: Normocephalic and atraumatic.  Eyes: EOM are normal.  Neck: Normal range of motion. Neck supple. No tracheal deviation present. No thyromegaly present.  Cardiovascular: Normal rate, S1 normal and S2 normal. Exam reveals no gallop.  No murmur heard. Pulses:      Dorsalis pedis pulses are 1+ on the right side, and 1+ on the left side.       Posterior tibial pulses are 1+ on the right side, and 1+ on the left side.  Pulmonary/Chest: Effort normal. No  respiratory distress. He has no wheezes.  Abdominal: He exhibits no distension. There is no tenderness. There is no guarding and no CVA tenderness.  Musculoskeletal: He exhibits no edema.       Right shoulder: He exhibits no swelling and no deformity.  Neurological: He is alert and oriented to person, place, and time. He has normal strength and normal reflexes. No cranial nerve deficit or sensory deficit. Gait normal.  Skin: Skin is warm and dry. No rash noted. No cyanosis. Nails show no clubbing.  Psychiatric: He has a normal mood and affect. His speech is normal. Judgment normal. Cognition and memory are normal.   CMP Latest Ref Rng & Units 07/03/2017 03/31/2017 03/30/2017  Glucose 65 - 99 mg/dL 314(H) 175(H) -  BUN 7 - 25 mg/dL 25 26(H) -  Creatinine 0.70 - 1.25 mg/dL 1.08 1.12 1.71(H)  Sodium 135 - 146 mmol/L 140 140 -  Potassium 3.5 - 5.3 mmol/L 4.4 4.1 -  Chloride 98 - 110 mmol/L 105 106 -  CO2 20 - 32 mmol/L 28 24 -  Calcium 8.6 - 10.3 mg/dL 9.4 8.8(L) -  Total Protein 6.1 - 8.1 g/dL 6.8 - -  Total Bilirubin 0.2 - 1.2 mg/dL 0.3 - -  Alkaline Phos 40 - 115 U/L - - -  AST 10 - 35 U/L 28 - -  ALT 9 - 46 U/L 28 - -     Diabetic Labs (most recent): Lab Results  Component Value Date   HGBA1C 9.4 (H) 10/08/2017   HGBA1C 8.5 (H) 07/03/2017   HGBA1C 7.8 (H) 03/30/2017   Lipid Panel     Component Value Date/Time   CHOL 148 08/19/2015 0914   TRIG 77 08/19/2015 0914   HDL 53 08/19/2015 0914   CHOLHDL 2.8 08/19/2015 0914   CHOLHDL 4.3 11/13/2013 0843   VLDL 31 11/13/2013 0843   LDLCALC 80 08/19/2015 0914    Assessment & Plan:   1. Uncontrolled  type 2 diabetes mellitus with other circulatory complication, with long-term current use of insulin ([facility]CC)  -His diabetes is  complicated by ordinary artery disease and patient remains at an extremely high risk for more acute and chronic complications of diabetes which include CAD, CVA, CKD, retinopathy, and neuropathy. These are all  discussed in detail with the patient.  -He denies overdosing insulin or taking any other anti-diabetes medications.    -He returns with increasing glycemic profile and A1c of 9.4% increasing from 8.5%.  His CGM analysis shows 62% above target, 32% within target, 6% hypoglycemia.  His average blood glucose for the last 90 days is 219.     Glucose logs and insulin administration records pertaining to this visit,  to be scanned into patient's records.  Recent labs reviewed.  - I have re-counseled the patient on diet management and weight loss  by adopting a carbohydrate restricted / protein rich  Diet.  -  Suggestion is made for him to avoid simple carbohydrates  from his diet including Cakes, Sweet Desserts / Pastries, Ice Cream, Soda (diet and regular), Sweet Tea, Candies, Chips, Cookies, Store Bought Juices, Alcohol in Excess of  1-2 drinks a day, Artificial Sweeteners, and "Sugar-free" Products. This will help patient to have stable blood glucose profile and potentially avoid unintended weight gain.  - Patient is advised to stick to a routine mealtimes to eat 3 meals  a day and avoid unnecessary snacks ( to snack only to correct hypoglycemia).   - I have approached patient with the following individualized plan to manage diabetes and patient agrees.  -He did relatively better on basal/bolus insulin regimen.  -I discussed and increase his Tresiba to 92 units nightly, increase his Humalog to 22-28 units 3 times a day before breakfast, lunch, and supper for pre-meal blood glucose  above 90 mg/dL.  - He is currently using CGM, advised to continue document blood glucose at least 4 times a day-before meals and at bedtime.   -Patient is encouraged to call clinic for blood glucose levels less than 70 or above 200 mg /dl x 3. -For the last 3 visits, his renal function has been stable.  He would benefit from resumption of metformin therapy.  I discussed in resumed metformin 500 mg p.o. twice  daily-daily after breakfast and supper.     - Patient specific target  for A1c; LDL, HDL, Triglycerides, and  Waist Circumference were discussed in detail.  2) BP/HTN: His blood pressure is controlled to target.  I advised him to continue his current blood pressure medications including amlodipine, metoprolol.  His urine microalbumin is 4.6, within normal limits.  He will be considered for low-dose lisinopril if he has microalbuminuria.   3) Lipids/HPL: His recent labs show LDL controlled at 80.  He is advised to continue pravastatin 20 mg p.o. nightly.    4)  Weight/Diet: CDE consult in progress, exercise, and carbohydrates information provided.  5) hypothyroidism -His recent thyroid function tests are consistent with appropriate replacement.  He is advised to continue levothyroxine 137 mcg p.o. every morning.     - We discussed about correct intake of levothyroxine, at fasting, with water, separated by at least 30 minutes from breakfast, and separated by more than 4 hours from calcium, iron, multivitamins, acid reflux medications (PPIs). -Patient is made aware of the fact that thyroid hormone replacement is needed for life, dose to be adjusted by periodic monitoring of thyroid function tests.  6) Chronic Care/Health Maintenance:  -Patient is  on ACEI/ARB and Statin medications and encouraged to continue to follow up with Ophthalmology, Podiatrist at least yearly or according to recommendations, and advised to  stay away from smoking. I have recommended yearly flu vaccine and pneumonia vaccination at least every 5 years; moderate intensity exercise for up to 150 minutes weekly; and  sleep for at least 7 hours a day.  - I advised patient to maintain close follow up with [name], MD for primary care needs.   - Time spent with the patient: 25 min, of which >50% was spent in reviewing his blood glucose logs , discussing his hypo- and hyper-glycemic episodes, reviewing his current and   previous labs and insulin doses and developing a plan to avoid hypo- and hyper-glycemia. Please refer to Patient Instructions for Blood Glucose Monitoring and Insulin/Medications Dosing Guide"  in media tab for additional information. Paul Baker participated in the discussions, expressed understanding, and voiced agreement with the above plans.  All questions were answered to his satisfaction. he is encouraged to contact clinic should he have any questions or concerns prior to his return visit.   Follow up plan: -Return in about 3 months (around 01/11/2018) for Follow up with Pre-visit Labs, Meter, and Logs.  Glade Lloyd, MD Phone: [phone]  Fax: [phone]  This note was partially dictated with voice recognition software. Similar sounding words can be transcribed inadequately or may not  be corrected upon review.  10/11/2017, 1:44 PM

## 2017-10-15 ENCOUNTER — Other Ambulatory Visit: Payer: Self-pay | Admitting: Cardiology

## 2017-10-15 ENCOUNTER — Ambulatory Visit: Payer: Medicare Other | Admitting: Adult Health

## 2017-10-15 ENCOUNTER — Other Ambulatory Visit: Payer: Self-pay | Admitting: Adult Health

## 2017-10-16 NOTE — Telephone Encounter (Signed)
Refilled x 2 months with note to pharmacy re: patient needs to schedule follow up.

## 2017-10-18 ENCOUNTER — Encounter: Payer: Self-pay | Admitting: Adult Health

## 2017-10-18 ENCOUNTER — Ambulatory Visit (INDEPENDENT_AMBULATORY_CARE_PROVIDER_SITE_OTHER): Payer: Medicare Other | Admitting: Adult Health

## 2017-10-18 VITALS — BP 144/73 | HR 70 | Ht 65.0 in | Wt 217.0 lb

## 2017-10-18 DIAGNOSIS — Z9989 Dependence on other enabling machines and devices: Secondary | ICD-10-CM | POA: Diagnosis not present

## 2017-10-18 DIAGNOSIS — I25119 Atherosclerotic heart disease of native coronary artery with unspecified angina pectoris: Secondary | ICD-10-CM

## 2017-10-18 DIAGNOSIS — G2581 Restless legs syndrome: Secondary | ICD-10-CM

## 2017-10-18 DIAGNOSIS — G4733 Obstructive sleep apnea (adult) (pediatric): Secondary | ICD-10-CM

## 2017-10-18 DIAGNOSIS — Z8639 Personal history of other endocrine, nutritional and metabolic disease: Secondary | ICD-10-CM | POA: Diagnosis not present

## 2017-10-18 MED ORDER — ROPINIROLE HCL 4 MG PO TABS: 2.0000 mg | ORAL_TABLET | Freq: Two times a day (BID) | ORAL | 5 refills | Status: DC

## 2017-10-18 MED ORDER — ROPINIROLE HCL ER 4 MG PO TB24: 4.0000 mg | ORAL_TABLET | Freq: Every day | ORAL | 5 refills | Status: DC

## 2017-10-18 NOTE — Patient Instructions (Addendum)
Your Plan:  Continue trying to use CPAP- will send for mask refitting Change requip: increase extended release to 4 mg at bedtime. Take Immediate releease 1/2 tablet twice a day. If your symptoms worsen or you develop new symptoms please let us know.   Thank you for coming to see [place] at [facility]. I hope we have been able to provide you high quality care today.  You may receive a patient satisfaction survey over the next few weeks. We would appreciate your feedback and comments so that we may continue to improve ourselves and the health of our patients.

## 2017-10-18 NOTE — Progress Notes (Signed)
PATIENT: [name] DOB: 07-Jun-1950  REASON FOR VISIT: follow up HISTORY FROM: patient  HISTORY OF PRESENT ILLNESS: Today 10/18/17 Mr. Blancett is a 67 year old male with a history of obstructive sleep apnea on CPAP.  His CPAP download indicates that he uses machine 42 out of 90 days for compliance of 47%.  He uses machine greater than 4 hours 29 days for compliance of 32%.  On average he uses his machine 4 hours and 56 minutes.  His residual AHI is 4.6 on 6 to 16 cm of water with EPR of 3.  His leak in the 95th percentile is 30.4 L/min.  He reports that he has a hard time using the nasal pillows.  He states that often he feels that he is suffocating or the mask comes off during the night.  He also continues to have trouble with restless legs.  He states that when we switched him to the extended release tablet did offer him benefit for several weeks but now in the last week he has been having more symptoms typically before bedtime and during the night.  He returns today for evaluation.  HISTORY: Mr. Centola is a 67 year old male with a history of obstructive sleep apnea on CPAP and restless leg syndrome.  He returns today for follow-up.  His download indicates that he use his machine 26 out of 30 days for compliance of 87%.  He uses machine greater than 4 hours 21 out of 30 days for compliance of 70%.  On average he uses his machine 5 hours and his residual AHI is 4.9 on a minimum pressure of 6 cm of water and maximum pressure of 16 cm of water with EPR of 3.  The patient's leak in the 95th percentile is 35.5 L/min.  The patient states that his mask was never refitted despite orders being sent at the last 2 visits. He reports that his restless legs has increased.  He states in the afternoons his symptoms have increased.  He also states that he typically wakes up around 4 AM with symptoms.  He continues to take Requip 4 mg-half a tablet at noon and 1-1/2 tablets at bedtime.  He returns  today for an evaluation.   REVIEW OF SYSTEMS: Out of a complete 14 system review of symptoms, the patient complains only of the following symptoms, and all other reviewed systems are negative.  Epworth sleepiness score is 14  Blurred vision, ringing in ears, leg swelling, restless leg, insomnia, apnea, frequent waking, daytime sleepiness, snoring, sleep talking, urgency, joint pain, joint swelling, back pain, aching muscles, muscle cramps, muscle dizziness, headache, numbness  ALLERGIES: Allergies  Allergen Reactions  . Cardizem [Diltiazem Hcl]     Edema   . Cardura [Doxazosin Mesylate]     Side effects  . Lipitor [Atorvastatin] Other (See Comments)    Leg cramps  . Paxil [Paroxetine Hcl]     Side effects   . Adhesive [Tape] Rash  . Codeine Rash and Other (See Comments)    Headache     HOME MEDICATIONS: Outpatient Medications Prior to Visit  Medication Sig Dispense Refill  . amLODipine (NORVASC) 5 MG tablet Take 1 tablet (5 mg total) by mouth daily. 90 tablet 3  . B-D ULTRAFINE III SHORT PEN 31G X 8 MM MISC USING FIVE TIMES DAILY. 150 each 5  . fenofibrate 160 MG tablet TAKE 1 TABLET BY MOUTH AT BEDTIME - NEEDS OFFICE VISIT 30 tablet 0  . furosemide (LASIX) 40 MG  tablet Take 40 mg by mouth daily.     . insulin degludec (TRESIBA FLEXTOUCH) 100 UNIT/ML SOPN FlexTouch Pen Inject 0.92 mLs (92 Units total) into the skin at bedtime. 10 pen 1  . insulin lispro (HUMALOG KWIKPEN) 100 UNIT/ML KiwkPen Inject 0.22-0.28 mLs (22-28 Units total) into the skin 3 (three) times daily. 5 pen 2  . Insulin Syringe-Needle U-100 (INSULIN SYRINGE 1CC/31GX5/16") 31G X 5/16" 1 ML MISC 1 each by Does not apply route 2 (two) times daily. 100 each 2  . isosorbide mononitrate (IMDUR) 60 MG 24 hr tablet TAKE (1/2) TABLET BY MOUTH TWICE DAILY. 90 tablet 3  . levothyroxine (SYNTHROID, LEVOTHROID) 137 MCG tablet TAKE 1 TABLET BY MOUTH ONCE DAILY IN THE MORNING BEFORE BREAKFAST 30 tablet 3  . loratadine  (CLARITIN) 10 MG tablet Take 10 mg by mouth at bedtime as needed for allergies.     . metFORMIN (GLUCOPHAGE) 500 MG tablet Take 1 tablet (500 mg total) by mouth 2 (two) times daily with a meal. 60 tablet 2  . metoprolol succinate (TOPROL-XL) 50 MG 24 hr tablet TAKE 1 TABLET BY MOUTH TWICE DAILY 180 tablet 1  . modafinil (PROVIGIL) 200 MG tablet TAKE 1 TABLET BY MOUTH ONCE DAILY 30 tablet 5  . Multiple Vitamins-Minerals (MULTIVITAMIN ADULTS 50+ PO) Take 1 tablet by mouth daily.    . nitroGLYCERIN (NITROSTAT) 0.4 MG SL tablet DISSOLVE 1 TABLET UNDER TONGUE EVERY 5 MINUTES UP TO 15 MIN FOR CHESTPAIN. IF NO RELIEF CALL 911. 25 tablet 3  . ONE TOUCH ULTRA TEST test strip TEST BLOOD SUGAR UP TO 4 TIMES DAILY. 150 each 5  . ONETOUCH DELICA LANCETS 25D MISC USE AS DIRECTED TO TEST BLOOD SUGAR 4 TIMES DAILY. 150 each 5  . pravastatin (PRAVACHOL) 20 MG tablet TAKE 1 TABLET BY MOUTH ONCE DAILY 30 tablet 0  . rOPINIRole (REQUIP XL) 2 MG 24 hr tablet TAKE 1 TABLET BY MOUTH AT BEDTIME 60 tablet 0  . rOPINIRole (REQUIP) 4 MG tablet TAKE 1/2 (ONE-HALF) TABLET BY MOUTH AT LUNCH AND 1 TABLET AT BEDTIME 135 tablet 0  . sodium chloride (OCEAN) 0.65 % SOLN nasal spray Place 1-2 sprays into both nostrils daily as needed for congestion.    . vitamin B-12 (CYANOCOBALAMIN) 100 MCG tablet Take 100 mcg by mouth daily.    . Vitamin D, Ergocalciferol, (DRISDOL) 50000 units CAPS capsule TAKE 1 CAPSULE BY MOUTH ONCE A WEEK 12 capsule 0  . clonazePAM (KLONOPIN) 1 MG tablet TAKE (1) TABLET BY MOUTH AT BEDTIME FOR RESTLESS LEGS. (Patient not taking: Reported on 10/18/2017) 30 tablet 3  . Testosterone Cypionate & Prop 200-20 MG/ML SOLN Inject 1 mL into the muscle every 14 (fourteen) days.     No facility-administered medications prior to visit.     PAST MEDICAL HISTORY: Past Medical History:  Diagnosis Date  . Arthritis   . Asthma   . Colon polyp   . Coronary atherosclerosis of native coronary artery    Diseased nondominant  RCA  . DVT (deep venous thrombosis) ([place]) 2005   Right arm  . Essential hypertension, benign   . Headache   . History of transfusion   . Hypersomnia    CPAP of 16 cm, diagnosed with AHI of 60 in 2012,epworth 21- narcolepsy?  [name] Hypothyroidism   . Mixed hyperlipidemia   . Morbid obesity ([place])   . MRSA (methicillin resistant staph aureus) culture positive    08/2012  . Narcolepsy   . OSA (obstructive sleep  apnea)    CPAP  . Pneumonia   . Psoriasis   . Pulmonary embolism ([place]) 2004  . RLS (restless legs syndrome)   . Rotator cuff disorder    Left  . Septic arthritis of knee, left ([place])   . Skin cancer, basal cell   . Type 2 diabetes mellitus ([place])     PAST SURGICAL HISTORY: Past Surgical History:  Procedure Laterality Date  . BACK SURGERY    . CATARACT EXTRACTION Bilateral   . COLONOSCOPY    . CYST REMOVAL TRUNK     from back  . EYE SURGERY Left 2016   laser to left eye  . HIP SURGERY     bone removed from both sides of hip  . KNEE ARTHROTOMY Right 12/04/2014   Procedure: KNEE ARTHROTOMY PATELLA LIGAMENT RECONSTRUSION AND REPAIR RIGHT KNEE;  Surgeon: Paralee Cancel, MD;  Location: [place] [facility];  Service: Orthopedics;  Laterality: Right;  . KNEE SURGERY     X 25 TIMES  . LUMBAR DISC SURGERY     Left L3, L4, L5 discecotomy with decompression of L4 root  . TONSILLECTOMY    . TOTAL KNEE ARTHROPLASTY  2003   LEFT  . TOTAL KNEE ARTHROPLASTY Right 03/23/2014   Procedure: RIGHT TOTAL KNEE ARTHROPLASTY AND REMOVAL RIGHT TIBIAL  DEEP IMPLANT STAPLE;  Surgeon: Paralee Cancel, MD;  Location[facility];  Service: Orthopedics;  Laterality: Right;  . TOTAL KNEE REVISION  2005   LEFT  . WRIST SURGERY      FAMILY HISTORY: Family History  Problem Relation Age of Onset  . Hypertension Father   . Heart attack Father   . Kidney Stones Father   . Seizures Grandchild   . Narcolepsy Grandchild   . Diabetes Sister     SOCIAL HISTORY: Social History   Socioeconomic History  . Marital status:  Married    Spouse name: Toney Reil  . Number of children: 2  . Years of education: college  . Highest education level: Not on file  Occupational History  . Occupation: Disabled    Fish farm manager: UNEMPLOYED  Social Needs  . Financial resource strain: Not on file  . Food insecurity:    Worry: Not on file    Inability: Not on file  . Transportation needs:    Medical: Not on file    Non-medical: Not on file  Tobacco Use  . Smoking status: Former Smoker    Types: Cigarettes    Start date: 06/19/1967    Last attempt to quit: 01/03/1995    Years since quitting: 22.8  . Smokeless tobacco: Never Used  Substance and Sexual Activity  . Alcohol use: No    Alcohol/week: 0.0 standard drinks    Comment: quit drinking in 07/86  . Drug use: No  . Sexual activity: Yes    Partners: Female  Lifestyle  . Physical activity:    Days per week: Not on file    Minutes per session: Not on file  . Stress: Not on file  Relationships  . Social connections:    Talks on phone: Not on file    Gets together: Not on file    Attends religious service: Not on file    Active member of club or organization: Not on file    Attends meetings of clubs or organizations: Not on file    Relationship status: Not on file  . Intimate partner violence:    Fear of current or ex partner: Not on file  Emotionally abused: Not on file    Physically abused: Not on file    Forced sexual activity: Not on file  Other Topics Concern  . Not on file  Social History Narrative    67 year old, right-handed, caucasian male with a past medical history of obesity, hypertension, hyperlipidemia, diabetes, obstructive sleep apnea, presenting with frequent nighttime awakenings, excessive daytime sleepiness, also transient confusional episodes.RLS and one beosity, OSA on CPAP with AHI of 3.2 and  setting of 16 cm water , [facility] .      PHYSICAL EXAM  Vitals:   10/18/17 0914  BP: (!) 144/73  Pulse: 70  Weight: 217 lb (98.4 kg)    Height: 5\' 5"  (1.651 m)   Body mass index is 36.11 kg/m.  Generalized: Well developed, in no acute distress   Neurological examination  Mentation: Alert oriented to time, place, history taking. Follows all commands speech and language fluent Cranial nerve II-XII: Pupils were equal round reactive to light. Extraocular movements were full, visual field were full on confrontational test. Facial sensation and strength were normal. Uvula tongue midline. Head turning and shoulder shrug  were normal and symmetric. Motor: The motor testing reveals 5 over 5 strength of all 4 extremities. Good symmetric motor tone is noted throughout.  Sensory: Sensory testing is intact to soft touch on all 4 extremities. No evidence of extinction is noted.  Coordination: Cerebellar testing reveals good finger-nose-finger and heel-to-shin bilaterally.  Gait and station: Gait is normal.  Reflexes: Deep tendon reflexes are symmetric and normal bilaterally.   DIAGNOSTIC DATA (LABS, IMAGING, TESTING) - I reviewed patient records, labs, notes, testing and imaging myself where available.  Lab Results  Component Value Date   WBC 4.5 03/31/2017   HGB 11.7 (L) 03/31/2017   HCT 36.1 (L) 03/31/2017   MCV 92.3 03/31/2017   PLT 214 03/31/2017      Component Value Date/Time   NA 140 07/03/2017 0808   NA 141 10/08/2013 1032   K 4.4 07/03/2017 0808   CL 105 07/03/2017 0808   CO2 28 07/03/2017 0808   GLUCOSE 314 (H) 07/03/2017 0808   BUN 25 07/03/2017 0808   BUN 28 (H) 10/08/2013 1032   CREATININE 1.08 07/03/2017 0808   CALCIUM 9.4 07/03/2017 0808   PROT 6.8 07/03/2017 0808   PROT 6.7 08/19/2015 0914   ALBUMIN 4.5 09/05/2016 0834   ALBUMIN 4.3 08/19/2015 0914   AST 28 07/03/2017 0808   ALT 28 07/03/2017 0808   ALKPHOS 40 09/05/2016 0834   BILITOT 0.3 07/03/2017 0808   BILITOT 0.3 08/19/2015 0914   GFRNONAA 71 07/03/2017 0808   GFRAA 82 07/03/2017 0808   Lab Results  Component Value Date   CHOL 148  08/19/2015   HDL 53 08/19/2015   LDLCALC 80 08/19/2015   TRIG 77 08/19/2015   CHOLHDL 2.8 08/19/2015   Lab Results  Component Value Date   HGBA1C 9.4 (H) 10/08/2017   No results found for: VITAMINB12 Lab Results  Component Value Date   TSH 0.99 10/08/2017      ASSESSMENT AND PLAN 67 y.o. year old male  has a past medical history of Arthritis, Asthma, Colon polyp, Coronary atherosclerosis of native coronary artery, DVT (deep venous thrombosis) ([place]) (2005), Essential hypertension, benign, Headache, History of transfusion, Hypersomnia, Hypothyroidism, Mixed hyperlipidemia, Morbid obesity ([place]), MRSA (methicillin resistant staph aureus) culture positive, Narcolepsy, OSA (obstructive sleep apnea), Pneumonia, Psoriasis, Pulmonary embolism ([place]) (2004), RLS (restless legs syndrome), Rotator cuff disorder, Septic arthritis of knee, left (  [place]), Skin cancer, basal cell, and Type 2 diabetes mellitus ([place]). here with:  1.  Restless leg syndrome 2.  Obstructive sleep apnea on CPAP  The patient will be sent for a mask refitting.  We will adjust his Requip to taking Requip extended release 4 mg at bedtime.  He will take Requip immediate release 10 mg twice a day.  If this is not beneficial for his restless legs he will let us know.  He also reports a remote history of iron deficiency therefore we will check iron levels today.  Advised that if his symptoms worsen or he develop new symptoms he should let us know.  He will follow-up in 6 months or sooner if needed.    Ward Givens, MSN, NP-C 10/18/2017, 9:22 AM [facility] [address], [place], [place] [address] [phone]

## 2017-10-19 ENCOUNTER — Telehealth: Payer: Self-pay | Admitting: *Deleted

## 2017-10-19 LAB — IRON AND TIBC
IRON SATURATION: 19 % (ref 15–55)
Iron: 57 ug/dL (ref 38–169)
Total Iron Binding Capacity: 304 ug/dL (ref 250–450)
UIBC: 247 ug/dL (ref 111–343)

## 2017-10-19 LAB — FERRITIN: Ferritin: 178 ng/mL (ref 30–400)

## 2017-10-19 NOTE — Telephone Encounter (Signed)
LVM informing patient this RN was calling to give him lab results. Advised him the results have been released to his my chart. Advised our office is closed, but if he has questions he may call Monday, left number.

## 2017-10-19 NOTE — Progress Notes (Signed)
Community message sent via Epic to [facility]/ [facility] re: CPAP order for mask refitting.

## 2017-10-21 ENCOUNTER — Other Ambulatory Visit: Payer: Self-pay | Admitting: "Endocrinology

## 2017-11-07 NOTE — Progress Notes (Signed)
Cardiology Office Note  Date: 11/08/2017   ID: Paul Baker, DOB Sep 24, 1950, MRN [id number]  PCP: [name], MD  Primary Cardiologist: [name], MD   Chief Complaint  Patient presents with  . Coronary Artery Disease    History of Present Illness: Paul Baker is a 67 y.o. male last seen in April.  He is here for a routine follow-up visit.  He does not report any progressive angina symptoms or nitroglycerin use.  He tries to exercise at [employer] 3 to 5 days a week.  He reports NYHA class II dyspnea with most activities.  Actually, he has gone back to working part-time in [employer].  We went over his medications which are listed below.  He states that he has not been following with Dr. Wolfgang Phoenix, ran out of both fenofibrate and pravastatin.  He has not had a recent lipid panel.  ECG from earlier this year is reviewed below.  Past Medical History:  Diagnosis Date  . Arthritis   . Asthma   . Colon polyp   . Coronary atherosclerosis of native coronary artery    Diseased nondominant RCA  . DVT (deep venous thrombosis) ([place]) 2005   Right arm  . Essential hypertension, benign   . Headache   . History of transfusion   . Hypersomnia    CPAP of 16 cm, diagnosed with AHI of 60 in 2012,epworth 21- narcolepsy?  [name] Hypothyroidism   . Mixed hyperlipidemia   . Morbid obesity ([place])   . MRSA (methicillin resistant staph aureus) culture positive    08/2012  . Narcolepsy   . OSA (obstructive sleep apnea)    CPAP  . Pneumonia   . Psoriasis   . Pulmonary embolism ([place]) 2004  . RLS (restless legs syndrome)   . Rotator cuff disorder    Left  . Septic arthritis of knee, left ([place])   . Skin cancer, basal cell   . Type 2 diabetes mellitus ([place])     Past Surgical History:  Procedure Laterality Date  . BACK SURGERY    . CATARACT EXTRACTION Bilateral   . COLONOSCOPY    . CYST REMOVAL TRUNK     from back  . EYE SURGERY Left 2016   laser to left eye  .  HIP SURGERY     bone removed from both sides of hip  . KNEE ARTHROTOMY Right 12/04/2014   Procedure: KNEE ARTHROTOMY PATELLA LIGAMENT RECONSTRUSION AND REPAIR RIGHT KNEE;  Surgeon: Paralee Cancel, MD;  Location: [facility];  Service: Orthopedics;  Laterality: Right;  . KNEE SURGERY     X 25 TIMES  . LUMBAR DISC SURGERY     Left L3, L4, L5 discecotomy with decompression of L4 root  . TONSILLECTOMY    . TOTAL KNEE ARTHROPLASTY  2003   LEFT  . TOTAL KNEE ARTHROPLASTY Right 03/23/2014   Procedure: RIGHT TOTAL KNEE ARTHROPLASTY AND REMOVAL RIGHT TIBIAL  DEEP IMPLANT STAPLE;  Surgeon: Paralee Cancel, MD;  [facility];  Service: Orthopedics;  Laterality: Right;  . TOTAL KNEE REVISION  2005   LEFT  . WRIST SURGERY      Current Outpatient Medications  Medication Sig Dispense Refill  . amLODipine (NORVASC) 5 MG tablet Take 1 tablet (5 mg total) by mouth daily. 90 tablet 3  . B-D ULTRAFINE III SHORT PEN 31G X 8 MM MISC USING FIVE TIMES DAILY. 150 each 5  . clonazePAM (KLONOPIN) 1 MG tablet TAKE (1) TABLET BY MOUTH  AT BEDTIME FOR RESTLESS LEGS. 30 tablet 3  . fenofibrate 160 MG tablet TAKE 1 TABLET BY MOUTH AT BEDTIME - 90 tablet 3  . furosemide (LASIX) 40 MG tablet Take 40 mg by mouth daily.     . insulin degludec (TRESIBA FLEXTOUCH) 100 UNIT/ML SOPN FlexTouch Pen Inject 0.92 mLs (92 Units total) into the skin at bedtime. 10 pen 1  . insulin lispro (HUMALOG KWIKPEN) 100 UNIT/ML KiwkPen Inject 0.22-0.28 mLs (22-28 Units total) into the skin 3 (three) times daily. 5 pen 2  . Insulin Syringe-Needle U-100 (INSULIN SYRINGE 1CC/31GX5/16") 31G X 5/16" 1 ML MISC 1 each by Does not apply route 2 (two) times daily. 100 each 2  . isosorbide mononitrate (IMDUR) 60 MG 24 hr tablet TAKE (1/2) TABLET BY MOUTH TWICE DAILY. 90 tablet 3  . levothyroxine (SYNTHROID, LEVOTHROID) 137 MCG tablet TAKE 1 TABLET BY MOUTH ONCE DAILY IN THE MORNING BEFORE BREAKFAST 90 tablet 1  . loratadine (CLARITIN) 10 MG tablet Take 10 mg by  mouth at bedtime as needed for allergies.     . metFORMIN (GLUCOPHAGE) 500 MG tablet Take 1 tablet (500 mg total) by mouth 2 (two) times daily with a meal. 60 tablet 2  . metoprolol succinate (TOPROL-XL) 50 MG 24 hr tablet TAKE 1 TABLET BY MOUTH TWICE DAILY 180 tablet 1  . modafinil (PROVIGIL) 200 MG tablet TAKE 1 TABLET BY MOUTH ONCE DAILY 30 tablet 5  . Multiple Vitamins-Minerals (MULTIVITAMIN ADULTS 50+ PO) Take 1 tablet by mouth daily.    . nitroGLYCERIN (NITROSTAT) 0.4 MG SL tablet DISSOLVE 1 TABLET UNDER TONGUE EVERY 5 MINUTES UP TO 15 MIN FOR CHESTPAIN. IF NO RELIEF CALL 911. 25 tablet 3  . ONE TOUCH ULTRA TEST test strip TEST BLOOD SUGAR UP TO 4 TIMES DAILY. 150 each 5  . ONETOUCH DELICA LANCETS 78G MISC USE AS DIRECTED TO TEST BLOOD SUGAR 4 TIMES DAILY. 150 each 5  . rOPINIRole (REQUIP XL) 4 MG 24 hr tablet Take 1 tablet (4 mg total) by mouth at bedtime. 30 tablet 5  . rOPINIRole (REQUIP) 4 MG tablet Take 0.5 tablets (2 mg total) by mouth 2 (two) times daily. 30 tablet 5  . sodium chloride (OCEAN) 0.65 % SOLN nasal spray Place 1-2 sprays into both nostrils daily as needed for congestion.    . Testosterone Cypionate & Prop 200-20 MG/ML SOLN Inject 1 mL into the muscle every 14 (fourteen) days.    . vitamin B-12 (CYANOCOBALAMIN) 100 MCG tablet Take 100 mcg by mouth daily.    . Vitamin D, Ergocalciferol, (DRISDOL) 50000 units CAPS capsule TAKE 1 CAPSULE BY MOUTH ONCE A WEEK 12 capsule 0  . pravastatin (PRAVACHOL) 20 MG tablet Take 1 tablet (20 mg total) by mouth every evening. 90 tablet 3   No current facility-administered medications for this visit.    Allergies:  Cardizem [diltiazem hcl]; Cardura [doxazosin mesylate]; Gabapentin; Lipitor [atorvastatin]; Paxil [paroxetine hcl]; Adhesive [tape]; and Codeine   Social History: The patient  reports that he quit smoking about 22 years ago. His smoking use included cigarettes. He started smoking about 50 years ago. He has never used smokeless  tobacco. He reports that he does not drink alcohol or use drugs.   ROS:  Please see the history of present illness. Otherwise, complete review of systems is positive for arthritic stiffness.  All other systems are reviewed and negative.   Physical Exam: VS:  BP 136/74 (BP Location: Right Arm)   Pulse 78  Ht 5\' 5"  (1.651 m)   Wt 216 lb (98 kg)   SpO2 96%   BMI 35.94 kg/m , BMI Body mass index is 35.94 kg/m.  Wt Readings from Last 3 Encounters:  11/08/17 216 lb (98 kg)  10/18/17 217 lb (98.4 kg)  10/11/17 217 lb (98.4 kg)    General: Patient appears comfortable at rest. HEENT: Conjunctiva and lids normal, oropharynx clear. Neck: Supple, no elevated JVP or carotid bruits, no thyromegaly. Lungs: Clear to auscultation, nonlabored breathing at rest. Cardiac: Regular rate and rhythm, no S3 or significant systolic murmur. Abdomen: Soft, nontender, bowel sounds present. Extremities: No pitting edema, distal pulses 2+. Skin: Warm and dry. Musculoskeletal: No kyphosis. Neuropsychiatric: Alert and oriented x3, affect grossly appropriate.  ECG: I personally reviewed the tracing from 03/29/2017 which showed sinus rhythm with borderline low voltage.  Recent Labwork: 03/31/2017: Hemoglobin 11.7; Platelets 214 07/03/2017: ALT 28; AST 28; BUN 25; Creat 1.08; Potassium 4.4; Sodium 140 10/08/2017: TSH 0.99     Component Value Date/Time   CHOL 148 08/19/2015 0914   TRIG 77 08/19/2015 0914   HDL 53 08/19/2015 0914   CHOLHDL 2.8 08/19/2015 0914   CHOLHDL 4.3 11/13/2013 0843   VLDL 31 11/13/2013 0843   LDLCALC 80 08/19/2015 0914    Other Studies Reviewed Today:  Chest x-ray 03/29/2017: FINDINGS: Upright AP and lateral views of the chest normal lung volumes. Normal cardiac size and mediastinal contours. Visualized tracheal air column is within normal limits. The lungs are clear. No pneumothorax or pleural effusion. Negative visible bowel gas pattern. No acute osseous abnormality  identified.  IMPRESSION: No acute cardiopulmonary abnormality.  Assessment and Plan:  1.  CAD with diseased nondominant RCA which is being managed medically.  He reports no progressive angina symptoms and we will continue with current regimen.  He will provided for fresh bottle of nitroglycerin.  2.  Mixed hyperlipidemia.  He is off Pravachol and fenofibrate, both will be refilled at previous doses.  Follow-up FLP in 8 weeks.  3.  Essential hypertension, systolic pressure in the 211H today.  No changes made to present medications.  4.  Type 2 diabetes mellitus.  He follows with Dr. [name].  Last hemoglobin A1c was 9.4.  Current medicines were reviewed with the patient today.   Orders Placed This Encounter  Procedures  . Lipid Profile    Disposition: Follow-up in 6 months.  Signed, Satira Sark, MD, [facility] 11/08/2017 1:47 PM    [facility] [address], [place], [place] [address] Phone: [phone]; Fax: [phone]

## 2017-11-08 ENCOUNTER — Ambulatory Visit (INDEPENDENT_AMBULATORY_CARE_PROVIDER_SITE_OTHER): Payer: Medicare Other | Admitting: Cardiology

## 2017-11-08 ENCOUNTER — Encounter: Payer: Self-pay | Admitting: Cardiology

## 2017-11-08 VITALS — BP 136/74 | HR 78 | Ht 65.0 in | Wt 216.0 lb

## 2017-11-08 DIAGNOSIS — E1165 Type 2 diabetes mellitus with hyperglycemia: Secondary | ICD-10-CM | POA: Diagnosis not present

## 2017-11-08 DIAGNOSIS — E782 Mixed hyperlipidemia: Secondary | ICD-10-CM

## 2017-11-08 DIAGNOSIS — E118 Type 2 diabetes mellitus with unspecified complications: Secondary | ICD-10-CM | POA: Diagnosis not present

## 2017-11-08 DIAGNOSIS — IMO0002 Reserved for concepts with insufficient information to code with codable children: Secondary | ICD-10-CM

## 2017-11-08 DIAGNOSIS — I10 Essential (primary) hypertension: Secondary | ICD-10-CM

## 2017-11-08 DIAGNOSIS — I25119 Atherosclerotic heart disease of native coronary artery with unspecified angina pectoris: Secondary | ICD-10-CM

## 2017-11-08 DIAGNOSIS — Z794 Long term (current) use of insulin: Secondary | ICD-10-CM | POA: Diagnosis not present

## 2017-11-08 MED ORDER — NITROGLYCERIN 0.4 MG SL SUBL: SUBLINGUAL_TABLET | SUBLINGUAL | 3 refills | Status: DC

## 2017-11-08 MED ORDER — PRAVASTATIN SODIUM 20 MG PO TABS: 20.0000 mg | ORAL_TABLET | Freq: Every evening | ORAL | 3 refills | Status: DC

## 2017-11-08 MED ORDER — FENOFIBRATE 160 MG PO TABS: ORAL_TABLET | ORAL | 3 refills | Status: DC

## 2017-11-08 NOTE — Patient Instructions (Signed)
Medication Instructions:  Re-start Pravachol 20 mg at dinner  If you need a refill on your cardiac medications before your next appointment, please call your pharmacy.   Lab work: In 8 weeks, repeat Fasting Lipid lab work If you have labs (blood work) drawn today and your tests are completely normal, you will receive your results only by: [name] MyChart Message (if you have MyChart) OR . A paper copy in the mail If you have any lab test that is abnormal or we need to change your treatment, we will call you to review the results.  Testing/Procedures: None  Follow-Up: At [facility], you and your health needs are our priority.  As part of our continuing mission to provide you with exceptional heart care, we have created designated Provider Care Teams.  These Care Teams include your primary Cardiologist (physician) and Advanced Practice Providers (APPs -  Physician Assistants and Nurse Practitioners) who all work together to provide you with the care you need, when you need it. You will need a follow up appointment in 6 months.  Please call our office 2 months in advance to schedule this appointment.  You may see Paul Lesches, MD or one of the following Advanced Practice Providers on your designated Care Team:   Paul Pho, PA-C [facility]) . Paul Barrios, PA-C ([place])  Any Other Special Instructions Will Be Listed Below (If Applicable). NONE

## 2017-11-12 DIAGNOSIS — Z23 Encounter for immunization: Secondary | ICD-10-CM | POA: Diagnosis not present

## 2017-11-12 DIAGNOSIS — S60221A Contusion of right hand, initial encounter: Secondary | ICD-10-CM | POA: Diagnosis not present

## 2017-11-19 ENCOUNTER — Other Ambulatory Visit: Payer: Self-pay | Admitting: "Endocrinology

## 2017-11-22 ENCOUNTER — Telehealth: Payer: Self-pay | Admitting: "Endocrinology

## 2017-11-22 MED ORDER — METFORMIN HCL 500 MG PO TABS: 500.0000 mg | ORAL_TABLET | Freq: Two times a day (BID) | ORAL | 2 refills | Status: DC

## 2017-11-22 NOTE — Telephone Encounter (Signed)
Kymir is asking for a refill on metFORMIN (GLUCOPHAGE) 500 MG tablet 90 day supply to be sent to [facility] in [place], please advise?

## 2017-11-28 DIAGNOSIS — H02834 Dermatochalasis of left upper eyelid: Secondary | ICD-10-CM | POA: Diagnosis not present

## 2017-11-28 DIAGNOSIS — Z961 Presence of intraocular lens: Secondary | ICD-10-CM | POA: Diagnosis not present

## 2017-11-28 DIAGNOSIS — H02831 Dermatochalasis of right upper eyelid: Secondary | ICD-10-CM | POA: Diagnosis not present

## 2017-11-28 DIAGNOSIS — E119 Type 2 diabetes mellitus without complications: Secondary | ICD-10-CM | POA: Diagnosis not present

## 2017-12-05 ENCOUNTER — Encounter: Payer: Medicare Other | Attending: "Endocrinology | Admitting: Nutrition

## 2017-12-05 ENCOUNTER — Encounter: Payer: Self-pay | Admitting: Nutrition

## 2017-12-05 VITALS — Ht 65.0 in | Wt 220.0 lb

## 2017-12-05 DIAGNOSIS — E119 Type 2 diabetes mellitus without complications: Secondary | ICD-10-CM | POA: Diagnosis not present

## 2017-12-05 DIAGNOSIS — IMO0002 Reserved for concepts with insufficient information to code with codable children: Secondary | ICD-10-CM

## 2017-12-05 DIAGNOSIS — E782 Mixed hyperlipidemia: Secondary | ICD-10-CM

## 2017-12-05 DIAGNOSIS — E1165 Type 2 diabetes mellitus with hyperglycemia: Secondary | ICD-10-CM

## 2017-12-05 DIAGNOSIS — Z713 Dietary counseling and surveillance: Secondary | ICD-10-CM | POA: Diagnosis not present

## 2017-12-05 DIAGNOSIS — I10 Essential (primary) hypertension: Secondary | ICD-10-CM

## 2017-12-05 DIAGNOSIS — I2510 Atherosclerotic heart disease of native coronary artery without angina pectoris: Secondary | ICD-10-CM

## 2017-12-05 DIAGNOSIS — E118 Type 2 diabetes mellitus with unspecified complications: Secondary | ICD-10-CM

## 2017-12-05 DIAGNOSIS — E1159 Type 2 diabetes mellitus with other circulatory complications: Secondary | ICD-10-CM

## 2017-12-05 NOTE — Progress Notes (Signed)
  Medical Nutrition Therapy:  Appt start time: 0800 end time:  0900.  Assessment:  Primary concerns today: DM Typd 2. CAD. LIves with wife.  He and his wife do the shoppping and cooking together.  Tresiba 82 units at night, Novolog 22 units with sliding scale with meals. Libre. Metformin 500 mg BID.  Physical activity; [employer] 30-60 minutes.. Hasn't been going over the holidays, Admits he needs to get back to working out, watching portion sizes. He has been trying to cut out carbs at meals.   Preferred Learning Style:   No preference indicated   Learning Readiness:  Ready  Change in progress  MEDICATIONS:   DIETARY INTAKE:  24-hr recall:  B ( AM): 2 eggs, 1 slice toast,  Black coffee  Snk ( AM):   L ( PM): Taco souip with beans 1 cup, 5 crackers, water Snk ( PM):  D ( PM): Hamburger with 1 slice bun,  Toss salad with vinegaretee, water Snk ( PM):  Beverages: water  Usual physical activity: Exerccises some.  Estimated energy needs: 1600  calories 180 g carbohydrates 120 g protein 44 g fat  Progress Towards Goal(s):  In progress.   Nutritional Diagnosis:  NB-1.1 Food and nutrition-related knowledge deficit As related to DIabetes Type 2.  As evidenced by A1C 9.4%.    Intervention:  Nutrition and Diabetes education provided on My Plate, CHO counting, meal planning, portion sizes, timing of meals, avoiding snacks between meals unless having a low blood sugar, target ranges for A1C and blood sugars, signs/symptoms and treatment of hyper/hypoglycemia, monitoring blood sugars, taking medications as prescribed, benefits of exercising 30 minutes per day and prevention of complications of DM. Marland KitchenGoals 1. Follow My Plate-30-45 carbs per meal. 2. Increase fresh fruits and vegetables. 3. Increase fiber foods  Drink gallon of water.  Get back to working 60 minutes 4 times per week. Get A1C down to 8%  Teaching Method Utilized:  Visual Auditory Hands on  Handouts  given during visit include:  The Plate Method   Meal Plan Card   Barriers to learning/adherence to lifestyle change: none  Demonstrated degree of understanding via:  Teach Back   Monitoring/Evaluation:  Dietary intake, exercise, meal planning , and body weight in 2 month(s).

## 2017-12-05 NOTE — Patient Instructions (Signed)
Goals 1. Follow My Plate-30-45 carbs per meal. 2. Increase fresh fruits and vegetables. 3. Increase fiber foods  Drink gallon of water.  Get back to working 60 minutes 4 times per week. Get A1C down to 8%

## 2017-12-31 ENCOUNTER — Other Ambulatory Visit: Payer: Self-pay | Admitting: Neurology

## 2018-01-09 ENCOUNTER — Ambulatory Visit: Payer: Self-pay | Admitting: Family Medicine

## 2018-01-09 ENCOUNTER — Encounter: Payer: Self-pay | Admitting: Physician Assistant

## 2018-01-09 VITALS — BP 130/70 | HR 68 | Temp 98.5°F | Resp 10 | Wt 223.4 lb

## 2018-01-09 DIAGNOSIS — R059 Cough, unspecified: Secondary | ICD-10-CM

## 2018-01-09 DIAGNOSIS — R05 Cough: Secondary | ICD-10-CM

## 2018-01-09 DIAGNOSIS — Z8709 Personal history of other diseases of the respiratory system: Secondary | ICD-10-CM

## 2018-01-09 MED ORDER — BENZONATATE 100 MG PO CAPS: 100.0000 mg | ORAL_CAPSULE | Freq: Three times a day (TID) | ORAL | 0 refills | Status: DC | PRN

## 2018-01-09 MED ORDER — ALBUTEROL SULFATE HFA 108 (90 BASE) MCG/ACT IN AERS: 2.0000 | INHALATION_SPRAY | Freq: Four times a day (QID) | RESPIRATORY_TRACT | 0 refills | Status: DC | PRN

## 2018-01-09 NOTE — Patient Instructions (Signed)
Upper Respiratory Infection, Adult  Follow up with [facility] Dr. Domenic Polite Continue over the counter cough medication Increase hydration Use medications prescribed today for cough  An upper respiratory infection (URI) affects the nose, throat, and upper air passages. URIs are caused by germs (viruses). The most common type of URI is often called "the common cold." Medicines cannot cure URIs, but you can do things at home to relieve your symptoms. URIs usually get better within 7-10 days. Follow these instructions at home: Activity  Rest as needed.  If you have a fever, stay home from work or school until your fever is gone, or until your doctor says you may return to work or school. ? You should stay home until you cannot spread the infection anymore (you are not contagious). ? Your doctor may have you wear a face mask so you have less risk of spreading the infection. Relieving symptoms  Gargle with a salt-water mixture 3-4 times a day or as needed. To make a salt-water mixture, completely dissolve -1 tsp of salt in 1 cup of warm water.  Use a cool-mist humidifier to add moisture to the air. This can help you breathe more easily. Eating and drinking   Drink enough fluid to keep your pee (urine) pale yellow.  Eat soups and other clear broths. General instructions   Take over-the-counter and prescription medicines only as told by your doctor. These include cold medicines, fever reducers, and cough suppressants.  Do not use any products that contain nicotine or tobacco. These include cigarettes and e-cigarettes. If you need help quitting, ask your doctor.  Avoid being where people are smoking (avoid secondhand smoke).  Make sure you get regular shots and get the flu shot every year.  Keep all follow-up visits as told by your doctor. This is important. How to avoid spreading infection to others   Wash your hands often with soap and water. If you do not have soap and water, use  hand sanitizer.  Avoid touching your mouth, face, eyes, or nose.  Cough or sneeze into a tissue or your sleeve or elbow. Do not cough or sneeze into your hand or into the air. Contact a doctor if:  You are getting worse, not better.  You have any of these: ? A fever. ? Chills. ? Brown or red mucus in your nose. ? Yellow or brown fluid (discharge)coming from your nose. ? Pain in your face, especially when you bend forward. ? Swollen neck glands. ? Pain with swallowing. ? White areas in the back of your throat. Get help right away if:  You have shortness of breath that gets worse.  You have very bad or constant: ? Headache. ? Ear pain. ? Pain in your forehead, behind your eyes, and over your cheekbones (sinus pain). ? Chest pain.  You have long-lasting (chronic) lung disease along with any of these: ? Wheezing. ? Long-lasting cough. ? Coughing up blood. ? A change in your usual mucus.  You have a stiff neck.  You have changes in your: ? Vision. ? Hearing. ? Thinking. ? Mood. Summary  An upper respiratory infection (URI) is caused by a germ called a virus. The most common type of URI is often called "the common cold."  URIs usually get better within 7-10 days.  Take over-the-counter and prescription medicines only as told by your doctor. This information is not intended to replace advice given to you by your health care provider. Make sure you discuss any questions you have  with your health care provider. Document Released: 06/07/2007 Document Revised: 08/11/2016 Document Reviewed: 08/11/2016 Elsevier Interactive Patient Education  2019 [employer].

## 2018-01-09 NOTE — Progress Notes (Signed)
Subjective:   Subjective:  Chief Complaint:  Paul Baker is a 68 y.o. male presenting for chief complaint of chest cold with productive cough yellow thick mucous (x4days (delsym)) Reports 4 day history of rhinorrhea, sore throat, productive cough (no hemoptysis ) and wheezing, subjective fever, night sweats and fatigue. Has tried Delsym over the counter for relief and has a positive history of seasonal allergies, history of asthma.  Denies any other aggravating or relieving factors, no other questions or concerns or household/social contact with similar symptoms but does report a grandson with confirmed strep but not cough or congestion.  Review of Systems  Constitutional: Negative for chills, fever and malaise/fatigue.  HENT: Positive for sore throat. Negative for congestion, ear discharge, ear pain and sinus pain.   Eyes: Negative.   Respiratory: Positive for cough and sputum production. Negative for shortness of breath.   Cardiovascular: Negative.  Negative for chest pain.  Gastrointestinal: Negative for abdominal pain, diarrhea, nausea and vomiting.  Genitourinary: Negative for dysuria, frequency, hematuria and urgency.  Musculoskeletal: Negative for myalgias.  Skin: Negative.   Neurological: Negative for headaches.  Endo/Heme/Allergies: Negative.   Psychiatric/Behavioral: Negative.     Paul Baker has a current medication list which includes the following prescription(s): albuterol, amlodipine, b-d ultrafine iii short pen, benzonatate, clonazepam, fenofibrate, furosemide, insulin degludec, insulin lispro, insulin syringe 1cc/31gx5/16", isosorbide mononitrate, levothyroxine, loratadine, metformin, metoprolol succinate, modafinil, multiple vitamins-minerals, nitroglycerin, one touch ultra test, onetouch delica lancets 44R, pravastatin, ropinirole, ropinirole, sodium chloride, testosterone cypionate & prop, vitamin b-12, and vitamin d (ergocalciferol). Also is allergic to cardizem  [diltiazem hcl]; cardura [doxazosin mesylate]; gabapentin; lipitor [atorvastatin]; paxil [paroxetine hcl]; adhesive [tape]; and codeine.  Paul Baker  has a past medical history of Arthritis, Asthma, Colon polyp, Coronary atherosclerosis of native coronary artery, DVT (deep venous thrombosis) ([place]) (2005), Essential hypertension, benign, Headache, History of transfusion, Hypersomnia, Hypothyroidism, Mixed hyperlipidemia, Morbid obesity ([place]), MRSA (methicillin resistant staph aureus) culture positive, Narcolepsy, OSA (obstructive sleep apnea), Pneumonia, Psoriasis, Pulmonary embolism ([place]) (2004), RLS (restless legs syndrome), Rotator cuff disorder, Septic arthritis of knee, left ([place]), Skin cancer, basal cell, and Type 2 diabetes mellitus ([place]). Also  has a past surgical history that includes Total knee arthroplasty (2003); Lumbar disc surgery; Wrist surgery; Total knee revision (2005); Colonoscopy; Back surgery; Knee surgery; Total knee arthroplasty (Right, 03/23/2014); Tonsillectomy; Hip surgery; Eye surgery (Left, 2016); Cataract extraction (Bilateral); Knee arthrotomy (Right, 12/04/2014); and Cyst removal trunk.   Objective:   Vitals: BP 130/70   Pulse 68   Temp 98.5 F (36.9 C)   Resp 10   Wt 223 lb 6.4 oz (101.3 kg)   SpO2 96%   BMI 37.18 kg/m   Physical Exam Vitals signs reviewed.  Constitutional:      Appearance: He is well-developed. He is not toxic-appearing.  HENT:     Head: Normocephalic.     Right Ear: Hearing, tympanic membrane, ear canal and external ear normal.     Left Ear: Hearing, tympanic membrane, ear canal and external ear normal.     Nose: Rhinorrhea present.     Right Sinus: No maxillary sinus tenderness or frontal sinus tenderness.     Left Sinus: No maxillary sinus tenderness or frontal sinus tenderness.     Mouth/Throat:     Pharynx: Uvula midline.  Neck:     Musculoskeletal: Normal range of motion and neck supple.  Cardiovascular:     Rate and Rhythm: Normal  rate and regular rhythm.     Pulses: Normal pulses.  Heart sounds: Normal heart sounds.  Pulmonary:     Effort: Pulmonary effort is normal.     Breath sounds: Examination of the right-upper field reveals rhonchi. Examination of the left-upper field reveals rhonchi. Rhonchi present. No decreased breath sounds, wheezing or rales.     Comments: Respirations: 16 bpm (taken by provider) Audible non productive cough on exam non intractable intermittent- no evidence of SOB with episode. Patient able to take a deep breath-cap refill is brisk- patient able to speak in complete sentences.  Abdominal:     General: Bowel sounds are normal.     Palpations: Abdomen is soft.  Musculoskeletal: Normal range of motion.  Lymphadenopathy:     Head:     Right side of head: No submental, submandibular or tonsillar adenopathy.     Left side of head: No submental, submandibular or tonsillar adenopathy.     Cervical: No cervical adenopathy.  Neurological:     Mental Status: He is alert and oriented to person, place, and time.     No results found for this or any previous visit (from the past 24 hour(s)).  Assessment and Plan :  1. Cough Due to patients complicated medical history discussed the need for further evaluation for this condition if symptoms persist. Discussed the supportive care nature of treatment today and follow up with Cardiology office if congestion does not improve and or patient experiences chest symptoms. Patient and wife present agrees with plan of care and verbalized understanding of the reason for recommended comprehensive evaluation of symptoms to differentiate symptoms if no improvement. Patient advised that he could continue to use over the counter cough medication and increase water intake. Treatment plan today focused on options for cough. - benzonatate (TESSALON) 100 MG capsule; Take 1 capsule (100 mg total) by mouth 3 (three) times daily as needed for cough.  Dispense: 30 capsule;  Refill: 0 - albuterol (PROVENTIL HFA;VENTOLIN HFA) 108 (90 Base) MCG/ACT inhaler; Inhale 2 puffs into the lungs every 6 (six) hours as needed for wheezing or shortness of breath.  Dispense: 1 Inhaler; Refill: 0  2. History of asthma - albuterol (PROVENTIL HFA;VENTOLIN HFA) 108 (90 Base) MCG/ACT inhaler; Inhale 2 puffs into the lungs every 6 (six) hours as needed for wheezing or shortness of breath.  Dispense: 1 Inhaler; Refill: 0     01/09/2018 10:39 AM

## 2018-01-13 ENCOUNTER — Encounter

## 2018-01-14 ENCOUNTER — Encounter

## 2018-01-14 ENCOUNTER — Other Ambulatory Visit: Payer: Self-pay | Admitting: Cardiology

## 2018-01-14 ENCOUNTER — Encounter: Payer: Self-pay | Admitting: Cardiology

## 2018-01-14 ENCOUNTER — Ambulatory Visit (INDEPENDENT_AMBULATORY_CARE_PROVIDER_SITE_OTHER): Payer: Medicare HMO | Admitting: Cardiology

## 2018-01-14 ENCOUNTER — Other Ambulatory Visit (HOSPITAL_COMMUNITY)
Admission: RE | Admit: 2018-01-14 | Discharge: 2018-01-14 | Disposition: A | Discharge status: Home / Self Care | Payer: Medicare HMO | Source: Ambulatory Visit | Attending: Cardiology | Admitting: Cardiology

## 2018-01-14 ENCOUNTER — Ambulatory Visit (HOSPITAL_COMMUNITY)
Admission: RE | Admit: 2018-01-14 | Discharge: 2018-01-14 | Disposition: A | Discharge status: Home / Self Care | Payer: Medicare HMO | Source: Ambulatory Visit | Attending: Cardiology | Admitting: Cardiology

## 2018-01-14 VITALS — BP 142/74 | HR 71 | Ht 65.0 in | Wt 220.0 lb

## 2018-01-14 DIAGNOSIS — E118 Type 2 diabetes mellitus with unspecified complications: Secondary | ICD-10-CM

## 2018-01-14 DIAGNOSIS — I200 Unstable angina: Secondary | ICD-10-CM | POA: Diagnosis not present

## 2018-01-14 DIAGNOSIS — E782 Mixed hyperlipidemia: Secondary | ICD-10-CM

## 2018-01-14 DIAGNOSIS — I10 Essential (primary) hypertension: Secondary | ICD-10-CM | POA: Diagnosis not present

## 2018-01-14 DIAGNOSIS — Z794 Long term (current) use of insulin: Secondary | ICD-10-CM | POA: Diagnosis not present

## 2018-01-14 DIAGNOSIS — I25119 Atherosclerotic heart disease of native coronary artery with unspecified angina pectoris: Secondary | ICD-10-CM | POA: Diagnosis not present

## 2018-01-14 DIAGNOSIS — E1165 Type 2 diabetes mellitus with hyperglycemia: Secondary | ICD-10-CM

## 2018-01-14 DIAGNOSIS — Z01818 Encounter for other preprocedural examination: Secondary | ICD-10-CM | POA: Insufficient documentation

## 2018-01-14 DIAGNOSIS — E1159 Type 2 diabetes mellitus with other circulatory complications: Secondary | ICD-10-CM | POA: Diagnosis not present

## 2018-01-14 DIAGNOSIS — R0609 Other forms of dyspnea: Secondary | ICD-10-CM | POA: Diagnosis not present

## 2018-01-14 DIAGNOSIS — J984 Other disorders of lung: Secondary | ICD-10-CM | POA: Diagnosis not present

## 2018-01-14 DIAGNOSIS — IMO0002 Reserved for concepts with insufficient information to code with codable children: Secondary | ICD-10-CM

## 2018-01-14 LAB — CBC WITH DIFFERENTIAL/PLATELET
Abs Immature Granulocytes: 0.1 10*3/uL — ABNORMAL HIGH (ref 0.00–0.07)
Basophils Absolute: 0.1 10*3/uL (ref 0.0–0.1)
Basophils Relative: 1 %
Eosinophils Absolute: 0.3 10*3/uL (ref 0.0–0.5)
Eosinophils Relative: 4 %
HCT: 40.8 % (ref 39.0–52.0)
Hemoglobin: 12.6 g/dL — ABNORMAL LOW (ref 13.0–17.0)
Immature Granulocytes: 1 %
Lymphocytes Relative: 19 %
Lymphs Abs: 1.6 10*3/uL (ref 0.7–4.0)
MCH: 28.6 pg (ref 26.0–34.0)
MCHC: 30.9 g/dL (ref 30.0–36.0)
MCV: 92.7 fL (ref 80.0–100.0)
Monocytes Absolute: 0.9 10*3/uL (ref 0.1–1.0)
Monocytes Relative: 11 %
NEUTROS ABS: 5.2 10*3/uL (ref 1.7–7.7)
Neutrophils Relative %: 64 %
Platelets: 282 10*3/uL (ref 150–400)
RBC: 4.4 MIL/uL (ref 4.22–5.81)
RDW: 13 % (ref 11.5–15.5)
WBC: 8.3 10*3/uL (ref 4.0–10.5)
nRBC: 0 % (ref 0.0–0.2)

## 2018-01-14 LAB — BASIC METABOLIC PANEL WITH GFR
Anion gap: 7 (ref 5–15)
BUN: 21 mg/dL (ref 8–23)
CO2: 26 mmol/L (ref 22–32)
Calcium: 9.2 mg/dL (ref 8.9–10.3)
Chloride: 102 mmol/L (ref 98–111)
Creatinine, Ser: 1.2 mg/dL (ref 0.61–1.24)
GFR calc Af Amer: >60 mL/min
GFR calc non Af Amer: >60 mL/min
Glucose, Bld: 171 mg/dL — ABNORMAL HIGH (ref 70–99)
Potassium: 4.3 mmol/L (ref 3.5–5.1)
Sodium: 135 mmol/L (ref 135–145)

## 2018-01-14 NOTE — Patient Instructions (Addendum)
Medication Instructions:  Your physician recommends that you continue on your current medications as directed. Please refer to the Current Medication list given to you today.  Labwork: bmet   Testing/Procedures: Your physician has requested that you have a cardiac catheterization. Cardiac catheterization is used to diagnose and/or treat various heart conditions. Doctors may recommend this procedure for a number of different reasons. The most common reason is to evaluate chest pain. Chest pain can be a symptom of coronary artery disease (CAD), and cardiac catheterization can show whether plaque is narrowing or blocking your heart's arteries. This procedure is also used to evaluate the valves, as well as measure the blood flow and oxygen levels in different parts of your heart. For further information please visit [email or web address]. Please follow instruction sheet, as given.  A chest x-ray takes a picture of the organs and structures inside the chest, including the heart, lungs, and blood vessels. This test can show several things, including, whether the heart is enlarges; whether fluid is building up in the lungs; and whether pacemaker / defibrillator leads are still in place.   Follow-Up:  Your physician recommends that you schedule a follow-up appointment in: 1 month   Any Other Special Instructions Will Be Listed Below (If Applicable).    [place] [address] Dept: [phone] Loc: [place]  01/14/2018  You are scheduled for a Cardiac Catheterization on Thursday, January 16 with Dr. Daneen Schick.  1. Please arrive at the [facility] (Main Entrance A) at [facility]: [address] [place], [place] [address] at 7:00 AM (This time is two hours before your procedure to ensure your preparation). Free valet parking service is available.   Special note:  Every effort is made to have your procedure done on time. Please understand that emergencies sometimes delay scheduled procedures.  2. Diet: Do not eat solid foods after midnight.  The patient may have clear liquids until 5am upon the day of the procedure  3. Labs: today       CHEST X-RAY TODAY   4. Medication instructions in preparation for your procedure:   Contrast Allergy: No  Take only 1/2 dose of insulin the night before and none the morning of procedure.   Do not take Diabetes Med Glucophage (Metformin) on the day of the procedure and HOLD 48 HOURS AFTER THE PROCEDURE.  On the morning of your procedure, take your Aspirin and any morning medicines NOT listed above.  You may use sips of water.  5. Plan for one night stay--bring personal belongings. 6. Bring a current list of your medications and current insurance cards. 7. You MUST have a responsible person to drive you home. 8. Someone MUST be with you the first 24 hours after you arrive home or your discharge will be delayed. 9. Please wear clothes that are easy to get on and off and wear slip-on shoes.  Thank you for allowing [place] to care for you!   -- [facility]      If you need a refill on your cardiac medications before your next appointment, please call your pharmacy.

## 2018-01-14 NOTE — Progress Notes (Signed)
Cardiology Office Note  Date: 01/14/2018   ID: Paul Baker, DOB Apr 25, 1950, MRN [id number]  PCP: [name], MD  Primary Cardiologist: [name], MD   Chief Complaint  Patient presents with  . Coronary Artery Disease    History of Present Illness: Paul Baker is a 68 y.o. male last seen in November 2019.  He presents for a follow-up visit, referred back after recent urgent care visit with shortness of breath and cough.  There was thought that he might have an upper respiratory tract infection versus asthma based on review of the records, but he was referred here as well for assessment.  We discussed his symptoms in detail today.  He tells me that since our last encounter he has been notably more short of breath with activities, NYHA class II-III.  Also experiencing increasing frequency angina and use of nitroglycerin.  He otherwise reports compliance with his medications.  He was exercising at [employer] but stopped prior to the holidays and has been concerned about starting back due to his shortness of breath.  The recent cough has improved and has not been related specifically to his dyspnea on exertion.  He underwent cardiac catheterization back in 2011 that demonstrated a 90% stenosis within small nondominant RCA that was managed medically, otherwise mild nonobstructive atherosclerosis in the left coronary system.  Current cardiac regimen includes Norvasc, Lasix, Imdur, Toprol-XL, aspirin, and Pravachol.  Past Medical History:  Diagnosis Date  . Arthritis   . Asthma   . Colon polyp   . Coronary atherosclerosis of native coronary artery    Diseased nondominant RCA  . DVT (deep venous thrombosis) ([place]) 2005   Right arm  . Essential hypertension, benign   . Headache   . History of transfusion   . Hypersomnia    CPAP of 16 cm, diagnosed with AHI of 60 in 2012,epworth 21- narcolepsy?  [name] Hypothyroidism   . Mixed hyperlipidemia   . Morbid obesity  ([place])   . MRSA (methicillin resistant staph aureus) culture positive    08/2012  . Narcolepsy   . OSA (obstructive sleep apnea)    CPAP  . Pneumonia   . Psoriasis   . Pulmonary embolism ([place]) 2004  . RLS (restless legs syndrome)   . Rotator cuff disorder    Left  . Septic arthritis of knee, left ([place])   . Skin cancer, basal cell   . Type 2 diabetes mellitus ([place])     Past Surgical History:  Procedure Laterality Date  . BACK SURGERY    . CATARACT EXTRACTION Bilateral   . COLONOSCOPY    . CYST REMOVAL TRUNK     from back  . EYE SURGERY Left 2016   laser to left eye  . HIP SURGERY     bone removed from both sides of hip  . KNEE ARTHROTOMY Right 12/04/2014   Procedure: KNEE ARTHROTOMY PATELLA LIGAMENT RECONSTRUSION AND REPAIR RIGHT KNEE;  Surgeon: Paralee Cancel, MD;  Location: [facility];  Service: Orthopedics;  Laterality: Right;  . KNEE SURGERY     X 25 TIMES  . LUMBAR DISC SURGERY     Left L3, L4, L5 discecotomy with decompression of L4 root  . TONSILLECTOMY    . TOTAL KNEE ARTHROPLASTY  2003   LEFT  . TOTAL KNEE ARTHROPLASTY Right 03/23/2014   Procedure: RIGHT TOTAL KNEE ARTHROPLASTY AND REMOVAL RIGHT TIBIAL  DEEP IMPLANT STAPLE;  Surgeon: Paralee Cancel, MD;  Location: [facility];  Service:  Orthopedics;  Laterality: Right;  . TOTAL KNEE REVISION  2005   LEFT  . WRIST SURGERY      Current Outpatient Medications  Medication Sig Dispense Refill  . albuterol (PROVENTIL HFA;VENTOLIN HFA) 108 (90 Base) MCG/ACT inhaler Inhale 2 puffs into the lungs Baker 6 (six) hours as needed for wheezing or shortness of breath. 1 Inhaler 0  . amLODipine (NORVASC) 5 MG tablet Take 1 tablet (5 mg total) by mouth daily. 90 tablet 3  . B-D ULTRAFINE III SHORT PEN 31G X 8 MM MISC USING FIVE TIMES DAILY. 150 each 5  . benzonatate (TESSALON) 100 MG capsule Take 1 capsule (100 mg total) by mouth 3 (three) times daily as needed for cough. 30 capsule 0  . clonazePAM (KLONOPIN) 1 MG tablet TAKE (1) TABLET BY  MOUTH AT BEDTIME FOR RESTLESS LEGS. 30 tablet 3  . fenofibrate 160 MG tablet TAKE 1 TABLET BY MOUTH AT BEDTIME - 90 tablet 3  . furosemide (LASIX) 40 MG tablet Take 40 mg by mouth daily.     . insulin degludec (TRESIBA FLEXTOUCH) 100 UNIT/ML SOPN FlexTouch Pen Inject 0.92 mLs (92 Units total) into the skin at bedtime. 10 pen 1  . insulin lispro (HUMALOG KWIKPEN) 100 UNIT/ML KiwkPen Inject 0.22-0.28 mLs (22-28 Units total) into the skin 3 (three) times daily. 5 pen 2  . Insulin Syringe-Needle U-100 (INSULIN SYRINGE 1CC/31GX5/16") 31G X 5/16" 1 ML MISC 1 each by Does not apply route 2 (two) times daily. 100 each 2  . isosorbide mononitrate (IMDUR) 60 MG 24 hr tablet TAKE (1/2) TABLET BY MOUTH TWICE DAILY. 90 tablet 3  . levothyroxine (SYNTHROID, LEVOTHROID) 137 MCG tablet TAKE 1 TABLET BY MOUTH ONCE DAILY IN THE MORNING BEFORE BREAKFAST 90 tablet 1  . loratadine (CLARITIN) 10 MG tablet Take 10 mg by mouth at bedtime as needed for allergies.     . metFORMIN (GLUCOPHAGE) 500 MG tablet Take 1 tablet (500 mg total) by mouth 2 (two) times daily with a meal. 60 tablet 2  . metoprolol succinate (TOPROL-XL) 50 MG 24 hr tablet TAKE 1 TABLET BY MOUTH TWICE DAILY 180 tablet 1  . modafinil (PROVIGIL) 200 MG tablet TAKE 1 TABLET BY MOUTH ONCE DAILY 30 tablet 0  . Multiple Vitamins-Minerals (MULTIVITAMIN ADULTS 50+ PO) Take 1 tablet by mouth daily.    . nitroGLYCERIN (NITROSTAT) 0.4 MG SL tablet DISSOLVE 1 TABLET UNDER TONGUE Baker 5 MINUTES UP TO 15 MIN FOR CHESTPAIN. IF NO RELIEF CALL 911. 25 tablet 3  . ONE TOUCH ULTRA TEST test strip TEST BLOOD SUGAR UP TO 4 TIMES DAILY. 150 each 5  . ONETOUCH DELICA LANCETS 52D MISC USE AS DIRECTED TO TEST BLOOD SUGAR 4 TIMES DAILY. 150 each 5  . pravastatin (PRAVACHOL) 20 MG tablet Take 1 tablet (20 mg total) by mouth Baker evening. 90 tablet 3  . rOPINIRole (REQUIP XL) 4 MG 24 hr tablet Take 1 tablet (4 mg total) by mouth at bedtime. 30 tablet 5  . rOPINIRole (REQUIP) 4 MG  tablet Take 0.5 tablets (2 mg total) by mouth 2 (two) times daily. 30 tablet 5  . sodium chloride (OCEAN) 0.65 % SOLN nasal spray Place 1-2 sprays into both nostrils daily as needed for congestion.    . Testosterone Cypionate & Prop 200-20 MG/ML SOLN Inject 1 mL into the muscle Baker 14 (fourteen) days.    . vitamin B-12 (CYANOCOBALAMIN) 100 MCG tablet Take 100 mcg by mouth daily.    . Vitamin D,  Ergocalciferol, (DRISDOL) 1.25 MG (50000 UT) CAPS capsule TAKE 1 CAPSULE BY MOUTH ONCE A WEEK 12 capsule 0   No current facility-administered medications for this visit.    Allergies:  Cardizem [diltiazem hcl]; Cardura [doxazosin mesylate]; Gabapentin; Lipitor [atorvastatin]; Paxil [paroxetine hcl]; Adhesive [tape]; and Codeine   Social History: The patient  reports that he quit smoking about 23 years ago. His smoking use included cigarettes. He started smoking about 50 years ago. He has never used smokeless tobacco. He reports that he does not drink alcohol or use drugs.   Family History: The patient's family history includes Diabetes in his sister; Heart attack in his father; Hypertension in his father; Kidney Stones in his father; Narcolepsy in his grandchild; Seizures in his grandchild.   ROS:  Please see the history of present illness. Otherwise, complete review of systems is positive for none.  All other systems are reviewed and negative.   Physical Exam: VS:  BP (!) 142/74 (BP Location: Right Arm)   Pulse 71   Ht 5\' 5"  (1.651 m)   Wt 220 lb (99.8 kg)   SpO2 94%   BMI 36.61 kg/m , BMI Body mass index is 36.61 kg/m.  Wt Readings from Last 3 Encounters:  01/14/18 220 lb (99.8 kg)  01/09/18 223 lb 6.4 oz (101.3 kg)  12/05/17 220 lb (99.8 kg)    General: Obese male, appears comfortable at rest. HEENT: Conjunctiva and lids normal, oropharynx clear. Neck: Supple, no elevated JVP or carotid bruits, no thyromegaly. Lungs: Decreased breath sounds but no wheezing, nonlabored breathing at  rest. Cardiac: Regular rate and rhythm, no S3, soft systolic murmur, no pericardial rub. Abdomen: Soft, nontender, bowel sounds present. Extremities: Mild lower leg edema, distal pulses 2+. Skin: Warm and dry. Musculoskeletal: No kyphosis. Neuropsychiatric: Alert and oriented x3, affect grossly appropriate.  ECG: I personally reviewed the tracing from 03/29/2017 which showed sinus rhythm with borderline low voltage.  Recent Labwork: 03/31/2017: Hemoglobin 11.7; Platelets 214 07/03/2017: ALT 28; AST 28; BUN 25; Creat 1.08; Potassium 4.4; Sodium 140 10/08/2017: TSH 0.99     Component Value Date/Time   CHOL 148 08/19/2015 0914   TRIG 77 08/19/2015 0914   HDL 53 08/19/2015 0914   CHOLHDL 2.8 08/19/2015 0914   CHOLHDL 4.3 11/13/2013 0843   VLDL 31 11/13/2013 0843   LDLCALC 80 08/19/2015 0914    Assessment and Plan:  1.  Progressive dyspnea on exertion and angina symptoms in a 68 year old male with known CAD, type 2 diabetes mellitus, hypertension, and hyperlipidemia.  He also has asthma with intermittent cough and possible recent URI, but the aforementioned symptoms have been ongoing in the background.  He had a 90% stenosed small nondominant RCA as of 2011 with mild left system disease.  We discussed options for further evaluation and at this point following lab work and a chest x-ray, we will plan to pursue a follow-up diagnostic cardiac catheterization, left and right heart.  We have discussed the risks and benefits and he is in agreement to proceed.  This will help to guide further potential medication adjustments/treatment options.  2.  Mixed hyperlipidemia, currently on Pravachol with follow-up per PCP.  3.  Essential hypertension, on Norvasc and Toprol-XL.  4.  OSA on CPAP.  5.  Type 2 diabetes mellitus, followed by PCP.  Hemoglobin A1c 9.4% in October 2019.  Current medicines were reviewed with the patient today.   Orders Placed This Encounter  Procedures  . DG Chest 2 View   .  Basic Metabolic Panel (BMET)  . CBC with Differential    Disposition: Follow-up after procedure.  Signed, Satira Sark, MD, [facility] 01/14/2018 1:52 PM    [facility] at [facility] [address] [place], [place] [address] Phone: [phone]; Fax: [phone]

## 2018-01-14 NOTE — H&P (View-Only) (Signed)
Cardiology Office Note  Date: 01/14/2018   ID: Paul Baker, DOB [birth year], MRN [id number]  PCP: [name], MD  Primary Cardiologist: [name], MD   Chief Complaint  Patient presents with  . Coronary Artery Disease    History of Present Illness: Paul Baker is a 68 y.o. male last seen in November 2019.  He presents for a follow-up visit, referred back after recent urgent care visit with shortness of breath and cough.  There was thought that he might have an upper respiratory tract infection versus asthma based on review of the records, but he was referred here as well for assessment.  We discussed his symptoms in detail today.  He tells me that since our last encounter he has been notably more short of breath with activities, NYHA class II-III.  Also experiencing increasing frequency angina and use of nitroglycerin.  He otherwise reports compliance with his medications.  He was exercising at [employer] but stopped prior to the holidays and has been concerned about starting back due to his shortness of breath.  The recent cough has improved and has not been related specifically to his dyspnea on exertion.  He underwent cardiac catheterization back in 2011 that demonstrated a 90% stenosis within small nondominant RCA that was managed medically, otherwise mild nonobstructive atherosclerosis in the left coronary system.  Current cardiac regimen includes Norvasc, Lasix, Imdur, Toprol-XL, aspirin, and Pravachol.  Past Medical History:  Diagnosis Date  . Arthritis   . Asthma   . Colon polyp   . Coronary atherosclerosis of native coronary artery    Diseased nondominant RCA  . DVT (deep venous thrombosis) ([place]) 2005   Right arm  . Essential hypertension, benign   . Headache   . History of transfusion   . Hypersomnia    CPAP of 16 cm, diagnosed with AHI of 60 in 2012,epworth 21- narcolepsy?  [name] Hypothyroidism   . Mixed hyperlipidemia   . Morbid obesity  ([place])   . MRSA (methicillin resistant staph aureus) culture positive    08/2012  . Narcolepsy   . OSA (obstructive sleep apnea)    CPAP  . Pneumonia   . Psoriasis   . Pulmonary embolism ([place]) 2004  . RLS (restless legs syndrome)   . Rotator cuff disorder    Left  . Septic arthritis of knee, left ([place])   . Skin cancer, basal cell   . Type 2 diabetes mellitus ([place])     Past Surgical History:  Procedure Laterality Date  . BACK SURGERY    . CATARACT EXTRACTION Bilateral   . COLONOSCOPY    . CYST REMOVAL TRUNK     from back  . EYE SURGERY Left 2016   laser to left eye  . HIP SURGERY     bone removed from both sides of hip  . KNEE ARTHROTOMY Right 12/04/2014   Procedure: KNEE ARTHROTOMY PATELLA LIGAMENT RECONSTRUSION AND REPAIR RIGHT KNEE;  Surgeon: Paralee Cancel, MD;  Location: [facility];  Service: Orthopedics;  Laterality: Right;  . KNEE SURGERY     X 25 TIMES  . LUMBAR DISC SURGERY     Left L3, L4, L5 discecotomy with decompression of L4 root  . TONSILLECTOMY    . TOTAL KNEE ARTHROPLASTY  2003   LEFT  . TOTAL KNEE ARTHROPLASTY Right 03/23/2014   Procedure: RIGHT TOTAL KNEE ARTHROPLASTY AND REMOVAL RIGHT TIBIAL  DEEP IMPLANT STAPLE;  Surgeon: Paralee Cancel, MD;  Location[facility];  Service:  Orthopedics;  Laterality: Right;  . TOTAL KNEE REVISION  2005   LEFT  . WRIST SURGERY      Current Outpatient Medications  Medication Sig Dispense Refill  . albuterol (PROVENTIL HFA;VENTOLIN HFA) 108 (90 Base) MCG/ACT inhaler Inhale 2 puffs into the lungs every 6 (six) hours as needed for wheezing or shortness of breath. 1 Inhaler 0  . amLODipine (NORVASC) 5 MG tablet Take 1 tablet (5 mg total) by mouth daily. 90 tablet 3  . B-D ULTRAFINE III SHORT PEN 31G X 8 MM MISC USING FIVE TIMES DAILY. 150 each 5  . benzonatate (TESSALON) 100 MG capsule Take 1 capsule (100 mg total) by mouth 3 (three) times daily as needed for cough. 30 capsule 0  . clonazePAM (KLONOPIN) 1 MG tablet TAKE (1) TABLET BY  MOUTH AT BEDTIME FOR RESTLESS LEGS. 30 tablet 3  . fenofibrate 160 MG tablet TAKE 1 TABLET BY MOUTH AT BEDTIME - 90 tablet 3  . furosemide (LASIX) 40 MG tablet Take 40 mg by mouth daily.     . insulin degludec (TRESIBA FLEXTOUCH) 100 UNIT/ML SOPN FlexTouch Pen Inject 0.92 mLs (92 Units total) into the skin at bedtime. 10 pen 1  . insulin lispro (HUMALOG KWIKPEN) 100 UNIT/ML KiwkPen Inject 0.22-0.28 mLs (22-28 Units total) into the skin 3 (three) times daily. 5 pen 2  . Insulin Syringe-Needle U-100 (INSULIN SYRINGE 1CC/31GX5/16") 31G X 5/16" 1 ML MISC 1 each by Does not apply route 2 (two) times daily. 100 each 2  . isosorbide mononitrate (IMDUR) 60 MG 24 hr tablet TAKE (1/2) TABLET BY MOUTH TWICE DAILY. 90 tablet 3  . levothyroxine (SYNTHROID, LEVOTHROID) 137 MCG tablet TAKE 1 TABLET BY MOUTH ONCE DAILY IN THE MORNING BEFORE BREAKFAST 90 tablet 1  . loratadine (CLARITIN) 10 MG tablet Take 10 mg by mouth at bedtime as needed for allergies.     . metFORMIN (GLUCOPHAGE) 500 MG tablet Take 1 tablet (500 mg total) by mouth 2 (two) times daily with a meal. 60 tablet 2  . metoprolol succinate (TOPROL-XL) 50 MG 24 hr tablet TAKE 1 TABLET BY MOUTH TWICE DAILY 180 tablet 1  . modafinil (PROVIGIL) 200 MG tablet TAKE 1 TABLET BY MOUTH ONCE DAILY 30 tablet 0  . Multiple Vitamins-Minerals (MULTIVITAMIN ADULTS 50+ PO) Take 1 tablet by mouth daily.    . nitroGLYCERIN (NITROSTAT) 0.4 MG SL tablet DISSOLVE 1 TABLET UNDER TONGUE EVERY 5 MINUTES UP TO 15 MIN FOR CHESTPAIN. IF NO RELIEF CALL 911. 25 tablet 3  . ONE TOUCH ULTRA TEST test strip TEST BLOOD SUGAR UP TO 4 TIMES DAILY. 150 each 5  . ONETOUCH DELICA LANCETS 78H MISC USE AS DIRECTED TO TEST BLOOD SUGAR 4 TIMES DAILY. 150 each 5  . pravastatin (PRAVACHOL) 20 MG tablet Take 1 tablet (20 mg total) by mouth every evening. 90 tablet 3  . rOPINIRole (REQUIP XL) 4 MG 24 hr tablet Take 1 tablet (4 mg total) by mouth at bedtime. 30 tablet 5  . rOPINIRole (REQUIP) 4 MG  tablet Take 0.5 tablets (2 mg total) by mouth 2 (two) times daily. 30 tablet 5  . sodium chloride (OCEAN) 0.65 % SOLN nasal spray Place 1-2 sprays into both nostrils daily as needed for congestion.    . Testosterone Cypionate & Prop 200-20 MG/ML SOLN Inject 1 mL into the muscle every 14 (fourteen) days.    . vitamin B-12 (CYANOCOBALAMIN) 100 MCG tablet Take 100 mcg by mouth daily.    . Vitamin D,  Ergocalciferol, (DRISDOL) 1.25 MG (50000 UT) CAPS capsule TAKE 1 CAPSULE BY MOUTH ONCE A WEEK 12 capsule 0   No current facility-administered medications for this visit.    Allergies:  Cardizem [diltiazem hcl]; Cardura [doxazosin mesylate]; Gabapentin; Lipitor [atorvastatin]; Paxil [paroxetine hcl]; Adhesive [tape]; and Codeine   Social History: The patient  reports that he quit smoking about 23 years ago. His smoking use included cigarettes. He started smoking about 50 years ago. He has never used smokeless tobacco. He reports that he does not drink alcohol or use drugs.   Family History: The patient's family history includes Diabetes in his sister; Heart attack in his father; Hypertension in his father; Kidney Stones in his father; Narcolepsy in his grandchild; Seizures in his grandchild.   ROS:  Please see the history of present illness. Otherwise, complete review of systems is positive for none.  All other systems are reviewed and negative.   Physical Exam: VS:  BP (!) 142/74 (BP Location: Right Arm)   Pulse 71   Ht 5\' 5"  (1.651 m)   Wt 220 lb (99.8 kg)   SpO2 94%   BMI 36.61 kg/m , BMI Body mass index is 36.61 kg/m.  Wt Readings from Last 3 Encounters:  01/14/18 220 lb (99.8 kg)  01/09/18 223 lb 6.4 oz (101.3 kg)  12/05/17 220 lb (99.8 kg)    General: Obese male, appears comfortable at rest. HEENT: Conjunctiva and lids normal, oropharynx clear. Neck: Supple, no elevated JVP or carotid bruits, no thyromegaly. Lungs: Decreased breath sounds but no wheezing, nonlabored breathing at  rest. Cardiac: Regular rate and rhythm, no S3, soft systolic murmur, no pericardial rub. Abdomen: Soft, nontender, bowel sounds present. Extremities: Mild lower leg edema, distal pulses 2+. Skin: Warm and dry. Musculoskeletal: No kyphosis. Neuropsychiatric: Alert and oriented x3, affect grossly appropriate.  ECG: I personally reviewed the tracing from 03/29/2017 which showed sinus rhythm with borderline low voltage.  Recent Labwork: 03/31/2017: Hemoglobin 11.7; Platelets 214 07/03/2017: ALT 28; AST 28; BUN 25; Creat 1.08; Potassium 4.4; Sodium 140 10/08/2017: TSH 0.99     Component Value Date/Time   CHOL 148 08/19/2015 0914   TRIG 77 08/19/2015 0914   HDL 53 08/19/2015 0914   CHOLHDL 2.8 08/19/2015 0914   CHOLHDL 4.3 11/13/2013 0843   VLDL 31 11/13/2013 0843   LDLCALC 80 08/19/2015 0914    Assessment and Plan:  1.  Progressive dyspnea on exertion and angina symptoms in a 68 year old male with known CAD, type 2 diabetes mellitus, hypertension, and hyperlipidemia.  He also has asthma with intermittent cough and possible recent URI, but the aforementioned symptoms have been ongoing in the background.  He had a 90% stenosed small nondominant RCA as of 2011 with mild left system disease.  We discussed options for further evaluation and at this point following lab work and a chest x-ray, we will plan to pursue a follow-up diagnostic cardiac catheterization, left and right heart.  We have discussed the risks and benefits and he is in agreement to proceed.  This will help to guide further potential medication adjustments/treatment options.  2.  Mixed hyperlipidemia, currently on Pravachol with follow-up per PCP.  3.  Essential hypertension, on Norvasc and Toprol-XL.  4.  OSA on CPAP.  5.  Type 2 diabetes mellitus, followed by PCP.  Hemoglobin A1c 9.4% in October 2019.  Current medicines were reviewed with the patient today.   Orders Placed This Encounter  Procedures  . DG Chest 2 View   .  Basic Metabolic Panel (BMET)  . CBC with Differential    Disposition: Follow-up after procedure.  Signed, Satira Sark, MD, [facility] 01/14/2018 1:52 PM    [facility] at [facility] [address] [place], [place] [address] Phone: [phone]; Fax: [phone]

## 2018-01-15 ENCOUNTER — Encounter: Payer: Self-pay | Admitting: "Endocrinology

## 2018-01-15 ENCOUNTER — Ambulatory Visit (INDEPENDENT_AMBULATORY_CARE_PROVIDER_SITE_OTHER): Payer: Medicare HMO | Admitting: "Endocrinology

## 2018-01-15 ENCOUNTER — Encounter: Payer: Medicare HMO | Attending: Family Medicine | Admitting: Nutrition

## 2018-01-15 ENCOUNTER — Encounter: Payer: Self-pay | Admitting: Nutrition

## 2018-01-15 VITALS — Ht 65.0 in | Wt 223.0 lb

## 2018-01-15 VITALS — BP 141/81 | HR 75 | Ht 65.0 in | Wt 223.0 lb

## 2018-01-15 DIAGNOSIS — E038 Other specified hypothyroidism: Secondary | ICD-10-CM

## 2018-01-15 DIAGNOSIS — E782 Mixed hyperlipidemia: Secondary | ICD-10-CM

## 2018-01-15 DIAGNOSIS — E1165 Type 2 diabetes mellitus with hyperglycemia: Secondary | ICD-10-CM | POA: Insufficient documentation

## 2018-01-15 DIAGNOSIS — E669 Obesity, unspecified: Secondary | ICD-10-CM | POA: Insufficient documentation

## 2018-01-15 DIAGNOSIS — I10 Essential (primary) hypertension: Secondary | ICD-10-CM | POA: Diagnosis not present

## 2018-01-15 DIAGNOSIS — E1159 Type 2 diabetes mellitus with other circulatory complications: Secondary | ICD-10-CM | POA: Diagnosis not present

## 2018-01-15 DIAGNOSIS — E118 Type 2 diabetes mellitus with unspecified complications: Secondary | ICD-10-CM | POA: Insufficient documentation

## 2018-01-15 DIAGNOSIS — IMO0002 Reserved for concepts with insufficient information to code with codable children: Secondary | ICD-10-CM

## 2018-01-15 LAB — COMPLETE METABOLIC PANEL WITH GFR
AG Ratio: 1.7 (calc) (ref 1.0–2.5)
ALT: 12 U/L (ref 9–46)
AST: 13 U/L (ref 10–35)
Albumin: 4.1 g/dL (ref 3.6–5.1)
Alkaline phosphatase (APISO): 44 U/L (ref 40–115)
BUN: 22 mg/dL (ref 7–25)
CALCIUM: 9.2 mg/dL (ref 8.6–10.3)
CO2: 25 mmol/L (ref 20–32)
CREATININE: 1.01 mg/dL (ref 0.70–1.25)
Chloride: 105 mmol/L (ref 98–110)
GFR, EST NON AFRICAN AMERICAN: 77 mL/min/{1.73_m2} (ref >=60)
GFR, Est African American: 89 mL/min/{1.73_m2} (ref >=60)
Globulin: 2.4 g/dL (calc) (ref 1.9–3.7)
Glucose, Bld: 181 mg/dL — ABNORMAL HIGH (ref 65–99)
Potassium: 4.6 mmol/L (ref 3.5–5.3)
Sodium: 137 mmol/L (ref 135–146)
Total Bilirubin: 0.2 mg/dL (ref 0.2–1.2)
Total Protein: 6.5 g/dL (ref 6.1–8.1)

## 2018-01-15 LAB — LIPID PANEL
CHOL/HDL RATIO: 3.1 (calc) (ref <5.0)
Cholesterol: 152 mg/dL (ref <200)
HDL: 49 mg/dL (ref >40)
LDL CHOLESTEROL (CALC): 83 mg/dL
NON-HDL CHOLESTEROL (CALC): 103 mg/dL (ref <130)
Triglycerides: 107 mg/dL (ref <150)

## 2018-01-15 LAB — HEMOGLOBIN A1C
EAG (MMOL/L): 12 (calc)
Hgb A1c MFr Bld: 9.2 % of total Hgb — ABNORMAL HIGH (ref <5.7)
MEAN PLASMA GLUCOSE: 217 (calc)

## 2018-01-15 NOTE — Patient Instructions (Signed)
.  Goals Look into more plant based diet [place] Drink water  Avoid processed foods  Cut out eating after supper Cut out snacks and high sodium foods  Take insulin as prescribed

## 2018-01-15 NOTE — Progress Notes (Signed)
Medical Nutrition Therapy:  Appt start time: 1000 end time 1030  Assessment:  Primary concerns today: DM Typd 2. CAD. LIves with wife.  He and his wife do the shoppping and cooking together.  Tresiba 82 units at night but was supppose to be taking 92 units a day.  Novolog 22 units with sliding scale with meals. Libre. Metformin 500 mg BID.  Physical activity; [employer] on hold due to cold and heart  issues. .. Hasn't been going over the holidays, Admits he needs to get back to working out, watching portion sizes. He has been trying to down  out carbs at meals.  Admits to snacking later in the eveings causing elevated blood sugars.  He notes he has a lot of swelling right now in hands, feet and SOB at times. Has appt with heart md on this Thursday.   Lab Results  Component Value Date   HGBA1C 9.2 (H) 01/14/2018   CMP Latest Ref Rng & Units 01/14/2018 01/14/2018 07/03/2017  Glucose 70 - 99 mg/dL 171(H) 181(H) 314(H)  BUN 8 - 23 mg/dL 21 22 25   Creatinine 0.61 - 1.24 mg/dL 1.20 1.01 1.08  Sodium 135 - 145 mmol/L 135 137 140  Potassium 3.5 - 5.1 mmol/L 4.3 4.6 4.4  Chloride 98 - 111 mmol/L 102 105 105  CO2 22 - 32 mmol/L 26 25 28   Calcium 8.9 - 10.3 mg/dL 9.2 9.2 9.4  Total Protein 6.1 - 8.1 g/dL - 6.5 6.8  Total Bilirubin 0.2 - 1.2 mg/dL - 0.2 0.3  Alkaline Phos 40 - 115 U/L - - -  AST 10 - 35 U/L - 13 28  ALT 9 - 46 U/L - 12 28   Lipid Panel     Component Value Date/Time   CHOL 152 01/14/2018 0749   CHOL 148 08/19/2015 0914   TRIG 107 01/14/2018 0749   HDL 49 01/14/2018 0749   HDL 53 08/19/2015 0914   CHOLHDL 3.1 01/14/2018 0749   VLDL 31 11/13/2013 0843   LDLCALC 83 01/14/2018 0749      Preferred Learning Style:   No preference indicated   Learning Readiness:  Ready  Change in progress  MEDICATIONS:   DIETARY INTAKE:  24-hr recall:  B ( AM): 2 eggs, oatmeal, ,  Black coffee  Snk ( AM):   L ( PM): Taco souip with beans 1 cup, 5 crackers, water Snk (  PM):  D ( PM): Hamburger with 1 slice bun,  Toss salad with vinegaretee, water Snk ( PM):  Beverages: water  Usual physical activity: Exerccises some.  Estimated energy needs: 1600  calories 180 g carbohydrates 120 g protein 44 g fat  Progress Towards Goal(s):  In progress.   Nutritional Diagnosis:  NB-1.1 Food and nutrition-related knowledge deficit As related to DIabetes Type 2.  As evidenced by A1C 9.4%.    Intervention:  Nutrition and Diabetes education provided on My Plate, CHO counting, meal planning, portion sizes, timing of meals, avoiding snacks between meals unless having a low blood sugar, target ranges for A1C and blood sugars, signs/symptoms and treatment of hyper/hypoglycemia, monitoring blood sugars, taking medications as prescribed, benefits of exercising 30 minutes per day and prevention of complications of DM.   Marland KitchenGoals Look into more plant based diet [place] Drink water  Avoid processed foods  Cut out eating after supper Cut out snacks and high sodium foods  Take insulin as prescribed  Teaching Method Utilized:  Visual Auditory Hands on  Handouts  given during visit include:  The Plate Method   Meal Plan Card   Barriers to learning/adherence to lifestyle change: none  Demonstrated degree of understanding via:  Teach Back   Monitoring/Evaluation:  Dietary intake, exercise, meal planning , and body weight in 3  month(s).

## 2018-01-15 NOTE — Progress Notes (Signed)
Endocrinology follow-up note   Subjective:    Patient ID: Paul Baker, male    DOB: Apr 20, 1950, PCP [name], MD   Past Medical History:  Diagnosis Date  . Arthritis   . Asthma   . Colon polyp   . Coronary atherosclerosis of native coronary artery    Diseased nondominant RCA  . DVT (deep venous thrombosis) ([place]) 2005   Right arm  . Essential hypertension, benign   . Headache   . History of transfusion   . Hypersomnia    CPAP of 16 cm, diagnosed with AHI of 60 in 2012,[place] 21- narcolepsy?  [name] Hypothyroidism   . Mixed hyperlipidemia   . Morbid obesity ([place])   . MRSA (methicillin resistant staph aureus) culture positive    08/2012  . Narcolepsy   . OSA (obstructive sleep apnea)    CPAP  . Pneumonia   . Psoriasis   . Pulmonary embolism ([place]) 2004  . RLS (restless legs syndrome)   . Rotator cuff disorder    Left  . Septic arthritis of knee, left ([place])   . Skin cancer, basal cell   . Type 2 diabetes mellitus ([place])    Past Surgical History:  Procedure Laterality Date  . BACK SURGERY    . CATARACT EXTRACTION Bilateral   . COLONOSCOPY    . CYST REMOVAL TRUNK     from back  . EYE SURGERY Left 2016   laser to left eye  . HIP SURGERY     bone removed from both sides of hip  . KNEE ARTHROTOMY Right 12/04/2014   Procedure: KNEE ARTHROTOMY PATELLA LIGAMENT RECONSTRUSION AND REPAIR RIGHT KNEE;  Surgeon: Paralee Cancel, MD;  Location: [facility];  Service: Orthopedics;  Laterality: Right;  . KNEE SURGERY     X 25 TIMES  . LUMBAR DISC SURGERY     Left L3, L4, L5 discecotomy with decompression of L4 root  . TONSILLECTOMY    . TOTAL KNEE ARTHROPLASTY  2003   LEFT  . TOTAL KNEE ARTHROPLASTY Right 03/23/2014   Procedure: RIGHT TOTAL KNEE ARTHROPLASTY AND REMOVAL RIGHT TIBIAL  DEEP IMPLANT STAPLE;  Surgeon: Paralee Cancel, MD;  Location: [facility];  Service: Orthopedics;  Laterality: Right;  . TOTAL KNEE REVISION  2005   LEFT  . WRIST SURGERY     Social History    Socioeconomic History  . Marital status: Married    Spouse name: Toney Reil  . Number of children: 2  . Years of education: college  . Highest education level: Not on file  Occupational History  . Occupation: Disabled    Fish farm manager: UNEMPLOYED  Social Needs  . Financial resource strain: Not on file  . Food insecurity:    Worry: Not on file    Inability: Not on file  . Transportation needs:    Medical: Not on file    Non-medical: Not on file  Tobacco Use  . Smoking status: Former Smoker    Types: Cigarettes    Start date: 06/19/1967    Last attempt to quit: 01/03/1995    Years since quitting: 23.0  . Smokeless tobacco: Never Used  Substance and Sexual Activity  . Alcohol use: No    Alcohol/week: 0.0 standard drinks    Comment: quit drinking in 07/86  . Drug use: No  . Sexual activity: Yes    Partners: Female  Lifestyle  . Physical activity:    Days per week: Not on file    Minutes per session:  Not on file  . Stress: Not on file  Relationships  . Social connections:    Talks on phone: Not on file    Gets together: Not on file    Attends religious service: Not on file    Active member of club or organization: Not on file    Attends meetings of clubs or organizations: Not on file    Relationship status: Not on file  Other Topics Concern  . Not on file  Social History Narrative    68 year old, right-handed, caucasian male with a past medical history of obesity, hypertension, hyperlipidemia, diabetes, obstructive sleep apnea, presenting with frequent nighttime awakenings, excessive daytime sleepiness, also transient confusional episodes.RLS and one beosity, OSA on CPAP with AHI of 3.2 and  setting of 16 cm water , [facility] .   Outpatient Encounter Medications as of 01/15/2018  Medication Sig  . albuterol (PROVENTIL HFA;VENTOLIN HFA) 108 (90 Base) MCG/ACT inhaler Inhale 2 puffs into the lungs every 6 (six) hours as needed for wheezing or shortness of breath.  [name] amLODipine  (NORVASC) 5 MG tablet Take 1 tablet (5 mg total) by mouth daily.  . B-D ULTRAFINE III SHORT PEN 31G X 8 MM MISC USING FIVE TIMES DAILY.  . benzonatate (TESSALON) 100 MG capsule Take 1 capsule (100 mg total) by mouth 3 (three) times daily as needed for cough.  . clonazePAM (KLONOPIN) 1 MG tablet TAKE (1) TABLET BY MOUTH AT BEDTIME FOR RESTLESS LEGS.  . fenofibrate 160 MG tablet TAKE 1 TABLET BY MOUTH AT BEDTIME -  . furosemide (LASIX) 40 MG tablet Take 40 mg by mouth daily.   . insulin degludec (TRESIBA FLEXTOUCH) 100 UNIT/ML SOPN FlexTouch Pen Inject 0.92 mLs (92 Units total) into the skin at bedtime.  . insulin lispro (HUMALOG KWIKPEN) 100 UNIT/ML KiwkPen Inject 0.22-0.28 mLs (22-28 Units total) into the skin 3 (three) times daily.  . Insulin Syringe-Needle U-100 (INSULIN SYRINGE 1CC/31GX5/16") 31G X 5/16" 1 ML MISC 1 each by Does not apply route 2 (two) times daily.  . isosorbide mononitrate (IMDUR) 60 MG 24 hr tablet TAKE (1/2) TABLET BY MOUTH TWICE DAILY.  [name] levothyroxine (SYNTHROID, LEVOTHROID) 137 MCG tablet TAKE 1 TABLET BY MOUTH ONCE DAILY IN THE MORNING BEFORE BREAKFAST  . loratadine (CLARITIN) 10 MG tablet Take 10 mg by mouth at bedtime as needed for allergies.   . metFORMIN (GLUCOPHAGE) 500 MG tablet Take 1 tablet (500 mg total) by mouth 2 (two) times daily with a meal.  . metoprolol succinate (TOPROL-XL) 50 MG 24 hr tablet TAKE 1 TABLET BY MOUTH TWICE DAILY  . modafinil (PROVIGIL) 200 MG tablet TAKE 1 TABLET BY MOUTH ONCE DAILY  . Multiple Vitamins-Minerals (MULTIVITAMIN ADULTS 50+ PO) Take 1 tablet by mouth daily.  . nitroGLYCERIN (NITROSTAT) 0.4 MG SL tablet DISSOLVE 1 TABLET UNDER TONGUE EVERY 5 MINUTES UP TO 15 MIN FOR CHESTPAIN. IF NO RELIEF CALL 911.  [name] ONE TOUCH ULTRA TEST test strip TEST BLOOD SUGAR UP TO 4 TIMES DAILY.  [name] ONETOUCH DELICA LANCETS 61P MISC USE AS DIRECTED TO TEST BLOOD SUGAR 4 TIMES DAILY.  . pravastatin (PRAVACHOL) 20 MG tablet Take 1 tablet (20 mg total) by  mouth every evening.  [name] rOPINIRole (REQUIP XL) 4 MG 24 hr tablet Take 1 tablet (4 mg total) by mouth at bedtime.  [name] rOPINIRole (REQUIP) 4 MG tablet Take 0.5 tablets (2 mg total) by mouth 2 (two) times daily.  . sodium chloride (OCEAN) 0.65 % SOLN nasal spray Place  1-2 sprays into both nostrils daily as needed for congestion.  . Testosterone Cypionate & Prop 200-20 MG/ML SOLN Inject 1 mL into the muscle every 14 (fourteen) days.  . vitamin B-12 (CYANOCOBALAMIN) 100 MCG tablet Take 100 mcg by mouth daily.  . Vitamin D, Ergocalciferol, (DRISDOL) 1.25 MG (50000 UT) CAPS capsule TAKE 1 CAPSULE BY MOUTH ONCE A WEEK   No facility-administered encounter medications on file as of 01/15/2018.    ALLERGIES: Allergies  Allergen Reactions  . Cardizem [Diltiazem Hcl]     Edema   . Cardura [Doxazosin Mesylate]     Side effects  . Gabapentin     Rash  . Lipitor [Atorvastatin] Other (See Comments)    Leg cramps  . Paxil [Paroxetine Hcl]     Side effects   . Adhesive [Tape] Rash  . Codeine Rash and Other (See Comments)    Headache    VACCINATION STATUS: Immunization History  Administered Date(s) Administered  . Influenza Split 09/18/2012  . Influenza,inj,Quad PF,6+ Mos 10/13/2013, 10/06/2014  . Influenza-Unspecified 12/01/2015, 11/08/2016  . Pneumococcal Polysaccharide-23 10/02/2004, 10/03/2011  . Td 05/24/2006    Diabetes  He presents for his follow-up diabetic visit. He has type 2 diabetes mellitus. Onset time: Was diagnosed at approximate age of 14 years. His disease course has been stable. There are no hypoglycemic associated symptoms. Pertinent negatives for hypoglycemia include no confusion, headaches, pallor or seizures. Associated symptoms include polydipsia and polyuria. Pertinent negatives for diabetes include no chest pain, no fatigue, no polyphagia and no weakness. There are no hypoglycemic complications. Symptoms are stable. Diabetic complications include heart disease. Risk factors  for coronary artery disease include diabetes mellitus, dyslipidemia, male sex, obesity, sedentary lifestyle and tobacco exposure. Current diabetic treatment includes intensive insulin program. He is compliant with treatment most of the time. His weight is increasing steadily. He is following a generally unhealthy diet. He has had a previous visit with a dietitian. He never participates in exercise. His home blood glucose trend is fluctuating minimally. His breakfast blood glucose range is generally >200 mg/dl. His lunch blood glucose range is generally 180-200 mg/dl. His dinner blood glucose range is generally 180-200 mg/dl. His overall blood glucose range is >200 mg/dl. (His previsit labs show A1c of 9.2%, unchanged from his last time.) An ACE inhibitor/angiotensin II receptor blocker is being taken. He sees a podiatrist.Eye exam is current.  Hyperlipidemia  This is a chronic problem. The current episode started more than 1 year ago. The problem is controlled. Exacerbating diseases include diabetes, hypothyroidism and obesity. Pertinent negatives include no chest pain, myalgias or shortness of breath. Current antihyperlipidemic treatment includes statins. Risk factors for coronary artery disease include dyslipidemia, diabetes mellitus, hypertension, male sex, obesity and a sedentary lifestyle.  Hypertension  This is a chronic problem. The current episode started more than 1 year ago. The problem is controlled. Pertinent negatives include no chest pain, headaches, neck pain, palpitations or shortness of breath. Risk factors for coronary artery disease include diabetes mellitus, dyslipidemia, male gender, obesity and sedentary lifestyle. Past treatments include angiotensin blockers. Hypertensive end-organ damage includes CAD/MI. Identifiable causes of hypertension include a thyroid problem.  Thyroid Problem  Presents for follow-up visit. Patient reports no constipation, diarrhea, fatigue or palpitations. The  symptoms have been improving. Past treatments include levothyroxine. His past medical history is significant for diabetes and hyperlipidemia.     Review of Systems  Constitutional: Negative for fatigue and unexpected weight change.  HENT: Negative for dental problem, mouth sores and  trouble swallowing.   Eyes: Negative for visual disturbance.  Respiratory: Negative for cough, choking, chest tightness, shortness of breath and wheezing.   Cardiovascular: Negative for chest pain, palpitations and leg swelling.  Gastrointestinal: Negative for abdominal distention, abdominal pain, constipation, diarrhea, nausea and vomiting.  Endocrine: Positive for polydipsia and polyuria. Negative for polyphagia.  Genitourinary: Negative for dysuria, flank pain, hematuria and urgency.  Musculoskeletal: Negative for back pain, gait problem, myalgias and neck pain.  Skin: Negative for pallor, rash and wound.  Neurological: Negative for seizures, syncope, weakness, numbness and headaches.  Psychiatric/Behavioral: Negative.  Negative for confusion and dysphoric mood.    Objective:    BP (!) 141/81   Pulse 75   Ht 5\' 5"  (1.651 m)   Wt 223 lb (101.2 kg)   BMI 37.11 kg/m   Wt Readings from Last 3 Encounters:  01/15/18 223 lb (101.2 kg)  01/14/18 220 lb (99.8 kg)  01/09/18 223 lb 6.4 oz (101.3 kg)    Physical Exam  Constitutional: He is oriented to person, place, and time. He appears well-developed. He is cooperative. No distress.  HENT:  Head: Normocephalic and atraumatic.  Eyes: EOM are normal.  Neck: Normal range of motion. Neck supple. No tracheal deviation present. No thyromegaly present.  Cardiovascular: Normal rate, S1 normal and S2 normal. Exam reveals no gallop.  No murmur heard. Pulses:      Dorsalis pedis pulses are 1+ on the right side and 1+ on the left side.       Posterior tibial pulses are 1+ on the right side and 1+ on the left side.  Pulmonary/Chest: Effort normal. No respiratory  distress. He has no wheezes.  Abdominal: He exhibits no distension. There is no abdominal tenderness. There is no guarding and no CVA tenderness.  Musculoskeletal:        General: No edema.     Right shoulder: He exhibits no swelling and no deformity.  Neurological: He is alert and oriented to person, place, and time. He has normal strength and normal reflexes. No cranial nerve deficit or sensory deficit. Gait normal.  Skin: Skin is warm and dry. No rash noted. No cyanosis. Nails show no clubbing.  Psychiatric: He has a normal mood and affect. His speech is normal. Judgment normal. Cognition and memory are normal.   CMP Latest Ref Rng & Units 01/14/2018 01/14/2018 07/03/2017  Glucose 70 - 99 mg/dL 171(H) 181(H) 314(H)  BUN 8 - 23 mg/dL 21 22 25   Creatinine 0.61 - 1.24 mg/dL 1.20 1.01 1.08  Sodium 135 - 145 mmol/L 135 137 140  Potassium 3.5 - 5.1 mmol/L 4.3 4.6 4.4  Chloride 98 - 111 mmol/L 102 105 105  CO2 22 - 32 mmol/L 26 25 28   Calcium 8.9 - 10.3 mg/dL 9.2 9.2 9.4  Total Protein 6.1 - 8.1 g/dL - 6.5 6.8  Total Bilirubin 0.2 - 1.2 mg/dL - 0.2 0.3  Alkaline Phos 40 - 115 U/L - - -  AST 10 - 35 U/L - 13 28  ALT 9 - 46 U/L - 12 28     Diabetic Labs (most recent): Lab Results  Component Value Date   HGBA1C 9.2 (H) 01/14/2018   HGBA1C 9.4 (H) 10/08/2017   HGBA1C 8.5 (H) 07/03/2017   Lipid Panel     Component Value Date/Time   CHOL 152 01/14/2018 0749   CHOL 148 08/19/2015 0914   TRIG 107 01/14/2018 0749   HDL 49 01/14/2018 0749   HDL 53 08/19/2015 0914  CHOLHDL 3.1 01/14/2018 0749   VLDL 31 11/13/2013 0843   LDLCALC 83 01/14/2018 0749    Results for FAHAD, CISSE (MRN [id number]) as of 01/15/2018 09:46  Ref. Range 12/21/2016 08:41 10/08/2017 11:09  TSH Latest Ref Range: 0.40 - 4.50 mIU/L 1.24 0.99  T4,Free(Direct) Latest Ref Range: 0.8 - 1.8 ng/dL 1.3 1.4    Assessment & Plan:   1. Uncontrolled type 2 diabetes mellitus with circulatory complication, with long-term  current use of insulin (HCC)  -His diabetes is  complicated by coronary artery disease and patient remains at an extremely high risk for more acute and chronic complications of diabetes which include CAD, CVA, CKD, retinopathy, and neuropathy. These are all discussed in detail with the patient.  -He denies overdosing insulin or taking any other anti-diabetes medications.    -He returns with A1c at 9.2%, unchanged from his last visit A1c of 9.4%.    -He wears his CGM at all times, showing 60% above target, 36% within target, 4% hypoglycemia.       Glucose logs and insulin administration records pertaining to this visit,  to be scanned into patient's records.  Recent labs reviewed.  - I have re-counseled the patient on diet management and weight loss  by adopting a carbohydrate restricted / protein rich  Diet.  -  Suggestion is made for him to avoid simple carbohydrates  from his diet including Cakes, Sweet Desserts / Pastries, Ice Cream, Soda (diet and regular), Sweet Tea, Candies, Chips, Cookies, Store Bought Juices, Alcohol in Excess of  1-2 drinks a day, Artificial Sweeteners, and "Sugar-free" Products. This will help patient to have stable blood glucose profile and potentially avoid unintended weight gain.   - Patient is advised to stick to a routine mealtimes to eat 3 meals  a day and avoid unnecessary snacks ( to snack only to correct hypoglycemia).   - I have approached patient with the following individualized plan to manage diabetes and patient agrees.  -He did relatively better on basal/bolus insulin regimen.  -He is advised to increase Tresiba to 92 units nightly, continue  Humalog  22-28 units 3 times a day before breakfast, lunch, and supper for pre-meal blood glucose  above 90 mg/dL.  - He is currently using CGM, advised to continue document blood glucose at least 4 times a day-before meals and at bedtime.   -Patient is encouraged to call clinic for blood glucose levels less  than 70 or above 200 mg /dl x 3. -For the last 3 visits, his renal function has been stable.  He will continue to benefit from metformin therapy.  He is advised to continue metformin 500 mg p.o. twice daily-daily after breakfast and supper.     - Patient specific target  for A1c; LDL, HDL, Triglycerides, and  Waist Circumference were discussed in detail.  2) BP/HTN: His blood pressure is not controlled to target.  He is advised to continue  his current blood pressure medications including amlodipine, metoprolol.  His urine microalbumin is 4.6, within normal limits.  He will be considered for low-dose lisinopril if he has microalbuminuria.   3) Lipids/HPL: His recent labs show LDL controlled at 80.  He is advised to continue pravastatin 20 mg p.o. nightly.    4)  Weight/Diet: CDE consult in progress, exercise, and carbohydrates information provided.  5) hypothyroidism -His recent thyroid function tests are consistent with appropriate replacement.  He is advised to continue levothyroxine 137 mcg p.o. every morning.    -  We discussed about correct intake of levothyroxine, at fasting, with water, separated by at least 30 minutes from breakfast, and separated by more than 4 hours from calcium, iron, multivitamins, acid reflux medications (PPIs). -Patient is made aware of the fact that thyroid hormone replacement is needed for life, dose to be adjusted by periodic monitoring of thyroid function tests.  6) Chronic Care/Health Maintenance:  -Patient is on ACEI/ARB and Statin medications and encouraged to continue to follow up with Ophthalmology, Podiatrist at least yearly or according to recommendations, and advised to  stay away from smoking. I have recommended yearly flu vaccine and pneumonia vaccination at least every 5 years; moderate intensity exercise for up to 150 minutes weekly; and  sleep for at least 7 hours a day.  - I advised patient to maintain close follow up with [name], MD for  primary care needs.  - Time spent with the patient: 25 min, of which >50% was spent in reviewing his blood glucose logs , discussing his hypo- and hyper-glycemic episodes, reviewing his current and  previous labs and insulin doses and developing a plan to avoid hypo- and hyper-glycemia. Please refer to Patient Instructions for Blood Glucose Monitoring and Insulin/Medications Dosing Guide"  in media tab for additional information. Paul Baker participated in the discussions, expressed understanding, and voiced agreement with the above plans.  All questions were answered to his satisfaction. he is encouraged to contact clinic should he have any questions or concerns prior to his return visit.   Follow up plan: -Return in about 4 months (around 05/16/2018) for Meter, and Logs.  Glade Lloyd, MD Phone: [phone]  Fax: [phone]  This note was partially dictated with voice recognition software. Similar sounding words can be transcribed inadequately or may not  be corrected upon review.  01/15/2018, 10:04 AM

## 2018-01-15 NOTE — Patient Instructions (Signed)

## 2018-01-16 ENCOUNTER — Telehealth: Payer: Self-pay | Admitting: *Deleted

## 2018-01-16 NOTE — Telephone Encounter (Signed)
No answer, no voicemail.

## 2018-01-16 NOTE — Telephone Encounter (Signed)
Pt contacted pre-catheterization scheduled at [facility] for: Thursday January 17, 2018 9 AM Verified arrival time and place: [place] Entrance A at: 7 AM  No solid food after midnight prior to cath, clear liquids until 5 AM day of procedure. Contrast allergy: no  Hold: Metformin-day of procedure and 48 hours post procedure. Insulin-AM of procedure. Tresiba-1/2 usual dose PM prior to procedure. Furosemide-AM of procedure.  Except hold medications AM meds can be  taken pre-cath with sip of water including: ASA 81 mg  Confirm patient has responsible person to drive home post procedure and for 24 hours after you arrive home.  No answer-no voice mail.

## 2018-01-17 ENCOUNTER — Other Ambulatory Visit: Payer: Self-pay

## 2018-01-17 ENCOUNTER — Ambulatory Visit (HOSPITAL_COMMUNITY)
Admission: RE | Admit: 2018-01-17 | Discharge: 2018-01-17 | Disposition: A | Discharge status: Home / Self Care | Payer: Medicare HMO | Attending: Interventional Cardiology | Admitting: Interventional Cardiology

## 2018-01-17 ENCOUNTER — Encounter (HOSPITAL_COMMUNITY)
Admission: RE | Disposition: A | Discharge status: Home / Self Care | Payer: Self-pay | Source: Home / Self Care | Attending: Interventional Cardiology

## 2018-01-17 ENCOUNTER — Encounter (HOSPITAL_COMMUNITY): Payer: Self-pay | Admitting: Interventional Cardiology

## 2018-01-17 DIAGNOSIS — Z888 Allergy status to other drugs, medicaments and biological substances status: Secondary | ICD-10-CM | POA: Diagnosis not present

## 2018-01-17 DIAGNOSIS — G4733 Obstructive sleep apnea (adult) (pediatric): Secondary | ICD-10-CM

## 2018-01-17 DIAGNOSIS — J45909 Unspecified asthma, uncomplicated: Secondary | ICD-10-CM | POA: Diagnosis not present

## 2018-01-17 DIAGNOSIS — Z885 Allergy status to narcotic agent status: Secondary | ICD-10-CM | POA: Insufficient documentation

## 2018-01-17 DIAGNOSIS — I209 Angina pectoris, unspecified: Secondary | ICD-10-CM | POA: Diagnosis present

## 2018-01-17 DIAGNOSIS — Z86711 Personal history of pulmonary embolism: Secondary | ICD-10-CM | POA: Insufficient documentation

## 2018-01-17 DIAGNOSIS — I25119 Atherosclerotic heart disease of native coronary artery with unspecified angina pectoris: Secondary | ICD-10-CM | POA: Diagnosis not present

## 2018-01-17 DIAGNOSIS — Z8249 Family history of ischemic heart disease and other diseases of the circulatory system: Secondary | ICD-10-CM | POA: Insufficient documentation

## 2018-01-17 DIAGNOSIS — G47419 Narcolepsy without cataplexy: Secondary | ICD-10-CM | POA: Insufficient documentation

## 2018-01-17 DIAGNOSIS — I10 Essential (primary) hypertension: Secondary | ICD-10-CM | POA: Diagnosis present

## 2018-01-17 DIAGNOSIS — E119 Type 2 diabetes mellitus without complications: Secondary | ICD-10-CM | POA: Insufficient documentation

## 2018-01-17 DIAGNOSIS — Z6836 Body mass index (BMI) 36.0-36.9, adult: Secondary | ICD-10-CM | POA: Diagnosis not present

## 2018-01-17 DIAGNOSIS — Z794 Long term (current) use of insulin: Secondary | ICD-10-CM | POA: Diagnosis not present

## 2018-01-17 DIAGNOSIS — Z87891 Personal history of nicotine dependence: Secondary | ICD-10-CM | POA: Diagnosis not present

## 2018-01-17 DIAGNOSIS — Z79899 Other long term (current) drug therapy: Secondary | ICD-10-CM | POA: Insufficient documentation

## 2018-01-17 DIAGNOSIS — E1140 Type 2 diabetes mellitus with diabetic neuropathy, unspecified: Secondary | ICD-10-CM | POA: Diagnosis present

## 2018-01-17 DIAGNOSIS — E782 Mixed hyperlipidemia: Secondary | ICD-10-CM | POA: Insufficient documentation

## 2018-01-17 DIAGNOSIS — Z79890 Hormone replacement therapy (postmenopausal): Secondary | ICD-10-CM | POA: Diagnosis not present

## 2018-01-17 DIAGNOSIS — I200 Unstable angina: Secondary | ICD-10-CM

## 2018-01-17 DIAGNOSIS — Z9989 Dependence on other enabling machines and devices: Secondary | ICD-10-CM

## 2018-01-17 DIAGNOSIS — Z833 Family history of diabetes mellitus: Secondary | ICD-10-CM | POA: Diagnosis not present

## 2018-01-17 DIAGNOSIS — E1169 Type 2 diabetes mellitus with other specified complication: Secondary | ICD-10-CM | POA: Diagnosis present

## 2018-01-17 DIAGNOSIS — G2581 Restless legs syndrome: Secondary | ICD-10-CM | POA: Diagnosis not present

## 2018-01-17 DIAGNOSIS — Z86718 Personal history of other venous thrombosis and embolism: Secondary | ICD-10-CM | POA: Insufficient documentation

## 2018-01-17 DIAGNOSIS — E039 Hypothyroidism, unspecified: Secondary | ICD-10-CM | POA: Insufficient documentation

## 2018-01-17 DIAGNOSIS — M009 Pyogenic arthritis, unspecified: Secondary | ICD-10-CM | POA: Insufficient documentation

## 2018-01-17 DIAGNOSIS — I2510 Atherosclerotic heart disease of native coronary artery without angina pectoris: Secondary | ICD-10-CM | POA: Diagnosis present

## 2018-01-17 HISTORY — PX: LEFT HEART CATH AND CORONARY ANGIOGRAPHY: CATH118249

## 2018-01-17 LAB — POCT I-STAT 3, ART BLOOD GAS (G3+)
Bicarbonate: 25.5 mmol/L (ref 20.0–28.0)
O2 Saturation: 98 %
TCO2: 27 mmol/L (ref 22–32)
pCO2 arterial: 41.6 mmHg (ref 32.0–48.0)
pH, Arterial: 7.395 (ref 7.350–7.450)
pO2, Arterial: 109 mmHg — ABNORMAL HIGH (ref 83.0–108.0)

## 2018-01-17 LAB — GLUCOSE, CAPILLARY
Glucose-Capillary: 109 mg/dL — ABNORMAL HIGH (ref 70–99)
Glucose-Capillary: 61 mg/dL — ABNORMAL LOW (ref 70–99)
Glucose-Capillary: 89 mg/dL (ref 70–99)

## 2018-01-17 SURGERY — LEFT HEART CATH AND CORONARY ANGIOGRAPHY
Anesthesia: LOCAL

## 2018-01-17 MED ORDER — ASPIRIN 81 MG PO CHEW
81.0000 mg | CHEWABLE_TABLET | ORAL | Status: AC
  Administered 2018-01-17: 81 mg via ORAL
  Filled 2018-01-17: qty 1

## 2018-01-17 MED ORDER — HEPARIN (PORCINE) IN NACL 1000-0.9 UT/500ML-% IV SOLN
INTRAVENOUS | Status: DC | PRN
  Administered 2018-01-17 (×2): 500 mL

## 2018-01-17 MED ORDER — VERAPAMIL HCL 2.5 MG/ML IV SOLN
INTRAVENOUS | Status: DC | PRN
  Administered 2018-01-17: 10 mL via INTRA_ARTERIAL

## 2018-01-17 MED ORDER — LIDOCAINE HCL (PF) 1 % IJ SOLN
INTRAMUSCULAR | Status: AC
  Filled 2018-01-17: qty 30

## 2018-01-17 MED ORDER — VERAPAMIL HCL 2.5 MG/ML IV SOLN
INTRAVENOUS | Status: AC
  Filled 2018-01-17: qty 2

## 2018-01-17 MED ORDER — SODIUM CHLORIDE 0.9 % IV SOLN: 250.0000 mL | INTRAVENOUS | Status: DC | PRN

## 2018-01-17 MED ORDER — HEPARIN SODIUM (PORCINE) 1000 UNIT/ML IJ SOLN
INTRAMUSCULAR | Status: AC
  Filled 2018-01-17: qty 1

## 2018-01-17 MED ORDER — MIDAZOLAM HCL 2 MG/2ML IJ SOLN
INTRAMUSCULAR | Status: AC
  Filled 2018-01-17: qty 2

## 2018-01-17 MED ORDER — HEPARIN (PORCINE) IN NACL 1000-0.9 UT/500ML-% IV SOLN
INTRAVENOUS | Status: AC
  Filled 2018-01-17: qty 1000

## 2018-01-17 MED ORDER — SODIUM CHLORIDE 0.9 % IV SOLN: INTRAVENOUS | Status: DC

## 2018-01-17 MED ORDER — SODIUM CHLORIDE 0.9% FLUSH: 3.0000 mL | Freq: Two times a day (BID) | INTRAVENOUS | Status: DC

## 2018-01-17 MED ORDER — IOHEXOL 350 MG/ML SOLN
INTRAVENOUS | Status: DC | PRN
  Administered 2018-01-17: 80 mL via INTRACARDIAC

## 2018-01-17 MED ORDER — FENTANYL CITRATE (PF) 100 MCG/2ML IJ SOLN
INTRAMUSCULAR | Status: AC
  Filled 2018-01-17: qty 2

## 2018-01-17 MED ORDER — FENTANYL CITRATE (PF) 100 MCG/2ML IJ SOLN
INTRAMUSCULAR | Status: DC | PRN
  Administered 2018-01-17: 50 ug via INTRAVENOUS
  Administered 2018-01-17: 25 ug via INTRAVENOUS

## 2018-01-17 MED ORDER — LIDOCAINE HCL (PF) 1 % IJ SOLN
INTRAMUSCULAR | Status: DC | PRN
  Administered 2018-01-17 (×2): 2 mL via INTRADERMAL

## 2018-01-17 MED ORDER — SODIUM CHLORIDE 0.9% FLUSH: 3.0000 mL | INTRAVENOUS | Status: DC | PRN

## 2018-01-17 MED ORDER — ACETAMINOPHEN 325 MG PO TABS: 650.0000 mg | ORAL_TABLET | ORAL | Status: DC | PRN

## 2018-01-17 MED ORDER — HEPARIN SODIUM (PORCINE) 1000 UNIT/ML IJ SOLN
INTRAMUSCULAR | Status: DC | PRN
  Administered 2018-01-17: 5000 [IU] via INTRAVENOUS

## 2018-01-17 MED ORDER — SODIUM CHLORIDE 0.9 % WEIGHT BASED INFUSION: 1.0000 mL/kg/h | INTRAVENOUS | Status: DC

## 2018-01-17 MED ORDER — ONDANSETRON HCL 4 MG/2ML IJ SOLN: 4.0000 mg | Freq: Four times a day (QID) | INTRAMUSCULAR | Status: DC | PRN

## 2018-01-17 MED ORDER — SODIUM CHLORIDE 0.9 % WEIGHT BASED INFUSION
3.0000 mL/kg/h | INTRAVENOUS | Status: AC
  Administered 2018-01-17: 3 mL/kg/h via INTRAVENOUS

## 2018-01-17 MED ORDER — MIDAZOLAM HCL 2 MG/2ML IJ SOLN
INTRAMUSCULAR | Status: DC | PRN
  Administered 2018-01-17: 0.5 mg via INTRAVENOUS
  Administered 2018-01-17: 1 mg via INTRAVENOUS

## 2018-01-17 SURGICAL SUPPLY — 16 items
CATH BALLN WEDGE 5F 110CM (CATHETERS) ×1 IMPLANT
CATH INFINITI 5 FR JL3.5 (CATHETERS) ×1 IMPLANT
CATH INFINITI JR4 5F (CATHETERS) ×1 IMPLANT
DEVICE RAD COMP TR BAND LRG (VASCULAR PRODUCTS) ×1 IMPLANT
GLIDESHEATH SLEND A-KIT 6F 22G (SHEATH) ×1 IMPLANT
GUIDEWIRE .025 260CM (WIRE) ×1 IMPLANT
GUIDEWIRE INQWIRE 1.5J.035X260 (WIRE) IMPLANT
INQWIRE 1.5J .035X260CM (WIRE) ×2
KIT ESSENTIALS PG (KITS) ×1 IMPLANT
KIT HEART LEFT (KITS) ×2 IMPLANT
PACK CARDIAC CATHETERIZATION (CUSTOM PROCEDURE TRAY) ×2 IMPLANT
SHEATH GLIDE SLENDER 4/5FR (SHEATH) ×1 IMPLANT
SHEATH PROBE COVER 6X72 (BAG) ×1 IMPLANT
TRANSDUCER W/STOPCOCK (MISCELLANEOUS) ×2 IMPLANT
TUBING CIL FLEX 10 FLL-RA (TUBING) ×2 IMPLANT
WIRE EMERALD 3MM-J .025X260CM (WIRE) ×1 IMPLANT

## 2018-01-17 NOTE — Interval H&P Note (Signed)
Cath Lab Visit (complete for each Cath Lab visit)  Clinical Evaluation Leading to the Procedure:   ACS: No.  Non-ACS:    Anginal Classification: CCS III  Anti-ischemic medical therapy: Maximal Therapy (2 or more classes of medications)  Non-Invasive Test Results: No non-invasive testing performed  Prior CABG: No previous CABG      History and Physical Interval Note:  01/17/2018 8:35 AM  Paul Baker  has presented today for surgery, with the diagnosis of angina  The various methods of treatment have been discussed with the patient and family. After consideration of risks, benefits and other options for treatment, the patient has consented to  Procedure(s): RIGHT/LEFT HEART CATH AND CORONARY ANGIOGRAPHY (N/A) as a surgical intervention .  The patient's history has been reviewed, patient examined, no change in status, stable for surgery.  I have reviewed the patient's chart and labs.  Questions were answered to the patient's satisfaction.     [name]

## 2018-01-17 NOTE — Discharge Instructions (Signed)
Radial Site Care ° °This sheet gives you information about how to care for yourself after your procedure. Your health care provider may also give you more specific instructions. If you have problems or questions, contact your health care provider. °What can I expect after the procedure? °After the procedure, it is common to have: °· Bruising and tenderness at the catheter insertion area. °Follow these instructions at home: °Medicines °· Take over-the-counter and prescription medicines only as told by your health care provider. °Insertion site care °· Follow instructions from your health care provider about how to take care of your insertion site. Make sure you: °? Wash your hands with soap and water before you change your bandage (dressing). If soap and water are not available, use hand sanitizer. °? Change your dressing as told by your health care provider. °? Leave stitches (sutures), skin glue, or adhesive strips in place. These skin closures may need to stay in place for 2 weeks or longer. If adhesive strip edges start to loosen and curl up, you may trim the loose edges. Do not remove adhesive strips completely unless your health care provider tells you to do that. °· Check your insertion site every day for signs of infection. Check for: °? Redness, swelling, or pain. °? Fluid or blood. °? Pus or a bad smell. °? Warmth. °· Do not take baths, swim, or use a hot tub until your health care provider approves. °· You may shower 24-48 hours after the procedure, or as directed by your health care provider. °? Remove the dressing and gently wash the site with plain soap and water. °? Pat the area dry with a clean towel. °? Do not rub the site. That could cause bleeding. °· Do not apply powder or lotion to the site. °Activity ° °· For 24 hours after the procedure, or as directed by your health care provider: °? Do not flex or bend the affected arm. °? Do not push or pull heavy objects with the affected arm. °? Do not  drive yourself home from the hospital or clinic. You may drive 24 hours after the procedure unless your health care provider tells you not to. °? Do not operate machinery or power tools. °· Do not lift anything that is heavier than 10 lb (4.5 kg), or the limit that you are told, until your health care provider says that it is safe. °· Ask your health care provider when it is okay to: °? Return to work or school. °? Resume usual physical activities or sports. °? Resume sexual activity. °General instructions °· If the catheter site starts to bleed, raise your arm and put firm pressure on the site. If the bleeding does not stop, get help right away. This is a medical emergency. °· If you went home on the same day as your procedure, a responsible adult should be with you for the first 24 hours after you arrive home. °· Keep all follow-up visits as told by your health care provider. This is important. °Contact a health care provider if: °· You have a fever. °· You have redness, swelling, or yellow drainage around your insertion site. °Get help right away if: °· You have unusual pain at the radial site. °· The catheter insertion area swells very fast. °· The insertion area is bleeding, and the bleeding does not stop when you hold steady pressure on the area. °· Your arm or hand becomes pale, cool, tingly, or numb. °These symptoms may represent a serious problem   that is an emergency. Do not wait to see if the symptoms will go away. Get medical help right away. Call your local emergency services (911 in the [place] Do not drive yourself to the hospital. °Summary °· After the procedure, it is common to have bruising and tenderness at the site. °· Follow instructions from your health care provider about how to take care of your radial site wound. Check the wound every day for signs of infection. °· Do not lift anything that is heavier than 10 lb (4.5 kg), or the limit that you are told, until your health care provider says  that it is safe. °This information is not intended to replace advice given to you by your health care provider. Make sure you discuss any questions you have with your health care provider. °Document Released: 01/21/2010 Document Revised: 01/24/2017 Document Reviewed: 01/24/2017 °Elsevier Interactive Patient Education © 2019 [employer] Inc. ° °

## 2018-01-17 NOTE — CV Procedure (Signed)
   Right and left heart cath attempted from right arm.  Right antecubital vein allowed access into the subclavian vein.  Angiography demonstrated that there was severe stenosis at the junction of the subclavian with the superior vena cava possibly involving the vena cava.  Further attempts a right heart cath from the arm were aborted.  Left heart cath was performed via right radial with vascular ultrasound guidance for access.  Heparin was administered.  Nonobstructive coronary disease is identified with 50 to 70% nondominant right coronary, 50% mid LAD after the first septal perforator, tiny second diagonal with 70% ostial narrowing, large circumflex supplying the PDA with the first obtuse marginal containing 50% narrowing.  No immediate complications.

## 2018-01-18 MED FILL — Lidocaine HCl Local Preservative Free (PF) Inj 1%: INTRAMUSCULAR | Qty: 30 | Status: AC

## 2018-01-28 DIAGNOSIS — G4733 Obstructive sleep apnea (adult) (pediatric): Secondary | ICD-10-CM | POA: Diagnosis not present

## 2018-02-06 ENCOUNTER — Other Ambulatory Visit: Payer: Self-pay | Admitting: Adult Health

## 2018-02-06 MED ORDER — MODAFINIL 200 MG PO TABS: 200.0000 mg | ORAL_TABLET | Freq: Every day | ORAL | 0 refills | Status: DC

## 2018-02-11 ENCOUNTER — Telehealth: Payer: Self-pay | Admitting: *Deleted

## 2018-02-11 DIAGNOSIS — Z6836 Body mass index (BMI) 36.0-36.9, adult: Secondary | ICD-10-CM | POA: Diagnosis not present

## 2018-02-11 DIAGNOSIS — R42 Dizziness and giddiness: Secondary | ICD-10-CM | POA: Diagnosis not present

## 2018-02-11 DIAGNOSIS — N182 Chronic kidney disease, stage 2 (mild): Secondary | ICD-10-CM | POA: Diagnosis not present

## 2018-02-11 DIAGNOSIS — E1122 Type 2 diabetes mellitus with diabetic chronic kidney disease: Secondary | ICD-10-CM | POA: Diagnosis not present

## 2018-02-11 DIAGNOSIS — I129 Hypertensive chronic kidney disease with stage 1 through stage 4 chronic kidney disease, or unspecified chronic kidney disease: Secondary | ICD-10-CM | POA: Diagnosis not present

## 2018-02-11 NOTE — Telephone Encounter (Addendum)
Received approval for Modafinil.  21/30/19 thru 21/31/20.  ID [id number].  Ref # [id number].  [employer] MCR Part D [id number].  Fax confirmation to [facility] [phone].

## 2018-02-11 NOTE — Telephone Encounter (Signed)
Initiated CMM for PA on Modafinil 200mg  tabs.  (is on formulary), needs PA.  KEY [id number].

## 2018-02-14 ENCOUNTER — Ambulatory Visit (INDEPENDENT_AMBULATORY_CARE_PROVIDER_SITE_OTHER): Payer: Medicare HMO | Admitting: Student

## 2018-02-14 ENCOUNTER — Encounter: Payer: Self-pay | Admitting: Student

## 2018-02-14 VITALS — BP 112/68 | HR 67 | Ht 65.0 in | Wt 220.0 lb

## 2018-02-14 DIAGNOSIS — I25119 Atherosclerotic heart disease of native coronary artery with unspecified angina pectoris: Secondary | ICD-10-CM | POA: Diagnosis not present

## 2018-02-14 DIAGNOSIS — G4733 Obstructive sleep apnea (adult) (pediatric): Secondary | ICD-10-CM | POA: Diagnosis not present

## 2018-02-14 DIAGNOSIS — E785 Hyperlipidemia, unspecified: Secondary | ICD-10-CM

## 2018-02-14 DIAGNOSIS — I10 Essential (primary) hypertension: Secondary | ICD-10-CM | POA: Diagnosis not present

## 2018-02-14 DIAGNOSIS — R0609 Other forms of dyspnea: Secondary | ICD-10-CM | POA: Diagnosis not present

## 2018-02-14 MED ORDER — ISOSORBIDE MONONITRATE ER 60 MG PO TB24: ORAL_TABLET | ORAL | 3 refills | Status: DC

## 2018-02-14 MED ORDER — ASPIRIN EC 81 MG PO TBEC: 81.0000 mg | DELAYED_RELEASE_TABLET | Freq: Every day | ORAL | 3 refills | Status: DC

## 2018-02-14 NOTE — Progress Notes (Signed)
Cardiology Office Note    Date:  02/14/2018   ID:  Paul Baker, DOB 1950/03/30, MRN [id number]  PCP:  [name], MD  Cardiologist: [name], MD    Chief Complaint  Patient presents with  . Follow-up    s/p cardiac catheterization    History of Present Illness:    Paul Baker is a 68 y.o. male with past medical history of CAD (90% stenosis along small non-dominant RCA in 2011),  HTN, HLD, Type II DM, and OSA (on CPAP) who presents to the office for follow-up from his recent cardiac catheterization.  He was recently examined by Dr. Domenic Polite in 01/2018 and reported worsening dyspnea on exertion over the past several months which improved with sublingual nitroglycerin. Prior cardiac catheterization in 2011 had demonstrated a 90% stenosis along a nondominant RCA and medical management was recommended. Given his progressive symptoms and known CAD, a repeat R/LHC was recommended for definitive evaluation.This was performed by Dr. Tamala Julian on 01/17/2018 and showed nonobstructive CAD with 60 to 70% proximal to mid nondominant RCA stenosis, 50% mid LAD and 40 to 50% OM1. EF was preserved at 60% but a RHC was unable to be performed due to distal subclavian stenosis and an echocardiogram was recommended.   In talking with the patient and his wife today, he reports still having intermittent dyspnea on exertion which was occurring prior to his cardiac catheterization and has remained unchanged. This happens intermittently as he reports being able to walk on the treadmill at [employer] for 20 to 30 minutes and sometimes does not experience symptoms yet at other times he gets short of breath when walking around his home. Says that his episodes of chest discomfort have lessened and he has not had to utilize SL NTG as regularly.  Symptoms are typically worse in the morning hours and improve throughout the day.  He denies any recent orthopnea, PND, or lower extremity edema. Says  that weight has recently declined on his home scales as him and his wife are following a more regimented diet.   Past Medical History:  Diagnosis Date  . Arthritis   . Asthma   . Colon polyp   . Coronary atherosclerosis of native coronary artery    a. 2011: cath showing 90% stenosis along small non-dominant RCA (too small for PCI). b. 01/2018: cath showing nonobstructive CAD with 60 to 70% proximal to mid nondominant RCA stenosis, 50% mid LAD and 40 to 50% OM1  . DVT (deep venous thrombosis) ([place]) 2005   Right arm  . Essential hypertension, benign   . Headache   . History of transfusion   . Hypersomnia    CPAP of 16 cm, diagnosed with AHI of 60 in 2012,[place] 21- narcolepsy?  [name] Hypothyroidism   . Mixed hyperlipidemia   . Morbid obesity ([place])   . MRSA (methicillin resistant staph aureus) culture positive    08/2012  . Narcolepsy   . OSA (obstructive sleep apnea)    CPAP  . Pneumonia   . Psoriasis   . Pulmonary embolism ([place]) 2004  . RLS (restless legs syndrome)   . Rotator cuff disorder    Left  . Septic arthritis of knee, left ([place])   . Skin cancer, basal cell   . Type 2 diabetes mellitus ([place])     Past Surgical History:  Procedure Laterality Date  . BACK SURGERY    . CATARACT EXTRACTION Bilateral   . COLONOSCOPY    . CYST REMOVAL  TRUNK     from back  . EYE SURGERY Left 2016   laser to left eye  . HIP SURGERY     bone removed from both sides of hip  . KNEE ARTHROTOMY Right 12/04/2014   Procedure: KNEE ARTHROTOMY PATELLA LIGAMENT RECONSTRUSION AND REPAIR RIGHT KNEE;  Surgeon: Paralee Cancel, MD;  Location: [facility];  Service: Orthopedics;  Laterality: Right;  . KNEE SURGERY     X 25 TIMES  . LEFT HEART CATH AND CORONARY ANGIOGRAPHY N/A 01/17/2018   Procedure: LEFT HEART CATH AND CORONARY ANGIOGRAPHY;  Surgeon: Belva Crome, MD;  Location: [facility];  Service: Cardiovascular;  Laterality: N/A;  . LUMBAR DISC SURGERY     Left L3, L4, L5 discecotomy with  decompression of L4 root  . TONSILLECTOMY    . TOTAL KNEE ARTHROPLASTY  2003   LEFT  . TOTAL KNEE ARTHROPLASTY Right 03/23/2014   Procedure: RIGHT TOTAL KNEE ARTHROPLASTY AND REMOVAL RIGHT TIBIAL  DEEP IMPLANT STAPLE;  Surgeon: Paralee Cancel, MD;  Location: [facility];  Service: Orthopedics;  Laterality: Right;  . TOTAL KNEE REVISION  2005   LEFT  . WRIST SURGERY      Current Medications: Outpatient Medications Prior to Visit  Medication Sig Dispense Refill  . albuterol (PROVENTIL HFA;VENTOLIN HFA) 108 (90 Base) MCG/ACT inhaler Inhale 2 puffs into the lungs every 6 (six) hours as needed for wheezing or shortness of breath. 1 Inhaler 0  . amLODipine (NORVASC) 5 MG tablet Take 2.5 mg by mouth daily.    . B-D ULTRAFINE III SHORT PEN 31G X 8 MM MISC USING FIVE TIMES DAILY. 150 each 5  . benzonatate (TESSALON) 100 MG capsule Take 1 capsule (100 mg total) by mouth 3 (three) times daily as needed for cough. 30 capsule 0  . dextromethorphan (DELSYM) 30 MG/5ML liquid Take 60 mg by mouth as needed for cough.    . fenofibrate 160 MG tablet TAKE 1 TABLET BY MOUTH AT BEDTIME - 90 tablet 3  . furosemide (LASIX) 40 MG tablet Take 40 mg by mouth. Take 40 mg alternating with 80 mg Daily    . insulin aspart (NOVOLOG) 100 UNIT/ML injection Inject into the skin 3 (three) times daily before meals.    . insulin degludec (TRESIBA FLEXTOUCH) 100 UNIT/ML SOPN FlexTouch Pen Inject 0.92 mLs (92 Units total) into the skin at bedtime. 10 pen 1  . Insulin Syringe-Needle U-100 (INSULIN SYRINGE 1CC/31GX5/16") 31G X 5/16" 1 ML MISC 1 each by Does not apply route 2 (two) times daily. 100 each 2  . levothyroxine (SYNTHROID, LEVOTHROID) 137 MCG tablet TAKE 1 TABLET BY MOUTH ONCE DAILY IN THE MORNING BEFORE BREAKFAST (Patient taking differently: Take 137 mcg by mouth daily before breakfast. ) 90 tablet 1  . loratadine (CLARITIN) 10 MG tablet Take 10 mg by mouth daily as needed for allergies.     [name] losartan (COZAAR) 25 MG tablet Take  25 mg by mouth daily.    . metFORMIN (GLUCOPHAGE) 500 MG tablet Take 1 tablet (500 mg total) by mouth 2 (two) times daily with a meal. 60 tablet 2  . metoprolol succinate (TOPROL-XL) 50 MG 24 hr tablet TAKE 1 TABLET BY MOUTH TWICE DAILY 180 tablet 1  . modafinil (PROVIGIL) 200 MG tablet Take 1 tablet (200 mg total) by mouth daily. 30 tablet 0  . nitroGLYCERIN (NITROSTAT) 0.4 MG SL tablet DISSOLVE 1 TABLET UNDER TONGUE EVERY 5 MINUTES UP TO 15 MIN FOR CHESTPAIN. IF NO RELIEF  CALL 911. 25 tablet 3  . ONE TOUCH ULTRA TEST test strip TEST BLOOD SUGAR UP TO 4 TIMES DAILY. 150 each 5  . ONETOUCH DELICA LANCETS 53G MISC USE AS DIRECTED TO TEST BLOOD SUGAR 4 TIMES DAILY. 150 each 5  . rOPINIRole (REQUIP XL) 4 MG 24 hr tablet Take 1 tablet (4 mg total) by mouth at bedtime. 30 tablet 5  . rOPINIRole (REQUIP) 4 MG tablet Take 0.5 tablets (2 mg total) by mouth 2 (two) times daily. 30 tablet 5  . sodium chloride (OCEAN) 0.65 % SOLN nasal spray Place 1-2 sprays into both nostrils daily as needed for congestion.    . Vitamin D, Ergocalciferol, (DRISDOL) 1.25 MG (50000 UT) CAPS capsule TAKE 1 CAPSULE BY MOUTH ONCE A WEEK 12 capsule 0  . isosorbide mononitrate (IMDUR) 60 MG 24 hr tablet TAKE (1/2) TABLET BY MOUTH TWICE DAILY. 90 tablet 3  . pravastatin (PRAVACHOL) 20 MG tablet Take 1 tablet (20 mg total) by mouth every evening. 90 tablet 3  . amLODipine (NORVASC) 5 MG tablet Take 1 tablet (5 mg total) by mouth daily. 90 tablet 3  . clonazePAM (KLONOPIN) 1 MG tablet TAKE (1) TABLET BY MOUTH AT BEDTIME FOR RESTLESS LEGS. (Patient taking differently: Take 1 mg by mouth at bedtime as needed (restless legs). ) 30 tablet 3  . furosemide (LASIX) 40 MG tablet Take 40 mg by mouth daily.     . insulin lispro (HUMALOG KWIKPEN) 100 UNIT/ML KiwkPen Inject 0.22-0.28 mLs (22-28 Units total) into the skin 3 (three) times daily. 5 pen 2   No facility-administered medications prior to visit.      Allergies:   Cardizem [diltiazem  hcl]; Cardura [doxazosin mesylate]; Lipitor [atorvastatin]; Paxil [paroxetine hcl]; Adhesive [tape]; Codeine; and Gabapentin   Social History   Socioeconomic History  . Marital status: Married    Spouse name: Toney Reil  . Number of children: 2  . Years of education: college  . Highest education level: Not on file  Occupational History  . Occupation: Disabled    Fish farm manager: UNEMPLOYED  Social Needs  . Financial resource strain: Not on file  . Food insecurity:    Worry: Not on file    Inability: Not on file  . Transportation needs:    Medical: Not on file    Non-medical: Not on file  Tobacco Use  . Smoking status: Former Smoker    Types: Cigarettes    Start date: 06/19/1967    Last attempt to quit: 01/03/1995    Years since quitting: 23.1  . Smokeless tobacco: Never Used  Substance and Sexual Activity  . Alcohol use: No    Alcohol/week: 0.0 standard drinks    Comment: quit drinking in 07/86  . Drug use: No  . Sexual activity: Yes    Partners: Female  Lifestyle  . Physical activity:    Days per week: Not on file    Minutes per session: Not on file  . Stress: Not on file  Relationships  . Social connections:    Talks on phone: Not on file    Gets together: Not on file    Attends religious service: Not on file    Active member of club or organization: Not on file    Attends meetings of clubs or organizations: Not on file    Relationship status: Not on file  Other Topics Concern  . Not on file  Social History Narrative    68 year old, right-handed, caucasian male with a past medical  history of obesity, hypertension, hyperlipidemia, diabetes, obstructive sleep apnea, presenting with frequent nighttime awakenings, excessive daytime sleepiness, also transient confusional episodes.RLS and one beosity, OSA on CPAP with AHI of 3.2 and  setting of 16 cm water , [facility] .     Family History:  The patient's family history includes Diabetes in his sister; Heart attack in his  father; Hypertension in his father; Kidney Stones in his father; Narcolepsy in his grandchild; Seizures in his grandchild.   Review of Systems:   Please see the history of present illness.     General:  No chills, fever, night sweats or weight changes.  Cardiovascular:  No chest pain, edema, orthopnea, palpitations, paroxysmal nocturnal dyspnea. Positive for dyspnea on exertion.  Dermatological: No rash, lesions/masses Respiratory: No cough, dyspnea Urologic: No hematuria, dysuria Abdominal:   No nausea, vomiting, diarrhea, bright red blood per rectum, melena, or hematemesis Neurologic:  No visual changes, wkns, changes in mental status. All other systems reviewed and are otherwise negative except as noted above.   Physical Exam:    VS:  BP 112/68   Pulse 67   Ht 5\' 5"  (1.651 m)   Wt 220 lb (99.8 kg)   SpO2 96%   BMI 36.61 kg/m    General: Well developed, well nourished Caucasian male appearing in no acute distress. Head: Normocephalic, atraumatic, sclera non-icteric, no xanthomas, nares are without discharge.  Neck: No carotid bruits. JVD not elevated.  Lungs: Respirations regular and unlabored, without wheezes or rales.  Heart: Regular rate and rhythm. No S3 or S4.  No murmur, no rubs, or gallops appreciated. Abdomen: Soft, non-tender, non-distended with normoactive bowel sounds. No hepatomegaly. No rebound/guarding. No obvious abdominal masses. Msk:  Strength and tone appear normal for age. No joint deformities or effusions. Extremities: No clubbing or cyanosis. No lower extremity edema.  Distal pedal pulses are 2+ bilaterally. Neuro: Alert and oriented X 3. Moves all extremities spontaneously. No focal deficits noted. Psych:  Responds to questions appropriately with a normal affect. Skin: No rashes or lesions noted  Wt Readings from Last 3 Encounters:  02/14/18 220 lb (99.8 kg)  01/17/18 216 lb (98 kg)  01/15/18 223 lb (101.2 kg)     Studies/Labs Reviewed:   EKG:  EKG  is not ordered today.   Recent Labs: 10/08/2017: TSH 0.99 01/14/2018: ALT 12; BUN 21; Creatinine, Ser 1.20; Hemoglobin 12.6; Platelets 282; Potassium 4.3; Sodium 135   Lipid Panel    Component Value Date/Time   CHOL 152 01/14/2018 0749   CHOL 148 08/19/2015 0914   TRIG 107 01/14/2018 0749   HDL 49 01/14/2018 0749   HDL 53 08/19/2015 0914   CHOLHDL 3.1 01/14/2018 0749   VLDL 31 11/13/2013 0843   LDLCALC 83 01/14/2018 0749    Additional studies/ records that were reviewed today include:   Cardiac Catheterization: 01/17/2018  Nonobstructive coronary artery disease with 60 to 70% proximal to mid nondominant right coronary, 50% mid LAD, and 40 to 50% first obtuse marginal narrowing.  Normal left ventricular systolic function with EF 60%.  LVEDP is 17 mmHg.  Failed right heart catheterization from right antecubital vein due to distal subclavian stenosis likely from prior PICC line.  Further attempts a right heart cath were aborted as IV heparin had been given for left heart catheterization.  RECOMMENDATIONS:   INOCA (ischemia with no obstructive coronary artery disease).  Aggressive risk factor modification including excellent lipid control.  2D Doppler echocardiogram to assess RV and estimate PA pressure.  Right heart cath from femoral approach her left arm could be attempted in the future if that information is absolutely needed.  He is not compliant with CPAP which may be the driver for his recent decrease in exertional tolerance and chest pain.  Assessment:    1. Atherosclerosis of native coronary artery of native heart with angina pectoris ([place])   2. Dyspnea on exertion   3. Essential hypertension   4. Hyperlipidemia LDL goal <70   5. OSA (obstructive sleep apnea)      Plan:   In order of problems listed above:  1. CAD/ Dyspnea on Exertion - recent cardiac catheterization showed nonobstructive CAD with 60 to 70% proximal to mid nondominant RCA stenosis(improved  from prior cath), 50% mid LAD and 40 to 50% OM1 stenosis. EF was preserved at 60% but a RHC was unable to be performed due to distal subclavian stenosis and an echocardiogram was recommended.  - Reviewed results in detail with the patient and his wife today. Will plan to obtain an echocardiogram as outlined above due to [place] not being able to be performed. He is still having intermittent dyspnea on exertion and appears euvolemic by examination today. Remains on Lasix 80 mg daily alternating with 40 mg daily which is followed by Nephrology. Given that his symptoms are typically worse in the morning, will plan to titrate Imdur 30 mg twice daily to 60mg  in AM and 30mg  in PM. Will also start ASA 81mg  daily in the setting of known CAD. Remains on BB and statin therapy.   2. HTN - BP is well controlled at 112/68 during today's visit. Continue Amlodipine 2.5 mg daily, Losartan 25 mg daily, and Toprol-XL 50 mg daily. Will plan to titrate Imdur as outlined above.  3. HLD - FLP in 01/2018 showed total cholesterol 152, HDL 49, triglycerides 107, and LDL 83. Goal LDL is less than 70 in the setting of known CAD. He has been intolerant to high intensity statin therapy in the past and remains on Pravastatin 20 mg daily.  4. OSA - continued compliance with CPAP encouraged.  Medication Adjustments/Labs and Tests Ordered: Current medicines are reviewed at length with the patient today.  Concerns regarding medicines are outlined above.  Medication changes, Labs and Tests ordered today are listed in the Patient Instructions below. Patient Instructions  Medication Instructions:  Your physician has recommended you make the following change in your medication:  Increase Imdur to 60 mg in the AM and 30 mg in the PM   If you need a refill on your cardiac medications before your next appointment, please call your pharmacy.   Lab work: NONE  If you have labs (blood work) drawn today and your tests are completely normal,  you will receive your results only by: [name] MyChart Message (if you have MyChart) OR . A paper copy in the mail If you have any lab test that is abnormal or we need to change your treatment, we will call you to review the results.  Testing/Procedures: Your physician has requested that you have an echocardiogram. Echocardiography is a painless test that uses sound waves to create images of your heart. It provides your doctor with information about the size and shape of your heart and how well your heart's chambers and valves are working. This procedure takes approximately one hour. There are no restrictions for this procedure.  Follow-Up: At [facility], you and your health needs are our priority.  As part of our continuing mission to  provide you with exceptional heart care, we have created designated Provider Care Teams.  These Care Teams include your primary Cardiologist (physician) and Advanced Practice Providers (APPs -  Physician Assistants and Nurse Practitioners) who all work together to provide you with the care you need, when you need it. You will need a follow up appointment in 3 months.  Please call our office 2 months in advance to schedule this appointment.  You may see [name], MD or one of the following Advanced Practice Providers on your designated Care Team:   Bernerd Pho, PA-C [facility]) . Ermalinda Barrios, PA-C ([place])  Any Other Special Instructions Will Be Listed Below (If Applicable). Thank you for choosing [facility]!    Signed, Erma Heritage, PA-C  02/14/2018 4:27 PM    [facility] [place], [place] [address] Phone: [phone] Fax: [phone]

## 2018-02-14 NOTE — Patient Instructions (Signed)
Medication Instructions:  Your physician has recommended you make the following change in your medication:  Increase Imdur to 60 mg in the AM and 30 mg in the PM   If you need a refill on your cardiac medications before your next appointment, please call your pharmacy.   Lab work: NONE  If you have labs (blood work) drawn today and your tests are completely normal, you will receive your results only by: [name] MyChart Message (if you have MyChart) OR . A paper copy in the mail If you have any lab test that is abnormal or we need to change your treatment, we will call you to review the results.  Testing/Procedures: Your physician has requested that you have an echocardiogram. Echocardiography is a painless test that uses sound waves to create images of your heart. It provides your doctor with information about the size and shape of your heart and how well your heart's chambers and valves are working. This procedure takes approximately one hour. There are no restrictions for this procedure.    Follow-Up: At [facility], you and your health needs are our priority.  As part of our continuing mission to provide you with exceptional heart care, we have created designated Provider Care Teams.  These Care Teams include your primary Cardiologist (physician) and Advanced Practice Providers (APPs -  Physician Assistants and Nurse Practitioners) who all work together to provide you with the care you need, when you need it. You will need a follow up appointment in 3 months.  Please call our office 2 months in advance to schedule this appointment.  You may see [name], MD or one of the following Advanced Practice Providers on your designated Care Team:   Bernerd Pho, PA-C [facility]) . Ermalinda Barrios, PA-C ([facility])  Any Other Special Instructions Will Be Listed Below (If Applicable). Thank you for choosing [facility]!

## 2018-02-17 ENCOUNTER — Other Ambulatory Visit: Payer: Self-pay | Admitting: "Endocrinology

## 2018-02-20 ENCOUNTER — Other Ambulatory Visit: Payer: Self-pay

## 2018-02-20 MED ORDER — FREESTYLE LIBRE 14 DAY SENSOR MISC: 1.0000 | 5 refills | Status: DC

## 2018-02-21 ENCOUNTER — Telehealth: Payer: Self-pay | Admitting: *Deleted

## 2018-02-21 ENCOUNTER — Ambulatory Visit (HOSPITAL_COMMUNITY)
Admission: RE | Admit: 2018-02-21 | Discharge: 2018-02-21 | Disposition: A | Discharge status: Home / Self Care | Payer: Medicare HMO | Source: Ambulatory Visit | Attending: Student | Admitting: Student

## 2018-02-21 DIAGNOSIS — R0609 Other forms of dyspnea: Secondary | ICD-10-CM | POA: Diagnosis not present

## 2018-02-21 NOTE — Telephone Encounter (Signed)
-----   Message from Erma Heritage, [place] sent at 02/21/2018  4:23 PM EST ----- Please let the patient know that his echocardiogram showed normal pumping function of the heart with a preserved EF of 55 to 60%.  Pumping function of the right ventricle was normal.  No significant valve abnormalities. No significant abnormalities overall. Please forward a copy of results to [name], MD. Thank you.

## 2018-02-21 NOTE — Telephone Encounter (Signed)
Called patient with test results. No answer. Left message to call back.  

## 2018-02-21 NOTE — Progress Notes (Signed)
*  PRELIMINARY RESULTS* Echocardiogram 2D Echocardiogram has been performed.  Paul Baker 02/21/2018, 9:11 AM

## 2018-03-06 ENCOUNTER — Other Ambulatory Visit: Payer: Self-pay | Admitting: "Endocrinology

## 2018-03-15 ENCOUNTER — Other Ambulatory Visit: Payer: Self-pay | Admitting: Adult Health

## 2018-03-18 MED ORDER — MODAFINIL 200 MG PO TABS: 200.0000 mg | ORAL_TABLET | Freq: Every day | ORAL | 1 refills | Status: DC

## 2018-03-25 ENCOUNTER — Telehealth: Payer: Self-pay | Admitting: Neurology

## 2018-03-25 ENCOUNTER — Other Ambulatory Visit: Payer: Self-pay | Admitting: Neurology

## 2018-03-25 DIAGNOSIS — G2581 Restless legs syndrome: Secondary | ICD-10-CM

## 2018-03-25 MED ORDER — ROPINIROLE HCL 4 MG PO TABS: 2.0000 mg | ORAL_TABLET | Freq: Two times a day (BID) | ORAL | 5 refills | Status: DC

## 2018-03-25 MED ORDER — ROPINIROLE HCL ER 4 MG PO TB24: 4.0000 mg | ORAL_TABLET | Freq: Every day | ORAL | 1 refills | Status: DC

## 2018-03-25 NOTE — Telephone Encounter (Signed)
Refilled the patient medication and increased to a 90 day supply. This was just for the immediate and extended release version

## 2018-03-25 NOTE — Telephone Encounter (Signed)
Patient is calling in wanting his script for rOPINIRole (REQUIP) 4 MG tablet to be uped to a 90day supply instead of 30day supply due to virus

## 2018-04-08 ENCOUNTER — Other Ambulatory Visit: Payer: Self-pay | Admitting: Cardiology

## 2018-04-11 ENCOUNTER — Telehealth: Payer: Self-pay

## 2018-04-11 NOTE — Telephone Encounter (Signed)
Pt is requesting a prescription for the Dexcom to be sent to [facility]. He is currently using the [place].

## 2018-04-13 ENCOUNTER — Other Ambulatory Visit: Payer: Self-pay | Admitting: "Endocrinology

## 2018-04-13 MED ORDER — DEXCOM G6 SENSOR MISC: 4.0000 | 2 refills | Status: DC

## 2018-04-13 MED ORDER — DEXCOM G6 RECEIVER DEVI: 1.0000 | 0 refills | Status: DC | PRN

## 2018-04-13 NOTE — Telephone Encounter (Signed)
Will send it in.  

## 2018-04-24 DIAGNOSIS — M25571 Pain in right ankle and joints of right foot: Secondary | ICD-10-CM | POA: Diagnosis not present

## 2018-04-28 ENCOUNTER — Other Ambulatory Visit: Payer: Self-pay | Admitting: "Endocrinology

## 2018-04-29 ENCOUNTER — Other Ambulatory Visit: Payer: Self-pay

## 2018-04-29 MED ORDER — INSULIN DEGLUDEC 100 UNIT/ML ~~LOC~~ SOPN: 92.0000 [IU] | PEN_INJECTOR | Freq: Every day | SUBCUTANEOUS | 2 refills | Status: DC

## 2018-04-29 MED ORDER — INSULIN ASPART 100 UNIT/ML ~~LOC~~ SOLN: 22.0000 [IU] | Freq: Three times a day (TID) | SUBCUTANEOUS | 2 refills | Status: DC

## 2018-05-13 DIAGNOSIS — E1159 Type 2 diabetes mellitus with other circulatory complications: Secondary | ICD-10-CM | POA: Diagnosis not present

## 2018-05-14 ENCOUNTER — Telehealth: Payer: Self-pay | Admitting: Cardiology

## 2018-05-14 LAB — COMPLETE METABOLIC PANEL WITH GFR
AG Ratio: 2 (calc) (ref 1.0–2.5)
ALT: 18 U/L (ref 9–46)
AST: 16 U/L (ref 10–35)
Albumin: 4.2 g/dL (ref 3.6–5.1)
Alkaline phosphatase (APISO): 39 U/L (ref 35–144)
BUN / CREAT RATIO: 25 (calc) — AB (ref 6–22)
BUN: 31 mg/dL — AB (ref 7–25)
CO2: 26 mmol/L (ref 20–32)
Calcium: 9.5 mg/dL (ref 8.6–10.3)
Chloride: 105 mmol/L (ref 98–110)
Creat: 1.25 mg/dL (ref 0.70–1.25)
GFR, Est African American: 69 mL/min/{1.73_m2} (ref >=60)
GFR, Est Non African American: 60 mL/min/{1.73_m2} (ref >=60)
Globulin: 2.1 g/dL (calc) (ref 1.9–3.7)
Glucose, Bld: 174 mg/dL — ABNORMAL HIGH (ref 65–99)
Potassium: 4.3 mmol/L (ref 3.5–5.3)
Sodium: 137 mmol/L (ref 135–146)
Total Bilirubin: 0.3 mg/dL (ref 0.2–1.2)
Total Protein: 6.3 g/dL (ref 6.1–8.1)

## 2018-05-14 LAB — HEMOGLOBIN A1C
EAG (MMOL/L): 12.7 (calc)
Hgb A1c MFr Bld: 9.6 % of total Hgb — ABNORMAL HIGH (ref <5.7)
Mean Plasma Glucose: 229 (calc)

## 2018-05-14 NOTE — Telephone Encounter (Signed)

## 2018-05-15 ENCOUNTER — Telehealth: Payer: Self-pay | Admitting: "Endocrinology

## 2018-05-15 MED ORDER — DEXCOM G6 SENSOR MISC: 4.0000 | 2 refills | Status: DC

## 2018-05-15 NOTE — Telephone Encounter (Signed)
Pts tresiba prescription assistance forms faxed 3 times with fax confirmation all 3 times.

## 2018-05-16 ENCOUNTER — Ambulatory Visit (INDEPENDENT_AMBULATORY_CARE_PROVIDER_SITE_OTHER): Payer: Medicare HMO | Admitting: "Endocrinology

## 2018-05-16 ENCOUNTER — Encounter: Payer: Self-pay | Admitting: "Endocrinology

## 2018-05-16 ENCOUNTER — Ambulatory Visit: Payer: Medicare HMO | Admitting: "Endocrinology

## 2018-05-16 ENCOUNTER — Other Ambulatory Visit: Payer: Self-pay

## 2018-05-16 DIAGNOSIS — E782 Mixed hyperlipidemia: Secondary | ICD-10-CM

## 2018-05-16 DIAGNOSIS — E1159 Type 2 diabetes mellitus with other circulatory complications: Secondary | ICD-10-CM | POA: Diagnosis not present

## 2018-05-16 DIAGNOSIS — I10 Essential (primary) hypertension: Secondary | ICD-10-CM

## 2018-05-16 DIAGNOSIS — E038 Other specified hypothyroidism: Secondary | ICD-10-CM

## 2018-05-16 MED ORDER — INSULIN ASPART 100 UNIT/ML ~~LOC~~ SOLN: 25.0000 [IU] | Freq: Three times a day (TID) | SUBCUTANEOUS | 2 refills | Status: DC

## 2018-05-16 NOTE — Progress Notes (Signed)
05/16/2018                                                     Endocrinology Telehealth Visit Follow up Note -During COVID -19 Pandemic  This visit type was conducted due to national recommendations for restrictions regarding the COVID-19 Pandemic  in an effort to limit this patient's exposure and mitigate transmission of the corona virus.  Due to his co-morbid illnesses, Paul Baker is at  moderate to high risk for complications without adequate follow up.  This format is felt to be most appropriate for him at this time.  I connected with this patient on 05/16/2018   by telephone and verified that I am speaking with the correct person using two identifiers. Paul Baker, 02/20/1950. he has verbally consented to this visit. All issues noted in this document were discussed and addressed. The format was not optimal for physical exam.       Subjective:    Patient ID: Paul Baker, male    DOB: [birth year], PCP [name], MD   Past Medical History:  Diagnosis Date  . Arthritis   . Asthma   . Colon polyp   . Coronary atherosclerosis of native coronary artery    a. 2011: cath showing 90% stenosis along small non-dominant RCA (too small for PCI). b. 01/2018: cath showing nonobstructive CAD with 60 to 70% proximal to mid nondominant RCA stenosis, 50% mid LAD and 40 to 50% OM1  . DVT (deep venous thrombosis) ([place]) 2005   Right arm  . Essential hypertension, benign   . Headache   . History of transfusion   . Hypersomnia    CPAP of 16 cm, diagnosed with AHI of 60 in 2012,[place] 21- narcolepsy?  [name] Hypothyroidism   . Mixed hyperlipidemia   . Morbid obesity ([place])   . MRSA (methicillin resistant staph aureus) culture positive    08/2012  . Narcolepsy   . OSA (obstructive sleep apnea)    CPAP  . Pneumonia   . Psoriasis   . Pulmonary embolism ([place]) 2004  . RLS (restless legs syndrome)   . Rotator cuff disorder    Left  . Septic arthritis of knee, left ([place])   . Skin  cancer, basal cell   . Type 2 diabetes mellitus ([place])    Past Surgical History:  Procedure Laterality Date  . BACK SURGERY    . CATARACT EXTRACTION Bilateral   . COLONOSCOPY    . CYST REMOVAL TRUNK     from back  . EYE SURGERY Left 2016   laser to left eye  . HIP SURGERY     bone removed from both sides of hip  . KNEE ARTHROTOMY Right 12/04/2014   Procedure: KNEE ARTHROTOMY PATELLA LIGAMENT RECONSTRUSION AND REPAIR RIGHT KNEE;  Surgeon: Paralee Cancel, MD;  Location: [facility];  Service: Orthopedics;  Laterality: Right;  . KNEE SURGERY     X 25 TIMES  . LEFT HEART CATH AND CORONARY ANGIOGRAPHY N/A 01/17/2018   Procedure: LEFT HEART CATH AND CORONARY ANGIOGRAPHY;  Surgeon: Belva Crome, MD;  Location: [facility];  Service: Cardiovascular;  Laterality: N/A;  . LUMBAR DISC SURGERY     Left L3, L4, L5 discecotomy with decompression of L4 root  . TONSILLECTOMY    . TOTAL KNEE ARTHROPLASTY  2003   LEFT  . TOTAL KNEE ARTHROPLASTY Right 03/23/2014   Procedure: RIGHT TOTAL KNEE ARTHROPLASTY AND REMOVAL RIGHT TIBIAL  DEEP IMPLANT STAPLE;  Surgeon: Paralee Cancel, MD;  Location: [facility];  Service: Orthopedics;  Laterality: Right;  . TOTAL KNEE REVISION  2005   LEFT  . WRIST SURGERY     Social History   Socioeconomic History  . Marital status: Married    Spouse name: Paul Baker  . Number of children: 2  . Years of education: college  . Highest education level: Not on file  Occupational History  . Occupation: Disabled    Fish farm manager: UNEMPLOYED  Social Needs  . Financial resource strain: Not on file  . Food insecurity:    Worry: Not on file    Inability: Not on file  . Transportation needs:    Medical: Not on file    Non-medical: Not on file  Tobacco Use  . Smoking status: Former Smoker    Types: Cigarettes    Start date: 06/19/1967    Last attempt to quit: 01/03/1995    Years since quitting: 23.3  . Smokeless tobacco: Never Used  Substance and Sexual Activity  . Alcohol use: No     Alcohol/week: 0.0 standard drinks    Comment: quit drinking in 07/86  . Drug use: No  . Sexual activity: Yes    Partners: Female  Lifestyle  . Physical activity:    Days per week: Not on file    Minutes per session: Not on file  . Stress: Not on file  Relationships  . Social connections:    Talks on phone: Not on file    Gets together: Not on file    Attends religious service: Not on file    Active member of club or organization: Not on file    Attends meetings of clubs or organizations: Not on file    Relationship status: Not on file  Other Topics Concern  . Not on file  Social History Narrative    68 year old, right-handed, caucasian male with a past medical history of obesity, hypertension, hyperlipidemia, diabetes, obstructive sleep apnea, presenting with frequent nighttime awakenings, excessive daytime sleepiness, also transient confusional episodes.RLS and one beosity, OSA on CPAP with AHI of 3.2 and  setting of 16 cm water , [facility] .   Outpatient Encounter Medications as of 05/16/2018  Medication Sig  . albuterol (PROVENTIL HFA;VENTOLIN HFA) 108 (90 Base) MCG/ACT inhaler Inhale 2 puffs into the lungs every 6 (six) hours as needed for wheezing or shortness of breath.  [name] amLODipine (NORVASC) 5 MG tablet Take 2.5 mg by mouth daily.  [name] aspirin EC 81 MG tablet Take 1 tablet (81 mg total) by mouth daily.  . B-D ULTRAFINE III SHORT PEN 31G X 8 MM MISC USING FIVE TIMES DAILY.  . benzonatate (TESSALON) 100 MG capsule Take 1 capsule (100 mg total) by mouth 3 (three) times daily as needed for cough.  . Continuous Blood Gluc Receiver (DEXCOM G6 RECEIVER) DEVI 1 Piece by Does not apply route as needed.  . Continuous Blood Gluc Sensor (DEXCOM G6 SENSOR) MISC 4 Pieces by Does not apply route once a week.  . Continuous Blood Gluc Sensor (FREESTYLE LIBRE 14 DAY SENSOR) MISC 1 each by Does not apply route every 14 (fourteen) days.  [name] dextromethorphan (DELSYM) 30 MG/5ML liquid Take 60 mg  by mouth as needed for cough.  . fenofibrate 160 MG tablet TAKE 1 TABLET BY MOUTH AT BEDTIME -  .  furosemide (LASIX) 40 MG tablet Take 40 mg by mouth. Take 40 mg alternating with 80 mg Daily  . insulin aspart (NOVOLOG) 100 UNIT/ML injection Inject 25-31 Units into the skin 3 (three) times daily before meals.  . insulin degludec (TRESIBA FLEXTOUCH) 100 UNIT/ML SOPN FlexTouch Pen Inject 0.92 mLs (92 Units total) into the skin at bedtime.  . Insulin Syringe-Needle U-100 (INSULIN SYRINGE 1CC/31GX5/16") 31G X 5/16" 1 ML MISC 1 each by Does not apply route 2 (two) times daily.  . isosorbide mononitrate (IMDUR) 60 MG 24 hr tablet Take 60 mg ( 1 Tablet)  in the AM and Take 30 mg ( 1/2 Tablet)  in the PM  . levothyroxine (SYNTHROID) 137 MCG tablet TAKE 1 TABLET BY MOUTH ONCE DAILY IN THE MORNING BEFORE BREAKFAST  . loratadine (CLARITIN) 10 MG tablet Take 10 mg by mouth daily as needed for allergies.   [name] losartan (COZAAR) 25 MG tablet Take 25 mg by mouth daily.  . metFORMIN (GLUCOPHAGE) 500 MG tablet TAKE 1 TABLET BY MOUTH TWICE DAILY WITH MEALS  . metoprolol succinate (TOPROL-XL) 50 MG 24 hr tablet Take 1 tablet by mouth twice daily  . modafinil (PROVIGIL) 200 MG tablet Take 1 tablet (200 mg total) by mouth daily.  . nitroGLYCERIN (NITROSTAT) 0.4 MG SL tablet DISSOLVE 1 TABLET UNDER TONGUE EVERY 5 MINUTES UP TO 15 MIN FOR CHESTPAIN. IF NO RELIEF CALL 911.  [name] ONE TOUCH ULTRA TEST test strip TEST BLOOD SUGAR UP TO 4 TIMES DAILY.  [name] ONETOUCH DELICA LANCETS 16X MISC USE AS DIRECTED TO TEST BLOOD SUGAR 4 TIMES DAILY.  . pravastatin (PRAVACHOL) 20 MG tablet Take 1 tablet (20 mg total) by mouth every evening.  [name] rOPINIRole (REQUIP XL) 4 MG 24 hr tablet Take 1 tablet (4 mg total) by mouth at bedtime.  [name] rOPINIRole (REQUIP) 4 MG tablet Take 0.5 tablets (2 mg total) by mouth 2 (two) times daily.  . sodium chloride (OCEAN) 0.65 % SOLN nasal spray Place 1-2 sprays into both nostrils daily as needed for congestion.  .  Vitamin D, Ergocalciferol, (DRISDOL) 1.25 MG (50000 UT) CAPS capsule TAKE 1 CAPSULE BY MOUTH ONCE A WEEK  . [DISCONTINUED] insulin aspart (NOVOLOG) 100 UNIT/ML injection Inject 22-28 Units into the skin 3 (three) times daily before meals.   No facility-administered encounter medications on file as of 05/16/2018.    ALLERGIES: Allergies  Allergen Reactions  . Cardizem [Diltiazem Hcl]     Edema   . Cardura [Doxazosin Mesylate]     Headaches / cramps  . Lipitor [Atorvastatin] Other (See Comments)    Leg cramps  . Paxil [Paroxetine Hcl]     Unknown reaction   . Adhesive [Tape] Rash    Pulls off skin  . Codeine Rash and Other (See Comments)    Headache   . Gabapentin Rash   VACCINATION STATUS: Immunization History  Administered Date(s) Administered  . Influenza Split 09/18/2012  . Influenza,inj,Quad PF,6+ Mos 10/13/2013, 10/06/2014  . Influenza-Unspecified 12/01/2015, 11/08/2016  . Pneumococcal Polysaccharide-23 10/02/2004, 10/03/2011  . Td 05/24/2006    Diabetes  He presents for his follow-up diabetic visit. He has type 2 diabetes mellitus. Onset time: Was diagnosed at approximate age of 87 years. His disease course has been worsening. There are no hypoglycemic associated symptoms. Pertinent negatives for hypoglycemia include no confusion, headaches, pallor or seizures. Associated symptoms include polydipsia and polyuria. Pertinent negatives for diabetes include no chest pain, no fatigue, no polyphagia and no weakness. There are no  hypoglycemic complications. Symptoms are worsening. Diabetic complications include heart disease. Risk factors for coronary artery disease include diabetes mellitus, dyslipidemia, male sex, obesity, sedentary lifestyle and tobacco exposure. Current diabetic treatment includes intensive insulin program. He is compliant with treatment most of the time. His weight is increasing steadily. He is following a generally unhealthy diet. He has had a previous visit with  a dietitian. He never participates in exercise. His home blood glucose trend is fluctuating minimally. His breakfast blood glucose range is generally 140-180 mg/dl. His lunch blood glucose range is generally >200 mg/dl. His dinner blood glucose range is generally >200 mg/dl. His overall blood glucose range is >200 mg/dl. (His previsit labs show A1c of 9.2%, unchanged from his last time.) An ACE inhibitor/angiotensin II receptor blocker is being taken. He sees a podiatrist.Eye exam is current.  Hyperlipidemia  This is a chronic problem. The current episode started more than 1 year ago. The problem is controlled. Exacerbating diseases include diabetes, hypothyroidism and obesity. Pertinent negatives include no chest pain, myalgias or shortness of breath. Current antihyperlipidemic treatment includes statins. Risk factors for coronary artery disease include dyslipidemia, diabetes mellitus, hypertension, male sex, obesity and a sedentary lifestyle.  Hypertension  This is a chronic problem. The current episode started more than 1 year ago. The problem is controlled. Pertinent negatives include no chest pain, headaches, neck pain, palpitations or shortness of breath. Risk factors for coronary artery disease include diabetes mellitus, dyslipidemia, male gender, obesity and sedentary lifestyle. Past treatments include angiotensin blockers. Hypertensive end-organ damage includes CAD/MI. Identifiable causes of hypertension include a thyroid problem.  Thyroid Problem  Presents for follow-up visit. Patient reports no constipation, diarrhea, fatigue or palpitations. The symptoms have been improving. Past treatments include levothyroxine. His past medical history is significant for diabetes and hyperlipidemia.   Review of systems: Limited as above.   Objective:    There were no vitals taken for this visit.  Wt Readings from Last 3 Encounters:  02/14/18 220 lb (99.8 kg)  01/17/18 216 lb (98 kg)  01/15/18 223 lb  (101.2 kg)     CMP Latest Ref Rng & Units 05/13/2018 01/14/2018 01/14/2018  Glucose 65 - 99 mg/dL 174(H) 171(H) 181(H)  BUN 7 - 25 mg/dL 31(H) 21 22  Creatinine 0.70 - 1.25 mg/dL 1.25 1.20 1.01  Sodium 135 - 146 mmol/L 137 135 137  Potassium 3.5 - 5.3 mmol/L 4.3 4.3 4.6  Chloride 98 - 110 mmol/L 105 102 105  CO2 20 - 32 mmol/L 26 26 25   Calcium 8.6 - 10.3 mg/dL 9.5 9.2 9.2  Total Protein 6.1 - 8.1 g/dL 6.3 - 6.5  Total Bilirubin 0.2 - 1.2 mg/dL 0.3 - 0.2  Alkaline Phos 40 - 115 U/L - - -  AST 10 - 35 U/L 16 - 13  ALT 9 - 46 U/L 18 - 12     Diabetic Labs (most recent): Lab Results  Component Value Date   HGBA1C 9.6 (H) 05/13/2018   HGBA1C 9.2 (H) 01/14/2018   HGBA1C 9.4 (H) 10/08/2017   Lipid Panel     Component Value Date/Time   CHOL 152 01/14/2018 0749   CHOL 148 08/19/2015 0914   TRIG 107 01/14/2018 0749   HDL 49 01/14/2018 0749   HDL 53 08/19/2015 0914   CHOLHDL 3.1 01/14/2018 0749   VLDL 31 11/13/2013 0843   LDLCALC 83 01/14/2018 0749      Assessment & Plan:   1. Uncontrolled type 2 diabetes mellitus with circulatory complication, with  long-term current use of insulin (HCC)  -His diabetes is  complicated by coronary artery disease and patient remains at an extremely high risk for more acute and chronic complications of diabetes which include CAD, CVA, CKD, retinopathy, and neuropathy. These are all discussed in detail with the patient.  -He denies overdosing insulin or taking any other anti-diabetes medications.    -His previsit labs show A1c increasing to 9.6% from 9.2%, largely due to postprandial hyperglycemia.       Glucose logs and insulin administration records pertaining to this visit,  to be scanned into patient's records.  Recent labs reviewed.  - I have re-counseled the patient on diet management and weight loss  by adopting a carbohydrate restricted / protein rich  Diet.  - Patient admits there is a room for improvement in his diet and drink  choices. -  Suggestion is made for him to avoid simple carbohydrates  from his diet including Cakes, Sweet Desserts / Pastries, Ice Cream, Soda (diet and regular), Sweet Tea, Candies, Chips, Cookies, Store Bought Juices, Alcohol in Excess of  1-2 drinks a day, Artificial Sweeteners, and "Sugar-free" Products. This will help patient to have stable blood glucose profile and potentially avoid unintended weight gain.  - Patient is advised to stick to a routine mealtimes to eat 3 meals  a day and avoid unnecessary snacks ( to snack only to correct hypoglycemia).   - I have approached patient with the following individualized plan to manage diabetes and patient agrees.  -He will continue to require intensive treatment with basal/bolus insulin in order for him to achieve and maintain control of diabetes to target.  -He is advised to increase Tresiba to 92 units nightly, advised to increase NovoLog to 25-31 units 3 times a day before breakfast, lunch, and supper for pre-meal blood glucose  above 90 mg/dL.  - He is currently using CGM, advised to continue document blood glucose at least 4 times a day-before meals and at bedtime.   -Patient is encouraged to call clinic for blood glucose levels less than 70 or above 200 mg /dl x 3. -For the last 3 visits, his renal function has been stable.  He will continue to benefit from metformin therapy.  He is advised to continue metformin 500 mg p.o. twice daily-daily after breakfast and supper.    2) BP/HTN: he is advised to home monitor blood pressure and report if > 140/90 on 2 separate readings.   He is advised to continue  his current blood pressure medications including amlodipine, metoprolol.  He will be considered for low-dose lisinopril if he has microalbuminuria.   3) Lipids/HPL: His recent labs show LDL controlled at 80.  He is advised to continue pravastatin 20 mg p.o. nightly.    4)  Weight/Diet: CDE consult in progress, exercise, and carbohydrates  information provided.  5) hypothyroidism -His recent thyroid function tests are consistent with appropriate replacement.  He is advised to continue levothyroxine 137 mcg p.o. every morning.    - We discussed about the correct intake of his thyroid hormone, on empty stomach at fasting, with water, separated by at least 30 minutes from breakfast and other medications,  and separated by more than 4 hours from calcium, iron, multivitamins, acid reflux medications (PPIs). -Patient is made aware of the fact that thyroid hormone replacement is needed for life, dose to be adjusted by periodic monitoring of thyroid function tests.   6) Chronic Care/Health Maintenance:  -Patient is on ACEI/ARB and Statin medications  and encouraged to continue to follow up with Ophthalmology, Podiatrist at least yearly or according to recommendations, and advised to  stay away from smoking. I have recommended yearly flu vaccine and pneumonia vaccination at least every 5 years; moderate intensity exercise for up to 150 minutes weekly; and  sleep for at least 7 hours a day.  - I advised patient to maintain close follow up with [name], MD for primary care needs.  - Time spent with the patient: 25 min, of which >50% was spent in reviewing his blood glucose logs , discussing his hypoglycemia and hyperglycemia episodes, reviewing his current and  previous labs / studies and medications  doses and developing a plan to avoid hypoglycemia and hyperglycemia. Please refer to Patient Instructions for Blood Glucose Monitoring and Insulin/Medications Dosing Guide"  in media tab for additional information. Please  also refer to " Patient Self Inventory" in the Media  tab for reviewed elements of pertinent patient history.  Paul Baker participated in the discussions, expressed understanding, and voiced agreement with the above plans.  All questions were answered to his satisfaction. he is encouraged to contact clinic should  he have any questions or concerns prior to his return visit.   Follow up plan: -Return in about 4 months (around 09/16/2018) for Follow up with Pre-visit Labs, Meter, and Logs.  Glade Lloyd, MD Phone: [phone]  Fax: [phone]  This note was partially dictated with voice recognition software. Similar sounding words can be transcribed inadequately or may not  be corrected upon review.  05/16/2018, 5:06 PM

## 2018-05-20 NOTE — Progress Notes (Signed)
Virtual Visit via Video Note   This visit type was conducted due to national recommendations for restrictions regarding the COVID-19 Pandemic (e.g. social distancing) in an effort to limit this patient's exposure and mitigate transmission in our community.  Due to his co-morbid illnesses, this patient is at least at moderate risk for complications without adequate follow up.  This format is felt to be most appropriate for this patient at this time.  All issues noted in this document were discussed and addressed.  A limited physical exam was performed with this format.  Please refer to the patient's chart for his consent to telehealth for [facility].   Date:  05/21/2018   ID:  Paul Baker, DOB [birth year], MRN [id number]  Patient Location: Home Provider Location: Office  PCP:  Paul Kirschner, MD  Cardiologist:  Paul Lesches, MD  Evaluation Performed:  Follow-Up Visit  Chief Complaint:   Cardiac follow-up  History of Present Illness:    Paul Baker is a 68 y.o. male last seen in February by Ms. Strader PA-C.  We communicated via video conferencing today.  He still reports dyspnea on exertion, intermittent wheezing.  He has albuterol that he uses occasionally with some improvement but not resolution of symptoms.  He has not been as active since the gyms have been closed during the COVID-19 pandemic.  Cardiac catheterization in January demonstrated moderate, nonobstructive CAD with most significant stenosis being a 60 to 70% proximal to mid nondominant RCA.  LVEDP was 17 mmHg.  Echocardiogram subsequently revealed LVEF 55 to 60% with indeterminate diastolic filling pattern, normal right ventricular contraction.  Medical therapy continues.  Has has been on Imdur at 60 mg in the morning and 30 mg in the afternoon.  I reviewed his medications which are listed below.  We discussed increasing Norvasc to 5 mg daily, otherwise no changes in cardiac regimen.  I also talked with  him about seeing Dr. Wolfgang Phoenix to discuss whether he might need PFTs or pulmonary evaluation.  The patient does not have symptoms concerning for COVID-19 infection (fever, chills, cough, or new shortness of breath).    Past Medical History:  Diagnosis Date  . Arthritis   . Asthma   . Colon polyp   . Coronary atherosclerosis of native coronary artery    a. 2011: cath showing 90% stenosis along small non-dominant RCA (too small for PCI). b. 01/2018: cath showing nonobstructive CAD with 60 to 70% proximal to mid nondominant RCA stenosis, 50% mid LAD and 40 to 50% OM1  . DVT (deep venous thrombosis) ([place]) 2005   Right arm  . Essential hypertension, benign   . Headache   . History of transfusion   . Hypersomnia    CPAP of 16 cm, diagnosed with AHI of 60 in 2012,[place] 21- narcolepsy?  [name] Hypothyroidism   . Mixed hyperlipidemia   . Morbid obesity ([place])   . MRSA (methicillin resistant staph aureus) culture positive    08/2012  . Narcolepsy   . OSA (obstructive sleep apnea)    CPAP  . Pneumonia   . Psoriasis   . Pulmonary embolism ([place]) 2004  . RLS (restless legs syndrome)   . Rotator cuff disorder    Left  . Septic arthritis of knee, left ([place])   . Skin cancer, basal cell   . Type 2 diabetes mellitus ([place])    Past Surgical History:  Procedure Laterality Date  . BACK SURGERY    . CATARACT EXTRACTION Bilateral   .  COLONOSCOPY    . CYST REMOVAL TRUNK     from back  . EYE SURGERY Left 2016   laser to left eye  . HIP SURGERY     bone removed from both sides of hip  . KNEE ARTHROTOMY Right 12/04/2014   Procedure: KNEE ARTHROTOMY PATELLA LIGAMENT RECONSTRUSION AND REPAIR RIGHT KNEE;  Surgeon: Paralee Cancel, MD;  Location: [facility];  Service: Orthopedics;  Laterality: Right;  . KNEE SURGERY     X 25 TIMES  . LEFT HEART CATH AND CORONARY ANGIOGRAPHY N/A 01/17/2018   Procedure: LEFT HEART CATH AND CORONARY ANGIOGRAPHY;  Surgeon: Belva Crome, MD;  Location: [facility];  Service:  Cardiovascular;  Laterality: N/A;  . LUMBAR DISC SURGERY     Left L3, L4, L5 discecotomy with decompression of L4 root  . TONSILLECTOMY    . TOTAL KNEE ARTHROPLASTY  2003   LEFT  . TOTAL KNEE ARTHROPLASTY Right 03/23/2014   Procedure: RIGHT TOTAL KNEE ARTHROPLASTY AND REMOVAL RIGHT TIBIAL  DEEP IMPLANT STAPLE;  Surgeon: Paralee Cancel, MD;  Location: [facility];  Service: Orthopedics;  Laterality: Right;  . TOTAL KNEE REVISION  2005   LEFT  . WRIST SURGERY       Current Meds  Medication Sig  . albuterol (PROVENTIL HFA;VENTOLIN HFA) 108 (90 Base) MCG/ACT inhaler Inhale 2 puffs into the lungs every 6 (six) hours as needed for wheezing or shortness of breath.  [name] amLODipine (NORVASC) 5 MG tablet Take 1 tablet (5 mg total) by mouth daily.  [name] aspirin EC 81 MG tablet Take 1 tablet (81 mg total) by mouth daily.  . B-D ULTRAFINE III SHORT PEN 31G X 8 MM MISC USING FIVE TIMES DAILY.  [name] Continuous Blood Gluc Receiver (DEXCOM G6 RECEIVER) DEVI 1 Piece by Does not apply route as needed.  . Continuous Blood Gluc Sensor (DEXCOM G6 SENSOR) MISC 4 Pieces by Does not apply route once a week.  . Continuous Blood Gluc Sensor (FREESTYLE LIBRE 14 DAY SENSOR) MISC 1 each by Does not apply route every 14 (fourteen) days.  . fenofibrate 160 MG tablet TAKE 1 TABLET BY MOUTH AT BEDTIME -  . furosemide (LASIX) 40 MG tablet Take 40 mg by mouth. Take 40 mg alternating with 80 mg Daily  . insulin aspart (NOVOLOG) 100 UNIT/ML injection Inject 25-31 Units into the skin 3 (three) times daily before meals.  . insulin degludec (TRESIBA FLEXTOUCH) 100 UNIT/ML SOPN FlexTouch Pen Inject 0.92 mLs (92 Units total) into the skin at bedtime.  . Insulin Syringe-Needle U-100 (INSULIN SYRINGE 1CC/31GX5/16") 31G X 5/16" 1 ML MISC 1 each by Does not apply route 2 (two) times daily.  . isosorbide mononitrate (IMDUR) 60 MG 24 hr tablet Take 60 mg ( 1 Tablet)  in the AM and Take 30 mg ( 1/2 Tablet)  in the PM  . levothyroxine (SYNTHROID) 137 MCG  tablet TAKE 1 TABLET BY MOUTH ONCE DAILY IN THE MORNING BEFORE BREAKFAST  . loratadine (CLARITIN) 10 MG tablet Take 10 mg by mouth daily as needed for allergies.   [name] losartan (COZAAR) 25 MG tablet Take 25 mg by mouth daily.  . metFORMIN (GLUCOPHAGE) 500 MG tablet TAKE 1 TABLET BY MOUTH TWICE DAILY WITH MEALS  . metoprolol succinate (TOPROL-XL) 50 MG 24 hr tablet Take 1 tablet by mouth twice daily  . modafinil (PROVIGIL) 200 MG tablet Take 1 tablet (200 mg total) by mouth daily.  . nitroGLYCERIN (NITROSTAT) 0.4 MG SL tablet DISSOLVE 1  TABLET UNDER TONGUE EVERY 5 MINUTES UP TO [place]. IF NO RELIEF CALL 911.  [name] ONE TOUCH ULTRA TEST test strip TEST BLOOD SUGAR UP TO 4 TIMES DAILY.  [name] ONETOUCH DELICA LANCETS 63K MISC USE AS DIRECTED TO TEST BLOOD SUGAR 4 TIMES DAILY.  . pravastatin (PRAVACHOL) 20 MG tablet Take 1 tablet (20 mg total) by mouth every evening.  [name] rOPINIRole (REQUIP XL) 4 MG 24 hr tablet Take 1 tablet (4 mg total) by mouth at bedtime.  [name] rOPINIRole (REQUIP) 4 MG tablet Take 0.5 tablets (2 mg total) by mouth 2 (two) times daily.  . sodium chloride (OCEAN) 0.65 % SOLN nasal spray Place 1-2 sprays into both nostrils daily as needed for congestion.  . Vitamin D, Ergocalciferol, (DRISDOL) 1.25 MG (50000 UT) CAPS capsule Take 1 capsule by mouth once a week  . [DISCONTINUED] amLODipine (NORVASC) 5 MG tablet Take 2.5 mg by mouth daily.     Allergies:   Cardizem [diltiazem hcl]; Cardura [doxazosin mesylate]; Lipitor [atorvastatin]; Paxil [paroxetine hcl]; Adhesive [tape]; Codeine; and Gabapentin   Social History   Tobacco Use  . Smoking status: Former Smoker    Types: Cigarettes    Start date: 06/19/1967    Last attempt to quit: 01/03/1995    Years since quitting: 23.3  . Smokeless tobacco: Never Used  Substance Use Topics  . Alcohol use: No    Alcohol/week: 0.0 standard drinks    Comment: quit drinking in 07/86  . Drug use: No     Family Hx: The patient's family history  includes Diabetes in his sister; Heart attack in his father; Hypertension in his father; Kidney Stones in his father; Narcolepsy in his grandchild; Seizures in his grandchild.  ROS:   Please see the history of present illness.    All other systems reviewed and are negative.   Prior CV studies:   The following studies were reviewed today:  Cardiac catheterization 01/17/2018:  Nonobstructive coronary artery disease with 60 to 70% proximal to mid nondominant right coronary, 50% mid LAD, and 40 to 50% first obtuse marginal narrowing.  Normal left ventricular systolic function with EF 60%.  LVEDP is 17 mmHg.  Failed right heart catheterization from right antecubital vein due to distal subclavian stenosis likely from prior PICC line.  Further attempts a right heart cath were aborted as IV heparin had been given for left heart catheterization.  RECOMMENDATIONS:   INOCA (ischemia with no obstructive coronary artery disease).  Aggressive risk factor modification including excellent lipid control.  2D Doppler echocardiogram to assess RV and estimate PA pressure.  Right heart cath from femoral approach her left arm could be attempted in the future if that information is absolutely needed.  He is not compliant with CPAP which may be the driver for his recent decrease in exertional tolerance and chest pain.  Echocardiogram 02/21/2018:  1. The left ventricle has normal systolic function, with an ejection fraction of 55-60%. The cavity size was normal. There is mildly increased left ventricular wall thickness. Left ventricular diastolic Doppler parameters are indeterminate Indeterminent  filling pressures.  2. The right ventricle has normal systolic function. The cavity was normal. There is no increase in right ventricular wall thickness.  3. Left atrial size was mildly dilated.  4. The mitral valve is normal in structure. No evidence of mitral valve stenosis.  5. The tricuspid valve is normal in  structure.  6. The aortic valve is tricuspid Mild thickening of the aortic valve no  stenosis of the aortic valve.  7. The aortic root is normal in size and structure.  8. Pulmonary hypertension is indeterminant, inadequate TR jet.  9. The inferior vena cava was dilated in size with >50% respiratory variability.  Labs/Other Tests and Data Reviewed:    EKG:  An ECG dated 01/17/2018 was personally reviewed today and demonstrated:  Sinus bradycardia with probable old inferior infarct pattern.  Recent Labs: 10/08/2017: TSH 0.99 01/14/2018: Hemoglobin 12.6; Platelets 282 05/13/2018: ALT 18; BUN 31; Creat 1.25; Potassium 4.3; Sodium 137   Recent Lipid Panel Lab Results  Component Value Date/Time   CHOL 152 01/14/2018 07:49 AM   CHOL 148 08/19/2015 09:14 AM   TRIG 107 01/14/2018 07:49 AM   HDL 49 01/14/2018 07:49 AM   HDL 53 08/19/2015 09:14 AM   CHOLHDL 3.1 01/14/2018 07:49 AM   LDLCALC 83 01/14/2018 07:49 AM    Wt Readings from Last 3 Encounters:  05/21/18 223 lb (101.2 kg)  02/14/18 220 lb (99.8 kg)  01/17/18 216 lb (98 kg)     Objective:    Vital Signs:  BP 140/72   Pulse 70   Ht 5\' 5"  (1.651 m)   Wt 223 lb (101.2 kg)   SpO2 96%   BMI 37.11 kg/m    General: Patient appears comfortable at rest seated in his home. HEENT: Conjunctiva and lids normal. Lungs: Patient spoke in full sentences, not short of breath.  No audible wheezing. Skin: Normal appearance of color and turgor. Neuropsychiatric: Gaze is conjugate.  Speech pattern normal.  Patient moves all extremities.  Affect is appropriate.  ASSESSMENT & PLAN:    1.  CAD, nonobstructive by recent cardiac catheterization with most significant stenosis being a 60 to 70% proximal to mid nondominant RCA.  LVEF is normal by echocardiography.  He was not able to undergo a right heart catheterization at the same time due to difficult access.  He reports chronic dyspnea on exertion which is likely multifactorial.  Might even be a  pulmonary component, I recommended that he see Dr. Wolfgang Phoenix to discuss whether PFTs or pulmonary evaluation might be in order.  He gets some benefit from albuterol, but not complete resolution of symptoms.  We will otherwise continue medical therapy for CAD.  2.  Essential hypertension, continue medical therapy with increase in Norvasc to 5 mg daily.  3.  Mixed hyperlipidemia, continues on statin therapy.  4.  OSA on CPAP.  COVID-19 Education: The signs and symptoms of COVID-19 were discussed with the patient and how to seek care for testing (follow up with PCP or arrange E-visit).  The importance of social distancing was discussed today.  Time:   Today, I have spent 10 minutes with the patient with telehealth technology discussing the above problems.     Medication Adjustments/Labs and Tests Ordered: Current medicines are reviewed at length with the patient today.  Concerns regarding medicines are outlined above.   Tests Ordered: No orders of the defined types were placed in this encounter.   Medication Changes: Meds ordered this encounter  Medications  . amLODipine (NORVASC) 5 MG tablet    Sig: Take 1 tablet (5 mg total) by mouth daily.    Dispense:  90 tablet    Refill:  2    05/21/2018 dose increase    Disposition:  Follow up 6 months in the [place] office.  Signed, Paul Lesches, MD  05/21/2018 1:15 PM    [facility]

## 2018-05-21 ENCOUNTER — Other Ambulatory Visit: Payer: Self-pay | Admitting: "Endocrinology

## 2018-05-21 ENCOUNTER — Telehealth (INDEPENDENT_AMBULATORY_CARE_PROVIDER_SITE_OTHER): Payer: Medicare HMO | Admitting: Cardiology

## 2018-05-21 ENCOUNTER — Encounter: Payer: Self-pay | Admitting: Cardiology

## 2018-05-21 VITALS — BP 140/72 | HR 70 | Ht 65.0 in | Wt 223.0 lb

## 2018-05-21 DIAGNOSIS — Z7189 Other specified counseling: Secondary | ICD-10-CM

## 2018-05-21 DIAGNOSIS — G4733 Obstructive sleep apnea (adult) (pediatric): Secondary | ICD-10-CM | POA: Diagnosis not present

## 2018-05-21 DIAGNOSIS — I25119 Atherosclerotic heart disease of native coronary artery with unspecified angina pectoris: Secondary | ICD-10-CM | POA: Diagnosis not present

## 2018-05-21 DIAGNOSIS — Z9989 Dependence on other enabling machines and devices: Secondary | ICD-10-CM

## 2018-05-21 DIAGNOSIS — E782 Mixed hyperlipidemia: Secondary | ICD-10-CM | POA: Diagnosis not present

## 2018-05-21 DIAGNOSIS — I10 Essential (primary) hypertension: Secondary | ICD-10-CM

## 2018-05-21 MED ORDER — AMLODIPINE BESYLATE 5 MG PO TABS: 5.0000 mg | ORAL_TABLET | Freq: Every day | ORAL | 2 refills | Status: DC

## 2018-05-21 NOTE — Patient Instructions (Addendum)
Medication Instructions:   Your physician has recommended you make the following change in your medication:   Increase amlodipine to 5 mg by mouth daily  Continue all other medications the same  Labwork:  NONE  Testing/Procedures:  NONE  Follow-Up: Your physician recommends that you schedule a follow-up appointment in: 6 months. You will receive a reminder letter in the mail in about 4 months reminding you to call and schedule your appointment. If you don't receive this letter, please contact our office.  Any Other Special Instructions Will Be Listed Below (If Applicable).  If you need a refill on your cardiac medications before your next appointment, please call your pharmacy.

## 2018-05-24 DIAGNOSIS — M25571 Pain in right ankle and joints of right foot: Secondary | ICD-10-CM | POA: Diagnosis not present

## 2018-05-30 ENCOUNTER — Telehealth: Payer: Self-pay | Admitting: *Deleted

## 2018-05-30 NOTE — Telephone Encounter (Signed)
Due to current COVID 19 pandemic, our office is severely reducing in office visits until further notice, in order to minimize the risk to our patients and healthcare providers.  Pt understands that although there may be some limitations with this type of visit, we will take all precautions to reduce any security or privacy concerns.  Pt understands that this will be treated like an in office visit and we will file with pt's insurance, and there may be a patient responsible charge related to this service. Pt consented to [email or web address] visit.  email sent to [email or web address].

## 2018-06-02 ENCOUNTER — Encounter: Payer: Self-pay | Admitting: Adult Health

## 2018-06-04 ENCOUNTER — Ambulatory Visit (INDEPENDENT_AMBULATORY_CARE_PROVIDER_SITE_OTHER): Payer: Medicare HMO | Admitting: Adult Health

## 2018-06-04 ENCOUNTER — Other Ambulatory Visit: Payer: Self-pay

## 2018-06-04 DIAGNOSIS — G2581 Restless legs syndrome: Secondary | ICD-10-CM

## 2018-06-04 DIAGNOSIS — Z9989 Dependence on other enabling machines and devices: Secondary | ICD-10-CM

## 2018-06-04 DIAGNOSIS — G4733 Obstructive sleep apnea (adult) (pediatric): Secondary | ICD-10-CM | POA: Diagnosis not present

## 2018-06-04 MED ORDER — ROTIGOTINE 1 MG/24HR TD PT24: 1.0000 mg | MEDICATED_PATCH | Freq: Every day | TRANSDERMAL | 5 refills | Status: DC

## 2018-06-04 NOTE — Progress Notes (Signed)
PATIENT: Paul Baker DOB: [birth year]  REASON FOR VISIT: follow up HISTORY FROM: patient  Virtual Visit via Video Note  I connected with Paul Baker on 06/04/18 at 10:30 AM EDT by a video enabled telemedicine application located remotely at [facility] and verified that I am speaking with the correct person using two identifiers who was located at their own home.   I discussed the limitations of evaluation and management by telemedicine and the availability of in person appointments. The patient expressed understanding and agreed to proceed.   PATIENT: Paul Baker DOB: [birth year]  REASON FOR VISIT: follow up HISTORY FROM: patient  HISTORY OF PRESENT ILLNESS: Today 06/04/18  Paul Baker is a 68 year old male with a history of obstructive sleep apnea on CPAP.  His CPAP download indicates that his machine 18 out of 30 days for compliance of 60%.  He is only uses machine greater than 4 hours 10 days for compliance of 33%.  On average he uses his machine 4 hours and 4 minutes.  His residual AHI is 8.7 on 6 to 16 cm of water with EPR 3.  He has a leak in the 95th percentile at 48.9 L/min.  He states that he has trouble using the mask as it continuously leaks.  He states he has a hard time with the straps staying on his head as well.  He recently received new supplies but he continues to leak.  The patient also states that his restless legs has worsened.  He states that they are now occurring throughout the day.  Starting around 10 AM.  He changed the way he takes his medication.  He reports that he is taking the Requip extended release and cutting it in half and taking one half in the morning and one half in the evening.  He takes Requip extended release a half a tablet in the morning and another half before bedtime.  He states that this is not helping his symptoms.  He is up throughout the night due to restless legs.  He has tried gabapentin in the past  but this caused a rash.  To his knowledge he is not been on any other medication.  He joins me today for virtual visit.  HISTORY 10/18/17 Paul Baker is a 68 year old male with a history of obstructive sleep apnea on CPAP.  His CPAP download indicates that he uses machine 42 out of 90 days for compliance of 47%.  He uses machine greater than 4 hours 29 days for compliance of 32%.  On average he uses his machine 4 hours and 56 minutes.  His residual AHI is 4.6 on 6 to 16 cm of water with EPR of 3.  His leak in the 95th percentile is 30.4 L/min.  He reports that he has a hard time using the nasal pillows.  He states that often he feels that he is suffocating or the mask comes off during the night.  He also continues to have trouble with restless legs.  He states that when we switched him to the extended release tablet did offer him benefit for several weeks but now in the last week he has been having more symptoms typically before bedtime and during the night.  He returns today for evaluation.  REVIEW OF SYSTEMS: Out of a complete 14 system review of symptoms, the patient complains only of the following symptoms, and all other reviewed systems are negative.   See HPI ALLERGIES: Allergies  Allergen Reactions   Cardizem [Diltiazem Hcl]     Edema    Cardura [Doxazosin Mesylate]     Headaches / cramps   Lipitor [Atorvastatin] Other (See Comments)    Leg cramps   Paxil [Paroxetine Hcl]     Unknown reaction    Adhesive [Tape] Rash    Pulls off skin   Codeine Rash and Other (See Comments)    Headache    Gabapentin Rash    HOME MEDICATIONS: Outpatient Medications Prior to Visit  Medication Sig Dispense Refill   albuterol (PROVENTIL HFA;VENTOLIN HFA) 108 (90 Base) MCG/ACT inhaler Inhale 2 puffs into the lungs every 6 (six) hours as needed for wheezing or shortness of breath. 1 Inhaler 0   amLODipine (NORVASC) 5 MG tablet Take 1 tablet (5 mg total) by mouth daily. 90 tablet 2    aspirin EC 81 MG tablet Take 1 tablet (81 mg total) by mouth daily. 90 tablet 3   B-D ULTRAFINE III SHORT PEN 31G X 8 MM MISC USING FIVE TIMES DAILY. 150 each 5   Continuous Blood Gluc Receiver (DEXCOM G6 RECEIVER) DEVI 1 Piece by Does not apply route as needed. 1 Device 0   Continuous Blood Gluc Sensor (DEXCOM G6 SENSOR) MISC 4 Pieces by Does not apply route once a week. 4 each 2   Continuous Blood Gluc Sensor (FREESTYLE LIBRE 14 DAY SENSOR) MISC 1 each by Does not apply route every 14 (fourteen) days. 2 each 5   fenofibrate 160 MG tablet TAKE 1 TABLET BY MOUTH AT BEDTIME - 90 tablet 3   furosemide (LASIX) 40 MG tablet Take 40 mg by mouth. Take 40 mg alternating with 80 mg Daily     insulin aspart (NOVOLOG) 100 UNIT/ML injection Inject 25-31 Units into the skin 3 (three) times daily before meals. 30 mL 2   insulin degludec (TRESIBA FLEXTOUCH) 100 UNIT/ML SOPN FlexTouch Pen Inject 0.92 mLs (92 Units total) into the skin at bedtime. 30 mL 2   Insulin Syringe-Needle U-100 (INSULIN SYRINGE 1CC/31GX5/16") 31G X 5/16" 1 ML MISC 1 each by Does not apply route 2 (two) times daily. 100 each 2   isosorbide mononitrate (IMDUR) 60 MG 24 hr tablet Take 60 mg ( 1 Tablet)  in the AM and Take 30 mg ( 1/2 Tablet)  in the PM 135 tablet 3   levothyroxine (SYNTHROID) 137 MCG tablet TAKE 1 TABLET BY MOUTH ONCE DAILY IN THE MORNING BEFORE BREAKFAST 90 tablet 0   loratadine (CLARITIN) 10 MG tablet Take 10 mg by mouth daily as needed for allergies.      losartan (COZAAR) 25 MG tablet Take 25 mg by mouth daily.     metFORMIN (GLUCOPHAGE) 500 MG tablet TAKE 1 TABLET BY MOUTH TWICE DAILY WITH MEALS 180 tablet 0   metoprolol succinate (TOPROL-XL) 50 MG 24 hr tablet Take 1 tablet by mouth twice daily 180 tablet 0   modafinil (PROVIGIL) 200 MG tablet Take 1 tablet (200 mg total) by mouth daily. 90 tablet 1   nitroGLYCERIN (NITROSTAT) 0.4 MG SL tablet DISSOLVE 1 TABLET UNDER TONGUE EVERY 5 MINUTES UP TO 15 MIN  FOR CHESTPAIN. IF NO RELIEF CALL 911. 25 tablet 3   ONE TOUCH ULTRA TEST test strip TEST BLOOD SUGAR UP TO 4 TIMES DAILY. 150 each 5   ONETOUCH DELICA LANCETS 34H MISC USE AS DIRECTED TO TEST BLOOD SUGAR 4 TIMES DAILY. 150 each 5   pravastatin (PRAVACHOL) 20 MG tablet Take 1 tablet (20 mg total)  by mouth every evening. 90 tablet 3   rOPINIRole (REQUIP XL) 4 MG 24 hr tablet Take 1 tablet (4 mg total) by mouth at bedtime. 90 tablet 1   rOPINIRole (REQUIP) 4 MG tablet Take 0.5 tablets (2 mg total) by mouth 2 (two) times daily. 90 tablet 5   sodium chloride (OCEAN) 0.65 % SOLN nasal spray Place 1-2 sprays into both nostrils daily as needed for congestion.     Vitamin D, Ergocalciferol, (DRISDOL) 1.25 MG (50000 UT) CAPS capsule Take 1 capsule by mouth once a week 12 capsule 0   No facility-administered medications prior to visit.     PAST MEDICAL HISTORY: Past Medical History:  Diagnosis Date   Arthritis    Asthma    Colon polyp    Coronary atherosclerosis of native coronary artery    a. 2011: cath showing 90% stenosis along small non-dominant RCA (too small for PCI). b. 01/2018: cath showing nonobstructive CAD with 60 to 70% proximal to mid nondominant RCA stenosis, 50% mid LAD and 40 to 50% OM1   DVT (deep venous thrombosis) ([place]) 2005   Right arm   Essential hypertension, benign    Headache    History of transfusion    Hypersomnia    CPAP of 16 cm, diagnosed with AHI of 60 in 2012,[place] 21- narcolepsy?   Hypothyroidism    Mixed hyperlipidemia    Morbid obesity ([place])    MRSA (methicillin resistant staph aureus) culture positive    08/2012   Narcolepsy    OSA (obstructive sleep apnea)    CPAP   Pneumonia    Psoriasis    Pulmonary embolism ([place]) 2004   RLS (restless legs syndrome)    Rotator cuff disorder    Left   Septic arthritis of knee, left (HCC)    Skin cancer, basal cell    Type 2 diabetes mellitus ([place])     PAST SURGICAL HISTORY: Past  Surgical History:  Procedure Laterality Date   BACK SURGERY     CATARACT EXTRACTION Bilateral    COLONOSCOPY     CYST REMOVAL TRUNK     from back   EYE SURGERY Left 2016   laser to left eye   HIP SURGERY     bone removed from both sides of hip   KNEE ARTHROTOMY Right 12/04/2014   Procedure: KNEE ARTHROTOMY PATELLA LIGAMENT RECONSTRUSION AND REPAIR RIGHT KNEE;  Surgeon: Paralee Cancel, MD;  Location: [place];  Service: Orthopedics;  Laterality: Right;   KNEE SURGERY     X 25 TIMES   LEFT HEART CATH AND CORONARY ANGIOGRAPHY N/A 01/17/2018   Procedure: LEFT HEART CATH AND CORONARY ANGIOGRAPHY;  Surgeon: Belva Crome, MD;  Location: [facility];  Service: Cardiovascular;  Laterality: N/A;   LUMBAR DISC SURGERY     Left L3, L4, L5 discecotomy with decompression of L4 root   TONSILLECTOMY     TOTAL KNEE ARTHROPLASTY  2003   LEFT   TOTAL KNEE ARTHROPLASTY Right 03/23/2014   Procedure: RIGHT TOTAL KNEE ARTHROPLASTY AND REMOVAL RIGHT TIBIAL  DEEP IMPLANT STAPLE;  Surgeon: Paralee Cancel, MD;  Location: [facility];  Service: Orthopedics;  Laterality: Right;   TOTAL KNEE REVISION  2005   LEFT   WRIST SURGERY      FAMILY HISTORY: Family History  Problem Relation Age of Onset   Hypertension Father    Heart attack Father    Kidney Stones Father    Seizures Grandchild    Narcolepsy Grandchild  Diabetes Sister     SOCIAL HISTORY: Social History   Socioeconomic History   Marital status: Married    Spouse name: Information systems manager   Number of children: 2   Years of education: college   Highest education level: Not on file  Occupational History   Occupation: Disabled    Fish farm manager: UNEMPLOYED  Social Designer, fashion/clothing strain: Not on file   Food insecurity:    Worry: Not on file    Inability: Not on file   Transportation needs:    Medical: Not on file    Non-medical: Not on file  Tobacco Use   Smoking status: Former Smoker    Types: Cigarettes    Start  date: 06/19/1967    Last attempt to quit: 01/03/1995    Years since quitting: 23.4   Smokeless tobacco: Never Used  Substance and Sexual Activity   Alcohol use: No    Alcohol/week: 0.0 standard drinks    Comment: quit drinking in 07/86   Drug use: No   Sexual activity: Yes    Partners: Female  Lifestyle   Physical activity:    Days per week: Not on file    Minutes per session: Not on file   Stress: Not on file  Relationships   Social connections:    Talks on phone: Not on file    Gets together: Not on file    Attends religious service: Not on file    Active member of club or organization: Not on file    Attends meetings of clubs or organizations: Not on file    Relationship status: Not on file   Intimate partner violence:    Fear of current or ex partner: Not on file    Emotionally abused: Not on file    Physically abused: Not on file    Forced sexual activity: Not on file  Other Topics Concern   Not on file  Social History Narrative    68 year old, right-handed, caucasian male with a past medical history of obesity, hypertension, hyperlipidemia, diabetes, obstructive sleep apnea, presenting with frequent nighttime awakenings, excessive daytime sleepiness, also transient confusional episodes.RLS and one beosity, OSA on CPAP with AHI of 3.2 and  setting of 16 cm water , [facility] .      PHYSICAL EXAM Generalized: Well developed, in no acute distress   Neurological examination  Mentation: Alert oriented to time, place, history taking. Follows all commands speech and language fluent Cranial nerve II-XII:Extraocular movements were full. Facial symmetry noted. uvula tongue midline. Head turning and shoulder shrug  were normal and symmetric. Motor: Good strength throughout subjectively per patient Sensory: Sensory testing is intact to soft touch on all 4 extremities subjectively per patient Coordination: Cerebellar testing reveals good finger-nose-finger Reflexes:  UTA  DIAGNOSTIC DATA (LABS, IMAGING, TESTING) - I reviewed patient records, labs, notes, testing and imaging myself where available.  Lab Results  Component Value Date   WBC 8.3 01/14/2018   HGB 12.6 (L) 01/14/2018   HCT 40.8 01/14/2018   MCV 92.7 01/14/2018   PLT 282 01/14/2018      Component Value Date/Time   NA 137 05/13/2018 0925   NA 141 10/08/2013 1032   K 4.3 05/13/2018 0925   CL 105 05/13/2018 0925   CO2 26 05/13/2018 0925   GLUCOSE 174 (H) 05/13/2018 0925   BUN 31 (H) 05/13/2018 0925   BUN 28 (H) 10/08/2013 1032   CREATININE 1.25 05/13/2018 0925   CALCIUM 9.5 05/13/2018 0925  PROT 6.3 05/13/2018 0925   PROT 6.7 08/19/2015 0914   ALBUMIN 4.5 09/05/2016 0834   ALBUMIN 4.3 08/19/2015 0914   AST 16 05/13/2018 0925   ALT 18 05/13/2018 0925   ALKPHOS 40 09/05/2016 0834   BILITOT 0.3 05/13/2018 0925   BILITOT 0.3 08/19/2015 0914   GFRNONAA 60 05/13/2018 0925   GFRAA 69 05/13/2018 0925   Lab Results  Component Value Date   CHOL 152 01/14/2018   HDL 49 01/14/2018   LDLCALC 83 01/14/2018   TRIG 107 01/14/2018   CHOLHDL 3.1 01/14/2018   Lab Results  Component Value Date   HGBA1C 9.6 (H) 05/13/2018   No results found for: VITAMINB12 Lab Results  Component Value Date   TSH 0.99 10/08/2017      ASSESSMENT AND PLAN 68 y.o. year old male  has a past medical history of Arthritis, Asthma, Colon polyp, Coronary atherosclerosis of native coronary artery, DVT (deep venous thrombosis) ([place]) (2005), Essential hypertension, benign, Headache, History of transfusion, Hypersomnia, Hypothyroidism, Mixed hyperlipidemia, Morbid obesity ([place]), MRSA (methicillin resistant staph aureus) culture positive, Narcolepsy, OSA (obstructive sleep apnea), Pneumonia, Psoriasis, Pulmonary embolism ([place]) (2004), RLS (restless legs syndrome), Rotator cuff disorder, Septic arthritis of knee, left ([place]), Skin cancer, basal cell, and Type 2 diabetes mellitus ([place]). here with :  1.  Restless leg  syndrome 2.  Obstructive sleep apnea on CPAP  The patient CPAP download shows suboptimal compliance.  An order has been placed for mask refitting.  In regards to his restless legs we will start Neupro patch 1 mg daily.  He will discontinue Requip extended release 4 mg tablet.  He can continue taking Requip immediate release 2 mg twice a day as needed.  I have reviewed potential side effects of Neupro with the patient.  I have advised that if his symptoms worsen or he develops new symptoms he should let us know.  I consulted with Dr. Brett Fairy and she agrees with this plan.   I spent 25 minutes with the patient this time was spent discussing medication options for restless leg and potential side effects.   Ward Givens, MSN, NP-C 06/04/2018, 9:48 AM [facility] [address], [place], [place] [address] [phone]

## 2018-06-05 NOTE — Progress Notes (Signed)
Received from [facility] with [facility] that received CPAP order in epic.

## 2018-06-09 ENCOUNTER — Other Ambulatory Visit: Payer: Self-pay | Admitting: "Endocrinology

## 2018-06-12 ENCOUNTER — Telehealth: Payer: Self-pay | Admitting: *Deleted

## 2018-06-12 NOTE — Telephone Encounter (Signed)
I called and was not able to LM.  Scheduling 3 month follow up from 06-04-18.

## 2018-06-17 ENCOUNTER — Encounter: Payer: Self-pay | Admitting: *Deleted

## 2018-06-17 NOTE — Telephone Encounter (Signed)
Sent mychart email re: to RV appt.

## 2018-06-19 ENCOUNTER — Telehealth: Payer: Self-pay

## 2018-06-19 DIAGNOSIS — E1159 Type 2 diabetes mellitus with other circulatory complications: Secondary | ICD-10-CM

## 2018-06-19 MED ORDER — TRESIBA FLEXTOUCH 100 UNIT/ML ~~LOC~~ SOPN: 92.0000 [IU] | PEN_INJECTOR | Freq: Every day | SUBCUTANEOUS | 2 refills | Status: DC

## 2018-06-19 NOTE — Telephone Encounter (Signed)
LeighAnn Terasa Orsini, CMA  

## 2018-06-20 ENCOUNTER — Other Ambulatory Visit: Payer: Self-pay | Admitting: Cardiology

## 2018-06-20 ENCOUNTER — Telehealth: Payer: Self-pay

## 2018-06-20 DIAGNOSIS — E1159 Type 2 diabetes mellitus with other circulatory complications: Secondary | ICD-10-CM

## 2018-06-20 MED ORDER — TRESIBA FLEXTOUCH 100 UNIT/ML ~~LOC~~ SOPN: 92.0000 [IU] | PEN_INJECTOR | Freq: Every day | SUBCUTANEOUS | 2 refills | Status: DC

## 2018-06-20 MED ORDER — ISOSORBIDE MONONITRATE ER 60 MG PO TB24: ORAL_TABLET | ORAL | 3 refills | Status: DC

## 2018-06-20 NOTE — Telephone Encounter (Signed)
LeighAnn Gedalia Mcmillon, CMA  

## 2018-06-20 NOTE — Telephone Encounter (Signed)
Refilled Imdur

## 2018-06-25 ENCOUNTER — Encounter: Payer: Self-pay | Admitting: Adult Health

## 2018-07-01 DIAGNOSIS — N401 Enlarged prostate with lower urinary tract symptoms: Secondary | ICD-10-CM | POA: Diagnosis not present

## 2018-07-01 DIAGNOSIS — R3912 Poor urinary stream: Secondary | ICD-10-CM | POA: Diagnosis not present

## 2018-07-01 DIAGNOSIS — N5201 Erectile dysfunction due to arterial insufficiency: Secondary | ICD-10-CM | POA: Diagnosis not present

## 2018-07-14 ENCOUNTER — Other Ambulatory Visit: Payer: Self-pay | Admitting: Cardiology

## 2018-08-04 ENCOUNTER — Other Ambulatory Visit: Payer: Self-pay

## 2018-08-05 MED ORDER — PRAVASTATIN SODIUM 20 MG PO TABS: 20.0000 mg | ORAL_TABLET | Freq: Every evening | ORAL | 3 refills | Status: DC

## 2018-08-05 MED ORDER — LEVOTHYROXINE SODIUM 137 MCG PO TABS: 137.0000 ug | ORAL_TABLET | Freq: Every day | ORAL | 0 refills | Status: DC

## 2018-08-20 ENCOUNTER — Other Ambulatory Visit: Payer: Self-pay | Admitting: "Endocrinology

## 2018-08-22 ENCOUNTER — Other Ambulatory Visit: Payer: Self-pay | Admitting: "Endocrinology

## 2018-08-28 DIAGNOSIS — Z6836 Body mass index (BMI) 36.0-36.9, adult: Secondary | ICD-10-CM | POA: Diagnosis not present

## 2018-08-28 DIAGNOSIS — N182 Chronic kidney disease, stage 2 (mild): Secondary | ICD-10-CM | POA: Diagnosis not present

## 2018-08-28 DIAGNOSIS — R42 Dizziness and giddiness: Secondary | ICD-10-CM | POA: Diagnosis not present

## 2018-08-28 DIAGNOSIS — Z1159 Encounter for screening for other viral diseases: Secondary | ICD-10-CM | POA: Diagnosis not present

## 2018-08-28 DIAGNOSIS — I129 Hypertensive chronic kidney disease with stage 1 through stage 4 chronic kidney disease, or unspecified chronic kidney disease: Secondary | ICD-10-CM | POA: Diagnosis not present

## 2018-08-28 DIAGNOSIS — E1122 Type 2 diabetes mellitus with diabetic chronic kidney disease: Secondary | ICD-10-CM | POA: Diagnosis not present

## 2018-08-28 DIAGNOSIS — E1129 Type 2 diabetes mellitus with other diabetic kidney complication: Secondary | ICD-10-CM | POA: Diagnosis not present

## 2018-08-29 ENCOUNTER — Other Ambulatory Visit: Payer: Self-pay

## 2018-08-29 DIAGNOSIS — G2581 Restless legs syndrome: Secondary | ICD-10-CM

## 2018-08-29 NOTE — Telephone Encounter (Signed)
Received a fax from [facility] where the patient is requesting a refill on his Requip 4 mg. Per your last office note patient should stop taking the 4mg  and start taking 2 mg immediate release twice. [facility] will need a new script for this. Please advise

## 2018-08-29 NOTE — Telephone Encounter (Signed)
Please call and see if the patient stopped requip 4 mg and started neupro patch

## 2018-08-31 DIAGNOSIS — M7989 Other specified soft tissue disorders: Secondary | ICD-10-CM | POA: Diagnosis not present

## 2018-08-31 DIAGNOSIS — L0390 Cellulitis, unspecified: Secondary | ICD-10-CM | POA: Diagnosis not present

## 2018-09-02 MED ORDER — ROPINIROLE HCL 4 MG PO TABS: 2.0000 mg | ORAL_TABLET | Freq: Two times a day (BID) | ORAL | 3 refills | Status: DC

## 2018-09-02 NOTE — Telephone Encounter (Signed)
Patient mentioned that he started the neupro patch but he can't afford $700 a month for the medication. He stated that while he was using the patch that it worked. He has since started taking Requip 4 mg.

## 2018-09-02 NOTE — Telephone Encounter (Signed)
Requip sent in

## 2018-09-02 NOTE — Addendum Note (Signed)
Addended by: Trudie Buckler on: 09/02/2018 10:01 AM   Modules accepted: Orders

## 2018-09-11 ENCOUNTER — Ambulatory Visit (INDEPENDENT_AMBULATORY_CARE_PROVIDER_SITE_OTHER): Payer: Medicare HMO | Admitting: Family Medicine

## 2018-09-11 ENCOUNTER — Other Ambulatory Visit: Payer: Self-pay

## 2018-09-11 VITALS — BP 132/82 | Temp 98.5°F | Ht 65.0 in | Wt 230.8 lb

## 2018-09-11 DIAGNOSIS — Z23 Encounter for immunization: Secondary | ICD-10-CM

## 2018-09-11 DIAGNOSIS — R0602 Shortness of breath: Secondary | ICD-10-CM | POA: Diagnosis not present

## 2018-09-11 DIAGNOSIS — Z125 Encounter for screening for malignant neoplasm of prostate: Secondary | ICD-10-CM | POA: Diagnosis not present

## 2018-09-11 DIAGNOSIS — I10 Essential (primary) hypertension: Secondary | ICD-10-CM | POA: Diagnosis not present

## 2018-09-11 DIAGNOSIS — E782 Mixed hyperlipidemia: Secondary | ICD-10-CM

## 2018-09-11 DIAGNOSIS — R600 Localized edema: Secondary | ICD-10-CM

## 2018-09-11 DIAGNOSIS — Z87891 Personal history of nicotine dependence: Secondary | ICD-10-CM

## 2018-09-11 NOTE — Progress Notes (Signed)
   Subjective:  Patient arrives after multiyear hiatus with complicated concerns, multiple in nature  Patient ID: Paul Baker, male    DOB: [birth year], 68 y.o.   MRN: [id number]  HPI  Patient arrives with bilateral leg swelling for 3 weeks. Patient states his right leg is worse than his left leg.  Notes more sob with exertion, taking trash out etc. Oxygen sat 91 after exertion, back to 98 with rest. Some chest discomfort with exertion, sharp at times.  Had cath back in dec  Showed  Some improvement in the coronary art blockage.  Uses alb once er day  Smoked for twenty yrs, quit in 95  Pt has gone back to the gym, does the one step for thrity min or so,  Weights also   Patient does have a history of DVT.  However no pain in his calf.  No shortness of breath.  No chest pain.  States his swelling has been coming on for a long time.  Follow-up endocrinologist for his diabetes.  Patient also reports progressive shortness of breath with exertion.  Review of cardiologist note from last cardiologist encouraged him to come see them for this.  No orthopnea.  +20 to 25-year smoking history.  Stopped 20 years ago.  Minimal challenges with wheezing at this time.  Not exercising much due to his chronic orthopedic challenges.  No chest pain at this time.  Patient is on diuretics under the guidance of the cardiologist.  Of note his echocardiogram in February 2020 revealed 55 to 60% ejection fraction.  Patient had mild dilated atrium.  He has known coronary artery disease.  On nitroglycerin for this.  Patient does take medications which contribute to edema including Norvasc  Review of Systems No headache, no major weight loss or weight gain, no chest pain no back pain abdominal pain no change in bowel habits complete ROS otherwise negative     Objective:   Physical Exam  Alert and oriented, vitals reviewed and stable, NAD ENT-TM's and ext canals WNL bilat via otoscopic exam Soft  palate, tonsils and post pharynx WNL via oropharyngeal exam Neck-symmetric, no masses; thyroid nonpalpable and nontender Pulmonary-no tachypnea or accessory muscle use; Clear without wheezes via auscultation Card--no abnrml murmurs, rhythm reg and rate WNL Carotid pulses symmetric, without bruits Ankles 1+ edema bilateral.  Pulses good.  Distal sensation negative Homans sign negative calf tenderness right ankle painful with flexion and extension      Assessment & Plan:  Impression progressive leg edema.  Really symmetric in bilateral.  Likely due to element of chronic venous stasis exacerbated by Norvasc and salt intake.  Currently on diuretic under guidance of cardiologist.  We will do screening blood work to look for other sources of edema, Lasix adjustment will have to be done her and under the direction of cardiologist  2.  Progressive dyspnea.  Patient may well have true COPD.  X-rays did not reveal this.  Longstanding history of smoking although has quit for over 20 years.  Will evaluate pulmonary function test pre-and post nebulizer rationale discussed.    Further recommendations after both blood work and pulmonary function tests  Greater than 50% of this 40 minute face to face visit was spent in counseling and discussion and coordination of care regarding the above diagnosis/diagnosies

## 2018-09-12 DIAGNOSIS — N182 Chronic kidney disease, stage 2 (mild): Secondary | ICD-10-CM | POA: Diagnosis not present

## 2018-09-12 DIAGNOSIS — I129 Hypertensive chronic kidney disease with stage 1 through stage 4 chronic kidney disease, or unspecified chronic kidney disease: Secondary | ICD-10-CM | POA: Diagnosis not present

## 2018-09-13 LAB — BASIC METABOLIC PANEL
BUN/Creatinine Ratio: 17 (ref 10–24)
BUN: 22 mg/dL (ref 8–27)
CO2: 23 mmol/L (ref 20–29)
Calcium: 9.9 mg/dL (ref 8.6–10.2)
Chloride: 98 mmol/L (ref 96–106)
Creatinine, Ser: 1.3 mg/dL — ABNORMAL HIGH (ref 0.76–1.27)
GFR calc Af Amer: 65 mL/min/{1.73_m2} (ref >59)
GFR calc non Af Amer: 56 mL/min/{1.73_m2} — ABNORMAL LOW (ref >59)
Glucose: 346 mg/dL — ABNORMAL HIGH (ref 65–99)
Potassium: 4.8 mmol/L (ref 3.5–5.2)
Sodium: 138 mmol/L (ref 134–144)

## 2018-09-13 LAB — CBC WITH DIFFERENTIAL/PLATELET
Basophils Absolute: 0.1 10*3/uL (ref 0.0–0.2)
Basos: 1 %
EOS (ABSOLUTE): 0.3 10*3/uL (ref 0.0–0.4)
Eos: 4 %
Hematocrit: 35.6 % — ABNORMAL LOW (ref 37.5–51.0)
Hemoglobin: 11.8 g/dL — ABNORMAL LOW (ref 13.0–17.7)
Immature Grans (Abs): 0.1 10*3/uL (ref 0.0–0.1)
Immature Granulocytes: 1 %
Lymphocytes Absolute: 1.2 10*3/uL (ref 0.7–3.1)
Lymphs: 17 %
MCH: 30.1 pg (ref 26.6–33.0)
MCHC: 33.1 g/dL (ref 31.5–35.7)
MCV: 91 fL (ref 79–97)
Monocytes Absolute: 0.8 10*3/uL (ref 0.1–0.9)
Monocytes: 12 %
Neutrophils Absolute: 4.8 10*3/uL (ref 1.4–7.0)
Neutrophils: 65 %
Platelets: 199 10*3/uL (ref 150–450)
RBC: 3.92 x10E6/uL — ABNORMAL LOW (ref 4.14–5.80)
RDW: 13.8 % (ref 11.6–15.4)
WBC: 7.3 10*3/uL (ref 3.4–10.8)

## 2018-09-13 LAB — HEPATIC FUNCTION PANEL
ALT: 18 IU/L (ref 0–44)
AST: 16 IU/L (ref 0–40)
Albumin: 4.6 g/dL (ref 3.8–4.8)
Alkaline Phosphatase: 52 IU/L (ref 39–117)
Bilirubin Total: 0.3 mg/dL (ref 0.0–1.2)
Bilirubin, Direct: 0.08 mg/dL (ref 0.00–0.40)
Total Protein: 6.9 g/dL (ref 6.0–8.5)

## 2018-09-13 LAB — PSA: Prostate Specific Ag, Serum: 2.7 ng/mL (ref 0.0–4.0)

## 2018-09-13 LAB — BRAIN NATRIURETIC PEPTIDE: BNP: 23.6 pg/mL (ref 0.0–100.0)

## 2018-09-15 ENCOUNTER — Encounter: Payer: Self-pay | Admitting: Family Medicine

## 2018-09-17 ENCOUNTER — Ambulatory Visit: Payer: Medicare HMO | Admitting: "Endocrinology

## 2018-09-18 DIAGNOSIS — E1159 Type 2 diabetes mellitus with other circulatory complications: Secondary | ICD-10-CM | POA: Diagnosis not present

## 2018-09-19 LAB — COMPLETE METABOLIC PANEL WITH GFR
AG Ratio: 1.7 (calc) (ref 1.0–2.5)
ALT: 17 U/L (ref 9–46)
AST: 15 U/L (ref 10–35)
Albumin: 4.5 g/dL (ref 3.6–5.1)
Alkaline phosphatase (APISO): 52 U/L (ref 35–144)
BUN: 24 mg/dL (ref 7–25)
CO2: 28 mmol/L (ref 20–32)
Calcium: 9.7 mg/dL (ref 8.6–10.3)
Chloride: 105 mmol/L (ref 98–110)
Creat: 1.15 mg/dL (ref 0.70–1.25)
GFR, Est African American: 76 mL/min/{1.73_m2} (ref >=60)
GFR, Est Non African American: 65 mL/min/{1.73_m2} (ref >=60)
Globulin: 2.6 g/dL (calc) (ref 1.9–3.7)
Glucose, Bld: 75 mg/dL (ref 65–99)
Potassium: 4.3 mmol/L (ref 3.5–5.3)
Sodium: 142 mmol/L (ref 135–146)
Total Bilirubin: 0.3 mg/dL (ref 0.2–1.2)
Total Protein: 7.1 g/dL (ref 6.1–8.1)

## 2018-09-19 LAB — TSH: TSH: 1.38 mIU/L (ref 0.40–4.50)

## 2018-09-19 LAB — HEMOGLOBIN A1C
Hgb A1c MFr Bld: 10.2 % of total Hgb — ABNORMAL HIGH (ref <5.7)
Mean Plasma Glucose: 246 (calc)
eAG (mmol/L): 13.6 (calc)

## 2018-09-19 LAB — T4, FREE: Free T4: 1.5 ng/dL (ref 0.8–1.8)

## 2018-09-23 DIAGNOSIS — N401 Enlarged prostate with lower urinary tract symptoms: Secondary | ICD-10-CM | POA: Diagnosis not present

## 2018-09-25 ENCOUNTER — Other Ambulatory Visit: Payer: Self-pay

## 2018-09-25 ENCOUNTER — Ambulatory Visit (INDEPENDENT_AMBULATORY_CARE_PROVIDER_SITE_OTHER): Payer: Medicare HMO | Admitting: "Endocrinology

## 2018-09-25 ENCOUNTER — Encounter: Payer: Self-pay | Admitting: "Endocrinology

## 2018-09-25 DIAGNOSIS — E1159 Type 2 diabetes mellitus with other circulatory complications: Secondary | ICD-10-CM

## 2018-09-25 MED ORDER — TRESIBA FLEXTOUCH 100 UNIT/ML ~~LOC~~ SOPN: 100.0000 [IU] | PEN_INJECTOR | Freq: Every day | SUBCUTANEOUS | 2 refills | Status: DC

## 2018-09-25 MED ORDER — GLIPIZIDE ER 5 MG PO TB24: 5.0000 mg | ORAL_TABLET | Freq: Every day | ORAL | 3 refills | Status: DC

## 2018-09-25 MED ORDER — INSULIN REGULAR HUMAN 100 UNIT/ML IJ SOLN: 28.0000 [IU] | Freq: Three times a day (TID) | INTRAMUSCULAR | 2 refills | Status: DC

## 2018-09-25 NOTE — Progress Notes (Signed)
09/25/2018                                                     Endocrinology Telehealth Visit Follow up Note -During COVID -19 Pandemic  This visit type was conducted due to national recommendations for restrictions regarding the COVID-19 Pandemic  in an effort to limit this patient's exposure and mitigate transmission of the corona virus.  Due to his co-morbid illnesses, Paul Baker is at  moderate to high risk for complications without adequate follow up.  This format is felt to be most appropriate for him at this time.  I connected with this patient on 09/25/2018   by telephone and verified that I am speaking with the correct person using two identifiers. Paul Baker, 10-Jun-1950. he has verbally consented to this visit. All issues noted in this document were discussed and addressed. The format was not optimal for physical exam.     Subjective:    Patient ID: Paul Baker, male    DOB: September 20, 1950, PCP [name], MD   Past Medical History:  Diagnosis Date  . Arthritis   . Asthma   . Colon polyp   . Coronary atherosclerosis of native coronary artery    a. 2011: cath showing 90% stenosis along small non-dominant RCA (too small for PCI). b. 01/2018: cath showing nonobstructive CAD with 60 to 70% proximal to mid nondominant RCA stenosis, 50% mid LAD and 40 to 50% OM1  . DVT (deep venous thrombosis) ([place]) 2005   Right arm  . Essential hypertension, benign   . Headache   . History of transfusion   . Hypersomnia    CPAP of 16 cm, diagnosed with AHI of 60 in 2012,[place] 21- narcolepsy?  [name] Hypothyroidism   . Mixed hyperlipidemia   . Morbid obesity ([place])   . MRSA (methicillin resistant staph aureus) culture positive    08/2012  . Narcolepsy   . OSA (obstructive sleep apnea)    CPAP  . Pneumonia   . Psoriasis   . Pulmonary embolism ([place]) 2004  . RLS (restless legs syndrome)   . Rotator cuff disorder    Left  . Septic arthritis of knee, left ([place])   . Skin  cancer, basal cell   . Type 2 diabetes mellitus ([place])    Past Surgical History:  Procedure Laterality Date  . BACK SURGERY    . CATARACT EXTRACTION Bilateral   . COLONOSCOPY    . CYST REMOVAL TRUNK     from back  . EYE SURGERY Left 2016   laser to left eye  . HIP SURGERY     bone removed from both sides of hip  . KNEE ARTHROTOMY Right 12/04/2014   Procedure: KNEE ARTHROTOMY PATELLA LIGAMENT RECONSTRUSION AND REPAIR RIGHT KNEE;  Surgeon: Paralee Cancel, MD;  Location: [facility];  Service: Orthopedics;  Laterality: Right;  . KNEE SURGERY     X 25 TIMES  . LEFT HEART CATH AND CORONARY ANGIOGRAPHY N/A 01/17/2018   Procedure: LEFT HEART CATH AND CORONARY ANGIOGRAPHY;  Surgeon: Belva Crome, MD;  Location: [facility];  Service: Cardiovascular;  Laterality: N/A;  . LUMBAR DISC SURGERY     Left L3, L4, L5 discecotomy with decompression of L4 root  . TONSILLECTOMY    . TOTAL KNEE ARTHROPLASTY  2003  LEFT  . TOTAL KNEE ARTHROPLASTY Right 03/23/2014   Procedure: RIGHT TOTAL KNEE ARTHROPLASTY AND REMOVAL RIGHT TIBIAL  DEEP IMPLANT STAPLE;  Surgeon: Paralee Cancel, MD;  Location: [facility];  Service: Orthopedics;  Laterality: Right;  . TOTAL KNEE REVISION  2005   LEFT  . WRIST SURGERY     Social History   Socioeconomic History  . Marital status: Married    Spouse name: Toney Reil  . Number of children: 2  . Years of education: college  . Highest education level: Not on file  Occupational History  . Occupation: Disabled    Fish farm manager: UNEMPLOYED  Social Needs  . Financial resource strain: Not on file  . Food insecurity    Worry: Not on file    Inability: Not on file  . Transportation needs    Medical: Not on file    Non-medical: Not on file  Tobacco Use  . Smoking status: Former Smoker    Types: Cigarettes    Start date: 06/19/1967    Quit date: 01/03/1995    Years since quitting: 23.7  . Smokeless tobacco: Never Used  Substance and Sexual Activity  . Alcohol use: No    Alcohol/week:  0.0 standard drinks    Comment: quit drinking in 07/86  . Drug use: No  . Sexual activity: Yes    Partners: Female  Lifestyle  . Physical activity    Days per week: Not on file    Minutes per session: Not on file  . Stress: Not on file  Relationships  . Social Herbalist on phone: Not on file    Gets together: Not on file    Attends religious service: Not on file    Active member of club or organization: Not on file    Attends meetings of clubs or organizations: Not on file    Relationship status: Not on file  Other Topics Concern  . Not on file  Social History Narrative    68 year old, right-handed, caucasian male with a past medical history of obesity, hypertension, hyperlipidemia, diabetes, obstructive sleep apnea, presenting with frequent nighttime awakenings, excessive daytime sleepiness, also transient confusional episodes.RLS and one beosity, OSA on CPAP with AHI of 3.2 and  setting of 16 cm water , [facility] .   Outpatient Encounter Medications as of 09/25/2018  Medication Sig  . albuterol (PROVENTIL HFA;VENTOLIN HFA) 108 (90 Base) MCG/ACT inhaler Inhale 2 puffs into the lungs every 6 (six) hours as needed for wheezing or shortness of breath.  [name] amLODipine (NORVASC) 5 MG tablet Take 1 tablet (5 mg total) by mouth daily.  [name] aspirin EC 81 MG tablet Take 1 tablet (81 mg total) by mouth daily.  . B-D ULTRAFINE III SHORT PEN 31G X 8 MM MISC USING FIVE TIMES DAILY.  [name] Continuous Blood Gluc Receiver (DEXCOM G6 RECEIVER) DEVI 1 Piece by Does not apply route as needed.  . Continuous Blood Gluc Sensor (DEXCOM G6 SENSOR) MISC 4 Pieces by Does not apply route once a week.  . Continuous Blood Gluc Sensor (FREESTYLE LIBRE 14 DAY SENSOR) MISC USE ONE SENSOR AS DIRECTED EVERY 14 DAYS  . fenofibrate 160 MG tablet TAKE 1 TABLET BY MOUTH AT BEDTIME -  . furosemide (LASIX) 40 MG tablet Take 40 mg by mouth. Take 40 mg alternating with 80 mg Daily  . glipiZIDE (GLUCOTROL XL) 5 MG  24 hr tablet Take 1 tablet (5 mg total) by mouth daily with breakfast.  . insulin degludec (TRESIBA  FLEXTOUCH) 100 UNIT/ML SOPN FlexTouch Pen Inject 1 mL (100 Units total) into the skin at bedtime.  . insulin regular (NOVOLIN R) 100 units/mL injection Inject 0.28-0.34 mLs (28-34 Units total) into the skin 3 (three) times daily before meals.  . Insulin Syringe-Needle U-100 (INSULIN SYRINGE 1CC/31GX5/16") 31G X 5/16" 1 ML MISC 1 each by Does not apply route 2 (two) times daily.  . isosorbide mononitrate (IMDUR) 60 MG 24 hr tablet Take 60 mg ( 1 Tablet)  in the AM and Take 30 mg ( 1/2 Tablet)  in the PM  . levothyroxine (SYNTHROID) 137 MCG tablet Take 1 tablet (137 mcg total) by mouth daily before breakfast.  . loratadine (CLARITIN) 10 MG tablet Take 10 mg by mouth daily as needed for allergies.   [name] losartan (COZAAR) 25 MG tablet Take 25 mg by mouth daily.  . metFORMIN (GLUCOPHAGE) 500 MG tablet TAKE 1 TABLET BY MOUTH TWICE DAILY WITH MEALS  . metoprolol succinate (TOPROL-XL) 50 MG 24 hr tablet Take 1 tablet by mouth twice daily  . modafinil (PROVIGIL) 200 MG tablet Take 1 tablet (200 mg total) by mouth daily.  . nitroGLYCERIN (NITROSTAT) 0.4 MG SL tablet DISSOLVE 1 TABLET UNDER TONGUE EVERY 5 MINUTES UP TO 15 MIN FOR CHESTPAIN. IF NO RELIEF CALL 911.  [name] ONE TOUCH ULTRA TEST test strip TEST BLOOD SUGAR UP TO 4 TIMES DAILY.  [name] ONETOUCH DELICA LANCETS 99991111 MISC USE AS DIRECTED TO TEST BLOOD SUGAR 4 TIMES DAILY.  . pravastatin (PRAVACHOL) 20 MG tablet Take 1 tablet (20 mg total) by mouth every evening.  [name] rOPINIRole (REQUIP) 4 MG tablet Take 0.5 tablets (2 mg total) by mouth 2 (two) times daily.  . sodium chloride (OCEAN) 0.65 % SOLN nasal spray Place 1-2 sprays into both nostrils daily as needed for congestion.  . Vitamin D, Ergocalciferol, (DRISDOL) 1.25 MG (50000 UT) CAPS capsule Take 1 capsule by mouth once a week  . [DISCONTINUED] insulin aspart (NOVOLOG) 100 UNIT/ML injection Inject 25-31 Units into  the skin 3 (three) times daily before meals.  . [DISCONTINUED] insulin degludec (TRESIBA FLEXTOUCH) 100 UNIT/ML SOPN FlexTouch Pen Inject 0.92 mLs (92 Units total) into the skin at bedtime.   No facility-administered encounter medications on file as of 09/25/2018.    ALLERGIES: Allergies  Allergen Reactions  . Cardizem [Diltiazem Hcl]     Edema   . Cardura [Doxazosin Mesylate]     Headaches / cramps  . Lipitor [Atorvastatin] Other (See Comments)    Leg cramps  . Paxil [Paroxetine Hcl]     Unknown reaction   . Adhesive [Tape] Rash    Pulls off skin  . Codeine Rash and Other (See Comments)    Headache   . Gabapentin Rash   VACCINATION STATUS: Immunization History  Administered Date(s) Administered  . Influenza Split 09/18/2012  . Influenza,inj,Quad PF,6+ Mos 10/13/2013, 10/06/2014, 09/11/2018  . Influenza-Unspecified 12/01/2015, 11/08/2016  . Pneumococcal Polysaccharide-23 10/02/2004, 10/03/2011  . Td 05/24/2006    Diabetes He presents for his follow-up diabetic visit. He has type 2 diabetes mellitus. Onset time: Was diagnosed at approximate age of 23 years. His disease course has been worsening. There are no hypoglycemic associated symptoms. Pertinent negatives for hypoglycemia include no confusion, headaches, pallor or seizures. Associated symptoms include polydipsia and polyuria. Pertinent negatives for diabetes include no chest pain, no fatigue, no polyphagia and no weakness. There are no hypoglycemic complications. Symptoms are worsening. Diabetic complications include heart disease. Risk factors for coronary artery disease include  diabetes mellitus, dyslipidemia, male sex, obesity, sedentary lifestyle and tobacco exposure. Current diabetic treatment includes intensive insulin program. He is compliant with treatment most of the time. He is following a generally unhealthy diet. He has had a previous visit with a dietitian. He never participates in exercise. His home blood glucose  trend is fluctuating minimally. His breakfast blood glucose range is generally >200 mg/dl. His lunch blood glucose range is generally >200 mg/dl. His dinner blood glucose range is generally >200 mg/dl. His bedtime blood glucose range is generally >200 mg/dl. His overall blood glucose range is >200 mg/dl. (His previsit labs show A1c of 9.2%, unchanged from his last time.) An ACE inhibitor/angiotensin II receptor blocker is being taken. He sees a podiatrist.Eye exam is current.  Hyperlipidemia This is a chronic problem. The current episode started more than 1 year ago. The problem is controlled. Exacerbating diseases include diabetes, hypothyroidism and obesity. Pertinent negatives include no chest pain, myalgias or shortness of breath. Current antihyperlipidemic treatment includes statins. Risk factors for coronary artery disease include dyslipidemia, diabetes mellitus, hypertension, male sex, obesity and a sedentary lifestyle.  Hypertension This is a chronic problem. The current episode started more than 1 year ago. The problem is controlled. Pertinent negatives include no chest pain, headaches, neck pain, palpitations or shortness of breath. Risk factors for coronary artery disease include diabetes mellitus, dyslipidemia, male gender, obesity and sedentary lifestyle. Past treatments include angiotensin blockers. Hypertensive end-organ damage includes CAD/MI. Identifiable causes of hypertension include a thyroid problem.  Thyroid Problem Presents for follow-up visit. Patient reports no constipation, diarrhea, fatigue or palpitations. The symptoms have been improving. Past treatments include levothyroxine. His past medical history is significant for diabetes and hyperlipidemia.   Review of systems: Limited as above.   Objective:    There were no vitals taken for this visit.  Wt Readings from Last 3 Encounters:  09/11/18 230 lb 12.8 oz (104.7 kg)  05/21/18 223 lb (101.2 kg)  02/14/18 220 lb (99.8 kg)      CMP Latest Ref Rng & Units 09/18/2018 09/11/2018 05/13/2018  Glucose 65 - 99 mg/dL 75 346(H) 174(H)  BUN 7 - 25 mg/dL 24 22 31(H)  Creatinine 0.70 - 1.25 mg/dL 1.15 1.30(H) 1.25  Sodium 135 - 146 mmol/L 142 138 137  Potassium 3.5 - 5.3 mmol/L 4.3 4.8 4.3  Chloride 98 - 110 mmol/L 105 98 105  CO2 20 - 32 mmol/L 28 23 26   Calcium 8.6 - 10.3 mg/dL 9.7 9.9 9.5  Total Protein 6.1 - 8.1 g/dL 7.1 6.9 6.3  Total Bilirubin 0.2 - 1.2 mg/dL 0.3 0.3 0.3  Alkaline Phos 39 - 117 IU/L - 52 -  AST 10 - 35 U/L 15 16 16   ALT 9 - 46 U/L 17 18 18      Diabetic Labs (most recent): Lab Results  Component Value Date   HGBA1C 10.2 (H) 09/18/2018   HGBA1C 9.6 (H) 05/13/2018   HGBA1C 9.2 (H) 01/14/2018   Lipid Panel     Component Value Date/Time   CHOL 152 01/14/2018 0749   CHOL 148 08/19/2015 0914   TRIG 107 01/14/2018 0749   HDL 49 01/14/2018 0749   HDL 53 08/19/2015 0914   CHOLHDL 3.1 01/14/2018 0749   VLDL 31 11/13/2013 0843   LDLCALC 83 01/14/2018 0749      Assessment & Plan:   1. Uncontrolled type 2 diabetes mellitus with circulatory complication, with long-term current use of insulin ([place])  -His diabetes is  complicated by coronary  artery disease and patient remains at an extremely high risk for more acute and chronic complications of diabetes which include CAD, CVA, CKD, retinopathy, and neuropathy. These are all discussed in detail with the patient. -He reports significantly above target glycemic profile all across the day, more recently worsened by what appears to be cellulitis of lower extremity.  Fasting blood glucose beween 159-347, prelunch between 150-412, presupper between 298-424, bedtime between 110-473.  -His previsit labs show A1c increasing to 10.2% from 9.6%.    -He did not document no report of hypoglycemia.     Glucose logs and insulin administration records pertaining to this visit,  to be scanned into patient's records.  Recent labs reviewed.  - I have  re-counseled the patient on diet management and weight loss  by adopting a carbohydrate restricted / protein rich  Diet. - he  admits there is a room for improvement in his diet and drink choices. -  Suggestion is made for him to avoid simple carbohydrates  from his diet including Cakes, Sweet Desserts / Pastries, Ice Cream, Soda (diet and regular), Sweet Tea, Candies, Chips, Cookies, Sweet Pastries,  Store Bought Juices, Alcohol in Excess of  1-2 drinks a day, Artificial Sweeteners, Coffee Creamer, and "Sugar-free" Products. This will help patient to have stable blood glucose profile and potentially avoid unintended weight gain.  - Patient is advised to stick to a routine mealtimes to eat 3 meals  a day and avoid unnecessary snacks ( to snack only to correct hypoglycemia).   - I have approached patient with the following individualized plan to manage diabetes and patient agrees.  -He expresses concern that he will not afford Humalog /NovoLog any longer.  He is asking for cheaper alternative.  -He will continue to need intensive treatment with basal/bolus insulin at a higher dose in order for him to achieve and maintain control of diabetes to target.  -He is advised to increase Tresiba to 100 units nightly, discussed and switched his NovoLog to Regular insulin at 28  -34 units 3 times a day before breakfast, lunch, and supper for pre-meal blood glucose  above 90 mg/dL.  - He is currently using CGM, advised to continue document blood glucose at least 4 times a day-before meals and at bedtime.   -Patient is encouraged to call clinic for blood glucose levels less than 70 or above 200 mg /dl x 3. -For the last 3 visits, his renal function has been stable.  He will continue to benefit from metformin therapy, advised to continue metformin 500 mg p.o. twice daily after breakfast and supper.     2) BP/HTN: he is advised to home monitor blood pressure and report if > 140/90 on 2 separate readings.   He  is advised to continue  his current blood pressure medications including amlodipine, metoprolol.  He will be considered for low-dose lisinopril if he has microalbuminuria.   3) Lipids/HPL: His recent labs show LDL controlled at 80.  He is advised to continue pravastatin 20 mg p.o. nightly.    4)  Weight/Diet: CDE consult in progress, exercise, and carbohydrates information provided.  5) hypothyroidism -His recent thyroid function tests are consistent with appropriate replacement.  He is advised to continue levothyroxine 137 mcg p.o. every morning.    - We discussed about the correct intake of his thyroid hormone, on empty stomach at fasting, with water, separated by at least 30 minutes from breakfast and other medications,  and separated by more than 4 hours  from calcium, iron, multivitamins, acid reflux medications (PPIs). -Patient is made aware of the fact that thyroid hormone replacement is needed for life, dose to be adjusted by periodic monitoring of thyroid function tests.   6) Chronic Care/Health Maintenance:  -Patient is on ACEI/ARB and Statin medications and encouraged to continue to follow up with Ophthalmology, Podiatrist at least yearly or according to recommendations, and advised to  stay away from smoking. I have recommended yearly flu vaccine and pneumonia vaccination at least every 5 years; moderate intensity exercise for up to 150 minutes weekly; and  sleep for at least 7 hours a day.  - I advised patient to maintain close follow up with [name], MD for primary care needs.  - Patient Care Time Today:  25 min, of which >50% was spent in  counseling and the rest reviewing his  current and  previous labs/studies, previous treatments, his blood glucose readings, and medications' doses and developing a plan for long-term care based on the latest recommendations for standards of care.   Paul Baker participated in the discussions, expressed understanding, and voiced  agreement with the above plans.  All questions were answered to his satisfaction. he is encouraged to contact clinic should he have any questions or concerns prior to his return visit.   Follow up plan: -Return in about 2 weeks (around 10/09/2018) for Follow up with Meter and Logs Only - no Labs.  Glade Lloyd, MD Phone: [phone]  Fax: [phone]  This note was partially dictated with voice recognition software. Similar sounding words can be transcribed inadequately or may not  be corrected upon review.  09/25/2018, 4:45 PM

## 2018-09-26 ENCOUNTER — Telehealth: Payer: Self-pay

## 2018-09-26 ENCOUNTER — Other Ambulatory Visit: Payer: Self-pay | Admitting: "Endocrinology

## 2018-09-26 DIAGNOSIS — E1159 Type 2 diabetes mellitus with other circulatory complications: Secondary | ICD-10-CM

## 2018-09-26 MED ORDER — METFORMIN HCL 500 MG PO TABS: 500.0000 mg | ORAL_TABLET | Freq: Two times a day (BID) | ORAL | 0 refills | Status: DC

## 2018-09-26 NOTE — Telephone Encounter (Signed)
LeighAnn Emily Massar, CMA  

## 2018-09-30 DIAGNOSIS — R3912 Poor urinary stream: Secondary | ICD-10-CM | POA: Diagnosis not present

## 2018-09-30 DIAGNOSIS — N401 Enlarged prostate with lower urinary tract symptoms: Secondary | ICD-10-CM | POA: Diagnosis not present

## 2018-10-02 ENCOUNTER — Other Ambulatory Visit: Payer: Self-pay | Admitting: Neurology

## 2018-10-10 ENCOUNTER — Encounter: Payer: Self-pay | Admitting: "Endocrinology

## 2018-10-10 ENCOUNTER — Ambulatory Visit (INDEPENDENT_AMBULATORY_CARE_PROVIDER_SITE_OTHER): Payer: Medicare HMO | Admitting: "Endocrinology

## 2018-10-10 ENCOUNTER — Other Ambulatory Visit: Payer: Self-pay

## 2018-10-10 DIAGNOSIS — E038 Other specified hypothyroidism: Secondary | ICD-10-CM

## 2018-10-10 DIAGNOSIS — E1159 Type 2 diabetes mellitus with other circulatory complications: Secondary | ICD-10-CM

## 2018-10-10 DIAGNOSIS — E782 Mixed hyperlipidemia: Secondary | ICD-10-CM

## 2018-10-10 DIAGNOSIS — I10 Essential (primary) hypertension: Secondary | ICD-10-CM

## 2018-10-10 MED ORDER — INSULIN REGULAR HUMAN 100 UNIT/ML IJ SOLN: 30.0000 [IU] | Freq: Three times a day (TID) | INTRAMUSCULAR | 2 refills | Status: DC

## 2018-10-10 NOTE — Progress Notes (Signed)
10/10/2018                                                     Endocrinology Telehealth Visit Follow up Note -During COVID -19 Pandemic  This visit type was conducted due to national recommendations for restrictions regarding the COVID-19 Pandemic  in an effort to limit this patient's exposure and mitigate transmission of the corona virus.  Due to his co-morbid illnesses, Paul Baker is at  moderate to high risk for complications without adequate follow up.  This format is felt to be most appropriate for him at this time.  I connected with this patient on 10/10/2018   by telephone and verified that I am speaking with the correct person using two identifiers. Paul Baker, 11-10-1950. he has verbally consented to this visit. All issues noted in this document were discussed and addressed. The format was not optimal for physical exam.    Subjective:    Patient ID: Paul Baker, male    DOB: [birth year], PCP [name], MD   Past Medical History:  Diagnosis Date  . Arthritis   . Asthma   . Colon polyp   . Coronary atherosclerosis of native coronary artery    a. 2011: cath showing 90% stenosis along small non-dominant RCA (too small for PCI). b. 01/2018: cath showing nonobstructive CAD with 60 to 70% proximal to mid nondominant RCA stenosis, 50% mid LAD and 40 to 50% OM1  . DVT (deep venous thrombosis) ([place]) 2005   Right arm  . Essential hypertension, benign   . Headache   . History of transfusion   . Hypersomnia    CPAP of 16 cm, diagnosed with AHI of 60 in 2012,[place] 21- narcolepsy?  [name] Hypothyroidism   . Mixed hyperlipidemia   . Morbid obesity ([place])   . MRSA (methicillin resistant staph aureus) culture positive    08/2012  . Narcolepsy   . OSA (obstructive sleep apnea)    CPAP  . Pneumonia   . Psoriasis   . Pulmonary embolism ([place]) 2004  . RLS (restless legs syndrome)   . Rotator cuff disorder    Left  . Septic arthritis of knee, left ([place])   . Skin  cancer, basal cell   . Type 2 diabetes mellitus ([place])    Past Surgical History:  Procedure Laterality Date  . BACK SURGERY    . CATARACT EXTRACTION Bilateral   . COLONOSCOPY    . CYST REMOVAL TRUNK     from back  . EYE SURGERY Left 2016   laser to left eye  . HIP SURGERY     bone removed from both sides of hip  . KNEE ARTHROTOMY Right 12/04/2014   Procedure: KNEE ARTHROTOMY PATELLA LIGAMENT RECONSTRUSION AND REPAIR RIGHT KNEE;  Surgeon: Paralee Cancel, MD;  Location: [facility];  Service: Orthopedics;  Laterality: Right;  . KNEE SURGERY     X 25 TIMES  . LEFT HEART CATH AND CORONARY ANGIOGRAPHY N/A 01/17/2018   Procedure: LEFT HEART CATH AND CORONARY ANGIOGRAPHY;  Surgeon: Belva Crome, MD;  Location: [facility];  Service: Cardiovascular;  Laterality: N/A;  . LUMBAR DISC SURGERY     Left L3, L4, L5 discecotomy with decompression of L4 root  . TONSILLECTOMY    . TOTAL KNEE ARTHROPLASTY  2003  LEFT  . TOTAL KNEE ARTHROPLASTY Right 03/23/2014   Procedure: RIGHT TOTAL KNEE ARTHROPLASTY AND REMOVAL RIGHT TIBIAL  DEEP IMPLANT STAPLE;  Surgeon: Paralee Cancel, MD;  Location: [facility];  Service: Orthopedics;  Laterality: Right;  . TOTAL KNEE REVISION  2005   LEFT  . WRIST SURGERY     Social History   Socioeconomic History  . Marital status: Married    Spouse name: Toney Reil  . Number of children: 2  . Years of education: college  . Highest education level: Not on file  Occupational History  . Occupation: Disabled    Fish farm manager: UNEMPLOYED  Social Needs  . Financial resource strain: Not on file  . Food insecurity    Worry: Not on file    Inability: Not on file  . Transportation needs    Medical: Not on file    Non-medical: Not on file  Tobacco Use  . Smoking status: Former Smoker    Types: Cigarettes    Start date: 06/19/1967    Quit date: 01/03/1995    Years since quitting: 23.7  . Smokeless tobacco: Never Used  Substance and Sexual Activity  . Alcohol use: No    Alcohol/week:  0.0 standard drinks    Comment: quit drinking in 07/86  . Drug use: No  . Sexual activity: Yes    Partners: Female  Lifestyle  . Physical activity    Days per week: Not on file    Minutes per session: Not on file  . Stress: Not on file  Relationships  . Social Herbalist on phone: Not on file    Gets together: Not on file    Attends religious service: Not on file    Active member of club or organization: Not on file    Attends meetings of clubs or organizations: Not on file    Relationship status: Not on file  Other Topics Concern  . Not on file  Social History Narrative    68 year old, right-handed, caucasian male with a past medical history of obesity, hypertension, hyperlipidemia, diabetes, obstructive sleep apnea, presenting with frequent nighttime awakenings, excessive daytime sleepiness, also transient confusional episodes.RLS and one beosity, OSA on CPAP with AHI of 3.2 and  setting of 16 cm water , [facility] .   Outpatient Encounter Medications as of 10/10/2018  Medication Sig  . albuterol (PROVENTIL HFA;VENTOLIN HFA) 108 (90 Base) MCG/ACT inhaler Inhale 2 puffs into the lungs every 6 (six) hours as needed for wheezing or shortness of breath.  [name] amLODipine (NORVASC) 5 MG tablet Take 1 tablet (5 mg total) by mouth daily.  [name] aspirin EC 81 MG tablet Take 1 tablet (81 mg total) by mouth daily.  . B-D ULTRAFINE III SHORT PEN 31G X 8 MM MISC USING FIVE TIMES DAILY.  [name] Continuous Blood Gluc Receiver (DEXCOM G6 RECEIVER) DEVI 1 Piece by Does not apply route as needed.  . Continuous Blood Gluc Sensor (DEXCOM G6 SENSOR) MISC 4 Pieces by Does not apply route once a week.  . Continuous Blood Gluc Sensor (FREESTYLE LIBRE 14 DAY SENSOR) MISC USE ONE SENSOR AS DIRECTED EVERY 14 DAYS  . fenofibrate 160 MG tablet TAKE 1 TABLET BY MOUTH AT BEDTIME -  . furosemide (LASIX) 40 MG tablet Take 40 mg by mouth. Take 40 mg alternating with 80 mg Daily  . glipiZIDE (GLUCOTROL XL) 5 MG  24 hr tablet Take 1 tablet (5 mg total) by mouth daily with breakfast.  . insulin degludec (TRESIBA  FLEXTOUCH) 100 UNIT/ML SOPN FlexTouch Pen Inject 1 mL (100 Units total) into the skin at bedtime.  . insulin regular (NOVOLIN R) 100 units/mL injection Inject 0.3-0.36 mLs (30-36 Units total) into the skin 3 (three) times daily before meals.  . Insulin Syringe-Needle U-100 (INSULIN SYRINGE 1CC/31GX5/16") 31G X 5/16" 1 ML MISC 1 each by Does not apply route 2 (two) times daily.  . isosorbide mononitrate (IMDUR) 60 MG 24 hr tablet Take 60 mg ( 1 Tablet)  in the AM and Take 30 mg ( 1/2 Tablet)  in the PM  . levothyroxine (SYNTHROID) 137 MCG tablet Take 1 tablet (137 mcg total) by mouth daily before breakfast.  . loratadine (CLARITIN) 10 MG tablet Take 10 mg by mouth daily as needed for allergies.   [name] losartan (COZAAR) 25 MG tablet Take 25 mg by mouth daily.  . metFORMIN (GLUCOPHAGE) 500 MG tablet TAKE 1 TABLET BY MOUTH TWICE DAILY WITH MEALS  . metFORMIN (GLUCOPHAGE) 500 MG tablet Take 1 tablet (500 mg total) by mouth 2 (two) times daily with a meal.  . metoprolol succinate (TOPROL-XL) 50 MG 24 hr tablet Take 1 tablet by mouth twice daily  . modafinil (PROVIGIL) 200 MG tablet Take 1 tablet by mouth once daily  . nitroGLYCERIN (NITROSTAT) 0.4 MG SL tablet DISSOLVE 1 TABLET UNDER TONGUE EVERY 5 MINUTES UP TO 15 MIN FOR CHESTPAIN. IF NO RELIEF CALL 911.  [name] ONE TOUCH ULTRA TEST test strip TEST BLOOD SUGAR UP TO 4 TIMES DAILY.  [name] ONETOUCH DELICA LANCETS 99991111 MISC USE AS DIRECTED TO TEST BLOOD SUGAR 4 TIMES DAILY.  . pravastatin (PRAVACHOL) 20 MG tablet Take 1 tablet (20 mg total) by mouth every evening.  [name] rOPINIRole (REQUIP) 4 MG tablet Take 0.5 tablets (2 mg total) by mouth 2 (two) times daily.  . sodium chloride (OCEAN) 0.65 % SOLN nasal spray Place 1-2 sprays into both nostrils daily as needed for congestion.  . Vitamin D, Ergocalciferol, (DRISDOL) 1.25 MG (50000 UT) CAPS capsule Take 1 capsule by mouth  once a week  . [DISCONTINUED] insulin regular (NOVOLIN R) 100 units/mL injection Inject 0.28-0.34 mLs (28-34 Units total) into the skin 3 (three) times daily before meals.   No facility-administered encounter medications on file as of 10/10/2018.    ALLERGIES: Allergies  Allergen Reactions  . Cardizem [Diltiazem Hcl]     Edema   . Cardura [Doxazosin Mesylate]     Headaches / cramps  . Lipitor [Atorvastatin] Other (See Comments)    Leg cramps  . Paxil [Paroxetine Hcl]     Unknown reaction   . Adhesive [Tape] Rash    Pulls off skin  . Codeine Rash and Other (See Comments)    Headache   . Gabapentin Rash   VACCINATION STATUS: Immunization History  Administered Date(s) Administered  . Influenza Split 09/18/2012  . Influenza,inj,Quad PF,6+ Mos 10/13/2013, 10/06/2014, 09/11/2018  . Influenza-Unspecified 12/01/2015, 11/08/2016  . Pneumococcal Polysaccharide-23 10/02/2004, 10/03/2011  . Td 05/24/2006    Diabetes He presents for his follow-up diabetic visit. He has type 2 diabetes mellitus. Onset time: Was diagnosed at approximate age of 63 years. His disease course has been improving. There are no hypoglycemic associated symptoms. Pertinent negatives for hypoglycemia include no confusion, headaches, pallor or seizures. Pertinent negatives for diabetes include no chest pain, no fatigue, no polydipsia, no polyphagia, no polyuria and no weakness. There are no hypoglycemic complications. Symptoms are improving. Diabetic complications include heart disease. Risk factors for coronary artery disease include diabetes  mellitus, dyslipidemia, male sex, obesity, sedentary lifestyle and tobacco exposure. Current diabetic treatment includes intensive insulin program. He is compliant with treatment most of the time. He is following a generally unhealthy diet. He has had a previous visit with a dietitian. He never participates in exercise. His home blood glucose trend is decreasing steadily. His breakfast  blood glucose range is generally >200 mg/dl. His lunch blood glucose range is generally >200 mg/dl. His dinner blood glucose range is generally >200 mg/dl. His bedtime blood glucose range is generally >200 mg/dl. His overall blood glucose range is >200 mg/dl. (He reports improving glycemic profile since last visit, fasting between 91-277, prelunch between 91-289, presupper between 107-330, bedtime between 141-388 . his previsit labs show A1c of 9.2%, unchanged from his last time.) An ACE inhibitor/angiotensin II receptor blocker is being taken. He sees a podiatrist.Eye exam is current.  Hyperlipidemia This is a chronic problem. The current episode started more than 1 year ago. The problem is controlled. Exacerbating diseases include diabetes, hypothyroidism and obesity. Pertinent negatives include no chest pain, myalgias or shortness of breath. Current antihyperlipidemic treatment includes statins. Risk factors for coronary artery disease include dyslipidemia, diabetes mellitus, hypertension, male sex, obesity and a sedentary lifestyle.  Hypertension This is a chronic problem. The current episode started more than 1 year ago. The problem is controlled. Pertinent negatives include no chest pain, headaches, neck pain, palpitations or shortness of breath. Risk factors for coronary artery disease include diabetes mellitus, dyslipidemia, male gender, obesity and sedentary lifestyle. Past treatments include angiotensin blockers. Hypertensive end-organ damage includes CAD/MI. Identifiable causes of hypertension include a thyroid problem.  Thyroid Problem Presents for follow-up visit. Patient reports no constipation, diarrhea, fatigue or palpitations. The symptoms have been improving. Past treatments include levothyroxine. His past medical history is significant for diabetes and hyperlipidemia.   Review of systems: Limited as above.   Objective:    There were no vitals taken for this visit.  Wt Readings from  Last 3 Encounters:  09/11/18 230 lb 12.8 oz (104.7 kg)  05/21/18 223 lb (101.2 kg)  02/14/18 220 lb (99.8 kg)     CMP Latest Ref Rng & Units 09/18/2018 09/11/2018 05/13/2018  Glucose 65 - 99 mg/dL 75 346(H) 174(H)  BUN 7 - 25 mg/dL 24 22 31(H)  Creatinine 0.70 - 1.25 mg/dL 1.15 1.30(H) 1.25  Sodium 135 - 146 mmol/L 142 138 137  Potassium 3.5 - 5.3 mmol/L 4.3 4.8 4.3  Chloride 98 - 110 mmol/L 105 98 105  CO2 20 - 32 mmol/L 28 23 26   Calcium 8.6 - 10.3 mg/dL 9.7 9.9 9.5  Total Protein 6.1 - 8.1 g/dL 7.1 6.9 6.3  Total Bilirubin 0.2 - 1.2 mg/dL 0.3 0.3 0.3  Alkaline Phos 39 - 117 IU/L - 52 -  AST 10 - 35 U/L 15 16 16   ALT 9 - 46 U/L 17 18 18      Diabetic Labs (most recent): Lab Results  Component Value Date   HGBA1C 10.2 (H) 09/18/2018   HGBA1C 9.6 (H) 05/13/2018   HGBA1C 9.2 (H) 01/14/2018   Lipid Panel     Component Value Date/Time   CHOL 152 01/14/2018 0749   CHOL 148 08/19/2015 0914   TRIG 107 01/14/2018 0749   HDL 49 01/14/2018 0749   HDL 53 08/19/2015 0914   CHOLHDL 3.1 01/14/2018 0749   VLDL 31 11/13/2013 0843   LDLCALC 83 01/14/2018 0749      Assessment & Plan:   1. Uncontrolled type 2  diabetes mellitus with circulatory complication, with long-term current use of insulin (HCC)  -His diabetes is  complicated by coronary artery disease and patient remains at an extremely high risk for more acute and chronic complications of diabetes which include CAD, CVA, CKD, retinopathy, and neuropathy. These are all discussed in detail with the patient. -He reports improving glycemic profile on his current regimen of intensive treatment with basal/bolus insulin.    -His previsit labs show A1c increasing to 10.2% from 9.6%.    -He did not document no report of hypoglycemia.     Glucose logs and insulin administration records pertaining to this visit,  to be scanned into patient's records.  Recent labs reviewed.  - I have re-counseled the patient on diet management and  weight loss  by adopting a carbohydrate restricted / protein rich  Diet.  - he  admits there is a room for improvement in his diet and drink choices. -  Suggestion is made for him to avoid simple carbohydrates  from his diet including Cakes, Sweet Desserts / Pastries, Ice Cream, Soda (diet and regular), Sweet Tea, Candies, Chips, Cookies, Sweet Pastries,  Store Bought Juices, Alcohol in Excess of  1-2 drinks a day, Artificial Sweeteners, Coffee Creamer, and "Sugar-free" Products. This will help patient to have stable blood glucose profile and potentially avoid unintended weight gain.   - Patient is advised to stick to a routine mealtimes to eat 3 meals  a day and avoid unnecessary snacks ( to snack only to correct hypoglycemia).   - I have approached patient with the following individualized plan to manage diabetes and patient agrees.  -He will be switched to regular insulin to replace his Humalog/NovoLog, responding with reasonable glycemic improvement.    -He will continue to need intensive treatment with basal/bolus insulin at a higher dose in order for him to achieve and maintain control of diabetes to target.  -He is advised to continue Tresiba 100 units nightly, discussed and increase his regular insulin to 30-36 units 3 times a day before breakfast, lunch, and supper for pre-meal blood glucose  above 90 mg/dL.  - He is currently using CGM, advised to continue document blood glucose at least 4 times a day-before meals and at bedtime.   -Patient is encouraged to call clinic for blood glucose levels less than 70 or above 200 mg /dl x 3. -If he continues to require greater than 200 units of insulin U100 by next visit, he will be switched to insulin Humulin  U500.  -For the last 3 visits, his renal function has been stable.  He will continue to benefit from metformin therapy, advised to continue metformin 500 mg p.o. twice daily-daily after breakfast and supper.   2) BP/HTN: he is advised  to home monitor blood pressure and report if > 140/90 on 2 separate readings.   He is advised to continue  his current blood pressure medications including amlodipine, metoprolol.  He will be considered for low-dose lisinopril if he has microalbuminuria.   3) Lipids/HPL: His recent labs show LDL controlled at 80.  He is advised to continue pravastatin 20 mg p.o. nightly.    4)  Weight/Diet: CDE consult in progress, exercise, and carbohydrates information provided.  5) hypothyroidism -His recent thyroid function tests are consistent with appropriate replacement.  He is advised to continue  levothyroxine 137 mcg p.o. every morning.    - We discussed about the correct intake of his thyroid hormone, on empty stomach at fasting, with water,  separated by at least 30 minutes from breakfast and other medications,  and separated by more than 4 hours from calcium, iron, multivitamins, acid reflux medications (PPIs). -Patient is made aware of the fact that thyroid hormone replacement is needed for life, dose to be adjusted by periodic monitoring of thyroid function tests.   6) Chronic Care/Health Maintenance:  -Patient is on ACEI/ARB and Statin medications and encouraged to continue to follow up with Ophthalmology, Podiatrist at least yearly or according to recommendations, and advised to  stay away from smoking. I have recommended yearly flu vaccine and pneumonia vaccination at least every 5 years; moderate intensity exercise for up to 150 minutes weekly; and  sleep for at least 7 hours a day.  - I advised patient to maintain close follow up with [name], MD for primary care needs.  - Patient Care Time Today:  25 min, of which >50% was spent in  counseling and the rest reviewing his  current and  previous labs/studies, previous treatments, his blood glucose readings, and medications' doses and developing a plan for long-term care based on the latest recommendations for standards of care.    Paul Baker participated in the discussions, expressed understanding, and voiced agreement with the above plans.  All questions were answered to his satisfaction. he is encouraged to contact clinic should he have any questions or concerns prior to his return visit.   Follow up plan: -Return in about 3 months (around 01/10/2019) for Bring Meter and Logs- A1c in Office, Include 8 log sheets.  Glade Lloyd, MD Phone: [phone]  Fax: [phone]  This note was partially dictated with voice recognition software. Similar sounding words can be transcribed inadequately or may not  be corrected upon review.  10/10/2018, 5:48 PM

## 2018-10-11 DIAGNOSIS — E1165 Type 2 diabetes mellitus with hyperglycemia: Secondary | ICD-10-CM | POA: Diagnosis not present

## 2018-10-21 ENCOUNTER — Encounter: Payer: Self-pay | Admitting: Family Medicine

## 2018-10-21 ENCOUNTER — Ambulatory Visit (INDEPENDENT_AMBULATORY_CARE_PROVIDER_SITE_OTHER): Payer: Medicare HMO | Admitting: Family Medicine

## 2018-10-21 ENCOUNTER — Telehealth: Payer: Self-pay | Admitting: *Deleted

## 2018-10-21 DIAGNOSIS — J4521 Mild intermittent asthma with (acute) exacerbation: Secondary | ICD-10-CM | POA: Diagnosis not present

## 2018-10-21 MED ORDER — CEFPROZIL 500 MG PO TABS: 500.0000 mg | ORAL_TABLET | Freq: Two times a day (BID) | ORAL | 0 refills | Status: DC

## 2018-10-21 MED ORDER — PREDNISONE 10 MG PO TABS: ORAL_TABLET | ORAL | 0 refills | Status: DC

## 2018-10-21 NOTE — Progress Notes (Signed)
   Subjective:  Audio only  Patient ID: Paul Baker, male    DOB: 1950/03/09, 68 y.o.   MRN: [id number]  Cough This is a new problem. Episode onset: started a few days ago. Associated symptoms include nasal congestion and wheezing. He has tried OTC cough suppressant (inhaler) for the symptoms.      Review of Systems  Respiratory: Positive for cough and wheezing.    Virtual Visit via Video Note  I connected with Paul Baker on 10/21/18 at  3:00 PM EDT by a video enabled telemedicine application and verified that I am speaking with the correct person using two identifiers.  Location: Patient: home Provider: office   I discussed the limitations of evaluation and management by telemedicine and the availability of in person appointments. The patient expressed understanding and agreed to proceed.  History of Present Illness:    Observations/Objective:   Assessment and Plan:   Follow Up Instructions:    I discussed the assessment and treatment plan with the patient. The patient was provided an opportunity to ask questions and all were answered. The patient agreed with the plan and demonstrated an understanding of the instructions.   The patient was advised to call back or seek an in-person evaluation if the symptoms worsen or if the condition fails to improve as anticipated.  I provided 18 minutes of non-face-to-face time during this encounter.  Patient notes intermittent cough.  Productive in nature.  History of probable COPD.  No fever.  No chills.  No headache.  No sore throat.  No body aches.  No known exposure to anyone else sick.  States he had similar spells like this each fall.     Objective:   Physical Exam   Virtual     Assessment & Plan:  Impression exacerbation of COPD/asthma.  Still await pulmonary function test to ascertain how much of an element of COPD and patient really has.  Will give a limited prednisone taper.  Proper albuterol use  discussed.  Antibiotics prescribed symptom care discussed.  If worsen or starts developing other features significant with infection and call back.

## 2018-10-21 NOTE — Telephone Encounter (Signed)
Dr Richardson Landry ordered pre and post pulmonary function test on patient 09/11/18 at [facility]. The order is till active in [place]. The patient states he has not been called by respiratory to have this scheduled . Patient is currently sick and it would need to be scheduled at least 2 weeks out (patient started steroids today). Dr Richardson Landry wanted to call and check on this. Left message to return call with respiratory at [facility].

## 2018-10-23 NOTE — Telephone Encounter (Signed)
Left message to return call with respiratory at [facility]

## 2018-10-29 NOTE — Telephone Encounter (Signed)
Pulmonary function test scheduled at [facility] 11/21/2018 at 3pm. Patient needs to have Covid test done at [facility] 11/18/2018. Patient notified and verbalized understanding.

## 2018-11-06 ENCOUNTER — Other Ambulatory Visit: Payer: Self-pay | Admitting: "Endocrinology

## 2018-11-07 ENCOUNTER — Encounter: Payer: Self-pay | Admitting: Family Medicine

## 2018-11-07 DIAGNOSIS — H02831 Dermatochalasis of right upper eyelid: Secondary | ICD-10-CM | POA: Diagnosis not present

## 2018-11-07 DIAGNOSIS — H02834 Dermatochalasis of left upper eyelid: Secondary | ICD-10-CM | POA: Diagnosis not present

## 2018-11-07 DIAGNOSIS — Z961 Presence of intraocular lens: Secondary | ICD-10-CM | POA: Diagnosis not present

## 2018-11-07 DIAGNOSIS — E119 Type 2 diabetes mellitus without complications: Secondary | ICD-10-CM | POA: Diagnosis not present

## 2018-11-07 LAB — HM DIABETES EYE EXAM

## 2018-11-11 DIAGNOSIS — E1165 Type 2 diabetes mellitus with hyperglycemia: Secondary | ICD-10-CM | POA: Diagnosis not present

## 2018-11-19 ENCOUNTER — Other Ambulatory Visit: Payer: Self-pay | Admitting: Neurology

## 2018-11-19 ENCOUNTER — Other Ambulatory Visit: Payer: Self-pay

## 2018-11-19 ENCOUNTER — Other Ambulatory Visit (HOSPITAL_COMMUNITY)
Admission: RE | Admit: 2018-11-19 | Discharge: 2018-11-19 | Disposition: A | Discharge status: Home / Self Care | Payer: Medicare HMO | Source: Ambulatory Visit | Attending: Family Medicine | Admitting: Family Medicine

## 2018-11-19 DIAGNOSIS — Z20828 Contact with and (suspected) exposure to other viral communicable diseases: Secondary | ICD-10-CM | POA: Insufficient documentation

## 2018-11-19 LAB — SARS CORONAVIRUS 2 (TAT 6-24 HRS): SARS Coronavirus 2: NEGATIVE

## 2018-11-21 ENCOUNTER — Other Ambulatory Visit: Payer: Self-pay

## 2018-11-21 ENCOUNTER — Ambulatory Visit (HOSPITAL_COMMUNITY)
Admission: RE | Admit: 2018-11-21 | Discharge: 2018-11-21 | Disposition: A | Discharge status: Home / Self Care | Payer: Medicare HMO | Source: Ambulatory Visit | Attending: Family Medicine | Admitting: Family Medicine

## 2018-11-21 DIAGNOSIS — Z87891 Personal history of nicotine dependence: Secondary | ICD-10-CM | POA: Diagnosis present

## 2018-11-21 DIAGNOSIS — R0602 Shortness of breath: Secondary | ICD-10-CM | POA: Insufficient documentation

## 2018-11-21 MED ORDER — ALBUTEROL SULFATE (2.5 MG/3ML) 0.083% IN NEBU
2.5000 mg | INHALATION_SOLUTION | Freq: Once | RESPIRATORY_TRACT | Status: AC
  Administered 2018-11-21: 2.5 mg via RESPIRATORY_TRACT

## 2018-11-25 ENCOUNTER — Telehealth (INDEPENDENT_AMBULATORY_CARE_PROVIDER_SITE_OTHER): Payer: Self-pay | Admitting: Neurology

## 2018-11-25 ENCOUNTER — Telehealth: Payer: Self-pay | Admitting: Family Medicine

## 2018-11-25 ENCOUNTER — Encounter: Payer: Self-pay | Admitting: Adult Health

## 2018-11-25 ENCOUNTER — Other Ambulatory Visit: Payer: Self-pay | Admitting: Neurology

## 2018-11-25 MED ORDER — ROPINIROLE HCL ER 4 MG PO TB24: 4.0000 mg | ORAL_TABLET | Freq: Every day | ORAL | 3 refills | Status: DC

## 2018-11-25 NOTE — Telephone Encounter (Signed)
Received a call from the [facility] because the patient was reaching out to have his 4mg  ER medication refilled. The patient has been on this previously but the last thing I see documented was in June there was mychart medication and patient was to stop the ER dose and could still use the IR requip taking 2 mg IR BID as needed. The pharmacist will have the patient contact our office to determine what the patient should be taking. After hanging up with the patient I did come across another note pn Aug 31st where the patient informed the CMA that the neupro patch was too expensive and that he has restarted the 4 mg Requip.  At that time Ropinirole 4 mg was sent into the pharmacy for the patient to take 1/2 tab BID which was what he was already taking in addition to the Neuro patch.   From 10/18/17-06/04/2018 patient was taking Ropinerole 4 mg XL it was ordered 1 at bedtime.   At his June 2nd apt it was reported the patient was taking the 4mg  XL at bedtime and then he also was taking 4 mg IR breaking in half and taking 1/2 tab (2mg ) in am and 1/2 tab (2mg ) at bedtime.   I think the patient thought that since he stopped the neupro patch he would go back to previous regimen he was on but it doesn't appear that was documented that way. I will send this note to [facility] , NP that saw him last to have her review chart and so that way we will know what the patient should in fact be taking and if we need to send medication in we can at that time.

## 2018-11-25 NOTE — Telephone Encounter (Addendum)
I called the patient.  He states that since June he has been taking ropinirole extended release 4 mg daily and Requip 4 mg immediate release half a tablet at 3 PM and then another half a tablet at bedtime.  He reports that he has been tolerating the dose well and it controls his symptoms.  Extended release dose was called in.

## 2018-11-25 NOTE — Addendum Note (Signed)
Addended by: Trudie Buckler on: 11/25/2018 05:05 PM   Modules accepted: Orders, Level of Service

## 2018-11-25 NOTE — Telephone Encounter (Signed)
Contacted pt to inquire about CT scan due to no CT scan being in system. Pt is wanting results of Pulmonary Function Test. Please advise. Thank you

## 2018-11-25 NOTE — Telephone Encounter (Signed)
Patient wanting his results of CT scan.

## 2018-11-26 ENCOUNTER — Encounter: Payer: Self-pay | Admitting: Family Medicine

## 2018-11-26 NOTE — Addendum Note (Signed)
Addended by: Vicente Males on: 11/26/2018 09:15 AM   Modules accepted: Orders

## 2018-11-26 NOTE — Telephone Encounter (Signed)
Pt contacted and verbalized understanding.  

## 2018-11-26 NOTE — Telephone Encounter (Signed)
See results

## 2018-11-26 NOTE — Addendum Note (Signed)
Addended by: Trudie Buckler on: 11/26/2018 10:31 AM   Modules accepted: Level of Service

## 2018-12-09 ENCOUNTER — Encounter: Payer: Self-pay | Admitting: Family Medicine

## 2018-12-09 DIAGNOSIS — Z20828 Contact with and (suspected) exposure to other viral communicable diseases: Secondary | ICD-10-CM

## 2018-12-09 DIAGNOSIS — Z20822 Contact with and (suspected) exposure to covid-19: Secondary | ICD-10-CM

## 2018-12-09 LAB — PULMONARY FUNCTION TEST
DL/VA % pred: 97 %
DL/VA: 4.09 ml/min/mmHg/L
DLCO unc % pred: 70 %
DLCO unc: 15.9 ml/min/mmHg
FEF 25-75 Post: 2.89 L/sec
FEF 25-75 Pre: 2.07 L/sec
FEF2575-%Change-Post: 39 %
FEF2575-%Pred-Post: 132 %
FEF2575-%Pred-Pre: 95 %
FEV1-%Change-Post: 7 %
FEV1-%Pred-Post: 79 %
FEV1-%Pred-Pre: 74 %
FEV1-Post: 2.19 L
FEV1-Pre: 2.05 L
FEV1FVC-%Change-Post: 1 %
FEV1FVC-%Pred-Pre: 108 %
FEV6-%Change-Post: 5 %
FEV6-%Pred-Post: 76 %
FEV6-%Pred-Pre: 72 %
FEV6-Post: 2.68 L
FEV6-Pre: 2.53 L
FEV6FVC-%Pred-Post: 106 %
FEV6FVC-%Pred-Pre: 106 %
FVC-%Change-Post: 5 %
FVC-%Pred-Post: 72 %
FVC-%Pred-Pre: 68 %
FVC-Post: 2.68 L
FVC-Pre: 2.53 L
Post FEV1/FVC ratio: 82 %
Post FEV6/FVC ratio: 100 %
Pre FEV1/FVC ratio: 81 %
Pre FEV6/FVC Ratio: 100 %
RV % pred: 81 %
RV: 1.72 L
TLC % pred: 74 %
TLC: 4.48 L

## 2018-12-09 NOTE — Telephone Encounter (Signed)
Paul Kirschner, MD  You 22 minutes ago (1:29 PM)   I'm not opposed, lets order igm and igg covid antibody

## 2018-12-10 DIAGNOSIS — Z20828 Contact with and (suspected) exposure to other viral communicable diseases: Secondary | ICD-10-CM | POA: Diagnosis not present

## 2018-12-12 ENCOUNTER — Encounter: Payer: Self-pay | Admitting: Family Medicine

## 2018-12-12 DIAGNOSIS — R059 Cough, unspecified: Secondary | ICD-10-CM

## 2018-12-12 DIAGNOSIS — R05 Cough: Secondary | ICD-10-CM

## 2018-12-12 DIAGNOSIS — Z8709 Personal history of other diseases of the respiratory system: Secondary | ICD-10-CM

## 2018-12-12 LAB — SARS-COV-2 ANTIBODY, IGM: SARS-CoV-2 Antibody, IgM: NEGATIVE

## 2018-12-12 LAB — SAR COV2 SEROLOGY (COVID19)AB(IGG),IA: DiaSorin SARS-CoV-2 Ab, IgG: NEGATIVE

## 2018-12-12 MED ORDER — ALBUTEROL SULFATE HFA 108 (90 BASE) MCG/ACT IN AERS: 2.0000 | INHALATION_SPRAY | Freq: Four times a day (QID) | RESPIRATORY_TRACT | 5 refills | Status: DC | PRN

## 2018-12-16 ENCOUNTER — Encounter: Payer: Self-pay | Admitting: Pulmonary Disease

## 2018-12-16 ENCOUNTER — Ambulatory Visit: Payer: Medicare HMO | Admitting: Pulmonary Disease

## 2018-12-16 ENCOUNTER — Other Ambulatory Visit: Payer: Self-pay

## 2018-12-16 VITALS — BP 142/80 | HR 81 | Temp 97.9°F | Ht 64.17 in | Wt 231.6 lb

## 2018-12-16 DIAGNOSIS — E1165 Type 2 diabetes mellitus with hyperglycemia: Secondary | ICD-10-CM | POA: Diagnosis not present

## 2018-12-16 DIAGNOSIS — J849 Interstitial pulmonary disease, unspecified: Secondary | ICD-10-CM | POA: Diagnosis not present

## 2018-12-16 DIAGNOSIS — J4550 Severe persistent asthma, uncomplicated: Secondary | ICD-10-CM

## 2018-12-16 MED ORDER — ARNUITY ELLIPTA 200 MCG/ACT IN AEPB: 1.0000 | INHALATION_SPRAY | Freq: Every day | RESPIRATORY_TRACT | 5 refills | Status: DC

## 2018-12-16 NOTE — Patient Instructions (Signed)
Will schedule high resolution CT chest  Arnuity 1 puff daily, and rinse mouth after each use  Ventolin (albuterol) two puffs every 6 hours as needed for cough, wheeze, chest congestion, or shortness of breath  Follow up in 3 weeks in [employer]

## 2018-12-16 NOTE — Progress Notes (Signed)
[facility]  Chief Complaint  Patient presents with   Consult    Patient is here for shortness of breath all the time and is worse with exertion since January. Patient has history of asthma.    Constitutional:  BP (!) 142/80 (BP Location: Right Arm, Patient Position: Sitting, Cuff Size: Normal)    Pulse 81    Temp 97.9 F (36.6 C) (Temporal)    Ht 5' 4.17" (1.63 m)    Wt 231 lb 9.6 oz (105.1 kg)    SpO2 96% Comment: on RA   BMI 39.54 kg/m   Past Medical History:  Rt subclavian vein stenosis, Fatty liver, Spinal stenosis, OSA, Non obstructive CAD, Colon polyps, DVT Rt arm, HA, HTN, Hypothyroidism, HLD, PNA, PE, Psoriasis, RLS, Septic arthritis Lt knee, DM  Brief Summary:  Paul Baker is a 68 y.o. male former smoker with dyspnea.  He was seen previously by Dr. Annamaria Boots with pulmonary.  Was told he has asthma.  Wasn't much of an issue and wasn't on maintenance therapy.  He was always very active and worked in [employer].  Never had any trouble with his breathing until he developed a respiratory infection in January 2020.  He was tested negative for the flu, and told he had a really bad infection but no diagnosis was determined.  He subsequently tested negative for COVID 19, but this testing was done months later.  Since January 2020, he gets winded with minimal activity.  He gets short of breath taking out his trash.  He is cough more and gets wheeze in his chest.  He brings up clear sputum.  Had one episode of blood mixed in sputum, but hasn't occurred recently.  Tried prednisone, and helped marginally.  Has been using albuterol, and this helps.    He had leg and lung clots years ago.  Was previously on coumadin, but since transitioned to aspirin.    He was born in [place], grew up in [place], and moved to [place] about 30 years ago.  He had pneumonia about 15 years ago.  No history of TB.  No animal/bird exposures.  No history of food or  medical allergies.  Denies seasonal allergies.  Has history of psoriasis, but never told he had joint involvement.  CXR 01/15/18 (reviewed by me) - scarring Lt base.  Physical Exam:   Appearance - well kempt   ENMT - clear nasal mucosa, midline nasal  septum, no oral exudates, no LAN, trachea midline  Respiratory - normal chest wall, normal respiratory effort, no accessory muscle use, no wheeze/rales  CV - s1s2 regular rate and rhythm, no murmurs, no peripheral edema, radial pulses symmetric  GI - soft, non tender, no masses  Lymph - no adenopathy noted in neck and axillary areas  MSK - normal gait  Ext - no cyanosis, clubbing, or joint inflammation noted  Skin - no rashes, lesions, or ulcers  Neuro - normal strength, oriented x 3  Psych - normal mood and affect  Discussion:  He has prior history of asthma.  He had an undefined respiratory illness in January 2020.  Since then he has noticed persistent dyspnea on exertion, productive cough and wheeze.  His PFT from September 2020 showed bronchodilator response from change in FEF 25-75, but also showed mild restriction and diffusion defect.    Assessment/Plan:   Dyspnea, cough, wheezing. - likely from progression of asthma - will have him try arnuity - continue prn albuterol  Restriction and diffusion defect on recent PFT. - CXR from January 2020 showed scarring - will arrange for high resolution CT chest to further assess for ILD  Non obstructive CAD. - followed by Dr. Domenic Polite with cardiology  History of thromboembolic disease. - if about assessment is unrevealing, then might need V/Q scan  Obstructive sleep apnea, RLS. - followed at [facility]   Patient Instructions  Will schedule high resolution CT chest  Arnuity 1 puff daily, and rinse mouth after each use  Ventolin (albuterol) two puffs every 6 hours as needed for cough, wheeze, chest congestion, or shortness of breath  Follow up in 3 weeks in  [place], MD [place] Pager: [phone] 12/16/2018, 11:40 AM  Flow Sheet     Pulmonary tests:  Spirometry 10/17/11 >> 2.58 (89%), FEV1% 85 PFT 09/11/18 >> FEV1 2.19 (79%), FEV1% 82, TLC 4.48 (74%), DLCO 70%, +BD from change in FEF 25-75%  Sleep tests:  PSG 2012 >> AHI 60  Cardiac tests:  LHC 01/17/18 >> non obstructive CAD Echo 02/21/18 >> EF 55 to 60%  Review of Systems:  Reviewed and negative.  Medications:   Allergies as of 12/16/2018      Reactions   Cardizem [diltiazem Hcl]    Edema    Cardura [doxazosin Mesylate]    Headaches / cramps   Lipitor [atorvastatin] Other (See Comments)   Leg cramps   Paxil [paroxetine Hcl]    Unknown reaction    Adhesive [tape] Rash   Pulls off skin   Codeine Rash, Other (See Comments)   Headache    Gabapentin Rash      Medication List       Accurate as of December 16, 2018 11:40 AM. If you have any questions, ask your nurse or doctor.        STOP taking these medications   cefPROZIL 500 MG tablet Commonly known as: CEFZIL Stopped by: Chesley Mires, MD   predniSONE 10 MG tablet Commonly known as: DELTASONE Stopped by: Chesley Mires, MD     TAKE these medications   albuterol 108 (90 Base) MCG/ACT inhaler Commonly known as: VENTOLIN HFA Inhale 2 puffs into the lungs every 6 (six) hours as needed for wheezing or shortness of breath.   amLODipine 5 MG tablet Commonly known as: NORVASC Take 1 tablet (5 mg total) by mouth daily.   Arnuity Ellipta 200 MCG/ACT Aepb Generic drug: Fluticasone Furoate Inhale 1 puff into the lungs daily. Started by: Chesley Mires, MD   aspirin EC 81 MG tablet Take 1 tablet (81 mg total) by mouth daily.   B-D ULTRAFINE III SHORT PEN 31G X 8 MM Misc Generic drug: Insulin Pen Needle USING FIVE TIMES DAILY.   Dexcom G6 Receiver [place] 1 Piece by Does not apply route as needed.   Dexcom G6 Sensor Misc 4 Pieces by Does not apply route once a  week. What changed: Another medication with the same name was removed. Continue taking this medication, and follow the directions you see here. Changed by: Chesley Mires, MD   Euthyrox 137 MCG tablet Generic drug: levothyroxine TAKE 1 TABLET BY MOUTH ONCE DAILY BEFORE BREAKFAST   fenofibrate 160 MG tablet TAKE 1 TABLET BY MOUTH AT BEDTIME -   furosemide 40 MG tablet Commonly known as: LASIX Take 40 mg by mouth. Take 40 mg alternating with 80 mg Daily   glipiZIDE 5 MG 24 hr tablet Commonly known as: GLUCOTROL XL Take 1 tablet (5  mg total) by mouth daily with breakfast.   insulin regular 100 units/mL injection Commonly known as: NovoLIN R Inject 0.3-0.36 mLs (30-36 Units total) into the skin 3 (three) times daily before meals.   INSULIN SYRINGE 1CC/31GX5/16" 31G X 5/16" 1 ML Misc 1 each by Does not apply route 2 (two) times daily.   isosorbide mononitrate 60 MG 24 hr tablet Commonly known as: IMDUR Take 60 mg ( 1 Tablet)  in the AM and Take 30 mg ( 1/2 Tablet)  in the PM   loratadine 10 MG tablet Commonly known as: CLARITIN Take 10 mg by mouth daily as needed for allergies.   losartan 25 MG tablet Commonly known as: COZAAR Take 25 mg by mouth daily.   metFORMIN 500 MG tablet Commonly known as: GLUCOPHAGE TAKE 1 TABLET BY MOUTH TWICE DAILY WITH MEALS What changed: Another medication with the same name was removed. Continue taking this medication, and follow the directions you see here. Changed by: Chesley Mires, MD   metoprolol succinate 50 MG 24 hr tablet Commonly known as: TOPROL-XL Take 1 tablet by mouth twice daily   modafinil 200 MG tablet Commonly known as: PROVIGIL Take 1 tablet by mouth once daily   nitroGLYCERIN 0.4 MG SL tablet Commonly known as: NITROSTAT DISSOLVE 1 TABLET UNDER TONGUE EVERY 5 MINUTES UP TO 15 MIN FOR CHESTPAIN. IF NO RELIEF CALL 911.   ONE TOUCH ULTRA TEST test strip Generic drug: glucose blood TEST BLOOD SUGAR UP TO 4 TIMES DAILY.     OneTouch Delica Lancets 99991111 Misc USE AS DIRECTED TO TEST BLOOD SUGAR 4 TIMES DAILY.   pravastatin 20 MG tablet Commonly known as: PRAVACHOL Take 1 tablet (20 mg total) by mouth every evening.   rOPINIRole 4 MG tablet Commonly known as: REQUIP Take 0.5 tablets (2 mg total) by mouth 2 (two) times daily.   rOPINIRole 4 MG 24 hr tablet Commonly known as: REQUIP XL Take 1 tablet (4 mg total) by mouth at bedtime.   sodium chloride 0.65 % Soln nasal spray Commonly known as: OCEAN Place 1-2 sprays into both nostrils daily as needed for congestion.   Tyler Aas FlexTouch 100 UNIT/ML Sopn FlexTouch Pen Generic drug: insulin degludec Inject 1 mL (100 Units total) into the skin at bedtime.   Vitamin D (Ergocalciferol) 1.25 MG (50000 UT) Caps capsule Commonly known as: DRISDOL Take 1 capsule by mouth once a week       Past Surgical History:  He  has a past surgical history that includes Total knee arthroplasty (2003); Lumbar disc surgery; Wrist surgery; Total knee revision (2005); Colonoscopy; Back surgery; Knee surgery; Total knee arthroplasty (Right, 03/23/2014); Tonsillectomy; Hip surgery; Eye surgery (Left, 2016); Cataract extraction (Bilateral); Knee arthrotomy (Right, 12/04/2014); Cyst removal trunk; and LEFT HEART CATH AND CORONARY ANGIOGRAPHY (N/A, 01/17/2018).  Family History:  His family history includes Diabetes in his sister; Heart attack in his father; Hypertension in his father; Kidney Stones in his father; Narcolepsy in his grandchild; Seizures in his grandchild.  Social History:  He  reports that he quit smoking about 23 years ago. His smoking use included cigarettes. He started smoking about 51 years ago. He has never used smokeless tobacco. He reports that he does not drink alcohol or use drugs.

## 2018-12-16 NOTE — Progress Notes (Signed)
Review of Systems  Respiratory: Positive for cough, shortness of breath and wheezing.   Cardiovascular: Positive for chest pain and leg swelling.  Gastrointestinal: Positive for abdominal pain and heartburn.  Musculoskeletal: Positive for back pain and joint pain.  Neurological: Positive for dizziness, tingling, weakness and headaches.

## 2018-12-19 ENCOUNTER — Other Ambulatory Visit: Payer: Self-pay

## 2018-12-19 ENCOUNTER — Ambulatory Visit (HOSPITAL_COMMUNITY)
Admission: RE | Admit: 2018-12-19 | Discharge: 2018-12-19 | Disposition: A | Discharge status: Home / Self Care | Payer: Medicare HMO | Source: Ambulatory Visit | Attending: Pulmonary Disease | Admitting: Pulmonary Disease

## 2018-12-19 DIAGNOSIS — J849 Interstitial pulmonary disease, unspecified: Secondary | ICD-10-CM | POA: Diagnosis not present

## 2018-12-19 DIAGNOSIS — I700 Atherosclerosis of aorta: Secondary | ICD-10-CM | POA: Diagnosis not present

## 2018-12-19 DIAGNOSIS — J45909 Unspecified asthma, uncomplicated: Secondary | ICD-10-CM | POA: Diagnosis not present

## 2018-12-26 ENCOUNTER — Telehealth: Payer: Self-pay | Admitting: Pulmonary Disease

## 2018-12-26 NOTE — Telephone Encounter (Signed)
HRCT chest 12/19/18 >> atherosclerosis, patchy air trapping, fatty liver   Please let him know his CT scan showed changes consistent with asthma.  No other worrisome findings.  Will discuss in more detail at next visit in January 2021.

## 2018-12-26 NOTE — Telephone Encounter (Signed)
Left detailed message per DPR to advise the patient of the results noted below and to call back if there are any questions. Nothing further needed at this time.

## 2019-01-09 ENCOUNTER — Other Ambulatory Visit: Payer: Self-pay

## 2019-01-09 MED ORDER — GLIPIZIDE ER 5 MG PO TB24: 5.0000 mg | ORAL_TABLET | Freq: Every day | ORAL | 1 refills | Status: DC

## 2019-01-10 ENCOUNTER — Ambulatory Visit: Payer: Medicare HMO | Admitting: "Endocrinology

## 2019-01-13 ENCOUNTER — Ambulatory Visit: Payer: Medicare HMO | Admitting: Pulmonary Disease

## 2019-01-13 ENCOUNTER — Other Ambulatory Visit: Payer: Self-pay

## 2019-01-13 ENCOUNTER — Encounter: Payer: Self-pay | Admitting: Pulmonary Disease

## 2019-01-13 VITALS — BP 128/66 | HR 76 | Temp 97.7°F | Ht 65.0 in | Wt 230.2 lb

## 2019-01-13 DIAGNOSIS — J4550 Severe persistent asthma, uncomplicated: Secondary | ICD-10-CM

## 2019-01-13 MED ORDER — BREO ELLIPTA 200-25 MCG/INH IN AEPB: 1.0000 | INHALATION_SPRAY | Freq: Every day | RESPIRATORY_TRACT | 5 refills | Status: DC

## 2019-01-13 NOTE — Progress Notes (Signed)
[facility]  Chief Complaint  Patient presents with  . Follow-up    SOB, cough, wheezing, tightness in chest, headache    Constitutional:  BP 128/66 (BP Location: Right Arm, Cuff Size: Normal)   Pulse 76   Temp 97.7 F (36.5 C) (Temporal)   Ht 5\' 5"  (1.651 m)   Wt 230 lb 3.2 oz (104.4 kg)   SpO2 99% Comment: RA  BMI 38.31 kg/m   Past Medical History:  Rt subclavian vein stenosis, Fatty liver, Spinal stenosis, OSA, Non obstructive CAD, Colon polyps, DVT Rt arm, HA, HTN, Hypothyroidism, HLD, PNA, PE, Psoriasis, RLS, Septic arthritis Lt knee, DM  Brief Summary:  Paul Baker is a 69 y.o. male former smoker with asthma.  He had CT chest in December.  No ILD (reviewed by me with him).  Arnuity has helped.  Still has cough and wheeze.  Gets symptoms at night.  Using albuterol daily.  Has noticed blood sugar elevation since being on arnuity.  Respiratory Exam:   ENMT - no sinus tenderness, no nasal discharge, no oral exudate  Respiratory - normal appearance of chest wall, normal respiratory effort w/o accessory muscle use, no dullness on percussion, no wheezing or rales  CV - s1s2 regular rate and rhythm, no murmurs, no peripheral edema, radial pulses symmetric  Ext - no cyanosis, clubbing, or joint inflammation noted     Assessment/Plan:   Severe, persistent asthma. - unstable at present - will change from arnuity to breo 200 - continue prn albuterol - if symptoms persist, then consider adding singulair  DM type II poorly controlled. - he reports increase in blood sugar since being on arnuity - he will f/u with his PCP to discuss further management of his DM  Restriction and diffusion defect on recent PFT. - no evidence for ILD on recent CT chest - PFT findings likely related to obesity - discussed importance of weight loss  Non obstructive CAD. - followed by Dr. Domenic Polite with cardiology  Obstructive sleep apnea,  RLS. - followed at [facility]   Patient Instructions  Stop arnuity  Breo one puff daily, and rinse mouth after each [place]  Follow up in 4 months    Chesley Mires, MD [place] Pager: [phone] 01/13/2019, 9:34 AM  Flow Sheet     Pulmonary tests:  Spirometry 10/17/11 >> 2.58 (89%), FEV1% 85 PFT 09/11/18 >> FEV1 2.19 (79%), FEV1% 82, TLC 4.48 (74%), DLCO 70%, +BD from change in FEF 25-75%  Chest imaging:  HRCT chest 12/19/18 >> atherosclerosis, patchy air trapping, fatty liver  Sleep tests:  PSG 2012 >> AHI 60  Cardiac tests:  LHC 01/17/18 >> non obstructive CAD Echo 02/21/18 >> EF 55 to 60%  Medications:   Allergies as of 01/13/2019      Reactions   Cardizem [diltiazem Hcl]    Edema    Cardura [doxazosin Mesylate]    Headaches / cramps   Lipitor [atorvastatin] Other (See Comments)   Leg cramps   Paxil [paroxetine Hcl]    Unknown reaction    Adhesive [tape] Rash   Pulls off skin   Codeine Rash, Other (See Comments)   Headache    Gabapentin Rash      Medication List       Accurate as of January 13, 2019  9:34 AM. If you have any questions, ask your nurse or doctor.        STOP taking these medications   Arnuity Ellipta  200 MCG/ACT Aepb Generic drug: Fluticasone Furoate Stopped by: Chesley Mires, MD     TAKE these medications   albuterol 108 (90 Base) MCG/ACT inhaler Commonly known as: VENTOLIN HFA Inhale 2 puffs into the lungs every 6 (six) hours as needed for wheezing or shortness of breath.   amLODipine 5 MG tablet Commonly known as: NORVASC Take 1 tablet (5 mg total) by mouth daily.   aspirin EC 81 MG tablet Take 1 tablet (81 mg total) by mouth daily.   B-D ULTRAFINE III SHORT PEN 31G X 8 MM Misc Generic drug: Insulin Pen Needle USING FIVE TIMES DAILY.   Breo Ellipta 200-25 MCG/INH Aepb Generic drug: fluticasone furoate-vilanterol Inhale 1 puff into the lungs daily. Started by: Chesley Mires, MD   Dexcom G6  Receiver [place] 1 Piece by Does not apply route as needed.   Dexcom G6 Sensor Misc 4 Pieces by Does not apply route once a week.   Euthyrox 137 MCG tablet Generic drug: levothyroxine TAKE 1 TABLET BY MOUTH ONCE DAILY BEFORE BREAKFAST   fenofibrate 160 MG tablet TAKE 1 TABLET BY MOUTH AT BEDTIME -   furosemide 40 MG tablet Commonly known as: LASIX Take 40 mg by mouth. Take 40 mg alternating with 80 mg Daily   glipiZIDE 5 MG 24 hr tablet Commonly known as: GLUCOTROL XL Take 1 tablet (5 mg total) by mouth daily with breakfast.   insulin regular 100 units/mL injection Commonly known as: NovoLIN R Inject 0.3-0.36 mLs (30-36 Units total) into the skin 3 (three) times daily before meals.   INSULIN SYRINGE 1CC/31GX5/16" 31G X 5/16" 1 ML Misc 1 each by Does not apply route 2 (two) times daily.   isosorbide mononitrate 60 MG 24 hr tablet Commonly known as: IMDUR Take 60 mg ( 1 Tablet)  in the AM and Take 30 mg ( 1/2 Tablet)  in the PM   loratadine 10 MG tablet Commonly known as: CLARITIN Take 10 mg by mouth daily as needed for allergies.   losartan 25 MG tablet Commonly known as: COZAAR Take 25 mg by mouth daily.   metFORMIN 500 MG tablet Commonly known as: GLUCOPHAGE TAKE 1 TABLET BY MOUTH TWICE DAILY WITH MEALS   metoprolol succinate 50 MG 24 hr tablet Commonly known as: TOPROL-XL Take 1 tablet by mouth twice daily   modafinil 200 MG tablet Commonly known as: PROVIGIL Take 1 tablet by mouth once daily   nitroGLYCERIN 0.4 MG SL tablet Commonly known as: NITROSTAT DISSOLVE 1 TABLET UNDER TONGUE EVERY 5 MINUTES UP TO 15 MIN FOR CHESTPAIN. IF NO RELIEF CALL 911.   ONE TOUCH ULTRA TEST test strip Generic drug: glucose blood TEST BLOOD SUGAR UP TO 4 TIMES DAILY.   OneTouch Delica Lancets 99991111 Misc USE AS DIRECTED TO TEST BLOOD SUGAR 4 TIMES DAILY.   pravastatin 20 MG tablet Commonly known as: PRAVACHOL Take 1 tablet (20 mg total) by mouth every evening.   rOPINIRole  4 MG tablet Commonly known as: REQUIP Take 0.5 tablets (2 mg total) by mouth 2 (two) times daily.   rOPINIRole 4 MG 24 hr tablet Commonly known as: REQUIP XL Take 1 tablet (4 mg total) by mouth at bedtime.   sodium chloride 0.65 % Soln nasal spray Commonly known as: OCEAN Place 1-2 sprays into both nostrils daily as needed for congestion.   Tyler Aas FlexTouch 100 UNIT/ML Sopn FlexTouch Pen Generic drug: insulin degludec Inject 1 mL (100 Units total) into the skin at bedtime.   Vitamin D (  Ergocalciferol) 1.25 MG (50000 UNIT) Caps capsule Commonly known as: DRISDOL Take 1 capsule by mouth once a week       Past Surgical History:  He  has a past surgical history that includes Total knee arthroplasty (2003); Lumbar disc surgery; Wrist surgery; Total knee revision (2005); Colonoscopy; Back surgery; Knee surgery; Total knee arthroplasty (Right, 03/23/2014); Tonsillectomy; Hip surgery; Eye surgery (Left, 2016); Cataract extraction (Bilateral); Knee arthrotomy (Right, 12/04/2014); Cyst removal trunk; and LEFT HEART CATH AND CORONARY ANGIOGRAPHY (N/A, 01/17/2018).  Family History:  His family history includes Diabetes in his sister; Heart attack in his father; Hypertension in his father; Kidney Stones in his father; Narcolepsy in his grandchild; Seizures in his grandchild.  Social History:  He  reports that he quit smoking about 24 years ago. His smoking use included cigarettes. He started smoking about 51 years ago. He has never used smokeless tobacco. He reports that he does not drink alcohol or use drugs.

## 2019-01-13 NOTE — Patient Instructions (Signed)
Stop arnuity  Breo one puff daily, and rinse mouth after each [place]  Follow up in 4 months

## 2019-01-14 ENCOUNTER — Ambulatory Visit: Payer: Medicare HMO | Admitting: "Endocrinology

## 2019-01-14 ENCOUNTER — Encounter: Payer: Self-pay | Admitting: "Endocrinology

## 2019-01-14 VITALS — BP 143/79 | HR 72 | Temp 97.4°F | Ht 65.0 in | Wt 230.6 lb

## 2019-01-14 DIAGNOSIS — E1159 Type 2 diabetes mellitus with other circulatory complications: Secondary | ICD-10-CM | POA: Diagnosis not present

## 2019-01-14 DIAGNOSIS — E038 Other specified hypothyroidism: Secondary | ICD-10-CM

## 2019-01-14 DIAGNOSIS — E782 Mixed hyperlipidemia: Secondary | ICD-10-CM | POA: Diagnosis not present

## 2019-01-14 DIAGNOSIS — I10 Essential (primary) hypertension: Secondary | ICD-10-CM

## 2019-01-14 LAB — POCT GLYCOSYLATED HEMOGLOBIN (HGB A1C): Hemoglobin A1C: 9 % — AB (ref 4.0–5.6)

## 2019-01-14 MED ORDER — GLIPIZIDE ER 10 MG PO TB24: 10.0000 mg | ORAL_TABLET | Freq: Every day | ORAL | 1 refills | Status: DC

## 2019-01-14 NOTE — Progress Notes (Signed)
01/14/2019    Endocrinology follow-up note    Subjective:    Patient ID: Paul Baker, male    DOB: [birth year], PCP [name], MD   Past Medical History:  Diagnosis Date  . Arthritis   . Asthma   . Colon polyp   . Coronary atherosclerosis of native coronary artery    a. 2011: cath showing 90% stenosis along small non-dominant RCA (too small for PCI). b. 01/2018: cath showing nonobstructive CAD with 60 to 70% proximal to mid nondominant RCA stenosis, 50% mid LAD and 40 to 50% OM1  . DVT (deep venous thrombosis) ([place]) 2005   Right arm  . Essential hypertension, benign   . Headache   . History of transfusion   . Hypersomnia    CPAP of 16 cm, diagnosed with AHI of 60 in 2012,[place] 21- narcolepsy?  [name] Hypothyroidism   . Mixed hyperlipidemia   . Morbid obesity ([place])   . MRSA (methicillin resistant staph aureus) culture positive    08/2012  . Narcolepsy   . OSA (obstructive sleep apnea)    CPAP  . Osteoarthritis   . Pneumonia   . Psoriasis   . Pulmonary embolism ([place]) 2004  . RLS (restless legs syndrome)   . Rotator cuff disorder    Left  . Septic arthritis of knee, left ([place])   . Skin cancer, basal cell   . Type 2 diabetes mellitus ([place])    Past Surgical History:  Procedure Laterality Date  . BACK SURGERY    . CATARACT EXTRACTION Bilateral   . COLONOSCOPY    . CYST REMOVAL TRUNK     from back  . EYE SURGERY Left 2016   laser to left eye  . HIP SURGERY     bone removed from both sides of hip  . KNEE ARTHROTOMY Right 12/04/2014   Procedure: KNEE ARTHROTOMY PATELLA LIGAMENT RECONSTRUSION AND REPAIR RIGHT KNEE;  Surgeon: Paralee Cancel, MD;  Location: [facility];  Service: Orthopedics;  Laterality: Right;  . KNEE SURGERY     X 25 TIMES  . LEFT HEART CATH AND CORONARY ANGIOGRAPHY N/A 01/17/2018   Procedure: LEFT HEART CATH AND CORONARY ANGIOGRAPHY;  Surgeon: Belva Crome, MD;  Location: [facility];  Service: Cardiovascular;  Laterality: N/A;  . LUMBAR  DISC SURGERY     Left L3, L4, L5 discecotomy with decompression of L4 root  . TONSILLECTOMY    . TOTAL KNEE ARTHROPLASTY  2003   LEFT  . TOTAL KNEE ARTHROPLASTY Right 03/23/2014   Procedure: RIGHT TOTAL KNEE ARTHROPLASTY AND REMOVAL RIGHT TIBIAL  DEEP IMPLANT STAPLE;  Surgeon: Paralee Cancel, MD;  Location: [facility];  Service: Orthopedics;  Laterality: Right;  . TOTAL KNEE REVISION  2005   LEFT  . WRIST SURGERY     Social History   Socioeconomic History  . Marital status: Married    Spouse name: Toney Reil  . Number of children: 2  . Years of education: college  . Highest education level: Not on file  Occupational History  . Occupation: Disabled    Employer: UNEMPLOYED  Tobacco Use  . Smoking status: Former Smoker    Types: Cigarettes    Start date: 06/19/1967    Quit date: 01/03/1995    Years since quitting: 24.0  . Smokeless tobacco: Never Used  Substance and Sexual Activity  . Alcohol use: No    Alcohol/week: 0.0 standard drinks    Comment: quit drinking in 07/86  . Drug use: No  .  Sexual activity: Yes    Partners: Female  Other Topics Concern  . Not on file  Social History Narrative    69 year old, right-handed, caucasian male with a past medical history of obesity, hypertension, hyperlipidemia, diabetes, obstructive sleep apnea, presenting with frequent nighttime awakenings, excessive daytime sleepiness, also transient confusional episodes.RLS and one beosity, OSA on CPAP with AHI of 3.2 and  setting of 16 cm water , [facility] .   Social Determinants of Health   Financial Resource Strain:   . Difficulty of Paying Living Expenses: Not on file  Food Insecurity:   . Worried About Charity fundraiser in the Last Year: Not on file  . Ran Out of Food in the Last Year: Not on file  Transportation Needs:   . Lack of Transportation (Medical): Not on file  . Lack of Transportation (Non-Medical): Not on file  Physical Activity:   . Days of Exercise per Week: Not on file  .  Minutes of Exercise per Session: Not on file  Stress:   . Feeling of Stress : Not on file  Social Connections:   . Frequency of Communication with Friends and Family: Not on file  . Frequency of Social Gatherings with Friends and Family: Not on file  . Attends Religious Services: Not on file  . Active Member of Clubs or Organizations: Not on file  . Attends Archivist Meetings: Not on file  . Marital Status: Not on file   Outpatient Encounter Medications as of 01/14/2019  Medication Sig  . albuterol (VENTOLIN HFA) 108 (90 Base) MCG/ACT inhaler Inhale 2 puffs into the lungs every 6 (six) hours as needed for wheezing or shortness of breath.  [name] amLODipine (NORVASC) 5 MG tablet Take 1 tablet (5 mg total) by mouth daily.  [name] aspirin EC 81 MG tablet Take 1 tablet (81 mg total) by mouth daily.  . B-D ULTRAFINE III SHORT PEN 31G X 8 MM MISC USING FIVE TIMES DAILY.  [name] Continuous Blood Gluc Receiver (DEXCOM G6 RECEIVER) DEVI 1 Piece by Does not apply route as needed.  . Continuous Blood Gluc Sensor (DEXCOM G6 SENSOR) MISC 4 Pieces by Does not apply route once a week.  Arna Medici 137 MCG tablet TAKE 1 TABLET BY MOUTH ONCE DAILY BEFORE BREAKFAST  . fenofibrate 160 MG tablet TAKE 1 TABLET BY MOUTH AT BEDTIME -  . fluticasone furoate-vilanterol (BREO ELLIPTA) 200-25 MCG/INH AEPB Inhale 1 puff into the lungs daily.  . furosemide (LASIX) 40 MG tablet Take 40 mg by mouth. Take 40 mg alternating with 80 mg Daily  . glipiZIDE (GLUCOTROL XL) 10 MG 24 hr tablet Take 1 tablet (10 mg total) by mouth daily with breakfast.  . insulin degludec (TRESIBA FLEXTOUCH) 100 UNIT/ML SOPN FlexTouch Pen Inject 1 mL (100 Units total) into the skin at bedtime.  . insulin regular (NOVOLIN R) 100 units/mL injection Inject 0.3-0.36 mLs (30-36 Units total) into the skin 3 (three) times daily before meals.  . Insulin Syringe-Needle U-100 (INSULIN SYRINGE 1CC/31GX5/16") 31G X 5/16" 1 ML MISC 1 each by Does not apply route 2  (two) times daily.  . isosorbide mononitrate (IMDUR) 60 MG 24 hr tablet Take 60 mg ( 1 Tablet)  in the AM and Take 30 mg ( 1/2 Tablet)  in the PM  . loratadine (CLARITIN) 10 MG tablet Take 10 mg by mouth daily as needed for allergies.   [name] losartan (COZAAR) 25 MG tablet Take 25 mg by mouth daily.  [name]  metFORMIN (GLUCOPHAGE) 500 MG tablet TAKE 1 TABLET BY MOUTH TWICE DAILY WITH MEALS  . metoprolol succinate (TOPROL-XL) 50 MG 24 hr tablet Take 1 tablet by mouth twice daily  . modafinil (PROVIGIL) 200 MG tablet Take 1 tablet by mouth once daily  . nitroGLYCERIN (NITROSTAT) 0.4 MG SL tablet DISSOLVE 1 TABLET UNDER TONGUE EVERY 5 MINUTES UP TO 15 MIN FOR CHESTPAIN. IF NO RELIEF CALL 911.  [name] ONE TOUCH ULTRA TEST test strip TEST BLOOD SUGAR UP TO 4 TIMES DAILY.  [name] ONETOUCH DELICA LANCETS 99991111 MISC USE AS DIRECTED TO TEST BLOOD SUGAR 4 TIMES DAILY.  [name] rOPINIRole (REQUIP XL) 4 MG 24 hr tablet Take 1 tablet (4 mg total) by mouth at bedtime.  [name] rOPINIRole (REQUIP) 4 MG tablet Take 0.5 tablets (2 mg total) by mouth 2 (two) times daily.  . sodium chloride (OCEAN) 0.65 % SOLN nasal spray Place 1-2 sprays into both nostrils daily as needed for congestion.  . Vitamin D, Ergocalciferol, (DRISDOL) 1.25 MG (50000 UT) CAPS capsule Take 1 capsule by mouth once a week  . [DISCONTINUED] glipiZIDE (GLUCOTROL XL) 5 MG 24 hr tablet Take 1 tablet (5 mg total) by mouth daily with breakfast.  . pravastatin (PRAVACHOL) 20 MG tablet Take 1 tablet (20 mg total) by mouth every evening.   No facility-administered encounter medications on file as of 01/14/2019.   ALLERGIES: Allergies  Allergen Reactions  . Cardizem [Diltiazem Hcl]     Edema   . Cardura [Doxazosin Mesylate]     Headaches / cramps  . Lipitor [Atorvastatin] Other (See Comments)    Leg cramps  . Paxil [Paroxetine Hcl]     Unknown reaction   . Adhesive [Tape] Rash    Pulls off skin  . Codeine Rash and Other (See Comments)    Headache   . Gabapentin Rash    VACCINATION STATUS: Immunization History  Administered Date(s) Administered  . Influenza Split 09/18/2012  . Influenza,inj,Quad PF,6+ Mos 10/13/2013, 10/06/2014, 09/11/2018  . Influenza-Unspecified 12/01/2015, 11/08/2016  . Pneumococcal Polysaccharide-23 10/02/2004, 10/03/2011  . Td 05/24/2006    Diabetes He presents for his follow-up diabetic visit. He has type 2 diabetes mellitus. Onset time: Was diagnosed at approximate age of 24 years. His disease course has been improving. There are no hypoglycemic associated symptoms. Pertinent negatives for hypoglycemia include no confusion, headaches, pallor or seizures. Pertinent negatives for diabetes include no chest pain, no fatigue, no polydipsia, no polyphagia, no polyuria and no weakness. There are no hypoglycemic complications. Symptoms are improving. Diabetic complications include heart disease. Risk factors for coronary artery disease include diabetes mellitus, dyslipidemia, male sex, obesity, sedentary lifestyle and tobacco exposure. Current diabetic treatment includes intensive insulin program. He is compliant with treatment most of the time. His weight is fluctuating minimally. He is following a generally unhealthy diet. He has had a previous visit with a dietitian. He never participates in exercise. His home blood glucose trend is decreasing steadily. His breakfast blood glucose range is generally 180-200 mg/dl. His lunch blood glucose range is generally >200 mg/dl. His dinner blood glucose range is generally >200 mg/dl. His bedtime blood glucose range is generally >200 mg/dl. His overall blood glucose range is >200 mg/dl. (He presents with his Dexcom CGM printout showing 36% time in range, 65% above range.  His average blood glucose is 210, point-of-care A1c is 9% improving from 10.2%.   ) An ACE inhibitor/angiotensin II receptor blocker is being taken. He sees a podiatrist.Eye exam is current.  Hyperlipidemia  This is a chronic problem. The  current episode started more than 1 year ago. The problem is controlled. Exacerbating diseases include diabetes, hypothyroidism and obesity. Pertinent negatives include no chest pain, myalgias or shortness of breath. Current antihyperlipidemic treatment includes statins. Risk factors for coronary artery disease include dyslipidemia, diabetes mellitus, hypertension, male sex, obesity and a sedentary lifestyle.  Hypertension This is a chronic problem. The current episode started more than 1 year ago. The problem is controlled. Pertinent negatives include no chest pain, headaches, neck pain, palpitations or shortness of breath. Risk factors for coronary artery disease include diabetes mellitus, dyslipidemia, male gender, obesity and sedentary lifestyle. Past treatments include angiotensin blockers. Hypertensive end-organ damage includes CAD/MI. Identifiable causes of hypertension include a thyroid problem.  Thyroid Problem Presents for follow-up visit. Patient reports no constipation, diarrhea, fatigue or palpitations. The symptoms have been improving. Past treatments include levothyroxine. His past medical history is significant for diabetes and hyperlipidemia.   Review of systems: Limited as above.   Objective:    BP (!) 143/79 (BP Location: Right Arm, Patient Position: Sitting, Cuff Size: Normal)   Pulse 72   Temp (!) 97.4 F (36.3 C) (Oral)   Ht 5\' 5"  (1.651 m)   Wt 230 lb 9.6 oz (104.6 kg)   SpO2 98%   BMI 38.37 kg/m   Wt Readings from Last 3 Encounters:  01/14/19 230 lb 9.6 oz (104.6 kg)  01/13/19 230 lb 3.2 oz (104.4 kg)  12/16/18 231 lb 9.6 oz (105.1 kg)     CMP Latest Ref Rng & Units 09/18/2018 09/11/2018 05/13/2018  Glucose 65 - 99 mg/dL 75 346(H) 174(H)  BUN 7 - 25 mg/dL 24 22 31(H)  Creatinine 0.70 - 1.25 mg/dL 1.15 1.30(H) 1.25  Sodium 135 - 146 mmol/L 142 138 137  Potassium 3.5 - 5.3 mmol/L 4.3 4.8 4.3  Chloride 98 - 110 mmol/L 105 98 105  CO2 20 - 32 mmol/L 28 23 26   Calcium  8.6 - 10.3 mg/dL 9.7 9.9 9.5  Total Protein 6.1 - 8.1 g/dL 7.1 6.9 6.3  Total Bilirubin 0.2 - 1.2 mg/dL 0.3 0.3 0.3  Alkaline Phos 39 - 117 IU/L - 52 -  AST 10 - 35 U/L 15 16 16   ALT 9 - 46 U/L 17 18 18      Diabetic Labs (most recent): Lab Results  Component Value Date   HGBA1C 9.0 (A) 01/14/2019   HGBA1C 10.2 (H) 09/18/2018   HGBA1C 9.6 (H) 05/13/2018   Lipid Panel     Component Value Date/Time   CHOL 152 01/14/2018 0749   CHOL 148 08/19/2015 0914   TRIG 107 01/14/2018 0749   HDL 49 01/14/2018 0749   HDL 53 08/19/2015 0914   CHOLHDL 3.1 01/14/2018 0749   VLDL 31 11/13/2013 0843   LDLCALC 83 01/14/2018 0749      Assessment & Plan:   1. Uncontrolled type 2 diabetes mellitus with circulatory complication, with long-term current use of insulin (HCC)  -His diabetes is  complicated by coronary artery disease and patient remains at an extremely high risk for more acute and chronic complications of diabetes which include CAD, CVA, CKD, retinopathy, and neuropathy. These are all discussed in detail with the patient. -On his current large dose of insulin, he documented improving glycemic profile, staying above target.  His point-of-care A1c is 9%, improving from 10.2%.   -I discussed his Dexcom CGM printouts with him.  -He did not document no report of hypoglycemia.  Glucose logs and insulin administration records pertaining to this visit,  to be scanned into patient's records.  Recent labs reviewed.  - I have re-counseled the patient on diet management and weight loss  by adopting a carbohydrate restricted / protein rich  Diet.  - - he  admits there is a room for improvement in his diet and drink choices. -  Suggestion is made for him to avoid simple carbohydrates  from his diet including Cakes, Sweet Desserts / Pastries, Ice Cream, Soda (diet and regular), Sweet Tea, Candies, Chips, Cookies, Sweet Pastries,  Store Bought Juices, Alcohol in Excess of  1-2 drinks a day,  Artificial Sweeteners, Coffee Creamer, and "Sugar-free" Products. This will help patient to have stable blood glucose profile and potentially avoid unintended weight gain.   - Patient is advised to stick to a routine mealtimes to eat 3 meals  a day and avoid unnecessary snacks ( to snack only to correct hypoglycemia).   - I have approached patient with the following individualized plan to manage diabetes and patient agrees.  -He will continue to need intensive treatment with basal/bolus insulin at a higher dose in order for him to achieve and maintain control of diabetes to target.  -He is advised to continue Tresiba 100 units nightly, continue regular insulin  30-36 units 3 times a day before breakfast, lunch, and supper for pre-meal blood glucose  above 90 mg/dL.  - He is currently using Dexcom CGM, advised to continue document blood glucose at least 4 times a day-before meals and at bedtime.   -Patient is encouraged to call clinic for blood glucose levels less than 70 or above 200 mg /dl x 3. -He will be considered for insulin Humulin U500 during his next visit in 4 weeks.    -He is advised to continue Metformin 500 mg p.o. twice daily.  He is advised to continue glipizide 10 mg XL p.o. daily with breakfast.  2) BP/HTN: His blood pressure is not controlled to target.   He is advised to continue  his current blood pressure medications including amlodipine, metoprolol.  He will be considered for low-dose lisinopril if he has microalbuminuria.   3) Lipids/HPL: His recent labs show LDL controlled at 80.  He is advised to continue pravastatin 20 mg p.o. nightly.    4)  Weight/Diet: CDE consult in progress, exercise, and carbohydrates information provided.  5) hypothyroidism -His recent thyroid function tests are consistent with appropriate replacement.  He is advised to continue  levothyroxine 137 mcg p.o. every morning.    - We discussed about the correct intake of his thyroid hormone, on  empty stomach at fasting, with water, separated by at least 30 minutes from breakfast and other medications,  and separated by more than 4 hours from calcium, iron, multivitamins, acid reflux medications (PPIs). -Patient is made aware of the fact that thyroid hormone replacement is needed for life, dose to be adjusted by periodic monitoring of thyroid function tests.   6) Chronic Care/Health Maintenance:  -Patient is on ACEI/ARB and Statin medications and encouraged to continue to follow up with Ophthalmology, Podiatrist at least yearly or according to recommendations, and advised to  stay away from smoking. I have recommended yearly flu vaccine and pneumonia vaccination at least every 5 years; moderate intensity exercise for up to 150 minutes weekly; and  sleep for at least 7 hours a day.  - I advised patient to maintain close follow up with [name], MD for primary care  needs.  - Time spent on this patient care encounter:  35 min, of which >50% was spent in  counseling and the rest reviewing his  current and  previous labs/studies ( including abstraction from other facilities),  previous treatments, his blood glucose readings, and medications' doses and developing a plan for long-term care based on the latest recommendations for standards of care; and documenting his care.  Paul Baker participated in the discussions, expressed understanding, and voiced agreement with the above plans.  All questions were answered to his satisfaction. he is encouraged to contact clinic should he have any questions or concerns prior to his return visit.   Follow up plan: -Return in about 4 weeks (around 02/11/2019) for Follow up with Pre-visit Labs, Bring Meter and Logs- A1c in Office.  Glade Lloyd, MD Phone: [phone]  Fax: [phone]  This note was partially dictated with voice recognition software. Similar sounding words can be transcribed inadequately or may not  be corrected upon  review.  01/14/2019, 1:22 PM

## 2019-01-16 DIAGNOSIS — E1165 Type 2 diabetes mellitus with hyperglycemia: Secondary | ICD-10-CM | POA: Diagnosis not present

## 2019-02-04 ENCOUNTER — Other Ambulatory Visit: Payer: Self-pay | Admitting: "Endocrinology

## 2019-02-04 ENCOUNTER — Encounter: Payer: Self-pay | Admitting: Adult Health

## 2019-02-04 ENCOUNTER — Telehealth (INDEPENDENT_AMBULATORY_CARE_PROVIDER_SITE_OTHER): Payer: Medicare HMO | Admitting: Adult Health

## 2019-02-04 DIAGNOSIS — J4550 Severe persistent asthma, uncomplicated: Secondary | ICD-10-CM | POA: Diagnosis not present

## 2019-02-04 MED ORDER — BUDESONIDE-FORMOTEROL FUMARATE 160-4.5 MCG/ACT IN AERO: 2.0000 | INHALATION_SPRAY | Freq: Two times a day (BID) | RESPIRATORY_TRACT | 12 refills | Status: DC

## 2019-02-04 NOTE — Patient Instructions (Addendum)
Remain off Breo.  Will place on allergy list Begin Symbicort 160 2 puffs twice daily, rinse after use Albuterol as needed for wheezing and shortness of breath Follow-up in 4 to 6 weeks and as needed with Dr. Halford Chessman   Please contact office for sooner follow up if symptoms do not improve or worsen or seek emergency care

## 2019-02-04 NOTE — Addendum Note (Signed)
Addended by: Melvenia Needles on: 02/04/2019 05:13 PM   Modules accepted: Orders

## 2019-02-04 NOTE — Progress Notes (Signed)
Reviewed and agree with assessment/plan.   Shamere Campas, MD [facility] 12/29/2015, 12:24 PM Pager:  [phone]  

## 2019-02-04 NOTE — Progress Notes (Signed)
Virtual Visit via Video Note  I connected with Paul Baker on 02/04/19 at  3:00 PM EST by a video enabled telemedicine application and verified that I am speaking with the correct person using two identifiers.  Location: Patient: Home  Provider: Office    I discussed the limitations of evaluation and management by telemedicine and the availability of in person appointments. The patient expressed understanding and agreed to proceed.  History of Present Illness: 69 year old male former smoker followed for asthma.  Today's video visit is a follow-up for asthma.  Patient was recently seen earlier this month.  He was improving with his asthma symptoms with decreased cough and wheezing however they continue to persist on Arnuity.  Patient was recommended to change from [place] to [facility].  Patient says after changed to [facility].  That he started having some itching all over.  He tried several things to see if possibly detergents or other items were causing itching however he stopped the [facility] for couple days and the itching subsided.  He feels that [employer] is the culprit.  He does feel that Breo helped his asthma though.  And wants an alternative.  Patient denies any chest pain orthopnea PND or increased leg swelling.  Does have some occasional dry cough and wheezing. Patient denied any rash.  No lip swelling. No difficulty swallowing.  Observations/Objective:  Spirometry 10/17/11 >> 2.58 (89%), FEV1% 85 PFT 09/11/18 >> FEV1 2.19 (79%), FEV1% 82, TLC 4.48 (74%), DLCO 70%, +BD from change in FEF 25-75%  Chest imaging:  HRCT chest 12/19/18 >> atherosclerosis, patchy air trapping, fatty liver  Sleep tests:  PSG 2012 >> AHI 60  Cardiac tests:  LHC 01/17/18 >> non obstructive CAD Echo 02/21/18 >> EF 55 to 60%   Assessment and Plan: Asthma with improved symptom control on Arnuity and Breo.  However Breo patient is intolerant.  He had improved symptom control on combination ICS and LABA.  We will try  Symbicort in place of Breo.  Placed Breo on allergy list. Have patient follow back up in 4 to 6 weeks and as needed  Plan  . Patient Instructions  Remain off Breo.  Will place on allergy list Begin Symbicort 160 2 puffs twice daily, rinse after use Albuterol as needed for wheezing and shortness of breath Follow-up in 4 to 6 weeks and as needed with Dr. Halford Chessman   Please contact office for sooner follow up if symptoms do not improve or worsen or seek emergency care         Follow Up Instructions: Follow-up in 4 to 6 weeks and as needed     I discussed the assessment and treatment plan with the patient. The patient was provided an opportunity to ask questions and all were answered. The patient agreed with the plan and demonstrated an understanding of the instructions.   The patient was advised to call back or seek an in-person evaluation if the symptoms worsen or if the condition fails to improve as anticipated.  I provided  22 minutes of non-face-to-face time during this encounter.   Rexene Edison, NP

## 2019-02-06 ENCOUNTER — Encounter: Payer: Self-pay | Admitting: Family Medicine

## 2019-02-06 DIAGNOSIS — E1159 Type 2 diabetes mellitus with other circulatory complications: Secondary | ICD-10-CM | POA: Diagnosis not present

## 2019-02-06 DIAGNOSIS — E038 Other specified hypothyroidism: Secondary | ICD-10-CM | POA: Diagnosis not present

## 2019-02-06 DIAGNOSIS — E559 Vitamin D deficiency, unspecified: Secondary | ICD-10-CM | POA: Diagnosis not present

## 2019-02-07 LAB — COMPLETE METABOLIC PANEL WITH GFR
AG Ratio: 1.7 (calc) (ref 1.0–2.5)
ALT: 15 U/L (ref 9–46)
AST: 12 U/L (ref 10–35)
Albumin: 4.3 g/dL (ref 3.6–5.1)
Alkaline phosphatase (APISO): 56 U/L (ref 35–144)
BUN/Creatinine Ratio: 27 (calc) — ABNORMAL HIGH (ref 6–22)
BUN: 30 mg/dL — ABNORMAL HIGH (ref 7–25)
CO2: 25 mmol/L (ref 20–32)
Calcium: 9.8 mg/dL (ref 8.6–10.3)
Chloride: 102 mmol/L (ref 98–110)
Creat: 1.11 mg/dL (ref 0.70–1.25)
GFR, Est African American: 79 mL/min/{1.73_m2} (ref >=60)
GFR, Est Non African American: 68 mL/min/{1.73_m2} (ref >=60)
Globulin: 2.5 g/dL (calc) (ref 1.9–3.7)
Glucose, Bld: 296 mg/dL — ABNORMAL HIGH (ref 65–99)
Potassium: 4.7 mmol/L (ref 3.5–5.3)
Sodium: 137 mmol/L (ref 135–146)
Total Bilirubin: 0.3 mg/dL (ref 0.2–1.2)
Total Protein: 6.8 g/dL (ref 6.1–8.1)

## 2019-02-07 LAB — LIPID PANEL
Cholesterol: 193 mg/dL (ref <200)
HDL: 49 mg/dL (ref >=40)
LDL Cholesterol (Calc): 118 mg/dL (calc) — ABNORMAL HIGH
Non-HDL Cholesterol (Calc): 144 mg/dL (calc) — ABNORMAL HIGH (ref <130)
Total CHOL/HDL Ratio: 3.9 (calc) (ref <5.0)
Triglycerides: 145 mg/dL (ref <150)

## 2019-02-07 LAB — T4, FREE: Free T4: 1.5 ng/dL (ref 0.8–1.8)

## 2019-02-07 LAB — MICROALBUMIN / CREATININE URINE RATIO
Creatinine, Urine: 107 mg/dL (ref 20–320)
Microalb Creat Ratio: 89 mcg/mg creat — ABNORMAL HIGH (ref <30)
Microalb, Ur: 9.5 mg/dL

## 2019-02-07 LAB — VITAMIN D 25 HYDROXY (VIT D DEFICIENCY, FRACTURES): Vit D, 25-Hydroxy: 70 ng/mL (ref 30–100)

## 2019-02-07 LAB — TSH: TSH: 1.43 mIU/L (ref 0.40–4.50)

## 2019-02-12 ENCOUNTER — Other Ambulatory Visit: Payer: Self-pay

## 2019-02-12 ENCOUNTER — Ambulatory Visit (INDEPENDENT_AMBULATORY_CARE_PROVIDER_SITE_OTHER): Payer: Medicare HMO | Admitting: "Endocrinology

## 2019-02-12 ENCOUNTER — Encounter: Payer: Self-pay | Admitting: "Endocrinology

## 2019-02-12 VITALS — BP 157/84 | HR 70 | Ht 65.0 in | Wt 235.0 lb

## 2019-02-12 DIAGNOSIS — I10 Essential (primary) hypertension: Secondary | ICD-10-CM | POA: Diagnosis not present

## 2019-02-12 DIAGNOSIS — E782 Mixed hyperlipidemia: Secondary | ICD-10-CM | POA: Diagnosis not present

## 2019-02-12 DIAGNOSIS — E1159 Type 2 diabetes mellitus with other circulatory complications: Secondary | ICD-10-CM

## 2019-02-12 DIAGNOSIS — E038 Other specified hypothyroidism: Secondary | ICD-10-CM

## 2019-02-12 MED ORDER — HUMULIN R U-500 KWIKPEN 500 UNIT/ML ~~LOC~~ SOPN: 50.0000 [IU] | PEN_INJECTOR | Freq: Three times a day (TID) | SUBCUTANEOUS | 2 refills | Status: DC

## 2019-02-12 NOTE — Patient Instructions (Signed)

## 2019-02-12 NOTE — Progress Notes (Signed)
02/12/2019   Endocrinology follow-up note   Subjective:    Patient ID: Paul Baker, male    DOB: [birth year], PCP [name], MD   Past Medical History:  Diagnosis Date  . Arthritis   . Asthma   . Colon polyp   . Coronary atherosclerosis of native coronary artery    a. 2011: cath showing 90% stenosis along small non-dominant RCA (too small for PCI). b. 01/2018: cath showing nonobstructive CAD with 60 to 70% proximal to mid nondominant RCA stenosis, 50% mid LAD and 40 to 50% OM1  . DVT (deep venous thrombosis) ([place]) 2005   Right arm  . Essential hypertension, benign   . Headache   . History of transfusion   . Hypersomnia    CPAP of 16 cm, diagnosed with AHI of 60 in 2012,[place] 21- narcolepsy?  [name] Hypothyroidism   . Mixed hyperlipidemia   . Morbid obesity ([place])   . MRSA (methicillin resistant staph aureus) culture positive    08/2012  . Narcolepsy   . OSA (obstructive sleep apnea)    CPAP  . Osteoarthritis   . Pneumonia   . Psoriasis   . Pulmonary embolism ([place]) 2004  . RLS (restless legs syndrome)   . Rotator cuff disorder    Left  . Septic arthritis of knee, left ([place])   . Skin cancer, basal cell   . Type 2 diabetes mellitus ([place])    Past Surgical History:  Procedure Laterality Date  . BACK SURGERY    . CATARACT EXTRACTION Bilateral   . COLONOSCOPY    . CYST REMOVAL TRUNK     from back  . EYE SURGERY Left 2016   laser to left eye  . HIP SURGERY     bone removed from both sides of hip  . KNEE ARTHROTOMY Right 12/04/2014   Procedure: KNEE ARTHROTOMY PATELLA LIGAMENT RECONSTRUSION AND REPAIR RIGHT KNEE;  Surgeon: Paralee Cancel, MD;  Location: [facility];  Service: Orthopedics;  Laterality: Right;  . KNEE SURGERY     X 25 TIMES  . LEFT HEART CATH AND CORONARY ANGIOGRAPHY N/A 01/17/2018   Procedure: LEFT HEART CATH AND CORONARY ANGIOGRAPHY;  Surgeon: Belva Crome, MD;  Location: [facility];  Service: Cardiovascular;  Laterality: N/A;  . LUMBAR DISC  SURGERY     Left L3, L4, L5 discecotomy with decompression of L4 root  . TONSILLECTOMY    . TOTAL KNEE ARTHROPLASTY  2003   LEFT  . TOTAL KNEE ARTHROPLASTY Right 03/23/2014   Procedure: RIGHT TOTAL KNEE ARTHROPLASTY AND REMOVAL RIGHT TIBIAL  DEEP IMPLANT STAPLE;  Surgeon: Paralee Cancel, MD;  Location: [facility];  Service: Orthopedics;  Laterality: Right;  . TOTAL KNEE REVISION  2005   LEFT  . WRIST SURGERY     Social History   Socioeconomic History  . Marital status: Married    Spouse name: Toney Reil  . Number of children: 2  . Years of education: college  . Highest education level: Not on file  Occupational History  . Occupation: Disabled    Employer: UNEMPLOYED  Tobacco Use  . Smoking status: Former Smoker    Packs/day: 1.50    Years: 10.00    Pack years: 15.00    Types: Cigarettes    Start date: 06/19/1967    Quit date: 01/03/1995    Years since quitting: 24.1  . Smokeless tobacco: Never Used  Substance and Sexual Activity  . Alcohol use: No    Alcohol/week: 0.0 standard  drinks    Comment: quit drinking in 07/86  . Drug use: No  . Sexual activity: Yes    Partners: Female  Other Topics Concern  . Not on file  Social History Narrative    69 year old, right-handed, caucasian male with a past medical history of obesity, hypertension, hyperlipidemia, diabetes, obstructive sleep apnea, presenting with frequent nighttime awakenings, excessive daytime sleepiness, also transient confusional episodes.RLS and one beosity, OSA on CPAP with AHI of 3.2 and  setting of 16 cm water , [facility] .   Social Determinants of Health   Financial Resource Strain:   . Difficulty of Paying Living Expenses: Not on file  Food Insecurity:   . Worried About Charity fundraiser in the Last Year: Not on file  . Ran Out of Food in the Last Year: Not on file  Transportation Needs:   . Lack of Transportation (Medical): Not on file  . Lack of Transportation (Non-Medical): Not on file  Physical  Activity:   . Days of Exercise per Week: Not on file  . Minutes of Exercise per Session: Not on file  Stress:   . Feeling of Stress : Not on file  Social Connections:   . Frequency of Communication with Friends and Family: Not on file  . Frequency of Social Gatherings with Friends and Family: Not on file  . Attends Religious Services: Not on file  . Active Member of Clubs or Organizations: Not on file  . Attends Archivist Meetings: Not on file  . Marital Status: Not on file   Outpatient Encounter Medications as of 02/12/2019  Medication Sig  . albuterol (VENTOLIN HFA) 108 (90 Base) MCG/ACT inhaler Inhale 2 puffs into the lungs every 6 (six) hours as needed for wheezing or shortness of breath.  [name] amLODipine (NORVASC) 5 MG tablet Take 1 tablet (5 mg total) by mouth daily.  . B-D ULTRAFINE III SHORT PEN 31G X 8 MM MISC USING FIVE TIMES DAILY.  [name] Continuous Blood Gluc Receiver (DEXCOM G6 RECEIVER) DEVI 1 Piece by Does not apply route as needed.  . Continuous Blood Gluc Sensor (DEXCOM G6 SENSOR) MISC 4 Pieces by Does not apply route once a week.  Arna Medici 137 MCG tablet TAKE 1 TABLET BY MOUTH ONCE DAILY BEFORE BREAKFAST  . fenofibrate 160 MG tablet TAKE 1 TABLET BY MOUTH AT BEDTIME -  . fluticasone furoate-vilanterol (BREO ELLIPTA) 200-25 MCG/INH AEPB Inhale 1 puff into the lungs daily. (Patient not taking: Reported on 02/04/2019)  . furosemide (LASIX) 40 MG tablet Take 40 mg by mouth. Take 40 mg alternating with 80 mg Daily  . glipiZIDE (GLUCOTROL XL) 10 MG 24 hr tablet Take 1 tablet (10 mg total) by mouth daily with breakfast.  . insulin regular human CONCENTRATED (HUMULIN R U-500 KWIKPEN) 500 UNIT/ML kwikpen Inject 50 Units into the skin 3 (three) times daily with meals.  . Insulin Syringe-Needle U-100 (INSULIN SYRINGE 1CC/31GX5/16") 31G X 5/16" 1 ML MISC 1 each by Does not apply route 2 (two) times daily.  . isosorbide mononitrate (IMDUR) 60 MG 24 hr tablet Take 60 mg ( 1 Tablet)   in the AM and Take 30 mg ( 1/2 Tablet)  in the PM  . loratadine (CLARITIN) 10 MG tablet Take 10 mg by mouth daily as needed for allergies.   [name] losartan (COZAAR) 25 MG tablet Take 25 mg by mouth daily.  . metFORMIN (GLUCOPHAGE) 500 MG tablet TAKE 1 TABLET BY MOUTH TWICE DAILY WITH MEALS  .  metoprolol succinate (TOPROL-XL) 50 MG 24 hr tablet Take 1 tablet by mouth twice daily  . modafinil (PROVIGIL) 200 MG tablet Take 1 tablet by mouth once daily  . nitroGLYCERIN (NITROSTAT) 0.4 MG SL tablet DISSOLVE 1 TABLET UNDER TONGUE EVERY 5 MINUTES UP TO 15 MIN FOR CHESTPAIN. IF NO RELIEF CALL 911.  [name] ONE TOUCH ULTRA TEST test strip TEST BLOOD SUGAR UP TO 4 TIMES DAILY.  [name] ONETOUCH DELICA LANCETS 99991111 MISC USE AS DIRECTED TO TEST BLOOD SUGAR 4 TIMES DAILY.  . pravastatin (PRAVACHOL) 20 MG tablet Take 1 tablet (20 mg total) by mouth every evening.  [name] rOPINIRole (REQUIP XL) 4 MG 24 hr tablet Take 1 tablet (4 mg total) by mouth at bedtime.  [name] rOPINIRole (REQUIP) 4 MG tablet Take 0.5 tablets (2 mg total) by mouth 2 (two) times daily.  . sodium chloride (OCEAN) 0.65 % SOLN nasal spray Place 1-2 sprays into both nostrils daily as needed for congestion.  . Vitamin D, Ergocalciferol, (DRISDOL) 1.25 MG (50000 UNIT) CAPS capsule Take 1 capsule by mouth once a week  . [DISCONTINUED] budesonide-formoterol (SYMBICORT) 160-4.5 MCG/ACT inhaler Inhale 2 puffs into the lungs 2 (two) times daily.  . [DISCONTINUED] insulin degludec (TRESIBA FLEXTOUCH) 100 UNIT/ML SOPN FlexTouch Pen Inject 1 mL (100 Units total) into the skin at bedtime.  . [DISCONTINUED] insulin regular (NOVOLIN R) 100 units/mL injection Inject 0.3-0.36 mLs (30-36 Units total) into the skin 3 (three) times daily before meals.   No facility-administered encounter medications on file as of 02/12/2019.   ALLERGIES: Allergies  Allergen Reactions  . Cardizem [Diltiazem Hcl]     Edema   . Cardura [Doxazosin Mesylate]     Headaches / cramps  . Lipitor  [Atorvastatin] Other (See Comments)    Leg cramps  . Paxil [Paroxetine Hcl]     Unknown reaction   . Adhesive [Tape] Rash    Pulls off skin  . Codeine Rash and Other (See Comments)    Headache   . Gabapentin Rash   VACCINATION STATUS: Immunization History  Administered Date(s) Administered  . Influenza Split 09/18/2012  . Influenza,inj,Quad PF,6+ Mos 10/13/2013, 10/06/2014, 09/11/2018  . Influenza-Unspecified 12/01/2015, 11/08/2016  . Pneumococcal Polysaccharide-23 10/02/2004, 10/03/2011  . Td 05/24/2006    Diabetes He presents for his follow-up diabetic visit. He has type 2 diabetes mellitus. Onset time: Was diagnosed at approximate age of 71 years. His disease course has been improving. There are no hypoglycemic associated symptoms. Pertinent negatives for hypoglycemia include no confusion, headaches, pallor or seizures. Pertinent negatives for diabetes include no chest pain, no fatigue, no polydipsia, no polyphagia, no polyuria and no weakness. There are no hypoglycemic complications. Symptoms are improving. Diabetic complications include heart disease. Risk factors for coronary artery disease include diabetes mellitus, dyslipidemia, male sex, obesity, sedentary lifestyle and tobacco exposure. Current diabetic treatment includes intensive insulin program. He is compliant with treatment most of the time. His weight is fluctuating minimally. He is following a generally unhealthy diet. He has had a previous visit with a dietitian. He never participates in exercise. His home blood glucose trend is decreasing steadily. His breakfast blood glucose range is generally >200 mg/dl. His lunch blood glucose range is generally >200 mg/dl. His dinner blood glucose range is generally >200 mg/dl. His bedtime blood glucose range is generally >200 mg/dl. His overall blood glucose range is 180-200 mg/dl. (He presents with his Dexcom CGM printout showing 44% time in range, 53% above range.  His average blood  glucose is  203,  Recent point-of-care A1c is 9% improving from 10.2%.   ) An ACE inhibitor/angiotensin II receptor blocker is being taken. He sees a podiatrist.Eye exam is current.  Hyperlipidemia This is a chronic problem. The current episode started more than 1 year ago. The problem is controlled. Exacerbating diseases include diabetes, hypothyroidism and obesity. Pertinent negatives include no chest pain, myalgias or shortness of breath. Current antihyperlipidemic treatment includes statins. Risk factors for coronary artery disease include dyslipidemia, diabetes mellitus, hypertension, male sex, obesity and a sedentary lifestyle.  Hypertension This is a chronic problem. The current episode started more than 1 year ago. The problem is controlled. Pertinent negatives include no chest pain, headaches, neck pain, palpitations or shortness of breath. Risk factors for coronary artery disease include diabetes mellitus, dyslipidemia, male gender, obesity and sedentary lifestyle. Past treatments include angiotensin blockers. Hypertensive end-organ damage includes CAD/MI. Identifiable causes of hypertension include a thyroid problem.  Thyroid Problem Presents for follow-up visit. Patient reports no constipation, diarrhea, fatigue or palpitations. The symptoms have been improving. Past treatments include levothyroxine. His past medical history is significant for diabetes and hyperlipidemia.    Review of systems  Constitutional: + Minimally fluctuating body weight,  current  Body mass index is 39.11 kg/m. , no fatigue, no subjective hyperthermia, no subjective hypothermia Eyes: no blurry vision, no xerophthalmia ENT: no sore throat, no nodules palpated in throat, no dysphagia/odynophagia, no hoarseness Cardiovascular: no Chest Pain, no Shortness of Breath, no palpitations, no leg swelling Respiratory: no cough, no shortness of breath Gastrointestinal: no Nausea/Vomiting/Diarhhea Musculoskeletal: no  muscle/joint aches Skin: no rashes, no hyperemia Neurological: no tremors, no numbness, no tingling, no dizziness Psychiatric: no depression, no anxiety    Objective:    BP (!) 157/84   Pulse 70   Ht 5\' 5"  (1.651 m)   Wt 235 lb (106.6 kg)   BMI 39.11 kg/m   Wt Readings from Last 3 Encounters:  02/12/19 235 lb (106.6 kg)  01/14/19 230 lb 9.6 oz (104.6 kg)  01/13/19 230 lb 3.2 oz (104.4 kg)     Physical Exam- Limited  Constitutional:  Body mass index is 39.11 kg/m. , not in acute distress, normal state of mind Eyes:  EOMI, no exophthalmos Neck: Supple Thyroid: No gross goiter Respiratory: Adequate breathing efforts Musculoskeletal: no gross deformities, strength intact in all four extremities, no gross restriction of joint movements Skin:  no rashes, no hyperemia Neurological: no tremor with outstretched hands,   CMP Latest Ref Rng & Units 02/06/2019 09/18/2018 09/11/2018  Glucose 65 - 99 mg/dL 296(H) 75 346(H)  BUN 7 - 25 mg/dL 30(H) 24 22  Creatinine 0.70 - 1.25 mg/dL 1.11 1.15 1.30(H)  Sodium 135 - 146 mmol/L 137 142 138  Potassium 3.5 - 5.3 mmol/L 4.7 4.3 4.8  Chloride 98 - 110 mmol/L 102 105 98  CO2 20 - 32 mmol/L 25 28 23   Calcium 8.6 - 10.3 mg/dL 9.8 9.7 9.9  Total Protein 6.1 - 8.1 g/dL 6.8 7.1 6.9  Total Bilirubin 0.2 - 1.2 mg/dL 0.3 0.3 0.3  Alkaline Phos 39 - 117 IU/L - - 52  AST 10 - 35 U/L 12 15 16   ALT 9 - 46 U/L 15 17 18      Diabetic Labs (most recent): Lab Results  Component Value Date   HGBA1C 9.0 (A) 01/14/2019   HGBA1C 10.2 (H) 09/18/2018   HGBA1C 9.6 (H) 05/13/2018   Lipid Panel     Component Value Date/Time   CHOL 193 02/06/2019 0813  CHOL 148 08/19/2015 0914   TRIG 145 02/06/2019 0813   HDL 49 02/06/2019 0813   HDL 53 08/19/2015 0914   CHOLHDL 3.9 02/06/2019 0813   VLDL 31 11/13/2013 0843   LDLCALC 118 (H) 02/06/2019 0813      Assessment & Plan:   1. Uncontrolled type 2 diabetes mellitus with circulatory complication, with  long-term current use of insulin (HCC)  -His diabetes is  complicated by coronary artery disease and patient remains at an extremely high risk for more acute and chronic complications of diabetes which include CAD, CVA, CKD, retinopathy, and neuropathy. These are all discussed in detail with the patient. -On his current large dose of insulin, he documented improving glycemic profile, staying above target.  His point-of-care A1c is 9%, improving from 10.2%.   -I discussed his Dexcom CGM printouts with him.  -He did not document no report of hypoglycemia.     Glucose logs and insulin administration records pertaining to this visit,  to be scanned into patient's records.  Recent labs reviewed.  - I have re-counseled the patient on diet management and weight loss  by adopting a carbohydrate restricted / protein rich  Diet.  - he  admits there is a room for improvement in his diet and drink choices. -  Suggestion is made for him to avoid simple carbohydrates  from his diet including Cakes, Sweet Desserts / Pastries, Ice Cream, Soda (diet and regular), Sweet Tea, Candies, Chips, Cookies, Sweet Pastries,  Store Bought Juices, Alcohol in Excess of  1-2 drinks a day, Artificial Sweeteners, Coffee Creamer, and "Sugar-free" Products. This will help patient to have stable blood glucose profile and potentially avoid unintended weight gain.  - Patient is advised to stick to a routine mealtimes to eat 3 meals  a day and avoid unnecessary snacks ( to snack only to correct hypoglycemia).   - I have approached patient with the following individualized plan to manage diabetes and patient agrees.  -He will continue to need intensive treatment with basal/bolus insulin at a higher dose in order for him to achieve and maintain control of diabetes to target.  -He has used his supplies of [place] and regular insulin I discussed and switched him to insulin U500 50 units 3 times daily AC for Premeal blood glucose greater  than 90 mg per DL.  He will continue to use his CGM device, Dexcom.    -Patient is encouraged to call clinic for blood glucose levels less than 70 or above 200 mg /dl x 3.   -He is advised to continue Metformin 500 mg p.o. twice daily.  He is advised to continue glipizide 10 mg XL p.o. daily with breakfast.  2) BP/HTN: His blood pressure is not controlled to target.     He is advised to continue his current blood pressure medications including amlodipine, metoprolol.  He will be considered for low-dose lisinopril if he has microalbuminuria.   3) Lipids/HPL: His recent labs show LDL controlled at 80.  He is advised to continue statin 20 mg p.o. nightly .   4)  Weight/Diet: CDE consult in progress, exercise, and carbohydrates information provided.  5) hypothyroidism -His recent thyroid function tests are consistent with appropriate replacement.  He is advised to continue  levothyroxine 137 mcg p.o. every morning.    - We discussed about the correct intake of his thyroid hormone, on empty stomach at fasting, with water, separated by at least 30 minutes from breakfast and other medications,  and separated  by more than 4 hours from calcium, iron, multivitamins, acid reflux medications (PPIs). -Patient is made aware of the fact that thyroid hormone replacement is needed for life, dose to be adjusted by periodic monitoring of thyroid function tests.   6) Chronic Care/Health Maintenance:  -Patient is on ACEI/ARB and Statin medications and encouraged to continue to follow up with Ophthalmology, Podiatrist at least yearly or according to recommendations, and advised to  stay away from smoking. I have recommended yearly flu vaccine and pneumonia vaccination at least every 5 years; moderate intensity exercise for up to 150 minutes weekly; and  sleep for at least 7 hours a day.  - I advised patient to maintain close follow up with [name], MD for primary care needs.  - Time spent on this  patient care encounter:  35 min, of which > 50% was spent in  counseling and the rest reviewing his blood glucose logs , discussing his hypoglycemia and hyperglycemia episodes, reviewing his current and  previous labs / studies  ( including abstraction from other facilities) and medications  doses and developing a  long term treatment plan and documenting his care.   Please refer to Patient Instructions for Blood Glucose Monitoring and Insulin/Medications Dosing Guide"  in media tab for additional information. Please  also refer to " Patient Self Inventory" in the Media  tab for reviewed elements of pertinent patient history.  Paul Baker participated in the discussions, expressed understanding, and voiced agreement with the above plans.  All questions were answered to his satisfaction. he is encouraged to contact clinic should he have any questions or concerns prior to his return visit.    Follow up plan: -Return in about 9 weeks (around 04/16/2019) for Bring Meter and Logs- A1c in Office.  Glade Lloyd, MD Phone: [phone]  Fax: [phone]  This note was partially dictated with voice recognition software. Similar sounding words can be transcribed inadequately or may not  be corrected upon review.  02/12/2019, 11:45 AM

## 2019-02-12 NOTE — Telephone Encounter (Signed)
FYI TP 

## 2019-02-18 ENCOUNTER — Other Ambulatory Visit: Payer: Self-pay

## 2019-02-18 ENCOUNTER — Telehealth: Payer: Self-pay | Admitting: *Deleted

## 2019-02-18 ENCOUNTER — Emergency Department (HOSPITAL_COMMUNITY): Payer: Medicare HMO

## 2019-02-18 ENCOUNTER — Observation Stay (HOSPITAL_COMMUNITY)
Admission: EM | Admit: 2019-02-18 | Discharge: 2019-02-19 | Disposition: A | Discharge status: Home / Self Care | Payer: Medicare HMO | Attending: Family Medicine | Admitting: Family Medicine

## 2019-02-18 ENCOUNTER — Encounter (HOSPITAL_COMMUNITY): Payer: Self-pay

## 2019-02-18 DIAGNOSIS — G2581 Restless legs syndrome: Secondary | ICD-10-CM

## 2019-02-18 DIAGNOSIS — I10 Essential (primary) hypertension: Secondary | ICD-10-CM | POA: Insufficient documentation

## 2019-02-18 DIAGNOSIS — E039 Hypothyroidism, unspecified: Secondary | ICD-10-CM | POA: Diagnosis not present

## 2019-02-18 DIAGNOSIS — E119 Type 2 diabetes mellitus without complications: Secondary | ICD-10-CM | POA: Insufficient documentation

## 2019-02-18 DIAGNOSIS — Z87891 Personal history of nicotine dependence: Secondary | ICD-10-CM | POA: Insufficient documentation

## 2019-02-18 DIAGNOSIS — Z9861 Coronary angioplasty status: Secondary | ICD-10-CM | POA: Insufficient documentation

## 2019-02-18 DIAGNOSIS — R42 Dizziness and giddiness: Secondary | ICD-10-CM | POA: Diagnosis not present

## 2019-02-18 DIAGNOSIS — E782 Mixed hyperlipidemia: Secondary | ICD-10-CM | POA: Diagnosis not present

## 2019-02-18 DIAGNOSIS — J45909 Unspecified asthma, uncomplicated: Secondary | ICD-10-CM | POA: Diagnosis not present

## 2019-02-18 DIAGNOSIS — I2510 Atherosclerotic heart disease of native coronary artery without angina pectoris: Secondary | ICD-10-CM | POA: Insufficient documentation

## 2019-02-18 DIAGNOSIS — Z86718 Personal history of other venous thrombosis and embolism: Secondary | ICD-10-CM | POA: Diagnosis not present

## 2019-02-18 DIAGNOSIS — R0789 Other chest pain: Secondary | ICD-10-CM | POA: Insufficient documentation

## 2019-02-18 DIAGNOSIS — Z794 Long term (current) use of insulin: Secondary | ICD-10-CM | POA: Insufficient documentation

## 2019-02-18 DIAGNOSIS — Z79899 Other long term (current) drug therapy: Secondary | ICD-10-CM | POA: Insufficient documentation

## 2019-02-18 DIAGNOSIS — R079 Chest pain, unspecified: Secondary | ICD-10-CM | POA: Diagnosis not present

## 2019-02-18 DIAGNOSIS — R531 Weakness: Secondary | ICD-10-CM | POA: Diagnosis not present

## 2019-02-18 DIAGNOSIS — E11649 Type 2 diabetes mellitus with hypoglycemia without coma: Secondary | ICD-10-CM

## 2019-02-18 DIAGNOSIS — Z20822 Contact with and (suspected) exposure to covid-19: Secondary | ICD-10-CM | POA: Diagnosis not present

## 2019-02-18 LAB — CBC
HCT: 38.6 % — ABNORMAL LOW (ref 39.0–52.0)
Hemoglobin: 12.4 g/dL — ABNORMAL LOW (ref 13.0–17.0)
MCH: 29.3 pg (ref 26.0–34.0)
MCHC: 32.1 g/dL (ref 30.0–36.0)
MCV: 91.3 fL (ref 80.0–100.0)
Platelets: 219 10*3/uL (ref 150–400)
RBC: 4.23 MIL/uL (ref 4.22–5.81)
RDW: 14.3 % (ref 11.5–15.5)
WBC: 8.4 10*3/uL (ref 4.0–10.5)
nRBC: 0 % (ref 0.0–0.2)

## 2019-02-18 LAB — DIFFERENTIAL
Abs Immature Granulocytes: 0.08 10*3/uL — ABNORMAL HIGH (ref 0.00–0.07)
Basophils Absolute: 0.1 10*3/uL (ref 0.0–0.1)
Basophils Relative: 1 %
Eosinophils Absolute: 0.1 10*3/uL (ref 0.0–0.5)
Eosinophils Relative: 1 %
Immature Granulocytes: 1 %
Lymphocytes Relative: 14 %
Lymphs Abs: 1.2 10*3/uL (ref 0.7–4.0)
Monocytes Absolute: 0.8 10*3/uL (ref 0.1–1.0)
Monocytes Relative: 9 %
Neutro Abs: 6.3 10*3/uL (ref 1.7–7.7)
Neutrophils Relative %: 74 %

## 2019-02-18 LAB — URINALYSIS, ROUTINE W REFLEX MICROSCOPIC
Bacteria, UA: NONE SEEN
Bilirubin Urine: NEGATIVE
Glucose, UA: >=500 mg/dL — AB
Hgb urine dipstick: NEGATIVE
Ketones, ur: NEGATIVE mg/dL
Leukocytes,Ua: NEGATIVE
Nitrite: NEGATIVE
Protein, ur: NEGATIVE mg/dL
Specific Gravity, Urine: 1.011 (ref 1.005–1.030)
pH: 5 (ref 5.0–8.0)

## 2019-02-18 LAB — PROTIME-INR
INR: 0.9 (ref 0.8–1.2)
Prothrombin Time: 12.2 seconds (ref 11.4–15.2)

## 2019-02-18 LAB — COMPREHENSIVE METABOLIC PANEL
ALT: 21 U/L (ref 0–44)
AST: 19 U/L (ref 15–41)
Albumin: 4.1 g/dL (ref 3.5–5.0)
Alkaline Phosphatase: 54 U/L (ref 38–126)
Anion gap: 9 (ref 5–15)
BUN: 28 mg/dL — ABNORMAL HIGH (ref 8–23)
CO2: 26 mmol/L (ref 22–32)
Calcium: 9.1 mg/dL (ref 8.9–10.3)
Chloride: 102 mmol/L (ref 98–111)
Creatinine, Ser: 1.06 mg/dL (ref 0.61–1.24)
GFR calc Af Amer: >60 mL/min (ref >60)
GFR calc non Af Amer: >60 mL/min (ref >60)
Glucose, Bld: 344 mg/dL — ABNORMAL HIGH (ref 70–99)
Potassium: 3.9 mmol/L (ref 3.5–5.1)
Sodium: 137 mmol/L (ref 135–145)
Total Bilirubin: 0.7 mg/dL (ref 0.3–1.2)
Total Protein: 7 g/dL (ref 6.5–8.1)

## 2019-02-18 LAB — GLUCOSE, CAPILLARY: Glucose-Capillary: 146 mg/dL — ABNORMAL HIGH (ref 70–99)

## 2019-02-18 LAB — TROPONIN I (HIGH SENSITIVITY)
Troponin I (High Sensitivity): 2 ng/L (ref <18)
Troponin I (High Sensitivity): 3 ng/L (ref <18)

## 2019-02-18 LAB — APTT: aPTT: 28 seconds (ref 24–36)

## 2019-02-18 MED ORDER — INSULIN ASPART 100 UNIT/ML ~~LOC~~ SOLN
8.0000 [IU] | Freq: Once | SUBCUTANEOUS | Status: AC
  Administered 2019-02-18: 8 [IU] via SUBCUTANEOUS
  Filled 2019-02-18: qty 1

## 2019-02-18 MED ORDER — PRAVASTATIN SODIUM 40 MG PO TABS
40.0000 mg | ORAL_TABLET | Freq: Every evening | ORAL | Status: DC
  Administered 2019-02-19: 40 mg via ORAL
  Filled 2019-02-18: qty 1

## 2019-02-18 MED ORDER — ONDANSETRON HCL 4 MG/2ML IJ SOLN: 4.0000 mg | Freq: Four times a day (QID) | INTRAMUSCULAR | Status: DC | PRN

## 2019-02-18 MED ORDER — HYDROCODONE-ACETAMINOPHEN 5-325 MG PO TABS: 1.0000 | ORAL_TABLET | Freq: Four times a day (QID) | ORAL | Status: DC | PRN

## 2019-02-18 MED ORDER — INSULIN ASPART 100 UNIT/ML ~~LOC~~ SOLN
0.0000 [IU] | Freq: Three times a day (TID) | SUBCUTANEOUS | Status: DC
  Administered 2019-02-19: 3 [IU] via SUBCUTANEOUS
  Administered 2019-02-19: 12:00:00 11 [IU] via SUBCUTANEOUS

## 2019-02-18 MED ORDER — CLOPIDOGREL BISULFATE 75 MG PO TABS
75.0000 mg | ORAL_TABLET | Freq: Every day | ORAL | Status: DC
  Administered 2019-02-19 (×2): 75 mg via ORAL
  Filled 2019-02-18 (×2): qty 1

## 2019-02-18 MED ORDER — ROPINIROLE HCL ER 4 MG PO TB24
4.0000 mg | ORAL_TABLET | Freq: Every day | ORAL | Status: DC
  Filled 2019-02-18 (×3): qty 1

## 2019-02-18 MED ORDER — AMLODIPINE BESYLATE 5 MG PO TABS
5.0000 mg | ORAL_TABLET | Freq: Every day | ORAL | Status: DC
  Administered 2019-02-19: 5 mg via ORAL
  Filled 2019-02-18: qty 1

## 2019-02-18 MED ORDER — ACETAMINOPHEN 325 MG PO TABS: 650.0000 mg | ORAL_TABLET | Freq: Four times a day (QID) | ORAL | Status: DC | PRN

## 2019-02-18 MED ORDER — GLIPIZIDE ER 5 MG PO TB24
10.0000 mg | ORAL_TABLET | Freq: Every day | ORAL | Status: DC
  Administered 2019-02-19: 09:00:00 10 mg via ORAL
  Filled 2019-02-18: qty 2

## 2019-02-18 MED ORDER — INSULIN REGULAR HUMAN (CONC) 500 UNIT/ML ~~LOC~~ SOPN
30.0000 [IU] | PEN_INJECTOR | Freq: Three times a day (TID) | SUBCUTANEOUS | Status: DC
  Administered 2019-02-19: 30 [IU] via SUBCUTANEOUS
  Filled 2019-02-18: qty 3

## 2019-02-18 MED ORDER — POLYETHYLENE GLYCOL 3350 17 G PO PACK: 17.0000 g | PACK | Freq: Every day | ORAL | Status: DC | PRN

## 2019-02-18 MED ORDER — FLUTICASONE FUROATE-VILANTEROL 200-25 MCG/INH IN AEPB: 1.0000 | INHALATION_SPRAY | Freq: Every day | RESPIRATORY_TRACT | Status: DC

## 2019-02-18 MED ORDER — ALBUTEROL SULFATE HFA 108 (90 BASE) MCG/ACT IN AERS: 2.0000 | INHALATION_SPRAY | Freq: Four times a day (QID) | RESPIRATORY_TRACT | Status: DC | PRN

## 2019-02-18 MED ORDER — LEVOTHYROXINE SODIUM 137 MCG PO TABS
137.0000 ug | ORAL_TABLET | Freq: Every day | ORAL | Status: DC
  Administered 2019-02-19: 05:00:00 137 ug via ORAL
  Filled 2019-02-18: qty 1

## 2019-02-18 MED ORDER — METOPROLOL SUCCINATE ER 50 MG PO TB24
50.0000 mg | ORAL_TABLET | Freq: Two times a day (BID) | ORAL | Status: DC
  Administered 2019-02-19 (×2): 50 mg via ORAL
  Filled 2019-02-18 (×2): qty 1

## 2019-02-18 MED ORDER — ASPIRIN 81 MG PO CHEW
81.0000 mg | CHEWABLE_TABLET | Freq: Every day | ORAL | Status: DC
  Administered 2019-02-19 (×2): 81 mg via ORAL
  Filled 2019-02-18 (×2): qty 1

## 2019-02-18 MED ORDER — ONDANSETRON HCL 4 MG PO TABS: 4.0000 mg | ORAL_TABLET | Freq: Four times a day (QID) | ORAL | Status: DC | PRN

## 2019-02-18 MED ORDER — INSULIN DEGLUDEC 100 UNIT/ML ~~LOC~~ SOPN: 40.0000 [IU] | PEN_INJECTOR | Freq: Every day | SUBCUTANEOUS | Status: DC

## 2019-02-18 MED ORDER — INSULIN ASPART 100 UNIT/ML ~~LOC~~ SOLN: 0.0000 [IU] | Freq: Every day | SUBCUTANEOUS | Status: DC

## 2019-02-18 MED ORDER — ACETAMINOPHEN 650 MG RE SUPP: 650.0000 mg | Freq: Four times a day (QID) | RECTAL | Status: DC | PRN

## 2019-02-18 NOTE — ED Provider Notes (Signed)
Pt signed out at shift change from [facility] PA-C  Woke Sunday with left upper and lower extremity weakness. CP intermittently over the past 5 days, sharp, lasting 45 minutes to an hour without sob, wheezing, not pleuritic, last episode occurring upon arrival here.  Currently chest jpain free.   Known CAD.Ct imaging negative for acute cva.  Pt reports being admitted the hospital years ago for weakness nad dizziness - negative cva workup, felt due to uncontrolled DM.  Pt with hear score of 4, pending second troponin.  Given cardiac hx and increased risks, overnight obs for cardiac rule out recommended.  Pt may also benefit from MRI brain imaging in am given dizziness sx and left sided weakness.   Cath hx per chart.  (Dr. Domenic Polite)  2011: cath showing 90% stenosis along small non-dominant RCA (too small for PCI). b. 01/2018: cath showing nonobstructive CAD with 60 to 70% proximal to mid nondominant RCA stenosis, 50% mid LAD and 40 to 50% OM1  Results for orders placed or performed during the hospital encounter of 02/18/19  Protime-INR  Result Value Ref Range   Prothrombin Time 12.2 11.4 - 15.2 seconds   INR 0.9 0.8 - 1.2  APTT  Result Value Ref Range   aPTT 28 24 - 36 seconds  CBC  Result Value Ref Range   WBC 8.4 4.0 - 10.5 K/uL   RBC 4.23 4.22 - 5.81 MIL/uL   Hemoglobin 12.4 (L) 13.0 - 17.0 g/dL   HCT 38.6 (L) 39.0 - 52.0 %   MCV 91.3 80.0 - 100.0 fL   MCH 29.3 26.0 - 34.0 pg   MCHC 32.1 30.0 - 36.0 g/dL   RDW 14.3 11.5 - 15.5 %   Platelets 219 150 - 400 K/uL   nRBC 0.0 0.0 - 0.2 %  Differential  Result Value Ref Range   Neutrophils Relative % 74 %   Neutro Abs 6.3 1.7 - 7.7 K/uL   Lymphocytes Relative 14 %   Lymphs Abs 1.2 0.7 - 4.0 K/uL   Monocytes Relative 9 %   Monocytes Absolute 0.8 0.1 - 1.0 K/uL   Eosinophils Relative 1 %   Eosinophils Absolute 0.1 0.0 - 0.5 K/uL   Basophils Relative 1 %   Basophils Absolute 0.1 0.0 - 0.1 K/uL   Immature Granulocytes 1 %   Abs  Immature Granulocytes 0.08 (H) 0.00 - 0.07 K/uL  Comprehensive metabolic panel  Result Value Ref Range   Sodium 137 135 - 145 mmol/L   Potassium 3.9 3.5 - 5.1 mmol/L   Chloride 102 98 - 111 mmol/L   CO2 26 22 - 32 mmol/L   Glucose, Bld 344 (H) 70 - 99 mg/dL   BUN 28 (H) 8 - 23 mg/dL   Creatinine, Ser 1.06 0.61 - 1.24 mg/dL   Calcium 9.1 8.9 - 10.3 mg/dL   Total Protein 7.0 6.5 - 8.1 g/dL   Albumin 4.1 3.5 - 5.0 g/dL   AST 19 15 - 41 U/L   ALT 21 0 - 44 U/L   Alkaline Phosphatase 54 38 - 126 U/L   Total Bilirubin 0.7 0.3 - 1.2 mg/dL   GFR calc non Af Amer >60 >60 mL/min   GFR calc Af Amer >60 >60 mL/min   Anion gap 9 5 - 15  Troponin I (High Sensitivity)  Result Value Ref Range   Troponin I (High Sensitivity) 2 <18 ng/L   CT HEAD WO CONTRAST  Result Date: 02/18/2019 CLINICAL DATA:  Dizziness EXAM:  CT HEAD WITHOUT CONTRAST TECHNIQUE: Contiguous axial images were obtained from the base of the skull through the vertex without intravenous contrast. COMPARISON:  2014 FINDINGS: Brain: There is no acute intracranial hemorrhage, mass-effect, or edema. Gray-white differentiation is preserved. There is no extra-axial fluid collection. Ventricles and sulci are within normal limits in size and configuration. Vascular: There is atherosclerotic calcification at the skull base. Skull: Calvarium is unremarkable. Sinuses/Orbits: Mild mucosal thickening. Bilateral lens replacements. Other: None. IMPRESSION: No acute intracranial abnormality. Electronically Signed   By: Macy Mis M.D.   On: 02/18/2019 17:03   DG Chest Portable 1 View  Result Date: 02/18/2019 CLINICAL DATA:  Weakness, dizziness EXAM: PORTABLE CHEST 1 VIEW COMPARISON:  01/14/2018 FINDINGS: The heart size and mediastinal contours are stable. No focal airspace consolidation, pleural effusion, or pneumothorax. The visualized skeletal structures are unremarkable. IMPRESSION: No acute cardiopulmonary findings. Electronically Signed   By:  Davina Poke D.O.   On: 02/18/2019 16:45     Discussed with Dr. Denton Brick who accepts pt for admission.   Evalee Jefferson, PA-C 02/18/19 2000    Margette Fast, MD 02/19/19 2129

## 2019-02-18 NOTE — ED Triage Notes (Addendum)
Pt reports that he woke up Sunday morning with dizziness. Worse with moving head back and forth and even just sitting makes him dizzy. Dizziness continues Reports left sided head pain for at least a month and pains shooting up temporal area. Pt also reports unsteady gait Pt sent by Dr Wolfgang Phoenix.

## 2019-02-18 NOTE — Telephone Encounter (Signed)
Pt called and states he has had dizziness for the past 2 -3 days. It has been constant and has had a headache with it. Headache is like a squeezing pain in his head. Consult with dr Richardson Landry and with his history and symptoms he recommended to go to ED right away. Pt states he will have his wife take him to ED

## 2019-02-18 NOTE — ED Provider Notes (Signed)
[facility] Provider Note   CSN: [id number] Arrival date & time: 02/18/19  1508     History Chief Complaint  Patient presents with  . Dizziness    Paul Baker is a 69 y.o. male.  HPI      Paul Baker is a 69 y.o. male with past medical history of coronary artery disease, asthma, hypertension, and type 2 diabetes who presents to the Emergency Department complaining of dizziness upon waking 2 days ago.  He describes a sensation of room spinning and movement that worsens with position change and he states he has been unsteady while walking.  He states the symptoms were severe at onset and have improved somewhat since that time but has not resolved.  He reports having similar symptoms that required admission 6 or 7 years ago.  Patient was unclear of diagnosis at that time.  He contacted his PCPs office earlier today and was advised to come to the emergency department for further evaluation.  He also notes having some left-sided chest pain today that has been intermittent.  He describes this pain as sharp stabbing pain.  Chest pain is not been associated with shortness of breath, nausea, vomiting, or diaphoresis.  He denies fever, chills, dysuria.  No hx of stokes.  No new medications or recent medication changes.    Past Medical History:  Diagnosis Date  . Arthritis   . Asthma   . Colon polyp   . Coronary atherosclerosis of native coronary artery    a. 2011: cath showing 90% stenosis along small non-dominant RCA (too small for PCI). b. 01/2018: cath showing nonobstructive CAD with 60 to 70% proximal to mid nondominant RCA stenosis, 50% mid LAD and 40 to 50% OM1  . DVT (deep venous thrombosis) ([place]) 2005   Right arm  . Essential hypertension, benign   . Headache   . History of transfusion   . Hypersomnia    CPAP of 16 cm, diagnosed with AHI of 60 in 2012,[place] 21- narcolepsy?  [name] Hypothyroidism   . Mixed hyperlipidemia   . Morbid obesity ([place])     . MRSA (methicillin resistant staph aureus) culture positive    08/2012  . Narcolepsy   . OSA (obstructive sleep apnea)    CPAP  . Osteoarthritis   . Pneumonia   . Psoriasis   . Pulmonary embolism ([place]) 2004  . RLS (restless legs syndrome)   . Rotator cuff disorder    Left  . Septic arthritis of knee, left ([place])   . Skin cancer, basal cell   . Type 2 diabetes mellitus [facility])     Patient Active Problem List   Diagnosis Date Noted  . Type 2 diabetes mellitus with hypoglycemia without coma ([place]) 03/31/2017  . AKI (acute kidney injury) ([place]) 03/30/2017  . Right patellar tendon rupture 12/04/2014  . Mixed hyperlipidemia 11/25/2014  . Other specified hypothyroidism 11/25/2014  . S/P right TKA 03/23/2014  . S/P knee replacement 03/23/2014  . Spinal stenosis of cervicothoracic region 10/08/2013  . RLS (restless legs syndrome) 10/08/2013  . DM type 2 causing vascular disease ([place]) 10/08/2013  . Preoperative cardiovascular examination 08/14/2013  . Sleep apnea with use of continuous positive airway pressure (CPAP) 04/02/2013  . 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/02/2013  . Diabetic neuropathy ([place]) 07/23/2012  . Obesity, morbid ([place]) 05/01/2012  . Hypersomnia, persistent 05/01/2012  . OSA on CPAP 04/19/2012  . Chest pain 04/19/2012  . Essential hypertension, benign 12/16/2009  .  CORONARY ATHEROSCLEROSIS NATIVE CORONARY ARTERY 12/16/2009  . Accelerating angina ([place]) 11/30/2009    Past Surgical History:  Procedure Laterality Date  . BACK SURGERY    . CATARACT EXTRACTION Bilateral   . COLONOSCOPY    . CYST REMOVAL TRUNK     from back  . EYE SURGERY Left 2016   laser to left eye  . HIP SURGERY     bone removed from both sides of hip  . KNEE ARTHROTOMY Right 12/04/2014   Procedure: KNEE ARTHROTOMY PATELLA LIGAMENT RECONSTRUSION AND REPAIR RIGHT KNEE;  Surgeon: Paralee Cancel, MD;  Location: [facility];  Service: Orthopedics;  Laterality: Right;  . KNEE SURGERY     X 25  TIMES  . LEFT HEART CATH AND CORONARY ANGIOGRAPHY N/A 01/17/2018   Procedure: LEFT HEART CATH AND CORONARY ANGIOGRAPHY;  Surgeon: Belva Crome, MD;  Location: [facility];  Service: Cardiovascular;  Laterality: N/A;  . LUMBAR DISC SURGERY     Left L3, L4, L5 discecotomy with decompression of L4 root  . TONSILLECTOMY    . TOTAL KNEE ARTHROPLASTY  2003   LEFT  . TOTAL KNEE ARTHROPLASTY Right 03/23/2014   Procedure: RIGHT TOTAL KNEE ARTHROPLASTY AND REMOVAL RIGHT TIBIAL  DEEP IMPLANT STAPLE;  Surgeon: Paralee Cancel, MD;  Location: [facility];  Service: Orthopedics;  Laterality: Right;  . TOTAL KNEE REVISION  2005   LEFT  . WRIST SURGERY         Family History  Problem Relation Age of Onset  . Hypertension Father   . Heart attack Father   . Kidney Stones Father   . Seizures Grandchild   . Narcolepsy Grandchild   . Diabetes Sister     Social History   Tobacco Use  . Smoking status: Former Smoker    Packs/day: 1.50    Years: 10.00    Pack years: 15.00    Types: Cigarettes    Start date: 06/19/1967    Quit date: 01/03/1995    Years since quitting: 24.1  . Smokeless tobacco: Never Used  Substance Use Topics  . Alcohol use: No    Alcohol/week: 0.0 standard drinks    Comment: quit drinking in 07/86  . Drug use: No    Home Medications Prior to Admission medications   Medication Sig Start Date End Date Taking? Authorizing Provider  albuterol (VENTOLIN HFA) 108 (90 Base) MCG/ACT inhaler Inhale 2 puffs into the lungs every 6 (six) hours as needed for wheezing or shortness of breath. 12/12/18   [name], MD  amLODipine (NORVASC) 5 MG tablet Take 1 tablet (5 mg total) by mouth daily. 05/21/18   Satira Sark, MD  B-D ULTRAFINE III SHORT PEN 31G X 8 MM MISC USING FIVE TIMES DAILY. 11/06/16   Cassandria Anger, MD  Continuous Blood Gluc Receiver (DEXCOM G6 RECEIVER) DEVI 1 Piece by Does not apply route as needed. 04/13/18   Cassandria Anger, MD  Continuous Blood  Gluc Sensor (DEXCOM G6 SENSOR) MISC 4 Pieces by Does not apply route once a week. 05/15/18   Cassandria Anger, MD  EUTHYROX 137 MCG tablet TAKE 1 TABLET BY MOUTH ONCE DAILY BEFORE BREAKFAST 02/04/19   Cassandria Anger, MD  fenofibrate 160 MG tablet TAKE 1 TABLET BY MOUTH AT BEDTIME - 11/08/17   Satira Sark, MD  fluticasone furoate-vilanterol (BREO ELLIPTA) 200-25 MCG/INH AEPB Inhale 1 puff into the lungs daily. Patient not taking: Reported on 02/04/2019 01/13/19   Chesley Mires, MD  furosemide (LASIX) 40 MG tablet Take 40 mg by mouth. Take 40 mg alternating with 80 mg Daily    [provider]  glipiZIDE (GLUCOTROL XL) 10 MG 24 hr tablet Take 1 tablet (10 mg total) by mouth daily with breakfast. 01/14/19   Nida, Marella Chimes, MD  insulin regular human CONCENTRATED (HUMULIN R U-500 KWIKPEN) 500 UNIT/ML kwikpen Inject 50 Units into the skin 3 (three) times daily with meals. 02/12/19   Cassandria Anger, MD  Insulin Syringe-Needle U-100 (INSULIN SYRINGE 1CC/31GX5/16") 31G X 5/16" 1 ML MISC 1 each by Does not apply route 2 (two) times daily. 04/24/17   Cassandria Anger, MD  isosorbide mononitrate (IMDUR) 60 MG 24 hr tablet Take 60 mg ( 1 Tablet)  in the AM and Take 30 mg ( 1/2 Tablet)  in the PM 06/20/18   Satira Sark, MD  loratadine (CLARITIN) 10 MG tablet Take 10 mg by mouth daily as needed for allergies.     [provider]  losartan (COZAAR) 25 MG tablet Take 25 mg by mouth daily. 12/21/17   [provider]  metFORMIN (GLUCOPHAGE) 500 MG tablet TAKE 1 TABLET BY MOUTH TWICE DAILY WITH MEALS 09/26/18   Nida, Marella Chimes, MD  metoprolol succinate (TOPROL-XL) 50 MG 24 hr tablet Take 1 tablet by mouth twice daily 07/15/18   Satira Sark, MD  modafinil (PROVIGIL) 200 MG tablet Take 1 tablet by mouth once daily 10/02/18   Dohmeier, Asencion Partridge, MD  nitroGLYCERIN (NITROSTAT) 0.4 MG SL tablet DISSOLVE 1 TABLET UNDER TONGUE EVERY 5 MINUTES UP TO [place]. IF NO RELIEF CALL 911. 11/08/17   Satira Sark, MD  ONE TOUCH ULTRA TEST test strip TEST BLOOD SUGAR UP TO 4 TIMES DAILY. 10/10/16   Cassandria Anger, MD  ONETOUCH DELICA LANCETS 99991111 MISC USE AS DIRECTED TO TEST BLOOD SUGAR 4 TIMES DAILY. 10/10/16   Cassandria Anger, MD  pravastatin (PRAVACHOL) 20 MG tablet Take 1 tablet (20 mg total) by mouth every evening. 08/05/18 11/03/18  Satira Sark, MD  rOPINIRole (REQUIP XL) 4 MG 24 hr tablet Take 1 tablet (4 mg total) by mouth at bedtime. 11/25/18   Ward Givens, NP  rOPINIRole (REQUIP) 4 MG tablet Take 0.5 tablets (2 mg total) by mouth 2 (two) times daily. 09/02/18   Ward Givens, NP  sodium chloride (OCEAN) 0.65 % SOLN nasal spray Place 1-2 sprays into both nostrils daily as needed for congestion.    [provider]  Vitamin D, Ergocalciferol, (DRISDOL) 1.25 MG (50000 UNIT) CAPS capsule Take 1 capsule by mouth once a week 02/04/19   Cassandria Anger, MD    Allergies    Cardizem [diltiazem hcl], Cardura [doxazosin mesylate], Lipitor [atorvastatin], Paxil [paroxetine hcl], Adhesive [tape], Codeine, and Gabapentin  Review of Systems   Review of Systems  Constitutional: Negative for chills and fever.  HENT: Negative for trouble swallowing.   Respiratory: Negative for chest tightness, shortness of breath and wheezing.   Cardiovascular: Positive for chest pain. Negative for leg swelling.  Gastrointestinal: Negative for abdominal pain, diarrhea, nausea and vomiting.  Genitourinary: Negative for difficulty urinating and dysuria.  Musculoskeletal: Negative for arthralgias and neck pain.  Skin: Negative for color change and rash.  Neurological: Positive for dizziness. Negative for seizures, syncope and facial asymmetry.  Psychiatric/Behavioral: Negative for confusion.    Physical Exam Updated Vital Signs BP (!) 167/89 (BP Location: Right Wrist)   Pulse 83   Temp 98.3  F (36.8 C) (Oral)   Resp 18   Ht 5'  5" (1.651 m)   Wt 104.3 kg   SpO2 97%   BMI 38.27 kg/m   Physical Exam Vitals and nursing note reviewed.  Constitutional:      Appearance: Normal appearance.  HENT:     Head: Normocephalic and atraumatic.     Mouth/Throat:     Mouth: Mucous membranes are moist.     Pharynx: Oropharynx is clear.  Eyes:     Extraocular Movements: Extraocular movements intact.     Pupils: Pupils are equal, round, and reactive to light.  Cardiovascular:     Rate and Rhythm: Normal rate and regular rhythm.     Pulses: Normal pulses.  Pulmonary:     Effort: Pulmonary effort is normal.     Breath sounds: Normal breath sounds. No wheezing.  Chest:     Chest wall: No tenderness.  Abdominal:     General: There is no distension.     Palpations: Abdomen is soft.     Tenderness: There is no abdominal tenderness.  Musculoskeletal:        General: Normal range of motion.     Cervical back: Normal range of motion. No tenderness.     Right lower leg: No edema.     Left lower leg: No edema.  Lymphadenopathy:     Cervical: No cervical adenopathy.  Skin:    General: Skin is warm.     Capillary Refill: Capillary refill takes less than 2 seconds.     Findings: No rash.  Neurological:     Mental Status: He is alert and oriented to person, place, and time.     GCS: GCS eye subscore is 4. GCS verbal subscore is 5. GCS motor subscore is 6.     Sensory: Sensation is intact.     Motor: Motor function is intact. No pronator drift.     Coordination: Coordination is intact.     Comments: CN II-XII grossly intact.  No dysarthria or aphasia,  No facial weakness noted.  There is a subtle left upper and lower extremity weakness, no pronator drift.  nml finger nose and heel-shin testing.       ED Results / Procedures / Treatments   Labs (all labs ordered are listed, but only abnormal results are displayed) Labs Reviewed  CBC - Abnormal; Notable for the following components:      Result Value   Hemoglobin 12.4  (*)    HCT 38.6 (*)    All other components within normal limits  DIFFERENTIAL - Abnormal; Notable for the following components:   Abs Immature Granulocytes 0.08 (*)    All other components within normal limits  PROTIME-INR  APTT  COMPREHENSIVE METABOLIC PANEL  URINALYSIS, ROUTINE W REFLEX MICROSCOPIC  TROPONIN I (HIGH SENSITIVITY)    EKG EKG Interpretation  Date/Time:  Tuesday February 18 2019 16:36:17 EST Ventricular Rate:  78 PR Interval:    QRS Duration: 96 QT Interval:  403 QTC Calculation: 459 R Axis:   -22 Text Interpretation: Sinus rhythm Borderline left axis deviation Low voltage, precordial leads Baseline wander in lead(s) V4 No STEMI Confirmed by Nanda Quinton [phone]) on 02/18/2019 4:38:21 PM   Radiology CT HEAD WO CONTRAST  Result Date: 02/18/2019 CLINICAL DATA:  Dizziness EXAM: CT HEAD WITHOUT CONTRAST TECHNIQUE: Contiguous axial images were obtained from the base of the skull through the vertex without intravenous contrast. COMPARISON:  2014 FINDINGS: Brain: There is no acute intracranial  hemorrhage, mass-effect, or edema. Gray-white differentiation is preserved. There is no extra-axial fluid collection. Ventricles and sulci are within normal limits in size and configuration. Vascular: There is atherosclerotic calcification at the skull base. Skull: Calvarium is unremarkable. Sinuses/Orbits: Mild mucosal thickening. Bilateral lens replacements. Other: None. IMPRESSION: No acute intracranial abnormality. Electronically Signed   By: Macy Mis M.D.   On: 02/18/2019 17:03   DG Chest Portable 1 View  Result Date: 02/18/2019 CLINICAL DATA:  Weakness, dizziness EXAM: PORTABLE CHEST 1 VIEW COMPARISON:  01/14/2018 FINDINGS: The heart size and mediastinal contours are stable. No focal airspace consolidation, pleural effusion, or pneumothorax. The visualized skeletal structures are unremarkable. IMPRESSION: No acute cardiopulmonary findings. Electronically Signed   By: Davina Poke D.O.   On: 02/18/2019 16:45    Procedures Procedures (including critical care time)  Medications Ordered in ED Medications - No data to display  ED Course  I have reviewed the triage vital signs and the nursing notes.  Pertinent labs & imaging results that were available during my care of the patient were reviewed by me and considered in my medical decision making (see chart for details).    MDM Rules/Calculators/A&P                      Pt with dizziness and ataxia x 2 days.  Pt also has subtle left sided weakness of unknown duration, out of window for code stroke. Has hx of significant heart disease as well.    Completion of work up pending at end of shift.  Discussed with Evalee Jefferson, PA-C who assumes care.  Pt will likely need admission for further evaluation of dizziness to include non emergent MRI.  Diff dx at this point would also include cardiac process, troponin still pending   Final Clinical Impression(s) / ED Diagnoses Final diagnoses:  Dizziness    Rx / DC Orders ED Discharge Orders    None       Bufford Lope 02/18/19 2201    Margette Fast, MD 02/19/19 2129

## 2019-02-18 NOTE — H&P (Signed)
History and Physical    Paul Baker [id number] DOB: 1950/07/02 DOA: 02/18/2019  PCP: [name], MD   Patient coming from: Home  I have personally briefly reviewed patient's old medical records in [place]  Chief Complaint: Dizziness, left-sided weakness  HPI: Paul Baker is a 69 y.o. male with medical history significant for coronary artery disease, hypertension, diabetes mellitus, restless leg syndrome, DVT. Patient presented to the ED with complaints of dizziness that started 2 days ago- 2/14.  Dizziness started in the morning when he woke up from bed.  Present when lying down and worse when he tries to get up.  Reports headache on bilateral sides of his head-by his temples.  He also reports onset of weakness of his left upper and lower extremity.  Dizziness and weakness but persistent, not worse and not better.  He denies facial asymmetry, no voice change, no change in vision.  No history of strokes.  He takes aspirin 81 mg daily but ran out about a week ago otherwise he has been compliant.  He reports onset of chest pain 3 days ago, that started while he was driving his car.  Chest pain is left-sided, described as stabbing.  Chest pain is unrelated to activity or rest.  Chest pain is sometimes associated with difficulty breathing, no nausea no vomiting.  ED Course: Stable vitals.  Head CT negative for acute abnormality.. Hs troponin unremarkable.  Hospitalist to admit for chest pain and possible stroke.  Review of Systems: As per HPI all other systems reviewed and negative.  Past Medical History:  Diagnosis Date  . Arthritis   . Asthma   . Colon polyp   . Coronary atherosclerosis of native coronary artery    a. 2011: cath showing 90% stenosis along small non-dominant RCA (too small for PCI). b. 01/2018: cath showing nonobstructive CAD with 60 to 70% proximal to mid nondominant RCA stenosis, 50% mid LAD and 40 to 50% OM1  . DVT (deep venous thrombosis)  ([place]) 2005   Right arm  . Essential hypertension, benign   . Headache   . History of transfusion   . Hypersomnia    CPAP of 16 cm, diagnosed with AHI of 60 in 2012,epworth 21- narcolepsy?  [name] Hypothyroidism   . Mixed hyperlipidemia   . Morbid obesity ([place])   . MRSA (methicillin resistant staph aureus) culture positive    08/2012  . Narcolepsy   . OSA (obstructive sleep apnea)    CPAP  . Osteoarthritis   . Pneumonia   . Psoriasis   . Pulmonary embolism ([place]) 2004  . RLS (restless legs syndrome)   . Rotator cuff disorder    Left  . Septic arthritis of knee, left ([place])   . Skin cancer, basal cell   . Type 2 diabetes mellitus ([place])     Past Surgical History:  Procedure Laterality Date  . BACK SURGERY    . CATARACT EXTRACTION Bilateral   . COLONOSCOPY    . CYST REMOVAL TRUNK     from back  . EYE SURGERY Left 2016   laser to left eye  . HIP SURGERY     bone removed from both sides of hip  . KNEE ARTHROTOMY Right 12/04/2014   Procedure: KNEE ARTHROTOMY PATELLA LIGAMENT RECONSTRUSION AND REPAIR RIGHT KNEE;  Surgeon: Paralee Cancel, MD;  Location: [facility];  Service: Orthopedics;  Laterality: Right;  . KNEE SURGERY     X 25 TIMES  . LEFT HEART CATH  AND CORONARY ANGIOGRAPHY N/A 01/17/2018   Procedure: LEFT HEART CATH AND CORONARY ANGIOGRAPHY;  Surgeon: Belva Crome, MD;  Location: [facility];  Service: Cardiovascular;  Laterality: N/A;  . LUMBAR DISC SURGERY     Left L3, L4, L5 discecotomy with decompression of L4 root  . TONSILLECTOMY    . TOTAL KNEE ARTHROPLASTY  2003   LEFT  . TOTAL KNEE ARTHROPLASTY Right 03/23/2014   Procedure: RIGHT TOTAL KNEE ARTHROPLASTY AND REMOVAL RIGHT TIBIAL  DEEP IMPLANT STAPLE;  Surgeon: Paralee Cancel, MD;  Location: [facility];  Service: Orthopedics;  Laterality: Right;  . TOTAL KNEE REVISION  2005   LEFT  . WRIST SURGERY       reports that he quit smoking about 24 years ago. His smoking use included cigarettes. He started smoking about 51  years ago. He has a 15.00 pack-year smoking history. He has never used smokeless tobacco. He reports that he does not drink alcohol or use drugs.  Allergies  Allergen Reactions  . Cardizem [Diltiazem Hcl]     Edema   . Cardura [Doxazosin Mesylate]     Headaches / cramps  . Lipitor [Atorvastatin] Other (See Comments)    Leg cramps  . Paxil [Paroxetine Hcl]     Unknown reaction   . Adhesive [Tape] Rash    Pulls off skin  . Codeine Rash and Other (See Comments)    Headache   . Gabapentin Rash    Family History  Problem Relation Age of Onset  . Hypertension Father   . Heart attack Father   . Kidney Stones Father   . Seizures Grandchild   . Narcolepsy Grandchild   . Diabetes Sister     Prior to Admission medications   Medication Sig Start Date End Date Taking? Authorizing Provider  albuterol (VENTOLIN HFA) 108 (90 Base) MCG/ACT inhaler Inhale 2 puffs into the lungs every 6 (six) hours as needed for wheezing or shortness of breath. 12/12/18   [name], MD  amLODipine (NORVASC) 5 MG tablet Take 1 tablet (5 mg total) by mouth daily. 05/21/18   Satira Sark, MD  B-D ULTRAFINE III SHORT PEN 31G X 8 MM MISC USING FIVE TIMES DAILY. 11/06/16   Cassandria Anger, MD  Continuous Blood Gluc Receiver (DEXCOM G6 RECEIVER) DEVI 1 Piece by Does not apply route as needed. 04/13/18   Cassandria Anger, MD  Continuous Blood Gluc Sensor (DEXCOM G6 SENSOR) MISC 4 Pieces by Does not apply route once a week. 05/15/18   Cassandria Anger, MD  EUTHYROX 137 MCG tablet TAKE 1 TABLET BY MOUTH ONCE DAILY BEFORE BREAKFAST 02/04/19   Cassandria Anger, MD  fenofibrate 160 MG tablet TAKE 1 TABLET BY MOUTH AT BEDTIME - 11/08/17   Satira Sark, MD  fluticasone furoate-vilanterol (BREO ELLIPTA) 200-25 MCG/INH AEPB Inhale 1 puff into the lungs daily. Patient not taking: Reported on 02/04/2019 01/13/19   Chesley Mires, MD  furosemide (LASIX) 40 MG tablet Take 40 mg by mouth. Take 40 mg  alternating with 80 mg Daily    [provider]  glipiZIDE (GLUCOTROL XL) 10 MG 24 hr tablet Take 1 tablet (10 mg total) by mouth daily with breakfast. 01/14/19   Nida, Marella Chimes, MD  insulin regular human CONCENTRATED (HUMULIN R U-500 KWIKPEN) 500 UNIT/ML kwikpen Inject 50 Units into the skin 3 (three) times daily with meals. 02/12/19   Cassandria Anger, MD  Insulin Syringe-Needle U-100 (INSULIN SYRINGE 1CC/31GX5/16") 31G X  5/16" 1 ML MISC 1 each by Does not apply route 2 (two) times daily. 04/24/17   Cassandria Anger, MD  isosorbide mononitrate (IMDUR) 60 MG 24 hr tablet Take 60 mg ( 1 Tablet)  in the AM and Take 30 mg ( 1/2 Tablet)  in the PM 06/20/18   Satira Sark, MD  loratadine (CLARITIN) 10 MG tablet Take 10 mg by mouth daily as needed for allergies.     [provider]  losartan (COZAAR) 25 MG tablet Take 25 mg by mouth daily. 12/21/17   [provider]  metFORMIN (GLUCOPHAGE) 500 MG tablet TAKE 1 TABLET BY MOUTH TWICE DAILY WITH MEALS 09/26/18   Nida, Marella Chimes, MD  metoprolol succinate (TOPROL-XL) 50 MG 24 hr tablet Take 1 tablet by mouth twice daily 07/15/18   Satira Sark, MD  modafinil (PROVIGIL) 200 MG tablet Take 1 tablet by mouth once daily 10/02/18   Dohmeier, Asencion Partridge, MD  nitroGLYCERIN (NITROSTAT) 0.4 MG SL tablet DISSOLVE 1 TABLET UNDER TONGUE EVERY 5 MINUTES UP TO [place]. IF NO RELIEF CALL 911. 11/08/17   Satira Sark, MD  ONE TOUCH ULTRA TEST test strip TEST BLOOD SUGAR UP TO 4 TIMES DAILY. 10/10/16   Cassandria Anger, MD  ONETOUCH DELICA LANCETS [id number] MISC USE AS DIRECTED TO TEST BLOOD SUGAR 4 TIMES DAILY. 10/10/16   Cassandria Anger, MD  pravastatin (PRAVACHOL) 20 MG tablet Take 1 tablet (20 mg total) by mouth every evening. 08/05/18 11/03/18  Satira Sark, MD  rOPINIRole (REQUIP XL) 4 MG 24 hr tablet Take 1 tablet (4 mg total) by mouth at bedtime. 11/25/18   Ward Givens, NP  rOPINIRole  (REQUIP) 4 MG tablet Take 0.5 tablets (2 mg total) by mouth 2 (two) times daily. 09/02/18   Ward Givens, NP  sodium chloride (OCEAN) 0.65 % SOLN nasal spray Place 1-2 sprays into both nostrils daily as needed for congestion.    [provider]  Vitamin D, Ergocalciferol, (DRISDOL) 1.25 MG (50000 UNIT) CAPS capsule Take 1 capsule by mouth once a week 02/04/19   Cassandria Anger, MD    Physical Exam: Vitals:   02/18/19 2015 02/18/19 2030 02/18/19 2106 02/18/19 2226  BP:  129/73 (!) 143/81 134/86  Pulse: 71  72 69  Resp: 14     Temp:   98.2 F (36.8 C)   TempSrc:   Oral   SpO2: 96%  97% 96%  Weight:      Height:        Constitutional: NAD, calm, comfortable Vitals:   02/18/19 2015 02/18/19 2030 02/18/19 2106 02/18/19 2226  BP:  129/73 (!) 143/81 134/86  Pulse: 71  72 69  Resp: 14     Temp:   98.2 F (36.8 C)   TempSrc:   Oral   SpO2: 96%  97% 96%  Weight:      Height:       Eyes: PERRL, lids and conjunctivae normal ENMT: Mucous membranes are moist. Posterior pharynx clear of any exudate or lesions.  Neck: normal, supple, no masses, no thyromegaly Respiratory: Normal respiratory effort. No accessory muscle use.  Cardiovascular:  No extremity edema. 2+ pedal pulses. No carotid bruits.  Abdomen: no tenderness, no masses palpated. No hepatosplenomegaly. Bowel sounds positive.  Musculoskeletal: no clubbing / cyanosis. No joint deformity upper and lower extremities. Good ROM, no contractures. Normal muscle tone.  Skin: no rashes, lesions, ulcers. No induration Neurologic: Cranial nerves II through XII  intact, decreased sensation on left upper and lower extremity and left side of face, slight pronator drift to left upper extremity.  Strength 4+/5 left upper and lower extremity.  Normal finger-to-nose test bilaterally. Psychiatric: Normal judgment and insight. Alert and oriented x 3. Normal mood.   Labs on Admission: I have personally reviewed following labs and imaging  studies  CBC: Recent Labs  Lab 02/18/19 1607  WBC 8.4  NEUTROABS 6.3  HGB 12.4*  HCT 38.6*  MCV 91.3  PLT [id number]   Basic Metabolic Panel: Recent Labs  Lab 02/18/19 1607  NA 137  K 3.9  CL 102  CO2 26  GLUCOSE 344*  BUN 28*  CREATININE 1.06  CALCIUM 9.1   Liver Function Tests: Recent Labs  Lab 02/18/19 1607  AST 19  ALT 21  ALKPHOS 54  BILITOT 0.7  PROT 7.0  ALBUMIN 4.1   Coagulation Profile: Recent Labs  Lab 02/18/19 1607  INR 0.9   CBG: Recent Labs  Lab 02/18/19 2226  GLUCAP 146*   Urine analysis:    Component Value Date/Time   COLORURINE YELLOW 02/18/2019 1600   APPEARANCEUR CLEAR 02/18/2019 1600   LABSPEC 1.011 02/18/2019 1600   PHURINE 5.0 02/18/2019 1600   GLUCOSEU >=500 (A) 02/18/2019 1600   HGBUR NEGATIVE 02/18/2019 1600   BILIRUBINUR NEGATIVE 02/18/2019 1600   KETONESUR NEGATIVE 02/18/2019 1600   PROTEINUR NEGATIVE 02/18/2019 1600   UROBILINOGEN 0.2 03/13/2014 1149   NITRITE NEGATIVE 02/18/2019 1600   LEUKOCYTESUR NEGATIVE 02/18/2019 1600    Radiological Exams on Admission: CT HEAD WO CONTRAST  Result Date: 02/18/2019 CLINICAL DATA:  Dizziness EXAM: CT HEAD WITHOUT CONTRAST TECHNIQUE: Contiguous axial images were obtained from the base of the skull through the vertex without intravenous contrast. COMPARISON:  2014 FINDINGS: Brain: There is no acute intracranial hemorrhage, mass-effect, or edema. Gray-white differentiation is preserved. There is no extra-axial fluid collection. Ventricles and sulci are within normal limits in size and configuration. Vascular: There is atherosclerotic calcification at the skull base. Skull: Calvarium is unremarkable. Sinuses/Orbits: Mild mucosal thickening. Bilateral lens replacements. Other: None. IMPRESSION: No acute intracranial abnormality. Electronically Signed   By: Macy Mis M.D.   On: 02/18/2019 17:03   DG Chest Portable 1 View  Result Date: 02/18/2019 CLINICAL DATA:  Weakness, dizziness EXAM:  PORTABLE CHEST 1 VIEW COMPARISON:  01/14/2018 FINDINGS: The heart size and mediastinal contours are stable. No focal airspace consolidation, pleural effusion, or pneumothorax. The visualized skeletal structures are unremarkable. IMPRESSION: No acute cardiopulmonary findings. Electronically Signed   By: Davina Poke D.O.   On: 02/18/2019 16:45    EKG: Independently reviewed.  Sinus rhythm, QTC 459.  No significant change from prior.  Assessment/Plan Active Problems:   Dizziness   Dizziness, left-sided weakness- both persistent, slight left pronator drift, differentials stroke.  Head CT negative for acute abnormality.  Compliant with aspirin. NIH score of 2.  Also reports intermittent bilat temporal headache. -MRI brain, MRA head -Carotid Dopplers bilateral -Echocardiogram -PT OT consult - HGbA1c, lipid panel a.m -Neurochecks -Will continue home aspirin 81 mg daily and add Plavix 75 mg daily, pending MRI findings -Resume home pravastatin at increased dose 20 > 40mg  (atorvastatin allergy listed as leg cramps) -Neurology consult in a.m. -Hydrocodone as needed  Chest pain with coronary artery disease history- atypical. Hs troponin 2 > 3.  EKG unremarkable.  Last cardiac cath 01/2018 shows nonobstructive coronary artery disease, 60 to 70% proximal to mid nondominant right coronary, 50% mid LAD and 40 to  50% first obtuse marginal narrowing.  Aggressive risk factor modification recommended. -EKG a.m. -Follow-up echo  Diabetes mellitus-random glucose 344.  Patient was previously on Tresiba, he was switched over to Humulin U to start tomorrow, at 50 units 3 times daily. -Tresiba not available on formulary, will start Humulin U at reduced dose 30 units 3 times daily - SSI- M -Hold home Metformin - hgba1c  Restless leg syndrome -Resume home ropinirole  Hypertension-stable. - > 48 hours since onset of symptoms, resume home Norvasc 5 mg, metoprolol, Imdur  Asthma-stable -Resume home  bronchodilators   DVT prophylaxis: SCDs for now pending MRI findings Code Status: Full code Family Communication: None at bedside Disposition Plan: 1 to 2 days Consults called: Neurology Admission status: Observation, telemetry   Bethena Roys MD [facility]  02/18/2019, 11:06 PM

## 2019-02-19 ENCOUNTER — Observation Stay (HOSPITAL_COMMUNITY): Payer: Medicare HMO

## 2019-02-19 ENCOUNTER — Observation Stay (HOSPITAL_BASED_OUTPATIENT_CLINIC_OR_DEPARTMENT_OTHER): Payer: Medicare HMO

## 2019-02-19 DIAGNOSIS — I6602 Occlusion and stenosis of left middle cerebral artery: Secondary | ICD-10-CM | POA: Diagnosis not present

## 2019-02-19 DIAGNOSIS — E782 Mixed hyperlipidemia: Secondary | ICD-10-CM | POA: Diagnosis not present

## 2019-02-19 DIAGNOSIS — Z9861 Coronary angioplasty status: Secondary | ICD-10-CM | POA: Diagnosis not present

## 2019-02-19 DIAGNOSIS — J45909 Unspecified asthma, uncomplicated: Secondary | ICD-10-CM | POA: Diagnosis not present

## 2019-02-19 DIAGNOSIS — E119 Type 2 diabetes mellitus without complications: Secondary | ICD-10-CM | POA: Diagnosis not present

## 2019-02-19 DIAGNOSIS — I6389 Other cerebral infarction: Secondary | ICD-10-CM

## 2019-02-19 DIAGNOSIS — R0789 Other chest pain: Secondary | ICD-10-CM | POA: Diagnosis not present

## 2019-02-19 DIAGNOSIS — Z20822 Contact with and (suspected) exposure to covid-19: Secondary | ICD-10-CM | POA: Diagnosis not present

## 2019-02-19 DIAGNOSIS — R42 Dizziness and giddiness: Secondary | ICD-10-CM | POA: Diagnosis not present

## 2019-02-19 DIAGNOSIS — I10 Essential (primary) hypertension: Secondary | ICD-10-CM | POA: Diagnosis not present

## 2019-02-19 DIAGNOSIS — E039 Hypothyroidism, unspecified: Secondary | ICD-10-CM | POA: Diagnosis not present

## 2019-02-19 DIAGNOSIS — I2510 Atherosclerotic heart disease of native coronary artery without angina pectoris: Secondary | ICD-10-CM | POA: Diagnosis not present

## 2019-02-19 DIAGNOSIS — I6523 Occlusion and stenosis of bilateral carotid arteries: Secondary | ICD-10-CM | POA: Diagnosis not present

## 2019-02-19 LAB — GLUCOSE, CAPILLARY
Glucose-Capillary: 159 mg/dL — ABNORMAL HIGH (ref 70–99)
Glucose-Capillary: 323 mg/dL — ABNORMAL HIGH (ref 70–99)

## 2019-02-19 LAB — ECHOCARDIOGRAM COMPLETE
Height: 65 in
Weight: 3590.85 oz

## 2019-02-19 LAB — LIPID PANEL
Cholesterol: 201 mg/dL — ABNORMAL HIGH (ref 0–200)
HDL: 49 mg/dL (ref >40)
LDL Cholesterol: 129 mg/dL — ABNORMAL HIGH (ref 0–99)
Total CHOL/HDL Ratio: 4.1 RATIO
Triglycerides: 117 mg/dL (ref <150)
VLDL: 23 mg/dL (ref 0–40)

## 2019-02-19 LAB — HIV ANTIBODY (ROUTINE TESTING W REFLEX): HIV Screen 4th Generation wRfx: NONREACTIVE

## 2019-02-19 LAB — HEMOGLOBIN A1C
Hgb A1c MFr Bld: 9.2 % — ABNORMAL HIGH (ref 4.8–5.6)
Mean Plasma Glucose: 217.34 mg/dL

## 2019-02-19 LAB — SARS CORONAVIRUS 2 (TAT 6-24 HRS): SARS Coronavirus 2: NEGATIVE

## 2019-02-19 MED ORDER — METFORMIN HCL 1000 MG PO TABS: 500.0000 mg | ORAL_TABLET | Freq: Two times a day (BID) | ORAL | 2 refills | Status: DC

## 2019-02-19 MED ORDER — CLOPIDOGREL BISULFATE 75 MG PO TABS: 75.0000 mg | ORAL_TABLET | Freq: Every day | ORAL | 2 refills | Status: DC

## 2019-02-19 MED ORDER — PRAVASTATIN SODIUM 40 MG PO TABS: 40.0000 mg | ORAL_TABLET | Freq: Every evening | ORAL | 2 refills | Status: DC

## 2019-02-19 MED ORDER — ROPINIROLE HCL 1 MG PO TABS
2.0000 mg | ORAL_TABLET | Freq: Every day | ORAL | Status: AC
  Administered 2019-02-19: 2 mg via ORAL
  Filled 2019-02-19: qty 2

## 2019-02-19 MED ORDER — MECLIZINE HCL 25 MG PO TABS: 25.0000 mg | ORAL_TABLET | Freq: Three times a day (TID) | ORAL | 0 refills | Status: DC | PRN

## 2019-02-19 MED ORDER — FLUTICASONE FUROATE-VILANTEROL 200-25 MCG/INH IN AEPB
INHALATION_SPRAY | RESPIRATORY_TRACT | Status: AC
  Administered 2019-02-19: 09:00:00 1 via RESPIRATORY_TRACT
  Filled 2019-02-19: qty 28

## 2019-02-19 MED ORDER — ASPIRIN EC 81 MG PO TBEC: 81.0000 mg | DELAYED_RELEASE_TABLET | Freq: Every day | ORAL | 2 refills | Status: DC

## 2019-02-19 MED ORDER — LOSARTAN POTASSIUM 50 MG PO TABS: 50.0000 mg | ORAL_TABLET | Freq: Every day | ORAL | 2 refills | Status: DC

## 2019-02-19 MED ORDER — HUMULIN R U-500 KWIKPEN 500 UNIT/ML ~~LOC~~ SOPN: 30.0000 [IU] | PEN_INJECTOR | Freq: Three times a day (TID) | SUBCUTANEOUS | 0 refills | Status: DC

## 2019-02-19 NOTE — Discharge Summary (Signed)
Paul Baker, is a 69 y.o. male  DOB 1950-06-04  MRN [id number].  Admission date:  02/18/2019  Admitting Physician  Bethena Roys, MD  Discharge Date:  02/19/2019   Primary MD  [name], MD  Recommendations for primary care physician for things to follow:    1) your hemoglobin A1c is 9.2----that means that your diabetes is out of control, this increases your risk for strokes--- follow-up to primary care physician for better control/management of your diabetes -Metformin has been increased to 1000 mg twice a day 2) your LDL bad cholesterol is 129, given your history of previous stroke your LDL should be less than 70, your pravastatin has been increased to 40 mg daily from 20 mg for better control of your cholesterol with an attempt to reduce your risk for further strokes 3) your blood pressure is not at goal----persistently high blood pressure will also increase your risk for strokes, losartan has been increased from 25 mg to 50 mg to try to control your blood pressure better 4)Your Testing/investigations today showed no evidence of new strokes--- you do have evidence of previous stroke, at this time your blood pressure is not adequately controlled, your blood sugars are not adequately controlled, and your cholesterol is too high all of these increases your risk for further strokes 5) you may use meclizine as needed for dizziness/vertigo, meclizine will make you sleepy so please do not drive or operate machinery while using meclizine 6) follow-up to primary care physician as advised within a week for recheck and reevaluation  Admission Diagnosis  Dizziness [R42]   Discharge Diagnosis  Dizziness [R42]   Active Problems:   Dizziness      Past Medical History:  Diagnosis Date  . Arthritis   . Asthma   . Colon polyp   . Coronary atherosclerosis of native coronary artery    a. 2011:  cath showing 90% stenosis along small non-dominant RCA (too small for PCI). b. 01/2018: cath showing nonobstructive CAD with 60 to 70% proximal to mid nondominant RCA stenosis, 50% mid LAD and 40 to 50% OM1  . DVT (deep venous thrombosis) ([place]) 2005   Right arm  . Essential hypertension, benign   . Headache   . History of transfusion   . Hypersomnia    CPAP of 16 cm, diagnosed with AHI of 60 in 2012,epworth 21- narcolepsy?  [name] Hypothyroidism   . Mixed hyperlipidemia   . Morbid obesity ([place])   . MRSA (methicillin resistant staph aureus) culture positive    08/2012  . Narcolepsy   . OSA (obstructive sleep apnea)    CPAP  . Osteoarthritis   . Pneumonia   . Psoriasis   . Pulmonary embolism ([place]) 2004  . RLS (restless legs syndrome)   . Rotator cuff disorder    Left  . Septic arthritis of knee, left ([place])   . Skin cancer, basal cell   . Type 2 diabetes mellitus ([place])     Past Surgical History:  Procedure Laterality Date  .  BACK SURGERY    . CATARACT EXTRACTION Bilateral   . COLONOSCOPY    . CYST REMOVAL TRUNK     from back  . EYE SURGERY Left 2016   laser to left eye  . HIP SURGERY     bone removed from both sides of hip  . KNEE ARTHROTOMY Right 12/04/2014   Procedure: KNEE ARTHROTOMY PATELLA LIGAMENT RECONSTRUSION AND REPAIR RIGHT KNEE;  Surgeon: Paralee Cancel, MD;  Location: [facility];  Service: Orthopedics;  Laterality: Right;  . KNEE SURGERY     X 25 TIMES  . LEFT HEART CATH AND CORONARY ANGIOGRAPHY N/A 01/17/2018   Procedure: LEFT HEART CATH AND CORONARY ANGIOGRAPHY;  Surgeon: Belva Crome, MD;  Location: [facility];  Service: Cardiovascular;  Laterality: N/A;  . LUMBAR DISC SURGERY     Left L3, L4, L5 discecotomy with decompression of L4 root  . TONSILLECTOMY    . TOTAL KNEE ARTHROPLASTY  2003   LEFT  . TOTAL KNEE ARTHROPLASTY Right 03/23/2014   Procedure: RIGHT TOTAL KNEE ARTHROPLASTY AND REMOVAL RIGHT TIBIAL  DEEP IMPLANT STAPLE;  Surgeon: Paralee Cancel, MD;   Location: [facility];  Service: Orthopedics;  Laterality: Right;  . TOTAL KNEE REVISION  2005   LEFT  . WRIST SURGERY         HPI  from the history and physical done on the day of admission:   Chief Complaint: Dizziness, left-sided weakness  HPI: Paul Baker is a 69 y.o. male with medical history significant for coronary artery disease, hypertension, diabetes mellitus, restless leg syndrome, DVT. Patient presented to the ED with complaints of dizziness that started 2 days ago- 2/14.  Dizziness started in the morning when he woke up from bed.  Present when lying down and worse when he tries to get up.  Reports headache on bilateral sides of his head-by his temples.  He also reports onset of weakness of his left upper and lower extremity.  Dizziness and weakness but persistent, not worse and not better.  He denies facial asymmetry, no voice change, no change in vision.  No history of strokes.  He takes aspirin 81 mg daily but ran out about a week ago otherwise he has been compliant.  He reports onset of chest pain 3 days ago, that started while he was driving his car.  Chest pain is left-sided, described as stabbing.  Chest pain is unrelated to activity or rest.  Chest pain is sometimes associated with difficulty breathing, no nausea no vomiting.  ED Course: Stable vitals.  Head CT negative for acute abnormality.. Hs troponin unremarkable.  Hospitalist to admit for chest pain and possible stroke.   Hospital Course:     1)Dizziness and left-sided weakness/MRI evidence of old stroke non-hemorrhagic --- symptoms mostly resolved, patient continues to have mild vertiginous symptoms, Dix-Hallpike maneuver is positive--- -carotid artery Dopplers without hemodynamically significant stenosis -MRI brain with small focus of encephalomalacia within the mid left frontal lobe but no evidence of acute intracranial abnormality specifically no acute infarction -MRA head without LVO or proximal high-grade  arterial stenosis, no intracranial aneurysm or other acute finding -May use meclizine as needed for vertigo  -consider Epley maneuvers with occupational therapist as outpatient  -Echo with EF of 55 to 60%, with grade 1 diastolic dysfunction, no regional wall motion normalities -Given history of CAD and prior stroke LDL goal should be 70 -Aspirin as prescribed  2) Atypical chest discomfort in a patient with history of CAD--- chest  pain-free, ruled out for ACS by cardiac enzymes and EKG -s/p LHC in January 2020 without intervention -LDL is 129, increase pravastatin to 40 mg daily  3)DM2--- A1c 9.2, increase Metformin to 1000 twice daily, follow-up with PCP for further adjustments of diabetic medications  4)HTN--BP is not at goal, increase losartan to 50 mg daily  Discharge Condition: stable  Follow UP--- outpatient PT OT and also follow-up with PCP for recheck and reevaluation and adjustment of statin and diabetic regimen   Consults obtained - na  Diet and Activity recommendation:  As advised  Discharge Instructions    Discharge Instructions    Ambulatory referral to Physical Therapy   Complete by: As directed    Call MD for:  difficulty breathing, headache or visual disturbances   Complete by: As directed    Call MD for:  persistant dizziness or light-headedness   Complete by: As directed    Call MD for:  persistant nausea and vomiting   Complete by: As directed    Call MD for:  severe uncontrolled pain   Complete by: As directed    Call MD for:  temperature >100.4   Complete by: As directed    Diet - low sodium heart healthy   Complete by: As directed    Diet Carb Modified   Complete by: As directed    Discharge instructions   Complete by: As directed    1) your hemoglobin A1c is 9.2----that means that your diabetes is out of control, this increases your risk for strokes--- follow-up to primary care physician for better control/management of your diabetes -Metformin has  been increased to 1000 mg twice a day 2) your LDL bad cholesterol is 129, given your history of previous stroke your LDL should be less than 70, your pravastatin has been increased to 40 mg daily from 20 mg for better control of your cholesterol with an attempt to reduce your risk for further strokes 3) your blood pressure is not at goal----persistently high blood pressure will also increase your risk for strokes, losartan has been increased from 25 mg to 50 mg to try to control your blood pressure better 4)Your Testing/investigations today showed no evidence of new strokes--- you do have evidence of previous stroke, at this time your blood pressure is not adequately controlled, your blood sugars are not adequately controlled, and your cholesterol is too high all of these increases your risk for further strokes 5) you may use meclizine as needed for dizziness/vertigo, meclizine will make you sleepy so please do not drive or operate machinery while using meclizine 6) follow-up to primary care physician as advised within a week for recheck and reevaluation   Increase activity slowly   Complete by: As directed         Discharge Medications     Allergies as of 02/19/2019      Reactions   Tape Rash   Pulls off skin   Cardizem [diltiazem Hcl]    Edema    Cardura [doxazosin Mesylate]    Headaches / cramps   Lipitor [atorvastatin] Other (See Comments)   Leg cramps   Paxil [paroxetine Hcl]    Unknown reaction    Codeine Rash, Other (See Comments)   Headache    Gabapentin Rash      Medication List    STOP taking these medications   fenofibrate 160 MG tablet     TAKE these medications   albuterol 108 (90 Base) MCG/ACT inhaler Commonly known as: VENTOLIN HFA  Inhale 2 puffs into the lungs every 6 (six) hours as needed for wheezing or shortness of breath.   alfuzosin 10 MG 24 hr tablet Commonly known as: UROXATRAL Take 10 mg by mouth daily with breakfast.   amLODipine 5 MG  tablet Commonly known as: NORVASC Take 1 tablet (5 mg total) by mouth daily.   aspirin EC 81 MG tablet Take 1 tablet (81 mg total) by mouth daily with breakfast.   B-D ULTRAFINE III SHORT PEN 31G X 8 MM Misc Generic drug: Insulin Pen Needle USING FIVE TIMES DAILY.   Breo Ellipta 200-25 MCG/INH Aepb Generic drug: fluticasone furoate-vilanterol Inhale 1 puff into the lungs daily.   clopidogrel 75 MG tablet Commonly known as: PLAVIX Take 1 tablet (75 mg total) by mouth daily. Start taking on: February 20, 2019   Dexcom G6 Receiver Devi 1 Piece by Does not apply route as needed.   Dexcom G6 Sensor Misc 4 Pieces by Does not apply route once a week.   Euthyrox 137 MCG tablet Generic drug: levothyroxine TAKE 1 TABLET BY MOUTH ONCE DAILY BEFORE BREAKFAST   furosemide 40 MG tablet Commonly known as: LASIX Take 40 mg by mouth. Take 40 mg alternating with 80 mg Daily   glipiZIDE 10 MG 24 hr tablet Commonly known as: GLUCOTROL XL Take 1 tablet (10 mg total) by mouth daily with breakfast.   HumuLIN R U-500 KwikPen 500 UNIT/ML kwikpen Generic drug: insulin regular human CONCENTRATED Inject 30-35 Units into the skin 3 (three) times daily with meals. Per sliding scale. What changed:   how much to take  additional instructions  Another medication with the same name was removed. Continue taking this medication, and follow the directions you see here.   INSULIN SYRINGE 1CC/31GX5/16" 31G X 5/16" 1 ML Misc 1 each by Does not apply route 2 (two) times daily.   isosorbide mononitrate 60 MG 24 hr tablet Commonly known as: IMDUR Take 60 mg ( 1 Tablet)  in the AM and Take 30 mg ( 1/2 Tablet)  in the PM   loratadine 10 MG tablet Commonly known as: CLARITIN Take 10 mg by mouth daily as needed for allergies.   losartan 50 MG tablet Commonly known as: COZAAR Take 1 tablet (50 mg total) by mouth daily. What changed:   medication strength  how much to take   meclizine 25 MG  tablet Commonly known as: ANTIVERT Take 1 tablet (25 mg total) by mouth 3 (three) times daily as needed for dizziness or nausea.   metFORMIN 1000 MG tablet Commonly known as: GLUCOPHAGE Take 0.5 tablets (500 mg total) by mouth 2 (two) times daily with a meal. What changed: medication strength   metoprolol succinate 50 MG 24 hr tablet Commonly known as: TOPROL-XL Take 1 tablet by mouth twice daily   modafinil 200 MG tablet Commonly known as: PROVIGIL Take 1 tablet by mouth once daily   nitroGLYCERIN 0.4 MG SL tablet Commonly known as: NITROSTAT DISSOLVE 1 TABLET UNDER TONGUE EVERY 5 MINUTES UP TO 15 MIN FOR CHESTPAIN. IF NO RELIEF CALL 911.   ONE TOUCH ULTRA TEST test strip Generic drug: glucose blood TEST BLOOD SUGAR UP TO 4 TIMES DAILY.   OneTouch Delica Lancets 99991111 Misc USE AS DIRECTED TO TEST BLOOD SUGAR 4 TIMES DAILY.   pravastatin 40 MG tablet Commonly known as: PRAVACHOL Take 1 tablet (40 mg total) by mouth every evening. What changed:   medication strength  how much to take   rOPINIRole 4  MG tablet Commonly known as: REQUIP Take 0.5 tablets (2 mg total) by mouth 2 (two) times daily.   rOPINIRole 4 MG 24 hr tablet Commonly known as: REQUIP XL Take 1 tablet (4 mg total) by mouth at bedtime.   sodium chloride 0.65 % Soln nasal spray Commonly known as: OCEAN Place 1-2 sprays into both nostrils daily as needed for congestion.   Vitamin D (Ergocalciferol) 1.25 MG (50000 UNIT) Caps capsule Commonly known as: DRISDOL Take 1 capsule by mouth once a week       Major procedures and Radiology Reports - PLEASE review detailed and final reports for all details, in brief -   CT HEAD WO CONTRAST  Result Date: 02/18/2019 CLINICAL DATA:  Dizziness EXAM: CT HEAD WITHOUT CONTRAST TECHNIQUE: Contiguous axial images were obtained from the base of the skull through the vertex without intravenous contrast. COMPARISON:  2014 FINDINGS: Brain: There is no acute intracranial  hemorrhage, mass-effect, or edema. Gray-white differentiation is preserved. There is no extra-axial fluid collection. Ventricles and sulci are within normal limits in size and configuration. Vascular: There is atherosclerotic calcification at the skull base. Skull: Calvarium is unremarkable. Sinuses/Orbits: Mild mucosal thickening. Bilateral lens replacements. Other: None. IMPRESSION: No acute intracranial abnormality. Electronically Signed   By: Macy Mis M.D.   On: 02/18/2019 17:03   MR ANGIO HEAD WO CONTRAST  Result Date: 02/19/2019 CLINICAL DATA:  Dizziness with left-sided weakness for 2 days; dizziness, nonspecific. Additional history provided by technologist: Dizziness since Sunday, patient reports left-sided head pain for at least a month and pain shooting to temple temporal area, unsteady gait. EXAM: MRI HEAD WITHOUT CONTRAST MRA HEAD WITHOUT CONTRAST TECHNIQUE: Multiplanar, multiecho pulse sequences of the brain and surrounding structures were obtained without intravenous contrast. Angiographic images of the head were obtained using MRA technique without contrast. COMPARISON:  Head CT 02/18/2019, brain MRI 04/19/2012 FINDINGS: MRI HEAD FINDINGS Brain: Intermittently motion degraded examination. Most notably, there is mild motion degradation of the axial T2 FLAIR sequence and moderate motion degradation of the axial T1 weighted and coronal T2 weighted sequences. There is no evidence of acute infarct. No evidence of intracranial mass. No midline shift or extra-axial fluid collection. No chronic intracranial blood products. Redemonstrated small focus of encephalomalacia within the mid left frontal lobe (series 9, image 32). No significant white matter disease for age. Cerebral volume is normal for age Vascular: Flow voids maintained within the proximal large arterial vessels. Skull and upper cervical spine: No focal marrow lesion Sinuses/Orbits: Visualized orbits demonstrate no acute abnormality. Mild  scattered paranasal sinus mucosal thickening greatest within bilateral ethmoid air cells and within the superior left maxillary sinus. No significant mastoid effusion. MRA HEAD FINDINGS The intracranial internal carotid arteries are patent without significant stenosis. Mild stenosis within the cavernous left ICA (series 107, image 7) the M1 middle cerebral arteries are patent without significant stenosis. No M2 proximal branch occlusion or high-grade proximal stenosis is identified. The anterior cerebral arteries are patent without high-grade proximal stenosis. No intracranial aneurysm is identified. The intracranial vertebral arteries are patent without significant stenosis, as is the basilar artery. The bilateral posterior cerebral arteries are patent without significant proximal stenosis. Posterior communicating arteries are poorly delineated and may be hypoplastic or absent bilaterally. IMPRESSION: MRI brain: 1. Motion degraded examination as described. 2. No evidence of acute intracranial abnormality, including acute infarction. 3. Redemonstrated small focus of encephalomalacia within the mid left frontal lobe. 4. Mild paranasal sinus mucosal thickening MRA head: 1. No intracranial large  vessel occlusion or proximal high-grade arterial stenosis. 2. Mild focal stenosis within the cavernous left ICA. 3. No intracranial aneurysm is identified. Electronically Signed   By: Kellie Simmering DO   On: 02/19/2019 11:43   MR BRAIN WO CONTRAST  Result Date: 02/19/2019 CLINICAL DATA:  Dizziness with left-sided weakness for 2 days; dizziness, nonspecific. Additional history provided by technologist: Dizziness since Sunday, patient reports left-sided head pain for at least a month and pain shooting to temple temporal area, unsteady gait. EXAM: MRI HEAD WITHOUT CONTRAST MRA HEAD WITHOUT CONTRAST TECHNIQUE: Multiplanar, multiecho pulse sequences of the brain and surrounding structures were obtained without intravenous  contrast. Angiographic images of the head were obtained using MRA technique without contrast. COMPARISON:  Head CT 02/18/2019, brain MRI 04/19/2012 FINDINGS: MRI HEAD FINDINGS Brain: Intermittently motion degraded examination. Most notably, there is mild motion degradation of the axial T2 FLAIR sequence and moderate motion degradation of the axial T1 weighted and coronal T2 weighted sequences. There is no evidence of acute infarct. No evidence of intracranial mass. No midline shift or extra-axial fluid collection. No chronic intracranial blood products. Redemonstrated small focus of encephalomalacia within the mid left frontal lobe (series 9, image 32). No significant white matter disease for age. Cerebral volume is normal for age Vascular: Flow voids maintained within the proximal large arterial vessels. Skull and upper cervical spine: No focal marrow lesion Sinuses/Orbits: Visualized orbits demonstrate no acute abnormality. Mild scattered paranasal sinus mucosal thickening greatest within bilateral ethmoid air cells and within the superior left maxillary sinus. No significant mastoid effusion. MRA HEAD FINDINGS The intracranial internal carotid arteries are patent without significant stenosis. Mild stenosis within the cavernous left ICA (series 107, image 7) the M1 middle cerebral arteries are patent without significant stenosis. No M2 proximal branch occlusion or high-grade proximal stenosis is identified. The anterior cerebral arteries are patent without high-grade proximal stenosis. No intracranial aneurysm is identified. The intracranial vertebral arteries are patent without significant stenosis, as is the basilar artery. The bilateral posterior cerebral arteries are patent without significant proximal stenosis. Posterior communicating arteries are poorly delineated and may be hypoplastic or absent bilaterally. IMPRESSION: MRI brain: 1. Motion degraded examination as described. 2. No evidence of acute  intracranial abnormality, including acute infarction. 3. Redemonstrated small focus of encephalomalacia within the mid left frontal lobe. 4. Mild paranasal sinus mucosal thickening MRA head: 1. No intracranial large vessel occlusion or proximal high-grade arterial stenosis. 2. Mild focal stenosis within the cavernous left ICA. 3. No intracranial aneurysm is identified. Electronically Signed   By: Kellie Simmering DO   On: 02/19/2019 11:43   US Carotid Bilateral  Result Date: 02/19/2019 CLINICAL DATA:  Dizziness, left-sided weakness, hypertension, syncope, hyperlipidemia and diabetes. EXAM: BILATERAL CAROTID DUPLEX ULTRASOUND TECHNIQUE: Pearline Cables scale imaging, color Doppler and duplex ultrasound were performed of bilateral carotid and vertebral arteries in the neck. COMPARISON:  None. FINDINGS: Criteria: Quantification of carotid stenosis is based on velocity parameters that correlate the residual internal carotid diameter with NASCET-based stenosis levels, using the diameter of the distal internal carotid lumen as the denominator for stenosis measurement. The following velocity measurements were obtained: RIGHT ICA:  87/21 cm/sec CCA:  [id number] cm/sec SYSTOLIC ICA/CCA RATIO:  0.9 ECA:  70 cm/sec LEFT ICA:  75/30 cm/sec CCA:  [id number] cm/sec SYSTOLIC ICA/CCA RATIO:  0.9 ECA:  58 cm/sec RIGHT CAROTID ARTERY: Minimal plaque at the level of the right carotid bulb and proximal right ICA. Estimated right ICA stenosis is less than 50%. RIGHT VERTEBRAL  ARTERY: Antegrade flow with normal waveform and velocity. LEFT CAROTID ARTERY: Mild plaque at the level of the carotid bulb and left ICA origin. Estimated left ICA stenosis is less than 50%. LEFT VERTEBRAL ARTERY: Antegrade flow with normal waveform and velocity. IMPRESSION: Mild plaque at the level of both carotid bulbs and proximal internal carotid arteries. No significant carotid stenosis identified with estimated bilateral ICA stenoses of less than 50%. Electronically Signed   By:  Aletta Edouard M.D.   On: 02/19/2019 14:46   DG Chest Portable 1 View  Result Date: 02/18/2019 CLINICAL DATA:  Weakness, dizziness EXAM: PORTABLE CHEST 1 VIEW COMPARISON:  01/14/2018 FINDINGS: The heart size and mediastinal contours are stable. No focal airspace consolidation, pleural effusion, or pneumothorax. The visualized skeletal structures are unremarkable. IMPRESSION: No acute cardiopulmonary findings. Electronically Signed   By: Davina Poke D.O.   On: 02/18/2019 16:45   ECHOCARDIOGRAM COMPLETE  Result Date: 02/19/2019    ECHOCARDIOGRAM REPORT   Patient Name:   HEMI SCHULENBURG Date of Exam: 02/19/2019 Medical Rec #:  [id number]          Height:       65.0 in Accession #:    [id number]         Weight:       224.4 lb Date of Birth:  01/08/1950          BSA:          2.08 m Patient Age:    69 years           BP:           140/77 mmHg Patient Gender: M                  HR:           69 bpm. Exam Location:  [facility] Procedure: 2D Echo Indications:    Stroke 434.91 / I163.9  History:        Patient has prior history of Echocardiogram examinations, most                 recent 02/21/2018. Signs/Symptoms:Chest Pain; Risk Factors:Former                 Smoker, Dyslipidemia, Diabetes and Hypertension.  Sonographer:    Leavy Cella RDCS (AE) Referring Phys: [place]  1. Left ventricular ejection fraction, by estimation, is 55 to 60%. The left ventricle has normal function. The left ventricle has no regional wall motion abnormalities. Left ventricular diastolic parameters are consistent with Grade I diastolic dysfunction (impaired relaxation). Elevated left ventricular end-diastolic pressure.  2. Right ventricular systolic function is normal. The right ventricular size is normal.  3. Left atrial size was severely dilated.  4. The mitral valve is grossly normal. Trivial mitral valve regurgitation.  5. The aortic valve was not well visualized. Aortic valve regurgitation is not  visualized.  6. The inferior vena cava is normal in size with greater than 50% respiratory variability, suggesting right atrial pressure of 3 mmHg. FINDINGS  Left Ventricle: Left ventricular ejection fraction, by estimation, is 55 to 60%. The left ventricle has normal function. The left ventricle has no regional wall motion abnormalities. The left ventricular internal cavity size was normal in size. There is  no left ventricular hypertrophy. Left ventricular diastolic parameters are consistent with Grade I diastolic dysfunction (impaired relaxation). Elevated left ventricular end-diastolic pressure. Right Ventricle: The right ventricular size is normal. No increase in right ventricular  wall thickness. Right ventricular systolic function is normal. Left Atrium: Left atrial size was severely dilated. Right Atrium: Right atrial size was normal in size. Pericardium: There is no evidence of pericardial effusion. Mitral Valve: The mitral valve is grossly normal. Mild mitral annular calcification. Trivial mitral valve regurgitation. Tricuspid Valve: The tricuspid valve is grossly normal. Tricuspid valve regurgitation is not demonstrated. Aortic Valve: The aortic valve was not well visualized. Aortic valve regurgitation is not visualized. Mild aortic valve annular calcification. Pulmonic Valve: The pulmonic valve was not well visualized. Pulmonic valve regurgitation is not visualized. Aorta: The aortic root is normal in size and structure. Venous: The inferior vena cava is normal in size with greater than 50% respiratory variability, suggesting right atrial pressure of 3 mmHg. IAS/Shunts: No atrial level shunt detected by color flow Doppler.  LEFT VENTRICLE PLAX 2D LVIDd:         5.14 cm  Diastology LVIDs:         2.44 cm  LV e' lateral:   8.16 cm/s LV PW:         1.12 cm  LV E/e' lateral: 13.1 LV IVS:        0.98 cm  LV e' medial:    6.31 cm/s LVOT diam:     1.90 cm  LV E/e' medial:  17.0 LV SV Index:   47.60 LVOT Area:      2.84 cm  RIGHT VENTRICLE RV S prime:     12.10 cm/s TAPSE (M-mode): 3.2 cm LEFT ATRIUM             Index       RIGHT ATRIUM           Index LA diam:        3.90 cm 1.88 cm/m  RA Area:     14.80 cm LA Vol (A2C):   82.2 ml 39.57 ml/m RA Volume:   37.90 ml  18.24 ml/m LA Vol (A4C):   89.7 ml 43.18 ml/m LA Biplane Vol: 89.6 ml 43.13 ml/m   AORTA Ao Root diam: 3.00 cm MITRAL VALVE MV Area (PHT): 3.27 cm     SHUNTS MV Decel Time: 232 msec     Systemic Diam: 1.90 cm MV E velocity: 107.00 cm/s MV A velocity: 114.00 cm/s MV E/A ratio:  0.94 Kate Sable MD Electronically signed by Kate Sable MD Signature Date/Time: 02/19/2019/9:49:19 AM    Final    Micro Results   Recent Results (from the past 240 hour(s))  SARS CORONAVIRUS 2 (TAT 6-24 HRS) Nasopharyngeal Nasopharyngeal Swab     Status: None   Collection Time: 02/18/19  7:35 PM   Specimen: Nasopharyngeal Swab  Result Value Ref Range Status   SARS Coronavirus 2 NEGATIVE NEGATIVE Final    Comment: (NOTE) SARS-CoV-2 target nucleic acids are NOT DETECTED. The SARS-CoV-2 RNA is generally detectable in upper and lower respiratory specimens during the acute phase of infection. Negative results do not preclude SARS-CoV-2 infection, do not rule out co-infections with other pathogens, and should not be used as the sole basis for treatment or other patient management decisions. Negative results must be combined with clinical observations, patient history, and epidemiological information. The expected result is Negative. Fact Sheet for Patients: SugarRoll.be Fact Sheet for Healthcare Providers: https://www.woods-mathews.com/ This test is not yet approved or cleared by the [place] FDA and  has been authorized for detection and/or diagnosis of SARS-CoV-2 by FDA under an Emergency Use Authorization (EUA). This EUA will remain  in effect (  meaning this test can be used) for the duration of  the COVID-19 declaration under Section 56 4(b)(1) of the Act, 21 U.S.C. section 360bbb-3(b)(1), unless the authorization is terminated or revoked sooner. Performed at [facility], [place] [address], [place], [place] [address]        Today   Subjective    Kumari Abruzzese today has no New complaints   No fever  Or chills   No Nausea, Vomiting or Diarrhea      Patient has been seen and examined prior to discharge   Objective   Blood pressure (!) 153/75, pulse 60, temperature 97.8 F (36.6 C), temperature source Oral, resp. rate 18, height 5\' 5"  (1.651 m), weight 101.8 kg, SpO2 100 %.   Intake/Output Summary (Last 24 hours) at 02/19/2019 1555 Last data filed at 02/19/2019 1300 Gross per 24 hour  Intake 720 ml  Output 1700 ml  Net -980 ml    Exam Gen:- Awake Alert, no acute distress  HEENT:- [place].AT, No sclera icterus Neck-Supple Neck,No JVD,.  Lungs-  CTAB , good air movement bilaterally  CV- S1, S2 normal, regular Abd-  +ve B.Sounds, Abd Soft, No tenderness,    Extremity/Skin:- No  edema,   good pulses Psych-affect is appropriate, oriented x3 Neuro-subtle left-sided hemiparesis apparently not new, no new focal deficits, no tremors , vertigo with changes in head position   Data Review   CBC w Diff:  Lab Results  Component Value Date   WBC 8.4 02/18/2019   HGB 12.4 (L) 02/18/2019   HGB 11.8 (L) 09/11/2018   HCT 38.6 (L) 02/18/2019   HCT 35.6 (L) 09/11/2018   PLT 219 02/18/2019   PLT 199 09/11/2018   LYMPHOPCT 14 02/18/2019   MONOPCT 9 02/18/2019   EOSPCT 1 02/18/2019   BASOPCT 1 02/18/2019    CMP:  Lab Results  Component Value Date   NA 137 02/18/2019   NA 138 09/11/2018   K 3.9 02/18/2019   CL 102 02/18/2019   CO2 26 02/18/2019   BUN 28 (H) 02/18/2019   BUN 22 09/11/2018   CREATININE 1.06 02/18/2019   CREATININE 1.11 02/06/2019   PROT 7.0 02/18/2019   PROT 6.9 09/11/2018   ALBUMIN 4.1 02/18/2019   ALBUMIN 4.6 09/11/2018   BILITOT 0.7  02/18/2019   BILITOT 0.3 09/11/2018   ALKPHOS 54 02/18/2019   AST 19 02/18/2019   ALT 21 02/18/2019  .   Total Discharge time is about 33 minutes  Roxan Hockey M.D on 02/19/2019 at 3:55 PM  Go to [email or web address] -  for contact info  [facility] - Office  [phone]

## 2019-02-19 NOTE — Evaluation (Signed)
Occupational Therapy Evaluation Patient Details Name: Paul Baker MRN: [id number] DOB: 09/21/1950 Today's Date: 02/19/2019    History of Present Illness Patient presented to the ED with complaints of dizziness that started 2 days ago- 2/14.  Dizziness started in the morning when he woke up from bed.  Present when lying down and worse when he tries to get up.  Reports headache on bilateral sides of his head-by his temples.  He also reports onset of weakness of his left upper and lower extremity.  Dizziness and weakness but persistent, not worse and not better.  He denies facial asymmetry, no voice change, no change in vision.  No history of strokes.  He takes aspirin 81 mg daily but ran out about a week ago otherwise he has been compliant.   Clinical Impression   Pt agreeable to OT evaluation this am. Pt reports slight dizziness upon sitting and standing during evaluation, improves after a short time. Pt demonstrating slight LUE weakness compared to RUE, however able to use the LUE functionally without difficulty. LUE coordination is intact, sensation is slightly dulled per pt report however light touch is intact. Pt completing seated ADLs without difficulty, requiring min guard for standing and transfer due to LOB. No further OT services recommended at this time, defer to PT for recommendation regarding mobility. Recommend supervision 24/7 for safety.     Follow Up Recommendations  No OT follow up;Supervision/Assistance - 24 hour    Equipment Recommendations  None recommended by OT       Precautions / Restrictions Precautions Precautions: Fall Restrictions Weight Bearing Restrictions: No      Mobility Bed Mobility Overal bed mobility: Modified Independent                Transfers Overall transfer level: Needs assistance Equipment used: None Transfers: Sit to/from [employer] Transfers Sit to Stand: Min guard Stand pivot transfers: Min guard       General  transfer comment: One LOB during stand pivot, pt able to self-correct        ADL either performed or assessed with clinical judgement   ADL Overall ADL's : Needs assistance/impaired Eating/Feeding: Modified independent;Sitting   Grooming: Wash/dry hands;Modified independent;Sitting Grooming Details (indicate cue type and reason): slight LOB during standing, pt corrected             Lower Body Dressing: Supervision/safety;Sitting/lateral leans Lower Body Dressing Details (indicate cue type and reason): pt donned socks while seated at EOB Toilet Transfer: Leisure centre manager Details (indicate cue type and reason): simulated with bed to chair transfer           General ADL Comments: Pt with decreased dynamic standing balance     Vision Baseline Vision/History: Wears glasses Wears Glasses: Reading only Patient Visual Report: No change from baseline Vision Assessment?: Yes Eye Alignment: Within Functional Limits Ocular Range of Motion: Within Functional Limits Alignment/Gaze Preference: Within Defined Limits Tracking/Visual Pursuits: Able to track stimulus in all quads without difficulty Saccades: Within functional limits Convergence: Within functional limits Visual Fields: No apparent deficits            Pertinent Vitals/Pain Pain Assessment: No/denies pain     Hand Dominance (ambidextrious)   Extremity/Trunk Assessment Upper Extremity Assessment Upper Extremity Assessment: LUE deficits/detail LUE Deficits / Details: LUE slightly weaker than RUE at 4/5 throughout. Light touch is intact however pt reports dull compared to RUE LUE Sensation: WNL LUE Coordination: WNL   Lower Extremity Assessment Lower Extremity Assessment: Defer  to PT evaluation   Cervical / Trunk Assessment Cervical / Trunk Assessment: Normal   Communication Communication Communication: No difficulties   Cognition Arousal/Alertness: Awake/alert Behavior  During Therapy: WFL for tasks assessed/performed Overall Cognitive Status: Within Functional Limits for tasks assessed                                                Home Living Family/patient expects to be discharged to:: Private residence Living Arrangements: Spouse/significant other Available Help at Discharge: Family;Available PRN/intermittently Type of Home: House Home Access: Stairs to enter [employer] of Steps: 2 Entrance Stairs-Rails: None Home Layout: Two level;Able to live on main level with bedroom/bathroom     Bathroom Shower/Tub: Teacher, early years/pre: Standard     Home Equipment: Environmental consultant - 2 wheels;Cane - single point;Bedside commode;Shower seat          Prior Functioning/Environment Level of Independence: Independent        Comments: Independent with mobility, ADLs, driving. Does not use any DME        OT Problem List: Decreased activity tolerance;Impaired balance (sitting and/or standing)       AM-PAC OT "6 Clicks" Daily Activity     Outcome Measure Help from another person eating meals?: None Help from another person taking care of personal grooming?: None Help from another person toileting, which includes using toliet, bedpan, or urinal?: A Little Help from another person bathing (including washing, rinsing, drying)?: A Little Help from another person to put on and taking off regular upper body clothing?: None Help from another person to put on and taking off regular lower body clothing?: A Little 6 Click Score: 21   End of Session Equipment Utilized During Treatment: Gait belt  Activity Tolerance: Patient tolerated treatment well Patient left: in chair;with call bell/phone within reach  OT Visit Diagnosis: Muscle weakness (generalized) (M62.81);Other abnormalities of gait and mobility (R26.89)                Time: [id number] OT Time Calculation (min): 22 min Charges:  OT General Charges $OT Visit: 1  Visit OT Evaluation $OT Eval Low Complexity: [place], OTR/L  [phone] 02/19/2019, 8:13 AM

## 2019-02-19 NOTE — Discharge Instructions (Signed)
1) your hemoglobin A1c is 9.2----that means that your diabetes is out of control, this increases your risk for strokes--- follow-up to primary care physician for better control/management of your diabetes -Metformin has been increased to 1000 mg twice a day 2) your LDL bad cholesterol is 129, given your history of previous stroke your LDL should be less than 70, your pravastatin has been increased to 40 mg daily from 20 mg for better control of your cholesterol with an attempt to reduce your risk for further strokes 3) your blood pressure is not at goal----persistently high blood pressure will also increase your risk for strokes, losartan has been increased from 25 mg to 50 mg to try to control your blood pressure better 4)Your Testing/investigations today showed no evidence of new strokes--- you do have evidence of previous stroke, at this time your blood pressure is not adequately controlled, your blood sugars are not adequately controlled, and your cholesterol is too high all of these increases your risk for further strokes 5) you may use meclizine as needed for dizziness/vertigo, meclizine will make you sleepy so please do not drive or operate machinery while using meclizine 6) follow-up to primary care physician as advised within a week for recheck and reevaluation

## 2019-02-19 NOTE — Progress Notes (Signed)
IV and tele removed. D/C instructions reviewed with patient, verbalized understanding. Patient transported to private vehicle via wheelchair.

## 2019-02-19 NOTE — Plan of Care (Signed)
  Problem: Acute Rehab PT Goals(only PT should resolve) Goal: Pt Will Go Supine/Side To Sit Outcome: Progressing Flowsheets (Taken 02/19/2019 0851) Pt will go Supine/Side to Sit: Independently Goal: Patient Will Transfer Sit To/From Stand Outcome: Progressing Flowsheets (Taken 02/19/2019 0851) Patient will transfer sit to/from stand: Independently Goal: Pt Will Transfer Bed To Chair/Chair To Bed Outcome: Progressing Flowsheets (Taken 02/19/2019 0851) Pt will Transfer Bed to Chair/Chair to Bed: Independently Goal: Pt Will Ambulate Outcome: Progressing Flowsheets (Taken 02/19/2019 0851) Pt will Ambulate:  100 feet  with modified independence   8:51 AM, 02/19/19 Lonell Grandchild, MPT Physical Therapist with [facility] [phone] office [phone] mobile phone

## 2019-02-19 NOTE — Progress Notes (Signed)
Inpatient Diabetes Program Recommendations  AACE/ADA: New Consensus Statement on Inpatient Glycemic Control   Target Ranges:  Prepandial:   less than 140 mg/dL      Peak postprandial:   less than 180 mg/dL (1-2 hours)      Critically ill patients:  140 - 180 mg/dL   Results for Paul Baker, Paul Baker (MRN [id number]) as of 02/19/2019 12:48  Ref. Range 02/18/2019 22:26 02/19/2019 07:39 02/19/2019 11:31  Glucose-Capillary Latest Ref Range: 70 - 99 mg/dL 146 (H) 159 (H) 323 (H)  Results for Paul Baker, Paul Baker (MRN [id number]) as of 02/19/2019 12:48  Ref. Range 01/14/2019 11:44 02/18/2019 19:20  Hemoglobin A1C Latest Ref Range: 4.8 - 5.6 % 9.0 (A) 9.2 (H)   Review of Glycemic Control  Diabetes history: DM2 Outpatient Diabetes medications: Regular 30-36 units TID with meals, Glipizide XL 10 mg daily, Metformin 500 mg BID, Humulin R U500 listed on home med list but patient has not started due to cost  Current orders for Inpatient glycemic control: Humuln R U500 30 units TID with meals, Novolog 0-15 units TID with meals, Novolog 0-5 units QHS, Glipizide XL 10 mg QAM  Inpatient Diabetes Program Recommendations:   NOTE: In reviewing chart, noted patient sees Dr. [name] and was last seen 02/12/19. Per office visit note on 02/12/19 by Dr. [name], "He has used his supplies of Tresiba and regular insulin I discussed and switched him to insulin U500 50 units 3 times daily AC for Premeal blood glucose greater than 90 mg per DL. He will continue to use his CGM device, Dexcom.He is advised to continue Metformin 500 mg p.o. twice daily.  He is advised to continue glipizide 10 mg XL p.o. daily with breakfast."  Per home medication list currently in chart, it is noted that patient cannot afford the Humulin R U500 and was planning to get it filled on 02/19/19 when he got paid.    Spoke with patient over the phone about diabetes and home regimen for diabetes control. Patient reports being followed by Dr. [name] for diabetes  management and currently taking Regular 30-36 units TID, Glipizide 10 mg daily, and Metformin 500 mg BID for DM control. Patient was also taking [place] daily until he ran out of it a few days ago. Patient states that he has not started the Humulin R U500 yet because the pharmacy did not have it and had to order it. Patient plans to go pick up the Humulin R U500 today when he is discharged.  Patient uses a Dexcom sensor for continuous glucose monitoring. Patient reports that he has hypoglycemia at times and notes his glucose was down in the 30's last week on Thursday during the night (had taken [place] before bed and took Regular insulin with supper).  Discussed hypoglycemia and treatment. Encouraged patient to be sure to reach out to Dr. [name] if he has any hypoglycemia especially since he is planning to start the Humulin R U500 tomorrow morning.  Patient has taken Humulin R U500 in the past and experienced frequent  hypoglycemia with the insulin. Reviewed how Humulin R U500 works and asked patient to document in his Dexcom app when he takes insulin and eats meals so Dr. [name] can use the information to make adjustments with his DM medications if needed.   Patient verbalized understanding of information discussed and reports no further questions at this time related to diabetes.   Thanks, Barnie Alderman, RN, MSN, CDE Diabetes Coordinator Inpatient Diabetes Program [phone] (Team Pager from [place]  to 5pm)

## 2019-02-19 NOTE — Evaluation (Signed)
Physical Therapy Evaluation Patient Details Name: Paul Baker MRN: [id number] DOB: January 10, 1950 Today's Date: 02/19/2019   History of Present Illness  Patient presented to the ED with complaints of dizziness that started 2 days ago- 2/14.  Dizziness started in the morning when he woke up from bed.  Present when lying down and worse when he tries to get up.  Reports headache on bilateral sides of his head-by his temples.  He also reports onset of weakness of his left upper and lower extremity.  Dizziness and weakness but persistent, not worse and not better.  He denies facial asymmetry, no voice change, no change in vision.  No history of strokes.  He takes aspirin 81 mg daily but ran out about a week ago otherwise he has been compliant.    Clinical Impression  Patient functioning near baseline for functional mobility and gait, LLE slightly weaker than right, c/o deceased proprioception of left foot during ambulation without loss of balance, but slower than normal cadence, demonstrates good return for transferring to commode in bathroom and tolerated sitting up in chair after therapy.  Patient will benefit from continued physical therapy in hospital and recommended venue below to increase strength, balance, endurance for safe ADLs and gait.     Follow Up Recommendations Outpatient PT;Supervision for mobility/OOB;Supervision - Intermittent    Equipment Recommendations  None recommended by PT    Recommendations for Other Services       Precautions / Restrictions Precautions Precautions: Fall Restrictions Weight Bearing Restrictions: No      Mobility  Bed Mobility Overal bed mobility: Modified Independent             General bed mobility comments: slightly increased time  Transfers Overall transfer level: Modified independent Equipment used: None Transfers: Sit to/from [employer] Sit to Stand: Min guard Stand pivot transfers: Min guard       General  transfer comment: increased time  Ambulation/Gait Ambulation/Gait assistance: Supervision Gait Distance (Feet): 85 Feet Assistive device: None Gait Pattern/deviations: Decreased step length - left;Decreased stride length Gait velocity: decreased   General Gait Details: slightly labored with decreased LLE stance time with c/o mildly decreased proprioception of left foot when taking steps, no loss of balance, limited mostly due to c/o fatigue  Stairs            Wheelchair Mobility    Modified Rankin (Stroke Patients Only)       Balance Overall balance assessment: Mild deficits observed, not formally tested                                           Pertinent Vitals/Pain Pain Assessment: 0-10 Pain Score: 2  Pain Location: headache bilateral temples Pain Descriptors / Indicators: Aching Pain Intervention(s): Limited activity within patient's tolerance;Monitored during session    Home Living Family/patient expects to be discharged to:: Private residence Living Arrangements: Spouse/significant other Available Help at Discharge: Family;Available PRN/intermittently Type of Home: House Home Access: Stairs to enter Entrance Stairs-Rails: Right;Left;Can reach both Entrance Stairs-Number of Steps: 2 Home Layout: Two level;Able to live on main level with bedroom/bathroom Home Equipment: Gilford Rile - 2 wheels;Cane - single point;Bedside commode;Shower seat      Prior Function Level of Independence: Independent         Comments: Hydrographic surveyor, drives     Hand Dominance   Dominant Hand: (ambidextrious)    Extremity/Trunk  Assessment   Upper Extremity Assessment Upper Extremity Assessment: Defer to OT evaluation LUE Deficits / Details: LUE slightly weaker than RUE at 4/5 throughout. Light touch is intact however pt reports dull compared to RUE LUE Sensation: WNL LUE Coordination: WNL    Lower Extremity Assessment Lower Extremity Assessment:  Generalized weakness;RLE deficits/detail;LLE deficits/detail RLE Deficits / Details: grossly 5/5 RLE Sensation: WNL RLE Coordination: WNL LLE Deficits / Details: grossly -4/5 except ankle dorsilfexion grossly 3+/5 LLE Sensation: decreased proprioception LLE Coordination: WNL    Cervical / Trunk Assessment Cervical / Trunk Assessment: Normal  Communication   Communication: No difficulties  Cognition Arousal/Alertness: Awake/alert Behavior During Therapy: WFL for tasks assessed/performed Overall Cognitive Status: Within Functional Limits for tasks assessed                                        General Comments      Exercises     Assessment/Plan    PT Assessment Patient needs continued PT services  PT Problem List Decreased strength;Decreased activity tolerance;Decreased balance;Decreased mobility       PT Treatment Interventions Balance training;Gait training;Stair training;Functional mobility training;Therapeutic activities;Therapeutic exercise;Patient/family education    PT Goals (Current goals can be found in the Care Plan section)  Acute Rehab PT Goals Patient Stated Goal: return home with family to assist PT Goal Formulation: With patient Time For Goal Achievement: 02/21/19 Potential to Achieve Goals: Good    Frequency Min 2X/week   Barriers to discharge        Co-evaluation               AM-PAC PT "6 Clicks" Mobility  Outcome Measure Help needed turning from your back to your side while in a flat bed without using bedrails?: None Help needed moving from lying on your back to sitting on the side of a flat bed without using bedrails?: None Help needed moving to and from a bed to a chair (including a wheelchair)?: None Help needed standing up from a chair using your arms (e.g., wheelchair or bedside chair)?: None Help needed to walk in hospital room?: A Little Help needed climbing 3-5 steps with a railing? : A Little 6 Click Score:  22    End of Session   Activity Tolerance: Patient tolerated treatment well;Patient limited by fatigue Patient left: in chair;with call bell/phone within reach Nurse Communication: Mobility status PT Visit Diagnosis: Unsteadiness on feet (R26.81);Other abnormalities of gait and mobility (R26.89);Muscle weakness (generalized) (M62.81)    Time: [id number] PT Time Calculation (min) (ACUTE ONLY): 20 min   Charges:   PT Evaluation $PT Eval Moderate Complexity: 1 Mod PT Treatments $Therapeutic Activity: 8-22 mins        8:50 AM, 02/19/19 Lonell Grandchild, MPT Physical Therapist with [facility] [phone] office [phone] mobile phone

## 2019-02-19 NOTE — Clinical Social Work Note (Signed)
Discussed OPPT recommendation. Patient agreeable to [facility] OPPT rehab in [place]. OPPT order placed.    Amiya Escamilla, Clydene Pugh, LCSW

## 2019-02-19 NOTE — Progress Notes (Signed)
*  PRELIMINARY RESULTS* Echocardiogram 2D Echocardiogram has been performed.  Leavy Cella 02/19/2019, 9:03 AM

## 2019-02-21 ENCOUNTER — Telehealth: Payer: Self-pay | Admitting: "Endocrinology

## 2019-02-21 NOTE — Telephone Encounter (Signed)
Patient left a VM that he is having readings over 400. Please advise

## 2019-02-24 NOTE — Telephone Encounter (Signed)
Left a message requesting a return call to the office. 

## 2019-02-25 ENCOUNTER — Ambulatory Visit (INDEPENDENT_AMBULATORY_CARE_PROVIDER_SITE_OTHER): Payer: Medicare HMO | Admitting: Family Medicine

## 2019-02-25 ENCOUNTER — Other Ambulatory Visit: Payer: Self-pay

## 2019-02-25 VITALS — BP 136/80 | Temp 97.8°F | Wt 232.8 lb

## 2019-02-25 DIAGNOSIS — E782 Mixed hyperlipidemia: Secondary | ICD-10-CM | POA: Diagnosis not present

## 2019-02-25 DIAGNOSIS — R42 Dizziness and giddiness: Secondary | ICD-10-CM

## 2019-02-25 DIAGNOSIS — I10 Essential (primary) hypertension: Secondary | ICD-10-CM

## 2019-02-25 MED ORDER — PRAVASTATIN SODIUM 40 MG PO TABS: 40.0000 mg | ORAL_TABLET | Freq: Every evening | ORAL | 5 refills | Status: DC

## 2019-02-25 MED ORDER — CLOPIDOGREL BISULFATE 75 MG PO TABS: 75.0000 mg | ORAL_TABLET | Freq: Every day | ORAL | 5 refills | Status: DC

## 2019-02-25 NOTE — Telephone Encounter (Signed)
Left a message requesting a return call to the office. 

## 2019-02-25 NOTE — Progress Notes (Signed)
   Subjective:    Patient ID: Paul Baker, male    DOB: 1950-12-12, 69 y.o.   MRN: [id number]  HPI Patient arrives for a follow up on recent hospitalization for dizziness.  Complete hospital record reviewed in presence of patient.  Patient presented with dizziness.  Left-sided weakness.  And an element of chest pain.  Patient had negative cardiac enzymes.  Chest discomfort was nonspecific.  Patient had scan of the brain and assessment of carotid arteries could indicate CT and MRI which revealed evidence of potential old stroke in the frontal lobe of the brain  On further history dizziness struck when he changed position.  Sometimes turning his head from side to side.  Will have a transient unsteady vertigo-like dizziness it would last a few moments then fade.  Patient notes some ongoing weakness or clumsiness in his left hand along with diminished sensation left lateral face and hand.  On further history notes he may have been experiencing these for several months Review of Systems No current chest pain no headache no shortness of breath    Objective:   Physical Exam Alert and oriented, vitals reviewed and stable, NAD ENT-TM's and ext canals WNL bilat via otoscopic exam Soft palate, tonsils and post pharynx WNL via oropharyngeal exam Neck-symmetric, no masses; thyroid nonpalpable and nontender Pulmonary-no tachypnea or accessory muscle use; Clear without wheezes via auscultation Card--no abnrml murmurs, rhythm reg and rate WNL Carotid pulses symmetric, without bruits Left hand grip feels somewhat diminished.  Fine motor control left hand somewhat compromised on thumb to fingertip testing.  Negative cerebellar findings.  Left lateral face dorsum of hand diffusely slightly diminished sensation.       Assessment & Plan:  Impression left-sided clumsiness left-sided numbness.  Accompanied by dizziness which sounds more inner ear in nature.  Encephalomalacia on MRI was also  present in 2014.  Discussed.  Patient may have suffered a mild stroke.  The hospitalist physicians recommended adding Plavix to his aspirin.  In addition he stopped taking Lopid and started with a statin.  Patient's dizziness overall better.  Recommend neurology referral.  Question is whether he truly had a stroke.  If he did it is a mild 1.  The additional question is should he maintain Plavix plus aspirin.  We will repeat blood work in 3 months to assess response to statin.  Warning signs discussed.  If dizziness continues we will also may be need an ENT referral.  Greater than 50% of this 35 minute face to face visit was spent in counseling and discussion and coordination of care regarding the above diagnosis/diagnosies

## 2019-02-26 ENCOUNTER — Telehealth: Payer: Self-pay | Admitting: *Deleted

## 2019-02-26 NOTE — Telephone Encounter (Signed)
Received email from pt that he needed appt for being seen in the [facility] ED for dizzness/ stroke. Needs appt. Do you want to see or JM/NP? Asking for 3/21 appt.

## 2019-02-26 NOTE — Telephone Encounter (Signed)
According to his discharge note from the ED they advised that he should follow-up with his PCP.  He did not have a new stroke per the notes but an old note.  We have only been following him for obstructive sleep apnea and restless leg syndrome to my knowledge.  My advice would be to follow-up with PCP first.

## 2019-02-26 NOTE — Telephone Encounter (Signed)
Called pt he stated his BG readings had improved.

## 2019-03-02 ENCOUNTER — Encounter: Payer: Self-pay | Admitting: Family Medicine

## 2019-03-02 NOTE — Telephone Encounter (Signed)
Since this is a new issue he should be scheduled with Dr. Brett Fairy

## 2019-03-05 ENCOUNTER — Other Ambulatory Visit: Payer: Self-pay | Admitting: *Deleted

## 2019-03-05 MED ORDER — LOSARTAN POTASSIUM 50 MG PO TABS: 50.0000 mg | ORAL_TABLET | Freq: Every day | ORAL | 0 refills | Status: DC

## 2019-03-06 ENCOUNTER — Other Ambulatory Visit: Payer: Self-pay

## 2019-03-06 ENCOUNTER — Ambulatory Visit: Payer: Medicare HMO | Attending: Internal Medicine

## 2019-03-06 DIAGNOSIS — Z23 Encounter for immunization: Secondary | ICD-10-CM | POA: Insufficient documentation

## 2019-03-06 NOTE — Progress Notes (Signed)
   Covid-19 Vaccination Clinic  Name:  Paul Baker    MRN: [id number] DOB: 01-20-1950  03/06/2019  [name] was observed post Covid-19 immunization for 15 minutes without incident. He was provided with Vaccine Information Sheet and instruction to access the V-Safe system.   [name] was instructed to call 911 with any severe reactions post vaccine: [name] Difficulty breathing  . Swelling of face and throat  . A fast heartbeat  . A bad rash all over body  . Dizziness and weakness

## 2019-03-26 ENCOUNTER — Other Ambulatory Visit: Payer: Self-pay | Admitting: "Endocrinology

## 2019-03-27 ENCOUNTER — Ambulatory Visit: Payer: Medicare HMO | Attending: Internal Medicine

## 2019-03-27 DIAGNOSIS — Z23 Encounter for immunization: Secondary | ICD-10-CM

## 2019-03-27 NOTE — Progress Notes (Signed)
   Covid-19 Vaccination Clinic  Name:  MYKE MAMMONE    MRN: [id number] DOB: 1950-11-21  03/27/2019  Mr. Godsil was observed post Covid-19 immunization for 15 minutes without incident. He was provided with Vaccine Information Sheet and instruction to access the V-Safe system.   Mr. Mucha was instructed to call 911 with any severe reactions post vaccine: [name] Difficulty breathing  . Swelling of face and throat  . A fast heartbeat  . A bad rash all over body  . Dizziness and weakness   Immunizations Administered    Name Date Dose VIS Date Route   Pfizer COVID-19 Vaccine 03/27/2019  9:02 AM 0.3 mL 12/13/2018 Intramuscular   Manufacturer: [employer]   Lot: [id number]   [place]: [id number]

## 2019-03-30 ENCOUNTER — Other Ambulatory Visit: Payer: Self-pay | Admitting: Neurology

## 2019-04-01 DIAGNOSIS — N182 Chronic kidney disease, stage 2 (mild): Secondary | ICD-10-CM | POA: Diagnosis not present

## 2019-04-01 DIAGNOSIS — Z6836 Body mass index (BMI) 36.0-36.9, adult: Secondary | ICD-10-CM | POA: Diagnosis not present

## 2019-04-01 DIAGNOSIS — I129 Hypertensive chronic kidney disease with stage 1 through stage 4 chronic kidney disease, or unspecified chronic kidney disease: Secondary | ICD-10-CM | POA: Diagnosis not present

## 2019-04-01 DIAGNOSIS — R42 Dizziness and giddiness: Secondary | ICD-10-CM | POA: Diagnosis not present

## 2019-04-01 DIAGNOSIS — E1122 Type 2 diabetes mellitus with diabetic chronic kidney disease: Secondary | ICD-10-CM | POA: Diagnosis not present

## 2019-04-01 DIAGNOSIS — E1129 Type 2 diabetes mellitus with other diabetic kidney complication: Secondary | ICD-10-CM | POA: Diagnosis not present

## 2019-04-08 ENCOUNTER — Other Ambulatory Visit: Payer: Self-pay | Admitting: Neurology

## 2019-04-11 DIAGNOSIS — N401 Enlarged prostate with lower urinary tract symptoms: Secondary | ICD-10-CM | POA: Diagnosis not present

## 2019-04-17 ENCOUNTER — Ambulatory Visit: Payer: Medicare HMO | Admitting: "Endocrinology

## 2019-04-21 ENCOUNTER — Ambulatory Visit: Payer: Medicare HMO | Admitting: Pulmonary Disease

## 2019-04-21 ENCOUNTER — Encounter: Payer: Self-pay | Admitting: Pulmonary Disease

## 2019-04-21 ENCOUNTER — Other Ambulatory Visit: Payer: Self-pay | Admitting: "Endocrinology

## 2019-04-21 ENCOUNTER — Other Ambulatory Visit: Payer: Self-pay

## 2019-04-21 VITALS — BP 134/72 | HR 67 | Ht 65.0 in | Wt 221.8 lb

## 2019-04-21 DIAGNOSIS — J4550 Severe persistent asthma, uncomplicated: Secondary | ICD-10-CM | POA: Diagnosis not present

## 2019-04-21 NOTE — Progress Notes (Signed)
[facility]  Chief Complaint  Patient presents with  . Follow-up    Pt states he has been okay since last visit. Pt states he will still occ have SOB that can happen at any time but is mainly with activities.    Constitutional:  BP 134/72 (BP Location: Left Arm, Patient Position: Sitting, Cuff Size: Large)   Pulse 67   Ht 5\' 5"  (1.651 m)   Wt 221 lb 12.8 oz (100.6 kg)   SpO2 96% Comment: room air  BMI 36.91 kg/m   Past Medical History:  Rt subclavian vein stenosis, Fatty liver, Spinal stenosis, OSA, Non obstructive CAD, Colon polyps, DVT Rt arm, HA, HTN, Hypothyroidism, HLD, PNA, PE, Psoriasis, RLS, Septic arthritis Lt knee, DM  Brief Summary:  Paul Baker is a 69 y.o. male former smoker with asthma.  Subjective:  He had CT chest in December.  No ILD (reviewed by me with him).  Breathing better.  Has intermittent cough and chest congestion, but not as bad as before.  Still gets winded with activity, but trying to stay more active.  Still has trouble going up stairs, but this is also related to knee pains.  Received COVID 19 vaccinations.  Respiratory Exam:   ENMT - no sinus tenderness, no nasal discharge, no oral exudate  Respiratory - normal appearance of chest wall, normal respiratory effort w/o accessory muscle use, no dullness on percussion, no wheezing or rales  CV - s1s2 regular rate and rhythm, no murmurs, no peripheral edema, radial pulses symmetric  Ext - no cyanosis, clubbing, or joint inflammation noted     Assessment/Plan:   Severe, persistent asthma. - improved since adding LABA to ICS - continue breo 200/25 for now; if he continues to do well, then could consider changing to breo 100/25 at next visit - prn albuterol  Restriction and diffusion defect on recent PFT. - related to obesity - encouraged him to maintain a diet/exercise/weight loss regimen  Non obstructive CAD. - followed by Dr. Domenic Polite with  cardiology  Obstructive sleep apnea, RLS. - followed at [facility]  A total of  21 minutes spent addressing patient care issues on day of visit.   Follow up:   Patient Instructions  Follow up in 6 months in [place] office   Signature:  Chesley Mires, MD [place] Pager: [phone] 04/21/2019, 11:38 AM  Flow Sheet     Pulmonary tests:  Spirometry 10/17/11 >> 2.58 (89%), FEV1% 85 PFT 09/11/18 >> FEV1 2.19 (79%), FEV1% 82, TLC 4.48 (74%), DLCO 70%, +BD from change in FEF 25-75%  Chest imaging:  HRCT chest 12/19/18 >> atherosclerosis, patchy air trapping, fatty liver  Sleep tests:  PSG 2012 >> AHI 60  Cardiac tests:  LHC 01/17/18 >> non obstructive CAD Echo 02/21/18 >> EF 55 to 60%  Medications:   Allergies as of 04/21/2019      Reactions   Tape Rash   Pulls off skin   Cardizem [diltiazem Hcl]    Edema    Cardura [doxazosin Mesylate]    Headaches / cramps   Lipitor [atorvastatin] Other (See Comments)   Leg cramps   Paxil [paroxetine Hcl]    Unknown reaction    Codeine Rash, Other (See Comments)   Headache    Gabapentin Rash      Medication List       Accurate as of April 21, 2019 11:38 AM. If you have any questions, ask your nurse or doctor.  STOP taking these medications   aspirin EC 81 MG tablet Stopped by: Chesley Mires, MD     TAKE these medications   albuterol 108 (90 Base) MCG/ACT inhaler Commonly known as: VENTOLIN HFA Inhale 2 puffs into the lungs every 6 (six) hours as needed for wheezing or shortness of breath.   alfuzosin 10 MG 24 hr tablet Commonly known as: UROXATRAL Take 10 mg by mouth daily with breakfast.   amLODipine 5 MG tablet Commonly known as: NORVASC Take 1 tablet (5 mg total) by mouth daily.   B-D ULTRAFINE III SHORT PEN 31G X 8 MM Misc Generic drug: Insulin Pen Needle USING FIVE TIMES DAILY.   Breo Ellipta 200-25 MCG/INH Aepb Generic drug: fluticasone furoate-vilanterol Inhale 1  puff into the lungs daily.   clopidogrel 75 MG tablet Commonly known as: PLAVIX Take 1 tablet (75 mg total) by mouth daily.   Dexcom G6 Receiver Devi 1 Piece by Does not apply route as needed.   Dexcom G6 Sensor Misc 4 Pieces by Does not apply route once a week.   Euthyrox 137 MCG tablet Generic drug: levothyroxine TAKE 1 TABLET BY MOUTH ONCE DAILY BEFORE BREAKFAST   furosemide 40 MG tablet Commonly known as: LASIX Take 40 mg by mouth. Take 40 mg alternating with 80 mg Daily   glipiZIDE 10 MG 24 hr tablet Commonly known as: GLUCOTROL XL Take 1 tablet (10 mg total) by mouth daily with breakfast.   HumuLIN R U-500 KwikPen 500 UNIT/ML kwikpen Generic drug: insulin regular human CONCENTRATED Inject 30-35 Units into the skin 3 (three) times daily with meals. Per sliding scale.   INSULIN SYRINGE 1CC/31GX5/16" 31G X 5/16" 1 ML Misc 1 each by Does not apply route 2 (two) times daily.   isosorbide mononitrate 60 MG 24 hr tablet Commonly known as: IMDUR Take 60 mg ( 1 Tablet)  in the AM and Take 30 mg ( 1/2 Tablet)  in the PM   loratadine 10 MG tablet Commonly known as: CLARITIN Take 10 mg by mouth daily as needed for allergies.   losartan 50 MG tablet Commonly known as: COZAAR Take 1 tablet (50 mg total) by mouth daily.   meclizine 25 MG tablet Commonly known as: ANTIVERT Take 1 tablet (25 mg total) by mouth 3 (three) times daily as needed for dizziness or nausea.   metFORMIN 1000 MG tablet Commonly known as: GLUCOPHAGE Take 0.5 tablets (500 mg total) by mouth 2 (two) times daily with a meal.   metoprolol succinate 50 MG 24 hr tablet Commonly known as: TOPROL-XL Take 1 tablet by mouth twice daily   modafinil 200 MG tablet Commonly known as: PROVIGIL TAKE ONE TABLET BY MOUTH ONE TIME DAILY   nitroGLYCERIN 0.4 MG SL tablet Commonly known as: NITROSTAT DISSOLVE 1 TABLET UNDER TONGUE EVERY 5 MINUTES UP TO 15 MIN FOR CHESTPAIN. IF NO RELIEF CALL 911.   ONE TOUCH ULTRA  TEST test strip Generic drug: glucose blood TEST BLOOD SUGAR UP TO 4 TIMES DAILY.   OneTouch Delica Lancets 99991111 Misc USE AS DIRECTED TO TEST BLOOD SUGAR 4 TIMES DAILY.   pravastatin 40 MG tablet Commonly known as: PRAVACHOL Take 1 tablet (40 mg total) by mouth every evening.   rOPINIRole 4 MG tablet Commonly known as: REQUIP Take 0.5 tablets (2 mg total) by mouth 2 (two) times daily.   rOPINIRole 4 MG 24 hr tablet Commonly known as: REQUIP XL Take 1 tablet (4 mg total) by mouth at bedtime.   sodium  chloride 0.65 % Soln nasal spray Commonly known as: OCEAN Place 1-2 sprays into both nostrils daily as needed for congestion.   Vitamin D (Ergocalciferol) 1.25 MG (50000 UNIT) Caps capsule Commonly known as: DRISDOL Take 1 capsule by mouth once a week       Past Surgical History:  He  has a past surgical history that includes Total knee arthroplasty (2003); Lumbar disc surgery; Wrist surgery; Total knee revision (2005); Colonoscopy; Back surgery; Knee surgery; Total knee arthroplasty (Right, 03/23/2014); Tonsillectomy; Hip surgery; Eye surgery (Left, 2016); Cataract extraction (Bilateral); Knee arthrotomy (Right, 12/04/2014); Cyst removal trunk; and LEFT HEART CATH AND CORONARY ANGIOGRAPHY (N/A, 01/17/2018).  Family History:  His family history includes Diabetes in his sister; Heart attack in his father; Hypertension in his father; Kidney Stones in his father; Narcolepsy in his grandchild; Seizures in his grandchild.  Social History:  He  reports that he quit smoking about 24 years ago. His smoking use included cigarettes. He started smoking about 51 years ago. He has a 15.00 pack-year smoking history. He has never used smokeless tobacco. He reports that he does not drink alcohol or use drugs.

## 2019-04-21 NOTE — Patient Instructions (Signed)
Follow up in 6 months in [place] office

## 2019-04-23 DIAGNOSIS — E1165 Type 2 diabetes mellitus with hyperglycemia: Secondary | ICD-10-CM | POA: Diagnosis not present

## 2019-04-25 ENCOUNTER — Telehealth: Payer: Self-pay | Admitting: Family Medicine

## 2019-04-25 NOTE — Telephone Encounter (Signed)
Asked all the nurses and noone remembers calling him. He is not in any of the nurse baskets and nothing in his chart showing he had any messages.

## 2019-04-25 NOTE — Telephone Encounter (Signed)
Pt states he missed a call from office on Wednesday around 4 and had a voicemail to return call.

## 2019-04-27 ENCOUNTER — Encounter: Payer: Self-pay | Admitting: Emergency Medicine

## 2019-04-27 ENCOUNTER — Other Ambulatory Visit: Payer: Self-pay

## 2019-04-27 ENCOUNTER — Encounter (HOSPITAL_COMMUNITY): Payer: Self-pay | Admitting: Emergency Medicine

## 2019-04-27 ENCOUNTER — Ambulatory Visit (INDEPENDENT_AMBULATORY_CARE_PROVIDER_SITE_OTHER)
Admission: EM | Admit: 2019-04-27 | Discharge: 2019-04-27 | Disposition: A | Discharge status: Other Acute Inpatient Hospital | Payer: Medicare HMO | Source: Home / Self Care

## 2019-04-27 ENCOUNTER — Emergency Department (HOSPITAL_COMMUNITY)
Admission: EM | Admit: 2019-04-27 | Discharge: 2019-04-27 | Disposition: A | Discharge status: Home / Self Care | Payer: Medicare HMO | Attending: Emergency Medicine | Admitting: Emergency Medicine

## 2019-04-27 DIAGNOSIS — R030 Elevated blood-pressure reading, without diagnosis of hypertension: Secondary | ICD-10-CM | POA: Insufficient documentation

## 2019-04-27 DIAGNOSIS — R519 Headache, unspecified: Secondary | ICD-10-CM | POA: Diagnosis not present

## 2019-04-27 DIAGNOSIS — R739 Hyperglycemia, unspecified: Secondary | ICD-10-CM

## 2019-04-27 DIAGNOSIS — Z5321 Procedure and treatment not carried out due to patient leaving prior to being seen by health care provider: Secondary | ICD-10-CM | POA: Insufficient documentation

## 2019-04-27 LAB — CBG MONITORING, ED: Glucose-Capillary: 463 mg/dL — ABNORMAL HIGH (ref 70–99)

## 2019-04-27 LAB — CBC WITH DIFFERENTIAL/PLATELET
Abs Immature Granulocytes: 0.04 10*3/uL (ref 0.00–0.07)
Basophils Absolute: 0.1 10*3/uL (ref 0.0–0.1)
Basophils Relative: 1 %
Eosinophils Absolute: 0.2 10*3/uL (ref 0.0–0.5)
Eosinophils Relative: 3 %
HCT: 39.3 % (ref 39.0–52.0)
Hemoglobin: 12.8 g/dL — ABNORMAL LOW (ref 13.0–17.0)
Immature Granulocytes: 1 %
Lymphocytes Relative: 15 %
Lymphs Abs: 1 10*3/uL (ref 0.7–4.0)
MCH: 30 pg (ref 26.0–34.0)
MCHC: 32.6 g/dL (ref 30.0–36.0)
MCV: 92 fL (ref 80.0–100.0)
Monocytes Absolute: 0.7 10*3/uL (ref 0.1–1.0)
Monocytes Relative: 11 %
Neutro Abs: 4.7 10*3/uL (ref 1.7–7.7)
Neutrophils Relative %: 69 %
Platelets: 243 10*3/uL (ref 150–400)
RBC: 4.27 MIL/uL (ref 4.22–5.81)
RDW: 14 % (ref 11.5–15.5)
WBC: 6.8 10*3/uL (ref 4.0–10.5)
nRBC: 0 % (ref 0.0–0.2)

## 2019-04-27 LAB — COMPREHENSIVE METABOLIC PANEL
ALT: 24 U/L (ref 0–44)
AST: 18 U/L (ref 15–41)
Albumin: 4.2 g/dL (ref 3.5–5.0)
Alkaline Phosphatase: 74 U/L (ref 38–126)
Anion gap: 12 (ref 5–15)
BUN: 36 mg/dL — ABNORMAL HIGH (ref 8–23)
CO2: 25 mmol/L (ref 22–32)
Calcium: 9.3 mg/dL (ref 8.9–10.3)
Chloride: 96 mmol/L — ABNORMAL LOW (ref 98–111)
Creatinine, Ser: 1.48 mg/dL — ABNORMAL HIGH (ref 0.61–1.24)
GFR calc Af Amer: 56 mL/min — ABNORMAL LOW (ref >60)
GFR calc non Af Amer: 48 mL/min — ABNORMAL LOW (ref >60)
Glucose, Bld: 474 mg/dL — ABNORMAL HIGH (ref 70–99)
Potassium: 4.2 mmol/L (ref 3.5–5.1)
Sodium: 133 mmol/L — ABNORMAL LOW (ref 135–145)
Total Bilirubin: 0.5 mg/dL (ref 0.3–1.2)
Total Protein: 7.3 g/dL (ref 6.5–8.1)

## 2019-04-27 NOTE — ED Triage Notes (Signed)
Pt here for hyperglycemia; CBG here was 477 pt instructed per [place] to go to ED for further eval

## 2019-04-27 NOTE — ED Notes (Signed)
CBG 477

## 2019-04-27 NOTE — ED Triage Notes (Addendum)
PT seen at urgent care and told to come to ED for further eval for hyperglycemia. PT states increased thirst and some headaches with high blood pressure over the past week. PT states he last had insulin at 1100 today. PT states he has had 5 tick bites in the past 2 weeks.

## 2019-04-28 ENCOUNTER — Encounter: Payer: Self-pay | Admitting: "Endocrinology

## 2019-04-28 ENCOUNTER — Ambulatory Visit (INDEPENDENT_AMBULATORY_CARE_PROVIDER_SITE_OTHER): Payer: Medicare HMO | Admitting: "Endocrinology

## 2019-04-28 VITALS — HR 97 | Ht 65.0 in | Wt 221.6 lb

## 2019-04-28 DIAGNOSIS — E1159 Type 2 diabetes mellitus with other circulatory complications: Secondary | ICD-10-CM | POA: Diagnosis not present

## 2019-04-28 MED ORDER — HUMULIN R U-500 KWIKPEN 500 UNIT/ML ~~LOC~~ SOPN: 50.0000 [IU] | PEN_INJECTOR | Freq: Three times a day (TID) | SUBCUTANEOUS | 0 refills | Status: DC

## 2019-04-28 NOTE — Progress Notes (Signed)
04/28/2019   Endocrinology follow-up note   Subjective:    Patient ID: Paul Baker, male    DOB: 1950-08-02, PCP [name], MD   Past Medical History:  Diagnosis Date  . Arthritis   . Asthma   . Colon polyp   . Coronary atherosclerosis of native coronary artery    a. 2011: cath showing 90% stenosis along small non-dominant RCA (too small for PCI). b. 01/2018: cath showing nonobstructive CAD with 60 to 70% proximal to mid nondominant RCA stenosis, 50% mid LAD and 40 to 50% OM1  . DVT (deep venous thrombosis) ([place]) 2005   Right arm  . Essential hypertension, benign   . Headache   . History of transfusion   . Hypersomnia    CPAP of 16 cm, diagnosed with AHI of 60 in 2012,[place] 21- narcolepsy?  [name] Hypothyroidism   . Mixed hyperlipidemia   . Morbid obesity ([place])   . MRSA (methicillin resistant staph aureus) culture positive    08/2012  . Narcolepsy   . OSA (obstructive sleep apnea)    CPAP  . Osteoarthritis   . Pneumonia   . Psoriasis   . Pulmonary embolism ([place]) 2004  . RLS (restless legs syndrome)   . Rotator cuff disorder    Left  . Septic arthritis of knee, left ([place])   . Skin cancer, basal cell   . Type 2 diabetes mellitus ([place])    Past Surgical History:  Procedure Laterality Date  . BACK SURGERY    . CATARACT EXTRACTION Bilateral   . COLONOSCOPY    . CYST REMOVAL TRUNK     from back  . EYE SURGERY Left 2016   laser to left eye  . HIP SURGERY     bone removed from both sides of hip  . KNEE ARTHROTOMY Right 12/04/2014   Procedure: KNEE ARTHROTOMY PATELLA LIGAMENT RECONSTRUSION AND REPAIR RIGHT KNEE;  Surgeon: Paralee Cancel, MD;  Location: [facility];  Service: Orthopedics;  Laterality: Right;  . KNEE SURGERY     X 25 TIMES  . LEFT HEART CATH AND CORONARY ANGIOGRAPHY N/A 01/17/2018   Procedure: LEFT HEART CATH AND CORONARY ANGIOGRAPHY;  Surgeon: Belva Crome, MD;  Location: [facility];  Service: Cardiovascular;  Laterality: N/A;  . LUMBAR DISC  SURGERY     Left L3, L4, L5 discecotomy with decompression of L4 root  . TONSILLECTOMY    . TOTAL KNEE ARTHROPLASTY  2003   LEFT  . TOTAL KNEE ARTHROPLASTY Right 03/23/2014   Procedure: RIGHT TOTAL KNEE ARTHROPLASTY AND REMOVAL RIGHT TIBIAL  DEEP IMPLANT STAPLE;  Surgeon: Paralee Cancel, MD;  Location: [facility];  Service: Orthopedics;  Laterality: Right;  . TOTAL KNEE REVISION  2005   LEFT  . WRIST SURGERY     Social History   Socioeconomic History  . Marital status: Married    Spouse name: Toney Reil  . Number of children: 2  . Years of education: college  . Highest education level: Not on file  Occupational History  . Occupation: Disabled    Employer: UNEMPLOYED  Tobacco Use  . Smoking status: Former Smoker    Packs/day: 1.50    Years: 10.00    Pack years: 15.00    Types: Cigarettes    Start date: 06/19/1967    Quit date: 01/03/1995    Years since quitting: 24.3  . Smokeless tobacco: Never Used  Substance and Sexual Activity  . Alcohol use: No    Alcohol/week: 0.0 standard  drinks    Comment: quit drinking in 07/86  . Drug use: No  . Sexual activity: Yes    Partners: Female  Other Topics Concern  . Not on file  Social History Narrative    69 year old, right-handed, caucasian male with a past medical history of obesity, hypertension, hyperlipidemia, diabetes, obstructive sleep apnea, presenting with frequent nighttime awakenings, excessive daytime sleepiness, also transient confusional episodes.RLS and one beosity, OSA on CPAP with AHI of 3.2 and  setting of 16 cm water , [facility] .   Social Determinants of Health   Financial Resource Strain:   . Difficulty of Paying Living Expenses:   Food Insecurity:   . Worried About Charity fundraiser in the Last Year:   . Arboriculturist in the Last Year:   Transportation Needs:   . Film/video editor (Medical):   [name] Lack of Transportation (Non-Medical):   Physical Activity:   . Days of Exercise per Week:   . Minutes of  Exercise per Session:   Stress:   . Feeling of Stress :   Social Connections:   . Frequency of Communication with Friends and Family:   . Frequency of Social Gatherings with Friends and Family:   . Attends Religious Services:   . Active Member of Clubs or Organizations:   . Attends Archivist Meetings:   [name] Marital Status:    Outpatient Encounter Medications as of 04/28/2019  Medication Sig  . albuterol (VENTOLIN HFA) 108 (90 Base) MCG/ACT inhaler Inhale 2 puffs into the lungs every 6 (six) hours as needed for wheezing or shortness of breath.  . alfuzosin (UROXATRAL) 10 MG 24 hr tablet Take 10 mg by mouth daily with breakfast.  . amLODipine (NORVASC) 5 MG tablet Take 1 tablet (5 mg total) by mouth daily.  . B-D ULTRAFINE III SHORT PEN 31G X 8 MM MISC USING FIVE TIMES DAILY.  [name] clopidogrel (PLAVIX) 75 MG tablet Take 1 tablet (75 mg total) by mouth daily.  . Continuous Blood Gluc Receiver (DEXCOM G6 RECEIVER) DEVI 1 Piece by Does not apply route as needed.  . Continuous Blood Gluc Sensor (DEXCOM G6 SENSOR) MISC 4 Pieces by Does not apply route once a week.  Arna Medici 137 MCG tablet TAKE 1 TABLET BY MOUTH ONCE DAILY BEFORE BREAKFAST  . fluticasone furoate-vilanterol (BREO ELLIPTA) 200-25 MCG/INH AEPB Inhale 1 puff into the lungs daily.  . furosemide (LASIX) 40 MG tablet Take 40 mg by mouth. Take 40 mg alternating with 80 mg Daily  . glipiZIDE (GLUCOTROL XL) 10 MG 24 hr tablet Take 1 tablet by mouth once daily with breakfast  . insulin regular human CONCENTRATED (HUMULIN R U-500 KWIKPEN) 500 UNIT/ML kwikpen Inject 50-60 Units into the skin 3 (three) times daily with meals. Per sliding scale.  . Insulin Syringe-Needle U-100 (INSULIN SYRINGE 1CC/31GX5/16") 31G X 5/16" 1 ML MISC 1 each by Does not apply route 2 (two) times daily.  . isosorbide mononitrate (IMDUR) 60 MG 24 hr tablet Take 60 mg ( 1 Tablet)  in the AM and Take 30 mg ( 1/2 Tablet)  in the PM  . loratadine (CLARITIN) 10 MG  tablet Take 10 mg by mouth daily as needed for allergies.   [name] losartan (COZAAR) 50 MG tablet Take 1 tablet (50 mg total) by mouth daily.  . meclizine (ANTIVERT) 25 MG tablet Take 1 tablet (25 mg total) by mouth 3 (three) times daily as needed for dizziness or nausea.  . metFORMIN (  GLUCOPHAGE) 1000 MG tablet Take 0.5 tablets (500 mg total) by mouth 2 (two) times daily with a meal.  . metoprolol succinate (TOPROL-XL) 50 MG 24 hr tablet Take 1 tablet by mouth twice daily  . modafinil (PROVIGIL) 200 MG tablet TAKE ONE TABLET BY MOUTH ONE TIME DAILY   . nitroGLYCERIN (NITROSTAT) 0.4 MG SL tablet DISSOLVE 1 TABLET UNDER TONGUE EVERY 5 MINUTES UP TO 15 MIN FOR CHESTPAIN. IF NO RELIEF CALL 911.  [name] ONE TOUCH ULTRA TEST test strip TEST BLOOD SUGAR UP TO 4 TIMES DAILY.  [name] ONETOUCH DELICA LANCETS 99991111 MISC USE AS DIRECTED TO TEST BLOOD SUGAR 4 TIMES DAILY.  . pravastatin (PRAVACHOL) 40 MG tablet Take 1 tablet (40 mg total) by mouth every evening.  [name] rOPINIRole (REQUIP XL) 4 MG 24 hr tablet Take 1 tablet (4 mg total) by mouth at bedtime.  [name] rOPINIRole (REQUIP) 4 MG tablet Take 0.5 tablets (2 mg total) by mouth 2 (two) times daily.  . sodium chloride (OCEAN) 0.65 % SOLN nasal spray Place 1-2 sprays into both nostrils daily as needed for congestion.  . Vitamin D, Ergocalciferol, (DRISDOL) 1.25 MG (50000 UNIT) CAPS capsule Take 1 capsule by mouth once a week  . [DISCONTINUED] insulin regular human CONCENTRATED (HUMULIN R U-500 KWIKPEN) 500 UNIT/ML kwikpen Inject 30-35 Units into the skin 3 (three) times daily with meals. Per sliding scale.   No facility-administered encounter medications on file as of 04/28/2019.   ALLERGIES: Allergies  Allergen Reactions  . Tape Rash    Pulls off skin  . Cardizem [Diltiazem Hcl]     Edema   . Cardura [Doxazosin Mesylate]     Headaches / cramps  . Lipitor [Atorvastatin] Other (See Comments)    Leg cramps  . Paxil [Paroxetine Hcl]     Unknown reaction   . Codeine Rash  and Other (See Comments)    Headache   . Gabapentin Rash   VACCINATION STATUS: Immunization History  Administered Date(s) Administered  . Influenza Split 09/18/2012  . Influenza,inj,Quad PF,6+ Mos 10/13/2013, 10/06/2014, 09/11/2018  . Influenza-Unspecified 12/01/2015, 11/08/2016  . PFIZER SARS-COV-2 Vaccination 03/06/2019, 03/27/2019  . Pneumococcal Polysaccharide-23 10/02/2004, 10/03/2011  . Td 05/24/2006    Diabetes He presents for his follow-up diabetic visit. He has type 2 diabetes mellitus. Onset time: Was diagnosed at approximate age of 19 years. His disease course has been improving. There are no hypoglycemic associated symptoms. Pertinent negatives for hypoglycemia include no confusion, headaches, pallor or seizures. Pertinent negatives for diabetes include no chest pain, no fatigue, no polydipsia, no polyphagia, no polyuria and no weakness. There are no hypoglycemic complications. Symptoms are improving. Diabetic complications include heart disease. Risk factors for coronary artery disease include diabetes mellitus, dyslipidemia, male sex, obesity, sedentary lifestyle and tobacco exposure. Current diabetic treatment includes intensive insulin program. He is compliant with treatment most of the time. His weight is fluctuating minimally. He is following a generally unhealthy diet. He has had a previous visit with a dietitian. He never participates in exercise. His home blood glucose trend is decreasing steadily. His breakfast blood glucose range is generally >200 mg/dl. His lunch blood glucose range is generally >200 mg/dl. His dinner blood glucose range is generally >200 mg/dl. His bedtime blood glucose range is generally >200 mg/dl. His overall blood glucose range is 180-200 mg/dl. (He presents with his Dexcom CGM printout showing 44% time in range, 53% above range.  His average blood glucose is 203,  Recent point-of-care A1c is 9% improving from  10.2%.   ) An ACE inhibitor/angiotensin II  receptor blocker is being taken. He sees a podiatrist.Eye exam is current.  Hyperlipidemia This is a chronic problem. The current episode started more than 1 year ago. The problem is controlled. Exacerbating diseases include diabetes, hypothyroidism and obesity. Pertinent negatives include no chest pain, myalgias or shortness of breath. Current antihyperlipidemic treatment includes statins. Risk factors for coronary artery disease include dyslipidemia, diabetes mellitus, hypertension, male sex, obesity and a sedentary lifestyle.  Hypertension This is a chronic problem. The current episode started more than 1 year ago. The problem is controlled. Pertinent negatives include no chest pain, headaches, neck pain, palpitations or shortness of breath. Risk factors for coronary artery disease include diabetes mellitus, dyslipidemia, male gender, obesity and sedentary lifestyle. Past treatments include angiotensin blockers. Hypertensive end-organ damage includes CAD/MI. Identifiable causes of hypertension include a thyroid problem.  Thyroid Problem Presents for follow-up visit. Patient reports no constipation, diarrhea, fatigue or palpitations. The symptoms have been improving. Past treatments include levothyroxine. His past medical history is significant for diabetes and hyperlipidemia.    Review of systems  Constitutional: + Minimally fluctuating body weight,  current  Body mass index is 36.88 kg/m. , no fatigue, no subjective hyperthermia, no subjective hypothermia Eyes: no blurry vision, no xerophthalmia ENT: no sore throat, no nodules palpated in throat, no dysphagia/odynophagia, no hoarseness Cardiovascular: no Chest Pain, no Shortness of Breath, no palpitations, no leg swelling Respiratory: no cough, no shortness of breath Gastrointestinal: no Nausea/Vomiting/Diarhhea Musculoskeletal: no muscle/joint aches Skin: no rashes, no hyperemia Neurological: no tremors, no numbness, no tingling, no  dizziness Psychiatric: no depression, no anxiety    Objective:    Pulse 97   Ht 5\' 5"  (1.651 m)   Wt 221 lb 9.6 oz (100.5 kg)   BMI 36.88 kg/m   Wt Readings from Last 3 Encounters:  04/28/19 221 lb 9.6 oz (100.5 kg)  04/27/19 218 lb (98.9 kg)  04/21/19 221 lb 12.8 oz (100.6 kg)      Physical Exam- Limited  Constitutional:  Body mass index is 36.88 kg/m. , not in acute distress, normal state of mind Eyes:  EOMI, no exophthalmos Neck: Supple Thyroid: No gross goiter Respiratory: Adequate breathing efforts Musculoskeletal: no gross deformities, strength intact in all four extremities, no gross restriction of joint movements Skin:  no rashes, no hyperemia Neurological: no tremor with outstretched hands,    CMP Latest Ref Rng & Units 04/27/2019 02/18/2019 02/06/2019  Glucose 70 - 99 mg/dL 474(H) 344(H) 296(H)  BUN 8 - 23 mg/dL 36(H) 28(H) 30(H)  Creatinine 0.61 - 1.24 mg/dL 1.48(H) 1.06 1.11  Sodium 135 - 145 mmol/L 133(L) 137 137  Potassium 3.5 - 5.1 mmol/L 4.2 3.9 4.7  Chloride 98 - 111 mmol/L 96(L) 102 102  CO2 22 - 32 mmol/L 25 26 25   Calcium 8.9 - 10.3 mg/dL 9.3 9.1 9.8  Total Protein 6.5 - 8.1 g/dL 7.3 7.0 6.8  Total Bilirubin 0.3 - 1.2 mg/dL 0.5 0.7 0.3  Alkaline Phos 38 - 126 U/L 74 54 -  AST 15 - 41 U/L 18 19 12   ALT 0 - 44 U/L 24 21 15      Diabetic Labs (most recent): Lab Results  Component Value Date   HGBA1C 9.2 (H) 02/18/2019   HGBA1C 9.0 (A) 01/14/2019   HGBA1C 10.2 (H) 09/18/2018   Lipid Panel     Component Value Date/Time   CHOL 201 (H) 02/19/2019 0519   CHOL 148 08/19/2015 0914  TRIG 117 02/19/2019 0519   HDL 49 02/19/2019 0519   HDL 53 08/19/2015 0914   CHOLHDL 4.1 02/19/2019 0519   VLDL 23 02/19/2019 0519   LDLCALC 129 (H) 02/19/2019 0519   LDLCALC 118 (H) 02/06/2019 0813      Assessment & Plan:   1. Uncontrolled type 2 diabetes mellitus with circulatory complication, with long-term current use of insulin ([facility]CC)  -His diabetes is   complicated by coronary artery disease and patient remains at an extremely high risk for more acute and chronic complications of diabetes which include CAD, CVA, CKD, retinopathy, and neuropathy. These are all discussed in detail with the patient.  -He is utilizing his Dexcom CGM device.  He returns with average blood glucose of 218, current range 37%, Average 61.5%.  His recent A1c was 9.2% unchanged from his previous A1c of 9%.  -He did not document no report of hypoglycemia.     Glucose logs and insulin administration records pertaining to this visit,  to be scanned into patient's records.  Recent labs reviewed.  - I have re-counseled the patient on diet management and weight loss  by adopting a carbohydrate restricted / protein rich  Diet.  - he  admits there is a room for improvement in his diet and drink choices. -  Suggestion is made for him to avoid simple carbohydrates  from his diet including Cakes, Sweet Desserts / Pastries, Ice Cream, Soda (diet and regular), Sweet Tea, Candies, Chips, Cookies, Sweet Pastries,  Store Bought Juices, Alcohol in Excess of  1-2 drinks a day, Artificial Sweeteners, Coffee Creamer, and "Sugar-free" Products. This will help patient to have stable blood glucose profile and potentially avoid unintended weight gain.   - Patient is advised to stick to a routine mealtimes to eat 3 meals  a day and avoid unnecessary snacks ( to snack only to correct hypoglycemia).   - I have approached patient with the following individualized plan to manage diabetes and patient agrees.  -He will continue to need intensive treatment with higher dose of insulin.  Benefiting from U500 versus [place] and regular insulin.    -He is running postprandial hyperglycemia, advised to increase his insulin U50 0 to 60 units with breakfast, 60 units with lunch, and continue at 15 units with supper  for Premeal blood glucose greater than 90 mg per DL.  He will continue to use his CGM device,  Dexcom.    -Patient is encouraged to call clinic for blood glucose levels less than 70 or above 200 mg /dl x 3.   -He is advised to continue Metformin 500 mg p.o. twice daily.  He is advised to continue glipizide 10 mg XL p.o. daily with breakfast.  2) BP/HTN: His blood pressure is not controlled to target.     He is advised to continue his current blood pressure medications including amlodipine, metoprolol, and losartan 50 mg p.o daily at breakfast.    3) Lipids/HPL: His recent labs show LDL increasing to 129 from 80.  He will continue to need intervention with statin, currently on pravastatin 40 milligrams p.o. nightly.    4)  Weight/Diet: His BMI 36.8-complicating his diabetes care.  He is a candidate for modest weight loss.  CDE consult in progress, exercise, and carbohydrates information provided.  5) hypothyroidism -His recent thyroid function tests are consistent with appropriate replacement.  He is advised to continue  levothyroxine 137 mcg p.o. every morning.    - We discussed about the correct intake of  his thyroid hormone, on empty stomach at fasting, with water, separated by at least 30 minutes from breakfast and other medications,  and separated by more than 4 hours from calcium, iron, multivitamins, acid reflux medications (PPIs). -Patient is made aware of the fact that thyroid hormone replacement is needed for life, dose to be adjusted by periodic monitoring of thyroid function tests.   6) Chronic Care/Health Maintenance:  -Patient is on ACEI/ARB and Statin medications and encouraged to continue to follow up with Ophthalmology, Podiatrist at least yearly or according to recommendations, and advised to  stay away from smoking. I have recommended yearly flu vaccine and pneumonia vaccination at least every 5 years; moderate intensity exercise for up to 150 minutes weekly; and  sleep for at least 7 hours a day.  - I advised patient to maintain close follow up with [name], MD for primary care needs.   - Time spent on this patient care encounter:  35 min, of which > 50% was spent in  counseling and the rest reviewing his blood glucose logs , discussing his hypoglycemia and hyperglycemia episodes, reviewing his current and  previous labs / studies  ( including abstraction from other facilities) and medications  doses and developing a  long term treatment plan and documenting his care.   Please refer to Patient Instructions for Blood Glucose Monitoring and Insulin/Medications Dosing Guide"  in media tab for additional information. Please  also refer to " Patient Self Inventory" in the Media  tab for reviewed elements of pertinent patient history.  Paul Baker participated in the discussions, expressed understanding, and voiced agreement with the above plans.  All questions were answered to his satisfaction. he is encouraged to contact clinic should he have any questions or concerns prior to his return visit.   Follow up plan: -Return keep his regular appt.  Glade Lloyd, MD Phone: [phone]  Fax: [phone]  This note was partially dictated with voice recognition software. Similar sounding words can be transcribed inadequately or may not  be corrected upon review.  04/28/2019, 10:30 AM

## 2019-04-28 NOTE — Patient Instructions (Signed)

## 2019-05-01 DIAGNOSIS — N401 Enlarged prostate with lower urinary tract symptoms: Secondary | ICD-10-CM | POA: Diagnosis not present

## 2019-05-01 DIAGNOSIS — R3914 Feeling of incomplete bladder emptying: Secondary | ICD-10-CM | POA: Diagnosis not present

## 2019-05-01 DIAGNOSIS — N5201 Erectile dysfunction due to arterial insufficiency: Secondary | ICD-10-CM | POA: Diagnosis not present

## 2019-05-05 ENCOUNTER — Other Ambulatory Visit: Payer: Self-pay | Admitting: "Endocrinology

## 2019-05-05 DIAGNOSIS — M25512 Pain in left shoulder: Secondary | ICD-10-CM | POA: Diagnosis not present

## 2019-05-05 DIAGNOSIS — M25511 Pain in right shoulder: Secondary | ICD-10-CM | POA: Insufficient documentation

## 2019-05-05 DIAGNOSIS — M7542 Impingement syndrome of left shoulder: Secondary | ICD-10-CM | POA: Diagnosis not present

## 2019-05-09 ENCOUNTER — Ambulatory Visit (INDEPENDENT_AMBULATORY_CARE_PROVIDER_SITE_OTHER): Payer: Medicare HMO | Admitting: "Endocrinology

## 2019-05-09 ENCOUNTER — Other Ambulatory Visit: Payer: Self-pay

## 2019-05-09 ENCOUNTER — Encounter: Payer: Self-pay | Admitting: "Endocrinology

## 2019-05-09 VITALS — BP 133/72 | HR 69 | Ht 65.0 in | Wt 227.2 lb

## 2019-05-09 DIAGNOSIS — E038 Other specified hypothyroidism: Secondary | ICD-10-CM | POA: Diagnosis not present

## 2019-05-09 DIAGNOSIS — E1159 Type 2 diabetes mellitus with other circulatory complications: Secondary | ICD-10-CM | POA: Diagnosis not present

## 2019-05-09 DIAGNOSIS — E782 Mixed hyperlipidemia: Secondary | ICD-10-CM | POA: Diagnosis not present

## 2019-05-09 DIAGNOSIS — I10 Essential (primary) hypertension: Secondary | ICD-10-CM

## 2019-05-09 MED ORDER — HUMULIN R U-500 KWIKPEN 500 UNIT/ML ~~LOC~~ SOPN: 50.0000 [IU] | PEN_INJECTOR | Freq: Three times a day (TID) | SUBCUTANEOUS | 2 refills | Status: DC

## 2019-05-09 NOTE — Patient Instructions (Signed)

## 2019-05-09 NOTE — Progress Notes (Signed)
05/09/2019   Endocrinology follow-up note   Subjective:    Patient ID: Paul Baker, male    DOB: 1950-06-23, PCP [name], MD   Past Medical History:  Diagnosis Date  . Arthritis   . Asthma   . Colon polyp   . Coronary atherosclerosis of native coronary artery    a. 2011: cath showing 90% stenosis along small non-dominant RCA (too small for PCI). b. 01/2018: cath showing nonobstructive CAD with 60 to 70% proximal to mid nondominant RCA stenosis, 50% mid LAD and 40 to 50% OM1  . DVT (deep venous thrombosis) ([place]) 2005   Right arm  . Essential hypertension, benign   . Headache   . History of transfusion   . Hypersomnia    CPAP of 16 cm, diagnosed with AHI of 60 in 2012,[place] 21- narcolepsy?  [name] Hypothyroidism   . Mixed hyperlipidemia   . Morbid obesity ([place])   . MRSA (methicillin resistant staph aureus) culture positive    08/2012  . Narcolepsy   . OSA (obstructive sleep apnea)    CPAP  . Osteoarthritis   . Pneumonia   . Psoriasis   . Pulmonary embolism ([place]) 2004  . RLS (restless legs syndrome)   . Rotator cuff disorder    Left  . Septic arthritis of knee, left ([place])   . Skin cancer, basal cell   . Type 2 diabetes mellitus ([place])    Past Surgical History:  Procedure Laterality Date  . BACK SURGERY    . CATARACT EXTRACTION Bilateral   . COLONOSCOPY    . CYST REMOVAL TRUNK     from back  . EYE SURGERY Left 2016   laser to left eye  . HIP SURGERY     bone removed from both sides of hip  . KNEE ARTHROTOMY Right 12/04/2014   Procedure: KNEE ARTHROTOMY PATELLA LIGAMENT RECONSTRUSION AND REPAIR RIGHT KNEE;  Surgeon: Paralee Cancel, MD;  Location: [facility];  Service: Orthopedics;  Laterality: Right;  . KNEE SURGERY     X 25 TIMES  . LEFT HEART CATH AND CORONARY ANGIOGRAPHY N/A 01/17/2018   Procedure: LEFT HEART CATH AND CORONARY ANGIOGRAPHY;  Surgeon: Belva Crome, MD;  Location: [facility];  Service: Cardiovascular;  Laterality: N/A;  . LUMBAR DISC  SURGERY     Left L3, L4, L5 discecotomy with decompression of L4 root  . TONSILLECTOMY    . TOTAL KNEE ARTHROPLASTY  2003   LEFT  . TOTAL KNEE ARTHROPLASTY Right 03/23/2014   Procedure: RIGHT TOTAL KNEE ARTHROPLASTY AND REMOVAL RIGHT TIBIAL  DEEP IMPLANT STAPLE;  Surgeon: Paralee Cancel, MD;  Location: [facility];  Service: Orthopedics;  Laterality: Right;  . TOTAL KNEE REVISION  2005   LEFT  . WRIST SURGERY     Social History   Socioeconomic History  . Marital status: Married    Spouse name: Toney Reil  . Number of children: 2  . Years of education: college  . Highest education level: Not on file  Occupational History  . Occupation: Disabled    Employer: UNEMPLOYED  Tobacco Use  . Smoking status: Former Smoker    Packs/day: 1.50    Years: 10.00    Pack years: 15.00    Types: Cigarettes    Start date: 06/19/1967    Quit date: 01/03/1995    Years since quitting: 24.3  . Smokeless tobacco: Never Used  Substance and Sexual Activity  . Alcohol use: No    Alcohol/week: 0.0 standard  drinks    Comment: quit drinking in 07/86  . Drug use: No  . Sexual activity: Yes    Partners: Female  Other Topics Concern  . Not on file  Social History Narrative    69 year old, right-handed, caucasian male with a past medical history of obesity, hypertension, hyperlipidemia, diabetes, obstructive sleep apnea, presenting with frequent nighttime awakenings, excessive daytime sleepiness, also transient confusional episodes.RLS and one beosity, OSA on CPAP with AHI of 3.2 and  setting of 16 cm water , [facility] .   Social Determinants of Health   Financial Resource Strain:   . Difficulty of Paying Living Expenses:   Food Insecurity:   . Worried About Charity fundraiser in the Last Year:   . Arboriculturist in the Last Year:   Transportation Needs:   . Film/video editor (Medical):   [name] Lack of Transportation (Non-Medical):   Physical Activity:   . Days of Exercise per Week:   . Minutes of  Exercise per Session:   Stress:   . Feeling of Stress :   Social Connections:   . Frequency of Communication with Friends and Family:   . Frequency of Social Gatherings with Friends and Family:   . Attends Religious Services:   . Active Member of Clubs or Organizations:   . Attends Archivist Meetings:   [name] Marital Status:    Outpatient Encounter Medications as of 05/09/2019  Medication Sig  . albuterol (VENTOLIN HFA) 108 (90 Base) MCG/ACT inhaler Inhale 2 puffs into the lungs every 6 (six) hours as needed for wheezing or shortness of breath.  . alfuzosin (UROXATRAL) 10 MG 24 hr tablet Take 10 mg by mouth daily with breakfast.  . amLODipine (NORVASC) 5 MG tablet Take 1 tablet (5 mg total) by mouth daily.  . B-D ULTRAFINE III SHORT PEN 31G X 8 MM MISC USING FIVE TIMES DAILY.  [name] clopidogrel (PLAVIX) 75 MG tablet Take 1 tablet (75 mg total) by mouth daily.  . Continuous Blood Gluc Receiver (DEXCOM G6 RECEIVER) DEVI 1 Piece by Does not apply route as needed.  . Continuous Blood Gluc Sensor (DEXCOM G6 SENSOR) MISC 4 Pieces by Does not apply route once a week.  Arna Medici 137 MCG tablet TAKE 1 TABLET BY MOUTH ONCE DAILY BEFORE BREAKFAST  . fluticasone furoate-vilanterol (BREO ELLIPTA) 200-25 MCG/INH AEPB Inhale 1 puff into the lungs daily.  . furosemide (LASIX) 40 MG tablet Take 40 mg by mouth. Take 40 mg alternating with 80 mg Daily  . glipiZIDE (GLUCOTROL XL) 10 MG 24 hr tablet Take 1 tablet by mouth once daily with breakfast  . insulin regular human CONCENTRATED (HUMULIN R U-500 KWIKPEN) 500 UNIT/ML kwikpen Inject 50-70 Units into the skin 3 (three) times daily with meals. Per sliding scale.  . Insulin Syringe-Needle U-100 (INSULIN SYRINGE 1CC/31GX5/16") 31G X 5/16" 1 ML MISC 1 each by Does not apply route 2 (two) times daily.  . isosorbide mononitrate (IMDUR) 60 MG 24 hr tablet Take 60 mg ( 1 Tablet)  in the AM and Take 30 mg ( 1/2 Tablet)  in the PM  . loratadine (CLARITIN) 10 MG  tablet Take 10 mg by mouth daily as needed for allergies.   [name] losartan (COZAAR) 50 MG tablet Take 1 tablet (50 mg total) by mouth daily.  . meclizine (ANTIVERT) 25 MG tablet Take 1 tablet (25 mg total) by mouth 3 (three) times daily as needed for dizziness or nausea.  . metFORMIN (  GLUCOPHAGE) 1000 MG tablet Take 0.5 tablets (500 mg total) by mouth 2 (two) times daily with a meal.  . metoprolol succinate (TOPROL-XL) 50 MG 24 hr tablet Take 1 tablet by mouth twice daily  . modafinil (PROVIGIL) 200 MG tablet TAKE ONE TABLET BY MOUTH ONE TIME DAILY   . nitroGLYCERIN (NITROSTAT) 0.4 MG SL tablet DISSOLVE 1 TABLET UNDER TONGUE EVERY 5 MINUTES UP TO 15 MIN FOR CHESTPAIN. IF NO RELIEF CALL 911.  [name] ONE TOUCH ULTRA TEST test strip TEST BLOOD SUGAR UP TO 4 TIMES DAILY.  [name] ONETOUCH DELICA LANCETS [id number] MISC USE AS DIRECTED TO TEST BLOOD SUGAR 4 TIMES DAILY.  . pravastatin (PRAVACHOL) 40 MG tablet Take 1 tablet (40 mg total) by mouth every evening.  [name] rOPINIRole (REQUIP XL) 4 MG 24 hr tablet Take 1 tablet (4 mg total) by mouth at bedtime.  [name] rOPINIRole (REQUIP) 4 MG tablet Take 0.5 tablets (2 mg total) by mouth 2 (two) times daily.  . sodium chloride (OCEAN) 0.65 % SOLN nasal spray Place 1-2 sprays into both nostrils daily as needed for congestion.  . Vitamin D, Ergocalciferol, (DRISDOL) 1.25 MG (50000 UNIT) CAPS capsule Take 1 capsule by mouth once a week  . [DISCONTINUED] insulin regular human CONCENTRATED (HUMULIN R U-500 KWIKPEN) 500 UNIT/ML kwikpen Inject 50-60 Units into the skin 3 (three) times daily with meals. Per sliding scale.   No facility-administered encounter medications on file as of 05/09/2019.   ALLERGIES: Allergies  Allergen Reactions  . Tape Rash    Pulls off skin  . Cardizem [Diltiazem Hcl]     Edema   . Cardura [Doxazosin Mesylate]     Headaches / cramps  . Lipitor [Atorvastatin] Other (See Comments)    Leg cramps  . Paxil [Paroxetine Hcl]     Unknown reaction   . Codeine Rash and  Other (See Comments)    Headache   . Gabapentin Rash   VACCINATION STATUS: Immunization History  Administered Date(s) Administered  . Influenza Split 09/18/2012  . Influenza,inj,Quad PF,6+ Mos 10/13/2013, 10/06/2014, 09/11/2018  . Influenza-Unspecified 12/01/2015, 11/08/2016  . PFIZER SARS-COV-2 Vaccination 03/06/2019, 03/27/2019  . Pneumococcal Polysaccharide-23 10/02/2004, 10/03/2011  . Td 05/24/2006    Diabetes He presents for his follow-up diabetic visit. He has type 2 diabetes mellitus. Onset time: Was diagnosed at approximate age of 89 years. His disease course has been improving. There are no hypoglycemic associated symptoms. Pertinent negatives for hypoglycemia include no confusion, headaches, pallor or seizures. Pertinent negatives for diabetes include no chest pain, no fatigue, no polydipsia, no polyphagia, no polyuria and no weakness. There are no hypoglycemic complications. Symptoms are improving. Diabetic complications include heart disease. Risk factors for coronary artery disease include diabetes mellitus, dyslipidemia, male sex, obesity, sedentary lifestyle and tobacco exposure. Current diabetic treatment includes intensive insulin program. He is compliant with treatment most of the time. His weight is increasing steadily. He has had a previous visit with a dietitian. He never participates in exercise. His home blood glucose trend is decreasing steadily. His breakfast blood glucose range is generally 140-180 mg/dl. His lunch blood glucose range is generally 180-200 mg/dl. His dinner blood glucose range is generally 180-200 mg/dl. His bedtime blood glucose range is generally 180-200 mg/dl. His overall blood glucose range is 140-180 mg/dl. (He presents with his Dexcom CGM printout showing 44% time in range, 53% above range.  His average blood glucose is 203,  Recent point-of-care A1c is 9% improving from 10.2%.   ) An ACE inhibitor/angiotensin  II receptor blocker is being taken. He  sees a podiatrist.Eye exam is current.  Hyperlipidemia This is a chronic problem. The current episode started more than 1 year ago. The problem is controlled. Exacerbating diseases include diabetes, hypothyroidism and obesity. Pertinent negatives include no chest pain, myalgias or shortness of breath. Current antihyperlipidemic treatment includes statins. Risk factors for coronary artery disease include dyslipidemia, diabetes mellitus, hypertension, male sex, obesity and a sedentary lifestyle.  Hypertension This is a chronic problem. The current episode started more than 1 year ago. The problem is controlled. Pertinent negatives include no chest pain, headaches, neck pain, palpitations or shortness of breath. Risk factors for coronary artery disease include diabetes mellitus, dyslipidemia, male gender, obesity and sedentary lifestyle. Past treatments include angiotensin blockers. Hypertensive end-organ damage includes CAD/MI. Identifiable causes of hypertension include a thyroid problem.  Thyroid Problem Presents for follow-up visit. Patient reports no constipation, diarrhea, fatigue or palpitations. The symptoms have been improving. Past treatments include levothyroxine. His past medical history is significant for diabetes and hyperlipidemia.     Review of systems  Constitutional: + gaining weight,  current  Body mass index is 37.81 kg/m. , no fatigue, no subjective hyperthermia, no subjective hypothermia Eyes: no blurry vision, no xerophthalmia ENT: no sore throat, no nodules palpated in throat, no dysphagia/odynophagia, no hoarseness Cardiovascular: no Chest Pain, no Shortness of Breath, no palpitations, no leg swelling Respiratory: no cough, no shortness of breath Gastrointestinal: no Nausea/Vomiting/Diarhhea Musculoskeletal:  + Chronic left shoulder pain due to rotator cuff.   Skin: no rashes, no hyperemia Neurological: no tremors, no numbness, no tingling, no dizziness Psychiatric: no  depression, no anxiety    Objective:    BP 133/72   Pulse 69   Ht 5\' 5"  (1.651 m)   Wt 227 lb 3.2 oz (103.1 kg)   BMI 37.81 kg/m   Wt Readings from Last 3 Encounters:  05/09/19 227 lb 3.2 oz (103.1 kg)  04/28/19 221 lb 9.6 oz (100.5 kg)  04/27/19 218 lb (98.9 kg)      Physical Exam- Limited  Constitutional:  Body mass index is 37.81 kg/m. , not in acute distress, normal state of mind Eyes:  EOMI, no exophthalmos Neck: Supple Thyroid: No gross goiter Respiratory: Adequate breathing efforts Musculoskeletal: no gross deformities, strength intact in all four extremities, no gross restriction of joint movements Skin:  no rashes, no hyperemia Neurological: no tremor with outstretched hands,     CMP Latest Ref Rng & Units 04/27/2019 02/18/2019 02/06/2019  Glucose 70 - 99 mg/dL 474(H) 344(H) 296(H)  BUN 8 - 23 mg/dL 36(H) 28(H) 30(H)  Creatinine 0.61 - 1.24 mg/dL 1.48(H) 1.06 1.11  Sodium 135 - 145 mmol/L 133(L) 137 137  Potassium 3.5 - 5.1 mmol/L 4.2 3.9 4.7  Chloride 98 - 111 mmol/L 96(L) 102 102  CO2 22 - 32 mmol/L 25 26 25   Calcium 8.9 - 10.3 mg/dL 9.3 9.1 9.8  Total Protein 6.5 - 8.1 g/dL 7.3 7.0 6.8  Total Bilirubin 0.3 - 1.2 mg/dL 0.5 0.7 0.3  Alkaline Phos 38 - 126 U/L 74 54 -  AST 15 - 41 U/L 18 19 12   ALT 0 - 44 U/L 24 21 15      Diabetic Labs (most recent): Lab Results  Component Value Date   HGBA1C 9.2 (H) 02/18/2019   HGBA1C 9.0 (A) 01/14/2019   HGBA1C 10.2 (H) 09/18/2018   Lipid Panel     Component Value Date/Time   CHOL 201 (H) 02/19/2019 [id number]  CHOL 148 08/19/2015 0914   TRIG 117 02/19/2019 0519   HDL 49 02/19/2019 0519   HDL 53 08/19/2015 0914   CHOLHDL 4.1 02/19/2019 0519   VLDL 23 02/19/2019 0519   LDLCALC 129 (H) 02/19/2019 0519   LDLCALC 118 (H) 02/06/2019 0813     Assessment & Plan:   1. Uncontrolled type 2 diabetes mellitus with circulatory complication, with long-term current use of insulin ([facility])  -His diabetes is  complicated by  coronary artery disease and patient remains at an extremely high risk for more acute and chronic complications of diabetes which include CAD, CVA, CKD, retinopathy, and neuropathy. These are all discussed in detail with the patient.  -He is utilizing his Dexcom CGM device.  He returns with significant improvement in his average blood glucose in the last 15 days, 158 mg per DL.  He is Dexcom CGM analyzed and shows 81% time range, 90% above range.   He has no hypoglycemia.    -He did not document no report of hypoglycemia.     Glucose logs and insulin administration records pertaining to this visit,  to be scanned into patient's records.  Recent labs reviewed.  - I have re-counseled the patient on diet management and weight loss  by adopting a carbohydrate restricted / protein rich  Diet.  - he  admits there is a room for improvement in his diet and drink choices. -  Suggestion is made for him to avoid simple carbohydrates  from his diet including Cakes, Sweet Desserts / Pastries, Ice Cream, Soda (diet and regular), Sweet Tea, Candies, Chips, Cookies, Sweet Pastries,  Store Bought Juices, Alcohol in Excess of  1-2 drinks a day, Artificial Sweeteners, Coffee Creamer, and "Sugar-free" Products. This will help patient to have stable blood glucose profile and potentially avoid unintended weight gain.   - Patient is advised to stick to a routine mealtimes to eat 3 meals  a day and avoid unnecessary snacks ( to snack only to correct hypoglycemia).   - I have approached patient with the following individualized plan to manage diabetes and patient agrees.  -He will continue to need intensive treatment with higher dose of insulin.  He has done very well on the U500 versus Tresiba and regular insulin.    -He is running postprandial hyperglycemia after breakfast, advised to increase his insulin U500 to 70 units with breakfast, 60 units with lunch, and continue at 50 units with supper  for Premeal blood  glucose greater than 90 mg per DL.  He will continue to use his CGM device, Dexcom.    -Patient is encouraged to call clinic for blood glucose levels less than 70 or above 200 mg /dl x 3.   -He is advised to continue Metformin 500 mg p.o. twice daily.  Hie is advised to continue glipizide 10 mg XL p.o. daily with breakfast, will be lowered to 5 mg XL on his next refills.     2) BP/HTN: His blood pressure is controlled to target.   He is advised to continue his current blood pressure medications including amlodipine, metoprolol, and losartan 50 mg p.o daily at breakfast.    3) Lipids/HPL: His recent labs show LDL increasing to 129 from 80.  He will continue to need intervention with statin, currently on pravastatin 40 milligrams p.o. nightly.    4)  Weight/Diet: His BMI 36.8-complicating his diabetes care.  He is a candidate for modest weight loss.  CDE consult in progress, exercise, and carbohydrates information provided.  5) hypothyroidism -His recent thyroid function tests are consistent with appropriate replacement.  He is advised to continue  levothyroxine 137 mcg p.o. every morning.     - We discussed about the correct intake of his thyroid hormone, on empty stomach at fasting, with water, separated by at least 30 minutes from breakfast and other medications,  and separated by more than 4 hours from calcium, iron, multivitamins, acid reflux medications (PPIs). -Patient is made aware of the fact that thyroid hormone replacement is needed for life, dose to be adjusted by periodic monitoring of thyroid function tests.   6) Chronic Care/Health Maintenance:  -Patient is on ACEI/ARB and Statin medications and encouraged to continue to follow up with Ophthalmology, Podiatrist at least yearly or according to recommendations, and advised to  stay away from smoking. I have recommended yearly flu vaccine and pneumonia vaccination at least every 5 years; moderate intensity exercise for up to 150  minutes weekly; and  sleep for at least 7 hours a day.  - I advised patient to maintain close follow up with [name], MD for primary care needs.  - Time spent on this patient care encounter:  35 min, of which > 50% was spent in  counseling and the rest reviewing his blood glucose logs , discussing his hypoglycemia and hyperglycemia episodes, reviewing his current and  previous labs / studies  ( including abstraction from other facilities) and medications  doses and developing a  long term treatment plan and documenting his care.   Please refer to Patient Instructions for Blood Glucose Monitoring and Insulin/Medications Dosing Guide"  in media tab for additional information. Please  also refer to " Patient Self Inventory" in the Media  tab for reviewed elements of pertinent patient history.  Paul Baker participated in the discussions, expressed understanding, and voiced agreement with the above plans.  All questions were answered to his satisfaction. he is encouraged to contact clinic should he have any questions or concerns prior to his return visit.    Follow up plan: -Return in about 4 weeks (around 06/06/2019) for Bring Meter and Logs- A1c in Office.  Glade Lloyd, MD Phone: [phone]  Fax: [phone]  This note was partially dictated with voice recognition software. Similar sounding words can be transcribed inadequately or may not  be corrected upon review.  05/09/2019, 9:18 AM

## 2019-05-19 NOTE — Patient Instructions (Addendum)
We will continue Requip and modafinil as prescribed. Please work on CPAP compliance and correcting leak. I will send an order for mask refitting and supplies.   Continue daily exercise. Focus on low glycemic diet.   Follow up in 3 months    Please continue using your CPAP regularly. While your insurance requires that you use CPAP at least 4 hours each night on 70% of the nights, I recommend, that you not skip any nights and use it throughout the night if you can. Getting used to CPAP and staying with the treatment long term does take time and patience and discipline. Untreated obstructive sleep apnea when it is moderate to severe can have an adverse impact on cardiovascular health and raise her risk for heart disease, arrhythmias, hypertension, congestive heart failure, stroke and diabetes. Untreated obstructive sleep apnea causes sleep disruption, nonrestorative sleep, and sleep deprivation. This can have an impact on your day to day functioning and cause daytime sleepiness and impairment of cognitive function, memory loss, mood disturbance, and problems focussing. Using CPAP regularly can improve these symptoms.   Stroke Prevention Some medical conditions and lifestyle choices can lead to a higher risk for a stroke. You can help to prevent a stroke by making nutrition, lifestyle, and other changes. What nutrition changes can be made?   Eat healthy foods. ? Choose foods that are high in fiber. These include:  Fresh fruits.  Fresh vegetables.  Whole grains. ? Eat at least 5 or more servings of fruits and vegetables each day. Try to fill half of your plate at each meal with fruits and vegetables. ? Choose lean protein foods. These include:  Lowfat (lean) cuts of meat.  Chicken without skin.  Fish.  Tofu.  Beans.  Nuts. ? Eat low-fat dairy products. ? Avoid foods that:  Are high in salt (sodium).  Have saturated fat.  Have trans fat.  Have cholesterol.  Are  processed.  Are premade.  Follow eating guidelines as told by your doctor. These may include: ? Reducing how many calories you eat and drink each day. ? Limiting how much salt you eat or drink each day to 1,500 milligrams (mg). ? Using only healthy fats for cooking. These include:  Olive oil.  Canola oil.  Sunflower oil. ? Counting how many carbohydrates you eat and drink each day. What lifestyle changes can be made?  Try to stay at a healthy weight. Talk to your doctor about what a good weight is for you.  Get at least 30 minutes of moderate physical activity at least 5 days a week. This can include: ? Fast walking. ? Biking. ? Swimming.  Do not use any products that have nicotine or tobacco. This includes cigarettes and e-cigarettes. If you need help quitting, ask your doctor. Avoid being around tobacco smoke in general.  Limit how much alcohol you drink to no more than 1 drink a day for nonpregnant women and 2 drinks a day for men. One drink equals 12 oz of beer, 5 oz of wine, or 1 oz of hard liquor.  Do not use drugs.  Avoid taking birth control pills. Talk to your doctor about the risks of taking birth control pills if: ? You are over 76 years old. ? You smoke. ? You get migraines. ? You have had a blood clot. What other changes can be made?  Manage your cholesterol. ? It is important to eat a healthy diet. ? If your cholesterol cannot be managed through your  diet, you may also need to take medicines. Take medicines as told by your doctor.  Manage your diabetes. ? It is important to eat a healthy diet and to exercise regularly. ? If your blood sugar cannot be managed through diet and exercise, you may need to take medicines. Take medicines as told by your doctor.  Control your high blood pressure (hypertension). ? Try to keep your blood pressure below 130/80. This can help lower your risk of stroke. ? It is important to eat a healthy diet and to exercise  regularly. ? If your blood pressure cannot be managed through diet and exercise, you may need to take medicines. Take medicines as told by your doctor. ? Ask your doctor if you should check your blood pressure at home. ? Have your blood pressure checked every year. Do this even if your blood pressure is normal.  Talk to your doctor about getting checked for a sleep disorder. Signs of this can include: ? Snoring a lot. ? Feeling very tired.  Take over-the-counter and prescription medicines only as told by your doctor. These may include aspirin or blood thinners (antiplatelets or anticoagulants).  Make sure that any other medical conditions you have are managed. Where to find more information  American Stroke Association: www.strokeassociation.org  National Stroke Association: www.stroke.org Get help right away if:  You have any symptoms of stroke. "BE FAST" is an easy way to remember the main warning signs: ? B - Balance. Signs are dizziness, sudden trouble walking, or loss of balance. ? E - Eyes. Signs are trouble seeing or a sudden change in how you see. ? F - Face. Signs are sudden weakness or loss of feeling of the face, or the face or eyelid drooping on one side. ? A - Arms. Signs are weakness or loss of feeling in an arm. This happens suddenly and usually on one side of the body. ? S - Speech. Signs are sudden trouble speaking, slurred speech, or trouble understanding what people say. ? T - Time. Time to call emergency services. Write down what time symptoms started.  You have other signs of stroke, such as: ? A sudden, very bad headache with no known cause. ? Feeling sick to your stomach (nausea). ? Throwing up (vomiting). ? Jerky movements you cannot control (seizure). These symptoms may represent a serious problem that is an emergency. Do not wait to see if the symptoms will go away. Get medical help right away. Call your local emergency services (911 in the [place] Do not  drive yourself to the hospital. Summary  You can prevent a stroke by eating healthy, exercising, not smoking, drinking less alcohol, and treating other health problems, such as diabetes, high blood pressure, or high cholesterol.  Do not use any products that contain nicotine or tobacco, such as cigarettes and e-cigarettes.  Get help right away if you have any signs or symptoms of a stroke. This information is not intended to replace advice given to you by your health care provider. Make sure you discuss any questions you have with your health care provider. Document Revised: 02/14/2018 Document Reviewed: 03/22/2016 Elsevier Patient Education  [place].    Restless Legs Syndrome Restless legs syndrome is a condition that causes uncomfortable feelings or sensations in the legs, especially while sitting or lying down. The sensations usually cause an overwhelming urge to move the legs. The arms can also sometimes be affected. The condition can range from mild to severe. The symptoms often interfere  with a person's ability to sleep. What are the causes? The cause of this condition is not known. What increases the risk? The following factors may make you more likely to develop this condition:  Being older than 50.  Pregnancy.  Being a woman. In general, the condition is more common in women than in men.  A family history of the condition.  Having iron deficiency.  Overuse of caffeine, nicotine, or alcohol.  Certain medical conditions, such as kidney disease, Parkinson's disease, or nerve damage.  Certain medicines, such as those for high blood pressure, nausea, colds, allergies, depression, and some heart conditions. What are the signs or symptoms? The main symptom of this condition is uncomfortable sensations in the legs, such as:  Pulling.  Tingling.  Prickling.  Throbbing.  Crawling.  Burning. Usually, the sensations:  Affect both sides of the body.  Are  worse when you sit or lie down.  Are worse at night. These may wake you up or make it difficult to fall asleep.  Make you have a strong urge to move your legs.  Are temporarily relieved by moving your legs. The arms can also be affected, but this is rare. People who have this condition often have tiredness during the day because of their lack of sleep at night. How is this diagnosed? This condition may be diagnosed based on:  Your symptoms.  Blood tests. In some cases, you may be monitored in a sleep lab by a specialist (a sleep study). This can detect any disruptions in your sleep. How is this treated? This condition is treated by managing the symptoms. This may include:  Lifestyle changes, such as exercising, using relaxation techniques, and avoiding caffeine, alcohol, or tobacco.  Medicines. Anti-seizure medicines may be tried first. Follow these instructions at home:     General instructions  Take over-the-counter and prescription medicines only as told by your health care provider.  Use methods to help relieve the uncomfortable sensations, such as: ? Massaging your legs. ? Walking or stretching. ? Taking a cold or hot bath.  Keep all follow-up visits as told by your health care provider. This is important. Lifestyle  Practice good sleep habits. For example, go to bed and get up at the same time every day. Most adults should get 7-9 hours of sleep each night.  Exercise regularly. Try to get at least 30 minutes of exercise most days of the week.  Practice ways of relaxing, such as yoga or meditation.  Avoid caffeine and alcohol.  Do not use any products that contain nicotine or tobacco, such as cigarettes and e-cigarettes. If you need help quitting, ask your health care provider. Contact a health care provider if:  Your symptoms get worse or they do not improve with treatment. Summary  Restless legs syndrome is a condition that causes uncomfortable feelings or  sensations in the legs, especially while sitting or lying down.  The symptoms often interfere with a person's ability to sleep.  This condition is treated by managing the symptoms. You may need to make lifestyle changes or take medicines. This information is not intended to replace advice given to you by your health care provider. Make sure you discuss any questions you have with your health care provider. Document Revised: 01/08/2017 Document Reviewed: 01/08/2017 Elsevier Patient Education  [place].   Sleep Apnea Sleep apnea affects breathing during sleep. It causes breathing to stop for a short time or to become shallow. It can also increase the risk  of:  Heart attack.  Stroke.  Being very overweight (obese).  Diabetes.  Heart failure.  Irregular heartbeat. The goal of treatment is to help you breathe normally again. What are the causes? There are three kinds of sleep apnea:  Obstructive sleep apnea. This is caused by a blocked or collapsed airway.  Central sleep apnea. This happens when the brain does not send the right signals to the muscles that control breathing.  Mixed sleep apnea. This is a combination of obstructive and central sleep apnea. The most common cause of this condition is a collapsed or blocked airway. This can happen if:  Your throat muscles are too relaxed.  Your tongue and tonsils are too large.  You are overweight.  Your airway is too small. What increases the risk?  Being overweight.  Smoking.  Having a small airway.  Being older.  Being male.  Drinking alcohol.  Taking medicines to calm yourself (sedatives or tranquilizers).  Having family members with the condition. What are the signs or symptoms?  Trouble staying asleep.  Being sleepy or tired during the day.  Getting angry a lot.  Loud snoring.  Headaches in the morning.  Not being able to focus your mind (concentrate).  Forgetting things.  Less  interest in sex.  Mood swings.  Personality changes.  Feelings of sadness (depression).  Waking up a lot during the night to pee (urinate).  Dry mouth.  Sore throat. How is this diagnosed?  Your medical history.  A physical exam.  A test that is done when you are sleeping (sleep study). The test is most often done in a sleep lab but may also be done at home. How is this treated?   Sleeping on your side.  Using a medicine to get rid of mucus in your nose (decongestant).  Avoiding the use of alcohol, medicines to help you relax, or certain pain medicines (narcotics).  Losing weight, if needed.  Changing your diet.  Not smoking.  Using a machine to open your airway while you sleep, such as: ? An oral appliance. This is a mouthpiece that shifts your lower jaw forward. ? A CPAP device. This device blows air through a mask when you breathe out (exhale). ? An EPAP device. This has valves that you put in each nostril. ? A BPAP device. This device blows air through a mask when you breathe in (inhale) and breathe out.  Having surgery if other treatments do not work. It is important to get treatment for sleep apnea. Without treatment, it can lead to:  High blood pressure.  Coronary artery disease.  In men, not being able to have an erection (impotence).  Reduced thinking ability. Follow these instructions at home: Lifestyle  Make changes that your doctor recommends.  Eat a healthy diet.  Lose weight if needed.  Avoid alcohol, medicines to help you relax, and some pain medicines.  Do not use any products that contain nicotine or tobacco, such as cigarettes, e-cigarettes, and chewing tobacco. If you need help quitting, ask your doctor. General instructions  Take over-the-counter and prescription medicines only as told by your doctor.  If you were given a machine to use while you sleep, use it only as told by your doctor.  If you are having surgery, make sure to  tell your doctor you have sleep apnea. You may need to bring your device with you.  Keep all follow-up visits as told by your doctor. This is important. Contact a doctor if:  The machine that you were given to use during sleep bothers you or does not seem to be working.  You do not get better.  You get worse. Get help right away if:  Your chest hurts.  You have trouble breathing in enough air.  You have an uncomfortable feeling in your back, arms, or stomach.  You have trouble talking.  One side of your body feels weak.  A part of your face is hanging down. These symptoms may be an emergency. Do not wait to see if the symptoms will go away. Get medical help right away. Call your local emergency services (911 in the [place] Do not drive yourself to the hospital. Summary  This condition affects breathing during sleep.  The most common cause is a collapsed or blocked airway.  The goal of treatment is to help you breathe normally while you sleep. This information is not intended to replace advice given to you by your health care provider. Make sure you discuss any questions you have with your health care provider. Document Revised: 10/05/2017 Document Reviewed: 08/14/2017 Elsevier Patient Education  [place].

## 2019-05-19 NOTE — Progress Notes (Signed)
PATIENT: Paul Baker DOB: November 06, 1950  REASON FOR VISIT: follow up HISTORY FROM: patient  Chief Complaint  Patient presents with  . OSA on CPAP, RLS    rm 1,  FU "my mask won't stay on, my machine cuts off; the Requip doesn't help half the time"     HISTORY OF PRESENT ILLNESS: Today 05/20/19 Paul Baker is a 69 y.o. male here today for follow up for OSA on CPAP, RLS and excessive daytime sleepiness. He continues Requip and modafinil. He feels that symptoms are fairly stable. He does continue to note daytime sleepiness and contributes this to multiple factors.  He continues to work on managing diabetes.  He has had multiple hospital visits due to hypo-/hyperglycemia.  He is using his CPAP most nights but continues to struggle with 4-hour compliance.  He continues to note a leak.   Compliance report dated 04/19/2019 through 05/18/2019 reveals that he used CPAP 24 of the past 30 days for compliance of 80%.  He used CPAP greater than 4 hours 14 of the past 30 days for compliance of 47%.  Average usage on days used was 4 hours and 25 minutes.  Residual AHI is 9.6 on 6 to 16 cm of water and an EPR of 3.  There was a significant leak noted in the 93rd percentile of 53.7.   HISTORY: (copied from [place] note on 06/22/2018)  Paul Baker is a 69 year old male with a history of obstructive sleep apnea on CPAP.  His CPAP download indicates that his machine 18 out of 30 days for compliance of 60%.  He is only uses machine greater than 4 hours 10 days for compliance of 33%.  On average he uses his machine 4 hours and 4 minutes.  His residual AHI is 8.7 on 6 to 16 cm of water with EPR 3.  He has a leak in the 95th percentile at 48.9 L/min.  He states that he has trouble using the mask as it continuously leaks.  He states he has a hard time with the straps staying on his head as well.  He recently received new supplies but he continues to leak.  The patient also states that his  restless legs has worsened.  He states that they are now occurring throughout the day.  Starting around 10 AM.  He changed the way he takes his medication.  He reports that he is taking the Requip extended release and cutting it in half and taking one half in the morning and one half in the evening.  He takes Requip extended release a half a tablet in the morning and another half before bedtime.  He states that this is not helping his symptoms.  He is up throughout the night due to restless legs.  He has tried gabapentin in the past but this caused a rash.  To his knowledge he is not been on any other medication.  He joins me today for virtual visit.  HISTORY 10/18/17 Paul Baker is a 69 year old male with a history of obstructive sleep apnea on CPAP.His CPAP download indicates that he uses machine 42 out of 90 days for compliance of 47%. He uses machine greater than 4 hours 29 days for compliance of 32%. On average he uses his machine 4 hours and 56 minutes. His residual AHI is 4.6 on 6 to 16 cm of water with EPR of 3. His leak in the 95th percentile is 30.4 L/min.He reports that he has a  hard time using the nasal pillows. He states that often he feels that he is suffocating or the mask comes off during the night. He also continues to have trouble with restless legs. He states that when we switched him to the extended release tablet did offer him benefit for several weeks but now in the last week he has been having more symptoms typically before bedtime and during the night. He returns today for evaluation.   REVIEW OF SYSTEMS: Out of a complete 14 system review of symptoms, the patient complains only of the following symptoms, chronic fatigue, restless legs and all other reviewed systems are negative.  ESS: 18 FSS: 16  ALLERGIES: Allergies  Allergen Reactions  . Tape Rash    Pulls off skin  . Cardizem [Diltiazem Hcl]     Edema   . Cardura [Doxazosin Mesylate]     Headaches /  cramps  . Lipitor [Atorvastatin] Other (See Comments)    Leg cramps  . Paxil [Paroxetine Hcl]     Unknown reaction   . Codeine Rash and Other (See Comments)    Headache   . Gabapentin Rash    HOME MEDICATIONS: Outpatient Medications Prior to Visit  Medication Sig Dispense Refill  . albuterol (VENTOLIN HFA) 108 (90 Base) MCG/ACT inhaler Inhale 2 puffs into the lungs every 6 (six) hours as needed for wheezing or shortness of breath. 18 g 5  . alfuzosin (UROXATRAL) 10 MG 24 hr tablet Take 10 mg by mouth daily with breakfast.    . amLODipine (NORVASC) 5 MG tablet Take 1 tablet (5 mg total) by mouth daily. 90 tablet 2  . B-D ULTRAFINE III SHORT PEN 31G X 8 MM MISC USING FIVE TIMES DAILY. 150 each 5  . clopidogrel (PLAVIX) 75 MG tablet Take 1 tablet (75 mg total) by mouth daily. 30 tablet 5  . Continuous Blood Gluc Receiver (DEXCOM G6 RECEIVER) DEVI 1 Piece by Does not apply route as needed. 1 Device 0  . Continuous Blood Gluc Sensor (DEXCOM G6 SENSOR) MISC 4 Pieces by Does not apply route once a week. 4 each 2  . EUTHYROX 137 MCG tablet TAKE 1 TABLET BY MOUTH ONCE DAILY BEFORE BREAKFAST 90 tablet 0  . fluticasone furoate-vilanterol (BREO ELLIPTA) 200-25 MCG/INH AEPB Inhale 1 puff into the lungs daily. 30 each 5  . furosemide (LASIX) 40 MG tablet Take 40 mg by mouth. Take 40 mg alternating with 80 mg Daily    . glipiZIDE (GLUCOTROL XL) 10 MG 24 hr tablet Take 1 tablet by mouth once daily with breakfast 90 tablet 0  . insulin regular human CONCENTRATED (HUMULIN R U-500 KWIKPEN) 500 UNIT/ML kwikpen Inject 50-70 Units into the skin 3 (three) times daily with meals. Per sliding scale. 6 pen 2  . Insulin Syringe-Needle U-100 (INSULIN SYRINGE 1CC/31GX5/16") 31G X 5/16" 1 ML MISC 1 each by Does not apply route 2 (two) times daily. 100 each 2  . isosorbide mononitrate (IMDUR) 60 MG 24 hr tablet Take 60 mg ( 1 Tablet)  in the AM and Take 30 mg ( 1/2 Tablet)  in the PM 135 tablet 3  . loratadine  (CLARITIN) 10 MG tablet Take 10 mg by mouth daily as needed for allergies.     [name] losartan (COZAAR) 50 MG tablet Take 1 tablet (50 mg total) by mouth daily. 90 tablet 0  . meclizine (ANTIVERT) 25 MG tablet Take 1 tablet (25 mg total) by mouth 3 (three) times daily as needed for dizziness or  nausea. 30 tablet 0  . metFORMIN (GLUCOPHAGE) 1000 MG tablet Take 0.5 tablets (500 mg total) by mouth 2 (two) times daily with a meal. 60 tablet 2  . metoprolol succinate (TOPROL-XL) 50 MG 24 hr tablet Take 1 tablet by mouth twice daily 180 tablet 3  . modafinil (PROVIGIL) 200 MG tablet TAKE ONE TABLET BY MOUTH ONE TIME DAILY  90 tablet 0  . nitroGLYCERIN (NITROSTAT) 0.4 MG SL tablet DISSOLVE 1 TABLET UNDER TONGUE EVERY 5 MINUTES UP TO 15 MIN FOR CHESTPAIN. IF NO RELIEF CALL 911. 25 tablet 3  . ONE TOUCH ULTRA TEST test strip TEST BLOOD SUGAR UP TO 4 TIMES DAILY. 150 each 5  . ONETOUCH DELICA LANCETS 99991111 MISC USE AS DIRECTED TO TEST BLOOD SUGAR 4 TIMES DAILY. 150 each 5  . pravastatin (PRAVACHOL) 40 MG tablet Take 1 tablet (40 mg total) by mouth every evening. 30 tablet 5  . rOPINIRole (REQUIP XL) 4 MG 24 hr tablet Take 1 tablet (4 mg total) by mouth at bedtime. 90 tablet 3  . rOPINIRole (REQUIP) 4 MG tablet Take 0.5 tablets (2 mg total) by mouth 2 (two) times daily. 90 tablet 3  . sodium chloride (OCEAN) 0.65 % SOLN nasal spray Place 1-2 sprays into both nostrils daily as needed for congestion.    . Vitamin D, Ergocalciferol, (DRISDOL) 1.25 MG (50000 UNIT) CAPS capsule Take 1 capsule by mouth once a week 12 capsule 0   No facility-administered medications prior to visit.    PAST MEDICAL HISTORY: Past Medical History:  Diagnosis Date  . Arthritis   . Asthma   . Colon polyp   . Coronary atherosclerosis of native coronary artery    a. 2011: cath showing 90% stenosis along small non-dominant RCA (too small for PCI). b. 01/2018: cath showing nonobstructive CAD with 60 to 70% proximal to mid nondominant RCA  stenosis, 50% mid LAD and 40 to 50% OM1  . DVT (deep venous thrombosis) ([place]) 2005   Right arm  . Essential hypertension, benign   . Headache   . History of transfusion   . Hypersomnia    CPAP of 16 cm, diagnosed with AHI of 60 in 2012,[place] 21- narcolepsy?  [name] Hypothyroidism   . Mixed hyperlipidemia   . Morbid obesity ([place])   . MRSA (methicillin resistant staph aureus) culture positive    08/2012  . Narcolepsy   . OSA (obstructive sleep apnea)    CPAP  . Osteoarthritis   . Pneumonia   . Psoriasis   . Pulmonary embolism ([place]) 2004  . RLS (restless legs syndrome)   . Rotator cuff disorder    Left  . Septic arthritis of knee, left ([place])   . Skin cancer, basal cell   . Type 2 diabetes mellitus ([place])     PAST SURGICAL HISTORY: Past Surgical History:  Procedure Laterality Date  . BACK SURGERY    . CATARACT EXTRACTION Bilateral   . COLONOSCOPY    . CYST REMOVAL TRUNK     from back  . EYE SURGERY Left 2016   laser to left eye  . HIP SURGERY     bone removed from both sides of hip  . KNEE ARTHROTOMY Right 12/04/2014   Procedure: KNEE ARTHROTOMY PATELLA LIGAMENT RECONSTRUSION AND REPAIR RIGHT KNEE;  Surgeon: Paralee Cancel, MD;  Location: [facility];  Service: Orthopedics;  Laterality: Right;  . KNEE SURGERY     X 25 TIMES  . LEFT HEART CATH AND CORONARY ANGIOGRAPHY N/A 01/17/2018  Procedure: LEFT HEART CATH AND CORONARY ANGIOGRAPHY;  Surgeon: Belva Crome, MD;  Location: [facility];  Service: Cardiovascular;  Laterality: N/A;  . LUMBAR DISC SURGERY     Left L3, L4, L5 discecotomy with decompression of L4 root  . TONSILLECTOMY    . TOTAL KNEE ARTHROPLASTY  2003   LEFT  . TOTAL KNEE ARTHROPLASTY Right 03/23/2014   Procedure: RIGHT TOTAL KNEE ARTHROPLASTY AND REMOVAL RIGHT TIBIAL  DEEP IMPLANT STAPLE;  Surgeon: Paralee Cancel, MD;  Location: [facility];  Service: Orthopedics;  Laterality: Right;  . TOTAL KNEE REVISION  2005   LEFT  . WRIST SURGERY      FAMILY  HISTORY: Family History  Problem Relation Age of Onset  . Hypertension Father   . Heart attack Father   . Kidney Stones Father   . Seizures Grandchild   . Narcolepsy Grandchild   . Diabetes Sister     SOCIAL HISTORY: Social History   Socioeconomic History  . Marital status: Married    Spouse name: Toney Reil  . Number of children: 2  . Years of education: college  . Highest education level: Not on file  Occupational History  . Occupation: Disabled    Employer: UNEMPLOYED  Tobacco Use  . Smoking status: Former Smoker    Packs/day: 1.50    Years: 10.00    Pack years: 15.00    Types: Cigarettes    Start date: 06/19/1967    Quit date: 01/03/1995    Years since quitting: 24.3  . Smokeless tobacco: Never Used  Substance and Sexual Activity  . Alcohol use: No    Alcohol/week: 0.0 standard drinks    Comment: quit drinking in 07/86  . Drug use: No  . Sexual activity: Yes    Partners: Female  Other Topics Concern  . Not on file  Social History Narrative    69 year old, right-handed, caucasian male with a past medical history of obesity, hypertension, hyperlipidemia, diabetes, obstructive sleep apnea, presenting with frequent nighttime awakenings, excessive daytime sleepiness, also transient confusional episodes.RLS and one beosity, OSA on CPAP with AHI of 3.2 and  setting of 16 cm water , [facility] .   Social Determinants of Health   Financial Resource Strain:   . Difficulty of Paying Living Expenses:   Food Insecurity:   . Worried About Charity fundraiser in the Last Year:   . Arboriculturist in the Last Year:   Transportation Needs:   . Film/video editor (Medical):   [name] Lack of Transportation (Non-Medical):   Physical Activity:   . Days of Exercise per Week:   . Minutes of Exercise per Session:   Stress:   . Feeling of Stress :   Social Connections:   . Frequency of Communication with Friends and Family:   . Frequency of Social Gatherings with Friends and  Family:   . Attends Religious Services:   . Active Member of Clubs or Organizations:   . Attends Archivist Meetings:   [name] Marital Status:   Intimate Partner Violence:   . Fear of Current or Ex-Partner:   . Emotionally Abused:   [name] Physically Abused:   . Sexually Abused:       PHYSICAL EXAM  Vitals:   05/20/19 0719  BP: 133/70  Pulse: 66  Weight: 227 lb 12.8 oz (103.3 kg)  Height: 5\' 5"  (1.651 m)   Body mass index is 37.91 kg/m.  Generalized: Well developed, in no acute distress  Cardiology: normal rate and rhythm, no murmur noted Respiratory: clear to auscultation bilaterally  Neurological examination  Mentation: Alert oriented to time, place, history taking. Follows all commands speech and language fluent Cranial nerve II-XII: Pupils were equal round reactive to light. Extraocular movements were full Motor: The motor testing reveals 5 over 5 strength of all 4 extremities. Good symmetric motor tone is noted throughout.   Gait and station: Gait is normal. .   DIAGNOSTIC DATA (LABS, IMAGING, TESTING) - I reviewed patient records, labs, notes, testing and imaging myself where available.  No flowsheet data found.   Lab Results  Component Value Date   WBC 6.8 04/27/2019   HGB 12.8 (L) 04/27/2019   HCT 39.3 04/27/2019   MCV 92.0 04/27/2019   PLT 243 04/27/2019      Component Value Date/Time   NA 133 (L) 04/27/2019 1528   NA 138 09/11/2018 0949   K 4.2 04/27/2019 1528   CL 96 (L) 04/27/2019 1528   CO2 25 04/27/2019 1528   GLUCOSE 474 (H) 04/27/2019 1528   BUN 36 (H) 04/27/2019 1528   BUN 22 09/11/2018 0949   CREATININE 1.48 (H) 04/27/2019 1528   CREATININE 1.11 02/06/2019 0813   CALCIUM 9.3 04/27/2019 1528   PROT 7.3 04/27/2019 1528   PROT 6.9 09/11/2018 0949   ALBUMIN 4.2 04/27/2019 1528   ALBUMIN 4.6 09/11/2018 0949   AST 18 04/27/2019 1528   ALT 24 04/27/2019 1528   ALKPHOS 74 04/27/2019 1528   BILITOT 0.5 04/27/2019 1528   BILITOT 0.3  09/11/2018 0949   GFRNONAA 48 (L) 04/27/2019 1528   GFRNONAA 68 02/06/2019 0813   GFRAA 56 (L) 04/27/2019 1528   GFRAA 79 02/06/2019 0813   Lab Results  Component Value Date   CHOL 201 (H) 02/19/2019   HDL 49 02/19/2019   LDLCALC 129 (H) 02/19/2019   TRIG 117 02/19/2019   CHOLHDL 4.1 02/19/2019   Lab Results  Component Value Date   HGBA1C 9.2 (H) 02/18/2019   No results found for: VITAMINB12 Lab Results  Component Value Date   TSH 1.43 02/06/2019       ASSESSMENT AND PLAN 69 y.o. year old male  has a past medical history of Arthritis, Asthma, Colon polyp, Coronary atherosclerosis of native coronary artery, DVT (deep venous thrombosis) ([place]) (2005), Essential hypertension, benign, Headache, History of transfusion, Hypersomnia, Hypothyroidism, Mixed hyperlipidemia, Morbid obesity ([place]), MRSA (methicillin resistant staph aureus) culture positive, Narcolepsy, OSA (obstructive sleep apnea), Osteoarthritis, Pneumonia, Psoriasis, Pulmonary embolism ([place]) (2004), RLS (restless legs syndrome), Rotator cuff disorder, Septic arthritis of knee, left ([place]), Skin cancer, basal cell, and Type 2 diabetes mellitus ([place]). here with     ICD-10-CM   1. OSA on CPAP  G47.33    Z99.89   2. RLS (restless legs syndrome)  G25.81   3. Excessive daytime sleepiness  G47.19     Mr. Zasada is doing fairly well today.  He continues to have excessive daytime sleepiness and some restless legs, however, he feels that medications do help.  We will continue modafinil 200 mg daily and Requip 4 mg daily.  We have had a lengthy discussion regarding his concerns of chronic fatigue.  I suspect that there are multiple factors contributing.  He was encouraged to continue working with primary care and endocrinology for better glucose control.  He was encouraged to continue exercising.  Low glycemic diet and adequate hydration encouraged.  Compliance report reveals optimal daily compliance, however, suboptimal 4-hour  compliance.  Residual  AHI remains elevated.  I have suggested that he be seen for a mask refitting with his DME.  I have educated him on how to monitor for a leak at home.  He was encouraged to work on improving compliance.  He will follow-up with me in 3 months for reevaluation.  He verbalizes understanding and agreement with this plan.   No orders of the defined types were placed in this encounter.    No orders of the defined types were placed in this encounter.     I spent 15 minutes with the patient. 50% of this time was spent counseling and educating patient on plan of care and medications.    Debbora Presto, FNP-C 05/20/2019, 7:54 AM [facility] [address] [place], [place] [address] [phone]

## 2019-05-20 ENCOUNTER — Ambulatory Visit (INDEPENDENT_AMBULATORY_CARE_PROVIDER_SITE_OTHER): Payer: Medicare HMO | Admitting: Family Medicine

## 2019-05-20 ENCOUNTER — Encounter: Payer: Self-pay | Admitting: Family Medicine

## 2019-05-20 ENCOUNTER — Other Ambulatory Visit: Payer: Self-pay

## 2019-05-20 VITALS — BP 133/70 | HR 66 | Ht 65.0 in | Wt 227.8 lb

## 2019-05-20 DIAGNOSIS — G4733 Obstructive sleep apnea (adult) (pediatric): Secondary | ICD-10-CM | POA: Diagnosis not present

## 2019-05-20 DIAGNOSIS — G4719 Other hypersomnia: Secondary | ICD-10-CM | POA: Diagnosis not present

## 2019-05-20 DIAGNOSIS — G2581 Restless legs syndrome: Secondary | ICD-10-CM

## 2019-05-20 DIAGNOSIS — Z9989 Dependence on other enabling machines and devices: Secondary | ICD-10-CM

## 2019-05-20 NOTE — Progress Notes (Signed)
Order for mask fitting and cpap supplies sent to [facility] via [employer]. Confirmation received that the order transmitted was successful.

## 2019-05-21 ENCOUNTER — Encounter: Payer: Self-pay | Admitting: Family Medicine

## 2019-05-30 DIAGNOSIS — M179 Osteoarthritis of knee, unspecified: Secondary | ICD-10-CM | POA: Diagnosis not present

## 2019-05-30 DIAGNOSIS — G4733 Obstructive sleep apnea (adult) (pediatric): Secondary | ICD-10-CM | POA: Diagnosis not present

## 2019-06-03 ENCOUNTER — Telehealth: Payer: Self-pay | Admitting: Family Medicine

## 2019-06-03 MED ORDER — CLOPIDOGREL BISULFATE 75 MG PO TABS: 75.0000 mg | ORAL_TABLET | Freq: Every day | ORAL | 2 refills | Status: DC

## 2019-06-03 NOTE — Addendum Note (Signed)
Addended by: Erven Colla on: 06/03/2019 05:04 PM   Modules accepted: Orders

## 2019-06-03 NOTE — Telephone Encounter (Signed)
[facility] requesting refill on Clopidogrel 75 mg. Take one tablet po daily. Pt last seen 02/25/19. Please advise. Thank you

## 2019-06-04 NOTE — Telephone Encounter (Signed)
Please contact patient to have him set up appointment in 2-3 months. Thank you!

## 2019-06-05 NOTE — Telephone Encounter (Signed)
Left message to schedule medication follow up with Dr.Taylor

## 2019-06-06 ENCOUNTER — Ambulatory Visit: Payer: Medicare HMO | Admitting: "Endocrinology

## 2019-06-09 NOTE — Telephone Encounter (Signed)
Left message again to schedule medication follow up

## 2019-06-10 ENCOUNTER — Telehealth: Payer: Self-pay | Admitting: Pulmonary Disease

## 2019-06-10 MED ORDER — PREDNISONE 10 MG PO TABS
ORAL_TABLET | ORAL | 0 refills | Status: DC
Start: 2019-06-10 — End: 2019-07-01

## 2019-06-10 NOTE — Telephone Encounter (Signed)
Spoke with pt. He is aware of Dr. Juanetta Gosling response and recommendations. Rx has been sent in for prednisone. Nothing further was needed.

## 2019-06-10 NOTE — Telephone Encounter (Signed)
Spoke with pt. States that he does not feel well. Reports increased sinus pressure, coughing and wheezing. Cough is non productive. Denies chest tightness, shortness of breath or fever. Symptoms started about 2-3 days ago. Pt has been using Delsym with no relief. He would like to have something sent to the pharmacy for him.  Dr. Halford Chessman - please advise. Thanks.

## 2019-06-10 NOTE — Telephone Encounter (Signed)
He should saline nasal spray daily, and flonase 1 spray in each nostril daily until his sinus pressure is better.  He can continue using claritin and breo.  Please send script for prednisone 10 mg pill >> 3 pills daily for 2 days, 2 pill daily for 2 days, 1 pill daily for days.  He should call to schedule an appointment if his symptoms fail to improve.

## 2019-06-16 ENCOUNTER — Ambulatory Visit: Payer: Medicare HMO | Admitting: "Endocrinology

## 2019-06-20 ENCOUNTER — Ambulatory Visit (INDEPENDENT_AMBULATORY_CARE_PROVIDER_SITE_OTHER): Payer: Medicare HMO | Admitting: Family Medicine

## 2019-06-20 DIAGNOSIS — E782 Mixed hyperlipidemia: Secondary | ICD-10-CM

## 2019-06-20 DIAGNOSIS — I25119 Atherosclerotic heart disease of native coronary artery with unspecified angina pectoris: Secondary | ICD-10-CM

## 2019-06-20 DIAGNOSIS — Z794 Long term (current) use of insulin: Secondary | ICD-10-CM

## 2019-06-20 DIAGNOSIS — I10 Essential (primary) hypertension: Secondary | ICD-10-CM

## 2019-06-20 DIAGNOSIS — E11649 Type 2 diabetes mellitus with hypoglycemia without coma: Secondary | ICD-10-CM

## 2019-06-20 NOTE — Progress Notes (Deleted)
no show

## 2019-06-22 ENCOUNTER — Telehealth: Payer: Self-pay | Admitting: Family Medicine

## 2019-06-23 NOTE — Telephone Encounter (Signed)
Please contact pt to schedule app. Thank you

## 2019-06-24 NOTE — Telephone Encounter (Signed)
Tried twice  to call patient no answer but left message hasnt not called back

## 2019-06-27 ENCOUNTER — Encounter: Payer: Self-pay | Admitting: Family Medicine

## 2019-06-29 ENCOUNTER — Other Ambulatory Visit: Payer: Self-pay | Admitting: Cardiology

## 2019-06-29 ENCOUNTER — Other Ambulatory Visit: Payer: Self-pay | Admitting: "Endocrinology

## 2019-06-29 ENCOUNTER — Other Ambulatory Visit: Payer: Self-pay | Admitting: Adult Health

## 2019-06-30 DIAGNOSIS — G4733 Obstructive sleep apnea (adult) (pediatric): Secondary | ICD-10-CM | POA: Diagnosis not present

## 2019-06-30 DIAGNOSIS — M179 Osteoarthritis of knee, unspecified: Secondary | ICD-10-CM | POA: Diagnosis not present

## 2019-07-01 ENCOUNTER — Ambulatory Visit: Payer: Medicare HMO | Admitting: Cardiology

## 2019-07-01 ENCOUNTER — Encounter: Payer: Self-pay | Admitting: Cardiology

## 2019-07-01 ENCOUNTER — Other Ambulatory Visit: Payer: Self-pay

## 2019-07-01 VITALS — BP 120/60 | HR 80 | Ht 65.0 in | Wt 220.0 lb

## 2019-07-01 DIAGNOSIS — I25119 Atherosclerotic heart disease of native coronary artery with unspecified angina pectoris: Secondary | ICD-10-CM

## 2019-07-01 DIAGNOSIS — E782 Mixed hyperlipidemia: Secondary | ICD-10-CM

## 2019-07-01 NOTE — Patient Instructions (Signed)
Medication Instructions:  Your physician recommends that you continue on your current medications as directed. Please refer to the Current Medication list given to you today.  *If you need a refill on your cardiac medications before your next appointment, please call your pharmacy*   Lab Work: None today If you have labs (blood work) drawn today and your tests are completely normal, you will receive your results only by: . MyChart Message (if you have MyChart) OR . A paper copy in the mail If you have any lab test that is abnormal or we need to change your treatment, we will call you to review the results.   Testing/Procedures: None today   Follow-Up: At [facility], you and your health needs are our priority.  As part of our continuing mission to provide you with exceptional heart care, we have created designated Provider Care Teams.  These Care Teams include your primary Cardiologist (physician) and Advanced Practice Providers (APPs -  Physician Assistants and Nurse Practitioners) who all work together to provide you with the care you need, when you need it.  We recommend signing up for the patient portal called "MyChart".  Sign up information is provided on this After Visit Summary.  MyChart is used to connect with patients for Virtual Visits (Telemedicine).  Patients are able to view lab/test results, encounter notes, upcoming appointments, etc.  Non-urgent messages can be sent to your provider as well.   To learn more about what you can do with MyChart, go to [email or web address].    Your next appointment:   12 month(s)  The format for your next appointment:   In Person  Provider:   Samuel McDowell, MD   Other Instructions None       Thank you for choosing [facility] !         

## 2019-07-01 NOTE — Progress Notes (Signed)
Cardiology Office Note  Date: 07/01/2019   ID: Paul Baker, DOB [birth year], MRN [id number]  PCP:  Erven Colla, DO  Cardiologist:  [name], MD Electrophysiologist:  None   Chief Complaint  Patient presents with  . Cardiac follow-up    History of Present Illness: Paul Baker is a 69 y.o. male last assessed via telehealth encounter in May 2020.  He presents today for a routine visit.  From a cardiac perspective he does not describe any progressive angina symptoms or increasing nitroglycerin use.  He remains chronically short of breath although has been improved since focused pulmonary follow-up now with Dr. Halford Chessman.  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  I reviewed his current medications which are stable from a cardiac perspective and outlined below.  He states he and his family had COVID-62 back in January.  I reviewed his ECG from February.   Past Medical History:  Diagnosis Date  . Arthritis   . Asthma   . Colon polyp   . Coronary atherosclerosis of native coronary artery    a. 2011: cath showing 90% stenosis along small non-dominant RCA (too small for PCI). b. 01/2018: cath showing nonobstructive CAD with 60 to 70% proximal to mid nondominant RCA stenosis, 50% mid LAD and 40 to 50% OM1  . DVT (deep venous thrombosis) ([place]) 2005   Right arm  . Essential hypertension, benign   . Headache   . History of transfusion   . Hypersomnia    CPAP of 16 cm, diagnosed with AHI of 60 in 2012,ep[place] 21- narcolepsy?  [name] Hypothyroidism   . Mixed hyperlipidemia   . Morbid obesity ([place])   . MRSA (methicillin resistant staph aureus) culture positive    08/2012  . Narcolepsy   . OSA (obstructive sleep apnea)    CPAP  . Osteoarthritis   . Pneumonia   . Psoriasis   . Pulmonary embolism ([place]) 2004  . RLS (restless legs syndrome)   . Rotator cuff disorder    Left  . Septic  arthritis of knee, left ([place])   . Skin cancer, basal cell   . Type 2 diabetes mellitus ([place])     Past Surgical History:  Procedure Laterality Date  . BACK SURGERY    . CATARACT EXTRACTION Bilateral   . COLONOSCOPY    . CYST REMOVAL TRUNK     from back  . EYE SURGERY Left 2016   laser to left eye  . HIP SURGERY     bone removed from both sides of hip  . KNEE ARTHROTOMY Right 12/04/2014   Procedure: KNEE ARTHROTOMY PATELLA LIGAMENT RECONSTRUSION AND REPAIR RIGHT KNEE;  Surgeon: Paralee Cancel, MD;  Location: [facility];  Service: Orthopedics;  Laterality: Right;  . KNEE SURGERY     X 25 TIMES  . LEFT HEART CATH AND CORONARY ANGIOGRAPHY N/A 01/17/2018   Procedure: LEFT HEART CATH AND CORONARY ANGIOGRAPHY;  Surgeon: Belva Crome, MD;  Location: [facility];  Service: Cardiovascular;  Laterality: N/A;  . LUMBAR DISC SURGERY     Left L3, L4, L5 discecotomy with decompression of L4 root  . TONSILLECTOMY    . TOTAL KNEE ARTHROPLASTY  2003   LEFT  . TOTAL KNEE ARTHROPLASTY Right 03/23/2014   Procedure: RIGHT TOTAL KNEE ARTHROPLASTY AND REMOVAL RIGHT TIBIAL  DEEP IMPLANT STAPLE;  Surgeon: Paralee Cancel, MD;  Location: [facility];  Service: Orthopedics;  Laterality: Right;  . TOTAL KNEE REVISION  2005   LEFT  . WRIST SURGERY      Current Outpatient Medications  Medication Sig Dispense Refill  . albuterol (VENTOLIN HFA) 108 (90 Base) MCG/ACT inhaler Inhale 2 puffs into the lungs every 6 (six) hours as needed for wheezing or shortness of breath. 18 g 5  . alfuzosin (UROXATRAL) 10 MG 24 hr tablet Take 10 mg by mouth daily with breakfast.    . amLODipine (NORVASC) 5 MG tablet TAKE 1 TABLET BY MOUTH ONCE DAILY **DOSE  INCREASE  05/21/2018** 90 tablet 3  . B-D ULTRAFINE III SHORT PEN 31G X 8 MM MISC USING FIVE TIMES DAILY. 150 each 5  . clopidogrel (PLAVIX) 75 MG tablet Take 1 tablet (75 mg total) by mouth daily. 30 tablet 2  . Continuous Blood Gluc Receiver (DEXCOM G6 RECEIVER) DEVI 1 Piece by Does  not apply route as needed. 1 Device 0  . Continuous Blood Gluc Sensor (DEXCOM G6 SENSOR) MISC 4 Pieces by Does not apply route once a week. 4 each 2  . fluticasone furoate-vilanterol (BREO ELLIPTA) 200-25 MCG/INH AEPB Inhale 1 puff into the lungs daily. 30 each 5  . furosemide (LASIX) 40 MG tablet Take 40 mg by mouth. Take 40 mg alternating with 80 mg Daily    . glipiZIDE (GLUCOTROL XL) 10 MG 24 hr tablet Take 1 tablet by mouth once daily with breakfast 90 tablet 0  . insulin regular human CONCENTRATED (HUMULIN R U-500 KWIKPEN) 500 UNIT/ML kwikpen Inject 50-70 Units into the skin 3 (three) times daily with meals. Per sliding scale. 6 pen 2  . isosorbide mononitrate (IMDUR) 60 MG 24 hr tablet TAKE 1 TABLET BY MOUTH IN THE MORNING AND 1/2 (ONE-HALF) IN THE EVENING 135 tablet 3  . loratadine (CLARITIN) 10 MG tablet Take 10 mg by mouth daily as needed for allergies.     [name] losartan (COZAAR) 50 MG tablet Take 1 tablet (50 mg total) by mouth daily. 90 tablet 0  . meclizine (ANTIVERT) 25 MG tablet Take 1 tablet (25 mg total) by mouth 3 (three) times daily as needed for dizziness or nausea. 30 tablet 0  . metFORMIN (GLUCOPHAGE) 1000 MG tablet Take 0.5 tablets (500 mg total) by mouth 2 (two) times daily with a meal. 60 tablet 2  . metFORMIN (GLUCOPHAGE) 500 MG tablet TAKE 1 TABLET BY MOUTH TWICE DAILY WITH MEALS 180 tablet 0  . metoprolol succinate (TOPROL-XL) 50 MG 24 hr tablet Take 1 tablet by mouth twice daily 180 tablet 3  . modafinil (PROVIGIL) 200 MG tablet TAKE ONE TABLET BY MOUTH ONE TIME DAILY 90 tablet 0  . nitroGLYCERIN (NITROSTAT) 0.4 MG SL tablet DISSOLVE 1 TABLET UNDER TONGUE EVERY 5 MINUTES UP TO 15 MIN FOR CHESTPAIN. IF NO RELIEF CALL 911. 25 tablet 3  . ONE TOUCH ULTRA TEST test strip TEST BLOOD SUGAR UP TO 4 TIMES DAILY. 150 each 5  . ONETOUCH DELICA LANCETS 74Q MISC USE AS DIRECTED TO TEST BLOOD SUGAR 4 TIMES DAILY. 150 each 5  . pravastatin (PRAVACHOL) 40 MG tablet Take 1 tablet (40 mg  total) by mouth every evening. 30 tablet 5  . rOPINIRole (REQUIP XL) 4 MG 24 hr tablet Take 1 tablet (4 mg total) by mouth at bedtime. 90 tablet 3  . rOPINIRole (REQUIP) 4 MG tablet Take 0.5 tablets (2 mg total) by mouth 2 (two) times daily. 90 tablet 3  . sodium chloride (OCEAN)  0.65 % SOLN nasal spray Place 1-2 sprays into both nostrils daily as needed for congestion.    . Vitamin D, Ergocalciferol, (DRISDOL) 1.25 MG (50000 UNIT) CAPS capsule Take 1 capsule by mouth once a week 12 capsule 0   No current facility-administered medications for this visit.   Allergies:  Tape, Cardizem [diltiazem hcl], Cardura [doxazosin mesylate], Lipitor [atorvastatin], Paxil [paroxetine hcl], Codeine, and Gabapentin   ROS:   No palpitations or syncope.  Physical Exam: VS:  BP 120/60   Pulse 80   Ht 5\' 5"  (1.651 m)   Wt 220 lb (99.8 kg)   SpO2 95%   BMI 36.61 kg/m , BMI Body mass index is 36.61 kg/m.  Wt Readings from Last 3 Encounters:  07/01/19 220 lb (99.8 kg)  05/20/19 227 lb 12.8 oz (103.3 kg)  05/09/19 227 lb 3.2 oz (103.1 kg)    General: Patient appears comfortable at rest. HEENT: Conjunctiva and lids normal, wearing a mask. Neck: Supple, no elevated JVP or carotid bruits, no thyromegaly. Lungs: Clear to auscultation, nonlabored breathing at rest. Cardiac: Regular rate and rhythm, no S3, 2/6 systolic murmur, no pericardial rub. Extremities: No pitting edema, distal pulses 2+.  ECG:  An ECG dated 02/18/2019 was personally reviewed today and demonstrated:  Sinus rhythm with leftward axis and borderline low voltage.  Recent Labwork: 09/11/2018: BNP 23.6 02/06/2019: TSH 1.43 04/27/2019: ALT 24; AST 18; BUN 36; Creatinine, Ser 1.48; Hemoglobin 12.8; Platelets 243; Potassium 4.2; Sodium 133     Component Value Date/Time   CHOL 201 (H) 02/19/2019 0519   CHOL 148 08/19/2015 0914   TRIG 117 02/19/2019 0519   HDL 49 02/19/2019 0519   HDL 53 08/19/2015 0914   CHOLHDL 4.1 02/19/2019 0519   VLDL 23  02/19/2019 0519   LDLCALC 129 (H) 02/19/2019 0519   LDLCALC 118 (H) 02/06/2019 0813    Other Studies Reviewed Today:  Cardiac catheterization 01/17/2018:  Nonobstructive coronary artery disease with 60 to 70% proximal to mid nondominant right coronary, 50% mid LAD, and 40 to 50% first obtuse marginal narrowing.  Normal left ventricular systolic function with EF 60%.  LVEDP is 17 mmHg.  Failed right heart catheterization from right antecubital vein due to distal subclavian stenosis likely from prior PICC line. Further attempts a right heart cath were aborted as IV heparin had been given for left heart catheterization.  RECOMMENDATIONS:   INOCA (ischemia with no obstructive coronary artery disease). Aggressive risk factor modification including excellent lipid control.  2D Doppler echocardiogram to assess RV and estimate PA pressure. Right heart cath from femoral approach her left arm could be attempted in the future if that information is absolutely needed.  He is not compliant with CPAP which may be the driver for his recent decrease in exertional tolerance and chest pain.  Echocardiogram 02/21/2018: 1. The left ventricle has normal systolic function, with an ejection fraction of 55-60%. The cavity size was normal. There is mildly increased left ventricular wall thickness. Left ventricular diastolic Doppler parameters are indeterminate Indeterminent filling pressures. 2. The right ventricle has normal systolic function. The cavity was normal. There is no increase in right ventricular wall thickness. 3. Left atrial size was mildly dilated. 4. The mitral valve is normal in structure. No evidence of mitral valve stenosis. 5. The tricuspid valve is normal in structure. 6. The aortic valve is tricuspid Mild thickening of the aortic valve no stenosis of the aortic valve. 7. The aortic root is normal in size and structure. 8.  Pulmonary hypertension is indeterminant,  inadequate TR jet. 9. The inferior vena cava was dilated in size with >50% respiratory variability.  Assessment and Plan:  1.  Medically managed CAD with 60 to 70% proximal to mid nondominant RCA stenosis documented by cardiac catheterization in January 2020.  He does not describe any progressive angina symptoms.  Continue Plavix, Norvasc, Imdur, Cozaar, Toprol-XL, and as needed nitroglycerin.  2.  Mixed hyperlipidemia, he remains on Pravachol at this point.  Goal LDL under 70.  Follow-up with lab work per PCP.  May need to consider a more potent statin or addition of Zetia although he has had previous intolerance to Lipitor.  3.  Severe asthma, now following with Dr. Halford Chessman in the Pulmonary clinic.  Medication Adjustments/Labs and Tests Ordered: Current medicines are reviewed at length with the patient today.  Concerns regarding medicines are outlined above.   Tests Ordered: No orders of the defined types were placed in this encounter.   Medication Changes: No orders of the defined types were placed in this encounter.   Disposition:  Follow up 1 year in the [place] office.  Signed, Satira Sark, MD, [facility] 07/01/2019 4:55 PM    [facility] at [facility] [address] [place], [place] [address] Phone: [phone]; Fax: [phone]

## 2019-07-18 ENCOUNTER — Other Ambulatory Visit: Payer: Self-pay

## 2019-07-18 ENCOUNTER — Telehealth: Payer: Self-pay | Admitting: "Endocrinology

## 2019-07-18 DIAGNOSIS — E1159 Type 2 diabetes mellitus with other circulatory complications: Secondary | ICD-10-CM

## 2019-07-18 DIAGNOSIS — E1110 Type 2 diabetes mellitus with ketoacidosis without coma: Secondary | ICD-10-CM | POA: Diagnosis not present

## 2019-07-18 MED ORDER — HUMULIN R U-500 KWIKPEN 500 UNIT/ML ~~LOC~~ SOPN
50.0000 [IU] | PEN_INJECTOR | Freq: Three times a day (TID) | SUBCUTANEOUS | 0 refills | Status: DC
Start: 2019-07-18 — End: 2019-08-01

## 2019-07-18 NOTE — Telephone Encounter (Signed)
Script printed and faxed to [facility].

## 2019-07-18 NOTE — Telephone Encounter (Signed)
Patient is calling and requesting a prescriptions called into [facility]. Humalin RU 500.   P: [phone] F:[phone]

## 2019-07-21 ENCOUNTER — Telehealth: Payer: Self-pay | Admitting: "Endocrinology

## 2019-07-21 ENCOUNTER — Other Ambulatory Visit: Payer: Self-pay | Admitting: *Deleted

## 2019-07-21 MED ORDER — LOSARTAN POTASSIUM 50 MG PO TABS: 50.0000 mg | ORAL_TABLET | Freq: Every day | ORAL | 0 refills | Status: DC

## 2019-07-21 NOTE — Telephone Encounter (Signed)
Responded to via fax request

## 2019-07-21 NOTE — Telephone Encounter (Signed)
[facility] needs a return call to clarify dosing on Humalin. [phone]

## 2019-07-28 ENCOUNTER — Other Ambulatory Visit: Payer: Self-pay | Admitting: "Endocrinology

## 2019-07-30 DIAGNOSIS — G4733 Obstructive sleep apnea (adult) (pediatric): Secondary | ICD-10-CM | POA: Diagnosis not present

## 2019-07-30 DIAGNOSIS — M179 Osteoarthritis of knee, unspecified: Secondary | ICD-10-CM | POA: Diagnosis not present

## 2019-08-01 ENCOUNTER — Other Ambulatory Visit: Payer: Self-pay

## 2019-08-01 ENCOUNTER — Other Ambulatory Visit: Payer: Self-pay | Admitting: Neurology

## 2019-08-01 ENCOUNTER — Encounter: Payer: Self-pay | Admitting: "Endocrinology

## 2019-08-01 ENCOUNTER — Ambulatory Visit: Payer: Medicare HMO | Admitting: "Endocrinology

## 2019-08-01 VITALS — BP 128/74 | HR 68 | Ht 65.0 in | Wt 214.0 lb

## 2019-08-01 DIAGNOSIS — I10 Essential (primary) hypertension: Secondary | ICD-10-CM | POA: Diagnosis not present

## 2019-08-01 DIAGNOSIS — E038 Other specified hypothyroidism: Secondary | ICD-10-CM | POA: Diagnosis not present

## 2019-08-01 DIAGNOSIS — E782 Mixed hyperlipidemia: Secondary | ICD-10-CM

## 2019-08-01 DIAGNOSIS — M7542 Impingement syndrome of left shoulder: Secondary | ICD-10-CM | POA: Diagnosis not present

## 2019-08-01 DIAGNOSIS — M25512 Pain in left shoulder: Secondary | ICD-10-CM | POA: Diagnosis not present

## 2019-08-01 DIAGNOSIS — E1159 Type 2 diabetes mellitus with other circulatory complications: Secondary | ICD-10-CM

## 2019-08-01 DIAGNOSIS — G2581 Restless legs syndrome: Secondary | ICD-10-CM

## 2019-08-01 LAB — POCT GLYCOSYLATED HEMOGLOBIN (HGB A1C): Hemoglobin A1C: 9.6 % — AB (ref 4.0–5.6)

## 2019-08-01 MED ORDER — HUMULIN R U-500 KWIKPEN 500 UNIT/ML ~~LOC~~ SOPN: 60.0000 [IU] | PEN_INJECTOR | Freq: Three times a day (TID) | SUBCUTANEOUS | 0 refills | Status: DC

## 2019-08-01 MED ORDER — GLIPIZIDE ER 5 MG PO TB24: 5.0000 mg | ORAL_TABLET | Freq: Every day | ORAL | 1 refills | Status: DC

## 2019-08-01 NOTE — Patient Instructions (Signed)

## 2019-08-01 NOTE — Progress Notes (Signed)
08/01/2019   Endocrinology follow-up note   Subjective:    Patient ID: Paul Baker, male    DOB: 1950/04/03, PCP Erven Colla, DO   Past Medical History:  Diagnosis Date  . Arthritis   . Asthma   . Colon polyp   . Coronary atherosclerosis of native coronary artery    a. 2011: cath showing 90% stenosis along small non-dominant RCA (too small for PCI). b. 01/2018: cath showing nonobstructive CAD with 60 to 70% proximal to mid nondominant RCA stenosis, 50% mid LAD and 40 to 50% OM1  . DVT (deep venous thrombosis) ([place]) 2005   Right arm  . Essential hypertension, benign   . Headache   . History of transfusion   . Hypersomnia    CPAP of 16 cm, diagnosed with AHI of 60 in 2012,[place] 21- narcolepsy?  [name] Hypothyroidism   . Mixed hyperlipidemia   . Morbid obesity ([place])   . MRSA (methicillin resistant staph aureus) culture positive    08/2012  . Narcolepsy   . OSA (obstructive sleep apnea)    CPAP  . Osteoarthritis   . Pneumonia   . Psoriasis   . Pulmonary embolism ([place]) 2004  . RLS (restless legs syndrome)   . Rotator cuff disorder    Left  . Septic arthritis of knee, left ([place])   . Skin cancer, basal cell   . Type 2 diabetes mellitus ([place])    Past Surgical History:  Procedure Laterality Date  . BACK SURGERY    . CATARACT EXTRACTION Bilateral   . COLONOSCOPY    . CYST REMOVAL TRUNK     from back  . EYE SURGERY Left 2016   laser to left eye  . HIP SURGERY     bone removed from both sides of hip  . KNEE ARTHROTOMY Right 12/04/2014   Procedure: KNEE ARTHROTOMY PATELLA LIGAMENT RECONSTRUSION AND REPAIR RIGHT KNEE;  Surgeon: Paralee Cancel, MD;  Location: [facility];  Service: Orthopedics;  Laterality: Right;  . KNEE SURGERY     X 25 TIMES  . LEFT HEART CATH AND CORONARY ANGIOGRAPHY N/A 01/17/2018   Procedure: LEFT HEART CATH AND CORONARY ANGIOGRAPHY;  Surgeon: Belva Crome, MD;  Location: [facility];  Service: Cardiovascular;  Laterality: N/A;  . LUMBAR DISC  SURGERY     Left L3, L4, L5 discecotomy with decompression of L4 root  . TONSILLECTOMY    . TOTAL KNEE ARTHROPLASTY  2003   LEFT  . TOTAL KNEE ARTHROPLASTY Right 03/23/2014   Procedure: RIGHT TOTAL KNEE ARTHROPLASTY AND REMOVAL RIGHT TIBIAL  DEEP IMPLANT STAPLE;  Surgeon: Paralee Cancel, MD;  Location: [facility];  Service: Orthopedics;  Laterality: Right;  . TOTAL KNEE REVISION  2005   LEFT  . WRIST SURGERY     Social History   Socioeconomic History  . Marital status: Married    Spouse name: Toney Reil  . Number of children: 2  . Years of education: college  . Highest education level: Not on file  Occupational History  . Occupation: Disabled    Employer: UNEMPLOYED  Tobacco Use  . Smoking status: Former Smoker    Packs/day: 1.50    Years: 10.00    Pack years: 15.00    Types: Cigarettes    Start date: 06/19/1967    Quit date: 01/03/1995    Years since quitting: 24.5  . Smokeless tobacco: Never Used  Vaping Use  . Vaping Use: Never used  Substance and Sexual Activity  .  Alcohol use: No    Alcohol/week: 0.0 standard drinks    Comment: quit drinking in 07/86  . Drug use: No  . Sexual activity: Yes    Partners: Female  Other Topics Concern  . Not on file  Social History Narrative    69 year old, right-handed, caucasian male with a past medical history of obesity, hypertension, hyperlipidemia, diabetes, obstructive sleep apnea, presenting with frequent nighttime awakenings, excessive daytime sleepiness, also transient confusional episodes.RLS and one beosity, OSA on CPAP with AHI of 3.2 and  setting of 16 cm water , [facility] .   Social Determinants of Health   Financial Resource Strain:   . Difficulty of Paying Living Expenses:   Food Insecurity:   . Worried About Charity fundraiser in the Last Year:   . Arboriculturist in the Last Year:   Transportation Needs:   . Film/video editor (Medical):   [name] Lack of Transportation (Non-Medical):   Physical Activity:   . Days of  Exercise per Week:   . Minutes of Exercise per Session:   Stress:   . Feeling of Stress :   Social Connections:   . Frequency of Communication with Friends and Family:   . Frequency of Social Gatherings with Friends and Family:   . Attends Religious Services:   . Active Member of Clubs or Organizations:   . Attends Archivist Meetings:   [name] Marital Status:    Outpatient Encounter Medications as of 08/01/2019  Medication Sig  . albuterol (VENTOLIN HFA) 108 (90 Base) MCG/ACT inhaler Inhale 2 puffs into the lungs every 6 (six) hours as needed for wheezing or shortness of breath.  . alfuzosin (UROXATRAL) 10 MG 24 hr tablet Take 10 mg by mouth daily with breakfast.  . amLODipine (NORVASC) 5 MG tablet TAKE 1 TABLET BY MOUTH ONCE DAILY **DOSE  INCREASE  05/21/2018**  . B-D ULTRAFINE III SHORT PEN 31G X 8 MM MISC USING FIVE TIMES DAILY.  [name] clopidogrel (PLAVIX) 75 MG tablet Take 1 tablet (75 mg total) by mouth daily.  . Continuous Blood Gluc Receiver (DEXCOM G6 RECEIVER) DEVI 1 Piece by Does not apply route as needed.  . Continuous Blood Gluc Sensor (DEXCOM G6 SENSOR) MISC 4 Pieces by Does not apply route once a week.  . fluticasone furoate-vilanterol (BREO ELLIPTA) 200-25 MCG/INH AEPB Inhale 1 puff into the lungs daily.  . furosemide (LASIX) 40 MG tablet Take 40 mg by mouth. Take 40 mg alternating with 80 mg Daily  . glipiZIDE (GLUCOTROL XL) 5 MG 24 hr tablet Take 1 tablet (5 mg total) by mouth daily with breakfast.  . insulin regular human CONCENTRATED (HUMULIN R U-500 KWIKPEN) 500 UNIT/ML kwikpen Inject 60-70 Units into the skin 3 (three) times daily with meals. Per sliding scale.  . isosorbide mononitrate (IMDUR) 60 MG 24 hr tablet TAKE 1 TABLET BY MOUTH IN THE MORNING AND 1/2 (ONE-HALF) IN THE EVENING  . loratadine (CLARITIN) 10 MG tablet Take 10 mg by mouth daily as needed for allergies.   [name] losartan (COZAAR) 50 MG tablet Take 1 tablet (50 mg total) by mouth daily.  . meclizine  (ANTIVERT) 25 MG tablet Take 1 tablet (25 mg total) by mouth 3 (three) times daily as needed for dizziness or nausea.  . metFORMIN (GLUCOPHAGE) 1000 MG tablet Take 0.5 tablets (500 mg total) by mouth 2 (two) times daily with a meal.  . metFORMIN (GLUCOPHAGE) 500 MG tablet TAKE 1 TABLET BY MOUTH TWICE DAILY  WITH MEALS  . metoprolol succinate (TOPROL-XL) 50 MG 24 hr tablet Take 1 tablet by mouth twice daily  . modafinil (PROVIGIL) 200 MG tablet TAKE ONE TABLET BY MOUTH ONE TIME DAILY  . nitroGLYCERIN (NITROSTAT) 0.4 MG SL tablet DISSOLVE 1 TABLET UNDER TONGUE EVERY 5 MINUTES UP TO 15 MIN FOR CHESTPAIN. IF NO RELIEF CALL 911.  [name] ONE TOUCH ULTRA TEST test strip TEST BLOOD SUGAR UP TO 4 TIMES DAILY.  [name] ONETOUCH DELICA LANCETS 41L MISC USE AS DIRECTED TO TEST BLOOD SUGAR 4 TIMES DAILY.  . pravastatin (PRAVACHOL) 40 MG tablet Take 1 tablet (40 mg total) by mouth every evening.  [name] rOPINIRole (REQUIP XL) 4 MG 24 hr tablet Take 1 tablet (4 mg total) by mouth at bedtime.  [name] rOPINIRole (REQUIP) 4 MG tablet Take 0.5 tablets (2 mg total) by mouth 2 (two) times daily.  . sodium chloride (OCEAN) 0.65 % SOLN nasal spray Place 1-2 sprays into both nostrils daily as needed for congestion.  . Vitamin D, Ergocalciferol, (DRISDOL) 1.25 MG (50000 UNIT) CAPS capsule Take 1 capsule by mouth once a week  . [DISCONTINUED] glipiZIDE (GLUCOTROL XL) 10 MG 24 hr tablet Take 1 tablet by mouth once daily with breakfast  . [DISCONTINUED] insulin regular human CONCENTRATED (HUMULIN R U-500 KWIKPEN) 500 UNIT/ML kwikpen Inject 50-70 Units into the skin 3 (three) times daily with meals. Per sliding scale.   No facility-administered encounter medications on file as of 08/01/2019.   ALLERGIES: Allergies  Allergen Reactions  . Tape Rash    Pulls off skin  . Cardizem [Diltiazem Hcl]     Edema   . Cardura [Doxazosin Mesylate]     Headaches / cramps  . Lipitor [Atorvastatin] Other (See Comments)    Leg cramps  . Paxil [Paroxetine  Hcl]     Unknown reaction   . Codeine Rash and Other (See Comments)    Headache   . Gabapentin Rash   VACCINATION STATUS: Immunization History  Administered Date(s) Administered  . Influenza Split 09/18/2012  . Influenza,inj,Quad PF,6+ Mos 10/13/2013, 10/06/2014, 09/11/2018  . Influenza-Unspecified 12/01/2015, 11/08/2016  . PFIZER SARS-COV-2 Vaccination 03/06/2019, 03/27/2019  . Pneumococcal Polysaccharide-23 10/02/2004, 10/03/2011  . Td 05/24/2006    Diabetes He presents for his follow-up diabetic visit. He has type 2 diabetes mellitus. Onset time: Was diagnosed at approximate age of 64 years. His disease course has been worsening. There are no hypoglycemic associated symptoms. Pertinent negatives for hypoglycemia include no confusion, headaches, pallor or seizures. Associated symptoms include polydipsia and polyuria. Pertinent negatives for diabetes include no chest pain, no fatigue, no polyphagia and no weakness. There are no hypoglycemic complications. Symptoms are worsening. Diabetic complications include heart disease. Risk factors for coronary artery disease include diabetes mellitus, dyslipidemia, male sex, obesity, sedentary lifestyle and tobacco exposure. Current diabetic treatment includes intensive insulin program. He is compliant with treatment most of the time. His weight is fluctuating minimally. He has had a previous visit with a dietitian. He never participates in exercise. His home blood glucose trend is increasing steadily. His breakfast blood glucose range is generally 140-180 mg/dl. His lunch blood glucose range is generally 180-200 mg/dl. His dinner blood glucose range is generally 180-200 mg/dl. His bedtime blood glucose range is generally 180-200 mg/dl. His overall blood glucose range is 140-180 mg/dl. (He presents with his logs and Dexcom printouts, showing 33% time range, 65% above range.  No major hypoglycemia.    His point-of-care A1c is 9.6%, increasing from 10 to  9.2%.   )  An ACE inhibitor/angiotensin II receptor blocker is being taken. He sees a podiatrist.Eye exam is current.  Hyperlipidemia This is a chronic problem. The current episode started more than 1 year ago. The problem is controlled. Exacerbating diseases include diabetes, hypothyroidism and obesity. Pertinent negatives include no chest pain, myalgias or shortness of breath. Current antihyperlipidemic treatment includes statins. Risk factors for coronary artery disease include dyslipidemia, diabetes mellitus, hypertension, male sex, obesity and a sedentary lifestyle.  Hypertension This is a chronic problem. The current episode started more than 1 year ago. The problem is controlled. Pertinent negatives include no chest pain, headaches, neck pain, palpitations or shortness of breath. Risk factors for coronary artery disease include diabetes mellitus, dyslipidemia, male gender, obesity and sedentary lifestyle. Past treatments include angiotensin blockers. Hypertensive end-organ damage includes CAD/MI. Identifiable causes of hypertension include a thyroid problem.  Thyroid Problem Presents for follow-up visit. Patient reports no constipation, diarrhea, fatigue or palpitations. The symptoms have been improving. Past treatments include levothyroxine. His past medical history is significant for diabetes and hyperlipidemia.     Review of systems  Constitutional: + gaining weight,  current  Body mass index is 35.61 kg/m. , no fatigue, no subjective hyperthermia, no subjective hypothermia Eyes: no blurry vision, no xerophthalmia ENT: no sore throat, no nodules palpated in throat, no dysphagia/odynophagia, no hoarseness Cardiovascular: no Chest Pain, no Shortness of Breath, no palpitations, no leg swelling Respiratory: no cough, no shortness of breath Gastrointestinal: no Nausea/Vomiting/Diarhhea Musculoskeletal:  + Chronic left shoulder pain due to rotator cuff.   Skin: no rashes, no  hyperemia Neurological: no tremors, no numbness, no tingling, no dizziness Psychiatric: no depression, no anxiety    Objective:    BP 128/74   Pulse 68   Ht 5\' 5"  (1.651 m)   Wt (!) 214 lb (97.1 kg)   BMI 35.61 kg/m   Wt Readings from Last 3 Encounters:  08/01/19 (!) 214 lb (97.1 kg)  07/01/19 220 lb (99.8 kg)  05/20/19 227 lb 12.8 oz (103.3 kg)       Physical Exam- Limited  Constitutional:  Body mass index is 35.61 kg/m. , not in acute distress, normal state of mind Eyes:  EOMI, no exophthalmos Neck: Supple Thyroid: No gross goiter Respiratory: Adequate breathing efforts Musculoskeletal: no gross deformities, strength intact in all four extremities, no gross restriction of joint movements Skin:  no rashes, no hyperemia Neurological: no tremor with outstretched hands   CMP Latest Ref Rng & Units 04/27/2019 02/18/2019 02/06/2019  Glucose 70 - 99 mg/dL 474(H) 344(H) 296(H)  BUN 8 - 23 mg/dL 36(H) 28(H) 30(H)  Creatinine 0.61 - 1.24 mg/dL 1.48(H) 1.06 1.11  Sodium 135 - 145 mmol/L 133(L) 137 137  Potassium 3.5 - 5.1 mmol/L 4.2 3.9 4.7  Chloride 98 - 111 mmol/L 96(L) 102 102  CO2 22 - 32 mmol/L 25 26 25   Calcium 8.9 - 10.3 mg/dL 9.3 9.1 9.8  Total Protein 6.5 - 8.1 g/dL 7.3 7.0 6.8  Total Bilirubin 0.3 - 1.2 mg/dL 0.5 0.7 0.3  Alkaline Phos 38 - 126 U/L 74 54 -  AST 15 - 41 U/L 18 19 12   ALT 0 - 44 U/L 24 21 15      Diabetic Labs (most recent): Lab Results  Component Value Date   HGBA1C 9.6 (A) 08/01/2019   HGBA1C 9.2 (H) 02/18/2019   HGBA1C 9.0 (A) 01/14/2019   Lipid Panel     Component Value Date/Time   CHOL 201 (H) 02/19/2019 0519   CHOL  148 08/19/2015 0914   TRIG 117 02/19/2019 0519   HDL 49 02/19/2019 0519   HDL 53 08/19/2015 0914   CHOLHDL 4.1 02/19/2019 0519   VLDL 23 02/19/2019 0519   LDLCALC 129 (H) 02/19/2019 0519   LDLCALC 118 (H) 02/06/2019 0813     Assessment & Plan:   1. Uncontrolled type 2 diabetes mellitus with circulatory  complication, with long-term current use of insulin (HCC)  -His diabetes is  complicated by coronary artery disease and patient remains at an extremely high risk for more acute and chronic complications of diabetes which include CAD, CVA, CKD, retinopathy, and neuropathy. These are all discussed in detail with the patient.  He presents with his logs and Dexcom printouts, showing 33% time range, 65% above range.  No major hypoglycemia.    His point-of-care A1c is 9.6%, increasing from 10 to 9.2%.    -He did not document no report of hypoglycemia.     Glucose logs and insulin administration records pertaining to this visit,  to be scanned into patient's records.  Recent labs reviewed.  - I have re-counseled the patient on diet management and weight loss  by adopting a carbohydrate restricted / protein rich  Diet.  - he  admits there is a room for improvement in his diet and drink choices. -  Suggestion is made for him to avoid simple carbohydrates  from his diet including Cakes, Sweet Desserts / Pastries, Ice Cream, Soda (diet and regular), Sweet Tea, Candies, Chips, Cookies, Sweet Pastries,  Store Bought Juices, Alcohol in Excess of  1-2 drinks a day, Artificial Sweeteners, Coffee Creamer, and "Sugar-free" Products. This will help patient to have stable blood glucose profile and potentially avoid unintended weight gain.   - Patient is advised to stick to a routine mealtimes to eat 3 meals  a day and avoid unnecessary snacks ( to snack only to correct hypoglycemia).   - I have approached patient with the following individualized plan to manage diabetes and patient agrees.  -He will continue to need intensive treatment with higher dose of insulin.  He has been better with U500 versus [place] and regular insulin.     -He is running postprandial hyperglycemia after breakfast, advised to increase his U50 0 to 70 units with breakfast, 7 units with lunch, and 60 units at supper  for Premeal blood  glucose greater than 90 mg per DL.  He will continue to use his CGM device, Dexcom at all times.    -Patient is encouraged to call clinic for blood glucose levels less than 70 or above 200 mg /dl x 3.   -He is advised to continue Metformin 500 mg p.o. twice daily.  -He is advised to lower his glipizide to 5 mg XL p.o. daily at breakfast.    2) BP/HTN:  His blood pressure is controlled to target.   He is advised to continue his current blood pressure medications including amlodipine, metoprolol, and losartan 50 mg p.o daily at breakfast.   3) Lipids/HPL: His recent labs show LDL increasing to 129 from 80.  He will continue to need intervention with statin, currently on pravastatin 40 mg p.o.  nightly.    4)  Weight/Diet: His BMI 36.8-complicating his diabetes care.  He is a candidate for modest weight loss.  CDE consult in progress, exercise, and carbohydrates information provided.  5) hypothyroidism -His recent thyroid function tests are consistent with appropriate replacement.  He is advised to continue  levothyroxine 137 mcg p.o. every  morning.     - We discussed about the correct intake of his thyroid hormone, on empty stomach at fasting, with water, separated by at least 30 minutes from breakfast and other medications,  and separated by more than 4 hours from calcium, iron, multivitamins, acid reflux medications (PPIs). -Patient is made aware of the fact that thyroid hormone replacement is needed for life, dose to be adjusted by periodic monitoring of thyroid function tests.   6) Chronic Care/Health Maintenance:  -Patient is on ACEI/ARB and Statin medications and encouraged to continue to follow up with Ophthalmology, Podiatrist at least yearly or according to recommendations, and advised to  stay away from smoking. I have recommended yearly flu vaccine and pneumonia vaccination at least every 5 years; moderate intensity exercise for up to 150 minutes weekly; and  sleep for at least 7  hours a day.  - I advised patient to maintain close follow up with Erven Colla, DO for primary care needs.  - Time spent on this patient care encounter:  35 min, of which > 50% was spent in  counseling and the rest reviewing his blood glucose logs , discussing his hypoglycemia and hyperglycemia episodes, reviewing his current and  previous labs / studies  ( including abstraction from other facilities) and medications  doses and developing a  long term treatment plan and documenting his care.   Please refer to Patient Instructions for Blood Glucose Monitoring and Insulin/Medications Dosing Guide"  in media tab for additional information. Please  also refer to " Patient Self Inventory" in the Media  tab for reviewed elements of pertinent patient history.  Paul Baker participated in the discussions, expressed understanding, and voiced agreement with the above plans.  All questions were answered to his satisfaction. he is encouraged to contact clinic should he have any questions or concerns prior to his return visit.    Follow up plan: -Return in about 4 months (around 12/02/2019) for F/U with Pre-visit Labs, Meter, Logs, A1c here., [place] with [facility].  Glade Lloyd, MD Phone: [phone]  Fax: [phone]  This note was partially dictated with voice recognition software. Similar sounding words can be transcribed inadequately or may not  be corrected upon review.  08/01/2019, 9:43 AM

## 2019-08-04 ENCOUNTER — Encounter: Payer: Self-pay | Admitting: Family Medicine

## 2019-08-04 ENCOUNTER — Emergency Department (HOSPITAL_COMMUNITY): Payer: Medicare HMO

## 2019-08-04 ENCOUNTER — Other Ambulatory Visit: Payer: Self-pay

## 2019-08-04 ENCOUNTER — Inpatient Hospital Stay (HOSPITAL_COMMUNITY)
Admission: EM | Admit: 2019-08-04 | Discharge: 2019-08-05 | DRG: 069 | Disposition: A | Discharge status: Home / Self Care | Payer: Medicare HMO | Attending: Internal Medicine | Admitting: Internal Medicine

## 2019-08-04 ENCOUNTER — Encounter (HOSPITAL_COMMUNITY): Payer: Self-pay | Admitting: Emergency Medicine

## 2019-08-04 ENCOUNTER — Ambulatory Visit (INDEPENDENT_AMBULATORY_CARE_PROVIDER_SITE_OTHER): Payer: Medicare HMO | Admitting: Family Medicine

## 2019-08-04 VITALS — BP 140/86 | Temp 98.0°F | Wt 214.4 lb

## 2019-08-04 DIAGNOSIS — E785 Hyperlipidemia, unspecified: Secondary | ICD-10-CM | POA: Diagnosis present

## 2019-08-04 DIAGNOSIS — Z888 Allergy status to other drugs, medicaments and biological substances status: Secondary | ICD-10-CM

## 2019-08-04 DIAGNOSIS — R0682 Tachypnea, not elsewhere classified: Secondary | ICD-10-CM | POA: Diagnosis present

## 2019-08-04 DIAGNOSIS — Z794 Long term (current) use of insulin: Secondary | ICD-10-CM

## 2019-08-04 DIAGNOSIS — R270 Ataxia, unspecified: Secondary | ICD-10-CM | POA: Diagnosis not present

## 2019-08-04 DIAGNOSIS — R2681 Unsteadiness on feet: Secondary | ICD-10-CM | POA: Diagnosis present

## 2019-08-04 DIAGNOSIS — H538 Other visual disturbances: Secondary | ICD-10-CM | POA: Diagnosis present

## 2019-08-04 DIAGNOSIS — J45909 Unspecified asthma, uncomplicated: Secondary | ICD-10-CM | POA: Diagnosis not present

## 2019-08-04 DIAGNOSIS — Z9842 Cataract extraction status, left eye: Secondary | ICD-10-CM

## 2019-08-04 DIAGNOSIS — R29818 Other symptoms and signs involving the nervous system: Secondary | ICD-10-CM | POA: Diagnosis not present

## 2019-08-04 DIAGNOSIS — Z79899 Other long term (current) drug therapy: Secondary | ICD-10-CM

## 2019-08-04 DIAGNOSIS — Z6835 Body mass index (BMI) 35.0-35.9, adult: Secondary | ICD-10-CM

## 2019-08-04 DIAGNOSIS — R0789 Other chest pain: Secondary | ICD-10-CM | POA: Diagnosis present

## 2019-08-04 DIAGNOSIS — Z86010 Personal history of colonic polyps: Secondary | ICD-10-CM | POA: Diagnosis not present

## 2019-08-04 DIAGNOSIS — Z86711 Personal history of pulmonary embolism: Secondary | ICD-10-CM

## 2019-08-04 DIAGNOSIS — Z86718 Personal history of other venous thrombosis and embolism: Secondary | ICD-10-CM

## 2019-08-04 DIAGNOSIS — E1165 Type 2 diabetes mellitus with hyperglycemia: Secondary | ICD-10-CM | POA: Diagnosis not present

## 2019-08-04 DIAGNOSIS — Z91048 Other nonmedicinal substance allergy status: Secondary | ICD-10-CM | POA: Diagnosis not present

## 2019-08-04 DIAGNOSIS — R202 Paresthesia of skin: Secondary | ICD-10-CM

## 2019-08-04 DIAGNOSIS — E782 Mixed hyperlipidemia: Secondary | ICD-10-CM | POA: Diagnosis present

## 2019-08-04 DIAGNOSIS — R200 Anesthesia of skin: Secondary | ICD-10-CM | POA: Diagnosis not present

## 2019-08-04 DIAGNOSIS — R42 Dizziness and giddiness: Secondary | ICD-10-CM | POA: Diagnosis not present

## 2019-08-04 DIAGNOSIS — E871 Hypo-osmolality and hyponatremia: Secondary | ICD-10-CM | POA: Diagnosis present

## 2019-08-04 DIAGNOSIS — Z8673 Personal history of transient ischemic attack (TIA), and cerebral infarction without residual deficits: Secondary | ICD-10-CM | POA: Diagnosis not present

## 2019-08-04 DIAGNOSIS — G8191 Hemiplegia, unspecified affecting right dominant side: Secondary | ICD-10-CM | POA: Diagnosis not present

## 2019-08-04 DIAGNOSIS — D649 Anemia, unspecified: Secondary | ICD-10-CM | POA: Diagnosis present

## 2019-08-04 DIAGNOSIS — G4733 Obstructive sleep apnea (adult) (pediatric): Secondary | ICD-10-CM | POA: Diagnosis not present

## 2019-08-04 DIAGNOSIS — Z9989 Dependence on other enabling machines and devices: Secondary | ICD-10-CM

## 2019-08-04 DIAGNOSIS — E039 Hypothyroidism, unspecified: Secondary | ICD-10-CM | POA: Diagnosis present

## 2019-08-04 DIAGNOSIS — R944 Abnormal results of kidney function studies: Secondary | ICD-10-CM | POA: Diagnosis present

## 2019-08-04 DIAGNOSIS — E1140 Type 2 diabetes mellitus with diabetic neuropathy, unspecified: Secondary | ICD-10-CM | POA: Diagnosis present

## 2019-08-04 DIAGNOSIS — G459 Transient cerebral ischemic attack, unspecified: Principal | ICD-10-CM | POA: Diagnosis present

## 2019-08-04 DIAGNOSIS — Z20822 Contact with and (suspected) exposure to covid-19: Secondary | ICD-10-CM | POA: Diagnosis not present

## 2019-08-04 DIAGNOSIS — G2581 Restless legs syndrome: Secondary | ICD-10-CM | POA: Diagnosis present

## 2019-08-04 DIAGNOSIS — Z833 Family history of diabetes mellitus: Secondary | ICD-10-CM

## 2019-08-04 DIAGNOSIS — Z85828 Personal history of other malignant neoplasm of skin: Secondary | ICD-10-CM

## 2019-08-04 DIAGNOSIS — G4700 Insomnia, unspecified: Secondary | ICD-10-CM | POA: Diagnosis present

## 2019-08-04 DIAGNOSIS — R29703 NIHSS score 3: Secondary | ICD-10-CM | POA: Diagnosis present

## 2019-08-04 DIAGNOSIS — R531 Weakness: Secondary | ICD-10-CM

## 2019-08-04 DIAGNOSIS — J4520 Mild intermittent asthma, uncomplicated: Secondary | ICD-10-CM | POA: Diagnosis not present

## 2019-08-04 DIAGNOSIS — R4182 Altered mental status, unspecified: Secondary | ICD-10-CM | POA: Diagnosis not present

## 2019-08-04 DIAGNOSIS — I10 Essential (primary) hypertension: Secondary | ICD-10-CM | POA: Diagnosis present

## 2019-08-04 DIAGNOSIS — R2990 Unspecified symptoms and signs involving the nervous system: Secondary | ICD-10-CM

## 2019-08-04 DIAGNOSIS — Z8614 Personal history of Methicillin resistant Staphylococcus aureus infection: Secondary | ICD-10-CM

## 2019-08-04 DIAGNOSIS — I639 Cerebral infarction, unspecified: Secondary | ICD-10-CM | POA: Diagnosis not present

## 2019-08-04 DIAGNOSIS — E669 Obesity, unspecified: Secondary | ICD-10-CM

## 2019-08-04 DIAGNOSIS — Z7902 Long term (current) use of antithrombotics/antiplatelets: Secondary | ICD-10-CM

## 2019-08-04 DIAGNOSIS — Z9841 Cataract extraction status, right eye: Secondary | ICD-10-CM

## 2019-08-04 DIAGNOSIS — Z885 Allergy status to narcotic agent status: Secondary | ICD-10-CM

## 2019-08-04 DIAGNOSIS — Z96653 Presence of artificial knee joint, bilateral: Secondary | ICD-10-CM | POA: Diagnosis present

## 2019-08-04 DIAGNOSIS — Z8249 Family history of ischemic heart disease and other diseases of the circulatory system: Secondary | ICD-10-CM

## 2019-08-04 DIAGNOSIS — R519 Headache, unspecified: Secondary | ICD-10-CM | POA: Diagnosis not present

## 2019-08-04 DIAGNOSIS — Z87891 Personal history of nicotine dependence: Secondary | ICD-10-CM

## 2019-08-04 DIAGNOSIS — I2510 Atherosclerotic heart disease of native coronary artery without angina pectoris: Secondary | ICD-10-CM | POA: Diagnosis present

## 2019-08-04 LAB — COMPREHENSIVE METABOLIC PANEL
ALT: 20 U/L (ref 0–44)
AST: 17 U/L (ref 15–41)
Albumin: 4.5 g/dL (ref 3.5–5.0)
Alkaline Phosphatase: 63 U/L (ref 38–126)
Anion gap: 7 (ref 5–15)
BUN: 26 mg/dL — ABNORMAL HIGH (ref 8–23)
CO2: 24 mmol/L (ref 22–32)
Calcium: 9.1 mg/dL (ref 8.9–10.3)
Chloride: 101 mmol/L (ref 98–111)
Creatinine, Ser: 1.03 mg/dL (ref 0.61–1.24)
GFR calc Af Amer: >60 mL/min (ref >60)
GFR calc non Af Amer: >60 mL/min (ref >60)
Glucose, Bld: 302 mg/dL — ABNORMAL HIGH (ref 70–99)
Potassium: 3.8 mmol/L (ref 3.5–5.1)
Sodium: 132 mmol/L — ABNORMAL LOW (ref 135–145)
Total Bilirubin: 0.2 mg/dL — ABNORMAL LOW (ref 0.3–1.2)
Total Protein: 7.6 g/dL (ref 6.5–8.1)

## 2019-08-04 LAB — URINALYSIS, ROUTINE W REFLEX MICROSCOPIC
Bacteria, UA: NONE SEEN
Bilirubin Urine: NEGATIVE
Glucose, UA: >=500 mg/dL — AB
Hgb urine dipstick: NEGATIVE
Ketones, ur: NEGATIVE mg/dL
Leukocytes,Ua: NEGATIVE
Nitrite: NEGATIVE
Protein, ur: 30 mg/dL — AB
Specific Gravity, Urine: 1.008 (ref 1.005–1.030)
pH: 6 (ref 5.0–8.0)

## 2019-08-04 LAB — CBC
HCT: 40.3 % (ref 39.0–52.0)
Hemoglobin: 12.9 g/dL — ABNORMAL LOW (ref 13.0–17.0)
MCH: 29.6 pg (ref 26.0–34.0)
MCHC: 32 g/dL (ref 30.0–36.0)
MCV: 92.4 fL (ref 80.0–100.0)
Platelets: 275 10*3/uL (ref 150–400)
RBC: 4.36 MIL/uL (ref 4.22–5.81)
RDW: 13.8 % (ref 11.5–15.5)
WBC: 9.6 10*3/uL (ref 4.0–10.5)
nRBC: 0 % (ref 0.0–0.2)

## 2019-08-04 LAB — RAPID URINE DRUG SCREEN, HOSP PERFORMED
Amphetamines: NOT DETECTED
Barbiturates: NOT DETECTED
Benzodiazepines: NOT DETECTED
Cocaine: NOT DETECTED
Opiates: NOT DETECTED
Tetrahydrocannabinol: NOT DETECTED

## 2019-08-04 LAB — SARS CORONAVIRUS 2 BY RT PCR (HOSPITAL ORDER, PERFORMED IN ~~LOC~~ HOSPITAL LAB): SARS Coronavirus 2: NEGATIVE

## 2019-08-04 LAB — TROPONIN I (HIGH SENSITIVITY): Troponin I (High Sensitivity): 5 ng/L (ref <18)

## 2019-08-04 LAB — ETHANOL: Alcohol, Ethyl (B): <10 mg/dL (ref <10)

## 2019-08-04 LAB — CBG MONITORING, ED: Glucose-Capillary: 277 mg/dL — ABNORMAL HIGH (ref 70–99)

## 2019-08-04 MED ORDER — MECLIZINE HCL 12.5 MG PO TABS
25.0000 mg | ORAL_TABLET | Freq: Once | ORAL | Status: AC
  Administered 2019-08-04: 25 mg via ORAL
  Filled 2019-08-04: qty 2

## 2019-08-04 NOTE — Progress Notes (Signed)
   Subjective:    Patient ID: Paul Baker, male    DOB: 1950-10-02, 69 y.o.   MRN: [id number]  HPI Patient comes in today with complaints of headache and dizziness since yesterday.   Onset of intermittent dizziness also mild headache not feeling good states energy level low Patient received cortisone shot last Friday.  Sugar now is is 316. Patient uncertain if he is having one-sided weakness States he has had a history of a small stroke  Review of Systems See above    Objective:   Physical Exam Patient has moderate ataxia EOMI Romberg positive Lungs clear Heart regular Pulse normal Extremities trace edema Patient with moderate ataxia walking in the hallways       Assessment & Plan:  With ataxia as well as feeling off balance no true interfere Has risk factors for stroke Needs acute work-up in the ED This was discussed with the patient and with ER doctor They will see him today We will provide follow-up if all goes well for the patient

## 2019-08-04 NOTE — ED Triage Notes (Signed)
Pt reports headache x2 days, intermittent blurred vision dizziness since yesterday. Pt reports decreased sensation on right. Pt ambulates from wheelchair to stretcher with steady gait. Speech clear. Facial symmetry noted. Pt sent over by pcp.

## 2019-08-04 NOTE — Consult Note (Signed)
[facility] Tele[facility]urology Consult Services  Stat Consult  Date of Service:   08/04/2019 20:12:11  Impression:     .  R42 - Dizziness/ Vertigo/ Giddiness     .  R20.2 - Paresthesia of skin  Comments/Sign-Out: 69 year old male with multiple vascular risk factors presented to the emergency department with numbness on the right side as well as gait instability. On exam, he does appear to have some right lower extremity drift and ataxia, as well as right hand numbness when compared to the left. All the symptoms appear to be new for him, normally he feels like his legs are strong. His brain MRI in the emergency department showed no evidence of acute ischemia. However, due to his persistent symptoms, I am concerned for diffusion negative ischemic stroke. Would recommend admission and especially therapy evaluations. Neurology follow-up during this hospital stay. If he continues to have symptoms, could consider a repeat MRI in 24 to 48 hours with an MR angiogram head and neck as well Continue Plavix.  Metrics: [facility] Notification Time: 08/04/2019 20:11:04 Stamp Time: 08/04/2019 20:12:11 Callback Response Time: 08/04/2019 20:17:41  Our recommendations are outlined below.  Recommendations:     .  Admission for observation     .  Physical and Occupational Therapy evaluations     .  Neurology follow-up     .  Consider repeat MRI brain and MR angiogram head and neck at 24 to 48 hours to look for evidence of evolving ischemia     .  Cardiac evaluation, including continuous telemetry, transthoracic echocardiogram   Other WorkUp:     .  Infectious/metabolic workup per primary team     .  Check CMP     .  Check TSH     .  Check Urinalysis  Disposition: Neurology Follow Up Recommended  Sign Out:     .  Discussed with Emergency Department Provider  ----------------------------------------------------------------------------------------------------  Chief  Complaint: Dizziness, numbness  History of Present Illness: Patient is a 69 year old Male.  This is a 69 year old male with a history of hypertension, diabetes, coronary artery disease, OSA on CPAP, obesity, diabetic diabetic neuropathy, history of spinal stenosis in the cervical thoracic region that has been documented in his chart. He reports that yesterday, he began having symptoms of dizziness and right arm numbness of fluctuated. In terms of the dizziness, he states that it feels like he is "imbalance" as well as "wobbly", however no clear vertigo. He feels like he continues to have numbness of the right hand as well which is all new for him. He reports that he had similar symptoms about 6 years ago and was told that he had a "small stroke". He is currently on Plavix. He did have an MRI of his brain while in the emergency department which showed no evidence of acute ischemia. However, he continues to be symptomatic. In terms of the dizziness, he does feel like the symptoms get worse when he stands up and does feel slightly nauseous when he stands up. No other clear focal neurologic symptoms. On exam though, he does appear to have some drift of the right leg as well as some ataxia of the right leg, which she reports is new for him. He also reports that his right arm does feel more numb than the left, again which is new for him as well.   Past Medical History:     . Hypertension     . Diabetes Mellitus   Antiplatelet use: plavix  Examination: BP(142/83), Pulse(70), Blood Glucose(302) 1A: Level of Consciousness - Alert; keenly responsive + 0 1B: Ask Month and Age - Both Questions Right + 0 1C: Blink Eyes & Squeeze Hands - Performs Both Tasks + 0 2: Test Horizontal Extraocular Movements - Normal + 0 3: Test Visual Fields - No Visual Loss + 0 4: Test Facial Palsy (Use Grimace if Obtunded) - Normal symmetry + 0 5A: Test Left Arm Motor Drift - No Drift for 10 Seconds + 0 5B: Test Right Arm  Motor Drift - No Drift for 10 Seconds + 0 6A: Test Left Leg Motor Drift - No Drift for 5 Seconds + 0 6B: Test Right Leg Motor Drift - Drift, but doesn't hit bed + 1 7: Test Limb Ataxia (FNF/Heel-Shin) - Ataxia in 1 Limb + 1 8: Test Sensation - Mild-Moderate Loss: Less Sharp/More Dull + 1 9: Test Language/Aphasia - Normal; No aphasia + 0 10: Test Dysarthria - Normal + 0 11: Test Extinction/Inattention - No abnormality + 0  NIHSS Score: 3    Patient is being evaluated for possible acute neurologic impairment and high probability of imminent or life-threatening deterioration. I spent total of 35 minutes providing care to this patient, including time for face to face visit via telemedicine, review of medical records, imaging studies and discussion of findings with providers, the patient and/or family.   Dr Knox Royalty   [facility] [phone]  Case [id number]

## 2019-08-04 NOTE — H&P (Signed)
.  History and Physical  Paul Baker RJJ:[id number] DOB: August 10, 1950 DOA: 08/04/2019  Referring physician: Lucrezia Starch, MD PCP: Paul Colla, DO  Outpatient Specialists: [facility] Paul Gell, MD) Patient coming from: Home  Chief Complaint: Dizziness  HPI: Paul Baker is a 69 y.o. male with medical history significant for coronary artery disease, hypertension, diabetes mellitus, restless leg syndrome and DVT who presented to the emergency department due to 1 day onset of dizziness.  He complained of intermittent dizziness with slight blurred vision yesterday, on waking up this morning, patient complained of right arm and right leg numbness and unsteady gait requiring holding onto things to help avoid falling.  Patient denies room spinning, but described the dizziness as unsteadiness, denies slurred speech.  He endorsed being admitted to this facility with similar episode in February of this year, during which he was told that he had an old stroke.  Patient denies chest pain, shortness of breath, headache, nausea, vomiting, abdominal pain or diarrhea.  ED Course: In the emergency department, he was intermittently mildly tachypneic, otherwise he was hemodynamically stable.  Work-up in the ED showed normocytic anemia, hyponatremia, elevated BUN at 26 and blood glucose of 302.  MRI head without contrast showed no acute infarct but showed mild chronic ischemic changes on the left.  Urine drug screen was negative, urine analysis was negative for UTI, alcohol level < 10, troponin x1 was negative.  Review of Systems: Constitutional: Negative for chills and fever.  HENT: Negative for ear pain and sore throat.   Eyes: Negative for pain and visual disturbance.  Respiratory: Negative for cough, chest tightness and shortness of breath.   Cardiovascular: Negative for chest pain and palpitations.  Gastrointestinal: Negative for abdominal pain and vomiting.  Endocrine: Negative  for polyphagia and polyuria.  Genitourinary: Negative for decreased urine volume, dysuria Musculoskeletal: Negative for arthralgias and back pain.  Skin: Negative for color change and rash.  Allergic/Immunologic: Negative for immunocompromised state.  Neurological: Positive for dizziness right-sided numbness.  Negative for tremors, syncope, speech difficulty Hematological: Does not bruise/bleed easily.  All other systems reviewed and are negative   Past Medical History:  Diagnosis Date  . Arthritis   . Asthma   . Colon polyp   . Coronary atherosclerosis of native coronary artery    a. 2011: cath showing 90% stenosis along small non-dominant RCA (too small for PCI). b. 01/2018: cath showing nonobstructive CAD with 60 to 70% proximal to mid nondominant RCA stenosis, 50% mid LAD and 40 to 50% OM1  . DVT (deep venous thrombosis) ([place]) 2005   Right arm  . Essential hypertension, benign   . Headache   . History of transfusion   . Hypersomnia    CPAP of 16 cm, diagnosed with AHI of 60 in 2012,[place] 21- narcolepsy?  [name] Hypothyroidism   . Mixed hyperlipidemia   . Morbid obesity ([place])   . MRSA (methicillin resistant staph aureus) culture positive    08/2012  . Narcolepsy   . OSA (obstructive sleep apnea)    CPAP  . Osteoarthritis   . Pneumonia   . Psoriasis   . Pulmonary embolism ([place]) 2004  . RLS (restless legs syndrome)   . Rotator cuff disorder    Left  . Septic arthritis of knee, left ([place])   . Skin cancer, basal cell   . Type 2 diabetes mellitus ([place])    Past Surgical History:  Procedure Laterality Date  . BACK SURGERY    .  CATARACT EXTRACTION Bilateral   . COLONOSCOPY    . CYST REMOVAL TRUNK     from back  . EYE SURGERY Left 2016   laser to left eye  . HIP SURGERY     bone removed from both sides of hip  . KNEE ARTHROTOMY Right 12/04/2014   Procedure: KNEE ARTHROTOMY PATELLA LIGAMENT RECONSTRUSION AND REPAIR RIGHT KNEE;  Surgeon: Paralee Cancel, MD;  Location: [facility];   Service: Orthopedics;  Laterality: Right;  . KNEE SURGERY     X 25 TIMES  . LEFT HEART CATH AND CORONARY ANGIOGRAPHY N/A 01/17/2018   Procedure: LEFT HEART CATH AND CORONARY ANGIOGRAPHY;  Surgeon: Belva Crome, MD;  Location: [facility];  Service: Cardiovascular;  Laterality: N/A;  . LUMBAR DISC SURGERY     Left L3, L4, L5 discecotomy with decompression of L4 root  . TONSILLECTOMY    . TOTAL KNEE ARTHROPLASTY  2003   LEFT  . TOTAL KNEE ARTHROPLASTY Right 03/23/2014   Procedure: RIGHT TOTAL KNEE ARTHROPLASTY AND REMOVAL RIGHT TIBIAL  DEEP IMPLANT STAPLE;  Surgeon: Paralee Cancel, MD;  Location: [facility];  Service: Orthopedics;  Laterality: Right;  . TOTAL KNEE REVISION  2005   LEFT  . WRIST SURGERY      Social History:  reports that he quit smoking about 24 years ago. His smoking use included cigarettes. He started smoking about 52 years ago. He has a 15.00 pack-year smoking history. He has never used smokeless tobacco. He reports that he does not drink alcohol and does not use drugs.   Allergies  Allergen Reactions  . Tape Rash    Pulls off skin  . Cardizem [Diltiazem Hcl]     Edema   . Cardura [Doxazosin Mesylate]     Headaches / cramps  . Lipitor [Atorvastatin] Other (See Comments)    Leg cramps  . Paxil [Paroxetine Hcl]     Unknown reaction   . Codeine Rash and Other (See Comments)    Headache   . Gabapentin Rash    Family History  Problem Relation Age of Onset  . Hypertension Father   . Heart attack Father   . Kidney Stones Father   . Seizures Grandchild   . Narcolepsy Grandchild   . Diabetes Sister      Prior to Admission medications   Medication Sig Start Date End Date Taking? Authorizing Provider  albuterol (VENTOLIN HFA) 108 (90 Base) MCG/ACT inhaler Inhale 2 puffs into the lungs every 6 (six) hours as needed for wheezing or shortness of breath. 12/12/18  Yes [name], MD  alfuzosin (UROXATRAL) 10 MG 24 hr tablet Take 10 mg by mouth daily with  breakfast.   Yes [provider]  amLODipine (NORVASC) 5 MG tablet TAKE 1 TABLET BY MOUTH ONCE DAILY **DOSE  INCREASE  05/21/2018** Patient taking differently: Take 5 mg by mouth daily.  06/30/19  Yes Satira Sark, MD  B-D ULTRAFINE III SHORT PEN 31G X 8 MM MISC USING FIVE TIMES DAILY. 11/06/16  Yes Cassandria Anger, MD  clopidogrel (PLAVIX) 75 MG tablet Take 1 tablet (75 mg total) by mouth daily. 06/03/19  Yes Lovena Le, Malena M, DO  Continuous Blood Gluc Receiver (DEXCOM G6 RECEIVER) DEVI 1 Piece by Does not apply route as needed. 04/13/18  Yes Nida, Marella Chimes, MD  Continuous Blood Gluc Sensor (DEXCOM G6 SENSOR) MISC 4 Pieces by Does not apply route once a week. 05/15/18  Yes Nida, Marella Chimes, MD  fluticasone  furoate-vilanterol (BREO ELLIPTA) 200-25 MCG/INH AEPB Inhale 1 puff into the lungs daily. 01/13/19  Yes Chesley Mires, MD  furosemide (LASIX) 40 MG tablet Take 40 mg by mouth. Take 40 mg alternating with 80 mg Daily   Yes [provider]  glipiZIDE (GLUCOTROL XL) 5 MG 24 hr tablet Take 1 tablet (5 mg total) by mouth daily with breakfast. 08/01/19  Yes Nida, Marella Chimes, MD  insulin regular human CONCENTRATED (HUMULIN R U-500 KWIKPEN) 500 UNIT/ML kwikpen Inject 60-70 Units into the skin 3 (three) times daily with meals. Per sliding scale. 08/01/19  Yes Nida, Marella Chimes, MD  isosorbide mononitrate (IMDUR) 60 MG 24 hr tablet TAKE 1 TABLET BY MOUTH IN THE MORNING AND 1/2 (ONE-HALF) IN THE EVENING 06/30/19  Yes Satira Sark, MD  loratadine (CLARITIN) 10 MG tablet Take 10 mg by mouth daily as needed for allergies.    Yes [provider]  losartan (COZAAR) 50 MG tablet Take 1 tablet (50 mg total) by mouth daily. 07/21/19  Yes Lovena Le, Malena M, DO  metFORMIN (GLUCOPHAGE) 500 MG tablet TAKE 1 TABLET BY MOUTH TWICE DAILY WITH MEALS 06/30/19  Yes Nida, Marella Chimes, MD  metoprolol succinate (TOPROL-XL) 50 MG 24 hr tablet Take 1 tablet by mouth twice daily  07/15/18  Yes Satira Sark, MD  nitroGLYCERIN (NITROSTAT) 0.4 MG SL tablet DISSOLVE 1 TABLET UNDER TONGUE EVERY 5 MINUTES UP TO 15 MIN FOR CHESTPAIN. IF NO RELIEF CALL 911. 11/08/17  Yes Satira Sark, MD  ONE TOUCH ULTRA TEST test strip TEST BLOOD SUGAR UP TO 4 TIMES DAILY. 10/10/16  Yes Nida, Marella Chimes, MD  ONETOUCH DELICA LANCETS 93Z MISC USE AS DIRECTED TO TEST BLOOD SUGAR 4 TIMES DAILY. 10/10/16  Yes Cassandria Anger, MD  pravastatin (PRAVACHOL) 40 MG tablet Take 1 tablet (40 mg total) by mouth every evening. 02/25/19  Yes [name], MD  rOPINIRole (REQUIP) 4 MG tablet Take 1/2 (one-half) tablet by mouth twice daily Patient taking differently: Take 2 mg by mouth 2 (two) times daily.  08/04/19  Yes Lomax, Amy, NP  sodium chloride (OCEAN) 0.65 % SOLN nasal spray Place 1-2 sprays into both nostrils daily as needed for congestion.   Yes [provider]  Vitamin D, Ergocalciferol, (DRISDOL) 1.25 MG (50000 UNIT) CAPS capsule Take 1 capsule by mouth once a week 07/28/19  Yes Cassandria Anger, MD    Physical Exam: BP (!) 148/96   Pulse 79   Temp 98 F (36.7 C) (Oral)   Resp (!) 23   Ht 5\' 5"  (1.651 m)   Wt 97 kg   SpO2 96%   BMI 35.59 kg/m   . General: 69 y.o. year-old male well developed well nourished in no acute distress.  Alert and oriented x3. [name] HEENT: NCAT, EOMI . Neck: Supple, trachea medial . Cardiovascular: Regular rate and rhythm with no rubs or gallops.  No thyromegaly or JVD noted.  No lower extremity edema. 2/4 pulses in all 4 extremities. [name] Respiratory: Clear to auscultation with no wheezes or rales. Good inspiratory effort. . Abdomen: Soft nontender nondistended with normal bowel sounds x4 quadrants. . Muskuloskeletal: No cyanosis, clubbing or edema noted bilaterally . Neuro: CN II-XII intact, decreased RUE and RLE sensation to light touch (compared to left side).   . Skin: No ulcerative lesions noted or rashes . Psychiatry: Judgement  and insight appear normal. Mood is appropriate for condition and setting  Labs on Admission:  Basic Metabolic Panel: Recent Labs  Lab 08/04/19 1722  NA 132*  K 3.8  CL 101  CO2 24  GLUCOSE 302*  BUN 26*  CREATININE 1.03  CALCIUM 9.1   Liver Function Tests: Recent Labs  Lab 08/04/19 1722  AST 17  ALT 20  ALKPHOS 63  BILITOT 0.2*  PROT 7.6  ALBUMIN 4.5   No results for input(s): LIPASE, AMYLASE in the last 168 hours. No results for input(s): AMMONIA in the last 168 hours. CBC: Recent Labs  Lab 08/04/19 1722  WBC 9.6  HGB 12.9*  HCT 40.3  MCV 92.4  PLT 275   Cardiac Enzymes: No results for input(s): CKTOTAL, CKMB, CKMBINDEX, TROPONINI in the last 168 hours.  BNP (last 3 results) Recent Labs    09/11/18 0949  BNP 23.6    ProBNP (last 3 results) No results for input(s): PROBNP in the last 8760 hours.  CBG: Recent Labs  Lab 08/04/19 1718  GLUCAP 277*    Radiological Exams on Admission: MR BRAIN WO CONTRAST  Result Date: 08/04/2019 CLINICAL DATA:  Acute neuro deficit. Dizziness ataxia right-sided numbness. Headache 2 days EXAM: MRI HEAD WITHOUT CONTRAST TECHNIQUE: Multiplanar, multiecho pulse sequences of the brain and surrounding structures were obtained without intravenous contrast. COMPARISON:  MRI head 02/19/2019 FINDINGS: Brain: Negative for acute infarct Mild chronic ischemic change. Small chronic infarct in the left frontal cortex. Small chronic white matter infarcts left frontal parietal white matter. Negative for hemorrhage or mass. Ventricle size normal. Vascular: Normal arterial flow voids. Skull and upper cervical spine: No focal skeletal lesion. Sinuses/Orbits: Mild mucosal edema paranasal sinuses. Bilateral cataract extraction. Other: None IMPRESSION: Negative for acute infarct. Mild chronic ischemic changes on the left. Electronically Signed   By: Franchot Gallo M.D.   On: 08/04/2019 19:20    EKG: I independently viewed the EKG done  and my findings are as followed: Sinus rhythm at rate of 69 bpm  Assessment/Plan Present on Admission: . RLS (restless legs syndrome) . Essential hypertension, benign  Active Problems:   Hyperglycemia due to type 2 diabetes mellitus (HCC)   Essential hypertension, benign   OSA on CPAP   RLS (restless legs syndrome)   Dizziness   Vertigo   Paresthesia of skin   Asthma   Obesity (BMI 35.0-39.9 without comorbidity)   Dizziness/vertigo/skin paresthesias rule out acute ischemic stroke MRI head without contrast showed no acute infarct but showed mild chronic ischemic changes on the left. Teleneurology was consulted and recommended repeat MRI brain and MR angiogram head and neck after 24 to 48 hours to look for evidence of evolving ischemia Patient will be admitted to telemetry unit  Echocardiogram done in February 2021 showed an EF of 55 to 60% with low ventricular diastolic parameters consistent with grade 1 diastolic dysfunction (impaired occlusion).  Echocardiogram will be done in the morning MRI of brain without contrast and MRA of head and neck will be done 24 hours from MRI done on admission per teleneurology recommendation Continue aspirin, Plavix and statin Continue fall precautions and neuro checks Last HgbA1c was on 08/01/19-9.6 Lipid panel will be checked Continue PT/OT eval and treat Bedside swallow eval by nursing prior to diet Neurology was consulted and we shall await further recommendation.    Hyperglycemia secondary to type 2 diabetes mellitus Continue insulin sliding scale and hypoglycemia protocol  Restless leg syndrome Continue Requip per home regimen  Essential hypertension Permissive hypertension will be allowed at this time  Asthma Continue  home bronchodilators   OSA on CPAP Continue CPAP at night  Class II obesity (BMI 35.59) Patient counseled on diet and exercise. PMI completed based on height 5\' 5"  and weight 97 kg  DVT prophylaxis: SCDs  Code  Status: Full code  Family Communication: None at bedside  Disposition Plan:  Patient is from:                        home Anticipated DC to:                   SNF or family members home Anticipated DC date:               2-3 days Anticipated DC barriers:          Unstable to go to discharge due to pending further stroke work-up, as well as for the neurologist's recommendation and PT/OT eval, treat and recommendation.  Consults called: Neurology, telemetry neurology  Admission status: Inpatient  Bernadette Hoit MD [facility]  If 7PM-7AM, please contact night-coverage [email or web address]  08/05/2019, 1:51 AM

## 2019-08-04 NOTE — ED Notes (Signed)
Pt ambulatory with assistance to bathroom

## 2019-08-04 NOTE — ED Provider Notes (Signed)
[facility] EMERGENCY DEPARTMENT Provider Note   CSN: [id number] Arrival date & time: 08/04/19  1708     History Chief Complaint  Patient presents with  . Dizziness    TAWFIQ FAVILA is a 69 y.o. male.  Presenting to ER with concern for right-sided numbness, dizziness.  Yesterday started to have some intermittent dizziness and slightly blurred vision.  No alleviating or aggravating factors.  Today when he woke up the dizziness seem to be more persistent, also today started having sensation of decreased sensation on his right arm and right leg.  Has been able to walk but feels somewhat unsteady.  Describes dizziness as unsteadiness, no room spinning sensation.  No speech changes.  Reports similar to episode in February, states he was told at that time he had old stroke.  CAD, DVT/PE, obesity, diabetes, stroke.  HPI     Past Medical History:  Diagnosis Date  . Arthritis   . Asthma   . Colon polyp   . Coronary atherosclerosis of native coronary artery    a. 2011: cath showing 90% stenosis along small non-dominant RCA (too small for PCI). b. 01/2018: cath showing nonobstructive CAD with 60 to 70% proximal to mid nondominant RCA stenosis, 50% mid LAD and 40 to 50% OM1  . DVT (deep venous thrombosis) ([place]) 2005   Right arm  . Essential hypertension, benign   . Headache   . History of transfusion   . Hypersomnia    CPAP of 16 cm, diagnosed with AHI of 60 in 2012,[place] 21- narcolepsy?  [name] Hypothyroidism   . Mixed hyperlipidemia   . Morbid obesity ([place])   . MRSA (methicillin resistant staph aureus) culture positive    08/2012  . Narcolepsy   . OSA (obstructive sleep apnea)    CPAP  . Osteoarthritis   . Pneumonia   . Psoriasis   . Pulmonary embolism ([place]) 2004  . RLS (restless legs syndrome)   . Rotator cuff disorder    Left  . Septic arthritis of knee, left ([place])   . Skin cancer, basal cell   . Type 2 diabetes mellitus [facility])     Patient Active Problem List    Diagnosis Date Noted  . Vertigo 08/04/2019  . Dizziness 02/18/2019  . Type 2 diabetes mellitus with hypoglycemia without coma ([place]) 03/31/2017  . AKI (acute kidney injury) ([place]) 03/30/2017  . Right patellar tendon rupture 12/04/2014  . Mixed hyperlipidemia 11/25/2014  . Other specified hypothyroidism 11/25/2014  . S/P right TKA 03/23/2014  . S/P knee replacement 03/23/2014  . Spinal stenosis of cervicothoracic region 10/08/2013  . RLS (restless legs syndrome) 10/08/2013  . DM type 2 causing vascular disease ([place]) 10/08/2013  . Preoperative cardiovascular examination 08/14/2013  . Sleep apnea with use of continuous positive airway pressure (CPAP) 04/02/2013  . 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/02/2013  . Diabetic neuropathy ([place]) 07/23/2012  . Obesity, morbid ([place]) 05/01/2012  . Hypersomnia, persistent 05/01/2012  . OSA on CPAP 04/19/2012  . Chest pain 04/19/2012  . Essential hypertension, benign 12/16/2009  . CORONARY ATHEROSCLEROSIS NATIVE CORONARY ARTERY 12/16/2009  . Accelerating angina ([place]) 11/30/2009    Past Surgical History:  Procedure Laterality Date  . BACK SURGERY    . CATARACT EXTRACTION Bilateral   . COLONOSCOPY    . CYST REMOVAL TRUNK     from back  . EYE SURGERY Left 2016   laser to left eye  . HIP SURGERY     bone removed from both  sides of hip  . KNEE ARTHROTOMY Right 12/04/2014   Procedure: KNEE ARTHROTOMY PATELLA LIGAMENT RECONSTRUSION AND REPAIR RIGHT KNEE;  Surgeon: Paralee Cancel, MD;  Location: [facility];  Service: Orthopedics;  Laterality: Right;  . KNEE SURGERY     X 25 TIMES  . LEFT HEART CATH AND CORONARY ANGIOGRAPHY N/A 01/17/2018   Procedure: LEFT HEART CATH AND CORONARY ANGIOGRAPHY;  Surgeon: Belva Crome, MD;  Location: [facility];  Service: Cardiovascular;  Laterality: N/A;  . LUMBAR DISC SURGERY     Left L3, L4, L5 discecotomy with decompression of L4 root  . TONSILLECTOMY    . TOTAL KNEE ARTHROPLASTY  2003   LEFT  .  TOTAL KNEE ARTHROPLASTY Right 03/23/2014   Procedure: RIGHT TOTAL KNEE ARTHROPLASTY AND REMOVAL RIGHT TIBIAL  DEEP IMPLANT STAPLE;  Surgeon: Paralee Cancel, MD;  Location: [facility];  Service: Orthopedics;  Laterality: Right;  . TOTAL KNEE REVISION  2005   LEFT  . WRIST SURGERY         Family History  Problem Relation Age of Onset  . Hypertension Father   . Heart attack Father   . Kidney Stones Father   . Seizures Grandchild   . Narcolepsy Grandchild   . Diabetes Sister     Social History   Tobacco Use  . Smoking status: Former Smoker    Packs/day: 1.50    Years: 10.00    Pack years: 15.00    Types: Cigarettes    Start date: 06/19/1967    Quit date: 01/03/1995    Years since quitting: 24.6  . Smokeless tobacco: Never Used  Vaping Use  . Vaping Use: Never used  Substance Use Topics  . Alcohol use: No    Alcohol/week: 0.0 standard drinks    Comment: quit drinking in 07/86  . Drug use: No    Home Medications Prior to Admission medications   Medication Sig Start Date End Date Taking? Authorizing Provider  albuterol (VENTOLIN HFA) 108 (90 Base) MCG/ACT inhaler Inhale 2 puffs into the lungs every 6 (six) hours as needed for wheezing or shortness of breath. 12/12/18  Yes [name], MD  alfuzosin (UROXATRAL) 10 MG 24 hr tablet Take 10 mg by mouth daily with breakfast.   Yes [provider]  amLODipine (NORVASC) 5 MG tablet TAKE 1 TABLET BY MOUTH ONCE DAILY **DOSE  INCREASE  05/21/2018** Patient taking differently: Take 5 mg by mouth daily.  06/30/19  Yes Satira Sark, MD  B-D ULTRAFINE III SHORT PEN 31G X 8 MM MISC USING FIVE TIMES DAILY. 11/06/16  Yes Cassandria Anger, MD  clopidogrel (PLAVIX) 75 MG tablet Take 1 tablet (75 mg total) by mouth daily. 06/03/19  Yes Lovena Le, Malena M, DO  Continuous Blood Gluc Receiver (DEXCOM G6 RECEIVER) DEVI 1 Piece by Does not apply route as needed. 04/13/18  Yes Nida, Marella Chimes, MD  Continuous Blood Gluc Sensor (DEXCOM  G6 SENSOR) MISC 4 Pieces by Does not apply route once a week. 05/15/18  Yes Nida, Marella Chimes, MD  fluticasone furoate-vilanterol (BREO ELLIPTA) 200-25 MCG/INH AEPB Inhale 1 puff into the lungs daily. 01/13/19  Yes Chesley Mires, MD  furosemide (LASIX) 40 MG tablet Take 40 mg by mouth. Take 40 mg alternating with 80 mg Daily   Yes [provider]  glipiZIDE (GLUCOTROL XL) 5 MG 24 hr tablet Take 1 tablet (5 mg total) by mouth daily with breakfast. 08/01/19  Yes Nida, Marella Chimes, MD  insulin regular  human CONCENTRATED (HUMULIN R U-500 KWIKPEN) 500 UNIT/ML kwikpen Inject 60-70 Units into the skin 3 (three) times daily with meals. Per sliding scale. 08/01/19  Yes Nida, Marella Chimes, MD  isosorbide mononitrate (IMDUR) 60 MG 24 hr tablet TAKE 1 TABLET BY MOUTH IN THE MORNING AND 1/2 (ONE-HALF) IN THE EVENING 06/30/19  Yes Satira Sark, MD  loratadine (CLARITIN) 10 MG tablet Take 10 mg by mouth daily as needed for allergies.    Yes [provider]  losartan (COZAAR) 50 MG tablet Take 1 tablet (50 mg total) by mouth daily. 07/21/19  Yes Lovena Le, Malena M, DO  metFORMIN (GLUCOPHAGE) 500 MG tablet TAKE 1 TABLET BY MOUTH TWICE DAILY WITH MEALS 06/30/19  Yes Nida, Marella Chimes, MD  metoprolol succinate (TOPROL-XL) 50 MG 24 hr tablet Take 1 tablet by mouth twice daily 07/15/18  Yes Satira Sark, MD  nitroGLYCERIN (NITROSTAT) 0.4 MG SL tablet DISSOLVE 1 TABLET UNDER TONGUE EVERY 5 MINUTES UP TO 15 MIN FOR CHESTPAIN. IF NO RELIEF CALL 911. 11/08/17  Yes Satira Sark, MD  ONE TOUCH ULTRA TEST test strip TEST BLOOD SUGAR UP TO 4 TIMES DAILY. 10/10/16  Yes Nida, Marella Chimes, MD  ONETOUCH DELICA LANCETS 60F MISC USE AS DIRECTED TO TEST BLOOD SUGAR 4 TIMES DAILY. 10/10/16  Yes Cassandria Anger, MD  pravastatin (PRAVACHOL) 40 MG tablet Take 1 tablet (40 mg total) by mouth every evening. 02/25/19  Yes [name], MD  rOPINIRole (REQUIP) 4 MG tablet Take 1/2 (one-half)  tablet by mouth twice daily Patient taking differently: Take 2 mg by mouth 2 (two) times daily.  08/04/19  Yes Lomax, Amy, NP  sodium chloride (OCEAN) 0.65 % SOLN nasal spray Place 1-2 sprays into both nostrils daily as needed for congestion.   Yes [provider]  Vitamin D, Ergocalciferol, (DRISDOL) 1.25 MG (50000 UNIT) CAPS capsule Take 1 capsule by mouth once a week 07/28/19  Yes Nida, Marella Chimes, MD    Allergies    Tape, Cardizem [diltiazem hcl], Cardura [doxazosin mesylate], Lipitor [atorvastatin], Paxil [paroxetine hcl], Codeine, and Gabapentin  Review of Systems   Review of Systems  Constitutional: Negative for chills and fever.  HENT: Negative for ear pain and sore throat.   Eyes: Negative for pain and visual disturbance.  Respiratory: Negative for cough and shortness of breath.   Cardiovascular: Negative for chest pain and palpitations.  Gastrointestinal: Negative for abdominal pain and vomiting.  Genitourinary: Negative for dysuria and hematuria.  Musculoskeletal: Negative for arthralgias and back pain.  Skin: Negative for color change and rash.  Neurological: Positive for dizziness, light-headedness and numbness. Negative for seizures and syncope.  All other systems reviewed and are negative.   Physical Exam Updated Vital Signs BP (!) 131/92   Pulse 67   Temp 98 F (36.7 C) (Oral)   Resp 14   Ht 5\' 5"  (1.651 m)   Wt 97 kg   SpO2 97%   BMI 35.59 kg/m   Physical Exam Vitals and nursing note reviewed.  Constitutional:      Appearance: He is well-developed.  HENT:     Head: Normocephalic and atraumatic.  Eyes:     Conjunctiva/sclera: Conjunctivae normal.  Cardiovascular:     Rate and Rhythm: Normal rate and regular rhythm.     Heart sounds: No murmur heard.   Pulmonary:     Effort: Pulmonary effort is normal. No respiratory distress.     Breath sounds: Normal breath sounds.  Abdominal:  Palpations: Abdomen is soft.     Tenderness: There is  no abdominal tenderness.  Musculoskeletal:     Cervical back: Neck supple.  Skin:    General: Skin is warm and dry.  Neurological:     Mental Status: He is alert.     Comments: AAOx3 CN 2-12 intact, speech clear visual fields intact 5/5 strength in b/l UE and LE Sensation to light touch decreased and right upper extremity, right lower extremity Normal FNF  Broad-based, somewhat shuffling gait     ED Results / Procedures / Treatments   Labs (all labs ordered are listed, but only abnormal results are displayed) Labs Reviewed  CBC - Abnormal; Notable for the following components:      Result Value   Hemoglobin 12.9 (*)    All other components within normal limits  COMPREHENSIVE METABOLIC PANEL - Abnormal; Notable for the following components:   Sodium 132 (*)    Glucose, Bld 302 (*)    BUN 26 (*)    Total Bilirubin 0.2 (*)    All other components within normal limits  URINALYSIS, ROUTINE W REFLEX MICROSCOPIC - Abnormal; Notable for the following components:   Color, Urine STRAW (*)    Glucose, UA >=500 (*)    Protein, ur 30 (*)    All other components within normal limits  CBG MONITORING, ED - Abnormal; Notable for the following components:   Glucose-Capillary 277 (*)    All other components within normal limits  SARS CORONAVIRUS 2 BY RT PCR (HOSPITAL ORDER, [facility])  RAPID URINE DRUG SCREEN, HOSP PERFORMED  ETHANOL  COMPREHENSIVE METABOLIC PANEL  CBC  PROTIME-INR  APTT  MAGNESIUM  PHOSPHORUS  TROPONIN I (HIGH SENSITIVITY)    EKG EKG Interpretation  Date/Time:  Monday August 04 2019 17:28:35 EDT Ventricular Rate:  69 PR Interval:    QRS Duration: 102 QT Interval:  403 QTC Calculation: 432 R Axis:   -13 Text Interpretation: Sinus rhythm Confirmed by Madalyn Rob [phone]) on 08/04/2019 6:15:00 PM   Radiology MR BRAIN WO CONTRAST  Result Date: 08/04/2019 CLINICAL DATA:  Acute neuro deficit. Dizziness ataxia right-sided  numbness. Headache 2 days EXAM: MRI HEAD WITHOUT CONTRAST TECHNIQUE: Multiplanar, multiecho pulse sequences of the brain and surrounding structures were obtained without intravenous contrast. COMPARISON:  MRI head 02/19/2019 FINDINGS: Brain: Negative for acute infarct Mild chronic ischemic change. Small chronic infarct in the left frontal cortex. Small chronic white matter infarcts left frontal parietal white matter. Negative for hemorrhage or mass. Ventricle size normal. Vascular: Normal arterial flow voids. Skull and upper cervical spine: No focal skeletal lesion. Sinuses/Orbits: Mild mucosal edema paranasal sinuses. Bilateral cataract extraction. Other: None IMPRESSION: Negative for acute infarct. Mild chronic ischemic changes on the left. Electronically Signed   By: Franchot Gallo M.D.   On: 08/04/2019 19:20    Procedures Procedures (including critical care time)  Medications Ordered in ED Medications  meclizine (ANTIVERT) tablet 25 mg (25 mg Oral Given 08/04/19 1933)    ED Course  I have reviewed the triage vital signs and the nursing notes.  Pertinent labs & imaging results that were available during my care of the patient were reviewed by me and considered in my medical decision making (see chart for details).  Clinical Course as of Aug 04 2355  Mon Aug 04, 2019  1951 Rechecked, still feels some tingling in right side but no ongoing dizziness, will d/w neuro   [RD]  2026 D/w [place], he  recommends Tele Neuro consult   [RD]    Clinical Course User Index [RD] Lucrezia Starch, MD   MDM Rules/Calculators/A&P                         69 year old male presents to ER with concern for dizziness, right-sided numbness. On exam patient was well-appearing with stable vital signs. On neurologic exam, noted decrease sensation over right arm, right leg as well as some difficulty walking. MRI was negative for acute stroke. Teleneurology evaluated patient, he was concerned for possible diffusion  negative ischemic stroke and recommended admission for observation, therapy evaluations and potentially repeat MRI. Discussed case with hospitalist service, Dr. Josephine Cables who will admit.  Final Clinical Impression(s) / ED Diagnoses Final diagnoses:  Dizziness  Right sided numbness  Stroke-like symptoms    Rx / DC Orders ED Discharge Orders    None       Lucrezia Starch, MD 08/04/19 2357

## 2019-08-04 NOTE — ED Notes (Signed)
Pt given meal tray to eat.

## 2019-08-05 ENCOUNTER — Inpatient Hospital Stay (HOSPITAL_COMMUNITY): Payer: Medicare HMO

## 2019-08-05 DIAGNOSIS — G8191 Hemiplegia, unspecified affecting right dominant side: Secondary | ICD-10-CM | POA: Diagnosis not present

## 2019-08-05 DIAGNOSIS — R531 Weakness: Secondary | ICD-10-CM

## 2019-08-05 DIAGNOSIS — J4520 Mild intermittent asthma, uncomplicated: Secondary | ICD-10-CM

## 2019-08-05 DIAGNOSIS — R202 Paresthesia of skin: Secondary | ICD-10-CM

## 2019-08-05 DIAGNOSIS — I10 Essential (primary) hypertension: Secondary | ICD-10-CM

## 2019-08-05 DIAGNOSIS — J45909 Unspecified asthma, uncomplicated: Secondary | ICD-10-CM

## 2019-08-05 DIAGNOSIS — E669 Obesity, unspecified: Secondary | ICD-10-CM

## 2019-08-05 DIAGNOSIS — E66812 Obesity, class 2: Secondary | ICD-10-CM

## 2019-08-05 DIAGNOSIS — Z794 Long term (current) use of insulin: Secondary | ICD-10-CM

## 2019-08-05 LAB — LIPID PANEL
Cholesterol: 177 mg/dL (ref 0–200)
HDL: 56 mg/dL (ref >40)
LDL Cholesterol: 102 mg/dL — ABNORMAL HIGH (ref 0–99)
Total CHOL/HDL Ratio: 3.2 RATIO
Triglycerides: 95 mg/dL (ref <150)
VLDL: 19 mg/dL (ref 0–40)

## 2019-08-05 LAB — COMPREHENSIVE METABOLIC PANEL
ALT: 18 U/L (ref 0–44)
AST: 15 U/L (ref 15–41)
Albumin: 3.8 g/dL (ref 3.5–5.0)
Alkaline Phosphatase: 52 U/L (ref 38–126)
Anion gap: 8 (ref 5–15)
BUN: 22 mg/dL (ref 8–23)
CO2: 25 mmol/L (ref 22–32)
Calcium: 9 mg/dL (ref 8.9–10.3)
Chloride: 102 mmol/L (ref 98–111)
Creatinine, Ser: 0.94 mg/dL (ref 0.61–1.24)
GFR calc Af Amer: >60 mL/min (ref >60)
GFR calc non Af Amer: >60 mL/min (ref >60)
Glucose, Bld: 336 mg/dL — ABNORMAL HIGH (ref 70–99)
Potassium: 4.2 mmol/L (ref 3.5–5.1)
Sodium: 135 mmol/L (ref 135–145)
Total Bilirubin: 0.4 mg/dL (ref 0.3–1.2)
Total Protein: 6.9 g/dL (ref 6.5–8.1)

## 2019-08-05 LAB — MAGNESIUM: Magnesium: 1.9 mg/dL (ref 1.7–2.4)

## 2019-08-05 LAB — PROTIME-INR
INR: 0.9 (ref 0.8–1.2)
Prothrombin Time: 12.2 seconds (ref 11.4–15.2)

## 2019-08-05 LAB — CBG MONITORING, ED
Glucose-Capillary: 370 mg/dL — ABNORMAL HIGH (ref 70–99)
Glucose-Capillary: 370 mg/dL — ABNORMAL HIGH (ref 70–99)
Glucose-Capillary: 395 mg/dL — ABNORMAL HIGH (ref 70–99)
Glucose-Capillary: 383 mg/dL — ABNORMAL HIGH (ref 70–99)

## 2019-08-05 LAB — CBC
HCT: 39.5 % (ref 39.0–52.0)
Hemoglobin: 12.5 g/dL — ABNORMAL LOW (ref 13.0–17.0)
MCH: 29.1 pg (ref 26.0–34.0)
MCHC: 31.6 g/dL (ref 30.0–36.0)
MCV: 91.9 fL (ref 80.0–100.0)
Platelets: 241 10*3/uL (ref 150–400)
RBC: 4.3 MIL/uL (ref 4.22–5.81)
RDW: 13.5 % (ref 11.5–15.5)
WBC: 8.5 10*3/uL (ref 4.0–10.5)
nRBC: 0 % (ref 0.0–0.2)

## 2019-08-05 LAB — ECHOCARDIOGRAM COMPLETE
Area-P 1/2: 4.24 cm2
Height: 65 in
S' Lateral: 3.29 cm
Weight: 3421.54 oz

## 2019-08-05 LAB — APTT: aPTT: 26 seconds (ref 24–36)

## 2019-08-05 LAB — PHOSPHORUS: Phosphorus: 3.4 mg/dL (ref 2.5–4.6)

## 2019-08-05 MED ORDER — ALBUTEROL SULFATE HFA 108 (90 BASE) MCG/ACT IN AERS: 2.0000 | INHALATION_SPRAY | Freq: Four times a day (QID) | RESPIRATORY_TRACT | Status: DC | PRN

## 2019-08-05 MED ORDER — FLUTICASONE FUROATE-VILANTEROL 200-25 MCG/INH IN AEPB
1.0000 | INHALATION_SPRAY | Freq: Every day | RESPIRATORY_TRACT | Status: DC
  Administered 2019-08-05: 1 via RESPIRATORY_TRACT
  Filled 2019-08-05: qty 28

## 2019-08-05 MED ORDER — SALINE SPRAY 0.65 % NA SOLN: 1.0000 | Freq: Every day | NASAL | Status: DC | PRN

## 2019-08-05 MED ORDER — INSULIN ASPART 100 UNIT/ML ~~LOC~~ SOLN
0.0000 [IU] | Freq: Three times a day (TID) | SUBCUTANEOUS | Status: DC
  Administered 2019-08-05 (×2): 15 [IU] via SUBCUTANEOUS
  Filled 2019-08-05 (×2): qty 1

## 2019-08-05 MED ORDER — ASPIRIN EC 81 MG PO TBEC: 81.0000 mg | DELAYED_RELEASE_TABLET | Freq: Every day | ORAL | 0 refills | Status: AC

## 2019-08-05 MED ORDER — PRAVASTATIN SODIUM 40 MG PO TABS
40.0000 mg | ORAL_TABLET | Freq: Every evening | ORAL | Status: DC
  Filled 2019-08-05 (×2): qty 1

## 2019-08-05 MED ORDER — CLOPIDOGREL BISULFATE 75 MG PO TABS
75.0000 mg | ORAL_TABLET | Freq: Every day | ORAL | Status: DC
  Administered 2019-08-05: 75 mg via ORAL
  Filled 2019-08-05: qty 1

## 2019-08-05 MED ORDER — GADOBUTROL 1 MMOL/ML IV SOLN
10.0000 mL | Freq: Once | INTRAVENOUS | Status: AC | PRN
  Administered 2019-08-05: 10 mL via INTRAVENOUS

## 2019-08-05 MED ORDER — LORATADINE 10 MG PO TABS: 10.0000 mg | ORAL_TABLET | Freq: Every day | ORAL | Status: DC | PRN

## 2019-08-05 MED ORDER — ROPINIROLE HCL 1 MG PO TABS
2.0000 mg | ORAL_TABLET | Freq: Two times a day (BID) | ORAL | Status: DC
  Administered 2019-08-05 (×2): 2 mg via ORAL
  Filled 2019-08-05 (×6): qty 2

## 2019-08-05 MED ORDER — ASPIRIN EC 81 MG PO TBEC
81.0000 mg | DELAYED_RELEASE_TABLET | Freq: Every day | ORAL | Status: DC
  Administered 2019-08-05: 81 mg via ORAL
  Filled 2019-08-05: qty 1

## 2019-08-05 MED ORDER — INSULIN GLARGINE 100 UNIT/ML ~~LOC~~ SOLN: 15.0000 [IU] | Freq: Every day | SUBCUTANEOUS | Status: DC

## 2019-08-05 MED ORDER — ACETAMINOPHEN 325 MG PO TABS: 650.0000 mg | ORAL_TABLET | Freq: Four times a day (QID) | ORAL | Status: DC | PRN

## 2019-08-05 MED ORDER — INSULIN ASPART 100 UNIT/ML ~~LOC~~ SOLN: 5.0000 [IU] | Freq: Three times a day (TID) | SUBCUTANEOUS | Status: DC

## 2019-08-05 MED ORDER — INSULIN REGULAR HUMAN (CONC) 500 UNIT/ML ~~LOC~~ SOPN
15.0000 [IU] | PEN_INJECTOR | Freq: Three times a day (TID) | SUBCUTANEOUS | Status: DC
  Administered 2019-08-05: 15 [IU] via SUBCUTANEOUS
  Filled 2019-08-05: qty 3

## 2019-08-05 MED ORDER — INSULIN ASPART 100 UNIT/ML ~~LOC~~ SOLN: 0.0000 [IU] | Freq: Every day | SUBCUTANEOUS | Status: DC

## 2019-08-05 NOTE — ED Notes (Signed)
Pt was given a dinner tray.  

## 2019-08-05 NOTE — ED Notes (Signed)
Dr. Tana Coast sent me a message asking me to contact Dr. Pincus Badder so they may speak with the pt.

## 2019-08-05 NOTE — Plan of Care (Addendum)
  Problem: Acute Rehab PT Goals(only PT should resolve) Goal: Pt Will Go Supine/Side To Sit Outcome: Progressing Flowsheets (Taken 08/05/2019 1037) Pt will go Supine/Side to Sit: with modified independence Goal: Patient Will Transfer Sit To/From Stand Outcome: Progressing Flowsheets (Taken 08/05/2019 1037) Patient will transfer sit to/from stand: . with supervision . with min guard assist Goal: Pt Will Transfer Bed To Chair/Chair To Bed Outcome: Progressing Flowsheets (Taken 08/05/2019 1037) Pt will Transfer Bed to Chair/Chair to Bed: min guard assist Goal: Pt Will Ambulate Outcome: Progressing Flowsheets (Taken 08/05/2019 1037) Pt will Ambulate: 75 feet   11:14 AM , 08/05/19 Karlyn Agee, SPT Physical Therapy with [place]  [facility] [phone] office  During this treatment session, the therapist was present, participating in and directing the treatment.  11:29 AM, 08/05/19 Lonell Grandchild, MPT Physical Therapist with [facility] [phone] office [phone] mobile phone

## 2019-08-05 NOTE — Progress Notes (Signed)
[facility]                                                                              Patient Demographics  Paul Baker, is a 69 y.o. male, DOB - Feb 16, 1950, FBP:[id number]  Admit date - 08/04/2019   Admitting Physician Bernadette Hoit, DO  Outpatient Primary MD for the patient is Erven Colla, DO  Outpatient specialists:   LOS - 1  days   Medical records reviewed and are as summarized below:    Chief Complaint  Patient presents with  . Dizziness       Brief summary   Patient is a 69 year old male with history of CAD, hypertension, diabetes mellitus, type II, IDDM, restless leg syndrome, presented to ED with dizziness, slightly blurred vision on waking up on the day of admission.  Patient also reported similar episode in 02/2019 when he was admitted.  He also reported unsteadiness on walking, he felt weakness in his legs right worse than left with right-sided hand and arm numbness and paresthesias. In ED, he was mildly tachypneic, otherwise stable. BUN 26, blood glucose of 302.  MRI head without contrast showed no acute infarct, showed mild chronic ischemic changes on the left.  Urine drug screen was negative, UA negative for UTI, alcohol level less than 10, troponin x1 -.   Assessment & Plan    Principal Problem:   Right sided weakness, paresthesias, dizziness -Per patient, he has not ambulated yet, right-sided weakness is improving however still feels right hand and arm numbness. -MRI of the brain showed no acute infarct but showed mild chronic ischemic changes on the left -Telemetry neurology was consulted, recommended repeating MRI brain and MR angiogram of head and neck after 24 to 48 hours to look for evidence of evolving ischemia -Neurology consulted, discussed with Dr. Merlene Laughter, will await recommendations -2D echo 02/2019 showed EF of 55 to 82%, grade 1 diastolic dysfunction -Risk factors include uncontrolled diabetes, hypertension,  hyperlipidemia -Hemoglobin A1c 9.6, fasting CBG 336 this morning, LDL 102, goal less than 70 -Continue aspirin, Plavix, pravastatin -PT evaluation recommended skilled nursing facility, social work consult placed  Active Problems:  type 2 diabetes mellitus ([place]), IDDM, uncontrolled with hyperglycemia -A1c 9.6, CBGs uncontrolled in 300s - placed on Lantus 15 units daily, NovoLog meal coverage 5 units 3 times daily AC, sliding scale insulin -Diabetic coordinator consult placed.    Essential hypertension, benign -Permissive hypertension due to possibility of ?evolving CVA, BP currently stable    OSA on CPAP    RLS (restless legs syndrome) -Continue Requip    Asthma -Currently stable, no wheezing, continue home bronchodilators    Obesity (BMI 35.0-39.9 without comorbidity) Estimated body mass index is 35.59 kg/m as calculated from the following:   Height as of this encounter: 5\' 5"  (1.651 m).   Weight as of this encounter: 97 kg.  Code Status:  DVT Prophylaxis:  SCD's Family Communication: Discussed all imaging results, lab results, explained to the patient's wife on the phone    Disposition Plan:     Status is: Inpatient  Remains inpatient appropriate because:Inpatient level of care appropriate due to severity of  illness   Dispo: The patient is from: Home              Anticipated d/c is to: SNF              Anticipated d/c date is: 2 days              Patient currently is not medically stable to d/c.,  Awaiting stroke work-up completion, neurology commendations, needs SNF per PT evaluation      Time Spent in minutes   35 minutes  Procedures:  MRI brain  Consultants:   Neurology Diabetic coordinator   Antimicrobials:   Anti-infectives (From admission, onward)   None         Medications  Scheduled Meds: . aspirin EC  81 mg Oral Daily  . clopidogrel  75 mg Oral Daily  . fluticasone furoate-vilanterol  1 puff Inhalation Daily  . insulin aspart  0-15  Units Subcutaneous TID WC  . insulin aspart  0-5 Units Subcutaneous QHS  . pravastatin  40 mg Oral QPM  . rOPINIRole  2 mg Oral BID   Continuous Infusions: PRN Meds:.acetaminophen, albuterol, loratadine, sodium chloride      Subjective:   Paul Baker was seen and examined today.  Still feeling somewhat dizzy and wobbly.  Reported his right leg weakness is somewhat better from admission however still not at baseline, also having numbness and paresthesias in his right arm and hand.  Patient denies chest pain, shortness of breath, abdominal pain, N/V/D/C. No acute events overnight.    Objective:   Vitals:   08/05/19 0202 08/05/19 0231 08/05/19 0300 08/05/19 0621  BP: 140/81 140/88 (!) 141/87 (!) 154/77  Pulse: 82 81 77 71  Resp: (!) 21 20 (!) 23 10  Temp:      TempSrc:      SpO2: 97% 97% 96% 96%  Weight:      Height:        Intake/Output Summary (Last 24 hours) at 08/05/2019 1138 Last data filed at 08/05/2019 0010 Gross per 24 hour  Intake --  Output 400 ml  Net -400 ml     Wt Readings from Last 3 Encounters:  08/04/19 97 kg  08/04/19 97.3 kg  08/01/19 (!) 97.1 kg     Exam  General: Alert and oriented x 3, NAD  Cardiovascular: S1 S2 auscultated, no murmurs, RRR  Respiratory: Clear to auscultation bilaterally, no wheezing, rales or rhonchi  Gastrointestinal: Soft, nontender, nondistended, + bowel sounds  Ext: no pedal edema bilaterally  Neuro: right lower extremity 4/5, LLE 5/5, bilateral upper extremities 5/5,+ numbness sensation in RUE.  Gait not assessed  Musculoskeletal: No digital cyanosis, clubbing  Skin: No rashes  Psych: Normal affect and demeanor, alert and oriented x3    Data Reviewed:  I have personally reviewed following labs and imaging studies  Micro Results Recent Results (from the past 240 hour(s))  SARS Coronavirus 2 by RT PCR (hospital order, performed in [facility] hospital lab) Nasopharyngeal Nasopharyngeal Swab     Status: None    Collection Time: 08/04/19 10:12 PM   Specimen: Nasopharyngeal Swab  Result Value Ref Range Status   SARS Coronavirus 2 NEGATIVE NEGATIVE Final    Comment: (NOTE) SARS-CoV-2 target nucleic acids are NOT DETECTED.  The SARS-CoV-2 RNA is generally detectable in upper and lower respiratory specimens during the acute phase of infection. The lowest concentration of SARS-CoV-2 viral copies this assay can detect is 250 copies / mL. A negative result does  not preclude SARS-CoV-2 infection and should not be used as the sole basis for treatment or other patient management decisions.  A negative result may occur with improper specimen collection / handling, submission of specimen other than nasopharyngeal swab, presence of viral mutation(s) within the areas targeted by this assay, and inadequate number of viral copies (<250 copies / mL). A negative result must be combined with clinical observations, patient history, and epidemiological information.  Fact Sheet for Patients:   StrictlyIdeas.no  Fact Sheet for Healthcare Providers: BankingDealers.co.za  This test is not yet approved or  cleared by the [place] FDA and has been authorized for detection and/or diagnosis of SARS-CoV-2 by FDA under an Emergency Use Authorization (EUA).  This EUA will remain in effect (meaning this test can be used) for the duration of the COVID-19 declaration under Section 564(b)(1) of the Act, 21 U.S.C. section 360bbb-3(b)(1), unless the authorization is terminated or revoked sooner.  Performed at [facility], [address], [place], [place] [address]     Radiology Reports MR BRAIN WO CONTRAST  Result Date: 08/04/2019 CLINICAL DATA:  Acute neuro deficit. Dizziness ataxia right-sided numbness. Headache 2 days EXAM: MRI HEAD WITHOUT CONTRAST TECHNIQUE: Multiplanar, multiecho pulse sequences of the brain and surrounding structures were obtained without  intravenous contrast. COMPARISON:  MRI head 02/19/2019 FINDINGS: Brain: Negative for acute infarct Mild chronic ischemic change. Small chronic infarct in the left frontal cortex. Small chronic white matter infarcts left frontal parietal white matter. Negative for hemorrhage or mass. Ventricle size normal. Vascular: Normal arterial flow voids. Skull and upper cervical spine: No focal skeletal lesion. Sinuses/Orbits: Mild mucosal edema paranasal sinuses. Bilateral cataract extraction. Other: None IMPRESSION: Negative for acute infarct. Mild chronic ischemic changes on the left. Electronically Signed   By: Franchot Gallo M.D.   On: 08/04/2019 19:20    Lab Data:  CBC: Recent Labs  Lab 08/04/19 1722 08/05/19 0334  WBC 9.6 8.5  HGB 12.9* 12.5*  HCT 40.3 39.5  MCV 92.4 91.9  PLT 275 233   Basic Metabolic Panel: Recent Labs  Lab 08/04/19 1722 08/05/19 0334  NA 132* 135  K 3.8 4.2  CL 101 102  CO2 24 25  GLUCOSE 302* 336*  BUN 26* 22  CREATININE 1.03 0.94  CALCIUM 9.1 9.0  MG  --  1.9  PHOS  --  3.4   GFR: Estimated Creatinine Clearance: 80.5 mL/min (by C-G formula based on SCr of 0.94 mg/dL). Liver Function Tests: Recent Labs  Lab 08/04/19 1722 08/05/19 0334  AST 17 15  ALT 20 18  ALKPHOS 63 52  BILITOT 0.2* 0.4  PROT 7.6 6.9  ALBUMIN 4.5 3.8   No results for input(s): LIPASE, AMYLASE in the last 168 hours. No results for input(s): AMMONIA in the last 168 hours. Coagulation Profile: Recent Labs  Lab 08/05/19 0334  INR 0.9   Cardiac Enzymes: No results for input(s): CKTOTAL, CKMB, CKMBINDEX, TROPONINI in the last 168 hours. BNP (last 3 results) No results for input(s): PROBNP in the last 8760 hours. HbA1C: No results for input(s): HGBA1C in the last 72 hours. CBG: Recent Labs  Lab 08/04/19 1718 08/05/19 0808  GLUCAP 277* 370*  370*   Lipid Profile: Recent Labs    08/05/19 0334  CHOL 177  HDL 56  LDLCALC 102*  TRIG 95  CHOLHDL 3.2   Thyroid Function  Tests: No results for input(s): TSH, T4TOTAL, FREET4, T3FREE, THYROIDAB in the last 72 hours. Anemia Panel: No results for input(s):  VITAMINB12, FOLATE, FERRITIN, TIBC, IRON, RETICCTPCT in the last 72 hours. Urine analysis:    Component Value Date/Time   COLORURINE STRAW (A) 08/04/2019 1915   APPEARANCEUR CLEAR 08/04/2019 1915   LABSPEC 1.008 08/04/2019 1915   PHURINE 6.0 08/04/2019 1915   GLUCOSEU >=500 (A) 08/04/2019 1915   HGBUR NEGATIVE 08/04/2019 1915   BILIRUBINUR NEGATIVE 08/04/2019 [place] 08/04/2019 1915   PROTEINUR 30 (A) 08/04/2019 1915   UROBILINOGEN 0.2 03/13/2014 1149   NITRITE NEGATIVE 08/04/2019 1915   LEUKOCYTESUR NEGATIVE 08/04/2019 1915     Armonii Sieh M.D. [facility] 08/05/2019, 11:38 AM   Call night coverage person covering after 7pm

## 2019-08-05 NOTE — TOC Initial Note (Signed)
Transition of Care [facility]) - Initial/Assessment Note   Patient Details  Name: Paul Baker MRN: [id number] Date of Birth: 1950-06-12  Transition of [facility]) CM/SW Contact:    Ewing Schlein, LCSW Phone Number: 08/05/2019, 11:22 AM  Clinical Narrative: Patient is a 69 year old male who was admitted for vertigo, hyperglycemia due to type 2 diabetes mellitus, hypertension, restless legs syndrome, dizziness, paresthesia of skin, and asthma. PT evaluation recommended SNF. CSW met with patient in ED to discuss SNF. Patient reported he thinks his wife can assist with transfers and household distances and would prefer to discharge home with [facility] rather than go to SNF. Patient stated he would prefer [facility] as he has used the company several times. TOC to follow for discharge needs.  Expected Discharge Plan: Home w Home Health Services Barriers to Discharge: Continued Medical Work up  Patient Goals and CMS Choice Patient states their goals for this hospitalization and ongoing recovery are:: Discharge home with [facility] CMS Medicare.gov Compare Post Acute Care list provided to:: Patient Choice offered to / list presented to : Patient  Expected Discharge Plan and Services Expected Discharge Plan: Home w Home Health Services In-house Referral: Clinical Social Work Discharge Planning Services: NA Post Acute Care Choice: Home Health Living arrangements for the past 2 months: Single Family Home  Prior Living Arrangements/Services Living arrangements for the past 2 months: Single Family Home Lives with:: Spouse Patient language and need for interpreter reviewed:: Yes Do you feel safe going back to the place where you live?: Yes      Need for Family Participation in Patient Care: No (Comment) Care giver support system in place?: Yes (comment) Marquita Palms (spouse)) Criminal Activity/Legal Involvement Pertinent to Current Situation/Hospitalization: No - Comment as needed  Emotional  Assessment Appearance:: Appears stated age Attitude/Demeanor/Rapport: Engaged Affect (typically observed): Accepting Orientation: : Oriented to Self, Oriented to Place, Oriented to  Time, Oriented to Situation Alcohol / Substance Use: Not Applicable Psych Involvement: No (comment)  Admission diagnosis:  Vertigo [R42] Patient Active Problem List   Diagnosis Date Noted  . Paresthesia of skin 08/05/2019  . Asthma 08/05/2019  . Obesity (BMI 35.0-39.9 without comorbidity) 08/05/2019  . Vertigo 08/04/2019  . Dizziness 02/18/2019  . Type 2 diabetes mellitus with hypoglycemia without coma (HCC) 03/31/2017  . AKI (acute kidney injury) (HCC) 03/30/2017  . Right patellar tendon rupture 12/04/2014  . Mixed hyperlipidemia 11/25/2014  . Other specified hypothyroidism 11/25/2014  . S/P right TKA 03/23/2014  . S/P knee replacement 03/23/2014  . Spinal stenosis of cervicothoracic region 10/08/2013  . RLS (restless legs syndrome) 10/08/2013  . DM type 2 causing vascular disease (HCC) 10/08/2013  . Preoperative cardiovascular examination 08/14/2013  . Sleep apnea with use of continuous positive airway pressure (CPAP) 04/02/2013  . 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/02/2013  . Diabetic neuropathy (HCC) 07/23/2012  . Obesity, morbid (HCC) 05/01/2012  . Hypersomnia, persistent 05/01/2012  . OSA on CPAP 04/19/2012  . Chest pain 04/19/2012  . Essential hypertension, benign 12/16/2009  . CORONARY ATHEROSCLEROSIS NATIVE CORONARY ARTERY 12/16/2009  . Hyperglycemia due to type 2 diabetes mellitus (HCC) 11/30/2009  . Accelerating angina (HCC) 11/30/2009   PCP:  Annalee Genta, DO Pharmacy:   [facility] [address] [place] - [address] [place] [address] [place] [address] [place] [address] Phone: [phone] Fax: [phone]  [facility] [address], [place] - [address] [place] [address] Phone: [phone] Fax:  [phone]  Readmission Risk Interventions No flowsheet data found.

## 2019-08-05 NOTE — ED Notes (Signed)
Dr. Pincus Badder coming to speak with pt.

## 2019-08-05 NOTE — Progress Notes (Addendum)
Inpatient Diabetes Program Recommendations  AACE/ADA: New Consensus Statement on Inpatient Glycemic Control (2015)  Target Ranges:  Prepandial:   less than 140 mg/dL      Peak postprandial:   less than 180 mg/dL (1-2 hours)      Critically ill patients:  140 - 180 mg/dL   Lab Results  Component Value Date   GLUCAP 370 (H) 08/05/2019   HGBA1C 9.6 (A) 08/01/2019    Review of Glycemic Control Results for Paul Baker, Paul Baker (MRN [id number]) as of 08/05/2019 10:32  Ref. Range 08/05/2019 08:08  Glucose-Capillary Latest Ref Range: 70 - 99 mg/dL 370 (H)   Diabetes history: Type 2 DM Outpatient Diabetes medications: U-500 60-70 units TID, Metformin 500 mg BID, Glipizide 5 mg QAM Current orders for Inpatient glycemic control: Novolog 0-15 units TID, Novolog 0-5 units QHS  Inpatient Diabetes Program Recommendations:    Patient is followed by Dr [name], outpatient endocrinology with last appointment on 7/30.  Consider resuming portion of patient's home med- U-500 at 20 units TID. Attempted to call patient's cell, went to voicemail. Will plan to speak with patient once admitted to a bed.   Thanks, Bronson Curb, MSN, RNC-OB Diabetes Coordinator [phone] (8a-5p)

## 2019-08-05 NOTE — ED Notes (Signed)
Contacted pharmacy for the humulin and requip to be brought down.

## 2019-08-05 NOTE — ED Notes (Signed)
Dr. Tana Coast messaged me back saying why they wished to have pt. Stay. I educated pt. Why the Dr. Rosanna Randy them to stay.

## 2019-08-05 NOTE — Progress Notes (Signed)
*  PRELIMINARY RESULTS* Echocardiogram 2D Echocardiogram has been performed.  Leavy Cella 08/05/2019, 12:13 PM

## 2019-08-05 NOTE — ED Notes (Signed)
I contacted Dr. Pincus Badder about speaking with pt.

## 2019-08-05 NOTE — Evaluation (Addendum)
Physical Therapy Evaluation Patient Details Name: Paul Baker MRN: [id number] DOB: May 08, 1950 Today's Date: 08/05/2019   History of Present Illness  Paul Baker is a 69 y.o. male with medical history significant for coronary artery disease, hypertension, diabetes mellitus, restless leg syndrome and DVT who presented to the emergency department due to 1 day onset of dizziness.  He complained of intermittent dizziness with slight blurred vision yesterday, on waking up this morning, patient complained of right arm and right leg numbness and unsteady gait requiring holding onto things to help avoid falling.  Patient denies room spinning, but described the dizziness as unsteadiness, denies slurred speech.  He endorsed being admitted to this facility with similar episode in February of this year, during which he was told that he had an old stroke.  Patient denies chest pain, shortness of breath, headache, nausea, vomiting, abdominal pain or diarrhea.    Clinical Impression  The patient reported increased dizziness during supine to sit transfer, with bp at 169/81. The dizziness decreased with rest in sitting position but did not completely resolve. The patient demonstrated slow labored cadence, with frequent rest breaks requiring support from railing during ambulation with right sided hemiparesis signs noted. UE and LE lack of coordination noted. Patient was left in bed with call bell within arm's reach at bedside after therapy. PLAN: The patient will continue to benefit from skilled physical therapy services in hospital at recommended venue below in order to improve balance, gait, and ADL's to promote independence in functional activities.       Follow Up Recommendations SNF    Equipment Recommendations  None recommended by PT (pt owns necessary equipment already)    Recommendations for Other Services   None recommended by PT.     Precautions / Restrictions Precautions Precautions:  None Restrictions Weight Bearing Restrictions: No      Mobility  Bed Mobility Overal bed mobility: Modified Independent             General bed mobility comments: used bed railing, slow labored movements  Transfers Overall transfer level: Modified independent Equipment used: Rolling walker (2 wheeled)             General transfer comment:  (dizziness w supine to sit transfer)  Ambulation/Gait Ambulation/Gait assistance: Min guard;Min assist Gait Distance (Feet): 25 Feet Assistive device: Rolling walker (2 wheeled) Gait Pattern/deviations: Step-through pattern;Decreased step length - right;Decreased dorsiflexion - right;Decreased weight shift to left Gait velocity:  (slow labored cadance) Gait velocity interpretation:  (limited household ambulator at increased risk for falls) General Gait Details:  (dragging of R foot during gait)  Stairs            Wheelchair Mobility    Modified Rankin (Stroke Patients Only)       Balance Overall balance assessment: Modified Independent                                           Pertinent Vitals/Pain Pain Assessment: No/denies pain    Home Living Family/patient expects to be discharged to:: Skilled nursing facility                      Prior Function Level of Independence: Independent               Hand Dominance   Dominant Hand: Right (ambidextrous)    Extremity/Trunk Assessment  Lower Extremity Assessment Lower Extremity Assessment: Generalized weakness;RLE deficits/detail RLE Deficits / Details:  (decreased strength, sensation, and coordination in RLE) RLE Sensation: decreased light touch RLE Coordination: decreased gross motor    Cervical / Trunk Assessment Cervical / Trunk Assessment: Normal  Communication   Communication: No difficulties  Cognition Arousal/Alertness: Awake/alert Behavior During Therapy: WFL for tasks assessed/performed Overall Cognitive  Status: Within Functional Limits for tasks assessed                                        General Comments      Exercises     Assessment/Plan    PT Assessment Patient needs continued PT services  PT Problem List Decreased strength;Decreased activity tolerance;Decreased balance;Decreased mobility;Decreased coordination;Decreased safety awareness;Impaired sensation;Obesity       PT Treatment Interventions DME instruction;Gait training;Functional mobility training;Therapeutic activities;Therapeutic exercise;Balance training;Patient/family education;Modalities    PT Goals (Current goals can be found in the Care Plan section)  Acute Rehab PT Goals Patient Stated Goal:  (SNF) PT Goal Formulation: With patient Time For Goal Achievement: 08/19/19 Potential to Achieve Goals: Good    Frequency Min 3X/week   Barriers to discharge        Co-evaluation               AM-PAC PT "6 Clicks" Mobility  Outcome Measure Help needed turning from your back to your side while in a flat bed without using bedrails?: A Little Help needed moving from lying on your back to sitting on the side of a flat bed without using bedrails?: A Little Help needed moving to and from a bed to a chair (including a wheelchair)?: A Little Help needed standing up from a chair using your arms (e.g., wheelchair or bedside chair)?: A Little Help needed to walk in hospital room?: A Little Help needed climbing 3-5 steps with a railing? : A Lot 6 Click Score: 17    End of Session Equipment Utilized During Treatment: Gait belt Activity Tolerance: Patient limited by fatigue (pt reported feeling like RLE was going to "buckle") Patient left: in bed;with call bell/phone within reach   PT Visit Diagnosis: Unsteadiness on feet (R26.81);Other abnormalities of gait and mobility (R26.89);Muscle weakness (generalized) (M62.81);Hemiplegia and hemiparesis Hemiplegia - Right/Left: Right Hemiplegia -  dominant/non-dominant: Dominant Hemiplegia - caused by: Cerebral infarction;Other cerebrovascular disease    Time: 0923-0953 PT Time Calculation (min) (ACUTE ONLY): 30 min   Charges:   PT Evaluation $PT Eval Moderate Complexity: 1 Mod PT Treatments $Therapeutic Activity: 23-37 mins        11:27 AM , 08/05/19 Karlyn Agee, SPT Physical Therapy with [facility] [phone] office  During this treatment session, the therapist was present, participating in and directing the treatment.  11:27 AM, 08/05/19 Lonell Grandchild, MPT Physical Therapist with [facility] 336 [phone] office [phone] mobile phone

## 2019-08-05 NOTE — ED Notes (Signed)
Messaged Dr. Tana Coast if I could have have her number so that Dr. Pincus Badder could contact them since pt. Wants to leave AMA.

## 2019-08-05 NOTE — ED Notes (Signed)
After speaking with pt. Pt. Requested to speak to the doctor. Notified Dr. Tana Coast that pt. Would like to speak with them.

## 2019-08-05 NOTE — ED Notes (Signed)
Dr. Tana Coast messaged back stating that they called and left Dr. Pincus Badder a voicemail.

## 2019-08-05 NOTE — ED Notes (Signed)
Contacted Dr. Tana Coast about pt. Wishing to go home.

## 2019-08-05 NOTE — Discharge Summary (Signed)
Physician Discharge Summary   Patient ID: LASH MATULICH MRN: [id number] DOB/AGE: 1950-09-29 69 y.o.  Admit date: 08/04/2019 Discharge date: 08/05/2019  Primary Care Physician:  Erven Colla, DO   Recommendations for Outpatient Follow-up:  1. Follow up with PCP in 1-2 weeks 2. Patient was recommended SNF, however was adamant to go home. Would recommend for PCP to arrange outpatient rehab if he agrees.   Home Health: Patient has cane and walker at home  Equipment/Devices:   Discharge Condition: stable  CODE STATUS: FULL   Diet recommendation: carb modified diet    Discharge Diagnoses:    Right sided weakness with dizziness, likely TIA  . RLS (restless legs syndrome) . Essential hypertension, benign Diabetes mellitis IDDM, type 2, uncontrolled with hyperglycemia  OSA Asthma  Obesity   Consults:  Neurology, Dr Merlene Laughter     Allergies:   Allergies  Allergen Reactions  . Tape Rash    Pulls off skin  . Cardizem [Diltiazem Hcl]     Edema   . Cardura [Doxazosin Mesylate]     Headaches / cramps  . Lipitor [Atorvastatin] Other (See Comments)    Leg cramps  . Paxil [Paroxetine Hcl]     Unknown reaction   . Codeine Rash and Other (See Comments)    Headache   . Gabapentin Rash     DISCHARGE MEDICATIONS: Allergies as of 08/05/2019      Reactions   Tape Rash   Pulls off skin   Cardizem [diltiazem Hcl]    Edema    Cardura [doxazosin Mesylate]    Headaches / cramps   Lipitor [atorvastatin] Other (See Comments)   Leg cramps   Paxil [paroxetine Hcl]    Unknown reaction    Codeine Rash, Other (See Comments)   Headache    Gabapentin Rash      Medication List    TAKE these medications   albuterol 108 (90 Base) MCG/ACT inhaler Commonly known as: VENTOLIN HFA Inhale 2 puffs into the lungs every 6 (six) hours as needed for wheezing or shortness of breath.   alfuzosin 10 MG 24 hr tablet Commonly known as: UROXATRAL Take 10 mg by mouth daily with breakfast.    amLODipine 5 MG tablet Commonly known as: NORVASC TAKE 1 TABLET BY MOUTH ONCE DAILY **DOSE  INCREASE  05/21/2018** What changed: See the new instructions.   aspirin EC 81 MG tablet Take 1 tablet (81 mg total) by mouth daily for 14 days. Swallow whole. Please continue Aspirin 81mg  daily with plavix for 2 weeks, then continue plavix indefinitely.   B-D ULTRAFINE III SHORT PEN 31G X 8 MM Misc Generic drug: Insulin Pen Needle USING FIVE TIMES DAILY.   Breo Ellipta 200-25 MCG/INH Aepb Generic drug: fluticasone furoate-vilanterol Inhale 1 puff into the lungs daily.   clopidogrel 75 MG tablet Commonly known as: PLAVIX Take 1 tablet (75 mg total) by mouth daily.   Dexcom G6 Receiver Devi 1 Piece by Does not apply route as needed.   Dexcom G6 Sensor Misc 4 Pieces by Does not apply route once a week.   furosemide 40 MG tablet Commonly known as: LASIX Take 40 mg by mouth. Take 40 mg alternating with 80 mg Daily   glipiZIDE 5 MG 24 hr tablet Commonly known as: GLUCOTROL XL Take 1 tablet (5 mg total) by mouth daily with breakfast.   HumuLIN R U-500 KwikPen 500 UNIT/ML kwikpen Generic drug: insulin regular human CONCENTRATED Inject 60-70 Units into the skin 3 (three) times daily  with meals. Per sliding scale.   isosorbide mononitrate 60 MG 24 hr tablet Commonly known as: IMDUR TAKE 1 TABLET BY MOUTH IN THE MORNING AND 1/2 (ONE-HALF) IN THE EVENING   loratadine 10 MG tablet Commonly known as: CLARITIN Take 10 mg by mouth daily as needed for allergies.   losartan 50 MG tablet Commonly known as: COZAAR Take 1 tablet (50 mg total) by mouth daily.   metFORMIN 500 MG tablet Commonly known as: GLUCOPHAGE TAKE 1 TABLET BY MOUTH TWICE DAILY WITH MEALS   metoprolol succinate 50 MG 24 hr tablet Commonly known as: TOPROL-XL Take 1 tablet by mouth twice daily   nitroGLYCERIN 0.4 MG SL tablet Commonly known as: NITROSTAT DISSOLVE 1 TABLET UNDER TONGUE EVERY 5 MINUTES UP TO 15 MIN FOR  CHESTPAIN. IF NO RELIEF CALL 911.   ONE TOUCH ULTRA TEST test strip Generic drug: glucose blood TEST BLOOD SUGAR UP TO 4 TIMES DAILY.   OneTouch Delica Lancets 97Q Misc USE AS DIRECTED TO TEST BLOOD SUGAR 4 TIMES DAILY.   pravastatin 40 MG tablet Commonly known as: PRAVACHOL Take 1 tablet (40 mg total) by mouth every evening.   rOPINIRole 4 MG tablet Commonly known as: REQUIP Take 1/2 (one-half) tablet by mouth twice daily What changed: See the new instructions.   sodium chloride 0.65 % Soln nasal spray Commonly known as: OCEAN Place 1-2 sprays into both nostrils daily as needed for congestion.   Vitamin D (Ergocalciferol) 1.25 MG (50000 UNIT) Caps capsule Commonly known as: DRISDOL Take 1 capsule by mouth once a week        Brief H and P: For complete details please refer to admission H and P, but in brief *Patient is a 69 year old male with history of CAD, hypertension, diabetes mellitus, type II, IDDM, restless leg syndrome, presented to ED with dizziness, slightly blurred vision on waking up on the day of admission.  Patient also reported similar episode in 02/2019 when he was admitted.  He also reported unsteadiness on walking, he felt weakness in his legs right worse than left with right-sided hand and arm numbness and paresthesias. In ED, he was mildly tachypneic, otherwise stable. BUN 26, blood glucose of 302.  MRI head without contrast showed no acute infarct, showed mild chronic ischemic changes on the left.  Urine drug screen was negative, UA negative for UTI, alcohol level less than 10, troponin x1 -.   Hospital Course:   Right sided weakness, paresthesias, dizziness -Per patient, he has not ambulated yet, right-sided weakness is improving however still feels right hand and arm numbness. -MRI of the brain showed no acute infarct but showed mild chronic ischemic changes on the left -Telemetry neurology was consulted, recommended repeating MRI brain and MR angiogram  of head and neck after 24 to 48 hours to look for evidence of evolving ischemia -Neurology was consulted, discussed with Dr. Merlene Laughter -2D echo  showed EF of 55 to 60% -Risk factors include uncontrolled diabetes, hypertension, hyperlipidemia -Hemoglobin A1c 9.6, fasting CBG 336 this morning, LDL 102, goal less than 70 -Continue aspirin and Plavix for 2 weeks, then plavix indefinitely. Continue pravastatin -PT evaluation recommended skilled nursing facility, social work consult placed however patient declined SNF and adamantly requested discharge home.  - MRA head and neck showed no large vessel occlusion or hemodynamically significant stenosis.  - Patient was seen by Dr Merlene Laughter and cleared for discharge     type 2 diabetes mellitus ([place]), IDDM, uncontrolled with hyperglycemia -A1c 9.6, CBGs uncontrolled  in 300s -continue insulin regimen at home, outpatient follow-up with PCP     Essential hypertension, benign - continue BP control with amlodipine, BB, lasix, losartan     OSA on CPAP    RLS (restless legs syndrome) -Continue Requip    Asthma -Currently stable, no wheezing, continue home bronchodilators    Obesity (BMI 35.0-39.9 without comorbidity) Estimated body mass index is 35.59 kg/m as calculated from the following:   Height as of this encounter: 5\' 5"  (1.651 m).   Weight as of this encounter: 97 kg.    Day of Discharge S: adamantly wants to go home, feeling close to his baseline   BP (!) 154/77 (BP Location: Left Arm)   Pulse 72   Temp 98 F (36.7 C) (Oral)   Resp 10   Ht 5\' 5"  (1.651 m)   Wt 97 kg   SpO2 98%   BMI 35.59 kg/m   Physical Exam: General: Alert and awake oriented x3 not in any acute distress. HEENT: anicteric sclera, pupils reactive to light and accommodation CVS: S1-S2 clear no murmur rubs or gallops Chest: clear to auscultation bilaterally, no wheezing rales or rhonchi Abdomen: soft nontender, nondistended, normal bowel  sounds Extremities: no cyanosis, clubbing or edema noted bilaterally     Get Medicines reviewed and adjusted: Please take all your medications with you for your next visit with your Primary MD  Please request your Primary MD to go over all hospital tests and procedure/radiological results at the follow up. Please ask your Primary MD to get all Hospital records sent to his/her office.  If you experience worsening of your admission symptoms, develop shortness of breath, life threatening emergency, suicidal or homicidal thoughts you must seek medical attention immediately by calling 911 or calling your MD immediately  if symptoms less severe.  You must read complete instructions/literature along with all the possible adverse reactions/side effects for all the Medicines you take and that have been prescribed to you. Take any new Medicines after you have completely understood and accept all the possible adverse reactions/side effects.   Do not drive when taking pain medications.   Do not take more than prescribed Pain, Sleep and Anxiety Medications  Special Instructions: If you have smoked or chewed Tobacco  in the last 2 yrs please stop smoking, stop any regular Alcohol  and or any Recreational drug use.  Wear Seat belts while driving.  Please note  You were cared for by a hospitalist during your hospital stay. Once you are discharged, your primary care physician will handle any further medical issues. Please note that NO REFILLS for any discharge medications will be authorized once you are discharged, as it is imperative that you return to your primary care physician (or establish a relationship with a primary care physician if you do not have one) for your aftercare needs so that they can reassess your need for medications and monitor your lab values.   The results of significant diagnostics from this hospitalization (including imaging, microbiology, ancillary and laboratory) are listed  below for reference.      Procedures/Studies:  MR ANGIO HEAD WO CONTRAST  Result Date: 08/05/2019 CLINICAL DATA:  Altered mental status EXAM: MRA NECK WITHOUT AND WITH CONTRAST MRA HEAD WITHOUT CONTRAST TECHNIQUE: Multiplanar and multiecho pulse sequences of the neck were obtained without and with intravenous contrast. Angiographic images of the neck were obtained using MRA technique without and with intravenous contast.; Angiographic images of the Circle of Willis were obtained using MRA  technique without intravenous contrast. CONTRAST:  67mL GADAVIST GADOBUTROL 1 MMOL/ML IV SOLN COMPARISON:  None. FINDINGS: MRA NECK Motion artifact is present. Great vessel origins are patent. Common, internal, and external carotid arteries are patent. There is likely atherosclerotic irregularity at the right greater than left ICA origins without hemodynamically significant stenosis. MRA HEAD Motion artifact is present. Intracranial internal carotid arteries are patent. Middle and anterior cerebral arteries are patent. Intracranial vertebral arteries, basilar artery, posterior cerebral arteries are patent. There is no significant stenosis or aneurysm. IMPRESSION: Degraded by motion. No large vessel occlusion or hemodynamically significant stenosis. Electronically Signed   By: Macy Mis M.D.   On: 08/05/2019 14:00   MR ANGIO NECK W WO CONTRAST  Result Date: 08/05/2019 CLINICAL DATA:  Altered mental status EXAM: MRA NECK WITHOUT AND WITH CONTRAST MRA HEAD WITHOUT CONTRAST TECHNIQUE: Multiplanar and multiecho pulse sequences of the neck were obtained without and with intravenous contrast. Angiographic images of the neck were obtained using MRA technique without and with intravenous contast.; Angiographic images of the Circle of Willis were obtained using MRA technique without intravenous contrast. CONTRAST:  20mL GADAVIST GADOBUTROL 1 MMOL/ML IV SOLN COMPARISON:  None. FINDINGS: MRA NECK Motion artifact is present.  Great vessel origins are patent. Common, internal, and external carotid arteries are patent. There is likely atherosclerotic irregularity at the right greater than left ICA origins without hemodynamically significant stenosis. MRA HEAD Motion artifact is present. Intracranial internal carotid arteries are patent. Middle and anterior cerebral arteries are patent. Intracranial vertebral arteries, basilar artery, posterior cerebral arteries are patent. There is no significant stenosis or aneurysm. IMPRESSION: Degraded by motion. No large vessel occlusion or hemodynamically significant stenosis. Electronically Signed   By: Macy Mis M.D.   On: 08/05/2019 14:00   MR BRAIN WO CONTRAST  Result Date: 08/04/2019 CLINICAL DATA:  Acute neuro deficit. Dizziness ataxia right-sided numbness. Headache 2 days EXAM: MRI HEAD WITHOUT CONTRAST TECHNIQUE: Multiplanar, multiecho pulse sequences of the brain and surrounding structures were obtained without intravenous contrast. COMPARISON:  MRI head 02/19/2019 FINDINGS: Brain: Negative for acute infarct Mild chronic ischemic change. Small chronic infarct in the left frontal cortex. Small chronic white matter infarcts left frontal parietal white matter. Negative for hemorrhage or mass. Ventricle size normal. Vascular: Normal arterial flow voids. Skull and upper cervical spine: No focal skeletal lesion. Sinuses/Orbits: Mild mucosal edema paranasal sinuses. Bilateral cataract extraction. Other: None IMPRESSION: Negative for acute infarct. Mild chronic ischemic changes on the left. Electronically Signed   By: Franchot Gallo M.D.   On: 08/04/2019 19:20   ECHOCARDIOGRAM COMPLETE  Result Date: 08/05/2019    ECHOCARDIOGRAM REPORT   Patient Name:   KENDEL BESSEY Date of Exam: 08/05/2019 Medical Rec #:  [id number]          Height:       65.0 in Accession #:    [id number]         Weight:       213.8 lb Date of Birth:  Mar 09, 1950          BSA:          2.035 m Patient Age:    83 years            BP:           154/77 mmHg Patient Gender: M                  HR:           71 bpm.  Exam Location:  [facility] Procedure: 2D Echo Indications:    Stroke 434.91 / I163.9  History:        Patient has prior history of Echocardiogram examinations, most                 recent 02/19/2019. Risk Factors:Former Smoker, Dyslipidemia,                 Diabetes and Hypertension. CORONARY ATHEROSCLEROSIS NATIVE                 CORONARY ARTERY.  Sonographer:    Leavy Cella RDCS (AE) Referring Phys: [id number] OLADAPO ADEFESO IMPRESSIONS  1. Left ventricular ejection fraction, by estimation, is 55 to 60%. The left ventricle has normal function. The left ventricle has no regional wall motion abnormalities. Left ventricular diastolic parameters were normal.  2. Right ventricular systolic function is normal. The right ventricular size is normal. Tricuspid regurgitation signal is inadequate for assessing PA pressure.  3. Left atrial size was upper normal.  4. The mitral valve is grossly normal. Mild mitral valve regurgitation.  5. The aortic valve is tricuspid. Aortic valve regurgitation is not visualized.  6. The inferior vena cava is normal in size with greater than 50% respiratory variability, suggesting right atrial pressure of 3 mmHg. FINDINGS  Left Ventricle: Left ventricular ejection fraction, by estimation, is 55 to 60%. The left ventricle has normal function. The left ventricle has no regional wall motion abnormalities. The left ventricular internal cavity size was normal in size. There is  no left ventricular hypertrophy. Left ventricular diastolic parameters were normal. Right Ventricle: The right ventricular size is normal. No increase in right ventricular wall thickness. Right ventricular systolic function is normal. Tricuspid regurgitation signal is inadequate for assessing PA pressure. Left Atrium: Left atrial size was upper normal. Right Atrium: Right atrial size was normal in size. Pericardium: There is no  evidence of pericardial effusion. Mitral Valve: The mitral valve is grossly normal. Mild mitral annular calcification. Mild mitral valve regurgitation. Tricuspid Valve: The tricuspid valve is grossly normal. Tricuspid valve regurgitation is trivial. Aortic Valve: The aortic valve is tricuspid. Aortic valve regurgitation is not visualized. Moderate aortic valve annular calcification. Pulmonic Valve: The pulmonic valve was grossly normal. Pulmonic valve regurgitation is trivial. Aorta: The aortic root is normal in size and structure. Venous: The inferior vena cava is normal in size with greater than 50% respiratory variability, suggesting right atrial pressure of 3 mmHg. IAS/Shunts: No atrial level shunt detected by color flow Doppler.  LEFT VENTRICLE PLAX 2D LVIDd:         5.40 cm  Diastology LVIDs:         3.29 cm  LV e' lateral:   8.05 cm/s LV PW:         1.09 cm  LV E/e' lateral: 13.0 LV IVS:        0.90 cm  LV e' medial:    6.85 cm/s LVOT diam:     1.80 cm  LV E/e' medial:  15.3 LVOT Area:     2.54 cm  RIGHT VENTRICLE RV S prime:     13.20 cm/s TAPSE (M-mode): 2.4 cm LEFT ATRIUM             Index       RIGHT ATRIUM           Index LA diam:        4.20 cm 2.06 cm/m  RA Area:     12.90 cm  LA Vol (A2C):   59.6 ml 29.28 ml/m RA Volume:   31.10 ml  15.28 ml/m LA Vol (A4C):   65.2 ml 32.04 ml/m LA Biplane Vol: 64.3 ml 31.59 ml/m   AORTA Ao Root diam: 2.90 cm MITRAL VALVE MV Area (PHT): 4.24 cm     SHUNTS MV Decel Time: 179 msec     Systemic Diam: 1.80 cm MV E velocity: 105.00 cm/s MV A velocity: 116.00 cm/s MV E/A ratio:  0.91 [name] MD Electronically signed by [name] MD Signature Date/Time: 08/05/2019/5:18:06 PM    Final        LAB RESULTS: Basic Metabolic Panel: Recent Labs  Lab 08/04/19 1722 08/05/19 0334  NA 132* 135  K 3.8 4.2  CL 101 102  CO2 24 25  GLUCOSE 302* 336*  BUN 26* 22  CREATININE 1.03 0.94  CALCIUM 9.1 9.0  MG  --  1.9  PHOS  --  3.4   Liver Function  Tests: Recent Labs  Lab 08/04/19 1722 08/05/19 0334  AST 17 15  ALT 20 18  ALKPHOS 63 52  BILITOT 0.2* 0.4  PROT 7.6 6.9  ALBUMIN 4.5 3.8   No results for input(s): LIPASE, AMYLASE in the last 168 hours. No results for input(s): AMMONIA in the last 168 hours. CBC: Recent Labs  Lab 08/04/19 1722 08/04/19 1722 08/05/19 0334  WBC 9.6  --  8.5  HGB 12.9*  --  12.5*  HCT 40.3  --  39.5  MCV 92.4   < > 91.9  PLT 275  --  241   < > = values in this interval not displayed.   Cardiac Enzymes: No results for input(s): CKTOTAL, CKMB, CKMBINDEX, TROPONINI in the last 168 hours. BNP: Invalid input(s): POCBNP CBG: Recent Labs  Lab 08/05/19 1213 08/05/19 1654  GLUCAP 395* 383*       Disposition and Follow-up: Discharge Instructions    Diet Carb Modified   Complete by: As directed    Increase activity slowly   Complete by: As directed        DISPOSITION: home    [place]    Phillips Odor, MD. Schedule an appointment as soon as possible for a visit in 4 week(s).   Specialty: Neurology Contact information: [address] [address] [place], [place], DO. Schedule an appointment as soon as possible for a visit in 2 week(s).   Specialty: Family Medicine Contact information: [place] [address] [phone]        Satira Sark, MD .   Specialty: Cardiology Contact information: [place] [address] [phone]                Time coordinating discharge:  30mins   Signed:   Estill Cotta M.D. [facility] 08/05/2019, 8:00 PM

## 2019-08-05 NOTE — ED Notes (Signed)
Dr. Pincus Badder sent message back asking for me to have Dr. Tana Coast call him.

## 2019-08-05 NOTE — Consult Note (Signed)
[place] A. Paul Laughter, MD     [email or web address]          Paul Baker is an 69 y.o. male.   ASSESSMENT/PLAN: 1.  Recurrent disequilibrium and mild right-sided weakness in the setting of hyperglycemia with negative imaging.  While this could be a TIA, given that the patient has has this recurrently with negative imaging, it is most likely due to hyperglycemia.  Dual antiplatelet agents are recommended however for 2 weeks.  Afterwards, the patient can revert back to Plavix monotherapy.  Continue with other risk factor modifications.  The patient could benefit from outpatient rehab/PT. 2.  Obstructive sleep apnea syndrome on CPAP. 3.  Restless leg syndrome on appropriate medication consider checking ferritin in the outpatient setting. 4.  Obesity 5.  Inadequately controlled diabetes mellitus. 6.  Hypertension    This is a 69 year old white male who presents with the acute onset of dizziness described as disequilibrium sensation.  He does not report a spinning-like sensation.  The history obtained from the patient and his wife who is at the bedside.  They report that he has had recurrent episodes of dizziness and other neurological symptoms typically and almost always associated with hyperglycemia.  The work-up has contemporaneously been negative for acute ischemic stroke during all of these events.  There however appears to be a remote infarct seen on imaging.  The initial event occurred 3 years ago when his glucose was 500.  He was evaluated in the emergency room here in February of this year and again his blood sugars were in the high 300s range.  He represented with similar symptoms at that time and again presented yesterday with a disequilibrium.  He was noted to have mild right-sided weakness during evaluation although the patient himself does not specifically report focal numbness or weakness.  There are no reports of diplopia, dysarthria or dysphagia.  Patient does  not report headaches, chest pain, dyspnea or palpitation.  The patient does have severe insomnia and restless leg syndrome.  He apparently is on CPAP machine.  The patient was seen by physical therapy in the hospital.  She recommended inpatient rehab by the patient wants to go home.  He has been in the emergency room for about 24 hours and it did not sleep last night due to the noisiness and disruption to the emergency room.  The family is also concerned that he could be exposed to Covid.  The patient does report that he has a cane and walker at home and wants to go home and rehab by himself.  He is willing to possibly try physical therapy at home but he is quite adamant that he wants to leave.     GENERAL: This is a pleasant obese male who is in some discomfort but no acute distress.  HEENT: He has a large neck and crowded airway.  Neck is supple.  No trauma noted.  ABDOMEN: soft  EXTREMITIES: No edema   BACK: No  SKIN: Normal by inspection.    MENTAL STATUS: Alert and oriented. Speech, language and cognition are generally intact. Judgment and insight normal.   CRANIAL NERVES: Pupils are equal, round and reactive to light and accomodation; extra ocular movements are full, there is no significant nystagmus; visual fields are full; upper and lower facial muscles are normal in strength and symmetric, there is no flattening of the nasolabial folds; tongue is midline; uvula is midline; shoulder elevation is normal.  MOTOR: Normal tone, bulk and strength; there  is mild drift of the right upper extremity and right leg.  COORDINATION: Left finger to nose is normal, right finger to nose is normal, No rest tremor; no intention tremor; no postural tremor; no bradykinesia.  REFLEXES: Deep tendon reflexes are symmetrical and normal.   SENSATION: Normal to light touch, temperature, and pain.  GAIT: Patient stands and ambulates without assistance but gait is moderately unsteady and slow.    NIH  stroke scale 2.   DC SUMM 02-2019 1)Dizziness and left-sided weakness/MRI evidence of old stroke non-hemorrhagic --- symptoms mostly resolved, patient continues to have mild vertiginous symptoms, Dix-Hallpike maneuver is positive--- -carotid artery Dopplers without hemodynamically significant stenosis -MRI brain with small focus of encephalomalacia within the mid left frontal lobe but no evidence of acute intracranial abnormality specifically no acute infarction -MRA head without LVO or proximal high-grade arterial stenosis, no intracranial aneurysm or other acute finding -May use meclizine as needed for vertigo  -consider Epley maneuvers with occupational therapist as outpatient  -Echo with EF of 55 to 60%, with grade 1 diastolic dysfunction, no regional wall motion normalities -Given history of CAD and prior stroke LDL goal should be 70 -Aspirin as prescribed  2) Atypical chest discomfort in a patient with history of CAD--- chest pain-free, ruled out for ACS by cardiac enzymes and EKG -s/p LHC in January 2020 without intervention -LDL is 129, increase pravastatin to 40 mg daily  3)DM2--- A1c 9.2, increase Metformin to 1000 twice daily, follow-up with PCP for further adjustments of diabetic medications  4)HTN--BP is not at goal, increase losartan to 50 mg daily      Blood pressure (!) 154/77, pulse 72, temperature 98 F (36.7 C), temperature source Oral, resp. rate 10, height 5\' 5"  (1.651 m), weight 97 kg, SpO2 98 %.  Past Medical History:  Diagnosis Date  . Arthritis   . Asthma   . Colon polyp   . Coronary atherosclerosis of native coronary artery    a. 2011: cath showing 90% stenosis along small non-dominant RCA (too small for PCI). b. 01/2018: cath showing nonobstructive CAD with 60 to 70% proximal to mid nondominant RCA stenosis, 50% mid LAD and 40 to 50% OM1  . DVT (deep venous thrombosis) ([place]) 2005   Right arm  . Essential hypertension, benign   . Headache   .  History of transfusion   . Hypersomnia    CPAP of 16 cm, diagnosed with AHI of 60 in 2012,epworth 21- narcolepsy?  [name] Hypothyroidism   . Mixed hyperlipidemia   . Morbid obesity ([place])   . MRSA (methicillin resistant staph aureus) culture positive    08/2012  . Narcolepsy   . OSA (obstructive sleep apnea)    CPAP  . Osteoarthritis   . Pneumonia   . Psoriasis   . Pulmonary embolism ([place]) 2004  . RLS (restless legs syndrome)   . Rotator cuff disorder    Left  . Septic arthritis of knee, left ([place])   . Skin cancer, basal cell   . Type 2 diabetes mellitus ([place])     Past Surgical History:  Procedure Laterality Date  . BACK SURGERY    . CATARACT EXTRACTION Bilateral   . COLONOSCOPY    . CYST REMOVAL TRUNK     from back  . EYE SURGERY Left 2016   laser to left eye  . HIP SURGERY     bone removed from both sides of hip  . KNEE ARTHROTOMY Right 12/04/2014   Procedure: KNEE ARTHROTOMY PATELLA  LIGAMENT RECONSTRUSION AND REPAIR RIGHT KNEE;  Surgeon: Paralee Cancel, MD;  Location: [facility];  Service: Orthopedics;  Laterality: Right;  . KNEE SURGERY     X 25 TIMES  . LEFT HEART CATH AND CORONARY ANGIOGRAPHY N/A 01/17/2018   Procedure: LEFT HEART CATH AND CORONARY ANGIOGRAPHY;  Surgeon: Belva Crome, MD;  Location: [facility];  Service: Cardiovascular;  Laterality: N/A;  . LUMBAR DISC SURGERY     Left L3, L4, L5 discecotomy with decompression of L4 root  . TONSILLECTOMY    . TOTAL KNEE ARTHROPLASTY  2003   LEFT  . TOTAL KNEE ARTHROPLASTY Right 03/23/2014   Procedure: RIGHT TOTAL KNEE ARTHROPLASTY AND REMOVAL RIGHT TIBIAL  DEEP IMPLANT STAPLE;  Surgeon: Paralee Cancel, MD;  Location: [facility];  Service: Orthopedics;  Laterality: Right;  . TOTAL KNEE REVISION  2005   LEFT  . WRIST SURGERY      Family History  Problem Relation Age of Onset  . Hypertension Father   . Heart attack Father   . Kidney Stones Father   . Seizures Grandchild   . Narcolepsy Grandchild   . Diabetes Sister       Social History:  reports that he quit smoking about 24 years ago. His smoking use included cigarettes. He started smoking about 52 years ago. He has a 15.00 pack-year smoking history. He has never used smokeless tobacco. He reports that he does not drink alcohol and does not use drugs.  Allergies:  Allergies  Allergen Reactions  . Tape Rash    Pulls off skin  . Cardizem [Diltiazem Hcl]     Edema   . Cardura [Doxazosin Mesylate]     Headaches / cramps  . Lipitor [Atorvastatin] Other (See Comments)    Leg cramps  . Paxil [Paroxetine Hcl]     Unknown reaction   . Codeine Rash and Other (See Comments)    Headache   . Gabapentin Rash    Medications: Prior to Admission medications   Medication Sig Start Date End Date Taking? Authorizing Provider  albuterol (VENTOLIN HFA) 108 (90 Base) MCG/ACT inhaler Inhale 2 puffs into the lungs every 6 (six) hours as needed for wheezing or shortness of breath. 12/12/18  Yes [name], MD  alfuzosin (UROXATRAL) 10 MG 24 hr tablet Take 10 mg by mouth daily with breakfast.   Yes [provider]  amLODipine (NORVASC) 5 MG tablet TAKE 1 TABLET BY MOUTH ONCE DAILY **DOSE  INCREASE  05/21/2018** Patient taking differently: Take 5 mg by mouth daily.  06/30/19  Yes Satira Sark, MD  B-D ULTRAFINE III SHORT PEN 31G X 8 MM MISC USING FIVE TIMES DAILY. 11/06/16  Yes Cassandria Anger, MD  clopidogrel (PLAVIX) 75 MG tablet Take 1 tablet (75 mg total) by mouth daily. 06/03/19  Yes Lovena Le, Malena M, DO  Continuous Blood Gluc Receiver (DEXCOM G6 RECEIVER) DEVI 1 Piece by Does not apply route as needed. 04/13/18  Yes Nida, Marella Chimes, MD  Continuous Blood Gluc Sensor (DEXCOM G6 SENSOR) MISC 4 Pieces by Does not apply route once a week. 05/15/18  Yes Nida, Marella Chimes, MD  fluticasone furoate-vilanterol (BREO ELLIPTA) 200-25 MCG/INH AEPB Inhale 1 puff into the lungs daily. 01/13/19  Yes Chesley Mires, MD  furosemide (LASIX) 40 MG tablet  Take 40 mg by mouth. Take 40 mg alternating with 80 mg Daily   Yes [provider]  glipiZIDE (GLUCOTROL XL) 5 MG 24 hr tablet Take 1 tablet (5  mg total) by mouth daily with breakfast. 08/01/19  Yes Nida, Marella Chimes, MD  insulin regular human CONCENTRATED (HUMULIN R U-500 KWIKPEN) 500 UNIT/ML kwikpen Inject 60-70 Units into the skin 3 (three) times daily with meals. Per sliding scale. 08/01/19  Yes Nida, Marella Chimes, MD  isosorbide mononitrate (IMDUR) 60 MG 24 hr tablet TAKE 1 TABLET BY MOUTH IN THE MORNING AND 1/2 (ONE-HALF) IN THE EVENING 06/30/19  Yes Satira Sark, MD  loratadine (CLARITIN) 10 MG tablet Take 10 mg by mouth daily as needed for allergies.    Yes [provider]  losartan (COZAAR) 50 MG tablet Take 1 tablet (50 mg total) by mouth daily. 07/21/19  Yes Lovena Le, Malena M, DO  metFORMIN (GLUCOPHAGE) 500 MG tablet TAKE 1 TABLET BY MOUTH TWICE DAILY WITH MEALS 06/30/19  Yes Nida, Marella Chimes, MD  metoprolol succinate (TOPROL-XL) 50 MG 24 hr tablet Take 1 tablet by mouth twice daily 07/15/18  Yes Satira Sark, MD  nitroGLYCERIN (NITROSTAT) 0.4 MG SL tablet DISSOLVE 1 TABLET UNDER TONGUE EVERY 5 MINUTES UP TO 15 MIN FOR CHESTPAIN. IF NO RELIEF CALL 911. 11/08/17  Yes Satira Sark, MD  ONE TOUCH ULTRA TEST test strip TEST BLOOD SUGAR UP TO 4 TIMES DAILY. 10/10/16  Yes Nida, Marella Chimes, MD  ONETOUCH DELICA LANCETS 64P MISC USE AS DIRECTED TO TEST BLOOD SUGAR 4 TIMES DAILY. 10/10/16  Yes Cassandria Anger, MD  pravastatin (PRAVACHOL) 40 MG tablet Take 1 tablet (40 mg total) by mouth every evening. 02/25/19  Yes [name], MD  rOPINIRole (REQUIP) 4 MG tablet Take 1/2 (one-half) tablet by mouth twice daily Patient taking differently: Take 2 mg by mouth 2 (two) times daily.  08/04/19  Yes Lomax, Amy, NP  sodium chloride (OCEAN) 0.65 % SOLN nasal spray Place 1-2 sprays into both nostrils daily as needed for congestion.   Yes [provider]  Vitamin D, Ergocalciferol, (DRISDOL) 1.25 MG (50000 UNIT) CAPS capsule Take 1 capsule by mouth once a week 07/28/19  Yes Nida, Marella Chimes, MD    Scheduled Meds: . aspirin EC  81 mg Oral Daily  . clopidogrel  75 mg Oral Daily  . fluticasone furoate-vilanterol  1 puff Inhalation Daily  . insulin aspart  0-15 Units Subcutaneous TID WC  . insulin aspart  0-5 Units Subcutaneous QHS  . insulin regular human CONCENTRATED  15 Units Subcutaneous TID WC  . pravastatin  40 mg Oral QPM  . rOPINIRole  2 mg Oral BID   Continuous Infusions: PRN Meds:.acetaminophen, albuterol, loratadine, sodium chloride     Results for orders placed or performed during the hospital encounter of 08/04/19 (from the past 48 hour(s))  CBG monitoring, ED     Status: Abnormal   Collection Time: 08/04/19  5:18 PM  Result Value Ref Range   Glucose-Capillary 277 (H) 70 - 99 mg/dL    Comment: Glucose reference range applies only to samples taken after fasting for at least 8 hours.  CBC     Status: Abnormal   Collection Time: 08/04/19  5:22 PM  Result Value Ref Range   WBC 9.6 4.0 - 10.5 K/uL   RBC 4.36 4.22 - 5.81 MIL/uL   Hemoglobin 12.9 (L) 13.0 - 17.0 g/dL   HCT 40.3 39 - 52 %   MCV 92.4 80.0 - 100.0 fL   MCH 29.6 26.0 - 34.0 pg   MCHC 32.0 30.0 - 36.0 g/dL   RDW 13.8 11.5 - 15.5 %  Platelets 275 150 - 400 K/uL   nRBC 0.0 0.0 - 0.2 %    Comment: Performed at [facility], [address], [place], [place] [address]  Comprehensive metabolic panel     Status: Abnormal   Collection Time: 08/04/19  5:22 PM  Result Value Ref Range   Sodium 132 (L) 135 - 145 mmol/L   Potassium 3.8 3.5 - 5.1 mmol/L   Chloride 101 98 - 111 mmol/L   CO2 24 22 - 32 mmol/L   Glucose, Bld 302 (H) 70 - 99 mg/dL    Comment: Glucose reference range applies only to samples taken after fasting for at least 8 hours.   BUN 26 (H) 8 - 23 mg/dL   Creatinine, Ser 1.03 0.61 - 1.24 mg/dL   Calcium 9.1 8.9 - 10.3 mg/dL   Total Protein  7.6 6.5 - 8.1 g/dL   Albumin 4.5 3.5 - 5.0 g/dL   AST 17 15 - 41 U/L   ALT 20 0 - 44 U/L   Alkaline Phosphatase 63 38 - 126 U/L   Total Bilirubin 0.2 (L) 0.3 - 1.2 mg/dL   GFR calc non Af Amer >60 >60 mL/min   GFR calc Af Amer >60 >60 mL/min   Anion gap 7 5 - 15    Comment: Performed at [facility], [address], [place], [place] [address]  Ethanol     Status: None   Collection Time: 08/04/19  5:22 PM  Result Value Ref Range   Alcohol, Ethyl (B) <10 <10 mg/dL    Comment: (NOTE) Lowest detectable limit for serum alcohol is 10 mg/dL.  For medical purposes only. Performed at [facility], [address] [place], [place] [address]   Troponin I (High Sensitivity)     Status: None   Collection Time: 08/04/19  5:22 PM  Result Value Ref Range   Troponin I (High Sensitivity) 5 <18 ng/L    Comment: (NOTE) Elevated high sensitivity troponin I (hsTnI) values and significant  changes across serial measurements may suggest ACS but many other  chronic and acute conditions are known to elevate hsTnI results.  Refer to the "Links" section for chest pain algorithms and additional  guidance. Performed at [facility], [address] [place], [place] [address]   Urine rapid drug screen (hosp performed)not at [facility]     Status: None   Collection Time: 08/04/19  7:15 PM  Result Value Ref Range   Opiates NONE DETECTED NONE DETECTED   Cocaine NONE DETECTED NONE DETECTED   Benzodiazepines NONE DETECTED NONE DETECTED   Amphetamines NONE DETECTED NONE DETECTED   Tetrahydrocannabinol NONE DETECTED NONE DETECTED   Barbiturates NONE DETECTED NONE DETECTED    Comment: (NOTE) DRUG SCREEN FOR MEDICAL PURPOSES ONLY.  IF CONFIRMATION IS NEEDED FOR ANY PURPOSE, NOTIFY LAB WITHIN 5 DAYS.  LOWEST DETECTABLE LIMITS FOR URINE DRUG SCREEN Drug Class                     Cutoff (ng/mL) Amphetamine and metabolites    1000 Barbiturate and metabolites    200 Benzodiazepine                 094 Tricyclics and  metabolites     300 Opiates and metabolites        300 Cocaine and metabolites        300 THC  50 Performed at [facility], [address] [place], [place] [address]   Urinalysis, Routine w reflex microscopic     Status: Abnormal   Collection Time: 08/04/19  7:15 PM  Result Value Ref Range   Color, Urine STRAW (A) YELLOW   APPearance CLEAR CLEAR   Specific Gravity, Urine 1.008 1.005 - 1.030   pH 6.0 5.0 - 8.0   Glucose, UA >=500 (A) NEGATIVE mg/dL   Hgb urine dipstick NEGATIVE NEGATIVE   Bilirubin Urine NEGATIVE NEGATIVE   Ketones, ur NEGATIVE NEGATIVE mg/dL   Protein, ur 30 (A) NEGATIVE mg/dL   Nitrite NEGATIVE NEGATIVE   Leukocytes,Ua NEGATIVE NEGATIVE   RBC / HPF 0-5 0 - 5 RBC/hpf   WBC, UA 0-5 0 - 5 WBC/hpf   Bacteria, UA NONE SEEN NONE SEEN    Comment: Performed at [facility], [address], [place], [place] [address]  SARS Coronavirus 2 by RT PCR (hospital order, performed in [facility] hospital lab) Nasopharyngeal Nasopharyngeal Swab     Status: None   Collection Time: 08/04/19 10:12 PM   Specimen: Nasopharyngeal Swab  Result Value Ref Range   SARS Coronavirus 2 NEGATIVE NEGATIVE    Comment: (NOTE) SARS-CoV-2 target nucleic acids are NOT DETECTED.  The SARS-CoV-2 RNA is generally detectable in upper and lower respiratory specimens during the acute phase of infection. The lowest concentration of SARS-CoV-2 viral copies this assay can detect is 250 copies / mL. A negative result does not preclude SARS-CoV-2 infection and should not be used as the sole basis for treatment or other patient management decisions.  A negative result may occur with improper specimen collection / handling, submission of specimen other than nasopharyngeal swab, presence of viral mutation(s) within the areas targeted by this assay, and inadequate number of viral copies (<250 copies / mL). A negative result must be combined with clinical observations, patient  history, and epidemiological information.  Fact Sheet for Patients:   StrictlyIdeas.no  Fact Sheet for Healthcare Providers: BankingDealers.co.za  This test is not yet approved or  cleared by the [place] FDA and has been authorized for detection and/or diagnosis of SARS-CoV-2 by FDA under an Emergency Use Authorization (EUA).  This EUA will remain in effect (meaning this test can be used) for the duration of the COVID-19 declaration under Section 564(b)(1) of the Act, 21 U.S.C. section 360bbb-3(b)(1), unless the authorization is terminated or revoked sooner.  Performed at [facility], [address] [place],  [address]   Comprehensive metabolic panel     Status: Abnormal   Collection Time: 08/05/19  3:34 AM  Result Value Ref Range   Sodium 135 135 - 145 mmol/L   Potassium 4.2 3.5 - 5.1 mmol/L   Chloride 102 98 - 111 mmol/L   CO2 25 22 - 32 mmol/L   Glucose, Bld 336 (H) 70 - 99 mg/dL    Comment: Glucose reference range applies only to samples taken after fasting for at least 8 hours.   BUN 22 8 - 23 mg/dL   Creatinine, Ser 0.94 0.61 - 1.24 mg/dL   Calcium 9.0 8.9 - 10.3 mg/dL   Total Protein 6.9 6.5 - 8.1 g/dL   Albumin 3.8 3.5 - 5.0 g/dL   AST 15 15 - 41 U/L   ALT 18 0 - 44 U/L   Alkaline Phosphatase 52 38 - 126 U/L   Total Bilirubin 0.4 0.3 - 1.2 mg/dL   GFR calc non Af Amer >60 >60 mL/min   GFR  calc Af Amer >60 >60 mL/min   Anion gap 8 5 - 15    Comment: Performed at [facility], [address] [place], [place] [address]  CBC     Status: Abnormal   Collection Time: 08/05/19  3:34 AM  Result Value Ref Range   WBC 8.5 4.0 - 10.5 K/uL   RBC 4.30 4.22 - 5.81 MIL/uL   Hemoglobin 12.5 (L) 13.0 - 17.0 g/dL   HCT 39.5 39 - 52 %   MCV 91.9 80.0 - 100.0 fL   MCH 29.1 26.0 - 34.0 pg   MCHC 31.6 30.0 - 36.0 g/dL   RDW 13.5 11.5 - 15.5 %   Platelets 241 150 - 400 K/uL   nRBC 0.0 0.0 - 0.2 %    Comment: Performed at  [facility], [address], [place], [place] [address]  Protime-INR     Status: None   Collection Time: 08/05/19  3:34 AM  Result Value Ref Range   Prothrombin Time 12.2 11.4 - 15.2 seconds   INR 0.9 0.8 - 1.2    Comment: (NOTE) INR goal varies based on device and disease states. Performed at [facility], [address] [place], [place] [address]   APTT     Status: None   Collection Time: 08/05/19  3:34 AM  Result Value Ref Range   aPTT 26 24 - 36 seconds    Comment: Performed at [facility], [address], [place], [place] [address]  Magnesium     Status: None   Collection Time: 08/05/19  3:34 AM  Result Value Ref Range   Magnesium 1.9 1.7 - 2.4 mg/dL    Comment: Performed at [facility], [address] [place], [place] [address]  Phosphorus     Status: None   Collection Time: 08/05/19  3:34 AM  Result Value Ref Range   Phosphorus 3.4 2.5 - 4.6 mg/dL    Comment: Performed at [facility], [address] [place], [place] [address]  Lipid panel     Status: Abnormal   Collection Time: 08/05/19  3:34 AM  Result Value Ref Range   Cholesterol 177 0 - 200 mg/dL   Triglycerides 95 <150 mg/dL   HDL 56 >40 mg/dL   Total CHOL/HDL Ratio 3.2 RATIO   VLDL 19 0 - 40 mg/dL   LDL Cholesterol 102 (H) 0 - 99 mg/dL    Comment:        Total Cholesterol/HDL:CHD Risk Coronary Heart Disease Risk Table                     Men   Women  1/2 Average Risk   3.4   3.3  Average Risk       5.0   4.4  2 X Average Risk   9.6   7.1  3 X Average Risk  23.4   11.0        Use the calculated Patient Ratio above and the CHD Risk Table to determine the patient's CHD Risk.        ATP III CLASSIFICATION (LDL):  <100     mg/dL   Optimal  100-129  mg/dL   Near or Above                    Optimal  130-159  mg/dL   Borderline  160-189  mg/dL   High  >190     mg/dL   Very High Performed at [facility],  [address], [place], [place] [address]   CBG monitoring, ED     Status: Abnormal   Collection  Time: 08/05/19  8:08 AM  Result Value Ref Range   Glucose-Capillary 370 (H) 70 - 99 mg/dL    Comment: Glucose reference range applies only to samples taken after fasting for at least 8 hours.  CBG monitoring, ED     Status: Abnormal   Collection Time: 08/05/19  8:08 AM  Result Value Ref Range   Glucose-Capillary 370 (H) 70 - 99 mg/dL    Comment: Glucose reference range applies only to samples taken after fasting for at least 8 hours.  CBG monitoring, ED     Status: Abnormal   Collection Time: 08/05/19 12:13 PM  Result Value Ref Range   Glucose-Capillary 395 (H) 70 - 99 mg/dL    Comment: Glucose reference range applies only to samples taken after fasting for at least 8 hours.  CBG monitoring, ED     Status: Abnormal   Collection Time: 08/05/19  4:54 PM  Result Value Ref Range   Glucose-Capillary 383 (H) 70 - 99 mg/dL    Comment: Glucose reference range applies only to samples taken after fasting for at least 8 hours.    Studies/Results:  BRAIN MRI MRA  FINDINGS: MRA NECK  Motion artifact is present. Great vessel origins are patent. Common, internal, and external carotid arteries are patent. There is likely atherosclerotic irregularity at the right greater than left ICA origins without hemodynamically significant stenosis.  MRA HEAD  Motion artifact is present. Intracranial internal carotid arteries are patent. Middle and anterior cerebral arteries are patent. Intracranial vertebral arteries, basilar artery, posterior cerebral arteries are patent. There is no significant stenosis or aneurysm.  IMPRESSION: Degraded by motion. No large vessel occlusion or hemodynamically significant stenosis.    BRAIN MRI FINDINGS: Brain: Negative for acute infarct  Mild chronic ischemic change. Small chronic infarct in the left frontal cortex. Small chronic white matter infarcts left frontal parietal white matter. Negative for hemorrhage or mass. Ventricle size  normal.  Vascular: Normal arterial flow voids.  Skull and upper cervical spine: No focal skeletal lesion.  Sinuses/Orbits: Mild mucosal edema paranasal sinuses. Bilateral cataract extraction.  Other: None  IMPRESSION: Negative for acute infarct.  Mild chronic ischemic changes on the left.  THE BRAIN MRI IS REVIEWED. IT IS COMPARED TO THE PREVIOUS SCAN.  No acute changes are noted on DWI. No hemorrhages noted. There is 3 of 4 deep white matter tiny increase it involving the left frontal area. There is also increased gyral signal involving the left mid frontal lobe. This is a small area. This can is unchanged from the previous study done in February of this year. The right frontal lobe does appear generally  Other smaller than the right side.     Akari Defelice A. Paul Baker, M.D.  Diplomate, Tax adviser of Psychiatry and Neurology ( Neurology). 08/05/2019, 6:18 PM

## 2019-08-05 NOTE — ED Notes (Signed)
ED TO INPATIENT HANDOFF REPORT  ED Nurse Name and Phone #:   S Name/Age/Gender Paul Baker 69 y.o. male Room/Bed: APA06/APA06  Code Status   Code Status: Full Code  Home/SNF/Other Home Patient oriented to: self, place, time and situation Is this baseline? Yes   Triage Complete: Triage complete  Chief Complaint Vertigo [R42]  Triage Note Pt reports headache x2 days, intermittent blurred vision dizziness since yesterday. Pt reports decreased sensation on right. Pt ambulates from wheelchair to stretcher with steady gait. Speech clear. Facial symmetry noted. Pt sent over by pcp.     Allergies Allergies  Allergen Reactions   Tape Rash    Pulls off skin   Cardizem [Diltiazem Hcl]     Edema    Cardura [Doxazosin Mesylate]     Headaches / cramps   Lipitor [Atorvastatin] Other (See Comments)    Leg cramps   Paxil [Paroxetine Hcl]     Unknown reaction    Codeine Rash and Other (See Comments)    Headache    Gabapentin Rash    Level of Care/Admitting Diagnosis ED Disposition    ED Disposition Condition Comment   Admit  Hospital Area: [facility] [[id number]]  Level of Care: Telemetry [5]  Covid Evaluation: Asymptomatic Screening Protocol (No Symptoms)  Diagnosis: Vertigo [163846]  Admitting Physician: Bernadette Hoit [[id number]]  Attending Physician: Bernadette Hoit [[id number]]  Estimated length of stay: past midnight tomorrow  Certification:: I certify this patient will need inpatient services for at least 2 midnights       B Medical/Surgery History Past Medical History:  Diagnosis Date   Arthritis    Asthma    Colon polyp    Coronary atherosclerosis of native coronary artery    a. 2011: cath showing 90% stenosis along small non-dominant RCA (too small for PCI). b. 01/2018: cath showing nonobstructive CAD with 60 to 70% proximal to mid nondominant RCA stenosis, 50% mid LAD and 40 to 50% OM1   DVT (deep venous thrombosis) ([place]) 2005    Right arm   Essential hypertension, benign    Headache    History of transfusion    Hypersomnia    CPAP of 16 cm, diagnosed with AHI of 60 in 2012,[place] 21- narcolepsy?   Hypothyroidism    Mixed hyperlipidemia    Morbid obesity ([place])    MRSA (methicillin resistant staph aureus) culture positive    08/2012   Narcolepsy    OSA (obstructive sleep apnea)    CPAP   Osteoarthritis    Pneumonia    Psoriasis    Pulmonary embolism ([place]) 2004   RLS (restless legs syndrome)    Rotator cuff disorder    Left   Septic arthritis of knee, left (HCC)    Skin cancer, basal cell    Type 2 diabetes mellitus ([place])    Past Surgical History:  Procedure Laterality Date   BACK SURGERY     CATARACT EXTRACTION Bilateral    COLONOSCOPY     CYST REMOVAL TRUNK     from back   EYE SURGERY Left 2016   laser to left eye   HIP SURGERY     bone removed from both sides of hip   KNEE ARTHROTOMY Right 12/04/2014   Procedure: KNEE ARTHROTOMY PATELLA LIGAMENT RECONSTRUSION AND REPAIR RIGHT KNEE;  Surgeon: Paralee Cancel, MD;  Location: [facility];  Service: Orthopedics;  Laterality: Right;   KNEE SURGERY     X 25 TIMES   LEFT HEART CATH AND CORONARY  ANGIOGRAPHY N/A 01/17/2018   Procedure: LEFT HEART CATH AND CORONARY ANGIOGRAPHY;  Surgeon: Belva Crome, MD;  Location: [facility];  Service: Cardiovascular;  Laterality: N/A;   LUMBAR DISC SURGERY     Left L3, L4, L5 discecotomy with decompression of L4 root   TONSILLECTOMY     TOTAL KNEE ARTHROPLASTY  2003   LEFT   TOTAL KNEE ARTHROPLASTY Right 03/23/2014   Procedure: RIGHT TOTAL KNEE ARTHROPLASTY AND REMOVAL RIGHT TIBIAL  DEEP IMPLANT STAPLE;  Surgeon: Paralee Cancel, MD;  Location: [facility];  Service: Orthopedics;  Laterality: Right;   TOTAL KNEE REVISION  2005   LEFT   WRIST SURGERY       A IV Location/Drains/Wounds Patient Lines/Drains/Airways Status    Active Line/Drains/Airways    Name Placement date Placement  time Site Days   Peripheral IV 08/04/19 Right Antecubital 08/04/19  1723  Antecubital  1   Incision (Closed) 03/23/14 Knee Right 03/23/14  1340   1961   Incision (Closed) 12/04/14 Knee Right 12/04/14  1618   1705          Intake/Output Last 24 hours  Intake/Output Summary (Last 24 hours) at 08/05/2019 1453 Last data filed at 08/05/2019 1316 Gross per 24 hour  Intake --  Output 725 ml  Net -725 ml    Labs/Imaging Results for orders placed or performed during the hospital encounter of 08/04/19 (from the past 48 hour(s))  CBG monitoring, ED     Status: Abnormal   Collection Time: 08/04/19  5:18 PM  Result Value Ref Range   Glucose-Capillary 277 (H) 70 - 99 mg/dL    Comment: Glucose reference range applies only to samples taken after fasting for at least 8 hours.  CBC     Status: Abnormal   Collection Time: 08/04/19  5:22 PM  Result Value Ref Range   WBC 9.6 4.0 - 10.5 K/uL   RBC 4.36 4.22 - 5.81 MIL/uL   Hemoglobin 12.9 (L) 13.0 - 17.0 g/dL   HCT 40.3 39 - 52 %   MCV 92.4 80.0 - 100.0 fL   MCH 29.6 26.0 - 34.0 pg   MCHC 32.0 30.0 - 36.0 g/dL   RDW 13.8 11.5 - 15.5 %   Platelets 275 150 - 400 K/uL   nRBC 0.0 0.0 - 0.2 %    Comment: Performed at [facility], [address] [place], [place] [address]  Comprehensive metabolic panel     Status: Abnormal   Collection Time: 08/04/19  5:22 PM  Result Value Ref Range   Sodium 132 (L) 135 - 145 mmol/L   Potassium 3.8 3.5 - 5.1 mmol/L   Chloride 101 98 - 111 mmol/L   CO2 24 22 - 32 mmol/L   Glucose, Bld 302 (H) 70 - 99 mg/dL    Comment: Glucose reference range applies only to samples taken after fasting for at least 8 hours.   BUN 26 (H) 8 - 23 mg/dL   Creatinine, Ser 1.03 0.61 - 1.24 mg/dL   Calcium 9.1 8.9 - 10.3 mg/dL   Total Protein 7.6 6.5 - 8.1 g/dL   Albumin 4.5 3.5 - 5.0 g/dL   AST 17 15 - 41 U/L   ALT 20 0 - 44 U/L   Alkaline Phosphatase 63 38 - 126 U/L   Total Bilirubin 0.2 (L) 0.3 - 1.2 mg/dL   GFR calc non Af Amer  >60 >60 mL/min   GFR calc Af Amer >60 >60 mL/min  Anion gap 7 5 - 15    Comment: Performed at [facility], [address] [place], [place] [address]  Ethanol     Status: None   Collection Time: 08/04/19  5:22 PM  Result Value Ref Range   Alcohol, Ethyl (B) <10 <10 mg/dL    Comment: (NOTE) Lowest detectable limit for serum alcohol is 10 mg/dL.  For medical purposes only. Performed at [facility], [address], [place], [place] [address]   Troponin I (High Sensitivity)     Status: None   Collection Time: 08/04/19  5:22 PM  Result Value Ref Range   Troponin I (High Sensitivity) 5 <18 ng/L    Comment: (NOTE) Elevated high sensitivity troponin I (hsTnI) values and significant  changes across serial measurements may suggest ACS but many other  chronic and acute conditions are known to elevate hsTnI results.  Refer to the "Links" section for chest pain algorithms and additional  guidance. Performed at [facility], [address], [place], [place] [address]   Urine rapid drug screen (hosp performed)not at [facility]     Status: None   Collection Time: 08/04/19  7:15 PM  Result Value Ref Range   Opiates NONE DETECTED NONE DETECTED   Cocaine NONE DETECTED NONE DETECTED   Benzodiazepines NONE DETECTED NONE DETECTED   Amphetamines NONE DETECTED NONE DETECTED   Tetrahydrocannabinol NONE DETECTED NONE DETECTED   Barbiturates NONE DETECTED NONE DETECTED    Comment: (NOTE) DRUG SCREEN FOR MEDICAL PURPOSES ONLY.  IF CONFIRMATION IS NEEDED FOR ANY PURPOSE, NOTIFY LAB WITHIN 5 DAYS.  LOWEST DETECTABLE LIMITS FOR URINE DRUG SCREEN Drug Class                     Cutoff (ng/mL) Amphetamine and metabolites    1000 Barbiturate and metabolites    200 Benzodiazepine                 122 Tricyclics and metabolites     300 Opiates and metabolites        300 Cocaine and metabolites        300 THC                            50 Performed at [facility], [address], [place], [place] [address]    Urinalysis, Routine w reflex microscopic     Status: Abnormal   Collection Time: 08/04/19  7:15 PM  Result Value Ref Range   Color, Urine STRAW (A) YELLOW   APPearance CLEAR CLEAR   Specific Gravity, Urine 1.008 1.005 - 1.030   pH 6.0 5.0 - 8.0   Glucose, UA >=500 (A) NEGATIVE mg/dL   Hgb urine dipstick NEGATIVE NEGATIVE   Bilirubin Urine NEGATIVE NEGATIVE   Ketones, ur NEGATIVE NEGATIVE mg/dL   Protein, ur 30 (A) NEGATIVE mg/dL   Nitrite NEGATIVE NEGATIVE   Leukocytes,Ua NEGATIVE NEGATIVE   RBC / HPF 0-5 0 - 5 RBC/hpf   WBC, UA 0-5 0 - 5 WBC/hpf   Bacteria, UA NONE SEEN NONE SEEN    Comment: Performed at [facility], [address] [place],  [address]  SARS Coronavirus 2 by RT PCR (hospital order, performed in [place] hospital lab) Nasopharyngeal Nasopharyngeal Swab     Status: None   Collection Time: 08/04/19 10:12 PM   Specimen: Nasopharyngeal Swab  Result Value Ref Range   SARS Coronavirus 2 NEGATIVE NEGATIVE    Comment: (NOTE)  SARS-CoV-2 target nucleic acids are NOT DETECTED.  The SARS-CoV-2 RNA is generally detectable in upper and lower respiratory specimens during the acute phase of infection. The lowest concentration of SARS-CoV-2 viral copies this assay can detect is 250 copies / mL. A negative result does not preclude SARS-CoV-2 infection and should not be used as the sole basis for treatment or other patient management decisions.  A negative result may occur with improper specimen collection / handling, submission of specimen other than nasopharyngeal swab, presence of viral mutation(s) within the areas targeted by this assay, and inadequate number of viral copies (<250 copies / mL). A negative result must be combined with clinical observations, patient history, and epidemiological information.  Fact Sheet for Patients:   StrictlyIdeas.no  Fact Sheet for Healthcare Providers: BankingDealers.co.za  This  test is not yet approved or  cleared by the [place] FDA and has been authorized for detection and/or diagnosis of SARS-CoV-2 by FDA under an Emergency Use Authorization (EUA).  This EUA will remain in effect (meaning this test can be used) for the duration of the COVID-19 declaration under Section 564(b)(1) of the Act, 21 U.S.C. section 360bbb-3(b)(1), unless the authorization is terminated or revoked sooner.  Performed at [facility], [address], [place], [place] [address]   Comprehensive metabolic panel     Status: Abnormal   Collection Time: 08/05/19  3:34 AM  Result Value Ref Range   Sodium 135 135 - 145 mmol/L   Potassium 4.2 3.5 - 5.1 mmol/L   Chloride 102 98 - 111 mmol/L   CO2 25 22 - 32 mmol/L   Glucose, Bld 336 (H) 70 - 99 mg/dL    Comment: Glucose reference range applies only to samples taken after fasting for at least 8 hours.   BUN 22 8 - 23 mg/dL   Creatinine, Ser 0.94 0.61 - 1.24 mg/dL   Calcium 9.0 8.9 - 10.3 mg/dL   Total Protein 6.9 6.5 - 8.1 g/dL   Albumin 3.8 3.5 - 5.0 g/dL   AST 15 15 - 41 U/L   ALT 18 0 - 44 U/L   Alkaline Phosphatase 52 38 - 126 U/L   Total Bilirubin 0.4 0.3 - 1.2 mg/dL   GFR calc non Af Amer >60 >60 mL/min   GFR calc Af Amer >60 >60 mL/min   Anion gap 8 5 - 15    Comment: Performed at [facility], [address], [place], [place] [address]  CBC     Status: Abnormal   Collection Time: 08/05/19  3:34 AM  Result Value Ref Range   WBC 8.5 4.0 - 10.5 K/uL   RBC 4.30 4.22 - 5.81 MIL/uL   Hemoglobin 12.5 (L) 13.0 - 17.0 g/dL   HCT 39.5 39 - 52 %   MCV 91.9 80.0 - 100.0 fL   MCH 29.1 26.0 - 34.0 pg   MCHC 31.6 30.0 - 36.0 g/dL   RDW 13.5 11.5 - 15.5 %   Platelets 241 150 - 400 K/uL   nRBC 0.0 0.0 - 0.2 %    Comment: Performed at [facility], [address], [place],  [address]  Protime-INR     Status: None   Collection Time: 08/05/19  3:34 AM  Result Value Ref Range   Prothrombin Time 12.2 11.4 - 15.2 seconds   INR 0.9  0.8 - 1.2    Comment: (NOTE) INR goal varies based on device and disease states. Performed at [facility], [address], [place],  [place] [address]   APTT     Status: None   Collection Time: 08/05/19  3:34 AM  Result Value Ref Range   aPTT 26 24 - 36 seconds    Comment: Performed at [facility], [address], [place], [place] [address]  Magnesium     Status: None   Collection Time: 08/05/19  3:34 AM  Result Value Ref Range   Magnesium 1.9 1.7 - 2.4 mg/dL    Comment: Performed at [facility], [address], [place], [place] [address]  Phosphorus     Status: None   Collection Time: 08/05/19  3:34 AM  Result Value Ref Range   Phosphorus 3.4 2.5 - 4.6 mg/dL    Comment: Performed at [facility], [address], [place], [place] [address]  Lipid panel     Status: Abnormal   Collection Time: 08/05/19  3:34 AM  Result Value Ref Range   Cholesterol 177 0 - 200 mg/dL   Triglycerides 95 <150 mg/dL   HDL 56 >40 mg/dL   Total CHOL/HDL Ratio 3.2 RATIO   VLDL 19 0 - 40 mg/dL   LDL Cholesterol 102 (H) 0 - 99 mg/dL    Comment:        Total Cholesterol/HDL:CHD Risk Coronary Heart Disease Risk Table                     Men   Women  1/2 Average Risk   3.4   3.3  Average Risk       5.0   4.4  2 X Average Risk   9.6   7.1  3 X Average Risk  23.4   11.0        Use the calculated Patient Ratio above and the CHD Risk Table to determine the patient's CHD Risk.        ATP III CLASSIFICATION (LDL):  <100     mg/dL   Optimal  100-129  mg/dL   Near or Above                    Optimal  130-159  mg/dL   Borderline  160-189  mg/dL   High  >190     mg/dL   Very High Performed at [facility], [address], [place], [place] [address]   CBG monitoring, ED     Status: Abnormal   Collection Time: 08/05/19  8:08 AM  Result Value Ref Range   Glucose-Capillary 370 (H) 70 - 99 mg/dL    Comment: Glucose reference range applies only to samples taken after fasting for at least 8 hours.  CBG  monitoring, ED     Status: Abnormal   Collection Time: 08/05/19  8:08 AM  Result Value Ref Range   Glucose-Capillary 370 (H) 70 - 99 mg/dL    Comment: Glucose reference range applies only to samples taken after fasting for at least 8 hours.  CBG monitoring, ED     Status: Abnormal   Collection Time: 08/05/19 12:13 PM  Result Value Ref Range   Glucose-Capillary 395 (H) 70 - 99 mg/dL    Comment: Glucose reference range applies only to samples taken after fasting for at least 8 hours.   MR ANGIO HEAD WO CONTRAST  Result Date: 08/05/2019 CLINICAL DATA:  Altered mental status EXAM: MRA NECK WITHOUT AND WITH CONTRAST MRA HEAD WITHOUT CONTRAST TECHNIQUE: Multiplanar and multiecho pulse sequences of the neck were obtained without and with intravenous contrast. Angiographic images of  the neck were obtained using MRA technique without and with intravenous contast.; Angiographic images of the Circle of Willis were obtained using MRA technique without intravenous contrast. CONTRAST:  22mL GADAVIST GADOBUTROL 1 MMOL/ML IV SOLN COMPARISON:  None. FINDINGS: MRA NECK Motion artifact is present. Great vessel origins are patent. Common, internal, and external carotid arteries are patent. There is likely atherosclerotic irregularity at the right greater than left ICA origins without hemodynamically significant stenosis. MRA HEAD Motion artifact is present. Intracranial internal carotid arteries are patent. Middle and anterior cerebral arteries are patent. Intracranial vertebral arteries, basilar artery, posterior cerebral arteries are patent. There is no significant stenosis or aneurysm. IMPRESSION: Degraded by motion. No large vessel occlusion or hemodynamically significant stenosis. Electronically Signed   By: Macy Mis M.D.   On: 08/05/2019 14:00   MR ANGIO NECK W WO CONTRAST  Result Date: 08/05/2019 CLINICAL DATA:  Altered mental status EXAM: MRA NECK WITHOUT AND WITH CONTRAST MRA HEAD WITHOUT CONTRAST  TECHNIQUE: Multiplanar and multiecho pulse sequences of the neck were obtained without and with intravenous contrast. Angiographic images of the neck were obtained using MRA technique without and with intravenous contast.; Angiographic images of the Circle of Willis were obtained using MRA technique without intravenous contrast. CONTRAST:  45mL GADAVIST GADOBUTROL 1 MMOL/ML IV SOLN COMPARISON:  None. FINDINGS: MRA NECK Motion artifact is present. Great vessel origins are patent. Common, internal, and external carotid arteries are patent. There is likely atherosclerotic irregularity at the right greater than left ICA origins without hemodynamically significant stenosis. MRA HEAD Motion artifact is present. Intracranial internal carotid arteries are patent. Middle and anterior cerebral arteries are patent. Intracranial vertebral arteries, basilar artery, posterior cerebral arteries are patent. There is no significant stenosis or aneurysm. IMPRESSION: Degraded by motion. No large vessel occlusion or hemodynamically significant stenosis. Electronically Signed   By: Macy Mis M.D.   On: 08/05/2019 14:00   MR BRAIN WO CONTRAST  Result Date: 08/04/2019 CLINICAL DATA:  Acute neuro deficit. Dizziness ataxia right-sided numbness. Headache 2 days EXAM: MRI HEAD WITHOUT CONTRAST TECHNIQUE: Multiplanar, multiecho pulse sequences of the brain and surrounding structures were obtained without intravenous contrast. COMPARISON:  MRI head 02/19/2019 FINDINGS: Brain: Negative for acute infarct Mild chronic ischemic change. Small chronic infarct in the left frontal cortex. Small chronic white matter infarcts left frontal parietal white matter. Negative for hemorrhage or mass. Ventricle size normal. Vascular: Normal arterial flow voids. Skull and upper cervical spine: No focal skeletal lesion. Sinuses/Orbits: Mild mucosal edema paranasal sinuses. Bilateral cataract extraction. Other: None IMPRESSION: Negative for acute infarct.  Mild chronic ischemic changes on the left. Electronically Signed   By: Franchot Gallo M.D.   On: 08/04/2019 19:20    Pending Labs Unresulted Labs (From admission, onward) Comment         None      Vitals/Pain Today's Vitals   08/05/19 0621 08/05/19 1103 08/05/19 1203 08/05/19 1233  BP: (!) 154/77     Pulse: 71 78 71 72  Resp: 10     Temp:      TempSrc:      SpO2: 96% 92% 97% 98%  Weight:      Height:      PainSc:        Isolation Precautions No active isolations  Medications Medications  pravastatin (PRAVACHOL) tablet 40 mg (40 mg Oral Not Given 08/05/19 0048)  rOPINIRole (REQUIP) tablet 2 mg (2 mg Oral Given 08/05/19 0141)  albuterol (VENTOLIN HFA) 108 (90 Base) MCG/ACT inhaler  2 puff (has no administration in time range)  fluticasone furoate-vilanterol (BREO ELLIPTA) 200-25 MCG/INH 1 puff (1 puff Inhalation Given 08/05/19 0819)  loratadine (CLARITIN) tablet 10 mg (has no administration in time range)  sodium chloride (OCEAN) 0.65 % nasal spray 1-2 spray (has no administration in time range)  clopidogrel (PLAVIX) tablet 75 mg (75 mg Oral Given 08/05/19 0919)  aspirin EC tablet 81 mg (81 mg Oral Given 08/05/19 0919)  insulin aspart (novoLOG) injection 0-15 Units (15 Units Subcutaneous Given 08/05/19 1309)  insulin aspart (novoLOG) injection 0-5 Units (0 Units Subcutaneous Not Given 08/05/19 0124)  acetaminophen (TYLENOL) tablet 650 mg (has no administration in time range)  insulin regular human CONCENTRATED (HUMULIN R) 500 UNIT/ML kwikpen 15 Units (has no administration in time range)  meclizine (ANTIVERT) tablet 25 mg (25 mg Oral Given 08/04/19 1933)  gadobutrol (GADAVIST) 1 MMOL/ML injection 10 mL (10 mLs Intravenous Contrast Given 08/05/19 1240)    Mobility walks High fall risk   Focused Assessments    R Recommendations: See Admitting Provider Note  Report given to:   Additional Notes:

## 2019-08-10 ENCOUNTER — Other Ambulatory Visit: Payer: Self-pay | Admitting: Cardiology

## 2019-08-10 ENCOUNTER — Other Ambulatory Visit: Payer: Self-pay | Admitting: "Endocrinology

## 2019-08-12 ENCOUNTER — Telehealth: Payer: Self-pay | Admitting: "Endocrinology

## 2019-08-12 NOTE — Telephone Encounter (Signed)
Called patient and advised of changes, verbalized understanding.

## 2019-08-12 NOTE — Telephone Encounter (Signed)
Advise him to increase his U500 to 80 units with breakfast, 80 units with lunch, and 70 units at supper  for [place] blood glucose greater than 90 mg per DL.

## 2019-08-12 NOTE — Telephone Encounter (Signed)
Patient is calling about high blood sugar readings.  8/9 - AM 374, 344, 364, 286  8/10 - AM 374   [phone]

## 2019-08-12 NOTE — Telephone Encounter (Signed)
Please advise 

## 2019-08-14 DIAGNOSIS — E1110 Type 2 diabetes mellitus with ketoacidosis without coma: Secondary | ICD-10-CM | POA: Diagnosis not present

## 2019-08-20 ENCOUNTER — Other Ambulatory Visit: Payer: Self-pay

## 2019-08-20 ENCOUNTER — Encounter: Payer: Self-pay | Admitting: Family Medicine

## 2019-08-20 ENCOUNTER — Ambulatory Visit (INDEPENDENT_AMBULATORY_CARE_PROVIDER_SITE_OTHER): Payer: Medicare HMO | Admitting: Family Medicine

## 2019-08-20 VITALS — BP 118/72 | HR 84 | Temp 98.0°F | Ht 65.0 in | Wt 217.0 lb

## 2019-08-20 DIAGNOSIS — Z794 Long term (current) use of insulin: Secondary | ICD-10-CM

## 2019-08-20 DIAGNOSIS — E782 Mixed hyperlipidemia: Secondary | ICD-10-CM

## 2019-08-20 DIAGNOSIS — G459 Transient cerebral ischemic attack, unspecified: Secondary | ICD-10-CM

## 2019-08-20 DIAGNOSIS — I25119 Atherosclerotic heart disease of native coronary artery with unspecified angina pectoris: Secondary | ICD-10-CM

## 2019-08-20 DIAGNOSIS — Z8673 Personal history of transient ischemic attack (TIA), and cerebral infarction without residual deficits: Secondary | ICD-10-CM

## 2019-08-20 DIAGNOSIS — E11649 Type 2 diabetes mellitus with hypoglycemia without coma: Secondary | ICD-10-CM

## 2019-08-20 MED ORDER — CLOPIDOGREL BISULFATE 75 MG PO TABS: 75.0000 mg | ORAL_TABLET | Freq: Every day | ORAL | 1 refills | Status: DC

## 2019-08-20 NOTE — Progress Notes (Signed)
Patient ID: Paul Baker, male    DOB: 1950-02-16, 69 y.o.   MRN: [id number]   Chief Complaint  Patient presents with  . Hypertension    follow up   Subjective:    HPI Pt was in hospital 2 wks ago due to elevated BG, dizzness, and rt side weakness. Had dizziness and weakness on rt side. Sent to ER and admitted for 2 days.  Had elevated bg. Pt with history of stroke.  Negative for acute stroke work up. Rt hand some numbness at times. Not as sharp as the left.  Walking okay. Used a walker and was improving. Pt declined SNF and decided to go home.  DM2-  Seeing endo, Dr. Dorris Baker. Insulin was increased.  On humulin R 500. Asa and plavix 2 wks then plavix indefinitely. Cont pravastatin.  a1c 9.6.  BG was running 300-400s. bg better now at 120-140s. With some new changes with insulin by Dr. Dorris Baker. Has continuous BG monitor and having some lows having every night. Decreased the 70 at night, dec to 60.  So if going too low then has high alarm with bg 300. Wanted him to to 80-80 -70 humulin.  Has had lots of infections from after knee replacement. So not good candidate for insulin pump.   Medical History Paul Baker has a past medical history of Arthritis, Asthma, Colon polyp, Coronary atherosclerosis of native coronary artery, DVT (deep venous thrombosis) ([place]) (2005), Essential hypertension, benign, Headache, History of transfusion, Hypersomnia, Hypothyroidism, Mixed hyperlipidemia, Morbid obesity ([place]), MRSA (methicillin resistant staph aureus) culture positive, Narcolepsy, OSA (obstructive sleep apnea), Osteoarthritis, Pneumonia, Psoriasis, Pulmonary embolism ([place]) (2004), RLS (restless legs syndrome), Rotator cuff disorder, Septic arthritis of knee, left ([place]), Skin cancer, basal cell, and Type 2 diabetes mellitus ([place]).   Outpatient Encounter Medications as of 08/20/2019  Medication Sig  . albuterol (VENTOLIN HFA) 108 (90 Base) MCG/ACT inhaler Inhale 2 puffs into the lungs  every 6 (six) hours as needed for wheezing or shortness of breath.  . alfuzosin (UROXATRAL) 10 MG 24 hr tablet Take 10 mg by mouth daily with breakfast.  . amLODipine (NORVASC) 5 MG tablet TAKE 1 TABLET BY MOUTH ONCE DAILY **DOSE  INCREASE  05/21/2018** (Patient taking differently: Take 5 mg by mouth daily. )  . B-D ULTRAFINE III SHORT PEN 31G X 8 MM MISC USING FIVE TIMES DAILY.  [name] clopidogrel (PLAVIX) 75 MG tablet Take 1 tablet (75 mg total) by mouth daily.  . Continuous Blood Gluc Receiver (DEXCOM G6 RECEIVER) DEVI 1 Piece by Does not apply route as needed.  . Continuous Blood Gluc Sensor (DEXCOM G6 SENSOR) MISC 4 Pieces by Does not apply route once a week.  Arna Medici 137 MCG tablet TAKE 1 TABLET BY MOUTH ONCE DAILY BEFORE BREAKFAST  . fluticasone furoate-vilanterol (BREO ELLIPTA) 200-25 MCG/INH AEPB Inhale 1 puff into the lungs daily.  . furosemide (LASIX) 40 MG tablet Take 40 mg by mouth. Take 40 mg alternating with 80 mg Daily  . glipiZIDE (GLUCOTROL XL) 5 MG 24 hr tablet Take 1 tablet (5 mg total) by mouth daily with breakfast.  . insulin regular human CONCENTRATED (HUMULIN R U-500 KWIKPEN) 500 UNIT/ML kwikpen Inject 60-70 Units into the skin 3 (three) times daily with meals. Per sliding scale.  . isosorbide mononitrate (IMDUR) 60 MG 24 hr tablet TAKE 1 TABLET BY MOUTH IN THE MORNING AND 1/2 (ONE-HALF) IN THE EVENING  . loratadine (CLARITIN) 10 MG tablet Take 10 mg by mouth daily as needed for  allergies.   [name] losartan (COZAAR) 50 MG tablet Take 1 tablet (50 mg total) by mouth daily.  . metFORMIN (GLUCOPHAGE) 500 MG tablet TAKE 1 TABLET BY MOUTH TWICE DAILY WITH MEALS  . metoprolol succinate (TOPROL-XL) 50 MG 24 hr tablet Take 1 tablet by mouth twice daily  . nitroGLYCERIN (NITROSTAT) 0.4 MG SL tablet DISSOLVE 1 TABLET UNDER TONGUE EVERY 5 MINUTES UP TO 15 MIN FOR CHESTPAIN. IF NO RELIEF CALL 911.  [name] ONE TOUCH ULTRA TEST test strip TEST BLOOD SUGAR UP TO 4 TIMES DAILY.  [name] ONETOUCH DELICA  LANCETS 61W MISC USE AS DIRECTED TO TEST BLOOD SUGAR 4 TIMES DAILY.  . pravastatin (PRAVACHOL) 40 MG tablet Take 1 tablet (40 mg total) by mouth every evening.  [name] rOPINIRole (REQUIP) 4 MG tablet Take 1/2 (one-half) tablet by mouth twice daily (Patient taking differently: Take 2 mg by mouth 2 (two) times daily. )  . sodium chloride (OCEAN) 0.65 % SOLN nasal spray Place 1-2 sprays into both nostrils daily as needed for congestion.  . Vitamin D, Ergocalciferol, (DRISDOL) 1.25 MG (50000 UNIT) CAPS capsule Take 1 capsule by mouth once a week  . [DISCONTINUED] clopidogrel (PLAVIX) 75 MG tablet Take 1 tablet (75 mg total) by mouth daily.   No facility-administered encounter medications on file as of 08/20/2019.     Review of Systems  Constitutional: Negative for chills and fever.  HENT: Negative for congestion, rhinorrhea and sore throat.   Respiratory: Negative for cough, shortness of breath and wheezing.   Cardiovascular: Negative for chest pain and leg swelling.  Gastrointestinal: Negative for abdominal pain, diarrhea, nausea and vomiting.  Genitourinary: Negative for dysuria and frequency.  Skin: Negative for rash.  Neurological: Positive for numbness (rt hand/arm). Negative for dizziness, weakness and headaches.     Vitals BP 118/72   Pulse 84   Temp 98 F (36.7 C) (Oral)   Ht 5\' 5"  (1.651 m)   Wt 217 lb (98.4 kg)   SpO2 98%   BMI 36.11 kg/m   Objective:   Physical Exam Vitals and nursing note reviewed.  Constitutional:      General: He is not in acute distress.    Appearance: Normal appearance. He is not ill-appearing.  HENT:     Head: Normocephalic.     Nose: Nose normal. No congestion.     Mouth/Throat:     Mouth: Mucous membranes are moist.     Pharynx: No oropharyngeal exudate.  Eyes:     Extraocular Movements: Extraocular movements intact.     Conjunctiva/sclera: Conjunctivae normal.     Pupils: Pupils are equal, round, and reactive to light.  Cardiovascular:      Rate and Rhythm: Normal rate and regular rhythm.     Pulses: Normal pulses.     Heart sounds: Normal heart sounds. No murmur heard.   Pulmonary:     Effort: Pulmonary effort is normal.     Breath sounds: Normal breath sounds. No wheezing, rhonchi or rales.  Musculoskeletal:        General: Normal range of motion.     Right lower leg: No edema.     Left lower leg: No edema.  Skin:    General: Skin is warm and dry.     Findings: No rash.  Neurological:     General: No focal deficit present.     Mental Status: He is alert and oriented to person, place, and time.     Cranial Nerves: No cranial nerve deficit.  Motor: No weakness.     Gait: Gait normal.     Comments: +dec sensation on rt hand/arm.  Psychiatric:        Mood and Affect: Mood normal.        Behavior: Behavior normal.        Thought Content: Thought content normal.        Judgment: Judgment normal.      Assessment and Plan   1. Type 2 diabetes mellitus with hypoglycemia without coma, with long-term current use of insulin ([place])  2. Mixed hyperlipidemia  3. Atherosclerosis of native coronary artery of native heart with angina pectoris ([place])  4. TIA (transient ischemic attack) - clopidogrel (PLAVIX) 75 MG tablet; Take 1 tablet (75 mg total) by mouth daily.  Dispense: 90 tablet; Refill: 1  5. History of cardioembolic cerebrovascular accident (CVA) - clopidogrel (PLAVIX) 75 MG tablet; Take 1 tablet (75 mg total) by mouth daily.  Dispense: 90 tablet; Refill: 1   Seeing nephrology 9/21 and Dr. Dorris Baker seeing them 9/21. Getting labs at that time.  H/o TIA- Has some intermittent rt hand numbness, feels back to his baseline due to had surgery on the rt wrist with residual numbness in past.  No speech, or weakness in legs/arms. No other deficits.  HLD- stable, cont to monitor.  LDL improved from 2/21 from 129 to 102. Cont meds.  DM2- cont f/u with Dr. Dorris Baker and watch carb intake and record BG.  F/u 12mo or prn.

## 2019-08-25 NOTE — Progress Notes (Signed)
PATIENT: Paul Baker DOB: September 03, 1950  REASON FOR VISIT: follow up HISTORY FROM: patient  Chief Complaint  Patient presents with  . Follow-up    3 month f/u, states machine will randomally cut off during the night. Pressure seems to be fine. Uses [facility] as his DME.   . room 1    alone      HISTORY OF PRESENT ILLNESS: Today 08/26/19 Paul Baker is a 69 y.o. male here today for follow up for OSA on CPAP, RLS and EDS. He continues to do fairly well on Requip and modafinil. He admits that he has had a difficult time with using CPAP. He has not been able to get comfortable with his mask. He also states that CPAP will cut off in the middle of the night. He called [facility] but has not taken his machine in to be checked out. He knows that he needs to use CPAP and wants to continue working on compliance.   Compliance report dated 07/26/2019 through 08/24/2019 reveals that he used CPAP 28 of the last 30 days for compliance of 67%.  He used CPAP greater than 4 hours 6 of the past 30 days for compliance of 20%.  Average usage on days used was 3 hours and 35 minutes.  Residual AHI was 6.8 with a set pressure of 10 cm of water and an EPR of 3.  There was a leak noted in the 95th percentile of 25.4.  He was seen in the ER on 08/04/2019 for dizziness and right sided numbness. CBG was elevated (300's). Insulin was increased and he was started on Plavix for concerns of TIA. He is followed by PCP, last seen 8/18.     HISTORY: (copied from my note on 05/20/2019)  Paul Baker is a 69 y.o. male here today for follow up for OSA on CPAP, RLS and excessive daytime sleepiness. He continues Requip and modafinil. He feels that symptoms are fairly stable. He does continue to note daytime sleepiness and contributes this to multiple factors.  He continues to work on managing diabetes.  He has had multiple hospital visits Baker to hypo-/hyperglycemia.  He is using his CPAP most nights but continues to  struggle with 4-hour compliance.  He continues to note a leak.   Compliance report dated 04/19/2019 through 05/18/2019 reveals that he used CPAP 24 of the past 30 days for compliance of 80%.  He used CPAP greater than 4 hours 14 of the past 30 days for compliance of 47%.  Average usage on days used was 4 hours and 25 minutes.  Residual AHI is 9.6 on 6 to 16 cm of water and an EPR of 3.  There was a significant leak noted in the 93rd percentile of 53.7.   HISTORY: (copied from [place] note on 06/22/2018)  Paul Baker a 69 year old male with a history of obstructive sleep apnea on CPAP. His CPAP download indicates that his machine 18 out of 30 days for compliance of 60%. He is only uses machine greater than 4 hours 10 days for compliance of 33%. On average he uses his machine 4 hours and 4 minutes. His residual AHI is 8.7 on 6 to 16 cm of water with EPR 3. He has a leak in the 95th percentile at 48.9 L/min.He states that he has trouble using the mask as it continuously leaks. He states he has a hard time with the straps staying on his head as well. He recently received new  supplies but he continues to leak. The patient also states that his restless legs has worsened. He states that they are now occurring throughout the day. Starting around 10 AM. He changed the way he takes his medication. He reports that he is taking the Requip extended release and cutting it in half and taking one half in the morning and one half in the evening. He takes Requip extended release a half a tablet in the morning and another half before bedtime. He states that this is not helping his symptoms. He is up throughout the night Baker to restless legs. He has tried gabapentin in the past but this caused a rash. To his knowledge he is not been on any other medication. He joins me today for virtual visit.  HISTORY10/17/19 Paul Baker is a 69 year old male with a history of obstructive sleep apnea on  CPAP.His CPAP download indicates that he uses machine 42 out of 90 days for compliance of 47%. He uses machine greater than 4 hours 29 days for compliance of 32%. On average he uses his machine 4 hours and 56 minutes. His residual AHI is 4.6 on 6 to 16 cm of water with EPR of 3. His leak in the 95th percentile is 30.4 L/min.He reports that he has a hard time using the nasal pillows. He states that often he feels that he is suffocating or the mask comes off during the night. He also continues to have trouble with restless legs. He states that when we switched him to the extended release tablet did offer him benefit for several weeks but now in the last week he has been having more symptoms typically before bedtime and during the night. He returns today for evaluation.    REVIEW OF SYSTEMS: Out of a complete 14 system review of symptoms, the patient complains only of the following symptoms, restless legs, daytime sleepiness, fatigue and all other reviewed systems are negative.  ESS: 15 FSS: 29  ALLERGIES: Allergies  Allergen Reactions  . Tape Rash    Pulls off skin  . Cardizem [Diltiazem Hcl]     Edema   . Cardura [Doxazosin Mesylate]     Headaches / cramps  . Lipitor [Atorvastatin] Other (See Comments)    Leg cramps  . Paxil [Paroxetine Hcl]     Unknown reaction   . Codeine Rash and Other (See Comments)    Headache   . Gabapentin Rash    HOME MEDICATIONS: Outpatient Medications Prior to Visit  Medication Sig Dispense Refill  . albuterol (VENTOLIN HFA) 108 (90 Base) MCG/ACT inhaler Inhale 2 puffs into the lungs every 6 (six) hours as needed for wheezing or shortness of breath. 18 g 5  . alfuzosin (UROXATRAL) 10 MG 24 hr tablet Take 10 mg by mouth daily with breakfast.    . amLODipine (NORVASC) 5 MG tablet TAKE 1 TABLET BY MOUTH ONCE DAILY **DOSE  INCREASE  05/21/2018** (Patient taking differently: Take 5 mg by mouth daily. ) 90 tablet 3  . B-D ULTRAFINE III SHORT PEN 31G X  8 MM MISC USING FIVE TIMES DAILY. 150 each 5  . clopidogrel (PLAVIX) 75 MG tablet Take 1 tablet (75 mg total) by mouth daily. 90 tablet 1  . Continuous Blood Gluc Receiver (DEXCOM G6 RECEIVER) DEVI 1 Piece by Does not apply route as needed. 1 Device 0  . Continuous Blood Gluc Sensor (DEXCOM G6 SENSOR) MISC 4 Pieces by Does not apply route once a week. 4 each 2  . Arna Medici  137 MCG tablet TAKE 1 TABLET BY MOUTH ONCE DAILY BEFORE BREAKFAST 90 tablet 0  . fluticasone furoate-vilanterol (BREO ELLIPTA) 200-25 MCG/INH AEPB Inhale 1 puff into the lungs daily. 30 each 5  . furosemide (LASIX) 40 MG tablet Take 40 mg by mouth. Take 40 mg alternating with 80 mg Daily    . glipiZIDE (GLUCOTROL XL) 5 MG 24 hr tablet Take 1 tablet (5 mg total) by mouth daily with breakfast. 90 tablet 1  . insulin regular human CONCENTRATED (HUMULIN R U-500 KWIKPEN) 500 UNIT/ML kwikpen Inject 60-70 Units into the skin 3 (three) times daily with meals. Per sliding scale. 40 mL 0  . isosorbide mononitrate (IMDUR) 60 MG 24 hr tablet TAKE 1 TABLET BY MOUTH IN THE MORNING AND 1/2 (ONE-HALF) IN THE EVENING 135 tablet 3  . loratadine (CLARITIN) 10 MG tablet Take 10 mg by mouth daily as needed for allergies.     [name] losartan (COZAAR) 50 MG tablet Take 1 tablet (50 mg total) by mouth daily. 90 tablet 0  . metFORMIN (GLUCOPHAGE) 500 MG tablet TAKE 1 TABLET BY MOUTH TWICE DAILY WITH MEALS 180 tablet 0  . metoprolol succinate (TOPROL-XL) 50 MG 24 hr tablet Take 1 tablet by mouth twice daily 180 tablet 3  . nitroGLYCERIN (NITROSTAT) 0.4 MG SL tablet DISSOLVE 1 TABLET UNDER TONGUE EVERY 5 MINUTES UP TO 15 MIN FOR CHESTPAIN. IF NO RELIEF CALL 911. 25 tablet 3  . ONE TOUCH ULTRA TEST test strip TEST BLOOD SUGAR UP TO 4 TIMES DAILY. 150 each 5  . ONETOUCH DELICA LANCETS 58K MISC USE AS DIRECTED TO TEST BLOOD SUGAR 4 TIMES DAILY. 150 each 5  . pravastatin (PRAVACHOL) 40 MG tablet Take 1 tablet (40 mg total) by mouth every evening. 30 tablet 5  .  rOPINIRole (REQUIP) 4 MG tablet Take 1/2 (one-half) tablet by mouth twice daily (Patient taking differently: Take 2 mg by mouth 2 (two) times daily. ) 90 tablet 1  . sodium chloride (OCEAN) 0.65 % SOLN nasal spray Place 1-2 sprays into both nostrils daily as needed for congestion.    . Vitamin D, Ergocalciferol, (DRISDOL) 1.25 MG (50000 UNIT) CAPS capsule Take 1 capsule by mouth once a week 12 capsule 0   No facility-administered medications prior to visit.    PAST MEDICAL HISTORY: Past Medical History:  Diagnosis Date  . Arthritis   . Asthma   . Colon polyp   . Coronary atherosclerosis of native coronary artery    a. 2011: cath showing 90% stenosis along small non-dominant RCA (too small for PCI). b. 01/2018: cath showing nonobstructive CAD with 60 to 70% proximal to mid nondominant RCA stenosis, 50% mid LAD and 40 to 50% OM1  . DVT (deep venous thrombosis) ([place]) 2005   Right arm  . Essential hypertension, benign   . Headache   . History of transfusion   . Hypersomnia    CPAP of 16 cm, diagnosed with AHI of 60 in 2012,[place] 21- narcolepsy?  [name] Hypothyroidism   . Mixed hyperlipidemia   . Morbid obesity ([place])   . MRSA (methicillin resistant staph aureus) culture positive    08/2012  . Narcolepsy   . OSA (obstructive sleep apnea)    CPAP  . Osteoarthritis   . Pneumonia   . Psoriasis   . Pulmonary embolism ([place]) 2004  . RLS (restless legs syndrome)   . Rotator cuff disorder    Left  . Septic arthritis of knee, left ([place])   . Skin  cancer, basal cell   . Type 2 diabetes mellitus ([place])     PAST SURGICAL HISTORY: Past Surgical History:  Procedure Laterality Date  . BACK SURGERY    . CATARACT EXTRACTION Bilateral   . COLONOSCOPY    . CYST REMOVAL TRUNK     from back  . EYE SURGERY Left 2016   laser to left eye  . HIP SURGERY     bone removed from both sides of hip  . KNEE ARTHROTOMY Right 12/04/2014   Procedure: KNEE ARTHROTOMY PATELLA LIGAMENT RECONSTRUSION AND REPAIR  RIGHT KNEE;  Surgeon: Paralee Cancel, MD;  Location: [facility];  Service: Orthopedics;  Laterality: Right;  . KNEE SURGERY     X 25 TIMES  . LEFT HEART CATH AND CORONARY ANGIOGRAPHY N/A 01/17/2018   Procedure: LEFT HEART CATH AND CORONARY ANGIOGRAPHY;  Surgeon: Belva Crome, MD;  Location: [facility];  Service: Cardiovascular;  Laterality: N/A;  . LUMBAR DISC SURGERY     Left L3, L4, L5 discecotomy with decompression of L4 root  . TONSILLECTOMY    . TOTAL KNEE ARTHROPLASTY  2003   LEFT  . TOTAL KNEE ARTHROPLASTY Right 03/23/2014   Procedure: RIGHT TOTAL KNEE ARTHROPLASTY AND REMOVAL RIGHT TIBIAL  DEEP IMPLANT STAPLE;  Surgeon: Paralee Cancel, MD;  Location: [facility];  Service: Orthopedics;  Laterality: Right;  . TOTAL KNEE REVISION  2005   LEFT  . WRIST SURGERY      FAMILY HISTORY: Family History  Problem Relation Age of Onset  . Hypertension Father   . Heart attack Father   . Kidney Stones Father   . Seizures Grandchild   . Narcolepsy Grandchild   . Diabetes Sister     SOCIAL HISTORY: Social History   Socioeconomic History  . Marital status: Married    Spouse name: Toney Reil  . Number of children: 2  . Years of education: college  . Highest education level: Not on file  Occupational History  . Occupation: Disabled    Employer: UNEMPLOYED  Tobacco Use  . Smoking status: Former Smoker    Packs/day: 1.50    Years: 10.00    Pack years: 15.00    Types: Cigarettes    Start date: 06/19/1967    Quit date: 01/03/1995    Years since quitting: 24.6  . Smokeless tobacco: Never Used  Vaping Use  . Vaping Use: Never used  Substance and Sexual Activity  . Alcohol use: No    Alcohol/week: 0.0 standard drinks    Comment: quit drinking in 07/86  . Drug use: No  . Sexual activity: Yes    Partners: Female  Other Topics Concern  . Not on file  Social History Narrative    69 year old, right-handed, caucasian male with a past medical history of obesity, hypertension, hyperlipidemia,  diabetes, obstructive sleep apnea, presenting with frequent nighttime awakenings, excessive daytime sleepiness, also transient confusional episodes.RLS and one beosity, OSA on CPAP with AHI of 3.2 and  setting of 16 cm water , [facility] .   Social Determinants of Health   Financial Resource Strain:   . Difficulty of Paying Living Expenses: Not on file  Food Insecurity:   . Worried About Charity fundraiser in the Last Year: Not on file  . Ran Out of Food in the Last Year: Not on file  Transportation Needs:   . Lack of Transportation (Medical): Not on file  . Lack of Transportation (Non-Medical): Not on file  Physical Activity:   .  Days of Exercise per Week: Not on file  . Minutes of Exercise per Session: Not on file  Stress:   . Feeling of Stress : Not on file  Social Connections:   . Frequency of Communication with Friends and Family: Not on file  . Frequency of Social Gatherings with Friends and Family: Not on file  . Attends Religious Services: Not on file  . Active Member of Clubs or Organizations: Not on file  . Attends Archivist Meetings: Not on file  . Marital Status: Not on file  Intimate Partner Violence:   . Fear of Current or Ex-Partner: Not on file  . Emotionally Abused: Not on file  . Physically Abused: Not on file  . Sexually Abused: Not on file      PHYSICAL EXAM  Vitals:   08/26/19 0734  BP: 134/80  Pulse: 64  Weight: 217 lb 9.6 oz (98.7 kg)  Height: 5\' 5"  (1.651 m)   Body mass index is 36.21 kg/m.  Generalized: Well developed, in no acute distress  Cardiology: normal rate and rhythm, no murmur noted Respiratory: clear to auscultation bilaterally  Neurological examination  Mentation: Alert oriented to time, place, history taking. Follows all commands speech and language fluent Cranial nerve II-XII: Pupils were equal round reactive to light. Extraocular movements were full, visual field were full  Motor: The motor testing reveals 5  over 5 strength of all 4 extremities. Good symmetric motor tone is noted throughout.  Gait and station: Gait is normal.   DIAGNOSTIC DATA (LABS, IMAGING, TESTING) - I reviewed patient records, labs, notes, testing and imaging myself where available.  No flowsheet data found.   Lab Results  Component Value Date   WBC 8.5 08/05/2019   HGB 12.5 (L) 08/05/2019   HCT 39.5 08/05/2019   MCV 91.9 08/05/2019   PLT 241 08/05/2019      Component Value Date/Time   NA 135 08/05/2019 0334   NA 138 09/11/2018 0949   K 4.2 08/05/2019 0334   CL 102 08/05/2019 0334   CO2 25 08/05/2019 0334   GLUCOSE 336 (H) 08/05/2019 0334   BUN 22 08/05/2019 0334   BUN 22 09/11/2018 0949   CREATININE 0.94 08/05/2019 0334   CREATININE 1.11 02/06/2019 0813   CALCIUM 9.0 08/05/2019 0334   PROT 6.9 08/05/2019 0334   PROT 6.9 09/11/2018 0949   ALBUMIN 3.8 08/05/2019 0334   ALBUMIN 4.6 09/11/2018 0949   AST 15 08/05/2019 0334   ALT 18 08/05/2019 0334   ALKPHOS 52 08/05/2019 0334   BILITOT 0.4 08/05/2019 0334   BILITOT 0.3 09/11/2018 0949   GFRNONAA >60 08/05/2019 0334   GFRNONAA 68 02/06/2019 0813   GFRAA >60 08/05/2019 0334   GFRAA 79 02/06/2019 0813   Lab Results  Component Value Date   CHOL 177 08/05/2019   HDL 56 08/05/2019   LDLCALC 102 (H) 08/05/2019   TRIG 95 08/05/2019   CHOLHDL 3.2 08/05/2019   Lab Results  Component Value Date   HGBA1C 9.6 (A) 08/01/2019   No results found for: VITAMINB12 Lab Results  Component Value Date   TSH 1.43 02/06/2019       ASSESSMENT AND PLAN 69 y.o. year old male  has a past medical history of Arthritis, Asthma, Colon polyp, Coronary atherosclerosis of native coronary artery, DVT (deep venous thrombosis) ([place]) (2005), Essential hypertension, benign, Headache, History of transfusion, Hypersomnia, Hypothyroidism, Mixed hyperlipidemia, Morbid obesity ([place]), MRSA (methicillin resistant staph aureus) culture positive, Narcolepsy, OSA (obstructive sleep  apnea), Osteoarthritis, Pneumonia, Psoriasis, Pulmonary embolism ([place]) (2004), RLS (restless legs syndrome), Rotator cuff disorder, Septic arthritis of knee, left ([place]), Skin cancer, basal cell, and Type 2 diabetes mellitus ([place]). here with     ICD-10-CM   1. OSA on CPAP  G47.33    Z99.89   2. RLS (restless legs syndrome)  G25.81   3. Excessive daytime sleepiness  G47.19     Mr. Carchi continues to struggle with CPAP compliance.  We have had a lengthy discussion regarding need for CPAP usage specifically in relationship to his comorbidities.  He does verbalize desire to continue using CPAP at home.  He states that he will [facility] to schedule a time for them to look at his machine.  He will also communicate any concerns with his mask if needed.  We have reviewed insurance requirements for CPAP compliance, however, I have encouraged him to use CPAP every night and for a minimum of 4 hours each night.  He will continue to work with primary care for stroke prevention.  He will follow-up with me in 3 months, sooner if needed.  He verbalizes understanding and agreement with this plan.   No orders of the defined types were placed in this encounter.    No orders of the defined types were placed in this encounter.     I spent 15 minutes with the patient. 50% of this time was spent counseling and educating patient on plan of care and medications.    Debbora Presto, FNP-C 08/26/2019, 7:43 AM [facility] [address] [place], [place] [address] [phone]

## 2019-08-26 ENCOUNTER — Encounter: Payer: Self-pay | Admitting: Family Medicine

## 2019-08-26 ENCOUNTER — Other Ambulatory Visit: Payer: Self-pay

## 2019-08-26 ENCOUNTER — Ambulatory Visit (INDEPENDENT_AMBULATORY_CARE_PROVIDER_SITE_OTHER): Payer: Medicare HMO | Admitting: Family Medicine

## 2019-08-26 VITALS — BP 134/80 | HR 64 | Ht 65.0 in | Wt 217.6 lb

## 2019-08-26 DIAGNOSIS — Z9989 Dependence on other enabling machines and devices: Secondary | ICD-10-CM | POA: Diagnosis not present

## 2019-08-26 DIAGNOSIS — G4733 Obstructive sleep apnea (adult) (pediatric): Secondary | ICD-10-CM

## 2019-08-26 DIAGNOSIS — G4719 Other hypersomnia: Secondary | ICD-10-CM

## 2019-08-26 DIAGNOSIS — G2581 Restless legs syndrome: Secondary | ICD-10-CM

## 2019-08-26 NOTE — Patient Instructions (Signed)
Please take CPAP machine to [facility] as directed to troubleshoot any issues. Continue Requip and modafinil as prescribed.   Follow up in 3 months    Living With Sleep Apnea Sleep apnea is a condition in which breathing pauses or becomes shallow during sleep. Sleep apnea is most commonly caused by a collapsed or blocked airway. People with sleep apnea snore loudly and have times when they gasp and stop breathing for 10 seconds or more during sleep. This happens over and over during the night. This disrupts your sleep and keeps your body from getting the rest that it needs, which can cause tiredness and lack of energy (fatigue) during the day. The breaks in breathing also interrupt the deep sleep that you need to feel rested. Even if you do not completely wake up from the gaps in breathing, your sleep may not be restful. You may also have a headache in the morning and low energy during the day, and you may feel anxious or depressed. How can sleep apnea affect me? Sleep apnea increases your chances of extreme tiredness during the day (daytime fatigue). It can also increase your risk for health conditions, such as:  Heart attack.  Stroke.  Diabetes.  Heart failure.  Irregular heartbeat.  High blood pressure. If you have daytime fatigue as a result of sleep apnea, you may be more likely to:  Perform poorly at school or work.  Fall asleep while driving.  Have difficulty with attention.  Develop depression or anxiety.  Become severely overweight (obese).  Have sexual dysfunction. What actions can I take to manage sleep apnea? Sleep apnea treatment   If you were given a device to open your airway while you sleep, use it only as told by your health care provider. You may be given: ? An oral appliance. This is a custom-made mouthpiece that shifts your lower jaw forward. ? A continuous positive airway pressure (CPAP) device. This device blows air through a mask when you breathe out  (exhale). ? A nasal expiratory positive airway pressure (EPAP) device. This device has valves that you put into each nostril. ? A bi-level positive airway pressure (BPAP) device. This device blows air through a mask when you breathe in (inhale) and breathe out (exhale).  You may need surgery if other treatments do not work for you. Sleep habits  Go to sleep and wake up at the same time every day. This helps set your internal clock (circadian rhythm) for sleeping. ? If you stay up later than usual, such as on weekends, try to get up in the morning within 2 hours of your normal wake time.  Try to get at least 7-9 hours of sleep each night.  Stop computer, tablet, and mobile phone use a few hours before bedtime.  Do not take long naps during the day. If you nap, limit it to 30 minutes.  Have a relaxing bedtime routine. Reading or listening to music may relax you and help you sleep.  Use your bedroom only for sleep. ? Keep your television and computer out of your bedroom. ? Keep your bedroom cool, dark, and quiet. ? Use a supportive mattress and pillows.  Follow your health care provider's instructions for other changes to sleep habits. Nutrition  Do not eat heavy meals in the evening.  Do not have caffeine in the later part of the day. The effects of caffeine can last for more than 5 hours.  Follow your health care provider's or dietitian's instructions for any  diet changes. Lifestyle      Do not drink alcohol before bedtime. Alcohol can cause you to fall asleep at first, but then it can cause you to wake up in the middle of the night and have trouble getting back to sleep.  Do not use any products that contain nicotine or tobacco, such as cigarettes and e-cigarettes. If you need help quitting, ask your health care provider. Medicines  Take over-the-counter and prescription medicines only as told by your health care provider.  Do not use over-the-counter sleep medicine. You  can become dependent on this medicine, and it can make sleep apnea worse.  Do not use medicines, such as sedatives and narcotics, unless told by your health care provider. Activity  Exercise on most days, but avoid exercising in the evening. Exercising near bedtime can interfere with sleeping.  If possible, spend time outside every day. Natural light helps regulate your circadian rhythm. General information  Lose weight if you need to, and maintain a healthy weight.  Keep all follow-up visits as told by your health care provider. This is important.  If you are having surgery, make sure to tell your health care provider that you have sleep apnea. You may need to bring your device with you. Where to find more information Learn more about sleep apnea and daytime fatigue from:  American Sleep Association: sleepassociation.[place]: sleepfoundation.org  National Heart, Lung, and Blood Institute: https://www.[place].biz/ Summary  Sleep apnea can cause daytime fatigue and other serious health conditions.  Both sleep apnea and daytime fatigue can be bad for your health and well-being.  You may need to wear a device while sleeping to help keep your airway open.  If you are having surgery, make sure to tell your health care provider that you have sleep apnea. You may need to bring your device with you.  Making changes to sleep habits, diet, lifestyle, and activity can help you manage sleep apnea. This information is not intended to replace advice given to you by your health care provider. Make sure you discuss any questions you have with your health care provider. Document Revised: 04/12/2018 Document Reviewed: 03/15/2017 Elsevier Patient Education  [place].

## 2019-09-11 DIAGNOSIS — E1110 Type 2 diabetes mellitus with ketoacidosis without coma: Secondary | ICD-10-CM | POA: Diagnosis not present

## 2019-09-12 DIAGNOSIS — E1122 Type 2 diabetes mellitus with diabetic chronic kidney disease: Secondary | ICD-10-CM | POA: Diagnosis not present

## 2019-09-12 DIAGNOSIS — Z6836 Body mass index (BMI) 36.0-36.9, adult: Secondary | ICD-10-CM | POA: Diagnosis not present

## 2019-09-12 DIAGNOSIS — I129 Hypertensive chronic kidney disease with stage 1 through stage 4 chronic kidney disease, or unspecified chronic kidney disease: Secondary | ICD-10-CM | POA: Diagnosis not present

## 2019-09-12 DIAGNOSIS — Z23 Encounter for immunization: Secondary | ICD-10-CM | POA: Diagnosis not present

## 2019-09-12 DIAGNOSIS — R42 Dizziness and giddiness: Secondary | ICD-10-CM | POA: Diagnosis not present

## 2019-09-12 DIAGNOSIS — N182 Chronic kidney disease, stage 2 (mild): Secondary | ICD-10-CM | POA: Diagnosis not present

## 2019-10-06 ENCOUNTER — Telehealth: Payer: Self-pay | Admitting: "Endocrinology

## 2019-10-06 NOTE — Telephone Encounter (Signed)
Form sent to [facility] with new dosages.

## 2019-10-06 NOTE — Telephone Encounter (Signed)
Pt called and states that [facility] that he gets his insulin from needs to be informed that his insulin is now Humalin 80 - 80 and 70.

## 2019-10-09 DIAGNOSIS — E1110 Type 2 diabetes mellitus with ketoacidosis without coma: Secondary | ICD-10-CM | POA: Diagnosis not present

## 2019-10-10 ENCOUNTER — Other Ambulatory Visit: Payer: Self-pay | Admitting: "Endocrinology

## 2019-10-10 ENCOUNTER — Other Ambulatory Visit: Payer: Self-pay | Admitting: Neurology

## 2019-10-10 DIAGNOSIS — G4733 Obstructive sleep apnea (adult) (pediatric): Secondary | ICD-10-CM | POA: Diagnosis not present

## 2019-10-13 DIAGNOSIS — E1110 Type 2 diabetes mellitus with ketoacidosis without coma: Secondary | ICD-10-CM | POA: Diagnosis not present

## 2019-10-17 ENCOUNTER — Other Ambulatory Visit: Payer: Self-pay | Admitting: *Deleted

## 2019-10-17 ENCOUNTER — Telehealth: Payer: Self-pay | Admitting: Family Medicine

## 2019-10-17 MED ORDER — PRAVASTATIN SODIUM 40 MG PO TABS: 40.0000 mg | ORAL_TABLET | Freq: Every evening | ORAL | 3 refills | Status: DC

## 2019-10-17 NOTE — Telephone Encounter (Signed)
[facility] requesting refill on Pravastatin 40 mg tablet. Take one tablet po in the evening. Pt last seen in August. Please advise. Thank you

## 2019-10-20 ENCOUNTER — Other Ambulatory Visit: Payer: Self-pay

## 2019-10-20 DIAGNOSIS — E1159 Type 2 diabetes mellitus with other circulatory complications: Secondary | ICD-10-CM

## 2019-10-20 MED ORDER — HUMULIN R U-500 KWIKPEN 500 UNIT/ML ~~LOC~~ SOPN: PEN_INJECTOR | SUBCUTANEOUS | 0 refills | Status: DC

## 2019-10-20 MED ORDER — PRAVASTATIN SODIUM 40 MG PO TABS: 40.0000 mg | ORAL_TABLET | Freq: Every evening | ORAL | 1 refills | Status: DC

## 2019-10-20 NOTE — Telephone Encounter (Signed)
Pt stopped by to say that [facility] is needing a new RX now. Once that is called in they can ship it out.   He is down to one pen now.

## 2019-10-20 NOTE — Addendum Note (Signed)
Addended by: Erven Colla on: 10/20/2019 10:58 AM   Modules accepted: Orders

## 2019-10-20 NOTE — Telephone Encounter (Signed)
Faxed new Rx for Humulin R U-500 70-80 units three times daily with meals to [facility].

## 2019-10-21 ENCOUNTER — Telehealth: Payer: Self-pay

## 2019-10-21 NOTE — Telephone Encounter (Signed)
Talked with the [facility] representative that stated they did receive the new Rx for pt's Humulin R U-500 70-80 units three times daily with meals. She did process the prescription and stated she did not need any other information.

## 2019-11-10 ENCOUNTER — Other Ambulatory Visit: Payer: Self-pay | Admitting: "Endocrinology

## 2019-11-10 DIAGNOSIS — G4733 Obstructive sleep apnea (adult) (pediatric): Secondary | ICD-10-CM | POA: Diagnosis not present

## 2019-11-11 NOTE — Telephone Encounter (Signed)
Please advise on Vitamin D

## 2019-11-12 DIAGNOSIS — E1110 Type 2 diabetes mellitus with ketoacidosis without coma: Secondary | ICD-10-CM | POA: Diagnosis not present

## 2019-11-18 ENCOUNTER — Ambulatory Visit (INDEPENDENT_AMBULATORY_CARE_PROVIDER_SITE_OTHER): Payer: Medicare HMO | Admitting: Family Medicine

## 2019-11-18 ENCOUNTER — Telehealth: Payer: Self-pay

## 2019-11-18 ENCOUNTER — Encounter: Payer: Self-pay | Admitting: Family Medicine

## 2019-11-18 ENCOUNTER — Other Ambulatory Visit: Payer: Self-pay

## 2019-11-18 VITALS — HR 82 | Temp 98.8°F | Resp 16

## 2019-11-18 DIAGNOSIS — J019 Acute sinusitis, unspecified: Secondary | ICD-10-CM | POA: Diagnosis not present

## 2019-11-18 DIAGNOSIS — R059 Cough, unspecified: Secondary | ICD-10-CM | POA: Diagnosis not present

## 2019-11-18 DIAGNOSIS — B9689 Other specified bacterial agents as the cause of diseases classified elsewhere: Secondary | ICD-10-CM | POA: Diagnosis not present

## 2019-11-18 MED ORDER — AMOXICILLIN-POT CLAVULANATE 875-125 MG PO TABS: 1.0000 | ORAL_TABLET | Freq: Two times a day (BID) | ORAL | 0 refills | Status: DC

## 2019-11-18 NOTE — Telephone Encounter (Signed)
Returned call to patient and advised, verbalized understanding 

## 2019-11-18 NOTE — Telephone Encounter (Signed)
Dehydration probably kept him high yesterday unless it was a food contamination on his finger tips. He can increase his U500 to 85 at BKF, 85, at Lunch,  and 75 at supper for better control.

## 2019-11-18 NOTE — Progress Notes (Signed)
Patient ID: Paul Baker, male    DOB: 1950/03/16, 69 y.o.   MRN: [id number]   Chief Complaint  Patient presents with  . Sinus Problem    for a week- cough , congestion -NO fever-    Subjective:  CC:  Sinuses"   Presents today for an outside visit with the complaint of "sinuses ".  Associated symptoms include cough and congestion.  Reports that his blood sugar was 550 last night so he feels like he has an infection,(blood sugar under much better control today),  he has a history of asthma and he feels that that is the reason for his cough.  He denies any fever but has a headache around his eyes and his congestion is in his head and his nose.  He denies ear pain abdominal pain no nausea vomiting has had diarrhea.    Medical History Paul Baker has a past medical history of Arthritis, Asthma, Colon polyp, Coronary atherosclerosis of native coronary artery, DVT (deep venous thrombosis) ([place]) (2005), Essential hypertension, benign, Headache, History of transfusion, Hypersomnia, Hypothyroidism, Mixed hyperlipidemia, Morbid obesity ([place]), MRSA (methicillin resistant staph aureus) culture positive, Narcolepsy, OSA (obstructive sleep apnea), Osteoarthritis, Pneumonia, Psoriasis, Pulmonary embolism ([place]) (2004), RLS (restless legs syndrome), Rotator cuff disorder, Septic arthritis of knee, left ([place]), Skin cancer, basal cell, and Type 2 diabetes mellitus ([place]).   Outpatient Encounter Medications as of 11/18/2019  Medication Sig  . modafinil (PROVIGIL) 200 MG tablet TAKE ONE TABLET BY MOUTH ONE TIME DAILY  . Vitamin D, Ergocalciferol, (DRISDOL) 1.25 MG (50000 UNIT) CAPS capsule Take 1 capsule by mouth once a week  . albuterol (VENTOLIN HFA) 108 (90 Base) MCG/ACT inhaler Inhale 2 puffs into the lungs every 6 (six) hours as needed for wheezing or shortness of breath.  . alfuzosin (UROXATRAL) 10 MG 24 hr tablet Take 10 mg by mouth daily with breakfast.  . amLODipine (NORVASC) 5 MG tablet TAKE 1  TABLET BY MOUTH ONCE DAILY **DOSE  INCREASE  05/21/2018** (Patient taking differently: Take 5 mg by mouth daily. )  . amoxicillin-clavulanate (AUGMENTIN) 875-125 MG tablet Take 1 tablet by mouth 2 (two) times daily.  . B-D ULTRAFINE III SHORT PEN 31G X 8 MM MISC USING FIVE TIMES DAILY.  [name] clopidogrel (PLAVIX) 75 MG tablet Take 1 tablet (75 mg total) by mouth daily.  . Continuous Blood Gluc Receiver (DEXCOM G6 RECEIVER) DEVI 1 Piece by Does not apply route as needed.  . Continuous Blood Gluc Sensor (DEXCOM G6 SENSOR) MISC 4 Pieces by Does not apply route once a week.  Arna Medici 137 MCG tablet TAKE 1 TABLET BY MOUTH ONCE DAILY BEFORE BREAKFAST  . fluticasone furoate-vilanterol (BREO ELLIPTA) 200-25 MCG/INH AEPB Inhale 1 puff into the lungs daily.  . furosemide (LASIX) 40 MG tablet Take 40 mg by mouth. Take 40 mg alternating with 80 mg Daily  . glipiZIDE (GLUCOTROL XL) 5 MG 24 hr tablet Take 1 tablet (5 mg total) by mouth daily with breakfast.  . insulin regular human CONCENTRATED (HUMULIN R U-500 KWIKPEN) 500 UNIT/ML kwikpen Inject 70-80 units into the skin three times daily with meals per sliding scale.  . isosorbide mononitrate (IMDUR) 60 MG 24 hr tablet TAKE 1 TABLET BY MOUTH IN THE MORNING AND 1/2 (ONE-HALF) IN THE EVENING  . loratadine (CLARITIN) 10 MG tablet Take 10 mg by mouth daily as needed for allergies.   [name] losartan (COZAAR) 50 MG tablet Take 1 tablet (50 mg total) by mouth daily.  . metFORMIN (GLUCOPHAGE)  500 MG tablet TAKE 1 TABLET BY MOUTH TWICE DAILY WITH MEALS  . metoprolol succinate (TOPROL-XL) 50 MG 24 hr tablet Take 1 tablet by mouth twice daily  . nitroGLYCERIN (NITROSTAT) 0.4 MG SL tablet DISSOLVE 1 TABLET UNDER TONGUE EVERY 5 MINUTES UP TO 15 MIN FOR CHESTPAIN. IF NO RELIEF CALL 911.  [name] ONE TOUCH ULTRA TEST test strip TEST BLOOD SUGAR UP TO 4 TIMES DAILY.  [name] ONETOUCH DELICA LANCETS 44R MISC USE AS DIRECTED TO TEST BLOOD SUGAR 4 TIMES DAILY.  . pravastatin (PRAVACHOL) 40 MG  tablet Take 1 tablet (40 mg total) by mouth every evening.  [name] rOPINIRole (REQUIP) 4 MG tablet Take 1/2 (one-half) tablet by mouth twice daily (Patient taking differently: Take 2 mg by mouth 2 (two) times daily. )  . sodium chloride (OCEAN) 0.65 % SOLN nasal spray Place 1-2 sprays into both nostrils daily as needed for congestion.   No facility-administered encounter medications on file as of 11/18/2019.     Review of Systems  Constitutional: Negative for chills and fever.  HENT: Positive for congestion, rhinorrhea, sinus pressure and sinus pain. Negative for ear pain and sore throat.   Respiratory: Negative for shortness of breath.   Cardiovascular: Negative for chest pain.  Gastrointestinal: Negative for abdominal pain.  Neurological: Positive for headaches.  Hematological: Positive for adenopathy.     Vitals Pulse 82   Temp 98.8 F (37.1 C)   Resp 16   SpO2 95%   Objective:   Physical Exam Vitals and nursing note reviewed.  Constitutional:      General: He is not in acute distress.    Appearance: Normal appearance. He is not ill-appearing.  HENT:     Right Ear: Tympanic membrane normal.     Left Ear: Tympanic membrane normal.     Nose:     Right Sinus: Maxillary sinus tenderness and frontal sinus tenderness present.     Left Sinus: Maxillary sinus tenderness and frontal sinus tenderness present.     Mouth/Throat:     Mouth: Mucous membranes are moist.     Pharynx: Posterior oropharyngeal erythema present.  Cardiovascular:     Rate and Rhythm: Normal rate and regular rhythm.     Heart sounds: Normal heart sounds.  Pulmonary:     Effort: Pulmonary effort is normal.     Breath sounds: Normal breath sounds. No wheezing.  Skin:    General: Skin is warm and dry.  Neurological:     Mental Status: He is alert and oriented to person, place, and time.  Psychiatric:        Mood and Affect: Mood normal.        Behavior: Behavior normal.      Assessment and Plan   1.  Cough - Novel Coronavirus, NAA ([facility])  2. Acute bacterial rhinosinusitis - amoxicillin-clavulanate (AUGMENTIN) 875-125 MG tablet; Take 1 tablet by mouth 2 (two) times daily.  Dispense: 20 tablet; Refill: 0   Has significant maxillary and frontal sinus pain upon palpation.  Will treat for acute bacterial rhinosinusitis.  He is instructed to keep an eye on his blood sugars, and due to his asthma if he develops any shortness of breath he is instructed to go to the emergency department.  His lungs were clear today upon auscultation.  Agrees with plan of care discussed today. Understands warning signs to seek further care: Chest pain, shortness of breath, any significant changes in health status. Understands to follow-up if symptoms do not improve, or  worsen.  We will notify him once the Covid results become available.  He reports no known Covid exposures.

## 2019-11-20 LAB — NOVEL CORONAVIRUS, NAA: SARS-CoV-2, NAA: NOT DETECTED

## 2019-11-20 LAB — SPECIMEN STATUS REPORT

## 2019-11-20 LAB — SARS-COV-2, NAA 2 DAY TAT

## 2019-11-26 ENCOUNTER — Ambulatory Visit: Payer: Medicare HMO | Admitting: Family Medicine

## 2019-11-26 ENCOUNTER — Encounter: Payer: Self-pay | Admitting: Family Medicine

## 2019-11-26 VITALS — BP 147/72 | HR 72 | Ht 65.0 in | Wt 229.0 lb

## 2019-11-26 DIAGNOSIS — Z9989 Dependence on other enabling machines and devices: Secondary | ICD-10-CM

## 2019-11-26 DIAGNOSIS — G4733 Obstructive sleep apnea (adult) (pediatric): Secondary | ICD-10-CM | POA: Diagnosis not present

## 2019-11-26 NOTE — Patient Instructions (Signed)
Please continue using your CPAP regularly. While your insurance requires that you use CPAP at least 4 hours each night on 70% of the nights, I recommend, that you not skip any nights and use it throughout the night if you can. Getting used to CPAP and staying with the treatment long term does take time and patience and discipline. Untreated obstructive sleep apnea when it is moderate to severe can have an adverse impact on cardiovascular health and raise her risk for heart disease, arrhythmias, hypertension, congestive heart failure, stroke and diabetes. Untreated obstructive sleep apnea causes sleep disruption, nonrestorative sleep, and sleep deprivation. This can have an impact on your day to day functioning and cause daytime sleepiness and impairment of cognitive function, memory loss, mood disturbance, and problems focussing. Using CPAP regularly can improve these symptoms.   We will send orders to the DME company for a mask refitting. They should be calling you in the next week or so. Let me know if you do not hear back from them. I will reprint download in 6-8 weeks and will call you with a report. Continue close follow up with PCP and endocrinology for management of co morbidities.   Follow up in 6 months   Fatigue If you have fatigue, you feel tired all the time and have a lack of energy or a lack of motivation. Fatigue may make it difficult to start or complete tasks because of exhaustion. In general, occasional or mild fatigue is often a normal response to activity or life. However, long-lasting (chronic) or extreme fatigue may be a symptom of a medical condition. Follow these instructions at home: General instructions  Watch your fatigue for any changes.  Go to bed and get up at the same time every day.  Avoid fatigue by pacing yourself during the day and getting enough sleep at night.  Maintain a healthy weight. Medicines  Take over-the-counter and prescription medicines only as  told by your health care provider.  Take a multivitamin, if told by your health care provider.  Do not use herbal or dietary supplements unless they are approved by your health care provider. Activity   Exercise regularly, as told by your health care provider.  Use or practice techniques to help you relax, such as yoga, tai chi, meditation, or massage therapy. Eating and drinking   Avoid heavy meals in the evening.  Eat a well-balanced diet, which includes lean proteins, whole grains, plenty of fruits and vegetables, and low-fat dairy products.  Avoid consuming too much caffeine.  Avoid the use of alcohol.  Drink enough fluid to keep your urine pale yellow. Lifestyle  Change situations that cause you stress. Try to keep your work and personal schedule in balance.  Do not use any products that contain nicotine or tobacco, such as cigarettes and e-cigarettes. If you need help quitting, ask your health care provider.  Do not use drugs. Contact a health care provider if:  Your fatigue does not get better.  You have a fever.  You suddenly lose or gain weight.  You have headaches.  You have trouble falling asleep or sleeping through the night.  You feel angry, guilty, anxious, or sad.  You are unable to have a bowel movement (constipation).  Your skin is dry.  You have swelling in your legs or another part of your body. Get help right away if:  You feel confused.  Your vision is blurry.  You feel faint or you pass out.  You have a  severe headache.  You have severe pain in your abdomen, your back, or the area between your waist and hips (pelvis).  You have chest pain, shortness of breath, or an irregular or fast heartbeat.  You are unable to urinate, or you urinate less than normal.  You have abnormal bleeding, such as bleeding from the rectum, vagina, nose, lungs, or nipples.  You vomit blood.  You have thoughts about hurting yourself or others. If you  ever feel like you may hurt yourself or others, or have thoughts about taking your own life, get help right away. You can go to your nearest emergency department or call:  Your local emergency services (911 in the [place]  A suicide crisis helpline, such as the [place] at 8024362757. This is open 24 hours a day. Summary  If you have fatigue, you feel tired all the time and have a lack of energy or a lack of motivation.  Fatigue may make it difficult to start or complete tasks because of exhaustion.  Long-lasting (chronic) or extreme fatigue may be a symptom of a medical condition.  Exercise regularly, as told by your health care provider.  Change situations that cause you stress. Try to keep your work and personal schedule in balance. This information is not intended to replace advice given to you by your health care provider. Make sure you discuss any questions you have with your health care provider. Document Revised: 07/10/2018 Document Reviewed: 09/13/2016 Elsevier Patient Education  2020 [place] Sleep Information, Adult Quality sleep is important for your mental and physical health. It also improves your quality of life. Quality sleep means you:  Are asleep for most of the time you are in bed.  Fall asleep within 30 minutes.  Wake up no more than once a night.  Are awake for no longer than 20 minutes if you do wake up during the night. Most adults need 7-8 hours of quality sleep each night. How can poor sleep affect me? If you do not get enough quality sleep, you may have:  Mood swings.  Daytime sleepiness.  Confusion.  Decreased reaction time.  Sleep disorders, such as insomnia and sleep apnea.  Difficulty with: ? Solving problems. ? Coping with stress. ? Paying attention. These issues may affect your performance and productivity at work, school, and at home. Lack of sleep may also put you at higher risk for  accidents, suicide, and risky behaviors. If you do not get quality sleep you may also be at higher risk for several health problems, including:  Infections.  Type 2 diabetes.  Heart disease.  High blood pressure.  Obesity.  Worsening of long-term conditions, like arthritis, kidney disease, depression, Parkinson's disease, and epilepsy. What actions can I take to get more quality sleep?      Stick to a sleep schedule. Go to sleep and wake up at about the same time each day. Do not try to sleep less on weekdays and make up for lost sleep on weekends. This does not work.  Try to get about 30 minutes of exercise on most days. Do not exercise 2-3 hours before going to bed.  Limit naps during the day to 30 minutes or less.  Do not use any products that contain nicotine or tobacco, such as cigarettes or e-cigarettes. If you need help quitting, ask your health care provider.  Do not drink caffeinated beverages for at least 8 hours before going to bed. Coffee, tea,  and some sodas contain caffeine.  Do not drink alcohol close to bedtime.  Do not eat large meals close to bedtime.  Do not take naps in the late afternoon.  Try to get at least 30 minutes of sunlight every day. Morning sunlight is best.  Make time to relax before bed. Reading, listening to music, or taking a hot bath promotes quality sleep.  Make your bedroom a place that promotes quality sleep. Keep your bedroom dark, quiet, and at a comfortable room temperature. Make sure your bed is comfortable. Take out sleep distractions like TV, a computer, smartphone, and bright lights.  If you are lying awake in bed for longer than 20 minutes, get up and do a relaxing activity until you feel sleepy.  Work with your health care provider to treat medical conditions that may affect sleeping, such as: ? Nasal obstruction. ? Snoring. ? Sleep apnea and other sleep disorders.  Talk to your health care provider if you think any of  your prescription medicines may cause you to have difficulty falling or staying asleep.  If you have sleep problems, talk with a sleep consultant. If you think you have a sleep disorder, talk with your health care provider about getting evaluated by a specialist. Where to find more information  [place] website: https://sleepfoundation.org  National Heart, Lung, and [place] ([place]): http://www.saunders.info/.pdf  Centers for Disease Control and Prevention (CDC): LearningDermatology.pl Contact a health care provider if you:  Have trouble getting to sleep or staying asleep.  Often wake up very early in the morning and cannot get back to sleep.  Have daytime sleepiness.  Have daytime sleep attacks of suddenly falling asleep and sudden muscle weakness (narcolepsy).  Have a tingling sensation in your legs with a strong urge to move your legs (restless legs syndrome).  Stop breathing briefly during sleep (sleep apnea).  Think you have a sleep disorder or are taking a medicine that is affecting your quality of sleep. Summary  Most adults need 7-8 hours of quality sleep each night.  Getting enough quality sleep is an important part of health and well-being.  Make your bedroom a place that promotes quality sleep and avoid things that may cause you to have poor sleep, such as alcohol, caffeine, smoking, and large meals.  Talk to your health care provider if you have trouble falling asleep or staying asleep. This information is not intended to replace advice given to you by your health care provider. Make sure you discuss any questions you have with your health care provider. Document Revised: 03/28/2017 Document Reviewed: 03/28/2017 Elsevier Patient Education  [place].   Sleep Apnea Sleep apnea affects breathing during sleep. It causes breathing to stop for a short time or to become shallow. It can also increase the  risk of:  Heart attack.  Stroke.  Being very overweight (obese).  Diabetes.  Heart failure.  Irregular heartbeat. The goal of treatment is to help you breathe normally again. What are the causes? There are three kinds of sleep apnea:  Obstructive sleep apnea. This is caused by a blocked or collapsed airway.  Central sleep apnea. This happens when the brain does not send the right signals to the muscles that control breathing.  Mixed sleep apnea. This is a combination of obstructive and central sleep apnea. The most common cause of this condition is a collapsed or blocked airway. This can happen if:  Your throat muscles are too relaxed.  Your tongue and tonsils are  too large.  You are overweight.  Your airway is too small. What increases the risk?  Being overweight.  Smoking.  Having a small airway.  Being older.  Being male.  Drinking alcohol.  Taking medicines to calm yourself (sedatives or tranquilizers).  Having family members with the condition. What are the signs or symptoms?  Trouble staying asleep.  Being sleepy or tired during the day.  Getting angry a lot.  Loud snoring.  Headaches in the morning.  Not being able to focus your mind (concentrate).  Forgetting things.  Less interest in sex.  Mood swings.  Personality changes.  Feelings of sadness (depression).  Waking up a lot during the night to pee (urinate).  Dry mouth.  Sore throat. How is this diagnosed?  Your medical history.  A physical exam.  A test that is done when you are sleeping (sleep study). The test is most often done in a sleep lab but may also be done at home. How is this treated?   Sleeping on your side.  Using a medicine to get rid of mucus in your nose (decongestant).  Avoiding the use of alcohol, medicines to help you relax, or certain pain medicines (narcotics).  Losing weight, if needed.  Changing your diet.  Not smoking.  Using a machine  to open your airway while you sleep, such as: ? An oral appliance. This is a mouthpiece that shifts your lower jaw forward. ? A CPAP device. This device blows air through a mask when you breathe out (exhale). ? An EPAP device. This has valves that you put in each nostril. ? A BPAP device. This device blows air through a mask when you breathe in (inhale) and breathe out.  Having surgery if other treatments do not work. It is important to get treatment for sleep apnea. Without treatment, it can lead to:  High blood pressure.  Coronary artery disease.  In men, not being able to have an erection (impotence).  Reduced thinking ability. Follow these instructions at home: Lifestyle  Make changes that your doctor recommends.  Eat a healthy diet.  Lose weight if needed.  Avoid alcohol, medicines to help you relax, and some pain medicines.  Do not use any products that contain nicotine or tobacco, such as cigarettes, e-cigarettes, and chewing tobacco. If you need help quitting, ask your doctor. General instructions  Take over-the-counter and prescription medicines only as told by your doctor.  If you were given a machine to use while you sleep, use it only as told by your doctor.  If you are having surgery, make sure to tell your doctor you have sleep apnea. You may need to bring your device with you.  Keep all follow-up visits as told by your doctor. This is important. Contact a doctor if:  The machine that you were given to use during sleep bothers you or does not seem to be working.  You do not get better.  You get worse. Get help right away if:  Your chest hurts.  You have trouble breathing in enough air.  You have an uncomfortable feeling in your back, arms, or stomach.  You have trouble talking.  One side of your body feels weak.  A part of your face is hanging down. These symptoms may be an emergency. Do not wait to see if the symptoms will go away. Get medical  help right away. Call your local emergency services (911 in the [place] Do not drive yourself to the hospital. Summary  This condition affects breathing during sleep.  The most common cause is a collapsed or blocked airway.  The goal of treatment is to help you breathe normally while you sleep. This information is not intended to replace advice given to you by your health care provider. Make sure you discuss any questions you have with your health care provider. Document Revised: 10/05/2017 Document Reviewed: 08/14/2017 Elsevier Patient Education  [place].

## 2019-11-26 NOTE — Progress Notes (Signed)
PATIENT: Paul Baker DOB: 11-04-1950  REASON FOR VISIT: follow up HISTORY FROM: patient  Chief Complaint  Patient presents with  . Follow-up    cpap fu , rm 2, alone, pt c/o mask      HISTORY OF PRESENT ILLNESS: Today 11/26/19 Paul Baker is a 69 y.o. male here today for follow up for OSA on CPAP.  He has done much better with CPAP compliance but continues to struggle with his mask.  He is using a nasal pillow and reports that it slips off of his face every night.  He has tried tightening headgear with no improvement.  He reports that unusual sounds from his CPAP have improved with replacing his supplies.  He continues to work closely with primary care, cardiology and endocrinology.  Blood sugars continue to be uncontrolled.  He is having significant daytime fatigue.  Compliance report dated 10/26/2019 through 11/24/2019 reveals that he used CPAP 26 of the past 30 days for compliance of 87%.  He used CPAP greater than 4 hours 17 of the past 30 days for compliance of 57%.  Average usage on days used was 4 hours and 47 minutes.  Residual AHI was elevated at 10.5 on 10 cm of water and an EPR of 3.  There was a leak noted in the 95th percentile of 42.6 L/min.   HISTORY: (copied from previous note)  Paul Baker is a 69 y.o. male here today for follow up for OSA on CPAP, RLS and EDS. He continues to do fairly well on Requip and modafinil. He admits that he has had a difficult time with using CPAP. He has not been able to get comfortable with his mask. He also states that CPAP will cut off in the middle of the night. He called [employer] but has not taken his machine in to be checked out. He knows that he needs to use CPAP and wants to continue working on compliance.   Compliance report dated 07/26/2019 through 08/24/2019 reveals that he used CPAP 28 of the last 30 days for compliance of 67%.  He used CPAP greater than 4 hours 6 of the past 30 days for compliance of 20%.  Average  usage on days used was 3 hours and 35 minutes.  Residual AHI was 6.8 with a set pressure of 10 cm of water and an EPR of 3.  There was a leak noted in the 95th percentile of 25.4.  He was seen in the ER on 08/04/2019 for dizziness and right sided numbness. CBG was elevated (300's). Insulin was increased and he was started on Plavix for concerns of TIA. He is followed by PCP, last seen 8/18.     REVIEW OF SYSTEMS: Out of a complete 14 system review of symptoms, the patient complains only of the following symptoms, fatigue and all other reviewed systems are negative.  ESS: 19 FSS: 28  ALLERGIES: Allergies  Allergen Reactions  . Tape Rash    Pulls off skin  . Cardizem [Diltiazem Hcl]     Edema   . Cardura [Doxazosin Mesylate]     Headaches / cramps  . Lipitor [Atorvastatin] Other (See Comments)    Leg cramps  . Paxil [Paroxetine Hcl]     Unknown reaction   . Codeine Rash and Other (See Comments)    Headache   . Gabapentin Rash    HOME MEDICATIONS: Outpatient Medications Prior to Visit  Medication Sig Dispense Refill  . albuterol (VENTOLIN HFA) 108 (  90 Base) MCG/ACT inhaler Inhale 2 puffs into the lungs every 6 (six) hours as needed for wheezing or shortness of breath. 18 g 5  . alfuzosin (UROXATRAL) 10 MG 24 hr tablet Take 10 mg by mouth daily with breakfast.    . amLODipine (NORVASC) 5 MG tablet TAKE 1 TABLET BY MOUTH ONCE DAILY **DOSE  INCREASE  05/21/2018** (Patient taking differently: Take 5 mg by mouth daily. ) 90 tablet 3  . amoxicillin-clavulanate (AUGMENTIN) 875-125 MG tablet Take 1 tablet by mouth 2 (two) times daily. 20 tablet 0  . B-D ULTRAFINE III SHORT PEN 31G X 8 MM MISC USING FIVE TIMES DAILY. 150 each 5  . clopidogrel (PLAVIX) 75 MG tablet Take 1 tablet (75 mg total) by mouth daily. 90 tablet 1  . Continuous Blood Gluc Receiver (DEXCOM G6 RECEIVER) DEVI 1 Piece by Does not apply route as needed. 1 Device 0  . Continuous Blood Gluc Sensor (DEXCOM G6 SENSOR) MISC 4  Pieces by Does not apply route once a week. 4 each 2  . EUTHYROX 137 MCG tablet TAKE 1 TABLET BY MOUTH ONCE DAILY BEFORE BREAKFAST 90 tablet 0  . fluticasone furoate-vilanterol (BREO ELLIPTA) 200-25 MCG/INH AEPB Inhale 1 puff into the lungs daily. 30 each 5  . furosemide (LASIX) 40 MG tablet Take 40 mg by mouth. Take 40 mg alternating with 80 mg Daily    . glipiZIDE (GLUCOTROL XL) 5 MG 24 hr tablet Take 1 tablet (5 mg total) by mouth daily with breakfast. 90 tablet 1  . insulin regular human CONCENTRATED (HUMULIN R U-500 KWIKPEN) 500 UNIT/ML kwikpen Inject 70-80 units into the skin three times daily with meals per sliding scale. 45 mL 0  . isosorbide mononitrate (IMDUR) 60 MG 24 hr tablet TAKE 1 TABLET BY MOUTH IN THE MORNING AND 1/2 (ONE-HALF) IN THE EVENING 135 tablet 3  . loratadine (CLARITIN) 10 MG tablet Take 10 mg by mouth daily as needed for allergies.     [name] losartan (COZAAR) 50 MG tablet Take 1 tablet (50 mg total) by mouth daily. 90 tablet 0  . metFORMIN (GLUCOPHAGE) 500 MG tablet TAKE 1 TABLET BY MOUTH TWICE DAILY WITH MEALS 180 tablet 0  . metoprolol succinate (TOPROL-XL) 50 MG 24 hr tablet Take 1 tablet by mouth twice daily 180 tablet 3  . modafinil (PROVIGIL) 200 MG tablet TAKE ONE TABLET BY MOUTH ONE TIME DAILY 90 tablet 0  . nitroGLYCERIN (NITROSTAT) 0.4 MG SL tablet DISSOLVE 1 TABLET UNDER TONGUE EVERY 5 MINUTES UP TO 15 MIN FOR CHESTPAIN. IF NO RELIEF CALL 911. 25 tablet 3  . ONE TOUCH ULTRA TEST test strip TEST BLOOD SUGAR UP TO 4 TIMES DAILY. 150 each 5  . ONETOUCH DELICA LANCETS 16X MISC USE AS DIRECTED TO TEST BLOOD SUGAR 4 TIMES DAILY. 150 each 5  . pravastatin (PRAVACHOL) 40 MG tablet Take 1 tablet (40 mg total) by mouth every evening. 90 tablet 1  . rOPINIRole (REQUIP) 4 MG tablet Take 1/2 (one-half) tablet by mouth twice daily (Patient taking differently: Take 2 mg by mouth 2 (two) times daily. ) 90 tablet 1  . ROPINIROLE HCL ER PO Take 4 mg by mouth.    . sodium chloride  (OCEAN) 0.65 % SOLN nasal spray Place 1-2 sprays into both nostrils daily as needed for congestion.    . Vitamin D, Ergocalciferol, (DRISDOL) 1.25 MG (50000 UNIT) CAPS capsule Take 1 capsule by mouth once a week 12 capsule 0   No facility-administered medications  prior to visit.    PAST MEDICAL HISTORY: Past Medical History:  Diagnosis Date  . Arthritis   . Asthma   . Colon polyp   . Coronary atherosclerosis of native coronary artery    a. 2011: cath showing 90% stenosis along small non-dominant RCA (too small for PCI). b. 01/2018: cath showing nonobstructive CAD with 60 to 70% proximal to mid nondominant RCA stenosis, 50% mid LAD and 40 to 50% OM1  . DVT (deep venous thrombosis) ([place]) 2005   Right arm  . Essential hypertension, benign   . Headache   . History of transfusion   . Hypersomnia    CPAP of 16 cm, diagnosed with AHI of 60 in 2012,[place] 21- narcolepsy?  [name] Hypothyroidism   . Mixed hyperlipidemia   . Morbid obesity ([place])   . MRSA (methicillin resistant staph aureus) culture positive    08/2012  . Narcolepsy   . OSA (obstructive sleep apnea)    CPAP  . Osteoarthritis   . Pneumonia   . Psoriasis   . Pulmonary embolism ([place]) 2004  . RLS (restless legs syndrome)   . Rotator cuff disorder    Left  . Septic arthritis of knee, left ([place])   . Skin cancer, basal cell   . Type 2 diabetes mellitus ([place])     PAST SURGICAL HISTORY: Past Surgical History:  Procedure Laterality Date  . BACK SURGERY    . CATARACT EXTRACTION Bilateral   . COLONOSCOPY    . CYST REMOVAL TRUNK     from back  . EYE SURGERY Left 2016   laser to left eye  . HIP SURGERY     bone removed from both sides of hip  . KNEE ARTHROTOMY Right 12/04/2014   Procedure: KNEE ARTHROTOMY PATELLA LIGAMENT RECONSTRUSION AND REPAIR RIGHT KNEE;  Surgeon: Paralee Cancel, MD;  Location: [facility];  Service: Orthopedics;  Laterality: Right;  . KNEE SURGERY     X 25 TIMES  . LEFT HEART CATH AND CORONARY ANGIOGRAPHY N/A  01/17/2018   Procedure: LEFT HEART CATH AND CORONARY ANGIOGRAPHY;  Surgeon: Belva Crome, MD;  Location: [facility];  Service: Cardiovascular;  Laterality: N/A;  . LUMBAR DISC SURGERY     Left L3, L4, L5 discecotomy with decompression of L4 root  . TONSILLECTOMY    . TOTAL KNEE ARTHROPLASTY  2003   LEFT  . TOTAL KNEE ARTHROPLASTY Right 03/23/2014   Procedure: RIGHT TOTAL KNEE ARTHROPLASTY AND REMOVAL RIGHT TIBIAL  DEEP IMPLANT STAPLE;  Surgeon: Paralee Cancel, MD;  Location: [facility];  Service: Orthopedics;  Laterality: Right;  . TOTAL KNEE REVISION  2005   LEFT  . WRIST SURGERY      FAMILY HISTORY: Family History  Problem Relation Age of Onset  . Hypertension Father   . Heart attack Father   . Kidney Stones Father   . Seizures Grandchild   . Narcolepsy Grandchild   . Diabetes Sister     SOCIAL HISTORY: Social History   Socioeconomic History  . Marital status: Married    Spouse name: Toney Reil  . Number of children: 2  . Years of education: college  . Highest education level: Not on file  Occupational History  . Occupation: Disabled    Employer: UNEMPLOYED  Tobacco Use  . Smoking status: Former Smoker    Packs/day: 1.50    Years: 10.00    Pack years: 15.00    Types: Cigarettes    Start date: 06/19/1967    Quit  date: 01/03/1995    Years since quitting: 24.9  . Smokeless tobacco: Never Used  Vaping Use  . Vaping Use: Never used  Substance and Sexual Activity  . Alcohol use: No    Alcohol/week: 0.0 standard drinks    Comment: quit drinking in 07/86  . Drug use: No  . Sexual activity: Yes    Partners: Female  Other Topics Concern  . Not on file  Social History Narrative    69 year old, right-handed, caucasian male with a past medical history of obesity, hypertension, hyperlipidemia, diabetes, obstructive sleep apnea, presenting with frequent nighttime awakenings, excessive daytime sleepiness, also transient confusional episodes.RLS and one beosity, OSA on CPAP with  AHI of 3.2 and  setting of 16 cm water , [facility] .   Social Determinants of Health   Financial Resource Strain:   . Difficulty of Paying Living Expenses: Not on file  Food Insecurity:   . Worried About Charity fundraiser in the Last Year: Not on file  . Ran Out of Food in the Last Year: Not on file  Transportation Needs:   . Lack of Transportation (Medical): Not on file  . Lack of Transportation (Non-Medical): Not on file  Physical Activity:   . Days of Exercise per Week: Not on file  . Minutes of Exercise per Session: Not on file  Stress:   . Feeling of Stress : Not on file  Social Connections:   . Frequency of Communication with Friends and Family: Not on file  . Frequency of Social Gatherings with Friends and Family: Not on file  . Attends Religious Services: Not on file  . Active Member of Clubs or Organizations: Not on file  . Attends Archivist Meetings: Not on file  . Marital Status: Not on file  Intimate Partner Violence:   . Fear of Current or Ex-Partner: Not on file  . Emotionally Abused: Not on file  . Physically Abused: Not on file  . Sexually Abused: Not on file     PHYSICAL EXAM  Vitals:   11/26/19 0746  BP: (!) 147/72  Pulse: 72  Weight: 229 lb (103.9 kg)  Height: 5\' 5"  (1.651 m)   Body mass index is 38.11 kg/m.  Generalized: Well developed, in no acute distress  Cardiology: normal rate and rhythm, no murmur noted Respiratory: clear to auscultation bilaterally  Neurological examination  Mentation: Alert oriented to time, place, history taking. Follows all commands speech and language fluent Cranial nerve II-XII: Pupils were equal round reactive to light. Extraocular movements were full, visual field were full  Motor: The motor testing reveals 5 over 5 strength of all 4 extremities. Good symmetric motor tone is noted throughout.  Gait and station: Gait is stable without assistive devices.   DIAGNOSTIC DATA (LABS, IMAGING,  TESTING) - I reviewed patient records, labs, notes, testing and imaging myself where available.  No flowsheet data found.   Lab Results  Component Value Date   WBC 8.5 08/05/2019   HGB 12.5 (L) 08/05/2019   HCT 39.5 08/05/2019   MCV 91.9 08/05/2019   PLT 241 08/05/2019      Component Value Date/Time   NA 135 08/05/2019 0334   NA 138 09/11/2018 0949   K 4.2 08/05/2019 0334   CL 102 08/05/2019 0334   CO2 25 08/05/2019 0334   GLUCOSE 336 (H) 08/05/2019 0334   BUN 22 08/05/2019 0334   BUN 22 09/11/2018 0949   CREATININE 0.94 08/05/2019 0334   CREATININE 1.11  02/06/2019 0813   CALCIUM 9.0 08/05/2019 0334   PROT 6.9 08/05/2019 0334   PROT 6.9 09/11/2018 0949   ALBUMIN 3.8 08/05/2019 0334   ALBUMIN 4.6 09/11/2018 0949   AST 15 08/05/2019 0334   ALT 18 08/05/2019 0334   ALKPHOS 52 08/05/2019 0334   BILITOT 0.4 08/05/2019 0334   BILITOT 0.3 09/11/2018 0949   GFRNONAA >60 08/05/2019 0334   GFRNONAA 68 02/06/2019 0813   GFRAA >60 08/05/2019 0334   GFRAA 79 02/06/2019 0813   Lab Results  Component Value Date   CHOL 177 08/05/2019   HDL 56 08/05/2019   LDLCALC 102 (H) 08/05/2019   TRIG 95 08/05/2019   CHOLHDL 3.2 08/05/2019   Lab Results  Component Value Date   HGBA1C 9.6 (A) 08/01/2019   No results found for: VITAMINB12 Lab Results  Component Value Date   TSH 1.43 02/06/2019     ASSESSMENT AND PLAN 69 y.o. year old male  has a past medical history of Arthritis, Asthma, Colon polyp, Coronary atherosclerosis of native coronary artery, DVT (deep venous thrombosis) ([place]) (2005), Essential hypertension, benign, Headache, History of transfusion, Hypersomnia, Hypothyroidism, Mixed hyperlipidemia, Morbid obesity ([place]), MRSA (methicillin resistant staph aureus) culture positive, Narcolepsy, OSA (obstructive sleep apnea), Osteoarthritis, Pneumonia, Psoriasis, Pulmonary embolism ([place]) (2004), RLS (restless legs syndrome), Rotator cuff disorder, Septic arthritis of knee, left  ([place]), Skin cancer, basal cell, and Type 2 diabetes mellitus ([place]). here with     ICD-10-CM   1. OSA on CPAP  G47.33 For home use only DME continuous positive airway pressure (CPAP)   Z99.89      Paul Baker is doing better on CPAP therapy. Compliance report reveals acceptable daily compliance with suboptimal 4-hour compliance.  Compliance has improved since his last visit and he remains motivated to use CPAP.  We will order a mask refitting as I feel that this will help with usage and help get AHI to goal of less than 5 events per hour.  I will reprint download in 6 to 8 weeks to evaluate.  He will continue close follow-up with primary care and endocrinology for uncontrolled diabetes and stroke prevention. We have discussed link between fatigue/daytime sleepiness with fluctuating blood sugars. He was encouraged to continue using CPAP nightly and for greater than 4 hours each night. We will update supply orders as indicated. Risks of untreated sleep apnea review and education materials provided. Healthy lifestyle habits encouraged. He will follow up in 6 months, sooner if needed. He verbalizes understanding and agreement with this plan.     Orders Placed This Encounter  Procedures  . For home use only DME continuous positive airway pressure (CPAP)    Mask refitting please due to elevated leak, patient reports nasal pillow slips to the side of his face    Order Specific Question:   Length of Need    Answer:   Lifetime    Order Specific Question:   Patient has OSA or probable OSA    Answer:   Yes    Order Specific Question:   Is the patient currently using CPAP in the home    Answer:   Yes    Order Specific Question:   Settings    Answer:   Other see comments    Order Specific Question:   CPAP supplies needed    Answer:   Mask, headgear, cushions, filters, heated tubing and water chamber     No orders of the defined types were placed in this encounter.  I spent 15 minutes with  the patient. 50% of this time was spent counseling and educating patient on plan of care and medications.    Debbora Presto, FNP-C 11/26/2019, 8:23 AM [facility] [address] [place], [place] [address] [phone]

## 2019-12-02 ENCOUNTER — Ambulatory Visit: Payer: Medicare HMO | Admitting: Nurse Practitioner

## 2019-12-04 DIAGNOSIS — E1159 Type 2 diabetes mellitus with other circulatory complications: Secondary | ICD-10-CM | POA: Diagnosis not present

## 2019-12-05 ENCOUNTER — Other Ambulatory Visit: Payer: Self-pay

## 2019-12-05 ENCOUNTER — Encounter (HOSPITAL_COMMUNITY): Payer: Self-pay | Admitting: Emergency Medicine

## 2019-12-05 ENCOUNTER — Observation Stay (HOSPITAL_COMMUNITY)
Admission: EM | Admit: 2019-12-05 | Discharge: 2019-12-05 | Disposition: A | Discharge status: Home / Self Care | Payer: Medicare HMO | Attending: Family Medicine | Admitting: Family Medicine

## 2019-12-05 ENCOUNTER — Other Ambulatory Visit: Payer: Self-pay | Admitting: Adult Health

## 2019-12-05 ENCOUNTER — Emergency Department (HOSPITAL_COMMUNITY): Payer: Medicare HMO

## 2019-12-05 DIAGNOSIS — E039 Hypothyroidism, unspecified: Secondary | ICD-10-CM | POA: Diagnosis not present

## 2019-12-05 DIAGNOSIS — Z7951 Long term (current) use of inhaled steroids: Secondary | ICD-10-CM | POA: Diagnosis not present

## 2019-12-05 DIAGNOSIS — I110 Hypertensive heart disease with heart failure: Secondary | ICD-10-CM | POA: Diagnosis not present

## 2019-12-05 DIAGNOSIS — E1169 Type 2 diabetes mellitus with other specified complication: Secondary | ICD-10-CM | POA: Diagnosis present

## 2019-12-05 DIAGNOSIS — Z794 Long term (current) use of insulin: Secondary | ICD-10-CM | POA: Diagnosis not present

## 2019-12-05 DIAGNOSIS — Z20822 Contact with and (suspected) exposure to covid-19: Secondary | ICD-10-CM | POA: Diagnosis not present

## 2019-12-05 DIAGNOSIS — Z79899 Other long term (current) drug therapy: Secondary | ICD-10-CM | POA: Diagnosis not present

## 2019-12-05 DIAGNOSIS — E1140 Type 2 diabetes mellitus with diabetic neuropathy, unspecified: Secondary | ICD-10-CM | POA: Insufficient documentation

## 2019-12-05 DIAGNOSIS — E662 Morbid (severe) obesity with alveolar hypoventilation: Secondary | ICD-10-CM | POA: Diagnosis not present

## 2019-12-05 DIAGNOSIS — I10 Essential (primary) hypertension: Secondary | ICD-10-CM | POA: Diagnosis present

## 2019-12-05 DIAGNOSIS — Z955 Presence of coronary angioplasty implant and graft: Secondary | ICD-10-CM | POA: Diagnosis not present

## 2019-12-05 DIAGNOSIS — G2581 Restless legs syndrome: Secondary | ICD-10-CM | POA: Insufficient documentation

## 2019-12-05 DIAGNOSIS — I5189 Other ill-defined heart diseases: Secondary | ICD-10-CM

## 2019-12-05 DIAGNOSIS — I5030 Unspecified diastolic (congestive) heart failure: Secondary | ICD-10-CM | POA: Insufficient documentation

## 2019-12-05 DIAGNOSIS — E119 Type 2 diabetes mellitus without complications: Secondary | ICD-10-CM

## 2019-12-05 DIAGNOSIS — E782 Mixed hyperlipidemia: Secondary | ICD-10-CM | POA: Insufficient documentation

## 2019-12-05 DIAGNOSIS — Z85828 Personal history of other malignant neoplasm of skin: Secondary | ICD-10-CM | POA: Diagnosis not present

## 2019-12-05 DIAGNOSIS — Z87891 Personal history of nicotine dependence: Secondary | ICD-10-CM | POA: Insufficient documentation

## 2019-12-05 DIAGNOSIS — Z96651 Presence of right artificial knee joint: Secondary | ICD-10-CM | POA: Insufficient documentation

## 2019-12-05 DIAGNOSIS — I4891 Unspecified atrial fibrillation: Principal | ICD-10-CM | POA: Insufficient documentation

## 2019-12-05 DIAGNOSIS — I249 Acute ischemic heart disease, unspecified: Secondary | ICD-10-CM | POA: Diagnosis not present

## 2019-12-05 DIAGNOSIS — R0602 Shortness of breath: Secondary | ICD-10-CM | POA: Diagnosis not present

## 2019-12-05 DIAGNOSIS — I480 Paroxysmal atrial fibrillation: Secondary | ICD-10-CM | POA: Diagnosis not present

## 2019-12-05 DIAGNOSIS — J45909 Unspecified asthma, uncomplicated: Secondary | ICD-10-CM | POA: Insufficient documentation

## 2019-12-05 DIAGNOSIS — Z7984 Long term (current) use of oral hypoglycemic drugs: Secondary | ICD-10-CM | POA: Diagnosis not present

## 2019-12-05 DIAGNOSIS — E669 Obesity, unspecified: Secondary | ICD-10-CM | POA: Diagnosis present

## 2019-12-05 DIAGNOSIS — I25110 Atherosclerotic heart disease of native coronary artery with unstable angina pectoris: Secondary | ICD-10-CM | POA: Diagnosis not present

## 2019-12-05 DIAGNOSIS — Z9989 Dependence on other enabling machines and devices: Secondary | ICD-10-CM

## 2019-12-05 DIAGNOSIS — E1159 Type 2 diabetes mellitus with other circulatory complications: Secondary | ICD-10-CM

## 2019-12-05 DIAGNOSIS — R079 Chest pain, unspecified: Secondary | ICD-10-CM | POA: Diagnosis not present

## 2019-12-05 DIAGNOSIS — I200 Unstable angina: Secondary | ICD-10-CM

## 2019-12-05 DIAGNOSIS — E1165 Type 2 diabetes mellitus with hyperglycemia: Secondary | ICD-10-CM | POA: Diagnosis not present

## 2019-12-05 DIAGNOSIS — G4733 Obstructive sleep apnea (adult) (pediatric): Secondary | ICD-10-CM

## 2019-12-05 LAB — T4, FREE: Free T4: 1.36 ng/dL (ref 0.82–1.77)

## 2019-12-05 LAB — RESP PANEL BY RT-PCR (FLU A&B, COVID) ARPGX2
Influenza A by PCR: NEGATIVE
Influenza B by PCR: NEGATIVE
SARS Coronavirus 2 by RT PCR: NEGATIVE

## 2019-12-05 LAB — GLUCOSE, CAPILLARY
Glucose-Capillary: 242 mg/dL — ABNORMAL HIGH (ref 70–99)
Glucose-Capillary: 400 mg/dL — ABNORMAL HIGH (ref 70–99)
Glucose-Capillary: 360 mg/dL — ABNORMAL HIGH (ref 70–99)
Glucose-Capillary: 314 mg/dL — ABNORMAL HIGH (ref 70–99)

## 2019-12-05 LAB — MAGNESIUM: Magnesium: 2 mg/dL (ref 1.7–2.4)

## 2019-12-05 LAB — COMPREHENSIVE METABOLIC PANEL
ALT: 15 IU/L (ref 0–44)
AST: 15 IU/L (ref 0–40)
Albumin/Globulin Ratio: 1.8 (ref 1.2–2.2)
Albumin: 4.1 g/dL (ref 3.8–4.8)
Alkaline Phosphatase: 76 IU/L (ref 44–121)
BUN/Creatinine Ratio: 20 (ref 10–24)
BUN: 20 mg/dL (ref 8–27)
Bilirubin Total: 0.2 mg/dL (ref 0.0–1.2)
CO2: 21 mmol/L (ref 20–29)
Calcium: 9.7 mg/dL (ref 8.6–10.2)
Chloride: 101 mmol/L (ref 96–106)
Creatinine, Ser: 1.02 mg/dL (ref 0.76–1.27)
GFR calc Af Amer: 87 mL/min/{1.73_m2} (ref >59)
GFR calc non Af Amer: 75 mL/min/{1.73_m2} (ref >59)
Globulin, Total: 2.3 g/dL (ref 1.5–4.5)
Glucose: 193 mg/dL — ABNORMAL HIGH (ref 65–99)
Potassium: 4.4 mmol/L (ref 3.5–5.2)
Sodium: 140 mmol/L (ref 134–144)
Total Protein: 6.4 g/dL (ref 6.0–8.5)

## 2019-12-05 LAB — CBC
HCT: 40.8 % (ref 39.0–52.0)
Hemoglobin: 13.1 g/dL (ref 13.0–17.0)
MCH: 29.8 pg (ref 26.0–34.0)
MCHC: 32.1 g/dL (ref 30.0–36.0)
MCV: 92.9 fL (ref 80.0–100.0)
Platelets: 218 10*3/uL (ref 150–400)
RBC: 4.39 MIL/uL (ref 4.22–5.81)
RDW: 13.6 % (ref 11.5–15.5)
WBC: 9.3 10*3/uL (ref 4.0–10.5)
nRBC: 0 % (ref 0.0–0.2)

## 2019-12-05 LAB — BASIC METABOLIC PANEL
Anion gap: 10 (ref 5–15)
BUN: 28 mg/dL — ABNORMAL HIGH (ref 8–23)
CO2: 25 mmol/L (ref 22–32)
Calcium: 9.2 mg/dL (ref 8.9–10.3)
Chloride: 104 mmol/L (ref 98–111)
Creatinine, Ser: 1.2 mg/dL (ref 0.61–1.24)
GFR, Estimated: >60 mL/min (ref >60)
Glucose, Bld: 112 mg/dL — ABNORMAL HIGH (ref 70–99)
Potassium: 3.6 mmol/L (ref 3.5–5.1)
Sodium: 139 mmol/L (ref 135–145)

## 2019-12-05 LAB — TSH
TSH: 2.03 u[IU]/mL (ref 0.450–4.500)
TSH: 1.639 u[IU]/mL (ref 0.350–4.500)

## 2019-12-05 LAB — HEMOGLOBIN A1C
Hgb A1c MFr Bld: 8.7 % — ABNORMAL HIGH (ref 4.8–5.6)
Mean Plasma Glucose: 202.99 mg/dL

## 2019-12-05 LAB — MRSA PCR SCREENING: MRSA by PCR: NEGATIVE

## 2019-12-05 LAB — TROPONIN I (HIGH SENSITIVITY)
Troponin I (High Sensitivity): 6 ng/L (ref <18)
Troponin I (High Sensitivity): 15 ng/L (ref <18)
Troponin I (High Sensitivity): 23 ng/L — ABNORMAL HIGH (ref <18)

## 2019-12-05 LAB — PHOSPHORUS: Phosphorus: 4 mg/dL (ref 2.5–4.6)

## 2019-12-05 MED ORDER — ROPINIROLE HCL 1 MG PO TABS
2.0000 mg | ORAL_TABLET | Freq: Two times a day (BID) | ORAL | Status: DC
  Administered 2019-12-05: 2 mg via ORAL
  Filled 2019-12-05 (×7): qty 2

## 2019-12-05 MED ORDER — ACETAMINOPHEN 325 MG PO TABS: 650.0000 mg | ORAL_TABLET | Freq: Four times a day (QID) | ORAL | Status: DC | PRN

## 2019-12-05 MED ORDER — AMLODIPINE BESYLATE 5 MG PO TABS
5.0000 mg | ORAL_TABLET | Freq: Every day | ORAL | 5 refills | Status: DC
Start: 2019-12-05 — End: 2020-11-30

## 2019-12-05 MED ORDER — INSULIN REGULAR HUMAN (CONC) 500 UNIT/ML ~~LOC~~ SOPN
85.0000 [IU] | PEN_INJECTOR | Freq: Two times a day (BID) | SUBCUTANEOUS | Status: DC
  Filled 2019-12-05: qty 3

## 2019-12-05 MED ORDER — ISOSORBIDE MONONITRATE ER 30 MG PO TB24
30.0000 mg | ORAL_TABLET | Freq: Two times a day (BID) | ORAL | Status: DC
  Administered 2019-12-05 (×2): 30 mg via ORAL
  Filled 2019-12-05 (×2): qty 1

## 2019-12-05 MED ORDER — METOPROLOL SUCCINATE ER 50 MG PO TB24: 50.0000 mg | ORAL_TABLET | Freq: Two times a day (BID) | ORAL | Status: DC

## 2019-12-05 MED ORDER — ASPIRIN EC 81 MG PO TBEC: 81.0000 mg | DELAYED_RELEASE_TABLET | Freq: Every day | ORAL | 2 refills | Status: AC

## 2019-12-05 MED ORDER — LOSARTAN POTASSIUM 50 MG PO TABS
50.0000 mg | ORAL_TABLET | Freq: Every day | ORAL | Status: DC
  Administered 2019-12-05: 50 mg via ORAL
  Filled 2019-12-05: qty 1

## 2019-12-05 MED ORDER — ACETAMINOPHEN 650 MG RE SUPP: 650.0000 mg | Freq: Four times a day (QID) | RECTAL | Status: DC | PRN

## 2019-12-05 MED ORDER — INSULIN ASPART 100 UNIT/ML ~~LOC~~ SOLN
85.0000 [IU] | Freq: Two times a day (BID) | SUBCUTANEOUS | Status: DC
  Administered 2019-12-05: 85 [IU] via SUBCUTANEOUS

## 2019-12-05 MED ORDER — HEPARIN (PORCINE) 25000 UT/250ML-% IV SOLN
1300.0000 [IU]/h | INTRAVENOUS | Status: DC
  Administered 2019-12-05: 1300 [IU]/h via INTRAVENOUS
  Filled 2019-12-05: qty 250

## 2019-12-05 MED ORDER — INSULIN ASPART 100 UNIT/ML ~~LOC~~ SOLN: 75.0000 [IU] | Freq: Every day | SUBCUTANEOUS | Status: DC

## 2019-12-05 MED ORDER — NITROGLYCERIN 0.4 MG SL SUBL: 0.4000 mg | SUBLINGUAL_TABLET | SUBLINGUAL | Status: DC | PRN

## 2019-12-05 MED ORDER — METFORMIN HCL 500 MG PO TABS
500.0000 mg | ORAL_TABLET | Freq: Two times a day (BID) | ORAL | Status: DC
  Administered 2019-12-05 (×2): 500 mg via ORAL
  Filled 2019-12-05 (×2): qty 1

## 2019-12-05 MED ORDER — FLUTICASONE FUROATE-VILANTEROL 200-25 MCG/INH IN AEPB
1.0000 | INHALATION_SPRAY | Freq: Every day | RESPIRATORY_TRACT | Status: DC
  Administered 2019-12-05: 1 via RESPIRATORY_TRACT
  Filled 2019-12-05: qty 28

## 2019-12-05 MED ORDER — APIXABAN 5 MG PO TABS: 5.0000 mg | ORAL_TABLET | Freq: Two times a day (BID) | ORAL | 5 refills | Status: DC

## 2019-12-05 MED ORDER — FUROSEMIDE 40 MG PO TABS
40.0000 mg | ORAL_TABLET | ORAL | Status: DC
  Administered 2019-12-05: 40 mg via ORAL
  Filled 2019-12-05: qty 1

## 2019-12-05 MED ORDER — NITROGLYCERIN 0.4 MG SL SUBL
0.4000 mg | SUBLINGUAL_TABLET | SUBLINGUAL | Status: AC | PRN
  Administered 2019-12-05 (×3): 0.4 mg via SUBLINGUAL
  Filled 2019-12-05: qty 1

## 2019-12-05 MED ORDER — DILTIAZEM HCL 25 MG/5ML IV SOLN: 15.0000 mg | INTRAVENOUS | Status: DC | PRN

## 2019-12-05 MED ORDER — PROCHLORPERAZINE EDISYLATE 10 MG/2ML IJ SOLN: 5.0000 mg | INTRAMUSCULAR | Status: DC | PRN

## 2019-12-05 MED ORDER — MAGNESIUM SULFATE 2 GM/50ML IV SOLN
2.0000 g | Freq: Once | INTRAVENOUS | Status: AC
  Administered 2019-12-05: 2 g via INTRAVENOUS
  Filled 2019-12-05: qty 50

## 2019-12-05 MED ORDER — METOPROLOL SUCCINATE ER 25 MG PO TB24: 25.0000 mg | ORAL_TABLET | ORAL | 2 refills | Status: DC

## 2019-12-05 MED ORDER — HEPARIN BOLUS VIA INFUSION
5000.0000 [IU] | Freq: Once | INTRAVENOUS | Status: AC
  Administered 2019-12-05: 5000 [IU] via INTRAVENOUS
  Filled 2019-12-05: qty 5000

## 2019-12-05 MED ORDER — PRAVASTATIN SODIUM 40 MG PO TABS
40.0000 mg | ORAL_TABLET | Freq: Every evening | ORAL | Status: DC
  Administered 2019-12-05: 40 mg via ORAL
  Filled 2019-12-05: qty 1

## 2019-12-05 MED ORDER — POTASSIUM CHLORIDE CRYS ER 20 MEQ PO TBCR
40.0000 meq | EXTENDED_RELEASE_TABLET | Freq: Once | ORAL | Status: AC
  Administered 2019-12-05: 40 meq via ORAL
  Filled 2019-12-05: qty 2

## 2019-12-05 MED ORDER — LEVOTHYROXINE SODIUM 25 MCG PO TABS
137.0000 ug | ORAL_TABLET | Freq: Every day | ORAL | Status: DC
  Administered 2019-12-05: 137 ug via ORAL
  Filled 2019-12-05: qty 1

## 2019-12-05 MED ORDER — ACETAMINOPHEN 325 MG PO TABS: 650.0000 mg | ORAL_TABLET | Freq: Four times a day (QID) | ORAL | 1 refills | Status: AC | PRN

## 2019-12-05 MED ORDER — FUROSEMIDE 80 MG PO TABS: 80.0000 mg | ORAL_TABLET | ORAL | Status: DC

## 2019-12-05 MED ORDER — METOPROLOL SUCCINATE ER 50 MG PO TB24: 50.0000 mg | ORAL_TABLET | Freq: Two times a day (BID) | ORAL | 3 refills | Status: DC

## 2019-12-05 MED ORDER — METOPROLOL SUCCINATE ER 50 MG PO TB24
75.0000 mg | ORAL_TABLET | Freq: Two times a day (BID) | ORAL | Status: DC
  Administered 2019-12-05: 75 mg via ORAL
  Filled 2019-12-05: qty 1

## 2019-12-05 MED ORDER — POTASSIUM CHLORIDE CRYS ER 10 MEQ PO TBCR: 10.0000 meq | EXTENDED_RELEASE_TABLET | Freq: Every day | ORAL | Status: DC

## 2019-12-05 MED ORDER — ROPINIROLE HCL ER 4 MG PO TB24
4.0000 mg | ORAL_TABLET | Freq: Every evening | ORAL | Status: DC
  Administered 2019-12-05: 4 mg via ORAL
  Filled 2019-12-05 (×3): qty 1

## 2019-12-05 MED ORDER — CHLORHEXIDINE GLUCONATE CLOTH 2 % EX PADS: 6.0000 | MEDICATED_PAD | Freq: Every day | CUTANEOUS | Status: DC

## 2019-12-05 MED ORDER — MODAFINIL 100 MG PO TABS
200.0000 mg | ORAL_TABLET | Freq: Every day | ORAL | Status: DC
  Administered 2019-12-05: 200 mg via ORAL
  Filled 2019-12-05: qty 2

## 2019-12-05 MED ORDER — ASPIRIN 81 MG PO CHEW
324.0000 mg | CHEWABLE_TABLET | Freq: Once | ORAL | Status: AC
  Administered 2019-12-05: 324 mg via ORAL
  Filled 2019-12-05: qty 4

## 2019-12-05 NOTE — ED Provider Notes (Signed)
[facility] EMERGENCY DEPARTMENT Provider Note   CSN: [id number] Arrival date & time: 12/05/19  0043     History Chief Complaint  Patient presents with  . Chest Pain    Paul Baker is a 69 y.o. male.  HPI  HPI: A 69 year old patient with a history of treated diabetes, hypertension, hypercholesterolemia and obesity presents for evaluation of chest pain. Initial onset of pain was approximately 1-3 hours ago. The patient's chest pain is described as heaviness/pressure/tightness, is not worse with exertion and is relieved by nitroglycerin. The patient's chest pain is middle- or left-sided, is not well-localized, is not sharp and does radiate to the arms/jaw/neck. The patient does not complain of nausea and denies diaphoresis. The patient has no history of stroke, has no history of peripheral artery disease, has not smoked in the past 90 days and has no relevant family history of coronary artery disease (first degree relative at less than age 3).  Patient reports he woke up with crushing chest pain.  It did radiate to his arm and neck and jaw.  He reports feeling short of breath.  Since receiving nitroglycerin his feeling improved.  He has never had this pain before.  No new weakness in his arms or legs.  No ripping or tearing sensation is reported into his back.  Past Medical History:  Diagnosis Date  . Arthritis   . Asthma   . Colon polyp   . Coronary atherosclerosis of native coronary artery    a. 2011: cath showing 90% stenosis along small non-dominant RCA (too small for PCI). b. 01/2018: cath showing nonobstructive CAD with 60 to 70% proximal to mid nondominant RCA stenosis, 50% mid LAD and 40 to 50% OM1  . DVT (deep venous thrombosis) ([place]) 2005   Right arm  . Essential hypertension, benign   . Headache   . History of transfusion   . Hypersomnia    CPAP of 16 cm, diagnosed with AHI of 60 in 2012,[place] 21- narcolepsy?  [name] Hypothyroidism   . Mixed hyperlipidemia   .  Morbid obesity ([place])   . MRSA (methicillin resistant staph aureus) culture positive    08/2012  . Narcolepsy   . OSA (obstructive sleep apnea)    CPAP  . Osteoarthritis   . Pneumonia   . Psoriasis   . Pulmonary embolism ([place]) 2004  . RLS (restless legs syndrome)   . Rotator cuff disorder    Left  . Septic arthritis of knee, left ([place])   . Skin cancer, basal cell   . Type 2 diabetes mellitus [facility])     Patient Active Problem List   Diagnosis Date Noted  . Acute coronary syndrome without high troponin ([place]) 12/05/2019  . Type 2 diabetes mellitus ([place])   . Paroxysmal atrial fibrillation (H[facility])   . Hypothyroidism   . Acute bacterial rhinosinusitis 11/18/2019  . Cough 11/18/2019  . Paresthesia of skin 08/05/2019  . Asthma 08/05/2019  . Class 2 obesity 08/05/2019  . Right sided weakness 08/05/2019  . Vertigo 08/04/2019  . Dizziness 02/18/2019  . Type 2 diabetes mellitus with hypoglycemia without coma ([place]) 03/31/2017  . AKI (acute kidney injury) ([place]) 03/30/2017  . Right patellar tendon rupture 12/04/2014  . Mixed hyperlipidemia 11/25/2014  . Other specified hypothyroidism 11/25/2014  . S/P right TKA 03/23/2014  . S/P knee replacement 03/23/2014  . Spinal stenosis of cervicothoracic region 10/08/2013  . RLS (restless legs syndrome) 10/08/2013  . DM type 2 causing vascular disease ([place]) 10/08/2013  .  Preoperative cardiovascular examination 08/14/2013  . Sleep apnea with use of continuous positive airway pressure (CPAP) 04/02/2013  . 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/02/2013  . Diabetic neuropathy ([place]) 07/23/2012  . Obesity, morbid ([place]) 05/01/2012  . Hypersomnia, persistent 05/01/2012  . OSA on CPAP 04/19/2012  . Chest pain 04/19/2012  . Essential hypertension, benign 12/16/2009  . CORONARY ATHEROSCLEROSIS NATIVE CORONARY ARTERY 12/16/2009  . Hyperglycemia due to type 2 diabetes mellitus ([place]) 11/30/2009  . Accelerating angina ([place]) 11/30/2009    Past  Surgical History:  Procedure Laterality Date  . BACK SURGERY    . CATARACT EXTRACTION Bilateral   . COLONOSCOPY    . CYST REMOVAL TRUNK     from back  . EYE SURGERY Left 2016   laser to left eye  . HIP SURGERY     bone removed from both sides of hip  . KNEE ARTHROTOMY Right 12/04/2014   Procedure: KNEE ARTHROTOMY PATELLA LIGAMENT RECONSTRUSION AND REPAIR RIGHT KNEE;  Surgeon: Paralee Cancel, MD;  Location: [facility];  Service: Orthopedics;  Laterality: Right;  . KNEE SURGERY     X 25 TIMES  . LEFT HEART CATH AND CORONARY ANGIOGRAPHY N/A 01/17/2018   Procedure: LEFT HEART CATH AND CORONARY ANGIOGRAPHY;  Surgeon: Belva Crome, MD;  Location: [facility];  Service: Cardiovascular;  Laterality: N/A;  . LUMBAR DISC SURGERY     Left L3, L4, L5 discecotomy with decompression of L4 root  . TONSILLECTOMY    . TOTAL KNEE ARTHROPLASTY  2003   LEFT  . TOTAL KNEE ARTHROPLASTY Right 03/23/2014   Procedure: RIGHT TOTAL KNEE ARTHROPLASTY AND REMOVAL RIGHT TIBIAL  DEEP IMPLANT STAPLE;  Surgeon: Paralee Cancel, MD;  Location: [facility];  Service: Orthopedics;  Laterality: Right;  . TOTAL KNEE REVISION  2005   LEFT  . WRIST SURGERY         Family History  Problem Relation Age of Onset  . Hypertension Father   . Heart attack Father   . Kidney Stones Father   . Seizures Grandchild   . Narcolepsy Grandchild   . Diabetes Sister     Social History   Tobacco Use  . Smoking status: Former Smoker    Packs/day: 1.50    Years: 10.00    Pack years: 15.00    Types: Cigarettes    Start date: 06/19/1967    Quit date: 01/03/1995    Years since quitting: 24.9  . Smokeless tobacco: Never Used  Vaping Use  . Vaping Use: Never used  Substance Use Topics  . Alcohol use: No    Alcohol/week: 0.0 standard drinks    Comment: quit drinking in 07/86  . Drug use: No    Home Medications Prior to Admission medications   Medication Sig Start Date End Date Taking? Authorizing Provider  albuterol (VENTOLIN  HFA) 108 (90 Base) MCG/ACT inhaler Inhale 2 puffs into the lungs every 6 (six) hours as needed for wheezing or shortness of breath. 12/12/18   [name], MD  alfuzosin (UROXATRAL) 10 MG 24 hr tablet Take 10 mg by mouth daily with breakfast.    [provider]  amLODipine (NORVASC) 5 MG tablet TAKE 1 TABLET BY MOUTH ONCE DAILY **DOSE  INCREASE  05/21/2018** Patient taking differently: Take 5 mg by mouth daily.  06/30/19   Satira Sark, MD  amoxicillin-clavulanate (AUGMENTIN) 875-125 MG tablet Take 1 tablet by mouth 2 (two) times daily. 11/18/19   Chalmers Guest, NP  B-D  ULTRAFINE III SHORT PEN 31G X 8 MM MISC USING FIVE TIMES DAILY. 11/06/16   Cassandria Anger, MD  clopidogrel (PLAVIX) 75 MG tablet Take 1 tablet (75 mg total) by mouth daily. 08/20/19   Elvia Collum M, DO  Continuous Blood Gluc Receiver (DEXCOM G6 RECEIVER) DEVI 1 Piece by Does not apply route as needed. 04/13/18   Cassandria Anger, MD  Continuous Blood Gluc Sensor (DEXCOM G6 SENSOR) MISC 4 Pieces by Does not apply route once a week. 05/15/18   Cassandria Anger, MD  EUTHYROX 137 MCG tablet TAKE 1 TABLET BY MOUTH ONCE DAILY BEFORE BREAKFAST 11/11/19   Cassandria Anger, MD  fluticasone furoate-vilanterol (BREO ELLIPTA) 200-25 MCG/INH AEPB Inhale 1 puff into the lungs daily. 01/13/19   Chesley Mires, MD  furosemide (LASIX) 40 MG tablet Take 40 mg by mouth. Take 40 mg alternating with 80 mg Daily    [provider]  glipiZIDE (GLUCOTROL XL) 5 MG 24 hr tablet Take 1 tablet (5 mg total) by mouth daily with breakfast. 08/01/19   Nida, Marella Chimes, MD  insulin regular human CONCENTRATED (HUMULIN R U-500 KWIKPEN) 500 UNIT/ML kwikpen Inject 70-80 units into the skin three times daily with meals per sliding scale. 10/20/19   Cassandria Anger, MD  isosorbide mononitrate (IMDUR) 60 MG 24 hr tablet TAKE 1 TABLET BY MOUTH IN THE MORNING AND 1/2 (ONE-HALF) IN THE EVENING 06/30/19   Satira Sark,  MD  loratadine (CLARITIN) 10 MG tablet Take 10 mg by mouth daily as needed for allergies.     [provider]  losartan (COZAAR) 50 MG tablet Take 1 tablet (50 mg total) by mouth daily. 07/21/19   Lovena Le, Malena M, DO  metFORMIN (GLUCOPHAGE) 500 MG tablet TAKE 1 TABLET BY MOUTH TWICE DAILY WITH MEALS 10/10/19   Nida, Marella Chimes, MD  metoprolol succinate (TOPROL-XL) 50 MG 24 hr tablet Take 1 tablet by mouth twice daily 08/11/19   Satira Sark, MD  modafinil (PROVIGIL) 200 MG tablet TAKE ONE TABLET BY MOUTH ONE TIME DAILY 10/13/19   Lomax, Amy, NP  nitroGLYCERIN (NITROSTAT) 0.4 MG SL tablet DISSOLVE 1 TABLET UNDER TONGUE EVERY 5 MINUTES UP TO 15 MIN FOR CHESTPAIN. IF NO RELIEF CALL 911. 11/08/17   Satira Sark, MD  ONE TOUCH ULTRA TEST test strip TEST BLOOD SUGAR UP TO 4 TIMES DAILY. 10/10/16   Cassandria Anger, MD  ONETOUCH DELICA LANCETS 03J MISC USE AS DIRECTED TO TEST BLOOD SUGAR 4 TIMES DAILY. 10/10/16   Cassandria Anger, MD  pravastatin (PRAVACHOL) 40 MG tablet Take 1 tablet (40 mg total) by mouth every evening. 10/20/19   Lovena Le, Malena M, DO  rOPINIRole (REQUIP) 4 MG tablet Take 1/2 (one-half) tablet by mouth twice daily Patient taking differently: Take 2 mg by mouth 2 (two) times daily.  08/04/19   Lomax, Amy, NP  ROPINIROLE HCL ER PO Take 4 mg by mouth.    [provider]  sodium chloride (OCEAN) 0.65 % SOLN nasal spray Place 1-2 sprays into both nostrils daily as needed for congestion.    [provider]  Vitamin D, Ergocalciferol, (DRISDOL) 1.25 MG (50000 UNIT) CAPS capsule Take 1 capsule by mouth once a week 11/11/19   Cassandria Anger, MD    Allergies    Tape, Cardizem [diltiazem hcl], Cardura [doxazosin mesylate], Lipitor [atorvastatin], Paxil [paroxetine hcl], Codeine, and Gabapentin  Review of Systems   Review of Systems  Constitutional: Negative for fever.  Respiratory: Positive for shortness of breath.   Cardiovascular:  Positive for chest pain.  Gastrointestinal: Negative for vomiting.  Neurological: Negative for weakness and numbness.  All other systems reviewed and are negative.   Physical Exam Updated Vital Signs BP 105/80   Pulse 70   Temp 98.4 F (36.9 C) (Oral)   Resp 17   Ht 1.651 m (5\' 5" )   Wt 103 kg   SpO2 95%   BMI 37.79 kg/m   Physical Exam CONSTITUTIONAL: Well developed/well nourished HEAD: Normocephalic/atraumatic EYES: EOMI/PERRL ENMT: Mucous membranes moist NECK: supple no meningeal signs SPINE/BACK:entire spine nontender CV: S1/S2 noted, no murmurs/rubs/gallops noted LUNGS: Lungs are clear to auscultation bilaterally, no apparent distress ABDOMEN: soft, nontender, no rebound or guarding, bowel sounds noted throughout abdomen GU:no cva tenderness NEURO: Pt is awake/alert/appropriate, moves all extremitiesx4.  No facial droop.   EXTREMITIES: pulses normal/equalx4, full ROM, no calf tenderness or edema SKIN: warm, color normal PSYCH: no abnormalities of mood noted, alert and oriented to situation  ED Results / Procedures / Treatments   Labs (all labs ordered are listed, but only abnormal results are displayed) Labs Reviewed  BASIC METABOLIC PANEL - Abnormal; Notable for the following components:      Result Value   Glucose, Bld 112 (*)    BUN 28 (*)    All other components within normal limits  RESP PANEL BY RT-PCR (FLU A&B, COVID) ARPGX2  CBC  MAGNESIUM  PHOSPHORUS  TROPONIN I (HIGH SENSITIVITY)  TROPONIN I (HIGH SENSITIVITY)    EKG    EKG Interpretation  Date/Time:  Friday December 05 2019 02:46:52 EST Ventricular Rate:  85 PR Interval:    QRS Duration: 99 QT Interval:  366 QTC Calculation: 436 R Axis:   -36 Text Interpretation: Sinus rhythm Left axis deviation changed from prior, now in sinus rhythm Confirmed by Ripley Fraise [phone]) on 12/05/2019 4:04:21 AM       Radiology DG Chest 2 View  Result Date: 12/05/2019 CLINICAL DATA:  Chest pain  and shortness of breath EXAM: CHEST - 2 VIEW COMPARISON:  02/18/2019 FINDINGS: The heart size and mediastinal contours are within normal limits. Both lungs are clear. The visualized skeletal structures are unremarkable. IMPRESSION: No active cardiopulmonary disease. Electronically Signed   By: Inez Catalina M.D.   On: 12/05/2019 01:54    Procedures .Critical Care Performed by: Ripley Fraise, MD Authorized by: Ripley Fraise, MD   Critical care provider statement:    Critical care time (minutes):  40   Critical care start time:  12/05/2019 3:30 AM   Critical care end time:  12/05/2019 4:10 AM   Critical care was necessary to treat or prevent imminent or life-threatening deterioration of the following conditions:  Cardiac failure   Critical care was time spent personally by me on the following activities:  Development of treatment plan with patient or surrogate, discussions with consultants, evaluation of patient's response to treatment, examination of patient, ordering and review of radiographic studies, pulse oximetry, re-evaluation of patient's condition, review of old charts, ordering and review of laboratory studies and ordering and performing treatments and interventions     Medications Ordered in ED Medications  magnesium sulfate IVPB 2 g 50 mL (has no administration in time range)  potassium chloride SA (KLOR-CON) CR tablet 40 mEq (has no administration in time range)  nitroGLYCERIN (NITROSTAT) SL tablet 0.4 mg (0.4 mg Sublingual Given 12/05/19 0307)  aspirin chewable tablet 324 mg (324 mg Oral Given 12/05/19 0234)  ED Course  I have reviewed the triage vital signs and the nursing notes.  Pertinent labs & imaging results that were available during my care of the patient were reviewed by me and considered in my medical decision making (see chart for details).    MDM Rules/Calculators/A&P HEAR Score: 6                         This patient presents to the ED for concern of  chest pain, this involves an extensive number of treatment options, and is a complaint that carries with it a high risk of complications and morbidity.  The differential diagnosis includes pulmonary embolism, acute coronary syndrome, aortic dissection, pericarditis, pneumonia   Lab Tests:   I Ordered, reviewed, and interpreted labs, which included troponin, CBC, BMP  Medicines ordered:   I ordered medication nitroglycerin/aspirin for chest pain  Imaging Studies ordered:   I ordered imaging studies which included chest x-ray   I independently visualized and interpreted imaging which showed no acute finding  Additional history obtained:   Additional history obtained from wife  Previous records obtained and reviewed   Consultations Obtained:   I consulted [facility] hospitalist and discussed lab and imaging findings  Reevaluation:  After the interventions stated above, I reevaluated the patient and found patient is improved  Critical Interventions:  . Aspirin and nitroglycerin   Patient with known history of CAD presents with crushing chest pain.  Initial EKG appeared to show atrial fibrillation that has spontaneously converted.  Patient denies known history of afib.   He is now back in sinus rhythm.  His chest pain is improved with nitroglycerin and aspirin.  His troponin has elevated but not above the threshold.  His EKG is nonischemic.  However patient is high risk for worsening at this time will need to be admitted to the hospital.  At this point I have low suspicion for PE or dissection at this time  Discussed with Dr. Olevia Bowens for admission    This patients CHA2DS2-VASc Score and unadjusted Ischemic Stroke Rate (% per year) is equal to 4.8 % stroke rate/year from a score of 4  Above score calculated as 1 point each if present [CHF, HTN, DM, Vascular=MI/PAD/Aortic Plaque, Age if 65-74, or Male] Above score calculated as 2 points each if present [Age > 75, or  Stroke/TIA/TE]    Final Clinical Impression(s) / ED Diagnoses Final diagnoses:  Paroxysmal atrial fibrillation ([place])  Unstable angina [facility])    Rx / DC Orders ED Discharge Orders    None       Ripley Fraise, MD 12/05/19 [phone]

## 2019-12-05 NOTE — Care Management Obs Status (Signed)
[place] NOTIFICATION   Patient Details  Name: JAWANZA ZAMBITO MRN: [id number] Date of Birth: 1950-01-19   Medicare Observation Status Notification Given:  Yes    Iona Beard, [place] 12/05/2019, 4:16 PM

## 2019-12-05 NOTE — ED Triage Notes (Signed)
Pt arrives c/o CP, left arm pain, and some shortness of breath. Pt states he had 2 heart blockages that they didn't fix last year.

## 2019-12-05 NOTE — Progress Notes (Addendum)
Inpatient Diabetes Program Recommendations  AACE/ADA: New Consensus Statement on Inpatient Glycemic Control (2015)  Target Ranges:  Prepandial:   less than 140 mg/dL      Peak postprandial:   less than 180 mg/dL (1-2 hours)      Critically ill patients:  140 - 180 mg/dL   Lab Results  Component Value Date   GLUCAP 360 (H) 12/05/2019   HGBA1C 8.7 (H) 12/05/2019    Review of Glycemic Control  Diabetes history: type 2 Outpatient Diabetes medications: Humulin R concentrated U-500 insulin 70-80 units at breakfast, lunch, and dinner; Metformin 500 mg BID Current orders for Inpatient glycemic control: Novolog 85 units BID, novolog 75 units at supper  Inpatient Diabetes Program Recommendations:   Noted that order has been written for Novolog insulin, not Humulin R concentrated U-500 insulin. Spoke with staff RN about checking CBGs every 2 hours after receiving Novolog 85 units for CBG of 400 mg/dl due to concern for hypoglycemia following large dose of Novolog. Spoke with Dr. Denton Brick in regards to patient being on U-500 insulin at home and that patient had received Novolog 85 units at 1130 today for a CBG of 400 mg/dl. Dr. Denton Brick will speak with pharmacist in regards to changes.   Recommend starting Humulin R concentrated U-500 insulin 20 units TID and increase dosages as needed while in the hospital.   Harvel Ricks RN BSN CDE Diabetes Coordinator Pager: [phone]  8am-5pm

## 2019-12-05 NOTE — H&P (Addendum)
History and Physical    ANEUDY CHAMPLAIN NLG:[id number] DOB: 12/12/1950 DOA: 12/05/2019  PCP: Erven Colla, DO   Patient coming from: Home.  I have personally briefly reviewed patient's old medical records in [place]  Chief Complaint: Chest pain.  HPI: Paul Baker is a 69 y.o. male with medical history significant of osteoarthritis, asthma/COPD, colon polyps, coronary artery disease, hypertension, history of DVT and pulmonary embolism in 2004, headache, history of blood transfusion, hypersomnia, OSA on CPAP, restless leg syndrome, type 2 diabetes, mixed hyperlipidemia, morbid obesity, hypothyroidism, history of pneumonia, psoriasis, history of left knee septic arthritis, basal cell skin cancer who is coming to the emergency department with complaints of pressure-like chest pain radiated to his left shoulder, cervical and mandibular area associated with dyspnea, lightheadedness that woke him up from sleep around 2330 yesterday.  The pain was relieved by SL NTG x3 and was not worsened by exertion, although the patient did not exert significantly during the episode.  He mentions that he has had chest pain episodes before, but nothing with this much intensity. He is not sure about having palpitations, denies nausea, emesis, diaphoresis, PND or orthopnea.   He frequently gets lower extremity edema.  He denies fever, chills, rhinorrhea, sore throat or hemoptysis.  He occasionally gets dyspneic with wheezing.  He denies abdominal pain, diarrhea, constipation, melena or hematochezia.  No dysuria, frequency or hematuria.  He occasionally gets blurred vision, he has been urinating a lot, but he some furosemide and states he drinks a good amount of water daily.  No polyphagia.  His insulin was recently increased from 70 -80 twice a day to 78-85 twice a day.  However, he mentions that he has been getting episodes of hypoglycemia since the insulin dosage was increased within the last 2  weeks.  ED Course: Initial vital signs were temperature 98.4 F, pulse 122, respirations 21, blood pressure 155/106 mmHg and O2 sat 95% on room air.  The patient received 3 sublingual nitroglycerin tablets and 324 mg of chewable aspirin in the emergency department.  I started heparin infusion after explaining to the patient risks and benefits, ordered a one-time dose of K-Lor 40 mEq p.o. and magnesium sulfate 2 g IVPB.  Labwork: CBC was normal.  Troponin #1 was 6 troponin #2 was 15 ng/L.  BNP showed normal electrolytes and creatinine.  Glucose 112 and BUN 28 mg/dL.EKG #1 shows atrial fibrillation with RVR at 129 bpm.  EKG #2 sinus rhythm with left axis deviation.  Imaging: A 2 view chest radiograph did not show any active cardiopulmonary disease.  Please see images and full regular report for further detail.  Review of Systems: As per HPI otherwise all other systems reviewed and are negative.  Past Medical History:  Diagnosis Date  . Arthritis   . Asthma   . Colon polyp   . Coronary atherosclerosis of native coronary artery    a. 2011: cath showing 90% stenosis along small non-dominant RCA (too small for PCI). b. 01/2018: cath showing nonobstructive CAD with 60 to 70% proximal to mid nondominant RCA stenosis, 50% mid LAD and 40 to 50% OM1  . DVT (deep venous thrombosis) ([place]) 2005   Right arm  . Essential hypertension, benign   . Headache   . History of transfusion   . Hypersomnia    CPAP of 16 cm, diagnosed with AHI of 60 in 2012,epworth 21- narcolepsy?  [name] Hypothyroidism   . Mixed hyperlipidemia   . Morbid obesity ([place])   .  MRSA (methicillin resistant staph aureus) culture positive    08/2012  . Narcolepsy   . OSA (obstructive sleep apnea)    CPAP  . Osteoarthritis   . Pneumonia   . Psoriasis   . Pulmonary embolism ([place]) 2004  . RLS (restless legs syndrome)   . Rotator cuff disorder    Left  . Septic arthritis of knee, left ([place])   . Skin cancer, basal cell   . Type 2  diabetes mellitus ([place])    Past Surgical History:  Procedure Laterality Date  . BACK SURGERY    . CATARACT EXTRACTION Bilateral   . COLONOSCOPY    . CYST REMOVAL TRUNK     from back  . EYE SURGERY Left 2016   laser to left eye  . HIP SURGERY     bone removed from both sides of hip  . KNEE ARTHROTOMY Right 12/04/2014   Procedure: KNEE ARTHROTOMY PATELLA LIGAMENT RECONSTRUSION AND REPAIR RIGHT KNEE;  Surgeon: Paralee Cancel, MD;  Location: [facility];  Service: Orthopedics;  Laterality: Right;  . KNEE SURGERY     X 25 TIMES  . LEFT HEART CATH AND CORONARY ANGIOGRAPHY N/A 01/17/2018   Procedure: LEFT HEART CATH AND CORONARY ANGIOGRAPHY;  Surgeon: Belva Crome, MD;  Location: [facility];  Service: Cardiovascular;  Laterality: N/A;  . LUMBAR DISC SURGERY     Left L3, L4, L5 discecotomy with decompression of L4 root  . TONSILLECTOMY    . TOTAL KNEE ARTHROPLASTY  2003   LEFT  . TOTAL KNEE ARTHROPLASTY Right 03/23/2014   Procedure: RIGHT TOTAL KNEE ARTHROPLASTY AND REMOVAL RIGHT TIBIAL  DEEP IMPLANT STAPLE;  Surgeon: Paralee Cancel, MD;  Location: [facility];  Service: Orthopedics;  Laterality: Right;  . TOTAL KNEE REVISION  2005   LEFT  . WRIST SURGERY     Social History  reports that he quit smoking about 24 years ago. His smoking use included cigarettes. He started smoking about 52 years ago. He has a 15.00 pack-year smoking history. He has never used smokeless tobacco. He reports that he does not drink alcohol and does not use drugs.  Allergies  Allergen Reactions  . Tape Rash    Pulls off skin  . Cardizem [Diltiazem Hcl]     Edema   . Cardura [Doxazosin Mesylate]     Headaches / cramps  . Lipitor [Atorvastatin] Other (See Comments)    Leg cramps  . Paxil [Paroxetine Hcl]     Unknown reaction   . Codeine Rash and Other (See Comments)    Headache   . Gabapentin Rash   Family History  Problem Relation Age of Onset  . Hypertension Father   . Heart attack Father   . Kidney  Stones Father   . Seizures Grandchild   . Narcolepsy Grandchild   . Diabetes Sister    Prior to Admission medications   Medication Sig Start Date End Date Taking? Authorizing Provider  amLODipine (NORVASC) 5 MG tablet TAKE 1 TABLET BY MOUTH ONCE DAILY **DOSE  INCREASE  05/21/2018** Patient taking differently: Take 5 mg by mouth daily.  06/30/19  Yes Satira Sark, MD  amoxicillin-clavulanate (AUGMENTIN) 875-125 MG tablet Take 1 tablet by mouth 2 (two) times daily. 11/18/19  Yes Chalmers Guest, NP  clopidogrel (PLAVIX) 75 MG tablet Take 1 tablet (75 mg total) by mouth daily. 08/20/19  Yes Taylor, Malena M, DO  EUTHYROX 137 MCG tablet TAKE 1 TABLET BY MOUTH ONCE DAILY BEFORE BREAKFAST  11/11/19  Yes Nida, Marella Chimes, MD  fluticasone furoate-vilanterol (BREO ELLIPTA) 200-25 MCG/INH AEPB Inhale 1 puff into the lungs daily. 01/13/19  Yes Chesley Mires, MD  furosemide (LASIX) 40 MG tablet Take 40 mg by mouth. Take 40 mg alternating with 80 mg Daily   Yes [provider]  insulin regular human CONCENTRATED (HUMULIN R U-500 KWIKPEN) 500 UNIT/ML kwikpen Inject 70-80 units into the skin three times daily with meals per sliding scale. Patient taking differently: 78-85 Units. Inject 70-80 units into the skin three times daily with meals per sliding scale. 10/20/19  Yes Nida, Marella Chimes, MD  isosorbide mononitrate (IMDUR) 60 MG 24 hr tablet TAKE 1 TABLET BY MOUTH IN THE MORNING AND 1/2 (ONE-HALF) IN THE EVENING 06/30/19  Yes Satira Sark, MD  losartan (COZAAR) 50 MG tablet Take 1 tablet (50 mg total) by mouth daily. 07/21/19  Yes Lovena Le, Malena M, DO  metFORMIN (GLUCOPHAGE) 500 MG tablet TAKE 1 TABLET BY MOUTH TWICE DAILY WITH MEALS 10/10/19  Yes Nida, Marella Chimes, MD  metoprolol succinate (TOPROL-XL) 50 MG 24 hr tablet Take 1 tablet by mouth twice daily 08/11/19  Yes Satira Sark, MD  modafinil (PROVIGIL) 200 MG tablet TAKE ONE TABLET BY MOUTH ONE TIME DAILY 10/13/19  Yes Lomax,  Amy, NP  nitroGLYCERIN (NITROSTAT) 0.4 MG SL tablet DISSOLVE 1 TABLET UNDER TONGUE EVERY 5 MINUTES UP TO 15 MIN FOR CHESTPAIN. IF NO RELIEF CALL 911. 11/08/17  Yes Satira Sark, MD  pravastatin (PRAVACHOL) 40 MG tablet Take 1 tablet (40 mg total) by mouth every evening. 10/20/19  Yes Taylor, Malena M, DO  rOPINIRole (REQUIP) 4 MG tablet Take 1/2 (one-half) tablet by mouth twice daily Patient taking differently: Take 2 mg by mouth 2 (two) times daily.  08/04/19  Yes Lomax, Amy, NP  ROPINIROLE HCL ER PO Take 4 mg by mouth every evening.    Yes [provider]  Vitamin D, Ergocalciferol, (DRISDOL) 1.25 MG (50000 UNIT) CAPS capsule Take 1 capsule by mouth once a week 11/11/19  Yes Nida, Marella Chimes, MD  albuterol (VENTOLIN HFA) 108 (90 Base) MCG/ACT inhaler Inhale 2 puffs into the lungs every 6 (six) hours as needed for wheezing or shortness of breath. 12/12/18   [name], MD  alfuzosin (UROXATRAL) 10 MG 24 hr tablet Take 10 mg by mouth daily with breakfast.    [provider]  B-D ULTRAFINE III SHORT PEN 31G X 8 MM MISC USING FIVE TIMES DAILY. 11/06/16   Cassandria Anger, MD  Continuous Blood Gluc Receiver (DEXCOM G6 RECEIVER) DEVI 1 Piece by Does not apply route as needed. 04/13/18   Cassandria Anger, MD  Continuous Blood Gluc Sensor (DEXCOM G6 SENSOR) MISC 4 Pieces by Does not apply route once a week. 05/15/18   Cassandria Anger, MD  loratadine (CLARITIN) 10 MG tablet Take 10 mg by mouth daily as needed for allergies.     [provider]  ONE TOUCH ULTRA TEST test strip TEST BLOOD SUGAR UP TO 4 TIMES DAILY. 10/10/16   Cassandria Anger, MD  ONETOUCH DELICA LANCETS 14E MISC USE AS DIRECTED TO TEST BLOOD SUGAR 4 TIMES DAILY. 10/10/16   Cassandria Anger, MD   Physical Exam: Vitals:   12/05/19 0300 12/05/19 0330 12/05/19 0430 12/05/19 0500  BP: (!) 136/100 105/80 117/68 132/63  Pulse: 83 70 62 65  Resp: 19 17 17 18   Temp:      TempSrc:  SpO2: 96% 95% 97% 96%  Weight:      Height:       Constitutional: NAD, calm, comfortable Eyes: PERRL, lids and conjunctivae normal ENMT: Mucous membranes are moist. Posterior pharynx clear of any exudate or lesions. Neck: normal, supple, no masses, no thyromegaly Respiratory: clear to auscultation bilaterally, no wheezing, no crackles. Normal respiratory effort. No accessory muscle use.  Cardiovascular: Regular rate and rhythm, no murmurs / rubs / gallops. No extremity edema. 2+ pedal pulses. No carotid bruits.  Abdomen: Obese, nondistended.  Bowel sounds positive.  Soft, tenderness, no masses palpated. No hepatosplenomegaly. Musculoskeletal: no clubbing / cyanosis. Good ROM, no contractures. Normal muscle tone.  Skin: no rashes, lesions, ulcers on very limited dermatological examination. Neurologic: CN 2-12 grossly intact. Sensation intact, DTR normal. Strength 5/5 in all 4.  Psychiatric: Normal judgment and insight. Alert and oriented x 3. Normal mood.   Labs on Admission: I have personally reviewed following labs and imaging studies  CBC: Recent Labs  Lab 12/05/19 0115  WBC 9.3  HGB 13.1  HCT 40.8  MCV 92.9  PLT 025   Basic Metabolic Panel: Recent Labs  Lab 12/05/19 0115 12/05/19 0306  NA 139  --   K 3.6  --   CL 104  --   CO2 25  --   GLUCOSE 112*  --   BUN 28*  --   CREATININE 1.20  --   CALCIUM 9.2  --   MG  --  2.0  PHOS  --  4.0   GFR: Estimated Creatinine Clearance: 65.1 mL/min (by C-G formula based on SCr of 1.2 mg/dL).  Liver Function Tests: No results for input(s): AST, ALT, ALKPHOS, BILITOT, PROT, ALBUMIN in the last 168 hours.  Radiological Exams on Admission: DG Chest 2 View  Result Date: 12/05/2019 CLINICAL DATA:  Chest pain and shortness of breath EXAM: CHEST - 2 VIEW COMPARISON:  02/18/2019 FINDINGS: The heart size and mediastinal contours are within normal limits. Both lungs are clear. The visualized skeletal structures are unremarkable.  IMPRESSION: No active cardiopulmonary disease. Electronically Signed   By: Inez Catalina M.D.   On: 12/05/2019 01:54   02/19/2019 echocardiogram IMPRESSIONS:  1. Left ventricular ejection fraction, by estimation, is 55 to 60%. The  left ventricle has normal function. The left ventricle has no regional  wall motion abnormalities. Left ventricular diastolic parameters are  consistent with Grade I diastolic  dysfunction (impaired relaxation). Elevated left ventricular end-diastolic  pressure.  2. Right ventricular systolic function is normal. The right ventricular  size is normal.  3. Left atrial size was severely dilated.  4. The mitral valve is grossly normal. Trivial mitral valve  regurgitation.  5. The aortic valve was not well visualized. Aortic valve regurgitation  is not visualized.  6. The inferior vena cava is normal in size with greater than 50%  respiratory variability, suggesting right atrial pressure of 3 mmHg.   08/05/2019 echocardiogram IMPRESSIONS:  1. Left ventricular ejection fraction, by estimation, is 55 to 60%. The  left ventricle has normal function. The left ventricle has no regional  wall motion abnormalities. Left ventricular diastolic parameters were  normal.  2. Right ventricular systolic function is normal. The right ventricular  size is normal. Tricuspid regurgitation signal is inadequate for assessing  PA pressure.  3. Left atrial size was upper normal.  4. The mitral valve is grossly normal. Mild mitral valve regurgitation.  5. The aortic valve is tricuspid. Aortic valve regurgitation is not  visualized.  6. The inferior vena cava is normal in size with greater than 50%  respiratory variability, suggesting right atrial pressure of 3 mmHg.   Cardiac catheterization 01/17/2018:  Nonobstructive coronary artery disease with 60 to 70% proximal to mid nondominant right coronary, 50% mid LAD, and 40 to 50% first obtuse marginal  narrowing.  Normal left ventricular systolic function with EF 60%.  LVEDP is 17 mmHg.  Failed right heart catheterization from right antecubital vein due to distal subclavian stenosis likely from prior PICC line. Further attempts a right heart cath were aborted as IV heparin had been given for left heart catheterization.  RECOMMENDATIONS:   INOCA (ischemia with no obstructive coronary artery disease). Aggressive risk factor modification including excellent lipid control.  2D Doppler echocardiogram to assess RV and estimate PA pressure. Right heart cath from femoral approach her left arm could be attempted in the future if that information is absolutely needed.  He is not compliant with CPAP which may be the driver for his recent decrease in exertional tolerance and chest pain.  EKG: Independently reviewed.  EKG #1 Vent. rate 121 BPM PR interval * ms QRS duration 92 ms QT/QTc 340/465 ms P-R-T axes * -13 31 Atrial fibrillation Ventricular premature complex Inferior infarct, age indeterminate Baseline wander in lead(s) III  EKG #2 Vent. rate 85 BPM PR interval * ms QRS duration 99 ms QT/QTc 366/436 ms P-R-T axes 48 -36 31 Sinus rhythm Left axis deviation  Assessment/Plan Principal Problem:   Acute coronary syndrome without high troponin (HCC) In the setting of atrial fibrillation with RVR. Chest pressure disappeared after conversion to sinus rhythm. Observation/stepdown for closer monitoring. Hold clopidogrel since he received aspirin/on heparin. Continue beta-blocker and statin. Continue long-acting nitrates. Continue sublingual nitroglycerin as needed. Consult cardiology this morning.  Active Problems:   Paroxysmal atrial fibrillation (HCC) CHA?DS?-VASc Score of at least 5. (Age, DM 2, HTN, CAD and diastolic dysfunction) Hold dihydropyridine CCB. Continue metoprolol 50 mg p.o. twice daily. Optimize electrolytes. Change albuterol to Xopenex. Patient asked  to avoid caffeine intake. Consult cardiology. Had recent echo 4 months ago. Defer need for new echo to cardiology. Diltiazem IV pushes as needed for tachycardia/RVR.    Grade I diastolic dysfunction Normal values on last echo. However, he has frequent LE pitting edema. Continue furosemide 40 mg p.o. daily. Add low-dose potassium supplementation. Continue Imdur 60 mg in a.m. and 30 mg in afternoon    Essential hypertension, benign Hold amlodipine. Continue metoprolol ER 50 mg p.o. twice daily. Continue furosemide 40 to 80 mg p.o. daily. Add potassium supplementation with diuretic. Continue losartan 50 mg p.o. daily. Monitor BP, HR, renal function electrolytes.    Mixed hyperlipidemia Continue pravastatin 40 mg p.o. daily.    Class 2 obesity Lifestyle modifications. Follow-up with PCP. Follow-up with endocrinology.    Type 2 diabetes mellitus (HCC) Positive neuropathic pain. No retinopathy or nephropathy per patient. Last hemoglobin A1c 9.6% on August 01, 2019. Carbohydrate modified diet. Decrease insulin to previous dosage to avoid hypoglycemia. Continue Metformin 500 mg p.o. twice daily. CBG monitoring before meals and bedtime. Check hemoglobin A1c.    Hypothyroidism Check TSH level. Resume or adjust level thyroxine depending on result.    RLS (restless legs syndrome) Continue ropirinole 2 mg p.o. twice daily. Continue evening Requip ER 4 mg.    OSA on CPAP Per patient, he has been compliant. Continue CPAP at bedtime.   DVT prophylaxis: On heparin infusion. Code Status:   Full code. Family Communication:  His a  spouse was present in the ED room. Disposition Plan:   Patient is from:  Home.  Anticipated DC to:  Home.  Anticipated DC date:  12/06/2019.  Anticipated DC barriers: Clinical status and cardiology consult.  Consults called:  Routine cardiology consult. Admission status:  Observation/stepdown.   Severity of Illness: High given acute coronary  syndrome in the setting of new onset atrial fibrillation with RVR.  The patient needs to remain in the hospital for anticoagulation and cardiology evaluation.  Paul Milan MD [facility]  How to contact the [facility] Attending or Consulting provider [place] or covering provider during after hours [place], for this patient?   1. Check the care team in [facility] and look for a) attending/consulting [facility] provider listed and b) the [facility] team listed 2. Log into [email or web address] and use [facility]'s universal password to access. If you do not have the password, please contact the hospital operator. 3. Locate the [facility] provider you are looking for under [facility] and page to a number that you can be directly reached. 4. If you still have difficulty reaching the provider, please page the [facility] (Director on Call) for the Hospitalists listed on amion for assistance.  12/05/2019, 5:32 AM   This document was prepared using Dragon voice recognition software and may contain some unintended transcription errors.

## 2019-12-05 NOTE — Progress Notes (Signed)
ANTICOAGULATION CONSULT NOTE - Initial Consult  Pharmacy Consult for Heparin  Indication: chest pain/ACS and atrial fibrillation  Allergies  Allergen Reactions  . Tape Rash    Pulls off skin  . Cardizem [Diltiazem Hcl]     Edema   . Cardura [Doxazosin Mesylate]     Headaches / cramps  . Lipitor [Atorvastatin] Other (See Comments)    Leg cramps  . Paxil [Paroxetine Hcl]     Unknown reaction   . Codeine Rash and Other (See Comments)    Headache   . Gabapentin Rash    Patient Measurements: Height: 5\' 5"  (165.1 cm) Weight: 103 kg (227 lb 1.2 oz) IBW/kg (Calculated) : 61.5  Vital Signs: Temp: 98.4 F (36.9 C) (12/03 0058) Temp Source: Oral (12/03 0058) BP: 132/63 (12/03 0500) Pulse Rate: 65 (12/03 0500)  Labs: Recent Labs    12/05/19 0115 12/05/19 0306  HGB 13.1  --   HCT 40.8  --   PLT 218  --   CREATININE 1.20  --   TROPONINIHS 6 15    Estimated Creatinine Clearance: 65.1 mL/min (by C-G formula based on SCr of 1.2 mg/dL).   Medical History: Past Medical History:  Diagnosis Date  . Arthritis   . Asthma   . Colon polyp   . Coronary atherosclerosis of native coronary artery    a. 2011: cath showing 90% stenosis along small non-dominant RCA (too small for PCI). b. 01/2018: cath showing nonobstructive CAD with 60 to 70% proximal to mid nondominant RCA stenosis, 50% mid LAD and 40 to 50% OM1  . DVT (deep venous thrombosis) ([place]) 2005   Right arm  . Essential hypertension, benign   . Headache   . History of transfusion   . Hypersomnia    CPAP of 16 cm, diagnosed with AHI of 60 in 2012,epworth 21- narcolepsy?  [name] Hypothyroidism   . Mixed hyperlipidemia   . Morbid obesity ([place])   . MRSA (methicillin resistant staph aureus) culture positive    08/2012  . Narcolepsy   . OSA (obstructive sleep apnea)    CPAP  . Osteoarthritis   . Pneumonia   . Psoriasis   . Pulmonary embolism ([place]) 2004  . RLS (restless legs syndrome)   . Rotator cuff disorder    Left  .  Septic arthritis of knee, left ([place])   . Skin cancer, basal cell   . Type 2 diabetes mellitus [facility])     Assessment: 68 y/o M with chest pain. Also noted in afib that spontaneously converted back to NSR. No anti-coagulation PTA. Starting heparin. CBC/renal function good.   Goal of Therapy:  Heparin level 0.3-0.7 units/ml Monitor platelets by anticoagulation protocol: Yes   Plan:  Heparin 5000 units BOLUS Start heparin drip at 1300 units/hr 1400 HL Daily CBC/HL Monitor for bleeding  Narda Bonds, PharmD, BCPS Clinical Pharmacist Phone: [phone]

## 2019-12-05 NOTE — Discharge Instructions (Signed)
1)-Take Toprol-XL 1 tablet (50 mg) along with an additional 25 mg tablet for total of 75 mg twice daily 2)Stop Plavix (clopidogrel), continue baby aspirin 81 mg with breakfast 3) you are taking aspirin and Eliquis which are blood thinners so please Avoid ibuprofen/Advil/Aleve/Motrin/Goody Powders/Naproxen/BC powders/Meloxicam/Diclofenac/Indomethacin and other Nonsteroidal anti-inflammatory medications as these will make you more likely to bleed and can cause stomach ulcers, can also cause Kidney problems.  4) please follow-up with cardiologist as scheduled later this month for reevaluation and recheck

## 2019-12-05 NOTE — Progress Notes (Signed)
Discharge instructions reviewed with patient and patient's wife. Both verbalized understanding of instructions.  patient discharged home with wife in stable condition.

## 2019-12-05 NOTE — Discharge Summary (Signed)
Paul Baker, is a 69 y.o. male  DOB Mar 13, 1950  MRN [id number].  Admission date:  12/05/2019  Admitting Physician  Reubin Milan, MD  Discharge Date:  12/05/2019   Primary MD  Erven Colla, DO  Recommendations for primary care physician for things to follow:   1)-Take Toprol-XL 1 tablet (50 mg) along with an additional 25 mg tablet for total of 75 mg twice daily 2)Stop Plavix (clopidogrel), continue baby aspirin 81 mg with breakfast 3) you are taking aspirin and Eliquis which are blood thinners so please Avoid ibuprofen/Advil/Aleve/Motrin/Goody Powders/Naproxen/BC powders/Meloxicam/Diclofenac/Indomethacin and other Nonsteroidal anti-inflammatory medications as these will make you more likely to bleed and can cause stomach ulcers, can also cause Kidney problems.  4) please follow-up with cardiologist as scheduled later this month for reevaluation and recheck  Admission Diagnosis  Unstable angina ([place]) [I20.0] Paroxysmal atrial fibrillation (HCC) [I48.0] Acute coronary syndrome without high troponin (HCC) [I24.9]   Discharge Diagnosis  Unstable angina ([place]) [I20.0] Paroxysmal atrial fibrillation (HCC) [I48.0] Acute coronary syndrome without high troponin (HCC) [I24.9]    Principal Problem:   Acute coronary syndrome without high troponin (HCC) Active Problems:   Essential hypertension, benign   OSA on CPAP   RLS (restless legs syndrome)   Mixed hyperlipidemia   Class 2 obesity   Type 2 diabetes mellitus (HCC)   Paroxysmal atrial fibrillation (HCC)   Hypothyroidism   Grade I diastolic dysfunction      Past Medical History:  Diagnosis Date  . Arthritis   . Asthma   . Colon polyp   . Coronary atherosclerosis of native coronary artery    a. 2011: cath showing 90% stenosis along small non-dominant RCA (too small for PCI). b. 01/2018: cath showing nonobstructive CAD with 60 to 70%  proximal to mid nondominant RCA stenosis, 50% mid LAD and 40 to 50% OM1  . DVT (deep venous thrombosis) ([place]) 2005   Right arm  . Essential hypertension, benign   . Grade I diastolic dysfunction 46/06/5991  . Headache   . History of transfusion   . Hypersomnia    CPAP of 16 cm, diagnosed with AHI of 60 in 2012,epworth 21- narcolepsy?  [name] Hypothyroidism   . Mixed hyperlipidemia   . Morbid obesity ([facility])   . MRSA (methicillin resistant staph aureus) culture positive    08/2012  . Narcolepsy   . OSA (obstructive sleep apnea)    CPAP  . Osteoarthritis   . Pneumonia   . Psoriasis   . Pulmonary embolism ([place]) 2004  . RLS (restless legs syndrome)   . Rotator cuff disorder    Left  . Septic arthritis of knee, left ([place])   . Skin cancer, basal cell   . Type 2 diabetes mellitus ([place])     Past Surgical History:  Procedure Laterality Date  . BACK SURGERY    . CATARACT EXTRACTION Bilateral   . COLONOSCOPY    . CYST REMOVAL TRUNK     from back  . EYE SURGERY Left 2016  laser to left eye  . HIP SURGERY     bone removed from both sides of hip  . KNEE ARTHROTOMY Right 12/04/2014   Procedure: KNEE ARTHROTOMY PATELLA LIGAMENT RECONSTRUSION AND REPAIR RIGHT KNEE;  Surgeon: Paralee Cancel, MD;  Location: [facility];  Service: Orthopedics;  Laterality: Right;  . KNEE SURGERY     X 25 TIMES  . LEFT HEART CATH AND CORONARY ANGIOGRAPHY N/A 01/17/2018   Procedure: LEFT HEART CATH AND CORONARY ANGIOGRAPHY;  Surgeon: Belva Crome, MD;  Location: [facility];  Service: Cardiovascular;  Laterality: N/A;  . LUMBAR DISC SURGERY     Left L3, L4, L5 discecotomy with decompression of L4 root  . TONSILLECTOMY    . TOTAL KNEE ARTHROPLASTY  2003   LEFT  . TOTAL KNEE ARTHROPLASTY Right 03/23/2014   Procedure: RIGHT TOTAL KNEE ARTHROPLASTY AND REMOVAL RIGHT TIBIAL  DEEP IMPLANT STAPLE;  Surgeon: Paralee Cancel, MD;  Location: [facility];  Service: Orthopedics;  Laterality: Right;  . TOTAL KNEE REVISION  2005    LEFT  . WRIST SURGERY       HPI  from the history and physical done on the day of admission:    Chief Complaint: Chest pain.  HPI: Paul Baker is a 69 y.o. male with medical history significant of osteoarthritis, asthma/COPD, colon polyps, coronary artery disease, hypertension, history of DVT and pulmonary embolism in 2004, headache, history of blood transfusion, hypersomnia, OSA on CPAP, restless leg syndrome, type 2 diabetes, mixed hyperlipidemia, morbid obesity, hypothyroidism, history of pneumonia, psoriasis, history of left knee septic arthritis, basal cell skin cancer who is coming to the emergency department with complaints of pressure-like chest pain radiated to his left shoulder, cervical and mandibular area associated with dyspnea, lightheadedness that woke him up from sleep around 2330 yesterday.  The pain was relieved by SL NTG x3 and was not worsened by exertion, although the patient did not exert significantly during the episode.  He mentions that he has had chest pain episodes before, but nothing with this much intensity. He is not sure about having palpitations, denies nausea, emesis, diaphoresis, PND or orthopnea.   He frequently gets lower extremity edema.  He denies fever, chills, rhinorrhea, sore throat or hemoptysis.  He occasionally gets dyspneic with wheezing.  He denies abdominal pain, diarrhea, constipation, melena or hematochezia.  No dysuria, frequency or hematuria.  He occasionally gets blurred vision, he has been urinating a lot, but he some furosemide and states he drinks a good amount of water daily.  No polyphagia.  His insulin was recently increased from 70 -80 twice a day to 78-85 twice a day.  However, he mentions that he has been getting episodes of hypoglycemia since the insulin dosage was increased within the last 2 weeks.  ED Course: Initial vital signs were temperature 98.4 F, pulse 122, respirations 21, blood pressure 155/106 mmHg and O2 sat 95% on room  air.  The patient received 3 sublingual nitroglycerin tablets and 324 mg of chewable aspirin in the emergency department.  I started heparin infusion after explaining to the patient risks and benefits, ordered a one-time dose of K-Lor 40 mEq p.o. and magnesium sulfate 2 g IVPB.  Labwork: CBC was normal.  Troponin #1 was 6 troponin #2 was 15 ng/L.  BNP showed normal electrolytes and creatinine.  Glucose 112 and BUN 28 mg/dL.EKG #1 shows atrial fibrillation with RVR at 129 bpm.  EKG #2 sinus rhythm with left axis deviation.  Imaging: A  2 view chest radiograph did not show any active cardiopulmonary disease.  Please see images and full regular report for further detail.     Hospital Course:     1)New onset atrial fibrillation--Pt is  back to sinus rhythm  -Discussed with patient's cardiologist okay to discharge on Toprol-XL 75 mg twice daily along with Eliquis -Treated with IV heparin while in the hospital, - CHA2DS2- VASc score   is =  5 (h/o CVA x 2, HTN, DM2, PAD/CAD-   Which is  equal to = 7.2 % annual risk of stroke  This patients CHA2DS2-VASc Score and unadjusted Ischemic Stroke Rate (% per year) is equal to 7.2 % stroke rate/year from a score of 5 -Echo from August 2021 with EF of 55 to 04% without diastolic dysfunction, and no significant atrial enlargement or significant valvular abnormalities  2)Hypothyroidism--- TSH 1.63 continue Euthyrox  3)DM2-A1c is 8.7 reflecting uncontrolled diabetes with hyperglycemia PTA -Continue insulin regimen and follow-up with Dr. Loni Beckwith who is patient's primary endocrinologist for further adjustment -Continue Metformin  4)H/o Prior CVA--please see brain MRI from 08/22/2019, okay to continue aspirin Plavix has been discontinued due to initiation of Eliquis for A. Fib  5)CAD-LHC report from January 2020 noted, medical management advised at that time -Continue metoprolol , pravastatin and aspirin  6) class II obesity- -Low calorie diet,  portion control and increase physical activity discussed with patient -Body mass index is 37.79 kg/m.  7)OSA--continue CPAP  Discharge Condition: stable  Follow UP   Follow-up Information    Satira Sark, MD Follow up on 12/25/2019.   Specialty: Cardiology Why: Cardiology Hospital Follow-up on 12/25/2019 at 2:20 PM.  Contact information: [place] [address] [phone]                Consults obtained -curbside conversations with patient's cardiologist Dr. Domenic Polite  Diet and Activity recommendation:  As advised  Discharge Instructions    Discharge Instructions    Amb referral to AFIB Clinic   Complete by: As directed    Call MD for:  difficulty breathing, headache or visual disturbances   Complete by: As directed    Call MD for:  persistant dizziness or light-headedness   Complete by: As directed    Call MD for:  persistant nausea and vomiting   Complete by: As directed    Call MD for:  temperature >100.4   Complete by: As directed    Diet - low sodium heart healthy   Complete by: As directed    Diet Carb Modified   Complete by: As directed    Discharge instructions   Complete by: As directed    1)-Take Toprol-XL 1 tablet (50 mg) along with an additional 25 mg tablet for total of 75 mg twice daily 2)Stop Plavix (clopidogrel), continue baby aspirin 81 mg with breakfast 3) you are taking aspirin and Eliquis which are blood thinners so please Avoid ibuprofen/Advil/Aleve/Motrin/Goody Powders/Naproxen/BC powders/Meloxicam/Diclofenac/Indomethacin and other Nonsteroidal anti-inflammatory medications as these will make you more likely to bleed and can cause stomach ulcers, can also cause Kidney problems.  4) please follow-up with cardiologist as scheduled later this month for reevaluation and recheck   Increase activity slowly   Complete by: As directed         Discharge Medications     Allergies as of 12/05/2019      Reactions   Tape  Rash   Pulls off skin   Cardizem [diltiazem Hcl]    Edema  Cardura [doxazosin Mesylate]    Headaches / cramps   Lipitor [atorvastatin] Other (See Comments)   Leg cramps   Paxil [paroxetine Hcl]    Unknown reaction    Codeine Rash, Other (See Comments)   Headache    Gabapentin Rash      Medication List    STOP taking these medications   amoxicillin-clavulanate 875-125 MG tablet Commonly known as: AUGMENTIN   clopidogrel 75 MG tablet Commonly known as: PLAVIX     TAKE these medications   acetaminophen 325 MG tablet Commonly known as: TYLENOL Take 2 tablets (650 mg total) by mouth every 6 (six) hours as needed for mild pain (or Fever >/= 101).   albuterol 108 (90 Base) MCG/ACT inhaler Commonly known as: VENTOLIN HFA Inhale 2 puffs into the lungs every 6 (six) hours as needed for wheezing or shortness of breath.   amLODipine 5 MG tablet Commonly known as: NORVASC Take 1 tablet (5 mg total) by mouth daily. What changed: See the new instructions.   apixaban 5 MG Tabs tablet Commonly known as: ELIQUIS Take 1 tablet (5 mg total) by mouth 2 (two) times daily. For stroke prevention   aspirin EC 81 MG tablet Take 1 tablet (81 mg total) by mouth daily with breakfast. Swallow whole.   B-D ULTRAFINE III SHORT PEN 31G X 8 MM Misc Generic drug: Insulin Pen Needle USING FIVE TIMES DAILY.   Breo Ellipta 200-25 MCG/INH Aepb Generic drug: fluticasone furoate-vilanterol Inhale 1 puff into the lungs daily.   Dexcom G6 Receiver Devi 1 Piece by Does not apply route as needed.   Dexcom G6 Sensor Misc 4 Pieces by Does not apply route once a week.   Euthyrox 137 MCG tablet Generic drug: levothyroxine TAKE 1 TABLET BY MOUTH ONCE DAILY BEFORE BREAKFAST What changed: See the new instructions.   furosemide 40 MG tablet Commonly known as: LASIX Take 40 mg by mouth. Take 40 mg alternating with 80 mg Daily   HumuLIN R U-500 KwikPen 500 UNIT/ML kwikpen Generic drug: insulin  regular human CONCENTRATED Inject 70-80 units into the skin three times daily with meals per sliding scale. What changed:   how much to take  how to take this  when to take this  additional instructions   isosorbide mononitrate 60 MG 24 hr tablet Commonly known as: IMDUR TAKE 1 TABLET BY MOUTH IN THE MORNING AND 1/2 (ONE-HALF) IN THE EVENING What changed:   how much to take  how to take this  when to take this  additional instructions   loratadine 10 MG tablet Commonly known as: CLARITIN Take 10 mg by mouth daily as needed for allergies.   losartan 50 MG tablet Commonly known as: COZAAR Take 1 tablet (50 mg total) by mouth daily.   metFORMIN 500 MG tablet Commonly known as: GLUCOPHAGE TAKE 1 TABLET BY MOUTH TWICE DAILY WITH MEALS   metoprolol succinate 25 MG 24 hr tablet Commonly known as: Toprol XL Take 1 tablet (25 mg total) by mouth See admin instructions. -Take 1 tablet (25 mg) along with an additional 50 mg tablet for total of 75 mg twice daily What changed: You were already taking a medication with the same name, and this prescription was added. Make sure you understand how and when to take each.   metoprolol succinate 50 MG 24 hr tablet Commonly known as: TOPROL-XL Take 1 tablet (50 mg total) by mouth 2 (two) times daily. -Take 1 tablet (50 mg) along with an additional 25  mg tablet for total of 75 mg twice daily What changed: additional instructions   modafinil 200 MG tablet Commonly known as: PROVIGIL TAKE ONE TABLET BY MOUTH ONE TIME DAILY   nitroGLYCERIN 0.4 MG SL tablet Commonly known as: NITROSTAT DISSOLVE 1 TABLET UNDER TONGUE EVERY 5 MINUTES UP TO 15 MIN FOR CHESTPAIN. IF NO RELIEF CALL 911. What changed:   how much to take  how to take this  when to take this  reasons to take this  additional instructions   ONE TOUCH ULTRA TEST test strip Generic drug: glucose blood TEST BLOOD SUGAR UP TO 4 TIMES DAILY.   OneTouch Delica Lancets 25Z  Misc USE AS DIRECTED TO TEST BLOOD SUGAR 4 TIMES DAILY.   pravastatin 40 MG tablet Commonly known as: PRAVACHOL Take 1 tablet (40 mg total) by mouth every evening.   rOPINIRole 4 MG tablet Commonly known as: REQUIP Take 1/2 (one-half) tablet by mouth twice daily What changed:   See the new instructions.  Another medication with the same name was removed. Continue taking this medication, and follow the directions you see here.   Vitamin D (Ergocalciferol) 1.25 MG (50000 UNIT) Caps capsule Commonly known as: DRISDOL Take 1 capsule by mouth once a week       Major procedures and Radiology Reports - PLEASE review detailed and final reports for all details, in brief -   DG Chest 2 View  Result Date: 12/05/2019 CLINICAL DATA:  Chest pain and shortness of breath EXAM: CHEST - 2 VIEW COMPARISON:  02/18/2019 FINDINGS: The heart size and mediastinal contours are within normal limits. Both lungs are clear. The visualized skeletal structures are unremarkable. IMPRESSION: No active cardiopulmonary disease. Electronically Signed   By: Inez Catalina M.D.   On: 12/05/2019 01:54    Micro Results   Recent Results (from the past 240 hour(s))  Resp Panel by RT-PCR (Flu A&B, Covid) Nasopharyngeal Swab     Status: None   Collection Time: 12/05/19  2:53 AM   Specimen: Nasopharyngeal Swab; Nasopharyngeal(NP) swabs in vial transport medium  Result Value Ref Range Status   SARS Coronavirus 2 by RT PCR NEGATIVE NEGATIVE Final    Comment: (NOTE) SARS-CoV-2 target nucleic acids are NOT DETECTED.  The SARS-CoV-2 RNA is generally detectable in upper respiratory specimens during the acute phase of infection. The lowest concentration of SARS-CoV-2 viral copies this assay can detect is 138 copies/mL. A negative result does not preclude SARS-Cov-2 infection and should not be used as the sole basis for treatment or other patient management decisions. A negative result may occur with  improper specimen  collection/handling, submission of specimen other than nasopharyngeal swab, presence of viral mutation(s) within the areas targeted by this assay, and inadequate number of viral copies(<138 copies/mL). A negative result must be combined with clinical observations, patient history, and epidemiological information. The expected result is Negative.  Fact Sheet for Patients:  EntrepreneurPulse.com.au  Fact Sheet for Healthcare Providers:  IncredibleEmployment.be  This test is no t yet approved or cleared by the [place] FDA and  has been authorized for detection and/or diagnosis of SARS-CoV-2 by FDA under an Emergency Use Authorization (EUA). This EUA will remain  in effect (meaning this test can be used) for the duration of the COVID-19 declaration under Section 564(b)(1) of the Act, 21 U.S.C.section 360bbb-3(b)(1), unless the authorization is terminated  or revoked sooner.       Influenza A by PCR NEGATIVE NEGATIVE Final   Influenza B by PCR NEGATIVE  NEGATIVE Final    Comment: (NOTE) The Xpert Xpress SARS-CoV-2/FLU/RSV plus assay is intended as an aid in the diagnosis of influenza from Nasopharyngeal swab specimens and should not be used as a sole basis for treatment. Nasal washings and aspirates are unacceptable for Xpert Xpress SARS-CoV-2/FLU/RSV testing.  Fact Sheet for Patients: EntrepreneurPulse.com.au  Fact Sheet for Healthcare Providers: IncredibleEmployment.be  This test is not yet approved or cleared by the [place] FDA and has been authorized for detection and/or diagnosis of SARS-CoV-2 by FDA under an Emergency Use Authorization (EUA). This EUA will remain in effect (meaning this test can be used) for the duration of the COVID-19 declaration under Section 564(b)(1) of the Act, 21 U.S.C. section 360bbb-3(b)(1), unless the authorization is terminated or revoked.  Performed at [facility], [address], [place], [place] [address]   MRSA PCR Screening     Status: None   Collection Time: 12/05/19  5:25 AM   Specimen: Nasal Mucosa; Nasopharyngeal  Result Value Ref Range Status   MRSA by PCR NEGATIVE NEGATIVE Final    Comment:        The GeneXpert MRSA Assay (FDA approved for NASAL specimens only), is one component of a comprehensive MRSA colonization surveillance program. It is not intended to diagnose MRSA infection nor to guide or monitor treatment for MRSA infections. Performed at [facility], [address] [place], [place] [address]     Today   Subjective    Paul Baker today has no new complaints, -Has been back in sinus rhythm, wife at bedside, patient ambulating without chest pains, no dyspnea on exertion,  and no dizziness or hypoxia        Patient has been seen and examined prior to discharge   Objective   Blood pressure (!) 155/61, pulse 67, temperature 98.3 F (36.8 C), temperature source Oral, resp. rate 15, height 5\' 5"  (1.651 m), weight 103 kg, SpO2 94 %.  No intake or output data in the 24 hours ending 12/05/19 1535  Exam Gen:- Awake Alert, no acute distress  HEENT:- [place].AT, No sclera icterus Neck-Supple Neck,No JVD,.  Lungs-  CTAB , good air movement bilaterally  CV- S1, S2 normal, regular Abd-  +ve B.Sounds, Abd Soft, No tenderness, increased truncal adiposity    Extremity/Skin:- No  edema,   good pulses Psych-affect is appropriate, oriented x3 Neuro-no new focal deficits, no tremors    Data Review   CBC w Diff:  Lab Results  Component Value Date   WBC 9.3 12/05/2019   HGB 13.1 12/05/2019   HGB 11.8 (L) 09/11/2018   HCT 40.8 12/05/2019   HCT 35.6 (L) 09/11/2018   PLT 218 12/05/2019   PLT 199 09/11/2018   LYMPHOPCT 15 04/27/2019   MONOPCT 11 04/27/2019   EOSPCT 3 04/27/2019   BASOPCT 1 04/27/2019    CMP:  Lab Results  Component Value Date   NA 139 12/05/2019   NA 140 12/04/2019   K 3.6 12/05/2019   CL  104 12/05/2019   CO2 25 12/05/2019   BUN 28 (H) 12/05/2019   BUN 20 12/04/2019   CREATININE 1.20 12/05/2019   CREATININE 1.11 02/06/2019   PROT 6.4 12/04/2019   ALBUMIN 4.1 12/04/2019   BILITOT 0.2 12/04/2019   ALKPHOS 76 12/04/2019   AST 15 12/04/2019   ALT 15 12/04/2019  .   Total Discharge time is about 33 minutes  Roxan Hockey M.D on 12/05/2019 at 3:35 PM  Go to [email or web address] -  for contact  info  [facility] - Office  [phone]

## 2019-12-09 ENCOUNTER — Telehealth: Payer: Self-pay | Admitting: Family Medicine

## 2019-12-09 DIAGNOSIS — G2581 Restless legs syndrome: Secondary | ICD-10-CM

## 2019-12-09 NOTE — Telephone Encounter (Signed)
I called the pharmacy [facility] and they needed refill as no refills available from the 08-2019 prescription. I called pt.  He is taking ropinirole ER 4mg  po qhs, then also takes ropinirole 4mg  tablets (taking 2mg  po bid).  Needs refill on ER tablets.

## 2019-12-09 NOTE — Telephone Encounter (Signed)
Pt called, inquiring why rOPINIRole (REQUIP) 4 MG tablet refill was refused. Would like a call from the nurse.

## 2019-12-10 DIAGNOSIS — G4733 Obstructive sleep apnea (adult) (pediatric): Secondary | ICD-10-CM | POA: Diagnosis not present

## 2019-12-10 MED ORDER — ROPINIROLE HCL ER 4 MG PO TB24: 4.0000 mg | ORAL_TABLET | Freq: Every day | ORAL | 1 refills | Status: DC

## 2019-12-10 NOTE — Telephone Encounter (Signed)
Spoke with patient and let him know that Amy NP approved Requip XL 4 mg QHS prescription and it was sent to [facility] in [place]. Pt verbalized understanding and appreciation.

## 2019-12-11 ENCOUNTER — Encounter: Payer: Self-pay | Admitting: Nurse Practitioner

## 2019-12-11 ENCOUNTER — Other Ambulatory Visit: Payer: Self-pay

## 2019-12-11 ENCOUNTER — Ambulatory Visit (INDEPENDENT_AMBULATORY_CARE_PROVIDER_SITE_OTHER): Payer: Medicare HMO | Admitting: Nurse Practitioner

## 2019-12-11 VITALS — BP 144/78 | HR 74 | Ht 65.0 in | Wt 225.0 lb

## 2019-12-11 DIAGNOSIS — E038 Other specified hypothyroidism: Secondary | ICD-10-CM

## 2019-12-11 DIAGNOSIS — E782 Mixed hyperlipidemia: Secondary | ICD-10-CM | POA: Diagnosis not present

## 2019-12-11 DIAGNOSIS — E1110 Type 2 diabetes mellitus with ketoacidosis without coma: Secondary | ICD-10-CM | POA: Diagnosis not present

## 2019-12-11 DIAGNOSIS — I10 Essential (primary) hypertension: Secondary | ICD-10-CM

## 2019-12-11 DIAGNOSIS — E1159 Type 2 diabetes mellitus with other circulatory complications: Secondary | ICD-10-CM

## 2019-12-11 MED ORDER — HUMULIN R U-500 KWIKPEN 500 UNIT/ML ~~LOC~~ SOPN: 75.0000 [IU] | PEN_INJECTOR | Freq: Three times a day (TID) | SUBCUTANEOUS | 2 refills | Status: DC

## 2019-12-11 NOTE — Patient Instructions (Signed)

## 2019-12-11 NOTE — Progress Notes (Signed)
12/11/2019   Endocrinology follow-up note   Subjective:    Patient ID: Paul Baker, male    DOB: May 26, 1950, PCP Erven Colla, DO   Past Medical History:  Diagnosis Date  . Arthritis   . Asthma   . Colon polyp   . Coronary atherosclerosis of native coronary artery    a. 2011: cath showing 90% stenosis along small non-dominant RCA (too small for PCI). b. 01/2018: cath showing nonobstructive CAD with 60 to 70% proximal to mid nondominant RCA stenosis, 50% mid LAD and 40 to 50% OM1  . DVT (deep venous thrombosis) ([place]) 2005   Right arm  . Essential hypertension, benign   . Grade I diastolic dysfunction 56/02/5636  . Headache   . History of transfusion   . Hypersomnia    CPAP of 16 cm, diagnosed with AHI of 60 in 2012,[place] 21- narcolepsy?  [name] Hypothyroidism   . Mixed hyperlipidemia   . Morbid obesity ([place])   . MRSA (methicillin resistant staph aureus) culture positive    08/2012  . Narcolepsy   . OSA (obstructive sleep apnea)    CPAP  . Osteoarthritis   . Pneumonia   . Psoriasis   . Pulmonary embolism ([place]) 2004  . RLS (restless legs syndrome)   . Rotator cuff disorder    Left  . Septic arthritis of knee, left ([place])   . Skin cancer, basal cell   . Type 2 diabetes mellitus ([place])    Past Surgical History:  Procedure Laterality Date  . BACK SURGERY    . CATARACT EXTRACTION Bilateral   . COLONOSCOPY    . CYST REMOVAL TRUNK     from back  . EYE SURGERY Left 2016   laser to left eye  . HIP SURGERY     bone removed from both sides of hip  . KNEE ARTHROTOMY Right 12/04/2014   Procedure: KNEE ARTHROTOMY PATELLA LIGAMENT RECONSTRUSION AND REPAIR RIGHT KNEE;  Surgeon: Paralee Cancel, MD;  Location: [facility];  Service: Orthopedics;  Laterality: Right;  . KNEE SURGERY     X 25 TIMES  . LEFT HEART CATH AND CORONARY ANGIOGRAPHY N/A 01/17/2018   Procedure: LEFT HEART CATH AND CORONARY ANGIOGRAPHY;  Surgeon: Belva Crome, MD;  Location: [facility];  Service:  Cardiovascular;  Laterality: N/A;  . LUMBAR DISC SURGERY     Left L3, L4, L5 discecotomy with decompression of L4 root  . TONSILLECTOMY    . TOTAL KNEE ARTHROPLASTY  2003   LEFT  . TOTAL KNEE ARTHROPLASTY Right 03/23/2014   Procedure: RIGHT TOTAL KNEE ARTHROPLASTY AND REMOVAL RIGHT TIBIAL  DEEP IMPLANT STAPLE;  Surgeon: Paralee Cancel, MD;  Location: [facility];  Service: Orthopedics;  Laterality: Right;  . TOTAL KNEE REVISION  2005   LEFT  . WRIST SURGERY     Social History   Socioeconomic History  . Marital status: Married    Spouse name: Toney Reil  . Number of children: 2  . Years of education: college  . Highest education level: Not on file  Occupational History  . Occupation: Disabled    Employer: UNEMPLOYED  Tobacco Use  . Smoking status: Former Smoker    Packs/day: 1.50    Years: 10.00    Pack years: 15.00    Types: Cigarettes    Start date: 06/19/1967    Quit date: 01/03/1995    Years since quitting: 24.9  . Smokeless tobacco: Never Used  Vaping Use  . Vaping Use: Never used  Substance and Sexual Activity  . Alcohol use: No    Alcohol/week: 0.0 standard drinks    Comment: quit drinking in 07/86  . Drug use: No  . Sexual activity: Yes    Partners: Female  Other Topics Concern  . Not on file  Social History Narrative    69 year old, right-handed, caucasian male with a past medical history of obesity, hypertension, hyperlipidemia, diabetes, obstructive sleep apnea, presenting with frequent nighttime awakenings, excessive daytime sleepiness, also transient confusional episodes.RLS and one beosity, OSA on CPAP with AHI of 3.2 and  setting of 16 cm water , [facility] .   Social Determinants of Health   Financial Resource Strain: Not on file  Food Insecurity: Not on file  Transportation Needs: Not on file  Physical Activity: Not on file  Stress: Not on file  Social Connections: Not on file   Outpatient Encounter Medications as of 12/11/2019  Medication Sig  .  acetaminophen (TYLENOL) 325 MG tablet Take 2 tablets (650 mg total) by mouth every 6 (six) hours as needed for mild pain (or Fever >/= 101).  [name] albuterol (VENTOLIN HFA) 108 (90 Base) MCG/ACT inhaler Inhale 2 puffs into the lungs every 6 (six) hours as needed for wheezing or shortness of breath.  [name] amLODipine (NORVASC) 5 MG tablet Take 1 tablet (5 mg total) by mouth daily.  [name] apixaban (ELIQUIS) 5 MG TABS tablet Take 1 tablet (5 mg total) by mouth 2 (two) times daily. For stroke prevention  . aspirin EC 81 MG tablet Take 1 tablet (81 mg total) by mouth daily with breakfast. Swallow whole.  . B-D ULTRAFINE III SHORT PEN 31G X 8 MM MISC USING FIVE TIMES DAILY.  [name] Continuous Blood Gluc Receiver (DEXCOM G6 RECEIVER) DEVI 1 Piece by Does not apply route as needed.  . Continuous Blood Gluc Sensor (DEXCOM G6 SENSOR) MISC 4 Pieces by Does not apply route once a week.  Arna Medici 137 MCG tablet TAKE 1 TABLET BY MOUTH ONCE DAILY BEFORE BREAKFAST (Patient taking differently: Take 137 mcg by mouth daily before breakfast.)  . fluticasone furoate-vilanterol (BREO ELLIPTA) 200-25 MCG/INH AEPB Inhale 1 puff into the lungs daily.  . furosemide (LASIX) 40 MG tablet Take 40 mg by mouth. Take 40 mg alternating with 80 mg Daily  . isosorbide mononitrate (IMDUR) 60 MG 24 hr tablet TAKE 1 TABLET BY MOUTH IN THE MORNING AND 1/2 (ONE-HALF) IN THE EVENING (Patient taking differently: Take 30-60 mg by mouth in the morning and at bedtime. 1 tablet in am, half a tablet in the evening)  . loratadine (CLARITIN) 10 MG tablet Take 10 mg by mouth daily as needed for allergies.   [name] losartan (COZAAR) 50 MG tablet Take 1 tablet (50 mg total) by mouth daily.  . metFORMIN (GLUCOPHAGE) 500 MG tablet TAKE 1 TABLET BY MOUTH TWICE DAILY WITH MEALS (Patient taking differently: Take 500 mg by mouth 2 (two) times daily with a meal.)  . metoprolol succinate (TOPROL XL) 25 MG 24 hr tablet Take 1 tablet (25 mg total) by mouth See admin instructions.  -Take 1 tablet (25 mg) along with an additional 50 mg tablet for total of 75 mg twice daily  . metoprolol succinate (TOPROL-XL) 50 MG 24 hr tablet Take 1 tablet (50 mg total) by mouth 2 (two) times daily. -Take 1 tablet (50 mg) along with an additional 25 mg tablet for total of 75 mg twice daily  . modafinil (PROVIGIL) 200 MG tablet TAKE ONE TABLET BY MOUTH  ONE TIME DAILY  . nitroGLYCERIN (NITROSTAT) 0.4 MG SL tablet DISSOLVE 1 TABLET UNDER TONGUE EVERY 5 MINUTES UP TO 15 MIN FOR CHESTPAIN. IF NO RELIEF CALL 911. (Patient taking differently: Place 0.4 mg under the tongue every 5 (five) minutes as needed for chest pain.)  . ONE TOUCH ULTRA TEST test strip TEST BLOOD SUGAR UP TO 4 TIMES DAILY.  [name] ONETOUCH DELICA LANCETS 16W MISC USE AS DIRECTED TO TEST BLOOD SUGAR 4 TIMES DAILY.  . pravastatin (PRAVACHOL) 40 MG tablet Take 1 tablet (40 mg total) by mouth every evening.  [name] rOPINIRole (REQUIP XL) 4 MG 24 hr tablet Take 1 tablet (4 mg total) by mouth at bedtime.  [name] rOPINIRole (REQUIP) 4 MG tablet Take 1/2 (one-half) tablet by mouth twice daily (Patient taking differently: Take 2 mg by mouth 2 (two) times daily.)  . Vitamin D, Ergocalciferol, (DRISDOL) 1.25 MG (50000 UNIT) CAPS capsule Take 1 capsule by mouth once a week  . [DISCONTINUED] insulin regular human CONCENTRATED (HUMULIN R U-500 KWIKPEN) 500 UNIT/ML kwikpen Inject 70-80 units into the skin three times daily with meals per sliding scale. (Patient taking differently: Inject 70-80 Units into the skin with breakfast, with lunch, and with evening meal. per sliding scale.)  . insulin regular human CONCENTRATED (HUMULIN R U-500 KWIKPEN) 500 UNIT/ML kwikpen Inject 75-85 Units into the skin 3 (three) times daily with meals. Inject 85 units with breakfast and lunch, and 75 units with supper if glucose is above 90 and you are eating   No facility-administered encounter medications on file as of 12/11/2019.   ALLERGIES: Allergies  Allergen Reactions  .  Tape Rash    Pulls off skin  . Cardizem [Diltiazem Hcl]     Edema   . Cardura [Doxazosin Mesylate]     Headaches / cramps  . Lipitor [Atorvastatin] Other (See Comments)    Leg cramps  . Paxil [Paroxetine Hcl]     Unknown reaction   . Codeine Rash and Other (See Comments)    Headache   . Gabapentin Rash   VACCINATION STATUS: Immunization History  Administered Date(s) Administered  . Influenza Split 09/18/2012  . Influenza,inj,Quad PF,6+ Mos 10/13/2013, 10/06/2014, 09/11/2018  . Influenza-Unspecified 12/01/2015, 11/08/2016  . PFIZER SARS-COV-2 Vaccination 03/06/2019, 03/27/2019  . Pneumococcal Polysaccharide-23 10/02/2004, 10/03/2011  . Td 05/24/2006    Diabetes He presents for his follow-up diabetic visit. He has type 2 diabetes mellitus. Onset time: Was diagnosed at approximate age of 62 years. His disease course has been improving. Hypoglycemia symptoms include sweats. Pertinent negatives for hypoglycemia include no confusion, headaches, nervousness/anxiousness, pallor, seizures or tremors. Associated symptoms include foot paresthesias and polyuria. Pertinent negatives for diabetes include no chest pain, no fatigue, no polydipsia, no polyphagia, no weakness and no weight loss. Hypoglycemia complications include nocturnal hypoglycemia. Symptoms are improving. Diabetic complications include a CVA, heart disease, nephropathy and peripheral neuropathy. (Has had 2 mini strokes since last visit) Risk factors for coronary artery disease include diabetes mellitus, dyslipidemia, male sex, obesity, sedentary lifestyle and tobacco exposure. Current diabetic treatment includes intensive insulin program and oral agent (dual therapy). He is compliant with treatment most of the time. His weight is fluctuating minimally. He has had a previous visit with a dietitian. He never participates in exercise. His home blood glucose trend is fluctuating dramatically. (He presents today with his CGM and logs showing  widely fluctuating glycemic profile.  His previsit A1c was 8.7%, improving since last visit of 9.6%.  He still is having some nocturnal  hypoglycemia (2 days a week approximately).  He reports he does not snack before bed.  He has been in and out of the hospital a few times since last visit for multiple mild strokes and AFib. ) An ACE inhibitor/angiotensin II receptor blocker is being taken. He sees a podiatrist.Eye exam is current.  Hyperlipidemia This is a chronic problem. The current episode started more than 1 year ago. The problem is uncontrolled. Recent lipid tests were reviewed and are high. Exacerbating diseases include chronic renal disease, diabetes, hypothyroidism and obesity. There are no known factors aggravating his hyperlipidemia. Pertinent negatives include no chest pain, myalgias or shortness of breath. Current antihyperlipidemic treatment includes statins. The current treatment provides mild improvement of lipids. Compliance problems include adherence to diet and adherence to exercise.  Risk factors for coronary artery disease include dyslipidemia, diabetes mellitus, hypertension, male sex, obesity and a sedentary lifestyle.  Hypertension This is a chronic problem. The current episode started more than 1 year ago. The problem has been gradually improving since onset. The problem is controlled. Associated symptoms include sweats. Pertinent negatives include no chest pain, headaches, neck pain, palpitations or shortness of breath. Agents associated with hypertension include thyroid hormones. Risk factors for coronary artery disease include diabetes mellitus, dyslipidemia, male gender, obesity, sedentary lifestyle and smoking/tobacco exposure. Past treatments include angiotensin blockers, beta blockers and diuretics. Compliance problems include exercise and diet.  Hypertensive end-organ damage includes kidney disease, CAD/MI and CVA. Identifiable causes of hypertension include chronic renal  disease and a thyroid problem.  Thyroid Problem Presents for follow-up visit. Symptoms include leg swelling. Patient reports no anxiety, cold intolerance, constipation, depressed mood, diarrhea, fatigue, heat intolerance, palpitations, tremors, weight gain or weight loss. The symptoms have been stable. Past treatments include levothyroxine. His past medical history is significant for diabetes and hyperlipidemia.     Review of systems  Constitutional: + Minimally fluctuating body weight,  current Body mass index is 37.44 kg/m. , no fatigue, no subjective hyperthermia, no subjective hypothermia, reports mild memory impairment since mini strokes Eyes: no blurry vision, no xerophthalmia ENT: no sore throat, no nodules palpated in throat, no dysphagia/odynophagia, no hoarseness Cardiovascular: no chest pain, no shortness of breath, no palpitations, mild leg swelling Respiratory: no cough, no shortness of breath Gastrointestinal: no nausea/vomiting/diarrhea Musculoskeletal: no muscle/joint aches Skin: no rashes, no hyperemia Neurological: no tremors, no numbness, no tingling, no dizziness, + intermittent numbness/tingling to bilateral feet Psychiatric: no depression, no anxiety    Objective:    BP (!) 144/78 (BP Location: Left Arm)   Pulse 74   Ht 5\' 5"  (1.651 m)   Wt 225 lb (102.1 kg)   BMI 37.44 kg/m   Wt Readings from Last 3 Encounters:  12/11/19 225 lb (102.1 kg)  12/05/19 227 lb 1.2 oz (103 kg)  11/26/19 229 lb (103.9 kg)     BP Readings from Last 3 Encounters:  12/11/19 (!) 144/78  12/05/19 (!) 155/61  11/26/19 (!) 147/72     Physical Exam- Limited  Constitutional:  Body mass index is 37.44 kg/m. , not in acute distress, normal state of mind Eyes:  EOMI, no exophthalmos Neck: Supple Cardiovascular: Irregular (hx afib), no murmers, rubs, or gallops, 1+ pitting edema to BLE Respiratory: Adequate breathing efforts, no crackles, rales, rhonchi, or  wheezing Musculoskeletal: no gross deformities, strength intact in all four extremities, no gross restriction of joint movements Skin:  no rashes, no hyperemia Neurological: no tremor with outstretched hands   Foot exam:   No  rashes, ulcers, cuts, calluses, onychodystrophy.   Good pulses bilat.  Decreased sensation to 10 g monofilament bilat.   POCT ABI Results 12/11/19   Right ABI:  1.15      Left ABI:  1.11  Right leg systolic / diastolic: 425/95 mmHg Left leg systolic / diastolic: 638/75 mmHg  Arm systolic / diastolic: 643/32 mmHG  Detailed report will be scanned into patient chart.   CMP Latest Ref Rng & Units 12/05/2019 12/04/2019 08/05/2019  Glucose 70 - 99 mg/dL 112(H) 193(H) 336(H)  BUN 8 - 23 mg/dL 28(H) 20 22  Creatinine 0.61 - 1.24 mg/dL 1.20 1.02 0.94  Sodium 135 - 145 mmol/L 139 140 135  Potassium 3.5 - 5.1 mmol/L 3.6 4.4 4.2  Chloride 98 - 111 mmol/L 104 101 102  CO2 22 - 32 mmol/L 25 21 25   Calcium 8.9 - 10.3 mg/dL 9.2 9.7 9.0  Total Protein 6.0 - 8.5 g/dL - 6.4 6.9  Total Bilirubin 0.0 - 1.2 mg/dL - 0.2 0.4  Alkaline Phos 44 - 121 IU/L - 76 52  AST 0 - 40 IU/L - 15 15  ALT 0 - 44 IU/L - 15 18     Diabetic Labs (most recent): Lab Results  Component Value Date   HGBA1C 8.7 (H) 12/05/2019   HGBA1C 9.6 (A) 08/01/2019   HGBA1C 9.2 (H) 02/18/2019   Lipid Panel     Component Value Date/Time   CHOL 177 08/05/2019 0334   CHOL 148 08/19/2015 0914   TRIG 95 08/05/2019 0334   HDL 56 08/05/2019 0334   HDL 53 08/19/2015 0914   CHOLHDL 3.2 08/05/2019 0334   VLDL 19 08/05/2019 0334   LDLCALC 102 (H) 08/05/2019 0334   LDLCALC 118 (H) 02/06/2019 0813     Assessment & Plan:   1) Uncontrolled type 2 diabetes mellitus with circulatory complication, with long-term current use of insulin (HCC)  -His diabetes is  complicated by coronary artery disease and patient remains at an extremely high risk for more acute and chronic complications of diabetes which  include CAD, CVA, CKD, retinopathy, and neuropathy. These are all discussed in detail with the patient.  He presents today with his CGM and logs showing widely fluctuating glycemic profile.  His previsit A1c was 8.7%, improving since last visit of 9.6%.  He still is having some nocturnal hypoglycemia (2 days a week approximately).  He reports he does not snack before bed.  He has been in and out of the hospital a few times since last visit for multiple mild strokes and AFib.     Glucose logs and insulin administration records pertaining to this visit,  to be scanned into patient's records.  Recent labs reviewed.  - Nutritional counseling repeated at each appointment due to patients tendency to fall back in to old habits.  - The patient admits there is a room for improvement in their diet and drink choices. -  Suggestion is made for the patient to avoid simple carbohydrates from their diet including Cakes, Sweet Desserts / Pastries, Ice Cream, Soda (diet and regular), Sweet Tea, Candies, Chips, Cookies, Sweet Pastries,  Store Bought Juices, Alcohol in Excess of  1-2 drinks a day, Artificial Sweeteners, Coffee Creamer, and "Sugar-free" Products. This will help patient to have stable blood glucose profile and potentially avoid unintended weight gain.   - I encouraged the patient to switch to  unprocessed or minimally processed complex starch and increased protein intake (animal or plant source), fruits, and vegetables.   -  Patient is advised to stick to a routine mealtimes to eat 3 meals  a day and avoid unnecessary snacks ( to snack only to correct hypoglycemia).  - I have approached patient with the following individualized plan to manage diabetes and patient agrees.  -He will continue to need intensive treatment with higher dose of insulin.  He has been better with U500 versus [place] and regular insulin.     -Given his improvement in glycemic profile, he is advised to continue Humulin R U-500 at  85 units with breakfast and lunch and 75 units with supper (increased in between visits for hyperglycemia), and continue Metformin 500 mg po twice daily with meals and Glipizide 5 mg XL daily with breakfast.  -He is encouraged to continue using his CGM to monitor glucose 4 times daily, before meals and before bed, and to call the clinic if he has readings less than 70 or greater than 300 for 3 tests in a row.  2) BP/HTN:  His blood pressure is nearly controlled to target.  He is advised to continue Lasix 40 mg po daily, Losartan 50 mg po daily, and Metoprolol 75 mg po twice daily.  3) Lipids/HPL:  His most recent lipid panel from 08/05/19 shows uncontrolled LDL of 102 (improving).  He is advised to continue Pravastatin 40 mg po daily at bedtime.  Side effects and precautions discussed with him.  4)  Weight/Diet:  His Body mass index is 82.95 kg/m.-complicating his diabetes care.  He is a candidate for modest weight loss.  CDE consult in progress, exercise, and carbohydrates information provided.  5) Hypothyroidism -His recent thyroid function tests are consistent with appropriate replacement.  He is advised to continue  levothyroxine 137 mcg p.o. every morning before breakfast.     - We discussed about the correct intake of his thyroid hormone, on empty stomach at fasting, with water, separated by at least 30 minutes from breakfast and other medications,  and separated by more than 4 hours from calcium, iron, multivitamins, acid reflux medications (PPIs). -Patient is made aware of the fact that thyroid hormone replacement is needed for life, dose to be adjusted by periodic monitoring of thyroid function tests.  6) Chronic Care/Health Maintenance: -Patient is on ACEI/ARB and Statin medications and encouraged to continue to follow up with Ophthalmology, Podiatrist at least yearly or according to recommendations, and advised to  stay away from smoking. I have recommended yearly flu vaccine and  pneumonia vaccination at least every 5 years; moderate intensity exercise for up to 150 minutes weekly; and  sleep for at least 7 hours a day.  - I advised patient to maintain close follow up with Erven Colla, DO for primary care needs.  - Time spent on this patient care encounter:  35 min, of which > 50% was spent in  counseling and the rest reviewing his blood glucose logs , discussing his hypoglycemia and hyperglycemia episodes, reviewing his current and  previous labs / studies  ( including abstraction from other facilities) and medications  doses and developing a  long term treatment plan and documenting his care.   Please refer to Patient Instructions for Blood Glucose Monitoring and Insulin/Medications Dosing Guide"  in media tab for additional information. Please  also refer to " Patient Self Inventory" in the Media  tab for reviewed elements of pertinent patient history.  Paul Baker participated in the discussions, expressed understanding, and voiced agreement with the above plans.  All questions were answered to  his satisfaction. he is encouraged to contact clinic should he have any questions or concerns prior to his return visit.   Follow up plan: -Return in about 3 months (around 03/10/2020) for Diabetes follow up with A1c in office, Thyroid follow up, Previsit labs, Bring glucometer and logs.  Rayetta Pigg, [facility] [address] [place], [place] [address] Phone: [phone] Fax: [phone]  12/11/2019, 10:22 AM

## 2019-12-17 DIAGNOSIS — L821 Other seborrheic keratosis: Secondary | ICD-10-CM | POA: Diagnosis not present

## 2019-12-17 DIAGNOSIS — L820 Inflamed seborrheic keratosis: Secondary | ICD-10-CM | POA: Diagnosis not present

## 2019-12-17 DIAGNOSIS — L400 Psoriasis vulgaris: Secondary | ICD-10-CM | POA: Diagnosis not present

## 2019-12-24 ENCOUNTER — Encounter: Payer: Self-pay | Admitting: Cardiology

## 2019-12-24 NOTE — Progress Notes (Signed)
Cardiology Office Note  Date: 12/25/2019   ID: Paul Baker, DOB 19-Sep-1950, MRN [id number]  PCP:  Annalee Genta, DO  Cardiologist:  Nona Dell, MD Electrophysiologist:  None   Chief Complaint  Patient presents with  . Cardiac follow-up    History of Present Illness: Paul Baker is a 69 y.o. male last seen in June.  He presents for a post hospital follow-up, recently admitted with newly documented atrial fibrillation with RVR associated with unstable angina.  He ruled out for ACS with negative high-sensitivity troponin I levels, spontaneously converted to sinus rhythm with heart rate control.  Beta-blocker was uptitrated and his antiplatelet regimen was simplified to allow room for addition of Eliquis with CHA2DS2-VASc score of 5.  TSH was found to be normal.  He presents today without recurrent symptoms.  We discussed management of paroxysmal atrial fibrillation, he is tolerating combination of low-dose aspirin and Eliquis for now.  Heart rate is in the 70s and regular today.  He states that he has been compliant with CPAP for treatment of OSA.  Past Medical History:  Diagnosis Date  . Arthritis   . Asthma   . Atrial fibrillation [facility])    Diagnosed December 2021  . Colon polyp   . Coronary atherosclerosis of native coronary artery    a. 2011: cath showing 90% stenosis along small non-dominant RCA (too small for PCI). b. 01/2018: cath showing nonobstructive CAD with 60 to 70% proximal to mid nondominant RCA stenosis, 50% mid LAD and 40 to 50% OM1  . DVT (deep venous thrombosis) (HCC) 2005   Right arm  . Essential hypertension   . Headache   . History of transfusion   . Hypothyroidism   . Mixed hyperlipidemia   . Morbid obesity (HCC)   . MRSA (methicillin resistant staph aureus) culture positive    08/2012  . Narcolepsy   . OSA (obstructive sleep apnea)    CPAP  . Osteoarthritis   . Pneumonia   . Psoriasis   . Pulmonary embolism (HCC) 2004  . RLS  (restless legs syndrome)   . Rotator cuff disorder    Left  . Septic arthritis of knee, left (HCC)   . Skin cancer, basal cell   . Type 2 diabetes mellitus (HCC)     Past Surgical History:  Procedure Laterality Date  . BACK SURGERY    . CATARACT EXTRACTION Bilateral   . COLONOSCOPY    . CYST REMOVAL TRUNK     from back  . EYE SURGERY Left 2016   laser to left eye  . HIP SURGERY     bone removed from both sides of hip  . KNEE ARTHROTOMY Right 12/04/2014   Procedure: KNEE ARTHROTOMY PATELLA LIGAMENT RECONSTRUSION AND REPAIR RIGHT KNEE;  Surgeon: Durene Romans, MD;  Location: [facility] OR;  Service: Orthopedics;  Laterality: Right;  . KNEE SURGERY     X 25 TIMES  . LEFT HEART CATH AND CORONARY ANGIOGRAPHY N/A 01/17/2018   Procedure: LEFT HEART CATH AND CORONARY ANGIOGRAPHY;  Surgeon: Lyn Records, MD;  Location: [facility] INVASIVE CV LAB;  Service: Cardiovascular;  Laterality: N/A;  . LUMBAR DISC SURGERY     Left L3, L4, L5 discecotomy with decompression of L4 root  . TONSILLECTOMY    . TOTAL KNEE ARTHROPLASTY  2003   LEFT  . TOTAL KNEE ARTHROPLASTY Right 03/23/2014   Procedure: RIGHT TOTAL KNEE ARTHROPLASTY AND REMOVAL RIGHT TIBIAL  DEEP IMPLANT STAPLE;  Surgeon: Durene Romans,  MD;  Location: [facility] ORS;  Service: Orthopedics;  Laterality: Right;  . TOTAL KNEE REVISION  2005   LEFT  . WRIST SURGERY      Current Outpatient Medications  Medication Sig Dispense Refill  . acetaminophen (TYLENOL) 325 MG tablet Take 2 tablets (650 mg total) by mouth every 6 (six) hours as needed for mild pain (or Fever >/= 101). 30 tablet 1  . albuterol (VENTOLIN HFA) 108 (90 Base) MCG/ACT inhaler Inhale 2 puffs into the lungs every 6 (six) hours as needed for wheezing or shortness of breath. 18 g 5  . amLODipine (NORVASC) 5 MG tablet Take 1 tablet (5 mg total) by mouth daily. 30 tablet 5  . apixaban (ELIQUIS) 5 MG TABS tablet Take 1 tablet (5 mg total) by mouth 2 (two) times daily. For stroke prevention 60 tablet 5  .  aspirin EC 81 MG tablet Take 1 tablet (81 mg total) by mouth daily with breakfast. Swallow whole. 150 tablet 2  . B-D ULTRAFINE III SHORT PEN 31G X 8 MM MISC USING FIVE TIMES DAILY. 150 each 5  . Continuous Blood Gluc Receiver (DEXCOM G6 RECEIVER) DEVI 1 Piece by Does not apply route as needed. 1 Device 0  . Continuous Blood Gluc Sensor (DEXCOM G6 SENSOR) MISC 4 Pieces by Does not apply route once a week. 4 each 2  . EUTHYROX 137 MCG tablet TAKE 1 TABLET BY MOUTH ONCE DAILY BEFORE BREAKFAST (Patient taking differently: Take 137 mcg by mouth daily before breakfast.) 90 tablet 0  . fluticasone furoate-vilanterol (BREO ELLIPTA) 200-25 MCG/INH AEPB Inhale 1 puff into the lungs daily. 30 each 5  . furosemide (LASIX) 40 MG tablet Take 40 mg by mouth. Take 40 mg alternating with 80 mg Daily    . insulin regular human CONCENTRATED (HUMULIN R U-500 KWIKPEN) 500 UNIT/ML kwikpen Inject 75-85 Units into the skin 3 (three) times daily with meals. Inject 85 units with breakfast and lunch, and 75 units with supper if glucose is above 90 and you are eating 60 mL 2  . isosorbide mononitrate (IMDUR) 60 MG 24 hr tablet TAKE 1 TABLET BY MOUTH IN THE MORNING AND 1/2 (ONE-HALF) IN THE EVENING (Patient taking differently: Take 30-60 mg by mouth in the morning and at bedtime. 1 tablet in am, half a tablet in the evening) 135 tablet 3  . loratadine (CLARITIN) 10 MG tablet Take 10 mg by mouth daily as needed for allergies.     [name] losartan (COZAAR) 50 MG tablet Take 1 tablet (50 mg total) by mouth daily. 90 tablet 0  . metFORMIN (GLUCOPHAGE) 500 MG tablet TAKE 1 TABLET BY MOUTH TWICE DAILY WITH MEALS (Patient taking differently: Take 500 mg by mouth 2 (two) times daily with a meal.) 180 tablet 0  . metoprolol succinate (TOPROL XL) 25 MG 24 hr tablet Take 1 tablet (25 mg total) by mouth See admin instructions. -Take 1 tablet (25 mg) along with an additional 50 mg tablet for total of 75 mg twice daily 180 tablet 2  . metoprolol  succinate (TOPROL-XL) 50 MG 24 hr tablet Take 1 tablet (50 mg total) by mouth 2 (two) times daily. -Take 1 tablet (50 mg) along with an additional 25 mg tablet for total of 75 mg twice daily 180 tablet 3  . modafinil (PROVIGIL) 200 MG tablet TAKE ONE TABLET BY MOUTH ONE TIME DAILY 90 tablet 0  . nitroGLYCERIN (NITROSTAT) 0.4 MG SL tablet DISSOLVE 1 TABLET UNDER TONGUE EVERY 5 MINUTES UP  TO [place]. IF NO RELIEF CALL 911. (Patient taking differently: Place 0.4 mg under the tongue every 5 (five) minutes as needed for chest pain.) 25 tablet 3  . ONE TOUCH ULTRA TEST test strip TEST BLOOD SUGAR UP TO 4 TIMES DAILY. 150 each 5  . ONETOUCH DELICA LANCETS 99991111 MISC USE AS DIRECTED TO TEST BLOOD SUGAR 4 TIMES DAILY. 150 each 5  . pravastatin (PRAVACHOL) 40 MG tablet Take 1 tablet (40 mg total) by mouth every evening. 90 tablet 1  . rOPINIRole (REQUIP XL) 4 MG 24 hr tablet Take 1 tablet (4 mg total) by mouth at bedtime. 90 tablet 1  . rOPINIRole (REQUIP) 4 MG tablet Take 1/2 (one-half) tablet by mouth twice daily (Patient taking differently: Take 2 mg by mouth 2 (two) times daily.) 90 tablet 1  . Vitamin D, Ergocalciferol, (DRISDOL) 1.25 MG (50000 UNIT) CAPS capsule Take 1 capsule by mouth once a week 12 capsule 0   No current facility-administered medications for this visit.   Allergies:  Tape, Cardizem [diltiazem hcl], Cardura [doxazosin mesylate], Lipitor [atorvastatin], Paxil [paroxetine hcl], Codeine, and Gabapentin   ROS: No dizziness or syncope.  Physical Exam: VS:  BP (!) 146/78   Pulse 74   Ht 5\' 5"  (1.651 m)   Wt 227 lb (103 kg)   SpO2 97%   BMI 37.77 kg/m , BMI Body mass index is 37.77 kg/m.  Wt Readings from Last 3 Encounters:  12/25/19 227 lb (103 kg)  12/11/19 225 lb (102.1 kg)  12/05/19 227 lb 1.2 oz (103 kg)    General: Patient appears comfortable at rest. HEENT: Conjunctiva and lids normal, wearing a mask. Neck: Supple, no elevated JVP or carotid bruits, no  thyromegaly. Lungs: Clear to auscultation, nonlabored breathing at rest. Cardiac: Regular rate and rhythm, no S3 or significant systolic murmur. Extremities: No pitting edema.  ECG:  An ECG dated 12/05/2019 was personally reviewed today and demonstrated:  Atrial fibrillation with PVCs, old inferior infarct pattern.  Recent Labwork: 12/04/2019: ALT 15; AST 15 12/05/2019: BUN 28; Creatinine, Ser 1.20; Hemoglobin 13.1; Magnesium 2.0; Platelets 218; Potassium 3.6; Sodium 139; TSH 1.639     Component Value Date/Time   CHOL 177 08/05/2019 0334   CHOL 148 08/19/2015 0914   TRIG 95 08/05/2019 0334   HDL 56 08/05/2019 0334   HDL 53 08/19/2015 0914   CHOLHDL 3.2 08/05/2019 0334   VLDL 19 08/05/2019 0334   LDLCALC 102 (H) 08/05/2019 0334   LDLCALC 118 (H) 02/06/2019 0813    Other Studies Reviewed Today:  Echocardiogram 08/05/2019: 1. Left ventricular ejection fraction, by estimation, is 55 to 60%. The  left ventricle has normal function. The left ventricle has no regional  wall motion abnormalities. Left ventricular diastolic parameters were  normal.  2. Right ventricular systolic function is normal. The right ventricular  size is normal. Tricuspid regurgitation signal is inadequate for assessing  PA pressure.  3. Left atrial size was upper normal.  4. The mitral valve is grossly normal. Mild mitral valve regurgitation.  5. The aortic valve is tricuspid. Aortic valve regurgitation is not  visualized.  6. The inferior vena cava is normal in size with greater than 50%  respiratory variability, suggesting right atrial pressure of 3 mmHg.   Assessment and Plan:  1.  Paroxysmal atrial fibrillation, recently documented and with CHA2DS2-VASc score of 5.  He is in sinus rhythm at this time having spontaneously converted.  Continue current dose of Toprol-XL, also low-dose  aspirin and Eliquis for the time being.  He was previously on a combination of aspirin and Plavix following stroke (without  prior diagnosis of atrial fibrillation).  2.  CAD with moderate proximal to mid nondominant RCA stenosis that has been managed medically.  No clear evidence of ACS by cardiac enzymes during recent admission.  Continue low-dose aspirin, Norvasc, Imdur, losartan, Toprol-XL, and Pravachol.  Medication Adjustments/Labs and Tests Ordered: Current medicines are reviewed at length with the patient today.  Concerns regarding medicines are outlined above.   Tests Ordered: Orders Placed This Encounter  Procedures  . CBC  . Basic Metabolic Panel (BMET)    Medication Changes: No orders of the defined types were placed in this encounter.   Disposition:  Follow up 3 months in the [place] office.  Signed, Satira Sark, MD, [facility] 12/25/2019 3:33 PM    [place] [facility] [address], [place],  [address] Phone: [phone]; Fax: [phone]

## 2019-12-25 ENCOUNTER — Other Ambulatory Visit: Payer: Self-pay

## 2019-12-25 ENCOUNTER — Ambulatory Visit: Payer: Medicare HMO | Admitting: Cardiology

## 2019-12-25 ENCOUNTER — Encounter: Payer: Self-pay | Admitting: Cardiology

## 2019-12-25 VITALS — BP 146/78 | HR 74 | Ht 65.0 in | Wt 227.0 lb

## 2019-12-25 DIAGNOSIS — I25119 Atherosclerotic heart disease of native coronary artery with unspecified angina pectoris: Secondary | ICD-10-CM

## 2019-12-25 DIAGNOSIS — I480 Paroxysmal atrial fibrillation: Secondary | ICD-10-CM | POA: Diagnosis not present

## 2019-12-25 NOTE — Patient Instructions (Signed)
Medication Instructions:  Your physician recommends that you continue on your current medications as directed. Please refer to the Current Medication list given to you today.  *If you need a refill on your cardiac medications before your next appointment, please call your pharmacy*   Lab Work: CBC,BMET JUST BEFORE next visit in 3 months   If you have labs (blood work) drawn today and your tests are completely normal, you will receive your results only by: [name] MyChart Message (if you have MyChart) OR . A paper copy in the mail If you have any lab test that is abnormal or we need to change your treatment, we will call you to review the results.   Testing/Procedures: None today   Follow-Up: At [facility], you and your health needs are our priority.  As part of our continuing mission to provide you with exceptional heart care, we have created designated Provider Care Teams.  These Care Teams include your primary Cardiologist (physician) and Advanced Practice Providers (APPs -  Physician Assistants and Nurse Practitioners) who all work together to provide you with the care you need, when you need it.  We recommend signing up for the patient portal called "MyChart".  Sign up information is provided on this After Visit Summary.  MyChart is used to connect with patients for Virtual Visits (Telemedicine).  Patients are able to view lab/test results, encounter notes, upcoming appointments, etc.  Non-urgent messages can be sent to your provider as well.   To learn more about what you can do with MyChart, go to [email or web address].    Your next appointment:   3 month(s)  The format for your next appointment:   In Person  Provider:   [name], MD   Other Instructions None      Thank you for choosing [facility] !

## 2019-12-29 DIAGNOSIS — M25511 Pain in right shoulder: Secondary | ICD-10-CM | POA: Diagnosis not present

## 2020-01-04 ENCOUNTER — Other Ambulatory Visit: Payer: Self-pay | Admitting: "Endocrinology

## 2020-01-06 DIAGNOSIS — E1110 Type 2 diabetes mellitus with ketoacidosis without coma: Secondary | ICD-10-CM | POA: Diagnosis not present

## 2020-01-07 ENCOUNTER — Telehealth: Payer: Self-pay | Admitting: Pulmonary Disease

## 2020-01-07 MED ORDER — AZITHROMYCIN 250 MG PO TABS: ORAL_TABLET | ORAL | 0 refills | Status: DC

## 2020-01-07 MED ORDER — PREDNISONE 10 MG PO TABS: ORAL_TABLET | ORAL | 0 refills | Status: AC

## 2020-01-07 NOTE — Telephone Encounter (Signed)
Called and spoke with pt letting him know the recs per VS and stated that we were going to send meds to pharmacy for him. Pt verbalized understanding. verified preferred pharmacy and sent both meds to pharmacy for pt. Nothing further needed.

## 2020-01-07 NOTE — Telephone Encounter (Signed)
Can send script for prednisone 10 mg pill >> 3 pills daily for 2 days, 2 pills daily for 2 days, 1 pill daily for 2 days.  Can send script for Zpak.  He will need ROV if he is not improved after these therapies.

## 2020-01-07 NOTE — Telephone Encounter (Signed)
Called and spoke with pt who states he is having complaints of cough with occ yellow phlegm, chest congestion, wheezing, headache, and increased SOB which began about 1-2 weeks ago.  Pt is using his Breo inhaler every day. States he has been using the albuterol inhaler at least once daily to help with symptoms.  Pt denies any complaints of fever- temp today which was taken orally when he first woke up was 96.7  Pt has not tried any OTC meds  Pt states that he has not been around anyone that has had covid that he knows of. Pt has not received his covid booster vaccine yet. Pt has had his flu shot which he stated received about 2 months ago.  Pt wants recommendations to help with symptoms. Dr. Craige Cotta, please advise.

## 2020-01-08 ENCOUNTER — Other Ambulatory Visit: Payer: Self-pay

## 2020-01-08 ENCOUNTER — Telehealth: Payer: Self-pay

## 2020-01-08 DIAGNOSIS — E1159 Type 2 diabetes mellitus with other circulatory complications: Secondary | ICD-10-CM

## 2020-01-08 MED ORDER — BD PEN NEEDLE SHORT U/F 31G X 8 MM MISC: 5 refills | Status: DC

## 2020-01-08 NOTE — Telephone Encounter (Signed)
Sent in

## 2020-01-08 NOTE — Telephone Encounter (Signed)
Patient requesting refill on B-D ULTRAFINE III SHORT PEN 31G X 8 MM MISC. [facility] [address] [place]

## 2020-01-15 ENCOUNTER — Telehealth: Payer: Self-pay | Admitting: Family Medicine

## 2020-01-15 DIAGNOSIS — G2581 Restless legs syndrome: Secondary | ICD-10-CM

## 2020-01-15 MED ORDER — ROPINIROLE HCL 4 MG PO TABS: 2.0000 mg | ORAL_TABLET | Freq: Two times a day (BID) | ORAL | 1 refills | Status: DC

## 2020-01-15 NOTE — Telephone Encounter (Signed)
Relayed to pt the cpap report compliance from AL/NP and he stated he did get new mask.  He will continue working on this.  Was in the hospital for 2 days which he felt may have brought his numbers down.  I refilled his reqiup 2mg  po bid to [facility]. For 6 months.

## 2020-01-15 NOTE — Addendum Note (Signed)
Addended by: Brandon Melnick on: 01/15/2020 02:06 PM   Modules accepted: Orders

## 2020-01-15 NOTE — Telephone Encounter (Signed)
Please let him know that I have reviewed his compliance since last being seen. Daily compliance is great. 4 hour compliance is still a little low. His leak is a little better. Did he get new mask? AHI is better as well, previously 10 and now 7. Remind him to continue working on compliance and watching for leak. TY.

## 2020-02-14 ENCOUNTER — Other Ambulatory Visit: Payer: Self-pay | Admitting: Pulmonary Disease

## 2020-02-17 ENCOUNTER — Other Ambulatory Visit: Payer: Self-pay | Admitting: "Endocrinology

## 2020-02-18 ENCOUNTER — Emergency Department (HOSPITAL_COMMUNITY)
Admission: EM | Admit: 2020-02-18 | Discharge: 2020-02-18 | Disposition: A | Discharge status: Home / Self Care | Payer: Medicare HMO | Attending: Emergency Medicine | Admitting: Emergency Medicine

## 2020-02-18 ENCOUNTER — Emergency Department (HOSPITAL_COMMUNITY): Payer: Medicare HMO

## 2020-02-18 ENCOUNTER — Encounter: Payer: Self-pay | Admitting: Family Medicine

## 2020-02-18 ENCOUNTER — Other Ambulatory Visit: Payer: Self-pay

## 2020-02-18 ENCOUNTER — Encounter (HOSPITAL_COMMUNITY): Payer: Self-pay

## 2020-02-18 ENCOUNTER — Ambulatory Visit (INDEPENDENT_AMBULATORY_CARE_PROVIDER_SITE_OTHER): Payer: Medicare HMO | Admitting: Family Medicine

## 2020-02-18 VITALS — BP 128/80 | HR 82 | Temp 98.7°F | Ht 65.0 in | Wt 229.0 lb

## 2020-02-18 DIAGNOSIS — M79672 Pain in left foot: Secondary | ICD-10-CM

## 2020-02-18 DIAGNOSIS — M79605 Pain in left leg: Secondary | ICD-10-CM | POA: Diagnosis not present

## 2020-02-18 DIAGNOSIS — E1140 Type 2 diabetes mellitus with diabetic neuropathy, unspecified: Secondary | ICD-10-CM | POA: Insufficient documentation

## 2020-02-18 DIAGNOSIS — I2510 Atherosclerotic heart disease of native coronary artery without angina pectoris: Secondary | ICD-10-CM | POA: Diagnosis not present

## 2020-02-18 DIAGNOSIS — I110 Hypertensive heart disease with heart failure: Secondary | ICD-10-CM | POA: Insufficient documentation

## 2020-02-18 DIAGNOSIS — Z7984 Long term (current) use of oral hypoglycemic drugs: Secondary | ICD-10-CM | POA: Diagnosis not present

## 2020-02-18 DIAGNOSIS — M79671 Pain in right foot: Secondary | ICD-10-CM

## 2020-02-18 DIAGNOSIS — E039 Hypothyroidism, unspecified: Secondary | ICD-10-CM | POA: Insufficient documentation

## 2020-02-18 DIAGNOSIS — R0789 Other chest pain: Secondary | ICD-10-CM | POA: Diagnosis not present

## 2020-02-18 DIAGNOSIS — Z794 Long term (current) use of insulin: Secondary | ICD-10-CM | POA: Diagnosis not present

## 2020-02-18 DIAGNOSIS — R2243 Localized swelling, mass and lump, lower limb, bilateral: Secondary | ICD-10-CM | POA: Diagnosis not present

## 2020-02-18 DIAGNOSIS — I5030 Unspecified diastolic (congestive) heart failure: Secondary | ICD-10-CM | POA: Diagnosis not present

## 2020-02-18 DIAGNOSIS — R079 Chest pain, unspecified: Secondary | ICD-10-CM

## 2020-02-18 DIAGNOSIS — Z7982 Long term (current) use of aspirin: Secondary | ICD-10-CM | POA: Diagnosis not present

## 2020-02-18 DIAGNOSIS — Z7901 Long term (current) use of anticoagulants: Secondary | ICD-10-CM | POA: Insufficient documentation

## 2020-02-18 DIAGNOSIS — Z87891 Personal history of nicotine dependence: Secondary | ICD-10-CM | POA: Diagnosis not present

## 2020-02-18 DIAGNOSIS — J45909 Unspecified asthma, uncomplicated: Secondary | ICD-10-CM | POA: Insufficient documentation

## 2020-02-18 DIAGNOSIS — R600 Localized edema: Secondary | ICD-10-CM | POA: Diagnosis not present

## 2020-02-18 DIAGNOSIS — R072 Precordial pain: Secondary | ICD-10-CM | POA: Diagnosis not present

## 2020-02-18 DIAGNOSIS — M7989 Other specified soft tissue disorders: Secondary | ICD-10-CM

## 2020-02-18 DIAGNOSIS — Z86711 Personal history of pulmonary embolism: Secondary | ICD-10-CM | POA: Diagnosis not present

## 2020-02-18 DIAGNOSIS — Z86718 Personal history of other venous thrombosis and embolism: Secondary | ICD-10-CM | POA: Insufficient documentation

## 2020-02-18 DIAGNOSIS — Z96653 Presence of artificial knee joint, bilateral: Secondary | ICD-10-CM | POA: Diagnosis not present

## 2020-02-18 DIAGNOSIS — Z79899 Other long term (current) drug therapy: Secondary | ICD-10-CM | POA: Insufficient documentation

## 2020-02-18 DIAGNOSIS — R0602 Shortness of breath: Secondary | ICD-10-CM | POA: Diagnosis not present

## 2020-02-18 LAB — CBC
HCT: 39.1 % (ref 39.0–52.0)
Hemoglobin: 12.9 g/dL — ABNORMAL LOW (ref 13.0–17.0)
MCH: 30.1 pg (ref 26.0–34.0)
MCHC: 33 g/dL (ref 30.0–36.0)
MCV: 91.4 fL (ref 80.0–100.0)
Platelets: 223 10*3/uL (ref 150–400)
RBC: 4.28 MIL/uL (ref 4.22–5.81)
RDW: 13.9 % (ref 11.5–15.5)
WBC: 8.8 10*3/uL (ref 4.0–10.5)
nRBC: 0 % (ref 0.0–0.2)

## 2020-02-18 LAB — BASIC METABOLIC PANEL
Anion gap: 7 (ref 5–15)
BUN: 31 mg/dL — ABNORMAL HIGH (ref 8–23)
CO2: 23 mmol/L (ref 22–32)
Calcium: 8.9 mg/dL (ref 8.9–10.3)
Chloride: 106 mmol/L (ref 98–111)
Creatinine, Ser: 1.63 mg/dL — ABNORMAL HIGH (ref 0.61–1.24)
GFR, Estimated: 46 mL/min — ABNORMAL LOW (ref >60)
Glucose, Bld: 193 mg/dL — ABNORMAL HIGH (ref 70–99)
Potassium: 4 mmol/L (ref 3.5–5.1)
Sodium: 136 mmol/L (ref 135–145)

## 2020-02-18 LAB — TROPONIN I (HIGH SENSITIVITY)
Troponin I (High Sensitivity): 6 ng/L (ref <18)
Troponin I (High Sensitivity): 5 ng/L (ref <18)

## 2020-02-18 MED ORDER — SODIUM CHLORIDE 0.9 % IV BOLUS
500.0000 mL | Freq: Once | INTRAVENOUS | Status: AC
  Administered 2020-02-18: 500 mL via INTRAVENOUS

## 2020-02-18 MED ORDER — SODIUM CHLORIDE 0.9 % IV BOLUS: 250.0000 mL | Freq: Once | INTRAVENOUS | Status: DC

## 2020-02-18 NOTE — ED Provider Notes (Signed)
[facility] EMERGENCY DEPARTMENT Provider Note   CSN: [id number] Arrival date & time: 02/18/20  1631     History Chief Complaint  Patient presents with  . Leg Swelling  . Chest Pain    Paul Baker is a 70 y.o. male.  70 year old male with history of PE, DVT, paroxysmal a fib (on Eliquis), DM, with additional history listed below, sent to emergency room by PCP today.  Patient went to his PCP because of left calf pain and swelling onset this morning without injury, history of DVT.  Patient states that both of his legs are swollen up with the left calf as well as painful to him, swelling has improved since arrival in the emergency room.  Patient reports compliance with his Lasix.  While at his doctor's office, states his doctor listen to his heart and asked if he was having chest pain.  Patient states that he does have a history of angina and typically has pain just to the right of his sternum but today had pain just to the left of his sternum which is different for him.  States he is short of breath and diaphoretic at times.  Patient reports having fairly frequent chest pain.  Denies exertional pain today.  Patient is not having any pain in his chest at this time.        Past Medical History:  Diagnosis Date  . Arthritis   . Asthma   . Atrial fibrillation [facility])    Diagnosed December 2021  . Colon polyp   . Coronary atherosclerosis of native coronary artery    a. 2011: cath showing 90% stenosis along small non-dominant RCA (too small for PCI). b. 01/2018: cath showing nonobstructive CAD with 60 to 70% proximal to mid nondominant RCA stenosis, 50% mid LAD and 40 to 50% OM1  . DVT (deep venous thrombosis) ([place]) 2005   Right arm  . Essential hypertension   . Headache   . History of transfusion   . Hypothyroidism   . Mixed hyperlipidemia   . Morbid obesity ([place])   . MRSA (methicillin resistant staph aureus) culture positive    08/2012  . Narcolepsy   . OSA (obstructive  sleep apnea)    CPAP  . Osteoarthritis   . Pneumonia   . Psoriasis   . Pulmonary embolism ([place]) 2004  . RLS (restless legs syndrome)   . Rotator cuff disorder    Left  . Septic arthritis of knee, left ([place])   . Skin cancer, basal cell   . Type 2 diabetes mellitus [facility])     Patient Active Problem List   Diagnosis Date Noted  . Acute coronary syndrome without high troponin ([place]) 12/05/2019  . Grade I diastolic dysfunction 42/68/3419  . Type 2 diabetes mellitus ([place])   . Paroxysmal atrial fibrillation (H[facility])   . Hypothyroidism   . Acute bacterial rhinosinusitis 11/18/2019  . Cough 11/18/2019  . Paresthesia of skin 08/05/2019  . Asthma 08/05/2019  . Class 2 obesity 08/05/2019  . Right sided weakness 08/05/2019  . Vertigo 08/04/2019  . Dizziness 02/18/2019  . Type 2 diabetes mellitus with hypoglycemia without coma ([place]) 03/31/2017  . AKI (acute kidney injury) ([place]) 03/30/2017  . Right patellar tendon rupture 12/04/2014  . Mixed hyperlipidemia 11/25/2014  . Other specified hypothyroidism 11/25/2014  . S/P right TKA 03/23/2014  . S/P knee replacement 03/23/2014  . Spinal stenosis of cervicothoracic region 10/08/2013  . RLS (restless legs syndrome) 10/08/2013  . DM type  2 causing vascular disease ([place]) 10/08/2013  . Preoperative cardiovascular examination 08/14/2013  . Sleep apnea with use of continuous positive airway pressure (CPAP) 04/02/2013  . 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/02/2013  . Diabetic neuropathy ([place]) 07/23/2012  . Obesity, morbid ([place]) 05/01/2012  . Hypersomnia, persistent 05/01/2012  . OSA on CPAP 04/19/2012  . Chest pain 04/19/2012  . Essential hypertension, benign 12/16/2009  . CORONARY ATHEROSCLEROSIS NATIVE CORONARY ARTERY 12/16/2009  . Hyperglycemia due to type 2 diabetes mellitus ([place]) 11/30/2009  . Accelerating angina ([place]) 11/30/2009    Past Surgical History:  Procedure Laterality Date  . BACK SURGERY    . CATARACT EXTRACTION  Bilateral   . COLONOSCOPY    . CYST REMOVAL TRUNK     from back  . EYE SURGERY Left 2016   laser to left eye  . HIP SURGERY     bone removed from both sides of hip  . KNEE ARTHROTOMY Right 12/04/2014   Procedure: KNEE ARTHROTOMY PATELLA LIGAMENT RECONSTRUSION AND REPAIR RIGHT KNEE;  Surgeon: Paralee Cancel, MD;  Location: [place];  Service: Orthopedics;  Laterality: Right;  . KNEE SURGERY     X 25 TIMES  . LEFT HEART CATH AND CORONARY ANGIOGRAPHY N/A 01/17/2018   Procedure: LEFT HEART CATH AND CORONARY ANGIOGRAPHY;  Surgeon: Belva Crome, MD;  Location: [place] [facility];  Service: Cardiovascular;  Laterality: N/A;  . LUMBAR DISC SURGERY     Left L3, L4, L5 discecotomy with decompression of L4 root  . TONSILLECTOMY    . TOTAL KNEE ARTHROPLASTY  2003   LEFT  . TOTAL KNEE ARTHROPLASTY Right 03/23/2014   Procedure: RIGHT TOTAL KNEE ARTHROPLASTY AND REMOVAL RIGHT TIBIAL  DEEP IMPLANT STAPLE;  Surgeon: Paralee Cancel, MD;  Location: [facility];  Service: Orthopedics;  Laterality: Right;  . TOTAL KNEE REVISION  2005   LEFT  . WRIST SURGERY         Family History  Problem Relation Age of Onset  . Hypertension Father   . Heart attack Father   . Kidney Stones Father   . Seizures Grandchild   . Narcolepsy Grandchild   . Diabetes Sister     Social History   Tobacco Use  . Smoking status: Former Smoker    Packs/day: 1.50    Years: 10.00    Pack years: 15.00    Types: Cigarettes    Start date: 06/19/1967    Quit date: 01/03/1995    Years since quitting: 25.1  . Smokeless tobacco: Never Used  Vaping Use  . Vaping Use: Never used  Substance Use Topics  . Alcohol use: No    Alcohol/week: 0.0 standard drinks    Comment: quit drinking in 07/86  . Drug use: No    Home Medications Prior to Admission medications   Medication Sig Start Date End Date Taking? Authorizing Provider  acetaminophen (TYLENOL) 325 MG tablet Take 2 tablets (650 mg total) by mouth every 6 (six) hours as needed for  mild pain (or Fever >/= 101). 12/05/19   Roxan Hockey, MD  albuterol (VENTOLIN HFA) 108 (90 Base) MCG/ACT inhaler Inhale 2 puffs into the lungs every 6 (six) hours as needed for wheezing or shortness of breath. 12/12/18   [name], MD  amLODipine (NORVASC) 5 MG tablet Take 1 tablet (5 mg total) by mouth daily. 12/05/19   Roxan Hockey, MD  apixaban (ELIQUIS) 5 MG TABS tablet Take 1 tablet (5 mg total) by mouth 2 (two) times daily.  For stroke prevention 12/05/19   Roxan Hockey, MD  aspirin EC 81 MG tablet Take 1 tablet (81 mg total) by mouth daily with breakfast. Swallow whole. 12/05/19 12/04/20  Roxan Hockey, MD  BREO ELLIPTA 200-25 MCG/INH AEPB Inhale 1 puff by mouth once daily 02/16/20   Chesley Mires, MD  Continuous Blood Gluc Receiver (DEXCOM G6 RECEIVER) DEVI 1 Piece by Does not apply route as needed. 04/13/18   Cassandria Anger, MD  Continuous Blood Gluc Sensor (DEXCOM G6 SENSOR) MISC 4 Pieces by Does not apply route once a week. 05/15/18   Cassandria Anger, MD  EUTHYROX 137 MCG tablet TAKE 1 TABLET BY MOUTH ONCE DAILY BEFORE BREAKFAST 02/18/20   Brita Romp, NP  furosemide (LASIX) 40 MG tablet Take 40 mg by mouth. Take 40 mg alternating with 80 mg Daily    [provider]  Insulin Pen Needle (B-D ULTRAFINE III SHORT PEN) 31G X 8 MM MISC USING THREE  TIMES DAILY. 01/08/20   Brita Romp, NP  insulin regular human CONCENTRATED (HUMULIN R U-500 KWIKPEN) 500 UNIT/ML kwikpen Inject 75-85 Units into the skin 3 (three) times daily with meals. Inject 85 units with breakfast and lunch, and 75 units with supper if glucose is above 90 and you are eating 12/11/19   Brita Romp, NP  isosorbide mononitrate (IMDUR) 60 MG 24 hr tablet TAKE 1 TABLET BY MOUTH IN THE MORNING AND 1/2 (ONE-HALF) IN THE EVENING Patient taking differently: Take 30-60 mg by mouth in the morning and at bedtime. 1 tablet in am, half a tablet in the evening 06/30/19   Satira Sark, MD   loratadine (CLARITIN) 10 MG tablet Take 10 mg by mouth daily as needed for allergies.     [provider]  losartan (COZAAR) 50 MG tablet Take 1 tablet (50 mg total) by mouth daily. 07/21/19   Lovena Le, Malena M, DO  metFORMIN (GLUCOPHAGE) 500 MG tablet TAKE 1 TABLET BY MOUTH TWICE DAILY WITH MEALS 01/05/20   Nida, Marella Chimes, MD  metoprolol succinate (TOPROL XL) 25 MG 24 hr tablet Take 1 tablet (25 mg total) by mouth See admin instructions. -Take 1 tablet (25 mg) along with an additional 50 mg tablet for total of 75 mg twice daily 12/05/19 12/04/20  Roxan Hockey, MD  metoprolol succinate (TOPROL-XL) 50 MG 24 hr tablet Take 1 tablet (50 mg total) by mouth 2 (two) times daily. -Take 1 tablet (50 mg) along with an additional 25 mg tablet for total of 75 mg twice daily 12/05/19   Emokpae, Courage, MD  modafinil (PROVIGIL) 200 MG tablet TAKE ONE TABLET BY MOUTH ONE TIME DAILY 10/13/19   Lomax, Amy, NP  nitroGLYCERIN (NITROSTAT) 0.4 MG SL tablet DISSOLVE 1 TABLET UNDER TONGUE EVERY 5 MINUTES UP TO [place]. IF NO RELIEF CALL 911. Patient taking differently: Place 0.4 mg under the tongue every 5 (five) minutes as needed for chest pain. 11/08/17   Satira Sark, MD  ONE TOUCH ULTRA TEST test strip TEST BLOOD SUGAR UP TO 4 TIMES DAILY. 10/10/16   Cassandria Anger, MD  ONETOUCH DELICA LANCETS 54S MISC USE AS DIRECTED TO TEST BLOOD SUGAR 4 TIMES DAILY. 10/10/16   Cassandria Anger, MD  pravastatin (PRAVACHOL) 40 MG tablet Take 1 tablet (40 mg total) by mouth every evening. 10/20/19   Lovena Le, Malena M, DO  rOPINIRole (REQUIP XL) 4 MG 24 hr tablet Take 1 tablet (4 mg total) by mouth at bedtime.  12/10/19   Lomax, Amy, NP  rOPINIRole (REQUIP) 4 MG tablet Take 0.5 tablets (2 mg total) by mouth 2 (two) times daily. 01/15/20   Lomax, Amy, NP  Vitamin D, Ergocalciferol, (DRISDOL) 1.25 MG (50000 UNIT) CAPS capsule Take 1 capsule by mouth once a week 11/11/19   Cassandria Anger, MD     Allergies    Tape, Cardizem [diltiazem hcl], Cardura [doxazosin mesylate], Lipitor [atorvastatin], Paxil [paroxetine hcl], Codeine, and Gabapentin  Review of Systems   Review of Systems  Constitutional: Negative for fever.  Respiratory: Positive for shortness of breath.   Cardiovascular: Positive for chest pain and leg swelling. Negative for palpitations.  Gastrointestinal: Negative for abdominal pain, nausea and vomiting.  Genitourinary: Negative for dysuria and frequency.  Musculoskeletal: Positive for myalgias.  Skin: Negative for rash and wound.  Allergic/Immunologic: Positive for immunocompromised state.  Neurological: Negative for weakness.  Hematological: Bruises/bleeds easily.  All other systems reviewed and are negative.   Physical Exam Updated Vital Signs BP (!) 153/77   Pulse 80   Temp 98.2 F (36.8 C) (Oral)   Resp 18   Ht 5\' 5"  (1.651 m)   Wt 103 kg   SpO2 98%   BMI 37.77 kg/m   Physical Exam Vitals and nursing note reviewed.  Constitutional:      General: He is not in acute distress.    Appearance: He is well-developed and well-nourished. He is not diaphoretic.  HENT:     Head: Normocephalic and atraumatic.  Cardiovascular:     Rate and Rhythm: Normal rate and regular rhythm.     Heart sounds: Normal heart sounds.  Pulmonary:     Effort: Pulmonary effort is normal.     Breath sounds: Normal breath sounds.  Chest:     Chest wall: No tenderness.  Abdominal:     Palpations: Abdomen is soft.     Tenderness: There is no abdominal tenderness.  Musculoskeletal:     Comments: Reports mild left calf tenderness, no palpable cords, no erythema.  No significant swelling to the legs, question slightly increased swelling to right compared to left although negligible.  Skin:    General: Skin is warm and dry.     Findings: No erythema or rash.  Neurological:     Mental Status: He is alert and oriented to person, place, and time.  Psychiatric:        Mood  and Affect: Mood and affect normal.        Behavior: Behavior normal.     ED Results / Procedures / Treatments   Labs (all labs ordered are listed, but only abnormal results are displayed) Labs Reviewed  BASIC METABOLIC PANEL - Abnormal; Notable for the following components:      Result Value   Glucose, Bld 193 (*)    BUN 31 (*)    Creatinine, Ser 1.63 (*)    GFR, Estimated 46 (*)    All other components within normal limits  CBC - Abnormal; Notable for the following components:   Hemoglobin 12.9 (*)    All other components within normal limits  TROPONIN I (HIGH SENSITIVITY)  TROPONIN I (HIGH SENSITIVITY)    EKG EKG Interpretation  Date/Time:  Wednesday February 18 2020 16:44:52 EST Ventricular Rate:  77 PR Interval:  162 QRS Duration: 86 QT Interval:  384 QTC Calculation: 434 R Axis:   -17 Text Interpretation: Normal sinus rhythm No significant change since last tracing Confirmed by Lajean Saver [phone]) on 02/18/2020 5:03:11 PM  Radiology DG Chest 2 View  Result Date: 02/18/2020 CLINICAL DATA:  Chest pain EXAM: CHEST - 2 VIEW COMPARISON:  12/05/2019 FINDINGS: No focal consolidation or effusion. Stable cardiomediastinal silhouette with aortic atherosclerosis. No pneumothorax. IMPRESSION: No active cardiopulmonary disease. Electronically Signed   By: Donavan Foil M.D.   On: 02/18/2020 17:58    Procedures Procedures   Medications Ordered in ED Medications  sodium chloride 0.9 % bolus 500 mL (500 mLs Intravenous New Bag/Given 02/18/20 1834)    ED Course  I have reviewed the triage vital signs and the nursing notes.  Pertinent labs & imaging results that were available during my care of the patient were reviewed by me and considered in my medical decision making (see chart for details).  Clinical Course as of 02/18/20 1857  Wed Feb 17, 4221  2454 70 year old male with history of PE and DVT on Eliquis presents for evaluation of leg pain and swelling with chest  pain. No significant unilateral swelling on exam, skin intact, pulses present. Doppler study pending. EKG without acute ischemic changes, CBC unremarkable, BMP with slight increase in creatinine 1.63, given small fluid bolus.  Initial troponin is 6, pending delta troponin. Care signed out pending Doppler report and repeat troponin. [LM]    Clinical Course User Index [LM] Roque Lias   MDM Rules/Calculators/A&P                          Final Clinical Impression(s) / ED Diagnoses Final diagnoses:  Atypical chest pain  Leg swelling    Rx / DC Orders ED Discharge Orders    None       Tacy Learn, PA-C 02/18/20 1858    Lajean Saver, MD 02/19/20 [phone]

## 2020-02-18 NOTE — Discharge Instructions (Addendum)
Seen for chest pain and leg swelling.  Lab work and imaging all look reassuring.  You were bit dehydrated seen on lab work. I recommend continuing to stay hydrated.  Please drink plenty of fluids.  Continue with all medications as prescribed.  May follow-up with your primary care provider as needed.  Come back to the emergency department if you develop chest pain, shortness of breath, severe abdominal pain, uncontrolled nausea, vomiting, diarrhea.

## 2020-02-18 NOTE — ED Provider Notes (Signed)
Patient was received at shift change from Suella Broad, PA-C, she provided HPI, current work-up, likely disposition.  Please see her note for full detail.  Patient with significant medical history of PE, DVT, proximal A. fib currently on Eliquis diabetes presents with chief complaint of chest pain and bilateral leg swelling.  He was sent from his primary care office here for further evaluation.  Patient was worked up for chest pain and a DVT.  Per previous provider follow-up on second troponin and DVT study, if both negative patient can be discharged home. Physical Exam  BP 135/76   Pulse 77   Temp 98.2 F (36.8 C) (Oral)   Resp 19   Ht 5\' 5"  (1.651 m)   Wt 103 kg   SpO2 96%   BMI 37.77 kg/m   Physical Exam Vitals and nursing note reviewed.  Constitutional:      General: He is not in acute distress.    Appearance: He is not ill-appearing.  HENT:     Head: Normocephalic and atraumatic.     Nose: No congestion.  Eyes:     Conjunctiva/sclera: Conjunctivae normal.  Cardiovascular:     Rate and Rhythm: Normal rate.  Pulmonary:     Effort: Pulmonary effort is normal.  Musculoskeletal:     Right lower leg: No edema.     Left lower leg: No edema.     Comments: Patient is moving all 4 extremities out difficulty.  Skin:    General: Skin is warm and dry.  Neurological:     Mental Status: He is alert.  Psychiatric:        Mood and Affect: Mood normal.     ED Course/Procedures   Clinical Course as of 02/18/20 2026  Wed Feb 18, 3543  [id number] 70 year old male with history of PE and DVT on Eliquis presents for evaluation of leg pain and swelling with chest pain. No significant unilateral swelling on exam, skin intact, pulses present. Doppler study pending. EKG without acute ischemic changes, CBC unremarkable, BMP with slight increase in creatinine 1.63, given small fluid bolus.  Initial troponin is 6, pending delta troponin. Care signed out pending Doppler report and repeat troponin.  [LM]    Clinical Course User Index [LM] Tacy Learn, PA-C    Procedures  MDM  Troponin decreased from 6 down to 5.  DVT study is negative.  All other lab work unremarkable at this time.    Vital signs have remained stable, no indication for hospital admission.  Patient discussed with attending and they agreed with assessment and plan.  Patient given at home care as well strict return precautions.  Patient verbalized that they understood agreed to said plan.       Marcello Fennel, PA-C 02/18/20 2035    Lajean Saver, MD 02/19/20 [phone]

## 2020-02-18 NOTE — Progress Notes (Signed)
Patient ID: Paul Baker, male    DOB: Jun 16, 1950, 70 y.o.   MRN: [id number]   Chief Complaint  Patient presents with  . Leg Pain   Subjective:    HPIpain and swelling in both legs. Left one started today and right foot has been off and on for awhile.   Couple weeks the right foot hurting across top.  Mild swelling on RLE.  Swelling in left lower leg is worse.  Has been elevating today and helped some. On rt leg goes down with swelling when he elevates it at night. Pt is on lasix for swelling, taking 40mg  and 80mg  alternating.  No long car/ plane recently. H/o cancer and basal cell on face near left eye. No chemo. No recent HRT.  Not on any now. Has had lower leg dvt in past.  On eliquis and asa daily. Used to be on coumadin.  Had dvt on RLE and PE bilateral lungs in 1982. Had left knee infection after knee surgery on left and prosthetic got infected and was on thinner and rifampin.  Taking medications daily, pt denies missing any doses of blood thinners.  Stroke on left side 2x.  Dx with TIA in 8/21.  Has PAF they think had another stroke in 12/21. Woke up with chest pain and neck pains and went to ER.  Dx with ACS and elevated troponins in 12/21. Pt on eliquis and asa.  Was on plavix, not anymore. Had stroke in 8/21 and they called it "TIA" pt called it a stroke. Rt hand weakness has resolved and rehabed.  On exam pt stating having left sided chest pain. And doesn't feel the same as the time in 12/21. No sweating, nausea or neck pain.   Medical History Kash has a past medical history of Arthritis, Asthma, Atrial fibrillation ([place]), Colon polyp, Coronary atherosclerosis of native coronary artery, DVT (deep venous thrombosis) ([place]) (2005), Essential hypertension, Headache, History of transfusion, Hypothyroidism, Mixed hyperlipidemia, Morbid obesity ([place]), MRSA (methicillin resistant staph aureus) culture positive, Narcolepsy, OSA (obstructive sleep apnea),  Osteoarthritis, Pneumonia, Psoriasis, Pulmonary embolism ([place]) (2004), RLS (restless legs syndrome), Rotator cuff disorder, Septic arthritis of knee, left ([place]), Skin cancer, basal cell, and Type 2 diabetes mellitus ([place]).   Outpatient Encounter Medications as of 02/18/2020  Medication Sig  . acetaminophen (TYLENOL) 325 MG tablet Take 2 tablets (650 mg total) by mouth every 6 (six) hours as needed for mild pain (or Fever >/= 101).  [name] albuterol (VENTOLIN HFA) 108 (90 Base) MCG/ACT inhaler Inhale 2 puffs into the lungs every 6 (six) hours as needed for wheezing or shortness of breath.  [name] amLODipine (NORVASC) 5 MG tablet Take 1 tablet (5 mg total) by mouth daily.  [name] apixaban (ELIQUIS) 5 MG TABS tablet Take 1 tablet (5 mg total) by mouth 2 (two) times daily. For stroke prevention  . aspirin EC 81 MG tablet Take 1 tablet (81 mg total) by mouth daily with breakfast. Swallow whole.  [name] BREO ELLIPTA 200-25 MCG/INH AEPB Inhale 1 puff by mouth once daily  . Continuous Blood Gluc Receiver (DEXCOM G6 RECEIVER) DEVI 1 Piece by Does not apply route as needed.  . Continuous Blood Gluc Sensor (DEXCOM G6 SENSOR) MISC 4 Pieces by Does not apply route once a week.  Arna Medici 137 MCG tablet TAKE 1 TABLET BY MOUTH ONCE DAILY BEFORE BREAKFAST  . Insulin Pen Needle (B-D ULTRAFINE III SHORT PEN) 31G X 8 MM MISC USING THREE  TIMES DAILY.  [name] insulin  regular human CONCENTRATED (HUMULIN R U-500 KWIKPEN) 500 UNIT/ML kwikpen Inject 75-85 Units into the skin 3 (three) times daily with meals. Inject 85 units with breakfast and lunch, and 75 units with supper if glucose is above 90 and you are eating  . isosorbide mononitrate (IMDUR) 60 MG 24 hr tablet TAKE 1 TABLET BY MOUTH IN THE MORNING AND 1/2 (ONE-HALF) IN THE EVENING (Patient taking differently: Take 30-60 mg by mouth in the morning and at bedtime. 1 tablet in am, half a tablet in the evening)  . loratadine (CLARITIN) 10 MG tablet Take 10 mg by mouth daily as needed for  allergies.   [name] losartan (COZAAR) 50 MG tablet Take 1 tablet (50 mg total) by mouth daily.  . metFORMIN (GLUCOPHAGE) 500 MG tablet TAKE 1 TABLET BY MOUTH TWICE DAILY WITH MEALS  . metoprolol succinate (TOPROL XL) 25 MG 24 hr tablet Take 1 tablet (25 mg total) by mouth See admin instructions. -Take 1 tablet (25 mg) along with an additional 50 mg tablet for total of 75 mg twice daily  . metoprolol succinate (TOPROL-XL) 50 MG 24 hr tablet Take 1 tablet (50 mg total) by mouth 2 (two) times daily. -Take 1 tablet (50 mg) along with an additional 25 mg tablet for total of 75 mg twice daily  . modafinil (PROVIGIL) 200 MG tablet TAKE ONE TABLET BY MOUTH ONE TIME DAILY  . nitroGLYCERIN (NITROSTAT) 0.4 MG SL tablet DISSOLVE 1 TABLET UNDER TONGUE EVERY 5 MINUTES UP TO 15 MIN FOR CHESTPAIN. IF NO RELIEF CALL 911. (Patient taking differently: Place 0.4 mg under the tongue every 5 (five) minutes as needed for chest pain.)  . ONE TOUCH ULTRA TEST test strip TEST BLOOD SUGAR UP TO 4 TIMES DAILY.  [name] ONETOUCH DELICA LANCETS 81X MISC USE AS DIRECTED TO TEST BLOOD SUGAR 4 TIMES DAILY.  . pravastatin (PRAVACHOL) 40 MG tablet Take 1 tablet (40 mg total) by mouth every evening.  [name] rOPINIRole (REQUIP XL) 4 MG 24 hr tablet Take 1 tablet (4 mg total) by mouth at bedtime.  [name] rOPINIRole (REQUIP) 4 MG tablet Take 0.5 tablets (2 mg total) by mouth 2 (two) times daily.  . Vitamin D, Ergocalciferol, (DRISDOL) 1.25 MG (50000 UNIT) CAPS capsule Take 1 capsule by mouth once a week  . [DISCONTINUED] azithromycin (ZITHROMAX) 250 MG tablet Take two today and then one daily until finished.  . furosemide (LASIX) 40 MG tablet Take 40 mg by mouth. Take 40 mg alternating with 80 mg Daily   No facility-administered encounter medications on file as of 02/18/2020.     Review of Systems  Constitutional: Negative for chills and fever.  HENT: Negative for congestion, rhinorrhea and sore throat.   Respiratory: Negative for cough, shortness of  breath and wheezing.   Cardiovascular: Positive for chest pain and leg swelling.  Gastrointestinal: Negative for abdominal pain, diarrhea, nausea and vomiting.  Genitourinary: Negative for dysuria and frequency.  Skin: Negative for rash.  Neurological: Negative for dizziness, weakness and headaches.     Vitals BP 128/80   Pulse 82   Temp 98.7 F (37.1 C)   Ht 5\' 5"  (1.651 m)   Wt 229 lb (103.9 kg)   SpO2 96%   BMI 38.11 kg/m   Objective:   Physical Exam Vitals and nursing note reviewed.  Constitutional:      General: He is not in acute distress.    Appearance: Normal appearance. He is not ill-appearing.  Cardiovascular:     Rate  and Rhythm: Normal rate and regular rhythm.     Pulses: Normal pulses.     Heart sounds: Normal heart sounds. No murmur heard.   Pulmonary:     Effort: Pulmonary effort is normal. No respiratory distress.     Breath sounds: Normal breath sounds. No wheezing, rhonchi or rales.  Musculoskeletal:        General: Swelling and tenderness (left calf) present. Normal range of motion.     Right lower leg: Edema present.     Left lower leg: Edema present.     Comments: +1 pitting edema on rt lower leg.  +2 pitting edema on left lower leg.  +calf pain on left lower leg. No erythema or warmth. No sores/ulcers. Normal pedal pulses bilaterally  Skin:    General: Skin is warm and dry.     Findings: No rash.  Neurological:     General: No focal deficit present.     Mental Status: He is alert and oriented to person, place, and time.  Psychiatric:        Mood and Affect: Mood normal.        Behavior: Behavior normal.        Thought Content: Thought content normal.        Judgment: Judgment normal.      Assessment and Plan   1. Left leg swelling - US Venous Img Lower Unilateral Left (DVT)  2. Left leg pain - US Venous Img Lower Unilateral Left (DVT)  3. Chest pain, unspecified type  4. Bilateral foot pain  5. History of pulmonary  embolism   Pt seen for left lower leg pain/swelling.  However, while in office pt stating having left sided chest pain on exam. I told pt that he needed to go to ER to get further evaluation with his history of recent unstable angina and Afib and now with left lower leg pain/swelling.  Needing labs and imaging. Pt in agreement. Pt declined EMS. Stating wife in parking lot and will drive over to [facility] ER.  F/u after ER visit.

## 2020-02-18 NOTE — ED Triage Notes (Signed)
Pt to er, pt states that he was at his doctors office and he told his md that he was having chest pain and she sent him to the er to get evaluated.  States that his pmd also wanted him to get his leg checked for a blood clot.  Pt states that he has had the chest pain off and on for the past 2-3 days.

## 2020-02-19 ENCOUNTER — Other Ambulatory Visit: Payer: Self-pay | Admitting: "Endocrinology

## 2020-02-20 ENCOUNTER — Ambulatory Visit: Payer: Medicare HMO | Admitting: Family Medicine

## 2020-03-10 DIAGNOSIS — E1159 Type 2 diabetes mellitus with other circulatory complications: Secondary | ICD-10-CM | POA: Diagnosis not present

## 2020-03-10 DIAGNOSIS — E038 Other specified hypothyroidism: Secondary | ICD-10-CM | POA: Diagnosis not present

## 2020-03-11 ENCOUNTER — Ambulatory Visit: Payer: Medicare HMO | Admitting: Nurse Practitioner

## 2020-03-11 LAB — COMPREHENSIVE METABOLIC PANEL
ALT: 14 IU/L (ref 0–44)
AST: 16 IU/L (ref 0–40)
Albumin/Globulin Ratio: 1.9 (ref 1.2–2.2)
Albumin: 4.3 g/dL (ref 3.8–4.8)
Alkaline Phosphatase: 75 IU/L (ref 44–121)
BUN/Creatinine Ratio: 20 (ref 10–24)
BUN: 22 mg/dL (ref 8–27)
Bilirubin Total: 0.3 mg/dL (ref 0.0–1.2)
CO2: 20 mmol/L (ref 20–29)
Calcium: 9.4 mg/dL (ref 8.6–10.2)
Chloride: 101 mmol/L (ref 96–106)
Creatinine, Ser: 1.08 mg/dL (ref 0.76–1.27)
Globulin, Total: 2.3 g/dL (ref 1.5–4.5)
Glucose: 266 mg/dL — ABNORMAL HIGH (ref 65–99)
Potassium: 4.6 mmol/L (ref 3.5–5.2)
Sodium: 138 mmol/L (ref 134–144)
Total Protein: 6.6 g/dL (ref 6.0–8.5)
eGFR: 75 mL/min/{1.73_m2} (ref >59)

## 2020-03-11 LAB — TSH: TSH: 1.31 u[IU]/mL (ref 0.450–4.500)

## 2020-03-11 LAB — T4, FREE: Free T4: 1.57 ng/dL (ref 0.82–1.77)

## 2020-03-18 ENCOUNTER — Other Ambulatory Visit: Payer: Self-pay | Admitting: "Endocrinology

## 2020-03-18 ENCOUNTER — Encounter: Payer: Self-pay | Admitting: Nurse Practitioner

## 2020-03-18 ENCOUNTER — Ambulatory Visit (INDEPENDENT_AMBULATORY_CARE_PROVIDER_SITE_OTHER): Payer: Medicare HMO | Admitting: Nurse Practitioner

## 2020-03-18 ENCOUNTER — Other Ambulatory Visit: Payer: Self-pay

## 2020-03-18 VITALS — BP 142/75 | HR 71 | Ht 65.0 in | Wt 227.4 lb

## 2020-03-18 DIAGNOSIS — E782 Mixed hyperlipidemia: Secondary | ICD-10-CM

## 2020-03-18 DIAGNOSIS — E1159 Type 2 diabetes mellitus with other circulatory complications: Secondary | ICD-10-CM

## 2020-03-18 DIAGNOSIS — E038 Other specified hypothyroidism: Secondary | ICD-10-CM

## 2020-03-18 DIAGNOSIS — I10 Essential (primary) hypertension: Secondary | ICD-10-CM

## 2020-03-18 DIAGNOSIS — G4733 Obstructive sleep apnea (adult) (pediatric): Secondary | ICD-10-CM | POA: Diagnosis not present

## 2020-03-18 LAB — POCT GLYCOSYLATED HEMOGLOBIN (HGB A1C): HbA1c, POC (controlled diabetic range): 9.7 % — AB (ref 0.0–7.0)

## 2020-03-18 MED ORDER — HUMULIN R U-500 KWIKPEN 500 UNIT/ML ~~LOC~~ SOPN: 75.0000 [IU] | PEN_INJECTOR | Freq: Three times a day (TID) | SUBCUTANEOUS | 2 refills | Status: DC

## 2020-03-18 NOTE — Patient Instructions (Signed)

## 2020-03-18 NOTE — Progress Notes (Signed)
03/18/2020   Endocrinology follow-up note   Subjective:    Patient ID: Paul Baker, male    DOB: 11-23-1950, PCP Erven Colla, DO   Past Medical History:  Diagnosis Date  . Arthritis   . Asthma   . Atrial fibrillation [facility])    Diagnosed December 2021  . Colon polyp   . Coronary atherosclerosis of native coronary artery    a. 2011: cath showing 90% stenosis along small non-dominant RCA (too small for PCI). b. 01/2018: cath showing nonobstructive CAD with 60 to 70% proximal to mid nondominant RCA stenosis, 50% mid LAD and 40 to 50% OM1  . DVT (deep venous thrombosis) ([place]) 2005   Right arm  . Essential hypertension   . Headache   . History of transfusion   . Hypothyroidism   . Mixed hyperlipidemia   . Morbid obesity ([place])   . MRSA (methicillin resistant staph aureus) culture positive    08/2012  . Narcolepsy   . OSA (obstructive sleep apnea)    CPAP  . Osteoarthritis   . Pneumonia   . Psoriasis   . Pulmonary embolism ([place]) 2004  . RLS (restless legs syndrome)   . Rotator cuff disorder    Left  . Septic arthritis of knee, left ([place])   . Skin cancer, basal cell   . Type 2 diabetes mellitus ([place])    Past Surgical History:  Procedure Laterality Date  . BACK SURGERY    . CATARACT EXTRACTION Bilateral   . COLONOSCOPY    . CYST REMOVAL TRUNK     from back  . EYE SURGERY Left 2016   laser to left eye  . HIP SURGERY     bone removed from both sides of hip  . KNEE ARTHROTOMY Right 12/04/2014   Procedure: KNEE ARTHROTOMY PATELLA LIGAMENT RECONSTRUSION AND REPAIR RIGHT KNEE;  Surgeon: Paralee Cancel, MD;  Location: [place];  Service: Orthopedics;  Laterality: Right;  . KNEE SURGERY     X 25 TIMES  . LEFT HEART CATH AND CORONARY ANGIOGRAPHY N/A 01/17/2018   Procedure: LEFT HEART CATH AND CORONARY ANGIOGRAPHY;  Surgeon: Belva Crome, MD;  Location: [facility];  Service: Cardiovascular;  Laterality: N/A;  . LUMBAR DISC SURGERY     Left L3, L4, L5 discecotomy  with decompression of L4 root  . TONSILLECTOMY    . TOTAL KNEE ARTHROPLASTY  2003   LEFT  . TOTAL KNEE ARTHROPLASTY Right 03/23/2014   Procedure: RIGHT TOTAL KNEE ARTHROPLASTY AND REMOVAL RIGHT TIBIAL  DEEP IMPLANT STAPLE;  Surgeon: Paralee Cancel, MD;  Location: [facility];  Service: Orthopedics;  Laterality: Right;  . TOTAL KNEE REVISION  2005   LEFT  . WRIST SURGERY     Social History   Socioeconomic History  . Marital status: Married    Spouse name: Toney Reil  . Number of children: 2  . Years of education: college  . Highest education level: Not on file  Occupational History  . Occupation: Disabled    Employer: UNEMPLOYED  Tobacco Use  . Smoking status: Former Smoker    Packs/day: 1.50    Years: 10.00    Pack years: 15.00    Types: Cigarettes    Start date: 06/19/1967    Quit date: 01/03/1995    Years since quitting: 25.2  . Smokeless tobacco: Never Used  Vaping Use  . Vaping Use: Never used  Substance and Sexual Activity  . Alcohol use: No    Alcohol/week: 0.0 standard  drinks    Comment: quit drinking in 07/86  . Drug use: No  . Sexual activity: Yes    Partners: Female  Other Topics Concern  . Not on file  Social History Narrative    70 year old, right-handed, caucasian male with a past medical history of obesity, hypertension, hyperlipidemia, diabetes, obstructive sleep apnea, presenting with frequent nighttime awakenings, excessive daytime sleepiness, also transient confusional episodes.RLS and one beosity, OSA on CPAP with AHI of 3.2 and  setting of 16 cm water , [facility] .   Social Determinants of Health   Financial Resource Strain: Not on file  Food Insecurity: Not on file  Transportation Needs: Not on file  Physical Activity: Not on file  Stress: Not on file  Social Connections: Not on file   Outpatient Encounter Medications as of 03/18/2020  Medication Sig  . acetaminophen (TYLENOL) 325 MG tablet Take 2 tablets (650 mg total) by mouth every 6 (six) hours as  needed for mild pain (or Fever >/= 101).  [name] albuterol (VENTOLIN HFA) 108 (90 Base) MCG/ACT inhaler Inhale 2 puffs into the lungs every 6 (six) hours as needed for wheezing or shortness of breath.  [name] amLODipine (NORVASC) 5 MG tablet Take 1 tablet (5 mg total) by mouth daily.  [name] apixaban (ELIQUIS) 5 MG TABS tablet Take 1 tablet (5 mg total) by mouth 2 (two) times daily. For stroke prevention  . aspirin EC 81 MG tablet Take 1 tablet (81 mg total) by mouth daily with breakfast. Swallow whole.  [name] BREO ELLIPTA 200-25 MCG/INH AEPB Inhale 1 puff by mouth once daily  . Continuous Blood Gluc Receiver (DEXCOM G6 RECEIVER) DEVI 1 Piece by Does not apply route as needed.  . Continuous Blood Gluc Sensor (DEXCOM G6 SENSOR) MISC 4 Pieces by Does not apply route once a week.  Arna Medici 137 MCG tablet TAKE 1 TABLET BY MOUTH ONCE DAILY BEFORE BREAKFAST  . Insulin Pen Needle (B-D ULTRAFINE III SHORT PEN) 31G X 8 MM MISC USING THREE  TIMES DAILY.  . isosorbide mononitrate (IMDUR) 60 MG 24 hr tablet TAKE 1 TABLET BY MOUTH IN THE MORNING AND 1/2 (ONE-HALF) IN THE EVENING (Patient taking differently: Take 30-60 mg by mouth in the morning and at bedtime. 1 tablet in am, half a tablet in the evening)  . loratadine (CLARITIN) 10 MG tablet Take 10 mg by mouth daily as needed for allergies.   [name] losartan (COZAAR) 50 MG tablet Take 1 tablet (50 mg total) by mouth daily.  . metFORMIN (GLUCOPHAGE) 500 MG tablet TAKE 1 TABLET BY MOUTH TWICE DAILY WITH MEALS  . metoprolol succinate (TOPROL XL) 25 MG 24 hr tablet Take 1 tablet (25 mg total) by mouth See admin instructions. -Take 1 tablet (25 mg) along with an additional 50 mg tablet for total of 75 mg twice daily  . metoprolol succinate (TOPROL-XL) 50 MG 24 hr tablet Take 1 tablet (50 mg total) by mouth 2 (two) times daily. -Take 1 tablet (50 mg) along with an additional 25 mg tablet for total of 75 mg twice daily  . modafinil (PROVIGIL) 200 MG tablet TAKE ONE TABLET BY MOUTH ONE TIME  DAILY  . nitroGLYCERIN (NITROSTAT) 0.4 MG SL tablet DISSOLVE 1 TABLET UNDER TONGUE EVERY 5 MINUTES UP TO 15 MIN FOR CHESTPAIN. IF NO RELIEF CALL 911. (Patient taking differently: Place 0.4 mg under the tongue every 5 (five) minutes as needed for chest pain.)  . ONE TOUCH ULTRA TEST test strip TEST BLOOD SUGAR  UP TO 4 TIMES DAILY.  [name] ONETOUCH DELICA LANCETS 78G MISC USE AS DIRECTED TO TEST BLOOD SUGAR 4 TIMES DAILY.  . pravastatin (PRAVACHOL) 40 MG tablet Take 1 tablet (40 mg total) by mouth every evening.  [name] rOPINIRole (REQUIP XL) 4 MG 24 hr tablet Take 1 tablet (4 mg total) by mouth at bedtime.  [name] rOPINIRole (REQUIP) 4 MG tablet Take 0.5 tablets (2 mg total) by mouth 2 (two) times daily.  . Vitamin D, Ergocalciferol, (DRISDOL) 1.25 MG (50000 UNIT) CAPS capsule Take 1 capsule by mouth once a week  . [DISCONTINUED] insulin regular human CONCENTRATED (HUMULIN R U-500 KWIKPEN) 500 UNIT/ML kwikpen Inject 75-85 Units into the skin 3 (three) times daily with meals. Inject 85 units with breakfast and lunch, and 75 units with supper if glucose is above 90 and you are eating  . furosemide (LASIX) 40 MG tablet Take 40 mg by mouth. Take 40 mg alternating with 80 mg Daily  . insulin regular human CONCENTRATED (HUMULIN R U-500 KWIKPEN) 500 UNIT/ML kwikpen Inject 75-85 Units into the skin 3 (three) times daily with meals. Inject 85 units with breakfast and lunch, and 65 units with supper if glucose is above 90 and you are eating   No facility-administered encounter medications on file as of 03/18/2020.   ALLERGIES: Allergies  Allergen Reactions  . Tape Rash    Pulls off skin  . Cardizem [Diltiazem Hcl]     Edema   . Cardura [Doxazosin Mesylate]     Headaches / cramps  . Lipitor [Atorvastatin] Other (See Comments)    Leg cramps  . Paxil [Paroxetine Hcl]     Unknown reaction   . Codeine Rash and Other (See Comments)    Headache   . Gabapentin Rash   VACCINATION STATUS: Immunization History   Administered Date(s) Administered  . Influenza Split 09/18/2012  . Influenza,inj,Quad PF,6+ Mos 10/13/2013, 10/06/2014, 09/11/2018  . Influenza-Unspecified 12/01/2015, 11/08/2016  . PFIZER(Purple Top)SARS-COV-2 Vaccination 03/06/2019, 03/27/2019  . Pneumococcal Polysaccharide-23 10/02/2004, 10/03/2011  . Td 05/24/2006    Diabetes He presents for his follow-up diabetic visit. He has type 2 diabetes mellitus. Onset time: Was diagnosed at approximate age of 40 years. His disease course has been worsening. Hypoglycemia symptoms include sweats. Pertinent negatives for hypoglycemia include no confusion, headaches, nervousness/anxiousness, pallor, seizures or tremors. Associated symptoms include fatigue, foot paresthesias and polyuria. Pertinent negatives for diabetes include no chest pain, no polydipsia, no polyphagia, no weakness and no weight loss. Hypoglycemia complications include nocturnal hypoglycemia. Symptoms are stable. Diabetic complications include a CVA, heart disease, nephropathy and peripheral neuropathy. (Has had 2 mini strokes since last visit.  Been hospitalized for Afib as well) Risk factors for coronary artery disease include diabetes mellitus, dyslipidemia, male sex, obesity, sedentary lifestyle and tobacco exposure. Current diabetic treatment includes oral agent (dual therapy) and intensive insulin program. He is compliant with treatment most of the time. His weight is fluctuating minimally. He has had a previous visit with a dietitian. He never participates in exercise. His home blood glucose trend is fluctuating dramatically. His breakfast blood glucose range is generally 140-180 mg/dl. His lunch blood glucose range is generally >200 mg/dl. His dinner blood glucose range is generally >200 mg/dl. His bedtime blood glucose range is generally >200 mg/dl. His overall blood glucose range is >200 mg/dl. (He presents today with his CGM and logs showing continued tight fasting and above target  postprandial glycemic profile.  His POCT A1c today is 9.7%, increasing from previous visit of  8.7%.  He has been in the hospital since last visit for Afib and had trouble getting his medications as prescribed which affected his glucose levels.  He is continuing to have significant nocturnal hypoglycemia. ) An ACE inhibitor/angiotensin II receptor blocker is being taken. He sees a podiatrist.Eye exam is current.  Hyperlipidemia This is a chronic problem. The current episode started more than 1 year ago. The problem is uncontrolled. Recent lipid tests were reviewed and are high. Exacerbating diseases include chronic renal disease, diabetes, hypothyroidism and obesity. There are no known factors aggravating his hyperlipidemia. Pertinent negatives include no chest pain, myalgias or shortness of breath. Current antihyperlipidemic treatment includes statins. The current treatment provides mild improvement of lipids. Compliance problems include adherence to diet and adherence to exercise.  Risk factors for coronary artery disease include dyslipidemia, diabetes mellitus, hypertension, male sex, obesity and a sedentary lifestyle.  Hypertension This is a chronic problem. The current episode started more than 1 year ago. The problem has been gradually improving since onset. The problem is uncontrolled. Associated symptoms include sweats. Pertinent negatives include no chest pain, headaches, neck pain, palpitations or shortness of breath. Agents associated with hypertension include thyroid hormones. Risk factors for coronary artery disease include diabetes mellitus, dyslipidemia, male gender, obesity, sedentary lifestyle and smoking/tobacco exposure. Past treatments include angiotensin blockers, beta blockers and diuretics. Compliance problems include exercise and diet.  Hypertensive end-organ damage includes kidney disease, CAD/MI and CVA. Identifiable causes of hypertension include chronic renal disease and a thyroid  problem.  Thyroid Problem Presents for follow-up visit. Symptoms include fatigue and leg swelling. Patient reports no anxiety, cold intolerance, constipation, depressed mood, diarrhea, heat intolerance, palpitations, tremors, weight gain or weight loss. The symptoms have been stable. Past treatments include levothyroxine. His past medical history is significant for diabetes and hyperlipidemia.     Review of systems  Constitutional: + Minimally fluctuating body weight,  current Body mass index is 37.84 kg/m. , no fatigue, no subjective hyperthermia, no subjective hypothermia, reports mild memory impairment since mini strokes Eyes: no blurry vision, no xerophthalmia ENT: no sore throat, no nodules palpated in throat, no dysphagia/odynophagia, no hoarseness Cardiovascular: no chest pain, no shortness of breath, no palpitations, mild leg swelling Respiratory: no cough, no shortness of breath Gastrointestinal: no nausea/vomiting/diarrhea Musculoskeletal: no muscle/joint aches Skin: no rashes, no hyperemia Neurological: no tremors, no numbness, no tingling, no dizziness, + intermittent numbness/tingling to bilateral feet Psychiatric: no depression, no anxiety    Objective:    BP (!) 142/75 (BP Location: Right Arm, Patient Position: Sitting)   Pulse 71   Ht 5\' 5"  (1.651 m)   Wt 227 lb 6.4 oz (103.1 kg)   BMI 37.84 kg/m   Wt Readings from Last 3 Encounters:  03/18/20 227 lb 6.4 oz (103.1 kg)  02/18/20 227 lb (103 kg)  02/18/20 229 lb (103.9 kg)     BP Readings from Last 3 Encounters:  03/18/20 (!) 142/75  02/18/20 131/75  02/18/20 128/80     Physical Exam- Limited  Constitutional:  Body mass index is 37.84 kg/m. , not in acute distress, normal state of mind Eyes:  EOMI, no exophthalmos Neck: Supple Cardiovascular: Irregular (hx afib), no murmers, rubs, or gallops, 1+ pitting edema to BLE Respiratory: Adequate breathing efforts, no crackles, rales, rhonchi, or  wheezing Musculoskeletal: no gross deformities, strength intact in all four extremities, no gross restriction of joint movements Skin:  no rashes, no hyperemia Neurological: no tremor with outstretched hands     CMP  Latest Ref Rng & Units 03/10/2020 02/18/2020 12/05/2019  Glucose 65 - 99 mg/dL 266(H) 193(H) 112(H)  BUN 8 - 27 mg/dL 22 31(H) 28(H)  Creatinine 0.76 - 1.27 mg/dL 1.08 1.63(H) 1.20  Sodium 134 - 144 mmol/L 138 136 139  Potassium 3.5 - 5.2 mmol/L 4.6 4.0 3.6  Chloride 96 - 106 mmol/L 101 106 104  CO2 20 - 29 mmol/L 20 23 25   Calcium 8.6 - 10.2 mg/dL 9.4 8.9 9.2  Total Protein 6.0 - 8.5 g/dL 6.6 - -  Total Bilirubin 0.0 - 1.2 mg/dL 0.3 - -  Alkaline Phos 44 - 121 IU/L 75 - -  AST 0 - 40 IU/L 16 - -  ALT 0 - 44 IU/L 14 - -     Diabetic Labs (most recent): Lab Results  Component Value Date   HGBA1C 9.7 (A) 03/18/2020   HGBA1C 8.7 (H) 12/05/2019   HGBA1C 9.6 (A) 08/01/2019   Lipid Panel     Component Value Date/Time   CHOL 177 08/05/2019 0334   CHOL 148 08/19/2015 0914   TRIG 95 08/05/2019 0334   HDL 56 08/05/2019 0334   HDL 53 08/19/2015 0914   CHOLHDL 3.2 08/05/2019 0334   VLDL 19 08/05/2019 0334   LDLCALC 102 (H) 08/05/2019 0334   LDLCALC 118 (H) 02/06/2019 0813     Assessment & Plan:   1) Uncontrolled type 2 diabetes mellitus with circulatory complication, with long-term current use of insulin (HCC)  -His diabetes is complicated by coronary artery disease and patient remains at an extremely high risk for more acute and chronic complications of diabetes which include CAD, CVA, CKD, retinopathy, and neuropathy. These are all discussed in detail with the patient.  He presents today with his CGM and logs showing continued tight fasting and above target postprandial glycemic profile.  His POCT A1c today is 9.7%, increasing from previous visit of 8.7%.  He has been in the hospital since last visit for Afib and had trouble getting his medications as prescribed  which affected his glucose levels.  He is continuing to have significant nocturnal hypoglycemia.     Glucose logs and insulin administration records pertaining to this visit,  to be scanned into patient's records.  Recent labs reviewed.  - Nutritional counseling repeated at each appointment due to patients tendency to fall back in to old habits.  - The patient admits there is a room for improvement in their diet and drink choices. -  Suggestion is made for the patient to avoid simple carbohydrates from their diet including Cakes, Sweet Desserts / Pastries, Ice Cream, Soda (diet and regular), Sweet Tea, Candies, Chips, Cookies, Sweet Pastries,  Store Bought Juices, Alcohol in Excess of  1-2 drinks a day, Artificial Sweeteners, Coffee Creamer, and "Sugar-free" Products. This will help patient to have stable blood glucose profile and potentially avoid unintended weight gain.   - I encouraged the patient to switch to  unprocessed or minimally processed complex starch and increased protein intake (animal or plant source), fruits, and vegetables.   - Patient is advised to stick to a routine mealtimes to eat 3 meals  a day and avoid unnecessary snacks ( to snack only to correct hypoglycemia).  - I have approached patient with the following individualized plan to manage diabetes and patient agrees.  -He will continue to need intensive treatment with higher dose of insulin.  He has been better with U500 versus [place] and regular insulin.     -Due to nocturnal hypoglycemia,  his Humulin R U-500 with be adjusted to 85 units with breakfast and lunch, and lower his supper dose to 65 units, if glucose is above 90 and he is eating.  He can continue Metformin 500 mg po twice daily with meals and Glipizide 5 mg XL daily with breakfast.  -He is encouraged to continue using his CGM to monitor glucose 4 times daily, before meals and before bed, and to call the clinic if he has readings less than 70 or greater than  300 for 3 tests in a row.  2) BP/HTN:  His blood pressure is nearly controlled to target.  He is advised to continue Lasix 40 mg po daily, Losartan 50 mg po daily, and Metoprolol 75 mg po twice daily.  3) Lipids/HPL:  His most recent lipid panel from 08/05/19 shows uncontrolled LDL of 102 (improving).  He is advised to continue Pravastatin 40 mg po daily at bedtime.  Side effects and precautions discussed with him.  4)  Weight/Diet:  His Body mass index is 25.95 kg/m.-complicating his diabetes care.  He is a candidate for modest weight loss.  CDE consult in progress, exercise, and carbohydrates information provided.  5) Hypothyroidism -His previsit thyroid function tests are consistent with appropriate hormone replacement.  He is advised to continue Levothyroxine 137 mcg po daily before breakfast.    - We discussed about the correct intake of his thyroid hormone, on empty stomach at fasting, with water, separated by at least 30 minutes from breakfast and other medications,  and separated by more than 4 hours from calcium, iron, multivitamins, acid reflux medications (PPIs). -Patient is made aware of the fact that thyroid hormone replacement is needed for life, dose to be adjusted by periodic monitoring of thyroid function tests.  6) Chronic Care/Health Maintenance: -Patient is on ACEI/ARB and Statin medications and encouraged to continue to follow up with Ophthalmology, Podiatrist at least yearly or according to recommendations, and advised to  stay away from smoking. I have recommended yearly flu vaccine and pneumonia vaccination at least every 5 years; moderate intensity exercise for up to 150 minutes weekly; and  sleep for at least 7 hours a day.  - I advised patient to maintain close follow up with Erven Colla, DO for primary care needs.  - Time spent on this patient care encounter:  40 min, of which > 50% was spent in  counseling and the rest reviewing his blood glucose logs ,  discussing his hypoglycemia and hyperglycemia episodes, reviewing his current and  previous labs / studies  ( including abstraction from other facilities) and medications  doses and developing a  long term treatment plan and documenting his care.   Please refer to Patient Instructions for Blood Glucose Monitoring and Insulin/Medications Dosing Guide"  in media tab for additional information. Please  also refer to " Patient Self Inventory" in the Media  tab for reviewed elements of pertinent patient history.  Paul Baker participated in the discussions, expressed understanding, and voiced agreement with the above plans.  All questions were answered to his satisfaction. he is encouraged to contact clinic should he have any questions or concerns prior to his return visit.   Follow up plan: -Return in about 3 months (around 06/18/2020) for Diabetes follow up with A1c in office, No previsit labs, Thyroid follow up.  Rayetta Pigg, [facility] [address] [place], [place] [address] Phone: [phone] Fax: [phone]  03/18/2020, 12:02 PM

## 2020-03-19 NOTE — Telephone Encounter (Signed)
Please advise 

## 2020-03-30 DIAGNOSIS — E1110 Type 2 diabetes mellitus with ketoacidosis without coma: Secondary | ICD-10-CM | POA: Diagnosis not present

## 2020-04-02 ENCOUNTER — Encounter: Payer: Self-pay | Admitting: Cardiology

## 2020-04-02 DIAGNOSIS — N182 Chronic kidney disease, stage 2 (mild): Secondary | ICD-10-CM | POA: Diagnosis not present

## 2020-04-02 DIAGNOSIS — Z6836 Body mass index (BMI) 36.0-36.9, adult: Secondary | ICD-10-CM | POA: Diagnosis not present

## 2020-04-02 DIAGNOSIS — R42 Dizziness and giddiness: Secondary | ICD-10-CM | POA: Diagnosis not present

## 2020-04-02 DIAGNOSIS — I129 Hypertensive chronic kidney disease with stage 1 through stage 4 chronic kidney disease, or unspecified chronic kidney disease: Secondary | ICD-10-CM | POA: Diagnosis not present

## 2020-04-02 DIAGNOSIS — I4891 Unspecified atrial fibrillation: Secondary | ICD-10-CM | POA: Diagnosis not present

## 2020-04-02 DIAGNOSIS — E1122 Type 2 diabetes mellitus with diabetic chronic kidney disease: Secondary | ICD-10-CM | POA: Diagnosis not present

## 2020-04-02 NOTE — Progress Notes (Signed)
Cardiology Office Note  Date: 04/05/2020   ID: Paul Baker, DOB 1950-08-08, MRN [id number]  PCP:  Erven Colla, DO  Cardiologist:  [name], MD Electrophysiologist:  None   Chief Complaint  Patient presents with  . Cardiac follow-up    History of Present Illness: Paul Baker is a 70 y.o. male last seen in December 2021.  He is here for a routine visit.  Reports no progressive angina symptoms or nitroglycerin use.  He is finishing up doing some house repair, has it on the market with plan to sell.  I reviewed his cardiac regimen which is detailed below.  He reports no intolerances, no spontaneous bleeding problems on Eliquis.  I reviewed his most recent lab work from February and March, hemoglobin and renal function normal.   Past Medical History:  Diagnosis Date  . Arthritis   . Asthma   . Atrial fibrillation [facility])    Diagnosed December 2021  . Colon polyp   . Coronary atherosclerosis of native coronary artery    a. 2011: cath showing 90% stenosis along small non-dominant RCA (too small for PCI). b. 01/2018: cath showing nonobstructive CAD with 60 to 70% proximal to mid nondominant RCA stenosis, 50% mid LAD and 40 to 50% OM1  . DVT (deep venous thrombosis) ([place]) 2005   Right arm  . Essential hypertension   . Headache   . History of transfusion   . Hypothyroidism   . Mixed hyperlipidemia   . Morbid obesity ([place])   . MRSA (methicillin resistant staph aureus) culture positive    08/2012  . Narcolepsy   . OSA (obstructive sleep apnea)    CPAP  . Osteoarthritis   . Pneumonia   . Psoriasis   . Pulmonary embolism ([place]) 2004  . RLS (restless legs syndrome)   . Rotator cuff disorder    Left  . Septic arthritis of knee, left ([place])   . Skin cancer, basal cell   . Type 2 diabetes mellitus ([place])     Past Surgical History:  Procedure Laterality Date  . BACK SURGERY    . CATARACT EXTRACTION Bilateral   . COLONOSCOPY    . CYST REMOVAL TRUNK      from back  . EYE SURGERY Left 2016   laser to left eye  . HIP SURGERY     bone removed from both sides of hip  . KNEE ARTHROTOMY Right 12/04/2014   Procedure: KNEE ARTHROTOMY PATELLA LIGAMENT RECONSTRUSION AND REPAIR RIGHT KNEE;  Surgeon: Paralee Cancel, MD;  Location: [place];  Service: Orthopedics;  Laterality: Right;  . KNEE SURGERY     X 25 TIMES  . LEFT HEART CATH AND CORONARY ANGIOGRAPHY N/A 01/17/2018   Procedure: LEFT HEART CATH AND CORONARY ANGIOGRAPHY;  Surgeon: Belva Crome, MD;  Location: [place] [facility];  Service: Cardiovascular;  Laterality: N/A;  . LUMBAR DISC SURGERY     Left L3, L4, L5 discecotomy with decompression of L4 root  . TONSILLECTOMY    . TOTAL KNEE ARTHROPLASTY  2003   LEFT  . TOTAL KNEE ARTHROPLASTY Right 03/23/2014   Procedure: RIGHT TOTAL KNEE ARTHROPLASTY AND REMOVAL RIGHT TIBIAL  DEEP IMPLANT STAPLE;  Surgeon: Paralee Cancel, MD;  Location: [facility];  Service: Orthopedics;  Laterality: Right;  . TOTAL KNEE REVISION  2005   LEFT  . WRIST SURGERY      Current Outpatient Medications  Medication Sig Dispense Refill  . acetaminophen (TYLENOL) 325 MG tablet Take  2 tablets (650 mg total) by mouth every 6 (six) hours as needed for mild pain (or Fever >/= 101). 30 tablet 1  . albuterol (VENTOLIN HFA) 108 (90 Base) MCG/ACT inhaler Inhale 2 puffs into the lungs every 6 (six) hours as needed for wheezing or shortness of breath. 18 g 5  . amLODipine (NORVASC) 5 MG tablet Take 1 tablet (5 mg total) by mouth daily. 30 tablet 5  . apixaban (ELIQUIS) 5 MG TABS tablet Take 1 tablet (5 mg total) by mouth 2 (two) times daily. For stroke prevention 60 tablet 5  . aspirin EC 81 MG tablet Take 1 tablet (81 mg total) by mouth daily with breakfast. Swallow whole. 150 tablet 2  . BREO ELLIPTA 200-25 MCG/INH AEPB Inhale 1 puff by mouth once daily 180 each 0  . Continuous Blood Gluc Receiver (DEXCOM G6 RECEIVER) DEVI 1 Piece by Does not apply route as needed. 1 Device 0  . Continuous  Blood Gluc Sensor (DEXCOM G6 SENSOR) MISC 4 Pieces by Does not apply route once a week. 4 each 2  . EUTHYROX 137 MCG tablet TAKE 1 TABLET BY MOUTH ONCE DAILY BEFORE BREAKFAST 90 tablet 0  . Insulin Pen Needle (B-D ULTRAFINE III SHORT PEN) 31G X 8 MM MISC USING THREE  TIMES DAILY. 100 each 5  . insulin regular human CONCENTRATED (HUMULIN R U-500 KWIKPEN) 500 UNIT/ML kwikpen Inject 75-85 Units into the skin 3 (three) times daily with meals. Inject 85 units with breakfast and lunch, and 65 units with supper if glucose is above 90 and you are eating 60 mL 2  . isosorbide mononitrate (IMDUR) 60 MG 24 hr tablet TAKE 1 TABLET BY MOUTH IN THE MORNING AND 1/2 (ONE-HALF) IN THE EVENING (Patient taking differently: Take 30-60 mg by mouth in the morning and at bedtime. 1 tablet in am, half a tablet in the evening) 135 tablet 3  . loratadine (CLARITIN) 10 MG tablet Take 10 mg by mouth daily as needed for allergies.     [name] losartan (COZAAR) 50 MG tablet Take 1 tablet (50 mg total) by mouth daily. 90 tablet 0  . metFORMIN (GLUCOPHAGE) 500 MG tablet TAKE 1 TABLET BY MOUTH TWICE DAILY WITH MEALS 180 tablet 0  . metoprolol succinate (TOPROL XL) 25 MG 24 hr tablet Take 1 tablet (25 mg total) by mouth See admin instructions. -Take 1 tablet (25 mg) along with an additional 50 mg tablet for total of 75 mg twice daily 180 tablet 2  . metoprolol succinate (TOPROL-XL) 50 MG 24 hr tablet Take 1 tablet (50 mg total) by mouth 2 (two) times daily. -Take 1 tablet (50 mg) along with an additional 25 mg tablet for total of 75 mg twice daily 180 tablet 3  . modafinil (PROVIGIL) 200 MG tablet TAKE ONE TABLET BY MOUTH ONE TIME DAILY 90 tablet 0  . ONE TOUCH ULTRA TEST test strip TEST BLOOD SUGAR UP TO 4 TIMES DAILY. 150 each 5  . ONETOUCH DELICA LANCETS 13K MISC USE AS DIRECTED TO TEST BLOOD SUGAR 4 TIMES DAILY. 150 each 5  . pravastatin (PRAVACHOL) 40 MG tablet Take 1 tablet (40 mg total) by mouth every evening. 90 tablet 1  .  rOPINIRole (REQUIP XL) 4 MG 24 hr tablet Take 1 tablet (4 mg total) by mouth at bedtime. 90 tablet 1  . rOPINIRole (REQUIP) 4 MG tablet Take 0.5 tablets (2 mg total) by mouth 2 (two) times daily. 90 tablet 1  . Vitamin D, Ergocalciferol, (  DRISDOL) 1.25 MG (50000 UNIT) CAPS capsule Take 1 capsule by mouth once a week 12 capsule 0  . nitroGLYCERIN (NITROSTAT) 0.4 MG SL tablet DISSOLVE 1 TABLET UNDER TONGUE EVERY 5 MINUTES UP TO 15 MIN FOR CHESTPAIN. IF NO RELIEF CALL 911. 25 tablet 3   No current facility-administered medications for this visit.   Allergies:  Tape, Cardizem [diltiazem hcl], Cardura [doxazosin mesylate], Lipitor [atorvastatin], Paxil [paroxetine hcl], Codeine, and Gabapentin   ROS: No palpitations or syncope.  Physical Exam: VS:  BP 132/70   Pulse 66   Ht 5\' 6"  (1.676 m)   Wt 230 lb 12.8 oz (104.7 kg)   SpO2 98%   BMI 37.25 kg/m , BMI Body mass index is 37.25 kg/m.  Wt Readings from Last 3 Encounters:  04/05/20 230 lb 12.8 oz (104.7 kg)  03/18/20 227 lb 6.4 oz (103.1 kg)  02/18/20 227 lb (103 kg)    General: Patient appears comfortable at rest. HEENT: Conjunctiva and lids normal, wearing a mask. Neck: Supple, no elevated JVP or carotid bruits, no thyromegaly. Lungs: Clear to auscultation, nonlabored breathing at rest. Cardiac: Regular rate and rhythm, no S3 or significant systolic murmur, no pericardial rub. Extremities: Trace ankle edema, .  ECG:  An ECG dated 02/18/2020 was personally reviewed today and demonstrated:  Normal sinus rhythm.  Recent Labwork: 12/05/2019: Magnesium 2.0 02/18/2020: Hemoglobin 12.9; Platelets 223 03/10/2020: ALT 14; AST 16; BUN 22; Creatinine, Ser 1.08; Potassium 4.6; Sodium 138; TSH 1.310     Component Value Date/Time   CHOL 177 08/05/2019 0334   CHOL 148 08/19/2015 0914   TRIG 95 08/05/2019 0334   HDL 56 08/05/2019 0334   HDL 53 08/19/2015 0914   CHOLHDL 3.2 08/05/2019 0334   VLDL 19 08/05/2019 0334   LDLCALC 102 (H) 08/05/2019  0334   LDLCALC 118 (H) 02/06/2019 0813    Other Studies Reviewed Today:  Echocardiogram 08/05/2019: 1. Left ventricular ejection fraction, by estimation, is 55 to 60%. The  left ventricle has normal function. The left ventricle has no regional  wall motion abnormalities. Left ventricular diastolic parameters were  normal.  2. Right ventricular systolic function is normal. The right ventricular  size is normal. Tricuspid regurgitation signal is inadequate for assessing  PA pressure.  3. Left atrial size was upper normal.  4. The mitral valve is grossly normal. Mild mitral valve regurgitation.  5. The aortic valve is tricuspid. Aortic valve regurgitation is not  visualized.  6. The inferior vena cava is normal in size with greater than 50%  respiratory variability, suggesting right atrial pressure of 3 mmHg.   Assessment and Plan:  1.  CAD, symptomatically stable with moderate proximal to mid nondominant RCA stenosis.  He remains on aspirin, Norvasc, Imdur, losartan, Toprol-XL, Pravachol, and as needed nitroglycerin.  2.  Mixed hyperlipidemia with statin intolerance (myalgias with Lipitor and Crestor).  He has done well with Pravachol.  Follow-up FLP with PCP as scheduled.  If LDL not further reduced, would increase Pravachol to 80 mg daily.  3.  Paroxysmal atrial fibrillation with CHA2DS2-VASc score of 5.  He reports no active palpitations and continues on Toprol-XL as well as Eliquis.  Recent lab work reviewed.  With previous history of stroke we have also continued low-dose aspirin.  Medication Adjustments/Labs and Tests Ordered: Current medicines are reviewed at length with the patient today.  Concerns regarding medicines are outlined above.   Tests Ordered: No orders of the defined types were placed in this encounter.  Medication Changes: Meds ordered this encounter  Medications  . nitroGLYCERIN (NITROSTAT) 0.4 MG SL tablet    Sig: DISSOLVE 1 TABLET UNDER TONGUE EVERY  5 MINUTES UP TO [place]. IF NO RELIEF CALL 911.    Dispense:  25 tablet    Refill:  3    Disposition:  Follow up 6 months.  Signed, Satira Sark, MD, [facility] 04/05/2020 8:48 AM    [place] [facility] [place], [place], [place] [address] Phone: [phone]; Fax: [phone]

## 2020-04-05 ENCOUNTER — Other Ambulatory Visit: Payer: Self-pay

## 2020-04-05 ENCOUNTER — Ambulatory Visit: Payer: Medicare HMO | Admitting: Cardiology

## 2020-04-05 ENCOUNTER — Encounter: Payer: Self-pay | Admitting: Cardiology

## 2020-04-05 VITALS — BP 132/70 | HR 66 | Ht 66.0 in | Wt 230.8 lb

## 2020-04-05 DIAGNOSIS — I25119 Atherosclerotic heart disease of native coronary artery with unspecified angina pectoris: Secondary | ICD-10-CM

## 2020-04-05 DIAGNOSIS — E782 Mixed hyperlipidemia: Secondary | ICD-10-CM | POA: Diagnosis not present

## 2020-04-05 DIAGNOSIS — I480 Paroxysmal atrial fibrillation: Secondary | ICD-10-CM | POA: Diagnosis not present

## 2020-04-05 MED ORDER — NITROGLYCERIN 0.4 MG SL SUBL: SUBLINGUAL_TABLET | SUBLINGUAL | 3 refills | Status: DC

## 2020-04-05 NOTE — Patient Instructions (Signed)
Your physician recommends that you schedule a follow-up appointment in: 6 MONTHS WITH DR MCDOWELL  Your physician recommends that you continue on your current medications as directed. Please refer to the Current Medication list given to you today.  Thank you for choosing [facility]!!    

## 2020-04-18 DIAGNOSIS — G4733 Obstructive sleep apnea (adult) (pediatric): Secondary | ICD-10-CM | POA: Diagnosis not present

## 2020-04-19 ENCOUNTER — Telehealth: Payer: Self-pay

## 2020-04-19 MED ORDER — PRAVASTATIN SODIUM 40 MG PO TABS: 40.0000 mg | ORAL_TABLET | Freq: Every evening | ORAL | 1 refills | Status: DC

## 2020-04-19 NOTE — Telephone Encounter (Signed)
Medication refill request for Pravastatin 40 mg tablets approved and sent to [facility]

## 2020-04-21 ENCOUNTER — Other Ambulatory Visit: Payer: Self-pay | Admitting: "Endocrinology

## 2020-04-23 DIAGNOSIS — R972 Elevated prostate specific antigen [PSA]: Secondary | ICD-10-CM | POA: Diagnosis not present

## 2020-04-28 ENCOUNTER — Telehealth: Payer: Self-pay

## 2020-04-28 DIAGNOSIS — Z596 Low income: Secondary | ICD-10-CM

## 2020-04-28 NOTE — Progress Notes (Signed)
[facility])  [place] Team    04/28/2020  Paul Baker [birth year] [id number]  Reason for referral: Medication Assistance  Referral source: Patient's Wife who was recently referred to [facility] through care guide Laverda Sorenson Current insurance: [employer]  PMHx includes but not limited to:  Patient has a PMH significant for paroxysmal atrial fibrillation, CAD, type 2 diabetes mellitus, RLS, asthma, and narcolepsy (not on current problems list but patient reported).   Outreach:  Successful telephone call with Paul Baker.  HIPAA identifiers verified.   Subjective:  Patient's wife referred him to [facility] team since she reported they have financial difficulties affording certain monthly medications. Overall, they live on an extremely tight budget each month. When I spoke to Paul Baker today, he stated that Breo ($47 per Paul Baker), Eliquis ($47 at [facility]), and Ropinirole XL ($100 in March w/o Paul Baker; $45 w/ Paul Baker February at [facility]) are expensive. Per patient, he takes ropinirole XL and IR simultaneously. Patient was informed that the cost difference of Ropinirole was due to the medication being filled with insurance rather than Paul Baker but patient felt the pharmacy staff should have ran it through [employer] He was getting Modafinil, for narcolepsy, 90-days filled at [place] for $90 w/Paul Baker but stopped due to cost. Patient reports discontinuing modafinil d/t cost. Of note, he does not need assistance w/ Humulin R as he currently is signed up with [employer].  Per discussion today, patient lives in a two-household family that makes [phone] ("before medicare is taken out") $2444/month; [phone] ("after medicare"). At this time, he and his wife have spent a total of $486 this year on prescriptions.  Patient reported that he is out of [employer] and Eliquis and plans to pick up medications today.   Objective:   Lab Results  Component Value Date    CREATININE 1.08 03/10/2020   CREATININE 1.63 (H) 02/18/2020   CREATININE 1.20 12/05/2019    Lab Results  Component Value Date   HGBA1C 9.7 (A) 03/18/2020    Lipid Panel     Component Value Date/Time   CHOL 177 08/05/2019 0334   CHOL 148 08/19/2015 0914   TRIG 95 08/05/2019 0334   HDL 56 08/05/2019 0334   HDL 53 08/19/2015 0914   CHOLHDL 3.2 08/05/2019 0334   VLDL 19 08/05/2019 0334   LDLCALC 102 (H) 08/05/2019 0334   LDLCALC 118 (H) 02/06/2019 0813     Allergies  Allergen Reactions  . Tape Rash    Pulls off skin  . Cardizem [Diltiazem Hcl]     Edema   . Cardura [Doxazosin Mesylate]     Headaches / cramps  . Lipitor [Atorvastatin] Other (See Comments)    Leg cramps  . Paxil [Paroxetine Hcl]     Unknown reaction   . Codeine Rash and Other (See Comments)    Headache   . Gabapentin Rash    Medications Reviewed Today    Reviewed by Satira Sark, MD (Physician) on 04/05/20 at [place] List Status: <None>  Medication Order Taking? Sig Documenting Provider Last Dose Status Informant  acetaminophen (TYLENOL) 325 MG tablet [id number] Yes Take 2 tablets (650 mg total) by mouth every 6 (six) hours as needed for mild pain (or Fever >/= 101). Roxan Hockey, MD Taking Active   albuterol (VENTOLIN HFA) 108 (90 Base) MCG/ACT inhaler [id number] Yes Inhale 2 puffs into the lungs every 6 (six) hours as needed for wheezing or shortness of breath. [name], MD  Taking Active Self  amLODipine (NORVASC) 5 MG tablet [id number] Yes Take 1 tablet (5 mg total) by mouth daily. Roxan Hockey, MD Taking Active   apixaban (ELIQUIS) 5 MG TABS tablet [id number] Yes Take 1 tablet (5 mg total) by mouth 2 (two) times daily. For stroke prevention Roxan Hockey, MD Taking Active   aspirin EC 81 MG tablet [id number] Yes Take 1 tablet (81 mg total) by mouth daily with breakfast. Swallow whole. Roxan Hockey, MD Taking Active   BREO ELLIPTA 200-25 MCG/INH AEPB [id number] Yes Inhale 1  puff by mouth once daily Chesley Mires, MD Taking Active   Continuous Blood Gluc Receiver ([place]) DEVI [id number] Yes 1 Piece by Does not apply route as needed. Cassandria Anger, MD Taking Active Self  Continuous Blood Gluc Sensor (DEXCOM G6 SENSOR) MISC [id number] Yes 4 Pieces by Does not apply route once a week. Cassandria Anger, MD Taking Active Self  EUTHYROX 137 MCG tablet [id number] Yes TAKE 1 TABLET BY MOUTH ONCE DAILY BEFORE BREAKFAST Reardon, Juanetta Beets, NP Taking Active   Insulin Pen Needle (B-D ULTRAFINE III SHORT PEN) 31G X 8 MM MISC [id number] Yes USING THREE  TIMES DAILY. Brita Romp, NP Taking Active   insulin regular human CONCENTRATED (HUMULIN R U-500 KWIKPEN) 500 UNIT/ML Claiborne Rigg [id number] Yes Inject 75-85 Units into the skin 3 (three) times daily with meals. Inject 85 units with breakfast and lunch, and 65 units with supper if glucose is above 90 and you are eating Brita Romp, NP Taking Active   isosorbide mononitrate (IMDUR) 60 MG 24 hr tablet [id number] Yes TAKE 1 TABLET BY MOUTH IN THE MORNING AND 1/2 (ONE-HALF) IN THE EVENING  Patient taking differently: Take 30-60 mg by mouth in the morning and at bedtime. 1 tablet in am, half a tablet in the evening   Satira Sark, MD Taking Active Self  loratadine (CLARITIN) 10 MG tablet [id number] Yes Take 10 mg by mouth daily as needed for allergies.  [provider] Taking Active Self           Med Note [facility] MENDEZ, CARLOS A   Tue Jul 01, 2019  4:07 PM)    losartan (COZAAR) 50 MG tablet [id number] Yes Take 1 tablet (50 mg total) by mouth daily. Elvia Collum M, DO Taking Active Self  metFORMIN (GLUCOPHAGE) 500 MG tablet [id number] Yes TAKE 1 TABLET BY MOUTH TWICE DAILY WITH MEALS Nida, Marella Chimes, MD Taking Active   metoprolol succinate (TOPROL XL) 25 MG 24 hr tablet [id number] Yes Take 1 tablet (25 mg total) by mouth See admin instructions. -Take 1 tablet (25 mg) along with an additional 50  mg tablet for total of 75 mg twice daily Emokpae, Courage, MD Taking Active   metoprolol succinate (TOPROL-XL) 50 MG 24 hr tablet [id number] Yes Take 1 tablet (50 mg total) by mouth 2 (two) times daily. -Take 1 tablet (50 mg) along with an additional 25 mg tablet for total of 75 mg twice daily Emokpae, Courage, MD Taking Active   modafinil (PROVIGIL) 200 MG tablet [id number] Yes TAKE ONE TABLET BY MOUTH ONE TIME DAILY Lomax, Amy, NP Taking Active Self  nitroGLYCERIN (NITROSTAT) 0.4 MG SL tablet [id number] Yes DISSOLVE 1 TABLET UNDER TONGUE EVERY 5 MINUTES UP TO 15 MIN FOR CHESTPAIN. IF NO RELIEF CALL 911.  Patient taking differently: Place 0.4 mg under the tongue every 5 (five) minutes as needed for chest pain.   Satira Sark, MD Taking Active  Med Note [facility], CARLOS A   Tue Jul 01, 2019  4:07 PM)    ONE TOUCH ULTRA TEST test strip [id number] Yes TEST BLOOD SUGAR UP TO 4 TIMES DAILY. Cassandria Anger, MD Taking Active Self  [facility] LANCETS [address] [place] [id number] Yes USE AS DIRECTED TO TEST BLOOD SUGAR 4 TIMES DAILY. Cassandria Anger, MD Taking Active Self  pravastatin (PRAVACHOL) 40 MG tablet [id number] Yes Take 1 tablet (40 mg total) by mouth every evening. Elvia Collum M, DO Taking Active Self  rOPINIRole (REQUIP XL) 4 MG 24 hr tablet [id number] Yes Take 1 tablet (4 mg total) by mouth at bedtime. Lomax, Amy, NP Taking Active   rOPINIRole (REQUIP) 4 MG tablet [id number] Yes Take 0.5 tablets (2 mg total) by mouth 2 (two) times daily. Lomax, Amy, NP Taking Active   Vitamin D, Ergocalciferol, (DRISDOL) 1.25 MG (50000 UNIT) CAPS capsule [id number] Yes Take 1 capsule by mouth once a week Nida, Marella Chimes, MD Taking Active Self          Assessment: Patient does not meet criteria for PAP opportunities right now for Eliquis/Breo Ellipta but wishes to begin the application process. As for Modafinil (tier 3) and Roinorole XL (tier 4), patient could continue to use  Paul Baker and fill medication for a 30 days supply rather than 90 days supply due to affordability each month. Per [place], alternative dopamine agonists (pramipexole XL and Neupro) will cost the same since they are tier 4 as well. Armodafinil and Xyrem are tier 4 and 5 respectively. Additionally, I will see if the patient qualifies for RxOutreach through needy meds. Since patient has not had modafinil filled he would need a new prescription from provider.  Per Paul Baker coupon, ropinirole XL and Modafinil are cheaper at [employer].    Medication Review Findings:  Breo ($47 per [facility]), Eliquis ($47), and Ropinirole XL (@ $100 in March w/o Paul Baker; $45 w/ Paul Baker February) are expensive. He stopping taking Modafinil d/t cost.     Medication Assistance Findings:  Medication assistance needs identified:  Breo, Eliquis, Ropinirole XL, Modafinil  Extra Help:  Not eligible for Extra Help Low Income Subsidy based on reported income and assets  Patient Assistance Programs: [place] Patter made by [place] requirement met: Yes o Out-of-pocket prescription expenditure met:   No (OOP expense to meet criteria is $600 ) - Patient has not met application requirements to apply for this program at this time.    Eliquis made by [place] requirement met: Yes o Out-of-pocket prescription expenditure met:   No (patient needs to meet 3% of OOP expenses equivalent to his income) - Patient has not met application requirements to apply for this program at this time.    Modafinil and Ropinirole XL  o Income requirement met: Yes o Out-of-pocket prescription expenditure met:   Yes - Patient will apply for a cheaper option via RxOutreach application that will be sent to his house   Additional medication assistance options reviewed with patient as warranted:  Coupon and Discount pharmacy lists - Paul Baker for Modafinil and Ropinirole XL  Plan:  Although patient does not meet criteria  for patient assistance for Elliquis/Breo Ellipta, will send application as per patient request.   Will message Amy Lomax regarding modafinil prescription  Will follow up to make sure patient picked up medications  Will send RxOutreach application to the patient along with the other application. I discussed the application steps with the patient and he  verbalized his understanding.   Will also remind patient to use Paul Baker for both Ropinirole XL and Modafinil and see if patient is open to getting those filled at [facility].   Thank you for your time!    Kristeen Miss, PharmD Clinical Pharmacist [place] Cell: [phone]

## 2020-04-29 ENCOUNTER — Other Ambulatory Visit: Payer: Self-pay | Admitting: Family Medicine

## 2020-04-29 DIAGNOSIS — Z008 Encounter for other general examination: Secondary | ICD-10-CM | POA: Diagnosis not present

## 2020-04-29 DIAGNOSIS — Z794 Long term (current) use of insulin: Secondary | ICD-10-CM | POA: Diagnosis not present

## 2020-04-29 DIAGNOSIS — E1122 Type 2 diabetes mellitus with diabetic chronic kidney disease: Secondary | ICD-10-CM | POA: Diagnosis not present

## 2020-04-29 DIAGNOSIS — I130 Hypertensive heart and chronic kidney disease with heart failure and stage 1 through stage 4 chronic kidney disease, or unspecified chronic kidney disease: Secondary | ICD-10-CM | POA: Diagnosis not present

## 2020-04-29 DIAGNOSIS — J449 Chronic obstructive pulmonary disease, unspecified: Secondary | ICD-10-CM | POA: Diagnosis not present

## 2020-04-29 DIAGNOSIS — E261 Secondary hyperaldosteronism: Secondary | ICD-10-CM | POA: Diagnosis not present

## 2020-04-29 DIAGNOSIS — I509 Heart failure, unspecified: Secondary | ICD-10-CM | POA: Diagnosis not present

## 2020-04-29 DIAGNOSIS — D6869 Other thrombophilia: Secondary | ICD-10-CM | POA: Diagnosis not present

## 2020-04-29 DIAGNOSIS — I4891 Unspecified atrial fibrillation: Secondary | ICD-10-CM | POA: Diagnosis not present

## 2020-04-29 DIAGNOSIS — I25119 Atherosclerotic heart disease of native coronary artery with unspecified angina pectoris: Secondary | ICD-10-CM | POA: Diagnosis not present

## 2020-04-29 MED ORDER — MODAFINIL 200 MG PO TABS: 200.0000 mg | ORAL_TABLET | Freq: Every day | ORAL | 5 refills | Status: DC

## 2020-04-30 DIAGNOSIS — E1110 Type 2 diabetes mellitus with ketoacidosis without coma: Secondary | ICD-10-CM | POA: Diagnosis not present

## 2020-05-04 ENCOUNTER — Telehealth: Payer: Self-pay | Admitting: Pharmacy Technician

## 2020-05-04 DIAGNOSIS — Z596 Low income: Secondary | ICD-10-CM

## 2020-05-04 NOTE — Progress Notes (Signed)
[facility])                                            [place] Team    05/04/2020  Paul Baker 1950-09-13 [id number]                                       Medication Assistance Referral  Referral From: [facility] RPh Asajah Damita Dunnings   NOTE: Patient is a self referral (currently helping his spouse as well)  Medication/Company: Eliquis / BMS Patient application portion:  Mailed Provider application portion: Faxed  to Dr. [name] Provider address/fax verified via: Office website  Medication/Company: [employer] [facility] [place] [facility] Patient application portion:  Mailed Provider application portion: Faxed  to Dr. Chesley Mires Provider address/fax verified via: Office website    **Also provided patient information to apply on his own to [place] for Modafinil and Ropinirole XL as well as information for a needymeds discount cardIAC/[employer]Active[employer] [name], [place]  [phone]

## 2020-05-05 ENCOUNTER — Telehealth: Payer: Self-pay

## 2020-05-05 ENCOUNTER — Other Ambulatory Visit: Payer: Self-pay

## 2020-05-05 DIAGNOSIS — Z596 Low income: Secondary | ICD-10-CM

## 2020-05-05 MED ORDER — BREO ELLIPTA 200-25 MCG/INH IN AEPB: INHALATION_SPRAY | RESPIRATORY_TRACT | 3 refills | Status: DC

## 2020-05-05 NOTE — Progress Notes (Signed)
Paul Paul Baker)  [place] Team    05/05/2020  Paul Baker July 11, 1950 [id number]  Reason for referral: Medication Assistance  Referral source: Patient's Wife who was recently referred to [facility] through care guide Paul Baker Current insurance: [employer]  PMHx includes but not limited to:  Patient has a PMH significant for paroxysmal atrial fibrillation, CAD, type 2 diabetes mellitus, RLS, asthma, and narcolepsy (not on current problems list but patient reported).   Outreach:  Successful telephone call with Paul Baker.  HIPAA identifiers verified.   Subjective:  Modafinil prescription was sent to [facility] and it cost $28 - patient was informed of the cost. I called [facility] with the patient on the phone to refill Ropinirole XL and it was initially $100 through insurance but I had to request to use the [employer] (which reduced the price to $38 this month). Per [facility], the patient has to ask for the GoodRx coupon to be used if a medication is expensive through the primary insurance. I asked the [facility] pharmacist if a note could be left on his profile to use GoodRx automatically for Ropinirole XL and she said that she would.   Patient has not received the application for RxOutreach yet and plans to complete the application without the assistance of a [facility] Pharmacist. He will continue to use GoodRx in the interim while he works on the [employer] application.    Objective:   Lab Results  Component Value Date   CREATININE 1.08 03/10/2020   CREATININE 1.63 (H) 02/18/2020   CREATININE 1.20 12/05/2019    Lab Results  Component Value Date   HGBA1C 9.7 (A) 03/18/2020    Lipid Panel     Component Value Date/Time   CHOL 177 08/05/2019 0334   CHOL 148 08/19/2015 0914   TRIG 95 08/05/2019 0334   HDL 56 08/05/2019 0334   HDL 53 08/19/2015 0914   CHOLHDL 3.2 08/05/2019 0334   VLDL 19 08/05/2019 0334   LDLCALC 102 (H) 08/05/2019 0334   LDLCALC  118 (H) 02/06/2019 0813     Allergies  Allergen Reactions  . Tape Rash    Pulls off skin  . Cardizem [Diltiazem Hcl]     Edema   . Cardura [Doxazosin Mesylate]     Headaches / cramps  . Lipitor [Atorvastatin] Other (See Comments)    Leg cramps  . Paxil [Paroxetine Hcl]     Unknown reaction   . Codeine Rash and Other (See Comments)    Headache   . Gabapentin Rash    Medications Reviewed Today    Reviewed by Satira Sark, MD (Physician) on 04/05/20 at [place] List Status: <None>  Medication Order Taking? Sig Documenting Provider Last Dose Status Informant  acetaminophen (TYLENOL) 325 MG tablet [id number] Yes Take 2 tablets (650 mg total) by mouth every 6 (six) hours as needed for mild pain (or Fever >/= 101). Roxan Hockey, MD Taking Active   albuterol (VENTOLIN HFA) 108 (90 Base) MCG/ACT inhaler [id number] Yes Inhale 2 puffs into the lungs every 6 (six) hours as needed for wheezing or shortness of breath. [name], MD Taking Active Self  amLODipine (NORVASC) 5 MG tablet [id number] Yes Take 1 tablet (5 mg total) by mouth daily. Roxan Hockey, MD Taking Active   apixaban (ELIQUIS) 5 MG TABS tablet [id number] Yes Take 1 tablet (5 mg total) by mouth 2 (two) times daily. For stroke prevention Roxan Hockey, MD Taking Active   aspirin EC 81  MG tablet [id number] Yes Take 1 tablet (81 mg total) by mouth daily with breakfast. Swallow whole. Roxan Hockey, MD Taking Active   BREO ELLIPTA 200-25 MCG/INH AEPB [id number] Yes Inhale 1 puff by mouth once daily Chesley Mires, MD Taking Active   Continuous Blood Gluc Receiver ([place]) DEVI [id number] Yes 1 Piece by Does not apply route as needed. Cassandria Anger, MD Taking Active Self  Continuous Blood Gluc Sensor (DEXCOM G6 SENSOR) MISC [id number] Yes 4 Pieces by Does not apply route once a week. Cassandria Anger, MD Taking Active Self  EUTHYROX 137 MCG tablet [id number] Yes TAKE 1 TABLET BY MOUTH ONCE DAILY  BEFORE BREAKFAST Reardon, Juanetta Beets, NP Taking Active   Insulin Pen Needle (B-D ULTRAFINE III SHORT PEN) 31G X 8 MM MISC [id number] Yes USING THREE  TIMES DAILY. Brita Romp, NP Taking Active   insulin regular human CONCENTRATED (HUMULIN R U-500 KWIKPEN) 500 UNIT/ML Claiborne Rigg [id number] Yes Inject 75-85 Units into the skin 3 (three) times daily with meals. Inject 85 units with breakfast and lunch, and 65 units with supper if glucose is above 90 and you are eating Brita Romp, NP Taking Active   isosorbide mononitrate (IMDUR) 60 MG 24 hr tablet [id number] Yes TAKE 1 TABLET BY MOUTH IN THE MORNING AND 1/2 (ONE-HALF) IN THE EVENING  Patient taking differently: Take 30-60 mg by mouth in the morning and at bedtime. 1 tablet in am, half a tablet in the evening   Satira Sark, MD Taking Active Self  loratadine (CLARITIN) 10 MG tablet [id number] Yes Take 10 mg by mouth daily as needed for allergies.  [provider] Taking Active Self           Med Note [facility] Baker, Paul A   Tue Jul 01, 2019  4:07 PM)    losartan (COZAAR) 50 MG tablet [id number] Yes Take 1 tablet (50 mg total) by mouth daily. Elvia Collum M, DO Taking Active Self  metFORMIN (GLUCOPHAGE) 500 MG tablet [id number] Yes TAKE 1 TABLET BY MOUTH TWICE DAILY WITH MEALS Nida, Marella Chimes, MD Taking Active   metoprolol succinate (TOPROL XL) 25 MG 24 hr tablet [id number] Yes Take 1 tablet (25 mg total) by mouth See admin instructions. -Take 1 tablet (25 mg) along with an additional 50 mg tablet for total of 75 mg twice daily Emokpae, Courage, MD Taking Active   metoprolol succinate (TOPROL-XL) 50 MG 24 hr tablet [id number] Yes Take 1 tablet (50 mg total) by mouth 2 (two) times daily. -Take 1 tablet (50 mg) along with an additional 25 mg tablet for total of 75 mg twice daily Emokpae, Courage, MD Taking Active   modafinil (PROVIGIL) 200 MG tablet [id number] Yes TAKE ONE TABLET BY MOUTH ONE TIME DAILY Lomax, Amy, NP Taking Active  Self  nitroGLYCERIN (NITROSTAT) 0.4 MG SL tablet [id number] Yes DISSOLVE 1 TABLET UNDER TONGUE EVERY 5 MINUTES UP TO 15 MIN FOR CHESTPAIN. IF NO RELIEF CALL 911.  Patient taking differently: Place 0.4 mg under the tongue every 5 (five) minutes as needed for chest pain.   Satira Sark, MD Taking Active            Med Note [facility] Baker, Percell Belt Jul 01, 2019  4:07 PM)    ONE TOUCH ULTRA TEST test strip [id number] Yes TEST BLOOD SUGAR UP TO 4 TIMES DAILY. Cassandria Anger, MD Taking Active Self  Jonetta Speak LANCETS 17C [place] [id number] Yes USE AS  DIRECTED TO TEST BLOOD SUGAR 4 TIMES DAILY. Cassandria Anger, MD Taking Active Self  pravastatin (PRAVACHOL) 40 MG tablet [id number] Yes Take 1 tablet (40 mg total) by mouth every evening. Elvia Collum M, DO Taking Active Self  rOPINIRole (REQUIP XL) 4 MG 24 hr tablet [id number] Yes Take 1 tablet (4 mg total) by mouth at bedtime. Lomax, Amy, NP Taking Active   rOPINIRole (REQUIP) 4 MG tablet [id number] Yes Take 0.5 tablets (2 mg total) by mouth 2 (two) times daily. Lomax, Amy, NP Taking Active   Vitamin D, Ergocalciferol, (DRISDOL) 1.25 MG (50000 UNIT) CAPS capsule [id number] Yes Take 1 capsule by mouth once a week Cassandria Anger, MD Taking Active Self          Assessment:  Ropinirole XL and Modafinil are less expensive using GoodRx yet the price appears to vary each month. However, patient wants to see if the cost would be lower if approved with the RxOutreach and declines assistance with application from [place].  Per chart review, PAP applications for Breo Ellipta and Eliquis have been sent to the patient.      Plan:  Patient plans to pick up both Modafinil and  Ropinirole XL this week. Reminded patient to ask for the GoodRx coupon for the aforementioned medications and he verbalized understanding.   Patient was offered assistance with RxOutreach application again by this pharmacist, but he politely declined  once again. Patient was informed to call me if he has any questions with the application and I will close follow-up from a pharmacist standpoint.   Thank you for your time!    Kristeen Miss, PharmD Clinical Pharmacist [place] Cell: [phone]

## 2020-05-10 ENCOUNTER — Encounter: Payer: Self-pay | Admitting: Physician Assistant

## 2020-05-10 ENCOUNTER — Telehealth: Payer: Medicare HMO | Admitting: Physician Assistant

## 2020-05-10 DIAGNOSIS — U071 COVID-19: Secondary | ICD-10-CM

## 2020-05-10 MED ORDER — BENZONATATE 100 MG PO CAPS: 100.0000 mg | ORAL_CAPSULE | Freq: Two times a day (BID) | ORAL | 0 refills | Status: DC | PRN

## 2020-05-10 NOTE — Progress Notes (Signed)
[name], Paul Baker are scheduled for a virtual visit with your provider today.    Just as we do with appointments in the office, we must obtain your consent to participate.  Your consent will be active for this visit and any virtual visit you may have with one of our providers in the next 365 days.    If you have a MyChart account, I can also send a copy of this consent to you electronically.  All virtual visits are billed to your insurance company just like a traditional visit in the office.  As this is a virtual visit, video technology does not allow for your provider to perform a traditional examination.  This may limit your provider's ability to fully assess your condition.  If your provider identifies any concerns that need to be evaluated in person or the need to arrange testing such as labs, EKG, etc, we will make arrangements to do so.    Although advances in technology are sophisticated, we cannot ensure that it will always work on either your end or our end.  If the connection with a video visit is poor, we may have to switch to a telephone visit.  With either a video or telephone visit, we are not always able to ensure that we have a secure connection.   I need to obtain your verbal consent now.   Are you willing to proceed with your visit today?   Paul Baker has provided verbal consent on 05/10/2020 for a virtual visit (video or telephone).   Paul Daring, PA-C 05/10/2020  9:01 AM   MyChart Video Visit    Virtual Visit via Video Note   This visit type was conducted due to national recommendations for restrictions regarding the COVID-19 Pandemic (e.g. social distancing) in an effort to limit this patient's exposure and mitigate transmission in our community. This patient is at least at moderate risk for complications without adequate follow up. This format is felt to be most appropriate for this patient at this time. Physical exam was limited by quality of the video and  audio technology used for the visit.   Video would not work, visit was continued with audio only via Chartered loss adjuster  Patient location: Home Provider location: Home office in [place], [place]  I discussed the limitations of evaluation and management by telemedicine and the availability of in person appointments. The patient expressed understanding and agreed to proceed.  Patient: Paul Baker   DOB: 1950-01-16   70 y.o. Male  MRN: [id number] Visit Date: 05/10/2020  Today's healthcare provider: Mar Daring, PA-C   No chief complaint on file.  Subjective    URI  This is a new problem. The current episode started in the past 7 days (Symptoms started last Thursday 05/06/20). The problem has been gradually worsening. There has been no fever. Associated symptoms include congestion, coughing, ear pain (right), headaches and a sore throat. Pertinent negatives include no abdominal pain, nausea, rhinorrhea, sinus pain or wheezing. He has tried acetaminophen and increased fluids (delsym) for the symptoms. The treatment provided mild relief.    Tested positive for covid 19 last night (05/09/20) on home test.  Patient Active Problem List   Diagnosis Date Noted  . Acute coronary syndrome without high troponin ([place]) 12/05/2019  . Grade I diastolic dysfunction 41/32/4401  . Type 2 diabetes mellitus ([place])   . Paroxysmal atrial fibrillation (HCC)   . Hypothyroidism   . Acute bacterial rhinosinusitis 11/18/2019  . Cough 11/18/2019  .  Paresthesia of skin 08/05/2019  . Asthma 08/05/2019  . Class 2 obesity 08/05/2019  . Right sided weakness 08/05/2019  . Vertigo 08/04/2019  . Dizziness 02/18/2019  . Type 2 diabetes mellitus with hypoglycemia without coma ([place]) 03/31/2017  . AKI (acute kidney injury) ([place]) 03/30/2017  . Right patellar tendon rupture 12/04/2014  . Mixed hyperlipidemia 11/25/2014  . Other specified hypothyroidism 11/25/2014  . S/P right TKA 03/23/2014  . S/P knee replacement  03/23/2014  . Spinal stenosis of cervicothoracic region 10/08/2013  . RLS (restless legs syndrome) 10/08/2013  . DM type 2 causing vascular disease ([place]) 10/08/2013  . Preoperative cardiovascular examination 08/14/2013  . Sleep apnea with use of continuous positive airway pressure (CPAP) 04/02/2013  . 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/02/2013  . Diabetic neuropathy ([place]) 07/23/2012  . Obesity, morbid ([place]) 05/01/2012  . Hypersomnia, persistent 05/01/2012  . OSA on CPAP 04/19/2012  . Chest pain 04/19/2012  . Essential hypertension, benign 12/16/2009  . CORONARY ATHEROSCLEROSIS NATIVE CORONARY ARTERY 12/16/2009  . Hyperglycemia due to type 2 diabetes mellitus ([place]) 11/30/2009  . Accelerating angina ([place]) 11/30/2009   Past Medical History:  Diagnosis Date  . Arthritis   . Asthma   . Atrial fibrillation [facility])    Diagnosed December 2021  . Colon polyp   . Coronary atherosclerosis of native coronary artery    a. 2011: cath showing 90% stenosis along small non-dominant RCA (too small for PCI). b. 01/2018: cath showing nonobstructive CAD with 60 to 70% proximal to mid nondominant RCA stenosis, 50% mid LAD and 40 to 50% OM1  . DVT (deep venous thrombosis) ([place]) 2005   Right arm  . Essential hypertension   . Headache   . History of transfusion   . Hypothyroidism   . Mixed hyperlipidemia   . Morbid obesity ([place])   . MRSA (methicillin resistant staph aureus) culture positive    08/2012  . Narcolepsy   . OSA (obstructive sleep apnea)    CPAP  . Osteoarthritis   . Pneumonia   . Psoriasis   . Pulmonary embolism ([place]) 2004  . RLS (restless legs syndrome)   . Rotator cuff disorder    Left  . Septic arthritis of knee, left ([place])   . Skin cancer, basal cell   . Type 2 diabetes mellitus (HCC)       Medications: Outpatient Medications Prior to Visit  Medication Sig  . acetaminophen (TYLENOL) 325 MG tablet Take 2 tablets (650 mg total) by mouth every 6 (six) hours as  needed for mild pain (or Fever >/= 101).  [name] albuterol (VENTOLIN HFA) 108 (90 Base) MCG/ACT inhaler Inhale 2 puffs into the lungs every 6 (six) hours as needed for wheezing or shortness of breath.  [name] amLODipine (NORVASC) 5 MG tablet Take 1 tablet (5 mg total) by mouth daily.  [name] apixaban (ELIQUIS) 5 MG TABS tablet Take 1 tablet (5 mg total) by mouth 2 (two) times daily. For stroke prevention  . aspirin EC 81 MG tablet Take 1 tablet (81 mg total) by mouth daily with breakfast. Swallow whole.  . Continuous Blood Gluc Receiver (DEXCOM G6 RECEIVER) DEVI 1 Piece by Does not apply route as needed.  . Continuous Blood Gluc Sensor (DEXCOM G6 SENSOR) MISC 4 Pieces by Does not apply route once a week.  Arna Medici 137 MCG tablet TAKE 1 TABLET BY MOUTH ONCE DAILY BEFORE BREAKFAST  . fluticasone furoate-vilanterol (BREO ELLIPTA) 200-25 MCG/INH AEPB Inhale 1 puff by mouth once daily  .  Insulin Pen Needle (B-D ULTRAFINE III SHORT PEN) 31G X 8 MM MISC USING THREE  TIMES DAILY.  [name] insulin regular human CONCENTRATED (HUMULIN R U-500 KWIKPEN) 500 UNIT/ML kwikpen Inject 75-85 Units into the skin 3 (three) times daily with meals. Inject 85 units with breakfast and lunch, and 65 units with supper if glucose is above 90 and you are eating  . isosorbide mononitrate (IMDUR) 60 MG 24 hr tablet TAKE 1 TABLET BY MOUTH IN THE MORNING AND 1/2 (ONE-HALF) IN THE EVENING (Patient taking differently: Take 30-60 mg by mouth in the morning and at bedtime. 1 tablet in am, half a tablet in the evening)  . loratadine (CLARITIN) 10 MG tablet Take 10 mg by mouth daily as needed for allergies.   [name] losartan (COZAAR) 50 MG tablet Take 1 tablet (50 mg total) by mouth daily.  . metFORMIN (GLUCOPHAGE) 500 MG tablet TAKE 1 TABLET BY MOUTH TWICE DAILY WITH MEALS  . metoprolol succinate (TOPROL XL) 25 MG 24 hr tablet Take 1 tablet (25 mg total) by mouth See admin instructions. -Take 1 tablet (25 mg) along with an additional 50 mg tablet for total of 75  mg twice daily  . metoprolol succinate (TOPROL-XL) 50 MG 24 hr tablet Take 1 tablet (50 mg total) by mouth 2 (two) times daily. -Take 1 tablet (50 mg) along with an additional 25 mg tablet for total of 75 mg twice daily  . modafinil (PROVIGIL) 200 MG tablet Take 1 tablet (200 mg total) by mouth daily.  . nitroGLYCERIN (NITROSTAT) 0.4 MG SL tablet DISSOLVE 1 TABLET UNDER TONGUE EVERY 5 MINUTES UP TO 15 MIN FOR CHESTPAIN. IF NO RELIEF CALL 911.  [name] ONE TOUCH ULTRA TEST test strip TEST BLOOD SUGAR UP TO 4 TIMES DAILY.  [name] ONETOUCH DELICA LANCETS 99B MISC USE AS DIRECTED TO TEST BLOOD SUGAR 4 TIMES DAILY.  . pravastatin (PRAVACHOL) 40 MG tablet Take 1 tablet (40 mg total) by mouth every evening.  [name] rOPINIRole (REQUIP XL) 4 MG 24 hr tablet Take 1 tablet (4 mg total) by mouth at bedtime.  [name] rOPINIRole (REQUIP) 4 MG tablet Take 0.5 tablets (2 mg total) by mouth 2 (two) times daily.  . Vitamin D, Ergocalciferol, (DRISDOL) 1.25 MG (50000 UNIT) CAPS capsule Take 1 capsule by mouth once a week   No facility-administered medications prior to visit.    Review of Systems  Constitutional: Positive for fatigue. Negative for chills and fever.  HENT: Positive for congestion, ear pain (right), postnasal drip and sore throat. Negative for rhinorrhea and sinus pain.   Respiratory: Positive for cough, chest tightness and shortness of breath. Negative for wheezing.   Cardiovascular: Negative.   Gastrointestinal: Negative for abdominal pain and nausea.  Neurological: Positive for headaches.    Last CBC Lab Results  Component Value Date   WBC 8.8 02/18/2020   HGB 12.9 (L) 02/18/2020   HCT 39.1 02/18/2020   MCV 91.4 02/18/2020   MCH 30.1 02/18/2020   RDW 13.9 02/18/2020   PLT 223 71/69/6789   Last metabolic panel Lab Results  Component Value Date   GLUCOSE 266 (H) 03/10/2020   NA 138 03/10/2020   K 4.6 03/10/2020   CL 101 03/10/2020   CO2 20 03/10/2020   BUN 22 03/10/2020   CREATININE 1.08 03/10/2020    GFRNONAA 46 (L) 02/18/2020   GFRAA 87 12/04/2019   CALCIUM 9.4 03/10/2020   PHOS 4.0 12/05/2019   PROT 6.6 03/10/2020   ALBUMIN 4.3 03/10/2020  LABGLOB 2.3 03/10/2020   AGRATIO 1.9 03/10/2020   BILITOT 0.3 03/10/2020   ALKPHOS 75 03/10/2020   AST 16 03/10/2020   ALT 14 03/10/2020   ANIONGAP 7 02/18/2020      Objective    There were no vitals taken for this visit. BP Readings from Last 3 Encounters:  04/05/20 132/70  03/18/20 (!) 142/75  02/18/20 131/75   Wt Readings from Last 3 Encounters:  04/05/20 230 lb 12.8 oz (104.7 kg)  03/18/20 227 lb 6.4 oz (103.1 kg)  02/18/20 227 lb (103 kg)      Physical Exam Pulmonary:     Effort: No respiratory distress (able to speak in full sentences without issue).       Assessment & Plan     1. COVID-19 - Continue OTC symptomatic management of choice - Will send OTC vitamins and supplement information through AVS - Tessalon perles prescribed - Patient enrolled in MyChart symptom monitoring - Push fluids - Rest as needed - Referral to Covid treatment team placed, consult appreciated - Discussed return precautions and when to seek in-person evaluation, sent via AVS as well - Ambulatory referral for Covid Treatment - benzonatate (TESSALON) 100 MG capsule; Take 1 capsule (100 mg total) by mouth 2 (two) times daily as needed for cough.  Dispense: 20 capsule; Refill: 0 - MyChart COVID-19 home monitoring program; Future - Temperature monitoring; Future   No follow-ups on file.     I discussed the assessment and treatment plan with the patient. The patient was provided an opportunity to ask questions and all were answered. The patient agreed with the plan and demonstrated an understanding of the instructions.   The patient was advised to call back or seek an in-person evaluation if the symptoms worsen or if the condition fails to improve as anticipated.  I provided 15 minutes of face-to-face time during this encounter via  MyChart Video enabled encounter.   [place] [phone] (phone) [phone] (fax)  [place]

## 2020-05-10 NOTE — Patient Instructions (Signed)
Hello Paul Baker,  You are being placed in the home monitoring program for COVID-19 (commonly known as Coronavirus).  This is because you are suspected to have the virus or are known to have the virus.  If you are unsure which group you fall into call your clinic.    As part of this program, you'll answer a daily questionnaire in the MyChart mobile app. You'll receive a notification through the MyChart app when the questionnaire is available. When you log in to MyChart, you'll see the tasks in your To Do activity.       Clinicians will see any answers that are concerning and take appropriate steps.  If at any point you are having a medical emergency, call 911.  If otherwise concerned call your clinic instead of coming into the clinic or hospital.  To keep from spreading the disease you should: Stay home and limit contact with other people as much as possible.  Wash your hands frequently. Cover your coughs and sneezes with a tissue, and throw used tissues in the trash.   Clean and disinfect frequently touched surfaces and objects.    Take care of yourself by: Staying home Resting Drinking fluids Take fever-reducing medications (Tylenol/Acetaminophen and Ibuprofen)  For more information on the disease go to the Centers for Disease Control and Prevention website     Can take to lessen severity: Vit C 500mg  twice daily Quercertin 250-500mg  twice daily Zinc 75-100mg  daily Melatonin 3-6 mg at bedtime Vit D3 1000-2000 IU daily Aspirin 81 mg daily with food Optional: Famotidine 20mg  daily Also can add tylenol/ibuprofen as needed for fevers and body aches May add Mucinex or Mucinex DM as needed for cough/congestion  10 Things You Can Do to Manage Your COVID-19 Symptoms at Home If you have possible or confirmed COVID-19: 1. Stay home except to get medical care. 2. Monitor your symptoms carefully. If your symptoms get worse, call your healthcare provider immediately. 3. Get rest and stay  hydrated. 4. If you have a medical appointment, call the healthcare provider ahead of time and tell them that you have or may have COVID-19. 5. For medical emergencies, call 911 and notify the dispatch personnel that you have or may have COVID-19. 6. Cover your cough and sneezes with a tissue or use the inside of your elbow. 7. Wash your hands often with soap and water for at least 20 seconds or clean your hands with an alcohol-based hand sanitizer that contains at least 60% alcohol. 8. As much as possible, stay in a specific room and away from other people in your home. Also, you should use a separate bathroom, if available. If you need to be around other people in or outside of the home, wear a mask. 9. Avoid sharing personal items with other people in your household, like dishes, towels, and bedding. 10. Clean all surfaces that are touched often, like counters, tabletops, and doorknobs. Use household cleaning sprays or wipes according to the label instructions. June 07/18/2019 This information is not intended to replace advice given to you by your health care provider. Make sure you discuss any questions you have with your health care provider. Document Revised: 11/03/2019 Document Reviewed: 11/03/2019 Elsevier Patient Education  2021 [place]: What to Do if You Are Sick If you have a fever, cough or other symptoms, you might have COVID-19. Most people have mild illness and are able to recover at home. If you are sick:  Keep track of your symptoms.  If you have an emergency warning sign (including trouble breathing), call 911. Steps to help prevent the spread of COVID-19 if you are sick If you are sick with COVID-19 or think you might have COVID-19, follow the steps below to care for yourself and to help protect other people in your home and community. Stay home except to get medical care  Stay home. Most people with COVID-19 have mild illness and can  recover at home without medical care. Do not leave your home, except to get medical care. Do not visit public areas.  Take care of yourself. Get rest and stay hydrated. Take over-the-counter medicines, such as acetaminophen, to help you feel better.  Stay in touch with your doctor. Call before you get medical care. Be sure to get care if you have trouble breathing, or have any other emergency warning signs, or if you think it is an emergency.  Avoid public transportation, ride-sharing, or taxis. Separate yourself from other people As much as possible, stay in a specific room and away from other people and pets in your home. If possible, you should use a separate bathroom. If you need to be around other people or animals in or outside of the home, wear a mask. Tell your close contactsthat they may have been exposed to COVID-19. An infected person can spread COVID-19 starting 48 hours (or 2 days) before the person has any symptoms or tests positive. By letting your close contacts know they may have been exposed to COVID-19, you are helping to protect everyone.  Additional guidance is available for those living in close quarters and shared housing.  See COVID-19 and Animals if you have questions about pets.  If you are diagnosed with COVID-19, someone from the health department may call you. Answer the call to slow the spread. Monitor your symptoms  Symptoms of COVID-19 include fever, cough, or other symptoms.  Follow care instructions from your healthcare provider and local health department. Your local health authorities may give instructions on checking your symptoms and reporting information. When to seek emergency medical attention Look for emergency warning signs* for COVID-19. If someone is showing any of these signs, seek emergency medical care immediately:  Trouble breathing  Persistent pain or pressure in the chest  New confusion  Inability to wake or stay awake  Pale, gray, or  blue-colored skin, lips, or nail beds, depending on skin tone *This list is not all possible symptoms. Please call your medical provider for any other symptoms that are severe or concerning to you. Call 911 or call ahead to your local emergency facility: Notify the operator that you are seeking care for someone who has or may have COVID-19. Call ahead before visiting your doctor  Call ahead. Many medical visits for routine care are being postponed or done by phone or telemedicine.  If you have a medical appointment that cannot be postponed, call your doctor's office, and tell them you have or may have COVID-19. This will help the office protect themselves and other patients. Get  tested  If you have symptoms of COVID-19, get tested. While waiting for test results, you stay away from others, including staying apart from those living in your household.  You can visit your state, tribal, local, and territorialhealth department's website to look for the latest local information on testing sites. If you are sick, wear a mask over your nose and mouth  You should wear a mask over your nose and mouth if you must be around  other people or animals, including pets (even at home).  You don't need to wear the mask if you are alone. If you can't put on a mask (because of trouble breathing, for example), cover your coughs and sneezes in some other way. Try to stay at least 6 feet away from other people. This will help protect the people around you.  Masks should not be placed on young children under age 24 years, anyone who has trouble breathing, or anyone who is not able to remove the mask without help. Note: During the COVID-19 pandemic, medical grade facemasks are reserved for healthcare workers and some first responders. Cover your coughs and sneezes  Cover your mouth and nose with a tissue when you cough or sneeze.  Throw away used tissues in a lined trash can.  Immediately wash your hands with soap  and water for at least 20 seconds. If soap and water are not available, clean your hands with an alcohol-based hand sanitizer that contains at least 60% alcohol. Clean your hands often  Wash your hands often with soap and water for at least 20 seconds. This is especially important after blowing your nose, coughing, or sneezing; going to the bathroom; and before eating or preparing food.  Use hand sanitizer if soap and water are not available. Use an alcohol-based hand sanitizer with at least 60% alcohol, covering all surfaces of your hands and rubbing them together until they feel dry.  Soap and water are the best option, especially if hands are visibly dirty.  Avoid touching your eyes, nose, and mouth with unwashed hands.  Handwashing Tips Avoid sharing personal household items  Do not share dishes, drinking glasses, cups, eating utensils, towels, or bedding with other people in your home.  Wash these items thoroughly after using them with soap and water or put in the dishwasher. Clean all "high-touch" surfaces everyday  Clean and disinfect high-touch surfaces in your "sick room" and bathroom; wear disposable gloves. Let someone else clean and disinfect surfaces in common areas, but you should clean your bedroom and bathroom, if possible.  If a caregiver or other person needs to clean and disinfect a sick person's bedroom or bathroom, they should do so on an as-needed basis. The caregiver/other person should wear a mask and disposable gloves prior to cleaning. They should wait as long as possible after the person who is sick has used the bathroom before coming in to clean and use the bathroom. ? High-touch surfaces include phones, remote controls, counters, tabletops, doorknobs, bathroom fixtures, toilets, keyboards, tablets, and bedside tables.  Clean and disinfect areas that may have blood, stool, or body fluids on them.  Use household cleaners and disinfectants. Clean the area or item  with soap and water or another detergent if it is dirty. Then, use a household disinfectant. ? Be sure to follow the instructions on the label to ensure safe and effective use of the product. Many products recommend keeping the surface wet for several minutes to ensure germs are killed. Many also recommend precautions such as wearing gloves and making sure you have good ventilation during use of the product. ? Use a product from [employer]: Disinfectants for Coronavirus (BMWUX-32). ? Complete Disinfection Guidance When you can be around others after being sick with COVID-19 Deciding when you can be around others is different for different situations. Find out when you can safely end home isolation. For any additional questions about your care, contact your healthcare provider or state or local  health department. 03/19/2019 Content source: [facility] ([facility]), Division of Viral Diseases This information is not intended to replace advice given to you by your health care provider. Make sure you discuss any questions you have with your health care provider. Document Revised: 11/03/2019 Document Reviewed: 11/03/2019 Elsevier Patient Education  2021 [employer].

## 2020-05-11 ENCOUNTER — Telehealth: Payer: Self-pay | Admitting: Oncology

## 2020-05-11 ENCOUNTER — Encounter: Payer: Self-pay | Admitting: Oncology

## 2020-05-11 ENCOUNTER — Ambulatory Visit: Payer: Medicare HMO | Admitting: Family Medicine

## 2020-05-11 ENCOUNTER — Telehealth: Payer: Self-pay | Admitting: *Deleted

## 2020-05-11 MED ORDER — MOLNUPIRAVIR EUA 200MG CAPSULE
4.0000 | ORAL_CAPSULE | Freq: Two times a day (BID) | ORAL | 0 refills | Status: AC
Start: 2020-05-11 — End: 2020-05-16

## 2020-05-11 NOTE — Telephone Encounter (Signed)
Pt's COVID questionnaire response triggered BPA; his shortness of breath, weakness, and cough are worse since he got up this morning; the pt says his shortness of breath is with activity; pt advised to seek evaluation with his PCP/ED/ or urgent care now; he verbalized understanding.

## 2020-05-11 NOTE — Telephone Encounter (Signed)
Outpatient Oral COVID Treatment Note  I connected with [name] on 05/11/2020/3:38 PM by telephone and verified that I am speaking with the correct person using two identifiers.  I discussed the limitations, risks, security, and privacy concerns of performing an evaluation and management service by telephone and the availability of in person appointments. I also discussed with the patient that there may be a patient responsible charge related to this service. The patient expressed understanding and agreed to proceed.  Patient location: Home Provider location: Clinic   Diagnosis: COVID-19 infection  Purpose of visit: Discussion of potential use of Molnupiravir or Paxlovid, a new treatment for mild to moderate COVID-19 viral infection in non-hospitalized patients.   Subjective: Patient is a 70 y.o. male who has been diagnosed with COVID 19 viral infection.  Their symptoms began on 05/07/20.   Past Medical History:  Diagnosis Date  . Arthritis   . Asthma   . Atrial fibrillation [facility])    Diagnosed December 2021  . Colon polyp   . Coronary atherosclerosis of native coronary artery    a. 2011: cath showing 90% stenosis along small non-dominant RCA (too small for PCI). b. 01/2018: cath showing nonobstructive CAD with 60 to 70% proximal to mid nondominant RCA stenosis, 50% mid LAD and 40 to 50% OM1  . DVT (deep venous thrombosis) ([place]) 2005   Right arm  . Essential hypertension   . Headache   . History of transfusion   . Hypothyroidism   . Mixed hyperlipidemia   . Morbid obesity ([place])   . MRSA (methicillin resistant staph aureus) culture positive    08/2012  . Narcolepsy   . OSA (obstructive sleep apnea)    CPAP  . Osteoarthritis   . Pneumonia   . Psoriasis   . Pulmonary embolism ([place]) 2004  . RLS (restless legs syndrome)   . Rotator cuff disorder    Left  . Septic arthritis of knee, left ([place])   . Skin cancer, basal cell   . Type 2 diabetes mellitus ([facility])     Allergies   Allergen Reactions  . Tape Rash    Pulls off skin  . Cardizem [Diltiazem Hcl]     Edema   . Cardura [Doxazosin Mesylate]     Headaches / cramps  . Lipitor [Atorvastatin] Other (See Comments)    Leg cramps  . Paxil [Paroxetine Hcl]     Unknown reaction   . Codeine Rash and Other (See Comments)    Headache   . Gabapentin Rash     Current Outpatient Medications:  .  acetaminophen (TYLENOL) 325 MG tablet, Take 2 tablets (650 mg total) by mouth every 6 (six) hours as needed for mild pain (or Fever >/= 101)., Disp: 30 tablet, Rfl: 1 .  albuterol (VENTOLIN HFA) 108 (90 Base) MCG/ACT inhaler, Inhale 2 puffs into the lungs every 6 (six) hours as needed for wheezing or shortness of breath., Disp: 18 g, Rfl: 5 .  amLODipine (NORVASC) 5 MG tablet, Take 1 tablet (5 mg total) by mouth daily., Disp: 30 tablet, Rfl: 5 .  apixaban (ELIQUIS) 5 MG TABS tablet, Take 1 tablet (5 mg total) by mouth 2 (two) times daily. For stroke prevention, Disp: 60 tablet, Rfl: 5 .  aspirin EC 81 MG tablet, Take 1 tablet (81 mg total) by mouth daily with breakfast. Swallow whole., Disp: 150 tablet, Rfl: 2 .  benzonatate (TESSALON) 100 MG capsule, Take 1 capsule (100 mg total) by mouth 2 (two) times daily  as needed for cough., Disp: 20 capsule, Rfl: 0 .  Continuous Blood Gluc Receiver ([place]) DEVI, 1 Piece by Does not apply route as needed., Disp: 1 Device, Rfl: 0 .  Continuous Blood Gluc Sensor (DEXCOM G6 SENSOR) MISC, 4 Pieces by Does not apply route once a week., Disp: 4 each, Rfl: 2 .  EUTHYROX 137 MCG tablet, TAKE 1 TABLET BY MOUTH ONCE DAILY BEFORE BREAKFAST, Disp: 90 tablet, Rfl: 0 .  fluticasone furoate-vilanterol (BREO ELLIPTA) 200-25 MCG/INH AEPB, Inhale 1 puff by mouth once daily, Disp: 180 each, Rfl: 3 .  Insulin Pen Needle (B-D ULTRAFINE III SHORT PEN) 31G X 8 MM MISC, USING THREE  TIMES DAILY., Disp: 100 each, Rfl: 5 .  insulin regular human CONCENTRATED (HUMULIN R U-500 KWIKPEN) 500 UNIT/ML  kwikpen, Inject 75-85 Units into the skin 3 (three) times daily with meals. Inject 85 units with breakfast and lunch, and 65 units with supper if glucose is above 90 and you are eating, Disp: 60 mL, Rfl: 2 .  isosorbide mononitrate (IMDUR) 60 MG 24 hr tablet, TAKE 1 TABLET BY MOUTH IN THE MORNING AND 1/2 (ONE-HALF) IN THE EVENING (Patient taking differently: Take 30-60 mg by mouth in the morning and at bedtime. 1 tablet in am, half a tablet in the evening), Disp: 135 tablet, Rfl: 3 .  loratadine (CLARITIN) 10 MG tablet, Take 10 mg by mouth daily as needed for allergies. , Disp: , Rfl:  .  losartan (COZAAR) 50 MG tablet, Take 1 tablet (50 mg total) by mouth daily., Disp: 90 tablet, Rfl: 0 .  metFORMIN (GLUCOPHAGE) 500 MG tablet, TAKE 1 TABLET BY MOUTH TWICE DAILY WITH MEALS, Disp: 180 tablet, Rfl: 0 .  metoprolol succinate (TOPROL XL) 25 MG 24 hr tablet, Take 1 tablet (25 mg total) by mouth See admin instructions. -Take 1 tablet (25 mg) along with an additional 50 mg tablet for total of 75 mg twice daily, Disp: 180 tablet, Rfl: 2 .  metoprolol succinate (TOPROL-XL) 50 MG 24 hr tablet, Take 1 tablet (50 mg total) by mouth 2 (two) times daily. -Take 1 tablet (50 mg) along with an additional 25 mg tablet for total of 75 mg twice daily, Disp: 180 tablet, Rfl: 3 .  modafinil (PROVIGIL) 200 MG tablet, Take 1 tablet (200 mg total) by mouth daily., Disp: 30 tablet, Rfl: 5 .  nitroGLYCERIN (NITROSTAT) 0.4 MG SL tablet, DISSOLVE 1 TABLET UNDER TONGUE EVERY 5 MINUTES UP TO 15 MIN FOR CHESTPAIN. IF NO RELIEF CALL 911., Disp: 25 tablet, Rfl: 3 .  ONE TOUCH ULTRA TEST test strip, TEST BLOOD SUGAR UP TO 4 TIMES DAILY., Disp: 150 each, Rfl: 5 .  ONETOUCH DELICA LANCETS 77O MISC, USE AS DIRECTED TO TEST BLOOD SUGAR 4 TIMES DAILY., Disp: 150 each, Rfl: 5 .  pravastatin (PRAVACHOL) 40 MG tablet, Take 1 tablet (40 mg total) by mouth every evening., Disp: 90 tablet, Rfl: 1 .  rOPINIRole (REQUIP XL) 4 MG 24 hr tablet, Take 1  tablet (4 mg total) by mouth at bedtime., Disp: 90 tablet, Rfl: 1 .  rOPINIRole (REQUIP) 4 MG tablet, Take 0.5 tablets (2 mg total) by mouth 2 (two) times daily., Disp: 90 tablet, Rfl: 1 .  Vitamin D, Ergocalciferol, (DRISDOL) 1.25 MG (50000 UNIT) CAPS capsule, Take 1 capsule by mouth once a week, Disp: 12 capsule, Rfl: 0  Objective: Patient sounds well.  They are in no apparent distress.  Breathing is non labored.  Mood and behavior are  normal.  Laboratory Data:  Recent Results (from the past 2160 hour(s))  Basic metabolic panel     Status: Abnormal   Collection Time: 02/18/20  5:10 PM  Result Value Ref Range   Sodium 136 135 - 145 mmol/L   Potassium 4.0 3.5 - 5.1 mmol/L   Chloride 106 98 - 111 mmol/L   CO2 23 22 - 32 mmol/L   Glucose, Bld 193 (H) 70 - 99 mg/dL    Comment: Glucose reference range applies only to samples taken after fasting for at least 8 hours.   BUN 31 (H) 8 - 23 mg/dL   Creatinine, Ser 1.63 (H) 0.61 - 1.24 mg/dL   Calcium 8.9 8.9 - 10.3 mg/dL   GFR, Estimated 46 (L) >60 mL/min    Comment: (NOTE) Calculated using the CKD-EPI Creatinine Equation (2021)    Anion gap 7 5 - 15    Comment: Performed at [facility], [address], [place], [place] [address]  CBC     Status: Abnormal   Collection Time: 02/18/20  5:10 PM  Result Value Ref Range   WBC 8.8 4.0 - 10.5 K/uL   RBC 4.28 4.22 - 5.81 MIL/uL   Hemoglobin 12.9 (L) 13.0 - 17.0 g/dL   HCT 39.1 39.0 - 52.0 %   MCV 91.4 80.0 - 100.0 fL   MCH 30.1 26.0 - 34.0 pg   MCHC 33.0 30.0 - 36.0 g/dL   RDW 13.9 11.5 - 15.5 %   Platelets 223 150 - 400 K/uL   nRBC 0.0 0.0 - 0.2 %    Comment: Performed at [facility], [address] [place], [place] [address]  Troponin I (High Sensitivity)     Status: None   Collection Time: 02/18/20  5:10 PM  Result Value Ref Range   Troponin I (High Sensitivity) 6 <18 ng/L    Comment: (NOTE) Elevated high sensitivity troponin I (hsTnI) values and significant  changes across serial  measurements may suggest ACS but many other  chronic and acute conditions are known to elevate hsTnI results.  Refer to the Links section for chest pain algorithms and additional  guidance. Performed at [facility], [address], [place], [place] [address]   Troponin I (High Sensitivity)     Status: None   Collection Time: 02/18/20  7:09 PM  Result Value Ref Range   Troponin I (High Sensitivity) 5 <18 ng/L    Comment: (NOTE) Elevated high sensitivity troponin I (hsTnI) values and significant  changes across serial measurements may suggest ACS but many other  chronic and acute conditions are known to elevate hsTnI results.  Refer to the "Links" section for chest pain algorithms and additional  guidance. Performed at [facility], [address] [place],  [address]   TSH     Status: None   Collection Time: 03/10/20  8:05 AM  Result Value Ref Range   TSH 1.310 0.450 - 4.500 uIU/mL  T4, free     Status: None   Collection Time: 03/10/20  8:05 AM  Result Value Ref Range   Free T4 1.57 0.82 - 1.77 ng/dL  Comprehensive metabolic panel     Status: Abnormal   Collection Time: 03/10/20  8:05 AM  Result Value Ref Range   Glucose 266 (H) 65 - 99 mg/dL   BUN 22 8 - 27 mg/dL   Creatinine, Ser 1.08 0.76 - 1.27 mg/dL   eGFR 75 >59 mL/min/1.73   BUN/Creatinine Ratio 20 10 - 24  Sodium 138 134 - 144 mmol/L   Potassium 4.6 3.5 - 5.2 mmol/L   Chloride 101 96 - 106 mmol/L   CO2 20 20 - 29 mmol/L   Calcium 9.4 8.6 - 10.2 mg/dL   Total Protein 6.6 6.0 - 8.5 g/dL   Albumin 4.3 3.8 - 4.8 g/dL   Globulin, Total 2.3 1.5 - 4.5 g/dL   Albumin/Globulin Ratio 1.9 1.2 - 2.2   Bilirubin Total 0.3 0.0 - 1.2 mg/dL   Alkaline Phosphatase 75 44 - 121 IU/L   AST 16 0 - 40 IU/L   ALT 14 0 - 44 IU/L  HgB A1c     Status: Abnormal   Collection Time: 03/18/20  9:40 AM  Result Value Ref Range   Hemoglobin A1C     HbA1c POC (<> result, manual entry)     HbA1c, POC (prediabetic range)     HbA1c, POC  (controlled diabetic range) 9.7 (A) 0.0 - 7.0 %     Assessment: 70 y.o. male with mild/moderate COVID 19 viral infection diagnosed on 05/09/20 at high risk for progression to severe COVID 19.  Plan:  This patient is a 70 y.o. male that meets the following criteria for Emergency Use Authorization of: Molnupiravir  1. Age >18 yr 2. SARS-COV-2 positive test 3. Symptom onset < 5 days 4. Mild-to-moderate COVID disease with high risk for severe progression to hospitalization or death   I have spoken and communicated the following to the patient or parent/caregiver regarding: 1. Molnupiravir is an unapproved drug that is authorized for use under an [employer].  2. There are no adequate, approved, available products for the treatment of COVID-19 in adults who have mild-to-moderate COVID-19 and are at high risk for progressing to severe COVID-19, including hospitalization or death. 3. Other therapeutics are currently authorized. For additional information on all products authorized for treatment or prevention of COVID-19, please see [email or web address].  4. There are benefits and risks of taking this treatment as outlined in the "Fact Sheet for Patients and Caregivers."  5. "Fact Sheet for Patients and Caregivers" was reviewed with patient. A hard copy will be provided to patient from pharmacy prior to the patient receiving treatment. 6. Patients should continue to self-isolate and use infection control measures (e.g., wear mask, isolate, social distance, avoid sharing personal items, clean and disinfect "high touch" surfaces, and frequent handwashing) according to CDC guidelines.  7. The patient or parent/caregiver has the option to accept or refuse treatment. 8. [place] has established a pregnancy surveillance program. 9. Females of childbearing potential should use a  reliable method of contraception correctly and consistently, as applicable, for the duration of treatment and for 4 days after the last dose of Molnupiravir. 27. Males of reproductive potential who are sexually active with females of childbearing potential should use a reliable method of contraception correctly and consistently during treatment and for at least 3 months after the last dose. 11. Pregnancy status and risk was assessed. Patient verbalized understanding of precautions.   After reviewing above information with the patient, the patient agrees to receive molnupiravir.  Follow up instructions:    . Take prescription BID x 5 days as directed . Reach out to pharmacist for counseling on medication if desired . For concerns regarding further COVID symptoms please follow up with your PCP or urgent care . For urgent or life-threatening issues, seek care at your local emergency department  The patient was provided an opportunity to ask questions, and  all were answered. The patient agreed with the plan and demonstrated an understanding of the instructions.   [facility] in [place].  The patient was advised to call their PCP or seek an in-person evaluation if the symptoms worsen or if the condition fails to improve as anticipated.   I provided 10 minutes of non face-to-face telephone visit time during this encounter, and > 50% was spent counseling as documented under my assessment & plan.  Jacquelin Hawking, NP 05/11/2020 /3:38 PM

## 2020-05-12 ENCOUNTER — Telehealth: Payer: Self-pay

## 2020-05-12 NOTE — Telephone Encounter (Signed)
Pt. Reported on questionnaire that he has increased weakness to legs. "I think it's from laying around so much." States his appetite is decreased. Staying hydrated. Concerned he may need a chest x-ray. Instructed to call his PCP or go to ED. Verbalizes understanding.

## 2020-05-18 DIAGNOSIS — G4733 Obstructive sleep apnea (adult) (pediatric): Secondary | ICD-10-CM | POA: Diagnosis not present

## 2020-05-21 ENCOUNTER — Ambulatory Visit: Payer: Medicare HMO | Admitting: Adult Health

## 2020-05-21 ENCOUNTER — Encounter: Payer: Self-pay | Admitting: Adult Health

## 2020-05-21 ENCOUNTER — Ambulatory Visit (INDEPENDENT_AMBULATORY_CARE_PROVIDER_SITE_OTHER): Payer: Medicare HMO

## 2020-05-21 ENCOUNTER — Other Ambulatory Visit: Payer: Self-pay | Admitting: Nurse Practitioner

## 2020-05-21 ENCOUNTER — Telehealth: Payer: Self-pay | Admitting: Adult Health

## 2020-05-21 ENCOUNTER — Other Ambulatory Visit: Payer: Self-pay

## 2020-05-21 VITALS — BP 150/84 | HR 71 | Temp 97.4°F | Ht 69.0 in | Wt 234.0 lb

## 2020-05-21 DIAGNOSIS — J4551 Severe persistent asthma with (acute) exacerbation: Secondary | ICD-10-CM | POA: Diagnosis not present

## 2020-05-21 DIAGNOSIS — I5189 Other ill-defined heart diseases: Secondary | ICD-10-CM

## 2020-05-21 DIAGNOSIS — J4550 Severe persistent asthma, uncomplicated: Secondary | ICD-10-CM | POA: Diagnosis not present

## 2020-05-21 DIAGNOSIS — U071 COVID-19: Secondary | ICD-10-CM | POA: Insufficient documentation

## 2020-05-21 DIAGNOSIS — J189 Pneumonia, unspecified organism: Secondary | ICD-10-CM | POA: Diagnosis not present

## 2020-05-21 DIAGNOSIS — J45909 Unspecified asthma, uncomplicated: Secondary | ICD-10-CM | POA: Diagnosis not present

## 2020-05-21 LAB — BASIC METABOLIC PANEL
BUN: 29 mg/dL — ABNORMAL HIGH (ref 6–23)
CO2: 24 mEq/L (ref 19–32)
Calcium: 9.4 mg/dL (ref 8.4–10.5)
Chloride: 103 mEq/L (ref 96–112)
Creatinine, Ser: 1.08 mg/dL (ref 0.40–1.50)
GFR: 70.31 mL/min (ref >60.00)
Glucose, Bld: 298 mg/dL — ABNORMAL HIGH (ref 70–99)
Potassium: 4.5 mEq/L (ref 3.5–5.1)
Sodium: 137 mEq/L (ref 135–145)

## 2020-05-21 LAB — BRAIN NATRIURETIC PEPTIDE: Pro B Natriuretic peptide (BNP): 100 pg/mL (ref 0.0–100.0)

## 2020-05-21 MED ORDER — PREDNISONE 20 MG PO TABS: 20.0000 mg | ORAL_TABLET | Freq: Every day | ORAL | 0 refills | Status: DC

## 2020-05-21 MED ORDER — BREO ELLIPTA 200-25 MCG/INH IN AEPB: 1.0000 | INHALATION_SPRAY | Freq: Every day | RESPIRATORY_TRACT | 0 refills | Status: DC

## 2020-05-21 MED ORDER — AZITHROMYCIN 250 MG PO TABS: ORAL_TABLET | ORAL | 0 refills | Status: AC

## 2020-05-21 NOTE — Assessment & Plan Note (Signed)
Acute exacerbation with recent COVID-19 infection. Check chest x-ray today. Will treat with a empiric course of antibiotics and brief course of low-dose steroids.  Patient does have underlying diabetes that is insulin-dependent.  He does have a Dexcom glucose monitor. Of asked him to monitor these closely.  If blood sugars are persistently elevated around 300 he is to call our office or his endocrinologist. He is on a sliding scale insulin.  Plan  Patient Instructions  Zpack take as directed.  Prednisone 20mg  daily for 5 days .  Mucinex DM Twice daily  As needed  Cough and congestion  Restart BREO 1 puff daily  Chest xray today .  Monitor Blood sugars closely , Sliding scale as directed  Call if BS >300 Albuterol inhaler As needed   Extra Lasix 40mg  daily for 2 days then resume normal dosing.  Labs today  Follow up with Dr. Halford Chessman  In 6-8 weeks and As needed   Please contact office for sooner follow up if symptoms do not improve or worsen or seek emergency care

## 2020-05-21 NOTE — Assessment & Plan Note (Signed)
Recently diagnosed COVID-19 infection.  Patient is almost 3 weeks post symptom onset.  He has completed a 5-day course of antiviral pack.  He has been vaccinated x2 for COVID-19.  Clinically appears stable.  Chest x-ray today.

## 2020-05-21 NOTE — Patient Instructions (Addendum)
Zpack take as directed.  Prednisone 20mg  daily for 5 days .  Mucinex DM Twice daily  As needed  Cough and congestion  Restart BREO 1 puff daily  Chest xray today .  Monitor Blood sugars closely , Sliding scale as directed  Call if BS >300 Albuterol inhaler As needed   Extra Lasix 40mg  daily for 2 days then resume normal dosing.  Labs today  Follow up with Dr. Halford Chessman  In 6-8 weeks and As needed   Please contact office for sooner follow up if symptoms do not improve or worsen or seek emergency care

## 2020-05-21 NOTE — Assessment & Plan Note (Signed)
Mild decompensation.  Patient does have increased weight gain and fluid retention.  Will give gentle diuresis.  Most recent renal function was stable.  We will check be met and BNP today.  Plan  Patient Instructions  Zpack take as directed.  Prednisone 51m daily for 5 days .  Mucinex DM Twice daily  As needed  Cough and congestion  Restart BREO 1 puff daily  Chest xray today .  Monitor Blood sugars closely , Sliding scale as directed  Call if BS >300 Albuterol inhaler As needed   Extra Lasix 44mdaily for 2 days then resume normal dosing.  Labs today  Follow up with Dr. SoHalford ChessmanIn 6-8 weeks and As needed   Please contact office for sooner follow up if symptoms do not improve or worsen or seek emergency care

## 2020-05-21 NOTE — Addendum Note (Signed)
Addended by: Birder Robson on: 05/21/2020 11:02 AM   Modules accepted: Orders

## 2020-05-21 NOTE — Progress Notes (Signed)
_0  ID: Paul Baker, male    DOB: 07-Mar-1950, 70 y.o.   MRN: [id number]  Chief Complaint  Patient presents with  . Follow-up    Referring provider: Erven Colla, DO  HPI: 70 year old male former smoker followed for severe persistent asthma  Medical history significant for obstructive sleep apnea and restless leg syndrome followed by [facility] neurology, history of coronary artery disease, fatty liver disease, right subclavian vein stenosis, spinal stenosis previous DVT right arm, psoriasis, septic arthritis in left knee and diabetes, A. fib on Eliquis   TEST/EVENTS :  Spirometry 10/17/11 >> 2.58 (89%), FEV1% 85 PFT 09/11/18 >> FEV1 2.19 (79%), FEV1% 82, TLC 4.48 (74%), DLCO 70%, +BD from change in FEF 25-75%  Chest imaging:  HRCT chest 12/19/18 >> atherosclerosis, patchy air trapping, fatty liver  Chest x-ray February 2022 no acute pulmonary disease  Sleep tests:  PSG 2012 >> AHI 60  Cardiac tests:  LHC 01/17/18 >> non obstructive CAD Echo 02/21/18 >> EF 55 to 60%  05/21/2020 Follow up: Asthma  Patient returns for a 1 year follow-up.  Patient has severe persistent asthma.  Says that he was doing okay up until the last few weeks.  He has been on Breo but has not been not been taking due to cost.  He is being evaluated for patient assistance.  Patient developed increased cough congestion around May 1.  Tested for COVID-19 at home on May 8.  He was given the antiviral pack- Molnupiravir x 5 days .  He had been vaccinated x2 for COVID-19.  Patient says he had ongoing cough congestion wheezing and shortness of breath.  He denies any hemoptysis, chest pain, orthopnea.  Has had increased leg swelling and weight has been up 6 pounds. Appetite is good with no nausea vomiting or diarrhea.   Allergies  Allergen Reactions  . Tape Rash    Pulls off skin  . Cardizem [Diltiazem Hcl]     Edema   . Cardura [Doxazosin Mesylate]     Headaches / cramps  . Lipitor [Atorvastatin]  Other (See Comments)    Leg cramps  . Paxil [Paroxetine Hcl]     Unknown reaction   . Codeine Rash and Other (See Comments)    Headache   . Gabapentin Rash    Immunization History  Administered Date(s) Administered  . Influenza Split 09/18/2012  . Influenza,inj,Quad PF,6+ Mos 10/13/2013, 10/06/2014, 09/11/2018  . Influenza-Unspecified 12/01/2015, 11/08/2016  . PFIZER(Purple Top)SARS-COV-2 Vaccination 03/06/2019, 03/27/2019  . Pneumococcal Polysaccharide-23 10/02/2004, 10/03/2011  . Td 05/24/2006    Past Medical History:  Diagnosis Date  . Arthritis   . Asthma   . Atrial fibrillation [facility])    Diagnosed December 2021  . Colon polyp   . Coronary atherosclerosis of native coronary artery    a. 2011: cath showing 90% stenosis along small non-dominant RCA (too small for PCI). b. 01/2018: cath showing nonobstructive CAD with 60 to 70% proximal to mid nondominant RCA stenosis, 50% mid LAD and 40 to 50% OM1  . DVT (deep venous thrombosis) ([place]) 2005   Right arm  . Essential hypertension   . Headache   . History of transfusion   . Hypothyroidism   . Mixed hyperlipidemia   . Morbid obesity ([place])   . MRSA (methicillin resistant staph aureus) culture positive    08/2012  . Narcolepsy   . OSA (obstructive sleep apnea)    CPAP  . Osteoarthritis   . Pneumonia   . Psoriasis   .  Pulmonary embolism ([place]) 2004  . RLS (restless legs syndrome)   . Rotator cuff disorder    Left  . Septic arthritis of knee, left ([place])   . Skin cancer, basal cell   . Type 2 diabetes mellitus ([facility])     Tobacco History: Social History   Tobacco Use  Smoking Status Former Smoker  . Packs/day: 1.50  . Years: 10.00  . Pack years: 15.00  . Types: Cigarettes  . Start date: 06/19/1967  . Quit date: 01/03/1995  . Years since quitting: 25.3  Smokeless Tobacco Never Used   Counseling given: Not Answered   Outpatient Medications Prior to Visit  Medication Sig Dispense Refill  . acetaminophen (TYLENOL)  325 MG tablet Take 2 tablets (650 mg total) by mouth every 6 (six) hours as needed for mild pain (or Fever >/= 101). 30 tablet 1  . albuterol (VENTOLIN HFA) 108 (90 Base) MCG/ACT inhaler Inhale 2 puffs into the lungs every 6 (six) hours as needed for wheezing or shortness of breath. 18 g 5  . amLODipine (NORVASC) 5 MG tablet Take 1 tablet (5 mg total) by mouth daily. 30 tablet 5  . apixaban (ELIQUIS) 5 MG TABS tablet Take 1 tablet (5 mg total) by mouth 2 (two) times daily. For stroke prevention 60 tablet 5  . aspirin EC 81 MG tablet Take 1 tablet (81 mg total) by mouth daily with breakfast. Swallow whole. 150 tablet 2  . benzonatate (TESSALON) 100 MG capsule Take 1 capsule (100 mg total) by mouth 2 (two) times daily as needed for cough. 20 capsule 0  . Continuous Blood Gluc Receiver (DEXCOM G6 RECEIVER) DEVI 1 Piece by Does not apply route as needed. 1 Device 0  . Continuous Blood Gluc Sensor (DEXCOM G6 SENSOR) MISC 4 Pieces by Does not apply route once a week. 4 each 2  . EUTHYROX 137 MCG tablet TAKE 1 TABLET BY MOUTH ONCE DAILY BEFORE BREAKFAST 90 tablet 0  . Insulin Pen Needle (B-D ULTRAFINE III SHORT PEN) 31G X 8 MM MISC USING THREE  TIMES DAILY. 100 each 5  . insulin regular human CONCENTRATED (HUMULIN R U-500 KWIKPEN) 500 UNIT/ML kwikpen Inject 75-85 Units into the skin 3 (three) times daily with meals. Inject 85 units with breakfast and lunch, and 65 units with supper if glucose is above 90 and you are eating 60 mL 2  . isosorbide mononitrate (IMDUR) 60 MG 24 hr tablet TAKE 1 TABLET BY MOUTH IN THE MORNING AND 1/2 (ONE-HALF) IN THE EVENING (Patient taking differently: Take 30-60 mg by mouth in the morning and at bedtime. 1 tablet in am, half a tablet in the evening) 135 tablet 3  . loratadine (CLARITIN) 10 MG tablet Take 10 mg by mouth daily as needed for allergies.     [name] losartan (COZAAR) 50 MG tablet Take 1 tablet (50 mg total) by mouth daily. 90 tablet 0  . metFORMIN (GLUCOPHAGE) 500 MG  tablet TAKE 1 TABLET BY MOUTH TWICE DAILY WITH MEALS 180 tablet 0  . metoprolol succinate (TOPROL XL) 25 MG 24 hr tablet Take 1 tablet (25 mg total) by mouth See admin instructions. -Take 1 tablet (25 mg) along with an additional 50 mg tablet for total of 75 mg twice daily 180 tablet 2  . metoprolol succinate (TOPROL-XL) 50 MG 24 hr tablet Take 1 tablet (50 mg total) by mouth 2 (two) times daily. -Take 1 tablet (50 mg) along with an additional 25 mg tablet for total of 75  mg twice daily 180 tablet 3  . modafinil (PROVIGIL) 200 MG tablet Take 1 tablet (200 mg total) by mouth daily. 30 tablet 5  . nitroGLYCERIN (NITROSTAT) 0.4 MG SL tablet DISSOLVE 1 TABLET UNDER TONGUE EVERY 5 MINUTES UP TO 15 MIN FOR CHESTPAIN. IF NO RELIEF CALL 911. 25 tablet 3  . ONE TOUCH ULTRA TEST test strip TEST BLOOD SUGAR UP TO 4 TIMES DAILY. 150 each 5  . ONETOUCH DELICA LANCETS 38V MISC USE AS DIRECTED TO TEST BLOOD SUGAR 4 TIMES DAILY. 150 each 5  . pravastatin (PRAVACHOL) 40 MG tablet Take 1 tablet (40 mg total) by mouth every evening. 90 tablet 1  . rOPINIRole (REQUIP XL) 4 MG 24 hr tablet Take 1 tablet (4 mg total) by mouth at bedtime. 90 tablet 1  . rOPINIRole (REQUIP) 4 MG tablet Take 0.5 tablets (2 mg total) by mouth 2 (two) times daily. 90 tablet 1  . Vitamin D, Ergocalciferol, (DRISDOL) 1.25 MG (50000 UNIT) CAPS capsule Take 1 capsule by mouth once a week 12 capsule 0  . fluticasone furoate-vilanterol (BREO ELLIPTA) 200-25 MCG/INH AEPB Inhale 1 puff by mouth once daily (Patient not taking: Reported on 05/21/2020) 180 each 3   No facility-administered medications prior to visit.     Review of Systems:   Constitutional:   No  weight loss, night sweats,  Fevers, chills,  +fatigue, or  lassitude.  HEENT:   No headaches,  Difficulty swallowing,  Tooth/dental problems, or  Sore throat,                No sneezing, itching, ear ache, + nasal congestion, post nasal drip,   CV:  No chest pain,  Orthopnea, PND,  +swelling in lower extremities, anasarca, dizziness, palpitations, syncope.   GI  No heartburn, indigestion, abdominal pain, nausea, vomiting, diarrhea, change in bowel habits, loss of appetite, bloody stools.   Resp:   No chest wall deformity  Skin: no rash or lesions.  GU: no dysuria, change in color of urine, no urgency or frequency.  No flank pain, no hematuria   MS:  No joint pain or swelling.  No decreased range of motion.  No back pain.    Physical Exam  BP (!) 150/84 (BP Location: Left Arm, Cuff Size: Normal)   Pulse 71   Temp (!) 97.4 F (36.3 C)   Ht _0  (1.753 m)   Wt 234 lb (106.1 kg)   SpO2 97%   BMI 34.56 kg/m   GEN: A/Ox3; pleasant , NAD, well nourished    HEENT:  [place]/AT,    NOSE-clear, THROAT-clear, no lesions, no postnasal drip or exudate noted.   NECK:  Supple w/ fair ROM; no JVD; normal carotid impulses w/o bruits; no thyromegaly or nodules palpated; no lymphadenopathy.    RESP few trace expiratory wheezes.  Speaks in full sentences.  no accessory muscle use, no dullness to percussion  CARD:  RRR, no m/r/g, 1-2+ peripheral edema, pulses intact, no cyanosis or clubbing.  GI:   Soft & nt; nml bowel sounds; no organomegaly or masses detected.   Musco: Warm bil, no deformities or joint swelling noted.   Neuro: alert, no focal deficits noted.    Skin: Warm, no lesions or rashes    Lab Results:  CBC    Component Value Date/Time   WBC 8.8 02/18/2020 1710   RBC 4.28 02/18/2020 1710   HGB 12.9 (L) 02/18/2020 1710   HGB 11.8 (L) 09/11/2018 0949   HCT 39.1  02/18/2020 1710   HCT 35.6 (L) 09/11/2018 0949   PLT 223 02/18/2020 1710   PLT 199 09/11/2018 0949   MCV 91.4 02/18/2020 1710   MCV 91 09/11/2018 0949   MCH 30.1 02/18/2020 1710   MCHC 33.0 02/18/2020 1710   RDW 13.9 02/18/2020 1710   RDW 13.8 09/11/2018 0949   LYMPHSABS 1.0 04/27/2019 1528   LYMPHSABS 1.2 09/11/2018 0949   MONOABS 0.7 04/27/2019 1528   EOSABS 0.2 04/27/2019 1528    EOSABS 0.3 09/11/2018 0949   BASOSABS 0.1 04/27/2019 1528   BASOSABS 0.1 09/11/2018 0949    BMET    Component Value Date/Time   NA 138 03/10/2020 0805   K 4.6 03/10/2020 0805   CL 101 03/10/2020 0805   CO2 20 03/10/2020 0805   GLUCOSE 266 (H) 03/10/2020 0805   GLUCOSE 193 (H) 02/18/2020 1710   BUN 22 03/10/2020 0805   CREATININE 1.08 03/10/2020 0805   CREATININE 1.11 02/06/2019 0813   CALCIUM 9.4 03/10/2020 0805   GFRNONAA 46 (L) 02/18/2020 1710   GFRNONAA 68 02/06/2019 0813   GFRAA 87 12/04/2019 0832   GFRAA 79 02/06/2019 0813    BNP    Component Value Date/Time   BNP 23.6 09/11/2018 0949    ProBNP    Component Value Date/Time   PROBNP 23.2 12/13/2012 1718    Imaging: No results found.    PFT Results Latest Ref Rng & Units 11/21/2018  FVC-Pre L 2.53  FVC-Predicted Pre % 68  FVC-Post L 2.68  FVC-Predicted Post % 72  Pre FEV1/FVC % % 81  Post FEV1/FCV % % 82  FEV1-Pre L 2.05  FEV1-Predicted Pre % 74  FEV1-Post L 2.19  DLCO uncorrected ml/min/mmHg 15.90  DLCO UNC% % 70  DLVA Predicted % 97  TLC L 4.48  TLC % Predicted % 74  RV % Predicted % 81    No results found for: NITRICOXIDE      Assessment & Plan:   Asthma Acute exacerbation with recent COVID-19 infection. Check chest x-ray today. Will treat with a empiric course of antibiotics and brief course of low-dose steroids.  Patient does have underlying diabetes that is insulin-dependent.  He does have a Dexcom glucose monitor. Of asked him to monitor these closely.  If blood sugars are persistently elevated around 300 he is to call our office or his endocrinologist. He is on a sliding scale insulin.  Plan  Patient Instructions  Zpack take as directed.  Prednisone 68m daily for 5 days .  Mucinex DM Twice daily  As needed  Cough and congestion  Restart BREO 1 puff daily  Chest xray today .  Monitor Blood sugars closely , Sliding scale as directed  Call if BS >300 Albuterol inhaler As  needed   Extra Lasix 424mdaily for 2 days then resume normal dosing.  Labs today  Follow up with Dr. SoHalford ChessmanIn 6-8 weeks and As needed   Please contact office for sooner follow up if symptoms do not improve or worsen or seek emergency care         COVID-19 virus infection Recently diagnosed COVID-19 infection.  Patient is almost 3 weeks post symptom onset.  He has completed a 5-day course of antiviral pack.  He has been vaccinated x2 for COVID-19.  Clinically appears stable.  Chest x-ray today.  Grade I diastolic dysfunction Mild decompensation.  Patient does have increased weight gain and fluid retention.  Will give gentle diuresis.  Most recent renal function  was stable.  We will check be met and BNP today.  Plan  Patient Instructions  Zpack take as directed.  Prednisone 35m daily for 5 days .  Mucinex DM Twice daily  As needed  Cough and congestion  Restart BREO 1 puff daily  Chest xray today .  Monitor Blood sugars closely , Sliding scale as directed  Call if BS >300 Albuterol inhaler As needed   Extra Lasix 453mdaily for 2 days then resume normal dosing.  Labs today  Follow up with Dr. SoHalford ChessmanIn 6-8 weeks and As needed   Please contact office for sooner follow up if symptoms do not improve or worsen or seek emergency care         I spent   42 minutes dedicated to the care of this patient on the date of this encounter to include pre-visit review of records, face-to-face time with the patient discussing conditions above, post visit ordering of testing, clinical documentation with the electronic health record, making appropriate referrals as documented, and communicating necessary findings to members of the patients care team.     TaRexene EdisonNP 05/21/2020

## 2020-05-21 NOTE — Progress Notes (Signed)
Spoke with the pt and notified of result- see phone note on 05/21/20

## 2020-05-21 NOTE — Telephone Encounter (Addendum)
Chest x-ray shows probable pneumonia on the right lung. This is most likely related to a COVID pneumonia Please have patient finish his antibiotics and steroids as discussed. Will need office visit in 2 weeks with a follow-up chest x-ray. Please advise patient if symptoms or not improving or worsen to contact us sooner or go to the emergency room  Please contact office for sooner follow up if symptoms do not improve or worsen or seek emergency care    Try to call to discuss results no answer left message to call back  I spoke with the pt and notified of results/recs per TP  He verbalized understanding  Appt set up and CXR ordered

## 2020-05-21 NOTE — Telephone Encounter (Signed)
Tried to call with labs and xray results no answer  Please let him know his results

## 2020-05-23 ENCOUNTER — Emergency Department (HOSPITAL_COMMUNITY)
Admission: EM | Admit: 2020-05-23 | Discharge: 2020-05-23 | Disposition: A | Discharge status: Home / Self Care | Payer: Medicare HMO | Attending: Emergency Medicine | Admitting: Emergency Medicine

## 2020-05-23 ENCOUNTER — Other Ambulatory Visit: Payer: Self-pay

## 2020-05-23 ENCOUNTER — Emergency Department (HOSPITAL_COMMUNITY): Payer: Medicare HMO

## 2020-05-23 ENCOUNTER — Encounter (HOSPITAL_COMMUNITY): Payer: Self-pay | Admitting: Emergency Medicine

## 2020-05-23 DIAGNOSIS — Z85828 Personal history of other malignant neoplasm of skin: Secondary | ICD-10-CM | POA: Insufficient documentation

## 2020-05-23 DIAGNOSIS — Z794 Long term (current) use of insulin: Secondary | ICD-10-CM | POA: Diagnosis not present

## 2020-05-23 DIAGNOSIS — U071 COVID-19: Secondary | ICD-10-CM | POA: Insufficient documentation

## 2020-05-23 DIAGNOSIS — I10 Essential (primary) hypertension: Secondary | ICD-10-CM | POA: Diagnosis not present

## 2020-05-23 DIAGNOSIS — Z87891 Personal history of nicotine dependence: Secondary | ICD-10-CM | POA: Insufficient documentation

## 2020-05-23 DIAGNOSIS — J45909 Unspecified asthma, uncomplicated: Secondary | ICD-10-CM | POA: Diagnosis not present

## 2020-05-23 DIAGNOSIS — Z96653 Presence of artificial knee joint, bilateral: Secondary | ICD-10-CM | POA: Diagnosis not present

## 2020-05-23 DIAGNOSIS — E039 Hypothyroidism, unspecified: Secondary | ICD-10-CM | POA: Insufficient documentation

## 2020-05-23 DIAGNOSIS — R0600 Dyspnea, unspecified: Secondary | ICD-10-CM

## 2020-05-23 DIAGNOSIS — Z7982 Long term (current) use of aspirin: Secondary | ICD-10-CM | POA: Diagnosis not present

## 2020-05-23 DIAGNOSIS — R0602 Shortness of breath: Secondary | ICD-10-CM | POA: Diagnosis not present

## 2020-05-23 DIAGNOSIS — Z79899 Other long term (current) drug therapy: Secondary | ICD-10-CM | POA: Diagnosis not present

## 2020-05-23 DIAGNOSIS — Z7984 Long term (current) use of oral hypoglycemic drugs: Secondary | ICD-10-CM | POA: Insufficient documentation

## 2020-05-23 DIAGNOSIS — I2510 Atherosclerotic heart disease of native coronary artery without angina pectoris: Secondary | ICD-10-CM | POA: Diagnosis not present

## 2020-05-23 DIAGNOSIS — Z7901 Long term (current) use of anticoagulants: Secondary | ICD-10-CM | POA: Diagnosis not present

## 2020-05-23 DIAGNOSIS — E1140 Type 2 diabetes mellitus with diabetic neuropathy, unspecified: Secondary | ICD-10-CM | POA: Insufficient documentation

## 2020-05-23 LAB — CBC WITH DIFFERENTIAL/PLATELET
Abs Immature Granulocytes: 0.13 10*3/uL — ABNORMAL HIGH (ref 0.00–0.07)
Basophils Absolute: 0 10*3/uL (ref 0.0–0.1)
Basophils Relative: 0 %
Eosinophils Absolute: 0 10*3/uL (ref 0.0–0.5)
Eosinophils Relative: 0 %
HCT: 36.4 % — ABNORMAL LOW (ref 39.0–52.0)
Hemoglobin: 11.8 g/dL — ABNORMAL LOW (ref 13.0–17.0)
Immature Granulocytes: 1 %
Lymphocytes Relative: 13 %
Lymphs Abs: 1.4 10*3/uL (ref 0.7–4.0)
MCH: 29.9 pg (ref 26.0–34.0)
MCHC: 32.4 g/dL (ref 30.0–36.0)
MCV: 92.2 fL (ref 80.0–100.0)
Monocytes Absolute: 0.9 10*3/uL (ref 0.1–1.0)
Monocytes Relative: 9 %
Neutro Abs: 7.6 10*3/uL (ref 1.7–7.7)
Neutrophils Relative %: 77 %
Platelets: 274 10*3/uL (ref 150–400)
RBC: 3.95 MIL/uL — ABNORMAL LOW (ref 4.22–5.81)
RDW: 14 % (ref 11.5–15.5)
WBC: 10 10*3/uL (ref 4.0–10.5)
nRBC: 0 % (ref 0.0–0.2)

## 2020-05-23 LAB — COMPREHENSIVE METABOLIC PANEL
ALT: 23 U/L (ref 0–44)
AST: 23 U/L (ref 15–41)
Albumin: 3.8 g/dL (ref 3.5–5.0)
Alkaline Phosphatase: 50 U/L (ref 38–126)
Anion gap: 10 (ref 5–15)
BUN: 37 mg/dL — ABNORMAL HIGH (ref 8–23)
CO2: 24 mmol/L (ref 22–32)
Calcium: 9.3 mg/dL (ref 8.9–10.3)
Chloride: 103 mmol/L (ref 98–111)
Creatinine, Ser: 1.43 mg/dL — ABNORMAL HIGH (ref 0.61–1.24)
GFR, Estimated: 53 mL/min — ABNORMAL LOW (ref >60)
Glucose, Bld: 196 mg/dL — ABNORMAL HIGH (ref 70–99)
Potassium: 3.8 mmol/L (ref 3.5–5.1)
Sodium: 137 mmol/L (ref 135–145)
Total Bilirubin: 0.4 mg/dL (ref 0.3–1.2)
Total Protein: 6.9 g/dL (ref 6.5–8.1)

## 2020-05-23 LAB — TROPONIN I (HIGH SENSITIVITY): Troponin I (High Sensitivity): 5 ng/L (ref <18)

## 2020-05-23 LAB — BRAIN NATRIURETIC PEPTIDE: B Natriuretic Peptide: 56 pg/mL (ref 0.0–100.0)

## 2020-05-23 MED ORDER — IOHEXOL 350 MG/ML SOLN
100.0000 mL | Freq: Once | INTRAVENOUS | Status: AC | PRN
  Administered 2020-05-23: 100 mL via INTRAVENOUS

## 2020-05-23 MED ORDER — IOHEXOL 350 MG/ML SOLN: 75.0000 mL | Freq: Once | INTRAVENOUS | Status: DC | PRN

## 2020-05-23 NOTE — Discharge Instructions (Addendum)
Make sure you use your inhaler every 4 hours while you are awake to help with your shortness of breath

## 2020-05-23 NOTE — ED Notes (Signed)
Arrived back to room from CT

## 2020-05-23 NOTE — ED Notes (Signed)
Patient transported to CT 

## 2020-05-23 NOTE — ED Notes (Signed)
ED Provider at bedside. 

## 2020-05-23 NOTE — ED Triage Notes (Signed)
Pt reports increased SHOB. Pt states he was dx with COVID 3 weeks ago. Pt had chest x-ray on 5/20 and was told he had pneumonia.

## 2020-05-23 NOTE — ED Provider Notes (Signed)
[facility] EMERGENCY DEPARTMENT Provider Note   CSN: [id number] Arrival date & time: 05/23/20  2058     History Chief Complaint  Patient presents with  . Shortness of Breath    Paul Baker is a 70 y.o. male.  Patient has COVID recently and has been short winded..  No fever no chills   The history is provided by the patient and medical records.  Shortness of Breath Severity:  Mild Onset quality:  Gradual Timing:  Constant Progression:  Waxing and waning Chronicity:  Recurrent Context: activity   Relieved by:  Nothing Worsened by:  Nothing Associated symptoms: no abdominal pain, no chest pain, no cough, no headaches and no rash        Past Medical History:  Diagnosis Date  . Arthritis   . Asthma   . Atrial fibrillation [facility])    Diagnosed December 2021  . Colon polyp   . Coronary atherosclerosis of native coronary artery    a. 2011: cath showing 90% stenosis along small non-dominant RCA (too small for PCI). b. 01/2018: cath showing nonobstructive CAD with 60 to 70% proximal to mid nondominant RCA stenosis, 50% mid LAD and 40 to 50% OM1  . DVT (deep venous thrombosis) ([place]) 2005   Right arm  . Essential hypertension   . Headache   . History of transfusion   . Hypothyroidism   . Mixed hyperlipidemia   . Morbid obesity ([place])   . MRSA (methicillin resistant staph aureus) culture positive    08/2012  . Narcolepsy   . OSA (obstructive sleep apnea)    CPAP  . Osteoarthritis   . Pneumonia   . Psoriasis   . Pulmonary embolism ([place]) 2004  . RLS (restless legs syndrome)   . Rotator cuff disorder    Left  . Septic arthritis of knee, left ([place])   . Skin cancer, basal cell   . Type 2 diabetes mellitus [facility])     Patient Active Problem List   Diagnosis Date Noted  . COVID-19 virus infection 05/21/2020  . Acute coronary syndrome without high troponin ([place]) 12/05/2019  . Grade I diastolic dysfunction 50/38/8828  . Type 2 diabetes mellitus ([place])   .  Paroxysmal atrial fibrillation (H[facility])   . Hypothyroidism   . Acute bacterial rhinosinusitis 11/18/2019  . Cough 11/18/2019  . Paresthesia of skin 08/05/2019  . Asthma 08/05/2019  . Class 2 obesity 08/05/2019  . Right sided weakness 08/05/2019  . Vertigo 08/04/2019  . Dizziness 02/18/2019  . Type 2 diabetes mellitus with hypoglycemia without coma ([place]) 03/31/2017  . AKI (acute kidney injury) ([place]) 03/30/2017  . Right patellar tendon rupture 12/04/2014  . Mixed hyperlipidemia 11/25/2014  . Other specified hypothyroidism 11/25/2014  . S/P right TKA 03/23/2014  . S/P knee replacement 03/23/2014  . Spinal stenosis of cervicothoracic region 10/08/2013  . RLS (restless legs syndrome) 10/08/2013  . DM type 2 causing vascular disease ([place]) 10/08/2013  . Preoperative cardiovascular examination 08/14/2013  . Sleep apnea with use of continuous positive airway pressure (CPAP) 04/02/2013  . 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/02/2013  . Diabetic neuropathy ([place]) 07/23/2012  . Obesity, morbid ([place]) 05/01/2012  . Hypersomnia, persistent 05/01/2012  . OSA on CPAP 04/19/2012  . Chest pain 04/19/2012  . Essential hypertension, benign 12/16/2009  . CORONARY ATHEROSCLEROSIS NATIVE CORONARY ARTERY 12/16/2009  . Hyperglycemia due to type 2 diabetes mellitus ([place]) 11/30/2009  . Accelerating angina ([place]) 11/30/2009    Past Surgical History:  Procedure Laterality Date  .  BACK SURGERY    . CATARACT EXTRACTION Bilateral   . COLONOSCOPY    . CYST REMOVAL TRUNK     from back  . EYE SURGERY Left 2016   laser to left eye  . HIP SURGERY     bone removed from both sides of hip  . KNEE ARTHROTOMY Right 12/04/2014   Procedure: KNEE ARTHROTOMY PATELLA LIGAMENT RECONSTRUSION AND REPAIR RIGHT KNEE;  Surgeon: Paralee Cancel, MD;  Location: [place];  Service: Orthopedics;  Laterality: Right;  . KNEE SURGERY     X 25 TIMES  . LEFT HEART CATH AND CORONARY ANGIOGRAPHY N/A 01/17/2018   Procedure: LEFT  HEART CATH AND CORONARY ANGIOGRAPHY;  Surgeon: Belva Crome, MD;  Location: [place] [facility];  Service: Cardiovascular;  Laterality: N/A;  . LUMBAR DISC SURGERY     Left L3, L4, L5 discecotomy with decompression of L4 root  . TONSILLECTOMY    . TOTAL KNEE ARTHROPLASTY  2003   LEFT  . TOTAL KNEE ARTHROPLASTY Right 03/23/2014   Procedure: RIGHT TOTAL KNEE ARTHROPLASTY AND REMOVAL RIGHT TIBIAL  DEEP IMPLANT STAPLE;  Surgeon: Paralee Cancel, MD;  Location: [facility];  Service: Orthopedics;  Laterality: Right;  . TOTAL KNEE REVISION  2005   LEFT  . WRIST SURGERY         Family History  Problem Relation Age of Onset  . Hypertension Father   . Heart attack Father   . Kidney Stones Father   . Seizures Grandchild   . Narcolepsy Grandchild   . Diabetes Sister     Social History   Tobacco Use  . Smoking status: Former Smoker    Packs/day: 1.50    Years: 10.00    Pack years: 15.00    Types: Cigarettes    Start date: 06/19/1967    Quit date: 01/03/1995    Years since quitting: 25.4  . Smokeless tobacco: Never Used  Vaping Use  . Vaping Use: Never used  Substance Use Topics  . Alcohol use: No    Alcohol/week: 0.0 standard drinks    Comment: quit drinking in 07/86  . Drug use: No    Home Medications Prior to Admission medications   Medication Sig Start Date End Date Taking? Authorizing Provider  acetaminophen (TYLENOL) 325 MG tablet Take 2 tablets (650 mg total) by mouth every 6 (six) hours as needed for mild pain (or Fever >/= 101). 12/05/19   Roxan Hockey, MD  albuterol (VENTOLIN HFA) 108 (90 Base) MCG/ACT inhaler Inhale 2 puffs into the lungs every 6 (six) hours as needed for wheezing or shortness of breath. 12/12/18   [name], MD  amLODipine (NORVASC) 5 MG tablet Take 1 tablet (5 mg total) by mouth daily. 12/05/19   Roxan Hockey, MD  apixaban (ELIQUIS) 5 MG TABS tablet Take 1 tablet (5 mg total) by mouth 2 (two) times daily. For stroke prevention 12/05/19   Roxan Hockey, MD  aspirin EC 81 MG tablet Take 1 tablet (81 mg total) by mouth daily with breakfast. Swallow whole. 12/05/19 12/04/20  Roxan Hockey, MD  azithromycin (ZITHROMAX Z-PAK) 250 MG tablet Take 2 tablets (500 mg) on  Day 1,  followed by 1 tablet (250 mg) once daily on Days 2 through 5. 05/21/20 05/26/20  Parrett, Fonnie Mu, NP  benzonatate (TESSALON) 100 MG capsule Take 1 capsule (100 mg total) by mouth 2 (two) times daily as needed for cough. 05/10/20   Mar Daring, PA-C  Continuous Blood Gluc Receiver (  DEXCOM G6 RECEIVER) DEVI 1 Piece by Does not apply route as needed. 04/13/18   Roma KayserNida, Gebreselassie W, MD  Continuous Blood Gluc Sensor (DEXCOM G6 SENSOR) MISC 4 Pieces by Does not apply route once a week. 05/15/18   Roma KayserNida, Gebreselassie W, MD  EUTHYROX 137 MCG tablet TAKE 1 TABLET BY MOUTH ONCE DAILY BEFORE BREAKFAST 02/18/20   Dani Gobbleeardon, Whitney J, NP  fluticasone furoate-vilanterol (BREO ELLIPTA) 200-25 MCG/INH AEPB Inhale 1 puff by mouth once daily Patient not taking: Reported on 05/21/2020 05/05/20   Coralyn HellingSood, Vineet, MD  fluticasone furoate-vilanterol (BREO ELLIPTA) 200-25 MCG/INH AEPB Inhale 1 puff into the lungs daily. 05/21/20   Parrett, Virgel Bouquetammy S, NP  Insulin Pen Needle (B-D ULTRAFINE III SHORT PEN) 31G X 8 MM MISC USING THREE  TIMES DAILY. 01/08/20   Dani Gobbleeardon, Whitney J, NP  insulin regular human CONCENTRATED (HUMULIN R U-500 KWIKPEN) 500 UNIT/ML kwikpen Inject 75-85 Units into the skin 3 (three) times daily with meals. Inject 85 units with breakfast and lunch, and 65 units with supper if glucose is above 90 and you are eating 03/18/20   Dani Gobbleeardon, Whitney J, NP  isosorbide mononitrate (IMDUR) 60 MG 24 hr tablet TAKE 1 TABLET BY MOUTH IN THE MORNING AND 1/2 (ONE-HALF) IN THE EVENING Patient taking differently: Take 30-60 mg by mouth in the morning and at bedtime. 1 tablet in am, half a tablet in the evening 06/30/19   Jonelle SidleMcDowell, Samuel G, MD  loratadine (CLARITIN) 10 MG tablet Take 10 mg by mouth daily as  needed for allergies.     [provider]  losartan (COZAAR) 50 MG tablet Take 1 tablet (50 mg total) by mouth daily. 07/21/19   Ladona Ridgelaylor, Malena M, DO  metFORMIN (GLUCOPHAGE) 500 MG tablet TAKE 1 TABLET BY MOUTH TWICE DAILY WITH MEALS 04/21/20   Nida, Denman GeorgeGebreselassie W, MD  metoprolol succinate (TOPROL XL) 25 MG 24 hr tablet Take 1 tablet (25 mg total) by mouth See admin instructions. -Take 1 tablet (25 mg) along with an additional 50 mg tablet for total of 75 mg twice daily 12/05/19 12/04/20  Shon HaleEmokpae, Courage, MD  metoprolol succinate (TOPROL-XL) 50 MG 24 hr tablet Take 1 tablet (50 mg total) by mouth 2 (two) times daily. -Take 1 tablet (50 mg) along with an additional 25 mg tablet for total of 75 mg twice daily 12/05/19   Shon HaleEmokpae, Courage, MD  modafinil (PROVIGIL) 200 MG tablet Take 1 tablet (200 mg total) by mouth daily. 04/29/20   Lomax, Amy, NP  nitroGLYCERIN (NITROSTAT) 0.4 MG SL tablet DISSOLVE 1 TABLET UNDER TONGUE EVERY 5 MINUTES UP TO 15 MIN FOR CHESTPAIN. IF NO RELIEF CALL 911. 04/05/20   Jonelle SidleMcDowell, Samuel G, MD  ONE TOUCH ULTRA TEST test strip TEST BLOOD SUGAR UP TO 4 TIMES DAILY. 10/10/16   Roma KayserNida, Gebreselassie W, MD  ONETOUCH DELICA LANCETS 33G MISC USE AS DIRECTED TO TEST BLOOD SUGAR 4 TIMES DAILY. 10/10/16   Roma KayserNida, Gebreselassie W, MD  pravastatin (PRAVACHOL) 40 MG tablet Take 1 tablet (40 mg total) by mouth every evening. 04/19/20   Jonelle SidleMcDowell, Samuel G, MD  predniSONE (DELTASONE) 20 MG tablet Take 1 tablet (20 mg total) by mouth daily with breakfast. 05/21/20   Parrett, Virgel Bouquetammy S, NP  rOPINIRole (REQUIP XL) 4 MG 24 hr tablet Take 1 tablet (4 mg total) by mouth at bedtime. 12/10/19   Lomax, Amy, NP  rOPINIRole (REQUIP) 4 MG tablet Take 0.5 tablets (2 mg total) by mouth 2 (two) times daily. 01/15/20  Lomax, Amy, NP  Vitamin D, Ergocalciferol, (DRISDOL) 1.25 MG (50000 UNIT) CAPS capsule Take 1 capsule by mouth once a week 11/11/19   Cassandria Anger, MD    Allergies    Tape, Cardizem [diltiazem  hcl], Cardura [doxazosin mesylate], Lipitor [atorvastatin], Paxil [paroxetine hcl], Codeine, and Gabapentin  Review of Systems   Review of Systems  Constitutional: Negative for appetite change and fatigue.  HENT: Negative for congestion, ear discharge and sinus pressure.   Eyes: Negative for discharge.  Respiratory: Positive for shortness of breath. Negative for cough.   Cardiovascular: Negative for chest pain.  Gastrointestinal: Negative for abdominal pain and diarrhea.  Genitourinary: Negative for frequency and hematuria.  Musculoskeletal: Negative for back pain.  Skin: Negative for rash.  Neurological: Negative for seizures and headaches.  Psychiatric/Behavioral: Negative for hallucinations.    Physical Exam Updated Vital Signs BP 123/66   Pulse 62   Temp 98.2 F (36.8 C) (Oral)   Resp (!) 24   Wt 101.6 kg   SpO2 95%   BMI 33.08 kg/m   Physical Exam Vitals and nursing note reviewed.  Constitutional:      Appearance: He is well-developed.  HENT:     Head: Normocephalic.     Nose: Nose normal.  Eyes:     General: No scleral icterus.    Conjunctiva/sclera: Conjunctivae normal.  Neck:     Thyroid: No thyromegaly.  Cardiovascular:     Rate and Rhythm: Normal rate and regular rhythm.     Heart sounds: No murmur heard. No friction rub. No gallop.   Pulmonary:     Breath sounds: No stridor. No wheezing or rales.  Chest:     Chest wall: No tenderness.  Abdominal:     General: There is no distension.     Tenderness: There is no abdominal tenderness. There is no rebound.  Musculoskeletal:        General: Normal range of motion.     Cervical back: Neck supple.  Lymphadenopathy:     Cervical: No cervical adenopathy.  Skin:    Findings: No erythema or rash.  Neurological:     Mental Status: He is alert and oriented to person, place, and time.     Motor: No abnormal muscle tone.     Coordination: Coordination normal.  Psychiatric:        Behavior: Behavior normal.      ED Results / Procedures / Treatments   Labs (all labs ordered are listed, but only abnormal results are displayed) Labs Reviewed  CBC WITH DIFFERENTIAL/PLATELET - Abnormal; Notable for the following components:      Result Value   RBC 3.95 (*)    Hemoglobin 11.8 (*)    HCT 36.4 (*)    Abs Immature Granulocytes 0.13 (*)    All other components within normal limits  COMPREHENSIVE METABOLIC PANEL - Abnormal; Notable for the following components:   Glucose, Bld 196 (*)    BUN 37 (*)    Creatinine, Ser 1.43 (*)    GFR, Estimated 53 (*)    All other components within normal limits  BRAIN NATRIURETIC PEPTIDE  TROPONIN I (HIGH SENSITIVITY)  TROPONIN I (HIGH SENSITIVITY)    EKG None  Radiology CT Angio Chest PE W and/or Wo Contrast  Result Date: 05/23/2020 CLINICAL DATA:  Subacute COVID infection, progressive dyspnea EXAM: CT ANGIOGRAPHY CHEST WITH CONTRAST TECHNIQUE: Multidetector CT imaging of the chest was performed using the standard protocol during bolus administration of intravenous contrast. Multiplanar CT  image reconstructions and MIPs were obtained to evaluate the vascular anatomy. CONTRAST:  164mL OMNIPAQUE IOHEXOL 350 MG/ML SOLN COMPARISON:  12/19/2018 FINDINGS: Cardiovascular: Adequate opacification of the pulmonary arterial tree. No intraluminal filling defect identified to suggest acute pulmonary embolism. Mild coronary artery calcification. Global cardiac size within normal limits. No pericardial effusion. Central pulmonary arteries are of normal caliber. Mild atherosclerotic calcification noted within the thoracic aorta. No aortic aneurysm. Mediastinum/Nodes: No enlarged mediastinal, hilar, or axillary lymph nodes. Thyroid gland, trachea, and esophagus demonstrate no significant findings. Lungs/Pleura: Lungs are clear. No pleural effusion or pneumothorax. Upper Abdomen: No acute abnormality. Musculoskeletal: Osseous structures are age-appropriate. No acute bone abnormality.  Review of the MIP images confirms the above findings. IMPRESSION: No pulmonary embolism.  No acute intrathoracic pathology identified. Mild coronary artery calcification. Aortic Atherosclerosis (ICD10-I70.0). Electronically Signed   By: Fidela Salisbury MD   On: 05/23/2020 23:20    Procedures Procedures   Medications Ordered in ED Medications  iohexol (OMNIPAQUE) 350 MG/ML injection 100 mL (100 mLs Intravenous Contrast Given 05/23/20 2250)    ED Course  I have reviewed the triage vital signs and the nursing notes.  Pertinent labs & imaging results that were available during my care of the patient were reviewed by me and considered in my medical decision making (see chart for details). Patient has been having some shortness of breath which is most likely related to COVID.  He is on prednisone and Zithromax but has not been using his inhaler.  Patient is not hypoxic here and his CT angio was negative   MDM Rules/Calculators/A&P                          Patient with COVID-19 and shortness of breath but no hypoxia.  He was told to start back using his inhaler every 4 hours while awake and follow-up with his doctor Final Clinical Impression(s) / ED Diagnoses Final diagnoses:  Dyspnea, unspecified type    Rx / DC Orders ED Discharge Orders    None       Milton Ferguson, MD 05/25/20 1004

## 2020-05-24 DIAGNOSIS — E1110 Type 2 diabetes mellitus with ketoacidosis without coma: Secondary | ICD-10-CM | POA: Diagnosis not present

## 2020-05-24 NOTE — Progress Notes (Signed)
Reviewed and agree with assessment/plan.   Tykiera Raven, MD [facility] 05/24/2020, 8:38 AM Pager:  [phone]  

## 2020-05-25 NOTE — Patient Instructions (Addendum)
Please continue using your CPAP regularly. While your insurance requires that you use CPAP at least 4 hours each night on 70% of the nights, I recommend, that you not skip any nights and use it throughout the night if you can. Getting used to CPAP and staying with the treatment long term does take time and patience and discipline. Untreated obstructive sleep apnea when it is moderate to severe can have an adverse impact on cardiovascular health and raise her risk for heart disease, arrhythmias, hypertension, congestive heart failure, stroke and diabetes. Untreated obstructive sleep apnea causes sleep disruption, nonrestorative sleep, and sleep deprivation. This can have an impact on your day to day functioning and cause daytime sleepiness and impairment of cognitive function, memory loss, mood disturbance, and problems focussing. Using CPAP regularly can improve these symptoms.  Please keep a close eye on your blood pressure. Modafinil can cause BP to be a little higher than normal. If readings are consistently greater than 140/90, please call me.   Continue ropinirole XL 4mg  twice daily and ropinirole IR 2mg  twice daily  Continue working on CPAP compliance. I am proud of you for working so hard to improve 4 hour compliance. I am going to increase your pressure a little bit to see if we can get the apneic events a little lower.   Follow up with me in 3 months    Sleep Apnea Sleep apnea affects breathing during sleep. It causes breathing to stop for a short time or to become shallow. It can also increase the risk of:  Heart attack.  Stroke.  Being very overweight (obese).  Diabetes.  Heart failure.  Irregular heartbeat. The goal of treatment is to help you breathe normally again. What are the causes? There are three kinds of sleep apnea:  Obstructive sleep apnea. This is caused by a blocked or collapsed airway.  Central sleep apnea. This happens when the brain does not send the right  signals to the muscles that control breathing.  Mixed sleep apnea. This is a combination of obstructive and central sleep apnea. The most common cause of this condition is a collapsed or blocked airway. This can happen if:  Your throat muscles are too relaxed.  Your tongue and tonsils are too large.  You are overweight.  Your airway is too small.   What increases the risk?  Being overweight.  Smoking.  Having a small airway.  Being older.  Being male.  Drinking alcohol.  Taking medicines to calm yourself (sedatives or tranquilizers).  Having family members with the condition. What are the signs or symptoms?  Trouble staying asleep.  Being sleepy or tired during the day.  Getting angry a lot.  Loud snoring.  Headaches in the morning.  Not being able to focus your mind (concentrate).  Forgetting things.  Less interest in sex.  Mood swings.  Personality changes.  Feelings of sadness (depression).  Waking up a lot during the night to pee (urinate).  Dry mouth.  Sore throat. How is this diagnosed?  Your medical history.  A physical exam.  A test that is done when you are sleeping (sleep study). The test is most often done in a sleep lab but may also be done at home. How is this treated?  Sleeping on your side.  Using a medicine to get rid of mucus in your nose (decongestant).  Avoiding the use of alcohol, medicines to help you relax, or certain pain medicines (narcotics).  Losing weight, if needed.  Changing  your diet.  Not smoking.  Using a machine to open your airway while you sleep, such as: ? An oral appliance. This is a mouthpiece that shifts your lower jaw forward. ? A CPAP device. This device blows air through a mask when you breathe out (exhale). ? An EPAP device. This has valves that you put in each nostril. ? A BPAP device. This device blows air through a mask when you breathe in (inhale) and breathe out.  Having surgery if  other treatments do not work. It is important to get treatment for sleep apnea. Without treatment, it can lead to:  High blood pressure.  Coronary artery disease.  In men, not being able to have an erection (impotence).  Reduced thinking ability.   Follow these instructions at home: Lifestyle  Make changes that your doctor recommends.  Eat a healthy diet.  Lose weight if needed.  Avoid alcohol, medicines to help you relax, and some pain medicines.  Do not use any products that contain nicotine or tobacco, such as cigarettes, e-cigarettes, and chewing tobacco. If you need help quitting, ask your doctor. General instructions  Take over-the-counter and prescription medicines only as told by your doctor.  If you were given a machine to use while you sleep, use it only as told by your doctor.  If you are having surgery, make sure to tell your doctor you have sleep apnea. You may need to bring your device with you.  Keep all follow-up visits as told by your doctor. This is important. Contact a doctor if:  The machine that you were given to use during sleep bothers you or does not seem to be working.  You do not get better.  You get worse. Get help right away if:  Your chest hurts.  You have trouble breathing in enough air.  You have an uncomfortable feeling in your back, arms, or stomach.  You have trouble talking.  One side of your body feels weak.  A part of your face is hanging down. These symptoms may be an emergency. Do not wait to see if the symptoms will go away. Get medical help right away. Call your local emergency services (911 in the [place] Do not drive yourself to the hospital. Summary  This condition affects breathing during sleep.  The most common cause is a collapsed or blocked airway.  The goal of treatment is to help you breathe normally while you sleep. This information is not intended to replace advice given to you by your health care provider.  Make sure you discuss any questions you have with your health care provider. Document Revised: 10/05/2017 Document Reviewed: 08/14/2017 Elsevier Patient Education  [place].

## 2020-05-25 NOTE — Progress Notes (Deleted)
PATIENT: Paul Baker DOB: [birth year]  REASON FOR VISIT: follow up HISTORY FROM: patient  No chief complaint on file.    HISTORY OF PRESENT ILLNESS: 05/25/20 ALL: He returns for follow up for OSA on CPAP, EDS and RLS. He continues modafinil 200mg  daily and ropinirole XL 4mg  at bedtime and IR 2mg  BID.     11/26/2019 ALL: Paul Baker is a 70 y.o. male here today for follow up for OSA on CPAP.  He has done much better with CPAP compliance but continues to struggle with his mask.  He is using a nasal pillow and reports that it slips off of his face every night.  He has tried tightening headgear with no improvement.  He reports that unusual sounds from his CPAP have improved with replacing his supplies.  He continues to work closely with primary care, cardiology and endocrinology.  Blood sugars continue to be uncontrolled.  He is having significant daytime fatigue.  Compliance report dated 10/26/2019 through 11/24/2019 reveals that he used CPAP 26 of the past 30 days for compliance of 87%.  He used CPAP greater than 4 hours 17 of the past 30 days for compliance of 57%.  Average usage on days used was 4 hours and 47 minutes.  Residual AHI was elevated at 10.5 on 10 cm of water and an EPR of 3.  There was a leak noted in the 95th percentile of 42.6 L/min.   HISTORY: (copied from previous note)  Paul Baker is a 70 y.o. male here today for follow up for OSA on CPAP, RLS and EDS. He continues to do fairly well on Requip and modafinil. He admits that he has had a difficult time with using CPAP. He has not been able to get comfortable with his mask. He also states that CPAP will cut off in the middle of the night. He called [employer] but has not taken his machine in to be checked out. He knows that he needs to use CPAP and wants to continue working on compliance.   Compliance report dated 07/26/2019 through 08/24/2019 reveals that he used CPAP 28 of the last 30 days for compliance  of 67%.  He used CPAP greater than 4 hours 6 of the past 30 days for compliance of 20%.  Average usage on days used was 3 hours and 35 minutes.  Residual AHI was 6.8 with a set pressure of 10 cm of water and an EPR of 3.  There was a leak noted in the 95th percentile of 25.4.  He was seen in the ER on 08/04/2019 for dizziness and right sided numbness. CBG was elevated (300's). Insulin was increased and he was started on Plavix for concerns of TIA. He is followed by PCP, last seen 8/18.     REVIEW OF SYSTEMS: Out of a complete 14 system review of symptoms, the patient complains only of the following symptoms, fatigue and all other reviewed systems are negative.  ESS: 19 FSS: 28  ALLERGIES: Allergies  Allergen Reactions  . Tape Rash    Pulls off skin  . Cardizem [Diltiazem Hcl]     Edema   . Cardura [Doxazosin Mesylate]     Headaches / cramps  . Lipitor [Atorvastatin] Other (See Comments)    Leg cramps  . Paxil [Paroxetine Hcl]     Unknown reaction   . Codeine Rash and Other (See Comments)    Headache   . Gabapentin Rash    HOME MEDICATIONS: Outpatient  Medications Prior to Visit  Medication Sig Dispense Refill  . acetaminophen (TYLENOL) 325 MG tablet Take 2 tablets (650 mg total) by mouth every 6 (six) hours as needed for mild pain (or Fever >/= 101). 30 tablet 1  . albuterol (VENTOLIN HFA) 108 (90 Base) MCG/ACT inhaler Inhale 2 puffs into the lungs every 6 (six) hours as needed for wheezing or shortness of breath. 18 g 5  . amLODipine (NORVASC) 5 MG tablet Take 1 tablet (5 mg total) by mouth daily. 30 tablet 5  . apixaban (ELIQUIS) 5 MG TABS tablet Take 1 tablet (5 mg total) by mouth 2 (two) times daily. For stroke prevention 60 tablet 5  . aspirin EC 81 MG tablet Take 1 tablet (81 mg total) by mouth daily with breakfast. Swallow whole. 150 tablet 2  . azithromycin (ZITHROMAX Z-PAK) 250 MG tablet Take 2 tablets (500 mg) on  Day 1,  followed by 1 tablet (250 mg) once daily on Days  2 through 5. 6 each 0  . benzonatate (TESSALON) 100 MG capsule Take 1 capsule (100 mg total) by mouth 2 (two) times daily as needed for cough. 20 capsule 0  . Continuous Blood Gluc Receiver (DEXCOM G6 RECEIVER) DEVI 1 Piece by Does not apply route as needed. 1 Device 0  . Continuous Blood Gluc Sensor (DEXCOM G6 SENSOR) MISC 4 Pieces by Does not apply route once a week. 4 each 2  . EUTHYROX 137 MCG tablet TAKE 1 TABLET BY MOUTH ONCE DAILY BEFORE BREAKFAST 90 tablet 0  . fluticasone furoate-vilanterol (BREO ELLIPTA) 200-25 MCG/INH AEPB Inhale 1 puff by mouth once daily (Patient not taking: Reported on 05/21/2020) 180 each 3  . fluticasone furoate-vilanterol (BREO ELLIPTA) 200-25 MCG/INH AEPB Inhale 1 puff into the lungs daily. 2 each 0  . Insulin Pen Needle (B-D ULTRAFINE III SHORT PEN) 31G X 8 MM MISC USING THREE  TIMES DAILY. 100 each 5  . insulin regular human CONCENTRATED (HUMULIN R U-500 KWIKPEN) 500 UNIT/ML kwikpen Inject 75-85 Units into the skin 3 (three) times daily with meals. Inject 85 units with breakfast and lunch, and 65 units with supper if glucose is above 90 and you are eating 60 mL 2  . isosorbide mononitrate (IMDUR) 60 MG 24 hr tablet TAKE 1 TABLET BY MOUTH IN THE MORNING AND 1/2 (ONE-HALF) IN THE EVENING (Patient taking differently: Take 30-60 mg by mouth in the morning and at bedtime. 1 tablet in am, half a tablet in the evening) 135 tablet 3  . loratadine (CLARITIN) 10 MG tablet Take 10 mg by mouth daily as needed for allergies.     [name] losartan (COZAAR) 50 MG tablet Take 1 tablet (50 mg total) by mouth daily. 90 tablet 0  . metFORMIN (GLUCOPHAGE) 500 MG tablet TAKE 1 TABLET BY MOUTH TWICE DAILY WITH MEALS 180 tablet 0  . metoprolol succinate (TOPROL XL) 25 MG 24 hr tablet Take 1 tablet (25 mg total) by mouth See admin instructions. -Take 1 tablet (25 mg) along with an additional 50 mg tablet for total of 75 mg twice daily 180 tablet 2  . metoprolol succinate (TOPROL-XL) 50 MG 24 hr  tablet Take 1 tablet (50 mg total) by mouth 2 (two) times daily. -Take 1 tablet (50 mg) along with an additional 25 mg tablet for total of 75 mg twice daily 180 tablet 3  . modafinil (PROVIGIL) 200 MG tablet Take 1 tablet (200 mg total) by mouth daily. 30 tablet 5  . nitroGLYCERIN (NITROSTAT)  0.4 MG SL tablet DISSOLVE 1 TABLET UNDER TONGUE EVERY 5 MINUTES UP TO 15 MIN FOR CHESTPAIN. IF NO RELIEF CALL 911. 25 tablet 3  . ONE TOUCH ULTRA TEST test strip TEST BLOOD SUGAR UP TO 4 TIMES DAILY. 150 each 5  . ONETOUCH DELICA LANCETS 16X MISC USE AS DIRECTED TO TEST BLOOD SUGAR 4 TIMES DAILY. 150 each 5  . pravastatin (PRAVACHOL) 40 MG tablet Take 1 tablet (40 mg total) by mouth every evening. 90 tablet 1  . predniSONE (DELTASONE) 20 MG tablet Take 1 tablet (20 mg total) by mouth daily with breakfast. 5 tablet 0  . rOPINIRole (REQUIP XL) 4 MG 24 hr tablet Take 1 tablet (4 mg total) by mouth at bedtime. 90 tablet 1  . rOPINIRole (REQUIP) 4 MG tablet Take 0.5 tablets (2 mg total) by mouth 2 (two) times daily. 90 tablet 1  . Vitamin D, Ergocalciferol, (DRISDOL) 1.25 MG (50000 UNIT) CAPS capsule Take 1 capsule by mouth once a week 12 capsule 0   No facility-administered medications prior to visit.    PAST MEDICAL HISTORY: Past Medical History:  Diagnosis Date  . Arthritis   . Asthma   . Atrial fibrillation [facility])    Diagnosed December 2021  . Colon polyp   . Coronary atherosclerosis of native coronary artery    a. 2011: cath showing 90% stenosis along small non-dominant RCA (too small for PCI). b. 01/2018: cath showing nonobstructive CAD with 60 to 70% proximal to mid nondominant RCA stenosis, 50% mid LAD and 40 to 50% OM1  . DVT (deep venous thrombosis) ([place]) 2005   Right arm  . Essential hypertension   . Headache   . History of transfusion   . Hypothyroidism   . Mixed hyperlipidemia   . Morbid obesity ([place])   . MRSA (methicillin resistant staph aureus) culture positive    08/2012  . Narcolepsy    . OSA (obstructive sleep apnea)    CPAP  . Osteoarthritis   . Pneumonia   . Psoriasis   . Pulmonary embolism ([place]) 2004  . RLS (restless legs syndrome)   . Rotator cuff disorder    Left  . Septic arthritis of knee, left ([place])   . Skin cancer, basal cell   . Type 2 diabetes mellitus ([place])     PAST SURGICAL HISTORY: Past Surgical History:  Procedure Laterality Date  . BACK SURGERY    . CATARACT EXTRACTION Bilateral   . COLONOSCOPY    . CYST REMOVAL TRUNK     from back  . EYE SURGERY Left 2016   laser to left eye  . HIP SURGERY     bone removed from both sides of hip  . KNEE ARTHROTOMY Right 12/04/2014   Procedure: KNEE ARTHROTOMY PATELLA LIGAMENT RECONSTRUSION AND REPAIR RIGHT KNEE;  Surgeon: Paralee Cancel, MD;  Location: [facility];  Service: Orthopedics;  Laterality: Right;  . KNEE SURGERY     X 25 TIMES  . LEFT HEART CATH AND CORONARY ANGIOGRAPHY N/A 01/17/2018   Procedure: LEFT HEART CATH AND CORONARY ANGIOGRAPHY;  Surgeon: Belva Crome, MD;  Location: [facility];  Service: Cardiovascular;  Laterality: N/A;  . LUMBAR DISC SURGERY     Left L3, L4, L5 discecotomy with decompression of L4 root  . TONSILLECTOMY    . TOTAL KNEE ARTHROPLASTY  2003   LEFT  . TOTAL KNEE ARTHROPLASTY Right 03/23/2014   Procedure: RIGHT TOTAL KNEE ARTHROPLASTY AND REMOVAL RIGHT TIBIAL  DEEP IMPLANT STAPLE;  Surgeon: Paralee Cancel, MD;  Location: [facility];  Service: Orthopedics;  Laterality: Right;  . TOTAL KNEE REVISION  2005   LEFT  . WRIST SURGERY      FAMILY HISTORY: Family History  Problem Relation Age of Onset  . Hypertension Father   . Heart attack Father   . Kidney Stones Father   . Seizures Grandchild   . Narcolepsy Grandchild   . Diabetes Sister     SOCIAL HISTORY: Social History   Socioeconomic History  . Marital status: Married    Spouse name: Toney Reil  . Number of children: 2  . Years of education: college  . Highest education level: Not on file  Occupational History   . Occupation: Disabled    Employer: UNEMPLOYED  Tobacco Use  . Smoking status: Former Smoker    Packs/day: 1.50    Years: 10.00    Pack years: 15.00    Types: Cigarettes    Start date: 06/19/1967    Quit date: 01/03/1995    Years since quitting: 25.4  . Smokeless tobacco: Never Used  Vaping Use  . Vaping Use: Never used  Substance and Sexual Activity  . Alcohol use: No    Alcohol/week: 0.0 standard drinks    Comment: quit drinking in 07/86  . Drug use: No  . Sexual activity: Yes    Partners: Female  Other Topics Concern  . Not on file  Social History Narrative    70 year old, right-handed, caucasian male with a past medical history of obesity, hypertension, hyperlipidemia, diabetes, obstructive sleep apnea, presenting with frequent nighttime awakenings, excessive daytime sleepiness, also transient confusional episodes.RLS and one beosity, OSA on CPAP with AHI of 3.2 and  setting of 16 cm water , [facility] .   Social Determinants of Health   Financial Resource Strain: Not on file  Food Insecurity: Not on file  Transportation Needs: Not on file  Physical Activity: Not on file  Stress: Not on file  Social Connections: Not on file  Intimate Partner Violence: Not on file     PHYSICAL EXAM  There were no vitals filed for this visit. There is no height or weight on file to calculate BMI.  Generalized: Well developed, in no acute distress  Cardiology: normal rate and rhythm, no murmur noted Respiratory: clear to auscultation bilaterally  Neurological examination  Mentation: Alert oriented to time, place, history taking. Follows all commands speech and language fluent Cranial nerve II-XII: Pupils were equal round reactive to light. Extraocular movements were full, visual field were full  Motor: The motor testing reveals 5 over 5 strength of all 4 extremities. Good symmetric motor tone is noted throughout.  Gait and station: Gait is stable without assistive  devices.   DIAGNOSTIC DATA (LABS, IMAGING, TESTING) - I reviewed patient records, labs, notes, testing and imaging myself where available.  No flowsheet data found.   Lab Results  Component Value Date   WBC 10.0 05/23/2020   HGB 11.8 (L) 05/23/2020   HCT 36.4 (L) 05/23/2020   MCV 92.2 05/23/2020   PLT 274 05/23/2020      Component Value Date/Time   NA 137 05/23/2020 2150   NA 138 03/10/2020 0805   K 3.8 05/23/2020 2150   CL 103 05/23/2020 2150   CO2 24 05/23/2020 2150   GLUCOSE 196 (H) 05/23/2020 2150   BUN 37 (H) 05/23/2020 2150   BUN 22 03/10/2020 0805   CREATININE 1.43 (H) 05/23/2020 2150   CREATININE 1.11 02/06/2019 0813  CALCIUM 9.3 05/23/2020 2150   PROT 6.9 05/23/2020 2150   PROT 6.6 03/10/2020 0805   ALBUMIN 3.8 05/23/2020 2150   ALBUMIN 4.3 03/10/2020 0805   AST 23 05/23/2020 2150   ALT 23 05/23/2020 2150   ALKPHOS 50 05/23/2020 2150   BILITOT 0.4 05/23/2020 2150   BILITOT 0.3 03/10/2020 0805   GFRNONAA 53 (L) 05/23/2020 2150   GFRNONAA 68 02/06/2019 0813   GFRAA 87 12/04/2019 0832   GFRAA 79 02/06/2019 0813   Lab Results  Component Value Date   CHOL 177 08/05/2019   HDL 56 08/05/2019   LDLCALC 102 (H) 08/05/2019   TRIG 95 08/05/2019   CHOLHDL 3.2 08/05/2019   Lab Results  Component Value Date   HGBA1C 9.7 (A) 03/18/2020   No results found for: VITAMINB12 Lab Results  Component Value Date   TSH 1.310 03/10/2020     ASSESSMENT AND PLAN 70 y.o. year old male  has a past medical history of Arthritis, Asthma, Atrial fibrillation ([place]), Colon polyp, Coronary atherosclerosis of native coronary artery, DVT (deep venous thrombosis) ([place]) (2005), Essential hypertension, Headache, History of transfusion, Hypothyroidism, Mixed hyperlipidemia, Morbid obesity ([place]), MRSA (methicillin resistant staph aureus) culture positive, Narcolepsy, OSA (obstructive sleep apnea), Osteoarthritis, Pneumonia, Psoriasis, Pulmonary embolism ([place]) (2004), RLS (restless legs  syndrome), Rotator cuff disorder, Septic arthritis of knee, left ([place]), Skin cancer, basal cell, and Type 2 diabetes mellitus ([place]). here with     ICD-10-CM   1. OSA on CPAP  G47.33    Z99.89   2. Excessive daytime sleepiness  G47.19   3. RLS (restless legs syndrome)  G25.81      Paul Baker is doing better on CPAP therapy. Compliance report reveals acceptable daily compliance with suboptimal 4-hour compliance.  Compliance has improved since his last visit and he remains motivated to use CPAP.  We will order a mask refitting as I feel that this will help with usage and help get AHI to goal of less than 5 events per hour.  I will reprint download in 6 to 8 weeks to evaluate.  He will continue close follow-up with primary care and endocrinology for uncontrolled diabetes and stroke prevention. We have discussed link between fatigue/daytime sleepiness with fluctuating blood sugars. He was encouraged to continue using CPAP nightly and for greater than 4 hours each night. We will update supply orders as indicated. Risks of untreated sleep apnea review and education materials provided. Healthy lifestyle habits encouraged. He will follow up in 6 months, sooner if needed. He verbalizes understanding and agreement with this plan.     No orders of the defined types were placed in this encounter.    No orders of the defined types were placed in this encounter.     Debbora Presto, FNP-C 05/25/2020, 4:32 PM [facility] [address], [place], [place] [address] [phone]

## 2020-05-26 ENCOUNTER — Ambulatory Visit: Payer: Medicare HMO | Admitting: Family Medicine

## 2020-05-26 ENCOUNTER — Encounter: Payer: Self-pay | Admitting: Family Medicine

## 2020-05-26 VITALS — BP 150/78 | HR 71 | Ht 65.0 in | Wt 228.0 lb

## 2020-05-26 DIAGNOSIS — G4733 Obstructive sleep apnea (adult) (pediatric): Secondary | ICD-10-CM | POA: Diagnosis not present

## 2020-05-26 DIAGNOSIS — Z9989 Dependence on other enabling machines and devices: Secondary | ICD-10-CM | POA: Diagnosis not present

## 2020-05-26 DIAGNOSIS — G4719 Other hypersomnia: Secondary | ICD-10-CM | POA: Diagnosis not present

## 2020-05-26 DIAGNOSIS — G2581 Restless legs syndrome: Secondary | ICD-10-CM

## 2020-05-26 NOTE — Progress Notes (Signed)
PATIENT: Paul Baker DOB: [birth year]  REASON FOR VISIT: follow up HISTORY FROM: patient  Chief Complaint  Patient presents with  . Follow-up    RM 1 alone Pt is well, CPAP helps him but he doesn't sleep throughout the night. Overall doing good       HISTORY OF PRESENT ILLNESS: 05/26/20 ALL: He returns for follow up for OSA on CPAP, EDS and RLS. He continues ropinirole XL 4mg  BID and IR 2mg  BID. He feels RLS symptoms are fairly stable. He has not resumed modafinil for EDS. He reports that medication is expensive and he is unsure if it helps. He does not monitor BP at home. Readings are usually 130's/80's in the PCP office.   He reports that he is using CPAP nightly. He did get a new FFM and feels it has worked better than nasal pillow. He places mask around 10pm and takes it off around 7. He does wake to urinate a couple of times throughout the night. ResMed data documenting 3-4 hour usage, on average. Also not recording any data past 04/13/2020. 4 hour compliance has consistently been low. Paul Baker feels this is inaccurate. He is adamant that he has used CPAP consistently.   He was diagnosed with Covid about 3-4 weeks ago. He was seen by PCP 05/21/2020 for continued shob. Xray showed possible pna. He was treated with abx and steroids. ER visit 5/23 for dyspnea, workup unremarkable. He reports feeling better, today. He admits CBGs continue to fluctuate. They seem a little better over the past few weeks but were elevated last night and low this morning. He is followed closely by endocrinology.   Data pulled with SD card reveals that he has used CPAP 30/30 days. He used CPAP greater than 4 hours 20/30 days for compliance of 67%. Average usage was 4hr 38min. Residual AHI 6.9 on set pressure of 10cmH20 and ERP 3. Leak was 21.1l/min.    11/26/2019 ALL:  Paul Baker is a 70 y.o. male here today for follow up for OSA on CPAP.  He has done much better with CPAP compliance but  continues to struggle with his mask.  He is using a nasal pillow and reports that it slips off of his face every night.  He has tried tightening headgear with no improvement.  He reports that unusual sounds from his CPAP have improved with replacing his supplies.  He continues to work closely with primary care, cardiology and endocrinology.  Blood sugars continue to be uncontrolled.  He is having significant daytime fatigue.  Compliance report dated 10/26/2019 through 11/24/2019 reveals that he used CPAP 26 of the past 30 days for compliance of 87%.  He used CPAP greater than 4 hours 17 of the past 30 days for compliance of 57%.  Average usage on days used was 4 hours and 47 minutes.  Residual AHI was elevated at 10.5 on 10 cm of water and an EPR of 3.  There was a leak noted in the 95th percentile of 42.6 L/min.   HISTORY: (copied from previous note)  Paul Baker is a 70 y.o. male here today for follow up for OSA on CPAP, RLS and EDS. He continues to do fairly well on Requip and modafinil. He admits that he has had a difficult time with using CPAP. He has not been able to get comfortable with his mask. He also states that CPAP will cut off in the middle of the night. He called [employer] but  has not taken his machine in to be checked out. He knows that he needs to use CPAP and wants to continue working on compliance.   Compliance report dated 07/26/2019 through 08/24/2019 reveals that he used CPAP 28 of the last 30 days for compliance of 67%.  He used CPAP greater than 4 hours 6 of the past 30 days for compliance of 20%.  Average usage on days used was 3 hours and 35 minutes.  Residual AHI was 6.8 with a set pressure of 10 cm of water and an EPR of 3.  There was a leak noted in the 95th percentile of 25.4.  He was seen in the ER on 08/04/2019 for dizziness and right sided numbness. CBG was elevated (300's). Insulin was increased and he was started on Plavix for concerns of TIA. He is followed by PCP,  last seen 8/18.     REVIEW OF SYSTEMS: Out of a complete 14 system review of symptoms, the patient complains only of the following symptoms, fatigue and all other reviewed systems are negative.  ESS: 22 FSS: 33  ALLERGIES: Allergies  Allergen Reactions  . Tape Rash    Pulls off skin  . Cardizem [Diltiazem Hcl]     Edema   . Cardura [Doxazosin Mesylate]     Headaches / cramps  . Lipitor [Atorvastatin] Other (See Comments)    Leg cramps  . Paxil [Paroxetine Hcl]     Unknown reaction   . Codeine Rash and Other (See Comments)    Headache   . Gabapentin Rash    HOME MEDICATIONS: Outpatient Medications Prior to Visit  Medication Sig Dispense Refill  . acetaminophen (TYLENOL) 325 MG tablet Take 2 tablets (650 mg total) by mouth every 6 (six) hours as needed for mild pain (or Fever >/= 101). 30 tablet 1  . albuterol (VENTOLIN HFA) 108 (90 Base) MCG/ACT inhaler Inhale 2 puffs into the lungs every 6 (six) hours as needed for wheezing or shortness of breath. 18 g 5  . amLODipine (NORVASC) 5 MG tablet Take 1 tablet (5 mg total) by mouth daily. 30 tablet 5  . apixaban (ELIQUIS) 5 MG TABS tablet Take 1 tablet (5 mg total) by mouth 2 (two) times daily. For stroke prevention 60 tablet 5  . aspirin EC 81 MG tablet Take 1 tablet (81 mg total) by mouth daily with breakfast. Swallow whole. 150 tablet 2  . azithromycin (ZITHROMAX Z-PAK) 250 MG tablet Take 2 tablets (500 mg) on  Day 1,  followed by 1 tablet (250 mg) once daily on Days 2 through 5. 6 each 0  . benzonatate (TESSALON) 100 MG capsule Take 1 capsule (100 mg total) by mouth 2 (two) times daily as needed for cough. 20 capsule 0  . Continuous Blood Gluc Receiver (DEXCOM G6 RECEIVER) DEVI 1 Piece by Does not apply route as needed. 1 Device 0  . Continuous Blood Gluc Sensor (DEXCOM G6 SENSOR) MISC 4 Pieces by Does not apply route once a week. 4 each 2  . EUTHYROX 137 MCG tablet TAKE 1 TABLET BY MOUTH ONCE DAILY BEFORE BREAKFAST 90 tablet 0   . fluticasone furoate-vilanterol (BREO ELLIPTA) 200-25 MCG/INH AEPB Inhale 1 puff by mouth once daily 180 each 3  . fluticasone furoate-vilanterol (BREO ELLIPTA) 200-25 MCG/INH AEPB Inhale 1 puff into the lungs daily. 2 each 0  . Insulin Pen Needle (B-D ULTRAFINE III SHORT PEN) 31G X 8 MM MISC USING THREE  TIMES DAILY. 100 each 5  . insulin  regular human CONCENTRATED (HUMULIN R U-500 KWIKPEN) 500 UNIT/ML kwikpen Inject 75-85 Units into the skin 3 (three) times daily with meals. Inject 85 units with breakfast and lunch, and 65 units with supper if glucose is above 90 and you are eating 60 mL 2  . isosorbide mononitrate (IMDUR) 60 MG 24 hr tablet TAKE 1 TABLET BY MOUTH IN THE MORNING AND 1/2 (ONE-HALF) IN THE EVENING (Patient taking differently: Take 30-60 mg by mouth in the morning and at bedtime. 1 tablet in am, half a tablet in the evening) 135 tablet 3  . loratadine (CLARITIN) 10 MG tablet Take 10 mg by mouth daily as needed for allergies.     [name] losartan (COZAAR) 50 MG tablet Take 1 tablet (50 mg total) by mouth daily. 90 tablet 0  . metFORMIN (GLUCOPHAGE) 500 MG tablet TAKE 1 TABLET BY MOUTH TWICE DAILY WITH MEALS 180 tablet 0  . metoprolol succinate (TOPROL XL) 25 MG 24 hr tablet Take 1 tablet (25 mg total) by mouth See admin instructions. -Take 1 tablet (25 mg) along with an additional 50 mg tablet for total of 75 mg twice daily 180 tablet 2  . metoprolol succinate (TOPROL-XL) 50 MG 24 hr tablet Take 1 tablet (50 mg total) by mouth 2 (two) times daily. -Take 1 tablet (50 mg) along with an additional 25 mg tablet for total of 75 mg twice daily 180 tablet 3  . modafinil (PROVIGIL) 200 MG tablet Take 1 tablet (200 mg total) by mouth daily. 30 tablet 5  . nitroGLYCERIN (NITROSTAT) 0.4 MG SL tablet DISSOLVE 1 TABLET UNDER TONGUE EVERY 5 MINUTES UP TO 15 MIN FOR CHESTPAIN. IF NO RELIEF CALL 911. 25 tablet 3  . ONE TOUCH ULTRA TEST test strip TEST BLOOD SUGAR UP TO 4 TIMES DAILY. 150 each 5  . ONETOUCH  DELICA LANCETS 25K MISC USE AS DIRECTED TO TEST BLOOD SUGAR 4 TIMES DAILY. 150 each 5  . pravastatin (PRAVACHOL) 40 MG tablet Take 1 tablet (40 mg total) by mouth every evening. 90 tablet 1  . predniSONE (DELTASONE) 20 MG tablet Take 1 tablet (20 mg total) by mouth daily with breakfast. 5 tablet 0  . rOPINIRole (REQUIP XL) 4 MG 24 hr tablet Take 1 tablet (4 mg total) by mouth at bedtime. 90 tablet 1  . rOPINIRole (REQUIP) 4 MG tablet Take 0.5 tablets (2 mg total) by mouth 2 (two) times daily. 90 tablet 1  . Vitamin D, Ergocalciferol, (DRISDOL) 1.25 MG (50000 UNIT) CAPS capsule Take 1 capsule by mouth once a week 12 capsule 0   No facility-administered medications prior to visit.    PAST MEDICAL HISTORY: Past Medical History:  Diagnosis Date  . Arthritis   . Asthma   . Atrial fibrillation [facility])    Diagnosed December 2021  . Colon polyp   . Coronary atherosclerosis of native coronary artery    a. 2011: cath showing 90% stenosis along small non-dominant RCA (too small for PCI). b. 01/2018: cath showing nonobstructive CAD with 60 to 70% proximal to mid nondominant RCA stenosis, 50% mid LAD and 40 to 50% OM1  . DVT (deep venous thrombosis) ([place]) 2005   Right arm  . Essential hypertension   . Headache   . History of transfusion   . Hypothyroidism   . Mixed hyperlipidemia   . Morbid obesity ([place])   . MRSA (methicillin resistant staph aureus) culture positive    08/2012  . Narcolepsy   . OSA (obstructive sleep apnea)  CPAP  . Osteoarthritis   . Pneumonia   . Psoriasis   . Pulmonary embolism ([place]) 2004  . RLS (restless legs syndrome)   . Rotator cuff disorder    Left  . Septic arthritis of knee, left ([place])   . Skin cancer, basal cell   . Type 2 diabetes mellitus ([place])     PAST SURGICAL HISTORY: Past Surgical History:  Procedure Laterality Date  . BACK SURGERY    . CATARACT EXTRACTION Bilateral   . COLONOSCOPY    . CYST REMOVAL TRUNK     from back  . EYE SURGERY Left 2016    laser to left eye  . HIP SURGERY     bone removed from both sides of hip  . KNEE ARTHROTOMY Right 12/04/2014   Procedure: KNEE ARTHROTOMY PATELLA LIGAMENT RECONSTRUSION AND REPAIR RIGHT KNEE;  Surgeon: Paralee Cancel, MD;  Location: [place];  Service: Orthopedics;  Laterality: Right;  . KNEE SURGERY     X 25 TIMES  . LEFT HEART CATH AND CORONARY ANGIOGRAPHY N/A 01/17/2018   Procedure: LEFT HEART CATH AND CORONARY ANGIOGRAPHY;  Surgeon: Belva Crome, MD;  Location: [place] [facility];  Service: Cardiovascular;  Laterality: N/A;  . LUMBAR DISC SURGERY     Left L3, L4, L5 discecotomy with decompression of L4 root  . TONSILLECTOMY    . TOTAL KNEE ARTHROPLASTY  2003   LEFT  . TOTAL KNEE ARTHROPLASTY Right 03/23/2014   Procedure: RIGHT TOTAL KNEE ARTHROPLASTY AND REMOVAL RIGHT TIBIAL  DEEP IMPLANT STAPLE;  Surgeon: Paralee Cancel, MD;  Location: [facility];  Service: Orthopedics;  Laterality: Right;  . TOTAL KNEE REVISION  2005   LEFT  . WRIST SURGERY      FAMILY HISTORY: Family History  Problem Relation Age of Onset  . Hypertension Father   . Heart attack Father   . Kidney Stones Father   . Seizures Grandchild   . Narcolepsy Grandchild   . Diabetes Sister     SOCIAL HISTORY: Social History   Socioeconomic History  . Marital status: Married    Spouse name: Toney Reil  . Number of children: 2  . Years of education: college  . Highest education level: Not on file  Occupational History  . Occupation: Disabled    Employer: UNEMPLOYED  Tobacco Use  . Smoking status: Former Smoker    Packs/day: 1.50    Years: 10.00    Pack years: 15.00    Types: Cigarettes    Start date: 06/19/1967    Quit date: 01/03/1995    Years since quitting: 25.4  . Smokeless tobacco: Never Used  Vaping Use  . Vaping Use: Never used  Substance and Sexual Activity  . Alcohol use: No    Alcohol/week: 0.0 standard drinks    Comment: quit drinking in 07/86  . Drug use: No  . Sexual activity: Yes    Partners:  Female  Other Topics Concern  . Not on file  Social History Narrative    70 year old, right-handed, caucasian male with a past medical history of obesity, hypertension, hyperlipidemia, diabetes, obstructive sleep apnea, presenting with frequent nighttime awakenings, excessive daytime sleepiness, also transient confusional episodes.RLS and one beosity, OSA on CPAP with AHI of 3.2 and  setting of 16 cm water , [facility] .   Social Determinants of Health   Financial Resource Strain: Not on file  Food Insecurity: Not on file  Transportation Needs: Not on file  Physical Activity: Not on file  Stress:  Not on file  Social Connections: Not on file  Intimate Partner Violence: Not on file     PHYSICAL EXAM  Vitals:   05/26/20 0747  BP: (!) 150/78  Pulse: 71  Weight: 228 lb (103.4 kg)  Height: 5\' 5"  (1.651 m)   Body mass index is 37.94 kg/m.  Generalized: Well developed, in no acute distress  Cardiology: normal rate and rhythm, no murmur noted Respiratory: clear to auscultation bilaterally  Neurological examination  Mentation: Alert oriented to time, place, history taking. Follows all commands speech and language fluent Cranial nerve II-XII: Pupils were equal round reactive to light. Extraocular movements were full, visual field were full  Motor: The motor testing reveals 5 over 5 strength of all 4 extremities. Good symmetric motor tone is noted throughout.  Gait and station: Gait is stable without assistive devices.   DIAGNOSTIC DATA (LABS, IMAGING, TESTING) - I reviewed patient records, labs, notes, testing and imaging myself where available.  No flowsheet data found.   Lab Results  Component Value Date   WBC 10.0 05/23/2020   HGB 11.8 (L) 05/23/2020   HCT 36.4 (L) 05/23/2020   MCV 92.2 05/23/2020   PLT 274 05/23/2020      Component Value Date/Time   NA 137 05/23/2020 2150   NA 138 03/10/2020 0805   K 3.8 05/23/2020 2150   CL 103 05/23/2020 2150   CO2 24  05/23/2020 2150   GLUCOSE 196 (H) 05/23/2020 2150   BUN 37 (H) 05/23/2020 2150   BUN 22 03/10/2020 0805   CREATININE 1.43 (H) 05/23/2020 2150   CREATININE 1.11 02/06/2019 0813   CALCIUM 9.3 05/23/2020 2150   PROT 6.9 05/23/2020 2150   PROT 6.6 03/10/2020 0805   ALBUMIN 3.8 05/23/2020 2150   ALBUMIN 4.3 03/10/2020 0805   AST 23 05/23/2020 2150   ALT 23 05/23/2020 2150   ALKPHOS 50 05/23/2020 2150   BILITOT 0.4 05/23/2020 2150   BILITOT 0.3 03/10/2020 0805   GFRNONAA 53 (L) 05/23/2020 2150   GFRNONAA 68 02/06/2019 0813   GFRAA 87 12/04/2019 0832   GFRAA 79 02/06/2019 0813   Lab Results  Component Value Date   CHOL 177 08/05/2019   HDL 56 08/05/2019   LDLCALC 102 (H) 08/05/2019   TRIG 95 08/05/2019   CHOLHDL 3.2 08/05/2019   Lab Results  Component Value Date   HGBA1C 9.7 (A) 03/18/2020   No results found for: VITAMINB12 Lab Results  Component Value Date   TSH 1.310 03/10/2020     ASSESSMENT AND PLAN 70 y.o. year old male  has a past medical history of Arthritis, Asthma, Atrial fibrillation ([place]), Colon polyp, Coronary atherosclerosis of native coronary artery, DVT (deep venous thrombosis) ([place]) (2005), Essential hypertension, Headache, History of transfusion, Hypothyroidism, Mixed hyperlipidemia, Morbid obesity ([place]), MRSA (methicillin resistant staph aureus) culture positive, Narcolepsy, OSA (obstructive sleep apnea), Osteoarthritis, Pneumonia, Psoriasis, Pulmonary embolism ([place]) (2004), RLS (restless legs syndrome), Rotator cuff disorder, Septic arthritis of knee, left ([place]), Skin cancer, basal cell, and Type 2 diabetes mellitus ([place]). here with     ICD-10-CM   1. OSA on CPAP  G47.33 For home use only DME continuous positive airway pressure (CPAP)   Z99.89   2. Excessive daytime sleepiness  G47.19   3. RLS (restless legs syndrome)  G25.81      Paul Baker is doing better on CPAP therapy. Compliance report reveals excellent daily compliance with suboptimal  4-hour compliance. 4 hour compliance is steadily improving. Now 67%, previously 57%.  Leak is significantly improved. AHI remains elevated. I will increase pressure to 11cmH20. RLS is well managed on ropinirole and he will continue as prescribed. EDS remains a concern and multiple factors contributing. He may resume modafinil if ne wishes but encouraged to monitor BP closely. He will call if readings consistently greater than 140/80.  He will continue close follow-up with primary care and endocrinology for uncontrolled diabetes and stroke prevention. We have discussed link between fatigue/daytime sleepiness with fluctuating blood sugars. He was encouraged to continue using CPAP nightly and for greater than 4 hours each night. We will update supply orders as indicated. Risks of untreated sleep apnea review and education materials provided. Healthy lifestyle habits encouraged. He will follow up in 6 months, sooner if needed. He verbalizes understanding and agreement with this plan.     Orders Placed This Encounter  Procedures  . For home use only DME continuous positive airway pressure (CPAP)    Increase set pressure to 11cmH20. Adjusted in resmed by NP.    Order Specific Question:   Length of Need    Answer:   Lifetime    Order Specific Question:   Patient has OSA or probable OSA    Answer:   Yes    Order Specific Question:   Is the patient currently using CPAP in the home    Answer:   Yes    Order Specific Question:   Settings    Answer:   Other see comments    Order Specific Question:   CPAP supplies needed    Answer:   Mask, headgear, cushions, filters, heated tubing and water chamber     No orders of the defined types were placed in this encounter.        Debbora Presto, FNP-C 05/26/2020, 8:53 AM [facility] [address] [place], [place] [address] [phone]

## 2020-05-27 ENCOUNTER — Telehealth: Payer: Self-pay | Admitting: Pulmonary Disease

## 2020-05-27 NOTE — Telephone Encounter (Signed)
Called and spoke with patient who stated he was returning phone call to get lab results. Per Rexene Edison NP:   Melvenia Needles, NP  05/21/2020 2:33 PM EDT      Blood work shows congestive heart failure marker is normal. Electrolytes are normal. Your blood sugar is elevated please continue with your sliding scale insulin as recommended. Please watch her sugars closely as you are starting steroids. Report if remains elevated.  Please contact office for sooner follow up if symptoms do not improve or worsen or seek emergency care    Went over lab results per T. Parrett NP with patient. All questions answered and patient expressed full understanding of results and Tammy's recommendations. Nothing further needed at this time.

## 2020-05-27 NOTE — Progress Notes (Signed)
Patient returned phone call, name and birth date confirmed. See telephone note from today (05/27/20)

## 2020-06-01 ENCOUNTER — Telehealth: Payer: Self-pay | Admitting: Pharmacy Technician

## 2020-06-01 DIAGNOSIS — Z596 Low income: Secondary | ICD-10-CM

## 2020-06-01 NOTE — Progress Notes (Signed)
[facility])                                            [place] Team    06/01/2020  DAYMEIN NUNNERY 08/06/1950 [id number]  Received both patient and provider portion(s) of patient assistance application(s) for Eliquis and Breo. Faxed completed application and required documents into [employer] and [employer].    Madisynn Plair P. Zalea Pete, [place]  [phone]

## 2020-06-02 ENCOUNTER — Encounter: Payer: Self-pay | Admitting: *Deleted

## 2020-06-02 NOTE — Progress Notes (Signed)
Fax notification received from [employer] that patient approved to receive eliquis from 06/02/20 through 01/01/21

## 2020-06-03 ENCOUNTER — Encounter: Payer: Self-pay | Admitting: Adult Health

## 2020-06-03 ENCOUNTER — Other Ambulatory Visit: Payer: Self-pay

## 2020-06-03 ENCOUNTER — Ambulatory Visit: Payer: Medicare HMO | Admitting: Adult Health

## 2020-06-03 ENCOUNTER — Ambulatory Visit (INDEPENDENT_AMBULATORY_CARE_PROVIDER_SITE_OTHER): Payer: Medicare HMO

## 2020-06-03 DIAGNOSIS — J4531 Mild persistent asthma with (acute) exacerbation: Secondary | ICD-10-CM

## 2020-06-03 DIAGNOSIS — I5189 Other ill-defined heart diseases: Secondary | ICD-10-CM

## 2020-06-03 DIAGNOSIS — J189 Pneumonia, unspecified organism: Secondary | ICD-10-CM | POA: Diagnosis not present

## 2020-06-03 DIAGNOSIS — Z9989 Dependence on other enabling machines and devices: Secondary | ICD-10-CM

## 2020-06-03 DIAGNOSIS — Z8709 Personal history of other diseases of the respiratory system: Secondary | ICD-10-CM | POA: Diagnosis not present

## 2020-06-03 DIAGNOSIS — R059 Cough, unspecified: Secondary | ICD-10-CM | POA: Diagnosis not present

## 2020-06-03 DIAGNOSIS — G4733 Obstructive sleep apnea (adult) (pediatric): Secondary | ICD-10-CM

## 2020-06-03 DIAGNOSIS — U071 COVID-19: Secondary | ICD-10-CM

## 2020-06-03 MED ORDER — BREO ELLIPTA 200-25 MCG/INH IN AEPB: INHALATION_SPRAY | RESPIRATORY_TRACT | 3 refills | Status: DC

## 2020-06-03 MED ORDER — ALBUTEROL SULFATE HFA 108 (90 BASE) MCG/ACT IN AERS: 2.0000 | INHALATION_SPRAY | Freq: Four times a day (QID) | RESPIRATORY_TRACT | 5 refills | Status: DC | PRN

## 2020-06-03 NOTE — Assessment & Plan Note (Signed)
Recent exacerbation with COVID-19 infection now improving Continue to follow on Breo patient assistance paperwork  Plan  Patient Instructions   Mucinex DM Twice daily  As needed  Cough and congestion  Continue on BREO 1 puff daily, rinse after use. (call back if not covered )  Albuterol inhaler As needed   Low salt diet , TED hose  Follow up with Cardiology as discussed  Follow up with Dr. Halford Chessman next month as planned and As needed   Please contact office for sooner follow up if symptoms do not improve or worsen or seek emergency care

## 2020-06-03 NOTE — Progress Notes (Signed)
Reviewed and agree with assessment/plan.   Chesley Mires, MD [facility] Pulmonary/Critical Care 06/03/2020, 12:36 PM Pager:  [phone]

## 2020-06-03 NOTE — Assessment & Plan Note (Signed)
Recent COVID-19 infection now resolving.  Patient is starting to make clinical improvement.  Continue to follow closely

## 2020-06-03 NOTE — Assessment & Plan Note (Signed)
Diastolic dysfunction.-During recent flare with suspected mild decompensation.  Have to use caution with chronic diuretics as she he has chronic kidney disease.  Have asked him to follow-up with cardiology for ongoing management. Continue on current dose of Lasix Low-salt diet and TED stockings

## 2020-06-03 NOTE — Addendum Note (Signed)
Addended by: Vanessa Barbara on: 06/03/2020 01:54 PM   Modules accepted: Orders

## 2020-06-03 NOTE — Progress Notes (Signed)
@Patient  ID: Paul Baker, male    DOB: 05/02/1950, 70 y.o.   MRN: [id number]  Chief Complaint  Patient presents with  . Follow-up    Referring provider: Erven Colla, DO  HPI: 70 year old male former smoker followed for severe persistent asthma Medical history significant for obstructive sleep apnea on CPAP  and restless leg syndrome followed by [facility] neurology, coronary artery disease, fatty liver disease, right subclavian vein stenosis, spinal stenosis, previous DVT right arm, psoriasis, septic arthritis in left knee, diabetes, A. fib on Eliquis.   TEST/EVENTS :  Spirometry 10/17/11 >> 2.58 (89%), FEV1% 85 PFT 09/11/18 >> FEV1 2.19 (79%), FEV1% 82, TLC 4.48 (74%), DLCO 70%, +BD from change in FEF 25-75%  Chest imaging:  HRCT chest 12/19/18 >> atherosclerosis, patchy air trapping, fatty liver  Chest x-ray February 2022 no acute pulmonary disease  Sleep tests:  PSG 2012 >> AHI 60  Cardiac tests:  LHC 01/17/18 >> non obstructive CAD Echo 02/21/18 >> EF 55 to 60%  06/03/2020 Follow up: Asthma, Covid  Patient returns for a 2-week follow-up.  Patient was seen last visit with an asthma exacerbation with recent COVID-19 infection.  He was given a Z-Pak and a prednisone taper.  Previously had been treated with an antiviral COVID 5-day pack.  He also appeared to have some mild diastolic dysfunction.  He was recommended to take an extra Lasix for 2 days.  Chest x-ray showed a subtle opacity in the right upper and right lower lobes suspicious for possible atypical pneumonia. Patient continued to have ongoing shortness of breath and went to the emergency room on May 23, 2020.  CT chest showed clear lungs.  Negative for PE and no adenopathy. Chest x-ray today shows no acute process. Since last visit patient is feeling some better, decreased dyspnea and cough . Remains weak, gets winded with activity . He remains on BREO , patient assistance paperwork is pending . He is using  samples currently .  Says he continues to have chronic lower extremity swelling . On Lasix 120mg  daily .  No significant change from last office visit. Follows with cardiology and nephrology ([facility]) .     Allergies  Allergen Reactions  . Tape Rash    Pulls off skin  . Cardizem [Diltiazem Hcl]     Edema   . Cardura [Doxazosin Mesylate]     Headaches / cramps  . Lipitor [Atorvastatin] Other (See Comments)    Leg cramps  . Paxil [Paroxetine Hcl]     Unknown reaction   . Codeine Rash and Other (See Comments)    Headache   . Gabapentin Rash    Immunization History  Administered Date(s) Administered  . Influenza Split 09/18/2012  . Influenza,inj,Quad PF,6+ Mos 10/13/2013, 10/06/2014, 09/11/2018  . Influenza-Unspecified 12/01/2015, 11/08/2016  . PFIZER(Purple Top)SARS-COV-2 Vaccination 03/06/2019, 03/27/2019  . Pneumococcal Polysaccharide-23 10/02/2004, 10/03/2011  . Td 05/24/2006    Past Medical History:  Diagnosis Date  . Arthritis   . Asthma   . Atrial fibrillation [facility])    Diagnosed December 2021  . Colon polyp   . Coronary atherosclerosis of native coronary artery    a. 2011: cath showing 90% stenosis along small non-dominant RCA (too small for PCI). b. 01/2018: cath showing nonobstructive CAD with 60 to 70% proximal to mid nondominant RCA stenosis, 50% mid LAD and 40 to 50% OM1  . DVT (deep venous thrombosis) ([place]) 2005   Right arm  . Essential hypertension   . Headache   .  History of transfusion   . Hypothyroidism   . Mixed hyperlipidemia   . Morbid obesity ([place])   . MRSA (methicillin resistant staph aureus) culture positive    08/2012  . Narcolepsy   . OSA (obstructive sleep apnea)    CPAP  . Osteoarthritis   . Pneumonia   . Psoriasis   . Pulmonary embolism ([place]) 2004  . RLS (restless legs syndrome)   . Rotator cuff disorder    Left  . Septic arthritis of knee, left ([place])   . Skin cancer, basal cell   . Type 2 diabetes mellitus (H[facility])     Tobacco  History: Social History   Tobacco Use  Smoking Status Former Smoker  . Packs/day: 1.50  . Years: 10.00  . Pack years: 15.00  . Types: Cigarettes  . Start date: 06/19/1967  . Quit date: 01/03/1995  . Years since quitting: 25.4  Smokeless Tobacco Never Used   Counseling given: Not Answered   Outpatient Medications Prior to Visit  Medication Sig Dispense Refill  . acetaminophen (TYLENOL) 325 MG tablet Take 2 tablets (650 mg total) by mouth every 6 (six) hours as needed for mild pain (or Fever >/= 101). 30 tablet 1  . albuterol (VENTOLIN HFA) 108 (90 Base) MCG/ACT inhaler Inhale 2 puffs into the lungs every 6 (six) hours as needed for wheezing or shortness of breath. 18 g 5  . amLODipine (NORVASC) 5 MG tablet Take 1 tablet (5 mg total) by mouth daily. 30 tablet 5  . apixaban (ELIQUIS) 5 MG TABS tablet Take 1 tablet (5 mg total) by mouth 2 (two) times daily. For stroke prevention 60 tablet 5  . aspirin EC 81 MG tablet Take 1 tablet (81 mg total) by mouth daily with breakfast. Swallow whole. 150 tablet 2  . Continuous Blood Gluc Receiver (DEXCOM G6 RECEIVER) DEVI 1 Piece by Does not apply route as needed. 1 Device 0  . Continuous Blood Gluc Sensor (DEXCOM G6 SENSOR) MISC 4 Pieces by Does not apply route once a week. 4 each 2  . EUTHYROX 137 MCG tablet TAKE 1 TABLET BY MOUTH ONCE DAILY BEFORE BREAKFAST 90 tablet 0  . fluticasone furoate-vilanterol (BREO ELLIPTA) 200-25 MCG/INH AEPB Inhale 1 puff by mouth once daily 180 each 3  . fluticasone furoate-vilanterol (BREO ELLIPTA) 200-25 MCG/INH AEPB Inhale 1 puff into the lungs daily. 2 each 0  . Insulin Pen Needle (B-D ULTRAFINE III SHORT PEN) 31G X 8 MM MISC USING THREE  TIMES DAILY. 100 each 5  . insulin regular human CONCENTRATED (HUMULIN R U-500 KWIKPEN) 500 UNIT/ML kwikpen Inject 75-85 Units into the skin 3 (three) times daily with meals. Inject 85 units with breakfast and lunch, and 65 units with supper if glucose is above 90 and you are eating  60 mL 2  . isosorbide mononitrate (IMDUR) 60 MG 24 hr tablet TAKE 1 TABLET BY MOUTH IN THE MORNING AND 1/2 (ONE-HALF) IN THE EVENING (Patient taking differently: Take 30-60 mg by mouth in the morning and at bedtime. 1 tablet in am, half a tablet in the evening) 135 tablet 3  . loratadine (CLARITIN) 10 MG tablet Take 10 mg by mouth daily as needed for allergies.     [name] losartan (COZAAR) 50 MG tablet Take 1 tablet (50 mg total) by mouth daily. 90 tablet 0  . metFORMIN (GLUCOPHAGE) 500 MG tablet TAKE 1 TABLET BY MOUTH TWICE DAILY WITH MEALS 180 tablet 0  . metoprolol succinate (TOPROL XL) 25 MG 24 hr  tablet Take 1 tablet (25 mg total) by mouth See admin instructions. -Take 1 tablet (25 mg) along with an additional 50 mg tablet for total of 75 mg twice daily 180 tablet 2  . metoprolol succinate (TOPROL-XL) 50 MG 24 hr tablet Take 1 tablet (50 mg total) by mouth 2 (two) times daily. -Take 1 tablet (50 mg) along with an additional 25 mg tablet for total of 75 mg twice daily 180 tablet 3  . modafinil (PROVIGIL) 200 MG tablet Take 1 tablet (200 mg total) by mouth daily. 30 tablet 5  . nitroGLYCERIN (NITROSTAT) 0.4 MG SL tablet DISSOLVE 1 TABLET UNDER TONGUE EVERY 5 MINUTES UP TO 15 MIN FOR CHESTPAIN. IF NO RELIEF CALL 911. 25 tablet 3  . ONE TOUCH ULTRA TEST test strip TEST BLOOD SUGAR UP TO 4 TIMES DAILY. 150 each 5  . ONETOUCH DELICA LANCETS 16R MISC USE AS DIRECTED TO TEST BLOOD SUGAR 4 TIMES DAILY. 150 each 5  . pravastatin (PRAVACHOL) 40 MG tablet Take 1 tablet (40 mg total) by mouth every evening. 90 tablet 1  . rOPINIRole (REQUIP XL) 4 MG 24 hr tablet Take 1 tablet (4 mg total) by mouth at bedtime. 90 tablet 1  . rOPINIRole (REQUIP) 4 MG tablet Take 0.5 tablets (2 mg total) by mouth 2 (two) times daily. 90 tablet 1  . Vitamin D, Ergocalciferol, (DRISDOL) 1.25 MG (50000 UNIT) CAPS capsule Take 1 capsule by mouth once a week 12 capsule 0  . benzonatate (TESSALON) 100 MG capsule Take 1 capsule (100 mg  total) by mouth 2 (two) times daily as needed for cough. (Patient not taking: Reported on 06/03/2020) 20 capsule 0  . predniSONE (DELTASONE) 20 MG tablet Take 1 tablet (20 mg total) by mouth daily with breakfast. 5 tablet 0   No facility-administered medications prior to visit.     Review of Systems:   Constitutional:   No  weight loss, night sweats,  Fevers, chills,  +fatigue, or  lassitude.  HEENT:   No headaches,  Difficulty swallowing,  Tooth/dental problems, or  Sore throat,                No sneezing, itching, ear ache, nasal congestion, post nasal drip,   CV:  No chest pain,  Orthopnea, PND,  anasarca, dizziness, palpitations, syncope.   GI  No heartburn, indigestion, abdominal pain, nausea, vomiting, diarrhea, change in bowel habits, loss of appetite, bloody stools.   Resp:    No wheezing.  No chest wall deformity  Skin: no rash or lesions.  GU: no dysuria, change in color of urine, no urgency or frequency.  No flank pain, no hematuria   MS:  No joint pain or swelling.  No decreased range of motion.  No back pain.    Physical Exam  BP 124/70 (BP Location: Left Arm, Patient Position: Sitting, Cuff Size: Large)   Pulse 72   Temp (!) 97.2 F (36.2 C) (Temporal)   Ht 5\' 5"  (1.651 m)   Wt 233 lb 6.4 oz (105.9 kg)   SpO2 97%   BMI 38.84 kg/m   GEN: A/Ox3; pleasant , NAD, well nourished    HEENT:  [place]/AT,   NOSE-clear, THROAT-clear, no lesions, no postnasal drip or exudate noted.   NECK:  Supple w/ fair ROM; no JVD; normal carotid impulses w/o bruits; no thyromegaly or nodules palpated; no lymphadenopathy.    RESP  Clear  P & A; w/o, wheezes/ rales/ or rhonchi. no accessory muscle use,  no dullness to percussion  CARD:  RRR, no m/r/g, tr-1  peripheral edema, pulses intact, no cyanosis or clubbing. VI changes   GI:   Soft & nt; nml bowel sounds; no organomegaly or masses detected.   Musco: Warm bil, no deformities or joint swelling noted.   Neuro: alert, no focal  deficits noted.    Skin: Warm, no lesions or rashes    Lab Results:  CBC    Component Value Date/Time   WBC 10.0 05/23/2020 2150   RBC 3.95 (L) 05/23/2020 2150   HGB 11.8 (L) 05/23/2020 2150   HGB 11.8 (L) 09/11/2018 0949   HCT 36.4 (L) 05/23/2020 2150   HCT 35.6 (L) 09/11/2018 0949   PLT 274 05/23/2020 2150   PLT 199 09/11/2018 0949   MCV 92.2 05/23/2020 2150   MCV 91 09/11/2018 0949   MCH 29.9 05/23/2020 2150   MCHC 32.4 05/23/2020 2150   RDW 14.0 05/23/2020 2150   RDW 13.8 09/11/2018 0949   LYMPHSABS 1.4 05/23/2020 2150   LYMPHSABS 1.2 09/11/2018 0949   MONOABS 0.9 05/23/2020 2150   EOSABS 0.0 05/23/2020 2150   EOSABS 0.3 09/11/2018 0949   BASOSABS 0.0 05/23/2020 2150   BASOSABS 0.1 09/11/2018 0949    BMET    Component Value Date/Time   NA 137 05/23/2020 2150   NA 138 03/10/2020 0805   K 3.8 05/23/2020 2150   CL 103 05/23/2020 2150   CO2 24 05/23/2020 2150   GLUCOSE 196 (H) 05/23/2020 2150   BUN 37 (H) 05/23/2020 2150   BUN 22 03/10/2020 0805   CREATININE 1.43 (H) 05/23/2020 2150   CREATININE 1.11 02/06/2019 0813   CALCIUM 9.3 05/23/2020 2150   GFRNONAA 53 (L) 05/23/2020 2150   GFRNONAA 68 02/06/2019 0813   GFRAA 87 12/04/2019 0832   GFRAA 79 02/06/2019 0813    BNP    Component Value Date/Time   BNP 56.0 05/23/2020 2150    ProBNP    Component Value Date/Time   PROBNP 100.0 05/21/2020 1054    Imaging: DG Chest 2 View  Result Date: 06/03/2020 CLINICAL DATA:  Follow-up COVID pneumonia. EXAM: CHEST - 2 VIEW COMPARISON:  CT May 23, 2020, chest radiograph May 21, 2020 FINDINGS: Similar cardiomediastinal silhouette. Hazy left basilar opacity correlates with prominent epicardial/diaphragmatic fat on prior CT chest. No consolidation. Prominent nipple shadow on the left, similar to priors. No visible pleural effusions or pneumothorax. No acute osseous abnormality. Polyarticular degenerative change. IMPRESSION: No evidence of acute cardiopulmonary disease.  Electronically Signed   By: Margaretha Sheffield MD   On: 06/03/2020 11:20   DG Chest 2 View  Result Date: 05/21/2020 CLINICAL DATA:  Asthmatic bronchitis.  Reported COVID-19 positive EXAM: CHEST - 2 VIEW COMPARISON:  February 18, 2020 FINDINGS: Subtle areas of ill-defined opacity in the right upper and right lower lobe regions noted. Lungs elsewhere are clear. No consolidation. Heart size and pulmonary vascularity are normal. No adenopathy. No bone lesions. IMPRESSION: Subtle opacity in the right upper lobe and right lower lung regions, potentially representing foci of atypical organism pneumonia. No consolidation. Lungs otherwise clear. Cardiac silhouette normal. No adenopathy evident. Electronically Signed   By: Lowella Grip III M.D.   On: 05/21/2020 11:24   CT Angio Chest PE W and/or Wo Contrast  Result Date: 05/23/2020 CLINICAL DATA:  Subacute COVID infection, progressive dyspnea EXAM: CT ANGIOGRAPHY CHEST WITH CONTRAST TECHNIQUE: Multidetector CT imaging of the chest was performed using the standard protocol during bolus administration of intravenous contrast.  Multiplanar CT image reconstructions and MIPs were obtained to evaluate the vascular anatomy. CONTRAST:  178mL OMNIPAQUE IOHEXOL 350 MG/ML SOLN COMPARISON:  12/19/2018 FINDINGS: Cardiovascular: Adequate opacification of the pulmonary arterial tree. No intraluminal filling defect identified to suggest acute pulmonary embolism. Mild coronary artery calcification. Global cardiac size within normal limits. No pericardial effusion. Central pulmonary arteries are of normal caliber. Mild atherosclerotic calcification noted within the thoracic aorta. No aortic aneurysm. Mediastinum/Nodes: No enlarged mediastinal, hilar, or axillary lymph nodes. Thyroid gland, trachea, and esophagus demonstrate no significant findings. Lungs/Pleura: Lungs are clear. No pleural effusion or pneumothorax. Upper Abdomen: No acute abnormality. Musculoskeletal: Osseous  structures are age-appropriate. No acute bone abnormality. Review of the MIP images confirms the above findings. IMPRESSION: No pulmonary embolism.  No acute intrathoracic pathology identified. Mild coronary artery calcification. Aortic Atherosclerosis (ICD10-I70.0). Electronically Signed   By: Fidela Salisbury MD   On: 05/23/2020 23:20      PFT Results Latest Ref Rng & Units 11/21/2018  FVC-Pre L 2.53  FVC-Predicted Pre % 68  FVC-Post L 2.68  FVC-Predicted Post % 72  Pre FEV1/FVC % % 81  Post FEV1/FCV % % 82  FEV1-Pre L 2.05  FEV1-Predicted Pre % 74  FEV1-Post L 2.19  DLCO uncorrected ml/min/mmHg 15.90  DLCO UNC% % 70  DLVA Predicted % 97  TLC L 4.48  TLC % Predicted % 74  RV % Predicted % 81    No results found for: NITRICOXIDE      Assessment & Plan:   Asthma Recent exacerbation with COVID-19 infection now improving Continue to follow on Breo patient assistance paperwork  Plan  Patient Instructions   Mucinex DM Twice daily  As needed  Cough and congestion  Continue on BREO 1 puff daily, rinse after use. (call back if not covered )  Albuterol inhaler As needed   Low salt diet , TED hose  Follow up with Cardiology as discussed  Follow up with Dr. Halford Chessman next month as planned and As needed   Please contact office for sooner follow up if symptoms do not improve or worsen or seek emergency care         COVID-19 virus infection Recent COVID-19 infection now resolving.  Patient is starting to make clinical improvement.  Continue to follow closely  Grade I diastolic dysfunction Diastolic dysfunction.-During recent flare with suspected mild decompensation.  Have to use caution with chronic diuretics as she he has chronic kidney disease.  Have asked him to follow-up with cardiology for ongoing management. Continue on current dose of Lasix Low-salt diet and TED stockings  OSA on CPAP Continue on CPAP at bedtime     Rexene Edison, NP 06/03/2020

## 2020-06-03 NOTE — Patient Instructions (Addendum)
Mucinex DM Twice daily  As needed  Cough and congestion  Continue on BREO 1 puff daily, rinse after use. (call back if not covered )  Albuterol inhaler As needed   Low salt diet , TED hose  Follow up with Cardiology as discussed  Follow up with Dr. Halford Chessman next month as planned and As needed   Please contact office for sooner follow up if symptoms do not improve or worsen or seek emergency care

## 2020-06-03 NOTE — Addendum Note (Signed)
Addended by: Vanessa Barbara on: 06/03/2020 12:35 PM   Modules accepted: Orders

## 2020-06-03 NOTE — Assessment & Plan Note (Signed)
Continue on CPAP at bedtime 

## 2020-06-07 ENCOUNTER — Telehealth: Payer: Self-pay | Admitting: Pharmacy Technician

## 2020-06-07 DIAGNOSIS — R3914 Feeling of incomplete bladder emptying: Secondary | ICD-10-CM | POA: Diagnosis not present

## 2020-06-07 DIAGNOSIS — N5201 Erectile dysfunction due to arterial insufficiency: Secondary | ICD-10-CM | POA: Diagnosis not present

## 2020-06-07 DIAGNOSIS — Z596 Low income: Secondary | ICD-10-CM

## 2020-06-07 DIAGNOSIS — R3912 Poor urinary stream: Secondary | ICD-10-CM | POA: Diagnosis not present

## 2020-06-07 NOTE — Progress Notes (Signed)
[facility])                                            [place] Team    06/07/2020  Paul Baker 1950-06-29 [id number]   Care coordination call placed to [employer] in regards to Eliquis application.  Spoke to [place] at [facility] who informed patient was APPROVED 06/02/20-01/01/21. She informed the medication was delivered to patient's home (front door) on 06/04/20.  Care coordination call placed to [place] in regards to [facility] application.  Spoke to [place] who informed patient needs to submit proof that he has spent $600 OOP this year. The OOP that was submitted was for 2021 and 2022 and did not equal $600. She informs a new OOP is required for processing to continue.  Attempted to outreach patient but voicemail picked up. HIPAA compliant voicemail left requesting return call.  Paul Baker [name], [place]  [phone]

## 2020-06-08 ENCOUNTER — Telehealth: Payer: Self-pay | Admitting: Family Medicine

## 2020-06-08 NOTE — Telephone Encounter (Signed)
Left message for patient to call back and schedule Medicare Annual Wellness Visit (AWV).   Please offer to do virtually or by telephone.   Due for AWVI  Please schedule at anytime with Nurse Health Advisor.   

## 2020-06-09 DIAGNOSIS — R3914 Feeling of incomplete bladder emptying: Secondary | ICD-10-CM | POA: Diagnosis not present

## 2020-06-10 DIAGNOSIS — G4733 Obstructive sleep apnea (adult) (pediatric): Secondary | ICD-10-CM | POA: Diagnosis not present

## 2020-06-14 ENCOUNTER — Encounter: Payer: Self-pay | Admitting: Family Medicine

## 2020-06-14 ENCOUNTER — Other Ambulatory Visit: Payer: Self-pay | Admitting: *Deleted

## 2020-06-14 DIAGNOSIS — G2581 Restless legs syndrome: Secondary | ICD-10-CM

## 2020-06-14 MED ORDER — ROPINIROLE HCL ER 4 MG PO TB24: 4.0000 mg | ORAL_TABLET | Freq: Every day | ORAL | 1 refills | Status: DC

## 2020-06-14 MED ORDER — ROPINIROLE HCL 4 MG PO TABS: 2.0000 mg | ORAL_TABLET | Freq: Two times a day (BID) | ORAL | 1 refills | Status: DC

## 2020-06-15 ENCOUNTER — Telehealth: Payer: Self-pay | Admitting: Adult Health

## 2020-06-15 NOTE — Telephone Encounter (Signed)
Fax received from [employer] stating that PA for [employer]air Patter was denied.  Request was denied because: Requested drug is on your formulary (list of covered drugs) at the preferred tier. Your Medicare Part D drug plan was asked to cover requested drug at a lower cost share (tier). Requested drug is listed at Tier 3, lowest cost share (tier) allowed by your plan for this drug. Plan is unable to offer you a lower cost share for this drug than what is currently being charged.   Attempted to call pt to see if he was able to pick inhaler up from pharmacy after it was last prescribed prior to sending this to TP. Left message for pt to return call so we can further discuss the [facility] inhaler.

## 2020-06-17 NOTE — Telephone Encounter (Signed)
Patient states picking up Breo inhaler at [facility]. Patient phone number is [phone].

## 2020-06-22 ENCOUNTER — Telehealth: Payer: Self-pay | Admitting: Cardiology

## 2020-06-22 ENCOUNTER — Ambulatory Visit: Payer: Medicare HMO | Admitting: Nurse Practitioner

## 2020-06-22 ENCOUNTER — Other Ambulatory Visit: Payer: Self-pay

## 2020-06-22 ENCOUNTER — Encounter: Payer: Self-pay | Admitting: Nurse Practitioner

## 2020-06-22 VITALS — BP 135/80 | HR 81 | Ht 65.0 in | Wt 240.0 lb

## 2020-06-22 DIAGNOSIS — E782 Mixed hyperlipidemia: Secondary | ICD-10-CM

## 2020-06-22 DIAGNOSIS — E1159 Type 2 diabetes mellitus with other circulatory complications: Secondary | ICD-10-CM

## 2020-06-22 DIAGNOSIS — E038 Other specified hypothyroidism: Secondary | ICD-10-CM

## 2020-06-22 DIAGNOSIS — I10 Essential (primary) hypertension: Secondary | ICD-10-CM

## 2020-06-22 LAB — POCT GLYCOSYLATED HEMOGLOBIN (HGB A1C): Hemoglobin A1C: 8.6 % — AB (ref 4.0–5.6)

## 2020-06-22 MED ORDER — DEXCOM G6 RECEIVER DEVI: 1.0000 | 0 refills | Status: DC | PRN

## 2020-06-22 NOTE — Telephone Encounter (Signed)
Patient informed and verbalized understanding of plan. 

## 2020-06-22 NOTE — Telephone Encounter (Signed)
New message     Pt c/o swelling: STAT is pt has developed SOB within 24 hours  If swelling, where is the swelling located all over   How much weight have you gained and in what time span 20 lbs since last wednesday  Have you gained 3 pounds in a day or 5 pounds in a week?  yes  Do you have a log of your daily weights (if so, list)?  Yes - last Wednesday he weighed 226  Thursday 230 Saturday 237 today 240   Are you currently taking a fluid pill? yes  Are you currently SOB? Yes, having a hard time breathing   Have you traveled recently? no

## 2020-06-22 NOTE — Telephone Encounter (Signed)
   Covering for Dr. Domenic Polite - I reviewed his last office note from 04/2020 and he was not listed as taking Lasix at that time (weight was at 230 lbs). Certainly agree with recommendations to have him reach out to Nephrology given his underlying CKD. If he does not hear back from them in regards to recommendations today, would have him take 80mg  for at least the next 3 days but if they provide different recommendations in the interm, please follow those. I would recommend he follow his HR at home as tachycardia could be contributing to his his issues. May need to place a monitor in the future to assess his atrial fibrillation burden if he continues to have episodes of tachycardia. Would recommend that he continue to limit sodium intake and to limit fluid intake to less than 2L daily. Agree with recommendations to go to the ED if symptoms progress as he may require admission for IV diuresis.   Signed, Erma Heritage, PA-C 06/22/2020, 11:39 AM Pager: [phone]

## 2020-06-22 NOTE — Telephone Encounter (Signed)
Reports swelling all over body especially abdomen and legs. Also reports worsening SOB and not sleeping well due to SOB and swelling. Reports chest pain rated 8/10 on Saturday with fluctuating HR as high as 142. Reports using nitroglycerin x's 2 on Saturday and chest pain was relieved. Reports having dizziness all the time. Denies active chest pain. Weight was 230 lbs on 06/18/20 and today 241 lbs. Reports lasix taking 40 mg daily alternating with 80 mg prescribed Dr. Moshe Cipro (nephrologist) [facility] for the past year. Says he does not take potassium. Did not check BP but HR was 90 yesterday and did not check vitals today. Advised that message would be sent to the covering provider. Advised to contact his nephrologist as well with symptoms of swelling and weight gain. Advised if symptoms get worse, to go to the ED for an evaluation. Verbalized understanding of plan.

## 2020-06-22 NOTE — Patient Instructions (Signed)

## 2020-06-22 NOTE — Progress Notes (Signed)
06/22/2020   Endocrinology follow-up note   Subjective:    Patient ID: Paul Baker, male    DOB: 1950/09/15, PCP Erven Colla, DO   Past Medical History:  Diagnosis Date   Arthritis    Asthma    Atrial fibrillation [facility])    Diagnosed December 2021   Colon polyp    Coronary atherosclerosis of native coronary artery    a. 2011: cath showing 90% stenosis along small non-dominant RCA (too small for PCI). b. 01/2018: cath showing nonobstructive CAD with 60 to 70% proximal to mid nondominant RCA stenosis, 50% mid LAD and 40 to 50% OM1   DVT (deep venous thrombosis) ([place]) 2005   Right arm   Essential hypertension    Headache    History of transfusion    Hypothyroidism    Mixed hyperlipidemia    Morbid obesity ([place])    MRSA (methicillin resistant staph aureus) culture positive    08/2012   Narcolepsy    OSA (obstructive sleep apnea)    CPAP   Osteoarthritis    Pneumonia    Psoriasis    Pulmonary embolism ([place]) 2004   RLS (restless legs syndrome)    Rotator cuff disorder    Left   Septic arthritis of knee, left (HCC)    Skin cancer, basal cell    Type 2 diabetes mellitus ([place])    Past Surgical History:  Procedure Laterality Date   BACK SURGERY     CATARACT EXTRACTION Bilateral    COLONOSCOPY     CYST REMOVAL TRUNK     from back   EYE SURGERY Left 2016   laser to left eye   HIP SURGERY     bone removed from both sides of hip   KNEE ARTHROTOMY Right 12/04/2014   Procedure: KNEE ARTHROTOMY PATELLA LIGAMENT RECONSTRUSION AND REPAIR RIGHT KNEE;  Surgeon: Paralee Cancel, MD;  Location: [place];  Service: Orthopedics;  Laterality: Right;   KNEE SURGERY     X 25 TIMES   LEFT HEART CATH AND CORONARY ANGIOGRAPHY N/A 01/17/2018   Procedure: LEFT HEART CATH AND CORONARY ANGIOGRAPHY;  Surgeon: Belva Crome, MD;  Location: [place] [facility];  Service: Cardiovascular;  Laterality: N/A;   LUMBAR DISC SURGERY     Left L3, L4, L5 discecotomy with decompression of L4 root    TONSILLECTOMY     TOTAL KNEE ARTHROPLASTY  2003   LEFT   TOTAL KNEE ARTHROPLASTY Right 03/23/2014   Procedure: RIGHT TOTAL KNEE ARTHROPLASTY AND REMOVAL RIGHT TIBIAL  DEEP IMPLANT STAPLE;  Surgeon: Paralee Cancel, MD;  Location: [facility];  Service: Orthopedics;  Laterality: Right;   TOTAL KNEE REVISION  2005   LEFT   WRIST SURGERY     Social History   Socioeconomic History   Marital status: Married    Spouse name: Oleta   Number of children: 2   Years of education: college   Highest education level: Not on file  Occupational History   Occupation: Disabled    Employer: UNEMPLOYED  Tobacco Use   Smoking status: Former    Packs/day: 1.50    Years: 10.00    Pack years: 15.00    Types: Cigarettes    Start date: 06/19/1967    Quit date: 01/03/1995    Years since quitting: 25.4   Smokeless tobacco: Never  Vaping Use   Vaping Use: Never used  Substance and Sexual Activity   Alcohol use: No    Alcohol/week: 0.0 standard drinks  Comment: quit drinking in 07/86   Drug use: No   Sexual activity: Yes    Partners: Female  Other Topics Concern   Not on file  Social History Narrative    70 year old, right-handed, caucasian male with a past medical history of obesity, hypertension, hyperlipidemia, diabetes, obstructive sleep apnea, presenting with frequent nighttime awakenings, excessive daytime sleepiness, also transient confusional episodes.RLS and one beosity, OSA on CPAP with AHI of 3.2 and  setting of 16 cm water , [facility] .   Social Determinants of Health   Financial Resource Strain: Not on file  Food Insecurity: Not on file  Transportation Needs: Not on file  Physical Activity: Not on file  Stress: Not on file  Social Connections: Not on file   Outpatient Encounter Medications as of 06/22/2020  Medication Sig   acetaminophen (TYLENOL) 325 MG tablet Take 2 tablets (650 mg total) by mouth every 6 (six) hours as needed for mild pain (or Fever >/= 101).   albuterol  (VENTOLIN HFA) 108 (90 Base) MCG/ACT inhaler Inhale 2 puffs into the lungs every 6 (six) hours as needed for wheezing or shortness of breath.   amLODipine (NORVASC) 5 MG tablet Take 1 tablet (5 mg total) by mouth daily.   apixaban (ELIQUIS) 5 MG TABS tablet Take 1 tablet (5 mg total) by mouth 2 (two) times daily. For stroke prevention   aspirin EC 81 MG tablet Take 1 tablet (81 mg total) by mouth daily with breakfast. Swallow whole.   benzonatate (TESSALON) 100 MG capsule Take 1 capsule (100 mg total) by mouth 2 (two) times daily as needed for cough. (Patient not taking: Reported on 06/03/2020)   Continuous Blood Gluc Receiver (DEXCOM G6 RECEIVER) DEVI 1 Piece by Does not apply route as needed.   Continuous Blood Gluc Sensor (DEXCOM G6 SENSOR) MISC 4 Pieces by Does not apply route once a week.   EUTHYROX 137 MCG tablet TAKE 1 TABLET BY MOUTH ONCE DAILY BEFORE BREAKFAST   fluticasone furoate-vilanterol (BREO ELLIPTA) 200-25 MCG/INH AEPB Inhale 1 puff by mouth once daily   Insulin Pen Needle (B-D ULTRAFINE III SHORT PEN) 31G X 8 MM MISC USING THREE  TIMES DAILY.   insulin regular human CONCENTRATED (HUMULIN R U-500 KWIKPEN) 500 UNIT/ML kwikpen Inject 75-85 Units into the skin 3 (three) times daily with meals. Inject 85 units with breakfast and lunch, and 65 units with supper if glucose is above 90 and you are eating   isosorbide mononitrate (IMDUR) 60 MG 24 hr tablet TAKE 1 TABLET BY MOUTH IN THE MORNING AND 1/2 (ONE-HALF) IN THE EVENING (Patient taking differently: Take 30-60 mg by mouth in the morning and at bedtime. 1 tablet in am, half a tablet in the evening)   loratadine (CLARITIN) 10 MG tablet Take 10 mg by mouth daily as needed for allergies.    losartan (COZAAR) 50 MG tablet Take 1 tablet (50 mg total) by mouth daily.   metFORMIN (GLUCOPHAGE) 500 MG tablet TAKE 1 TABLET BY MOUTH TWICE DAILY WITH MEALS   metoprolol succinate (TOPROL XL) 25 MG 24 hr tablet Take 1 tablet (25 mg total) by mouth See  admin instructions. -Take 1 tablet (25 mg) along with an additional 50 mg tablet for total of 75 mg twice daily   metoprolol succinate (TOPROL-XL) 50 MG 24 hr tablet Take 1 tablet (50 mg total) by mouth 2 (two) times daily. -Take 1 tablet (50 mg) along with an additional 25 mg tablet for total of 75 mg  twice daily   modafinil (PROVIGIL) 200 MG tablet Take 1 tablet (200 mg total) by mouth daily.   nitroGLYCERIN (NITROSTAT) 0.4 MG SL tablet DISSOLVE 1 TABLET UNDER TONGUE EVERY 5 MINUTES UP TO 15 MIN FOR CHESTPAIN. IF NO RELIEF CALL 911.   ONE TOUCH ULTRA TEST test strip TEST BLOOD SUGAR UP TO 4 TIMES DAILY.   ONETOUCH DELICA LANCETS 21H MISC USE AS DIRECTED TO TEST BLOOD SUGAR 4 TIMES DAILY.   pravastatin (PRAVACHOL) 40 MG tablet Take 1 tablet (40 mg total) by mouth every evening.   rOPINIRole (REQUIP XL) 4 MG 24 hr tablet Take 1 tablet (4 mg total) by mouth at bedtime.   rOPINIRole (REQUIP) 4 MG tablet Take 0.5 tablets (2 mg total) by mouth 2 (two) times daily.   Vitamin D, Ergocalciferol, (DRISDOL) 1.25 MG (50000 UNIT) CAPS capsule Take 1 capsule by mouth once a week   [DISCONTINUED] Continuous Blood Gluc Receiver (DEXCOM G6 RECEIVER) DEVI 1 Piece by Does not apply route as needed.   No facility-administered encounter medications on file as of 06/22/2020.   ALLERGIES: Allergies  Allergen Reactions   Tape Rash    Pulls off skin   Cardizem [Diltiazem Hcl]     Edema    Cardura [Doxazosin Mesylate]     Headaches / cramps   Lipitor [Atorvastatin] Other (See Comments)    Leg cramps   Paxil [Paroxetine Hcl]     Unknown reaction    Codeine Rash and Other (See Comments)    Headache    Gabapentin Rash   VACCINATION STATUS: Immunization History  Administered Date(s) Administered   Influenza Split 09/18/2012   Influenza,inj,Quad PF,6+ Mos 10/13/2013, 10/06/2014, 09/11/2018   Influenza-Unspecified 12/01/2015, 11/08/2016   PFIZER(Purple Top)SARS-COV-2 Vaccination 03/06/2019, 03/27/2019    Pneumococcal Polysaccharide-23 10/02/2004, 10/03/2011   Td 05/24/2006    Diabetes He presents for his follow-up diabetic visit. He has type 2 diabetes mellitus. Onset time: Was diagnosed at approximate age of 59 years. His disease course has been improving. Hypoglycemia symptoms include sweats. Pertinent negatives for hypoglycemia include no confusion, headaches, nervousness/anxiousness, pallor, seizures or tremors. Associated symptoms include fatigue, foot paresthesias and polyuria. Pertinent negatives for diabetes include no chest pain, no polydipsia, no polyphagia, no weakness and no weight loss. There are no hypoglycemic complications. Symptoms are stable. Diabetic complications include a CVA, heart disease, nephropathy and peripheral neuropathy. (Has had 2 mini strokes since last visit.  Been hospitalized for Afib as well) Risk factors for coronary artery disease include diabetes mellitus, dyslipidemia, male sex, obesity, sedentary lifestyle and tobacco exposure. Current diabetic treatment includes oral agent (dual therapy), intensive insulin program and oral agent (monotherapy). He is compliant with treatment most of the time. His weight is increasing rapidly. He is following a generally unhealthy diet. When asked about meal planning, he reported none. He has had a previous visit with a dietitian. He never participates in exercise. His home blood glucose trend is decreasing steadily. His breakfast blood glucose range is generally 140-180 mg/dl. His lunch blood glucose range is generally >200 mg/dl. His dinner blood glucose range is generally 140-180 mg/dl. His bedtime blood glucose range is generally >200 mg/dl. (He presents today with his CGM, no logs, showing improved glycemic profile overall.  His POCT A1c today is 8.6%, improving from last visit of 9.7%.  Analysis of his CGM shows TIR 50%, TAR 48%, TBR 2%.  He has not had as much nocturnal hypoglycemia as previously.  He stopped his Glipizide since  last visit  by recommendations from his doctor (he was unsure of which one or why).  He has been having trouble with fluid retention, reports a 20 lb weight gain over the last week which is causing him to be SOB and have leg pain. ) An ACE inhibitor/angiotensin II receptor blocker is being taken. He sees a podiatrist.Eye exam is current.  Hyperlipidemia This is a chronic problem. The current episode started more than 1 year ago. The problem is uncontrolled. Recent lipid tests were reviewed and are high. Exacerbating diseases include chronic renal disease, diabetes, hypothyroidism and obesity. There are no known factors aggravating his hyperlipidemia. Pertinent negatives include no chest pain, myalgias or shortness of breath. Current antihyperlipidemic treatment includes statins. The current treatment provides mild improvement of lipids. Compliance problems include adherence to diet and adherence to exercise.  Risk factors for coronary artery disease include dyslipidemia, diabetes mellitus, hypertension, male sex, obesity and a sedentary lifestyle.  Hypertension This is a chronic problem. The current episode started more than 1 year ago. The problem has been gradually improving since onset. The problem is uncontrolled. Associated symptoms include sweats. Pertinent negatives include no chest pain, headaches, neck pain, palpitations or shortness of breath. Agents associated with hypertension include thyroid hormones. Risk factors for coronary artery disease include diabetes mellitus, dyslipidemia, male gender, obesity, sedentary lifestyle and smoking/tobacco exposure. Past treatments include angiotensin blockers, beta blockers and diuretics. Compliance problems include exercise and diet.  Hypertensive end-organ damage includes kidney disease, CAD/MI and CVA. Identifiable causes of hypertension include chronic renal disease and a thyroid problem.  Thyroid Problem Presents for follow-up visit. Symptoms include  fatigue and leg swelling. Patient reports no anxiety, cold intolerance, constipation, depressed mood, diarrhea, heat intolerance, palpitations, tremors, weight gain or weight loss. The symptoms have been stable. Past treatments include levothyroxine. His past medical history is significant for diabetes and hyperlipidemia.    Review of systems  Constitutional: + rapidly increasing body weight,  current Body mass index is 39.94 kg/m. , no fatigue, no subjective hyperthermia, no subjective hypothermia, reports mild memory impairment since mini strokes Eyes: no blurry vision, no xerophthalmia ENT: no sore throat, no nodules palpated in throat, no dysphagia/odynophagia, no hoarseness Cardiovascular: no chest pain, no shortness of breath, no palpitations, ++ leg swelling (reports he is reaching out to his cardiologist and nephrologist regarding this) Respiratory: no cough, no shortness of breath Gastrointestinal: no nausea/vomiting/diarrhea Musculoskeletal: no muscle/joint aches Skin: no rashes, no hyperemia Neurological: no tremors, no numbness, no tingling, no dizziness, + intermittent numbness/tingling to bilateral feet Psychiatric: no depression, no anxiety    Objective:    BP 135/80   Pulse 81   Ht 5\' 5"  (1.651 m)   Wt 240 lb (108.9 kg)   BMI 39.94 kg/m   Wt Readings from Last 3 Encounters:  06/22/20 240 lb (108.9 kg)  06/03/20 233 lb 6.4 oz (105.9 kg)  05/26/20 228 lb (103.4 kg)     BP Readings from Last 3 Encounters:  06/22/20 135/80  06/03/20 124/70  05/26/20 (!) 150/78     Physical Exam- Limited  Constitutional:  Body mass index is 39.94 kg/m. , not in acute distress, normal state of mind Eyes:  EOMI, no exophthalmos Neck: Supple Cardiovascular: Irregular (hx afib), no murmurs, rubs, or gallops, 2+ pitting edema to BLE Respiratory: Adequate breathing efforts, no crackles, rales, rhonchi, or wheezing Musculoskeletal: no gross deformities, strength intact in all four  extremities, no gross restriction of joint movements Skin:  no rashes, no hyperemia Neurological: no  tremor with outstretched hands     CMP Latest Ref Rng & Units 05/23/2020 05/21/2020 03/10/2020  Glucose 70 - 99 mg/dL 196(H) 298(H) 266(H)  BUN 8 - 23 mg/dL 37(H) 29(H) 22  Creatinine 0.61 - 1.24 mg/dL 1.43(H) 1.08 1.08  Sodium 135 - 145 mmol/L 137 137 138  Potassium 3.5 - 5.1 mmol/L 3.8 4.5 4.6  Chloride 98 - 111 mmol/L 103 103 101  CO2 22 - 32 mmol/L 24 24 20   Calcium 8.9 - 10.3 mg/dL 9.3 9.4 9.4  Total Protein 6.5 - 8.1 g/dL 6.9 - 6.6  Total Bilirubin 0.3 - 1.2 mg/dL 0.4 - 0.3  Alkaline Phos 38 - 126 U/L 50 - 75  AST 15 - 41 U/L 23 - 16  ALT 0 - 44 U/L 23 - 14     Diabetic Labs (most recent): Lab Results  Component Value Date   HGBA1C 8.6 (A) 06/22/2020   HGBA1C 9.7 (A) 03/18/2020   HGBA1C 8.7 (H) 12/05/2019   Lipid Panel     Component Value Date/Time   CHOL 177 08/05/2019 0334   CHOL 148 08/19/2015 0914   TRIG 95 08/05/2019 0334   HDL 56 08/05/2019 0334   HDL 53 08/19/2015 0914   CHOLHDL 3.2 08/05/2019 0334   VLDL 19 08/05/2019 0334   LDLCALC 102 (H) 08/05/2019 0334   LDLCALC 118 (H) 02/06/2019 0813     Assessment & Plan:   1) Uncontrolled type 2 diabetes mellitus with circulatory complication, with long-term current use of insulin (HCC)  -His diabetes is complicated by coronary artery disease and patient remains at an extremely high risk for more acute and chronic complications of diabetes which include CAD, CVA, CKD, retinopathy, and neuropathy. These are all discussed in detail with the patient.  He presents today with his CGM, no logs, showing improved glycemic profile overall.  His POCT A1c today is 8.6%, improving from last visit of 9.7%.  Analysis of his CGM shows TIR 50%, TAR 48%, TBR 2%.  He has not had as much nocturnal hypoglycemia as previously.  He stopped his Glipizide since last visit by recommendations from his doctor (he was unsure of which one or  why).  He has been having trouble with fluid retention, reports a 20 lb weight gain over the last week which is causing him to be SOB and have leg pain.     Glucose logs and insulin administration records pertaining to this visit,  to be scanned into patient's records.  Recent labs reviewed.  - Nutritional counseling repeated at each appointment due to patients tendency to fall back in to old habits.  - The patient admits there is a room for improvement in their diet and drink choices. -  Suggestion is made for the patient to avoid simple carbohydrates from their diet including Cakes, Sweet Desserts / Pastries, Ice Cream, Soda (diet and regular), Sweet Tea, Candies, Chips, Cookies, Sweet Pastries, Store Bought Juices, Alcohol in Excess of 1-2 drinks a day, Artificial Sweeteners, Coffee Creamer, and "Sugar-free" Products. This will help patient to have stable blood glucose profile and potentially avoid unintended weight gain.   - I encouraged the patient to switch to unprocessed or minimally processed complex starch and increased protein intake (animal or plant source), fruits, and vegetables.   - Patient is advised to stick to a routine mealtimes to eat 3 meals a day and avoid unnecessary snacks (to snack only to correct hypoglycemia).  - I have approached patient with the following individualized plan  to manage diabetes and patient agrees.  -He will continue to need intensive treatment with higher dose of insulin.  He has been better with U500 versus [place] and regular insulin.     -Based on his improved glycemic profile and lack of hypoglycemia, he is advised to continue current regimen consisting of U-500 85 units with breakfast and lunch and 65 units with supper if glucose is above 90 and he is eating.  He can also continue Metformin 500 mg twice daily with meals for now (will monitor kidney function closely and reduce/discontinue if needed).   -He is encouraged to continue using his CGM to  monitor glucose 4 times daily, before meals and before bed, and to call the clinic if he has readings less than 70 or greater than 300 for 3 tests in a row.  2) BP/HTN:  His blood pressure is controlled to target.  He is advised to continue Lasix 40 mg po daily, Losartan 50 mg po daily, Norvasc 5 mg po daily, and Metoprolol 75 mg po twice daily.  3) Lipids/HPL:  His most recent lipid panel from 08/05/19 shows uncontrolled LDL of 102 (improving).  He is advised to continue Pravastatin 40 mg po daily at bedtime.  Side effects and precautions discussed with him.  Will recheck lipid panel prior to next visit.  4)  Weight/Diet:  His Body mass index is 68.12 kg/m.-complicating his diabetes care.  He is a candidate for modest weight loss.  CDE consult in progress, exercise, and carbohydrates information provided.  5) Hypothyroidism -He does not have recent TFTs to review.  He is advised to continue current dose of Levothyroxine at 137 mcg po daily before breakfast.  Will recheck TFTs prior to next visit and adjust dose if needed.    - We discussed about the correct intake of his thyroid hormone, on empty stomach at fasting, with water, separated by at least 30 minutes from breakfast and other medications,  and separated by more than 4 hours from calcium, iron, multivitamins, acid reflux medications (PPIs). -Patient is made aware of the fact that thyroid hormone replacement is needed for life, dose to be adjusted by periodic monitoring of thyroid function tests.  6) Chronic Care/Health Maintenance: -Patient is on ACEI/ARB and Statin medications and encouraged to continue to follow up with Ophthalmology, Podiatrist at least yearly or according to recommendations, and advised to  stay away from smoking. I have recommended yearly flu vaccine and pneumonia vaccination at least every 5 years; moderate intensity exercise for up to 150 minutes weekly; and  sleep for at least 7 hours a day.  - I advised patient  to maintain close follow up with Erven Colla, DO for primary care needs.    I spent 40 minutes in the care of the patient today including review of labs from [place], Lipids, Thyroid Function, Hematology (current and previous including abstractions from other facilities); face-to-face time discussing  his blood glucose readings/logs, discussing hypoglycemia and hyperglycemia episodes and symptoms, medications doses, his options of short and long term treatment based on the latest standards of care / guidelines;  discussion about incorporating lifestyle medicine;  and documenting the encounter.    Please refer to Patient Instructions for Blood Glucose Monitoring and Insulin/Medications Dosing Guide"  in media tab for additional information. Please  also refer to " Patient Self Inventory" in the Media  tab for reviewed elements of pertinent patient history.  Paul Baker participated in the discussions, expressed understanding, and voiced  agreement with the above plans.  All questions were answered to his satisfaction. he is encouraged to contact clinic should he have any questions or concerns prior to his return visit.   Follow up plan: -Return in about 3 months (around 09/22/2020) for Diabetes F/U- A1c and UM in office, Thyroid follow up, Previsit labs, Bring meter and logs.  Rayetta Pigg, [facility] [address] [place], [place] [address] Phone: [phone] Fax: [phone]  06/22/2020, 10:31 AM

## 2020-06-24 DIAGNOSIS — R339 Retention of urine, unspecified: Secondary | ICD-10-CM | POA: Diagnosis not present

## 2020-06-28 DIAGNOSIS — E1110 Type 2 diabetes mellitus with ketoacidosis without coma: Secondary | ICD-10-CM | POA: Diagnosis not present

## 2020-07-01 DIAGNOSIS — E1110 Type 2 diabetes mellitus with ketoacidosis without coma: Secondary | ICD-10-CM | POA: Diagnosis not present

## 2020-07-08 DIAGNOSIS — E1110 Type 2 diabetes mellitus with ketoacidosis without coma: Secondary | ICD-10-CM | POA: Diagnosis not present

## 2020-07-10 DIAGNOSIS — G4733 Obstructive sleep apnea (adult) (pediatric): Secondary | ICD-10-CM | POA: Diagnosis not present

## 2020-07-13 ENCOUNTER — Ambulatory Visit: Payer: Medicare HMO | Admitting: Pulmonary Disease

## 2020-07-13 ENCOUNTER — Other Ambulatory Visit: Payer: Self-pay

## 2020-07-13 ENCOUNTER — Encounter: Payer: Self-pay | Admitting: Pulmonary Disease

## 2020-07-13 VITALS — BP 128/60 | HR 70 | Temp 97.7°F | Ht 65.0 in | Wt 235.6 lb

## 2020-07-13 DIAGNOSIS — J4550 Severe persistent asthma, uncomplicated: Secondary | ICD-10-CM

## 2020-07-13 DIAGNOSIS — U099 Post covid-19 condition, unspecified: Secondary | ICD-10-CM | POA: Diagnosis not present

## 2020-07-13 MED ORDER — TRELEGY ELLIPTA 100-62.5-25 MCG/INH IN AEPB: 1.0000 | INHALATION_SPRAY | Freq: Every day | RESPIRATORY_TRACT | 0 refills | Status: DC

## 2020-07-13 MED ORDER — TRELEGY ELLIPTA 100-62.5-25 MCG/INH IN AEPB: 1.0000 | INHALATION_SPRAY | Freq: Every day | RESPIRATORY_TRACT | 5 refills | Status: DC

## 2020-07-13 NOTE — Patient Instructions (Signed)
Stop breo  Trelegy one puff daily, and rinse your mouth after each use  Will arrange for pulmonary function test  Follow up in 2 months

## 2020-07-13 NOTE — Progress Notes (Signed)
[place] [facility]  Chief Complaint  Patient presents with   Follow-up    Patient is still having shortness of breath all the time worse with exertion. Swelling with his whole body and states he is taking 80 of lasix everyday.    Constitutional:  BP 128/60 (BP Location: Left Arm, Patient Position: Sitting, Cuff Size: Large)   Pulse 70   Temp 97.7 F (36.5 C) (Oral)   Ht 5\' 5"  (1.651 m)   Wt 235 lb 9.6 oz (106.9 kg)   SpO2 96%   BMI 39.21 kg/m   Past Medical History:  Rt subclavian vein stenosis, Fatty liver, Spinal stenosis, OSA, Non obstructive CAD, Colon polyps, DVT Rt arm, HA, HTN, Hypothyroidism, HLD, PNA, PE, Psoriasis, RLS, Septic arthritis Lt knee, DM, COVID 19 infection in 2022, Atrial fibrillation  Past Surgical History:  He  has a past surgical history that includes Total knee arthroplasty (2003); Lumbar disc surgery; Wrist surgery; Total knee revision (2005); Colonoscopy; Back surgery; Knee surgery; Total knee arthroplasty (Right, 03/23/2014); Tonsillectomy; Hip surgery; Eye surgery (Left, 2016); Cataract extraction (Bilateral); Knee arthrotomy (Right, 12/04/2014); Cyst removal trunk; and LEFT HEART CATH AND CORONARY ANGIOGRAPHY (N/A, 01/17/2018).  Brief Summary:  Paul Baker is a 70 y.o. male with asthma.      Subjective:   He had COVID few months ago and then had pneumonia.  He has been more short of breath since.  He has cough with clear sputum.  Uses albuterol several times per day and this helps.  Was having swelling.  Had lasix added by nephrology and helped.  Still going in and out of A fib.  Sees cardiology for this.  Has noticed more trouble feeling sleepy during the day.  Hasn't been exercising much since he had COVID.  Physical Exam:   Appearance - well kempt   ENMT - no sinus tenderness, no oral exudate, no LAN, Mallampati 3 airway, no stridor  Respiratory - equal breath sounds bilaterally, no wheezing or  rales  CV - s1s2 regular rate and rhythm, no murmurs  Ext - no clubbing, no edema  Skin - no rashes  Psych - normal mood and affect   Pulmonary testing:  Spirometry 10/17/11 >> 2.58 (89%), FEV1% 85 PFT 09/11/18 >> FEV1 2.19 (79%), FEV1% 82, TLC 4.48 (74%), DLCO 70%, +BD from change in FEF 25-75%  Chest Imaging:  HRCT chest 12/19/18 >> atherosclerosis, patchy air trapping, fatty liver CT angio chest 05/23/20 >> lungs clear  Sleep Tests:  PSG 2012 >> AHI 60  Cardiac Tests:  LHC 01/17/18 >> non obstructive CAD Echo 08/05/19 >> EF 55 to 60%, mild MR  Social History:  He  reports that he quit smoking about 25 years ago. His smoking use included cigarettes. He started smoking about 53 years ago. He has a 15.00 pack-year smoking history. He has never used smokeless tobacco. He reports that he does not drink alcohol and does not use drugs.  Family History:  His family history includes Diabetes in his sister; Heart attack in his father; Hypertension in his father; Kidney Stones in his father; Narcolepsy in his grandchild; Seizures in his grandchild.     Assessment/Plan:   Dyspnea on exertion. - could be related to long COVID - encouraged him to maintain a regular exercise regimen  Severe, persistent asthma. - progressed since having COVID - will have him try trelegy in place of breo - prn albuterol - repeat PFT   Restriction and  diffusion defect on recent PFT. - related to obesity - encouraged him to maintain a diet/exercise/weight loss regimen   Coronary artery disease, Paroxysmal atrial fibrillation. - followed by Dr. Domenic Polite with cardiology   Obstructive sleep apnea, RLS. - followed at [facility]  CKD. - followed by Dr. Moshe Cipro with [facility]  Time Spent Involved in Patient Care on Day of Examination:  32 minutes  Follow up:   Patient Instructions  Stop breo  Trelegy one puff daily, and rinse your mouth after each use  Will arrange  for pulmonary function test  Follow up in 2 months  Medication List:   Allergies as of 07/13/2020       Reactions   Tape Rash   Pulls off skin   Cardizem [diltiazem Hcl]    Edema    Cardura [doxazosin Mesylate]    Headaches / cramps   Lipitor [atorvastatin] Other (See Comments)   Leg cramps   Paxil [paroxetine Hcl]    Unknown reaction    Codeine Rash, Other (See Comments)   Headache    Gabapentin Rash        Medication List        Accurate as of July 13, 2020 11:26 AM. If you have any questions, ask your nurse or doctor.          STOP taking these medications    Breo Ellipta 200-25 MCG/INH Aepb Generic drug: fluticasone furoate-vilanterol Stopped by: Chesley Mires, MD   Vitamin D (Ergocalciferol) 1.25 MG (50000 UNIT) Caps capsule Commonly known as: DRISDOL Stopped by: Chesley Mires, MD       TAKE these medications    acetaminophen 325 MG tablet Commonly known as: TYLENOL Take 2 tablets (650 mg total) by mouth every 6 (six) hours as needed for mild pain (or Fever >/= 101).   albuterol 108 (90 Base) MCG/ACT inhaler Commonly known as: VENTOLIN HFA Inhale 2 puffs into the lungs every 6 (six) hours as needed for wheezing or shortness of breath.   amLODipine 5 MG tablet Commonly known as: NORVASC Take 1 tablet (5 mg total) by mouth daily.   apixaban 5 MG Tabs tablet Commonly known as: ELIQUIS Take 1 tablet (5 mg total) by mouth 2 (two) times daily. For stroke prevention   aspirin EC 81 MG tablet Take 1 tablet (81 mg total) by mouth daily with breakfast. Swallow whole.   B-D ULTRAFINE III SHORT PEN 31G X 8 MM Misc Generic drug: Insulin Pen Needle USING THREE  TIMES DAILY.   benzonatate 100 MG capsule Commonly known as: TESSALON Take 1 capsule (100 mg total) by mouth 2 (two) times daily as needed for cough.   Dexcom G6 Receiver Devi 1 Piece by Does not apply route as needed.   Dexcom G6 Sensor Misc 4 Pieces by Does not apply route once a week.    Euthyrox 137 MCG tablet Generic drug: levothyroxine TAKE 1 TABLET BY MOUTH ONCE DAILY BEFORE BREAKFAST   HumuLIN R U-500 KwikPen 500 UNIT/ML kwikpen Generic drug: insulin regular human CONCENTRATED Inject 75-85 Units into the skin 3 (three) times daily with meals. Inject 85 units with breakfast and lunch, and 65 units with supper if glucose is above 90 and you are eating   isosorbide mononitrate 60 MG 24 hr tablet Commonly known as: IMDUR TAKE 1 TABLET BY MOUTH IN THE MORNING AND 1/2 (ONE-HALF) IN THE EVENING What changed:  how much to take how to take this when to take this additional instructions  loratadine 10 MG tablet Commonly known as: CLARITIN Take 10 mg by mouth daily as needed for allergies.   losartan 50 MG tablet Commonly known as: COZAAR Take 1 tablet (50 mg total) by mouth daily.   metFORMIN 500 MG tablet Commonly known as: GLUCOPHAGE TAKE 1 TABLET BY MOUTH TWICE DAILY WITH MEALS   metoprolol succinate 25 MG 24 hr tablet Commonly known as: Toprol XL Take 1 tablet (25 mg total) by mouth See admin instructions. -Take 1 tablet (25 mg) along with an additional 50 mg tablet for total of 75 mg twice daily   metoprolol succinate 50 MG 24 hr tablet Commonly known as: TOPROL-XL Take 1 tablet (50 mg total) by mouth 2 (two) times daily. -Take 1 tablet (50 mg) along with an additional 25 mg tablet for total of 75 mg twice daily   modafinil 200 MG tablet Commonly known as: PROVIGIL Take 1 tablet (200 mg total) by mouth daily.   nitroGLYCERIN 0.4 MG SL tablet Commonly known as: NITROSTAT DISSOLVE 1 TABLET UNDER TONGUE EVERY 5 MINUTES UP TO 15 MIN FOR CHESTPAIN. IF NO RELIEF CALL 911.   ONE TOUCH ULTRA TEST test strip Generic drug: glucose blood TEST BLOOD SUGAR UP TO 4 TIMES DAILY.   OneTouch Delica Lancets 45X Misc USE AS DIRECTED TO TEST BLOOD SUGAR 4 TIMES DAILY.   pravastatin 40 MG tablet Commonly known as: PRAVACHOL Take 1 tablet (40 mg total) by mouth  every evening.   rOPINIRole 4 MG tablet Commonly known as: REQUIP Take 0.5 tablets (2 mg total) by mouth 2 (two) times daily.   rOPINIRole 4 MG 24 hr tablet Commonly known as: REQUIP XL Take 1 tablet (4 mg total) by mouth at bedtime.   Trelegy Ellipta 100-62.5-25 MCG/INH Aepb Generic drug: Fluticasone-Umeclidin-Vilant Inhale 1 puff into the lungs daily. Started by: Chesley Mires, MD   Trelegy Ellipta 100-62.5-25 MCG/INH Aepb Generic drug: Fluticasone-Umeclidin-Vilant Inhale 1 puff into the lungs daily. Started by: Chesley Mires, MD        Signature:  Chesley Mires, MD [place] Pager - [phone] 07/13/2020, 11:26 AM

## 2020-07-17 ENCOUNTER — Other Ambulatory Visit: Payer: Self-pay | Admitting: Nurse Practitioner

## 2020-07-17 DIAGNOSIS — E1159 Type 2 diabetes mellitus with other circulatory complications: Secondary | ICD-10-CM

## 2020-07-21 ENCOUNTER — Other Ambulatory Visit: Payer: Self-pay | Admitting: "Endocrinology

## 2020-08-10 DIAGNOSIS — G4733 Obstructive sleep apnea (adult) (pediatric): Secondary | ICD-10-CM | POA: Diagnosis not present

## 2020-08-18 ENCOUNTER — Other Ambulatory Visit: Payer: Self-pay | Admitting: "Endocrinology

## 2020-08-20 DIAGNOSIS — E1110 Type 2 diabetes mellitus with ketoacidosis without coma: Secondary | ICD-10-CM | POA: Diagnosis not present

## 2020-08-26 ENCOUNTER — Ambulatory Visit: Payer: Medicare HMO | Admitting: Family Medicine

## 2020-09-04 ENCOUNTER — Telehealth: Payer: Self-pay

## 2020-09-04 NOTE — Telephone Encounter (Signed)
Left detailed voicemail for patient to call the [place] office during business hours to get scheduled for his AWV. I left the office contact information on patient's vm.

## 2020-09-09 DIAGNOSIS — G4733 Obstructive sleep apnea (adult) (pediatric): Secondary | ICD-10-CM | POA: Diagnosis not present

## 2020-09-13 DIAGNOSIS — N401 Enlarged prostate with lower urinary tract symptoms: Secondary | ICD-10-CM | POA: Diagnosis not present

## 2020-09-13 DIAGNOSIS — R3915 Urgency of urination: Secondary | ICD-10-CM | POA: Diagnosis not present

## 2020-09-13 DIAGNOSIS — R3914 Feeling of incomplete bladder emptying: Secondary | ICD-10-CM | POA: Diagnosis not present

## 2020-09-20 ENCOUNTER — Other Ambulatory Visit: Payer: Self-pay

## 2020-09-20 ENCOUNTER — Ambulatory Visit (INDEPENDENT_AMBULATORY_CARE_PROVIDER_SITE_OTHER): Payer: Medicare HMO | Admitting: Pulmonary Disease

## 2020-09-20 ENCOUNTER — Encounter: Payer: Self-pay | Admitting: Pulmonary Disease

## 2020-09-20 ENCOUNTER — Ambulatory Visit: Payer: Medicare HMO | Admitting: Pulmonary Disease

## 2020-09-20 VITALS — BP 134/70 | HR 76 | Temp 97.6°F | Ht 65.0 in | Wt 231.0 lb

## 2020-09-20 DIAGNOSIS — U099 Post covid-19 condition, unspecified: Secondary | ICD-10-CM

## 2020-09-20 DIAGNOSIS — J4550 Severe persistent asthma, uncomplicated: Secondary | ICD-10-CM

## 2020-09-20 LAB — PULMONARY FUNCTION TEST
DL/VA % pred: 111 %
DL/VA: 4.64 ml/min/mmHg/L
DLCO cor % pred: 94 %
DLCO cor: 21.12 ml/min/mmHg
DLCO unc % pred: 94 %
DLCO unc: 21.12 ml/min/mmHg
FEF 25-75 Post: 3.13 L/sec
FEF 25-75 Pre: 2.56 L/sec
FEF2575-%Change-Post: 22 %
FEF2575-%Pred-Post: 150 %
FEF2575-%Pred-Pre: 123 %
FEV1-%Change-Post: 8 %
FEV1-%Pred-Post: 87 %
FEV1-%Pred-Pre: 80 %
FEV1-Post: 2.34 L
FEV1-Pre: 2.16 L
FEV1FVC-%Change-Post: 1 %
FEV1FVC-%Pred-Pre: 114 %
FEV6-%Change-Post: 6 %
FEV6-%Pred-Post: 79 %
FEV6-%Pred-Pre: 74 %
FEV6-Post: 2.72 L
FEV6-Pre: 2.55 L
FEV6FVC-%Pred-Post: 106 %
FEV6FVC-%Pred-Pre: 106 %
FVC-%Change-Post: 6 %
FVC-%Pred-Post: 74 %
FVC-%Pred-Pre: 69 %
FVC-Post: 2.72 L
FVC-Pre: 2.55 L
Post FEV1/FVC ratio: 86 %
Post FEV6/FVC ratio: 100 %
Pre FEV1/FVC ratio: 84 %
Pre FEV6/FVC Ratio: 100 %
RV % pred: 72 %
RV: 1.56 L
TLC % pred: 72 %
TLC: 4.37 L

## 2020-09-20 NOTE — Progress Notes (Signed)
[place] [facility]  Chief Complaint  Patient presents with   Follow-up    Asthma     Constitutional:  BP 134/70 (BP Location: Left Arm, Cuff Size: Normal)   Pulse 76   Temp 97.6 F (36.4 C) (Oral)   Ht 5\' 5"  (1.651 m)   Wt 231 lb (104.8 kg)   SpO2 97%   BMI 38.44 kg/m   Past Medical History:  Rt subclavian vein stenosis, Fatty liver, Spinal stenosis, OSA, Non obstructive CAD, Colon polyps, DVT Rt arm, HA, HTN, Hypothyroidism, HLD, PNA, PE, Psoriasis, RLS, Septic arthritis Lt knee, DM, COVID 19 infection in 2022, Atrial fibrillation  Past Surgical History:  He  has a past surgical history that includes Total knee arthroplasty (2003); Lumbar disc surgery; Wrist surgery; Total knee revision (2005); Colonoscopy; Back surgery; Knee surgery; Total knee arthroplasty (Right, 03/23/2014); Tonsillectomy; Hip surgery; Eye surgery (Left, 2016); Cataract extraction (Bilateral); Knee arthrotomy (Right, 12/04/2014); Cyst removal trunk; and LEFT HEART CATH AND CORONARY ANGIOGRAPHY (N/A, 01/17/2018).  Brief Summary:  Paul Baker is a 70 y.o. male with asthma.      Subjective:   He had PFT today.  Showed mild restriction.  Trelegy has helped his breathing.  Has intermittent cough with phlegm in the morning.  He feels better now compared to last visit.  Still has some sinus congestion from allergies.  He is going to visit family in [place] and asked if there is anything to prepare for travel to [place].  Physical Exam:   Appearance - well kempt   ENMT - no sinus tenderness, no oral exudate, no LAN, Mallampati 3 airway, no stridor  Respiratory - equal breath sounds bilaterally, no wheezing or rales  CV - s1s2 regular rate and rhythm, no murmurs  Ext - no clubbing, no edema  Skin - no rashes  Psych - normal mood and affect    Pulmonary testing:  Spirometry 10/17/11 >> 2.58 (89%), FEV1% 85 PFT 09/11/18 >> FEV1 2.19 (79%), FEV1% 82, TLC  4.48 (74%), DLCO 70%, +BD from change in FEF 25-75% PFT 09/20/20 >> FEV1 2.34 (87%), FEV1% 86, TLC 4.37 (72%), DLCO 94%  Chest Imaging:  HRCT chest 12/19/18 >> atherosclerosis, patchy air trapping, fatty liver CT angio chest 05/23/20 >> lungs clear  Sleep Tests:  PSG 2012 >> AHI 60  Cardiac Tests:  LHC 01/17/18 >> non obstructive CAD Echo 08/05/19 >> EF 55 to 60%, mild MR  Social History:  He  reports that he quit smoking about 25 years ago. His smoking use included cigarettes. He started smoking about 53 years ago. He has a 15.00 pack-year smoking history. He has never used smokeless tobacco. He reports that he does not drink alcohol and does not use drugs.  Family History:  His family history includes Diabetes in his sister; Heart attack in his father; Hypertension in his father; Kidney Stones in his father; Narcolepsy in his grandchild; Seizures in his grandchild.     Assessment/Plan:   Dyspnea on exertion. - could be related to long COVID - encouraged him to maintain a regular exercise regimen  Severe, persistent asthma. - progressed since having COVID - improved with trelegy - prn albuterol   Restriction and diffusion defect on recent PFT. - related to obesity - discussed importance of weight loss  Travel to [place]. - explained that he does not likely need any additional interventions to travel to [facility]   Coronary artery disease, Paroxysmal atrial fibrillation. - followed by  Dr. Domenic Polite with cardiology   Obstructive sleep apnea, RLS. - followed at [facility]  CKD. - followed by Dr. Moshe Cipro with [facility]  Time Spent Involved in Patient Care on Day of Examination:  33 minutes  Follow up:   Patient Instructions  Follow up in 6 months in [place] office  Medication List:   Allergies as of 09/20/2020       Reactions   Tape Rash   Pulls off skin   Cardizem [diltiazem Hcl]    Edema    Cardura [doxazosin Mesylate]     Headaches / cramps   Lipitor [atorvastatin] Other (See Comments)   Leg cramps   Paxil [paroxetine Hcl]    Unknown reaction    Codeine Rash, Other (See Comments)   Headache    Gabapentin Rash        Medication List        Accurate as of September 20, 2020 11:53 AM. If you have any questions, ask your nurse or doctor.          acetaminophen 325 MG tablet Commonly known as: TYLENOL Take 2 tablets (650 mg total) by mouth every 6 (six) hours as needed for mild pain (or Fever >/= 101).   albuterol 108 (90 Base) MCG/ACT inhaler Commonly known as: VENTOLIN HFA Inhale 2 puffs into the lungs every 6 (six) hours as needed for wheezing or shortness of breath.   amLODipine 5 MG tablet Commonly known as: NORVASC Take 1 tablet (5 mg total) by mouth daily.   apixaban 5 MG Tabs tablet Commonly known as: ELIQUIS Take 1 tablet (5 mg total) by mouth 2 (two) times daily. For stroke prevention   aspirin EC 81 MG tablet Take 1 tablet (81 mg total) by mouth daily with breakfast. Swallow whole.   B-D ULTRAFINE III SHORT PEN 31G X 8 MM Misc Generic drug: Insulin Pen Needle USING THREE TIMES DAILY   benzonatate 100 MG capsule Commonly known as: TESSALON Take 1 capsule (100 mg total) by mouth 2 (two) times daily as needed for cough.   Dexcom G6 Receiver Devi 1 Piece by Does not apply route as needed.   Dexcom G6 Sensor Misc 4 Pieces by Does not apply route once a week.   Euthyrox 137 MCG tablet Generic drug: levothyroxine TAKE 1 TABLET BY MOUTH ONCE DAILY BEFORE BREAKFAST   furosemide 40 MG tablet Commonly known as: LASIX Take by mouth.   HumuLIN R U-500 KwikPen 500 UNIT/ML KwikPen Generic drug: insulin regular human CONCENTRATED Inject 75-85 Units into the skin 3 (three) times daily with meals. Inject 85 units with breakfast and lunch, and 65 units with supper if glucose is above 90 and you are eating   isosorbide mononitrate 60 MG 24 hr tablet Commonly known as: IMDUR TAKE  1 TABLET BY MOUTH IN THE MORNING AND 1/2 (ONE-HALF) IN THE EVENING What changed:  how much to take how to take this when to take this additional instructions   loratadine 10 MG tablet Commonly known as: CLARITIN Take 10 mg by mouth daily as needed for allergies.   losartan 50 MG tablet Commonly known as: COZAAR Take 1 tablet (50 mg total) by mouth daily.   metFORMIN 500 MG tablet Commonly known as: GLUCOPHAGE TAKE 1 TABLET BY MOUTH TWICE DAILY WITH MEALS   metoprolol succinate 25 MG 24 hr tablet Commonly known as: Toprol XL Take 1 tablet (25 mg total) by mouth See admin instructions. -Take 1 tablet (25 mg) along with  an additional 50 mg tablet for total of 75 mg twice daily   metoprolol succinate 50 MG 24 hr tablet Commonly known as: TOPROL-XL Take 1 tablet (50 mg total) by mouth 2 (two) times daily. -Take 1 tablet (50 mg) along with an additional 25 mg tablet for total of 75 mg twice daily   modafinil 200 MG tablet Commonly known as: PROVIGIL Take 1 tablet (200 mg total) by mouth daily.   nitroGLYCERIN 0.4 MG SL tablet Commonly known as: NITROSTAT DISSOLVE 1 TABLET UNDER TONGUE EVERY 5 MINUTES UP TO 15 MIN FOR CHESTPAIN. IF NO RELIEF CALL 911.   ONE TOUCH ULTRA TEST test strip Generic drug: glucose blood TEST BLOOD SUGAR UP TO 4 TIMES DAILY.   OneTouch Delica Lancets 02O Misc USE AS DIRECTED TO TEST BLOOD SUGAR 4 TIMES DAILY.   pravastatin 40 MG tablet Commonly known as: PRAVACHOL Take 1 tablet (40 mg total) by mouth every evening.   rOPINIRole 4 MG tablet Commonly known as: REQUIP Take 0.5 tablets (2 mg total) by mouth 2 (two) times daily.   rOPINIRole 4 MG 24 hr tablet Commonly known as: REQUIP XL Take 1 tablet (4 mg total) by mouth at bedtime.   Trelegy Ellipta 100-62.5-25 MCG/INH Aepb Generic drug: Fluticasone-Umeclidin-Vilant Inhale 1 puff into the lungs daily.   Trelegy Ellipta 100-62.5-25 MCG/INH Aepb Generic drug:  Fluticasone-Umeclidin-Vilant Inhale 1 puff into the lungs daily.        Signature:  Chesley Mires, MD [place] Pager - [phone] 09/20/2020, 11:53 AM

## 2020-09-20 NOTE — Patient Instructions (Signed)
Follow up in 6 months in [place] office

## 2020-09-20 NOTE — Progress Notes (Signed)
Full PFT performed today. °

## 2020-09-20 NOTE — Patient Instructions (Signed)
Full PFT performed today. °

## 2020-09-23 ENCOUNTER — Ambulatory Visit: Payer: Medicare HMO | Admitting: Nurse Practitioner

## 2020-10-09 DIAGNOSIS — G4733 Obstructive sleep apnea (adult) (pediatric): Secondary | ICD-10-CM | POA: Diagnosis not present

## 2020-10-13 ENCOUNTER — Ambulatory Visit: Payer: Medicare HMO | Admitting: Cardiology

## 2020-10-14 ENCOUNTER — Telehealth: Payer: Self-pay

## 2020-10-14 NOTE — Telephone Encounter (Signed)
[facility] called for Office Notes and Labs sent over to their PA dept for his CGM. Faxed those to [phone]

## 2020-10-15 DIAGNOSIS — E1110 Type 2 diabetes mellitus with ketoacidosis without coma: Secondary | ICD-10-CM | POA: Diagnosis not present

## 2020-10-19 ENCOUNTER — Ambulatory Visit: Payer: Medicare HMO | Admitting: Nurse Practitioner

## 2020-10-19 DIAGNOSIS — I10 Essential (primary) hypertension: Secondary | ICD-10-CM

## 2020-10-19 DIAGNOSIS — E1159 Type 2 diabetes mellitus with other circulatory complications: Secondary | ICD-10-CM

## 2020-10-19 DIAGNOSIS — E038 Other specified hypothyroidism: Secondary | ICD-10-CM

## 2020-10-19 DIAGNOSIS — E782 Mixed hyperlipidemia: Secondary | ICD-10-CM

## 2020-10-24 ENCOUNTER — Other Ambulatory Visit: Payer: Self-pay | Admitting: Nurse Practitioner

## 2020-10-25 ENCOUNTER — Other Ambulatory Visit: Payer: Self-pay | Admitting: Cardiology

## 2020-10-25 ENCOUNTER — Telehealth: Payer: Self-pay

## 2020-10-25 MED ORDER — METOPROLOL SUCCINATE ER 25 MG PO TB24: 25.0000 mg | ORAL_TABLET | ORAL | 2 refills | Status: DC

## 2020-10-25 NOTE — Telephone Encounter (Signed)
Medication refill request for Metoprolol ER 25 mg tablets approved and sent to the pharmacy.

## 2020-11-01 ENCOUNTER — Other Ambulatory Visit: Payer: Self-pay | Admitting: Nurse Practitioner

## 2020-11-01 DIAGNOSIS — E1159 Type 2 diabetes mellitus with other circulatory complications: Secondary | ICD-10-CM

## 2020-11-12 DIAGNOSIS — Z6836 Body mass index (BMI) 36.0-36.9, adult: Secondary | ICD-10-CM | POA: Diagnosis not present

## 2020-11-12 DIAGNOSIS — I129 Hypertensive chronic kidney disease with stage 1 through stage 4 chronic kidney disease, or unspecified chronic kidney disease: Secondary | ICD-10-CM | POA: Diagnosis not present

## 2020-11-12 DIAGNOSIS — R42 Dizziness and giddiness: Secondary | ICD-10-CM | POA: Diagnosis not present

## 2020-11-12 DIAGNOSIS — N182 Chronic kidney disease, stage 2 (mild): Secondary | ICD-10-CM | POA: Diagnosis not present

## 2020-11-12 DIAGNOSIS — Z23 Encounter for immunization: Secondary | ICD-10-CM | POA: Diagnosis not present

## 2020-11-12 DIAGNOSIS — I4891 Unspecified atrial fibrillation: Secondary | ICD-10-CM | POA: Diagnosis not present

## 2020-11-12 DIAGNOSIS — N189 Chronic kidney disease, unspecified: Secondary | ICD-10-CM | POA: Diagnosis not present

## 2020-11-12 DIAGNOSIS — R609 Edema, unspecified: Secondary | ICD-10-CM | POA: Diagnosis not present

## 2020-11-12 DIAGNOSIS — E1122 Type 2 diabetes mellitus with diabetic chronic kidney disease: Secondary | ICD-10-CM | POA: Diagnosis not present

## 2020-11-12 DIAGNOSIS — E1129 Type 2 diabetes mellitus with other diabetic kidney complication: Secondary | ICD-10-CM | POA: Diagnosis not present

## 2020-11-12 LAB — HEMOGLOBIN A1C: Hemoglobin A1C: 10

## 2020-11-12 LAB — BASIC METABOLIC PANEL
BUN: 24 — AB (ref 4–21)
CO2: 24 — AB (ref 13–22)
Chloride: 99 (ref 99–108)
Creatinine: 1 (ref 0.6–1.3)
Glucose: 207
Potassium: 4.3 (ref 3.4–5.3)
Sodium: 138 (ref 137–147)

## 2020-11-12 LAB — COMPREHENSIVE METABOLIC PANEL
Albumin: 4.3 (ref 3.5–5.0)
Calcium: 9.6 (ref 8.7–10.7)

## 2020-11-12 LAB — IRON,TIBC AND FERRITIN PANEL: Ferritin: 149

## 2020-11-12 LAB — CBC AND DIFFERENTIAL: Hemoglobin: 12.9 — AB (ref 13.5–17.5)

## 2020-11-15 ENCOUNTER — Telehealth: Payer: Self-pay | Admitting: Nurse Practitioner

## 2020-11-15 DIAGNOSIS — E1110 Type 2 diabetes mellitus with ketoacidosis without coma: Secondary | ICD-10-CM | POA: Diagnosis not present

## 2020-11-15 NOTE — Telephone Encounter (Signed)
Paul Baker with [facility] left a VM requesting to please fax his dexcom refill to them at [phone]

## 2020-11-15 NOTE — Telephone Encounter (Signed)
Received signature from Whitney this morning and faxing paperwork this morning.

## 2020-11-22 ENCOUNTER — Other Ambulatory Visit: Payer: Self-pay | Admitting: "Endocrinology

## 2020-11-22 ENCOUNTER — Other Ambulatory Visit: Payer: Self-pay | Admitting: Nurse Practitioner

## 2020-11-30 ENCOUNTER — Encounter: Payer: Self-pay | Admitting: Nurse Practitioner

## 2020-11-30 ENCOUNTER — Other Ambulatory Visit: Payer: Self-pay

## 2020-11-30 ENCOUNTER — Ambulatory Visit: Payer: Medicare HMO | Admitting: Nurse Practitioner

## 2020-11-30 ENCOUNTER — Telehealth: Payer: Self-pay

## 2020-11-30 VITALS — BP 132/80 | HR 79 | Ht 65.0 in | Wt 231.0 lb

## 2020-11-30 DIAGNOSIS — I10 Essential (primary) hypertension: Secondary | ICD-10-CM

## 2020-11-30 DIAGNOSIS — E038 Other specified hypothyroidism: Secondary | ICD-10-CM

## 2020-11-30 DIAGNOSIS — E782 Mixed hyperlipidemia: Secondary | ICD-10-CM

## 2020-11-30 DIAGNOSIS — E1159 Type 2 diabetes mellitus with other circulatory complications: Secondary | ICD-10-CM | POA: Diagnosis not present

## 2020-11-30 MED ORDER — HUMULIN R U-500 KWIKPEN 500 UNIT/ML ~~LOC~~ SOPN: 75.0000 [IU] | PEN_INJECTOR | Freq: Three times a day (TID) | SUBCUTANEOUS | 2 refills | Status: DC

## 2020-11-30 MED ORDER — OZEMPIC (0.25 OR 0.5 MG/DOSE) 2 MG/1.5ML ~~LOC~~ SOPN: 0.5000 mg | PEN_INJECTOR | SUBCUTANEOUS | 3 refills | Status: DC

## 2020-11-30 NOTE — Progress Notes (Signed)
11/30/2020   Endocrinology follow-up note   Subjective:    Patient ID: Paul Baker, male    DOB: 05/02/1950, PCP Erven Colla, DO   Past Medical History:  Diagnosis Date   Arthritis    Asthma    Atrial fibrillation [facility])    Diagnosed December 2021   Colon polyp    Coronary atherosclerosis of native coronary artery    a. 2011: cath showing 90% stenosis along small non-dominant RCA (too small for PCI). b. 01/2018: cath showing nonobstructive CAD with 60 to 70% proximal to mid nondominant RCA stenosis, 50% mid LAD and 40 to 50% OM1   DVT (deep venous thrombosis) ([place]) 2005   Right arm   Essential hypertension    Headache    History of transfusion    Hypothyroidism    Mixed hyperlipidemia    Morbid obesity ([place])    MRSA (methicillin resistant staph aureus) culture positive    08/2012   Narcolepsy    OSA (obstructive sleep apnea)    CPAP   Osteoarthritis    Pneumonia    Psoriasis    Pulmonary embolism ([place]) 2004   RLS (restless legs syndrome)    Rotator cuff disorder    Left   Septic arthritis of knee, left (HCC)    Skin cancer, basal cell    Type 2 diabetes mellitus ([place])    Past Surgical History:  Procedure Laterality Date   BACK SURGERY     CATARACT EXTRACTION Bilateral    COLONOSCOPY     CYST REMOVAL TRUNK     from back   EYE SURGERY Left 2016   laser to left eye   HIP SURGERY     bone removed from both sides of hip   KNEE ARTHROTOMY Right 12/04/2014   Procedure: KNEE ARTHROTOMY PATELLA LIGAMENT RECONSTRUSION AND REPAIR RIGHT KNEE;  Surgeon: Paralee Cancel, MD;  Location: [place];  Service: Orthopedics;  Laterality: Right;   KNEE SURGERY     X 25 TIMES   LEFT HEART CATH AND CORONARY ANGIOGRAPHY N/A 01/17/2018   Procedure: LEFT HEART CATH AND CORONARY ANGIOGRAPHY;  Surgeon: Belva Crome, MD;  Location: [place] [facility];  Service: Cardiovascular;  Laterality: N/A;   LUMBAR DISC SURGERY     Left L3, L4, L5 discecotomy with decompression of L4 root    TONSILLECTOMY     TOTAL KNEE ARTHROPLASTY  2003   LEFT   TOTAL KNEE ARTHROPLASTY Right 03/23/2014   Procedure: RIGHT TOTAL KNEE ARTHROPLASTY AND REMOVAL RIGHT TIBIAL  DEEP IMPLANT STAPLE;  Surgeon: Paralee Cancel, MD;  Location: [facility];  Service: Orthopedics;  Laterality: Right;   TOTAL KNEE REVISION  2005   LEFT   WRIST SURGERY     Social History   Socioeconomic History   Marital status: Married    Spouse name: Paul Baker   Number of children: 2   Years of education: college   Highest education level: Not on file  Occupational History   Occupation: Disabled    Employer: UNEMPLOYED  Tobacco Use   Smoking status: Former    Packs/day: 1.50    Years: 10.00    Pack years: 15.00    Types: Cigarettes    Start date: 06/19/1967    Quit date: 01/03/1995    Years since quitting: 25.9   Smokeless tobacco: Never  Vaping Use   Vaping Use: Never used  Substance and Sexual Activity   Alcohol use: No    Alcohol/week: 0.0 standard drinks  Comment: quit drinking in 07/86   Drug use: No   Sexual activity: Yes    Partners: Female  Other Topics Concern   Not on file  Social History Narrative    70 year old, right-handed, caucasian male with a past medical history of obesity, hypertension, hyperlipidemia, diabetes, obstructive sleep apnea, presenting with frequent nighttime awakenings, excessive daytime sleepiness, also transient confusional episodes.RLS and one beosity, OSA on CPAP with AHI of 3.2 and  setting of 16 cm water , [facility] .   Social Determinants of Health   Financial Resource Strain: Not on file  Food Insecurity: Not on file  Transportation Needs: Not on file  Physical Activity: Not on file  Stress: Not on file  Social Connections: Not on file   Outpatient Encounter Medications as of 11/30/2020  Medication Sig   Semaglutide,0.25 or 0.5MG /DOS, (OZEMPIC, 0.25 OR 0.5 MG/DOSE,) 2 MG/1.5ML SOPN Inject 0.5 mg into the skin once a week.   acetaminophen (TYLENOL) 325 MG tablet  Take 2 tablets (650 mg total) by mouth every 6 (six) hours as needed for mild pain (or Fever >/= 101).   albuterol (VENTOLIN HFA) 108 (90 Base) MCG/ACT inhaler Inhale 2 puffs into the lungs every 6 (six) hours as needed for wheezing or shortness of breath.   apixaban (ELIQUIS) 5 MG TABS tablet Take 1 tablet (5 mg total) by mouth 2 (two) times daily. For stroke prevention   aspirin EC 81 MG tablet Take 1 tablet (81 mg total) by mouth daily with breakfast. Swallow whole.   benzonatate (TESSALON) 100 MG capsule Take 1 capsule (100 mg total) by mouth 2 (two) times daily as needed for cough.   Continuous Blood Gluc Receiver (DEXCOM G6 RECEIVER) DEVI 1 Piece by Does not apply route as needed.   Continuous Blood Gluc Sensor (DEXCOM G6 SENSOR) MISC 4 Pieces by Does not apply route once a week.   Fluticasone-Umeclidin-Vilant (TRELEGY ELLIPTA) 100-62.5-25 MCG/INH AEPB Inhale 1 puff into the lungs daily.   Fluticasone-Umeclidin-Vilant (TRELEGY ELLIPTA) 100-62.5-25 MCG/INH AEPB Inhale 1 puff into the lungs daily.   furosemide (LASIX) 40 MG tablet Take by mouth.   Insulin Pen Needle (B-D ULTRAFINE III SHORT PEN) 31G X 8 MM MISC USE 1 PEN NEEDLE THREE TIMES DAILY   insulin regular human CONCENTRATED (HUMULIN R U-500 KWIKPEN) 500 UNIT/ML KwikPen Inject 75-85 Units into the skin 3 (three) times daily with meals. Inject 85 units with breakfast and lunch, and 75 units with supper if glucose is above 90 and you are eating   levothyroxine (SYNTHROID) 137 MCG tablet TAKE 1 TABLET BY MOUTH ONCE DAILY BEFORE BREAKFAST   loratadine (CLARITIN) 10 MG tablet Take 10 mg by mouth daily as needed for allergies.    losartan (COZAAR) 50 MG tablet Take 1 tablet (50 mg total) by mouth daily.   metFORMIN (GLUCOPHAGE) 500 MG tablet TAKE 1 TABLET BY MOUTH TWICE DAILY WITH MEALS   metoprolol succinate (TOPROL XL) 25 MG 24 hr tablet Take 1 tablet (25 mg total) by mouth See admin instructions. -Take 1 tablet (25 mg) along with an  additional 50 mg tablet for total of 75 mg twice daily   metoprolol succinate (TOPROL-XL) 50 MG 24 hr tablet Take 1 tablet (50 mg total) by mouth 2 (two) times daily. -Take 1 tablet (50 mg) along with an additional 25 mg tablet for total of 75 mg twice daily   modafinil (PROVIGIL) 200 MG tablet Take 1 tablet (200 mg total) by mouth daily.   MYRBETRIQ  50 MG TB24 tablet Take 50 mg by mouth daily.   nitroGLYCERIN (NITROSTAT) 0.4 MG SL tablet DISSOLVE 1 TABLET UNDER TONGUE EVERY 5 MINUTES UP TO 15 MIN FOR CHESTPAIN. IF NO RELIEF CALL 911.   ONE TOUCH ULTRA TEST test strip TEST BLOOD SUGAR UP TO 4 TIMES DAILY.   ONETOUCH DELICA LANCETS 29B MISC USE AS DIRECTED TO TEST BLOOD SUGAR 4 TIMES DAILY.   pravastatin (PRAVACHOL) 40 MG tablet TAKE 1 TABLET BY MOUTH ONCE DAILY IN THE EVENING   rOPINIRole (REQUIP XL) 4 MG 24 hr tablet Take 1 tablet (4 mg total) by mouth at bedtime.   rOPINIRole (REQUIP) 4 MG tablet Take 0.5 tablets (2 mg total) by mouth 2 (two) times daily.   [DISCONTINUED] amLODipine (NORVASC) 5 MG tablet Take 1 tablet (5 mg total) by mouth daily.   [DISCONTINUED] insulin regular human CONCENTRATED (HUMULIN R U-500 KWIKPEN) 500 UNIT/ML kwikpen Inject 75-85 Units into the skin 3 (three) times daily with meals. Inject 85 units with breakfast and lunch, and 65 units with supper if glucose is above 90 and you are eating (Patient taking differently: Inject 85 Units into the skin 3 (three) times daily with meals. Inject 85 units with breakfast and lunch, and supper if glucose is above 90 and you are eating)   [DISCONTINUED] isosorbide mononitrate (IMDUR) 60 MG 24 hr tablet TAKE 1 TABLET BY MOUTH IN THE MORNING AND 1/2 (ONE-HALF) IN THE EVENING (Patient not taking: Reported on 11/30/2020)   No facility-administered encounter medications on file as of 11/30/2020.   ALLERGIES: Allergies  Allergen Reactions   Tape Rash    Pulls off skin   Cardizem [Diltiazem Hcl]     Edema    Cardura [Doxazosin  Mesylate]     Headaches / cramps   Lipitor [Atorvastatin] Other (See Comments)    Leg cramps   Paxil [Paroxetine Hcl]     Unknown reaction    Codeine Rash and Other (See Comments)    Headache    Gabapentin Rash   VACCINATION STATUS: Immunization History  Administered Date(s) Administered   Influenza Split 09/18/2012   Influenza,inj,Quad PF,6+ Mos 10/13/2013, 10/06/2014, 09/11/2018   Influenza-Unspecified 12/01/2015, 11/08/2016   PFIZER(Purple Top)SARS-COV-2 Vaccination 03/06/2019, 03/27/2019   Pneumococcal Polysaccharide-23 10/02/2004, 10/03/2011   Td 05/24/2006    Diabetes He presents for his follow-up diabetic visit. He has type 2 diabetes mellitus. Onset time: Was diagnosed at approximate age of 55 years. His disease course has been worsening. Hypoglycemia symptoms include sweats. Pertinent negatives for hypoglycemia include no confusion, headaches, nervousness/anxiousness, pallor, seizures or tremors. Associated symptoms include fatigue, foot paresthesias and polyuria. Pertinent negatives for diabetes include no chest pain, no polydipsia, no polyphagia, no weakness and no weight loss. There are no hypoglycemic complications. Symptoms are stable. Diabetic complications include a CVA, heart disease, nephropathy and peripheral neuropathy. (Has had 2 mini strokes since last visit.  Been hospitalized for Afib as well) Risk factors for coronary artery disease include diabetes mellitus, dyslipidemia, male sex, obesity, sedentary lifestyle, tobacco exposure and hypertension. Current diabetic treatment includes oral agent (dual therapy), intensive insulin program and oral agent (monotherapy). He is compliant with treatment most of the time. His weight is fluctuating minimally. He is following a generally healthy diet. When asked about meal planning, he reported none. He has had a previous visit with a dietitian. He never participates in exercise. His home blood glucose trend is increasing steadily.  His breakfast blood glucose range is generally >200 mg/dl. His lunch blood  glucose range is generally >200 mg/dl. His dinner blood glucose range is generally >200 mg/dl. His bedtime blood glucose range is generally >200 mg/dl. (He presents today with his CGM, no logs, showing significantly above target fasting and postprandial glycemic profile.  His previsit A1c was 10%, increasing from last visit of 8.6%.  Analysis of his CGM shows TIR 22%, TAR 77%, TBR 1%.  He did report recent overnight glucose in the 30s where his wife had to help him.  He says he doesn't understand why his glucose is going up when he eats fairly healthy.  He has been battling a URI. ) An ACE inhibitor/angiotensin II receptor blocker is being taken. He sees a podiatrist.Eye exam is current.  Hyperlipidemia This is a chronic problem. The current episode started more than 1 year ago. The problem is uncontrolled. Recent lipid tests were reviewed and are high. Exacerbating diseases include chronic renal disease, diabetes, hypothyroidism and obesity. There are no known factors aggravating his hyperlipidemia. Pertinent negatives include no chest pain, myalgias or shortness of breath. Current antihyperlipidemic treatment includes statins. The current treatment provides mild improvement of lipids. Compliance problems include adherence to diet and adherence to exercise.  Risk factors for coronary artery disease include dyslipidemia, diabetes mellitus, hypertension, male sex, obesity and a sedentary lifestyle.  Hypertension This is a chronic problem. The current episode started more than 1 year ago. The problem has been gradually improving since onset. The problem is uncontrolled. Associated symptoms include sweats. Pertinent negatives include no chest pain, headaches, neck pain, palpitations or shortness of breath. Agents associated with hypertension include thyroid hormones. Risk factors for coronary artery disease include diabetes mellitus,  dyslipidemia, male gender, obesity, sedentary lifestyle and smoking/tobacco exposure. Past treatments include angiotensin blockers, beta blockers and diuretics. Compliance problems include exercise and diet.  Hypertensive end-organ damage includes kidney disease, CAD/MI and CVA. Identifiable causes of hypertension include chronic renal disease and a thyroid problem.  Thyroid Problem Presents for follow-up visit. Symptoms include fatigue and leg swelling. Patient reports no anxiety, cold intolerance, constipation, depressed mood, diarrhea, heat intolerance, palpitations, tremors, weight gain or weight loss. The symptoms have been stable. Past treatments include levothyroxine. His past medical history is significant for diabetes and hyperlipidemia.    Review of systems  Constitutional: + stable body weight,  current Body mass index is 38.44 kg/m. , no fatigue, no subjective hyperthermia, no subjective hypothermia, reports mild memory impairment since mini strokes Eyes: no blurry vision, no xerophthalmia ENT: no sore throat, no nodules palpated in throat, no dysphagia/odynophagia, no hoarseness, + nasal congestion- has a cold Cardiovascular: no chest pain, no shortness of breath, no palpitations,  Respiratory: no cough, no shortness of breath Gastrointestinal: no nausea/vomiting/diarrhea Musculoskeletal: no muscle/joint aches Skin: no rashes, no hyperemia Neurological: no tremors, no numbness, no tingling, no dizziness, + intermittent numbness/tingling to bilateral feet Psychiatric: no depression, no anxiety    Objective:    BP 132/80   Pulse 79   Ht 5\' 5"  (1.651 m)   Wt 231 lb (104.8 kg)   BMI 38.44 kg/m   Wt Readings from Last 3 Encounters:  11/30/20 231 lb (104.8 kg)  09/20/20 231 lb (104.8 kg)  07/13/20 235 lb 9.6 oz (106.9 kg)     BP Readings from Last 3 Encounters:  11/30/20 132/80  09/20/20 134/70  07/13/20 128/60     Physical Exam- Limited  Constitutional:  Body mass  index is 38.44 kg/m. , not in acute distress, normal state of mind Eyes:  EOMI, no exophthalmos Neck: Supple Cardiovascular: Irregular (hx afib), no murmurs, rubs, or gallops, 2+ pitting edema to BLE Respiratory: Adequate breathing efforts, no crackles, rales, rhonchi, or wheezing Musculoskeletal: no gross deformities, strength intact in all four extremities, no gross restriction of joint movements Skin:  no rashes, no hyperemia Neurological: no tremor with outstretched hands     CMP Latest Ref Rng & Units 11/12/2020 05/23/2020 05/21/2020  Glucose 70 - 99 mg/dL - 196(H) 298(H)  BUN 4 - 21 24(A) 37(H) 29(H)  Creatinine 0.6 - 1.3 1.0 1.43(H) 1.08  Sodium 137 - 147 138 137 137  Potassium 3.4 - 5.3 4.3 3.8 4.5  Chloride 99 - 108 99 103 103  CO2 13 - 22 24(A) 24 24  Calcium 8.7 - 10.7 9.6 9.3 9.4  Total Protein 6.5 - 8.1 g/dL - 6.9 -  Total Bilirubin 0.3 - 1.2 mg/dL - 0.4 -  Alkaline Phos 38 - 126 U/L - 50 -  AST 15 - 41 U/L - 23 -  ALT 0 - 44 U/L - 23 -     Diabetic Labs (most recent): Lab Results  Component Value Date   HGBA1C 10.0 11/12/2020   HGBA1C 8.6 (A) 06/22/2020   HGBA1C 9.7 (A) 03/18/2020   Lipid Panel     Component Value Date/Time   CHOL 177 08/05/2019 0334   CHOL 148 08/19/2015 0914   TRIG 95 08/05/2019 0334   HDL 56 08/05/2019 0334   HDL 53 08/19/2015 0914   CHOLHDL 3.2 08/05/2019 0334   VLDL 19 08/05/2019 0334   LDLCALC 102 (H) 08/05/2019 0334   LDLCALC 118 (H) 02/06/2019 0813     Assessment & Plan:   1) Uncontrolled type 2 diabetes mellitus with circulatory complication, with long-term current use of insulin (HCC)  -His diabetes is complicated by coronary artery disease and patient remains at an extremely high risk for more acute and chronic complications of diabetes which include CAD, CVA, CKD, retinopathy, and neuropathy. These are all discussed in detail with the patient.  He presents today with his CGM, no logs, showing significantly above target  fasting and postprandial glycemic profile.  His previsit A1c was 10%, increasing from last visit of 8.6%.  Analysis of his CGM shows TIR 22%, TAR 77%, TBR 1%.  He did report recent overnight glucose in the 30s where his wife had to help him.  He says he doesn't understand why his glucose is going up when he eats fairly healthy.  He has been battling a URI.     Glucose logs and insulin administration records pertaining to this visit,  to be scanned into patient's records.  Recent labs reviewed.  - Nutritional counseling repeated at each appointment due to patients tendency to fall back in to old habits.  - The patient admits there is a room for improvement in their diet and drink choices. -  Suggestion is made for the patient to avoid simple carbohydrates from their diet including Cakes, Sweet Desserts / Pastries, Ice Cream, Soda (diet and regular), Sweet Tea, Candies, Chips, Cookies, Sweet Pastries, Store Bought Juices, Alcohol in Excess of 1-2 drinks a day, Artificial Sweeteners, Coffee Creamer, and "Sugar-free" Products. This will help patient to have stable blood glucose profile and potentially avoid unintended weight gain.   - I encouraged the patient to switch to unprocessed or minimally processed complex starch and increased protein intake (animal or plant source), fruits, and vegetables.   - Patient is advised to stick to a routine mealtimes  to eat 3 meals a day and avoid unnecessary snacks (to snack only to correct hypoglycemia).  - I have approached patient with the following individualized plan to manage diabetes and patient agrees.  -He will continue to need intensive treatment with higher dose of insulin.  He has been better with U500 versus [place] and regular insulin.     -Given his persistent hyperglycemia, he is approached to start GLP1 and he agreed.  I discussed and initiated Ozempic 0.25 mg SQ weekly x 2 weeks, then may advance to 0.5 mg SQ weekly thereafter if tolerated well  (sample provided from office today).  He is to continue his Humulin R- U-500 at 85 units with breakfast and lunch and lower his supper dose to 75 units to avoid nocturnal hypoglycemia.  He can continue his Metformin 500 mg po twice daily with meals (renal function has improved).   -He is encouraged to continue using his CGM to monitor glucose 4 times daily, before meals and before bed, and to call the clinic if he has readings less than 70 or greater than 300 for 3 tests in a row.  2) BP/HTN:  His blood pressure is controlled to target.  He is advised to continue Lasix 40 mg po daily, Losartan 50 mg po daily, Norvasc 5 mg po daily, and Metoprolol 75 mg po twice daily.  3) Lipids/HPL:  His most recent lipid panel from 08/05/19 shows uncontrolled LDL of 102 (improving).  He is advised to continue Pravastatin 40 mg po daily at bedtime.  Side effects and precautions discussed with him.    4)  Weight/Diet:  His Body mass index is 17.00 kg/m.-complicating his diabetes care.  He is a candidate for modest weight loss.  CDE consult in progress, exercise, and carbohydrates information provided.  5) Hypothyroidism -He does not have recent TFTs to review.  He is advised to continue current dose of Levothyroxine at 137 mcg po daily before breakfast.  Will recheck TFTs prior to next visit and adjust dose if needed.    - We discussed about the correct intake of his thyroid hormone, on empty stomach at fasting, with water, separated by at least 30 minutes from breakfast and other medications,  and separated by more than 4 hours from calcium, iron, multivitamins, acid reflux medications (PPIs). -Patient is made aware of the fact that thyroid hormone replacement is needed for life, dose to be adjusted by periodic monitoring of thyroid function tests.  6) Chronic Care/Health Maintenance: -Patient is on ACEI/ARB and Statin medications and encouraged to continue to follow up with Ophthalmology, Podiatrist at least  yearly or according to recommendations, and advised to  stay away from smoking. I have recommended yearly flu vaccine and pneumonia vaccination at least every 5 years; moderate intensity exercise for up to 150 minutes weekly; and  sleep for at least 7 hours a day.  - I advised patient to maintain close follow up with Erven Colla, DO for primary care needs.     I spent 46 minutes in the care of the patient today including review of labs from [place], Lipids, Thyroid Function, Hematology (current and previous including abstractions from other facilities); face-to-face time discussing  his blood glucose readings/logs, discussing hypoglycemia and hyperglycemia episodes and symptoms, medications doses, his options of short and long term treatment based on the latest standards of care / guidelines;  discussion about incorporating lifestyle medicine;  and documenting the encounter.    Please refer to Patient Instructions for Blood  Glucose Monitoring and Insulin/Medications Dosing Guide"  in media tab for additional information. Please  also refer to " Patient Self Inventory" in the Media  tab for reviewed elements of pertinent patient history.  Paul Baker participated in the discussions, expressed understanding, and voiced agreement with the above plans.  All questions were answered to his satisfaction. he is encouraged to contact clinic should he have any questions or concerns prior to his return visit.   Follow up plan: -Return in about 3 months (around 03/01/2021) for Diabetes F/U with A1c in office, Thyroid follow up, Previsit labs, Bring meter and logs.  Rayetta Pigg, [facility] [address] [place], [place] [address] Phone: [phone] Fax: [phone]  11/30/2020, 2:46 PM

## 2020-11-30 NOTE — Patient Instructions (Signed)

## 2020-11-30 NOTE — Telephone Encounter (Signed)
No recent CPAP data not found. LVM for pt to bring cpap/power cord to appt.

## 2020-11-30 NOTE — Telephone Encounter (Signed)
error 

## 2020-12-01 ENCOUNTER — Ambulatory Visit (INDEPENDENT_AMBULATORY_CARE_PROVIDER_SITE_OTHER): Payer: Medicare HMO | Admitting: Family Medicine

## 2020-12-01 ENCOUNTER — Encounter: Payer: Self-pay | Admitting: Family Medicine

## 2020-12-01 VITALS — BP 137/75 | HR 74 | Ht 65.0 in | Wt 228.0 lb

## 2020-12-01 DIAGNOSIS — Z9989 Dependence on other enabling machines and devices: Secondary | ICD-10-CM

## 2020-12-01 DIAGNOSIS — G4733 Obstructive sleep apnea (adult) (pediatric): Secondary | ICD-10-CM

## 2020-12-01 DIAGNOSIS — G4719 Other hypersomnia: Secondary | ICD-10-CM | POA: Diagnosis not present

## 2020-12-01 DIAGNOSIS — G2581 Restless legs syndrome: Secondary | ICD-10-CM

## 2020-12-01 MED ORDER — MODAFINIL 200 MG PO TABS: 200.0000 mg | ORAL_TABLET | Freq: Every day | ORAL | 1 refills | Status: DC

## 2020-12-01 MED ORDER — ROPINIROLE HCL 4 MG PO TABS: 2.0000 mg | ORAL_TABLET | Freq: Two times a day (BID) | ORAL | 1 refills | Status: DC

## 2020-12-01 MED ORDER — ROPINIROLE HCL ER 4 MG PO TB24: 4.0000 mg | ORAL_TABLET | Freq: Every day | ORAL | 1 refills | Status: DC

## 2020-12-01 NOTE — Patient Instructions (Signed)
Please continue using your CPAP regularly. While your insurance requires that you use CPAP at least 4 hours each night on 70% of the nights, I recommend, that you not skip any nights and use it throughout the night if you can. Getting used to CPAP and staying with the treatment long term does take time and patience and discipline. Untreated obstructive sleep apnea when it is moderate to severe can have an adverse impact on cardiovascular health and raise her risk for heart disease, arrhythmias, hypertension, congestive heart failure, stroke and diabetes. Untreated obstructive sleep apnea causes sleep disruption, nonrestorative sleep, and sleep deprivation. This can have an impact on your day to day functioning and cause daytime sleepiness and impairment of cognitive function, memory loss, mood disturbance, and problems focussing. Using CPAP regularly can improve these symptoms.   We will order a new CPAP.

## 2020-12-01 NOTE — Progress Notes (Signed)
CM sent to [facility]

## 2020-12-01 NOTE — Progress Notes (Signed)
PATIENT: Paul Baker DOB: 1950/07/13  REASON FOR VISIT: follow up HISTORY FROM: patient  Chief Complaint  Patient presents with   Follow-up    Pt alone, rm 11. Presents for f/u visit. Pt states that the modafinil is not working as effectively as it should. States that machine is working well although he was set up with this machine 07/23/14. DME. [facility]      HISTORY OF PRESENT ILLNESS: 12/01/20 ALL: Paul Baker returns for follow up for OSA on CPAP, EDS and RLS. He continues ropinirole XL 4mg  BID and IR 2mg  BID. RLS is well managed. He continues to work on 4 hour compliance. He is concerned data is not recording correctly. He is using CPAP nightly and feels that he uses for more than 4 hours. His machine is 69 years old. Modafinil does not work as well as it used to but he does not want to stop it. Armodafinil works better but too expensive. He continues close follow up with PCP.     05/26/2020 ALL: He returns for follow up for OSA on CPAP, EDS and RLS. He continues ropinirole XL 4mg  BID and IR 2mg  BID. He feels RLS symptoms are fairly stable. He has not resumed modafinil for EDS. He reports that medication is expensive and he is unsure if it helps. He does not monitor BP at home. Readings are usually 130's/80's in the PCP office.   He reports that he is using CPAP nightly. He did get a new FFM and feels it has worked better than nasal pillow. He places mask around 10pm and takes it off around 7. He does wake to urinate a couple of times throughout the night. ResMed data documenting 3-4 hour usage, on average. Also not recording any data past 04/13/2020. 4 hour compliance has consistently been low. Paul Swider feels this is inaccurate. He is adamant that he has used CPAP consistently.   He was diagnosed with Covid about 3-4 weeks ago. He was seen by PCP 05/21/2020 for continued shob. Xray showed possible pna. He was treated with abx and steroids. ER visit 5/23 for dyspnea,  workup unremarkable. He reports feeling better, today. He admits CBGs continue to fluctuate. They seem a little better over the past few weeks but were elevated last night and low this morning. He is followed closely by endocrinology.   Data pulled with SD card reveals that he has used CPAP 30/30 days. He used CPAP greater than 4 hours 20/30 days for compliance of 67%. Average usage was 4hr 78min. Residual AHI 6.9 on set pressure of 10cmH20 and ERP 3. Leak was 21.1l/min.   11/26/2019 ALL:  Paul Baker is a 70 y.o. male here today for follow up for OSA on CPAP.  He has done much better with CPAP compliance but continues to struggle with his mask.  He is using a nasal pillow and reports that it slips off of his face every night.  He has tried tightening headgear with no improvement.  He reports that unusual sounds from his CPAP have improved with replacing his supplies.  He continues to work closely with primary care, cardiology and endocrinology.  Blood sugars continue to be uncontrolled.  He is having significant daytime fatigue.  Compliance report dated 10/26/2019 through 11/24/2019 reveals that he used CPAP 26 of the past 30 days for compliance of 87%.  He used CPAP greater than 4 hours 17 of the past 30 days for compliance of 57%.  Average  usage on days used was 4 hours and 47 minutes.  Residual AHI was elevated at 10.5 on 10 cm of water and an EPR of 3.  There was a leak noted in the 95th percentile of 42.6 L/min.  HISTORY: (copied from previous note)  Paul Baker is a 70 y.o. male here today for follow up for OSA on CPAP, RLS and EDS. He continues to do fairly well on Requip and modafinil. He admits that he has had a difficult time with using CPAP. He has not been able to get comfortable with his mask. He also states that CPAP will cut off in the middle of the night. He called [employer] but has not taken his machine in to be checked out. He knows that he needs to use CPAP and wants to  continue working on compliance.    Compliance report dated 07/26/2019 through 08/24/2019 reveals that he used CPAP 28 of the last 30 days for compliance of 67%.  He used CPAP greater than 4 hours 6 of the past 30 days for compliance of 20%.  Average usage on days used was 3 hours and 35 minutes.  Residual AHI was 6.8 with a set pressure of 10 cm of water and an EPR of 3.  There was a leak noted in the 95th percentile of 25.4.   He was seen in the ER on 08/04/2019 for dizziness and right sided numbness. CBG was elevated (300's). Insulin was increased and he was started on Plavix for concerns of TIA. He is followed by PCP, last seen 8/18.     REVIEW OF SYSTEMS: Out of a complete 14 system review of symptoms, the patient complains only of the following symptoms, day time sleepiness, fatigue, RLS and all other reviewed systems are negative.  ESS: 19 FSS: 36  ALLERGIES: Allergies  Allergen Reactions   Tape Rash    Pulls off skin   Cardizem [Diltiazem Hcl]     Edema    Cardura [Doxazosin Mesylate]     Headaches / cramps   Lipitor [Atorvastatin] Other (See Comments)    Leg cramps   Paxil [Paroxetine Hcl]     Unknown reaction    Codeine Rash and Other (See Comments)    Headache    Gabapentin Rash    HOME MEDICATIONS: Outpatient Medications Prior to Visit  Medication Sig Dispense Refill   acetaminophen (TYLENOL) 325 MG tablet Take 2 tablets (650 mg total) by mouth every 6 (six) hours as needed for mild pain (or Fever >/= 101). 30 tablet 1   albuterol (VENTOLIN HFA) 108 (90 Base) MCG/ACT inhaler Inhale 2 puffs into the lungs every 6 (six) hours as needed for wheezing or shortness of breath. 18 g 5   apixaban (ELIQUIS) 5 MG TABS tablet Take 1 tablet (5 mg total) by mouth 2 (two) times daily. For stroke prevention 60 tablet 5   aspirin EC 81 MG tablet Take 1 tablet (81 mg total) by mouth daily with breakfast. Swallow whole. 150 tablet 2   benzonatate (TESSALON) 100 MG capsule Take 1 capsule (100  mg total) by mouth 2 (two) times daily as needed for cough. 20 capsule 0   Continuous Blood Gluc Receiver (DEXCOM G6 RECEIVER) DEVI 1 Piece by Does not apply route as needed. 1 each 0   Continuous Blood Gluc Sensor (DEXCOM G6 SENSOR) MISC 4 Pieces by Does not apply route once a week. 4 each 2   Fluticasone-Umeclidin-Vilant (TRELEGY ELLIPTA) 100-62.5-25 MCG/INH AEPB Inhale 1 puff  into the lungs daily. 60 each 5   Fluticasone-Umeclidin-Vilant (TRELEGY ELLIPTA) 100-62.5-25 MCG/INH AEPB Inhale 1 puff into the lungs daily. 28 each 0   furosemide (LASIX) 40 MG tablet Take by mouth.     Insulin Pen Needle (B-D ULTRAFINE III SHORT PEN) 31G X 8 MM MISC USE 1 PEN NEEDLE THREE TIMES DAILY 100 each 0   insulin regular human CONCENTRATED (HUMULIN R U-500 KWIKPEN) 500 UNIT/ML KwikPen Inject 75-85 Units into the skin 3 (three) times daily with meals. Inject 85 units with breakfast and lunch, and 75 units with supper if glucose is above 90 and you are eating 60 mL 2   levothyroxine (SYNTHROID) 137 MCG tablet TAKE 1 TABLET BY MOUTH ONCE DAILY BEFORE BREAKFAST 90 tablet 0   loratadine (CLARITIN) 10 MG tablet Take 10 mg by mouth daily as needed for allergies.      losartan (COZAAR) 50 MG tablet Take 1 tablet (50 mg total) by mouth daily. 90 tablet 0   metFORMIN (GLUCOPHAGE) 500 MG tablet TAKE 1 TABLET BY MOUTH TWICE DAILY WITH MEALS 60 tablet 0   metoprolol succinate (TOPROL XL) 25 MG 24 hr tablet Take 1 tablet (25 mg total) by mouth See admin instructions. -Take 1 tablet (25 mg) along with an additional 50 mg tablet for total of 75 mg twice daily 180 tablet 2   metoprolol succinate (TOPROL-XL) 50 MG 24 hr tablet Take 1 tablet (50 mg total) by mouth 2 (two) times daily. -Take 1 tablet (50 mg) along with an additional 25 mg tablet for total of 75 mg twice daily 180 tablet 3   MYRBETRIQ 50 MG TB24 tablet Take 50 mg by mouth daily.     nitroGLYCERIN (NITROSTAT) 0.4 MG SL tablet DISSOLVE 1 TABLET UNDER TONGUE EVERY 5  MINUTES UP TO 15 MIN FOR CHESTPAIN. IF NO RELIEF CALL 911. 25 tablet 3   ONE TOUCH ULTRA TEST test strip TEST BLOOD SUGAR UP TO 4 TIMES DAILY. 150 each 5   ONETOUCH DELICA LANCETS 16X MISC USE AS DIRECTED TO TEST BLOOD SUGAR 4 TIMES DAILY. 150 each 5   pravastatin (PRAVACHOL) 40 MG tablet TAKE 1 TABLET BY MOUTH ONCE DAILY IN THE EVENING 90 tablet 2   Semaglutide,0.25 or 0.5MG /DOS, (OZEMPIC, 0.25 OR 0.5 MG/DOSE,) 2 MG/1.5ML SOPN Inject 0.5 mg into the skin once a week. 3 mL 3   modafinil (PROVIGIL) 200 MG tablet Take 1 tablet (200 mg total) by mouth daily. 30 tablet 5   rOPINIRole (REQUIP XL) 4 MG 24 hr tablet Take 1 tablet (4 mg total) by mouth at bedtime. 90 tablet 1   rOPINIRole (REQUIP) 4 MG tablet Take 0.5 tablets (2 mg total) by mouth 2 (two) times daily. 90 tablet 1   No facility-administered medications prior to visit.    PAST MEDICAL HISTORY: Past Medical History:  Diagnosis Date   Arthritis    Asthma    Atrial fibrillation [facility])    Diagnosed December 2021   Colon polyp    Coronary atherosclerosis of native coronary artery    a. 2011: cath showing 90% stenosis along small non-dominant RCA (too small for PCI). b. 01/2018: cath showing nonobstructive CAD with 60 to 70% proximal to mid nondominant RCA stenosis, 50% mid LAD and 40 to 50% OM1   DVT (deep venous thrombosis) ([place]) 2005   Right arm   Essential hypertension    Headache    History of transfusion    Hypothyroidism    Mixed hyperlipidemia  Morbid obesity ([place])    MRSA (methicillin resistant staph aureus) culture positive    08/2012   Narcolepsy    OSA (obstructive sleep apnea)    CPAP   Osteoarthritis    Pneumonia    Psoriasis    Pulmonary embolism ([place]) 2004   RLS (restless legs syndrome)    Rotator cuff disorder    Left   Septic arthritis of knee, left (HCC)    Skin cancer, basal cell    Type 2 diabetes mellitus ([place])     PAST SURGICAL HISTORY: Past Surgical History:  Procedure Laterality Date   BACK  SURGERY     CATARACT EXTRACTION Bilateral    COLONOSCOPY     CYST REMOVAL TRUNK     from back   EYE SURGERY Left 2016   laser to left eye   HIP SURGERY     bone removed from both sides of hip   KNEE ARTHROTOMY Right 12/04/2014   Procedure: KNEE ARTHROTOMY PATELLA LIGAMENT RECONSTRUSION AND REPAIR RIGHT KNEE;  Surgeon: Paralee Cancel, MD;  Location: [facility];  Service: Orthopedics;  Laterality: Right;   KNEE SURGERY     X 25 TIMES   LEFT HEART CATH AND CORONARY ANGIOGRAPHY N/A 01/17/2018   Procedure: LEFT HEART CATH AND CORONARY ANGIOGRAPHY;  Surgeon: Belva Crome, MD;  Location: [place] [facility];  Service: Cardiovascular;  Laterality: N/A;   LUMBAR DISC SURGERY     Left L3, L4, L5 discecotomy with decompression of L4 root   TONSILLECTOMY     TOTAL KNEE ARTHROPLASTY  2003   LEFT   TOTAL KNEE ARTHROPLASTY Right 03/23/2014   Procedure: RIGHT TOTAL KNEE ARTHROPLASTY AND REMOVAL RIGHT TIBIAL  DEEP IMPLANT STAPLE;  Surgeon: Paralee Cancel, MD;  Location: [facility];  Service: Orthopedics;  Laterality: Right;   TOTAL KNEE REVISION  2005   LEFT   WRIST SURGERY      FAMILY HISTORY: Family History  Problem Relation Age of Onset   Hypertension Father    Heart attack Father    Kidney Stones Father    Seizures Grandchild    Narcolepsy Grandchild    Diabetes Sister     SOCIAL HISTORY: Social History   Socioeconomic History   Marital status: Married    Spouse name: Information systems manager   Number of children: 2   Years of education: college   Highest education level: Not on file  Occupational History   Occupation: Disabled    Employer: UNEMPLOYED  Tobacco Use   Smoking status: Former    Packs/day: 1.50    Years: 10.00    Pack years: 15.00    Types: Cigarettes    Start date: 06/19/1967    Quit date: 01/03/1995    Years since quitting: 25.9   Smokeless tobacco: Never  Vaping Use   Vaping Use: Never used  Substance and Sexual Activity   Alcohol use: No    Alcohol/week: 0.0 standard drinks     Comment: quit drinking in 07/86   Drug use: No   Sexual activity: Yes    Partners: Female  Other Topics Concern   Not on file  Social History Narrative    70 year old, right-handed, caucasian male with a past medical history of obesity, hypertension, hyperlipidemia, diabetes, obstructive sleep apnea, presenting with frequent nighttime awakenings, excessive daytime sleepiness, also transient confusional episodes.RLS and one beosity, OSA on CPAP with AHI of 3.2 and  setting of 16 cm water , [facility] .   Social Determinants of Health  Financial Resource Strain: Not on file  Food Insecurity: Not on file  Transportation Needs: Not on file  Physical Activity: Not on file  Stress: Not on file  Social Connections: Not on file  Intimate Partner Violence: Not on file     PHYSICAL EXAM  Vitals:   12/01/20 1304  BP: 137/75  Pulse: 74  Weight: 228 lb (103.4 kg)  Height: 5\' 5"  (1.651 m)    Body mass index is 37.94 kg/m.  Generalized: Well developed, in no acute distress  Cardiology: normal rate and rhythm, no murmur noted Respiratory: clear to auscultation bilaterally  Neurological examination  Mentation: Alert oriented to time, place, history taking. Follows all commands speech and language fluent Cranial nerve II-XII: Pupils were equal round reactive to light. Extraocular movements were full, visual field were full  Motor: The motor testing reveals 5 over 5 strength of all 4 extremities. Good symmetric motor tone is noted throughout.  Gait and station: Gait is stable without assistive devices.   DIAGNOSTIC DATA (LABS, IMAGING, TESTING) - I reviewed patient records, labs, notes, testing and imaging myself where available.  No flowsheet data found.   Lab Results  Component Value Date   WBC 10.0 05/23/2020   HGB 12.9 (A) 11/12/2020   HCT 36.4 (L) 05/23/2020   MCV 92.2 05/23/2020   PLT 274 05/23/2020      Component Value Date/Time   NA 138 11/12/2020 0000   K 4.3  11/12/2020 0000   CL 99 11/12/2020 0000   CO2 24 (A) 11/12/2020 0000   GLUCOSE 196 (H) 05/23/2020 2150   BUN 24 (A) 11/12/2020 0000   CREATININE 1.0 11/12/2020 0000   CREATININE 1.43 (H) 05/23/2020 2150   CREATININE 1.11 02/06/2019 0813   CALCIUM 9.6 11/12/2020 0000   PROT 6.9 05/23/2020 2150   PROT 6.6 03/10/2020 0805   ALBUMIN 4.3 11/12/2020 0000   ALBUMIN 4.3 03/10/2020 0805   AST 23 05/23/2020 2150   ALT 23 05/23/2020 2150   ALKPHOS 50 05/23/2020 2150   BILITOT 0.4 05/23/2020 2150   BILITOT 0.3 03/10/2020 0805   GFRNONAA 53 (L) 05/23/2020 2150   GFRNONAA 68 02/06/2019 0813   GFRAA 87 12/04/2019 0832   GFRAA 79 02/06/2019 0813   Lab Results  Component Value Date   CHOL 177 08/05/2019   HDL 56 08/05/2019   LDLCALC 102 (H) 08/05/2019   TRIG 95 08/05/2019   CHOLHDL 3.2 08/05/2019   Lab Results  Component Value Date   HGBA1C 10.0 11/12/2020   No results found for: VITAMINB12 Lab Results  Component Value Date   TSH 1.310 03/10/2020     ASSESSMENT AND PLAN 70 y.o. year old male  has a past medical history of Arthritis, Asthma, Atrial fibrillation ([place]), Colon polyp, Coronary atherosclerosis of native coronary artery, DVT (deep venous thrombosis) ([place]) (2005), Essential hypertension, Headache, History of transfusion, Hypothyroidism, Mixed hyperlipidemia, Morbid obesity ([place]), MRSA (methicillin resistant staph aureus) culture positive, Narcolepsy, OSA (obstructive sleep apnea), Osteoarthritis, Pneumonia, Psoriasis, Pulmonary embolism ([place]) (2004), RLS (restless legs syndrome), Rotator cuff disorder, Septic arthritis of knee, left ([place]), Skin cancer, basal cell, and Type 2 diabetes mellitus ([place]). here with     ICD-10-CM   1. OSA on CPAP  G47.33 For home use only DME continuous positive airway pressure (CPAP)   Z99.89 For home use only DME continuous positive airway pressure (CPAP)    2. Excessive daytime sleepiness  G47.19     3. RLS (restless legs syndrome)  G25.81  rOPINIRole (  REQUIP XL) 4 MG 24 hr tablet    rOPINIRole (REQUIP) 4 MG tablet        Paul Baker is doing better on CPAP therapy. Compliance report reveals excellent daily compliance with suboptimal 4-hour compliance. 4 hour compliance is steadily improving. Leak is significantly improved. AHI remains elevated. I will increase pressure to 11cmH20. I have also placed an order for a new CPAP. His is over 4 years old. RLS is well managed on ropinirole and he will continue as prescribed. EDS remains a concern and multiple factors contributing. We will continue modafinil but expectations of effectiveness discussed. He will call if readings consistently greater than 140/80.  He will continue close follow-up with primary care and endocrinology for uncontrolled diabetes and stroke prevention. We have discussed link between fatigue/daytime sleepiness with fluctuating blood sugars. He was encouraged to continue using CPAP nightly and for greater than 4 hours each night. We will update supply orders as indicated. Risks of untreated sleep apnea review and education materials provided. Healthy lifestyle habits encouraged. He will follow up in 6 months, sooner if needed. He verbalizes understanding and agreement with this plan.     Orders Placed This Encounter  Procedures   For home use only DME continuous positive airway pressure (CPAP)    Change set pressure to 11cmH20. NP has adjusted in Resmed.    Order Specific Question:   Length of Need    Answer:   Lifetime    Order Specific Question:   Patient has OSA or probable OSA    Answer:   Yes    Order Specific Question:   Is the patient currently using CPAP in the home    Answer:   Yes    Order Specific Question:   Settings    Answer:   Other see comments    Order Specific Question:   CPAP supplies needed    Answer:   Mask, headgear, cushions, filters, heated tubing and water chamber   For home use only DME continuous positive airway pressure (CPAP)     New CPAP machine with set pressure of 11cmH20. EPR 3cmH20.    Order Specific Question:   Length of Need    Answer:   Lifetime    Order Specific Question:   Patient has OSA or probable OSA    Answer:   Yes    Order Specific Question:   Is the patient currently using CPAP in the home    Answer:   Yes    Order Specific Question:   Settings    Answer:   Other see comments    Order Specific Question:   CPAP supplies needed    Answer:   Mask, headgear, cushions, filters, heated tubing and water chamber      Meds ordered this encounter  Medications   modafinil (PROVIGIL) 200 MG tablet    Sig: Take 1 tablet (200 mg total) by mouth daily.    Dispense:  90 tablet    Refill:  1    Order Specific Question:   Supervising Provider    Answer:   Melvenia Beam [[id number]]   rOPINIRole (REQUIP XL) 4 MG 24 hr tablet    Sig: Take 1 tablet (4 mg total) by mouth at bedtime.    Dispense:  90 tablet    Refill:  1    Order Specific Question:   Supervising Provider    Answer:   Melvenia Beam [[id number]]   rOPINIRole (REQUIP) 4 MG tablet  Sig: Take 0.5 tablets (2 mg total) by mouth 2 (two) times daily.    Dispense:  90 tablet    Refill:  1    Order Specific Question:   Supervising Provider    Answer:   Bess Harvest, FNP-C 12/01/2020, 1:47 PM [facility] [address], [place], [place] [address] [phone]

## 2020-12-02 DIAGNOSIS — G4733 Obstructive sleep apnea (adult) (pediatric): Secondary | ICD-10-CM | POA: Diagnosis not present

## 2020-12-06 ENCOUNTER — Telehealth: Payer: Self-pay | Admitting: Nurse Practitioner

## 2020-12-06 NOTE — Telephone Encounter (Signed)
Pt is calling and states ozempic is too expensive and would like to know his other options. He states he seen there is a patient assistance form he could fill out. Should he try that? Or would you like to send in an alternative. [facility] [place].

## 2020-12-07 ENCOUNTER — Telehealth: Payer: Self-pay | Admitting: *Deleted

## 2020-12-07 NOTE — Telephone Encounter (Signed)
PA approved effective 01/03/2020 - 01/01/2021.

## 2020-12-07 NOTE — Telephone Encounter (Signed)
He can try the patient assistance program.  All other similar meds are priced similarly too!

## 2020-12-07 NOTE — Telephone Encounter (Signed)
No answer when attempting to contact pt.

## 2020-12-07 NOTE — Telephone Encounter (Signed)
Submitted PA modafinil on CMM. Key: [id number]. PA Case ID: [id number] - Rx #: [id number]. Waiting on determination from [facility] [employer].

## 2020-12-08 ENCOUNTER — Other Ambulatory Visit: Payer: Self-pay

## 2020-12-08 ENCOUNTER — Ambulatory Visit
Admission: EM | Admit: 2020-12-08 | Discharge: 2020-12-08 | Disposition: A | Discharge status: Home / Self Care | Payer: Medicare HMO | Attending: Family Medicine | Admitting: Family Medicine

## 2020-12-08 ENCOUNTER — Encounter: Payer: Self-pay | Admitting: Emergency Medicine

## 2020-12-08 DIAGNOSIS — R059 Cough, unspecified: Secondary | ICD-10-CM

## 2020-12-08 DIAGNOSIS — J4541 Moderate persistent asthma with (acute) exacerbation: Secondary | ICD-10-CM | POA: Diagnosis not present

## 2020-12-08 DIAGNOSIS — J22 Unspecified acute lower respiratory infection: Secondary | ICD-10-CM

## 2020-12-08 MED ORDER — PROMETHAZINE-DM 6.25-15 MG/5ML PO SYRP: 5.0000 mL | ORAL_SOLUTION | Freq: Four times a day (QID) | ORAL | 0 refills | Status: DC | PRN

## 2020-12-08 MED ORDER — AZITHROMYCIN 250 MG PO TABS: ORAL_TABLET | ORAL | 0 refills | Status: DC

## 2020-12-08 NOTE — ED Triage Notes (Signed)
Pt reports cold symptoms last week and reports continued cough for last several days. Pt denies any known fevers.

## 2020-12-08 NOTE — ED Provider Notes (Signed)
[facility]    CSN: [id number] Arrival date & time: 12/08/20  1456      History   Chief Complaint Chief Complaint  Patient presents with   Cough    HPI Paul Baker is a 70 y.o. male.   Presenting today with over a week of acutely worsening productive cough of thick yellow sputum, chest tightness, shortness of breath, sinus congestion and pressure, fatigue.  Denies fever, chills, body aches, chest pain, abdominal pain, nausea vomiting or diarrhea.  Multiple sick contacts recently.  Taking Mucinex with minimal relief as well as compliantly using his Trelegy and albuterol for asthma.  Past medical history significant for history of asthma, atrial fibrillation, history of DVT and PE on chronic anticoagulation, history of pneumonia, history of diabetes.   Past Medical History:  Diagnosis Date   Arthritis    Asthma    Atrial fibrillation [facility])    Diagnosed December 2021   Colon polyp    Coronary atherosclerosis of native coronary artery    a. 2011: cath showing 90% stenosis along small non-dominant RCA (too small for PCI). b. 01/2018: cath showing nonobstructive CAD with 60 to 70% proximal to mid nondominant RCA stenosis, 50% mid LAD and 40 to 50% OM1   DVT (deep venous thrombosis) ([place]) 2005   Right arm   Essential hypertension    Headache    History of transfusion    Hypothyroidism    Mixed hyperlipidemia    Morbid obesity ([place])    MRSA (methicillin resistant staph aureus) culture positive    08/2012   Narcolepsy    OSA (obstructive sleep apnea)    CPAP   Osteoarthritis    Pneumonia    Psoriasis    Pulmonary embolism ([place]) 2004   RLS (restless legs syndrome)    Rotator cuff disorder    Left   Septic arthritis of knee, left (HCC)    Skin cancer, basal cell    Type 2 diabetes mellitus ([place])     Patient Active Problem List   Diagnosis Date Noted   COVID-19 virus infection 05/21/2020   Acute coronary syndrome without high troponin ([place]) 12/05/2019    Grade I diastolic dysfunction 06/25/7626   Type 2 diabetes mellitus (HCC)    Paroxysmal atrial fibrillation (HCC)    Hypothyroidism    Acute bacterial rhinosinusitis 11/18/2019   Cough 11/18/2019   Paresthesia of skin 08/05/2019   Asthma 08/05/2019   Class 2 obesity 08/05/2019   Right sided weakness 08/05/2019   Vertigo 08/04/2019   Dizziness 02/18/2019   Type 2 diabetes mellitus with hypoglycemia without coma ([place]) 03/31/2017   AKI (acute kidney injury) ([place]) 03/30/2017   Right patellar tendon rupture 12/04/2014   Mixed hyperlipidemia 11/25/2014   Other specified hypothyroidism 11/25/2014   S/P right TKA 03/23/2014   S/P knee replacement 03/23/2014   Spinal stenosis of cervicothoracic region 10/08/2013   RLS (restless legs syndrome) 10/08/2013   DM type 2 causing vascular disease ([place]) 10/08/2013   Preoperative cardiovascular examination 08/14/2013   Sleep apnea with use of continuous positive airway pressure (CPAP) 04/02/2013   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/02/2013   Diabetic neuropathy ([place]) 07/23/2012   Obesity, morbid ([place]) 05/01/2012   Hypersomnia, persistent 05/01/2012   OSA on CPAP 04/19/2012   Chest pain 04/19/2012   Essential hypertension, benign 12/16/2009   CORONARY ATHEROSCLEROSIS NATIVE CORONARY ARTERY 12/16/2009   Hyperglycemia due to type 2 diabetes mellitus ([place]) 11/30/2009   Accelerating angina ([place]) 11/30/2009  Past Surgical History:  Procedure Laterality Date   BACK SURGERY     CATARACT EXTRACTION Bilateral    COLONOSCOPY     CYST REMOVAL TRUNK     from back   EYE SURGERY Left 2016   laser to left eye   HIP SURGERY     bone removed from both sides of hip   KNEE ARTHROTOMY Right 12/04/2014   Procedure: KNEE ARTHROTOMY PATELLA LIGAMENT RECONSTRUSION AND REPAIR RIGHT KNEE;  Surgeon: Paralee Cancel, MD;  Location: [facility];  Service: Orthopedics;  Laterality: Right;   KNEE SURGERY     X 25 TIMES   LEFT HEART CATH AND CORONARY  ANGIOGRAPHY N/A 01/17/2018   Procedure: LEFT HEART CATH AND CORONARY ANGIOGRAPHY;  Surgeon: Belva Crome, MD;  Location: [facility];  Service: Cardiovascular;  Laterality: N/A;   LUMBAR DISC SURGERY     Left L3, L4, L5 discecotomy with decompression of L4 root   TONSILLECTOMY     TOTAL KNEE ARTHROPLASTY  2003   LEFT   TOTAL KNEE ARTHROPLASTY Right 03/23/2014   Procedure: RIGHT TOTAL KNEE ARTHROPLASTY AND REMOVAL RIGHT TIBIAL  DEEP IMPLANT STAPLE;  Surgeon: Paralee Cancel, MD;  Location: [facility];  Service: Orthopedics;  Laterality: Right;   TOTAL KNEE REVISION  2005   LEFT   WRIST SURGERY         Home Medications    Prior to Admission medications   Medication Sig Start Date End Date Taking? Authorizing Provider  azithromycin (ZITHROMAX) 250 MG tablet Take first 2 tablets together, then 1 every day until finished. 12/08/20  Yes Volney American, PA-C  promethazine-dextromethorphan (PROMETHAZINE-DM) 6.25-15 MG/5ML syrup Take 5 mLs by mouth 4 (four) times daily as needed. 12/08/20  Yes Volney American, PA-C  acetaminophen (TYLENOL) 325 MG tablet Take 2 tablets (650 mg total) by mouth every 6 (six) hours as needed for mild pain (or Fever >/= 101). 12/05/19   Roxan Hockey, MD  albuterol (VENTOLIN HFA) 108 (90 Base) MCG/ACT inhaler Inhale 2 puffs into the lungs every 6 (six) hours as needed for wheezing or shortness of breath. 06/03/20   Parrett, Fonnie Mu, NP  apixaban (ELIQUIS) 5 MG TABS tablet Take 1 tablet (5 mg total) by mouth 2 (two) times daily. For stroke prevention 12/05/19   Roxan Hockey, MD  benzonatate (TESSALON) 100 MG capsule Take 1 capsule (100 mg total) by mouth 2 (two) times daily as needed for cough. 05/10/20   Mar Daring, PA-C  Continuous Blood Gluc Receiver (DEXCOM G6 RECEIVER) [place] 1 Piece by Does not apply route as needed. 06/22/20   Brita Romp, NP  Continuous Blood Gluc Sensor (DEXCOM G6 SENSOR) MISC 4 Pieces by Does not apply route once a  week. 05/15/18   Cassandria Anger, MD  Fluticasone-Umeclidin-Vilant (TRELEGY ELLIPTA) 100-62.5-25 MCG/INH AEPB Inhale 1 puff into the lungs daily. 07/13/20   Chesley Mires, MD  Fluticasone-Umeclidin-Vilant (TRELEGY ELLIPTA) 100-62.5-25 MCG/INH AEPB Inhale 1 puff into the lungs daily. 07/13/20   Chesley Mires, MD  furosemide (LASIX) 40 MG tablet Take by mouth. 09/09/20   [provider]  Insulin Pen Needle (B-D ULTRAFINE III SHORT PEN) 31G X 8 MM MISC USE 1 PEN NEEDLE THREE TIMES DAILY 11/01/20   Brita Romp, NP  insulin regular human CONCENTRATED (HUMULIN R U-500 KWIKPEN) 500 UNIT/ML KwikPen Inject 75-85 Units into the skin 3 (three) times daily with meals. Inject 85 units with breakfast and lunch, and 75 units with supper  if glucose is above 90 and you are eating 11/30/20   Brita Romp, NP  levothyroxine (SYNTHROID) 137 MCG tablet TAKE 1 TABLET BY MOUTH ONCE DAILY BEFORE BREAKFAST 11/22/20   Cassandria Anger, MD  loratadine (CLARITIN) 10 MG tablet Take 10 mg by mouth daily as needed for allergies.     [provider]  losartan (COZAAR) 50 MG tablet Take 1 tablet (50 mg total) by mouth daily. 07/21/19   Lovena Le, Malena M, DO  metFORMIN (GLUCOPHAGE) 500 MG tablet TAKE 1 TABLET BY MOUTH TWICE DAILY WITH MEALS 11/22/20   Brita Romp, NP  metoprolol succinate (TOPROL XL) 25 MG 24 hr tablet Take 1 tablet (25 mg total) by mouth See admin instructions. -Take 1 tablet (25 mg) along with an additional 50 mg tablet for total of 75 mg twice daily 10/25/20 10/25/21  Satira Sark, MD  metoprolol succinate (TOPROL-XL) 50 MG 24 hr tablet Take 1 tablet (50 mg total) by mouth 2 (two) times daily. -Take 1 tablet (50 mg) along with an additional 25 mg tablet for total of 75 mg twice daily 12/05/19   Roxan Hockey, MD  modafinil (PROVIGIL) 200 MG tablet Take 1 tablet (200 mg total) by mouth daily. 12/01/20   Lomax, Amy, NP  MYRBETRIQ 50 MG TB24 tablet Take 50 mg by mouth  daily. 11/22/20   [provider]  nitroGLYCERIN (NITROSTAT) 0.4 MG SL tablet DISSOLVE 1 TABLET UNDER TONGUE EVERY 5 MINUTES UP TO 15 MIN FOR CHESTPAIN. IF NO RELIEF CALL 911. 04/05/20   Satira Sark, MD  ONE TOUCH ULTRA TEST test strip TEST BLOOD SUGAR UP TO 4 TIMES DAILY. 10/10/16   Cassandria Anger, MD  ONETOUCH DELICA LANCETS 13K MISC USE AS DIRECTED TO TEST BLOOD SUGAR 4 TIMES DAILY. 10/10/16   Cassandria Anger, MD  pravastatin (PRAVACHOL) 40 MG tablet TAKE 1 TABLET BY MOUTH ONCE DAILY IN THE EVENING 10/25/20   Satira Sark, MD  rOPINIRole (REQUIP XL) 4 MG 24 hr tablet Take 1 tablet (4 mg total) by mouth at bedtime. 12/01/20   Lomax, Amy, NP  rOPINIRole (REQUIP) 4 MG tablet Take 0.5 tablets (2 mg total) by mouth 2 (two) times daily. 12/01/20   Lomax, Amy, NP  Semaglutide,0.25 or 0.5MG /DOS, (OZEMPIC, 0.25 OR 0.5 MG/DOSE,) 2 MG/1.5ML SOPN Inject 0.5 mg into the skin once a week. 11/30/20   Brita Romp, NP    Family History Family History  Problem Relation Age of Onset   Hypertension Father    Heart attack Father    Kidney Stones Father    Seizures Grandchild    Narcolepsy Grandchild    Diabetes Sister     Social History Social History   Tobacco Use   Smoking status: Former    Packs/day: 1.50    Years: 10.00    Pack years: 15.00    Types: Cigarettes    Start date: 06/19/1967    Quit date: 01/03/1995    Years since quitting: 25.9   Smokeless tobacco: Never  Vaping Use   Vaping Use: Never used  Substance Use Topics   Alcohol use: No    Alcohol/week: 0.0 standard drinks    Comment: quit drinking in 07/86   Drug use: No     Allergies   Tape, Cardizem [diltiazem hcl], Cardura [doxazosin mesylate], Lipitor [atorvastatin], Paxil [paroxetine hcl], Codeine, and Gabapentin   Review of Systems Review of Systems Per HPI  Physical Exam Triage Vital Signs ED Triage  Vitals  Enc Vitals Group     BP 12/08/20 1556 132/76     Pulse Rate 12/08/20  1556 83     Resp 12/08/20 1556 18     Temp 12/08/20 1556 98.6 F (37 C)     Temp Source 12/08/20 1556 Oral     SpO2 12/08/20 1556 95 %     Weight 12/08/20 1557 220 lb (99.8 kg)     Height 12/08/20 1557 5\' 5"  (1.651 m)     Head Circumference --      Peak Flow --      Pain Score 12/08/20 1557 4     Pain Loc --      Pain Edu? --      Excl. in [place]? --    No data found.  Updated Vital Signs BP 132/76 (BP Location: Right Arm)   Pulse 83   Temp 98.6 F (37 C) (Oral)   Resp 18   Ht 5\' 5"  (1.651 m)   Wt 220 lb (99.8 kg)   SpO2 95%   BMI 36.61 kg/m   Visual Acuity Right Eye Distance:   Left Eye Distance:   Bilateral Distance:    Right Eye Near:   Left Eye Near:    Bilateral Near:     Physical Exam Vitals and nursing note reviewed.  Constitutional:      Appearance: He is well-developed.  HENT:     Head: Atraumatic.     Right Ear: External ear normal.     Left Ear: External ear normal.     Nose: Congestion present.     Mouth/Throat:     Pharynx: Posterior oropharyngeal erythema present. No oropharyngeal exudate.  Eyes:     Conjunctiva/sclera: Conjunctivae normal.     Pupils: Pupils are equal, round, and reactive to light.  Cardiovascular:     Rate and Rhythm: Normal rate and regular rhythm.  Pulmonary:     Effort: Pulmonary effort is normal. No respiratory distress.     Breath sounds: Wheezing present. No rales.  Musculoskeletal:        General: Normal range of motion.     Cervical back: Normal range of motion and neck supple.  Lymphadenopathy:     Cervical: No cervical adenopathy.  Skin:    General: Skin is warm and dry.  Neurological:     Mental Status: He is alert and oriented to person, place, and time.  Psychiatric:        Mood and Affect: Mood normal.        Behavior: Behavior normal.        Thought Content: Thought content normal.        Judgment: Judgment normal.     UC Treatments / Results  Labs (all labs ordered are listed, but only abnormal  results are displayed) Labs Reviewed  COVID-19, FLU A+B NAA    EKG   Radiology No results found.  Procedures Procedures (including critical care time)  Medications Ordered in UC Medications - No data to display  Initial Impression / Assessment and Plan / UC Course  I have reviewed the triage vital signs and the nursing notes.  Pertinent labs & imaging results that were available during my care of the patient were reviewed by me and considered in my medical decision making (see chart for details).     COVID and flu testing pending but given his significant underlying risk factors, chronicity and worsening course of symptoms will cover for bacterial lower respiratory infection  with azithromycin, Phenergan DM and to continue asthma regimen with multiple inhalers.  Strict return precautions given for worsening symptoms.  Final Clinical Impressions(s) / UC Diagnoses   Final diagnoses:  Cough, unspecified type  Lower respiratory infection  Moderate persistent asthma with acute exacerbation   Discharge Instructions   None    ED Prescriptions     Medication Sig Dispense Auth. Provider   azithromycin (ZITHROMAX) 250 MG tablet Take first 2 tablets together, then 1 every day until finished. 6 tablet Volney American, [place]   promethazine-dextromethorphan (PROMETHAZINE-DM) 6.25-15 MG/5ML syrup Take 5 mLs by mouth 4 (four) times daily as needed. 100 mL Volney American, [place]      PDMP not reviewed this encounter.   Volney American, [place] 12/08/20 1741

## 2020-12-09 LAB — COVID-19, FLU A+B NAA
Influenza A, NAA: NOT DETECTED
Influenza B, NAA: NOT DETECTED
SARS-CoV-2, NAA: NOT DETECTED

## 2020-12-14 ENCOUNTER — Encounter: Payer: Self-pay | Admitting: Family Medicine

## 2020-12-14 ENCOUNTER — Other Ambulatory Visit: Payer: Self-pay

## 2020-12-14 ENCOUNTER — Ambulatory Visit (INDEPENDENT_AMBULATORY_CARE_PROVIDER_SITE_OTHER): Payer: Medicare HMO | Admitting: Family Medicine

## 2020-12-14 VITALS — HR 90 | Temp 97.6°F | Wt 225.4 lb

## 2020-12-14 DIAGNOSIS — R051 Acute cough: Secondary | ICD-10-CM | POA: Diagnosis not present

## 2020-12-14 DIAGNOSIS — B9689 Other specified bacterial agents as the cause of diseases classified elsewhere: Secondary | ICD-10-CM

## 2020-12-14 DIAGNOSIS — J019 Acute sinusitis, unspecified: Secondary | ICD-10-CM

## 2020-12-14 MED ORDER — AMOXICILLIN-POT CLAVULANATE 875-125 MG PO TABS: 1.0000 | ORAL_TABLET | Freq: Two times a day (BID) | ORAL | 0 refills | Status: DC

## 2020-12-14 NOTE — Progress Notes (Signed)
° °  Subjective:    Patient ID: Paul Baker, male    DOB: 11-Dec-1950, 70 y.o.   MRN: [id number]  Cough This is a new problem. The current episode started in the past 7 days. Associated symptoms include ear pain, headaches and nasal congestion.  Patient with ongoing coughing congestion chest congestion denies any difficulty breathing no wheezing no high fever or chills but did have fever over the weekend Given z pack 12/08/20 thru urgent care Triple swab negative thru urgent care  Review of Systems  HENT:  Positive for ear pain.   Respiratory:  Positive for cough.   Neurological:  Positive for headaches.      Objective:   Physical Exam  Gen-NAD not toxic TMS-normal bilateral T- normal no redness Chest-CTA respiratory rate normal no crackles CV RRR no murmur Skin-warm dry Neuro-grossly normal       Assessment & Plan:  On today's exam I do not find evidence of pneumonia but does have bronchial congestion as well as head congestion Treat with Augmentin over the next 10 days to cover for secondary infection in the lungs as well as sinusitis I do not feel lab work and x-rays are indicated currently but if gets worse to notify us If not improving over the next 7 to 10 days notify us may need lab work and x-rays

## 2020-12-18 ENCOUNTER — Encounter (HOSPITAL_COMMUNITY): Payer: Self-pay | Admitting: *Deleted

## 2020-12-18 ENCOUNTER — Emergency Department (HOSPITAL_COMMUNITY): Payer: Medicare HMO

## 2020-12-18 ENCOUNTER — Emergency Department (HOSPITAL_COMMUNITY)
Admission: EM | Admit: 2020-12-18 | Discharge: 2020-12-18 | Disposition: A | Discharge status: Home / Self Care | Payer: Medicare HMO | Attending: Emergency Medicine | Admitting: Emergency Medicine

## 2020-12-18 ENCOUNTER — Other Ambulatory Visit: Payer: Self-pay

## 2020-12-18 DIAGNOSIS — I4891 Unspecified atrial fibrillation: Secondary | ICD-10-CM | POA: Insufficient documentation

## 2020-12-18 DIAGNOSIS — Z794 Long term (current) use of insulin: Secondary | ICD-10-CM | POA: Insufficient documentation

## 2020-12-18 DIAGNOSIS — Z79899 Other long term (current) drug therapy: Secondary | ICD-10-CM | POA: Diagnosis not present

## 2020-12-18 DIAGNOSIS — Z8616 Personal history of COVID-19: Secondary | ICD-10-CM | POA: Diagnosis not present

## 2020-12-18 DIAGNOSIS — E1159 Type 2 diabetes mellitus with other circulatory complications: Secondary | ICD-10-CM | POA: Insufficient documentation

## 2020-12-18 DIAGNOSIS — Z7951 Long term (current) use of inhaled steroids: Secondary | ICD-10-CM | POA: Diagnosis not present

## 2020-12-18 DIAGNOSIS — I2510 Atherosclerotic heart disease of native coronary artery without angina pectoris: Secondary | ICD-10-CM | POA: Diagnosis not present

## 2020-12-18 DIAGNOSIS — Z7901 Long term (current) use of anticoagulants: Secondary | ICD-10-CM | POA: Diagnosis not present

## 2020-12-18 DIAGNOSIS — E039 Hypothyroidism, unspecified: Secondary | ICD-10-CM | POA: Insufficient documentation

## 2020-12-18 DIAGNOSIS — J45909 Unspecified asthma, uncomplicated: Secondary | ICD-10-CM | POA: Diagnosis not present

## 2020-12-18 DIAGNOSIS — R059 Cough, unspecified: Secondary | ICD-10-CM | POA: Diagnosis not present

## 2020-12-18 DIAGNOSIS — Z87891 Personal history of nicotine dependence: Secondary | ICD-10-CM | POA: Insufficient documentation

## 2020-12-18 DIAGNOSIS — I10 Essential (primary) hypertension: Secondary | ICD-10-CM | POA: Diagnosis not present

## 2020-12-18 DIAGNOSIS — Z20822 Contact with and (suspected) exposure to covid-19: Secondary | ICD-10-CM | POA: Diagnosis not present

## 2020-12-18 DIAGNOSIS — Z955 Presence of coronary angioplasty implant and graft: Secondary | ICD-10-CM | POA: Diagnosis not present

## 2020-12-18 DIAGNOSIS — E1140 Type 2 diabetes mellitus with diabetic neuropathy, unspecified: Secondary | ICD-10-CM | POA: Diagnosis not present

## 2020-12-18 DIAGNOSIS — Z7984 Long term (current) use of oral hypoglycemic drugs: Secondary | ICD-10-CM | POA: Insufficient documentation

## 2020-12-18 DIAGNOSIS — J069 Acute upper respiratory infection, unspecified: Secondary | ICD-10-CM | POA: Diagnosis not present

## 2020-12-18 DIAGNOSIS — R509 Fever, unspecified: Secondary | ICD-10-CM | POA: Diagnosis present

## 2020-12-18 DIAGNOSIS — Z96653 Presence of artificial knee joint, bilateral: Secondary | ICD-10-CM | POA: Diagnosis not present

## 2020-12-18 LAB — CBC WITH DIFFERENTIAL/PLATELET
Abs Immature Granulocytes: 0.03 10*3/uL (ref 0.00–0.07)
Basophils Absolute: 0 10*3/uL (ref 0.0–0.1)
Basophils Relative: 1 %
Eosinophils Absolute: 0.3 10*3/uL (ref 0.0–0.5)
Eosinophils Relative: 4 %
HCT: 39.6 % (ref 39.0–52.0)
Hemoglobin: 12.8 g/dL — ABNORMAL LOW (ref 13.0–17.0)
Immature Granulocytes: 1 %
Lymphocytes Relative: 26 %
Lymphs Abs: 1.7 10*3/uL (ref 0.7–4.0)
MCH: 29.6 pg (ref 26.0–34.0)
MCHC: 32.3 g/dL (ref 30.0–36.0)
MCV: 91.7 fL (ref 80.0–100.0)
Monocytes Absolute: 0.8 10*3/uL (ref 0.1–1.0)
Monocytes Relative: 12 %
Neutro Abs: 3.7 10*3/uL (ref 1.7–7.7)
Neutrophils Relative %: 56 %
Platelets: 206 10*3/uL (ref 150–400)
RBC: 4.32 MIL/uL (ref 4.22–5.81)
RDW: 13.4 % (ref 11.5–15.5)
WBC: 6.4 10*3/uL (ref 4.0–10.5)
nRBC: 0 % (ref 0.0–0.2)

## 2020-12-18 LAB — BASIC METABOLIC PANEL
Anion gap: 11 (ref 5–15)
BUN: 32 mg/dL — ABNORMAL HIGH (ref 8–23)
CO2: 26 mmol/L (ref 22–32)
Calcium: 9.3 mg/dL (ref 8.9–10.3)
Chloride: 99 mmol/L (ref 98–111)
Creatinine, Ser: 1.13 mg/dL (ref 0.61–1.24)
GFR, Estimated: >60 mL/min (ref >60)
Glucose, Bld: 324 mg/dL — ABNORMAL HIGH (ref 70–99)
Potassium: 4.1 mmol/L (ref 3.5–5.1)
Sodium: 136 mmol/L (ref 135–145)

## 2020-12-18 LAB — RESP PANEL BY RT-PCR (FLU A&B, COVID) ARPGX2
Influenza A by PCR: NEGATIVE
Influenza B by PCR: NEGATIVE
SARS Coronavirus 2 by RT PCR: NEGATIVE

## 2020-12-18 LAB — D-DIMER, QUANTITATIVE: D-Dimer, Quant: 0.45 ug/mL-FEU (ref 0.00–0.50)

## 2020-12-18 MED ORDER — IPRATROPIUM-ALBUTEROL 0.5-2.5 (3) MG/3ML IN SOLN
3.0000 mL | Freq: Once | RESPIRATORY_TRACT | Status: AC
  Administered 2020-12-18: 3 mL via RESPIRATORY_TRACT
  Filled 2020-12-18: qty 3

## 2020-12-18 MED ORDER — ALBUTEROL SULFATE (2.5 MG/3ML) 0.083% IN NEBU: 2.5000 mg | INHALATION_SOLUTION | Freq: Four times a day (QID) | RESPIRATORY_TRACT | 0 refills | Status: DC | PRN

## 2020-12-18 MED ORDER — ALBUTEROL SULFATE (2.5 MG/3ML) 0.083% IN NEBU
2.5000 mg | INHALATION_SOLUTION | Freq: Once | RESPIRATORY_TRACT | Status: AC
  Administered 2020-12-18: 2.5 mg via RESPIRATORY_TRACT
  Filled 2020-12-18: qty 3

## 2020-12-18 NOTE — ED Triage Notes (Signed)
Cold symptoms for 2 weeks, states he has taken 2 rounds of antibiotics without relief, hurts to cough

## 2020-12-18 NOTE — Discharge Instructions (Signed)
Likely a viral infection, recommend over-the-counter pain medications like ibuprofen Tylenol for fever and pain control, nasal decongestions like Flonase and Zyrtec, Mucinex for cough.  If not eating recommend supplementing with Gatorade to help with electrolyte supplementation.  I have also given you albuterol nebulizer please use every 4-6 as needed for shortness of breath, please wear this can increase your heart rate.   Follow-up PCP for further evaluation.  Come back to the emergency department if you develop chest pain, shortness of breath, severe abdominal pain, uncontrolled nausea, vomiting, diarrhea.

## 2020-12-18 NOTE — ED Notes (Signed)
Sats 97-99% during ambulation.  Not distress noted.

## 2020-12-18 NOTE — ED Provider Notes (Signed)
[facility] EMERGENCY DEPARTMENT Provider Note   CSN: [id number] Arrival date & time: 12/18/20  1258     History Chief Complaint  Patient presents with   URI    Paul Baker is a 70 y.o. male.  HPI  Patient with medical history including A. fib, PEs, asthma, diabetes, currently on Eliquis presents to the emergency department with chief complaint of URI-like symptoms.  Patient states symptoms started about 2 weeks ago, he states that he had 1 episode of fevers and chills but this is since resolved, now he endorses congestion, sore throat, slightly productive cough, chest tightness, shortness of breath, and decrease in appetite.  States that he still tolerating p.o., he has no stomach pain, nausea, vomiting, states an episode of diarrhea but states this is probably from the antibiotics that he is on, states that he went to his urgent care who initially started him on azithromycin, he had a respiratory panel that was negative that time, he finished this but was still not feeling well he then went to his primary care office on the 13th where they started him on Augmentin, he still is having cough and feeling short of breath.  He denies pleuritic chest pain, unilateral pedal edema, calf pain, states he has been compliant with his Eliquis, he also notes that he has been taking his inhalers which has been helping, he cannot take prednisone because this increases his blood sugar.  He denies any alleviating factors.  He is vaccinated against COVID-19 as well as the flu, has no other complaints at this time.  Past Medical History:  Diagnosis Date   Arthritis    Asthma    Atrial fibrillation [facility])    Diagnosed December 2021   Colon polyp    Coronary atherosclerosis of native coronary artery    a. 2011: cath showing 90% stenosis along small non-dominant RCA (too small for PCI). b. 01/2018: cath showing nonobstructive CAD with 60 to 70% proximal to mid nondominant RCA stenosis, 50% mid LAD and  40 to 50% OM1   DVT (deep venous thrombosis) ([place]) 2005   Right arm   Essential hypertension    Headache    History of transfusion    Hypothyroidism    Mixed hyperlipidemia    Morbid obesity ([place])    MRSA (methicillin resistant staph aureus) culture positive    08/2012   Narcolepsy    OSA (obstructive sleep apnea)    CPAP   Osteoarthritis    Pneumonia    Psoriasis    Pulmonary embolism ([place]) 2004   RLS (restless legs syndrome)    Rotator cuff disorder    Left   Septic arthritis of knee, left (HCC)    Skin cancer, basal cell    Type 2 diabetes mellitus ([place])     Patient Active Problem List   Diagnosis Date Noted   COVID-19 virus infection 05/21/2020   Acute coronary syndrome without high troponin ([place]) 12/05/2019   Grade I diastolic dysfunction 51/02/5850   Type 2 diabetes mellitus (HCC)    Paroxysmal atrial fibrillation (HCC)    Hypothyroidism    Acute bacterial rhinosinusitis 11/18/2019   Cough 11/18/2019   Paresthesia of skin 08/05/2019   Asthma 08/05/2019   Class 2 obesity 08/05/2019   Right sided weakness 08/05/2019   Vertigo 08/04/2019   Dizziness 02/18/2019   Type 2 diabetes mellitus with hypoglycemia without coma ([place]) 03/31/2017   AKI (acute kidney injury) ([place]) 03/30/2017   Right patellar tendon rupture 12/04/2014  Mixed hyperlipidemia 11/25/2014   Other specified hypothyroidism 11/25/2014   S/P right TKA 03/23/2014   S/P knee replacement 03/23/2014   Spinal stenosis of cervicothoracic region 10/08/2013   RLS (restless legs syndrome) 10/08/2013   DM type 2 causing vascular disease ([place]) 10/08/2013   Preoperative cardiovascular examination 08/14/2013   Sleep apnea with use of continuous positive airway pressure (CPAP) 04/02/2013   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/02/2013   Diabetic neuropathy ([place]) 07/23/2012   Obesity, morbid ([place]) 05/01/2012   Hypersomnia, persistent 05/01/2012   OSA on CPAP 04/19/2012   Chest pain 04/19/2012    Essential hypertension, benign 12/16/2009   CORONARY ATHEROSCLEROSIS NATIVE CORONARY ARTERY 12/16/2009   Hyperglycemia due to type 2 diabetes mellitus ([place]) 11/30/2009   Accelerating angina ([place]) 11/30/2009    Past Surgical History:  Procedure Laterality Date   BACK SURGERY     CATARACT EXTRACTION Bilateral    COLONOSCOPY     CYST REMOVAL TRUNK     from back   EYE SURGERY Left 2016   laser to left eye   HIP SURGERY     bone removed from both sides of hip   KNEE ARTHROTOMY Right 12/04/2014   Procedure: KNEE ARTHROTOMY PATELLA LIGAMENT RECONSTRUSION AND REPAIR RIGHT KNEE;  Surgeon: Paralee Cancel, MD;  Location: [facility];  Service: Orthopedics;  Laterality: Right;   KNEE SURGERY     X 25 TIMES   LEFT HEART CATH AND CORONARY ANGIOGRAPHY N/A 01/17/2018   Procedure: LEFT HEART CATH AND CORONARY ANGIOGRAPHY;  Surgeon: Belva Crome, MD;  Location: [facility];  Service: Cardiovascular;  Laterality: N/A;   LUMBAR DISC SURGERY     Left L3, L4, L5 discecotomy with decompression of L4 root   TONSILLECTOMY     TOTAL KNEE ARTHROPLASTY  2003   LEFT   TOTAL KNEE ARTHROPLASTY Right 03/23/2014   Procedure: RIGHT TOTAL KNEE ARTHROPLASTY AND REMOVAL RIGHT TIBIAL  DEEP IMPLANT STAPLE;  Surgeon: Paralee Cancel, MD;  Location: [facility];  Service: Orthopedics;  Laterality: Right;   TOTAL KNEE REVISION  2005   LEFT   WRIST SURGERY         Family History  Problem Relation Age of Onset   Hypertension Father    Heart attack Father    Kidney Stones Father    Seizures Grandchild    Narcolepsy Grandchild    Diabetes Sister     Social History   Tobacco Use   Smoking status: Former    Packs/day: 1.50    Years: 10.00    Pack years: 15.00    Types: Cigarettes    Start date: 06/19/1967    Quit date: 01/03/1995    Years since quitting: 25.9   Smokeless tobacco: Never  Vaping Use   Vaping Use: Never used  Substance Use Topics   Alcohol use: No    Alcohol/week: 0.0 standard drinks    Comment:  quit drinking in 07/86   Drug use: No    Home Medications Prior to Admission medications   Medication Sig Start Date End Date Taking? Authorizing Provider  albuterol (PROVENTIL) (2.5 MG/3ML) 0.083% nebulizer solution Take 3 mLs (2.5 mg total) by nebulization every 6 (six) hours as needed for up to 7 days for wheezing or shortness of breath. 12/18/20 12/25/20 Yes Marcello Fennel, PA-C  acetaminophen (TYLENOL) 325 MG tablet Take 2 tablets (650 mg total) by mouth every 6 (six) hours as needed for mild pain (or Fever >/= 101). 12/05/19  Roxan Hockey, MD  albuterol (VENTOLIN HFA) 108 (90 Base) MCG/ACT inhaler Inhale 2 puffs into the lungs every 6 (six) hours as needed for wheezing or shortness of breath. 06/03/20   Parrett, Fonnie Mu, NP  amoxicillin-clavulanate (AUGMENTIN) 875-125 MG tablet Take 1 tablet by mouth 2 (two) times daily. 12/14/20   Kathyrn Drown, MD  apixaban (ELIQUIS) 5 MG TABS tablet Take 1 tablet (5 mg total) by mouth 2 (two) times daily. For stroke prevention 12/05/19   Roxan Hockey, MD  benzonatate (TESSALON) 100 MG capsule Take 1 capsule (100 mg total) by mouth 2 (two) times daily as needed for cough. 05/10/20   Mar Daring, PA-C  Continuous Blood Gluc Receiver (DEXCOM G6 RECEIVER) [place] 1 Piece by Does not apply route as needed. 06/22/20   Brita Romp, NP  Continuous Blood Gluc Sensor (DEXCOM G6 SENSOR) MISC 4 Pieces by Does not apply route once a week. 05/15/18   Cassandria Anger, MD  Fluticasone-Umeclidin-Vilant (TRELEGY ELLIPTA) 100-62.5-25 MCG/INH AEPB Inhale 1 puff into the lungs daily. 07/13/20   Chesley Mires, MD  Fluticasone-Umeclidin-Vilant (TRELEGY ELLIPTA) 100-62.5-25 MCG/INH AEPB Inhale 1 puff into the lungs daily. 07/13/20   Chesley Mires, MD  furosemide (LASIX) 40 MG tablet Take by mouth. 09/09/20   [provider]  Insulin Pen Needle (B-D ULTRAFINE III SHORT PEN) 31G X 8 MM MISC USE 1 PEN NEEDLE THREE TIMES DAILY 11/01/20   Brita Romp, NP  insulin regular human CONCENTRATED (HUMULIN R U-500 KWIKPEN) 500 UNIT/ML KwikPen Inject 75-85 Units into the skin 3 (three) times daily with meals. Inject 85 units with breakfast and lunch, and 75 units with supper if glucose is above 90 and you are eating 11/30/20   Brita Romp, NP  levothyroxine (SYNTHROID) 137 MCG tablet TAKE 1 TABLET BY MOUTH ONCE DAILY BEFORE BREAKFAST 11/22/20   Cassandria Anger, MD  loratadine (CLARITIN) 10 MG tablet Take 10 mg by mouth daily as needed for allergies.     [provider]  losartan (COZAAR) 50 MG tablet Take 1 tablet (50 mg total) by mouth daily. 07/21/19   Lovena Le, Malena M, DO  metFORMIN (GLUCOPHAGE) 500 MG tablet TAKE 1 TABLET BY MOUTH TWICE DAILY WITH MEALS 11/22/20   Brita Romp, NP  metoprolol succinate (TOPROL XL) 25 MG 24 hr tablet Take 1 tablet (25 mg total) by mouth See admin instructions. -Take 1 tablet (25 mg) along with an additional 50 mg tablet for total of 75 mg twice daily 10/25/20 10/25/21  Satira Sark, MD  metoprolol succinate (TOPROL-XL) 50 MG 24 hr tablet Take 1 tablet (50 mg total) by mouth 2 (two) times daily. -Take 1 tablet (50 mg) along with an additional 25 mg tablet for total of 75 mg twice daily 12/05/19   Roxan Hockey, MD  modafinil (PROVIGIL) 200 MG tablet Take 1 tablet (200 mg total) by mouth daily. 12/01/20   Lomax, Amy, NP  MYRBETRIQ 50 MG TB24 tablet Take 50 mg by mouth daily. 11/22/20   [provider]  nitroGLYCERIN (NITROSTAT) 0.4 MG SL tablet DISSOLVE 1 TABLET UNDER TONGUE EVERY 5 MINUTES UP TO 15 MIN FOR CHESTPAIN. IF NO RELIEF CALL 911. 04/05/20   Satira Sark, MD  ONE TOUCH ULTRA TEST test strip TEST BLOOD SUGAR UP TO 4 TIMES DAILY. 10/10/16   Cassandria Anger, MD  ONETOUCH DELICA LANCETS 58K MISC USE AS DIRECTED TO TEST BLOOD SUGAR 4 TIMES DAILY. 10/10/16   Loni Beckwith  W, MD  pravastatin (PRAVACHOL) 40 MG tablet TAKE 1 TABLET BY MOUTH ONCE DAILY IN THE  EVENING 10/25/20   Satira Sark, MD  promethazine-dextromethorphan (PROMETHAZINE-DM) 6.25-15 MG/5ML syrup Take 5 mLs by mouth 4 (four) times daily as needed. 12/08/20   Volney American, PA-C  rOPINIRole (REQUIP XL) 4 MG 24 hr tablet Take 1 tablet (4 mg total) by mouth at bedtime. 12/01/20   Lomax, Amy, NP  rOPINIRole (REQUIP) 4 MG tablet Take 0.5 tablets (2 mg total) by mouth 2 (two) times daily. 12/01/20   Lomax, Amy, NP  Semaglutide,0.25 or 0.5MG /DOS, (OZEMPIC, 0.25 OR 0.5 MG/DOSE,) 2 MG/1.5ML SOPN Inject 0.5 mg into the skin once a week. 11/30/20   Brita Romp, NP    Allergies    Tape, Cardizem [diltiazem hcl], Cardura [doxazosin mesylate], Lipitor [atorvastatin], Paxil [paroxetine hcl], Codeine, and Gabapentin  Review of Systems   Review of Systems  Constitutional:  Positive for appetite change. Negative for chills and fever.  HENT:  Positive for congestion and sore throat. Negative for trouble swallowing.   Respiratory:  Positive for cough, chest tightness and shortness of breath.   Cardiovascular:  Negative for chest pain.  Gastrointestinal:  Negative for abdominal pain, diarrhea, nausea and vomiting.  Genitourinary:  Negative for enuresis.  Musculoskeletal:  Negative for back pain and myalgias.  Skin:  Negative for rash.  Neurological:  Negative for dizziness and headaches.  Hematological:  Does not bruise/bleed easily.   Physical Exam Updated Vital Signs BP (!) 142/74    Pulse 80    Temp 97.7 F (36.5 C) (Oral)    Resp 20    SpO2 98%   Physical Exam Vitals and nursing note reviewed.  Constitutional:      General: He is not in acute distress.    Appearance: He is not ill-appearing.  HENT:     Head: Normocephalic and atraumatic.     Right Ear: Tympanic membrane, ear canal and external ear normal.     Left Ear: Tympanic membrane, ear canal and external ear normal.     Nose: Congestion present.     Comments: Erythematous turbinates bilaterally.     Mouth/Throat:     Mouth: Mucous membranes are moist.     Pharynx: Oropharynx is clear. No oropharyngeal exudate or posterior oropharyngeal erythema.  Eyes:     Conjunctiva/sclera: Conjunctivae normal.  Cardiovascular:     Rate and Rhythm: Normal rate and regular rhythm.     Pulses: Normal pulses.     Heart sounds: No murmur heard.   No friction rub. No gallop.  Pulmonary:     Effort: No respiratory distress.     Breath sounds: No wheezing, rhonchi or rales.     Comments: No evidence of respiratory distress, able to speak in full sentences, he is nontachypneic, nonhypoxic, patient slight tight sounding chest without wheezing rhonchi or stridor present. Abdominal:     Palpations: Abdomen is soft.     Tenderness: There is no abdominal tenderness. There is no right CVA tenderness or left CVA tenderness.  Musculoskeletal:     Right lower leg: No edema.     Left lower leg: No edema.  Skin:    General: Skin is warm and dry.  Neurological:     Mental Status: He is alert.  Psychiatric:        Mood and Affect: Mood normal.    ED Results / Procedures / Treatments   Labs (all labs ordered are listed, but only  abnormal results are displayed) Labs Reviewed  BASIC METABOLIC PANEL - Abnormal; Notable for the following components:      Result Value   Glucose, Bld 324 (*)    BUN 32 (*)    All other components within normal limits  CBC WITH DIFFERENTIAL/PLATELET - Abnormal; Notable for the following components:   Hemoglobin 12.8 (*)    All other components within normal limits  RESP PANEL BY RT-PCR (FLU A&B, COVID) [id number]  D-DIMER, QUANTITATIVE    EKG EKG Interpretation  Date/Time:  Saturday December 18 2020 13:24:01 EST Ventricular Rate:  82 PR Interval:  170 QRS Duration: 80 QT Interval:  382 QTC Calculation: 446 R Axis:   75 Text Interpretation: Normal sinus rhythm Inferior infarct , age undetermined Abnormal ECG Similar to prior tracing from May 2022 Confirmed by Nanda Quinton  [phone]) on 12/18/2020 1:27:15 PM  Radiology DG Chest Port 1 View  Result Date: 12/18/2020 CLINICAL DATA:  Cough EXAM: PORTABLE CHEST 1 VIEW COMPARISON:  Chest x-ray 06/03/2020 FINDINGS: Heart size and mediastinal contours are within normal limits. No suspicious pulmonary opacities identified. No pleural effusion or pneumothorax visualized. No acute osseous abnormality appreciated. IMPRESSION: No acute intrathoracic process identified. Electronically Signed   By: Ofilia Neas M.D.   On: 12/18/2020 13:59    Procedures Procedures   Medications Ordered in ED Medications  ipratropium-albuterol (DUONEB) 0.5-2.5 (3) MG/3ML nebulizer solution 3 mL (3 mLs Nebulization Given 12/18/20 1424)  albuterol (PROVENTIL) (2.5 MG/3ML) 0.083% nebulizer solution 2.5 mg (2.5 mg Nebulization Given 12/18/20 1604)    ED Course  I have reviewed the triage vital signs and the nursing notes.  Pertinent labs & imaging results that were available during my care of the patient were reviewed by me and considered in my medical decision making (see chart for details).    MDM Rules/Calculators/A&P                         Initial impression-presents with URI-like symptoms, alert, no acute stress, vital signs reassuring.  Likely this is a viral process he, will obtain basic lab work-up, provide him with DuoNeb and reassess.  Work-up-respiratory panel negative, chest x-ray unremarkable, EKG sinus without signs of ischemia.  Reassessment-reassessed after first DuoNeb, states he is feeling slightly better chest sounds were improved but still tight, will provide with additional nebulizer treatment, will also add on D-dimer for rule out of PE.  Reassessed after the second nebulizer treatment lung sounds have improved, he states he feels much better, he was ambulated remained nonhypoxic [place] short of breath patient states he is ready for discharge.  Rule out- Low suspicion for systemic infection as patient is  nontoxic-appearing, vital signs reassuring, no obvious source infection noted on exam.  Low suspicion for pneumonia as lung sounds are clear bilaterally, x-ray did not reveal any acute findings.  I have low suspicion for PE as patient denies pleuritic chest pain, D-dimer is negative, he is also on Eliquis making this extremely unlikely. low suspicion for strep throat as oropharynx was visualized, no erythema or exudates noted.  Low suspicion patient would need  hospitalized due to viral infection or Covid as vital signs reassuring, patient is not in respiratory distress.    Plan-  URI-I suspect this is likely viral, will recommend symptom management, will provide him with nebulizing treatment since he cannot do steroids, informed him that he can continue with his antibiotics as he has been on for last 3 days possibly  could have had an underlying bacterial infection which has resolved itself.  Follow-up as needed given strict return precautions.  Vital signs have remained stable, no indication for hospital admission.  Patient discussed with attending and they agreed with assessment and plan.  Patient given at home care as well strict return precautions.  Patient verbalized that they understood agreed to said plan.     Final Clinical Impression(s) / ED Diagnoses Final diagnoses:  Upper respiratory tract infection, unspecified type    Rx / DC Orders ED Discharge Orders          Ordered    albuterol (PROVENTIL) (2.5 MG/3ML) 0.083% nebulizer solution  Every 6 hours PRN        12/18/20 1629             Marcello Fennel, PA-C 12/18/20 1631    Long, Wonda [facility], MD 12/19/20 [phone]

## 2020-12-20 ENCOUNTER — Other Ambulatory Visit: Payer: Self-pay

## 2020-12-20 ENCOUNTER — Encounter: Payer: Self-pay | Admitting: Cardiology

## 2020-12-20 ENCOUNTER — Ambulatory Visit: Payer: Medicare HMO | Admitting: Cardiology

## 2020-12-20 VITALS — BP 140/70 | HR 75 | Ht 65.0 in | Wt 217.2 lb

## 2020-12-20 DIAGNOSIS — I25119 Atherosclerotic heart disease of native coronary artery with unspecified angina pectoris: Secondary | ICD-10-CM | POA: Diagnosis not present

## 2020-12-20 DIAGNOSIS — I480 Paroxysmal atrial fibrillation: Secondary | ICD-10-CM | POA: Diagnosis not present

## 2020-12-20 NOTE — Patient Instructions (Signed)

## 2020-12-20 NOTE — Progress Notes (Signed)
Cardiology Office Note  Date: 12/20/2020   ID: Paul Baker, DOB 03-19-1950, MRN [id number]  PCP:  Kathyrn Drown, MD  Cardiologist:  [name], MD Electrophysiologist:  None   Chief Complaint  Patient presents with   Cardiac follow-up    History of Present Illness: Paul Baker is a 70 y.o. male last seen in April.  He is here for a follow-up visit.  From a cardiac perspective, he does not report any progressive angina symptoms or nitroglycerin use.  He has had a recent URI, negative for COVID-19 and influenza.  He was seen recently in the ER, I reviewed the notes.  No change in cardiac regimen which is reviewed below.  He does not report any spontaneous bleeding problems on Eliquis.  I did review his recent lab work.  He also had a recent ECG which I reviewed.  Past Medical History:  Diagnosis Date   Arthritis    Asthma    Atrial fibrillation [facility])    Diagnosed December 2021   Colon polyp    Coronary atherosclerosis of native coronary artery    a. 2011: cath showing 90% stenosis along small non-dominant RCA (too small for PCI). b. 01/2018: cath showing nonobstructive CAD with 60 to 70% proximal to mid nondominant RCA stenosis, 50% mid LAD and 40 to 50% OM1   DVT (deep venous thrombosis) ([address]) 2005   Right arm   Essential hypertension    Headache    History of transfusion    Hypothyroidism    Mixed hyperlipidemia    Morbid obesity ([place])    MRSA (methicillin resistant staph aureus) culture positive    08/2012   Narcolepsy    OSA (obstructive sleep apnea)    CPAP   Osteoarthritis    Pneumonia    Psoriasis    Pulmonary embolism ([place]) 2004   RLS (restless legs syndrome)    Rotator cuff disorder    Left   Septic arthritis of knee, left (HCC)    Skin cancer, basal cell    Type 2 diabetes mellitus ([place])     Past Surgical History:  Procedure Laterality Date   BACK SURGERY     CATARACT EXTRACTION Bilateral    COLONOSCOPY     CYST REMOVAL TRUNK      from back   EYE SURGERY Left 2016   laser to left eye   HIP SURGERY     bone removed from both sides of hip   KNEE ARTHROTOMY Right 12/04/2014   Procedure: KNEE ARTHROTOMY PATELLA LIGAMENT RECONSTRUSION AND REPAIR RIGHT KNEE;  Surgeon: Paralee Cancel, MD;  Location: [place];  Service: Orthopedics;  Laterality: Right;   KNEE SURGERY     X 25 TIMES   LEFT HEART CATH AND CORONARY ANGIOGRAPHY N/A 01/17/2018   Procedure: LEFT HEART CATH AND CORONARY ANGIOGRAPHY;  Surgeon: Belva Crome, MD;  Location: [place] CV LAB;  Service: Cardiovascular;  Laterality: N/A;   LUMBAR DISC SURGERY     Left L3, L4, L5 discecotomy with decompression of L4 root   TONSILLECTOMY     TOTAL KNEE ARTHROPLASTY  2003   LEFT   TOTAL KNEE ARTHROPLASTY Right 03/23/2014   Procedure: RIGHT TOTAL KNEE ARTHROPLASTY AND REMOVAL RIGHT TIBIAL  DEEP IMPLANT STAPLE;  Surgeon: Paralee Cancel, MD;  Location: [facility];  Service: Orthopedics;  Laterality: Right;   TOTAL KNEE REVISION  2005   LEFT   WRIST SURGERY      Current Outpatient Medications  Medication Sig Dispense Refill   acetaminophen (TYLENOL) 325 MG tablet Take 2 tablets (650 mg total) by mouth every 6 (six) hours as needed for mild pain (or Fever >/= 101). 30 tablet 1   albuterol (PROVENTIL) (2.5 MG/3ML) 0.083% nebulizer solution Take 3 mLs (2.5 mg total) by nebulization every 6 (six) hours as needed for up to 7 days for wheezing or shortness of breath. 84 mL 0   albuterol (VENTOLIN HFA) 108 (90 Base) MCG/ACT inhaler Inhale 2 puffs into the lungs every 6 (six) hours as needed for wheezing or shortness of breath. 18 g 5   apixaban (ELIQUIS) 5 MG TABS tablet Take 1 tablet (5 mg total) by mouth 2 (two) times daily. For stroke prevention 60 tablet 5   Continuous Blood Gluc Receiver (DEXCOM G6 RECEIVER) DEVI 1 Piece by Does not apply route as needed. 1 each 0   Continuous Blood Gluc Sensor (DEXCOM G6 SENSOR) MISC 4 Pieces by Does not apply route once a week. 4 each 2    Fluticasone-Umeclidin-Vilant (TRELEGY ELLIPTA) 100-62.5-25 MCG/INH AEPB Inhale 1 puff into the lungs daily. 28 each 0   furosemide (LASIX) 40 MG tablet Take by mouth. Take 40mg  daily alternating with 80mg  every other day.     Insulin Pen Needle (B-D ULTRAFINE III SHORT PEN) 31G X 8 MM MISC USE 1 PEN NEEDLE THREE TIMES DAILY 100 each 0   insulin regular human CONCENTRATED (HUMULIN R U-500 KWIKPEN) 500 UNIT/ML KwikPen Inject 75-85 Units into the skin 3 (three) times daily with meals. Inject 85 units with breakfast and lunch, and 75 units with supper if glucose is above 90 and you are eating 60 mL 2   levothyroxine (SYNTHROID) 137 MCG tablet TAKE 1 TABLET BY MOUTH ONCE DAILY BEFORE BREAKFAST 90 tablet 0   loratadine (CLARITIN) 10 MG tablet Take 10 mg by mouth daily as needed for allergies.      losartan (COZAAR) 100 MG tablet Take 100 mg by mouth daily.     metFORMIN (GLUCOPHAGE) 500 MG tablet TAKE 1 TABLET BY MOUTH TWICE DAILY WITH MEALS 60 tablet 0   metoprolol succinate (TOPROL XL) 25 MG 24 hr tablet Take 1 tablet (25 mg total) by mouth See admin instructions. -Take 1 tablet (25 mg) along with an additional 50 mg tablet for total of 75 mg twice daily 180 tablet 2   metoprolol succinate (TOPROL-XL) 50 MG 24 hr tablet Take 1 tablet (50 mg total) by mouth 2 (two) times daily. -Take 1 tablet (50 mg) along with an additional 25 mg tablet for total of 75 mg twice daily 180 tablet 3   modafinil (PROVIGIL) 200 MG tablet Take 1 tablet (200 mg total) by mouth daily. 90 tablet 1   MYRBETRIQ 50 MG TB24 tablet Take 50 mg by mouth daily.     nitroGLYCERIN (NITROSTAT) 0.4 MG SL tablet DISSOLVE 1 TABLET UNDER TONGUE EVERY 5 MINUTES UP TO 15 MIN FOR CHESTPAIN. IF NO RELIEF CALL 911. 25 tablet 3   ONE TOUCH ULTRA TEST test strip TEST BLOOD SUGAR UP TO 4 TIMES DAILY. 150 each 5   ONETOUCH DELICA LANCETS 02D MISC USE AS DIRECTED TO TEST BLOOD SUGAR 4 TIMES DAILY. 150 each 5   pravastatin (PRAVACHOL) 40 MG tablet TAKE 1  TABLET BY MOUTH ONCE DAILY IN THE EVENING 90 tablet 2   rOPINIRole (REQUIP XL) 4 MG 24 hr tablet Take 1 tablet (4 mg total) by mouth at bedtime. 90 tablet 1   rOPINIRole (REQUIP)  4 MG tablet Take 0.5 tablets (2 mg total) by mouth 2 (two) times daily. 90 tablet 1   Semaglutide,0.25 or 0.5MG /DOS, (OZEMPIC, 0.25 OR 0.5 MG/DOSE,) 2 MG/1.5ML SOPN Inject 0.5 mg into the skin once a week. 3 mL 3   No current facility-administered medications for this visit.   Allergies:  Tape, Cardizem [diltiazem hcl], Cardura [doxazosin mesylate], Lipitor [atorvastatin], Paxil [paroxetine hcl], Codeine, and Gabapentin   ROS: Intermittent cough and head congestion.  Physical Exam: VS:  BP 140/70    Pulse 75    Ht 5\' 5"  (1.651 m)    Wt 217 lb 3.2 oz (98.5 kg)    SpO2 96%    BMI 36.14 kg/m , BMI Body mass index is 36.14 kg/m.  Wt Readings from Last 3 Encounters:  12/20/20 217 lb 3.2 oz (98.5 kg)  12/14/20 225 lb 6.4 oz (102.2 kg)  12/08/20 220 lb (99.8 kg)    General: Patient appears comfortable at rest. HEENT: Conjunctiva and lids normal, wearing a mask. Neck: Supple, no elevated JVP or carotid bruits, no thyromegaly. Lungs: Clear to auscultation, nonlabored breathing at rest. Cardiac: Regular rate and rhythm, no S3 or significant systolic murmur, no pericardial rub. Extremities: No pitting edema.  ECG:  An ECG dated 12/18/2020 was personally reviewed today and demonstrated:  Sinus rhythm with old inferior infarct pattern.  Recent Labwork: 03/10/2020: TSH 1.310 05/21/2020: Pro B Natriuretic peptide (BNP) 100.0 05/23/2020: ALT 23; AST 23; B Natriuretic Peptide 56.0 12/18/2020: BUN 32; Creatinine, Ser 1.13; Hemoglobin 12.8; Platelets 206; Potassium 4.1; Sodium 136     Component Value Date/Time   CHOL 177 08/05/2019 0334   CHOL 148 08/19/2015 0914   TRIG 95 08/05/2019 0334   HDL 56 08/05/2019 0334   HDL 53 08/19/2015 0914   CHOLHDL 3.2 08/05/2019 0334   VLDL 19 08/05/2019 0334   LDLCALC 102 (H)  08/05/2019 0334   LDLCALC 118 (H) 02/06/2019 0813    Other Studies Reviewed Today:  Echocardiogram 08/05/2019:  1. Left ventricular ejection fraction, by estimation, is 55 to 60%. The  left ventricle has normal function. The left ventricle has no regional  wall motion abnormalities. Left ventricular diastolic parameters were  normal.   2. Right ventricular systolic function is normal. The right ventricular  size is normal. Tricuspid regurgitation signal is inadequate for assessing  PA pressure.   3. Left atrial size was upper normal.   4. The mitral valve is grossly normal. Mild mitral valve regurgitation.   5. The aortic valve is tricuspid. Aortic valve regurgitation is not  visualized.   6. The inferior vena cava is normal in size with greater than 50%  respiratory variability, suggesting right atrial pressure of 3 mmHg.   Assessment and Plan:  1.  Paroxysmal atrial fibrillation with CHA2DS2-VASc score of 5.  He is doing well without progressive palpitations and continues on Toprol-XL along with Eliquis.  Recent lab work and ECG reviewed.  2.  CAD with medically managed proximal to mid nondominant RCA stenosis.  No progressive angina symptoms.  Continue Toprol-XL, losartan, Pravachol, and as needed nitroglycerin.  Medication Adjustments/Labs and Tests Ordered: Current medicines are reviewed at length with the patient today.  Concerns regarding medicines are outlined above.   Tests Ordered: No orders of the defined types were placed in this encounter.   Medication Changes: No orders of the defined types were placed in this encounter.   Disposition:  Follow up  6 months.  Signed, Satira Sark, MD, [facility] 12/20/2020  11:48 AM    [facility] [place], [place], [place] [address] Phone: [phone]; Fax: [phone]

## 2020-12-21 ENCOUNTER — Telehealth: Payer: Self-pay | Admitting: Nurse Practitioner

## 2020-12-21 NOTE — Telephone Encounter (Signed)
Called patient and gave him the message from [place]. Patient verbalized an understanding and will let us know if he has any concerns.

## 2020-12-21 NOTE — Telephone Encounter (Signed)
His upper respiratory infection is likely contributing to his higher than normal readings.  He can temporarily increase his insulin (U500) to 90 units with breakfast and lunch and 80 units with supper if glucose is above 90 and he is eating.

## 2020-12-21 NOTE — Telephone Encounter (Signed)
12/17 179, 226, 150, 178  12/18 259, 300, 250  12/19 560, 472, 459, 179  12/20 223  Pt states the only thing he has done differently is take albuterol treatment. Can be reached at [phone]

## 2020-12-21 NOTE — Telephone Encounter (Signed)
Pt left a VM for high readings, called patient, he answered then phone cut off. If he calls back, please get his readings   Date Before breakfast Before lunch Before supper Bedtime  12/1      12/18      12/19      12/20

## 2020-12-29 DIAGNOSIS — E1110 Type 2 diabetes mellitus with ketoacidosis without coma: Secondary | ICD-10-CM | POA: Diagnosis not present

## 2020-12-30 DIAGNOSIS — E119 Type 2 diabetes mellitus without complications: Secondary | ICD-10-CM | POA: Diagnosis not present

## 2020-12-30 DIAGNOSIS — E7800 Pure hypercholesterolemia, unspecified: Secondary | ICD-10-CM | POA: Diagnosis not present

## 2020-12-30 DIAGNOSIS — Z0100 Encounter for examination of eyes and vision without abnormal findings: Secondary | ICD-10-CM | POA: Diagnosis not present

## 2020-12-30 DIAGNOSIS — I10 Essential (primary) hypertension: Secondary | ICD-10-CM | POA: Diagnosis not present

## 2020-12-30 DIAGNOSIS — H524 Presbyopia: Secondary | ICD-10-CM | POA: Diagnosis not present

## 2020-12-30 LAB — HM DIABETES EYE EXAM

## 2021-01-02 DIAGNOSIS — G4733 Obstructive sleep apnea (adult) (pediatric): Secondary | ICD-10-CM | POA: Diagnosis not present

## 2021-01-11 ENCOUNTER — Other Ambulatory Visit: Payer: Self-pay | Admitting: Nurse Practitioner

## 2021-01-11 DIAGNOSIS — E1159 Type 2 diabetes mellitus with other circulatory complications: Secondary | ICD-10-CM

## 2021-01-13 ENCOUNTER — Telehealth: Payer: Self-pay | Admitting: Family Medicine

## 2021-01-13 NOTE — Telephone Encounter (Signed)
°  Left message for patient to call back and schedule Medicare Annual Wellness Visit (AWV) in office.  ° °If unable to come into the office for AWV,  please offer to do virtually or by telephone. ° °No hx of AWV eligible for AWVI as of 01/02/2009 per [employer]  ° °Please schedule at anytime with [facility] Health Advisor.     ° °40 Minutes appointment  ° °Any questions, please call me at [phone]   °

## 2021-01-18 ENCOUNTER — Ambulatory Visit (INDEPENDENT_AMBULATORY_CARE_PROVIDER_SITE_OTHER): Payer: Medicare HMO

## 2021-01-18 ENCOUNTER — Other Ambulatory Visit: Payer: Self-pay

## 2021-01-18 VITALS — BP 142/84 | HR 74 | Ht 65.0 in | Wt 218.4 lb

## 2021-01-18 DIAGNOSIS — I10 Essential (primary) hypertension: Secondary | ICD-10-CM | POA: Diagnosis not present

## 2021-01-18 DIAGNOSIS — E11649 Type 2 diabetes mellitus with hypoglycemia without coma: Secondary | ICD-10-CM | POA: Diagnosis not present

## 2021-01-18 DIAGNOSIS — R3912 Poor urinary stream: Secondary | ICD-10-CM | POA: Diagnosis not present

## 2021-01-18 DIAGNOSIS — Z794 Long term (current) use of insulin: Secondary | ICD-10-CM | POA: Diagnosis not present

## 2021-01-18 DIAGNOSIS — Z0000 Encounter for general adult medical examination without abnormal findings: Secondary | ICD-10-CM | POA: Diagnosis not present

## 2021-01-18 DIAGNOSIS — R3915 Urgency of urination: Secondary | ICD-10-CM

## 2021-01-18 DIAGNOSIS — N400 Enlarged prostate without lower urinary tract symptoms: Secondary | ICD-10-CM | POA: Diagnosis not present

## 2021-01-18 DIAGNOSIS — Z79899 Other long term (current) drug therapy: Secondary | ICD-10-CM | POA: Insufficient documentation

## 2021-01-18 DIAGNOSIS — Z1211 Encounter for screening for malignant neoplasm of colon: Secondary | ICD-10-CM | POA: Diagnosis not present

## 2021-01-18 NOTE — Patient Instructions (Signed)
Paul Baker , Thank you for taking time to come for your Medicare Wellness Visit. I appreciate your ongoing commitment to your health goals. Please review the following plan we discussed and let me know if I can assist you in the future.   Screening recommendations/referrals: Colonoscopy: Order placed today.  Recommended yearly ophthalmology/optometry visit for glaucoma screening and checkup Recommended yearly dental visit for hygiene and checkup  Vaccinations: Influenza vaccine: 11/02/2020 Repeat annually  Pneumococcal vaccine: 10/03/2011 and 10/03/2014 Tdap vaccine: Done 05/24/2006 Repeat in 10 years  Shingles vaccine: Shingrix discussed. Please contact your pharmacy for coverage information.     Covid-19: Done 03/06/2019 and 03/27/2019  Advanced directives: Please bring a copy of your health care power of attorney and living will to the office to be added to your chart at your convenience.   Conditions/risks identified: KEEP UP THE WORK!!  Next appointment: Follow up in one year for your annual wellness visit. 2024.  Preventive Care 32 Years and Older, Male  Preventive care refers to lifestyle choices and visits with your health care provider that can promote health and wellness. What does preventive care include? A yearly physical exam. This is also called an annual well check. Dental exams once or twice a year. Routine eye exams. Ask your health care provider how often you should have your eyes checked. Personal lifestyle choices, including: Daily care of your teeth and gums. Regular physical activity. Eating a healthy diet. Avoiding tobacco and drug use. Limiting alcohol use. Practicing safe sex. Taking low doses of aspirin every day. Taking vitamin and mineral supplements as recommended by your health care provider. What happens during an annual well check? The services and screenings done by your health care provider during your annual well check will depend on your age,  overall health, lifestyle risk factors, and family history of disease. Counseling  Your health care provider may ask you questions about your: Alcohol use. Tobacco use. Drug use. Emotional well-being. Home and relationship well-being. Sexual activity. Eating habits. History of falls. Memory and ability to understand (cognition). Work and work Statistician. Screening  You may have the following tests or measurements: Height, weight, and BMI. Blood pressure. Lipid and cholesterol levels. These may be checked every 5 years, or more frequently if you are over 85 years old. Skin check. Lung cancer screening. You may have this screening every year starting at age 70 if you have a 30-pack-year history of smoking and currently smoke or have quit within the past 15 years. Fecal occult blood test (FOBT) of the stool. You may have this test every year starting at age 47. Flexible sigmoidoscopy or colonoscopy. You may have a sigmoidoscopy every 5 years or a colonoscopy every 10 years starting at age 4. Prostate cancer screening. Recommendations will vary depending on your family history and other risks. Hepatitis C blood test. Hepatitis B blood test. Sexually transmitted disease (STD) testing. Diabetes screening. This is done by checking your blood sugar (glucose) after you have not eaten for a while (fasting). You may have this done every 1-3 years. Abdominal aortic aneurysm (AAA) screening. You may need this if you are a current or former smoker. Osteoporosis. You may be screened starting at age 44 if you are at high risk. Talk with your health care provider about your test results, treatment options, and if necessary, the need for more tests. Vaccines  Your health care provider may recommend certain vaccines, such as: Influenza vaccine. This is recommended every year. Tetanus, diphtheria, and acellular  pertussis (Tdap, Td) vaccine. You may need a Td booster every 10 years. Zoster vaccine.  You may need this after age 46. Pneumococcal 13-valent conjugate (PCV13) vaccine. One dose is recommended after age 54. Pneumococcal polysaccharide (PPSV23) vaccine. One dose is recommended after age 53. Talk to your health care provider about which screenings and vaccines you need and how often you need them. This information is not intended to replace advice given to you by your health care provider. Make sure you discuss any questions you have with your health care provider. Document Released: 01/15/2015 Document Revised: 09/08/2015 Document Reviewed: 10/20/2014 Elsevier Interactive Patient Education  2017 [place] Prevention in the Home Falls can cause injuries. They can happen to people of all ages. There are many things you can do to make your home safe and to help prevent falls. What can I do on the outside of my home? Regularly fix the edges of walkways and driveways and fix any cracks. Remove anything that might make you trip as you walk through a door, such as a raised step or threshold. Trim any bushes or trees on the path to your home. Use bright outdoor lighting. Clear any walking paths of anything that might make someone trip, such as rocks or tools. Regularly check to see if handrails are loose or broken. Make sure that both sides of any steps have handrails. Any raised decks and porches should have guardrails on the edges. Have any leaves, snow, or ice cleared regularly. Use sand or salt on walking paths during winter. Clean up any spills in your garage right away. This includes oil or grease spills. What can I do in the bathroom? Use night lights. Install grab bars by the toilet and in the tub and shower. Do not use towel bars as grab bars. Use non-skid mats or decals in the tub or shower. If you need to sit down in the shower, use a plastic, non-slip stool. Keep the floor dry. Clean up any water that spills on the floor as soon as it happens. Remove soap  buildup in the tub or shower regularly. Attach bath mats securely with double-sided non-slip rug tape. Do not have throw rugs and other things on the floor that can make you trip. What can I do in the bedroom? Use night lights. Make sure that you have a light by your bed that is easy to reach. Do not use any sheets or blankets that are too big for your bed. They should not hang down onto the floor. Have a firm chair that has side arms. You can use this for support while you get dressed. Do not have throw rugs and other things on the floor that can make you trip. What can I do in the kitchen? Clean up any spills right away. Avoid walking on wet floors. Keep items that you use a lot in easy-to-reach places. If you need to reach something above you, use a strong step stool that has a grab bar. Keep electrical cords out of the way. Do not use floor polish or wax that makes floors slippery. If you must use wax, use non-skid floor wax. Do not have throw rugs and other things on the floor that can make you trip. What can I do with my stairs? Do not leave any items on the stairs. Make sure that there are handrails on both sides of the stairs and use them. Fix handrails that are broken or loose. Make sure that handrails  are as long as the stairways. Check any carpeting to make sure that it is firmly attached to the stairs. Fix any carpet that is loose or worn. Avoid having throw rugs at the top or bottom of the stairs. If you do have throw rugs, attach them to the floor with carpet tape. Make sure that you have a light switch at the top of the stairs and the bottom of the stairs. If you do not have them, ask someone to add them for you. What else can I do to help prevent falls? Wear shoes that: Do not have high heels. Have rubber bottoms. Are comfortable and fit you well. Are closed at the toe. Do not wear sandals. If you use a stepladder: Make sure that it is fully opened. Do not climb a closed  stepladder. Make sure that both sides of the stepladder are locked into place. Ask someone to hold it for you, if possible. Clearly mark and make sure that you can see: Any grab bars or handrails. First and last steps. Where the edge of each step is. Use tools that help you move around (mobility aids) if they are needed. These include: Canes. Walkers. Scooters. Crutches. Turn on the lights when you go into a dark area. Replace any light bulbs as soon as they burn out. Set up your furniture so you have a clear path. Avoid moving your furniture around. If any of your floors are uneven, fix them. If there are any pets around you, be aware of where they are. Review your medicines with your doctor. Some medicines can make you feel dizzy. This can increase your chance of falling. Ask your doctor what other things that you can do to help prevent falls. This information is not intended to replace advice given to you by your health care provider. Make sure you discuss any questions you have with your health care provider. Document Released: 10/15/2008 Document Revised: 05/27/2015 Document Reviewed: 01/23/2014 Elsevier Interactive Patient Education  2017 [employer].

## 2021-01-18 NOTE — Progress Notes (Addendum)
I have reviewed and agree with the above visit documentation Sallee Lange MD [facility]  Subjective:   Paul Baker is a 71 y.o. male who presents for an Initial Medicare Annual Wellness Visit. Virtual Visit via Telephone Note  I connected with  Paul Baker on 01/18/21 at 10:20 AM EST by telephone and verified that I am speaking with the correct person using two identifiers.  Review of Systems     Cardiac Risk Factors include: advanced age (>4men, >52 women);male gender;sedentary lifestyle;obesity (BMI >30kg/m2);diabetes mellitus;dyslipidemia  IN OFFICE VISIT AT [facility].    Objective:    Today's Vitals   01/18/21 1023 01/18/21 1031  BP: (!) 142/84   Pulse: 74   SpO2: 100%   Weight: 218 lb 6.4 oz (99.1 kg)   Height: 5\' 5"  (1.651 m)   PainSc:  3    Body mass index is 36.34 kg/m.  Advanced Directives 01/18/2021 12/18/2020 02/18/2020 12/05/2019 12/05/2019 08/04/2019 04/27/2019  Does Patient Have a Medical Advance Directive? Yes Yes No Yes No Yes No  Type of Paramedic of [place];Living will Living will - [place];Living will -  Does patient want to make changes to medical advance directive? - - - - - - -  Copy of [place] in Chart? No - copy requested - - No - copy requested - - -  Would patient like information on creating a medical advance directive? - No - Patient declined - - - - No - Patient declined    Current Medications (verified) Outpatient Encounter Medications as of 01/18/2021  Medication Sig   acetaminophen (TYLENOL) 325 MG tablet Take 2 tablets (650 mg total) by mouth every 6 (six) hours as needed for mild pain (or Fever >/= 101).   albuterol (VENTOLIN HFA) 108 (90 Base) MCG/ACT inhaler Inhale 2 puffs into the lungs every 6 (six) hours as needed for wheezing or shortness of breath.   apixaban (ELIQUIS) 5 MG TABS tablet Take 1 tablet (5 mg total) by  mouth 2 (two) times daily. For stroke prevention   Continuous Blood Gluc Receiver (DEXCOM G6 RECEIVER) DEVI 1 Piece by Does not apply route as needed.   Continuous Blood Gluc Sensor (DEXCOM G6 SENSOR) MISC 4 Pieces by Does not apply route once a week.   Fluticasone-Umeclidin-Vilant (TRELEGY ELLIPTA) 100-62.5-25 MCG/INH AEPB Inhale 1 puff into the lungs daily.   furosemide (LASIX) 40 MG tablet Take by mouth. Take 40mg  daily alternating with 80mg  every other day.   Insulin Pen Needle (B-D ULTRAFINE III SHORT PEN) 31G X 8 MM MISC USE 1 PEN NEEDLE THREE TIMES DAILY.   insulin regular human CONCENTRATED (HUMULIN R U-500 KWIKPEN) 500 UNIT/ML KwikPen Inject 75-85 Units into the skin 3 (three) times daily with meals. Inject 85 units with breakfast and lunch, and 75 units with supper if glucose is above 90 and you are eating   levothyroxine (SYNTHROID) 137 MCG tablet TAKE 1 TABLET BY MOUTH ONCE DAILY BEFORE BREAKFAST   loratadine (CLARITIN) 10 MG tablet Take 10 mg by mouth daily as needed for allergies.    losartan (COZAAR) 100 MG tablet Take 100 mg by mouth daily.   metFORMIN (GLUCOPHAGE) 500 MG tablet TAKE 1 TABLET BY MOUTH TWICE DAILY WITH MEALS   metoprolol succinate (TOPROL XL) 25 MG 24 hr tablet Take 1 tablet (25 mg total) by mouth See admin instructions. -Take 1 tablet (25 mg) along with an additional 50  mg tablet for total of 75 mg twice daily   metoprolol succinate (TOPROL-XL) 50 MG 24 hr tablet Take 1 tablet (50 mg total) by mouth 2 (two) times daily. -Take 1 tablet (50 mg) along with an additional 25 mg tablet for total of 75 mg twice daily   modafinil (PROVIGIL) 200 MG tablet Take 1 tablet (200 mg total) by mouth daily.   MYRBETRIQ 50 MG TB24 tablet Take 50 mg by mouth daily.   nitroGLYCERIN (NITROSTAT) 0.4 MG SL tablet DISSOLVE 1 TABLET UNDER TONGUE EVERY 5 MINUTES UP TO 15 MIN FOR CHESTPAIN. IF NO RELIEF CALL 911.   ONE TOUCH ULTRA TEST test strip TEST BLOOD SUGAR UP TO 4 TIMES DAILY.    ONETOUCH DELICA LANCETS 50D MISC USE AS DIRECTED TO TEST BLOOD SUGAR 4 TIMES DAILY.   pravastatin (PRAVACHOL) 40 MG tablet TAKE 1 TABLET BY MOUTH ONCE DAILY IN THE EVENING   rOPINIRole (REQUIP XL) 4 MG 24 hr tablet Take 1 tablet (4 mg total) by mouth at bedtime.   rOPINIRole (REQUIP) 4 MG tablet Take 0.5 tablets (2 mg total) by mouth 2 (two) times daily.   Semaglutide,0.25 or 0.5MG /DOS, (OZEMPIC, 0.25 OR 0.5 MG/DOSE,) 2 MG/1.5ML SOPN Inject 0.5 mg into the skin once a week.   albuterol (PROVENTIL) (2.5 MG/3ML) 0.083% nebulizer solution Take 3 mLs (2.5 mg total) by nebulization every 6 (six) hours as needed for up to 7 days for wheezing or shortness of breath.   [DISCONTINUED] alfuzosin (UROXATRAL) 10 MG 24 hr tablet Take 10 mg by mouth daily.   No facility-administered encounter medications on file as of 01/18/2021.    Allergies (verified) Tape, Cardizem [diltiazem hcl], Cardura [doxazosin mesylate], Lipitor [atorvastatin], Paxil [paroxetine hcl], Codeine, and Gabapentin   History: Past Medical History:  Diagnosis Date   Arthritis    Asthma    Atrial fibrillation [facility])    Diagnosed December 2021   Colon polyp    Coronary atherosclerosis of native coronary artery    a. 2011: cath showing 90% stenosis along small non-dominant RCA (too small for PCI). b. 01/2018: cath showing nonobstructive CAD with 60 to 70% proximal to mid nondominant RCA stenosis, 50% mid LAD and 40 to 50% OM1   DVT (deep venous thrombosis) ([place]) 2005   Right arm   Essential hypertension    Headache    History of transfusion    Hypothyroidism    Mixed hyperlipidemia    Morbid obesity ([place])    MRSA (methicillin resistant staph aureus) culture positive    08/2012   Narcolepsy    OSA (obstructive sleep apnea)    CPAP   Osteoarthritis    Pneumonia    Psoriasis    Pulmonary embolism ([place]) 2004   RLS (restless legs syndrome)    Rotator cuff disorder    Left   Septic arthritis of knee, left (HCC)    Skin cancer,  basal cell    Type 2 diabetes mellitus ([place])    Past Surgical History:  Procedure Laterality Date   BACK SURGERY     CATARACT EXTRACTION Bilateral    COLONOSCOPY     CYST REMOVAL TRUNK     from back   EYE SURGERY Left 2016   laser to left eye   HIP SURGERY     bone removed from both sides of hip   KNEE ARTHROTOMY Right 12/04/2014   Procedure: KNEE ARTHROTOMY PATELLA LIGAMENT RECONSTRUSION AND REPAIR RIGHT KNEE;  Surgeon: Paralee Cancel, MD;  Location: [facility];  Service: Orthopedics;  Laterality: Right;   KNEE SURGERY     X 25 TIMES   LEFT HEART CATH AND CORONARY ANGIOGRAPHY N/A 01/17/2018   Procedure: LEFT HEART CATH AND CORONARY ANGIOGRAPHY;  Surgeon: Belva Crome, MD;  Location: [facility];  Service: Cardiovascular;  Laterality: N/A;   LUMBAR DISC SURGERY     Left L3, L4, L5 discecotomy with decompression of L4 root   TONSILLECTOMY     TOTAL KNEE ARTHROPLASTY  2003   LEFT   TOTAL KNEE ARTHROPLASTY Right 03/23/2014   Procedure: RIGHT TOTAL KNEE ARTHROPLASTY AND REMOVAL RIGHT TIBIAL  DEEP IMPLANT STAPLE;  Surgeon: Paralee Cancel, MD;  Location: [facility];  Service: Orthopedics;  Laterality: Right;   TOTAL KNEE REVISION  2005   LEFT   WRIST SURGERY     Family History  Problem Relation Age of Onset   Hypertension Father    Heart attack Father    Kidney Stones Father    Seizures Grandchild    Narcolepsy Grandchild    Diabetes Sister    Social History   Socioeconomic History   Marital status: Married    Spouse name: Information systems manager   Number of children: 2   Years of education: college   Highest education level: Not on file  Occupational History   Occupation: Disabled    Employer: UNEMPLOYED  Tobacco Use   Smoking status: Former    Packs/day: 1.50    Years: 10.00    Pack years: 15.00    Types: Cigarettes    Start date: 06/19/1967    Quit date: 01/03/1995    Years since quitting: 26.0   Smokeless tobacco: Never  Vaping Use   Vaping Use: Never used  Substance and Sexual  Activity   Alcohol use: No    Alcohol/week: 0.0 standard drinks    Comment: quit drinking in 07/86   Drug use: No   Sexual activity: Yes    Partners: Female  Other Topics Concern   Not on file  Social History Narrative    71 year old, right-handed, caucasian male with a past medical history of obesity, hypertension, hyperlipidemia, diabetes, obstructive sleep apnea, presenting with frequent nighttime awakenings, excessive daytime sleepiness, also transient confusional episodes.RLS and one beosity, OSA on CPAP with AHI of 3.2 and  setting of 16 cm water , [facility] .   Married since 1977.   Social Determinants of Health   Financial Resource Strain: Low Risk    Difficulty of Paying Living Expenses: Not hard at all  Food Insecurity: No Food Insecurity   Worried About Charity fundraiser in the Last Year: Never true   [place] in the Last Year: Never true  Transportation Needs: No Transportation Needs   Lack of Transportation (Medical): No   Lack of Transportation (Non-Medical): No  Physical Activity: Sufficiently Active   Days of Exercise per Week: 3 days   Minutes of Exercise per Session: 60 min  Stress: No Stress Concern Present   Feeling of Stress : Only a little  Social Connections: Engineer, building services of Communication with Friends and Family: More than three times a week   Frequency of Social Gatherings with Friends and Family: More than three times a week   Attends Religious Services: More than 4 times per year   Active Member of [employer] or Organizations: Yes   Attends Music therapist: More than 4 times per year   Marital Status: Married    Tobacco  Counseling Counseling given: Not Answered   Clinical Intake:  Pre-visit preparation completed: Yes  Pain : 0-10 Pain Score: 3  Pain Type: Chronic pain Pain Location: Back Pain Descriptors / Indicators: Aching Pain Onset: More than a month ago Pain Frequency: Intermittent     BMI  - recorded: 36.34 Nutritional Status: BMI > 30  Obese Nutritional Risks: None Diabetes: Yes  How often do you need to have someone help you when you read instructions, pamphlets, or other written materials from your doctor or pharmacy?: 1 - Never  Diabetic?Nutrition Risk Assessment:  Has the patient had any N/V/D within the last 2 months?  No  Does the patient have any non-healing wounds?  No  Has the patient had any unintentional weight loss or weight gain?  No   Diabetes:  Is the patient diabetic?  Yes  If diabetic, was a CBG obtained today?  No  Did the patient bring in their glucometer from home?  No  How often do you monitor your CBG's? Daily, has Dexcom.   Financial Strains and Diabetes Management:  Are you having any financial strains with the device, your supplies or your medication? Yes .  Does the patient want to be seen by Chronic Care Management for management of their diabetes?  Yes  Would the patient like to be referred to a Nutritionist or for Diabetic Management?  No   Diabetic Exams:  Diabetic Eye Exam: Completed 12/30/2020.  Pt has been advised about the importance in completing this exam.  Diabetic Foot Exam: Completed 12/11/2019. Pt has been advised about the importance in completing this exam. Interpreter Needed?: No  Information entered by :: MJ Taron Conrey,LPN   Activities of Daily Living In your present state of health, do you have any difficulty performing the following activities: 01/18/2021 01/17/2021  Hearing? N N  Vision? N N  Difficulty concentrating or making decisions? Y N  Comment Memory at times. -  Walking or climbing stairs? N N  Dressing or bathing? N N  Doing errands, shopping? N N  Preparing Food and eating ? N N  Using the Toilet? N N  In the past six months, have you accidently leaked urine? Y -  Comment See Urologist -  Do you have problems with loss of bowel control? N N  Managing your Medications? N N  Managing your Finances? N N   Housekeeping or managing your Housekeeping? N N  Some recent data might be hidden    Patient Care Team: Kathyrn Drown, MD as PCP - General (Family Medicine) Satira Sark, MD as PCP - Cardiology (Cardiology) Mcarthur Rossetti, MD (Orthopedic Surgery)  Indicate any recent Medical Services you may have received from other than [facility] providers in the past year (date may be approximate).     Assessment:   This is a routine wellness examination for Paul Baker.  Hearing/Vision screen Hearing Screening - Comments:: Some hearing issues. Vision Screening - Comments:: Glasses, 12/30/2020. [facility].  Dietary issues and exercise activities discussed: Current Exercise Habits: Structured exercise class (Pt states he goes to [employer] Type of exercise: walking;strength training/weights, Time (Minutes): 60, Frequency (Times/Week): 3, Weekly Exercise (Minutes/Week): 180, Intensity: Mild   Goals Addressed             This Visit's Progress    Exercise 3x per week (30 min per time)       Continue to keep up exercise. Travel.       Depression Screen [facility] 2/9  Scores 01/18/2021 11/18/2019 01/14/2019 12/05/2017 10/11/2017 07/10/2017 04/10/2017  PHQ - 2 Score 0 0 0 0 0 0 0  Exception Documentation - - Medical reason - - - -    Fall Risk Fall Risk  01/18/2021 01/17/2021 11/18/2019 05/20/2019 02/25/2019  Falls in the past year? 1 1 0 1 1  Number falls in past yr: 0 0 - 0 1  Injury with Fall? 0 0 - 0 0  Risk for fall due to : Impaired balance/gait;History of fall(s) - - - History of fall(s)  Follow up Falls prevention discussed - Falls evaluation completed - Falls evaluation completed;Education provided    FALL RISK PREVENTION PERTAINING TO THE HOME:  Any stairs in or around the home? Yes  If so, are there any without handrails? No  Home free of loose throw rugs in walkways, pet beds, electrical cords, etc? Yes  Adequate lighting in your home to reduce risk of falls? Yes    ASSISTIVE DEVICES UTILIZED TO PREVENT FALLS:  Life alert? No  Use of a cane, walker or w/c? No  Grab bars in the bathroom? No  Shower chair or bench in shower? No  Elevated toilet seat or a handicapped toilet? Yes   TIMED UP AND GO:  Was the test performed? Yes .  Length of time to ambulate 10 feet: 10 sec.   Gait steady and fast without use of assistive device  Cognitive Function:     6CIT Screen 01/18/2021  What Year? 0 points  What month? 0 points  What time? 0 points  Count back from 20 0 points  Months in reverse 0 points  Repeat phrase 2 points  Total Score 2    Immunizations Immunization History  Administered Date(s) Administered   Influenza Split 09/18/2012   Influenza,inj,Quad PF,6+ Mos 10/13/2013, 10/06/2014, 09/11/2018   Influenza-Unspecified 12/01/2015, 11/08/2016, 11/02/2020   PFIZER(Purple Top)SARS-COV-2 Vaccination 03/06/2019, 03/27/2019   Pneumococcal Polysaccharide-23 10/02/2004, 10/03/2011   Td 05/24/2006    TDAP status: Due, Education has been provided regarding the importance of this vaccine. Advised may receive this vaccine at local pharmacy or Health Dept. Aware to provide a copy of the vaccination record if obtained from local pharmacy or Health Dept. Verbalized acceptance and understanding.  Flu Vaccine status: Up to date  Pneumococcal vaccine status: Up to date  Covid-19 vaccine status: Completed vaccines  Qualifies for Shingles Vaccine? Yes   Zostavax completed No   Shingrix Completed?: No.    Education has been provided regarding the importance of this vaccine. Patient has been advised to call insurance company to determine out of pocket expense if they have not yet received this vaccine. Advised may also receive vaccine at local pharmacy or Health Dept. Verbalized acceptance and understanding.  Screening Tests Health Maintenance  Topic Date Due   Hepatitis C Screening  Never done   Zoster Vaccines- Shingrix (1 of 2) Never done    COLONOSCOPY (Pts 45-31yrs Insurance coverage will need to be confirmed)  Never done   Pneumonia Vaccine 39+ Years old (3 - PCV) 10/02/2012   TETANUS/TDAP  05/23/2016   COVID-19 Vaccine (3 - Pfizer risk series) 04/24/2019   FOOT EXAM  12/10/2020   HEMOGLOBIN A1C  05/12/2021   OPHTHALMOLOGY EXAM  12/30/2021   INFLUENZA VACCINE  Completed   HPV VACCINES  Aged Out    Health Maintenance  Health Maintenance Due  Topic Date Due   Hepatitis C Screening  Never done   Zoster Vaccines- Shingrix (1 of 2) Never done  COLONOSCOPY (Pts 45-60yrs Insurance coverage will need to be confirmed)  Never done   Pneumonia Vaccine 19+ Years old (3 - PCV) 10/02/2012   TETANUS/TDAP  05/23/2016   COVID-19 Vaccine (3 - Pfizer risk series) 04/24/2019   FOOT EXAM  12/10/2020    Colorectal cancer screening: Referral to GI placed 01/18/2021. Pt aware the office will call re: appt.  Lung Cancer Screening: (Low Dose CT Chest recommended if Age 13-80 years, 30 pack-year currently smoking OR have quit w/in 15years.) does qualify.    Additional Screening:  Hepatitis C Screening: does qualify; Completed Due  Vision Screening: Recommended annual ophthalmology exams for early detection of glaucoma and other disorders of the eye. Is the patient up to date with their annual eye exam?  Yes  Who is the provider or what is the name of the office in which the patient attends annual eye exams? [facility] If pt is not established with a provider, would they like to be referred to a provider to establish care? No .   Dental Screening: Recommended annual dental exams for proper oral hygiene  Community Resource Referral / Chronic Care Management: CRR required this visit?  No   CCM required this visit?  Yes      Plan:     I have personally reviewed and noted the following in the patients chart:   Medical and social history Use of alcohol, tobacco or illicit drugs  Current medications and supplements including  opioid prescriptions. Patient is not currently taking opioid prescriptions. Functional ability and status Nutritional status Physical activity Advanced directives List of other physicians Hospitalizations, surgeries, and ER visits in previous 12 months Vitals Screenings to include cognitive, depression, and falls Referrals and appointments  In addition, I have reviewed and discussed with patient certain preventive protocols, quality metrics, and best practice recommendations. A written personalized care plan for preventive services as well as general preventive health recommendations were provided to patient.     Chriss Driver, LPN   [id number]/[id number]   Nurse Notes: Pt would like order placed to have Colonoscopy repeated. Order placed at pt's request. Pt c/o issues with being able to afford his Ozempic and Myrbetriq. Offered CCM referral for assistance and pt is agreeable. Odrer placed.

## 2021-01-20 ENCOUNTER — Telehealth: Payer: Self-pay | Admitting: *Deleted

## 2021-01-20 NOTE — Chronic Care Management (AMB) (Signed)
°  Chronic Care Management   Outreach Note  01/20/2021 Name: Paul Baker MRN: [id number] DOB: 11/27/1950  Paul Baker is a 71 y.o. year old male who is a primary care patient of Luking, Elayne Snare, MD. I reached out to Paul Baker by phone today in response to a referral sent by Paul Baker's primary care provider.  An unsuccessful telephone outreach was attempted today. The patient was referred to the case management team for assistance with care management and care coordination.   Follow Up Plan: A HIPAA compliant phone message was left for the patient providing contact information and requesting a return call.  The care management team will reach out to the patient again over the next 7 days.  If patient returns call to provider office, please advise to call Paul Baker * at [phone].*  [facility]  Direct Dial: [phone]

## 2021-01-24 NOTE — Chronic Care Management (AMB) (Signed)
°  Care Management   Note  01/24/2021 Name: Paul Baker MRN: [id number] DOB: [birth year]  Paul Baker is a 71 y.o. year old male who is a primary care patient of Luking, Elayne Snare, MD and is actively engaged with the care management team. I reached out to [name] by phone today to assist with scheduling an initial visit with the Pharmacist  Follow up plan: Unsuccessful telephone outreach attempt made. A HIPAA compliant phone message was left for the patient providing contact information and requesting a return call.  The care management team will reach out to the patient again over the next 7 days.  If patient returns call to provider office, please advise to call [place] at [phone].  [facility]  Direct Dial: [phone]

## 2021-01-24 NOTE — Chronic Care Management (AMB) (Signed)
Chronic Care Management   Note  01/24/2021 Name: Paul Baker MRN: [id number] DOB: Aug 07, 1950  Paul Baker is a 71 y.o. year old male who is a primary care patient of Luking, Elayne Snare, MD. I reached out to [name] by phone today in response to a referral sent by Paul Baker PCP.  Paul Baker was given information about Chronic Care Management services today including:  CCM service includes personalized support from designated clinical staff supervised by his physician, including individualized plan of care and coordination with other care providers 24/7 contact phone numbers for assistance for urgent and routine care needs. Service will only be billed when office clinical staff spend 20 minutes or more in a month to coordinate care. Only one practitioner may furnish and bill the service in a calendar month. The patient may stop CCM services at any time (effective at the end of the month) by phone call to the office staff. The patient is responsible for co-pay (up to 20% after annual deductible is met) if co-pay is required by the individual health plan.   Patient agreed to services and verbal consent obtained.   Follow up plan: Face to Face appointment with care management team member scheduled for: 01/28/21  [place] Management  Direct Dial: [phone]

## 2021-01-28 ENCOUNTER — Other Ambulatory Visit: Payer: Self-pay

## 2021-01-28 ENCOUNTER — Ambulatory Visit (INDEPENDENT_AMBULATORY_CARE_PROVIDER_SITE_OTHER): Payer: Medicare HMO | Admitting: Pharmacist

## 2021-01-28 DIAGNOSIS — I25119 Atherosclerotic heart disease of native coronary artery with unspecified angina pectoris: Secondary | ICD-10-CM

## 2021-01-28 DIAGNOSIS — I10 Essential (primary) hypertension: Secondary | ICD-10-CM

## 2021-01-28 DIAGNOSIS — Z8673 Personal history of transient ischemic attack (TIA), and cerebral infarction without residual deficits: Secondary | ICD-10-CM

## 2021-01-28 DIAGNOSIS — E782 Mixed hyperlipidemia: Secondary | ICD-10-CM

## 2021-01-28 DIAGNOSIS — E11649 Type 2 diabetes mellitus with hypoglycemia without coma: Secondary | ICD-10-CM

## 2021-01-28 DIAGNOSIS — J4531 Mild persistent asthma with (acute) exacerbation: Secondary | ICD-10-CM

## 2021-01-28 DIAGNOSIS — Z794 Long term (current) use of insulin: Secondary | ICD-10-CM

## 2021-01-28 MED ORDER — GVOKE HYPOPEN 2-PACK 1 MG/0.2ML ~~LOC~~ SOAJ: SUBCUTANEOUS | 1 refills | Status: AC

## 2021-01-28 MED ORDER — EZETIMIBE 10 MG PO TABS: 10.0000 mg | ORAL_TABLET | Freq: Every day | ORAL | 3 refills | Status: DC

## 2021-01-28 NOTE — Chronic Care Management (AMB) (Signed)
Chronic Care Management Pharmacy Note  01/28/2021 Name:  Paul Baker MRN:  [id number] DOB:  15-Nov-1950  Summary: Type 2 Diabetes Managed by endocrinology Rayetta Pigg, NP) Uncontrolled; Most recent A1c above goal of <7% per ADA guidelines Current medications: metformin 500 mg by mouth twice daily, semaglutide (Ozempic) 0.5 mg subcutaneously weekly, and Humulin R (U-500) 85 units subcutaneously before breakfast, 85 units subcutaneously before lunch, and 75 units subcutaneously before dinner Current glucose readings: Dexcom G6 Clarity report reviewed today on patient's smartphone. Recent average blood glucose 230s with GMI ~9% which is improving. Blood glucose in target range 34% of the time, low <2% of the time, and high or very high 64% of the time. Fasting blood glucose not at goal 80-130. Breakfast appears to be the meal that causes the most significant increase in blood glucose. Patient does endorse occasional overnight hypoglycemia.  Continue current medications as above per endocrinology Add glucagon (Gvoke HypoPen) 1 mg mg subcutaneously as needed for hypoglycemia. May repeat dose in 15 minutes as needed. Will recommend that endocrinology consider increasing Ozempic to 1 mg weekly. Patient assistance program application given to patient today for him to have endocrinology help him complete.   Hyperlipidemia/Coronary artery disease/History of stoke Followed by cardiology (Dr. Domenic Polite) and neurology (Amy Lomax, NP) Uncontrolled. LDL above goal of <55 due to extreme risk given established clinical ASCVD + diabetes per 2023 ADA Standards of Care in Diabetes. Triglycerides at goal of <150 per 2020 AACE/ACE guidelines. Current medications: pravastatin 40 mg by mouth once daily Intolerances: legs cramps (atorvastatin and rosuvastatin) Continue pravastatin 40 mg by mouth once daily Discussed PCSK9i, Nexletol, and ezetimibe. At this time, based on shared clinical decision making,  will add ezetimibe 10 mg by mouth once daily Re-check lipid panel in 4-12 weeks. Will plan to order at next visit.   Overweight/Obesity Continue semaglutide (Ozempic) 0.5 mg subcutaneously weekly for now but will have endocrinology assess increasing dose to improve blood glucose control + additional weight loss benefit.   Financial: Patient reports cost concerns with several brand name medications. The following information was provided/discussed with the patient today. Call Myrbetriq [place] patient assistance program [phone]) Renew Eliquis patient assistance program when you have spent at least 3% (~$1,000) of your gross annual income at the pharmacy You are eligible for the Trelegy patient assistance program through [place] once you have spent at least $600 at the pharmacy in a calendar year Ozempic patient assistance program application printed today to be given to endocrinology to help you complete  Subjective: Paul Baker is an 71 y.o. year old male who is a primary patient of Luking, Elayne Snare, MD.  The CCM team was consulted for assistance with disease management and care coordination needs.    Engaged with patient face to face for initial visit in response to provider referral for pharmacy case management and/or care coordination services.   Consent to Services:  The patient was given the following information about Chronic Care Management services today, agreed to services, and gave verbal consent: 1. CCM service includes personalized support from designated clinical staff supervised by the primary care provider, including individualized plan of care and coordination with other care providers 2. 24/7 contact phone numbers for assistance for urgent and routine care needs. 3. Service will only be billed when office clinical staff spend 20 minutes or more in a month to coordinate care. 4. Only one practitioner may furnish and bill the service in a calendar month. 5.The patient  may  stop CCM services at any time (effective at the end of the month) by phone call to the office staff. 6. The patient will be responsible for cost sharing (co-pay) of up to 20% of the service fee (after annual deductible is met). Patient agreed to services and consent obtained.  Patient Care Team: Kathyrn Drown, MD as PCP - General (Family Medicine) Satira Sark, MD as PCP - Cardiology (Cardiology) Mcarthur Rossetti, MD (Orthopedic Surgery) Beryle Lathe, [facility] (Pharmacist)  Objective:  Lab Results  Component Value Date   CREATININE 1.13 12/18/2020   CREATININE 1.0 11/12/2020   CREATININE 1.43 (H) 05/23/2020    Lab Results  Component Value Date   HGBA1C 10.0 11/12/2020   Last diabetic Eye exam:  Lab Results  Component Value Date/Time   HMDIABEYEEXA No Retinopathy 12/30/2020 12:00 AM    Last diabetic Foot exam: No results found for: HMDIABFOOTEX      Component Value Date/Time   CHOL 177 08/05/2019 0334   CHOL 148 08/19/2015 0914   TRIG 95 08/05/2019 0334   HDL 56 08/05/2019 0334   HDL 53 08/19/2015 0914   CHOLHDL 3.2 08/05/2019 0334   VLDL 19 08/05/2019 0334   LDLCALC 102 (H) 08/05/2019 0334   LDLCALC 118 (H) 02/06/2019 0813    Hepatic Function Latest Ref Rng & Units 11/12/2020 05/23/2020 03/10/2020  Total Protein 6.5 - 8.1 g/dL - 6.9 6.6  Albumin 3.5 - 5.0 4.3 3.8 4.3  AST 15 - 41 U/L - 23 16  ALT 0 - 44 U/L - 23 14  Alk Phosphatase 38 - 126 U/L - 50 75  Total Bilirubin 0.3 - 1.2 mg/dL - 0.4 0.3  Bilirubin, Direct 0.00 - 0.40 mg/dL - - -    Lab Results  Component Value Date/Time   TSH 1.310 03/10/2020 08:05 AM   TSH 1.639 12/05/2019 03:06 AM   TSH 2.030 12/04/2019 08:32 AM   FREET4 1.57 03/10/2020 08:05 AM   FREET4 1.36 12/04/2019 08:32 AM    CBC Latest Ref Rng & Units 12/18/2020 11/12/2020 05/23/2020  WBC 4.0 - 10.5 K/uL 6.4 - 10.0  Hemoglobin 13.0 - 17.0 g/dL 12.8(L) 12.9(A) 11.8(L)  Hematocrit 39.0 - 52.0 % 39.6 - 36.4(L)  Platelets  150 - 400 K/uL 206 - 274    Lab Results  Component Value Date/Time   VD25OH 70 02/06/2019 08:13 AM    Clinical ASCVD: Yes  The 10-year ASCVD risk score (Arnett DK, et al., 2019) is: 35.9%   Values used to calculate the score:     Age: 83 years     Sex: Male     Is Non-Hispanic African American: No     Diabetic: Yes     Tobacco smoker: No     Systolic Blood Pressure: 078 mmHg     Is BP treated: Yes     HDL Cholesterol: 56 mg/dL     Total Cholesterol: 177 mg/dL    Social History   Tobacco Use  Smoking Status Former   Packs/day: 1.50   Years: 10.00   Pack years: 15.00   Types: Cigarettes   Start date: 06/19/1967   Quit date: 01/03/1995   Years since quitting: 26.0  Smokeless Tobacco Never   BP Readings from Last 3 Encounters:  01/18/21 (!) 142/84  12/20/20 140/70  12/18/20 138/79   Pulse Readings from Last 3 Encounters:  01/18/21 74  12/20/20 75  12/18/20 100   Wt Readings from Last 3 Encounters:  01/18/21  218 lb 6.4 oz (99.1 kg)  12/20/20 217 lb 3.2 oz (98.5 kg)  12/14/20 225 lb 6.4 oz (102.2 kg)    Assessment: Review of patient past medical history, allergies, medications, health status, including review of consultants reports, laboratory and other test data, was performed as part of comprehensive evaluation and provision of chronic care management services.   SDOH:  (Social Determinants of Health) assessments and interventions performed:  SDOH Interventions    Flowsheet Row Most Recent Value  SDOH Interventions   SDOH Interventions for the Following Domains Financial Strain  Financial Strain Interventions Other (Comment)  [Information given regarding medicaiton assistance pograms]       CCM Care Plan  Allergies  Allergen Reactions   Tape Rash    Pulls off skin   Cardizem [Diltiazem Hcl]     Edema    Cardura [Doxazosin Mesylate]     Headaches / cramps   Crestor [Rosuvastatin] Other (See Comments)    Leg cramps   Lipitor [Atorvastatin] Other (See  Comments)    Leg cramps   Paxil [Paroxetine Hcl]     Unknown reaction    Codeine Rash and Other (See Comments)    Headache    Gabapentin Rash    Medications Reviewed Today     Reviewed by Beryle Lathe, [facility] (Pharmacist) on 01/28/21 at 1511  Med List Status: <None>   Medication Order Taking? Sig Documenting Provider Last Dose Status Informant  acetaminophen (TYLENOL) 325 MG tablet [id number] Yes Take 2 tablets (650 mg total) by mouth every 6 (six) hours as needed for mild pain (or Fever >/= 101). Roxan Hockey, MD Taking Active   albuterol (PROVENTIL) (2.5 MG/3ML) 0.083% nebulizer solution [id number] No Take 3 mLs (2.5 mg total) by nebulization every 6 (six) hours as needed for up to 7 days for wheezing or shortness of breath.  Patient not taking: Reported on 01/28/2021   Marcello Fennel, PA-C Not Taking Active   albuterol (VENTOLIN HFA) 108 (90 Base) MCG/ACT inhaler [id number] Yes Inhale 2 puffs into the lungs every 6 (six) hours as needed for wheezing or shortness of breath. Parrett, Fonnie Mu, NP Taking Active   apixaban (ELIQUIS) 5 MG TABS tablet [id number] Yes Take 1 tablet (5 mg total) by mouth 2 (two) times daily. For stroke prevention Roxan Hockey, MD Taking Active   Continuous Blood Gluc Receiver ([place]) DEVI [id number] Yes 1 Piece by Does not apply route as needed. Brita Romp, NP Taking Active   Continuous Blood Gluc Sensor (DEXCOM G6 SENSOR) MISC [id number] Yes 4 Pieces by Does not apply route once a week. Cassandria Anger, MD Taking Active Self  Fluticasone-Umeclidin-Vilant (TRELEGY ELLIPTA) 100-62.5-25 MCG/INH AEPB [id number] Yes Inhale 1 puff into the lungs daily. Chesley Mires, MD Taking Active   furosemide (LASIX) 40 MG tablet [id number] Yes Take by mouth. Take 46m daily alternating with 862mevery other day. [provider] Taking Active   Insulin Pen Needle (B-D ULTRAFINE III SHORT PEN) 31G X 8 MM MISC [id number] USE 1 PEN NEEDLE  THREE TIMES DAILY. ReBrita RompNP Taking Active   insulin regular human CONCENTRATED (HUMULIN R U-500 KWIKPEN) 500 UNIT/ML KwikPen [id number] Inject 75-85 Units into the skin 3 (three) times daily with meals. Inject 85 units with breakfast and lunch, and 75 units with supper if glucose is above 90 and you are eating ReBrita RompNP Taking Active   levothyroxine (SYNTHROID) 137 MCG tablet [id number] TAKE 1 TABLET  BY MOUTH ONCE DAILY BEFORE BREAKFAST Nida, Marella Chimes, MD Taking Active   loratadine (CLARITIN) 10 MG tablet [id number] Yes Take 10 mg by mouth daily as needed for allergies.  [provider] Taking Active Self           Med Note [facility] MENDEZ, Adair Laundry   Tue Jul 01, 2019  4:07 PM)    losartan (COZAAR) 100 MG tablet [id number] Yes Take 100 mg by mouth daily. [provider] Taking Active   metFORMIN (GLUCOPHAGE) 500 MG tablet [id number] Yes TAKE 1 TABLET BY MOUTH TWICE DAILY WITH MEALS Reardon, Juanetta Beets, NP Taking Active   metoprolol succinate (TOPROL XL) 25 MG 24 hr tablet [id number] Yes Take 1 tablet (25 mg total) by mouth See admin instructions. -Take 1 tablet (25 mg) along with an additional 50 mg tablet for total of 75 mg twice daily Satira Sark, MD Taking Active   metoprolol succinate (TOPROL-XL) 50 MG 24 hr tablet [id number] Yes Take 1 tablet (50 mg total) by mouth 2 (two) times daily. -Take 1 tablet (50 mg) along with an additional 25 mg tablet for total of 75 mg twice daily Emokpae, Courage, MD Taking Active   modafinil (PROVIGIL) 200 MG tablet [id number] Yes Take 1 tablet (200 mg total) by mouth daily. Lomax, Amy, NP Taking Active   MYRBETRIQ 50 MG TB24 tablet [id number] Yes Take 50 mg by mouth daily. [provider] Taking Active   nitroGLYCERIN (NITROSTAT) 0.4 MG SL tablet [id number] No DISSOLVE 1 TABLET UNDER TONGUE EVERY 5 MINUTES UP TO 15 MIN FOR CHESTPAIN. IF NO RELIEF CALL 911.  Patient not taking: Reported on 01/28/2021    Satira Sark, MD Not Taking Active   ONE TOUCH ULTRA TEST test strip [id number] Yes TEST BLOOD SUGAR UP TO 4 TIMES DAILY. Cassandria Anger, MD Taking Active Self  [facility] LANCETS [address] [place] [id number] Yes USE AS DIRECTED TO TEST BLOOD SUGAR 4 TIMES DAILY. Cassandria Anger, MD Taking Active Self  pravastatin (PRAVACHOL) 40 MG tablet [id number] Yes TAKE 1 TABLET BY MOUTH ONCE DAILY IN THE Ophelia Shoulder, MD Taking Active   rOPINIRole (REQUIP XL) 4 MG 24 hr tablet [id number] Yes Take 1 tablet (4 mg total) by mouth at bedtime. Lomax, Amy, NP Taking Active   rOPINIRole (REQUIP) 4 MG tablet [id number] Yes Take 0.5 tablets (2 mg total) by mouth 2 (two) times daily. Lomax, Amy, NP Taking Active   Semaglutide,0.25 or 0.5MG/DOS, (OZEMPIC, 0.25 OR 0.5 MG/DOSE,) 2 MG/1.5ML SOPN [id number] Yes Inject 0.5 mg into the skin once a week. Brita Romp, NP Taking Active            Med Note Waldo Laine, Gwenyth Allegra   Fri Jan 28, 2021  3:11 PM) Every Wednesday            Patient Active Problem List   Diagnosis Date Noted   Other long term (current) drug therapy 01/18/2021   COVID-19 virus infection 05/21/2020   Acute coronary syndrome without high troponin ([place]) 12/05/2019   Grade I diastolic dysfunction 54/56/2563   Type 2 diabetes mellitus (HCC)    Paroxysmal atrial fibrillation (HCC)    Hypothyroidism    Acute bacterial rhinosinusitis 11/18/2019   Cough 11/18/2019   Paresthesia of skin 08/05/2019   Asthma 08/05/2019   Class 2 obesity 08/05/2019   Right sided weakness 08/05/2019   Vertigo 08/04/2019   Dizziness 02/18/2019   Type 2 diabetes mellitus with hypoglycemia without coma ([place])  03/31/2017   AKI (acute kidney injury) ([place]) 03/30/2017   Right patellar tendon rupture 12/04/2014   Mixed hyperlipidemia 11/25/2014   Other specified hypothyroidism 11/25/2014   S/P right TKA 03/23/2014   S/P knee replacement 03/23/2014   Spinal stenosis of cervicothoracic region  10/08/2013   RLS (restless legs syndrome) 10/08/2013   DM type 2 causing vascular disease ([place]) 10/08/2013   Preoperative cardiovascular examination 08/14/2013   Sleep apnea with use of continuous positive airway pressure (CPAP) 04/02/2013   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/02/2013   Diabetic neuropathy ([place]) 07/23/2012   Obesity, morbid ([place]) 05/01/2012   Hypersomnia, persistent 05/01/2012   OSA on CPAP 04/19/2012   Chest pain 04/19/2012   Essential hypertension, benign 12/16/2009   CORONARY ATHEROSCLEROSIS NATIVE CORONARY ARTERY 12/16/2009   Hyperglycemia due to type 2 diabetes mellitus ([place]) 11/30/2009   Accelerating angina ([place]) 11/30/2009    Immunization History  Administered Date(s) Administered   Influenza Split 09/18/2012   Influenza,inj,Quad PF,6+ Mos 10/13/2013, 10/06/2014, 09/11/2018   Influenza-Unspecified 12/01/2015, 11/08/2016, 11/02/2020   PFIZER(Purple Top)SARS-COV-2 Vaccination 03/06/2019, 03/27/2019   Pneumococcal Polysaccharide-23 10/02/2004, 10/03/2011   Td 05/24/2006    Conditions to be addressed/monitored: Atrial Fibrillation, CAD, HTN, HLD, DMII, and obesity  Care Plan : Medication Management  Updates made by Beryle Lathe, [place] since 01/28/2021 12:00 AM     Problem: T2DM, HTN, HLD/CAD, Afib, Obesity, Asthma   Priority: High  Onset Date: 01/28/2021     Long-Range Goal: Disease Progression Prevention   Start Date: 01/28/2021  Expected End Date: 04/28/2021  This Visit's Progress: On track  Priority: High  Note:   Current Barriers:  Unable to independently afford treatment regimen Unable to achieve control of diabetes, hyperlipidemia, and weight management Suboptimal therapeutic regimen for diabetes and hyperlipidemia  Pharmacist Clinical Goal(s):  Through collaboration with PharmD and provider, patient will  Verbalize ability to afford treatment regimen Achieve control of diabetes, hyperlipidemia, and weight management as  evidenced by improved fasting blood sugar, improved A1c, improved LDL, and continued weight loss Adhere to plan to optimize therapeutic regimen for diabetes and hyperlipidemia as evidenced by report of adherence to recommended medication management changes   Interventions: 1:1 collaboration with Kathyrn Drown, MD regarding development and update of comprehensive plan of care as evidenced by provider attestation and co-signature Inter-disciplinary care team collaboration (see longitudinal plan of care) Comprehensive medication review performed; medication list updated in electronic medical record  Type 2 Diabetes - New goal.: Managed by endocrinology Rayetta Pigg, NP) Uncontrolled; Most recent A1c above goal of <7% per ADA guidelines Current medications: metformin 500 mg by mouth twice daily, semaglutide (Ozempic) 0.5 mg subcutaneously weekly, and Humulin R (U-500) 85 units subcutaneously before breakfast, 85 units subcutaneously before lunch, and 75 units subcutaneously before dinner Intolerances: none Taking medications as directed: yes Side effects thought to be attributed to current medication regimen:  some mild, tolerable nausea with Ozempic Patient reports he feels sweaty when he has hypoglycemic episodes. All other symptoms likely masked by beta blocker therapy.  Hypoglycemia prevention: none but will recommend Current meal patterns:  patient reports he usually eats eggs, bacon, and oatmeal for breakfast with black coffee. For lunch and dinner he eats soups and sandwiches. He rarely drinks soda but when he does it is diet. Does not normally drink sweet tea, juice, or milk.  Current exercise: not discussed today On a statin: yes On aspirin 81 mg daily: no, on blood thinner (Eliquis) Last microalbumin/creatinine ratio: 89 (02/06/19);  on an ACEi/ARB: yes Last eye exam: completed within last year Last foot exam: overdue Pneumonia vaccine:  overdue Influenza vaccine: up to  date Shingrix vaccine: overdue Current glucose readings:  Dexcom G6 Clarity report reviewed today on patient's smartphone. Recent average blood glucose 230s with GMI ~9% which is improving. Blood glucose in target range 34% of the time, low <2% of the time, and high or very high 64% of the time. Fasting blood glucose not at goal 80-130. Breakfast appears to be the meal that causes the most significant increase in blood glucose. Patient does endorse occasional overnight hypoglycemia.  Continue current medications as above per endocrinology Add glucagon (Gvoke HypoPen) 1 mg mg subcutaneously as needed for hypoglycemia. May repeat dose in 15 minutes as needed. Instructed to monitor blood sugars continuously with continuous glucose monitor  Discussed management of hypoglycemia. If blood sugar <70 at any time, treat with simple sugar such as 1/2 cup juice or regular soda or 3-4 glucose tablets. Recheck blood sugar in 15 minutes and repeat if blood sugar remains <70.  Continue follow-up with endocrinology Will recommend that endocrinology consider increasing Ozempic to 1 mg weekly. Patient assistance program application given to patient today for him to have endocrinology help him complete.   Hypertension - New goal.: Blood pressure under good control. Blood pressure is at goal of <140/90 mmHg per 2022 AAFP guidelines. Current medications: losartan 100 mg by mouth once daily, metoprolol succinate 75 mg by mouth twice daily, and furosemide 40-80 mg by mouth daily (alternating) Intolerances: none Taking medications as directed: yes Side effects thought to be attributed to current medication regimen: no Current home blood pressure: not discussed today Continue current medications as above Encourage dietary sodium restriction/DASH diet Recommend home blood pressure monitoring to discuss at next visit Discussed need for and importance of continued work on weight loss  Hyperlipidemia/Coronary artery  disease/History of stoke - New goal.: Followed by cardiology (Dr. Domenic Polite) and neurology (Amy Lomax, NP) Uncontrolled. LDL above goal of <55 due to extreme risk given established clinical ASCVD + diabetes per 2023 ADA Standards of Care in Diabetes. Triglycerides at goal of <150 per 2020 AACE/ACE guidelines. Current medications: pravastatin 40 mg by mouth once daily Intolerances:  legs cramps (atorvastatin and rosuvastatin) Taking medications as directed: yes Side effects thought to be attributed to current medication regimen: no Continue pravastatin 40 mg by mouth once daily Discussed PCSK9i, Nexletol, and ezetimibe. At this time, based on shared clinical decision making, will add ezetimibe 10 mg by mouth once daily Re-check lipid panel in 4-12 weeks. Will plan to order at next visit.  Encourage dietary reduction of high fat containing foods such as butter, nuts, bacon, egg yolks, etc. Discussed need for and importance of continued work on weight loss Reviewed risks of hyperlipidemia, principles of treatment and consequences of untreated hyperlipidemia Continue follow-up with cardiology/neurology  Overweight/Obesity - New goal.: Unable to achieve goal weight loss through lifestyle modification alone Current treatment: semaglutide (Ozempic) 0.5 mg subcutaneously weekly  Medications previously tried:  unknown Strategies previously tried: unknown History of bariatric surgery:  unknown Baseline weight: 240 lbs; most recent weight: 218 lbs Recommend that the patient emphasize lean proteins, fruits and vegetables, whole grains and increased fiber consumption, adequate hydration Continue semaglutide (Ozempic) 0.5 mg subcutaneously weekly for now but will have endocrinology assess increasing dose to improve blood glucose control + additional weight loss benefit.  Recommend diet modification to induce energy deficit of 500 kcal/day or greater  Asthma - New  goal.: Followed by Dr. Halford Chessman Controlled.   Current treatment: albuterol metered dose (ProAir, Ventolin, Proventil) 2 puffs by mouth every 6 hours as needed for shortness of breath or wheezing and fluticasone furoate/umeclidinium/vilanterol (Trelegy Ellipta) 1 inhalation once daily Last pulmonary function test: 09/20/20 Continue current medications as above Continue follow-up with pulmonology  Atrial Fibrillation - New goal.: Followed by Dr. Domenic Polite Controlled. Most recent ECG: normal sinus. Current rate control: metoprolol succinate 75 mg by mouth twice daily Patient was unable to tolerate diltiazem (edema) Anticoagulation: apixaban (Eliquis) 5 mg by mouth twice daily which is appropriate based on patient's age, renal function, and weight CHADS2VASc score: 6 - Age (1 point), Hypertension history (1 point), Stroke/TIA/Thromboembolism history (2 points), Vascular Disease history (prior MI, peripheral artery disease, or aortic plaque) (1 point), and Diabetes history (1 point) Reports intermittent palpitations Denies signs and symptoms of bleeding Continue current medications as above Continue follow-up with cardiology  Financial: Patient reports cost concerns with several brand name medications. The following information was provided/discussed with the patient today. Call Myrbetriq [place] patient assistance program [phone]) Renew Eliquis patient assistance program when you have spent at least 3% (~$1,000) of your gross annual income at the pharmacy You are eligible for the Trelegy patient assistance program through [place] once you have spent at least $600 at the pharmacy in a calendar year Ozempic patient assistance program application printed today to be given to endocrinology to help you complete  Patient Goals/Self-Care Activities Patient will:  Take medications as prescribed Check blood sugar continuously with continuous glucose monitor, document, and provide at future appointments Check blood pressure at least once  daily, document, and provide at future appointments Collaborate with provider on medication access solutions Engage in dietary modifications by fewer sweetened foods & beverages  Follow Up Plan: Face to Face appointment with care management team member scheduled for: 02/25/21       Medication Assistance:  see care plan  Patient's preferred pharmacy is:  [place], [place] - [place] [address] [place] [address] Phone: [phone] Fax: [phone]  [facility] [address] [place] [address] Phone: [phone] Fax: [place], [place] [address] [place] [address] Phone: [phone] Fax: [phone]  Follow Up:  Patient agrees to Care Plan and Follow-up.  Plan: Face to Face appointment with care management team member scheduled for: 02/25/21  Kennon Holter, PharmD, BCACP, CPP Clinical Pharmacist Practitioner [place] [phone]

## 2021-01-28 NOTE — Patient Instructions (Addendum)
Paul Baker,  It was great to talk to you today!  Please call me with any questions or concerns.  Visit Information   Following is a copy of your full care plan:  Care Plan : Medication Management  Updates made by Beryle Lathe, [place] since 01/28/2021 12:00 AM     Problem: T2DM, HTN, HLD/CAD, Afib, Obesity, Asthma   Priority: High  Onset Date: 01/28/2021     Long-Range Goal: Disease Progression Prevention   Start Date: 01/28/2021  Expected End Date: 04/28/2021  This Visit's Progress: On track  Priority: High  Note:   Current Barriers:  Unable to independently afford treatment regimen Unable to achieve control of diabetes, hyperlipidemia, and weight management Suboptimal therapeutic regimen for diabetes and hyperlipidemia  Pharmacist Clinical Goal(s):  Through collaboration with PharmD and provider, patient will  Verbalize ability to afford treatment regimen Achieve control of diabetes, hyperlipidemia, and weight management as evidenced by improved fasting blood sugar, improved A1c, improved LDL, and continued weight loss Adhere to plan to optimize therapeutic regimen for diabetes and hyperlipidemia as evidenced by report of adherence to recommended medication management changes   Interventions: 1:1 collaboration with Kathyrn Drown, MD regarding development and update of comprehensive plan of care as evidenced by provider attestation and co-signature Inter-disciplinary care team collaboration (see longitudinal plan of care) Comprehensive medication review performed; medication list updated in electronic medical record  Type 2 Diabetes - New goal.: Managed by endocrinology Rayetta Pigg, NP) Uncontrolled; Most recent A1c above goal of <7% per ADA guidelines Current medications: metformin 500 mg by mouth twice daily, semaglutide (Ozempic) 0.5 mg subcutaneously weekly, and Humulin R (U-500) 85 units subcutaneously before breakfast, 85 units subcutaneously before  lunch, and 75 units subcutaneously before dinner Intolerances: none Taking medications as directed: yes Side effects thought to be attributed to current medication regimen:  some mild, tolerable nausea with Ozempic Patient reports he feels sweaty when he has hypoglycemic episodes. All other symptoms likely masked by beta blocker therapy.  Hypoglycemia prevention: none but will recommend Current meal patterns:  patient reports he usually eats eggs, bacon, and oatmeal for breakfast with black coffee. For lunch and dinner he eats soups and sandwiches. He rarely drinks soda but when he does it is diet. Does not normally drink sweet tea, juice, or milk.  Current exercise: not discussed today On a statin: yes On aspirin 81 mg daily: no, on blood thinner (Eliquis) Last microalbumin/creatinine ratio: 89 (02/06/19); on an ACEi/ARB: yes Last eye exam: completed within last year Last foot exam: overdue Pneumonia vaccine:  overdue Influenza vaccine: up to date Shingrix vaccine: overdue Current glucose readings:  Dexcom G6 Clarity report reviewed today on patient's smartphone. Recent average blood glucose 230s with GMI ~9% which is improving. Blood glucose in target range 34% of the time, low <2% of the time, and high or very high 64% of the time. Fasting blood glucose not at goal 80-130. Breakfast appears to be the meal that causes the most significant increase in blood glucose. Patient does endorse occasional overnight hypoglycemia.  Continue current medications as above per endocrinology Add glucagon (Gvoke HypoPen) 1 mg mg subcutaneously as needed for hypoglycemia. May repeat dose in 15 minutes as needed. Instructed to monitor blood sugars continuously with continuous glucose monitor  Discussed management of hypoglycemia. If blood sugar <70 at any time, treat with simple sugar such as 1/2 cup juice or regular soda or 3-4 glucose tablets. Recheck blood sugar in 15 minutes and repeat if  blood sugar remains  <70.  Continue follow-up with endocrinology Will recommend that endocrinology consider increasing Ozempic to 1 mg weekly. Patient assistance program application given to patient today for him to have endocrinology help him complete.   Hypertension - New goal.: Blood pressure under good control. Blood pressure is at goal of <140/90 mmHg per 2022 AAFP guidelines. Current medications: losartan 100 mg by mouth once daily, metoprolol succinate 75 mg by mouth twice daily, and furosemide 40-80 mg by mouth daily (alternating) Intolerances: none Taking medications as directed: yes Side effects thought to be attributed to current medication regimen: no Current home blood pressure: not discussed today Continue current medications as above Encourage dietary sodium restriction/DASH diet Recommend home blood pressure monitoring to discuss at next visit Discussed need for and importance of continued work on weight loss  Hyperlipidemia/Coronary artery disease/History of stoke - New goal.: Followed by cardiology (Dr. Domenic Polite) and neurology (Amy Lomax, NP) Uncontrolled. LDL above goal of <55 due to extreme risk given established clinical ASCVD + diabetes per 2023 ADA Standards of Care in Diabetes. Triglycerides at goal of <150 per 2020 AACE/ACE guidelines. Current medications: pravastatin 40 mg by mouth once daily Intolerances:  legs cramps (atorvastatin and rosuvastatin) Taking medications as directed: yes Side effects thought to be attributed to current medication regimen: no Continue pravastatin 40 mg by mouth once daily Discussed PCSK9i, Nexletol, and ezetimibe. At this time, based on shared clinical decision making, will add ezetimibe 10 mg by mouth once daily Re-check lipid panel in 4-12 weeks. Will plan to order at next visit.  Encourage dietary reduction of high fat containing foods such as butter, nuts, bacon, egg yolks, etc. Discussed need for and importance of continued work on weight  loss Reviewed risks of hyperlipidemia, principles of treatment and consequences of untreated hyperlipidemia Continue follow-up with cardiology/neurology  Overweight/Obesity - New goal.: Unable to achieve goal weight loss through lifestyle modification alone Current treatment: semaglutide (Ozempic) 0.5 mg subcutaneously weekly  Medications previously tried:  unknown Strategies previously tried: unknown History of bariatric surgery:  unknown Baseline weight: 240 lbs; most recent weight: 218 lbs Recommend that the patient emphasize lean proteins, fruits and vegetables, whole grains and increased fiber consumption, adequate hydration Continue semaglutide (Ozempic) 0.5 mg subcutaneously weekly for now but will have endocrinology assess increasing dose to improve blood glucose control + additional weight loss benefit.  Recommend diet modification to induce energy deficit of 500 kcal/day or greater  Asthma - New goal.: Followed by Dr. Halford Chessman Controlled.  Current treatment: albuterol metered dose (ProAir, Ventolin, Proventil) 2 puffs by mouth every 6 hours as needed for shortness of breath or wheezing and fluticasone furoate/umeclidinium/vilanterol (Trelegy Ellipta) 1 inhalation once daily Last pulmonary function test: 09/20/20 Continue current medications as above Continue follow-up with pulmonology  Atrial Fibrillation - New goal.: Followed by Dr. Domenic Polite Controlled. Most recent ECG: normal sinus. Current rate control: metoprolol succinate 75 mg by mouth twice daily Patient was unable to tolerate diltiazem (edema) Anticoagulation: apixaban (Eliquis) 5 mg by mouth twice daily which is appropriate based on patient's age, renal function, and weight CHADS2VASc score: 6 - Age (1 point), Hypertension history (1 point), Stroke/TIA/Thromboembolism history (2 points), Vascular Disease history (prior MI, peripheral artery disease, or aortic plaque) (1 point), and Diabetes history (1 point) Reports  intermittent palpitations Denies signs and symptoms of bleeding Continue current medications as above Continue follow-up with cardiology  Financial: Patient reports cost concerns with several brand name medications. The following information was provided/discussed with the  patient today. Call [employer] patient assistance program [phone]) Renew Eliquis patient assistance program when you have spent at least 3% (~$1,000) of your gross annual income at the pharmacy You are eligible for the Trelegy patient assistance program through [employer] once you have spent at least $600 at the pharmacy in a calendar year Ozempic patient assistance program application printed today to be given to endocrinology to help you complete  Patient Goals/Self-Care Activities Patient will:  Take medications as prescribed Check blood sugar continuously with continuous glucose monitor, document, and provide at future appointments Check blood pressure at least once daily, document, and provide at future appointments Collaborate with provider on medication access solutions Engage in dietary modifications by fewer sweetened foods & beverages  Follow Up Plan: Face to Face appointment with care management team member scheduled for: 02/25/21       Consent to CCM Services: Mr. Kazee was given information about Chronic Care Management services including:  CCM service includes personalized support from designated clinical staff supervised by his physician, including individualized plan of care and coordination with other care providers 24/7 contact phone numbers for assistance for urgent and routine care needs. Service will only be billed when office clinical staff spend 20 minutes or more in a month to coordinate care. Only one practitioner may furnish and bill the service in a calendar month. The patient may stop CCM services at any time (effective at the end of the month) by phone call to the office  staff. The patient will be responsible for cost sharing (co-pay) of up to 20% of the service fee (after annual deductible is met).  Patient agreed to services and verbal consent obtained.   Plan: Face to Face appointment with care management team member scheduled for: 02/25/21  Patient verbalizes understanding of instructions and care plan provided today and agrees to view in [place]. Active MyChart status confirmed with patient.    Please call the care guide team at [phone] if you need to cancel or reschedule your appointment.   Kennon Holter, PharmD, [place], CPP Clinical Pharmacist Practitioner [place] [phone]

## 2021-01-31 ENCOUNTER — Telehealth: Payer: Self-pay | Admitting: Nurse Practitioner

## 2021-01-31 NOTE — Telephone Encounter (Signed)
Left a message requesting a return call to the office. 

## 2021-01-31 NOTE — Telephone Encounter (Signed)
Patient stopped by to check on the status of his pt assistance application. Please advise with pt

## 2021-02-01 DIAGNOSIS — I25119 Atherosclerotic heart disease of native coronary artery with unspecified angina pectoris: Secondary | ICD-10-CM | POA: Diagnosis not present

## 2021-02-01 DIAGNOSIS — J4531 Mild persistent asthma with (acute) exacerbation: Secondary | ICD-10-CM | POA: Diagnosis not present

## 2021-02-01 DIAGNOSIS — I10 Essential (primary) hypertension: Secondary | ICD-10-CM | POA: Diagnosis not present

## 2021-02-01 DIAGNOSIS — E11649 Type 2 diabetes mellitus with hypoglycemia without coma: Secondary | ICD-10-CM

## 2021-02-01 DIAGNOSIS — E782 Mixed hyperlipidemia: Secondary | ICD-10-CM

## 2021-02-01 DIAGNOSIS — Z794 Long term (current) use of insulin: Secondary | ICD-10-CM

## 2021-02-01 NOTE — Telephone Encounter (Signed)
Left a message requesting pt return call to the office. ?

## 2021-02-01 NOTE — Telephone Encounter (Signed)
Paul Baker has refaxed Rx to [facility] pt assistance.

## 2021-02-01 NOTE — Telephone Encounter (Signed)
Received a fax this morning from [facility] that they need the prescription faxed to them at [phone].

## 2021-02-02 DIAGNOSIS — G4733 Obstructive sleep apnea (adult) (pediatric): Secondary | ICD-10-CM | POA: Diagnosis not present

## 2021-02-09 DIAGNOSIS — E1110 Type 2 diabetes mellitus with ketoacidosis without coma: Secondary | ICD-10-CM | POA: Diagnosis not present

## 2021-02-10 DIAGNOSIS — M179 Osteoarthritis of knee, unspecified: Secondary | ICD-10-CM | POA: Diagnosis not present

## 2021-02-10 DIAGNOSIS — G4733 Obstructive sleep apnea (adult) (pediatric): Secondary | ICD-10-CM | POA: Diagnosis not present

## 2021-02-21 ENCOUNTER — Other Ambulatory Visit: Payer: Self-pay | Admitting: "Endocrinology

## 2021-02-21 ENCOUNTER — Other Ambulatory Visit: Payer: Self-pay | Admitting: *Deleted

## 2021-02-21 ENCOUNTER — Encounter: Payer: Self-pay | Admitting: Cardiology

## 2021-02-21 ENCOUNTER — Other Ambulatory Visit: Payer: Self-pay

## 2021-02-21 MED ORDER — APIXABAN 5 MG PO TABS: 5.0000 mg | ORAL_TABLET | Freq: Two times a day (BID) | ORAL | 5 refills | Status: DC

## 2021-02-21 MED ORDER — METOPROLOL SUCCINATE ER 50 MG PO TB24: 50.0000 mg | ORAL_TABLET | Freq: Two times a day (BID) | ORAL | 3 refills | Status: DC

## 2021-02-21 NOTE — Telephone Encounter (Signed)
Pt last saw Dr Domenic Polite 12/20/20, last labs 12/18/20 Creat 1.13, age 71, weight 99.1kg, based on specified criteria pt is on appropriate dosage of Eliquis 5mg  BID for afib. Will refill rx.

## 2021-02-23 DIAGNOSIS — E038 Other specified hypothyroidism: Secondary | ICD-10-CM | POA: Diagnosis not present

## 2021-02-24 LAB — TSH: TSH: 0.812 u[IU]/mL (ref 0.450–4.500)

## 2021-02-24 LAB — T4, FREE: Free T4: 1.62 ng/dL (ref 0.82–1.77)

## 2021-02-25 ENCOUNTER — Ambulatory Visit: Payer: Medicare HMO

## 2021-02-28 ENCOUNTER — Ambulatory Visit (INDEPENDENT_AMBULATORY_CARE_PROVIDER_SITE_OTHER): Payer: Medicare HMO | Admitting: Pharmacist

## 2021-02-28 DIAGNOSIS — J4531 Mild persistent asthma with (acute) exacerbation: Secondary | ICD-10-CM

## 2021-02-28 DIAGNOSIS — E11649 Type 2 diabetes mellitus with hypoglycemia without coma: Secondary | ICD-10-CM

## 2021-02-28 DIAGNOSIS — E782 Mixed hyperlipidemia: Secondary | ICD-10-CM

## 2021-02-28 DIAGNOSIS — I25119 Atherosclerotic heart disease of native coronary artery with unspecified angina pectoris: Secondary | ICD-10-CM

## 2021-02-28 DIAGNOSIS — Z8673 Personal history of transient ischemic attack (TIA), and cerebral infarction without residual deficits: Secondary | ICD-10-CM

## 2021-02-28 DIAGNOSIS — I10 Essential (primary) hypertension: Secondary | ICD-10-CM

## 2021-02-28 NOTE — Patient Instructions (Signed)
[name],  It was great to talk to you today!  Please come get your fasting lipid panel drawn at [place] within the next few weeks.   Please call me with any questions or concerns.  Visit Information  Following are the goals we discussed today:   Goals Addressed             This Visit's Progress    Medication Management       Patient Goals/Self-Care Activities Patient will:  Take medications as prescribed Check blood sugar continuously with continuous glucose monitor, document, and provide at future appointments Check blood pressure at least once daily, document, and provide at future appointments Collaborate with provider on medication access solutions Engage in dietary modifications by fewer sweetened foods & beverages           Follow-up plan: Telephone follow up appointment with care management team member scheduled for:  03/18/21  Patient verbalizes understanding of instructions and care plan provided today and agrees to view in [place]. Active MyChart status confirmed with patient.    Please call the care guide team at [phone] if you need to cancel or reschedule your appointment.   Kennon Holter, PharmD, [place], CPP Clinical Pharmacist Practitioner [place] [phone]

## 2021-02-28 NOTE — Chronic Care Management (AMB) (Signed)
Chronic Care Management Pharmacy Note  02/28/2021 Name:  Paul Baker MRN:  [id number] DOB:  05/25/1950  Summary: Type 2 Diabetes Managed by endocrinology Rayetta Pigg, NP). Next appointment March 2023. Uncontrolled but improving per continuous glucose monitor; Most recent A1c above goal of <7% per ADA guidelines Current medications: metformin 500 mg by mouth twice daily, semaglutide (Ozempic) 0.5 mg subcutaneously weekly, and Humulin R (U-500) 85 units subcutaneously before breakfast, 85 units subcutaneously before lunch, and 75 units subcutaneously before dinner Taking medications as directed: no, patient reports self reducing dose of supper Humulin R to 45-50 units on average due to trend of hypoglycemia between 11 PM - 2 AM. Current glucose readings:  Patient verbally reports GMI has improved to 8.1% per Dexcom G6 Clarity report. Patient reports less overnight hypoglycemia recently.  Continue current medications as above per endocrinology Use glucagon (Gvoke HypoPen) 1 mg subcutaneously as needed for hypoglycemia. May repeat dose in 15 minutes as needed. Instructed to monitor blood sugars continuously with continuous glucose monitor  Discussed management of hypoglycemia. If blood sugar <70 at any time, treat with simple sugar such as 1/2 cup juice or regular soda or 3-4 glucose tablets. Recheck blood sugar in 15 minutes and repeat if blood sugar remains <70.  Continue follow-up with endocrinology Will recommend that endocrinology consider increasing Ozempic to 1 mg weekly. Patient reports endocrinology has assisted him with patient assistance program application and it is pending currently. May need to reduce insulin dose with dose increase of Ozempic to reduce hypoglycemic episodes.   Hyperlipidemia/Coronary artery disease/History of stoke - Goal on Track (progressing): YES.: Uncontrolled. LDL above goal of <55 due to extreme risk given established clinical ASCVD + diabetes per  2023 ADA Standards of Care in Diabetes. Recent changes: ezetimibe 10 mg by mouth daily added at last visit Intolerances:  legs cramps (atorvastatin and rosuvastatin) Continue pravastatin 40 mg by mouth once daily and ezetimibe 10 mg by mouth daily Re-check lipid panel in within next 4 weeks. Fasting lipid panel ordered today. Will call patient with results.   Overweight/Obesity - Goal on Track (progressing): YES.: Baseline weight: 240 lbs; most recent weight: 218 lbs Continue semaglutide (Ozempic) 0.5 mg subcutaneously weekly for now but will have endocrinology assess increasing dose to improve blood glucose control + additional weight loss benefit.   Financial: Patient reports cost concerns with several brand name medications. The following information was provided/discussed with the patient at initial visit. Patient instructed to call Myrbet[employer]q [place] patient assistance program [phone]) Patient instructed he is eligible to renew Eliquis patient assistance program when he has spent at least 3% (~$1,000) of his gross annual income at the pharmacy Patient instructed he is eligible for the Trelegy patient assistance program through [place] once has has spent at least $600 at the pharmacy in a calendar year Ozempic patient assistance program application in process with assistance from endocrinology  Subjective: Paul Baker is an 71 y.o. year old male who is a primary patient of Luking, Elayne Snare, MD.  The CCM team was consulted for assistance with disease management and care coordination needs.    Engaged with patient by telephone for follow up visit in response to provider referral for pharmacy case management and/or care coordination services.   Consent to Services:  The patient was given information about Chronic Care Management services, agreed to services, and gave verbal consent prior to initiation of services.  Please see initial visit note for detailed documentation.    Patient  Care Team: Kathyrn Drown, MD as PCP - General (Family Medicine) Satira Sark, MD as PCP - Cardiology (Cardiology) Mcarthur Rossetti, MD (Orthopedic Surgery) Beryle Lathe, [facility] (Pharmacist)  Objective:  Lab Results  Component Value Date   CREATININE 1.13 12/18/2020   CREATININE 1.0 11/12/2020   CREATININE 1.43 (H) 05/23/2020    Lab Results  Component Value Date   HGBA1C 10.0 11/12/2020   Last diabetic Eye exam:  Lab Results  Component Value Date/Time   HMDIABEYEEXA No Retinopathy 12/30/2020 12:00 AM    Last diabetic Foot exam: No results found for: HMDIABFOOTEX      Component Value Date/Time   CHOL 177 08/05/2019 0334   CHOL 148 08/19/2015 0914   TRIG 95 08/05/2019 0334   HDL 56 08/05/2019 0334   HDL 53 08/19/2015 0914   CHOLHDL 3.2 08/05/2019 0334   VLDL 19 08/05/2019 0334   LDLCALC 102 (H) 08/05/2019 0334   LDLCALC 118 (H) 02/06/2019 0813    Hepatic Function Latest Ref Rng & Units 11/12/2020 05/23/2020 03/10/2020  Total Protein 6.5 - 8.1 g/dL - 6.9 6.6  Albumin 3.5 - 5.0 4.3 3.8 4.3  AST 15 - 41 U/L - 23 16  ALT 0 - 44 U/L - 23 14  Alk Phosphatase 38 - 126 U/L - 50 75  Total Bilirubin 0.3 - 1.2 mg/dL - 0.4 0.3  Bilirubin, Direct 0.00 - 0.40 mg/dL - - -    Lab Results  Component Value Date/Time   TSH 0.812 02/23/2021 09:33 AM   TSH 1.310 03/10/2020 08:05 AM   FREET4 1.62 02/23/2021 09:33 AM   FREET4 1.57 03/10/2020 08:05 AM    CBC Latest Ref Rng & Units 12/18/2020 11/12/2020 05/23/2020  WBC 4.0 - 10.5 K/uL 6.4 - 10.0  Hemoglobin 13.0 - 17.0 g/dL 12.8(L) 12.9(A) 11.8(L)  Hematocrit 39.0 - 52.0 % 39.6 - 36.4(L)  Platelets 150 - 400 K/uL 206 - 274    Lab Results  Component Value Date/Time   VD25OH 70 02/06/2019 08:13 AM    Clinical ASCVD: No  The 10-year ASCVD risk score (Arnett DK, et al., 2019) is: 35.9%   Values used to calculate the score:     Age: 43 years     Sex: Male     Is Non-Hispanic African American:  No     Diabetic: Yes     Tobacco smoker: No     Systolic Blood Pressure: 841 mmHg     Is BP treated: Yes     HDL Cholesterol: 56 mg/dL     Total Cholesterol: 177 mg/dL     Social History   Tobacco Use  Smoking Status Former   Packs/day: 1.50   Years: 10.00   Pack years: 15.00   Types: Cigarettes   Start date: 06/19/1967   Quit date: 01/03/1995   Years since quitting: 26.1  Smokeless Tobacco Never   BP Readings from Last 3 Encounters:  01/18/21 (!) 142/84  12/20/20 140/70  12/18/20 138/79   Pulse Readings from Last 3 Encounters:  01/18/21 74  12/20/20 75  12/18/20 100   Wt Readings from Last 3 Encounters:  01/18/21 218 lb 6.4 oz (99.1 kg)  12/20/20 217 lb 3.2 oz (98.5 kg)  12/14/20 225 lb 6.4 oz (102.2 kg)    Assessment: Review of patient past medical history, allergies, medications, health status, including review of consultants reports, laboratory and other test data, was performed as part of comprehensive evaluation and provision of chronic care management services.  SDOH:  (Social Determinants of Health) assessments and interventions performed:    CCM Care Plan  Allergies  Allergen Reactions   Tape Rash    Pulls off skin   Cardizem [Diltiazem Hcl]     Edema    Cardura [Doxazosin Mesylate]     Headaches / cramps   Crestor [Rosuvastatin] Other (See Comments)    Leg cramps   Lipitor [Atorvastatin] Other (See Comments)    Leg cramps   Paxil [Paroxetine Hcl]     Unknown reaction    Codeine Rash and Other (See Comments)    Headache    Gabapentin Rash    Medications Reviewed Today     Reviewed by Beryle Lathe, [facility] (Pharmacist) on 02/28/21 at 1400  Med List Status: <None>   Medication Order Taking? Sig Documenting Provider Last Dose Status Informant  acetaminophen (TYLENOL) 325 MG tablet [id number] Yes Take 2 tablets (650 mg total) by mouth every 6 (six) hours as needed for mild pain (or Fever >/= 101). Roxan Hockey, MD Taking Active    albuterol (PROVENTIL) (2.5 MG/3ML) 0.083% nebulizer solution [id number] No Take 3 mLs (2.5 mg total) by nebulization every 6 (six) hours as needed for up to 7 days for wheezing or shortness of breath.  Patient not taking: Reported on 01/28/2021   Marcello Fennel, PA-C Not Taking Active   albuterol (VENTOLIN HFA) 108 (90 Base) MCG/ACT inhaler [id number] Yes Inhale 2 puffs into the lungs every 6 (six) hours as needed for wheezing or shortness of breath. Parrett, Fonnie Mu, NP Taking Active   apixaban (ELIQUIS) 5 MG TABS tablet [id number] Yes Take 1 tablet (5 mg total) by mouth 2 (two) times daily. For stroke prevention Satira Sark, MD Taking Active   Continuous Blood Gluc Receiver ([place]) DEVI [id number] Yes 1 Piece by Does not apply route as needed. Brita Romp, NP Taking Active   Continuous Blood Gluc Sensor (DEXCOM G6 SENSOR) MISC [id number] Yes 4 Pieces by Does not apply route once a week. Cassandria Anger, MD Taking Active Self  ezetimibe (ZETIA) 10 MG tablet [id number]  Take 1 tablet (10 mg total) by mouth daily. Kathyrn Drown, MD  Active   Fluticasone-Umeclidin-Vilant (TRELEGY ELLIPTA) 100-62.5-25 MCG/INH AEPB [id number] Yes Inhale 1 puff into the lungs daily. Chesley Mires, MD Taking Active   furosemide (LASIX) 40 MG tablet [id number] Yes Take by mouth. Take 33m daily alternating with 827mevery other day. [provider] Taking Active   Glucagon (GVOKE HYPOPEN 2-PACK) 1 MG/0.2ML SOAJ [id number] Inject 1 mg subcutaneously once as needed for low blood sugar. May repeat dose in 15 minutes as needed using a new device. LuKathyrn DrownMD Unknown Active   Insulin Pen Needle (B-D ULTRAFINE III SHORT PEN) 31G X 8 MM MISC [id number] USE 1 PEN NEEDLE THREE TIMES DAILY. ReBrita RompNP Taking Active   insulin regular human CONCENTRATED (HUMULIN R U-500 KWIKPEN) 500 UNIT/ML KwikPen [id number] Inject 75-85 Units into the skin 3 (three) times daily with  meals. Inject 85 units with breakfast and lunch, and 75 units with supper if glucose is above 90 and you are eating  Patient taking differently: Inject 75-85 Units into the skin 3 (three) times daily with meals. Inject 85 units with breakfast and lunch, and 45-50 units with supper if glucose is above 90 and you are eating   ReBrita RompNP Taking Active Self  levothyroxine (SYNTHROID) 137 MCG tablet [id number] TAKE  1 TABLET BY MOUTH ONCE DAILY BEFORE BREAKFAST Brita Romp, NP Taking Active   loratadine (CLARITIN) 10 MG tablet [id number] Yes Take 10 mg by mouth daily as needed for allergies.  [provider] Taking Active Self           Med Note [facility] MENDEZ, Adair Laundry   Tue Jul 01, 2019  4:07 PM)    losartan (COZAAR) 100 MG tablet [id number] Yes Take 100 mg by mouth daily. [provider] Taking Active   metFORMIN (GLUCOPHAGE) 500 MG tablet [id number] Yes TAKE 1 TABLET BY MOUTH TWICE DAILY WITH MEALS Reardon, Juanetta Beets, NP Taking Active   metoprolol succinate (TOPROL XL) 25 MG 24 hr tablet [id number] Yes Take 1 tablet (25 mg total) by mouth See admin instructions. -Take 1 tablet (25 mg) along with an additional 50 mg tablet for total of 75 mg twice daily Satira Sark, MD Taking Active   metoprolol succinate (TOPROL-XL) 50 MG 24 hr tablet [id number] Yes Take 1 tablet (50 mg total) by mouth 2 (two) times daily. -Take 1 tablet (50 mg) along with an additional 25 mg tablet for total of 75 mg twice daily Satira Sark, MD Taking Active   modafinil (PROVIGIL) 200 MG tablet [id number] No Take 1 tablet (200 mg total) by mouth daily.  Patient not taking: Reported on 02/28/2021   Debbora Presto, NP Not Taking Active   MYRBETRIQ 50 MG TB24 tablet [id number] Yes Take 50 mg by mouth daily. [provider] Taking Active   nitroGLYCERIN (NITROSTAT) 0.4 MG SL tablet [id number] No DISSOLVE 1 TABLET UNDER TONGUE EVERY 5 MINUTES UP TO 15 MIN FOR CHESTPAIN. IF NO RELIEF CALL  911.  Patient not taking: Reported on 01/28/2021   Satira Sark, MD Not Taking Active   ONE TOUCH ULTRA TEST test strip [id number] Yes TEST BLOOD SUGAR UP TO 4 TIMES DAILY. Cassandria Anger, MD Taking Active Self  [facility] LANCETS [address] [place] [id number] Yes USE AS DIRECTED TO TEST BLOOD SUGAR 4 TIMES DAILY. Cassandria Anger, MD Taking Active Self  pravastatin (PRAVACHOL) 40 MG tablet [id number] Yes TAKE 1 TABLET BY MOUTH ONCE DAILY IN THE Ophelia Shoulder, MD Taking Active   rOPINIRole (REQUIP XL) 4 MG 24 hr tablet [id number] Yes Take 1 tablet (4 mg total) by mouth at bedtime. Lomax, Amy, NP Taking Active   rOPINIRole (REQUIP) 4 MG tablet [id number] Yes Take 0.5 tablets (2 mg total) by mouth 2 (two) times daily. Lomax, Amy, NP Taking Active   Semaglutide,0.25 or 0.5MG/DOS, (OZEMPIC, 0.25 OR 0.5 MG/DOSE,) 2 MG/1.5ML SOPN [id number] Yes Inject 0.5 mg into the skin once a week. Brita Romp, NP Taking Active            Med Note Waldo Laine, Gwenyth Allegra   Fri Jan 28, 2021  3:11 PM) Every Wednesday            Patient Active Problem List   Diagnosis Date Noted   Other long term (current) drug therapy 01/18/2021   COVID-19 virus infection 05/21/2020   Acute coronary syndrome without high troponin ([place]) 12/05/2019   Grade I diastolic dysfunction 45/99/7741   Type 2 diabetes mellitus (HCC)    Paroxysmal atrial fibrillation (HCC)    Hypothyroidism    Acute bacterial rhinosinusitis 11/18/2019   Cough 11/18/2019   Paresthesia of skin 08/05/2019   Asthma 08/05/2019   Class 2 obesity 08/05/2019   Right sided weakness 08/05/2019   Vertigo 08/04/2019  Dizziness 02/18/2019   Type 2 diabetes mellitus with hypoglycemia without coma ([place]) 03/31/2017   AKI (acute kidney injury) ([place]) 03/30/2017   Right patellar tendon rupture 12/04/2014   Mixed hyperlipidemia 11/25/2014   Other specified hypothyroidism 11/25/2014   S/P right TKA 03/23/2014   S/P knee replacement  03/23/2014   Spinal stenosis of cervicothoracic region 10/08/2013   RLS (restless legs syndrome) 10/08/2013   DM type 2 causing vascular disease ([place]) 10/08/2013   Preoperative cardiovascular examination 08/14/2013   Sleep apnea with use of continuous positive airway pressure (CPAP) 04/02/2013   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/02/2013   Diabetic neuropathy ([place]) 07/23/2012   Obesity, morbid ([place]) 05/01/2012   Hypersomnia, persistent 05/01/2012   OSA on CPAP 04/19/2012   Chest pain 04/19/2012   Essential hypertension, benign 12/16/2009   CORONARY ATHEROSCLEROSIS NATIVE CORONARY ARTERY 12/16/2009   Hyperglycemia due to type 2 diabetes mellitus ([place]) 11/30/2009   Accelerating angina ([place]) 11/30/2009    Immunization History  Administered Date(s) Administered   Influenza Split 09/18/2012   Influenza,inj,Quad PF,6+ Mos 10/13/2013, 10/06/2014, 09/11/2018   Influenza-Unspecified 12/01/2015, 11/08/2016, 11/02/2020   PFIZER(Purple [place])SARS-COV-2 Vaccination 03/06/2019, 03/27/2019   Pneumococcal Polysaccharide-23 10/02/2004, 10/03/2011   Td 05/24/2006    Conditions to be addressed/monitored: Atrial Fibrillation, CAD, HTN, HLD, DMII, Asthma, and obesity  Care Plan : Medication Management  Updates made by Beryle Lathe, [place] since 02/28/2021 12:00 AM     Problem: T2DM, HTN, HLD/CAD, Afib, Obesity, Asthma   Priority: High  Onset Date: 01/28/2021     Long-Range Goal: Disease Progression Prevention   Start Date: 01/28/2021  Expected End Date: 04/28/2021  Recent Progress: On track  Priority: High  Note:   Current Barriers:  Unable to independently afford treatment regimen Unable to achieve control of diabetes, hyperlipidemia, and weight management  Pharmacist Clinical Goal(s):  Through collaboration with PharmD and provider, patient will  Verbalize ability to afford treatment regimen Achieve control of diabetes, hyperlipidemia, and weight management as  evidenced by improved fasting blood sugar, improved A1c, improved LDL, and continued weight loss   Interventions: 1:1 collaboration with Kathyrn Drown, MD regarding development and update of comprehensive plan of care as evidenced by provider attestation and co-signature Inter-disciplinary care team collaboration (see longitudinal plan of care) Comprehensive medication review performed; medication list updated in electronic medical record  Type 2 Diabetes - Goal on Track (progressing): YES.: Managed by endocrinology Rayetta Pigg, NP) Uncontrolled but improving per continuous glucose monitor; Most recent A1c above goal of <7% per ADA guidelines Current medications: metformin 500 mg by mouth twice daily, semaglutide (Ozempic) 0.5 mg subcutaneously weekly, and Humulin R (U-500) 85 units subcutaneously before breakfast, 85 units subcutaneously before lunch, and 75 units subcutaneously before dinner Intolerances: none Taking medications as directed: no, patient reports self reducing dose of supper Humulin R to 45-50 units on average due to trend of hypoglycemia between 11 PM - 2 AM. Side effects thought to be attributed to current medication regimen:  some mild, tolerable nausea with Ozempic Patient reports he feels sweaty when he has hypoglycemic episodes. All other symptoms likely masked by beta blocker therapy.  Emergency hypoglycemia treatment:  Patient has Gvoke on hand if needed  Current meal patterns:  patient reports he usually eats eggs, bacon, and oatmeal for breakfast with black coffee. For lunch and dinner he eats soups and sandwiches. He rarely drinks soda but when he does it is diet. Does not normally drink sweet tea, juice, or milk.  Current exercise: not discussed today On a statin: yes On aspirin 81 mg daily: no, on blood thinner (Eliquis) Last microalbumin/creatinine ratio: 89 (02/06/19); on an ACEi/ARB: yes Current glucose readings:  Patient verbally reports GMI has improved to  8.1% per Dexcom G6 Clarity report. Patient reports less overnight hypoglycemia recently.  Continue current medications as above per endocrinology Use glucagon (Gvoke HypoPen) 1 mg subcutaneously as needed for hypoglycemia. May repeat dose in 15 minutes as needed. Instructed to monitor blood sugars continuously with continuous glucose monitor  Discussed management of hypoglycemia. If blood sugar <70 at any time, treat with simple sugar such as 1/2 cup juice or regular soda or 3-4 glucose tablets. Recheck blood sugar in 15 minutes and repeat if blood sugar remains <70.  Continue follow-up with endocrinology Will recommend that endocrinology consider increasing Ozempic to 1 mg weekly. Patient reports endocrinology has assisted him with patient assistance program application and it is pending currently. May need to reduce insulin dose with dose increase of Ozempic to reduce hypoglycemic episodes.   Hypertension - Condition stable. Not addressed this visit.: Blood pressure under good control. Blood pressure is at goal of <140/90 mmHg per 2022 AAFP guidelines. Current medications: losartan 100 mg by mouth once daily, metoprolol succinate 75 mg by mouth twice daily, and furosemide 40-80 mg by mouth daily (alternating) Intolerances:  diltiazem (edema) Taking medications as directed: yes Side effects thought to be attributed to current medication regimen: no Current home blood pressure: not discussed today Continue current medications as above Encourage dietary sodium restriction/DASH diet Recommend home blood pressure monitoring to discuss at next visit Discussed need for and importance of continued work on weight loss  Hyperlipidemia/Coronary artery disease/History of stoke - Goal on Track (progressing): YES.: Followed by cardiology (Dr. Domenic Polite) and neurology (Amy Lomax, NP) Uncontrolled. LDL above goal of <55 due to extreme risk given established clinical ASCVD + diabetes per 2023 ADA Standards of  Care in Diabetes. Triglycerides at goal of <150 per 2020 AACE/ACE guidelines. Current medications: pravastatin 40 mg by mouth once daily and ezetimibe 10 mg by mouth once daily Recent changes: ezetimibe 10 mg by mouth daily added at last visit Intolerances:  legs cramps (atorvastatin and rosuvastatin) Taking medications as directed: yes Side effects thought to be attributed to current medication regimen: no Continue pravastatin 40 mg by mouth once daily and ezetimibe 10 mg by mouth daily Re-check lipid panel in within next 4 weeks. Fasting lipid panel ordered today. Will call patient with results.  Encourage dietary reduction of high fat containing foods such as butter, nuts, bacon, egg yolks, etc. Discussed need for and importance of continued work on weight loss Reviewed risks of hyperlipidemia, principles of treatment and consequences of untreated hyperlipidemia Continue follow-up with cardiology/neurology  Overweight/Obesity - Goal on Track (progressing): YES.: Unable to achieve goal weight loss through lifestyle modification alone Current treatment: semaglutide (Ozempic) 0.5 mg subcutaneously weekly  Baseline weight: 240 lbs; most recent weight: 218 lbs Recommend that the patient emphasize lean proteins, fruits and vegetables, whole grains and increased fiber consumption, adequate hydration Continue semaglutide (Ozempic) 0.5 mg subcutaneously weekly for now but will have endocrinology assess increasing dose to improve blood glucose control + additional weight loss benefit.  Recommend diet modification to induce energy deficit of 500 kcal/day or greater  Asthma - Condition stable. Not addressed this visit.: Followed by Dr. Halford Chessman Controlled.  Current treatment: albuterol metered dose (ProAir, Ventolin, Proventil) 2 puffs by mouth every 6 hours as needed for shortness of  breath or wheezing and fluticasone furoate/umeclidinium/vilanterol (Trelegy Ellipta) 1 inhalation once daily Last  pulmonary function test: 09/20/20 Continue current medications as above Continue follow-up with pulmonology  Atrial Fibrillation - Condition stable. Not addressed this visit.: Followed by Dr. Domenic Polite Controlled. Most recent ECG: normal sinus. Current rate control: metoprolol succinate 75 mg by mouth twice daily Patient was unable to tolerate diltiazem (edema) Anticoagulation: apixaban (Eliquis) 5 mg by mouth twice daily which is appropriate based on patient's age, renal function, and weight CHADS2VASc score: 6 - Age (1 point), Hypertension history (1 point), Stroke/TIA/Thromboembolism history (2 points), Vascular Disease history (prior MI, peripheral artery disease, or aortic plaque) (1 point), and Diabetes history (1 point) Reports intermittent palpitations Denies signs and symptoms of bleeding Continue current medications as above Continue follow-up with cardiology  Financial: Patient reports cost concerns with several brand name medications. The following information was provided/discussed with the patient at initial visit. Patient instructed to call [employer] [place] patient assistance program [phone]) Patient instructed he is eligible to renew Eliquis patient assistance program when he has spent at least 3% (~$1,000) of his gross annual income at the pharmacy Patient instructed he is eligible for the Trelegy patient assistance program through [place] once has has spent at least $600 at the pharmacy in a calendar year Ozempic patient assistance program application in process with assistance from endocrinology  Patient Goals/Self-Care Activities Patient will:  Take medications as prescribed Check blood sugar continuously with continuous glucose monitor, document, and provide at future appointments Check blood pressure at least once daily, document, and provide at future appointments Collaborate with provider on medication access solutions Engage in dietary modifications by fewer  sweetened foods & beverages  Follow Up Plan: Telephone follow up appointment with care management team member scheduled for: 03/18/21       Medication Assistance:  see care plan  Patient's preferred pharmacy is:  [place], [place] - [address] [place] [address] Phone: [phone] Fax: [phone]  [place] [address] [place] [address] Phone: [phone] Fax: [place], [place]. [place] [address] Phone: [phone] Fax: [phone]  Follow Up:  Patient agrees to Care Plan and Follow-up.  Plan: Telephone follow up appointment with care management team member scheduled for:  03/18/21  Kennon Holter, PharmD, BCACP, CPP Clinical Pharmacist Practitioner [place] [phone]

## 2021-03-01 ENCOUNTER — Ambulatory Visit: Payer: Medicare HMO | Admitting: Nurse Practitioner

## 2021-03-01 DIAGNOSIS — E782 Mixed hyperlipidemia: Secondary | ICD-10-CM

## 2021-03-01 DIAGNOSIS — J4531 Mild persistent asthma with (acute) exacerbation: Secondary | ICD-10-CM | POA: Diagnosis not present

## 2021-03-01 DIAGNOSIS — Z794 Long term (current) use of insulin: Secondary | ICD-10-CM | POA: Diagnosis not present

## 2021-03-01 DIAGNOSIS — I10 Essential (primary) hypertension: Secondary | ICD-10-CM | POA: Diagnosis not present

## 2021-03-01 DIAGNOSIS — E11649 Type 2 diabetes mellitus with hypoglycemia without coma: Secondary | ICD-10-CM | POA: Diagnosis not present

## 2021-03-01 DIAGNOSIS — I25119 Atherosclerotic heart disease of native coronary artery with unspecified angina pectoris: Secondary | ICD-10-CM | POA: Diagnosis not present

## 2021-03-07 ENCOUNTER — Other Ambulatory Visit: Payer: Self-pay

## 2021-03-07 ENCOUNTER — Encounter: Payer: Self-pay | Admitting: Nurse Practitioner

## 2021-03-07 ENCOUNTER — Ambulatory Visit: Payer: Medicare HMO | Admitting: Nurse Practitioner

## 2021-03-07 VITALS — BP 142/68 | HR 86 | Ht 65.0 in | Wt 222.2 lb

## 2021-03-07 DIAGNOSIS — E1159 Type 2 diabetes mellitus with other circulatory complications: Secondary | ICD-10-CM

## 2021-03-07 LAB — POCT GLYCOSYLATED HEMOGLOBIN (HGB A1C): HbA1c, POC (controlled diabetic range): 8.8 % — AB (ref 0.0–7.0)

## 2021-03-07 MED ORDER — HUMULIN R U-500 KWIKPEN 500 UNIT/ML ~~LOC~~ SOPN: 65.0000 [IU] | PEN_INJECTOR | Freq: Three times a day (TID) | SUBCUTANEOUS | 2 refills | Status: DC

## 2021-03-07 MED ORDER — SEMAGLUTIDE (1 MG/DOSE) 4 MG/3ML ~~LOC~~ SOPN: 1.0000 mg | PEN_INJECTOR | SUBCUTANEOUS | 3 refills | Status: DC

## 2021-03-07 NOTE — Progress Notes (Signed)
03/07/2021   Endocrinology follow-up note   Subjective:    Patient ID: Paul Baker, male    DOB: [birth year], PCP Kathyrn Drown, MD   Past Medical History:  Diagnosis Date   Arthritis    Asthma    Atrial fibrillation [facility])    Diagnosed December 2021   Colon polyp    Coronary atherosclerosis of native coronary artery    a. 2011: cath showing 90% stenosis along small non-dominant RCA (too small for PCI). b. 01/2018: cath showing nonobstructive CAD with 60 to 70% proximal to mid nondominant RCA stenosis, 50% mid LAD and 40 to 50% OM1   DVT (deep venous thrombosis) ([place]) 2005   Right arm   Essential hypertension    Headache    History of transfusion    Hypothyroidism    Mixed hyperlipidemia    Morbid obesity ([place])    MRSA (methicillin resistant staph aureus) culture positive    08/2012   Narcolepsy    OSA (obstructive sleep apnea)    CPAP   Osteoarthritis    Pneumonia    Psoriasis    Pulmonary embolism ([place]) 2004   RLS (restless legs syndrome)    Rotator cuff disorder    Left   Septic arthritis of knee, left (HCC)    Skin cancer, basal cell    Type 2 diabetes mellitus ([place])    Past Surgical History:  Procedure Laterality Date   BACK SURGERY     CATARACT EXTRACTION Bilateral    COLONOSCOPY     CYST REMOVAL TRUNK     from back   EYE SURGERY Left 2016   laser to left eye   HIP SURGERY     bone removed from both sides of hip   KNEE ARTHROTOMY Right 12/04/2014   Procedure: KNEE ARTHROTOMY PATELLA LIGAMENT RECONSTRUSION AND REPAIR RIGHT KNEE;  Surgeon: Paralee Cancel, MD;  Location: [facility];  Service: Orthopedics;  Laterality: Right;   KNEE SURGERY     X 25 TIMES   LEFT HEART CATH AND CORONARY ANGIOGRAPHY N/A 01/17/2018   Procedure: LEFT HEART CATH AND CORONARY ANGIOGRAPHY;  Surgeon: Belva Crome, MD;  Location: [facility];  Service: Cardiovascular;  Laterality: N/A;   LUMBAR DISC SURGERY     Left L3, L4, L5 discecotomy with decompression of L4 root    TONSILLECTOMY     TOTAL KNEE ARTHROPLASTY  2003   LEFT   TOTAL KNEE ARTHROPLASTY Right 03/23/2014   Procedure: RIGHT TOTAL KNEE ARTHROPLASTY AND REMOVAL RIGHT TIBIAL  DEEP IMPLANT STAPLE;  Surgeon: Paralee Cancel, MD;  Location: [facility];  Service: Orthopedics;  Laterality: Right;   TOTAL KNEE REVISION  2005   LEFT   WRIST SURGERY     Social History   Socioeconomic History   Marital status: Married    Spouse name: Oleta   Number of children: 2   Years of education: college   Highest education level: Not on file  Occupational History   Occupation: Disabled    Employer: UNEMPLOYED  Tobacco Use   Smoking status: Former    Packs/day: 1.50    Years: 10.00    Pack years: 15.00    Types: Cigarettes    Start date: 06/19/1967    Quit date: 01/03/1995    Years since quitting: 26.1   Smokeless tobacco: Never  Vaping Use   Vaping Use: Never used  Substance and Sexual Activity   Alcohol use: No    Alcohol/week: 0.0 standard drinks  Comment: quit drinking in 07/86   Drug use: No   Sexual activity: Yes    Partners: Female  Other Topics Concern   Not on file  Social History Narrative    71 year old, right-handed, caucasian male with a past medical history of obesity, hypertension, hyperlipidemia, diabetes, obstructive sleep apnea, presenting with frequent nighttime awakenings, excessive daytime sleepiness, also transient confusional episodes.RLS and one beosity, OSA on CPAP with AHI of 3.2 and  setting of 16 cm water , [facility] .   Married since 1977.   Social Determinants of Health   Financial Resource Strain: Low Risk    Difficulty of Paying Living Expenses: Not hard at all  Food Insecurity: No Food Insecurity   Worried About Charity fundraiser in the Last Year: Never true   [place] in the Last Year: Never true  Transportation Needs: No Transportation Needs   Lack of Transportation (Medical): No   Lack of Transportation (Non-Medical): No  Physical Activity:  Sufficiently Active   Days of Exercise per Week: 3 days   Minutes of Exercise per Session: 60 min  Stress: No Stress Concern Present   Feeling of Stress : Only a little  Social Connections: Engineer, building services of Communication with Friends and Family: More than three times a week   Frequency of Social Gatherings with Friends and Family: More than three times a week   Attends Religious Services: More than 4 times per year   Active Member of [employer] or Organizations: Yes   Attends Archivist Meetings: More than 4 times per year   Marital Status: Married   Outpatient Encounter Medications as of 03/07/2021  Medication Sig   acetaminophen (TYLENOL) 325 MG tablet Take 2 tablets (650 mg total) by mouth every 6 (six) hours as needed for mild pain (or Fever >/= 101).   albuterol (PROVENTIL) (2.5 MG/3ML) 0.083% nebulizer solution Take 3 mLs (2.5 mg total) by nebulization every 6 (six) hours as needed for up to 7 days for wheezing or shortness of breath.   albuterol (VENTOLIN HFA) 108 (90 Base) MCG/ACT inhaler Inhale 2 puffs into the lungs every 6 (six) hours as needed for wheezing or shortness of breath.   alfuzosin (UROXATRAL) 10 MG 24 hr tablet Take 10 mg by mouth daily.   apixaban (ELIQUIS) 5 MG TABS tablet Take 1 tablet (5 mg total) by mouth 2 (two) times daily. For stroke prevention   Continuous Blood Gluc Receiver (DEXCOM G6 RECEIVER) DEVI 1 Piece by Does not apply route as needed.   Continuous Blood Gluc Sensor (DEXCOM G6 SENSOR) MISC 4 Pieces by Does not apply route once a week.   ezetimibe (ZETIA) 10 MG tablet Take 1 tablet (10 mg total) by mouth daily.   Fluticasone-Umeclidin-Vilant (TRELEGY ELLIPTA) 100-62.5-25 MCG/INH AEPB Inhale 1 puff into the lungs daily.   furosemide (LASIX) 40 MG tablet Take by mouth. Take '40mg'$  daily alternating with '80mg'$  every other day.   Glucagon (GVOKE HYPOPEN 2-PACK) 1 MG/0.2ML SOAJ Inject 1 mg subcutaneously once as needed for low blood sugar.  May repeat dose in 15 minutes as needed using a new device.   Insulin Pen Needle (B-D ULTRAFINE III SHORT PEN) 31G X 8 MM MISC USE 1 PEN NEEDLE THREE TIMES DAILY.   levothyroxine (SYNTHROID) 137 MCG tablet TAKE 1 TABLET BY MOUTH ONCE DAILY BEFORE BREAKFAST   loratadine (CLARITIN) 10 MG tablet Take 10 mg by mouth daily as needed for allergies.  losartan (COZAAR) 100 MG tablet Take 100 mg by mouth daily.   metFORMIN (GLUCOPHAGE) 500 MG tablet TAKE 1 TABLET BY MOUTH TWICE DAILY WITH MEALS   metoprolol succinate (TOPROL XL) 25 MG 24 hr tablet Take 1 tablet (25 mg total) by mouth See admin instructions. -Take 1 tablet (25 mg) along with an additional 50 mg tablet for total of 75 mg twice daily   metoprolol succinate (TOPROL-XL) 50 MG 24 hr tablet Take 1 tablet (50 mg total) by mouth 2 (two) times daily. -Take 1 tablet (50 mg) along with an additional 25 mg tablet for total of 75 mg twice daily   modafinil (PROVIGIL) 200 MG tablet Take 1 tablet (200 mg total) by mouth daily.   MYRBETRIQ 50 MG TB24 tablet Take 50 mg by mouth daily.   nitroGLYCERIN (NITROSTAT) 0.4 MG SL tablet DISSOLVE 1 TABLET UNDER TONGUE EVERY 5 MINUTES UP TO 15 MIN FOR CHESTPAIN. IF NO RELIEF CALL 911.   ONE TOUCH ULTRA TEST test strip TEST BLOOD SUGAR UP TO 4 TIMES DAILY.   ONETOUCH DELICA LANCETS 27C MISC USE AS DIRECTED TO TEST BLOOD SUGAR 4 TIMES DAILY.   pravastatin (PRAVACHOL) 40 MG tablet TAKE 1 TABLET BY MOUTH ONCE DAILY IN THE EVENING   rOPINIRole (REQUIP XL) 4 MG 24 hr tablet Take 1 tablet (4 mg total) by mouth at bedtime.   rOPINIRole (REQUIP) 4 MG tablet Take 0.5 tablets (2 mg total) by mouth 2 (two) times daily.   Semaglutide, 1 MG/DOSE, 4 MG/3ML SOPN Inject 1 mg as directed once a week.   [DISCONTINUED] insulin regular human CONCENTRATED (HUMULIN R U-500 KWIKPEN) 500 UNIT/ML KwikPen Inject 75-85 Units into the skin 3 (three) times daily with meals. Inject 85 units with breakfast and lunch, and 75 units with supper if  glucose is above 90 and you are eating (Patient taking differently: Inject 75-85 Units into the skin 3 (three) times daily with meals. Inject 85 units with breakfast and lunch, and 45-50 units with supper if glucose is above 90 and you are eating)   [DISCONTINUED] Semaglutide,0.25 or 0.'5MG'$ /DOS, (OZEMPIC, 0.25 OR 0.5 MG/DOSE,) 2 MG/1.5ML SOPN Inject 0.5 mg into the skin once a week.   insulin regular human CONCENTRATED (HUMULIN R U-500 KWIKPEN) 500 UNIT/ML KwikPen Inject 65-85 Units into the skin 3 (three) times daily with meals. Inject 85 units with breakfast and lunch, and 65 units with supper if glucose is above 90 and you are eating   No facility-administered encounter medications on file as of 03/07/2021.   ALLERGIES: Allergies  Allergen Reactions   Tape Rash    Pulls off skin   Cardizem [Diltiazem Hcl]     Edema    Cardura [Doxazosin Mesylate]     Headaches / cramps   Crestor [Rosuvastatin] Other (See Comments)    Leg cramps   Lipitor [Atorvastatin] Other (See Comments)    Leg cramps   Paxil [Paroxetine Hcl]     Unknown reaction    Codeine Rash and Other (See Comments)    Headache    Gabapentin Rash   VACCINATION STATUS: Immunization History  Administered Date(s) Administered   Influenza Split 09/18/2012   Influenza,inj,Quad PF,6+ Mos 10/13/2013, 10/06/2014, 09/11/2018   Influenza-Unspecified 12/01/2015, 11/08/2016, 11/02/2020   PFIZER(Purple Top)SARS-COV-2 Vaccination 03/06/2019, 03/27/2019   Pneumococcal Polysaccharide-23 10/02/2004, 10/03/2011   Td 05/24/2006    Diabetes He presents for his follow-up diabetic visit. He has type 2 diabetes mellitus. Onset time: Was diagnosed at approximate age of 71 years.  His disease course has been improving. Hypoglycemia symptoms include sweats. Pertinent negatives for hypoglycemia include no confusion, headaches, nervousness/anxiousness, pallor, seizures or tremors. Associated symptoms include fatigue and foot paresthesias. Pertinent  negatives for diabetes include no chest pain, no polydipsia, no polyphagia, no polyuria, no weakness and no weight loss. Hypoglycemia complications include nocturnal hypoglycemia. Symptoms are improving. Diabetic complications include a CVA, heart disease, nephropathy and peripheral neuropathy. (Has had 2 mini strokes ) Risk factors for coronary artery disease include diabetes mellitus, dyslipidemia, male sex, obesity, sedentary lifestyle, tobacco exposure and hypertension. Current diabetic treatment includes oral agent (dual therapy), intensive insulin program and oral agent (monotherapy) (and Ozempic). He is compliant with treatment most of the time. His weight is fluctuating minimally. He is following a generally healthy diet. When asked about meal planning, he reported none. He has had a previous visit with a dietitian. He never participates in exercise. His home blood glucose trend is decreasing steadily. His breakfast blood glucose range is generally >200 mg/dl. His lunch blood glucose range is generally >200 mg/dl. His dinner blood glucose range is generally >200 mg/dl. His bedtime blood glucose range is generally >200 mg/dl. (He presents today with his CGM, no logs, showing improving glycemic profile with tight overnight readings and high morning readings as a result.  His POCT A1c today is 8.8%, improving from last visit of 10%.  He reports he has tolerated the Ozempic well and can see a big difference in his glucose readings.  Analysis of his CGM shows TIR 35%, TAR 62%, TBR 3% with a GMI of 8.3%. ) An ACE inhibitor/angiotensin II receptor blocker is being taken. He sees a podiatrist.Eye exam is current.  Hyperlipidemia This is a chronic problem. The current episode started more than 1 year ago. The problem is uncontrolled. Recent lipid tests were reviewed and are high. Exacerbating diseases include chronic renal disease, diabetes, hypothyroidism and obesity. There are no known factors aggravating his  hyperlipidemia. Pertinent negatives include no chest pain, myalgias or shortness of breath. Current antihyperlipidemic treatment includes statins. The current treatment provides mild improvement of lipids. Compliance problems include adherence to diet and adherence to exercise.  Risk factors for coronary artery disease include dyslipidemia, diabetes mellitus, hypertension, male sex, obesity and a sedentary lifestyle.  Hypertension This is a chronic problem. The current episode started more than 1 year ago. The problem has been gradually improving since onset. The problem is uncontrolled. Associated symptoms include sweats. Pertinent negatives include no chest pain, headaches, neck pain, palpitations or shortness of breath. Agents associated with hypertension include thyroid hormones. Risk factors for coronary artery disease include diabetes mellitus, dyslipidemia, male gender, obesity, sedentary lifestyle and smoking/tobacco exposure. Past treatments include angiotensin blockers, beta blockers and diuretics. Compliance problems include exercise and diet.  Hypertensive end-organ damage includes kidney disease, CAD/MI and CVA. Identifiable causes of hypertension include chronic renal disease and a thyroid problem.  Thyroid Problem Presents for follow-up visit. Symptoms include fatigue and leg swelling. Patient reports no anxiety, cold intolerance, constipation, depressed mood, diarrhea, heat intolerance, palpitations, tremors, weight gain or weight loss. The symptoms have been stable. Past treatments include levothyroxine. His past medical history is significant for diabetes and hyperlipidemia.    Review of systems  Constitutional: + stable body weight,  current Body mass index is 36.98 kg/m. , no fatigue, no subjective hyperthermia, no subjective hypothermia, reports mild memory impairment since mini strokes Eyes: no blurry vision, no xerophthalmia ENT: no sore throat, no nodules palpated in throat, no  dysphagia/odynophagia, no hoarseness Cardiovascular: no chest pain, no shortness of breath, no palpitations,  Respiratory: no cough, no shortness of breath Gastrointestinal: no nausea/vomiting/diarrhea Musculoskeletal: no muscle/joint aches Skin: no rashes, no hyperemia Neurological: no tremors, no numbness, no tingling, no dizziness, + intermittent numbness/tingling to bilateral feet Psychiatric: no depression, no anxiety    Objective:    BP (!) 142/68    Pulse 86    Ht '5\' 5"'$  (1.651 m)    Wt 222 lb 3.2 oz (100.8 kg)    SpO2 98%    BMI 36.98 kg/m   Wt Readings from Last 3 Encounters:  03/07/21 222 lb 3.2 oz (100.8 kg)  01/18/21 218 lb 6.4 oz (99.1 kg)  12/20/20 217 lb 3.2 oz (98.5 kg)     BP Readings from Last 3 Encounters:  03/07/21 (!) 142/68  01/18/21 (!) 142/84  12/20/20 140/70     Physical Exam- Limited  Constitutional:  Body mass index is 36.98 kg/m. , not in acute distress, normal state of mind Eyes:  EOMI, no exophthalmos Neck: Supple Cardiovascular: Irregular (hx afib), no murmurs, rubs, or gallops, no edema Respiratory: Adequate breathing efforts, no crackles, rales, rhonchi, or wheezing Musculoskeletal: no gross deformities, strength intact in all four extremities, no gross restriction of joint movements Skin:  no rashes, no hyperemia Neurological: no tremor with outstretched hands     CMP Latest Ref Rng & Units 12/18/2020 11/12/2020 05/23/2020  Glucose 70 - 99 mg/dL 324(H) - 196(H)  BUN 8 - 23 mg/dL 32(H) 24(A) 37(H)  Creatinine 0.61 - 1.24 mg/dL 1.13 1.0 1.43(H)  Sodium 135 - 145 mmol/L 136 138 137  Potassium 3.5 - 5.1 mmol/L 4.1 4.3 3.8  Chloride 98 - 111 mmol/L 99 99 103  CO2 22 - 32 mmol/L 26 24(A) 24  Calcium 8.9 - 10.3 mg/dL 9.3 9.6 9.3  Total Protein 6.5 - 8.1 g/dL - - 6.9  Total Bilirubin 0.3 - 1.2 mg/dL - - 0.4  Alkaline Phos 38 - 126 U/L - - 50  AST 15 - 41 U/L - - 23  ALT 0 - 44 U/L - - 23     Diabetic Labs (most recent): Lab Results   Component Value Date   HGBA1C 8.8 (A) 03/07/2021   HGBA1C 10.0 11/12/2020   HGBA1C 8.6 (A) 06/22/2020   Lipid Panel     Component Value Date/Time   CHOL 177 08/05/2019 0334   CHOL 148 08/19/2015 0914   TRIG 95 08/05/2019 0334   HDL 56 08/05/2019 0334   HDL 53 08/19/2015 0914   CHOLHDL 3.2 08/05/2019 0334   VLDL 19 08/05/2019 0334   LDLCALC 102 (H) 08/05/2019 0334   LDLCALC 118 (H) 02/06/2019 0813     Assessment & Plan:   1) Uncontrolled type 2 diabetes mellitus with circulatory complication, with long-term current use of insulin (HCC)  -His diabetes is complicated by coronary artery disease and patient remains at an extremely high risk for more acute and chronic complications of diabetes which include CAD, CVA, CKD, retinopathy, and neuropathy. These are all discussed in detail with the patient.  He presents today with his CGM, no logs, showing improving glycemic profile with tight overnight readings and high morning readings as a result.  His POCT A1c today is 8.8%, improving from last visit of 10%.  He reports he has tolerated the Ozempic well and can see a big difference in his glucose readings.  Analysis of his CGM shows TIR 35%, TAR 62%, TBR 3% with a  GMI of 8.3%.     Glucose logs and insulin administration records pertaining to this visit,  to be scanned into patient's records.  Recent labs reviewed.  - Nutritional counseling repeated at each appointment due to patients tendency to fall back in to old habits.  - The patient admits there is a room for improvement in their diet and drink choices. -  Suggestion is made for the patient to avoid simple carbohydrates from their diet including Cakes, Sweet Desserts / Pastries, Ice Cream, Soda (diet and regular), Sweet Tea, Candies, Chips, Cookies, Sweet Pastries, Store Bought Juices, Alcohol in Excess of 1-2 drinks a day, Artificial Sweeteners, Coffee Creamer, and "Sugar-free" Products. This will help patient to have stable blood  glucose profile and potentially avoid unintended weight gain.   - I encouraged the patient to switch to unprocessed or minimally processed complex starch and increased protein intake (animal or plant source), fruits, and vegetables.   - Patient is advised to stick to a routine mealtimes to eat 3 meals a day and avoid unnecessary snacks (to snack only to correct hypoglycemia).  - I have approached patient with the following individualized plan to manage diabetes and patient agrees.  -He will continue to need intensive treatment with higher dose of insulin.  He has been better with U500 versus [place] and regular insulin.     -Given his tendency for nocturnal hypoglycemia, his Humulin R U500 will be lowered to 85 units with breakfast and lunch and 65 units with supper.  He can continue his Metformin 500 mg po twice daily with meals.  Will increase his Ozempic to 1 mg SQ weekly (may double up on his current 0.5 mg doses until he depletes his current supply).  Previously filled out PA program paperwork for patient but he says there was some kind of issue with his paperwork.  He has plans to contact the company to prevent delay in receiving his medications.  -He is encouraged to continue using his CGM to monitor glucose 4 times daily, before meals and before bed, and to call the clinic if he has readings less than 70 or greater than 300 for 3 tests in a row.  2) BP/HTN:  His blood pressure is controlled to target.  He is advised to continue Lasix 40 mg po daily, Losartan 50 mg po daily, Norvasc 5 mg po daily, and Metoprolol 75 mg po twice daily.  3) Lipids/HPL:  His most recent lipid panel from 08/05/19 shows uncontrolled LDL of 102 (improving).  He is advised to continue Pravastatin 40 mg po daily at bedtime.  Side effects and precautions discussed with him.    4)  Weight/Diet:  His Body mass index is 20.94 kg/m.-complicating his diabetes care.  He is a candidate for modest weight loss.  CDE consult  in progress, exercise, and carbohydrates information provided.  5) Hypothyroidism -His previsit thyroid function tests are consistent with appropriate hormone replacement.   He is advised to continue current dose of Levothyroxine at 137 mcg po daily before breakfast.     - We discussed about the correct intake of his thyroid hormone, on empty stomach at fasting, with water, separated by at least 30 minutes from breakfast and other medications,  and separated by more than 4 hours from calcium, iron, multivitamins, acid reflux medications (PPIs). -Patient is made aware of the fact that thyroid hormone replacement is needed for life, dose to be adjusted by periodic monitoring of thyroid function tests.  6) Chronic Care/Health  Maintenance: -Patient is on ACEI/ARB and Statin medications and encouraged to continue to follow up with Ophthalmology, Podiatrist at least yearly or according to recommendations, and advised to  stay away from smoking. I have recommended yearly flu vaccine and pneumonia vaccination at least every 5 years; moderate intensity exercise for up to 150 minutes weekly; and  sleep for at least 7 hours a day.  - I advised patient to maintain close follow up with Kathyrn Drown, MD for primary care needs.       I spent 46 minutes in the care of the patient today including review of labs from [facility], Lipids, Thyroid Function, Hematology (current and previous including abstractions from other facilities); face-to-face time discussing  his blood glucose readings/logs, discussing hypoglycemia and hyperglycemia episodes and symptoms, medications doses, his options of short and long term treatment based on the latest standards of care / guidelines;  discussion about incorporating lifestyle medicine;  and documenting the encounter.    Please refer to Patient Instructions for Blood Glucose Monitoring and Insulin/Medications Dosing Guide"  in media tab for additional information. Please  also  refer to " Patient Self Inventory" in the Media  tab for reviewed elements of pertinent patient history.  Paul Baker participated in the discussions, expressed understanding, and voiced agreement with the above plans.  All questions were answered to his satisfaction. he is encouraged to contact clinic should he have any questions or concerns prior to his return visit.   Follow up plan: -Return in about 3 months (around 06/07/2021) for Diabetes F/U with A1c in office, Thyroid follow up, No previsit labs, Bring meter and logs.  Rayetta Pigg, [facility] [address] [place], [place] [address] Phone: [phone] Fax: [phone]  03/07/2021, 6:13 PM

## 2021-03-08 ENCOUNTER — Telehealth: Payer: Self-pay

## 2021-03-08 NOTE — Telephone Encounter (Signed)
Received paperwork from [employer] Patient Assistance Program requesting insurance information be faxed to them at [phone]. I have received a confirmation that they received the faxed documentation. Placed in assistance folder. ?

## 2021-03-10 DIAGNOSIS — M179 Osteoarthritis of knee, unspecified: Secondary | ICD-10-CM | POA: Diagnosis not present

## 2021-03-10 DIAGNOSIS — G4733 Obstructive sleep apnea (adult) (pediatric): Secondary | ICD-10-CM | POA: Diagnosis not present

## 2021-03-11 ENCOUNTER — Emergency Department (HOSPITAL_COMMUNITY)
Admission: EM | Admit: 2021-03-11 | Discharge: 2021-03-11 | Disposition: A | Discharge status: Home / Self Care | Payer: Medicare HMO | Attending: Emergency Medicine | Admitting: Emergency Medicine

## 2021-03-11 ENCOUNTER — Emergency Department (HOSPITAL_COMMUNITY): Payer: Medicare HMO

## 2021-03-11 ENCOUNTER — Encounter: Payer: Self-pay | Admitting: Family Medicine

## 2021-03-11 ENCOUNTER — Other Ambulatory Visit: Payer: Self-pay

## 2021-03-11 ENCOUNTER — Encounter (HOSPITAL_COMMUNITY): Payer: Self-pay | Admitting: *Deleted

## 2021-03-11 ENCOUNTER — Ambulatory Visit (INDEPENDENT_AMBULATORY_CARE_PROVIDER_SITE_OTHER): Payer: Medicare HMO | Admitting: Family Medicine

## 2021-03-11 VITALS — BP 138/66 | HR 87 | Temp 97.9°F | Wt 219.4 lb

## 2021-03-11 DIAGNOSIS — R0789 Other chest pain: Secondary | ICD-10-CM | POA: Diagnosis not present

## 2021-03-11 DIAGNOSIS — R7989 Other specified abnormal findings of blood chemistry: Secondary | ICD-10-CM | POA: Insufficient documentation

## 2021-03-11 DIAGNOSIS — E119 Type 2 diabetes mellitus without complications: Secondary | ICD-10-CM | POA: Insufficient documentation

## 2021-03-11 DIAGNOSIS — Z794 Long term (current) use of insulin: Secondary | ICD-10-CM | POA: Insufficient documentation

## 2021-03-11 DIAGNOSIS — R079 Chest pain, unspecified: Secondary | ICD-10-CM | POA: Insufficient documentation

## 2021-03-11 DIAGNOSIS — I2510 Atherosclerotic heart disease of native coronary artery without angina pectoris: Secondary | ICD-10-CM | POA: Insufficient documentation

## 2021-03-11 DIAGNOSIS — K449 Diaphragmatic hernia without obstruction or gangrene: Secondary | ICD-10-CM | POA: Diagnosis not present

## 2021-03-11 DIAGNOSIS — Z7984 Long term (current) use of oral hypoglycemic drugs: Secondary | ICD-10-CM | POA: Insufficient documentation

## 2021-03-11 DIAGNOSIS — R1084 Generalized abdominal pain: Secondary | ICD-10-CM | POA: Diagnosis not present

## 2021-03-11 DIAGNOSIS — D649 Anemia, unspecified: Secondary | ICD-10-CM | POA: Insufficient documentation

## 2021-03-11 DIAGNOSIS — Z7901 Long term (current) use of anticoagulants: Secondary | ICD-10-CM | POA: Diagnosis not present

## 2021-03-11 DIAGNOSIS — I10 Essential (primary) hypertension: Secondary | ICD-10-CM | POA: Diagnosis not present

## 2021-03-11 DIAGNOSIS — Z79899 Other long term (current) drug therapy: Secondary | ICD-10-CM | POA: Insufficient documentation

## 2021-03-11 LAB — CBC
HCT: 37.8 % — ABNORMAL LOW (ref 39.0–52.0)
Hemoglobin: 12.1 g/dL — ABNORMAL LOW (ref 13.0–17.0)
MCH: 30 pg (ref 26.0–34.0)
MCHC: 32 g/dL (ref 30.0–36.0)
MCV: 93.6 fL (ref 80.0–100.0)
Platelets: 222 10*3/uL (ref 150–400)
RBC: 4.04 MIL/uL — ABNORMAL LOW (ref 4.22–5.81)
RDW: 13.5 % (ref 11.5–15.5)
WBC: 10 10*3/uL (ref 4.0–10.5)
nRBC: 0 % (ref 0.0–0.2)

## 2021-03-11 LAB — BASIC METABOLIC PANEL
Anion gap: 11 (ref 5–15)
BUN: 33 mg/dL — ABNORMAL HIGH (ref 8–23)
CO2: 25 mmol/L (ref 22–32)
Calcium: 8.9 mg/dL (ref 8.9–10.3)
Chloride: 101 mmol/L (ref 98–111)
Creatinine, Ser: 1.29 mg/dL — ABNORMAL HIGH (ref 0.61–1.24)
GFR, Estimated: >60 mL/min (ref >60)
Glucose, Bld: 269 mg/dL — ABNORMAL HIGH (ref 70–99)
Potassium: 3.8 mmol/L (ref 3.5–5.1)
Sodium: 137 mmol/L (ref 135–145)

## 2021-03-11 LAB — TROPONIN I (HIGH SENSITIVITY)
Troponin I (High Sensitivity): 4 ng/L (ref <18)
Troponin I (High Sensitivity): 4 ng/L (ref <18)

## 2021-03-11 LAB — LIPASE, BLOOD: Lipase: 42 U/L (ref 11–51)

## 2021-03-11 MED ORDER — ALUM & MAG HYDROXIDE-SIMETH 200-200-20 MG/5ML PO SUSP
30.0000 mL | Freq: Once | ORAL | Status: AC
  Administered 2021-03-11: 30 mL via ORAL
  Filled 2021-03-11: qty 30

## 2021-03-11 MED ORDER — LIDOCAINE VISCOUS HCL 2 % MT SOLN
15.0000 mL | Freq: Once | OROMUCOSAL | Status: AC
Start: 2021-03-11 — End: 2021-03-11
  Administered 2021-03-11: 15 mL via ORAL
  Filled 2021-03-11: qty 15

## 2021-03-11 MED ORDER — IOHEXOL 300 MG/ML  SOLN
100.0000 mL | Freq: Once | INTRAMUSCULAR | Status: AC | PRN
  Administered 2021-03-11: 100 mL via INTRAVENOUS

## 2021-03-11 MED ORDER — ONDANSETRON 4 MG PO TBDP: 4.0000 mg | ORAL_TABLET | Freq: Three times a day (TID) | ORAL | 0 refills | Status: DC | PRN

## 2021-03-11 NOTE — ED Provider Notes (Signed)
[facility] EMERGENCY DEPARTMENT Provider Note   CSN: [id number] Arrival date & time: 03/11/21  1451     History Chief Complaint  Patient presents with   Chest Pain    Paul Baker is a 71 y.o. male with a history of poorly controlled diabetes, hyperlipidemia, coronary disease, and hypertension who presents the emergency department with an abdominal pain that started yesterday.  Patient just got done mowing the lawn when he became very nauseous and had diffuse abdominal pain.  Shortly after, patient began having chest pain and shortness of breath.  This was persistent all night.  He followed up with his primary care doctor this morning who then sent him to the emergency department.  Patient still having mild chest pain primarily in the substernal region.  Also still having abdominal pain.  No urinary complaints.  No fever or chills.  Patient had a normal bowel movement this morning.   Chest Pain     Home Medications Prior to Admission medications   Medication Sig Start Date End Date Taking? Authorizing Provider  ondansetron (ZOFRAN-ODT) 4 MG disintegrating tablet Take 1 tablet (4 mg total) by mouth every 8 (eight) hours as needed for nausea or vomiting. 03/11/21  Yes Myna Bright M, PA-C  acetaminophen (TYLENOL) 325 MG tablet Take 2 tablets (650 mg total) by mouth every 6 (six) hours as needed for mild pain (or Fever >/= 101). 12/05/19   Roxan Hockey, MD  albuterol (PROVENTIL) (2.5 MG/3ML) 0.083% nebulizer solution Take 3 mLs (2.5 mg total) by nebulization every 6 (six) hours as needed for up to 7 days for wheezing or shortness of breath. 12/18/20   Marcello Fennel, PA-C  albuterol (VENTOLIN HFA) 108 (90 Base) MCG/ACT inhaler Inhale 2 puffs into the lungs every 6 (six) hours as needed for wheezing or shortness of breath. 06/03/20   Parrett, Fonnie Mu, NP  alfuzosin (UROXATRAL) 10 MG 24 hr tablet Take 10 mg by mouth daily. 03/02/21   [provider]  apixaban (ELIQUIS) 5  MG TABS tablet Take 1 tablet (5 mg total) by mouth 2 (two) times daily. For stroke prevention 02/21/21   Satira Sark, MD  Continuous Blood Gluc Receiver (DEXCOM G6 RECEIVER) [place] 1 Piece by Does not apply route as needed. 06/22/20   Brita Romp, NP  Continuous Blood Gluc Sensor (DEXCOM G6 SENSOR) MISC 4 Pieces by Does not apply route once a week. 05/15/18   Cassandria Anger, MD  ezetimibe (ZETIA) 10 MG tablet Take 1 tablet (10 mg total) by mouth daily. 01/28/21   Kathyrn Drown, MD  Fluticasone-Umeclidin-Vilant (TRELEGY ELLIPTA) 100-62.5-25 MCG/INH AEPB Inhale 1 puff into the lungs daily. 07/13/20   Chesley Mires, MD  furosemide (LASIX) 40 MG tablet Take by mouth. Take '40mg'$  daily alternating with '80mg'$  every other day. 09/09/20   [provider]  Glucagon (GVOKE HYPOPEN 2-PACK) 1 MG/0.2ML SOAJ Inject 1 mg subcutaneously once as needed for low blood sugar. May repeat dose in 15 minutes as needed using a new device. 01/28/21   Kathyrn Drown, MD  Insulin Pen Needle (B-D ULTRAFINE III SHORT PEN) 31G X 8 MM MISC USE 1 PEN NEEDLE THREE TIMES DAILY. 01/11/21   Brita Romp, NP  insulin regular human CONCENTRATED (HUMULIN R U-500 KWIKPEN) 500 UNIT/ML KwikPen Inject 65-85 Units into the skin 3 (three) times daily with meals. Inject 85 units with breakfast and lunch, and 65 units with supper if glucose is above 90 and you are eating  03/07/21   Brita Romp, NP  levothyroxine (SYNTHROID) 137 MCG tablet TAKE 1 TABLET BY MOUTH ONCE DAILY BEFORE BREAKFAST 02/21/21   Brita Romp, NP  loratadine (CLARITIN) 10 MG tablet Take 10 mg by mouth daily as needed for allergies.     [provider]  losartan (COZAAR) 100 MG tablet Take 100 mg by mouth daily. 11/12/20   [provider]  metFORMIN (GLUCOPHAGE) 500 MG tablet TAKE 1 TABLET BY MOUTH TWICE DAILY WITH MEALS 01/11/21   Brita Romp, NP  metoprolol succinate (TOPROL XL) 25 MG 24 hr tablet Take 1 tablet (25 mg  total) by mouth See admin instructions. -Take 1 tablet (25 mg) along with an additional 50 mg tablet for total of 75 mg twice daily 10/25/20 10/25/21  Satira Sark, MD  metoprolol succinate (TOPROL-XL) 50 MG 24 hr tablet Take 1 tablet (50 mg total) by mouth 2 (two) times daily. -Take 1 tablet (50 mg) along with an additional 25 mg tablet for total of 75 mg twice daily 02/21/21   Satira Sark, MD  modafinil (PROVIGIL) 200 MG tablet Take 1 tablet (200 mg total) by mouth daily. 12/01/20   Lomax, Amy, NP  MYRBETRIQ 50 MG TB24 tablet Take 50 mg by mouth daily. 11/22/20   [provider]  nitroGLYCERIN (NITROSTAT) 0.4 MG SL tablet DISSOLVE 1 TABLET UNDER TONGUE EVERY 5 MINUTES UP TO 15 MIN FOR CHESTPAIN. IF NO RELIEF CALL 911. 04/05/20   Satira Sark, MD  ONE TOUCH ULTRA TEST test strip TEST BLOOD SUGAR UP TO 4 TIMES DAILY. 10/10/16   Cassandria Anger, MD  ONETOUCH DELICA LANCETS 23J MISC USE AS DIRECTED TO TEST BLOOD SUGAR 4 TIMES DAILY. 10/10/16   Cassandria Anger, MD  pravastatin (PRAVACHOL) 40 MG tablet TAKE 1 TABLET BY MOUTH ONCE DAILY IN THE EVENING 10/25/20   Satira Sark, MD  rOPINIRole (REQUIP XL) 4 MG 24 hr tablet Take 1 tablet (4 mg total) by mouth at bedtime. 12/01/20   Lomax, Amy, NP  rOPINIRole (REQUIP) 4 MG tablet Take 0.5 tablets (2 mg total) by mouth 2 (two) times daily. 12/01/20   Lomax, Amy, NP  Semaglutide, 1 MG/DOSE, 4 MG/3ML SOPN Inject 1 mg as directed once a week. 03/07/21   Brita Romp, NP      Allergies    Tape, Cardizem [diltiazem hcl], Cardura [doxazosin mesylate], Crestor [rosuvastatin], Lipitor [atorvastatin], Paxil [paroxetine hcl], Codeine, and Gabapentin    Review of Systems   Review of Systems  Cardiovascular:  Positive for chest pain.  All other systems reviewed and are negative.  Physical Exam Updated Vital Signs BP 140/70    Pulse 83    Temp 98.5 F (36.9 C) (Oral)    Resp 17    Ht '5\' 4"'$  (1.626 m)    Wt 99.3 kg    SpO2  95%    BMI 37.59 kg/m  Physical Exam Vitals and nursing note reviewed.  Constitutional:      General: He is not in acute distress.    Appearance: Normal appearance.  HENT:     Head: Normocephalic and atraumatic.  Eyes:     General:        Right eye: No discharge.        Left eye: No discharge.  Cardiovascular:     Comments: Regular rate and rhythm.  S1/S2 are distinct without any evidence of murmur, rubs, or gallops.  Radial pulses are 2+ bilaterally.  Dorsalis pedis pulses are 2+ bilaterally.  No evidence of pedal edema. Pulmonary:     Comments: Clear to auscultation bilaterally.  Normal effort.  No respiratory distress.  No evidence of wheezes, rales, or rhonchi heard throughout. Abdominal:     General: Abdomen is flat. Bowel sounds are normal. There is no distension.     Tenderness: There is generalized abdominal tenderness and tenderness in the right lower quadrant. There is no guarding or rebound.     Comments: Distended abdomen  Musculoskeletal:        General: Normal range of motion.     Cervical back: Neck supple.  Skin:    General: Skin is warm and dry.     Findings: No rash.  Neurological:     General: No focal deficit present.     Mental Status: He is alert.  Psychiatric:        Mood and Affect: Mood normal.        Behavior: Behavior normal.    ED Results / Procedures / Treatments   Labs (all labs ordered are listed, but only abnormal results are displayed) Labs Reviewed  BASIC METABOLIC PANEL - Abnormal; Notable for the following components:      Result Value   Glucose, Bld 269 (*)    BUN 33 (*)    Creatinine, Ser 1.29 (*)    All other components within normal limits  CBC - Abnormal; Notable for the following components:   RBC 4.04 (*)    Hemoglobin 12.1 (*)    HCT 37.8 (*)    All other components within normal limits  LIPASE, BLOOD  TROPONIN I (HIGH SENSITIVITY)  TROPONIN I (HIGH SENSITIVITY)    EKG EKG Interpretation  Date/Time:  Friday March 11 2021 15:14:52 EST Ventricular Rate:  84 PR Interval:  167 QRS Duration: 102 QT Interval:  389 QTC Calculation: 460 R Axis:   -11 Text Interpretation: Sinus rhythm Low voltage, precordial leads No significant change since last tracing Confirmed by Dorie Rank [phone]) on 03/11/2021 6:27:49 PM  Radiology DG Chest 2 View  Result Date: 03/11/2021 CLINICAL DATA:  chest pain EXAM: CHEST - 2 VIEW COMPARISON:  Chest x-ray 12/18/2020, CT chest 05/23/2020 FINDINGS: The heart and mediastinal contours are within normal limits. No focal consolidation. No pulmonary edema. No pleural effusion. No pneumothorax. No acute osseous abnormality. Multilevel degenerative changes of the thoracic spine. IMPRESSION: No active cardiopulmonary disease. Electronically Signed   By: Iven Finn M.D.   On: 03/11/2021 15:42   CT ABDOMEN PELVIS W CONTRAST  Result Date: 03/11/2021 CLINICAL DATA:  Pain right lower quadrant EXAM: CT ABDOMEN AND PELVIS WITH CONTRAST TECHNIQUE: Multidetector CT imaging of the abdomen and pelvis was performed using the standard protocol following bolus administration of intravenous contrast. RADIATION DOSE REDUCTION: This exam was performed according to the departmental dose-optimization program which includes automated exposure control, adjustment of the mA and/or kV according to patient size and/or use of iterative reconstruction technique. CONTRAST:  110m OMNIPAQUE IOHEXOL 300 MG/ML  SOLN COMPARISON:  06/05/2013 FINDINGS: Lower chest: Unremarkable. Hepatobiliary: No focal abnormality is seen in the liver. There is fatty infiltration in the liver. Gallbladder is not distended. There is subtle increased density in the neck of the gallbladder. Pancreas: No focal abnormality is seen. Spleen: Unremarkable. Adrenals/Urinary Tract: Adrenals are not enlarged. There is no hydronephrosis. There are no renal or ureteral stones. Urinary bladder is distended. Stomach/Bowel: Small hiatal hernia is seen. Small bowel  loops are not dilated. Appendix  is noted in the subhepatic location and not distended. There is no significant wall thickening in colon. There is no pericolic stranding. Vascular/Lymphatic: There are scattered atherosclerotic plaques and calcifications in the aorta and its major branches. Reproductive: There are coarse calcifications in the enlarged prostate. Other: There is no ascites or pneumoperitoneum. There is stranding in the subcutaneous fat in the mid abdomen on both sides. Significance of this finding is not clear. There is no adjacent loculated fluid collection or pockets of air. Bilateral inguinal hernias containing fat are seen, larger on the left side. Musculoskeletal: Spinal stenosis and encroachment of neural foramina is seen at multiple levels more prominent at L4-L5 and L5-S1 levels. IMPRESSION: There is no evidence of intestinal obstruction or pneumoperitoneum. There is no hydronephrosis. Appendix is not dilated. There is subtle increased density in the neck of the gallbladder which may suggest partial volume averaging artifact or sludge or stones. Small hiatal hernia. Lumbar spondylosis. Other findings as described in the body of the report. Electronically Signed   By: Elmer Picker M.D.   On: 03/11/2021 18:16    Procedures Procedures    Medications Ordered in ED Medications  iohexol (OMNIPAQUE) 300 MG/ML solution 100 mL (100 mLs Intravenous Contrast Given 03/11/21 1737)  alum & mag hydroxide-simeth (MAALOX/MYLANTA) 200-200-20 MG/5ML suspension 30 mL (30 mLs Oral Given 03/11/21 1838)    And  lidocaine (XYLOCAINE) 2 % viscous mouth solution 15 mL (15 mLs Oral Given 03/11/21 1838)    ED Course/ Medical Decision Making/ A&P Clinical Course as of 03/11/21 1844  Fri Mar 11, 2021  1839 I discussed this case with my attending physician who cosigned this note including patient's presenting symptoms, physical exam, and planned diagnostics and interventions. Attending physician stated  agreement with plan or made changes to plan which were implemented.    [CF]    Clinical Course User Index [CF] Hendricks Limes, PA-C                           Medical Decision Making Amount and/or Complexity of Data Reviewed Labs: ordered. Radiology: ordered.  Risk OTC drugs. Prescription drug management.   This patient presents to the ED for concern of chest pain and abdominal pain, this involves an extensive number of treatment options, and is a complaint that carries with it a high risk of complications and morbidity.  The differential diagnosis includes ACS, dysrhythmia.  No clinical signs of volume overload.  I doubt heart failure at this time.  With regards to his abdominal pain I am concerned for possible appendicitis.  He is distended and slightly taut.  It is difficult to say whether or not this is from his body habitus or not.  Could also be kidney stone, diverticulitis, hepatobiliary disease.   Co morbidities that complicate the patient evaluation  Diabetes Coronary artery disease Hypertension Hyperlipidemia Atrial fibrillation   Additional history obtained:  Additional history obtained from old records and nursing note. External records from outside source obtained and reviewed including office visit from today which showed that he was having abdominal pain and had some rigidity on exam which prompted his arrival today.   Lab Tests:  I Ordered, and personally interpreted labs.  The pertinent results include: CBC showed chronic ongoing anemia.  No leukocytosis.  CMP showed elevated glucose and elevated creatinine which seems to be hovering around his baseline somewhat.  Initial and delta troponin were negative.  I doubt ACS or NSTEMI.  Lipase is negative.  I doubt pancreatitis.   Imaging Studies ordered:  I ordered imaging studies including CT abdomen pelvis with contrast and chest x-ray I independently visualized and interpreted imaging which showed no  evidence of pneumonia or pleural effusions.  CT abdomen pelvis did not show any signs of bowel obstruction or perforation. I agree with the radiologist interpretation   Cardiac Monitoring:  The patient was maintained on a cardiac monitor.  I personally viewed and interpreted the cardiac monitored which showed an underlying rhythm of: Normal sinus rhythm   Medicines ordered and prescription drug management:  I ordered medication including GI cocktail for upper abdominal pain Reevaluation of the patient after these medicines showed that the patient improved I have reviewed the patients home medicines and have made adjustments as needed   Problem List / ED Course:  Chest pain.  Given the patient's complicated medical history there was a concern early on for possible NSTEMI or ACS.  Hear score was 5.  Initial EKG was normal without any signs of active ischemia or dysrhythmias.  Patient continues to have chest pain intermittently but mainly complaining of upper abdominal pain which she describes as a cramping sensation.  Given his work-up, I do believe he is safe for close outpatient follow-up with his cardiology with respect to his chest pain.  In regards to his abdominal pain patient does have a history of pancreatitis and lipase was negative today which is reassuring.  Rest of his work-up was also reassuring.  CT abdomen did not show any acute abnormalities which is also good news.  I discussed all the findings with him and his wife at the bedside.  I will trial GI cocktail for his abdominal pain and plan to discharge him home. I will give him zofran to go home with.    Reevaluation:  After the interventions noted above, I reevaluated the patient and found that they have :improved   Dispostion:  After consideration of the diagnostic results and the patients response to treatment, I feel that the patent would benefit from close outpatient follow-up with his cardiologist and primary care  provider.  Final Clinical Impression(s) / ED Diagnoses Final diagnoses:  Chest pain, unspecified type  Generalized abdominal pain    Rx / DC Orders ED Discharge Orders          Ordered    ondansetron (ZOFRAN-ODT) 4 MG disintegrating tablet  Every 8 hours PRN        03/11/21 1844              Hendricks Limes, PA-C 03/11/21 1844    Dorie Rank, MD 03/12/21 1620

## 2021-03-11 NOTE — Assessment & Plan Note (Signed)
Patient reporting recent chest pain, shortness of breath as well as abdominal pain and dizziness. ?EKG was obtained today and was independently interpreted by me.  Interpretation: Normal sinus rhythm at the rate of 86.  No apparent ST or T wave changes. ?Given his past medical history and recent development of the above symptoms, he needs stat evaluation with labs and serial troponins.  I have spoken with the ER physician about his case.  He is going directly to the ER for further evaluation. ?

## 2021-03-11 NOTE — Progress Notes (Signed)
? ?Subjective:  ?Patient ID: Paul Baker, male    DOB: 05-13-1950  Age: 71 y.o. MRN: [id number] ? ?CC: ?Chief Complaint  ?Patient presents with  ? Cough  ?  Cough, shortness of breath, abd pain from hernia. Hx of asthma and DM. Blood sugars elevated   ? ? ?HPI: ? ?71 year old male with type 2 diabetes which is uncontrolled, paroxysmal A-fib, hypertension, coronary disease presents with multiple complaints. ? ?Patient reports that his symptoms started yesterday.  He has had significant nausea and dizziness.  He has had some headache.  He states that he had significant abdominal pain yesterday particularly after he bent down.  He states that he has a midline hernia.  He also reports recent chest pain.  He is unsure if this is different from his normal angina.  He states that this all started after he was mowing grass.  Blood sugars have been elevated.  He is followed closely by endocrinology.  No fever.  Also reports some associated shortness of breath although he is not short of breath at this time. ? ?Patient Active Problem List  ? Diagnosis Date Noted  ? Grade I diastolic dysfunction 21/22/4825  ? Type 2 diabetes mellitus ([place])   ? Paroxysmal atrial fibrillation ([facility])   ? Hypothyroidism   ? Asthma 08/05/2019  ? Class 2 obesity 08/05/2019  ? Mixed hyperlipidemia 11/25/2014  ? Other specified hypothyroidism 11/25/2014  ? S/P right TKA 03/23/2014  ? S/P knee replacement 03/23/2014  ? Spinal stenosis of cervicothoracic region 10/08/2013  ? RLS (restless legs syndrome) 10/08/2013  ? DM type 2 causing vascular disease ([place]) 10/08/2013  ? 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/02/2013  ? Diabetic neuropathy ([place]) 07/23/2012  ? Obesity, morbid ([place]) 05/01/2012  ? OSA on CPAP 04/19/2012  ? Chest pain 04/19/2012  ? Essential hypertension, benign 12/16/2009  ? CORONARY ATHEROSCLEROSIS NATIVE CORONARY ARTERY 12/16/2009  ? Hyperglycemia due to type 2 diabetes mellitus ([place]) 11/30/2009  ? ? ?Social Hx   ?Social  History  ? ?Socioeconomic History  ? Marital status: Married  ?  Spouse name: Toney Reil  ? Number of children: 2  ? Years of education: college  ? Highest education level: Not on file  ?Occupational History  ? Occupation: Disabled  ?  Employer: UNEMPLOYED  ?Tobacco Use  ? Smoking status: Former  ?  Packs/day: 1.50  ?  Years: 10.00  ?  Pack years: 15.00  ?  Types: Cigarettes  ?  Start date: 06/19/1967  ?  Quit date: 01/03/1995  ?  Years since quitting: 26.2  ? Smokeless tobacco: Never  ?Vaping Use  ? Vaping Use: Never used  ?Substance and Sexual Activity  ? Alcohol use: No  ?  Alcohol/week: 0.0 standard drinks  ?  Comment: quit drinking in 07/86  ? Drug use: No  ? Sexual activity: Yes  ?  Partners: Female  ?Other Topics Concern  ? Not on file  ?Social History Narrative  ?  71 year old, right-handed, caucasian male with a past medical history of obesity, hypertension, hyperlipidemia, diabetes, obstructive sleep apnea, presenting with frequent nighttime awakenings, excessive daytime sleepiness, also transient confusional episodes.RLS and one beosity, OSA on CPAP with AHI of 3.2 and  setting of 16 cm water , [facility] .  ? Married since 1977.  ? ?Social Determinants of Health  ? ?Financial Resource Strain: Low Risk   ? Difficulty of Paying Living Expenses: Not hard at all  ?Food Insecurity: No Food Insecurity  ? Worried  About Running Out of Food in the Last Year: Never true  ? Ran Out of Food in the Last Year: Never true  ?Transportation Needs: No Transportation Needs  ? Lack of Transportation (Medical): No  ? Lack of Transportation (Non-Medical): No  ?Physical Activity: Sufficiently Active  ? Days of Exercise per Week: 3 days  ? Minutes of Exercise per Session: 60 min  ?Stress: No Stress Concern Present  ? Feeling of Stress : Only a little  ?Social Connections: Socially Integrated  ? Frequency of Communication with Friends and Family: More than three times a week  ? Frequency of Social Gatherings with Friends and  Family: More than three times a week  ? Attends Religious Services: More than 4 times per year  ? Active Member of Clubs or Organizations: Yes  ? Attends Archivist Meetings: More than 4 times per year  ? Marital Status: Married  ? ? ?Review of Systems ?Per HPI ? ?Objective:  ?BP 138/66   Pulse 87   Temp 97.9 ?F (36.6 ?C)   Wt 219 lb 6.4 oz (99.5 kg)   SpO2 96%   BMI 36.51 kg/m?  ? ?BP/Weight 03/11/2021 03/07/2021 01/18/2021  ?Systolic BP 672 094 709  ?Diastolic BP 66 68 84  ?Wt. (Lbs) 219.4 222.2 218.4  ?BMI 36.51 36.98 36.34  ? ? ?Physical Exam ?Vitals and nursing note reviewed.  ?Constitutional:   ?   General: He is not in acute distress. ?   Appearance: Normal appearance.  ?HENT:  ?   Head: Normocephalic and atraumatic.  ?Cardiovascular:  ?   Rate and Rhythm: Normal rate and regular rhythm.  ?Pulmonary:  ?   Effort: Pulmonary effort is normal.  ?   Breath sounds: Normal breath sounds. No wheezing or rales.  ?Abdominal:  ?   Comments: Abdomen is firm. ?Suspected hernia around the umbilicus.  I believe there is a component of diastases recti as well.  ?Neurological:  ?   Mental Status: He is alert.  ?Psychiatric:     ?   Mood and Affect: Mood normal.     ?   Behavior: Behavior normal.  ? ? ?Lab Results  ?Component Value Date  ? WBC 6.4 12/18/2020  ? HGB 12.8 (L) 12/18/2020  ? HCT 39.6 12/18/2020  ? PLT 206 12/18/2020  ? GLUCOSE 324 (H) 12/18/2020  ? CHOL 177 08/05/2019  ? TRIG 95 08/05/2019  ? HDL 56 08/05/2019  ? LDLCALC 102 (H) 08/05/2019  ? ALT 23 05/23/2020  ? AST 23 05/23/2020  ? NA 136 12/18/2020  ? K 4.1 12/18/2020  ? CL 99 12/18/2020  ? CREATININE 1.13 12/18/2020  ? BUN 32 (H) 12/18/2020  ? CO2 26 12/18/2020  ? TSH 0.812 02/23/2021  ? PSA 2.03 11/13/2013  ? INR 0.9 08/05/2019  ? HGBA1C 8.8 (A) 03/07/2021  ? MICROALBUR 9.5 02/06/2019  ? ? ? ?Assessment & Plan:  ? ?Problem List Items Addressed This Visit   ? ?  ? Other  ? Chest pain - Primary  ?  Patient reporting recent chest pain, shortness of  breath as well as abdominal pain and dizziness. ?EKG was obtained today and was independently interpreted by me.  Interpretation: Normal sinus rhythm at the rate of 86.  No apparent ST or T wave changes. ?Given his past medical history and recent development of the above symptoms, he needs stat evaluation with labs and serial troponins.  I have spoken with the ER physician about his case.  He  is going directly to the ER for further evaluation. ?  ?  ? Relevant Orders  ? EKG 12-Lead (Completed)  ? ?Thersa Salt DO ?[place] ? ?

## 2021-03-11 NOTE — Discharge Instructions (Signed)
Please follow-up with your cardiologist to see if you need another stress test.  I would like for you to also follow-up with your primary care doctor for further evaluation.  Please return to the emergency department for any worsening symptoms.  Please take Zofran as needed for nausea. ?

## 2021-03-11 NOTE — ED Triage Notes (Signed)
Chest pain onset yesterday 

## 2021-03-14 DIAGNOSIS — E1110 Type 2 diabetes mellitus with ketoacidosis without coma: Secondary | ICD-10-CM | POA: Diagnosis not present

## 2021-03-14 DIAGNOSIS — G4733 Obstructive sleep apnea (adult) (pediatric): Secondary | ICD-10-CM | POA: Diagnosis not present

## 2021-03-15 DIAGNOSIS — N401 Enlarged prostate with lower urinary tract symptoms: Secondary | ICD-10-CM | POA: Diagnosis not present

## 2021-03-15 DIAGNOSIS — R3914 Feeling of incomplete bladder emptying: Secondary | ICD-10-CM | POA: Diagnosis not present

## 2021-03-15 DIAGNOSIS — R3915 Urgency of urination: Secondary | ICD-10-CM | POA: Diagnosis not present

## 2021-03-18 ENCOUNTER — Ambulatory Visit (INDEPENDENT_AMBULATORY_CARE_PROVIDER_SITE_OTHER): Payer: Medicare HMO | Admitting: Pharmacist

## 2021-03-18 DIAGNOSIS — I10 Essential (primary) hypertension: Secondary | ICD-10-CM

## 2021-03-18 DIAGNOSIS — I25119 Atherosclerotic heart disease of native coronary artery with unspecified angina pectoris: Secondary | ICD-10-CM

## 2021-03-18 DIAGNOSIS — Z794 Long term (current) use of insulin: Secondary | ICD-10-CM

## 2021-03-18 DIAGNOSIS — J4531 Mild persistent asthma with (acute) exacerbation: Secondary | ICD-10-CM

## 2021-03-18 DIAGNOSIS — Z8673 Personal history of transient ischemic attack (TIA), and cerebral infarction without residual deficits: Secondary | ICD-10-CM

## 2021-03-18 DIAGNOSIS — E782 Mixed hyperlipidemia: Secondary | ICD-10-CM

## 2021-03-18 NOTE — Chronic Care Management (AMB) (Signed)
? ? ?Chronic Care Management ?Pharmacy Note ? ?03/18/2021 ?Name:  Paul Baker MRN:  [id number] DOB:  08-04-1950 ? ?Summary: ?Type 2 Diabetes ?Managed by endocrinology Rayetta Pigg, NP) ?Uncontrolled but improving; Most recent A1c improved to 8.8% but remains above goal of <7% per ADA guidelines ?Current medications: metformin 500 mg by mouth twice daily, semaglutide (Ozempic) 1 mg subcutaneously weekly, and Humulin R (U-500) 85 units subcutaneously before breakfast, 85 units subcutaneously before lunch, and 65 units subcutaneously before dinner ?Emergency hypoglycemia treatment:  Patient has Gvoke on hand if needed  ?Continue current medications as above per endocrinology ?Use glucagon (Gvoke HypoPen) 1 mg subcutaneously as needed for hypoglycemia. May repeat dose in 15 minutes as needed. ?Instructed to monitor blood sugars continuously with continuous glucose monitor  ?Continue follow-up with endocrinology ? ?Hyperlipidemia/Coronary artery disease/History of stoke ?Followed by cardiology (Dr. Domenic Polite) and neurology (Debbora Presto, NP) ?LDL above goal of <55 due to extreme risk given established clinical ASCVD + diabetes per 2023 ADA Standards of Care in Diabetes. ?Current medications: pravastatin 40 mg by mouth once daily and ezetimibe 10 mg by mouth once daily ?Continue pravastatin 40 mg by mouth once daily and ezetimibe 10 mg by mouth daily ?Patient reminded to come get re-check lipid panel soon. Fasting lipid panel ordered at last visit. ?Continue follow-up with cardiology/neurology ? ?Overweight/Obesity ?Baseline weight: 240 lbs; most recent weight: 219 lbs ?Continue semaglutide (Ozempic) 1 mg subcutaneously weekly ? ?Financial: ?Patient reports cost concerns with several brand name medications. The following information was provided/discussed with the patient at initial visit. ?Patient instructed to call [employer] [place] patient assistance program [phone]) ?Patient instructed he is eligible to  renew Eliquis patient assistance program when he has spent at least 3% (~$1,000) of his gross annual income at the pharmacy ?Patient instructed he is eligible for the Trelegy patient assistance program through [employer] once has has spent at least $600 at the pharmacy in a calendar year ?Ozempic patient assistance program application in process with assistance from endocrinology ? ?Subjective: ?Paul Baker is an 71 y.o. year old male who is a primary patient of Luking, Elayne Snare, MD.  The CCM team was consulted for assistance with disease management and care coordination needs.   ? ?Engaged with patient by telephone for follow up visit in response to provider referral for pharmacy case management and/or care coordination services.  ? ?Consent to Services:  ?The patient was given information about Chronic Care Management services, agreed to services, and gave verbal consent prior to initiation of services.  Please see initial visit note for detailed documentation.  ? ?Patient Care Team: ?Kathyrn Drown, MD as PCP - General (Family Medicine) ?Satira Sark, MD as PCP - Cardiology (Cardiology) ?Mcarthur Rossetti, MD (Orthopedic Surgery) ?Beryle Lathe, [facility] (Pharmacist) ? ?Objective: ? ?Lab Results  ?Component Value Date  ? CREATININE 1.29 (H) 03/11/2021  ? CREATININE 1.13 12/18/2020  ? CREATININE 1.0 11/12/2020  ? ? ?Lab Results  ?Component Value Date  ? HGBA1C 8.8 (A) 03/07/2021  ? ?Last diabetic Eye exam:  ?Lab Results  ?Component Value Date/Time  ? HMDIABEYEEXA No Retinopathy 12/30/2020 12:00 AM  ?  ?Last diabetic Foot exam: No results found for: HMDIABFOOTEX  ? ?   ?Component Value Date/Time  ? CHOL 177 08/05/2019 0334  ? CHOL 148 08/19/2015 0914  ? TRIG 95 08/05/2019 0334  ? HDL 56 08/05/2019 0334  ? HDL 53 08/19/2015 0914  ? CHOLHDL 3.2 08/05/2019 0334  ? VLDL 19 08/05/2019  0334  ? LDLCALC 102 (H) 08/05/2019 0334  ? [place] 118 (H) 02/06/2019 0813  ? ? ?Hepatic Function Latest Ref Rng & Units  11/12/2020 05/23/2020 03/10/2020  ?Total Protein 6.5 - 8.1 g/dL - 6.9 6.6  ?Albumin 3.5 - 5.0 4.3 3.8 4.3  ?AST 15 - 41 U/L - 23 16  ?ALT 0 - 44 U/L - 23 14  ?Alk Phosphatase 38 - 126 U/L - 50 75  ?Total Bilirubin 0.3 - 1.2 mg/dL - 0.4 0.3  ?Bilirubin, Direct 0.00 - 0.40 mg/dL - - -  ? ? ?Lab Results  ?Component Value Date/Time  ? TSH 0.812 02/23/2021 09:33 AM  ? TSH 1.310 03/10/2020 08:05 AM  ? FREET4 1.62 02/23/2021 09:33 AM  ? FREET4 1.57 03/10/2020 08:05 AM  ? ? ?CBC Latest Ref Rng & Units 03/11/2021 12/18/2020 11/12/2020  ?WBC 4.0 - 10.5 K/uL 10.0 6.4 -  ?Hemoglobin 13.0 - 17.0 g/dL 12.1(L) 12.8(L) 12.9(A)  ?Hematocrit 39.0 - 52.0 % 37.8(L) 39.6 -  ?Platelets 150 - 400 K/uL 222 206 -  ? ? ?Lab Results  ?Component Value Date/Time  ? VD25OH 70 02/06/2019 08:13 AM  ? ? ?Clinical ASCVD: No  ?The 10-year ASCVD risk score (Arnett DK, et al., 2019) is: 35.2% ?  Values used to calculate the score: ?    Age: 71 years ?    Sex: Male ?    Is Non-Hispanic African American: No ?    Diabetic: Yes ?    Tobacco smoker: No ?    Systolic Blood Pressure: 941 mmHg ?    Is BP treated: Yes ?    HDL Cholesterol: 56 mg/dL ?    Total Cholesterol: 177 mg/dL   ? ?Social History  ? ?Tobacco Use  ?Smoking Status Former  ? Packs/day: 1.50  ? Years: 10.00  ? Pack years: 15.00  ? Types: Cigarettes  ? Start date: 06/19/1967  ? Quit date: 01/03/1995  ? Years since quitting: 26.2  ?Smokeless Tobacco Never  ? ?BP Readings from Last 3 Encounters:  ?03/11/21 140/70  ?03/11/21 138/66  ?03/07/21 (!) 142/68  ? ?Pulse Readings from Last 3 Encounters:  ?03/11/21 83  ?03/11/21 87  ?03/07/21 86  ? ?Wt Readings from Last 3 Encounters:  ?03/11/21 219 lb (99.3 kg)  ?03/11/21 219 lb 6.4 oz (99.5 kg)  ?03/07/21 222 lb 3.2 oz (100.8 kg)  ? ? ?Assessment: Review of patient past medical history, allergies, medications, health status, including review of consultants reports, laboratory and other test data, was performed as part of comprehensive evaluation and provision  of chronic care management services.  ? ?SDOH:  (Social Determinants of Health) assessments and interventions performed:  ? ? ?CCM Care Plan ? ?Allergies  ?Allergen Reactions  ? Tape Rash  ?  Pulls off skin  ? Cardizem [Diltiazem Hcl]   ?  Edema   ? Cardura [Doxazosin Mesylate]   ?  Headaches / cramps  ? Crestor [Rosuvastatin] Other (See Comments)  ?  Leg cramps  ? Lipitor [Atorvastatin] Other (See Comments)  ?  Leg cramps  ? Paxil [Paroxetine Hcl]   ?  Unknown reaction   ? Codeine Rash and Other (See Comments)  ?  Headache   ? Gabapentin Rash  ? ? ?Medications Reviewed Today   ? ? Reviewed by Beryle Lathe, [facility] (Pharmacist) on 03/18/21 at 1408  Med List Status: <None>  ? ?Medication Order Taking? Sig Documenting Provider Last Dose Status Informant  ?acetaminophen (TYLENOL) 325 MG tablet [id number] Yes  Take 2 tablets (650 mg total) by mouth every 6 (six) hours as needed for mild pain (or Fever >/= 101). Roxan Hockey, MD Taking Active   ?albuterol (PROVENTIL) (2.5 MG/3ML) 0.083% nebulizer solution [id number] Yes Take 3 mLs (2.5 mg total) by nebulization every 6 (six) hours as needed for up to 7 days for wheezing or shortness of breath. Marcello Fennel, PA-C Taking Active   ?albuterol (VENTOLIN HFA) 108 (90 Base) MCG/ACT inhaler [id number] Yes Inhale 2 puffs into the lungs every 6 (six) hours as needed for wheezing or shortness of breath. Parrett, Fonnie Mu, NP Taking Active   ?alfuzosin (UROXATRAL) 10 MG 24 hr tablet [id number] Yes Take 10 mg by mouth daily. [provider] Taking Active   ?apixaban (ELIQUIS) 5 MG TABS tablet [id number] Yes Take 1 tablet (5 mg total) by mouth 2 (two) times daily. For stroke prevention Satira Sark, MD Taking Active   ?Continuous Blood Gluc Receiver (Rudy) DEVI [id number] Yes 1 Piece by Does not apply route as needed. Brita Romp, NP Taking Active   ?Continuous Blood Gluc Sensor (DEXCOM G6 SENSOR) MISC [id number] Yes 4 Pieces by Does not  apply route once a week. Cassandria Anger, MD Taking Active Self  ?ezetimibe (ZETIA) 10 MG tablet [id number] Yes Take 1 tablet (10 mg total) by mouth daily. Kathyrn Drown, MD Taking Active   ?Flutic

## 2021-03-18 NOTE — Patient Instructions (Signed)
Paul Baker, ? ?It was great to talk to you today! ? ?Please call me with any questions or concerns. ? ?Visit Information ? ?Following are the goals we discussed today:  ? Goals Addressed   ? ?  ?  ?  ?  ? This Visit's Progress  ?  Medication Management     ?  Patient Goals/Self-Care Activities ?Patient will:  ?Take medications as prescribed ?Check blood sugar continuously with continuous glucose monitor, document, and provide at future appointments ?Check blood pressure at least once daily, document, and provide at future appointments ?Collaborate with provider on medication access solutions ?Engage in dietary modifications by fewer sweetened foods & beverages ? ? ? ?  ? ?  ?  ? ?Follow-up plan: Follow-up with all specialists as scheduled ?Future Appointments  ?Date Time Provider [place]  ?06/01/2021  1:30 PM Lomax, Amy, NP [facility] None  ?06/09/2021 10:00 AM Brita Romp, NP [facility] None  ?06/28/2021 11:40 AM Satira Sark, MD [facility]  ?01/24/2022 10:20 AM [facility]-NURSE HEALTH ADVISOR [facility] [place]  ? ? ?Patient verbalizes understanding of instructions and care plan provided today and agrees to view in [place]. Active MyChart status confirmed with patient.   ? ?Please call the care guide team at [phone] if you need to cancel or reschedule your appointment.  ? ?Kennon Holter, PharmD, BCACP, CPP ?Clinical Pharmacist Practitioner ?[place] ?[phone]  ?

## 2021-03-20 ENCOUNTER — Other Ambulatory Visit: Payer: Self-pay | Admitting: Nurse Practitioner

## 2021-03-21 ENCOUNTER — Other Ambulatory Visit: Payer: Self-pay | Admitting: Pulmonary Disease

## 2021-04-01 DIAGNOSIS — J4531 Mild persistent asthma with (acute) exacerbation: Secondary | ICD-10-CM

## 2021-04-01 DIAGNOSIS — E782 Mixed hyperlipidemia: Secondary | ICD-10-CM

## 2021-04-01 DIAGNOSIS — I25119 Atherosclerotic heart disease of native coronary artery with unspecified angina pectoris: Secondary | ICD-10-CM | POA: Diagnosis not present

## 2021-04-01 DIAGNOSIS — Z794 Long term (current) use of insulin: Secondary | ICD-10-CM | POA: Diagnosis not present

## 2021-04-01 DIAGNOSIS — I10 Essential (primary) hypertension: Secondary | ICD-10-CM | POA: Diagnosis not present

## 2021-04-01 DIAGNOSIS — E11649 Type 2 diabetes mellitus with hypoglycemia without coma: Secondary | ICD-10-CM

## 2021-04-05 DIAGNOSIS — E782 Mixed hyperlipidemia: Secondary | ICD-10-CM | POA: Diagnosis not present

## 2021-04-06 ENCOUNTER — Ambulatory Visit (INDEPENDENT_AMBULATORY_CARE_PROVIDER_SITE_OTHER): Payer: Medicare HMO | Admitting: Family Medicine

## 2021-04-06 DIAGNOSIS — I25119 Atherosclerotic heart disease of native coronary artery with unspecified angina pectoris: Secondary | ICD-10-CM

## 2021-04-06 LAB — LIPID PANEL
Chol/HDL Ratio: 3 ratio (ref 0.0–5.0)
Cholesterol, Total: 166 mg/dL (ref 100–199)
HDL: 55 mg/dL (ref >39)
LDL Chol Calc (NIH): 74 mg/dL (ref 0–99)
Triglycerides: 226 mg/dL — ABNORMAL HIGH (ref 0–149)
VLDL Cholesterol Cal: 37 mg/dL (ref 5–40)

## 2021-04-06 MED ORDER — METOPROLOL SUCCINATE ER 50 MG PO TB24: 50.0000 mg | ORAL_TABLET | Freq: Two times a day (BID) | ORAL | 3 refills | Status: DC

## 2021-04-06 MED ORDER — METOPROLOL SUCCINATE ER 25 MG PO TB24: 25.0000 mg | ORAL_TABLET | ORAL | 2 refills | Status: DC

## 2021-04-06 NOTE — Patient Instructions (Signed)
I will reach out to cardiology to discuss. ? ?I have refilled your metoprolol. ? ?Take care ? ?Dr. Lacinda Axon  ?

## 2021-04-07 NOTE — Progress Notes (Signed)
? ?Subjective:  ?Patient ID: Paul Baker, male    DOB: 1950-10-29  Age: 71 y.o. MRN: [id number] ? ?CC: ?Chief Complaint  ?Patient presents with  ? ER visit follow up   ?  For chest pain and ABD pain  ?Discuss metoprolol to be resent  75 mg -  ?Discuss -needs order for stress test   ? ? ?HPI: ? ?71 year old male with an extensive past medical history presents for ER follow-up. ? ?Patient had been experiencing chest pain and shortness of breath as well as abdominal pain.  He had a negative work-up in the ER.  He was advised to follow-up with our office as well as cardiology. ?Patient states that he has intermittent chest pain.  He believes that he is experiencing angina.  No current shortness of breath.  No current abdominal pain.  He is currently feeling well.  He would like to discuss whether he needs to see cardiology sooner than his June appointment.  He states that the ER physician advised that he may need a stress test. ? ?Patient Active Problem List  ? Diagnosis Date Noted  ? Grade I diastolic dysfunction 30/07/6224  ? Paroxysmal atrial fibrillation (HCC)   ? Hypothyroidism   ? Asthma 08/05/2019  ? Class 2 obesity 08/05/2019  ? Mixed hyperlipidemia 11/25/2014  ? S/P right TKA 03/23/2014  ? S/P knee replacement 03/23/2014  ? Spinal stenosis of cervicothoracic region 10/08/2013  ? RLS (restless legs syndrome) 10/08/2013  ? DM type 2 causing vascular disease ([place]) 10/08/2013  ? 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/02/2013  ? Diabetic neuropathy ([place]) 07/23/2012  ? Obesity, morbid ([place]) 05/01/2012  ? OSA on CPAP 04/19/2012  ? Chest pain 04/19/2012  ? Essential hypertension, benign 12/16/2009  ? CAD (coronary artery disease) 12/16/2009  ? Hyperglycemia due to type 2 diabetes mellitus ([place]) 11/30/2009  ? ? ?Social Hx   ?Social History  ? ?Socioeconomic History  ? Marital status: Married  ?  Spouse name: Toney Reil  ? Number of children: 2  ? Years of education: college  ? Highest education level: Not on  file  ?Occupational History  ? Occupation: Disabled  ?  Employer: UNEMPLOYED  ?Tobacco Use  ? Smoking status: Former  ?  Packs/day: 1.50  ?  Years: 10.00  ?  Pack years: 15.00  ?  Types: Cigarettes  ?  Start date: 06/19/1967  ?  Quit date: 01/03/1995  ?  Years since quitting: 26.2  ? Smokeless tobacco: Never  ?Vaping Use  ? Vaping Use: Never used  ?Substance and Sexual Activity  ? Alcohol use: No  ?  Alcohol/week: 0.0 standard drinks  ?  Comment: quit drinking in 07/86  ? Drug use: No  ? Sexual activity: Yes  ?  Partners: Female  ?Other Topics Concern  ? Not on file  ?Social History Narrative  ?  71 year old, right-handed, caucasian male with a past medical history of obesity, hypertension, hyperlipidemia, diabetes, obstructive sleep apnea, presenting with frequent nighttime awakenings, excessive daytime sleepiness, also transient confusional episodes.RLS and one beosity, OSA on CPAP with AHI of 3.2 and  setting of 16 cm water , [facility] .  ? Married since 1977.  ? ?Social Determinants of Health  ? ?Financial Resource Strain: Low Risk   ? Difficulty of Paying Living Expenses: Not hard at all  ?Food Insecurity: No Food Insecurity  ? Worried About Charity fundraiser in the Last Year: Never true  ? Ran Out of Food in  the Last Year: Never true  ?Transportation Needs: No Transportation Needs  ? Lack of Transportation (Medical): No  ? Lack of Transportation (Non-Medical): No  ?Physical Activity: Sufficiently Active  ? Days of Exercise per Week: 3 days  ? Minutes of Exercise per Session: 60 min  ?Stress: No Stress Concern Present  ? Feeling of Stress : Only a little  ?Social Connections: Socially Integrated  ? Frequency of Communication with Friends and Family: More than three times a week  ? Frequency of Social Gatherings with Friends and Family: More than three times a week  ? Attends Religious Services: More than 4 times per year  ? Active Member of Clubs or Organizations: Yes  ? Attends Archivist  Meetings: More than 4 times per year  ? Marital Status: Married  ? ? ?Review of Systems ?Per HPI ? ?Objective:  ?BP (!) 142/78   Pulse (!) 102   Temp 98.1 ?F (36.7 ?C)   Ht '5\' 4"'$  (1.626 m)   Wt 221 lb (100.2 kg)   SpO2 98%   BMI 37.93 kg/m?  ? ? ?  04/06/2021  ?  1:43 PM 03/11/2021  ?  6:42 PM 03/11/2021  ?  6:30 PM  ?BP/Weight  ?Systolic BP 409 811 914  ?Diastolic BP 78 70 86  ?Wt. (Lbs) 221    ?BMI 37.93 kg/m2    ? ? ?Physical Exam ?Vitals and nursing note reviewed.  ?Constitutional:   ?   General: He is not in acute distress. ?   Appearance: Normal appearance. He is not ill-appearing.  ?HENT:  ?   Head: Normocephalic and atraumatic.  ?Eyes:  ?   General:     ?   Right eye: No discharge.     ?   Left eye: No discharge.  ?   Conjunctiva/sclera: Conjunctivae normal.  ?Cardiovascular:  ?   Rate and Rhythm: Regular rhythm. Tachycardia present.  ?Pulmonary:  ?   Effort: Pulmonary effort is normal.  ?   Breath sounds: Normal breath sounds. No wheezing, rhonchi or rales.  ?Neurological:  ?   Mental Status: He is alert.  ?Psychiatric:     ?   Mood and Affect: Mood normal.     ?   Behavior: Behavior normal.  ? ? ?Lab Results  ?Component Value Date  ? WBC 10.0 03/11/2021  ? HGB 12.1 (L) 03/11/2021  ? HCT 37.8 (L) 03/11/2021  ? PLT 222 03/11/2021  ? GLUCOSE 269 (H) 03/11/2021  ? CHOL 166 04/05/2021  ? TRIG 226 (H) 04/05/2021  ? HDL 55 04/05/2021  ? [place] 74 04/05/2021  ? ALT 23 05/23/2020  ? AST 23 05/23/2020  ? NA 137 03/11/2021  ? K 3.8 03/11/2021  ? CL 101 03/11/2021  ? CREATININE 1.29 (H) 03/11/2021  ? BUN 33 (H) 03/11/2021  ? CO2 25 03/11/2021  ? TSH 0.812 02/23/2021  ? PSA 2.03 11/13/2013  ? INR 0.9 08/05/2019  ? HGBA1C 8.8 (A) 03/07/2021  ? MICROALBUR 9.5 02/06/2019  ? ? ? ?Assessment & Plan:  ? ?Problem List Items Addressed This Visit   ? ?  ? Cardiovascular and Mediastinum  ? CAD (coronary artery disease)  ?  Patient is currently stable.  I spoke with cardiology regarding his case.  Cardiology to arrange sooner  follow-up.  Patient is to continue his current medications at this time.  Metoprolol was refilled today. ?  ?  ? Relevant Medications  ? metoprolol succinate (TOPROL XL) 25 MG 24 hr  tablet  ? metoprolol succinate (TOPROL-XL) 50 MG 24 hr tablet  ? ? ?Meds ordered this encounter  ?Medications  ? metoprolol succinate (TOPROL XL) 25 MG 24 hr tablet  ?  Sig: Take 1 tablet (25 mg total) by mouth See admin instructions. -Take 1 tablet (25 mg) along with an additional 50 mg tablet for total of 75 mg twice daily  ?  Dispense:  180 tablet  ?  Refill:  2  ? metoprolol succinate (TOPROL-XL) 50 MG 24 hr tablet  ?  Sig: Take 1 tablet (50 mg total) by mouth 2 (two) times daily. -Take 1 tablet (50 mg) along with an additional 25 mg tablet for total of 75 mg twice daily  ?  Dispense:  180 tablet  ?  Refill:  3  ? ? ?Thersa Salt DO ?[place] ? ?

## 2021-04-07 NOTE — Assessment & Plan Note (Addendum)
Patient is currently stable.  I spoke with cardiology regarding his case.  Cardiology to arrange sooner follow-up.  Patient is to continue his current medications at this time.  Metoprolol was refilled today. ?

## 2021-04-10 DIAGNOSIS — M179 Osteoarthritis of knee, unspecified: Secondary | ICD-10-CM | POA: Diagnosis not present

## 2021-04-10 DIAGNOSIS — G4733 Obstructive sleep apnea (adult) (pediatric): Secondary | ICD-10-CM | POA: Diagnosis not present

## 2021-04-11 DIAGNOSIS — D225 Melanocytic nevi of trunk: Secondary | ICD-10-CM | POA: Diagnosis not present

## 2021-04-11 DIAGNOSIS — L820 Inflamed seborrheic keratosis: Secondary | ICD-10-CM | POA: Diagnosis not present

## 2021-04-11 DIAGNOSIS — L821 Other seborrheic keratosis: Secondary | ICD-10-CM | POA: Diagnosis not present

## 2021-04-14 DIAGNOSIS — G4733 Obstructive sleep apnea (adult) (pediatric): Secondary | ICD-10-CM | POA: Diagnosis not present

## 2021-04-19 DIAGNOSIS — E1110 Type 2 diabetes mellitus with ketoacidosis without coma: Secondary | ICD-10-CM | POA: Diagnosis not present

## 2021-04-20 ENCOUNTER — Ambulatory Visit (INDEPENDENT_AMBULATORY_CARE_PROVIDER_SITE_OTHER): Payer: Medicare HMO | Admitting: Family Medicine

## 2021-04-20 ENCOUNTER — Other Ambulatory Visit: Payer: Self-pay

## 2021-04-20 DIAGNOSIS — U071 COVID-19: Secondary | ICD-10-CM | POA: Diagnosis not present

## 2021-04-20 MED ORDER — MOLNUPIRAVIR EUA 200MG CAPSULE: 4.0000 | ORAL_CAPSULE | Freq: Two times a day (BID) | ORAL | 0 refills | Status: AC

## 2021-04-20 NOTE — Progress Notes (Signed)
Virtual Visit via Telephone Note ? ?I connected with Paul Baker on 04/20/21 at 11:20 AM EDT by telephone and verified that I am speaking with the correct person using two identifiers. ? ?Location: ?Patient: Home. ?Provider: Office. ?  ?I discussed the limitations, risks, security and privacy concerns of performing an evaluation and management service by telephone and the availability of in person appointments. I also discussed with the patient that there may be a patient responsible charge related to this service. The patient expressed understanding and agreed to proceed. ? ?History of Present Illness: ?71 year old male with multiple comorbidities reports that he did not feel well this morning.  He states that he has had cough, congestion,, headache, and low-grade temp.  He states that he took a home COVID test and it was positive.  He wife is also sick and is COVID-positive.  Denies any shortness of breath.  He simply feels under the weather.  He states that he wanted to get on top of this given his medical problems and prevent this from getting worse.  Patient is a candidate for any desired antiviral treatment. ? ?Observations/Objective: ?Speaks in full sentences.  No respiratory distress. ? ?Assessment and Plan: ?COVID-19 - Treating with molnupiravir.  Paxlovid interacts with Eliquis so cannot use this. ? ?Follow Up Instructions: ?Patient is to go to the ER if he worsens. ?  ?I discussed the assessment and treatment plan with the patient. The patient was provided an opportunity to ask questions and all were answered. The patient agreed with the plan and demonstrated an understanding of the instructions. ?  ?The patient was advised to call back or seek an in-person evaluation if the symptoms worsen or if the condition fails to improve as anticipated. ? ?I provided 5 minutes of non-face-to-face time during this encounter. ? ?Coral Spikes, DO ? ?

## 2021-05-03 DIAGNOSIS — R609 Edema, unspecified: Secondary | ICD-10-CM | POA: Diagnosis not present

## 2021-05-03 DIAGNOSIS — I4891 Unspecified atrial fibrillation: Secondary | ICD-10-CM | POA: Diagnosis not present

## 2021-05-03 DIAGNOSIS — N182 Chronic kidney disease, stage 2 (mild): Secondary | ICD-10-CM | POA: Diagnosis not present

## 2021-05-03 DIAGNOSIS — R42 Dizziness and giddiness: Secondary | ICD-10-CM | POA: Diagnosis not present

## 2021-05-03 DIAGNOSIS — I129 Hypertensive chronic kidney disease with stage 1 through stage 4 chronic kidney disease, or unspecified chronic kidney disease: Secondary | ICD-10-CM | POA: Diagnosis not present

## 2021-05-03 DIAGNOSIS — E1122 Type 2 diabetes mellitus with diabetic chronic kidney disease: Secondary | ICD-10-CM | POA: Diagnosis not present

## 2021-05-03 DIAGNOSIS — Z6836 Body mass index (BMI) 36.0-36.9, adult: Secondary | ICD-10-CM | POA: Diagnosis not present

## 2021-05-05 ENCOUNTER — Telehealth: Payer: Self-pay | Admitting: Nurse Practitioner

## 2021-05-05 NOTE — Telephone Encounter (Signed)
Called patient because he has pt assistance ozempic in the fridge. I left him a VM ?

## 2021-05-05 NOTE — Telephone Encounter (Signed)
Pt picked up medication.

## 2021-05-09 ENCOUNTER — Other Ambulatory Visit: Payer: Self-pay | Admitting: Nurse Practitioner

## 2021-05-09 DIAGNOSIS — E1159 Type 2 diabetes mellitus with other circulatory complications: Secondary | ICD-10-CM

## 2021-05-10 DIAGNOSIS — G4733 Obstructive sleep apnea (adult) (pediatric): Secondary | ICD-10-CM | POA: Diagnosis not present

## 2021-05-10 DIAGNOSIS — M179 Osteoarthritis of knee, unspecified: Secondary | ICD-10-CM | POA: Diagnosis not present

## 2021-05-14 DIAGNOSIS — G4733 Obstructive sleep apnea (adult) (pediatric): Secondary | ICD-10-CM | POA: Diagnosis not present

## 2021-05-19 DIAGNOSIS — E1110 Type 2 diabetes mellitus with ketoacidosis without coma: Secondary | ICD-10-CM | POA: Diagnosis not present

## 2021-05-25 ENCOUNTER — Other Ambulatory Visit: Payer: Self-pay | Admitting: Neurology

## 2021-05-25 DIAGNOSIS — G2581 Restless legs syndrome: Secondary | ICD-10-CM

## 2021-05-25 MED ORDER — ROPINIROLE HCL 4 MG PO TABS: 2.0000 mg | ORAL_TABLET | Freq: Two times a day (BID) | ORAL | 2 refills | Status: DC

## 2021-06-01 ENCOUNTER — Ambulatory Visit: Payer: Medicare HMO | Admitting: Family Medicine

## 2021-06-01 ENCOUNTER — Encounter: Payer: Self-pay | Admitting: Family Medicine

## 2021-06-01 VITALS — BP 144/79 | HR 87 | Ht 64.0 in | Wt 216.0 lb

## 2021-06-01 DIAGNOSIS — G4733 Obstructive sleep apnea (adult) (pediatric): Secondary | ICD-10-CM | POA: Diagnosis not present

## 2021-06-01 DIAGNOSIS — G2581 Restless legs syndrome: Secondary | ICD-10-CM | POA: Diagnosis not present

## 2021-06-01 DIAGNOSIS — G4719 Other hypersomnia: Secondary | ICD-10-CM

## 2021-06-01 DIAGNOSIS — Z9989 Dependence on other enabling machines and devices: Secondary | ICD-10-CM | POA: Diagnosis not present

## 2021-06-01 MED ORDER — ROPINIROLE HCL 4 MG PO TABS: 2.0000 mg | ORAL_TABLET | Freq: Two times a day (BID) | ORAL | 3 refills | Status: DC

## 2021-06-01 MED ORDER — ROPINIROLE HCL ER 4 MG PO TB24: 4.0000 mg | ORAL_TABLET | Freq: Every day | ORAL | 3 refills | Status: DC

## 2021-06-01 NOTE — Progress Notes (Signed)
PATIENT: Paul Baker DOB: 20-Jul-1950  REASON FOR VISIT: follow up HISTORY FROM: patient  Chief Complaint  Patient presents with   Obstructive Sleep Apnea    Rm 10, alone. Here for 6 month CPAP f/u. Pt reports doing well on CPAP, no concerns.      HISTORY OF PRESENT ILLNESS:  06/01/21 ALL: Paul Baker returns for follow up for OSA on CPAP, RLS and EDS. He reports getting new CPAP 12/2020. He reports not knowing he needed initial compliance visit. He is doing well with new CPAP machine. He continues to improve compliance. We increased pressure to 11cmH20 at last visit 11/2020. AHI has improved from 6.8/hr to 5.6/hr. Now 90% compliant with daily usage and 73% with four hour. He denies concerns with machine or supplies. He does have a large air leak. He feels that this is due to the headgear slipping off his face at night. He has not received a replacement headgear since starting new machine.   He continues ropinirole XL '4mg'$  QHS and IR '2mg'$  BID. He feels that this combination works well. He usually takes '2mg'$  around noon and then '2mg'$  about an hour before XL dose. He has an occasional night where legs are more bothersome but overall feels he is doing ok.   He continues modafinil most every day. He reports that he started taking 1/2 tablet due to some insomnia but reports that daytime sleepiness worsened. He resumed full '200mg'$  dose and feels it works better. Last filled 03/19/2021 for 90 tablets. Cardiology is aware he is taking modafinil. Blood pressure is usually around 140/80-90. He was recently seen in the ER for abdominal and substernal chest pain. Workup was unremarkable. He was seen by PCP since. He is scheduled to see cardiology 06/28/2021.     12/01/2020 ALL:  Paul Baker returns for follow up for OSA on CPAP, EDS and RLS. He continues ropinirole XL '4mg'$  BID and IR '2mg'$  BID. RLS is well managed. He continues to work on 4 hour compliance. He is concerned data is not recording correctly.  He is using CPAP nightly and feels that he uses for more than 4 hours. His machine is 70 years old. Modafinil does not work as well as it used to but he does not want to stop it. Armodafinil works better but too expensive. He continues close follow up with PCP.     05/26/2020 ALL: He returns for follow up for OSA on CPAP, EDS and RLS. He continues ropinirole XL '4mg'$  BID and IR '2mg'$  BID. He feels RLS symptoms are fairly stable. He has not resumed modafinil for EDS. He reports that medication is expensive and he is unsure if it helps. He does not monitor BP at home. Readings are usually 130's/80's in the PCP office.   He reports that he is using CPAP nightly. He did get a new FFM and feels it has worked better than nasal pillow. He places mask around 10pm and takes it off around 7. He does wake to urinate a couple of times throughout the night. ResMed data documenting 3-4 hour usage, on average. Also not recording any data past 04/13/2020. 4 hour compliance has consistently been low. Paul Baker feels this is inaccurate. He is adamant that he has used CPAP consistently.   He was diagnosed with Covid about 3-4 weeks ago. He was seen by PCP 05/21/2020 for continued shob. Xray showed possible pna. He was treated with abx and steroids. ER visit 5/23 for dyspnea, workup unremarkable. He  reports feeling better, today. He admits CBGs continue to fluctuate. They seem a little better over the past few weeks but were elevated last night and low this morning. He is followed closely by endocrinology.   Data pulled with SD card reveals that he has used CPAP 30/30 days. He used CPAP greater than 4 hours 20/30 days for compliance of 67%. Average usage was 4hr 66mn. Residual AHI 6.9 on set pressure of 10cmH20 and ERP 3. Leak was 21.1l/min.   11/26/2019 ALL:  RMILEY LINDONis a 71y.o. male here today for follow up for OSA on CPAP.  He has done much better with CPAP compliance but continues to struggle with his mask.   He is using a nasal pillow and reports that it slips off of his face every night.  He has tried tightening headgear with no improvement.  He reports that unusual sounds from his CPAP have improved with replacing his supplies.  He continues to work closely with primary care, cardiology and endocrinology.  Blood sugars continue to be uncontrolled.  He is having significant daytime fatigue.  Compliance report dated 10/26/2019 through 11/24/2019 reveals that he used CPAP 26 of the past 30 days for compliance of 87%.  He used CPAP greater than 4 hours 17 of the past 30 days for compliance of 57%.  Average usage on days used was 4 hours and 47 minutes.  Residual AHI was elevated at 10.5 on 10 cm of water and an EPR of 3.  There was a leak noted in the 95th percentile of 42.6 L/min.  HISTORY: (copied from previous note)  Paul Baker a 71y.o. male here today for follow up for OSA on CPAP, RLS and EDS. He continues to do fairly well on Requip and modafinil. He admits that he has had a difficult time with using CPAP. He has not been able to get comfortable with his mask. He also states that CPAP will cut off in the middle of the night. He called [employer] but has not taken his machine in to be checked out. He knows that he needs to use CPAP and wants to continue working on compliance.    Compliance report dated 07/26/2019 through 08/24/2019 reveals that he used CPAP 28 of the last 30 days for compliance of 67%.  He used CPAP greater than 4 hours 6 of the past 30 days for compliance of 20%.  Average usage on days used was 3 hours and 35 minutes.  Residual AHI was 6.8 with a set pressure of 10 cm of water and an EPR of 3.  There was a leak noted in the 95th percentile of 25.4.   He was seen in the ER on 08/04/2019 for dizziness and right sided numbness. CBG was elevated (300's). Insulin was increased and he was started on Plavix for concerns of TIA. He is followed by PCP, last seen 8/18.     REVIEW OF SYSTEMS:  Out of a complete 14 system review of symptoms, the patient complains only of the following symptoms, day time sleepiness, fatigue, RLS and all other reviewed systems are negative.  ESS: 19/24, previously 19/24 FSS: 36  ALLERGIES: Allergies  Allergen Reactions   Tape Rash    Pulls off skin   Cardizem [Diltiazem Hcl]     Edema    Cardura [Doxazosin Mesylate]     Headaches / cramps   Crestor [Rosuvastatin] Other (See Comments)    Leg cramps   Lipitor [Atorvastatin] Other (  See Comments)    Leg cramps   Paxil [Paroxetine Hcl]     Unknown reaction    Codeine Rash and Other (See Comments)    Headache    Gabapentin Rash    HOME MEDICATIONS: Outpatient Medications Prior to Visit  Medication Sig Dispense Refill   acetaminophen (TYLENOL) 325 MG tablet Take 2 tablets (650 mg total) by mouth every 6 (six) hours as needed for mild pain (or Fever >/= 101). 30 tablet 1   albuterol (PROVENTIL) (2.5 MG/3ML) 0.083% nebulizer solution Take 3 mLs (2.5 mg total) by nebulization every 6 (six) hours as needed for up to 7 days for wheezing or shortness of breath. 84 mL 0   albuterol (VENTOLIN HFA) 108 (90 Base) MCG/ACT inhaler Inhale 2 puffs into the lungs every 6 (six) hours as needed for wheezing or shortness of breath. 18 g 5   alfuzosin (UROXATRAL) 10 MG 24 hr tablet Take 10 mg by mouth daily.     apixaban (ELIQUIS) 5 MG TABS tablet Take 1 tablet (5 mg total) by mouth 2 (two) times daily. For stroke prevention 60 tablet 5   B-D ULTRAFINE III SHORT PEN 31G X 8 MM MISC USE 1 PEN NEEDLE THREE TIMES DAILY 100 each 2   ezetimibe (ZETIA) 10 MG tablet Take 1 tablet (10 mg total) by mouth daily. 90 tablet 3   FARXIGA 10 MG TABS tablet Take 10 mg by mouth daily.     Fluticasone-Umeclidin-Vilant (TRELEGY ELLIPTA) 100-62.5-25 MCG/ACT AEPB INHALE 1 PUFF INTO LUNGS ONCE DAILY 60 each 6   furosemide (LASIX) 40 MG tablet Take by mouth. Take '40mg'$  daily alternating with '80mg'$  every other day.     Glucagon (GVOKE  HYPOPEN 2-PACK) 1 MG/0.2ML SOAJ Inject 1 mg subcutaneously once as needed for low blood sugar. May repeat dose in 15 minutes as needed using a new device. 0.4 mL 1   insulin regular human CONCENTRATED (HUMULIN R U-500 KWIKPEN) 500 UNIT/ML KwikPen Inject 65-85 Units into the skin 3 (three) times daily with meals. Inject 85 units with breakfast and lunch, and 65 units with supper if glucose is above 90 and you are eating 60 mL 2   levothyroxine (SYNTHROID) 137 MCG tablet TAKE 1 TABLET BY MOUTH ONCE DAILY BEFORE BREAKFAST 90 tablet 0   loratadine (CLARITIN) 10 MG tablet Take 10 mg by mouth daily as needed for allergies.      losartan (COZAAR) 100 MG tablet Take 100 mg by mouth daily.     metFORMIN (GLUCOPHAGE) 500 MG tablet TAKE 1 TABLET BY MOUTH TWICE DAILY WITH MEALS 180 tablet 1   metoprolol succinate (TOPROL XL) 25 MG 24 hr tablet Take 1 tablet (25 mg total) by mouth See admin instructions. -Take 1 tablet (25 mg) along with an additional 50 mg tablet for total of 75 mg twice daily 180 tablet 2   metoprolol succinate (TOPROL-XL) 50 MG 24 hr tablet Take 1 tablet (50 mg total) by mouth 2 (two) times daily. -Take 1 tablet (50 mg) along with an additional 25 mg tablet for total of 75 mg twice daily 180 tablet 3   modafinil (PROVIGIL) 200 MG tablet Take 1 tablet (200 mg total) by mouth daily. 90 tablet 1   MYRBETRIQ 50 MG TB24 tablet Take 50 mg by mouth daily.     nitroGLYCERIN (NITROSTAT) 0.4 MG SL tablet DISSOLVE 1 TABLET UNDER TONGUE EVERY 5 MINUTES UP TO 15 MIN FOR CHESTPAIN. IF NO RELIEF CALL 911. 25 tablet 3  ondansetron (ZOFRAN-ODT) 4 MG disintegrating tablet Take 1 tablet (4 mg total) by mouth every 8 (eight) hours as needed for nausea or vomiting. 20 tablet 0   ONE TOUCH ULTRA TEST test strip TEST BLOOD SUGAR UP TO 4 TIMES DAILY. 150 each 5   ONETOUCH DELICA LANCETS 78G MISC USE AS DIRECTED TO TEST BLOOD SUGAR 4 TIMES DAILY. 150 each 5   pravastatin (PRAVACHOL) 40 MG tablet TAKE 1 TABLET BY MOUTH  ONCE DAILY IN THE EVENING 90 tablet 2   Semaglutide, 1 MG/DOSE, 4 MG/3ML SOPN Inject 1 mg as directed once a week. 6 mL 3   Continuous Blood Gluc Receiver (DEXCOM G6 RECEIVER) DEVI 1 Piece by Does not apply route as needed. 1 each 0   Continuous Blood Gluc Sensor (DEXCOM G6 SENSOR) MISC 4 Pieces by Does not apply route once a week. 4 each 2   rOPINIRole (REQUIP XL) 4 MG 24 hr tablet Take 1 tablet (4 mg total) by mouth at bedtime. 90 tablet 1   rOPINIRole (REQUIP) 4 MG tablet Take 0.5 tablets (2 mg total) by mouth 2 (two) times daily. 90 tablet 2   No facility-administered medications prior to visit.    PAST MEDICAL HISTORY: Past Medical History:  Diagnosis Date   Arthritis    Asthma    Atrial fibrillation [facility])    Diagnosed December 2021   Colon polyp    Coronary atherosclerosis of native coronary artery    a. 2011: cath showing 90% stenosis along small non-dominant RCA (too small for PCI). b. 01/2018: cath showing nonobstructive CAD with 60 to 70% proximal to mid nondominant RCA stenosis, 50% mid LAD and 40 to 50% OM1   DVT (deep venous thrombosis) ([place]) 2005   Right arm   Essential hypertension    Headache    History of transfusion    Hypothyroidism    Mixed hyperlipidemia    Morbid obesity ([place])    MRSA (methicillin resistant staph aureus) culture positive    08/2012   Narcolepsy    OSA (obstructive sleep apnea)    CPAP   Osteoarthritis    Pneumonia    Psoriasis    Pulmonary embolism ([place]) 2004   RLS (restless legs syndrome)    Rotator cuff disorder    Left   Septic arthritis of knee, left (HCC)    Skin cancer, basal cell    Type 2 diabetes mellitus ([place])     PAST SURGICAL HISTORY: Past Surgical History:  Procedure Laterality Date   BACK SURGERY     CATARACT EXTRACTION Bilateral    COLONOSCOPY     CYST REMOVAL TRUNK     from back   EYE SURGERY Left 2016   laser to left eye   HIP SURGERY     bone removed from both sides of hip   KNEE ARTHROTOMY Right  12/04/2014   Procedure: KNEE ARTHROTOMY PATELLA LIGAMENT RECONSTRUSION AND REPAIR RIGHT KNEE;  Surgeon: Paralee Cancel, MD;  Location: [place];  Service: Orthopedics;  Laterality: Right;   KNEE SURGERY     X 25 TIMES   LEFT HEART CATH AND CORONARY ANGIOGRAPHY N/A 01/17/2018   Procedure: LEFT HEART CATH AND CORONARY ANGIOGRAPHY;  Surgeon: Belva Crome, MD;  Location: [facility];  Service: Cardiovascular;  Laterality: N/A;   LUMBAR DISC SURGERY     Left L3, L4, L5 discecotomy with decompression of L4 root   TONSILLECTOMY     TOTAL KNEE ARTHROPLASTY  2003   LEFT  TOTAL KNEE ARTHROPLASTY Right 03/23/2014   Procedure: RIGHT TOTAL KNEE ARTHROPLASTY AND REMOVAL RIGHT TIBIAL  DEEP IMPLANT STAPLE;  Surgeon: Paralee Cancel, MD;  Location: [facility];  Service: Orthopedics;  Laterality: Right;   TOTAL KNEE REVISION  2005   LEFT   WRIST SURGERY      FAMILY HISTORY: Family History  Problem Relation Age of Onset   Hypertension Father    Heart attack Father    Kidney Stones Father    Seizures Grandchild    Narcolepsy Grandchild    Diabetes Sister     SOCIAL HISTORY: Social History   Socioeconomic History   Marital status: Married    Spouse name: Information systems manager   Number of children: 2   Years of education: college   Highest education level: Not on file  Occupational History   Occupation: Disabled    Employer: UNEMPLOYED  Tobacco Use   Smoking status: Former    Packs/day: 1.50    Years: 10.00    Pack years: 15.00    Types: Cigarettes    Start date: 06/19/1967    Quit date: 01/03/1995    Years since quitting: 26.4   Smokeless tobacco: Never  Vaping Use   Vaping Use: Never used  Substance and Sexual Activity   Alcohol use: No    Alcohol/week: 0.0 standard drinks    Comment: quit drinking in 07/86   Drug use: No   Sexual activity: Yes    Partners: Female  Other Topics Concern   Not on file  Social History Narrative    71 year old, right-handed, caucasian male with a past medical history  of obesity, hypertension, hyperlipidemia, diabetes, obstructive sleep apnea, presenting with frequent nighttime awakenings, excessive daytime sleepiness, also transient confusional episodes.RLS and one beosity, OSA on CPAP with AHI of 3.2 and  setting of 16 cm water , [facility] .   Married since 1977.   Social Determinants of Health   Financial Resource Strain: Low Risk    Difficulty of Paying Living Expenses: Not hard at all  Food Insecurity: No Food Insecurity   Worried About Charity fundraiser in the Last Year: Never true   [place] in the Last Year: Never true  Transportation Needs: No Transportation Needs   Lack of Transportation (Medical): No   Lack of Transportation (Non-Medical): No  Physical Activity: Sufficiently Active   Days of Exercise per Week: 3 days   Minutes of Exercise per Session: 60 min  Stress: No Stress Concern Present   Feeling of Stress : Only a little  Social Connections: Engineer, building services of Communication with Friends and Family: More than three times a week   Frequency of Social Gatherings with Friends and Family: More than three times a week   Attends Religious Services: More than 4 times per year   Active Member of [employer] or Organizations: Yes   Attends Music therapist: More than 4 times per year   Marital Status: Married  Human resources officer Violence: Not At Risk   Fear of Current or Ex-Partner: No   Emotionally Abused: No   Physically Abused: No   Sexually Abused: No     PHYSICAL EXAM  Vitals:   06/01/21 1335  BP: (!) 144/79  Pulse: 87  Weight: 216 lb (98 kg)  Height: '5\' 4"'$  (1.626 m)     Body mass index is 37.08 kg/m.  Generalized: Well developed, in no acute distress  Cardiology: normal rate and rhythm, no murmur  noted Respiratory: clear to auscultation bilaterally  Neurological examination  Mentation: Alert oriented to time, place, history taking. Follows all commands speech and language  fluent Cranial nerve II-XII: Pupils were equal round reactive to light. Extraocular movements were full, visual field were full  Motor: The motor testing reveals 5 over 5 strength of all 4 extremities. Good symmetric motor tone is noted throughout.  Gait and station: Gait is stable without assistive devices.   DIAGNOSTIC DATA (LABS, IMAGING, TESTING) - I reviewed patient records, labs, notes, testing and imaging myself where available.      View : No data to display.           Lab Results  Component Value Date   WBC 10.0 03/11/2021   HGB 12.1 (L) 03/11/2021   HCT 37.8 (L) 03/11/2021   MCV 93.6 03/11/2021   PLT 222 03/11/2021      Component Value Date/Time   NA 137 03/11/2021 1520   NA 138 11/12/2020 0000   K 3.8 03/11/2021 1520   CL 101 03/11/2021 1520   CO2 25 03/11/2021 1520   GLUCOSE 269 (H) 03/11/2021 1520   BUN 33 (H) 03/11/2021 1520   BUN 24 (A) 11/12/2020 0000   CREATININE 1.29 (H) 03/11/2021 1520   CREATININE 1.11 02/06/2019 0813   CALCIUM 8.9 03/11/2021 1520   PROT 6.9 05/23/2020 2150   PROT 6.6 03/10/2020 0805   ALBUMIN 4.3 11/12/2020 0000   ALBUMIN 4.3 03/10/2020 0805   AST 23 05/23/2020 2150   ALT 23 05/23/2020 2150   ALKPHOS 50 05/23/2020 2150   BILITOT 0.4 05/23/2020 2150   BILITOT 0.3 03/10/2020 0805   GFRNONAA >60 03/11/2021 1520   GFRNONAA 68 02/06/2019 0813   GFRAA 87 12/04/2019 0832   GFRAA 79 02/06/2019 0813   Lab Results  Component Value Date   CHOL 166 04/05/2021   HDL 55 04/05/2021   LDLCALC 74 04/05/2021   TRIG 226 (H) 04/05/2021   CHOLHDL 3.0 04/05/2021   Lab Results  Component Value Date   HGBA1C 8.8 (A) 03/07/2021   No results found for: VITAMINB12 Lab Results  Component Value Date   TSH 0.812 02/23/2021     ASSESSMENT AND PLAN 71 y.o. year old male  has a past medical history of Arthritis, Asthma, Atrial fibrillation ([place]), Colon polyp, Coronary atherosclerosis of native coronary artery, DVT (deep venous thrombosis)  ([place]) (2005), Essential hypertension, Headache, History of transfusion, Hypothyroidism, Mixed hyperlipidemia, Morbid obesity ([place]), MRSA (methicillin resistant staph aureus) culture positive, Narcolepsy, OSA (obstructive sleep apnea), Osteoarthritis, Pneumonia, Psoriasis, Pulmonary embolism ([place]) (2004), RLS (restless legs syndrome), Rotator cuff disorder, Septic arthritis of knee, left ([place]), Skin cancer, basal cell, and Type 2 diabetes mellitus ([place]). here with     ICD-10-CM   1. OSA on CPAP  G47.33 For home use only DME continuous positive airway pressure (CPAP)   Z99.89     2. Excessive daytime sleepiness  G47.19     3. RLS (restless legs syndrome)  G25.81 rOPINIRole (REQUIP) 4 MG tablet    rOPINIRole (REQUIP XL) 4 MG 24 hr tablet      Paul Baker is doing better on CPAP therapy. Compliance report reveals excellent daily compliance with now acceptable 4-hour compliance. 4 hour compliance is steadily improving. Leak was improved but now high. AHI now 5.6, previously 6.8/hr. We will continue current settings. I have ordered mask refitting due to elevated leak. He will inquire about headgear. RLS is well managed on ropinirole and he will continue  as prescribed. EDS remains a concern and multiple factors contributing. We will continue modafinil but expectations of effectiveness discussed. He will call if readings consistently greater than 140/80. PDMP appropriate. Last filled 03/19/2021, due 06/19/2021. He will continue close follow-up with primary care and endocrinology for uncontrolled diabetes and stroke prevention. Continue close follow up with cardiology. We have discussed link between fatigue/daytime sleepiness with fluctuating blood sugars. He was encouraged to continue using CPAP nightly and for greater than 4 hours each night. We will update supply orders as indicated. Risks of untreated sleep apnea review and education materials provided. Healthy lifestyle habits encouraged. He will  follow up with Dr Brett Fairy as he was last seen by her in 2018. He also has questions regarding need for repeat MSLT. Last MSLT abnormal but not diagnostic for narcolepsy. He verbalizes understanding and agreement with this plan.     Orders Placed This Encounter  Procedures   For home use only DME continuous positive airway pressure (CPAP)    Mask/ headgear refitting please due to elevated leak, reports headgear slips off head at night    Order Specific Question:   Length of Need    Answer:   Lifetime    Order Specific Question:   Patient has OSA or probable OSA    Answer:   Yes    Order Specific Question:   Is the patient currently using CPAP in the home    Answer:   Yes    Order Specific Question:   Settings    Answer:   Other see comments    Order Specific Question:   CPAP supplies needed    Answer:   Mask, headgear, cushions, filters, heated tubing and water chamber      Meds ordered this encounter  Medications   rOPINIRole (REQUIP) 4 MG tablet    Sig: Take 0.5 tablets (2 mg total) by mouth 2 (two) times daily.    Dispense:  90 tablet    Refill:  3    Order Specific Question:   Supervising Provider    Answer:   Melvenia Beam [[id number]]   rOPINIRole (REQUIP XL) 4 MG 24 hr tablet    Sig: Take 1 tablet (4 mg total) by mouth at bedtime.    Dispense:  90 tablet    Refill:  3    Order Specific Question:   Supervising Provider    Answer:   Melvenia Beam [id number]      I spent 45 minutes of face-to-face and non-face-to-face time with patient.  This included previsit chart review, lab review, study review, order entry, electronic health record documentation, patient education.      Debbora Presto, FNP-C 06/01/2021, 4:05 PM [facility] [address] [place], [place] [address] [phone]

## 2021-06-01 NOTE — Progress Notes (Signed)
CM sent to [facility] for new order ?

## 2021-06-01 NOTE — Patient Instructions (Addendum)
Below is our plan:  We will continue ropinirole XL '4mg'$  at bedtime and immediate release '2mg'$  up to twice daily. Continue modafinil '200mg'$  daily but please keep a close eye on your blood pressure at home.   You are doing better with CPAP compliance!!! Keep up the good work.   Please continue using your CPAP regularly. While your insurance requires that you use CPAP at least 4 hours each night on 70% of the nights, I recommend, that you not skip any nights and use it throughout the night if you can. Getting used to CPAP and staying with the treatment long term does take time and patience and discipline. Untreated obstructive sleep apnea when it is moderate to severe can have an adverse impact on cardiovascular health and raise her risk for heart disease, arrhythmias, hypertension, congestive heart failure, stroke and diabetes. Untreated obstructive sleep apnea causes sleep disruption, nonrestorative sleep, and sleep deprivation. This can have an impact on your day to day functioning and cause daytime sleepiness and impairment of cognitive function, memory loss, mood disturbance, and problems focussing. Using CPAP regularly can improve these symptoms.  Please make sure you are staying well hydrated. I recommend 50-60 ounces daily. Well balanced diet and regular exercise encouraged. Consistent sleep schedule with 6-8 hours recommended.   Please continue follow up with care team as directed.   Follow up with me in 6 months   You may receive a survey regarding today's visit. I encourage you to leave honest feed back as I do use this information to improve patient care. Thank you for seeing me today!

## 2021-06-02 ENCOUNTER — Other Ambulatory Visit: Payer: Self-pay | Admitting: Nurse Practitioner

## 2021-06-03 ENCOUNTER — Emergency Department (HOSPITAL_COMMUNITY)
Admission: EM | Admit: 2021-06-03 | Discharge: 2021-06-03 | Disposition: A | Discharge status: Home / Self Care | Payer: Medicare HMO | Attending: Emergency Medicine | Admitting: Emergency Medicine

## 2021-06-03 ENCOUNTER — Emergency Department (HOSPITAL_COMMUNITY): Payer: Medicare HMO

## 2021-06-03 ENCOUNTER — Encounter (HOSPITAL_COMMUNITY): Payer: Self-pay | Admitting: Emergency Medicine

## 2021-06-03 DIAGNOSIS — R7989 Other specified abnormal findings of blood chemistry: Secondary | ICD-10-CM | POA: Insufficient documentation

## 2021-06-03 DIAGNOSIS — R739 Hyperglycemia, unspecified: Secondary | ICD-10-CM

## 2021-06-03 DIAGNOSIS — Z7901 Long term (current) use of anticoagulants: Secondary | ICD-10-CM | POA: Insufficient documentation

## 2021-06-03 DIAGNOSIS — Z7984 Long term (current) use of oral hypoglycemic drugs: Secondary | ICD-10-CM | POA: Diagnosis not present

## 2021-06-03 DIAGNOSIS — Z794 Long term (current) use of insulin: Secondary | ICD-10-CM | POA: Insufficient documentation

## 2021-06-03 DIAGNOSIS — E1165 Type 2 diabetes mellitus with hyperglycemia: Secondary | ICD-10-CM | POA: Diagnosis not present

## 2021-06-03 DIAGNOSIS — R059 Cough, unspecified: Secondary | ICD-10-CM | POA: Diagnosis not present

## 2021-06-03 LAB — CBC
HCT: 38.8 % — ABNORMAL LOW (ref 39.0–52.0)
Hemoglobin: 12.8 g/dL — ABNORMAL LOW (ref 13.0–17.0)
MCH: 30 pg (ref 26.0–34.0)
MCHC: 33 g/dL (ref 30.0–36.0)
MCV: 91.1 fL (ref 80.0–100.0)
Platelets: 229 10*3/uL (ref 150–400)
RBC: 4.26 MIL/uL (ref 4.22–5.81)
RDW: 13.3 % (ref 11.5–15.5)
WBC: 9.8 10*3/uL (ref 4.0–10.5)
nRBC: 0 % (ref 0.0–0.2)

## 2021-06-03 LAB — BASIC METABOLIC PANEL
Anion gap: 12 (ref 5–15)
BUN: 43 mg/dL — ABNORMAL HIGH (ref 8–23)
CO2: 23 mmol/L (ref 22–32)
Calcium: 9.4 mg/dL (ref 8.9–10.3)
Chloride: 94 mmol/L — ABNORMAL LOW (ref 98–111)
Creatinine, Ser: 1.68 mg/dL — ABNORMAL HIGH (ref 0.61–1.24)
GFR, Estimated: 44 mL/min — ABNORMAL LOW (ref >60)
Glucose, Bld: 380 mg/dL — ABNORMAL HIGH (ref 70–99)
Potassium: 4.1 mmol/L (ref 3.5–5.1)
Sodium: 129 mmol/L — ABNORMAL LOW (ref 135–145)

## 2021-06-03 LAB — URINALYSIS, ROUTINE W REFLEX MICROSCOPIC
Bacteria, UA: NONE SEEN
Bilirubin Urine: NEGATIVE
Glucose, UA: >=500 mg/dL — AB
Hgb urine dipstick: NEGATIVE
Ketones, ur: NEGATIVE mg/dL
Leukocytes,Ua: NEGATIVE
Nitrite: NEGATIVE
Protein, ur: NEGATIVE mg/dL
Specific Gravity, Urine: 1.011 (ref 1.005–1.030)
pH: 6 (ref 5.0–8.0)

## 2021-06-03 LAB — CBG MONITORING, ED
Glucose-Capillary: 348 mg/dL — ABNORMAL HIGH (ref 70–99)
Glucose-Capillary: 207 mg/dL — ABNORMAL HIGH (ref 70–99)

## 2021-06-03 MED ORDER — LACTATED RINGERS IV BOLUS
1000.0000 mL | Freq: Once | INTRAVENOUS | Status: AC
  Administered 2021-06-03: 1000 mL via INTRAVENOUS

## 2021-06-03 MED ORDER — INSULIN ASPART 100 UNIT/ML IJ SOLN
10.0000 [IU] | Freq: Once | INTRAMUSCULAR | Status: AC
  Administered 2021-06-03: 10 [IU] via INTRAVENOUS
  Filled 2021-06-03: qty 1

## 2021-06-03 NOTE — Discharge Instructions (Signed)
Your creatinine has increased from 1.3-1.7.  This is a sign that your kidneys are not doing quite as well.  Follow-up with Dr. Wolfgang Phoenix.  Your sugars improved with treatment here but keep a close eye on it at home.

## 2021-06-03 NOTE — ED Provider Notes (Signed)
[facility] EMERGENCY DEPARTMENT Provider Note   CSN: [id number] Arrival date & time: 06/03/21  1846     History  Chief Complaint  Patient presents with   Hyperglycemia    Paul Baker is a 71 y.o. male.   Hyperglycemia Patient presents lightheaded with high blood sugar.  States his sugars been running higher since this morning.  Sugars up to 500 home.  Also feeling dizzy.  Unsure why his sugar would be high.  History of diabetes but states he has been eating well.  Has had a little bit of a cough at times.  States he gets lightheaded when he stands up.  No known sick contacts.    Home Medications Prior to Admission medications   Medication Sig Start Date End Date Taking? Authorizing Provider  acetaminophen (TYLENOL) 325 MG tablet Take 2 tablets (650 mg total) by mouth every 6 (six) hours as needed for mild pain (or Fever >/= 101). 12/05/19   Roxan Hockey, MD  albuterol (PROVENTIL) (2.5 MG/3ML) 0.083% nebulizer solution Take 3 mLs (2.5 mg total) by nebulization every 6 (six) hours as needed for up to 7 days for wheezing or shortness of breath. 12/18/20   Marcello Fennel, PA-C  albuterol (VENTOLIN HFA) 108 (90 Base) MCG/ACT inhaler Inhale 2 puffs into the lungs every 6 (six) hours as needed for wheezing or shortness of breath. 06/03/20   Parrett, Fonnie Mu, NP  alfuzosin (UROXATRAL) 10 MG 24 hr tablet Take 10 mg by mouth daily. 03/02/21   [provider]  apixaban (ELIQUIS) 5 MG TABS tablet Take 1 tablet (5 mg total) by mouth 2 (two) times daily. For stroke prevention 02/21/21   Satira Sark, MD  B-D ULTRAFINE III SHORT PEN 31G X 8 MM MISC USE 1 PEN NEEDLE THREE TIMES DAILY 05/09/21   Brita Romp, NP  ezetimibe (ZETIA) 10 MG tablet Take 1 tablet (10 mg total) by mouth daily. 01/28/21   Luking, Elayne Snare, MD  FARXIGA 10 MG TABS tablet Take 10 mg by mouth daily. 05/03/21   [provider]  Fluticasone-Umeclidin-Vilant (TRELEGY ELLIPTA) 100-62.5-25 MCG/ACT  AEPB INHALE 1 PUFF INTO LUNGS ONCE DAILY 03/22/21   Chesley Mires, MD  furosemide (LASIX) 40 MG tablet Take by mouth. Take '40mg'$  daily alternating with '80mg'$  every other day. 09/09/20   [provider]  Glucagon (GVOKE HYPOPEN 2-PACK) 1 MG/0.2ML SOAJ Inject 1 mg subcutaneously once as needed for low blood sugar. May repeat dose in 15 minutes as needed using a new device. 01/28/21   Kathyrn Drown, MD  insulin regular human CONCENTRATED (HUMULIN R U-500 KWIKPEN) 500 UNIT/ML KwikPen Inject 65-85 Units into the skin 3 (three) times daily with meals. Inject 85 units with breakfast and lunch, and 65 units with supper if glucose is above 90 and you are eating 03/07/21   Brita Romp, NP  levothyroxine (SYNTHROID) 137 MCG tablet TAKE 1 TABLET BY MOUTH ONCE DAILY BEFORE BREAKFAST 06/02/21   Brita Romp, NP  loratadine (CLARITIN) 10 MG tablet Take 10 mg by mouth daily as needed for allergies.     [provider]  losartan (COZAAR) 100 MG tablet Take 100 mg by mouth daily. 11/12/20   [provider]  metFORMIN (GLUCOPHAGE) 500 MG tablet TAKE 1 TABLET BY MOUTH TWICE DAILY WITH MEALS 03/21/21   Brita Romp, NP  metoprolol succinate (TOPROL XL) 25 MG 24 hr tablet Take 1 tablet (25 mg total) by mouth See admin instructions. -Take  1 tablet (25 mg) along with an additional 50 mg tablet for total of 75 mg twice daily 04/06/21 04/06/22  Coral Spikes, DO  metoprolol succinate (TOPROL-XL) 50 MG 24 hr tablet Take 1 tablet (50 mg total) by mouth 2 (two) times daily. -Take 1 tablet (50 mg) along with an additional 25 mg tablet for total of 75 mg twice daily 04/06/21   Thersa Salt G, DO  modafinil (PROVIGIL) 200 MG tablet Take 1 tablet (200 mg total) by mouth daily. 12/01/20   Lomax, Amy, NP  MYRBETRIQ 50 MG TB24 tablet Take 50 mg by mouth daily. 11/22/20   [provider]  nitroGLYCERIN (NITROSTAT) 0.4 MG SL tablet DISSOLVE 1 TABLET UNDER TONGUE EVERY 5 MINUTES UP TO 15 MIN FOR  CHESTPAIN. IF NO RELIEF CALL 911. 04/05/20   Satira Sark, MD  ondansetron (ZOFRAN-ODT) 4 MG disintegrating tablet Take 1 tablet (4 mg total) by mouth every 8 (eight) hours as needed for nausea or vomiting. 03/11/21   Raul Del, Conner M, PA-C  ONE TOUCH ULTRA TEST test strip TEST BLOOD SUGAR UP TO 4 TIMES DAILY. 10/10/16   Cassandria Anger, MD  ONETOUCH DELICA LANCETS 61W MISC USE AS DIRECTED TO TEST BLOOD SUGAR 4 TIMES DAILY. 10/10/16   Cassandria Anger, MD  pravastatin (PRAVACHOL) 40 MG tablet TAKE 1 TABLET BY MOUTH ONCE DAILY IN THE EVENING 10/25/20   Satira Sark, MD  rOPINIRole (REQUIP XL) 4 MG 24 hr tablet Take 1 tablet (4 mg total) by mouth at bedtime. 06/01/21   Lomax, Amy, NP  rOPINIRole (REQUIP) 4 MG tablet Take 0.5 tablets (2 mg total) by mouth 2 (two) times daily. 06/01/21   Lomax, Amy, NP  Semaglutide, 1 MG/DOSE, 4 MG/3ML SOPN Inject 1 mg as directed once a week. 03/07/21   Brita Romp, NP      Allergies    Tape, Cardizem [diltiazem hcl], Cardura [doxazosin mesylate], Crestor [rosuvastatin], Lipitor [atorvastatin], Paxil [paroxetine hcl], Codeine, and Gabapentin    Review of Systems   Review of Systems  Physical Exam Updated Vital Signs BP 122/68   Pulse 82   Temp 98.4 F (36.9 C)   Resp 18   Ht '5\' 4"'$  (1.626 m)   Wt 98 kg   SpO2 96%   BMI 37.08 kg/m  Physical Exam Vitals and nursing note reviewed.  HENT:     Head: Atraumatic.  Cardiovascular:     Rate and Rhythm: Regular rhythm.  Abdominal:     Tenderness: There is no abdominal tenderness.  Musculoskeletal:        General: No tenderness.     Cervical back: Neck supple.  Skin:    General: Skin is warm.     Capillary Refill: Capillary refill takes less than 2 seconds.  Neurological:     Mental Status: He is alert and oriented to person, place, and time.    ED Results / Procedures / Treatments   Labs (all labs ordered are listed, but only abnormal results are displayed) Labs Reviewed   BASIC METABOLIC PANEL - Abnormal; Notable for the following components:      Result Value   Sodium 129 (*)    Chloride 94 (*)    Glucose, Bld 380 (*)    BUN 43 (*)    Creatinine, Ser 1.68 (*)    GFR, Estimated 44 (*)    All other components within normal limits  CBC - Abnormal; Notable for the following components:   Hemoglobin 12.8 (*)  HCT 38.8 (*)    All other components within normal limits  URINALYSIS, ROUTINE W REFLEX MICROSCOPIC - Abnormal; Notable for the following components:   Color, Urine STRAW (*)    Glucose, UA >=500 (*)    All other components within normal limits  CBG MONITORING, ED - Abnormal; Notable for the following components:   Glucose-Capillary 348 (*)    All other components within normal limits  CBG MONITORING, ED - Abnormal; Notable for the following components:   Glucose-Capillary 207 (*)    All other components within normal limits    EKG None  Radiology DG Chest Portable 1 View  Result Date: 06/03/2021 CLINICAL DATA:  Cough. EXAM: PORTABLE CHEST 1 VIEW COMPARISON:  Chest x-ray 03/11/2021 FINDINGS: The heart size and mediastinal contours are within normal limits. Both lungs are clear. The visualized skeletal structures are unremarkable. IMPRESSION: No active disease. Electronically Signed   By: Ronney Asters M.D.   On: 06/03/2021 23:09    Procedures Procedures    Medications Ordered in ED Medications  lactated ringers bolus 1,000 mL (0 mLs Intravenous Stopped 06/03/21 2300)  insulin aspart (novoLOG) injection 10 Units (10 Units Intravenous Given 06/03/21 2138)    ED Course/ Medical Decision Making/ A&P                           Medical Decision Making Amount and/or Complexity of Data Reviewed Labs: ordered. Radiology: ordered.  Risk Prescription drug management.   Patient presents with hypoglycemia.  Feeling dizzy.  Sugars been up to 500 at home.  Sugar elevated and creatinine mildly increased from baseline.  Not in DKA however.  Feels  better after IV fluids.  Chest x-ray done due to cough and reassuring.  Does not appear to require admission in the hospital.  Unsure why sugars increased but appears stable for discharge.        Final Clinical Impression(s) / ED Diagnoses Final diagnoses:  Hyperglycemia    Rx / DC Orders ED Discharge Orders     None         Davonna Belling, MD 06/04/21 0001

## 2021-06-03 NOTE — ED Triage Notes (Signed)
Pt c/o hyperglycemia since this morning. Pt states he has been taking all of his medications as prescribed. Pt states CBG at home over 500. CBG in triage 348. Pt also c/o dizziness.

## 2021-06-07 DIAGNOSIS — N182 Chronic kidney disease, stage 2 (mild): Secondary | ICD-10-CM | POA: Diagnosis not present

## 2021-06-07 DIAGNOSIS — N390 Urinary tract infection, site not specified: Secondary | ICD-10-CM | POA: Diagnosis not present

## 2021-06-09 ENCOUNTER — Ambulatory Visit: Payer: Medicare HMO | Admitting: Nurse Practitioner

## 2021-06-09 ENCOUNTER — Encounter: Payer: Self-pay | Admitting: Nurse Practitioner

## 2021-06-09 VITALS — BP 125/75 | HR 86 | Ht 64.0 in | Wt 204.0 lb

## 2021-06-09 DIAGNOSIS — E038 Other specified hypothyroidism: Secondary | ICD-10-CM | POA: Diagnosis not present

## 2021-06-09 DIAGNOSIS — I10 Essential (primary) hypertension: Secondary | ICD-10-CM | POA: Diagnosis not present

## 2021-06-09 DIAGNOSIS — E782 Mixed hyperlipidemia: Secondary | ICD-10-CM | POA: Diagnosis not present

## 2021-06-09 DIAGNOSIS — E1159 Type 2 diabetes mellitus with other circulatory complications: Secondary | ICD-10-CM | POA: Diagnosis not present

## 2021-06-09 LAB — POCT UA - MICROALBUMIN
Creatinine, POC: 50 mg/dL
Microalbumin Ur, POC: 10 mg/L

## 2021-06-09 LAB — POCT GLYCOSYLATED HEMOGLOBIN (HGB A1C): HbA1c POC (<> result, manual entry): 9.6 % (ref 4.0–5.6)

## 2021-06-09 NOTE — Patient Instructions (Signed)
Diabetes Mellitus and Foot Care Foot care is an important part of your health, especially when you have diabetes. Diabetes may cause you to have problems because of poor blood flow (circulation) to your feet and legs, which can cause your skin to: Become thinner and drier. Break more easily. Heal more slowly. Peel and crack. You may also have nerve damage (neuropathy) in your legs and feet, causing decreased feeling in them. This means that you may not notice minor injuries to your feet that could lead to more serious problems. Noticing and addressing any potential problems early is the best way to prevent future foot problems. How to care for your feet Foot hygiene  Wash your feet daily with warm water and mild soap. Do not use hot water. Then, pat your feet and the areas between your toes until they are completely dry. Do not soak your feet as this can dry your skin. Trim your toenails straight across. Do not dig under them or around the cuticle. File the edges of your nails with an emery board or nail file. Apply a moisturizing lotion or petroleum jelly to the skin on your feet and to dry, brittle toenails. Use lotion that does not contain alcohol and is unscented. Do not apply lotion between your toes. Shoes and socks Wear clean socks or stockings every day. Make sure they are not too tight. Do not wear knee-high stockings since they may decrease blood flow to your legs. Wear shoes that fit properly and have enough cushioning. Always look in your shoes before you put them on to be sure there are no objects inside. To break in new shoes, wear them for just a few hours a day. This prevents injuries on your feet. Wounds, scrapes, corns, and calluses  Check your feet daily for blisters, cuts, bruises, sores, and redness. If you cannot see the bottom of your feet, use a mirror or ask someone for help. Do not cut corns or calluses or try to remove them with medicine. If you find a minor scrape,  cut, or break in the skin on your feet, keep it and the skin around it clean and dry. You may clean these areas with mild soap and water. Do not clean the area with peroxide, alcohol, or iodine. If you have a wound, scrape, corn, or callus on your foot, look at it several times a day to make sure it is healing and not infected. Check for: Redness, swelling, or pain. Fluid or blood. Warmth. Pus or a bad smell. General tips Do not cross your legs. This may decrease blood flow to your feet. Do not use heating pads or hot water bottles on your feet. They may burn your skin. If you have lost feeling in your feet or legs, you may not know this is happening until it is too late. Protect your feet from hot and cold by wearing shoes, such as at the beach or on hot pavement. Schedule a complete foot exam at least once a year (annually) or more often if you have foot problems. Report any cuts, sores, or bruises to your health care provider immediately. Where to find more information American Diabetes Association: www.diabetes.org Association of [employer] Education Specialists: www.diabeteseducator.org Contact a health care provider if: You have a medical condition that increases your risk of infection and you have any cuts, sores, or bruises on your feet. You have an injury that is not healing. You have redness on your legs or feet. You   feel burning or tingling in your legs or feet. You have pain or cramps in your legs and feet. Your legs or feet are numb. Your feet always feel cold. You have pain around any toenails. Get help right away if: You have a wound, scrape, corn, or callus on your foot and: You have pain, swelling, or redness that gets worse. You have fluid or blood coming from the wound, scrape, corn, or callus. Your wound, scrape, corn, or callus feels warm to the touch. You have pus or a bad smell coming from the wound, scrape, corn, or callus. You have a fever. You have a red  line going up your leg. Summary Check your feet every day for blisters, cuts, bruises, sores, and redness. Apply a moisturizing lotion or petroleum jelly to the skin on your feet and to dry, brittle toenails. Wear shoes that fit properly and have enough cushioning. If you have foot problems, report any cuts, sores, or bruises to your health care provider immediately. Schedule a complete foot exam at least once a year (annually) or more often if you have foot problems. This information is not intended to replace advice given to you by your health care provider. Make sure you discuss any questions you have with your health care provider. Document Revised: 07/10/2019 Document Reviewed: 07/10/2019 [employer]vier Patient Education  2023 [employer]  

## 2021-06-09 NOTE — Progress Notes (Signed)
06/09/2021   Endocrinology follow-up note   Subjective:    Patient ID: Paul Baker, male    DOB: June 05, 1950, PCP Kathyrn Drown, MD   Past Medical History:  Diagnosis Date   Arthritis    Asthma    Atrial fibrillation [facility])    Diagnosed December 2021   Colon polyp    Coronary atherosclerosis of native coronary artery    a. 2011: cath showing 90% stenosis along small non-dominant RCA (too small for PCI). b. 01/2018: cath showing nonobstructive CAD with 60 to 70% proximal to mid nondominant RCA stenosis, 50% mid LAD and 40 to 50% OM1   DVT (deep venous thrombosis) ([place]) 2005   Right arm   Essential hypertension    Headache    History of transfusion    Hypothyroidism    Mixed hyperlipidemia    Morbid obesity ([place])    MRSA (methicillin resistant staph aureus) culture positive    08/2012   Narcolepsy    OSA (obstructive sleep apnea)    CPAP   Osteoarthritis    Pneumonia    Psoriasis    Pulmonary embolism ([place]) 2004   RLS (restless legs syndrome)    Rotator cuff disorder    Left   Septic arthritis of knee, left (HCC)    Skin cancer, basal cell    Type 2 diabetes mellitus ([place])    Past Surgical History:  Procedure Laterality Date   BACK SURGERY     CATARACT EXTRACTION Bilateral    COLONOSCOPY     CYST REMOVAL TRUNK     from back   EYE SURGERY Left 2016   laser to left eye   HIP SURGERY     bone removed from both sides of hip   KNEE ARTHROTOMY Right 12/04/2014   Procedure: KNEE ARTHROTOMY PATELLA LIGAMENT RECONSTRUSION AND REPAIR RIGHT KNEE;  Surgeon: Paralee Cancel, MD;  Location: [facility];  Service: Orthopedics;  Laterality: Right;   KNEE SURGERY     X 25 TIMES   LEFT HEART CATH AND CORONARY ANGIOGRAPHY N/A 01/17/2018   Procedure: LEFT HEART CATH AND CORONARY ANGIOGRAPHY;  Surgeon: Belva Crome, MD;  Location: [facility];  Service: Cardiovascular;  Laterality: N/A;   LUMBAR DISC SURGERY     Left L3, L4, L5 discecotomy with decompression of L4 root    TONSILLECTOMY     TOTAL KNEE ARTHROPLASTY  2003   LEFT   TOTAL KNEE ARTHROPLASTY Right 03/23/2014   Procedure: RIGHT TOTAL KNEE ARTHROPLASTY AND REMOVAL RIGHT TIBIAL  DEEP IMPLANT STAPLE;  Surgeon: Paralee Cancel, MD;  Location: [facility];  Service: Orthopedics;  Laterality: Right;   TOTAL KNEE REVISION  2005   LEFT   WRIST SURGERY     Social History   Socioeconomic History   Marital status: Married    Spouse name: Oleta   Number of children: 2   Years of education: college   Highest education level: Not on file  Occupational History   Occupation: Disabled    Employer: UNEMPLOYED  Tobacco Use   Smoking status: Former    Packs/day: 1.50    Years: 10.00    Total pack years: 15.00    Types: Cigarettes    Start date: 06/19/1967    Quit date: 01/03/1995    Years since quitting: 26.4   Smokeless tobacco: Never  Vaping Use   Vaping Use: Never used  Substance and Sexual Activity   Alcohol use: No    Alcohol/week: 0.0 standard drinks  of alcohol    Comment: quit drinking in 07/86   Drug use: No   Sexual activity: Yes    Partners: Female  Other Topics Concern   Not on file  Social History Narrative    71 year old, right-handed, caucasian male with a past medical history of obesity, hypertension, hyperlipidemia, diabetes, obstructive sleep apnea, presenting with frequent nighttime awakenings, excessive daytime sleepiness, also transient confusional episodes.RLS and one beosity, OSA on CPAP with AHI of 3.2 and  setting of 16 cm water , [facility] .   Married since 1977.   Social Determinants of Health   Financial Resource Strain: Low Risk  (01/18/2021)   Overall Financial Resource Strain (CARDIA)    Difficulty of Paying Living Expenses: Not hard at all  Food Insecurity: No Food Insecurity (01/18/2021)   Hunger Vital Sign    Worried About Running Out of Food in the Last Year: Never true    Ran Out of Food in the Last Year: Never true  Transportation Needs: No Transportation Needs  (01/18/2021)   PRAPARE - Hydrologist (Medical): No    Lack of Transportation (Non-Medical): No  Physical Activity: Sufficiently Active (01/18/2021)   Exercise Vital Sign    Days of Exercise per Week: 3 days    Minutes of Exercise per Session: 60 min  Stress: No Stress Concern Present (01/18/2021)   [place]    Feeling of Stress : Only a little  Social Connections: Socially Integrated (01/18/2021)   Social Connection and Isolation Panel [NHANES]    Frequency of Communication with Friends and Family: More than three times a week    Frequency of Social Gatherings with Friends and Family: More than three times a week    Attends Religious Services: More than 4 times per year    Active Member of [employer] or Organizations: Yes    Attends Archivist Meetings: More than 4 times per year    Marital Status: Married   Outpatient Encounter Medications as of 06/09/2021  Medication Sig   acetaminophen (TYLENOL) 325 MG tablet Take 2 tablets (650 mg total) by mouth every 6 (six) hours as needed for mild pain (or Fever >/= 101).   albuterol (PROVENTIL) (2.5 MG/3ML) 0.083% nebulizer solution Take 3 mLs (2.5 mg total) by nebulization every 6 (six) hours as needed for up to 7 days for wheezing or shortness of breath.   albuterol (VENTOLIN HFA) 108 (90 Base) MCG/ACT inhaler Inhale 2 puffs into the lungs every 6 (six) hours as needed for wheezing or shortness of breath.   alfuzosin (UROXATRAL) 10 MG 24 hr tablet Take 10 mg by mouth daily.   apixaban (ELIQUIS) 5 MG TABS tablet Take 1 tablet (5 mg total) by mouth 2 (two) times daily. For stroke prevention   B-D ULTRAFINE III SHORT PEN 31G X 8 MM MISC USE 1 PEN NEEDLE THREE TIMES DAILY   ezetimibe (ZETIA) 10 MG tablet Take 1 tablet (10 mg total) by mouth daily.   FARXIGA 10 MG TABS tablet Take 10 mg by mouth daily.   Fluticasone-Umeclidin-Vilant (TRELEGY ELLIPTA)  100-62.5-25 MCG/ACT AEPB INHALE 1 PUFF INTO LUNGS ONCE DAILY   furosemide (LASIX) 40 MG tablet Take by mouth. Take '40mg'$  daily alternating with '80mg'$  every other day.   Glucagon (GVOKE HYPOPEN 2-PACK) 1 MG/0.2ML SOAJ Inject 1 mg subcutaneously once as needed for low blood sugar. May repeat dose in 15 minutes as needed using a new  device.   insulin regular human CONCENTRATED (HUMULIN R U-500 KWIKPEN) 500 UNIT/ML KwikPen Inject 65-85 Units into the skin 3 (three) times daily with meals. Inject 85 units with breakfast and lunch, and 65 units with supper if glucose is above 90 and you are eating   levothyroxine (SYNTHROID) 137 MCG tablet TAKE 1 TABLET BY MOUTH ONCE DAILY BEFORE BREAKFAST   loratadine (CLARITIN) 10 MG tablet Take 10 mg by mouth daily as needed for allergies.    losartan (COZAAR) 100 MG tablet Take 100 mg by mouth daily.   metoprolol succinate (TOPROL XL) 25 MG 24 hr tablet Take 1 tablet (25 mg total) by mouth See admin instructions. -Take 1 tablet (25 mg) along with an additional 50 mg tablet for total of 75 mg twice daily   metoprolol succinate (TOPROL-XL) 50 MG 24 hr tablet Take 1 tablet (50 mg total) by mouth 2 (two) times daily. -Take 1 tablet (50 mg) along with an additional 25 mg tablet for total of 75 mg twice daily   modafinil (PROVIGIL) 200 MG tablet Take 1 tablet (200 mg total) by mouth daily.   MYRBETRIQ 50 MG TB24 tablet Take 50 mg by mouth daily.   nitroGLYCERIN (NITROSTAT) 0.4 MG SL tablet DISSOLVE 1 TABLET UNDER TONGUE EVERY 5 MINUTES UP TO 15 MIN FOR CHESTPAIN. IF NO RELIEF CALL 911.   ondansetron (ZOFRAN-ODT) 4 MG disintegrating tablet Take 1 tablet (4 mg total) by mouth every 8 (eight) hours as needed for nausea or vomiting.   ONE TOUCH ULTRA TEST test strip TEST BLOOD SUGAR UP TO 4 TIMES DAILY.   ONETOUCH DELICA LANCETS 32I MISC USE AS DIRECTED TO TEST BLOOD SUGAR 4 TIMES DAILY.   pravastatin (PRAVACHOL) 40 MG tablet TAKE 1 TABLET BY MOUTH ONCE DAILY IN THE EVENING    rOPINIRole (REQUIP XL) 4 MG 24 hr tablet Take 1 tablet (4 mg total) by mouth at bedtime.   rOPINIRole (REQUIP) 4 MG tablet Take 0.5 tablets (2 mg total) by mouth 2 (two) times daily.   Semaglutide, 1 MG/DOSE, 4 MG/3ML SOPN Inject 1 mg as directed once a week.   [DISCONTINUED] metFORMIN (GLUCOPHAGE) 500 MG tablet TAKE 1 TABLET BY MOUTH TWICE DAILY WITH MEALS   No facility-administered encounter medications on file as of 06/09/2021.   ALLERGIES: Allergies  Allergen Reactions   Tape Rash    Pulls off skin   Cardizem [Diltiazem Hcl]     Edema    Cardura [Doxazosin Mesylate]     Headaches / cramps   Crestor [Rosuvastatin] Other (See Comments)    Leg cramps   Lipitor [Atorvastatin] Other (See Comments)    Leg cramps   Paxil [Paroxetine Hcl]     Unknown reaction    Codeine Rash and Other (See Comments)    Headache    Gabapentin Rash   VACCINATION STATUS: Immunization History  Administered Date(s) Administered   Influenza Split 09/18/2012   Influenza,inj,Quad PF,6+ Mos 10/13/2013, 10/06/2014, 09/11/2018   Influenza-Unspecified 12/01/2015, 11/08/2016, 11/02/2020   PFIZER(Purple Top)SARS-COV-2 Vaccination 03/06/2019, 03/27/2019   Pneumococcal Polysaccharide-23 10/02/2004, 10/03/2011   Td 05/24/2006    Diabetes He presents for his follow-up diabetic visit. He has type 2 diabetes mellitus. Onset time: Was diagnosed at approximate age of 71 years. His disease course has been fluctuating. There are no hypoglycemic associated symptoms. Pertinent negatives for hypoglycemia include no confusion, headaches, nervousness/anxiousness, pallor, seizures or tremors. Associated symptoms include fatigue, foot paresthesias and weight loss. Pertinent negatives for diabetes include no chest pain,  no polydipsia, no polyphagia, no polyuria and no weakness. There are no hypoglycemic complications. Symptoms are stable. Diabetic complications include a CVA, heart disease, nephropathy and peripheral neuropathy.  (Has had 2 mini strokes ) Risk factors for coronary artery disease include diabetes mellitus, dyslipidemia, male sex, obesity, sedentary lifestyle, tobacco exposure and hypertension. Current diabetic treatment includes oral agent (dual therapy), intensive insulin program and oral agent (monotherapy) (and Ozempic). He is compliant with treatment most of the time. His weight is decreasing steadily. He is following a generally healthy diet. When asked about meal planning, he reported none. He has had a previous visit with a dietitian. He never participates in exercise. His home blood glucose trend is decreasing steadily. His breakfast blood glucose range is generally >200 mg/dl. His lunch blood glucose range is generally >200 mg/dl. His dinner blood glucose range is generally >200 mg/dl. His bedtime blood glucose range is generally >200 mg/dl. His overall blood glucose range is >200 mg/dl. (He presents today with his CGM, no logs, showing above target glycemic profile overall.  His POCT A1c today is 9.6%, increasing from 8.8%.  He has been dealing with a stomach bug over the last 2 weeks or so, subsequently causing gross hyperglycemia requiring attention at the ED.  His glucose is trending back to normal now that he is feeling better.  His nephrologist also started him on Farxiga in place of Metformin at their last visit.  So far, he does note he is urinating more frequently, especially at night.  Analysis of his CGM shows TIR 32%, TAR 67%, TBR < 1% with a GMI of 9.1%. ) An ACE inhibitor/angiotensin II receptor blocker is being taken. He sees a podiatrist.Eye exam is current.  Hyperlipidemia This is a chronic problem. The current episode started more than 1 year ago. The problem is uncontrolled. Recent lipid tests were reviewed and are variable. Exacerbating diseases include chronic renal disease, diabetes, hypothyroidism and obesity. Factors aggravating his hyperlipidemia include fatty foods. Pertinent negatives  include no chest pain, myalgias or shortness of breath. Current antihyperlipidemic treatment includes statins. The current treatment provides mild improvement of lipids. Compliance problems include adherence to diet and adherence to exercise.  Risk factors for coronary artery disease include dyslipidemia, diabetes mellitus, hypertension, male sex, obesity and a sedentary lifestyle.  Hypertension This is a chronic problem. The current episode started more than 1 year ago. The problem has been resolved since onset. The problem is controlled. Pertinent negatives include no chest pain, headaches, neck pain, palpitations or shortness of breath. Agents associated with hypertension include thyroid hormones. Risk factors for coronary artery disease include diabetes mellitus, dyslipidemia, male gender, obesity, sedentary lifestyle and smoking/tobacco exposure. Past treatments include angiotensin blockers, beta blockers and diuretics. Compliance problems include exercise and diet.  Hypertensive end-organ damage includes kidney disease, CAD/MI and CVA. Identifiable causes of hypertension include chronic renal disease and a thyroid problem.  Thyroid Problem Presents for follow-up visit. Symptoms include fatigue, leg swelling and weight loss. Patient reports no anxiety, cold intolerance, constipation, depressed mood, diarrhea, heat intolerance, palpitations, tremors or weight gain. The symptoms have been stable. Past treatments include levothyroxine. His past medical history is significant for diabetes and hyperlipidemia.     Review of systems  Constitutional: + steadily decreasing weight-recent stomach bug,  current Body mass index is 35.02 kg/m. , no fatigue, no subjective hyperthermia, no subjective hypothermia, reports mild memory impairment since mini strokes Eyes: no blurry vision, no xerophthalmia ENT: no sore throat, no nodules palpated in  throat, no dysphagia/odynophagia, no hoarseness Cardiovascular: no  chest pain, no shortness of breath, no palpitations,  Respiratory: no cough, no shortness of breath Gastrointestinal: no nausea/vomiting/diarrhea Musculoskeletal: no muscle/joint aches Skin: no rashes, no hyperemia Neurological: no tremors, no numbness, no tingling, no dizziness, + intermittent numbness/tingling to bilateral feet Psychiatric: no depression, no anxiety    Objective:    BP 125/75   Pulse 86   Ht '5\' 4"'$  (1.626 m)   Wt 204 lb (92.5 kg)   BMI 35.02 kg/m   Wt Readings from Last 3 Encounters:  06/09/21 204 lb (92.5 kg)  06/03/21 216 lb (98 kg)  06/01/21 216 lb (98 kg)     BP Readings from Last 3 Encounters:  06/09/21 125/75  06/03/21 122/68  06/01/21 (!) 144/79     Physical Exam- Limited  Constitutional:  Body mass index is 35.02 kg/m. , not in acute distress, normal state of mind Eyes:  EOMI, no exophthalmos Neck: Supple Cardiovascular: Irregular (hx afib), no murmurs, rubs, or gallops, no edema Respiratory: Adequate breathing efforts, no crackles, rales, rhonchi, or wheezing Musculoskeletal: no gross deformities, strength intact in all four extremities, no gross restriction of joint movements Skin:  no rashes, no hyperemia Neurological: no tremor with outstretched hands   Diabetic Foot Exam - Simple   Simple Foot Form Visual Inspection No deformities, no ulcerations, no other skin breakdown bilaterally: Yes Sensation Testing See comments: Yes Pulse Check Posterior Tibialis and Dorsalis pulse intact bilaterally: Yes Comments Decreased sensation to monofilament tool bilaterally        Latest Ref Rng & Units 06/03/2021    7:36 PM 03/11/2021    3:20 PM 12/18/2020    2:56 PM  CMP  Glucose 70 - 99 mg/dL 380  269  324   BUN 8 - 23 mg/dL 43  33  32   Creatinine 0.61 - 1.24 mg/dL 1.68  1.29  1.13   Sodium 135 - 145 mmol/L 129  137  136   Potassium 3.5 - 5.1 mmol/L 4.1  3.8  4.1   Chloride 98 - 111 mmol/L 94  101  99   CO2 22 - 32 mmol/L '23  25  26    '$ Calcium 8.9 - 10.3 mg/dL 9.4  8.9  9.3      Diabetic Labs (most recent): Lab Results  Component Value Date   HGBA1C 9.6 06/09/2021   HGBA1C 8.8 (A) 03/07/2021   HGBA1C 10.0 11/12/2020   MICROALBUR 10 06/09/2021   MICROALBUR 9.5 02/06/2019   MICROALBUR 4.6 10/08/2017   Lipid Panel     Component Value Date/Time   CHOL 166 04/05/2021 0819   TRIG 226 (H) 04/05/2021 0819   HDL 55 04/05/2021 0819   CHOLHDL 3.0 04/05/2021 0819   CHOLHDL 3.2 08/05/2019 0334   VLDL 19 08/05/2019 0334   LDLCALC 74 04/05/2021 0819   LDLCALC 118 (H) 02/06/2019 0813     Assessment & Plan:   1) Uncontrolled type 2 diabetes mellitus with circulatory complication, with long-term current use of insulin (HCC)  -His diabetes is complicated by coronary artery disease and patient remains at an extremely high risk for more acute and chronic complications of diabetes which include CAD, CVA, CKD, retinopathy, and neuropathy. These are all discussed in detail with the patient.  He presents today with his CGM, no logs, showing improving glycemic profile with tight overnight readings and high morning readings as a result.  His POCT A1c today is 8.8%, improving from last visit of 10%.  He reports he has tolerated the Ozempic well and can see a big difference in his glucose readings.  Analysis of his CGM shows TIR 35%, TAR 62%, TBR 3% with a GMI of 8.3%.     Glucose logs and insulin administration records pertaining to this visit,  to be scanned into patient's records.  Recent labs reviewed.  - Nutritional counseling repeated at each appointment due to patients tendency to fall back in to old habits.  - The patient admits there is a room for improvement in their diet and drink choices. -  Suggestion is made for the patient to avoid simple carbohydrates from their diet including Cakes, Sweet Desserts / Pastries, Ice Cream, Soda (diet and regular), Sweet Tea, Candies, Chips, Cookies, Sweet Pastries, Store Bought Juices,  Alcohol in Excess of 1-2 drinks a day, Artificial Sweeteners, Coffee Creamer, and "Sugar-free" Products. This will help patient to have stable blood glucose profile and potentially avoid unintended weight gain.   - I encouraged the patient to switch to unprocessed or minimally processed complex starch and increased protein intake (animal or plant source), fruits, and vegetables.   - Patient is advised to stick to a routine mealtimes to eat 3 meals a day and avoid unnecessary snacks (to snack only to correct hypoglycemia).  - I have approached patient with the following individualized plan to manage diabetes and patient agrees.  -He will continue to need intensive treatment with higher dose of insulin.  He has been better with U500 versus [place] and regular insulin.     -No changes will be made to his medications today now that his glucose is improving.  He is advised to continue Humulin R U500 85 units with breakfast and lunch and 65 units with supper if glucose is above 90 and he is eating.  He can continue his Ozempic 1 mg SQ weekly and Farxiga 10 mg po daily per nephrology.   -We did discuss potentially adding the Omnipod DASH to help manage his glucose given his variations.  He also mentioned that he is interested in bariatric surgery.  I recommended he reach out to [facility] for a consultation.  -He is encouraged to continue using his CGM to monitor glucose 4 times daily, before meals and before bed, and to call the clinic if he has readings less than 70 or greater than 300 for 3 tests in a row.  2) BP/HTN:  His blood pressure is controlled to target.  He is advised to continue Lasix 40 mg po daily, Losartan 50 mg po daily, Norvasc 5 mg po daily, and Metoprolol 75 mg po twice daily.  3) Lipids/HPL:  His most recent lipid panel from 04/05/21 shows controlled LDL of 74 (improving) and elevated triglycerides of 226.  He is advised to continue Pravastatin 40 mg po daily at bedtime.   Side effects and precautions discussed with him.    4)  Weight/Diet:  His Body mass index is 99.83 kg/m.-complicating his diabetes care.  He is a candidate for modest weight loss.  CDE consult in progress, exercise, and carbohydrates information provided.  5) Hypothyroidism -There are no recent TFTs to review.  He is advised to continue current dose of Levothyroxine at 137 mcg po daily before breakfast.     - We discussed about the correct intake of his thyroid hormone, on empty stomach at fasting, with water, separated by at least 30 minutes from breakfast and other medications,  and separated by more than 4 hours from  calcium, iron, multivitamins, acid reflux medications (PPIs). -Patient is made aware of the fact that thyroid hormone replacement is needed for life, dose to be adjusted by periodic monitoring of thyroid function tests.  6) Chronic Care/Health Maintenance: -Patient is on ACEI/ARB and Statin medications and encouraged to continue to follow up with Ophthalmology, Podiatrist at least yearly or according to recommendations, and advised to  stay away from smoking. I have recommended yearly flu vaccine and pneumonia vaccination at least every 5 years; moderate intensity exercise for up to 150 minutes weekly; and  sleep for at least 7 hours a day.  - I advised patient to maintain close follow up with Kathyrn Drown, MD for primary care needs.         I spent 41 minutes in the care of the patient today including review of labs from [facility], Lipids, Thyroid Function, Hematology (current and previous including abstractions from other facilities); face-to-face time discussing  his blood glucose readings/logs, discussing hypoglycemia and hyperglycemia episodes and symptoms, medications doses, his options of short and long term treatment based on the latest standards of care / guidelines;  discussion about incorporating lifestyle medicine;  and documenting the encounter.    Please refer to  Patient Instructions for Blood Glucose Monitoring and Insulin/Medications Dosing Guide"  in media tab for additional information. Please  also refer to " Patient Self Inventory" in the Media  tab for reviewed elements of pertinent patient history.  Paul Baker participated in the discussions, expressed understanding, and voiced agreement with the above plans.  All questions were answered to his satisfaction. he is encouraged to contact clinic should he have any questions or concerns prior to his return visit.   Follow up plan: -Return in about 3 months (around 09/09/2021) for Diabetes F/U with A1c in office, No previsit labs, Bring meter and logs.  Rayetta Pigg, [facility] [address] [place], [place] [address] Phone: [phone] Fax: [phone]  06/09/2021, 7:37 PM

## 2021-06-10 DIAGNOSIS — M179 Osteoarthritis of knee, unspecified: Secondary | ICD-10-CM | POA: Diagnosis not present

## 2021-06-10 DIAGNOSIS — G4733 Obstructive sleep apnea (adult) (pediatric): Secondary | ICD-10-CM | POA: Diagnosis not present

## 2021-06-18 DIAGNOSIS — E1110 Type 2 diabetes mellitus with ketoacidosis without coma: Secondary | ICD-10-CM | POA: Diagnosis not present

## 2021-06-23 ENCOUNTER — Ambulatory Visit (HOSPITAL_COMMUNITY)
Admission: RE | Admit: 2021-06-23 | Discharge: 2021-06-23 | Disposition: A | Discharge status: Home / Self Care | Payer: Medicare HMO | Source: Ambulatory Visit | Attending: Nurse Practitioner | Admitting: Nurse Practitioner

## 2021-06-23 ENCOUNTER — Encounter: Payer: Self-pay | Admitting: Nurse Practitioner

## 2021-06-23 ENCOUNTER — Ambulatory Visit (INDEPENDENT_AMBULATORY_CARE_PROVIDER_SITE_OTHER): Payer: Medicare HMO | Admitting: Nurse Practitioner

## 2021-06-23 VITALS — BP 142/79 | HR 95 | Temp 97.7°F | Wt 215.8 lb

## 2021-06-23 DIAGNOSIS — R059 Cough, unspecified: Secondary | ICD-10-CM | POA: Diagnosis not present

## 2021-06-23 DIAGNOSIS — J310 Chronic rhinitis: Secondary | ICD-10-CM | POA: Diagnosis not present

## 2021-06-23 DIAGNOSIS — R051 Acute cough: Secondary | ICD-10-CM | POA: Insufficient documentation

## 2021-06-23 DIAGNOSIS — J329 Chronic sinusitis, unspecified: Secondary | ICD-10-CM | POA: Diagnosis not present

## 2021-06-23 MED ORDER — AMOXICILLIN 500 MG PO CAPS
1000.0000 mg | ORAL_CAPSULE | Freq: Three times a day (TID) | ORAL | 0 refills | Status: AC
Start: 2021-06-23 — End: 2021-06-28

## 2021-06-23 NOTE — Progress Notes (Signed)
Subjective:    Patient ID: Paul Baker, male    DOB: 11-19-1950, 71 y.o.   MRN: [id number]  HPI  71 year old male patient with extensive medical history that includes A-fib, pulmonary embolism, DVT, angina, asthma, CAD, uncontrolled diabetes, hypertension presents to the clinic today with bad cough x7 days. Pt states cough began last Friday.  Patient states that his cough is productive with yellow phlegm.  Patient denies nasal congestion, fever, body aches, chills, leg swelling, shortness of breath, chest pain, increased albuterol use.  States that he continues to use his Ellipta as scheduled which helps.  States that he did at 1 point have a sore throat however he does not have one today.    Patient does admit that his blood sugar is continuing to be uncontrolled.  Patient reports a morning blood sugar this morning at 400.  Patient states that he is taking all his diabetic medication as prescribed.  Patient saw endocrinology on June 8.  Patient currently on Ozempic, regular insulin.  Patient states that he has stopped his Wilder Glade due to side effects.   Review of Systems  Respiratory:  Positive for cough.   All other systems reviewed and are negative.     Objective:   Physical Exam Vitals reviewed.  Constitutional:      General: He is not in acute distress.    Appearance: Normal appearance. He is obese. He is not ill-appearing, toxic-appearing or diaphoretic.  HENT:     Head: Normocephalic and atraumatic.     Right Ear: Tympanic membrane, ear canal and external ear normal. There is no impacted cerumen.     Left Ear: Tympanic membrane, ear canal and external ear normal. There is no impacted cerumen.     Nose: Rhinorrhea present. No congestion.     Mouth/Throat:     Mouth: Mucous membranes are moist.     Pharynx: Oropharynx is clear. No oropharyngeal exudate or posterior oropharyngeal erythema.  Eyes:     General:        Right eye: No discharge.        Left eye: No discharge.      Extraocular Movements: Extraocular movements intact.     Conjunctiva/sclera: Conjunctivae normal.     Pupils: Pupils are equal, round, and reactive to light.  Cardiovascular:     Rate and Rhythm: Normal rate and regular rhythm.     Pulses: Normal pulses.     Heart sounds: Normal heart sounds. No murmur heard. Pulmonary:     Effort: Pulmonary effort is normal. No respiratory distress.     Breath sounds: Rales present. No wheezing.     Comments: Fine rales noted to left upper lobe but cleared with cough Abdominal:     General: Abdomen is flat. Bowel sounds are normal.     Palpations: Abdomen is soft.  Musculoskeletal:     Cervical back: Normal range of motion and neck supple. No rigidity or tenderness.     Comments: Grossly intact  Lymphadenopathy:     Cervical: No cervical adenopathy.  Skin:    General: Skin is warm.     Capillary Refill: Capillary refill takes less than 2 seconds.  Neurological:     Mental Status: He is alert.     Comments: Grossly intact  Psychiatric:        Mood and Affect: Mood normal.        Behavior: Behavior normal.           Assessment & Plan:  1. Rhinosinusitis - Suspect pneumonia -Will get x-ray and treat patient with high-dose amoxicillin to cover both pneumonia and rhinosinusitis. -Considered steroid use, however due to patient having uncontrolled blood sugars steroids not a prudent choice - DG Chest 2 View - amoxicillin (AMOXIL) 500 MG capsule; Take 2 capsules (1,000 mg total) by mouth 3 (three) times daily for 5 days.  Dispense: 30 capsule; Refill: 0 -Return to clinic on Monday for evaluation -Go to the emergency room if symptoms worsen over the weekend.    Note:  This document was prepared using Dragon voice recognition software and may include unintentional dictation errors. Note - This record has been created using [employer].  Chart creation errors have been sought, but may not always  have been located. Such creation errors  do not reflect on  the standard of medical care.

## 2021-06-28 ENCOUNTER — Encounter: Payer: Self-pay | Admitting: Cardiology

## 2021-06-28 ENCOUNTER — Other Ambulatory Visit: Payer: Self-pay | Admitting: *Deleted

## 2021-06-28 ENCOUNTER — Ambulatory Visit: Payer: Medicare HMO | Admitting: Cardiology

## 2021-06-28 VITALS — BP 140/84 | HR 80 | Ht 64.0 in | Wt 216.8 lb

## 2021-06-28 DIAGNOSIS — E782 Mixed hyperlipidemia: Secondary | ICD-10-CM

## 2021-06-28 DIAGNOSIS — I25119 Atherosclerotic heart disease of native coronary artery with unspecified angina pectoris: Secondary | ICD-10-CM | POA: Diagnosis not present

## 2021-06-28 DIAGNOSIS — I480 Paroxysmal atrial fibrillation: Secondary | ICD-10-CM

## 2021-06-28 MED ORDER — METOPROLOL SUCCINATE ER 25 MG PO TB24
25.0000 mg | ORAL_TABLET | Freq: Every day | ORAL | 0 refills | Status: DC | PRN
Start: 2021-06-28 — End: 2022-04-07

## 2021-06-28 MED ORDER — APIXABAN 5 MG PO TABS: 5.0000 mg | ORAL_TABLET | Freq: Two times a day (BID) | ORAL | 3 refills | Status: DC

## 2021-06-28 MED ORDER — METOPROLOL SUCCINATE ER 100 MG PO TB24: 100.0000 mg | ORAL_TABLET | Freq: Two times a day (BID) | ORAL | 1 refills | Status: DC

## 2021-06-30 ENCOUNTER — Encounter: Payer: Self-pay | Admitting: *Deleted

## 2021-06-30 NOTE — Progress Notes (Signed)
Fax letter notification received that Eliquis 5 mg twice daily approved through [employer] 06/30/2021 through 01/01/2022.

## 2021-07-07 DIAGNOSIS — G4733 Obstructive sleep apnea (adult) (pediatric): Secondary | ICD-10-CM | POA: Diagnosis not present

## 2021-07-07 DIAGNOSIS — M179 Osteoarthritis of knee, unspecified: Secondary | ICD-10-CM | POA: Diagnosis not present

## 2021-07-10 DIAGNOSIS — G4733 Obstructive sleep apnea (adult) (pediatric): Secondary | ICD-10-CM | POA: Diagnosis not present

## 2021-07-10 DIAGNOSIS — M179 Osteoarthritis of knee, unspecified: Secondary | ICD-10-CM | POA: Diagnosis not present

## 2021-07-12 ENCOUNTER — Other Ambulatory Visit: Payer: Self-pay

## 2021-07-12 MED ORDER — MODAFINIL 200 MG PO TABS: 200.0000 mg | ORAL_TABLET | Freq: Every day | ORAL | 1 refills | Status: DC

## 2021-07-12 NOTE — Telephone Encounter (Signed)
Last OV was on 06/01/21.  Next OV is scheduled for 11/30.  Last RX was written on 03/19/21 for 90 tabs.   [facility] has been reviewed.

## 2021-07-18 DIAGNOSIS — E1110 Type 2 diabetes mellitus with ketoacidosis without coma: Secondary | ICD-10-CM | POA: Diagnosis not present

## 2021-07-31 ENCOUNTER — Other Ambulatory Visit: Payer: Self-pay | Admitting: Cardiology

## 2021-08-07 ENCOUNTER — Other Ambulatory Visit: Payer: Self-pay | Admitting: Nurse Practitioner

## 2021-08-07 DIAGNOSIS — E1159 Type 2 diabetes mellitus with other circulatory complications: Secondary | ICD-10-CM

## 2021-08-10 DIAGNOSIS — G4733 Obstructive sleep apnea (adult) (pediatric): Secondary | ICD-10-CM | POA: Diagnosis not present

## 2021-08-10 DIAGNOSIS — M179 Osteoarthritis of knee, unspecified: Secondary | ICD-10-CM | POA: Diagnosis not present

## 2021-08-17 DIAGNOSIS — E1110 Type 2 diabetes mellitus with ketoacidosis without coma: Secondary | ICD-10-CM | POA: Diagnosis not present

## 2021-08-23 DIAGNOSIS — I4891 Unspecified atrial fibrillation: Secondary | ICD-10-CM | POA: Diagnosis not present

## 2021-08-23 DIAGNOSIS — N182 Chronic kidney disease, stage 2 (mild): Secondary | ICD-10-CM | POA: Diagnosis not present

## 2021-08-23 DIAGNOSIS — R609 Edema, unspecified: Secondary | ICD-10-CM | POA: Diagnosis not present

## 2021-08-23 DIAGNOSIS — Z6836 Body mass index (BMI) 36.0-36.9, adult: Secondary | ICD-10-CM | POA: Diagnosis not present

## 2021-08-23 DIAGNOSIS — E1122 Type 2 diabetes mellitus with diabetic chronic kidney disease: Secondary | ICD-10-CM | POA: Diagnosis not present

## 2021-08-23 DIAGNOSIS — I129 Hypertensive chronic kidney disease with stage 1 through stage 4 chronic kidney disease, or unspecified chronic kidney disease: Secondary | ICD-10-CM | POA: Diagnosis not present

## 2021-08-23 DIAGNOSIS — E1129 Type 2 diabetes mellitus with other diabetic kidney complication: Secondary | ICD-10-CM | POA: Diagnosis not present

## 2021-08-23 DIAGNOSIS — R42 Dizziness and giddiness: Secondary | ICD-10-CM | POA: Diagnosis not present

## 2021-08-23 LAB — HEMOGLOBIN A1C: Hemoglobin A1C: 10.6

## 2021-08-24 ENCOUNTER — Telehealth: Payer: Self-pay | Admitting: Nurse Practitioner

## 2021-08-24 NOTE — Telephone Encounter (Signed)
Left VM for pt that his Ozempic Patient Assistance is here ready for pick up.

## 2021-08-25 NOTE — Telephone Encounter (Signed)
Pt picked it up

## 2021-08-29 ENCOUNTER — Telehealth: Payer: Self-pay | Admitting: Neurology

## 2021-08-29 NOTE — Telephone Encounter (Signed)
Called Pt to rescheduled appointment for 12/01/2021. Left message to call office to reschedule.

## 2021-09-01 ENCOUNTER — Encounter: Payer: Self-pay | Admitting: Nurse Practitioner

## 2021-09-01 ENCOUNTER — Ambulatory Visit (INDEPENDENT_AMBULATORY_CARE_PROVIDER_SITE_OTHER): Payer: Medicare HMO | Admitting: Nurse Practitioner

## 2021-09-01 VITALS — BP 118/70 | HR 66 | Temp 98.2°F | Ht 64.0 in | Wt 222.6 lb

## 2021-09-01 DIAGNOSIS — R197 Diarrhea, unspecified: Secondary | ICD-10-CM | POA: Diagnosis not present

## 2021-09-01 NOTE — Progress Notes (Signed)
   Subjective:    Patient ID: Paul Baker, male    DOB: 20-Dec-1950, 71 y.o.   MRN: [id number]  HPI  71 year old male patient with history of coronary artery disease, A-fib, uncontrolled type 2 diabetes asthma, morbid obesity presents to the clinic for diarrhea x 3 days, stomach pain.  Patient states that he was having diarrhea every 15 to 20 minutes over the weekend however now it started to ease up.  Patient also admits to having a headache, a dry cough for months with phlegm.  Patient also admits to having intermittent lightheadedness over the past 7 days.  Patient denies any blood in stool, mucus in stool, nausea, vomiting, fever, chills, body aches, sore throat, difficulty breathing, wheezing, chest tightness.  Patient denies eating anything new or eating in any new places or not having any new foods.  Patient denies any recent travel.  Patient has been taking Pepto-Bismol without relief.  Patient states that that he was on Ozempic but has since stopped taking Ozempic 2 to 3 weeks ago.  Patient states that his fasting blood glucose close since then has been up to 400 and that his postprandial glucoses have been around 262.  Patient states that he does have cardiology appointment and endocrinology appointment soon.  Review of Systems  Gastrointestinal:  Positive for abdominal pain.  Neurological:  Positive for light-headedness and headaches.  All other systems reviewed and are negative.      Objective:   Physical Exam Vitals reviewed.  Constitutional:      General: He is not in acute distress.    Appearance: Normal appearance. He is normal weight. He is not ill-appearing, toxic-appearing or diaphoretic.  HENT:     Head: Normocephalic and atraumatic.  Cardiovascular:     Rate and Rhythm: Normal rate and regular rhythm.     Pulses: Normal pulses.     Heart sounds: Normal heart sounds. No murmur heard. Pulmonary:     Effort: Pulmonary effort is normal. No respiratory distress.      Breath sounds: Normal breath sounds. No wheezing.  Abdominal:     General: Bowel sounds are normal. There is no distension.     Palpations: Abdomen is soft.     Tenderness: There is generalized abdominal tenderness. There is no right CVA tenderness, left CVA tenderness, guarding or rebound. Negative signs include Murphy's sign, Rovsing's sign, McBurney's sign and psoas sign.     Hernia: No hernia is present.  Musculoskeletal:     Cervical back: Normal range of motion and neck supple. No rigidity or tenderness.     Comments: Grossly intact  Lymphadenopathy:     Cervical: No cervical adenopathy.  Skin:    General: Skin is warm.     Capillary Refill: Capillary refill takes less than 2 seconds.  Neurological:     Mental Status: He is alert.     Comments: Grossly intact  Psychiatric:        Mood and Affect: Mood normal.        Behavior: Behavior normal.           Assessment & Plan:   1. Diarrhea, unspecified type -Suspect viral illness -However will assess pancreatic, biliary, infectious causes with lab work - CMP14+EGFR - Amylase - Lipase - CBC with Differential/Platelet -Encourage patient to remain hydrated and well-nourished -If symptoms worsen over holiday weekend go to the emergency room to be evaluated -Return to clinic if symptoms do not improve or if they worsen

## 2021-09-01 NOTE — Progress Notes (Signed)
Diarrhea x 3 days,stomach pain,

## 2021-09-02 ENCOUNTER — Encounter: Payer: Self-pay | Admitting: Family Medicine

## 2021-09-02 LAB — CMP14+EGFR
ALT: 13 IU/L (ref 0–44)
AST: 17 IU/L (ref 0–40)
Albumin/Globulin Ratio: 1.8 (ref 1.2–2.2)
Albumin: 4.5 g/dL (ref 3.9–4.9)
Alkaline Phosphatase: 74 IU/L (ref 44–121)
BUN/Creatinine Ratio: 22 (ref 10–24)
BUN: 24 mg/dL (ref 8–27)
Bilirubin Total: <0.2 mg/dL (ref 0.0–1.2)
CO2: 24 mmol/L (ref 20–29)
Calcium: 9.9 mg/dL (ref 8.6–10.2)
Chloride: 100 mmol/L (ref 96–106)
Creatinine, Ser: 1.1 mg/dL (ref 0.76–1.27)
Globulin, Total: 2.5 g/dL (ref 1.5–4.5)
Glucose: 214 mg/dL — ABNORMAL HIGH (ref 70–99)
Potassium: 4.4 mmol/L (ref 3.5–5.2)
Sodium: 141 mmol/L (ref 134–144)
Total Protein: 7 g/dL (ref 6.0–8.5)
eGFR: 72 mL/min/{1.73_m2} (ref >59)

## 2021-09-02 LAB — CBC WITH DIFFERENTIAL/PLATELET
Basophils Absolute: 0.1 10*3/uL (ref 0.0–0.2)
Basos: 1 %
EOS (ABSOLUTE): 0.3 10*3/uL (ref 0.0–0.4)
Eos: 4 %
Hematocrit: 36.1 % — ABNORMAL LOW (ref 37.5–51.0)
Hemoglobin: 12.1 g/dL — ABNORMAL LOW (ref 13.0–17.7)
Immature Grans (Abs): 0 10*3/uL (ref 0.0–0.1)
Immature Granulocytes: 1 %
Lymphocytes Absolute: 1.6 10*3/uL (ref 0.7–3.1)
Lymphs: 20 %
MCH: 30.1 pg (ref 26.6–33.0)
MCHC: 33.5 g/dL (ref 31.5–35.7)
MCV: 90 fL (ref 79–97)
Monocytes Absolute: 0.9 10*3/uL (ref 0.1–0.9)
Monocytes: 12 %
Neutrophils Absolute: 4.9 10*3/uL (ref 1.4–7.0)
Neutrophils: 62 %
Platelets: 224 10*3/uL (ref 150–450)
RBC: 4.02 x10E6/uL — ABNORMAL LOW (ref 4.14–5.80)
RDW: 13.4 % (ref 11.6–15.4)
WBC: 7.8 10*3/uL (ref 3.4–10.8)

## 2021-09-02 LAB — AMYLASE: Amylase: 57 U/L (ref 31–110)

## 2021-09-02 LAB — LIPASE: Lipase: 51 U/L (ref 13–78)

## 2021-09-05 ENCOUNTER — Encounter: Payer: Self-pay | Admitting: Nurse Practitioner

## 2021-09-09 ENCOUNTER — Ambulatory Visit: Payer: Medicare HMO | Admitting: Nurse Practitioner

## 2021-09-09 ENCOUNTER — Encounter: Payer: Self-pay | Admitting: Nurse Practitioner

## 2021-09-09 VITALS — BP 137/79 | HR 71 | Ht 64.0 in | Wt 222.6 lb

## 2021-09-09 DIAGNOSIS — E038 Other specified hypothyroidism: Secondary | ICD-10-CM

## 2021-09-09 DIAGNOSIS — E782 Mixed hyperlipidemia: Secondary | ICD-10-CM | POA: Diagnosis not present

## 2021-09-09 DIAGNOSIS — I10 Essential (primary) hypertension: Secondary | ICD-10-CM

## 2021-09-09 DIAGNOSIS — E1159 Type 2 diabetes mellitus with other circulatory complications: Secondary | ICD-10-CM

## 2021-09-09 LAB — POCT GLYCOSYLATED HEMOGLOBIN (HGB A1C): Hemoglobin A1C: 9.5 % — AB (ref 4.0–5.6)

## 2021-09-09 MED ORDER — HUMULIN R U-500 KWIKPEN 500 UNIT/ML ~~LOC~~ SOPN: 75.0000 [IU] | PEN_INJECTOR | Freq: Three times a day (TID) | SUBCUTANEOUS | 2 refills | Status: DC

## 2021-09-09 NOTE — Progress Notes (Signed)
09/09/2021   Endocrinology follow-up note   Subjective:    Patient ID: Paul Baker, male    DOB: 08-02-1950, PCP Kathyrn Drown, MD   Past Medical History:  Diagnosis Date   Arthritis    Asthma    Atrial fibrillation [facility])    Diagnosed December 2021   Colon polyp    Coronary atherosclerosis of native coronary artery    a. 2011: cath showing 90% stenosis along small non-dominant RCA (too small for PCI). b. 01/2018: cath showing nonobstructive CAD with 60 to 70% proximal to mid nondominant RCA stenosis, 50% mid LAD and 40 to 50% OM1   DVT (deep venous thrombosis) ([place]) 2005   Right arm   Essential hypertension    Headache    History of transfusion    Hypothyroidism    Mixed hyperlipidemia    Morbid obesity ([place])    MRSA (methicillin resistant staph aureus) culture positive    08/2012   Narcolepsy    OSA (obstructive sleep apnea)    CPAP   Osteoarthritis    Pneumonia    Psoriasis    Pulmonary embolism ([place]) 2004   RLS (restless legs syndrome)    Rotator cuff disorder    Left   Septic arthritis of knee, left (HCC)    Skin cancer, basal cell    Type 2 diabetes mellitus ([place])    Past Surgical History:  Procedure Laterality Date   BACK SURGERY     CATARACT EXTRACTION Bilateral    COLONOSCOPY     CYST REMOVAL TRUNK     from back   EYE SURGERY Left 2016   laser to left eye   HIP SURGERY     bone removed from both sides of hip   KNEE ARTHROTOMY Right 12/04/2014   Procedure: KNEE ARTHROTOMY PATELLA LIGAMENT RECONSTRUSION AND REPAIR RIGHT KNEE;  Surgeon: Paralee Cancel, MD;  Location: [facility];  Service: Orthopedics;  Laterality: Right;   KNEE SURGERY     X 25 TIMES   LEFT HEART CATH AND CORONARY ANGIOGRAPHY N/A 01/17/2018   Procedure: LEFT HEART CATH AND CORONARY ANGIOGRAPHY;  Surgeon: Belva Crome, MD;  Location: [facility];  Service: Cardiovascular;  Laterality: N/A;   LUMBAR DISC SURGERY     Left L3, L4, L5 discecotomy with decompression of L4 root    TONSILLECTOMY     TOTAL KNEE ARTHROPLASTY  2003   LEFT   TOTAL KNEE ARTHROPLASTY Right 03/23/2014   Procedure: RIGHT TOTAL KNEE ARTHROPLASTY AND REMOVAL RIGHT TIBIAL  DEEP IMPLANT STAPLE;  Surgeon: Paralee Cancel, MD;  Location: [facility];  Service: Orthopedics;  Laterality: Right;   TOTAL KNEE REVISION  2005   LEFT   WRIST SURGERY     Social History   Socioeconomic History   Marital status: Married    Spouse name: Oleta   Number of children: 2   Years of education: college   Highest education level: Not on file  Occupational History   Occupation: Disabled    Employer: UNEMPLOYED  Tobacco Use   Smoking status: Former    Packs/day: 1.50    Years: 10.00    Total pack years: 15.00    Types: Cigarettes    Start date: 06/19/1967    Quit date: 01/03/1995    Years since quitting: 26.7   Smokeless tobacco: Never  Vaping Use   Vaping Use: Never used  Substance and Sexual Activity   Alcohol use: No    Alcohol/week: 0.0 standard drinks  of alcohol    Comment: quit drinking in 07/86   Drug use: No   Sexual activity: Yes    Partners: Female  Other Topics Concern   Not on file  Social History Narrative    71 year old, right-handed, caucasian male with a past medical history of obesity, hypertension, hyperlipidemia, diabetes, obstructive sleep apnea, presenting with frequent nighttime awakenings, excessive daytime sleepiness, also transient confusional episodes.RLS and one beosity, OSA on CPAP with AHI of 3.2 and  setting of 16 cm water , [facility] .   Married since 1977.   Social Determinants of Health   Financial Resource Strain: Low Risk  (01/18/2021)   Overall Financial Resource Strain (CARDIA)    Difficulty of Paying Living Expenses: Not hard at all  Food Insecurity: No Food Insecurity (01/18/2021)   Hunger Vital Sign    Worried About Running Out of Food in the Last Year: Never true    Ran Out of Food in the Last Year: Never true  Transportation Needs: No Transportation Needs  (01/18/2021)   PRAPARE - Hydrologist (Medical): No    Lack of Transportation (Non-Medical): No  Physical Activity: Sufficiently Active (01/18/2021)   Exercise Vital Sign    Days of Exercise per Week: 3 days    Minutes of Exercise per Session: 60 min  Stress: No Stress Concern Present (01/18/2021)   [place]    Feeling of Stress : Only a little  Social Connections: Socially Integrated (01/18/2021)   Social Connection and Isolation Panel [NHANES]    Frequency of Communication with Friends and Family: More than three times a week    Frequency of Social Gatherings with Friends and Family: More than three times a week    Attends Religious Services: More than 4 times per year    Active Member of [employer] or Organizations: Yes    Attends Archivist Meetings: More than 4 times per year    Marital Status: Married   Outpatient Encounter Medications as of 09/09/2021  Medication Sig   acetaminophen (TYLENOL) 325 MG tablet Take 2 tablets (650 mg total) by mouth every 6 (six) hours as needed for mild pain (or Fever >/= 101).   albuterol (PROVENTIL) (2.5 MG/3ML) 0.083% nebulizer solution Take 3 mLs (2.5 mg total) by nebulization every 6 (six) hours as needed for up to 7 days for wheezing or shortness of breath.   albuterol (VENTOLIN HFA) 108 (90 Base) MCG/ACT inhaler Inhale 2 puffs into the lungs every 6 (six) hours as needed for wheezing or shortness of breath.   alfuzosin (UROXATRAL) 10 MG 24 hr tablet Take 10 mg by mouth daily.   apixaban (ELIQUIS) 5 MG TABS tablet Take 1 tablet (5 mg total) by mouth 2 (two) times daily. For stroke prevention   ezetimibe (ZETIA) 10 MG tablet Take 1 tablet (10 mg total) by mouth daily.   Fluticasone-Umeclidin-Vilant (TRELEGY ELLIPTA) 100-62.5-25 MCG/ACT AEPB INHALE 1 PUFF INTO LUNGS ONCE DAILY   furosemide (LASIX) 40 MG tablet Take by mouth. Take '40mg'$  daily alternating  with '80mg'$  every other day.   Glucagon (GVOKE HYPOPEN 2-PACK) 1 MG/0.2ML SOAJ Inject 1 mg subcutaneously once as needed for low blood sugar. May repeat dose in 15 minutes as needed using a new device.   Insulin Pen Needle (B-D ULTRAFINE III SHORT PEN) 31G X 8 MM MISC USE 1 PEN NEEDLE 3 TIMES DAILY   levothyroxine (SYNTHROID) 137 MCG tablet TAKE 1  TABLET BY MOUTH ONCE DAILY BEFORE BREAKFAST   loratadine (CLARITIN) 10 MG tablet Take 10 mg by mouth daily as needed for allergies.    losartan (COZAAR) 100 MG tablet Take 100 mg by mouth daily.   metoprolol succinate (TOPROL XL) 25 MG 24 hr tablet Take 1 tablet (25 mg total) by mouth daily as needed (for prolonged palpitations).   metoprolol succinate (TOPROL-XL) 100 MG 24 hr tablet Take 1 tablet (100 mg total) by mouth 2 (two) times daily.   modafinil (PROVIGIL) 200 MG tablet Take 1 tablet (200 mg total) by mouth daily.   MYRBETRIQ 50 MG TB24 tablet Take 50 mg by mouth daily.   nitroGLYCERIN (NITROSTAT) 0.4 MG SL tablet DISSOLVE 1 TABLET UNDER TONGUE EVERY 5 MINUTES UP TO 15 MIN FOR CHESTPAIN. IF NO RELIEF CALL 911.   ondansetron (ZOFRAN-ODT) 4 MG disintegrating tablet Take 1 tablet (4 mg total) by mouth every 8 (eight) hours as needed for nausea or vomiting.   ONE TOUCH ULTRA TEST test strip TEST BLOOD SUGAR UP TO 4 TIMES DAILY.   ONETOUCH DELICA LANCETS 72Z MISC USE AS DIRECTED TO TEST BLOOD SUGAR 4 TIMES DAILY.   pravastatin (PRAVACHOL) 40 MG tablet TAKE 1 TABLET BY MOUTH ONCE DAILY IN THE EVENING   rOPINIRole (REQUIP) 4 MG tablet Take 0.5 tablets (2 mg total) by mouth 2 (two) times daily.   Semaglutide, 1 MG/DOSE, 4 MG/3ML SOPN Inject 1 mg as directed once a week.   insulin regular human CONCENTRATED (HUMULIN R U-500 KWIKPEN) 500 UNIT/ML KwikPen Inject 75-100 Units into the skin 3 (three) times daily with meals. Inject 85 units with breakfast and lunch, and 65 units with supper if glucose is above 90 and you are eating   rOPINIRole (REQUIP XL) 4 MG 24  hr tablet Take 1 tablet (4 mg total) by mouth at bedtime.   [DISCONTINUED] FARXIGA 10 MG TABS tablet Take 10 mg by mouth daily. (Patient not taking: Reported on 09/09/2021)   [DISCONTINUED] insulin regular human CONCENTRATED (HUMULIN R U-500 KWIKPEN) 500 UNIT/ML KwikPen Inject 65-85 Units into the skin 3 (three) times daily with meals. Inject 85 units with breakfast and lunch, and 65 units with supper if glucose is above 90 and you are eating (Patient taking differently: Inject 65-85 Units into the skin 3 (three) times daily with meals. Inject 85 units with breakfast and lunch, and 65 units with supper if glucose is above 90 and you are eating  Patient states that he uses 85 units with breakfast and lunch , but if BS is real high he will inject 95 units, 65 units with supper.)   No facility-administered encounter medications on file as of 09/09/2021.   ALLERGIES: Allergies  Allergen Reactions   Tape Rash    Pulls off skin   Cardizem [Diltiazem Hcl]     Edema    Cardura [Doxazosin Mesylate]     Headaches / cramps   Crestor [Rosuvastatin] Other (See Comments)    Leg cramps   Lipitor [Atorvastatin] Other (See Comments)    Leg cramps   Paxil [Paroxetine Hcl]     Unknown reaction    Codeine Rash and Other (See Comments)    Headache    Gabapentin Rash   VACCINATION STATUS: Immunization History  Administered Date(s) Administered   Influenza Split 09/18/2012   Influenza,inj,Quad PF,6+ Mos 10/13/2013, 10/06/2014, 09/11/2018   Influenza-Unspecified 12/01/2015, 11/08/2016, 11/02/2020   PFIZER(Purple Top)SARS-COV-2 Vaccination 03/06/2019, 03/27/2019   Pneumococcal Polysaccharide-23 10/02/2004, 10/03/2011   Td 05/24/2006  Diabetes He presents for his follow-up diabetic visit. He has type 2 diabetes mellitus. Onset time: Was diagnosed at approximate age of 75 years. His disease course has been fluctuating. There are no hypoglycemic associated symptoms. Pertinent negatives for hypoglycemia  include no confusion, headaches, nervousness/anxiousness, pallor, seizures or tremors. Associated symptoms include fatigue and foot paresthesias. Pertinent negatives for diabetes include no chest pain, no polydipsia, no polyphagia, no polyuria, no weakness and no weight loss. There are no hypoglycemic complications. Symptoms are stable. Diabetic complications include a CVA, heart disease, nephropathy and peripheral neuropathy. (Has had 2 mini strokes ) Risk factors for coronary artery disease include diabetes mellitus, dyslipidemia, male sex, obesity, sedentary lifestyle, tobacco exposure and hypertension. Current diabetic treatment includes intensive insulin program (and Ozempic). He is compliant with treatment most of the time. His weight is fluctuating minimally. He is following a generally healthy diet. When asked about meal planning, he reported none. He has had a previous visit with a dietitian. He never participates in exercise. His home blood glucose trend is fluctuating minimally. His breakfast blood glucose range is generally >200 mg/dl. His lunch blood glucose range is generally >200 mg/dl. His dinner blood glucose range is generally >200 mg/dl. His bedtime blood glucose range is generally >200 mg/dl. His overall blood glucose range is >200 mg/dl. (He presents today with his CGM showing slightly improved glycemic profile with gross hyperglycemia overall.  His POCT A1c today is 9.5%, improving from last visit of 10.6%.  He notes his glucose will increase drastically after eating anything (even a bowl of soup) and take forever before it comes back down with the insulin.  He paused his Ozempic for a period of time since last visit due to some diarrhea which was determined to be from a viral illness, then resumed once he felt better.  Analysis of his CGM shows TIR 34%, TAR 65%, TBR < 1% with a GMI of 9.1%. ) An ACE inhibitor/angiotensin II receptor blocker is being taken. He sees a podiatrist.Eye exam is  current.  Hyperlipidemia This is a chronic problem. The current episode started more than 1 year ago. The problem is uncontrolled. Recent lipid tests were reviewed and are variable. Exacerbating diseases include chronic renal disease, diabetes, hypothyroidism and obesity. Factors aggravating his hyperlipidemia include fatty foods. Pertinent negatives include no chest pain, myalgias or shortness of breath. Current antihyperlipidemic treatment includes statins. The current treatment provides mild improvement of lipids. Compliance problems include adherence to diet and adherence to exercise.  Risk factors for coronary artery disease include dyslipidemia, diabetes mellitus, hypertension, male sex, obesity and a sedentary lifestyle.  Hypertension This is a chronic problem. The current episode started more than 1 year ago. The problem has been resolved since onset. The problem is controlled. Pertinent negatives include no chest pain, headaches, neck pain, palpitations or shortness of breath. Agents associated with hypertension include thyroid hormones. Risk factors for coronary artery disease include diabetes mellitus, dyslipidemia, male gender, obesity, sedentary lifestyle and smoking/tobacco exposure. Past treatments include angiotensin blockers, beta blockers and diuretics. Compliance problems include exercise and diet.  Hypertensive end-organ damage includes kidney disease, CAD/MI and CVA. Identifiable causes of hypertension include chronic renal disease and a thyroid problem.  Thyroid Problem Presents for follow-up visit. Symptoms include fatigue and leg swelling. Patient reports no anxiety, cold intolerance, constipation, depressed mood, diarrhea, heat intolerance, palpitations, tremors, weight gain or weight loss. The symptoms have been stable. Past treatments include levothyroxine. His past medical history is significant for diabetes and hyperlipidemia.  Review of systems  Constitutional: + stable,   current Body mass index is 38.21 kg/m. , no fatigue, no subjective hyperthermia, no subjective hypothermia, reports mild memory impairment since mini strokes Eyes: no blurry vision, no xerophthalmia ENT: no sore throat, no nodules palpated in throat, no dysphagia/odynophagia, no hoarseness Cardiovascular: no chest pain, no shortness of breath, no palpitations,  Respiratory: no cough, no shortness of breath Gastrointestinal: no nausea/vomiting/diarrhea Musculoskeletal: no muscle/joint aches Skin: no rashes, no hyperemia Neurological: no tremors, no numbness, no tingling, no dizziness, + intermittent numbness/tingling to bilateral feet Psychiatric: no depression, no anxiety    Objective:    BP 137/79 (BP Location: Right Arm, Patient Position: Sitting, Cuff Size: Large)   Pulse 71   Ht '5\' 4"'$  (1.626 m)   Wt 222 lb 9.6 oz (101 kg)   BMI 38.21 kg/m   Wt Readings from Last 3 Encounters:  09/09/21 222 lb 9.6 oz (101 kg)  09/01/21 222 lb 9.6 oz (101 kg)  06/28/21 216 lb 12.8 oz (98.3 kg)     BP Readings from Last 3 Encounters:  09/09/21 137/79  09/01/21 118/70  06/28/21 140/84     Physical Exam- Limited  Constitutional:  Body mass index is 38.21 kg/m. , not in acute distress, normal state of mind Eyes:  EOMI, no exophthalmos Neck: Supple Cardiovascular: Irregular (hx afib), no murmurs, rubs, or gallops, no edema Respiratory: Adequate breathing efforts, no crackles, rales, rhonchi, or wheezing Musculoskeletal: no gross deformities, strength intact in all four extremities, no gross restriction of joint movements Skin:  no rashes, no hyperemia Neurological: no tremor with outstretched hands   Diabetic Foot Exam - Simple   No data filed        Latest Ref Rng & Units 09/01/2021    3:06 PM 06/03/2021    7:36 PM 03/11/2021    3:20 PM  CMP  Glucose 70 - 99 mg/dL 214  380  269   BUN 8 - 27 mg/dL 24  43  33   Creatinine 0.76 - 1.27 mg/dL 1.10  1.68  1.29   Sodium 134 - 144  mmol/L 141  129  137   Potassium 3.5 - 5.2 mmol/L 4.4  4.1  3.8   Chloride 96 - 106 mmol/L 100  94  101   CO2 20 - 29 mmol/L '24  23  25   '$ Calcium 8.6 - 10.2 mg/dL 9.9  9.4  8.9   Total Protein 6.0 - 8.5 g/dL 7.0     Total Bilirubin 0.0 - 1.2 mg/dL <0.2     Alkaline Phos 44 - 121 IU/L 74     AST 0 - 40 IU/L 17     ALT 0 - 44 IU/L 13        Diabetic Labs (most recent): Lab Results  Component Value Date   HGBA1C 9.5 (A) 09/09/2021   HGBA1C 10.6 08/23/2021   HGBA1C 9.6 06/09/2021   MICROALBUR 10 06/09/2021   MICROALBUR 9.5 02/06/2019   MICROALBUR 4.6 10/08/2017   Lipid Panel     Component Value Date/Time   CHOL 166 04/05/2021 0819   TRIG 226 (H) 04/05/2021 0819   HDL 55 04/05/2021 0819   CHOLHDL 3.0 04/05/2021 0819   CHOLHDL 3.2 08/05/2019 0334   VLDL 19 08/05/2019 0334   LDLCALC 74 04/05/2021 0819   LDLCALC 118 (H) 02/06/2019 0813     Assessment & Plan:   1) Uncontrolled type 2 diabetes mellitus with circulatory complication, with long-term current use of  insulin (HCC)  -His diabetes is complicated by coronary artery disease and patient remains at an extremely high risk for more acute and chronic complications of diabetes which include CAD, CVA, CKD, retinopathy, and neuropathy. These are all discussed in detail with the patient.  He presents today with his CGM showing slightly improved glycemic profile with gross hyperglycemia overall.  His POCT A1c today is 9.5%, improving from last visit of 10.6%.  He notes his glucose will increase drastically after eating anything (even a bowl of soup) and take forever before it comes back down with the insulin.  He paused his Ozempic for a period of time since last visit due to some diarrhea which was determined to be from a viral illness, then resumed once he felt better.  Analysis of his CGM shows TIR 34%, TAR 65%, TBR < 1% with a GMI of 9.1%.     Glucose logs and insulin administration records pertaining to this visit,  to be  scanned into patient's records.  Recent labs reviewed.  - Nutritional counseling repeated at each appointment due to patients tendency to fall back in to old habits.  - The patient admits there is a room for improvement in their diet and drink choices. -  Suggestion is made for the patient to avoid simple carbohydrates from their diet including Cakes, Sweet Desserts / Pastries, Ice Cream, Soda (diet and regular), Sweet Tea, Candies, Chips, Cookies, Sweet Pastries, Store Bought Juices, Alcohol in Excess of 1-2 drinks a day, Artificial Sweeteners, Coffee Creamer, and "Sugar-free" Products. This will help patient to have stable blood glucose profile and potentially avoid unintended weight gain.   - I encouraged the patient to switch to unprocessed or minimally processed complex starch and increased protein intake (animal or plant source), fruits, and vegetables.   - Patient is advised to stick to a routine mealtimes to eat 3 meals a day and avoid unnecessary snacks (to snack only to correct hypoglycemia).  - I have approached patient with the following individualized plan to manage diabetes and patient agrees.  -He will continue to need intensive treatment with higher dose of insulin.  He has been better with U500 versus [place] and regular insulin.     -He will tolerate increase in  Humulin R U500 to 100 units with breakfast and lunch and 75 units with supper if glucose is above 90 and he is eating.  He can continue his Ozempic 1 mg SQ weekly.   -We did discuss potentially adding the Omnipod DASH to help manage his glucose given his variations.    -He is encouraged to continue using his CGM to monitor glucose 4 times daily, before meals and before bed, and to call the clinic if he has readings less than 70 or greater than 300 for 3 tests in a row.  2) BP/HTN:  His blood pressure is controlled to target.  He is advised to continue Lasix 40 mg po daily, Losartan 50 mg po daily, Norvasc 5 mg po  daily, and Metoprolol 75 mg po twice daily.  3) Lipids/HPL:  His most recent lipid panel from 04/05/21 shows controlled LDL of 74 (improving) and elevated triglycerides of 226.  He is advised to continue Pravastatin 40 mg po daily at bedtime.  Side effects and precautions discussed with him.    4)  Weight/Diet:  His Body mass index is 57.32 kg/m.-complicating his diabetes care.  He is a candidate for modest weight loss.  CDE consult in progress, exercise, and carbohydrates  information provided.  5) Hypothyroidism -There are no recent TFTs to review.  He is advised to continue current dose of Levothyroxine at 137 mcg po daily before breakfast.  Will recheck TFTs prior to next visit.   - We discussed about the correct intake of his thyroid hormone, on empty stomach at fasting, with water, separated by at least 30 minutes from breakfast and other medications,  and separated by more than 4 hours from calcium, iron, multivitamins, acid reflux medications (PPIs). -Patient is made aware of the fact that thyroid hormone replacement is needed for life, dose to be adjusted by periodic monitoring of thyroid function tests.  6) Chronic Care/Health Maintenance: -Patient is on ACEI/ARB and Statin medications and encouraged to continue to follow up with Ophthalmology, Podiatrist at least yearly or according to recommendations, and advised to  stay away from smoking. I have recommended yearly flu vaccine and pneumonia vaccination at least every 5 years; moderate intensity exercise for up to 150 minutes weekly; and  sleep for at least 7 hours a day.  - I advised patient to maintain close follow up with Kathyrn Drown, MD for primary care needs.       I spent 46 minutes in the care of the patient today including review of labs from [facility], Lipids, Thyroid Function, Hematology (current and previous including abstractions from other facilities); face-to-face time discussing  his blood glucose readings/logs,  discussing hypoglycemia and hyperglycemia episodes and symptoms, medications doses, his options of short and long term treatment based on the latest standards of care / guidelines;  discussion about incorporating lifestyle medicine;  and documenting the encounter. Risk reduction counseling performed per USPSTF guidelines to reduce obesity and cardiovascular risk factors.     Please refer to Patient Instructions for Blood Glucose Monitoring and Insulin/Medications Dosing Guide"  in media tab for additional information. Please  also refer to " Patient Self Inventory" in the Media  tab for reviewed elements of pertinent patient history.  Paul Baker participated in the discussions, expressed understanding, and voiced agreement with the above plans.  All questions were answered to his satisfaction. he is encouraged to contact clinic should he have any questions or concerns prior to his return visit.   Follow up plan: -Return in about 3 months (around 12/09/2021) for Diabetes F/U with A1c in office, Thyroid follow up, Previsit labs, Bring meter and logs.  Rayetta Pigg, [facility] [address] [place], [place] [address] Phone: [phone] Fax: [phone]  09/09/2021, 11:16 AM

## 2021-09-10 DIAGNOSIS — M179 Osteoarthritis of knee, unspecified: Secondary | ICD-10-CM | POA: Diagnosis not present

## 2021-09-10 DIAGNOSIS — G4733 Obstructive sleep apnea (adult) (pediatric): Secondary | ICD-10-CM | POA: Diagnosis not present

## 2021-09-12 DIAGNOSIS — Z794 Long term (current) use of insulin: Secondary | ICD-10-CM | POA: Diagnosis not present

## 2021-09-12 DIAGNOSIS — I110 Hypertensive heart disease with heart failure: Secondary | ICD-10-CM | POA: Diagnosis not present

## 2021-09-12 DIAGNOSIS — E1122 Type 2 diabetes mellitus with diabetic chronic kidney disease: Secondary | ICD-10-CM | POA: Diagnosis not present

## 2021-09-12 DIAGNOSIS — I252 Old myocardial infarction: Secondary | ICD-10-CM | POA: Diagnosis not present

## 2021-09-12 DIAGNOSIS — D6869 Other thrombophilia: Secondary | ICD-10-CM | POA: Diagnosis not present

## 2021-09-12 DIAGNOSIS — I4891 Unspecified atrial fibrillation: Secondary | ICD-10-CM | POA: Diagnosis not present

## 2021-09-12 DIAGNOSIS — I509 Heart failure, unspecified: Secondary | ICD-10-CM | POA: Diagnosis not present

## 2021-09-12 DIAGNOSIS — E785 Hyperlipidemia, unspecified: Secondary | ICD-10-CM | POA: Diagnosis not present

## 2021-09-12 DIAGNOSIS — E039 Hypothyroidism, unspecified: Secondary | ICD-10-CM | POA: Diagnosis not present

## 2021-09-12 DIAGNOSIS — I2510 Atherosclerotic heart disease of native coronary artery without angina pectoris: Secondary | ICD-10-CM | POA: Diagnosis not present

## 2021-09-12 DIAGNOSIS — J449 Chronic obstructive pulmonary disease, unspecified: Secondary | ICD-10-CM | POA: Diagnosis not present

## 2021-09-16 DIAGNOSIS — E1110 Type 2 diabetes mellitus with ketoacidosis without coma: Secondary | ICD-10-CM | POA: Diagnosis not present

## 2021-09-19 ENCOUNTER — Ambulatory Visit (INDEPENDENT_AMBULATORY_CARE_PROVIDER_SITE_OTHER): Payer: Medicare HMO | Admitting: Nurse Practitioner

## 2021-09-19 ENCOUNTER — Ambulatory Visit (HOSPITAL_COMMUNITY)
Admission: RE | Admit: 2021-09-19 | Discharge: 2021-09-19 | Disposition: A | Discharge status: Home / Self Care | Payer: Medicare HMO | Source: Ambulatory Visit | Attending: Nurse Practitioner | Admitting: Nurse Practitioner

## 2021-09-19 VITALS — BP 127/69 | Temp 98.4°F | Ht 64.0 in | Wt 222.0 lb

## 2021-09-19 DIAGNOSIS — R197 Diarrhea, unspecified: Secondary | ICD-10-CM | POA: Diagnosis not present

## 2021-09-19 DIAGNOSIS — R0602 Shortness of breath: Secondary | ICD-10-CM | POA: Diagnosis not present

## 2021-09-19 DIAGNOSIS — R109 Unspecified abdominal pain: Secondary | ICD-10-CM | POA: Diagnosis not present

## 2021-09-19 DIAGNOSIS — M419 Scoliosis, unspecified: Secondary | ICD-10-CM | POA: Diagnosis not present

## 2021-09-19 NOTE — Progress Notes (Signed)
   Subjective:    Patient ID: Paul Baker, male    DOB: 1950/03/23, 71 y.o.   MRN: [id number]  HPI Patient arrives with stomach issues for a few days. Patient states he is having upset stomach and diarrhea- no nausea or vomiting.   Review of Systems     Objective:   Physical Exam        Assessment & Plan:

## 2021-09-20 LAB — CMP14+EGFR
ALT: 13 IU/L (ref 0–44)
AST: 13 IU/L (ref 0–40)
Albumin/Globulin Ratio: 1.8 (ref 1.2–2.2)
Albumin: 4.5 g/dL (ref 3.9–4.9)
Alkaline Phosphatase: 75 IU/L (ref 44–121)
BUN/Creatinine Ratio: 18 (ref 10–24)
BUN: 23 mg/dL (ref 8–27)
Bilirubin Total: <0.2 mg/dL (ref 0.0–1.2)
CO2: 22 mmol/L (ref 20–29)
Calcium: 10.3 mg/dL — ABNORMAL HIGH (ref 8.6–10.2)
Chloride: 98 mmol/L (ref 96–106)
Creatinine, Ser: 1.25 mg/dL (ref 0.76–1.27)
Globulin, Total: 2.5 g/dL (ref 1.5–4.5)
Glucose: 324 mg/dL — ABNORMAL HIGH (ref 70–99)
Potassium: 4.1 mmol/L (ref 3.5–5.2)
Sodium: 138 mmol/L (ref 134–144)
Total Protein: 7 g/dL (ref 6.0–8.5)
eGFR: 62 mL/min/{1.73_m2} (ref >59)

## 2021-09-20 LAB — SEDIMENTATION RATE: Sed Rate: 47 mm/hr — ABNORMAL HIGH (ref 0–30)

## 2021-09-20 LAB — LIPASE: Lipase: 55 U/L (ref 13–78)

## 2021-09-20 LAB — AMYLASE: Amylase: 61 U/L (ref 31–110)

## 2021-09-20 LAB — C-REACTIVE PROTEIN: CRP: 4 mg/L (ref 0–10)

## 2021-09-21 ENCOUNTER — Encounter: Payer: Self-pay | Admitting: Nurse Practitioner

## 2021-09-22 DIAGNOSIS — R197 Diarrhea, unspecified: Secondary | ICD-10-CM | POA: Diagnosis not present

## 2021-09-25 ENCOUNTER — Other Ambulatory Visit: Payer: Self-pay | Admitting: Nurse Practitioner

## 2021-09-26 LAB — C DIFFICILE, CYTOTOXIN B

## 2021-09-26 LAB — STOOL CULTURE: E coli, Shiga toxin Assay: NEGATIVE

## 2021-09-26 LAB — C DIFFICILE TOXINS A+B W/RFLX: C difficile Toxins A+B, EIA: NEGATIVE

## 2021-09-28 NOTE — Progress Notes (Signed)
Left message to return call 

## 2021-10-03 ENCOUNTER — Telehealth: Payer: Self-pay | Admitting: Neurology

## 2021-10-03 DIAGNOSIS — Z86010 Personal history of colonic polyps: Secondary | ICD-10-CM | POA: Diagnosis not present

## 2021-10-03 DIAGNOSIS — K219 Gastro-esophageal reflux disease without esophagitis: Secondary | ICD-10-CM | POA: Diagnosis not present

## 2021-10-03 DIAGNOSIS — R197 Diarrhea, unspecified: Secondary | ICD-10-CM | POA: Diagnosis not present

## 2021-10-03 DIAGNOSIS — R1084 Generalized abdominal pain: Secondary | ICD-10-CM | POA: Diagnosis not present

## 2021-10-03 NOTE — Telephone Encounter (Signed)
LVM and sent mychart msg informing pt of need to reschedule 11/22 appointment - MD out

## 2021-10-04 ENCOUNTER — Telehealth: Payer: Self-pay | Admitting: Cardiology

## 2021-10-04 NOTE — Telephone Encounter (Signed)
   Pre-operative Risk Assessment    Patient Name: Paul Baker  DOB: 11/26/1950 MRN: [id number]      Request for Surgical Clearance    Procedure:   Colonoscopy  Date of Surgery:  Clearance TBD                                 Surgeon:  Not listed Surgeon's Group or Practice Name:  [facility] Phone number:  [phone] Fax number:  [phone]   Type of Clearance Requested:   - Medical  - Pharmacy:  Hold Aspirin and Apixaban (Eliquis)     Type of Anesthesia:  Not Indicated   Additional requests/questions:  Please advise surgeon/provider what medications should be held. Reason for procedure: abdominal pain, diarrhea, history of polyps.  Signed, Hipolito Bayley   10/04/2021, 7:48 AM

## 2021-10-04 NOTE — Progress Notes (Unsigned)
Cardiology Office Note  Date: 10/05/2021   ID: Paul Baker, DOB Feb 13, 1950, MRN [id number]  PCP:  Paul Drown, MD  Cardiologist:  Paul Lesches, MD Electrophysiologist:  None   Chief Complaint  Patient presents with   Cardiac follow-up    History of Present Illness: Paul Baker is a 71 y.o. male last seen in June.  He is here for a routine visit.  Reports no increasing angina on medical therapy, also good control of paroxysmal atrial fibrillation.  NYHA class II dyspnea.  He does have breakthrough palpitations, but these have not been overly prolonged and controlled with current dose of Toprol-XL.  He is being considered for colonoscopy with request to temporarily interrupt Eliquis.  Pharmacy protocol recommends 24-hour hold.  He is not on aspirin at this time.  I reviewed his medications.  He reports no spontaneous bleeding problems on Eliquis.  Lab work was obtained in September and reviewed below.  Past Medical History:  Diagnosis Date   Arthritis    Asthma    Atrial fibrillation [facility])    Diagnosed December 2021   Colon polyp    Coronary atherosclerosis of native coronary artery    a. 2011: cath showing 90% stenosis along small non-dominant RCA (too small for PCI). b. 01/2018: cath showing nonobstructive CAD with 60 to 70% proximal to mid nondominant RCA stenosis, 50% mid LAD and 40 to 50% OM1   DVT (deep venous thrombosis) ([place]) 2005   Right arm   Essential hypertension    Headache    History of transfusion    Hypothyroidism    Mixed hyperlipidemia    Morbid obesity ([place])    MRSA (methicillin resistant staph aureus) culture positive    08/2012   Narcolepsy    OSA (obstructive sleep apnea)    CPAP   Osteoarthritis    Pneumonia    Psoriasis    Pulmonary embolism ([place]) 2004   RLS (restless legs syndrome)    Rotator cuff disorder    Left   Septic arthritis of knee, left (HCC)    Skin cancer, basal cell    Type 2 diabetes mellitus ([place])      Past Surgical History:  Procedure Laterality Date   BACK SURGERY     CATARACT EXTRACTION Bilateral    COLONOSCOPY     CYST REMOVAL TRUNK     from back   EYE SURGERY Left 2016   laser to left eye   HIP SURGERY     bone removed from both sides of hip   KNEE ARTHROTOMY Right 12/04/2014   Procedure: KNEE ARTHROTOMY PATELLA LIGAMENT RECONSTRUSION AND REPAIR RIGHT KNEE;  Surgeon: Paul Cancel, MD;  Location: [facility];  Service: Orthopedics;  Laterality: Right;   KNEE SURGERY     X 25 TIMES   LEFT HEART CATH AND CORONARY ANGIOGRAPHY N/A 01/17/2018   Procedure: LEFT HEART CATH AND CORONARY ANGIOGRAPHY;  Surgeon: Paul Crome, MD;  Location: [facility];  Service: Cardiovascular;  Laterality: N/A;   LUMBAR DISC SURGERY     Left L3, L4, L5 discecotomy with decompression of L4 root   TONSILLECTOMY     TOTAL KNEE ARTHROPLASTY  2003   LEFT   TOTAL KNEE ARTHROPLASTY Right 03/23/2014   Procedure: RIGHT TOTAL KNEE ARTHROPLASTY AND REMOVAL RIGHT TIBIAL  DEEP IMPLANT STAPLE;  Surgeon: Paul Cancel, MD;  Location: [facility];  Service: Orthopedics;  Laterality: Right;   TOTAL KNEE REVISION  2005   LEFT  WRIST SURGERY      Current Outpatient Medications  Medication Sig Dispense Refill   acetaminophen (TYLENOL) 325 MG tablet Take 2 tablets (650 mg total) by mouth every 6 (six) hours as needed for mild pain (or Fever >/= 101). 30 tablet 1   albuterol (PROVENTIL) (2.5 MG/3ML) 0.083% nebulizer solution Take 3 mLs (2.5 mg total) by nebulization every 6 (six) hours as needed for up to 7 days for wheezing or shortness of breath. 84 mL 0   albuterol (VENTOLIN HFA) 108 (90 Base) MCG/ACT inhaler Inhale 2 puffs into the lungs every 6 (six) hours as needed for wheezing or shortness of breath. 18 g 5   alfuzosin (UROXATRAL) 10 MG 24 hr tablet Take 10 mg by mouth daily.     apixaban (ELIQUIS) 5 MG TABS tablet Take 1 tablet (5 mg total) by mouth 2 (two) times daily. For stroke prevention 180 tablet 3    ezetimibe (ZETIA) 10 MG tablet Take 1 tablet (10 mg total) by mouth daily. 90 tablet 3   Fluticasone-Umeclidin-Vilant (TRELEGY ELLIPTA) 100-62.5-25 MCG/ACT AEPB INHALE 1 PUFF INTO LUNGS ONCE DAILY 60 each 6   furosemide (LASIX) 40 MG tablet Take by mouth. Take '40mg'$  daily alternating with '80mg'$  every other day.     Glucagon (GVOKE HYPOPEN 2-PACK) 1 MG/0.2ML SOAJ Inject 1 mg subcutaneously once as needed for low blood sugar. May repeat dose in 15 minutes as needed using a new device. 0.4 mL 1   Insulin Pen Needle (B-D ULTRAFINE III SHORT PEN) 31G X 8 MM MISC USE 1 PEN NEEDLE 3 TIMES DAILY 100 each 0   insulin regular human CONCENTRATED (HUMULIN R U-500 KWIKPEN) 500 UNIT/ML KwikPen Inject 75-100 Units into the skin 3 (three) times daily with meals. Inject 85 units with breakfast and lunch, and 65 units with supper if glucose is above 90 and you are eating 60 mL 2   levothyroxine (SYNTHROID) 137 MCG tablet TAKE 1 TABLET BY MOUTH ONCE DAILY BEFORE BREAKFAST 90 tablet 0   loratadine (CLARITIN) 10 MG tablet Take 10 mg by mouth daily as needed for allergies.      losartan (COZAAR) 100 MG tablet Take 100 mg by mouth daily.     metoprolol succinate (TOPROL XL) 25 MG 24 hr tablet Take 1 tablet (25 mg total) by mouth daily as needed (for prolonged palpitations). 90 tablet 0   metoprolol succinate (TOPROL-XL) 100 MG 24 hr tablet Take 1 tablet (100 mg total) by mouth 2 (two) times daily. 180 tablet 1   modafinil (PROVIGIL) 200 MG tablet Take 1 tablet (200 mg total) by mouth daily. 90 tablet 1   MYRBETRIQ 50 MG TB24 tablet Take 50 mg by mouth daily.     nitroGLYCERIN (NITROSTAT) 0.4 MG SL tablet DISSOLVE 1 TABLET UNDER TONGUE EVERY 5 MINUTES UP TO 15 MIN FOR CHESTPAIN. IF NO RELIEF CALL 911. 25 tablet 3   ondansetron (ZOFRAN-ODT) 4 MG disintegrating tablet Take 1 tablet (4 mg total) by mouth every 8 (eight) hours as needed for nausea or vomiting. 20 tablet 0   ONE TOUCH ULTRA TEST test strip TEST BLOOD SUGAR UP TO 4  TIMES DAILY. 150 each 5   ONETOUCH DELICA LANCETS 22L MISC USE AS DIRECTED TO TEST BLOOD SUGAR 4 TIMES DAILY. 150 each 5   pantoprazole (PROTONIX) 40 MG tablet Take 40 mg by mouth daily.     pravastatin (PRAVACHOL) 40 MG tablet TAKE 1 TABLET BY MOUTH ONCE DAILY IN THE EVENING 90 tablet 1  rOPINIRole (REQUIP XL) 4 MG 24 hr tablet Take 1 tablet (4 mg total) by mouth at bedtime. 90 tablet 3   rOPINIRole (REQUIP) 4 MG tablet Take 0.5 tablets (2 mg total) by mouth 2 (two) times daily. 90 tablet 3   Semaglutide, 1 MG/DOSE, 4 MG/3ML SOPN Inject 1 mg as directed once a week. 6 mL 3   No current facility-administered medications for this visit.   Allergies:  Tape, Cardizem [diltiazem hcl], Cardura [doxazosin mesylate], Crestor [rosuvastatin], Lipitor [atorvastatin], Paxil [paroxetine hcl], Codeine, and Gabapentin   ROS:  No syncope.  Physical Exam: VS:  BP 124/72   Pulse 78   Ht '5\' 5"'$  (1.651 m)   Wt 222 lb (100.7 kg)   SpO2 95%   BMI 36.94 kg/m , BMI Body mass index is 36.94 kg/m.  Wt Readings from Last 3 Encounters:  10/05/21 222 lb (100.7 kg)  09/19/21 222 lb (100.7 kg)  09/09/21 222 lb 9.6 oz (101 kg)    General: Patient appears comfortable at rest. HEENT: Conjunctiva and lids normal. Neck: Supple, no elevated JVP or carotid bruits. Lungs: Clear to auscultation, nonlabored breathing at rest. Cardiac: Regular rate and rhythm, no S3 or significant systolic murmur. Extremities: No pitting edema.  ECG:  An ECG dated 03/11/2021 was personally reviewed today and demonstrated:  Sinus rhythm.  Recent Labwork: 02/23/2021: TSH 0.812 09/01/2021: Hemoglobin 12.1; Platelets 224 09/19/2021: ALT 13; AST 13; BUN 23; Creatinine, Ser 1.25; Potassium 4.1; Sodium 138     Component Value Date/Time   CHOL 166 04/05/2021 0819   TRIG 226 (H) 04/05/2021 0819   HDL 55 04/05/2021 0819   CHOLHDL 3.0 04/05/2021 0819   CHOLHDL 3.2 08/05/2019 0334   VLDL 19 08/05/2019 0334   LDLCALC 74 04/05/2021 0819    LDLCALC 118 (H) 02/06/2019 0813    Other Studies Reviewed Today:  Echocardiogram 08/05/2019:  1. Left ventricular ejection fraction, by estimation, is 55 to 60%. The  left ventricle has normal function. The left ventricle has no regional  wall motion abnormalities. Left ventricular diastolic parameters were  normal.   2. Right ventricular systolic function is normal. The right ventricular  size is normal. Tricuspid regurgitation signal is inadequate for assessing  PA pressure.   3. Left atrial size was upper normal.   4. The mitral valve is grossly normal. Mild mitral valve regurgitation.   5. The aortic valve is tricuspid. Aortic valve regurgitation is not  visualized.   6. The inferior vena cava is normal in size with greater than 50%  respiratory variability, suggesting right atrial pressure of 3 mmHg.   Assessment and Plan:  1.  Paroxysmal atrial fibrillation with CHA2DS2-VASc score of 5.  Symptoms are better controlled following up titration of Toprol-XL.  He remains on Eliquis for stroke prophylaxis, interval lab work reviewed.  Reports no spontaneous bleeding problems.  He is to undergo screening colonoscopy, suggest Eliquis hold for at least 24 hours prior (3 doses).  He is no longer on aspirin.  2.  CAD with moderate proximal to mid nondominant RCA disease that is being managed medically.  No accelerating angina.  He is no longer on aspirin.  Continue Toprol-XL, Cozaar, Zetia, and as needed nitroglycerin.  3.  Carotid artery disease, last assessed in 2021 with less than 50% stenosis.  Follow-up carotid Dopplers will be obtained.  Medication Adjustments/Labs and Tests Ordered: Current medicines are reviewed at length with the patient today.  Concerns regarding medicines are outlined above.   Tests Ordered:  Orders Placed This Encounter  Procedures   VAS US CAROTID    Medication Changes: No orders of the defined types were placed in this encounter.   Disposition:  Follow  up  6 months.  Signed, Satira Sark, MD, [facility] 10/05/2021 11:01 AM    [facility] [place], [place], [place] [address] Phone: [phone]; Fax: [phone]

## 2021-10-04 NOTE — Telephone Encounter (Signed)
Primary Cardiologist:Samuel Domenic Polite, MD  Chart reviewed as part of pre-operative protocol coverage. Because of Paul Baker past medical history and time since last visit, he/she will require a follow-up visit in order to better assess preoperative cardiovascular risk.  Pre-op covering staff: - Patient has an appointment with Dr. Domenic Polite on October 4 at which time clearance will be addressed.  - Please contact requesting surgeon's office via preferred method (i.e, phone, fax) to inform them of need for appointment prior to surgery.  This message will also be routed to pharmacy pool for input on holding anticoagulant agent as requested below so that this information is available at time of patient's appointment.   Emmaline Life, NP-C  10/04/2021, 9:16 AM [address], [address] Office [phone] Fax [phone]

## 2021-10-04 NOTE — Telephone Encounter (Signed)
Patient with diagnosis of A Fib on Eliquis for anticoagulation.    Procedure: colonoscopy Date of procedure: TBD   CHA2DS2-VASc Score = 4  This indicates a 4.8% annual risk of stroke. The patient's score is based upon: CHF History: 0 HTN History: 1 Diabetes History: 1 Stroke History: 0 Vascular Disease History: 1 Age Score: 1 Gender Score: 0   CrCl 59 mL/min using adj body weight Platelet count 224K   Per office protocol, patient can hold Eliquis for 1 day prior to procedure.     **This guidance is not considered finalized until pre-operative APP has relayed final recommendations.**

## 2021-10-05 ENCOUNTER — Encounter: Payer: Self-pay | Admitting: Cardiology

## 2021-10-05 ENCOUNTER — Ambulatory Visit: Payer: Medicare HMO | Attending: Cardiology | Admitting: Cardiology

## 2021-10-05 VITALS — BP 124/72 | HR 78 | Ht 65.0 in | Wt 222.0 lb

## 2021-10-05 DIAGNOSIS — I779 Disorder of arteries and arterioles, unspecified: Secondary | ICD-10-CM | POA: Diagnosis not present

## 2021-10-05 DIAGNOSIS — I480 Paroxysmal atrial fibrillation: Secondary | ICD-10-CM

## 2021-10-05 DIAGNOSIS — I25119 Atherosclerotic heart disease of native coronary artery with unspecified angina pectoris: Secondary | ICD-10-CM

## 2021-10-05 NOTE — Patient Instructions (Addendum)
Medication Instructions:  Your physician recommends that you continue on your current medications as directed. Please refer to the Current Medication list given to you today.  Labwork: none  Testing/Procedures: Your physician has requested that you have a carotid duplex. This test is an ultrasound of the carotid arteries in your neck. It looks at blood flow through these arteries that supply the brain with blood. Allow one hour for this exam. There are no restrictions or special instructions.  Follow-Up: Your physician recommends that you schedule a follow-up appointment in: 6 months  Any Other Special Instructions Will Be Listed Below (If Applicable).  If you need a refill on your cardiac medications before your next appointment, please call your pharmacy. 

## 2021-10-09 ENCOUNTER — Other Ambulatory Visit: Payer: Self-pay | Admitting: Nurse Practitioner

## 2021-10-10 ENCOUNTER — Ambulatory Visit: Payer: Medicare HMO | Attending: Cardiology

## 2021-10-10 DIAGNOSIS — I6523 Occlusion and stenosis of bilateral carotid arteries: Secondary | ICD-10-CM | POA: Diagnosis not present

## 2021-10-10 DIAGNOSIS — M179 Osteoarthritis of knee, unspecified: Secondary | ICD-10-CM | POA: Diagnosis not present

## 2021-10-10 DIAGNOSIS — I779 Disorder of arteries and arterioles, unspecified: Secondary | ICD-10-CM

## 2021-10-10 DIAGNOSIS — G4733 Obstructive sleep apnea (adult) (pediatric): Secondary | ICD-10-CM | POA: Diagnosis not present

## 2021-10-11 ENCOUNTER — Other Ambulatory Visit: Payer: Self-pay | Admitting: Nurse Practitioner

## 2021-10-11 DIAGNOSIS — E1159 Type 2 diabetes mellitus with other circulatory complications: Secondary | ICD-10-CM

## 2021-10-16 DIAGNOSIS — E1110 Type 2 diabetes mellitus with ketoacidosis without coma: Secondary | ICD-10-CM | POA: Diagnosis not present

## 2021-10-17 ENCOUNTER — Ambulatory Visit (INDEPENDENT_AMBULATORY_CARE_PROVIDER_SITE_OTHER): Payer: Medicare HMO | Admitting: Family Medicine

## 2021-10-17 ENCOUNTER — Ambulatory Visit (HOSPITAL_COMMUNITY)
Admission: RE | Admit: 2021-10-17 | Discharge: 2021-10-17 | Disposition: A | Discharge status: Home / Self Care | Payer: Medicare HMO | Source: Ambulatory Visit | Attending: Family Medicine | Admitting: Family Medicine

## 2021-10-17 ENCOUNTER — Encounter: Payer: Self-pay | Admitting: Family Medicine

## 2021-10-17 VITALS — BP 138/72 | HR 90 | Temp 97.7°F | Wt 223.6 lb

## 2021-10-17 DIAGNOSIS — R1011 Right upper quadrant pain: Secondary | ICD-10-CM | POA: Insufficient documentation

## 2021-10-17 DIAGNOSIS — M7918 Myalgia, other site: Secondary | ICD-10-CM | POA: Diagnosis not present

## 2021-10-17 MED ORDER — BACLOFEN 10 MG PO TABS: 10.0000 mg | ORAL_TABLET | Freq: Three times a day (TID) | ORAL | 0 refills | Status: DC | PRN

## 2021-10-17 NOTE — Patient Instructions (Signed)
Heat.  Medication as prescribed.  Labs today.  We will call about [place].  Take care  Dr. Lacinda Axon

## 2021-10-18 DIAGNOSIS — R1011 Right upper quadrant pain: Secondary | ICD-10-CM | POA: Insufficient documentation

## 2021-10-18 DIAGNOSIS — M7918 Myalgia, other site: Secondary | ICD-10-CM | POA: Insufficient documentation

## 2021-10-18 LAB — CMP14+EGFR
ALT: 16 IU/L (ref 0–44)
AST: 16 IU/L (ref 0–40)
Albumin/Globulin Ratio: 1.8 (ref 1.2–2.2)
Albumin: 4.5 g/dL (ref 3.9–4.9)
Alkaline Phosphatase: 80 IU/L (ref 44–121)
BUN/Creatinine Ratio: 25 — ABNORMAL HIGH (ref 10–24)
BUN: 34 mg/dL — ABNORMAL HIGH (ref 8–27)
Bilirubin Total: <0.2 mg/dL (ref 0.0–1.2)
CO2: 23 mmol/L (ref 20–29)
Calcium: 9.6 mg/dL (ref 8.6–10.2)
Chloride: 98 mmol/L (ref 96–106)
Creatinine, Ser: 1.37 mg/dL — ABNORMAL HIGH (ref 0.76–1.27)
Globulin, Total: 2.5 g/dL (ref 1.5–4.5)
Glucose: 315 mg/dL — ABNORMAL HIGH (ref 70–99)
Potassium: 4.2 mmol/L (ref 3.5–5.2)
Sodium: 139 mmol/L (ref 134–144)
Total Protein: 7 g/dL (ref 6.0–8.5)
eGFR: 55 mL/min/{1.73_m2} — ABNORMAL LOW (ref >59)

## 2021-10-18 LAB — CBC
Hematocrit: 35.7 % — ABNORMAL LOW (ref 37.5–51.0)
Hemoglobin: 11.9 g/dL — ABNORMAL LOW (ref 13.0–17.7)
MCH: 30.4 pg (ref 26.6–33.0)
MCHC: 33.3 g/dL (ref 31.5–35.7)
MCV: 91 fL (ref 79–97)
Platelets: 202 10*3/uL (ref 150–450)
RBC: 3.91 x10E6/uL — ABNORMAL LOW (ref 4.14–5.80)
RDW: 13.5 % (ref 11.6–15.4)
WBC: 7.9 10*3/uL (ref 3.4–10.8)

## 2021-10-18 LAB — LIPASE: Lipase: 72 U/L (ref 13–78)

## 2021-10-18 NOTE — Assessment & Plan Note (Signed)
Pain appears to be predominantly muscular in nature.  Spasm.  Treating with baclofen.

## 2021-10-18 NOTE — Progress Notes (Signed)
Subjective:  Patient ID: Paul Baker, male    DOB: Dec 01, 1950  Age: 71 y.o. MRN: [id number]  CC: Chief Complaint  Patient presents with   Shoulder Pain    Pt arrives with left shoulder and neck pain. Left shoulder and neck swollen this morning. Began on Friday and has worsened. Dizziness after bending over. Lump on right side-noticed after eating about one week ago.    HPI:  71 year old male with an extensive past medical history presents for evaluation of the above.  Patient also reports ongoing right upper quadrant pain.  Patient reports that he had pain of the left side of the neck into the trapezius region last week.  States that it has not improved.  In fact, it has worsened.  He reports feeling that the area is swollen.  He is tried ice without resolution.  No recent fall, trauma, injury.  Area is tender to palpation and reports decreased range of motion.  Patient also reports that he has had intermittent right upper quadrant pain.  He recently had a bout after eating.  No reports of nausea or vomiting.  No fever.  No other associated symptoms.  No other complaints.  Patient Active Problem List   Diagnosis Date Noted   RUQ pain 10/18/2021   Musculoskeletal pain 10/18/2021   Grade I diastolic dysfunction 37/85/8850   Paroxysmal atrial fibrillation (HCC)    Hypothyroidism    Asthma 08/05/2019   Class 2 obesity 08/05/2019   Mixed hyperlipidemia 11/25/2014   S/P right TKA 03/23/2014   S/P knee replacement 03/23/2014   Spinal stenosis of cervicothoracic region 10/08/2013   RLS (restless legs syndrome) 10/08/2013   DM type 2 causing vascular disease ([place]) 10/08/2013   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/02/2013   Diabetic neuropathy ([place]) 07/23/2012   Obesity, morbid ([place]) 05/01/2012   OSA on CPAP 04/19/2012   Essential hypertension, benign 12/16/2009   CAD (coronary artery disease) 12/16/2009   Hyperglycemia due to type 2 diabetes mellitus ([place])  11/30/2009    Social Hx   Social History   Socioeconomic History   Marital status: Married    Spouse name: Information systems manager   Number of children: 2   Years of education: college   Highest education level: Not on file  Occupational History   Occupation: Disabled    Employer: UNEMPLOYED  Tobacco Use   Smoking status: Former    Packs/day: 1.50    Years: 10.00    Total pack years: 15.00    Types: Cigarettes    Start date: 06/19/1967    Quit date: 01/03/1995    Years since quitting: 26.8   Smokeless tobacco: Never  Vaping Use   Vaping Use: Never used  Substance and Sexual Activity   Alcohol use: No    Alcohol/week: 0.0 standard drinks of alcohol    Comment: quit drinking in 07/86   Drug use: No   Sexual activity: Yes    Partners: Female  Other Topics Concern   Not on file  Social History Narrative    71 year old, right-handed, caucasian male with a past medical history of obesity, hypertension, hyperlipidemia, diabetes, obstructive sleep apnea, presenting with frequent nighttime awakenings, excessive daytime sleepiness, also transient confusional episodes.RLS and one beosity, OSA on CPAP with AHI of 3.2 and  setting of 16 cm water , [facility] .   Married since 1977.   Social Determinants of Health   Financial Resource Strain: Low Risk  (01/18/2021)   Overall Emergency planning/management officer Strain (  CARDIA)    Difficulty of Paying Living Expenses: Not hard at all  Food Insecurity: No Food Insecurity (01/18/2021)   Hunger Vital Sign    Worried About Running Out of Food in the Last Year: Never true    Ran Out of Food in the Last Year: Never true  Transportation Needs: No Transportation Needs (01/18/2021)   PRAPARE - Hydrologist (Medical): No    Lack of Transportation (Non-Medical): No  Physical Activity: Sufficiently Active (01/18/2021)   Exercise Vital Sign    Days of Exercise per Week: 3 days    Minutes of Exercise per Session: 60 min  Stress: No Stress Concern  Present (01/18/2021)   [place]    Feeling of Stress : Only a little  Social Connections: Socially Integrated (01/18/2021)   Social Connection and Isolation Panel [NHANES]    Frequency of Communication with Friends and Family: More than three times a week    Frequency of Social Gatherings with Friends and Family: More than three times a week    Attends Religious Services: More than 4 times per year    Active Member of [employer] or Organizations: Yes    Attends Music therapist: More than 4 times per year    Marital Status: Married    Review of Systems Per HPI  Objective:  BP 138/72   Pulse 90   Temp 97.7 F (36.5 C)   Wt 223 lb 9.6 oz (101.4 kg)   SpO2 97%   BMI 37.21 kg/m      10/17/2021    1:31 PM 10/05/2021   10:30 AM 09/19/2021    3:16 PM  BP/Weight  Systolic BP 268 341 962  Diastolic BP 72 72 69  Wt. (Lbs) 223.6 222 222  BMI 37.21 kg/m2 36.94 kg/m2 38.11 kg/m2    Physical Exam Constitutional:      General: He is not in acute distress.    Appearance: Normal appearance. He is obese.  HENT:     Head: Normocephalic and atraumatic.  Neck:      Comments: Tenderness to palpation at the labelled location. No appreciable mass or lymphadenopathy. Cardiovascular:     Rate and Rhythm: Normal rate and regular rhythm.  Pulmonary:     Effort: Pulmonary effort is normal.     Breath sounds: Normal breath sounds.  Abdominal:     General: There is no distension.     Palpations: Abdomen is soft.     Comments: Mild right upper quadrant tenderness to palpation.  Neurological:     Mental Status: He is alert.  Psychiatric:        Mood and Affect: Mood normal.        Behavior: Behavior normal.     Lab Results  Component Value Date   WBC 7.9 10/17/2021   HGB 11.9 (L) 10/17/2021   HCT 35.7 (L) 10/17/2021   PLT 202 10/17/2021   GLUCOSE 315 (H) 10/17/2021   CHOL 166 04/05/2021   TRIG 226 (H)  04/05/2021   HDL 55 04/05/2021   LDLCALC 74 04/05/2021   ALT 16 10/17/2021   AST 16 10/17/2021   NA 139 10/17/2021   K 4.2 10/17/2021   CL 98 10/17/2021   CREATININE 1.37 (H) 10/17/2021   BUN 34 (H) 10/17/2021   CO2 23 10/17/2021   TSH 0.812 02/23/2021   PSA 2.03 11/13/2013   INR 0.9 08/05/2019   HGBA1C 9.5 (A)  09/09/2021   MICROALBUR 10 06/09/2021     Assessment & Plan:   Problem List Items Addressed This Visit       Other   Musculoskeletal pain    Pain appears to be predominantly muscular in nature.  Spasm.  Treating with baclofen.      RUQ pain - Primary    Right upper quadrant ultrasound was obtained and was only notable for fatty liver.  Laboratory studies obtained (CBC, CMP, Lipase) and were reviewed by me. Labs unrevealing. Supportive care.  May need to see GI if continues to persist.      Relevant Orders   CBC (Completed)   CMP14+EGFR (Completed)   Lipase (Completed)   US Abdomen Limited RUQ (LIVER/GB) (Completed)    Meds ordered this encounter  Medications   baclofen (LIORESAL) 10 MG tablet    Sig: Take 1 tablet (10 mg total) by mouth 3 (three) times daily as needed for muscle spasms.    Dispense:  30 each    Refill:  0    Follow-up:  Return if symptoms worsen or fail to improve.  [place]

## 2021-10-18 NOTE — Assessment & Plan Note (Addendum)
Right upper quadrant ultrasound was obtained and was only notable for fatty liver.  Laboratory studies obtained (CBC, CMP, Lipase) and were reviewed by me. Labs unrevealing. Supportive care.  May need to see GI if continues to persist.

## 2021-10-26 NOTE — Telephone Encounter (Signed)
   Patient Name: Paul Baker  DOB: 02/06/1950 MRN: [id number]  Primary Cardiologist: [name], MD  Chart reviewed as part of pre-operative protocol coverage. Given past medical history and time since last visit, based on ACC/AHA guidelines, THADEUS GANDOLFI is at acceptable risk for the planned procedure without further cardiovascular testing.   Seen by Dr. Domenic Polite 10/05/21 deemed acceptable risk for procedure.   Per pharmacy review and office protocol, hold Eliquis 1 day prior to planned procedure. He is no longer on Aspirin.   The patient was advised that if he develops new symptoms prior to surgery to contact our office to arrange for a follow-up visit, and he verbalized understanding.  I will route this recommendation to the requesting party via Epic fax function and remove from pre-op pool.  Please call with questions.  Loel Dubonnet, NP 10/26/2021, 8:21 AM

## 2021-10-29 DIAGNOSIS — G4733 Obstructive sleep apnea (adult) (pediatric): Secondary | ICD-10-CM | POA: Diagnosis not present

## 2021-11-02 ENCOUNTER — Ambulatory Visit: Payer: Medicare HMO | Admitting: Neurology

## 2021-11-02 ENCOUNTER — Encounter: Payer: Self-pay | Admitting: Neurology

## 2021-11-02 VITALS — BP 129/68 | HR 87 | Ht 65.0 in | Wt 219.5 lb

## 2021-11-02 DIAGNOSIS — I5189 Other ill-defined heart diseases: Secondary | ICD-10-CM | POA: Diagnosis not present

## 2021-11-02 DIAGNOSIS — I480 Paroxysmal atrial fibrillation: Secondary | ICD-10-CM

## 2021-11-02 DIAGNOSIS — G2581 Restless legs syndrome: Secondary | ICD-10-CM

## 2021-11-02 DIAGNOSIS — Z96653 Presence of artificial knee joint, bilateral: Secondary | ICD-10-CM | POA: Diagnosis not present

## 2021-11-02 DIAGNOSIS — G4733 Obstructive sleep apnea (adult) (pediatric): Secondary | ICD-10-CM | POA: Diagnosis not present

## 2021-11-02 DIAGNOSIS — G4719 Other hypersomnia: Secondary | ICD-10-CM

## 2021-11-02 DIAGNOSIS — G629 Polyneuropathy, unspecified: Secondary | ICD-10-CM | POA: Diagnosis not present

## 2021-11-02 DIAGNOSIS — E1142 Type 2 diabetes mellitus with diabetic polyneuropathy: Secondary | ICD-10-CM | POA: Diagnosis not present

## 2021-11-02 MED ORDER — ROPINIROLE HCL 4 MG PO TABS: 2.0000 mg | ORAL_TABLET | Freq: Two times a day (BID) | ORAL | 3 refills | Status: DC

## 2021-11-02 MED ORDER — MODAFINIL 200 MG PO TABS: 200.0000 mg | ORAL_TABLET | Freq: Every day | ORAL | 1 refills | Status: DC

## 2021-11-02 NOTE — Progress Notes (Signed)
SLEEP MEDICINE CLINIC    Provider:  Larey Seat, MD  Primary Care Physician:  Kathyrn Drown, [place] [id number]     Referring Provider: Kathyrn Drown, [place],  [place] [id number]          Chief Complaint according to patient   Patient presents with:     New Patient (Initial Visit)           HISTORY OF PRESENT ILLNESS:  Paul Baker is a 71 y.o. year old White or Caucasian male patient seen here in a RV on 11/02/2021 who is followed here for RLS and OSA on PAP therapy, he last 5 visit were with our NP Debbora Presto, NP, Chief concern according to patient :  " my RLS is worse, I am dragging my feet and I have tripped and fell, and I use my CPAP machine. I fell on an uneven path, and my feet are numb. "    Paul Baker  has a past medical history of Arthritis, Asthma, Atrial fibrillation ([place]), Colon polyp, Coronary atherosclerosis of native coronary artery, DVT (deep venous thrombosis) ([place]) (2005), Essential hypertension, Headache, History of transfusion, Hypothyroidism, Mixed hyperlipidemia, Morbid obesity ([place]), MRSA (methicillin resistant staph aureus) culture positive, Narcolepsy, OSA (obstructive sleep apnea), Osteoarthritis, Pneumonia, Psoriasis, Pulmonary embolism ([place]) (2004), RLS (restless legs syndrome), Rotator cuff disorder, Septic arthritis of knee, left ([place]), Skin cancer, basal cell, and Type 2 diabetes mellitus ([place])..      Family medical Rachelle Hora history: No other family member on CPAP with OSA, father had RLS, daughter has RLS,  Sister with RLS>   Social history:  Patient is retired since age 59 , caused by /from infected knee replacement with sepsis, and lives in a household with spouse. His dog just died.  Tobacco use" not in 25 years"-.   ETOH use : none,  Caffeine intake in form of Coffee( 1-2/ day) Soda( 1 a day) Tea ( 2-3 a day), nor energy drinks. Regular exercise in form of walking .         Summary of PAP compliance: The patient has used his CPAP machine 27 out of 30 days but he is rarely able to tolerate 4 hours consecutively so on only 13 of these days that he uses machine 4 hours or longer.  Average user time is 4 hours and 21 minutes however the CPAP is set at 11 cm water with 3 cm expiratory relief and his residual AHI is 2.2/h which is a good resolution.  He has moderate air leakage.  This paper does not state when the patient was originally set up with his CPAP or how old his CPAP currently is.   Addendum Set up 2022/ [employer] [facility]  I reviewed the patient's current labs his lipase was in normal range this is from October 17 2021. Complex metabolic panel and GFR he had a very high glucose level, 315 mg/dL.  BUN was elevated at 34 mg/dL, creatinine is elevated at 1.37 mg/dL, BUN/creatinine ratio was 25 and estimated glomerular filtration rate was 55, sodium was normal potassium was normal, AST and ALT as liver function tests were normal bilirubin in normal range albumin was normal. Complex metabolic panel and GFR he had a very high glucose level, 315 mg/dL.  BUN was elevated at 34 mg/dL, creatinine is elevated at 1.37 mg/dL, BUN/creatinine ratio was 25 and estimated glomerular filtration rate was 55, sodium  was normal potassium was normal, AST and ALT as liver function tests were normal bilirubin in normal range albumin was normal.  CRP was 4 mg/L -so this is an non-inflammatory process.    Review of Systems: Out of a complete 14 system review, the patient complains of only the following symptoms, and all other reviewed systems are negative.:  Fatigue, sleepiness , snoring, fragmented sleep, RLS    How likely are you to doze in the following situations: 0 = not likely, 1 = slight chance, 2 = moderate chance, 3 = high chance   Sitting and Reading? Watching Television? Sitting inactive in a public place (theater or meeting)? As a passenger in a car for an hour without a  break? Lying down in the afternoon when circumstances permit? Sitting and talking to someone? Sitting quietly after lunch without alcohol? In a car, while stopped for a few minutes in traffic?   Total = 20/ 24 points   FSS endorsed at 34/ 63 points.   SEVERE RLS , every day- symptoms start at noon.   Social History   Socioeconomic History   Marital status: Married    Spouse name: Information systems manager   Number of children: 2   Years of education: college   Highest education level: Not on file  Occupational History   Occupation: Disabled    Employer: UNEMPLOYED  Tobacco Use   Smoking status: Former    Packs/day: 1.50    Years: 10.00    Total pack years: 15.00    Types: Cigarettes    Start date: 06/19/1967    Quit date: 01/03/1995    Years since quitting: 26.8   Smokeless tobacco: Never  Vaping Use   Vaping Use: Never used  Substance and Sexual Activity   Alcohol use: No    Alcohol/week: 0.0 standard drinks of alcohol    Comment: quit drinking in 07/86   Drug use: No   Sexual activity: Yes    Partners: Female  Other Topics Concern   Not on file  Social History Narrative    71 year old, right-handed, caucasian male with a past medical history of obesity, hypertension, hyperlipidemia, diabetes, obstructive sleep apnea, presenting with frequent nighttime awakenings, excessive daytime sleepiness, also transient confusional episodes.RLS and one beosity, OSA on CPAP with AHI of 3.2 and  setting of 16 cm water , [facility] .   Married since 1977.   Social Determinants of Health   Financial Resource Strain: Low Risk  (01/18/2021)   Overall Financial Resource Strain (CARDIA)    Difficulty of Paying Living Expenses: Not hard at all  Food Insecurity: No Food Insecurity (01/18/2021)   Hunger Vital Sign    Worried About Running Out of Food in the Last Year: Never true    Ran Out of Food in the Last Year: Never true  Transportation Needs: No Transportation Needs (01/18/2021)   PRAPARE -  Hydrologist (Medical): No    Lack of Transportation (Non-Medical): No  Physical Activity: Sufficiently Active (01/18/2021)   Exercise Vital Sign    Days of Exercise per Week: 3 days    Minutes of Exercise per Session: 60 min  Stress: No Stress Concern Present (01/18/2021)   [place]    Feeling of Stress : Only a little  Social Connections: Socially Integrated (01/18/2021)   Social Connection and Isolation Panel [NHANES]    Frequency of Communication with Friends and Family: More than three times a  week    Frequency of Social Gatherings with Friends and Family: More than three times a week    Attends Religious Services: More than 4 times per year    Active Member of Clubs or Organizations: Yes    Attends Music therapist: More than 4 times per year    Marital Status: Married    Family History  Problem Relation Age of Onset   Hypertension Father    Heart attack Father    Kidney Stones Father    Seizures Grandchild    Narcolepsy Grandchild    Diabetes Sister     Past Medical History:  Diagnosis Date   Arthritis    Asthma    Atrial fibrillation [facility])    Diagnosed December 2021   Colon polyp    Coronary atherosclerosis of native coronary artery    a. 2011: cath showing 90% stenosis along small non-dominant RCA (too small for PCI). b. 01/2018: cath showing nonobstructive CAD with 60 to 70% proximal to mid nondominant RCA stenosis, 50% mid LAD and 40 to 50% OM1   DVT (deep venous thrombosis) ([place]) 2005   Right arm   Essential hypertension    Headache    History of transfusion    Hypothyroidism    Mixed hyperlipidemia    Morbid obesity ([place])    MRSA (methicillin resistant staph aureus) culture positive    08/2012   Narcolepsy    OSA (obstructive sleep apnea)    CPAP   Osteoarthritis    Pneumonia    Psoriasis    Pulmonary embolism ([place]) 2004   RLS (restless legs  syndrome)    Rotator cuff disorder    Left   Septic arthritis of knee, left ([facility])    Skin cancer, basal cell    Type 2 diabetes mellitus ([place])     Past Surgical History:  Procedure Laterality Date   BACK SURGERY     CATARACT EXTRACTION Bilateral    COLONOSCOPY     CYST REMOVAL TRUNK     from back   EYE SURGERY Left 2016   laser to left eye   HIP SURGERY     bone removed from both sides of hip   KNEE ARTHROTOMY Right 12/04/2014   Procedure: KNEE ARTHROTOMY PATELLA LIGAMENT RECONSTRUSION AND REPAIR RIGHT KNEE;  Surgeon: Paralee Cancel, MD;  Location: [facility];  Service: Orthopedics;  Laterality: Right;   KNEE SURGERY     X 25 TIMES   LEFT HEART CATH AND CORONARY ANGIOGRAPHY N/A 01/17/2018   Procedure: LEFT HEART CATH AND CORONARY ANGIOGRAPHY;  Surgeon: Belva Crome, MD;  Location: [facility];  Service: Cardiovascular;  Laterality: N/A;   LUMBAR DISC SURGERY     Left L3, L4, L5 discecotomy with decompression of L4 root   TONSILLECTOMY     TOTAL KNEE ARTHROPLASTY  2003   LEFT   TOTAL KNEE ARTHROPLASTY Right 03/23/2014   Procedure: RIGHT TOTAL KNEE ARTHROPLASTY AND REMOVAL RIGHT TIBIAL  DEEP IMPLANT STAPLE;  Surgeon: Paralee Cancel, MD;  Location: [facility];  Service: Orthopedics;  Laterality: Right;   TOTAL KNEE REVISION  2005   LEFT   WRIST SURGERY       Current Outpatient Medications on File Prior to Visit  Medication Sig Dispense Refill   acetaminophen (TYLENOL) 325 MG tablet Take 2 tablets (650 mg total) by mouth every 6 (six) hours as needed for mild pain (or Fever >/= 101). 30 tablet 1   albuterol (PROVENTIL) (2.5 MG/3ML)  0.083% nebulizer solution Take 3 mLs (2.5 mg total) by nebulization every 6 (six) hours as needed for up to 7 days for wheezing or shortness of breath. 84 mL 0   albuterol (VENTOLIN HFA) 108 (90 Base) MCG/ACT inhaler Inhale 2 puffs into the lungs every 6 (six) hours as needed for wheezing or shortness of breath. 18 g 5   alfuzosin (UROXATRAL) 10 MG 24 hr  tablet Take 10 mg by mouth daily.     apixaban (ELIQUIS) 5 MG TABS tablet Take 1 tablet (5 mg total) by mouth 2 (two) times daily. For stroke prevention 180 tablet 3   baclofen (LIORESAL) 10 MG tablet Take 1 tablet (10 mg total) by mouth 3 (three) times daily as needed for muscle spasms. 30 each 0   ezetimibe (ZETIA) 10 MG tablet Take 1 tablet (10 mg total) by mouth daily. 90 tablet 3   Fluticasone-Umeclidin-Vilant (TRELEGY ELLIPTA) 100-62.5-25 MCG/ACT AEPB INHALE 1 PUFF INTO LUNGS ONCE DAILY 60 each 6   furosemide (LASIX) 40 MG tablet Take by mouth. Take '40mg'$  daily alternating with '80mg'$  every other day.     Glucagon (GVOKE HYPOPEN 2-PACK) 1 MG/0.2ML SOAJ Inject 1 mg subcutaneously once as needed for low blood sugar. May repeat dose in 15 minutes as needed using a new device. 0.4 mL 1   Insulin Pen Needle (B-D ULTRAFINE III SHORT PEN) 31G X 8 MM MISC USE 1 PEN NEEDLE THREE TIMES DAILY 100 each 2   insulin regular human CONCENTRATED (HUMULIN R U-500 KWIKPEN) 500 UNIT/ML KwikPen Inject 75-100 Units into the skin 3 (three) times daily with meals. Inject 85 units with breakfast and lunch, and 65 units with supper if glucose is above 90 and you are eating 60 mL 2   levothyroxine (SYNTHROID) 137 MCG tablet TAKE 1 TABLET BY MOUTH ONCE DAILY BEFORE BREAKFAST 90 tablet 0   loratadine (CLARITIN) 10 MG tablet Take 10 mg by mouth daily as needed for allergies.      losartan (COZAAR) 100 MG tablet Take 100 mg by mouth daily.     metoprolol succinate (TOPROL XL) 25 MG 24 hr tablet Take 1 tablet (25 mg total) by mouth daily as needed (for prolonged palpitations). 90 tablet 0   metoprolol succinate (TOPROL-XL) 100 MG 24 hr tablet Take 1 tablet (100 mg total) by mouth 2 (two) times daily. 180 tablet 1   modafinil (PROVIGIL) 200 MG tablet Take 1 tablet (200 mg total) by mouth daily. 90 tablet 1   MYRBETRIQ 50 MG TB24 tablet Take 50 mg by mouth daily.     nitroGLYCERIN (NITROSTAT) 0.4 MG SL tablet DISSOLVE 1 TABLET  UNDER TONGUE EVERY 5 MINUTES UP TO 15 MIN FOR CHESTPAIN. IF NO RELIEF CALL 911. 25 tablet 3   ondansetron (ZOFRAN-ODT) 4 MG disintegrating tablet Take 1 tablet (4 mg total) by mouth every 8 (eight) hours as needed for nausea or vomiting. 20 tablet 0   ONE TOUCH ULTRA TEST test strip TEST BLOOD SUGAR UP TO 4 TIMES DAILY. 150 each 5   ONETOUCH DELICA LANCETS 06Y MISC USE AS DIRECTED TO TEST BLOOD SUGAR 4 TIMES DAILY. 150 each 5   pantoprazole (PROTONIX) 40 MG tablet Take 40 mg by mouth daily.     pravastatin (PRAVACHOL) 40 MG tablet TAKE 1 TABLET BY MOUTH ONCE DAILY IN THE EVENING 90 tablet 1   rOPINIRole (REQUIP XL) 4 MG 24 hr tablet Take 1 tablet (4 mg total) by mouth at bedtime. 90 tablet 3  rOPINIRole (REQUIP) 4 MG tablet Take 0.5 tablets (2 mg total) by mouth 2 (two) times daily. 90 tablet 3   Semaglutide, 1 MG/DOSE, 4 MG/3ML SOPN Inject 1 mg as directed once a week. 6 mL 3   No current facility-administered medications on file prior to visit.    Allergies  Allergen Reactions   Tape Rash    Pulls off skin   Cardizem [Diltiazem Hcl]     Edema    Cardura [Doxazosin Mesylate]     Headaches / cramps   Crestor [Rosuvastatin] Other (See Comments)    Leg cramps   Lipitor [Atorvastatin] Other (See Comments)    Leg cramps   Paxil [Paroxetine Hcl]     Unknown reaction    Codeine Rash and Other (See Comments)    Headache    Gabapentin Rash    Physical exam:  Today's Vitals   11/02/21 1349  BP: 129/68  Pulse: 87  Weight: 219 lb 8 oz (99.6 kg)  Height: '5\' 5"'$  (1.651 m)   Body mass index is 36.53 kg/m.   Wt Readings from Last 3 Encounters:  11/02/21 219 lb 8 oz (99.6 kg)  10/17/21 223 lb 9.6 oz (101.4 kg)  10/05/21 222 lb (100.7 kg)     Ht Readings from Last 3 Encounters:  11/02/21 '5\' 5"'$  (1.651 m)  10/05/21 '5\' 5"'$  (1.651 m)  09/19/21 '5\' 4"'$  (1.626 m)      General: The patient is awake, alert and appears not in acute distress. The patient is well groomed. Head:  Normocephalic, atraumatic. Neck is supple. Mallampati 3 plus ,  neck circumference:19. 5 inches . Nasal airflow barely patent.  Retrognathia is not seen. Facial hair  Dental status:  Cardiovascular:  Regular rate and cardiac rhythm by pulse,  without distended neck veins. Respiratory: Lungs are clear to auscultation.  Skin:  With evidence of ankle edema,. Trunk: The patient's posture is erect.   Neurologic exam : The patient is awake and alert, oriented to place and time.   Memory subjective described as intact.  Attention span & concentration ability appears normal.  Speech is fluent,  without  dysarthria, dysphonia or aphasia.  Mood and affect are appropriate.   Cranial nerves: no loss of smell or taste reported  Pupils are equal and briskly reactive to light. Funduscopic exam deferred. .  Extraocular movements in vertical and horizontal planes were intact and without nystagmus. No Diplopia. Visual fields by finger perimetry are intact. Hearing was intact to soft voice and finger rubbing.    Facial sensation intact to fine touch.  Facial motor strength is symmetric and tongue in midline.  Neck ROM : rotation, tilt and flexion extension were normal for age and shoulder shrug was symmetrical.    Motor exam:  Symmetric bulk, tone and ROM.   left foot weakness of dorsiflexion,  Normal tone without cog- wheeling, symmetric grip strength .    Sensory:  Fine touch, pinprick and vibration were absent over both knees and ankles.  Proprioception tested in the upper extremities was normal.   Coordination: Rapid alternating movements in the fingers/hands were of normal speed.  The Finger-to-nose maneuver was intact without evidence of ataxia or tremor.   Gait and station: Patient could rise unassisted from a seated position, walked without assistive device.  Stance is of normal width/ base and the patient turned with 4 steps.  Toe and heel walk were deferred.  Deep tendon reflexes: in the   upper and lower extremities are absent .  Babinski response was deferred.          After spending a total time of  35  minutes face to face and additional time for physical and neurologic examination, review of laboratory studies,  personal review of imaging studies, reports and results of other testing and review of referral information / records as far as provided in visit, I have established the following assessments:  Checked TSH, T3 and T4- normal.   1) EDS while apnea is well controlled- persistent hypersomnia in the context of RLS, ascending neuropathy, poorly controlled DM. =Diabetic neuropathy and genetic predisposition for RLS.  2) Anticipation of RLS on high doses of Requip.  3) sleepiness exacerbated by medication, Requip.  4) somewhat complaint   CPAP use,    My Plan is to proceed with:  1) I refilled amodafinil.  2) Requip 24 hours and immediate release , both are used at 4 mg each day.  3) I will recheck ferritin, TBC.   I would like to thank Kathyrn Drown, MD and Kathyrn Drown, [place] [place],  [place] [id number] for allowing me to meet with and to take care of this pleasant patient.   In short, Paul Baker is presenting with severe RLS with Neuropathy , uncontrolled DM , and well controlled sleep apnea.  I plan to follow up either personally or through our NP within 6-8  months.   CC: I will share my notes with PCP.[name]  Electronically signed by: Larey Seat, MD 11/02/2021 2:15 PM  [facility] Board certified by The [employer] of Sleep Medicine and Diplomate of the [employer] of Sleep Medicine. Board certified In Neurology through the [place], Fellow of the [employer] of Neurology. Medical Director of [employer].

## 2021-11-03 ENCOUNTER — Telehealth: Payer: Self-pay | Admitting: *Deleted

## 2021-11-03 ENCOUNTER — Ambulatory Visit (INDEPENDENT_AMBULATORY_CARE_PROVIDER_SITE_OTHER): Payer: Medicare HMO | Admitting: Family Medicine

## 2021-11-03 VITALS — BP 132/78 | HR 90 | Temp 97.7°F | Ht 65.0 in | Wt 222.0 lb

## 2021-11-03 DIAGNOSIS — M25551 Pain in right hip: Secondary | ICD-10-CM

## 2021-11-03 DIAGNOSIS — N289 Disorder of kidney and ureter, unspecified: Secondary | ICD-10-CM

## 2021-11-03 DIAGNOSIS — Z23 Encounter for immunization: Secondary | ICD-10-CM

## 2021-11-03 DIAGNOSIS — R269 Unspecified abnormalities of gait and mobility: Secondary | ICD-10-CM

## 2021-11-03 DIAGNOSIS — M25552 Pain in left hip: Secondary | ICD-10-CM | POA: Diagnosis not present

## 2021-11-03 LAB — IRON,TIBC AND FERRITIN PANEL
Ferritin: 155 ng/mL (ref 30–400)
Iron Saturation: 10 % — ABNORMAL LOW (ref 15–55)
Iron: 31 ug/dL — ABNORMAL LOW (ref 38–169)
Total Iron Binding Capacity: 308 ug/dL (ref 250–450)
UIBC: 277 ug/dL (ref 111–343)

## 2021-11-03 NOTE — Progress Notes (Signed)
Low free iron and  iron saturation but elevated/normal ferritin- unusual combination. We need to add transferrin to the next blood draw and C reactive protein/ sed rate. High ferritin can be a result of an inflammation, and does not necessarily correlate to iron metabolism. Can we ask Paul Baker to come back for these added tests or have them drawn by PCP?

## 2021-11-03 NOTE — Telephone Encounter (Signed)
LVM for pt about results and provided our office lab hours. Advised if he chooses to go to PCP, they will have to place the orders on their end. I also sent mychart and advised he can send any questions he may have back via mychart or call.

## 2021-11-03 NOTE — Progress Notes (Signed)
   Subjective:    Patient ID: Paul Baker, male    DOB: [birth year], 71 y.o.   MRN: [id number]  HPI Follow for labs kidney function and blood sugars Patient here for follow-up Reviewing over labs Reviewing over medicines Follows with specialists seen neurology they did some ferritin testing and doing some additional testing Also endocrinology is doing testing Patient also has cardiology following up   Review of Systems     Objective:   Physical Exam General-in no acute distress Eyes-no discharge Lungs-respiratory rate normal, CTA CV-no murmurs,RRR Extremities skin warm dry no edema Neuro grossly normal Behavior normal, alert        Assessment & Plan:   1. Renal insufficiency Check kidney function urine ACR to monitor for developing CKD healthy diet keep A1c blood pressure under good control - Basic Metabolic Panel - Microalbumin/Creatinine Ratio, Urine  2. Abnormal gait Patient does not pick up his feet well he complains of hip pain bilateral has decreased range of motion we will do some x-rays - XR HIP UNILAT W OR W/O PELVIS 2-3 VIEWS LEFT - XR HIP UNILAT W OR W/O PELVIS 2-3 VIEWS RIGHT  3. Bilateral hip pain X-rays ordered await the results - XR HIP UNILAT W OR W/O PELVIS 2-3 VIEWS LEFT - XR HIP UNILAT W OR W/O PELVIS 2-3 VIEWS RIGHT Follow-up in approximately 5 months possible referral to orthopedics depending on the x-rays I printed off some information regarding reducing ataxia 4. Immunization due Flu shot today - Flu Vaccine QUAD High Dose(Fluad)  Offered physical therapy patient states he cannot afford this currently

## 2021-11-03 NOTE — Addendum Note (Signed)
Addended by: Larey Seat on: 11/03/2021 08:55 AM   Modules accepted: Orders

## 2021-11-03 NOTE — Telephone Encounter (Signed)
-----   Message from Larey Seat, MD sent at 11/03/2021  8:55 AM EDT ----- Low free iron and  iron saturation but elevated/normal ferritin- unusual combination. We need to add transferrin to the next blood draw and C reactive protein/ sed rate. High ferritin can be a result of an inflammation, and does not necessarily correlate to iron metabolism. Can we ask Paul Baker to come back for these added tests or have them drawn by PCP?

## 2021-11-03 NOTE — Patient Instructions (Signed)
Shingrix and shingles prevention: know the facts!   Shingrix is a very effective vaccine to prevent shingles.   Shingles is a reactivation of chickenpox -more than 99% of Americans born before 1980 have had chickenpox even if they do not remember it. One in every 10 people who get shingles have severe long-lasting nerve pain as a result.   33 out of a 100 older adults will get shingles if they are unvaccinated.     This vaccine is very important for your health This vaccine is indicated for anyone 50 years or older. You can get this vaccine even if you have already had shingles because you can get the disease more than once in a lifetime.  Your risk for shingles and its complications increases with age.  This vaccine has 2 doses.  The second dose would be 2 to 6 months after the first dose.  If you had Zostavax vaccine in the past you should still get Shingrix. ( Zostavax is only 70% effective and it loses significant strength over a few years .)  This vaccine is given through the pharmacy.  The cost of the vaccine is through your insurance. The pharmacy can inform you of the total costs.  Common side effects including soreness in the arm, some redness and swelling, also some feel fatigue muscle soreness headache low-grade fever.  Side effects typically go away within 2 to 3 days. Remember-the pain from shingles can last a lifetime but these side effects of the vaccine will only last a few days at most. It is very important to get both doses in order to protect yourself fully.   Please get this vaccine at your earliest convenience at your trusted pharmacy.       

## 2021-11-07 ENCOUNTER — Encounter: Payer: Self-pay | Admitting: Neurology

## 2021-11-08 DIAGNOSIS — G2581 Restless legs syndrome: Secondary | ICD-10-CM | POA: Diagnosis not present

## 2021-11-08 DIAGNOSIS — I480 Paroxysmal atrial fibrillation: Secondary | ICD-10-CM | POA: Diagnosis not present

## 2021-11-08 DIAGNOSIS — E1142 Type 2 diabetes mellitus with diabetic polyneuropathy: Secondary | ICD-10-CM | POA: Diagnosis not present

## 2021-11-08 DIAGNOSIS — G4719 Other hypersomnia: Secondary | ICD-10-CM | POA: Diagnosis not present

## 2021-11-08 DIAGNOSIS — G629 Polyneuropathy, unspecified: Secondary | ICD-10-CM | POA: Diagnosis not present

## 2021-11-09 ENCOUNTER — Ambulatory Visit (HOSPITAL_COMMUNITY)
Admission: RE | Admit: 2021-11-09 | Discharge: 2021-11-09 | Disposition: A | Discharge status: Home / Self Care | Payer: Medicare HMO | Source: Ambulatory Visit | Attending: Family Medicine | Admitting: Family Medicine

## 2021-11-09 DIAGNOSIS — R269 Unspecified abnormalities of gait and mobility: Secondary | ICD-10-CM | POA: Diagnosis not present

## 2021-11-09 DIAGNOSIS — M25551 Pain in right hip: Secondary | ICD-10-CM | POA: Insufficient documentation

## 2021-11-09 DIAGNOSIS — M25552 Pain in left hip: Secondary | ICD-10-CM | POA: Insufficient documentation

## 2021-11-10 DIAGNOSIS — G4733 Obstructive sleep apnea (adult) (pediatric): Secondary | ICD-10-CM | POA: Diagnosis not present

## 2021-11-10 DIAGNOSIS — M179 Osteoarthritis of knee, unspecified: Secondary | ICD-10-CM | POA: Diagnosis not present

## 2021-11-11 ENCOUNTER — Other Ambulatory Visit: Payer: Self-pay | Admitting: Nurse Practitioner

## 2021-11-11 DIAGNOSIS — E1159 Type 2 diabetes mellitus with other circulatory complications: Secondary | ICD-10-CM

## 2021-11-11 LAB — TRANSFERRIN SATURATION
IRON SATN MFR SERPL: 20 % Saturation
IRON SERPL-MCNC: 69 ug/dL
TRANSFERRIN SERPL-MCNC: 242 mg/dL

## 2021-11-11 LAB — C-REACTIVE PROTEIN: CRP: 8 mg/L (ref 0–10)

## 2021-11-11 LAB — SEDIMENTATION RATE: Sed Rate: 54 mm/hr — ABNORMAL HIGH (ref 0–30)

## 2021-11-11 LAB — TRANSFERRIN: Transferrin: 248 mg/dL (ref 177–329)

## 2021-11-15 ENCOUNTER — Telehealth: Payer: Self-pay | Admitting: *Deleted

## 2021-11-15 DIAGNOSIS — E1110 Type 2 diabetes mellitus with ketoacidosis without coma: Secondary | ICD-10-CM | POA: Diagnosis not present

## 2021-11-15 NOTE — Telephone Encounter (Signed)
-----   Message from Britt Bottom, MD sent at 11/14/2021  7:04 PM EST ----- Please let him know that the one lab that was elevated a month ago is still increased (sed rate).  However, other labs for inflammation were normal so the increased lab value is less likely to be important.

## 2021-11-16 DIAGNOSIS — K219 Gastro-esophageal reflux disease without esophagitis: Secondary | ICD-10-CM | POA: Diagnosis not present

## 2021-11-16 DIAGNOSIS — R197 Diarrhea, unspecified: Secondary | ICD-10-CM | POA: Diagnosis not present

## 2021-11-16 DIAGNOSIS — R1030 Lower abdominal pain, unspecified: Secondary | ICD-10-CM | POA: Diagnosis not present

## 2021-11-16 DIAGNOSIS — Z86010 Personal history of colonic polyps: Secondary | ICD-10-CM | POA: Diagnosis not present

## 2021-11-16 DIAGNOSIS — D509 Iron deficiency anemia, unspecified: Secondary | ICD-10-CM | POA: Diagnosis not present

## 2021-11-22 ENCOUNTER — Telehealth: Payer: Self-pay | Admitting: Nurse Practitioner

## 2021-11-22 NOTE — Telephone Encounter (Signed)
Pt has pt assistance ozempic ready for p/u. Pt is aware

## 2021-11-23 ENCOUNTER — Telehealth: Payer: Self-pay | Admitting: Nurse Practitioner

## 2021-11-23 ENCOUNTER — Other Ambulatory Visit: Payer: Self-pay

## 2021-11-23 ENCOUNTER — Ambulatory Visit: Payer: Medicare HMO | Admitting: Neurology

## 2021-11-23 NOTE — Telephone Encounter (Signed)
Is pt supposed to be on metformin? A refill was requested but I do not see this in notes or medicine list.

## 2021-11-23 NOTE — Telephone Encounter (Signed)
No, I dont see where we discussed it in the past.  Perhaps he was getting it from someone else?  Either way, he does not need it at this time due to recent drop in kidney function.

## 2021-11-29 DIAGNOSIS — G4733 Obstructive sleep apnea (adult) (pediatric): Secondary | ICD-10-CM | POA: Diagnosis not present

## 2021-12-01 ENCOUNTER — Ambulatory Visit: Payer: Medicare HMO | Admitting: Neurology

## 2021-12-06 DIAGNOSIS — N289 Disorder of kidney and ureter, unspecified: Secondary | ICD-10-CM | POA: Diagnosis not present

## 2021-12-06 DIAGNOSIS — E038 Other specified hypothyroidism: Secondary | ICD-10-CM | POA: Diagnosis not present

## 2021-12-07 ENCOUNTER — Other Ambulatory Visit: Payer: Self-pay | Admitting: Pulmonary Disease

## 2021-12-07 ENCOUNTER — Other Ambulatory Visit: Payer: Self-pay | Admitting: Internal Medicine

## 2021-12-07 LAB — T4, FREE: Free T4: 1.63 ng/dL (ref 0.82–1.77)

## 2021-12-07 LAB — TSH: TSH: 1.23 u[IU]/mL (ref 0.450–4.500)

## 2021-12-08 LAB — MICROALBUMIN / CREATININE URINE RATIO
Creatinine, Urine: 14.4 mg/dL
Microalb/Creat Ratio: 272 mg/g creat — ABNORMAL HIGH (ref 0–29)
Microalbumin, Urine: 39.1 ug/mL

## 2021-12-08 LAB — BASIC METABOLIC PANEL
BUN/Creatinine Ratio: 18 (ref 10–24)
BUN: 23 mg/dL (ref 8–27)
CO2: 25 mmol/L (ref 20–29)
Calcium: 9.9 mg/dL (ref 8.6–10.2)
Chloride: 97 mmol/L (ref 96–106)
Creatinine, Ser: 1.27 mg/dL (ref 0.76–1.27)
Glucose: 221 mg/dL — ABNORMAL HIGH (ref 70–99)
Potassium: 4.3 mmol/L (ref 3.5–5.2)
Sodium: 138 mmol/L (ref 134–144)
eGFR: 61 mL/min/{1.73_m2} (ref >59)

## 2021-12-12 ENCOUNTER — Encounter: Payer: Self-pay | Admitting: Nurse Practitioner

## 2021-12-12 ENCOUNTER — Ambulatory Visit: Payer: Medicare HMO | Admitting: Nurse Practitioner

## 2021-12-12 VITALS — BP 142/72 | HR 86 | Ht 65.0 in | Wt 227.4 lb

## 2021-12-12 DIAGNOSIS — E782 Mixed hyperlipidemia: Secondary | ICD-10-CM | POA: Diagnosis not present

## 2021-12-12 DIAGNOSIS — E1159 Type 2 diabetes mellitus with other circulatory complications: Secondary | ICD-10-CM

## 2021-12-12 DIAGNOSIS — E038 Other specified hypothyroidism: Secondary | ICD-10-CM | POA: Diagnosis not present

## 2021-12-12 DIAGNOSIS — I10 Essential (primary) hypertension: Secondary | ICD-10-CM | POA: Diagnosis not present

## 2021-12-12 LAB — POCT GLYCOSYLATED HEMOGLOBIN (HGB A1C): Hemoglobin A1C: 8.9 % — AB (ref 4.0–5.6)

## 2021-12-12 NOTE — Progress Notes (Signed)
12/12/2021   Endocrinology follow-up note   Subjective:    Patient ID: Paul Baker, male    DOB: 1950-07-05, PCP Kathyrn Drown, MD   Past Medical History:  Diagnosis Date   Arthritis    Asthma    Atrial fibrillation [facility])    Diagnosed December 2021   Colon polyp    Coronary atherosclerosis of native coronary artery    a. 2011: cath showing 90% stenosis along small non-dominant RCA (too small for PCI). b. 01/2018: cath showing nonobstructive CAD with 60 to 70% proximal to mid nondominant RCA stenosis, 50% mid LAD and 40 to 50% OM1   DVT (deep venous thrombosis) ([place]) 2005   Right arm   Essential hypertension    Headache    History of transfusion    Hypothyroidism    Mixed hyperlipidemia    Morbid obesity ([place])    MRSA (methicillin resistant staph aureus) culture positive    08/2012   Narcolepsy    OSA (obstructive sleep apnea)    CPAP   Osteoarthritis    Pneumonia    Psoriasis    Pulmonary embolism ([place]) 2004   RLS (restless legs syndrome)    Rotator cuff disorder    Left   Septic arthritis of knee, left (HCC)    Skin cancer, basal cell    Type 2 diabetes mellitus ([place])    Past Surgical History:  Procedure Laterality Date   BACK SURGERY     CATARACT EXTRACTION Bilateral    COLONOSCOPY     CYST REMOVAL TRUNK     from back   EYE SURGERY Left 2016   laser to left eye   HIP SURGERY     bone removed from both sides of hip   KNEE ARTHROTOMY Right 12/04/2014   Procedure: KNEE ARTHROTOMY PATELLA LIGAMENT RECONSTRUSION AND REPAIR RIGHT KNEE;  Surgeon: Paralee Cancel, MD;  Location: [place];  Service: Orthopedics;  Laterality: Right;   KNEE SURGERY     X 25 TIMES   LEFT HEART CATH AND CORONARY ANGIOGRAPHY N/A 01/17/2018   Procedure: LEFT HEART CATH AND CORONARY ANGIOGRAPHY;  Surgeon: Belva Crome, MD;  Location: [place] [facility];  Service: Cardiovascular;  Laterality: N/A;   LUMBAR DISC SURGERY     Left L3, L4, L5 discecotomy with decompression of L4 root    TONSILLECTOMY     TOTAL KNEE ARTHROPLASTY  2003   LEFT   TOTAL KNEE ARTHROPLASTY Right 03/23/2014   Procedure: RIGHT TOTAL KNEE ARTHROPLASTY AND REMOVAL RIGHT TIBIAL  DEEP IMPLANT STAPLE;  Surgeon: Paralee Cancel, MD;  Location: [facility];  Service: Orthopedics;  Laterality: Right;   TOTAL KNEE REVISION  2005   LEFT   WRIST SURGERY     Social History   Socioeconomic History   Marital status: Married    Spouse name: Oleta   Number of children: 2   Years of education: college   Highest education level: Not on file  Occupational History   Occupation: Disabled    Employer: UNEMPLOYED  Tobacco Use   Smoking status: Former    Packs/day: 1.50    Years: 10.00    Total pack years: 15.00    Types: Cigarettes    Start date: 06/19/1967    Quit date: 01/03/1995    Years since quitting: 26.9   Smokeless tobacco: Never  Vaping Use   Vaping Use: Never used  Substance and Sexual Activity   Alcohol use: No    Alcohol/week: 0.0 standard drinks  of alcohol    Comment: quit drinking in 07/86   Drug use: No   Sexual activity: Yes    Partners: Female  Other Topics Concern   Not on file  Social History Narrative    71 year old, right-handed, caucasian male with a past medical history of obesity, hypertension, hyperlipidemia, diabetes, obstructive sleep apnea, presenting with frequent nighttime awakenings, excessive daytime sleepiness, also transient confusional episodes.RLS and one beosity, OSA on CPAP with AHI of 3.2 and  setting of 16 cm water , [facility] .   Married since 1977.   Social Determinants of Health   Financial Resource Strain: Low Risk  (01/18/2021)   Overall Financial Resource Strain (CARDIA)    Difficulty of Paying Living Expenses: Not hard at all  Food Insecurity: No Food Insecurity (01/18/2021)   Hunger Vital Sign    Worried About Running Out of Food in the Last Year: Never true    Ran Out of Food in the Last Year: Never true  Transportation Needs: No Transportation Needs  (01/18/2021)   PRAPARE - Hydrologist (Medical): No    Lack of Transportation (Non-Medical): No  Physical Activity: Sufficiently Active (01/18/2021)   Exercise Vital Sign    Days of Exercise per Week: 3 days    Minutes of Exercise per Session: 60 min  Stress: No Stress Concern Present (01/18/2021)   [place]    Feeling of Stress : Only a little  Social Connections: Socially Integrated (01/18/2021)   Social Connection and Isolation Panel [NHANES]    Frequency of Communication with Friends and Family: More than three times a week    Frequency of Social Gatherings with Friends and Family: More than three times a week    Attends Religious Services: More than 4 times per year    Active Member of [employer] or Organizations: Yes    Attends Archivist Meetings: More than 4 times per year    Marital Status: Married   Outpatient Encounter Medications as of 12/12/2021  Medication Sig   acetaminophen (TYLENOL) 325 MG tablet Take 2 tablets (650 mg total) by mouth every 6 (six) hours as needed for mild pain (or Fever >/= 101).   albuterol (PROVENTIL) (2.5 MG/3ML) 0.083% nebulizer solution Take 3 mLs (2.5 mg total) by nebulization every 6 (six) hours as needed for up to 7 days for wheezing or shortness of breath.   albuterol (VENTOLIN HFA) 108 (90 Base) MCG/ACT inhaler Inhale 2 puffs into the lungs every 6 (six) hours as needed for wheezing or shortness of breath.   alfuzosin (UROXATRAL) 10 MG 24 hr tablet Take 10 mg by mouth daily.   apixaban (ELIQUIS) 5 MG TABS tablet Take 1 tablet (5 mg total) by mouth 2 (two) times daily. For stroke prevention   ezetimibe (ZETIA) 10 MG tablet Take 1 tablet (10 mg total) by mouth daily.   furosemide (LASIX) 40 MG tablet Take by mouth. Take '40mg'$  daily alternating with '80mg'$  every other day.   Glucagon (GVOKE HYPOPEN 2-PACK) 1 MG/0.2ML SOAJ Inject 1 mg subcutaneously once  as needed for low blood sugar. May repeat dose in 15 minutes as needed using a new device.   Insulin Pen Needle (B-D ULTRAFINE III SHORT PEN) 31G X 8 MM MISC USE 1 PEN NEEDLE THREE TIMES DAILY   insulin regular human CONCENTRATED (HUMULIN R U-500 KWIKPEN) 500 UNIT/ML KwikPen Inject 100 units under skin with breakfast and lunch , inject  75 units under skin with supper.   levothyroxine (SYNTHROID) 137 MCG tablet TAKE 1 TABLET BY MOUTH ONCE DAILY BEFORE BREAKFAST   loratadine (CLARITIN) 10 MG tablet Take 10 mg by mouth daily as needed for allergies.    losartan (COZAAR) 100 MG tablet Take 100 mg by mouth daily.   metoprolol succinate (TOPROL XL) 25 MG 24 hr tablet Take 1 tablet (25 mg total) by mouth daily as needed (for prolonged palpitations).   metoprolol succinate (TOPROL-XL) 100 MG 24 hr tablet Take 1 tablet (100 mg total) by mouth 2 (two) times daily.   modafinil (PROVIGIL) 200 MG tablet Take 1 tablet (200 mg total) by mouth daily.   MYRBETRIQ 50 MG TB24 tablet Take 50 mg by mouth daily.   nitroGLYCERIN (NITROSTAT) 0.4 MG SL tablet DISSOLVE 1 TABLET UNDER TONGUE EVERY 5 MINUTES UP TO 15 MIN FOR CHESTPAIN. IF NO RELIEF CALL 911.   ONE TOUCH ULTRA TEST test strip TEST BLOOD SUGAR UP TO 4 TIMES DAILY.   ONETOUCH DELICA LANCETS 31S MISC USE AS DIRECTED TO TEST BLOOD SUGAR 4 TIMES DAILY.   pantoprazole (PROTONIX) 40 MG tablet Take 40 mg by mouth daily.   pravastatin (PRAVACHOL) 40 MG tablet TAKE 1 TABLET BY MOUTH ONCE DAILY IN THE EVENING   rOPINIRole (REQUIP XL) 4 MG 24 hr tablet Take 1 tablet (4 mg total) by mouth at bedtime.   rOPINIRole (REQUIP) 4 MG tablet Take 0.5 tablets (2 mg total) by mouth 2 (two) times daily.   Semaglutide, 1 MG/DOSE, 4 MG/3ML SOPN Inject 1 mg as directed once a week.   TRELEGY ELLIPTA 100-62.5-25 MCG/ACT AEPB INHALE 1 PUFF INTO LUNGS ONCE DAILY   ondansetron (ZOFRAN-ODT) 4 MG disintegrating tablet Take 1 tablet (4 mg total) by mouth every 8 (eight) hours as needed for  nausea or vomiting.   No facility-administered encounter medications on file as of 12/12/2021.   ALLERGIES: Allergies  Allergen Reactions   Tape Rash    Pulls off skin   Farxiga [Dapagliflozin]     Yeast infections, felt sick with it   Cardizem [Diltiazem Hcl]     Edema    Cardura [Doxazosin Mesylate]     Headaches / cramps   Crestor [Rosuvastatin] Other (See Comments)    Leg cramps   Lipitor [Atorvastatin] Other (See Comments)    Leg cramps   Paxil [Paroxetine Hcl]     Unknown reaction    Codeine Rash and Other (See Comments)    Headache    Gabapentin Rash   VACCINATION STATUS: Immunization History  Administered Date(s) Administered   Fluad Quad(high Dose 65+) 11/03/2021   Influenza Split 09/18/2012   Influenza,inj,Quad PF,6+ Mos 10/13/2013, 10/06/2014, 09/11/2018   Influenza-Unspecified 12/01/2015, 11/08/2016, 11/02/2020   PFIZER(Purple Top)SARS-COV-2 Vaccination 03/06/2019, 03/27/2019   Pneumococcal Polysaccharide-23 10/02/2004, 10/03/2011   Td 05/24/2006    Diabetes He presents for his follow-up diabetic visit. He has type 2 diabetes mellitus. Onset time: Was diagnosed at approximate age of 55 years. His disease course has been fluctuating. There are no hypoglycemic associated symptoms. Pertinent negatives for hypoglycemia include no confusion, headaches, nervousness/anxiousness, pallor, seizures or tremors. Associated symptoms include fatigue and foot paresthesias. Pertinent negatives for diabetes include no chest pain, no polydipsia, no polyphagia, no polyuria, no weakness and no weight loss. There are no hypoglycemic complications. Symptoms are stable. Diabetic complications include a CVA, heart disease, nephropathy and peripheral neuropathy. (Has had 2 mini strokes ) Risk factors for coronary artery disease include diabetes mellitus, dyslipidemia,  male sex, obesity, sedentary lifestyle, tobacco exposure and hypertension. Current diabetic treatment includes intensive  insulin program (and Ozempic). He is compliant with treatment most of the time. His weight is increasing steadily. He is following a generally healthy diet. When asked about meal planning, he reported none. He has had a previous visit with a dietitian. He never participates in exercise. His home blood glucose trend is fluctuating dramatically. His overall blood glucose range is >200 mg/dl. (He presents today with his CGM showing dramatically fluctuating glycemic profile.  His POCT A1c today is 8.9%, improving from last visit of 9.5%.  He notes that he sometimes does not get any improvement with his insulin injections and other times it does improve.  Analysis of his CGM shows TIR 33%, TAR 67%, TBR 0% with a GMI of 9.1%.   ) An ACE inhibitor/angiotensin II receptor blocker is being taken. He sees a podiatrist.Eye exam is current.  Hyperlipidemia This is a chronic problem. The current episode started more than 1 year ago. The problem is uncontrolled. Recent lipid tests were reviewed and are variable. Exacerbating diseases include chronic renal disease, diabetes, hypothyroidism and obesity. Factors aggravating his hyperlipidemia include fatty foods. Pertinent negatives include no chest pain, myalgias or shortness of breath. Current antihyperlipidemic treatment includes statins. The current treatment provides mild improvement of lipids. Compliance problems include adherence to diet and adherence to exercise.  Risk factors for coronary artery disease include dyslipidemia, diabetes mellitus, hypertension, male sex, obesity and a sedentary lifestyle.  Hypertension This is a chronic problem. The current episode started more than 1 year ago. The problem has been resolved since onset. The problem is controlled. Pertinent negatives include no chest pain, headaches, neck pain, palpitations or shortness of breath. Agents associated with hypertension include thyroid hormones. Risk factors for coronary artery disease include  diabetes mellitus, dyslipidemia, male gender, obesity, sedentary lifestyle and smoking/tobacco exposure. Past treatments include angiotensin blockers, beta blockers and diuretics. Compliance problems include exercise and diet.  Hypertensive end-organ damage includes kidney disease, CAD/MI and CVA. Identifiable causes of hypertension include chronic renal disease and a thyroid problem.  Thyroid Problem Presents for follow-up visit. Symptoms include fatigue and leg swelling. Patient reports no anxiety, cold intolerance, constipation, depressed mood, diarrhea, heat intolerance, palpitations, tremors, weight gain or weight loss. The symptoms have been stable. Past treatments include levothyroxine. His past medical history is significant for diabetes and hyperlipidemia.     Review of systems  Constitutional: + steadily increasing,  current Body mass index is 37.84 kg/m. , no fatigue, no subjective hyperthermia, no subjective hypothermia, reports mild memory impairment since mini strokes Eyes: no blurry vision, no xerophthalmia ENT: no sore throat, no nodules palpated in throat, no dysphagia/odynophagia, no hoarseness Cardiovascular: no chest pain, no shortness of breath, no palpitations,  Respiratory: no cough, no shortness of breath Gastrointestinal: no nausea/vomiting/diarrhea Musculoskeletal: no muscle/joint aches Skin: no rashes, no hyperemia Neurological: no tremors, no numbness, no tingling, no dizziness, + intermittent numbness/tingling to bilateral feet Psychiatric: no depression, no anxiety    Objective:    BP (!) 142/72 (BP Location: Left Arm, Patient Position: Sitting, Cuff Size: Large)   Pulse 86   Ht '5\' 5"'$  (1.651 m)   Wt 227 lb 6.4 oz (103.1 kg)   BMI 37.84 kg/m   Wt Readings from Last 3 Encounters:  12/12/21 227 lb 6.4 oz (103.1 kg)  11/03/21 222 lb (100.7 kg)  11/02/21 219 lb 8 oz (99.6 kg)     BP Readings from Last  3 Encounters:  12/12/21 (!) 142/72  11/03/21 132/78   11/02/21 129/68     Physical Exam- Limited  Constitutional:  Body mass index is 37.84 kg/m. , not in acute distress, normal state of mind Eyes:  EOMI, no exophthalmos Neck: Supple Cardiovascular: Irregular (hx afib), no murmurs, rubs, or gallops, no edema Respiratory: Adequate breathing efforts, no crackles, rales, rhonchi, or wheezing Musculoskeletal: no gross deformities, strength intact in all four extremities, no gross restriction of joint movements Skin:  no rashes, no hyperemia Neurological: no tremor with outstretched hands   Diabetic Foot Exam - Simple   Simple Foot Form Diabetic Foot exam was performed with the following findings: Yes 12/12/2021  9:23 AM  Visual Inspection No deformities, no ulcerations, no other skin breakdown bilaterally: Yes Sensation Testing Intact to touch and monofilament testing bilaterally: Yes See comments: Yes Pulse Check Posterior Tibialis and Dorsalis pulse intact bilaterally: Yes Comments Decreased sensation to monofilament tool bilaterally        Latest Ref Rng & Units 12/06/2021    1:41 PM 10/17/2021    2:58 PM 09/19/2021    4:15 PM  CMP  Glucose 70 - 99 mg/dL 221  315  324   BUN 8 - 27 mg/dL 23  34  23   Creatinine 0.76 - 1.27 mg/dL 1.27  1.37  1.25   Sodium 134 - 144 mmol/L 138  139  138   Potassium 3.5 - 5.2 mmol/L 4.3  4.2  4.1   Chloride 96 - 106 mmol/L 97  98  98   CO2 20 - 29 mmol/L '25  23  22   '$ Calcium 8.6 - 10.2 mg/dL 9.9  9.6  10.3   Total Protein 6.0 - 8.5 g/dL  7.0  7.0   Total Bilirubin 0.0 - 1.2 mg/dL  <0.2  <0.2   Alkaline Phos 44 - 121 IU/L  80  75   AST 0 - 40 IU/L  16  13   ALT 0 - 44 IU/L  16  13      Diabetic Labs (most recent): Lab Results  Component Value Date   HGBA1C 8.9 (A) 12/12/2021   HGBA1C 9.5 (A) 09/09/2021   HGBA1C 10.6 08/23/2021   MICROALBUR 10 06/09/2021   MICROALBUR 9.5 02/06/2019   MICROALBUR 4.6 10/08/2017   Lipid Panel     Component Value Date/Time   CHOL 166 04/05/2021  0819   TRIG 226 (H) 04/05/2021 0819   HDL 55 04/05/2021 0819   CHOLHDL 3.0 04/05/2021 0819   CHOLHDL 3.2 08/05/2019 0334   VLDL 19 08/05/2019 0334   LDLCALC 74 04/05/2021 0819   LDLCALC 118 (H) 02/06/2019 0813     Assessment & Plan:   1) Uncontrolled type 2 diabetes mellitus with circulatory complication, with long-term current use of insulin (HCC)  -His diabetes is complicated by coronary artery disease and patient remains at an extremely high risk for more acute and chronic complications of diabetes which include CAD, CVA, CKD, retinopathy, and neuropathy. These are all discussed in detail with the patient.  He presents today with his CGM showing dramatically fluctuating glycemic profile.  His POCT A1c today is 8.9%, improving from last visit of 9.5%.  He notes that he sometimes does not get any improvement with his insulin injections and other times it does improve.  Analysis of his CGM shows TIR 33%, TAR 67%, TBR 0% with a GMI of 9.1%.       Glucose logs and insulin administration records pertaining  to this visit,  to be scanned into patient's records.  Recent labs reviewed.  - Nutritional counseling repeated at each appointment due to patients tendency to fall back in to old habits.  - The patient admits there is a room for improvement in their diet and drink choices. -  Suggestion is made for the patient to avoid simple carbohydrates from their diet including Cakes, Sweet Desserts / Pastries, Ice Cream, Soda (diet and regular), Sweet Tea, Candies, Chips, Cookies, Sweet Pastries, Store Bought Juices, Alcohol in Excess of 1-2 drinks a day, Artificial Sweeteners, Coffee Creamer, and "Sugar-free" Products. This will help patient to have stable blood glucose profile and potentially avoid unintended weight gain.   - I encouraged the patient to switch to unprocessed or minimally processed complex starch and increased protein intake (animal or plant source), fruits, and vegetables.   -  Patient is advised to stick to a routine mealtimes to eat 3 meals a day and avoid unnecessary snacks (to snack only to correct hypoglycemia).  - I have approached patient with the following individualized plan to manage diabetes and patient agrees.  -He will continue to need intensive treatment with higher dose of insulin.  He has been better with U500 versus [place] and regular insulin.     -He is advised to continue Humulin R U500 to 100 units with breakfast and lunch and 75 units with supper if glucose is above 90 and he is eating.  Will increase his Ozempic to 2 mg SQ weekly (can take 2 of his 1 mg doses until he depletes his current supply).   New PAP form will be sent given increase in dose.  -We did discuss potentially adding the Omnipod DASH to help manage his glucose given his variations.    -He is encouraged to continue using his CGM to monitor glucose 4 times daily, before meals and before bed, and to call the clinic if he has readings less than 70 or greater than 300 for 3 tests in a row.  2) BP/HTN:  His blood pressure is controlled to target for his age.  He is advised to continue Lasix 40 mg po daily, Losartan 50 mg po daily, Norvasc 5 mg po daily, and Metoprolol 75 mg po twice daily.  3) Lipids/HPL:  His most recent lipid panel from 04/05/21 shows controlled LDL of 74 (improving) and elevated triglycerides of 226.  He is advised to continue Pravastatin 40 mg po daily at bedtime.  Side effects and precautions discussed with him.    4)  Weight/Diet:  His Body mass index is 16.10 kg/m.-complicating his diabetes care.  He is a candidate for modest weight loss.  CDE consult in progress, exercise, and carbohydrates information provided.  5) Hypothyroidism -His previsit TFTs show appropriate hormone replacement.  He is advised to continue current dose of Levothyroxine at 137 mcg po daily before breakfast.     - We discussed about the correct intake of his thyroid hormone, on empty  stomach at fasting, with water, separated by at least 30 minutes from breakfast and other medications,  and separated by more than 4 hours from calcium, iron, multivitamins, acid reflux medications (PPIs). -Patient is made aware of the fact that thyroid hormone replacement is needed for life, dose to be adjusted by periodic monitoring of thyroid function tests.  6) Chronic Care/Health Maintenance: -Patient is on ACEI/ARB and Statin medications and encouraged to continue to follow up with Ophthalmology, Podiatrist at least yearly or according to recommendations, and  advised to  stay away from smoking. I have recommended yearly flu vaccine and pneumonia vaccination at least every 5 years; moderate intensity exercise for up to 150 minutes weekly; and  sleep for at least 7 hours a day.  - I advised patient to maintain close follow up with Kathyrn Drown, MD for primary care needs.    I spent 41 minutes in the care of the patient today including review of labs from [place], Lipids, Thyroid Function, Hematology (current and previous including abstractions from other facilities); face-to-face time discussing  his blood glucose readings/logs, discussing hypoglycemia and hyperglycemia episodes and symptoms, medications doses, his options of short and long term treatment based on the latest standards of care / guidelines;  discussion about incorporating lifestyle medicine;  and documenting the encounter. Risk reduction counseling performed per USPSTF guidelines to reduce obesity and cardiovascular risk factors.     Please refer to Patient Instructions for Blood Glucose Monitoring and Insulin/Medications Dosing Guide"  in media tab for additional information. Please  also refer to " Patient Self Inventory" in the Media  tab for reviewed elements of pertinent patient history.  Paul Baker participated in the discussions, expressed understanding, and voiced agreement with the above plans.  All questions were  answered to his satisfaction. he is encouraged to contact clinic should he have any questions or concerns prior to his return visit.   Follow up plan: -Return in about 3 months (around 03/13/2022) for Diabetes F/U with A1c in office, No previsit labs, Bring meter and logs.  Rayetta Pigg, [facility] [address] [place], [place] [address] Phone: [phone] Fax: [phone]  12/12/2021, 10:43 AM

## 2021-12-15 DIAGNOSIS — E1110 Type 2 diabetes mellitus with ketoacidosis without coma: Secondary | ICD-10-CM | POA: Diagnosis not present

## 2021-12-19 DIAGNOSIS — E119 Type 2 diabetes mellitus without complications: Secondary | ICD-10-CM | POA: Diagnosis not present

## 2021-12-19 LAB — HM DIABETES EYE EXAM

## 2021-12-27 ENCOUNTER — Other Ambulatory Visit: Payer: Self-pay | Admitting: Family Medicine

## 2021-12-27 ENCOUNTER — Other Ambulatory Visit: Payer: Self-pay | Admitting: Pulmonary Disease

## 2021-12-27 ENCOUNTER — Other Ambulatory Visit: Payer: Self-pay | Admitting: Nurse Practitioner

## 2021-12-27 ENCOUNTER — Other Ambulatory Visit: Payer: Self-pay | Admitting: Cardiology

## 2021-12-27 DIAGNOSIS — E782 Mixed hyperlipidemia: Secondary | ICD-10-CM

## 2021-12-28 NOTE — Telephone Encounter (Signed)
So this medication is not generally recommended in older individuals because increased risk of drowsiness and falls.  Not sure why patient is asking for refill to help patient is currently using this.  Patient needs to be aware that this medicine is really not something we like using at his age

## 2021-12-29 DIAGNOSIS — G4733 Obstructive sleep apnea (adult) (pediatric): Secondary | ICD-10-CM | POA: Diagnosis not present

## 2022-01-14 DIAGNOSIS — E1110 Type 2 diabetes mellitus with ketoacidosis without coma: Secondary | ICD-10-CM | POA: Diagnosis not present

## 2022-01-15 ENCOUNTER — Other Ambulatory Visit: Payer: Self-pay | Admitting: Family Medicine

## 2022-01-15 ENCOUNTER — Other Ambulatory Visit: Payer: Self-pay | Admitting: Pulmonary Disease

## 2022-01-16 ENCOUNTER — Other Ambulatory Visit: Payer: Self-pay

## 2022-01-16 MED ORDER — TRELEGY ELLIPTA 100-62.5-25 MCG/ACT IN AEPB: 1.0000 | INHALATION_SPRAY | Freq: Every day | RESPIRATORY_TRACT | 2 refills | Status: DC

## 2022-01-16 MED ORDER — MYRBETRIQ 50 MG PO TB24
50.0000 mg | ORAL_TABLET | Freq: Every day | ORAL | 6 refills | Status: DC
  Filled 2022-01-16: qty 30, 30d supply, fill #0

## 2022-01-19 ENCOUNTER — Other Ambulatory Visit: Payer: Self-pay | Admitting: Nurse Practitioner

## 2022-01-19 DIAGNOSIS — E1159 Type 2 diabetes mellitus with other circulatory complications: Secondary | ICD-10-CM

## 2022-01-23 ENCOUNTER — Encounter: Payer: Self-pay | Admitting: Family Medicine

## 2022-01-23 ENCOUNTER — Ambulatory Visit (INDEPENDENT_AMBULATORY_CARE_PROVIDER_SITE_OTHER): Payer: Medicare HMO | Admitting: Family Medicine

## 2022-01-23 VITALS — BP 137/67 | HR 74 | Temp 97.6°F | Wt 224.6 lb

## 2022-01-23 DIAGNOSIS — J209 Acute bronchitis, unspecified: Secondary | ICD-10-CM | POA: Diagnosis not present

## 2022-01-23 MED ORDER — AMOXICILLIN-POT CLAVULANATE 875-125 MG PO TABS: 1.0000 | ORAL_TABLET | Freq: Two times a day (BID) | ORAL | 0 refills | Status: DC

## 2022-01-23 MED ORDER — PROMETHAZINE-DM 6.25-15 MG/5ML PO SYRP: 5.0000 mL | ORAL_SOLUTION | Freq: Four times a day (QID) | ORAL | 0 refills | Status: DC | PRN

## 2022-01-23 NOTE — Assessment & Plan Note (Signed)
Given comorbidities, patient treated empirically with Augmentin.  Promethazine DM for cough.  No wheezing on exam.  Given uncontrolled diabetes and the fact that there is no wheezing, I did not proceed with corticosteroids.

## 2022-01-23 NOTE — Patient Instructions (Signed)
Antibiotic as prescribed.  Cough medicine as needed.  If you fail to improve or worsen, please let us know.  Take care  Dr. Lacinda Axon

## 2022-01-23 NOTE — Progress Notes (Signed)
Subjective:  Patient ID: Paul Baker, male    DOB: [birth year]  Age: 72 y.o. MRN: [id number]  CC: Chief Complaint  Patient presents with   Cough    Pt arrives due to bad cough, feels like it is in his chest, some trouble breathing/shortness of breath. Going on about one week but is worsening.     HPI:  72 year old male with an extensive past medical history presents for evaluation of the above.  1 week history of cough.  Reports some discomfort in the chest secondary to cough.  Associated shortness of breath.  He has some shortness of breath at baseline.  Minimal upper respiratory symptoms.  No relieving factors.  No fever.  Patient endorses compliance with his inhalers.  Patient Active Problem List   Diagnosis Date Noted   Acute bronchitis 01/23/2022   RUQ pain 10/18/2021   Grade I diastolic dysfunction 54/36/0677   Paroxysmal atrial fibrillation (HCC)    Hypothyroidism    Asthma 08/05/2019   Class 2 obesity 08/05/2019   Mixed hyperlipidemia 11/25/2014   S/P right TKA 03/23/2014   S/P knee replacement 03/23/2014   Spinal stenosis of cervicothoracic region 10/08/2013   RLS (restless legs syndrome) 10/08/2013   DM type 2 causing vascular disease ([place]) 10/08/2013   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/02/2013   Diabetic neuropathy ([place]) 07/23/2012   Obesity, morbid ([place]) 05/01/2012   OSA on CPAP 04/19/2012   Essential hypertension, benign 12/16/2009   CAD (coronary artery disease) 12/16/2009   Hyperglycemia due to type 2 diabetes mellitus ([place]) 11/30/2009    Social Hx   Social History   Socioeconomic History   Marital status: Married    Spouse name: Information systems manager   Number of children: 2   Years of education: college   Highest education level: Not on file  Occupational History   Occupation: Disabled    Employer: UNEMPLOYED  Tobacco Use   Smoking status: Former    Packs/day: 1.50    Years: 10.00    Total pack years: 15.00    Types: Cigarettes     Start date: 06/19/1967    Quit date: 01/03/1995    Years since quitting: 27.0   Smokeless tobacco: Never  Vaping Use   Vaping Use: Never used  Substance and Sexual Activity   Alcohol use: No    Alcohol/week: 0.0 standard drinks of alcohol    Comment: quit drinking in 07/86   Drug use: No   Sexual activity: Yes    Partners: Female  Other Topics Concern   Not on file  Social History Narrative    72 year old, right-handed, caucasian male with a past medical history of obesity, hypertension, hyperlipidemia, diabetes, obstructive sleep apnea, presenting with frequent nighttime awakenings, excessive daytime sleepiness, also transient confusional episodes.RLS and one beosity, OSA on CPAP with AHI of 3.2 and  setting of 16 cm water , [facility] .   Married since 1977.   Social Determinants of Health   Financial Resource Strain: Low Risk  (01/18/2021)   Overall Financial Resource Strain (CARDIA)    Difficulty of Paying Living Expenses: Not hard at all  Food Insecurity: No Food Insecurity (01/18/2021)   Hunger Vital Sign    Worried About Running Out of Food in the Last Year: Never true    Ran Out of Food in the Last Year: Never true  Transportation Needs: No Transportation Needs (01/18/2021)   PRAPARE - Hydrologist (Medical): No  Lack of Transportation (Non-Medical): No  Physical Activity: Sufficiently Active (01/18/2021)   Exercise Vital Sign    Days of Exercise per Week: 3 days    Minutes of Exercise per Session: 60 min  Stress: No Stress Concern Present (01/18/2021)   [place]    Feeling of Stress : Only a little  Social Connections: Socially Integrated (01/18/2021)   Social Connection and Isolation Panel [NHANES]    Frequency of Communication with Friends and Family: More than three times a week    Frequency of Social Gatherings with Friends and Family: More than three times a week     Attends Religious Services: More than 4 times per year    Active Member of [employer] or Organizations: Yes    Attends Music therapist: More than 4 times per year    Marital Status: Married    Review of Systems Per HPI  Objective:  BP 137/67   Pulse 74   Temp 97.6 F (36.4 C)   Wt 224 lb 9.6 oz (101.9 kg)   SpO2 98%   BMI 37.38 kg/m      01/23/2022    3:50 PM 01/23/2022    3:34 PM 12/12/2021    8:55 AM  BP/Weight  Systolic BP 277 824 235  Diastolic BP 67 73 72  Wt. (Lbs)  224.6 227.4  BMI  37.38 kg/m2 37.84 kg/m2    Physical Exam Constitutional:      Appearance: Normal appearance. He is obese.  HENT:     Head: Normocephalic and atraumatic.  Eyes:     General:        Right eye: No discharge.        Left eye: No discharge.     Conjunctiva/sclera: Conjunctivae normal.  Cardiovascular:     Rate and Rhythm: Normal rate and regular rhythm.  Pulmonary:     Effort: Pulmonary effort is normal.     Breath sounds: Normal breath sounds. No wheezing, rhonchi or rales.  Neurological:     Mental Status: He is alert.     Lab Results  Component Value Date   WBC 7.9 10/17/2021   HGB 11.9 (L) 10/17/2021   HCT 35.7 (L) 10/17/2021   PLT 202 10/17/2021   GLUCOSE 221 (H) 12/06/2021   CHOL 166 04/05/2021   TRIG 226 (H) 04/05/2021   HDL 55 04/05/2021   LDLCALC 74 04/05/2021   ALT 16 10/17/2021   AST 16 10/17/2021   NA 138 12/06/2021   K 4.3 12/06/2021   CL 97 12/06/2021   CREATININE 1.27 12/06/2021   BUN 23 12/06/2021   CO2 25 12/06/2021   TSH 1.230 12/06/2021   PSA 2.03 11/13/2013   INR 0.9 08/05/2019   HGBA1C 8.9 (A) 12/12/2021   MICROALBUR 10 06/09/2021     Assessment & Plan:   Problem List Items Addressed This Visit       Respiratory   Acute bronchitis - Primary    Given comorbidities, patient treated empirically with Augmentin.  Promethazine DM for cough.  No wheezing on exam.  Given uncontrolled diabetes and the fact that there is no wheezing,  I did not proceed with corticosteroids.       Meds ordered this encounter  Medications   amoxicillin-clavulanate (AUGMENTIN) 875-125 MG tablet    Sig: Take 1 tablet by mouth 2 (two) times daily.    Dispense:  14 tablet    Refill:  0   promethazine-dextromethorphan (PROMETHAZINE-DM)  6.25-15 MG/5ML syrup    Sig: Take 5 mLs by mouth 4 (four) times daily as needed for cough.    Dispense:  118 mL    Refill:  0    Follow-up:  Return if symptoms worsen or fail to improve.  [place]

## 2022-01-24 ENCOUNTER — Ambulatory Visit: Payer: Medicare HMO | Admitting: Family Medicine

## 2022-01-24 ENCOUNTER — Encounter (HOSPITAL_COMMUNITY): Payer: Self-pay | Admitting: Internal Medicine

## 2022-01-24 DIAGNOSIS — R42 Dizziness and giddiness: Secondary | ICD-10-CM | POA: Diagnosis not present

## 2022-01-24 DIAGNOSIS — R609 Edema, unspecified: Secondary | ICD-10-CM | POA: Diagnosis not present

## 2022-01-24 DIAGNOSIS — I129 Hypertensive chronic kidney disease with stage 1 through stage 4 chronic kidney disease, or unspecified chronic kidney disease: Secondary | ICD-10-CM | POA: Diagnosis not present

## 2022-01-24 DIAGNOSIS — E1122 Type 2 diabetes mellitus with diabetic chronic kidney disease: Secondary | ICD-10-CM | POA: Diagnosis not present

## 2022-01-24 DIAGNOSIS — I4891 Unspecified atrial fibrillation: Secondary | ICD-10-CM | POA: Diagnosis not present

## 2022-01-24 DIAGNOSIS — Z6836 Body mass index (BMI) 36.0-36.9, adult: Secondary | ICD-10-CM | POA: Diagnosis not present

## 2022-01-24 DIAGNOSIS — N182 Chronic kidney disease, stage 2 (mild): Secondary | ICD-10-CM | POA: Diagnosis not present

## 2022-01-24 NOTE — Progress Notes (Signed)
Attempted to obtain medical history via telephone, unable to reach at this time. HIPAA compliant voicemail message left requesting return call to pre surgical testing department.  

## 2022-01-26 NOTE — Progress Notes (Deleted)
Subjective:   Paul Baker is a 72 y.o. male who presents for Medicare Annual/Subsequent preventive examination.  Review of Systems    ***       Objective:    There were no vitals filed for this visit. There is no height or weight on file to calculate BMI.     06/03/2021    7:12 PM 03/11/2021    3:02 PM 01/18/2021   11:01 AM 12/18/2020    1:12 PM 02/18/2020    4:54 PM 12/05/2019    5:42 AM 12/05/2019   12:57 AM  Advanced Directives  Does Patient Have a Medical Advance Directive? No No Yes Yes No Yes No  Type of Scientist, physiological of [place];Living will Living will  [place] in Chart?   No - copy requested   No - copy requested   Would patient like information on creating a medical advance directive? No - Patient declined No - Patient declined  No - Patient declined       Current Medications (verified) Outpatient Encounter Medications as of 01/27/2022  Medication Sig   acetaminophen (TYLENOL) 325 MG tablet Take 2 tablets (650 mg total) by mouth every 6 (six) hours as needed for mild pain (or Fever >/= 101).   albuterol (PROVENTIL) (2.5 MG/3ML) 0.083% nebulizer solution Take 3 mLs (2.5 mg total) by nebulization every 6 (six) hours as needed for up to 7 days for wheezing or shortness of breath.   albuterol (VENTOLIN HFA) 108 (90 Base) MCG/ACT inhaler Inhale 2 puffs into the lungs every 6 (six) hours as needed for wheezing or shortness of breath.   alfuzosin (UROXATRAL) 10 MG 24 hr tablet Take 10 mg by mouth daily.   amoxicillin-clavulanate (AUGMENTIN) 875-125 MG tablet Take 1 tablet by mouth 2 (two) times daily.   apixaban (ELIQUIS) 5 MG TABS tablet Take 1 tablet (5 mg total) by mouth 2 (two) times daily. For stroke prevention   ezetimibe (ZETIA) 10 MG tablet Take 1 tablet by mouth once daily   Fluticasone-Umeclidin-Vilant (TRELEGY ELLIPTA) 100-62.5-25 MCG/ACT AEPB Take 1 puff by mouth daily.    furosemide (LASIX) 40 MG tablet Take by mouth. Take '40mg'$  daily alternating with '80mg'$  every other day.   Glucagon (GVOKE HYPOPEN 2-PACK) 1 MG/0.2ML SOAJ Inject 1 mg subcutaneously once as needed for low blood sugar. May repeat dose in 15 minutes as needed using a new device.   Insulin Pen Needle (B-D ULTRAFINE III SHORT PEN) 31G X 8 MM MISC USE 1 PEN NEEDLE THREE TIMES DAILY   insulin regular human CONCENTRATED (HUMULIN R U-500 KWIKPEN) 500 UNIT/ML KwikPen Inject 100 units under skin with breakfast and lunch , inject 75 units under skin with supper.   levothyroxine (SYNTHROID) 137 MCG tablet TAKE 1 TABLET BY MOUTH ONCE DAILY BEFORE BREAKFAST   loratadine (CLARITIN) 10 MG tablet Take 10 mg by mouth daily as needed for allergies.    losartan (COZAAR) 100 MG tablet Take 100 mg by mouth daily.   metoprolol succinate (TOPROL XL) 25 MG 24 hr tablet Take 1 tablet (25 mg total) by mouth daily as needed (for prolonged palpitations).   metoprolol succinate (TOPROL-XL) 100 MG 24 hr tablet Take 1 tablet (100 mg total) by mouth 2 (two) times daily.   modafinil (PROVIGIL) 200 MG tablet Take 1 tablet (200 mg total) by mouth daily.   MYRBETRIQ 50 MG TB24 tablet Take 1 tablet (50 mg  total) by mouth daily.   nitroGLYCERIN (NITROSTAT) 0.4 MG SL tablet DISSOLVE 1 TABLET UNDER TONGUE EVERY 5 MINUTES UP TO 15 MIN FOR CHESTPAIN. IF NO RELIEF CALL 911.   ONE TOUCH ULTRA TEST test strip TEST BLOOD SUGAR UP TO 4 TIMES DAILY.   ONETOUCH DELICA LANCETS 99991111 MISC USE AS DIRECTED TO TEST BLOOD SUGAR 4 TIMES DAILY.   pantoprazole (PROTONIX) 40 MG tablet Take 40 mg by mouth daily.   pravastatin (PRAVACHOL) 40 MG tablet TAKE 1 TABLET BY MOUTH ONCE DAILY IN THE EVENING   promethazine-dextromethorphan (PROMETHAZINE-DM) 6.25-15 MG/5ML syrup Take 5 mLs by mouth 4 (four) times daily as needed for cough.   rOPINIRole (REQUIP XL) 4 MG 24 hr tablet Take 1 tablet (4 mg total) by mouth at bedtime.   rOPINIRole (REQUIP) 4 MG tablet Take 0.5  tablets (2 mg total) by mouth 2 (two) times daily.   Semaglutide, 1 MG/DOSE, 4 MG/3ML SOPN Inject 1 mg as directed once a week.   No facility-administered encounter medications on file as of 01/27/2022.    Allergies (verified) Tape, Farxiga [dapagliflozin], Cardizem [diltiazem hcl], Cardura [doxazosin mesylate], Crestor [rosuvastatin], Lipitor [atorvastatin], Paxil [paroxetine hcl], Codeine, and Gabapentin   History: Past Medical History:  Diagnosis Date   Arthritis    Asthma    Atrial fibrillation [facility])    Diagnosed December 2021   Colon polyp    Coronary atherosclerosis of native coronary artery    a. 2011: cath showing 90% stenosis along small non-dominant RCA (too small for PCI). b. 01/2018: cath showing nonobstructive CAD with 60 to 70% proximal to mid nondominant RCA stenosis, 50% mid LAD and 40 to 50% OM1   DVT (deep venous thrombosis) ([place]) 2005   Right arm   Essential hypertension    Headache    History of transfusion    Hypothyroidism    Mixed hyperlipidemia    Morbid obesity ([place])    MRSA (methicillin resistant staph aureus) culture positive    08/2012   Narcolepsy    OSA (obstructive sleep apnea)    CPAP   Osteoarthritis    Pneumonia    Psoriasis    Pulmonary embolism ([place]) 2004   RLS (restless legs syndrome)    Rotator cuff disorder    Left   Septic arthritis of knee, left (HCC)    Skin cancer, basal cell    Type 2 diabetes mellitus ([place])    Past Surgical History:  Procedure Laterality Date   BACK SURGERY     CATARACT EXTRACTION Bilateral    COLONOSCOPY     CYST REMOVAL TRUNK     from back   EYE SURGERY Left 2016   laser to left eye   HIP SURGERY     bone removed from both sides of hip   KNEE ARTHROTOMY Right 12/04/2014   Procedure: KNEE ARTHROTOMY PATELLA LIGAMENT RECONSTRUSION AND REPAIR RIGHT KNEE;  Surgeon: Paralee Cancel, MD;  Location: [facility];  Service: Orthopedics;  Laterality: Right;   KNEE SURGERY     X 25 TIMES   LEFT HEART CATH AND CORONARY  ANGIOGRAPHY N/A 01/17/2018   Procedure: LEFT HEART CATH AND CORONARY ANGIOGRAPHY;  Surgeon: Belva Crome, MD;  Location: [facility];  Service: Cardiovascular;  Laterality: N/A;   LUMBAR DISC SURGERY     Left L3, L4, L5 discecotomy with decompression of L4 root   TONSILLECTOMY     TOTAL KNEE ARTHROPLASTY  2003   LEFT   TOTAL KNEE ARTHROPLASTY Right  03/23/2014   Procedure: RIGHT TOTAL KNEE ARTHROPLASTY AND REMOVAL RIGHT TIBIAL  DEEP IMPLANT STAPLE;  Surgeon: Paralee Cancel, MD;  Location: [facility];  Service: Orthopedics;  Laterality: Right;   TOTAL KNEE REVISION  2005   LEFT   WRIST SURGERY     Family History  Problem Relation Age of Onset   Hypertension Father    Heart attack Father    Kidney Stones Father    Seizures Grandchild    Narcolepsy Grandchild    Diabetes Sister    Social History   Socioeconomic History   Marital status: Married    Spouse name: Information systems manager   Number of children: 2   Years of education: college   Highest education level: Not on file  Occupational History   Occupation: Disabled    Employer: UNEMPLOYED  Tobacco Use   Smoking status: Former    Packs/day: 1.50    Years: 10.00    Total pack years: 15.00    Types: Cigarettes    Start date: 06/19/1967    Quit date: 01/03/1995    Years since quitting: 27.0   Smokeless tobacco: Never  Vaping Use   Vaping Use: Never used  Substance and Sexual Activity   Alcohol use: No    Alcohol/week: 0.0 standard drinks of alcohol    Comment: quit drinking in 07/86   Drug use: No   Sexual activity: Yes    Partners: Female  Other Topics Concern   Not on file  Social History Narrative    72 year old, right-handed, caucasian male with a past medical history of obesity, hypertension, hyperlipidemia, diabetes, obstructive sleep apnea, presenting with frequent nighttime awakenings, excessive daytime sleepiness, also transient confusional episodes.RLS and one beosity, OSA on CPAP with AHI of 3.2 and  setting of 16 cm water ,  [facility] .   Married since 1977.   Social Determinants of Health   Financial Resource Strain: Low Risk  (01/18/2021)   Overall Financial Resource Strain (CARDIA)    Difficulty of Paying Living Expenses: Not hard at all  Food Insecurity: No Food Insecurity (01/18/2021)   Hunger Vital Sign    Worried About Running Out of Food in the Last Year: Never true    Ran Out of Food in the Last Year: Never true  Transportation Needs: No Transportation Needs (01/18/2021)   PRAPARE - Hydrologist (Medical): No    Lack of Transportation (Non-Medical): No  Physical Activity: Sufficiently Active (01/18/2021)   Exercise Vital Sign    Days of Exercise per Week: 3 days    Minutes of Exercise per Session: 60 min  Stress: No Stress Concern Present (01/18/2021)   [place]    Feeling of Stress : Only a little  Social Connections: Socially Integrated (01/18/2021)   Social Connection and Isolation Panel [NHANES]    Frequency of Communication with Friends and Family: More than three times a week    Frequency of Social Gatherings with Friends and Family: More than three times a week    Attends Religious Services: More than 4 times per year    Active Member of [employer] or Organizations: Yes    Attends Music therapist: More than 4 times per year    Marital Status: Married    Tobacco Counseling Counseling given: Not Answered   Clinical Intake:                 Diabetic?Yes  Nutrition Risk  Assessment:  Has the patient had any N/V/D within the last 2 months?  {YES/NO:21197} Does the patient have any non-healing wounds?  {YES/NO:21197} Has the patient had any unintentional weight loss or weight gain?  {YES/NO:21197}  Diabetes:  Is the patient diabetic?  {YES/NO:21197} If diabetic, was a CBG obtained today?  {YES/NO:21197} Did the patient bring in their glucometer from home?   {YES/NO:21197} How often do you monitor your CBG's? ***.   Financial Strains and Diabetes Management:  Are you having any financial strains with the device, your supplies or your medication? {YES/NO:21197}.  Does the patient want to be seen by Chronic Care Management for management of their diabetes?  {YES/NO:21197} Would the patient like to be referred to a Nutritionist or for Diabetic Management?  {YES/NO:21197}  Diabetic Exams:  {Diabetic Eye Exam:2101801} {Diabetic Foot Exam:2101802}          Activities of Daily Living     No data to display          Patient Care Team: Kathyrn Drown, MD as PCP - General (Family Medicine) Satira Sark, MD as PCP - Cardiology (Cardiology) Mcarthur Rossetti, MD (Orthopedic Surgery) Beryle Lathe, [facility] (Inactive) (Pharmacist)  Indicate any recent Medical Services you may have received from other than [facility] providers in the past year (date may be approximate).     Assessment:   This is a routine wellness examination for Paul Baker.  Hearing/Vision screen No results found.  Dietary issues and exercise activities discussed:     Goals Addressed   None    Depression Screen    11/03/2021    4:56 PM 10/17/2021    1:34 PM 01/18/2021   10:41 AM 11/18/2019    2:33 PM 01/14/2019   11:35 AM 12/05/2017    8:16 AM 10/11/2017   10:45 AM  PHQ 2/9 Scores  PHQ - 2 Score 0 0 0 0 0 0 0  PHQ- 9 Score 4        Exception Documentation     Medical reason      Fall Risk    11/03/2021    4:56 PM 10/17/2021    1:33 PM 01/18/2021   10:50 AM 01/17/2021    3:59 PM 11/18/2019    2:33 PM  [place] in the past year? '1 1 1 1 '$ 0  Number falls in past yr: 1 1 0 0   Injury with Fall? 0 0 0 0   Risk for fall due to : No Fall Risks Impaired balance/gait Impaired balance/gait;History of fall(s)    Follow up Falls evaluation completed Falls evaluation completed;Education provided;Falls prevention discussed Falls prevention  discussed  Falls evaluation completed    FALL RISK PREVENTION PERTAINING TO THE HOME:  Any stairs in or around the home? {YES/NO:21197} If so, are there any without handrails? {YES/NO:21197} Home free of loose throw rugs in walkways, pet beds, electrical cords, etc? {YES/NO:21197} Adequate lighting in your home to reduce risk of falls? {YES/NO:21197}  ASSISTIVE DEVICES UTILIZED TO PREVENT FALLS:  Life alert? {YES/NO:21197} Use of a cane, walker or w/c? {YES/NO:21197} Grab bars in the bathroom? {YES/NO:21197} Shower chair or bench in shower? {YES/NO:21197} Elevated toilet seat or a handicapped toilet? {YES/NO:21197}  TIMED UP AND GO:  Was the test performed? {YES/NO:21197}.  Length of time to ambulate 10 feet: *** sec.   {Appearance of YN:9739091  Cognitive Function:        01/18/2021   10:53 AM  6CIT Screen  What  Year? 0 points  What month? 0 points  What time? 0 points  Count back from 20 0 points  Months in reverse 0 points  Repeat phrase 2 points  Total Score 2 points    Immunizations Immunization History  Administered Date(s) Administered   Fluad Quad(high Dose 65+) 11/03/2021   Influenza Split 09/18/2012   Influenza,inj,Quad PF,6+ Mos 10/13/2013, 10/06/2014, 09/11/2018   Influenza-Unspecified 12/01/2015, 11/08/2016, 11/02/2020   PFIZER(Purple Top)SARS-COV-2 Vaccination 03/06/2019, 03/27/2019   Pneumococcal Polysaccharide-23 10/02/2004, 10/03/2011   Td 05/24/2006    {TDAP status:2101805}  Flu Vaccine status: Up to date  {Pneumococcal vaccine status:2101807}  Covid-19 vaccine status: Information provided on how to obtain vaccines.   Qualifies for Shingles Vaccine? Yes   Zostavax completed No   Shingrix Completed?: No.    Education has been provided regarding the importance of this vaccine. Patient has been advised to call insurance company to determine out of pocket expense if they have not yet received this vaccine. Advised may also receive vaccine  at local pharmacy or Health Dept. Verbalized acceptance and understanding.  Screening Tests Health Maintenance  Topic Date Due   Hepatitis C Screening  Never done   COLONOSCOPY (Pts 45-64yr Insurance coverage will need to be confirmed)  Never done   Zoster Vaccines- Shingrix (1 of 2) Never done   DTaP/Tdap/Td (2 - Tdap) 05/23/2016   Pneumonia Vaccine 71 Years old (2 - PCV) 06/18/2016   COVID-19 Vaccine (3 - 2023-24 season) 09/02/2021   Medicare Annual Wellness (AWV)  01/18/2022   OPHTHALMOLOGY EXAM  12/30/2021   HEMOGLOBIN A1C  06/13/2022   Diabetic kidney evaluation - eGFR measurement  12/07/2022   Diabetic kidney evaluation - Urine ACR  12/07/2022   FOOT EXAM  12/13/2022   INFLUENZA VACCINE  Completed   HPV VACCINES  Aged Out    Health Maintenance  Health Maintenance Due  Topic Date Due   Hepatitis C Screening  Never done   COLONOSCOPY (Pts 45-439yrInsurance coverage will need to be confirmed)  Never done   Zoster Vaccines- Shingrix (1 of 2) Never done   DTaP/Tdap/Td (2 - Tdap) 05/23/2016   Pneumonia Vaccine 6539Years old (2 - PCV) 06/18/2016   COVID-19 Vaccine (3 - 2023-24 season) 09/02/2021   Medicare Annual Wellness (AWV)  01/18/2022   OPHTHALMOLOGY EXAM  12/30/2021    Colorectal cancer screening: Referral to GI placed and scheduled for 1/30. Pt aware the office will call re: appt.  Lung Cancer Screening: (Low Dose CT Chest recommended if Age 71-80ears, 30 pack-year currently smoking OR have quit w/in 15years.) {DOES NOT does:27190::"does not"} qualify.   Lung Cancer Screening Referral: ***  Additional Screening:  Hepatitis C Screening: {DOES NOT does:27190::"does not"} qualify; Completed ***  Vision Screening: Recommended annual ophthalmology exams for early detection of glaucoma and other disorders of the eye. Is the patient up to date with their annual eye exam?  {YES/NO:21197} Who is the provider or what is the name of the office in which the patient  attends annual eye exams? *** If pt is not established with a provider, would they like to be referred to a provider to establish care? {YES/NO:21197}.   Dental Screening: Recommended annual dental exams for proper oral hygiene  Community Resource Referral / Chronic Care Management: CRR required this visit?  {YES/NO:21197}  CCM required this visit?  {YES/NO:21197}     Plan:     I have personally reviewed and noted the following in the patient's chart:   Medical  and social history Use of alcohol, tobacco or illicit drugs  Current medications and supplements including opioid prescriptions. {Opioid [phone]} Functional ability and status Nutritional status Physical activity Advanced directives List of other physicians Hospitalizations, surgeries, and ER visits in previous 12 months Vitals Screenings to include cognitive, depression, and falls Referrals and appointments  In addition, I have reviewed and discussed with patient certain preventive protocols, quality metrics, and best practice recommendations. A written personalized care plan for preventive services as well as general preventive health recommendations were provided to patient.     Denman George [place], [place]   [id number]   Nurse Notes: ***

## 2022-01-26 NOTE — Patient Instructions (Incomplete)
Paul Baker , Thank you for taking time to come for your Medicare Wellness Visit. I appreciate your ongoing commitment to your health goals. Please review the following plan we discussed and let me know if I can assist you in the future.   These are the goals we discussed:  Goals      Exercise 3x per week (30 min per time)     Continue to keep up exercise. Travel.     Medication Management     Patient Goals/Self-Care Activities Patient will:  Take medications as prescribed Check blood sugar continuously with continuous glucose monitor, document, and provide at future appointments Check blood pressure at least once daily, document, and provide at future appointments Collaborate with provider on medication access solutions Engage in dietary modifications by fewer sweetened foods & beverages           This is a list of the screening recommended for you and due dates:  Health Maintenance  Topic Date Due   Hepatitis C Screening: USPSTF Recommendation to screen - Ages 22-79 yo.  Never done   Colon Cancer Screening  Never done   Zoster (Shingles) Vaccine (1 of 2) Never done   DTaP/Tdap/Td vaccine (2 - Tdap) 05/23/2016   Pneumonia Vaccine (2 - PCV) 06/18/2016   COVID-19 Vaccine (3 - 2023-24 season) 09/02/2021   Medicare Annual Wellness Visit  01/18/2022   Eye exam for diabetics  12/30/2021   Hemoglobin A1C  06/13/2022   Yearly kidney function blood test for diabetes  12/07/2022   Yearly kidney health urinalysis for diabetes  12/07/2022   Complete foot exam   12/13/2022   Flu Shot  Completed   HPV Vaccine  Aged Out    Advanced directives: ***  Conditions/risks identified: Aim for 30 minutes of exercise or brisk walking, 6-8 glasses of water, and 5 servings of fruits and vegetables each day.   Next appointment: Follow up in one year for your annual wellness visit.   Preventive Care 70 Years and Older, Male  Preventive care refers to lifestyle choices and visits with your  health care provider that can promote health and wellness. What does preventive care include? A yearly physical exam. This is also called an annual well check. Dental exams once or twice a year. Routine eye exams. Ask your health care provider how often you should have your eyes checked. Personal lifestyle choices, including: Daily care of your teeth and gums. Regular physical activity. Eating a healthy diet. Avoiding tobacco and drug use. Limiting alcohol use. Practicing safe sex. Taking low doses of aspirin every day. Taking vitamin and mineral supplements as recommended by your health care provider. What happens during an annual well check? The services and screenings done by your health care provider during your annual well check will depend on your age, overall health, lifestyle risk factors, and family history of disease. Counseling  Your health care provider may ask you questions about your: Alcohol use. Tobacco use. Drug use. Emotional well-being. Home and relationship well-being. Sexual activity. Eating habits. History of falls. Memory and ability to understand (cognition). Work and work Statistician. Screening  You may have the following tests or measurements: Height, weight, and BMI. Blood pressure. Lipid and cholesterol levels. These may be checked every 5 years, or more frequently if you are over 44 years old. Skin check. Lung cancer screening. You may have this screening every year starting at age 38 if you have a 30-pack-year history of smoking and currently smoke or have  quit within the past 15 years. Fecal occult blood test (FOBT) of the stool. You may have this test every year starting at age 33. Flexible sigmoidoscopy or colonoscopy. You may have a sigmoidoscopy every 5 years or a colonoscopy every 10 years starting at age 30. Prostate cancer screening. Recommendations will vary depending on your family history and other risks. Hepatitis C blood  test. Hepatitis B blood test. Sexually transmitted disease (STD) testing. Diabetes screening. This is done by checking your blood sugar (glucose) after you have not eaten for a while (fasting). You may have this done every 1-3 years. Abdominal aortic aneurysm (AAA) screening. You may need this if you are a current or former smoker. Osteoporosis. You may be screened starting at age 32 if you are at high risk. Talk with your health care provider about your test results, treatment options, and if necessary, the need for more tests. Vaccines  Your health care provider may recommend certain vaccines, such as: Influenza vaccine. This is recommended every year. Tetanus, diphtheria, and acellular pertussis (Tdap, Td) vaccine. You may need a Td booster every 10 years. Zoster vaccine. You may need this after age 77. Pneumococcal 13-valent conjugate (PCV13) vaccine. One dose is recommended after age 36. Pneumococcal polysaccharide (PPSV23) vaccine. One dose is recommended after age 46. Talk to your health care provider about which screenings and vaccines you need and how often you need them. This information is not intended to replace advice given to you by your health care provider. Make sure you discuss any questions you have with your health care provider. Document Released: 01/15/2015 Document Revised: 09/08/2015 Document Reviewed: 10/20/2014 Elsevier Interactive Patient Education  2017 [place] Prevention in the Home Falls can cause injuries. They can happen to people of all ages. There are many things you can do to make your home safe and to help prevent falls. What can I do on the outside of my home? Regularly fix the edges of walkways and driveways and fix any cracks. Remove anything that might make you trip as you walk through a door, such as a raised step or threshold. Trim any bushes or trees on the path to your home. Use bright outdoor lighting. Clear any walking paths of  anything that might make someone trip, such as rocks or tools. Regularly check to see if handrails are loose or broken. Make sure that both sides of any steps have handrails. Any raised decks and porches should have guardrails on the edges. Have any leaves, snow, or ice cleared regularly. Use sand or salt on walking paths during winter. Clean up any spills in your garage right away. This includes oil or grease spills. What can I do in the bathroom? Use night lights. Install grab bars by the toilet and in the tub and shower. Do not use towel bars as grab bars. Use non-skid mats or decals in the tub or shower. If you need to sit down in the shower, use a plastic, non-slip stool. Keep the floor dry. Clean up any water that spills on the floor as soon as it happens. Remove soap buildup in the tub or shower regularly. Attach bath mats securely with double-sided non-slip rug tape. Do not have throw rugs and other things on the floor that can make you trip. What can I do in the bedroom? Use night lights. Make sure that you have a light by your bed that is easy to reach. Do not use any sheets or blankets that are  too big for your bed. They should not hang down onto the floor. Have a firm chair that has side arms. You can use this for support while you get dressed. Do not have throw rugs and other things on the floor that can make you trip. What can I do in the kitchen? Clean up any spills right away. Avoid walking on wet floors. Keep items that you use a lot in easy-to-reach places. If you need to reach something above you, use a strong step stool that has a grab bar. Keep electrical cords out of the way. Do not use floor polish or wax that makes floors slippery. If you must use wax, use non-skid floor wax. Do not have throw rugs and other things on the floor that can make you trip. What can I do with my stairs? Do not leave any items on the stairs. Make sure that there are handrails on both  sides of the stairs and use them. Fix handrails that are broken or loose. Make sure that handrails are as long as the stairways. Check any carpeting to make sure that it is firmly attached to the stairs. Fix any carpet that is loose or worn. Avoid having throw rugs at the top or bottom of the stairs. If you do have throw rugs, attach them to the floor with carpet tape. Make sure that you have a light switch at the top of the stairs and the bottom of the stairs. If you do not have them, ask someone to add them for you. What else can I do to help prevent falls? Wear shoes that: Do not have high heels. Have rubber bottoms. Are comfortable and fit you well. Are closed at the toe. Do not wear sandals. If you use a stepladder: Make sure that it is fully opened. Do not climb a closed stepladder. Make sure that both sides of the stepladder are locked into place. Ask someone to hold it for you, if possible. Clearly mark and make sure that you can see: Any grab bars or handrails. First and last steps. Where the edge of each step is. Use tools that help you move around (mobility aids) if they are needed. These include: Canes. Walkers. Scooters. Crutches. Turn on the lights when you go into a dark area. Replace any light bulbs as soon as they burn out. Set up your furniture so you have a clear path. Avoid moving your furniture around. If any of your floors are uneven, fix them. If there are any pets around you, be aware of where they are. Review your medicines with your doctor. Some medicines can make you feel dizzy. This can increase your chance of falling. Ask your doctor what other things that you can do to help prevent falls. This information is not intended to replace advice given to you by your health care provider. Make sure you discuss any questions you have with your health care provider. Document Released: 10/15/2008 Document Revised: 05/27/2015 Document Reviewed: 01/23/2014 Elsevier  Interactive Patient Education  2017 [employer].

## 2022-01-27 DIAGNOSIS — G4733 Obstructive sleep apnea (adult) (pediatric): Secondary | ICD-10-CM | POA: Diagnosis not present

## 2022-01-31 ENCOUNTER — Encounter (HOSPITAL_COMMUNITY)
Admission: RE | Disposition: A | Discharge status: Home / Self Care | Payer: Self-pay | Source: Home / Self Care | Attending: Internal Medicine

## 2022-01-31 ENCOUNTER — Encounter (HOSPITAL_COMMUNITY): Payer: Self-pay | Admitting: Internal Medicine

## 2022-01-31 ENCOUNTER — Ambulatory Visit (HOSPITAL_COMMUNITY): Payer: Medicare HMO | Admitting: Certified Registered"

## 2022-01-31 ENCOUNTER — Ambulatory Visit (HOSPITAL_COMMUNITY)
Admission: RE | Admit: 2022-01-31 | Discharge: 2022-01-31 | Disposition: A | Discharge status: Home / Self Care | Payer: Medicare HMO | Attending: Internal Medicine | Admitting: Internal Medicine

## 2022-01-31 ENCOUNTER — Ambulatory Visit (HOSPITAL_BASED_OUTPATIENT_CLINIC_OR_DEPARTMENT_OTHER): Payer: Medicare HMO | Admitting: Certified Registered"

## 2022-01-31 DIAGNOSIS — D124 Benign neoplasm of descending colon: Secondary | ICD-10-CM | POA: Diagnosis not present

## 2022-01-31 DIAGNOSIS — E119 Type 2 diabetes mellitus without complications: Secondary | ICD-10-CM | POA: Diagnosis not present

## 2022-01-31 DIAGNOSIS — K2100 Gastro-esophageal reflux disease with esophagitis, without bleeding: Secondary | ICD-10-CM

## 2022-01-31 DIAGNOSIS — I10 Essential (primary) hypertension: Secondary | ICD-10-CM | POA: Diagnosis not present

## 2022-01-31 DIAGNOSIS — D128 Benign neoplasm of rectum: Secondary | ICD-10-CM | POA: Insufficient documentation

## 2022-01-31 DIAGNOSIS — K2970 Gastritis, unspecified, without bleeding: Secondary | ICD-10-CM | POA: Diagnosis not present

## 2022-01-31 DIAGNOSIS — K621 Rectal polyp: Secondary | ICD-10-CM | POA: Diagnosis not present

## 2022-01-31 DIAGNOSIS — Z7901 Long term (current) use of anticoagulants: Secondary | ICD-10-CM | POA: Insufficient documentation

## 2022-01-31 DIAGNOSIS — D509 Iron deficiency anemia, unspecified: Secondary | ICD-10-CM | POA: Insufficient documentation

## 2022-01-31 DIAGNOSIS — I2510 Atherosclerotic heart disease of native coronary artery without angina pectoris: Secondary | ICD-10-CM | POA: Diagnosis not present

## 2022-01-31 DIAGNOSIS — I4891 Unspecified atrial fibrillation: Secondary | ICD-10-CM | POA: Diagnosis not present

## 2022-01-31 DIAGNOSIS — E611 Iron deficiency: Secondary | ICD-10-CM | POA: Diagnosis not present

## 2022-01-31 DIAGNOSIS — D123 Benign neoplasm of transverse colon: Secondary | ICD-10-CM

## 2022-01-31 DIAGNOSIS — K635 Polyp of colon: Secondary | ICD-10-CM | POA: Insufficient documentation

## 2022-01-31 DIAGNOSIS — Z87891 Personal history of nicotine dependence: Secondary | ICD-10-CM | POA: Diagnosis not present

## 2022-01-31 DIAGNOSIS — D125 Benign neoplasm of sigmoid colon: Secondary | ICD-10-CM

## 2022-01-31 DIAGNOSIS — Z86010 Personal history of colonic polyps: Secondary | ICD-10-CM | POA: Diagnosis not present

## 2022-01-31 HISTORY — PX: ESOPHAGOGASTRODUODENOSCOPY (EGD) WITH PROPOFOL: SHX5813

## 2022-01-31 HISTORY — PX: POLYPECTOMY: SHX5525

## 2022-01-31 HISTORY — PX: BIOPSY: SHX5522

## 2022-01-31 HISTORY — PX: COLONOSCOPY WITH PROPOFOL: SHX5780

## 2022-01-31 LAB — GLUCOSE, CAPILLARY
Glucose-Capillary: 358 mg/dL — ABNORMAL HIGH (ref 70–99)
Glucose-Capillary: 356 mg/dL — ABNORMAL HIGH (ref 70–99)
Glucose-Capillary: 291 mg/dL — ABNORMAL HIGH (ref 70–99)

## 2022-01-31 SURGERY — COLONOSCOPY WITH PROPOFOL
Anesthesia: Monitor Anesthesia Care

## 2022-01-31 MED ORDER — INSULIN ASPART 100 UNIT/ML IJ SOLN
INTRAMUSCULAR | Status: AC
  Filled 2022-01-31: qty 1

## 2022-01-31 MED ORDER — PROPOFOL 500 MG/50ML IV EMUL
INTRAVENOUS | Status: DC | PRN
  Administered 2022-01-31: 150 ug/kg/min via INTRAVENOUS

## 2022-01-31 MED ORDER — SODIUM CHLORIDE 0.9 % IV SOLN: INTRAVENOUS | Status: DC

## 2022-01-31 MED ORDER — GLYCOPYRROLATE 0.2 MG/ML IJ SOLN
INTRAMUSCULAR | Status: DC | PRN
  Administered 2022-01-31: .1 mg via INTRAVENOUS

## 2022-01-31 MED ORDER — INSULIN ASPART 100 UNIT/ML IJ SOLN
50.0000 [IU] | Freq: Once | INTRAMUSCULAR | Status: AC
  Administered 2022-01-31: 50 [IU] via SUBCUTANEOUS

## 2022-01-31 MED ORDER — PROPOFOL 10 MG/ML IV BOLUS
INTRAVENOUS | Status: DC | PRN
  Administered 2022-01-31 (×2): 20 mg via INTRAVENOUS
  Administered 2022-01-31: 30 mg via INTRAVENOUS
  Administered 2022-01-31: 20 mg via INTRAVENOUS
  Administered 2022-01-31: 30 mg via INTRAVENOUS
  Administered 2022-01-31: 20 mg via INTRAVENOUS

## 2022-01-31 MED ORDER — LACTATED RINGERS IV SOLN: INTRAVENOUS | Status: DC

## 2022-01-31 MED ORDER — LIDOCAINE HCL (CARDIAC) PF 100 MG/5ML IV SOSY
PREFILLED_SYRINGE | INTRAVENOUS | Status: DC | PRN
  Administered 2022-01-31: 20 mg via INTRAVENOUS

## 2022-01-31 MED ORDER — PHENYLEPHRINE 80 MCG/ML (10ML) SYRINGE FOR IV PUSH (FOR BLOOD PRESSURE SUPPORT)
PREFILLED_SYRINGE | INTRAVENOUS | Status: DC | PRN
  Administered 2022-01-31 (×5): 160 ug via INTRAVENOUS
  Administered 2022-01-31: 80 ug via INTRAVENOUS
  Administered 2022-01-31 (×4): 160 ug via INTRAVENOUS
  Administered 2022-01-31: 80 ug via INTRAVENOUS

## 2022-01-31 MED ORDER — DEXMEDETOMIDINE HCL IN NACL 80 MCG/20ML IV SOLN
INTRAVENOUS | Status: DC | PRN
  Administered 2022-01-31 (×3): 4 ug via BUCCAL

## 2022-01-31 MED ORDER — INSULIN ASPART 100 UNIT/ML IJ SOLN: 100.0000 [IU] | Freq: Once | INTRAMUSCULAR | Status: DC

## 2022-01-31 SURGICAL SUPPLY — 25 items

## 2022-01-31 NOTE — Brief Op Note (Signed)
01/31/2022  9:44 AM  PATIENT:  Paul Baker  72 y.o. male  PRE-OPERATIVE DIAGNOSIS:  History of Polyps and Iron Defiency  POST-OPERATIVE DIAGNOSIS:  gastritis status post biopsies rule out H. pylori, duodenal biopsies r/o celiac disease, esophagitis LA grade B, transverse polyps x 6 with cold snare, descending colon polyp cold snare x 3, sigmoid colon polyp with biopsy forceps, rectal polyp with cold snare  PROCEDURE:  Procedure(s): COLONOSCOPY WITH PROPOFOL (N/A) ESOPHAGOGASTRODUODENOSCOPY (EGD) WITH PROPOFOL (N/A) BIOPSY POLYPECTOMY  SURGEON:  Surgeon(s) and Role:    Loney Laurence, DO - Primary  Recommendations: -Continue current therapy with pantoprazole -Follow-up pathology specimens -Repeat colonoscopy pending pathology -Okay to resume anticoagulation on 02/01/2022  Danton Clap, DO [facility]

## 2022-01-31 NOTE — H&P (Signed)
Paul Baker is a 72 year old male with past medical history significant for type 2 diabetes mellitus, atrial fibrillation on Eliquis.  His last dose of Eliquis was in the morning on 01/29/2021.  He has had no recent changes in his bowel habits.  No abdominal pain.  He has chronic angina, unchanged from prior.  No current shortness of breath, he does have some shortness of breath on exertion when the weather is cold.  He was seen and evaluated in office on 10/03/2021.  At that time he noted having acid reflux sensation, he was started on pantoprazole, he noted having significant symptom relief with this.  At time of office appointment he was recommended to have repeat colonoscopy given personal history of polyp.  Last colonoscopy 03/30/2009 with finding of 1 adenomatous polyp in the rectosigmoid colon.  He noted his mother may have had colon cancer, there was metastatic disease to the liver.  I personally discussed EGD and colonoscopy procedures in detail, benefits, alternatives and risks of procedures including bleeding/infection/perforation/missed lesion.  He wished to proceed.  Assessment: Personal history colon polyps Gastroesophageal reflux disease Atrial fibrillation on Eliquis, last dose 01/29/2021  Plan: -Proceed to EGD and colonoscopy today  Danton Clap, DO [facility]

## 2022-01-31 NOTE — Op Note (Signed)
[facility] Patient Name: Paul Baker Procedure Date: 01/31/2022 MRN: [id number] Attending MD: Danton Clap DO, DO, [id number] Date of Birth: Jan 23, 1950 CSN: [id number] Age: 72 Admit Type: Outpatient Procedure:                Colonoscopy Indications:              Iron deficiency anemia Providers:                Danton Clap DO, DO, Jaci Carrel, RN, Darliss Cheney, Technician, Nicholes Calamity Referring MD:              Medicines:                See the Anesthesia note for documentation of the                            administered medications Complications:            No immediate complications. Estimated blood loss:                            Minimal. Estimated Blood Loss:     Estimated blood loss was minimal. Procedure:                Pre-Anesthesia Assessment:                           - ASA Grade Assessment: III - A patient with severe                            systemic disease.                           - The risks and benefits of the procedure and the                            sedation options and risks were discussed with the                            patient. All questions were answered and informed                            consent was obtained.                           After obtaining informed consent, the colonoscope                            was passed under direct vision. Throughout the                            procedure, the patient's blood pressure, pulse, and                            oxygen saturations were monitored continuously. The  [id number]-HQ190L ([id number] Olympus colonoscope was                            introduced through the anus and advanced to the the                            terminal ileum, with identification of the                            appendiceal orifice and IC valve. The colonoscopy                            was performed without difficulty. The patient                             tolerated the procedure well. The quality of the                            bowel preparation was evaluated using the BBPS                            [facility] Bowel Preparation Scale) with scores of:                            Right Colon = 2 (minor amount of residual staining,                            small fragments of stool and/or opaque liquid, but                            mucosa seen well), Transverse Colon = 3 (entire                            mucosa seen well with no residual staining, small                            fragments of stool or opaque liquid) and Left Colon                            = 3 (entire mucosa seen well with no residual                            staining, small fragments of stool or opaque                            liquid). The total BBPS score equals 8. The quality                            of the bowel preparation was good. Scope In: 8:44:26 AM Scope Out: 9:27:17 AM Scope Withdrawal Time: 0 hours 26 minutes 9 seconds  Total Procedure Duration: 0 hours 42 minutes 51 seconds  Findings:      The perianal and digital rectal  examinations were normal.      Six sessile polyps were found in the transverse colon. The polyps were 3       to 8 mm in size. These polyps were removed with a cold snare. Resection       and retrieval were complete.      Two sessile polyps were found in the descending colon. The polyps were 3       to 5 mm in size. These polyps were removed with a cold snare. Resection       and retrieval were complete.      A 4 mm polyp was found in the descending colon. The polyp was sessile.       The polyp was removed with a cold snare. Resection and retrieval were       complete.      A diminutive polyp was found in the sigmoid colon. The polyp was       sessile. The polyp was removed with a cold biopsy forceps. Resection and       retrieval were complete.      A 5 mm polyp was found in the rectum. The polyp was sessile.  The polyp       was removed with a cold snare. Resection and retrieval were complete.      The terminal ileum appeared normal. Impression:               - Six 3 to 8 mm polyps in the transverse colon,                            removed with a cold snare. Resected and retrieved.                           - Two 3 to 5 mm polyps in the descending colon,                            removed with a cold snare. Resected and retrieved.                           - One 4 mm polyp in the descending colon, removed                            with a cold snare. Resected and retrieved.                           - One diminutive polyp in the sigmoid colon,                            removed with a cold biopsy forceps. Resected and                            retrieved.                           - One 5 mm polyp in the rectum, removed with a cold  snare. Resected and retrieved.                           - The examined portion of the ileum was normal. Moderate Sedation:      See the other procedure note for documentation of moderate sedation with       intraservice time. Recommendation:           - Discharge patient to home.                           - Resume previous diet.                           - Continue present medications.                           - Await pathology results.                           - Repeat colonoscopy date to be determined after                            pending pathology results are reviewed for                            surveillance.                           - Return to GI office PRN. Procedure Code(s):        --- Professional ---                           [phone], Colonoscopy, flexible; with removal of                            tumor(s), polyp(s), or other lesion(s) by snare                            technique                           62229, 30, Colonoscopy, flexible; with biopsy,                            single or multiple Diagnosis Code(s):         --- Professional ---                           D12.3, Benign neoplasm of transverse colon (hepatic                            flexure or splenic flexure)                           D12.4, Benign neoplasm of descending colon  D12.8, Benign neoplasm of rectum                           D12.5, Benign neoplasm of sigmoid colon                           D50.9, Iron deficiency anemia, unspecified CPT copyright 2022 American Medical Association. All rights reserved. The codes documented in this report are preliminary and upon coder review may  be revised to meet current compliance requirements. Dr Danton Clap, DO Danton Clap DO, DO 01/31/2022 9:42:57 AM Number of Addenda: 0

## 2022-01-31 NOTE — Op Note (Signed)
[facility] Patient Name: Paul Baker Procedure Date: 01/31/2022 MRN: [id number] Attending MD: Danton Clap DO, DO, [id number] Date of Birth: August 30, 1950 CSN: [id number] Age: 72 Admit Type: Outpatient Procedure:                Upper GI endoscopy Indications:              Iron deficiency anemia, Suspected esophageal reflux Providers:                Danton Clap DO, DO, Jaci Carrel, RN, Darliss Cheney, Technician, Nicholes Calamity Referring MD:              Medicines:                See the Anesthesia note for documentation of the                            administered medications Complications:            No immediate complications. Estimated Blood Loss:     Estimated blood loss was minimal. Procedure:                Pre-Anesthesia Assessment:                           - ASA Grade Assessment: III - A patient with severe                            systemic disease.                           - The risks and benefits of the procedure and the                            sedation options and risks were discussed with the                            patient. All questions were answered and informed                            consent was obtained.                           After obtaining informed consent, the endoscope was                            passed under direct vision. Throughout the                            procedure, the patient's blood pressure, pulse, and                            oxygen saturations were monitored continuously. The                            [id number]-[id number] ([id number])  Olympus endoscope was introduced                            through the mouth, and advanced to the second part                            of duodenum. The upper GI endoscopy was                            accomplished without difficulty. The patient                            tolerated the procedure well. Scope In: Scope Out: Findings:      LA  Grade B (one or more mucosal breaks greater than 5 mm, not extending       between the tops of two mucosal folds) esophagitis with no bleeding was       found at the gastroesophageal junction.      Diffuse mild inflammation characterized by erythema was found in the       stomach. Biopsies were taken with a cold forceps for Helicobacter pylori       testing.      The examined duodenum was normal. Biopsies for histology were taken with       a cold forceps for evaluation of celiac disease. Impression:               - LA Grade B reflux esophagitis with no bleeding.                           - Gastritis. Biopsied.                           - Normal examined duodenum. Biopsied. Moderate Sedation:      See the other procedure note for documentation of moderate sedation with       intraservice time. Recommendation:           - Await pathology results.                           - Continue present medications.                           - Perform a colonoscopy today. Procedure Code(s):        --- Professional ---                           [phone], Esophagogastroduodenoscopy, flexible,                            transoral; with biopsy, single or multiple Diagnosis Code(s):        --- Professional ---                           K21.00, Gastro-esophageal reflux disease with                            esophagitis, without bleeding  K29.70, Gastritis, unspecified, without bleeding                           D50.9, Iron deficiency anemia, unspecified CPT copyright 2022 American Medical Association. All rights reserved. The codes documented in this report are preliminary and upon coder review may  be revised to meet current compliance requirements. Dr Danton Clap, [place], DO 01/31/2022 9:32:31 AM Number of Addenda: 0

## 2022-01-31 NOTE — Anesthesia Postprocedure Evaluation (Signed)
Anesthesia Post Note  Patient: Paul Baker  Procedure(s) Performed: COLONOSCOPY WITH PROPOFOL ESOPHAGOGASTRODUODENOSCOPY (EGD) WITH PROPOFOL BIOPSY POLYPECTOMY     Patient location during evaluation: Endoscopy Anesthesia Type: MAC Level of consciousness: awake Pain management: pain level controlled Vital Signs Assessment: post-procedure vital signs reviewed and stable Respiratory status: spontaneous breathing Cardiovascular status: stable Postop Assessment: no apparent nausea or vomiting Anesthetic complications: no  No notable events documented.  Last Vitals:  Vitals:   01/31/22 1010 01/31/22 1020  BP: 106/69 129/74  Pulse: 79 77  Resp: 19 12  Temp:    SpO2: 95% 100%    Last Pain:  Vitals:   01/31/22 1020  TempSrc:   PainSc: 0-No pain                 Huston Foley

## 2022-01-31 NOTE — Transfer of Care (Signed)
Immediate Anesthesia Transfer of Care Note  Patient: Paul Baker  Procedure(s) Performed: COLONOSCOPY WITH PROPOFOL ESOPHAGOGASTRODUODENOSCOPY (EGD) WITH PROPOFOL BIOPSY POLYPECTOMY  Patient Location: PACU and Endoscopy Unit  Anesthesia Type:MAC  Level of Consciousness: drowsy  Airway & Oxygen Therapy: Patient Spontanous Breathing and Patient connected to face mask oxygen  Post-op Assessment: Report given to RN and Post -op Vital signs reviewed and stable  Post vital signs: Reviewed and stable  Last Vitals:  Vitals Value Taken Time  BP    Temp    Pulse 84 01/31/22 0936  Resp 22 01/31/22 0936  SpO2 100 % 01/31/22 0936  Vitals shown include unvalidated device data.  Last Pain:  Vitals:   01/31/22 0741  TempSrc: Temporal  PainSc: 0-No pain         Complications: No notable events documented.

## 2022-01-31 NOTE — Discharge Instructions (Signed)
YOU HAD AN ENDOSCOPIC PROCEDURE TODAY: Refer to the procedure report and other information in the discharge instructions given to you for any specific questions about what was found during the examination. If this information does not answer your questions, please call [facility] office at [phone] to clarify.   YOU SHOULD EXPECT: Some feelings of bloating in the abdomen. Passage of more gas than usual. Walking can help get rid of the air that was put into your GI tract during the procedure and reduce the bloating. If you had a lower endoscopy (such as a colonoscopy or flexible sigmoidoscopy) you may notice spotting of blood in your stool or on the toilet paper. Some abdominal soreness may be present for a day or two, also.  DIET: Your first meal following the procedure should be a light meal and then it is ok to progress to your normal diet. A half-sandwich or bowl of soup is an example of a good first meal. Heavy or fried foods are harder to digest and may make you feel nauseous or bloated. Drink plenty of fluids but you should avoid alcoholic beverages for 24 hours. If you had a esophageal dilation, please see attached instructions for diet.    ACTIVITY: Your care partner should take you home directly after the procedure. You should plan to take it easy, moving slowly for the rest of the day. You can resume normal activity the day after the procedure however YOU SHOULD NOT DRIVE, use power tools, machinery or perform tasks that involve climbing or major physical exertion for 24 hours (because of the sedation medicines used during the test).   SYMPTOMS TO REPORT IMMEDIATELY: A gastroenterologist can be reached at any hour. Please call [phone]  for any of the following symptoms:  . Following lower endoscopy (colonoscopy, flexible sigmoidoscopy) Excessive amounts of blood in the stool  Significant tenderness, worsening of abdominal pains  Swelling of the abdomen that is new, acute  Fever of 100  or higher  . Following upper endoscopy (EGD, EUS, ERCP, esophageal dilation) Vomiting of blood or coffee ground material  New, significant abdominal pain  New, significant chest pain or pain under the shoulder blades  Painful or persistently difficult swallowing  New shortness of breath  Black, tarry-looking or red, bloody stools  FOLLOW UP:  If any biopsies were taken you will be contacted by phone or by letter within the next 1-3 weeks. Call [phone]  if you have not heard about the biopsies in 3 weeks.  Please also call with any specific questions about appointments or follow up tests. YOU HAD AN ENDOSCOPIC PROCEDURE TODAY: Refer to the procedure report and other information in the discharge instructions given to you for any specific questions about what was found during the examination. If this information does not answer your questions, please call [facility] office at [phone] to clarify.   YOU SHOULD EXPECT: Some feelings of bloating in the abdomen. Passage of more gas than usual. Walking can help get rid of the air that was put into your GI tract during the procedure and reduce the bloating. If you had a lower endoscopy (such as a colonoscopy or flexible sigmoidoscopy) you may notice spotting of blood in your stool or on the toilet paper. Some abdominal soreness may be present for a day or two, also.  DIET: Your first meal following the procedure should be a light meal and then it is ok to progress to your normal diet. A half-sandwich or bowl of soup   is an example of a good first meal. Heavy or fried foods are harder to digest and may make you feel nauseous or bloated. Drink plenty of fluids but you should avoid alcoholic beverages for 24 hours. If you had a esophageal dilation, please see attached instructions for diet.    ACTIVITY: Your care partner should take you home directly after the procedure. You should plan to take it easy, moving slowly for the rest of the day. You can  resume normal activity the day after the procedure however YOU SHOULD NOT DRIVE, use power tools, machinery or perform tasks that involve climbing or major physical exertion for 24 hours (because of the sedation medicines used during the test).   SYMPTOMS TO REPORT IMMEDIATELY: A gastroenterologist can be reached at any hour. Please call [phone]  for any of the following symptoms:  . Following lower endoscopy (colonoscopy, flexible sigmoidoscopy) Excessive amounts of blood in the stool  Significant tenderness, worsening of abdominal pains  Swelling of the abdomen that is new, acute  Fever of 100 or higher  . Following upper endoscopy (EGD, EUS, ERCP, esophageal dilation) Vomiting of blood or coffee ground material  New, significant abdominal pain  New, significant chest pain or pain under the shoulder blades  Painful or persistently difficult swallowing  New shortness of breath  Black, tarry-looking or red, bloody stools  FOLLOW UP:  If any biopsies were taken you will be contacted by phone or by letter within the next 1-3 weeks. Call [phone]  if you have not heard about the biopsies in 3 weeks.  Please also call with any specific questions about appointments or follow up tests.  

## 2022-01-31 NOTE — Anesthesia Preprocedure Evaluation (Signed)
Anesthesia Evaluation  Patient identified by MRN, date of birth, ID band Patient awake    Reviewed: Allergy & Precautions, NPO status , Patient's Chart, lab work & pertinent test results, reviewed documented beta blocker date and time   Airway Mallampati: I       Dental  (+) Poor Dentition, Caps, Missing   Pulmonary asthma , sleep apnea and Continuous Positive Airway Pressure Ventilation , former smoker   Pulmonary exam normal        Cardiovascular hypertension, Pt. on home beta blockers + CAD  Normal cardiovascular exam+ dysrhythmias Atrial Fibrillation      Neuro/Psych    GI/Hepatic Neg liver ROS,GERD  Medicated,,  Endo/Other  diabetes, Insulin DependentHypothyroidism    Renal/GU negative Renal ROS     Musculoskeletal  (+) Arthritis , Osteoarthritis,    Abdominal Normal abdominal exam  (+)   Peds  Hematology   Anesthesia Other Findings   Reproductive/Obstetrics                             Anesthesia Physical Anesthesia Plan  ASA: 3  Anesthesia Plan: MAC   Post-op Pain Management:    Induction:   PONV Risk Score and Plan: Propofol infusion and TIVA  Airway Management Planned: Natural Airway, Nasal Cannula and Simple Face Mask  Additional Equipment: None  Intra-op Plan:   Post-operative Plan:   Informed Consent: I have reviewed the patients History and Physical, chart, labs and discussed the procedure including the risks, benefits and alternatives for the proposed anesthesia with the patient or authorized representative who has indicated his/her understanding and acceptance.       Plan Discussed with: CRNA  Anesthesia Plan Comments:        Anesthesia Quick Evaluation

## 2022-02-02 LAB — SURGICAL PATHOLOGY

## 2022-02-13 DIAGNOSIS — E1110 Type 2 diabetes mellitus with ketoacidosis without coma: Secondary | ICD-10-CM | POA: Diagnosis not present

## 2022-02-22 DIAGNOSIS — J329 Chronic sinusitis, unspecified: Secondary | ICD-10-CM | POA: Diagnosis not present

## 2022-02-22 DIAGNOSIS — Z6837 Body mass index (BMI) 37.0-37.9, adult: Secondary | ICD-10-CM | POA: Diagnosis not present

## 2022-02-22 DIAGNOSIS — E669 Obesity, unspecified: Secondary | ICD-10-CM | POA: Diagnosis not present

## 2022-02-24 ENCOUNTER — Ambulatory Visit (INDEPENDENT_AMBULATORY_CARE_PROVIDER_SITE_OTHER): Payer: Medicare HMO | Admitting: Family Medicine

## 2022-02-24 VITALS — BP 127/73 | HR 69 | Wt 229.0 lb

## 2022-02-24 DIAGNOSIS — K122 Cellulitis and abscess of mouth: Secondary | ICD-10-CM | POA: Diagnosis not present

## 2022-02-24 DIAGNOSIS — E1159 Type 2 diabetes mellitus with other circulatory complications: Secondary | ICD-10-CM | POA: Diagnosis not present

## 2022-02-24 DIAGNOSIS — I779 Disorder of arteries and arterioles, unspecified: Secondary | ICD-10-CM

## 2022-02-24 DIAGNOSIS — K120 Recurrent oral aphthae: Secondary | ICD-10-CM

## 2022-02-24 DIAGNOSIS — D649 Anemia, unspecified: Secondary | ICD-10-CM

## 2022-02-24 DIAGNOSIS — Z23 Encounter for immunization: Secondary | ICD-10-CM

## 2022-02-24 DIAGNOSIS — I25119 Atherosclerotic heart disease of native coronary artery with unspecified angina pectoris: Secondary | ICD-10-CM | POA: Diagnosis not present

## 2022-02-24 DIAGNOSIS — I480 Paroxysmal atrial fibrillation: Secondary | ICD-10-CM | POA: Diagnosis not present

## 2022-02-24 DIAGNOSIS — J4550 Severe persistent asthma, uncomplicated: Secondary | ICD-10-CM | POA: Diagnosis not present

## 2022-02-24 NOTE — Progress Notes (Signed)
   Subjective:    Patient ID: Paul Baker, male    DOB: 1950-06-15, 72 y.o.   MRN: [id number]  HPI Patient is here today for abscess in gum.  Patient states that this started off in the lower gumline then seem to get better now it is in the upper gumline causing his upper lip and cheek area feels swollen and tender.  Hurts to chew on that side.  Will be seeing the dentist next week  Oral abscess  Aphthous ulcer  Anemia, unspecified type  Need for vaccination - Plan: Pneumococcal conjugate vaccine 20-valent (Prevnar 20)   Review of Systems     Objective:   Physical Exam General-in no acute distress Eyes-no discharge Lungs-respiratory rate normal, CTA CV-no murmurs,RRR Extremities skin warm dry no edema Neuro grossly normal Behavior normal, alert Upper gumline on the right incisor region is slightly swollen there is a aphthous ulcer but it is very tender in that region cheek area above this is also swollen  Patient states he did get his eye exam late last fall we are requesting copy of the record     Assessment & Plan:  1. Oral abscess It is hard to know if this is purely bacterial infection or if it is more swelling from aphthous ulcer we will go ahead with antibiotics for 7 days see dentist next week for follow-up  2. Aphthous ulcer Orabase-B as needed  3. Anemia, unspecified type Hemoglobin slightly low but it has been this way for almost 9 years.  Ferritin is in a good range but saturation slightly low but transferrin is normal.  I think this is more likely anemia of chronic disease but we will touch base with hematology to see if further evaluation is warranted  4. Need for vaccination Pneumococcal vaccine indicated - Pneumococcal conjugate vaccine 20-valent (Prevnar 20)  Patient to do Tdap and Shingrix via pharmacy  1. Carotid artery disease, unspecified laterality, unspecified type ([place]) Patient is on statin he is working hard to get diabetes under good  control he is staying physically active as best he can tolerate  2. Morbid (severe) obesity due to excess calories ([place]) Portion control regular physical activity  3. DM type 2 causing vascular disease ([place]) Diabetes being followed by endocrinology trying to keep her A1c under good control  4. Paroxysmal atrial fibrillation ([facility]) On blood thinners followed by cardiology  6. Coronary artery disease involving native coronary artery of native heart with angina pectoris ([place]) On medication followed by cardiology  10. Severe persistent asthma without complication Followed by pulmonology stable currently

## 2022-02-26 ENCOUNTER — Telehealth: Payer: Self-pay | Admitting: Family Medicine

## 2022-02-26 DIAGNOSIS — D649 Anemia, unspecified: Secondary | ICD-10-CM

## 2022-02-26 NOTE — Telephone Encounter (Signed)
Nurses When patient was last in the office it was noted that he has had some chronic mild anemia I touched base with hematology they recommended some further testing to be on the safe side to make sure there is not underlying issues Please touch base with Paul Baker and let him know that we did discuss the case with hematology and they recommend the following  Serum protein electrophoresis, light chains-kappa/lambda, immunofixation electrophoresis  Preferably on like for him to complete this within the next 2 to 3 weeks so that it does not slip by the [facility]  If the lab work looks good that is reassuring if there is any problems we would move forward with consultation with hematology  Please reassure the patient that this is just being thorough thank you-Dr. Sallee Lange if any questions let me know

## 2022-02-27 DIAGNOSIS — G4733 Obstructive sleep apnea (adult) (pediatric): Secondary | ICD-10-CM | POA: Diagnosis not present

## 2022-02-27 NOTE — Telephone Encounter (Signed)
Patient advised per Dr Nicki Reaper: Nurses When patient was last in the office it was noted that he has had some chronic mild anemia I touched base with hematology they recommended some further testing to be on the safe side to make sure there is not underlying issues Please touch base with Paul Baker and let him know that we did discuss the case with hematology and they recommend the following   Serum protein electrophoresis, light chains-kappa/lambda, immunofixation electrophoresis   Preferably on like for him to complete this within the next 2 to 3 weeks so that it does not slip by the [facility]   If the lab work looks good that is reassuring if there is any problems we would move forward with consultation with hematology   Please reassure the patient that this is just being thorough  Patient verbalized understanding.  Blood work ordered in [employer].

## 2022-02-27 NOTE — Telephone Encounter (Signed)
Left message to return call.  

## 2022-02-28 DIAGNOSIS — D649 Anemia, unspecified: Secondary | ICD-10-CM | POA: Diagnosis not present

## 2022-03-07 ENCOUNTER — Telehealth: Payer: Self-pay | Admitting: *Deleted

## 2022-03-07 DIAGNOSIS — R768 Other specified abnormal immunological findings in serum: Secondary | ICD-10-CM

## 2022-03-07 LAB — PROTEIN ELECTROPHORESIS, SERUM
A/G Ratio: 1.2 (ref 0.7–1.7)
Albumin ELP: 3.6 g/dL (ref 2.9–4.4)
Alpha 1: 0.2 g/dL (ref 0.0–0.4)
Alpha 2: 1 g/dL (ref 0.4–1.0)
Beta: 1 g/dL (ref 0.7–1.3)
Gamma Globulin: 0.9 g/dL (ref 0.4–1.8)
Globulin, Total: 3 g/dL (ref 2.2–3.9)

## 2022-03-07 LAB — IMMUNOFIXATION ELECTROPHORESIS
IgA/Immunoglobulin A, Serum: 174 mg/dL (ref 61–437)
IgG (Immunoglobin G), Serum: 902 mg/dL (ref 603–1613)
IgM (Immunoglobulin M), Srm: 38 mg/dL (ref 20–172)
Total Protein: 6.6 g/dL (ref 6.0–8.5)

## 2022-03-07 LAB — KAPPA/LAMBDA LIGHT CHAINS
Ig Kappa Free Light Chain: 35.7 mg/L — ABNORMAL HIGH (ref 3.3–19.4)
Ig Lambda Free Light Chain: 25.3 mg/L (ref 5.7–26.3)
KAPPA/LAMBDA RATIO: 1.41 (ref 0.26–1.65)

## 2022-03-07 NOTE — Telephone Encounter (Signed)
Patient states he is returning call for lab results from Dr Nicki Reaper

## 2022-03-07 NOTE — Telephone Encounter (Signed)
After shared discussion with patient Please go ahead with referral to Dr. Delton Coombes for elevated light chain Patient is aware thank you

## 2022-03-08 NOTE — Telephone Encounter (Signed)
Referral ordered in [facility]. 

## 2022-03-13 ENCOUNTER — Ambulatory Visit: Payer: Medicare HMO | Admitting: Nurse Practitioner

## 2022-03-15 DIAGNOSIS — E1159 Type 2 diabetes mellitus with other circulatory complications: Secondary | ICD-10-CM | POA: Diagnosis not present

## 2022-03-16 ENCOUNTER — Telehealth: Payer: Self-pay | Admitting: Family Medicine

## 2022-03-16 NOTE — Telephone Encounter (Signed)
Called patient to schedule Medicare Annual Wellness Visit (AWV). Left message for patient to call back and schedule Medicare Annual Wellness Visit (AWV).  Last date of AWV:  01/18/2021    Please schedule an appointment at any time with Loma Sousa, [facility] .  If any questions, please contact me at [phone].  Thank you,  Colletta [place],  [facility] Direct Dial ??[phone]

## 2022-03-28 ENCOUNTER — Other Ambulatory Visit: Payer: Self-pay | Admitting: Nurse Practitioner

## 2022-03-28 DIAGNOSIS — G4733 Obstructive sleep apnea (adult) (pediatric): Secondary | ICD-10-CM | POA: Diagnosis not present

## 2022-03-30 NOTE — Patient Instructions (Signed)

## 2022-03-31 ENCOUNTER — Telehealth: Payer: Self-pay | Admitting: Cardiology

## 2022-03-31 NOTE — Telephone Encounter (Signed)
Patient states that he has been having SOB and fluid in legs, hands and arms for 1.5 weeks. Says that he has gained about 8-10 lbs. Went to urinate yesterday and lost 3 lbs but has since gained it back. Reviewed medication list. States that he does not always take the furosemide as directed because he feels that 80 mg in the morning and 40 mg in the evening is too much so most of the time he just takes 80 mg. Took 80 mg of lasix this morning. Intermittent chest pain 5-6/10, some dizziness and balance issues. Took nitro last week and chest pains resolved for at least 1/2 day but has not taken since. Patient informed that message will be sent to provider and if in the meantime, his symptoms gets worse, he needs to go to the ED or call 911. Verbalized understanding.

## 2022-03-31 NOTE — Telephone Encounter (Signed)
Called the pt back, let him know I was waiting for triage. Made him aware that someone will call him soon.

## 2022-03-31 NOTE — Telephone Encounter (Signed)
Pt c/o Shortness Of Breath: STAT if SOB developed within the last 24 hours or pt is noticeably SOB on the phone  1. Are you currently SOB (can you hear that pt is SOB on the phone)? Yes  2. How long have you been experiencing SOB? Just got worse this wk  3. Are you SOB when sitting or when up moving around? At any period of time  4. Are you currently experiencing any other symptoms? At times, he is dizzy, lightheaded.    Pt c/o swelling: STAT is pt has developed SOB within 24 hours  How much weight have you gained and in what time span? States he has gained weight from the extra fluid, he states about 8 lbs.  If swelling, where is the swelling located? In both of his legs and feet, his hands, and his stomach  Are you currently taking a fluid pill? Yes, furosemide - can take a max of 120mg . But going that high in dosage makes him dizzy  Are you currently SOB? Yes  Do you have a log of your daily weights (if so, list)? No  Have you gained 3 pounds in a day or 5 pounds in a week? unsure  Have you traveled recently? Yes   While waiting for triage, the pt hung up mid call.

## 2022-03-31 NOTE — Telephone Encounter (Signed)
Patient made aware, verbalized understanding.  Appointment scheduled with Finis Bud, Friday 4/5 @ 9:00 am at the [place] office. Reminded patient again that if symptoms get worse to go to ED or call 911. Verbalized understanding.

## 2022-03-31 NOTE — Telephone Encounter (Signed)
   I have not evaluated this patient since 2020. Appears he did see Dr. Domenic Polite in 10/2021 and was taking Lasix 40mg  daily alternating with 80mg  daily so I am not sure who adjusted his dose to 80mg  in AM/40mg  in PM. For his fluid, he should take 80mg  in AM/40mg  in PM for the next several days if not doing so in an effort to get back to his baseline weight. If too dizzy, can try 80mg  in AM/20mg  in PM. Limit fluid intake to less than 2L daily and limit sodium intake. If he is having chest pain, I agree with Emergency Department evaluation. Would recommend a follow-up visit in the office with Dr. Domenic Polite or an APP given his last visit was in 10/2021.  Signed, Erma Heritage, PA-C 03/31/2022, 12:00 PM

## 2022-04-03 ENCOUNTER — Encounter: Payer: Self-pay | Admitting: Nurse Practitioner

## 2022-04-03 ENCOUNTER — Ambulatory Visit: Payer: Medicare HMO | Admitting: Nurse Practitioner

## 2022-04-03 VITALS — BP 140/74 | HR 65 | Ht 65.0 in | Wt 231.6 lb

## 2022-04-03 DIAGNOSIS — I10 Essential (primary) hypertension: Secondary | ICD-10-CM

## 2022-04-03 DIAGNOSIS — E038 Other specified hypothyroidism: Secondary | ICD-10-CM | POA: Diagnosis not present

## 2022-04-03 DIAGNOSIS — E1159 Type 2 diabetes mellitus with other circulatory complications: Secondary | ICD-10-CM

## 2022-04-03 DIAGNOSIS — E782 Mixed hyperlipidemia: Secondary | ICD-10-CM

## 2022-04-03 LAB — POCT GLYCOSYLATED HEMOGLOBIN (HGB A1C): Hemoglobin A1C: 8.9 % — AB (ref 4.0–5.6)

## 2022-04-03 MED ORDER — HUMULIN R U-500 KWIKPEN 500 UNIT/ML ~~LOC~~ SOPN: PEN_INJECTOR | SUBCUTANEOUS | 0 refills | Status: DC

## 2022-04-03 NOTE — Progress Notes (Signed)
04/03/2022   Endocrinology follow-up note   Subjective:    Patient ID: Paul Baker, male    DOB: Sep 09, 1950, PCP Kathyrn Drown, MD   Past Medical History:  Diagnosis Date   Arthritis    Asthma    Atrial fibrillation    Diagnosed December 2021   Colon polyp    Coronary atherosclerosis of native coronary artery    a. 2011: cath showing 90% stenosis along small non-dominant RCA (too small for PCI). b. 01/2018: cath showing nonobstructive CAD with 60 to 70% proximal to mid nondominant RCA stenosis, 50% mid LAD and 40 to 50% OM1   DVT (deep venous thrombosis) 2005   Right arm   Essential hypertension    Headache    History of transfusion    Hypothyroidism    Mixed hyperlipidemia    Morbid obesity    MRSA (methicillin resistant staph aureus) culture positive    08/2012   Narcolepsy    OSA (obstructive sleep apnea)    CPAP   Osteoarthritis    Pneumonia    Psoriasis    Pulmonary embolism 2004   RLS (restless legs syndrome)    Rotator cuff disorder    Left   Septic arthritis of knee, left    Skin cancer, basal cell    Type 2 diabetes mellitus    Past Surgical History:  Procedure Laterality Date   BACK SURGERY     BIOPSY  01/31/2022   Procedure: BIOPSY;  Surgeon: Loney Laurence, DO;  Location: [facility];  Service: Gastroenterology;;   CATARACT EXTRACTION Bilateral    COLONOSCOPY     COLONOSCOPY WITH PROPOFOL N/A 01/31/2022   Procedure: COLONOSCOPY WITH PROPOFOL;  Surgeon: Loney Laurence, DO;  Location: [facility];  Service: Gastroenterology;  Laterality: N/A;   CYST REMOVAL TRUNK     from back   ESOPHAGOGASTRODUODENOSCOPY (EGD) WITH PROPOFOL N/A 01/31/2022   Procedure: ESOPHAGOGASTRODUODENOSCOPY (EGD) WITH PROPOFOL;  Surgeon: Loney Laurence, DO;  Location: [facility];  Service: Gastroenterology;  Laterality: N/A;   EYE SURGERY Left 2016   laser to left eye   HIP SURGERY     bone removed from both sides of hip   KNEE ARTHROTOMY Right 12/04/2014    Procedure: KNEE ARTHROTOMY PATELLA LIGAMENT RECONSTRUSION AND REPAIR RIGHT KNEE;  Surgeon: Paralee Cancel, MD;  Location: [place];  Service: Orthopedics;  Laterality: Right;   KNEE SURGERY     X 25 TIMES   LEFT HEART CATH AND CORONARY ANGIOGRAPHY N/A 01/17/2018   Procedure: LEFT HEART CATH AND CORONARY ANGIOGRAPHY;  Surgeon: Belva Crome, MD;  Location: [place] [facility];  Service: Cardiovascular;  Laterality: N/A;   LUMBAR DISC SURGERY     Left L3, L4, L5 discecotomy with decompression of L4 root   POLYPECTOMY  01/31/2022   Procedure: POLYPECTOMY;  Surgeon: Loney Laurence, DO;  Location: [facility];  Service: Gastroenterology;;   TONSILLECTOMY     TOTAL KNEE ARTHROPLASTY  2003   LEFT   TOTAL KNEE ARTHROPLASTY Right 03/23/2014   Procedure: RIGHT TOTAL KNEE ARTHROPLASTY AND REMOVAL RIGHT TIBIAL  DEEP IMPLANT STAPLE;  Surgeon: Paralee Cancel, MD;  Location: [facility];  Service: Orthopedics;  Laterality: Right;   TOTAL KNEE REVISION  2005   LEFT   WRIST SURGERY     Social History   Socioeconomic History   Marital status: Married    Spouse name: Toney Reil   Number of children: 2   Years of education: college  Highest education level: Not on file  Occupational History   Occupation: Disabled    Employer: UNEMPLOYED  Tobacco Use   Smoking status: Former    Packs/day: 1.50    Years: 10.00    Additional pack years: 0.00    Total pack years: 15.00    Types: Cigarettes    Start date: 06/19/1967    Quit date: 01/03/1995    Years since quitting: 27.2   Smokeless tobacco: Never  Vaping Use   Vaping Use: Never used  Substance and Sexual Activity   Alcohol use: No    Alcohol/week: 0.0 standard drinks of alcohol    Comment: quit drinking in 07/86   Drug use: No   Sexual activity: Yes    Partners: Female  Other Topics Concern   Not on file  Social History Narrative    72 year old, right-handed, caucasian male with a past medical history of obesity, hypertension, hyperlipidemia, diabetes,  obstructive sleep apnea, presenting with frequent nighttime awakenings, excessive daytime sleepiness, also transient confusional episodes.RLS and one beosity, OSA on CPAP with AHI of 3.2 and  setting of 16 cm water , [facility] .   Married since 1977.   Social Determinants of Health   Financial Resource Strain: Low Risk  (01/18/2021)   Overall Financial Resource Strain (CARDIA)    Difficulty of Paying Living Expenses: Not hard at all  Food Insecurity: No Food Insecurity (01/18/2021)   Hunger Vital Sign    Worried About Running Out of Food in the Last Year: Never true    Ran Out of Food in the Last Year: Never true  Transportation Needs: No Transportation Needs (01/18/2021)   PRAPARE - Hydrologist (Medical): No    Lack of Transportation (Non-Medical): No  Physical Activity: Sufficiently Active (01/18/2021)   Exercise Vital Sign    Days of Exercise per Week: 3 days    Minutes of Exercise per Session: 60 min  Stress: No Stress Concern Present (01/18/2021)   [place]    Feeling of Stress : Only a little  Social Connections: Socially Integrated (01/18/2021)   Social Connection and Isolation Panel [NHANES]    Frequency of Communication with Friends and Family: More than three times a week    Frequency of Social Gatherings with Friends and Family: More than three times a week    Attends Religious Services: More than 4 times per year    Active Member of [employer] or Organizations: Yes    Attends Archivist Meetings: More than 4 times per year    Marital Status: Married   Outpatient Encounter Medications as of 04/03/2022  Medication Sig   acetaminophen (TYLENOL) 325 MG tablet Take 2 tablets (650 mg total) by mouth every 6 (six) hours as needed for mild pain (or Fever >/= 101).   albuterol (PROVENTIL) (2.5 MG/3ML) 0.083% nebulizer solution Take 3 mLs (2.5 mg total) by nebulization every 6  (six) hours as needed for up to 7 days for wheezing or shortness of breath.   albuterol (VENTOLIN HFA) 108 (90 Base) MCG/ACT inhaler Inhale 2 puffs into the lungs every 6 (six) hours as needed for wheezing or shortness of breath.   alfuzosin (UROXATRAL) 10 MG 24 hr tablet Take 10 mg by mouth daily.   amoxicillin-clavulanate (AUGMENTIN) 875-125 MG tablet Take 1 tablet by mouth 2 (two) times daily.   apixaban (ELIQUIS) 5 MG TABS tablet Take 1 tablet (5 mg total) by  mouth 2 (two) times daily. For stroke prevention   dextromethorphan (DELSYM) 30 MG/5ML liquid Take 30 mg by mouth as needed for cough.   ezetimibe (ZETIA) 10 MG tablet Take 1 tablet by mouth once daily   Fluticasone-Umeclidin-Vilant (TRELEGY ELLIPTA) 100-62.5-25 MCG/ACT AEPB Take 1 puff by mouth daily.   furosemide (LASIX) 40 MG tablet Take 40-80 mg by mouth See admin instructions. Take 80 mg in the morning and 40 mg in the afternoon   Glucagon (GVOKE HYPOPEN 2-PACK) 1 MG/0.2ML SOAJ Inject 1 mg subcutaneously once as needed for low blood sugar. May repeat dose in 15 minutes as needed using a new device.   Insulin Pen Needle (B-D ULTRAFINE III SHORT PEN) 31G X 8 MM MISC USE 1 PEN NEEDLE THREE TIMES DAILY   levothyroxine (SYNTHROID) 137 MCG tablet TAKE 1 TABLET BY MOUTH ONCE DAILY BEFORE BREAKFAST   loratadine (CLARITIN) 10 MG tablet Take 10 mg by mouth daily as needed for allergies.    metoprolol succinate (TOPROL XL) 25 MG 24 hr tablet Take 1 tablet (25 mg total) by mouth daily as needed (for prolonged palpitations).   metoprolol succinate (TOPROL-XL) 100 MG 24 hr tablet Take 1 tablet (100 mg total) by mouth 2 (two) times daily.   modafinil (PROVIGIL) 200 MG tablet Take 1 tablet (200 mg total) by mouth daily.   nitroGLYCERIN (NITROSTAT) 0.4 MG SL tablet DISSOLVE 1 TABLET UNDER TONGUE EVERY 5 MINUTES UP TO 15 MIN FOR CHESTPAIN. IF NO RELIEF CALL 911.   ONE TOUCH ULTRA TEST test strip TEST BLOOD SUGAR UP TO 4 TIMES DAILY.   pravastatin  (PRAVACHOL) 40 MG tablet TAKE 1 TABLET BY MOUTH ONCE DAILY IN THE EVENING   rOPINIRole (REQUIP XL) 4 MG 24 hr tablet Take 1 tablet (4 mg total) by mouth at bedtime.   rOPINIRole (REQUIP) 4 MG tablet Take 0.5 tablets (2 mg total) by mouth 2 (two) times daily.   [DISCONTINUED] insulin regular human CONCENTRATED (HUMULIN R U-500 KWIKPEN) 500 UNIT/ML KwikPen Inject 100 units under skin with breakfast and lunch , inject 75 units under skin with supper. (Patient taking differently: 75-100 Units See admin instructions. Inject 100 units under skin with breakfast and lunch , inject 75 units under skin with supper.)   insulin regular human CONCENTRATED (HUMULIN R U-500 KWIKPEN) 500 UNIT/ML KwikPen Inject 90 units under skin with breakfast and lunch , inject 75 units under skin with supper.   MYRBETRIQ 50 MG TB24 tablet Take 1 tablet (50 mg total) by mouth daily. (Patient not taking: Reported on [id number])   ONETOUCH DELICA LANCETS [id number] MISC USE AS DIRECTED TO TEST BLOOD SUGAR 4 TIMES DAILY.   pantoprazole (PROTONIX) 40 MG tablet Take 40 mg by mouth daily. (Patient not taking: Reported on 04/03/2022)   promethazine-dextromethorphan (PROMETHAZINE-DM) 6.25-15 MG/5ML syrup Take 5 mLs by mouth 4 (four) times daily as needed for cough. (Patient not taking: Reported on 04/03/2022)   Semaglutide, 1 MG/DOSE, 4 MG/3ML SOPN Inject 1 mg as directed once a week. (Patient not taking: Reported on 04/03/2022)   No facility-administered encounter medications on file as of 04/03/2022.   ALLERGIES: Allergies  Allergen Reactions   Tape Rash    Pulls off skin   Farxiga [Dapagliflozin]     Yeast infections, felt sick with it   Cardizem [Diltiazem Hcl]     Edema    Cardura [Doxazosin Mesylate]     Headaches / cramps   Crestor [Rosuvastatin] Other (See Comments)    Leg cramps  Lipitor [Atorvastatin] Other (See Comments)    Leg cramps   Paxil [Paroxetine Hcl]     Unknown reaction    Codeine Rash and Other (See Comments)     Headache    Gabapentin Rash   VACCINATION STATUS: Immunization History  Administered Date(s) Administered   Fluad Quad(high Dose 65+) 11/03/2021   Influenza Split 09/18/2012   Influenza,inj,Quad PF,6+ Mos 10/13/2013, 10/06/2014, 09/11/2018   Influenza-Unspecified 12/01/2015, 11/08/2016, 11/02/2020   PFIZER(Purple Top)SARS-COV-2 Vaccination 03/06/2019, 03/27/2019   PNEUMOCOCCAL CONJUGATE-20 02/24/2022   Pneumococcal Polysaccharide-23 10/02/2004, 10/03/2011   Td 05/24/2006    Diabetes He presents for his follow-up diabetic visit. He has type 2 diabetes mellitus. Onset time: Was diagnosed at approximate age of 57 years. His disease course has been stable. There are no hypoglycemic associated symptoms. Pertinent negatives for hypoglycemia include no confusion, headaches, nervousness/anxiousness, pallor, seizures or tremors. Associated symptoms include fatigue and foot paresthesias. Pertinent negatives for diabetes include no chest pain, no polydipsia, no polyphagia, no polyuria, no weakness and no weight loss. There are no hypoglycemic complications. Symptoms are stable. Diabetic complications include a CVA, heart disease, nephropathy and peripheral neuropathy. (Has had 2 mini strokes ) Risk factors for coronary artery disease include diabetes mellitus, dyslipidemia, male sex, obesity, sedentary lifestyle, tobacco exposure and hypertension. Current diabetic treatment includes intensive insulin program (and Ozempic). He is compliant with treatment most of the time (has been out of Ozempic for over a month). His weight is increasing steadily. He is following a generally healthy diet. When asked about meal planning, he reported none. He has had a previous visit with a dietitian. He never participates in exercise. His home blood glucose trend is fluctuating dramatically. His overall blood glucose range is >200 mg/dl. (He presents today with his CGM showing improving glycemic profile overall.  His POCT A1c  today is 8.9%, unchanged from previous visit.  Analysis of his CGM shows TIR 44%, TAR 55%, TBR 1% with a GMI of 8.2%.  He denies any significant hypoglycemia, does drop close at times.  He notes he is being referred to Dr. Delton Coombes for concerns of myeloma and also notes he has been more SOB recently, has follow up with cardiology coming up as well. ) An ACE inhibitor/angiotensin II receptor blocker is being taken. He sees a podiatrist.Eye exam is current.  Hyperlipidemia This is a chronic problem. The current episode started more than 1 year ago. The problem is uncontrolled. Recent lipid tests were reviewed and are variable. Exacerbating diseases include chronic renal disease, diabetes, hypothyroidism and obesity. Factors aggravating his hyperlipidemia include fatty foods. Pertinent negatives include no chest pain, myalgias or shortness of breath. Current antihyperlipidemic treatment includes statins. The current treatment provides mild improvement of lipids. Compliance problems include adherence to diet and adherence to exercise.  Risk factors for coronary artery disease include dyslipidemia, diabetes mellitus, hypertension, male sex, obesity and a sedentary lifestyle.  Hypertension This is a chronic problem. The current episode started more than 1 year ago. The problem has been resolved since onset. The problem is controlled. Pertinent negatives include no chest pain, headaches, neck pain, palpitations or shortness of breath. Agents associated with hypertension include thyroid hormones. Risk factors for coronary artery disease include diabetes mellitus, dyslipidemia, male gender, obesity, sedentary lifestyle and smoking/tobacco exposure. Past treatments include angiotensin blockers, beta blockers and diuretics. Compliance problems include exercise and diet.  Hypertensive end-organ damage includes kidney disease, CAD/MI and CVA. Identifiable causes of hypertension include chronic renal disease and a thyroid  problem.  Thyroid Problem Presents for follow-up visit. Symptoms include fatigue and leg swelling. Patient reports no anxiety, cold intolerance, constipation, depressed mood, diarrhea, heat intolerance, palpitations, tremors, weight gain or weight loss. The symptoms have been stable. Past treatments include levothyroxine. His past medical history is significant for diabetes and hyperlipidemia.     Review of systems  Constitutional: + steadily increasing,  current Body mass index is 38.54 kg/m. , no fatigue, no subjective hyperthermia, no subjective hypothermia, reports mild memory impairment since mini strokes Eyes: no blurry vision, no xerophthalmia ENT: no sore throat, no nodules palpated in throat, no dysphagia/odynophagia, no hoarseness Cardiovascular: no chest pain, no shortness of breath, no palpitations, + generalized edema Respiratory: no cough, no shortness of breath Gastrointestinal: no nausea/vomiting/diarrhea Musculoskeletal: no muscle/joint aches Skin: no rashes, no hyperemia Neurological: no tremors, no numbness, no tingling, no dizziness, + intermittent numbness/tingling to bilateral feet Psychiatric: no depression, no anxiety    Objective:    BP (!) 140/74 (BP Location: Right Arm, Patient Position: Sitting, Cuff Size: Large) Comment: Recheck - Manuel Cuff  Pulse 65   Ht 5\' 5"  (1.651 m)   Wt 231 lb 9.6 oz (105.1 kg)   BMI 38.54 kg/m   Wt Readings from Last 3 Encounters:  04/03/22 231 lb 9.6 oz (105.1 kg)  02/24/22 229 lb (103.9 kg)  01/31/22 217 lb (98.4 kg)     BP Readings from Last 3 Encounters:  04/03/22 (!) 140/74  02/24/22 127/73  01/31/22 129/74     Physical Exam- Limited  Constitutional:  Body mass index is 38.54 kg/m. , not in acute distress, normal state of mind Eyes:  EOMI, no exophthalmos Musculoskeletal: no gross deformities, strength intact in all four extremities, no gross restriction of joint movements Skin:  no rashes, no  hyperemia Neurological: no tremor with outstretched hands   Diabetic Foot Exam - Simple   No data filed        Latest Ref Rng & Units 02/28/2022   11:04 AM 12/06/2021    1:41 PM 10/17/2021    2:58 PM  CMP  Glucose 70 - 99 mg/dL  221  315   BUN 8 - 27 mg/dL  23  34   Creatinine 0.76 - 1.27 mg/dL  1.27  1.37   Sodium 134 - 144 mmol/L  138  139   Potassium 3.5 - 5.2 mmol/L  4.3  4.2   Chloride 96 - 106 mmol/L  97  98   CO2 20 - 29 mmol/L  25  23   Calcium 8.6 - 10.2 mg/dL  9.9  9.6   Total Protein 6.0 - 8.5 g/dL 6.6   7.0   Total Bilirubin 0.0 - 1.2 mg/dL   <0.2   Alkaline Phos 44 - 121 IU/L   80   AST 0 - 40 IU/L   16   ALT 0 - 44 IU/L   16      Diabetic Labs (most recent): Lab Results  Component Value Date   HGBA1C 8.9 (A) 04/03/2022   HGBA1C 8.9 (A) 12/12/2021   HGBA1C 9.5 (A) 09/09/2021   MICROALBUR 10 06/09/2021   MICROALBUR 9.5 02/06/2019   MICROALBUR 4.6 10/08/2017   Lipid Panel     Component Value Date/Time   CHOL 166 04/05/2021 0819   TRIG 226 (H) 04/05/2021 0819   HDL 55 04/05/2021 0819   CHOLHDL 3.0 04/05/2021 0819   CHOLHDL 3.2 08/05/2019 0334   VLDL 19 08/05/2019 0334   LDLCALC 74 04/05/2021 0819  [place] 118 (H) 02/06/2019 0813     Assessment & Plan:   1) Uncontrolled type 2 diabetes mellitus with circulatory complication, with long-term current use of insulin (HCC)  -His diabetes is complicated by coronary artery disease and patient remains at an extremely high risk for more acute and chronic complications of diabetes which include CAD, CVA, CKD, retinopathy, and neuropathy. These are all discussed in detail with the patient.  He presents today with his CGM showing improving glycemic profile overall.  His POCT A1c today is 8.9%, unchanged from previous visit.  Analysis of his CGM shows TIR 44%, TAR 55%, TBR 1% with a GMI of 8.2%.  He denies any significant hypoglycemia, does drop close at times.  He notes he is being referred to Dr. Delton Coombes  for concerns of myeloma and also notes he has been more SOB recently, has follow up with cardiology coming up as well.     Glucose logs and insulin administration records pertaining to this visit,  to be scanned into patient's records.  Recent labs reviewed.  - Nutritional counseling repeated at each appointment due to patients tendency to fall back in to old habits.  - The patient admits there is a room for improvement in their diet and drink choices. -  Suggestion is made for the patient to avoid simple carbohydrates from their diet including Cakes, Sweet Desserts / Pastries, Ice Cream, Soda (diet and regular), Sweet Tea, Candies, Chips, Cookies, Sweet Pastries, Store Bought Juices, Alcohol in Excess of 1-2 drinks a day, Artificial Sweeteners, Coffee Creamer, and "Sugar-free" Products. This will help patient to have stable blood glucose profile and potentially avoid unintended weight gain.   - I encouraged the patient to switch to unprocessed or minimally processed complex starch and increased protein intake (animal or plant source), fruits, and vegetables.   - Patient is advised to stick to a routine mealtimes to eat 3 meals a day and avoid unnecessary snacks (to snack only to correct hypoglycemia).  - I have approached patient with the following individualized plan to manage diabetes and patient agrees.  -He will continue to need intensive treatment with higher dose of insulin.  He has been better with U500 versus [place] and regular insulin.     -He is advised to lower his Humulin R U500 to 90 units with breakfast and lunch and 75 units with supper if glucose is above 90 and he is eating.  Will restart his Ozempic 1 mg SQ weekly (will order through PAP), I did give him sample 0.5 mg pen today to reintroduce slowly.   -We did discuss potentially adding the Omnipod DASH to help manage his glucose given his variations.    -He is encouraged to continue using his CGM to monitor glucose 4 times  daily, before meals and before bed, and to call the clinic if he has readings less than 70 or greater than 300 for 3 tests in a row.  2) BP/HTN:  His blood pressure is controlled to target for his age.  He is advised to continue Lasix 40 mg po daily, Losartan 50 mg po daily, Norvasc 5 mg po daily, and Metoprolol 75 mg po twice daily.  Will defer changes to cardiology.  3) Lipids/HPL:  His most recent lipid panel from 04/05/21 shows controlled LDL of 74 (improving) and elevated triglycerides of 226.  He is advised to continue Pravastatin 40 mg po daily at bedtime.  Side effects and precautions discussed with him.    4)  Weight/Diet:  His Body mass index is 99991111 kg/m.-complicating his diabetes care.  He is a candidate for modest weight loss.  CDE consult in progress, exercise, and carbohydrates information provided.  5) Hypothyroidism -There are no recent TFTs to review.  He is advised to continue current dose of Levothyroxine at 137 mcg po daily before breakfast.     - We discussed about the correct intake of his thyroid hormone, on empty stomach at fasting, with water, separated by at least 30 minutes from breakfast and other medications,  and separated by more than 4 hours from calcium, iron, multivitamins, acid reflux medications (PPIs). -Patient is made aware of the fact that thyroid hormone replacement is needed for life, dose to be adjusted by periodic monitoring of thyroid function tests.  6) Chronic Care/Health Maintenance: -Patient is on ACEI/ARB and Statin medications and encouraged to continue to follow up with Ophthalmology, Podiatrist at least yearly or according to recommendations, and advised to  stay away from smoking. I have recommended yearly flu vaccine and pneumonia vaccination at least every 5 years; moderate intensity exercise for up to 150 minutes weekly; and  sleep for at least 7 hours a day.  - I advised patient to maintain close follow up with Kathyrn Drown, MD for  primary care needs.     I spent  48  minutes in the care of the patient today including review of labs from [place], Lipids, Thyroid Function, Hematology (current and previous including abstractions from other facilities); face-to-face time discussing  his blood glucose readings/logs, discussing hypoglycemia and hyperglycemia episodes and symptoms, medications doses, his options of short and long term treatment based on the latest standards of care / guidelines;  discussion about incorporating lifestyle medicine;  and documenting the encounter. Risk reduction counseling performed per USPSTF guidelines to reduce obesity and cardiovascular risk factors.     Please refer to Patient Instructions for Blood Glucose Monitoring and Insulin/Medications Dosing Guide"  in media tab for additional information. Please  also refer to " Patient Self Inventory" in the Media  tab for reviewed elements of pertinent patient history.  Paul Baker participated in the discussions, expressed understanding, and voiced agreement with the above plans.  All questions were answered to his satisfaction. he is encouraged to contact clinic should he have any questions or concerns prior to his return visit.   Follow up plan: -Return in about 3 months (around 07/03/2022) for Diabetes F/U with A1c in office, No previsit labs, Bring meter and logs.  Rayetta Pigg, [facility] [address] [place], [place] [address] Phone: [phone] Fax: [phone]  04/03/2022, 12:28 PM

## 2022-04-04 ENCOUNTER — Ambulatory Visit (INDEPENDENT_AMBULATORY_CARE_PROVIDER_SITE_OTHER): Payer: Medicare HMO | Admitting: Family Medicine

## 2022-04-04 VITALS — BP 120/68 | HR 69 | Temp 97.9°F | Ht 65.0 in | Wt 234.0 lb

## 2022-04-04 DIAGNOSIS — I25119 Atherosclerotic heart disease of native coronary artery with unspecified angina pectoris: Secondary | ICD-10-CM

## 2022-04-04 DIAGNOSIS — E1159 Type 2 diabetes mellitus with other circulatory complications: Secondary | ICD-10-CM

## 2022-04-04 DIAGNOSIS — R0609 Other forms of dyspnea: Secondary | ICD-10-CM | POA: Diagnosis not present

## 2022-04-04 DIAGNOSIS — R600 Localized edema: Secondary | ICD-10-CM

## 2022-04-04 DIAGNOSIS — N289 Disorder of kidney and ureter, unspecified: Secondary | ICD-10-CM

## 2022-04-04 NOTE — Progress Notes (Signed)
Subjective:    Patient ID: Paul Baker, male    DOB: 09-16-1950, 72 y.o.   MRN: [id number]  HPI -SOB worsening with activity and fluid retention, has follow up w/ cardiology this week  .On CPAP and a number of inhalers,  DOE (dyspnea on exertion) - Plan: EKG XX123456, Basic Metabolic Panel, Brain natriuretic peptide  Pedal edema - Plan: EKG XX123456, Basic Metabolic Panel, Brain natriuretic peptide  Coronary artery disease involving native coronary artery of native heart with angina pectoris  DM type 2 causing vascular disease Patient relates a lot of shortness of breath with activity especially going up an incline he also states that he does any significantly walking or movement he gets out of breath.  Also relates a lot of swelling in his legs is on Lasix but he is concerned about this because he states that he had his dad died after being on a lot of Lasix of kidney failure. -Leg swelling worsening in last 2 months  -Headaches 4 months located on temples intermittent Her main focus today was more his dyspnea on exertion and feeling fatigued and tired. Review of Systems     Objective:   Physical Exam  General-in no acute distress Eyes-no discharge Lungs-respiratory rate normal, CTA CV-no murmurs,RRR Extremities skin warm dry moderate edema Neuro grossly normal Behavior normal, alert       Assessment & Plan:  1. DOE (dyspnea on exertion) Concerning for the possibility of underlying heart issues.  We will go ahead and adjust Lasix today he states he already has enough at home.  He is currently doing 2 of the 40 mg tablets in the morning and one of them in the afternoon I encouraged him to go ahead and go to 2 in the morning and 2 in the afternoon We will go ahead with lab work His EKG does not show any acute changes In my opinion he will need to up-to-date echo as well as possible CT coronary artery testing to make sure he is not developing more significant  blockages. Currently we have held off on adding any type of long-acting nitroglycerin He sees cardiology this coming Friday we will send them a message regarding this patient - EKG XX123456 - Basic Metabolic Panel - Brain natriuretic peptide  2. Pedal edema Increase the dose of Lasix Patient is fearful of Lasix because he states his dad died of kidney failure. I assured him that as long as his kidney functions are monitored closely while on the medicine he should be safe for him to take as prescribed - EKG XX123456 - Basic Metabolic Panel - Brain natriuretic peptide  3. Coronary artery disease involving native coronary artery of native heart with angina pectoris I am concerned that his shortness of breath with activity is angina equivalent versus a possibility of underlying heart failure.  Check BMP await the result.  He will see cardiology this coming Friday and more than likely have additional testing including echo and possible CTA coronary artery testing  With him being on blood thinner unlikely to be pulmonary embolism Await lab testing.  Cardiology to handle additional testing.  4. DM type 2 causing vascular disease Under the care of endocrinology importance to keep A1c cholesterol blood pressure under good control discussed  Follow-up in 6 months sooner if any problems he is under the care of multiple specialists currently will be seeing endocrinology, cardiology, oncology send  Patient also complaining of headache but unable to cover all of these in  1 visit.  Follow-up visit recommended.  Will send him information regarding headaches.  Questionnairre, log

## 2022-04-05 LAB — BASIC METABOLIC PANEL
BUN/Creatinine Ratio: 31 — ABNORMAL HIGH (ref 10–24)
BUN: 43 mg/dL — ABNORMAL HIGH (ref 8–27)
CO2: 19 mmol/L — ABNORMAL LOW (ref 20–29)
Calcium: 9.4 mg/dL (ref 8.6–10.2)
Chloride: 99 mmol/L (ref 96–106)
Creatinine, Ser: 1.39 mg/dL — ABNORMAL HIGH (ref 0.76–1.27)
Glucose: 317 mg/dL — ABNORMAL HIGH (ref 70–99)
Potassium: 4.4 mmol/L (ref 3.5–5.2)
Sodium: 137 mmol/L (ref 134–144)
eGFR: 55 mL/min/{1.73_m2} — ABNORMAL LOW (ref >59)

## 2022-04-05 LAB — BRAIN NATRIURETIC PEPTIDE: BNP: 44.2 pg/mL (ref 0.0–100.0)

## 2022-04-06 NOTE — Addendum Note (Signed)
Addended by: Dairl Ponder on: 04/06/2022 02:09 PM   Modules accepted: Orders

## 2022-04-07 ENCOUNTER — Ambulatory Visit: Payer: Medicare HMO | Attending: Nurse Practitioner | Admitting: Nurse Practitioner

## 2022-04-07 ENCOUNTER — Encounter: Payer: Self-pay | Admitting: Nurse Practitioner

## 2022-04-07 VITALS — BP 130/76 | HR 68 | Ht 65.0 in | Wt 232.2 lb

## 2022-04-07 DIAGNOSIS — R0609 Other forms of dyspnea: Secondary | ICD-10-CM

## 2022-04-07 DIAGNOSIS — I480 Paroxysmal atrial fibrillation: Secondary | ICD-10-CM | POA: Diagnosis not present

## 2022-04-07 DIAGNOSIS — I779 Disorder of arteries and arterioles, unspecified: Secondary | ICD-10-CM | POA: Diagnosis not present

## 2022-04-07 DIAGNOSIS — Z79899 Other long term (current) drug therapy: Secondary | ICD-10-CM | POA: Diagnosis not present

## 2022-04-07 DIAGNOSIS — I10 Essential (primary) hypertension: Secondary | ICD-10-CM | POA: Diagnosis not present

## 2022-04-07 DIAGNOSIS — R600 Localized edema: Secondary | ICD-10-CM | POA: Diagnosis not present

## 2022-04-07 DIAGNOSIS — R609 Edema, unspecified: Secondary | ICD-10-CM

## 2022-04-07 DIAGNOSIS — I2510 Atherosclerotic heart disease of native coronary artery without angina pectoris: Secondary | ICD-10-CM

## 2022-04-07 MED ORDER — APIXABAN 5 MG PO TABS: 5.0000 mg | ORAL_TABLET | Freq: Two times a day (BID) | ORAL | 1 refills | Status: DC

## 2022-04-07 MED ORDER — METOPROLOL TARTRATE 25 MG PO TABS: 25.0000 mg | ORAL_TABLET | ORAL | 1 refills | Status: DC | PRN

## 2022-04-07 MED ORDER — FUROSEMIDE 40 MG PO TABS: 80.0000 mg | ORAL_TABLET | Freq: Every morning | ORAL | 1 refills | Status: DC

## 2022-04-07 NOTE — Patient Instructions (Addendum)
Medication Instructions:  Your physician has recommended you make the following change in your medication:  STOP metoprolol succinate START metoprolol tartrate 25 mg as needed for palpitations Take lasix 80 mg in the morning and 40 mg in the evening Continue all other medications as directed  Labwork: BMET due in 1 week @ [facility]  Testing/Procedures: Your physician has requested that you have an echocardiogram. Echocardiography is a painless test that uses sound waves to create images of your heart. It provides your doctor with information about the size and shape of your heart and how well your heart's chambers and valves are working. This procedure takes approximately one hour. There are no restrictions for this procedure. Please do NOT wear cologne, perfume, aftershave, or lotions (deodorant is allowed). Please arrive 15 minutes prior to your appointment time.     Your cardiac CT will be scheduled at one of the below locations:   [facility] [address] [place], [place] [address] [phone]  If scheduled at [facility], please arrive at the [facility] and Children's Entrance (Entrance C2) of [facility] 30 minutes prior to test start time. You can use the FREE valet parking offered at entrance C (encouraged to control the heart rate for the test)  Proceed to the [facility] Department (first floor) to check-in and test prep.  All radiology patients and guests should use entrance C2 at [place], accessed from [facility], even though the hospital's physical address listed is [address].     Please follow these instructions carefully (unless otherwise directed):  Hold all erectile dysfunction medications at least 3 days (72 hrs) prior to test. (Ie viagra, cialis, sildenafil, tadalafil, etc) We will administer nitroglycerin during this exam.   On the Night Before the Test: Be sure to Drink plenty of  water. Do not consume any caffeinated/decaffeinated beverages or chocolate 12 hours prior to your test. Do not take any antihistamines 12 hours prior to your test.  On the Day of the Test: Drink plenty of water until 1 hour prior to the test. Do not eat any food 4 hours prior to the test. You may take your regular medications prior to the test.  Take metoprolol (Lopressor) 100 mg (4 of the 25 mg tablets) two hours prior to test. HOLD Furosemide morning of the test.      After the Test: Drink plenty of water. After receiving IV contrast, you may experience a mild flushed feeling. This is normal. On occasion, you may experience a mild rash up to 24 hours after the test. This is not dangerous. If this occurs, you can take Benadryl 25 mg and increase your fluid intake. If you experience trouble breathing, this can be serious. If it is severe call 911 IMMEDIATELY. If it is mild, please call our office. If you take any of these medications: Glipizide/Metformin, Avandament, Glucavance, please do not take 48 hours after completing test unless otherwise instructed.  We will call to schedule your test 2-4 weeks out understanding that some insurance companies will need an authorization prior to the service being performed.   For non-scheduling related questions, please contact the cardiac imaging nurse navigator should you have any questions/concerns: Rockwell Alexandria, Cardiac Imaging Nurse Navigator Larey Brick, Cardiac Imaging Nurse Navigator [facility] Direct Office Dial: [phone]   For scheduling needs, including cancellations and rescheduling, please call [place], [phone].  Follow-Up:  Your physician recommends that you schedule  a follow-up appointment in: 6-8 weeks  Any Other Special Instructions Will Be Listed Below (If Applicable).   If you need a refill on your cardiac medications before your next appointment, please call your pharmacy.

## 2022-04-07 NOTE — Progress Notes (Unsigned)
Office Visit    Patient Name: Paul ElRobert L Popson Date of Encounter: 04/07/2022  PCP:  Babs SciaraLuking, Scott A, MD   [facility]  Cardiologist:  Nona DellSamuel McDowell, MD *** Advanced Practice Provider:  No care team member to display Electrophysiologist:  None  {Press F2 to show EP APP, CHF, sleep or structural heart MD               :[id number]}  { Click here to update then REFRESH NOTE - MD (PCP) or APP (Team Member)  Change PCP Type for MD, Specialty for APP is either Cardiology or Clinical Cardiac Electrophysiology  :[id number]}  Chief Complaint    Paul Baker is a 72 y.o. male with a hx of CAD, carotid artery disease, hypertension, type 2 diabetes, PAF, obesity, and asthma, who presents today for evaluation of dyspnea on exertion at the request of his PCP, Dr. Gerda DissLuking.  Past Medical History    Past Medical History:  Diagnosis Date   Arthritis    Asthma    Atrial fibrillation    Diagnosed December 2021   Colon polyp    Coronary atherosclerosis of native coronary artery    a. 2011: cath showing 90% stenosis along small non-dominant RCA (too small for PCI). b. 01/2018: cath showing nonobstructive CAD with 60 to 70% proximal to mid nondominant RCA stenosis, 50% mid LAD and 40 to 50% OM1   DVT (deep venous thrombosis) 2005   Right arm   Essential hypertension    Headache    History of transfusion    Hypothyroidism    Mixed hyperlipidemia    Morbid obesity    MRSA (methicillin resistant staph aureus) culture positive    08/2012   Narcolepsy    OSA (obstructive sleep apnea)    CPAP   Osteoarthritis    Pneumonia    Psoriasis    Pulmonary embolism 2004   RLS (restless legs syndrome)    Rotator cuff disorder    Left   Septic arthritis of knee, left    Skin cancer, basal cell    Type 2 diabetes mellitus    Past Surgical History:  Procedure Laterality Date   BACK SURGERY     BIOPSY  01/31/2022   Procedure: BIOPSY;  Surgeon: Lynann BolognaVreeland, Claire H, DO;   Location: [facility];  Service: Gastroenterology;;   CATARACT EXTRACTION Bilateral    COLONOSCOPY     COLONOSCOPY WITH PROPOFOL N/A 01/31/2022   Procedure: COLONOSCOPY WITH PROPOFOL;  Surgeon: Lynann BolognaVreeland, Claire H, DO;  Location: [facility];  Service: Gastroenterology;  Laterality: N/A;   CYST REMOVAL TRUNK     from back   ESOPHAGOGASTRODUODENOSCOPY (EGD) WITH PROPOFOL N/A 01/31/2022   Procedure: ESOPHAGOGASTRODUODENOSCOPY (EGD) WITH PROPOFOL;  Surgeon: Lynann BolognaVreeland, Claire H, DO;  Location: [facility];  Service: Gastroenterology;  Laterality: N/A;   EYE SURGERY Left 2016   laser to left eye   HIP SURGERY     bone removed from both sides of hip   KNEE ARTHROTOMY Right 12/04/2014   Procedure: KNEE ARTHROTOMY PATELLA LIGAMENT RECONSTRUSION AND REPAIR RIGHT KNEE;  Surgeon: Durene RomansMatthew Olin, MD;  Location: [facility] OR;  Service: Orthopedics;  Laterality: Right;   KNEE SURGERY     X 25 TIMES   LEFT HEART CATH AND CORONARY ANGIOGRAPHY N/A 01/17/2018   Procedure: LEFT HEART CATH AND CORONARY ANGIOGRAPHY;  Surgeon: Lyn RecordsSmith, Henry W, MD;  Location: [facility] INVAS[facility] CV LAB;  Service: Cardiovascular;  Laterality: N/A;   LUMBAR DISC SURGERY  Left L3, L4, L5 discecotomy with decompression of L4 root   POLYPECTOMY  01/31/2022   Procedure: POLYPECTOMY;  Surgeon: Lynann Bologna, DO;  Location: [facility];  Service: Gastroenterology;;   TONSILLECTOMY     TOTAL KNEE ARTHROPLASTY  2003   LEFT   TOTAL KNEE ARTHROPLASTY Right 03/23/2014   Procedure: RIGHT TOTAL KNEE ARTHROPLASTY AND REMOVAL RIGHT TIBIAL  DEEP IMPLANT STAPLE;  Surgeon: Durene Romans, MD;  Location: [facility];  Service: Orthopedics;  Laterality: Right;   TOTAL KNEE REVISION  2005   LEFT   WRIST SURGERY      Allergies  Allergies  Allergen Reactions   Tape Rash    Pulls off skin   Farxiga [Dapagliflozin]     Yeast infections, felt sick with it   Cardizem [Diltiazem Hcl]     Edema    Cardura [Doxazosin Mesylate]     Headaches / cramps   Crestor  [Rosuvastatin] Other (See Comments)    Leg cramps   Lipitor [Atorvastatin] Other (See Comments)    Leg cramps   Paxil [Paroxetine Hcl]     Unknown reaction    Codeine Rash and Other (See Comments)    Headache    Gabapentin Rash    History of Present Illness    Paul Baker is a 72 y.o. male with a PMH as mentioned above.  Last seen by Dr. Diona Browner on October 05, 2021.  Was overall doing well from a cardiac perspective at the time.  Seen by PCP this week for dyspnea on exertion and pedal edema.  Lasix dosage was adjusted.  Was encouraged to increase Lasix to 2 tablets in the morning and 2 in the afternoon.  BNP level normal.  Labs overall stable, elevated blood sugar level.  Kidney function stable.   Today he presents for follow-up.  He states...     PCP recommended to update echocardiogram, which I am in agreement with.  May also need to do ischemic evaluation  EKGs/Labs/Other Studies Reviewed:   The following studies were reviewed today: ***  EKG:  EKG is *** ordered today.  The ekg ordered today demonstrates ***  Recent Labs: 10/17/2021: ALT 16; Hemoglobin 11.9; Platelets 202 12/06/2021: TSH 1.230 04/04/2022: BNP 44.2; BUN 43; Creatinine, Ser 1.39; Potassium 4.4; Sodium 137  Recent Lipid Panel    Component Value Date/Time   CHOL 166 04/05/2021 0819   TRIG 226 (H) 04/05/2021 0819   HDL 55 04/05/2021 0819   CHOLHDL 3.0 04/05/2021 0819   CHOLHDL 3.2 08/05/2019 0334   VLDL 19 08/05/2019 0334   LDLCALC 74 04/05/2021 0819   LDLCALC 118 (H) 02/06/2019 0813    Risk Assessment/Calculations:  {Does this patient have ATRIAL FIBRILLATION?:[phone]}  Home Medications   No outpatient medications have been marked as taking for the 04/07/22 encounter (Appointment) with Sharlene Dory, NP.     Review of Systems   ***   All other systems reviewed and are otherwise negative except as noted above.  Physical Exam    VS:  There were no vitals taken for this visit. , BMI  There is no height or weight on file to calculate BMI.  Wt Readings from Last 3 Encounters:  04/04/22 234 lb (106.1 kg)  04/03/22 231 lb 9.6 oz (105.1 kg)  02/24/22 229 lb (103.9 kg)     GEN: Well nourished, well developed, in no acute distress. HEENT: normal. Neck: Supple, no JVD, carotid bruits, or masses. Cardiac: ***RRR, no murmurs, rubs, or gallops. No clubbing,  cyanosis, edema.  ***Radials/PT 2+ and equal bilaterally.  Respiratory:  ***Respirations regular and unlabored, clear to auscultation bilaterally. GI: Soft, nontender, nondistended. MS: No deformity or atrophy. Skin: Warm and dry, no rash. Neuro:  Strength and sensation are intact. Psych: Normal affect.  Assessment & Plan    ***  {Are you ordering a CV Procedure (e.g. stress test, cath, DCCV, TEE, etc)?   Press F2        :[id number]}  For this pt, he can go back to normal dosing of Lasix with 80 mg in AM and 40 mg in PM. Please repeat BMET in 1 week for medication monitoring. After that, if kidney function permits, please arrange a Coronary CTA for DOE. Change Metoprolol Succinate 25 mg PRN for palpitations to Lopressor 25 mg daily PRN for palpitations. Therefore, will take 4 tablets (total of 100 mg total) once 2 hours prior to Coronary CTA. Echo for dyspnea on exertion. Please refill Eliquis and send to [facility]. Please write Rx for compression stockings, knee high, 15-20 mmHg. Salty six diet sheet. F/U with me in 6-8 weeks for follow-up.     Disposition: Follow up {follow up:15908} with Nona DellSamuel McDowell, MD or APP.  Signed, Sharlene DoryElizabeth Kinda Pottle, NP 04/07/2022, 7:59 AM [facility]

## 2022-04-09 MED ORDER — METOPROLOL TARTRATE 25 MG PO TABS: 25.0000 mg | ORAL_TABLET | Freq: Two times a day (BID) | ORAL | 1 refills | Status: DC

## 2022-04-10 ENCOUNTER — Telehealth (HOSPITAL_COMMUNITY): Payer: Self-pay | Admitting: *Deleted

## 2022-04-10 NOTE — Telephone Encounter (Signed)
Reaching out to patient to offer assistance regarding upcoming cardiac imaging study; pt verbalizes understanding of appt date/time, parking situation and where to check in, pre-test NPO status and medications ordered, and verified current allergies; name and call back number provided for further questions should they arise  Dorthie Santini RN Navigator Cardiac Imaging [facility] [phone] office [phone] cell  Patient to take 100mg metoprolol tartrate two hours prior to his cardiac CT scan. He is aware to arrive at 11am. 

## 2022-04-11 ENCOUNTER — Ambulatory Visit (HOSPITAL_COMMUNITY)
Admission: RE | Admit: 2022-04-11 | Discharge: 2022-04-11 | Disposition: A | Discharge status: Home / Self Care | Payer: Medicare HMO | Source: Ambulatory Visit | Attending: Nurse Practitioner | Admitting: Nurse Practitioner

## 2022-04-11 ENCOUNTER — Ambulatory Visit (HOSPITAL_BASED_OUTPATIENT_CLINIC_OR_DEPARTMENT_OTHER)
Admission: RE | Admit: 2022-04-11 | Discharge: 2022-04-11 | Disposition: A | Discharge status: Home / Self Care | Payer: Medicare HMO | Source: Ambulatory Visit | Attending: Internal Medicine | Admitting: Internal Medicine

## 2022-04-11 VITALS — BP 116/79 | HR 79

## 2022-04-11 DIAGNOSIS — R931 Abnormal findings on diagnostic imaging of heart and coronary circulation: Secondary | ICD-10-CM | POA: Insufficient documentation

## 2022-04-11 DIAGNOSIS — R0609 Other forms of dyspnea: Secondary | ICD-10-CM

## 2022-04-11 DIAGNOSIS — I2510 Atherosclerotic heart disease of native coronary artery without angina pectoris: Secondary | ICD-10-CM

## 2022-04-11 DIAGNOSIS — I25119 Atherosclerotic heart disease of native coronary artery with unspecified angina pectoris: Secondary | ICD-10-CM | POA: Diagnosis present

## 2022-04-11 MED ORDER — IOHEXOL 350 MG/ML SOLN
95.0000 mL | Freq: Once | INTRAVENOUS | Status: AC | PRN
  Administered 2022-04-11: 95 mL via INTRAVENOUS

## 2022-04-11 MED ORDER — NITROGLYCERIN 0.4 MG SL SUBL
SUBLINGUAL_TABLET | SUBLINGUAL | Status: AC
  Filled 2022-04-11: qty 2

## 2022-04-11 MED ORDER — METOPROLOL TARTRATE 5 MG/5ML IV SOLN
INTRAVENOUS | Status: AC
  Filled 2022-04-11: qty 20

## 2022-04-11 MED ORDER — METOPROLOL TARTRATE 5 MG/5ML IV SOLN
10.0000 mg | INTRAVENOUS | Status: AC | PRN
  Administered 2022-04-11 (×2): 10 mg via INTRAVENOUS

## 2022-04-11 MED ORDER — NITROGLYCERIN 0.4 MG SL SUBL
0.8000 mg | SUBLINGUAL_TABLET | Freq: Once | SUBLINGUAL | Status: AC
  Administered 2022-04-11: 0.8 mg via SUBLINGUAL

## 2022-04-13 ENCOUNTER — Other Ambulatory Visit: Payer: Self-pay | Admitting: Family Medicine

## 2022-04-13 DIAGNOSIS — I10 Essential (primary) hypertension: Secondary | ICD-10-CM | POA: Diagnosis not present

## 2022-04-13 DIAGNOSIS — N289 Disorder of kidney and ureter, unspecified: Secondary | ICD-10-CM | POA: Diagnosis not present

## 2022-04-13 DIAGNOSIS — Z79899 Other long term (current) drug therapy: Secondary | ICD-10-CM | POA: Diagnosis not present

## 2022-04-13 DIAGNOSIS — E782 Mixed hyperlipidemia: Secondary | ICD-10-CM

## 2022-04-14 LAB — BASIC METABOLIC PANEL
BUN/Creatinine Ratio: 33 — ABNORMAL HIGH (ref 10–24)
BUN: 42 mg/dL — ABNORMAL HIGH (ref 8–27)
CO2: 21 mmol/L (ref 20–29)
Calcium: 9.5 mg/dL (ref 8.6–10.2)
Chloride: 97 mmol/L (ref 96–106)
Creatinine, Ser: 1.26 mg/dL (ref 0.76–1.27)
Glucose: 290 mg/dL — ABNORMAL HIGH (ref 70–99)
Potassium: 4.5 mmol/L (ref 3.5–5.2)
Sodium: 134 mmol/L (ref 134–144)
eGFR: 61 mL/min/{1.73_m2} (ref >59)

## 2022-04-14 LAB — MAGNESIUM: Magnesium: 2.1 mg/dL (ref 1.6–2.3)

## 2022-04-15 ENCOUNTER — Other Ambulatory Visit: Payer: Self-pay | Admitting: Pulmonary Disease

## 2022-04-18 ENCOUNTER — Telehealth: Payer: Self-pay | Admitting: Cardiology

## 2022-04-18 DIAGNOSIS — Z79899 Other long term (current) drug therapy: Secondary | ICD-10-CM

## 2022-04-18 NOTE — Telephone Encounter (Signed)
Patient was returning call about his Ct Coronary Morph results

## 2022-04-19 MED ORDER — ISOSORBIDE MONONITRATE ER 30 MG PO TB24: 15.0000 mg | ORAL_TABLET | Freq: Every day | ORAL | 1 refills | Status: DC

## 2022-04-19 NOTE — Telephone Encounter (Signed)
-----   Message from Sharlene Dory, NP sent at 04/17/2022  4:41 PM EDT ----- Results reviewed. Has plaque buildup in 3 main coronary arteries. Most significant is a moderate blockage noted along long anterior descending artery, around 50%. FFR analysis does not show any significant functional blockage though. Recommend starting Imdur 15 mg daily for diagnosis of CAD to help improve symptoms. Also recommend obtaining FLP and LFT in 2 weeks to optimize GDMT.   Thanks!  Sharlene Dory, AGNP-C

## 2022-04-20 NOTE — Progress Notes (Signed)
[facility] [address] [place] [address]   Clinic Day:  04/21/2022  Referring physician: [name], MD  Patient Care Team: [name], MD as PCP - General (Family Medicine) Jonelle Sidle, MD as PCP - Cardiology (Cardiology) Kathryne Hitch, MD (Orthopedic Surgery) Gavin Pound, [facility] (Inactive) (Pharmacist)   ASSESSMENT & PLAN:   Assessment:  1.  Elevated free kappa light chains: - Workup for anemia with SPEP-no M spike, immunofixation-normal, kappa light chains 35.7, lambda light chains 25.3, ratio 1.41. - He has diabetes for 25 years and has tingling in the hands and feet.  2.  Social/family history: - Lives at home with his wife and is independent of ADLs and IADLs.  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  No exposure to chemicals.  Quit smoking in 1997, smoked 1 pack/day for 10 years. - Sister had kidney cancer, mother died of liver cancer.  Father had basal cell skin cancer.  Plan:  1.  Elevated kappa free light chains: - Kappa light chains most likely elevated from mild CKD. - Recommend repeating free light chains and checking for 24-hour urine for urine free light chains, UPEP and immunofixation. - If the 24-hour urine test is unrevealing, no further workup is needed as elevated kappa light chains most likely from CKD. - RTC 2 weeks for follow-up.  2.  Normocytic anemia: - He has mild anemia of several years duration with hemoglobin ranging between 11-12.8. - We will check for nutritional deficiencies.  Will also check stool for occult blood.   Orders Placed This Encounter  Procedures   CBC with Differential    Standing Status:   Future    Number of Occurrences:   1    Standing Expiration Date:   04/21/2023   Lactate dehydrogenase    Standing Status:   Future    Number of Occurrences:   1    Standing Expiration Date:   04/21/2023   Reticulocytes    Standing  Status:   Future    Number of Occurrences:   1    Standing Expiration Date:   04/21/2023   Beta 2 microglobuline, serum    Standing Status:   Future    Number of Occurrences:   1    Standing Expiration Date:   04/21/2023   Ferritin    Standing Status:   Future    Number of Occurrences:   1    Standing Expiration Date:   04/21/2023   Iron and TIBC ([facility] ONLY)    Standing Status:   Future    Number of Occurrences:   1    Standing Expiration Date:   04/21/2023   Vitamin B12    Standing Status:   Future    Number of Occurrences:   1    Standing Expiration Date:   04/21/2023   Folate    Standing Status:   Future    Number of Occurrences:   1    Standing Expiration Date:   04/21/2023   Methylmalonic acid, serum    Standing Status:   Future    Number of Occurrences:   1    Standing Expiration Date:   04/21/2023   Kappa/lambda light chains    Standing Status:   Future    Number of Occurrences:   1    Standing Expiration Date:   04/21/2023   Copper, serum  Standing Status:   Future    Number of Occurrences:   1    Standing Expiration Date:   04/21/2023   24 hr Ur UPEP/UIFE/Light Chains/TP    Standing Status:   Future    Standing Expiration Date:   04/21/2023   Occult blood x 1 card to lab, stool    Standing Status:   Future    Standing Expiration Date:   04/21/2023   Occult blood x 1 card to lab, stool    Standing Status:   Future    Standing Expiration Date:   04/21/2023    Order Specific Question:   Release to patient    Answer:   Immediate   Occult blood x 1 card to lab, stool    Standing Status:   Future    Standing Expiration Date:   04/21/2023    Order Specific Question:   Release to patient    Answer:   Immediate      I,Katie Daubenspeck,acting as a scribe for Doreatha Massed, MD.,have documented all relevant documentation on the behalf of Doreatha Massed, MD,as directed by  Doreatha Massed, MD while in the presence of Doreatha Massed,  MD.   I, Doreatha Massed MD, have reviewed the above documentation for accuracy and completeness, and I agree with the above.   Doreatha Massed, MD   4/19/20245:01 PM  CHIEF COMPLAINT/PURPOSE OF CONSULT:   Diagnosis: elevated KFLC  Current Therapy: Under workup  HISTORY OF PRESENT ILLNESS:   Paul Baker is a 72 y.o. male presenting to clinic today for evaluation of elevated KFLC at the request of Dr. [name].  Today, he states that he is doing well overall. His appetite level is at 100%. His energy level is at 40%.  He has a long-standing history of mild anemia, with his most recent hemoglobin from 10/17/21 was 11.9 and iron panel from 11/02/21 showing iron 31 and ferritin 155. He underwent further work up with SPEP, SFLC, and IFE on 02/28/22 and was found to have an elevated kappa light chain of 35.7. Labs were otherwise WNL.  PAST MEDICAL HISTORY:   Past Medical History: Past Medical History:  Diagnosis Date   Arthritis    Asthma    Atrial fibrillation    Diagnosed December 2021   Colon polyp    Coronary atherosclerosis of native coronary artery    a. 2011: cath showing 90% stenosis along small non-dominant RCA (too small for PCI). b. 01/2018: cath showing nonobstructive CAD with 60 to 70% proximal to mid nondominant RCA stenosis, 50% mid LAD and 40 to 50% OM1   DVT (deep venous thrombosis) 2005   Right arm   Essential hypertension    Headache    History of transfusion    Hypothyroidism    Mixed hyperlipidemia    Morbid obesity    MRSA (methicillin resistant staph aureus) culture positive    08/2012   Narcolepsy    OSA (obstructive sleep apnea)    CPAP   Osteoarthritis    Pneumonia    Psoriasis    Pulmonary embolism 2004   RLS (restless legs syndrome)    Rotator cuff disorder    Left   Septic arthritis of knee, left    Skin cancer, basal cell    Type 2 diabetes mellitus     Surgical History: Past Surgical History:  Procedure Laterality Date   BACK  SURGERY     BIOPSY  01/31/2022   Procedure: BIOPSY;  Surgeon: Lynann Bologna, DO;  Location: [facility];  Service: Gastroenterology;;   CATARACT EXTRACTION Bilateral    COLONOSCOPY     COLONOSCOPY WITH PROPOFOL N/A 01/31/2022   Procedure: COLONOSCOPY WITH PROPOFOL;  Surgeon: Lynann Bologna, DO;  Location: [facility];  Service: Gastroenterology;  Laterality: N/A;   CYST REMOVAL TRUNK     from back   ESOPHAGOGASTRODUODENOSCOPY (EGD) WITH PROPOFOL N/A 01/31/2022   Procedure: ESOPHAGOGASTRODUODENOSCOPY (EGD) WITH PROPOFOL;  Surgeon: Lynann Bologna, DO;  Location: [facility];  Service: Gastroenterology;  Laterality: N/A;   EYE SURGERY Left 2016   laser to left eye   HIP SURGERY     bone removed from both sides of hip   KNEE ARTHROTOMY Right 12/04/2014   Procedure: KNEE ARTHROTOMY PATELLA LIGAMENT RECONSTRUSION AND REPAIR RIGHT KNEE;  Surgeon: Durene Romans, MD;  Location: [facility];  Service: Orthopedics;  Laterality: Right;   KNEE SURGERY     X 25 TIMES   LEFT HEART CATH AND CORONARY ANGIOGRAPHY N/A 01/17/2018   Procedure: LEFT HEART CATH AND CORONARY ANGIOGRAPHY;  Surgeon: Lyn Records, MD;  Location: [facility];  Service: Cardiovascular;  Laterality: N/A;   LUMBAR DISC SURGERY     Left L3, L4, L5 discecotomy with decompression of L4 root   POLYPECTOMY  01/31/2022   Procedure: POLYPECTOMY;  Surgeon: Lynann Bologna, DO;  Location: [facility];  Service: Gastroenterology;;   TONSILLECTOMY     TOTAL KNEE ARTHROPLASTY  2003   LEFT   TOTAL KNEE ARTHROPLASTY Right 03/23/2014   Procedure: RIGHT TOTAL KNEE ARTHROPLASTY AND REMOVAL RIGHT TIBIAL  DEEP IMPLANT STAPLE;  Surgeon: Durene Romans, MD;  Location: [facility];  Service: Orthopedics;  Laterality: Right;   TOTAL KNEE REVISION  2005   LEFT   WRIST SURGERY      Social History: Social History   Socioeconomic History   Marital status: Married    Spouse name: Engineer, materials   Number of children: 2   Years of education: college    Highest education level: Not on file  Occupational History   Occupation: Disabled    Employer: UNEMPLOYED  Tobacco Use   Smoking status: Former    Packs/day: 1.50    Years: 10.00    Additional pack years: 0.00    Total pack years: 15.00    Types: Cigarettes    Start date: 06/19/1967    Quit date: 01/03/1995    Years since quitting: 27.3    Passive exposure: Never   Smokeless tobacco: Never  Vaping Use   Vaping Use: Never used  Substance and Sexual Activity   Alcohol use: No    Alcohol/week: 0.0 standard drinks of alcohol    Comment: quit drinking in 07/86   Drug use: No   Sexual activity: Yes    Partners: Female  Other Topics Concern   Not on file  Social History Narrative    72 year old, right-handed, caucasian male with a past medical history of obesity, hypertension, hyperlipidemia, diabetes, obstructive sleep apnea, presenting with frequent nighttime awakenings, excessive daytime sleepiness, also transient confusional episodes.RLS and one beosity, OSA on CPAP with AHI of 3.2 and  setting of 16 cm water , [facility] .   Married since 1977.   Social Determinants of Health   Financial Resource Strain: Low Risk  (01/18/2021)   Overall Financial Resource Strain (CARDIA)    Difficulty of Paying Living Expenses: Not hard at all  Food Insecurity: No Food Insecurity (01/18/2021)   Hunger Vital Sign    Worried About  Running Out of Food in the Last Year: Never true    Ran Out of Food in the Last Year: Never true  Transportation Needs: No Transportation Needs (01/18/2021)   PRAPARE - Administrator, Civil Service (Medical): No    Lack of Transportation (Non-Medical): No  Physical Activity: Sufficiently Active (01/18/2021)   Exercise Vital Sign    Days of Exercise per Week: 3 days    Minutes of Exercise per Session: 60 min  Stress: No Stress Concern Present (01/18/2021)   [employer] of Occupational Health - Occupational Stress Questionnaire    Feeling of Stress  : Only a little  Social Connections: Socially Integrated (01/18/2021)   Social Connection and Isolation Panel [NHANES]    Frequency of Communication with Friends and Family: More than three times a week    Frequency of Social Gatherings with Friends and Family: More than three times a week    Attends Religious Services: More than 4 times per year    Active Member of [employer] or Organizations: Yes    Attends Engineer, structural: More than 4 times per year    Marital Status: Married  Catering manager Violence: Not At Risk (01/18/2021)   Humiliation, Afraid, Rape, and Kick questionnaire    Fear of Current or Ex-Partner: No    Emotionally Abused: No    Physically Abused: No    Sexually Abused: No    Family History: Family History  Problem Relation Age of Onset   Hypertension Father    Heart attack Father    Kidney Stones Father    Seizures Grandchild    Narcolepsy Grandchild    Diabetes Sister     Current Medications:  Current Outpatient Medications:    acetaminophen (TYLENOL) 325 MG tablet, Take 2 tablets (650 mg total) by mouth every 6 (six) hours as needed for mild pain (or Fever >/= 101)., Disp: 30 tablet, Rfl: 1   albuterol (PROVENTIL) (2.5 MG/3ML) 0.083% nebulizer solution, Take 3 mLs (2.5 mg total) by nebulization every 6 (six) hours as needed for up to 7 days for wheezing or shortness of breath., Disp: 84 mL, Rfl: 0   albuterol (VENTOLIN HFA) 108 (90 Base) MCG/ACT inhaler, Inhale 2 puffs into the lungs every 6 (six) hours as needed for wheezing or shortness of breath., Disp: 18 g, Rfl: 5   alfuzosin (UROXATRAL) 10 MG 24 hr tablet, Take 10 mg by mouth daily., Disp: , Rfl:    apixaban (ELIQUIS) 5 MG TABS tablet, Take 1 tablet (5 mg total) by mouth 2 (two) times daily. For stroke prevention, Disp: 180 tablet, Rfl: 1   ezetimibe (ZETIA) 10 MG tablet, Take 1 tablet by mouth once daily, Disp: 90 tablet, Rfl: 0   furosemide (LASIX) 40 MG tablet, Take 2 tablets (80 mg total) by  mouth in the morning. Take 40 mg in the evening, Disp: 90 tablet, Rfl: 1   Glucagon (GVOKE HYPOPEN 2-PACK) 1 MG/0.2ML SOAJ, Inject 1 mg subcutaneously once as needed for low blood sugar. May repeat dose in 15 minutes as needed using a new device., Disp: 0.4 mL, Rfl: 1   Insulin Pen Needle (B-D ULTRAFINE III SHORT PEN) 31G X 8 MM MISC, USE 1 PEN NEEDLE THREE TIMES DAILY, Disp: 100 each, Rfl: 2   insulin regular human CONCENTRATED (HUMULIN R U-500 KWIKPEN) 500 UNIT/ML KwikPen, Inject 90 units under skin with breakfast and lunch , inject 75 units under skin with supper., Disp: 60 mL, Rfl: 0  isosorbide mononitrate (IMDUR) 30 MG 24 hr tablet, Take 0.5 tablets (15 mg total) by mouth daily., Disp: 45 tablet, Rfl: 1   levothyroxine (SYNTHROID) 137 MCG tablet, TAKE 1 TABLET BY MOUTH ONCE DAILY BEFORE BREAKFAST, Disp: 90 tablet, Rfl: 0   loratadine (CLARITIN) 10 MG tablet, Take 10 mg by mouth daily as needed for allergies. , Disp: , Rfl:    losartan (COZAAR) 50 MG tablet, Take by mouth., Disp: , Rfl:    metoprolol tartrate (LOPRESSOR) 25 MG tablet, Take 1 tablet (25 mg total) by mouth 2 (two) times daily. May take an additional tablet daily as needed for palpitations., Disp: 90 tablet, Rfl: 1   modafinil (PROVIGIL) 200 MG tablet, Take 1 tablet (200 mg total) by mouth daily., Disp: 90 tablet, Rfl: 1   MYRBETRIQ 50 MG TB24 tablet, Take 1 tablet (50 mg total) by mouth daily., Disp: 30 tablet, Rfl: 6   nitroGLYCERIN (NITROSTAT) 0.4 MG SL tablet, DISSOLVE 1 TABLET UNDER TONGUE EVERY 5 MINUTES UP TO 15 MIN FOR CHESTPAIN. IF NO RELIEF CALL 911., Disp: 25 tablet, Rfl: 3   ONE TOUCH ULTRA TEST test strip, TEST BLOOD SUGAR UP TO 4 TIMES DAILY., Disp: 150 each, Rfl: 5   ONETOUCH DELICA LANCETS 33G MISC, USE AS DIRECTED TO TEST BLOOD SUGAR 4 TIMES DAILY., Disp: 150 each, Rfl: 5   pravastatin (PRAVACHOL) 40 MG tablet, TAKE 1 TABLET BY MOUTH ONCE DAILY IN THE EVENING, Disp: 90 tablet, Rfl: 3   rOPINIRole (REQUIP XL) 4 MG  24 hr tablet, Take 1 tablet (4 mg total) by mouth at bedtime., Disp: 90 tablet, Rfl: 3   rOPINIRole (REQUIP) 4 MG tablet, Take 0.5 tablets (2 mg total) by mouth 2 (two) times daily., Disp: 90 tablet, Rfl: 3   Semaglutide, 1 MG/DOSE, 4 MG/3ML SOPN, Inject 1 mg as directed once a week., Disp: 6 mL, Rfl: 3   TRELEGY ELLIPTA 100-62.5-25 MCG/ACT AEPB, INHALE 1 PUFF ONCE DAILY, Disp: 60 each, Rfl: 0   Allergies: Allergies  Allergen Reactions   Tape Rash    Pulls off skin   Farxiga [Dapagliflozin]     Yeast infections, felt sick with it   Cardizem [Diltiazem Hcl]     Edema    Cardura [Doxazosin Mesylate]     Headaches / cramps   Crestor [Rosuvastatin] Other (See Comments)    Leg cramps   Lipitor [Atorvastatin] Other (See Comments)    Leg cramps   Paxil [Paroxetine Hcl]     Unknown reaction    Codeine Rash and Other (See Comments)    Headache    Gabapentin Rash    REVIEW OF SYSTEMS:   Review of Systems  Constitutional:  Negative for chills, fatigue and fever.  HENT:   Negative for lump/mass, mouth sores, nosebleeds, sore throat and trouble swallowing.   Eyes:  Negative for eye problems.  Respiratory:  Positive for cough and shortness of breath.   Cardiovascular:  Positive for palpitations. Negative for chest pain and leg swelling.  Gastrointestinal:  Positive for constipation and diarrhea. Negative for abdominal pain, nausea and vomiting.  Genitourinary:  Negative for bladder incontinence, difficulty urinating, dysuria, frequency, hematuria and nocturia.   Musculoskeletal:  Negative for arthralgias, back pain, flank pain, myalgias and neck pain.  Skin:  Negative for itching and rash.  Neurological:  Positive for dizziness and headaches. Negative for numbness.  Hematological:  Does not bruise/bleed easily.  Psychiatric/Behavioral:  Positive for sleep disturbance. Negative for depression and suicidal ideas. The patient  is not nervous/anxious.   All other systems reviewed and are  negative.    VITALS:   Blood pressure 129/65, pulse 89, temperature 97.9 F (36.6 C), temperature source Oral, resp. rate 18, height  (1.651 m), weight 229 lb 14.4 oz (104.3 kg), SpO2 98 %.  Wt Readings from Last 3 Encounters:  04/21/22 229 lb 14.4 oz (104.3 kg)  04/07/22 232 lb 3.2 oz (105.3 kg)  04/04/22 234 lb (106.1 kg)    Body mass index is 38.26 kg/m.   PHYSICAL EXAM:   Physical Exam Vitals and nursing note reviewed. Exam conducted with a chaperone present.  Constitutional:      Appearance: Normal appearance.  Cardiovascular:     Rate and Rhythm: Normal rate and regular rhythm.     Pulses: Normal pulses.     Heart sounds: Normal heart sounds.  Pulmonary:     Effort: Pulmonary effort is normal.     Breath sounds: Normal breath sounds.  Abdominal:     Palpations: Abdomen is soft. There is no hepatomegaly, splenomegaly or mass.     Tenderness: There is no abdominal tenderness.  Musculoskeletal:     Right lower leg: No edema.     Left lower leg: No edema.  Lymphadenopathy:     Cervical: No cervical adenopathy.     Right cervical: No superficial, deep or posterior cervical adenopathy.    Left cervical: No superficial, deep or posterior cervical adenopathy.     Upper Body:     Right upper body: No supraclavicular or axillary adenopathy.     Left upper body: No supraclavicular or axillary adenopathy.  Neurological:     General: No focal deficit present.     Mental Status: He is alert and oriented to person, place, and time.  Psychiatric:        Mood and Affect: Mood normal.        Behavior: Behavior normal.     LABS:      Latest Ref Rng & Units 04/21/2022   11:15 AM 10/17/2021    2:58 PM 09/01/2021    3:06 PM  CBC  WBC 4.0 - 10.5 K/uL 7.5  7.9  7.8   Hemoglobin 13.0 - 17.0 g/dL 16.1  09.6  04.5   Hematocrit 39.0 - 52.0 % 34.8  35.7  36.1   Platelets 150 - 400 K/uL 210  202  224       Latest Ref Rng & Units 04/13/2022   11:44 AM 04/04/2022   10:12 AM  02/28/2022   11:04 AM  CMP  Glucose 70 - 99 mg/dL 409  811    BUN 8 - 27 mg/dL 42  43    Creatinine 9.14 - 1.27 mg/dL 7.82  9.56    Sodium 213 - 144 mmol/L 134  137    Potassium 3.5 - 5.2 mmol/L 4.5  4.4    Chloride 96 - 106 mmol/L 97  99    CO2 20 - 29 mmol/L 21  19    Calcium 8.6 - 10.2 mg/dL 9.5  9.4    Total Protein 6.0 - 8.5 g/dL   6.6      No results found for: "CEA1", "CEA" / No results found for: "CEA1", "CEA" Lab Results  Component Value Date   PSA1 2.7 09/11/2018   No results found for: "YQM578" No results found for: "ION629"  Lab Results  Component Value Date   ALBUMINELP 3.6 02/28/2022   MSPIKE Not Observed 02/28/2022   Lab  Results  Component Value Date   TIBC 355 04/21/2022   TIBC 308 11/02/2021   TIBC 304 10/18/2017   FERRITIN 61 04/21/2022   FERRITIN 155 11/02/2021   FERRITIN 149 11/12/2020   IRONPCTSAT 15 (L) 04/21/2022   IRONPCTSAT 10 (L) 11/02/2021   IRONPCTSAT 19 10/18/2017   Lab Results  Component Value Date   LDH 181 04/21/2022     STUDIES:   CT CORONARY MORPH W/CTA COR W/SCORE W/CA W/CM &/OR WO/CM  Addendum Date: 04/13/2022   ADDENDUM REPORT: 04/13/2022 10:10 EXAM: OVER-READ INTERPRETATION  CT CHEST The following report is an over-read performed by radiologist Dr. Curly Shores [facility] on 04/13/2022. This over-read does not include interpretation of cardiac or coronary anatomy or pathology. The coronary CTA interpretation by the cardiologist is attached. COMPARISON:  None. FINDINGS: Cardiovascular: Findings discussed in the body of the report. No incidental filling defects in pulmonary arteries to indicate PE. Mediastinum/Nodes: No suspicious adenopathy identified. Imaged mediastinal structures are unremarkable. Lungs/Pleura: Alveolar opacity consistent with consolidation or volume loss in the right base medially. No pleural effusion or pneumothorax. Upper Abdomen: No acute abnormality. Musculoskeletal: No chest wall  abnormality. No acute or significant osseous findings. IMPRESSION: Right lower lobe subsegmental atelectasis or consolidation. Electronically Signed   By: Layla Maw M.D.   On: 04/13/2022 10:10   Result Date: 04/13/2022 CLINICAL DATA:  72 Year-old White Male EXAM: Cardiac/Coronary  CTA TECHNIQUE: The patient was scanned on a [employer]. FINDINGS: Scan was triggered in the descending thoracic aorta. Axial non-contrast 3 mm slices were carried out through the heart. The data set was analyzed on a dedicated work station and scored using the Agatson method. Gantry rotation speed was 250 msecs and collimation was .6 mm. 0.8 mg of sl NTG was given. The 3D data set was reconstructed in 5% intervals of the 67-82 % of the R-R cycle. Diastolic phases were analyzed on a dedicated work station using MPR, MIP and VRT modes. The patient received 95 cc of contrast. Coronary Arteries:  Normal coronary origin.  Left dominance. Coronary Calcium Score: Left main: 0 Left anterior descending artery: 381 Left circumflex artery: 183 Right coronary artery: 5 Ramus intermedius artery: 0 Total: 569 Percentile: 75th for age, sex, and race matched control. RCA is a small non-dominant artery. Minimal non-obstructive calcified plaques (1-24%) in proximal and mid RCA, the tapers to a small vessel. Left main is a large artery that gives rise to LAD, RI, and LCX arteries. There is no significant plaque. LAD is a large vessel that gives rise to three small diagonal vessels. Mild non-obstructive calcified plaques (25-49%) in proximal LAD. Long calcified mid LAD stenosis (~ 50%). LCX is a dominant artery that gives rise to one large OM1 branch, L-PDA and L-PLA. Minimal and mild non-obstructive calcified plaque (25-49% at worst) in mid LCX. Mild non-obstructive calcified plaque (25-49%) in L-PDA. Mild non-obstructive calcified plaque (25-49%) in the OM1. There is a ramus intermedius with no plaque. Other findings: Aorta: Normal  size.  Aortic atherosclerosis.  No dissection. Main Pulmonary Artery: Normal size of the pulmonary artery. Systemic Veins: Normal drainage Aortic Valve:  Tri-leaflet.  No calcifications. Mitral valve: No calcifications. Normal pulmonary vein drainage into the left atrium. Normal left atrial appendage without a thrombus. Interatrial septum with no communications. Left Ventricle: Normal size Left Atrium: Normal size Right Ventricle: Normal size Right Atrium: Normal size Pericardium: Normal thickness Extra-cardiac findings: See attached radiology report for non-cardiac structures. Artifact: Movement artifact IMPRESSION: 1.  Coronary calcium score of 569. This was 75th percentile for age, sex, and race matched control. 2. Normal coronary origin with left dominance. 3. CAD-RADS 3. Moderate stenosis. Consider symptom-guided anti-ischemic pharmacotherapy as well as risk factor modification per guideline directed care. Additional analysis with CT FFR will be submitted. RECOMMENDATIONS: RECOMMENDATIONS The proposed cut-off value of 1,651 AU yielded a 93 % sensitivity and 75 % specificity in grading AS severity in patients with classical low-flow, low-gradient AS. Proposed different cut-off values to define severe AS for men and women as 2,065 AU and 1,274 AU, respectively. The joint European and American recommendations for the assessment of AS consider the aortic valve calcium score as a continuum - a very high calcium score suggests severe AS and a low calcium score suggests severe AS is unlikely. Sunday Shams, et al. 2017 ESC/EACTS Guidelines for the management of valvular heart disease. Eur Heart J 2017;38:2739-91. Coronary artery calcium (CAC) score is a strong predictor of incident coronary heart disease (CHD) and provides predictive information beyond traditional risk factors. CAC scoring is reasonable to use in the decision to withhold, postpone, or initiate statin therapy in intermediate-risk or  selected borderline-risk asymptomatic adults (age 71-75 years and LDL-C >=70 to <190 mg/dL) who do not have diabetes or established atherosclerotic cardiovascular disease (ASCVD).* In intermediate-risk (10-year ASCVD risk >=7.5% to <20%) adults or selected borderline-risk (10-year ASCVD risk >=5% to <7.5%) adults in whom a CAC score is measured for the purpose of making a treatment decision the following recommendations have been made: If CAC = 0, it is reasonable to withhold statin therapy and reassess in 5 to 10 years, as long as higher risk conditions are absent (diabetes mellitus, family history of premature CHD in first degree relatives (males <55 years; females <65 years), cigarette smoking, LDL >=190 mg/dL or other independent risk factors). If CAC is 1 to 99, it is reasonable to initiate statin therapy for patients >=43 years of age. If CAC is >=100 or >=75th percentile, it is reasonable to initiate statin therapy at any age. Cardiology referral should be considered for patients with CAC scores =400 or >=75th percentile. *2018 AHA/ACC/AACVPR/AAPA/ABC/ACPM/ADA/AGS/APhA/ASPC/NLA/PCNA Guideline on the Management of Blood Cholesterol: A Report of the American College of Cardiology/American Heart Association Task Force on Clinical Practice Guidelines. J Am Coll Cardiol. 2019;73(24):3168-3209. Riley Lam, MD Electronically Signed: By: Riley Lam M.D. On: 04/11/2022 15:02   CT CORONARY FFR DATA PREP & FLUID ANALYSIS  Result Date: 04/11/2022 EXAM: CT-FFR ANALYSIS CLINICAL DATA:  Possibly obstructive coronary lesion(s): Mid LAD FINDINGS: CT-FFR analysis was performed on the original cardiac CT angiogram dataset. Diagrammatic representation of the CT-FFR analysis is provided in a separate PDF document in PACS. This dictation was created using the PDF document and an interactive 3D model of the results. 3D model is not available in the EMR/PACS. Normal FFR range is >0.80. 1. Left Main: No  significant functional stenosis. 2. LAD: No significant functional stenosis, CT-FFR 0.96 at mid LAD lesions. 3. LCX: No significant functional stenosis, CT-FFR 0.96 mid LCX. 4. RCA: No significant functional stenosis, CT-FFR 0.94 proximal-mid RCA, not modeled distally (small vessel) 5. RI: No significant functional stenosis. IMPRESSION: 1. CT FFR analysis shows no evidence of significant functional stenosis. Riley Lam MD Electronically Signed   By: Riley Lam M.D.   On: 04/11/2022 15:07

## 2022-04-21 ENCOUNTER — Inpatient Hospital Stay: Payer: Medicare HMO | Attending: Hematology | Admitting: Hematology

## 2022-04-21 ENCOUNTER — Inpatient Hospital Stay: Payer: Medicare HMO

## 2022-04-21 VITALS — BP 129/65 | HR 89 | Temp 97.9°F | Resp 18 | Ht 65.0 in | Wt 229.9 lb

## 2022-04-21 DIAGNOSIS — I10 Essential (primary) hypertension: Secondary | ICD-10-CM | POA: Diagnosis not present

## 2022-04-21 DIAGNOSIS — R768 Other specified abnormal immunological findings in serum: Secondary | ICD-10-CM

## 2022-04-21 DIAGNOSIS — E785 Hyperlipidemia, unspecified: Secondary | ICD-10-CM | POA: Diagnosis not present

## 2022-04-21 DIAGNOSIS — Z79899 Other long term (current) drug therapy: Secondary | ICD-10-CM | POA: Diagnosis not present

## 2022-04-21 DIAGNOSIS — E8581 Light chain (AL) amyloidosis: Secondary | ICD-10-CM | POA: Insufficient documentation

## 2022-04-21 DIAGNOSIS — Z87891 Personal history of nicotine dependence: Secondary | ICD-10-CM | POA: Insufficient documentation

## 2022-04-21 DIAGNOSIS — Z841 Family history of disorders of kidney and ureter: Secondary | ICD-10-CM | POA: Insufficient documentation

## 2022-04-21 DIAGNOSIS — E039 Hypothyroidism, unspecified: Secondary | ICD-10-CM | POA: Diagnosis not present

## 2022-04-21 DIAGNOSIS — Z833 Family history of diabetes mellitus: Secondary | ICD-10-CM | POA: Diagnosis not present

## 2022-04-21 DIAGNOSIS — D649 Anemia, unspecified: Secondary | ICD-10-CM | POA: Diagnosis not present

## 2022-04-21 DIAGNOSIS — Z8249 Family history of ischemic heart disease and other diseases of the circulatory system: Secondary | ICD-10-CM | POA: Diagnosis not present

## 2022-04-21 DIAGNOSIS — R7689 Other specified abnormal immunological findings in serum: Secondary | ICD-10-CM | POA: Insufficient documentation

## 2022-04-21 DIAGNOSIS — Z820 Family history of epilepsy and other diseases of the nervous system: Secondary | ICD-10-CM | POA: Insufficient documentation

## 2022-04-21 LAB — CBC WITH DIFFERENTIAL/PLATELET
Abs Immature Granulocytes: 0.05 10*3/uL (ref 0.00–0.07)
Basophils Absolute: 0.1 10*3/uL (ref 0.0–0.1)
Basophils Relative: 1 %
Eosinophils Absolute: 0.3 10*3/uL (ref 0.0–0.5)
Eosinophils Relative: 4 %
HCT: 34.8 % — ABNORMAL LOW (ref 39.0–52.0)
Hemoglobin: 11.2 g/dL — ABNORMAL LOW (ref 13.0–17.0)
Immature Granulocytes: 1 %
Lymphocytes Relative: 17 %
Lymphs Abs: 1.3 10*3/uL (ref 0.7–4.0)
MCH: 29.1 pg (ref 26.0–34.0)
MCHC: 32.2 g/dL (ref 30.0–36.0)
MCV: 90.4 fL (ref 80.0–100.0)
Monocytes Absolute: 0.8 10*3/uL (ref 0.1–1.0)
Monocytes Relative: 11 %
Neutro Abs: 5 10*3/uL (ref 1.7–7.7)
Neutrophils Relative %: 66 %
Platelets: 210 10*3/uL (ref 150–400)
RBC: 3.85 MIL/uL — ABNORMAL LOW (ref 4.22–5.81)
RDW: 14.2 % (ref 11.5–15.5)
WBC: 7.5 10*3/uL (ref 4.0–10.5)
nRBC: 0 % (ref 0.0–0.2)

## 2022-04-21 LAB — IRON AND TIBC
Iron: 53 ug/dL (ref 45–182)
Saturation Ratios: 15 % — ABNORMAL LOW (ref 17.9–39.5)
TIBC: 355 ug/dL (ref 250–450)
UIBC: 302 ug/dL

## 2022-04-21 LAB — RETICULOCYTES
Immature Retic Fract: 17.1 % — ABNORMAL HIGH (ref 2.3–15.9)
RBC.: 3.76 MIL/uL — ABNORMAL LOW (ref 4.22–5.81)
Retic Count, Absolute: 62.4 10*3/uL (ref 19.0–186.0)
Retic Ct Pct: 1.7 % (ref 0.4–3.1)

## 2022-04-21 LAB — VITAMIN B12: Vitamin B-12: 248 pg/mL (ref 180–914)

## 2022-04-21 LAB — FOLATE: Folate: 17.2 ng/mL (ref >5.9)

## 2022-04-21 LAB — LACTATE DEHYDROGENASE: LDH: 181 U/L (ref 98–192)

## 2022-04-21 LAB — FERRITIN: Ferritin: 61 ng/mL (ref 24–336)

## 2022-04-21 NOTE — Patient Instructions (Signed)
[facility]  Discharge Instructions  You were seen and examined today by Dr. Ellin Saba. Dr. Ellin Saba is a hematologist, meaning that he specializes in blood abnormalities. Dr. Ellin Saba discussed your past medical history, family history of cancers/blood conditions and the events that led to you being here today.  You were referred to Dr. Ellin Saba due to an abnormal protein presence in the blood.  Dr. Ellin Saba has recommended additional labs today for further evaluation. Dr. Ellin Saba has also requested that you collect 24 hours worth of urine for testing.  Follow-up as scheduled.    Thank you for choosing [facility] to provide your oncology and hematology care.   To afford each patient quality time with our provider, please arrive at least 15 minutes before your scheduled appointment time. You may need to reschedule your appointment if you arrive late (10 or more minutes). Arriving late affects you and other patients whose appointments are after yours.  Also, if you miss three or more appointments without notifying the office, you may be dismissed from the clinic at the provider's discretion.    Again, thank you for choosing [facility].  Our hope is that these requests will decrease the amount of time that you wait before being seen by our physicians.   If you have a lab appointment with the Cancer Center - please note that after April 8th, all labs will be drawn in the cancer center.  You do not have to check in or register with the main entrance as you have in the past but will complete your check-in at the cancer center.            _____________________________________________________________  Should you have questions after your visit to [facility], please contact our office at [phone] and follow the prompts.  Our office hours are 8:00 a.m. to 4:30 p.m. Monday - Thursday and 8:00 a.m. to  2:30 p.m. Friday.  Please note that voicemails left after 4:00 p.m. may not be returned until the following business day.  We are closed weekends and all major holidays.  You do have access to a nurse 24-7, just call the main number to the clinic [phone] and do not press any options, hold on the line and a nurse will answer the phone.    For prescription refill requests, have your pharmacy contact our office and allow 72 hours.    Masks are no longer required in the cancer centers. If you would like for your care team to wear a mask while they are taking care of you, please let them know. You may have one support person who is at least 71 years old accompany you for your appointments.

## 2022-04-22 LAB — BETA 2 MICROGLOBULIN, SERUM: Beta-2 Microglobulin: 2.7 mg/L — ABNORMAL HIGH (ref 0.6–2.4)

## 2022-04-24 ENCOUNTER — Other Ambulatory Visit: Payer: Self-pay

## 2022-04-24 DIAGNOSIS — Z79899 Other long term (current) drug therapy: Secondary | ICD-10-CM | POA: Diagnosis not present

## 2022-04-24 DIAGNOSIS — Z841 Family history of disorders of kidney and ureter: Secondary | ICD-10-CM | POA: Diagnosis not present

## 2022-04-24 DIAGNOSIS — D649 Anemia, unspecified: Secondary | ICD-10-CM

## 2022-04-24 DIAGNOSIS — I10 Essential (primary) hypertension: Secondary | ICD-10-CM | POA: Diagnosis not present

## 2022-04-24 DIAGNOSIS — Z8249 Family history of ischemic heart disease and other diseases of the circulatory system: Secondary | ICD-10-CM | POA: Diagnosis not present

## 2022-04-24 DIAGNOSIS — R768 Other specified abnormal immunological findings in serum: Secondary | ICD-10-CM

## 2022-04-24 DIAGNOSIS — Z833 Family history of diabetes mellitus: Secondary | ICD-10-CM | POA: Diagnosis not present

## 2022-04-24 DIAGNOSIS — Z87891 Personal history of nicotine dependence: Secondary | ICD-10-CM | POA: Diagnosis not present

## 2022-04-24 DIAGNOSIS — E8581 Light chain (AL) amyloidosis: Secondary | ICD-10-CM | POA: Diagnosis not present

## 2022-04-24 DIAGNOSIS — Z820 Family history of epilepsy and other diseases of the nervous system: Secondary | ICD-10-CM | POA: Diagnosis not present

## 2022-04-24 DIAGNOSIS — E785 Hyperlipidemia, unspecified: Secondary | ICD-10-CM | POA: Diagnosis not present

## 2022-04-24 DIAGNOSIS — E039 Hypothyroidism, unspecified: Secondary | ICD-10-CM | POA: Diagnosis not present

## 2022-04-24 LAB — COPPER, SERUM: Copper: 83 ug/dL (ref 69–132)

## 2022-04-24 LAB — OCCULT BLOOD X 1 CARD TO LAB, STOOL
Fecal Occult Bld: NEGATIVE
Fecal Occult Bld: NEGATIVE
Fecal Occult Bld: NEGATIVE

## 2022-04-24 LAB — KAPPA/LAMBDA LIGHT CHAINS
Kappa free light chain: 36.5 mg/L — ABNORMAL HIGH (ref 3.3–19.4)
Kappa, lambda light chain ratio: 1.34 (ref 0.26–1.65)
Lambda free light chains: 27.2 mg/L — ABNORMAL HIGH (ref 5.7–26.3)

## 2022-04-25 ENCOUNTER — Telehealth: Payer: Self-pay | Admitting: Family Medicine

## 2022-04-25 DIAGNOSIS — N401 Enlarged prostate with lower urinary tract symptoms: Secondary | ICD-10-CM | POA: Diagnosis not present

## 2022-04-25 NOTE — Telephone Encounter (Signed)
Called patient to schedule Medicare Annual Wellness Visit (AWV). Left message for patient to call back and schedule Medicare Annual Wellness Visit (AWV).  Last date of AWV:  01/18/2021    Please schedule an appointment at any time with Courtney, NHA .  If any questions, please contact me at [phone].  Thank you,  Stephanie,  AMB Clinical Support [facility] AWV Program Direct Dial ??[phone]    

## 2022-04-25 NOTE — Telephone Encounter (Signed)
Contacted [name] to schedule their annual wellness visit. Appointment made for 05/05/2022.   Thank you,  Judeth Cornfield,  AMB Clinical Support [facility] AWV Program Direct Dial ??[phone]

## 2022-04-26 LAB — UPEP/UIFE/LIGHT CHAINS/TP, 24-HR UR
% BETA, Urine: 9.4 %
ALPHA 1 URINE: 3.5 %
Albumin, U: 71 %
Alpha 2, Urine: 4.5 %
Free Kappa Lt Chains,Ur: 12.12 mg/L (ref 1.17–86.46)
Free Kappa/Lambda Ratio: 5.09 (ref 1.83–14.26)
Free Lambda Lt Chains,Ur: 2.38 mg/L (ref 0.27–15.21)
GAMMA GLOBULIN URINE: 11.7 %
Total Protein, Urine-Ur/day: 473 mg/24 hr — ABNORMAL HIGH (ref 30–150)
Total Protein, Urine: 18.9 mg/dL
Total Volume: 2500

## 2022-04-27 LAB — METHYLMALONIC ACID, SERUM: Methylmalonic Acid, Quantitative: 270 nmol/L (ref 0–378)

## 2022-05-02 DIAGNOSIS — R3912 Poor urinary stream: Secondary | ICD-10-CM | POA: Diagnosis not present

## 2022-05-02 DIAGNOSIS — R3914 Feeling of incomplete bladder emptying: Secondary | ICD-10-CM | POA: Diagnosis not present

## 2022-05-03 ENCOUNTER — Ambulatory Visit: Payer: Medicare HMO | Attending: Nurse Practitioner

## 2022-05-03 DIAGNOSIS — I080 Rheumatic disorders of both mitral and aortic valves: Secondary | ICD-10-CM | POA: Diagnosis not present

## 2022-05-03 DIAGNOSIS — R0609 Other forms of dyspnea: Secondary | ICD-10-CM | POA: Diagnosis not present

## 2022-05-03 DIAGNOSIS — I517 Cardiomegaly: Secondary | ICD-10-CM

## 2022-05-03 DIAGNOSIS — I3139 Other pericardial effusion (noninflammatory): Secondary | ICD-10-CM | POA: Diagnosis not present

## 2022-05-03 DIAGNOSIS — I5030 Unspecified diastolic (congestive) heart failure: Secondary | ICD-10-CM | POA: Diagnosis not present

## 2022-05-03 LAB — ECHOCARDIOGRAM COMPLETE
AR max vel: 1.68 cm2
AV Area VTI: 1.66 cm2
AV Area mean vel: 1.64 cm2
AV Mean grad: 7 mmHg
AV Peak grad: 12.3 mmHg
AV Vena cont: 0.3 cm
Ao pk vel: 1.76 m/s
Area-P 1/2: 3.02 cm2
Calc EF: 60.6 %
MV M vel: 1.71 m/s
MV Peak grad: 11.7 mmHg
P 1/2 time: 994 msec
S' Lateral: 2.7 cm
Single Plane A2C EF: 61.4 %
Single Plane A4C EF: 62.5 %

## 2022-05-04 ENCOUNTER — Encounter: Payer: Self-pay | Admitting: Neurology

## 2022-05-04 ENCOUNTER — Inpatient Hospital Stay: Payer: Medicare HMO | Attending: Physician Assistant | Admitting: Physician Assistant

## 2022-05-04 ENCOUNTER — Ambulatory Visit: Payer: Medicare HMO | Admitting: Neurology

## 2022-05-04 VITALS — BP 138/76 | HR 81 | Temp 97.9°F | Resp 16 | Wt 230.0 lb

## 2022-05-04 VITALS — BP 137/69 | HR 88 | Ht 65.0 in | Wt 228.0 lb

## 2022-05-04 DIAGNOSIS — Z833 Family history of diabetes mellitus: Secondary | ICD-10-CM | POA: Insufficient documentation

## 2022-05-04 DIAGNOSIS — Z794 Long term (current) use of insulin: Secondary | ICD-10-CM

## 2022-05-04 DIAGNOSIS — Z8249 Family history of ischemic heart disease and other diseases of the circulatory system: Secondary | ICD-10-CM | POA: Diagnosis not present

## 2022-05-04 DIAGNOSIS — Z87891 Personal history of nicotine dependence: Secondary | ICD-10-CM | POA: Diagnosis not present

## 2022-05-04 DIAGNOSIS — Z841 Family history of disorders of kidney and ureter: Secondary | ICD-10-CM | POA: Diagnosis not present

## 2022-05-04 DIAGNOSIS — Z86711 Personal history of pulmonary embolism: Secondary | ICD-10-CM | POA: Diagnosis not present

## 2022-05-04 DIAGNOSIS — E785 Hyperlipidemia, unspecified: Secondary | ICD-10-CM | POA: Insufficient documentation

## 2022-05-04 DIAGNOSIS — I480 Paroxysmal atrial fibrillation: Secondary | ICD-10-CM

## 2022-05-04 DIAGNOSIS — Z820 Family history of epilepsy and other diseases of the nervous system: Secondary | ICD-10-CM | POA: Insufficient documentation

## 2022-05-04 DIAGNOSIS — R5383 Other fatigue: Secondary | ICD-10-CM | POA: Insufficient documentation

## 2022-05-04 DIAGNOSIS — E8581 Light chain (AL) amyloidosis: Secondary | ICD-10-CM | POA: Insufficient documentation

## 2022-05-04 DIAGNOSIS — D649 Anemia, unspecified: Secondary | ICD-10-CM | POA: Insufficient documentation

## 2022-05-04 DIAGNOSIS — J849 Interstitial pulmonary disease, unspecified: Secondary | ICD-10-CM | POA: Diagnosis not present

## 2022-05-04 DIAGNOSIS — E1142 Type 2 diabetes mellitus with diabetic polyneuropathy: Secondary | ICD-10-CM

## 2022-05-04 DIAGNOSIS — G4733 Obstructive sleep apnea (adult) (pediatric): Secondary | ICD-10-CM

## 2022-05-04 DIAGNOSIS — Z79899 Other long term (current) drug therapy: Secondary | ICD-10-CM | POA: Insufficient documentation

## 2022-05-04 DIAGNOSIS — G2581 Restless legs syndrome: Secondary | ICD-10-CM | POA: Diagnosis not present

## 2022-05-04 DIAGNOSIS — E039 Hypothyroidism, unspecified: Secondary | ICD-10-CM | POA: Insufficient documentation

## 2022-05-04 MED ORDER — CYANOCOBALAMIN 500 MCG PO TABS: 500.0000 ug | ORAL_TABLET | Freq: Every day | ORAL | 3 refills | Status: DC

## 2022-05-04 MED ORDER — FERROUS SULFATE 325 (65 FE) MG PO TBEC: 325.0000 mg | DELAYED_RELEASE_TABLET | Freq: Every day | ORAL | 3 refills | Status: DC

## 2022-05-04 MED ORDER — ALPHA LIPOIC ACID-BIOTIN ER 600-450 MG-MCG PO TBCR: 600.0000 mg | EXTENDED_RELEASE_TABLET | Freq: Every morning | ORAL | Status: AC

## 2022-05-04 NOTE — Progress Notes (Signed)
[facility] [address], [place] [address]   Clinic Day:  05/04/2022  Referring physician: [name], MD  Patient Care Team: [name], MD as PCP - General (Family Medicine) Jonelle Sidle, MD as PCP - Cardiology (Cardiology) Kathryne Hitch, MD (Orthopedic Surgery) Gavin Pound, [facility] (Inactive) (Pharmacist)   CHIEF COMPLAINT/PURPOSE OF CONSULT:   Diagnosis: elevated sFLC, normocytic anemia  HISTORY OF PRESENT ILLNESS:   Paul Baker is a 72 y.o. male returns for a follow up for normocytic anemia and elevated serum free light chains He is accompanied by his wife for this visit.   Today, he reports that he struggles with fatigue which affects his ADLs. His appetite and weight are unchanged. He does have occasional episodes of nausea without vomiting. He denies any abdominal pain and reports periodic loose stools. He denies easy bruising or signs of active bleeding. He does have shortness of breath with minimal exertion. He has neuropathy in his fingertips and feet which is chronic in nature. He denies fevers, chills, sweats, chest pain or cough. He has no other complaints.    PAST MEDICAL HISTORY:   Past Medical History: Past Medical History:  Diagnosis Date   Arthritis    Asthma    Atrial fibrillation [facility])    Diagnosed December 2021   Colon polyp    Coronary atherosclerosis of native coronary artery    a. 2011: cath showing 90% stenosis along small non-dominant RCA (too small for PCI). b. 01/2018: cath showing nonobstructive CAD with 60 to 70% proximal to mid nondominant RCA stenosis, 50% mid LAD and 40 to 50% OM1   DVT (deep venous thrombosis) (HCC) 2005   Right arm   Essential hypertension    Headache    History of transfusion    Hypothyroidism    Mixed hyperlipidemia    Morbid obesity (HCC)    MRSA (methicillin resistant staph aureus) culture positive    08/2012   Narcolepsy    OSA (obstructive sleep apnea)    CPAP    Osteoarthritis    Pneumonia    Psoriasis    Pulmonary embolism (HCC) 2004   RLS (restless legs syndrome)    Rotator cuff disorder    Left   Septic arthritis of knee, left (HCC)    Skin cancer, basal cell    Type 2 diabetes mellitus (HCC)     Surgical History: Past Surgical History:  Procedure Laterality Date   BACK SURGERY     BIOPSY  01/31/2022   Procedure: BIOPSY;  Surgeon: Lynann Bologna, DO;  Location: [facility];  Service: Gastroenterology;;   CATARACT EXTRACTION Bilateral    COLONOSCOPY     COLONOSCOPY WITH PROPOFOL N/A 01/31/2022   Procedure: COLONOSCOPY WITH PROPOFOL;  Surgeon: Lynann Bologna, DO;  Location: [facility];  Service: Gastroenterology;  Laterality: N/A;   CYST REMOVAL TRUNK     from back   ESOPHAGOGASTRODUODENOSCOPY (EGD) WITH PROPOFOL N/A 01/31/2022   Procedure: ESOPHAGOGASTRODUODENOSCOPY (EGD) WITH PROPOFOL;  Surgeon: Lynann Bologna, DO;  Location: [facility];  Service: Gastroenterology;  Laterality: N/A;   EYE SURGERY Left 2016   laser to left eye   HIP SURGERY     bone removed from both sides of hip   KNEE ARTHROTOMY Right 12/04/2014   Procedure: KNEE ARTHROTOMY PATELLA LIGAMENT RECONSTRUSION AND REPAIR RIGHT KNEE;  Surgeon: Durene Romans, MD;  Location: [facility] OR;  Service: Orthopedics;  Laterality: Right;   KNEE SURGERY  X 25 TIMES   LEFT HEART CATH AND CORONARY ANGIOGRAPHY N/A 01/17/2018   Procedure: LEFT HEART CATH AND CORONARY ANGIOGRAPHY;  Surgeon: Lyn Records, MD;  Location: [facility] INVASIVE CV LAB;  Service: Cardiovascular;  Laterality: N/A;   LUMBAR DISC SURGERY     Left L3, L4, L5 discecotomy with decompression of L4 root   POLYPECTOMY  01/31/2022   Procedure: POLYPECTOMY;  Surgeon: Lynann Bologna, DO;  Location: [facility];  Service: Gastroenterology;;   TONSILLECTOMY     TOTAL KNEE ARTHROPLASTY  2003   LEFT   TOTAL KNEE ARTHROPLASTY Right 03/23/2014   Procedure: RIGHT TOTAL KNEE ARTHROPLASTY AND REMOVAL RIGHT TIBIAL  DEEP  IMPLANT STAPLE;  Surgeon: Durene Romans, MD;  Location: [facility];  Service: Orthopedics;  Laterality: Right;   TOTAL KNEE REVISION  2005   LEFT   WRIST SURGERY      Social History: Social History   Socioeconomic History   Marital status: Married    Spouse name: Engineer, materials   Number of children: 2   Years of education: college   Highest education level: Not on file  Occupational History   Occupation: Disabled    Employer: UNEMPLOYED  Tobacco Use   Smoking status: Former    Packs/day: 1.50    Years: 10.00    Additional pack years: 0.00    Total pack years: 15.00    Types: Cigarettes    Start date: 06/19/1967    Quit date: 01/03/1995    Years since quitting: 27.3    Passive exposure: Never   Smokeless tobacco: Never  Vaping Use   Vaping Use: Never used  Substance and Sexual Activity   Alcohol use: No    Alcohol/week: 0.0 standard drinks of alcohol    Comment: quit drinking in 07/86   Drug use: No   Sexual activity: Yes    Partners: Female  Other Topics Concern   Not on file  Social History Narrative    72 year old, right-handed, caucasian male with a past medical history of obesity, hypertension, hyperlipidemia, diabetes, obstructive sleep apnea, presenting with frequent nighttime awakenings, excessive daytime sleepiness, also transient confusional episodes.RLS and one beosity, OSA on CPAP with AHI of 3.2 and  setting of 16 cm water , [facility] .   Married since 1977.   Social Determinants of Health   Financial Resource Strain: Low Risk  (01/18/2021)   Overall Financial Resource Strain (CARDIA)    Difficulty of Paying Living Expenses: Not hard at all  Food Insecurity: No Food Insecurity (01/18/2021)   Hunger Vital Sign    Worried About Running Out of Food in the Last Year: Never true    Ran Out of Food in the Last Year: Never true  Transportation Needs: No Transportation Needs (01/18/2021)   PRAPARE - Administrator, Civil Service (Medical): No    Lack of  Transportation (Non-Medical): No  Physical Activity: Sufficiently Active (01/18/2021)   Exercise Vital Sign    Days of Exercise per Week: 3 days    Minutes of Exercise per Session: 60 min  Stress: No Stress Concern Present (01/18/2021)   [employer] of Occupational Health - Occupational Stress Questionnaire    Feeling of Stress : Only a little  Social Connections: Socially Integrated (01/18/2021)   Social Connection and Isolation Panel [NHANES]    Frequency of Communication with Friends and Family: More than three times a week    Frequency of Social Gatherings with Friends and Family: More than three times  a week    Attends Religious Services: More than 4 times per year    Active Member of Clubs or Organizations: Yes    Attends Banker Meetings: More than 4 times per year    Marital Status: Married  Catering manager Violence: Not At Risk (01/18/2021)   Humiliation, Afraid, Rape, and Kick questionnaire    Fear of Current or Ex-Partner: No    Emotionally Abused: No    Physically Abused: No    Sexually Abused: No    Family History: Family History  Problem Relation Age of Onset   Hypertension Father    Heart attack Father    Kidney Stones Father    Seizures Grandchild    Narcolepsy Grandchild    Diabetes Sister     Current Medications:  Current Outpatient Medications:    acetaminophen (TYLENOL) 325 MG tablet, Take 2 tablets (650 mg total) by mouth every 6 (six) hours as needed for mild pain (or Fever >/= 101)., Disp: 30 tablet, Rfl: 1   albuterol (PROVENTIL) (2.5 MG/3ML) 0.083% nebulizer solution, Take 3 mLs (2.5 mg total) by nebulization every 6 (six) hours as needed for up to 7 days for wheezing or shortness of breath., Disp: 84 mL, Rfl: 0   albuterol (VENTOLIN HFA) 108 (90 Base) MCG/ACT inhaler, Inhale 2 puffs into the lungs every 6 (six) hours as needed for wheezing or shortness of breath., Disp: 18 g, Rfl: 5   alfuzosin (UROXATRAL) 10 MG 24 hr tablet, Take 10  mg by mouth daily., Disp: , Rfl:    apixaban (ELIQUIS) 5 MG TABS tablet, Take 1 tablet (5 mg total) by mouth 2 (two) times daily. For stroke prevention, Disp: 180 tablet, Rfl: 1   ezetimibe (ZETIA) 10 MG tablet, Take 1 tablet by mouth once daily, Disp: 90 tablet, Rfl: 0   furosemide (LASIX) 40 MG tablet, Take 2 tablets (80 mg total) by mouth in the morning. Take 40 mg in the evening, Disp: 90 tablet, Rfl: 1   Glucagon (GVOKE HYPOPEN 2-PACK) 1 MG/0.2ML SOAJ, Inject 1 mg subcutaneously once as needed for low blood sugar. May repeat dose in 15 minutes as needed using a new device., Disp: 0.4 mL, Rfl: 1   Insulin Pen Needle (B-D ULTRAFINE III SHORT PEN) 31G X 8 MM MISC, USE 1 PEN NEEDLE THREE TIMES DAILY, Disp: 100 each, Rfl: 2   insulin regular human CONCENTRATED (HUMULIN R U-500 KWIKPEN) 500 UNIT/ML KwikPen, Inject 90 units under skin with breakfast and lunch , inject 75 units under skin with supper., Disp: 60 mL, Rfl: 0   isosorbide mononitrate (IMDUR) 30 MG 24 hr tablet, Take 0.5 tablets (15 mg total) by mouth daily., Disp: 45 tablet, Rfl: 1   levothyroxine (SYNTHROID) 137 MCG tablet, TAKE 1 TABLET BY MOUTH ONCE DAILY BEFORE BREAKFAST, Disp: 90 tablet, Rfl: 0   loratadine (CLARITIN) 10 MG tablet, Take 10 mg by mouth daily as needed for allergies. , Disp: , Rfl:    losartan (COZAAR) 50 MG tablet, Take by mouth., Disp: , Rfl:    metoprolol tartrate (LOPRESSOR) 25 MG tablet, Take 1 tablet (25 mg total) by mouth 2 (two) times daily. May take an additional tablet daily as needed for palpitations., Disp: 90 tablet, Rfl: 1   modafinil (PROVIGIL) 200 MG tablet, Take 1 tablet (200 mg total) by mouth daily., Disp: 90 tablet, Rfl: 1   MYRBETRIQ 50 MG TB24 tablet, Take 1 tablet (50 mg total) by mouth daily., Disp: 30 tablet, Rfl: 6  nitroGLYCERIN (NITROSTAT) 0.4 MG SL tablet, DISSOLVE 1 TABLET UNDER TONGUE EVERY 5 MINUTES UP TO 15 MIN FOR CHESTPAIN. IF NO RELIEF CALL 911., Disp: 25 tablet, Rfl: 3   ONE TOUCH  ULTRA TEST test strip, TEST BLOOD SUGAR UP TO 4 TIMES DAILY., Disp: 150 each, Rfl: 5   ONETOUCH DELICA LANCETS 33G MISC, USE AS DIRECTED TO TEST BLOOD SUGAR 4 TIMES DAILY., Disp: 150 each, Rfl: 5   pravastatin (PRAVACHOL) 40 MG tablet, TAKE 1 TABLET BY MOUTH ONCE DAILY IN THE EVENING, Disp: 90 tablet, Rfl: 3   rOPINIRole (REQUIP XL) 4 MG 24 hr tablet, Take 1 tablet (4 mg total) by mouth at bedtime., Disp: 90 tablet, Rfl: 3   rOPINIRole (REQUIP) 4 MG tablet, Take 0.5 tablets (2 mg total) by mouth 2 (two) times daily., Disp: 90 tablet, Rfl: 3   Semaglutide, 1 MG/DOSE, 4 MG/3ML SOPN, Inject 1 mg as directed once a week., Disp: 6 mL, Rfl: 3   TRELEGY ELLIPTA 100-62.5-25 MCG/ACT AEPB, INHALE 1 PUFF ONCE DAILY, Disp: 60 each, Rfl: 0   Allergies: Allergies  Allergen Reactions   Tape Rash    Pulls off skin   Farxiga [Dapagliflozin]     Yeast infections, felt sick with it   Cardizem [Diltiazem Hcl]     Edema    Cardura [Doxazosin Mesylate]     Headaches / cramps   Crestor [Rosuvastatin] Other (See Comments)    Leg cramps   Lipitor [Atorvastatin] Other (See Comments)    Leg cramps   Paxil [Paroxetine Hcl]     Unknown reaction    Codeine Rash and Other (See Comments)    Headache    Gabapentin Rash    REVIEW OF SYSTEMS:   Review of Systems  Constitutional:  Positive for fatigue. Negative for chills and fever.  HENT:   Negative for lump/mass, mouth sores, nosebleeds, sore throat and trouble swallowing.   Eyes:  Negative for eye problems.  Respiratory:  Positive for shortness of breath. Negative for cough.   Cardiovascular:  Negative for chest pain, leg swelling and palpitations.  Gastrointestinal:  Positive for diarrhea and nausea. Negative for abdominal pain, blood in stool, constipation and vomiting.  Genitourinary:  Negative for bladder incontinence, difficulty urinating, dysuria, frequency, hematuria and nocturia.   Musculoskeletal:  Negative for arthralgias, back pain, flank pain,  myalgias and neck pain.  Skin:  Negative for itching and rash.  Neurological:  Positive for dizziness. Negative for headaches and numbness.  Hematological:  Does not bruise/bleed easily.  Psychiatric/Behavioral:  Negative for depression, sleep disturbance and suicidal ideas. The patient is not nervous/anxious.   All other systems reviewed and are negative.    VITALS:   There were no vitals taken for this visit.  Wt Readings from Last 3 Encounters:  04/21/22 229 lb 14.4 oz (104.3 kg)  04/07/22 232 lb 3.2 oz (105.3 kg)  04/04/22 234 lb (106.1 kg)    There is no height or weight on file to calculate BMI.   PHYSICAL EXAM:   Physical Exam Vitals reviewed.  Constitutional:      Appearance: Normal appearance.  HENT:     Head: Normocephalic and atraumatic.  Cardiovascular:     Rate and Rhythm: Normal rate and regular rhythm.     Heart sounds: Normal heart sounds.  Pulmonary:     Effort: Pulmonary effort is normal.     Breath sounds: Normal breath sounds.  Abdominal:     Palpations: There is no hepatomegaly or splenomegaly.  Musculoskeletal:     Right lower leg: No edema.     Left lower leg: No edema.  Neurological:     General: No focal deficit present.     Mental Status: He is alert and oriented to person, place, and time.  Psychiatric:        Mood and Affect: Mood normal.        Behavior: Behavior normal.     LABS:      Latest Ref Rng & Units 04/21/2022   11:15 AM 10/17/2021    2:58 PM 09/01/2021    3:06 PM  CBC  WBC 4.0 - 10.5 K/uL 7.5  7.9  7.8   Hemoglobin 13.0 - 17.0 g/dL 14.7  82.9  56.2   Hematocrit 39.0 - 52.0 % 34.8  35.7  36.1   Platelets 150 - 400 K/uL 210  202  224       Latest Ref Rng & Units 04/13/2022   11:44 AM 04/04/2022   10:12 AM 02/28/2022   11:04 AM  CMP  Glucose 70 - 99 mg/dL 130  865    BUN 8 - 27 mg/dL 42  43    Creatinine 7.84 - 1.27 mg/dL 6.96  2.95    Sodium 284 - 144 mmol/L 134  137    Potassium 3.5 - 5.2 mmol/L 4.5  4.4     Chloride 96 - 106 mmol/L 97  99    CO2 20 - 29 mmol/L 21  19    Calcium 8.6 - 10.2 mg/dL 9.5  9.4    Total Protein 6.0 - 8.5 g/dL   6.6      No results found for: "CEA1", "CEA" / No results found for: "CEA1", "CEA" Lab Results  Component Value Date   PSA1 2.7 09/11/2018   No results found for: "XLK440" No results found for: "CAN125"  Lab Results  Component Value Date   ALBUMINELP 3.6 02/28/2022   MSPIKE Not Observed 02/28/2022   Lab Results  Component Value Date   TIBC 355 04/21/2022   TIBC 308 11/02/2021   TIBC 304 10/18/2017   FERRITIN 61 04/21/2022   FERRITIN 155 11/02/2021   FERRITIN 149 11/12/2020   IRONPCTSAT 15 (L) 04/21/2022   IRONPCTSAT 10 (L) 11/02/2021   IRONPCTSAT 19 10/18/2017   Lab Results  Component Value Date   LDH 181 04/21/2022     STUDIES:   ECHOCARDIOGRAM COMPLETE  Result Date: 05/03/2022    ECHOCARDIOGRAM REPORT   Patient Name:   Paul Baker Date of Exam: 05/03/2022 Medical Rec #:  [id number]          Height:       65.0 in Accession #:    [id number]         Weight:       229.9 lb Date of Birth:  10/30/1950          BSA:          2.099 m Patient Age:    71 years           BP:           130/76 mmHg Patient Gender: M                  HR:           76 bpm. Exam Location:  [facility] Procedure: 2D Echo, 3D Echo, Cardiac Doppler, Color Doppler and Strain Analysis Indications:    R06.9 DOE  History:  Patient has prior history of Echocardiogram examinations, most                 recent 08/05/2019. CAD, Arrythmias:Atrial Fibrillation,                 Signs/Symptoms:Dyspnea and Edema; Risk Factors:Former Smoker,                 Hypertension, Diabetes and Dyslipidemia.  Sonographer:    Jake Seats RDMS, RVT, RDCS Referring Phys: [id number] ELIZABETH PECK IMPRESSIONS  1. Left ventricular ejection fraction, by estimation, is 60 to 65%. The left ventricle has normal function. The left ventricle has no regional wall motion abnormalities. There is mild left  ventricular hypertrophy. Left ventricular diastolic parameters are consistent with Grade I diastolic dysfunction (impaired relaxation). The average left ventricular global longitudinal strain is -20.4 %. The global longitudinal strain is normal.  2. Right ventricular systolic function is normal. The right ventricular size is normal. Tricuspid regurgitation signal is inadequate for assessing PA pressure.  3. Left atrial size was mildly dilated.  4. A small pericardial effusion is present. The pericardial effusion is posterior to the left ventricle.  5. The mitral valve is grossly normal. No evidence of mitral valve regurgitation.  6. The aortic valve is tricuspid. Aortic valve regurgitation is trivial. Aortic valve sclerosis is present, with no evidence of aortic valve stenosis. Aortic valve mean gradient measures 7.0 mmHg.  7. The inferior vena cava is normal in size with greater than 50% respiratory variability, suggesting right atrial pressure of 3 mmHg. Comparison(s): Prior images reviewed side by side. LVEF remains normal range at 60-65%. FINDINGS  Left Ventricle: Left ventricular ejection fraction, by estimation, is 60 to 65%. The left ventricle has normal function. The left ventricle has no regional wall motion abnormalities. The average left ventricular global longitudinal strain is -20.4 %. The global longitudinal strain is normal. The left ventricular internal cavity size was normal in size. There is mild left ventricular hypertrophy. Left ventricular diastolic parameters are consistent with Grade I diastolic dysfunction (impaired relaxation). Right Ventricle: The right ventricular size is normal. No increase in right ventricular wall thickness. Right ventricular systolic function is normal. Tricuspid regurgitation signal is inadequate for assessing PA pressure. Left Atrium: Left atrial size was mildly dilated. Right Atrium: Right atrial size was normal in size. Pericardium: A small pericardial effusion is  present. The pericardial effusion is posterior to the left ventricle. Presence of epicardial fat layer. Mitral Valve: The mitral valve is grossly normal. Mild mitral annular calcification. No evidence of mitral valve regurgitation. Tricuspid Valve: The tricuspid valve is grossly normal. Tricuspid valve regurgitation is trivial. Aortic Valve: The aortic valve is tricuspid. There is mild aortic valve annular calcification. Aortic valve regurgitation is trivial. Aortic regurgitation PHT measures 994 msec. Aortic valve sclerosis is present, with no evidence of aortic valve stenosis. Aortic valve mean gradient measures 7.0 mmHg. Aortic valve peak gradient measures 12.3 mmHg. Aortic valve area, by VTI measures 1.66 cm. Pulmonic Valve: The pulmonic valve was grossly normal. Pulmonic valve regurgitation is trivial. Aorta: The aortic root is normal in size and structure. Venous: The inferior vena cava is normal in size with greater than 50% respiratory variability, suggesting right atrial pressure of 3 mmHg. IAS/Shunts: No atrial level shunt detected by color flow Doppler.  LEFT VENTRICLE PLAX 2D LVIDd:         5.00 cm     Diastology LVIDs:         2.70 cm  LV e' medial:    5.66 cm/s LV PW:         1.30 cm     LV E/e' medial:  15.7 LV IVS:        0.70 cm     LV e' lateral:   4.90 cm/s LVOT diam:     1.80 cm     LV E/e' lateral: 18.1 LV SV:         65 LV SV Index:   31          2D Longitudinal Strain LVOT Area:     2.54 cm    2D Strain GLS (A2C):   -20.9 %                            2D Strain GLS (A3C):   -20.2 %                            2D Strain GLS (A4C):   -20.0 % LV Volumes (MOD)           2D Strain GLS Avg:     -20.4 % LV vol d, MOD A2C: 69.4 ml LV vol d, MOD A4C: 77.4 ml LV vol s, MOD A2C: 26.8 ml LV vol s, MOD A4C: 29.0 ml 3D Volume EF: LV SV MOD A2C:     42.6 ml 3D EF:        56 % LV SV MOD A4C:     77.4 ml LV EDV:       165 ml LV SV MOD BP:      45.1 ml LV ESV:       72 ml                            LV SV:         92 ml RIGHT VENTRICLE RV S prime:     12.90 cm/s  PULMONARY VEINS TAPSE (M-mode): 2.2 cm      A Reversal Velocity: 31.90 cm/s                             Diastolic Velocity:  50.10 cm/s                             S/D Velocity:        1.80                             Systolic Velocity:   89.10 cm/s LEFT ATRIUM             Index        RIGHT ATRIUM           Index LA diam:        3.70 cm 1.76 cm/m   RA Area:     12.80 cm LA Vol (A2C):   76.1 ml 36.26 ml/m  RA Volume:   31.70 ml  15.10 ml/m LA Vol (A4C):   65.1 ml 31.02 ml/m LA Biplane Vol: 70.0 ml 33.35 ml/m  AORTIC VALVE AV Area (Vmax):    1.68 cm AV Area (Vmean):   1.64 cm AV Area (VTI):     1.66 cm AV Vmax:  175.50 cm/s AV Vmean:          126.000 cm/s AV VTI:            0.395 m AV Peak Grad:      12.3 mmHg AV Mean Grad:      7.0 mmHg LVOT Vmax:         116.00 cm/s LVOT Vmean:        81.200 cm/s LVOT VTI:          0.257 m LVOT/AV VTI ratio: 0.65 AI PHT:            994 msec AR Vena Contracta: 0.30 cm  AORTA Ao Root diam: 3.00 cm Ao Asc diam:  3.20 cm MITRAL VALVE MV Area (PHT): 3.02 cm     SHUNTS MV Decel Time: 251 msec     Systemic VTI:  0.26 m MR Peak grad: 11.7 mmHg     Systemic Diam: 1.80 cm MR Vmax:      171.00 cm/s MV E velocity: 88.70 cm/s MV A velocity: 116.00 cm/s MV E/A ratio:  0.76 Nona Dell MD Electronically signed by Nona Dell MD Signature Date/Time: 05/03/2022/12:20:38 PM    Final    CT CORONARY MORPH W/CTA COR W/SCORE Vallarie Mare W/CM &/OR WO/CM  Addendum Date: 04/13/2022   ADDENDUM REPORT: 04/13/2022 10:10 EXAM: OVER-READ INTERPRETATION  CT CHEST The following report is an over-read performed by radiologist Dr. Curly Shores [facility] on 04/13/2022. This over-read does not include interpretation of cardiac or coronary anatomy or pathology. The coronary CTA interpretation by the cardiologist is attached. COMPARISON:  None. FINDINGS: Cardiovascular: Findings discussed in the body of the report. No  incidental filling defects in pulmonary arteries to indicate PE. Mediastinum/Nodes: No suspicious adenopathy identified. Imaged mediastinal structures are unremarkable. Lungs/Pleura: Alveolar opacity consistent with consolidation or volume loss in the right base medially. No pleural effusion or pneumothorax. Upper Abdomen: No acute abnormality. Musculoskeletal: No chest wall abnormality. No acute or significant osseous findings. IMPRESSION: Right lower lobe subsegmental atelectasis or consolidation. Electronically Signed   By: Layla Maw M.D.   On: 04/13/2022 10:10   Result Date: 04/13/2022 CLINICAL DATA:  72 Year-old White Male EXAM: Cardiac/Coronary  CTA TECHNIQUE: The patient was scanned on a [employer]. FINDINGS: Scan was triggered in the descending thoracic aorta. Axial non-contrast 3 mm slices were carried out through the heart. The data set was analyzed on a dedicated work station and scored using the Agatson method. Gantry rotation speed was 250 msecs and collimation was .6 mm. 0.8 mg of sl NTG was given. The 3D data set was reconstructed in 5% intervals of the 67-82 % of the R-R cycle. Diastolic phases were analyzed on a dedicated work station using MPR, MIP and VRT modes. The patient received 95 cc of contrast. Coronary Arteries:  Normal coronary origin.  Left dominance. Coronary Calcium Score: Left main: 0 Left anterior descending artery: 381 Left circumflex artery: 183 Right coronary artery: 5 Ramus intermedius artery: 0 Total: 569 Percentile: 75th for age, sex, and race matched control. RCA is a small non-dominant artery. Minimal non-obstructive calcified plaques (1-24%) in proximal and mid RCA, the tapers to a small vessel. Left main is a large artery that gives rise to LAD, RI, and LCX arteries. There is no significant plaque. LAD is a large vessel that gives rise to three small diagonal vessels. Mild non-obstructive calcified plaques (25-49%) in proximal LAD. Long calcified mid  LAD stenosis (~ 50%). LCX is a dominant artery  that gives rise to one large OM1 branch, L-PDA and L-PLA. Minimal and mild non-obstructive calcified plaque (25-49% at worst) in mid LCX. Mild non-obstructive calcified plaque (25-49%) in L-PDA. Mild non-obstructive calcified plaque (25-49%) in the OM1. There is a ramus intermedius with no plaque. Other findings: Aorta: Normal size.  Aortic atherosclerosis.  No dissection. Main Pulmonary Artery: Normal size of the pulmonary artery. Systemic Veins: Normal drainage Aortic Valve:  Tri-leaflet.  No calcifications. Mitral valve: No calcifications. Normal pulmonary vein drainage into the left atrium. Normal left atrial appendage without a thrombus. Interatrial septum with no communications. Left Ventricle: Normal size Left Atrium: Normal size Right Ventricle: Normal size Right Atrium: Normal size Pericardium: Normal thickness Extra-cardiac findings: See attached radiology report for non-cardiac structures. Artifact: Movement artifact IMPRESSION: 1. Coronary calcium score of 569. This was 75th percentile for age, sex, and race matched control. 2. Normal coronary origin with left dominance. 3. CAD-RADS 3. Moderate stenosis. Consider symptom-guided anti-ischemic pharmacotherapy as well as risk factor modification per guideline directed care. Additional analysis with CT FFR will be submitted. RECOMMENDATIONS: RECOMMENDATIONS The proposed cut-off value of 1,651 AU yielded a 93 % sensitivity and 75 % specificity in grading AS severity in patients with classical low-flow, low-gradient AS. Proposed different cut-off values to define severe AS for men and women as 2,065 AU and 1,274 AU, respectively. The joint European and American recommendations for the assessment of AS consider the aortic valve calcium score as a continuum - a very high calcium score suggests severe AS and a low calcium score suggests severe AS is unlikely. Sunday Shams, et al. 2017 ESC/EACTS  Guidelines for the management of valvular heart disease. Eur Heart J 2017;38:2739-91. Coronary artery calcium (CAC) score is a strong predictor of incident coronary heart disease (CHD) and provides predictive information beyond traditional risk factors. CAC scoring is reasonable to use in the decision to withhold, postpone, or initiate statin therapy in intermediate-risk or selected borderline-risk asymptomatic adults (age 66-75 years and LDL-C >=70 to <190 mg/dL) who do not have diabetes or established atherosclerotic cardiovascular disease (ASCVD).* In intermediate-risk (10-year ASCVD risk >=7.5% to <20%) adults or selected borderline-risk (10-year ASCVD risk >=5% to <7.5%) adults in whom a CAC score is measured for the purpose of making a treatment decision the following recommendations have been made: If CAC = 0, it is reasonable to withhold statin therapy and reassess in 5 to 10 years, as long as higher risk conditions are absent (diabetes mellitus, family history of premature CHD in first degree relatives (males <55 years; females <65 years), cigarette smoking, LDL >=190 mg/dL or other independent risk factors). If CAC is 1 to 99, it is reasonable to initiate statin therapy for patients >=74 years of age. If CAC is >=100 or >=75th percentile, it is reasonable to initiate statin therapy at any age. Cardiology referral should be considered for patients with CAC scores =400 or >=75th percentile. *2018 AHA/ACC/AACVPR/AAPA/ABC/ACPM/ADA/AGS/APhA/ASPC/NLA/PCNA Guideline on the Management of Blood Cholesterol: A Report of the American College of Cardiology/American Heart Association Task Force on Clinical Practice Guidelines. J Am Coll Cardiol. 2019;73(24):3168-3209. Riley Lam, MD Electronically Signed: By: Riley Lam M.D. On: 04/11/2022 15:02   CT CORONARY FFR DATA PREP & FLUID ANALYSIS  Result Date: 04/11/2022 EXAM: CT-FFR ANALYSIS CLINICAL DATA:  Possibly obstructive coronary lesion(s):  Mid LAD FINDINGS: CT-FFR analysis was performed on the original cardiac CT angiogram dataset. Diagrammatic representation of the CT-FFR analysis is provided in a separate PDF document in  PACS. This dictation was created using the PDF document and an interactive 3D model of the results. 3D model is not available in the EMR/PACS. Normal FFR range is >0.80. 1. Left Main: No significant functional stenosis. 2. LAD: No significant functional stenosis, CT-FFR 0.96 at mid LAD lesions. 3. LCX: No significant functional stenosis, CT-FFR 0.96 mid LCX. 4. RCA: No significant functional stenosis, CT-FFR 0.94 proximal-mid RCA, not modeled distally (small vessel) 5. RI: No significant functional stenosis. IMPRESSION: 1. CT FFR analysis shows no evidence of significant functional stenosis. Riley Lam MD Electronically Signed   By: Riley Lam M.D.   On: 04/11/2022 15:07     ASSESSMENT & PLAN:   Assessment:  1.  Elevated free kappa light chains: - Workup for anemia with SPEP-no M spike, immunofixation-normal, kappa light chains 35.7, lambda light chains 25.3, ratio 1.41. - He has diabetes for 25 years and has tingling in the hands and feet.  2.  Social/family history: - Lives at home with his wife and is independent of ADLs and IADLs.  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  No exposure to chemicals.  Quit smoking in 1997, smoked 1 pack/day for 10 years. - Sister had kidney cancer, mother died of liver cancer.  Father had basal cell skin cancer.  Plan:  1.  Elevated serum free light chains: - Kappa light chains most likely elevated from mild CKD. - Labs from 04/21/2022 showed elevation of light chains (Kappa 36.5, Lambda 27.2) with normal ratio. 24 hour UPEP did not reveal a monoclonal protein.  -- Suspect cause of elevated sFLC is from inflammation versus kidney dysfunction. No further workup is required at this time.    2.  Normocytic  anemia: - Labs from 04/21/2022 showed anemia with Hgb of 11.2, MCV 90.4. Mild iron deficiency with saturation 15%. Borderline normal vitamin B12 level measuring 248. No evidence of hemolysis, copper deficiency. Normal stool occult.  --Recently underwent EGD/colonoscopy on 01/31/2022 that did not show any evidence of bleeding.  --Recommend to start with ferrous sulfate 325 mg once a day with a source of vitamin and vitamin B12 supplement 500 mcg daily.  --RTC in 3 months with labs.    Patient expressed understanding of the plan provided.   I have spent a total of 30 minutes minutes of face-to-face and non-face-to-face time, preparing to see the patient, performing a medically appropriate examination, counseling and educating the patient, ordering tests/procedures, documenting clinical information in the electronic health record, independently interpreting results and communicating results to the patient, and care coordination.   Georga Kaufmann PA-C Dept of Hematology and Oncology [facility]

## 2022-05-04 NOTE — Progress Notes (Signed)
Provider:  Melvyn Novas, MD  Primary Care Physician:  [name], MD [address] [place] [address]     Referring Provider: [name], Md [address] [place],  [place] [address]          Chief Complaint according to patient   Patient presents with:     New Patient (Initial Visit)           HISTORY OF PRESENT ILLNESS:  Paul Baker is a 72 y.o. male patient who is here for revisit 05/04/2022 for  Sleep, RLS and headaches . Chief concern according to patient :  CPAP Patient in room #2. Patient state he still has pain and numbness in both legs. Patient states he is having RLS, anemia, but also numbness and pain in both hands and he's been having a lot of headaches.  Sometimes headaches involve the forehead towards the temples, bilaterally, starting  daily in id morning, and increasing as the day goes on.  There is another headache , one of shooting quality ,with a sudden and brief sensation " into the eye- stabbing sensation". Bp has been controlled, CPAP has worked for OSA.   He has still RLS.   That both hands have abnormal sensations.  VitalHe also describes has injured his right wrist before and does have a steel plate which may explain some of the symptoms on the right hand he had no such injury or surgery to the left before he describes the finger buries as being numb and touch and temperature are not felt.  The palm feels normal, the fingers tingle.   Excessive daytime sleepiness: ESS at 11/ 24 points, FSS at 30/ 63 points.   RLS (restless legs syndrome), every night   Grade I diastolic dysfunction  Paroxysmal atrial fibrillation (HCC), last noted onset of atrial fib just last week, feeling an abnormal sensation ascending through the neck to the face" and he voiced the following : feeling as if upper and lower teeth are needing to come out"   OSA on CPAP- complaint user, 100% compliance by days and 24 days out of 30 used  over 4 hours consecutively with an average use of time of 5 hours 17 minutes.  His CPAP is an AutoSet 11 set at 11 cm pressure with 3 cm EPR and a residual AHI of 0.9/h.  95th percentile air leak is 19 L, he does have no snoring breakthrough.  No Cheyne-Stokes respirations.  From his CPAP compliance data his apnea appears to be very well-controlled. His headgear becomes less elastic and allows more air leakage. He has facial hair, too.   Recently, after iron testing he was found to have  abnormal protein e- phoresis results.  normocytic anemia and elevated serum free light chains. This may contribute to SOB, fatigue and contribute also to Neuropathy. Workup for anemia with SPEP-no M spike, immunofixation-normal, kappa light chains 35.7, lambda light chains 25.3, ratio 1.41.    He does have shortness of breath with minimal exertion. He has neuropathy in his fingertips and feet which is chronic in nature. He has diabetes for 25 years and has tingling in the hands and feet. Diabetic polyneuropathy associated with type 2 diabetes mellitus (HCC), reports low blood sugars at night. Status post bilateral knee replacements, PVD, DVT, many small pulmonary emboli in 1982.    CAD :  Coronary atherosclerosis of native coronary artery  a. 2011: cath showing 90% stenosis along small non-dominant RCA (too small for PCI). b. 01/2018: cath showing nonobstructive CAD with 60 to 70% proximal to mid nondominant RCA stenosis, 50% mid LAD and 40 to 50% OM1      UPDATE 04/02/13 (LL): Mr. Sparacino returns for follow up visit.  He brings his Cpap machine today and the download provides a reprot for 25 days.  Compliance is around 50% He has reduced compliance this time because he was in the hospitital twice for hyperglycemia and also he states his mask is not fitting well, but he does use it religiously.  He plans on getting a new one soon. He states that his Ronne Binning is wearing off in the mid afternoons, around 2-3 pm.    He is so sleepy that he has fallen asleep during conversation. His Epworth Score today is 22. FSS is 43 points. He is exhausted, he is desperate. Rises at 4.30 AM and goes to bed at 10 PM. By midnight he wakes and often cannot resume sleep for several hours.    Compliance visit:  The download from 09-08-13 through 10-07-13 revealed an 11.5 cm water pressure need, average daily used to time 4 hours 41 minutes, the patient used the machine on 27 of 30 days. The patient used the machine 4 hours on 20 of 30 days. Compliance over 4 hours or 66%. Residual AHI of 0.5. This patient with severe obstructive sleep apnea followed by Dr. Terrace Arabia. AHI 62 titrated to 16 cm water. DME was [employer] , PCP Dr.-Luking.  Review of Systems: Out of a complete 14 system review, the patient complains of only the following symptoms, and all other reviewed systems are negative.:  Fatigue, sleepiness , snoring, fragmented sleep, Insomnia, RLS, no nocturia.    Facial pain, atrial fib,hypoglycemia,  Headaches.    How likely are you to doze in the following situations: 0 = not likely, 1 = slight chance, 2 = moderate chance, 3 = high chance   Sitting and Reading? Watching Television? Sitting inactive in a public place (theater or meeting)? As a passenger in a car for an hour without a break? Lying down in the afternoon when circumstances permit? Sitting and talking to someone? Sitting quietly after lunch without alcohol? In a car, while stopped for a few minutes in traffic?   Total = 11/ 24 points   FSS endorsed at 30/ 63 points.  GDS 2/ 15   Social History   Socioeconomic History   Marital status: Married    Spouse name: Engineer, materials   Number of children: 2   Years of education: college   Highest education level: Not on file  Occupational History   Occupation: Disabled    Employer: UNEMPLOYED  Tobacco Use   Smoking status: Former    Packs/day: 1.50    Years: 10.00    Additional pack years: 0.00    Total  pack years: 15.00    Types: Cigarettes    Start date: 06/19/1967    Quit date: 01/03/1995    Years since quitting: 27.3    Passive exposure: Never   Smokeless tobacco: Never  Vaping Use   Vaping Use: Never used  Substance and Sexual Activity   Alcohol use: No    Alcohol/week: 0.0 standard drinks of alcohol    Comment: quit drinking in 07/86   Drug use: No   Sexual activity: Yes    Partners: Female  Other Topics Concern   Not on file  Social  History Narrative    72 year old, right-handed, caucasian male with a past medical history of obesity, hypertension, hyperlipidemia, diabetes, obstructive sleep apnea, presenting with frequent nighttime awakenings, excessive daytime sleepiness, also transient confusional episodes.RLS and one beosity, OSA on CPAP with AHI of 3.2 and  setting of 16 cm water , [facility] .   Married since 1977.   Social Determinants of Health   Financial Resource Strain: Low Risk  (01/18/2021)   Overall Financial Resource Strain (CARDIA)    Difficulty of Paying Living Expenses: Not hard at all  Food Insecurity: No Food Insecurity (01/18/2021)   Hunger Vital Sign    Worried About Running Out of Food in the Last Year: Never true    Ran Out of Food in the Last Year: Never true  Transportation Needs: No Transportation Needs (01/18/2021)   PRAPARE - Administrator, Civil Service (Medical): No    Lack of Transportation (Non-Medical): No  Physical Activity: Sufficiently Active (01/18/2021)   Exercise Vital Sign    Days of Exercise per Week: 3 days    Minutes of Exercise per Session: 60 min  Stress: No Stress Concern Present (01/18/2021)   [employer] of Occupational Health - Occupational Stress Questionnaire    Feeling of Stress : Only a little  Social Connections: Socially Integrated (01/18/2021)   Social Connection and Isolation Panel [NHANES]    Frequency of Communication with Friends and Family: More than three times a week    Frequency of  Social Gatherings with Friends and Family: More than three times a week    Attends Religious Services: More than 4 times per year    Active Member of Clubs or Organizations: Yes    Attends Engineer, structural: More than 4 times per year    Marital Status: Married    Family History  Problem Relation Age of Onset   Hypertension Father    Heart attack Father    Kidney Stones Father    Seizures Grandchild    Narcolepsy Grandchild    Diabetes Sister     Past Medical History:  Diagnosis Date   Arthritis    Asthma    Atrial fibrillation [facility])    Diagnosed December 2021   Colon polyp    Coronary atherosclerosis of native coronary artery    a. 2011: cath showing 90% stenosis along small non-dominant RCA (too small for PCI). b. 01/2018: cath showing nonobstructive CAD with 60 to 70% proximal to mid nondominant RCA stenosis, 50% mid LAD and 40 to 50% OM1   DVT (deep venous thrombosis) (HCC) 2005   Right arm   Essential hypertension    Headache    History of transfusion    Hypothyroidism    Mixed hyperlipidemia    Morbid obesity (HCC)    MRSA (methicillin resistant staph aureus) culture positive    08/2012   Narcolepsy    OSA (obstructive sleep apnea)    CPAP   Osteoarthritis    Pneumonia    Psoriasis    Pulmonary embolism (HCC) 2004   RLS (restless legs syndrome)    Rotator cuff disorder    Left   Septic arthritis of knee, left (HCC)    Skin cancer, basal cell    Type 2 diabetes mellitus (HCC)     Past Surgical History:  Procedure Laterality Date   BACK SURGERY     BIOPSY  01/31/2022   Procedure: BIOPSY;  Surgeon: Lynann Bologna, DO;  Location: [facility];  Service:  Gastroenterology;;   CATARACT EXTRACTION Bilateral    COLONOSCOPY     COLONOSCOPY WITH PROPOFOL N/A 01/31/2022   Procedure: COLONOSCOPY WITH PROPOFOL;  Surgeon: Lynann Bologna, DO;  Location: [facility];  Service: Gastroenterology;  Laterality: N/A;   CYST REMOVAL TRUNK     from back    ESOPHAGOGASTRODUODENOSCOPY (EGD) WITH PROPOFOL N/A 01/31/2022   Procedure: ESOPHAGOGASTRODUODENOSCOPY (EGD) WITH PROPOFOL;  Surgeon: Lynann Bologna, DO;  Location: [facility];  Service: Gastroenterology;  Laterality: N/A;   EYE SURGERY Left 2016   laser to left eye   HIP SURGERY     bone removed from both sides of hip   KNEE ARTHROTOMY Right 12/04/2014   Procedure: KNEE ARTHROTOMY PATELLA LIGAMENT RECONSTRUSION AND REPAIR RIGHT KNEE;  Surgeon: Durene Romans, MD;  Location: [facility];  Service: Orthopedics;  Laterality: Right;   KNEE SURGERY     X 25 TIMES   LEFT HEART CATH AND CORONARY ANGIOGRAPHY N/A 01/17/2018   Procedure: LEFT HEART CATH AND CORONARY ANGIOGRAPHY;  Surgeon: Lyn Records, MD;  Location: [facility];  Service: Cardiovascular;  Laterality: N/A;   LUMBAR DISC SURGERY     Left L3, L4, L5 discecotomy with decompression of L4 root   POLYPECTOMY  01/31/2022   Procedure: POLYPECTOMY;  Surgeon: Lynann Bologna, DO;  Location: [facility];  Service: Gastroenterology;;   TONSILLECTOMY     TOTAL KNEE ARTHROPLASTY  2003   LEFT   TOTAL KNEE ARTHROPLASTY Right 03/23/2014   Procedure: RIGHT TOTAL KNEE ARTHROPLASTY AND REMOVAL RIGHT TIBIAL  DEEP IMPLANT STAPLE;  Surgeon: Durene Romans, MD;  Location: [facility];  Service: Orthopedics;  Laterality: Right;   TOTAL KNEE REVISION  2005   LEFT   WRIST SURGERY       Current Outpatient Medications on File Prior to Visit  Medication Sig Dispense Refill   acetaminophen (TYLENOL) 325 MG tablet Take 2 tablets (650 mg total) by mouth every 6 (six) hours as needed for mild pain (or Fever >/= 101). 30 tablet 1   albuterol (PROVENTIL) (2.5 MG/3ML) 0.083% nebulizer solution Take 3 mLs (2.5 mg total) by nebulization every 6 (six) hours as needed for up to 7 days for wheezing or shortness of breath. 84 mL 0   albuterol (VENTOLIN HFA) 108 (90 Base) MCG/ACT inhaler Inhale 2 puffs into the lungs every 6 (six) hours as needed for wheezing or shortness  of breath. 18 g 5   alfuzosin (UROXATRAL) 10 MG 24 hr tablet Take 10 mg by mouth daily.     apixaban (ELIQUIS) 5 MG TABS tablet Take 1 tablet (5 mg total) by mouth 2 (two) times daily. For stroke prevention 180 tablet 1   ezetimibe (ZETIA) 10 MG tablet Take 1 tablet by mouth once daily 90 tablet 0   furosemide (LASIX) 40 MG tablet Take 2 tablets (80 mg total) by mouth in the morning. Take 40 mg in the evening 90 tablet 1   Glucagon (GVOKE HYPOPEN 2-PACK) 1 MG/0.2ML SOAJ Inject 1 mg subcutaneously once as needed for low blood sugar. May repeat dose in 15 minutes as needed using a new device. 0.4 mL 1   Insulin Pen Needle (B-D ULTRAFINE III SHORT PEN) 31G X 8 MM MISC USE 1 PEN NEEDLE THREE TIMES DAILY 100 each 2   insulin regular human CONCENTRATED (HUMULIN R U-500 KWIKPEN) 500 UNIT/ML KwikPen Inject 90 units under skin with breakfast and lunch , inject 75 units under skin with supper. 60 mL 0  isosorbide mononitrate (IMDUR) 30 MG 24 hr tablet Take 0.5 tablets (15 mg total) by mouth daily. 45 tablet 1   levothyroxine (SYNTHROID) 137 MCG tablet TAKE 1 TABLET BY MOUTH ONCE DAILY BEFORE BREAKFAST 90 tablet 0   loratadine (CLARITIN) 10 MG tablet Take 10 mg by mouth daily as needed for allergies.      losartan (COZAAR) 50 MG tablet Take by mouth.     metoprolol tartrate (LOPRESSOR) 25 MG tablet Take 1 tablet (25 mg total) by mouth 2 (two) times daily. May take an additional tablet daily as needed for palpitations. 90 tablet 1   modafinil (PROVIGIL) 200 MG tablet Take 1 tablet (200 mg total) by mouth daily. 90 tablet 1   nitroGLYCERIN (NITROSTAT) 0.4 MG SL tablet DISSOLVE 1 TABLET UNDER TONGUE EVERY 5 MINUTES UP TO 15 MIN FOR CHESTPAIN. IF NO RELIEF CALL 911. 25 tablet 3   ONE TOUCH ULTRA TEST test strip TEST BLOOD SUGAR UP TO 4 TIMES DAILY. 150 each 5   ONETOUCH DELICA LANCETS 33G MISC USE AS DIRECTED TO TEST BLOOD SUGAR 4 TIMES DAILY. 150 each 5   pravastatin (PRAVACHOL) 40 MG tablet TAKE 1 TABLET BY  MOUTH ONCE DAILY IN THE EVENING 90 tablet 3   rOPINIRole (REQUIP XL) 4 MG 24 hr tablet Take 1 tablet (4 mg total) by mouth at bedtime. 90 tablet 3   rOPINIRole (REQUIP) 4 MG tablet Take 0.5 tablets (2 mg total) by mouth 2 (two) times daily. 90 tablet 3   Semaglutide, 1 MG/DOSE, 4 MG/3ML SOPN Inject 1 mg as directed once a week. 6 mL 3   TRELEGY ELLIPTA 100-62.5-25 MCG/ACT AEPB INHALE 1 PUFF ONCE DAILY 60 each 0   No current facility-administered medications on file prior to visit.    Allergies  Allergen Reactions   Tape Rash    Pulls off skin   Farxiga [Dapagliflozin]     Yeast infections, felt sick with it   Cardizem [Diltiazem Hcl]     Edema    Cardura [Doxazosin Mesylate]     Headaches / cramps   Crestor [Rosuvastatin] Other (See Comments)    Leg cramps   Lipitor [Atorvastatin] Other (See Comments)    Leg cramps   Paxil [Paroxetine Hcl]     Unknown reaction    Codeine Rash and Other (See Comments)    Headache    Gabapentin Rash     DIAGNOSTIC DATA (LABS, IMAGING, TESTING) - I reviewed patient records, labs, notes, testing and imaging myself where available.  Lab Results  Component Value Date   WBC 7.5 04/21/2022   HGB 11.2 (L) 04/21/2022   HCT 34.8 (L) 04/21/2022   MCV 90.4 04/21/2022   PLT 210 04/21/2022      Component Value Date/Time   NA 134 04/13/2022 1144   K 4.5 04/13/2022 1144   CL 97 04/13/2022 1144   CO2 21 04/13/2022 1144   GLUCOSE 290 (H) 04/13/2022 1144   GLUCOSE 380 (H) 06/03/2021 1936   BUN 42 (H) 04/13/2022 1144   CREATININE 1.26 04/13/2022 1144   CREATININE 1.11 02/06/2019 0813   CALCIUM 9.5 04/13/2022 1144   PROT 6.6 02/28/2022 1104   ALBUMIN 4.5 10/17/2021 1458   AST 16 10/17/2021 1458   ALT 16 10/17/2021 1458   ALKPHOS 80 10/17/2021 1458   BILITOT <0.2 10/17/2021 1458   GFRNONAA 44 (L) 06/03/2021 1936   GFRNONAA 68 02/06/2019 0813   GFRAA 87 12/04/2019 0832   GFRAA 79 02/06/2019 0813  Lab Results  Component Value Date   CHOL 166  04/05/2021   HDL 55 04/05/2021   LDLCALC 74 04/05/2021   TRIG 226 (H) 04/05/2021   CHOLHDL 3.0 04/05/2021   Lab Results  Component Value Date   HGBA1C 8.9 (A) 04/03/2022   Lab Results  Component Value Date   VITAMINB12 248 04/21/2022   Lab Results  Component Value Date   TSH 1.230 12/06/2021    PHYSICAL EXAM:  Today's Vitals   05/04/22 1441  BP: 137/69  Pulse: 88  Weight: 228 lb 0.3 oz (103.4 kg)  Height: 5\' 5"  (1.651 m)   Body mass index is 37.94 kg/m.   Wt Readings from Last 3 Encounters:  05/04/22 228 lb 0.3 oz (103.4 kg)  05/04/22 230 lb (104.3 kg)  04/21/22 229 lb 14.4 oz (104.3 kg)     Ht Readings from Last 3 Encounters:  05/04/22 5\' 5"  (1.651 m)  04/21/22 5\' 5"  (1.651 m)  04/07/22 5\' 5"  (1.651 m)      General: The patient is awake, alert and appears not in acute distress. The patient is well groomed. Head: Normocephalic, atraumatic. Neck is supple.  BMI 37.94 on ozempic.  Mallampati 3 plus , facial hair.   neck circumference:20.5  inches .  Cardiovascular:  Regular rate and cardiac rhythm by pulse,  without distended neck veins. Respiratory: Lungs are clear to auscultation.  Skin:  With evidence of ankle edema,. Trunk: The patient's posture is erect.   Neurologic exam : The patient is awake and alert, oriented to place and time.   Memory subjective described as intact.  Attention span & concentration ability appears normal.  Speech is fluent,  without  dysarthria, dysphonia or aphasia.  Mood and affect are appropriate.   Cranial nerves: no loss of smell or taste reported  Pupils are equal and briskly reactive to light. Funduscopic exam deferred. .  Extraocular movements in vertical and horizontal planes were intact and without nystagmus. No Diplopia. Visual fields by finger perimetry are intact. Hearing was intact to soft voice and finger rubbing.    Facial sensation intact to fine touch.  Facial motor strength is symmetric and tongue in  midline.  Neck ROM : rotation, tilt and flexion extension were normal for age and shoulder shrug was symmetrical.    Motor exam:  Symmetric bulk, tone and ROM.  Left foot weakness of dorsiflexion,  Normal tone without cog- wheeling, symmetric grip strength .     Sensory:  Fine touch, pinprick and vibration were absent over both knees and ankles.  Proprioception tested in the upper extremities was normal.   Coordination: Rapid alternating movements in the fingers/hands were of normal speed.  The Finger-to-nose maneuver was intact without evidence of ataxia or tremor.   Gait and station: Patient could rise unassisted from a seated position, walked without assistive device.  Stance is of normal width/ base and the patient turned with 4 steps.  Toe and heel walk were deferred.  Deep tendon reflexes: in the  upper and lower extremities are absent .  Babinski response was deferred.        ASSESSMENT AND PLAN 72 y.o. year old male  here with:    1) patient has been seen many years for RLS in the setting of leg swelling, PVD, diabetic neuropathy, barely controlled by high dose mirapex.   2) Hypersomnia is milder, but  OSA is well controlled. Its not the OSA that causes sleepiness.   3) Headaches are described  as daily , independent of atrial fib, independent of hypoglycemia.   I will ask Dr Lucia Gaskins for a headache consultation.    I plan to follow up either personally or through our NP within 6 months.   I would like to thank [name], MD and [name], Md [address] [place],  [place] [address] for allowing me to meet with and to take care of this pleasant patient.    After spending a total time of  40  minutes face to face and additional time for physical and neurologic examination, review of laboratory studies,  personal review of imaging studies, reports and results of other testing and review of referral information / records as far as provided in visit,    Electronically signed by: Melvyn Novas, MD 05/04/2022 3:00 PM  [facility] Board certified by The [employer] of Sleep Medicine and Diplomate of the [employer] of Sleep Medicine. Board certified In Neurology through the ABPN, Fellow of the [employer] of Neurology. Medical Director of [facility].

## 2022-05-04 NOTE — Patient Instructions (Signed)
Cluster Headache Cluster headaches hurt a lot. They normally happen on one side of your head or face, but they may switch sides. Often, cluster headaches: Cause a lot of pain. Happen often for weeks to months. Last from 15 minutes to 3 hours. Happen at the same time each day, often at night. Happen many times a day. Happen in the fall and springtime. What are the causes? The exact cause is not known. They are not usually caused by foods, changes in body chemicals (hormonal changes), or stress. What increases the risk? Being a male between the ages of 106-56 years old. Smoking or using products that contain nicotine or tobacco. Having high levels of body chemical called histamine. This can happen in people who have allergies. Taking certain medicines that cause blood vessels to get bigger. Having a parent or sibling who has cluster headaches. What are the signs or symptoms? Very bad pain on one side of the head that begins behind or around your eye but may spread to your face, head, and neck. Feeling like you may vomit (nauseous). Being sensitive to light. A runny nose and stuffy nose. Sweating on the forehead or face on the side where the pain is. Eye problems. This might include a droopy or swollen eyelid, eye redness, or tearing on the side where the pain is. Feeling restless or upset. Pale skin or a red face (flushed). How is this treated? Medicines. Oxygen that is breathed in through a mask. A steroid shot (injection) in the back of the head just above the neck (occipital nerve block). This shot numbs painful areas in the head. Surgery in very bad cases. Follow these instructions at home: Headache diary Keep a headache diary. Doing this can help you and your doctor find out what triggers your headaches. In the diary, include: The time of day that your headache started and what you were doing when it began. How long your headache lasted. Where your pain started and if it moved to  other areas. The type of pain. Your level of pain. Use a pain scale and rate the pain with a number from 1 (mild) up to 10 (very bad). The treatment that you used, and any change in symptoms after treatment. Medicines Take over-the-counter and prescription medicines only as told by your doctor. If told, take steps to prevent problems with pooping (constipation). You may need to: Drink enough fluid to keep your pee (urine) pale yellow. Take medicines. You will be told what medicines to take. Eat foods that are high in fiber. These include beans, whole grains, and fresh fruits and vegetables. Limit foods that are high in fat and sugar. These include fried or sweet foods. Ask your doctor if you should avoid driving or using machines while you are taking your medicine. Lifestyle Get 7-9 hours of sleep each night, or the amount recommended by your doctor. Limit or manage stress. Options may include acupuncture, counseling, and massage. Exercise regularly. Exercise for at least 30 minutes, 5 times each week. Eat a healthy diet. Avoid any foods that you know may trigger your headaches. Do not drink alcohol. Do not smoke or use any products that contain nicotine or tobacco. If you need help quitting, ask your doctor. General instructions Use oxygen as told by your doctor. Contact a doctor if: Your headaches: Change. Get worse. Happen more often. Your medicines or oxygen are not helping. Get help right away if: You faint. You have weakness or lose feeling (have numbness) on one  side of your body or face. You see two of everything (double vision). You vomit or feel you may vomit (nauseous), and it does not stop after many hours. You have trouble with your balance or with walking. You have trouble talking. You have neck pain or stiffness and you have a fever. This information is not intended to replace advice given to you by your health care provider. Make sure you discuss any questions you  have with your health care provider. Document Revised: 09/08/2021 Document Reviewed: 08/15/2021 Elsevier Patient Education  2023 [employer].

## 2022-05-05 ENCOUNTER — Ambulatory Visit (INDEPENDENT_AMBULATORY_CARE_PROVIDER_SITE_OTHER): Payer: Medicare HMO

## 2022-05-05 VITALS — Ht 65.0 in | Wt 230.0 lb

## 2022-05-05 DIAGNOSIS — Z79899 Other long term (current) drug therapy: Secondary | ICD-10-CM | POA: Diagnosis not present

## 2022-05-05 DIAGNOSIS — Z0000 Encounter for general adult medical examination without abnormal findings: Secondary | ICD-10-CM | POA: Diagnosis not present

## 2022-05-05 NOTE — Patient Instructions (Signed)
Paul Baker , Thank you for taking time to come for your Medicare Wellness Visit. I appreciate your ongoing commitment to your health goals. Please review the following plan we discussed and let me know if I can assist you in the future.   These are the goals we discussed:  Goals      Exercise 3x per week (30 min per time)     Continue to keep up exercise. Travel.     Remain active and independent        This is a list of the screening recommended for you and due dates:  Health Maintenance  Topic Date Due   Hepatitis C Screening: USPSTF Recommendation to screen - Ages 71-79 yo.  Never done   DTaP/Tdap/Td vaccine (2 - Tdap) 05/23/2016   COVID-19 Vaccine (3 - Pfizer risk series) 04/24/2019   Eye exam for diabetics  12/30/2021   Zoster (Shingles) Vaccine (1 of 2) 07/04/2022*   Flu Shot  08/03/2022   Hemoglobin A1C  10/03/2022   Yearly kidney health urinalysis for diabetes  12/07/2022   Complete foot exam   12/13/2022   Yearly kidney function blood test for diabetes  04/13/2023   Medicare Annual Wellness Visit  05/05/2023   Colon Cancer Screening  02/01/2027   Pneumonia Vaccine  Completed   HPV Vaccine  Aged Out  *Topic was postponed. The date shown is not the original due date.    Advanced directives: Please bring a copy of your health care power of attorney and living will to the office to be added to your chart at your convenience.   Conditions/risks identified: Aim for 30 minutes of exercise or brisk walking, 6-8 glasses of water, and 5 servings of fruits and vegetables each day.  Next appointment: Follow up in one year for your annual wellness visit.   Preventive Care 21 Years and Older, Male  Preventive care refers to lifestyle choices and visits with your health care provider that can promote health and wellness. What does preventive care include? A yearly physical exam. This is also called an annual well check. Dental exams once or twice a year. Routine eye exams.  Ask your health care provider how often you should have your eyes checked. Personal lifestyle choices, including: Daily care of your teeth and gums. Regular physical activity. Eating a healthy diet. Avoiding tobacco and drug use. Limiting alcohol use. Practicing safe sex. Taking low doses of aspirin every day. Taking vitamin and mineral supplements as recommended by your health care provider. What happens during an annual well check? The services and screenings done by your health care provider during your annual well check will depend on your age, overall health, lifestyle risk factors, and family history of disease. Counseling  Your health care provider may ask you questions about your: Alcohol use. Tobacco use. Drug use. Emotional well-being. Home and relationship well-being. Sexual activity. Eating habits. History of falls. Memory and ability to understand (cognition). Work and work Astronomer. Screening  You may have the following tests or measurements: Height, weight, and BMI. Blood pressure. Lipid and cholesterol levels. These may be checked every 5 years, or more frequently if you are over 71 years old. Skin check. Lung cancer screening. You may have this screening every year starting at age 71 if you have a 30-pack-year history of smoking and currently smoke or have quit within the past 15 years. Fecal occult blood test (FOBT) of the stool. You may have this test every year starting at age  71. Flexible sigmoidoscopy or colonoscopy. You may have a sigmoidoscopy every 5 years or a colonoscopy every 10 years starting at age 71. Prostate cancer screening. Recommendations will vary depending on your family history and other risks. Hepatitis C blood test. Hepatitis B blood test. Sexually transmitted disease (STD) testing. Diabetes screening. This is done by checking your blood sugar (glucose) after you have not eaten for a while (fasting). You may have this done every 1-3  years. Abdominal aortic aneurysm (AAA) screening. You may need this if you are a current or former smoker. Osteoporosis. You may be screened starting at age 71 if you are at high risk. Talk with your health care provider about your test results, treatment options, and if necessary, the need for more tests. Vaccines  Your health care provider may recommend certain vaccines, such as: Influenza vaccine. This is recommended every year. Tetanus, diphtheria, and acellular pertussis (Tdap, Td) vaccine. You may need a Td booster every 10 years. Zoster vaccine. You may need this after age 71. Pneumococcal 13-valent conjugate (PCV13) vaccine. One dose is recommended after age 71. Pneumococcal polysaccharide (PPSV23) vaccine. One dose is recommended after age 71. Talk to your health care provider about which screenings and vaccines you need and how often you need them. This information is not intended to replace advice given to you by your health care provider. Make sure you discuss any questions you have with your health care provider. Document Released: 01/15/2015 Document Revised: 09/08/2015 Document Reviewed: 10/20/2014 Elsevier Interactive Patient Education  2017 [place] Prevention in the Home Falls can cause injuries. They can happen to people of all ages. There are many things you can do to make your home safe and to help prevent falls. What can I do on the outside of my home? Regularly fix the edges of walkways and driveways and fix any cracks. Remove anything that might make you trip as you walk through a door, such as a raised step or threshold. Trim any bushes or trees on the path to your home. Use bright outdoor lighting. Clear any walking paths of anything that might make someone trip, such as rocks or tools. Regularly check to see if handrails are loose or broken. Make sure that both sides of any steps have handrails. Any raised decks and porches should have guardrails on the  edges. Have any leaves, snow, or ice cleared regularly. Use sand or salt on walking paths during winter. Clean up any spills in your garage right away. This includes oil or grease spills. What can I do in the bathroom? Use night lights. Install grab bars by the toilet and in the tub and shower. Do not use towel bars as grab bars. Use non-skid mats or decals in the tub or shower. If you need to sit down in the shower, use a plastic, non-slip stool. Keep the floor dry. Clean up any water that spills on the floor as soon as it happens. Remove soap buildup in the tub or shower regularly. Attach bath mats securely with double-sided non-slip rug tape. Do not have throw rugs and other things on the floor that can make you trip. What can I do in the bedroom? Use night lights. Make sure that you have a light by your bed that is easy to reach. Do not use any sheets or blankets that are too big for your bed. They should not hang down onto the floor. Have a firm chair that has side arms. You can use  this for support while you get dressed. Do not have throw rugs and other things on the floor that can make you trip. What can I do in the kitchen? Clean up any spills right away. Avoid walking on wet floors. Keep items that you use a lot in easy-to-reach places. If you need to reach something above you, use a strong step stool that has a grab bar. Keep electrical cords out of the way. Do not use floor polish or wax that makes floors slippery. If you must use wax, use non-skid floor wax. Do not have throw rugs and other things on the floor that can make you trip. What can I do with my stairs? Do not leave any items on the stairs. Make sure that there are handrails on both sides of the stairs and use them. Fix handrails that are broken or loose. Make sure that handrails are as long as the stairways. Check any carpeting to make sure that it is firmly attached to the stairs. Fix any carpet that is loose or  worn. Avoid having throw rugs at the top or bottom of the stairs. If you do have throw rugs, attach them to the floor with carpet tape. Make sure that you have a light switch at the top of the stairs and the bottom of the stairs. If you do not have them, ask someone to add them for you. What else can I do to help prevent falls? Wear shoes that: Do not have high heels. Have rubber bottoms. Are comfortable and fit you well. Are closed at the toe. Do not wear sandals. If you use a stepladder: Make sure that it is fully opened. Do not climb a closed stepladder. Make sure that both sides of the stepladder are locked into place. Ask someone to hold it for you, if possible. Clearly mark and make sure that you can see: Any grab bars or handrails. First and last steps. Where the edge of each step is. Use tools that help you move around (mobility aids) if they are needed. These include: Canes. Walkers. Scooters. Crutches. Turn on the lights when you go into a dark area. Replace any light bulbs as soon as they burn out. Set up your furniture so you have a clear path. Avoid moving your furniture around. If any of your floors are uneven, fix them. If there are any pets around you, be aware of where they are. Review your medicines with your doctor. Some medicines can make you feel dizzy. This can increase your chance of falling. Ask your doctor what other things that you can do to help prevent falls. This information is not intended to replace advice given to you by your health care provider. Make sure you discuss any questions you have with your health care provider. Document Released: 10/15/2008 Document Revised: 05/27/2015 Document Reviewed: 01/23/2014 Elsevier Interactive Patient Education  2017 [employer].

## 2022-05-05 NOTE — Progress Notes (Signed)
Subjective:   Paul Baker is a 72 y.o. male who presents for Medicare Annual/Subsequent preventive examination.  Review of Systems     Cardiac Risk Factors include: advanced age (>24men, >31 women);diabetes mellitus;hypertension;male gender;dyslipidemia     Objective:    Today's Vitals   05/05/22 1126  Weight: 230 lb (104.3 kg)  Height: 5\' 5"  (1.651 m)   Body mass index is 38.27 kg/m.     05/05/2022   11:31 AM 05/04/2022   10:38 AM 04/21/2022   10:41 AM 01/31/2022    7:36 AM 06/03/2021    7:12 PM 03/11/2021    3:02 PM 01/18/2021   11:01 AM  Advanced Directives  Does Patient Have a Medical Advance Directive? Yes Yes Yes Yes No No Yes  Type of Advance Directive Living will;Healthcare Power of [employer] Power of [place];Living will Healthcare Power of [place];Living will    Healthcare Power of [place];Living will  Does patient want to make changes to medical advance directive? No - Patient declined No - Patient declined No - Patient declined      Copy of Healthcare Power of Attorney in Chart? No - copy requested No - copy requested No - copy requested    No - copy requested  Would patient like information on creating a medical advance directive?  No - Patient declined No - Patient declined  No - Patient declined No - Patient declined     Current Medications (verified) Outpatient Encounter Medications as of 05/05/2022  Medication Sig   acetaminophen (TYLENOL) 325 MG tablet Take 2 tablets (650 mg total) by mouth every 6 (six) hours as needed for mild pain (or Fever >/= 101).   albuterol (PROVENTIL) (2.5 MG/3ML) 0.083% nebulizer solution Take 3 mLs (2.5 mg total) by nebulization every 6 (six) hours as needed for up to 7 days for wheezing or shortness of breath.   albuterol (VENTOLIN HFA) 108 (90 Base) MCG/ACT inhaler Inhale 2 puffs into the lungs every 6 (six) hours as needed for wheezing or shortness of breath.   alfuzosin (UROXATRAL) 10 MG 24 hr tablet Take 10 mg by  mouth daily.   Alpha Lipoic Acid-Biotin ER 600-450 MG-MCG TBCR Take 600 mg by mouth every morning.   apixaban (ELIQUIS) 5 MG TABS tablet Take 1 tablet (5 mg total) by mouth 2 (two) times daily. For stroke prevention   cyanocobalamin (VITAMIN B12) 500 MCG tablet Take 1 tablet (500 mcg total) by mouth daily.   ezetimibe (ZETIA) 10 MG tablet Take 1 tablet by mouth once daily   ferrous sulfate 325 (65 FE) MG EC tablet Take 1 tablet (325 mg total) by mouth daily with breakfast.   furosemide (LASIX) 40 MG tablet Take 2 tablets (80 mg total) by mouth in the morning. Take 40 mg in the evening   Glucagon (GVOKE HYPOPEN 2-PACK) 1 MG/0.2ML SOAJ Inject 1 mg subcutaneously once as needed for low blood sugar. May repeat dose in 15 minutes as needed using a new device.   Insulin Pen Needle (B-D ULTRAFINE III SHORT PEN) 31G X 8 MM MISC USE 1 PEN NEEDLE THREE TIMES DAILY   insulin regular human CONCENTRATED (HUMULIN R U-500 KWIKPEN) 500 UNIT/ML KwikPen Inject 90 units under skin with breakfast and lunch , inject 75 units under skin with supper.   isosorbide mononitrate (IMDUR) 30 MG 24 hr tablet Take 0.5 tablets (15 mg total) by mouth daily.   levothyroxine (SYNTHROID) 137 MCG tablet TAKE 1 TABLET BY MOUTH ONCE DAILY BEFORE BREAKFAST  loratadine (CLARITIN) 10 MG tablet Take 10 mg by mouth daily as needed for allergies.    losartan (COZAAR) 50 MG tablet Take by mouth.   metoprolol tartrate (LOPRESSOR) 25 MG tablet Take 1 tablet (25 mg total) by mouth 2 (two) times daily. May take an additional tablet daily as needed for palpitations.   modafinil (PROVIGIL) 200 MG tablet Take 1 tablet (200 mg total) by mouth daily.   nitroGLYCERIN (NITROSTAT) 0.4 MG SL tablet DISSOLVE 1 TABLET UNDER TONGUE EVERY 5 MINUTES UP TO 15 MIN FOR CHESTPAIN. IF NO RELIEF CALL 911.   ONE TOUCH ULTRA TEST test strip TEST BLOOD SUGAR UP TO 4 TIMES DAILY.   ONETOUCH DELICA LANCETS 33G MISC USE AS DIRECTED TO TEST BLOOD SUGAR 4 TIMES DAILY.    pravastatin (PRAVACHOL) 40 MG tablet TAKE 1 TABLET BY MOUTH ONCE DAILY IN THE EVENING   rOPINIRole (REQUIP XL) 4 MG 24 hr tablet Take 1 tablet (4 mg total) by mouth at bedtime.   rOPINIRole (REQUIP) 4 MG tablet Take 0.5 tablets (2 mg total) by mouth 2 (two) times daily.   Semaglutide, 1 MG/DOSE, 4 MG/3ML SOPN Inject 1 mg as directed once a week.   TRELEGY ELLIPTA 100-62.5-25 MCG/ACT AEPB INHALE 1 PUFF ONCE DAILY   No facility-administered encounter medications on file as of 05/05/2022.    Allergies (verified) Tape, Farxiga [dapagliflozin], Cardizem [diltiazem hcl], Cardura [doxazosin mesylate], Crestor [rosuvastatin], Lipitor [atorvastatin], Paxil [paroxetine hcl], Codeine, and Gabapentin   History: Past Medical History:  Diagnosis Date   Arthritis    Asthma    Atrial fibrillation [facility])    Diagnosed December 2021   Colon polyp    Coronary atherosclerosis of native coronary artery    a. 2011: cath showing 90% stenosis along small non-dominant RCA (too small for PCI). b. 01/2018: cath showing nonobstructive CAD with 60 to 70% proximal to mid nondominant RCA stenosis, 50% mid LAD and 40 to 50% OM1   DVT (deep venous thrombosis) (HCC) 2005   Right arm   Essential hypertension    Headache    History of transfusion    Hypothyroidism    Mixed hyperlipidemia    Morbid obesity (HCC)    MRSA (methicillin resistant staph aureus) culture positive    08/2012   Narcolepsy    OSA (obstructive sleep apnea)    CPAP   Osteoarthritis    Pneumonia    Psoriasis    Pulmonary embolism (HCC) 2004   RLS (restless legs syndrome)    Rotator cuff disorder    Left   Septic arthritis of knee, left (HCC)    Skin cancer, basal cell    Type 2 diabetes mellitus (HCC)    Past Surgical History:  Procedure Laterality Date   BACK SURGERY     BIOPSY  01/31/2022   Procedure: BIOPSY;  Surgeon: Lynann Bologna, DO;  Location: [facility];  Service: Gastroenterology;;   CATARACT EXTRACTION Bilateral     COLONOSCOPY     COLONOSCOPY WITH PROPOFOL N/A 01/31/2022   Procedure: COLONOSCOPY WITH PROPOFOL;  Surgeon: Lynann Bologna, DO;  Location: [facility];  Service: Gastroenterology;  Laterality: N/A;   CYST REMOVAL TRUNK     from back   ESOPHAGOGASTRODUODENOSCOPY (EGD) WITH PROPOFOL N/A 01/31/2022   Procedure: ESOPHAGOGASTRODUODENOSCOPY (EGD) WITH PROPOFOL;  Surgeon: Lynann Bologna, DO;  Location: [facility];  Service: Gastroenterology;  Laterality: N/A;   EYE SURGERY Left 2016   laser to left eye   HIP SURGERY  bone removed from both sides of hip   KNEE ARTHROTOMY Right 12/04/2014   Procedure: KNEE ARTHROTOMY PATELLA LIGAMENT RECONSTRUSION AND REPAIR RIGHT KNEE;  Surgeon: Durene Romans, MD;  Location: [facility];  Service: Orthopedics;  Laterality: Right;   KNEE SURGERY     X 25 TIMES   LEFT HEART CATH AND CORONARY ANGIOGRAPHY N/A 01/17/2018   Procedure: LEFT HEART CATH AND CORONARY ANGIOGRAPHY;  Surgeon: Lyn Records, MD;  Location: [facility];  Service: Cardiovascular;  Laterality: N/A;   LUMBAR DISC SURGERY     Left L3, L4, L5 discecotomy with decompression of L4 root   POLYPECTOMY  01/31/2022   Procedure: POLYPECTOMY;  Surgeon: Lynann Bologna, DO;  Location: [facility];  Service: Gastroenterology;;   TONSILLECTOMY     TOTAL KNEE ARTHROPLASTY  2003   LEFT   TOTAL KNEE ARTHROPLASTY Right 03/23/2014   Procedure: RIGHT TOTAL KNEE ARTHROPLASTY AND REMOVAL RIGHT TIBIAL  DEEP IMPLANT STAPLE;  Surgeon: Durene Romans, MD;  Location: [facility];  Service: Orthopedics;  Laterality: Right;   TOTAL KNEE REVISION  2005   LEFT   WRIST SURGERY     Family History  Problem Relation Age of Onset   Hypertension Father    Heart attack Father    Kidney Stones Father    Seizures Grandchild    Narcolepsy Grandchild    Diabetes Sister    Social History   Socioeconomic History   Marital status: Married    Spouse name: Engineer, materials   Number of children: 2   Years of education: college    Highest education level: Not on file  Occupational History   Occupation: Disabled    Employer: UNEMPLOYED  Tobacco Use   Smoking status: Former    Packs/day: 1.50    Years: 10.00    Additional pack years: 0.00    Total pack years: 15.00    Types: Cigarettes    Start date: 06/19/1967    Quit date: 01/03/1995    Years since quitting: 27.3    Passive exposure: Never   Smokeless tobacco: Never  Vaping Use   Vaping Use: Never used  Substance and Sexual Activity   Alcohol use: No    Alcohol/week: 0.0 standard drinks of alcohol    Comment: quit drinking in 07/86   Drug use: No   Sexual activity: Yes    Partners: Female  Other Topics Concern   Not on file  Social History Narrative    72 year old, right-handed, caucasian male with a past medical history of obesity, hypertension, hyperlipidemia, diabetes, obstructive sleep apnea, presenting with frequent nighttime awakenings, excessive daytime sleepiness, also transient confusional episodes.RLS and one beosity, OSA on CPAP with AHI of 3.2 and  setting of 16 cm water , [facility] .   Married since 1977.   Social Determinants of Health   Financial Resource Strain: Low Risk  (05/05/2022)   Overall Financial Resource Strain (CARDIA)    Difficulty of Paying Living Expenses: Not hard at all  Food Insecurity: No Food Insecurity (05/05/2022)   Hunger Vital Sign    Worried About Running Out of Food in the Last Year: Never true    Ran Out of Food in the Last Year: Never true  Transportation Needs: No Transportation Needs (05/05/2022)   PRAPARE - Administrator, Civil Service (Medical): No    Lack of Transportation (Non-Medical): No  Physical Activity: Insufficiently Active (05/05/2022)   Exercise Vital Sign    Days of Exercise per  Week: 3 days    Minutes of Exercise per Session: 30 min  Stress: No Stress Concern Present (05/05/2022)   [employer] of Occupational Health - Occupational Stress Questionnaire    Feeling of Stress :  Not at all  Social Connections: Socially Integrated (05/05/2022)   Social Connection and Isolation Panel [NHANES]    Frequency of Communication with Friends and Family: More than three times a week    Frequency of Social Gatherings with Friends and Family: More than three times a week    Attends Religious Services: More than 4 times per year    Active Member of [employer] or Organizations: Yes    Attends Engineer, structural: More than 4 times per year    Marital Status: Married    Tobacco Counseling Counseling given: Not Answered   Clinical Intake:  Pre-visit preparation completed: Yes  Pain : No/denies pain  Diabetes: Yes CBG done?: No Did pt. bring in CBG monitor from home?: No  How often do you need to have someone help you when you read instructions, pamphlets, or other written materials from your doctor or pharmacy?: 1 - Never  Diabetic?Yes   Nutrition Risk Assessment:  Has the patient had any N/V/D within the last 2 months?  No  Does the patient have any non-healing wounds?  No  Has the patient had any unintentional weight loss or weight gain?  No   Diabetes:  Is the patient diabetic?  Yes  If diabetic, was a CBG obtained today?  No  Did the patient bring in their glucometer from home?  No  How often do you monitor your CBG's? As directed by endo.   Financial Strains and Diabetes Management:  Are you having any financial strains with the device, your supplies or your medication?  Receives patient assistance for insulin .  Does the patient want to be seen by Chronic Care Management for management of their diabetes?  No  Would the patient like to be referred to a Nutritionist or for Diabetic Management?  No   Diabetic Exams:  Diabetic Eye Exam: Completed records requested Diabetic Foot Exam: Completed 12/12/21   Interpreter Needed?: No  Information entered by :: Kandis Fantasia LPN   Activities of Daily Living    05/05/2022   11:31 AM  In your  present state of health, do you have any difficulty performing the following activities:  Hearing? 0  Vision? 0  Difficulty concentrating or making decisions? 0  Walking or climbing stairs? 0  Dressing or bathing? 0  Doing errands, shopping? 0  Preparing Food and eating ? N  Using the Toilet? N  In the past six months, have you accidently leaked urine? N  Do you have problems with loss of bowel control? N  Managing your Medications? N  Managing your Finances? N  Housekeeping or managing your Housekeeping? N    Patient Care Team: [name], MD as PCP - General (Family Medicine) Jonelle Sidle, MD as PCP - Cardiology (Cardiology) Dohmeier, Porfirio Mylar, MD as Consulting Physician (Neurology) [place], [facility] Doreatha Massed, MD as Consulting Physician (Hematology) Dani Gobble, NP as Nurse Practitioner (Nurse Practitioner)  Indicate any recent Medical Services you may have received from other than [facility] providers in the past year (date may be approximate).     Assessment:   This is a routine wellness examination for Raney.  Hearing/Vision screen Hearing Screening - Comments:: Slightly hard of hearing  Vision Screening - Comments::  Wears rx glasses - up to date with routine eye exams with [facility]    Dietary issues and exercise activities discussed: Current Exercise Habits: Home exercise routine, Type of exercise: walking, Time (Minutes): 30, Frequency (Times/Week): 3, Weekly Exercise (Minutes/Week): 90, Intensity: Mild   Goals Addressed             This Visit's Progress    COMPLETED: Medication Management       Patient Goals/Self-Care Activities Patient will:  Take medications as prescribed Check blood sugar continuously with continuous glucose monitor, document, and provide at future appointments Check blood pressure at least once daily, document, and provide at future appointments Collaborate with provider on  medication access solutions Engage in dietary modifications by fewer sweetened foods & beverages        Remain active and independent        Depression Screen    05/05/2022   11:30 AM 04/04/2022    8:54 AM 02/24/2022    8:57 AM 11/03/2021    4:56 PM 10/17/2021    1:34 PM 01/18/2021   10:41 AM 11/18/2019    2:33 PM  PHQ 2/9 Scores  PHQ - 2 Score 0 0 0 0 0 0 0  PHQ- 9 Score 0 1  4       Fall Risk    05/05/2022   11:29 AM 04/04/2022    8:54 AM 02/24/2022    8:57 AM 11/03/2021    4:56 PM 10/17/2021    1:33 PM  Fall Risk   Falls in the past year? 0 1 1 1 1   Number falls in past yr: 0 0 0 1 1  Injury with Fall? 0 0 0 0 0  Risk for fall due to : No Fall Risks History of fall(s) No Fall Risks No Fall Risks Impaired balance/gait  Follow up Falls prevention discussed;Education provided;Falls evaluation completed Falls evaluation completed Falls evaluation completed Falls evaluation completed Falls evaluation completed;Education provided;Falls prevention discussed    FALL RISK PREVENTION PERTAINING TO THE HOME:  Any stairs in or around the home? No  If so, are there any without handrails? No  Home free of loose throw rugs in walkways, pet beds, electrical cords, etc? Yes  Adequate lighting in your home to reduce risk of falls? Yes   ASSISTIVE DEVICES UTILIZED TO PREVENT FALLS:  Life alert? No  Use of a cane, walker or w/c? No  Grab bars in the bathroom? Yes  Shower chair or bench in shower? No  Elevated toilet seat or a handicapped toilet? Yes   TIMED UP AND GO:  Was the test performed? No . Telephonic visit   Cognitive Function:        05/05/2022   11:32 AM 01/18/2021   10:53 AM  6CIT Screen  What Year? 0 points 0 points  What month? 0 points 0 points  What time? 0 points 0 points  Count back from 20 0 points 0 points  Months in reverse 0 points 0 points  Repeat phrase 0 points 2 points  Total Score 0 points 2 points    Immunizations Immunization History   Administered Date(s) Administered   Fluad Quad(high Dose 65+) 11/03/2021   Influenza Split 09/18/2012   Influenza,inj,Quad PF,6+ Mos 10/13/2013, 10/06/2014, 09/11/2018   Influenza-Unspecified 12/01/2015, 11/08/2016, 11/02/2020   PFIZER(Purple Top)SARS-COV-2 Vaccination 03/06/2019, 03/27/2019   PNEUMOCOCCAL CONJUGATE-20 02/24/2022   Pneumococcal Polysaccharide-23 10/02/2004, 10/03/2011   Td 05/24/2006    TDAP status: Due, Education has been provided regarding the  importance of this vaccine. Advised may receive this vaccine at local pharmacy or Health Dept. Aware to provide a copy of the vaccination record if obtained from local pharmacy or Health Dept. Verbalized acceptance and understanding.  Flu Vaccine status: Up to date  Pneumococcal vaccine status: Up to date  Covid-19 vaccine status: Information provided on how to obtain vaccines.   Qualifies for Shingles Vaccine? Yes   Zostavax completed No   Shingrix Completed?: No.    Education has been provided regarding the importance of this vaccine. Patient has been advised to call insurance company to determine out of pocket expense if they have not yet received this vaccine. Advised may also receive vaccine at local pharmacy or Health Dept. Verbalized acceptance and understanding.  Screening Tests Health Maintenance  Topic Date Due   Hepatitis C Screening  Never done   DTaP/Tdap/Td (2 - Tdap) 05/23/2016   COVID-19 Vaccine (3 - Pfizer risk series) 04/24/2019   OPHTHALMOLOGY EXAM  12/30/2021   Zoster Vaccines- Shingrix (1 of 2) 07/04/2022 (Originally 06/19/1969)   INFLUENZA VACCINE  08/03/2022   HEMOGLOBIN A1C  10/03/2022   Diabetic kidney evaluation - Urine ACR  12/07/2022   FOOT EXAM  12/13/2022   Diabetic kidney evaluation - eGFR measurement  04/13/2023   Medicare Annual Wellness (AWV)  05/05/2023   COLONOSCOPY (Pts 45-47yrs Insurance coverage will need to be confirmed)  02/01/2027   Pneumonia Vaccine 62+ Years old  Completed    HPV VACCINES  Aged Out    Health Maintenance  Health Maintenance Due  Topic Date Due   Hepatitis C Screening  Never done   DTaP/Tdap/Td (2 - Tdap) 05/23/2016   COVID-19 Vaccine (3 - Pfizer risk series) 04/24/2019   OPHTHALMOLOGY EXAM  12/30/2021    Colorectal cancer screening: Type of screening: Colonoscopy. Completed 01/31/22. Repeat every 5 years  Lung Cancer Screening: (Low Dose CT Chest recommended if Age 92-80 years, 30 pack-year currently smoking OR have quit w/in 15years.) does not qualify.   Lung Cancer Screening Referral: n/a  Additional Screening:  Hepatitis C Screening: does qualify; Patient declines   Vision Screening: Recommended annual ophthalmology exams for early detection of glaucoma and other disorders of the eye. Is the patient up to date with their annual eye exam?  Yes  Who is the provider or what is the name of the office in which the patient attends annual eye exams? [facility] If pt is not established with a provider, would they like to be referred to a provider to establish care? No .   Dental Screening: Recommended annual dental exams for proper oral hygiene  Community Resource Referral / Chronic Care Management: CRR required this visit?  No   CCM required this visit?  No      Plan:     I have personally reviewed and noted the following in the patient's chart:   Medical and social history Use of alcohol, tobacco or illicit drugs  Current medications and supplements including opioid prescriptions. Patient is not currently taking opioid prescriptions. Functional ability and status Nutritional status Physical activity Advanced directives List of other physicians Hospitalizations, surgeries, and ER visits in previous 12 months Vitals Screenings to include cognitive, depression, and falls Referrals and appointments  In addition, I have reviewed and discussed with patient certain preventive protocols, quality metrics, and best  practice recommendations. A written personalized care plan for preventive services as well as general preventive health recommendations were provided to patient.     Durwin Nora,  LPN   01/07/1094   Due to this being a virtual visit, the after visit summary with patients personalized plan was offered to patient via mail or my-chart.  Patient would like to access on my-chart  Nurse Notes: No concerns

## 2022-05-06 LAB — LIPID PANEL
Chol/HDL Ratio: 3.5 ratio (ref 0.0–5.0)
Cholesterol, Total: 150 mg/dL (ref 100–199)
HDL: 43 mg/dL (ref >39)
LDL Chol Calc (NIH): 73 mg/dL (ref 0–99)
Triglycerides: 206 mg/dL — ABNORMAL HIGH (ref 0–149)
VLDL Cholesterol Cal: 34 mg/dL (ref 5–40)

## 2022-05-06 LAB — HEPATIC FUNCTION PANEL
ALT: 17 IU/L (ref 0–44)
AST: 17 IU/L (ref 0–40)
Albumin: 4.3 g/dL (ref 3.9–4.9)
Alkaline Phosphatase: 80 IU/L (ref 44–121)
Bilirubin Total: <0.2 mg/dL (ref 0.0–1.2)
Bilirubin, Direct: <0.1 mg/dL (ref 0.00–0.40)
Total Protein: 6.7 g/dL (ref 6.0–8.5)

## 2022-05-07 ENCOUNTER — Telehealth: Payer: Self-pay | Admitting: Family Medicine

## 2022-05-07 DIAGNOSIS — E1159 Type 2 diabetes mellitus with other circulatory complications: Secondary | ICD-10-CM

## 2022-05-07 NOTE — Telephone Encounter (Signed)
Nurses Patient recently sent a questionnaire back to [place] regarding his headaches He is having frequent headaches We would recommend blood work to look at CRP and sed rate because of temporal headaches to make sure he is not having temporal arteritis If he would like to be seen by neurology we can do a referral back to them If he would rather try Topamax to see if this might help the headaches please let us know in the meantime Please let the patient know what he would like for [place] to do at this point  he can take Tylenol for these headaches Thanks Please keep all regular follow-up visits

## 2022-05-08 NOTE — Telephone Encounter (Signed)
Patient left a message that he was wondering if we had heard anything back form the paper work that was submitted for his Ozempic. He states that he left it about 2 weeks ago. Patient is out of the Ozempic 1 mg.  Patient called and advised that I would follow up with the one of the other nurses , to see if she had heard anything. We would call him back.

## 2022-05-08 NOTE — Telephone Encounter (Signed)
Regarding this-I have not seen anything Not sure of the situation (Clinical pharmacist is coming on Wednesday-perhaps nurses can bring this to their attention if they need assistance with these papers)

## 2022-05-10 ENCOUNTER — Telehealth: Payer: Self-pay | Admitting: Neurology

## 2022-05-10 NOTE — Telephone Encounter (Signed)
05/10/22 left VM [place] 05/09/22 [employer] medicare no auth req EE

## 2022-05-11 ENCOUNTER — Other Ambulatory Visit: Payer: Self-pay | Admitting: Neurology

## 2022-05-11 NOTE — Telephone Encounter (Signed)
Referral ordered in [facility]. 

## 2022-05-11 NOTE — Telephone Encounter (Signed)
May put in a referral to [facility] clinical pharmacist with [facility]-specifically regarding apparently he was getting this medication provided to him that he needs some sort of paperwork to help keep getting it

## 2022-05-12 ENCOUNTER — Telehealth: Payer: Self-pay

## 2022-05-12 NOTE — Progress Notes (Signed)
   Care Guide Note  05/12/2022 Name: Paul Baker MRN: [id number] DOB: 1950-09-26  Referred by: [name], MD Reason for referral : Care Coordination (Outreach to schedule with Pharm d )   Paul Baker is a 72 y.o. year old male who is a primary care patient of Luking, Jonna Coup, MD. Paul Baker was referred to the pharmacist for assistance related to HTN and DM.    Successful contact was made with the patient to discuss pharmacy services including being ready for the pharmacist to call at least 5 minutes before the scheduled appointment time, to have medication bottles and any blood sugar or blood pressure readings ready for review. The patient agreed to meet with the pharmacist via with the pharmacist via telephone visit on (date/time).  05/31/2022  Paul Baker, RMA Care Guide [facility]  [place], [place] [address] Direct Dial: [phone] [email or web address]

## 2022-05-15 ENCOUNTER — Other Ambulatory Visit: Payer: Self-pay | Admitting: *Deleted

## 2022-05-15 DIAGNOSIS — G2581 Restless legs syndrome: Secondary | ICD-10-CM

## 2022-05-15 NOTE — Telephone Encounter (Signed)
Pt last seen on 05/04/22 per note  Follow up scheduled on 11/15/22   Last filled on 05/11/22 # 90 tablets (90 day supply)

## 2022-05-16 ENCOUNTER — Other Ambulatory Visit: Payer: Self-pay | Admitting: Pulmonary Disease

## 2022-05-18 DIAGNOSIS — M79641 Pain in right hand: Secondary | ICD-10-CM | POA: Diagnosis not present

## 2022-05-18 DIAGNOSIS — M25531 Pain in right wrist: Secondary | ICD-10-CM | POA: Diagnosis not present

## 2022-05-19 ENCOUNTER — Ambulatory Visit: Payer: Medicare HMO | Attending: Nurse Practitioner | Admitting: Nurse Practitioner

## 2022-05-19 ENCOUNTER — Encounter: Payer: Self-pay | Admitting: Nurse Practitioner

## 2022-05-19 VITALS — BP 128/76 | HR 73 | Ht 65.0 in | Wt 227.4 lb

## 2022-05-19 DIAGNOSIS — I10 Essential (primary) hypertension: Secondary | ICD-10-CM | POA: Diagnosis not present

## 2022-05-19 DIAGNOSIS — E785 Hyperlipidemia, unspecified: Secondary | ICD-10-CM

## 2022-05-19 DIAGNOSIS — R0609 Other forms of dyspnea: Secondary | ICD-10-CM

## 2022-05-19 DIAGNOSIS — I779 Disorder of arteries and arterioles, unspecified: Secondary | ICD-10-CM

## 2022-05-19 DIAGNOSIS — R058 Other specified cough: Secondary | ICD-10-CM | POA: Diagnosis not present

## 2022-05-19 DIAGNOSIS — I480 Paroxysmal atrial fibrillation: Secondary | ICD-10-CM | POA: Diagnosis not present

## 2022-05-19 DIAGNOSIS — I25119 Atherosclerotic heart disease of native coronary artery with unspecified angina pectoris: Secondary | ICD-10-CM

## 2022-05-19 MED ORDER — METOPROLOL SUCCINATE ER 25 MG PO TB24: 25.0000 mg | ORAL_TABLET | Freq: Two times a day (BID) | ORAL | 1 refills | Status: DC

## 2022-05-19 MED ORDER — NITROGLYCERIN 0.4 MG SL SUBL: SUBLINGUAL_TABLET | SUBLINGUAL | 3 refills | Status: AC

## 2022-05-19 MED ORDER — ISOSORBIDE MONONITRATE ER 30 MG PO TB24: 30.0000 mg | ORAL_TABLET | Freq: Every day | ORAL | 1 refills | Status: DC

## 2022-05-19 NOTE — Progress Notes (Signed)
I connected with  Paul Baker on 05/05/22 by a audio enabled telemedicine application and verified that I am speaking with the correct person using two identifiers.  Patient Location: Home  Provider Location: Office/Clinic  I discussed the limitations of evaluation and management by telemedicine. The patient expressed understanding and agreed to proceed.

## 2022-05-19 NOTE — Patient Instructions (Addendum)
Medication Instructions:   Increase Imdur to 30mg  daily  Use the Nitroglycerin only as needed - refilled today  Stop Lopressor (Metoprolol Tart) Begin Toprol XL 25mg  twice a day, may use extra tab as needed for prolonged palpitations  Continue all other medications.     Labwork:  none  Testing/Procedures:  none  Follow-Up:  1 month   Any Other Special Instructions Will Be Listed Below (If Applicable).  Make appointment with Dr. Craige Cotta for evaluation of productive cough, may have some bronchitis.  If you need a refill on your cardiac medications before your next appointment, please call your pharmacy.

## 2022-05-19 NOTE — Progress Notes (Unsigned)
Office Visit    Patient Name: Paul Baker Date of Encounter: 05/19/2022  PCP:  [name], MD   [facility]  Cardiologist:  Nona Dell, MD  Advanced Practice Provider:  No care team member to display Electrophysiologist:  None   Chief Complaint    Paul Baker is a 72 y.o. male with a hx of CAD, carotid artery disease, hypertension, type 2 diabetes, PAF, obesity, and asthma, who presents today for evaluation of dyspnea on exertion follow-up.   Past Medical History    Past Medical History:  Diagnosis Date   Arthritis    Asthma    Atrial fibrillation [facility])    Diagnosed December 2021   Colon polyp    Coronary atherosclerosis of native coronary artery    a. 2011: cath showing 90% stenosis along small non-dominant RCA (too small for PCI). b. 01/2018: cath showing nonobstructive CAD with 60 to 70% proximal to mid nondominant RCA stenosis, 50% mid LAD and 40 to 50% OM1   DVT (deep venous thrombosis) (HCC) 2005   Right arm   Essential hypertension    Headache    History of transfusion    Hypothyroidism    Mixed hyperlipidemia    Morbid obesity (HCC)    MRSA (methicillin resistant staph aureus) culture positive    08/2012   Narcolepsy    OSA (obstructive sleep apnea)    CPAP   Osteoarthritis    Pneumonia    Psoriasis    Pulmonary embolism (HCC) 2004   RLS (restless legs syndrome)    Rotator cuff disorder    Left   Septic arthritis of knee, left (HCC)    Skin cancer, basal cell    Type 2 diabetes mellitus (HCC)    Past Surgical History:  Procedure Laterality Date   BACK SURGERY     BIOPSY  01/31/2022   Procedure: BIOPSY;  Surgeon: Lynann Bologna, DO;  Location: [facility];  Service: Gastroenterology;;   CATARACT EXTRACTION Bilateral    COLONOSCOPY     COLONOSCOPY WITH PROPOFOL N/A 01/31/2022   Procedure: COLONOSCOPY WITH PROPOFOL;  Surgeon: Lynann Bologna, DO;  Location: [facility];  Service: Gastroenterology;   Laterality: N/A;   CYST REMOVAL TRUNK     from back   ESOPHAGOGASTRODUODENOSCOPY (EGD) WITH PROPOFOL N/A 01/31/2022   Procedure: ESOPHAGOGASTRODUODENOSCOPY (EGD) WITH PROPOFOL;  Surgeon: Lynann Bologna, DO;  Location: [facility];  Service: Gastroenterology;  Laterality: N/A;   EYE SURGERY Left 2016   laser to left eye   HIP SURGERY     bone removed from both sides of hip   KNEE ARTHROTOMY Right 12/04/2014   Procedure: KNEE ARTHROTOMY PATELLA LIGAMENT RECONSTRUSION AND REPAIR RIGHT KNEE;  Surgeon: Durene Romans, MD;  Location: [facility];  Service: Orthopedics;  Laterality: Right;   KNEE SURGERY     X 25 TIMES   LEFT HEART CATH AND CORONARY ANGIOGRAPHY N/A 01/17/2018   Procedure: LEFT HEART CATH AND CORONARY ANGIOGRAPHY;  Surgeon: Lyn Records, MD;  Location: [facility];  Service: Cardiovascular;  Laterality: N/A;   LUMBAR DISC SURGERY     Left L3, L4, L5 discecotomy with decompression of L4 root   POLYPECTOMY  01/31/2022   Procedure: POLYPECTOMY;  Surgeon: Lynann Bologna, DO;  Location: [facility];  Service: Gastroenterology;;   TONSILLECTOMY     TOTAL KNEE ARTHROPLASTY  2003   LEFT   TOTAL KNEE ARTHROPLASTY Right 03/23/2014   Procedure: RIGHT TOTAL  KNEE ARTHROPLASTY AND REMOVAL RIGHT TIBIAL  DEEP IMPLANT STAPLE;  Surgeon: Durene Romans, MD;  Location: [facility];  Service: Orthopedics;  Laterality: Right;   TOTAL KNEE REVISION  2005   LEFT   WRIST SURGERY      Allergies  Allergies  Allergen Reactions   Tape Rash    Pulls off skin   Farxiga [Dapagliflozin]     Yeast infections, felt sick with it   Cardizem [Diltiazem Hcl]     Edema    Cardura [Doxazosin Mesylate]     Headaches / cramps   Crestor [Rosuvastatin] Other (See Comments)    Leg cramps   Lipitor [Atorvastatin] Other (See Comments)    Leg cramps   Paxil [Paroxetine Hcl]     Unknown reaction    Codeine Rash and Other (See Comments)    Headache    Gabapentin Rash    History of Present Illness     Paul Baker is a 72 y.o. male with a PMH as mentioned above.  Last seen by Dr. Diona Browner on October 05, 2021.  Was overall doing well from a cardiac perspective at the time.  Seen by PCP this week for dyspnea on exertion and pedal edema.  Lasix dosage was adjusted.  Was encouraged to increase Lasix to 2 tablets in the morning and 2 in the afternoon.  BNP level normal.  Labs overall stable, elevated blood sugar level.  Kidney function stable.   I last saw him for follow-up on 04/07/2022. Admitted to DOE with minimal exertion. Denied any chest pain, palpitations, syncope, presyncope, dizziness, orthopnea, PND, swelling or significant weight changes, acute bleeding, or claudication. Echo and CCTA arranged and results noted below.   Today he presents for follow-up. Right wrist is in a brace. Says he fell and tripped over a rug yesterday.  Says he has his good days and his bad days when it comes to his health.  Concerned about his productive cough, denies any hemoptysis, but says sputum is thick/sticky.  He does admit to sharp chest pain, says it has been ongoing for couple months.  Initially he noticed it with exertion, states it is now being noticed at rest, described as left-sided, says sometimes can last for several hours, denies any pleuritic component. Denies any active CP. Has been taking nitroglycerin tablets that seem to help, symptoms are becoming more frequent but stable over time per his report.  He also notices he has been having more A-fib episodes since metoprolol medication was adjusted at last office visit, he is requesting to go back to his previous metoprolol that he was on. DOE is stable. Denies any syncope, presyncope, dizziness, orthopnea, PND, swelling or significant weight changes, acute bleeding, or claudication.  EKGs/Labs/Other Studies Reviewed:   The following studies were reviewed today:   EKG:  EKG is not ordered today.    Echocardiogram 05/03/2022: 1. Left ventricular  ejection fraction, by estimation, is 60 to 65%. The  left ventricle has normal function. The left ventricle has no regional  wall motion abnormalities. There is mild left ventricular hypertrophy.  Left ventricular diastolic parameters  are consistent with Grade I diastolic dysfunction (impaired relaxation).  The average left ventricular global longitudinal strain is -20.4 %. The  global longitudinal strain is normal.   2. Right ventricular systolic function is normal. The right ventricular  size is normal. Tricuspid regurgitation signal is inadequate for assessing  PA pressure.   3. Left atrial size was mildly dilated.   4. A  small pericardial effusion is present. The pericardial effusion is  posterior to the left ventricle.   5. The mitral valve is grossly normal. No evidence of mitral valve  regurgitation.   6. The aortic valve is tricuspid. Aortic valve regurgitation is trivial.  Aortic valve sclerosis is present, with no evidence of aortic valve  stenosis. Aortic valve mean gradient measures 7.0 mmHg.   7. The inferior vena cava is normal in size with greater than 50%  respiratory variability, suggesting right atrial pressure of 3 mmHg.   Comparison(s): Prior images reviewed side by side. LVEF remains normal  range at 60-65%.  Coronary CTA:  IMPRESSION: 1. Coronary calcium score of 569. This was 75th percentile for age, sex, and race matched control.   2. Normal coronary origin with left dominance.   3. CAD-RADS 3. Moderate stenosis. Consider symptom-guided anti-ischemic pharmacotherapy as well as risk factor modification per guideline directed care. Additional analysis with CT FFR will be submitted.  4.  CT FFR analysis shows no evidence of significant functional stenosis.  5.  Right lower lobe subsegmental atelectasis or consolidation.  Echo 08/2019:   1. Left ventricular ejection fraction, by estimation, is 55 to 60%. The  left ventricle has normal function. The left  ventricle has no regional  wall motion abnormalities. Left ventricular diastolic parameters were  normal.   2. Right ventricular systolic function is normal. The right ventricular  size is normal. Tricuspid regurgitation signal is inadequate for assessing  PA pressure.   3. Left atrial size was upper normal.   4. The mitral valve is grossly normal. Mild mitral valve regurgitation.   5. The aortic valve is tricuspid. Aortic valve regurgitation is not  visualized.   6. The inferior vena cava is normal in size with greater than 50%  respiratory variability, suggesting right atrial pressure of 3 mmHg.  LHC 01/2018: Nonobstructive coronary artery disease with 60 to 70% proximal to mid nondominant right coronary, 50% mid LAD, and 40 to 50% first obtuse marginal narrowing. Normal left ventricular systolic function with EF 60%. LVEDP is 17 mmHg. Failed right heart catheterization from right antecubital vein due to distal subclavian stenosis likely from prior PICC line.  Further attempts a right heart cath were aborted as IV heparin had been given for left heart catheterization.   RECOMMENDATIONS:   INOCA (ischemia with no obstructive coronary artery disease).  Aggressive risk factor modification including excellent lipid control. 2D Doppler echocardiogram to assess RV and estimate PA pressure.  Right heart cath from femoral approach her left arm could be attempted in the future if that information is absolutely needed. He is not compliant with CPAP which may be the driver for his recent decrease in exertional tolerance and chest pain.  Myoview 11/2009: IMPRESSION:  Subtle area of Lexiscan induced partially reversible decreased  myocardial perfusion involving the inferior wall left ventricle.  Normal left ventricular ejection fraction of 56%.  Normal left ventricular wall motion.  Recent Labs: 12/06/2021: TSH 1.230 04/04/2022: BNP 44.2 04/13/2022: Magnesium 2.1 05/05/2022: ALT 17 05/21/2022: BUN  37; Creatinine, Ser 1.30; Hemoglobin 11.6; Platelets 226; Potassium 4.0; Sodium 130  Recent Lipid Panel    Component Value Date/Time   CHOL 150 05/05/2022 0938   TRIG 206 (H) 05/05/2022 0938   HDL 43 05/05/2022 0938   CHOLHDL 3.5 05/05/2022 0938   CHOLHDL 3.2 08/05/2019 0334   VLDL 19 08/05/2019 0334   LDLCALC 73 05/05/2022 0938   LDLCALC 118 (H) 02/06/2019 0813    Risk  Assessment/Calculations:   CHA2DS2-VASc Score = 4   This indicates a 4.8% annual risk of stroke. The patient's score is based upon: CHF History: 0 HTN History: 1 Diabetes History: 1 Stroke History: 0 Vascular Disease History: 1 Age Score: 1 Gender Score: 0    Home Medications   Current Meds  Medication Sig   acetaminophen (TYLENOL) 325 MG tablet Take 2 tablets (650 mg total) by mouth every 6 (six) hours as needed for mild pain (or Fever >/= 101).   albuterol (PROVENTIL) (2.5 MG/3ML) 0.083% nebulizer solution Take 3 mLs (2.5 mg total) by nebulization every 6 (six) hours as needed for up to 7 days for wheezing or shortness of breath.   albuterol (VENTOLIN HFA) 108 (90 Base) MCG/ACT inhaler Inhale 2 puffs into the lungs every 6 (six) hours as needed for wheezing or shortness of breath.   alfuzosin (UROXATRAL) 10 MG 24 hr tablet Take 10 mg by mouth daily.   Alpha Lipoic Acid-Biotin ER 600-450 MG-MCG TBCR Take 600 mg by mouth every morning.   apixaban (ELIQUIS) 5 MG TABS tablet Take 1 tablet (5 mg total) by mouth 2 (two) times daily. For stroke prevention   cyanocobalamin (VITAMIN B12) 500 MCG tablet Take 1 tablet (500 mcg total) by mouth daily.   ezetimibe (ZETIA) 10 MG tablet Take 1 tablet by mouth once daily   ferrous sulfate 325 (65 FE) MG EC tablet Take 1 tablet (325 mg total) by mouth daily with breakfast.   furosemide (LASIX) 40 MG tablet Take 2 tablets (80 mg total) by mouth in the morning. Take 40 mg in the evening   Glucagon (GVOKE HYPOPEN 2-PACK) 1 MG/0.2ML SOAJ Inject 1 mg subcutaneously once as  needed for low blood sugar. May repeat dose in 15 minutes as needed using a new device.   Insulin Pen Needle (B-D ULTRAFINE III SHORT PEN) 31G X 8 MM MISC USE 1 PEN NEEDLE THREE TIMES DAILY   insulin regular human CONCENTRATED (HUMULIN R U-500 KWIKPEN) 500 UNIT/ML KwikPen Inject 90 units under skin with breakfast and lunch , inject 75 units under skin with supper.   levothyroxine (SYNTHROID) 137 MCG tablet TAKE 1 TABLET BY MOUTH ONCE DAILY BEFORE BREAKFAST   loratadine (CLARITIN) 10 MG tablet Take 10 mg by mouth daily as needed for allergies.    losartan (COZAAR) 50 MG tablet Take by mouth.   modafinil (PROVIGIL) 200 MG tablet Take 1 tablet by mouth once daily   ONE TOUCH ULTRA TEST test strip TEST BLOOD SUGAR UP TO 4 TIMES DAILY.   ONETOUCH DELICA LANCETS 33G MISC USE AS DIRECTED TO TEST BLOOD SUGAR 4 TIMES DAILY.   pravastatin (PRAVACHOL) 40 MG tablet TAKE 1 TABLET BY MOUTH ONCE DAILY IN THE EVENING   rOPINIRole (REQUIP XL) 4 MG 24 hr tablet Take 1 tablet (4 mg total) by mouth at bedtime.   rOPINIRole (REQUIP) 4 MG tablet Take 0.5 tablets (2 mg total) by mouth 2 (two) times daily.   Semaglutide, 1 MG/DOSE, 4 MG/3ML SOPN Inject 1 mg as directed once a week.   TRELEGY ELLIPTA 100-62.5-25 MCG/ACT AEPB INHALE 1 PUFF ONCE DAILY    isosorbide mononitrate (IMDUR) 30 MG 24 hr tablet Take 0.5 tablets (15 mg total) by mouth daily.    metoprolol tartrate (LOPRESSOR) 25 MG tablet Take 1 tablet (25 mg total) by mouth 2 (two) times daily. May take an additional tablet daily as needed for palpitations.    nitroGLYCERIN (NITROSTAT) 0.4 MG SL tablet DISSOLVE 1 TABLET  UNDER TONGUE EVERY 5 MINUTES UP TO 15 MIN FOR CHESTPAIN. IF NO RELIEF CALL 911.    Review of Systems    All other systems reviewed and are otherwise negative except as noted above.  Physical Exam    VS:  BP 128/76   Pulse 73   Ht 5\' 5"  (1.651 m)   Wt 227 lb 6.4 oz (103.1 kg)   SpO2 96%   BMI 37.84 kg/m  , BMI Body mass index is 37.84  kg/m.  Wt Readings from Last 3 Encounters:  05/21/22 228 lb (103.4 kg)  05/19/22 227 lb 6.4 oz (103.1 kg)  05/05/22 230 lb (104.3 kg)     GEN: Obese, 72 y.o. male in no acute distress. HEENT: normal. Neck: Supple, no JVD, carotid bruits, or masses. Cardiac: S1/S2, RRR, no murmurs, rubs, or gallops. No clubbing, cyanosis, edema.  Radials/PT 2+ and equal bilaterally.  Respiratory:  Respirations regular and unlabored, clear to auscultation bilaterally. MS: Brace on right wrist, no deformity or atrophy. Skin: Warm and dry, no rash. Neuro:  Strength and sensation are intact. Psych: Normal affect.  Assessment & Plan    CAD Cath in 2020 revealed nonobstructive CAD, no hx of PCI/stents. Admits to continued DOE, does admit to chest pain. CCTA revealed moderate stenosis, negative FFR along mid LAD.  Will medically manage at this time.  Not on aspirin due to being on Plavix.  Continue Zetia, losartan, and will refill nitroglycerin.  Will increase Imdur to 30 mg daily.  Continue pravastatin and will return to Toprol XL 25 mg BID, see below. If no improvement in symptoms by next OV, consider discussing cardiac cath. Heart healthy diet encouraged. ED precautions discussed.  2. PAF Admits to palpitations. Will switch to metoprolol succinate 25 mg BID and may take extra tablet PRN for palpitations. No other medication changes. Heart healthy diet encouraged. Continue Eliquis, on appropriate dosage and denies any bleeding issues.   3. HTN BP stable. Discussed to monitor BP at home at least 2 hours after medications and sitting for 5-10 minutes. Changing metoprolol as mentioned above and increasing Imdur. Continue rest of medication regimen.  Heart healthy diet encouraged.  4. Carotid artery disease, HLD Recent LDL 73. Carotid Doppler in October 2023 revealed mild bilateral ICA stenosis, 1 to 39%.  Continue current medication regimen. Heart healthy diet encouraged.   5. DOE, productive cough Admits to  stable DOE, but admits to new productive cough. Has seen Dr. Craige Cotta before, and is due for follow-up. Recommended he follow-up and scheduled appt. May have some bronchitis/may need to do workup for COPD. ED precautions discussed. Continue to follow with PCP.  Disposition: Follow up in 1 month with Nona Dell, MD or APP.  Signed, Sharlene Dory, NP 05/21/2022, 10:51 PM [facility]

## 2022-05-21 ENCOUNTER — Emergency Department (HOSPITAL_COMMUNITY): Payer: Medicare HMO

## 2022-05-21 ENCOUNTER — Encounter (HOSPITAL_COMMUNITY): Payer: Self-pay

## 2022-05-21 ENCOUNTER — Emergency Department (HOSPITAL_COMMUNITY)
Admission: EM | Admit: 2022-05-21 | Discharge: 2022-05-21 | Disposition: A | Discharge status: Home / Self Care | Payer: Medicare HMO | Attending: Emergency Medicine | Admitting: Emergency Medicine

## 2022-05-21 ENCOUNTER — Other Ambulatory Visit: Payer: Self-pay

## 2022-05-21 DIAGNOSIS — I480 Paroxysmal atrial fibrillation: Secondary | ICD-10-CM | POA: Insufficient documentation

## 2022-05-21 DIAGNOSIS — Z6837 Body mass index (BMI) 37.0-37.9, adult: Secondary | ICD-10-CM | POA: Insufficient documentation

## 2022-05-21 DIAGNOSIS — S60221A Contusion of right hand, initial encounter: Secondary | ICD-10-CM

## 2022-05-21 DIAGNOSIS — E1165 Type 2 diabetes mellitus with hyperglycemia: Secondary | ICD-10-CM | POA: Diagnosis not present

## 2022-05-21 DIAGNOSIS — Z794 Long term (current) use of insulin: Secondary | ICD-10-CM | POA: Insufficient documentation

## 2022-05-21 DIAGNOSIS — E1140 Type 2 diabetes mellitus with diabetic neuropathy, unspecified: Secondary | ICD-10-CM | POA: Insufficient documentation

## 2022-05-21 DIAGNOSIS — L03113 Cellulitis of right upper limb: Secondary | ICD-10-CM

## 2022-05-21 DIAGNOSIS — Z7901 Long term (current) use of anticoagulants: Secondary | ICD-10-CM | POA: Diagnosis not present

## 2022-05-21 DIAGNOSIS — W010XXA Fall on same level from slipping, tripping and stumbling without subsequent striking against object, initial encounter: Secondary | ICD-10-CM | POA: Diagnosis not present

## 2022-05-21 DIAGNOSIS — I2510 Atherosclerotic heart disease of native coronary artery without angina pectoris: Secondary | ICD-10-CM | POA: Diagnosis not present

## 2022-05-21 DIAGNOSIS — Z79899 Other long term (current) drug therapy: Secondary | ICD-10-CM | POA: Insufficient documentation

## 2022-05-21 DIAGNOSIS — I10 Essential (primary) hypertension: Secondary | ICD-10-CM | POA: Diagnosis not present

## 2022-05-21 DIAGNOSIS — R739 Hyperglycemia, unspecified: Secondary | ICD-10-CM

## 2022-05-21 DIAGNOSIS — M79641 Pain in right hand: Secondary | ICD-10-CM | POA: Diagnosis present

## 2022-05-21 LAB — BLOOD GAS, VENOUS
Acid-base deficit: 0.5 mmol/L (ref 0.0–2.0)
Bicarbonate: 24.2 mmol/L (ref 20.0–28.0)
Drawn by: 1517
O2 Saturation: 80.9 %
Patient temperature: 37.3
pCO2, Ven: 39 mmHg — ABNORMAL LOW (ref 44–60)
pH, Ven: 7.4 (ref 7.25–7.43)
pO2, Ven: 44 mmHg (ref 32–45)

## 2022-05-21 LAB — BASIC METABOLIC PANEL
Anion gap: 11 (ref 5–15)
BUN: 37 mg/dL — ABNORMAL HIGH (ref 8–23)
CO2: 22 mmol/L (ref 22–32)
Calcium: 9.3 mg/dL (ref 8.9–10.3)
Chloride: 97 mmol/L — ABNORMAL LOW (ref 98–111)
Creatinine, Ser: 1.3 mg/dL — ABNORMAL HIGH (ref 0.61–1.24)
GFR, Estimated: 59 mL/min — ABNORMAL LOW (ref >60)
Glucose, Bld: 436 mg/dL — ABNORMAL HIGH (ref 70–99)
Potassium: 4 mmol/L (ref 3.5–5.1)
Sodium: 130 mmol/L — ABNORMAL LOW (ref 135–145)

## 2022-05-21 LAB — CBC WITH DIFFERENTIAL/PLATELET
Abs Immature Granulocytes: 0.05 10*3/uL (ref 0.00–0.07)
Basophils Absolute: 0.1 10*3/uL (ref 0.0–0.1)
Basophils Relative: 1 %
Eosinophils Absolute: 0.3 10*3/uL (ref 0.0–0.5)
Eosinophils Relative: 3 %
HCT: 35.2 % — ABNORMAL LOW (ref 39.0–52.0)
Hemoglobin: 11.6 g/dL — ABNORMAL LOW (ref 13.0–17.0)
Immature Granulocytes: 1 %
Lymphocytes Relative: 15 %
Lymphs Abs: 1.3 10*3/uL (ref 0.7–4.0)
MCH: 29.4 pg (ref 26.0–34.0)
MCHC: 33 g/dL (ref 30.0–36.0)
MCV: 89.1 fL (ref 80.0–100.0)
Monocytes Absolute: 1 10*3/uL (ref 0.1–1.0)
Monocytes Relative: 12 %
Neutro Abs: 6 10*3/uL (ref 1.7–7.7)
Neutrophils Relative %: 68 %
Platelets: 226 10*3/uL (ref 150–400)
RBC: 3.95 MIL/uL — ABNORMAL LOW (ref 4.22–5.81)
RDW: 14.1 % (ref 11.5–15.5)
WBC: 8.6 10*3/uL (ref 4.0–10.5)
nRBC: 0 % (ref 0.0–0.2)

## 2022-05-21 LAB — CBG MONITORING, ED
Glucose-Capillary: 419 mg/dL — ABNORMAL HIGH (ref 70–99)
Glucose-Capillary: 445 mg/dL — ABNORMAL HIGH (ref 70–99)
Glucose-Capillary: 381 mg/dL — ABNORMAL HIGH (ref 70–99)
Glucose-Capillary: 265 mg/dL — ABNORMAL HIGH (ref 70–99)

## 2022-05-21 MED ORDER — INSULIN ASPART 100 UNIT/ML IJ SOLN
15.0000 [IU] | Freq: Once | INTRAMUSCULAR | Status: AC
  Administered 2022-05-21: 15 [IU] via SUBCUTANEOUS

## 2022-05-21 MED ORDER — OXYCODONE-ACETAMINOPHEN 5-325 MG PO TABS: 1.0000 | ORAL_TABLET | Freq: Four times a day (QID) | ORAL | 0 refills | Status: DC | PRN

## 2022-05-21 MED ORDER — OXYCODONE-ACETAMINOPHEN 5-325 MG PO TABS
1.0000 | ORAL_TABLET | Freq: Once | ORAL | Status: AC
  Administered 2022-05-21: 1 via ORAL
  Filled 2022-05-21: qty 1

## 2022-05-21 MED ORDER — SODIUM CHLORIDE 0.9 % IV BOLUS
1000.0000 mL | Freq: Once | INTRAVENOUS | Status: AC
  Administered 2022-05-21: 1000 mL via INTRAVENOUS

## 2022-05-21 MED ORDER — INSULIN ASPART 100 UNIT/ML IJ SOLN
0.0000 [IU] | INTRAMUSCULAR | Status: DC
  Filled 2022-05-21: qty 1

## 2022-05-21 MED ORDER — INSULIN ASPART 100 UNIT/ML IJ SOLN: 20.0000 [IU] | Freq: Once | INTRAMUSCULAR | Status: DC

## 2022-05-21 MED ORDER — INSULIN ASPART 100 UNIT/ML IJ SOLN
15.0000 [IU] | Freq: Once | INTRAMUSCULAR | Status: AC
  Administered 2022-05-21: 15 [IU] via SUBCUTANEOUS
  Filled 2022-05-21: qty 1

## 2022-05-21 MED ORDER — CEPHALEXIN 500 MG PO CAPS
500.0000 mg | ORAL_CAPSULE | Freq: Once | ORAL | Status: AC
  Administered 2022-05-21: 500 mg via ORAL
  Filled 2022-05-21: qty 1

## 2022-05-21 MED ORDER — CEPHALEXIN 500 MG PO CAPS: 500.0000 mg | ORAL_CAPSULE | Freq: Four times a day (QID) | ORAL | 0 refills | Status: DC

## 2022-05-21 NOTE — ED Triage Notes (Signed)
Pt reports he tripped over a throw rug on Thursday and injured his right hand and fingers and was seen at [facility] and was told it was not broken.  Pt reports hand is now swollen, red and hot to the touch and his blood sugar is elevated.

## 2022-05-21 NOTE — ED Provider Notes (Signed)
[place] EMERGENCY DEPARTMENT AT [facility] Provider Note   CSN: [id number] Arrival date & time: 05/21/22  1732     History  Chief Complaint  Patient presents with   Hand Pain    MICKEY NELAN is a 72 y.o. male with past medical history significant for hyperglycemia, hypertension, coronary artery disease, morbid obesity, diabetic Neuropathy, paroxysmal A-fib who presents with concern for ongoing right hand pain with new redness, swelling.  Patient reports that he tripped over a rug and fell forward.  Patient reports pain with flexion, extension of the fingers.  He was seen at urgent care and told that it was not broken last week.  Patient also with increased weakness, urination, elevated blood sugar.  Patient reports that he has been taking his Humulin at home, he takes 90 units with breakfast, lunch, and 75 units with dinner, does not take any long-acting insulin.   Hand Pain       Home Medications Prior to Admission medications   Medication Sig Start Date End Date Taking? Authorizing Provider  cephALEXin (KEFLEX) 500 MG capsule Take 1 capsule (500 mg total) by mouth 4 (four) times daily. 05/21/22  Yes Meloney Feld H, PA-C  oxyCODONE-acetaminophen (PERCOCET/ROXICET) 5-325 MG tablet Take 1 tablet by mouth every 6 (six) hours as needed for severe pain. 05/21/22  Yes Sagan Maselli H, PA-C  acetaminophen (TYLENOL) 325 MG tablet Take 2 tablets (650 mg total) by mouth every 6 (six) hours as needed for mild pain (or Fever >/= 101). 12/05/19   Shon Hale, MD  albuterol (PROVENTIL) (2.5 MG/3ML) 0.083% nebulizer solution Take 3 mLs (2.5 mg total) by nebulization every 6 (six) hours as needed for up to 7 days for wheezing or shortness of breath. 12/18/20   Carroll Sage, PA-C  albuterol (VENTOLIN HFA) 108 (90 Base) MCG/ACT inhaler Inhale 2 puffs into the lungs every 6 (six) hours as needed for wheezing or shortness of breath. 06/03/20   Parrett, Virgel Bouquet, NP   alfuzosin (UROXATRAL) 10 MG 24 hr tablet Take 10 mg by mouth daily. 03/02/21   [provider]  Alpha Lipoic Acid-Biotin ER 600-450 MG-MCG TBCR Take 600 mg by mouth every morning. 05/04/22   Dohmeier, Porfirio Mylar, MD  apixaban (ELIQUIS) 5 MG TABS tablet Take 1 tablet (5 mg total) by mouth 2 (two) times daily. For stroke prevention 04/07/22   Sharlene Dory, NP  cyanocobalamin (VITAMIN B12) 500 MCG tablet Take 1 tablet (500 mcg total) by mouth daily. 05/04/22   Briant Cedar, PA-C  ezetimibe (ZETIA) 10 MG tablet Take 1 tablet by mouth once daily 04/17/22   [name], MD  ferrous sulfate 325 (65 FE) MG EC tablet Take 1 tablet (325 mg total) by mouth daily with breakfast. 05/04/22   Briant Cedar, PA-C  furosemide (LASIX) 40 MG tablet Take 2 tablets (80 mg total) by mouth in the morning. Take 40 mg in the evening 04/07/22   Sharlene Dory, NP  Glucagon (GVOKE HYPOPEN 2-PACK) 1 MG/0.2ML SOAJ Inject 1 mg subcutaneously once as needed for low blood sugar. May repeat dose in 15 minutes as needed using a new device. 01/28/21   [name], MD  Insulin Pen Needle (B-D ULTRAFINE III SHORT PEN) 31G X 8 MM MISC USE 1 PEN NEEDLE THREE TIMES DAILY 01/19/22   Dani Gobble, NP  insulin regular human CONCENTRATED (HUMULIN R U-500 KWIKPEN) 500 UNIT/ML KwikPen Inject 90 units under skin with breakfast and lunch , inject  75 units under skin with supper. 04/03/22   Dani Gobble, NP  isosorbide mononitrate (IMDUR) 30 MG 24 hr tablet Take 1 tablet (30 mg total) by mouth daily. 05/19/22 11/15/22  Sharlene Dory, NP  levothyroxine (SYNTHROID) 137 MCG tablet TAKE 1 TABLET BY MOUTH ONCE DAILY BEFORE BREAKFAST 03/28/22   Dani Gobble, NP  loratadine (CLARITIN) 10 MG tablet Take 10 mg by mouth daily as needed for allergies.     [provider]  losartan (COZAAR) 50 MG tablet Take by mouth. 08/09/15   [provider]  metoprolol succinate (TOPROL XL) 25 MG 24 hr tablet Take 1 tablet (25 mg  total) by mouth 2 (two) times daily. 05/19/22   Sharlene Dory, NP  modafinil (PROVIGIL) 200 MG tablet Take 1 tablet by mouth once daily 05/15/22   Dohmeier, Porfirio Mylar, MD  nitroGLYCERIN (NITROSTAT) 0.4 MG SL tablet DISSOLVE 1 TABLET UNDER TONGUE EVERY 5 MINUTES UP TO 15 MIN FOR CHESTPAIN. IF NO RELIEF CALL 911. 05/19/22   Sharlene Dory, NP  ONE TOUCH ULTRA TEST test strip TEST BLOOD SUGAR UP TO 4 TIMES DAILY. 10/10/16   Roma Kayser, MD  ONETOUCH DELICA LANCETS 33G MISC USE AS DIRECTED TO TEST BLOOD SUGAR 4 TIMES DAILY. 10/10/16   Roma Kayser, MD  pravastatin (PRAVACHOL) 40 MG tablet TAKE 1 TABLET BY MOUTH ONCE DAILY IN THE EVENING 12/27/21   Jonelle Sidle, MD  rOPINIRole (REQUIP XL) 4 MG 24 hr tablet Take 1 tablet (4 mg total) by mouth at bedtime. 06/01/21   Lomax, Amy, NP  rOPINIRole (REQUIP) 4 MG tablet Take 0.5 tablets (2 mg total) by mouth 2 (two) times daily. 11/02/21   Dohmeier, Porfirio Mylar, MD  Semaglutide, 1 MG/DOSE, 4 MG/3ML SOPN Inject 1 mg as directed once a week. 03/07/21   Dani Gobble, NP  TRELEGY ELLIPTA 100-62.5-25 MCG/ACT AEPB INHALE 1 PUFF ONCE DAILY 04/19/22   Coralyn Helling, MD      Allergies    Tape, Marcelline Deist [dapagliflozin], Cardizem [diltiazem hcl], Cardura [doxazosin mesylate], Crestor [rosuvastatin], Lipitor [atorvastatin], Paxil [paroxetine hcl], Codeine, and Gabapentin    Review of Systems   Review of Systems  All other systems reviewed and are negative.   Physical Exam Updated Vital Signs BP (!) 147/78 (BP Location: Right Arm)   Pulse 81   Temp 98.9 F (37.2 C)   Resp 16   Ht 5\' 5"  (1.651 m)   Wt 103.4 kg   SpO2 98%   BMI 37.94 kg/m  Physical Exam Vitals and nursing note reviewed.  Constitutional:      General: He is not in acute distress.    Appearance: Normal appearance.  HENT:     Head: Normocephalic and atraumatic.  Eyes:     General:        Right eye: No discharge.        Left eye: No discharge.  Cardiovascular:     Rate and  Rhythm: Normal rate and regular rhythm.     Heart sounds: No murmur heard.    No friction rub. No gallop.  Pulmonary:     Effort: Pulmonary effort is normal.     Breath sounds: Normal breath sounds.  Abdominal:     General: Bowel sounds are normal.     Palpations: Abdomen is soft.  Musculoskeletal:     Comments: Patient with some focal redness, swelling over the third, fourth MTP joints.  He has some pain with flexion, extension of the fingers but is  able to flex and extend through full range of motion.  He has significant tenderness in the mid hand.  Skin:    General: Skin is warm and dry.     Capillary Refill: Capillary refill takes less than 2 seconds.  Neurological:     Mental Status: He is alert and oriented to person, place, and time.  Psychiatric:        Mood and Affect: Mood normal.        Behavior: Behavior normal.     ED Results / Procedures / Treatments   Labs (all labs ordered are listed, but only abnormal results are displayed) Labs Reviewed  BLOOD GAS, VENOUS - Abnormal; Notable for the following components:      Result Value   pCO2, Ven 39 (*)    All other components within normal limits  BASIC METABOLIC PANEL - Abnormal; Notable for the following components:   Sodium 130 (*)    Chloride 97 (*)    Glucose, Bld 436 (*)    BUN 37 (*)    Creatinine, Ser 1.30 (*)    GFR, Estimated 59 (*)    All other components within normal limits  CBC WITH DIFFERENTIAL/PLATELET - Abnormal; Notable for the following components:   RBC 3.95 (*)    Hemoglobin 11.6 (*)    HCT 35.2 (*)    All other components within normal limits  CBG MONITORING, ED - Abnormal; Notable for the following components:   Glucose-Capillary 419 (*)    All other components within normal limits  CBG MONITORING, ED - Abnormal; Notable for the following components:   Glucose-Capillary 445 (*)    All other components within normal limits  CBG MONITORING, ED - Abnormal; Notable for the following  components:   Glucose-Capillary 381 (*)    All other components within normal limits  CBG MONITORING, ED - Abnormal; Notable for the following components:   Glucose-Capillary 265 (*)    All other components within normal limits    EKG None  Radiology DG Hand Complete Right  Result Date: 05/21/2022 CLINICAL DATA:  RIGHT hand pain following fall. EXAM: RIGHT HAND - COMPLETE 3+ VIEW COMPARISON:  None Available. FINDINGS: Posttraumatic/surgical fusion changes throughout the carpus noted. Internal plate and screw fixation extending from the distal radius to the proximal 2nd metacarpal noted. There is no evidence of acute fracture or dislocation. Soft tissue swelling is noted. IMPRESSION: 1. Soft tissue swelling without evidence of acute bony abnormality. 2. Posttraumatic/surgical fusion changes throughout the carpus. Electronically Signed   By: Harmon Pier M.D.   On: 05/21/2022 18:33    Procedures Procedures    Medications Ordered in ED Medications  insulin aspart (novoLOG) injection 0-15 Units ( Subcutaneous Not Given 05/21/22 1952)  sodium chloride 0.9 % bolus 1,000 mL (0 mLs Intravenous Stopped 05/21/22 2011)  insulin aspart (novoLOG) injection 15 Units (15 Units Subcutaneous Given 05/21/22 1848)  insulin aspart (novoLOG) injection 15 Units (15 Units Subcutaneous Given 05/21/22 1947)  cephALEXin (KEFLEX) capsule 500 mg (500 mg Oral Given 05/21/22 2202)  oxyCODONE-acetaminophen (PERCOCET/ROXICET) 5-325 MG per tablet 1 tablet (1 tablet Oral Given 05/21/22 2202)    ED Course/ Medical Decision Making/ A&P                             Medical Decision Making Amount and/or Complexity of Data Reviewed Labs: ordered. Radiology: ordered.  Risk Prescription drug management.   This patient is a 72 y.o.  male  who presents to the ED for concern of hand pain, hyperglycemia.   Differential diagnoses prior to evaluation: The emergent differential diagnosis includes, but is not limited to, acute  fracture, dislocation, contusion, cellulitis, hyperglycemia, DKA, HHS, versus other. This is not an exhaustive differential.   Past Medical History / Co-morbidities: Diabetes, hypertension, obesity, obstructive sleep apnea  Physical Exam: Physical exam performed. The pertinent findings include:  Patient with some focal redness, swelling over the third, fourth MTP joints.  He has some pain with flexion, extension of the fingers but is able to flex and extend through full range of motion.  He has significant tenderness in the mid hand.   Lab Tests/Imaging studies: I personally interpreted labs/imaging and the pertinent results include: CBC with no significant leukocytosis, anemia stable compared to baseline.  He does have significant hyperglycemia, glucose 436 on arrival, he has a pseudohyponatremia.  Glucose improved to 265 after multiple subcutaneous insulin.  As patient is compliant with his diabetes, reports it is normally well-controlled I am suspicious that is elevated in context of developing infection of the right hand.  And family interpreted plain film x-ray of the right hand which shows no evidence of acute fracture, dislocation I agree with the radiologist interpretation.   Medications: I ordered medication including insulin for hyperglycemia, Keflex for developing infection, Percocet for pain.  I have reviewed the patients home medicines and have made adjustments as needed.   Disposition: After consideration of the diagnostic results and the patients response to treatment, I feel that patient stable for discharge, discharged with short course of pain medication as well as antibiotics, encourage close PCP follow-up, endocrinology follow-up to ensure that his blood sugar remains stable.   emergency department workup does not suggest an emergent condition requiring admission or immediate intervention beyond what has been performed at this time. The plan is: as above. The patient is safe for  discharge and has been instructed to return immediately for worsening symptoms, change in symptoms or any other concerns.  Final Clinical Impression(s) / ED Diagnoses Final diagnoses:  Contusion of right hand, initial encounter  Cellulitis of right hand  Hyperglycemia    Rx / DC Orders ED Discharge Orders          Ordered    oxyCODONE-acetaminophen (PERCOCET/ROXICET) 5-325 MG tablet  Every 6 hours PRN        05/21/22 2147    cephALEXin (KEFLEX) 500 MG capsule  4 times daily        05/21/22 2147              [place] 05/21/22 2220    Eber Hong, MD 05/21/22 2315

## 2022-05-22 ENCOUNTER — Other Ambulatory Visit: Payer: Self-pay | Admitting: *Deleted

## 2022-05-22 ENCOUNTER — Telehealth: Payer: Self-pay | Admitting: Nurse Practitioner

## 2022-05-22 ENCOUNTER — Other Ambulatory Visit: Payer: Self-pay | Admitting: Nurse Practitioner

## 2022-05-22 DIAGNOSIS — M79641 Pain in right hand: Secondary | ICD-10-CM | POA: Diagnosis not present

## 2022-05-22 DIAGNOSIS — E1159 Type 2 diabetes mellitus with other circulatory complications: Secondary | ICD-10-CM

## 2022-05-22 MED ORDER — DEXCOM G7 SENSOR MISC
3 refills | Status: AC
Start: 2022-05-22 — End: ?

## 2022-05-22 MED ORDER — FIASP FLEXTOUCH 100 UNIT/ML ~~LOC~~ SOPN: 30.0000 [IU] | PEN_INJECTOR | Freq: Three times a day (TID) | SUBCUTANEOUS | 3 refills | Status: DC

## 2022-05-22 MED ORDER — TOUJEO MAX SOLOSTAR 300 UNIT/ML ~~LOC~~ SOPN: 120.0000 [IU] | PEN_INJECTOR | Freq: Every evening | SUBCUTANEOUS | 3 refills | Status: DC

## 2022-05-22 NOTE — Telephone Encounter (Signed)
Pt needs a refill on his G7 Sensor [facility] in [place]

## 2022-05-22 NOTE — Telephone Encounter (Signed)
A RX has been sent to the [facility].

## 2022-05-22 NOTE — Progress Notes (Signed)
Patient came by and says his insurance will no longer cover U500.  I did change him to Toujeo 120 units SQ nightly and Fiasp 30-36 units TID with meals.  He is already established with PAP for Ozempic, thus may switch to [place] and Novolog if he can get it from 1 company moving forward.  Will ask Selena Batten for assistance in getting that paperwork to [employer].

## 2022-05-22 NOTE — Progress Notes (Signed)
I sent in Dexcom G7 order through [employer].

## 2022-05-23 ENCOUNTER — Encounter: Payer: Self-pay | Admitting: Family Medicine

## 2022-05-23 ENCOUNTER — Ambulatory Visit (INDEPENDENT_AMBULATORY_CARE_PROVIDER_SITE_OTHER): Payer: Medicare HMO | Admitting: Family Medicine

## 2022-05-23 VITALS — BP 120/62 | HR 82 | Temp 98.1°F | Ht 65.0 in | Wt 225.0 lb

## 2022-05-23 DIAGNOSIS — E1165 Type 2 diabetes mellitus with hyperglycemia: Secondary | ICD-10-CM | POA: Diagnosis not present

## 2022-05-23 DIAGNOSIS — L03113 Cellulitis of right upper limb: Secondary | ICD-10-CM | POA: Diagnosis not present

## 2022-05-23 MED ORDER — DOXYCYCLINE HYCLATE 100 MG PO TABS: 100.0000 mg | ORAL_TABLET | Freq: Two times a day (BID) | ORAL | 0 refills | Status: DC

## 2022-05-23 MED ORDER — AMOXICILLIN-POT CLAVULANATE 875-125 MG PO TABS: 1.0000 | ORAL_TABLET | Freq: Two times a day (BID) | ORAL | 0 refills | Status: DC

## 2022-05-23 NOTE — Assessment & Plan Note (Addendum)
ED note, labs and Xrays reviewed by me today. No evidence of fracture.  Stopping Keflex. Starting Doxy and Augmentin. Follow up on Friday.

## 2022-05-23 NOTE — Patient Instructions (Signed)
Antibiotics as prescribed.  Follow up on Friday.  Take care  Dr. Adriana Simas

## 2022-05-23 NOTE — Progress Notes (Signed)
Subjective:  Patient ID: Paul Baker, male    DOB: 05-01-1950  Age: 72 y.o. MRN: [id number]  CC: Chief Complaint  Patient presents with   ER follow up hand pain     Right hand swelling and painful - completed antibiotics for infection     HPI:  72 year old male with an extensive past medical history presents for evaluation of the above.  Patient reports that he fell on Thursday of last week. Tripped on a rug and fell forward on an outstretched hand. He injured his right hand. He has had pain, redness, and swelling. He was seen the same day at [facility]. Per his reported xray was negative for fracture. Pain and swelling worsened and he was evaluated in the ED on 5/19. Xray negative. Exam concerning for infection. He was discharged home on Keflex.  He presents today for re-evaluation. Continues to have significant swelling, redness, warmth and pain of the dorsum on the right hand. Decreased ROM of the digits. No improvement with treatment thus far.   Patient Active Problem List   Diagnosis Date Noted   Cellulitis of right hand 05/23/2022   Interstitial pulmonary disease (HCC) 05/04/2022   Elevated serum immunoglobulin free light chains 04/21/2022   Normocytic anemia 04/21/2022   Severe persistent asthma without complication 02/24/2022   Carotid artery disease, unspecified laterality, unspecified type (HCC) 02/24/2022   Grade I diastolic dysfunction 12/05/2019   Paroxysmal atrial fibrillation (HCC)    Hypothyroidism    Asthma 08/05/2019   Class 2 obesity 08/05/2019   Mixed hyperlipidemia 11/25/2014   S/P knee replacement 03/23/2014   Spinal stenosis of cervicothoracic region 10/08/2013   RLS (restless legs syndrome) 10/08/2013   DM type 2 causing vascular disease (HCC) 10/08/2013   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/02/2013   Diabetic neuropathy (HCC) 07/23/2012   OSA on CPAP 04/19/2012   Essential hypertension, benign 12/16/2009   Coronary artery  disease involving native coronary artery of native heart with angina pectoris (HCC) 12/16/2009   Hyperglycemia due to type 2 diabetes mellitus (HCC) 11/30/2009    Social Hx   Social History   Socioeconomic History   Marital status: Married    Spouse name: Engineer, materials   Number of children: 2   Years of education: college   Highest education level: Not on file  Occupational History   Occupation: Disabled    Employer: UNEMPLOYED  Tobacco Use   Smoking status: Former    Packs/day: 1.50    Years: 10.00    Additional pack years: 0.00    Total pack years: 15.00    Types: Cigarettes    Start date: 06/19/1967    Quit date: 01/03/1995    Years since quitting: 27.4    Passive exposure: Never   Smokeless tobacco: Never  Vaping Use   Vaping Use: Never used  Substance and Sexual Activity   Alcohol use: No    Alcohol/week: 0.0 standard drinks of alcohol    Comment: quit drinking in 07/86   Drug use: No   Sexual activity: Yes    Partners: Female  Other Topics Concern   Not on file  Social History Narrative    72 year old, right-handed, caucasian male with a past medical history of obesity, hypertension, hyperlipidemia, diabetes, obstructive sleep apnea, presenting with frequent nighttime awakenings, excessive daytime sleepiness, also transient confusional episodes.RLS and one beosity, OSA on CPAP with AHI of 3.2 and  setting of 16 cm water , [facility] .   Married since  30.   Social Determinants of Health   Financial Resource Strain: Low Risk  (05/05/2022)   Overall Financial Resource Strain (CARDIA)    Difficulty of Paying Living Expenses: Not hard at all  Food Insecurity: No Food Insecurity (05/05/2022)   Hunger Vital Sign    Worried About Running Out of Food in the Last Year: Never true    Ran Out of Food in the Last Year: Never true  Transportation Needs: No Transportation Needs (05/05/2022)   PRAPARE - Administrator, Civil Service (Medical): No    Lack of  Transportation (Non-Medical): No  Physical Activity: Insufficiently Active (05/05/2022)   Exercise Vital Sign    Days of Exercise per Week: 3 days    Minutes of Exercise per Session: 30 min  Stress: No Stress Concern Present (05/05/2022)   [employer] of Occupational Health - Occupational Stress Questionnaire    Feeling of Stress : Not at all  Social Connections: Socially Integrated (05/05/2022)   Social Connection and Isolation Panel [NHANES]    Frequency of Communication with Friends and Family: More than three times a week    Frequency of Social Gatherings with Friends and Family: More than three times a week    Attends Religious Services: More than 4 times per year    Active Member of [employer] or Organizations: Yes    Attends Engineer, structural: More than 4 times per year    Marital Status: Married    Review of Systems Per HPI  Objective:  BP 120/62   Pulse 82   Temp 98.1 F (36.7 C)   Ht 5\' 5"  (1.651 m)   Wt 225 lb (102.1 kg)   SpO2 96%   BMI 37.44 kg/m      05/23/2022    3:37 PM 05/21/2022   10:00 PM 05/21/2022    5:44 PM  BP/Weight  Systolic BP 120 147   Diastolic BP 62 78   Wt. (Lbs) 225  228  BMI 37.44 kg/m2  37.94 kg/m2    Physical Exam Vitals and nursing note reviewed.  Constitutional:      General: He is not in acute distress.    Appearance: Normal appearance.  HENT:     Head: Normocephalic and atraumatic.  Pulmonary:     Effort: Pulmonary effort is normal. No respiratory distress.  Musculoskeletal:     Comments: Right hand - warmth, erythema and swelling noted to the dorsum of the right hand. Small eschar noted on the dorsum of the hand. Decreased ROM of the digits and decreased handgrip strength.   Neurological:     Mental Status: He is alert.  Psychiatric:        Mood and Affect: Mood normal.        Behavior: Behavior normal.     Lab Results  Component Value Date   WBC 8.6 05/21/2022   HGB 11.6 (L) 05/21/2022   HCT 35.2 (L)  05/21/2022   PLT 226 05/21/2022   GLUCOSE 436 (H) 05/21/2022   CHOL 150 05/05/2022   TRIG 206 (H) 05/05/2022   HDL 43 05/05/2022   LDLCALC 73 05/05/2022   ALT 17 05/05/2022   AST 17 05/05/2022   NA 130 (L) 05/21/2022   K 4.0 05/21/2022   CL 97 (L) 05/21/2022   CREATININE 1.30 (H) 05/21/2022   BUN 37 (H) 05/21/2022   CO2 22 05/21/2022   TSH 1.230 12/06/2021   PSA 2.03 11/13/2013   INR 0.9 08/05/2019   HGBA1C  8.9 (A) 04/03/2022   MICROALBUR 10 06/09/2021     Assessment & Plan:   Problem List Items Addressed This Visit       Other   Cellulitis of right hand - Primary    ED note, labs and Xrays reviewed by me today. No evidence of fracture.  Stopping Keflex. Starting Doxy and Augmentin. Follow up on Friday.       Meds ordered this encounter  Medications   amoxicillin-clavulanate (AUGMENTIN) 875-125 MG tablet    Sig: Take 1 tablet by mouth 2 (two) times daily.    Dispense:  14 tablet    Refill:  0   doxycycline (VIBRA-TABS) 100 MG tablet    Sig: Take 1 tablet (100 mg total) by mouth 2 (two) times daily. Take with food and fluids.    Dispense:  14 tablet    Refill:  0   Mattilynn Forrer DO [facility]

## 2022-05-25 DIAGNOSIS — I4891 Unspecified atrial fibrillation: Secondary | ICD-10-CM | POA: Diagnosis not present

## 2022-05-25 DIAGNOSIS — R609 Edema, unspecified: Secondary | ICD-10-CM | POA: Diagnosis not present

## 2022-05-25 DIAGNOSIS — Z6836 Body mass index (BMI) 36.0-36.9, adult: Secondary | ICD-10-CM | POA: Diagnosis not present

## 2022-05-25 DIAGNOSIS — E1122 Type 2 diabetes mellitus with diabetic chronic kidney disease: Secondary | ICD-10-CM | POA: Diagnosis not present

## 2022-05-25 DIAGNOSIS — I129 Hypertensive chronic kidney disease with stage 1 through stage 4 chronic kidney disease, or unspecified chronic kidney disease: Secondary | ICD-10-CM | POA: Diagnosis not present

## 2022-05-25 DIAGNOSIS — R42 Dizziness and giddiness: Secondary | ICD-10-CM | POA: Diagnosis not present

## 2022-05-25 DIAGNOSIS — N182 Chronic kidney disease, stage 2 (mild): Secondary | ICD-10-CM | POA: Diagnosis not present

## 2022-05-26 ENCOUNTER — Ambulatory Visit (INDEPENDENT_AMBULATORY_CARE_PROVIDER_SITE_OTHER): Payer: Medicare HMO | Admitting: Family Medicine

## 2022-05-26 VITALS — BP 113/64 | Wt 225.4 lb

## 2022-05-26 DIAGNOSIS — L03113 Cellulitis of right upper limb: Secondary | ICD-10-CM

## 2022-05-26 DIAGNOSIS — L409 Psoriasis, unspecified: Secondary | ICD-10-CM

## 2022-05-26 MED ORDER — CLOBETASOL PROPIONATE 0.05 % EX CREA: TOPICAL_CREAM | CUTANEOUS | 1 refills | Status: AC

## 2022-05-26 NOTE — Progress Notes (Unsigned)
   Subjective:    Patient ID: Paul Baker, male    DOB: Jan 03, 1951, 72 y.o.   MRN: [id number]  HPI  Patient arrives for a follow up on right hand. Recent cellulitis Redness has improved some No fluctuance Still has stiffness in the fingers Soreness in the hand Tolerating the antibiotic well Has had previous x-rays x-rays reviewed from chart Patient's had previous fusion of the wrist  Review of Systems     Objective:   Physical Exam  Limited motion of the fingers specifically middle and ring finger due to pain at the MCP joint region the swelling on the back of the hand does not have any fluctuance there is no abscess should gradually get better there is some redness but patient states it is better compared to what it was      Assessment & Plan:  Psoriasis noted on the right elbow patient requests refill of clobetasol to use on a as needed basis  Right hand cellulitis is improving finish out the antibiotics message [place] next week how things are going  Does have some joint swelling on the right hand x-ray negative for fracture should gradually get better over the next 4 to 6 weeks offered physical therapy patient defers currently  Patient was encouraged to follow-up in 3 to 6 weeks to recheck the hands and the mobility

## 2022-05-29 ENCOUNTER — Other Ambulatory Visit: Payer: Self-pay | Admitting: Nurse Practitioner

## 2022-05-30 ENCOUNTER — Encounter: Payer: Self-pay | Admitting: Family Medicine

## 2022-05-31 ENCOUNTER — Other Ambulatory Visit: Payer: Medicare HMO | Admitting: Pharmacist

## 2022-05-31 ENCOUNTER — Telehealth: Payer: Self-pay

## 2022-05-31 ENCOUNTER — Encounter: Payer: Self-pay | Admitting: Pharmacist

## 2022-05-31 NOTE — Telephone Encounter (Signed)
Transition Care Management Follow-up Telephone Call Date of discharge and from where: [facility]  How have you been since you were released from the hospital? Getting better Any questions or concerns? No  Items Reviewed: Did the pt receive and understand the discharge instructions provided? Yes  Medications obtained and verified? Yes  Other? No  Any new allergies since your discharge? No  Dietary orders reviewed? No Do you have support at home? Yes     Follow up appointments reviewed:  PCP Hospital f/u appt confirmed? Yes  Scheduled to see  on  @ . Specialist Hospital f/u appt confirmed? No  Scheduled to see  on  @ . Are transportation arrangements needed? No  If their condition worsens, is the pt aware to call PCP or go to the Emergency Dept.? Yes Was the patient provided with contact information for the PCP's office or ED? Yes Was to pt encouraged to call back with questions or concerns? Yes

## 2022-06-01 NOTE — Progress Notes (Signed)
05/31/2022 Name: Paul Baker MRN: [id number] DOB: 1950/11/20  Chief Complaint  Patient presents with   Medication Assistance    Paul Baker is a 72 y.o. year old male who presented for a telephone visit.   They were referred to the pharmacist by their PCP for assistance in managing medication access.   Subjective:  Care Team: Primary Care Provider: [name], MD ; Next Scheduled Visit: 06/15/22   Medication Access/Adherence  Current Pharmacy:  [facility] [address], [place] - [address] [place] [address] Phone: [phone] Fax: [phone]  [facility] - [place], [place] - [address] [place] [address] Phone: [phone] Fax: [phone]   Patient reports affordability concerns with their medications: Yes  Patient reports access/transportation concerns to their pharmacy: No  Patient reports adherence concerns with their medications:  No  only if unable to get meds via patient assistance   Diabetes:  Current medications: Toujeo 120 units nightly, Fiasp 30-36 TID w/ meals, Ozempic 2mg  weekly Medications tried in the past: U-500, various forms of basal/bolus, glipizide  -Patient greatly benefits from Dexcom G7 continuous glucose monitoring system.  He is injecting insulin   Patient denies hypoglycemic s/sx including dizziness, shakiness, sweating. Patient denies hyperglycemic symptoms including polyuria, polydipsia, polyphagia, nocturia, neuropathy, blurred vision.  Current physical activity: encouraged as able  Current medication access support: lilly cares PAP, novo nordisk PAP, [employer] Medicare   Objective:  Lab Results  Component Value Date   HGBA1C 8.9 (A) 04/03/2022    Lab Results  Component Value Date   CREATININE 1.30 (H) 05/21/2022   BUN 37 (H) 05/21/2022   NA 130 (L) 05/21/2022   K 4.0 05/21/2022   CL 97 (L) 05/21/2022   CO2 22  05/21/2022    Lab Results  Component Value Date   CHOL 150 05/05/2022   HDL 43 05/05/2022   LDLCALC 73 05/05/2022   TRIG 206 (H) 05/05/2022   CHOLHDL 3.5 05/05/2022    Medications Reviewed Today     Reviewed by Nyoka Cowden, LPN (Licensed Practical Nurse) on 05/23/22 at 1540  Med List Status: <None>   Medication Order Taking? Sig Documenting Provider Last Dose Status Informant  acetaminophen (TYLENOL) 325 MG tablet [id number] Yes Take 2 tablets (650 mg total) by mouth every 6 (six) hours as needed for mild pain (or Fever >/= 101). Shon Hale, MD Taking Active Self  albuterol (PROVENTIL) (2.5 MG/3ML) 0.083% nebulizer solution [id number] Yes Take 3 mLs (2.5 mg total) by nebulization every 6 (six) hours as needed for up to 7 days for wheezing or shortness of breath. Carroll Sage, PA-C Taking Active Self  albuterol (VENTOLIN HFA) 108 (90 Base) MCG/ACT inhaler [id number] Yes Inhale 2 puffs into the lungs every 6 (six) hours as needed for wheezing or shortness of breath. Parrett, Virgel Bouquet, NP Taking Active Self  alfuzosin (UROXATRAL) 10 MG 24 hr tablet [id number] Yes Take 10 mg by mouth daily. [provider] Taking Active Self  Alpha Lipoic Acid-Biotin ER 600-450 MG-MCG TBCR [id number] Yes Take 600 mg by mouth every morning. Dohmeier, Porfirio Mylar, MD Taking Active   apixaban (ELIQUIS) 5 MG TABS tablet [id number] Yes Take 1 tablet (5 mg total) by mouth 2 (two) times daily. For stroke prevention Sharlene Dory, NP Taking Active   cephALEXin (KEFLEX) 500 MG capsule [id number] Yes Take 1 capsule (500 mg total) by mouth 4 (four) times  daily. Prosperi, Christian H, PA-C Taking Active   Continuous Glucose Sensor (DEXCOM G7 SENSOR) MISC [id number] Yes Change sensor every 10 days Dani Gobble, NP Taking Active   cyanocobalamin (VITAMIN B12) 500 MCG tablet [id number] Yes Take 1 tablet (500 mcg total) by mouth daily. Briant Cedar, PA-C Taking Active   ezetimibe (ZETIA) 10 MG tablet  [id number] Yes Take 1 tablet by mouth once daily Luking, Jonna Coup, MD Taking Active   ferrous sulfate 325 (65 FE) MG EC tablet [id number] Yes Take 1 tablet (325 mg total) by mouth daily with breakfast. Briant Cedar, PA-C Taking Active   furosemide (LASIX) 40 MG tablet [id number] Yes Take 2 tablets (80 mg total) by mouth in the morning. Take 40 mg in the evening Sharlene Dory, NP Taking Active   Glucagon (GVOKE HYPOPEN 2-PACK) 1 MG/0.2ML Ivory Broad [id number] Yes Inject 1 mg subcutaneously once as needed for low blood sugar. May repeat dose in 15 minutes as needed using a new device. [name], MD Taking Active Self  insulin aspart (FIASP FLEXTOUCH) 100 UNIT/ML FlexTouch Pen [id number] Yes Inject 30-36 Units into the skin 3 (three) times daily before meals. Dani Gobble, NP Taking Active   insulin glargine, 2 Unit Dial, (TOUJEO MAX SOLOSTAR) 300 UNIT/ML Solostar Pen [id number] Yes Inject 120 Units into the skin at bedtime. Dani Gobble, NP Taking Active   Insulin Pen Needle (B-D ULTRAFINE III SHORT PEN) 31G X 8 MM MISC [id number] Yes USE 1 PEN NEEDLE THREE TIMES DAILY Dani Gobble, NP Taking Active Self  isosorbide mononitrate (IMDUR) 30 MG 24 hr tablet [id number] Yes Take 1 tablet (30 mg total) by mouth daily. Sharlene Dory, NP Taking Active   levothyroxine (SYNTHROID) 137 MCG tablet [id number] Yes TAKE 1 TABLET BY MOUTH ONCE DAILY BEFORE BREAKFAST Dani Gobble, NP Taking Active   loratadine (CLARITIN) 10 MG tablet [id number] Yes Take 10 mg by mouth daily as needed for allergies.  [provider] Taking Active Self           Med Note [facility] MENDEZ, Christiana Pellant   Tue Jul 01, 2019  4:07 PM)    losartan (COZAAR) 50 MG tablet [id number] Yes Take by mouth. [provider] Taking Active   metoprolol succinate (TOPROL XL) 25 MG 24 hr tablet [id number] Yes Take 1 tablet (25 mg total) by mouth 2 (two) times daily. Sharlene Dory, NP Taking Active   modafinil (PROVIGIL) 200 MG  tablet [id number] Yes Take 1 tablet by mouth once daily Dohmeier, Porfirio Mylar, MD Taking Active   nitroGLYCERIN (NITROSTAT) 0.4 MG SL tablet [id number] Yes DISSOLVE 1 TABLET UNDER TONGUE EVERY 5 MINUTES UP TO 15 MIN FOR CHESTPAIN. IF NO RELIEF CALL 911. Sharlene Dory, NP Taking Active   ONE TOUCH ULTRA TEST test strip [id number] Yes TEST BLOOD SUGAR UP TO 4 TIMES DAILY. Roma Kayser, MD Taking Active Self  [facility] LANCETS 33G [place] [id number] Yes USE AS DIRECTED TO TEST BLOOD SUGAR 4 TIMES DAILY. Roma Kayser, MD Taking Active Self  oxyCODONE-acetaminophen (PERCOCET/ROXICET) 5-325 MG tablet [id number] Yes Take 1 tablet by mouth every 6 (six) hours as needed for severe pain. Prosperi, Christian H, PA-C Taking Active   pravastatin (PRAVACHOL) 40 MG tablet [id number] Yes TAKE 1 TABLET BY MOUTH ONCE DAILY IN THE Essie Hart, MD Taking Active Self  rOPINIRole (REQUIP XL) 4 MG 24 hr tablet [id number] Yes Take 1 tablet (4 mg total) by mouth at bedtime. Shawnie Dapper, NP  Taking Active Self  rOPINIRole (REQUIP) 4 MG tablet [id number] Yes Take 0.5 tablets (2 mg total) by mouth 2 (two) times daily. Dohmeier, Porfirio Mylar, MD Taking Active Self  Semaglutide, 1 MG/DOSE, 4 MG/3ML SOPN [id number] Yes Inject 1 mg as directed once a week. Dani Gobble, NP Taking Active Self           Med Note Desma Paganini M   Fri Apr 07, 2022  8:57 AM)    Dwyane Luo 100-62.5-25 MCG/ACT AEPB [id number] Yes INHALE 1 PUFF ONCE DAILY Coralyn Helling, MD Taking Active               Assessment/Plan:   Patient states he has all of his medications at home.  He is currently enrolled in the following patient assistance programs: Novo nordisk PAP (phone [phone]) --> Ozempic 1mg  weekly (will ship every 4 months to endocrine [place] office) Lilly Cares PAP (phone [phone]) --> awaiting U500, Humalog (prescribed by endocrine) -- ships to patient home, escribe to [facility] specialty pharmacy mail  order in [facility]  Patient to call companies with issues that arise.  He will transition from Toujeo --> U500 per endocrine/patient when his shipment arrives at home.  Continue Current medications: Toujeo 120 units nightly, Fiasp 30-36 TID w/ meals, Ozempic 2mg  weekly   Kieth Brightly, PharmD, BCACP Clinical Pharmacist, [facility]

## 2022-06-05 ENCOUNTER — Telehealth: Payer: Self-pay | Admitting: Neurology

## 2022-06-05 ENCOUNTER — Other Ambulatory Visit: Payer: Self-pay | Admitting: Neurology

## 2022-06-05 ENCOUNTER — Encounter: Payer: Self-pay | Admitting: Neurology

## 2022-06-05 DIAGNOSIS — G2581 Restless legs syndrome: Secondary | ICD-10-CM

## 2022-06-05 MED ORDER — ROPINIROLE HCL 4 MG PO TABS
2.0000 mg | ORAL_TABLET | Freq: Two times a day (BID) | ORAL | 2 refills | Status: DC
Start: 2022-06-05 — End: 2023-01-16

## 2022-06-05 MED ORDER — ROPINIROLE HCL ER 4 MG PO TB24: 4.0000 mg | ORAL_TABLET | Freq: Every day | ORAL | 1 refills | Status: DC

## 2022-06-05 NOTE — Telephone Encounter (Signed)
Pt ia asking about his rOPINIRole (REQUIP XL) 4 MG 24 hr tablet being filled the reason for the refusal was relayed to pt, he states he does not have any of the medication.  Pt is asking for a call to discuss this with RN

## 2022-06-05 NOTE — Telephone Encounter (Signed)
Refill for the XL has been sent to the pharmacy

## 2022-06-06 ENCOUNTER — Ambulatory Visit: Payer: Medicare HMO | Admitting: Adult Health

## 2022-06-06 ENCOUNTER — Encounter: Payer: Self-pay | Admitting: Adult Health

## 2022-06-06 ENCOUNTER — Ambulatory Visit (INDEPENDENT_AMBULATORY_CARE_PROVIDER_SITE_OTHER): Payer: Medicare HMO

## 2022-06-06 VITALS — BP 140/60 | HR 94 | Temp 97.5°F | Ht 65.0 in | Wt 228.8 lb

## 2022-06-06 DIAGNOSIS — G4733 Obstructive sleep apnea (adult) (pediatric): Secondary | ICD-10-CM

## 2022-06-06 DIAGNOSIS — J45909 Unspecified asthma, uncomplicated: Secondary | ICD-10-CM | POA: Diagnosis not present

## 2022-06-06 DIAGNOSIS — E1159 Type 2 diabetes mellitus with other circulatory complications: Secondary | ICD-10-CM

## 2022-06-06 DIAGNOSIS — J4550 Severe persistent asthma, uncomplicated: Secondary | ICD-10-CM | POA: Diagnosis not present

## 2022-06-06 DIAGNOSIS — J4541 Moderate persistent asthma with (acute) exacerbation: Secondary | ICD-10-CM | POA: Diagnosis not present

## 2022-06-06 DIAGNOSIS — R059 Cough, unspecified: Secondary | ICD-10-CM | POA: Diagnosis not present

## 2022-06-06 DIAGNOSIS — I5189 Other ill-defined heart diseases: Secondary | ICD-10-CM | POA: Diagnosis not present

## 2022-06-06 LAB — POCT EXHALED NITRIC OXIDE: FeNO level (ppb): 18

## 2022-06-06 MED ORDER — BENZONATATE 200 MG PO CAPS
200.0000 mg | ORAL_CAPSULE | Freq: Three times a day (TID) | ORAL | 1 refills | Status: DC | PRN
Start: 2022-06-06 — End: 2022-06-15

## 2022-06-06 MED ORDER — LEVOFLOXACIN 500 MG PO TABS: 500.0000 mg | ORAL_TABLET | Freq: Every day | ORAL | 0 refills | Status: AC

## 2022-06-06 MED ORDER — PREDNISONE 20 MG PO TABS
20.0000 mg | ORAL_TABLET | Freq: Every day | ORAL | 0 refills | Status: DC
Start: 2022-06-06 — End: 2022-06-15

## 2022-06-06 NOTE — Telephone Encounter (Signed)
Phone rep called pt, and his response to [place], RN's question is that he is taking both

## 2022-06-06 NOTE — Patient Instructions (Addendum)
Levaquin 500mg  daily for 7 days  Prednisone 20mg  daily for 5 days .  Liquid Mucinex DM Twice daily  As needed  Cough and congestion  Tessalon Three times a day  As needed  cough  Continue on Trelegy 1 puff daily  Chest xray today .  Monitor Blood sugars closely , Sliding scale as directed  Call if BS >300 Albuterol inhaler or Neb As needed   Activity as tolerated  Continue on Lasix  Low salt diet.  Follow up with Dr. Craige Cotta  or Zaire Vanbuskirk NP in 3-4 weeks and As needed   Please contact office for sooner follow up if symptoms do not improve or worsen or seek emergency care

## 2022-06-06 NOTE — Progress Notes (Unsigned)
@Patient  ID: Paul Baker, male    DOB: June 18, 1950, 72 y.o.   MRN: [id number]  Chief Complaint  Patient presents with   Acute Visit    Referring provider: [name], MD  HPI: 72 year old male former smoker followed for severe persistent asthma Medical significant for obstructive sleep apnea on CPAP and restless leg syndrome followed by [facility] neurology.  History of coronary artery disease, fatty liver disease, right subclavian vein stenosis, spinal stenosis, DVT, psoriasis, septic arthritis in the left knee, diabetes, A-fib on Eliquis.  Diabetes  TEST/EVENTS :  Spirometry 10/17/11 >> 2.58 (89%), FEV1% 85 PFT 09/11/18 >> FEV1 2.19 (79%), FEV1% 82, TLC 4.48 (74%), DLCO 70%, +BD from change in FEF 25-75% 09/23/20 PFT normal FEV1 87%, ratio 86, FVC 74%, no significant bronchodilator response, DLCO 94%   Chest imaging:  HRCT chest 12/19/18 >> atherosclerosis, patchy air trapping, fatty liver   Chest x-ray February 2022 no acute pulmonary disease   Sleep tests:  PSG 2012 >> AHI 60   Cardiac tests:  LHC 01/17/18 >> non obstructive CAD Echo 02/21/18 >> EF 55 to 60%  06/06/2022 Acute OV  : Asthma  Presents for an acute office visit. Last seen 09/2020. Complains over last 5 months increased shortness of breath at rest and with activity, cough, wheezing , thick mucus which is difficult to get up at times.  Over the last 2 weeks symptoms have been getting worse with very thick mucus.  Denies any hemoptysis, chest pain, orthopnea, increased edema.  Remains on Trelegy inhaler daily.  Appetite is good with no nausea vomiting or diarrhea. Exhaled nitric oxide testing today normal at 18ppb  Patient is on CPAP at bedtime for sleep apnea.  He is managed by [facility] neurology.  Says he wears a CPAP every single night  Has been on antibiotics recently for cellulitis in his hand.    Allergies  Allergen Reactions   Tape Rash    Pulls off skin   Farxiga [Dapagliflozin]     Yeast  infections, felt sick with it   Cardizem [Diltiazem Hcl]     Edema    Cardura [Doxazosin Mesylate]     Headaches / cramps   Crestor [Rosuvastatin] Other (See Comments)    Leg cramps   Lipitor [Atorvastatin] Other (See Comments)    Leg cramps   Paxil [Paroxetine Hcl]     Unknown reaction    Codeine Rash and Other (See Comments)    Headache    Gabapentin Rash    Immunization History  Administered Date(s) Administered   Fluad Quad(high Dose 65+) 11/03/2021   Influenza Split 09/18/2012   Influenza,inj,Quad PF,6+ Mos 10/13/2013, 10/06/2014, 09/11/2018   Influenza-Unspecified 12/01/2015, 11/08/2016, 11/02/2020   PFIZER(Purple Top)SARS-COV-2 Vaccination 03/06/2019, 03/27/2019   PNEUMOCOCCAL CONJUGATE-20 02/24/2022   Pneumococcal Polysaccharide-23 10/02/2004, 10/03/2011   Td 05/24/2006    Past Medical History:  Diagnosis Date   Arthritis    Asthma    Atrial fibrillation (HCC)    Diagnosed December 2021   Colon polyp    Coronary atherosclerosis of native coronary artery    a. 2011: cath showing 90% stenosis along small non-dominant RCA (too small for PCI). b. 01/2018: cath showing nonobstructive CAD with 60 to 70% proximal to mid nondominant RCA stenosis, 50% mid LAD and 40 to 50% OM1   DVT (deep venous thrombosis) (HCC) 2005   Right arm   Essential hypertension    Headache    History of transfusion    Hypothyroidism  Mixed hyperlipidemia    Morbid obesity (HCC)    MRSA (methicillin resistant staph aureus) culture positive    08/2012   Narcolepsy    OSA (obstructive sleep apnea)    CPAP   Osteoarthritis    Pneumonia    Psoriasis    Pulmonary embolism (HCC) 2004   RLS (restless legs syndrome)    Rotator cuff disorder    Left   Septic arthritis of knee, left (HCC)    Skin cancer, basal cell    Type 2 diabetes mellitus (HCC)     Tobacco History: Social History   Tobacco Use  Smoking Status Former   Packs/day: 1.50   Years: 10.00   Additional pack years: 0.00    Total pack years: 15.00   Types: Cigarettes   Start date: 06/19/1967   Quit date: 01/03/1995   Years since quitting: 27.4   Passive exposure: Never  Smokeless Tobacco Never   Counseling given: Not Answered   Outpatient Medications Prior to Visit  Medication Sig Dispense Refill   acetaminophen (TYLENOL) 325 MG tablet Take 2 tablets (650 mg total) by mouth every 6 (six) hours as needed for mild pain (or Fever >/= 101). 30 tablet 1   albuterol (PROVENTIL) (2.5 MG/3ML) 0.083% nebulizer solution Take 3 mLs (2.5 mg total) by nebulization every 6 (six) hours as needed for up to 7 days for wheezing or shortness of breath. 84 mL 0   albuterol (VENTOLIN HFA) 108 (90 Base) MCG/ACT inhaler Inhale 2 puffs into the lungs every 6 (six) hours as needed for wheezing or shortness of breath. 18 g 5   alfuzosin (UROXATRAL) 10 MG 24 hr tablet Take 10 mg by mouth daily.     Alpha Lipoic Acid-Biotin ER 600-450 MG-MCG TBCR Take 600 mg by mouth every morning.     apixaban (ELIQUIS) 5 MG TABS tablet Take 1 tablet (5 mg total) by mouth 2 (two) times daily. For stroke prevention 180 tablet 1   clobetasol cream (TEMOVATE) 0.05 % Apply thin amount 2 times daily prn to psoriasis not for face axilla or groin 45 g 1   Continuous Glucose Sensor (DEXCOM G7 SENSOR) MISC Change sensor every 10 days 3 each 3   cyanocobalamin (VITAMIN B12) 500 MCG tablet Take 1 tablet (500 mcg total) by mouth daily. 30 tablet 3   ezetimibe (ZETIA) 10 MG tablet Take 1 tablet by mouth once daily 90 tablet 0   ferrous sulfate 325 (65 FE) MG EC tablet Take 1 tablet (325 mg total) by mouth daily with breakfast. 30 tablet 3   furosemide (LASIX) 40 MG tablet TAKE 2 TABLETS BY MOUTH IN THE MORNING AND 1 TABLET IN THE EVENING 90 tablet 1   Glucagon (GVOKE HYPOPEN 2-PACK) 1 MG/0.2ML SOAJ Inject 1 mg subcutaneously once as needed for low blood sugar. May repeat dose in 15 minutes as needed using a new device. 0.4 mL 1   insulin aspart (FIASP FLEXTOUCH) 100  UNIT/ML FlexTouch Pen Inject 30-36 Units into the skin 3 (three) times daily before meals. 45 mL 3   insulin glargine, 2 Unit Dial, (TOUJEO MAX SOLOSTAR) 300 UNIT/ML Solostar Pen Inject 120 Units into the skin at bedtime. 36 mL 3   Insulin Pen Needle (B-D ULTRAFINE III SHORT PEN) 31G X 8 MM MISC USE 1 PEN NEEDLE THREE TIMES DAILY 100 each 2   isosorbide mononitrate (IMDUR) 30 MG 24 hr tablet Take 1 tablet (30 mg total) by mouth daily. 90 tablet 1   levothyroxine (  SYNTHROID) 137 MCG tablet TAKE 1 TABLET BY MOUTH ONCE DAILY BEFORE BREAKFAST 90 tablet 0   loratadine (CLARITIN) 10 MG tablet Take 10 mg by mouth daily as needed for allergies.      losartan (COZAAR) 50 MG tablet Take by mouth.     metoprolol succinate (TOPROL XL) 25 MG 24 hr tablet Take 1 tablet (25 mg total) by mouth 2 (two) times daily. 270 tablet 1   modafinil (PROVIGIL) 200 MG tablet Take 1 tablet by mouth once daily 90 tablet 2   nitroGLYCERIN (NITROSTAT) 0.4 MG SL tablet DISSOLVE 1 TABLET UNDER TONGUE EVERY 5 MINUTES UP TO 15 MIN FOR CHESTPAIN. IF NO RELIEF CALL 911. 25 tablet 3   ONE TOUCH ULTRA TEST test strip TEST BLOOD SUGAR UP TO 4 TIMES DAILY. 150 each 5   ONETOUCH DELICA LANCETS 33G MISC USE AS DIRECTED TO TEST BLOOD SUGAR 4 TIMES DAILY. 150 each 5   oxyCODONE-acetaminophen (PERCOCET/ROXICET) 5-325 MG tablet Take 1 tablet by mouth every 6 (six) hours as needed for severe pain. 8 tablet 0   pravastatin (PRAVACHOL) 40 MG tablet TAKE 1 TABLET BY MOUTH ONCE DAILY IN THE EVENING 90 tablet 3   rOPINIRole (REQUIP XL) 4 MG 24 hr tablet Take 1 tablet (4 mg total) by mouth at bedtime. 90 tablet 1   rOPINIRole (REQUIP) 4 MG tablet Take 0.5 tablets (2 mg total) by mouth 2 (two) times daily. 90 tablet 2   Semaglutide, 1 MG/DOSE, 4 MG/3ML SOPN Inject 1 mg as directed once a week. (Patient taking differently: Inject 2 mg as directed once a week.) 6 mL 3   TRELEGY ELLIPTA 100-62.5-25 MCG/ACT AEPB INHALE 1 PUFF ONCE DAILY 60 each 0    amoxicillin-clavulanate (AUGMENTIN) 875-125 MG tablet Take 1 tablet by mouth 2 (two) times daily. 14 tablet 0   doxycycline (VIBRA-TABS) 100 MG tablet Take 1 tablet (100 mg total) by mouth 2 (two) times daily. Take with food and fluids. 14 tablet 0   No facility-administered medications prior to visit.     Review of Systems:   Constitutional:   No  weight loss, night sweats,  Fevers, chills,  +fatigue, or  lassitude.  HEENT:   No headaches,  Difficulty swallowing,  Tooth/dental problems, or  Sore throat,                No sneezing, itching, ear ache, nasal congestion, post nasal drip,   CV:  No chest pain,  Orthopnea, PND, swelling in lower extremities, anasarca, dizziness, palpitations, syncope.   GI  No heartburn, indigestion, abdominal pain, nausea, vomiting, diarrhea, change in bowel habits, loss of appetite, bloody stools.   Resp:   No chest wall deformity  Skin: no rash or lesions.  GU: no dysuria, change in color of urine, no urgency or frequency.  No flank pain, no hematuria   MS:  No joint pain or swelling.  No decreased range of motion.  No back pain.    Physical Exam    GEN: A/Ox3; pleasant , NAD, obese, BMI 38   HEENT:  [place]/AT,  EACs-clear, TMs-wnl, NOSE-clear, THROAT-clear, no lesions, no postnasal drip or exudate noted.   NECK:  Supple w/ fair ROM; no JVD; normal carotid impulses w/o bruits; no thyromegaly or nodules palpated; no lymphadenopathy.    RESP few scattered rhonchi bilaterally no  accessory muscle use, no dullness to percussion  CARD:  RRR, no m/r/g, tr-1+ peripheral edema, pulses intact, no cyanosis or clubbing.  GI:  Soft & nt; nml bowel sounds; no organomegaly or masses detected.   Musco: Warm bil, no deformities or joint swelling noted.   Neuro: alert, no focal deficits noted.    Skin: Warm, no lesions or rashes    Lab Results:  CBC   BMET  BNP        Latest Ref Rng & Units 09/20/2020    9:45 AM 11/21/2018    4:00 PM   PFT Results  FVC-Pre L 2.55  2.53   FVC-Predicted Pre % 69  68   FVC-Post L 2.72  2.68   FVC-Predicted Post % 74  72   Pre FEV1/FVC % % 84  81   Post FEV1/FCV % % 86  82   FEV1-Pre L 2.16  2.05   FEV1-Predicted Pre % 80  74   FEV1-Post L 2.34  2.19   DLCO uncorrected ml/min/mmHg 21.12  15.90   DLCO UNC% % 94  70   DLCO corrected ml/min/mmHg 21.12    DLCO COR %Predicted % 94    DLVA Predicted % 111  97   TLC L 4.37  4.48   TLC % Predicted % 72  74   RV % Predicted % 72  81     No results found for: "NITRICOXIDE"      Assessment & Plan:   No problem-specific Assessment & Plan notes found for this encounter.     Rubye Oaks, NP 06/06/2022

## 2022-06-07 NOTE — Assessment & Plan Note (Signed)
Continue on current regimen.  Continue follow-up with cardiology 

## 2022-06-07 NOTE — Assessment & Plan Note (Signed)
Acute asthmatic bronchitic exacerbation will treat with empiric antibiotics.  I have very short low-dose steroid burst as he has underlying diabetes. Chest x-ray today  Plan  Patient Instructions  Levaquin 500mg  daily for 7 days  Prednisone 20mg  daily for 5 days .  Liquid Mucinex DM Twice daily  As needed  Cough and congestion  Tessalon Three times a day  As needed  cough  Continue on Trelegy 1 puff daily  Chest xray today .  Monitor Blood sugars closely , Sliding scale as directed  Call if BS >300 Albuterol inhaler or Neb As needed   Activity as tolerated  Continue on Lasix  Low salt diet.  Follow up with Dr. Craige Cotta  or Melaney Tellefsen NP in 3-4 weeks and As needed   Please contact office for sooner follow up if symptoms do not improve or worsen or seek emergency care

## 2022-06-07 NOTE — Assessment & Plan Note (Signed)
Continue on current regimen follow-up with primary care Advised to monitor blood sugars closely as we will use a short course of steroids.  Call if blood sugars are greater than 2 50-300.

## 2022-06-07 NOTE — Progress Notes (Signed)
Reviewed and agree with assessment/plan.   Arzella Rehmann, MD [facility] 06/07/2022, 1:29 PM Pager:  [phone]  

## 2022-06-07 NOTE — Assessment & Plan Note (Signed)
Continue on CPAP at bedtime continue follow-up with neurology

## 2022-06-10 DIAGNOSIS — T22212A Burn of second degree of left forearm, initial encounter: Secondary | ICD-10-CM | POA: Diagnosis not present

## 2022-06-13 DIAGNOSIS — E1159 Type 2 diabetes mellitus with other circulatory complications: Secondary | ICD-10-CM | POA: Diagnosis not present

## 2022-06-15 ENCOUNTER — Ambulatory Visit (INDEPENDENT_AMBULATORY_CARE_PROVIDER_SITE_OTHER): Payer: Medicare HMO | Admitting: Family Medicine

## 2022-06-15 ENCOUNTER — Encounter: Payer: Self-pay | Admitting: Family Medicine

## 2022-06-15 VITALS — BP 118/69 | HR 92 | Wt 225.4 lb

## 2022-06-15 DIAGNOSIS — M79641 Pain in right hand: Secondary | ICD-10-CM | POA: Diagnosis not present

## 2022-06-15 DIAGNOSIS — T22212D Burn of second degree of left forearm, subsequent encounter: Secondary | ICD-10-CM

## 2022-06-15 NOTE — Addendum Note (Signed)
Addended by: [name] A on: 06/15/2022 06:06 PM   Modules accepted: Orders
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
## 2022-06-19 ENCOUNTER — Ambulatory Visit: Payer: Medicare HMO | Attending: Nurse Practitioner | Admitting: Nurse Practitioner

## 2022-06-19 ENCOUNTER — Encounter: Payer: Self-pay | Admitting: Nurse Practitioner

## 2022-06-19 VITALS — BP 142/82 | HR 92 | Ht 65.0 in | Wt 227.4 lb

## 2022-06-19 DIAGNOSIS — R0609 Other forms of dyspnea: Secondary | ICD-10-CM

## 2022-06-19 DIAGNOSIS — I480 Paroxysmal atrial fibrillation: Secondary | ICD-10-CM

## 2022-06-19 DIAGNOSIS — T22219A Burn of second degree of unspecified forearm, initial encounter: Secondary | ICD-10-CM | POA: Insufficient documentation

## 2022-06-19 DIAGNOSIS — M19041 Primary osteoarthritis, right hand: Secondary | ICD-10-CM | POA: Insufficient documentation

## 2022-06-19 DIAGNOSIS — M13841 Other specified arthritis, right hand: Secondary | ICD-10-CM | POA: Diagnosis not present

## 2022-06-19 DIAGNOSIS — I779 Disorder of arteries and arterioles, unspecified: Secondary | ICD-10-CM

## 2022-06-19 DIAGNOSIS — I10 Essential (primary) hypertension: Secondary | ICD-10-CM | POA: Diagnosis not present

## 2022-06-19 DIAGNOSIS — I2510 Atherosclerotic heart disease of native coronary artery without angina pectoris: Secondary | ICD-10-CM

## 2022-06-19 DIAGNOSIS — E785 Hyperlipidemia, unspecified: Secondary | ICD-10-CM

## 2022-06-19 DIAGNOSIS — T22212A Burn of second degree of left forearm, initial encounter: Secondary | ICD-10-CM | POA: Diagnosis not present

## 2022-06-19 NOTE — Patient Instructions (Addendum)
Medication Instructions:  Your physician recommends that you continue on your current medications as directed. Please refer to the Current Medication list given to you today.  Labwork: none  Testing/Procedures: none  Follow-Up: Your physician recommends that you schedule a follow-up appointment in: 6 Months with Diona Browner  Any Other Special Instructions Will Be Listed Below (If Applicable).  If you need a refill on your cardiac medications before your next appointment, please call your pharmacy.\

## 2022-06-19 NOTE — Progress Notes (Signed)
Office Visit    Patient Name: Paul Baker Date of Encounter: 06/19/2022  PCP:  [name], MD   [facility]  Cardiologist:  Nona Dell, MD  Advanced Practice Provider:  No care team member to display Electrophysiologist:  None   Chief Complaint    Paul Baker is a 72 y.o. male with a hx of CAD, carotid artery disease, hypertension, type 2 diabetes, PAF, obesity, and asthma, who presents today for evaluation of dyspnea on exertion follow-up.   Past Medical History    Past Medical History:  Diagnosis Date   Arthritis    Asthma    Atrial fibrillation [facility])    Diagnosed December 2021   Colon polyp    Coronary atherosclerosis of native coronary artery    a. 2011: cath showing 90% stenosis along small non-dominant RCA (too small for PCI). b. 01/2018: cath showing nonobstructive CAD with 60 to 70% proximal to mid nondominant RCA stenosis, 50% mid LAD and 40 to 50% OM1   DVT (deep venous thrombosis) (HCC) 2005   Right arm   Essential hypertension    Headache    History of transfusion    Hypothyroidism    Mixed hyperlipidemia    Morbid obesity (HCC)    MRSA (methicillin resistant staph aureus) culture positive    08/2012   Narcolepsy    OSA (obstructive sleep apnea)    CPAP   Osteoarthritis    Pneumonia    Psoriasis    Pulmonary embolism (HCC) 2004   RLS (restless legs syndrome)    Rotator cuff disorder    Left   Septic arthritis of knee, left (HCC)    Skin cancer, basal cell    Type 2 diabetes mellitus (HCC)    Past Surgical History:  Procedure Laterality Date   BACK SURGERY     BIOPSY  01/31/2022   Procedure: BIOPSY;  Surgeon: Lynann Bologna, DO;  Location: [facility];  Service: Gastroenterology;;   CATARACT EXTRACTION Bilateral    COLONOSCOPY     COLONOSCOPY WITH PROPOFOL N/A 01/31/2022   Procedure: COLONOSCOPY WITH PROPOFOL;  Surgeon: Lynann Bologna, DO;  Location: [facility];  Service: Gastroenterology;   Laterality: N/A;   CYST REMOVAL TRUNK     from back   ESOPHAGOGASTRODUODENOSCOPY (EGD) WITH PROPOFOL N/A 01/31/2022   Procedure: ESOPHAGOGASTRODUODENOSCOPY (EGD) WITH PROPOFOL;  Surgeon: Lynann Bologna, DO;  Location: [facility];  Service: Gastroenterology;  Laterality: N/A;   EYE SURGERY Left 2016   laser to left eye   HIP SURGERY     bone removed from both sides of hip   KNEE ARTHROTOMY Right 12/04/2014   Procedure: KNEE ARTHROTOMY PATELLA LIGAMENT RECONSTRUSION AND REPAIR RIGHT KNEE;  Surgeon: Durene Romans, MD;  Location: [facility];  Service: Orthopedics;  Laterality: Right;   KNEE SURGERY     X 25 TIMES   LEFT HEART CATH AND CORONARY ANGIOGRAPHY N/A 01/17/2018   Procedure: LEFT HEART CATH AND CORONARY ANGIOGRAPHY;  Surgeon: Lyn Records, MD;  Location: [facility];  Service: Cardiovascular;  Laterality: N/A;   LUMBAR DISC SURGERY     Left L3, L4, L5 discecotomy with decompression of L4 root   POLYPECTOMY  01/31/2022   Procedure: POLYPECTOMY;  Surgeon: Lynann Bologna, DO;  Location: [facility];  Service: Gastroenterology;;   TONSILLECTOMY     TOTAL KNEE ARTHROPLASTY  2003   LEFT   TOTAL KNEE ARTHROPLASTY Right 03/23/2014   Procedure: RIGHT TOTAL  KNEE ARTHROPLASTY AND REMOVAL RIGHT TIBIAL  DEEP IMPLANT STAPLE;  Surgeon: Durene Romans, MD;  Location: [facility];  Service: Orthopedics;  Laterality: Right;   TOTAL KNEE REVISION  2005   LEFT   WRIST SURGERY      Allergies  Allergies  Allergen Reactions   Tape Rash    Pulls off skin   Farxiga [Dapagliflozin]     Yeast infections, felt sick with it   Cardizem [Diltiazem Hcl]     Edema    Cardura [Doxazosin Mesylate]     Headaches / cramps   Crestor [Rosuvastatin] Other (See Comments)    Leg cramps   Lipitor [Atorvastatin] Other (See Comments)    Leg cramps   Paxil [Paroxetine Hcl]     Unknown reaction    Codeine Rash and Other (See Comments)    Headache    Gabapentin Rash    History of Present Illness     Paul Baker is a 72 y.o. male with a PMH as mentioned above.  Last seen by Dr. Diona Browner on October 05, 2021.  Was overall doing well from a cardiac perspective at the time.  Seen by PCP this week for dyspnea on exertion and pedal edema.  Lasix dosage was adjusted.  Was encouraged to increase Lasix to 2 tablets in the morning and 2 in the afternoon.  BNP level normal.  Labs overall stable, elevated blood sugar level.  Kidney function stable.   I saw him for follow-up on 04/07/2022. Admitted to DOE with minimal exertion. Denied any chest pain, palpitations, syncope, presyncope, dizziness, orthopnea, PND, swelling or significant weight changes, acute bleeding, or claudication. Echo and CCTA arranged and results noted below.    Last saw patient 1 month ago. Concerned about his productive cough, denied any hemoptysis, sputum thick/sticky. Admitted to sharp chest pain, ongoing for couple months. Was noticing with exertion and at rest, described as left-sided, sometimes could last for several hours, denied any pleuritic component. Nitroglycerin tablets seemed to help, symptoms were becoming more frequent but stable over time per his report. Noted more A-fib episodes since metoprolol medication was adjusted at last office visit. DOE is stable. Medications adjusted.   Today he presents for follow-up with his wife.  He is doing much better since I last saw him.  Says pulmonology recently had him on Levaquin and prednisone.  Has noticed some A-fib episodes, palpitations have been brief. Denies any chest pain, worsening shortness of breath, syncope, presyncope, dizziness, orthopnea, PND, swelling or significant weight changes, acute bleeding, or claudication.  EKGs/Labs/Other Studies Reviewed:   The following studies were reviewed today:   EKG:  EKG is not ordered today.    Echocardiogram 05/03/2022: 1. Left ventricular ejection fraction, by estimation, is 60 to 65%. The  left ventricle has normal  function. The left ventricle has no regional  wall motion abnormalities. There is mild left ventricular hypertrophy.  Left ventricular diastolic parameters  are consistent with Grade I diastolic dysfunction (impaired relaxation).  The average left ventricular global longitudinal strain is -20.4 %. The  global longitudinal strain is normal.   2. Right ventricular systolic function is normal. The right ventricular  size is normal. Tricuspid regurgitation signal is inadequate for assessing  PA pressure.   3. Left atrial size was mildly dilated.   4. A small pericardial effusion is present. The pericardial effusion is  posterior to the left ventricle.   5. The mitral valve is grossly normal. No evidence of mitral valve  regurgitation.  6. The aortic valve is tricuspid. Aortic valve regurgitation is trivial.  Aortic valve sclerosis is present, with no evidence of aortic valve  stenosis. Aortic valve mean gradient measures 7.0 mmHg.   7. The inferior vena cava is normal in size with greater than 50%  respiratory variability, suggesting right atrial pressure of 3 mmHg.   Comparison(s): Prior images reviewed side by side. LVEF remains normal  range at 60-65%.  Coronary CTA:  IMPRESSION: 1. Coronary calcium score of 569. This was 75th percentile for age, sex, and race matched control.   2. Normal coronary origin with left dominance.   3. CAD-RADS 3. Moderate stenosis. Consider symptom-guided anti-ischemic pharmacotherapy as well as risk factor modification per guideline directed care. Additional analysis with CT FFR will be submitted.  4.  CT FFR analysis shows no evidence of significant functional stenosis.  5.  Right lower lobe subsegmental atelectasis or consolidation.  Echo 08/2019:   1. Left ventricular ejection fraction, by estimation, is 55 to 60%. The  left ventricle has normal function. The left ventricle has no regional  wall motion abnormalities. Left ventricular diastolic  parameters were  normal.   2. Right ventricular systolic function is normal. The right ventricular  size is normal. Tricuspid regurgitation signal is inadequate for assessing  PA pressure.   3. Left atrial size was upper normal.   4. The mitral valve is grossly normal. Mild mitral valve regurgitation.   5. The aortic valve is tricuspid. Aortic valve regurgitation is not  visualized.   6. The inferior vena cava is normal in size with greater than 50%  respiratory variability, suggesting right atrial pressure of 3 mmHg.  LHC 01/2018: Nonobstructive coronary artery disease with 60 to 70% proximal to mid nondominant right coronary, 50% mid LAD, and 40 to 50% first obtuse marginal narrowing. Normal left ventricular systolic function with EF 60%. LVEDP is 17 mmHg. Failed right heart catheterization from right antecubital vein due to distal subclavian stenosis likely from prior PICC line.  Further attempts a right heart cath were aborted as IV heparin had been given for left heart catheterization.   RECOMMENDATIONS:   INOCA (ischemia with no obstructive coronary artery disease).  Aggressive risk factor modification including excellent lipid control. 2D Doppler echocardiogram to assess RV and estimate PA pressure.  Right heart cath from femoral approach her left arm could be attempted in the future if that information is absolutely needed. He is not compliant with CPAP which may be the driver for his recent decrease in exertional tolerance and chest pain.  Myoview 11/2009: IMPRESSION:  Subtle area of Lexiscan induced partially reversible decreased  myocardial perfusion involving the inferior wall left ventricle.  Normal left ventricular ejection fraction of 56%.  Normal left ventricular wall motion.  Recent Labs: 12/06/2021: TSH 1.230 04/04/2022: BNP 44.2 04/13/2022: Magnesium 2.1 05/05/2022: ALT 17 05/21/2022: BUN 37; Creatinine, Ser 1.30; Hemoglobin 11.6; Platelets 226; Potassium 4.0; Sodium  130  Recent Lipid Panel    Component Value Date/Time   CHOL 150 05/05/2022 0938   TRIG 206 (H) 05/05/2022 0938   HDL 43 05/05/2022 0938   CHOLHDL 3.5 05/05/2022 0938   CHOLHDL 3.2 08/05/2019 0334   VLDL 19 08/05/2019 0334   LDLCALC 73 05/05/2022 0938   LDLCALC 118 (H) 02/06/2019 0813    Risk Assessment/Calculations:   CHA2DS2-VASc Score = 4   This indicates a 4.8% annual risk of stroke. The patient's score is based upon: CHF History: 0 HTN History: 1 Diabetes History: 1 Stroke  History: 0 Vascular Disease History: 1 Age Score: 1 Gender Score: 0    Home Medications    Current Meds  Medication Sig   acetaminophen (TYLENOL) 325 MG tablet Take 2 tablets (650 mg total) by mouth every 6 (six) hours as needed for mild pain (or Fever >/= 101).   albuterol (PROVENTIL) (2.5 MG/3ML) 0.083% nebulizer solution Take 3 mLs (2.5 mg total) by nebulization every 6 (six) hours as needed for up to 7 days for wheezing or shortness of breath.   albuterol (VENTOLIN HFA) 108 (90 Base) MCG/ACT inhaler Inhale 2 puffs into the lungs every 6 (six) hours as needed for wheezing or shortness of breath.   alfuzosin (UROXATRAL) 10 MG 24 hr tablet Take 10 mg by mouth daily.   Alpha Lipoic Acid-Biotin ER 600-450 MG-MCG TBCR Take 600 mg by mouth every morning.   apixaban (ELIQUIS) 5 MG TABS tablet Take 1 tablet (5 mg total) by mouth 2 (two) times daily. For stroke prevention   clobetasol cream (TEMOVATE) 0.05 % Apply thin amount 2 times daily prn to psoriasis not for face axilla or groin   Continuous Glucose Sensor (DEXCOM G7 SENSOR) MISC Change sensor every 10 days   ezetimibe (ZETIA) 10 MG tablet Take 1 tablet by mouth once daily   ferrous sulfate 325 (65 FE) MG EC tablet Take 1 tablet (325 mg total) by mouth daily with breakfast.   furosemide (LASIX) 40 MG tablet TAKE 2 TABLETS BY MOUTH IN THE MORNING AND 1 TABLET IN THE EVENING   Glucagon (GVOKE HYPOPEN 2-PACK) 1 MG/0.2ML SOAJ Inject 1 mg subcutaneously  once as needed for low blood sugar. May repeat dose in 15 minutes as needed using a new device.   insulin glargine, 2 Unit Dial, (TOUJEO MAX SOLOSTAR) 300 UNIT/ML Solostar Pen Inject 120 Units into the skin at bedtime.   Insulin Pen Needle (B-D ULTRAFINE III SHORT PEN) 31G X 8 MM MISC USE 1 PEN NEEDLE THREE TIMES DAILY   isosorbide mononitrate (IMDUR) 30 MG 24 hr tablet Take 1 tablet (30 mg total) by mouth daily.   levothyroxine (SYNTHROID) 137 MCG tablet TAKE 1 TABLET BY MOUTH ONCE DAILY BEFORE BREAKFAST   loratadine (CLARITIN) 10 MG tablet Take 10 mg by mouth daily as needed for allergies.    losartan (COZAAR) 50 MG tablet Take by mouth.   metoprolol succinate (TOPROL XL) 25 MG 24 hr tablet Take 1 tablet (25 mg total) by mouth 2 (two) times daily.   modafinil (PROVIGIL) 200 MG tablet Take 1 tablet by mouth once daily   nitroGLYCERIN (NITROSTAT) 0.4 MG SL tablet DISSOLVE 1 TABLET UNDER TONGUE EVERY 5 MINUTES UP TO 15 MIN FOR CHESTPAIN. IF NO RELIEF CALL 911.   ONE TOUCH ULTRA TEST test strip TEST BLOOD SUGAR UP TO 4 TIMES DAILY.   ONETOUCH DELICA LANCETS 33G MISC USE AS DIRECTED TO TEST BLOOD SUGAR 4 TIMES DAILY.   pravastatin (PRAVACHOL) 40 MG tablet TAKE 1 TABLET BY MOUTH ONCE DAILY IN THE EVENING   rOPINIRole (REQUIP XL) 4 MG 24 hr tablet Take 1 tablet (4 mg total) by mouth at bedtime.   rOPINIRole (REQUIP) 4 MG tablet Take 0.5 tablets (2 mg total) by mouth 2 (two) times daily.   Semaglutide, 1 MG/DOSE, 4 MG/3ML SOPN Inject 1 mg as directed once a week. (Patient taking differently: Inject 2 mg as directed once a week.)   TRELEGY ELLIPTA 100-62.5-25 MCG/ACT AEPB INHALE 1 PUFF ONCE DAILY    All other systems reviewed  and are otherwise negative except as noted above.  Physical Exam    VS:  BP (!) 142/82   Pulse 92   Ht 5\' 5"  (1.651 m)   Wt 227 lb 6.4 oz (103.1 kg)   SpO2 92%   BMI 37.84 kg/m  , BMI Body mass index is 37.84 kg/m.  Wt Readings from Last 3 Encounters:  06/19/22 227 lb  6.4 oz (103.1 kg)  06/15/22 225 lb 6.4 oz (102.2 kg)  06/06/22 228 lb 12.8 oz (103.8 kg)     GEN: Obese, 72 y.o. male in no acute distress. HEENT: normal. Neck: Supple, no JVD, carotid bruits, or masses. Cardiac: S1/S2, RRR, no murmurs, rubs, or gallops. No clubbing, cyanosis, edema.  Radials/PT 2+ and equal bilaterally.  Minimal swelling noted to right hand. Respiratory:  Respirations regular and unlabored, clear to auscultation bilaterally. Skin: Warm and dry, rash with Ace bandage noted to left forearm. Neuro:  Strength and sensation are intact. Psych: Normal affect.  Assessment & Plan    CAD Stable with no anginal symptoms. No indication for ischemic evaluation.  Cath in 2020 revealed nonobstructive CAD, no hx of PCI/stents. CCTA revealed moderate stenosis, negative FFR along mid LAD.  Will medically manage at this time.  Not on aspirin due to being on Plavix.  Continue Zetia, Toprol XL, pravastatin, Imdur, losartan, and NTG. Heart healthy diet encouraged. ED precautions discussed.  2. PAF Admits to brief palpitations. Continue metoprolol succinate 25 mg BID and may take extra tablet PRN for palpitations. No other medication changes. Heart healthy diet encouraged. Continue Eliquis, on appropriate dosage and denies any bleeding issues.   3. HTN BP on arrival 142/82, repeat BP 145/80. BP well controlled at home. Discussed to monitor BP at home at least 2 hours after medications and sitting for 5-10 minutes.  Continue current medication regimen at this time.  He will call our office in the next few weeks if SBP is not consistently less than 140. Heart healthy diet encouraged.  4. Carotid artery disease, HLD Last LDL 73. Carotid Doppler in October 2023 revealed mild bilateral ICA stenosis, 1 to 39%.  Continue current medication regimen. Heart healthy diet encouraged.   5. DOE Admits to stable DOE.  No medication changes at this time.  Continue follow-up with pulmonology and  PCP.  Disposition: Follow up in 6 months with Nona Dell, MD or APP.  Signed, Sharlene Dory, NP 06/19/2022, 3:21 PM [facility]

## 2022-06-23 DIAGNOSIS — E1165 Type 2 diabetes mellitus with hyperglycemia: Secondary | ICD-10-CM | POA: Diagnosis not present

## 2022-06-27 ENCOUNTER — Encounter (HOSPITAL_BASED_OUTPATIENT_CLINIC_OR_DEPARTMENT_OTHER): Payer: Self-pay | Admitting: Pulmonary Disease

## 2022-06-27 ENCOUNTER — Ambulatory Visit (HOSPITAL_BASED_OUTPATIENT_CLINIC_OR_DEPARTMENT_OTHER): Payer: Medicare HMO | Admitting: Pulmonary Disease

## 2022-06-27 VITALS — BP 132/60 | HR 94 | Temp 98.2°F | Ht 65.0 in | Wt 227.6 lb

## 2022-06-27 DIAGNOSIS — J9819 Other pulmonary collapse: Secondary | ICD-10-CM | POA: Diagnosis not present

## 2022-06-27 DIAGNOSIS — J4550 Severe persistent asthma, uncomplicated: Secondary | ICD-10-CM

## 2022-06-27 DIAGNOSIS — R0982 Postnasal drip: Secondary | ICD-10-CM

## 2022-06-27 DIAGNOSIS — U099 Post covid-19 condition, unspecified: Secondary | ICD-10-CM | POA: Diagnosis not present

## 2022-06-27 MED ORDER — FLUTICASONE PROPIONATE 50 MCG/ACT NA SUSP: 1.0000 | Freq: Every day | NASAL | 2 refills | Status: AC

## 2022-06-27 NOTE — Progress Notes (Signed)
[facility]  Chief Complaint  Patient presents with   Follow-up    Follow up. Patient has no complaints.      Constitutional:  BP 132/60 (BP Location: Right Arm, Patient Position: Sitting, Cuff Size: Normal)   Pulse 94   Temp 98.2 F (36.8 C) (Oral)   Ht 5\' 5"  (1.651 m)   Wt 227 lb 9.6 oz (103.2 kg)   SpO2 94%   BMI 37.87 kg/m   Past Medical History:  Rt subclavian vein stenosis, Fatty liver, Spinal stenosis, OSA, Non obstructive CAD, Colon polyps, DVT Rt arm, HA, HTN, Hypothyroidism, HLD, PNA, PE, Psoriasis, RLS, Septic arthritis Lt knee, DM, COVID 19 infection in 2022, Atrial fibrillation  Past Surgical History:  He  has a past surgical history that includes Total knee arthroplasty (2003); Lumbar disc surgery; Wrist surgery; Total knee revision (2005); Colonoscopy; Back surgery; Knee surgery; Total knee arthroplasty (Right, 03/23/2014); Tonsillectomy; Hip surgery; Eye surgery (Left, 2016); Cataract extraction (Bilateral); Knee arthrotomy (Right, 12/04/2014); Cyst removal trunk; LEFT HEART CATH AND CORONARY ANGIOGRAPHY (N/A, 01/17/2018); Colonoscopy with propofol (N/A, 01/31/2022); Esophagogastroduodenoscopy (egd) with propofol (N/A, 01/31/2022); biopsy (01/31/2022); and polypectomy (01/31/2022).  Brief Summary:  Paul Baker is a 72 y.o. male with asthma.      Subjective:   I last saw him in 2022.  He saw Tammy Parrett earlier this month for an asthma exacerbation.  Treated with levaquin and prednisone.  Chest xray from 06/06/22 was normal.  CBC from 05/21/22 showed Eos 0.3.  Cardiac CT from April 2024 showed Rt lower lower collapse/consolidation.  He still has a cough with thick, yellow sputum.  Has sinus congestion and post nasal drip.  Not having wheeze or fever.  Still gets fatigued easily and winded easily.  Physical Exam:   Appearance - well kempt   ENMT - no sinus tenderness, no oral exudate, no LAN, Mallampati 3 airway, no  stridor, clear nasal drainage  Respiratory - equal breath sounds bilaterally, no wheezing or rales  CV - s1s2 regular rate and rhythm, no murmurs  Ext - no clubbing, no edema  Skin - changes of psoriasis  Psych - normal mood and affect     Pulmonary testing:  Spirometry 10/17/11 >> 2.58 (89%), FEV1% 85 PFT 09/11/18 >> FEV1 2.19 (79%), FEV1% 82, TLC 4.48 (74%), DLCO 70%, +BD from change in FEF 25-75% PFT 09/20/20 >> FEV1 2.34 (87%), FEV1% 86, TLC 4.37 (72%), DLCO 94% FeNO 06/06/22 >> 18  Chest Imaging:  HRCT chest 12/19/18 >> atherosclerosis, patchy air trapping, fatty liver CT angio chest 05/23/20 >> lungs clear  Sleep Tests:  PSG 2012 >> AHI 60  Cardiac Tests:  LHC 01/17/18 >> non obstructive CAD Echo 05/03/22 >> EF 60 to 65%, mild LVH, grade 1 DD  Social History:  He  reports that he quit smoking about 27 years ago. His smoking use included cigarettes. He started smoking about 55 years ago. He has a 15.00 pack-year smoking history. He has never been exposed to tobacco smoke. He has never used smokeless tobacco. He reports that he does not drink alcohol and does not use drugs.  Family History:  His family history includes Diabetes in his sister; Heart attack in his father; Hypertension in his father; Kidney Stones in his father; Narcolepsy in his grandchild; Seizures in his grandchild.     Assessment/Plan:   Dyspnea on exertion. - could be related to long COVID - encouraged him to maintain a regular exercise  regimen  Severe, persistent asthma. - don't think he needs additional ABx or prednisone at this time - continue trelegy 100 one puff daily - prn albuterol - prn mucinex  Rt lower lobe atelectasis on cardiac CT chest from April 2024. - will arrange for CT chest without contrast to assess further  Upper airway cough with post nasal drip. - will have him use nasal irrigation and flonase   Coronary artery disease, Paroxysmal atrial fibrillation. - followed by Dr.  Diona Browner with cardiology   Obstructive sleep apnea, RLS. - [facility] at [facility]  [facility]. - followed by Dr. Kathrene Bongo with [facility]  Time Spent Involved in Patient Care on Day of Examination:  36 minutes  Follow up:   Patient Instructions  Will arrange for CT chest  Use nasal irrigation followed by flonase nightly for one week, then as needed  Follow up in 3 months  Medication List:   Allergies as of 06/27/2022       Reactions   Tape Rash   Pulls off skin   Farxiga [dapagliflozin]    Yeast infections, felt sick with it   Cardizem [diltiazem Hcl]    Edema    Cardura [doxazosin Mesylate]    Headaches / cramps   Crestor [rosuvastatin] Other (See Comments)   Leg cramps   Lipitor [atorvastatin] Other (See Comments)   Leg cramps   Paxil [paroxetine Hcl]    Unknown reaction    Codeine Rash, Other (See Comments)   Headache    Gabapentin Rash        Medication List        Accurate as of June 27, 2022 10:47 AM. If you have any questions, ask your nurse or doctor.          acetaminophen 325 MG tablet Commonly known as: TYLENOL Take 2 tablets (650 mg total) by mouth every 6 (six) hours as needed for mild pain (or Fever >/= 101).   albuterol 108 (90 Base) MCG/ACT inhaler Commonly known as: VENTOLIN HFA Inhale 2 puffs into the lungs every 6 (six) hours as needed for wheezing or shortness of breath.   albuterol (2.5 MG/3ML) 0.083% nebulizer solution Commonly known as: PROVENTIL Take 3 mLs (2.5 mg total) by nebulization every 6 (six) hours as needed for up to 7 days for wheezing or shortness of breath.   alfuzosin 10 MG 24 hr tablet Commonly known as: UROXATRAL Take 10 mg by mouth daily.   Alpha Lipoic Acid-Biotin ER 600-450 MG-MCG Tbcr Take 600 mg by mouth every morning.   apixaban 5 MG Tabs tablet Commonly known as: ELIQUIS Take 1 tablet (5 mg total) by mouth 2 (two) times daily. For stroke prevention   B-D ULTRAFINE III SHORT  PEN 31G X 8 MM Misc Generic drug: Insulin Pen Needle USE 1 PEN NEEDLE THREE TIMES DAILY   clobetasol cream 0.05 % Commonly known as: TEMOVATE Apply thin amount 2 times daily prn to psoriasis not for face axilla or groin   Dexcom G7 Sensor Misc Change sensor every 10 days   ezetimibe 10 MG tablet Commonly known as: ZETIA Take 1 tablet by mouth once daily   ferrous sulfate 325 (65 FE) MG EC tablet Take 1 tablet (325 mg total) by mouth daily with breakfast.   fluticasone 50 MCG/ACT nasal spray Commonly known as: FLONASE Place 1 spray into both nostrils daily. Started by: Coralyn Helling, MD   furosemide 40 MG tablet Commonly known as: LASIX TAKE 2 TABLETS BY MOUTH IN  THE MORNING AND 1 TABLET IN THE EVENING   Gvoke HypoPen 2-Pack 1 MG/0.2ML Soaj Generic drug: Glucagon Inject 1 mg subcutaneously once as needed for low blood sugar. May repeat dose in 15 minutes as needed using a new device.   isosorbide mononitrate 30 MG 24 hr tablet Commonly known as: IMDUR Take 1 tablet (30 mg total) by mouth daily.   levothyroxine 137 MCG tablet Commonly known as: SYNTHROID TAKE 1 TABLET BY MOUTH ONCE DAILY BEFORE BREAKFAST   loratadine 10 MG tablet Commonly known as: CLARITIN Take 10 mg by mouth daily as needed for allergies.   losartan 50 MG tablet Commonly known as: COZAAR Take by mouth.   metoprolol succinate 25 MG 24 hr tablet Commonly known as: Toprol XL Take 1 tablet (25 mg total) by mouth 2 (two) times daily.   modafinil 200 MG tablet Commonly known as: PROVIGIL Take 1 tablet by mouth once daily   nitroGLYCERIN 0.4 MG SL tablet Commonly known as: NITROSTAT DISSOLVE 1 TABLET UNDER TONGUE EVERY 5 MINUTES UP TO 15 MIN FOR CHESTPAIN. IF NO RELIEF CALL 911.   ONE TOUCH ULTRA TEST test strip Generic drug: glucose blood TEST BLOOD SUGAR UP TO 4 TIMES DAILY.   OneTouch Delica Lancets 33G Misc USE AS DIRECTED TO TEST BLOOD SUGAR 4 TIMES DAILY.   pravastatin 40 MG  tablet Commonly known as: PRAVACHOL TAKE 1 TABLET BY MOUTH ONCE DAILY IN THE EVENING   rOPINIRole 4 MG tablet Commonly known as: REQUIP Take 0.5 tablets (2 mg total) by mouth 2 (two) times daily.   rOPINIRole 4 MG 24 hr tablet Commonly known as: REQUIP XL Take 1 tablet (4 mg total) by mouth at bedtime.   Semaglutide (1 MG/DOSE) 4 MG/3ML Sopn Inject 1 mg as directed once a week. What changed: how much to take   Toujeo Max SoloStar 300 UNIT/ML Solostar Pen Generic drug: insulin glargine (2 Unit Dial) Inject 120 Units into the skin at bedtime.   Trelegy Ellipta 100-62.5-25 MCG/ACT Aepb Generic drug: Fluticasone-Umeclidin-Vilant INHALE 1 PUFF ONCE DAILY        Signature:  Coralyn Helling, MD [facility] Pulmonary/Critical Care Pager - [phone] 06/27/2022, 10:47 AM

## 2022-06-27 NOTE — Patient Instructions (Signed)
Will arrange for CT chest  Use nasal irrigation followed by flonase nightly for one week, then as needed  Follow up in 3 months

## 2022-06-29 ENCOUNTER — Encounter: Payer: Self-pay | Admitting: *Deleted

## 2022-07-08 ENCOUNTER — Ambulatory Visit (HOSPITAL_BASED_OUTPATIENT_CLINIC_OR_DEPARTMENT_OTHER)
Admission: RE | Admit: 2022-07-08 | Discharge: 2022-07-08 | Disposition: A | Discharge status: Home / Self Care | Payer: Medicare HMO | Source: Ambulatory Visit | Attending: Pulmonary Disease | Admitting: Pulmonary Disease

## 2022-07-08 ENCOUNTER — Other Ambulatory Visit: Payer: Self-pay | Admitting: Pulmonary Disease

## 2022-07-08 DIAGNOSIS — J9819 Other pulmonary collapse: Secondary | ICD-10-CM | POA: Insufficient documentation

## 2022-07-08 DIAGNOSIS — R0602 Shortness of breath: Secondary | ICD-10-CM | POA: Diagnosis not present

## 2022-07-08 DIAGNOSIS — I700 Atherosclerosis of aorta: Secondary | ICD-10-CM | POA: Diagnosis not present

## 2022-07-10 ENCOUNTER — Ambulatory Visit: Payer: Medicare HMO | Admitting: Nurse Practitioner

## 2022-07-10 DIAGNOSIS — E1159 Type 2 diabetes mellitus with other circulatory complications: Secondary | ICD-10-CM

## 2022-07-10 DIAGNOSIS — Z7985 Long-term (current) use of injectable non-insulin antidiabetic drugs: Secondary | ICD-10-CM

## 2022-07-10 DIAGNOSIS — I10 Essential (primary) hypertension: Secondary | ICD-10-CM

## 2022-07-10 DIAGNOSIS — E782 Mixed hyperlipidemia: Secondary | ICD-10-CM

## 2022-07-10 DIAGNOSIS — Z794 Long term (current) use of insulin: Secondary | ICD-10-CM

## 2022-07-10 DIAGNOSIS — E038 Other specified hypothyroidism: Secondary | ICD-10-CM

## 2022-07-11 ENCOUNTER — Other Ambulatory Visit: Payer: Self-pay

## 2022-07-11 ENCOUNTER — Ambulatory Visit: Payer: Medicare HMO | Admitting: Family Medicine

## 2022-07-11 ENCOUNTER — Emergency Department (HOSPITAL_COMMUNITY): Payer: Medicare HMO

## 2022-07-11 ENCOUNTER — Inpatient Hospital Stay (HOSPITAL_COMMUNITY)
Admission: EM | Admit: 2022-07-11 | Discharge: 2022-07-14 | DRG: 291 | Disposition: A | Discharge status: Home / Self Care | Payer: Medicare HMO | Attending: Family Medicine | Admitting: Family Medicine

## 2022-07-11 ENCOUNTER — Encounter (HOSPITAL_COMMUNITY): Payer: Self-pay | Admitting: *Deleted

## 2022-07-11 VITALS — BP 129/67 | HR 79 | Wt 231.0 lb

## 2022-07-11 DIAGNOSIS — I5033 Acute on chronic diastolic (congestive) heart failure: Secondary | ICD-10-CM

## 2022-07-11 DIAGNOSIS — I10 Essential (primary) hypertension: Secondary | ICD-10-CM

## 2022-07-11 DIAGNOSIS — E1159 Type 2 diabetes mellitus with other circulatory complications: Secondary | ICD-10-CM

## 2022-07-11 DIAGNOSIS — E11649 Type 2 diabetes mellitus with hypoglycemia without coma: Secondary | ICD-10-CM | POA: Diagnosis not present

## 2022-07-11 DIAGNOSIS — Z8249 Family history of ischemic heart disease and other diseases of the circulatory system: Secondary | ICD-10-CM

## 2022-07-11 DIAGNOSIS — I480 Paroxysmal atrial fibrillation: Secondary | ICD-10-CM | POA: Diagnosis not present

## 2022-07-11 DIAGNOSIS — Z820 Family history of epilepsy and other diseases of the nervous system: Secondary | ICD-10-CM

## 2022-07-11 DIAGNOSIS — Z888 Allergy status to other drugs, medicaments and biological substances status: Secondary | ICD-10-CM

## 2022-07-11 DIAGNOSIS — R0989 Other specified symptoms and signs involving the circulatory and respiratory systems: Secondary | ICD-10-CM | POA: Diagnosis not present

## 2022-07-11 DIAGNOSIS — G2581 Restless legs syndrome: Secondary | ICD-10-CM | POA: Diagnosis not present

## 2022-07-11 DIAGNOSIS — I25119 Atherosclerotic heart disease of native coronary artery with unspecified angina pectoris: Secondary | ICD-10-CM

## 2022-07-11 DIAGNOSIS — Z7901 Long term (current) use of anticoagulants: Secondary | ICD-10-CM

## 2022-07-11 DIAGNOSIS — N1831 Chronic kidney disease, stage 3a: Secondary | ICD-10-CM | POA: Diagnosis present

## 2022-07-11 DIAGNOSIS — L409 Psoriasis, unspecified: Secondary | ICD-10-CM | POA: Diagnosis present

## 2022-07-11 DIAGNOSIS — Z86711 Personal history of pulmonary embolism: Secondary | ICD-10-CM | POA: Diagnosis not present

## 2022-07-11 DIAGNOSIS — G4733 Obstructive sleep apnea (adult) (pediatric): Secondary | ICD-10-CM

## 2022-07-11 DIAGNOSIS — Z85828 Personal history of other malignant neoplasm of skin: Secondary | ICD-10-CM

## 2022-07-11 DIAGNOSIS — J849 Interstitial pulmonary disease, unspecified: Secondary | ICD-10-CM

## 2022-07-11 DIAGNOSIS — E1165 Type 2 diabetes mellitus with hyperglycemia: Secondary | ICD-10-CM | POA: Diagnosis not present

## 2022-07-11 DIAGNOSIS — J9811 Atelectasis: Secondary | ICD-10-CM | POA: Diagnosis not present

## 2022-07-11 DIAGNOSIS — Z794 Long term (current) use of insulin: Secondary | ICD-10-CM

## 2022-07-11 DIAGNOSIS — N179 Acute kidney failure, unspecified: Secondary | ICD-10-CM | POA: Diagnosis present

## 2022-07-11 DIAGNOSIS — I130 Hypertensive heart and chronic kidney disease with heart failure and stage 1 through stage 4 chronic kidney disease, or unspecified chronic kidney disease: Secondary | ICD-10-CM | POA: Diagnosis not present

## 2022-07-11 DIAGNOSIS — R0600 Dyspnea, unspecified: Secondary | ICD-10-CM | POA: Diagnosis not present

## 2022-07-11 DIAGNOSIS — Z87891 Personal history of nicotine dependence: Secondary | ICD-10-CM

## 2022-07-11 DIAGNOSIS — E669 Obesity, unspecified: Secondary | ICD-10-CM | POA: Diagnosis present

## 2022-07-11 DIAGNOSIS — I2510 Atherosclerotic heart disease of native coronary artery without angina pectoris: Secondary | ICD-10-CM | POA: Diagnosis present

## 2022-07-11 DIAGNOSIS — J90 Pleural effusion, not elsewhere classified: Secondary | ICD-10-CM | POA: Diagnosis not present

## 2022-07-11 DIAGNOSIS — Z6839 Body mass index (BMI) 39.0-39.9, adult: Secondary | ICD-10-CM

## 2022-07-11 DIAGNOSIS — R1013 Epigastric pain: Secondary | ICD-10-CM | POA: Diagnosis not present

## 2022-07-11 DIAGNOSIS — G253 Myoclonus: Secondary | ICD-10-CM

## 2022-07-11 DIAGNOSIS — E039 Hypothyroidism, unspecified: Secondary | ICD-10-CM

## 2022-07-11 DIAGNOSIS — Z79890 Hormone replacement therapy (postmenopausal): Secondary | ICD-10-CM

## 2022-07-11 DIAGNOSIS — I509 Heart failure, unspecified: Principal | ICD-10-CM

## 2022-07-11 DIAGNOSIS — Z8709 Personal history of other diseases of the respiratory system: Secondary | ICD-10-CM

## 2022-07-11 DIAGNOSIS — R059 Cough, unspecified: Secondary | ICD-10-CM

## 2022-07-11 DIAGNOSIS — R109 Unspecified abdominal pain: Secondary | ICD-10-CM

## 2022-07-11 DIAGNOSIS — E782 Mixed hyperlipidemia: Secondary | ICD-10-CM | POA: Diagnosis present

## 2022-07-11 DIAGNOSIS — Z7985 Long-term (current) use of injectable non-insulin antidiabetic drugs: Secondary | ICD-10-CM

## 2022-07-11 DIAGNOSIS — J984 Other disorders of lung: Secondary | ICD-10-CM | POA: Diagnosis not present

## 2022-07-11 DIAGNOSIS — Z91048 Other nonmedicinal substance allergy status: Secondary | ICD-10-CM

## 2022-07-11 DIAGNOSIS — Z86718 Personal history of other venous thrombosis and embolism: Secondary | ICD-10-CM

## 2022-07-11 DIAGNOSIS — I110 Hypertensive heart disease with heart failure: Secondary | ICD-10-CM | POA: Diagnosis not present

## 2022-07-11 DIAGNOSIS — Z885 Allergy status to narcotic agent status: Secondary | ICD-10-CM

## 2022-07-11 DIAGNOSIS — E1122 Type 2 diabetes mellitus with diabetic chronic kidney disease: Secondary | ICD-10-CM | POA: Diagnosis present

## 2022-07-11 DIAGNOSIS — Z96651 Presence of right artificial knee joint: Secondary | ICD-10-CM | POA: Diagnosis present

## 2022-07-11 DIAGNOSIS — J45909 Unspecified asthma, uncomplicated: Secondary | ICD-10-CM | POA: Diagnosis present

## 2022-07-11 DIAGNOSIS — Z7951 Long term (current) use of inhaled steroids: Secondary | ICD-10-CM

## 2022-07-11 DIAGNOSIS — Z79899 Other long term (current) drug therapy: Secondary | ICD-10-CM

## 2022-07-11 DIAGNOSIS — Z833 Family history of diabetes mellitus: Secondary | ICD-10-CM

## 2022-07-11 LAB — URINALYSIS, ROUTINE W REFLEX MICROSCOPIC
Bilirubin Urine: NEGATIVE
Glucose, UA: 150 mg/dL — AB
Hgb urine dipstick: NEGATIVE
Ketones, ur: NEGATIVE mg/dL
Leukocytes,Ua: NEGATIVE
Nitrite: NEGATIVE
Protein, ur: NEGATIVE mg/dL
Specific Gravity, Urine: 1.026 (ref 1.005–1.030)
pH: 5 (ref 5.0–8.0)

## 2022-07-11 LAB — CBC WITH DIFFERENTIAL/PLATELET
Abs Immature Granulocytes: 0.08 10*3/uL — ABNORMAL HIGH (ref 0.00–0.07)
Basophils Absolute: 0.1 10*3/uL (ref 0.0–0.1)
Basophils Relative: 1 %
Eosinophils Absolute: 0.2 10*3/uL (ref 0.0–0.5)
Eosinophils Relative: 2 %
HCT: 30.5 % — ABNORMAL LOW (ref 39.0–52.0)
Hemoglobin: 9.8 g/dL — ABNORMAL LOW (ref 13.0–17.0)
Immature Granulocytes: 1 %
Lymphocytes Relative: 12 %
Lymphs Abs: 1.1 10*3/uL (ref 0.7–4.0)
MCH: 29.4 pg (ref 26.0–34.0)
MCHC: 32.1 g/dL (ref 30.0–36.0)
MCV: 91.6 fL (ref 80.0–100.0)
Monocytes Absolute: 1.2 10*3/uL — ABNORMAL HIGH (ref 0.1–1.0)
Monocytes Relative: 13 %
Neutro Abs: 6.3 10*3/uL (ref 1.7–7.7)
Neutrophils Relative %: 71 %
Platelets: 249 10*3/uL (ref 150–400)
RBC: 3.33 MIL/uL — ABNORMAL LOW (ref 4.22–5.81)
RDW: 14 % (ref 11.5–15.5)
WBC: 8.9 10*3/uL (ref 4.0–10.5)
nRBC: 0 % (ref 0.0–0.2)

## 2022-07-11 LAB — COMPREHENSIVE METABOLIC PANEL
ALT: 16 U/L (ref 0–44)
AST: 14 U/L — ABNORMAL LOW (ref 15–41)
Albumin: 3.6 g/dL (ref 3.5–5.0)
Alkaline Phosphatase: 57 U/L (ref 38–126)
Anion gap: 10 (ref 5–15)
BUN: 30 mg/dL — ABNORMAL HIGH (ref 8–23)
CO2: 23 mmol/L (ref 22–32)
Calcium: 8.7 mg/dL — ABNORMAL LOW (ref 8.9–10.3)
Chloride: 101 mmol/L (ref 98–111)
Creatinine, Ser: 1.33 mg/dL — ABNORMAL HIGH (ref 0.61–1.24)
GFR, Estimated: 57 mL/min — ABNORMAL LOW (ref >60)
Glucose, Bld: 369 mg/dL — ABNORMAL HIGH (ref 70–99)
Potassium: 4 mmol/L (ref 3.5–5.1)
Sodium: 134 mmol/L — ABNORMAL LOW (ref 135–145)
Total Bilirubin: 0.8 mg/dL (ref 0.3–1.2)
Total Protein: 7.1 g/dL (ref 6.5–8.1)

## 2022-07-11 LAB — TROPONIN I (HIGH SENSITIVITY)
Troponin I (High Sensitivity): 5 ng/L (ref <18)
Troponin I (High Sensitivity): 4 ng/L (ref <18)

## 2022-07-11 LAB — PHOSPHORUS: Phosphorus: 3.8 mg/dL (ref 2.5–4.6)

## 2022-07-11 LAB — LIPASE, BLOOD: Lipase: 53 U/L — ABNORMAL HIGH (ref 11–51)

## 2022-07-11 LAB — GLUCOSE, CAPILLARY: Glucose-Capillary: 124 mg/dL — ABNORMAL HIGH (ref 70–99)

## 2022-07-11 LAB — BRAIN NATRIURETIC PEPTIDE: B Natriuretic Peptide: 81 pg/mL (ref 0.0–100.0)

## 2022-07-11 LAB — MAGNESIUM: Magnesium: 2.2 mg/dL (ref 1.7–2.4)

## 2022-07-11 LAB — HEMOGLOBIN A1C
Hgb A1c MFr Bld: 8.9 % — ABNORMAL HIGH (ref 4.8–5.6)
Mean Plasma Glucose: 208.73 mg/dL

## 2022-07-11 LAB — TSH: TSH: 2.37 u[IU]/mL (ref 0.350–4.500)

## 2022-07-11 LAB — CBG MONITORING, ED: Glucose-Capillary: 158 mg/dL — ABNORMAL HIGH (ref 70–99)

## 2022-07-11 MED ORDER — LORATADINE 10 MG PO TABS: 10.0000 mg | ORAL_TABLET | Freq: Every day | ORAL | Status: DC | PRN

## 2022-07-11 MED ORDER — FLUTICASONE FUROATE-VILANTEROL 100-25 MCG/ACT IN AEPB
1.0000 | INHALATION_SPRAY | Freq: Every day | RESPIRATORY_TRACT | Status: DC
  Administered 2022-07-12 – 2022-07-14 (×3): 1 via RESPIRATORY_TRACT
  Filled 2022-07-11: qty 28

## 2022-07-11 MED ORDER — ACETAMINOPHEN 650 MG RE SUPP: 650.0000 mg | Freq: Four times a day (QID) | RECTAL | Status: DC | PRN

## 2022-07-11 MED ORDER — FENTANYL CITRATE PF 50 MCG/ML IJ SOSY
50.0000 ug | PREFILLED_SYRINGE | Freq: Once | INTRAMUSCULAR | Status: AC
  Administered 2022-07-11: 50 ug via INTRAVENOUS
  Filled 2022-07-11: qty 1

## 2022-07-11 MED ORDER — SODIUM CHLORIDE 0.9% FLUSH: 3.0000 mL | INTRAVENOUS | Status: DC | PRN

## 2022-07-11 MED ORDER — ISOSORBIDE MONONITRATE ER 60 MG PO TB24
30.0000 mg | ORAL_TABLET | Freq: Every day | ORAL | Status: DC
  Administered 2022-07-12 – 2022-07-14 (×3): 30 mg via ORAL
  Filled 2022-07-11 (×3): qty 1

## 2022-07-11 MED ORDER — EZETIMIBE 10 MG PO TABS: 10.0000 mg | ORAL_TABLET | Freq: Every day | ORAL | Status: DC

## 2022-07-11 MED ORDER — TRELEGY ELLIPTA 100-62.5-25 MCG/ACT IN AEPB: 1.0000 | INHALATION_SPRAY | Freq: Every day | RESPIRATORY_TRACT | 0 refills | Status: DC

## 2022-07-11 MED ORDER — IPRATROPIUM-ALBUTEROL 0.5-2.5 (3) MG/3ML IN SOLN: 3.0000 mL | Freq: Four times a day (QID) | RESPIRATORY_TRACT | Status: DC | PRN

## 2022-07-11 MED ORDER — FUROSEMIDE 10 MG/ML IJ SOLN
80.0000 mg | Freq: Once | INTRAMUSCULAR | Status: AC
  Administered 2022-07-11: 80 mg via INTRAVENOUS
  Filled 2022-07-11: qty 8

## 2022-07-11 MED ORDER — INSULIN GLARGINE-YFGN 100 UNIT/ML ~~LOC~~ SOLN
35.0000 [IU] | Freq: Every day | SUBCUTANEOUS | Status: DC
  Administered 2022-07-11: 35 [IU] via SUBCUTANEOUS
  Filled 2022-07-11 (×2): qty 0.35

## 2022-07-11 MED ORDER — IOHEXOL 350 MG/ML SOLN
100.0000 mL | Freq: Once | INTRAVENOUS | Status: AC | PRN
  Administered 2022-07-11: 100 mL via INTRAVENOUS

## 2022-07-11 MED ORDER — SODIUM CHLORIDE 0.9 % IV SOLN: 250.0000 mL | INTRAVENOUS | Status: DC | PRN

## 2022-07-11 MED ORDER — ONDANSETRON HCL 4 MG/2ML IJ SOLN: 4.0000 mg | Freq: Four times a day (QID) | INTRAMUSCULAR | Status: DC | PRN

## 2022-07-11 MED ORDER — ALFUZOSIN HCL ER 10 MG PO TB24
10.0000 mg | ORAL_TABLET | Freq: Every day | ORAL | Status: DC
  Administered 2022-07-12 – 2022-07-14 (×3): 10 mg via ORAL
  Filled 2022-07-11 (×3): qty 1

## 2022-07-11 MED ORDER — UMECLIDINIUM BROMIDE 62.5 MCG/ACT IN AEPB
1.0000 | INHALATION_SPRAY | Freq: Every day | RESPIRATORY_TRACT | Status: DC
  Administered 2022-07-12 – 2022-07-14 (×3): 1 via RESPIRATORY_TRACT
  Filled 2022-07-11: qty 7

## 2022-07-11 MED ORDER — MODAFINIL 200 MG PO TABS
200.0000 mg | ORAL_TABLET | Freq: Every day | ORAL | Status: DC
  Administered 2022-07-12 – 2022-07-14 (×3): 200 mg via ORAL
  Filled 2022-07-11 (×3): qty 1

## 2022-07-11 MED ORDER — AZITHROMYCIN 250 MG PO TABS
500.0000 mg | ORAL_TABLET | Freq: Every day | ORAL | Status: DC
  Administered 2022-07-11 – 2022-07-14 (×4): 500 mg via ORAL
  Filled 2022-07-11 (×4): qty 2

## 2022-07-11 MED ORDER — ONDANSETRON HCL 4 MG PO TABS: 4.0000 mg | ORAL_TABLET | Freq: Four times a day (QID) | ORAL | Status: DC | PRN

## 2022-07-11 MED ORDER — LEVOTHYROXINE SODIUM 137 MCG PO TABS
137.0000 ug | ORAL_TABLET | Freq: Every day | ORAL | Status: DC
  Administered 2022-07-12 – 2022-07-14 (×3): 137 ug via ORAL
  Filled 2022-07-11 (×3): qty 1

## 2022-07-11 MED ORDER — ONDANSETRON HCL 4 MG/2ML IJ SOLN
4.0000 mg | Freq: Once | INTRAMUSCULAR | Status: AC
  Administered 2022-07-11: 4 mg via INTRAVENOUS
  Filled 2022-07-11: qty 2

## 2022-07-11 MED ORDER — PRAVASTATIN SODIUM 40 MG PO TABS
40.0000 mg | ORAL_TABLET | Freq: Every evening | ORAL | Status: DC
  Administered 2022-07-11 – 2022-07-13 (×3): 40 mg via ORAL
  Filled 2022-07-11 (×3): qty 1

## 2022-07-11 MED ORDER — SODIUM CHLORIDE 0.9% FLUSH
3.0000 mL | Freq: Two times a day (BID) | INTRAVENOUS | Status: DC
  Administered 2022-07-11 – 2022-07-14 (×7): 3 mL via INTRAVENOUS

## 2022-07-11 MED ORDER — FERROUS SULFATE 325 (65 FE) MG PO TABS
325.0000 mg | ORAL_TABLET | Freq: Every day | ORAL | Status: DC
  Administered 2022-07-12 – 2022-07-14 (×3): 325 mg via ORAL
  Filled 2022-07-11 (×3): qty 1

## 2022-07-11 MED ORDER — MORPHINE SULFATE (PF) 4 MG/ML IV SOLN
4.0000 mg | Freq: Once | INTRAVENOUS | Status: AC
  Administered 2022-07-11: 4 mg via INTRAVENOUS
  Filled 2022-07-11: qty 1

## 2022-07-11 MED ORDER — ALBUTEROL SULFATE (2.5 MG/3ML) 0.083% IN NEBU: 2.5000 mg | INHALATION_SOLUTION | Freq: Four times a day (QID) | RESPIRATORY_TRACT | Status: DC | PRN

## 2022-07-11 MED ORDER — APIXABAN 5 MG PO TABS
5.0000 mg | ORAL_TABLET | Freq: Two times a day (BID) | ORAL | Status: DC
  Administered 2022-07-11 – 2022-07-14 (×6): 5 mg via ORAL
  Filled 2022-07-11 (×6): qty 1

## 2022-07-11 MED ORDER — INSULIN GLARGINE-YFGN 100 UNIT/ML ~~LOC~~ SOLN
18.0000 [IU] | Freq: Every day | SUBCUTANEOUS | Status: DC
  Filled 2022-07-11: qty 0.18

## 2022-07-11 MED ORDER — INSULIN ASPART 100 UNIT/ML IJ SOLN: 0.0000 [IU] | Freq: Every day | INTRAMUSCULAR | Status: DC

## 2022-07-11 MED ORDER — PREDNISONE 20 MG PO TABS
40.0000 mg | ORAL_TABLET | Freq: Every day | ORAL | Status: DC
  Administered 2022-07-12 – 2022-07-14 (×3): 40 mg via ORAL
  Filled 2022-07-11 (×2): qty 2

## 2022-07-11 MED ORDER — ACETAMINOPHEN 325 MG PO TABS
650.0000 mg | ORAL_TABLET | Freq: Four times a day (QID) | ORAL | Status: DC | PRN
  Administered 2022-07-13: 650 mg via ORAL
  Filled 2022-07-11: qty 2

## 2022-07-11 MED ORDER — HYDROMORPHONE HCL 1 MG/ML IJ SOLN
1.0000 mg | INTRAMUSCULAR | Status: DC | PRN
  Administered 2022-07-11 – 2022-07-13 (×5): 1 mg via INTRAVENOUS
  Filled 2022-07-11 (×5): qty 1

## 2022-07-11 MED ORDER — INSULIN ASPART 100 UNIT/ML IJ SOLN
0.0000 [IU] | Freq: Three times a day (TID) | INTRAMUSCULAR | Status: DC
  Administered 2022-07-11: 3 [IU] via SUBCUTANEOUS
  Administered 2022-07-12: 5 [IU] via SUBCUTANEOUS

## 2022-07-11 MED ORDER — ROPINIROLE HCL ER 4 MG PO TB24
4.0000 mg | ORAL_TABLET | Freq: Every day | ORAL | Status: DC
  Administered 2022-07-12 – 2022-07-13 (×2): 4 mg via ORAL
  Filled 2022-07-11 (×6): qty 1

## 2022-07-11 MED ORDER — LIDOCAINE VISCOUS HCL 2 % MT SOLN: 15.0000 mL | Freq: Once | OROMUCOSAL | Status: DC

## 2022-07-11 MED ORDER — METOLAZONE 5 MG PO TABS
2.5000 mg | ORAL_TABLET | Freq: Every day | ORAL | Status: AC
  Administered 2022-07-11 – 2022-07-12 (×2): 2.5 mg via ORAL
  Filled 2022-07-11 (×2): qty 1

## 2022-07-11 MED ORDER — METOPROLOL SUCCINATE ER 25 MG PO TB24
25.0000 mg | ORAL_TABLET | Freq: Two times a day (BID) | ORAL | Status: DC
  Administered 2022-07-11 – 2022-07-14 (×6): 25 mg via ORAL
  Filled 2022-07-11 (×6): qty 1

## 2022-07-11 MED ORDER — ALUM & MAG HYDROXIDE-SIMETH 200-200-20 MG/5ML PO SUSP: 30.0000 mL | Freq: Once | ORAL | Status: DC

## 2022-07-11 MED ORDER — ROPINIROLE HCL 1 MG PO TABS
2.0000 mg | ORAL_TABLET | Freq: Two times a day (BID) | ORAL | Status: DC
  Administered 2022-07-11 – 2022-07-14 (×6): 2 mg via ORAL
  Filled 2022-07-11 (×7): qty 2

## 2022-07-11 MED ORDER — FUROSEMIDE 10 MG/ML IJ SOLN
80.0000 mg | Freq: Two times a day (BID) | INTRAMUSCULAR | Status: DC
  Administered 2022-07-11 – 2022-07-12 (×3): 80 mg via INTRAVENOUS
  Filled 2022-07-11 (×3): qty 8

## 2022-07-11 NOTE — Assessment & Plan Note (Signed)
-  Patient reporting shortness of breath, orthopnea and increased 10 pounds weight gain in the last week or so prior to admission. -BNP not elevated but could be misleading in the setting of obesity -Mild bronchitic changes appreciated on x-ray. -Will treat with IV Lasix and metolazone; close monitoring of patient's volume status with daily weights/strict I's and O's and pursue low-sodium diet. -Probably will benefit of being discharged on Demadex (as he was already taking 40 mg twice a day of Lasix without improvement or control of his condition). -Recent echo demonstrating preserved ejection fraction and grade 1 diastolic dysfunction.

## 2022-07-11 NOTE — Assessment & Plan Note (Signed)
-  Given bronchitic changes, complains of productive coughing spells (yellow sputum), he might be experiencing mild exacerbation. -Resume home bronchodilator management; 5 days of prednisone 40 mg and a short course of Zithromax will be provided -Not requiring oxygen supplementation currently -Follow clinical response.

## 2022-07-11 NOTE — Progress Notes (Signed)
   Subjective:    Patient ID: Paul Baker, male    DOB: 1950/06/16, 72 y.o.   MRN: [id number]  HPI Patient arrives today with swollen area on left side of chest. Patient states he is SOB.  He relates that he recently saw pulmonologist and was treated for evaluation of atelectasis they did a plain CT scan without contrast the result of this has not been read yet due to radiology shortages of radiologist He relates that he felt fine on Saturday when he had a scan but on Sunday had onset of severe pleuritic sharp pain in the left side of his chest hurts with a deep breath in addition to this upper abdominal and mid abdominal pain and discomfort that is causing his abdomen to feel very tight and uncomfortable he denies sweats chills fevers.  He does relate shortness of breath with activity increased shortness compared to normal  He does have known history of pulmonary embolus in the past, atrial fibs, he is on Eliquis currently and is consistent with taking it  Also has history of diabetes and is on GLP-1  Review of Systems     Objective:   Physical Exam  General-in no acute distress Eyes-no discharge Lungs-respiratory rate normal, CTA CV-no murmurs Extremities skin warm dry no edema Neuro grossly normal Behavior normal, alert She does have significant obesity with tenderness in the left upper abdomen left lower chest wall epigastric region and left mid quadrant of the abdomen his abdomen is swollen and tender        Assessment & Plan:  1. Coronary artery disease involving native coronary artery of native heart with angina pectoris (HCC) I doubt that he is having a heart attack currently his pain is more pleuritic chest pain Whether or not to do troponin we will leave that up to ER  2. Essential hypertension, benign Blood pressure reasonable  3. DM type 2 causing vascular disease (HCC) He is on GLP-1 medicine GLP-1 medicines can cause pancreatitis would recommend  additional lab evaluation for that  4. Paroxysmal atrial fibrillation (HCC) Decent control currently  5. Left lateral abdominal pain Significant left abdominal pain and discomfort, cannot rule out the possibility of retroperitoneal hemorrhage with Eliquis, pancreatitis, colitis, doubtful for diverticulitis because his colonoscopy did not show diverticula, may well need lab work and scan to be more definitive  6. History of pulmonary embolism Although the likelihood of blood clot is low on Eliquis cannot rule this out totally  7. Atelectasis of left lung Await the findings of the recent CAT scan  I did review this case with ER doctor.  They will do further evaluation we will provide close follow-up

## 2022-07-11 NOTE — Assessment & Plan Note (Signed)
-  Most recent A1c over 95 days ago 8.9 demonstrating poor control. -Update A1c level -Continue sliding scale insulin and the use of Semglee -Follow CBGs fluctuation and adjust hypoglycemic regimen as needed.

## 2022-07-11 NOTE — Assessment & Plan Note (Addendum)
-  update TSH -Continue Synthroid.

## 2022-07-11 NOTE — Assessment & Plan Note (Signed)
-  Continue treatment with the use of Requip as previously prescribed.

## 2022-07-11 NOTE — ED Triage Notes (Signed)
Pt c/o left side chest pain x 2 days with sob  Pt states the pain woke him from sleep and he is coughing up yellow in color sputum

## 2022-07-11 NOTE — ED Provider Notes (Signed)
[facility] MEDICAL SURGICAL UNIT Provider Note  CSN: [id number] Arrival date & time: 07/11/22 1001  Chief Complaint(s) Shortness of Breath  HPI Paul Baker is a 72 y.o. male with PMH A-fib, upper extremity DVTs and pulmonary emboli on Eliquis with good compliance, T2DM on a GLP-1 who presents emergency department for evaluation of multiple complaints including chest and abdominal pain.  Patient states that for the last 3 to 4 days he has had pain primarily on the left side of his body over the chest wall and into the abdomen.  Pain interfering with sleep and causing mild shortness of breath when taking deep breaths.  Denies associated vomiting, nausea, diarrhea or other systemic symptoms.  He went and saw his primary care physician today who transferred him to the emergency department for a PE rule out and concern for acute intra-abdominal pathology.  Here in the emergency room, patient does appear uncomfortable and is endorsing left upper quadrant/left lower chest wall pain.  Of note, patient states that despite taking his 80 mg p.o. Lasix he has had a 10 pound weight gain this week and is having significant orthopnea.   Past Medical History Past Medical History:  Diagnosis Date   Arthritis    Asthma    Atrial fibrillation [facility])    Diagnosed December 2021   Colon polyp    Coronary atherosclerosis of native coronary artery    a. 2011: cath showing 90% stenosis along small non-dominant RCA (too small for PCI). b. 01/2018: cath showing nonobstructive CAD with 60 to 70% proximal to mid nondominant RCA stenosis, 50% mid LAD and 40 to 50% OM1   DVT (deep venous thrombosis) (HCC) 2005   Right arm   Essential hypertension    Headache    History of transfusion    Hypothyroidism    Mixed hyperlipidemia    Morbid obesity (HCC)    MRSA (methicillin resistant staph aureus) culture positive    08/2012   Narcolepsy    OSA (obstructive sleep apnea)    CPAP   Osteoarthritis    Pneumonia     Psoriasis    Pulmonary embolism (HCC) 2004   RLS (restless legs syndrome)    Rotator cuff disorder    Left   Septic arthritis of knee, left (HCC)    Skin cancer, basal cell    Type 2 diabetes mellitus (HCC)    Patient Active Problem List   Diagnosis Date Noted   Acute on chronic diastolic HF (heart failure) (HCC) 07/11/2022   Cellulitis of right hand 05/23/2022   Interstitial pulmonary disease (HCC) 05/04/2022   Elevated serum immunoglobulin free light chains 04/21/2022   Normocytic anemia 04/21/2022   Severe persistent asthma without complication 02/24/2022   Carotid artery disease, unspecified laterality, unspecified type (HCC) 02/24/2022   Grade I diastolic dysfunction 12/05/2019   Paroxysmal atrial fibrillation (HCC)    Hypothyroidism    Asthma 08/05/2019   Class 2 obesity 08/05/2019   Mixed hyperlipidemia 11/25/2014   S/P knee replacement 03/23/2014   Spinal stenosis of cervicothoracic region 10/08/2013   RLS (restless legs syndrome) 10/08/2013   DM type 2 causing vascular disease (HCC) 10/08/2013   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/02/2013   Diabetic neuropathy (HCC) 07/23/2012   OSA on CPAP 04/19/2012   Essential hypertension, benign 12/16/2009   Coronary artery disease involving native coronary artery of native heart with angina pectoris (HCC) 12/16/2009   Hyperglycemia due to type 2 diabetes mellitus (HCC) 11/30/2009   Home Medication(s)  Prior to Admission medications   Medication Sig Start Date End Date Taking? Authorizing Provider  acetaminophen (TYLENOL) 325 MG tablet Take 2 tablets (650 mg total) by mouth every 6 (six) hours as needed for mild pain (or Fever >/= 101). Patient taking differently: Take 1,000 mg by mouth every 6 (six) hours as needed for mild pain (or Fever >/= 101). 12/05/19  Yes Emokpae, Courage, MD  albuterol (PROVENTIL) (2.5 MG/3ML) 0.083% nebulizer solution Take 3 mLs (2.5 mg total) by nebulization every 6 (six) hours as needed  for up to 7 days for wheezing or shortness of breath. 12/18/20  Yes Carroll Sage, PA-C  albuterol (VENTOLIN HFA) 108 (90 Base) MCG/ACT inhaler Inhale 2 puffs into the lungs every 6 (six) hours as needed for wheezing or shortness of breath. 06/03/20  Yes Parrett, Tammy S, NP  alfuzosin (UROXATRAL) 10 MG 24 hr tablet Take 10 mg by mouth daily. 03/02/21  Yes [provider]  Alpha Lipoic Acid-Biotin ER 600-450 MG-MCG TBCR Take 600 mg by mouth every morning. Patient taking differently: Take 600 mg by mouth at bedtime. 05/04/22  Yes Dohmeier, Porfirio Mylar, MD  apixaban (ELIQUIS) 5 MG TABS tablet Take 1 tablet (5 mg total) by mouth 2 (two) times daily. For stroke prevention 04/07/22  Yes Sharlene Dory, NP  clobetasol cream (TEMOVATE) 0.05 % Apply thin amount 2 times daily prn to psoriasis not for face axilla or groin 05/26/22  Yes Luking, Jonna Coup, MD  ferrous sulfate 325 (65 FE) MG EC tablet Take 1 tablet (325 mg total) by mouth daily with breakfast. Patient taking differently: Take 325 mg by mouth at bedtime. 05/04/22  Yes Georga Kaufmann T, PA-C  FIASP FLEXTOUCH 100 UNIT/ML FlexTouch Pen Inject 30 Units into the skin in the morning, at noon, and at bedtime. 30-38 units per sliding scale 06/20/22  Yes [provider]  fluticasone (FLONASE) 50 MCG/ACT nasal spray Place 1 spray into both nostrils daily. 06/27/22  Yes Coralyn Helling, MD  Fluticasone-Umeclidin-Vilant (TRELEGY ELLIPTA) 100-62.5-25 MCG/ACT AEPB Inhale 1 puff into the lungs daily. 07/11/22  Yes Coralyn Helling, MD  furosemide (LASIX) 40 MG tablet TAKE 2 TABLETS BY MOUTH IN THE MORNING AND 1 TABLET IN THE EVENING 05/30/22  Yes Sharlene Dory, NP  Glucagon (GVOKE HYPOPEN 2-PACK) 1 MG/0.2ML SOAJ Inject 1 mg subcutaneously once as needed for low blood sugar. May repeat dose in 15 minutes as needed using a new device. 01/28/21  Yes Luking, Scott A, MD  insulin glargine, 2 Unit Dial, (TOUJEO MAX SOLOSTAR) 300 UNIT/ML Solostar Pen Inject 120 Units into the  skin at bedtime. 05/22/22  Yes Dani Gobble, NP  isosorbide mononitrate (IMDUR) 30 MG 24 hr tablet Take 1 tablet (30 mg total) by mouth daily. Patient taking differently: Take 15 mg by mouth daily. 15mg  AM and 15 PM 05/19/22 11/15/22 Yes Sharlene Dory, NP  levothyroxine (SYNTHROID) 137 MCG tablet TAKE 1 TABLET BY MOUTH ONCE DAILY BEFORE BREAKFAST 03/28/22  Yes Dani Gobble, NP  loratadine (CLARITIN) 10 MG tablet Take 10 mg by mouth daily as needed for allergies.    Yes [provider]  losartan (COZAAR) 50 MG tablet Take 50 mg by mouth daily. 08/09/15  Yes [provider]  metoprolol succinate (TOPROL XL) 25 MG 24 hr tablet Take 1 tablet (25 mg total) by mouth 2 (two) times daily. 05/19/22  Yes Sharlene Dory, NP  modafinil (PROVIGIL) 200 MG tablet Take 1 tablet by mouth once daily 05/15/22  Yes Dohmeier, Porfirio Mylar,  MD  nitroGLYCERIN (NITROSTAT) 0.4 MG SL tablet DISSOLVE 1 TABLET UNDER TONGUE EVERY 5 MINUTES UP TO 15 MIN FOR CHESTPAIN. IF NO RELIEF CALL 911. 05/19/22  Yes Sharlene Dory, NP  pravastatin (PRAVACHOL) 40 MG tablet TAKE 1 TABLET BY MOUTH ONCE DAILY IN THE EVENING 12/27/21  Yes Jonelle Sidle, MD  rOPINIRole (REQUIP XL) 4 MG 24 hr tablet Take 1 tablet (4 mg total) by mouth at bedtime. 06/05/22  Yes Dohmeier, Porfirio Mylar, MD  rOPINIRole (REQUIP) 4 MG tablet Take 0.5 tablets (2 mg total) by mouth 2 (two) times daily. 06/05/22  Yes Dohmeier, Porfirio Mylar, MD  Semaglutide, 1 MG/DOSE, 4 MG/3ML SOPN Inject 1 mg as directed once a week. 03/07/21  Yes Dani Gobble, NP  Continuous Glucose Sensor (DEXCOM G7 SENSOR) MISC Change sensor every 10 days 05/22/22   Dani Gobble, NP  ezetimibe (ZETIA) 10 MG tablet Take 1 tablet by mouth once daily Patient not taking: Reported on 07/11/2022 04/17/22   [name], MD  Insulin Pen Needle (B-D ULTRAFINE III SHORT PEN) 31G X 8 MM MISC USE 1 PEN NEEDLE THREE TIMES DAILY 01/19/22   Dani Gobble, NP  ONE TOUCH ULTRA TEST test strip  TEST BLOOD SUGAR UP TO 4 TIMES DAILY. 10/10/16   Roma Kayser, MD  ONETOUCH DELICA LANCETS 33G MISC USE AS DIRECTED TO TEST BLOOD SUGAR 4 TIMES DAILY. 10/10/16   Roma Kayser, MD                                                                                                                                    Past Surgical History Past Surgical History:  Procedure Laterality Date   BACK SURGERY     BIOPSY  01/31/2022   Procedure: BIOPSY;  Surgeon: Lynann Bologna, DO;  Location: [facility];  Service: Gastroenterology;;   CATARACT EXTRACTION Bilateral    COLONOSCOPY     COLONOSCOPY WITH PROPOFOL N/A 01/31/2022   Procedure: COLONOSCOPY WITH PROPOFOL;  Surgeon: Lynann Bologna, DO;  Location: [facility];  Service: Gastroenterology;  Laterality: N/A;   CYST REMOVAL TRUNK     from back   ESOPHAGOGASTRODUODENOSCOPY (EGD) WITH PROPOFOL N/A 01/31/2022   Procedure: ESOPHAGOGASTRODUODENOSCOPY (EGD) WITH PROPOFOL;  Surgeon: Lynann Bologna, DO;  Location: [facility];  Service: Gastroenterology;  Laterality: N/A;   EYE SURGERY Left 2016   laser to left eye   HIP SURGERY     bone removed from both sides of hip   KNEE ARTHROTOMY Right 12/04/2014   Procedure: KNEE ARTHROTOMY PATELLA LIGAMENT RECONSTRUSION AND REPAIR RIGHT KNEE;  Surgeon: Durene Romans, MD;  Location: [facility] OR;  Service: Orthopedics;  Laterality: Right;   KNEE SURGERY     X 25 TIMES   LEFT HEART CATH AND CORONARY ANGIOGRAPHY N/A 01/17/2018   Procedure: LEFT HEART CATH AND CORONARY ANGIOGRAPHY;  Surgeon: Lyn Records, MD;  Location: [facility] INVASIVE CV [facility];  Service: Cardiovascular;  Laterality: N/A;   LUMBAR DISC SURGERY     Left L3, L4, L5 discecotomy with decompression of L4 root   POLYPECTOMY  01/31/2022   Procedure: POLYPECTOMY;  Surgeon: Lynann Bologna, DO;  Location: [facility];  Service: Gastroenterology;;   TONSILLECTOMY     TOTAL KNEE ARTHROPLASTY  2003   LEFT   TOTAL KNEE ARTHROPLASTY Right  03/23/2014   Procedure: RIGHT TOTAL KNEE ARTHROPLASTY AND REMOVAL RIGHT TIBIAL  DEEP IMPLANT STAPLE;  Surgeon: Durene Romans, MD;  Location: [facility];  Service: Orthopedics;  Laterality: Right;   TOTAL KNEE REVISION  2005   LEFT   WRIST SURGERY     Family History Family History  Problem Relation Age of Onset   Hypertension Father    Heart attack Father    Kidney Stones Father    Seizures Grandchild    Narcolepsy Grandchild    Diabetes Sister     Social History Social History   Tobacco Use   Smoking status: Former    Packs/day: 1.50    Years: 10.00    Additional pack years: 0.00    Total pack years: 15.00    Types: Cigarettes    Start date: 06/19/1967    Quit date: 01/03/1995    Years since quitting: 27.5    Passive exposure: Never   Smokeless tobacco: Never  Vaping Use   Vaping Use: Never used  Substance Use Topics   Alcohol use: No    Alcohol/week: 0.0 standard drinks of alcohol    Comment: quit drinking in 07/86   Drug use: No   Allergies Tape, Farxiga [dapagliflozin], Cardizem [diltiazem hcl], Cardura [doxazosin mesylate], Crestor [rosuvastatin], Lipitor [atorvastatin], Paxil [paroxetine hcl], Codeine, and Gabapentin  Review of Systems Review of Systems  Respiratory:  Positive for shortness of breath.   Cardiovascular:  Positive for chest pain.  Gastrointestinal:  Positive for abdominal pain.    Physical Exam Vital Signs  I have reviewed the triage vital signs BP 115/65 (BP Location: Left Arm)   Pulse 65   Temp 98 F (36.7 C) (Oral)   Resp 18   Ht 5\' 4"  (1.626 m)   Wt 103.1 kg   SpO2 95%   BMI 39.01 kg/m   Physical Exam Constitutional:      General: He is not in acute distress.    Appearance: Normal appearance.  HENT:     Head: Normocephalic and atraumatic.     Nose: No congestion or rhinorrhea.  Eyes:     General:        Right eye: No discharge.        Left eye: No discharge.     Extraocular Movements: Extraocular movements intact.     Pupils:  Pupils are equal, round, and reactive to light.  Cardiovascular:     Rate and Rhythm: Normal rate and regular rhythm.     Heart sounds: No murmur heard. Pulmonary:     Effort: No respiratory distress.     Breath sounds: No wheezing or rales.  Abdominal:     General: There is no distension.     Tenderness: There is abdominal tenderness.  Musculoskeletal:        General: Normal range of motion.     Cervical back: Normal range of motion.  Skin:    General: Skin is warm and dry.  Neurological:     General: No focal deficit present.     Mental Status: He is alert.     ED  Results and Treatments Labs (all labs ordered are listed, but only abnormal results are displayed) Labs Reviewed  COMPREHENSIVE METABOLIC PANEL - Abnormal; Notable for the following components:      Result Value   Sodium 134 (*)    Glucose, Bld 369 (*)    BUN 30 (*)    Creatinine, Ser 1.33 (*)    Calcium 8.7 (*)    AST 14 (*)    GFR, Estimated 57 (*)    All other components within normal limits  CBC WITH DIFFERENTIAL/PLATELET - Abnormal; Notable for the following components:   RBC 3.33 (*)    Hemoglobin 9.8 (*)    HCT 30.5 (*)    Monocytes Absolute 1.2 (*)    Abs Immature Granulocytes 0.08 (*)    All other components within normal limits  LIPASE, BLOOD - Abnormal; Notable for the following components:   Lipase 53 (*)    All other components within normal limits  URINALYSIS, ROUTINE W REFLEX MICROSCOPIC - Abnormal; Notable for the following components:   Color, Urine STRAW (*)    Glucose, UA 150 (*)    All other components within normal limits  CBG MONITORING, ED - Abnormal; Notable for the following components:   Glucose-Capillary 158 (*)    All other components within normal limits  BRAIN NATRIURETIC PEPTIDE  MAGNESIUM  PHOSPHORUS  TSH  HEMOGLOBIN A1C  BASIC METABOLIC PANEL  TROPONIN I (HIGH SENSITIVITY)  TROPONIN I (HIGH SENSITIVITY)                                                                                                                           Radiology CT Angio Chest PE W and/or Wo Contrast  Result Date: 07/11/2022 CLINICAL DATA:  Pulmonary embolism (PE) suspected, high prob; Epigastric pain EXAM: CT ANGIOGRAPHY CHEST CT ABDOMEN AND PELVIS WITH CONTRAST TECHNIQUE: Multidetector CT imaging of the chest was performed using the standard protocol during bolus administration of intravenous contrast. Multiplanar CT image reconstructions and MIPs were obtained to evaluate the vascular anatomy. Multidetector CT imaging of the abdomen and pelvis was performed using the standard protocol during bolus administration of intravenous contrast. RADIATION DOSE REDUCTION: This exam was performed according to the departmental dose-optimization program which includes automated exposure control, adjustment of the mA and/or kV according to patient size and/or use of iterative reconstruction technique. CONTRAST:  OMNIPAQUE IOHEXOL 350 MG/ML SOLN COMPARISON:  07/08/2022 CT, 03/11/2021 CT FINDINGS: CTA CHEST FINDINGS Cardiovascular: Satisfactory opacification of the pulmonary arteries to the segmental level. No evidence of pulmonary embolism. Thoracic aorta is nonaneurysmal. Atherosclerotic calcifications of the aorta and coronary arteries. Upper normal heart size. No pericardial effusion. Mediastinum/Nodes: No enlarged mediastinal, hilar, or axillary lymph nodes. Thyroid gland, trachea, and esophagus demonstrate no significant findings. Lungs/Pleura: Trace left pleural effusion. Mild ground-glass attenuation with interlobular septal thickening, more pronounced within the perihilar regions and upper lobes bilaterally. No pneumothorax. Musculoskeletal: No chest wall abnormality. No acute or significant osseous findings. Anterior  endplate spurring within the mid to lower thoracic spine. Review of the MIP images confirms the above findings. CT ABDOMEN and PELVIS FINDINGS Hepatobiliary: No focal liver abnormality  is seen. No gallstones, gallbladder wall thickening, or biliary dilatation. Pancreas: Unremarkable. No pancreatic ductal dilatation or surrounding inflammatory changes. Spleen: Normal in size without focal abnormality. Adrenals/Urinary Tract: Unremarkable adrenal glands. Kidneys enhance symmetrically without focal lesion, stone, or hydronephrosis. Ureters are nondilated. Urinary bladder appears unremarkable for the degree of distention. Stomach/Bowel: Stomach is within normal limits. Appendix appears normal (series 2, image 34). No evidence of bowel wall thickening, distention, or inflammatory changes. Vascular/Lymphatic: Aortic atherosclerosis. No enlarged abdominal or pelvic lymph nodes. Reproductive: Prostatomegaly. Other: No free fluid. No abdominopelvic fluid collection. No pneumoperitoneum. No abdominal wall hernia. Musculoskeletal: No acute or significant osseous findings. Unchanged soft tissue thickening along the lower anterior abdominal wall, likely reflecting injection-related changes. Review of the MIP images confirms the above findings. IMPRESSION: 1. No evidence of pulmonary embolism. 2. Mild ground-glass attenuation with interlobular septal thickening, more pronounced within the perihilar regions and upper lobes bilaterally. Findings are favored to represent mild pulmonary edema. 3. Trace left pleural effusion. 4. No acute abdominopelvic findings. 5. Prostatomegaly. 6. Aortic and coronary artery atherosclerosis (ICD10-I70.0). Electronically Signed   By: Duanne Guess D.O.   On: 07/11/2022 12:31   CT ABDOMEN PELVIS W CONTRAST  Result Date: 07/11/2022 CLINICAL DATA:  Pulmonary embolism (PE) suspected, high prob; Epigastric pain EXAM: CT ANGIOGRAPHY CHEST CT ABDOMEN AND PELVIS WITH CONTRAST TECHNIQUE: Multidetector CT imaging of the chest was performed using the standard protocol during bolus administration of intravenous contrast. Multiplanar CT image reconstructions and MIPs were obtained to  evaluate the vascular anatomy. Multidetector CT imaging of the abdomen and pelvis was performed using the standard protocol during bolus administration of intravenous contrast. RADIATION DOSE REDUCTION: This exam was performed according to the departmental dose-optimization program which includes automated exposure control, adjustment of the mA and/or kV according to patient size and/or use of iterative reconstruction technique. CONTRAST:  OMNIPAQUE IOHEXOL 350 MG/ML SOLN COMPARISON:  07/08/2022 CT, 03/11/2021 CT FINDINGS: CTA CHEST FINDINGS Cardiovascular: Satisfactory opacification of the pulmonary arteries to the segmental level. No evidence of pulmonary embolism. Thoracic aorta is nonaneurysmal. Atherosclerotic calcifications of the aorta and coronary arteries. Upper normal heart size. No pericardial effusion. Mediastinum/Nodes: No enlarged mediastinal, hilar, or axillary lymph nodes. Thyroid gland, trachea, and esophagus demonstrate no significant findings. Lungs/Pleura: Trace left pleural effusion. Mild ground-glass attenuation with interlobular septal thickening, more pronounced within the perihilar regions and upper lobes bilaterally. No pneumothorax. Musculoskeletal: No chest wall abnormality. No acute or significant osseous findings. Anterior endplate spurring within the mid to lower thoracic spine. Review of the MIP images confirms the above findings. CT ABDOMEN and PELVIS FINDINGS Hepatobiliary: No focal liver abnormality is seen. No gallstones, gallbladder wall thickening, or biliary dilatation. Pancreas: Unremarkable. No pancreatic ductal dilatation or surrounding inflammatory changes. Spleen: Normal in size without focal abnormality. Adrenals/Urinary Tract: Unremarkable adrenal glands. Kidneys enhance symmetrically without focal lesion, stone, or hydronephrosis. Ureters are nondilated. Urinary bladder appears unremarkable for the degree of distention. Stomach/Bowel: Stomach is within normal  limits. Appendix appears normal (series 2, image 34). No evidence of bowel wall thickening, distention, or inflammatory changes. Vascular/Lymphatic: Aortic atherosclerosis. No enlarged abdominal or pelvic lymph nodes. Reproductive: Prostatomegaly. Other: No free fluid. No abdominopelvic fluid collection. No pneumoperitoneum. No abdominal wall hernia. Musculoskeletal: No acute or significant osseous findings. Unchanged soft tissue thickening along the lower anterior abdominal  wall, likely reflecting injection-related changes. Review of the MIP images confirms the above findings. IMPRESSION: 1. No evidence of pulmonary embolism. 2. Mild ground-glass attenuation with interlobular septal thickening, more pronounced within the perihilar regions and upper lobes bilaterally. Findings are favored to represent mild pulmonary edema. 3. Trace left pleural effusion. 4. No acute abdominopelvic findings. 5. Prostatomegaly. 6. Aortic and coronary artery atherosclerosis (ICD10-I70.0). Electronically Signed   By: Duanne Guess D.O.   On: 07/11/2022 12:31   DG Chest Portable 1 View  Result Date: 07/11/2022 CLINICAL DATA:  dyspnea EXAM: PORTABLE CHEST 1 VIEW COMPARISON:  CXR 06/06/22 FINDINGS: No pleural effusion. No pneumothorax. Unchanged cardiac and mediastinal contours. Low lung volumes. Hazy bibasilar airspace opacities are favored to represent atelectasis. No radiographically apparent displaced rib fractures. Visualized upper abdomen is unremarkable. IMPRESSION: Low lung volumes with bibasilar atelectasis Electronically Signed   By: Lorenza Cambridge M.D.   On: 07/11/2022 11:28    Pertinent labs & imaging results that were available during my care of the patient were reviewed by me and considered in my medical decision making (see MDM for details).  Medications Ordered in ED Medications  furosemide (LASIX) injection 80 mg (80 mg Intravenous Given 07/11/22 2111)  metolazone (ZAROXOLYN) tablet 2.5 mg (2.5 mg Oral Given 07/11/22  1636)  sodium chloride flush (NS) 0.9 % injection 3 mL (3 mLs Intravenous Given 07/11/22 2114)  sodium chloride flush (NS) 0.9 % injection 3 mL (has no administration in time range)  0.9 %  sodium chloride infusion (has no administration in time range)  acetaminophen (TYLENOL) tablet 650 mg (has no administration in time range)    Or  acetaminophen (TYLENOL) suppository 650 mg (has no administration in time range)  ondansetron (ZOFRAN) tablet 4 mg (has no administration in time range)    Or  ondansetron (ZOFRAN) injection 4 mg (has no administration in time range)  HYDROmorphone (DILAUDID) injection 1 mg (1 mg Intravenous Given 07/11/22 1641)  insulin aspart (novoLOG) injection 0-15 Units (3 Units Subcutaneous Given 07/11/22 1826)  insulin aspart (novoLOG) injection 0-5 Units (has no administration in time range)  apixaban (ELIQUIS) tablet 5 mg (5 mg Oral Given 07/11/22 2112)  alfuzosin (UROXATRAL) 24 hr tablet 10 mg (has no administration in time range)  ferrous sulfate tablet 325 mg (has no administration in time range)  fluticasone furoate-vilanterol (BREO ELLIPTA) 100-25 MCG/ACT 1 puff (1 puff Inhalation Not Given 07/11/22 1451)    And  umeclidinium bromide (INCRUSE ELLIPTA) 62.5 MCG/ACT 1 puff (1 puff Inhalation Not Given 07/11/22 1451)  isosorbide mononitrate (IMDUR) 24 hr tablet 30 mg (has no administration in time range)  levothyroxine (SYNTHROID) tablet 137 mcg (has no administration in time range)  loratadine (CLARITIN) tablet 10 mg (has no administration in time range)  modafinil (PROVIGIL) tablet 200 mg (has no administration in time range)  rOPINIRole (REQUIP) tablet 2 mg (2 mg Oral Given 07/11/22 2114)  rOPINIRole (REQUIP XL) 24 hr tablet 4 mg (has no administration in time range)  pravastatin (PRAVACHOL) tablet 40 mg (40 mg Oral Given 07/11/22 1827)  metoprolol succinate (TOPROL-XL) 24 hr tablet 25 mg (25 mg Oral Given 07/11/22 2112)  ipratropium-albuterol (DUONEB) 0.5-2.5 (3) MG/3ML  nebulizer solution 3 mL (has no administration in time range)  predniSONE (DELTASONE) tablet 40 mg (has no administration in time range)  azithromycin (ZITHROMAX) tablet 500 mg (500 mg Oral Given 07/11/22 1826)  insulin glargine-yfgn (SEMGLEE) injection 35 Units (has no administration in time range)  fentaNYL (SUBLIMAZE) injection 50 mcg (  50 mcg Intravenous Given 07/11/22 1038)  iohexol (OMNIPAQUE) 350 MG/ML injection 100 mL (100 mLs Intravenous Contrast Given 07/11/22 1123)  morphine (PF) 4 MG/ML injection 4 mg (4 mg Intravenous Given 07/11/22 1212)  ondansetron (ZOFRAN) injection 4 mg (4 mg Intravenous Given 07/11/22 1212)  furosemide (LASIX) injection 80 mg (80 mg Intravenous Given 07/11/22 1319)                                                                                                                                     Procedures Procedures  (including critical care time)  Medical Decision Making / ED Course   This patient presents to the ED for concern of orthopnea, chest pain, abdominal pain, this involves an extensive number of treatment options, and is a complaint that carries with it a high risk of complications and morbidity.  The differential diagnosis includes CHF exacerbation, PE, pancreatitis, gastritis, muscle strain, pneumonia, referred pain, obstruction  MDM: Patient seen emergency room for evaluation of shortness of breath, orthopnea chest and abdominal pain.  His exam with left upper quadrant abdominal tenderness to palpation and tenderness along left chest wall with lower extremity pitting edema.  Laboratory evaluation with the glucose of 369, BUN 30, creatinine 1.33 but is otherwise unremarkable.  Chest x-ray with bibasilar atelectasis but is otherwise unremarkable.  Given his history of previous PE and current shortness of breath with pleurisy, PE study was obtained as well as a CT abdomen pelvis given his significant left upper quadrant tenderness to palpation.  The scans were  reassuringly negative showing possible evidence of very mild pulmonary edema.  His BNP is normal but can be falsely low in the setting of obesity.  Given his orthopnea, 10 pound weight gain, suspect patient is having pulmonary edema causing persistent coughing and a possible chest wall muscle strain causing his pain.  He has failed his outpatient oral Lasix and IV Lasix was initiated.  Patient require hospital admission for failure of outpatient diuresis.  Patient then admitted.   Additional history obtained: -Additional history obtained from wife -External records from outside source obtained and reviewed including: Chart review including previous notes, labs, imaging, consultation notes   Lab Tests: -I ordered, reviewed, and interpreted labs.   The pertinent results include:   Labs Reviewed  COMPREHENSIVE METABOLIC PANEL - Abnormal; Notable for the following components:      Result Value   Sodium 134 (*)    Glucose, Bld 369 (*)    BUN 30 (*)    Creatinine, Ser 1.33 (*)    Calcium 8.7 (*)    AST 14 (*)    GFR, Estimated 57 (*)    All other components within normal limits  CBC WITH DIFFERENTIAL/PLATELET - Abnormal; Notable for the following components:   RBC 3.33 (*)    Hemoglobin 9.8 (*)    HCT 30.5 (*)    Monocytes Absolute 1.2 (*)  Abs Immature Granulocytes 0.08 (*)    All other components within normal limits  LIPASE, BLOOD - Abnormal; Notable for the following components:   Lipase 53 (*)    All other components within normal limits  URINALYSIS, ROUTINE W REFLEX MICROSCOPIC - Abnormal; Notable for the following components:   Color, Urine STRAW (*)    Glucose, UA 150 (*)    All other components within normal limits  CBG MONITORING, ED - Abnormal; Notable for the following components:   Glucose-Capillary 158 (*)    All other components within normal limits  BRAIN NATRIURETIC PEPTIDE  MAGNESIUM  PHOSPHORUS  TSH  HEMOGLOBIN A1C  BASIC METABOLIC PANEL  TROPONIN I (HIGH  SENSITIVITY)  TROPONIN I (HIGH SENSITIVITY)      EKG   EKG Interpretation Date/Time:  Tuesday July 11 2022 10:16:42 EDT Ventricular Rate:  79 PR Interval:  164 QRS Duration:  101 QT Interval:  382 QTC Calculation: 438 R Axis:   -30  Text Interpretation: Sinus rhythm Inferior infarct, old Confirmed by Martha Soltys ([id number]) on 07/11/2022 9:17:02 PM         Imaging Studies ordered: I ordered imaging studies including chest x-ray, CT PE, CTAP I independently visualized and interpreted imaging. I agree with the radiologist interpretation   Medicines ordered and prescription drug management: Meds ordered this encounter  Medications   fentaNYL (SUBLIMAZE) injection 50 mcg   iohexol (OMNIPAQUE) 350 MG/ML injection 100 mL   morphine (PF) 4 MG/ML injection 4 mg   ondansetron (ZOFRAN) injection 4 mg   DISCONTD: alum & mag hydroxide-simeth (MAALOX/MYLANTA) 200-200-20 MG/5ML suspension 30 mL   DISCONTD: lidocaine (XYLOCAINE) 2 % viscous mouth solution 15 mL   furosemide (LASIX) injection 80 mg   furosemide (LASIX) injection 80 mg   metolazone (ZAROXOLYN) tablet 2.5 mg   sodium chloride flush (NS) 0.9 % injection 3 mL   sodium chloride flush (NS) 0.9 % injection 3 mL   0.9 %  sodium chloride infusion   OR Linked Order Group    acetaminophen (TYLENOL) tablet 650 mg    acetaminophen (TYLENOL) suppository 650 mg   OR Linked Order Group    ondansetron (ZOFRAN) tablet 4 mg    ondansetron (ZOFRAN) injection 4 mg   HYDROmorphone (DILAUDID) injection 1 mg   insulin aspart (novoLOG) injection 0-15 Units    Order Specific Question:   Correction coverage:    Answer:   Moderate (average weight, post-op)    Order Specific Question:   CBG < 70:    Answer:   Implement Hypoglycemia Standing Orders and refer to Hypoglycemia Standing Orders sidebar report    Order Specific Question:   CBG 70 - 120:    Answer:   0 units    Order Specific Question:   CBG 121 - 150:    Answer:   2 units     Order Specific Question:   CBG 151 - 200:    Answer:   3 units    Order Specific Question:   CBG 201 - 250:    Answer:   5 units    Order Specific Question:   CBG 251 - 300:    Answer:   8 units    Order Specific Question:   CBG 301 - 350:    Answer:   11 units    Order Specific Question:   CBG 351 - 400:    Answer:   15 units    Order Specific Question:   CBG > 400  Answer:   call MD and obtain STAT lab verification   insulin aspart (novoLOG) injection 0-5 Units    Order Specific Question:   Correction coverage:    Answer:   HS scale    Order Specific Question:   CBG < 70:    Answer:   Implement Hypoglycemia Standing Orders and refer to Hypoglycemia Standing Orders sidebar report    Order Specific Question:   CBG 70 - 120:    Answer:   0 units    Order Specific Question:   CBG 121 - 150:    Answer:   0 units    Order Specific Question:   CBG 151 - 200:    Answer:   0 units    Order Specific Question:   CBG 201 - 250:    Answer:   2 units    Order Specific Question:   CBG 251 - 300:    Answer:   3 units    Order Specific Question:   CBG 301 - 350:    Answer:   4 units    Order Specific Question:   CBG 351 - 400:    Answer:   5 units    Order Specific Question:   CBG > 400    Answer:   call MD and obtain STAT lab verification   DISCONTD: insulin glargine-yfgn (SEMGLEE) injection 18 Units   apixaban (ELIQUIS) tablet 5 mg    For stroke prevention     alfuzosin (UROXATRAL) 24 hr tablet 10 mg   DISCONTD: albuterol (PROVENTIL) (2.5 MG/3ML) 0.083% nebulizer solution 2.5 mg   DISCONTD: ezetimibe (ZETIA) tablet 10 mg   ferrous sulfate tablet 325 mg   AND Linked Order Group    fluticasone furoate-vilanterol (BREO ELLIPTA) 100-25 MCG/ACT 1 puff    umeclidinium bromide (INCRUSE ELLIPTA) 62.5 MCG/ACT 1 puff   isosorbide mononitrate (IMDUR) 24 hr tablet 30 mg   levothyroxine (SYNTHROID) tablet 137 mcg   loratadine (CLARITIN) tablet 10 mg   modafinil (PROVIGIL) tablet 200 mg    rOPINIRole (REQUIP) tablet 2 mg   rOPINIRole (REQUIP XL) 24 hr tablet 4 mg   pravastatin (PRAVACHOL) tablet 40 mg   metoprolol succinate (TOPROL-XL) 24 hr tablet 25 mg   ipratropium-albuterol (DUONEB) 0.5-2.5 (3) MG/3ML nebulizer solution 3 mL   predniSONE (DELTASONE) tablet 40 mg   azithromycin (ZITHROMAX) tablet 500 mg   insulin glargine-yfgn (SEMGLEE) injection 35 Units    -I have reviewed the patients home medicines and have made adjustments as needed  Critical interventions none   Cardiac Monitoring: The patient was maintained on a cardiac monitor.  I personally viewed and interpreted the cardiac monitored which showed an underlying rhythm of: NSR  Social Determinants of Health:  Factors impacting patients care include: none   Reevaluation: After the interventions noted above, I reevaluated the patient and found that they have :improved  Co morbidities that complicate the patient evaluation  Past Medical History:  Diagnosis Date   Arthritis    Asthma    Atrial fibrillation [facility])    Diagnosed December 2021   Colon polyp    Coronary atherosclerosis of native coronary artery    a. 2011: cath showing 90% stenosis along small non-dominant RCA (too small for PCI). b. 01/2018: cath showing nonobstructive CAD with 60 to 70% proximal to mid nondominant RCA stenosis, 50% mid LAD and 40 to 50% OM1   DVT (deep venous thrombosis) (HCC) 2005   Right arm   Essential hypertension  Headache    History of transfusion    Hypothyroidism    Mixed hyperlipidemia    Morbid obesity (HCC)    MRSA (methicillin resistant staph aureus) culture positive    08/2012   Narcolepsy    OSA (obstructive sleep apnea)    CPAP   Osteoarthritis    Pneumonia    Psoriasis    Pulmonary embolism (HCC) 2004   RLS (restless legs syndrome)    Rotator cuff disorder    Left   Septic arthritis of knee, left (HCC)    Skin cancer, basal cell    Type 2 diabetes mellitus (HCC)       Dispostion: I  considered admission for this patient, and due to failure of outpatient diuresis patient require hospital admission     Final Clinical Impression(s) / ED Diagnoses Final diagnoses:  Acute on chronic congestive heart failure, unspecified heart failure type [facility])     @PCDICTATION @    Glendora Score, MD 07/11/22 2117

## 2022-07-11 NOTE — Assessment & Plan Note (Signed)
-  Continue the use of metoprolol and Imdur -Holding Cozaar in order to safe room for diuresis -Follow vital signs and adjust antihypertensive treatment as required -Patient instructed to follow heart healthy/low-sodium diet.

## 2022-07-11 NOTE — Assessment & Plan Note (Signed)
-  Body mass index is 38.41 kg/m. -Low-calorie diet, portion control and increase physical activity discussed with patient

## 2022-07-11 NOTE — H&P (Signed)
History and Physical    Patient: Paul Baker:[id number] DOB: Jul 29, 1950 DOA: 07/11/2022 DOS: the patient was seen and examined on 07/11/2022 PCP: [name], MD  Patient coming from: Home  Chief Complaint:  Chief Complaint  Patient presents with   Shortness of Breath   HPI: Paul Baker is a 72 y.o. male with medical history significant of obstructive sleep apnea on CPAP, interstitial lung disease, restless leg syndrome, uncontrolled type 2 diabetes with nephropathy (stage IIIa), hypertension, hypothyroidism, hyperlipidemia, class II obesity, diastolic heart failure and paroxysmal atrial fibrillation; who presented to the hospital secondary to shortness of breath, orthopnea and increased weight gain (over 10 pounds in the last week or so).  Patient reports symptoms has been gradually worsening and of the day of admission he was experiencing pleuritic chest discomfort for when coughing especially if laying down. No fever, no nausea, no vomiting, no dysuria or hematuria, no melena, no hematochezia, no focal weaknesses or sick contacts. Patient reports no hemoptysis, but noticing intermittent productive coughing spells with yellow sputum.  EKG without acute ischemic changes; troponin negative.  Chest x-ray with bronchitis changes no other frank abnormalities or infiltrates.  TR[facility] has been consulted to place patient in the hospital for further evaluation and management of what appears to be acute exacerbation interstitial lung disease and diastolic heart failure.  Review of Systems: As mentioned in the history of present illness. All other systems reviewed and are negative. Past Medical History:  Diagnosis Date   Arthritis    Asthma    Atrial fibrillation [facility])    Diagnosed December 2021   Colon polyp    Coronary atherosclerosis of native coronary artery    a. 2011: cath showing 90% stenosis along small non-dominant RCA (too small for PCI). b. 01/2018: cath showing  nonobstructive CAD with 60 to 70% proximal to mid nondominant RCA stenosis, 50% mid LAD and 40 to 50% OM1   DVT (deep venous thrombosis) (HCC) 2005   Right arm   Essential hypertension    Headache    History of transfusion    Hypothyroidism    Mixed hyperlipidemia    Morbid obesity (HCC)    MRSA (methicillin resistant staph aureus) culture positive    08/2012   Narcolepsy    OSA (obstructive sleep apnea)    CPAP   Osteoarthritis    Pneumonia    Psoriasis    Pulmonary embolism (HCC) 2004   RLS (restless legs syndrome)    Rotator cuff disorder    Left   Septic arthritis of knee, left (HCC)    Skin cancer, basal cell    Type 2 diabetes mellitus (HCC)    Past Surgical History:  Procedure Laterality Date   BACK SURGERY     BIOPSY  01/31/2022   Procedure: BIOPSY;  Surgeon: Lynann Bologna, DO;  Location: [facility];  Service: Gastroenterology;;   CATARACT EXTRACTION Bilateral    COLONOSCOPY     COLONOSCOPY WITH PROPOFOL N/A 01/31/2022   Procedure: COLONOSCOPY WITH PROPOFOL;  Surgeon: Lynann Bologna, DO;  Location: [facility];  Service: Gastroenterology;  Laterality: N/A;   CYST REMOVAL TRUNK     from back   ESOPHAGOGASTRODUODENOSCOPY (EGD) WITH PROPOFOL N/A 01/31/2022   Procedure: ESOPHAGOGASTRODUODENOSCOPY (EGD) WITH PROPOFOL;  Surgeon: Lynann Bologna, DO;  Location: [facility];  Service: Gastroenterology;  Laterality: N/A;   EYE SURGERY Left 2016   laser to left eye   HIP SURGERY     bone removed from both  sides of hip   KNEE ARTHROTOMY Right 12/04/2014   Procedure: KNEE ARTHROTOMY PATELLA LIGAMENT RECONSTRUSION AND REPAIR RIGHT KNEE;  Surgeon: Durene Romans, MD;  Location: [facility] OR;  Service: Orthopedics;  Laterality: Right;   KNEE SURGERY     X 25 TIMES   LEFT HEART CATH AND CORONARY ANGIOGRAPHY N/A 01/17/2018   Procedure: LEFT HEART CATH AND CORONARY ANGIOGRAPHY;  Surgeon: Lyn Records, MD;  Location: [facility];  Service: Cardiovascular;  Laterality:  N/A;   LUMBAR DISC SURGERY     Left L3, L4, L5 discecotomy with decompression of L4 root   POLYPECTOMY  01/31/2022   Procedure: POLYPECTOMY;  Surgeon: Lynann Bologna, DO;  Location: [facility];  Service: Gastroenterology;;   TONSILLECTOMY     TOTAL KNEE ARTHROPLASTY  2003   LEFT   TOTAL KNEE ARTHROPLASTY Right 03/23/2014   Procedure: RIGHT TOTAL KNEE ARTHROPLASTY AND REMOVAL RIGHT TIBIAL  DEEP IMPLANT STAPLE;  Surgeon: Durene Romans, MD;  Location: [facility] ORS;  Service: Orthopedics;  Laterality: Right;   TOTAL KNEE REVISION  2005   LEFT   WRIST SURGERY     Social History:  reports that he quit smoking about 27 years ago. His smoking use included cigarettes. He started smoking about 55 years ago. He has a 15.00 pack-year smoking history. He has never been exposed to tobacco smoke. He has never used smokeless tobacco. He reports that he does not drink alcohol and does not use drugs.  Allergies  Allergen Reactions   Tape Rash    Pulls off skin   Farxiga [Dapagliflozin]     Yeast infections, felt sick with it   Cardizem [Diltiazem Hcl]     Edema    Cardura [Doxazosin Mesylate]     Headaches / cramps   Crestor [Rosuvastatin] Other (See Comments)    Leg cramps   Lipitor [Atorvastatin] Other (See Comments)    Leg cramps   Paxil [Paroxetine Hcl]     Unknown reaction    Codeine Rash and Other (See Comments)    Headache    Gabapentin Rash    Family History  Problem Relation Age of Onset   Hypertension Father    Heart attack Father    Kidney Stones Father    Seizures Grandchild    Narcolepsy Grandchild    Diabetes Sister     Prior to Admission medications   Medication Sig Start Date End Date Taking? Authorizing Provider  acetaminophen (TYLENOL) 325 MG tablet Take 2 tablets (650 mg total) by mouth every 6 (six) hours as needed for mild pain (or Fever >/= 101). Patient taking differently: Take 1,000 mg by mouth every 6 (six) hours as needed for mild pain (or Fever >/= 101).  12/05/19  Yes Emokpae, Courage, MD  albuterol (PROVENTIL) (2.5 MG/3ML) 0.083% nebulizer solution Take 3 mLs (2.5 mg total) by nebulization every 6 (six) hours as needed for up to 7 days for wheezing or shortness of breath. 12/18/20  Yes Carroll Sage, PA-C  albuterol (VENTOLIN HFA) 108 (90 Base) MCG/ACT inhaler Inhale 2 puffs into the lungs every 6 (six) hours as needed for wheezing or shortness of breath. 06/03/20  Yes Parrett, Tammy S, NP  alfuzosin (UROXATRAL) 10 MG 24 hr tablet Take 10 mg by mouth daily. 03/02/21  Yes [provider]  Alpha Lipoic Acid-Biotin ER 600-450 MG-MCG TBCR Take 600 mg by mouth every morning. Patient taking differently: Take 600 mg by mouth at bedtime. 05/04/22  Yes Dohmeier, Porfirio Mylar, MD  apixaban (ELIQUIS) 5 MG TABS tablet Take 1 tablet (5 mg total) by mouth 2 (two) times daily. For stroke prevention 04/07/22  Yes Sharlene Dory, NP  clobetasol cream (TEMOVATE) 0.05 % Apply thin amount 2 times daily prn to psoriasis not for face axilla or groin 05/26/22  Yes Luking, Jonna Coup, MD  ferrous sulfate 325 (65 FE) MG EC tablet Take 1 tablet (325 mg total) by mouth daily with breakfast. Patient taking differently: Take 325 mg by mouth at bedtime. 05/04/22  Yes Georga Kaufmann T, PA-C  FIASP FLEXTOUCH 100 UNIT/ML FlexTouch Pen Inject 30 Units into the skin in the morning, at noon, and at bedtime. 30-38 units per sliding scale 06/20/22  Yes [provider]  fluticasone (FLONASE) 50 MCG/ACT nasal spray Place 1 spray into both nostrils daily. 06/27/22  Yes Coralyn Helling, MD  Fluticasone-Umeclidin-Vilant (TRELEGY ELLIPTA) 100-62.5-25 MCG/ACT AEPB Inhale 1 puff into the lungs daily. 07/11/22  Yes Coralyn Helling, MD  furosemide (LASIX) 40 MG tablet TAKE 2 TABLETS BY MOUTH IN THE MORNING AND 1 TABLET IN THE EVENING 05/30/22  Yes Sharlene Dory, NP  Glucagon (GVOKE HYPOPEN 2-PACK) 1 MG/0.2ML SOAJ Inject 1 mg subcutaneously once as needed for low blood sugar. May repeat dose in 15 minutes  as needed using a new device. 01/28/21  Yes Luking, Scott A, MD  insulin glargine, 2 Unit Dial, (TOUJEO MAX SOLOSTAR) 300 UNIT/ML Solostar Pen Inject 120 Units into the skin at bedtime. 05/22/22  Yes Dani Gobble, NP  isosorbide mononitrate (IMDUR) 30 MG 24 hr tablet Take 1 tablet (30 mg total) by mouth daily. Patient taking differently: Take 15 mg by mouth daily. 15mg  AM and 15 PM 05/19/22 11/15/22 Yes Sharlene Dory, NP  levothyroxine (SYNTHROID) 137 MCG tablet TAKE 1 TABLET BY MOUTH ONCE DAILY BEFORE BREAKFAST 03/28/22  Yes Dani Gobble, NP  loratadine (CLARITIN) 10 MG tablet Take 10 mg by mouth daily as needed for allergies.    Yes [provider]  losartan (COZAAR) 50 MG tablet Take 50 mg by mouth daily. 08/09/15  Yes [provider]  metoprolol succinate (TOPROL XL) 25 MG 24 hr tablet Take 1 tablet (25 mg total) by mouth 2 (two) times daily. 05/19/22  Yes Sharlene Dory, NP  modafinil (PROVIGIL) 200 MG tablet Take 1 tablet by mouth once daily 05/15/22  Yes Dohmeier, Porfirio Mylar, MD  nitroGLYCERIN (NITROSTAT) 0.4 MG SL tablet DISSOLVE 1 TABLET UNDER TONGUE EVERY 5 MINUTES UP TO 15 MIN FOR CHESTPAIN. IF NO RELIEF CALL 911. 05/19/22  Yes Sharlene Dory, NP  pravastatin (PRAVACHOL) 40 MG tablet TAKE 1 TABLET BY MOUTH ONCE DAILY IN THE EVENING 12/27/21  Yes Jonelle Sidle, MD  rOPINIRole (REQUIP XL) 4 MG 24 hr tablet Take 1 tablet (4 mg total) by mouth at bedtime. 06/05/22  Yes Dohmeier, Porfirio Mylar, MD  rOPINIRole (REQUIP) 4 MG tablet Take 0.5 tablets (2 mg total) by mouth 2 (two) times daily. 06/05/22  Yes Dohmeier, Porfirio Mylar, MD  Semaglutide, 1 MG/DOSE, 4 MG/3ML SOPN Inject 1 mg as directed once a week. 03/07/21  Yes Dani Gobble, NP  Continuous Glucose Sensor (DEXCOM G7 SENSOR) MISC Change sensor every 10 days 05/22/22   Dani Gobble, NP  ezetimibe (ZETIA) 10 MG tablet Take 1 tablet by mouth once daily Patient not taking: Reported on 07/11/2022 04/17/22   [name], MD   Insulin Pen Needle (B-D ULTRAFINE III SHORT PEN) 31G X 8 MM MISC USE 1 PEN NEEDLE THREE TIMES DAILY 01/19/22  Reardon, Freddi Starr, NP  ONE TOUCH ULTRA TEST test strip TEST BLOOD SUGAR UP TO 4 TIMES DAILY. 10/10/16   Roma Kayser, MD  ONETOUCH DELICA LANCETS 33G MISC USE AS DIRECTED TO TEST BLOOD SUGAR 4 TIMES DAILY. 10/10/16   Roma Kayser, MD    Physical Exam: Vitals:   07/11/22 1530 07/11/22 1545 07/11/22 1653 07/11/22 1700  BP: 121/66 128/79 115/67 115/67  Pulse: 72 74 72 71  Resp:    15  Temp:      TempSrc:      SpO2: 94% 96% 90% 95%  Weight:      Height:       General exam: Alert, awake, oriented x 3; in no major distress. Respiratory system: Decreased air movement bilaterally; fine crackles at the bases.  Not using accessory muscle.  No requiring oxygen supplementation. Cardiovascular system: Rate controlled, no rubs, no gallops, unable to properly assess JVD with body habitus. Gastrointestinal system: Abdomen is obese: Nondistended, soft and nontender. No organomegaly or masses felt. Normal bowel sounds heard. Central nervous system: Alert and oriented. No focal neurological deficits. Extremities: No cyanosis or clubbing; trace to 1+ edema appreciated. Skin: No petechiae. Psychiatry: Judgement and insight appear normal. Mood & affect appropriate.   Data Reviewed: Comprehensive metabolic panel: Sodium 134, potassium 4.0, chloride 1013, glucose 369, BUN 30, creatinine 1.33 normal LFTs and GFR 57 CBC: WBCs 8.9, hemoglobin 9.8 and platelet count 249 K  lipase: 53 BNP: 81 Troponin: 5 >> 4   Assessment and Plan: * Acute on chronic diastolic HF (heart failure) (HCC) -Patient reporting shortness of breath, orthopnea and increased 10 pounds weight gain in the last week or so prior to admission. -BNP not elevated but could be misleading in the setting of obesity -Mild bronchitic changes appreciated on x-ray. -Will treat with IV Lasix and metolazone; close  monitoring of patient's volume status with daily weights/strict I's and O's and pursue low-sodium diet. -Probably will benefit of being discharged on Demadex (as he was already taking 40 mg twice a day of Lasix without improvement or control of his condition). -Recent echo demonstrating preserved ejection fraction and grade 1 diastolic dysfunction.  Interstitial pulmonary disease (HCC) -Given bronchitic changes, complains of productive coughing spells (yellow sputum), he might be experiencing mild exacerbation. -Resume home bronchodilator management; 5 days of prednisone 40 mg and a short course of Zithromax will be provided -Not requiring oxygen supplementation currently -Follow clinical response.  Hypothyroidism -update TSH -Continue Synthroid.  Paroxysmal atrial fibrillation (HCC) -Currently rate controlled -Continue telemetry monitoring -Continue treatment with the use of metoprolol and Eliquis for secondary prevention.  Class 2 obesity -Body mass index is 38.41 kg/m. -Low-calorie diet, portion control and increase physical activity discussed with patient  Restless legs syndrome with familial myoclonus -Continue treatment with the use of Requip as previously prescribed.  OSA on CPAP -Continue the use of CPAP nightly.  Essential hypertension, benign -Continue the use of metoprolol and Imdur -Holding Cozaar in order to safe room for diuresis -Follow vital signs and adjust antihypertensive treatment as required -Patient instructed to follow heart healthy/low-sodium diet.  Hyperglycemia due to type 2 diabetes mellitus (HCC) -Most recent A1c over 95 days ago 8.9 demonstrating poor control. -Update A1c level -Continue sliding scale insulin and the use of Semglee -Follow CBGs fluctuation and adjust hypoglycemic regimen as needed.  Chronic kidney disease stage III A -Creatinine appears to be stable at baseline -Continue to closely follow renal function trend and electrolytes  in the  setting of acute diuresis -Maintain adequate hydration    Advance Care Planning:   Code Status: Full Code   Consults: None  Family Communication: No family at bedside.  Severity of Illness: The appropriate patient status for this patient is OBSERVATION. Observation status is judged to be reasonable and necessary in order to provide the required intensity of service to ensure the patient's safety. The patient's presenting symptoms, physical exam findings, and initial radiographic and laboratory data in the context of their medical condition is felt to place them at decreased risk for further clinical deterioration. Furthermore, it is anticipated that the patient will be medically stable for discharge from the hospital within 2 midnights of admission.   Author: Vassie Loll, MD 07/11/2022 5:38 PM  For on call review [email or web address].

## 2022-07-11 NOTE — Assessment & Plan Note (Signed)
-  Currently rate controlled -Continue telemetry monitoring -Continue treatment with the use of metoprolol and Eliquis for secondary prevention.

## 2022-07-11 NOTE — ED Notes (Signed)
Pt states he is unable to urinate at this time.

## 2022-07-11 NOTE — Assessment & Plan Note (Signed)
-  Continue the use of CPAP nightly ?

## 2022-07-12 DIAGNOSIS — I5033 Acute on chronic diastolic (congestive) heart failure: Secondary | ICD-10-CM | POA: Diagnosis not present

## 2022-07-12 LAB — GLUCOSE, CAPILLARY
Glucose-Capillary: 53 mg/dL — ABNORMAL LOW (ref 70–99)
Glucose-Capillary: 76 mg/dL (ref 70–99)
Glucose-Capillary: 215 mg/dL — ABNORMAL HIGH (ref 70–99)
Glucose-Capillary: 466 mg/dL — ABNORMAL HIGH (ref 70–99)
Glucose-Capillary: 515 mg/dL (ref 70–99)
Glucose-Capillary: 496 mg/dL — ABNORMAL HIGH (ref 70–99)

## 2022-07-12 LAB — BASIC METABOLIC PANEL
Anion gap: 10 (ref 5–15)
BUN: 33 mg/dL — ABNORMAL HIGH (ref 8–23)
CO2: 26 mmol/L (ref 22–32)
Calcium: 9.1 mg/dL (ref 8.9–10.3)
Chloride: 101 mmol/L (ref 98–111)
Creatinine, Ser: 1.38 mg/dL — ABNORMAL HIGH (ref 0.61–1.24)
GFR, Estimated: 55 mL/min — ABNORMAL LOW (ref >60)
Glucose, Bld: 92 mg/dL (ref 70–99)
Potassium: 3.7 mmol/L (ref 3.5–5.1)
Sodium: 137 mmol/L (ref 135–145)

## 2022-07-12 MED ORDER — INSULIN ASPART 100 UNIT/ML IJ SOLN
4.0000 [IU] | Freq: Three times a day (TID) | INTRAMUSCULAR | Status: DC
  Administered 2022-07-13 – 2022-07-14 (×3): 4 [IU] via SUBCUTANEOUS

## 2022-07-12 MED ORDER — INSULIN ASPART 100 UNIT/ML IJ SOLN
0.0000 [IU] | Freq: Three times a day (TID) | INTRAMUSCULAR | Status: DC
  Administered 2022-07-13 (×2): 11 [IU] via SUBCUTANEOUS
  Administered 2022-07-14: 4 [IU] via SUBCUTANEOUS

## 2022-07-12 MED ORDER — INSULIN ASPART 100 UNIT/ML IJ SOLN
24.0000 [IU] | Freq: Once | INTRAMUSCULAR | Status: AC
  Administered 2022-07-12: 24 [IU] via SUBCUTANEOUS

## 2022-07-12 MED ORDER — INSULIN ASPART 100 UNIT/ML IJ SOLN
6.0000 [IU] | Freq: Once | INTRAMUSCULAR | Status: AC
  Administered 2022-07-12: 6 [IU] via SUBCUTANEOUS

## 2022-07-12 MED ORDER — INSULIN GLARGINE-YFGN 100 UNIT/ML ~~LOC~~ SOLN
32.0000 [IU] | Freq: Every day | SUBCUTANEOUS | Status: DC
  Administered 2022-07-12: 32 [IU] via SUBCUTANEOUS
  Filled 2022-07-12 (×2): qty 0.32

## 2022-07-12 MED ORDER — INSULIN ASPART 100 UNIT/ML IJ SOLN
0.0000 [IU] | Freq: Every day | INTRAMUSCULAR | Status: DC
  Administered 2022-07-12 – 2022-07-13 (×2): 5 [IU] via SUBCUTANEOUS

## 2022-07-12 NOTE — Care Management Obs Status (Signed)
MEDICARE OBSERVATION STATUS NOTIFICATION   Patient Details  Name: Paul Baker MRN: [id number] Date of Birth: [birth year]   Medicare Observation Status Notification Given:  Yes    Annice Needy, LCSW 07/12/2022, 4:15 PM

## 2022-07-12 NOTE — Progress Notes (Signed)
PROGRESS NOTE     Paul Baker, is a 72 y.o. male, DOB - [birth year], [id number]  Admit date - 07/11/2022   Admitting Physician Vassie Loll, MD  Outpatient Primary MD for the patient is Paul Sciara, MD  LOS - 0  Chief Complaint  Patient presents with   Shortness of Breath        Brief Narrative:  72 y.o. male with medical history significant of obstructive sleep apnea on CPAP, interstitial lung disease, restless leg syndrome, uncontrolled type 2 diabetes with nephropathy (stage IIIa), hypertension, hypothyroidism, hyperlipidemia, class II obesity, diastolic heart failure and paroxysmal atrial fibrillation admitted on 07/11/2022 with greater than 10 pound weight gain and significant dyspnea as well as dyspnea on exertion consistent with acute on chronic diastolic dysfunction CHF    -Assessment and Plan: *1)Acute on chronic diastolic HF (heart failure) (HCC) --Baseline weight usually 215  to 217 lb -Admitted with a weight of 230 pounds -BNP not elevated but could be misleading in the setting of obesity -Mild bronchitic changes appreciated on x-ray. -Voiding well with IV Lasix but per documentation -Probably will benefit of being discharged on Demadex (as he was already taking 40 mg twice a day of Lasix without improvement or control of his condition). -Recent echo demonstrating preserved ejection fraction and grade 1 diastolic dysfunction. REDs Vest requested- -dyspnea at rest, dyspnea on exertion and orthopnea persist  2)Interstitial pulmonary disease (HCC) -Given bronchitic changes, complains of productive coughing spells (yellow sputum), he might be experiencing mild exacerbation. -Resume home bronchodilator management; 5 days of prednisone 40 mg and a short course of Zithromax will be provided -No hypoxia -This could be contributing to dyspnea  Hypothyroidism -update TSH -Continue Synthroid.  Paroxysmal atrial fibrillation (HCC) -Stable, metoprolol for rate  control and Eliquis for stroke prophylaxis  Class 2 obesity -Low calorie diet, portion control and increase physical activity discussed with patient -Body mass index is 39.01 kg/m.   Restless legs syndrome with familial myoclonus -Continue treatment with the use of Requip as previously prescribed.  OSA on CPAP -Continue the use of CPAP nightly.  Essential hypertension, benign -Continue the use of metoprolol and Imdur -Holding Cozaar in order to safe room for diuresis -Follow vital signs and adjust antihypertensive treatment as required -Patient instructed to follow heart healthy/low-sodium diet.  Hyperglycemia due to type 2 diabetes mellitus (HCC) -A1c has been 8.9 each time is being checked over the last 8 months or so -Patient's insulin regimen was recently changed just about a month ago -Had episode of hypoglycemia this morning -Sugars may be more erratic given prednisone use needs admission -Continue sliding scale insulin and the use of Semglee- -diabetic educator input appreciated Semglee decreased to 32 units from 35 units Use Novolog/Humalog Sliding scale insulin with Accu-Cheks/Fingersticks as ordered  -CKD stage -3A    -Creatinine currently in the 1.3 range which is not far from baseline, -Monitor closely while diuresing - renally adjust medications, avoid nephrotoxic agents / dehydration  / hypotension   Disposition: The patient is from: Home              Anticipated d/c is to: Home              Anticipated d/c date is: 2 days              Patient currently is not medically stable to d/c. Barriers: Not Clinically Stable-   Code Status :  -  Code Status: Full Code   Family Communication:   (  patient is alert, awake and coherent)  Discussed with wife and daughter at bedside  DVT Prophylaxis  :   - SCDs    apixaban (ELIQUIS) tablet 5 mg   Lab Results  Component Value Date   PLT 249 07/11/2022    Inpatient Medications  Scheduled Meds:  alfuzosin  10 mg  Oral Daily   apixaban  5 mg Oral BID   azithromycin  500 mg Oral Daily   ferrous sulfate  325 mg Oral Q breakfast   fluticasone furoate-vilanterol  1 puff Inhalation Daily   And   umeclidinium bromide  1 puff Inhalation Daily   furosemide  80 mg Intravenous Q12H   [START ON 07/13/2022] insulin aspart  0-20 Units Subcutaneous TID WC   insulin aspart  0-5 Units Subcutaneous QHS   [START ON 07/13/2022] insulin aspart  4 Units Subcutaneous TID WC   insulin glargine-yfgn  32 Units Subcutaneous QHS   isosorbide mononitrate  30 mg Oral Daily   levothyroxine  137 mcg Oral QAC breakfast   metoprolol succinate  25 mg Oral BID   modafinil  200 mg Oral Daily   pravastatin  40 mg Oral QPM   predniSONE  40 mg Oral Q breakfast   rOPINIRole  4 mg Oral QHS   rOPINIRole  2 mg Oral BID   sodium chloride flush  3 mL Intravenous Q12H   Continuous Infusions:  sodium chloride     PRN Meds:.sodium chloride, acetaminophen **OR** acetaminophen, HYDROmorphone (DILAUDID) injection, ipratropium-albuterol, loratadine, ondansetron **OR** ondansetron (ZOFRAN) IV, sodium chloride flush   Anti-infectives (From admission, onward)    Start     Dose/Rate Route Frequency Ordered Stop   07/11/22 1745  azithromycin (ZITHROMAX) tablet 500 mg        500 mg Oral Daily 07/11/22 1731 07/16/22 0959         Subjective: [name] today has no fevers, no emesis,  No chest pain,  - Wife and daughter at bedside -Reports orthopnea-to sleep with bed reclined up -Significant dyspnea on exertion when walking to the bathroom -Fatigue and generalized weakness also -Cough and wheezing actually improving some   Objective: Vitals:   07/12/22 0201 07/12/22 0619 07/12/22 0752 07/12/22 1447  BP: 139/78 (!) 142/75  114/61  Pulse: 77 77 73 76  Resp: 20 20 14 18   Temp: 98 F (36.7 C) 98.4 F (36.9 C)  98.1 F (36.7 C)  TempSrc: Oral Oral  Oral  SpO2: 98% 98% 94% 96%  Weight:      Height:        Intake/Output  Summary (Last 24 hours) at 07/12/2022 1932 Last data filed at 07/12/2022 1828 Gross per 24 hour  Intake 723 ml  Output 400 ml  Net 323 ml   Filed Weights   07/11/22 1023 07/11/22 1805  Weight: 104.7 kg 103.1 kg    Physical Exam  Gen:- Awake Alert, speaking in sentences at rest but significant dyspnea on exertion HEENT:- [place].AT, No sclera icterus Neck-Supple Neck,No JVD,.  Lungs-somewhat diminished breath sounds, no significant wheezing  CV- S1, S2 normal, irregular  Abd-  +ve B.Sounds, Abd Soft, No tenderness, increased truncal adiposity    Extremity/Skin:-Trace edema, pedal pulses present  Psych-affect is appropriate, oriented x3 Neuro-no new focal deficits, no tremors  Data Reviewed: I have personally reviewed following labs and imaging studies  CBC: Recent Labs  Lab 07/11/22 1032  WBC 8.9  NEUTROABS 6.3  HGB 9.8*  HCT 30.5*  MCV 91.6  PLT 249  Basic Metabolic Panel: Recent Labs  Lab 07/11/22 1032 07/11/22 1420 07/12/22 0424  NA 134*  --  137  K 4.0  --  3.7  CL 101  --  101  CO2 23  --  26  GLUCOSE 369*  --  92  BUN 30*  --  33*  CREATININE 1.33*  --  1.38*  CALCIUM 8.7*  --  9.1  MG  --  2.2  --   PHOS  --  3.8  --    GFR: Estimated Creatinine Clearance: 53.3 mL/min (A) (by C-G formula based on SCr of 1.38 mg/dL (H)). Liver Function Tests: Recent Labs  Lab 07/11/22 1032  AST 14*  ALT 16  ALKPHOS 57  BILITOT 0.8  PROT 7.1  ALBUMIN 3.6   HbA1C: Recent Labs    07/11/22 1420  HGBA1C 8.9*    Radiology Studies: CT Angio Chest PE W and/or Wo Contrast  Result Date: 07/11/2022 CLINICAL DATA:  Pulmonary embolism (PE) suspected, high prob; Epigastric pain EXAM: CT ANGIOGRAPHY CHEST CT ABDOMEN AND PELVIS WITH CONTRAST TECHNIQUE: Multidetector CT imaging of the chest was performed using the standard protocol during bolus administration of intravenous contrast. Multiplanar CT image reconstructions and MIPs were obtained to evaluate the vascular anatomy.  Multidetector CT imaging of the abdomen and pelvis was performed using the standard protocol during bolus administration of intravenous contrast. RADIATION DOSE REDUCTION: This exam was performed according to the departmental dose-optimization program which includes automated exposure control, adjustment of the mA and/or kV according to patient size and/or use of iterative reconstruction technique. CONTRAST:  OMNIPAQUE IOHEXOL 350 MG/ML SOLN COMPARISON:  07/08/2022 CT, 03/11/2021 CT FINDINGS: CTA CHEST FINDINGS Cardiovascular: Satisfactory opacification of the pulmonary arteries to the segmental level. No evidence of pulmonary embolism. Thoracic aorta is nonaneurysmal. Atherosclerotic calcifications of the aorta and coronary arteries. Upper normal heart size. No pericardial effusion. Mediastinum/Nodes: No enlarged mediastinal, hilar, or axillary lymph nodes. Thyroid gland, trachea, and esophagus demonstrate no significant findings. Lungs/Pleura: Trace left pleural effusion. Mild ground-glass attenuation with interlobular septal thickening, more pronounced within the perihilar regions and upper lobes bilaterally. No pneumothorax. Musculoskeletal: No chest wall abnormality. No acute or significant osseous findings. Anterior endplate spurring within the mid to lower thoracic spine. Review of the MIP images confirms the above findings. CT ABDOMEN and PELVIS FINDINGS Hepatobiliary: No focal liver abnormality is seen. No gallstones, gallbladder wall thickening, or biliary dilatation. Pancreas: Unremarkable. No pancreatic ductal dilatation or surrounding inflammatory changes. Spleen: Normal in size without focal abnormality. Adrenals/Urinary Tract: Unremarkable adrenal glands. Kidneys enhance symmetrically without focal lesion, stone, or hydronephrosis. Ureters are nondilated. Urinary bladder appears unremarkable for the degree of distention. Stomach/Bowel: Stomach is within normal limits. Appendix appears normal  (series 2, image 34). No evidence of bowel wall thickening, distention, or inflammatory changes. Vascular/Lymphatic: Aortic atherosclerosis. No enlarged abdominal or pelvic lymph nodes. Reproductive: Prostatomegaly. Other: No free fluid. No abdominopelvic fluid collection. No pneumoperitoneum. No abdominal wall hernia. Musculoskeletal: No acute or significant osseous findings. Unchanged soft tissue thickening along the lower anterior abdominal wall, likely reflecting injection-related changes. Review of the MIP images confirms the above findings. IMPRESSION: 1. No evidence of pulmonary embolism. 2. Mild ground-glass attenuation with interlobular septal thickening, more pronounced within the perihilar regions and upper lobes bilaterally. Findings are favored to represent mild pulmonary edema. 3. Trace left pleural effusion. 4. No acute abdominopelvic findings. 5. Prostatomegaly. 6. Aortic and coronary artery atherosclerosis (ICD10-I70.0). Electronically Signed   By: Duanne Guess D.O.  On: 07/11/2022 12:31   CT ABDOMEN PELVIS W CONTRAST  Result Date: 07/11/2022 CLINICAL DATA:  Pulmonary embolism (PE) suspected, high prob; Epigastric pain EXAM: CT ANGIOGRAPHY CHEST CT ABDOMEN AND PELVIS WITH CONTRAST TECHNIQUE: Multidetector CT imaging of the chest was performed using the standard protocol during bolus administration of intravenous contrast. Multiplanar CT image reconstructions and MIPs were obtained to evaluate the vascular anatomy. Multidetector CT imaging of the abdomen and pelvis was performed using the standard protocol during bolus administration of intravenous contrast. RADIATION DOSE REDUCTION: This exam was performed according to the departmental dose-optimization program which includes automated exposure control, adjustment of the mA and/or kV according to patient size and/or use of iterative reconstruction technique. CONTRAST:  OMNIPAQUE IOHEXOL 350 MG/ML SOLN COMPARISON:  07/08/2022 CT,  03/11/2021 CT FINDINGS: CTA CHEST FINDINGS Cardiovascular: Satisfactory opacification of the pulmonary arteries to the segmental level. No evidence of pulmonary embolism. Thoracic aorta is nonaneurysmal. Atherosclerotic calcifications of the aorta and coronary arteries. Upper normal heart size. No pericardial effusion. Mediastinum/Nodes: No enlarged mediastinal, hilar, or axillary lymph nodes. Thyroid gland, trachea, and esophagus demonstrate no significant findings. Lungs/Pleura: Trace left pleural effusion. Mild ground-glass attenuation with interlobular septal thickening, more pronounced within the perihilar regions and upper lobes bilaterally. No pneumothorax. Musculoskeletal: No chest wall abnormality. No acute or significant osseous findings. Anterior endplate spurring within the mid to lower thoracic spine. Review of the MIP images confirms the above findings. CT ABDOMEN and PELVIS FINDINGS Hepatobiliary: No focal liver abnormality is seen. No gallstones, gallbladder wall thickening, or biliary dilatation. Pancreas: Unremarkable. No pancreatic ductal dilatation or surrounding inflammatory changes. Spleen: Normal in size without focal abnormality. Adrenals/Urinary Tract: Unremarkable adrenal glands. Kidneys enhance symmetrically without focal lesion, stone, or hydronephrosis. Ureters are nondilated. Urinary bladder appears unremarkable for the degree of distention. Stomach/Bowel: Stomach is within normal limits. Appendix appears normal (series 2, image 34). No evidence of bowel wall thickening, distention, or inflammatory changes. Vascular/Lymphatic: Aortic atherosclerosis. No enlarged abdominal or pelvic lymph nodes. Reproductive: Prostatomegaly. Other: No free fluid. No abdominopelvic fluid collection. No pneumoperitoneum. No abdominal wall hernia. Musculoskeletal: No acute or significant osseous findings. Unchanged soft tissue thickening along the lower anterior abdominal wall, likely reflecting  injection-related changes. Review of the MIP images confirms the above findings. IMPRESSION: 1. No evidence of pulmonary embolism. 2. Mild ground-glass attenuation with interlobular septal thickening, more pronounced within the perihilar regions and upper lobes bilaterally. Findings are favored to represent mild pulmonary edema. 3. Trace left pleural effusion. 4. No acute abdominopelvic findings. 5. Prostatomegaly. 6. Aortic and coronary artery atherosclerosis (ICD10-I70.0). Electronically Signed   By: Duanne Guess D.O.   On: 07/11/2022 12:31   DG Chest Portable 1 View  Result Date: 07/11/2022 CLINICAL DATA:  dyspnea EXAM: PORTABLE CHEST 1 VIEW COMPARISON:  CXR 06/06/22 FINDINGS: No pleural effusion. No pneumothorax. Unchanged cardiac and mediastinal contours. Low lung volumes. Hazy bibasilar airspace opacities are favored to represent atelectasis. No radiographically apparent displaced rib fractures. Visualized upper abdomen is unremarkable. IMPRESSION: Low lung volumes with bibasilar atelectasis Electronically Signed   By: Lorenza Cambridge M.D.   On: 07/11/2022 11:28    Scheduled Meds:  alfuzosin  10 mg Oral Daily   apixaban  5 mg Oral BID   azithromycin  500 mg Oral Daily   ferrous sulfate  325 mg Oral Q breakfast   fluticasone furoate-vilanterol  1 puff Inhalation Daily   And   umeclidinium bromide  1 puff Inhalation Daily   furosemide  80 mg  Intravenous Q12H   [START ON 07/13/2022] insulin aspart  0-20 Units Subcutaneous TID WC   insulin aspart  0-5 Units Subcutaneous QHS   [START ON 07/13/2022] insulin aspart  4 Units Subcutaneous TID WC   insulin glargine-yfgn  32 Units Subcutaneous QHS   isosorbide mononitrate  30 mg Oral Daily   levothyroxine  137 mcg Oral QAC breakfast   metoprolol succinate  25 mg Oral BID   modafinil  200 mg Oral Daily   pravastatin  40 mg Oral QPM   predniSONE  40 mg Oral Q breakfast   rOPINIRole  4 mg Oral QHS   rOPINIRole  2 mg Oral BID   sodium chloride flush   3 mL Intravenous Q12H   Continuous Infusions:  sodium chloride     LOS: 0 days   Shon Hale M.D on 07/12/2022 at 7:32 PM  Go to [email or web address] - for contact info  [facility] - Office  [phone]  If 7PM-7AM, please contact night-coverage [email or web address] 07/12/2022, 7:32 PM

## 2022-07-12 NOTE — Inpatient Diabetes Management (Addendum)
Inpatient Diabetes Program Recommendations  AACE/ADA: New Consensus Statement on Inpatient Glycemic Control   Target Ranges:  Prepandial:   less than 140 mg/dL      Peak postprandial:   less than 180 mg/dL (1-2 hours)      Critically ill patients:  140 - 180 mg/dL    Latest Reference Range & Units 07/11/22 17:26 07/11/22 21:27 07/12/22 07:46 07/12/22 08:32 07/12/22 11:57  Glucose-Capillary 70 - 99 mg/dL 161 (H) 096 (H) 53 (L) 76 215 (H)   Review of Glycemic Control  Diabetes history: DM2 Outpatient Diabetes medications: Toujeo 120 units at bedtime, Fiasp 30-38 units TID with meals, Ozempic 1 mg Qweek (Monday) Current orders for Inpatient glycemic control: Semglee 35 units at bedtime, Novolog 0-15 units TID with meals, Novolog 0-5 units at bedtime; Prednisone 40 mg QAM  NOTE: Patient admitted with acute on chronic HF, interstitial pulmonary disease, and hyperglycemia. In reviewing chart, noted patient sees Percell Boston, NP at [facility] and was last seen on 04/03/22. Per office note on 04/03/22, patient's A1C was 8.9% and patient was prescribed Humulin R U500 90 units with breakfast and lunch, Humulin R U500 75 units with supper, and Ozempic 1 mg Qweek for DM control. Current home medication list in chart has DM medications as Toujeo, Fiasp, and Ozempic. Called patient over the phone to inquire about DM medications. Patient states he is taking Toujeo 120 units at bedtime, Fiasp 30-38 units TID with meals, and Ozempic 1 mg Qweek (Monday) for DM control. Patient states he had ran out of Humulin R U500 insulin so Percell Boston, NP had him switch to [place] and Fiasp insulin. Patient states he now has Humulin R U500 insulin at home and he plans to switch back to it once he uses up Toujeo and Fiasp insulin he has at home. Patient reports that he uses a CGM sensor for glucose monitoring. Patient reports that he has hypoglycemia at least 2 times a week and it usually occurs during the night (was down in  the 30's one night in the past week). Inquired about symptoms today when CBG was 53 mg/dl at 0:45 am. Patient states he did not have any symptoms of hypoglycemia this morning and he did not know his glucose was low until staff woke him up and let him know. Encouraged patient to be sure to reach out to Endocrinology office to let them know about hypoglycemia so they can make adjustments with insulin dosages if needed. Discussed current insulin orders and that it would be requested that the Semglee dose be decreased. Patient verbalized understanding of information and has no questions at this time.  Thanks, Orlando Penner, RN, MSN, CDCES Diabetes Coordinator Inpatient Diabetes Program [phone] (Team Pager from 8am to 5pm)

## 2022-07-12 NOTE — Inpatient Diabetes Management (Addendum)
Inpatient Diabetes Program Recommendations  AACE/ADA: New Consensus Statement on Inpatient Glycemic Control (2015)  Target Ranges:  Prepandial:   less than 140 mg/dL      Peak postprandial:   less than 180 mg/dL (1-2 hours)      Critically ill patients:  140 - 180 mg/dL   Lab Results  Component Value Date   GLUCAP 215 (H) 07/12/2022   HGBA1C 8.9 (H) 07/11/2022    Review of Glycemic Control  Latest Reference Range & Units 07/11/22 17:26 07/11/22 21:27 07/12/22 07:46 07/12/22 08:32 07/12/22 11:57  Glucose-Capillary 70 - 99 mg/dL 161 (H) 096 (H) 53 (L) 76 215 (H)   Diabetes history: DM2 Outpatient Diabetes medications:  Dexcom G7 sensor Fiasp 30 units tid with meals Toujeo 120 units q HS Current orders for Inpatient glycemic control:  Novolog 0-15 units tid with meals and HS Semglee 35 units q HS Prednisone 40 mg daily  Inpatient Diabetes Program Recommendations:    Note low CBG this AM. Consider reducing Semglee to 30 units q HS. Also consider adding Novolog meal coverage 3 units tid with meals (hold if patient eats less than 50% or NPO) to prevent post-pranidal rises in blood sugars.   Thanks,  Beryl Meager, RN, BC-ADM Inpatient Diabetes Coordinator Pager [phone]  (8a-5p)

## 2022-07-12 NOTE — TOC Initial Note (Signed)
Transition of Care [facility]) - Initial/Assessment Note    Patient Details  Name: Paul Baker MRN: [id number] Date of Birth: [birth year]  Transition of Care [facility]) CM/SW Contact:    Annice Needy, LCSW Phone Number: 07/12/2022, 3:36 PM  Clinical Narrative:                 Patient from home with spouse. Admitted for CHF. Consult for heart failure home screen. Follows HH diet, takes daily weights, independent, maintains medical appointments.   Expected Discharge Plan: Home/Self Care Barriers to Discharge: Continued Medical Work up   Patient Goals and CMS Choice Patient states their goals for this hospitalization and ongoing recovery are:: return          Expected Discharge Plan and Services In-house Referral:  (walker (does not use))     Living arrangements for the past 2 months: Single Family Home                                      Prior Living Arrangements/Services Living arrangements for the past 2 months: Single Family Home Lives with:: Spouse Patient language and need for interpreter reviewed:: Yes        Need for Family Participation in Patient Care: Yes (Comment) Care giver support system in place?: Yes (comment) Current home services: DME Criminal Activity/Legal Involvement Pertinent to Current Situation/Hospitalization: No - Comment as needed  Activities of Daily Living Home Assistive Devices/Equipment: None ADL Screening (condition at time of admission) Patient's cognitive ability adequate to safely complete daily activities?: Yes Is the patient deaf or have difficulty hearing?: No Does the patient have difficulty seeing, even when wearing glasses/contacts?: No Does the patient have difficulty concentrating, remembering, or making decisions?: No Patient able to express need for assistance with ADLs?: Yes Does the patient have difficulty dressing or bathing?: No Independently performs ADLs?: Yes (appropriate for developmental age) Does the  patient have difficulty walking or climbing stairs?: No Weakness of Legs: None Weakness of Arms/Hands: None  Permission Sought/Granted Permission sought to share information with : Family Supports    Share Information with NAME: Mrs. Trotter           Emotional Assessment     Affect (typically observed): Appropriate Orientation: : Oriented to Self, Oriented to Place, Oriented to  Time, Oriented to Situation Alcohol / Substance Use: Not Applicable Psych Involvement: No (comment)  Admission diagnosis:  Acute on chronic diastolic HF (heart failure) (HCC) [I50.33] Acute on chronic congestive heart failure, unspecified heart failure type [facility]) [I50.9] Patient Active Problem List   Diagnosis Date Noted   Acute on chronic diastolic HF (heart failure) (HCC) 07/11/2022   Cellulitis of right hand 05/23/2022   Interstitial pulmonary disease (HCC) 05/04/2022   Elevated serum immunoglobulin free light chains 04/21/2022   Normocytic anemia 04/21/2022   Severe persistent asthma without complication 02/24/2022   Carotid artery disease, unspecified laterality, unspecified type (HCC) 02/24/2022   Grade I diastolic dysfunction 12/05/2019   Paroxysmal atrial fibrillation (HCC)    Hypothyroidism    Asthma 08/05/2019   Class 2 obesity 08/05/2019   Mixed hyperlipidemia 11/25/2014   S/P knee replacement 03/23/2014   Spinal stenosis of cervicothoracic region 10/08/2013   RLS (restless legs syndrome) 10/08/2013   DM type 2 causing vascular disease (HCC) 10/08/2013   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/02/2013   Diabetic neuropathy (HCC) 07/23/2012   OSA on CPAP 04/19/2012  Essential hypertension, benign 12/16/2009   Coronary artery disease involving native coronary artery of native heart with angina pectoris (HCC) 12/16/2009   Hyperglycemia due to type 2 diabetes mellitus (HCC) 11/30/2009   PCP:  [name], MD Pharmacy:   [facility] [address] [place] - [address] [place]  [address] [place] [address] Phone: [phone] Fax: [phone]  [facility] - [place], [place] - [address] [place] [address] Phone: [phone] Fax: [phone]     Social Determinants of Health (SDOH) Social History: SDOH Screenings   Food Insecurity: Patient Declined (07/12/2022)  Housing: Patient Declined (05/05/2022)  Transportation Needs: No Transportation Needs (05/05/2022)  Utilities: Not At Risk (05/05/2022)  Alcohol Screen: Low Risk  (05/05/2022)  Depression (PHQ2-9): Low Risk  (05/26/2022)  Financial Resource Strain: Low Risk  (05/05/2022)  Physical Activity: Insufficiently Active (05/05/2022)  Social Connections: Socially Integrated (05/05/2022)  Stress: No Stress Concern Present (05/05/2022)  Tobacco Use: Medium Risk (07/11/2022)   SDOH Interventions:     Readmission Risk Interventions     No data to display

## 2022-07-12 NOTE — Plan of Care (Signed)

## 2022-07-13 ENCOUNTER — Other Ambulatory Visit: Payer: Self-pay | Admitting: Family Medicine

## 2022-07-13 ENCOUNTER — Encounter (HOSPITAL_BASED_OUTPATIENT_CLINIC_OR_DEPARTMENT_OTHER): Payer: Self-pay | Admitting: Pulmonary Disease

## 2022-07-13 DIAGNOSIS — Z8249 Family history of ischemic heart disease and other diseases of the circulatory system: Secondary | ICD-10-CM | POA: Diagnosis not present

## 2022-07-13 DIAGNOSIS — Z85828 Personal history of other malignant neoplasm of skin: Secondary | ICD-10-CM | POA: Diagnosis not present

## 2022-07-13 DIAGNOSIS — E039 Hypothyroidism, unspecified: Secondary | ICD-10-CM | POA: Diagnosis not present

## 2022-07-13 DIAGNOSIS — E11649 Type 2 diabetes mellitus with hypoglycemia without coma: Secondary | ICD-10-CM | POA: Diagnosis not present

## 2022-07-13 DIAGNOSIS — Z86718 Personal history of other venous thrombosis and embolism: Secondary | ICD-10-CM | POA: Diagnosis not present

## 2022-07-13 DIAGNOSIS — I509 Heart failure, unspecified: Secondary | ICD-10-CM

## 2022-07-13 DIAGNOSIS — I480 Paroxysmal atrial fibrillation: Secondary | ICD-10-CM | POA: Diagnosis not present

## 2022-07-13 DIAGNOSIS — J849 Interstitial pulmonary disease, unspecified: Secondary | ICD-10-CM | POA: Diagnosis not present

## 2022-07-13 DIAGNOSIS — Z96651 Presence of right artificial knee joint: Secondary | ICD-10-CM | POA: Diagnosis not present

## 2022-07-13 DIAGNOSIS — G2581 Restless legs syndrome: Secondary | ICD-10-CM | POA: Diagnosis not present

## 2022-07-13 DIAGNOSIS — N179 Acute kidney failure, unspecified: Secondary | ICD-10-CM | POA: Diagnosis not present

## 2022-07-13 DIAGNOSIS — E1165 Type 2 diabetes mellitus with hyperglycemia: Secondary | ICD-10-CM | POA: Diagnosis not present

## 2022-07-13 DIAGNOSIS — Z6839 Body mass index (BMI) 39.0-39.9, adult: Secondary | ICD-10-CM | POA: Diagnosis not present

## 2022-07-13 DIAGNOSIS — E782 Mixed hyperlipidemia: Secondary | ICD-10-CM

## 2022-07-13 DIAGNOSIS — Z794 Long term (current) use of insulin: Secondary | ICD-10-CM | POA: Diagnosis not present

## 2022-07-13 DIAGNOSIS — Z87891 Personal history of nicotine dependence: Secondary | ICD-10-CM | POA: Diagnosis not present

## 2022-07-13 DIAGNOSIS — Z7901 Long term (current) use of anticoagulants: Secondary | ICD-10-CM | POA: Diagnosis not present

## 2022-07-13 DIAGNOSIS — E1122 Type 2 diabetes mellitus with diabetic chronic kidney disease: Secondary | ICD-10-CM | POA: Diagnosis not present

## 2022-07-13 DIAGNOSIS — I5033 Acute on chronic diastolic (congestive) heart failure: Secondary | ICD-10-CM | POA: Diagnosis not present

## 2022-07-13 DIAGNOSIS — I130 Hypertensive heart and chronic kidney disease with heart failure and stage 1 through stage 4 chronic kidney disease, or unspecified chronic kidney disease: Secondary | ICD-10-CM | POA: Diagnosis not present

## 2022-07-13 DIAGNOSIS — I2510 Atherosclerotic heart disease of native coronary artery without angina pectoris: Secondary | ICD-10-CM | POA: Diagnosis not present

## 2022-07-13 DIAGNOSIS — N1831 Chronic kidney disease, stage 3a: Secondary | ICD-10-CM | POA: Diagnosis not present

## 2022-07-13 DIAGNOSIS — J45909 Unspecified asthma, uncomplicated: Secondary | ICD-10-CM | POA: Diagnosis not present

## 2022-07-13 DIAGNOSIS — G253 Myoclonus: Secondary | ICD-10-CM | POA: Diagnosis not present

## 2022-07-13 LAB — GLUCOSE, CAPILLARY
Glucose-Capillary: 287 mg/dL — ABNORMAL HIGH (ref 70–99)
Glucose-Capillary: 280 mg/dL — ABNORMAL HIGH (ref 70–99)
Glucose-Capillary: 261 mg/dL — ABNORMAL HIGH (ref 70–99)
Glucose-Capillary: 457 mg/dL — ABNORMAL HIGH (ref 70–99)
Glucose-Capillary: 459 mg/dL — ABNORMAL HIGH (ref 70–99)
Glucose-Capillary: 461 mg/dL — ABNORMAL HIGH (ref 70–99)

## 2022-07-13 LAB — BASIC METABOLIC PANEL
Anion gap: 11 (ref 5–15)
BUN: 48 mg/dL — ABNORMAL HIGH (ref 8–23)
CO2: 25 mmol/L (ref 22–32)
Calcium: 9.3 mg/dL (ref 8.9–10.3)
Chloride: 94 mmol/L — ABNORMAL LOW (ref 98–111)
Creatinine, Ser: 1.6 mg/dL — ABNORMAL HIGH (ref 0.61–1.24)
GFR, Estimated: 46 mL/min — ABNORMAL LOW (ref >60)
Glucose, Bld: 305 mg/dL — ABNORMAL HIGH (ref 70–99)
Potassium: 4.5 mmol/L (ref 3.5–5.1)
Sodium: 130 mmol/L — ABNORMAL LOW (ref 135–145)

## 2022-07-13 MED ORDER — SENNOSIDES-DOCUSATE SODIUM 8.6-50 MG PO TABS
2.0000 | ORAL_TABLET | Freq: Once | ORAL | Status: AC
  Administered 2022-07-13: 2 via ORAL
  Filled 2022-07-13: qty 2

## 2022-07-13 MED ORDER — DM-GUAIFENESIN ER 30-600 MG PO TB12
1.0000 | ORAL_TABLET | Freq: Two times a day (BID) | ORAL | Status: DC
  Administered 2022-07-13 – 2022-07-14 (×3): 1 via ORAL
  Filled 2022-07-13 (×3): qty 1

## 2022-07-13 MED ORDER — INSULIN GLARGINE-YFGN 100 UNIT/ML ~~LOC~~ SOLN
35.0000 [IU] | Freq: Every day | SUBCUTANEOUS | Status: DC
  Filled 2022-07-13: qty 0.35

## 2022-07-13 MED ORDER — INSULIN GLARGINE-YFGN 100 UNIT/ML ~~LOC~~ SOLN
42.0000 [IU] | Freq: Every day | SUBCUTANEOUS | Status: DC
  Filled 2022-07-13: qty 0.42

## 2022-07-13 MED ORDER — POLYETHYLENE GLYCOL 3350 17 G PO PACK
17.0000 g | PACK | Freq: Every day | ORAL | Status: DC
  Administered 2022-07-13 – 2022-07-14 (×2): 17 g via ORAL
  Filled 2022-07-13: qty 1

## 2022-07-13 MED ORDER — SENNOSIDES-DOCUSATE SODIUM 8.6-50 MG PO TABS
2.0000 | ORAL_TABLET | Freq: Every day | ORAL | Status: DC
Start: 2022-07-13 — End: 2022-07-13

## 2022-07-13 MED ORDER — GUAIFENESIN 100 MG/5ML PO LIQD
15.0000 mL | Freq: Once | ORAL | Status: AC
  Administered 2022-07-13: 15 mL via ORAL
  Filled 2022-07-13: qty 15

## 2022-07-13 MED ORDER — FUROSEMIDE 10 MG/ML IJ SOLN
40.0000 mg | Freq: Two times a day (BID) | INTRAMUSCULAR | Status: DC
  Administered 2022-07-13 – 2022-07-14 (×3): 40 mg via INTRAVENOUS
  Filled 2022-07-13 (×3): qty 4

## 2022-07-13 MED ORDER — INSULIN GLARGINE-YFGN 100 UNIT/ML ~~LOC~~ SOLN
44.0000 [IU] | Freq: Every day | SUBCUTANEOUS | Status: DC
  Administered 2022-07-13: 44 [IU] via SUBCUTANEOUS
  Filled 2022-07-13 (×2): qty 0.44

## 2022-07-13 MED ORDER — INSULIN ASPART 100 UNIT/ML IJ SOLN
22.0000 [IU] | Freq: Once | INTRAMUSCULAR | Status: AC
  Administered 2022-07-13: 22 [IU] via SUBCUTANEOUS

## 2022-07-13 MED ORDER — INSULIN ASPART 100 UNIT/ML IJ SOLN
28.0000 [IU] | Freq: Once | INTRAMUSCULAR | Status: AC
  Administered 2022-07-13: 28 [IU] via SUBCUTANEOUS

## 2022-07-13 NOTE — Progress Notes (Signed)
PROGRESS NOTE   Paul Baker, is a 72 y.o. male, DOB - [birth year], ZOX:[id number]  Admit date - 07/11/2022   Admitting Physician Vassie Loll, MD  Outpatient Primary MD for the patient is Paul Sciara, MD  LOS - 0  Chief Complaint  Patient presents with   Shortness of Breath      Brief Narrative:  72 y.o. male with medical history significant of obstructive sleep apnea on CPAP, interstitial lung disease, restless leg syndrome, uncontrolled type 2 diabetes with nephropathy (stage IIIa), hypertension, hypothyroidism, hyperlipidemia, class II obesity, diastolic heart failure and paroxysmal atrial fibrillation admitted on 07/11/2022 with greater than 10 pound weight gain and significant dyspnea as well as dyspnea on exertion consistent with acute on chronic diastolic dysfunction CHF    -Assessment and Plan: *1)Acute on chronic diastolic HF (heart failure) (HCC) --Baseline weight usually 215  to 217 lb -Admitted with a weight of 230 pounds -BNP not elevated but could be misleading in the setting of obesity -Mild bronchitic changes appreciated on x-ray. -Voiding well with IV Lasix but per documentation -Probably will benefit of being discharged on Demadex (as he was already taking 40 mg twice a day of Lasix without improvement or control of his condition). -Recent echo demonstrating preserved ejection fraction and grade 1 diastolic dysfunction. REDs Vest requested- 07/13/22 Neg fluid balance- -Wt 230.82 >>227 >>222.6 -Dyspnea on exertion improving  2)Interstitial pulmonary disease (HCC) -Given bronchitic changes, complains of productive coughing spells (yellow sputum), he might be experiencing mild exacerbation. -Resume home bronchodilator management; 5 days of prednisone 40 mg and a short course of Zithromax will be provided -No hypoxia -Dyspnea improving  Hypothyroidism -update TSH -Continue Synthroid.  Paroxysmal atrial fibrillation (HCC) -Stable, metoprolol for rate  control and Eliquis for stroke prophylaxis  Class 2 obesity -Low calorie diet, portion control and increase physical activity discussed with patient -Body mass index is 38.22 kg/m.  Restless legs syndrome with familial myoclonus -Continue treatment with the use of Requip as previously prescribed.  OSA on CPAP -Continue the use of CPAP nightly.  Essential hypertension, benign -Continue metoprolol and Imdur -Holding Cozaar in order to safe room for diuresis -Follow vital signs and adjust antihypertensive treatment as required -Patient instructed to follow heart healthy/low-sodium diet.  Hyperglycemia due to type 2 diabetes mellitus (HCC) -A1c has been 8.9 each time is being checked over the last 8 months or so -Patient's insulin regimen was recently changed just about a month ago -Had episode of hypoglycemia this morning -Sugars may be more erratic given prednisone use needs admission -Continue sliding scale insulin and the use of Semglee- -diabetic educator input appreciated  -Semglee dose adjusted -Use Novolog/Humalog Sliding scale insulin with Accu-Cheks/Fingersticks as ordered  -Aki on CKD stage -3A    -Creatinine was 1.3 range on admission which is not far from baseline, Creatinine is Now up to 1.60 with diuresis---c/n to Monitor closely while diuresing - renally adjust medications, avoid nephrotoxic agents / dehydration  / hypotension   Disposition: The patient is from: Home              Anticipated d/c is to: Home              Anticipated d/c date is: 1 day              Patient currently is not medically stable to d/c. Barriers: Not Clinically Stable-   Code Status :  -  Code Status: Full Code   Family Communication:   (patient is  alert, awake and coherent)  Discussed with wife and daughter at bedside  DVT Prophylaxis  :   - SCDs    apixaban (ELIQUIS) tablet 5 mg   Lab Results  Component Value Date   PLT 249 07/11/2022   Inpatient Medications  Scheduled  Meds:  alfuzosin  10 mg Oral Daily   apixaban  5 mg Oral BID   azithromycin  500 mg Oral Daily   ferrous sulfate  325 mg Oral Q breakfast   fluticasone furoate-vilanterol  1 puff Inhalation Daily   And   umeclidinium bromide  1 puff Inhalation Daily   furosemide  40 mg Intravenous Q12H   insulin aspart  0-20 Units Subcutaneous TID WC   insulin aspart  0-5 Units Subcutaneous QHS   insulin aspart  4 Units Subcutaneous TID WC   insulin glargine-yfgn  32 Units Subcutaneous QHS   isosorbide mononitrate  30 mg Oral Daily   levothyroxine  137 mcg Oral QAC breakfast   metoprolol succinate  25 mg Oral BID   modafinil  200 mg Oral Daily   pravastatin  40 mg Oral QPM   predniSONE  40 mg Oral Q breakfast   rOPINIRole  4 mg Oral QHS   rOPINIRole  2 mg Oral BID   sodium chloride flush  3 mL Intravenous Q12H   Continuous Infusions:  sodium chloride     PRN Meds:.sodium chloride, acetaminophen **OR** acetaminophen, HYDROmorphone (DILAUDID) injection, ipratropium-albuterol, loratadine, ondansetron **OR** ondansetron (ZOFRAN) IV, sodium chloride flush   Anti-infectives (From admission, onward)    Start     Dose/Rate Route Frequency Ordered Stop   07/11/22 1745  azithromycin (ZITHROMAX) tablet 500 mg        500 mg Oral Daily 07/11/22 1731 07/16/22 0959       Subjective: [name] today has no fevers, no emesis,  No chest pain,  - Wife and daughter at bedside -Reports orthopnea-to sleep with bed reclined up -Significant dyspnea on exertion when walking to the bathroom -Fatigue and generalized weakness also -Cough and wheezing actually improving some   Objective: Vitals:   07/12/22 2201 07/13/22 0417 07/13/22 0500 07/13/22 0737  BP: 106/70 (!) 144/86    Pulse: 95 73  74  Resp: 20 18  17   Temp: 98.3 F (36.8 C) 97.6 F (36.4 C)    TempSrc: Oral     SpO2: 94% 96%  96%  Weight:   101 kg   Height:        Intake/Output Summary (Last 24 hours) at 07/13/2022 0806 Last data  filed at 07/13/2022 0545 Gross per 24 hour  Intake 1203 ml  Output 2000 ml  Net -797 ml   Filed Weights   07/11/22 1023 07/11/22 1805 07/13/22 0500  Weight: 104.7 kg 103.1 kg 101 kg    Physical Exam  Gen:- Awake Alert, speaking in sentences at rest but significant dyspnea on exertion HEENT:- [place].AT, No sclera icterus Neck-Supple Neck,No JVD,.  Lungs-somewhat diminished breath sounds, no significant wheezing  CV- S1, S2 normal, irregular  Abd-  +ve B.Sounds, Abd Soft, No tenderness, increased truncal adiposity    Extremity/Skin:-Trace edema, pedal pulses present  Psych-affect is appropriate, oriented x3 Neuro-no new focal deficits, no tremors  Data Reviewed: I have personally reviewed following labs and imaging studies  CBC: Recent Labs  Lab 07/11/22 1032  WBC 8.9  NEUTROABS 6.3  HGB 9.8*  HCT 30.5*  MCV 91.6  PLT 249   Basic Metabolic Panel: Recent Labs  Lab 07/11/22  1032 07/11/22 1420 07/12/22 0424 07/13/22 0455  NA 134*  --  137 130*  K 4.0  --  3.7 4.5  CL 101  --  101 94*  CO2 23  --  26 25  GLUCOSE 369*  --  92 305*  BUN 30*  --  33* 48*  CREATININE 1.33*  --  1.38* 1.60*  CALCIUM 8.7*  --  9.1 9.3  MG  --  2.2  --   --   PHOS  --  3.8  --   --    GFR: Estimated Creatinine Clearance: 45.5 mL/min (A) (by C-G formula based on SCr of 1.6 mg/dL (H)). Liver Function Tests: Recent Labs  Lab 07/11/22 1032  AST 14*  ALT 16  ALKPHOS 57  BILITOT 0.8  PROT 7.1  ALBUMIN 3.6   HbA1C: Recent Labs    07/11/22 1420  HGBA1C 8.9*    Radiology Studies: CT Angio Chest PE W and/or Wo Contrast  Result Date: 07/11/2022 CLINICAL DATA:  Pulmonary embolism (PE) suspected, high prob; Epigastric pain EXAM: CT ANGIOGRAPHY CHEST CT ABDOMEN AND PELVIS WITH CONTRAST TECHNIQUE: Multidetector CT imaging of the chest was performed using the standard protocol during bolus administration of intravenous contrast. Multiplanar CT image reconstructions and MIPs were obtained to  evaluate the vascular anatomy. Multidetector CT imaging of the abdomen and pelvis was performed using the standard protocol during bolus administration of intravenous contrast. RADIATION DOSE REDUCTION: This exam was performed according to the departmental dose-optimization program which includes automated exposure control, adjustment of the mA and/or kV according to patient size and/or use of iterative reconstruction technique. CONTRAST:  OMNIPAQUE IOHEXOL 350 MG/ML SOLN COMPARISON:  07/08/2022 CT, 03/11/2021 CT FINDINGS: CTA CHEST FINDINGS Cardiovascular: Satisfactory opacification of the pulmonary arteries to the segmental level. No evidence of pulmonary embolism. Thoracic aorta is nonaneurysmal. Atherosclerotic calcifications of the aorta and coronary arteries. Upper normal heart size. No pericardial effusion. Mediastinum/Nodes: No enlarged mediastinal, hilar, or axillary lymph nodes. Thyroid gland, trachea, and esophagus demonstrate no significant findings. Lungs/Pleura: Trace left pleural effusion. Mild ground-glass attenuation with interlobular septal thickening, more pronounced within the perihilar regions and upper lobes bilaterally. No pneumothorax. Musculoskeletal: No chest wall abnormality. No acute or significant osseous findings. Anterior endplate spurring within the mid to lower thoracic spine. Review of the MIP images confirms the above findings. CT ABDOMEN and PELVIS FINDINGS Hepatobiliary: No focal liver abnormality is seen. No gallstones, gallbladder wall thickening, or biliary dilatation. Pancreas: Unremarkable. No pancreatic ductal dilatation or surrounding inflammatory changes. Spleen: Normal in size without focal abnormality. Adrenals/Urinary Tract: Unremarkable adrenal glands. Kidneys enhance symmetrically without focal lesion, stone, or hydronephrosis. Ureters are nondilated. Urinary bladder appears unremarkable for the degree of distention. Stomach/Bowel: Stomach is within normal  limits. Appendix appears normal (series 2, image 34). No evidence of bowel wall thickening, distention, or inflammatory changes. Vascular/Lymphatic: Aortic atherosclerosis. No enlarged abdominal or pelvic lymph nodes. Reproductive: Prostatomegaly. Other: No free fluid. No abdominopelvic fluid collection. No pneumoperitoneum. No abdominal wall hernia. Musculoskeletal: No acute or significant osseous findings. Unchanged soft tissue thickening along the lower anterior abdominal wall, likely reflecting injection-related changes. Review of the MIP images confirms the above findings. IMPRESSION: 1. No evidence of pulmonary embolism. 2. Mild ground-glass attenuation with interlobular septal thickening, more pronounced within the perihilar regions and upper lobes bilaterally. Findings are favored to represent mild pulmonary edema. 3. Trace left pleural effusion. 4. No acute abdominopelvic findings. 5. Prostatomegaly. 6. Aortic and coronary artery atherosclerosis (ICD10-I70.0). Electronically  Signed   By: Duanne Guess D.O.   On: 07/11/2022 12:31   CT ABDOMEN PELVIS W CONTRAST  Result Date: 07/11/2022 CLINICAL DATA:  Pulmonary embolism (PE) suspected, high prob; Epigastric pain EXAM: CT ANGIOGRAPHY CHEST CT ABDOMEN AND PELVIS WITH CONTRAST TECHNIQUE: Multidetector CT imaging of the chest was performed using the standard protocol during bolus administration of intravenous contrast. Multiplanar CT image reconstructions and MIPs were obtained to evaluate the vascular anatomy. Multidetector CT imaging of the abdomen and pelvis was performed using the standard protocol during bolus administration of intravenous contrast. RADIATION DOSE REDUCTION: This exam was performed according to the departmental dose-optimization program which includes automated exposure control, adjustment of the mA and/or kV according to patient size and/or use of iterative reconstruction technique. CONTRAST:  OMNIPAQUE IOHEXOL 350 MG/ML SOLN  COMPARISON:  07/08/2022 CT, 03/11/2021 CT FINDINGS: CTA CHEST FINDINGS Cardiovascular: Satisfactory opacification of the pulmonary arteries to the segmental level. No evidence of pulmonary embolism. Thoracic aorta is nonaneurysmal. Atherosclerotic calcifications of the aorta and coronary arteries. Upper normal heart size. No pericardial effusion. Mediastinum/Nodes: No enlarged mediastinal, hilar, or axillary lymph nodes. Thyroid gland, trachea, and esophagus demonstrate no significant findings. Lungs/Pleura: Trace left pleural effusion. Mild ground-glass attenuation with interlobular septal thickening, more pronounced within the perihilar regions and upper lobes bilaterally. No pneumothorax. Musculoskeletal: No chest wall abnormality. No acute or significant osseous findings. Anterior endplate spurring within the mid to lower thoracic spine. Review of the MIP images confirms the above findings. CT ABDOMEN and PELVIS FINDINGS Hepatobiliary: No focal liver abnormality is seen. No gallstones, gallbladder wall thickening, or biliary dilatation. Pancreas: Unremarkable. No pancreatic ductal dilatation or surrounding inflammatory changes. Spleen: Normal in size without focal abnormality. Adrenals/Urinary Tract: Unremarkable adrenal glands. Kidneys enhance symmetrically without focal lesion, stone, or hydronephrosis. Ureters are nondilated. Urinary bladder appears unremarkable for the degree of distention. Stomach/Bowel: Stomach is within normal limits. Appendix appears normal (series 2, image 34). No evidence of bowel wall thickening, distention, or inflammatory changes. Vascular/Lymphatic: Aortic atherosclerosis. No enlarged abdominal or pelvic lymph nodes. Reproductive: Prostatomegaly. Other: No free fluid. No abdominopelvic fluid collection. No pneumoperitoneum. No abdominal wall hernia. Musculoskeletal: No acute or significant osseous findings. Unchanged soft tissue thickening along the lower anterior abdominal wall,  likely reflecting injection-related changes. Review of the MIP images confirms the above findings. IMPRESSION: 1. No evidence of pulmonary embolism. 2. Mild ground-glass attenuation with interlobular septal thickening, more pronounced within the perihilar regions and upper lobes bilaterally. Findings are favored to represent mild pulmonary edema. 3. Trace left pleural effusion. 4. No acute abdominopelvic findings. 5. Prostatomegaly. 6. Aortic and coronary artery atherosclerosis (ICD10-I70.0). Electronically Signed   By: Duanne Guess D.O.   On: 07/11/2022 12:31   DG Chest Portable 1 View  Result Date: 07/11/2022 CLINICAL DATA:  dyspnea EXAM: PORTABLE CHEST 1 VIEW COMPARISON:  CXR 06/06/22 FINDINGS: No pleural effusion. No pneumothorax. Unchanged cardiac and mediastinal contours. Low lung volumes. Hazy bibasilar airspace opacities are favored to represent atelectasis. No radiographically apparent displaced rib fractures. Visualized upper abdomen is unremarkable. IMPRESSION: Low lung volumes with bibasilar atelectasis Electronically Signed   By: Lorenza Cambridge M.D.   On: 07/11/2022 11:28    Scheduled Meds:  alfuzosin  10 mg Oral Daily   apixaban  5 mg Oral BID   azithromycin  500 mg Oral Daily   ferrous sulfate  325 mg Oral Q breakfast   fluticasone furoate-vilanterol  1 puff Inhalation Daily   And   umeclidinium bromide  1 puff Inhalation Daily   furosemide  40 mg Intravenous Q12H   insulin aspart  0-20 Units Subcutaneous TID WC   insulin aspart  0-5 Units Subcutaneous QHS   insulin aspart  4 Units Subcutaneous TID WC   insulin glargine-yfgn  32 Units Subcutaneous QHS   isosorbide mononitrate  30 mg Oral Daily   levothyroxine  137 mcg Oral QAC breakfast   metoprolol succinate  25 mg Oral BID   modafinil  200 mg Oral Daily   pravastatin  40 mg Oral QPM   predniSONE  40 mg Oral Q breakfast   rOPINIRole  4 mg Oral QHS   rOPINIRole  2 mg Oral BID   sodium chloride flush  3 mL Intravenous Q12H    Continuous Infusions:  sodium chloride     LOS: 0 days   Shon Hale M.D on 07/13/2022 at 8:06 AM  Go to [email or web address] - for contact info  [facility] - Office  [phone]  If 7PM-7AM, please contact night-coverage [email or web address] 07/13/2022, 8:06 AM

## 2022-07-13 NOTE — Plan of Care (Signed)
  Problem: Education: Goal: Ability to demonstrate management of disease process will improve Outcome: Progressing Goal: Ability to verbalize understanding of medication therapies will improve Outcome: Progressing Goal: Individualized Educational Video(s) Outcome: Progressing   Problem: Activity: Goal: Capacity to carry out activities will improve Outcome: Progressing   Problem: Cardiac: Goal: Ability to achieve and maintain adequate cardiopulmonary perfusion will improve Outcome: Progressing   Problem: Education: Goal: Ability to describe self-care measures that may prevent or decrease complications (Diabetes Survival Skills Education) will improve Outcome: Progressing Goal: Individualized Educational Video(s) Outcome: Progressing   

## 2022-07-13 NOTE — Progress Notes (Signed)
   07/13/22 1859  ReDS Vest / Clip  Station Marker B  Ruler Value 40  ReDS Value Range (!) > 40  ReDS Actual Value 54  Anatomical Comments attempted two times after the initial result and got low quality. (pt sitting upright)   Pt was sitting upright. Despite data given from reds clip pt states he is breathing a lot better than he was when he first arrived to unit.

## 2022-07-13 NOTE — Inpatient Diabetes Management (Signed)
Inpatient Diabetes Program Recommendations  AACE/ADA: New Consensus Statement on Inpatient Glycemic Control   Target Ranges:  Prepandial:   less than 140 mg/dL      Peak postprandial:   less than 180 mg/dL (1-2 hours)      Critically ill patients:  140 - 180 mg/dL    Latest Reference Range & Units 07/13/22 04:15 07/13/22 07:50  Glucose-Capillary 70 - 99 mg/dL 829 (H) 562 (H)    Latest Reference Range & Units 07/12/22 07:46 07/12/22 08:32 07/12/22 11:57 07/12/22 18:26 07/12/22 18:29 07/12/22 22:03  Glucose-Capillary 70 - 99 mg/dL 53 (L) 76 130 (H) 865 (HH) 466 (H) 496 (H)   Review of Glycemic Control  Diabetes history: DM2 Outpatient Diabetes medications: Toujeo 120 units at bedtime, Fiasp 30-38 units TID with meals, Ozempic 1 mg Qweek (Monday) Current orders for Inpatient glycemic control: Semglee 32 units at bedtime, Novolog 0-20 units TID with meals, Novolog 0-5 units at bedtime, Novolog 4 units TID with meals; Prednisone 40 mg QAM  Inpatient Diabetes Program Recommendations:    Insulin: If steroids are continued as ordered, please consider increasing Semglee to 40 units at bedtime and meal coverage to Novolog 8 units TID with meals.  Thanks, Orlando Penner, RN, MSN, CDCES Diabetes Coordinator Inpatient Diabetes Program [phone] (Team Pager from 8am to 5pm)

## 2022-07-14 ENCOUNTER — Ambulatory Visit: Payer: Medicare HMO | Admitting: Pulmonary Disease

## 2022-07-14 DIAGNOSIS — I5033 Acute on chronic diastolic (congestive) heart failure: Secondary | ICD-10-CM | POA: Diagnosis not present

## 2022-07-14 LAB — BASIC METABOLIC PANEL
Anion gap: 14 (ref 5–15)
BUN: 59 mg/dL — ABNORMAL HIGH (ref 8–23)
CO2: 23 mmol/L (ref 22–32)
Calcium: 9.5 mg/dL (ref 8.9–10.3)
Chloride: 92 mmol/L — ABNORMAL LOW (ref 98–111)
Creatinine, Ser: 1.48 mg/dL — ABNORMAL HIGH (ref 0.61–1.24)
GFR, Estimated: 50 mL/min — ABNORMAL LOW (ref >60)
Glucose, Bld: 218 mg/dL — ABNORMAL HIGH (ref 70–99)
Potassium: 4.1 mmol/L (ref 3.5–5.1)
Sodium: 129 mmol/L — ABNORMAL LOW (ref 135–145)

## 2022-07-14 LAB — GLUCOSE, CAPILLARY
Glucose-Capillary: 175 mg/dL — ABNORMAL HIGH (ref 70–99)
Glucose-Capillary: 388 mg/dL — ABNORMAL HIGH (ref 70–99)

## 2022-07-14 MED ORDER — FUROSEMIDE 40 MG PO TABS: 40.0000 mg | ORAL_TABLET | Freq: Two times a day (BID) | ORAL | 3 refills | Status: DC

## 2022-07-14 MED ORDER — ALBUTEROL SULFATE (2.5 MG/3ML) 0.083% IN NEBU: 2.5000 mg | INHALATION_SOLUTION | Freq: Four times a day (QID) | RESPIRATORY_TRACT | 3 refills | Status: AC | PRN

## 2022-07-14 MED ORDER — ALBUTEROL SULFATE HFA 108 (90 BASE) MCG/ACT IN AERS
2.0000 | INHALATION_SPRAY | Freq: Four times a day (QID) | RESPIRATORY_TRACT | 5 refills | Status: AC | PRN
Start: 2022-07-14 — End: ?

## 2022-07-14 MED ORDER — DM-GUAIFENESIN ER 30-600 MG PO TB12: 1.0000 | ORAL_TABLET | Freq: Two times a day (BID) | ORAL | 0 refills | Status: AC

## 2022-07-14 NOTE — Discharge Summary (Signed)
Paul Baker, is a 72 y.o. male  DOB 07/21/1950  MRN [id number].  Admission date:  07/11/2022  Admitting Physician  Shon Hale, MD  Discharge Date:  07/14/2022   Primary MD  [name], MD  Recommendations for primary care physician for things to follow:  1)Very low-salt diet advised- less than 2 g sodium diet advised 2)Weigh yourself daily, call if you gain more than 3 pounds in 1 day or more than 5 pounds in 1 week as your diuretic medications may need to be adjusted 3)Limit your Fluid  intake to no more than 60 ounces (1.8 Liters) per day 4)Avoid ibuprofen/Advil/Aleve/Motrin/Goody Powders/Naproxen/BC powders/Meloxicam/Diclofenac/Indomethacin and other Nonsteroidal anti-inflammatory medications as these will make you more likely to bleed and can cause stomach ulcers, can also cause Kidney problems.  5)Please follow-up with Dr. Diona Browner your cardiologist as scheduled next week please take along your daily weight chart record 6)Repeat BMP Blood Tests in about 5 to 7 days from Now...   Admission Diagnosis  Acute on chronic diastolic HF (heart failure) (HCC) [I50.33] Acute on chronic congestive heart failure, unspecified heart failure type (HCC) [I50.9] Acute exacerbation of CHF (congestive heart failure) (HCC) [I50.9]   Discharge Diagnosis  Acute on chronic diastolic HF (heart failure) (HCC) [I50.33] Acute on chronic congestive heart failure, unspecified heart failure type (HCC) [I50.9] Acute exacerbation of CHF (congestive heart failure) (HCC) [I50.9]    Principal Problem:   Acute on chronic diastolic HF (heart failure) (HCC) Active Problems:   Hyperglycemia due to type 2 diabetes mellitus (HCC)   Essential hypertension, benign   OSA on CPAP   Restless legs syndrome with familial myoclonus   Class 2 obesity   Paroxysmal atrial fibrillation (HCC)   Hypothyroidism   Interstitial pulmonary  disease (HCC)   Acute exacerbation of CHF (congestive heart failure) (HCC)      Past Medical History:  Diagnosis Date   Arthritis    Asthma    Atrial fibrillation [facility])    Diagnosed December 2021   Colon polyp    Coronary atherosclerosis of native coronary artery    a. 2011: cath showing 90% stenosis along small non-dominant RCA (too small for PCI). b. 01/2018: cath showing nonobstructive CAD with 60 to 70% proximal to mid nondominant RCA stenosis, 50% mid LAD and 40 to 50% OM1   DVT (deep venous thrombosis) (HCC) 2005   Right arm   Essential hypertension    Headache    History of transfusion    Hypothyroidism    Mixed hyperlipidemia    Morbid obesity (HCC)    MRSA (methicillin resistant staph aureus) culture positive    08/2012   Narcolepsy    OSA (obstructive sleep apnea)    CPAP   Osteoarthritis    Pneumonia    Psoriasis    Pulmonary embolism (HCC) 2004   RLS (restless legs syndrome)    Rotator cuff disorder    Left   Septic arthritis of knee, left (HCC)    Skin cancer, basal cell    Type  2 diabetes mellitus [facility])     Past Surgical History:  Procedure Laterality Date   BACK SURGERY     BIOPSY  01/31/2022   Procedure: BIOPSY;  Surgeon: Lynann Bologna, DO;  Location: [facility];  Service: Gastroenterology;;   CATARACT EXTRACTION Bilateral    COLONOSCOPY     COLONOSCOPY WITH PROPOFOL N/A 01/31/2022   Procedure: COLONOSCOPY WITH PROPOFOL;  Surgeon: Lynann Bologna, DO;  Location: [facility];  Service: Gastroenterology;  Laterality: N/A;   CYST REMOVAL TRUNK     from back   ESOPHAGOGASTRODUODENOSCOPY (EGD) WITH PROPOFOL N/A 01/31/2022   Procedure: ESOPHAGOGASTRODUODENOSCOPY (EGD) WITH PROPOFOL;  Surgeon: Lynann Bologna, DO;  Location: [facility];  Service: Gastroenterology;  Laterality: N/A;   EYE SURGERY Left 2016   laser to left eye   HIP SURGERY     bone removed from both sides of hip   KNEE ARTHROTOMY Right 12/04/2014   Procedure: KNEE ARTHROTOMY  PATELLA LIGAMENT RECONSTRUSION AND REPAIR RIGHT KNEE;  Surgeon: Durene Romans, MD;  Location: [facility] OR;  Service: Orthopedics;  Laterality: Right;   KNEE SURGERY     X 25 TIMES   LEFT HEART CATH AND CORONARY ANGIOGRAPHY N/A 01/17/2018   Procedure: LEFT HEART CATH AND CORONARY ANGIOGRAPHY;  Surgeon: Lyn Records, MD;  Location: [facility] INVASIVE CV LAB;  Service: Cardiovascular;  Laterality: N/A;   LUMBAR DISC SURGERY     Left L3, L4, L5 discecotomy with decompression of L4 root   POLYPECTOMY  01/31/2022   Procedure: POLYPECTOMY;  Surgeon: Lynann Bologna, DO;  Location: [facility];  Service: Gastroenterology;;   TONSILLECTOMY     TOTAL KNEE ARTHROPLASTY  2003   LEFT   TOTAL KNEE ARTHROPLASTY Right 03/23/2014   Procedure: RIGHT TOTAL KNEE ARTHROPLASTY AND REMOVAL RIGHT TIBIAL  DEEP IMPLANT STAPLE;  Surgeon: Durene Romans, MD;  Location: [facility] ORS;  Service: Orthopedics;  Laterality: Right;   TOTAL KNEE REVISION  2005   LEFT   WRIST SURGERY       HPI  from the history and physical done on the day of admission:    HPI: ISAMU GUNDY is a 72 y.o. male with medical history significant of obstructive sleep apnea on CPAP, interstitial lung disease, restless leg syndrome, uncontrolled type 2 diabetes with nephropathy (stage IIIa), hypertension, hypothyroidism, hyperlipidemia, class II obesity, diastolic heart failure and paroxysmal atrial fibrillation; who presented to the hospital secondary to shortness of breath, orthopnea and increased weight gain (over 10 pounds in the last week or so).  Patient reports symptoms has been gradually worsening and of the day of admission he was experiencing pleuritic chest discomfort for when coughing especially if laying down. No fever, no nausea, no vomiting, no dysuria or hematuria, no melena, no hematochezia, no focal weaknesses or sick contacts. Patient reports no hemoptysis, but noticing intermittent productive coughing spells with yellow sputum.   EKG without  acute ischemic changes; troponin negative.  Chest x-ray with bronchitis changes no other frank abnormalities or infiltrates.   TRH has been consulted to place patient in the hospital for further evaluation and management of what appears to be acute exacerbation interstitial lung disease and diastolic heart failure.   Review of Systems: As mentioned in the history of present illness. All other systems reviewed and are negative.     Hospital Course:   Brief Narrative:  72 y.o. male with medical history significant of obstructive sleep apnea on CPAP, interstitial lung disease, restless leg syndrome, uncontrolled type 2 diabetes  with nephropathy (stage IIIa), hypertension, hypothyroidism, hyperlipidemia, class II obesity, diastolic heart failure and paroxysmal atrial fibrillation admitted on 07/11/2022 with greater than 10 pound weight gain and significant dyspnea as well as dyspnea on exertion consistent with acute on chronic diastolic dysfunction CHF     -Assessment and Plan: *1)Acute on chronic diastolic HF (heart failure) (HCC) --Baseline weight usually 215  to 217 lb -Admitted with a weight of 230 pounds -BNP not elevated but could be misleading in the setting of obesity -Mild bronchitic changes appreciated on x-ray. -Diuresed well with IV Lasix -Recent echo demonstrating preserved ejection fraction and grade 1 diastolic dysfunction. Neg fluid balance- -Wt 230.82 >>227 >>222.6>>219 -No further dyspnea at rest, no orthopnea and dyspnea on exertion has resolved  -Increase compliance with salt restriction advised -Okay to discharge home on Lasix 40 twice daily with outpatient follow-up with his cardiologist Dr. Diona Browner   2)Interstitial pulmonary disease [facility]) -Given bronchitic changes, complains of productive coughing spells (yellow sputum), he might be experiencing mild exacerbation. -No hypoxia -Respiratory status improved significantly with prednisone,  antibiotics and bronchodilators  and bronchodilators, no further steroids at this time   Hypothyroidism -TSH 2.27, - continue levothyroxine   Paroxysmal atrial fibrillation (HCC) -Stable, metoprolol for rate control and Eliquis for stroke prophylaxis   Class 2 obesity -Low calorie diet, portion control and increase physical activity discussed with patient -Body mass index is 38.22 kg/m.   Restless legs syndrome with familial myoclonus -Continue treatment with the use of Requip as previously prescribed.   OSA on CPAP -Continue the use of CPAP nightly.   Essential hypertension, benign -Continue metoprolol and Imdur -Continue Cozaar    Hyperglycemia due to type 2 diabetes mellitus (HCC) -A1c has been 8.9 each time is being checked over the last 8 months or so -Patient's insulin regimen was recently changed just about a month ago -Sugars may be more erratic given prednisone use needs admission -diabetic educator input appreciated  -Semglee dose adjusted -Resume PTA diabetic regimen and follow-up with PCP/endocrinologist for further adjustments   -Aki on CKD stage -3A    -Creatinine was 1.3 range on admission which is not far from baseline, Creatinine is Now up to 1.40 with diuresis--- -repeat BMP in the next 5 to 7 days advised - renally adjust medications, avoid nephrotoxic agents / dehydration  / hypotension   Disposition: The patient is from: Home              Anticipated d/c is to: Home  Discharge Condition: stable  Follow UP   Follow-up Information     Jonelle Sidle, MD. Schedule an appointment as soon as possible for a visit in 1 week(s).   Specialty: Cardiology Contact information: [address] [place] [address] [phone]                 Diet and Activity recommendation:  As advised  Discharge Instructions    Discharge Instructions     Call MD for:  difficulty breathing, headache or visual disturbances   Complete by: As directed    Call MD for:  persistant  dizziness or light-headedness   Complete by: As directed    Call MD for:  persistant nausea and vomiting   Complete by: As directed    Call MD for:  temperature >100.4   Complete by: As directed    Diet - low sodium heart healthy   Complete by: As directed    Diet Carb Modified   Complete  by: As directed    Discharge instructions   Complete by: As directed    1)Very low-salt diet advised- less than 2 g sodium diet advised 2)Weigh yourself daily, call if you gain more than 3 pounds in 1 day or more than 5 pounds in 1 week as your diuretic medications may need to be adjusted 3)Limit your Fluid  intake to no more than 60 ounces (1.8 Liters) per day 4)Avoid ibuprofen/Advil/Aleve/Motrin/Goody Powders/Naproxen/BC powders/Meloxicam/Diclofenac/Indomethacin and other Nonsteroidal anti-inflammatory medications as these will make you more likely to bleed and can cause stomach ulcers, can also cause Kidney problems.  5)Please follow-up with Dr. Diona Browner your cardiologist as scheduled next week please take along your daily weight chart record 6)Repeat BMP Blood Tests in about 5 to 7 days from Now...   Increase activity slowly   Complete by: As directed        Discharge Medications     Allergies as of 07/14/2022       Reactions   Tape Rash   Pulls off skin   Farxiga [dapagliflozin]    Yeast infections, felt sick with it   Cardizem [diltiazem Hcl]    Edema    Cardura [doxazosin Mesylate]    Headaches / cramps   Crestor [rosuvastatin] Other (See Comments)   Leg cramps   Lipitor [atorvastatin] Other (See Comments)   Leg cramps   Paxil [paroxetine Hcl]    Unknown reaction    Codeine Rash, Other (See Comments)   Headache    Gabapentin Rash        Medication List     TAKE these medications    acetaminophen 325 MG tablet Commonly known as: TYLENOL Take 2 tablets (650 mg total) by mouth every 6 (six) hours as needed for mild pain (or Fever >/= 101). What changed: how much to take    albuterol (2.5 MG/3ML) 0.083% nebulizer solution Commonly known as: PROVENTIL Take 3 mLs (2.5 mg total) by nebulization every 6 (six) hours as needed for up to 7 days for wheezing or shortness of breath.   albuterol 108 (90 Base) MCG/ACT inhaler Commonly known as: VENTOLIN HFA Inhale 2 puffs into the lungs every 6 (six) hours as needed for wheezing or shortness of breath.   alfuzosin 10 MG 24 hr tablet Commonly known as: UROXATRAL Take 10 mg by mouth daily.   Alpha Lipoic Acid-Biotin ER 600-450 MG-MCG Tbcr Take 600 mg by mouth every morning. What changed: when to take this   apixaban 5 MG Tabs tablet Commonly known as: ELIQUIS Take 1 tablet (5 mg total) by mouth 2 (two) times daily. For stroke prevention   B-D ULTRAFINE III SHORT PEN 31G X 8 MM Misc Generic drug: Insulin Pen Needle USE 1 PEN NEEDLE THREE TIMES DAILY   clobetasol cream 0.05 % Commonly known as: TEMOVATE Apply thin amount 2 times daily prn to psoriasis not for face axilla or groin   Dexcom G7 Sensor Misc Change sensor every 10 days   dextromethorphan-guaiFENesin 30-600 MG 12hr tablet Commonly known as: MUCINEX DM Take 1 tablet by mouth 2 (two) times daily.   ezetimibe 10 MG tablet Commonly known as: ZETIA Take 1 tablet by mouth once daily   ferrous sulfate 325 (65 FE) MG EC tablet Take 1 tablet (325 mg total) by mouth daily with breakfast. What changed: when to take this   Fiasp FlexTouch 100 UNIT/ML FlexTouch Pen Generic drug: insulin aspart Inject 30 Units into the skin in the morning, at noon, and at bedtime.  30-38 units per sliding scale   fluticasone 50 MCG/ACT nasal spray Commonly known as: FLONASE Place 1 spray into both nostrils daily.   furosemide 40 MG tablet Commonly known as: Lasix Take 1 tablet (40 mg total) by mouth 2 (two) times daily. What changed:  how much to take when to take this additional instructions   Gvoke HypoPen 2-Pack 1 MG/0.2ML Soaj Generic drug:  Glucagon Inject 1 mg subcutaneously once as needed for low blood sugar. May repeat dose in 15 minutes as needed using a new device.   isosorbide mononitrate 30 MG 24 hr tablet Commonly known as: IMDUR Take 1 tablet (30 mg total) by mouth daily. What changed:  how much to take additional instructions   levothyroxine 137 MCG tablet Commonly known as: SYNTHROID TAKE 1 TABLET BY MOUTH ONCE DAILY BEFORE BREAKFAST   loratadine 10 MG tablet Commonly known as: CLARITIN Take 10 mg by mouth daily as needed for allergies.   losartan 50 MG tablet Commonly known as: COZAAR Take 50 mg by mouth daily.   metoprolol succinate 25 MG 24 hr tablet Commonly known as: Toprol XL Take 1 tablet (25 mg total) by mouth 2 (two) times daily.   modafinil 200 MG tablet Commonly known as: PROVIGIL Take 1 tablet by mouth once daily   nitroGLYCERIN 0.4 MG SL tablet Commonly known as: NITROSTAT DISSOLVE 1 TABLET UNDER TONGUE EVERY 5 MINUTES UP TO 15 MIN FOR CHESTPAIN. IF NO RELIEF CALL 911.   ONE TOUCH ULTRA TEST test strip Generic drug: glucose blood TEST BLOOD SUGAR UP TO 4 TIMES DAILY.   OneTouch Delica Lancets 33G Misc USE AS DIRECTED TO TEST BLOOD SUGAR 4 TIMES DAILY.   pravastatin 40 MG tablet Commonly known as: PRAVACHOL TAKE 1 TABLET BY MOUTH ONCE DAILY IN THE EVENING   rOPINIRole 4 MG tablet Commonly known as: REQUIP Take 0.5 tablets (2 mg total) by mouth 2 (two) times daily.   rOPINIRole 4 MG 24 hr tablet Commonly known as: REQUIP XL Take 1 tablet (4 mg total) by mouth at bedtime.   Semaglutide (1 MG/DOSE) 4 MG/3ML Sopn Inject 1 mg as directed once a week.   Toujeo Max SoloStar 300 UNIT/ML Solostar Pen Generic drug: insulin glargine (2 Unit Dial) Inject 120 Units into the skin at bedtime.   Trelegy Ellipta 100-62.5-25 MCG/ACT Aepb Generic drug: Fluticasone-Umeclidin-Vilant Inhale 1 puff into the lungs daily.        Major procedures and Radiology Reports - PLEASE review  detailed and final reports for all details, in brief -   CT Angio Chest PE W and/or Wo Contrast  Result Date: 07/11/2022 CLINICAL DATA:  Pulmonary embolism (PE) suspected, high prob; Epigastric pain EXAM: CT ANGIOGRAPHY CHEST CT ABDOMEN AND PELVIS WITH CONTRAST TECHNIQUE: Multidetector CT imaging of the chest was performed using the standard protocol during bolus administration of intravenous contrast. Multiplanar CT image reconstructions and MIPs were obtained to evaluate the vascular anatomy. Multidetector CT imaging of the abdomen and pelvis was performed using the standard protocol during bolus administration of intravenous contrast. RADIATION DOSE REDUCTION: This exam was performed according to the departmental dose-optimization program which includes automated exposure control, adjustment of the mA and/or kV according to patient size and/or use of iterative reconstruction technique. CONTRAST:  OMNIPAQUE IOHEXOL 350 MG/ML SOLN COMPARISON:  07/08/2022 CT, 03/11/2021 CT FINDINGS: CTA CHEST FINDINGS Cardiovascular: Satisfactory opacification of the pulmonary arteries to the segmental level. No evidence of pulmonary embolism. Thoracic aorta is nonaneurysmal. Atherosclerotic calcifications of the  aorta and coronary arteries. Upper normal heart size. No pericardial effusion. Mediastinum/Nodes: No enlarged mediastinal, hilar, or axillary lymph nodes. Thyroid gland, trachea, and esophagus demonstrate no significant findings. Lungs/Pleura: Trace left pleural effusion. Mild ground-glass attenuation with interlobular septal thickening, more pronounced within the perihilar regions and upper lobes bilaterally. No pneumothorax. Musculoskeletal: No chest wall abnormality. No acute or significant osseous findings. Anterior endplate spurring within the mid to lower thoracic spine. Review of the MIP images confirms the above findings. CT ABDOMEN and PELVIS FINDINGS Hepatobiliary: No focal liver abnormality is seen. No  gallstones, gallbladder wall thickening, or biliary dilatation. Pancreas: Unremarkable. No pancreatic ductal dilatation or surrounding inflammatory changes. Spleen: Normal in size without focal abnormality. Adrenals/Urinary Tract: Unremarkable adrenal glands. Kidneys enhance symmetrically without focal lesion, stone, or hydronephrosis. Ureters are nondilated. Urinary bladder appears unremarkable for the degree of distention. Stomach/Bowel: Stomach is within normal limits. Appendix appears normal (series 2, image 34). No evidence of bowel wall thickening, distention, or inflammatory changes. Vascular/Lymphatic: Aortic atherosclerosis. No enlarged abdominal or pelvic lymph nodes. Reproductive: Prostatomegaly. Other: No free fluid. No abdominopelvic fluid collection. No pneumoperitoneum. No abdominal wall hernia. Musculoskeletal: No acute or significant osseous findings. Unchanged soft tissue thickening along the lower anterior abdominal wall, likely reflecting injection-related changes. Review of the MIP images confirms the above findings. IMPRESSION: 1. No evidence of pulmonary embolism. 2. Mild ground-glass attenuation with interlobular septal thickening, more pronounced within the perihilar regions and upper lobes bilaterally. Findings are favored to represent mild pulmonary edema. 3. Trace left pleural effusion. 4. No acute abdominopelvic findings. 5. Prostatomegaly. 6. Aortic and coronary artery atherosclerosis (ICD10-I70.0). Electronically Signed   By: Duanne Guess D.O.   On: 07/11/2022 12:31   CT ABDOMEN PELVIS W CONTRAST  Result Date: 07/11/2022 CLINICAL DATA:  Pulmonary embolism (PE) suspected, high prob; Epigastric pain EXAM: CT ANGIOGRAPHY CHEST CT ABDOMEN AND PELVIS WITH CONTRAST TECHNIQUE: Multidetector CT imaging of the chest was performed using the standard protocol during bolus administration of intravenous contrast. Multiplanar CT image reconstructions and MIPs were obtained to evaluate the  vascular anatomy. Multidetector CT imaging of the abdomen and pelvis was performed using the standard protocol during bolus administration of intravenous contrast. RADIATION DOSE REDUCTION: This exam was performed according to the departmental dose-optimization program which includes automated exposure control, adjustment of the mA and/or kV according to patient size and/or use of iterative reconstruction technique. CONTRAST:  OMNIPAQUE IOHEXOL 350 MG/ML SOLN COMPARISON:  07/08/2022 CT, 03/11/2021 CT FINDINGS: CTA CHEST FINDINGS Cardiovascular: Satisfactory opacification of the pulmonary arteries to the segmental level. No evidence of pulmonary embolism. Thoracic aorta is nonaneurysmal. Atherosclerotic calcifications of the aorta and coronary arteries. Upper normal heart size. No pericardial effusion. Mediastinum/Nodes: No enlarged mediastinal, hilar, or axillary lymph nodes. Thyroid gland, trachea, and esophagus demonstrate no significant findings. Lungs/Pleura: Trace left pleural effusion. Mild ground-glass attenuation with interlobular septal thickening, more pronounced within the perihilar regions and upper lobes bilaterally. No pneumothorax. Musculoskeletal: No chest wall abnormality. No acute or significant osseous findings. Anterior endplate spurring within the mid to lower thoracic spine. Review of the MIP images confirms the above findings. CT ABDOMEN and PELVIS FINDINGS Hepatobiliary: No focal liver abnormality is seen. No gallstones, gallbladder wall thickening, or biliary dilatation. Pancreas: Unremarkable. No pancreatic ductal dilatation or surrounding inflammatory changes. Spleen: Normal in size without focal abnormality. Adrenals/Urinary Tract: Unremarkable adrenal glands. Kidneys enhance symmetrically without focal lesion, stone, or hydronephrosis. Ureters are nondilated. Urinary bladder appears unremarkable for the degree of distention. Stomach/Bowel:  Stomach is within normal limits. Appendix  appears normal (series 2, image 34). No evidence of bowel wall thickening, distention, or inflammatory changes. Vascular/Lymphatic: Aortic atherosclerosis. No enlarged abdominal or pelvic lymph nodes. Reproductive: Prostatomegaly. Other: No free fluid. No abdominopelvic fluid collection. No pneumoperitoneum. No abdominal wall hernia. Musculoskeletal: No acute or significant osseous findings. Unchanged soft tissue thickening along the lower anterior abdominal wall, likely reflecting injection-related changes. Review of the MIP images confirms the above findings. IMPRESSION: 1. No evidence of pulmonary embolism. 2. Mild ground-glass attenuation with interlobular septal thickening, more pronounced within the perihilar regions and upper lobes bilaterally. Findings are favored to represent mild pulmonary edema. 3. Trace left pleural effusion. 4. No acute abdominopelvic findings. 5. Prostatomegaly. 6. Aortic and coronary artery atherosclerosis (ICD10-I70.0). Electronically Signed   By: Duanne Guess D.O.   On: 07/11/2022 12:31   DG Chest Portable 1 View  Result Date: 07/11/2022 CLINICAL DATA:  dyspnea EXAM: PORTABLE CHEST 1 VIEW COMPARISON:  CXR 06/06/22 FINDINGS: No pleural effusion. No pneumothorax. Unchanged cardiac and mediastinal contours. Low lung volumes. Hazy bibasilar airspace opacities are favored to represent atelectasis. No radiographically apparent displaced rib fractures. Visualized upper abdomen is unremarkable. IMPRESSION: Low lung volumes with bibasilar atelectasis Electronically Signed   By: Lorenza Cambridge M.D.   On: 07/11/2022 11:28    Today   Subjective    [name] today has no new complaints - Continues to void well -No further dyspnea at rest, no significant dyspnea on exertion -Cough improved significantly   Patient has been seen and examined prior to discharge   Objective   Blood pressure (!) 148/88, pulse 72, temperature (!) 97.5 F (36.4 C), temperature source Oral,  resp. rate 16, height 5\' 4"  (1.626 m), weight 99.5 kg, SpO2 98%.   Intake/Output Summary (Last 24 hours) at 07/14/2022 1124 Last data filed at 07/14/2022 0500 Gross per 24 hour  Intake --  Output 2150 ml  Net -2150 ml   Exam Gen:- Awake Alert, no acute distress, speaking in full sentences HEENT:- [place].AT, No sclera icterus Neck-Supple Neck,No JVD,.  Lungs-improved air movement, no wheezing  CV- S1, S2 normal, irregular Abd-  +ve B.Sounds, Abd Soft, No tenderness, increased truncal adiposity Extremity/Skin:-Resolved edema,   good pulses Psych-affect is appropriate, oriented x3 Neuro-no new focal deficits, no tremors    Data Review   CBC w Diff:  Lab Results  Component Value Date   WBC 8.9 07/11/2022   HGB 9.8 (L) 07/11/2022   HGB 11.9 (L) 10/17/2021   HCT 30.5 (L) 07/11/2022   HCT 35.7 (L) 10/17/2021   PLT 249 07/11/2022   PLT 202 10/17/2021   LYMPHOPCT 12 07/11/2022   MONOPCT 13 07/11/2022   EOSPCT 2 07/11/2022   BASOPCT 1 07/11/2022    CMP:  Lab Results  Component Value Date   NA 129 (L) 07/14/2022   NA 134 04/13/2022   K 4.1 07/14/2022   CL 92 (L) 07/14/2022   CO2 23 07/14/2022   BUN 59 (H) 07/14/2022   BUN 42 (H) 04/13/2022   CREATININE 1.48 (H) 07/14/2022   CREATININE 1.11 02/06/2019   GLU 207 11/12/2020   PROT 7.1 07/11/2022   PROT 6.7 05/05/2022   ALBUMIN 3.6 07/11/2022   ALBUMIN 4.3 05/05/2022   BILITOT 0.8 07/11/2022   BILITOT <0.2 05/05/2022   ALKPHOS 57 07/11/2022   AST 14 (L) 07/11/2022   ALT 16 07/11/2022  .  Total Discharge time is about 33 minutes  Shon Hale M.D on 07/14/2022  at 11:24 AM  Go to [email or web address] -  for contact info  [facility] - Office  [phone]

## 2022-07-14 NOTE — Care Management Important Message (Signed)
Important Message  Patient Details  Name: Paul Baker MRN: [id number] Date of Birth: June 30, 1950   Medicare Important Message Given:  N/A - LOS <3 / Initial given by admissions     Corey Harold 07/14/2022, 11:42 AM

## 2022-07-14 NOTE — Discharge Instructions (Addendum)
1)Very low-salt diet advised- less than 2 g sodium diet advised 2)Weigh yourself daily, call if you gain more than 3 pounds in 1 day or more than 5 pounds in 1 week as your diuretic medications may need to be adjusted 3)Limit your Fluid  intake to no more than 60 ounces (1.8 Liters) per day 4)Avoid ibuprofen/Advil/Aleve/Motrin/Goody Powders/Naproxen/BC powders/Meloxicam/Diclofenac/Indomethacin and other Nonsteroidal anti-inflammatory medications as these will make you more likely to bleed and can cause stomach ulcers, can also cause Kidney problems.  5)Please follow-up with Dr. Diona Browner your cardiologist as scheduled next week please take along your daily weight chart record 6)Repeat BMP Blood Tests in about 5 to 7 days from Now.Marland KitchenMarland Kitchen

## 2022-07-17 ENCOUNTER — Ambulatory Visit: Payer: Self-pay | Admitting: *Deleted

## 2022-07-17 ENCOUNTER — Telehealth: Payer: Self-pay

## 2022-07-17 ENCOUNTER — Other Ambulatory Visit: Payer: Self-pay | Admitting: Nurse Practitioner

## 2022-07-17 NOTE — Transitions of Care (Post Inpatient/ED Visit) (Signed)
07/17/2022  Name: Paul Baker MRN: [id number] DOB: [birth year]  Today's TOC FU Call Status: Today's TOC FU Call Status:: Successful TOC FU Call Competed TOC FU Call Complete Date: 07/17/22  Transition Care Management Follow-up Telephone Call Date of Discharge: 07/14/22 [facility] Type of Discharge: Inpatient Admission Primary Inpatient Discharge Diagnosis:: Acute on Chronic Diastolic Heart Failure How have you been since you were released from the hospital?: Better (Patient notes he is about 50% of his normal) Any questions or concerns?: Yes Patient Questions/Concerns:: Patient was suppose to see his cardiologist in one week and could not get an appointment until 08/10/22. Patient Questions/Concerns Addressed: Notified Provider of Patient Questions/Concerns  Items Reviewed: Did you receive and understand the discharge instructions provided?: Yes Medications obtained,verified, and reconciled?: Yes (Medications Reviewed) Any new allergies since your discharge?: No Dietary orders reviewed?: Yes Type of Diet Ordered:: Low sodium, heart healthy, daily weights Do you have support at home?: Yes Name of Support/Comfort Primary Source: [place]  Medications Reviewed Today: Medications Reviewed Today     Reviewed by Jodelle Gross, RN (Case Manager) on 07/17/22 at 1305  Med List Status: <None>   Medication Order Taking? Sig Documenting Provider Last Dose Status Informant  acetaminophen (TYLENOL) 325 MG tablet [id number] Yes Take 2 tablets (650 mg total) by mouth every 6 (six) hours as needed for mild pain (or Fever >/= 101).  Patient taking differently: Take 1,000 mg by mouth every 6 (six) hours as needed for mild pain (or Fever >/= 101).   Shon Hale, MD Taking Active Self  albuterol (PROVENTIL) (2.5 MG/3ML) 0.083% nebulizer solution [id number] Yes Take 3 mLs (2.5 mg total) by nebulization every 6 (six) hours as needed for up to 7 days for wheezing or  shortness of breath. Shon Hale, MD Taking Active   albuterol (VENTOLIN HFA) 108 (90 Base) MCG/ACT inhaler [id number] Yes Inhale 2 puffs into the lungs every 6 (six) hours as needed for wheezing or shortness of breath. Shon Hale, MD Taking Active   alfuzosin (UROXATRAL) 10 MG 24 hr tablet [id number] Yes Take 10 mg by mouth daily. [provider] Taking Active Self  Alpha Lipoic Acid-Biotin ER 600-450 MG-MCG TBCR [id number] Yes Take 600 mg by mouth every morning.  Patient taking differently: Take 600 mg by mouth at bedtime.   Dohmeier, Porfirio Mylar, MD Taking Active Self  apixaban (ELIQUIS) 5 MG TABS tablet [id number] Yes Take 1 tablet (5 mg total) by mouth 2 (two) times daily. For stroke prevention Sharlene Dory, NP Taking Active Self  clobetasol cream (TEMOVATE) 0.05 % [id number]  Apply thin amount 2 times daily prn to psoriasis not for face axilla or groin [name], MD  Active Self  Continuous Glucose Sensor (DEXCOM G7 SENSOR) MISC [id number] Yes Change sensor every 10 days Dani Gobble, NP Taking Active Self  dextromethorphan-guaiFENesin (MUCINEX DM) 30-600 MG 12hr tablet [id number] Yes Take 1 tablet by mouth 2 (two) times daily. Shon Hale, MD Taking Active   ezetimibe (ZETIA) 10 MG tablet [id number] Yes Take 1 tablet by mouth once daily Luking, Jonna Coup, MD Taking Active   ferrous sulfate 325 (65 FE) MG EC tablet [id number] Yes Take 1 tablet (325 mg total) by mouth daily with breakfast.  Patient taking differently: Take 325 mg by mouth at bedtime.   Briant Cedar, PA-C Taking Active Self  FIASP FLEXTOUCH 100 UNIT/ML FlexTouch Pen [id number] Yes Inject 30 Units into the skin in the morning, at noon, and at bedtime. 30-38  units per sliding scale [provider] Taking Active Self           Med Note Magnus Ivan   Tue Jul 11, 2022  3:16 PM) Injected 34 units this morning around 0700  fluticasone (FLONASE) 50 MCG/ACT nasal spray [id number] Yes Place 1 spray  into both nostrils daily. Coralyn Helling, MD Taking Active Self  Fluticasone-Umeclidin-Vilant (TRELEGY ELLIPTA) 100-62.5-25 MCG/ACT AEPB [id number] Yes Inhale 1 puff into the lungs daily. Coralyn Helling, MD Taking Active Self  furosemide (LASIX) 40 MG tablet [id number] Yes Take 1 tablet (40 mg total) by mouth 2 (two) times daily. Shon Hale, MD Taking Active   Glucagon (GVOKE HYPOPEN 2-PACK) 1 MG/0.2ML SOAJ [id number]  Inject 1 mg subcutaneously once as needed for low blood sugar. May repeat dose in 15 minutes as needed using a new device. [name], MD  Active Self  insulin glargine, 2 Unit Dial, (TOUJEO MAX SOLOSTAR) 300 UNIT/ML Solostar Pen [id number] Yes Inject 120 Units into the skin at bedtime. Dani Gobble, NP Taking Active Self           Med Note Donn Pierini Jun 01, 2022  8:12 AM)    Insulin Pen Needle (B-D ULTRAFINE III SHORT PEN) 31G X 8 MM MISC [id number] Yes USE 1 PEN NEEDLE THREE TIMES DAILY Dani Gobble, NP Taking Active Self  isosorbide mononitrate (IMDUR) 30 MG 24 hr tablet [id number] Yes Take 1 tablet (30 mg total) by mouth daily.  Patient taking differently: Take 15 mg by mouth daily. 15mg  AM and 15 PM   Sharlene Dory, NP Taking Active Self  levothyroxine (SYNTHROID) 137 MCG tablet [id number] Yes TAKE 1 TABLET BY MOUTH ONCE DAILY BEFORE BREAKFAST. PLEASE KEEP YOUR APPOINTMENT ON 04/03/2022. Dani Gobble, NP Taking Active   loratadine (CLARITIN) 10 MG tablet [id number] Yes Take 10 mg by mouth daily as needed for allergies.  [provider] Taking Active Self           Med Note [facility] MENDEZ, Christiana Pellant   Tue Jul 01, 2019  4:07 PM)    losartan (COZAAR) 50 MG tablet [id number] Yes Take 50 mg by mouth daily. [provider] Taking Active Self  metoprolol succinate (TOPROL XL) 25 MG 24 hr tablet [id number] Yes Take 1 tablet (25 mg total) by mouth 2 (two) times daily. Sharlene Dory, NP Taking Active Self  modafinil (PROVIGIL) 200 MG tablet  [id number] Yes Take 1 tablet by mouth once daily Dohmeier, Porfirio Mylar, MD Taking Active Self  nitroGLYCERIN (NITROSTAT) 0.4 MG SL tablet [id number]  DISSOLVE 1 TABLET UNDER TONGUE EVERY 5 MINUTES UP TO 15 MIN FOR CHESTPAIN. IF NO RELIEF CALL 911. Sharlene Dory, NP  Active Self  ONE TOUCH ULTRA TEST test strip [id number]  TEST BLOOD SUGAR UP TO 4 TIMES DAILY. Roma Kayser, MD  Active Self  [facility] DELICA LANCETS 33G MISC [id number] Yes USE AS DIRECTED TO TEST BLOOD SUGAR 4 TIMES DAILY. Roma Kayser, MD Taking Active Self  pravastatin (PRAVACHOL) 40 MG tablet [id number] Yes TAKE 1 TABLET BY MOUTH ONCE DAILY IN THE Essie Hart, MD Taking Active Self  rOPINIRole (REQUIP XL) 4 MG 24 hr tablet [id number] Yes Take 1 tablet (4 mg total) by mouth at bedtime. Dohmeier, Porfirio Mylar, MD Taking Active Self  rOPINIRole (REQUIP) 4 MG tablet [id number] Yes Take 0.5 tablets (2 mg total) by mouth 2 (two) times daily. Dohmeier, Porfirio Mylar, MD Taking Active Self  Semaglutide, 1 MG/DOSE,  4 MG/3ML SOPN [id number] Yes Inject 1 mg as directed once a week. Dani Gobble, NP Taking Active Self           Med Note Donn Pierini Jun 01, 2022  8:13 AM) Via [employer] patient assistance program              Home Care and Equipment/Supplies: Were Home Health Services Ordered?: No Any new equipment or medical supplies ordered?: No  Functional Questionnaire: Do you need assistance with bathing/showering or dressing?: No Do you need assistance with meal preparation?: No Do you need assistance with eating?: No Do you have difficulty maintaining continence: No Do you need assistance with getting out of bed/getting out of a chair/moving?: No Do you have difficulty managing or taking your medications?: No  Follow up appointments reviewed: PCP Follow-up appointment confirmed?: NA Specialist Hospital Follow-up appointment confirmed?: Yes Date of Specialist follow-up appointment?:  08/10/22 Follow-Up Specialty Provider:: Sharlene Dory (cardiology) Do you need transportation to your follow-up appointment?: No Do you understand care options if your condition(s) worsen?: Yes-patient verbalized understanding  SDOH Interventions Today    Flowsheet Row Most Recent Value  SDOH Interventions   Transportation Interventions Intervention Not Indicated  Utilities Interventions Intervention Not Indicated      Interventions Today    Flowsheet Row Most Recent Value  Chronic Disease   Chronic disease during today's visit Congestive Heart Failure (CHF), Diabetes  General Interventions   General Interventions Discussed/Reviewed General Interventions Discussed, Referral to Nurse       [facility] Interventions Today    Flowsheet Row Most Recent Value  TOC Interventions   TOC Interventions Discussed/Reviewed TOC Interventions Discussed, TOC Interventions Reviewed, Contacted provider for patient needs      Jodelle Gross, RN, BSN, CCM Care Management Coordinator /[facility]

## 2022-07-17 NOTE — Chronic Care Management (AMB) (Signed)
   07/17/2022  Paul Baker 09/21/1950 [id number]   Patient not currently participating in chronic care management services (CCM), status changed to previously enrolled.  Irving Shows [facility], BSN RN Case Manager [phone]

## 2022-07-18 NOTE — Telephone Encounter (Signed)
Dr. Craige Cotta, please advise on CT Chest results, thanks!

## 2022-07-23 DIAGNOSIS — E1165 Type 2 diabetes mellitus with hyperglycemia: Secondary | ICD-10-CM | POA: Diagnosis not present

## 2022-07-23 NOTE — Progress Notes (Unsigned)
Cardiology Office Note  Date: 07/24/2022   ID: BRONSYN SHAPPELL, DOB [birth year], MRN [id number]  History of Present Illness: Paul Baker is a 72 y.o. male last seen in June by Ms. Philis Nettle NP, I reviewed the note.  He presents now for a posthospital follow-up, I reviewed records.  Recently admitted to [facility] with acute on chronic diastolic heart failure managed by the hospitalist service, cardiology was not consulted.  He improved clinically with IV diuretics and was discharged on Lasix 40 mg twice daily.  His weight had been up about 10 pounds.  He presents today reporting improvement in symptoms and stable weight at home (not reflected in our weight today in clinic).  He did not tolerate [place] previously.  Currently on Ozempic as part of his diabetic regimen.  He follows his weights daily and is consistent otherwise with his other medications.  He plans to resume a regular exercise plan.  Does not report any chest discomfort following improvement in fluid status.  I reviewed his recent lab work and follow-up echocardiogram.  Physical Exam: VS:  BP 138/74 (BP Location: Right Arm)   Pulse 73   Ht 5\' 5"  (1.651 m)   Wt 225 lb 3.2 oz (102.2 kg)   SpO2 99%   BMI 37.48 kg/m , BMI Body mass index is 37.48 kg/m.  Wt Readings from Last 3 Encounters:  07/24/22 225 lb 3.2 oz (102.2 kg)  07/14/22 219 lb 5.7 oz (99.5 kg)  07/11/22 231 lb (104.8 kg)    General: Patient appears comfortable at rest. HEENT: Conjunctiva and lids normal. Lungs: Clear to auscultation, nonlabored breathing at rest. Cardiac: Regular rate and rhythm, no S3 or significant systolic murmur. Extremities: No pitting edema.  ECG:  An ECG dated 07/11/2022 was personally reviewed today and demonstrated:  Sinus rhythm, rule out old inferior infarct pattern.  Labwork: 07/11/2022: ALT 16; AST 14; B Natriuretic Peptide 81.0; Hemoglobin 9.8; Magnesium 2.2; Platelets 249; TSH 2.370 07/14/2022: BUN 59; Creatinine, Ser  1.48; Potassium 4.1; Sodium 129     Component Value Date/Time   CHOL 150 05/05/2022 0938   TRIG 206 (H) 05/05/2022 0938   HDL 43 05/05/2022 0938   CHOLHDL 3.5 05/05/2022 0938   CHOLHDL 3.2 08/05/2019 0334   VLDL 19 08/05/2019 0334   LDLCALC 73 05/05/2022 0938   LDLCALC 118 (H) 02/06/2019 0813   Other Studies Reviewed Today:  Coronary CTA 04/11/2022: IMPRESSION: 1. Coronary calcium score of 569. This was 75th percentile for age, sex, and race matched control.   2. Normal coronary origin with left dominance.   3. CAD-RADS 3. Moderate stenosis. Consider symptom-guided anti-ischemic pharmacotherapy as well as risk factor modification per guideline directed care. Additional analysis with CT FFR will be submitted.  Coronary CT FFR 04/11/2022: 1. Left Main: No significant functional stenosis.   2. LAD: No significant functional stenosis, CT-FFR 0.96 at mid LAD lesions. 3. LCX: No significant functional stenosis, CT-FFR 0.96 mid LCX. 4. RCA: No significant functional stenosis, CT-FFR 0.94 proximal-mid RCA, not modeled distally (small vessel) 5. RI: No significant functional stenosis.   IMPRESSION: 1. CT FFR analysis shows no evidence of significant functional stenosis.  Echocardiogram 05/03/2022:  1. Left ventricular ejection fraction, by estimation, is 60 to 65%. The  left ventricle has normal function. The left ventricle has no regional  wall motion abnormalities. There is mild left ventricular hypertrophy.  Left ventricular diastolic parameters  are consistent with Grade I diastolic dysfunction (impaired relaxation).  The average  left ventricular global longitudinal strain is -20.4 %. The  global longitudinal strain is normal.   2. Right ventricular systolic function is normal. The right ventricular  size is normal. Tricuspid regurgitation signal is inadequate for assessing  PA pressure.   3. Left atrial size was mildly dilated.   4. A small pericardial effusion is present.  The pericardial effusion is  posterior to the left ventricle.   5. The mitral valve is grossly normal. No evidence of mitral valve  regurgitation.   6. The aortic valve is tricuspid. Aortic valve regurgitation is trivial.  Aortic valve sclerosis is present, with no evidence of aortic valve  stenosis. Aortic valve mean gradient measures 7.0 mmHg.   7. The inferior vena cava is normal in size with greater than 50%  respiratory variability, suggesting right atrial pressure of 3 mmHg.   Assessment and Plan:  1.  CAD, moderate disease by recent coronary CTA with reassuring FFR analysis in April.  He does have prior documented nondominant RCA stenosis that was felt to be best managed medically given very small caliber vessel.  Clinically stable at this time, not on aspirin given use of Eliquis.  Continue Pravachol.  2.  HFpEF status post recent acute exacerbation with weight gain.  Echocardiogram in May revealed LVEF 60 to 65%, RV contraction normal.  Clinically stable at this time.  He tracks weights daily at home and has done well on Lasix 40 mg twice daily and Ozempic.  Did not tolerate Marcelline Deist previously.  He has been following a low-salt diet.  Would also resume regular exercise plan.  3.  Paroxysmal atrial fibrillation with CHA2DS2-VASc score of 4.  No palpitations and recently in sinus rhythm.  Continue Eliquis for stroke prophylaxis.  Also on Toprol-XL.  4.  Essential hypertension.  No change in current regimen.  5.  CKD stage II-III.  Recent creatinine 1.48.  Disposition:  Follow up  3 months.  Signed, Jonelle Sidle, M.D., F.A.C.C. [facility]

## 2022-07-24 ENCOUNTER — Ambulatory Visit: Payer: Medicare HMO | Attending: Cardiology | Admitting: Cardiology

## 2022-07-24 ENCOUNTER — Encounter: Payer: Self-pay | Admitting: Cardiology

## 2022-07-24 VITALS — BP 138/74 | HR 73 | Ht 65.0 in | Wt 225.2 lb

## 2022-07-24 DIAGNOSIS — I480 Paroxysmal atrial fibrillation: Secondary | ICD-10-CM | POA: Diagnosis not present

## 2022-07-24 DIAGNOSIS — E782 Mixed hyperlipidemia: Secondary | ICD-10-CM

## 2022-07-24 DIAGNOSIS — I5032 Chronic diastolic (congestive) heart failure: Secondary | ICD-10-CM

## 2022-07-24 NOTE — Patient Instructions (Addendum)
Medication Instructions:   Your physician recommends that you continue on your current medications as directed. Please refer to the Current Medication list given to you today.  Labwork:  none  Testing/Procedures:  none  Follow-Up:  Your physician recommends that you schedule a follow-up appointment in: 3 months.  Any Other Special Instructions Will Be Listed Below (If Applicable).  If you need a refill on your cardiac medications before your next appointment, please call your pharmacy. 

## 2022-07-25 ENCOUNTER — Other Ambulatory Visit: Payer: Self-pay

## 2022-07-25 DIAGNOSIS — D649 Anemia, unspecified: Secondary | ICD-10-CM

## 2022-07-26 ENCOUNTER — Inpatient Hospital Stay: Payer: Medicare HMO | Attending: Physician Assistant

## 2022-07-26 DIAGNOSIS — Z86718 Personal history of other venous thrombosis and embolism: Secondary | ICD-10-CM | POA: Diagnosis not present

## 2022-07-26 DIAGNOSIS — Z885 Allergy status to narcotic agent status: Secondary | ICD-10-CM | POA: Insufficient documentation

## 2022-07-26 DIAGNOSIS — Z8249 Family history of ischemic heart disease and other diseases of the circulatory system: Secondary | ICD-10-CM | POA: Insufficient documentation

## 2022-07-26 DIAGNOSIS — I4891 Unspecified atrial fibrillation: Secondary | ICD-10-CM | POA: Insufficient documentation

## 2022-07-26 DIAGNOSIS — I2510 Atherosclerotic heart disease of native coronary artery without angina pectoris: Secondary | ICD-10-CM | POA: Insufficient documentation

## 2022-07-26 DIAGNOSIS — K5900 Constipation, unspecified: Secondary | ICD-10-CM | POA: Diagnosis not present

## 2022-07-26 DIAGNOSIS — N401 Enlarged prostate with lower urinary tract symptoms: Secondary | ICD-10-CM | POA: Diagnosis not present

## 2022-07-26 DIAGNOSIS — E782 Mixed hyperlipidemia: Secondary | ICD-10-CM | POA: Insufficient documentation

## 2022-07-26 DIAGNOSIS — Z87891 Personal history of nicotine dependence: Secondary | ICD-10-CM | POA: Insufficient documentation

## 2022-07-26 DIAGNOSIS — J45909 Unspecified asthma, uncomplicated: Secondary | ICD-10-CM | POA: Diagnosis not present

## 2022-07-26 DIAGNOSIS — Z79899 Other long term (current) drug therapy: Secondary | ICD-10-CM | POA: Diagnosis not present

## 2022-07-26 DIAGNOSIS — G4733 Obstructive sleep apnea (adult) (pediatric): Secondary | ICD-10-CM | POA: Insufficient documentation

## 2022-07-26 DIAGNOSIS — Z86711 Personal history of pulmonary embolism: Secondary | ICD-10-CM | POA: Insufficient documentation

## 2022-07-26 DIAGNOSIS — Z820 Family history of epilepsy and other diseases of the nervous system: Secondary | ICD-10-CM | POA: Insufficient documentation

## 2022-07-26 DIAGNOSIS — I509 Heart failure, unspecified: Secondary | ICD-10-CM | POA: Diagnosis not present

## 2022-07-26 DIAGNOSIS — Z9089 Acquired absence of other organs: Secondary | ICD-10-CM | POA: Insufficient documentation

## 2022-07-26 DIAGNOSIS — J90 Pleural effusion, not elsewhere classified: Secondary | ICD-10-CM | POA: Diagnosis not present

## 2022-07-26 DIAGNOSIS — Z800 Family history of malignant neoplasm of digestive organs: Secondary | ICD-10-CM | POA: Insufficient documentation

## 2022-07-26 DIAGNOSIS — I10 Essential (primary) hypertension: Secondary | ICD-10-CM | POA: Diagnosis not present

## 2022-07-26 DIAGNOSIS — I700 Atherosclerosis of aorta: Secondary | ICD-10-CM | POA: Diagnosis not present

## 2022-07-26 DIAGNOSIS — D649 Anemia, unspecified: Secondary | ICD-10-CM | POA: Diagnosis not present

## 2022-07-26 DIAGNOSIS — Z833 Family history of diabetes mellitus: Secondary | ICD-10-CM | POA: Insufficient documentation

## 2022-07-26 DIAGNOSIS — Z888 Allergy status to other drugs, medicaments and biological substances status: Secondary | ICD-10-CM | POA: Insufficient documentation

## 2022-07-26 DIAGNOSIS — R768 Other specified abnormal immunological findings in serum: Secondary | ICD-10-CM | POA: Diagnosis not present

## 2022-07-26 DIAGNOSIS — Z8719 Personal history of other diseases of the digestive system: Secondary | ICD-10-CM | POA: Insufficient documentation

## 2022-07-26 DIAGNOSIS — Z85828 Personal history of other malignant neoplasm of skin: Secondary | ICD-10-CM | POA: Diagnosis not present

## 2022-07-26 DIAGNOSIS — E039 Hypothyroidism, unspecified: Secondary | ICD-10-CM | POA: Insufficient documentation

## 2022-07-26 DIAGNOSIS — Z808 Family history of malignant neoplasm of other organs or systems: Secondary | ICD-10-CM | POA: Insufficient documentation

## 2022-07-26 LAB — CBC WITH DIFFERENTIAL/PLATELET
Abs Immature Granulocytes: 0.1 10*3/uL — ABNORMAL HIGH (ref 0.00–0.07)
Basophils Absolute: 0.1 10*3/uL (ref 0.0–0.1)
Basophils Relative: 1 %
Eosinophils Absolute: 0.4 10*3/uL (ref 0.0–0.5)
Eosinophils Relative: 4 %
HCT: 32.7 % — ABNORMAL LOW (ref 39.0–52.0)
Hemoglobin: 10.6 g/dL — ABNORMAL LOW (ref 13.0–17.0)
Immature Granulocytes: 1 %
Lymphocytes Relative: 15 %
Lymphs Abs: 1.5 10*3/uL (ref 0.7–4.0)
MCH: 29.4 pg (ref 26.0–34.0)
MCHC: 32.4 g/dL (ref 30.0–36.0)
MCV: 90.8 fL (ref 80.0–100.0)
Monocytes Absolute: 1 10*3/uL (ref 0.1–1.0)
Monocytes Relative: 10 %
Neutro Abs: 6.8 10*3/uL (ref 1.7–7.7)
Neutrophils Relative %: 69 %
Platelets: 231 10*3/uL (ref 150–400)
RBC: 3.6 MIL/uL — ABNORMAL LOW (ref 4.22–5.81)
RDW: 14.3 % (ref 11.5–15.5)
WBC: 9.8 10*3/uL (ref 4.0–10.5)
nRBC: 0 % (ref 0.0–0.2)

## 2022-07-26 LAB — COMPREHENSIVE METABOLIC PANEL
ALT: 18 U/L (ref 0–44)
AST: 16 U/L (ref 15–41)
Albumin: 3.6 g/dL (ref 3.5–5.0)
Alkaline Phosphatase: 59 U/L (ref 38–126)
Anion gap: 10 (ref 5–15)
BUN: 39 mg/dL — ABNORMAL HIGH (ref 8–23)
CO2: 24 mmol/L (ref 22–32)
Calcium: 8.8 mg/dL — ABNORMAL LOW (ref 8.9–10.3)
Chloride: 97 mmol/L — ABNORMAL LOW (ref 98–111)
Creatinine, Ser: 1.51 mg/dL — ABNORMAL HIGH (ref 0.61–1.24)
GFR, Estimated: 49 mL/min — ABNORMAL LOW (ref >60)
Glucose, Bld: 427 mg/dL — ABNORMAL HIGH (ref 70–99)
Potassium: 4.6 mmol/L (ref 3.5–5.1)
Sodium: 131 mmol/L — ABNORMAL LOW (ref 135–145)
Total Bilirubin: 0.4 mg/dL (ref 0.3–1.2)
Total Protein: 7 g/dL (ref 6.5–8.1)

## 2022-07-26 LAB — IRON AND TIBC
Iron: 80 ug/dL (ref 45–182)
Saturation Ratios: 25 % (ref 17.9–39.5)
TIBC: 317 ug/dL (ref 250–450)
UIBC: 237 ug/dL

## 2022-07-26 LAB — FERRITIN: Ferritin: 104 ng/mL (ref 24–336)

## 2022-07-28 ENCOUNTER — Encounter: Payer: Self-pay | Admitting: *Deleted

## 2022-07-28 ENCOUNTER — Other Ambulatory Visit: Payer: Self-pay | Admitting: Nurse Practitioner

## 2022-07-28 NOTE — Progress Notes (Signed)
Fax notification received from [employer] that elquis 5 mg BID approved from 07/27/2022 through 01/02/2023.

## 2022-08-02 ENCOUNTER — Inpatient Hospital Stay: Payer: Medicare HMO | Admitting: Oncology

## 2022-08-02 ENCOUNTER — Encounter: Payer: Self-pay | Admitting: Oncology

## 2022-08-02 ENCOUNTER — Ambulatory Visit: Payer: Self-pay | Admitting: *Deleted

## 2022-08-02 VITALS — BP 122/63 | HR 81 | Temp 97.4°F | Resp 18 | Wt 225.0 lb

## 2022-08-02 DIAGNOSIS — Z885 Allergy status to narcotic agent status: Secondary | ICD-10-CM | POA: Diagnosis not present

## 2022-08-02 DIAGNOSIS — N401 Enlarged prostate with lower urinary tract symptoms: Secondary | ICD-10-CM | POA: Diagnosis not present

## 2022-08-02 DIAGNOSIS — I4891 Unspecified atrial fibrillation: Secondary | ICD-10-CM | POA: Diagnosis not present

## 2022-08-02 DIAGNOSIS — Z86718 Personal history of other venous thrombosis and embolism: Secondary | ICD-10-CM | POA: Diagnosis not present

## 2022-08-02 DIAGNOSIS — K5900 Constipation, unspecified: Secondary | ICD-10-CM | POA: Diagnosis not present

## 2022-08-02 DIAGNOSIS — I2510 Atherosclerotic heart disease of native coronary artery without angina pectoris: Secondary | ICD-10-CM | POA: Diagnosis not present

## 2022-08-02 DIAGNOSIS — D649 Anemia, unspecified: Secondary | ICD-10-CM | POA: Diagnosis not present

## 2022-08-02 DIAGNOSIS — Z87891 Personal history of nicotine dependence: Secondary | ICD-10-CM | POA: Diagnosis not present

## 2022-08-02 DIAGNOSIS — Z8719 Personal history of other diseases of the digestive system: Secondary | ICD-10-CM | POA: Diagnosis not present

## 2022-08-02 DIAGNOSIS — E039 Hypothyroidism, unspecified: Secondary | ICD-10-CM | POA: Diagnosis not present

## 2022-08-02 DIAGNOSIS — G4733 Obstructive sleep apnea (adult) (pediatric): Secondary | ICD-10-CM | POA: Diagnosis not present

## 2022-08-02 DIAGNOSIS — E782 Mixed hyperlipidemia: Secondary | ICD-10-CM | POA: Diagnosis not present

## 2022-08-02 DIAGNOSIS — R768 Other specified abnormal immunological findings in serum: Secondary | ICD-10-CM | POA: Diagnosis not present

## 2022-08-02 DIAGNOSIS — Z888 Allergy status to other drugs, medicaments and biological substances status: Secondary | ICD-10-CM | POA: Diagnosis not present

## 2022-08-02 DIAGNOSIS — J45909 Unspecified asthma, uncomplicated: Secondary | ICD-10-CM | POA: Diagnosis not present

## 2022-08-02 DIAGNOSIS — I10 Essential (primary) hypertension: Secondary | ICD-10-CM | POA: Diagnosis not present

## 2022-08-02 DIAGNOSIS — Z9089 Acquired absence of other organs: Secondary | ICD-10-CM | POA: Diagnosis not present

## 2022-08-02 DIAGNOSIS — Z85828 Personal history of other malignant neoplasm of skin: Secondary | ICD-10-CM | POA: Diagnosis not present

## 2022-08-02 DIAGNOSIS — Z833 Family history of diabetes mellitus: Secondary | ICD-10-CM | POA: Diagnosis not present

## 2022-08-02 DIAGNOSIS — J90 Pleural effusion, not elsewhere classified: Secondary | ICD-10-CM | POA: Diagnosis not present

## 2022-08-02 DIAGNOSIS — I700 Atherosclerosis of aorta: Secondary | ICD-10-CM | POA: Diagnosis not present

## 2022-08-02 DIAGNOSIS — Z86711 Personal history of pulmonary embolism: Secondary | ICD-10-CM | POA: Diagnosis not present

## 2022-08-02 DIAGNOSIS — Z79899 Other long term (current) drug therapy: Secondary | ICD-10-CM | POA: Diagnosis not present

## 2022-08-02 DIAGNOSIS — I509 Heart failure, unspecified: Secondary | ICD-10-CM | POA: Diagnosis not present

## 2022-08-02 NOTE — Progress Notes (Signed)
[facility] [address], [place] [address]   Clinic Day:  08/02/2022  Referring physician: [name], MD  Patient Care Team: [name], MD as PCP - General (Family Medicine) Jonelle Sidle, MD as PCP - Cardiology (Cardiology) Dohmeier, Porfirio Mylar, MD as Consulting Physician (Neurology) [facility], [facility] Doreatha Massed, MD as Consulting Physician (Hematology) Dani Gobble, NP as Nurse Practitioner (Nurse Practitioner) Clinton Gallant, RN as [facility] Care Management   CHIEF COMPLAINT/PURPOSE OF CONSULT:   Diagnosis: elevated sFLC, normocytic anemia  HISTORY OF PRESENT ILLNESS:   Paul Baker is a 72 y.o. male returns for a follow up for normocytic anemia and elevated serum free light chains He is alone today.  In the interim, he was hospitalized from 07/11/2022-07/14/2022 for heart failure exacerbation and shortness of breath.  He was diuresed with IV Lasix with improvement of his symptoms.  He was discharged with Lasix 40 mg twice daily and to follow-up with his cardiologist.  Since he has returned home he reports appetite is 50% with very to little energy.  He has 3 out of 10 lower back pain.  Has occasional cough and shortness of breath secondary to interstitial lung disease and CHF.  Has rotating constipation and diarrhea.  Has increased urinary frequency due to medications.  Reports his weight has been fluctuating between 217 and 225 pounds.  States has been taking his medications and restricting his fluids per his cardiologist.  He has follow-up next week but feels like he may need to see them sooner.    PAST MEDICAL HISTORY:   Past Medical History: Past Medical History:  Diagnosis Date   Arthritis    Asthma    Atrial fibrillation [facility])    Diagnosed December 2021   Colon polyp    Coronary atherosclerosis of native coronary artery    a. 2011: cath showing 90% stenosis along small non-dominant  RCA (too small for PCI). b. 01/2018: cath showing nonobstructive CAD with 60 to 70% proximal to mid nondominant RCA stenosis, 50% mid LAD and 40 to 50% OM1   DVT (deep venous thrombosis) (HCC) 2005   Right arm   Essential hypertension    Headache    History of transfusion    Hypothyroidism    Mixed hyperlipidemia    Morbid obesity (HCC)    MRSA (methicillin resistant staph aureus) culture positive    08/2012   Narcolepsy    OSA (obstructive sleep apnea)    CPAP   Osteoarthritis    Pneumonia    Psoriasis    Pulmonary embolism (HCC) 2004   RLS (restless legs syndrome)    Rotator cuff disorder    Left   Septic arthritis of knee, left (HCC)    Skin cancer, basal cell    Type 2 diabetes mellitus (HCC)     Surgical History: Past Surgical History:  Procedure Laterality Date   BACK SURGERY     BIOPSY  01/31/2022   Procedure: BIOPSY;  Surgeon: Lynann Bologna, DO;  Location: [facility];  Service: Gastroenterology;;   CATARACT EXTRACTION Bilateral    COLONOSCOPY     COLONOSCOPY WITH PROPOFOL N/A 01/31/2022   Procedure: COLONOSCOPY WITH PROPOFOL;  Surgeon: Lynann Bologna, DO;  Location: [facility];  Service: Gastroenterology;  Laterality: N/A;   CYST REMOVAL TRUNK     from back   ESOPHAGOGASTRODUODENOSCOPY (EGD) WITH PROPOFOL N/A 01/31/2022   Procedure: ESOPHAGOGASTRODUODENOSCOPY (EGD) WITH PROPOFOL;  Surgeon: Lorenso Quarry,  Heinz Knuckles, DO;  Location: [facility];  Service: Gastroenterology;  Laterality: N/A;   EYE SURGERY Left 2016   laser to left eye   HIP SURGERY     bone removed from both sides of hip   KNEE ARTHROTOMY Right 12/04/2014   Procedure: KNEE ARTHROTOMY PATELLA LIGAMENT RECONSTRUSION AND REPAIR RIGHT KNEE;  Surgeon: Durene Romans, MD;  Location: [facility] OR;  Service: Orthopedics;  Laterality: Right;   KNEE SURGERY     X 25 TIMES   LEFT HEART CATH AND CORONARY ANGIOGRAPHY N/A 01/17/2018   Procedure: LEFT HEART CATH AND CORONARY ANGIOGRAPHY;  Surgeon: Lyn Records, MD;   Location: [facility];  Service: Cardiovascular;  Laterality: N/A;   LUMBAR DISC SURGERY     Left L3, L4, L5 discecotomy with decompression of L4 root   POLYPECTOMY  01/31/2022   Procedure: POLYPECTOMY;  Surgeon: Lynann Bologna, DO;  Location: [facility];  Service: Gastroenterology;;   TONSILLECTOMY     TOTAL KNEE ARTHROPLASTY  2003   LEFT   TOTAL KNEE ARTHROPLASTY Right 03/23/2014   Procedure: RIGHT TOTAL KNEE ARTHROPLASTY AND REMOVAL RIGHT TIBIAL  DEEP IMPLANT STAPLE;  Surgeon: Durene Romans, MD;  Location: [facility];  Service: Orthopedics;  Laterality: Right;   TOTAL KNEE REVISION  2005   LEFT   WRIST SURGERY      Social History: Social History   Socioeconomic History   Marital status: Married    Spouse name: Engineer, materials   Number of children: 2   Years of education: college   Highest education level: Not on file  Occupational History   Occupation: Disabled    Employer: UNEMPLOYED  Tobacco Use   Smoking status: Former    Current packs/day: 0.00    Average packs/day: 1.5 packs/day for 27.5 years (41.3 ttl pk-yrs)    Types: Cigarettes    Start date: 06/19/1967    Quit date: 01/03/1995    Years since quitting: 27.5    Passive exposure: Never   Smokeless tobacco: Never  Vaping Use   Vaping status: Never Used  Substance and Sexual Activity   Alcohol use: No    Alcohol/week: 0.0 standard drinks of alcohol    Comment: quit drinking in 07/86   Drug use: No   Sexual activity: Yes    Partners: Female  Other Topics Concern   Not on file  Social History Narrative    72 year old, right-handed, caucasian male with a past medical history of obesity, hypertension, hyperlipidemia, diabetes, obstructive sleep apnea, presenting with frequent nighttime awakenings, excessive daytime sleepiness, also transient confusional episodes.RLS and one beosity, OSA on CPAP with AHI of 3.2 and  setting of 16 cm water , [facility] .   Married since 1977.   Social Determinants of Health    Financial Resource Strain: Low Risk  (05/05/2022)   Overall Financial Resource Strain (CARDIA)    Difficulty of Paying Living Expenses: Not hard at all  Food Insecurity: Patient Declined (07/12/2022)   Hunger Vital Sign    Worried About Running Out of Food in the Last Year: Patient declined    Ran Out of Food in the Last Year: Patient declined  Transportation Needs: No Transportation Needs (07/17/2022)   PRAPARE - Administrator, Civil Service (Medical): No    Lack of Transportation (Non-Medical): No  Physical Activity: Insufficiently Active (05/05/2022)   Exercise Vital Sign    Days of Exercise per Week: 3 days    Minutes of Exercise per Session:  30 min  Stress: No Stress Concern Present (05/05/2022)   [employer] of Occupational Health - Occupational Stress Questionnaire    Feeling of Stress : Not at all  Social Connections: Socially Integrated (05/05/2022)   Social Connection and Isolation Panel [NHANES]    Frequency of Communication with Friends and Family: More than three times a week    Frequency of Social Gatherings with Friends and Family: More than three times a week    Attends Religious Services: More than 4 times per year    Active Member of [employer] or Organizations: Yes    Attends Engineer, structural: More than 4 times per year    Marital Status: Married  Catering manager Violence: Not At Risk (05/05/2022)   Humiliation, Afraid, Rape, and Kick questionnaire    Fear of Current or Ex-Partner: No    Emotionally Abused: No    Physically Abused: No    Sexually Abused: No    Family History: Family History  Problem Relation Age of Onset   Hypertension Father    Heart attack Father    Kidney Stones Father    Seizures Grandchild    Narcolepsy Grandchild    Diabetes Sister     Current Medications:  Current Outpatient Medications:    acetaminophen (TYLENOL) 325 MG tablet, Take 2 tablets (650 mg total) by mouth every 6 (six) hours as needed for mild  pain (or Fever >/= 101). (Patient taking differently: Take 1,000 mg by mouth every 6 (six) hours as needed for mild pain (or Fever >/= 101).), Disp: 30 tablet, Rfl: 1   albuterol (PROVENTIL) (2.5 MG/3ML) 0.083% nebulizer solution, Take 3 mLs (2.5 mg total) by nebulization every 6 (six) hours as needed for up to 7 days for wheezing or shortness of breath., Disp: 84 mL, Rfl: 3   albuterol (VENTOLIN HFA) 108 (90 Base) MCG/ACT inhaler, Inhale 2 puffs into the lungs every 6 (six) hours as needed for wheezing or shortness of breath., Disp: 18 g, Rfl: 5   alfuzosin (UROXATRAL) 10 MG 24 hr tablet, Take 10 mg by mouth daily., Disp: , Rfl:    Alpha Lipoic Acid-Biotin ER 600-450 MG-MCG TBCR, Take 600 mg by mouth every morning. (Patient taking differently: Take 600 mg by mouth at bedtime.), Disp: , Rfl:    apixaban (ELIQUIS) 5 MG TABS tablet, Take 1 tablet (5 mg total) by mouth 2 (two) times daily. For stroke prevention, Disp: 180 tablet, Rfl: 1   clobetasol cream (TEMOVATE) 0.05 %, Apply thin amount 2 times daily prn to psoriasis not for face axilla or groin, Disp: 45 g, Rfl: 1   Continuous Glucose Sensor (DEXCOM G7 SENSOR) MISC, Change sensor every 10 days, Disp: 3 each, Rfl: 3   dextromethorphan-guaiFENesin (MUCINEX DM) 30-600 MG 12hr tablet, Take 1 tablet by mouth 2 (two) times daily., Disp: 20 tablet, Rfl: 0   ezetimibe (ZETIA) 10 MG tablet, Take 1 tablet by mouth once daily, Disp: 90 tablet, Rfl: 0   ferrous sulfate 325 (65 FE) MG EC tablet, Take 1 tablet (325 mg total) by mouth daily with breakfast. (Patient taking differently: Take 325 mg by mouth at bedtime.), Disp: 30 tablet, Rfl: 3   FIASP FLEXTOUCH 100 UNIT/ML FlexTouch Pen, Inject 30 Units into the skin in the morning, at noon, and at bedtime. 30-38 units per sliding scale, Disp: , Rfl:    fluticasone (FLONASE) 50 MCG/ACT nasal spray, Place 1 spray into both nostrils daily., Disp: 16 g, Rfl: 2   Fluticasone-Umeclidin-Vilant (TRELEGY  ELLIPTA)  100-62.5-25 MCG/ACT AEPB, Inhale 1 puff into the lungs daily., Disp: 60 each, Rfl: 0   furosemide (LASIX) 40 MG tablet, Take 1 tablet (40 mg total) by mouth 2 (two) times daily., Disp: 60 tablet, Rfl: 3   Glucagon (GVOKE HYPOPEN 2-PACK) 1 MG/0.2ML SOAJ, Inject 1 mg subcutaneously once as needed for low blood sugar. May repeat dose in 15 minutes as needed using a new device., Disp: 0.4 mL, Rfl: 1   insulin glargine, 2 Unit Dial, (TOUJEO MAX SOLOSTAR) 300 UNIT/ML Solostar Pen, Inject 120 Units into the skin at bedtime., Disp: 36 mL, Rfl: 3   Insulin Pen Needle (B-D ULTRAFINE III SHORT PEN) 31G X 8 MM MISC, USE 1 PEN NEEDLE THREE TIMES DAILY, Disp: 100 each, Rfl: 2   isosorbide mononitrate (IMDUR) 30 MG 24 hr tablet, Take 1 tablet (30 mg total) by mouth daily. (Patient taking differently: Take 15 mg by mouth daily. 15mg  AM and 15 PM), Disp: 90 tablet, Rfl: 1   levothyroxine (SYNTHROID) 137 MCG tablet, TAKE 1 TABLET BY MOUTH ONCE DAILY BEFORE BREAKFAST. PLEASE KEEP YOUR APPOINTMENT ON 04/03/2022., Disp: 90 tablet, Rfl: 0   loratadine (CLARITIN) 10 MG tablet, Take 10 mg by mouth daily as needed for allergies. , Disp: , Rfl:    losartan (COZAAR) 50 MG tablet, Take 50 mg by mouth daily., Disp: , Rfl:    metoprolol succinate (TOPROL XL) 25 MG 24 hr tablet, Take 1 tablet (25 mg total) by mouth 2 (two) times daily., Disp: 270 tablet, Rfl: 1   modafinil (PROVIGIL) 200 MG tablet, Take 1 tablet by mouth once daily, Disp: 90 tablet, Rfl: 2   nitroGLYCERIN (NITROSTAT) 0.4 MG SL tablet, DISSOLVE 1 TABLET UNDER TONGUE EVERY 5 MINUTES UP TO 15 MIN FOR CHESTPAIN. IF NO RELIEF CALL 911., Disp: 25 tablet, Rfl: 3   ONE TOUCH ULTRA TEST test strip, TEST BLOOD SUGAR UP TO 4 TIMES DAILY., Disp: 150 each, Rfl: 5   ONETOUCH DELICA LANCETS 33G MISC, USE AS DIRECTED TO TEST BLOOD SUGAR 4 TIMES DAILY., Disp: 150 each, Rfl: 5   pravastatin (PRAVACHOL) 40 MG tablet, TAKE 1 TABLET BY MOUTH ONCE DAILY IN THE EVENING, Disp: 90 tablet,  Rfl: 3   rOPINIRole (REQUIP XL) 4 MG 24 hr tablet, Take 1 tablet (4 mg total) by mouth at bedtime., Disp: 90 tablet, Rfl: 1   rOPINIRole (REQUIP) 4 MG tablet, Take 0.5 tablets (2 mg total) by mouth 2 (two) times daily., Disp: 90 tablet, Rfl: 2   Semaglutide, 1 MG/DOSE, 4 MG/3ML SOPN, Inject 1 mg as directed once a week., Disp: 6 mL, Rfl: 3   Allergies: Allergies  Allergen Reactions   Tape Rash    Pulls off skin   Farxiga [Dapagliflozin]     Yeast infections, felt sick with it   Cardizem [Diltiazem Hcl]     Edema    Cardura [Doxazosin Mesylate]     Headaches / cramps   Crestor [Rosuvastatin] Other (See Comments)    Leg cramps   Lipitor [Atorvastatin] Other (See Comments)    Leg cramps   Paxil [Paroxetine Hcl]     Unknown reaction    Codeine Rash and Other (See Comments)    Headache    Gabapentin Rash    REVIEW OF SYSTEMS:   Review of Systems  Constitutional:  Positive for fatigue. Negative for chills and fever.  HENT:   Negative for lump/mass, mouth sores, nosebleeds, sore throat and trouble swallowing.   Eyes:  Negative  for eye problems.  Respiratory:  Positive for cough and shortness of breath.   Cardiovascular:  Negative for chest pain, leg swelling and palpitations.  Gastrointestinal:  Positive for diarrhea and nausea. Negative for abdominal pain, blood in stool, constipation and vomiting.  Genitourinary:  Negative for bladder incontinence, difficulty urinating, dysuria, frequency, hematuria and nocturia.   Musculoskeletal:  Positive for arthralgias. Negative for back pain, flank pain, myalgias and neck pain.  Skin:  Positive for rash. Negative for itching.  Neurological:  Positive for dizziness, headaches and numbness.  Hematological:  Does not bruise/bleed easily.  Psychiatric/Behavioral:  Negative for depression, sleep disturbance and suicidal ideas. The patient is not nervous/anxious.   All other systems reviewed and are negative.    VITALS:   There were no  vitals taken for this visit.  Wt Readings from Last 3 Encounters:  07/24/22 225 lb 3.2 oz (102.2 kg)  07/14/22 219 lb 5.7 oz (99.5 kg)  07/11/22 231 lb (104.8 kg)    There is no height or weight on file to calculate BMI.   PHYSICAL EXAM:   Physical Exam Vitals reviewed.  Constitutional:      Appearance: Normal appearance. He is obese.  HENT:     Head: Normocephalic and atraumatic.  Cardiovascular:     Rate and Rhythm: Normal rate and regular rhythm.     Heart sounds: Normal heart sounds.  Pulmonary:     Effort: Pulmonary effort is normal.     Breath sounds: Normal breath sounds. No wheezing or rhonchi.  Abdominal:     General: Bowel sounds are normal.     Palpations: There is no hepatomegaly or splenomegaly.  Musculoskeletal:     Right lower leg: Edema present.     Left lower leg: Edema present.  Neurological:     General: No focal deficit present.     Mental Status: He is alert and oriented to person, place, and time.  Psychiatric:        Mood and Affect: Mood normal.        Behavior: Behavior normal.     LABS:      Latest Ref Rng & Units 07/26/2022   10:35 AM 07/11/2022   10:32 AM 05/21/2022    6:39 PM  CBC  WBC 4.0 - 10.5 K/uL 9.8  8.9  8.6   Hemoglobin 13.0 - 17.0 g/dL 54.0  9.8  98.1   Hematocrit 39.0 - 52.0 % 32.7  30.5  35.2   Platelets 150 - 400 K/uL 231  249  226       Latest Ref Rng & Units 07/26/2022   10:35 AM 07/14/2022    4:47 AM 07/13/2022    4:55 AM  CMP  Glucose 70 - 99 mg/dL 191  478  295   BUN 8 - 23 mg/dL 39  59  48   Creatinine 0.61 - 1.24 mg/dL 6.21  3.08  6.57   Sodium 135 - 145 mmol/L 131  129  130   Potassium 3.5 - 5.1 mmol/L 4.6  4.1  4.5   Chloride 98 - 111 mmol/L 97  92  94   CO2 22 - 32 mmol/L 24  23  25    Calcium 8.9 - 10.3 mg/dL 8.8  9.5  9.3   Total Protein 6.5 - 8.1 g/dL 7.0     Total Bilirubin 0.3 - 1.2 mg/dL 0.4     Alkaline Phos 38 - 126 U/L 59     AST 15 - 41 U/L  16     ALT 0 - 44 U/L 18        No results found  for: "CEA1", "CEA" / No results found for: "CEA1", "CEA" Lab Results  Component Value Date   PSA1 2.7 09/11/2018   No results found for: "UJW119" No results found for: "CAN125"  Lab Results  Component Value Date   ALBUMINELP 3.6 02/28/2022   MSPIKE Not Observed 02/28/2022   Lab Results  Component Value Date   TIBC 317 07/26/2022   TIBC 355 04/21/2022   TIBC 308 11/02/2021   FERRITIN 104 07/26/2022   FERRITIN 61 04/21/2022   FERRITIN 155 11/02/2021   IRONPCTSAT 25 07/26/2022   IRONPCTSAT 15 (L) 04/21/2022   IRONPCTSAT 10 (L) 11/02/2021   Lab Results  Component Value Date   LDH 181 04/21/2022     STUDIES:   CT Chest Wo Contrast  Result Date: 07/14/2022 CLINICAL DATA:  Chronic shortness of breath EXAM: CT CHEST WITHOUT CONTRAST TECHNIQUE: Multidetector CT imaging of the chest was performed following the standard protocol without IV contrast. RADIATION DOSE REDUCTION: This exam was performed according to the departmental dose-optimization program which includes automated exposure control, adjustment of the mA and/or kV according to patient size and/or use of iterative reconstruction technique. COMPARISON:  CTA chest dated 05/23/2020. FINDINGS: Cardiovascular: The heart is normal in size. No pericardial effusion. No evidence of thoracic aortic aneurysm. Atherosclerotic calcifications of the aortic arch. Severe three-vessel coronary atherosclerosis. Mediastinum/Nodes: No suspicious mediastinal lymphadenopathy. Visualized thyroid is unremarkable. Lungs/Pleura: Mild scarring in the posteromedial right lower lobe. Mild linear scarring/atelectasis in the lingula and left lower lobe. No focal consolidation. No suspicious pulmonary nodules. No pleural effusion or pneumothorax. Upper Abdomen: Visualized upper abdomen is notable for mild hepatic steatosis. Musculoskeletal: Degenerative changes of the visualized thoracolumbar spine. IMPRESSION: No acute cardiopulmonary disease. Aortic  Atherosclerosis (ICD10-I70.0). Electronically Signed   By: Charline Bills M.D.   On: 07/14/2022 23:06   CT Angio Chest PE W and/or Wo Contrast  Result Date: 07/11/2022 CLINICAL DATA:  Pulmonary embolism (PE) suspected, high prob; Epigastric pain EXAM: CT ANGIOGRAPHY CHEST CT ABDOMEN AND PELVIS WITH CONTRAST TECHNIQUE: Multidetector CT imaging of the chest was performed using the standard protocol during bolus administration of intravenous contrast. Multiplanar CT image reconstructions and MIPs were obtained to evaluate the vascular anatomy. Multidetector CT imaging of the abdomen and pelvis was performed using the standard protocol during bolus administration of intravenous contrast. RADIATION DOSE REDUCTION: This exam was performed according to the departmental dose-optimization program which includes automated exposure control, adjustment of the mA and/or kV according to patient size and/or use of iterative reconstruction technique. CONTRAST:  OMNIPAQUE IOHEXOL 350 MG/ML SOLN COMPARISON:  07/08/2022 CT, 03/11/2021 CT FINDINGS: CTA CHEST FINDINGS Cardiovascular: Satisfactory opacification of the pulmonary arteries to the segmental level. No evidence of pulmonary embolism. Thoracic aorta is nonaneurysmal. Atherosclerotic calcifications of the aorta and coronary arteries. Upper normal heart size. No pericardial effusion. Mediastinum/Nodes: No enlarged mediastinal, hilar, or axillary lymph nodes. Thyroid gland, trachea, and esophagus demonstrate no significant findings. Lungs/Pleura: Trace left pleural effusion. Mild ground-glass attenuation with interlobular septal thickening, more pronounced within the perihilar regions and upper lobes bilaterally. No pneumothorax. Musculoskeletal: No chest wall abnormality. No acute or significant osseous findings. Anterior endplate spurring within the mid to lower thoracic spine. Review of the MIP images confirms the above findings. CT ABDOMEN and PELVIS FINDINGS  Hepatobiliary: No focal liver abnormality is seen. No gallstones, gallbladder wall thickening, or biliary dilatation.  Pancreas: Unremarkable. No pancreatic ductal dilatation or surrounding inflammatory changes. Spleen: Normal in size without focal abnormality. Adrenals/Urinary Tract: Unremarkable adrenal glands. Kidneys enhance symmetrically without focal lesion, stone, or hydronephrosis. Ureters are nondilated. Urinary bladder appears unremarkable for the degree of distention. Stomach/Bowel: Stomach is within normal limits. Appendix appears normal (series 2, image 34). No evidence of bowel wall thickening, distention, or inflammatory changes. Vascular/Lymphatic: Aortic atherosclerosis. No enlarged abdominal or pelvic lymph nodes. Reproductive: Prostatomegaly. Other: No free fluid. No abdominopelvic fluid collection. No pneumoperitoneum. No abdominal wall hernia. Musculoskeletal: No acute or significant osseous findings. Unchanged soft tissue thickening along the lower anterior abdominal wall, likely reflecting injection-related changes. Review of the MIP images confirms the above findings. IMPRESSION: 1. No evidence of pulmonary embolism. 2. Mild ground-glass attenuation with interlobular septal thickening, more pronounced within the perihilar regions and upper lobes bilaterally. Findings are favored to represent mild pulmonary edema. 3. Trace left pleural effusion. 4. No acute abdominopelvic findings. 5. Prostatomegaly. 6. Aortic and coronary artery atherosclerosis (ICD10-I70.0). Electronically Signed   By: Duanne Guess D.O.   On: 07/11/2022 12:31   CT ABDOMEN PELVIS W CONTRAST  Result Date: 07/11/2022 CLINICAL DATA:  Pulmonary embolism (PE) suspected, high prob; Epigastric pain EXAM: CT ANGIOGRAPHY CHEST CT ABDOMEN AND PELVIS WITH CONTRAST TECHNIQUE: Multidetector CT imaging of the chest was performed using the standard protocol during bolus administration of intravenous contrast. Multiplanar CT image  reconstructions and MIPs were obtained to evaluate the vascular anatomy. Multidetector CT imaging of the abdomen and pelvis was performed using the standard protocol during bolus administration of intravenous contrast. RADIATION DOSE REDUCTION: This exam was performed according to the departmental dose-optimization program which includes automated exposure control, adjustment of the mA and/or kV according to patient size and/or use of iterative reconstruction technique. CONTRAST:  OMNIPAQUE IOHEXOL 350 MG/ML SOLN COMPARISON:  07/08/2022 CT, 03/11/2021 CT FINDINGS: CTA CHEST FINDINGS Cardiovascular: Satisfactory opacification of the pulmonary arteries to the segmental level. No evidence of pulmonary embolism. Thoracic aorta is nonaneurysmal. Atherosclerotic calcifications of the aorta and coronary arteries. Upper normal heart size. No pericardial effusion. Mediastinum/Nodes: No enlarged mediastinal, hilar, or axillary lymph nodes. Thyroid gland, trachea, and esophagus demonstrate no significant findings. Lungs/Pleura: Trace left pleural effusion. Mild ground-glass attenuation with interlobular septal thickening, more pronounced within the perihilar regions and upper lobes bilaterally. No pneumothorax. Musculoskeletal: No chest wall abnormality. No acute or significant osseous findings. Anterior endplate spurring within the mid to lower thoracic spine. Review of the MIP images confirms the above findings. CT ABDOMEN and PELVIS FINDINGS Hepatobiliary: No focal liver abnormality is seen. No gallstones, gallbladder wall thickening, or biliary dilatation. Pancreas: Unremarkable. No pancreatic ductal dilatation or surrounding inflammatory changes. Spleen: Normal in size without focal abnormality. Adrenals/Urinary Tract: Unremarkable adrenal glands. Kidneys enhance symmetrically without focal lesion, stone, or hydronephrosis. Ureters are nondilated. Urinary bladder appears unremarkable for the degree of distention.  Stomach/Bowel: Stomach is within normal limits. Appendix appears normal (series 2, image 34). No evidence of bowel wall thickening, distention, or inflammatory changes. Vascular/Lymphatic: Aortic atherosclerosis. No enlarged abdominal or pelvic lymph nodes. Reproductive: Prostatomegaly. Other: No free fluid. No abdominopelvic fluid collection. No pneumoperitoneum. No abdominal wall hernia. Musculoskeletal: No acute or significant osseous findings. Unchanged soft tissue thickening along the lower anterior abdominal wall, likely reflecting injection-related changes. Review of the MIP images confirms the above findings. IMPRESSION: 1. No evidence of pulmonary embolism. 2. Mild ground-glass attenuation with interlobular septal thickening, more pronounced within the perihilar regions and upper lobes bilaterally.  Findings are favored to represent mild pulmonary edema. 3. Trace left pleural effusion. 4. No acute abdominopelvic findings. 5. Prostatomegaly. 6. Aortic and coronary artery atherosclerosis (ICD10-I70.0). Electronically Signed   By: Duanne Guess D.O.   On: 07/11/2022 12:31   DG Chest Portable 1 View  Result Date: 07/11/2022 CLINICAL DATA:  dyspnea EXAM: PORTABLE CHEST 1 VIEW COMPARISON:  CXR 06/06/22 FINDINGS: No pleural effusion. No pneumothorax. Unchanged cardiac and mediastinal contours. Low lung volumes. Hazy bibasilar airspace opacities are favored to represent atelectasis. No radiographically apparent displaced rib fractures. Visualized upper abdomen is unremarkable. IMPRESSION: Low lung volumes with bibasilar atelectasis Electronically Signed   By: Lorenza Cambridge M.D.   On: 07/11/2022 11:28     ASSESSMENT & PLAN:   Assessment:  1.  Elevated free kappa light chains: - Workup for anemia with SPEP-no M spike, immunofixation-normal, kappa light chains 35.7, lambda light chains 25.3, ratio 1.41. - He has diabetes for 25 years and has tingling in the hands and feet.  2.  Social/family history: -  Lives at home with his wife and is independent of ADLs and IADLs.  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  No exposure to chemicals.  Quit smoking in 1997, smoked 1 pack/day for 10 years. - Sister had kidney cancer, mother died of liver cancer.  Father had basal cell skin cancer.  Plan:  1.  Elevated serum free light chains: - Kappa light chains most likely elevated from mild CKD. - Labs from 04/20/2012 showed elevation of light chain with normal ratio.  24-hour UPEP did not reveal monoclonal protein. -Elevated serum free light chain is from inflammation versus kidney dysfunction. -No additional workup is needed at this time.  2.  Normocytic anemia: -Labs from 07/26/2022 show hemoglobin of 10.6 with MCV of 90.8. -Iron levels show an iron saturation 25% with a ferritin of 104. -Previously have ruled out nutritional deficiencies and hemolysis.  Stool occult negative. -EGD/colonoscopy on 01/31/2022 did not reveal any evidence of bleeding. -He was started on ferrous sulfate 325 mg daily and vitamin B12 supplements 500 mcg daily. -Discussed if hemoglobin continues to be low could consider EPO such as retacrit given his CKD.   3. Heart Failure: -He was recently hospitalized and diuresed and follows up outpatient with cardiology. -At discharge his weight was 217 pounds.  Today he weighs 225 pounds. -Continue meds per cardiology and follow-up.  PLAN SUMMARY: >> RTC in 5-6 months with labs before and see MD/NP >> F/u with cardiology.     I spent 25 minutes dedicated to the care of this patient (face-to-face and non-face-to-face) on the date of the encounter to include what is described in the assessment and plan.  Durenda Hurt, NP 08/02/2022 11:18 AM

## 2022-08-02 NOTE — Patient Outreach (Signed)
  Care Coordination   08/02/2022 Name: Paul Baker MRN: [id number] DOB: [birth year]   Care Coordination Outreach Attempts:  An unsuccessful telephone outreach was attempted for a scheduled appointment today.  Follow Up Plan:  Additional outreach attempts will be made to offer the patient care coordination information and services.   Encounter Outcome:  No Answer   Care Coordination Interventions:  No, not indicated    Averie Meiner L. Noelle Penner, RN, BSN, CCM [facility] Care Management Community Coordinator Office number [phone]

## 2022-08-03 ENCOUNTER — Encounter: Payer: Self-pay | Admitting: Cardiology

## 2022-08-04 ENCOUNTER — Other Ambulatory Visit: Payer: Self-pay

## 2022-08-04 MED ORDER — TORSEMIDE 20 MG PO TABS: 40.0000 mg | ORAL_TABLET | Freq: Two times a day (BID) | ORAL | 3 refills | Status: DC

## 2022-08-09 ENCOUNTER — Telehealth: Payer: Self-pay | Admitting: *Deleted

## 2022-08-09 NOTE — Progress Notes (Signed)
  Care Coordination Note  08/09/2022 Name: Paul Baker MRN: [id number] DOB: 1950/05/02  Paul Baker is a 72 y.o. year old male who is a primary care patient of Luking, Jonna Coup, MD and is actively engaged with the care management team. I reached out to [name] by phone today to assist with re-scheduling a follow up visit with the RN Case Manager  Follow up plan: Unsuccessful telephone outreach attempt made. A HIPAA compliant phone message was left for the patient providing contact information and requesting a return call.   [facility]  Care Coordination Care Guide  Direct Dial: [phone]

## 2022-08-10 ENCOUNTER — Ambulatory Visit: Payer: Medicare HMO | Admitting: Nurse Practitioner

## 2022-08-11 NOTE — Progress Notes (Signed)
  Care Coordination Note  08/11/2022 Name: Paul Baker MRN: [id number] DOB: September 11, 1950  Paul Baker is a 72 y.o. year old male who is a primary care patient of Luking, Jonna Coup, MD and is actively engaged with the care management team. I reached out to [name] by phone today to assist with re-scheduling a follow up visit with the RN Case Manager  Follow up plan: Telephone appointment with care management team member scheduled for:8/14  [facility] Coordination Care Guide  Direct Dial: [phone]

## 2022-08-16 ENCOUNTER — Ambulatory Visit: Payer: Self-pay | Admitting: *Deleted

## 2022-08-16 NOTE — Patient Outreach (Signed)
  Care Coordination   Initial Visit Note   08/17/2022 Name: Paul Baker MRN: [id number] DOB: 1950-01-06  Paul Baker is a 72 y.o. year old male who sees Luking, Jonna Coup, MD for primary care. I spoke with  Paul Baker by phone today.  What matters to the patients health and wellness today?  Diabetes cbg 87 this morning  His lowest cbg has been in the 50s or below At night he uses sliding scale insulin  congestive heart failure Continues to monitor his weight and report elevated weights to MD   Anemia- resolve   Eliquis medication assistance completed    Goals Addressed             This Visit's Progress    [facility] RN CM care coordination services       Interventions Today    Flowsheet Row Most Recent Value  Chronic Disease   Chronic disease during today's visit Diabetes, Congestive Heart Failure (CHF)  General Interventions   General Interventions Discussed/Reviewed General Interventions Discussed, Doctor Visits  Doctor Visits Discussed/Reviewed Doctor Visits Discussed, PCP, Specialist  Exercise Interventions   Exercise Discussed/Reviewed Exercise Discussed, Physical Activity  Education Interventions   Education Provided Provided Education  Provided Verbal Education On Medication, Blood Sugar Monitoring  Mental Health Interventions   Mental Health Discussed/Reviewed Mental Health Discussed, Coping Strategies  Nutrition Interventions   Nutrition Discussed/Reviewed Nutrition Discussed, Decreasing salt, Decreasing sugar intake, Fluid intake  Pharmacy Interventions   Pharmacy Dicussed/Reviewed Pharmacy Topics Discussed, Medications and their functions, Affording Medications  [discuss eliquis medicine assistance completed]                SDOH assessments and interventions completed:  Yes     Care Coordination Interventions:  Yes, provided   Follow up plan: Follow up call scheduled for 09/19/22    Encounter Outcome:  Pt. Visit Completed     L. Paul Penner, RN, BSN, CCM [facility] Care Management Community Coordinator Office number [phone]

## 2022-08-18 NOTE — Patient Instructions (Signed)
Visit Information  Thank you for taking time to visit with me today. Please don't hesitate to contact me if I can be of assistance to you.   Following are the goals we discussed today:   Goals Addressed             This Visit's Progress    [facility] RN CM care coordination services       Interventions Today    Flowsheet Row Most Recent Value  Chronic Disease   Chronic disease during today's visit Diabetes, Congestive Heart Failure (CHF)  General Interventions   General Interventions Discussed/Reviewed General Interventions Discussed, Doctor Visits  Doctor Visits Discussed/Reviewed Doctor Visits Discussed, PCP, Specialist  Exercise Interventions   Exercise Discussed/Reviewed Exercise Discussed, Physical Activity  Education Interventions   Education Provided Provided Education  Provided Verbal Education On Medication, Blood Sugar Monitoring  Mental Health Interventions   Mental Health Discussed/Reviewed Mental Health Discussed, Coping Strategies  Nutrition Interventions   Nutrition Discussed/Reviewed Nutrition Discussed, Decreasing salt, Decreasing sugar intake, Fluid intake  Pharmacy Interventions   Pharmacy Dicussed/Reviewed Pharmacy Topics Discussed, Medications and their functions, Affording Medications  [discuss eliquis medicine assistance completed]                Our next appointment is by telephone on 09/19/22 at 1 pm   Please call the care guide team at [phone] if you need to cancel or reschedule your appointment.   If you are experiencing a Mental Health or Behavioral Health Crisis or need someone to talk to, please call the Suicide and Crisis Lifeline: 988 call the [place] National Suicide Prevention Lifeline: 479-193-2564 or TTY: 630 741 6717 TTY 281-873-2426) to talk to a trained counselor call 1-800-273-TALK (toll free, 24 hour hotline) call the [facility]: 340-198-6020 call 911   Patient verbalizes understanding of instructions and  care plan provided today and agrees to view in MyChart. Active MyChart status and patient understanding of how to access instructions and care plan via MyChart confirmed with patient.     The patient has been provided with contact information for the care management team and has been advised to call with any health related questions or concerns.    L. Noelle Penner, RN, BSN, CCM [facility] Care Management Community Coordinator Office number [phone]

## 2022-08-23 DIAGNOSIS — E1165 Type 2 diabetes mellitus with hyperglycemia: Secondary | ICD-10-CM | POA: Diagnosis not present

## 2022-08-24 ENCOUNTER — Telehealth: Payer: Self-pay | Admitting: Nurse Practitioner

## 2022-08-24 NOTE — Telephone Encounter (Signed)
Called pt to let him know that his pt assistance is here ready for pick up. NO answer no VM

## 2022-08-28 ENCOUNTER — Telehealth: Payer: Self-pay | Admitting: Neurology

## 2022-08-28 NOTE — Telephone Encounter (Signed)
LVM and sent mychart msg informing pt of need to reschedule 11/15/22 appt - MD out

## 2022-08-29 NOTE — Telephone Encounter (Signed)
Called pt and left a VM that his pt assistance is here

## 2022-08-30 ENCOUNTER — Ambulatory Visit: Payer: Medicare HMO | Admitting: Nurse Practitioner

## 2022-09-01 NOTE — Telephone Encounter (Signed)
Patient picked up his ozempic

## 2022-09-07 ENCOUNTER — Ambulatory Visit: Payer: Medicare HMO | Admitting: Nurse Practitioner

## 2022-09-07 ENCOUNTER — Encounter: Payer: Self-pay | Admitting: Nurse Practitioner

## 2022-09-07 VITALS — BP 130/73 | HR 84 | Ht 65.0 in | Wt 228.6 lb

## 2022-09-07 DIAGNOSIS — E785 Hyperlipidemia, unspecified: Secondary | ICD-10-CM | POA: Diagnosis not present

## 2022-09-07 DIAGNOSIS — N400 Enlarged prostate without lower urinary tract symptoms: Secondary | ICD-10-CM | POA: Diagnosis not present

## 2022-09-07 DIAGNOSIS — I10 Essential (primary) hypertension: Secondary | ICD-10-CM

## 2022-09-07 DIAGNOSIS — Z794 Long term (current) use of insulin: Secondary | ICD-10-CM

## 2022-09-07 DIAGNOSIS — M1990 Unspecified osteoarthritis, unspecified site: Secondary | ICD-10-CM | POA: Diagnosis not present

## 2022-09-07 DIAGNOSIS — E782 Mixed hyperlipidemia: Secondary | ICD-10-CM | POA: Diagnosis not present

## 2022-09-07 DIAGNOSIS — E039 Hypothyroidism, unspecified: Secondary | ICD-10-CM | POA: Diagnosis not present

## 2022-09-07 DIAGNOSIS — E038 Other specified hypothyroidism: Secondary | ICD-10-CM

## 2022-09-07 DIAGNOSIS — Z7985 Long-term (current) use of injectable non-insulin antidiabetic drugs: Secondary | ICD-10-CM | POA: Diagnosis not present

## 2022-09-07 DIAGNOSIS — E1159 Type 2 diabetes mellitus with other circulatory complications: Secondary | ICD-10-CM

## 2022-09-07 DIAGNOSIS — N529 Male erectile dysfunction, unspecified: Secondary | ICD-10-CM | POA: Diagnosis not present

## 2022-09-07 DIAGNOSIS — K219 Gastro-esophageal reflux disease without esophagitis: Secondary | ICD-10-CM | POA: Diagnosis not present

## 2022-09-07 DIAGNOSIS — G2581 Restless legs syndrome: Secondary | ICD-10-CM | POA: Diagnosis not present

## 2022-09-07 DIAGNOSIS — G4733 Obstructive sleep apnea (adult) (pediatric): Secondary | ICD-10-CM | POA: Diagnosis not present

## 2022-09-07 DIAGNOSIS — N189 Chronic kidney disease, unspecified: Secondary | ICD-10-CM | POA: Diagnosis not present

## 2022-09-07 DIAGNOSIS — R32 Unspecified urinary incontinence: Secondary | ICD-10-CM | POA: Diagnosis not present

## 2022-09-07 DIAGNOSIS — J309 Allergic rhinitis, unspecified: Secondary | ICD-10-CM | POA: Diagnosis not present

## 2022-09-07 MED ORDER — SEMAGLUTIDE (2 MG/DOSE) 8 MG/3ML ~~LOC~~ SOPN: 2.0000 mg | PEN_INJECTOR | SUBCUTANEOUS | Status: DC

## 2022-09-07 MED ORDER — TRESIBA FLEXTOUCH 200 UNIT/ML ~~LOC~~ SOPN: 120.0000 [IU] | PEN_INJECTOR | Freq: Every evening | SUBCUTANEOUS | Status: AC

## 2022-09-07 NOTE — Progress Notes (Signed)
09/07/2022   Endocrinology follow-up note   Subjective:    Patient ID: Paul Baker, male    DOB: Jan 12, 1950, PCP [name], MD   Past Medical History:  Diagnosis Date   Arthritis    Asthma    Atrial fibrillation [facility])    Diagnosed December 2021   Colon polyp    Coronary atherosclerosis of native coronary artery    a. 2011: cath showing 90% stenosis along small non-dominant RCA (too small for PCI). b. 01/2018: cath showing nonobstructive CAD with 60 to 70% proximal to mid nondominant RCA stenosis, 50% mid LAD and 40 to 50% OM1   DVT (deep venous thrombosis) (HCC) 2005   Right arm   Essential hypertension    Headache    History of transfusion    Hypothyroidism    Mixed hyperlipidemia    Morbid obesity (HCC)    MRSA (methicillin resistant staph aureus) culture positive    08/2012   Narcolepsy    OSA (obstructive sleep apnea)    CPAP   Osteoarthritis    Pneumonia    Psoriasis    Pulmonary embolism (HCC) 2004   RLS (restless legs syndrome)    Rotator cuff disorder    Left   Septic arthritis of knee, left (HCC)    Skin cancer, basal cell    Type 2 diabetes mellitus (HCC)    Past Surgical History:  Procedure Laterality Date   BACK SURGERY     BIOPSY  01/31/2022   Procedure: BIOPSY;  Surgeon: Lynann Bologna, DO;  Location: [facility];  Service: Gastroenterology;;   CATARACT EXTRACTION Bilateral    COLONOSCOPY     COLONOSCOPY WITH PROPOFOL N/A 01/31/2022   Procedure: COLONOSCOPY WITH PROPOFOL;  Surgeon: Lynann Bologna, DO;  Location: [facility];  Service: Gastroenterology;  Laterality: N/A;   CYST REMOVAL TRUNK     from back   ESOPHAGOGASTRODUODENOSCOPY (EGD) WITH PROPOFOL N/A 01/31/2022   Procedure: ESOPHAGOGASTRODUODENOSCOPY (EGD) WITH PROPOFOL;  Surgeon: Lynann Bologna, DO;  Location: [facility];  Service: Gastroenterology;  Laterality: N/A;   EYE SURGERY Left 2016   laser to left eye   HIP SURGERY     bone removed from both sides of hip    KNEE ARTHROTOMY Right 12/04/2014   Procedure: KNEE ARTHROTOMY PATELLA LIGAMENT RECONSTRUSION AND REPAIR RIGHT KNEE;  Surgeon: Durene Romans, MD;  Location: [facility];  Service: Orthopedics;  Laterality: Right;   KNEE SURGERY     X 25 TIMES   LEFT HEART CATH AND CORONARY ANGIOGRAPHY N/A 01/17/2018   Procedure: LEFT HEART CATH AND CORONARY ANGIOGRAPHY;  Surgeon: Lyn Records, MD;  Location: [facility];  Service: Cardiovascular;  Laterality: N/A;   LUMBAR DISC SURGERY     Left L3, L4, L5 discecotomy with decompression of L4 root   POLYPECTOMY  01/31/2022   Procedure: POLYPECTOMY;  Surgeon: Lynann Bologna, DO;  Location: [facility];  Service: Gastroenterology;;   TONSILLECTOMY     TOTAL KNEE ARTHROPLASTY  2003   LEFT   TOTAL KNEE ARTHROPLASTY Right 03/23/2014   Procedure: RIGHT TOTAL KNEE ARTHROPLASTY AND REMOVAL RIGHT TIBIAL  DEEP IMPLANT STAPLE;  Surgeon: Durene Romans, MD;  Location: [facility];  Service: Orthopedics;  Laterality: Right;   TOTAL KNEE REVISION  2005   LEFT   WRIST SURGERY     Social History   Socioeconomic History   Marital status: Married    Spouse name: Lynnell Dike   Number of children: 2  Years of education: college   Highest education level: Not on file  Occupational History   Occupation: Disabled    Employer: UNEMPLOYED  Tobacco Use   Smoking status: Former    Current packs/day: 0.00    Average packs/day: 1.5 packs/day for 27.5 years (41.3 ttl pk-yrs)    Types: Cigarettes    Start date: 06/19/1967    Quit date: 01/03/1995    Years since quitting: 27.6    Passive exposure: Never   Smokeless tobacco: Never  Vaping Use   Vaping status: Never Used  Substance and Sexual Activity   Alcohol use: No    Alcohol/week: 0.0 standard drinks of alcohol    Comment: quit drinking in 07/86   Drug use: No   Sexual activity: Yes    Partners: Female  Other Topics Concern   Not on file  Social History Narrative    72 year old, right-handed, caucasian male with a past  medical history of obesity, hypertension, hyperlipidemia, diabetes, obstructive sleep apnea, presenting with frequent nighttime awakenings, excessive daytime sleepiness, also transient confusional episodes.RLS and one beosity, OSA on CPAP with AHI of 3.2 and  setting of 16 cm water , [facility] .   Married since 1977.   Social Determinants of Health   Financial Resource Strain: Low Risk  (05/05/2022)   Overall Financial Resource Strain (CARDIA)    Difficulty of Paying Living Expenses: Not hard at all  Food Insecurity: Patient Declined (07/12/2022)   Hunger Vital Sign    Worried About Running Out of Food in the Last Year: Patient declined    Ran Out of Food in the Last Year: Patient declined  Transportation Needs: No Transportation Needs (07/17/2022)   PRAPARE - Administrator, Civil Service (Medical): No    Lack of Transportation (Non-Medical): No  Physical Activity: Insufficiently Active (05/05/2022)   Exercise Vital Sign    Days of Exercise per Week: 3 days    Minutes of Exercise per Session: 30 min  Stress: No Stress Concern Present (05/05/2022)   [employer] of Occupational Health - Occupational Stress Questionnaire    Feeling of Stress : Not at all  Social Connections: Socially Integrated (05/05/2022)   Social Connection and Isolation Panel [NHANES]    Frequency of Communication with Friends and Family: More than three times a week    Frequency of Social Gatherings with Friends and Family: More than three times a week    Attends Religious Services: More than 4 times per year    Active Member of [employer] or Organizations: Yes    Attends Banker Meetings: More than 4 times per year    Marital Status: Married   Outpatient Encounter Medications as of 09/07/2022  Medication Sig   acetaminophen (TYLENOL) 325 MG tablet Take 2 tablets (650 mg total) by mouth every 6 (six) hours as needed for mild pain (or Fever >/= 101). (Patient taking differently: Take 1,000 mg by  mouth every 6 (six) hours as needed for mild pain (or Fever >/= 101).)   albuterol (VENTOLIN HFA) 108 (90 Base) MCG/ACT inhaler Inhale 2 puffs into the lungs every 6 (six) hours as needed for wheezing or shortness of breath.   alfuzosin (UROXATRAL) 10 MG 24 hr tablet Take 10 mg by mouth daily.   Alpha Lipoic Acid-Biotin ER 600-450 MG-MCG TBCR Take 600 mg by mouth every morning. (Patient taking differently: Take 600 mg by mouth at bedtime.)   apixaban (ELIQUIS) 5 MG TABS tablet Take 1 tablet (5  mg total) by mouth 2 (two) times daily. For stroke prevention   clobetasol cream (TEMOVATE) 0.05 % Apply thin amount 2 times daily prn to psoriasis not for face axilla or groin   Continuous Glucose Sensor (DEXCOM G7 SENSOR) MISC Change sensor every 10 days   dextromethorphan-guaiFENesin (MUCINEX DM) 30-600 MG 12hr tablet Take 1 tablet by mouth 2 (two) times daily.   ezetimibe (ZETIA) 10 MG tablet Take 1 tablet by mouth once daily   ferrous sulfate 325 (65 FE) MG EC tablet Take 1 tablet (325 mg total) by mouth daily with breakfast. (Patient taking differently: Take 325 mg by mouth at bedtime.)   FIASP FLEXTOUCH 100 UNIT/ML FlexTouch Pen Inject 30-36 Units into the skin in the morning, at noon, and at bedtime.   fluticasone (FLONASE) 50 MCG/ACT nasal spray Place 1 spray into both nostrils daily.   Fluticasone-Umeclidin-Vilant (TRELEGY ELLIPTA) 100-62.5-25 MCG/ACT AEPB Inhale 1 puff into the lungs daily.   Glucagon (GVOKE HYPOPEN 2-PACK) 1 MG/0.2ML SOAJ Inject 1 mg subcutaneously once as needed for low blood sugar. May repeat dose in 15 minutes as needed using a new device.   insulin degludec (TRESIBA FLEXTOUCH) 200 UNIT/ML FlexTouch Pen Inject 120 Units into the skin at bedtime.   Insulin Pen Needle (B-D ULTRAFINE III SHORT PEN) 31G X 8 MM MISC USE 1 PEN NEEDLE THREE TIMES DAILY   isosorbide mononitrate (IMDUR) 60 MG 24 hr tablet Take 60 mg by mouth daily.   levothyroxine (SYNTHROID) 137 MCG tablet TAKE 1 TABLET  BY MOUTH ONCE DAILY BEFORE BREAKFAST. PLEASE KEEP YOUR APPOINTMENT ON 04/03/2022.   loratadine (CLARITIN) 10 MG tablet Take 10 mg by mouth daily as needed for allergies.    losartan (COZAAR) 50 MG tablet Take 50 mg by mouth daily.   metoprolol succinate (TOPROL XL) 25 MG 24 hr tablet Take 1 tablet (25 mg total) by mouth 2 (two) times daily.   modafinil (PROVIGIL) 200 MG tablet Take 1 tablet by mouth once daily   nitroGLYCERIN (NITROSTAT) 0.4 MG SL tablet DISSOLVE 1 TABLET UNDER TONGUE EVERY 5 MINUTES UP TO 15 MIN FOR CHESTPAIN. IF NO RELIEF CALL 911.   ONE TOUCH ULTRA TEST test strip TEST BLOOD SUGAR UP TO 4 TIMES DAILY.   ONETOUCH DELICA LANCETS 33G MISC USE AS DIRECTED TO TEST BLOOD SUGAR 4 TIMES DAILY.   pravastatin (PRAVACHOL) 40 MG tablet TAKE 1 TABLET BY MOUTH ONCE DAILY IN THE EVENING   rOPINIRole (REQUIP XL) 4 MG 24 hr tablet Take 1 tablet (4 mg total) by mouth at bedtime.   rOPINIRole (REQUIP) 4 MG tablet Take 0.5 tablets (2 mg total) by mouth 2 (two) times daily.   Semaglutide, 2 MG/DOSE, 8 MG/3ML SOPN Inject 2 mg as directed once a week.   torsemide (DEMADEX) 20 MG tablet Take 2 tablets (40 mg total) by mouth 2 (two) times daily.   [DISCONTINUED] insulin glargine, 2 Unit Dial, (TOUJEO MAX SOLOSTAR) 300 UNIT/ML Solostar Pen Inject 120 Units into the skin at bedtime.   [DISCONTINUED] Semaglutide, 1 MG/DOSE, 4 MG/3ML SOPN Inject 1 mg as directed once a week.   albuterol (PROVENTIL) (2.5 MG/3ML) 0.083% nebulizer solution Take 3 mLs (2.5 mg total) by nebulization every 6 (six) hours as needed for up to 7 days for wheezing or shortness of breath.   No facility-administered encounter medications on file as of 09/07/2022.   ALLERGIES: Allergies  Allergen Reactions   Tape Rash    Pulls off skin   Farxiga [Dapagliflozin]  Yeast infections, felt sick with it   Cardizem [Diltiazem Hcl]     Edema    Cardura [Doxazosin Mesylate]     Headaches / cramps   Crestor [Rosuvastatin] Other (See  Comments)    Leg cramps   Lipitor [Atorvastatin] Other (See Comments)    Leg cramps   Paxil [Paroxetine Hcl]     Unknown reaction    Codeine Rash and Other (See Comments)    Headache    Gabapentin Rash   VACCINATION STATUS: Immunization History  Administered Date(s) Administered   Fluad Quad(high Dose 65+) 11/03/2021   Influenza Split 09/18/2012   Influenza,inj,Quad PF,6+ Mos 10/13/2013, 10/06/2014, 09/11/2018   Influenza-Unspecified 12/01/2015, 11/08/2016, 11/02/2020   PFIZER(Purple Top)SARS-COV-2 Vaccination 03/06/2019, 03/27/2019   PNEUMOCOCCAL CONJUGATE-20 02/24/2022   Pneumococcal Polysaccharide-23 10/02/2004, 10/03/2011   Td 05/24/2006    Diabetes He presents for his follow-up diabetic visit. He has type 2 diabetes mellitus. Onset time: Was diagnosed at approximate age of 46 years. His disease course has been stable. There are no hypoglycemic associated symptoms. Pertinent negatives for hypoglycemia include no confusion, headaches, nervousness/anxiousness, pallor, seizures or tremors. Associated symptoms include fatigue and foot paresthesias. Pertinent negatives for diabetes include no chest pain, no polydipsia, no polyphagia, no polyuria, no weakness and no weight loss. There are no hypoglycemic complications. Symptoms are stable. Diabetic complications include a CVA, heart disease, nephropathy and peripheral neuropathy. (Has had 2 mini strokes ) Risk factors for coronary artery disease include diabetes mellitus, dyslipidemia, male sex, obesity, sedentary lifestyle, tobacco exposure and hypertension. Current diabetic treatment includes intensive insulin program (and Ozempic). He is compliant with treatment most of the time. His weight is increasing steadily. He is following a generally healthy diet. When asked about meal planning, he reported none. He has had a previous visit with a dietitian. He never participates in exercise. His home blood glucose trend is decreasing steadily. His  overall blood glucose range is >200 mg/dl. (He presents today with his CGM showing improving glycemic profile.  His most recent A1c, checked during recent hospitalization was 8.9%, unchanged from previous visit.  She notes he has been taking Tresiba/Toujeo 120 units SQ nightly, Fiasp 30-36 units SQ TID and U500 20-80 units TID (says we filled out PAP for Lily to get this as his insurance dropped coverage for it).  Analysis of his CGM shows TIR 36%, TAR 64%, TBR 0% with a GMI of 8.4%. ) An ACE inhibitor/angiotensin II receptor blocker is being taken. He sees a podiatrist.Eye exam is current.  Hyperlipidemia This is a chronic problem. The current episode started more than 1 year ago. The problem is uncontrolled. Recent lipid tests were reviewed and are variable. Exacerbating diseases include chronic renal disease, diabetes, hypothyroidism and obesity. Factors aggravating his hyperlipidemia include fatty foods. Pertinent negatives include no chest pain, myalgias or shortness of breath. Current antihyperlipidemic treatment includes statins. The current treatment provides mild improvement of lipids. Compliance problems include adherence to diet and adherence to exercise.  Risk factors for coronary artery disease include dyslipidemia, diabetes mellitus, hypertension, male sex, obesity and a sedentary lifestyle.  Hypertension This is a chronic problem. The current episode started more than 1 year ago. The problem has been resolved since onset. The problem is controlled. Pertinent negatives include no chest pain, headaches, neck pain, palpitations or shortness of breath. Agents associated with hypertension include thyroid hormones. Risk factors for coronary artery disease include diabetes mellitus, dyslipidemia, male gender, obesity, sedentary lifestyle and smoking/tobacco exposure. Past treatments include angiotensin blockers,  beta blockers and diuretics. Compliance problems include exercise and diet.  Hypertensive  end-organ damage includes kidney disease, CAD/MI and CVA. Identifiable causes of hypertension include chronic renal disease and a thyroid problem.  Thyroid Problem Presents for follow-up visit. Symptoms include fatigue and leg swelling. Patient reports no anxiety, cold intolerance, constipation, depressed mood, diarrhea, heat intolerance, palpitations, tremors, weight gain or weight loss. The symptoms have been stable. Past treatments include levothyroxine. His past medical history is significant for diabetes and hyperlipidemia.     Review of systems  Constitutional: + stable body weight,  current Body mass index is 38.04 kg/m. , no fatigue, no subjective hyperthermia, no subjective hypothermia, reports mild memory impairment since mini strokes Eyes: no blurry vision, no xerophthalmia ENT: no sore throat, no nodules palpated in throat, no dysphagia/odynophagia, no hoarseness Cardiovascular: no chest pain, no shortness of breath, no palpitations, + generalized edema (been hospitalized several times due to CHF exac) Respiratory: no cough, no shortness of breath Gastrointestinal: no nausea/vomiting/diarrhea Musculoskeletal: no muscle/joint aches Skin: no rashes, no hyperemia Neurological: no tremors, no numbness, no tingling, no dizziness, + intermittent numbness/tingling to bilateral feet Psychiatric: no depression, no anxiety    Objective:    BP 130/73 (BP Location: Right Arm, Patient Position: Sitting, Cuff Size: Large)   Pulse 84   Ht 5\' 5"  (1.651 m)   Wt 228 lb 9.6 oz (103.7 kg)   BMI 38.04 kg/m   Wt Readings from Last 3 Encounters:  09/07/22 228 lb 9.6 oz (103.7 kg)  08/02/22 225 lb (102.1 kg)  07/24/22 225 lb 3.2 oz (102.2 kg)     BP Readings from Last 3 Encounters:  09/07/22 130/73  08/02/22 122/63  07/24/22 138/74     Physical Exam- Limited  Constitutional:  Body mass index is 38.04 kg/m. , not in acute distress, normal state of mind Eyes:  EOMI, no  exophthalmos Musculoskeletal: no gross deformities, strength intact in all four extremities, no gross restriction of joint movements Skin:  no rashes, no hyperemia Neurological: no tremor with outstretched hands   Diabetic Foot Exam - Simple   No data filed        Latest Ref Rng & Units 07/26/2022   10:35 AM 07/14/2022    4:47 AM 07/13/2022    4:55 AM  CMP  Glucose 70 - 99 mg/dL 440  102  725   BUN 8 - 23 mg/dL 39  59  48   Creatinine 0.61 - 1.24 mg/dL 3.66  4.40  3.47   Sodium 135 - 145 mmol/L 131  129  130   Potassium 3.5 - 5.1 mmol/L 4.6  4.1  4.5   Chloride 98 - 111 mmol/L 97  92  94   CO2 22 - 32 mmol/L 24  23  25    Calcium 8.9 - 10.3 mg/dL 8.8  9.5  9.3   Total Protein 6.5 - 8.1 g/dL 7.0     Total Bilirubin 0.3 - 1.2 mg/dL 0.4     Alkaline Phos 38 - 126 U/L 59     AST 15 - 41 U/L 16     ALT 0 - 44 U/L 18        Diabetic Labs (most recent): Lab Results  Component Value Date   HGBA1C 8.9 (H) 07/11/2022   HGBA1C 8.9 (A) 04/03/2022   HGBA1C 8.9 (A) 12/12/2021   MICROALBUR 10 06/09/2021   MICROALBUR 9.5 02/06/2019   MICROALBUR 4.6 10/08/2017   Lipid Panel     Component  Value Date/Time   CHOL 150 05/05/2022 0938   TRIG 206 (H) 05/05/2022 0938   HDL 43 05/05/2022 0938   CHOLHDL 3.5 05/05/2022 0938   CHOLHDL 3.2 08/05/2019 0334   VLDL 19 08/05/2019 0334   LDLCALC 73 05/05/2022 0938   LDLCALC 118 (H) 02/06/2019 0813     Assessment & Plan:   1) Uncontrolled type 2 diabetes mellitus with circulatory complication, with long-term current use of insulin (HCC)  -His diabetes is complicated by coronary artery disease and patient remains at an extremely high risk for more acute and chronic complications of diabetes which include CAD, CVA, CKD, retinopathy, and neuropathy. These are all discussed in detail with the patient.  He presents today with his CGM showing improving glycemic profile.  His most recent A1c, checked during recent hospitalization was 8.9%, unchanged  from previous visit.  She notes he has been taking Tresiba/Toujeo 120 units SQ nightly, Fiasp 30-36 units SQ TID and U500 20-80 units TID (says we filled out PAP for Lily to get this as his insurance dropped coverage for it).  Analysis of his CGM shows TIR 36%, TAR 64%, TBR 0% with a GMI of 8.4%.     Glucose logs and insulin administration records pertaining to this visit,  to be scanned into patient's records.  Recent labs reviewed.  - Nutritional counseling repeated at each appointment due to patients tendency to fall back in to old habits.  - The patient admits there is a room for improvement in their diet and drink choices. -  Suggestion is made for the patient to avoid simple carbohydrates from their diet including Cakes, Sweet Desserts / Pastries, Ice Cream, Soda (diet and regular), Sweet Tea, Candies, Chips, Cookies, Sweet Pastries, Store Bought Juices, Alcohol in Excess of 1-2 drinks a day, Artificial Sweeteners, Coffee Creamer, and "Sugar-free" Products. This will help patient to have stable blood glucose profile and potentially avoid unintended weight gain.   - I encouraged the patient to switch to unprocessed or minimally processed complex starch and increased protein intake (animal or plant source), fruits, and vegetables.   - Patient is advised to stick to a routine mealtimes to eat 3 meals a day and avoid unnecessary snacks (to snack only to correct hypoglycemia).  - I have approached patient with the following individualized plan to manage diabetes and patient agrees.  -I advised him NOT to use the U500 any longer, he is more at risk of dangerous low glucose using both the U500 and the basal/bolus combo we switched to between visits.  -He is advised to continue Tresiba 120 units SQ nightly, continue Fiasp 30-36 units SQ TID with meals if glucose is above 90 and he is eating (Specific instructions on how to titrate insulin dosage based on glucose readings given to patient in writing).   Will increase his Ozempic to 2 mg SQ weekly.  Will send in updated PAP form to [employer] so he can get all his diabetes medications from one place.   -He is encouraged to continue using his CGM to monitor glucose 4 times daily, before meals and before bed, and to call the clinic if he has readings less than 70 or greater than 300 for 3 tests in a row.  2) BP/HTN:  His blood pressure is controlled to target for his age.  He is advised to continue Lasix 40 mg po daily, Losartan 50 mg po daily, Norvasc 5 mg po daily, and Metoprolol 75 mg po twice daily.  Will  defer changes to cardiology.  3) Lipids/HPL:  His most recent lipid panel from 05/05/22 shows controlled LDL of 73and elevated triglycerides of 206.  He is advised to continue Pravastatin 40 mg po daily at bedtime.  Side effects and precautions discussed with him.    4)  Weight/Diet:  His Body mass index is 38.04 kg/m.-complicating his diabetes care.  He is a candidate for modest weight loss.  CDE consult in progress, exercise, and carbohydrates information provided.  5) Hypothyroidism -TSH recently checked during recent hospitalization is consistent with proper replacement.  He is advised to continue current dose of Levothyroxine at 137 mcg po daily before breakfast.  Will recheck TSH and FT4 prior to next visit.    - We discussed about the correct intake of his thyroid hormone, on empty stomach at fasting, with water, separated by at least 30 minutes from breakfast and other medications,  and separated by more than 4 hours from calcium, iron, multivitamins, acid reflux medications (PPIs). -Patient is made aware of the fact that thyroid hormone replacement is needed for life, dose to be adjusted by periodic monitoring of thyroid function tests.  6) Chronic Care/Health Maintenance: -Patient is on ACEI/ARB and Statin medications and encouraged to continue to follow up with Ophthalmology, Podiatrist at least yearly or according to  recommendations, and advised to  stay away from smoking. I have recommended yearly flu vaccine and pneumonia vaccination at least every 5 years; moderate intensity exercise for up to 150 minutes weekly; and  sleep for at least 7 hours a day.  - I advised patient to maintain close follow up with [name], MD for primary care needs.     I spent  44  minutes in the care of the patient today including review of labs from CMP, Lipids, Thyroid Function, Hematology (current and previous including abstractions from other facilities); face-to-face time discussing  his blood glucose readings/logs, discussing hypoglycemia and hyperglycemia episodes and symptoms, medications doses, his options of short and long term treatment based on the latest standards of care / guidelines;  discussion about incorporating lifestyle medicine;  and documenting the encounter. Risk reduction counseling performed per USPSTF guidelines to reduce obesity and cardiovascular risk factors.     Please refer to Patient Instructions for Blood Glucose Monitoring and Insulin/Medications Dosing Guide"  in media tab for additional information. Please  also refer to " Patient Self Inventory" in the Media  tab for reviewed elements of pertinent patient history.  Paul Baker participated in the discussions, expressed understanding, and voiced agreement with the above plans.  All questions were answered to his satisfaction. he is encouraged to contact clinic should he have any questions or concerns prior to his return visit.   Follow up plan: -Return in about 3 months (around 12/07/2022) for Diabetes F/U with A1c in office, Thyroid follow up, Previsit labs, Bring meter and logs.  Ronny Bacon, [facility] [address] [place], [place] [address] Phone: [phone] Fax: [phone]  09/07/2022, 9:30 AM

## 2022-09-08 DIAGNOSIS — G4733 Obstructive sleep apnea (adult) (pediatric): Secondary | ICD-10-CM | POA: Diagnosis not present

## 2022-09-11 ENCOUNTER — Other Ambulatory Visit: Payer: Self-pay | Admitting: Physician Assistant

## 2022-09-11 DIAGNOSIS — E1159 Type 2 diabetes mellitus with other circulatory complications: Secondary | ICD-10-CM | POA: Diagnosis not present

## 2022-09-12 DIAGNOSIS — I129 Hypertensive chronic kidney disease with stage 1 through stage 4 chronic kidney disease, or unspecified chronic kidney disease: Secondary | ICD-10-CM | POA: Diagnosis not present

## 2022-09-12 DIAGNOSIS — E1122 Type 2 diabetes mellitus with diabetic chronic kidney disease: Secondary | ICD-10-CM | POA: Diagnosis not present

## 2022-09-12 DIAGNOSIS — R609 Edema, unspecified: Secondary | ICD-10-CM | POA: Diagnosis not present

## 2022-09-12 DIAGNOSIS — Z6836 Body mass index (BMI) 36.0-36.9, adult: Secondary | ICD-10-CM | POA: Diagnosis not present

## 2022-09-12 DIAGNOSIS — I4891 Unspecified atrial fibrillation: Secondary | ICD-10-CM | POA: Diagnosis not present

## 2022-09-12 DIAGNOSIS — R42 Dizziness and giddiness: Secondary | ICD-10-CM | POA: Diagnosis not present

## 2022-09-12 DIAGNOSIS — N182 Chronic kidney disease, stage 2 (mild): Secondary | ICD-10-CM | POA: Diagnosis not present

## 2022-09-14 ENCOUNTER — Telehealth: Payer: Self-pay | Admitting: *Deleted

## 2022-09-14 NOTE — Telephone Encounter (Signed)
Have him bring the supplies to his next visit and we can decide what to do with it.

## 2022-09-14 NOTE — Telephone Encounter (Signed)
Patient left a message that he would like to talk with Whitney about the insulin that she sent in for him. Patient states that he has a lot of the Humulin 500 on hand, it has not been messed with,. His Candie Mile is coming in soon to our office. He is asking if we could use the Humulin 500 in our office, for patients who may not be able to afford it.

## 2022-09-14 NOTE — Telephone Encounter (Signed)
Patient was called and made aware. 

## 2022-09-17 ENCOUNTER — Other Ambulatory Visit: Payer: Self-pay | Admitting: Nurse Practitioner

## 2022-09-19 ENCOUNTER — Ambulatory Visit: Payer: Self-pay | Admitting: *Deleted

## 2022-09-19 NOTE — Patient Outreach (Signed)
Care Coordination   Follow Up Visit Note   09/19/2022 Name: Paul Baker MRN: [id number] DOB: [birth year]  Paul Baker is a 72 y.o. year old male who sees Luking, Jonna Coup, MD for primary care. I spoke with  Paul Baker by phone today.  What matters to the patients health and wellness today?  Diabetes cbgs was up to 350's going now down to 116  After lunch today at 316. This morning was 340  More visual & grouchy symptoms when cbg low- does not really notice symptoms when elevated - like he denies thirsty, increase voiding  Anemia resolved Stools dark but not black related to iron supplement    congestive Heart Failure (CHF) now 223 lbs but legs still swelling some but not as they had been He and wife reports he has been less active in his recliner He agrees to be more active or wear compression hose when not active       Goals Addressed             This Visit's Progress    Home management of diabetes & congestive heart failure- be more active or compression hose RN CM care coordination services   On track    Interventions Today    Flowsheet Row Most Recent Value  Chronic Disease   Chronic disease during today's visit Diabetes, Congestive Heart Failure (CHF)  General Interventions   Doctor Visits Discussed/Reviewed PCP, Specialist, Doctor Visits Reviewed  PCP/Specialist Visits Compliance with follow-up visit  Exercise Interventions   Exercise Discussed/Reviewed Exercise Reviewed, Weight Managment  Weight Management Weight maintenance  Education Interventions   Education Provided Provided Education  Provided Verbal Education On Nutrition, Labs, Blood Sugar Monitoring, Medication  Labs Reviewed Hgb A1c  Nutrition Interventions   Nutrition Discussed/Reviewed Nutrition Reviewed, Fluid intake  Pharmacy Interventions   Pharmacy Dicussed/Reviewed Pharmacy Topics Reviewed, Medications and their functions  Safety Interventions   Safety Discussed/Reviewed  Safety Discussed                SDOH assessments and interventions completed:  No     Care Coordination Interventions:  Yes, provided   Follow up plan: Follow up call scheduled for 11/20/22    Encounter Outcome:  Patient Visit Completed   Cala Bradford L. Noelle Penner, RN, BSN, CCM, Care Management Coordinator [phone]

## 2022-09-19 NOTE — Patient Instructions (Addendum)
Visit Information  Thank you for taking time to visit with me today. Please don't hesitate to contact me if I can be of assistance to you.   Following are the goals we discussed today:   Goals Addressed             This Visit's Progress    Home management of diabetes & congestive heart failure- be more active or compression hose RN CM care coordination services   On track    Interventions Today    Flowsheet Row Most Recent Value  Chronic Disease   Chronic disease during today's visit Diabetes, Congestive Heart Failure (CHF)  General Interventions   Doctor Visits Discussed/Reviewed PCP, Specialist, Doctor Visits Reviewed  PCP/Specialist Visits Compliance with follow-up visit  Exercise Interventions   Exercise Discussed/Reviewed Exercise Reviewed, Weight Managment  Weight Management Weight maintenance  Education Interventions   Education Provided Provided Education  Provided Verbal Education On Nutrition, Labs, Blood Sugar Monitoring, Medication  Labs Reviewed Hgb A1c  Nutrition Interventions   Nutrition Discussed/Reviewed Nutrition Reviewed, Fluid intake  Pharmacy Interventions   Pharmacy Dicussed/Reviewed Pharmacy Topics Reviewed, Medications and their functions  Safety Interventions   Safety Discussed/Reviewed Safety Discussed                Our next appointment is by telephone on 11/20/22 at 1 pm  Please call the care guide team at [phone] if you need to cancel or reschedule your appointment.   If you are experiencing a Mental Health or Behavioral Health Crisis or need someone to talk to, please call the Suicide and Crisis Lifeline: 988 call the [place] National Suicide Prevention Lifeline: 646-053-4023 or TTY: 714-888-8723 TTY (805)435-3579) to talk to a trained counselor call 1-800-273-TALK (toll free, 24 hour hotline) call the [facility]: [phone] call 911   Patient verbalizes understanding of instructions and care plan provided  today and agrees to view in MyChart. Active MyChart status and patient understanding of how to access instructions and care plan via MyChart confirmed with patient.     The patient has been provided with contact information for the care management team and has been advised to call with any health related questions or concerns.   Delbert Vu L. Noelle Penner, RN, BSN, CCM, Care Management Coordinator [phone]

## 2022-09-20 ENCOUNTER — Encounter (HOSPITAL_BASED_OUTPATIENT_CLINIC_OR_DEPARTMENT_OTHER): Payer: Self-pay | Admitting: Pulmonary Disease

## 2022-09-20 ENCOUNTER — Ambulatory Visit (HOSPITAL_BASED_OUTPATIENT_CLINIC_OR_DEPARTMENT_OTHER): Payer: Medicare HMO | Admitting: Pulmonary Disease

## 2022-09-20 VITALS — BP 158/80 | HR 84 | Ht 65.0 in | Wt 226.0 lb

## 2022-09-20 DIAGNOSIS — Z23 Encounter for immunization: Secondary | ICD-10-CM | POA: Diagnosis not present

## 2022-09-20 DIAGNOSIS — R0982 Postnasal drip: Secondary | ICD-10-CM | POA: Diagnosis not present

## 2022-09-20 DIAGNOSIS — J4550 Severe persistent asthma, uncomplicated: Secondary | ICD-10-CM | POA: Diagnosis not present

## 2022-09-20 NOTE — Progress Notes (Signed)
[facility]  Chief Complaint  Patient presents with   Asthma     Constitutional:  BP (!) 158/80   Pulse 84   Ht 5\' 5"  (1.651 m)   Wt 226 lb (102.5 kg)   SpO2 98%   BMI 37.61 kg/m   Past Medical History:  Rt subclavian vein stenosis, Fatty liver, Spinal stenosis, OSA, Non obstructive CAD, Colon polyps, DVT Rt arm, HA, HTN, Hypothyroidism, HLD, PNA, PE, Psoriasis, RLS, Septic arthritis Lt knee, DM, COVID 19 infection in 2022, Atrial fibrillation  Past Surgical History:  He  has a past surgical history that includes Total knee arthroplasty (2003); Lumbar disc surgery; Wrist surgery; Total knee revision (2005); Colonoscopy; Back surgery; Knee surgery; Total knee arthroplasty (Right, 03/23/2014); Tonsillectomy; Hip surgery; Eye surgery (Left, 2016); Cataract extraction (Bilateral); Knee arthrotomy (Right, 12/04/2014); Cyst removal trunk; LEFT HEART CATH AND CORONARY ANGIOGRAPHY (N/A, 01/17/2018); Colonoscopy with propofol (N/A, 01/31/2022); Esophagogastroduodenoscopy (egd) with propofol (N/A, 01/31/2022); biopsy (01/31/2022); and polypectomy (01/31/2022).  Brief Summary:  Paul Baker is a 72 y.o. male with asthma.      Subjective:   CT chest from July 2024 showed mild scarring and atelectasis.    Breathing has been okay.  Has sinus congestion and post nasal drip intermittently that causes him to cough.  Not having wheeze, chest tightness, or fever.  Was in hospital in July with diastolic CHF and pulmonary edema.    Physical Exam:   Appearance - well kempt   ENMT - no sinus tenderness, no oral exudate, no LAN, Mallampati 3 airway, no stridor, clear nasal drainage  Respiratory - equal breath sounds bilaterally, no wheezing or rales  CV - s1s2 regular rate and rhythm, no murmurs  Ext - no clubbing, no edema  Skin - changes of psoriasis  Psych - normal mood and affect     Pulmonary testing:  Spirometry 10/17/11 >> 2.58 (89%),  FEV1% 85 PFT 09/11/18 >> FEV1 2.19 (79%), FEV1% 82, TLC 4.48 (74%), DLCO 70%, +BD from change in FEF 25-75% PFT 09/20/20 >> FEV1 2.34 (87%), FEV1% 86, TLC 4.37 (72%), DLCO 94% FeNO 06/06/22 >> 18  Chest Imaging:  HRCT chest 12/19/18 >> atherosclerosis, patchy air trapping, fatty liver CT angio chest 05/23/20 >> lungs clear CT chest 07/08/22 >> RLL scarring, lingula/LLL scarring and ATX, coronary atherosclerosis, fatty liver  Sleep Tests:  PSG 2012 >> AHI 60  Cardiac Tests:  LHC 01/17/18 >> non obstructive CAD Echo 05/03/22 >> EF 60 to 65%, mild LVH, grade 1 DD  Social History:  He  reports that he quit smoking about 27 years ago. His smoking use included cigarettes. He started smoking about 55 years ago. He has a 41.3 pack-year smoking history. He has never been exposed to tobacco smoke. He has never used smokeless tobacco. He reports that he does not drink alcohol and does not use drugs.  Family History:  His family history includes Diabetes in his sister; Heart attack in his father; Hypertension in his father; Kidney Stones in his father; Narcolepsy in his grandchild; Seizures in his grandchild.     Assessment/Plan:   Severe, persistent asthma. - he will check to see if there is a more affordable option than trelegy with his insurance formulary and let us know if he would like to switch; otherwise continue trelegy 100 one puff daily - prn albuterol - prn mucinex - high dose flu shot today  Mild lung scarring and atelectasis on CT chest imaging. -  no additional radiographic follow up needed unless he develops new symptoms  Upper airway cough with post nasal drip. - will have him use nasal irrigation, claritin and flonase   Coronary artery disease, Paroxysmal atrial fibrillation, chronic HFpEF. - followed by Dr. Diona Browner with cardiology   Obstructive sleep apnea, RLS. - followed at [facility]  CKD. - followed by Dr. Kathrene Bongo with [facility]  Time  Spent Involved in Patient Care on Day of Examination:  27 minutes  Follow up:   Patient Instructions  High dose flu shot today  Follow up in 6 months  Medication List:   Allergies as of 09/20/2022       Reactions   Tape Rash   Pulls off skin   Farxiga [dapagliflozin]    Yeast infections, felt sick with it   Cardizem [diltiazem Hcl]    Edema    Cardura [doxazosin Mesylate]    Headaches / cramps   Crestor [rosuvastatin] Other (See Comments)   Leg cramps   Lipitor [atorvastatin] Other (See Comments)   Leg cramps   Paxil [paroxetine Hcl]    Unknown reaction    Codeine Rash, Other (See Comments)   Headache    Gabapentin Rash        Medication List        Accurate as of September 20, 2022 10:20 AM. If you have any questions, ask your nurse or doctor.          acetaminophen 325 MG tablet Commonly known as: TYLENOL Take 2 tablets (650 mg total) by mouth every 6 (six) hours as needed for mild pain (or Fever >/= 101). What changed: how much to take   albuterol (2.5 MG/3ML) 0.083% nebulizer solution Commonly known as: PROVENTIL Take 3 mLs (2.5 mg total) by nebulization every 6 (six) hours as needed for up to 7 days for wheezing or shortness of breath.   albuterol 108 (90 Base) MCG/ACT inhaler Commonly known as: VENTOLIN HFA Inhale 2 puffs into the lungs every 6 (six) hours as needed for wheezing or shortness of breath.   alfuzosin 10 MG 24 hr tablet Commonly known as: UROXATRAL Take 10 mg by mouth daily.   Alpha Lipoic Acid-Biotin ER 600-450 MG-MCG Tbcr Take 600 mg by mouth every morning. What changed: when to take this   apixaban 5 MG Tabs tablet Commonly known as: ELIQUIS Take 1 tablet (5 mg total) by mouth 2 (two) times daily. For stroke prevention   B-D ULTRAFINE III SHORT PEN 31G X 8 MM Misc Generic drug: Insulin Pen Needle USE 1 PEN NEEDLE THREE TIMES DAILY   clobetasol cream 0.05 % Commonly known as: TEMOVATE Apply thin amount 2 times daily prn  to psoriasis not for face axilla or groin   Dexcom G7 Sensor Misc Change sensor every 10 days   dextromethorphan-guaiFENesin 30-600 MG 12hr tablet Commonly known as: MUCINEX DM Take 1 tablet by mouth 2 (two) times daily.   ezetimibe 10 MG tablet Commonly known as: ZETIA Take 1 tablet by mouth once daily   ferrous sulfate 325 (65 FE) MG EC tablet Take 1 tablet by mouth once daily with breakfast   Fiasp FlexTouch 100 UNIT/ML FlexTouch Pen Generic drug: insulin aspart Inject 30-36 Units into the skin in the morning, at noon, and at bedtime.   fluticasone 50 MCG/ACT nasal spray Commonly known as: FLONASE Place 1 spray into both nostrils daily.   Gvoke HypoPen 2-Pack 1 MG/0.2ML Soaj Generic drug: Glucagon Inject 1 mg subcutaneously once as  needed for low blood sugar. May repeat dose in 15 minutes as needed using a new device.   isosorbide mononitrate 60 MG 24 hr tablet Commonly known as: IMDUR Take 60 mg by mouth daily.   levothyroxine 137 MCG tablet Commonly known as: SYNTHROID TAKE 1 TABLET BY MOUTH ONCE DAILY BEFORE BREAKFAST. PLEASE KEEP YOUR APPOINTMENT ON 04/03/2022.   loratadine 10 MG tablet Commonly known as: CLARITIN Take 10 mg by mouth daily as needed for allergies.   losartan 50 MG tablet Commonly known as: COZAAR Take 50 mg by mouth daily.   metoprolol succinate 25 MG 24 hr tablet Commonly known as: Toprol XL Take 1 tablet (25 mg total) by mouth 2 (two) times daily.   modafinil 200 MG tablet Commonly known as: PROVIGIL Take 1 tablet by mouth once daily   nitroGLYCERIN 0.4 MG SL tablet Commonly known as: NITROSTAT DISSOLVE 1 TABLET UNDER TONGUE EVERY 5 MINUTES UP TO 15 MIN FOR CHESTPAIN. IF NO RELIEF CALL 911.   ONE TOUCH ULTRA TEST test strip Generic drug: glucose blood TEST BLOOD SUGAR UP TO 4 TIMES DAILY.   OneTouch Delica Lancets 33G Misc USE AS DIRECTED TO TEST BLOOD SUGAR 4 TIMES DAILY.   pravastatin 40 MG tablet Commonly known as:  PRAVACHOL TAKE 1 TABLET BY MOUTH ONCE DAILY IN THE EVENING   rOPINIRole 4 MG tablet Commonly known as: REQUIP Take 0.5 tablets (2 mg total) by mouth 2 (two) times daily.   rOPINIRole 4 MG 24 hr tablet Commonly known as: REQUIP XL Take 1 tablet (4 mg total) by mouth at bedtime.   Semaglutide (2 MG/DOSE) 8 MG/3ML Sopn Inject 2 mg as directed once a week.   torsemide 20 MG tablet Commonly known as: DEMADEX Take 2 tablets (40 mg total) by mouth 2 (two) times daily.   Trelegy Ellipta 100-62.5-25 MCG/ACT Aepb Generic drug: Fluticasone-Umeclidin-Vilant Inhale 1 puff into the lungs daily.   Evaristo Bury FlexTouch 200 UNIT/ML FlexTouch Pen Generic drug: insulin degludec Inject 120 Units into the skin at bedtime.        Signature:  Coralyn Helling, MD [facility] Pager - [phone] 09/20/2022, 10:20 AM

## 2022-09-20 NOTE — Patient Instructions (Signed)
High dose flu shot today  Follow up in 6 months 

## 2022-09-21 ENCOUNTER — Telehealth: Payer: Self-pay | Admitting: Nurse Practitioner

## 2022-09-21 NOTE — Telephone Encounter (Signed)
Called pt to let him know that his pt assistance is ready for pick up. He will pick it up today. He also has pen needles

## 2022-09-21 NOTE — Telephone Encounter (Signed)
Pt picked up pt assistance.

## 2022-09-23 DIAGNOSIS — E1165 Type 2 diabetes mellitus with hyperglycemia: Secondary | ICD-10-CM | POA: Diagnosis not present

## 2022-09-26 ENCOUNTER — Ambulatory Visit: Payer: Medicare HMO | Admitting: Family Medicine

## 2022-10-05 ENCOUNTER — Ambulatory Visit (INDEPENDENT_AMBULATORY_CARE_PROVIDER_SITE_OTHER): Payer: Medicare HMO | Admitting: Family Medicine

## 2022-10-05 VITALS — BP 120/64 | HR 83 | Temp 96.4°F | Ht 65.0 in | Wt 220.0 lb

## 2022-10-05 DIAGNOSIS — D649 Anemia, unspecified: Secondary | ICD-10-CM

## 2022-10-05 DIAGNOSIS — R112 Nausea with vomiting, unspecified: Secondary | ICD-10-CM | POA: Diagnosis not present

## 2022-10-05 DIAGNOSIS — I5032 Chronic diastolic (congestive) heart failure: Secondary | ICD-10-CM | POA: Insufficient documentation

## 2022-10-05 MED ORDER — SEMAGLUTIDE (1 MG/DOSE) 4 MG/3ML ~~LOC~~ SOPN: 1.0000 mg | PEN_INJECTOR | SUBCUTANEOUS | 1 refills | Status: DC

## 2022-10-05 NOTE — Patient Instructions (Signed)
I decreased the Ozempic.  Labs ordered.  Follow up with Dr. Lorin Picket.

## 2022-10-05 NOTE — Assessment & Plan Note (Signed)
Suspect that this is related.  Decreasing the dose to 1 mg.  Labs today.

## 2022-10-05 NOTE — Progress Notes (Signed)
Subjective:  Patient ID: Paul Baker, male    DOB: [birth year]  Age: 72 y.o. MRN: [id number]  CC: Chief Complaint  Patient presents with   Headache   Dizziness    Episodes of blurry vision with low blood sugars, episode of vomiting     HPI:  72 year old male with an extensive past medical history presents for evaluation.  Patient reports he has had ongoing (3 weeks.  He said dizziness, headache, nausea, 1 episode of emesis.  States when his blood sugar seems to be getting low he has vision.  Patient reports that he recently received a few weeks ago.  He is currently taking 2 mg weekly.  It is the only change that he is aware of.  No chest pain.  No current abdominal pain.  No fever.  No other complaints at this time.  Patient Active Problem List   Diagnosis Date Noted   Chronic diastolic CHF (congestive heart failure) (HCC) 10/05/2022   Nausea and vomiting 10/05/2022   Interstitial pulmonary disease (HCC) 05/04/2022   Elevated serum immunoglobulin free light chains 04/21/2022   Normocytic anemia 04/21/2022   Severe persistent asthma without complication 02/24/2022   Carotid artery disease, unspecified laterality, unspecified type (HCC) 02/24/2022   Grade I diastolic dysfunction 12/05/2019   Paroxysmal atrial fibrillation (HCC)    Hypothyroidism    Asthma 08/05/2019   Class 2 obesity 08/05/2019   Pain in joint of right shoulder 05/05/2019   Mixed hyperlipidemia 11/25/2014   S/P knee replacement 03/23/2014   Spinal stenosis of cervicothoracic region 10/08/2013   RLS (restless legs syndrome) 10/08/2013   DM type 2 causing vascular disease (HCC) 10/08/2013   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/02/2013   Diabetic neuropathy (HCC) 07/23/2012   OSA on CPAP 04/19/2012   Essential hypertension, benign 12/16/2009   Coronary artery disease involving native coronary artery of native heart with angina pectoris (HCC) 12/16/2009    Social Hx   Social History    Socioeconomic History   Marital status: Married    Spouse name: Engineer, materials   Number of children: 2   Years of education: college   Highest education level: Not on file  Occupational History   Occupation: Disabled    Employer: UNEMPLOYED  Tobacco Use   Smoking status: Former    Current packs/day: 0.00    Average packs/day: 1.5 packs/day for 27.5 years (41.3 ttl pk-yrs)    Types: Cigarettes    Start date: 06/19/1967    Quit date: 01/03/1995    Years since quitting: 27.7    Passive exposure: Never   Smokeless tobacco: Never  Vaping Use   Vaping status: Never Used  Substance and Sexual Activity   Alcohol use: No    Alcohol/week: 0.0 standard drinks of alcohol    Comment: quit drinking in 07/86   Drug use: No   Sexual activity: Yes    Partners: Female  Other Topics Concern   Not on file  Social History Narrative    72 year old, right-handed, caucasian male with a past medical history of obesity, hypertension, hyperlipidemia, diabetes, obstructive sleep apnea, presenting with frequent nighttime awakenings, excessive daytime sleepiness, also transient confusional episodes.RLS and one beosity, OSA on CPAP with AHI of 3.2 and  setting of 16 cm water , [facility] .   Married since 1977.   Social Determinants of Health   Financial Resource Strain: Low Risk  (05/05/2022)   Overall Financial Resource Strain (CARDIA)    Difficulty of Paying Living  Expenses: Not hard at all  Food Insecurity: Patient Declined (07/12/2022)   Hunger Vital Sign    Worried About Running Out of Food in the Last Year: Patient declined    Ran Out of Food in the Last Year: Patient declined  Transportation Needs: No Transportation Needs (07/17/2022)   PRAPARE - Administrator, Civil Service (Medical): No    Lack of Transportation (Non-Medical): No  Physical Activity: Insufficiently Active (05/05/2022)   Exercise Vital Sign    Days of Exercise per Week: 3 days    Minutes of Exercise per Session: 30 min   Stress: No Stress Concern Present (05/05/2022)   [employer] of Occupational Health - Occupational Stress Questionnaire    Feeling of Stress : Not at all  Social Connections: Socially Integrated (05/05/2022)   Social Connection and Isolation Panel [NHANES]    Frequency of Communication with Friends and Family: More than three times a week    Frequency of Social Gatherings with Friends and Family: More than three times a week    Attends Religious Services: More than 4 times per year    Active Member of [employer] or Organizations: Yes    Attends Engineer, structural: More than 4 times per year    Marital Status: Married    Review of Systems Per HPI  Objective:  BP 120/64   Pulse 83   Temp (!) 96.4 F (35.8 C)   Ht 5\' 5"  (1.651 m)   Wt 220 lb (99.8 kg)   SpO2 96%   BMI 36.61 kg/m      10/05/2022    3:41 PM 09/20/2022    9:59 AM 09/19/2022    1:13 PM  BP/Weight  Systolic BP 120 158   Diastolic BP 64 80   Wt. (Lbs) 220 226 223  BMI 36.61 kg/m2 37.61 kg/m2 37.11 kg/m2    Physical Exam Vitals and nursing note reviewed.  Constitutional:      General: He is not in acute distress.    Appearance: He is obese.  Cardiovascular:     Rate and Rhythm: Normal rate and regular rhythm.  Pulmonary:     Effort: Pulmonary effort is normal.     Breath sounds: Normal breath sounds. No wheezing or rales.  Abdominal:     General: There is no distension.     Palpations: Abdomen is soft.     Tenderness: There is no abdominal tenderness.  Neurological:     Mental Status: He is alert.     Lab Results  Component Value Date   WBC 9.8 07/26/2022   HGB 10.6 (L) 07/26/2022   HCT 32.7 (L) 07/26/2022   PLT 231 07/26/2022   GLUCOSE 427 (H) 07/26/2022   CHOL 150 05/05/2022   TRIG 206 (H) 05/05/2022   HDL 43 05/05/2022   LDLCALC 73 05/05/2022   ALT 18 07/26/2022   AST 16 07/26/2022   NA 131 (L) 07/26/2022   K 4.6 07/26/2022   CL 97 (L) 07/26/2022   CREATININE 1.51 (H)  07/26/2022   BUN 39 (H) 07/26/2022   CO2 24 07/26/2022   TSH 2.370 07/11/2022   PSA 2.03 11/13/2013   INR 0.9 08/05/2019   HGBA1C 8.9 (H) 07/11/2022   MICROALBUR 10 06/09/2021     Assessment & Plan:   Problem List Items Addressed This Visit       Digestive   Nausea and vomiting - Primary    Suspect that this is related.  Decreasing the dose  to 1 mg.  Labs today.       Relevant Orders   CMP14+EGFR     Other   Normocytic anemia   Relevant Orders   CBC    Meds ordered this encounter  Medications   Semaglutide, 1 MG/DOSE, 4 MG/3ML SOPN    Sig: Inject 1 mg into the skin once a week.    Dispense:  9 mL    Refill:  1    Follow-up:  Advised to follow up with Dr. [name].  Everlene Other DO [facility]

## 2022-10-06 LAB — CBC
Hematocrit: 35.8 % — ABNORMAL LOW (ref 37.5–51.0)
Hemoglobin: 11.7 g/dL — ABNORMAL LOW (ref 13.0–17.7)
MCH: 30 pg (ref 26.6–33.0)
MCHC: 32.7 g/dL (ref 31.5–35.7)
MCV: 92 fL (ref 79–97)
Platelets: 308 10*3/uL (ref 150–450)
RBC: 3.9 x10E6/uL — ABNORMAL LOW (ref 4.14–5.80)
RDW: 14.4 % (ref 11.6–15.4)
WBC: 10 10*3/uL (ref 3.4–10.8)

## 2022-10-06 LAB — CMP14+EGFR
ALT: 14 [IU]/L (ref 0–44)
AST: 17 [IU]/L (ref 0–40)
Albumin: 4.4 g/dL (ref 3.8–4.8)
Alkaline Phosphatase: 84 [IU]/L (ref 44–121)
BUN/Creatinine Ratio: 27 — ABNORMAL HIGH (ref 10–24)
BUN: 41 mg/dL — ABNORMAL HIGH (ref 8–27)
Bilirubin Total: <0.2 mg/dL (ref 0.0–1.2)
CO2: 27 mmol/L (ref 20–29)
Calcium: 9.3 mg/dL (ref 8.6–10.2)
Chloride: 100 mmol/L (ref 96–106)
Creatinine, Ser: 1.54 mg/dL — ABNORMAL HIGH (ref 0.76–1.27)
Globulin, Total: 2.7 g/dL (ref 1.5–4.5)
Glucose: 68 mg/dL — ABNORMAL LOW (ref 70–99)
Potassium: 4.1 mmol/L (ref 3.5–5.2)
Sodium: 144 mmol/L (ref 134–144)
Total Protein: 7.1 g/dL (ref 6.0–8.5)
eGFR: 48 mL/min/{1.73_m2} — ABNORMAL LOW (ref >59)

## 2022-10-07 ENCOUNTER — Other Ambulatory Visit: Payer: Self-pay | Admitting: Family Medicine

## 2022-10-07 DIAGNOSIS — E782 Mixed hyperlipidemia: Secondary | ICD-10-CM

## 2022-10-12 ENCOUNTER — Encounter: Payer: Self-pay | Admitting: *Deleted

## 2022-10-15 ENCOUNTER — Other Ambulatory Visit: Payer: Self-pay | Admitting: Nurse Practitioner

## 2022-10-17 ENCOUNTER — Ambulatory Visit: Payer: Medicare HMO | Admitting: Family Medicine

## 2022-10-26 ENCOUNTER — Other Ambulatory Visit: Payer: Self-pay | Admitting: Nurse Practitioner

## 2022-10-31 ENCOUNTER — Ambulatory Visit: Payer: Medicare HMO | Admitting: Cardiology

## 2022-11-10 ENCOUNTER — Ambulatory Visit (INDEPENDENT_AMBULATORY_CARE_PROVIDER_SITE_OTHER): Payer: Medicare HMO | Admitting: Family Medicine

## 2022-11-10 ENCOUNTER — Ambulatory Visit: Payer: Self-pay | Admitting: Family Medicine

## 2022-11-10 VITALS — BP 124/72 | HR 90 | Ht 65.0 in | Wt 223.6 lb

## 2022-11-10 DIAGNOSIS — J3489 Other specified disorders of nose and nasal sinuses: Secondary | ICD-10-CM

## 2022-11-10 MED ORDER — DOXYCYCLINE HYCLATE 100 MG PO TABS: 100.0000 mg | ORAL_TABLET | Freq: Two times a day (BID) | ORAL | 0 refills | Status: DC

## 2022-11-10 MED ORDER — MUPIROCIN 2 % EX OINT: 1.0000 | TOPICAL_OINTMENT | Freq: Three times a day (TID) | CUTANEOUS | 0 refills | Status: AC

## 2022-11-10 NOTE — Telephone Encounter (Signed)
  Chief Complaint: facial pain Symptoms: pain, swelling Frequency: x 1 week  Pertinent Negatives: Patient denies n/a Disposition: [] ED /[] Urgent Care (no appt availability in office) / [] Appointment(In office/virtual)/ [x]  [facility]/ [] Home Care/ [] Refused Recommended Disposition /[] [facility]/ []  Follow-up with PCP Additional Notes: pt stated 6/10-9/10 pain and swelling to nose and surrounding tissue: no difficulty breathing at present time.  Office visit set for today at 3:30. Reason for Disposition  [1] MODERATE pain (e.g., interferes with normal activities) AND [2] constant AND [3] present > 24 hours  Answer Assessment - Initial Assessment Questions 1. ONSET: "When did the pain start?" (e.g., minutes, hours, days)     X 1 week gradually getting worse 2. ONSET: "Does the pain come and go, or has it been constant since it started?" (e.g., constant, intermittent, fleeting)     ongoing 3. SEVERITY: "How bad is the pain?"   (Scale 1-10; mild, moderate or severe)   - MILD (1-3): doesn't interfere with normal activities    - MODERATE (4-7): interferes with normal activities or awakens from sleep    - SEVERE (8-10): excruciating pain, unable to do any normal activities      6/10 to 9/10 4. LOCATION: "Where does it hurt?"      Nose and surrounding facial tissue 5. RASH: "Is there any redness, rash, or swelling of the face?"     no 6. FEVER: "Do you have a fever?" If Yes, ask: "What is it, how was it measured, and when did it start?"      no 7. OTHER SYMPTOMS: "Do you have any other symptoms?" (e.g., fever, toothache, nasal discharge, nasal congestion, clicking sensation in jaw joint)     Nasal drainage, swelling, pain 8. PREGNANCY: "Is there any chance you are pregnant?" "When was your last menstrual period?"     no  Protocols used: Face Pain-A-AH

## 2022-11-10 NOTE — Patient Instructions (Signed)
If you have issues with your vision, contact an eye doctor urgently (Try [facility] in [place]).  Antibiotic as prescribed.  If you worsen, go to the hospital.

## 2022-11-13 DIAGNOSIS — J3489 Other specified disorders of nose and nasal sinuses: Secondary | ICD-10-CM | POA: Insufficient documentation

## 2022-11-13 NOTE — Progress Notes (Signed)
Subjective:  Patient ID: Paul Baker, male    DOB: 1950/03/12  Age: 72 y.o. MRN: [id number]  CC:  Nose pain/facial pain   HPI:  72 year old male with an extensive past medical history presents for evaluation of the above.  Symptoms for the past week.  He reports tenderness and redness to the nose.  Associated pain with knee the left eye.  He states that he had an elevated temperature 1 night around 99.  No documented true fever.  He reports associated left eye discomfort.  No known inciting factor.  No relieving factors.  No other complaints or concerns at this time.  Patient Active Problem List   Diagnosis Date Noted   Infection of nose 11/13/2022   Chronic diastolic CHF (congestive heart failure) (HCC) 10/05/2022   Nausea and vomiting 10/05/2022   Interstitial pulmonary disease (HCC) 05/04/2022   Elevated serum immunoglobulin free light chains 04/21/2022   Normocytic anemia 04/21/2022   Severe persistent asthma without complication 02/24/2022   Carotid artery disease, unspecified laterality, unspecified type (HCC) 02/24/2022   Grade I diastolic dysfunction 12/05/2019   Paroxysmal atrial fibrillation (HCC)    Hypothyroidism    Asthma 08/05/2019   Class 2 obesity 08/05/2019   Pain in joint of right shoulder 05/05/2019   Mixed hyperlipidemia 11/25/2014   S/P knee replacement 03/23/2014   Spinal stenosis of cervicothoracic region 10/08/2013   RLS (restless legs syndrome) 10/08/2013   DM type 2 causing vascular disease (HCC) 10/08/2013   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/02/2013   Diabetic neuropathy (HCC) 07/23/2012   OSA on CPAP 04/19/2012   Essential hypertension, benign 12/16/2009   Coronary artery disease involving native coronary artery of native heart with angina pectoris (HCC) 12/16/2009    Social Hx   Social History   Socioeconomic History   Marital status: Married    Spouse name: Engineer, materials   Number of children: 2   Years of education: college    Highest education level: Not on file  Occupational History   Occupation: Disabled    Employer: UNEMPLOYED  Tobacco Use   Smoking status: Former    Current packs/day: 0.00    Average packs/day: 1.5 packs/day for 27.5 years (41.3 ttl pk-yrs)    Types: Cigarettes    Start date: 06/19/1967    Quit date: 01/03/1995    Years since quitting: 27.8    Passive exposure: Never   Smokeless tobacco: Never  Vaping Use   Vaping status: Never Used  Substance and Sexual Activity   Alcohol use: No    Alcohol/week: 0.0 standard drinks of alcohol    Comment: quit drinking in 07/86   Drug use: No   Sexual activity: Yes    Partners: Female  Other Topics Concern   Not on file  Social History Narrative    72 year old, right-handed, caucasian male with a past medical history of obesity, hypertension, hyperlipidemia, diabetes, obstructive sleep apnea, presenting with frequent nighttime awakenings, excessive daytime sleepiness, also transient confusional episodes.RLS and one beosity, OSA on CPAP with AHI of 3.2 and  setting of 16 cm water , [facility] .   Married since 1977.   Social Determinants of Health   Financial Resource Strain: Low Risk  (05/05/2022)   Overall Financial Resource Strain (CARDIA)    Difficulty of Paying Living Expenses: Not hard at all  Food Insecurity: Patient Declined (07/12/2022)   Hunger Vital Sign    Worried About Running Out of Food in the Last Year: Patient declined  Ran Out of Food in the Last Year: Patient declined  Transportation Needs: No Transportation Needs (07/17/2022)   PRAPARE - Administrator, Civil Service (Medical): No    Lack of Transportation (Non-Medical): No  Physical Activity: Insufficiently Active (05/05/2022)   Exercise Vital Sign    Days of Exercise per Week: 3 days    Minutes of Exercise per Session: 30 min  Stress: No Stress Concern Present (05/05/2022)   [employer] of Occupational Health - Occupational Stress Questionnaire     Feeling of Stress : Not at all  Social Connections: Socially Integrated (05/05/2022)   Social Connection and Isolation Panel [NHANES]    Frequency of Communication with Friends and Family: More than three times a week    Frequency of Social Gatherings with Friends and Family: More than three times a week    Attends Religious Services: More than 4 times per year    Active Member of [employer] or Organizations: Yes    Attends Engineer, structural: More than 4 times per year    Marital Status: Married    Review of Systems Per HPI  Objective:  BP 124/72   Pulse 90   Ht 5\' 5"  (1.651 m)   Wt 223 lb 9.6 oz (101.4 kg)   SpO2 98%   BMI 37.21 kg/m      11/10/2022    3:19 PM 10/05/2022    3:41 PM 09/20/2022    9:59 AM  BP/Weight  Systolic BP 124 120 158  Diastolic BP 72 64 80  Wt. (Lbs) 223.6 220 226  BMI 37.21 kg/m2 36.61 kg/m2 37.61 kg/m2    Physical Exam Constitutional:      General: He is not in acute distress.    Appearance: Normal appearance.  HENT:     Head: Normocephalic and atraumatic.     Nose:     Comments: Tip of the nose nose and around the nasal opening with erythema and exquisite tenderness to palpation. Eyes:     General:        Right eye: No discharge.        Left eye: No discharge.     Conjunctiva/sclera: Conjunctivae normal.  Cardiovascular:     Rate and Rhythm: Normal rate and regular rhythm.  Pulmonary:     Effort: Pulmonary effort is normal.     Breath sounds: Normal breath sounds. No wheezing, rhonchi or rales.  Neurological:     Mental Status: He is alert.  Psychiatric:        Mood and Affect: Mood normal.        Behavior: Behavior normal.     Lab Results  Component Value Date   WBC 10.0 10/05/2022   HGB 11.7 (L) 10/05/2022   HCT 35.8 (L) 10/05/2022   PLT 308 10/05/2022   GLUCOSE 68 (L) 10/05/2022   CHOL 150 05/05/2022   TRIG 206 (H) 05/05/2022   HDL 43 05/05/2022   LDLCALC 73 05/05/2022   ALT 14 10/05/2022   AST 17 10/05/2022   NA  144 10/05/2022   K 4.1 10/05/2022   CL 100 10/05/2022   CREATININE 1.54 (H) 10/05/2022   BUN 41 (H) 10/05/2022   CO2 27 10/05/2022   TSH 2.370 07/11/2022   PSA 2.03 11/13/2013   INR 0.9 08/05/2019   HGBA1C 8.9 (H) 07/11/2022   MICROALBUR 10 06/09/2021     Assessment & Plan:   Problem List Items Addressed This Visit       Other  Infection of nose - Primary    Based on exam, I am concerned that this is nasal vestibulitis.  Treating with doxycycline & mupirocin.  Return precautions given.      Relevant Medications   mupirocin ointment (BACTROBAN) 2 %    Meds ordered this encounter  Medications   doxycycline (VIBRA-TABS) 100 MG tablet    Sig: Take 1 tablet (100 mg total) by mouth 2 (two) times daily.    Dispense:  14 tablet    Refill:  0   mupirocin ointment (BACTROBAN) 2 %    Sig: Apply 1 Application topically 3 (three) times daily for 7 days.    Dispense:  30 g    Refill:  0    Follow-up:  Return if symptoms worsen or fail to improve.  Everlene Other DO [facility]

## 2022-11-13 NOTE — Assessment & Plan Note (Signed)
Based on exam, I am concerned that this is nasal vestibulitis.  Treating with doxycycline & mupirocin.  Return precautions given.

## 2022-11-15 ENCOUNTER — Institutional Professional Consult (permissible substitution): Payer: Medicare HMO | Admitting: Neurology

## 2022-11-17 ENCOUNTER — Telehealth: Payer: Self-pay | Admitting: Nurse Practitioner

## 2022-11-17 NOTE — Telephone Encounter (Signed)
Called pt and left a VM that her patient assistance is ready for pick up

## 2022-11-17 NOTE — Telephone Encounter (Signed)
Pt picked up pt assistance.

## 2022-11-20 ENCOUNTER — Ambulatory Visit: Payer: Self-pay | Admitting: *Deleted

## 2022-11-20 ENCOUNTER — Other Ambulatory Visit: Payer: Self-pay | Admitting: Neurology

## 2022-11-20 NOTE — Patient Outreach (Signed)
  Care Coordination   Follow Up Visit Note   11/20/2022 Name: Paul Baker MRN: [id number] DOB: 23-Feb-1950  Paul Baker is a 72 y.o. year old male who sees Luking, Jonna Coup, MD for primary care. I spoke with  [name] by phone today.  What matters to the patients health and wellness today?  Diabetes 140-190   Lows any time in day before meals & 0200  Hypoglycemia -blurry, visual changes congestive Heart Failure (CHF)- confirms some swelling, shortness of breath Today he took an extra diuretic related some swelling and coughing Compression hose at the top of knee causes pain Voiced possible use of thigh compression Voiced understanding of ACE wraps. Will think about having RN CM ask for thigh high compression Iron no longer taken   Goals Addressed             This Visit's Progress    Home management of diabetes & congestive heart failure- be more active or compression hose RN CM care coordination services   On track    Interventions Today    Flowsheet Row Most Recent Value  Chronic Disease   Chronic disease during today's visit Diabetes, Congestive Heart Failure (CHF), Other  General Interventions   General Interventions Discussed/Reviewed General Interventions Reviewed, Doctor Visits, Community Resources  Doctor Visits Discussed/Reviewed Doctor Visits Reviewed, PCP, Specialist  PCP/Specialist Visits Compliance with follow-up visit  Exercise Interventions   Exercise Discussed/Reviewed Exercise Reviewed, Physical Activity, Weight Managment  [encouraged activity at home to assist with edema of lower extremities]  Physical Activity Discussed/Reviewed Physical Activity Reviewed, Home Exercise Program (HEP)  Weight Management Weight maintenance  [discussed weight maintenance related to edema/Congestive heart failure management]  Education Interventions   Education Provided Provided Education  Provided Verbal Education On Nutrition, Labs, Development worker, community,  Medication, [employer], Blood Sugar Monitoring  [discussed iron enrich foods, HGA1c, Iron levels, protein foods, managing hypoglycemia, compression hose options]  Labs Reviewed Hgb A1c  Mental Health Interventions   Mental Health Discussed/Reviewed Mental Health Reviewed, Coping Strategies  Nutrition Interventions   Nutrition Discussed/Reviewed Nutrition Reviewed, Increasing proteins, Decreasing salt  Pharmacy Interventions   Pharmacy Dicussed/Reviewed Pharmacy Topics Reviewed, Medications and their functions, Affording Medications  Advanced Directive Interventions   Advanced Directives Discussed/Reviewed Advanced Directives Discussed  [Reports he has all this in place, no scanned advance care plan documents noted in EPIC at this time]                SDOH assessments and interventions completed:  No     Care Coordination Interventions:  Yes, provided   Follow up plan: Follow up call scheduled for 01/08/23 1 pm    Encounter Outcome:  Patient Visit Completed   Chapman Matteucci L. Noelle Penner, RN, BSN, [facility]  VBCI Care Management Coordinator  [phone]  Fax: [phone]

## 2022-11-20 NOTE — Patient Instructions (Addendum)
Visit Information  Thank you for taking time to visit with me today. Please don't hesitate to contact me if I can be of assistance to you.   Following are the goals we discussed today:   Goals Addressed             This Visit's Progress    Home management of diabetes & congestive heart failure- be more active or compression hose RN CM care coordination services   On track    Interventions Today    Flowsheet Row Most Recent Value  Chronic Disease   Chronic disease during today's visit Diabetes, Congestive Heart Failure (CHF), Other  General Interventions   General Interventions Discussed/Reviewed General Interventions Reviewed, Doctor Visits, Community Resources  Doctor Visits Discussed/Reviewed Doctor Visits Reviewed, PCP, Specialist  PCP/Specialist Visits Compliance with follow-up visit  Exercise Interventions   Exercise Discussed/Reviewed Exercise Reviewed, Physical Activity, Weight Managment  [encouraged activity at home to assist with edema of lower extremities]  Physical Activity Discussed/Reviewed Physical Activity Reviewed, Home Exercise Program (HEP)  Weight Management Weight maintenance  [discussed weight maintenance related to edema/Congestive heart failure management]  Education Interventions   Education Provided Provided Education  Provided Verbal Education On Nutrition, Labs, Development worker, community, Medication, [employer], Blood Sugar Monitoring  [discussed iron enrich foods, HGA1c, Iron levels, protein foods, managing hypoglycemia, compression hose options]  Labs Reviewed Hgb A1c  Mental Health Interventions   Mental Health Discussed/Reviewed Mental Health Reviewed, Coping Strategies  Nutrition Interventions   Nutrition Discussed/Reviewed Nutrition Reviewed, Increasing proteins, Decreasing salt  Pharmacy Interventions   Pharmacy Dicussed/Reviewed Pharmacy Topics Reviewed, Medications and their functions, Affording Medications  Advanced Directive Interventions    Advanced Directives Discussed/Reviewed Advanced Directives Discussed  [Reports he has all this in place, no scanned advance care plan documents noted in EPIC at this time]                Our next appointment is by telephone on 01/08/23 at 1 pm  Please call the care guide team at [phone] if you need to cancel or reschedule your appointment.   If you are experiencing a Mental Health or Behavioral Health Crisis or need someone to talk to, please call the Suicide and Crisis Lifeline: 988 call the [place] National Suicide Prevention Lifeline: 316-810-5715 or TTY: 289 508 2778 TTY 7695536027) to talk to a trained counselor call 1-800-273-TALK (toll free, 24 hour hotline) call the [facility]: 670-678-0239 call 911   Patient verbalizes understanding of instructions and care plan provided today and agrees to view in MyChart. Active MyChart status and patient understanding of how to access instructions and care plan via MyChart confirmed with patient.     The patient has been provided with contact information for the care management team and has been advised to call with any health related questions or concerns.   Kalyiah Saintil L. Noelle Penner, RN, BSN, [facility]  VBCI Care Management Coordinator  [phone]  Fax: [phone]

## 2022-11-20 NOTE — Telephone Encounter (Signed)
Please advise patient to make a follow-up appointment with Dr. Vickey Huger or nurse practitioner.  I will refill modafinil for 30 days. Please also review medication as office visit note from November 2023 mentions armodafinil but patient has been on modafinil as well?  I renewed the prescription for modafinil 200 mg once daily.

## 2022-11-22 ENCOUNTER — Ambulatory Visit: Payer: Medicare HMO | Admitting: Family Medicine

## 2022-11-22 ENCOUNTER — Other Ambulatory Visit: Payer: Self-pay | Admitting: Nurse Practitioner

## 2022-11-22 VITALS — BP 114/74

## 2022-11-22 DIAGNOSIS — R600 Localized edema: Secondary | ICD-10-CM

## 2022-11-22 DIAGNOSIS — E1169 Type 2 diabetes mellitus with other specified complication: Secondary | ICD-10-CM

## 2022-11-22 DIAGNOSIS — E1159 Type 2 diabetes mellitus with other circulatory complications: Secondary | ICD-10-CM | POA: Diagnosis not present

## 2022-11-22 DIAGNOSIS — R519 Headache, unspecified: Secondary | ICD-10-CM | POA: Diagnosis not present

## 2022-11-22 DIAGNOSIS — E782 Mixed hyperlipidemia: Secondary | ICD-10-CM

## 2022-11-22 DIAGNOSIS — I10 Essential (primary) hypertension: Secondary | ICD-10-CM

## 2022-11-22 DIAGNOSIS — L219 Seborrheic dermatitis, unspecified: Secondary | ICD-10-CM | POA: Diagnosis not present

## 2022-11-22 DIAGNOSIS — R0609 Other forms of dyspnea: Secondary | ICD-10-CM

## 2022-11-22 DIAGNOSIS — I25119 Atherosclerotic heart disease of native coronary artery with unspecified angina pectoris: Secondary | ICD-10-CM

## 2022-11-22 DIAGNOSIS — E785 Hyperlipidemia, unspecified: Secondary | ICD-10-CM

## 2022-11-22 MED ORDER — TORSEMIDE 20 MG PO TABS: ORAL_TABLET | ORAL | 1 refills | Status: DC

## 2022-11-22 MED ORDER — DESONIDE 0.05 % EX CREA: TOPICAL_CREAM | CUTANEOUS | 0 refills | Status: AC

## 2022-11-22 MED ORDER — ISOSORBIDE MONONITRATE ER 30 MG PO TB24: 30.0000 mg | ORAL_TABLET | Freq: Every day | ORAL | 1 refills | Status: DC

## 2022-11-22 NOTE — Patient Instructions (Signed)
Shingrix and shingles prevention: know the facts!   Shingrix is a very effective vaccine to prevent shingles.   Shingles is a reactivation of chickenpox -more than 99% of Americans born before 1980 have had chickenpox even if they do not remember it. One in every 10 people who get shingles have severe long-lasting nerve pain as a result.   33 out of a 100 older adults will get shingles if they are unvaccinated.     This vaccine is very important for your health This vaccine is indicated for anyone 50 years or older. You can get this vaccine even if you have already had shingles because you can get the disease more than once in a lifetime.  Your risk for shingles and its complications increases with age.  This vaccine has 2 doses.  The second dose would be 2 to 6 months after the first dose.  If you had Zostavax vaccine in the past you should still get Shingrix. ( Zostavax is only 70% effective and it loses significant strength over a few years .)  This vaccine is given through the pharmacy.  The cost of the vaccine is through your insurance. The pharmacy can inform you of the total costs.  Common side effects including soreness in the arm, some redness and swelling, also some feel fatigue muscle soreness headache low-grade fever.  Side effects typically go away within 2 to 3 days. Remember-the pain from shingles can last a lifetime but these side effects of the vaccine will only last a few days at most. It is very important to get both doses in order to protect yourself fully.   Please get this vaccine at your earliest convenience at your trusted pharmacy.       

## 2022-11-23 ENCOUNTER — Encounter: Payer: Self-pay | Admitting: Family Medicine

## 2022-11-23 DIAGNOSIS — E1165 Type 2 diabetes mellitus with hyperglycemia: Secondary | ICD-10-CM | POA: Diagnosis not present

## 2022-11-23 MED ORDER — EZETIMIBE 10 MG PO TABS: 10.0000 mg | ORAL_TABLET | Freq: Every day | ORAL | 3 refills | Status: DC

## 2022-11-23 NOTE — Progress Notes (Signed)
Subjective:    Patient ID: Paul Baker, male    DOB: 1950/12/14, 72 y.o.   MRN: [id number]  HPI Coronary artery disease involving native coronary artery of native heart with angina pectoris (HCC)  DM type 2 causing vascular disease (HCC)  Essential hypertension, benign - Plan: Basic Metabolic Panel  DOE (dyspnea on exertion) - Plan: Brain natriuretic peptide  Pedal edema - Plan: Brain natriuretic peptide  Temporal headache - Plan: Sedimentation Rate, C-reactive protein  Seborrheic dermatitis  Hyperlipidemia associated with type 2 diabetes mellitus (HCC)  Mixed hyperlipidemia - Plan: ezetimibe (ZETIA) 10 MG tablet  Patient comes in today with multiple related to health issues.  He states he has been getting a little more short of breath with activity states when he tries to do walking he gets short of breath especially when going up a hill he does have a history of underlying heart failure issues as well as coronary artery disease and when reviewing medications with him he states that he stopped taking Imdur because he states he did not have any additional refills he denies any substernal crushing chest pain but he does have diabetes he does state he has had increased swelling in his ankles but denies PND  The patient was seen today as part of a comprehensive diabetic check up. Patient has diabetes Patient relates good compliance with taking the medication. We discussed their diet and exercise activities  We also discussed the importance of notifying us if any excessively high glucoses or low sugars.    Patient for blood pressure check up.  The patient does have hypertension.   Patient relates dietary measures try to minimize salt The importance of healthy diet and activity were discussed Patient relates compliance  He does have hyperlipidemia and takes his medications as directed needs refill of Zetia  Does have seborrheic dermatitis on his face that he would like to  have steroid cream for he does use clobetasol for psoriasis related issues  He also states some intermittent temporal headaches that he states is more of a sharp shooting pain in the temporal region but does not sound like trigeminal neuralgia no visual difficulties this been going on for couple months   Review of Systems     Objective:   Physical Exam General-in no acute distress Eyes-no discharge Lungs-respiratory rate normal, CTA CV-no murmurs,RRR Extremities skin warm dry mild ankle and lower leg edema Neuro grossly normal Behavior normal, alert        Assessment & Plan:  1. Coronary artery disease involving native coronary artery of native heart with angina pectoris (HCC) For no particular reason he states he is no longer taking Imdur other than he states he ran out of refills and states when he sent in for refills he never got any his blood pressure little bit on the low side so we will go ahead with Imdur 30 mg to help with his heart but not overwhelm blood pressure issues  Also because of his pedal edema and history of heart failure we will check a BNP we will also allow for him to use 3 torsemide's in the morning and 2 at noontime depending on the amount of swelling he is having he is encouraged to give himself daily weights and if he has progressive symptoms follow-up with [place] or cardiology  2. DM type 2 causing vascular disease (HCC) He follows through with endocrinology for this the importance reinforced with him  3. Essential hypertension, benign Blood pressure decent  control currently check metabolic 7 reinitiate Imdur - Basic Metabolic Panel  4. DOE (dyspnea on exertion) BNP recommended.  Bump up dose of diuretic continue current measures otherwise reinitiate Imdur if not improving over next few weeks let us know follow-up within 3 to 4 months keep follow-up with cardiology await test results - Brain natriuretic peptide  5. Pedal edema See discussion above  regarding diuretics - Brain natriuretic peptide  6. Temporal headache I doubt that this is temporal arteritis but we will check lab work - Sedimentation Rate - C-reactive protein  7. Seborrheic dermatitis Desonide cream as needed basis not for frequent use  8. Hyperlipidemia associated with type 2 diabetes mellitus (HCC) Continues Zetia as well as pravastatin  9. Mixed hyperlipidemia Zetia as described above - ezetimibe (ZETIA) 10 MG tablet; Take 1 tablet (10 mg total) by mouth daily.  Dispense: 90 tablet; Refill: 3

## 2022-11-24 ENCOUNTER — Telehealth: Payer: Self-pay | Admitting: *Deleted

## 2022-11-24 LAB — SEDIMENTATION RATE: Sed Rate: 47 mm/h — ABNORMAL HIGH (ref 0–30)

## 2022-11-24 LAB — BASIC METABOLIC PANEL
BUN/Creatinine Ratio: 28 — ABNORMAL HIGH (ref 10–24)
BUN: 36 mg/dL — ABNORMAL HIGH (ref 8–27)
CO2: 26 mmol/L (ref 20–29)
Calcium: 9.4 mg/dL (ref 8.6–10.2)
Chloride: 101 mmol/L (ref 96–106)
Creatinine, Ser: 1.29 mg/dL — ABNORMAL HIGH (ref 0.76–1.27)
Glucose: 171 mg/dL — ABNORMAL HIGH (ref 70–99)
Potassium: 4.6 mmol/L (ref 3.5–5.2)
Sodium: 141 mmol/L (ref 134–144)
eGFR: 59 mL/min/{1.73_m2} — ABNORMAL LOW (ref >59)

## 2022-11-24 LAB — BRAIN NATRIURETIC PEPTIDE: BNP: 33.1 pg/mL (ref 0.0–100.0)

## 2022-11-24 LAB — C-REACTIVE PROTEIN: CRP: 7 mg/L (ref 0–10)

## 2022-11-24 NOTE — Telephone Encounter (Signed)
Patient called , message was left. He shared that his blood sugars had been running high. They have been running above 336. Returned call to the patient.Received the patient's voicemail. Message was left asking the patient to call our office back , so that we may get his recent Blood Sugar readings. Explained that our office closed at 12 pm. I would be here until 11: 45 am.

## 2022-12-01 ENCOUNTER — Other Ambulatory Visit: Payer: Self-pay | Admitting: Neurology

## 2022-12-04 DIAGNOSIS — E1159 Type 2 diabetes mellitus with other circulatory complications: Secondary | ICD-10-CM | POA: Diagnosis not present

## 2022-12-04 DIAGNOSIS — E038 Other specified hypothyroidism: Secondary | ICD-10-CM | POA: Diagnosis not present

## 2022-12-05 LAB — COMPREHENSIVE METABOLIC PANEL
ALT: 13 [IU]/L (ref 0–44)
AST: 11 [IU]/L (ref 0–40)
Albumin: 4.3 g/dL (ref 3.8–4.8)
Alkaline Phosphatase: 88 [IU]/L (ref 44–121)
BUN/Creatinine Ratio: 29 — ABNORMAL HIGH (ref 10–24)
BUN: 39 mg/dL — ABNORMAL HIGH (ref 8–27)
Bilirubin Total: 0.2 mg/dL (ref 0.0–1.2)
CO2: 23 mmol/L (ref 20–29)
Calcium: 9.6 mg/dL (ref 8.6–10.2)
Chloride: 100 mmol/L (ref 96–106)
Creatinine, Ser: 1.34 mg/dL — ABNORMAL HIGH (ref 0.76–1.27)
Globulin, Total: 2.7 g/dL (ref 1.5–4.5)
Glucose: 155 mg/dL — ABNORMAL HIGH (ref 70–99)
Potassium: 4.6 mmol/L (ref 3.5–5.2)
Sodium: 139 mmol/L (ref 134–144)
Total Protein: 7 g/dL (ref 6.0–8.5)
eGFR: 57 mL/min/{1.73_m2} — ABNORMAL LOW (ref >59)

## 2022-12-05 LAB — T4, FREE: Free T4: 1.49 ng/dL (ref 0.82–1.77)

## 2022-12-05 LAB — TSH: TSH: 1.17 u[IU]/mL (ref 0.450–4.500)

## 2022-12-06 ENCOUNTER — Ambulatory Visit (INDEPENDENT_AMBULATORY_CARE_PROVIDER_SITE_OTHER): Payer: Medicare HMO | Admitting: Family Medicine

## 2022-12-06 VITALS — BP 123/68 | HR 84 | Temp 97.9°F | Ht 65.0 in | Wt 227.4 lb

## 2022-12-06 DIAGNOSIS — R222 Localized swelling, mass and lump, trunk: Secondary | ICD-10-CM | POA: Diagnosis not present

## 2022-12-06 DIAGNOSIS — G542 Cervical root disorders, not elsewhere classified: Secondary | ICD-10-CM | POA: Diagnosis not present

## 2022-12-06 MED ORDER — OXYCODONE-ACETAMINOPHEN 5-325 MG PO TABS: ORAL_TABLET | ORAL | 0 refills | Status: DC
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
## 2022-12-07 DIAGNOSIS — G4733 Obstructive sleep apnea (adult) (pediatric): Secondary | ICD-10-CM | POA: Diagnosis not present

## 2022-12-08 ENCOUNTER — Ambulatory Visit: Payer: Medicare HMO | Admitting: Nurse Practitioner

## 2022-12-08 ENCOUNTER — Encounter: Payer: Self-pay | Admitting: Nurse Practitioner

## 2022-12-08 VITALS — BP 123/69 | HR 86 | Ht 65.0 in | Wt 231.2 lb

## 2022-12-08 DIAGNOSIS — E782 Mixed hyperlipidemia: Secondary | ICD-10-CM | POA: Diagnosis not present

## 2022-12-08 DIAGNOSIS — Z7985 Long-term (current) use of injectable non-insulin antidiabetic drugs: Secondary | ICD-10-CM | POA: Diagnosis not present

## 2022-12-08 DIAGNOSIS — Z794 Long term (current) use of insulin: Secondary | ICD-10-CM | POA: Diagnosis not present

## 2022-12-08 DIAGNOSIS — E038 Other specified hypothyroidism: Secondary | ICD-10-CM

## 2022-12-08 DIAGNOSIS — E1159 Type 2 diabetes mellitus with other circulatory complications: Secondary | ICD-10-CM

## 2022-12-08 DIAGNOSIS — I10 Essential (primary) hypertension: Secondary | ICD-10-CM | POA: Diagnosis not present

## 2022-12-08 LAB — POCT GLYCOSYLATED HEMOGLOBIN (HGB A1C): Hemoglobin A1C: 7.8 % — AB (ref 4.0–5.6)

## 2022-12-08 MED ORDER — LEVOTHYROXINE SODIUM 137 MCG PO TABS: 137.0000 ug | ORAL_TABLET | Freq: Every day | ORAL | 3 refills | Status: DC

## 2022-12-08 NOTE — Patient Instructions (Signed)

## 2022-12-08 NOTE — Progress Notes (Signed)
12/08/2022   Endocrinology follow-up note   Subjective:    Patient ID: Paul Baker, male    DOB: Sep 26, 1950, PCP [name], MD   Past Medical History:  Diagnosis Date   Arthritis    Asthma    Atrial fibrillation [facility])    Diagnosed December 2021   Colon polyp    Coronary atherosclerosis of native coronary artery    a. 2011: cath showing 90% stenosis along small non-dominant RCA (too small for PCI). b. 01/2018: cath showing nonobstructive CAD with 60 to 70% proximal to mid nondominant RCA stenosis, 50% mid LAD and 40 to 50% OM1   DVT (deep venous thrombosis) (HCC) 2005   Right arm   Essential hypertension    Headache    History of transfusion    Hypothyroidism    Mixed hyperlipidemia    Morbid obesity (HCC)    MRSA (methicillin resistant staph aureus) culture positive    08/2012   Narcolepsy    OSA (obstructive sleep apnea)    CPAP   Osteoarthritis    Pneumonia    Psoriasis    Pulmonary embolism (HCC) 2004   RLS (restless legs syndrome)    Rotator cuff disorder    Left   Septic arthritis of knee, left (HCC)    Skin cancer, basal cell    Type 2 diabetes mellitus (HCC)    Past Surgical History:  Procedure Laterality Date   BACK SURGERY     BIOPSY  01/31/2022   Procedure: BIOPSY;  Surgeon: Lynann Bologna, DO;  Location: [facility];  Service: Gastroenterology;;   CATARACT EXTRACTION Bilateral    COLONOSCOPY     COLONOSCOPY WITH PROPOFOL N/A 01/31/2022   Procedure: COLONOSCOPY WITH PROPOFOL;  Surgeon: Lynann Bologna, DO;  Location: [facility];  Service: Gastroenterology;  Laterality: N/A;   CYST REMOVAL TRUNK     from back   ESOPHAGOGASTRODUODENOSCOPY (EGD) WITH PROPOFOL N/A 01/31/2022   Procedure: ESOPHAGOGASTRODUODENOSCOPY (EGD) WITH PROPOFOL;  Surgeon: Lynann Bologna, DO;  Location: [facility];  Service: Gastroenterology;  Laterality: N/A;   EYE SURGERY Left 2016   laser to left eye   HIP SURGERY     bone removed from both sides of hip    KNEE ARTHROTOMY Right 12/04/2014   Procedure: KNEE ARTHROTOMY PATELLA LIGAMENT RECONSTRUSION AND REPAIR RIGHT KNEE;  Surgeon: Durene Romans, MD;  Location: [facility];  Service: Orthopedics;  Laterality: Right;   KNEE SURGERY     X 25 TIMES   LEFT HEART CATH AND CORONARY ANGIOGRAPHY N/A 01/17/2018   Procedure: LEFT HEART CATH AND CORONARY ANGIOGRAPHY;  Surgeon: Lyn Records, MD;  Location: [facility];  Service: Cardiovascular;  Laterality: N/A;   LUMBAR DISC SURGERY     Left L3, L4, L5 discecotomy with decompression of L4 root   POLYPECTOMY  01/31/2022   Procedure: POLYPECTOMY;  Surgeon: Lynann Bologna, DO;  Location: [facility];  Service: Gastroenterology;;   TONSILLECTOMY     TOTAL KNEE ARTHROPLASTY  2003   LEFT   TOTAL KNEE ARTHROPLASTY Right 03/23/2014   Procedure: RIGHT TOTAL KNEE ARTHROPLASTY AND REMOVAL RIGHT TIBIAL  DEEP IMPLANT STAPLE;  Surgeon: Durene Romans, MD;  Location: [facility];  Service: Orthopedics;  Laterality: Right;   TOTAL KNEE REVISION  2005   LEFT   WRIST SURGERY     Social History   Socioeconomic History   Marital status: Married    Spouse name: Lynnell Dike   Number of children: 2  Years of education: college   Highest education level: Not on file  Occupational History   Occupation: Disabled    Employer: UNEMPLOYED  Tobacco Use   Smoking status: Former    Current packs/day: 0.00    Average packs/day: 1.5 packs/day for 27.5 years (41.3 ttl pk-yrs)    Types: Cigarettes    Start date: 06/19/1967    Quit date: 01/03/1995    Years since quitting: 27.9    Passive exposure: Never   Smokeless tobacco: Never  Vaping Use   Vaping status: Never Used  Substance and Sexual Activity   Alcohol use: No    Alcohol/week: 0.0 standard drinks of alcohol    Comment: quit drinking in 07/86   Drug use: No   Sexual activity: Yes    Partners: Female  Other Topics Concern   Not on file  Social History Narrative    72 year old, right-handed, caucasian male with a past  medical history of obesity, hypertension, hyperlipidemia, diabetes, obstructive sleep apnea, presenting with frequent nighttime awakenings, excessive daytime sleepiness, also transient confusional episodes.RLS and one beosity, OSA on CPAP with AHI of 3.2 and  setting of 16 cm water , [facility] .   Married since 1977.   Social Determinants of Health   Financial Resource Strain: Low Risk  (05/05/2022)   Overall Financial Resource Strain (CARDIA)    Difficulty of Paying Living Expenses: Not hard at all  Food Insecurity: Patient Declined (07/12/2022)   Hunger Vital Sign    Worried About Running Out of Food in the Last Year: Patient declined    Ran Out of Food in the Last Year: Patient declined  Transportation Needs: No Transportation Needs (07/17/2022)   PRAPARE - Administrator, Civil Service (Medical): No    Lack of Transportation (Non-Medical): No  Physical Activity: Insufficiently Active (05/05/2022)   Exercise Vital Sign    Days of Exercise per Week: 3 days    Minutes of Exercise per Session: 30 min  Stress: No Stress Concern Present (05/05/2022)   [employer] of Occupational Health - Occupational Stress Questionnaire    Feeling of Stress : Not at all  Social Connections: Socially Integrated (05/05/2022)   Social Connection and Isolation Panel [NHANES]    Frequency of Communication with Friends and Family: More than three times a week    Frequency of Social Gatherings with Friends and Family: More than three times a week    Attends Religious Services: More than 4 times per year    Active Member of [employer] or Organizations: Yes    Attends Banker Meetings: More than 4 times per year    Marital Status: Married   Outpatient Encounter Medications as of 12/08/2022  Medication Sig   acetaminophen (TYLENOL) 325 MG tablet Take 2 tablets (650 mg total) by mouth every 6 (six) hours as needed for mild pain (or Fever >/= 101). (Patient taking differently: Take 1,000 mg  by mouth every 6 (six) hours as needed for mild pain (pain score 1-3) (or Fever >/= 101).)   albuterol (PROVENTIL) (2.5 MG/3ML) 0.083% nebulizer solution Take 3 mLs (2.5 mg total) by nebulization every 6 (six) hours as needed for up to 7 days for wheezing or shortness of breath.   albuterol (VENTOLIN HFA) 108 (90 Base) MCG/ACT inhaler Inhale 2 puffs into the lungs every 6 (six) hours as needed for wheezing or shortness of breath.   alfuzosin (UROXATRAL) 10 MG 24 hr tablet Take 10 mg by mouth daily.  Alpha Lipoic Acid-Biotin ER 600-450 MG-MCG TBCR Take 600 mg by mouth every morning. (Patient taking differently: Take 600 mg by mouth at bedtime.)   apixaban (ELIQUIS) 5 MG TABS tablet Take 1 tablet (5 mg total) by mouth 2 (two) times daily. For stroke prevention   clobetasol cream (TEMOVATE) 0.05 % Apply thin amount 2 times daily prn to psoriasis not for face axilla or groin   Continuous Glucose Sensor (DEXCOM G7 SENSOR) MISC Change sensor every 10 days   desonide (DESOWEN) 0.05 % cream Apply once daily prn to facial rash   dextromethorphan-guaiFENesin (MUCINEX DM) 30-600 MG 12hr tablet Take 1 tablet by mouth 2 (two) times daily.   ezetimibe (ZETIA) 10 MG tablet Take 1 tablet (10 mg total) by mouth daily.   ferrous sulfate 325 (65 FE) MG EC tablet Take 1 tablet by mouth once daily with breakfast   FIASP FLEXTOUCH 100 UNIT/ML FlexTouch Pen Inject 30-36 Units into the skin in the morning, at noon, and at bedtime.   fluticasone (FLONASE) 50 MCG/ACT nasal spray Place 1 spray into both nostrils daily.   Fluticasone-Umeclidin-Vilant (TRELEGY ELLIPTA) 100-62.5-25 MCG/ACT AEPB Inhale 1 puff into the lungs daily.   Glucagon (GVOKE HYPOPEN 2-PACK) 1 MG/0.2ML SOAJ Inject 1 mg subcutaneously once as needed for low blood sugar. May repeat dose in 15 minutes as needed using a new device.   insulin degludec (TRESIBA FLEXTOUCH) 200 UNIT/ML FlexTouch Pen Inject 120 Units into the skin at bedtime.   Insulin Pen Needle  (B-D ULTRAFINE III SHORT PEN) 31G X 8 MM MISC USE 1 PEN NEEDLE THREE TIMES DAILY   isosorbide mononitrate (IMDUR) 30 MG 24 hr tablet Take 1 tablet (30 mg total) by mouth daily.   loratadine (CLARITIN) 10 MG tablet Take 10 mg by mouth daily as needed for allergies.    losartan (COZAAR) 50 MG tablet Take 50 mg by mouth daily.   metoprolol succinate (TOPROL XL) 25 MG 24 hr tablet Take 1 tablet (25 mg total) by mouth 2 (two) times daily.   modafinil (PROVIGIL) 200 MG tablet Take 1 tablet (200 mg total) by mouth daily.   nitroGLYCERIN (NITROSTAT) 0.4 MG SL tablet DISSOLVE 1 TABLET UNDER TONGUE EVERY 5 MINUTES UP TO 15 MIN FOR CHESTPAIN. IF NO RELIEF CALL 911.   ONE TOUCH ULTRA TEST test strip TEST BLOOD SUGAR UP TO 4 TIMES DAILY.   ONETOUCH DELICA LANCETS 33G MISC USE AS DIRECTED TO TEST BLOOD SUGAR 4 TIMES DAILY.   oxyCODONE-acetaminophen (PERCOCET/ROXICET) 5-325 MG tablet 1 every 4 hours as needed severe pain use sparingly   pravastatin (PRAVACHOL) 40 MG tablet TAKE 1 TABLET BY MOUTH ONCE DAILY IN THE EVENING   rOPINIRole (REQUIP XL) 4 MG 24 hr tablet TAKE 1 TABLET BY MOUTH AT BEDTIME   rOPINIRole (REQUIP) 4 MG tablet Take 0.5 tablets (2 mg total) by mouth 2 (two) times daily.   Semaglutide, 1 MG/DOSE, 4 MG/3ML SOPN Inject 1 mg into the skin once a week.   torsemide (DEMADEX) 20 MG tablet Take 2 or 3 in the AM depending on fluid  retention and 2 at lunch   [DISCONTINUED] levothyroxine (SYNTHROID) 137 MCG tablet TAKE 1 TABLET BY MOUTH ONCE DAILY BEFORE BREAKFAST   levothyroxine (SYNTHROID) 137 MCG tablet Take 1 tablet (137 mcg total) by mouth daily before breakfast.   No facility-administered encounter medications on file as of 12/08/2022.   ALLERGIES: Allergies  Allergen Reactions   Tape Rash    Pulls off skin   Marcelline Deist [  Dapagliflozin]     Yeast infections, felt sick with it   Cardizem [Diltiazem Hcl]     Edema    Cardura [Doxazosin Mesylate]     Headaches / cramps   Crestor [Rosuvastatin]  Other (See Comments)    Leg cramps   Lipitor [Atorvastatin] Other (See Comments)    Leg cramps   Paxil [Paroxetine Hcl]     Unknown reaction    Codeine Rash and Other (See Comments)    Headache    Gabapentin Rash   VACCINATION STATUS: Immunization History  Administered Date(s) Administered   Fluad Quad(high Dose 65+) 11/03/2021   Fluad Trivalent(High Dose 65+) 09/20/2022   Influenza Split 09/18/2012   Influenza,inj,Quad PF,6+ Mos 10/13/2013, 10/06/2014, 09/11/2018   Influenza-Unspecified 12/01/2015, 11/08/2016, 11/02/2020   PFIZER(Purple Top)SARS-COV-2 Vaccination 03/06/2019, 03/27/2019   PNEUMOCOCCAL CONJUGATE-20 02/24/2022   Pneumococcal Polysaccharide-23 10/02/2004, 10/03/2011   Td 05/24/2006    Diabetes He presents for his follow-up diabetic visit. He has type 2 diabetes mellitus. Onset time: Was diagnosed at approximate age of 46 years. His disease course has been improving. There are no hypoglycemic associated symptoms. Pertinent negatives for hypoglycemia include no confusion, headaches, nervousness/anxiousness, pallor, seizures or tremors. Associated symptoms include fatigue and foot paresthesias. Pertinent negatives for diabetes include no chest pain, no polydipsia, no polyphagia, no polyuria, no weakness and no weight loss. There are no hypoglycemic complications. Symptoms are stable. Diabetic complications include a CVA, heart disease, nephropathy and peripheral neuropathy. (Has had 2 mini strokes ) Risk factors for coronary artery disease include diabetes mellitus, dyslipidemia, male sex, obesity, sedentary lifestyle, tobacco exposure and hypertension. Current diabetic treatment includes intensive insulin program (and Ozempic). He is compliant with treatment most of the time. His weight is increasing steadily. He is following a generally healthy diet. When asked about meal planning, he reported none. He has had a previous visit with a dietitian. He never participates in exercise.  His home blood glucose trend is decreasing steadily. His overall blood glucose range is 140-180 mg/dl. (He presents today with his CGM showing improving glycemic profile.  His POCT A1c today was 7.8%, improving from last visit of 8.9%.  Analysis of his CGM shows TIR 56%, TAR 43%, TBR 1% with a GMI of 7.6%.  He notes he did back off on his Ozempic down to 1 mg for a short period of time when he was sick but has plans to increase to the 2 mg once again now that he is feeling better.  He notes mild hypoglycemia overnight. ) An ACE inhibitor/angiotensin II receptor blocker is being taken. He sees a podiatrist.Eye exam is current.  Hyperlipidemia This is a chronic problem. The current episode started more than 1 year ago. The problem is uncontrolled. Recent lipid tests were reviewed and are variable. Exacerbating diseases include chronic renal disease, diabetes, hypothyroidism and obesity. Factors aggravating his hyperlipidemia include fatty foods. Pertinent negatives include no chest pain, myalgias or shortness of breath. Current antihyperlipidemic treatment includes statins. The current treatment provides mild improvement of lipids. Compliance problems include adherence to diet and adherence to exercise.  Risk factors for coronary artery disease include dyslipidemia, diabetes mellitus, hypertension, male sex, obesity and a sedentary lifestyle.  Hypertension This is a chronic problem. The current episode started more than 1 year ago. The problem has been resolved since onset. The problem is controlled. Pertinent negatives include no chest pain, headaches, neck pain, palpitations or shortness of breath. Agents associated with hypertension include thyroid hormones. Risk factors for coronary artery  disease include diabetes mellitus, dyslipidemia, male gender, obesity, sedentary lifestyle and smoking/tobacco exposure. Past treatments include angiotensin blockers, beta blockers and diuretics. Compliance problems include  exercise and diet.  Hypertensive end-organ damage includes kidney disease, CAD/MI and CVA. Identifiable causes of hypertension include chronic renal disease and a thyroid problem.  Thyroid Problem Presents for follow-up visit. Symptoms include fatigue and leg swelling. Patient reports no anxiety, cold intolerance, constipation, depressed mood, diarrhea, heat intolerance, palpitations, tremors, weight gain or weight loss. The symptoms have been stable. Past treatments include levothyroxine. His past medical history is significant for diabetes and hyperlipidemia.     Review of systems  Constitutional: + stable body weight,  current Body mass index is 38.47 kg/m. , no fatigue, no subjective hyperthermia, no subjective hypothermia, reports mild memory impairment since mini strokes Eyes: no blurry vision, no xerophthalmia ENT: no sore throat, no nodules palpated in throat, no dysphagia/odynophagia, no hoarseness Cardiovascular: no chest pain, no shortness of breath, no palpitations, + generalized edema (been hospitalized several times due to CHF exac) Respiratory: + cough- thinks has another URI- will see PCP for this soon, no shortness of breath Gastrointestinal: no nausea/vomiting/diarrhea Musculoskeletal: no muscle/joint aches Skin: no rashes, no hyperemia Neurological: no tremors, no numbness, no tingling, no dizziness, + intermittent numbness/tingling to bilateral feet Psychiatric: no depression, no anxiety    Objective:    BP 123/69 (BP Location: Right Arm, Patient Position: Sitting, Cuff Size: Large)   Pulse 86   Ht 5\' 5"  (1.651 m)   Wt 231 lb 3.2 oz (104.9 kg)   BMI 38.47 kg/m   Wt Readings from Last 3 Encounters:  12/08/22 231 lb 3.2 oz (104.9 kg)  12/06/22 227 lb 6.4 oz (103.1 kg)  11/10/22 223 lb 9.6 oz (101.4 kg)     BP Readings from Last 3 Encounters:  12/08/22 123/69  12/06/22 123/68  11/23/22 114/74     Physical Exam- Limited  Constitutional:  Body mass index is  38.47 kg/m. , not in acute distress, normal state of mind Eyes:  EOMI, no exophthalmos Musculoskeletal: no gross deformities, strength intact in all four extremities, no gross restriction of joint movements Skin:  no rashes, no hyperemia Neurological: no tremor with outstretched hands   Diabetic Foot Exam - Simple   No data filed        Latest Ref Rng & Units 12/04/2022    9:31 AM 11/22/2022    4:23 PM 10/05/2022    4:32 PM  CMP  Glucose 70 - 99 mg/dL 161  096  68   BUN 8 - 27 mg/dL 39  36  41   Creatinine 0.76 - 1.27 mg/dL 0.45  4.09  8.11   Sodium 134 - 144 mmol/L 139  141  144   Potassium 3.5 - 5.2 mmol/L 4.6  4.6  4.1   Chloride 96 - 106 mmol/L 100  101  100   CO2 20 - 29 mmol/L 23  26  27    Calcium 8.6 - 10.2 mg/dL 9.6  9.4  9.3   Total Protein 6.0 - 8.5 g/dL 7.0   7.1   Total Bilirubin 0.0 - 1.2 mg/dL 0.2   <9.1   Alkaline Phos 44 - 121 IU/L 88   84   AST 0 - 40 IU/L 11   17   ALT 0 - 44 IU/L 13   14      Diabetic Labs (most recent): Lab Results  Component Value Date   HGBA1C 7.8 (A) 12/08/2022  HGBA1C 8.9 (H) 07/11/2022   HGBA1C 8.9 (A) 04/03/2022   MICROALBUR 10 06/09/2021   MICROALBUR 9.5 02/06/2019   MICROALBUR 4.6 10/08/2017   Lipid Panel     Component Value Date/Time   CHOL 150 05/05/2022 0938   TRIG 206 (H) 05/05/2022 0938   HDL 43 05/05/2022 0938   CHOLHDL 3.5 05/05/2022 0938   CHOLHDL 3.2 08/05/2019 0334   VLDL 19 08/05/2019 0334   LDLCALC 73 05/05/2022 0938   LDLCALC 118 (H) 02/06/2019 0813     Assessment & Plan:   1) Uncontrolled type 2 diabetes mellitus with circulatory complication, with long-term current use of insulin (HCC)  -His diabetes is complicated by coronary artery disease and patient remains at an extremely high risk for more acute and chronic complications of diabetes which include CAD, CVA, CKD, retinopathy, and neuropathy. These are all discussed in detail with the patient.  He presents today with his CGM showing  improving glycemic profile.  His POCT A1c today was 7.8%, improving from last visit of 8.9%.  Analysis of his CGM shows TIR 56%, TAR 43%, TBR 1% with a GMI of 7.6%.  He notes he did back off on his Ozempic down to 1 mg for a short period of time when he was sick but has plans to increase to the 2 mg once again now that he is feeling better.  He notes mild hypoglycemia overnight.     Glucose logs and insulin administration records pertaining to this visit,  to be scanned into patient's records.  Recent labs reviewed.  - Nutritional counseling repeated at each appointment due to patients tendency to fall back in to old habits.  - The patient admits there is a room for improvement in their diet and drink choices. -  Suggestion is made for the patient to avoid simple carbohydrates from their diet including Cakes, Sweet Desserts / Pastries, Ice Cream, Soda (diet and regular), Sweet Tea, Candies, Chips, Cookies, Sweet Pastries, Store Bought Juices, Alcohol in Excess of 1-2 drinks a day, Artificial Sweeteners, Coffee Creamer, and "Sugar-free" Products. This will help patient to have stable blood glucose profile and potentially avoid unintended weight gain.   - I encouraged the patient to switch to unprocessed or minimally processed complex starch and increased protein intake (animal or plant source), fruits, and vegetables.   - Patient is advised to stick to a routine mealtimes to eat 3 meals a day and avoid unnecessary snacks (to snack only to correct hypoglycemia).  - I have approached patient with the following individualized plan to manage diabetes and patient agrees.  -He is advised to lower his Evaristo Bury slightly to 110 units SQ nightly, continue Fiasp 30-36 units SQ TID with meals if glucose is above 90 and he is eating (Specific instructions on how to titrate insulin dosage based on glucose readings given to patient in writing).  Will also continue his Ozempic 2 mg SQ weekly as well.  -He is  encouraged to continue using his CGM to monitor glucose 4 times daily, before meals and before bed, and to call the clinic if he has readings less than 70 or greater than 300 for 3 tests in a row.  2) BP/HTN:  His blood pressure is controlled to target for his age.  He is advised to continue Lasix 40 mg po daily, Losartan 50 mg po daily, Norvasc 5 mg po daily, and Metoprolol 75 mg po twice daily.  Will defer changes to cardiology.  3) Lipids/HPL:  His most  recent lipid panel from 05/05/22 shows controlled LDL of 73and elevated triglycerides of 206.  He is advised to continue Pravastatin 40 mg po daily at bedtime.  Side effects and precautions discussed with him.    4)  Weight/Diet:  His Body mass index is 38.47 kg/m.-complicating his diabetes care.  He is a candidate for modest weight loss.  CDE consult in progress, exercise, and carbohydrates information provided.  5) Hypothyroidism -His previsit TFTs are consistent with appropriate hormone replacement.  He is advised to continue current dose of Levothyroxine at 137 mcg po daily before breakfast.    - We discussed about the correct intake of his thyroid hormone, on empty stomach at fasting, with water, separated by at least 30 minutes from breakfast and other medications,  and separated by more than 4 hours from calcium, iron, multivitamins, acid reflux medications (PPIs). -Patient is made aware of the fact that thyroid hormone replacement is needed for life, dose to be adjusted by periodic monitoring of thyroid function tests.  6) Chronic Care/Health Maintenance: -Patient is on ACEI/ARB and Statin medications and encouraged to continue to follow up with Ophthalmology, Podiatrist at least yearly or according to recommendations, and advised to  stay away from smoking. I have recommended yearly flu vaccine and pneumonia vaccination at least every 5 years; moderate intensity exercise for up to 150 minutes weekly; and  sleep for at least 7 hours a  day.  - I advised patient to maintain close follow up with [name], MD for primary care needs.     I spent  30  minutes in the care of the patient today including review of labs from CMP, Lipids, Thyroid Function, Hematology (current and previous including abstractions from other facilities); face-to-face time discussing  his blood glucose readings/logs, discussing hypoglycemia and hyperglycemia episodes and symptoms, medications doses, his options of short and long term treatment based on the latest standards of care / guidelines;  discussion about incorporating lifestyle medicine;  and documenting the encounter. Risk reduction counseling performed per USPSTF guidelines to reduce obesity and cardiovascular risk factors.     Please refer to Patient Instructions for Blood Glucose Monitoring and Insulin/Medications Dosing Guide"  in media tab for additional information. Please  also refer to " Patient Self Inventory" in the Media  tab for reviewed elements of pertinent patient history.  Paul Baker participated in the discussions, expressed understanding, and voiced agreement with the above plans.  All questions were answered to his satisfaction. he is encouraged to contact clinic should he have any questions or concerns prior to his return visit.   Follow up plan: -Return in about 4 months (around 04/08/2023) for Diabetes F/U with A1c in office, No previsit labs, Bring meter and logs.  Ronny Bacon, [facility] [address] [place], [place] [address] Phone: [phone] Fax: [phone]  12/08/2022, 9:25 AM

## 2022-12-13 ENCOUNTER — Other Ambulatory Visit: Payer: Self-pay

## 2022-12-13 DIAGNOSIS — R222 Localized swelling, mass and lump, trunk: Secondary | ICD-10-CM

## 2022-12-14 ENCOUNTER — Ambulatory Visit (HOSPITAL_COMMUNITY)
Admission: RE | Admit: 2022-12-14 | Discharge: 2022-12-14 | Disposition: A | Discharge status: Home / Self Care | Payer: Medicare HMO | Source: Ambulatory Visit | Attending: Family Medicine | Admitting: Family Medicine

## 2022-12-14 DIAGNOSIS — R222 Localized swelling, mass and lump, trunk: Secondary | ICD-10-CM | POA: Insufficient documentation

## 2022-12-14 DIAGNOSIS — R221 Localized swelling, mass and lump, neck: Secondary | ICD-10-CM | POA: Diagnosis not present

## 2022-12-19 DIAGNOSIS — H52223 Regular astigmatism, bilateral: Secondary | ICD-10-CM | POA: Diagnosis not present

## 2022-12-21 ENCOUNTER — Telehealth: Payer: Self-pay | Admitting: Nurse Practitioner

## 2022-12-21 NOTE — Telephone Encounter (Signed)
Lvm and sent mychart message to let pt know pt assistance was ready to be picked up

## 2022-12-21 NOTE — Telephone Encounter (Signed)
Pt picked up pt assistance.

## 2022-12-22 ENCOUNTER — Ambulatory Visit: Payer: Medicare HMO | Attending: Cardiology | Admitting: Cardiology

## 2022-12-22 ENCOUNTER — Encounter: Payer: Self-pay | Admitting: Cardiology

## 2022-12-22 VITALS — BP 102/64 | HR 85 | Ht 65.0 in | Wt 226.4 lb

## 2022-12-22 DIAGNOSIS — I5032 Chronic diastolic (congestive) heart failure: Secondary | ICD-10-CM | POA: Diagnosis not present

## 2022-12-22 DIAGNOSIS — I480 Paroxysmal atrial fibrillation: Secondary | ICD-10-CM | POA: Diagnosis not present

## 2022-12-22 DIAGNOSIS — Z79899 Other long term (current) drug therapy: Secondary | ICD-10-CM | POA: Diagnosis not present

## 2022-12-22 DIAGNOSIS — I25119 Atherosclerotic heart disease of native coronary artery with unspecified angina pectoris: Secondary | ICD-10-CM

## 2022-12-22 MED ORDER — TORSEMIDE 20 MG PO TABS: 60.0000 mg | ORAL_TABLET | Freq: Every morning | ORAL | 1 refills | Status: DC

## 2022-12-22 NOTE — Patient Instructions (Addendum)
Medication Instructions:  Your physician has recommended you make the following change in your medication:  Take torsemide 60 mg in the morning and 40 mg in the evening Continue all other medications as prescribed  Labwork: BMET on 01/01/2023 at [facility]  Non-fasting  Testing/Procedures: none  Follow-Up: Your physician recommends that you schedule a follow-up appointment in: 6 months  Any Other Special Instructions Will Be Listed Below (If Applicable).  If you need a refill on your cardiac medications before your next appointment, please call your pharmacy.

## 2022-12-22 NOTE — Progress Notes (Signed)
    Cardiology Office Note  Date: 12/22/2022   ID: DANDRE RABEL, DOB 03/18/1950, MRN [id number]  History of Present Illness: JENNIFER QUINTOS is a 72 y.o. male last seen in July.  He is here for a follow-up visit.  Reports no increasing angina, NYHA class II-III dyspnea depending on level of activity.  Does have intermittent leg edema, weight has been relatively stable.  No palpitations or syncope.  I reviewed his medications.  Current cardiac regimen includes Eliquis, Imdur, Cozaar, Toprol XL, Pravachol, Ozempic, Demadex, and as needed nitroglycerin.  We discussed up titration of Demadex with follow-up lab work.  Physical Exam: VS:  BP 102/64 (BP Location: Left Arm)   Pulse 85   Ht 5\' 5"  (1.651 m)   Wt 226 lb 6.4 oz (102.7 kg)   SpO2 96%   BMI 37.67 kg/m , BMI Body mass index is 37.67 kg/m.  Wt Readings from Last 3 Encounters:  12/22/22 226 lb 6.4 oz (102.7 kg)  12/08/22 231 lb 3.2 oz (104.9 kg)  12/06/22 227 lb 6.4 oz (103.1 kg)    General: Patient appears comfortable at rest. HEENT: Conjunctiva and lids normal. Neck: Supple, no elevated JVP or carotid bruits. Lungs: Clear to auscultation, nonlabored breathing at rest. Cardiac: Regular rate and rhythm, no S3 or significant systolic murmur. Extremities: 1-2+ edema.  ECG:  An ECG dated 07/11/2022 was personally reviewed today and demonstrated:  Sinus rhythm, rule out old inferior infarct pattern.  Labwork: 07/11/2022: Magnesium 2.2 10/05/2022: Hemoglobin 11.7; Platelets 308 11/22/2022: BNP 33.1 12/04/2022: ALT 13; AST 11; BUN 39; Creatinine, Ser 1.34; Potassium 4.6; Sodium 139; TSH 1.170     Component Value Date/Time   CHOL 150 05/05/2022 0938   TRIG 206 (H) 05/05/2022 0938   HDL 43 05/05/2022 0938   CHOLHDL 3.5 05/05/2022 0938   CHOLHDL 3.2 08/05/2019 0334   VLDL 19 08/05/2019 0334   LDLCALC 73 05/05/2022 0938   LDLCALC 118 (H) 02/06/2019 0813   Other Studies Reviewed Today:  No interval cardiac testing for  review today.  Assessment and Plan:  1.  CAD, moderate disease by coronary CTA with reassuring FFR analysis in April.  He does have prior documented nondominant RCA stenosis that was felt to be best managed medically given very small caliber vessel.  No progressive angina at this time.  Continue Pravachol, Ozempic, and as needed nitroglycerin.   2.  HFpEF, echocardiogram in May revealed LVEF 60 to 65%, RV contraction normal.  Plan to increase Demadex to 60 mg in the morning and 40 mg in the afternoon, follow-up BMET in 10 days.  At present he is not on a potassium supplement.  Continue Ozempic, he did not tolerate [place] previously.   3.  Paroxysmal atrial fibrillation with CHA2DS2-VASc score of 4.  No increasing palpitations.  He remains on Eliquis for stroke prophylaxis along with Toprol-XL.   4.  Primary hypertension.  Blood pressure is well-controlled today.   5.  CKD stage II-III.  Creatinine 1.34 in December.  Disposition:  Follow up  6 months.  Signed, Jonelle Sidle, M.D., F.A.C.C. [facility]

## 2022-12-23 DIAGNOSIS — E1165 Type 2 diabetes mellitus with hyperglycemia: Secondary | ICD-10-CM | POA: Diagnosis not present

## 2022-12-25 ENCOUNTER — Other Ambulatory Visit: Payer: Self-pay | Admitting: Nurse Practitioner

## 2022-12-25 MED ORDER — METOPROLOL SUCCINATE ER 25 MG PO TB24: 25.0000 mg | ORAL_TABLET | Freq: Two times a day (BID) | ORAL | 1 refills | Status: DC

## 2023-01-01 DIAGNOSIS — Z79899 Other long term (current) drug therapy: Secondary | ICD-10-CM | POA: Diagnosis not present

## 2023-01-01 DIAGNOSIS — I5032 Chronic diastolic (congestive) heart failure: Secondary | ICD-10-CM | POA: Diagnosis not present

## 2023-01-02 LAB — BASIC METABOLIC PANEL
BUN/Creatinine Ratio: 26 — ABNORMAL HIGH (ref 10–24)
BUN: 35 mg/dL — ABNORMAL HIGH (ref 8–27)
CO2: 21 mmol/L (ref 20–29)
Calcium: 9.3 mg/dL (ref 8.6–10.2)
Chloride: 103 mmol/L (ref 96–106)
Creatinine, Ser: 1.36 mg/dL — ABNORMAL HIGH (ref 0.76–1.27)
Glucose: 179 mg/dL — ABNORMAL HIGH (ref 70–99)
Potassium: 4.7 mmol/L (ref 3.5–5.2)
Sodium: 141 mmol/L (ref 134–144)
eGFR: 56 mL/min/{1.73_m2} — ABNORMAL LOW (ref >59)

## 2023-01-08 ENCOUNTER — Ambulatory Visit: Payer: Self-pay | Admitting: *Deleted

## 2023-01-08 NOTE — Patient Instructions (Addendum)
 Visit Information  Thank you for taking time to visit with me today. Please don't hesitate to contact me if I can be of assistance to you.   Following are the goals we discussed today:   Goals Addressed             This Visit's Progress    Home management of diabetes & congestive heart failure- be more active or compression hose RN CM care coordination services   Not on track    Since 2024 his hypoglycemic episodes have decrease after a change in insulin  and drinking a cup of milk before bed.   Constipation symptoms have been resolved Managing diabetes and congestive heart failure better    Interventions Today    Flowsheet Row Most Recent Value  Chronic Disease   Chronic disease during today's visit Diabetes, Congestive Heart Failure (CHF), Other  [insomnia, chronic pain, RLS, CPAP/sleep apnea retesting]  General Interventions   General Interventions Discussed/Reviewed General Interventions Reviewed, Labs, Durable Medical Equipment (DME), Doctor Visits  Labs --  [place] of december 2024 ultrasound of shoulder]  Doctor Visits Discussed/Reviewed Doctor Visits Reviewed, PCP, Specialist  [pcp, neurology, pain managment]  Durable Medical Equipment (DME) Other  [CPAP use and cleaning assessed]  PCP/Specialist Visits Compliance with follow-up visit  Education Interventions   Education Provided Provided Web-based Education, Provided Education  Provided Verbal Education On Labs, Mental Health/Coping with Illness, Medication, When to see the doctor, [facility], Other  [CPAP, insomina, Restless leg syndrome, drug interaction checker, mouth breathing, irriation/pressue on nerve, protein snack to prevent hypoglycemia, lipoma, shoulder exercises]  Labs Reviewed --  sammi dopp of right shoulder]  Mental Health Interventions   Mental Health Discussed/Reviewed Mental Health Reviewed, Coping Strategies  Nutrition Interventions   Nutrition Discussed/Reviewed Nutrition Reviewed,  Increasing proteins  Pharmacy Interventions   Pharmacy Dicussed/Reviewed Pharmacy Topics Reviewed, Medications and their functions, Affording Medications                Our next appointment is by telephone on 04/09/23 at 1 pm  Please call the care guide team at [phone] if you need to cancel or reschedule your appointment.   If you are experiencing a Mental Health or Behavioral Health Crisis or need someone to talk to, please call the Suicide and Crisis Lifeline: 988 call the [place]  National Suicide Prevention Lifeline: 609 105 7476 or TTY: (249)631-1004 TTY 9493923392) to talk to a trained counselor call 1-800-273-TALK (toll free, 24 hour hotline) call the [facility]: 780-349-8890 call 911   Patient verbalizes understanding of instructions and care plan provided today and agrees to view in MyChart. Active MyChart status and patient understanding of how to access instructions and care plan via MyChart confirmed with patient.     The patient has been provided with contact information for the care management team and has been advised to call with any health related questions or concerns.   Meena Barrantes L. Ramonita, RN, BSN, [facility]  VBCI Care Management Coordinator  [phone]  Fax: [phone]

## 2023-01-08 NOTE — Patient Outreach (Signed)
 Care Coordination   Follow Up Visit Note   01/08/2023 Name: Paul Baker MRN: [id number] DOB: Dec 02, 1950  Paul Baker is a 73 y.o. year old male who sees Luking, Glendia LABOR, MD for primary care. I spoke with  Paul Baker by phone today.  What matters to the patients health and wellness today?  Since the last outreach with Paul Baker in November 2025, he has had some improvements with hypoglycemia & constipation. Today he reports he is "not so good" related to poor sleep.  He still voices concern with lower back & right shoulder pain, jerking of his legs while sleeping, and insomnia.  He did have an ultrasound ordered by his pcp of his right supraclavicular area with an IMPRESSION: The palpable abnormality corresponds to 2 adjacent rounded soft tissue lesions with imaging characteristics most consistent with benign lipomas..   He has followed by Dr Chalice, neurologist, for five to 10 years.  His last visit was in May 2024 with recommendation to return in 6 months. With assessment he confirms issues with chronic pain, shortness of breath, restless leg syndrome (RLS), sleep apnea, possible mouth breather that can interrupt sleep patterns. He denies issues with incontinence, stress, trauma, use of caffeine/stimulants. His last neurology visit indicated day naps. He reports issues with waking up at night if he rolls on his right shoulder or his legs jerk and jump at night. He reports the present pain medications and treatment is not as effective as it previously was. He reports he is aware that his poor sleep worsens his other symptoms (pain, RLS). He has obstructive sleep apnea and confirms he uses his CPAP every night. He discussed that Dr Chalice would like for him to have another sleep study. He agrees to collaborate with his MD, obtain an appointment. He voiced understanding of sleep hygiene and restless leg syndrome information.   He reports he will review web-based education  discussed & provided by RN CM. He states he will use the drug interaction checker or his local pharmacist to help determine if any of his medications may be causing poor sleep.   Since 2024 his hypoglycemic episodes have decrease after a change in insulin  and drinking a cup of milk before bed.  Constipation symptoms have been resolved.  He agrees with further follow up every three months and outreach to RN CM as needed.   Goals Addressed             This Visit's Progress    Home management of diabetes & congestive heart failure- be more active or compression hose RN CM care coordination services   Not on track    Since 2024 his hypoglycemic episodes have decrease after a change in insulin  and drinking a cup of milk before bed.   Constipation symptoms have been resolved Managing diabetes and congestive heart failure better    Interventions Today    Flowsheet Row Most Recent Value  Chronic Disease   Chronic disease during today's visit Diabetes, Congestive Heart Failure (CHF), Other  [insomnia, chronic pain, RLS, CPAP/sleep apnea retesting]  General Interventions   General Interventions Discussed/Reviewed General Interventions Reviewed, Labs, Durable Medical Equipment (DME), Doctor Visits  Labs --  [place] of december 2024 ultrasound of shoulder]  Doctor Visits Discussed/Reviewed Doctor Visits Reviewed, PCP, Specialist  [pcp, neurology, pain managment]  Durable Medical Equipment (DME) Other  [CPAP use and cleaning assessed]  PCP/Specialist Visits Compliance with follow-up visit  Education Interventions   Education Provided Provided Web-based Education,  Provided Education  Provided Verbal Education On Labs, Mental Health/Coping with Illness, Medication, When to see the doctor, [facility], Other  [CPAP, insomina, Restless leg syndrome, drug interaction checker, mouth breathing, irriation/pressue on nerve, protein snack to prevent hypoglycemia, lipoma, shoulder exercises]  Labs  Reviewed --  sammi dopp of right shoulder]  Mental Health Interventions   Mental Health Discussed/Reviewed Mental Health Reviewed, Coping Strategies  Nutrition Interventions   Nutrition Discussed/Reviewed Nutrition Reviewed, Increasing proteins  Pharmacy Interventions   Pharmacy Dicussed/Reviewed Pharmacy Topics Reviewed, Medications and their functions, Affording Medications                SDOH assessments and interventions completed:  No     Care Coordination Interventions:  Yes, provided   Follow up plan: Follow up call scheduled for 04/09/23    Encounter Outcome:  Patient Visit Completed   Suzen L. Ramonita, RN, BSN, [facility]  VBCI Care Management Coordinator  [phone]  Fax: [phone]

## 2023-01-15 ENCOUNTER — Other Ambulatory Visit: Payer: Self-pay | Admitting: Neurology

## 2023-01-15 DIAGNOSIS — J40 Bronchitis, not specified as acute or chronic: Secondary | ICD-10-CM | POA: Diagnosis not present

## 2023-01-15 DIAGNOSIS — I4891 Unspecified atrial fibrillation: Secondary | ICD-10-CM | POA: Diagnosis not present

## 2023-01-15 DIAGNOSIS — R609 Edema, unspecified: Secondary | ICD-10-CM | POA: Diagnosis not present

## 2023-01-15 DIAGNOSIS — R42 Dizziness and giddiness: Secondary | ICD-10-CM | POA: Diagnosis not present

## 2023-01-15 DIAGNOSIS — I129 Hypertensive chronic kidney disease with stage 1 through stage 4 chronic kidney disease, or unspecified chronic kidney disease: Secondary | ICD-10-CM | POA: Diagnosis not present

## 2023-01-15 DIAGNOSIS — E1122 Type 2 diabetes mellitus with diabetic chronic kidney disease: Secondary | ICD-10-CM | POA: Diagnosis not present

## 2023-01-15 DIAGNOSIS — Z6836 Body mass index (BMI) 36.0-36.9, adult: Secondary | ICD-10-CM | POA: Diagnosis not present

## 2023-01-15 DIAGNOSIS — G2581 Restless legs syndrome: Secondary | ICD-10-CM

## 2023-01-15 DIAGNOSIS — N182 Chronic kidney disease, stage 2 (mild): Secondary | ICD-10-CM | POA: Diagnosis not present

## 2023-01-16 NOTE — Telephone Encounter (Signed)
 Last seen on 05/04/22 No follow up scheduled

## 2023-01-20 ENCOUNTER — Other Ambulatory Visit: Payer: Self-pay | Admitting: Cardiology

## 2023-01-23 ENCOUNTER — Other Ambulatory Visit: Payer: Self-pay

## 2023-01-23 DIAGNOSIS — E1165 Type 2 diabetes mellitus with hyperglycemia: Secondary | ICD-10-CM | POA: Diagnosis not present

## 2023-01-23 MED ORDER — MODAFINIL 200 MG PO TABS: 200.0000 mg | ORAL_TABLET | Freq: Every day | ORAL | 5 refills | Status: DC

## 2023-01-23 NOTE — Progress Notes (Unsigned)
Refilled Nuvigil-  with 5 refills. We need to have a brief virtual visit at 6 months mark, and I like for the patient to be seen alternating face to face  with the virtual visit.   Can NP see patient briefly for this med refill. ? Virtually.  Melvyn Novas, MD

## 2023-01-24 ENCOUNTER — Telehealth: Payer: Self-pay | Admitting: Family Medicine

## 2023-01-24 ENCOUNTER — Other Ambulatory Visit: Payer: Self-pay

## 2023-01-24 DIAGNOSIS — D649 Anemia, unspecified: Secondary | ICD-10-CM

## 2023-01-24 NOTE — Telephone Encounter (Signed)
LVM and sent mychart msg informing pt of need to schedule follow up with [place].  If pt calls back you can offer him a slot with Amy for 01/25/23 at 9am

## 2023-01-25 ENCOUNTER — Telehealth: Payer: Self-pay | Admitting: Nurse Practitioner

## 2023-01-25 ENCOUNTER — Inpatient Hospital Stay: Payer: Medicare HMO | Attending: Hematology

## 2023-01-25 DIAGNOSIS — M25559 Pain in unspecified hip: Secondary | ICD-10-CM | POA: Insufficient documentation

## 2023-01-25 DIAGNOSIS — R1310 Dysphagia, unspecified: Secondary | ICD-10-CM | POA: Insufficient documentation

## 2023-01-25 DIAGNOSIS — Z86711 Personal history of pulmonary embolism: Secondary | ICD-10-CM | POA: Diagnosis not present

## 2023-01-25 DIAGNOSIS — I509 Heart failure, unspecified: Secondary | ICD-10-CM | POA: Diagnosis not present

## 2023-01-25 DIAGNOSIS — Z800 Family history of malignant neoplasm of digestive organs: Secondary | ICD-10-CM | POA: Insufficient documentation

## 2023-01-25 DIAGNOSIS — Z8249 Family history of ischemic heart disease and other diseases of the circulatory system: Secondary | ICD-10-CM | POA: Insufficient documentation

## 2023-01-25 DIAGNOSIS — R079 Chest pain, unspecified: Secondary | ICD-10-CM | POA: Insufficient documentation

## 2023-01-25 DIAGNOSIS — R0602 Shortness of breath: Secondary | ICD-10-CM | POA: Insufficient documentation

## 2023-01-25 DIAGNOSIS — Z79899 Other long term (current) drug therapy: Secondary | ICD-10-CM | POA: Diagnosis not present

## 2023-01-25 DIAGNOSIS — Z833 Family history of diabetes mellitus: Secondary | ICD-10-CM | POA: Insufficient documentation

## 2023-01-25 DIAGNOSIS — E782 Mixed hyperlipidemia: Secondary | ICD-10-CM | POA: Diagnosis not present

## 2023-01-25 DIAGNOSIS — Z820 Family history of epilepsy and other diseases of the nervous system: Secondary | ICD-10-CM | POA: Insufficient documentation

## 2023-01-25 DIAGNOSIS — I2510 Atherosclerotic heart disease of native coronary artery without angina pectoris: Secondary | ICD-10-CM | POA: Insufficient documentation

## 2023-01-25 DIAGNOSIS — D649 Anemia, unspecified: Secondary | ICD-10-CM | POA: Insufficient documentation

## 2023-01-25 DIAGNOSIS — Z888 Allergy status to other drugs, medicaments and biological substances status: Secondary | ICD-10-CM | POA: Insufficient documentation

## 2023-01-25 DIAGNOSIS — R768 Other specified abnormal immunological findings in serum: Secondary | ICD-10-CM | POA: Diagnosis not present

## 2023-01-25 DIAGNOSIS — Z885 Allergy status to narcotic agent status: Secondary | ICD-10-CM | POA: Insufficient documentation

## 2023-01-25 DIAGNOSIS — Z841 Family history of disorders of kidney and ureter: Secondary | ICD-10-CM | POA: Insufficient documentation

## 2023-01-25 DIAGNOSIS — Z85828 Personal history of other malignant neoplasm of skin: Secondary | ICD-10-CM | POA: Diagnosis not present

## 2023-01-25 DIAGNOSIS — E119 Type 2 diabetes mellitus without complications: Secondary | ICD-10-CM | POA: Diagnosis not present

## 2023-01-25 DIAGNOSIS — R110 Nausea: Secondary | ICD-10-CM | POA: Diagnosis not present

## 2023-01-25 DIAGNOSIS — R002 Palpitations: Secondary | ICD-10-CM | POA: Insufficient documentation

## 2023-01-25 DIAGNOSIS — R200 Anesthesia of skin: Secondary | ICD-10-CM | POA: Diagnosis not present

## 2023-01-25 DIAGNOSIS — E039 Hypothyroidism, unspecified: Secondary | ICD-10-CM | POA: Diagnosis not present

## 2023-01-25 DIAGNOSIS — I4891 Unspecified atrial fibrillation: Secondary | ICD-10-CM | POA: Diagnosis not present

## 2023-01-25 DIAGNOSIS — J45909 Unspecified asthma, uncomplicated: Secondary | ICD-10-CM | POA: Insufficient documentation

## 2023-01-25 DIAGNOSIS — Z87891 Personal history of nicotine dependence: Secondary | ICD-10-CM | POA: Insufficient documentation

## 2023-01-25 DIAGNOSIS — Z86718 Personal history of other venous thrombosis and embolism: Secondary | ICD-10-CM | POA: Diagnosis not present

## 2023-01-25 DIAGNOSIS — G4733 Obstructive sleep apnea (adult) (pediatric): Secondary | ICD-10-CM | POA: Diagnosis not present

## 2023-01-25 DIAGNOSIS — Z860100 Personal history of colon polyps, unspecified: Secondary | ICD-10-CM | POA: Insufficient documentation

## 2023-01-25 DIAGNOSIS — Z9089 Acquired absence of other organs: Secondary | ICD-10-CM | POA: Insufficient documentation

## 2023-01-25 DIAGNOSIS — E785 Hyperlipidemia, unspecified: Secondary | ICD-10-CM | POA: Diagnosis not present

## 2023-01-25 DIAGNOSIS — R519 Headache, unspecified: Secondary | ICD-10-CM | POA: Diagnosis not present

## 2023-01-25 LAB — COMPREHENSIVE METABOLIC PANEL
ALT: 19 U/L (ref 0–44)
AST: 21 U/L (ref 15–41)
Albumin: 3.9 g/dL (ref 3.5–5.0)
Alkaline Phosphatase: 58 U/L (ref 38–126)
Anion gap: 10 (ref 5–15)
BUN: 45 mg/dL — ABNORMAL HIGH (ref 8–23)
CO2: 25 mmol/L (ref 22–32)
Calcium: 9 mg/dL (ref 8.9–10.3)
Chloride: 103 mmol/L (ref 98–111)
Creatinine, Ser: 1.37 mg/dL — ABNORMAL HIGH (ref 0.61–1.24)
GFR, Estimated: 55 mL/min — ABNORMAL LOW (ref >60)
Glucose, Bld: 171 mg/dL — ABNORMAL HIGH (ref 70–99)
Potassium: 4.7 mmol/L (ref 3.5–5.1)
Sodium: 138 mmol/L (ref 135–145)
Total Bilirubin: 0.4 mg/dL (ref 0.0–1.2)
Total Protein: 7.4 g/dL (ref 6.5–8.1)

## 2023-01-25 LAB — CBC WITH DIFFERENTIAL/PLATELET
Abs Immature Granulocytes: 0.04 10*3/uL (ref 0.00–0.07)
Basophils Absolute: 0.1 10*3/uL (ref 0.0–0.1)
Basophils Relative: 1 %
Eosinophils Absolute: 0.2 10*3/uL (ref 0.0–0.5)
Eosinophils Relative: 3 %
HCT: 34.4 % — ABNORMAL LOW (ref 39.0–52.0)
Hemoglobin: 10.8 g/dL — ABNORMAL LOW (ref 13.0–17.0)
Immature Granulocytes: 0 %
Lymphocytes Relative: 16 %
Lymphs Abs: 1.5 10*3/uL (ref 0.7–4.0)
MCH: 28.9 pg (ref 26.0–34.0)
MCHC: 31.4 g/dL (ref 30.0–36.0)
MCV: 92 fL (ref 80.0–100.0)
Monocytes Absolute: 1 10*3/uL (ref 0.1–1.0)
Monocytes Relative: 11 %
Neutro Abs: 6.1 10*3/uL (ref 1.7–7.7)
Neutrophils Relative %: 69 %
Platelets: 229 10*3/uL (ref 150–400)
RBC: 3.74 MIL/uL — ABNORMAL LOW (ref 4.22–5.81)
RDW: 14.8 % (ref 11.5–15.5)
WBC: 8.9 10*3/uL (ref 4.0–10.5)
nRBC: 0 % (ref 0.0–0.2)

## 2023-01-25 LAB — FERRITIN: Ferritin: 112 ng/mL (ref 24–336)

## 2023-01-25 LAB — IRON AND TIBC
Iron: 55 ug/dL (ref 45–182)
Saturation Ratios: 17 % — ABNORMAL LOW (ref 17.9–39.5)
TIBC: 318 ug/dL (ref 250–450)
UIBC: 263 ug/dL

## 2023-01-25 NOTE — Telephone Encounter (Signed)
Pt returning call to take offered opening. Advised that the message was left yesterday and the appt would have been today. Requesting call back with next availability.

## 2023-01-25 NOTE — Telephone Encounter (Signed)
2025 re enrollment for [employer] PAP was mailed to pt

## 2023-01-30 NOTE — Telephone Encounter (Signed)
Pt scheduled next available appt 08/06/23 at 1:30 pm

## 2023-02-01 ENCOUNTER — Inpatient Hospital Stay: Payer: Medicare HMO | Admitting: Oncology

## 2023-02-01 VITALS — BP 124/68 | HR 88 | Temp 97.4°F | Resp 18 | Wt 225.5 lb

## 2023-02-01 DIAGNOSIS — R1310 Dysphagia, unspecified: Secondary | ICD-10-CM | POA: Diagnosis not present

## 2023-02-01 DIAGNOSIS — Z85828 Personal history of other malignant neoplasm of skin: Secondary | ICD-10-CM | POA: Diagnosis not present

## 2023-02-01 DIAGNOSIS — I4891 Unspecified atrial fibrillation: Secondary | ICD-10-CM | POA: Diagnosis not present

## 2023-02-01 DIAGNOSIS — R768 Other specified abnormal immunological findings in serum: Secondary | ICD-10-CM | POA: Diagnosis not present

## 2023-02-01 DIAGNOSIS — Z79899 Other long term (current) drug therapy: Secondary | ICD-10-CM | POA: Diagnosis not present

## 2023-02-01 DIAGNOSIS — M25559 Pain in unspecified hip: Secondary | ICD-10-CM | POA: Diagnosis not present

## 2023-02-01 DIAGNOSIS — E782 Mixed hyperlipidemia: Secondary | ICD-10-CM | POA: Diagnosis not present

## 2023-02-01 DIAGNOSIS — R110 Nausea: Secondary | ICD-10-CM | POA: Diagnosis not present

## 2023-02-01 DIAGNOSIS — I2510 Atherosclerotic heart disease of native coronary artery without angina pectoris: Secondary | ICD-10-CM | POA: Diagnosis not present

## 2023-02-01 DIAGNOSIS — Z860100 Personal history of colon polyps, unspecified: Secondary | ICD-10-CM | POA: Diagnosis not present

## 2023-02-01 DIAGNOSIS — I509 Heart failure, unspecified: Secondary | ICD-10-CM | POA: Diagnosis not present

## 2023-02-01 DIAGNOSIS — R0602 Shortness of breath: Secondary | ICD-10-CM | POA: Diagnosis not present

## 2023-02-01 DIAGNOSIS — R079 Chest pain, unspecified: Secondary | ICD-10-CM | POA: Diagnosis not present

## 2023-02-01 DIAGNOSIS — G4733 Obstructive sleep apnea (adult) (pediatric): Secondary | ICD-10-CM | POA: Diagnosis not present

## 2023-02-01 DIAGNOSIS — D649 Anemia, unspecified: Secondary | ICD-10-CM

## 2023-02-01 DIAGNOSIS — E039 Hypothyroidism, unspecified: Secondary | ICD-10-CM | POA: Diagnosis not present

## 2023-02-01 DIAGNOSIS — D508 Other iron deficiency anemias: Secondary | ICD-10-CM | POA: Diagnosis not present

## 2023-02-01 DIAGNOSIS — E119 Type 2 diabetes mellitus without complications: Secondary | ICD-10-CM | POA: Diagnosis not present

## 2023-02-01 DIAGNOSIS — J45909 Unspecified asthma, uncomplicated: Secondary | ICD-10-CM | POA: Diagnosis not present

## 2023-02-01 DIAGNOSIS — Z86718 Personal history of other venous thrombosis and embolism: Secondary | ICD-10-CM | POA: Diagnosis not present

## 2023-02-01 DIAGNOSIS — Z86711 Personal history of pulmonary embolism: Secondary | ICD-10-CM | POA: Diagnosis not present

## 2023-02-01 DIAGNOSIS — R002 Palpitations: Secondary | ICD-10-CM | POA: Diagnosis not present

## 2023-02-01 DIAGNOSIS — R200 Anesthesia of skin: Secondary | ICD-10-CM | POA: Diagnosis not present

## 2023-02-01 DIAGNOSIS — D509 Iron deficiency anemia, unspecified: Secondary | ICD-10-CM | POA: Insufficient documentation

## 2023-02-01 DIAGNOSIS — E785 Hyperlipidemia, unspecified: Secondary | ICD-10-CM | POA: Diagnosis not present

## 2023-02-01 DIAGNOSIS — R519 Headache, unspecified: Secondary | ICD-10-CM | POA: Diagnosis not present

## 2023-02-01 NOTE — Progress Notes (Unsigned)
[facility] [address], [place] [address]   Clinic Day:  02/01/2023  Referring physician: [name], MD  Patient Care Team: [name], MD as PCP - General (Family Medicine) Jonelle Sidle, MD as PCP - Cardiology (Cardiology) Dohmeier, Porfirio Mylar, MD as Consulting Physician (Neurology) [facility], [facility] Doreatha Massed, MD as Consulting Physician (Hematology) Dani Gobble, NP as Nurse Practitioner (Nurse Practitioner) Clinton Gallant, RN as [facility] Care Management   CHIEF COMPLAINT/PURPOSE OF CONSULT:   Diagnosis: elevated sFLC, normocytic anemia  HISTORY OF PRESENT ILLNESS:   Paul Baker is a 73 y.o. male returns for a follow up for normocytic anemia and elevated serum free light chains He is alone today.He was last seen in clinic on 08/02/22 by me.  Patient is followed closely by Dr. Diona Browner with cardiology for CHF.  He has some dyspnea with exertion and bilateral lower extremity swelling.  He is followed by endocrinology for his diabetes, hyperlipidemia, hypertension and a thyroid problem.  A1c continues to improve and was 7.8 at his last visit.  He is currently on Ozempic.    Reports his energy levels are low 50% appetite is between 50 and 60%.  Has pain in his hip and back rates it a 6 out of 10. Has stable chronic shortness of breath, chest pain, palpitations secondary to CHF.  Has trouble sleeping and tosses and turns.  Reports some numbness and burning in his hands and arms.  Has occasional dizziness and headaches.  Has nausea and trouble swallowing solids.   He has been taking OTC iron tablets daily along with B12 supplements.  Denies any constipation or GI upset.  He has not noticed any bleeding.   PAST MEDICAL HISTORY:   Past Medical History: Past Medical History:  Diagnosis Date   Arthritis    Asthma    Atrial fibrillation [facility])    Diagnosed December 2021   Colon polyp     Coronary atherosclerosis of native coronary artery    a. 2011: cath showing 90% stenosis along small non-dominant RCA (too small for PCI). b. 01/2018: cath showing nonobstructive CAD with 60 to 70% proximal to mid nondominant RCA stenosis, 50% mid LAD and 40 to 50% OM1   DVT (deep venous thrombosis) (HCC) 2005   Right arm   Essential hypertension    Headache    History of transfusion    Hypothyroidism    Mixed hyperlipidemia    Morbid obesity (HCC)    MRSA (methicillin resistant staph aureus) culture positive    08/2012   Narcolepsy    OSA (obstructive sleep apnea)    CPAP   Osteoarthritis    Pneumonia    Psoriasis    Pulmonary embolism (HCC) 2004   RLS (restless legs syndrome)    Rotator cuff disorder    Left   Septic arthritis of knee, left (HCC)    Skin cancer, basal cell    Type 2 diabetes mellitus (HCC)     Surgical History: Past Surgical History:  Procedure Laterality Date   BACK SURGERY     BIOPSY  01/31/2022   Procedure: BIOPSY;  Surgeon: Lynann Bologna, DO;  Location: [facility];  Service: Gastroenterology;;   CATARACT EXTRACTION Bilateral    COLONOSCOPY     COLONOSCOPY WITH PROPOFOL N/A 01/31/2022   Procedure: COLONOSCOPY WITH PROPOFOL;  Surgeon: Lynann Bologna, DO;  Location: [facility];  Service: Gastroenterology;  Laterality: N/A;   CYST  REMOVAL TRUNK     from back   ESOPHAGOGASTRODUODENOSCOPY (EGD) WITH PROPOFOL N/A 01/31/2022   Procedure: ESOPHAGOGASTRODUODENOSCOPY (EGD) WITH PROPOFOL;  Surgeon: Lynann Bologna, DO;  Location: [facility];  Service: Gastroenterology;  Laterality: N/A;   EYE SURGERY Left 2016   laser to left eye   HIP SURGERY     bone removed from both sides of hip   KNEE ARTHROTOMY Right 12/04/2014   Procedure: KNEE ARTHROTOMY PATELLA LIGAMENT RECONSTRUSION AND REPAIR RIGHT KNEE;  Surgeon: Durene Romans, MD;  Location: [facility] OR;  Service: Orthopedics;  Laterality: Right;   KNEE SURGERY     X 25 TIMES   LEFT HEART CATH AND  CORONARY ANGIOGRAPHY N/A 01/17/2018   Procedure: LEFT HEART CATH AND CORONARY ANGIOGRAPHY;  Surgeon: Lyn Records, MD;  Location: [facility] INVASIVE CV LAB;  Service: Cardiovascular;  Laterality: N/A;   LUMBAR DISC SURGERY     Left L3, L4, L5 discecotomy with decompression of L4 root   POLYPECTOMY  01/31/2022   Procedure: POLYPECTOMY;  Surgeon: Lynann Bologna, DO;  Location: [facility];  Service: Gastroenterology;;   TONSILLECTOMY     TOTAL KNEE ARTHROPLASTY  2003   LEFT   TOTAL KNEE ARTHROPLASTY Right 03/23/2014   Procedure: RIGHT TOTAL KNEE ARTHROPLASTY AND REMOVAL RIGHT TIBIAL  DEEP IMPLANT STAPLE;  Surgeon: Durene Romans, MD;  Location: [facility];  Service: Orthopedics;  Laterality: Right;   TOTAL KNEE REVISION  2005   LEFT   WRIST SURGERY      Social History: Social History   Socioeconomic History   Marital status: Married    Spouse name: Engineer, materials   Number of children: 2   Years of education: college   Highest education level: Not on file  Occupational History   Occupation: Disabled    Employer: UNEMPLOYED  Tobacco Use   Smoking status: Former    Current packs/day: 0.00    Average packs/day: 1.5 packs/day for 27.5 years (41.3 ttl pk-yrs)    Types: Cigarettes    Start date: 06/19/1967    Quit date: 01/03/1995    Years since quitting: 28.0    Passive exposure: Never   Smokeless tobacco: Never  Vaping Use   Vaping status: Never Used  Substance and Sexual Activity   Alcohol use: No    Alcohol/week: 0.0 standard drinks of alcohol    Comment: quit drinking in 07/86   Drug use: No   Sexual activity: Yes    Partners: Female  Other Topics Concern   Not on file  Social History Narrative    73 year old, right-handed, caucasian male with a past medical history of obesity, hypertension, hyperlipidemia, diabetes, obstructive sleep apnea, presenting with frequent nighttime awakenings, excessive daytime sleepiness, also transient confusional episodes.RLS and one beosity, OSA on CPAP with  AHI of 3.2 and  setting of 16 cm water , [facility] .   Married since 1977.   Social Drivers of Corporate investment banker Strain: Low Risk  (05/05/2022)   Overall Financial Resource Strain (CARDIA)    Difficulty of Paying Living Expenses: Not hard at all  Food Insecurity: Patient Declined (07/12/2022)   Hunger Vital Sign    Worried About Running Out of Food in the Last Year: Patient declined    Ran Out of Food in the Last Year: Patient declined  Transportation Needs: No Transportation Needs (07/17/2022)   PRAPARE - Administrator, Civil Service (Medical): No    Lack of Transportation (Non-Medical): No  Physical Activity:  Insufficiently Active (05/05/2022)   Exercise Vital Sign    Days of Exercise per Week: 3 days    Minutes of Exercise per Session: 30 min  Stress: No Stress Concern Present (05/05/2022)   [employer] of Occupational Health - Occupational Stress Questionnaire    Feeling of Stress : Not at all  Social Connections: Socially Integrated (05/05/2022)   Social Connection and Isolation Panel [NHANES]    Frequency of Communication with Friends and Family: More than three times a week    Frequency of Social Gatherings with Friends and Family: More than three times a week    Attends Religious Services: More than 4 times per year    Active Member of [employer] or Organizations: Yes    Attends Engineer, structural: More than 4 times per year    Marital Status: Married  Catering manager Violence: Not At Risk (05/05/2022)   Humiliation, Afraid, Rape, and Kick questionnaire    Fear of Current or Ex-Partner: No    Emotionally Abused: No    Physically Abused: No    Sexually Abused: No    Family History: Family History  Problem Relation Age of Onset   Hypertension Father    Heart attack Father    Kidney Stones Father    Seizures Grandchild    Narcolepsy Grandchild    Diabetes Sister     Current Medications:  Current Outpatient Medications:     acetaminophen (TYLENOL) 325 MG tablet, Take 2 tablets (650 mg total) by mouth every 6 (six) hours as needed for mild pain (or Fever >/= 101). (Patient taking differently: Take 1,000 mg by mouth every 6 (six) hours as needed for mild pain (pain score 1-3) (or Fever >/= 101).), Disp: 30 tablet, Rfl: 1   albuterol (PROVENTIL) (2.5 MG/3ML) 0.083% nebulizer solution, Take 3 mLs (2.5 mg total) by nebulization every 6 (six) hours as needed for up to 7 days for wheezing or shortness of breath., Disp: 84 mL, Rfl: 3   albuterol (VENTOLIN HFA) 108 (90 Base) MCG/ACT inhaler, Inhale 2 puffs into the lungs every 6 (six) hours as needed for wheezing or shortness of breath., Disp: 18 g, Rfl: 5   alfuzosin (UROXATRAL) 10 MG 24 hr tablet, Take 10 mg by mouth daily., Disp: , Rfl:    Alpha Lipoic Acid-Biotin ER 600-450 MG-MCG TBCR, Take 600 mg by mouth every morning. (Patient taking differently: Take 600 mg by mouth at bedtime.), Disp: , Rfl:    apixaban (ELIQUIS) 5 MG TABS tablet, Take 1 tablet (5 mg total) by mouth 2 (two) times daily. For stroke prevention, Disp: 180 tablet, Rfl: 1   clobetasol cream (TEMOVATE) 0.05 %, Apply thin amount 2 times daily prn to psoriasis not for face axilla or groin, Disp: 45 g, Rfl: 1   Continuous Glucose Sensor (DEXCOM G7 SENSOR) MISC, Change sensor every 10 days, Disp: 3 each, Rfl: 3   desonide (DESOWEN) 0.05 % cream, Apply once daily prn to facial rash, Disp: 30 g, Rfl: 0   dextromethorphan-guaiFENesin (MUCINEX DM) 30-600 MG 12hr tablet, Take 1 tablet by mouth 2 (two) times daily. (Patient taking differently: Take 1 tablet by mouth as needed for cough.), Disp: 20 tablet, Rfl: 0   FIASP FLEXTOUCH 100 UNIT/ML FlexTouch Pen, Inject 30-36 Units into the skin in the morning, at noon, and at bedtime., Disp: , Rfl:    fluticasone (FLONASE) 50 MCG/ACT nasal spray, Place 1 spray into both nostrils daily. (Patient taking differently: Place 1 spray into both nostrils  daily as needed.), Disp: 16 g,  Rfl: 2   Fluticasone-Umeclidin-Vilant (TRELEGY ELLIPTA) 100-62.5-25 MCG/ACT AEPB, Inhale 1 puff into the lungs daily., Disp: 60 each, Rfl: 0   Glucagon (GVOKE HYPOPEN 2-PACK) 1 MG/0.2ML SOAJ, Inject 1 mg subcutaneously once as needed for low blood sugar. May repeat dose in 15 minutes as needed using a new device., Disp: 0.4 mL, Rfl: 1   insulin degludec (TRESIBA FLEXTOUCH) 200 UNIT/ML FlexTouch Pen, Inject 120 Units into the skin at bedtime., Disp: , Rfl:    Insulin Pen Needle (B-D ULTRAFINE III SHORT PEN) 31G X 8 MM MISC, USE 1 PEN NEEDLE THREE TIMES DAILY, Disp: 100 each, Rfl: 2   isosorbide mononitrate (IMDUR) 30 MG 24 hr tablet, Take 1 tablet (30 mg total) by mouth daily., Disp: 90 tablet, Rfl: 1   levothyroxine (SYNTHROID) 137 MCG tablet, Take 1 tablet (137 mcg total) by mouth daily before breakfast., Disp: 90 tablet, Rfl: 3   loratadine (CLARITIN) 10 MG tablet, Take 10 mg by mouth daily as needed for allergies. , Disp: , Rfl:    losartan (COZAAR) 50 MG tablet, Take 50 mg by mouth daily., Disp: , Rfl:    metoprolol succinate (TOPROL XL) 25 MG 24 hr tablet, Take 1 tablet (25 mg total) by mouth 2 (two) times daily., Disp: 270 tablet, Rfl: 1   metoprolol tartrate (LOPRESSOR) 25 MG tablet, Take 25 mg by mouth as needed (palpitations)., Disp: , Rfl:    modafinil (PROVIGIL) 200 MG tablet, Take 1 tablet (200 mg total) by mouth daily., Disp: 30 tablet, Rfl: 5   nitroGLYCERIN (NITROSTAT) 0.4 MG SL tablet, DISSOLVE 1 TABLET UNDER TONGUE EVERY 5 MINUTES UP TO 15 MIN FOR CHESTPAIN. IF NO RELIEF CALL 911., Disp: 25 tablet, Rfl: 3   ONE TOUCH ULTRA TEST test strip, TEST BLOOD SUGAR UP TO 4 TIMES DAILY., Disp: 150 each, Rfl: 5   ONETOUCH DELICA LANCETS 33G MISC, USE AS DIRECTED TO TEST BLOOD SUGAR 4 TIMES DAILY., Disp: 150 each, Rfl: 5   pravastatin (PRAVACHOL) 40 MG tablet, TAKE 1 TABLET BY MOUTH ONCE DAILY IN THE EVENING, Disp: 90 tablet, Rfl: 0   rOPINIRole (REQUIP XL) 4 MG 24 hr tablet, TAKE 1 TABLET BY  MOUTH AT BEDTIME, Disp: 90 tablet, Rfl: 0   rOPINIRole (REQUIP) 4 MG tablet, Take 1/2 (one-half) tablet by mouth twice daily, Disp: 90 tablet, Rfl: 0   Semaglutide, 1 MG/DOSE, 4 MG/3ML SOPN, Inject 1 mg into the skin once a week., Disp: 9 mL, Rfl: 1   torsemide (DEMADEX) 20 MG tablet, Take 3 tablets (60 mg total) by mouth every morning. & 40 mg in the evening, Disp: 450 tablet, Rfl: 1   Allergies: Allergies  Allergen Reactions   Tape Rash    Pulls off skin   Farxiga [Dapagliflozin]     Yeast infections, felt sick with it   Cardizem [Diltiazem Hcl]     Edema    Cardura [Doxazosin Mesylate]     Headaches / cramps   Crestor [Rosuvastatin] Other (See Comments)    Leg cramps   Lipitor [Atorvastatin] Other (See Comments)    Leg cramps   Paxil [Paroxetine Hcl]     Unknown reaction    Codeine Rash and Other (See Comments)    Headache    Gabapentin Rash    REVIEW OF SYSTEMS:   Review of Systems  HENT:   Positive for trouble swallowing.   Respiratory:  Positive for shortness of breath.   Cardiovascular:  Positive  for chest pain, leg swelling and palpitations.  Gastrointestinal:  Positive for nausea.  Musculoskeletal:  Positive for arthralgias.  Neurological:  Positive for dizziness, headaches and numbness.  Psychiatric/Behavioral:  Positive for sleep disturbance.      VITALS:   There were no vitals taken for this visit.  Wt Readings from Last 3 Encounters:  12/22/22 226 lb 6.4 oz (102.7 kg)  12/08/22 231 lb 3.2 oz (104.9 kg)  12/06/22 227 lb 6.4 oz (103.1 kg)    There is no height or weight on file to calculate BMI.   PHYSICAL EXAM:   Physical Exam Constitutional:      General: He is not in acute distress.    Appearance: Normal appearance. He is obese.  Cardiovascular:     Rate and Rhythm: Normal rate and regular rhythm.  Pulmonary:     Effort: Pulmonary effort is normal.     Breath sounds: Normal breath sounds.  Abdominal:     General: Bowel sounds are normal.      Palpations: Abdomen is soft.  Musculoskeletal:        General: No swelling. Normal range of motion.  Neurological:     Mental Status: He is alert and oriented to person, place, and time. Mental status is at baseline.     LABS:      Latest Ref Rng & Units 01/25/2023    9:39 AM 10/05/2022    4:32 PM 07/26/2022   10:35 AM  CBC  WBC 4.0 - 10.5 K/uL 8.9  10.0  9.8   Hemoglobin 13.0 - 17.0 g/dL 16.1  09.6  04.5   Hematocrit 39.0 - 52.0 % 34.4  35.8  32.7   Platelets 150 - 400 K/uL 229  308  231       Latest Ref Rng & Units 01/25/2023    9:39 AM 01/01/2023   11:59 AM 12/04/2022    9:31 AM  CMP  Glucose 70 - 99 mg/dL 409  811  914   BUN 8 - 23 mg/dL 45  35  39   Creatinine 0.61 - 1.24 mg/dL 7.82  9.56  2.13   Sodium 135 - 145 mmol/L 138  141  139   Potassium 3.5 - 5.1 mmol/L 4.7  4.7  4.6   Chloride 98 - 111 mmol/L 103  103  100   CO2 22 - 32 mmol/L 25  21  23    Calcium 8.9 - 10.3 mg/dL 9.0  9.3  9.6   Total Protein 6.5 - 8.1 g/dL 7.4   7.0   Total Bilirubin 0.0 - 1.2 mg/dL 0.4   0.2   Alkaline Phos 38 - 126 U/L 58   88   AST 15 - 41 U/L 21   11   ALT 0 - 44 U/L 19   13      No results found for: "CEA1", "CEA" / No results found for: "CEA1", "CEA" Lab Results  Component Value Date   PSA1 2.7 09/11/2018   No results found for: "YQM578" No results found for: "CAN125"  Lab Results  Component Value Date   ALBUMINELP 3.6 02/28/2022   MSPIKE Not Observed 02/28/2022   Lab Results  Component Value Date   TIBC 318 01/25/2023   TIBC 317 07/26/2022   TIBC 355 04/21/2022   FERRITIN 112 01/25/2023   FERRITIN 104 07/26/2022   FERRITIN 61 04/21/2022   IRONPCTSAT 17 (L) 01/25/2023   IRONPCTSAT 25 07/26/2022   IRONPCTSAT 15 (L) 04/21/2022   Lab  Results  Component Value Date   LDH 181 04/21/2022     STUDIES:   No results found.   ASSESSMENT & PLAN:   Assessment:  1.  Elevated free kappa light chains: - Workup for anemia with SPEP-no M spike, immunofixation-normal,  kappa light chains 35.7, lambda light chains 25.3, ratio 1.41. - He has diabetes for 25 years and has tingling in the hands and feet.  2.  Social/family history: - Lives at home with his wife and is independent of ADLs and IADLs.  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  No exposure to chemicals.  Quit smoking in 1997, smoked 1 pack/day for 10 years. - Sister had kidney cancer, mother died of liver cancer.  Father had basal cell skin cancer.  Plan:  1.  Elevated serum free light chains: - Kappa light chains most likely elevated from mild CKD. - Hematology workup from 04/21/2022 showed elevation of light chain with normal ratio.  24-hour UPEP did not reveal monoclonal protein. -Elevated serum free light chain is from inflammation versus kidney dysfunction. -No additional workup is needed at this time.  2.  Normocytic anemia: -Labs from 01/25/2023 show hemoglobin of 10.8 (11.7) with MCV 92.0.  The rest of the CBC is unremarkable.  -Iron levels show an iron saturation 17% (25%) with a ferritin of 112 -Previously have ruled out nutritional deficiencies and hemolysis.  Stool occult negative. -EGD/colonoscopy on 01/31/2022 did not reveal any evidence of bleeding. -He was started on ferrous sulfate 325 mg daily and vitamin B12 supplements 500 mcg daily. -Discussed that iron saturations are slowly declining and would recommend initiating IV iron to see if that improved his hemoglobin.  If no improvement, we discussed initiating EPO treatment.  3. Heart Failure: -He is followed closely by cardiology. -She continues to have dyspnea with exertion and some lower extremity swelling. -Demadex was increased to 60 mg in the morning and 40 mg in the afternoon due to leg swelling.  PLAN SUMMARY: >> Recommend 2 doses of IV Venofer 300 mg. >> Discussed returning to clinic in 4 months with labs a few days before and see provider.    I spent 25 minutes  dedicated to the care of this patient (face-to-face and non-face-to-face) on the date of the encounter to include what is described in the assessment and plan.  Durenda Hurt, NP 02/01/2023 10:50 AM

## 2023-02-05 ENCOUNTER — Inpatient Hospital Stay: Payer: Medicare HMO | Attending: Hematology

## 2023-02-05 VITALS — BP 147/74 | HR 85 | Temp 97.3°F | Resp 18

## 2023-02-05 DIAGNOSIS — Z79899 Other long term (current) drug therapy: Secondary | ICD-10-CM | POA: Diagnosis not present

## 2023-02-05 DIAGNOSIS — Z7985 Long-term (current) use of injectable non-insulin antidiabetic drugs: Secondary | ICD-10-CM | POA: Insufficient documentation

## 2023-02-05 DIAGNOSIS — R519 Headache, unspecified: Secondary | ICD-10-CM | POA: Diagnosis not present

## 2023-02-05 DIAGNOSIS — M7989 Other specified soft tissue disorders: Secondary | ICD-10-CM | POA: Insufficient documentation

## 2023-02-05 DIAGNOSIS — R079 Chest pain, unspecified: Secondary | ICD-10-CM | POA: Insufficient documentation

## 2023-02-05 DIAGNOSIS — R0602 Shortness of breath: Secondary | ICD-10-CM | POA: Insufficient documentation

## 2023-02-05 DIAGNOSIS — D508 Other iron deficiency anemias: Secondary | ICD-10-CM

## 2023-02-05 DIAGNOSIS — G479 Sleep disorder, unspecified: Secondary | ICD-10-CM | POA: Insufficient documentation

## 2023-02-05 DIAGNOSIS — E119 Type 2 diabetes mellitus without complications: Secondary | ICD-10-CM | POA: Diagnosis not present

## 2023-02-05 DIAGNOSIS — D509 Iron deficiency anemia, unspecified: Secondary | ICD-10-CM | POA: Insufficient documentation

## 2023-02-05 DIAGNOSIS — M2550 Pain in unspecified joint: Secondary | ICD-10-CM | POA: Diagnosis not present

## 2023-02-05 DIAGNOSIS — R200 Anesthesia of skin: Secondary | ICD-10-CM | POA: Insufficient documentation

## 2023-02-05 DIAGNOSIS — R002 Palpitations: Secondary | ICD-10-CM | POA: Insufficient documentation

## 2023-02-05 DIAGNOSIS — E785 Hyperlipidemia, unspecified: Secondary | ICD-10-CM | POA: Diagnosis not present

## 2023-02-05 DIAGNOSIS — E669 Obesity, unspecified: Secondary | ICD-10-CM | POA: Insufficient documentation

## 2023-02-05 DIAGNOSIS — R42 Dizziness and giddiness: Secondary | ICD-10-CM | POA: Insufficient documentation

## 2023-02-05 DIAGNOSIS — R110 Nausea: Secondary | ICD-10-CM | POA: Diagnosis not present

## 2023-02-05 MED ORDER — ACETAMINOPHEN 325 MG PO TABS
650.0000 mg | ORAL_TABLET | Freq: Once | ORAL | Status: AC
  Administered 2023-02-05: 650 mg via ORAL
  Filled 2023-02-05: qty 2

## 2023-02-05 MED ORDER — SODIUM CHLORIDE 0.9 % IV SOLN
INTRAVENOUS | Status: DC
Start: 2023-02-05 — End: 2023-02-05

## 2023-02-05 MED ORDER — CETIRIZINE HCL 10 MG/ML IV SOLN
10.0000 mg | Freq: Once | INTRAVENOUS | Status: AC
  Administered 2023-02-05: 10 mg via INTRAVENOUS
  Filled 2023-02-05: qty 1

## 2023-02-05 MED ORDER — IRON SUCROSE 20 MG/ML IV SOLN
300.0000 mg | Freq: Once | INTRAVENOUS | Status: AC
  Administered 2023-02-05: 300 mg via INTRAVENOUS
  Filled 2023-02-05: qty 300

## 2023-02-05 NOTE — Progress Notes (Signed)
Patient presents today for 1st Venofer 300 mg iron infusion. Vital signs stable. Patient has no complaints of any changes since his last visit. MAR reviewed and updated. Pre-medications added to supportive therapy by Beatriz Stallion RPH.   Venofer given today per MD orders. Tolerated infusion without adverse affects. Vital signs stable. No complaints at this time. Discharged from clinic ambulatory in stable condition. Alert and oriented x 3. F/U with [facility] as scheduled.

## 2023-02-05 NOTE — Patient Instructions (Signed)
[facility] [place] - A DEPT OF [facility]  Discharge Instructions: Thank you for choosing [facility] to provide your oncology and hematology care.  If you have a lab appointment with the Cancer Center - please note that after April 8th, 2024, all labs will be drawn in the cancer center.  You do not have to check in or register with the main entrance as you have in the past but will complete your check-in in the cancer center.  Wear comfortable clothing and clothing appropriate for easy access to any Portacath or PICC line.   We strive to give you quality time with your provider. You may need to reschedule your appointment if you arrive late (15 or more minutes).  Arriving late affects you and other patients whose appointments are after yours.  Also, if you miss three or more appointments without notifying the office, you may be dismissed from the clinic at the provider's discretion.      For prescription refill requests, have your pharmacy contact our office and allow 72 hours for refills to be completed.    Today you received the following chemotherapy and/or immunotherapy agents Venofer. Iron Sucrose Injection What is this medication? IRON SUCROSE (EYE ern SOO krose) treats low levels of iron (iron deficiency anemia) in people with kidney disease. Iron is a mineral that plays an important role in making red blood cells, which carry oxygen from your lungs to the rest of your body. This medicine may be used for other purposes; ask your health care provider or pharmacist if you have questions. COMMON BRAND NAME(S): Venofer What should I tell my care team before I take this medication? They need to know if you have any of these conditions: Anemia not caused by low iron levels Heart disease High levels of iron in the blood Kidney disease Liver disease An unusual or allergic reaction to iron, other medications, foods, dyes, or preservatives Pregnant or  trying to get pregnant Breastfeeding How should I use this medication? This medication is infused into a vein. It is given by your care team in a hospital or clinic setting. Talk to your care team about the use of this medication in children. While it may be prescribed for children as young as 2 years for selected conditions, precautions do apply. Overdosage: If you think you have taken too much of this medicine contact a poison control center or emergency room at once. NOTE: This medicine is only for you. Do not share this medicine with others. What if I miss a dose? Keep appointments for follow-up doses. It is important not to miss your dose. Call your care team if you are unable to keep an appointment. What may interact with this medication? Do not take this medication with any of the following: Deferoxamine Dimercaprol Other iron products This medication may also interact with the following: Chloramphenicol Deferasirox This list may not describe all possible interactions. Give your health care provider a list of all the medicines, herbs, non-prescription drugs, or dietary supplements you use. Also tell them if you smoke, drink alcohol, or use illegal drugs. Some items may interact with your medicine. What should I watch for while using this medication? Visit your care team for regular checks on your progress. Tell your care team if your symptoms do not start to get better or if they get worse. You may need blood work done while you are taking this medication. You may need to eat  more foods that contain iron. Talk to your care team. Foods that contain iron include whole grains or cereals, dried fruits, beans, peas, leafy green vegetables, and organ meats (liver, kidney). What side effects may I notice from receiving this medication? Side effects that you should report to your care team as soon as possible: Allergic reactions--skin rash, itching, hives, swelling of the face, lips, tongue,  or throat Low blood pressure--dizziness, feeling faint or lightheaded, blurry vision Shortness of breath Side effects that usually do not require medical attention (report to your care team if they continue or are bothersome): Flushing Headache Joint pain Muscle pain Nausea Pain, redness, or irritation at injection site This list may not describe all possible side effects. Call your doctor for medical advice about side effects. You may report side effects to FDA at 1-800-FDA-1088. Where should I keep my medication? This medication is given in a hospital or clinic. It will not be stored at home. NOTE: This sheet is a summary. It may not cover all possible information. If you have questions about this medicine, talk to your doctor, pharmacist, or health care provider.  2024 Elsevier/Gold Standard (2022-08-09 00:00:00)      To help prevent nausea and vomiting after your treatment, we encourage you to take your nausea medication as directed.  BELOW ARE SYMPTOMS THAT SHOULD BE REPORTED IMMEDIATELY: *FEVER GREATER THAN 100.4 F (38 C) OR HIGHER *CHILLS OR SWEATING *NAUSEA AND VOMITING THAT IS NOT CONTROLLED WITH YOUR NAUSEA MEDICATION *UNUSUAL SHORTNESS OF BREATH *UNUSUAL BRUISING OR BLEEDING *URINARY PROBLEMS (pain or burning when urinating, or frequent urination) *BOWEL PROBLEMS (unusual diarrhea, constipation, pain near the anus) TENDERNESS IN MOUTH AND THROAT WITH OR WITHOUT PRESENCE OF ULCERS (sore throat, sores in mouth, or a toothache) UNUSUAL RASH, SWELLING OR PAIN  UNUSUAL VAGINAL DISCHARGE OR ITCHING   Items with * indicate a potential emergency and should be followed up as soon as possible or go to the Emergency Department if any problems should occur.  Please show the CHEMOTHERAPY ALERT CARD or IMMUNOTHERAPY ALERT CARD at check-in to the Emergency Department and triage nurse.  Should you have questions after your visit or need to cancel or reschedule your appointment, please  contact [facility] [place] - A DEPT OF Eligha Bridegroom [facility] [phone]  and follow the prompts.  Office hours are 8:00 a.m. to 4:30 p.m. Monday - Friday. Please note that voicemails left after 4:00 p.m. may not be returned until the following business day.  We are closed weekends and major holidays. You have access to a nurse at all times for urgent questions. Please call the main number to the clinic [phone] and follow the prompts.  For any non-urgent questions, you may also contact your provider using MyChart. We now offer e-Visits for anyone 70 and older to request care online for non-urgent symptoms. For details visit [email or web address].   Also download the MyChart app! Go to the app store, search "MyChart", open the app, select [place], and log in with your MyChart username and password.

## 2023-02-12 ENCOUNTER — Inpatient Hospital Stay: Payer: Medicare HMO

## 2023-02-12 VITALS — BP 126/64 | HR 68 | Temp 96.9°F | Resp 18

## 2023-02-12 DIAGNOSIS — E669 Obesity, unspecified: Secondary | ICD-10-CM | POA: Diagnosis not present

## 2023-02-12 DIAGNOSIS — E119 Type 2 diabetes mellitus without complications: Secondary | ICD-10-CM | POA: Diagnosis not present

## 2023-02-12 DIAGNOSIS — R0602 Shortness of breath: Secondary | ICD-10-CM | POA: Diagnosis not present

## 2023-02-12 DIAGNOSIS — R519 Headache, unspecified: Secondary | ICD-10-CM | POA: Diagnosis not present

## 2023-02-12 DIAGNOSIS — G479 Sleep disorder, unspecified: Secondary | ICD-10-CM | POA: Diagnosis not present

## 2023-02-12 DIAGNOSIS — D509 Iron deficiency anemia, unspecified: Secondary | ICD-10-CM | POA: Diagnosis not present

## 2023-02-12 DIAGNOSIS — R200 Anesthesia of skin: Secondary | ICD-10-CM | POA: Diagnosis not present

## 2023-02-12 DIAGNOSIS — Z7985 Long-term (current) use of injectable non-insulin antidiabetic drugs: Secondary | ICD-10-CM | POA: Diagnosis not present

## 2023-02-12 DIAGNOSIS — D508 Other iron deficiency anemias: Secondary | ICD-10-CM

## 2023-02-12 DIAGNOSIS — M2550 Pain in unspecified joint: Secondary | ICD-10-CM | POA: Diagnosis not present

## 2023-02-12 DIAGNOSIS — R110 Nausea: Secondary | ICD-10-CM | POA: Diagnosis not present

## 2023-02-12 DIAGNOSIS — Z79899 Other long term (current) drug therapy: Secondary | ICD-10-CM | POA: Diagnosis not present

## 2023-02-12 DIAGNOSIS — R079 Chest pain, unspecified: Secondary | ICD-10-CM | POA: Diagnosis not present

## 2023-02-12 DIAGNOSIS — E785 Hyperlipidemia, unspecified: Secondary | ICD-10-CM | POA: Diagnosis not present

## 2023-02-12 DIAGNOSIS — R002 Palpitations: Secondary | ICD-10-CM | POA: Diagnosis not present

## 2023-02-12 DIAGNOSIS — M7989 Other specified soft tissue disorders: Secondary | ICD-10-CM | POA: Diagnosis not present

## 2023-02-12 DIAGNOSIS — R42 Dizziness and giddiness: Secondary | ICD-10-CM | POA: Diagnosis not present

## 2023-02-12 MED ORDER — CETIRIZINE HCL 10 MG/ML IV SOLN
10.0000 mg | Freq: Once | INTRAVENOUS | Status: AC
Start: 2023-02-12 — End: 2023-02-12
  Administered 2023-02-12: 10 mg via INTRAVENOUS
  Filled 2023-02-12: qty 1

## 2023-02-12 MED ORDER — SODIUM CHLORIDE 0.9 % IV SOLN
300.0000 mg | Freq: Once | INTRAVENOUS | Status: AC
  Administered 2023-02-12: 300 mg via INTRAVENOUS
  Filled 2023-02-12: qty 300

## 2023-02-12 MED ORDER — SODIUM CHLORIDE 0.9 % IV SOLN: INTRAVENOUS | Status: DC

## 2023-02-12 MED ORDER — ACETAMINOPHEN 325 MG PO TABS
650.0000 mg | ORAL_TABLET | Freq: Once | ORAL | Status: AC
Start: 2023-02-12 — End: 2023-02-12
  Administered 2023-02-12: 650 mg via ORAL
  Filled 2023-02-12: qty 2

## 2023-02-12 NOTE — Progress Notes (Signed)
Patient presents today for iron infusion.  Patient is in satisfactory condition with no new complaints voiced.  Vital signs are stable.  We will proceed with infusion per provider orders.    Peripheral IV started with good blood return pre and post infusion.  Discharged from clinic ambulatory in stable condition. Alert and oriented x 3. F/U with [facility] as scheduled.

## 2023-02-12 NOTE — Patient Instructions (Signed)
 [facility] [place] - A DEPT OF [facility]  Discharge Instructions: Thank you for choosing [facility] to provide your oncology and hematology care.  If you have a lab appointment with the Cancer Center - please note that after April 8th, 2024, all labs will be drawn in the cancer center.  You do not have to check in or register with the main entrance as you have in the past but will complete your check-in in the cancer center.  Wear comfortable clothing and clothing appropriate for easy access to any Portacath or PICC line.   We strive to give you quality time with your provider. You may need to reschedule your appointment if you arrive late (15 or more minutes).  Arriving late affects you and other patients whose appointments are after yours.  Also, if you miss three or more appointments without notifying the office, you may be dismissed from the clinic at the provider's discretion.      For prescription refill requests, have your pharmacy contact our office and allow 72 hours for refills to be completed.    Today you received Venofer IV iron infusion.     BELOW ARE SYMPTOMS THAT SHOULD BE REPORTED IMMEDIATELY: *FEVER GREATER THAN 100.4 F (38 C) OR HIGHER *CHILLS OR SWEATING *NAUSEA AND VOMITING THAT IS NOT CONTROLLED WITH YOUR NAUSEA MEDICATION *UNUSUAL SHORTNESS OF BREATH *UNUSUAL BRUISING OR BLEEDING *URINARY PROBLEMS (pain or burning when urinating, or frequent urination) *BOWEL PROBLEMS (unusual diarrhea, constipation, pain near the anus) TENDERNESS IN MOUTH AND THROAT WITH OR WITHOUT PRESENCE OF ULCERS (sore throat, sores in mouth, or a toothache) UNUSUAL RASH, SWELLING OR PAIN  UNUSUAL VAGINAL DISCHARGE OR ITCHING   Items with * indicate a potential emergency and should be followed up as soon as possible or go to the Emergency Department if any problems should occur.  Please show the CHEMOTHERAPY ALERT CARD or IMMUNOTHERAPY ALERT CARD at  check-in to the Emergency Department and triage nurse.  Should you have questions after your visit or need to cancel or reschedule your appointment, please contact [facility]  - A DEPT OF [facility] [phone]  and follow the prompts.  Office hours are 8:00 a.m. to 4:30 p.m. Monday - Friday. Please note that voicemails left after 4:00 p.m. may not be returned until the following business day.  We are closed weekends and major holidays. You have access to a nurse at all times for urgent questions. Please call the main number to the clinic [phone] and follow the prompts.  For any non-urgent questions, you may also contact your provider using MyChart. We now offer e-Visits for anyone 13 and older to request care online for non-urgent symptoms. For details visit [email or web address].   Also download the MyChart app! Go to the app store, search "MyChart", open the app, select [place], and log in with your MyChart username and password.

## 2023-02-13 ENCOUNTER — Telehealth: Payer: Self-pay | Admitting: Cardiology

## 2023-02-13 MED ORDER — APIXABAN 5 MG PO TABS: 5.0000 mg | ORAL_TABLET | Freq: Two times a day (BID) | ORAL | 5 refills | Status: DC

## 2023-02-13 NOTE — Telephone Encounter (Signed)
*  STAT* If patient is at the pharmacy, call can be transferred to refill team.   1. Which medications need to be refilled? (please list name of each medication and dose if known) apixaban (ELIQUIS) 5 MG TABS tablet   2. Which pharmacy/location (including street and city if local pharmacy) is medication to be sent to? [id number] - [place], [place] - [address]   3. Do they need a 30 day or 90 day supply? 30

## 2023-02-13 NOTE — Telephone Encounter (Signed)
Prescription refill request for Eliquis received. Indication: PAF Last office visit: 12/22/22  Ival Bible MD Scr: 1.37 on 01/25/23  Epic Age: 73 Weight: 102.7kg  Based on above findings Eliquis 5mg  twice daily is the appropriate dose.  Refill approved.

## 2023-02-20 ENCOUNTER — Other Ambulatory Visit: Payer: Self-pay | Admitting: *Deleted

## 2023-02-23 DIAGNOSIS — E1165 Type 2 diabetes mellitus with hyperglycemia: Secondary | ICD-10-CM | POA: Diagnosis not present

## 2023-02-26 DIAGNOSIS — L738 Other specified follicular disorders: Secondary | ICD-10-CM | POA: Diagnosis not present

## 2023-03-01 ENCOUNTER — Other Ambulatory Visit: Payer: Self-pay | Admitting: Neurology

## 2023-03-01 NOTE — Telephone Encounter (Signed)
 Last seen on 05/04/22 Follow up scheduled on 08/06/23

## 2023-03-08 ENCOUNTER — Other Ambulatory Visit (HOSPITAL_COMMUNITY): Payer: Self-pay

## 2023-03-08 ENCOUNTER — Telehealth: Payer: Self-pay | Admitting: Pharmacy Technician

## 2023-03-08 NOTE — Telephone Encounter (Signed)
 Pharmacy Patient Advocate Encounter  Received notification from [facility] that Prior Authorization for Modafinil 200MG  tablets  has been APPROVED from 03/08/2023 to 01/02/2024   PA #/Case ID/Reference #: [id number]

## 2023-03-08 NOTE — Telephone Encounter (Signed)
 Pharmacy Patient Advocate Encounter   Received notification from CoverMyMeds that prior authorization for Modafinil 200MG  tablets is required/requested.   Insurance verification completed.   The patient is insured through [facility] .   Per test claim: PA required; PA submitted to above mentioned insurance via CoverMyMeds Key/confirmation #/EOC [id number] Status is pending

## 2023-03-10 DIAGNOSIS — E1159 Type 2 diabetes mellitus with other circulatory complications: Secondary | ICD-10-CM | POA: Diagnosis not present

## 2023-03-19 DIAGNOSIS — G4733 Obstructive sleep apnea (adult) (pediatric): Secondary | ICD-10-CM | POA: Diagnosis not present

## 2023-03-20 ENCOUNTER — Telehealth: Payer: Self-pay | Admitting: Family Medicine

## 2023-03-20 NOTE — Telephone Encounter (Signed)
 LVM and sent mychart msg informing pt of need to reschedule 08/06/23 appt - NP out

## 2023-03-26 ENCOUNTER — Telehealth: Payer: Self-pay | Admitting: Adult Health

## 2023-03-26 MED ORDER — TRELEGY ELLIPTA 100-62.5-25 MCG/ACT IN AEPB: 1.0000 | INHALATION_SPRAY | Freq: Every day | RESPIRATORY_TRACT | 5 refills | Status: DC

## 2023-03-26 NOTE — Telephone Encounter (Signed)
 Called and spoke with patient, he stated he needs a refill of his inhaler.  I asked him if it was the [facility] or the Trelegy.  As of his last office visit with Dr. Craige Cotta 6 months ago, he was on Trelegy.  He said the Trelegy got expensive and he went back to [place], but they are both about the same now.  He said the Virgel Bouquet must be an old one he had.  He said he would stick with the Trelegy since it has the 3 medications in it.  I verified his pharmacy and the refill sent.  He has an upcoming appointment with Tammy on 5/8.  Nothing further needed.

## 2023-03-26 NOTE — Telephone Encounter (Signed)
 Patient states needs refill for [facility]. Pharmacy is [facility]  [place]. Patient scheduled 05/10/2023 with Rubye Oaks NP. Patient phone number is [phone].

## 2023-04-03 ENCOUNTER — Telehealth: Payer: Self-pay | Admitting: Nurse Practitioner

## 2023-04-03 NOTE — Telephone Encounter (Signed)
 Pt picked up pt asssitance

## 2023-04-03 NOTE — Telephone Encounter (Signed)
 Called and let pt know pt asssistance is here

## 2023-04-09 ENCOUNTER — Ambulatory Visit: Payer: Medicare HMO | Admitting: Nurse Practitioner

## 2023-04-09 ENCOUNTER — Ambulatory Visit: Payer: Self-pay | Admitting: *Deleted

## 2023-04-09 ENCOUNTER — Encounter: Payer: Self-pay | Admitting: *Deleted

## 2023-04-09 ENCOUNTER — Encounter: Payer: Self-pay | Admitting: Nurse Practitioner

## 2023-04-09 VITALS — BP 128/64 | HR 72 | Ht 65.0 in | Wt 225.2 lb

## 2023-04-09 DIAGNOSIS — E038 Other specified hypothyroidism: Secondary | ICD-10-CM

## 2023-04-09 DIAGNOSIS — I10 Essential (primary) hypertension: Secondary | ICD-10-CM

## 2023-04-09 DIAGNOSIS — Z794 Long term (current) use of insulin: Secondary | ICD-10-CM | POA: Diagnosis not present

## 2023-04-09 DIAGNOSIS — E782 Mixed hyperlipidemia: Secondary | ICD-10-CM

## 2023-04-09 DIAGNOSIS — Z7985 Long-term (current) use of injectable non-insulin antidiabetic drugs: Secondary | ICD-10-CM

## 2023-04-09 DIAGNOSIS — E1159 Type 2 diabetes mellitus with other circulatory complications: Secondary | ICD-10-CM

## 2023-04-09 LAB — POCT GLYCOSYLATED HEMOGLOBIN (HGB A1C): Hemoglobin A1C: 8.5 % — AB (ref 4.0–5.6)

## 2023-04-09 NOTE — Patient Instructions (Signed)
 Visit Information  Thank you for taking time to visit with me today. Please don't hesitate to contact me if I can be of assistance to you.  Mr Rudman the RN care manager at Dr [name] office has changed to [employer]	[place].  If you have further needs and would like to reschedule with her please dial [phone]   Following are the goals we discussed today:   Goals Addressed             This Visit's Progress    COMPLETED: Home management of diabetes & congestive heart failure- be more active or compression hose RN CM care coordination services       Since 2024 his hypoglycemic episodes have decrease after a change in insulin and drinking a cup of milk before bed.   Constipation symptoms have been resolved Managing diabetes and congestive heart failure better    Interventions Today    Flowsheet Row Most Recent Value  Chronic Disease   Chronic disease during today's visit Diabetes, Congestive Heart Failure (CHF), Other  [insomnia, chronic pain, RLS, CPAP/sleep apnea retesting]  General Interventions   General Interventions Discussed/Reviewed General Interventions Reviewed, Labs, Durable Medical Equipment (DME), Doctor Visits  Labs --  Theodosia Paling of december 2024 ultrasound of shoulder]  Doctor Visits Discussed/Reviewed Doctor Visits Reviewed, PCP, Specialist  [pcp, neurology, pain managment]  Durable Medical Equipment (DME) Other  [CPAP use and cleaning assessed]  PCP/Specialist Visits Compliance with follow-up visit  Education Interventions   Education Provided Provided Web-based Education, Provided Education  Provided Verbal Education On Labs, Mental Health/Coping with Illness, Medication, When to see the doctor, [facility], Other  [CPAP, insomina, Restless leg syndrome, drug interaction checker, mouth breathing, irriation/pressue on nerve, protein snack to prevent hypoglycemia, lipoma, shoulder exercises]  Labs Reviewed --  Rinaldo Cloud of right shoulder]  Mental  Health Interventions   Mental Health Discussed/Reviewed Mental Health Reviewed, Coping Strategies  Nutrition Interventions   Nutrition Discussed/Reviewed Nutrition Reviewed, Increasing proteins  Pharmacy Interventions   Pharmacy Dicussed/Reviewed Pharmacy Topics Reviewed, Medications and their functions, Affording Medications                  Our next appointment is no further scheduled appointments.  Patient is welcome to outreach to new RN care manager's assistant at [phone]   on any day of his choice at a time convenient for him to schedule another appointment   Please call the care guide team at [phone] if you need to cancel or reschedule your appointment.   If you are experiencing a Mental Health or Behavioral Health Crisis or need someone to talk to, please call the Suicide and Crisis Lifeline: 988 call the [place] National Suicide Prevention Lifeline: 505-500-6076 or TTY: 408 473 3004 TTY 912-376-0210) to talk to a trained counselor call 1-800-273-TALK (toll free, 24 hour hotline) call the [facility] [place]: 249-369-0200 call 911   Unsuccessful outreach for 2 month follow up   The patient has been provided with contact information for the care management team and has been advised to call with any health related questions or concerns.   Adriyanna Christians L. Noelle Penner, RN, BSN, CCM [place]  [facility], [facility] RN Care Manager Direct Dial: [phone]  Fax: [phone]

## 2023-04-09 NOTE — Patient Outreach (Signed)
 Complex Care Management   Visit Note  04/09/2023  Name:  Paul Baker MRN: [id number] DOB: [birth year]   Unable to reach patient at scheduled follow up outreach This outreach was to follow up with plans to close & warm transfer patient to his pcp's new RN care manager if he had further needs  Closing From:  Complex Care Management Patient has met all care management goals. Care Management case will be closed. Patient has been provided contact information should new needs arise.  Left patient a message to inform him of the reassignment of Dr Fletcher Anon RN Care manager to Danise Edge. RN Futures trader. Left this RN care manager's number for any questions + Paul Baker's number for future needs  Jamara Vary L. Noelle Penner, RN, BSN, CCM [place]  [facility], [facility] RN Care Manager Direct Dial: [phone]  Fax: [phone]

## 2023-04-09 NOTE — Progress Notes (Signed)
 04/09/2023   Endocrinology follow-up note   Subjective:    Patient ID: Paul Baker, male    DOB: Feb 01, 1950, PCP [name], MD   Past Medical History:  Diagnosis Date   Arthritis    Asthma    Atrial fibrillation [facility])    Diagnosed December 2021   Colon polyp    Coronary atherosclerosis of native coronary artery    a. 2011: cath showing 90% stenosis along small non-dominant RCA (too small for PCI). b. 01/2018: cath showing nonobstructive CAD with 60 to 70% proximal to mid nondominant RCA stenosis, 50% mid LAD and 40 to 50% OM1   DVT (deep venous thrombosis) (HCC) 2005   Right arm   Essential hypertension    Headache    History of transfusion    Hypothyroidism    Mixed hyperlipidemia    Morbid obesity (HCC)    MRSA (methicillin resistant staph aureus) culture positive    08/2012   Narcolepsy    OSA (obstructive sleep apnea)    CPAP   Osteoarthritis    Pneumonia    Psoriasis    Pulmonary embolism (HCC) 2004   RLS (restless legs syndrome)    Rotator cuff disorder    Left   Septic arthritis of knee, left (HCC)    Skin cancer, basal cell    Type 2 diabetes mellitus (HCC)    Past Surgical History:  Procedure Laterality Date   BACK SURGERY     BIOPSY  01/31/2022   Procedure: BIOPSY;  Surgeon: Lynann Bologna, DO;  Location: [facility];  Service: Gastroenterology;;   CATARACT EXTRACTION Bilateral    COLONOSCOPY     COLONOSCOPY WITH PROPOFOL N/A 01/31/2022   Procedure: COLONOSCOPY WITH PROPOFOL;  Surgeon: Lynann Bologna, DO;  Location: [facility];  Service: Gastroenterology;  Laterality: N/A;   CYST REMOVAL TRUNK     from back   ESOPHAGOGASTRODUODENOSCOPY (EGD) WITH PROPOFOL N/A 01/31/2022   Procedure: ESOPHAGOGASTRODUODENOSCOPY (EGD) WITH PROPOFOL;  Surgeon: Lynann Bologna, DO;  Location: [facility];  Service: Gastroenterology;  Laterality: N/A;   EYE SURGERY Left 2016   laser to left eye   HIP SURGERY     bone removed from both sides of hip    KNEE ARTHROTOMY Right 12/04/2014   Procedure: KNEE ARTHROTOMY PATELLA LIGAMENT RECONSTRUSION AND REPAIR RIGHT KNEE;  Surgeon: Durene Romans, MD;  Location: [facility];  Service: Orthopedics;  Laterality: Right;   KNEE SURGERY     X 25 TIMES   LEFT HEART CATH AND CORONARY ANGIOGRAPHY N/A 01/17/2018   Procedure: LEFT HEART CATH AND CORONARY ANGIOGRAPHY;  Surgeon: Lyn Records, MD;  Location: [facility];  Service: Cardiovascular;  Laterality: N/A;   LUMBAR DISC SURGERY     Left L3, L4, L5 discecotomy with decompression of L4 root   POLYPECTOMY  01/31/2022   Procedure: POLYPECTOMY;  Surgeon: Lynann Bologna, DO;  Location: [facility];  Service: Gastroenterology;;   TONSILLECTOMY     TOTAL KNEE ARTHROPLASTY  2003   LEFT   TOTAL KNEE ARTHROPLASTY Right 03/23/2014   Procedure: RIGHT TOTAL KNEE ARTHROPLASTY AND REMOVAL RIGHT TIBIAL  DEEP IMPLANT STAPLE;  Surgeon: Durene Romans, MD;  Location: [facility];  Service: Orthopedics;  Laterality: Right;   TOTAL KNEE REVISION  2005   LEFT   WRIST SURGERY     Social History   Socioeconomic History   Marital status: Married    Spouse name: Lynnell Dike   Number of children: 2  Years of education: college   Highest education level: Not on file  Occupational History   Occupation: Disabled    Employer: UNEMPLOYED  Tobacco Use   Smoking status: Former    Current packs/day: 0.00    Average packs/day: 1.5 packs/day for 27.5 years (41.3 ttl pk-yrs)    Types: Cigarettes    Start date: 06/19/1967    Quit date: 01/03/1995    Years since quitting: 28.2    Passive exposure: Never   Smokeless tobacco: Never  Vaping Use   Vaping status: Never Used  Substance and Sexual Activity   Alcohol use: No    Alcohol/week: 0.0 standard drinks of alcohol    Comment: quit drinking in 07/86   Drug use: No   Sexual activity: Yes    Partners: Female  Other Topics Concern   Not on file  Social History Narrative    73 year old, right-handed, caucasian male with a past  medical history of obesity, hypertension, hyperlipidemia, diabetes, obstructive sleep apnea, presenting with frequent nighttime awakenings, excessive daytime sleepiness, also transient confusional episodes.RLS and one beosity, OSA on CPAP with AHI of 3.2 and  setting of 16 cm water , [facility] .   Married since 1977.   Social Drivers of Corporate investment banker Strain: Low Risk  (05/05/2022)   Overall Financial Resource Strain (CARDIA)    Difficulty of Paying Living Expenses: Not hard at all  Food Insecurity: Patient Declined (07/12/2022)   Hunger Vital Sign    Worried About Running Out of Food in the Last Year: Patient declined    Ran Out of Food in the Last Year: Patient declined  Transportation Needs: No Transportation Needs (07/17/2022)   PRAPARE - Administrator, Civil Service (Medical): No    Lack of Transportation (Non-Medical): No  Physical Activity: Insufficiently Active (05/05/2022)   Exercise Vital Sign    Days of Exercise per Week: 3 days    Minutes of Exercise per Session: 30 min  Stress: No Stress Concern Present (05/05/2022)   [employer] of Occupational Health - Occupational Stress Questionnaire    Feeling of Stress : Not at all  Social Connections: Socially Integrated (05/05/2022)   Social Connection and Isolation Panel [NHANES]    Frequency of Communication with Friends and Family: More than three times a week    Frequency of Social Gatherings with Friends and Family: More than three times a week    Attends Religious Services: More than 4 times per year    Active Member of [employer] or Organizations: Yes    Attends Banker Meetings: More than 4 times per year    Marital Status: Married   Outpatient Encounter Medications as of 04/09/2023  Medication Sig   acetaminophen (TYLENOL) 325 MG tablet Take 2 tablets (650 mg total) by mouth every 6 (six) hours as needed for mild pain (or Fever >/= 101). (Patient taking differently: Take 1,000 mg by  mouth every 6 (six) hours as needed for mild pain (pain score 1-3) (or Fever >/= 101).)   albuterol (PROVENTIL) (2.5 MG/3ML) 0.083% nebulizer solution Take 3 mLs (2.5 mg total) by nebulization every 6 (six) hours as needed for up to 7 days for wheezing or shortness of breath.   albuterol (VENTOLIN HFA) 108 (90 Base) MCG/ACT inhaler Inhale 2 puffs into the lungs every 6 (six) hours as needed for wheezing or shortness of breath.   alfuzosin (UROXATRAL) 10 MG 24 hr tablet Take 10 mg by mouth daily.  Alpha Lipoic Acid-Biotin ER 600-450 MG-MCG TBCR Take 600 mg by mouth every morning. (Patient taking differently: Take 600 mg by mouth at bedtime.)   apixaban (ELIQUIS) 5 MG TABS tablet Take 1 tablet (5 mg total) by mouth 2 (two) times daily. For stroke prevention   clobetasol cream (TEMOVATE) 0.05 % Apply thin amount 2 times daily prn to psoriasis not for face axilla or groin   Continuous Glucose Sensor (DEXCOM G7 SENSOR) MISC Change sensor every 10 days   desonide (DESOWEN) 0.05 % cream Apply once daily prn to facial rash   dextromethorphan-guaiFENesin (MUCINEX DM) 30-600 MG 12hr tablet Take 1 tablet by mouth 2 (two) times daily. (Patient taking differently: Take 1 tablet by mouth as needed for cough.)   ezetimibe (ZETIA) 10 MG tablet Take 10 mg by mouth daily.   FIASP FLEXTOUCH 100 UNIT/ML FlexTouch Pen Inject 30-36 Units into the skin in the morning, at noon, and at bedtime.   fluticasone (FLONASE) 50 MCG/ACT nasal spray Place 1 spray into both nostrils daily. (Patient taking differently: Place 1 spray into both nostrils daily as needed.)   Fluticasone-Umeclidin-Vilant (TRELEGY ELLIPTA) 100-62.5-25 MCG/ACT AEPB Inhale 1 puff into the lungs daily.   Fluticasone-Umeclidin-Vilant (TRELEGY ELLIPTA) 100-62.5-25 MCG/ACT AEPB Inhale 1 puff into the lungs daily.   Glucagon (GVOKE HYPOPEN 2-PACK) 1 MG/0.2ML SOAJ Inject 1 mg subcutaneously once as needed for low blood sugar. May repeat dose in 15 minutes as needed  using a new device.   insulin degludec (TRESIBA FLEXTOUCH) 200 UNIT/ML FlexTouch Pen Inject 120 Units into the skin at bedtime.   Insulin Pen Needle (B-D ULTRAFINE III SHORT PEN) 31G X 8 MM MISC USE 1 PEN NEEDLE THREE TIMES DAILY   isosorbide mononitrate (IMDUR) 30 MG 24 hr tablet Take 1 tablet (30 mg total) by mouth daily.   levothyroxine (SYNTHROID) 137 MCG tablet Take 1 tablet (137 mcg total) by mouth daily before breakfast.   loratadine (CLARITIN) 10 MG tablet Take 10 mg by mouth daily as needed for allergies.    losartan (COZAAR) 50 MG tablet Take 50 mg by mouth daily.   metoprolol succinate (TOPROL XL) 25 MG 24 hr tablet Take 1 tablet (25 mg total) by mouth 2 (two) times daily.   metoprolol tartrate (LOPRESSOR) 25 MG tablet Take 25 mg by mouth as needed (palpitations).   modafinil (PROVIGIL) 200 MG tablet Take 1 tablet (200 mg total) by mouth daily.   nitroGLYCERIN (NITROSTAT) 0.4 MG SL tablet DISSOLVE 1 TABLET UNDER TONGUE EVERY 5 MINUTES UP TO 15 MIN FOR CHESTPAIN. IF NO RELIEF CALL 911.   ONE TOUCH ULTRA TEST test strip TEST BLOOD SUGAR UP TO 4 TIMES DAILY.   ONETOUCH DELICA LANCETS 33G MISC USE AS DIRECTED TO TEST BLOOD SUGAR 4 TIMES DAILY.   pravastatin (PRAVACHOL) 40 MG tablet TAKE 1 TABLET BY MOUTH ONCE DAILY IN THE EVENING   rOPINIRole (REQUIP XL) 4 MG 24 hr tablet TAKE 1 TABLET BY MOUTH AT BEDTIME   Semaglutide, 1 MG/DOSE, 4 MG/3ML SOPN Inject 1 mg into the skin once a week.   torsemide (DEMADEX) 20 MG tablet Take 3 tablets (60 mg total) by mouth every morning. & 40 mg in the evening   rOPINIRole (REQUIP) 4 MG tablet Take 1/2 (one-half) tablet by mouth twice daily (Patient not taking: Reported on 04/09/2023)   No facility-administered encounter medications on file as of 04/09/2023.   ALLERGIES: Allergies  Allergen Reactions   Tape Rash    Pulls off skin  Farxiga [Dapagliflozin]     Yeast infections, felt sick with it   Cardizem [Diltiazem Hcl]     Edema    Cardura [Doxazosin  Mesylate]     Headaches / cramps   Crestor [Rosuvastatin] Other (See Comments)    Leg cramps   Lipitor [Atorvastatin] Other (See Comments)    Leg cramps   Paxil [Paroxetine Hcl]     Unknown reaction    Codeine Rash and Other (See Comments)    Headache    Gabapentin Rash   VACCINATION STATUS: Immunization History  Administered Date(s) Administered   Fluad Quad(high Dose 65+) 11/03/2021   Fluad Trivalent(High Dose 65+) 09/20/2022   Influenza Split 09/18/2012   Influenza,inj,Quad PF,6+ Mos 10/13/2013, 10/06/2014, 09/11/2018   Influenza-Unspecified 12/01/2015, 11/08/2016, 11/02/2020   PFIZER(Purple Top)SARS-COV-2 Vaccination 03/06/2019, 03/27/2019   PNEUMOCOCCAL CONJUGATE-20 02/24/2022   Pneumococcal Polysaccharide-23 10/02/2004, 10/03/2011   Td 05/24/2006    Diabetes He presents for his follow-up diabetic visit. He has type 2 diabetes mellitus. Onset time: Was diagnosed at approximate age of 46 years. His disease course has been worsening. There are no hypoglycemic associated symptoms. Pertinent negatives for hypoglycemia include no confusion, headaches, nervousness/anxiousness, pallor, seizures or tremors. Associated symptoms include fatigue and foot paresthesias. Pertinent negatives for diabetes include no chest pain, no polydipsia, no polyphagia, no polyuria, no weakness and no weight loss. There are no hypoglycemic complications. Symptoms are stable. Diabetic complications include a CVA, heart disease, nephropathy and peripheral neuropathy. (Has had 2 mini strokes ) Risk factors for coronary artery disease include diabetes mellitus, dyslipidemia, male sex, obesity, sedentary lifestyle, tobacco exposure and hypertension. Current diabetic treatment includes intensive insulin program (and Ozempic). He is compliant with treatment most of the time. His weight is increasing steadily. He is following a generally healthy diet. When asked about meal planning, he reported none. He has had a  previous visit with a dietitian. He never participates in exercise. His home blood glucose trend is increasing steadily. His overall blood glucose range is >200 mg/dl. (He presents today with his CGM showing above target glycemic profile overall.  His POCT A1c today was 8.5%, increasing from last visit of 7.8%.  Analysis of his CGM shows TIR 24%, TAR 76%, TBR 0% with a GMI of 9.2%.  He has been having to use his steroid inhaler much more recently due to high pollen exacerbating his asthma.  He also notes he has been dealing with more stress lately, his wife was involved in pedestrian hit by car accident and is having to help her more.) An ACE inhibitor/angiotensin II receptor blocker is being taken. He sees a podiatrist.Eye exam is current.  Hyperlipidemia This is a chronic problem. The current episode started more than 1 year ago. The problem is uncontrolled. Recent lipid tests were reviewed and are variable. Exacerbating diseases include chronic renal disease, diabetes, hypothyroidism and obesity. Factors aggravating his hyperlipidemia include fatty foods. Pertinent negatives include no chest pain, myalgias or shortness of breath. Current antihyperlipidemic treatment includes statins. The current treatment provides mild improvement of lipids. Compliance problems include adherence to diet and adherence to exercise.  Risk factors for coronary artery disease include dyslipidemia, diabetes mellitus, hypertension, male sex, obesity and a sedentary lifestyle.  Hypertension This is a chronic problem. The current episode started more than 1 year ago. The problem has been resolved since onset. The problem is controlled. Pertinent negatives include no chest pain, headaches, neck pain, palpitations or shortness of breath. Agents associated with hypertension include thyroid hormones. Risk  factors for coronary artery disease include diabetes mellitus, dyslipidemia, male gender, obesity, sedentary lifestyle and  smoking/tobacco exposure. Past treatments include angiotensin blockers, beta blockers and diuretics. Compliance problems include exercise and diet.  Hypertensive end-organ damage includes kidney disease, CAD/MI and CVA. Identifiable causes of hypertension include chronic renal disease and a thyroid problem.  Thyroid Problem Presents for follow-up visit. Symptoms include fatigue and leg swelling. Patient reports no anxiety, cold intolerance, constipation, depressed mood, diarrhea, heat intolerance, palpitations, tremors, weight gain or weight loss. The symptoms have been stable. His past medical history is significant for diabetes.     Review of systems  Constitutional: + stable body weight,  current Body mass index is 37.48 kg/m. , no fatigue, no subjective hyperthermia, no subjective hypothermia, reports mild memory impairment since mini strokes Eyes: no blurry vision, no xerophthalmia ENT: no sore throat, no nodules palpated in throat, no dysphagia/odynophagia, no hoarseness Cardiovascular: no chest pain, no shortness of breath, no palpitations, no edema Respiratory: no cough, no shortness of breath Gastrointestinal: no nausea/vomiting/diarrhea Musculoskeletal: no muscle/joint aches Skin: no rashes, no hyperemia Neurological: no tremors, no numbness, no tingling, no dizziness, + intermittent numbness/tingling to bilateral feet Psychiatric: no depression, no anxiety    Objective:    BP 128/64 (BP Location: Left Arm, Patient Position: Sitting, Cuff Size: Large)   Pulse 72   Ht 5\' 5"  (1.651 m)   Wt 225 lb 3.2 oz (102.2 kg)   BMI 37.48 kg/m   Wt Readings from Last 3 Encounters:  04/09/23 225 lb 3.2 oz (102.2 kg)  02/01/23 225 lb 8 oz (102.3 kg)  12/22/22 226 lb 6.4 oz (102.7 kg)     BP Readings from Last 3 Encounters:  04/09/23 128/64  02/12/23 126/64  02/05/23 (!) 147/74     Physical Exam- Limited  Constitutional:  Body mass index is 37.48 kg/m. , not in acute distress,  normal state of mind Eyes:  EOMI, no exophthalmos Musculoskeletal: no gross deformities, strength intact in all four extremities, no gross restriction of joint movements Skin:  no rashes, no hyperemia Neurological: no tremor with outstretched hands   Diabetic Foot Exam - Simple   Simple Foot Form Diabetic Foot exam was performed with the following findings: Yes 04/09/2023  8:58 AM  Visual Inspection No deformities, no ulcerations, no other skin breakdown bilaterally: Yes Sensation Testing Intact to touch and monofilament testing bilaterally: Yes Pulse Check Posterior Tibialis and Dorsalis pulse intact bilaterally: Yes Comments        Latest Ref Rng & Units 01/25/2023    9:39 AM 01/01/2023   11:59 AM 12/04/2022    9:31 AM  CMP  Glucose 70 - 99 mg/dL 098  119  147   BUN 8 - 23 mg/dL 45  35  39   Creatinine 0.61 - 1.24 mg/dL 8.29  5.62  1.30   Sodium 135 - 145 mmol/L 138  141  139   Potassium 3.5 - 5.1 mmol/L 4.7  4.7  4.6   Chloride 98 - 111 mmol/L 103  103  100   CO2 22 - 32 mmol/L 25  21  23    Calcium 8.9 - 10.3 mg/dL 9.0  9.3  9.6   Total Protein 6.5 - 8.1 g/dL 7.4   7.0   Total Bilirubin 0.0 - 1.2 mg/dL 0.4   0.2   Alkaline Phos 38 - 126 U/L 58   88   AST 15 - 41 U/L 21   11   ALT 0 -  44 U/L 19   13      Diabetic Labs (most recent): Lab Results  Component Value Date   HGBA1C 8.5 (A) 04/09/2023   HGBA1C 7.8 (A) 12/08/2022   HGBA1C 8.9 (H) 07/11/2022   MICROALBUR 10 06/09/2021   MICROALBUR 9.5 02/06/2019   MICROALBUR 4.6 10/08/2017   Lipid Panel     Component Value Date/Time   CHOL 150 05/05/2022 0938   TRIG 206 (H) 05/05/2022 0938   HDL 43 05/05/2022 0938   CHOLHDL 3.5 05/05/2022 0938   CHOLHDL 3.2 08/05/2019 0334   VLDL 19 08/05/2019 0334   LDLCALC 73 05/05/2022 0938   LDLCALC 118 (H) 02/06/2019 0813     Assessment & Plan:   1) Uncontrolled type 2 diabetes mellitus with circulatory complication, with long-term current use of insulin (HCC)  -His  diabetes is complicated by coronary artery disease and patient remains at an extremely high risk for more acute and chronic complications of diabetes which include CAD, CVA, CKD, retinopathy, and neuropathy. These are all discussed in detail with the patient.  He presents today with his CGM showing above target glycemic profile overall.  His POCT A1c today was 8.5%, increasing from last visit of 7.8%.  Analysis of his CGM shows TIR 24%, TAR 76%, TBR 0% with a GMI of 9.2%.  He has been having to use his steroid inhaler much more recently due to high pollen exacerbating his asthma.  He also notes he has been dealing with more stress lately, his wife was involved in pedestrian hit by car accident and is having to help her more.  He notes he did not get Fiasp or pen needles in his recent PAP shipment.     Glucose logs and insulin administration records pertaining to this visit,  to be scanned into patient's records.  Recent labs reviewed.  - Nutritional counseling repeated at each appointment due to patients tendency to fall back in to old habits.  - The patient admits there is a room for improvement in their diet and drink choices. -  Suggestion is made for the patient to avoid simple carbohydrates from their diet including Cakes, Sweet Desserts / Pastries, Ice Cream, Soda (diet and regular), Sweet Tea, Candies, Chips, Cookies, Sweet Pastries, Store Bought Juices, Alcohol in Excess of 1-2 drinks a day, Artificial Sweeteners, Coffee Creamer, and "Sugar-free" Products. This will help patient to have stable blood glucose profile and potentially avoid unintended weight gain.   - I encouraged the patient to switch to unprocessed or minimally processed complex starch and increased protein intake (animal or plant source), fruits, and vegetables.   - Patient is advised to stick to a routine mealtimes to eat 3 meals a day and avoid unnecessary snacks (to snack only to correct hypoglycemia).  - I have approached  patient with the following individualized plan to manage diabetes and patient agrees.  -He is advised to increase his [place] to 120 units SQ nightly, increase Fiasp 34-40 units SQ TID with meals if glucose is above 90 and he is eating (Specific instructions on how to titrate insulin dosage based on glucose readings given to patient in writing).  Will also continue his Ozempic 2 mg SQ weekly as well.  Will send in refill form to PAP.  -He is encouraged to continue using his CGM to monitor glucose 4 times daily, before meals and before bed, and to call the clinic if he has readings less than 70 or greater than 300 for 3 tests in  a row.  2) BP/HTN:  His blood pressure is controlled to target for his age.  He is advised to continue Lasix 40 mg po daily, Losartan 50 mg po daily, Norvasc 5 mg po daily, and Metoprolol 75 mg po twice daily.  Will defer changes to cardiology.  3) Lipids/HPL:  His most recent lipid panel from 05/05/22 shows controlled LDL of 73and elevated triglycerides of 206.  He is advised to continue Pravastatin 40 mg po daily at bedtime.  Side effects and precautions discussed with him.    4)  Weight/Diet:  His Body mass index is 37.48 kg/m.-complicating his diabetes care.  He is a candidate for modest weight loss.  CDE consult in progress, exercise, and carbohydrates information provided.  5) Hypothyroidism -There are no recent TFTs to review.  He is advised to continue current dose of Levothyroxine at 137 mcg po daily before breakfast.  Will recheck TFTs prior to next visit and adjust dose accordingly.   - We discussed about the correct intake of his thyroid hormone, on empty stomach at fasting, with water, separated by at least 30 minutes from breakfast and other medications,  and separated by more than 4 hours from calcium, iron, multivitamins, acid reflux medications (PPIs). -Patient is made aware of the fact that thyroid hormone replacement is needed for life, dose to be adjusted  by periodic monitoring of thyroid function tests.  6) Chronic Care/Health Maintenance: -Patient is on ACEI/ARB and Statin medications and encouraged to continue to follow up with Ophthalmology, Podiatrist at least yearly or according to recommendations, and advised to  stay away from smoking. I have recommended yearly flu vaccine and pneumonia vaccination at least every 5 years; moderate intensity exercise for up to 150 minutes weekly; and  sleep for at least 7 hours a day.  - I advised patient to maintain close follow up with [name], MD for primary care needs.     I spent  49  minutes in the care of the patient today including review of labs from CMP, Lipids, Thyroid Function, Hematology (current and previous including abstractions from other facilities); face-to-face time discussing  his blood glucose readings/logs, discussing hypoglycemia and hyperglycemia episodes and symptoms, medications doses, his options of short and long term treatment based on the latest standards of care / guidelines;  discussion about incorporating lifestyle medicine;  and documenting the encounter. Risk reduction counseling performed per USPSTF guidelines to reduce obesity and cardiovascular risk factors.     Please refer to Patient Instructions for Blood Glucose Monitoring and Insulin/Medications Dosing Guide"  in media tab for additional information. Please  also refer to " Patient Self Inventory" in the Media  tab for reviewed elements of pertinent patient history.  Paul Baker participated in the discussions, expressed understanding, and voiced agreement with the above plans.  All questions were answered to his satisfaction. he is encouraged to contact clinic should he have any questions or concerns prior to his return visit.   Follow up plan: -Return in about 4 months (around 08/09/2023) for Diabetes F/U with A1c in office, Previsit labs, Thyroid follow up, Bring meter and logs.  Ronny Bacon,  [facility] [address] [place], [place] [address] Phone: [phone] Fax: [phone]  04/09/2023, 9:12 AM

## 2023-04-09 NOTE — Patient Instructions (Signed)

## 2023-04-21 ENCOUNTER — Other Ambulatory Visit: Payer: Self-pay | Admitting: Cardiology

## 2023-04-23 ENCOUNTER — Ambulatory Visit: Payer: Medicare HMO | Admitting: Family Medicine

## 2023-04-23 ENCOUNTER — Ambulatory Visit (INDEPENDENT_AMBULATORY_CARE_PROVIDER_SITE_OTHER): Payer: Medicare HMO | Admitting: Family Medicine

## 2023-04-23 VITALS — BP 122/68 | HR 85 | Temp 98.2°F | Ht 65.0 in | Wt 224.2 lb

## 2023-04-23 DIAGNOSIS — E538 Deficiency of other specified B group vitamins: Secondary | ICD-10-CM | POA: Diagnosis not present

## 2023-04-23 DIAGNOSIS — R0609 Other forms of dyspnea: Secondary | ICD-10-CM | POA: Diagnosis not present

## 2023-04-23 DIAGNOSIS — E785 Hyperlipidemia, unspecified: Secondary | ICD-10-CM | POA: Diagnosis not present

## 2023-04-23 DIAGNOSIS — E1169 Type 2 diabetes mellitus with other specified complication: Secondary | ICD-10-CM | POA: Diagnosis not present

## 2023-04-23 DIAGNOSIS — J4550 Severe persistent asthma, uncomplicated: Secondary | ICD-10-CM

## 2023-04-23 DIAGNOSIS — E1159 Type 2 diabetes mellitus with other circulatory complications: Secondary | ICD-10-CM | POA: Diagnosis not present

## 2023-04-23 DIAGNOSIS — I5032 Chronic diastolic (congestive) heart failure: Secondary | ICD-10-CM | POA: Diagnosis not present

## 2023-04-23 NOTE — Progress Notes (Signed)
 Subjective:    Patient ID: Paul Baker, male    DOB: 1950/11/15, 73 y.o.   MRN: [id number]  HPI PT comes in today for 5 month follow up with complaints of skin "stinging" to the touch. Medications and allergies reviewed.  Discussed the use of AI scribe software for clinical note transcription with the patient, who gave verbal consent to proceed.  History of Present Illness   Paul Baker is a 73 year old male with asthma and diabetes who presents with worsening shortness of breath and congestion.  Over the past year, he has experienced worsening shortness of breath, particularly during activities such as walking from his car to a store or around large places like [employer]. He often needs to stop to catch his breath and experiences pain in his back and hips during these episodes. The shortness of breath has been progressively worsening, and he sometimes experiences wheezing. He is currently taking Trelegy for his asthma.  He describes persistent congestion and coughing, primarily at night, which sometimes causes him to choke in his sleep. He is unsure if the mucus is due to postnasal drainage or lung production. He uses a CPAP machine, and his sleep neurologist monitors the data from it.  He has a history of diabetes, with recent fluctuations in his A1c levels. He is on multiple medications including Tresiba  120 units, PS 34-40 units with meals, and Ozempic . He has experienced low blood sugar episodes, with a recent drop to 55 mg/dL, which he manages by consuming chocolate milk or food.  He reports numbness and burning sensations in his feet, and stinging skin from the waist up, which makes wearing a shirt painful. He has been taking cortisone recently, which he believes affected his blood sugar control.  He has a history of coronary artery disease with previous blockages that have improved over time. He attributes some improvement to dietary changes, such as eating almonds  daily. He is on medications to support heart function and has had previous diagnostic studies including an echocardiogram and a specialized CT scan of his arteries.  He experiences restless legs syndrome, which affects his entire body. He is on extended-release and regular Requip , taking 4 mg extended-release at night and splitting the regular dose between morning and night. He also takes magnesium , which he finds helpful.  He has been receiving iron  injections for low ferritin levels but has not noticed significant improvement. His last hemoglobin was 10.8 g/dL, and he is scheduled for further blood work to monitor his iron  levels.  He experiences swelling in his lower legs, which varies in severity. He takes 60 mg of a diuretic in the morning and an additional 40 mg in the afternoon if needed. He notes that the diuretic sometimes increases urination.      Review of Systems     Objective:   Physical Exam  General-in no acute distress Eyes-no discharge Lungs-respiratory rate normal, CTA CV-no murmurs,RRR Extremities skin warm dry no edema Neuro grossly normal Behavior normal, alert       Assessment & Plan:   Assessment and Plan    Diabetes with complications A1c increased, likely due to illness and cortisone. Insulin  adjusted. On Tresiba , PS insulin , and Ozempic . Occasional hypoglycemia. Eye exams current. - Order cholesterol blood test. - Order urine test for protein. - Order B12 blood test.  Coronary artery disease Significant blockages improved, possibly due to diet. On heart medications. Recent imaging showed non-critical blockages. Cardiologist follow-up planned. - Contact cardiologist to discuss  need for follow-up echocardiogram.  Shortness of breath and congestion Shortness of breath with exertion and congestion, especially at night. Uses Trelegy. May be cardiac and pulmonary related. Back and hip pain with exertion may be related. - Contact cardiologist to discuss  need for follow-up echocardiogram.  Hypertension Blood pressure well-controlled at 128/58 mmHg.  Peripheral neuropathy Numbness and burning in feet, skin sensitivity. Low-normal B12 a year ago may contribute. - Order B12 blood test.  Restless legs syndrome Affects entire body. Managed with Requip  and magnesium . Symptoms improved but not fully controlled. Possible B12 deficiency contribution.  Sleep apnea Uses CPAP, monitored by sleep neurologist. Experiences choking at night, possibly due to mucus.      1. Hyperlipidemia associated with type 2 diabetes mellitus (HCC) (Primary) Patient does not tolerate statins well, cardiology addressing this issue - Lipid Panel  2. DOE (dyspnea on exertion) Significant increase in dyspnea on exertion over the past 6 months.  He feels his asthma is very similar to what it has been utilizing the same treatments He also relates some swelling in the ankles.  We will do a BNP.  In addition to this we will touch base with cardiology to see if he may need up-to-date echo of the heart Patient has had previous testing recently which did not show any critical blockages - Brain natriuretic peptide  3. DM type 2 causing vascular disease (HCC) Patient's A1c went up he is trying to do better with diet along with the medications to help get this under better control - Microalbumin/Creatinine Ratio, Urine  4. Vitamin B12 deficiency He does have peripheral neuropathy we will check a B12 level - Vitamin B12  5. Severe persistent asthma without complication He will continue his current treatments from pulmonary  6. Chronic heart failure with preserved ejection fraction [facility]) He is on multiple medicines followed by cardiology for this, communication was sent to a cardiologist - Brain natriuretic peptide  7. Obesity, morbid (HCC) Portion control regular physical activity

## 2023-04-24 ENCOUNTER — Other Ambulatory Visit: Payer: Self-pay | Admitting: *Deleted

## 2023-04-24 ENCOUNTER — Encounter: Payer: Self-pay | Admitting: Family Medicine

## 2023-04-24 ENCOUNTER — Telehealth: Payer: Self-pay | Admitting: Nurse Practitioner

## 2023-04-24 DIAGNOSIS — I3139 Other pericardial effusion (noninflammatory): Secondary | ICD-10-CM

## 2023-04-24 DIAGNOSIS — I5030 Unspecified diastolic (congestive) heart failure: Secondary | ICD-10-CM

## 2023-04-24 NOTE — Telephone Encounter (Signed)
 Pt picked up pt assistance of pen needles and fiasp 

## 2023-04-24 NOTE — Telephone Encounter (Signed)
 Let Pt know pt assistance is here

## 2023-04-25 ENCOUNTER — Other Ambulatory Visit: Payer: Self-pay

## 2023-04-25 DIAGNOSIS — Z125 Encounter for screening for malignant neoplasm of prostate: Secondary | ICD-10-CM

## 2023-04-25 NOTE — Telephone Encounter (Signed)
 Nurses Please add PSA-screening prostate cancer to his lab work that was just ordered a couple days ago He will be going Friday to have this lab completed thank you  Please send Paul Baker a message that this was completed thank you

## 2023-04-26 DIAGNOSIS — E1159 Type 2 diabetes mellitus with other circulatory complications: Secondary | ICD-10-CM | POA: Diagnosis not present

## 2023-04-26 DIAGNOSIS — E1169 Type 2 diabetes mellitus with other specified complication: Secondary | ICD-10-CM | POA: Diagnosis not present

## 2023-04-26 DIAGNOSIS — R0609 Other forms of dyspnea: Secondary | ICD-10-CM | POA: Diagnosis not present

## 2023-04-26 DIAGNOSIS — E785 Hyperlipidemia, unspecified: Secondary | ICD-10-CM | POA: Diagnosis not present

## 2023-04-26 DIAGNOSIS — E538 Deficiency of other specified B group vitamins: Secondary | ICD-10-CM | POA: Diagnosis not present

## 2023-04-26 DIAGNOSIS — I5032 Chronic diastolic (congestive) heart failure: Secondary | ICD-10-CM | POA: Diagnosis not present

## 2023-04-26 DIAGNOSIS — Z125 Encounter for screening for malignant neoplasm of prostate: Secondary | ICD-10-CM | POA: Diagnosis not present

## 2023-04-27 ENCOUNTER — Encounter: Payer: Self-pay | Admitting: Family Medicine

## 2023-04-27 LAB — MICROALBUMIN / CREATININE URINE RATIO
Creatinine, Urine: 50.7 mg/dL
Microalb/Creat Ratio: 28 mg/g{creat} (ref 0–29)
Microalbumin, Urine: 14.4 ug/mL

## 2023-04-27 LAB — LIPID PANEL
Chol/HDL Ratio: 3.5 ratio (ref 0.0–5.0)
Cholesterol, Total: 126 mg/dL (ref 100–199)
HDL: 36 mg/dL — ABNORMAL LOW (ref >39)
LDL Chol Calc (NIH): 47 mg/dL (ref 0–99)
Triglycerides: 281 mg/dL — ABNORMAL HIGH (ref 0–149)
VLDL Cholesterol Cal: 43 mg/dL — ABNORMAL HIGH (ref 5–40)

## 2023-04-27 LAB — PSA: Prostate Specific Ag, Serum: 4.5 ng/mL — ABNORMAL HIGH (ref 0.0–4.0)

## 2023-04-27 LAB — VITAMIN B12: Vitamin B-12: 395 pg/mL (ref 232–1245)

## 2023-04-27 LAB — BRAIN NATRIURETIC PEPTIDE: BNP: 6.4 pg/mL (ref 0.0–100.0)

## 2023-04-30 ENCOUNTER — Encounter: Payer: Self-pay | Admitting: Family Medicine

## 2023-04-30 NOTE — Telephone Encounter (Signed)
 Nurses Please fax the result to [facility] of his most recent PSA from April 24

## 2023-05-09 ENCOUNTER — Telehealth: Payer: Self-pay | Admitting: *Deleted

## 2023-05-09 NOTE — Telephone Encounter (Deleted)
 Left VM for patient to bring his CPAP machine or SD card with him to his appointment on 5/8.  Spoke with Rodman Clam with [employer], patient is active with [facility] requested to be tagged in [employer].

## 2023-05-10 ENCOUNTER — Ambulatory Visit: Admitting: Adult Health

## 2023-05-10 ENCOUNTER — Ambulatory Visit (INDEPENDENT_AMBULATORY_CARE_PROVIDER_SITE_OTHER)

## 2023-05-10 ENCOUNTER — Encounter: Payer: Self-pay | Admitting: Adult Health

## 2023-05-10 VITALS — BP 118/62 | HR 79 | Temp 97.7°F | Ht 64.0 in | Wt 228.4 lb

## 2023-05-10 DIAGNOSIS — J4541 Moderate persistent asthma with (acute) exacerbation: Secondary | ICD-10-CM

## 2023-05-10 DIAGNOSIS — R918 Other nonspecific abnormal finding of lung field: Secondary | ICD-10-CM | POA: Diagnosis not present

## 2023-05-10 DIAGNOSIS — R059 Cough, unspecified: Secondary | ICD-10-CM | POA: Diagnosis not present

## 2023-05-10 DIAGNOSIS — R062 Wheezing: Secondary | ICD-10-CM | POA: Diagnosis not present

## 2023-05-10 MED ORDER — AZITHROMYCIN 250 MG PO TABS: ORAL_TABLET | ORAL | 0 refills | Status: AC

## 2023-05-10 NOTE — Patient Instructions (Addendum)
 ZPack take as directed.  Liquid Mucinex  DM Twice daily  As needed  Cough and congestion  Continue on Trelegy 1 puff daily, rinse after use.  Chest xray today .  Albuterol  inhaler or Neb As needed   Activity as tolerated  Claritin  daily  Follow up with cardiology as discussed  Follow up in 2-3 months and As needed   Please contact office for sooner follow up if symptoms do not improve or worsen or seek emergency care

## 2023-05-10 NOTE — Progress Notes (Signed)
 @Patient  ID: Paul Baker, male    DOB: 1950/08/07, 73 y.o.   MRN: [id number]  Chief Complaint  Patient presents with   Follow-up    Referring provider: Bennet Brasil, MD  HPI: 73 yo male former smoker followed for severe persistent asthma  Medical significant for obstructive sleep apnea on CPAP and restless leg syndrome followed by [facility] neurology.  History of coronary artery disease, fatty liver disease, right subclavian vein stenosis, spinal stenosis, DVT, psoriasis, septic arthritis in the left knee, diabetes, A-fib on Eliquis .  Diabetes , CKD    TEST/EVENTS :  Spirometry 10/17/11 >> 2.58 (89%), FEV1% 85 PFT 09/11/18 >> FEV1 2.19 (79%), FEV1% 82, TLC 4.48 (74%), DLCO 70%, +BD from change in FEF 25-75% 09/23/20 PFT normal FEV1 87%, ratio 86, FVC 74%, no significant bronchodilator response, DLCO 94%   HRCT chest 12/19/18 >> atherosclerosis, patchy air trapping, fatty liver, neg ILD    Chest x-ray February 2022 no acute pulmonary disease   PSG 2012 >> AHI 60   LHC 01/17/18 >> non obstructive CAD Echo 02/21/18 >> EF 55 to 60%  05/10/2023 Follow up ; Asthma  Discussed the use of AI scribe software for clinical note transcription with the patient, who gave verbal consent to proceed.  History of Present Illness   Paul Baker is a 73 year old male with asthma presents for follow up visit. Former patient of Dr. Matilde Son , last seen 09/2022.  Has extensive medical history with coronary artery disease A-fib, diastolic heart failure, chronic kidney disease . Complains that since last visit his breathing has not been doing as well.  Over the last couple weeks has been more short of breath with activity.  He has experienced worsening shortness of breath over time, particularly when walking to his mailbox, which is 90 feet from his house. He needs to stop and rest during this activity. In the past week, he has been able to walk down and back but experiences severe breathlessness and  neck pain, which takes a while to recover from. He has been coughing and wheezing, with mornings being particularly difficult due to a sensation of choking from phlegm buildup. He has been coughing up thick, tan-colored mucus and has experienced worsening breathing and increased wheezing over the past two weeks. He uses an albuterol  inhaler but has not used his nebulizer recently. He is on Trelegy once a day for asthma management, although he mentions the high cost of this medication.  He is followed by cardiology for congestive heart failure, A-fib, coronary artery disease, angina.  Endorses compliance with his cardiac regimen. Aaron Aas He is on Imdur  for angina. Gets chest tightness and pain on occasion. He experiences chest pain but has not had any episodes of syncope. He has a history of atrial fibrillation and is currently on Eliquis  twice a day for stroke prevention. Aaron Aas He reports that sometimes his arm gets numb, which has been ongoing for a while.  Currently denies any chest pain.  He is on torasemide, taking 60 mg in the morning and 40 mg in the evening, but still experiences edema. He weighs himself daily and notes that his weight was down today compared to usual.  Says his leg swelling has been improved recently.  He has diabetes that is insulin -dependent and notes that prednisone  causes his blood sugars to rise significantly, with previous levels reaching the 400s. His last A1c was high, attributed to recent prednisone  use.       Allergies  Allergen Reactions   Tape Rash    Pulls off skin   Farxiga [Dapagliflozin]     Yeast infections, felt sick with it   Cardizem  [Diltiazem  Hcl]     Edema    Cardura [Doxazosin Mesylate]     Headaches / cramps   Crestor  [Rosuvastatin ] Other (See Comments)    Leg cramps   Lipitor [Atorvastatin ] Other (See Comments)    Leg cramps   Paxil [Paroxetine Hcl]     Unknown reaction    Codeine Rash and Other (See Comments)    Headache    Gabapentin Rash     Immunization History  Administered Date(s) Administered   Fluad Quad(high Dose 65+) 11/03/2021   Fluad Trivalent(High Dose 65+) 09/20/2022   Influenza Split 09/18/2012   Influenza,inj,Quad PF,6+ Mos 10/13/2013, 10/06/2014, 09/11/2018   Influenza-Unspecified 12/01/2015, 11/08/2016, 11/02/2020   PFIZER(Purple Top)SARS-COV-2 Vaccination 03/06/2019, 03/27/2019   PNEUMOCOCCAL CONJUGATE-20 02/24/2022   Pneumococcal Polysaccharide-23 10/02/2004, 10/03/2011   Td 05/24/2006    Past Medical History:  Diagnosis Date   Arthritis    Asthma    Atrial fibrillation (HCC)    Diagnosed December 2021   Colon polyp    Coronary atherosclerosis of native coronary artery    a. 2011: cath showing 90% stenosis along small non-dominant RCA (too small for PCI). b. 01/2018: cath showing nonobstructive CAD with 60 to 70% proximal to mid nondominant RCA stenosis, 50% mid LAD and 40 to 50% OM1   DVT (deep venous thrombosis) (HCC) 2005   Right arm   Essential hypertension    Headache    History of transfusion    Hypothyroidism    Mixed hyperlipidemia    Morbid obesity (HCC)    MRSA (methicillin resistant staph aureus) culture positive    08/2012   Narcolepsy    OSA (obstructive sleep apnea)    CPAP   Osteoarthritis    Pneumonia    Psoriasis    Pulmonary embolism (HCC) 2004   RLS (restless legs syndrome)    Rotator cuff disorder    Left   Septic arthritis of knee, left (HCC)    Skin cancer, basal cell    Type 2 diabetes mellitus (HCC)     Tobacco History: Social History   Tobacco Use  Smoking Status Former   Current packs/day: 0.00   Average packs/day: 1.5 packs/day for 27.5 years (41.3 ttl pk-yrs)   Types: Cigarettes   Start date: 06/19/1967   Quit date: 01/03/1995   Years since quitting: 28.3   Passive exposure: Never  Smokeless Tobacco Never   Counseling given: Not Answered   Outpatient Medications Prior to Visit  Medication Sig Dispense Refill   acetaminophen  (TYLENOL ) 325 MG  tablet Take 2 tablets (650 mg total) by mouth every 6 (six) hours as needed for mild pain (or Fever >/= 101). (Patient taking differently: Take 1,000 mg by mouth every 6 (six) hours as needed for mild pain (pain score 1-3) (or Fever >/= 101).) 30 tablet 1   albuterol  (PROVENTIL ) (2.5 MG/3ML) 0.083% nebulizer solution Take 3 mLs (2.5 mg total) by nebulization every 6 (six) hours as needed for up to 7 days for wheezing or shortness of breath. 84 mL 3   albuterol  (VENTOLIN  HFA) 108 (90 Base) MCG/ACT inhaler Inhale 2 puffs into the lungs every 6 (six) hours as needed for wheezing or shortness of breath. 18 g 5   alfuzosin  (UROXATRAL ) 10 MG 24 hr tablet Take 10 mg by mouth daily.     Alpha Lipoic  Acid-Biotin  ER 600-450 MG-MCG TBCR Take 600 mg by mouth every morning. (Patient taking differently: Take 600 mg by mouth at bedtime.)     apixaban  (ELIQUIS ) 5 MG TABS tablet Take 1 tablet (5 mg total) by mouth 2 (two) times daily. For stroke prevention 60 tablet 5   clobetasol  cream (TEMOVATE ) 0.05 % Apply thin amount 2 times daily prn to psoriasis not for face axilla or groin 45 g 1   Continuous Glucose Sensor (DEXCOM G7 SENSOR) MISC Change sensor every 10 days 3 each 3   Cyanocobalamin  (VITAMIN B 12 PO) Take by mouth.     desonide  (DESOWEN ) 0.05 % cream Apply once daily prn to facial rash 30 g 0   dextromethorphan -guaiFENesin  (MUCINEX  DM) 30-600 MG 12hr tablet Take 1 tablet by mouth 2 (two) times daily. (Patient taking differently: Take 1 tablet by mouth as needed for cough.) 20 tablet 0   ezetimibe  (ZETIA ) 10 MG tablet Take 10 mg by mouth daily.     FIASP  FLEXTOUCH 100 UNIT/ML FlexTouch Pen Inject 30-36 Units into the skin in the morning, at noon, and at bedtime.     fluticasone  (FLONASE ) 50 MCG/ACT nasal spray Place 1 spray into both nostrils daily. (Patient taking differently: Place 1 spray into both nostrils daily as needed.) 16 g 2   Fluticasone -Umeclidin-Vilant (TRELEGY ELLIPTA ) 100-62.5-25 MCG/ACT AEPB  Inhale 1 puff into the lungs daily. 60 each 5   Glucagon  (GVOKE HYPOPEN  2-PACK) 1 MG/0.2ML SOAJ Inject 1 mg subcutaneously once as needed for low blood sugar. May repeat dose in 15 minutes as needed using a new device. (Patient taking differently: 2 mg. Inject 2 mg subcutaneously once as needed for low blood sugar. May repeat dose in 15 minutes as needed using a new device.) 0.4 mL 1   insulin  degludec (TRESIBA  FLEXTOUCH) 200 UNIT/ML FlexTouch Pen Inject 120 Units into the skin at bedtime.     Insulin  Pen Needle (B-D ULTRAFINE III SHORT PEN) 31G X 8 MM MISC USE 1 PEN NEEDLE THREE TIMES DAILY 100 each 2   isosorbide  mononitrate (IMDUR ) 30 MG 24 hr tablet Take 1 tablet (30 mg total) by mouth daily. 90 tablet 1   levothyroxine  (SYNTHROID ) 137 MCG tablet Take 1 tablet (137 mcg total) by mouth daily before breakfast. 90 tablet 3   loratadine  (CLARITIN ) 10 MG tablet Take 10 mg by mouth daily as needed for allergies.      losartan  (COZAAR ) 50 MG tablet Take 50 mg by mouth daily.     magnesium  30 MG tablet Take by mouth daily.     metoprolol  succinate (TOPROL  XL) 25 MG 24 hr tablet Take 1 tablet (25 mg total) by mouth 2 (two) times daily. 270 tablet 1   metoprolol  tartrate (LOPRESSOR ) 25 MG tablet Take 25 mg by mouth as needed (palpitations).     modafinil  (PROVIGIL ) 200 MG tablet Take 1 tablet (200 mg total) by mouth daily. 30 tablet 5   nitroGLYCERIN  (NITROSTAT ) 0.4 MG SL tablet DISSOLVE 1 TABLET UNDER TONGUE EVERY 5 MINUTES UP TO 15 MIN FOR CHESTPAIN. IF NO RELIEF CALL 911. 25 tablet 3   ONE TOUCH ULTRA TEST test strip TEST BLOOD SUGAR UP TO 4 TIMES DAILY. 150 each 5   ONETOUCH DELICA LANCETS 33G MISC USE AS DIRECTED TO TEST BLOOD SUGAR 4 TIMES DAILY. 150 each 5   pravastatin  (PRAVACHOL ) 40 MG tablet TAKE 1 TABLET BY MOUTH ONCE DAILY IN THE EVENING 90 tablet 2   rOPINIRole  (REQUIP  XL) 4 MG 24  hr tablet TAKE 1 TABLET BY MOUTH AT BEDTIME 90 tablet 1   rOPINIRole  (REQUIP ) 4 MG tablet Take 1/2 (one-half)  tablet by mouth twice daily 90 tablet 0   Semaglutide , 1 MG/DOSE, 4 MG/3ML SOPN Inject 1 mg into the skin once a week. 9 mL 1   torsemide  (DEMADEX ) 20 MG tablet Take 3 tablets (60 mg total) by mouth every morning. & 40 mg in the evening 450 tablet 1   Fluticasone -Umeclidin-Vilant (TRELEGY ELLIPTA ) 100-62.5-25 MCG/ACT AEPB Inhale 1 puff into the lungs daily. 60 each 0   No facility-administered medications prior to visit.     Review of Systems:   Constitutional:   No  weight loss, night sweats,  Fevers, chills, +fatigue, or  lassitude.  HEENT:   No headaches,  Difficulty swallowing,  Tooth/dental problems, or  Sore throat,                No sneezing, itching, ear ache, +nasal congestion, post nasal drip,   CV:  No chest pain,  Orthopnea, PND, swelling in lower extremities, anasarca, dizziness, palpitations, syncope.   GI  No heartburn, indigestion, abdominal pain, nausea, vomiting, diarrhea, change in bowel habits, loss of appetite, bloody stools.   Resp: .  No chest wall deformity  Skin: no rash or lesions.  GU: no dysuria, change in color of urine, no urgency or frequency.  No flank pain, no hematuria   MS:  No joint pain or swelling.  No decreased range of motion.  No back pain.    Physical Exam  BP 118/62 (BP Location: Left Arm, Patient Position: Sitting, Cuff Size: Large)   Pulse 79   Temp 97.7 F (36.5 C) (Oral)   Ht 5\' 4"  (1.626 m)   Wt 228 lb 6.4 oz (103.6 kg)   SpO2 97%   BMI 39.20 kg/m   GEN: A/Ox3; pleasant , NAD, well nourished    HEENT:  [place]/AT,  EACs-clear, TMs-wnl, NOSE-clear, THROAT-clear, no lesions, no postnasal drip or exudate noted.   NECK:  Supple w/ fair ROM; no JVD; normal carotid impulses w/o bruits; no thyromegaly or nodules palpated; no lymphadenopathy.    RESP  Clear  P & A; w/o, wheezes/ rales/ or rhonchi. no accessory muscle use, no dullness to percussion  CARD:  RRR, no m/r/g, 1+ peripheral edema, pulses intact, no cyanosis or  clubbing.  GI:   Soft & nt; nml bowel sounds; no organomegaly or masses detected.   Musco: Warm bil, no deformities or joint swelling noted.   Neuro: alert, no focal deficits noted.    Skin: Warm, no lesions or rashes    Lab Results:  CBC    Component Value Date/Time   WBC 8.9 01/25/2023 0939   RBC 3.74 (L) 01/25/2023 0939   HGB 10.8 (L) 01/25/2023 0939   HGB 11.7 (L) 10/05/2022 1632   HCT 34.4 (L) 01/25/2023 0939   HCT 35.8 (L) 10/05/2022 1632   PLT 229 01/25/2023 0939   PLT 308 10/05/2022 1632   MCV 92.0 01/25/2023 0939   MCV 92 10/05/2022 1632   MCH 28.9 01/25/2023 0939   MCHC 31.4 01/25/2023 0939   RDW 14.8 01/25/2023 0939   RDW 14.4 10/05/2022 1632   LYMPHSABS 1.5 01/25/2023 0939   LYMPHSABS 1.6 09/01/2021 1506   MONOABS 1.0 01/25/2023 0939   EOSABS 0.2 01/25/2023 0939   EOSABS 0.3 09/01/2021 1506   BASOSABS 0.1 01/25/2023 0939   BASOSABS 0.1 09/01/2021 1506    BMET    Component  Value Date/Time   NA 138 01/25/2023 0939   NA 141 01/01/2023 1159   K 4.7 01/25/2023 0939   CL 103 01/25/2023 0939   CO2 25 01/25/2023 0939   GLUCOSE 171 (H) 01/25/2023 0939   BUN 45 (H) 01/25/2023 0939   BUN 35 (H) 01/01/2023 1159   CREATININE 1.37 (H) 01/25/2023 0939   CREATININE 1.11 02/06/2019 0813   CALCIUM  9.0 01/25/2023 0939   GFRNONAA 55 (L) 01/25/2023 0939   GFRNONAA 68 02/06/2019 0813   GFRAA 87 12/04/2019 0832   GFRAA 79 02/06/2019 0813    BNP    Component Value Date/Time   BNP 6.4 04/26/2023 0845   BNP 81.0 07/11/2022 1032    ProBNP    Component Value Date/Time   PROBNP 100.0 05/21/2020 1054    Imaging: No results found.  Administration History     None          Latest Ref Rng & Units 09/20/2020    9:45 AM 11/21/2018    4:00 PM  PFT Results  FVC-Pre L 2.55  2.53   FVC-Predicted Pre % 69  68   FVC-Post L 2.72  2.68   FVC-Predicted Post % 74  72   Pre FEV1/FVC % % 84  81   Post FEV1/FCV % % 86  82   FEV1-Pre L 2.16  2.05    FEV1-Predicted Pre % 80  74   FEV1-Post L 2.34  2.19   DLCO uncorrected ml/min/mmHg 21.12  15.90   DLCO UNC% % 94  70   DLCO corrected ml/min/mmHg 21.12    DLCO COR %Predicted % 94    DLVA Predicted % 111  97   TLC L 4.37  4.48   TLC % Predicted % 72  74   RV % Predicted % 72  81     No results found for: "NITRICOXIDE"      Assessment & Plan:   Assessment and Plan    Asthmatic bronchitis  -acute exacerbation. He presents with asthmatic bronchitis, experiencing increased coughing, wheezing, and production of tan thick sputum. Breathing has worsened over the last two weeks, though no significant wheezing was noted on exam today. Prednisone  is avoided due to its impact on blood glucose levels. Prescribe azithromycin  (Z-Pak) for bronchitis. Use albuterol  nebulizer once daily for the next three days. Recommend guaifenesin  to help break up mucus. Continue loratadine  for allergies. Order chest x-ray to rule out pneumonia. Continue Trelegy and ensure mouth rinsing after use.  Albuterol  inhaler and nebulizer as needed.  Encouraged him to use his albuterol  nebulizer over the next 2 to 3 days on a consistent basis then as needed.  Sleep apnea   Sleep apnea is managed with CPAP, monitored by [facility].  Atrial fibrillation  -appears under control. Atrial fibrillation is managed with Eliquis  for stroke prevention.  Continue with follow-up with cardiology.  Coronary artery disease, congestive heart failure and angina - He experiences intermittent chest pain and tightness, possibly angina, with coronary artery disease.  Patient is followed closely by cardiology and is on an aggressive maintenance regimen including Imdur .Advised to seek immediate medical attention if symptoms worsen. Call cardiology if chest pain or tightness occurs. Go to the emergency room if experiencing chest pain, tightness, or arm pain.  Diabetes mellitus   Diabetes mellitus-hold on steroids at this time.  Patient  is insulin -dependent follow-up with primary care/endocrinology  Chronic kidney disease.  Continue follow-up primary care/nephrology  Follow-up   Follow up in 2-3 months  to reassess condition and discuss provider options. Check into medication cost assistance and call back if changes are needed. Bring patient assistance form for Trelegy to the office for signature.         Roena Clark, NP 05/10/2023

## 2023-05-11 ENCOUNTER — Encounter (HOSPITAL_BASED_OUTPATIENT_CLINIC_OR_DEPARTMENT_OTHER): Payer: Self-pay

## 2023-05-11 MED ORDER — AMOXICILLIN-POT CLAVULANATE 875-125 MG PO TABS: 1.0000 | ORAL_TABLET | Freq: Two times a day (BID) | ORAL | 0 refills | Status: DC

## 2023-05-11 NOTE — Progress Notes (Signed)
 Pt notified on my chart as no answer on phone

## 2023-05-17 ENCOUNTER — Ambulatory Visit: Payer: Self-pay | Admitting: Cardiology

## 2023-05-17 ENCOUNTER — Ambulatory Visit (HOSPITAL_COMMUNITY)
Admission: RE | Admit: 2023-05-17 | Discharge: 2023-05-17 | Disposition: A | Discharge status: Home / Self Care | Source: Ambulatory Visit | Attending: Cardiology | Admitting: Cardiology

## 2023-05-17 DIAGNOSIS — I5030 Unspecified diastolic (congestive) heart failure: Secondary | ICD-10-CM | POA: Insufficient documentation

## 2023-05-17 DIAGNOSIS — I3139 Other pericardial effusion (noninflammatory): Secondary | ICD-10-CM | POA: Insufficient documentation

## 2023-05-17 LAB — ECHOCARDIOGRAM COMPLETE
AR max vel: 1.96 cm2
AV Area VTI: 2.01 cm2
AV Area mean vel: 2.09 cm2
AV Mean grad: 8 mmHg
AV Peak grad: 16.6 mmHg
Ao pk vel: 2.04 m/s
Area-P 1/2: 2.48 cm2
Calc EF: 62.1 %
S' Lateral: 3 cm
Single Plane A2C EF: 59.5 %
Single Plane A4C EF: 65.3 %

## 2023-05-17 NOTE — Progress Notes (Signed)
*  PRELIMINARY RESULTS* Echocardiogram 2D Echocardiogram has been performed.  Bernis Brisker 05/17/2023, 10:14 AM

## 2023-05-17 NOTE — Progress Notes (Signed)
*  PRELIMINARY RESULTS* Echocardiogram 2D Echocardiogram has been performed.  Paul Baker 05/17/2023, 9:06 AM

## 2023-05-21 ENCOUNTER — Ambulatory Visit: Payer: Self-pay | Admitting: Adult Health

## 2023-05-21 NOTE — Telephone Encounter (Signed)
 Can we make this patient appointment for 1 month follow up from PNA . Make sure he got ABX .

## 2023-05-22 ENCOUNTER — Telehealth: Payer: Self-pay | Admitting: *Deleted

## 2023-05-22 ENCOUNTER — Other Ambulatory Visit: Payer: Self-pay | Admitting: Family Medicine

## 2023-05-22 NOTE — Telephone Encounter (Signed)
 Called and spoke with patient, scheduled to see Roena Clark NP in the [place] office on 06/26/23 at 10:30 am, advised to arrive by 10:15 am for check in.  He verbalized understanding.  Nothing further needed.

## 2023-05-23 ENCOUNTER — Other Ambulatory Visit: Payer: Self-pay

## 2023-05-23 ENCOUNTER — Ambulatory Visit: Attending: Medical | Admitting: Medical

## 2023-05-23 VITALS — BP 118/60 | HR 77 | Ht 64.0 in | Wt 225.0 lb

## 2023-05-23 DIAGNOSIS — I10 Essential (primary) hypertension: Secondary | ICD-10-CM | POA: Diagnosis not present

## 2023-05-23 DIAGNOSIS — R0609 Other forms of dyspnea: Secondary | ICD-10-CM | POA: Diagnosis not present

## 2023-05-23 DIAGNOSIS — N183 Chronic kidney disease, stage 3 unspecified: Secondary | ICD-10-CM

## 2023-05-23 DIAGNOSIS — I5032 Chronic diastolic (congestive) heart failure: Secondary | ICD-10-CM

## 2023-05-23 DIAGNOSIS — I480 Paroxysmal atrial fibrillation: Secondary | ICD-10-CM

## 2023-05-23 DIAGNOSIS — I25119 Atherosclerotic heart disease of native coronary artery with unspecified angina pectoris: Secondary | ICD-10-CM | POA: Diagnosis not present

## 2023-05-23 MED ORDER — ISOSORBIDE MONONITRATE ER 30 MG PO TB24: 30.0000 mg | ORAL_TABLET | Freq: Two times a day (BID) | ORAL | 3 refills | Status: DC

## 2023-05-23 MED ORDER — ISOSORBIDE MONONITRATE ER 30 MG PO TB24: 30.0000 mg | ORAL_TABLET | Freq: Every day | ORAL | 1 refills | Status: DC

## 2023-05-23 NOTE — Patient Instructions (Signed)
 Medication Instructions:   Your physician has recommended you make the following change in your medication:   Increase Imdur  to 30 mg Two Times Daily   *If you need a refill on your cardiac medications before your next appointment, please call your pharmacy*  Lab Work: NONE   If you have labs (blood work) drawn today and your tests are completely normal, you will receive your results only by: MyChart Message (if you have MyChart) OR A paper copy in the mail If you have any lab test that is abnormal or we need to change your treatment, we will call you to review the results.  Testing/Procedures: NONE   Follow-Up: At [facility], you and your health needs are our priority.  As part of our continuing mission to provide you with exceptional heart care, our providers are all part of one team.  This team includes your primary Cardiologist (physician) and Advanced Practice Providers or APPs (Physician Assistants and Nurse Practitioners) who all work together to provide you with the care you need, when you need it.  Your next appointment:   1 month(s)  Provider:   You may see Teddie Favre, MD or one of the following Advanced Practice Providers on your designated Care Team:   Woodfin Hays, PA-C  [place], [place] Theotis Flake, [place]     We recommend signing up for the patient portal called "MyChart".  Sign up information is provided on this After Visit Summary.  MyChart is used to connect with patients for Virtual Visits (Telemedicine).  Patients are able to view lab/test results, encounter notes, upcoming appointments, etc.  Non-urgent messages can be sent to your provider as well.   To learn more about what you can do with MyChart, go to [email or web address].   Other Instructions Thank you for choosing [facility]!

## 2023-05-23 NOTE — Progress Notes (Signed)
 Cardiology Office Note:  .   Date:  05/23/2023  ID:  Paul Baker, DOB 30-Dec-1950, MRN [id number] PCP: Bennet Brasil, MD  [facility] Providers Cardiologist:  Teddie Favre, MD {  History of Present Illness: .   Paul Baker is a 73 y.o. male with a h/o  CAD, HFpEF, Paroxysmal Afib, HTN, CKD stage 2-3, OSA who presents for follow-up for DOE and echo.  Cardiac CTA 04/2022 showed coronary calcium  score of 569, 75th percentile for age, sex, and matched, normal coronary origin with left dominance, moderate stenosis. FFR was negative.  He did not tolerate [place] previously. The patient was last seen 12/2022 and was stable from a cardiac perspective.   Echo 05/2022 for dyspnea (ordered by PCP) showed LVEF 60-65%, no WMA, mild LVH, G1DD.  Today, the patient is feeling a little better. He saw pulmonology who treated him for PNA and is feeling a little better, however still feels SOB on exertion. He has lower leg edema at the end of the day. He takes torsemide  60mg  in the am and 40mg  in the PM. He also has chest tightness at times with exertion. He has OSA and uses a CPAP.    Studies Reviewed: .        Echo 05/2023 1. Left ventricular ejection fraction, by estimation, is 60 to 65%. The  left ventricle has normal function. The left ventricle has no regional  wall motion abnormalities. Left ventricular diastolic parameters are  consistent with Grade I diastolic  dysfunction (impaired relaxation).   2. Right ventricular systolic function is normal. The right ventricular  size is normal.   3. The mitral valve is normal in structure. No evidence of mitral valve  regurgitation. No evidence of mitral stenosis.   4. The aortic valve is tricuspid. Aortic valve regurgitation is trivial.  No aortic stenosis is present.   5. The inferior vena cava is normal in size with greater than 50%  respiratory variability, suggesting right atrial pressure of 3 mmHg.   Echo 05/2022 1. Left  ventricular ejection fraction, by estimation, is 60 to 65%. The  left ventricle has normal function. The left ventricle has no regional  wall motion abnormalities. There is mild left ventricular hypertrophy.  Left ventricular diastolic parameters  are consistent with Grade I diastolic dysfunction (impaired relaxation).  The average left ventricular global longitudinal strain is -20.4 %. The  global longitudinal strain is normal.   2. Right ventricular systolic function is normal. The right ventricular  size is normal. Tricuspid regurgitation signal is inadequate for assessing  PA pressure.   3. Left atrial size was mildly dilated.   4. A small pericardial effusion is present. The pericardial effusion is  posterior to the left ventricle.   5. The mitral valve is grossly normal. No evidence of mitral valve  regurgitation.   6. The aortic valve is tricuspid. Aortic valve regurgitation is trivial.  Aortic valve sclerosis is present, with no evidence of aortic valve  stenosis. Aortic valve mean gradient measures 7.0 mmHg.   7. The inferior vena cava is normal in size with greater than 50%  respiratory variability, suggesting right atrial pressure of 3 mmHg.    Cardiac CTA 04/2022   IMPRESSION: 1. Coronary calcium  score of 569. This was 75th percentile for age, sex, and race matched control.   2. Normal coronary origin with left dominance.   3. CAD-RADS 3. Moderate stenosis. Consider symptom-guided anti-ischemic pharmacotherapy as well as risk factor modification per  guideline directed care. Additional analysis with CT FFR will be submitted. FFR: 1. Left Main: No significant functional stenosis.   2. LAD: No significant functional stenosis, CT-FFR 0.96 at mid LAD lesions. 3. LCX: No significant functional stenosis, CT-FFR 0.96 mid LCX. 4. RCA: No significant functional stenosis, CT-FFR 0.94 proximal-mid RCA, not modeled distally (small vessel) 5. RI: No significant functional  stenosis.   IMPRESSION: 1. CT FFR analysis shows no evidence of significant functional stenosis.    Echo 2021  1. Left ventricular ejection fraction, by estimation, is 55 to 60%. The  left ventricle has normal function. The left ventricle has no regional  wall motion abnormalities. Left ventricular diastolic parameters were  normal.   2. Right ventricular systolic function is normal. The right ventricular  size is normal. Tricuspid regurgitation signal is inadequate for assessing  PA pressure.   3. Left atrial size was upper normal.   4. The mitral valve is grossly normal. Mild mitral valve regurgitation.   5. The aortic valve is tricuspid. Aortic valve regurgitation is not  visualized.   6. The inferior vena cava is normal in size with greater than 50%  respiratory variability, suggesting right atrial pressure of 3 mmHg.   Echo 02/2019  1. Left ventricular ejection fraction, by estimation, is 55 to 60%. The  left ventricle has normal function. The left ventricle has no regional  wall motion abnormalities. Left ventricular diastolic parameters are  consistent with Grade I diastolic  dysfunction (impaired relaxation). Elevated left ventricular end-diastolic  pressure.   2. Right ventricular systolic function is normal. The right ventricular  size is normal.   3. Left atrial size was severely dilated.   4. The mitral valve is grossly normal. Trivial mitral valve  regurgitation.   5. The aortic valve was not well visualized. Aortic valve regurgitation  is not visualized.   6. The inferior vena cava is normal in size with greater than 50%  respiratory variability, suggesting right atrial pressure of 3 mmHg.   Echo 2020 1. The left ventricle has normal systolic function, with an ejection  fraction of 55-60%. The cavity size was normal. There is mildly increased  left ventricular wall thickness. Left ventricular diastolic Doppler  parameters are indeterminate Indeterminent    filling pressures.   2. The right ventricle has normal systolic function. The cavity was  normal. There is no increase in right ventricular wall thickness.   3. Left atrial size was mildly dilated.   4. The mitral valve is normal in structure. No evidence of mitral valve  stenosis.   5. The tricuspid valve is normal in structure.   6. The aortic valve is tricuspid Mild thickening of the aortic valve no  stenosis of the aortic valve.   7. The aortic root is normal in size and structure.   8. Pulmonary hypertension is indeterminant, inadequate TR jet.   9. The inferior vena cava was dilated in size with >50% respiratory  variability.   LHC 01/2018 Nonobstructive coronary artery disease with 60 to 70% proximal to mid nondominant right coronary, 50% mid LAD, and 40 to 50% first obtuse marginal narrowing. Normal left ventricular systolic function with EF 60%. LVEDP is 17 mmHg. Failed right heart catheterization from right antecubital vein due to distal subclavian stenosis likely from prior PICC line.  Further attempts a right heart cath were aborted as IV heparin  had been given for left heart catheterization.   RECOMMENDATIONS:   INOCA (ischemia with no obstructive coronary artery disease).  Aggressive risk factor modification including excellent lipid control. 2D Doppler echocardiogram to assess RV and estimate PA pressure.  Right heart cath from femoral approach her left arm could be attempted in the future if that information is absolutely needed. He is not compliant with CPAP which may be the driver for his recent decrease in exertional tolerance and chest pain.     Physical Exam:   VS:  BP 118/60 (BP Location: Left Arm, Patient Position: Sitting, Cuff Size: Normal)   Pulse 77   Ht 5\' 4"  (1.626 m)   Wt 225 lb (102.1 kg)   SpO2 92%   BMI 38.62 kg/m    Wt Readings from Last 3 Encounters:  05/23/23 225 lb (102.1 kg)  05/10/23 228 lb 6.4 oz (103.6 kg)  04/23/23 224 lb 3.2 oz (101.7 kg)     GEN: Well nourished, well developed in no acute distress NECK: No JVD; No carotid bruits CARDIAC: RRR, no murmurs, rubs, gallops RESPIRATORY:  Clear to auscultation without rales, wheezing or rhonchi  ABDOMEN: Soft, non-tender, non-distended EXTREMITIES:  No edema; No deformity   ASSESSMENT AND PLAN: .    DOE CAD The patient reports improved but persistent Sob on exertion. He was treated for PNA which has improved breathing. He still gets SOB however and has occasional chest tightness along with this. Cath in 2020 showed 60-70% disease to nondominant RCA, 50% mLAD and 40-50% 1st OM. Medical management was recommended. Cardiac CTA in 2024 showed moderate stenosis with negative FFR. DOE may be angina equivalent, however suspect lung issues (asthma and OSA), deconditioning and weight are also contributing. Recent echo showed normal LVEF with G1DD. Patient is reluctant to pursue cath at this time. I will increase Imdur  to 30mg  BID. We will see him back in 1 month and if symptoms are persistent, will consider R/L heart cath. No ASA given Eliquis . Conitnue zetia , Toprol , statin and SL NTG.  HFpEF Recent echo showed normal LVEF with G1DD. The patient is euvolemic on exam. He takes torsemide  60mg  in the AM and 40mg  in the PM. He reports occasional lower leg edema at night. Recent BNP normal. He did not tolerate Farx[place] in the past. Continue Losartan  and Toprol .   Paroxysmal Afib Continue Eliquis  for stroke ppx. He is in NSR on exam. Continue toprol  for rate control.   HTN BP is normal, continue current medications.   CKD stage 3 Scr baseline around 1.4       Dispo: Follow-up in 1 month  Signed, Jevaeh Shams Rebekah [place], PA-C

## 2023-05-24 ENCOUNTER — Inpatient Hospital Stay: Payer: Medicare HMO | Attending: Hematology

## 2023-05-24 DIAGNOSIS — Z86711 Personal history of pulmonary embolism: Secondary | ICD-10-CM | POA: Insufficient documentation

## 2023-05-24 DIAGNOSIS — J45909 Unspecified asthma, uncomplicated: Secondary | ICD-10-CM | POA: Insufficient documentation

## 2023-05-24 DIAGNOSIS — R202 Paresthesia of skin: Secondary | ICD-10-CM | POA: Insufficient documentation

## 2023-05-24 DIAGNOSIS — K5900 Constipation, unspecified: Secondary | ICD-10-CM | POA: Diagnosis not present

## 2023-05-24 DIAGNOSIS — E782 Mixed hyperlipidemia: Secondary | ICD-10-CM | POA: Diagnosis not present

## 2023-05-24 DIAGNOSIS — Z800 Family history of malignant neoplasm of digestive organs: Secondary | ICD-10-CM | POA: Insufficient documentation

## 2023-05-24 DIAGNOSIS — Z86718 Personal history of other venous thrombosis and embolism: Secondary | ICD-10-CM | POA: Insufficient documentation

## 2023-05-24 DIAGNOSIS — E039 Hypothyroidism, unspecified: Secondary | ICD-10-CM | POA: Diagnosis not present

## 2023-05-24 DIAGNOSIS — Z7985 Long-term (current) use of injectable non-insulin antidiabetic drugs: Secondary | ICD-10-CM | POA: Insufficient documentation

## 2023-05-24 DIAGNOSIS — R079 Chest pain, unspecified: Secondary | ICD-10-CM | POA: Insufficient documentation

## 2023-05-24 DIAGNOSIS — Z820 Family history of epilepsy and other diseases of the nervous system: Secondary | ICD-10-CM | POA: Insufficient documentation

## 2023-05-24 DIAGNOSIS — R0602 Shortness of breath: Secondary | ICD-10-CM | POA: Insufficient documentation

## 2023-05-24 DIAGNOSIS — Z888 Allergy status to other drugs, medicaments and biological substances status: Secondary | ICD-10-CM | POA: Insufficient documentation

## 2023-05-24 DIAGNOSIS — Z9089 Acquired absence of other organs: Secondary | ICD-10-CM | POA: Insufficient documentation

## 2023-05-24 DIAGNOSIS — Z8249 Family history of ischemic heart disease and other diseases of the circulatory system: Secondary | ICD-10-CM | POA: Insufficient documentation

## 2023-05-24 DIAGNOSIS — R519 Headache, unspecified: Secondary | ICD-10-CM | POA: Insufficient documentation

## 2023-05-24 DIAGNOSIS — R200 Anesthesia of skin: Secondary | ICD-10-CM | POA: Diagnosis not present

## 2023-05-24 DIAGNOSIS — R002 Palpitations: Secondary | ICD-10-CM | POA: Diagnosis not present

## 2023-05-24 DIAGNOSIS — R059 Cough, unspecified: Secondary | ICD-10-CM | POA: Insufficient documentation

## 2023-05-24 DIAGNOSIS — I509 Heart failure, unspecified: Secondary | ICD-10-CM | POA: Diagnosis not present

## 2023-05-24 DIAGNOSIS — I2510 Atherosclerotic heart disease of native coronary artery without angina pectoris: Secondary | ICD-10-CM | POA: Insufficient documentation

## 2023-05-24 DIAGNOSIS — D509 Iron deficiency anemia, unspecified: Secondary | ICD-10-CM | POA: Diagnosis not present

## 2023-05-24 DIAGNOSIS — Z860100 Personal history of colon polyps, unspecified: Secondary | ICD-10-CM | POA: Diagnosis not present

## 2023-05-24 DIAGNOSIS — Z79899 Other long term (current) drug therapy: Secondary | ICD-10-CM | POA: Insufficient documentation

## 2023-05-24 DIAGNOSIS — Z833 Family history of diabetes mellitus: Secondary | ICD-10-CM | POA: Insufficient documentation

## 2023-05-24 DIAGNOSIS — G4733 Obstructive sleep apnea (adult) (pediatric): Secondary | ICD-10-CM | POA: Diagnosis not present

## 2023-05-24 DIAGNOSIS — Z885 Allergy status to narcotic agent status: Secondary | ICD-10-CM | POA: Insufficient documentation

## 2023-05-24 DIAGNOSIS — R197 Diarrhea, unspecified: Secondary | ICD-10-CM | POA: Insufficient documentation

## 2023-05-24 DIAGNOSIS — E119 Type 2 diabetes mellitus without complications: Secondary | ICD-10-CM | POA: Insufficient documentation

## 2023-05-24 DIAGNOSIS — I4891 Unspecified atrial fibrillation: Secondary | ICD-10-CM | POA: Insufficient documentation

## 2023-05-24 DIAGNOSIS — Z87891 Personal history of nicotine dependence: Secondary | ICD-10-CM | POA: Insufficient documentation

## 2023-05-24 DIAGNOSIS — R110 Nausea: Secondary | ICD-10-CM | POA: Insufficient documentation

## 2023-05-24 DIAGNOSIS — R768 Other specified abnormal immunological findings in serum: Secondary | ICD-10-CM | POA: Insufficient documentation

## 2023-05-24 DIAGNOSIS — Z8051 Family history of malignant neoplasm of kidney: Secondary | ICD-10-CM | POA: Insufficient documentation

## 2023-05-24 DIAGNOSIS — D649 Anemia, unspecified: Secondary | ICD-10-CM

## 2023-05-24 DIAGNOSIS — Z85828 Personal history of other malignant neoplasm of skin: Secondary | ICD-10-CM | POA: Insufficient documentation

## 2023-05-24 DIAGNOSIS — Z841 Family history of disorders of kidney and ureter: Secondary | ICD-10-CM | POA: Insufficient documentation

## 2023-05-24 LAB — COMPREHENSIVE METABOLIC PANEL WITH GFR
ALT: 18 U/L (ref 0–44)
AST: 17 U/L (ref 15–41)
Albumin: 3.6 g/dL (ref 3.5–5.0)
Alkaline Phosphatase: 64 U/L (ref 38–126)
Anion gap: 10 (ref 5–15)
BUN: 50 mg/dL — ABNORMAL HIGH (ref 8–23)
CO2: 24 mmol/L (ref 22–32)
Calcium: 8.8 mg/dL — ABNORMAL LOW (ref 8.9–10.3)
Chloride: 99 mmol/L (ref 98–111)
Creatinine, Ser: 1.29 mg/dL — ABNORMAL HIGH (ref 0.61–1.24)
GFR, Estimated: 59 mL/min — ABNORMAL LOW (ref >60)
Glucose, Bld: 195 mg/dL — ABNORMAL HIGH (ref 70–99)
Potassium: 4.4 mmol/L (ref 3.5–5.1)
Sodium: 133 mmol/L — ABNORMAL LOW (ref 135–145)
Total Bilirubin: 0.6 mg/dL (ref 0.0–1.2)
Total Protein: 7 g/dL (ref 6.5–8.1)

## 2023-05-24 LAB — CBC WITH DIFFERENTIAL/PLATELET
Abs Immature Granulocytes: 0.08 10*3/uL — ABNORMAL HIGH (ref 0.00–0.07)
Basophils Absolute: 0.1 10*3/uL (ref 0.0–0.1)
Basophils Relative: 1 %
Eosinophils Absolute: 0.4 10*3/uL (ref 0.0–0.5)
Eosinophils Relative: 4 %
HCT: 34.3 % — ABNORMAL LOW (ref 39.0–52.0)
Hemoglobin: 10.8 g/dL — ABNORMAL LOW (ref 13.0–17.0)
Immature Granulocytes: 1 %
Lymphocytes Relative: 15 %
Lymphs Abs: 1.5 10*3/uL (ref 0.7–4.0)
MCH: 29.3 pg (ref 26.0–34.0)
MCHC: 31.5 g/dL (ref 30.0–36.0)
MCV: 93.2 fL (ref 80.0–100.0)
Monocytes Absolute: 1.3 10*3/uL — ABNORMAL HIGH (ref 0.1–1.0)
Monocytes Relative: 13 %
Neutro Abs: 6.6 10*3/uL (ref 1.7–7.7)
Neutrophils Relative %: 66 %
Platelets: 208 10*3/uL (ref 150–400)
RBC: 3.68 MIL/uL — ABNORMAL LOW (ref 4.22–5.81)
RDW: 14.6 % (ref 11.5–15.5)
WBC: 9.8 10*3/uL (ref 4.0–10.5)
nRBC: 0 % (ref 0.0–0.2)

## 2023-05-24 LAB — FERRITIN: Ferritin: 172 ng/mL (ref 24–336)

## 2023-05-31 ENCOUNTER — Inpatient Hospital Stay: Payer: Medicare HMO | Admitting: Oncology

## 2023-05-31 VITALS — BP 122/57 | HR 79 | Temp 97.9°F | Resp 19 | Ht 64.0 in | Wt 228.8 lb

## 2023-05-31 DIAGNOSIS — R768 Other specified abnormal immunological findings in serum: Secondary | ICD-10-CM

## 2023-05-31 DIAGNOSIS — R197 Diarrhea, unspecified: Secondary | ICD-10-CM | POA: Diagnosis not present

## 2023-05-31 DIAGNOSIS — Z7985 Long-term (current) use of injectable non-insulin antidiabetic drugs: Secondary | ICD-10-CM | POA: Diagnosis not present

## 2023-05-31 DIAGNOSIS — R002 Palpitations: Secondary | ICD-10-CM | POA: Diagnosis not present

## 2023-05-31 DIAGNOSIS — R0602 Shortness of breath: Secondary | ICD-10-CM | POA: Diagnosis not present

## 2023-05-31 DIAGNOSIS — R079 Chest pain, unspecified: Secondary | ICD-10-CM | POA: Diagnosis not present

## 2023-05-31 DIAGNOSIS — I2510 Atherosclerotic heart disease of native coronary artery without angina pectoris: Secondary | ICD-10-CM | POA: Diagnosis not present

## 2023-05-31 DIAGNOSIS — D649 Anemia, unspecified: Secondary | ICD-10-CM | POA: Diagnosis not present

## 2023-05-31 DIAGNOSIS — E782 Mixed hyperlipidemia: Secondary | ICD-10-CM | POA: Diagnosis not present

## 2023-05-31 DIAGNOSIS — R200 Anesthesia of skin: Secondary | ICD-10-CM | POA: Diagnosis not present

## 2023-05-31 DIAGNOSIS — Z860100 Personal history of colon polyps, unspecified: Secondary | ICD-10-CM | POA: Diagnosis not present

## 2023-05-31 DIAGNOSIS — G4733 Obstructive sleep apnea (adult) (pediatric): Secondary | ICD-10-CM | POA: Diagnosis not present

## 2023-05-31 DIAGNOSIS — I509 Heart failure, unspecified: Secondary | ICD-10-CM | POA: Diagnosis not present

## 2023-05-31 DIAGNOSIS — Z79899 Other long term (current) drug therapy: Secondary | ICD-10-CM | POA: Diagnosis not present

## 2023-05-31 DIAGNOSIS — K5900 Constipation, unspecified: Secondary | ICD-10-CM | POA: Diagnosis not present

## 2023-05-31 DIAGNOSIS — J45909 Unspecified asthma, uncomplicated: Secondary | ICD-10-CM | POA: Diagnosis not present

## 2023-05-31 DIAGNOSIS — R202 Paresthesia of skin: Secondary | ICD-10-CM | POA: Diagnosis not present

## 2023-05-31 DIAGNOSIS — Z85828 Personal history of other malignant neoplasm of skin: Secondary | ICD-10-CM | POA: Diagnosis not present

## 2023-05-31 DIAGNOSIS — D509 Iron deficiency anemia, unspecified: Secondary | ICD-10-CM | POA: Diagnosis not present

## 2023-05-31 DIAGNOSIS — R110 Nausea: Secondary | ICD-10-CM | POA: Diagnosis not present

## 2023-05-31 DIAGNOSIS — E119 Type 2 diabetes mellitus without complications: Secondary | ICD-10-CM | POA: Diagnosis not present

## 2023-05-31 DIAGNOSIS — E039 Hypothyroidism, unspecified: Secondary | ICD-10-CM | POA: Diagnosis not present

## 2023-05-31 DIAGNOSIS — R059 Cough, unspecified: Secondary | ICD-10-CM | POA: Diagnosis not present

## 2023-05-31 DIAGNOSIS — R519 Headache, unspecified: Secondary | ICD-10-CM | POA: Diagnosis not present

## 2023-05-31 DIAGNOSIS — I4891 Unspecified atrial fibrillation: Secondary | ICD-10-CM | POA: Diagnosis not present

## 2023-05-31 NOTE — Progress Notes (Signed)
 [facility] [address] [place] [address]   Clinic Day:  05/31/2023  Referring physician: Bennet Brasil, MD  Patient Care Team: Bennet Brasil, MD as PCP - General (Family Medicine) Gerard Knight, MD as PCP - Cardiology (Cardiology) Dohmeier, Raoul Byes, MD as Consulting Physician (Neurology) [facility]   Paulett Boros, MD as Consulting Physician (Hematology) Wendel Hals, NP as Nurse Practitioner (Nurse Practitioner)   CHIEF COMPLAINT/PURPOSE OF CONSULT:   Diagnosis: elevated sFLC, normocytic anemia  HISTORY OF PRESENT ILLNESS:   Paul Baker is a 73 y.o. male returns for a follow up for normocytic anemia and elevated serum free light chains He is alone today.He was last seen in clinic on 02/01/2023 by me.  He is followed by endocrinology for type 2 diabetes and was last evaluated on 04/09/2023.  Unfortunately, diabetes is not controlled.  He is also on several injectables.  Last hemoglobin A1c was 8.5 which is up from previous 7.8.  Patient is followed closely by cardiology as well for CAD, A-fib, hypertension and CKD stage II-III.  He recently had an echocardiogram on 05/04/2022 for dyspnea which showed an EF of 60 to 65%.   He was recently treated for pneumonia pulmonology (05/10/23) and feels like his shortness of breath and cough are slowly starting to improve.  He stays active and walks with his wife.  He uses inhaled corticosteroids as needed.  Reports energy levels of 25% and appetite is he has chest pain relation with palpitations.  Has constipation, diarrhea and nausea due to Ozempic .  Has occasional dizziness, headaches and numbness and tingling in his hand. Has trouble falling and staying asleep.  He has been taking OTC iron  tablets daily along with B12 supplements.  He has not noticed any bleeding.   PAST MEDICAL HISTORY:   Past Medical History: Past Medical History:  Diagnosis Date   Arthritis    Asthma     Atrial fibrillation [facility])    Diagnosed December 2021   Colon polyp    Coronary atherosclerosis of native coronary artery    a. 2011: cath showing 90% stenosis along small non-dominant RCA (too small for PCI). b. 01/2018: cath showing nonobstructive CAD with 60 to 70% proximal to mid nondominant RCA stenosis, 50% mid LAD and 40 to 50% OM1   DVT (deep venous thrombosis) (HCC) 2005   Right arm   Essential hypertension    Headache    History of transfusion    Hypothyroidism    Mixed hyperlipidemia    Morbid obesity (HCC)    MRSA (methicillin resistant staph aureus) culture positive    08/2012   Narcolepsy    OSA (obstructive sleep apnea)    CPAP   Osteoarthritis    Pneumonia    Psoriasis    Pulmonary embolism (HCC) 2004   RLS (restless legs syndrome)    Rotator cuff disorder    Left   Septic arthritis of knee, left (HCC)    Skin cancer, basal cell    Type 2 diabetes mellitus (HCC)     Surgical History: Past Surgical History:  Procedure Laterality Date   BACK SURGERY     BIOPSY  01/31/2022   Procedure: BIOPSY;  Surgeon: Renaye Carp, DO;  Location: [facility];  Service: Gastroenterology;;   CATARACT EXTRACTION Bilateral    COLONOSCOPY     COLONOSCOPY WITH PROPOFOL  N/A 01/31/2022   Procedure: COLONOSCOPY WITH PROPOFOL ;  Surgeon: Renaye Carp, DO;  Location: [facility];  Service: Gastroenterology;  Laterality: N/A;   CYST REMOVAL TRUNK     from back   ESOPHAGOGASTRODUODENOSCOPY (EGD) WITH PROPOFOL  N/A 01/31/2022   Procedure: ESOPHAGOGASTRODUODENOSCOPY (EGD) WITH PROPOFOL ;  Surgeon: Renaye Carp, DO;  Location: [facility];  Service: Gastroenterology;  Laterality: N/A;   EYE SURGERY Left 2016   laser to left eye   HIP SURGERY     bone removed from both sides of hip   KNEE ARTHROTOMY Right 12/04/2014   Procedure: KNEE ARTHROTOMY PATELLA LIGAMENT RECONSTRUSION AND REPAIR RIGHT KNEE;  Surgeon: Claiborne Crew, MD;  Location: [facility] OR;  Service: Orthopedics;   Laterality: Right;   KNEE SURGERY     X 25 TIMES   LEFT HEART CATH AND CORONARY ANGIOGRAPHY N/A 01/17/2018   Procedure: LEFT HEART CATH AND CORONARY ANGIOGRAPHY;  Surgeon: Arty Binning, MD;  Location: [facility] INVASIVE CV LAB;  Service: Cardiovascular;  Laterality: N/A;   LUMBAR DISC SURGERY     Left L3, L4, L5 discecotomy with decompression of L4 root   POLYPECTOMY  01/31/2022   Procedure: POLYPECTOMY;  Surgeon: Renaye Carp, DO;  Location: [facility];  Service: Gastroenterology;;   TONSILLECTOMY     TOTAL KNEE ARTHROPLASTY  2003   LEFT   TOTAL KNEE ARTHROPLASTY Right 03/23/2014   Procedure: RIGHT TOTAL KNEE ARTHROPLASTY AND REMOVAL RIGHT TIBIAL  DEEP IMPLANT STAPLE;  Surgeon: Claiborne Crew, MD;  Location: [facility] OR[facility];  Service: Orthopedics;  Laterality: Right;   TOTAL KNEE REVISION  2005   LEFT   WRIST SURGERY      Social History: Social History   Socioeconomic History   Marital status: Married    Spouse name: Engineer, materials   Number of children: 2   Years of education: college   Highest education level: Not on file  Occupational History   Occupation: Disabled    Employer: UNEMPLOYED  Tobacco Use   Smoking status: Former    Current packs/day: 0.00    Average packs/day: 1.5 packs/day for 27.5 years (41.3 ttl pk-yrs)    Types: Cigarettes    Start date: 06/19/1967    Quit date: 01/03/1995    Years since quitting: 28.4    Passive exposure: Never   Smokeless tobacco: Never  Vaping Use   Vaping status: Never Used  Substance and Sexual Activity   Alcohol  use: No    Alcohol /week: 0.0 standard drinks of alcohol     Comment: quit drinking in 07/86   Drug use: No   Sexual activity: Yes    Partners: Female  Other Topics Concern   Not on file  Social History Narrative    73 year old, right-handed, caucasian male with a past medical history of obesity, hypertension, hyperlipidemia, diabetes, obstructive sleep apnea, presenting with frequent nighttime awakenings, excessive daytime sleepiness,  also transient confusional episodes.RLS and one beosity, OSA on CPAP with AHI of 3.2 and  setting of 16 cm water , [facility] .   Married since 1977.   Social Drivers of Corporate investment banker Strain: Low Risk  (05/05/2022)   Overall Financial Resource Strain (CARDIA)    Difficulty of Paying Living Expenses: Not hard at all  Food Insecurity: Patient Declined (07/12/2022)   Hunger Vital Sign    Worried About Running Out of Food in the Last Year: Patient declined    Ran Out of Food in the Last Year: Patient declined  Transportation Needs: No Transportation Needs (07/17/2022)   PRAPARE - Administrator, Civil Service (Medical): No  Lack of Transportation (Non-Medical): No  Physical Activity: Insufficiently Active (05/05/2022)   Exercise Vital Sign    Days of Exercise per Week: 3 days    Minutes of Exercise per Session: 30 min  Stress: No Stress Concern Present (05/05/2022)   [employer] of Occupational Health - Occupational Stress Questionnaire    Feeling of Stress : Not at all  Social Connections: Socially Integrated (05/05/2022)   Social Connection and Isolation Panel [NHANES]    Frequency of Communication with Friends and Family: More than three times a week    Frequency of Social Gatherings with Friends and Family: More than three times a week    Attends Religious Services: More than 4 times per year    Active Member of [employer] or Organizations: Yes    Attends Engineer, structural: More than 4 times per year    Marital Status: Married  Catering manager Violence: Not At Risk (05/05/2022)   Humiliation, Afraid, Rape, and Kick questionnaire    Fear of Current or Ex-Partner: No    Emotionally Abused: No    Physically Abused: No    Sexually Abused: No    Family History: Family History  Problem Relation Age of Onset   Hypertension Father    Heart attack Father    Kidney Stones Father    Seizures Grandchild    Narcolepsy Grandchild    Diabetes Sister      Current Medications:  Current Outpatient Medications:    acetaminophen  (TYLENOL ) 325 MG tablet, Take 2 tablets (650 mg total) by mouth every 6 (six) hours as needed for mild pain (or Fever >/= 101). (Patient taking differently: Take 1,000 mg by mouth every 6 (six) hours as needed for mild pain (pain score 1-3) (or Fever >/= 101).), Disp: 30 tablet, Rfl: 1   albuterol  (PROVENTIL ) (2.5 MG/3ML) 0.083% nebulizer solution, Take 3 mLs (2.5 mg total) by nebulization every 6 (six) hours as needed for up to 7 days for wheezing or shortness of breath., Disp: 84 mL, Rfl: 3   albuterol  (VENTOLIN  HFA) 108 (90 Base) MCG/ACT inhaler, Inhale 2 puffs into the lungs every 6 (six) hours as needed for wheezing or shortness of breath., Disp: 18 g, Rfl: 5   alfuzosin  (UROXATRAL ) 10 MG 24 hr tablet, Take 10 mg by mouth daily., Disp: , Rfl:    Alpha Lipoic Acid -Biotin  ER 600-450 MG-MCG TBCR, Take 600 mg by mouth every morning. (Patient taking differently: Take 600 mg by mouth at bedtime.), Disp: , Rfl:    amoxicillin -clavulanate (AUGMENTIN ) 875-125 MG tablet, Take 1 tablet by mouth 2 (two) times daily., Disp: 14 tablet, Rfl: 0   apixaban  (ELIQUIS ) 5 MG TABS tablet, Take 1 tablet (5 mg total) by mouth 2 (two) times daily. For stroke prevention, Disp: 60 tablet, Rfl: 5   clobetasol  cream (TEMOVATE ) 0.05 %, Apply thin amount 2 times daily prn to psoriasis not for face axilla or groin, Disp: 45 g, Rfl: 1   Continuous Glucose Sensor (DEXCOM G7 SENSOR) MISC, Change sensor every 10 days, Disp: 3 each, Rfl: 3   Cyanocobalamin  (VITAMIN B 12 PO), Take by mouth., Disp: , Rfl:    desonide  (DESOWEN ) 0.05 % cream, Apply once daily prn to facial rash, Disp: 30 g, Rfl: 0   dextromethorphan -guaiFENesin  (MUCINEX  DM) 30-600 MG 12hr tablet, Take 1 tablet by mouth 2 (two) times daily. (Patient taking differently: Take 1 tablet by mouth as needed for cough.), Disp: 20 tablet, Rfl: 0   ezetimibe  (ZETIA ) 10 MG tablet,  Take 10 mg by mouth  daily., Disp: , Rfl:    FIASP  FLEXTOUCH 100 UNIT/ML FlexTouch Pen, Inject 30-36 Units into the skin in the morning, at noon, and at bedtime., Disp: , Rfl:    fluticasone  (FLONASE ) 50 MCG/ACT nasal spray, Place 1 spray into both nostrils daily. (Patient taking differently: Place 1 spray into both nostrils daily as needed.), Disp: 16 g, Rfl: 2   Fluticasone -Umeclidin-Vilant (TRELEGY ELLIPTA ) 100-62.5-25 MCG/ACT AEPB, Inhale 1 puff into the lungs daily., Disp: 60 each, Rfl: 0   Fluticasone -Umeclidin-Vilant (TRELEGY ELLIPTA ) 100-62.5-25 MCG/ACT AEPB, Inhale 1 puff into the lungs daily., Disp: 60 each, Rfl: 5   Glucagon  (GVOKE HYPOPEN  2-PACK) 1 MG/0.2ML SOAJ, Inject 1 mg subcutaneously once as needed for low blood sugar. May repeat dose in 15 minutes as needed using a new device. (Patient taking differently: 2 mg. Inject 2 mg subcutaneously once as needed for low blood sugar. May repeat dose in 15 minutes as needed using a new device.), Disp: 0.4 mL, Rfl: 1   insulin  degludec (TRESIBA  FLEXTOUCH) 200 UNIT/ML FlexTouch Pen, Inject 120 Units into the skin at bedtime., Disp: , Rfl:    Insulin  Pen Needle (B-D ULTRAFINE III SHORT PEN) 31G X 8 MM MISC, USE 1 PEN NEEDLE THREE TIMES DAILY, Disp: 100 each, Rfl: 2   isosorbide  mononitrate (IMDUR ) 30 MG 24 hr tablet, Take 1 tablet (30 mg total) by mouth 2 (two) times daily., Disp: 180 tablet, Rfl: 3   levothyroxine  (SYNTHROID ) 137 MCG tablet, Take 1 tablet (137 mcg total) by mouth daily before breakfast., Disp: 90 tablet, Rfl: 3   loratadine  (CLARITIN ) 10 MG tablet, Take 10 mg by mouth daily as needed for allergies. , Disp: , Rfl:    losartan  (COZAAR ) 50 MG tablet, Take 50 mg by mouth daily., Disp: , Rfl:    magnesium  30 MG tablet, Take by mouth daily., Disp: , Rfl:    metoprolol  succinate (TOPROL  XL) 25 MG 24 hr tablet, Take 1 tablet (25 mg total) by mouth 2 (two) times daily., Disp: 270 tablet, Rfl: 1   metoprolol  tartrate (LOPRESSOR ) 25 MG tablet, Take 25 mg by  mouth as needed (palpitations)., Disp: , Rfl:    modafinil  (PROVIGIL ) 200 MG tablet, Take 1 tablet (200 mg total) by mouth daily., Disp: 30 tablet, Rfl: 5   nitroGLYCERIN  (NITROSTAT ) 0.4 MG SL tablet, DISSOLVE 1 TABLET UNDER TONGUE EVERY 5 MINUTES UP TO 15 MIN FOR CHESTPAIN. IF NO RELIEF CALL 911., Disp: 25 tablet, Rfl: 3   ONE TOUCH ULTRA TEST test strip, TEST BLOOD SUGAR UP TO 4 TIMES DAILY., Disp: 150 each, Rfl: 5   ONETOUCH DELICA LANCETS 33G MISC, USE AS DIRECTED TO TEST BLOOD SUGAR 4 TIMES DAILY., Disp: 150 each, Rfl: 5   pravastatin  (PRAVACHOL ) 40 MG tablet, TAKE 1 TABLET BY MOUTH ONCE DAILY IN THE EVENING, Disp: 90 tablet, Rfl: 2   rOPINIRole  (REQUIP  XL) 4 MG 24 hr tablet, TAKE 1 TABLET BY MOUTH AT BEDTIME, Disp: 90 tablet, Rfl: 1   rOPINIRole  (REQUIP ) 4 MG tablet, Take 1/2 (one-half) tablet by mouth twice daily, Disp: 90 tablet, Rfl: 0   Semaglutide , 1 MG/DOSE, 4 MG/3ML SOPN, Inject 1 mg into the skin once a week., Disp: 9 mL, Rfl: 1   torsemide  (DEMADEX ) 20 MG tablet, Take 3 tablets (60 mg total) by mouth every morning. & 40 mg in the evening, Disp: 450 tablet, Rfl: 1   Allergies: Allergies  Allergen Reactions   Tape Rash    Pulls  off skin   Farxiga [Dapagliflozin]     Yeast infections, felt sick with it   Cardizem  [Diltiazem  Hcl]     Edema    Cardura [Doxazosin Mesylate]     Headaches / cramps   Crestor  [Rosuvastatin ] Other (See Comments)    Leg cramps   Lipitor [Atorvastatin ] Other (See Comments)    Leg cramps   Paxil [Paroxetine Hcl]     Unknown reaction    Codeine Rash and Other (See Comments)    Headache    Gabapentin Rash    REVIEW OF SYSTEMS:   Review of Systems  Constitutional:  Positive for fatigue.  Respiratory:  Positive for cough and shortness of breath.   Cardiovascular:  Positive for chest pain and palpitations.  Gastrointestinal:  Positive for constipation, diarrhea and nausea.  Neurological:  Positive for dizziness, headaches and numbness.   Psychiatric/Behavioral:  Positive for sleep disturbance.      VITALS:   Blood pressure (!) 122/57, pulse 79, temperature 97.9 F (36.6 C), temperature source Oral, resp. rate 19, height 5\' 4"  (1.626 m), weight 228 lb 12.8 oz (103.8 kg), SpO2 98%.  Wt Readings from Last 3 Encounters:  05/31/23 228 lb 12.8 oz (103.8 kg)  05/23/23 225 lb (102.1 kg)  05/10/23 228 lb 6.4 oz (103.6 kg)    Body mass index is 39.27 kg/m.   PHYSICAL EXAM:   Physical Exam Constitutional:      Appearance: Normal appearance.  Cardiovascular:     Rate and Rhythm: Normal rate and regular rhythm.  Pulmonary:     Effort: Pulmonary effort is normal.     Breath sounds: Normal breath sounds.  Abdominal:     General: Bowel sounds are normal.     Palpations: Abdomen is soft.  Musculoskeletal:        General: No swelling. Normal range of motion.  Neurological:     Mental Status: He is alert and oriented to person, place, and time. Mental status is at baseline.     LABS:      Latest Ref Rng & Units 05/24/2023    8:57 AM 01/25/2023    9:39 AM 10/05/2022    4:32 PM  CBC  WBC 4.0 - 10.5 K/uL 9.8  8.9  10.0   Hemoglobin 13.0 - 17.0 g/dL 16.1  09.6  04.5   Hematocrit 39.0 - 52.0 % 34.3  34.4  35.8   Platelets 150 - 400 K/uL 208  229  308       Latest Ref Rng & Units 05/24/2023    8:57 AM 01/25/2023    9:39 AM 01/01/2023   11:59 AM  CMP  Glucose 70 - 99 mg/dL 409  811  914   BUN 8 - 23 mg/dL 50  45  35   Creatinine 0.61 - 1.24 mg/dL 7.82  9.56  2.13   Sodium 135 - 145 mmol/L 133  138  141   Potassium 3.5 - 5.1 mmol/L 4.4  4.7  4.7   Chloride 98 - 111 mmol/L 99  103  103   CO2 22 - 32 mmol/L 24  25  21    Calcium  8.9 - 10.3 mg/dL 8.8  9.0  9.3   Total Protein 6.5 - 8.1 g/dL 7.0  7.4    Total Bilirubin 0.0 - 1.2 mg/dL 0.6  0.4    Alkaline Phos 38 - 126 U/L 64  58    AST 15 - 41 U/L 17  21    ALT 0 -  44 U/L 18  19       No results found for: "CEA1", "CEA" / No results found for: "CEA1", "CEA" Lab  Results  Component Value Date   PSA1 4.5 (H) 04/26/2023   No results found for: "ZOX096" No results found for: "CAN125"  Lab Results  Component Value Date   ALBUMINELP 3.6 02/28/2022   MSPIKE Not Observed 02/28/2022   Lab Results  Component Value Date   TIBC 318 01/25/2023   TIBC 317 07/26/2022   TIBC 355 04/21/2022   FERRITIN 172 05/24/2023   FERRITIN 112 01/25/2023   FERRITIN 104 07/26/2022   IRONPCTSAT 17 (L) 01/25/2023   IRONPCTSAT 25 07/26/2022   IRONPCTSAT 15 (L) 04/21/2022   Lab Results  Component Value Date   LDH 181 04/21/2022     STUDIES:   ECHOCARDIOGRAM COMPLETE Result Date: 05/17/2023    ECHOCARDIOGRAM REPORT   Patient Name:   Paul Baker Date of Exam: 05/17/2023 Medical Rec #:  [id number]          Height:       64.0 in Accession #:    [id number]         Weight:       228.4 lb Date of Birth:  Apr 17, 1950          BSA:          2.070 m Patient Age:    72 years           BP:           107/63 mmHg Patient Gender: M                  HR:           84 bpm. Exam Location:  [facility] Procedure: 2D Echo, Cardiac Doppler and Color Doppler (Both Spectral and Color            Flow Doppler were utilized during procedure). Indications:    (HFpEF) heart failure with preserved ejection fraction (HCC)                 [[id number]]                 Pericardial effusion [[id number]]  History:        Patient has prior history of Echocardiogram examinations, most                 recent 05/03/2022. CHF, CAD, Arrythmias:Atrial Fibrillation; Risk                 Factors:Hypertension, Diabetes, Dyslipidemia and Former Smoker.  Sonographer:    Denese Finn RCS Referring Phys: [phone] SAMUEL G MCDOWELL IMPRESSIONS  1. Left ventricular ejection fraction, by estimation, is 60 to 65%. The left ventricle has normal function. The left ventricle has no regional wall motion abnormalities. Left ventricular diastolic parameters are consistent with Grade I diastolic dysfunction (impaired relaxation).  2. Right  ventricular systolic function is normal. The right ventricular size is normal.  3. The mitral valve is normal in structure. No evidence of mitral valve regurgitation. No evidence of mitral stenosis.  4. The aortic valve is tricuspid. Aortic valve regurgitation is trivial. No aortic stenosis is present.  5. The inferior vena cava is normal in size with greater than 50% respiratory variability, suggesting right atrial pressure of 3 mmHg. FINDINGS  Left Ventricle: Left ventricular ejection fraction, by estimation, is 60 to 65%. The left ventricle has normal function. The left ventricle has no regional  wall motion abnormalities. The left ventricular internal cavity size was normal in size. There is  no left ventricular hypertrophy. Left ventricular diastolic parameters are consistent with Grade I diastolic dysfunction (impaired relaxation). Normal left ventricular filling pressure. Right Ventricle: The right ventricular size is normal. Right vetricular wall thickness was not well visualized. Right ventricular systolic function is normal. Left Atrium: Left atrial size was normal in size. Right Atrium: Right atrial size was normal in size. Pericardium: There is no evidence of pericardial effusion. Mitral Valve: The mitral valve is normal in structure. No evidence of mitral valve regurgitation. No evidence of mitral valve stenosis. Tricuspid Valve: The tricuspid valve is normal in structure. Tricuspid valve regurgitation is not demonstrated. No evidence of tricuspid stenosis. Aortic Valve: The aortic valve is tricuspid. Aortic valve regurgitation is trivial. No aortic stenosis is present. Aortic valve mean gradient measures 8.0 mmHg. Aortic valve peak gradient measures 16.6 mmHg. Aortic valve area, by VTI measures 2.01 cm. Pulmonic Valve: The pulmonic valve was not well visualized. Pulmonic valve regurgitation is not visualized. No evidence of pulmonic stenosis. Aorta: The aortic root is normal in size and structure.  Venous: The inferior vena cava is normal in size with greater than 50% respiratory variability, suggesting right atrial pressure of 3 mmHg. IAS/Shunts: No atrial level shunt detected by color flow Doppler.  LEFT VENTRICLE PLAX 2D LVIDd:         5.10 cm      Diastology LVIDs:         3.00 cm      LV e' medial:    7.07 cm/s LV PW:         1.00 cm      LV E/e' medial:  13.0 LV IVS:        1.00 cm      LV e' lateral:   7.29 cm/s LVOT diam:     2.00 cm      LV E/e' lateral: 12.6 LV SV:         91 LV SV Index:   44 LVOT Area:     3.14 cm  LV Volumes (MOD) LV vol d, MOD A2C: 79.6 ml LV vol d, MOD A4C: 118.0 ml LV vol s, MOD A2C: 32.2 ml LV vol s, MOD A4C: 41.0 ml LV SV MOD A2C:     47.4 ml LV SV MOD A4C:     118.0 ml LV SV MOD BP:      60.8 ml RIGHT VENTRICLE RV S prime:     13.70 cm/s TAPSE (M-mode): 2.4 cm LEFT ATRIUM           Index        RIGHT ATRIUM           Index LA diam:      4.00 cm 1.93 cm/m   RA Area:     16.90 cm LA Vol (A2C): 56.0 ml 27.05 ml/m  RA Volume:   45.90 ml  22.17 ml/m LA Vol (A4C): 77.7 ml 37.54 ml/m  AORTIC VALVE AV Area (Vmax):    1.96 cm AV Area (Vmean):   2.09 cm AV Area (VTI):     2.01 cm AV Vmax:           204.00 cm/s AV Vmean:          130.000 cm/s AV VTI:            0.454 m AV Peak Grad:      16.6 mmHg AV Mean Grad:  8.0 mmHg LVOT Vmax:         127.00 cm/s LVOT Vmean:        86.300 cm/s LVOT VTI:          0.291 m LVOT/AV VTI ratio: 0.64  AORTA Ao Root diam: 3.40 cm MITRAL VALVE MV Area (PHT): 2.48 cm     SHUNTS MV Decel Time: 306 msec     Systemic VTI:  0.29 m MV E velocity: 92.20 cm/s   Systemic Diam: 2.00 cm MV A velocity: 115.00 cm/s MV E/A ratio:  0.80 Armida Lander MD Electronically signed by Armida Lander MD Signature Date/Time: 05/17/2023/11:29:13 AM    Final    DG Chest 2 View Result Date: 05/10/2023 CLINICAL DATA:  Cough, wheezing. EXAM: CHEST - 2 VIEW COMPARISON:  July 11, 2022. FINDINGS: The heart size and mediastinal contours are within normal limits. Left lung  is clear. Minimal right basilar atelectasis or possible infiltrate is noted. The visualized skeletal structures are unremarkable. IMPRESSION: Minimal right basilar opacity concerning for atelectasis or possibly infiltrate. Followup PA and lateral chest X-ray is recommended in 3-4 weeks following trial of antibiotic therapy to ensure resolution and exclude underlying malignancy. Electronically Signed   By: Rosalene Colon M.D.   On: 05/10/2023 11:31     ASSESSMENT & PLAN:   Assessment:  1.  Elevated free kappa light chains: - Workup for anemia with SPEP-no M spike, immunofixation-normal, kappa light chains 35.7, lambda light chains 25.3, ratio 1.41. - He has diabetes for 25 years and has tingling in the hands and feet.  2.  Social/family history: - Lives at home with his wife and is independent of ADLs and IADLs.  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  No exposure to chemicals.  Quit smoking in 1997, smoked 1 pack/day for 10 years. - Sister had kidney cancer, mother died of liver cancer.  Father had basal cell skin cancer.  Plan:  1.  Elevated serum free light chains: - Kappa light chains most likely elevated from mild CKD. - Hematology workup from 04/21/2022 showed elevation of light chain with normal ratio.  24-hour UPEP did not reveal monoclonal protein. -Elevated serum free light chain is from inflammation versus kidney dysfunction. -No additional workup is needed at this time.  2.  Normocytic anemia: -Labs from 05/24/2023 shows a hemoglobin of 10.8 (10.8) and a crit 34.3 with a slight bump in his monocyte count of 1.3.  Otherwise CBC and differential are WNL. -He received 2 doses of 300 mg IV Venofer  on 02/05/2023 and 02/12/2023. -Ferritin improved to 172 from 112. -No additional IV iron  needed at this time. -Previously have ruled out nutritional deficiencies and hemolysis.  Stool occult negative. -EGD/colonoscopy on 01/31/2022 did not  reveal any evidence of bleeding. -He was started on ferrous sulfate  325 mg daily and vitamin B12 supplements 500 mcg daily.  3.  Fatigue/shortness of breath: -She is followed closely by pulmonology and cardiology. -She had a recent echocardiogram with fairly unremarkable results.  EF was 60 to 65%. -She was recently treated for pneumonia which could certainly be contributing.  He also has history of atrial fibrillation and CHF. -He is on diuretics for fluid retention.   PLAN SUMMARY: >> No additional IV iron  needed. >> Return to clinic in 3 months with labs and virtual visit.    I spent 25 minutes dedicated to the care of this patient (face-to-face and non-face-to-face) on the date of  the encounter to include what is described in the assessment and plan.  Charlton Cooler, NP 05/31/2023 9:31 AM

## 2023-06-05 ENCOUNTER — Ambulatory Visit: Payer: Self-pay | Admitting: Family Medicine

## 2023-06-05 ENCOUNTER — Ambulatory Visit: Admitting: Family Medicine

## 2023-06-05 ENCOUNTER — Ambulatory Visit (HOSPITAL_COMMUNITY)
Admission: RE | Admit: 2023-06-05 | Discharge: 2023-06-05 | Disposition: A | Discharge status: Home / Self Care | Source: Ambulatory Visit | Attending: Family Medicine | Admitting: Family Medicine

## 2023-06-05 VITALS — BP 119/68 | HR 72 | Temp 98.1°F | Ht 64.0 in | Wt 229.0 lb

## 2023-06-05 DIAGNOSIS — J189 Pneumonia, unspecified organism: Secondary | ICD-10-CM | POA: Diagnosis not present

## 2023-06-05 DIAGNOSIS — R918 Other nonspecific abnormal finding of lung field: Secondary | ICD-10-CM | POA: Diagnosis not present

## 2023-06-05 DIAGNOSIS — R600 Localized edema: Secondary | ICD-10-CM

## 2023-06-05 MED ORDER — LEVOFLOXACIN 500 MG PO TABS: 500.0000 mg | ORAL_TABLET | Freq: Every day | ORAL | 0 refills | Status: AC

## 2023-06-05 NOTE — Progress Notes (Addendum)
 Subjective:    Patient ID: Paul Baker, male    DOB: 12-08-1950, 73 y.o.   MRN: [id number]  HPI Pt come sin today with lingering symptoms of Pneumonia including, cough, headache, shortness of breathe, and congestion.   Medications and allergies reviewed. Patient denies high fevers but does relate some chills relates cough congestion relates getting short of breath with activity Relates some swelling in his ankles but not severe already on torsemide  60 mg in the morning and occasionally 40 mg later in the day depending on the swelling Denies chest pressure tightness Recent pneumonia Discussed the use of AI scribe software for clinical note transcription with the patient, who gave verbal consent to proceed.  History of Present Illness   Paul Baker is a 73 year old male with chronic respiratory issues who presents with persistent cough and breathing difficulties.  He has been experiencing persistent coughing, particularly in the mornings, accompanied by phlegm production. The cough worsens with activities such as bending over, walking, or going outside, and is often accompanied by shortness of breath. He describes the sensation as 'overwhelming' and sometimes feels like his neck is 'locked up.'  His wife notes occasional wheezing, although it is not constant. Symptoms worsen when lying down, but he uses pillows to alleviate this. He frequently wakes up at night due to breathing difficulties, requiring him to sit on the edge of the bed. No night sweats are reported, but he wakes up feeling cold and shivering. He has not documented any fevers, with his temperature typically ranging from 97.40F to 65F.  He was previously prescribed azithromycin  and Augmentin  for his symptoms, but only experienced slight improvement. He denies any issues with regurgitation after eating. A recent chest x-ray was performed, and blood work was done ten days ago.  He has a history of low hemoglobin,  currently at 10.8, and has received iron  infusions without significant improvement. He also has chronic kidney disease. He has a history of blockages, with previous catheterizations showing blockages of 70% and 50%, and a prior blockage of 99% that resolved spontaneously. He is not aware of any antibiotic allergies except for a reaction to cubicin, which caused a rash.      Review of Systems     Objective:   Physical Exam Cough noted no crackles noted heart regular ankle edema noted       Assessment & Plan:   Pneumonia-Levaquin  7 days has already taken Z-Pak and Augmentin  Chest x-ray today Lab work ordered to look at [facility] Pedal edema continue torsemide  60 mg Consider adding 40 mg  Patient following with specialist for his hemoglobin possibly erythropoietin shots Patient also following pulmonary Also following with cardiology he relates that they may consider a right-sided heart catheterization  Assessment and Plan    Respiratory Infection Persistent cough, phlegm, and dyspnea with wheezing. Previous antibiotics ineffective. Chest x-ray shows significant infection. Normal leukocyte count. Differential includes pulmonary edema or unresolved infection. - Order follow-up chest x-ray today to assess for fluid buildup or infection resolution. - Prescribe Levaquin  500 mg once daily for 7 days, with possible extension based on response. - Instruct him to send an update via MyChart next week regarding symptoms and response to antibiotics. - Consider CT scan of lungs if x-ray does not show improvement or symptoms persist.  Fluid Retention Reports edema. Heart failure possible despite preserved ejection fraction. BNP testing indicated. - Order BNP blood test to evaluate for heart failure. - Monitor for signs of fluid  retention and adjust treatment as necessary.  Coronary Artery Disease (CAD) Previous blockages. Right heart catheterization may be needed to assess blockages and  pressures. - Coordinate with cardiology for potential right heart catheterization if symptoms persist or worsen.  Anemia secondary to Chronic Kidney Disease (CKD) Hemoglobin 10.8 likely due to CKD. Fatigue and dyspnea present. Treatment options include erythropoiesis-stimulating agents with stroke risk if hemoglobin increases excessively. - Monitor hemoglobin levels and consider erythropoiesis-stimulating agents if levels continue to decline, aiming for a target hemoglobin of 11-12. - Discuss potential risks and benefits of treatment with erythropoiesis-stimulating agents, including stroke risk.  Follow-up Multiple follow-up appointments scheduled. Ongoing monitoring and communication necessary. - Ensure follow-up appointment with cardiology on June 17 and primary care on June 24. - Instruct him to communicate via MyChart regarding symptoms and treatment response. - Review x-ray results and communicate findings to him.

## 2023-06-05 NOTE — Addendum Note (Signed)
 Addended by: Charlotta Cook A on: 06/05/2023 09:48 PM   Modules accepted: Level of Service

## 2023-06-06 LAB — BASIC METABOLIC PANEL WITH GFR
BUN/Creatinine Ratio: 27 — ABNORMAL HIGH (ref 10–24)
BUN: 38 mg/dL — ABNORMAL HIGH (ref 8–27)
CO2: 20 mmol/L (ref 20–29)
Calcium: 9.6 mg/dL (ref 8.6–10.2)
Chloride: 98 mmol/L (ref 96–106)
Creatinine, Ser: 1.43 mg/dL — ABNORMAL HIGH (ref 0.76–1.27)
Glucose: 210 mg/dL — ABNORMAL HIGH (ref 70–99)
Potassium: 4.9 mmol/L (ref 3.5–5.2)
Sodium: 138 mmol/L (ref 134–144)
eGFR: 52 mL/min/{1.73_m2} — ABNORMAL LOW (ref >59)

## 2023-06-06 LAB — BRAIN NATRIURETIC PEPTIDE: BNP: 20.5 pg/mL (ref 0.0–100.0)

## 2023-06-07 ENCOUNTER — Telehealth: Payer: Self-pay | Admitting: Neurology

## 2023-06-07 NOTE — Telephone Encounter (Signed)
 Pt called to Schedule appt .  Appt Scheduled

## 2023-06-08 DIAGNOSIS — E1159 Type 2 diabetes mellitus with other circulatory complications: Secondary | ICD-10-CM | POA: Diagnosis not present

## 2023-06-13 NOTE — Telephone Encounter (Signed)
 Nurses I would recommend doxycycline  100 mg twice daily for 7 days Take with a snack and a tall glass of liquids  I would also recommend a follow-up chest x-ray-order stat-if possible to do chest x-ray Thursday Friday or Monday-if chest x-ray is showing worsening issues we will progress toward doing a CAT scan  Please connect with [place] regarding this issue thank you-Dr. Geralyn Knee

## 2023-06-14 ENCOUNTER — Other Ambulatory Visit: Payer: Self-pay

## 2023-06-14 ENCOUNTER — Ambulatory Visit (HOSPITAL_COMMUNITY)
Admission: RE | Admit: 2023-06-14 | Discharge: 2023-06-14 | Disposition: A | Discharge status: Home / Self Care | Source: Ambulatory Visit | Attending: Family Medicine | Admitting: Family Medicine

## 2023-06-14 DIAGNOSIS — J189 Pneumonia, unspecified organism: Secondary | ICD-10-CM | POA: Insufficient documentation

## 2023-06-14 MED ORDER — DOXYCYCLINE HYCLATE 100 MG PO TABS: 100.0000 mg | ORAL_TABLET | Freq: Two times a day (BID) | ORAL | 0 refills | Status: AC

## 2023-06-14 NOTE — Telephone Encounter (Signed)
 See chart message.

## 2023-06-15 ENCOUNTER — Ambulatory Visit: Payer: Self-pay | Admitting: Family Medicine

## 2023-06-17 NOTE — H&P (View-Only) (Signed)
 Cardiology Office Note    Date:  06/19/2023  ID:  Paul Baker, DOB September 22, 1950, MRN [id number] Cardiologist: Jayson Sierras, MD    History of Present Illness:    Paul Baker is a 73 y.o. male with past medical history of CAD (90% stenosis along small non-dominant RCA in 2011, cath in 2020 showing nonobstructive CAD and Coronary CTA in 2024 showing moderate CAD but negative by FFR), chronic HFpEF, paroxysmal atrial fibrillation, HTN, HLD, Type II DM, and OSA (on CPAP) who presents to the office today for 1 month follow-up.  He was last examined by Paul Furth, PA in 05/2023 and reported still having some shortness of breath with exertion and episodes of chest pain. Had recently been treated for pneumonia by Pulmonology. Had experienced some lower extremity edema and was taking Torsemide  60 mg in AM/40 mg in PM. It was felt that his lung issues and deconditioning were likely contributing to his chest pain and shortness of breath and Imdur  was increased to 30 mg twice daily for antianginal benefit with close follow-up recommended. If persistent symptoms, may need to pursue [facility]. He did have follow-up labs with his PCP in the interim and BNP was normal at 20 when checked on 06/05/2023. Creatinine was up slightly to 1.43 and electrolytes were stable.  In talking with the patient today, he reports having dyspnea on exertion for over the past 4 months.  Recent chest x-ray earlier this month showed his lungs were clear. Reports having intermittent orthopnea but no PND. Uses CPAP at night. Has intermittent lower extremity edema and takes Torsemide  60 mg in AM/40 mg in PM. He does report having chest pain which typically occurs with exertion such as walking around the store. Tries not to utilize SL NTG. Says he sometimes feels pain in his jaw but this usually occurs when he feels like he is in atrial fibrillation. Today [birth year] birthday!  Studies Reviewed:   EKG: EKG is ordered today and  demonstrates:   EKG Interpretation Date/Time:  Tuesday June 19 2023 10:50:12 EDT Ventricular Rate:  75 PR Interval:  164 QRS Duration:  92 QT Interval:  398 QTC Calculation: 444 R Axis:   -15  Text Interpretation: Normal sinus rhythm Old Inferior infarct pattern. No acute changes Confirmed by Johnson Grate ([id number]) on 06/19/2023 10:59:46 AM       Coronary CTA: 04/2022 IMPRESSION: 1. Coronary calcium  score of 569. This was 75th percentile for age, sex, and race matched control.   2. Normal coronary origin with left dominance.   3. CAD-RADS 3. Moderate stenosis. Consider symptom-guided anti-ischemic pharmacotherapy as well as risk factor modification per guideline directed care. Additional analysis with CT FFR will be submitted.  IMPRESSION: 1. CT FFR analysis shows no evidence of significant functional stenosis.  Echocardiogram: 05/2023 IMPRESSIONS     1. Left ventricular ejection fraction, by estimation, is 60 to 65%. The  left ventricle has normal function. The left ventricle has no regional  wall motion abnormalities. Left ventricular diastolic parameters are  consistent with Grade I diastolic  dysfunction (impaired relaxation).   2. Right ventricular systolic function is normal. The right ventricular  size is normal.   3. The mitral valve is normal in structure. No evidence of mitral valve  regurgitation. No evidence of mitral stenosis.   4. The aortic valve is tricuspid. Aortic valve regurgitation is trivial.  No aortic stenosis is present.   5. The inferior vena cava is normal in size with greater than  50%  respiratory variability, suggesting right atrial pressure of 3 mmHg.     Risk Assessment/Calculations:    CHA2DS2-VASc Score = 4   This indicates a 4.8% annual risk of stroke. The patient's score is based upon: CHF History: 0 HTN History: 1 Diabetes History: 1 Stroke History: 0 Vascular Disease History: 1 Age Score: 1 Gender Score: 0    Physical Exam:   VS:  BP (!) 124/58 (BP Location: Left Arm)   Pulse 75   Ht 5' 4 (1.626 m)   Wt 231 lb (104.8 kg)   SpO2 96%   BMI 39.65 kg/m    Wt Readings from Last 3 Encounters:  06/19/23 231 lb (104.8 kg)  06/05/23 229 lb (103.9 kg)  05/31/23 228 lb 12.8 oz (103.8 kg)     GEN: Well nourished, well developed male appearing in no acute distress NECK: No JVD; No carotid bruits CARDIAC: RRR, no murmurs, rubs, gallops RESPIRATORY:  Clear to auscultation without rales, wheezing or rhonchi  ABDOMEN: Appears non-distended. No obvious abdominal masses. EXTREMITIES: No clubbing or cyanosis. Trace lower extremity edema.  Distal pedal pulses are 2+ bilaterally.   Assessment and Plan:    1. Coronary artery disease involving native coronary artery of native heart with angina pectoris [facility]) - Prior cardiac catheterization 2020 showed nonobstructive disease and Coronary CT in 04/2022 showed moderate CAD but not significant by FFR. He continues to report episodes of dyspnea on exertion and chest pain with exertion and is concerned about progressive CAD given that recent CXR showed no identifiable culprits. Reviewed with Dr. Debera and will plan for a repeat cardiac catheterization (right and left heart cath) for definitive evaluation. He is not on ASA given the need for anticoagulation.  Continue Zetia  10 mg daily, Imdur  30 mg twice daily, Toprol -XL 25 mg twice daily and Pravastatin  40 mg daily. Will hold Eliquis  2 days prior to his cardiac catheterization.  Informed Consent   Shared Decision Making/Informed Consent The risks [stroke (1 in 1000), death (1 in 1000), kidney failure [usually temporary] (1 in 500), bleeding (1 in 200), allergic reaction [possibly serious] (1 in 200)], benefits (diagnostic support and management of coronary artery disease) and alternatives of a cardiac catheterization were discussed in detail with Paul Baker and he is willing to proceed.     2. Chronic heart  failure with preserved ejection fraction (HCC) - Recent echocardiogram in 05/2023 showed a preserved EF of 60 to 65% with grade 1 diastolic dysfunction and normal RV function. - He does not appear volume overloaded by examination today and BNP was normal at 20.5 when checked earlier this month. Will plan for a RHC as discussed above. For now, continue current diuretic therapy with Torsemide  60 mg in AM/40 mg in PM.   3. PAF (paroxysmal atrial fibrillation) (HCC) - Reports occasional, brief palpitations but no persistent symptoms. Continue Toprol -XL 25 mg twice daily for rate control and he is aware to take short-acting Lopressor  if needed for palpitations. - Continue Eliquis  5 mg twice daily for anticoagulation which is the appropriate dose given his age, weight and renal function.  4. Essential hypertension, benign - BP is well-controlled at 124/58 during today's visit. Continue current medical therapy with Imdur  30 mg twice daily, Losartan  50 mg daily and Toprol -XL 25 mg twice daily.  5. Mixed hyperlipidemia - LDL was at 47 when checked in 04/2023. Continue current medical therapy with Zetia  10 mg daily and Pravastatin  40 mg daily. He was previously intolerant to higher intensity  statin therapy due to leg cramps.  6. Stage 3a chronic kidney disease (HCC) - Baseline creatinine 1.2 - 1.3. At 1.43 when checked on 06/05/2023.   Signed, Laymon CHRISTELLA Qua, PA-C

## 2023-06-17 NOTE — Progress Notes (Unsigned)
 Cardiology Office Note    Date:  06/19/2023  ID:  [name], DOB 05/11/1950, MRN [id number] Cardiologist: Teddie Favre, MD    History of Present Illness:    Paul Baker is a 73 y.o. male with past medical history of CAD (90% stenosis along small non-dominant RCA in 2011, cath in 2020 showing nonobstructive CAD and Coronary CTA in 2024 showing moderate CAD but negative by FFR), chronic HFpEF, paroxysmal atrial fibrillation, HTN, HLD, Type II DM, and OSA (on CPAP) who presents to the office today for 1 month follow-up.  He was last examined by Cadence Furth, PA in 05/2023 and reported still having some shortness of breath with exertion and episodes of chest pain. Had recently been treated for pneumonia by Pulmonology. Had experienced some lower extremity edema and was taking Torsemide  60 mg in AM/40 mg in PM. It was felt that his lung issues and deconditioning were likely contributing to his chest pain and shortness of breath and Imdur  was increased to 30 mg twice daily for antianginal benefit with close follow-up recommended. If persistent symptoms, may need to pursue [facility]. He did have follow-up labs with his PCP in the interim and BNP was normal at 20 when checked on 06/05/2023. Creatinine was up slightly to 1.43 and electrolytes were stable.  In talking with the patient today, he reports having dyspnea on exertion for over the past 4 months.  Recent chest x-ray earlier this month showed his lungs were clear. Reports having intermittent orthopnea but no PND. Uses CPAP at night. Has intermittent lower extremity edema and takes Torsemide  60 mg in AM/40 mg in PM. He does report having chest pain which typically occurs with exertion such as walking around the store. Tries not to utilize SL NTG. Says he sometimes feels pain in his jaw but this usually occurs when he feels like he is in atrial fibrillation. Today is his birthday!  Studies Reviewed:   EKG: EKG is ordered today and  demonstrates:   EKG Interpretation Date/Time:  Tuesday June 19 2023 10:50:12 EDT Ventricular Rate:  75 PR Interval:  164 QRS Duration:  92 QT Interval:  398 QTC Calculation: 444 R Axis:   -15  Text Interpretation: Normal sinus rhythm Old Inferior infarct pattern. No acute changes Confirmed by Woodfin Hays ([id number]) on 06/19/2023 10:59:46 AM       Coronary CTA: 04/2022 IMPRESSION: 1. Coronary calcium  score of 569. This was 75th percentile for age, sex, and race matched control.   2. Normal coronary origin with left dominance.   3. CAD-RADS 3. Moderate stenosis. Consider symptom-guided anti-ischemic pharmacotherapy as well as risk factor modification per guideline directed care. Additional analysis with CT FFR will be submitted.  IMPRESSION: 1. CT FFR analysis shows no evidence of significant functional stenosis.  Echocardiogram: 05/2023 IMPRESSIONS     1. Left ventricular ejection fraction, by estimation, is 60 to 65%. The  left ventricle has normal function. The left ventricle has no regional  wall motion abnormalities. Left ventricular diastolic parameters are  consistent with Grade I diastolic  dysfunction (impaired relaxation).   2. Right ventricular systolic function is normal. The right ventricular  size is normal.   3. The mitral valve is normal in structure. No evidence of mitral valve  regurgitation. No evidence of mitral stenosis.   4. The aortic valve is tricuspid. Aortic valve regurgitation is trivial.  No aortic stenosis is present.   5. The inferior vena cava is normal in size with greater than  50%  respiratory variability, suggesting right atrial pressure of 3 mmHg.     Risk Assessment/Calculations:    CHA2DS2-VASc Score = 4   This indicates a 4.8% annual risk of stroke. The patient's score is based upon: CHF History: 0 HTN History: 1 Diabetes History: 1 Stroke History: 0 Vascular Disease History: 1 Age Score: 1 Gender Score: 0    Physical Exam:   VS:  BP (!) 124/58 (BP Location: Left Arm)   Pulse 75   Ht 5' 4 (1.626 m)   Wt 231 lb (104.8 kg)   SpO2 96%   BMI 39.65 kg/m    Wt Readings from Last 3 Encounters:  06/19/23 231 lb (104.8 kg)  06/05/23 229 lb (103.9 kg)  05/31/23 228 lb 12.8 oz (103.8 kg)     GEN: Well nourished, well developed male appearing in no acute distress NECK: No JVD; No carotid bruits CARDIAC: RRR, no murmurs, rubs, gallops RESPIRATORY:  Clear to auscultation without rales, wheezing or rhonchi  ABDOMEN: Appears non-distended. No obvious abdominal masses. EXTREMITIES: No clubbing or cyanosis. Trace lower extremity edema.  Distal pedal pulses are 2+ bilaterally.   Assessment and Plan:    1. Coronary artery disease involving native coronary artery of native heart with angina pectoris [facility]) - Prior cardiac catheterization 2020 showed nonobstructive disease and Coronary CT in 04/2022 showed moderate CAD but not significant by FFR. He continues to report episodes of dyspnea on exertion and chest pain with exertion and is concerned about progressive CAD given that recent CXR showed no identifiable culprits. Reviewed with Dr. Londa Rival and will plan for a repeat cardiac catheterization (right and left heart cath) for definitive evaluation. He is not on ASA given the need for anticoagulation.  Continue Zetia  10 mg daily, Imdur  30 mg twice daily, Toprol -XL 25 mg twice daily and Pravastatin  40 mg daily. Will hold Eliquis  2 days prior to his cardiac catheterization.  Informed Consent   Shared Decision Making/Informed Consent The risks [stroke (1 in 1000), death (1 in 1000), kidney failure [usually temporary] (1 in 500), bleeding (1 in 200), allergic reaction [possibly serious] (1 in 200)], benefits (diagnostic support and management of coronary artery disease) and alternatives of a cardiac catheterization were discussed in detail with Mr. Janik and he is willing to proceed.     2. Chronic heart  failure with preserved ejection fraction (HCC) - Recent echocardiogram in 05/2023 showed a preserved EF of 60 to 65% with grade 1 diastolic dysfunction and normal RV function. - He does not appear volume overloaded by examination today and BNP was normal at 20.5 when checked earlier this month. Will plan for a RHC as discussed above. For now, continue current diuretic therapy with Torsemide  60 mg in AM/40 mg in PM.   3. PAF (paroxysmal atrial fibrillation) (HCC) - Reports occasional, brief palpitations but no persistent symptoms. Continue Toprol -XL 25 mg twice daily for rate control and he is aware to take short-acting Lopressor  if needed for palpitations. - Continue Eliquis  5 mg twice daily for anticoagulation which is the appropriate dose given his age, weight and renal function.  4. Essential hypertension, benign - BP is well-controlled at 124/58 during today's visit. Continue current medical therapy with Imdur  30 mg twice daily, Losartan  50 mg daily and Toprol -XL 25 mg twice daily.  5. Mixed hyperlipidemia - LDL was at 47 when checked in 04/2023. Continue current medical therapy with Zetia  10 mg daily and Pravastatin  40 mg daily. He was previously intolerant to higher intensity  statin therapy due to leg cramps.  6. Stage 3a chronic kidney disease (HCC) - Baseline creatinine 1.2 - 1.3. At 1.43 when checked on 06/05/2023.   Signed, Dorma Gash, PA-C

## 2023-06-19 ENCOUNTER — Ambulatory Visit: Attending: Student | Admitting: Student

## 2023-06-19 ENCOUNTER — Encounter: Payer: Self-pay | Admitting: Student

## 2023-06-19 VITALS — BP 124/58 | HR 75 | Ht 64.0 in | Wt 231.0 lb

## 2023-06-19 DIAGNOSIS — E782 Mixed hyperlipidemia: Secondary | ICD-10-CM | POA: Diagnosis not present

## 2023-06-19 DIAGNOSIS — I25119 Atherosclerotic heart disease of native coronary artery with unspecified angina pectoris: Secondary | ICD-10-CM | POA: Diagnosis not present

## 2023-06-19 DIAGNOSIS — I10 Essential (primary) hypertension: Secondary | ICD-10-CM

## 2023-06-19 DIAGNOSIS — I480 Paroxysmal atrial fibrillation: Secondary | ICD-10-CM

## 2023-06-19 DIAGNOSIS — I5032 Chronic diastolic (congestive) heart failure: Secondary | ICD-10-CM | POA: Diagnosis not present

## 2023-06-19 DIAGNOSIS — N1831 Chronic kidney disease, stage 3a: Secondary | ICD-10-CM

## 2023-06-19 NOTE — Patient Instructions (Addendum)
 Medication Instructions:  Your physician recommends that you continue on your current medications as directed. Please refer to the Current Medication list given to you today.  Hold Eliquis  2 day prior to Heart Cath on 07/02/23 Hold Torsemide  on the morning of your Heart Cath.  Hold Ozempic  one week prior to Heart Cath   *If you need a refill on your cardiac medications before your next appointment, please call your pharmacy*  Lab Work: Your physician recommends that you return for lab work in: Today ( CBC, BMET)   If you have labs (blood work) drawn today and your tests are completely normal, you will receive your results only by: MyChart Message (if you have MyChart) OR A paper copy in the mail If you have any lab test that is abnormal or we need to change your treatment, we will call you to review the results.  Testing/Procedures: Your physician has requested that you have a cardiac catheterization. Cardiac catheterization is used to diagnose and/or treat various heart conditions. Doctors may recommend this procedure for a number of different reasons. The most common reason is to evaluate chest pain. Chest pain can be a symptom of coronary artery disease (CAD), and cardiac catheterization can show whether plaque is narrowing or blocking your heart's arteries. This procedure is also used to evaluate the valves, as well as measure the blood flow and oxygen  levels in different parts of your heart. For further information please visit [email or web address] Please follow instruction sheet, as given.   Follow-Up: At [facility], you and your health needs are our priority.  As part of our continuing mission to provide you with exceptional heart care, our providers are all part of one team.  This team includes your primary Cardiologist (physician) and Advanced Practice Providers or APPs (Physician Assistants and Nurse Practitioners) who all work together to provide you with the care you need,  when you need it.  Your next appointment:   4 -6 week(s)  Provider:   You may see Teddie Favre, MD or one of the following Advanced Practice Providers on your designated Care Team:   Woodfin Hays, PA-C  [place], [place] Theotis Flake, [place]     We recommend signing up for the patient portal called MyChart.  Sign up information is provided on this After Visit Summary.  MyChart is used to connect with patients for Virtual Visits (Telemedicine).  Patients are able to view lab/test results, encounter notes, upcoming appointments, etc.  Non-urgent messages can be sent to your provider as well.   To learn more about what you can do with MyChart, go to [email or web address].   Other Instructions Thank you for choosing [name]!       [facility] A DEPT OF [facility] [address] [place] [address] Dept: [phone] Loc: [phone]  Paul Baker  06/19/2023  You are scheduled for a Cardiac Catheterization on Monday, June 30 with Dr. Veryl Gottron End.  1. Please arrive at the [facility] (Main Entrance A) at [facility]: [address] [place], [place] [address] at 7:00 AM (This time is 2 hour(s) before your procedure to ensure your preparation).   Free valet parking service is available. You will check in at ADMITTING. The support person will be asked to wait in the waiting room.  It is OK to have someone drop you off and come back when you are ready to be discharged.  Special note: Every effort is made to have your procedure done on time. Please understand that emergencies sometimes delay scheduled procedures.  2. Diet: Do not eat solid foods after midnight.  The patient may have clear liquids until 5am upon the day of the procedure.  3. Labs: You will need to have blood drawn on Tuesday, June 17 at [facility].  You do not need to be fasting.  4. Medication  instructions in preparation for your procedure:   Contrast Allergy: No  Stop taking Eliquis  (Apixiban) on Friday, June 27.  Stop Taking Ozempic  one week prior to your procedure  On the morning of your procedure, take your Aspirin  81 mg and any morning medicines NOT listed above.  You may use sips of water.  5. Plan to go home the same day, you will only stay overnight if medically necessary. 6. Bring a current list of your medications and current insurance cards. 7. You MUST have a responsible person to drive you home. 8. Someone MUST be with you the first 24 hours after you arrive home or your discharge will be delayed. 9. Please wear clothes that are easy to get on and off and wear slip-on shoes.  Thank you for allowing us  to care for you!   -- [facility]

## 2023-06-20 NOTE — Telephone Encounter (Signed)
 Nothing further needed

## 2023-06-21 DIAGNOSIS — I480 Paroxysmal atrial fibrillation: Secondary | ICD-10-CM | POA: Diagnosis not present

## 2023-06-21 DIAGNOSIS — I5032 Chronic diastolic (congestive) heart failure: Secondary | ICD-10-CM | POA: Diagnosis not present

## 2023-06-22 ENCOUNTER — Ambulatory Visit: Payer: Self-pay | Admitting: Student

## 2023-06-22 LAB — BASIC METABOLIC PANEL WITH GFR
BUN/Creatinine Ratio: 32 — ABNORMAL HIGH (ref 10–24)
BUN: 45 mg/dL — ABNORMAL HIGH (ref 8–27)
CO2: 19 mmol/L — ABNORMAL LOW (ref 20–29)
Calcium: 9.2 mg/dL (ref 8.6–10.2)
Chloride: 100 mmol/L (ref 96–106)
Creatinine, Ser: 1.41 mg/dL — ABNORMAL HIGH (ref 0.76–1.27)
Glucose: 347 mg/dL — ABNORMAL HIGH (ref 70–99)
Potassium: 4.9 mmol/L (ref 3.5–5.2)
Sodium: 138 mmol/L (ref 134–144)
eGFR: 53 mL/min/{1.73_m2} — ABNORMAL LOW (ref >59)

## 2023-06-22 LAB — CBC
Hematocrit: 32.8 % — ABNORMAL LOW (ref 37.5–51.0)
Hemoglobin: 10.5 g/dL — ABNORMAL LOW (ref 13.0–17.7)
MCH: 29.9 pg (ref 26.6–33.0)
MCHC: 32 g/dL (ref 31.5–35.7)
MCV: 93 fL (ref 79–97)
Platelets: 199 10*3/uL (ref 150–450)
RBC: 3.51 x10E6/uL — ABNORMAL LOW (ref 4.14–5.80)
RDW: 14 % (ref 11.6–15.4)
WBC: 7.9 10*3/uL (ref 3.4–10.8)

## 2023-06-25 ENCOUNTER — Encounter: Payer: Self-pay | Admitting: Hematology

## 2023-06-26 ENCOUNTER — Ambulatory Visit: Admitting: Adult Health

## 2023-06-26 ENCOUNTER — Encounter: Payer: Self-pay | Admitting: Adult Health

## 2023-06-26 VITALS — BP 124/65 | HR 79 | Ht 64.0 in | Wt 232.0 lb

## 2023-06-26 DIAGNOSIS — J4550 Severe persistent asthma, uncomplicated: Secondary | ICD-10-CM

## 2023-06-26 DIAGNOSIS — Z87891 Personal history of nicotine dependence: Secondary | ICD-10-CM | POA: Diagnosis not present

## 2023-06-26 DIAGNOSIS — I5032 Chronic diastolic (congestive) heart failure: Secondary | ICD-10-CM

## 2023-06-26 DIAGNOSIS — G4733 Obstructive sleep apnea (adult) (pediatric): Secondary | ICD-10-CM | POA: Diagnosis not present

## 2023-06-26 DIAGNOSIS — J189 Pneumonia, unspecified organism: Secondary | ICD-10-CM | POA: Diagnosis not present

## 2023-06-26 DIAGNOSIS — R0609 Other forms of dyspnea: Secondary | ICD-10-CM | POA: Diagnosis not present

## 2023-06-26 MED ORDER — BENZONATATE 200 MG PO CAPS: 200.0000 mg | ORAL_CAPSULE | Freq: Three times a day (TID) | ORAL | 1 refills | Status: AC | PRN

## 2023-06-26 MED ORDER — TRELEGY ELLIPTA 100-62.5-25 MCG/ACT IN AEPB: 1.0000 | INHALATION_SPRAY | Freq: Every day | RESPIRATORY_TRACT | 3 refills | Status: DC

## 2023-06-26 NOTE — Patient Instructions (Addendum)
 Liquid Mucinex  DM Twice daily  As needed  Cough and congestion  Tessalon  Three times a day  As needed  cough  Continue on Trelegy 1 puff daily  Albuterol  inhaler or Neb As needed   Activity as tolerated  Follow up with Cardiology next week as planned for Cardiac Cath.  Continue on Torsemide  as directed by Cards.  Follow up in 3 months with Paul Laursen NP and As needed   Please contact office for sooner follow up if symptoms do not improve or worsen or seek emergency care

## 2023-06-26 NOTE — Progress Notes (Signed)
 @Patient  ID: Paul Baker, male    DOB: 1950/04/26, 73 y.o.   MRN: [id number]  Chief Complaint  Patient presents with   Follow-up    Referring provider: Alphonsa Glendia LABOR, MD  HPI: 73 yo male former smoker followed for severe persistent asthma  Medical significant for obstructive sleep apnea on CPAP and restless leg syndrome followed by [facility] neurology.  History of coronary artery disease, fatty liver disease, right subclavian vein stenosis, spinal stenosis, DVT, psoriasis, septic arthritis in the left knee, diabetes, A-fib on Eliquis .  Diabetes , CKD   TEST/EVENTS :  Spirometry 10/17/11 >> 2.58 (89%), FEV1% 85 PFT 09/11/18 >> FEV1 2.19 (79%), FEV1% 82, TLC 4.48 (74%), DLCO 70%, +BD from change in FEF 25-75% 09/23/20 PFT normal FEV1 87%, ratio 86, FVC 74%, no significant bronchodilator response, DLCO 94%   HRCT chest 12/19/18 >> atherosclerosis, patchy air trapping, fatty liver, neg ILD    Chest x-ray February 2022 no acute pulmonary disease   PSG 2012 >> AHI 60   LHC 01/17/18 >> non obstructive CAD Echo 02/21/18 >> EF 55 to 60%  06/26/2023 Follow up : Asthma and Pneumonia  Discussed the use of AI scribe software for clinical note transcription with the patient, who gave verbal consent to proceed.  History of Present Illness   Paul Baker is a 73 year old male with asthma returns for follow up of recently diagnosed pneumonia.  Last visit patient had cough and congestion.  Chest x-ray showed a right basilar infiltrate/opacity.  He was treated for right lower lobe pneumonia with a Z-Pak and Augmentin .  Patient continued to have ongoing symptoms was seen by his primary care provider.  And given doxycycline .  Follow-up chest x-ray on June 14, 2023 showed resolution of pneumonia with clear lungs.  Some improvement.  But continues to have some ongoing intermittent cough that is minimally productive.   He remains on Trelegy inhaler daily.  Uses his albuterol  inhaler or  nebulizer on occasion.  He does have sleep apnea is on CPAP at bedtime.  He has a history of asthma and is currently using Trelegy once daily. He also uses a nebulizer and CPAP, although the CPAP sometimes feels suffocating. He has been using Delsym  for cough but has run out. There is some fluid production with his cough but no hemoptysis.  Patient complains he continues to have ongoing significant shortness of breath with activity and decreased activity tolerance. He is experiencing significant fluid retention, with swelling in his legs, neck, and face. His weight has increased from 224 to 231 pounds. Has been seen by Cardiology . Torsemide  increased to 100 mg daily. Has planned cardiac cath next week.   He is managing diabetes, which has been unstable with blood glucose levels reaching up to 400 mg/dL, particularly when he is unwell. He is on semaglutide  but had to pause it for a week due to an upcoming heart procedure.   Allergies  Allergen Reactions   Tape Rash    Pulls off skin   Farxiga [Dapagliflozin]     Yeast infections, felt sick with it   Cardizem  [Diltiazem  Hcl]     Edema    Cardura [Doxazosin Mesylate]     Headaches / cramps   Crestor  [Rosuvastatin ] Other (See Comments)    Leg cramps   Lipitor [Atorvastatin ] Other (See Comments)    Leg cramps   Paxil [Paroxetine Hcl]     Unknown reaction    Codeine Rash and Other (See Comments)  Headache    Gabapentin Rash    Immunization History  Administered Date(s) Administered   Fluad Quad(high Dose 65+) 11/03/2021   Fluad Trivalent(High Dose 65+) 09/20/2022   Influenza Split 09/18/2012   Influenza,inj,Quad PF,6+ Mos 10/13/2013, 10/06/2014, 09/11/2018   Influenza-Unspecified 12/01/2015, 11/08/2016, 11/02/2020   PFIZER(Purple Top)SARS-COV-2 Vaccination 03/06/2019, 03/27/2019   PNEUMOCOCCAL CONJUGATE-20 02/24/2022   Pneumococcal Polysaccharide-23 10/02/2004, 10/03/2011   Td 05/24/2006    Past Medical History:  Diagnosis  Date   Arthritis    Asthma    Atrial fibrillation [facility])    Diagnosed December 2021   Colon polyp    Coronary atherosclerosis of native coronary artery    a. 2011: cath showing 90% stenosis along small non-dominant RCA (too small for PCI). b. 01/2018: cath showing nonobstructive CAD with 60 to 70% proximal to mid nondominant RCA stenosis, 50% mid LAD and 40 to 50% OM1   DVT (deep venous thrombosis) (HCC) 2005   Right arm   Essential hypertension    Headache    History of transfusion    Hypothyroidism    Mixed hyperlipidemia    Morbid obesity (HCC)    MRSA (methicillin resistant staph aureus) culture positive    08/2012   Narcolepsy    OSA (obstructive sleep apnea)    CPAP   Osteoarthritis    Pneumonia    Psoriasis    Pulmonary embolism (HCC) 2004   RLS (restless legs syndrome)    Rotator cuff disorder    Left   Septic arthritis of knee, left (HCC)    Skin cancer, basal cell    Type 2 diabetes mellitus (HCC)     Tobacco History: Social History   Tobacco Use  Smoking Status Former   Current packs/day: 0.00   Average packs/day: 1.5 packs/day for 27.5 years (41.3 ttl pk-yrs)   Types: Cigarettes   Start date: 06/19/1967   Quit date: 01/03/1995   Years since quitting: 28.4   Passive exposure: Never  Smokeless Tobacco Never   Counseling given: Not Answered   Outpatient Medications Prior to Visit  Medication Sig Dispense Refill   acetaminophen  (TYLENOL ) 325 MG tablet Take 2 tablets (650 mg total) by mouth every 6 (six) hours as needed for mild pain (or Fever >/= 101). (Patient taking differently: Take 1,000 mg by mouth every 6 (six) hours as needed for mild pain (pain score 1-3) (or Fever >/= 101).) 30 tablet 1   albuterol  (PROVENTIL ) (2.5 MG/3ML) 0.083% nebulizer solution Take 3 mLs (2.5 mg total) by nebulization every 6 (six) hours as needed for up to 7 days for wheezing or shortness of breath. 84 mL 3   albuterol  (VENTOLIN  HFA) 108 (90 Base) MCG/ACT inhaler Inhale 2 puffs  into the lungs every 6 (six) hours as needed for wheezing or shortness of breath. 18 g 5   alfuzosin  (UROXATRAL ) 10 MG 24 hr tablet Take 10 mg by mouth daily.     Alpha Lipoic Acid -Biotin  ER 600-450 MG-MCG TBCR Take 600 mg by mouth every morning. (Patient taking differently: Take 600 mg by mouth at bedtime.)     amoxicillin -clavulanate (AUGMENTIN ) 875-125 MG tablet Take 1 tablet by mouth 2 (two) times daily. 14 tablet 0   apixaban  (ELIQUIS ) 5 MG TABS tablet Take 1 tablet (5 mg total) by mouth 2 (two) times daily. For stroke prevention 60 tablet 5   clobetasol  cream (TEMOVATE ) 0.05 % Apply thin amount 2 times daily prn to psoriasis not for face axilla or groin 45 g 1  Continuous Glucose Sensor (DEXCOM G7 SENSOR) MISC Change sensor every 10 days 3 each 3   Cyanocobalamin  (VITAMIN B 12 PO) Take by mouth.     desonide  (DESOWEN ) 0.05 % cream Apply once daily prn to facial rash 30 g 0   dextromethorphan -guaiFENesin  (MUCINEX  DM) 30-600 MG 12hr tablet Take 1 tablet by mouth 2 (two) times daily. (Patient taking differently: Take 1 tablet by mouth as needed for cough.) 20 tablet 0   ezetimibe  (ZETIA ) 10 MG tablet Take 10 mg by mouth daily.     FIASP  FLEXTOUCH 100 UNIT/ML FlexTouch Pen Inject 30-36 Units into the skin in the morning, at noon, and at bedtime.     fluticasone  (FLONASE ) 50 MCG/ACT nasal spray Place 1 spray into both nostrils daily. (Patient taking differently: Place 1 spray into both nostrils daily as needed.) 16 g 2   Glucagon  (GVOKE HYPOPEN  2-PACK) 1 MG/0.2ML SOAJ Inject 1 mg subcutaneously once as needed for low blood sugar. May repeat dose in 15 minutes as needed using a new device. (Patient taking differently: 2 mg. Inject 2 mg subcutaneously once as needed for low blood sugar. May repeat dose in 15 minutes as needed using a new device.) 0.4 mL 1   insulin  degludec (TRESIBA  FLEXTOUCH) 200 UNIT/ML FlexTouch Pen Inject 120 Units into the skin at bedtime.     Insulin  Pen Needle (B-D ULTRAFINE  III SHORT PEN) 31G X 8 MM MISC USE 1 PEN NEEDLE THREE TIMES DAILY 100 each 2   isosorbide  mononitrate (IMDUR ) 30 MG 24 hr tablet Take 1 tablet (30 mg total) by mouth 2 (two) times daily. 180 tablet 3   levothyroxine  (SYNTHROID ) 137 MCG tablet Take 1 tablet (137 mcg total) by mouth daily before breakfast. 90 tablet 3   loratadine  (CLARITIN ) 10 MG tablet Take 10 mg by mouth daily as needed for allergies.      losartan  (COZAAR ) 50 MG tablet Take 50 mg by mouth daily.     magnesium  30 MG tablet Take by mouth daily.     metoprolol  succinate (TOPROL  XL) 25 MG 24 hr tablet Take 1 tablet (25 mg total) by mouth 2 (two) times daily. 270 tablet 1   metoprolol  tartrate (LOPRESSOR ) 25 MG tablet Take 25 mg by mouth as needed (palpitations).     modafinil  (PROVIGIL ) 200 MG tablet Take 1 tablet (200 mg total) by mouth daily. 30 tablet 5   nitroGLYCERIN  (NITROSTAT ) 0.4 MG SL tablet DISSOLVE 1 TABLET UNDER TONGUE EVERY 5 MINUTES UP TO 15 MIN FOR CHESTPAIN. IF NO RELIEF CALL 911. 25 tablet 3   ONE TOUCH ULTRA TEST test strip TEST BLOOD SUGAR UP TO 4 TIMES DAILY. 150 each 5   ONETOUCH DELICA LANCETS 33G MISC USE AS DIRECTED TO TEST BLOOD SUGAR 4 TIMES DAILY. 150 each 5   pravastatin  (PRAVACHOL ) 40 MG tablet TAKE 1 TABLET BY MOUTH ONCE DAILY IN THE EVENING 90 tablet 2   rOPINIRole  (REQUIP  XL) 4 MG 24 hr tablet TAKE 1 TABLET BY MOUTH AT BEDTIME 90 tablet 1   rOPINIRole  (REQUIP ) 4 MG tablet Take 1/2 (one-half) tablet by mouth twice daily 90 tablet 0   Semaglutide , 1 MG/DOSE, 4 MG/3ML SOPN Inject 1 mg into the skin once a week. 9 mL 1   torsemide  (DEMADEX ) 20 MG tablet Take 3 tablets (60 mg total) by mouth every morning. & 40 mg in the evening 450 tablet 1   Fluticasone -Umeclidin-Vilant (TRELEGY ELLIPTA ) 100-62.5-25 MCG/ACT AEPB Inhale 1 puff into the lungs daily. 60  each 0   Fluticasone -Umeclidin-Vilant (TRELEGY ELLIPTA ) 100-62.5-25 MCG/ACT AEPB Inhale 1 puff into the lungs daily. 60 each 5   No facility-administered  medications prior to visit.     Review of Systems:   Constitutional:   No  weight loss, night sweats,  Fevers, chills,+ fatigue, or  lassitude.  HEENT:   No headaches,  Difficulty swallowing,  Tooth/dental problems, or  Sore throat,                No sneezing, itching, ear ache, nasal congestion, post nasal drip,   CV:  No chest pain,  Orthopnea, PND, +swelling in lower extremities, anasarca, dizziness, palpitations, syncope.   GI  No heartburn, indigestion, abdominal pain, nausea, vomiting, diarrhea, change in bowel habits, loss of appetite, bloody stools.   Resp:   No chest wall deformity  Skin: no rash or lesions.  GU: no dysuria, change in color of urine, no urgency or frequency.  No flank pain, no hematuria   MS:  No joint pain or swelling.  No decreased range of motion.  No back pain.    Physical Exam  BP 124/65   Pulse 79   Ht 5' 4 (1.626 m)   Wt 232 lb (105.2 kg)   SpO2 94% Comment: RA  BMI 39.82 kg/m   GEN: A/Ox3; pleasant , NAD, well nourished    HEENT:  [place]/AT,   NOSE-clear, THROAT-clear, no lesions, no postnasal drip or exudate noted.   NECK:  Supple w/ fair ROM; no JVD; normal carotid impulses w/o bruits; no thyromegaly or nodules palpated; no lymphadenopathy.    RESP  Clear  P & A; w/o, wheezes/ rales/ or rhonchi. no accessory muscle use, no dullness to percussion  CARD:  RRR, no m/r/g, tr  peripheral edema, pulses intact, no cyanosis or clubbing.  GI:   Soft & nt; nml bowel sounds; no organomegaly or masses detected.   Musco: Warm bil, no deformities or joint swelling noted.   Neuro: alert, no focal deficits noted.    Skin: Warm, no lesions or rashes    Lab Results:  CBC    Component Value Date/Time   WBC 7.9 06/21/2023 1037   WBC 9.8 05/24/2023 0857   RBC 3.51 (L) 06/21/2023 1037   RBC 3.68 (L) 05/24/2023 0857   HGB 10.5 (L) 06/21/2023 1037   HCT 32.8 (L) 06/21/2023 1037   PLT 199 06/21/2023 1037   MCV 93 06/21/2023 1037   MCH 29.9  06/21/2023 1037   MCH 29.3 05/24/2023 0857   MCHC 32.0 06/21/2023 1037   MCHC 31.5 05/24/2023 0857   RDW 14.0 06/21/2023 1037   LYMPHSABS 1.5 05/24/2023 0857   LYMPHSABS 1.6 09/01/2021 1506   MONOABS 1.3 (H) 05/24/2023 0857   EOSABS 0.4 05/24/2023 0857   EOSABS 0.3 09/01/2021 1506   BASOSABS 0.1 05/24/2023 0857   BASOSABS 0.1 09/01/2021 1506    BMET    Component Value Date/Time   NA 138 06/21/2023 1037   K 4.9 06/21/2023 1037   CL 100 06/21/2023 1037   CO2 19 (L) 06/21/2023 1037   GLUCOSE 347 (H) 06/21/2023 1037   GLUCOSE 195 (H) 05/24/2023 0857   BUN 45 (H) 06/21/2023 1037   CREATININE 1.41 (H) 06/21/2023 1037   CREATININE 1.11 02/06/2019 0813   CALCIUM  9.2 06/21/2023 1037   GFRNONAA 59 (L) 05/24/2023 0857   GFRNONAA 68 02/06/2019 0813   GFRAA 87 12/04/2019 0832   GFRAA 79 02/06/2019 0813    BNP  Component Value Date/Time   BNP 20.5 06/05/2023 1227   BNP 81.0 07/11/2022 1032    ProBNP    Component Value Date/Time   PROBNP 100.0 05/21/2020 1054    Imaging: DG Chest 2 View Result Date: 06/14/2023 CLINICAL DATA:  Follow-up pneumonia. EXAM: CHEST - 2 VIEW COMPARISON:  June 05, 2023. FINDINGS: The heart size and mediastinal contours are within normal limits. Both lungs are clear. The visualized skeletal structures are unremarkable. IMPRESSION: No active cardiopulmonary disease. Electronically Signed   By: Lynwood Landy Raddle M.D.   On: 06/14/2023 15:54   DG Chest 2 View Result Date: 06/05/2023 CLINICAL DATA:  Pneumonia EXAM: CHEST - 2 VIEW COMPARISON:  May 10, 2023 FINDINGS: Subtle interstitial prominence within the right upper lobe perihilar region and right middle lobe distribution could correlate with pneumonic developing infiltrates without consolidations. Left lung clear, heart and mediastinum normal IMPRESSION: Subtle right lung infiltrates as described. Electronically Signed   By: Franky Chard M.D.   On: 06/05/2023 14:06    Administration History     None           Latest Ref Rng & Units 09/20/2020    9:45 AM 11/21/2018    4:00 PM  PFT Results  FVC-Pre L 2.55  2.53   FVC-Predicted Pre % 69  68   FVC-Post L 2.72  2.68   FVC-Predicted Post % 74  72   Pre FEV1/FVC % % 84  81   Post FEV1/FCV % % 86  82   FEV1-Pre L 2.16  2.05   FEV1-Predicted Pre % 80  74   FEV1-Post L 2.34  2.19   DLCO uncorrected ml/min/mmHg 21.12  15.90   DLCO UNC% % 94  70   DLCO corrected ml/min/mmHg 21.12    DLCO COR %Predicted % 94    DLVA Predicted % 111  97   TLC L 4.37  4.48   TLC % Predicted % 72  74   RV % Predicted % 72  81     No results found for: NITRICOXIDE      Assessment & Plan:   No problem-specific Assessment & Plan notes found for this encounter.  Assessment and Plan    Asthma  -Mild flare with Pneumonia.  Slow to resolve requiring 3 courses of antibiotics.  Chest x-ray showed clearance of right lower lobe opacity. Recommend that he continue on Trelegy inhaler.  Patient assistance paperwork was completed.  Hold on additional antibiotics at this time.  Hold on steroids as he has underlying diabetes.  No significant wheezing on exam. Possible allergic component consider allergy workup on return visit.  Patient declines at this time.  Have discussed that possible Dupixent could be an option for him.  Will need lab work with allergen panel IgE and CBC with differential.     Chronic Kidney Disease  -follows cardiology.  Close management as on High-dose diuretics   Diabetes Mellitus   His diabetes is poorly controlled, with recent blood glucose levels reaching 400 mg/dL. There are concerns about prednisone  exacerbating hyperglycemia. He is on semaglutide  but has paused it for an upcoming procedure. Monitor blood glucose levels closely continue follow with primary care  Pneumonia  -right lower lobe CAP  Recent resolution of pneumonia is confirmed by a clear chest x-ray.  Suspect ongoing respiratory symptoms may be more related to fluid  retention with congestive heart failure.   Hold on additional antibiotics at this time.  Monitor for any signs of recurrent  infection, such as copious yellow-green sputum. If symptoms of infection recur, consider a sputum culture before initiating antibiotics.  Shortness of breath suspect is multifactorial.-Keep follow-up with cardiology and planned catheterization next week.  Congestive heart failure.-Significant weight gain and fluid retention despite diuretics.  Also has underlying chronic kidney disease.  Continue on current regimen and follow-up with cardiology.  A-fib continue on current regimen  Obstructive sleep apnea.  Continue on nocturnal CPAP.   Plan  Patient Instructions  Liquid Mucinex  DM Twice daily  As needed  Cough and congestion  Tessalon  Three times a day  As needed  cough  Continue on Trelegy 1 puff daily  Albuterol  inhaler or Neb As needed   Activity as tolerated  Follow up with Cardiology next week as planned for Cardiac Cath.  Continue on Torsemide  as directed by Cards.  Follow up in 3 months with Janari Gagner NP and As needed   Please contact office for sooner follow up if symptoms do not improve or worsen or seek emergency care           Madelin Stank, NP 06/26/2023

## 2023-06-27 ENCOUNTER — Other Ambulatory Visit: Payer: Self-pay | Admitting: Neurology

## 2023-06-27 DIAGNOSIS — G2581 Restless legs syndrome: Secondary | ICD-10-CM

## 2023-07-02 ENCOUNTER — Other Ambulatory Visit: Payer: Self-pay

## 2023-07-02 ENCOUNTER — Ambulatory Visit (HOSPITAL_COMMUNITY)
Admission: RE | Admit: 2023-07-02 | Discharge: 2023-07-02 | Disposition: A | Discharge status: Home / Self Care | Attending: Internal Medicine | Admitting: Internal Medicine

## 2023-07-02 ENCOUNTER — Encounter (HOSPITAL_COMMUNITY): Payer: Self-pay | Admitting: Internal Medicine

## 2023-07-02 ENCOUNTER — Encounter (HOSPITAL_COMMUNITY)
Admission: RE | Disposition: A | Discharge status: Home / Self Care | Payer: Self-pay | Source: Home / Self Care | Attending: Internal Medicine

## 2023-07-02 DIAGNOSIS — Z79899 Other long term (current) drug therapy: Secondary | ICD-10-CM | POA: Insufficient documentation

## 2023-07-02 DIAGNOSIS — Z7901 Long term (current) use of anticoagulants: Secondary | ICD-10-CM | POA: Diagnosis not present

## 2023-07-02 DIAGNOSIS — I25119 Atherosclerotic heart disease of native coronary artery with unspecified angina pectoris: Secondary | ICD-10-CM | POA: Insufficient documentation

## 2023-07-02 DIAGNOSIS — N1831 Chronic kidney disease, stage 3a: Secondary | ICD-10-CM | POA: Insufficient documentation

## 2023-07-02 DIAGNOSIS — E119 Type 2 diabetes mellitus without complications: Secondary | ICD-10-CM | POA: Insufficient documentation

## 2023-07-02 DIAGNOSIS — I2510 Atherosclerotic heart disease of native coronary artery without angina pectoris: Secondary | ICD-10-CM | POA: Diagnosis not present

## 2023-07-02 DIAGNOSIS — I5032 Chronic diastolic (congestive) heart failure: Secondary | ICD-10-CM | POA: Insufficient documentation

## 2023-07-02 DIAGNOSIS — R0609 Other forms of dyspnea: Secondary | ICD-10-CM

## 2023-07-02 DIAGNOSIS — I130 Hypertensive heart and chronic kidney disease with heart failure and stage 1 through stage 4 chronic kidney disease, or unspecified chronic kidney disease: Secondary | ICD-10-CM | POA: Insufficient documentation

## 2023-07-02 DIAGNOSIS — G4733 Obstructive sleep apnea (adult) (pediatric): Secondary | ICD-10-CM | POA: Diagnosis not present

## 2023-07-02 DIAGNOSIS — E782 Mixed hyperlipidemia: Secondary | ICD-10-CM | POA: Insufficient documentation

## 2023-07-02 DIAGNOSIS — I480 Paroxysmal atrial fibrillation: Secondary | ICD-10-CM | POA: Insufficient documentation

## 2023-07-02 HISTORY — PX: RIGHT/LEFT HEART CATH AND CORONARY ANGIOGRAPHY: CATH118266

## 2023-07-02 LAB — POCT I-STAT EG7
Acid-Base Excess: 0 mmol/L (ref 0.0–2.0)
Bicarbonate: 25.3 mmol/L (ref 20.0–28.0)
Calcium, Ion: 1.24 mmol/L (ref 1.15–1.40)
HCT: 29 % — ABNORMAL LOW (ref 39.0–52.0)
Hemoglobin: 9.9 g/dL — ABNORMAL LOW (ref 13.0–17.0)
O2 Saturation: 68 %
Potassium: 4.4 mmol/L (ref 3.5–5.1)
Sodium: 143 mmol/L (ref 135–145)
TCO2: 27 mmol/L (ref 22–32)
pCO2, Ven: 43.7 mmHg — ABNORMAL LOW (ref 44–60)
pH, Ven: 7.371 (ref 7.25–7.43)
pO2, Ven: 37 mmHg (ref 32–45)

## 2023-07-02 LAB — POCT I-STAT 7, (LYTES, BLD GAS, ICA,H+H)
Acid-base deficit: 1 mmol/L (ref 0.0–2.0)
Bicarbonate: 24.3 mmol/L (ref 20.0–28.0)
Calcium, Ion: 1.22 mmol/L (ref 1.15–1.40)
HCT: 29 % — ABNORMAL LOW (ref 39.0–52.0)
Hemoglobin: 9.9 g/dL — ABNORMAL LOW (ref 13.0–17.0)
O2 Saturation: 96 %
Potassium: 4.5 mmol/L (ref 3.5–5.1)
Sodium: 143 mmol/L (ref 135–145)
TCO2: 26 mmol/L (ref 22–32)
pCO2 arterial: 40.9 mmHg (ref 32–48)
pH, Arterial: 7.382 (ref 7.35–7.45)
pO2, Arterial: 84 mmHg (ref 83–108)

## 2023-07-02 LAB — GLUCOSE, CAPILLARY
Glucose-Capillary: 230 mg/dL — ABNORMAL HIGH (ref 70–99)
Glucose-Capillary: 134 mg/dL — ABNORMAL HIGH (ref 70–99)

## 2023-07-02 SURGERY — RIGHT/LEFT HEART CATH AND CORONARY ANGIOGRAPHY
Anesthesia: LOCAL

## 2023-07-02 MED ORDER — SODIUM CHLORIDE 0.9% FLUSH: 3.0000 mL | Freq: Two times a day (BID) | INTRAVENOUS | Status: DC

## 2023-07-02 MED ORDER — LIDOCAINE HCL (PF) 1 % IJ SOLN
INTRAMUSCULAR | Status: AC
  Filled 2023-07-02: qty 30

## 2023-07-02 MED ORDER — ACETAMINOPHEN 325 MG PO TABS: 650.0000 mg | ORAL_TABLET | ORAL | Status: DC | PRN

## 2023-07-02 MED ORDER — TORSEMIDE 20 MG PO TABS: 60.0000 mg | ORAL_TABLET | Freq: Two times a day (BID) | ORAL | Status: DC

## 2023-07-02 MED ORDER — ONDANSETRON HCL 4 MG/2ML IJ SOLN: 4.0000 mg | Freq: Four times a day (QID) | INTRAMUSCULAR | Status: DC | PRN

## 2023-07-02 MED ORDER — VERAPAMIL HCL 2.5 MG/ML IV SOLN
INTRAVENOUS | Status: AC
Start: 2023-07-02 — End: 2023-07-02
  Filled 2023-07-02: qty 2

## 2023-07-02 MED ORDER — ASPIRIN 81 MG PO CHEW: 81.0000 mg | CHEWABLE_TABLET | ORAL | Status: DC

## 2023-07-02 MED ORDER — SODIUM CHLORIDE 0.9 % WEIGHT BASED INFUSION
3.0000 mL/kg/h | INTRAVENOUS | Status: AC
  Administered 2023-07-02: 3 mL/kg/h via INTRAVENOUS

## 2023-07-02 MED ORDER — VERAPAMIL HCL 2.5 MG/ML IV SOLN
INTRAVENOUS | Status: AC
  Filled 2023-07-02: qty 2

## 2023-07-02 MED ORDER — LIDOCAINE HCL (PF) 1 % IJ SOLN
INTRAMUSCULAR | Status: DC | PRN
  Administered 2023-07-02 (×2): 2 mL

## 2023-07-02 MED ORDER — MIDAZOLAM HCL 2 MG/2ML IJ SOLN
INTRAMUSCULAR | Status: AC
  Filled 2023-07-02: qty 2

## 2023-07-02 MED ORDER — HEPARIN (PORCINE) IN NACL 1000-0.9 UT/500ML-% IV SOLN
INTRAVENOUS | Status: DC | PRN
  Administered 2023-07-02 (×2): 500 mL

## 2023-07-02 MED ORDER — FENTANYL CITRATE (PF) 100 MCG/2ML IJ SOLN
INTRAMUSCULAR | Status: AC
  Filled 2023-07-02: qty 2

## 2023-07-02 MED ORDER — LABETALOL HCL 5 MG/ML IV SOLN: 10.0000 mg | INTRAVENOUS | Status: DC | PRN

## 2023-07-02 MED ORDER — HYDRALAZINE HCL 20 MG/ML IJ SOLN: 10.0000 mg | INTRAMUSCULAR | Status: DC | PRN

## 2023-07-02 MED ORDER — SODIUM CHLORIDE 0.9 % IV SOLN: 250.0000 mL | INTRAVENOUS | Status: DC | PRN

## 2023-07-02 MED ORDER — IOHEXOL 350 MG/ML SOLN
INTRAVENOUS | Status: DC | PRN
  Administered 2023-07-02: 35 mL

## 2023-07-02 MED ORDER — HEPARIN SODIUM (PORCINE) 1000 UNIT/ML IJ SOLN
INTRAMUSCULAR | Status: DC | PRN
  Administered 2023-07-02: 5000 [IU] via INTRAVENOUS

## 2023-07-02 MED ORDER — HEPARIN (PORCINE) IN NACL 2-0.9 UNITS/ML
INTRAMUSCULAR | Status: DC | PRN
  Administered 2023-07-02: 10 mL via INTRA_ARTERIAL

## 2023-07-02 MED ORDER — MIDAZOLAM HCL 2 MG/2ML IJ SOLN
INTRAMUSCULAR | Status: DC | PRN
Start: 2023-07-02 — End: 2023-07-02
  Administered 2023-07-02: 1 mg via INTRAVENOUS

## 2023-07-02 MED ORDER — SODIUM CHLORIDE 0.9 % WEIGHT BASED INFUSION: 1.0000 mL/kg/h | INTRAVENOUS | Status: DC

## 2023-07-02 MED ORDER — SODIUM CHLORIDE 0.9% FLUSH: 3.0000 mL | INTRAVENOUS | Status: DC | PRN

## 2023-07-02 MED ORDER — HEPARIN SODIUM (PORCINE) 1000 UNIT/ML IJ SOLN
INTRAMUSCULAR | Status: AC
  Filled 2023-07-02: qty 10

## 2023-07-02 MED ORDER — FENTANYL CITRATE (PF) 100 MCG/2ML IJ SOLN
INTRAMUSCULAR | Status: DC | PRN
  Administered 2023-07-02: 25 ug via INTRAVENOUS

## 2023-07-02 SURGICAL SUPPLY — 9 items
CATH BALLN WEDGE 5F 110CM (CATHETERS) IMPLANT
CATH INFINITI 5 FR JL3.5 (CATHETERS) IMPLANT
CATH INFINITI 5FR MULTPACK ANG (CATHETERS) IMPLANT
DEVICE RAD COMP TR BAND LRG (VASCULAR PRODUCTS) IMPLANT
GLIDESHEATH SLEND SS 6F .021 (SHEATH) IMPLANT
GUIDEWIRE INQWIRE 1.5J.035X260 (WIRE) IMPLANT
PACK CARDIAC CATHETERIZATION (CUSTOM PROCEDURE TRAY) ×2 IMPLANT
SET ATX-X65L (MISCELLANEOUS) IMPLANT
SHEATH GLIDE SLENDER 4/5FR (SHEATH) IMPLANT

## 2023-07-02 NOTE — Discharge Instructions (Signed)

## 2023-07-02 NOTE — Brief Op Note (Signed)
 BRIEF CARDIAC CATHETERIZATION NOTE  07/02/2023  1:01 PM  PATIENT:  Paul Baker  73 y.o. male  PRE-OPERATIVE DIAGNOSIS:  chest pain, dypsnea on exertion  POST-OPERATIVE DIAGNOSIS:  * No post-op diagnosis entered *  PROCEDURE:  Procedure(s): RIGHT/LEFT HEART CATH AND CORONARY ANGIOGRAPHY (N/A)  SURGEON:  Surgeons and Role:    DEWAINE Mady Bruckner, MD - Primary  CONCLUSIONS: Stable appearance of the coronary arteries since cath in 2020 with 70% stenosis of non-dominant RCA and mild-moderate, non-obstructive LCA disease. Mildly to moderately elevated left heart and pulmonary artery pressures. Severely elevated right heart filling pressures. Normal Fick CO/CI.  RECOMMENDATIONS: Medical tx and risk factor modification to prevent progression of CAD. Increase torsemide  to 60 mg BID; will need repeat BMP in ~1 week. If no evidence of bleeding/vascular injury, resume apixaban  tomorrow morning.  Bruckner Mady, MD [facility]

## 2023-07-02 NOTE — Interval H&P Note (Signed)
 History and Physical Interval Note:  07/02/2023 12:14 PM  Paul Baker  has presented today for surgery, with the diagnosis of chest pain, dypsnea on exertion.  The various methods of treatment have been discussed with the patient and family. After consideration of risks, benefits and other options for treatment, the patient has consented to  Procedure(s): RIGHT/LEFT HEART CATH AND CORONARY ANGIOGRAPHY (N/A) as a surgical intervention.  The patient's history has been reviewed, patient examined, no change in status, stable for surgery.  I have reviewed the patient's chart and labs.  Questions were answered to the patient's satisfaction.    Cath Lab Visit (complete for each Cath Lab visit)  Clinical Evaluation Leading to the Procedure:   ACS: No.  Non-ACS:    Anginal Classification: CCS IV  Anti-ischemic medical therapy: Maximal Therapy (2 or more classes of medications)  Non-Invasive Test Results: No non-invasive testing performed  Prior CABG: No previous CABG  Kathlynn Swofford

## 2023-07-02 NOTE — Progress Notes (Signed)
 TR band removed at 1500, gauze dressing applied. Left radial level 0, clean, dry, and intact. Patient walked to the bathroom without difficulties.

## 2023-07-03 ENCOUNTER — Other Ambulatory Visit: Payer: Self-pay

## 2023-07-03 DIAGNOSIS — Z79899 Other long term (current) drug therapy: Secondary | ICD-10-CM

## 2023-07-03 DIAGNOSIS — N1831 Chronic kidney disease, stage 3a: Secondary | ICD-10-CM

## 2023-07-04 ENCOUNTER — Telehealth: Payer: Self-pay | Admitting: Pharmacy Technician

## 2023-07-04 NOTE — Telephone Encounter (Signed)
 PAP: Application for Eliquis has been submitted to [employer] ([employer]), via fax

## 2023-07-09 DIAGNOSIS — E1159 Type 2 diabetes mellitus with other circulatory complications: Secondary | ICD-10-CM | POA: Diagnosis not present

## 2023-07-09 DIAGNOSIS — E038 Other specified hypothyroidism: Secondary | ICD-10-CM | POA: Diagnosis not present

## 2023-07-09 NOTE — Telephone Encounter (Signed)
 I called [employer] and they have everything and his application is under review

## 2023-07-10 ENCOUNTER — Ambulatory Visit: Payer: Self-pay | Admitting: Student

## 2023-07-10 DIAGNOSIS — R42 Dizziness and giddiness: Secondary | ICD-10-CM | POA: Diagnosis not present

## 2023-07-10 DIAGNOSIS — I4891 Unspecified atrial fibrillation: Secondary | ICD-10-CM | POA: Diagnosis not present

## 2023-07-10 DIAGNOSIS — Z79899 Other long term (current) drug therapy: Secondary | ICD-10-CM

## 2023-07-10 DIAGNOSIS — N182 Chronic kidney disease, stage 2 (mild): Secondary | ICD-10-CM | POA: Diagnosis not present

## 2023-07-10 DIAGNOSIS — I129 Hypertensive chronic kidney disease with stage 1 through stage 4 chronic kidney disease, or unspecified chronic kidney disease: Secondary | ICD-10-CM | POA: Diagnosis not present

## 2023-07-10 DIAGNOSIS — Z6836 Body mass index (BMI) 36.0-36.9, adult: Secondary | ICD-10-CM | POA: Diagnosis not present

## 2023-07-10 DIAGNOSIS — E1122 Type 2 diabetes mellitus with diabetic chronic kidney disease: Secondary | ICD-10-CM | POA: Diagnosis not present

## 2023-07-10 DIAGNOSIS — R609 Edema, unspecified: Secondary | ICD-10-CM | POA: Diagnosis not present

## 2023-07-10 LAB — COMPREHENSIVE METABOLIC PANEL WITH GFR
ALT: 17 IU/L (ref 0–44)
AST: 17 IU/L (ref 0–40)
Albumin: 4.4 g/dL (ref 3.8–4.8)
Alkaline Phosphatase: 82 IU/L (ref 44–121)
BUN/Creatinine Ratio: 28 — ABNORMAL HIGH (ref 10–24)
BUN: 55 mg/dL — ABNORMAL HIGH (ref 8–27)
Bilirubin Total: 0.3 mg/dL (ref 0.0–1.2)
CO2: 19 mmol/L — ABNORMAL LOW (ref 20–29)
Calcium: 9.3 mg/dL (ref 8.6–10.2)
Chloride: 95 mmol/L — ABNORMAL LOW (ref 96–106)
Creatinine, Ser: 1.95 mg/dL — ABNORMAL HIGH (ref 0.76–1.27)
Globulin, Total: 2.5 g/dL (ref 1.5–4.5)
Glucose: 461 mg/dL — ABNORMAL HIGH (ref 70–99)
Potassium: 5.4 mmol/L — ABNORMAL HIGH (ref 3.5–5.2)
Sodium: 135 mmol/L (ref 134–144)
Total Protein: 6.9 g/dL (ref 6.0–8.5)
eGFR: 36 mL/min/1.73 — ABNORMAL LOW (ref >59)

## 2023-07-10 LAB — BASIC METABOLIC PANEL WITH GFR
BUN/Creatinine Ratio: 30 — ABNORMAL HIGH (ref 10–24)
BUN: 57 mg/dL — ABNORMAL HIGH (ref 8–27)
CO2: 22 mmol/L (ref 20–29)
Calcium: 9.2 mg/dL (ref 8.6–10.2)
Chloride: 95 mmol/L — ABNORMAL LOW (ref 96–106)
Creatinine, Ser: 1.9 mg/dL — ABNORMAL HIGH (ref 0.76–1.27)
Glucose: 451 mg/dL — ABNORMAL HIGH (ref 70–99)
Potassium: 5.4 mmol/L — ABNORMAL HIGH (ref 3.5–5.2)
Sodium: 134 mmol/L (ref 134–144)
eGFR: 37 mL/min/1.73 — ABNORMAL LOW (ref >59)

## 2023-07-10 LAB — T4, FREE: Free T4: 1.21 ng/dL (ref 0.82–1.77)

## 2023-07-10 LAB — TSH: TSH: 1.96 u[IU]/mL (ref 0.450–4.500)

## 2023-07-10 MED ORDER — TORSEMIDE 20 MG PO TABS
ORAL_TABLET | ORAL | Status: DC
Start: 2023-07-10 — End: 2023-07-24

## 2023-07-10 NOTE — Telephone Encounter (Signed)
-----   Message from Laymon CHRISTELLA Qua sent at 07/10/2023  7:20 AM EDT ----- Please let the patient know that his kidney function has worsened following recent dose adjustment of Torsemide  following his cardiac catheterization.  Creatinine was previously 1.41 and is now up to  1.90. Potassium also elevated at 5.4. Would hold Torsemide  today and then resume at his prior dose of 60 mg in AM/40 mg in PM.  Glucose also significantly elevated at 451. Would plan for a repeat  BMET on Friday to make sure potassium has normalized and kidney function is starting to improve. ----- Message ----- From: Interface, [facility] Lab Results In Sent: 07/10/2023   5:37 AM EDT To: Laymon CHRISTELLA Qua, PA-C

## 2023-07-10 NOTE — Telephone Encounter (Signed)
 The patient has been notified of the result and verbalized understanding.  All questions (if any) were answered. Bernett Dorothyann LABOR, RN 07/10/2023 8:02 AM    He has already taken am dose today of torsemide  but will hold pm dose.  Patient will repeat labs at [facility] this Friday

## 2023-07-13 ENCOUNTER — Other Ambulatory Visit (HOSPITAL_COMMUNITY): Payer: Self-pay

## 2023-07-13 ENCOUNTER — Encounter: Payer: Self-pay | Admitting: Hematology

## 2023-07-13 DIAGNOSIS — Z79899 Other long term (current) drug therapy: Secondary | ICD-10-CM | POA: Diagnosis not present

## 2023-07-13 MED ORDER — DABIGATRAN ETEXILATE MESYLATE 150 MG PO CAPS: 150.0000 mg | ORAL_CAPSULE | Freq: Two times a day (BID) | ORAL | 11 refills | Status: AC

## 2023-07-13 NOTE — Telephone Encounter (Signed)
 Patient called back and is asking if he can be changed to generic pradaxa . If he can, can that be sent to his pharmacy to replace the eliquis ? Thank you!

## 2023-07-13 NOTE — Telephone Encounter (Signed)
 I spoke with patient and informed him that he may stop the Eliquis  and start Dabigatran  (Pradaxa ) 150 mg twice a day   E-scribed to [employer]

## 2023-07-13 NOTE — Telephone Encounter (Signed)
 Denied patient assistance per [employer]. They said he should be eligible in oct based on oop.   Per test claims: Eliquis  would be 152.71 for 30 days  Xarelto  would be 150.63 for 30 days  Dabigatran  would be 53.03 for 30 days   I called the patient and lmom for him to call me back to go over his options.

## 2023-07-14 LAB — BASIC METABOLIC PANEL WITH GFR
BUN/Creatinine Ratio: 34 — ABNORMAL HIGH (ref 10–24)
BUN: 60 mg/dL — ABNORMAL HIGH (ref 8–27)
CO2: 22 mmol/L (ref 20–29)
Calcium: 9.6 mg/dL (ref 8.6–10.2)
Chloride: 99 mmol/L (ref 96–106)
Creatinine, Ser: 1.76 mg/dL — ABNORMAL HIGH (ref 0.76–1.27)
Glucose: 182 mg/dL — ABNORMAL HIGH (ref 70–99)
Potassium: 5.3 mmol/L — ABNORMAL HIGH (ref 3.5–5.2)
Sodium: 140 mmol/L (ref 134–144)
eGFR: 41 mL/min/1.73 — ABNORMAL LOW (ref >59)

## 2023-07-16 ENCOUNTER — Telehealth: Payer: Self-pay | Admitting: Nurse Practitioner

## 2023-07-16 ENCOUNTER — Other Ambulatory Visit: Payer: Self-pay

## 2023-07-16 DIAGNOSIS — Z79899 Other long term (current) drug therapy: Secondary | ICD-10-CM

## 2023-07-16 NOTE — Telephone Encounter (Signed)
 Patient was called and made aware that his PAP Ozempic  and Tresiba  is here for pick up

## 2023-07-16 NOTE — Telephone Encounter (Signed)
 Pt picked up PAP of ozempic  and tresiba 

## 2023-07-20 DIAGNOSIS — Z79899 Other long term (current) drug therapy: Secondary | ICD-10-CM | POA: Diagnosis not present

## 2023-07-21 LAB — BASIC METABOLIC PANEL WITH GFR
BUN/Creatinine Ratio: 33 — ABNORMAL HIGH (ref 10–24)
BUN: 67 mg/dL — ABNORMAL HIGH (ref 8–27)
CO2: 23 mmol/L (ref 20–29)
Calcium: 9.2 mg/dL (ref 8.6–10.2)
Chloride: 98 mmol/L (ref 96–106)
Creatinine, Ser: 2.04 mg/dL — ABNORMAL HIGH (ref 0.76–1.27)
Glucose: 95 mg/dL (ref 70–99)
Potassium: 4.9 mmol/L (ref 3.5–5.2)
Sodium: 138 mmol/L (ref 134–144)
eGFR: 34 mL/min/1.73 — ABNORMAL LOW (ref >59)

## 2023-07-23 ENCOUNTER — Ambulatory Visit: Payer: Self-pay | Admitting: Student

## 2023-07-23 DIAGNOSIS — Z79899 Other long term (current) drug therapy: Secondary | ICD-10-CM

## 2023-07-24 MED ORDER — TORSEMIDE 20 MG PO TABS: 40.0000 mg | ORAL_TABLET | Freq: Two times a day (BID) | ORAL | Status: AC

## 2023-07-24 NOTE — Telephone Encounter (Signed)
-----   Message from Laymon CHRISTELLA Qua sent at 07/23/2023  4:58 PM EDT ----- Please let the patient know his potassium has normalized. Kidney function continues to worsen as creatinine is now at 2.04. Has his weight been stable? Any acute changes? Would recommend to reduce  Torsemide  to 40mg  twice daily and check a BMET again in 7-10 days.   ----- Message ----- From: Interface, [facility] Lab Results In Sent: 07/21/2023   5:37 AM EDT To: Laymon CHRISTELLA Qua, PA-C

## 2023-07-24 NOTE — Telephone Encounter (Signed)
 Recent lab results discussed with patient. He says he has frequent weight fluctuations like yesterday he was 223 lbs, today is 225.8 lbs  He will reduce torsemide  to 40 mg bid and repeat bmet next week.

## 2023-07-24 NOTE — Addendum Note (Signed)
 Addended by: Samiyah Stupka A on: 07/24/2023 08:38 AM   Modules accepted: Orders

## 2023-08-01 ENCOUNTER — Ambulatory Visit: Admitting: Cardiology

## 2023-08-03 DIAGNOSIS — Z79899 Other long term (current) drug therapy: Secondary | ICD-10-CM | POA: Diagnosis not present

## 2023-08-04 LAB — BASIC METABOLIC PANEL WITH GFR
BUN/Creatinine Ratio: 24 (ref 10–24)
BUN: 33 mg/dL — ABNORMAL HIGH (ref 8–27)
CO2: 19 mmol/L — ABNORMAL LOW (ref 20–29)
Calcium: 8.8 mg/dL (ref 8.6–10.2)
Chloride: 104 mmol/L (ref 96–106)
Creatinine, Ser: 1.35 mg/dL — ABNORMAL HIGH (ref 0.76–1.27)
Glucose: 218 mg/dL — ABNORMAL HIGH (ref 70–99)
Potassium: 5.1 mmol/L (ref 3.5–5.2)
Sodium: 137 mmol/L (ref 134–144)
eGFR: 56 mL/min/1.73 — ABNORMAL LOW (ref >59)

## 2023-08-06 ENCOUNTER — Ambulatory Visit: Payer: Medicare HMO | Admitting: Family Medicine

## 2023-08-07 ENCOUNTER — Other Ambulatory Visit: Payer: Self-pay

## 2023-08-07 MED ORDER — METOPROLOL SUCCINATE ER 25 MG PO TB24: 25.0000 mg | ORAL_TABLET | Freq: Two times a day (BID) | ORAL | 3 refills | Status: DC

## 2023-08-09 ENCOUNTER — Encounter: Payer: Self-pay | Admitting: Nurse Practitioner

## 2023-08-09 ENCOUNTER — Ambulatory Visit: Admitting: Nurse Practitioner

## 2023-08-09 VITALS — BP 128/66 | HR 80 | Ht 64.0 in | Wt 226.6 lb

## 2023-08-09 DIAGNOSIS — I10 Essential (primary) hypertension: Secondary | ICD-10-CM

## 2023-08-09 DIAGNOSIS — Z7985 Long-term (current) use of injectable non-insulin antidiabetic drugs: Secondary | ICD-10-CM | POA: Diagnosis not present

## 2023-08-09 DIAGNOSIS — Z794 Long term (current) use of insulin: Secondary | ICD-10-CM | POA: Diagnosis not present

## 2023-08-09 DIAGNOSIS — E1159 Type 2 diabetes mellitus with other circulatory complications: Secondary | ICD-10-CM | POA: Diagnosis not present

## 2023-08-09 DIAGNOSIS — E782 Mixed hyperlipidemia: Secondary | ICD-10-CM

## 2023-08-09 DIAGNOSIS — E038 Other specified hypothyroidism: Secondary | ICD-10-CM | POA: Diagnosis not present

## 2023-08-09 LAB — POCT GLYCOSYLATED HEMOGLOBIN (HGB A1C): Hemoglobin A1C: 8.1 % — AB (ref 4.0–5.6)

## 2023-08-09 MED ORDER — LEVOTHYROXINE SODIUM 137 MCG PO TABS: 137.0000 ug | ORAL_TABLET | Freq: Every day | ORAL | 3 refills | Status: AC

## 2023-08-09 NOTE — Progress Notes (Signed)
 08/09/2023   Endocrinology follow-up note   Subjective:    Patient ID: Paul Baker, male    DOB: [birth year], PCP Alphonsa Glendia LABOR, MD   Past Medical History:  Diagnosis Date   Arthritis    Asthma    Atrial fibrillation [facility])    Diagnosed December 2021   Colon polyp    Coronary atherosclerosis of native coronary artery    a. 2011: cath showing 90% stenosis along small non-dominant RCA (too small for PCI). b. 01/2018: cath showing nonobstructive CAD with 60 to 70% proximal to mid nondominant RCA stenosis, 50% mid LAD and 40 to 50% OM1   DVT (deep venous thrombosis) (HCC) 2005   Right arm   Essential hypertension    Headache    History of transfusion    Hypothyroidism    Mixed hyperlipidemia    Morbid obesity (HCC)    MRSA (methicillin resistant staph aureus) culture positive    08/2012   Narcolepsy    OSA (obstructive sleep apnea)    CPAP   Osteoarthritis    Pneumonia    Psoriasis    Pulmonary embolism (HCC) 2004   RLS (restless legs syndrome)    Rotator cuff disorder    Left   Septic arthritis of knee, left (HCC)    Skin cancer, basal cell    Type 2 diabetes mellitus (HCC)    Past Surgical History:  Procedure Laterality Date   BACK SURGERY     BIOPSY  01/31/2022   Procedure: BIOPSY;  Surgeon: Kriss Estefana DEL, DO;  Location: [facility];  Service: Gastroenterology;;   CATARACT EXTRACTION Bilateral    COLONOSCOPY     COLONOSCOPY WITH PROPOFOL  N/A 01/31/2022   Procedure: COLONOSCOPY WITH PROPOFOL ;  Surgeon: Kriss Estefana DEL, DO;  Location: [facility];  Service: Gastroenterology;  Laterality: N/A;   CYST REMOVAL TRUNK     from back   ESOPHAGOGASTRODUODENOSCOPY (EGD) WITH PROPOFOL  N/A 01/31/2022   Procedure: ESOPHAGOGASTRODUODENOSCOPY (EGD) WITH PROPOFOL ;  Surgeon: Kriss Estefana DEL, DO;  Location: [facility];  Service: Gastroenterology;  Laterality: N/A;   EYE SURGERY Left 2016   laser to left eye   HIP SURGERY     bone removed from both sides of hip    KNEE ARTHROTOMY Right 12/04/2014   Procedure: KNEE ARTHROTOMY PATELLA LIGAMENT RECONSTRUSION AND REPAIR RIGHT KNEE;  Surgeon: Donnice Car, MD;  Location: [facility];  Service: Orthopedics;  Laterality: Right;   KNEE SURGERY     X 25 TIMES   LEFT HEART CATH AND CORONARY ANGIOGRAPHY N/A 01/17/2018   Procedure: LEFT HEART CATH AND CORONARY ANGIOGRAPHY;  Surgeon: Claudene Victory ORN, MD;  Location: [facility];  Service: Cardiovascular;  Laterality: N/A;   LUMBAR DISC SURGERY     Left L3, L4, L5 discecotomy with decompression of L4 root   POLYPECTOMY  01/31/2022   Procedure: POLYPECTOMY;  Surgeon: Kriss Estefana DEL, DO;  Location: [facility];  Service: Gastroenterology;;   RIGHT/LEFT HEART CATH AND CORONARY ANGIOGRAPHY N/A 07/02/2023   Procedure: RIGHT/LEFT HEART CATH AND CORONARY ANGIOGRAPHY;  Surgeon: Mady Bruckner, MD;  Location: [facility];  Service: Cardiovascular;  Laterality: N/A;   TONSILLECTOMY     TOTAL KNEE ARTHROPLASTY  2003   LEFT   TOTAL KNEE ARTHROPLASTY Right 03/23/2014   Procedure: RIGHT TOTAL KNEE ARTHROPLASTY AND REMOVAL RIGHT TIBIAL  DEEP IMPLANT STAPLE;  Surgeon: Donnice Car, MD;  Location: [facility];  Service: Orthopedics;  Laterality: Right;   TOTAL KNEE REVISION  2005  LEFT   WRIST SURGERY     Social History   Socioeconomic History   Marital status: Married    Spouse name: Engineer, materials   Number of children: 2   Years of education: college   Highest education level: Not on file  Occupational History   Occupation: Disabled    Employer: UNEMPLOYED  Tobacco Use   Smoking status: Former    Current packs/day: 0.00    Average packs/day: 1.5 packs/day for 27.5 years (41.3 ttl pk-yrs)    Types: Cigarettes    Start date: 06/19/1967    Quit date: 01/03/1995    Years since quitting: 28.6    Passive exposure: Never   Smokeless tobacco: Never  Vaping Use   Vaping status: Never Used  Substance and Sexual Activity   Alcohol  use: No    Alcohol /week: 0.0 standard drinks  of alcohol     Comment: quit drinking in 07/86   Drug use: No   Sexual activity: Yes    Partners: Female  Other Topics Concern   Not on file  Social History Narrative    73 year old, right-handed, caucasian male with a past medical history of obesity, hypertension, hyperlipidemia, diabetes, obstructive sleep apnea, presenting with frequent nighttime awakenings, excessive daytime sleepiness, also transient confusional episodes.RLS and one beosity, OSA on CPAP with AHI of 3.2 and  setting of 16 cm water , [facility] .   Married since 1977.   Social Drivers of Corporate investment banker Strain: Low Risk  (05/05/2022)   Overall Financial Resource Strain (CARDIA)    Difficulty of Paying Living Expenses: Not hard at all  Food Insecurity: Patient Declined (07/12/2022)   Hunger Vital Sign    Worried About Running Out of Food in the Last Year: Patient declined    Ran Out of Food in the Last Year: Patient declined  Transportation Needs: No Transportation Needs (07/17/2022)   PRAPARE - Administrator, Civil Service (Medical): No    Lack of Transportation (Non-Medical): No  Physical Activity: Insufficiently Active (05/05/2022)   Exercise Vital Sign    Days of Exercise per Week: 3 days    Minutes of Exercise per Session: 30 min  Stress: No Stress Concern Present (05/05/2022)   [employer] of Occupational Health - Occupational Stress Questionnaire    Feeling of Stress : Not at all  Social Connections: Socially Integrated (05/05/2022)   Social Connection and Isolation Panel    Frequency of Communication with Friends and Family: More than three times a week    Frequency of Social Gatherings with Friends and Family: More than three times a week    Attends Religious Services: More than 4 times per year    Active Member of [employer] or Organizations: Yes    Attends Banker Meetings: More than 4 times per year    Marital Status: Married   Outpatient Encounter Medications as  of 08/09/2023  Medication Sig   acetaminophen  (TYLENOL ) 325 MG tablet Take 2 tablets (650 mg total) by mouth every 6 (six) hours as needed for mild pain (or Fever >/= 101).   albuterol  (PROVENTIL ) (2.5 MG/3ML) 0.083% nebulizer solution Take 3 mLs (2.5 mg total) by nebulization every 6 (six) hours as needed for up to 7 days for wheezing or shortness of breath.   albuterol  (VENTOLIN  HFA) 108 (90 Base) MCG/ACT inhaler Inhale 2 puffs into the lungs every 6 (six) hours as needed for wheezing or shortness of breath.   alfuzosin  (UROXATRAL ) 10 MG 24  hr tablet Take 10 mg by mouth daily.   Alpha Lipoic Acid -Biotin  ER 600-450 MG-MCG TBCR Take 600 mg by mouth every morning.   benzonatate  (TESSALON ) 200 MG capsule Take 1 capsule (200 mg total) by mouth 3 (three) times daily as needed.   clobetasol  cream (TEMOVATE ) 0.05 % Apply thin amount 2 times daily prn to psoriasis not for face axilla or groin   Continuous Glucose Sensor (DEXCOM G7 SENSOR) MISC Change sensor every 10 days   Cyanocobalamin  (VITAMIN B 12 PO) Take 5,000 mcg by mouth daily.   dabigatran  (PRADAXA ) 150 MG CAPS capsule Take 1 capsule (150 mg total) by mouth 2 (two) times daily.   desonide  (DESOWEN ) 0.05 % cream Apply once daily prn to facial rash   dextromethorphan -guaiFENesin  (MUCINEX  DM) 30-600 MG 12hr tablet Take 1 tablet by mouth 2 (two) times daily. (Patient taking differently: Take 1 tablet by mouth as needed for cough.)   insulin  degludec (TRESIBA  FLEXTOUCH) 200 UNIT/ML FlexTouch Pen Inject 120 Units into the skin at bedtime.   Insulin  Pen Needle (B-D ULTRAFINE III SHORT PEN) 31G X 8 MM MISC USE 1 PEN NEEDLE THREE TIMES DAILY   isosorbide  mononitrate (IMDUR ) 30 MG 24 hr tablet Take 1 tablet (30 mg total) by mouth 2 (two) times daily. (Patient taking differently: Take 30 mg by mouth daily.)   loratadine  (CLARITIN ) 10 MG tablet Take 10 mg by mouth daily as needed for allergies.    losartan  (COZAAR ) 50 MG tablet Take 50 mg by mouth daily.    magnesium  30 MG tablet Take 30 mg by mouth daily. (Patient taking differently: Take 400 mg by mouth daily.)   metoprolol  succinate (TOPROL  XL) 25 MG 24 hr tablet Take 1 tablet (25 mg total) by mouth 2 (two) times daily.   metoprolol  tartrate (LOPRESSOR ) 25 MG tablet Take 25 mg by mouth daily as needed (palpitations).   nitroGLYCERIN  (NITROSTAT ) 0.4 MG SL tablet DISSOLVE 1 TABLET UNDER TONGUE EVERY 5 MINUTES UP TO 15 MIN FOR CHESTPAIN. IF NO RELIEF CALL 911.   ONE TOUCH ULTRA TEST test strip TEST BLOOD SUGAR UP TO 4 TIMES DAILY.   ONETOUCH DELICA LANCETS 33G MISC USE AS DIRECTED TO TEST BLOOD SUGAR 4 TIMES DAILY.   pravastatin  (PRAVACHOL ) 40 MG tablet TAKE 1 TABLET BY MOUTH ONCE DAILY IN THE EVENING   rOPINIRole  (REQUIP  XL) 4 MG 24 hr tablet TAKE 1 TABLET BY MOUTH AT BEDTIME   rOPINIRole  (REQUIP ) 4 MG tablet Take 1/2 (one-half) tablet by mouth twice daily   Semaglutide  (OZEMPIC , 2 MG/DOSE, Cable) Inject into the skin.   torsemide  (DEMADEX ) 20 MG tablet Take 2 tablets (40 mg total) by mouth 2 (two) times daily.   [DISCONTINUED] levothyroxine  (SYNTHROID ) 137 MCG tablet Take 1 tablet (137 mcg total) by mouth daily before breakfast.   [DISCONTINUED] Semaglutide , 1 MG/DOSE, 4 MG/3ML SOPN Inject 1 mg into the skin once a week. (Patient taking differently: Inject 2 mg into the skin once a week.)   ezetimibe  (ZETIA ) 10 MG tablet Take 10 mg by mouth daily.   FIASP  FLEXTOUCH 100 UNIT/ML FlexTouch Pen Inject 30-36 Units into the skin in the morning, at noon, and at bedtime.   fluticasone  (FLONASE ) 50 MCG/ACT nasal spray Place 1 spray into both nostrils daily. (Patient taking differently: Place 1 spray into both nostrils daily as needed.)   Fluticasone -Umeclidin-Vilant (TRELEGY ELLIPTA ) 100-62.5-25 MCG/ACT AEPB Inhale 1 puff into the lungs daily.   Glucagon  (GVOKE HYPOPEN  2-PACK) 1 MG/0.2ML SOAJ Inject 1 mg subcutaneously  once as needed for low blood sugar. May repeat dose in 15 minutes as needed using a new  device. (Patient taking differently: 2 mg. Inject 2 mg subcutaneously once as needed for low blood sugar. May repeat dose in 15 minutes as needed using a new device.)   levothyroxine  (SYNTHROID ) 137 MCG tablet Take 1 tablet (137 mcg total) by mouth daily before breakfast.   modafinil  (PROVIGIL ) 200 MG tablet Take 1 tablet (200 mg total) by mouth daily. (Patient not taking: Reported on 08/09/2023)   [DISCONTINUED] metoprolol  succinate (TOPROL  XL) 25 MG 24 hr tablet Take 1 tablet (25 mg total) by mouth 2 (two) times daily.   No facility-administered encounter medications on file as of 08/09/2023.   ALLERGIES: Allergies  Allergen Reactions   Tape Rash    Pulls off skin   Farxiga [Dapagliflozin]     Yeast infections, felt sick with it   Cardizem  [Diltiazem  Hcl]     Edema    Cardura [Doxazosin Mesylate]     Headaches / cramps   Crestor  [Rosuvastatin ] Other (See Comments)    Leg cramps   Lipitor [Atorvastatin ] Other (See Comments)    Leg cramps   Paxil [Paroxetine Hcl]     Unknown reaction    Codeine Rash and Other (See Comments)    Headache    Gabapentin Rash   VACCINATION STATUS: Immunization History  Administered Date(s) Administered   Fluad Quad(high Dose 65+) 11/03/2021   Fluad Trivalent(High Dose 65+) 09/20/2022   Influenza Split 09/18/2012   Influenza,inj,Quad PF,6+ Mos 10/13/2013, 10/06/2014, 09/11/2018   Influenza-Unspecified 12/01/2015, 11/08/2016, 11/02/2020   PFIZER(Purple Top)SARS-COV-2 Vaccination 03/06/2019, 03/27/2019   PNEUMOCOCCAL CONJUGATE-20 02/24/2022   Pneumococcal Polysaccharide-23 10/02/2004, 10/03/2011   Td 05/24/2006    Diabetes He presents for his follow-up diabetic visit. He has type 2 diabetes mellitus. Onset time: Was diagnosed at approximate age of 46 years. His disease course has been worsening. There are no hypoglycemic associated symptoms. Pertinent negatives for hypoglycemia include no confusion, nervousness/anxiousness, pallor, seizures or tremors.  Associated symptoms include fatigue and foot paresthesias. Pertinent negatives for diabetes include no polydipsia, no polyphagia, no polyuria, no weakness and no weight loss. There are no hypoglycemic complications. Symptoms are stable. Diabetic complications include heart disease, nephropathy and peripheral neuropathy. (Has had 2 mini strokes ) Risk factors for coronary artery disease include diabetes mellitus, dyslipidemia, male sex, obesity, sedentary lifestyle, tobacco exposure and hypertension. Current diabetic treatment includes intensive insulin  program (and Ozempic ). He is compliant with treatment most of the time. His weight is fluctuating minimally. He is following a generally healthy diet. When asked about meal planning, he reported none. He has had a previous visit with a dietitian. He never participates in exercise. His home blood glucose trend is decreasing steadily. His overall blood glucose range is >200 mg/dl. (He presents today with his CGM showing mostly at target glycemic profile overall.  His POCT A1c today was 8.1%, improving from last visit of 8.5%.  Analysis of his CGM shows TIR 53%, TAR 45%, TBR 2% with a GMI of 7.5%.  He has had heart cath since last visit and had trouble with kidneys as well.  ) An ACE inhibitor/angiotensin II receptor blocker is being taken. He sees a podiatrist.Eye exam is current.  Thyroid  Problem Presents for follow-up visit. Symptoms include fatigue and leg swelling. Patient reports no anxiety, cold intolerance, constipation, depressed mood, diarrhea, heat intolerance, tremors, weight gain or weight loss. The symptoms have been stable.     Review of  systems  Constitutional: + increasing body weight,  current Body mass index is 38.9 kg/m. , no fatigue, no subjective hyperthermia, no subjective hypothermia, reports mild memory impairment since mini strokes Eyes: no blurry vision, no xerophthalmia ENT: no sore throat, no nodules palpated in throat, no  dysphagia/odynophagia, no hoarseness Cardiovascular: no chest pain, no shortness of breath, no palpitations, no edema Respiratory: no cough, no shortness of breath Gastrointestinal: no nausea/vomiting/diarrhea Musculoskeletal: no muscle/joint aches Skin: no rashes, no hyperemia Neurological: no tremors, no numbness, no tingling, no dizziness, + intermittent numbness/tingling to bilateral feet Psychiatric: no depression, no anxiety    Objective:    BP 128/66 (BP Location: Right Arm, Patient Position: Sitting, Cuff Size: Large)   Pulse 80   Ht 5' 4 (1.626 m)   Wt 226 lb 9.6 oz (102.8 kg)   BMI 38.90 kg/m   Wt Readings from Last 3 Encounters:  08/09/23 226 lb 9.6 oz (102.8 kg)  07/02/23 217 lb (98.4 kg)  06/26/23 232 lb (105.2 kg)     BP Readings from Last 3 Encounters:  08/09/23 128/66  07/02/23 110/62  06/26/23 124/65      Physical Exam- Limited  Constitutional:  Body mass index is 38.9 kg/m. , not in acute distress, normal state of mind Eyes:  EOMI, no exophthalmos Musculoskeletal: no gross deformities, strength intact in all four extremities, no gross restriction of joint movements Skin:  no rashes, no hyperemia Neurological: no tremor with outstretched hands   Diabetic Foot Exam - Simple   No data filed        Latest Ref Rng & Units 08/03/2023    9:34 AM 07/20/2023   11:01 AM 07/13/2023   10:50 AM  CMP  Glucose 70 - 99 mg/dL 781  95  817   BUN 8 - 27 mg/dL 33  67  60   Creatinine 0.76 - 1.27 mg/dL 8.64  7.95  8.23   Sodium 134 - 144 mmol/L 137  138  140   Potassium 3.5 - 5.2 mmol/L 5.1  4.9  5.3   Chloride 96 - 106 mmol/L 104  98  99   CO2 20 - 29 mmol/L 19  23  22    Calcium  8.6 - 10.2 mg/dL 8.8  9.2  9.6      Diabetic Labs (most recent): Lab Results  Component Value Date   HGBA1C 8.1 (A) 08/09/2023   HGBA1C 8.5 (A) 04/09/2023   HGBA1C 7.8 (A) 12/08/2022   MICROALBUR 10 06/09/2021   MICROALBUR 9.5 02/06/2019   MICROALBUR 4.6 10/08/2017   Lipid  Panel     Component Value Date/Time   CHOL 126 04/26/2023 0845   TRIG 281 (H) 04/26/2023 0845   HDL 36 (L) 04/26/2023 0845   CHOLHDL 3.5 04/26/2023 0845   CHOLHDL 3.2 08/05/2019 0334   VLDL 19 08/05/2019 0334   LDLCALC 47 04/26/2023 0845   LDLCALC 118 (H) 02/06/2019 0813     Assessment & Plan:   1) Uncontrolled type 2 diabetes mellitus with circulatory complication, with long-term current use of insulin  (HCC)  -His diabetes is complicated by coronary artery disease and patient remains at an extremely high risk for more acute and chronic complications of diabetes which include CAD, CVA, CKD, retinopathy, and neuropathy. These are all discussed in detail with the patient.  He presents today with his CGM showing mostly at target glycemic profile overall.  His POCT A1c today was 8.1%, improving from last visit of 8.5%.  Analysis of his CGM shows  TIR 53%, TAR 45%, TBR 2% with a GMI of 7.5%.  He has had heart cath since last visit and had trouble with kidneys as well.       Glucose logs and insulin  administration records pertaining to this visit,  to be scanned into patient's records.  Recent labs reviewed.  - Nutritional counseling repeated at each appointment due to patients tendency to fall back in to old habits.  - The patient admits there is a room for improvement in their diet and drink choices. -  Suggestion is made for the patient to avoid simple carbohydrates from their diet including Cakes, Sweet Desserts / Pastries, Ice Cream, Soda (diet and regular), Sweet Tea, Candies, Chips, Cookies, Sweet Pastries, Store Bought Juices, Alcohol  in Excess of 1-2 drinks a day, Artificial Sweeteners, Coffee Creamer, and Sugar-free Products. This will help patient to have stable blood glucose profile and potentially avoid unintended weight gain.   - I encouraged the patient to switch to unprocessed or minimally processed complex starch and increased protein intake (animal or plant source), fruits,  and vegetables.   - Patient is advised to stick to a routine mealtimes to eat 3 meals a day and avoid unnecessary snacks (to snack only to correct hypoglycemia).  - I have approached patient with the following individualized plan to manage diabetes and patient agrees.  -He is advised to decrease his Tresiba  to 110 units SQ nightly, continue Fiasp  34-40 units SQ TID with meals if glucose is above 90 and he is eating (Specific instructions on how to titrate insulin  dosage based on glucose readings given to patient in writing).  Will also continue his Ozempic  2 mg SQ weekly as well.  Will send in refill form to [facility].  -He is encouraged to continue using his CGM to monitor glucose 4 times daily, before meals and before bed, and to call the clinic if he has readings less than 70 or greater than 300 for 3 tests in a row.  2) BP/HTN:  His blood pressure is controlled to target for his age.  He is advised to continue Lasix  40 mg po daily, Losartan  50 mg po daily, Norvasc  5 mg po daily, and Metoprolol  75 mg po twice daily.  Will defer changes to cardiology.  3) Lipids/HPL:  His most recent lipid panel from 05/05/22 shows controlled LDL of 73and elevated triglycerides of 206.  He is advised to continue Pravastatin  40 mg po daily at bedtime.  Side effects and precautions discussed with him.    4)  Weight/Diet:  His Body mass index is 38.9 kg/m.-complicating his diabetes care.  He is a candidate for modest weight loss.  CDE consult in progress, exercise, and carbohydrates information provided.  5) Hypothyroidism -There are no recent TFTs to review.  He is advised to continue current dose of Levothyroxine  at 137 mcg po daily before breakfast.  Will recheck TFTs prior to next visit and adjust dose accordingly.   - We discussed about the correct intake of his thyroid  hormone, on empty stomach at fasting, with water, separated by at least 30 minutes from breakfast and other medications,  and separated by more  than 4 hours from calcium , iron , multivitamins, acid reflux medications (PPIs). -Patient is made aware of the fact that thyroid  hormone replacement is needed for life, dose to be adjusted by periodic monitoring of thyroid  function tests.  6) Chronic Care/Health Maintenance: -Patient is on ACEI/ARB and Statin medications and encouraged to continue to follow up with Ophthalmology, Podiatrist  at least yearly or according to recommendations, and advised to  stay away from smoking. I have recommended yearly flu vaccine and pneumonia vaccination at least every 5 years; moderate intensity exercise for up to 150 minutes weekly; and  sleep for at least 7 hours a day.  - I advised patient to maintain close follow up with Alphonsa Glendia LABOR, MD for primary care needs.     I spent  35  minutes in the care of the patient today including review of labs from CMP, Lipids, Thyroid  Function, Hematology (current and previous including abstractions from other facilities); face-to-face time discussing  his blood glucose readings/logs, discussing hypoglycemia and hyperglycemia episodes and symptoms, medications doses, his options of short and long term treatment based on the latest standards of care / guidelines;  discussion about incorporating lifestyle medicine;  and documenting the encounter. Risk reduction counseling performed per USPSTF guidelines to reduce obesity and cardiovascular risk factors.     Please refer to Patient Instructions for Blood Glucose Monitoring and Insulin /Medications Dosing Guide  in media tab for additional information. Please  also refer to  Patient Self Inventory in the Media  tab for reviewed elements of pertinent patient history.  Paul Baker participated in the discussions, expressed understanding, and voiced agreement with the above plans.  All questions were answered to his satisfaction. he is encouraged to contact clinic should he have any questions or concerns prior to his  return visit.   Follow up plan: -Return in about 4 months (around 12/09/2023) for Diabetes F/U with A1c in office, No previsit labs, Bring meter and logs.  Benton Rio, [facility] [address] [place], [place] [address] Phone: [phone] Fax: [phone]  08/09/2023, 9:57 AM

## 2023-08-14 ENCOUNTER — Encounter: Payer: Self-pay | Admitting: Adult Health

## 2023-08-14 ENCOUNTER — Ambulatory Visit: Admitting: Adult Health

## 2023-08-14 ENCOUNTER — Telehealth: Payer: Self-pay | Admitting: *Deleted

## 2023-08-14 VITALS — BP 140/58 | HR 87 | Temp 97.7°F | Ht 64.0 in | Wt 225.6 lb

## 2023-08-14 DIAGNOSIS — Z23 Encounter for immunization: Secondary | ICD-10-CM | POA: Diagnosis not present

## 2023-08-14 DIAGNOSIS — J4550 Severe persistent asthma, uncomplicated: Secondary | ICD-10-CM

## 2023-08-14 DIAGNOSIS — Z6838 Body mass index (BMI) 38.0-38.9, adult: Secondary | ICD-10-CM

## 2023-08-14 DIAGNOSIS — Z87891 Personal history of nicotine dependence: Secondary | ICD-10-CM

## 2023-08-14 DIAGNOSIS — G4733 Obstructive sleep apnea (adult) (pediatric): Secondary | ICD-10-CM | POA: Diagnosis not present

## 2023-08-14 DIAGNOSIS — I5032 Chronic diastolic (congestive) heart failure: Secondary | ICD-10-CM

## 2023-08-14 MED ORDER — TRELEGY ELLIPTA 100-62.5-25 MCG/ACT IN AEPB: 1.0000 | INHALATION_SPRAY | Freq: Every day | RESPIRATORY_TRACT | 3 refills | Status: AC

## 2023-08-14 NOTE — Patient Instructions (Addendum)
 Liquid Mucinex  DM Twice daily  As needed  Cough and congestion  Tessalon  Three times a day  As needed  cough  Continue on Trelegy 1 puff daily  Albuterol  inhaler or Neb As needed   Activity as tolerated  Continue on CPAP At bedtime.  Continue on Torsemide  as directed by Cards.  TDAP vaccine today .  Follow up in 6 months with Dr. Neda or Krishawn Vanderweele NP

## 2023-08-14 NOTE — Telephone Encounter (Signed)
 Prescription for Trelegy was faxed to [facility], received confirmation fax that it was sent successfully.  Nothing further needed.

## 2023-08-14 NOTE — Progress Notes (Signed)
 @Patient  ID: Paul Baker, male    DOB: 07/25/1950, 73 y.o.   MRN: [id number]  Chief Complaint  Patient presents with   Asthma    Referring provider: Alphonsa Glendia LABOR, MD  HPI: 73 year old male former smoker followed for severe persistent asthma Medical significant for obstructive sleep apnea on CPAP and restless leg syndrome followed by [facility] neurology. History of coronary artery disease, fatty liver disease, right subclavian vein stenosis, spinal stenosis, DVT, psoriasis, septic arthritis in the left knee, diabetes, A-fib on Eliquis . Diabetes , CKD   TEST/EVENTS :  Spirometry 10/17/11 >> 2.58 (89%), FEV1% 85 PFT 09/11/18 >> FEV1 2.19 (79%), FEV1% 82, TLC 4.48 (74%), DLCO 70%, +BD from change in FEF 25-75% 09/23/20 PFT normal FEV1 87%, ratio 86, FVC 74%, no significant bronchodilator response, DLCO 94%   HRCT chest 12/19/18 >> atherosclerosis, patchy air trapping, fatty liver, neg ILD    Chest x-ray February 2022 no acute pulmonary disease   PSG 2012 >> AHI 60   LHC 01/17/18 >> non obstructive CAD Echo 02/21/18 >> EF 55 to 60%  08/14/2023 Follow up : Asthma  Discussed the use of AI scribe software for clinical note transcription with the patient, who gave verbal consent to proceed.  History of Present Illness Paul Baker is a 73 year old male presents for a follow up for asthma . Last visit was having slow to resolve asthmatic bronchitis after treatment for RLL pneumonia.  Initial chest x-ray showed right basilar infiltrate.  Patient was treated with a Z-Pak and Augmentin .  He continued to have ongoing symptoms and was given a doxycycline  course for 1 week.  Follow-up chest x-ray on June 14, 2023 showed resolution of pneumonia.  Last visit patient was having ongoing symptoms of intermittent cough.  Patient was recommended on cough control with regimen.  Since last visit patient says he is feeling better. He has experienced a decrease in cough and improved breathing,  although some shortness of breath persists. He continues to use Trelegy.  He recently underwent a heart catheterization on July 02, 2023 That showed stable atherosclerosis and coronary artery disease.  Mildly to moderately elevated left heart and pulmonary artery pressures.  Severely elevated right heart filling pressures.  Patient was recommended to increase torsemide  up to 60 mg twice daily.  Patient said his kidney function declined significantly and now is on torsemide  40 mg twice daily.  Most recent lab work on August 03, 2023 showed serum creatinine had improved at 1.3  He has sleep apnea, which is currently managed at [facility], although he is considering switching management due to difficulty in scheduling appointments.  Remains on CPAP at bedtime.  He receives flu and pneumonia vaccines regularly.  Is due for his Tdap.    Allergies  Allergen Reactions   Tape Rash    Pulls off skin   Farxiga [Dapagliflozin]     Yeast infections, felt sick with it   Cardizem  [Diltiazem  Hcl]     Edema    Cardura [Doxazosin Mesylate]     Headaches / cramps   Crestor  [Rosuvastatin ] Other (See Comments)    Leg cramps   Lipitor [Atorvastatin ] Other (See Comments)    Leg cramps   Paxil [Paroxetine Hcl]     Unknown reaction    Codeine Rash and Other (See Comments)    Headache    Gabapentin Rash    Immunization History  Administered Date(s) Administered   Fluad Quad(high Dose 65+) 11/03/2021   Fluad Trivalent(High  Dose 65+) 09/20/2022   Influenza Split 09/18/2012   Influenza,inj,Quad PF,6+ Mos 10/13/2013, 10/06/2014, 09/11/2018   Influenza-Unspecified 12/01/2015, 11/08/2016, 11/02/2020   PFIZER(Purple Top)SARS-COV-2 Vaccination 03/06/2019, 03/27/2019   PNEUMOCOCCAL CONJUGATE-20 02/24/2022   Pneumococcal Polysaccharide-23 10/02/2004, 10/03/2011   Td 05/24/2006    Past Medical History:  Diagnosis Date   Arthritis    Asthma    Atrial fibrillation [facility])    Diagnosed December 2021    Colon polyp    Coronary atherosclerosis of native coronary artery    a. 2011: cath showing 90% stenosis along small non-dominant RCA (too small for PCI). b. 01/2018: cath showing nonobstructive CAD with 60 to 70% proximal to mid nondominant RCA stenosis, 50% mid LAD and 40 to 50% OM1   DVT (deep venous thrombosis) (HCC) 2005   Right arm   Essential hypertension    Headache    History of transfusion    Hypothyroidism    Mixed hyperlipidemia    Morbid obesity (HCC)    MRSA (methicillin resistant staph aureus) culture positive    08/2012   Narcolepsy    OSA (obstructive sleep apnea)    CPAP   Osteoarthritis    Pneumonia    Psoriasis    Pulmonary embolism (HCC) 2004   RLS (restless legs syndrome)    Rotator cuff disorder    Left   Septic arthritis of knee, left (HCC)    Skin cancer, basal cell    Type 2 diabetes mellitus (HCC)     Tobacco History: Social History   Tobacco Use  Smoking Status Former   Current packs/day: 0.00   Average packs/day: 1.5 packs/day for 27.5 years (41.3 ttl pk-yrs)   Types: Cigarettes   Start date: 06/19/1967   Quit date: 01/03/1995   Years since quitting: 28.6   Passive exposure: Never  Smokeless Tobacco Never   Counseling given: Not Answered   Outpatient Medications Prior to Visit  Medication Sig Dispense Refill   acetaminophen  (TYLENOL ) 325 MG tablet Take 2 tablets (650 mg total) by mouth every 6 (six) hours as needed for mild pain (or Fever >/= 101). 30 tablet 1   albuterol  (PROVENTIL ) (2.5 MG/3ML) 0.083% nebulizer solution Take 3 mLs (2.5 mg total) by nebulization every 6 (six) hours as needed for up to 7 days for wheezing or shortness of breath. 84 mL 3   albuterol  (VENTOLIN  HFA) 108 (90 Base) MCG/ACT inhaler Inhale 2 puffs into the lungs every 6 (six) hours as needed for wheezing or shortness of breath. 18 g 5   alfuzosin  (UROXATRAL ) 10 MG 24 hr tablet Take 10 mg by mouth daily.     Alpha Lipoic Acid -Biotin  ER 600-450 MG-MCG TBCR Take 600  mg by mouth every morning.     benzonatate  (TESSALON ) 200 MG capsule Take 1 capsule (200 mg total) by mouth 3 (three) times daily as needed. 45 capsule 1   clobetasol  cream (TEMOVATE ) 0.05 % Apply thin amount 2 times daily prn to psoriasis not for face axilla or groin 45 g 1   Continuous Glucose Sensor (DEXCOM G7 SENSOR) MISC Change sensor every 10 days 3 each 3   Cyanocobalamin  (VITAMIN B 12 PO) Take 5,000 mcg by mouth daily.     dabigatran  (PRADAXA ) 150 MG CAPS capsule Take 1 capsule (150 mg total) by mouth 2 (two) times daily. 60 capsule 11   desonide  (DESOWEN ) 0.05 % cream Apply once daily prn to facial rash 30 g 0   dextromethorphan -guaiFENesin  (MUCINEX  DM) 30-600 MG 12hr tablet Take 1  tablet by mouth 2 (two) times daily. (Patient taking differently: Take 1 tablet by mouth as needed for cough.) 20 tablet 0   ezetimibe  (ZETIA ) 10 MG tablet Take 10 mg by mouth daily.     FIASP  FLEXTOUCH 100 UNIT/ML FlexTouch Pen Inject 30-36 Units into the skin in the morning, at noon, and at bedtime.     fluticasone  (FLONASE ) 50 MCG/ACT nasal spray Place 1 spray into both nostrils daily. (Patient taking differently: Place 1 spray into both nostrils daily as needed.) 16 g 2   Fluticasone -Umeclidin-Vilant (TRELEGY ELLIPTA ) 100-62.5-25 MCG/ACT AEPB Inhale 1 puff into the lungs daily. 3 each 3   Glucagon  (GVOKE HYPOPEN  2-PACK) 1 MG/0.2ML SOAJ Inject 1 mg subcutaneously once as needed for low blood sugar. May repeat dose in 15 minutes as needed using a new device. (Patient taking differently: 2 mg. Inject 2 mg subcutaneously once as needed for low blood sugar. May repeat dose in 15 minutes as needed using a new device.) 0.4 mL 1   insulin  degludec (TRESIBA  FLEXTOUCH) 200 UNIT/ML FlexTouch Pen Inject 120 Units into the skin at bedtime.     Insulin  Pen Needle (B-D ULTRAFINE III SHORT PEN) 31G X 8 MM MISC USE 1 PEN NEEDLE THREE TIMES DAILY 100 each 2   isosorbide  mononitrate (IMDUR ) 30 MG 24 hr tablet Take 1 tablet (30 mg  total) by mouth 2 (two) times daily. (Patient taking differently: Take 30 mg by mouth daily.) 180 tablet 3   levothyroxine  (SYNTHROID ) 137 MCG tablet Take 1 tablet (137 mcg total) by mouth daily before breakfast. 90 tablet 3   loratadine  (CLARITIN ) 10 MG tablet Take 10 mg by mouth daily as needed for allergies.      losartan  (COZAAR ) 50 MG tablet Take 50 mg by mouth daily.     magnesium  30 MG tablet Take 30 mg by mouth daily. (Patient taking differently: Take 400 mg by mouth daily.)     metoprolol  succinate (TOPROL  XL) 25 MG 24 hr tablet Take 1 tablet (25 mg total) by mouth 2 (two) times daily. 270 tablet 3   metoprolol  tartrate (LOPRESSOR ) 25 MG tablet Take 25 mg by mouth daily as needed (palpitations).     modafinil  (PROVIGIL ) 200 MG tablet Take 1 tablet (200 mg total) by mouth daily. 30 tablet 5   nitroGLYCERIN  (NITROSTAT ) 0.4 MG SL tablet DISSOLVE 1 TABLET UNDER TONGUE EVERY 5 MINUTES UP TO 15 MIN FOR CHESTPAIN. IF NO RELIEF CALL 911. 25 tablet 3   ONE TOUCH ULTRA TEST test strip TEST BLOOD SUGAR UP TO 4 TIMES DAILY. 150 each 5   ONETOUCH DELICA LANCETS 33G MISC USE AS DIRECTED TO TEST BLOOD SUGAR 4 TIMES DAILY. 150 each 5   pravastatin  (PRAVACHOL ) 40 MG tablet TAKE 1 TABLET BY MOUTH ONCE DAILY IN THE EVENING 90 tablet 2   rOPINIRole  (REQUIP  XL) 4 MG 24 hr tablet TAKE 1 TABLET BY MOUTH AT BEDTIME 90 tablet 1   rOPINIRole  (REQUIP ) 4 MG tablet Take 1/2 (one-half) tablet by mouth twice daily 90 tablet 0   Semaglutide  (OZEMPIC , 2 MG/DOSE, [place]) Inject into the skin.     torsemide  (DEMADEX ) 20 MG tablet Take 2 tablets (40 mg total) by mouth 2 (two) times daily.     No facility-administered medications prior to visit.     Review of Systems:   Constitutional:   No  weight loss, night sweats,  Fevers, chills, +fatigue, or  lassitude.  HEENT:   No headaches,  Difficulty swallowing,  Tooth/dental  problems, or  Sore throat,                No sneezing, itching, ear ache, nasal congestion, post nasal  drip,   CV:  No chest pain,  Orthopnea, PND, +swelling in lower extremities, no anasarca, dizziness, palpitations, syncope.   GI  No heartburn, indigestion, abdominal pain, nausea, vomiting, diarrhea, change in bowel habits, loss of appetite, bloody stools.   Resp:  No excess mucus, no productive cough,  No non-productive cough,  No coughing up of blood.  No change in color of mucus.  No wheezing.  No chest wall deformity  Skin: no rash or lesions.  GU: no dysuria, change in color of urine, no urgency or frequency.  No flank pain, no hematuria   MS:  No joint pain or swelling.  No decreased range of motion.  No back pain.    Physical Exam  BP (!) 140/58   Pulse 87   Temp 97.7 F (36.5 C) (Oral)   Ht 5' 4 (1.626 m)   Wt 225 lb 9.6 oz (102.3 kg)   SpO2 98% Comment: RA  BMI 38.72 kg/m   GEN: A/Ox3; pleasant , NAD, well nourished    HEENT:  [place]/AT,   NOSE-clear, THROAT-clear, no lesions, no postnasal drip or exudate noted.   NECK:  Supple w/ fair ROM; no JVD; normal carotid impulses w/o bruits; no thyromegaly or nodules palpated; no lymphadenopathy.    RESP  Clear  P & A; w/o, wheezes/ rales/ or rhonchi. no accessory muscle use, no dullness to percussion  CARD:  RRR, no m/r/g, tr -1  peripheral edema, pulses intact, no cyanosis or clubbing.  GI:   Soft & nt; nml bowel sounds; no organomegaly or masses detected.   Musco: Warm bil, no deformities or joint swelling noted.   Neuro: alert, no focal deficits noted.    Skin: Warm, no lesions or rashes    Lab Results:  CBC    Component Value Date/Time   WBC 7.9 06/21/2023 1037   WBC 9.8 05/24/2023 0857   RBC 3.51 (L) 06/21/2023 1037   RBC 3.68 (L) 05/24/2023 0857   HGB 9.9 (L) 07/02/2023 1233   HGB 10.5 (L) 06/21/2023 1037   HCT 29.0 (L) 07/02/2023 1233   HCT 32.8 (L) 06/21/2023 1037   PLT 199 06/21/2023 1037   MCV 93 06/21/2023 1037   MCH 29.9 06/21/2023 1037   MCH 29.3 05/24/2023 0857   MCHC 32.0 06/21/2023 1037    MCHC 31.5 05/24/2023 0857   RDW 14.0 06/21/2023 1037   LYMPHSABS 1.5 05/24/2023 0857   LYMPHSABS 1.6 09/01/2021 1506   MONOABS 1.3 (H) 05/24/2023 0857   EOSABS 0.4 05/24/2023 0857   EOSABS 0.3 09/01/2021 1506   BASOSABS 0.1 05/24/2023 0857   BASOSABS 0.1 09/01/2021 1506    BMET    Component Value Date/Time   NA 137 08/03/2023 0934   K 5.1 08/03/2023 0934   CL 104 08/03/2023 0934   CO2 19 (L) 08/03/2023 0934   GLUCOSE 218 (H) 08/03/2023 0934   GLUCOSE 195 (H) 05/24/2023 0857   BUN 33 (H) 08/03/2023 0934   CREATININE 1.35 (H) 08/03/2023 0934   CREATININE 1.11 02/06/2019 0813   CALCIUM  8.8 08/03/2023 0934   GFRNONAA 59 (L) 05/24/2023 0857   GFRNONAA 68 02/06/2019 0813   GFRAA 87 12/04/2019 0832   GFRAA 79 02/06/2019 0813    BNP    Component Value Date/Time   BNP 20.5 06/05/2023 1227  BNP 81.0 07/11/2022 1032    ProBNP    Component Value Date/Time   PROBNP 100.0 05/21/2020 1054    Imaging: No results found.  Administration History     None          Latest Ref Rng & Units 09/20/2020    9:45 AM 11/21/2018    4:00 PM  PFT Results  FVC-Pre L 2.55  2.53   FVC-Predicted Pre % 69  68   FVC-Post L 2.72  2.68   FVC-Predicted Post % 74  72   Pre FEV1/FVC % % 84  81   Post FEV1/FCV % % 86  82   FEV1-Pre L 2.16  2.05   FEV1-Predicted Pre % 80  74   FEV1-Post L 2.34  2.19   DLCO uncorrected ml/min/mmHg 21.12  15.90   DLCO UNC% % 94  70   DLCO corrected ml/min/mmHg 21.12    DLCO COR %Predicted % 94    DLVA Predicted % 111  97   TLC L 4.37  4.48   TLC % Predicted % 72  74   RV % Predicted % 72  81     No results found for: NITRICOXIDE      Assessment & Plan:   No problem-specific Assessment & Plan notes found for this encounter.  Assessment and Plan Assessment & Plan Asthma -recent flare now resolved.  Continue Trelegy, Use albuterol  or nebulizer as needed and rinse mouth well after using Trelegy.  Heart failure  -improved on control on  current diuretic regimen .  Continue torsemide  40 mg twice daily and monitor weight daily. Follow up with cardiology as planned.   Chronic kidney disease   Kidney function has improved following adjustments in diuretic therapy, as shown by regular blood tests. Continue monitoring kidney function with regular blood tests with PCP and cards   Osa -continue on CPAP at bedtime.   Morbid obesity -healthy weight loss   TDAP vaccine today   Plan  Patient Instructions  Liquid Mucinex  DM Twice daily  As needed  Cough and congestion  Tessalon  Three times a day  As needed  cough  Continue on Trelegy 1 puff daily  Albuterol  inhaler or Neb As needed   Activity as tolerated  Continue on CPAP At bedtime.  Continue on Torsemide  as directed by Cards.  TDAP vaccine today .  Follow up in 6 months with Dr. Neda or Torin Whisner NP        Madelin Stank, NP 08/14/2023

## 2023-08-14 NOTE — Patient Instructions (Addendum)
 Liquid Mucinex  DM Twice daily  As needed  Cough and congestion  Tessalon  Three times a day  As needed  cough  Continue on Trelegy 1 puff daily  Albuterol  inhaler or Neb As needed   Activity as tolerated  Continue on CPAP At bedtime.  Continue on Torsemide  as directed by Cards.  TDAP vaccine today .  Follow up in 6 months with Dr. Neda or Teagan Heidrick NP

## 2023-08-15 NOTE — Progress Notes (Signed)
 SABRA

## 2023-08-16 ENCOUNTER — Ambulatory Visit: Admitting: Neurology

## 2023-08-16 ENCOUNTER — Encounter: Payer: Self-pay | Admitting: Neurology

## 2023-08-16 VITALS — BP 126/67 | HR 74 | Ht 64.0 in | Wt 226.0 lb

## 2023-08-16 DIAGNOSIS — G2581 Restless legs syndrome: Secondary | ICD-10-CM

## 2023-08-16 DIAGNOSIS — Z794 Long term (current) use of insulin: Secondary | ICD-10-CM

## 2023-08-16 DIAGNOSIS — R768 Other specified abnormal immunological findings in serum: Secondary | ICD-10-CM

## 2023-08-16 DIAGNOSIS — I480 Paroxysmal atrial fibrillation: Secondary | ICD-10-CM

## 2023-08-16 DIAGNOSIS — E1159 Type 2 diabetes mellitus with other circulatory complications: Secondary | ICD-10-CM

## 2023-08-16 DIAGNOSIS — I25119 Atherosclerotic heart disease of native coronary artery with unspecified angina pectoris: Secondary | ICD-10-CM | POA: Diagnosis not present

## 2023-08-16 DIAGNOSIS — J849 Interstitial pulmonary disease, unspecified: Secondary | ICD-10-CM

## 2023-08-16 DIAGNOSIS — M4803 Spinal stenosis, cervicothoracic region: Secondary | ICD-10-CM | POA: Diagnosis not present

## 2023-08-16 DIAGNOSIS — G4733 Obstructive sleep apnea (adult) (pediatric): Secondary | ICD-10-CM

## 2023-08-16 DIAGNOSIS — I5032 Chronic diastolic (congestive) heart failure: Secondary | ICD-10-CM

## 2023-08-16 DIAGNOSIS — Z96653 Presence of artificial knee joint, bilateral: Secondary | ICD-10-CM

## 2023-08-16 DIAGNOSIS — G253 Myoclonus: Secondary | ICD-10-CM

## 2023-08-16 DIAGNOSIS — E1142 Type 2 diabetes mellitus with diabetic polyneuropathy: Secondary | ICD-10-CM | POA: Diagnosis not present

## 2023-08-16 MED ORDER — ROPINIROLE HCL 4 MG PO TABS: 4.0000 mg | ORAL_TABLET | Freq: Every day | ORAL | 0 refills | Status: DC

## 2023-08-16 MED ORDER — ROPINIROLE HCL ER 4 MG PO TB24: 4.0000 mg | ORAL_TABLET | Freq: Every day | ORAL | 1 refills | Status: AC

## 2023-08-16 NOTE — Progress Notes (Signed)
 Yoniel Arkwright D, CMA  Joylene Carlean Sprung, Patricia; Ziegler, Melissa; Cain, Leveda Dollar, Dolanda New orders have been placed for the above pt, DOB: [birth year] Thanks

## 2023-08-16 NOTE — Patient Instructions (Signed)
 ASSESSMENT AND PLAN :    73 y.o. 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 Requip  is maxed out and doesn't help anymore.  Anemia persistent, r iron  deficiency, followed by oncology for abnormal myeloma panel.    2) OSA on CPAP.  Hypersomnia is right now very high-  Had to pause for pneumonia. Once back on CPAP he will do better -  OSA is well controlled. Its not the OSA that causes sleepiness.    3) DM and  severe neuropathy.   Feels improvement on magnesium  -  400 mg daily, nephrology unaware.   Alpha lipoic acid  in AM and pm po.  Continue. .      Plan: - opening the settings of CPAP.  10-15 cm water , 3 cm EPR.  If SOB, please use your rescue inhaler    Refilled Requip , cannot go higher.  4 mg XL at night and 4 mg IR in AM.  Spinal stenosis, neurogenic claudication. Diabetic polyneuropathy.

## 2023-08-16 NOTE — Progress Notes (Signed)
 Provider:  Dedra Gores, MD  Primary Care Physician:  Alphonsa Glendia LABOR, MD [address] [place] [address]     Referring Provider: Alphonsa Glendia LABOR, Md [address] [place],  [place] [address]          Chief Complaint according to patient   Patient presents with:                HISTORY OF PRESENT ILLNESS:  Paul Baker is a 73 y.o. male patient who is here for revisit 08/16/2023 for   several sleep disorders, RLS and OSA on CPAP. Possible anemia and neuropathy .  SABRA    Chief concern according to patient :   from February through June I had  bilateral pneumonia and couldn't stop coughing had to stop CPAP    atelectasis  and fluid around my heart - ( pericardial effusion )   a heart catheter was done and I was in heart failure and renal failure  ,  they found 2 blockages in my Coronary Arteries,  chronic diastolic failure and atrial fib. And : Moderate persistent asthma   Now on 80 mg torsemide  , followed by cardiology and Nephrology.  Meanwhile lungs have cleared.  Edema in feet persisted, dysphonia persisted.   He was seen on 08-14-2023 by pulmonology and dx with perssitent asthma.   HbA1 remains 8.1 (high)     Paul Baker is a 73 y.o. male patient who is here for revisit 05/04/2022 for  Sleep, RLS and headaches . Chief concern according to patient :  CPAP Patient in room #2. Patient state he still has pain and numbness in both legs. Patient states he is having RLS, anemia, but also numbness and pain in both hands and he's been having a lot of headaches.  Sometimes headaches involve the forehead towards the temples, bilaterally, starting  daily in id morning, and increasing as the day goes on.  There is another headache , one of shooting quality ,with a sudden and brief sensation  into the eye- stabbing sensation. Bp has been controlled, CPAP has worked for OSA.    He has still RLS.    That both hands have abnormal  sensations.  VitalHe also describes has injured his right wrist before and does have a steel plate which may explain some of the symptoms on the right hand he had no such injury or surgery to the left before he describes the finger buries as being numb and touch and temperature are not felt.  The palm feels normal, the fingers tingle.    Excessive daytime sleepiness: ESS at 11/ 24 points, FSS at 30/ 63 points.   RLS (restless legs syndrome), every night  Grade I diastolic dysfunction   Paroxysmal atrial fibrillation (HCC), last noted onset of atrial fib just last week, feeling an abnormal sensation ascending through the neck to the face and he voiced the following : feeling as if upper and lower teeth are needing to come out    OSA on CPAP- complaint user, 100% compliance by days and 24 days out of 30 used over 4 hours consecutively with an average use of time of 5 hours 17 minutes.  His CPAP is an AutoSet 11 set at 11 cm pressure with 3 cm EPR and a residual AHI of 0.9/h.  95th percentile air leak is 19 L, he does have no snoring breakthrough.  No Cheyne-Stokes respirations.  From his CPAP compliance data his apnea appears to be very well-controlled. His headgear becomes less elastic and allows more air leakage. He has facial hair, too.    Recently, after iron  testing he was found to have  abnormal protein e- phoresis results.  normocytic anemia and elevated serum free light chains. This may contribute to SOB, fatigue and contribute also to Neuropathy. Workup for anemia with SPEP-no M spike, immunofixation-normal, kappa light chains 35.7, lambda light chains 25.3, ratio 1.41.       He does have shortness of breath with minimal exertion. He has neuropathy in his fingertips and feet which is chronic in nature. He has diabetes for 25 years and has tingling in the hands and feet. Diabetic polyneuropathy associated with type 2 diabetes mellitus (HCC), reports low blood sugars at night. Status post  bilateral knee replacements, PVD, DVT, many small pulmonary emboli in 1982.      CAD :  Coronary atherosclerosis of native coronary artery          Review of Systems: Out of a complete 14 system review, the patient complains of only the following symptoms, and all other reviewed systems are negative.:   SLEEPINESS ?  How likely are you to doze in the following situations: 0 = not likely, 1 = slight chance, 2 = moderate chance, 3 = high chance  Sitting and Reading? Watching Television? Sitting inactive in a public place (theater or meeting)? Lying down in the afternoon when circumstances permit? Sitting and talking to someone? Sitting quietly after lunch without alcohol ? In a car, while stopped for a few minutes in traffic? As a passenger in a car for an hour without a break?  Total = 17  FSS 38 / 63        Social History   Socioeconomic History   Marital status: Married    Spouse name: Engineer, materials   Number of children: 2   Years of education: college   Highest education level: Not on file  Occupational History   Occupation: Disabled    Employer: UNEMPLOYED  Tobacco Use   Smoking status: Former    Current packs/day: 0.00    Average packs/day: 1.5 packs/day for 27.5 years (41.3 ttl pk-yrs)    Types: Cigarettes    Start date: 06/19/1967    Quit date: 01/03/1995    Years since quitting: 28.6    Passive exposure: Never   Smokeless tobacco: Never  Vaping Use   Vaping status: Never Used  Substance and Sexual Activity   Alcohol  use: No    Alcohol /week: 0.0 standard drinks of alcohol     Comment: quit drinking in 07/86   Drug use: No   Sexual activity: Yes    Partners: Female  Other Topics Concern   Not on file  Social History Narrative    73 year old, right-handed, caucasian male with a past medical history of obesity, hypertension, hyperlipidemia, diabetes, obstructive sleep apnea, presenting with frequent nighttime awakenings, excessive daytime sleepiness, also  transient confusional episodes.RLS and one beosity, OSA on CPAP with AHI of 3.2 and  setting of 16 cm water , [facility] .   Married since 1977.   Social Drivers of Corporate investment banker Strain: Low Risk  (05/05/2022)   Overall Financial Resource Strain (CARDIA)    Difficulty of Paying Living Expenses: Not hard at all  Food Insecurity: Patient Declined (07/12/2022)   Hunger Vital Sign    Worried About Running Out of Food in the Last Year: Patient declined  Ran Out of Food in the Last Year: Patient declined  Transportation Needs: No Transportation Needs (07/17/2022)   PRAPARE - Administrator, Civil Service (Medical): No    Lack of Transportation (Non-Medical): No  Physical Activity: Insufficiently Active (05/05/2022)   Exercise Vital Sign    Days of Exercise per Week: 3 days    Minutes of Exercise per Session: 30 min  Stress: No Stress Concern Present (05/05/2022)   [employer] of Occupational Health - Occupational Stress Questionnaire    Feeling of Stress : Not at all  Social Connections: Socially Integrated (05/05/2022)   Social Connection and Isolation Panel    Frequency of Communication with Friends and Family: More than three times a week    Frequency of Social Gatherings with Friends and Family: More than three times a week    Attends Religious Services: More than 4 times per year    Active Member of Clubs or Organizations: Yes    Attends Engineer, structural: More than 4 times per year    Marital Status: Married    Family History  Problem Relation Age of Onset   Hypertension Father    Heart attack Father    Kidney Stones Father    Seizures Grandchild    Narcolepsy Grandchild    Diabetes Sister     Past Medical History:  Diagnosis Date   Arthritis    Asthma    Atrial fibrillation [facility])    Diagnosed December 2021   Colon polyp    Coronary atherosclerosis of native coronary artery    a. 2011: cath showing 90% stenosis along small  non-dominant RCA (too small for PCI). b. 01/2018: cath showing nonobstructive CAD with 60 to 70% proximal to mid nondominant RCA stenosis, 50% mid LAD and 40 to 50% OM1   DVT (deep venous thrombosis) (HCC) 2005   Right arm   Essential hypertension    Headache    History of transfusion    Hypothyroidism    Mixed hyperlipidemia    Morbid obesity (HCC)    MRSA (methicillin resistant staph aureus) culture positive    08/2012   Narcolepsy    OSA (obstructive sleep apnea)    CPAP   Osteoarthritis    Pneumonia    Psoriasis    Pulmonary embolism (HCC) 2004   RLS (restless legs syndrome)    Rotator cuff disorder    Left   Septic arthritis of knee, left (HCC)    Skin cancer, basal cell    Type 2 diabetes mellitus (HCC)     Past Surgical History:  Procedure Laterality Date   BACK SURGERY     BIOPSY  01/31/2022   Procedure: BIOPSY;  Surgeon: Kriss Estefana DEL, DO;  Location: [facility];  Service: Gastroenterology;;   CATARACT EXTRACTION Bilateral    COLONOSCOPY     COLONOSCOPY WITH PROPOFOL  N/A 01/31/2022   Procedure: COLONOSCOPY WITH PROPOFOL ;  Surgeon: Kriss Estefana DEL, DO;  Location: [facility];  Service: Gastroenterology;  Laterality: N/A;   CYST REMOVAL TRUNK     from back   ESOPHAGOGASTRODUODENOSCOPY (EGD) WITH PROPOFOL  N/A 01/31/2022   Procedure: ESOPHAGOGASTRODUODENOSCOPY (EGD) WITH PROPOFOL ;  Surgeon: Kriss Estefana DEL, DO;  Location: [facility];  Service: Gastroenterology;  Laterality: N/A;   EYE SURGERY Left 2016   laser to left eye   HIP SURGERY     bone removed from both sides of hip   KNEE ARTHROTOMY Right 12/04/2014   Procedure: KNEE ARTHROTOMY PATELLA LIGAMENT RECONSTRUSION AND  REPAIR RIGHT KNEE;  Surgeon: Donnice Car, MD;  Location: [facility] OR;  Service: Orthopedics;  Laterality: Right;   KNEE SURGERY     X 25 TIMES   LEFT HEART CATH AND CORONARY ANGIOGRAPHY N/A 01/17/2018   Procedure: LEFT HEART CATH AND CORONARY ANGIOGRAPHY;  Surgeon: Claudene Victory ORN, MD;   Location: [facility];  Service: Cardiovascular;  Laterality: N/A;   LUMBAR DISC SURGERY     Left L3, L4, L5 discecotomy with decompression of L4 root   POLYPECTOMY  01/31/2022   Procedure: POLYPECTOMY;  Surgeon: Kriss Estefana DEL, DO;  Location: [facility];  Service: Gastroenterology;;   RIGHT/LEFT HEART CATH AND CORONARY ANGIOGRAPHY N/A 07/02/2023   Procedure: RIGHT/LEFT HEART CATH AND CORONARY ANGIOGRAPHY;  Surgeon: Mady Bruckner, MD;  Location: [facility];  Service: Cardiovascular;  Laterality: N/A;   TONSILLECTOMY     TOTAL KNEE ARTHROPLASTY  2003   LEFT   TOTAL KNEE ARTHROPLASTY Right 03/23/2014   Procedure: RIGHT TOTAL KNEE ARTHROPLASTY AND REMOVAL RIGHT TIBIAL  DEEP IMPLANT STAPLE;  Surgeon: Donnice Car, MD;  Location: [facility];  Service: Orthopedics;  Laterality: Right;   TOTAL KNEE REVISION  2005   LEFT   WRIST SURGERY       Current Outpatient Medications on File Prior to Visit  Medication Sig Dispense Refill   acetaminophen  (TYLENOL ) 325 MG tablet Take 2 tablets (650 mg total) by mouth every 6 (six) hours as needed for mild pain (or Fever >/= 101). 30 tablet 1   albuterol  (PROVENTIL ) (2.5 MG/3ML) 0.083% nebulizer solution Take 3 mLs (2.5 mg total) by nebulization every 6 (six) hours as needed for up to 7 days for wheezing or shortness of breath. 84 mL 3   albuterol  (VENTOLIN  HFA) 108 (90 Base) MCG/ACT inhaler Inhale 2 puffs into the lungs every 6 (six) hours as needed for wheezing or shortness of breath. 18 g 5   alfuzosin  (UROXATRAL ) 10 MG 24 hr tablet Take 10 mg by mouth daily.     Alpha Lipoic Acid -Biotin  ER 600-450 MG-MCG TBCR Take 600 mg by mouth every morning.     benzonatate  (TESSALON ) 200 MG capsule Take 1 capsule (200 mg total) by mouth 3 (three) times daily as needed. 45 capsule 1   clobetasol  cream (TEMOVATE ) 0.05 % Apply thin amount 2 times daily prn to psoriasis not for face axilla or groin 45 g 1   Continuous Glucose Sensor (DEXCOM G7 SENSOR) MISC  Change sensor every 10 days 3 each 3   Cyanocobalamin  (VITAMIN B 12 PO) Take 5,000 mcg by mouth daily.     dabigatran  (PRADAXA ) 150 MG CAPS capsule Take 1 capsule (150 mg total) by mouth 2 (two) times daily. 60 capsule 11   desonide  (DESOWEN ) 0.05 % cream Apply once daily prn to facial rash 30 g 0   dextromethorphan -guaiFENesin  (MUCINEX  DM) 30-600 MG 12hr tablet Take 1 tablet by mouth 2 (two) times daily. (Patient taking differently: Take 1 tablet by mouth as needed for cough.) 20 tablet 0   ezetimibe  (ZETIA ) 10 MG tablet Take 10 mg by mouth daily.     FIASP  FLEXTOUCH 100 UNIT/ML FlexTouch Pen Inject 30-36 Units into the skin in the morning, at noon, and at bedtime.     fluticasone  (FLONASE ) 50 MCG/ACT nasal spray Place 1 spray into both nostrils daily. (Patient taking differently: Place 1 spray into both nostrils daily as needed.) 16 g 2   Fluticasone -Umeclidin-Vilant (TRELEGY ELLIPTA ) 100-62.5-25 MCG/ACT AEPB Inhale 1 puff into the  lungs daily. 3 each 3   Glucagon  (GVOKE HYPOPEN  2-PACK) 1 MG/0.2ML SOAJ Inject 1 mg subcutaneously once as needed for low blood sugar. May repeat dose in 15 minutes as needed using a new device. (Patient taking differently: 2 mg. Inject 2 mg subcutaneously once as needed for low blood sugar. May repeat dose in 15 minutes as needed using a new device.) 0.4 mL 1   insulin  degludec (TRESIBA  FLEXTOUCH) 200 UNIT/ML FlexTouch Pen Inject 120 Units into the skin at bedtime.     Insulin  Pen Needle (B-D ULTRAFINE III SHORT PEN) 31G X 8 MM MISC USE 1 PEN NEEDLE THREE TIMES DAILY 100 each 2   isosorbide  mononitrate (IMDUR ) 30 MG 24 hr tablet Take 1 tablet (30 mg total) by mouth 2 (two) times daily. (Patient taking differently: Take 30 mg by mouth daily.) 180 tablet 3   levothyroxine  (SYNTHROID ) 137 MCG tablet Take 1 tablet (137 mcg total) by mouth daily before breakfast. 90 tablet 3   loratadine  (CLARITIN ) 10 MG tablet Take 10 mg by mouth daily as needed for allergies.      losartan   (COZAAR ) 50 MG tablet Take 50 mg by mouth daily.     magnesium  30 MG tablet Take 30 mg by mouth daily. (Patient taking differently: Take 400 mg by mouth daily.)     metoprolol  succinate (TOPROL  XL) 25 MG 24 hr tablet Take 1 tablet (25 mg total) by mouth 2 (two) times daily. 270 tablet 3   metoprolol  tartrate (LOPRESSOR ) 25 MG tablet Take 25 mg by mouth daily as needed (palpitations).     modafinil  (PROVIGIL ) 200 MG tablet Take 1 tablet (200 mg total) by mouth daily. 30 tablet 5   nitroGLYCERIN  (NITROSTAT ) 0.4 MG SL tablet DISSOLVE 1 TABLET UNDER TONGUE EVERY 5 MINUTES UP TO 15 MIN FOR CHESTPAIN. IF NO RELIEF CALL 911. 25 tablet 3   ONE TOUCH ULTRA TEST test strip TEST BLOOD SUGAR UP TO 4 TIMES DAILY. 150 each 5   ONETOUCH DELICA LANCETS 33G MISC USE AS DIRECTED TO TEST BLOOD SUGAR 4 TIMES DAILY. 150 each 5   pravastatin  (PRAVACHOL ) 40 MG tablet TAKE 1 TABLET BY MOUTH ONCE DAILY IN THE EVENING 90 tablet 2   rOPINIRole  (REQUIP  XL) 4 MG 24 hr tablet TAKE 1 TABLET BY MOUTH AT BEDTIME 90 tablet 1   rOPINIRole  (REQUIP ) 4 MG tablet Take 1/2 (one-half) tablet by mouth twice daily 90 tablet 0   Semaglutide  (OZEMPIC , 2 MG/DOSE, [place]) Inject into the skin.     torsemide  (DEMADEX ) 20 MG tablet Take 2 tablets (40 mg total) by mouth 2 (two) times daily.     No current facility-administered medications on file prior to visit.    Allergies  Allergen Reactions   Tape Rash    Pulls off skin   Farxiga [Dapagliflozin]     Yeast infections, felt sick with it   Cardizem  [Diltiazem  Hcl]     Edema    Cardura [Doxazosin Mesylate]     Headaches / cramps   Crestor  [Rosuvastatin ] Other (See Comments)    Leg cramps   Lipitor [Atorvastatin ] Other (See Comments)    Leg cramps   Paxil [Paroxetine Hcl]     Unknown reaction    Codeine Rash and Other (See Comments)    Headache    Gabapentin Rash     DIAGNOSTIC DATA (LABS, IMAGING, TESTING) - I reviewed patient records, labs, notes, testing and imaging myself where  available.  Lab Results  Component Value Date  WBC 7.9 06/21/2023   HGB 9.9 (L) 07/02/2023   HCT 29.0 (L) 07/02/2023   MCV 93 06/21/2023   PLT 199 06/21/2023      Component Value Date/Time   NA 137 08/03/2023 0934   K 5.1 08/03/2023 0934   CL 104 08/03/2023 0934   CO2 19 (L) 08/03/2023 0934   GLUCOSE 218 (H) 08/03/2023 0934   GLUCOSE 195 (H) 05/24/2023 0857   BUN 33 (H) 08/03/2023 0934   CREATININE 1.35 (H) 08/03/2023 0934   CREATININE 1.11 02/06/2019 0813   CALCIUM  8.8 08/03/2023 0934   PROT 6.9 07/09/2023 0942   ALBUMIN 4.4 07/09/2023 0942   AST 17 07/09/2023 0942   ALT 17 07/09/2023 0942   ALKPHOS 82 07/09/2023 0942   BILITOT 0.3 07/09/2023 0942   GFRNONAA 59 (L) 05/24/2023 0857   GFRNONAA 68 02/06/2019 0813   GFRAA 87 12/04/2019 0832   GFRAA 79 02/06/2019 0813   Lab Results  Component Value Date   CHOL 126 04/26/2023   HDL 36 (L) 04/26/2023   LDLCALC 47 04/26/2023   TRIG 281 (H) 04/26/2023   CHOLHDL 3.5 04/26/2023   Lab Results  Component Value Date   HGBA1C 8.1 (A) 08/09/2023   Lab Results  Component Value Date   VITAMINB12 395 04/26/2023   Lab Results  Component Value Date   TSH 1.960 07/09/2023    PHYSICAL EXAM:  Vitals:   08/16/23 1440  BP: 126/67  Pulse: 74   No data found. Body mass index is 38.79 kg/m.   Wt Readings from Last 3 Encounters:  08/16/23 226 lb (102.5 kg)  08/14/23 225 lb 9.6 oz (102.3 kg)  08/09/23 226 lb 9.6 oz (102.8 kg)     Ht Readings from Last 3 Encounters:  08/16/23 5' 4 (1.626 m)  08/14/23 5' 4 (1.626 m)  08/09/23 5' 4 (1.626 m)      General: The patient is awake, alert and appears not in acute distress and groomed. Head: Normocephalic, atraumatic.  Neck is supple. BMI 38.8 on ozempic .  Higher than in May  Mallampati 3 plus , facial hair.   neck circumference: 20.5  inches .   Cardiovascular:  Regular rate and cardiac rhythm by pulse,  without distended neck veins. Respiratory: Lungs are clear to  auscultation.  Skin:  With evidence of ankle edema,.bilaterally  Trunk: The patient's posture is  relaxed    Neurologic exam : The patient is awake and alert, oriented to place and time.   Memory subjective described as OK  Attention span & concentration ability appears normal.  Speech is fluent,  with dysphonia . Mood and affect are cooperative , appropriate ''  Cranial nerves: no loss of smell or taste reported  Pupils are equal and briskly reactive to light. Funduscopic exam deferred. .  Extraocular movements in vertical and horizontal planes were intact and without nystagmus. No Diplopia. Visual fields by finger perimetry are intact. Hearing was intact to soft voice and finger rubbing.    Facial sensation intact to fine touch.  Facial motor strength is symmetric and tongue in midline.  Neck ROM : rotation, tilt and flexion extension were normal for age and shoulder shrug was symmetrical.    Motor exam:  Symmetric bulk, tone and ROM.  Left foot weakness of dorsiflexion,  Normal tone without cog- wheeling, symmetric grip strength .     Sensory:  Fine touch, pinprick and vibration were  all absent over both knees and ankles.  ( Profound numbness0  Proprioception tested in the upper extremities was normal.   Coordination: Rapid alternating movements in the fingers/hands were of normal speed.  The Finger-to-nose maneuver was intact.    Gait and station: Patient could rise unassisted from a seated position, walked without assistive device.  Stance is of normal width/ base and the patient turned with 4 steps.  Toe and heel walk were deferred.  Deep tendon reflexes: in the  upper and lower extremities are absent .   ASSESSMENT AND PLAN :   73 y.o. 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 Requip  is maxed out and doesn't help anymore.  Anemia persistent, r iron  deficiency, followed by oncology for abnormal myeloma  panel.   2) OSA on CPAP.  Hypersomnia is right now very high-  Had to pause for pneumonia. Once back on CPAP he will do better -  OSA is well controlled. Its not the OSA that causes sleepiness.   3) DM and  severe neuropathy.  Feels improvement on magnesium  -  400 mg daily, nephrology unaware.  Alpha lipoic acid  in AM and pm po.  Continue. .    Plan: - opening the settings of CPAP.  10-15 cm water , 3 cm EPR.  If SOB, please use your rescue inhaler   Refilled Requip , cannot go higher.  Spinal stenosis, neurogenic claudication. Diabetic polyneuropathy.       I would like to thank  Alphonsa Glendia LABOR, Md [address] [place],  [place] [address] for allowing me to meet with this pleasant patient.   Sleep Clinic Patients are generally offered input on sleep hygiene, life style changes and how to improve compliance with medical treatment where applicable. Review and reiteration of good sleep hygiene measures is offered to any sleep clinic patient, be it in the first consultation or with any follow up visits.    Any patient with sleepiness should be cautioned not to drive, work at heights, or operate dangerous or heavy equipment when feeling tired or sleepy.      The patient will be seen in follow-up in the sleep clinic at [facility] for discussion of test results, sleep related symptoms and treatment compliance review, further management strategies, etc.   The referring provider will be notified of the test results.   The patient's condition requires frequent monitoring and adjustments in the treatment plan, reflecting the ongoing complexity of care.  This provider is the continuing focal point for all needed services for this condition.  After spending a total time of  35  minutes face to face and time for  history taking, physical and neurologic examination, review of laboratory studies,  personal review of imaging studies, reports and results of other testing and review of referral  information / records as far as provided in visit,   Electronically signed by: Dedra Gores, MD 08/16/2023 3:13 PM  [facility] Board certified by The [employer] of Sleep Medicine and Diplomate of the [employer] of Sleep Medicine. Board certified In Neurology through the ABPN, Fellow of the [employer] of Neurology.

## 2023-08-17 ENCOUNTER — Ambulatory Visit: Admitting: Adult Health

## 2023-08-20 ENCOUNTER — Other Ambulatory Visit: Payer: Self-pay | Admitting: Neurology

## 2023-08-23 ENCOUNTER — Other Ambulatory Visit: Payer: Self-pay | Admitting: Neurology

## 2023-08-24 ENCOUNTER — Telehealth: Payer: Self-pay | Admitting: Nurse Practitioner

## 2023-08-24 ENCOUNTER — Inpatient Hospital Stay: Attending: Hematology

## 2023-08-24 DIAGNOSIS — R768 Other specified abnormal immunological findings in serum: Secondary | ICD-10-CM | POA: Insufficient documentation

## 2023-08-24 DIAGNOSIS — R053 Chronic cough: Secondary | ICD-10-CM | POA: Insufficient documentation

## 2023-08-24 DIAGNOSIS — R059 Cough, unspecified: Secondary | ICD-10-CM | POA: Diagnosis not present

## 2023-08-24 DIAGNOSIS — R0981 Nasal congestion: Secondary | ICD-10-CM | POA: Diagnosis not present

## 2023-08-24 DIAGNOSIS — I509 Heart failure, unspecified: Secondary | ICD-10-CM | POA: Diagnosis not present

## 2023-08-24 DIAGNOSIS — I2510 Atherosclerotic heart disease of native coronary artery without angina pectoris: Secondary | ICD-10-CM | POA: Insufficient documentation

## 2023-08-24 DIAGNOSIS — D509 Iron deficiency anemia, unspecified: Secondary | ICD-10-CM | POA: Insufficient documentation

## 2023-08-24 DIAGNOSIS — R5382 Chronic fatigue, unspecified: Secondary | ICD-10-CM | POA: Diagnosis not present

## 2023-08-24 DIAGNOSIS — Z79899 Other long term (current) drug therapy: Secondary | ICD-10-CM | POA: Insufficient documentation

## 2023-08-24 DIAGNOSIS — G4733 Obstructive sleep apnea (adult) (pediatric): Secondary | ICD-10-CM | POA: Insufficient documentation

## 2023-08-24 DIAGNOSIS — K30 Functional dyspepsia: Secondary | ICD-10-CM | POA: Diagnosis not present

## 2023-08-24 DIAGNOSIS — R0602 Shortness of breath: Secondary | ICD-10-CM | POA: Diagnosis not present

## 2023-08-24 DIAGNOSIS — K5900 Constipation, unspecified: Secondary | ICD-10-CM | POA: Insufficient documentation

## 2023-08-24 DIAGNOSIS — D649 Anemia, unspecified: Secondary | ICD-10-CM

## 2023-08-24 DIAGNOSIS — N189 Chronic kidney disease, unspecified: Secondary | ICD-10-CM | POA: Diagnosis not present

## 2023-08-24 LAB — CBC WITH DIFFERENTIAL/PLATELET
Abs Immature Granulocytes: 0.08 K/uL — ABNORMAL HIGH (ref 0.00–0.07)
Basophils Absolute: 0.1 K/uL (ref 0.0–0.1)
Basophils Relative: 1 %
Eosinophils Absolute: 0.3 K/uL (ref 0.0–0.5)
Eosinophils Relative: 3 %
HCT: 33.4 % — ABNORMAL LOW (ref 39.0–52.0)
Hemoglobin: 10.6 g/dL — ABNORMAL LOW (ref 13.0–17.0)
Immature Granulocytes: 1 %
Lymphocytes Relative: 15 %
Lymphs Abs: 1.5 K/uL (ref 0.7–4.0)
MCH: 29.8 pg (ref 26.0–34.0)
MCHC: 31.7 g/dL (ref 30.0–36.0)
MCV: 93.8 fL (ref 80.0–100.0)
Monocytes Absolute: 0.9 K/uL (ref 0.1–1.0)
Monocytes Relative: 9 %
Neutro Abs: 7.2 K/uL (ref 1.7–7.7)
Neutrophils Relative %: 71 %
Platelets: 202 K/uL (ref 150–400)
RBC: 3.56 MIL/uL — ABNORMAL LOW (ref 4.22–5.81)
RDW: 14.1 % (ref 11.5–15.5)
WBC: 10 K/uL (ref 4.0–10.5)
nRBC: 0 % (ref 0.0–0.2)

## 2023-08-24 LAB — COMPREHENSIVE METABOLIC PANEL WITH GFR
ALT: 15 U/L (ref 0–44)
AST: 16 U/L (ref 15–41)
Albumin: 3.6 g/dL (ref 3.5–5.0)
Alkaline Phosphatase: 58 U/L (ref 38–126)
Anion gap: 9 (ref 5–15)
BUN: 48 mg/dL — ABNORMAL HIGH (ref 8–23)
CO2: 23 mmol/L (ref 22–32)
Calcium: 9 mg/dL (ref 8.9–10.3)
Chloride: 104 mmol/L (ref 98–111)
Creatinine, Ser: 1.45 mg/dL — ABNORMAL HIGH (ref 0.61–1.24)
GFR, Estimated: 51 mL/min — ABNORMAL LOW (ref >60)
Glucose, Bld: 280 mg/dL — ABNORMAL HIGH (ref 70–99)
Potassium: 4.8 mmol/L (ref 3.5–5.1)
Sodium: 136 mmol/L (ref 135–145)
Total Bilirubin: 0.6 mg/dL (ref 0.0–1.2)
Total Protein: 7 g/dL (ref 6.5–8.1)

## 2023-08-24 LAB — IRON AND TIBC
Iron: 65 ug/dL (ref 45–182)
Saturation Ratios: 23 % (ref 17.9–39.5)
TIBC: 285 ug/dL (ref 250–450)
UIBC: 220 ug/dL

## 2023-08-24 LAB — FERRITIN: Ferritin: 202 ng/mL (ref 24–336)

## 2023-08-24 NOTE — Telephone Encounter (Signed)
 Called pt and lvm to let pt know pt assistance of fiasp  and pen needles were ready for pick up

## 2023-08-27 ENCOUNTER — Other Ambulatory Visit: Payer: Self-pay

## 2023-08-27 MED ORDER — MODAFINIL 200 MG PO TABS: 200.0000 mg | ORAL_TABLET | Freq: Every day | ORAL | 5 refills | Status: AC

## 2023-08-27 NOTE — Telephone Encounter (Signed)
 Pt picked up pt assistance of fiasp  and pen needles

## 2023-08-27 NOTE — Telephone Encounter (Signed)
 Spoke w/Pt regarding Modafinil  refill. Pt has not had filled since 04/17/23 and office received refill request. Pt stated he did go without med for a while to see if he could manage but was unable to do so and needs a refill. Will send refill to provider for approval.

## 2023-08-31 ENCOUNTER — Inpatient Hospital Stay: Admitting: Oncology

## 2023-08-31 DIAGNOSIS — D649 Anemia, unspecified: Secondary | ICD-10-CM | POA: Diagnosis not present

## 2023-08-31 DIAGNOSIS — R5383 Other fatigue: Secondary | ICD-10-CM | POA: Diagnosis not present

## 2023-08-31 DIAGNOSIS — N189 Chronic kidney disease, unspecified: Secondary | ICD-10-CM

## 2023-08-31 DIAGNOSIS — R768 Other specified abnormal immunological findings in serum: Secondary | ICD-10-CM

## 2023-08-31 NOTE — Assessment & Plan Note (Addendum)
 Creatinine baseline is from 1.45-2.04 over the past year.  Followed by Dr. Jaci for kidneys.

## 2023-08-31 NOTE — Progress Notes (Signed)
 [facility] OFFICE PROGRESS NOTE  Alphonsa Glendia LABOR, MD  ASSESSMENT & PLAN:   I connected with Paul Baker on 08/31/23 at  2:00 PM EDT by telephone visit and verified that I am speaking with the correct person using two identifiers.   I discussed the limitations, risks, security and privacy concerns of performing an evaluation and management service by telemedicine and the availability of in-person appointments. I also discussed with the patient that there may be a patient responsible charge related to this service. The patient expressed understanding and agreed to proceed.   Other persons participating in the visit and their role in the encounter: NP, Patient     Patient's location: Home  Provider's location: Clinic    Assessment & Plan Elevated serum immunoglobulin free light chains - Kappa light chains most likely elevated from mild CKD. - Hematology workup from 04/21/2022 showed elevation of light chain with normal ratio.  24-hour UPEP did not reveal monoclonal protein. -Elevated serum free light chain is from inflammation versus kidney dysfunction. -No additional workup is needed at this time. Normocytic anemia - Etiology likely from both iron  deficiency and CKD. -Labs from 08/24/2023 show iron  saturations 23% with a normal TIBC.  Ferritin 202.  Hemoglobin 10.6. -He received 2 doses of 300 mg IV Venofer  on 02/05/2023 and 02/12/2023. -Recommend 1 dose of IV Venofer  (300 mg).  -Previously have ruled out nutritional deficiencies and hemolysis.  Stool occult negative. -EGD/colonoscopy on 01/31/2022 did not reveal any evidence of bleeding. -If hemoglobin remains below 10 with adequate iron  storage desaturations between 25 and 30%, would recommend erythropoietin stimulating agent such as Retacrit. -He currently not on iron  supplements.  Reports they cause constipation and GI upset.  He is on vitamin B12 and vitamin D supplements. Other fatigue -He is followed closely by  pulmonology and cardiology. -He had a recent echocardiogram with fairly unremarkable results.  EF was 60 to 65%. -Had recent heart catheterization with Dr. Mady which was more or less stable.  Torsemide  was increased to 60 mg twice daily.  Chronic kidney disease, unspecified CKD stage Creatinine baseline is from 1.45-2.04 over the past year.  Followed by Dr. Jaci for kidneys.   Orders Placed This Encounter  Procedures   Iron  and TIBC ([facility] ONLY)    Standing Status:   Future    Expected Date:   12/31/2023    Expiration Date:   03/30/2024   Ferritin    Standing Status:   Future    Expected Date:   12/31/2023    Expiration Date:   03/30/2024   Comprehensive metabolic panel    Standing Status:   Future    Expected Date:   12/31/2023    Expiration Date:   03/30/2024   CBC with Differential    Standing Status:   Future    Expected Date:   12/31/2023    Expiration Date:   03/30/2024    INTERVAL HISTORY: Patient returns for follow-up for IDA status post IV iron  in the past.  He has history of CKD followed by Dr. Prescilla, CHF and CAD followed by Dr. Mady and [facility].  Reports he has a persistent chronic cough with nasal congestion over the past several months.  Has chronic fatigue and shortness of breath especially with exertion.  Reports he did feel better following the iron  infusions a few months ago.  He denies any bright red blood per rectum, melena or hematochezia.  Reports he gets out of breath  pretty easy.  He did recently have a heart catheterization on 07/02/2023 with Dr. Mady which showed stable appearance of the coronary arteries since cath in 2020 with 70% stenosis of nondominant RCA and mild to moderate nonobstructive LCA disease.  Mild to moderate we elevated left heart and pulmonary artery pressures.  Severely elevated right heart filling pressures.  His torsemide  was increased to 60 mg twice daily due to CHF.  Not currently on iron  supplements. Taking  B12, magnesium  and valproic acid with biotin  and vitamin D.   We reviewed iron  panel, CMP, ferritin and CBC.  SUMMARY OF HEMATOLOGIC HISTORY: Oncology History   No history exists.     CBC    Component Value Date/Time   WBC 10.0 08/24/2023 1014   RBC 3.56 (L) 08/24/2023 1014   HGB 10.6 (L) 08/24/2023 1014   HGB 10.5 (L) 06/21/2023 1037   HCT 33.4 (L) 08/24/2023 1014   HCT 32.8 (L) 06/21/2023 1037   PLT 202 08/24/2023 1014   PLT 199 06/21/2023 1037   MCV 93.8 08/24/2023 1014   MCV 93 06/21/2023 1037   MCH 29.8 08/24/2023 1014   MCHC 31.7 08/24/2023 1014   RDW 14.1 08/24/2023 1014   RDW 14.0 06/21/2023 1037   LYMPHSABS 1.5 08/24/2023 1014   LYMPHSABS 1.6 09/01/2021 1506   MONOABS 0.9 08/24/2023 1014   EOSABS 0.3 08/24/2023 1014   EOSABS 0.3 09/01/2021 1506   BASOSABS 0.1 08/24/2023 1014   BASOSABS 0.1 09/01/2021 1506       Latest Ref Rng & Units 08/24/2023   10:14 AM 08/03/2023    9:34 AM 07/20/2023   11:01 AM  CMP  Glucose 70 - 99 mg/dL 719  781  95   BUN 8 - 23 mg/dL 48  33  67   Creatinine 0.61 - 1.24 mg/dL 8.54  8.64  7.95   Sodium 135 - 145 mmol/L 136  137  138   Potassium 3.5 - 5.1 mmol/L 4.8  5.1  4.9   Chloride 98 - 111 mmol/L 104  104  98   CO2 22 - 32 mmol/L 23  19  23    Calcium  8.9 - 10.3 mg/dL 9.0  8.8  9.2   Total Protein 6.5 - 8.1 g/dL 7.0     Total Bilirubin 0.0 - 1.2 mg/dL 0.6     Alkaline Phos 38 - 126 U/L 58     AST 15 - 41 U/L 16     ALT 0 - 44 U/L 15        Lab Results  Component Value Date   FERRITIN 202 08/24/2023   VITAMINB12 395 04/26/2023    There were no vitals filed for this visit.  Review of System:  Review of Systems  Constitutional:  Positive for malaise/fatigue. Negative for weight loss.  Respiratory:  Positive for cough and shortness of breath.   Gastrointestinal:  Negative for abdominal pain, blood in stool, constipation and diarrhea.  Musculoskeletal:  Negative for falls and joint pain.  Neurological:  Negative for  dizziness.  Endo/Heme/Allergies:  Does not bruise/bleed easily.  Psychiatric/Behavioral:  Negative for depression. The patient is not nervous/anxious and does not have insomnia.     Physical Exam: Physical Exam Neurological:     Mental Status: He is alert and oriented to person, place, and time.      I provided 20 minutes of non face-to-face telephone visit time during this encounter, and > 50% was spent counseling as documented under my assessment & plan.  Delon Hope, NP 08/31/2023 9:55 AM

## 2023-08-31 NOTE — Assessment & Plan Note (Addendum)
-   Kappa light chains most likely elevated from mild CKD. - Hematology workup from 04/21/2022 showed elevation of light chain with normal ratio.  24-hour UPEP did not reveal monoclonal protein. -Elevated serum free light chain is from inflammation versus kidney dysfunction. -No additional workup is needed at this time.

## 2023-08-31 NOTE — Assessment & Plan Note (Addendum)
-   Etiology likely from both iron  deficiency and CKD. -Labs from 08/24/2023 show iron  saturations 23% with a normal TIBC.  Ferritin 202.  Hemoglobin 10.6. -He received 2 doses of 300 mg IV Venofer  on 02/05/2023 and 02/12/2023. -Recommend 1 dose of IV Venofer  (300 mg).  -Previously have ruled out nutritional deficiencies and hemolysis.  Stool occult negative. -EGD/colonoscopy on 01/31/2022 did not reveal any evidence of bleeding. -If hemoglobin remains below 10 with adequate iron  storage desaturations between 25 and 30%, would recommend erythropoietin stimulating agent such as Retacrit. -He currently not on iron  supplements.  Reports they cause constipation and GI upset.  He is on vitamin B12 and vitamin D supplements.

## 2023-08-31 NOTE — Assessment & Plan Note (Addendum)
-  He is followed closely by pulmonology and cardiology. -He had a recent echocardiogram with fairly unremarkable results.  EF was 60 to 65%. -Had recent heart catheterization with Dr. Mady which was more or less stable.  Torsemide  was increased to 60 mg twice daily.

## 2023-09-06 DIAGNOSIS — E1159 Type 2 diabetes mellitus with other circulatory complications: Secondary | ICD-10-CM | POA: Diagnosis not present

## 2023-09-07 ENCOUNTER — Other Ambulatory Visit: Payer: Self-pay | Admitting: Medical Genetics

## 2023-09-07 ENCOUNTER — Encounter: Payer: Self-pay | Admitting: Physician Assistant

## 2023-09-07 ENCOUNTER — Ambulatory Visit (INDEPENDENT_AMBULATORY_CARE_PROVIDER_SITE_OTHER): Admitting: Physician Assistant

## 2023-09-07 ENCOUNTER — Ambulatory Visit

## 2023-09-07 ENCOUNTER — Ambulatory Visit: Payer: Self-pay | Admitting: Physician Assistant

## 2023-09-07 ENCOUNTER — Ambulatory Visit (HOSPITAL_COMMUNITY)
Admission: RE | Admit: 2023-09-07 | Discharge: 2023-09-07 | Disposition: A | Discharge status: Home / Self Care | Source: Ambulatory Visit | Attending: Physician Assistant | Admitting: Physician Assistant

## 2023-09-07 VITALS — BP 148/69 | HR 90 | Ht 64.0 in | Wt 229.0 lb

## 2023-09-07 DIAGNOSIS — R051 Acute cough: Secondary | ICD-10-CM | POA: Diagnosis not present

## 2023-09-07 DIAGNOSIS — R059 Cough, unspecified: Secondary | ICD-10-CM | POA: Diagnosis not present

## 2023-09-07 NOTE — Assessment & Plan Note (Signed)
 Cough, congestion, and phlegm for over a week without fever. Chronic shortness of breath. History of pneumonia with prolonged recovery. Patient appears stable today. Benign rather exam, lungs clear to auscultation bilaterally. Likely self-resolving viral infection. Supportive care reviewed with patient.  - Order chest x-ray to rule out lower respiratory tract infection. Discuss need for antibiotics based on XR results. Will contact patient with results.  - Start Mucinex  bid for mucus production, samples provided. - Tylenol  or ibuprofen  as needed for pain and fever.  -  Patient instructed to return to clinic if worsening shortness of breath, chest pain, hypoxia, or other concerns. Patient agreeable to plan.

## 2023-09-07 NOTE — Progress Notes (Signed)
 Acute Office Visit  Subjective:     Patient ID: Paul Baker, male    DOB: 1950-02-02, 73 y.o.   MRN: [id number]  Discussed the use of AI scribe software for clinical note transcription with the patient, who gave verbal consent to proceed.  History of Present Illness Paul Baker is a 73 year old male with a history of pneumonia and atrial fibrillation who presents with cough and congestion.  He has experienced cough and congestion with phlegm for over a week, accompanied by headache and dizziness. He denies fever or chest pain. Shortness of breath is chronic and persistent.  Earlier this year, he had 'walking pneumonia' for six months, confirmed clear by chest x-ray in February.  He uses an inhaler for temporary relief of cough and shortness of breath and relies on a Trelegy inhaler to prevent significant morning shortness of breath. He has not used over-the-counter cold medications recently, and Tessalon  Perles are ineffective.  He uses a CPAP machine at night but struggles with choking episodes, due to sputum production, leading to occasional upright sleeping. He stays hydrated by drinking water.   Review of Systems  Constitutional:  Positive for malaise/fatigue. Negative for fever and weight loss.  HENT:  Positive for congestion. Negative for ear discharge, ear pain and sore throat.   Respiratory:  Positive for cough, sputum production and shortness of breath. Negative for wheezing.   Cardiovascular:  Negative for chest pain and palpitations.  Musculoskeletal:  Negative for myalgias.  Neurological:  Positive for dizziness and headaches.        Objective:     BP (!) 148/69   Pulse 90   Ht 5' 4 (1.626 m)   Wt 229 lb (103.9 kg)   SpO2 97%   BMI 39.31 kg/m   Physical Exam Constitutional:      General: He is not in acute distress.    Appearance: Normal appearance. He is obese. He is not ill-appearing.  HENT:     Head: Normocephalic and atraumatic.      Mouth/Throat:     Mouth: Mucous membranes are moist.     Pharynx: Oropharynx is clear.  Eyes:     Extraocular Movements: Extraocular movements intact.     Conjunctiva/sclera: Conjunctivae normal.  Cardiovascular:     Rate and Rhythm: Normal rate and regular rhythm.     Heart sounds: Normal heart sounds. No murmur heard. Pulmonary:     Effort: Pulmonary effort is normal. No respiratory distress.     Breath sounds: Normal breath sounds. No wheezing, rhonchi or rales.  Skin:    General: Skin is warm and dry.  Neurological:     General: No focal deficit present.     Mental Status: He is alert and oriented to person, place, and time.  Psychiatric:        Mood and Affect: Mood normal.        Behavior: Behavior normal.     No results found for any visits on 09/07/23.      Assessment & Plan:  Acute cough Assessment & Plan: Cough, congestion, and phlegm for over a week without fever. Chronic shortness of breath. History of pneumonia with prolonged recovery. Patient appears stable today. Benign rather exam, lungs clear to auscultation bilaterally. Likely self-resolving viral infection. Supportive care reviewed with patient.  - Order chest x-ray to rule out lower respiratory tract infection. Discuss need for antibiotics based on XR results. Will contact patient with results.  - Start Mucinex  bid  for mucus production, samples provided. - Tylenol  or ibuprofen  as needed for pain and fever.  -  Patient instructed to return to clinic if worsening shortness of breath, chest pain, hypoxia, or other concerns. Patient agreeable to plan.   Orders: -     DG Chest 2 View   Return if symptoms worsen or fail to improve.  Charmaine Sheketa Ende, PA-C

## 2023-09-07 NOTE — Progress Notes (Signed)
 OK for E2C2 to inform patient

## 2023-09-07 NOTE — Telephone Encounter (Signed)
-----   Message from Lacey Grooms sent at 09/07/2023 12:58 PM EDT ----- No signs of infection or lower lung infection. Symptoms likely related to acute viral illness. No need for antibiotics at this time. Advised Mucinex  twice daily. Tylenol  and ibuprofen  as needed for  pain or fever. Adivse adequate hydration. He is to follow up for new or worsening shortness of breath, chest pain, wheezing, or difficulty breathing or if symptoms persist for more than 2 weeks.  ----- Message ----- From: Interface, Rad Results In Sent: 09/07/2023  12:01 PM EDT To: Charmaine Grooms, PA-C

## 2023-09-07 NOTE — Telephone Encounter (Signed)
 Left message informing patient to call back regarding results and recommendations.

## 2023-09-10 ENCOUNTER — Other Ambulatory Visit (HOSPITAL_COMMUNITY)
Admission: RE | Admit: 2023-09-10 | Discharge: 2023-09-10 | Disposition: A | Discharge status: Home / Self Care | Payer: Self-pay | Source: Ambulatory Visit | Attending: Medical Genetics | Admitting: Medical Genetics

## 2023-09-11 ENCOUNTER — Ambulatory Visit: Admitting: Cardiology

## 2023-09-17 ENCOUNTER — Inpatient Hospital Stay

## 2023-09-18 LAB — GENECONNECT MOLECULAR SCREEN: Genetic Analysis Overall Interpretation: NEGATIVE

## 2023-09-19 ENCOUNTER — Encounter (HOSPITAL_COMMUNITY): Payer: Self-pay

## 2023-09-19 ENCOUNTER — Emergency Department (HOSPITAL_COMMUNITY)
Admission: EM | Admit: 2023-09-19 | Discharge: 2023-09-19 | Disposition: A | Discharge status: Home / Self Care | Attending: Emergency Medicine | Admitting: Emergency Medicine

## 2023-09-19 ENCOUNTER — Emergency Department (HOSPITAL_COMMUNITY)

## 2023-09-19 ENCOUNTER — Other Ambulatory Visit: Payer: Self-pay

## 2023-09-19 DIAGNOSIS — Z79899 Other long term (current) drug therapy: Secondary | ICD-10-CM | POA: Diagnosis not present

## 2023-09-19 DIAGNOSIS — E119 Type 2 diabetes mellitus without complications: Secondary | ICD-10-CM | POA: Insufficient documentation

## 2023-09-19 DIAGNOSIS — I2510 Atherosclerotic heart disease of native coronary artery without angina pectoris: Secondary | ICD-10-CM | POA: Insufficient documentation

## 2023-09-19 DIAGNOSIS — I700 Atherosclerosis of aorta: Secondary | ICD-10-CM | POA: Insufficient documentation

## 2023-09-19 DIAGNOSIS — Z7951 Long term (current) use of inhaled steroids: Secondary | ICD-10-CM | POA: Diagnosis not present

## 2023-09-19 DIAGNOSIS — Z7901 Long term (current) use of anticoagulants: Secondary | ICD-10-CM | POA: Diagnosis not present

## 2023-09-19 DIAGNOSIS — R197 Diarrhea, unspecified: Secondary | ICD-10-CM | POA: Diagnosis not present

## 2023-09-19 DIAGNOSIS — Z794 Long term (current) use of insulin: Secondary | ICD-10-CM | POA: Diagnosis not present

## 2023-09-19 DIAGNOSIS — J45909 Unspecified asthma, uncomplicated: Secondary | ICD-10-CM | POA: Insufficient documentation

## 2023-09-19 DIAGNOSIS — I10 Essential (primary) hypertension: Secondary | ICD-10-CM | POA: Insufficient documentation

## 2023-09-19 DIAGNOSIS — R1030 Lower abdominal pain, unspecified: Secondary | ICD-10-CM | POA: Diagnosis not present

## 2023-09-19 DIAGNOSIS — I4891 Unspecified atrial fibrillation: Secondary | ICD-10-CM | POA: Insufficient documentation

## 2023-09-19 DIAGNOSIS — N400 Enlarged prostate without lower urinary tract symptoms: Secondary | ICD-10-CM | POA: Insufficient documentation

## 2023-09-19 LAB — URINALYSIS, ROUTINE W REFLEX MICROSCOPIC
Bilirubin Urine: NEGATIVE
Glucose, UA: NEGATIVE mg/dL
Hgb urine dipstick: NEGATIVE
Ketones, ur: NEGATIVE mg/dL
Leukocytes,Ua: NEGATIVE
Nitrite: NEGATIVE
Protein, ur: NEGATIVE mg/dL
Specific Gravity, Urine: 1.025 (ref 1.005–1.030)
pH: 6 (ref 5.0–8.0)

## 2023-09-19 LAB — COMPREHENSIVE METABOLIC PANEL WITH GFR
ALT: 16 U/L (ref 0–44)
AST: 19 U/L (ref 15–41)
Albumin: 3.9 g/dL (ref 3.5–5.0)
Alkaline Phosphatase: 60 U/L (ref 38–126)
Anion gap: 11 (ref 5–15)
BUN: 47 mg/dL — ABNORMAL HIGH (ref 8–23)
CO2: 21 mmol/L — ABNORMAL LOW (ref 22–32)
Calcium: 8.6 mg/dL — ABNORMAL LOW (ref 8.9–10.3)
Chloride: 103 mmol/L (ref 98–111)
Creatinine, Ser: 1.61 mg/dL — ABNORMAL HIGH (ref 0.61–1.24)
GFR, Estimated: 45 mL/min — ABNORMAL LOW (ref >60)
Glucose, Bld: 188 mg/dL — ABNORMAL HIGH (ref 70–99)
Potassium: 4.7 mmol/L (ref 3.5–5.1)
Sodium: 135 mmol/L (ref 135–145)
Total Bilirubin: 0.8 mg/dL (ref 0.0–1.2)
Total Protein: 7.5 g/dL (ref 6.5–8.1)

## 2023-09-19 LAB — LIPASE, BLOOD: Lipase: 38 U/L (ref 11–51)

## 2023-09-19 LAB — CBC
HCT: 35.1 % — ABNORMAL LOW (ref 39.0–52.0)
Hemoglobin: 11.1 g/dL — ABNORMAL LOW (ref 13.0–17.0)
MCH: 29.5 pg (ref 26.0–34.0)
MCHC: 31.6 g/dL (ref 30.0–36.0)
MCV: 93.4 fL (ref 80.0–100.0)
Platelets: 191 K/uL (ref 150–400)
RBC: 3.76 MIL/uL — ABNORMAL LOW (ref 4.22–5.81)
RDW: 14.4 % (ref 11.5–15.5)
WBC: 9.9 K/uL (ref 4.0–10.5)
nRBC: 0 % (ref 0.0–0.2)

## 2023-09-19 LAB — C DIFFICILE QUICK SCREEN W PCR REFLEX
C Diff antigen: NEGATIVE
C Diff interpretation: NOT DETECTED
C Diff toxin: NEGATIVE

## 2023-09-19 MED ORDER — IOHEXOL 300 MG/ML  SOLN
80.0000 mL | Freq: Once | INTRAMUSCULAR | Status: AC | PRN
  Administered 2023-09-19: 80 mL via INTRAVENOUS

## 2023-09-19 MED ORDER — SODIUM CHLORIDE 0.9 % IV BOLUS
500.0000 mL | Freq: Once | INTRAVENOUS | Status: AC
  Administered 2023-09-19: 500 mL via INTRAVENOUS

## 2023-09-19 MED ORDER — MORPHINE SULFATE (PF) 4 MG/ML IV SOLN
3.0000 mg | Freq: Once | INTRAVENOUS | Status: AC
  Administered 2023-09-19: 3 mg via INTRAVENOUS
  Filled 2023-09-19: qty 1

## 2023-09-19 NOTE — ED Triage Notes (Signed)
 Pt presents to ED with complaints of diarrhea x 4 days, denies abx use, denies abdominal pain.

## 2023-09-19 NOTE — Discharge Instructions (Signed)
 Continue the Imodium as directed if needed for the diarrhea.  Bring a stool sample back to the lab when possible.  Call your primary doctor to arrange follow up appt.  Return to ER for any worsening symptoms

## 2023-09-19 NOTE — ED Notes (Signed)
 ED Provider at bedside.

## 2023-09-19 NOTE — ED Notes (Signed)
 Instructed pt for another stool sample as first one collected was not enough

## 2023-09-19 NOTE — ED Provider Notes (Signed)
 [place] EMERGENCY DEPARTMENT AT [facility] Provider Note   CSN: [id number] Arrival date & time: 09/19/23  9076     Patient presents with: Diarrhea   Paul Baker is a 73 y.o. male.    Diarrhea Associated symptoms: no abdominal pain, no chills, no fever, no headaches, no myalgias and no vomiting        Paul Baker is a 73 y.o. male past medical history of pulmonary embolism, atrial fibrillation, chronically anticoagulated, type 2 diabetes, obstructive sleep apnea, hypertension, asthma, CAD who presents to the Emergency Department complaining of persistent diarrhea x 4 days.  Describes multiple episodes of watery brown stools over the last several days.  No black or bloody stools.  Recently returned from the beach and is unsure if he may have eaten something there that caused his diarrhea.  He denies any fever or chills, abdominal pain, chest pain or shortness of breath.  No respiratory symptoms or nausea vomiting.  Denies any recent antibiotics or new medications.  Prior to Admission medications   Medication Sig Start Date End Date Taking? Authorizing Provider  acetaminophen  (TYLENOL ) 325 MG tablet Take 2 tablets (650 mg total) by mouth every 6 (six) hours as needed for mild pain (or Fever >/= 101). 12/05/19   Pearlean Manus, MD  albuterol  (PROVENTIL ) (2.5 MG/3ML) 0.083% nebulizer solution Take 3 mLs (2.5 mg total) by nebulization every 6 (six) hours as needed for up to 7 days for wheezing or shortness of breath. 07/14/22   Pearlean Manus, MD  albuterol  (VENTOLIN  HFA) 108 (90 Base) MCG/ACT inhaler Inhale 2 puffs into the lungs every 6 (six) hours as needed for wheezing or shortness of breath. 07/14/22   Pearlean Manus, MD  alfuzosin  (UROXATRAL ) 10 MG 24 hr tablet Take 10 mg by mouth daily. 03/02/21   [provider]  Alpha Lipoic Acid -Biotin  ER 600-450 MG-MCG TBCR Take 600 mg by mouth every morning. 05/04/22   Dohmeier, Dedra, MD  benzonatate   (TESSALON ) 200 MG capsule Take 1 capsule (200 mg total) by mouth 3 (three) times daily as needed. 06/26/23 06/25/24  Parrett, Madelin RAMAN, NP  clobetasol  cream (TEMOVATE ) 0.05 % Apply thin amount 2 times daily prn to psoriasis not for face axilla or groin 05/26/22   Alphonsa Glendia LABOR, MD  Continuous Glucose Sensor (DEXCOM G7 SENSOR) MISC Change sensor every 10 days 05/22/22   Therisa Benton PARAS, NP  Cyanocobalamin  (VITAMIN B 12 PO) Take 5,000 mcg by mouth daily.    [provider]  dabigatran  (PRADAXA ) 150 MG CAPS capsule Take 1 capsule (150 mg total) by mouth 2 (two) times daily. 07/13/23   Debera Jayson MATSU, MD  desonide  (DESOWEN ) 0.05 % cream Apply once daily prn to facial rash 11/22/22   Alphonsa Glendia LABOR, MD  dextromethorphan -guaiFENesin  (MUCINEX  DM) 30-600 MG 12hr tablet Take 1 tablet by mouth 2 (two) times daily. Patient taking differently: Take 1 tablet by mouth as needed for cough. 07/14/22   Pearlean Manus, MD  ezetimibe  (ZETIA ) 10 MG tablet Take 10 mg by mouth daily. 01/11/23   [provider]  FIASP  FLEXTOUCH 100 UNIT/ML FlexTouch Pen Inject 30-36 Units into the skin in the morning, at noon, and at bedtime. 06/20/22   [provider]  fluticasone  (FLONASE ) 50 MCG/ACT nasal spray Place 1 spray into both nostrils daily. Patient taking differently: Place 1 spray into both nostrils daily as needed. 06/27/22   Sood, Vineet, MD  Fluticasone -Umeclidin-Vilant (TRELEGY ELLIPTA ) 100-62.5-25 MCG/ACT AEPB Inhale 1 puff into  the lungs daily. 08/14/23   Parrett, Madelin RAMAN, NP  Glucagon  (GVOKE HYPOPEN  2-PACK) 1 MG/0.2ML SOAJ Inject 1 mg subcutaneously once as needed for low blood sugar. May repeat dose in 15 minutes as needed using a new device. Patient taking differently: 2 mg. Inject 2 mg subcutaneously once as needed for low blood sugar. May repeat dose in 15 minutes as needed using a new device. 01/28/21   Alphonsa Glendia LABOR, MD  insulin  degludec (TRESIBA  FLEXTOUCH) 200 UNIT/ML FlexTouch Pen  Inject 120 Units into the skin at bedtime. 09/07/22   Therisa Benton PARAS, NP  Insulin  Pen Needle (B-D ULTRAFINE III SHORT PEN) 31G X 8 MM MISC USE 1 PEN NEEDLE THREE TIMES DAILY 01/19/22   Therisa Benton PARAS, NP  isosorbide  mononitrate (IMDUR ) 30 MG 24 hr tablet Take 1 tablet (30 mg total) by mouth 2 (two) times daily. Patient taking differently: Take 30 mg by mouth daily. 05/23/23   Furth, Cadence H, PA-C  levothyroxine  (SYNTHROID ) 137 MCG tablet Take 1 tablet (137 mcg total) by mouth daily before breakfast. 08/09/23   Therisa Benton PARAS, NP  loratadine  (CLARITIN ) 10 MG tablet Take 10 mg by mouth daily as needed for allergies.     [provider]  losartan  (COZAAR ) 50 MG tablet Take 50 mg by mouth daily. 08/09/15   [provider]  magnesium  30 MG tablet Take 30 mg by mouth daily. Patient taking differently: Take 400 mg by mouth daily.    [provider]  metoprolol  succinate (TOPROL  XL) 25 MG 24 hr tablet Take 1 tablet (25 mg total) by mouth 2 (two) times daily. 08/07/23   Debera Jayson MATSU, MD  metoprolol  tartrate (LOPRESSOR ) 25 MG tablet Take 25 mg by mouth daily as needed (palpitations).    [provider]  modafinil  (PROVIGIL ) 200 MG tablet Take 1 tablet (200 mg total) by mouth daily. 08/27/23   Dohmeier, Dedra, MD  nitroGLYCERIN  (NITROSTAT ) 0.4 MG SL tablet DISSOLVE 1 TABLET UNDER TONGUE EVERY 5 MINUTES UP TO 15 MIN FOR CHESTPAIN. IF NO RELIEF CALL 911. 05/19/22   Miriam Norris, NP  ONE TOUCH ULTRA TEST test strip TEST BLOOD SUGAR UP TO 4 TIMES DAILY. 10/10/16   Nida, Gebreselassie W, MD  ONETOUCH DELICA LANCETS 33G MISC USE AS DIRECTED TO TEST BLOOD SUGAR 4 TIMES DAILY. 10/10/16   Nida, Gebreselassie W, MD  pravastatin  (PRAVACHOL ) 40 MG tablet TAKE 1 TABLET BY MOUTH ONCE DAILY IN THE EVENING 04/23/23   Debera Jayson MATSU, MD  rOPINIRole  (REQUIP  XL) 4 MG 24 hr tablet Take 1 tablet (4 mg total) by mouth at bedtime. 08/16/23   Dohmeier, Dedra, MD  rOPINIRole  (REQUIP ) 4  MG tablet Take 1 tablet (4 mg total) by mouth at bedtime. 08/16/23   Dohmeier, Dedra, MD  Semaglutide  (OZEMPIC , 2 MG/DOSE, [place]) Inject into the skin.    [provider]  torsemide  (DEMADEX ) 20 MG tablet Take 2 tablets (40 mg total) by mouth 2 (two) times daily. 07/24/23   Johnson, Laymon HERO, PA-C    Allergies: Tape, Farxiga [dapagliflozin], Cardizem  [diltiazem  hcl], Cardura [doxazosin mesylate], Crestor  [rosuvastatin ], Lipitor [atorvastatin ], Paxil [paroxetine hcl], Codeine, and Gabapentin    Review of Systems  Constitutional:  Negative for appetite change, chills and fever.  Respiratory:  Negative for shortness of breath.   Cardiovascular:  Negative for chest pain.  Gastrointestinal:  Positive for diarrhea. Negative for abdominal pain, blood in stool, nausea and vomiting.  Genitourinary:  Negative for dysuria.  Musculoskeletal:  Negative for  back pain and myalgias.  Neurological:  Positive for weakness. Negative for dizziness and headaches.    Updated Vital Signs BP (!) 142/64 (BP Location: Right Arm)   Pulse 91   Temp 98.2 F (36.8 C) (Oral)   Resp 18   Ht 5' 4 (1.626 m)   Wt 98.9 kg   SpO2 97%   BMI 37.42 kg/m   Physical Exam Vitals and nursing note reviewed.  Constitutional:      General: He is not in acute distress.    Appearance: Normal appearance. He is not ill-appearing or toxic-appearing.  HENT:     Mouth/Throat:     Mouth: Mucous membranes are moist.  Cardiovascular:     Rate and Rhythm: Normal rate and regular rhythm.     Pulses: Normal pulses.  Pulmonary:     Effort: Pulmonary effort is normal.  Abdominal:     General: There is no distension.     Palpations: Abdomen is soft. There is no mass.     Tenderness: There is no abdominal tenderness.  Musculoskeletal:        General: Normal range of motion.     Cervical back: Normal range of motion.     Right lower leg: No edema.     Left lower leg: No edema.  Skin:    General: Skin is warm.      Capillary Refill: Capillary refill takes less than 2 seconds.  Neurological:     General: No focal deficit present.     Mental Status: He is alert.     Sensory: No sensory deficit.     Motor: No weakness.     (all labs ordered are listed, but only abnormal results are displayed) Labs Reviewed  COMPREHENSIVE METABOLIC PANEL WITH GFR - Abnormal; Notable for the following components:      Result Value   CO2 21 (*)    Glucose, Bld 188 (*)    BUN 47 (*)    Creatinine, Ser 1.61 (*)    Calcium  8.6 (*)    GFR, Estimated 45 (*)    All other components within normal limits  CBC - Abnormal; Notable for the following components:   RBC 3.76 (*)    Hemoglobin 11.1 (*)    HCT 35.1 (*)    All other components within normal limits  URINALYSIS, ROUTINE W REFLEX MICROSCOPIC - Abnormal; Notable for the following components:   Color, Urine STRAW (*)    All other components within normal limits  C DIFFICILE QUICK SCREEN W PCR REFLEX    GASTROINTESTINAL PANEL BY PCR, STOOL (REPLACES STOOL CULTURE)  LIPASE, BLOOD    EKG: None  Radiology: CT ABDOMEN PELVIS W CONTRAST Result Date: 09/19/2023 CLINICAL DATA:  Lower abdominal pain EXAM: CT ABDOMEN AND PELVIS WITH CONTRAST TECHNIQUE: Multidetector CT imaging of the abdomen and pelvis was performed using the standard protocol following bolus administration of intravenous contrast. RADIATION DOSE REDUCTION: This exam was performed according to the departmental dose-optimization program which includes automated exposure control, adjustment of the mA and/or kV according to patient size and/or use of iterative reconstruction technique. CONTRAST:  80mL OMNIPAQUE  IOHEXOL  300 MG/ML  SOLN COMPARISON:  07/11/2022 FINDINGS: Lower chest: No acute findings.  Aortic atherosclerosis. Hepatobiliary: No focal hepatic abnormality. Gallbladder unremarkable. Pancreas: No focal abnormality or ductal dilatation. Spleen: No focal abnormality.  Normal size. Adrenals/Urinary Tract:  No adrenal abnormality. No focal renal abnormality. No stones or hydronephrosis. Urinary bladder is unremarkable. Stomach/Bowel: Stomach, large and small bowel grossly  unremarkable. Vascular/Lymphatic: Aortic atherosclerosis. No evidence of aneurysm or adenopathy. Reproductive: Prostate enlargement Other: No free fluid or free air. Musculoskeletal: No acute bony abnormality. IMPRESSION: No acute findings in the abdomen or pelvis. Aortic atherosclerosis. Prostate enlargement. Electronically Signed   By: Franky Crease M.D.   On: 09/19/2023 13:31     Procedures   Medications Ordered in the ED  morphine  (PF) 4 MG/ML injection 3 mg (3 mg Intravenous Given 09/19/23 1326)  sodium chloride  0.9 % bolus 500 mL (0 mLs Intravenous Stopped 09/19/23 1514)  iohexol  (OMNIPAQUE ) 300 MG/ML solution 80 mL (80 mLs Intravenous Contrast Given 09/19/23 1230)                                    Medical Decision Making Patient here with reported diarrhea x 4 days.  Recently returned home from the beach and concern he may have eaten something there that caused his diarrhea.  He denies any abdominal pain fever nausea or vomiting.  No recent illness and no recent antibiotic use.  States his stools have been nonbloody or black.  Has controlled the diarrhea somewhat at home using Imodium  Patient does appear slightly dry on exam, he has benign abdominal exam.  Differential would include but not limited to viral process, C. difficile, parasitic infection, acute intra-abdominal infection, gastritis    Amount and/or Complexity of Data Reviewed Labs: ordered.    Details: No significant leukocytosis, chemistries show some renal insufficiency but at baseline.  Lipase unremarkable,  Patient was only able to provide very small stool sample, GI panel and C. difficile PCR were both ordered, but only able to run C. difficile test as sample was insufficient for both Radiology: ordered.    Details: CT abdomen and pelvis without acute  findings Discussion of management or test interpretation with external provider(s): Patient was unable to provide adequate sample for GI panel, C. difficile was negative.  Will have him bring stool sample back to lab for GI panel.  Appears approp for d/c home.  No indication of acute GI bleed.  Feeling better after IV fluids and small dose of pain medication.  Pt to f/u with PCP for recheck.  Will continue over-the-counter Imodium as directed.  Return precautions given  Risk Prescription drug management.        Final diagnoses:  Diarrhea, unspecified type    ED Discharge Orders     None          Herlinda Milling, PA-C 09/23/23 1317    Suzette Pac, MD 09/26/23 1124

## 2023-09-24 ENCOUNTER — Ambulatory Visit (INDEPENDENT_AMBULATORY_CARE_PROVIDER_SITE_OTHER): Admitting: Family Medicine

## 2023-09-24 ENCOUNTER — Encounter: Payer: Self-pay | Admitting: Family Medicine

## 2023-09-24 VITALS — BP 139/62 | HR 84 | Temp 98.4°F | Ht 64.0 in | Wt 220.0 lb

## 2023-09-24 DIAGNOSIS — R55 Syncope and collapse: Secondary | ICD-10-CM | POA: Diagnosis not present

## 2023-09-24 DIAGNOSIS — R197 Diarrhea, unspecified: Secondary | ICD-10-CM

## 2023-09-24 DIAGNOSIS — J069 Acute upper respiratory infection, unspecified: Secondary | ICD-10-CM

## 2023-09-24 NOTE — Progress Notes (Signed)
   Subjective:    Patient ID: Paul Baker, male    DOB: [birth year], 73 y.o.   MRN: [id number]  HPI Very nice patient Here today for follow-up Had severe diarrhea for multiple days Then after that started developing some upper respiratory symptoms Does have chronic chest symptoms of congestion and coughing No vomiting no abdominal pain no dysphagia Has occasional shortness of breath but this is similar to what he has had in the past and has not changed recently He has had frequent loose stools denies blood in just very watery Has not been on antibiotics recently but was on antibiotics earlier this year Her rapid C. difficile test in the ER was negative  Er follow up for diarrhea for 8 days now 4 to5 bm per day Has taken immodium today 2 bm today Has started having a sore throat, headache, along with his chronic cough , no fever Taken tylenol  otc for symptoms Also should be noted that the patient states he has had spells where he feels like he suddenly passes out.  Describes standing or sitting on the couch and then suddenly he will be out of it and his wife will yell at him to wake him up and then he is states able to wake up and be alert there is no reported seizure activity. He does have sleep apnea and has a history of narcolepsy but he states the spells do not seem like his narcolepsy He is also stated there has been a couple times where he is standing at home and suddenly finds himself laying on the ground unsure of what happened other than being told he passed out Review of Systems     Objective:   Physical Exam   General-in no acute distress Eyes-no discharge Lungs-respiratory rate normal, CTA CV-no murmurs,RRR Extremities skin warm dry no edema Neuro grossly normal Behavior normal, alert Abdomen is soft no guarding rebound or tenderness   EKG shows right bundle branch block but no other significant findings Assessment & Plan:  EKG with slight changes compared to  previous Not having any angina No ST segment changes  Patient having what sounds like syncope but could be narcolepsy We will go ahead and order telemetry in addition to this he already has an appointment with cardiology in 10 to 11 days Obviously if he has syncope spells that do not correlate with any type of arrhythmia issues then consideration for narcolepsy and perhaps a stimulant such as Provigil  could be utilized  Upper respiratory virus should gradually get better  Frequent diarrhea needs to have GI panel as well as C. difficile testing

## 2023-09-28 ENCOUNTER — Ambulatory Visit: Payer: Self-pay | Admitting: Family Medicine

## 2023-09-28 DIAGNOSIS — E1165 Type 2 diabetes mellitus with hyperglycemia: Secondary | ICD-10-CM | POA: Diagnosis not present

## 2023-09-30 ENCOUNTER — Emergency Department (HOSPITAL_COMMUNITY)

## 2023-09-30 ENCOUNTER — Other Ambulatory Visit: Payer: Self-pay

## 2023-09-30 ENCOUNTER — Encounter (HOSPITAL_COMMUNITY): Payer: Self-pay | Admitting: Emergency Medicine

## 2023-09-30 ENCOUNTER — Emergency Department (HOSPITAL_COMMUNITY)
Admission: EM | Admit: 2023-09-30 | Discharge: 2023-09-30 | Disposition: A | Discharge status: Home / Self Care | Attending: Emergency Medicine | Admitting: Emergency Medicine

## 2023-09-30 DIAGNOSIS — E871 Hypo-osmolality and hyponatremia: Secondary | ICD-10-CM | POA: Diagnosis not present

## 2023-09-30 DIAGNOSIS — I2510 Atherosclerotic heart disease of native coronary artery without angina pectoris: Secondary | ICD-10-CM | POA: Diagnosis not present

## 2023-09-30 DIAGNOSIS — J208 Acute bronchitis due to other specified organisms: Secondary | ICD-10-CM | POA: Diagnosis not present

## 2023-09-30 DIAGNOSIS — Z85828 Personal history of other malignant neoplasm of skin: Secondary | ICD-10-CM | POA: Insufficient documentation

## 2023-09-30 DIAGNOSIS — R140 Abdominal distension (gaseous): Secondary | ICD-10-CM | POA: Diagnosis not present

## 2023-09-30 DIAGNOSIS — E039 Hypothyroidism, unspecified: Secondary | ICD-10-CM | POA: Diagnosis not present

## 2023-09-30 DIAGNOSIS — R739 Hyperglycemia, unspecified: Secondary | ICD-10-CM | POA: Diagnosis not present

## 2023-09-30 DIAGNOSIS — E1165 Type 2 diabetes mellitus with hyperglycemia: Secondary | ICD-10-CM | POA: Diagnosis not present

## 2023-09-30 DIAGNOSIS — J441 Chronic obstructive pulmonary disease with (acute) exacerbation: Secondary | ICD-10-CM | POA: Insufficient documentation

## 2023-09-30 DIAGNOSIS — Z79899 Other long term (current) drug therapy: Secondary | ICD-10-CM | POA: Insufficient documentation

## 2023-09-30 DIAGNOSIS — R0989 Other specified symptoms and signs involving the circulatory and respiratory systems: Secondary | ICD-10-CM | POA: Diagnosis not present

## 2023-09-30 DIAGNOSIS — I10 Essential (primary) hypertension: Secondary | ICD-10-CM | POA: Diagnosis not present

## 2023-09-30 DIAGNOSIS — R059 Cough, unspecified: Secondary | ICD-10-CM | POA: Diagnosis not present

## 2023-09-30 DIAGNOSIS — Z794 Long term (current) use of insulin: Secondary | ICD-10-CM | POA: Insufficient documentation

## 2023-09-30 LAB — COMPREHENSIVE METABOLIC PANEL WITH GFR
ALT: 17 U/L (ref 0–44)
AST: 11 U/L — ABNORMAL LOW (ref 15–41)
Albumin: 3.3 g/dL — ABNORMAL LOW (ref 3.5–5.0)
Alkaline Phosphatase: 53 U/L (ref 38–126)
Anion gap: 13 (ref 5–15)
BUN: 34 mg/dL — ABNORMAL HIGH (ref 8–23)
CO2: 22 mmol/L (ref 22–32)
Calcium: 8.8 mg/dL — ABNORMAL LOW (ref 8.9–10.3)
Chloride: 99 mmol/L (ref 98–111)
Creatinine, Ser: 1.35 mg/dL — ABNORMAL HIGH (ref 0.61–1.24)
GFR, Estimated: 56 mL/min — ABNORMAL LOW (ref >60)
Glucose, Bld: 306 mg/dL — ABNORMAL HIGH (ref 70–99)
Potassium: 3.8 mmol/L (ref 3.5–5.1)
Sodium: 134 mmol/L — ABNORMAL LOW (ref 135–145)
Total Bilirubin: 0.5 mg/dL (ref 0.0–1.2)
Total Protein: 7.2 g/dL (ref 6.5–8.1)

## 2023-09-30 LAB — CBC
HCT: 31.7 % — ABNORMAL LOW (ref 39.0–52.0)
Hemoglobin: 10.4 g/dL — ABNORMAL LOW (ref 13.0–17.0)
MCH: 30.1 pg (ref 26.0–34.0)
MCHC: 32.8 g/dL (ref 30.0–36.0)
MCV: 91.6 fL (ref 80.0–100.0)
Platelets: 249 K/uL (ref 150–400)
RBC: 3.46 MIL/uL — ABNORMAL LOW (ref 4.22–5.81)
RDW: 14.2 % (ref 11.5–15.5)
WBC: 9.9 K/uL (ref 4.0–10.5)
nRBC: 0 % (ref 0.0–0.2)

## 2023-09-30 LAB — BLOOD GAS, VENOUS
Acid-base deficit: 0.2 mmol/L (ref 0.0–2.0)
Bicarbonate: 25.4 mmol/L (ref 20.0–28.0)
Drawn by: 51244
O2 Saturation: 51.3 %
Patient temperature: 36.8
pCO2, Ven: 44 mmHg (ref 44–60)
pH, Ven: 7.37 (ref 7.25–7.43)
pO2, Ven: <31 mmHg — CL (ref 32–45)

## 2023-09-30 LAB — C DIFFICILE TOXINS A+B W/RFLX: C difficile Toxins A+B, EIA: NEGATIVE

## 2023-09-30 LAB — BRAIN NATRIURETIC PEPTIDE: B Natriuretic Peptide: 45 pg/mL (ref 0.0–100.0)

## 2023-09-30 LAB — URINALYSIS, ROUTINE W REFLEX MICROSCOPIC
Bacteria, UA: NONE SEEN
Bilirubin Urine: NEGATIVE
Glucose, UA: 50 mg/dL — AB
Ketones, ur: NEGATIVE mg/dL
Leukocytes,Ua: NEGATIVE
Nitrite: NEGATIVE
Protein, ur: NEGATIVE mg/dL
Specific Gravity, Urine: 1.008 (ref 1.005–1.030)
pH: 5 (ref 5.0–8.0)

## 2023-09-30 LAB — C DIFFICILE, CYTOTOXIN B

## 2023-09-30 LAB — RESP PANEL BY RT-PCR (RSV, FLU A&B, COVID)  RVPGX2
Influenza A by PCR: NEGATIVE
Influenza B by PCR: NEGATIVE
Resp Syncytial Virus by PCR: NEGATIVE
SARS Coronavirus 2 by RT PCR: NEGATIVE

## 2023-09-30 LAB — CBG MONITORING, ED
Glucose-Capillary: 323 mg/dL — ABNORMAL HIGH (ref 70–99)
Glucose-Capillary: 206 mg/dL — ABNORMAL HIGH (ref 70–99)

## 2023-09-30 LAB — BETA-HYDROXYBUTYRIC ACID: Beta-Hydroxybutyric Acid: <0.05 mmol/L — ABNORMAL LOW (ref 0.05–0.27)

## 2023-09-30 MED ORDER — IPRATROPIUM-ALBUTEROL 0.5-2.5 (3) MG/3ML IN SOLN
3.0000 mL | Freq: Once | RESPIRATORY_TRACT | Status: AC
  Administered 2023-09-30: 3 mL via RESPIRATORY_TRACT
  Filled 2023-09-30: qty 3

## 2023-09-30 MED ORDER — LACTATED RINGERS IV BOLUS
1000.0000 mL | Freq: Once | INTRAVENOUS | Status: AC
  Administered 2023-09-30: 1000 mL via INTRAVENOUS

## 2023-09-30 MED ORDER — DOXYCYCLINE HYCLATE 100 MG PO CAPS: 100.0000 mg | ORAL_CAPSULE | Freq: Two times a day (BID) | ORAL | 0 refills | Status: AC

## 2023-09-30 MED ORDER — AZITHROMYCIN 250 MG PO TABS: 250.0000 mg | ORAL_TABLET | Freq: Every day | ORAL | 0 refills | Status: DC

## 2023-09-30 MED ORDER — PREDNISONE 10 MG PO TABS: 20.0000 mg | ORAL_TABLET | Freq: Every day | ORAL | 0 refills | Status: AC

## 2023-09-30 NOTE — ED Triage Notes (Signed)
 Pt presents to the ED w/ hyperglycemia x2 days in the 500s. Pt took 38 U of home insulin  this morning. States he thinks he has pneumonia d/t increased SOB and chest congestion. Cbg is 323 in triage. Pt also had diarrhea x2 weeks that stopped on Friday.

## 2023-09-30 NOTE — ED Provider Notes (Signed)
 [place] EMERGENCY DEPARTMENT AT [facility] Provider Note   CSN: [id number] Arrival date & time: 09/30/23  9078     History  Chief Complaint  Patient presents with   Hyperglycemia    Paul Baker is a 73 y.o. male with PMH as listed below who presents with Pt presents to the ED w/ hyperglycemia x2 days in the 500s. Pt took 38 U of home insulin  this morning and per his monitor, sugar is now in the 300s. This is unusual for him. No recent changes in diabetes meds or missed doses. States he thinks he has pneumonia d/t increased SOB and chest congestion. Greatest complaint is cough that is worse at night, productive of yellow sputum. No f/c, abdominal pain, nausea/vomiting, leg swelling. Endorses chest pain with coughing and soreness. Cbg is 323 in triage. Pt also had diarrhea x2 weeks that stopped on Friday. He does endorse wheezing as well. He has h/o COPD and uses a trelegy ellipta  inhaler. Doesn't use his albuterol  inhaler much. Has been using mucinex  for the cough as well.    Past Medical History:  Diagnosis Date   Arthritis    Asthma    Atrial fibrillation [facility])    Diagnosed December 2021   Colon polyp    Coronary atherosclerosis of native coronary artery    a. 2011: cath showing 90% stenosis along small non-dominant RCA (too small for PCI). b. 01/2018: cath showing nonobstructive CAD with 60 to 70% proximal to mid nondominant RCA stenosis, 50% mid LAD and 40 to 50% OM1   DVT (deep venous thrombosis) (HCC) 2005   Right arm   Essential hypertension    Headache    History of transfusion    Hypothyroidism    Mixed hyperlipidemia    Morbid obesity (HCC)    MRSA (methicillin resistant staph aureus) culture positive    08/2012   Narcolepsy    OSA (obstructive sleep apnea)    CPAP   Osteoarthritis    Pneumonia    Psoriasis    Pulmonary embolism (HCC) 2004   RLS (restless legs syndrome)    Rotator cuff disorder    Left   Septic arthritis of knee, left (HCC)     Skin cancer, basal cell    Type 2 diabetes mellitus (HCC)        Home Medications Prior to Admission medications   Medication Sig Start Date End Date Taking? Authorizing Provider  doxycycline  (VIBRAMYCIN ) 100 MG capsule Take 1 capsule (100 mg total) by mouth 2 (two) times daily for 5 days. 09/30/23 10/05/23 Yes Franklyn Sid SAILOR, MD  predniSONE  (DELTASONE ) 10 MG tablet Take 2 tablets (20 mg total) by mouth daily for 5 days. 09/30/23 10/05/23 Yes Franklyn Sid SAILOR, MD  acetaminophen  (TYLENOL ) 325 MG tablet Take 2 tablets (650 mg total) by mouth every 6 (six) hours as needed for mild pain (or Fever >/= 101). 12/05/19   Pearlean Manus, MD  albuterol  (PROVENTIL ) (2.5 MG/3ML) 0.083% nebulizer solution Take 3 mLs (2.5 mg total) by nebulization every 6 (six) hours as needed for up to 7 days for wheezing or shortness of breath. 07/14/22   Pearlean Manus, MD  albuterol  (VENTOLIN  HFA) 108 (90 Base) MCG/ACT inhaler Inhale 2 puffs into the lungs every 6 (six) hours as needed for wheezing or shortness of breath. 07/14/22   Pearlean Manus, MD  alfuzosin  (UROXATRAL ) 10 MG 24 hr tablet Take 10 mg by mouth daily. 03/02/21   [provider]  Alpha Lipoic Acid -Biotin   ER 600-450 MG-MCG TBCR Take 600 mg by mouth every morning. 05/04/22   Dohmeier, Dedra, MD  benzonatate  (TESSALON ) 200 MG capsule Take 1 capsule (200 mg total) by mouth 3 (three) times daily as needed. Patient not taking: Reported on 10/02/2023 06/26/23 06/25/24  Orlie Madelin RAMAN, NP  clobetasol  cream (TEMOVATE ) 0.05 % Apply thin amount 2 times daily prn to psoriasis not for face axilla or groin 05/26/22   Alphonsa Glendia LABOR, MD  Continuous Glucose Sensor (DEXCOM G7 SENSOR) MISC Change sensor every 10 days 05/22/22   Therisa Benton PARAS, NP  Cyanocobalamin  (VITAMIN B 12 PO) Take 5,000 mcg by mouth daily.    [provider]  dabigatran  (PRADAXA ) 150 MG CAPS capsule Take 1 capsule (150 mg total) by mouth 2 (two) times daily. 07/13/23   Debera Jayson MATSU, MD  desonide  (DESOWEN ) 0.05 % cream Apply once daily prn to facial rash 11/22/22   Alphonsa Glendia LABOR, MD  dextromethorphan -guaiFENesin  (MUCINEX  DM) 30-600 MG 12hr tablet Take 1 tablet by mouth 2 (two) times daily. 07/14/22   Pearlean Manus, MD  ezetimibe  (ZETIA ) 10 MG tablet Take 10 mg by mouth daily. 01/11/23   [provider]  FIASP  FLEXTOUCH 100 UNIT/ML FlexTouch Pen Inject 30-36 Units into the skin in the morning, at noon, and at bedtime. 06/20/22   [provider]  Finerenone (KERENDIA) 10 MG TABS Take 1 tablet (10 mg total) by mouth daily. 10/02/23   Debera Jayson MATSU, MD  fluticasone  (FLONASE ) 50 MCG/ACT nasal spray Place 1 spray into both nostrils daily. 06/27/22   Sood, Vineet, MD  Fluticasone -Umeclidin-Vilant (TRELEGY ELLIPTA ) 100-62.5-25 MCG/ACT AEPB Inhale 1 puff into the lungs daily. 08/14/23   Parrett, Madelin RAMAN, NP  Glucagon  (GVOKE HYPOPEN  2-PACK) 1 MG/0.2ML SOAJ Inject 1 mg subcutaneously once as needed for low blood sugar. May repeat dose in 15 minutes as needed using a new device. Patient not taking: Reported on 10/02/2023 01/28/21   Alphonsa Glendia LABOR, MD  insulin  degludec (TRESIBA  FLEXTOUCH) 200 UNIT/ML FlexTouch Pen Inject 120 Units into the skin at bedtime. 09/07/22   Therisa Benton PARAS, NP  Insulin  Pen Needle (B-D ULTRAFINE III SHORT PEN) 31G X 8 MM MISC USE 1 PEN NEEDLE THREE TIMES DAILY 01/19/22   Therisa Benton PARAS, NP  isosorbide  mononitrate (IMDUR ) 30 MG 24 hr tablet Take 1 tablet (30 mg total) by mouth 2 (two) times daily. Patient taking differently: Take 30 mg by mouth daily. 05/23/23   Furth, Cadence H, PA-C  levothyroxine  (SYNTHROID ) 137 MCG tablet Take 1 tablet (137 mcg total) by mouth daily before breakfast. 08/09/23   Therisa Benton PARAS, NP  loratadine  (CLARITIN ) 10 MG tablet Take 10 mg by mouth daily as needed for allergies.     [provider]  losartan  (COZAAR ) 50 MG tablet Take 50 mg by mouth daily. 08/09/15   [provider]  magnesium   30 MG tablet Take 30 mg by mouth daily. Patient not taking: Reported on 10/02/2023    [provider]  metoprolol  succinate (TOPROL  XL) 25 MG 24 hr tablet Take 1 tablet (25 mg total) by mouth 2 (two) times daily. 08/07/23   Debera Jayson MATSU, MD  metoprolol  tartrate (LOPRESSOR ) 25 MG tablet Take 25 mg by mouth daily as needed (palpitations).    [provider]  modafinil  (PROVIGIL ) 200 MG tablet Take 1 tablet (200 mg total) by mouth daily. 08/27/23   Dohmeier, Dedra, MD  nitroGLYCERIN  (NITROSTAT ) 0.4 MG SL tablet DISSOLVE 1 TABLET UNDER TONGUE  EVERY 5 MINUTES UP TO 15 MIN FOR CHESTPAIN. IF NO RELIEF CALL 911. 05/19/22   Miriam Norris, NP  ONE TOUCH ULTRA TEST test strip TEST BLOOD SUGAR UP TO 4 TIMES DAILY. 10/10/16   Nida, Gebreselassie W, MD  ONETOUCH DELICA LANCETS 33G MISC USE AS DIRECTED TO TEST BLOOD SUGAR 4 TIMES DAILY. 10/10/16   Nida, Gebreselassie W, MD  pravastatin  (PRAVACHOL ) 40 MG tablet TAKE 1 TABLET BY MOUTH ONCE DAILY IN THE EVENING 04/23/23   Debera Jayson MATSU, MD  rOPINIRole  (REQUIP  XL) 4 MG 24 hr tablet Take 1 tablet (4 mg total) by mouth at bedtime. 08/16/23   Dohmeier, Dedra, MD  rOPINIRole  (REQUIP ) 4 MG tablet Take 1 tablet (4 mg total) by mouth at bedtime. 08/16/23   Dohmeier, Dedra, MD  Semaglutide  (OZEMPIC , 2 MG/DOSE, Ler[place]) Inject into the skin.    [provider]  torsemide  (DEMADEX ) 20 MG tablet Take 2 tablets (40 mg total) by mouth 2 (two) times daily. 07/24/23   Johnson, Laymon HERO, PA-C      Allergies    Tape, Farxiga [dapagliflozin], Cardizem  [diltiazem  hcl], Cardura [doxazosin mesylate], Crestor  [rosuvastatin ], Lipitor [atorvastatin ], Paxil [paroxetine hcl], Codeine, and Gabapentin    Review of Systems   Review of Systems A 10 point review of systems was performed and is negative unless otherwise reported in HPI.  Physical Exam Updated Vital Signs BP 131/74   Pulse 66   Temp 98 F (36.7 C) (Oral)   Resp 20   Ht 5' 4 (1.626 m)   Wt 99  kg   SpO2 93%   BMI 37.46 kg/m  Physical Exam General: Normal appearing elderly male, lying in bed.  HEENT: Sclera anicteric, MMM, trachea midline.  Cardiology: RRR, no murmurs/rubs/gallops.  Resp: Frequent coughing. Mild expiratory wheezing. Normal respiratory rate and effort. no rhonchi, crackles.  Abd: Soft, non-tender, non-distended. No rebound tenderness or guarding.  GU: Deferred. MSK: No peripheral edema or signs of trauma. Extremities without deformity or TTP. No cyanosis or clubbing. Skin: warm, dry.  Neuro: A&Ox4, CNs II-XII grossly intact. MAEs. Sensation grossly intact.  Psych: Normal mood and affect.   ED Results / Procedures / Treatments   Labs (all labs ordered are listed, but only abnormal results are displayed) Labs Reviewed  CBC - Abnormal; Notable for the following components:      Result Value   RBC 3.46 (*)    Hemoglobin 10.4 (*)    HCT 31.7 (*)    All other components within normal limits  URINALYSIS, ROUTINE W REFLEX MICROSCOPIC - Abnormal; Notable for the following components:   Color, Urine STRAW (*)    Glucose, UA 50 (*)    Hgb urine dipstick SMALL (*)    All other components within normal limits  COMPREHENSIVE METABOLIC PANEL WITH GFR - Abnormal; Notable for the following components:   Sodium 134 (*)    Glucose, Bld 306 (*)    BUN 34 (*)    Creatinine, Ser 1.35 (*)    Calcium  8.8 (*)    Albumin 3.3 (*)    AST 11 (*)    GFR, Estimated 56 (*)    All other components within normal limits  BETA-HYDROXYBUTYRIC ACID - Abnormal; Notable for the following components:   Beta-Hydroxybutyric Acid <0.05 (*)    All other components within normal limits  BLOOD GAS, VENOUS - Abnormal; Notable for the following components:   pO2, Ven <31 (*)    All other components within normal limits  CBG MONITORING,  ED - Abnormal; Notable for the following components:   Glucose-Capillary 323 (*)    All other components within normal limits  CBG MONITORING, ED -  Abnormal; Notable for the following components:   Glucose-Capillary 206 (*)    All other components within normal limits  RESP PANEL BY RT-PCR (RSV, FLU A&B, COVID)  [id number]  BRAIN NATRIURETIC PEPTIDE  CBG MONITORING, ED    EKG None  Radiology CXR: No acute cardiopulmonary abnormality.   Procedures Procedures    Medications Ordered in ED Medications  lactated ringers  bolus 1,000 mL (0 mLs Intravenous Stopped 09/30/23 1322)  ipratropium-albuterol  (DUONEB) 0.5-2.5 (3) MG/3ML nebulizer solution 3 mL (3 mLs Nebulization Given 09/30/23 1136)    ED Course/ Medical Decision Making/ A&P                          Medical Decision Making Amount and/or Complexity of Data Reviewed Labs: ordered. Decision-making details documented in ED Course. Radiology: ordered. Decision-making details documented in ED Course.  Risk Prescription drug management.    This patient presents to the ED for concern of cough, SOB, and hyperglycemia, this involves an extensive number of treatment options, and is a complaint that carries with it a high risk of complications and morbidity.  I considered the following differential and admission for this acute, potentially life threatening condition.   MDM:    Pt with productive cough, increased wheezing, and SOB. He also has high blood sugars. Reassuringly his CXR w/ no PNA or pulm edema/pleural effusion. No increased leg swelling or elevated BNP to indicate CHF exacerbation. Does have mild expiratory wheezing on exam that improves after a nebulizer treatment. Considered PE but less likely. Patient likely w/ acute bronchitis or lower viral respiratory illness that may have caused a COPD exacerbation. He has no respiratory distress or hypoxic resp failure. Believe patient is stable for o/p f/u .  For his high blood sugars, no indication of DKA. Patient is given 1L IVF and sugar improves to 206 mg/dL. Likely his acute infection is causing hyperglycemia. Had risk  benefit discussion with patient and his wife about prednisone  and blood sugars. Discussed that steroids can increase blood sugar and blood sugar variability but that he may benefit from respiratory standpoint by taking prednisone . After shared decision making, patient would like to take prednisone  for COPD exacerbation/bronchitis and keep close eye on his blood sugar. Will prescribe 40 mg every day prednisone  x 5 days, azithromycin  x 5 days, and instructed him to schedule his albuterol  inhaler q4h x 3 days and as needed after that. Instructed him to keep close eye on CBG and discussed when to return to the emergency department. Advised f/u with PCP within 1-2 weeks.    Clinical Course as of 10/02/23 1352  Sun Sep 30, 2023  0943 Glucose-Capillary(!): 323 [HN]  1117 pH, Ven: 7.37 No acidosis [HN]  1118 Glucose(!): 306 +hyperglycemia [HN]  1118 Creatinine(!): 1.35 C/w baseline [HN]  1118 Beta-Hydroxybutyric Acid(!): <0.05 negative [HN]  1118 Resp panel by RT-PCR (RSV, Flu A&B, Covid) Anterior Nasal Swab negative [HN]  1118 DG Chest Portable 1 View No acute cardiopulmonary abnormality. [HN]  1209 Urinalysis, Routine w reflex microscopic -Urine, Clean Catch(!) No UTI [HN]  1222 B Natriuretic Peptide: 45.0 wnl [HN]  1344 Glucose-Capillary(!): 206 [HN]    Clinical Course User Index [HN] Franklyn Sid SAILOR, MD    Labs: I Ordered, and personally interpreted labs.  The pertinent results include:  those  listed above  Imaging Studies ordered: I ordered imaging studies including CXR I independently visualized and interpreted imaging. I agree with the radiologist interpretation  Additional history obtained from chart review, wife at bedside.    Reevaluation: After the interventions noted above, I reevaluated the patient and found that they have :improved  Social Determinants of Health: Lives independently  Disposition:  DC w/ discharge instructions/return precautions. All questions  answered to patient's satisfaction.    Co morbidities that complicate the patient evaluation  Past Medical History:  Diagnosis Date   Arthritis    Asthma    Atrial fibrillation [facility])    Diagnosed December 2021   Colon polyp    Coronary atherosclerosis of native coronary artery    a. 2011: cath showing 90% stenosis along small non-dominant RCA (too small for PCI). b. 01/2018: cath showing nonobstructive CAD with 60 to 70% proximal to mid nondominant RCA stenosis, 50% mid LAD and 40 to 50% OM1   DVT (deep venous thrombosis) (HCC) 2005   Right arm   Essential hypertension    Headache    History of transfusion    Hypothyroidism    Mixed hyperlipidemia    Morbid obesity (HCC)    MRSA (methicillin resistant staph aureus) culture positive    08/2012   Narcolepsy    OSA (obstructive sleep apnea)    CPAP   Osteoarthritis    Pneumonia    Psoriasis    Pulmonary embolism (HCC) 2004   RLS (restless legs syndrome)    Rotator cuff disorder    Left   Septic arthritis of knee, left (HCC)    Skin cancer, basal cell    Type 2 diabetes mellitus (HCC)      Medicines Meds ordered this encounter  Medications   lactated ringers  bolus 1,000 mL   ipratropium-albuterol  (DUONEB) 0.5-2.5 (3) MG/3ML nebulizer solution 3 mL   predniSONE  (DELTASONE ) 10 MG tablet    Sig: Take 2 tablets (20 mg total) by mouth daily for 5 days.    Dispense:  10 tablet    Refill:  0   DISCONTD: azithromycin  (ZITHROMAX ) 250 MG tablet    Sig: Take 1 tablet (250 mg total) by mouth daily. Take first 2 tablets together, then 1 every day until finished.    Dispense:  6 tablet    Refill:  0   doxycycline  (VIBRAMYCIN ) 100 MG capsule    Sig: Take 1 capsule (100 mg total) by mouth 2 (two) times daily for 5 days.    Dispense:  10 capsule    Refill:  0    I have reviewed the patients home medicines and have made adjustments as needed  Problem List / ED Course: Problem List Items Addressed This Visit   None Visit  Diagnoses       Hyperglycemia    -  Primary     Acute viral bronchitis         COPD exacerbation (HCC)       Relevant Medications   ipratropium-albuterol  (DUONEB) 0.5-2.5 (3) MG/3ML nebulizer solution 3 mL (Completed)   predniSONE  (DELTASONE ) 10 MG tablet                   This note was created using dictation software, which may contain spelling or grammatical errors.    Franklyn Sid SAILOR, MD 10/09/23 2142

## 2023-09-30 NOTE — ED Notes (Signed)
 Pt unable to provide urine sample at this time

## 2023-09-30 NOTE — Discharge Instructions (Addendum)
 Thank you for coming to [facility] Emergency Department. You were seen for cough and high blood sugar. Likely you are having a viral bronchitis and COPD exacerbation. Your sugar is likely high as a result of this. You were given fluids and your sugar came down. Your symptoms improved with a nebulizer. Please use your albuterol  inhaler every 4 hours scheduled for the next 3 days. Then you can use it as needed after that for wheezing or shortness of breath. Please take prednisone  40 mg once per day for 5 days and doxycycline  100 mg twice per day for 5 days. Be aware that the prednisone  can increase your blood sugar, so as we talked about, keep a close eye on your blood sugar and be sure to stay well hydrated.   Please follow up with your primary care provider within 1 week.   Do not hesitate to return to the ED or call 911 if you experience: -Worsening symptoms -Shortness of breath -Stroke-like symptoms including slurred speech, confusion, asymmetric numbness weakness or tingling, dizziness -Lightheadedness, passing out -Fevers/chills -Anything else that concerns you

## 2023-10-02 ENCOUNTER — Ambulatory Visit: Attending: Cardiology

## 2023-10-02 ENCOUNTER — Encounter: Payer: Self-pay | Admitting: Cardiology

## 2023-10-02 ENCOUNTER — Ambulatory Visit: Attending: Cardiology | Admitting: Cardiology

## 2023-10-02 VITALS — BP 130/62 | HR 78 | Ht 65.0 in | Wt 221.6 lb

## 2023-10-02 DIAGNOSIS — I480 Paroxysmal atrial fibrillation: Secondary | ICD-10-CM

## 2023-10-02 DIAGNOSIS — E782 Mixed hyperlipidemia: Secondary | ICD-10-CM

## 2023-10-02 DIAGNOSIS — Z79899 Other long term (current) drug therapy: Secondary | ICD-10-CM | POA: Diagnosis not present

## 2023-10-02 DIAGNOSIS — I5032 Chronic diastolic (congestive) heart failure: Secondary | ICD-10-CM

## 2023-10-02 DIAGNOSIS — R0609 Other forms of dyspnea: Secondary | ICD-10-CM | POA: Diagnosis not present

## 2023-10-02 DIAGNOSIS — R55 Syncope and collapse: Secondary | ICD-10-CM

## 2023-10-02 DIAGNOSIS — I25119 Atherosclerotic heart disease of native coronary artery with unspecified angina pectoris: Secondary | ICD-10-CM | POA: Diagnosis not present

## 2023-10-02 DIAGNOSIS — N1831 Chronic kidney disease, stage 3a: Secondary | ICD-10-CM

## 2023-10-02 LAB — CDIFF NAA+O+P+STOOL CULTURE
E coli, Shiga toxin Assay: NEGATIVE
Toxigenic C. Difficile by PCR: NEGATIVE

## 2023-10-02 MED ORDER — KERENDIA 10 MG PO TABS: 10.0000 mg | ORAL_TABLET | Freq: Every day | ORAL | 2 refills | Status: AC

## 2023-10-02 MED ORDER — KERENDIA 20 MG PO TABS: 20.0000 mg | ORAL_TABLET | Freq: Every day | ORAL | 3 refills | Status: DC

## 2023-10-02 NOTE — Patient Instructions (Addendum)
 Medication Instructions:   START Kerendia 20 mg daily  Addendum 10:14 am Dr.McDowell has lowered to Kerendia dose to 10 mg daily, [facility] pharmacist made aware, left message for patient on home number as cell voicemail is full.  Labwork: BMET in 2 weeks ( 10/16/23)  Testing/Procedures: GEOFFRY HEWS- Long Term Monitor Instructions   Your physician has requested you wear your ZIO patch monitor____14___days.   This is a single patch monitor.  [employer] supplies one patch monitor per enrollment.  Additional stickers are not available.   Do not shower for the first 24 hours.  You may shower after the first 24 hours.   Press button if you feel a symptom. You will hear a small click.  Record Date, Time and Symptom in the Patient Log Book.   When you are ready to remove patch, follow instructions on last 2 pages of Patient Log Book.  Stick patch monitor onto last page of Patient Log Book.   Place Patient Log Book in [place] box.  Use locking tab on box and tape box closed securely.  The [employer] and [employer] has [employer] on it.  Please place in mailbox as soon as possible.  Your physician should have your test results approximately 7 days after the monitor has been mailed back to [facility].   Call [facility] Customer Care at [phone] if you have questions regarding your ZIO XT patch monitor.  Call them immediately if you see an orange light blinking on your monitor.   If your monitor falls off in less than 4 days contact our Monitor department at [phone].  If your monitor becomes loose or falls off after 4 days call [employer] at [phone] for suggestions on securing your monitor.   Follow-Up: 6 weeks   Any Other Special Instructions Will Be Listed Below (If Applicable).  If you need a refill on your cardiac medications before your next appointment, please call your pharmacy.

## 2023-10-02 NOTE — Addendum Note (Signed)
 Addended by: Latrina Guttman A on: 10/02/2023 10:17 AM   Modules accepted: Orders

## 2023-10-02 NOTE — Progress Notes (Addendum)
 Cardiology Office Note  Date: 10/02/2023   ID: MARQUESE BURKLAND, DOB June 17, 1950, MRN [id number]  History of Present Illness: Paul Baker is a 73 y.o. male last seen in June by Ms. Strader PA-C.  I reviewed the chart and we discussed his symptoms.  He reports recurring cough and chest congestion associated with dyspnea on exertion at NYHA class II-III, intermittent feeling of chest tightness.  Stated that he was treated for pneumonia several weeks ago and still being managed by Pulmonary for asthmatic bronchitis.    He had a cardiac workup in June at which point coronary anatomy was stable with 70% small nondominant RCA stenosis and increased left heart and PA pressures.  Report stated severe elevation in right heart filling pressures, however PA systolic and RV systolic were 41 mmHg (mildly to moderately elevated).  Likely WHO group 2 and 3.  Demadex  dosing was increased after his cardiac catheterization, however he developed acute on chronic kidney injury with creatinine up to 2.04, more recently down to 1.35 with GFR 56 consistent with CKD stage IIIa.  He states that his diabetes has not been as well-controlled, also complicated by steroid use.  We went over his medications.  Currently he is taking Demadex  40 mg twice daily.  On Ozempic  as well.  Had prior intolerance of Doreen (reported yeast infection and did not feel well on the medication).  Unrelated to the above symptoms, he also reports recurring episodes of possible syncope, although has known diagnosis of narcolepsy.  States that he will suddenly have a brief episode of blacking out versus sleeping, can occur while he is standing or seated.  He did discuss the symptoms with Dr. Alphonsa at their last visit.  Physical Exam: VS:  BP 130/62 (BP Location: Right Arm, Cuff Size: Large)   Pulse 78   Ht 5' 5 (1.651 m)   Wt 221 lb 9.6 oz (100.5 kg)   SpO2 92%   BMI 36.88 kg/m , BMI Body mass index is 36.88 kg/m.  Wt Readings  from Last 3 Encounters:  10/02/23 221 lb 9.6 oz (100.5 kg)  09/30/23 218 lb 4.1 oz (99 kg)  09/24/23 220 lb (99.8 kg)    General: Patient appears comfortable at rest. HEENT: Conjunctiva and lids normal. Neck: Supple, no elevated JVP or carotid bruits. Lungs: Coarse breath sounds with upper airway congestion, no wheezing. Cardiac: Regular rate and rhythm, no S3 or significant systolic murmur, no pericardial rub. Abdomen: Soft, bowel sounds present. Extremities: Trace ankle edema, distal pulses 2+.  ECG:  An ECG dated 06/19/2023 was personally reviewed today and demonstrated:  Sinus rhythm with Q-wave in lead III.  Labwork: 07/09/2023: TSH 1.960 09/30/2023: ALT 17; AST 11; B Natriuretic Peptide 45.0; BUN 34; Creatinine, Ser 1.35; Hemoglobin 10.4; Platelets 249; Potassium 3.8; Sodium 134     Component Value Date/Time   CHOL 126 04/26/2023 0845   TRIG 281 (H) 04/26/2023 0845   HDL 36 (L) 04/26/2023 0845   CHOLHDL 3.5 04/26/2023 0845   CHOLHDL 3.2 08/05/2019 0334   VLDL 19 08/05/2019 0334   LDLCALC 47 04/26/2023 0845   LDLCALC 118 (H) 02/06/2019 0813   Other Studies Reviewed Today:  Echocardiogram 05/17/2023:  1. Left ventricular ejection fraction, by estimation, is 60 to 65%. The  left ventricle has normal function. The left ventricle has no regional  wall motion abnormalities. Left ventricular diastolic parameters are  consistent with Grade I diastolic  dysfunction (impaired relaxation).   2. Right ventricular systolic  function is normal. The right ventricular  size is normal.   3. The mitral valve is normal in structure. No evidence of mitral valve  regurgitation. No evidence of mitral stenosis.   4. The aortic valve is tricuspid. Aortic valve regurgitation is trivial.  No aortic stenosis is present.   5. The inferior vena cava is normal in size with greater than 50%  respiratory variability, suggesting right atrial pressure of 3 mmHg.   Left and right heart catheterization  07/02/2023: Conclusions: Stable appearance of the coronary arteries since cath in 2020 with 70% stenosis of non-dominant RCA and mild-moderate, non-obstructive left coronary artery disease of up to 40%. Mildly to moderately elevated left heart and pulmonary artery pressures. Severely elevated right heart filling pressures. Normal Fick cardiac output/index.  Assessment and Plan:  1.  CAD, cardiac catheterization in June showed stable 70% stenosis involving a small nondominant RCA with otherwise mild to moderate left system disease that was nonobstructive.  He does report intermittent angina, possibly reflecting microvascular disease in the setting of type 2 diabetes mellitus, some of his symptoms may also be more related to fluid overload intermittently.  No indication for revascularization however based on his most recent angiogram and plans to continue medical therapy.  He is not on aspirin  given use of Pradaxa .  Continue Zetia  10 mg daily, Imdur  30 mg daily, Pravachol  40 mg daily, Ozempic  2 mg weekly as part of diabetic regimen, and as needed nitroglycerin .  2.  HFpEF.  LVEF 60 to 65% by echocardiogram in May and RV contraction normal.  Right heart catheterization in June showed PA systolic pressure 41 mmHg with LVEDP 22 mmHg.  Continue Demadex  40 mg twice daily.  He did not tolerate [place] previously as discussed above.  Plan to initiate Kerendia 10 mg daily particular in light of concurrent CKD stage IIIa and diabetes mellitus.  Check BMET in 2 weeks.  Suspect that his dyspnea on exertion is multifactorial however and complicated by his asthmatic bronchitis.  Elevated pulmonary pressures are likely WHO group 2 and 3.  3.  Episodes of possible recurring syncope although with known diagnosis of narcolepsy.  We will plan a 14-day ZIO monitor to investigate for any potential associated arrhythmias.  4.  Paroxysmal atrial fibrillation with CHA2DS2-VASc score of 4.  No reported palpitations.  Continue  Toprol -XL 25 mg twice daily and Pradaxa  150 mg twice daily.  5.  Primary hypertension.  Continue Cozaar  50 mg daily.  5.  Mixed hyperlipidemia.  LDL 47 in April.  Continue Pravachol  40 mg daily and Zetia  10 mg daily.  6.  CKD stage IIIa.  Recent creatinine 1.35 with GFR 56.  Disposition:  Follow up 6 weeks.  Signed, Jayson JUDITHANN Sierras, M.D., F.A.C.C.  [facility] at [facility]

## 2023-10-08 ENCOUNTER — Other Ambulatory Visit: Payer: Self-pay | Admitting: Cardiology

## 2023-10-16 ENCOUNTER — Telehealth: Payer: Self-pay | Admitting: Nurse Practitioner

## 2023-10-16 NOTE — Telephone Encounter (Signed)
 Pt picked up PAP of ozempic 

## 2023-10-16 NOTE — Telephone Encounter (Signed)
 Let pt know PAP of ozempic  is ready for pick up

## 2023-10-17 ENCOUNTER — Telehealth: Payer: Self-pay | Admitting: Nurse Practitioner

## 2023-10-17 ENCOUNTER — Ambulatory Visit: Payer: Self-pay | Admitting: Cardiology

## 2023-10-17 DIAGNOSIS — Z79899 Other long term (current) drug therapy: Secondary | ICD-10-CM

## 2023-10-17 LAB — BASIC METABOLIC PANEL WITH GFR
BUN/Creatinine Ratio: 28 — ABNORMAL HIGH (ref 10–24)
BUN: 43 mg/dL — ABNORMAL HIGH (ref 8–27)
CO2: 21 mmol/L (ref 20–29)
Calcium: 9.1 mg/dL (ref 8.6–10.2)
Chloride: 102 mmol/L (ref 96–106)
Creatinine, Ser: 1.51 mg/dL — ABNORMAL HIGH (ref 0.76–1.27)
Glucose: 237 mg/dL — ABNORMAL HIGH (ref 70–99)
Potassium: 5.3 mmol/L — ABNORMAL HIGH (ref 3.5–5.2)
Sodium: 138 mmol/L (ref 134–144)
eGFR: 49 mL/min/1.73 — ABNORMAL LOW (ref >59)

## 2023-10-17 NOTE — Telephone Encounter (Signed)
 Let pt know PAP of tresiba  is ready for pick up

## 2023-10-17 NOTE — Telephone Encounter (Signed)
 The patient has been notified of the result and verbalized understanding.  All questions (if any) were answered. Bernett Dorothyann LABOR, RN 10/17/2023 8:37 AM   Patient reports not taking any potassium supplements, will repeat bmet in 1 months

## 2023-10-17 NOTE — Telephone Encounter (Signed)
-----   Message from Jayson Sierras sent at 10/17/2023  8:20 AM EDT ----- Results reviewed.  Creatinine 1.51, previously 1.35 (but higher than that prior).  Potassium 5.3.  Would not further increase Kerendia dose based on suggested titration guidelines and current lab  work.  Make sure he is not taking any additional potassium supplements.  Would recheck BMET in 4 weeks. ----- Message ----- From: Rebecka Memos Lab Results In Sent: 10/17/2023   5:38 AM EDT To: Jayson KANDICE Sierras, MD

## 2023-10-19 DIAGNOSIS — R55 Syncope and collapse: Secondary | ICD-10-CM | POA: Diagnosis not present

## 2023-10-21 DIAGNOSIS — R55 Syncope and collapse: Secondary | ICD-10-CM | POA: Diagnosis not present

## 2023-10-24 NOTE — Telephone Encounter (Signed)
 Pt picked up PAP

## 2023-10-27 ENCOUNTER — Other Ambulatory Visit: Payer: Self-pay | Admitting: Family Medicine

## 2023-10-28 DIAGNOSIS — E1165 Type 2 diabetes mellitus with hyperglycemia: Secondary | ICD-10-CM | POA: Diagnosis not present

## 2023-10-30 DIAGNOSIS — R972 Elevated prostate specific antigen [PSA]: Secondary | ICD-10-CM | POA: Diagnosis not present

## 2023-10-30 DIAGNOSIS — R3914 Feeling of incomplete bladder emptying: Secondary | ICD-10-CM | POA: Diagnosis not present

## 2023-10-30 DIAGNOSIS — N5203 Combined arterial insufficiency and corporo-venous occlusive erectile dysfunction: Secondary | ICD-10-CM | POA: Diagnosis not present

## 2023-11-05 ENCOUNTER — Other Ambulatory Visit: Payer: Self-pay | Admitting: Cardiology

## 2023-11-07 ENCOUNTER — Other Ambulatory Visit: Payer: Self-pay | Admitting: Cardiology

## 2023-11-12 DIAGNOSIS — Z79899 Other long term (current) drug therapy: Secondary | ICD-10-CM | POA: Diagnosis not present

## 2023-11-12 NOTE — Telephone Encounter (Signed)
 Pt picked up meds.

## 2023-11-13 LAB — BASIC METABOLIC PANEL WITH GFR
BUN/Creatinine Ratio: 31 — ABNORMAL HIGH (ref 10–24)
BUN: 45 mg/dL — ABNORMAL HIGH (ref 8–27)
CO2: 20 mmol/L (ref 20–29)
Calcium: 9.4 mg/dL (ref 8.6–10.2)
Chloride: 99 mmol/L (ref 96–106)
Creatinine, Ser: 1.43 mg/dL — ABNORMAL HIGH (ref 0.76–1.27)
Glucose: 181 mg/dL — ABNORMAL HIGH (ref 70–99)
Potassium: 4.9 mmol/L (ref 3.5–5.2)
Sodium: 140 mmol/L (ref 134–144)
eGFR: 52 mL/min/1.73 — ABNORMAL LOW (ref >59)

## 2023-11-15 ENCOUNTER — Encounter: Payer: Self-pay | Admitting: Cardiology

## 2023-11-15 ENCOUNTER — Ambulatory Visit: Attending: Cardiology | Admitting: Cardiology

## 2023-11-15 VITALS — BP 132/72 | HR 80 | Ht 64.0 in | Wt 225.2 lb

## 2023-11-15 DIAGNOSIS — E782 Mixed hyperlipidemia: Secondary | ICD-10-CM | POA: Diagnosis not present

## 2023-11-15 DIAGNOSIS — N1831 Chronic kidney disease, stage 3a: Secondary | ICD-10-CM | POA: Diagnosis not present

## 2023-11-15 DIAGNOSIS — I5032 Chronic diastolic (congestive) heart failure: Secondary | ICD-10-CM | POA: Diagnosis not present

## 2023-11-15 DIAGNOSIS — I480 Paroxysmal atrial fibrillation: Secondary | ICD-10-CM

## 2023-11-15 DIAGNOSIS — I25119 Atherosclerotic heart disease of native coronary artery with unspecified angina pectoris: Secondary | ICD-10-CM

## 2023-11-15 NOTE — Progress Notes (Signed)
 Cardiology Office Note  Date: 11/15/2023   ID: Paul Baker, DOB 1950/01/29, MRN [id number]  History of Present Illness: Paul Baker is a 73 y.o. male last seen in September.  He is here for a follow-up visit.  States that he has been feeling better, less short of breath, no recurring chest pain, less sense of fluid retention in his hands and feet.  He tolerated the addition of Kerendia made at last visit.  I went over the results of his cardiac monitor, he did have multiple very brief episodes of PSVT, but importantly no pauses or heart block, nothing that would correlate with syncope.  We discussed his current medications and no changes were made today.  He does have pending follow-up with Pulmonary.  Physical Exam: VS:  BP 132/72 (BP Location: Left Arm)   Pulse 80   Ht 5' 4 (1.626 m)   Wt 225 lb 3.2 oz (102.2 kg)   SpO2 98%   BMI 38.66 kg/m , BMI Body mass index is 38.66 kg/m.  Wt Readings from Last 3 Encounters:  11/15/23 225 lb 3.2 oz (102.2 kg)  10/02/23 221 lb 9.6 oz (100.5 kg)  09/30/23 218 lb 4.1 oz (99 kg)    General: Patient appears comfortable at rest. HEENT: Conjunctiva and lids normal. Neck: Supple, no elevated JVP or carotid bruits. Lungs: No wheezing or rhonchi. Cardiac: Regular rate and rhythm, no S3 or significant systolic murmur. Extremities: No pitting edema.  ECG:  An ECG dated 09/24/2023 was personally reviewed today and demonstrated:  Sinus rhythm.  Labwork: 07/09/2023: TSH 1.960 09/30/2023: ALT 17; AST 11; B Natriuretic Peptide 45.0; Hemoglobin 10.4; Platelets 249 11/12/2023: BUN 45; Creatinine, Ser 1.43; Potassium 4.9; Sodium 140     Component Value Date/Time   CHOL 126 04/26/2023 0845   TRIG 281 (H) 04/26/2023 0845   HDL 36 (L) 04/26/2023 0845   CHOLHDL 3.5 04/26/2023 0845   CHOLHDL 3.2 08/05/2019 0334   VLDL 19 08/05/2019 0334   LDLCALC 47 04/26/2023 0845   LDLCALC 118 (H) 02/06/2019 0813   Other Studies Reviewed  Today:  Cardiac monitor October 2025: ZIO monitor reviewed.  13 days, 7 hours analyzed.   Predominant rhythm is sinus with heart rate ranging from 49 bpm up to 108 bpm and average heart rate 74 bpm. There were rare PACs including atrial couplets and triplets representing less than 1% total beats. There were rare PVCs representing less than 1% total beats. Multiple (38) very brief episodes of SVT were noted, the longest of which was 14 beats.  Probable atrial tachycardia, not atrial fibrillation however.  There were no sustained events. No pauses or high degree heart block.  Assessment and Plan:  1.  CAD, cardiac catheterization in June showed stable 70% stenosis involving a small nondominant RCA with otherwise mild to moderate left system disease that was nonobstructive.  He does report intermittent angina, possibly reflecting microvascular disease in the setting of type 2 diabetes mellitus, some of his symptoms may also be more related to fluid overload intermittently.  At this point clinically stable with no recurring chest pain.  Continue current regimen including Imdur  30 mg daily, Pravachol  40 mg daily, Zetia  10 mg daily, and Ozempic  2 mg weekly.   2.  HFpEF.  LVEF 60 to 65% by echocardiogram in May and RV contraction normal.  Right heart catheterization in June showed PA systolic pressure 41 mmHg with LVEDP 22 mmHg.  Continue Demadex  40 mg twice daily.  He  did not tolerate Doreen previously due to yeast infection.  Fortunately, symptomatically improving since starting Kerendia 10 mg daily, follow-up lab work reviewed.  Currently with NYHA class II dyspnea, less sense of fluid retention.   3.  Episodes of possible recurring syncope although with known diagnosis of narcolepsy. 14-day ZIO monitor did not reveal any obvious etiology of syncope, several very brief episodes of PSVT, but importantly no sustained arrhythmias or pauses.   4.  Paroxysmal atrial fibrillation with CHA2DS2-VASc score of  4.  No reported palpitations.  Continue Toprol -XL 25 mg twice daily and Pradaxa  150 mg twice daily.   5.  Primary hypertension.  Continue Cozaar  50 mg daily.   5.  Mixed hyperlipidemia.  LDL 47 in April.  Continue Pravachol  40 mg daily and Zetia  10 mg daily.   6.  CKD stage IIIa.  Recent creatinine 1.43 with GFR 52 and potassium 4.9.  Disposition:  Follow up 6 months.  Signed, Jayson JUDITHANN Sierras, M.D., F.A.C.C. [facility]

## 2023-11-15 NOTE — Patient Instructions (Signed)
 Medication Instructions:  Your physician recommends that you continue on your current medications as directed. Please refer to the Current Medication list given to you today.   Labwork: None today  Testing/Procedures: None today  Follow-Up: 6 months  Any Other Special Instructions Will Be Listed Below (If Applicable).  If you need a refill on your cardiac medications before your next appointment, please call your pharmacy.

## 2023-11-23 ENCOUNTER — Telehealth: Payer: Self-pay

## 2023-11-23 ENCOUNTER — Ambulatory Visit

## 2023-11-23 VITALS — BP 130/64 | Ht 64.0 in | Wt 223.0 lb

## 2023-11-23 DIAGNOSIS — Z0000 Encounter for general adult medical examination without abnormal findings: Secondary | ICD-10-CM

## 2023-11-23 NOTE — Progress Notes (Cosign Needed Addendum)
 Chief Complaint  Patient presents with   Medicare Wellness     Subjective:   JAQUA CHING is a 73 y.o. male who presents for a Medicare Annual Wellness Visit.  Allergies (verified) Tape, Farxiga [dapagliflozin], Cardizem  [diltiazem  hcl], Cardura [doxazosin mesylate], Crestor  [rosuvastatin ], Lipitor [atorvastatin ], Paxil [paroxetine hcl], Codeine, and Gabapentin   History: Past Medical History:  Diagnosis Date   Allergy    Arthritis    Asthma    Atrial fibrillation [facility])    Diagnosed December 2021   Cataract    Chronic kidney disease    Colon polyp    COPD (chronic obstructive pulmonary disease) (HCC)    Coronary atherosclerosis of native coronary artery    a. 2011: cath showing 90% stenosis along small non-dominant RCA (too small for PCI). b. 01/2018: cath showing nonobstructive CAD with 60 to 70% proximal to mid nondominant RCA stenosis, 50% mid LAD and 40 to 50% OM1   DVT (deep venous thrombosis) (HCC) 2005   Right arm   Essential hypertension    GERD (gastroesophageal reflux disease)    Headache    History of transfusion    Hypothyroidism    Mixed hyperlipidemia    Morbid obesity (HCC)    MRSA (methicillin resistant staph aureus) culture positive    08/2012   Myocardial infarction (HCC)    Narcolepsy    OSA (obstructive sleep apnea)    CPAP   Osteoarthritis    Pneumonia    Psoriasis    Pulmonary embolism (HCC) 2004   RLS (restless legs syndrome)    Rotator cuff disorder    Left   Septic arthritis of knee, left (HCC)    Skin cancer, basal cell    Sleep apnea    Stroke (HCC)    Type 2 diabetes mellitus (HCC)    Past Surgical History:  Procedure Laterality Date   BACK SURGERY     BIOPSY  01/31/2022   Procedure: BIOPSY;  Surgeon: Kriss Estefana DEL, DO;  Location: [facility];  Service: Gastroenterology;;   CATARACT EXTRACTION Bilateral    COLONOSCOPY     COLONOSCOPY WITH PROPOFOL  N/A 01/31/2022   Procedure: COLONOSCOPY WITH PROPOFOL ;  Surgeon:  Kriss Estefana DEL, DO;  Location: [facility];  Service: Gastroenterology;  Laterality: N/A;   CYST REMOVAL TRUNK     from back   ESOPHAGOGASTRODUODENOSCOPY (EGD) WITH PROPOFOL  N/A 01/31/2022   Procedure: ESOPHAGOGASTRODUODENOSCOPY (EGD) WITH PROPOFOL ;  Surgeon: Kriss Estefana DEL, DO;  Location: [facility];  Service: Gastroenterology;  Laterality: N/A;   EYE SURGERY Left 2016   laser to left eye   HIP SURGERY     bone removed from both sides of hip   JOINT REPLACEMENT     KNEE ARTHROTOMY Right 12/04/2014   Procedure: KNEE ARTHROTOMY PATELLA LIGAMENT RECONSTRUSION AND REPAIR RIGHT KNEE;  Surgeon: Donnice Car, MD;  Location: [facility];  Service: Orthopedics;  Laterality: Right;   KNEE SURGERY     X 25 TIMES   LEFT HEART CATH AND CORONARY ANGIOGRAPHY N/A 01/17/2018   Procedure: LEFT HEART CATH AND CORONARY ANGIOGRAPHY;  Surgeon: Claudene Victory ORN, MD;  Location: [facility];  Service: Cardiovascular;  Laterality: N/A;   LUMBAR DISC SURGERY     Left L3, L4, L5 discecotomy with decompression of L4 root   POLYPECTOMY  01/31/2022   Procedure: POLYPECTOMY;  Surgeon: Kriss Estefana DEL, DO;  Location: [facility];  Service: Gastroenterology;;   RIGHT/LEFT HEART CATH AND CORONARY ANGIOGRAPHY N/A 07/02/2023   Procedure: RIGHT/LEFT  HEART CATH AND CORONARY ANGIOGRAPHY;  Surgeon: Mady Bruckner, MD;  Location: [facility];  Service: Cardiovascular;  Laterality: N/A;   SPINE SURGERY     TONSILLECTOMY     TOTAL KNEE ARTHROPLASTY  2003   LEFT   TOTAL KNEE ARTHROPLASTY Right 03/23/2014   Procedure: RIGHT TOTAL KNEE ARTHROPLASTY AND REMOVAL RIGHT TIBIAL  DEEP IMPLANT STAPLE;  Surgeon: Donnice Car, MD;  Location: [facility];  Service: Orthopedics;  Laterality: Right;   TOTAL KNEE REVISION  2005   LEFT   WRIST SURGERY     Family History  Problem Relation Age of Onset   Hypertension Father    Heart attack Father    Kidney Stones Father    Arthritis Father    Kidney disease Father     Stroke Father    Cancer Mother    Seizures Grandchild    Narcolepsy Grandchild    Diabetes Sister    Cancer Sister    Heart disease Maternal Grandfather    Social History   Occupational History   Occupation: Disabled    Employer: UNEMPLOYED  Tobacco Use   Smoking status: Former    Current packs/day: 0.00    Average packs/day: 1.5 packs/day for 27.5 years (41.3 ttl pk-yrs)    Types: Cigarettes    Start date: 06/19/1967    Quit date: 01/03/1995    Years since quitting: 28.9    Passive exposure: Never   Smokeless tobacco: Never  Vaping Use   Vaping status: Never Used  Substance and Sexual Activity   Alcohol  use: No    Alcohol /week: 0.0 standard drinks of alcohol     Comment: quit drinking in 07/86   Drug use: No   Sexual activity: Yes    Partners: Female   Tobacco Counseling Counseling given: Not Answered  SDOH Screenings   Food Insecurity: No Food Insecurity (11/23/2023)  Housing: Low Risk  (11/23/2023)  Transportation Needs: No Transportation Needs (11/23/2023)  Utilities: Not At Risk (11/23/2023)  Alcohol  Screen: Low Risk  (05/05/2022)  Depression (PHQ2-9): Medium Risk (11/23/2023)  Financial Resource Strain: Low Risk  (05/05/2022)  Physical Activity: Insufficiently Active (11/23/2023)  Social Connections: Socially Integrated (11/23/2023)  Stress: No Stress Concern Present (11/23/2023)  Tobacco Use: Medium Risk (11/23/2023)  Health Literacy: Adequate Health Literacy (11/23/2023)   See flowsheets for full screening details  Depression Screen PHQ 2 & 9 Depression Scale- Over the past 2 weeks, how often have you been bothered by any of the following problems? Little interest or pleasure in doing things: 0 Feeling down, depressed, or hopeless (PHQ Adolescent also includes...irritable): 0 PHQ-2 Total Score: 0 Trouble falling or staying asleep, or sleeping too much: 0 Feeling tired or having little energy: 2 Poor appetite or overeating (PHQ Adolescent also  includes...weight loss): 2 Feeling bad about yourself - or that you are a failure or have let yourself or your family down: 0 Trouble concentrating on things, such as reading the newspaper or watching television (PHQ Adolescent also includes...like school work): 1 Moving or speaking so slowly that other people could have noticed. Or the opposite - being so fidgety or restless that you have been moving around a lot more than usual: 0 Thoughts that you would be better off dead, or of hurting yourself in some way: 0 PHQ-9 Total Score: 5 If you checked off any problems, how difficult have these problems made it for you to do your work, take care of things at home, or get along with other people?: Not  difficult at all     Goals Addressed             This Visit's Progress    Remain active and independent   On track      Visit info / Clinical Intake: Medicare Wellness Visit Type:: Subsequent Annual Wellness Visit Persons participating in visit:: patient Medicare Wellness Visit Mode:: In-person (required for WTM) Information given by:: patient Interpreter Needed?: No Pre-visit prep was completed: yes AWV questionnaire completed by patient prior to visit?: no Living arrangements:: lives with spouse/significant other Patient's Overall Health Status Rating: good Typical amount of pain: some Does pain affect daily life?: no Are you currently prescribed opioids?: no  Dietary Habits and Nutritional Risks How many meals a day?: 3 Eats fruit and vegetables daily?: yes Most meals are obtained by: having others provide food Diabetic:: (!) yes Any non-healing wounds?: (!) yes How often do you check your BS?: continuous glucose monitor Would you like to be referred to a Nutritionist or for Diabetic Management? : no  Functional Status Activities of Daily Living (to include ambulation/medication): Independent Ambulation: Independent Medication Administration: Independent Home Management:  Independent Manage your own finances?: yes Primary transportation is: family/friends Concerns about vision?: (!) yes (plans to schedule diabetic eye exam soon) Concerns about hearing?: no  Fall Screening Falls in the past year?: 1 Number of falls in past year: 0 Was there an injury with Fall?: 0 Fall Risk Category Calculator: 1 Patient Fall Risk Level: Low Fall Risk  Fall Risk Patient at Risk for Falls Due to: No Fall Risks Fall risk Follow up: Falls evaluation completed; Education provided; Falls prevention discussed  Home and Transportation Safety: All rugs have non-skid backing?: yes All stairs or steps have railings?: yes Grab bars in the bathtub or shower?: yes Have non-skid surface in bathtub or shower?: yes Good home lighting?: yes Regular seat belt use?: yes Hospital stays in the last year:: no  Cognitive Assessment Difficulty concentrating, remembering, or making decisions? : no Will 6CIT or Mini Cog be Completed: no 6CIT or Mini Cog Declined: patient alert, oriented, able to answer questions appropriately and recall recent events  Advance Directives (For Healthcare) Does Patient Have a Medical Advance Directive?: No Type of Advance Directive: Healthcare Power of [place]; Living will Copy of Healthcare Power of Attorney in Chart?: Yes - validated most recent copy scanned in chart (See row information) Would patient like information on creating a medical advance directive?: Yes (MAU/Ambulatory/Procedural Areas - Information given)  Reviewed/Updated  Reviewed/Updated: Reviewed All (Medical, Surgical, Family, Medications, Allergies, Care Teams, Patient Goals)        Objective:    Today's Vitals   11/23/23 0833  BP: 130/64  Weight: 223 lb (101.2 kg)  Height: 5' 4 (1.626 m)   Body mass index is 38.28 kg/m.  Current Medications (verified) Outpatient Encounter Medications as of 11/23/2023  Medication Sig   acetaminophen  (TYLENOL ) 325 MG tablet Take 2  tablets (650 mg total) by mouth every 6 (six) hours as needed for mild pain (or Fever >/= 101).   albuterol  (PROVENTIL ) (2.5 MG/3ML) 0.083% nebulizer solution Take 3 mLs (2.5 mg total) by nebulization every 6 (six) hours as needed for up to 7 days for wheezing or shortness of breath.   albuterol  (VENTOLIN  HFA) 108 (90 Base) MCG/ACT inhaler Inhale 2 puffs into the lungs every 6 (six) hours as needed for wheezing or shortness of breath.   alfuzosin  (UROXATRAL ) 10 MG 24 hr tablet Take 10 mg by mouth daily.  Alpha Lipoic Acid -Biotin  ER 600-450 MG-MCG TBCR Take 600 mg by mouth every morning.   clobetasol  cream (TEMOVATE ) 0.05 % Apply thin amount 2 times daily prn to psoriasis not for face axilla or groin   Continuous Glucose Sensor (DEXCOM G7 SENSOR) MISC Change sensor every 10 days   Cyanocobalamin  (VITAMIN B 12 PO) Take 5,000 mcg by mouth daily.   dabigatran  (PRADAXA ) 150 MG CAPS capsule Take 1 capsule (150 mg total) by mouth 2 (two) times daily.   desonide  (DESOWEN ) 0.05 % cream Apply once daily prn to facial rash   dextromethorphan -guaiFENesin  (MUCINEX  DM) 30-600 MG 12hr tablet Take 1 tablet by mouth 2 (two) times daily.   ezetimibe  (ZETIA ) 10 MG tablet Take 10 mg by mouth daily.   FIASP  FLEXTOUCH 100 UNIT/ML FlexTouch Pen Inject 30-36 Units into the skin in the morning, at noon, and at bedtime.   Finerenone  (KERENDIA ) 10 MG TABS Take 1 tablet (10 mg total) by mouth daily.   fluticasone  (FLONASE ) 50 MCG/ACT nasal spray Place 1 spray into both nostrils daily.   Fluticasone -Umeclidin-Vilant (TRELEGY ELLIPTA ) 100-62.5-25 MCG/ACT AEPB Inhale 1 puff into the lungs daily.   Glucagon  (GVOKE HYPOPEN  2-PACK) 1 MG/0.2ML SOAJ Inject 1 mg subcutaneously once as needed for low blood sugar. May repeat dose in 15 minutes as needed using a new device.   insulin  degludec (TRESIBA  FLEXTOUCH) 200 UNIT/ML FlexTouch Pen Inject 120 Units into the skin at bedtime.   Insulin  Pen Needle (B-D ULTRAFINE III SHORT PEN) 31G X  8 MM MISC USE 1 PEN NEEDLE THREE TIMES DAILY   isosorbide  mononitrate (IMDUR ) 30 MG 24 hr tablet Take 1 tablet by mouth once daily   levothyroxine  (SYNTHROID ) 137 MCG tablet Take 1 tablet (137 mcg total) by mouth daily before breakfast.   loratadine  (CLARITIN ) 10 MG tablet Take 10 mg by mouth daily as needed for allergies.    losartan  (COZAAR ) 50 MG tablet Take 50 mg by mouth daily.   metoprolol  succinate (TOPROL -XL) 25 MG 24 hr tablet Take 1 tablet (25 mg total) by mouth 2 (two) times daily.   metoprolol  tartrate (LOPRESSOR ) 25 MG tablet Take 25 mg by mouth daily as needed (palpitations).   modafinil  (PROVIGIL ) 200 MG tablet Take 1 tablet (200 mg total) by mouth daily.   nitroGLYCERIN  (NITROSTAT ) 0.4 MG SL tablet DISSOLVE 1 TABLET UNDER TONGUE EVERY 5 MINUTES UP TO 15 MIN FOR CHESTPAIN. IF NO RELIEF CALL 911.   ONE TOUCH ULTRA TEST test strip TEST BLOOD SUGAR UP TO 4 TIMES DAILY.   ONETOUCH DELICA LANCETS 33G MISC USE AS DIRECTED TO TEST BLOOD SUGAR 4 TIMES DAILY.   pravastatin  (PRAVACHOL ) 40 MG tablet TAKE 1 TABLET BY MOUTH ONCE DAILY IN THE EVENING   rOPINIRole  (REQUIP  XL) 4 MG 24 hr tablet Take 1 tablet (4 mg total) by mouth at bedtime.   rOPINIRole  (REQUIP ) 4 MG tablet Take 1 tablet (4 mg total) by mouth at bedtime.   Semaglutide  (OZEMPIC , 2 MG/DOSE, [place]) Inject into the skin.   torsemide  (DEMADEX ) 20 MG tablet Take 2 tablets (40 mg total) by mouth 2 (two) times daily.   benzonatate  (TESSALON ) 200 MG capsule Take 1 capsule (200 mg total) by mouth 3 (three) times daily as needed. (Patient not taking: Reported on 11/23/2023)   magnesium  30 MG tablet Take 30 mg by mouth daily. (Patient not taking: Reported on 11/23/2023)   No facility-administered encounter medications on file as of 11/23/2023.   Hearing/Vision screen Hearing Screening - Comments:: Patient is  able to hear conversational tones without difficulty. No issues reported.   Vision Screening - Comments:: Patient states that he can  tell when blood sugar changes because vision becomes very blurred; plans to schedule diabetic eye exam soon  Immunizations and Health Maintenance Health Maintenance  Topic Date Due   Hepatitis C Screening  Never done   Zoster Vaccines- Shingrix (1 of 2) Never done   COVID-19 Vaccine (3 - Pfizer risk series) 04/24/2019   OPHTHALMOLOGY EXAM  12/20/2022   Influenza Vaccine  04/01/2024 (Originally 08/03/2023)   HEMOGLOBIN A1C  02/09/2024   FOOT EXAM  04/08/2024   Diabetic kidney evaluation - Urine ACR  04/25/2024   Diabetic kidney evaluation - eGFR measurement  11/11/2024   Medicare Annual Wellness (AWV)  11/22/2024   Colonoscopy  02/01/2027   DTaP/Tdap/Td (3 - Td or Tdap) 08/13/2033   Pneumococcal Vaccine: 50+ Years  Completed   Meningococcal B Vaccine  Aged Out        Assessment/Plan:  This is a routine wellness examination for Nole.  Patient Care Team: Alphonsa Glendia LABOR, MD as PCP - General (Family Medicine) Debera Jayson MATSU, MD as PCP - Cardiology (Cardiology) Dohmeier, Dedra, MD as Consulting Physician (Neurology) [facility], [facility]  Therisa Benton PARAS, NP as Nurse Practitioner (Nurse Practitioner) Parrett, Madelin RAMAN, NP as Nurse Practitioner (Pulmonary Disease) Geofm Delon BRAVO, NP as Nurse Practitioner (Nurse Practitioner) Pa, [facility]  I have personally reviewed and noted the following in the patient's chart:   Medical and social history Use of alcohol , tobacco or illicit drugs  Current medications and supplements including opioid prescriptions. Functional ability and status Nutritional status Physical activity Advanced directives List of other physicians Hospitalizations, surgeries, and ER visits in previous 12 months Vitals Screenings to include cognitive, depression, and falls Referrals and appointments  No orders of the defined types were placed in this encounter.  In addition, I have reviewed and discussed with  patient certain preventive protocols, quality metrics, and best practice recommendations. A written personalized care plan for preventive services as well as general preventive health recommendations were provided to patient.   Lavelle Charmaine Browner, LPN   [id number]   Return in 1 year (on 11/22/2024).  After Visit Summary: (In Person-Printed) AVS printed and given to the patient  Nurse Notes: See telephone note with patient concerns

## 2023-11-23 NOTE — Telephone Encounter (Signed)
 Nurses-please talk with patient- Certainly we are concerned about his blood sugars they are on the significantly elevated range It would be more than likely that his endocrinologist will increase his insulin  to try to get things under better control.  We will pass this message along to endocrinology as well but it is quite possible that they will not want to try to do additional interventions until their office visit.  It would be reasonable for patient to also send the endocrinologist a message regarding his sugars.  As for the abrasion on the leg if it is severely infected he may need to go to the urgent care today but if it is not infected I would recommend an office visit next week to recheck Often when a area will not heal it is related to high sugars as well and sometimes infection can make it difficult to heal

## 2023-11-23 NOTE — Telephone Encounter (Signed)
 Patient seen for AWV and has concerns with his blood sugars.  States that his readings are fluctuating as high as 400s.  States that when it becomes that high that he has distortion with his vision. He also has an abrasion on his left leg that will not completely heal.  No signs of infection area is red and scabbed over with no drainage.   In the last month he has also started to experience some sensations in arms and chest that feels like needle pricking and is primarily at night.    Patient would like to receive a call with any recommendations.

## 2023-11-23 NOTE — Patient Instructions (Addendum)
 Mr. Awan,  Thank you for taking the time for your Medicare Wellness Visit. I appreciate your continued commitment to your health goals. Please review the care plan we discussed, and feel free to reach out if I can assist you further.  Please note that Annual Wellness Visits do not include a physical exam. Some assessments may be limited, especially if the visit was conducted virtually. If needed, we may recommend an in-person follow-up with your provider.  Ongoing Care Seeing your primary care provider every 3 to 6 months helps us  monitor your health and provide consistent, personalized care.   Referrals If a referral was made during today's visit and you haven't received any updates within two weeks, please contact the referred provider directly to check on the status.  Recommended Screenings:  Health Maintenance  Topic Date Due   Hepatitis C Screening  Never done   Zoster (Shingles) Vaccine (1 of 2) Never done   COVID-19 Vaccine (3 - Pfizer risk series) 04/24/2019   Eye exam for diabetics  12/20/2022   Flu Shot  04/01/2024*   Hemoglobin A1C  02/09/2024   Complete foot exam   04/08/2024   Yearly kidney health urinalysis for diabetes  04/25/2024   Yearly kidney function blood test for diabetes  11/11/2024   Medicare Annual Wellness Visit  11/22/2024   Colon Cancer Screening  02/01/2027   DTaP/Tdap/Td vaccine (3 - Td or Tdap) 08/13/2033   Pneumococcal Vaccine for age over 91  Completed   Meningitis B Vaccine  Aged Out  *Topic was postponed. The date shown is not the original due date.       09/30/2023    9:44 AM  Advanced Directives  Does Patient Have a Medical Advance Directive? Yes  Type of Estate Agent of [place];Living will  Copy of Healthcare Power of Attorney in Chart? Yes - validated most recent copy scanned in chart (See row information)   Information on Advanced Care Planning can be found at [place]  Secretary of [facility] Advance Health  Care Directives Advance Health Care Directives ([email or web address]   Vision: Annual vision screenings are recommended for early detection of glaucoma, cataracts, and diabetic retinopathy. These exams can also reveal signs of chronic conditions such as diabetes and high blood pressure.  Dental: Annual dental screenings help detect early signs of oral cancer, gum disease, and other conditions linked to overall health, including heart disease and diabetes.  Please see the attached documents for additional preventive care recommendations.

## 2023-11-26 NOTE — Telephone Encounter (Signed)
 I got this message from his PCP office regarding concerns for his glucose.  I pulled his CGM data and overall not too bad.  He has spikes but usually following a low glucose (likely from over-correcting).  Please verify his current medications and dosages.  Also encourage him to treat a low glucose with 15 grams of sugar (like 3 peanut butter crackers) and wait 15 minutes to see if his glucose starts going back up.  If not, eat another 3 and wait another 15 minutes.  This will help him from over-correcting.

## 2023-11-26 NOTE — Telephone Encounter (Signed)
 Patient was called and a voicemail was left on his voicemail. Ask that he call our office back.

## 2023-11-28 DIAGNOSIS — E1165 Type 2 diabetes mellitus with hyperglycemia: Secondary | ICD-10-CM | POA: Diagnosis not present

## 2023-12-10 ENCOUNTER — Ambulatory Visit: Admitting: Nurse Practitioner

## 2023-12-13 ENCOUNTER — Telehealth: Payer: Self-pay | Admitting: Nurse Practitioner

## 2023-12-13 NOTE — Telephone Encounter (Signed)
 Sent pt mychart message explaining situation

## 2023-12-13 NOTE — Telephone Encounter (Signed)
 [employer] states pt will not be able to receive any CGM supplies until seen in office, he canceled appt on 12/10/23 and is scheduled for your next available of 03/05/24

## 2023-12-13 NOTE — Telephone Encounter (Signed)
 He can come get G7 sensor samples (only a few at a time) until then.  OR he can revert back to traditional fingersticks.

## 2023-12-21 ENCOUNTER — Other Ambulatory Visit: Payer: Self-pay | Admitting: Neurology

## 2023-12-21 DIAGNOSIS — G2581 Restless legs syndrome: Secondary | ICD-10-CM

## 2023-12-21 NOTE — Telephone Encounter (Signed)
 LVM for patient to call back and discuss medication refill request

## 2023-12-25 NOTE — Telephone Encounter (Signed)
 Spoke to patient still taking requip  . Sent refill to pharmacy this afternoon Pt was very appreciative.

## 2023-12-25 NOTE — Telephone Encounter (Signed)
 Pt called wanting to know when this will be filled for him. Please advise.

## 2023-12-28 DIAGNOSIS — E1165 Type 2 diabetes mellitus with hyperglycemia: Secondary | ICD-10-CM | POA: Diagnosis not present

## 2023-12-29 ENCOUNTER — Other Ambulatory Visit: Payer: Self-pay | Admitting: Family Medicine

## 2023-12-29 DIAGNOSIS — E782 Mixed hyperlipidemia: Secondary | ICD-10-CM

## 2024-01-04 ENCOUNTER — Encounter: Payer: Self-pay | Admitting: Oncology

## 2024-01-15 ENCOUNTER — Other Ambulatory Visit: Payer: Self-pay | Admitting: Cardiology

## 2024-01-23 ENCOUNTER — Encounter: Payer: Self-pay | Admitting: Oncology

## 2024-01-23 ENCOUNTER — Other Ambulatory Visit: Payer: Self-pay | Admitting: Family Medicine

## 2024-01-30 ENCOUNTER — Inpatient Hospital Stay: Payer: Self-pay | Attending: Oncology

## 2024-01-30 ENCOUNTER — Encounter: Payer: Self-pay | Admitting: Oncology

## 2024-01-30 DIAGNOSIS — D649 Anemia, unspecified: Secondary | ICD-10-CM

## 2024-01-30 LAB — IRON AND TIBC
Iron: 48 ug/dL (ref 45–182)
Saturation Ratios: 17 % — ABNORMAL LOW (ref 17.9–39.5)
TIBC: 279 ug/dL (ref 250–450)
UIBC: 231 ug/dL

## 2024-01-30 LAB — CBC WITH DIFFERENTIAL/PLATELET
Abs Immature Granulocytes: 0.05 10*3/uL (ref 0.00–0.07)
Basophils Absolute: 0.1 10*3/uL (ref 0.0–0.1)
Basophils Relative: 1 %
Eosinophils Absolute: 0.3 10*3/uL (ref 0.0–0.5)
Eosinophils Relative: 4 %
HCT: 32.9 % — ABNORMAL LOW (ref 39.0–52.0)
Hemoglobin: 10.3 g/dL — ABNORMAL LOW (ref 13.0–17.0)
Immature Granulocytes: 1 %
Lymphocytes Relative: 16 %
Lymphs Abs: 1.4 10*3/uL (ref 0.7–4.0)
MCH: 29.6 pg (ref 26.0–34.0)
MCHC: 31.3 g/dL (ref 30.0–36.0)
MCV: 94.5 fL (ref 80.0–100.0)
Monocytes Absolute: 1 10*3/uL (ref 0.1–1.0)
Monocytes Relative: 11 %
Neutro Abs: 6.3 10*3/uL (ref 1.7–7.7)
Neutrophils Relative %: 67 %
Platelets: 226 10*3/uL (ref 150–400)
RBC: 3.48 MIL/uL — ABNORMAL LOW (ref 4.22–5.81)
RDW: 14.5 % (ref 11.5–15.5)
WBC: 9.2 10*3/uL (ref 4.0–10.5)
nRBC: 0 % (ref 0.0–0.2)

## 2024-01-30 LAB — COMPREHENSIVE METABOLIC PANEL WITH GFR
ALT: 14 U/L (ref 0–44)
AST: 21 U/L (ref 15–41)
Albumin: 4.1 g/dL (ref 3.5–5.0)
Alkaline Phosphatase: 68 U/L (ref 38–126)
Anion gap: 15 (ref 5–15)
BUN: 43 mg/dL — ABNORMAL HIGH (ref 8–23)
CO2: 20 mmol/L — ABNORMAL LOW (ref 22–32)
Calcium: 8.9 mg/dL (ref 8.9–10.3)
Chloride: 108 mmol/L (ref 98–111)
Creatinine, Ser: 1.31 mg/dL — ABNORMAL HIGH (ref 0.61–1.24)
GFR, Estimated: 58 mL/min — ABNORMAL LOW
Glucose, Bld: 168 mg/dL — ABNORMAL HIGH (ref 70–99)
Potassium: 4.6 mmol/L (ref 3.5–5.1)
Sodium: 143 mmol/L (ref 135–145)
Total Bilirubin: <0.2 mg/dL (ref 0.0–1.2)
Total Protein: 6.9 g/dL (ref 6.5–8.1)

## 2024-01-30 LAB — FERRITIN: Ferritin: 269 ng/mL (ref 24–336)

## 2024-02-06 ENCOUNTER — Inpatient Hospital Stay: Payer: Self-pay | Admitting: Oncology

## 2024-02-06 VITALS — BP 125/65 | HR 79 | Temp 97.5°F | Resp 18 | Ht 64.5 in | Wt 221.0 lb

## 2024-02-06 DIAGNOSIS — R5382 Chronic fatigue, unspecified: Secondary | ICD-10-CM | POA: Diagnosis not present

## 2024-02-06 DIAGNOSIS — N189 Chronic kidney disease, unspecified: Secondary | ICD-10-CM

## 2024-02-06 DIAGNOSIS — R7689 Other specified abnormal immunological findings in serum: Secondary | ICD-10-CM

## 2024-02-06 DIAGNOSIS — D649 Anemia, unspecified: Secondary | ICD-10-CM | POA: Diagnosis not present

## 2024-02-06 NOTE — Assessment & Plan Note (Addendum)
-   Etiology likely from both iron  deficiency and CKD. -Labs from 01/30/2024 show iron  saturations of 17%, ferritin 269 and hemoglobin 10.3.  Creatinine 1.31 which is baseline. -He received 2 doses of 300 mg IV Venofer  on 02/05/2023 and 02/12/2023. -Previously have ruled out nutritional deficiencies and hemolysis.  Stool occult negative. -EGD/colonoscopy on 01/31/2022 did not reveal any evidence of bleeding. -Hemoglobin remains low and iron  saturations have dropped.  Would recommend 2 additional doses of 300 mg IV Venofer . -If hemoglobin remains below 10 with adequate iron  storage saturations between 25 and 30%, would recommend erythropoietin stimulating agent such as Retacrit. -He currently not on iron  supplements.  Reports they cause constipation and GI upset.  He is on vitamin B12 and vitamin D supplements. -Return to clinic in 3 months with labs a few days before for follow-up.

## 2024-02-06 NOTE — Assessment & Plan Note (Addendum)
-  He is followed closely by pulmonology and cardiology. -He had a recent echocardiogram with fairly unremarkable results.  EF was 60 to 65%. -Had recent heart catheterization with Dr. Mady which was more or less stable.  Torsemide  was increased to 60 mg twice daily.

## 2024-02-06 NOTE — Progress Notes (Signed)
 "  [facility] OFFICE PROGRESS NOTE  Alphonsa Glendia LABOR, MD  ASSESSMENT & PLAN:   Assessment & Plan Normocytic anemia - Etiology likely from both iron  deficiency and CKD. -Labs from 01/30/2024 show iron  saturations of 17%, ferritin 269 and hemoglobin 10.3.  Creatinine 1.31 which is baseline. -He received 2 doses of 300 mg IV Venofer  on 02/05/2023 and 02/12/2023. -Previously have ruled out nutritional deficiencies and hemolysis.  Stool occult negative. -EGD/colonoscopy on 01/31/2022 did not reveal any evidence of bleeding. -Hemoglobin remains low and iron  saturations have dropped.  Would recommend 2 additional doses of 300 mg IV Venofer . -If hemoglobin remains below 10 with adequate iron  storage saturations between 25 and 30%, would recommend erythropoietin stimulating agent such as Retacrit. -He currently not on iron  supplements.  Reports they cause constipation and GI upset.  He is on vitamin B12 and vitamin D supplements. -Return to clinic in 3 months with labs a few days before for follow-up. Chronic fatigue -He is followed closely by pulmonology and cardiology. -He had a recent echocardiogram with fairly unremarkable results.  EF was 60 to 65%. -Had recent heart catheterization with Dr. Mady which was more or less stable.  Torsemide  was increased to 60 mg twice daily.  Chronic kidney disease, unspecified CKD stage Creatinine baseline is from 1.45-2.04 over the past year.  Followed by Dr. Jaci for kidneys.  Most recent creatinine 1.31 with GFR 58. Elevated serum immunoglobulin free light chain level - Kappa light chains most likely elevated from mild CKD. - Hematology workup from 04/21/2022 showed elevation of light chain with normal ratio.  24-hour UPEP did not reveal monoclonal protein. -Elevated serum free light chain is from inflammation versus kidney dysfunction. -No additional workup is needed at this time.  No orders of the defined types were placed in this  encounter.   INTERVAL HISTORY: Patient returns for follow-up for IDA status post IV iron  in the past.  He has history of CKD followed by Dr. Prescilla, CHF and CAD followed by Dr. Mady and OSA.  Since his last visit with us , patient was seen in the ED for diarrhea on 09/19/2023 and hyperglycemia on 09/30/2023.  He also met with various providers for chronic follow-up including cardiology and primary care provider.  Reports he has persistent chronic cough and shortness of breath with occasional chest pain and palpitations in the wintertime.  Reports it makes it tough for him to breathe especially when he is outside.  Reports feeling very tired and has dizzy spells.  Reports he could be laying down with a dizzy spell and he feels like he is drunk.  Reports new onset itching in his lower extremities and occasionally and on his arms.  No identifiable rash.  No flushing.  Reports appetite of 65% with very low energy levels.  Has chronic stable arthritis and rates it a 6 out of 10.  Not currently on iron  supplements. Taking B12, magnesium  and valproic acid with biotin  and vitamin D.   We reviewed iron  panel, CMP, ferritin and CBC.  SUMMARY OF HEMATOLOGIC HISTORY: Oncology History   No problem history exists.     CBC    Component Value Date/Time   WBC 9.2 01/30/2024 1015   RBC 3.48 (L) 01/30/2024 1015   HGB 10.3 (L) 01/30/2024 1015   HGB 10.5 (L) 06/21/2023 1037   HCT 32.9 (L) 01/30/2024 1015   HCT 32.8 (L) 06/21/2023 1037   PLT 226 01/30/2024 1015   PLT 199 06/21/2023 1037  MCV 94.5 01/30/2024 1015   MCV 93 06/21/2023 1037   MCH 29.6 01/30/2024 1015   MCHC 31.3 01/30/2024 1015   RDW 14.5 01/30/2024 1015   RDW 14.0 06/21/2023 1037   LYMPHSABS 1.4 01/30/2024 1015   LYMPHSABS 1.6 09/01/2021 1506   MONOABS 1.0 01/30/2024 1015   EOSABS 0.3 01/30/2024 1015   EOSABS 0.3 09/01/2021 1506   BASOSABS 0.1 01/30/2024 1015   BASOSABS 0.1 09/01/2021 1506       Latest Ref Rng & Units  01/30/2024   10:15 AM 11/12/2023   11:25 AM 10/16/2023   10:50 AM  CMP  Glucose 70 - 99 mg/dL 831  818  762   BUN 8 - 23 mg/dL 43  45  43   Creatinine 0.61 - 1.24 mg/dL 8.68  8.56  8.48   Sodium 135 - 145 mmol/L 143  140  138   Potassium 3.5 - 5.1 mmol/L 4.6  4.9  5.3   Chloride 98 - 111 mmol/L 108  99  102   CO2 22 - 32 mmol/L 20  20  21    Calcium  8.9 - 10.3 mg/dL 8.9  9.4  9.1   Total Protein 6.5 - 8.1 g/dL 6.9     Total Bilirubin 0.0 - 1.2 mg/dL <9.7     Alkaline Phos 38 - 126 U/L 68     AST 15 - 41 U/L 21     ALT 0 - 44 U/L 14        Lab Results  Component Value Date   FERRITIN 269 01/30/2024   VITAMINB12 395 04/26/2023    There were no vitals filed for this visit.  Review of System:  Review of Systems  Constitutional:  Positive for malaise/fatigue.  Respiratory:  Positive for cough and shortness of breath.   Cardiovascular:  Positive for chest pain and palpitations.  Genitourinary:  Positive for frequency and urgency.  Neurological:  Positive for dizziness, tingling and headaches.  Psychiatric/Behavioral:  The patient has insomnia.     Physical Exam: Physical Exam Constitutional:      Appearance: Normal appearance.  HENT:     Head: Normocephalic and atraumatic.  Eyes:     Pupils: Pupils are equal, round, and reactive to light.  Cardiovascular:     Rate and Rhythm: Normal rate and regular rhythm.     Heart sounds: Normal heart sounds. No murmur heard. Pulmonary:     Effort: Pulmonary effort is normal.     Breath sounds: Normal breath sounds. No wheezing.  Abdominal:     General: Bowel sounds are normal. There is no distension.     Palpations: Abdomen is soft.     Tenderness: There is no abdominal tenderness.  Musculoskeletal:        General: Normal range of motion.     Cervical back: Normal range of motion.  Skin:    General: Skin is warm and dry.     Findings: No rash.  Neurological:     Mental Status: He is alert and oriented to person, place, and  time.     Gait: Gait is intact.  Psychiatric:        Mood and Affect: Mood and affect normal.        Cognition and Memory: Memory normal.        Judgment: Judgment normal.      I spent 25 minutes dedicated to the care of this patient (face-to-face and non-face-to-face) on the date of the encounter to include what  is described in the assessment and plan.  Delon Hope, AGNP-C Department of Hematology/Oncology [facility]  Phone: [phone]  02/06/2024 1:05 PM  "

## 2024-02-06 NOTE — Assessment & Plan Note (Addendum)
 Creatinine baseline is from 1.45-2.04 over the past year.  Followed by Dr. Jaci for kidneys.  Most recent creatinine 1.31 with GFR 58.

## 2024-02-06 NOTE — Assessment & Plan Note (Addendum)
-   Kappa light chains most likely elevated from mild CKD. - Hematology workup from 04/21/2022 showed elevation of light chain with normal ratio.  24-hour UPEP did not reveal monoclonal protein. -Elevated serum free light chain is from inflammation versus kidney dysfunction. -No additional workup is needed at this time.

## 2024-02-07 ENCOUNTER — Encounter: Payer: Self-pay | Admitting: Oncology

## 2024-02-07 ENCOUNTER — Encounter: Payer: Self-pay | Admitting: *Deleted

## 2024-02-07 NOTE — Progress Notes (Signed)
 Paul Baker                                          MRN: [id number]   02/07/2024   The [facility] Quality Team Specialist reviewed this patient medical record for the purposes of chart review for care gap closure. The following were reviewed: abstraction for care gap closure-diabetic eye exam.    [facility] Quality Team

## 2024-02-07 NOTE — Progress Notes (Deleted)
 Paul Baker                                          MRN: [id number]   02/07/2024   The [facility] Quality Team Specialist reviewed this patient medical record for the purposes of chart review for care gap closure. The following were reviewed: chart review for care gap closure-diabetic eye exam.    [facility] Quality Team

## 2024-02-11 ENCOUNTER — Inpatient Hospital Stay

## 2024-02-18 ENCOUNTER — Inpatient Hospital Stay

## 2024-03-05 ENCOUNTER — Ambulatory Visit: Admitting: Nurse Practitioner

## 2024-03-18 ENCOUNTER — Ambulatory Visit: Admitting: Adult Health

## 2024-04-15 ENCOUNTER — Ambulatory Visit: Admitting: Family Medicine

## 2024-05-05 ENCOUNTER — Inpatient Hospital Stay

## 2024-05-08 ENCOUNTER — Inpatient Hospital Stay: Admitting: Oncology
